# Patient Record
Sex: Female | Born: 1955
Health system: Southern US, Community
[De-identification: ages and names within clinical notes are randomized; demographics above are authoritative.]

## PROBLEM LIST (undated history)

## (undated) DIAGNOSIS — I2510 Atherosclerotic heart disease of native coronary artery without angina pectoris: Secondary | ICD-10-CM

## (undated) DIAGNOSIS — K648 Other hemorrhoids: Secondary | ICD-10-CM

## (undated) DIAGNOSIS — Q2381 Bicuspid aortic valve: Secondary | ICD-10-CM

## (undated) DIAGNOSIS — G43909 Migraine, unspecified, not intractable, without status migrainosus: Secondary | ICD-10-CM

## (undated) DIAGNOSIS — K828 Other specified diseases of gallbladder: Secondary | ICD-10-CM

## (undated) DIAGNOSIS — G4733 Obstructive sleep apnea (adult) (pediatric): Secondary | ICD-10-CM

## (undated) DIAGNOSIS — K0889 Other specified disorders of teeth and supporting structures: Secondary | ICD-10-CM

## (undated) DIAGNOSIS — T7840XA Allergy, unspecified, initial encounter: Secondary | ICD-10-CM

## (undated) DIAGNOSIS — J189 Pneumonia, unspecified organism: Secondary | ICD-10-CM

## (undated) DIAGNOSIS — I714 Abdominal aortic aneurysm, without rupture, unspecified: Secondary | ICD-10-CM

## (undated) DIAGNOSIS — N289 Disorder of kidney and ureter, unspecified: Secondary | ICD-10-CM

## (undated) DIAGNOSIS — F329 Major depressive disorder, single episode, unspecified: Secondary | ICD-10-CM

## (undated) DIAGNOSIS — R002 Palpitations: Secondary | ICD-10-CM

## (undated) DIAGNOSIS — I38 Endocarditis, valve unspecified: Secondary | ICD-10-CM

## (undated) DIAGNOSIS — J45909 Unspecified asthma, uncomplicated: Secondary | ICD-10-CM

## (undated) DIAGNOSIS — M1990 Unspecified osteoarthritis, unspecified site: Secondary | ICD-10-CM

## (undated) DIAGNOSIS — I5042 Chronic combined systolic (congestive) and diastolic (congestive) heart failure: Secondary | ICD-10-CM

## (undated) DIAGNOSIS — I119 Hypertensive heart disease without heart failure: Secondary | ICD-10-CM

## (undated) DIAGNOSIS — K589 Irritable bowel syndrome without diarrhea: Secondary | ICD-10-CM

## (undated) DIAGNOSIS — F32A Depression, unspecified: Secondary | ICD-10-CM

## (undated) DIAGNOSIS — F419 Anxiety disorder, unspecified: Secondary | ICD-10-CM

## (undated) DIAGNOSIS — R600 Localized edema: Secondary | ICD-10-CM

## (undated) DIAGNOSIS — H269 Unspecified cataract: Secondary | ICD-10-CM

## (undated) DIAGNOSIS — K5900 Constipation, unspecified: Secondary | ICD-10-CM

## (undated) DIAGNOSIS — I10 Essential (primary) hypertension: Secondary | ICD-10-CM

## (undated) DIAGNOSIS — E785 Hyperlipidemia, unspecified: Secondary | ICD-10-CM

## (undated) DIAGNOSIS — I4891 Unspecified atrial fibrillation: Secondary | ICD-10-CM

## (undated) DIAGNOSIS — I509 Heart failure, unspecified: Secondary | ICD-10-CM

## (undated) DIAGNOSIS — Z8679 Personal history of other diseases of the circulatory system: Secondary | ICD-10-CM

## (undated) DIAGNOSIS — M069 Rheumatoid arthritis, unspecified: Secondary | ICD-10-CM

## (undated) DIAGNOSIS — D649 Anemia, unspecified: Secondary | ICD-10-CM

## (undated) DIAGNOSIS — K219 Gastro-esophageal reflux disease without esophagitis: Secondary | ICD-10-CM

## (undated) DIAGNOSIS — F1010 Alcohol abuse, uncomplicated: Secondary | ICD-10-CM

## (undated) DIAGNOSIS — Q8740 Marfan's syndrome, unspecified: Secondary | ICD-10-CM

## (undated) DIAGNOSIS — Z9889 Other specified postprocedural states: Secondary | ICD-10-CM

## (undated) DIAGNOSIS — G4730 Sleep apnea, unspecified: Secondary | ICD-10-CM

## (undated) DIAGNOSIS — Q231 Congenital insufficiency of aortic valve: Secondary | ICD-10-CM

## (undated) DIAGNOSIS — R633 Feeding difficulties: Secondary | ICD-10-CM

## (undated) HISTORY — DX: Major depressive disorder, single episode, unspecified: F32.9

## (undated) HISTORY — DX: Anxiety disorder, unspecified: F41.9

## (undated) HISTORY — DX: Atherosclerotic heart disease of native coronary artery without angina pectoris: I25.10

## (undated) HISTORY — DX: Depression, unspecified: F32.A

## (undated) HISTORY — DX: Heart failure, unspecified: I50.9

## (undated) HISTORY — DX: Other specified postprocedural states: Z98.890

## (undated) HISTORY — DX: Anemia, unspecified: D64.9

## (undated) HISTORY — DX: Hyperlipidemia, unspecified: E78.5

## (undated) HISTORY — DX: Other specified diseases of gallbladder: K82.8

## (undated) HISTORY — DX: Gastro-esophageal reflux disease without esophagitis: K21.9

## (undated) HISTORY — DX: Pneumonia, unspecified organism: J18.9

## (undated) HISTORY — DX: Unspecified osteoarthritis, unspecified site: M19.90

## (undated) HISTORY — DX: Irritable bowel syndrome, unspecified: K58.9

## (undated) HISTORY — PX: COLONOSCOPY: SHX174

## (undated) HISTORY — DX: Bicuspid aortic valve: Q23.81

## (undated) HISTORY — DX: Obstructive sleep apnea (adult) (pediatric): G47.33

## (undated) HISTORY — DX: Chronic combined systolic (congestive) and diastolic (congestive) heart failure: I50.42

## (undated) HISTORY — DX: Other specified disorders of teeth and supporting structures: K08.89

## (undated) HISTORY — DX: Congenital insufficiency of aortic valve: Q23.1

## (undated) HISTORY — DX: Personal history of other diseases of the circulatory system: Z86.79

## (undated) HISTORY — DX: Hypertensive heart disease without heart failure: I11.9

## (undated) HISTORY — DX: Constipation, unspecified: K59.00

## (undated) HISTORY — DX: Essential (primary) hypertension: I10

## (undated) HISTORY — DX: Abdominal aortic aneurysm, without rupture, unspecified: I71.40

## (undated) HISTORY — DX: Alcohol abuse, uncomplicated: F10.10

## (undated) HISTORY — PX: ABDOMINAL AORTIC ANEURYSM REPAIR: SUR1152

## (undated) HISTORY — PX: HEMORRHOID BANDING: SHX5850

## (undated) HISTORY — PX: ORIF DISTAL RADIUS FRACTURE: SUR927

## (undated) HISTORY — DX: Disorder of kidney and ureter, unspecified: N28.9

## (undated) HISTORY — DX: Unspecified cataract: H26.9

## (undated) HISTORY — PX: FRACTURE SURGERY: SHX138

## (undated) HISTORY — DX: Other hemorrhoids: K64.8

## (undated) HISTORY — PX: CHOLECYSTECTOMY: SHX55

## (undated) HISTORY — DX: Allergy, unspecified, initial encounter: T78.40XA

## (undated) HISTORY — DX: Rheumatoid arthritis, unspecified: M06.9

## (undated) HISTORY — DX: Localized edema: R60.0

## (undated) HISTORY — DX: Abdominal aortic aneurysm, without rupture: I71.4

## (undated) HISTORY — DX: Endocarditis, valve unspecified: I38

## (undated) HISTORY — DX: Palpitations: R00.2

## (undated) HISTORY — PX: UPPER GASTROINTESTINAL ENDOSCOPY: SHX188

## (undated) HISTORY — DX: Sleep apnea, unspecified: G47.30

## (undated) HISTORY — DX: Marfan syndrome, unspecified: Q87.40

## (undated) SURGERY — CARDIOVERSION
Anesthesia: General

---
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
## 1999-12-21 HISTORY — PX: CARDIAC CATHETERIZATION: SHX172

## 2002-03-08 ENCOUNTER — Encounter: Payer: Self-pay | Admitting: Family Medicine

## 2004-09-21 ENCOUNTER — Encounter: Payer: Self-pay | Admitting: Pulmonary Disease

## 2004-09-21 ENCOUNTER — Ambulatory Visit: Payer: Self-pay | Admitting: Internal Medicine

## 2005-03-10 ENCOUNTER — Encounter: Payer: Self-pay | Admitting: Internal Medicine

## 2005-03-20 ENCOUNTER — Encounter: Payer: Self-pay | Admitting: Internal Medicine

## 2005-04-19 ENCOUNTER — Encounter: Payer: Self-pay | Admitting: Internal Medicine

## 2005-05-20 ENCOUNTER — Encounter: Payer: Self-pay | Admitting: Internal Medicine

## 2005-06-16 ENCOUNTER — Observation Stay (HOSPITAL_COMMUNITY): Admission: EM | Admit: 2005-06-16 | Discharge: 2005-06-17 | Payer: Self-pay | Admitting: Emergency Medicine

## 2005-06-17 ENCOUNTER — Ambulatory Visit: Payer: Self-pay

## 2005-06-19 ENCOUNTER — Encounter: Payer: Self-pay | Admitting: Internal Medicine

## 2005-07-20 ENCOUNTER — Encounter: Payer: Self-pay | Admitting: Internal Medicine

## 2006-02-10 ENCOUNTER — Encounter: Payer: Self-pay | Admitting: Emergency Medicine

## 2006-02-11 ENCOUNTER — Inpatient Hospital Stay (HOSPITAL_COMMUNITY): Admission: EM | Admit: 2006-02-11 | Discharge: 2006-02-11 | Payer: Self-pay | Admitting: Internal Medicine

## 2006-02-11 ENCOUNTER — Ambulatory Visit: Payer: Self-pay | Admitting: Cardiology

## 2006-04-26 ENCOUNTER — Ambulatory Visit: Payer: Self-pay | Admitting: Cardiology

## 2006-11-01 ENCOUNTER — Ambulatory Visit: Payer: Self-pay | Admitting: Internal Medicine

## 2006-11-15 ENCOUNTER — Encounter (INDEPENDENT_AMBULATORY_CARE_PROVIDER_SITE_OTHER): Payer: Self-pay | Admitting: Specialist

## 2006-11-15 ENCOUNTER — Ambulatory Visit: Payer: Self-pay | Admitting: Internal Medicine

## 2006-12-22 ENCOUNTER — Ambulatory Visit: Payer: Self-pay | Admitting: Internal Medicine

## 2006-12-29 ENCOUNTER — Ambulatory Visit: Payer: Self-pay | Admitting: Family Medicine

## 2006-12-29 LAB — CONVERTED CEMR LAB
ALT: 21 units/L (ref 0–40)
AST: 19 units/L (ref 0–37)
Albumin: 3.8 g/dL (ref 3.5–5.2)
BUN: 12 mg/dL (ref 6–23)
Basophils Absolute: 0 10*3/uL (ref 0.0–0.1)
Basophils Relative: 0.4 % (ref 0.0–1.0)
CO2: 29 meq/L (ref 19–32)
Calcium: 9.2 mg/dL (ref 8.4–10.5)
Chloride: 100 meq/L (ref 96–112)
Chol/HDL Ratio, serum: 5.2
Cholesterol: 233 mg/dL (ref 0–200)
Creatinine, Ser: 0.8 mg/dL (ref 0.4–1.2)
Eosinophil percent: 2.3 % (ref 0.0–5.0)
GFR calc non Af Amer: 81 mL/min
Glomerular Filtration Rate, Af Am: 98 mL/min/{1.73_m2}
Glucose, Bld: 90 mg/dL (ref 70–99)
HCT: 39.3 % (ref 36.0–46.0)
HDL: 45 mg/dL (ref >39.0)
Hemoglobin: 13 g/dL (ref 12.0–15.0)
LDL DIRECT: 158.3 mg/dL
Lymphocytes Relative: 25 % (ref 12.0–46.0)
MCHC: 33.2 g/dL (ref 30.0–36.0)
MCV: 92.9 fL (ref 78.0–100.0)
Monocytes Absolute: 0.4 10*3/uL (ref 0.2–0.7)
Monocytes Relative: 6.7 % (ref 3.0–11.0)
Neutro Abs: 4.2 10*3/uL (ref 1.4–7.7)
Neutrophils Relative %: 65.6 % (ref 43.0–77.0)
Phosphorus Concentration: 4.5 mg/dL (ref 2.3–4.6)
Platelets: 310 10*3/uL (ref 150–400)
Potassium: 3.7 meq/L (ref 3.5–5.1)
RBC: 4.23 M/uL (ref 3.87–5.11)
RDW: 13.1 % (ref 11.5–14.6)
Sodium: 137 meq/L (ref 135–145)
TSH: 0.53 microintl units/mL (ref 0.35–5.50)
Triglyceride fasting, serum: 110 mg/dL (ref 0–149)
VLDL: 22 mg/dL (ref 0–40)
WBC: 6.2 10*3/uL (ref 4.5–10.5)

## 2006-12-30 ENCOUNTER — Ambulatory Visit: Payer: Self-pay | Admitting: Family Medicine

## 2007-01-30 ENCOUNTER — Ambulatory Visit: Payer: Self-pay | Admitting: Family Medicine

## 2007-02-07 ENCOUNTER — Ambulatory Visit: Payer: Self-pay | Admitting: Family Medicine

## 2007-02-15 ENCOUNTER — Ambulatory Visit: Payer: Self-pay | Admitting: Obstetrics & Gynecology

## 2007-02-15 ENCOUNTER — Encounter: Payer: Self-pay | Admitting: Obstetrics & Gynecology

## 2007-03-15 ENCOUNTER — Ambulatory Visit: Payer: Self-pay | Admitting: Obstetrics & Gynecology

## 2007-05-01 ENCOUNTER — Telehealth (INDEPENDENT_AMBULATORY_CARE_PROVIDER_SITE_OTHER): Payer: Self-pay | Admitting: *Deleted

## 2007-05-01 ENCOUNTER — Ambulatory Visit: Payer: Self-pay | Admitting: Family Medicine

## 2007-05-02 LAB — CONVERTED CEMR LAB
ALT: 23 units/L (ref 0–40)
AST: 17 units/L (ref 0–37)
Cholesterol: 243 mg/dL (ref 0–200)
Direct LDL: 162.6 mg/dL
HDL: 39.9 mg/dL (ref >39.0)
Total CHOL/HDL Ratio: 6.1
Triglycerides: 155 mg/dL — ABNORMAL HIGH (ref 0–149)
VLDL: 31 mg/dL (ref 0–40)

## 2007-07-26 DIAGNOSIS — R011 Cardiac murmur, unspecified: Secondary | ICD-10-CM | POA: Insufficient documentation

## 2007-07-26 DIAGNOSIS — J45909 Unspecified asthma, uncomplicated: Secondary | ICD-10-CM | POA: Insufficient documentation

## 2007-07-26 DIAGNOSIS — E785 Hyperlipidemia, unspecified: Secondary | ICD-10-CM | POA: Insufficient documentation

## 2007-07-26 DIAGNOSIS — I209 Angina pectoris, unspecified: Secondary | ICD-10-CM | POA: Insufficient documentation

## 2007-07-26 DIAGNOSIS — J4520 Mild intermittent asthma, uncomplicated: Secondary | ICD-10-CM | POA: Insufficient documentation

## 2007-07-26 DIAGNOSIS — G43109 Migraine with aura, not intractable, without status migrainosus: Secondary | ICD-10-CM | POA: Insufficient documentation

## 2007-07-26 DIAGNOSIS — F418 Other specified anxiety disorders: Secondary | ICD-10-CM | POA: Insufficient documentation

## 2007-07-26 DIAGNOSIS — M1990 Unspecified osteoarthritis, unspecified site: Secondary | ICD-10-CM | POA: Insufficient documentation

## 2007-07-26 DIAGNOSIS — I499 Cardiac arrhythmia, unspecified: Secondary | ICD-10-CM | POA: Insufficient documentation

## 2007-07-26 DIAGNOSIS — K219 Gastro-esophageal reflux disease without esophagitis: Secondary | ICD-10-CM | POA: Insufficient documentation

## 2007-07-26 DIAGNOSIS — I10 Essential (primary) hypertension: Secondary | ICD-10-CM | POA: Insufficient documentation

## 2007-07-26 DIAGNOSIS — J309 Allergic rhinitis, unspecified: Secondary | ICD-10-CM | POA: Insufficient documentation

## 2007-07-28 ENCOUNTER — Ambulatory Visit: Payer: Self-pay | Admitting: Family Medicine

## 2007-07-31 ENCOUNTER — Telehealth: Payer: Self-pay | Admitting: Family Medicine

## 2007-08-29 ENCOUNTER — Telehealth (INDEPENDENT_AMBULATORY_CARE_PROVIDER_SITE_OTHER): Payer: Self-pay | Admitting: *Deleted

## 2007-09-01 ENCOUNTER — Ambulatory Visit: Payer: Self-pay | Admitting: Family Medicine

## 2007-12-01 ENCOUNTER — Telehealth: Payer: Self-pay | Admitting: Family Medicine

## 2007-12-07 ENCOUNTER — Encounter (INDEPENDENT_AMBULATORY_CARE_PROVIDER_SITE_OTHER): Payer: Self-pay | Admitting: *Deleted

## 2007-12-28 ENCOUNTER — Telehealth: Payer: Self-pay | Admitting: Family Medicine

## 2008-01-12 ENCOUNTER — Telehealth: Payer: Self-pay | Admitting: Family Medicine

## 2008-02-08 ENCOUNTER — Telehealth: Payer: Self-pay | Admitting: Family Medicine

## 2008-03-01 ENCOUNTER — Telehealth: Payer: Self-pay | Admitting: Family Medicine

## 2008-04-02 ENCOUNTER — Ambulatory Visit: Payer: Self-pay | Admitting: Family Medicine

## 2008-04-02 DIAGNOSIS — G4700 Insomnia, unspecified: Secondary | ICD-10-CM | POA: Insufficient documentation

## 2008-05-22 ENCOUNTER — Telehealth: Payer: Self-pay | Admitting: Family Medicine

## 2008-06-10 ENCOUNTER — Telehealth: Payer: Self-pay | Admitting: Family Medicine

## 2008-07-02 ENCOUNTER — Emergency Department: Payer: Self-pay | Admitting: Emergency Medicine

## 2008-07-02 ENCOUNTER — Emergency Department (HOSPITAL_COMMUNITY): Admission: EM | Admit: 2008-07-02 | Discharge: 2008-07-03 | Payer: Self-pay | Admitting: Emergency Medicine

## 2008-07-04 ENCOUNTER — Inpatient Hospital Stay (HOSPITAL_COMMUNITY): Admission: RE | Admit: 2008-07-04 | Discharge: 2008-07-08 | Payer: Self-pay | Admitting: Orthopedic Surgery

## 2008-07-30 ENCOUNTER — Telehealth (INDEPENDENT_AMBULATORY_CARE_PROVIDER_SITE_OTHER): Payer: Self-pay | Admitting: *Deleted

## 2008-09-25 ENCOUNTER — Telehealth: Payer: Self-pay | Admitting: Family Medicine

## 2008-11-17 ENCOUNTER — Observation Stay: Payer: Self-pay | Admitting: Unknown Physician Specialty

## 2008-11-20 ENCOUNTER — Encounter: Payer: Self-pay | Admitting: Family Medicine

## 2008-11-28 ENCOUNTER — Telehealth: Payer: Self-pay | Admitting: Family Medicine

## 2008-12-02 ENCOUNTER — Telehealth: Payer: Self-pay | Admitting: Family Medicine

## 2008-12-02 DIAGNOSIS — Z780 Asymptomatic menopausal state: Secondary | ICD-10-CM | POA: Insufficient documentation

## 2008-12-16 ENCOUNTER — Telehealth: Payer: Self-pay | Admitting: Family Medicine

## 2008-12-30 ENCOUNTER — Encounter: Payer: Self-pay | Admitting: Family Medicine

## 2009-01-07 ENCOUNTER — Encounter (INDEPENDENT_AMBULATORY_CARE_PROVIDER_SITE_OTHER): Payer: Self-pay | Admitting: *Deleted

## 2009-01-09 ENCOUNTER — Ambulatory Visit: Payer: Self-pay | Admitting: Family Medicine

## 2009-01-09 DIAGNOSIS — M810 Age-related osteoporosis without current pathological fracture: Secondary | ICD-10-CM | POA: Insufficient documentation

## 2009-01-10 ENCOUNTER — Encounter (INDEPENDENT_AMBULATORY_CARE_PROVIDER_SITE_OTHER): Payer: Self-pay | Admitting: *Deleted

## 2009-01-10 LAB — CONVERTED CEMR LAB
ALT: 21 units/L (ref 0–35)
AST: 22 units/L (ref 0–37)
Albumin: 4.1 g/dL (ref 3.5–5.2)
Alkaline Phosphatase: 70 units/L (ref 39–117)
BUN: 14 mg/dL (ref 6–23)
Basophils Absolute: 0 10*3/uL (ref 0.0–0.1)
Basophils Relative: 0.1 % (ref 0.0–3.0)
Bilirubin, Direct: 0.1 mg/dL (ref 0.0–0.3)
CO2: 28 meq/L (ref 19–32)
Calcium: 9.1 mg/dL (ref 8.4–10.5)
Chloride: 107 meq/L (ref 96–112)
Cholesterol: 212 mg/dL (ref 0–200)
Creatinine, Ser: 0.8 mg/dL (ref 0.4–1.2)
Direct LDL: 146.4 mg/dL
Eosinophils Absolute: 0.2 10*3/uL (ref 0.0–0.7)
Eosinophils Relative: 2.5 % (ref 0.0–5.0)
GFR calc Af Amer: 97 mL/min
GFR calc non Af Amer: 80 mL/min
Glucose, Bld: 98 mg/dL (ref 70–99)
HCT: 37.8 % (ref 36.0–46.0)
HDL: 44.6 mg/dL (ref >39.0)
Hemoglobin: 12.9 g/dL (ref 12.0–15.0)
Lymphocytes Relative: 37.5 % (ref 12.0–46.0)
MCHC: 34.3 g/dL (ref 30.0–36.0)
MCV: 92 fL (ref 78.0–100.0)
Monocytes Absolute: 0.6 10*3/uL (ref 0.1–1.0)
Monocytes Relative: 10.2 % (ref 3.0–12.0)
Neutro Abs: 3 10*3/uL (ref 1.4–7.7)
Neutrophils Relative %: 49.7 % (ref 43.0–77.0)
Platelets: 288 10*3/uL (ref 150–400)
Potassium: 3.9 meq/L (ref 3.5–5.1)
RBC: 4.11 M/uL (ref 3.87–5.11)
RDW: 13.5 % (ref 11.5–14.6)
Sodium: 140 meq/L (ref 135–145)
TSH: 1.18 microintl units/mL (ref 0.35–5.50)
Total Bilirubin: 0.9 mg/dL (ref 0.3–1.2)
Total CHOL/HDL Ratio: 4.8
Total Protein: 7.6 g/dL (ref 6.0–8.3)
Triglycerides: 92 mg/dL (ref 0–149)
VLDL: 18 mg/dL (ref 0–40)
WBC: 6.1 10*3/uL (ref 4.5–10.5)

## 2009-01-15 LAB — CONVERTED CEMR LAB: Vit D, 1,25-Dihydroxy: 13 — ABNORMAL LOW (ref 30–89)

## 2009-01-27 ENCOUNTER — Ambulatory Visit: Payer: Self-pay | Admitting: Family Medicine

## 2009-02-04 ENCOUNTER — Encounter: Payer: Self-pay | Admitting: Unknown Physician Specialty

## 2009-02-17 ENCOUNTER — Encounter: Payer: Self-pay | Admitting: Unknown Physician Specialty

## 2009-03-05 ENCOUNTER — Telehealth: Payer: Self-pay | Admitting: Family Medicine

## 2009-03-20 ENCOUNTER — Encounter: Payer: Self-pay | Admitting: Unknown Physician Specialty

## 2009-05-05 ENCOUNTER — Ambulatory Visit: Payer: Self-pay | Admitting: Family Medicine

## 2009-05-06 LAB — CONVERTED CEMR LAB
ALT: 17 units/L (ref 0–35)
AST: 19 units/L (ref 0–37)
Cholesterol: 224 mg/dL — ABNORMAL HIGH (ref 0–200)
Direct LDL: 163.3 mg/dL
HDL: 38.6 mg/dL — ABNORMAL LOW (ref >39.00)
Total CHOL/HDL Ratio: 6
Triglycerides: 146 mg/dL (ref 0.0–149.0)
VLDL: 29.2 mg/dL (ref 0.0–40.0)

## 2009-05-08 LAB — CONVERTED CEMR LAB: Vit D, 25-Hydroxy: 28 ng/mL — ABNORMAL LOW (ref 30–89)

## 2009-06-12 ENCOUNTER — Telehealth: Payer: Self-pay | Admitting: Family Medicine

## 2009-06-26 ENCOUNTER — Ambulatory Visit: Payer: Self-pay | Admitting: Family Medicine

## 2009-06-26 DIAGNOSIS — K644 Residual hemorrhoidal skin tags: Secondary | ICD-10-CM | POA: Insufficient documentation

## 2009-07-08 ENCOUNTER — Telehealth: Payer: Self-pay | Admitting: Family Medicine

## 2009-07-18 ENCOUNTER — Ambulatory Visit: Payer: Self-pay | Admitting: Family Medicine

## 2009-07-21 ENCOUNTER — Encounter: Payer: Self-pay | Admitting: Family Medicine

## 2009-07-28 ENCOUNTER — Telehealth: Payer: Self-pay | Admitting: Family Medicine

## 2009-08-04 ENCOUNTER — Ambulatory Visit: Payer: Self-pay | Admitting: Family Medicine

## 2009-08-04 DIAGNOSIS — G4733 Obstructive sleep apnea (adult) (pediatric): Secondary | ICD-10-CM | POA: Insufficient documentation

## 2009-08-12 ENCOUNTER — Ambulatory Visit: Payer: Self-pay | Admitting: Pulmonary Disease

## 2009-08-12 DIAGNOSIS — G4700 Insomnia, unspecified: Secondary | ICD-10-CM | POA: Insufficient documentation

## 2009-08-27 ENCOUNTER — Encounter (INDEPENDENT_AMBULATORY_CARE_PROVIDER_SITE_OTHER): Payer: Self-pay | Admitting: *Deleted

## 2009-08-27 LAB — CONVERTED CEMR LAB
ALT: 21 units/L (ref 0–35)
AST: 21 units/L (ref 0–37)
Albumin: 3.8 g/dL (ref 3.5–5.2)
Alkaline Phosphatase: 73 units/L (ref 39–117)
BUN: 11 mg/dL (ref 6–23)
Bilirubin, Direct: 0 mg/dL (ref 0.0–0.3)
CO2: 28 meq/L (ref 19–32)
Calcium: 9.2 mg/dL (ref 8.4–10.5)
Chloride: 105 meq/L (ref 96–112)
Creatinine, Ser: 0.8 mg/dL (ref 0.4–1.2)
Glucose, Bld: 102 mg/dL — ABNORMAL HIGH (ref 70–99)
Phosphorus: 3.3 mg/dL (ref 2.3–4.6)
Potassium: 3.4 meq/L — ABNORMAL LOW (ref 3.5–5.1)
Sodium: 140 meq/L (ref 135–145)
Total Bilirubin: 0.7 mg/dL (ref 0.3–1.2)
Total Protein: 7.5 g/dL (ref 6.0–8.3)

## 2009-09-09 ENCOUNTER — Ambulatory Visit: Payer: Self-pay | Admitting: Family Medicine

## 2009-09-09 DIAGNOSIS — K589 Irritable bowel syndrome without diarrhea: Secondary | ICD-10-CM | POA: Insufficient documentation

## 2009-09-15 ENCOUNTER — Encounter (INDEPENDENT_AMBULATORY_CARE_PROVIDER_SITE_OTHER): Payer: Self-pay | Admitting: *Deleted

## 2010-08-23 ENCOUNTER — Encounter: Payer: Self-pay | Admitting: Cardiovascular Disease

## 2010-08-23 ENCOUNTER — Encounter: Payer: Self-pay | Admitting: Family Medicine

## 2010-08-23 ENCOUNTER — Emergency Department: Payer: Self-pay | Admitting: Unknown Physician Specialty

## 2010-08-24 DIAGNOSIS — Z8679 Personal history of other diseases of the circulatory system: Secondary | ICD-10-CM | POA: Insufficient documentation

## 2010-08-24 DIAGNOSIS — Z9889 Other specified postprocedural states: Secondary | ICD-10-CM | POA: Insufficient documentation

## 2010-08-24 DIAGNOSIS — F329 Major depressive disorder, single episode, unspecified: Secondary | ICD-10-CM | POA: Insufficient documentation

## 2010-08-26 ENCOUNTER — Encounter: Payer: Self-pay | Admitting: Cardiovascular Disease

## 2010-08-26 ENCOUNTER — Encounter: Payer: Self-pay | Admitting: Family Medicine

## 2010-09-10 ENCOUNTER — Encounter: Payer: Self-pay | Admitting: Cardiovascular Disease

## 2010-09-10 ENCOUNTER — Encounter: Payer: Self-pay | Admitting: Family Medicine

## 2010-09-16 ENCOUNTER — Encounter: Payer: Self-pay | Admitting: Family Medicine

## 2010-09-16 ENCOUNTER — Encounter: Payer: Self-pay | Admitting: Cardiovascular Disease

## 2010-09-17 ENCOUNTER — Encounter: Payer: Self-pay | Admitting: Family Medicine

## 2010-09-23 ENCOUNTER — Encounter: Payer: Self-pay | Admitting: Family Medicine

## 2010-10-07 ENCOUNTER — Encounter: Payer: Self-pay | Admitting: Family Medicine

## 2010-10-08 ENCOUNTER — Ambulatory Visit: Payer: Self-pay | Admitting: Family Medicine

## 2010-10-13 ENCOUNTER — Encounter: Payer: Self-pay | Admitting: Cardiovascular Disease

## 2010-10-21 ENCOUNTER — Encounter: Payer: Self-pay | Admitting: Family Medicine

## 2010-11-04 ENCOUNTER — Encounter: Payer: Self-pay | Admitting: Family Medicine

## 2010-11-16 ENCOUNTER — Encounter: Payer: Self-pay | Admitting: Cardiothoracic Surgery

## 2010-11-19 ENCOUNTER — Encounter: Payer: Self-pay | Admitting: Cardiothoracic Surgery

## 2010-12-02 ENCOUNTER — Encounter: Payer: Self-pay | Admitting: Family Medicine

## 2010-12-06 ENCOUNTER — Encounter: Payer: Self-pay | Admitting: Cardiovascular Disease

## 2010-12-07 ENCOUNTER — Encounter: Payer: Self-pay | Admitting: Cardiovascular Disease

## 2010-12-11 ENCOUNTER — Ambulatory Visit: Payer: Self-pay | Admitting: Family Medicine

## 2010-12-11 DIAGNOSIS — I71019 Dissection of thoracic aorta, unspecified: Secondary | ICD-10-CM | POA: Insufficient documentation

## 2010-12-11 DIAGNOSIS — I714 Abdominal aortic aneurysm, without rupture, unspecified: Secondary | ICD-10-CM | POA: Insufficient documentation

## 2010-12-11 DIAGNOSIS — K829 Disease of gallbladder, unspecified: Secondary | ICD-10-CM | POA: Insufficient documentation

## 2010-12-11 DIAGNOSIS — R5381 Other malaise: Secondary | ICD-10-CM | POA: Insufficient documentation

## 2010-12-11 DIAGNOSIS — I7101 Dissection of thoracic aorta: Secondary | ICD-10-CM | POA: Insufficient documentation

## 2010-12-11 DIAGNOSIS — R5383 Other fatigue: Secondary | ICD-10-CM | POA: Insufficient documentation

## 2010-12-15 ENCOUNTER — Telehealth: Payer: Self-pay | Admitting: Family Medicine

## 2010-12-15 ENCOUNTER — Ambulatory Visit
Admission: RE | Admit: 2010-12-15 | Discharge: 2010-12-15 | Payer: Self-pay | Source: Home / Self Care | Attending: Family Medicine | Admitting: Family Medicine

## 2010-12-15 DIAGNOSIS — M549 Dorsalgia, unspecified: Secondary | ICD-10-CM | POA: Insufficient documentation

## 2010-12-15 DIAGNOSIS — G8929 Other chronic pain: Secondary | ICD-10-CM | POA: Insufficient documentation

## 2010-12-15 LAB — CONVERTED CEMR LAB
Bacteria, UA: 0
Bilirubin Urine: NEGATIVE
Casts: 0 /lpf
Glucose, Urine, Semiquant: NEGATIVE
Ketones, urine, test strip: NEGATIVE
Nitrite: NEGATIVE
RBC / HPF: 0
Specific Gravity, Urine: 1.01
Urine crystals, microscopic: 0 /hpf
Urobilinogen, UA: 0.2
WBC Urine, dipstick: NEGATIVE
WBC, UA: 0 cells/hpf
Yeast, UA: 0
pH: 6

## 2010-12-16 LAB — CONVERTED CEMR LAB
ALT: 12 units/L (ref 0–35)
AST: 12 units/L (ref 0–37)
Albumin: 3.5 g/dL (ref 3.5–5.2)
Alkaline Phosphatase: 89 units/L (ref 39–117)
BUN: 13 mg/dL (ref 6–23)
Basophils Absolute: 0 10*3/uL (ref 0.0–0.1)
Basophils Relative: 0.4 % (ref 0.0–3.0)
Bilirubin, Direct: 0.1 mg/dL (ref 0.0–0.3)
CO2: 27 meq/L (ref 19–32)
Calcium: 8.9 mg/dL (ref 8.4–10.5)
Chloride: 100 meq/L (ref 96–112)
Cholesterol: 188 mg/dL (ref 0–200)
Creatinine, Ser: 0.7 mg/dL (ref 0.4–1.2)
Eosinophils Absolute: 0.1 10*3/uL (ref 0.0–0.7)
Eosinophils Relative: 1.5 % (ref 0.0–5.0)
Ferritin: 105 ng/mL (ref 10.0–291.0)
Folate: 7.8 ng/mL
Free T4: 0.71 ng/dL (ref 0.60–1.60)
GFR calc non Af Amer: 90.01 mL/min (ref >60.00)
Glucose, Bld: 73 mg/dL (ref 70–99)
HCT: 32.5 % — ABNORMAL LOW (ref 36.0–46.0)
HDL: 39.4 mg/dL (ref >39.00)
Hemoglobin: 10.9 g/dL — ABNORMAL LOW (ref 12.0–15.0)
LDL Cholesterol: 127 mg/dL — ABNORMAL HIGH (ref 0–99)
Lymphocytes Relative: 22 % (ref 12.0–46.0)
Lymphs Abs: 1.9 10*3/uL (ref 0.7–4.0)
MCHC: 33.7 g/dL (ref 30.0–36.0)
MCV: 91.5 fL (ref 78.0–100.0)
Monocytes Absolute: 0.8 10*3/uL (ref 0.1–1.0)
Monocytes Relative: 9 % (ref 3.0–12.0)
Neutro Abs: 5.8 10*3/uL (ref 1.4–7.7)
Neutrophils Relative %: 67.1 % (ref 43.0–77.0)
Phosphorus: 4.3 mg/dL (ref 2.3–4.6)
Platelets: 320 10*3/uL (ref 150.0–400.0)
Potassium: 3.8 meq/L (ref 3.5–5.1)
RBC: 3.55 M/uL — ABNORMAL LOW (ref 3.87–5.11)
RDW: 15.1 % — ABNORMAL HIGH (ref 11.5–14.6)
Sodium: 136 meq/L (ref 135–145)
TSH: 1.69 microintl units/mL (ref 0.35–5.50)
Total Bilirubin: 0.4 mg/dL (ref 0.3–1.2)
Total CHOL/HDL Ratio: 5
Total Protein: 7.6 g/dL (ref 6.0–8.3)
Triglycerides: 108 mg/dL (ref 0.0–149.0)
VLDL: 21.6 mg/dL (ref 0.0–40.0)
Vitamin B-12: 696 pg/mL (ref 211–911)
WBC: 8.7 10*3/uL (ref 4.5–10.5)

## 2010-12-18 ENCOUNTER — Telehealth: Payer: Self-pay | Admitting: Family Medicine

## 2010-12-20 ENCOUNTER — Encounter: Payer: Self-pay | Admitting: Cardiothoracic Surgery

## 2010-12-20 DIAGNOSIS — M1990 Unspecified osteoarthritis, unspecified site: Secondary | ICD-10-CM | POA: Insufficient documentation

## 2010-12-22 ENCOUNTER — Ambulatory Visit
Admission: RE | Admit: 2010-12-22 | Discharge: 2010-12-22 | Payer: Self-pay | Source: Home / Self Care | Attending: Family Medicine | Admitting: Family Medicine

## 2010-12-25 ENCOUNTER — Encounter: Payer: Self-pay | Admitting: Family Medicine

## 2011-01-05 ENCOUNTER — Encounter: Payer: Self-pay | Admitting: Family Medicine

## 2011-01-05 ENCOUNTER — Telehealth: Payer: Self-pay | Admitting: Family Medicine

## 2011-01-06 ENCOUNTER — Ambulatory Visit
Admission: RE | Admit: 2011-01-06 | Discharge: 2011-01-06 | Payer: Self-pay | Source: Home / Self Care | Attending: Family Medicine | Admitting: Family Medicine

## 2011-01-06 ENCOUNTER — Encounter: Payer: Self-pay | Admitting: Family Medicine

## 2011-01-06 DIAGNOSIS — R42 Dizziness and giddiness: Secondary | ICD-10-CM | POA: Insufficient documentation

## 2011-01-07 ENCOUNTER — Encounter: Payer: Self-pay | Admitting: Cardiovascular Disease

## 2011-01-07 ENCOUNTER — Ambulatory Visit
Admission: RE | Admit: 2011-01-07 | Discharge: 2011-01-07 | Payer: Self-pay | Source: Home / Self Care | Attending: Cardiovascular Disease | Admitting: Cardiovascular Disease

## 2011-01-07 DIAGNOSIS — Z951 Presence of aortocoronary bypass graft: Secondary | ICD-10-CM | POA: Insufficient documentation

## 2011-01-07 DIAGNOSIS — Z952 Presence of prosthetic heart valve: Secondary | ICD-10-CM | POA: Insufficient documentation

## 2011-01-14 ENCOUNTER — Encounter: Payer: Self-pay | Admitting: Orthopedic Surgery

## 2011-01-14 ENCOUNTER — Other Ambulatory Visit: Payer: Self-pay | Admitting: Cardiovascular Disease

## 2011-01-14 ENCOUNTER — Ambulatory Visit: Admission: RE | Admit: 2011-01-14 | Discharge: 2011-01-14 | Payer: Self-pay | Source: Home / Self Care

## 2011-01-18 ENCOUNTER — Other Ambulatory Visit: Payer: Self-pay | Admitting: Family Medicine

## 2011-01-18 ENCOUNTER — Telehealth: Payer: Self-pay | Admitting: Family Medicine

## 2011-01-18 ENCOUNTER — Encounter: Payer: Self-pay | Admitting: Family Medicine

## 2011-01-18 ENCOUNTER — Ambulatory Visit
Admission: RE | Admit: 2011-01-18 | Discharge: 2011-01-18 | Payer: Self-pay | Source: Home / Self Care | Attending: Family Medicine | Admitting: Family Medicine

## 2011-01-18 DIAGNOSIS — D649 Anemia, unspecified: Secondary | ICD-10-CM | POA: Insufficient documentation

## 2011-01-18 LAB — CBC WITH DIFFERENTIAL/PLATELET
Basophils Absolute: 0 10*3/uL (ref 0.0–0.1)
Basophils Relative: 0.6 % (ref 0.0–3.0)
Eosinophils Absolute: 0.3 10*3/uL (ref 0.0–0.7)
Eosinophils Relative: 5 % (ref 0.0–5.0)
HCT: 33.5 % — ABNORMAL LOW (ref 36.0–46.0)
Hemoglobin: 11.4 g/dL — ABNORMAL LOW (ref 12.0–15.0)
Lymphocytes Relative: 34.3 % (ref 12.0–46.0)
Lymphs Abs: 2.2 10*3/uL (ref 0.7–4.0)
MCHC: 34.2 g/dL (ref 30.0–36.0)
MCV: 90.6 fl (ref 78.0–100.0)
Monocytes Absolute: 0.6 10*3/uL (ref 0.1–1.0)
Monocytes Relative: 8.9 % (ref 3.0–12.0)
Neutro Abs: 3.3 10*3/uL (ref 1.4–7.7)
Neutrophils Relative %: 51.2 % (ref 43.0–77.0)
Platelets: 270 10*3/uL (ref 150.0–400.0)
RBC: 3.7 Mil/uL — ABNORMAL LOW (ref 3.87–5.11)
RDW: 14.9 % — ABNORMAL HIGH (ref 11.5–14.6)
WBC: 6.5 10*3/uL (ref 4.5–10.5)

## 2011-01-19 NOTE — Assessment & Plan Note (Signed)
Summary: [facility] flu shot/rbh  Nurse Visit   Allergies: 1)  Asa 2)  Prednisone 3)  Fosamax  Orders Added: 1)  Admin 1st Vaccine [90471] 2)  Flu Vaccine 72yrs + [60454]        Flu Vaccine Consent Questions     Do you have a history of severe allergic reactions to this vaccine? no    Any prior history of allergic reactions to egg and/or gelatin? no    Do you have a sensitivity to the preservative Thimersol? no    Do you have a past history of Guillan-Barre Syndrome? no    Do you currently have an acute febrile illness? no    Have you ever had a severe reaction to latex? no    Vaccine information given and explained to patient? yes    Are you currently pregnant? no    Lot Number:[id number]   Exp Date:06/19/2011   Site Given  Left Deltoid IM1 [name] LPN  October 08, 2010 11:05 AM

## 2011-01-20 ENCOUNTER — Encounter: Payer: Self-pay | Admitting: Cardiothoracic Surgery

## 2011-01-20 ENCOUNTER — Encounter: Payer: Self-pay | Admitting: Orthopedic Surgery

## 2011-01-21 NOTE — Letter (Signed)
Summary: [facility] CT Surgery  [facility] CT Surgery   Imported By: Lester [place] 12/11/2010 11:36:45  _____________________________________________________________________  External Attachment:    Type:   Image     Comment:   External Document

## 2011-01-21 NOTE — Op Note (Signed)
Summary: [facility] CT Surgery  [facility] CT Surgery   Imported By: Lester [place] 12/11/2010 11:45:01  _____________________________________________________________________  External Attachment:    Type:   Image     Comment:   External Document

## 2011-01-21 NOTE — Progress Notes (Signed)
Summary: regarding back pain  Phone Note Call from Patient Call back at [phone]   Caller: Patient Call For: [name] MD Summary of Call: Pt was seen on 12/23 with right side pain.  She now has pain in the left side.  Unable to sleep last night after taking 2 percocet.  She is taking ibuprofen in between.  She doesnt want to take too much of that,  due to it causing stomach upset.  Please advise on what she should do. Initial call taken by: Lowella Petties CMA, AAMA,  December 18, 2010 8:51 AM  Follow-up for Phone Call        did her cardiac surgeons tell her that the back pain was from the aneurysm or something else? ? history of back pain from muscle spasm or disc problem in the past  let me know please  Follow-up by: [name] MD,  December 18, 2010 11:48 AM  Additional Follow-up for Phone Call Additional follow up Details #1::        Patient notified as instructed by telephone. Pt said cardiac surgeons were not sure what was causing the back pain, did not think related to aneurysm. Pt has had hx of back problems for 30 yrs, not sure if this pain is related to that type muscle spasm or disc problem. Pt said it just hurts on lower back both sides.Please advise. [name] LPN  December 18, 2010 1:08 PM     Additional Follow-up for Phone Call Additional follow up Details #2::    I think we should try flexeril as a muscle relaxer and then f/u next week to evaluate more fully and do some x rays -- to see what she is dealing with try some gentle heat and stretching  if severe at any time - go to ER  px written on EMR for call in - watch for sedation  Follow-up by: [name] MD,  December 18, 2010 1:15 PM  Additional Follow-up for Phone Call Additional follow up Details #3:: Details for Additional Follow-up Action Taken: Patient notified as instructed by telephone. Pt scheduled appt with Dr Tower01/03/11 at 3:30pm. Medication phoned to [facility] pharmacy as instructed.  [name] LPN  December 18, 2010 2:38 PM   New/Updated Medications: FLEXERIL 10 MG TABS (CYCLOBENZAPRINE HCL) 1 by mouth up to three times a day as needed back pain warn- can sedate Prescriptions: FLEXERIL 10 MG TABS (CYCLOBENZAPRINE HCL) 1 by mouth up to three times a day as needed back pain warn- can sedate  #30 x 0   Entered and Authorized by:   [name] MD   Signed by:   [name] LPN on 45/40/9811   Method used:   Telephoned to ...       [facility]* (retail)       [address] [place], [place]  [address]       Ph: [phone]       Fax: [phone]   RxID:   [id number]

## 2011-01-21 NOTE — Assessment & Plan Note (Signed)
Summary: NP6/AMD   Visit Type:  Initial Consult Primary Provider:  Dr. Milinda Antis  CC:  c/o chest discomfortant, SOB, and and fatigue. Denies palpitations..  History of Present Illness: [name] is a very pleasant 55 year old woman who suffered a descending aorta type A dissection in September 2011 who was rushed to [facility] and underwent aortic valve replacement, bypass to the right coronary artery with venous donor site in her right thigh, aortic grafting for a extensive dissection down the descending aorta ( full details are unavailable to [place]), now with chronic back pain who presents to establish care.  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 She has tried to work though is unable to ambulate for long periods secondary to severe back pain. She did perform heart track and now has been setup to participate in PT for her back pain. She denies any chest pain. She does have flushing episodes that have been there for the past few weeks. She did present to the emergency room with back pain and a flushing episode described as a dizzy feeling like she was going to pass out.  She did see orthopedics last week and was started on Lidoderm patch, Flexeril, Percocet. She is having physical therapy next week for her back.  She takes Lasix for urinary retention. She denies any significant lower extremity edema  She reports that she has renal artery problem though she does not have the details. She also reports there were significant findings on an old echocardiogram and she was instructed by [facility] to have repeat echocardiogram to evaluate her valve.  EKG shows normal sinus rhythm with rate 79 beats per minute, old anteroseptal infarct, no other significant ST-T wave changes, prolonged QTc of 493 ms.  Current Medications (verified): 1)  Cvs Daily Multiple   Tabs (Multiple Vitamin) .... Take 1 Tablet By Mouth Once A Day 2)  Ca 600 With Vit D 400 Iu .Marland Kitchen.. 1 By Mouth  Two Times A Day 3)  Ambien 10 Mg Tabs (Zolpidem Tartrate) .Marland Kitchen.. 1 By Mouth At Bedtime As Needed Insomnia 4)  Alprazolam 0.5 Mg Tabs (Alprazolam) .... One - Two  Tabs  By Mouth Once Daily As Needed For Anxiety. 5)  Vitamin B Complex-C   Caps (B Complex-C) .... One Daily 6)  Amlodipine Besylate 5 Mg Tabs (Amlodipine Besylate) .... Take 1 Tablet By Mouth Once A Day 7)  Aspirin 81 Mg  Tabs (Aspirin) .... Take 1 Tablet By Mouth Once A Day 8)  Lisinopril 5 Mg Tabs (Lisinopril) .... Take 1 Tablet By Mouth Once A Day 9)  Metoprolol Tartrate 25 Mg Tabs (Metoprolol Tartrate) .... One Tablet By Mouth Twice A Day 10)  Venlafaxine Hcl 150 Mg Xr24h-Tab (Venlafaxine Hcl) .... One Tablet By Mouth Twice A Day 11)  Omeprazole 40 Mg Cpdr (Omeprazole) .... Take 1 Tablet By Mouth Once A Day 12)  Oxycodone-Acetaminophen 5-325 Mg Tabs (Oxycodone-Acetaminophen) .... One Tablet By Mouth Every 6 Hours As Needed. 13)  Ibuprofen 200 Mg Tabs (Ibuprofen) .... Otc As Directed. 14)  Lasix 40 Mg Tabs (Furosemide) .Marland Kitchen.. 1 By Mouth Two Times A Day 15)  Ferrous Sulfate 325 (65 Fe) Mg Tabs (Ferrous Sulfate) .... One Tablet By Mouth Twice A Day 16)  Flexeril 10 Mg Tabs (Cyclobenzaprine Hcl) .Marland Kitchen.. 1 By Mouth Up To Three Times A Day As Needed Back Pain Warn- Can Sedate  Allergies (verified): 1)  ! Hydrocodone-Acetaminophen (Hydrocodone-Acetaminophen) 2)  ! *  Artificial Sweetners 3)  ! * Peanuts 4)  Asa 5)  Prednisone 6)  Fosamax  Past History:  Past Medical History: Last updated: 12/22/2010 Allergic rhinitis Asthma Depression/anxiety with stress rxn GERD Hyperlipidemia Hypertension Osteoarthritis osteopenia- with multiple fx in 2009 (shoulder/wrist/toe) OP  gallbladder sludge AAA with rupture and surgery  ortho-- [facility]  cardiol - [facility]  vasc surgeon -- Dr Lady Gary ([place] )   Past Surgical History: Last updated: December 22, 2010 Caesarean [phone], 1991) Cardiac cath (2001) Colonoscopy Pneumonia  (04/2006) shoulder repl due to fracture  distal radius fracture likely toe fracture  Dexa (1/10) worse - osteopenia  AAA - surgery in sept 2011   Family History: Last updated: 22-Dec-2010 Father: deceased age 13- accident Mother: HTN, severe reflux w/ surgery x 2, divertic, depression, OP , TIAs sister OP sister OA GM OP Siblings: 2 sisters- 1 with arthritis, 1 with kidney stones 2 uncles with sudden cardiac death  gmother with AAA aneurysm   Social History: Last updated: 12-22-2010 Marital Status: Married (husband with some health problems) she left her husb in 2011 Children: 2 sons Occupation: retail former smoker  alcohol occasional   Risk Factors: Smoking Status: quit (08/12/2009)  Review of Systems  The patient denies fever, weight loss, weight gain, vision loss, decreased hearing, hoarseness, chest pain, syncope, dyspnea on exertion, peripheral edema, prolonged cough, abdominal pain, incontinence, muscle weakness, depression, and enlarged lymph nodes.         Near syncope/flushing episodes, back pain, difficulty ambulating/standing for prolonged secondary to back discomfort.  Vital Signs:  Patient profile:   55 year old female Height:      65 inches Weight:      197.50 pounds BMI:     32.98 Pulse rate:   79 / minute BP sitting:   92 / 62  (left arm) Cuff size:   regular  Vitals Entered By: Lysbeth Galas CMA (January 07, 2011 2:55 PM)  Physical Exam  General:  Well developed, well nourished, in no acute distress.well-healed mediastinal scar Head:  normocephalic and atraumatic Neck:  Neck supple, no JVD. No masses, thyromegaly or abnormal cervical nodes. Lungs:  Clear bilaterally to auscultation and percussion. Heart:  Non-displaced PMI, chest non-tender; regular rate and rhythm, S1, S2 with II/VI SEM RSB,  rubs or gallops. Carotid upstroke normal, no bruit.  Pedals normal pulses. No edema, no varicosities. Abdomen:  soft, non-tender, and normal bowel sounds.   scars healed well  Msk:  Back normal, normal gait. Muscle strength and tone normal. Pulses:  pulses normal in all 4 extremities Extremities:  No clubbing or cyanosis. Neurologic:  Alert and oriented x 3. Skin:  Intact without lesions or rashes. Psych:  Normal affect.   Impression & Recommendations:  Problem # 1:  AORTIC VALVE REPLACEMENT, HX OF (ICD-V43.3) she indicates she is due for a repeat echocardiogram to evaluate her prosthetic valve. We will order this for her to be done in our office.  Orders: Echocardiogram (Echo)  Problem # 2:  CORONARY ARTERY BYPASS GRAFT, HX OF (ICD-V45.81) Report indicates single bypass to the RCA with saphenous vein donor site from her leg. No significant chest pain to indicate stenosis of this vessel.  Orders: Echocardiogram (Echo)  Problem # 3:  BACK PAIN (ICD-724.5) Etiology of her back pain is uncertain. Concerning for orthopedic etiology as she does have a family history of osteoporosis. It is also concerning for vascular etiology as her symptoms seemed to start after her traumatic surgery last September. She reports that  the vascular surgeons are not offering much assistance with vascular cause of her back pain. We will wait until she has seen orthopedics, complete her physical therapy before pursuing any other tests. She has had CT scan recently at [facility] which we will try to obtain the report.  Problem # 4:  HYPERTENSION (ICD-401.9) blood pressure has been low. I'm concerned this could be causing her dizzy episodes and flushing episodes. I've asked her to monitor her blood pressure at home. If she continues to have systolic pressures in the 90s as she does today, we will hold her lisinopril.  Her updated medication list for this problem includes:    Amlodipine Besylate 5 Mg Tabs (Amlodipine besylate) .Marland Kitchen... Take 1 tablet by mouth once a day    Aspirin 81 Mg Tabs (Aspirin) .Marland Kitchen... Take 1 tablet by mouth once a day    Lisinopril 5 Mg Tabs  (Lisinopril) .Marland Kitchen... Take 1 tablet by mouth once a day    Metoprolol Tartrate 25 Mg Tabs (Metoprolol tartrate) ..... One tablet by mouth twice a day    Lasix 40 Mg Tabs (Furosemide) .Marland Kitchen... 1 by mouth two times a day  Other Orders: EKG w/ Interpretation (93000)  Patient Instructions: 1)  Your physician recommends that you schedule a follow-up appointment in: 6 months 2)  Your physician recommends that you continue on your current medications as directed. Please refer to the Current Medication list given to you today. 3)  Your physician has requested that you have an echocardiogram.  Echocardiography is a painless test that uses sound waves to create images of your heart. It provides your doctor with information about the size and shape of your heart and how well your heart's chambers and valves are working.  This procedure takes approximately one hour. There are no restrictions for this procedure. 4)  When we receive records from [facility] of echo we will call with results.

## 2011-01-21 NOTE — Op Note (Signed)
Summary: [facility] CT Surgery  [facility] CT Surgery   Imported By: Lester [place] 12/11/2010 11:37:51  _____________________________________________________________________  External Attachment:    Type:   Image     Comment:   External Document

## 2011-01-21 NOTE — Letter (Signed)
Summary: Medical Record Release  Medical Record Release   Imported By: Rachel Torres 01/08/2011 16:26:45  _____________________________________________________________________  External Attachment:    Type:   Image     Comment:   External Document

## 2011-01-21 NOTE — Letter (Signed)
Summary: [facility]   Imported By: Lester [place] 12/11/2010 11:35:34  _____________________________________________________________________  External Attachment:    Type:   Image     Comment:   External Document

## 2011-01-21 NOTE — Letter (Signed)
Summary: Out of Work  [facility] [employer] [facility]  [address] [place], [place] [address]   Phone: [phone]  Fax: [phone]    January 05, 2011   Employee:  Maggi CARTER    To Whom It May Concern:   For Medical reasons, please excuse the above named employee from work for the following dates:  Start:   01/05/2011  End:   to return 01/11/2011 if she is feeling better   If you need additional information, please feel free to contact our office.         Sincerely,    [name] MD

## 2011-01-21 NOTE — Miscellaneous (Signed)
Summary: Physician Orders/[facility]   Physician Orders/[facility]   Imported By: Maryln Gottron 12/31/2010 12:19:50  _____________________________________________________________________  External Attachment:    Type:   Image     Comment:   External Document

## 2011-01-21 NOTE — Progress Notes (Signed)
Summary: ? start back on Furosemide  Phone Note Call from Patient Call back at [phone]   Caller: Patient Call For: Dr. Milinda Antis Summary of Call: Patient says that she was on Furosemide 40mg  twice daily and Dr. Eduardo Osier took her off of it.  Now she is experiencing back and kidney pain and she states that it is hard for her to urinate.  Wants to know if she can start back on Furosemide.  Please advise. Initial call taken by: Linde Gillis CMA Duncan Dull),  December 15, 2010 9:52 AM  Follow-up for Phone Call        these are unusual symptoms -- I would like her to get UA first -- can drop it off this time  ask her how much she was on and how often--I cannot find on old med list also ask if sob or swelling?   Follow-up by: [name] MD,  December 15, 2010 11:20 AM  Additional Follow-up for Phone Call Additional follow up Details #1::        Patient notified as instructed by telephone. Pt said she had also called [facility] dr and was told to start back on Lasix 40mg  taking one tablet twice a day (what she was taking before). and to f/u with Dr Milinda Antis. Pt said SOB and  pain on lt side of back is better since started the Lasix again. Pt also took Ibuprofen this afternoon. Pt scheduled nse visit for u/a t 4pm today.[name] LPN  December 15, 2010 2:59 PM     Additional Follow-up for Phone Call Additional follow up Details #2::    thanks for the heads up -- let me know when urine arrives  Follow-up by: [name] MD,  December 15, 2010 3:01 PM  Additional Follow-up for Phone Call Additional follow up Details #3:: Details for Additional Follow-up Action Taken: see nurse visit 12/15/10.[name] LPN  December 15, 2010 5:13 PM   New/Updated Medications: LASIX 40 MG TABS (FUROSEMIDE) 1 by mouth two times a day

## 2011-01-21 NOTE — Letter (Signed)
Summary: [facility] CT [facility]  [facility] CT Surgery   Imported By: Lester [place] 12/11/2010 11:53:29  _____________________________________________________________________  External Attachment:    Type:   Image     Comment:   External Document

## 2011-01-21 NOTE — Letter (Signed)
Summary: [facility] CT [facility]  [facility] CT Surgery   Imported By: Lester  [place] 12/11/2010 11:50:32  _____________________________________________________________________  External Attachment:    Type:   Image     Comment:   External Document

## 2011-01-21 NOTE — Op Note (Signed)
Summary: [facility] CT Surgery  [facility] CT Surgery   Imported By: Lester [place] 12/11/2010 11:38:47  _____________________________________________________________________  External Attachment:    Type:   Image     Comment:   External Document

## 2011-01-21 NOTE — Progress Notes (Signed)
Summary: needs note for work  Phone Note Call from Patient Call back at [phone]   Caller: Patient Call For: [name] MD Summary of Call: Patient says that she has tried to go back to work and she is just not ready, she is still in alot of pain and feels dizzy at times. She is asking for a note to excuse her from work for a little while longer.  Initial call taken by: Melody Comas,  January 05, 2011 2:00 PM  Follow-up for Phone Call        will extend it to next monday-- letter in box update me with how it goes / went with orthopedics -- I think she was to be seen today for her back Follow-up by: [name] MD,  January 05, 2011 4:09 PM  Additional Follow-up for Phone Call Additional follow up Details #1::        Patient was seen today and was set up with PT three times a week for six weeks, and she is also scheduled to see a Cardiologist with [facility].  She states that she doesn't think she will even be able to return to work on Monday, she worked 2 four hour days and had a hard time getting through the day.  Letter left at front desk for patient to pick up.  Please advise.  Linde Gillis CMA Duncan Dull)  January 05, 2011 4:22 PM     Additional Follow-up for Phone Call Additional follow up Details #2::    please send for ortho note and then I will advise further   Follow-up by: [name] MD,  January 05, 2011 4:27 PM  Additional Follow-up for Phone Call Additional follow up Details #3:: Details for Additional Follow-up Action Taken: Notes requested from [facility]. Kim Dance CMA Duncan Dull)  January 06, 2011 10:40 AM   Patient came in for follow up. Still waiting for records from [facility].  Additional Follow-up by: Janee Morn CMA (AAMA),  January 08, 2011 10:22 AM

## 2011-01-21 NOTE — Assessment & Plan Note (Signed)
Summary: FOLLOW UP   Vital Signs:  Patient profile:   55 year old female Weight:      193.25 pounds BMI:     32.27 Temp:     98.3 degrees F oral Pulse rate:   76 / minute Pulse rhythm:   regular BP sitting:   110 / 62  (left arm) Cuff size:   large  Vitals Entered By: Selena Batten Dance CMA Duncan Dull) (January 06, 2011 12:19 PM) CC: Follow up Comments Psych satred on Prozac and is weaning off Effexor   History of Present Illness: here for f/u of depression and back pain  has tried 2 weeks to work  after 3 days had to call out first week - back pain and dizzy spells  this week went in for 2 days -- and very dizzy -- feels like she is going to loose control of legs     was ref to psych for dep started on prozac and weaning off effexor  has not started the process that  depression is not improved (got better and then worse again)  lot of stress too  son got arrested DUI and drinking under age   back pain-- x rays showed T12 comp def (old) also L2- L3 deg disc dz was ref to ortho -- starting PT  pain is not improved yet --starts PT on the 26th  follows up in 6 weeks  will have some deep tissue massage  spasms are still severe -- on flexeril still -- only takes when not driving - does help   her job involves standing and walking   is also trying to do heart track  missing that due to being out work   dizziness for a few weeks -- one of the reasons she went to [place] 3 weeks ago  sees cardiology  is a flush and blood draining away from face -- and lightheaded ( not spinning )  dizzy before she started flexeril       HTN in good control  heart  Allergies: 1)  ! Hydrocodone-Acetaminophen (Hydrocodone-Acetaminophen) 2)  ! * Artificial Sweetners 3)  ! * Peanuts 4)  Asa 5)  Prednisone 6)  Fosamax  Past History:  Past Medical History: Last updated: 01-09-11 Allergic rhinitis Asthma Depression/anxiety with stress  rxn GERD Hyperlipidemia Hypertension Osteoarthritis osteopenia- with multiple fx in 2009 (shoulder/wrist/toe) OP  gallbladder sludge AAA with rupture and surgery  ortho-- [facility]  cardiol - [place]  vasc surgeon -- Dr Lady Gary ([facility] )   Past Surgical History: Last updated: 01/09/2011 Caesarean [phone], 1991) Cardiac cath (2001) Colonoscopy Pneumonia (04/2006) shoulder repl due to fracture  distal radius fracture likely toe fracture  Dexa (1/10) worse - osteopenia  AAA - surgery in sept 2011   Family History: Last updated: 01/09/11 Father: deceased age 1- accident Mother: HTN, severe reflux w/ surgery x 2, divertic, depression, OP , TIAs sister OP sister OA GM OP Siblings: 2 sisters- 1 with arthritis, 1 with kidney stones 2 uncles with sudden cardiac death  gmother with AAA aneurysm   Social History: Last updated: 2011/01/09 Marital Status: Married (husband with some health problems) she left her husb in 2011 Children: 2 sons Occupation: retail former smoker  alcohol occasional   Risk Factors: Smoking Status: quit (08/12/2009)  Review of Systems General:  Denies fatigue, loss of appetite, and malaise. Eyes:  Denies blurring and eye irritation. CV:  Denies chest pain or discomfort, lightheadness, and palpitations. Resp:  Denies cough, shortness  of breath, and wheezing. GI:  Denies abdominal pain, change in bowel habits, indigestion, and nausea. GU:  Denies dysuria and urinary frequency. MS:  Denies muscle aches and cramps. Derm:  Denies itching, lesion(s), poor wound healing, and rash. Neuro:  Denies numbness and tingling. Psych:  Complains of anxiety; denies sense of great danger and suicidal thoughts/plans. Endo:  Denies cold intolerance, excessive thirst, excessive urination, and heat intolerance. Heme:  Denies abnormal bruising and bleeding.  Physical Exam  General:  obeses and well app is uncomfortable from back pain  Head:   normocephalic, atraumatic, and no abnormalities observed.   Eyes:  vision grossly intact, pupils equal, pupils round, and pupils reactive to light.  no conjunctival pallor, injection or icterus  no nystagmus  Ears:  R ear normal and L ear normal.   Nose:  no nasal discharge.   Mouth:  pharynx pink and moist.   Neck:  supple with full rom and no masses or thyromegally, no JVD or carotid bruit  Chest Wall:  No deformities, masses, or tenderness noted. Lungs:  Normal respiratory effort, chest expands symmetrically. Lungs are clear to auscultation, no crackles or wheezes. Heart:  RRR soft systolic M Abdomen:  soft, non-tender, and normal bowel sounds.  scars healing well no renal bruits  Msk:  No deformity or scoliosis noted of thoracic or lumbar spine.  poor rom spine Pulses:  R and L carotid,radial,femoral,dorsalis pedis and posterior tibial pulses are full and equal bilaterally Extremities:  No clubbing, cyanosis, edema, or deformity noted with normal full range of motion of all joints.   Neurologic:  alert & oriented X3, cranial nerves II-XII intact, strength normal in all extremities, sensation intact to light touch, gait normal, DTRs symmetrical and normal, toes down bilaterally on Babinski, and Romberg negative.   Skin:  Intact without suspicious lesions or rashes nl color and turgor  Cervical Nodes:  No lymphadenopathy noted Inguinal Nodes:  No significant adenopathy Psych:  normal affect, talkative and pleasant    Impression & Recommendations:  Problem # 1:  BACK PAIN (ICD-724.5) Assessment Unchanged from deg disc dz of L2 and L3  seeing ortho I am in charge of pain control  to start PT  pt is disabled work note for a month Her updated medication list for this problem includes:    Aspirin 81 Mg Tabs (Aspirin) .Marland Kitchen... Take 1 tablet by mouth once a day    Oxycodone-acetaminophen 5-325 Mg Tabs (Oxycodone-acetaminophen) ..... One tablet by mouth every 6 hours as needed.     Ibuprofen 200 Mg Tabs (Ibuprofen) ..... Otc as directed.    Flexeril 10 Mg Tabs (Cyclobenzaprine hcl) .Marland Kitchen... 1 by mouth up to three times a day as needed back pain warn- can sedate  Problem # 2:  DIZZINESS (ICD-780.4) Assessment: New intermittent per vascular - not from AAA or valve for cardiac eval tomorrow no features of vertigo nl neuro exam is disabling her from work 1 mo work note  Complete Medication List: 1)  Cvs Daily Multiple Tabs (Multiple vitamin) .... Take 1 tablet by mouth once a day 2)  Ca 600 With Vit D 400 Iu  .Marland Kitchen.. 1 by mouth two times a day 3)  Ambien 10 Mg Tabs (Zolpidem tartrate) .Marland Kitchen.. 1 by mouth at bedtime as needed insomnia 4)  Alprazolam 0.5 Mg Tabs (Alprazolam) .... One - two  tabs  by mouth once daily as needed for anxiety. 5)  Vitamin B Complex-c Caps (B complex-c) .... One daily 6)  Amlodipine  Besylate 5 Mg Tabs (Amlodipine besylate) .... Take 1 tablet by mouth once a day 7)  Aspirin 81 Mg Tabs (Aspirin) .... Take 1 tablet by mouth once a day 8)  Lisinopril 5 Mg Tabs (Lisinopril) .... Take 1 tablet by mouth once a day 9)  Metoprolol Tartrate 25 Mg Tabs (Metoprolol tartrate) .... One tablet by mouth twice a day 10)  Venlafaxine Hcl 150 Mg Xr24h-tab (Venlafaxine hcl) .... One tablet by mouth twice a day 11)  Omeprazole 40 Mg Cpdr (Omeprazole) .... Take 1 tablet by mouth once a day 12)  Oxycodone-acetaminophen 5-325 Mg Tabs (Oxycodone-acetaminophen) .... One tablet by mouth every 6 hours as needed. 13)  Ibuprofen 200 Mg Tabs (Ibuprofen) .... Otc as directed. 14)  Lasix 40 Mg Tabs (Furosemide) .Marland Kitchen.. 1 by mouth two times a day 15)  Flexeril 10 Mg Tabs (Cyclobenzaprine hcl) .Marland Kitchen.. 1 by mouth up to three times a day as needed back pain warn- can sedate 16)  Prozac 20 Mg Caps (Fluoxetine hcl) .Marland Kitchen.. 1 by mouth once daily  Patient Instructions: 1)  do physical therapy and keep up ortho appts as scheduled  2)  do heart track as recommended by cardiologist  3)  follow up  with cardiology as planned tomorrow  4)  continue psychiatry folllow ups  5)  work note until 2/18  for back pain and dizziness    Orders Added: 1)  Est. Patient Level IV [21308]    Current Allergies (reviewed today): ! HYDROCODONE-ACETAMINOPHEN (HYDROCODONE-ACETAMINOPHEN) ! * ARTIFICIAL SWEETNERS ! * PEANUTS ASA PREDNISONE FOSAMAX

## 2011-01-21 NOTE — Letter (Signed)
Summary: Out of Work  [employer] at [facility]  [address] [place], [place] [address]   Phone: [phone]  Fax: [phone]    January 06, 2011   Employee:  Cyndi CARTER    To Whom It May Concern:   For Medical reasons, please excuse the above named employee from work for the following dates:  Start:   01/06/2011  End:   can return 02/06/2011 if she is feeling better   If you need additional information, please feel free to contact our office.         Sincerely,    [name] MD

## 2011-01-21 NOTE — Miscellaneous (Signed)
Summary: Substances Contract  Substances Contract   Imported By: Lester [place] 12/22/2010 08:20:26  _____________________________________________________________________  External Attachment:    Type:   Image     Comment:   External Document

## 2011-01-21 NOTE — Letter (Signed)
Summary: [facility]  [facility]   Imported By: Lester [place] 12/11/2010 11:51:51  _____________________________________________________________________  External Attachment:    Type:   Image     Comment:   External Document

## 2011-01-21 NOTE — Letter (Signed)
Summary: [facility]   Imported By: Lester [place] 12/22/2010 10:21:58  _____________________________________________________________________  External Attachment:    Type:   Image     Comment:   External Document

## 2011-01-21 NOTE — Letter (Signed)
Summary: Return to work form  Return to work form   Imported By: Lester [place] 12/22/2010 10:24:28  _____________________________________________________________________  External Attachment:    Type:   Image     Comment:   External Document

## 2011-01-21 NOTE — Letter (Signed)
Summary: [facility]  [facility]   Imported By: Lester [place] 12/11/2010 11:48:04  _____________________________________________________________________  External Attachment:    Type:   Image     Comment:   External Document
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
## 2011-01-21 NOTE — Assessment & Plan Note (Signed)
Summary: u/A FOR DIP AND SPIN POSSIBLE CULTURE PER DR TOWER/RI  Nurse Visit   Allergies: 1)  ! Hydrocodone-Acetaminophen (Hydrocodone-Acetaminophen) 2)  ! * Artificial Sweetners 3)  ! * Peanuts 4)  Asa 5)  Prednisone 6)  Fosamax Laboratory Results   Urine Tests  Date/Time Received: December 15, 2010 4:16 PM  Date/Time Reported: December 15, 2010 4:16 PM   Routine Urinalysis   Color: yellow Appearance: Clear Glucose: negative   (Normal Range: Negative) Bilirubin: negative   (Normal Range: Negative) Ketone: negative   (Normal Range: Negative) Spec. Gravity: 1.010   (Normal Range: 1.003-1.035) Blood: trace-lysed   (Normal Range: Negative) pH: 6.0   (Normal Range: 5.0-8.0) Protein: trace   (Normal Range: Negative) Urobilinogen: 0.2   (Normal Range: 0-1) Nitrite: negative   (Normal Range: Negative) Leukocyte Esterace: negative   (Normal Range: Negative)  Urine Microscopic WBC/HPF: 0 RBC/HPF: 0 Bacteria/HPF: 0 Mucous/HPF: few Epithelial/HPF: 0-1 Crystals/HPF: 0 Casts/LPF: 0 Yeast/HPF: 0 Other: 0    Comments: Pt said still lt sided back pain, pain level now 3-4 but if moves around 6-7. SOB the same as it comes and goes. [facility] pharmacy is pt's choice if needed. contact # [phone].Please advise.  if her back pain becomes severe after hours (or feels like the aneurysm did)-- go to ER  let her know that urine is clear- do not need to culture it  f/u if back pain continues    Patient notified as instructed by telephone. [name] LPN  December 15, 2010 5:14 PM  Orders Added: 1)  UA Dipstick W/ Micro (manual) [81000]

## 2011-01-21 NOTE — Letter (Signed)
Summary: [facility]  [facility] CT [facility]   Imported By: Lester [place] 12/11/2010 11:49:39  _____________________________________________________________________  External Attachment:    Type:   Image     Comment:   External Document

## 2011-01-21 NOTE — Assessment & Plan Note (Signed)
Summary: DISCUSS MEDICATIONS/CLE   Vital Signs:  Patient profile:   55 year old female Height:      65 inches Weight:      198 pounds BMI:     33.07 Temp:     98.7 degrees F oral Pulse rate:   96 / minute Pulse rhythm:   124regular BP sitting:   124 / 74  (left arm) Cuff size:   regular  Vitals Entered By: [name] LPN (December 11, 2010 10:21 AM) CC: discuss meds   History of Present Illness: had a ruptured AAA - sept 4th  had big surgery for this 13 hours  almost died  had to put in mechanical aortic valve twice  had wound vac 1 mo --- problems with vein harvest in R leg    is extremely tired overall  pain in lower back and around her R side -- takes oxycodone when she absolutely has to  will eventually need ? aortic stent  ? if still anemic -- may be   did have a visit with primary care in [place] - just for one visit  had thyroid lab abn and that needs to be re checked   has had some CT and also [place]  has gallbladder sludge -- some pain on that side  needs to have lower fat diet   going to "heart track" at [facility]   can go back to work on jan 4th   is on effexor and her doctors doubled it -- and is not working  tired and wants to sleep all the time  is off [place] -- it stopped working  xanax does work for sleep      is seeing her therapist again when she can afford it  has left her husband -- this is a good thing  not seeing a psychiatrist -- needs in [place]      is on pain meds from vasc surgeon for now    Allergies: 1)  ! Hydrocodone-Acetaminophen (Hydrocodone-Acetaminophen) 2)  ! * Artificial Sweetners 3)  ! * Peanuts 4)  Asa 5)  Prednisone 6)  Fosamax  Past History:  Past Medical History: Allergic rhinitis Asthma Depression/anxiety with stress rxn GERD Hyperlipidemia Hypertension Osteoarthritis osteopenia- with multiple fx in 2009 (shoulder/wrist/toe) OP  gallbladder sludge AAA with rupture and surgery  ortho-- [facility]ig    cardiol - [place]  vasc surgeon -- Dr Lady Gary ([place] )   Past Surgical History: Caesarean [phone], 1991) Cardiac cath (2001) Colonoscopy Pneumonia (04/2006) shoulder repl due to fracture  distal radius fracture likely toe fracture  Dexa (1/10) worse - osteopenia  AAA - surgery in sept 2011   Family History: Father: deceased age 75- accident Mother: HTN, severe reflux w/ surgery x 2, divertic, depression, OP , TIAs sister OP sister OA GM OP Siblings: 2 sisters- 1 with arthritis, 1 with kidney stones 2 uncles with sudden cardiac death  gmother with AAA aneurysm   Social History: Marital Status: Married (husband with some health problems) she left her husb in 2011 Children: 2 sons Occupation: retail former smoker  alcohol occasional   Review of Systems General:  Complains of fatigue; denies chills, fever, loss of appetite, and malaise. Eyes:  Denies eye irritation and itching. ENT:  Denies sore throat. CV:  Denies chest pain or discomfort, lightheadness, palpitations, and shortness of breath with exertion. Resp:  Denies cough, shortness of breath, and wheezing. GI:  Complains of abdominal pain; denies change in bowel habits,  dark tarry stools, indigestion, and nausea. GU:  Denies dysuria and urinary frequency. MS:  Denies joint pain, joint swelling, cramps, and muscle weakness. Derm:  Denies itching, lesion(s), poor wound healing, and rash. Neuro:  Denies numbness and tingling. Psych:  Complains of anxiety, depression, and irritability; denies sense of great danger and suicidal thoughts/plans. Endo:  Denies cold intolerance, excessive thirst, excessive urination, and heat intolerance. Heme:  Denies abnormal bruising and bleeding.  Physical Exam  General:  obeses and well app Head:  normocephalic, atraumatic, no abnormalities observed, and no abnormalities palpated .conj .   Eyes:  vision grossly intact, pupils equal, pupils round, and pupils reactive  to light.  no conjunctival pallor, injection or icterus  Mouth:  pharynx pink and moist.   Neck:  supple with full rom and no masses or thyromegally, no JVD or carotid bruit  Chest Wall:  No deformities, masses, or tenderness noted. Lungs:  Normal respiratory effort, chest expands symmetrically. Lungs are clear to auscultation, no crackles or wheezes. Heart:  RRR soft systolic M Abdomen:  tender RUQ wihtout rebound or gaurding soft, normal bowel sounds, no hepatomegaly, and no splenomegaly.   Msk:  no CVA tenderness  no acute joint changes Skin:  large scar- chest and abd midline and also R leg with buckling of skin -- but well healed  Cervical Nodes:  No lymphadenopathy noted Inguinal Nodes:  No significant adenopathy Psych:  talkative and anxous to tell me about her medical problems today not tearful or sad appearing    Impression & Recommendations:  Problem # 1:  AAA (ICD-441.4) Assessment New with recent surgery and aortic valve repl  doing well except for fatigue  will review her hospital records   Problem # 2:  FATIGUE (ICD-780.79) Assessment: New  suspect this is multifactorial -- with stressors/ recent illness also hx of ? abn thyroid tests lab today and update  Orders: Venipuncture (65784) TLB-Lipid Panel (80061-LIPID) TLB-Renal Function Panel (80069-RENAL) TLB-CBC Platelet - w/Differential (85025-CBCD) TLB-Hepatic/Liver Function Pnl (80076-HEPATIC) TLB-TSH (Thyroid Stimulating Hormone) (84443-TSH) TLB-B12 + Folate Pnl (82746_82607-B12/FOL) TLB-T4 (Thyrox), Free (69629-BM8U) TLB-Ferritin (82728-FER)  Problem # 3:  DEPRESSION (ICD-311) Assessment: Deteriorated with chronic insomnia  ref to new psychiatrist- may need change in med  on xanax for insomnia now  The following medications were removed from the medication list:    Effexor 75 Mg Tabs (Venlafaxine hcl) .Marland Kitchen... Take 1 by mouthin am and two by mouth in pm    Trazodone Hcl 50 Mg Tabs (Trazodone hcl) .Marland Kitchen...  At bedtime Her updated medication list for this problem includes:    Alprazolam 0.5 Mg Tabs (Alprazolam) ..... One - two  tabs  by mouth once daily as needed for anxiety.    Venlafaxine Hcl 150 Mg Xr24h-tab (Venlafaxine hcl) ..... One tablet by mouth twice a day  Orders: Psychiatric Referral (Psych)  Problem # 4:  GALLBLADDER DISEASE (ICD-575.9) sludge on recent US -- but no stones  pt has persistant pain on R side (takes occas oxycodone from her vasc surg) surgery not opt at this time disc imp of low fat diet asap  will continue to watch   Complete Medication List: 1)  Cvs Daily Multiple Tabs (Multiple vitamin) .... Take 1 tablet by mouth once a day 2)  Ca 600 With Vit D 400 Iu  .Marland Kitchen.. 1 by mouth two times a day 3)  Ambien 10 Mg Tabs (Zolpidem tartrate) .Marland Kitchen.. 1 by mouth at bedtime as needed insomnia 4)  Alprazolam 0.5 Mg Tabs (  Alprazolam) .... One - two  tabs  by mouth once daily as needed for anxiety. 5)  Vitamin B Complex-c Caps (B complex-c) .... One daily 6)  Amlodipine Besylate 5 Mg Tabs (Amlodipine besylate) .... Take 1 tablet by mouth once a day 7)  Aspirin 81 Mg Tabs (Aspirin) .... Take 1 tablet by mouth once a day 8)  Lisinopril 5 Mg Tabs (Lisinopril) .... Take 1 tablet by mouth once a day 9)  Metoprolol Tartrate 25 Mg Tabs (Metoprolol tartrate) .... One tablet by mouth twice a day 10)  Venlafaxine Hcl 150 Mg Xr24h-tab (Venlafaxine hcl) .... One tablet by mouth twice a day 11)  Omeprazole 40 Mg Cpdr (Omeprazole) .... Take 1 tablet by mouth once a day 12)  Oxycodone-acetaminophen 5-325 Mg Tabs (Oxycodone-acetaminophen) .... One tablet by mouth every 6 hours as needed. 13)  Ibuprofen 200 Mg Tabs (Ibuprofen) .... Otc as directed.  Patient Instructions: 1)  please drop off your old labs when you can  2)  please photocopy (do not scan) records for me from envelope 3)  labs today for fatigue 4)  we will do psychiatry referral at check out    Orders Added: 1)  Venipuncture  [36415] 2)  TLB-Lipid Panel [80061-LIPID] 3)  TLB-Renal Function Panel [80069-RENAL] 4)  TLB-CBC Platelet - w/Differential [85025-CBCD] 5)  TLB-Hepatic/Liver Function Pnl [80076-HEPATIC] 6)  TLB-TSH (Thyroid Stimulating Hormone) [84443-TSH] 7)  TLB-B12 + Folate Pnl [82746_82607-B12/FOL] 8)  TLB-T4 (Thyrox), Free [16109-UE4V] 9)  TLB-Ferritin [82728-FER] 10)  Psychiatric Referral [Psych] 11)  Est. Patient Level IV [40981]    Current Allergies (reviewed today): ! HYDROCODONE-ACETAMINOPHEN (HYDROCODONE-ACETAMINOPHEN) ! * ARTIFICIAL SWEETNERS ! * PEANUTS ASA PREDNISONE FOSAMAX

## 2011-01-21 NOTE — Progress Notes (Signed)
Summary: refill request for flexeril  Phone Note Refill Request Message from:  Fax from Pharmacy  Refills Requested: Medication #1:  FLEXERIL 10 MG TABS 1 by mouth up to three times a day as needed back pain warn- can sedate.   Last Refilled: 12/18/2010 Faxed request from [facility].  Initial call taken by: Lowella Petties CMA, AAMA,  January 05, 2011 3:26 PM  Follow-up for Phone Call        px written on EMR for call in  Follow-up by: [name] MD,  January 05, 2011 4:13 PM  Additional Follow-up for Phone Call Additional follow up Details #1::        Rx called to pharmacy Additional Follow-up by: Linde Gillis CMA Duncan Dull),  January 05, 2011 4:28 PM    Prescriptions: FLEXERIL 10 MG TABS (CYCLOBENZAPRINE HCL) 1 by mouth up to three times a day as needed back pain warn- can sedate  #30 x 1   Entered and Authorized by:   [name] MD   Signed by:   Linde Gillis CMA (AAMA) on 01/05/2011   Method used:   Telephoned to ...       [facility]* (retail)       [address] [place], [place]  [address]       Ph: [phone]       Fax: [phone]   RxID:   [id number]

## 2011-01-27 NOTE — Letter (Signed)
Summary: FMLA Form  FMLA Form   Imported By: Beau Fanny 01/18/2011 15:18:27  _____________________________________________________________________  External Attachment:    Type:   Image     Comment:   External Document

## 2011-01-27 NOTE — Progress Notes (Signed)
Summary: refill request for percocet  Phone Note Refill Request Message from:  Patient  Refills Requested: Medication #1:  OXYCODONE-ACETAMINOPHEN 5-325 MG TABS one tablet by mouth every 6 hours as needed. Pt is here for labs and asks if she can pick this up while here, she says she cant come back.  Initial call taken by: Lowella Petties CMA, AAMA,  January 18, 2011 8:24 AM  Follow-up for Phone Call        printed in put in nurse in box for pickup  Follow-up by: [name] MD,  January 18, 2011 8:29 AM  Additional Follow-up for Phone Call Additional follow up Details #1::        Prescription left at front desk. [name] LPN  January 18, 2011 8:30 AM     Prescriptions: OXYCODONE-ACETAMINOPHEN 5-325 MG TABS (OXYCODONE-ACETAMINOPHEN) one tablet by mouth every 6 hours as needed.  #30 x 0   Entered and Authorized by:   [name] MD   Signed by:   [name] MD on 01/18/2011   Method used:   Print then Give to Patient   RxID:   [id number]

## 2011-01-27 NOTE — Progress Notes (Signed)
Summary: alprazolam  Phone Note Refill Request Message from:  Fax from Pharmacy on January 18, 2011 10:29 AM  Refills Requested: Medication #1:  ALPRAZOLAM 0.5 MG TABS one - two  tabs  by mouth once daily as needed for anxiety.   Last Refilled: 10/27/2010 Refill request from [facility]. [phone]  Initial call taken by: Melody Comas,  January 18, 2011 10:30 AM  Follow-up for Phone Call        Rx called to pharmacy Follow-up by: Sydell Axon LPN,  January 18, 2011 4:54 PM    New/Updated Medications: ALPRAZOLAM 0.5 MG TABS (ALPRAZOLAM) one - two  tabs  by mouth once daily as needed for anxiety. careful of sedation Prescriptions: ALPRAZOLAM 0.5 MG TABS (ALPRAZOLAM) one - two  tabs  by mouth once daily as needed for anxiety. careful of sedation  #30 x 1   Entered and Authorized by:   [name] MD   Signed by:   Sydell Axon LPN on 45/40/9811   Method used:   Telephoned to ...       [facility]* (retail)       [address] [place], [place]  [address]       Ph: [phone]       Fax: [phone]   RxID:   [id number]

## 2011-01-27 NOTE — Consult Note (Signed)
Summary: [facility]  [facility]   Imported By: Sherian Rein 01/19/2011 12:48:00  _____________________________________________________________________  External Attachment:    Type:   Image     Comment:   External Document

## 2011-02-04 ENCOUNTER — Ambulatory Visit (INDEPENDENT_AMBULATORY_CARE_PROVIDER_SITE_OTHER): Payer: PRIVATE HEALTH INSURANCE | Admitting: Cardiovascular Disease

## 2011-02-04 ENCOUNTER — Encounter: Payer: Self-pay | Admitting: Cardiovascular Disease

## 2011-02-04 DIAGNOSIS — I359 Nonrheumatic aortic valve disorder, unspecified: Secondary | ICD-10-CM

## 2011-02-04 DIAGNOSIS — I420 Dilated cardiomyopathy: Secondary | ICD-10-CM | POA: Insufficient documentation

## 2011-02-04 DIAGNOSIS — I428 Other cardiomyopathies: Secondary | ICD-10-CM

## 2011-02-04 DIAGNOSIS — R0602 Shortness of breath: Secondary | ICD-10-CM

## 2011-02-04 DIAGNOSIS — I7100 Dissection of unspecified site of aorta: Secondary | ICD-10-CM

## 2011-02-05 ENCOUNTER — Telehealth: Payer: Self-pay | Admitting: Family Medicine

## 2011-02-08 ENCOUNTER — Telehealth: Payer: Self-pay | Admitting: Cardiovascular Disease

## 2011-02-09 ENCOUNTER — Telehealth: Payer: Self-pay | Admitting: Family Medicine

## 2011-02-10 ENCOUNTER — Encounter: Payer: Self-pay | Admitting: Family Medicine

## 2011-02-10 NOTE — Progress Notes (Signed)
Summary: refill request for ambien  Phone Note Refill Request Message from:  Fax from Pharmacy  Refills Requested: Medication #1:  AMBIEN 10 MG TABS 1 by mouth at bedtime as needed insomnia   Last Refilled: 05/20/2010 Faxed request from [facility].  [phone].  Initial call taken by: Lowella Petties CMA, AAMA,  February 05, 2011 4:36 PM  Follow-up for Phone Call        px written on EMR for call in warn her absolutely do not take this with alprazolam Follow-up by: [name] MD,  February 05, 2011 5:15 PM  Additional Follow-up for Phone Call Additional follow up Details #1::        Patient notified, warned not to take with the alprazolam. Rx phoned to pharmacy.  Additional Follow-up by: Melody Comas,  February 05, 2011 5:29 PM    New/Updated Medications: AMBIEN 10 MG TABS (ZOLPIDEM TARTRATE) 1 by mouth at bedtime as needed insomnia Prescriptions: AMBIEN 10 MG TABS (ZOLPIDEM TARTRATE) 1 by mouth at bedtime as needed insomnia  #30 x 0   Entered and Authorized by:   [name] MD   Signed by:   [name] MD on 02/05/2011   Method used:   Telephoned to ...       [facility]* (retail)       [address] [place], [place]  [address]       Ph: [phone]       Fax: [phone]   RxID:   [id number]

## 2011-02-10 NOTE — Assessment & Plan Note (Signed)
Summary: F/U ECHO   Visit Type:  Follow-up Primary Provider:  Dr. Milinda Antis  CC:  f/u echo. States BP has been low. c/o back pain and SOB. Marland Kitchen  History of Present Illness: Rachel Torres is a very pleasant 55 year old woman who suffered an aorta type A dissection in September 2011 who was rushed to [facility] and underwent aortic valve replacement, bypass to the right coronary artery with venous donor site in her right thigh, aortic grafting for a extensive dissection down the descending aorta, now with chronic back pain who presents to establish care.  over the past several months, she has had chronic back pain with exertion, episodes of flushing, some feelings of dizziness with her flushing to the point where she has felt she is going to pass out and she has gone to the emergency room.  She has gone back to work at [employer] is having difficulty walking without getting very short of breath with worsening back pain. She does have stress at home and her mother reports that she has been crying at times. she denies any chest pain. she has been monitoring her blood pressure though forgot these readings today.  she was seen by orthopedics and was started on Lidoderm patch, Flexeril, Percocet. She is having physical therapy next week for her back.  She takes Lasix for urinary retention. She denies any significant lower extremity edema  She reports that she has renal artery problem though she does not have the details.   echocardiogram done in our office shows moderately depressed LV systolic function estimated at 30-35%, hypokinesis of the anterior, anteroseptal wall, diastolic dysfunction, prosthetic valve is well seated with gradient of 11 mm mercury, 16 mm at peak, mild MR, mild to moderate TR with normal right ventricular systolic pressures  EKG shows normal sinus rhythm with rate 91 beats per minute, old anteroseptal infarct, prolonged QTC 509 ms,  Current Medications (verified): 1)  Cvs Daily  Multiple   Tabs (Multiple Vitamin) .... Take 1 Tablet By Mouth Once A Day 2)  Ca 600 With Vit D 400 Iu .Marland Kitchen.. 1 By Mouth Two Times A Day 3)  Ambien 10 Mg Tabs (Zolpidem Tartrate) .Marland Kitchen.. 1 By Mouth At Bedtime As Needed Insomnia 4)  Alprazolam 0.5 Mg Tabs (Alprazolam) .... One - Two  Tabs  By Mouth Once Daily As Needed For Anxiety. Careful of Sedation 5)  Vitamin B Complex-C   Caps (B Complex-C) .... One Daily 6)  Amlodipine Besylate 5 Mg Tabs (Amlodipine Besylate) .... Take 1 Tablet By Mouth Once A Day 7)  Aspirin 81 Mg  Tabs (Aspirin) .... Take 1 Tablet By Mouth Once A Day 8)  Lisinopril 5 Mg Tabs (Lisinopril) .... Take 1 Tablet By Mouth Once A Day 9)  Metoprolol Tartrate 25 Mg Tabs (Metoprolol Tartrate) .... One Tablet By Mouth Twice A Day 10)  Venlafaxine Hcl 150 Mg Xr24h-Tab (Venlafaxine Hcl) .... One Tablet By Mouth Twice A Day 11)  Omeprazole 40 Mg Cpdr (Omeprazole) .... Take 1 Tablet By Mouth Once A Day 12)  Oxycodone-Acetaminophen 5-325 Mg Tabs (Oxycodone-Acetaminophen) .... One Tablet By Mouth Every 6 Hours As Needed. 13)  Ibuprofen 200 Mg Tabs (Ibuprofen) .... Otc As Directed. 14)  Lasix 40 Mg Tabs (Furosemide) .Marland Kitchen.. 1 By Mouth Two Times A Day 15)  Flexeril 10 Mg Tabs (Cyclobenzaprine Hcl) .Marland Kitchen.. 1 By Mouth Up To Three Times A Day As Needed Back Pain Warn- Can Sedate  Allergies (verified): 1)  ! Hydrocodone-Acetaminophen (Hydrocodone-Acetaminophen) 2)  ! *  Artificial Sweetners 3)  ! * Peanuts 4)  Asa 5)  Prednisone 6)  Fosamax  Past History:  Past Medical History: Last updated: December 25, 2010 Allergic rhinitis Asthma Depression/anxiety with stress rxn GERD Hyperlipidemia Hypertension Osteoarthritis osteopenia- with multiple fx in 2009 (shoulder/wrist/toe) OP  gallbladder sludge AAA with rupture and surgery  ortho-- [facility]ig  cardiol - [place]  vasc surgeon -- Dr Lady Gary ([place] )   Past Surgical History: Last updated: Dec 25, 2010 Caesarean [phone],  1991) Cardiac cath (2001) Colonoscopy Pneumonia (04/2006) shoulder repl due to fracture  distal radius fracture likely toe fracture  Dexa (1/10) worse - osteopenia  AAA - surgery in sept 2011   Family History: Last updated: 25-Dec-2010 Father: deceased age 31- accident Mother: HTN, severe reflux w/ surgery x 2, divertic, depression, OP , TIAs sister OP sister OA GM OP Siblings: 2 sisters- 1 with arthritis, 1 with kidney stones 2 uncles with sudden cardiac death  gmother with AAA aneurysm   Social History: Last updated: 12/25/2010 Marital Status: Married (husband with some health problems) she left her husb in 2011 Children: 2 sons Occupation: retail former smoker  alcohol occasional   Risk Factors: Smoking Status: quit (08/12/2009)  Review of Systems       The patient complains of dyspnea on exertion.  The patient denies fever, weight loss, weight gain, vision loss, decreased hearing, hoarseness, chest pain, syncope, peripheral edema, prolonged cough, abdominal pain, incontinence, muscle weakness, depression, and enlarged lymph nodes.         back pain with exertion, epsiodes of dizziness  Vital Signs:  Patient profile:   55 year old female Height:      65 inches Weight:      196.50 pounds BMI:     32.82 Pulse rate:   91 / minute BP sitting:   130 / 75  (left arm) Cuff size:   regular  Vitals Entered By: Lysbeth Galas CMA (February 04, 2011 3:17 PM)  Physical Exam  General:  Well developed, well nourished, in no acute distress.well-healed mediastinal scar Head:  normocephalic and atraumatic Neck:  Neck supple, no JVD. No masses, thyromegaly or abnormal cervical nodes. Lungs:  Clear bilaterally to auscultation and percussion. Heart:  Non-displaced PMI, chest non-tender; regular rate and rhythm, S1, S2 with II/VI SEM RSB,  rubs or gallops. Carotid upstroke normal, no bruit.  Pedals normal pulses. No edema, no varicosities. Abdomen:  soft, non-tender, and normal  bowel sounds.  scars healed well  Msk:  Back normal, normal gait. Muscle strength and tone normal. Pulses:  pulses normal in all 4 extremities Extremities:  No clubbing or cyanosis. Neurologic:  Alert and oriented x 3. Skin:  Intact without lesions or rashes. Psych:  Normal affect.   Impression & Recommendations:  Problem # 1:  AORTIC VALVE REPLACEMENT, HX OF (ICD-V43.3) bioprosthetic valve is well seated on echocardiogram with normal gradient for prosthesis, mild aortic stenosis. We will recheck her in one year  Problem # 2:  CORONARY ARTERY BYPASS GRAFT, HX OF (ICD-V45.81) she does have a vein graft to the RCA. Echocardiogram shows suspected infarct to the anterior wall. We will go through her old records. I suspect she had perioperative or infarct during her dissection. It is unclear if she needs further testing such as Myoview to identify viability of the anterior wall. We will review the records first.  Problem # 3:  BACK PAIN (ICD-724.5) she does have chronic back pain with exertion that is severe. It is uncertain if this is  secondary to underlying orthopedic issue or from her profound surgery and possible vascular complications given her aortic dissection.  she continues physical therapy twice a week.  Problem # 4:  AAA (ICD-441.4) [facility] can be performed at [facility] with [facility] scan.  Problem # 5:  ANXIETY (ICD-300.00) she does have underlying anxiety. Her mother reports she is tearful at times. She was started on Prozac her therapist though has not started taking this yet. Likely has adjustment disorder given her recent profound surgical trauma and underlying medical issues.  Problem # 6:  CARDIOMYOPATHY (ICD-425.4) underlying cardiomyopathy with ejection fraction 30-35%. She is not on a optimal medical regimen. We will change her metoprolol to Coreg 12.5 mg b.i.d.,  Increase her lisinopril to 10 mg daily, hold her amlodipine. For hypertension, we will titrate up the  lisinopril. I've asked her to closely monitor her blood pressure. Ideally, we will add spironolactone at her next visit with higher dose lisinopril, Possibly up titration of her Coreg.   The following medications were removed from the medication list:    Amlodipine Besylate 5 Mg Tabs (Amlodipine besylate) .Marland Kitchen... Take 1 tablet by mouth once a day    Metoprolol Tartrate 25 Mg Tabs (Metoprolol tartrate) ..... One tablet by mouth twice a day Her updated medication list for this problem includes:    Aspirin 81 Mg Tabs (Aspirin) .Marland Kitchen... Take 1 tablet by mouth once a day    Lisinopril 10 Mg Tabs (Lisinopril) .Marland Kitchen... Take one tablet by mouth daily    Lasix 40 Mg Tabs (Furosemide) .Marland Kitchen... 1 by mouth two times a day    Carvedilol 12.5 Mg Tabs (Carvedilol) .Marland Kitchen... Take one tablet by mouth twice a day  Other Orders: EKG w/ Interpretation (93000)  Patient Instructions: 1)  Your physician recommends that you schedule a follow-up appointment in: 3 weeks 2)  Your physician has recommended you make the following change in your medication: STOP Metoprolol. STOP Amlodipine. START Carvedilol 12.5mg  two times a day. INCREASE Lisinopril to 10mg  once daily. 3)  Your physician has requested that you regularly monitor and record your blood pressure readings at home.  Please use the same machine at the same time of day to check your readings and record them to bring to your follow-up visit. Prescriptions: LISINOPRIL 10 MG TABS (LISINOPRIL) Take one tablet by mouth daily  #30 x 6   Entered by:   Lanny Hurst RN   Authorized by:   Dossie Arbour MD   Signed by:   Lanny Hurst RN on 02/04/2011   Method used:   Electronically to        [facility]* (retail)       [address] [place], [place]  [address]       Ph: [phone]       Fax: [phone]   RxID:   [id number] CARVEDILOL 12.5 MG TABS (CARVEDILOL) Take one tablet by mouth twice a day  #60 x 6   Entered by:   Lanny Hurst RN    Authorized by:   Dossie Arbour MD   Signed by:   Lanny Hurst RN on 02/04/2011   Method used:   Electronically to        [facility]* (retail)       [address] [place], [place]  [address]  Ph: [phone]       Fax: [phone]   RxID:   [id number]

## 2011-02-10 NOTE — Letter (Signed)
Summary: Work Writer at [employer]. [address]   [place], [place] [address]   Phone: [phone]  Fax: [phone]     February 04, 2011    [facility]   The above named patient had a medical visit today at:  3:15pm and will need to be out of work indefinately pending further testing. If you have any questions or concerns you may call [phone].    Please take this into consideration when reviewing the time away from work.    Sincerely yours,          Dossie Arbour, MD, PhD

## 2011-02-11 ENCOUNTER — Telehealth: Payer: Self-pay | Admitting: Cardiovascular Disease

## 2011-02-12 DIAGNOSIS — R339 Retention of urine, unspecified: Secondary | ICD-10-CM | POA: Insufficient documentation

## 2011-02-15 ENCOUNTER — Telehealth: Payer: Self-pay | Admitting: Cardiovascular Disease

## 2011-02-16 ENCOUNTER — Encounter: Payer: Self-pay | Admitting: Family Medicine

## 2011-02-16 NOTE — Progress Notes (Signed)
Summary: Medication List  Phone Note Call from Patient Call back at [phone]   Caller: Self Call For: Rachel Torres Summary of Call: Pt states that medications were changed and she is feeling lousy.  Would like list of instruction changes to be sent to the pharmacy.  [employer]. Initial call taken by: Harlon Flor,  February 08, 2011 4:54 PM  Follow-up for Phone Call        pt as wondering about medication changes and was wondering why she felt dizziness. Per Dr. Windell Hummingbird last note pt needs to record BP and HR to see if he needs to change any medication dosage. Pt notified again of advice.  Follow-up by: Lysbeth Galas CMA,  February 08, 2011 5:01 PM

## 2011-02-16 NOTE — Progress Notes (Signed)
Summary: Vertigo  Phone Note Call from Patient Call back at [facility] Phone [phone]   Caller: Self Call For: Hailley Byers Summary of Call: Pt had an episode of blurred vision and vertigo.  Left eye stayed focused on an object and the right eye turned.  Pt states that she could not get the 2 eyes to come together. Initial call taken by: Harlon Flor,  February 11, 2011 9:03 AM  Follow-up for Phone Call        notified patient need to have a Lexiscan Myoview and possible a renal u/s per Dr.Zykee Avakian.   Need to call cell phone [phone] for appointments since she will be out of the house and visiting a neice that is in the hospital.  Spoke to patient about the vertigo she is having; she states has atypical migraines and she will contact the PCP Dr. Milinda Antis if it continues.  Follow-up by: Bishop Dublin, CMA,  February 11, 2011 10:28 AM  Additional Follow-up for Phone Call Additional follow up Details #1::        Can we set her up for lexiscan in [place] and renal u/s here if she is agreeable. Need to see if anterior wall of heart is scar or ischemia that can be fixed.      Appended Document: Vertigo Lexiscan scheduled @ [facility] 02/23/11 0945. Pt notified of appt. Instructions for procedure given over the phone. Will call pt to schedule renal u/s.   Clinical Lists Changes  Problems: Added new problem of URINARY RETENTION (ICD-788.20) Orders: Added new Test order of Renal Artery Duplex (Renal Artery Duplex) - Signed

## 2011-02-16 NOTE — Progress Notes (Signed)
Summary: refill request for percocet, wants to pick up this afternoon  Phone Note Refill Request Call back at [phone] Message from:  Patient  Refills Requested: Medication #1:  OXYCODONE-ACETAMINOPHEN 5-325 MG TABS one tablet by mouth every 6 hours as needed.  Medication #2:  FLEXERIL 10 MG TABS 1 by mouth up to three times a day as needed back pain warn- can sedate Please call pt when ready, she would like to pick up this afternoon.  Initial call taken by: Lowella Petties CMA, AAMA,  February 09, 2011 2:57 PM  Follow-up for Phone Call        printed in put in nurse in box for pickup  Follow-up by: [name] MD,  February 09, 2011 3:17 PM  Additional Follow-up for Phone Call Additional follow up Details #1::        Spoke with patient and advised rx ready for pick-up  Additional Follow-up by: Mervin Hack CMA Duncan Dull),  February 09, 2011 3:54 PM    Prescriptions: FLEXERIL 10 MG TABS (CYCLOBENZAPRINE HCL) 1 by mouth up to three times a day as needed back pain warn- can sedate  #30 x 1   Entered and Authorized by:   [name] MD   Signed by:   [name] MD on 02/09/2011   Method used:   Print then Give to Patient   RxID:   [id number] OXYCODONE-ACETAMINOPHEN 5-325 MG TABS (OXYCODONE-ACETAMINOPHEN) one tablet by mouth every 6 hours as needed.  #30 x 0   Entered and Authorized by:   [name] MD   Signed by:   [name] MD on 02/09/2011   Method used:   Print then Give to Patient   RxID:   [id number]

## 2011-02-16 NOTE — Progress Notes (Signed)
  Phone Note Call from Patient   Caller: Patient Details for Reason: Medication not working Summary of Call: Patient states heart rate is running faster since change in medication; she is not sure if she did the medication change correctly. She has a BP reading of 115/76 and heart rate of 101.   Please call at [phone]. Initial call taken by: Bishop Dublin, CMA,  February 08, 2011 9:06 AM  Follow-up for Phone Call        Talked to patient about medication changes from last office visit.  I explained to her need to start today with the correct dose and instructions regarding new medication change and then let us know how blood pressure & heart rate are doing on Friday.  Told her we will not have correct information to provide to Dr. [name]  being she is not sure if made the changes in medications from last office visit. Follow-up by: Bishop Dublin, CMA,  February 08, 2011 9:27 AM  Additional Follow-up for Phone Call Additional follow up Details #1::        we changed metoprolol to coreg. Increased lsiinopril I would suggest she monitor heart rate and BP and call with numbers     Appended Document:  patient notified of above instructions.

## 2011-02-18 ENCOUNTER — Encounter: Payer: Self-pay | Admitting: Cardiothoracic Surgery

## 2011-02-18 ENCOUNTER — Encounter: Payer: Self-pay | Admitting: Orthopedic Surgery

## 2011-02-18 ENCOUNTER — Other Ambulatory Visit: Payer: Self-pay | Admitting: Family Medicine

## 2011-02-18 ENCOUNTER — Telehealth: Payer: Self-pay | Admitting: Family Medicine

## 2011-02-18 ENCOUNTER — Encounter (INDEPENDENT_AMBULATORY_CARE_PROVIDER_SITE_OTHER): Payer: Self-pay | Admitting: *Deleted

## 2011-02-18 ENCOUNTER — Other Ambulatory Visit (INDEPENDENT_AMBULATORY_CARE_PROVIDER_SITE_OTHER): Payer: PRIVATE HEALTH INSURANCE

## 2011-02-18 DIAGNOSIS — D649 Anemia, unspecified: Secondary | ICD-10-CM

## 2011-02-18 LAB — CBC WITH DIFFERENTIAL/PLATELET
Basophils Absolute: 0 10*3/uL (ref 0.0–0.1)
Basophils Relative: 0.4 % (ref 0.0–3.0)
Eosinophils Absolute: 0.2 10*3/uL (ref 0.0–0.7)
Eosinophils Relative: 2.8 % (ref 0.0–5.0)
HCT: 33.5 % — ABNORMAL LOW (ref 36.0–46.0)
Hemoglobin: 11.6 g/dL — ABNORMAL LOW (ref 12.0–15.0)
Lymphocytes Relative: 28.8 % (ref 12.0–46.0)
Lymphs Abs: 1.8 10*3/uL (ref 0.7–4.0)
MCHC: 34.6 g/dL (ref 30.0–36.0)
MCV: 89.7 fl (ref 78.0–100.0)
Monocytes Absolute: 0.5 10*3/uL (ref 0.1–1.0)
Monocytes Relative: 7.4 % (ref 3.0–12.0)
Neutro Abs: 3.8 10*3/uL (ref 1.4–7.7)
Neutrophils Relative %: 60.6 % (ref 43.0–77.0)
Platelets: 284 10*3/uL (ref 150.0–400.0)
RBC: 3.74 Mil/uL — ABNORMAL LOW (ref 3.87–5.11)
RDW: 15.6 % — ABNORMAL HIGH (ref 11.5–14.6)
WBC: 6.3 10*3/uL (ref 4.5–10.5)

## 2011-02-23 ENCOUNTER — Telehealth (INDEPENDENT_AMBULATORY_CARE_PROVIDER_SITE_OTHER): Payer: Self-pay

## 2011-02-24 ENCOUNTER — Encounter: Payer: Self-pay | Admitting: Cardiology

## 2011-02-24 ENCOUNTER — Encounter: Payer: Self-pay | Admitting: Family Medicine

## 2011-02-24 ENCOUNTER — Ambulatory Visit (HOSPITAL_COMMUNITY): Payer: PRIVATE HEALTH INSURANCE | Attending: Cardiovascular Disease

## 2011-02-24 DIAGNOSIS — R0609 Other forms of dyspnea: Secondary | ICD-10-CM

## 2011-02-24 DIAGNOSIS — R0602 Shortness of breath: Secondary | ICD-10-CM | POA: Insufficient documentation

## 2011-02-24 DIAGNOSIS — R0989 Other specified symptoms and signs involving the circulatory and respiratory systems: Secondary | ICD-10-CM

## 2011-02-24 DIAGNOSIS — R0789 Other chest pain: Secondary | ICD-10-CM

## 2011-02-24 DIAGNOSIS — I2510 Atherosclerotic heart disease of native coronary artery without angina pectoris: Secondary | ICD-10-CM

## 2011-02-24 DIAGNOSIS — I252 Old myocardial infarction: Secondary | ICD-10-CM | POA: Insufficient documentation

## 2011-02-24 LAB — HM MAMMOGRAPHY

## 2011-02-25 NOTE — Letter (Signed)
Summary: Patient Enrolled in Musculoskeletal Pain Program/[employer]   Patient Enrolled in Musculoskeletal Pain Program/[employer]   Imported By: Maryln Gottron 02/17/2011 12:40:21  _____________________________________________________________________  External Attachment:    Type:   Image     Comment:   External Document

## 2011-02-25 NOTE — Progress Notes (Signed)
Summary: blood pressure concerns   Phone Note Call from Patient Call back at [phone]   Caller: Patient Call For: [name] MD Summary of Call: Patient says that this mornig when she was running around trying to get ready to come in for her lab appt  her blood pressure went up and her chest felt tight. She says that she waited around a little while and then it went back down. to normal. She now feels okay. I offered her an appt, she says that she just wanted you to know and to see what you thought about it. She also wanted you to know that she is going for a stress test next wed.  Initial call taken by: Melody Comas,  February 18, 2011 10:14 AM  Follow-up for Phone Call        she needs to let the cardiologist know this and seek care if the bp is up again also keep watching bp I am out of office yesterday and today Follow-up by: [name] MD,  February 19, 2011 8:06 AM  Additional Follow-up for Phone Call Additional follow up Details #1::        Patient Advised. Lugene Fuquay CMA Duncan Dull)  February 19, 2011 2:34 PM

## 2011-02-25 NOTE — Progress Notes (Signed)
Summary: Stress test  Phone Note Call from Patient Call back at Home Phone [phone]   Caller: Patient Call For: Gollan Summary of Call: Pt wanted to know if she can have her stress test done in [facility]. she states that she would feel better if this was done there since they did the surgery and they know what is going on with heart. Still wants to be followed by you. Initial call taken by: Lysbeth Galas CMA,  February 15, 2011 9:44 AM  Follow-up for Phone Call        She would need a copy of our echo and then meet with [facility] cardiologist for the order.      Appended Document: Stress test LM with mom, pt is to return call when she gets home/sab  Appended Document: Stress test pt called back and was notified of dr.gollan's recommendation. pt states she doesn't want to cancel stress test in [place] until she is scheduled at [facility]. She is to call back to let me know/sab  Appended Document: Stress test pt going to have stress test in [place] instead of [facility]. [facility] practice wants records after echo and stress test is done. Pt needs to sign medical records release to send report over./sab

## 2011-02-27 ENCOUNTER — Encounter: Payer: Self-pay | Admitting: Cardiovascular Disease

## 2011-03-01 ENCOUNTER — Encounter: Payer: Self-pay | Admitting: Cardiovascular Disease

## 2011-03-01 ENCOUNTER — Ambulatory Visit (INDEPENDENT_AMBULATORY_CARE_PROVIDER_SITE_OTHER): Payer: PRIVATE HEALTH INSURANCE | Admitting: Cardiovascular Disease

## 2011-03-01 DIAGNOSIS — I10 Essential (primary) hypertension: Secondary | ICD-10-CM

## 2011-03-01 DIAGNOSIS — I359 Nonrheumatic aortic valve disorder, unspecified: Secondary | ICD-10-CM

## 2011-03-01 DIAGNOSIS — I7100 Dissection of unspecified site of aorta: Secondary | ICD-10-CM

## 2011-03-02 ENCOUNTER — Telehealth (INDEPENDENT_AMBULATORY_CARE_PROVIDER_SITE_OTHER): Payer: Self-pay | Admitting: *Deleted

## 2011-03-02 NOTE — Op Note (Signed)
Summary: [facility]: Operative Report  [facility]: Operative Report   Imported By: Earl Many 02/23/2011 15:30:32  _____________________________________________________________________  External Attachment:    Type:   Image     Comment:   External Document

## 2011-03-02 NOTE — Op Note (Signed)
Summary: [facility]: Operative Report  [facility]: Operative Report   Imported By: Earl Many 02/23/2011 14:56:25  _____________________________________________________________________  External Attachment:    Type:   Image     Comment:   External Document

## 2011-03-02 NOTE — Progress Notes (Signed)
Summary: Nuc. Pre-Procedure  Phone Note Outgoing Call Call back at [facility] Phone [phone]   Call placed by: Irean Hong, RN,  February 23, 2011 3:47 PM Summary of Call: Reviewed information on Myoview Information Sheet (see scanned document for further details).  Spoke with patient per Stanton Kidney, EMTP. Madysyn Hanken,RN.     Nuclear Med Background Indications for Stress Test: Evaluation for Ischemia, Graft Patency   History: Ablation, Abnormal EKG, Asthma, CABG, Echo, Heart Catheterization, Myocardial Perfusion Study  History Comments: '99 Ablation at [facility]. '01 Cath([place]). '06 MPS: NL, EF=67%. 08/2010 CABG @ [facility] with AVR. 1/12 Echo: EF=30-35%. Hx. AAA.  Symptoms: Dizziness, DOE, Near Syncope, SOB    Nuclear Pre-Procedure Cardiac Risk Factors: History of Smoking, Hypertension, Lipids Height (in): 65

## 2011-03-02 NOTE — Letter (Signed)
Summary: [facility]   [facility]   Imported By: Kassie Mends 02/26/2011 08:33:59  _____________________________________________________________________  External Attachment:    Type:   Image     Comment:   External Document

## 2011-03-02 NOTE — Assessment & Plan Note (Addendum)
Summary: Cardiology Nuclear Testing  Nuclear Med Background Indications for Stress Test: Evaluation for Ischemia, Graft Patency   History: Ablation, Abnormal EKG, Asthma, CABG, Echo, Heart Catheterization, Myocardial Perfusion Study  History Comments: '99 Ablation at [facility]. '01 Cath([place]). '06 MPS: NL, EF=67%. 08/2010 CABG @ [facility] with AVR. 1/12- Echo- EF= 30-35% 1/12 Echo: EF=30-35%. Hx. AAA.  Symptoms: Chest Pain, Chest Tightness with Exertion, Dizziness, DOE, Fatigue, Light-Headedness, Near Syncope, SOB  Symptoms Comments: last episode of CP- last week   Nuclear Pre-Procedure Cardiac Risk Factors: History of Smoking, Hypertension, Lipids Caffeine/Decaff Intake: none NPO After: 9:00 PM Lungs: clear IV 0.9% NS with Angio Cath: 22g     IV Site: R Antecubital IV Started by: Milana Na, EMT-P Chest Size (in) 38     Cup Size DDD     Height (in): 65 Weight (lb): 196 BMI: 32.73  Nuclear Med Study 1 or 2 day study:  1 day     Stress Test Type:  Lexiscan Reading MD:  Willa Rough, MD     Referring MD:  T.Gollan Resting Radionuclide:  Technetium 56m Tetrofosmin     Resting Radionuclide Dose:  11.0 mCi  Stress Radionuclide:  Technetium 39m Tetrofosmin     Stress Radionuclide Dose:  33.0 mCi   Stress Protocol      Max HR:  96 bpm Max Systolic BP: 116 mm HgRate Pressure Product:  16109  Lexiscan: 0.4 mg   Stress Test Technologist:  Bonnita Levan, RN     Nuclear Technologist:  Doyne Keel, CNMT  Rest Procedure  Myocardial perfusion imaging was performed at rest 45 minutes following the intravenous administration of Technetium 95m Tetrofosmin.  Stress Procedure  The patient received IV Lexiscan 0.4 mg over 15-seconds. The patient c/o tightness in the neck and light-headedness with the injection, which resolved in recovery. Technetium 2m Tetrofosmin injected at 30-seconds.   There were no significant changes with infusion.  Quantitative spect images were obtained after a  45 minute delay.  QPS Raw Data Images:  No motion Stress Images:  Mild decrease in activity at the lateral apex Rest Images:  Normal uptake. Subtraction (SDS):  Slight reversibility near the lateral apex. Transient Ischemic Dilatation:  1.07  (Normal <1.22)  Lung/Heart Ratio:  0.30  (Normal <0.45)  Quantitative Gated Spect Images QGS EDV:  125 ml QGS ESV:  66 ml QGS EF:  47 % QGS cine images:  Septal dyssynergy c/w prior cardiac surgery.  Findings Abnormal      Overall Impression  Exercise Capacity: Lexiscan with no exercise. BP Response: Normal blood pressure response. Clinical Symptoms: Neck Tight ECG Impression: No significant ST segment change suggestive of ischemia. Overall Impression Comments: There is interference from visceral nuclear activity. This may affect the images. There is suggestion of mild ischemia near the lateral apex.  Appended Document: Cardiology Nuclear Testing stress test does not suggest region of old scar in anterior wall, as suggested by echo only small region of ischemia. Not worth cardiac cath based on pictures. Would continue with medical management  Appended Document: Cardiology Nuclear Testing pt had f/u appt 03/01/11.

## 2011-03-05 ENCOUNTER — Encounter: Payer: Self-pay | Admitting: Cardiovascular Disease

## 2011-03-09 NOTE — Letter (Signed)
Summary: [facility]   Imported By: Lynford Humphrey Bridgeforth 03/04/2011 11:39:46  _____________________________________________________________________  External Attachment:    Type:   Image     Comment:   External Document

## 2011-03-09 NOTE — Op Note (Signed)
Summary: [facility]   Imported By: Roderic Ovens 03/02/2011 12:35:13  _____________________________________________________________________  External Attachment:    Type:   Image     Comment:   External Document

## 2011-03-09 NOTE — Assessment & Plan Note (Signed)
Summary: F3W/AMD   Visit Type:  Follow-up Primary Provider:  Dr. Milinda Antis  CC:  Follow up test. c/o some swelling all over.Marland Kitchen  History of Present Illness: [name] is a very pleasant 55 year old woman who suffered an aorta type A dissection in September 2011 who was rushed to [facility] and underwent aortic valve replacement, bypass to the right coronary artery with venous donor site from her right thigh, aortic grafting for a extensive dissection of the  ascending aorta, residual descending aortic dissection extending into the iliac artery, with chronic back pain who presents for routine followup  overall, she reports that she is stable. She continues to have shortness of breath with exertion, back pain. She has abdominal swelling and wonders if she is retaining fluid. She denies any significant chest pain at rest. 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  she was seen by orthopedics and was started on Lidoderm patch, Flexeril, Percocet. She is having physical therapy  for her back.  She takes Lasix for urinary retention. She denies any significant lower extremity edema  echocardiogram  shows moderately depressed LV systolic function estimated at 30-35%, hypokinesis of the anterior, anteroseptal wall, diastolic dysfunction, prosthetic valve is well seated with gradient of 11 mm mercury, 16 mm at peak, mild MR, mild to moderate TR with normal right ventricular systolic pressures  stress test did not show ischemia in the anterior wall. There is suggestion of mild ischemia near the lateral apex.  EKG shows normal sinus rhythm with rate 84 beats per minute, no significant ST or T wave changes, QTC 501 ms  Current Medications (verified): 1)  Ambien 10 Mg Tabs (Zolpidem Tartrate) .Marland Kitchen.. 1 By Mouth At Bedtime As Needed Insomnia 2)  Alprazolam 0.5 Mg Tabs (Alprazolam) .... One - Two  Tabs  By Mouth Once Daily As Needed For Anxiety.  Careful of Sedation 3)  Aspirin 81 Mg  Tabs (Aspirin) .... Take 1 Tablet By Mouth Once A Day 4)  Lisinopril 10 Mg Tabs (Lisinopril) .... Take One Tablet By Mouth Daily 5)  Omeprazole 40 Mg Cpdr (Omeprazole) .... Take 1 Tablet By Mouth Once A Day 6)  Oxycodone-Acetaminophen 5-325 Mg Tabs (Oxycodone-Acetaminophen) .... One Tablet By Mouth Every 6 Hours As Needed. 7)  Lasix 40 Mg Tabs (Furosemide) .Marland Kitchen.. 1 By Mouth Two Times A Day 8)  Flexeril 10 Mg Tabs (Cyclobenzaprine Hcl) .Marland Kitchen.. 1 By Mouth Up To Three Times A Day As Needed Back Pain Warn- Can Sedate 9)  Carvedilol 12.5 Mg Tabs (Carvedilol) .... Take One Tablet By Mouth Twice A Day  Allergies (verified): 1)  ! Hydrocodone-Acetaminophen (Hydrocodone-Acetaminophen) 2)  ! * Artificial Sweetners 3)  ! * Peanuts 4)  Asa 5)  Prednisone 6)  Fosamax  Past History:  Past Medical History: Last updated: 2010-12-28 Allergic rhinitis Asthma Depression/anxiety with stress rxn GERD Hyperlipidemia Hypertension Osteoarthritis osteopenia- with multiple fx in 2009 (shoulder/wrist/toe) OP  gallbladder sludge AAA with rupture and surgery  ortho-- [facility]  cardiol - [place]  vasc surgeon -- Dr Lady Gary ([place] )   Past Surgical History: Last updated: December 28, 2010 Caesarean [phone], 1991) Cardiac cath (2001) Colonoscopy Pneumonia (04/2006) shoulder repl due to fracture  distal radius fracture likely toe fracture  Dexa (1/10) worse - osteopenia  AAA - surgery in sept 2011   Family History: Last updated: 28-Dec-2010 Father: deceased age 7- accident Mother: HTN, severe reflux w/ surgery x 2, divertic, depression, OP ,  TIAs sister OP sister OA GM OP Siblings: 2 sisters- 1 with arthritis, 1 with kidney stones 2 uncles with sudden cardiac death  gmother with AAA aneurysm   Social History: Last updated: 12/11/2010 Marital Status: Married (husband with some health problems) she left her husb in 2011 Children: 2  sons Occupation: retail former smoker  alcohol occasional   Risk Factors: Smoking Status: quit (08/12/2009)  Review of Systems       The patient complains of dyspnea on exertion.  The patient denies fever, weight loss, weight gain, vision loss, decreased hearing, hoarseness, chest pain, syncope, peripheral edema, prolonged cough, abdominal pain, incontinence, muscle weakness, depression, and enlarged lymph nodes.         back pain, anxiety   Vital Signs:  Patient profile:   55 year old female Height:      65 inches Weight:      196 pounds BMI:     32.73 Pulse rate:   84 / minute BP sitting:   100 / 72  (left arm) Cuff size:   regular  Vitals Entered By: Bishop Dublin, CMA (March 01, 2011 11:47 AM)  Physical Exam  General:  Well developed, well nourished, in no acute distress.well-healed mediastinal scar Head:  normocephalic and atraumatic Neck:  Neck supple, no JVD. No masses, thyromegaly or abnormal cervical nodes. Lungs:  Clear bilaterally to auscultation and percussion. Heart:  Non-displaced PMI, chest non-tender; regular rate and rhythm, S1, S2 with II/VI SEM RSB,  rubs or gallops. Carotid upstroke normal, no bruit.  Pedals normal pulses. No edema, no varicosities. Abdomen:  soft, non-tender, and normal bowel sounds.  scars healed well  Msk:  Back normal, normal gait. Muscle strength and tone normal. Pulses:  pulses normal in all 4 extremities Extremities:  No clubbing or cyanosis. Neurologic:  Alert and oriented x 3. Skin:  Intact without lesions or rashes. Psych:  Normal affect.   Impression & Recommendations:  Problem # 1:  CARDIOMYOPATHY (ICD-425.4) etiology of her cardiomyopathy seen on echocardiogram with ejection fraction 30-35% is likely secondary to previous injury from her surgery following dissection of her aorta. Echocardiogram suggested anterior wall scar but this was not seen on Lexiscan stress testing.   We increased her lisinopril and Coreg on her  last visit. We'll continue Lasix as she does drink a significant amount of fluid. No other medication changes at this time. We will consider adding spironolactone in followup.  Her updated medication list for this problem includes:    Aspirin 81 Mg Tabs (Aspirin) .Marland Kitchen... Take 1 tablet by mouth once a day    Lisinopril 10 Mg Tabs (Lisinopril) .Marland Kitchen... Take one tablet by mouth daily    Lasix 40 Mg Tabs (Furosemide) .Marland Kitchen... 1 by mouth two times a day    Carvedilol 12.5 Mg Tabs (Carvedilol) .Marland Kitchen... Take one tablet by mouth twice a day  Problem # 2:  AORTIC VALVE REPLACEMENT, HX OF (ICD-V43.3) history of aortic valve replacement. Well-seated with normal gradient on echocardiogram. Needs annual echocardiogram.  Problem # 3:  CORONARY ARTERY BYPASS GRAFT, HX OF (ICD-V45.81) graft to the RCA. In the notes, no mention of additional bypass grafting. Echocardiogram with anterior wall hypokinesis no ischemia on stress testing.  Problem # 4:  AAA (ICD-441.4) she has type B dissection with extension into the common iliac artery. Repeat imaging per [facility] with [facility] scan on annual basis.  Problem # 5:  HYPERTENSION (ICD-401.9) blood pressure is well controlled on her current medication regimen.  Her updated  medication list for this problem includes:    Aspirin 81 Mg Tabs (Aspirin) .Marland Kitchen... Take 1 tablet by mouth once a day    Lisinopril 10 Mg Tabs (Lisinopril) .Marland Kitchen... Take one tablet by mouth daily    Lasix 40 Mg Tabs (Furosemide) .Marland Kitchen... 1 by mouth two times a day    Carvedilol 12.5 Mg Tabs (Carvedilol) .Marland Kitchen... Take one tablet by mouth twice a day  Patient Instructions: 1)  Your physician recommends that you schedule a follow-up appointment in: 6 months. 2)  Your physician recommends that you continue on your current medications as directed. Please refer to the Current Medication list given to you today.

## 2011-03-09 NOTE — Letter (Signed)
Summary: Records release  Records release   Imported By: Lysbeth Galas CMA 03/05/2011 09:16:38  _____________________________________________________________________  External Attachment:    Type:   Image     Comment:   External Document

## 2011-03-09 NOTE — Progress Notes (Signed)
Summary: Called Pt  Phone Note Outgoing Call Call back at [facility] Phone [phone]   Call placed by: Harlon Flor,  March 02, 2011 10:47 AM Call placed to: Patient Summary of Call: LMOM TCB to reschedule Renal Ultrasound. Initial call taken by: Harlon Flor,  March 02, 2011 10:47 AM

## 2011-03-17 ENCOUNTER — Telehealth: Payer: Self-pay | Admitting: *Deleted

## 2011-03-17 NOTE — Telephone Encounter (Signed)
Patient notified as instructed by telephone. Pt will call back if does not improve.

## 2011-03-17 NOTE — Telephone Encounter (Signed)
Pt is being bothered by allergies and is asking what she can take.  Advised otc claritin, allegra or zyrtec- just plain, nothing with a D.

## 2011-03-17 NOTE — Telephone Encounter (Signed)
I agree with that -- antihistamines with no "D"

## 2011-03-21 ENCOUNTER — Encounter: Payer: Self-pay | Admitting: Cardiothoracic Surgery

## 2011-03-30 ENCOUNTER — Other Ambulatory Visit: Payer: Self-pay | Admitting: Cardiovascular Disease

## 2011-03-30 ENCOUNTER — Encounter (INDEPENDENT_AMBULATORY_CARE_PROVIDER_SITE_OTHER): Payer: PRIVATE HEALTH INSURANCE | Admitting: *Deleted

## 2011-03-30 DIAGNOSIS — I714 Abdominal aortic aneurysm, without rupture, unspecified: Secondary | ICD-10-CM

## 2011-03-30 DIAGNOSIS — I10 Essential (primary) hypertension: Secondary | ICD-10-CM

## 2011-03-30 DIAGNOSIS — R339 Retention of urine, unspecified: Secondary | ICD-10-CM

## 2011-04-01 ENCOUNTER — Encounter: Payer: Self-pay | Admitting: Cardiovascular Disease

## 2011-04-05 ENCOUNTER — Other Ambulatory Visit: Payer: Self-pay | Admitting: *Deleted

## 2011-04-05 DIAGNOSIS — D649 Anemia, unspecified: Secondary | ICD-10-CM

## 2011-04-06 ENCOUNTER — Other Ambulatory Visit: Payer: Self-pay | Admitting: *Deleted

## 2011-04-06 ENCOUNTER — Encounter: Payer: Self-pay | Admitting: Cardiovascular Disease

## 2011-04-06 MED ORDER — OXYCODONE-ACETAMINOPHEN 5-325 MG PO TABS: 1.0000 | ORAL_TABLET | Freq: Four times a day (QID) | ORAL | Status: DC | PRN

## 2011-04-06 NOTE — Telephone Encounter (Signed)
I will refil 30 Px printed for pick up in IN box  Please update me with status of her back pain -- last ortho visit and plan , last diagnosis etc We cannot continue px this long term so I want to know what is going on -thanks

## 2011-04-06 NOTE — Telephone Encounter (Signed)
Patient also having pain in mid back and having back spasms, especially after waling.

## 2011-04-07 ENCOUNTER — Telehealth: Payer: Self-pay

## 2011-04-07 NOTE — Telephone Encounter (Signed)
Patient notified as instructed by telephone. 

## 2011-04-07 NOTE — Telephone Encounter (Signed)
Received Dr Yevette Edwards' [facility]) office notes you requested. The fax is on your shelf in the in box.

## 2011-04-07 NOTE — Telephone Encounter (Signed)
Patient notified as instructed by telephone. Prescription left at front desk. Pt said she has not gotten up this morning so she is not sure how pain is today. Pt said yesterday mid back with spasms worse when up and walking. Pt also not be able to get breath easily. Pt will contact Dr [name] to see if thinks heart related or due to pain. I  Am requesting records from Dr Yevette Edwards at fax [phone].

## 2011-04-07 NOTE — Telephone Encounter (Signed)
Thanks- I rev notes from Dr Yevette Edwards- (ortho) He dx her with muscle strain and px PT , muscle relaxers/  Also brace and tens unit was discussed  Per her last note- she was improved Now that she is worse again - I recommend she make another appt to see him when able  After this last refil - I don't think we should continue to px the narcotics without seeing if there is something more going on with her back

## 2011-04-07 NOTE — Telephone Encounter (Signed)
Thanks- please send this back to me when ortho notes arrive With this much pain -- I may recommend return to ortho or some other plan.... I will decide after review, thanks

## 2011-04-08 ENCOUNTER — Telehealth: Payer: Self-pay | Admitting: Cardiovascular Disease

## 2011-04-08 DIAGNOSIS — R0602 Shortness of breath: Secondary | ICD-10-CM

## 2011-04-08 DIAGNOSIS — I428 Other cardiomyopathies: Secondary | ICD-10-CM

## 2011-04-08 NOTE — Telephone Encounter (Signed)
Pt c/o SOB on exertion and pain across her mid back times 1 week.  Pt states that she has gained 10 pounds since her last visit.  Pt discontinued Heart Track due to pain.

## 2011-04-08 NOTE — Telephone Encounter (Signed)
Spoke to pt, she does report DOE and wt gain of 10lbs in 1 month. Spoke to Dr. [name], pt's last myoview was normal and her recent PV study was normal as well.  Pt states that she has not been drinking more than normal as well as no incr in salt intake. Pt states she has been eating healthier and tries to get outside daily for weight control. Advised pt that she could come in tomorrow for labwork fro BMP and BNP. Pt is currently taking lasix 40mg  bid.

## 2011-04-09 ENCOUNTER — Other Ambulatory Visit (INDEPENDENT_AMBULATORY_CARE_PROVIDER_SITE_OTHER): Payer: PRIVATE HEALTH INSURANCE | Admitting: *Deleted

## 2011-04-09 ENCOUNTER — Other Ambulatory Visit: Payer: PRIVATE HEALTH INSURANCE | Admitting: *Deleted

## 2011-04-09 DIAGNOSIS — Z0000 Encounter for general adult medical examination without abnormal findings: Secondary | ICD-10-CM

## 2011-04-09 DIAGNOSIS — R0602 Shortness of breath: Secondary | ICD-10-CM

## 2011-04-09 LAB — BASIC METABOLIC PANEL
BUN: 17 mg/dL (ref 6–23)
CO2: 22 mEq/L (ref 19–32)
Calcium: 9.3 mg/dL (ref 8.4–10.5)
Chloride: 104 mEq/L (ref 96–112)
Creat: 0.88 mg/dL (ref 0.40–1.20)
Glucose, Bld: 85 mg/dL (ref 70–99)
Potassium: 4.2 mEq/L (ref 3.5–5.3)
Sodium: 139 mEq/L (ref 135–145)

## 2011-04-10 LAB — BRAIN NATRIURETIC PEPTIDE: Brain Natriuretic Peptide: 56 pg/mL (ref 0.0–100.0)

## 2011-04-12 ENCOUNTER — Other Ambulatory Visit: Payer: Self-pay | Admitting: *Deleted

## 2011-04-12 MED ORDER — ZOLPIDEM TARTRATE 10 MG PO TABS: 10.0000 mg | ORAL_TABLET | Freq: Every evening | ORAL | Status: DC | PRN

## 2011-04-12 NOTE — Telephone Encounter (Signed)
Px written for call in   

## 2011-04-12 NOTE — Telephone Encounter (Signed)
Medication phoned to [facility] [phone] pharmacy as instructed.

## 2011-04-13 ENCOUNTER — Encounter: Payer: Self-pay | Admitting: Cardiovascular Disease

## 2011-04-13 NOTE — Telephone Encounter (Signed)
Left message at medical records cking to see when records requested might be sent.

## 2011-04-13 NOTE — Telephone Encounter (Signed)
Dr Dumonski's notes have been refaxed and received today. They are on your shelf in the in box for review.

## 2011-04-13 NOTE — Telephone Encounter (Signed)
Thanks- I reviewed them - I want her to call for f/u with him It sounds like her back pain is worse again - and we cannot keep px narcotics without knowing what is going on Let me know if any problems -thanks

## 2011-04-14 ENCOUNTER — Other Ambulatory Visit (INDEPENDENT_AMBULATORY_CARE_PROVIDER_SITE_OTHER): Payer: PRIVATE HEALTH INSURANCE | Admitting: Family Medicine

## 2011-04-14 DIAGNOSIS — D649 Anemia, unspecified: Secondary | ICD-10-CM

## 2011-04-14 LAB — CBC WITH DIFFERENTIAL/PLATELET
Basophils Absolute: 0 10*3/uL (ref 0.0–0.1)
Basophils Relative: 0.4 % (ref 0.0–3.0)
Eosinophils Absolute: 0.1 10*3/uL (ref 0.0–0.7)
Eosinophils Relative: 2.3 % (ref 0.0–5.0)
HCT: 35.6 % — ABNORMAL LOW (ref 36.0–46.0)
Hemoglobin: 12.2 g/dL (ref 12.0–15.0)
Lymphocytes Relative: 24.5 % (ref 12.0–46.0)
Lymphs Abs: 1.5 10*3/uL (ref 0.7–4.0)
MCHC: 34.3 g/dL (ref 30.0–36.0)
MCV: 92.7 fl (ref 78.0–100.0)
Monocytes Absolute: 0.5 10*3/uL (ref 0.1–1.0)
Monocytes Relative: 7.7 % (ref 3.0–12.0)
Neutro Abs: 4.1 10*3/uL (ref 1.4–7.7)
Neutrophils Relative %: 65.1 % (ref 43.0–77.0)
Platelets: 260 10*3/uL (ref 150.0–400.0)
RBC: 3.84 Mil/uL — ABNORMAL LOW (ref 3.87–5.11)
RDW: 15.6 % — ABNORMAL HIGH (ref 11.5–14.6)
WBC: 6.3 10*3/uL (ref 4.5–10.5)

## 2011-04-14 NOTE — Telephone Encounter (Signed)
Patient notified as instructed by telephone. 

## 2011-04-15 ENCOUNTER — Telehealth: Payer: Self-pay | Admitting: Cardiovascular Disease

## 2011-04-15 NOTE — Telephone Encounter (Signed)
Spoke to pt, she is asymptomatic at this time and BP normal. Advised pt that she may want to cut lasix if half for a while if BP low. [name] recent labs normal, no fluid retention indicated. Pt does also take Lasix for urinary retention, and it has been recommended she stay on Lasix due to incr fluid intake. So pt will cut in half and monitor BP, when back to normal she will incr back to normal dosage of Lasix.

## 2011-04-15 NOTE — Telephone Encounter (Signed)
Pt felt light headed last night.  BP was 90/55.  Today it is 115/65.

## 2011-04-16 ENCOUNTER — Telehealth: Payer: Self-pay | Admitting: *Deleted

## 2011-04-16 NOTE — Telephone Encounter (Signed)
Pt is complaining of pain in her right upper back, feels like the gall bladder attack she had in the past, thinks she is having another attack.  She has had the pain for 2-3 days but it is getting worse.  Per Dr. Para March and Dr. Milinda Antis advised pt to go to ER for evaluation and possible imaging.  Pt agreed.

## 2011-04-16 NOTE — Telephone Encounter (Signed)
Agree with above - thank you

## 2011-04-20 ENCOUNTER — Encounter: Payer: Self-pay | Admitting: Cardiothoracic Surgery

## 2011-04-21 ENCOUNTER — Encounter: Payer: Self-pay | Admitting: Family Medicine

## 2011-04-21 ENCOUNTER — Ambulatory Visit (INDEPENDENT_AMBULATORY_CARE_PROVIDER_SITE_OTHER): Payer: PRIVATE HEALTH INSURANCE | Admitting: Family Medicine

## 2011-04-21 DIAGNOSIS — F3289 Other specified depressive episodes: Secondary | ICD-10-CM

## 2011-04-21 DIAGNOSIS — I714 Abdominal aortic aneurysm, without rupture, unspecified: Secondary | ICD-10-CM

## 2011-04-21 DIAGNOSIS — F411 Generalized anxiety disorder: Secondary | ICD-10-CM

## 2011-04-21 DIAGNOSIS — F329 Major depressive disorder, single episode, unspecified: Secondary | ICD-10-CM

## 2011-04-21 DIAGNOSIS — M549 Dorsalgia, unspecified: Secondary | ICD-10-CM

## 2011-04-21 DIAGNOSIS — E785 Hyperlipidemia, unspecified: Secondary | ICD-10-CM

## 2011-04-21 DIAGNOSIS — K829 Disease of gallbladder, unspecified: Secondary | ICD-10-CM

## 2011-04-21 DIAGNOSIS — I10 Essential (primary) hypertension: Secondary | ICD-10-CM

## 2011-04-21 DIAGNOSIS — D649 Anemia, unspecified: Secondary | ICD-10-CM

## 2011-04-21 NOTE — Assessment & Plan Note (Signed)
Pt continues to have chronic cp and fatigue from resulting cardiomyopathy Will continue f/u with cardiol tomorrow May need HTN meds adj for occ low bp

## 2011-04-21 NOTE — Progress Notes (Signed)
  Subjective:    Patient ID: Rachel Torres, female    DOB: 1956-09-03, 55 y.o.   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  Respiratory: Negative for cough and pos for sob on exertion  Cardiovascular: Negative.   Gastrointestinal: Negative for nausea, diarrhea and constipation.  Genitourinary: Negative for urgency and frequency.  Skin: Negative for pallor.  Neurological: Negative for weakness, light-headedness, numbness and headaches.  Hematological: Negative for adenopathy. Does not bruise/bleed easily.  Psychiatric/Behavioral: Negative for dysphoric mood. The patient is not nervous/anxious.          Objective:   Physical Exam  Constitutional: She appears well-developed and well-nourished.       Obese and fatigued appearing  HENT:  Head: Normocephalic and atraumatic.  Eyes: Conjunctivae and EOM are normal. Pupils are equal, round, and reactive to light.  Neck: Normal range of motion. Neck supple. No JVD present. No thyromegaly present.       Heart M rad to carotids   Cardiovascular: Normal rate and regular rhythm.   Murmur heard. Pulmonary/Chest: Effort normal and  breath sounds normal. No respiratory distress. She has no wheezes. She has no rales. She exhibits no tenderness.  Abdominal: Soft. Bowel sounds are normal. She exhibits no distension and no mass. There is tenderness.       Some RUQ tenderness   Musculoskeletal: She exhibits no edema and no tenderness.  Lymphadenopathy:    She has no cervical adenopathy.  Neurological: She displays normal reflexes. No cranial nerve deficit. Coordination normal.  Skin: Skin is warm and dry. No rash noted. No erythema. No pallor.  Psychiatric:       Seems generally anxious and down  Talks much of situational stress and need for disability          Assessment & Plan:

## 2011-04-21 NOTE — Assessment & Plan Note (Signed)
appt upcoming for consideration of ccy at [facility] Perhaps this is adding to malaise and back pain

## 2011-04-21 NOTE — Assessment & Plan Note (Signed)
Stable Rev hosp records Rec low sat fat diet

## 2011-04-21 NOTE — Assessment & Plan Note (Signed)
This continues to worsen requiring narcotic pain med I stongly urged pt to f;u with her orthopedist for further eval and tx ? Back strain or other

## 2011-04-21 NOTE — Patient Instructions (Signed)
Anemia is better- this is reassuring  Proceed with the surgery appt for your gallbladder Follow up with your orthopedic for your severe back pain so we can find out what is going on  Talk to cardiologist-- about your low blood pressure and how you are feeling  Sign consent to send records to [employer] insurance company from sept of 2010  Fax is [phone] Follow up with me in 6 months unless needed earlier

## 2011-04-21 NOTE — Assessment & Plan Note (Signed)
Worse with situational stress and denial of disability  Continued psychiatric follow up

## 2011-04-21 NOTE — Assessment & Plan Note (Signed)
Gradual improvement of this after recovery from AAA surgery

## 2011-04-22 ENCOUNTER — Ambulatory Visit (INDEPENDENT_AMBULATORY_CARE_PROVIDER_SITE_OTHER): Payer: PRIVATE HEALTH INSURANCE | Admitting: Cardiovascular Disease

## 2011-04-22 ENCOUNTER — Encounter: Payer: Self-pay | Admitting: Cardiovascular Disease

## 2011-04-22 DIAGNOSIS — E785 Hyperlipidemia, unspecified: Secondary | ICD-10-CM

## 2011-04-22 DIAGNOSIS — D649 Anemia, unspecified: Secondary | ICD-10-CM

## 2011-04-22 DIAGNOSIS — Z954 Presence of other heart-valve replacement: Secondary | ICD-10-CM

## 2011-04-22 DIAGNOSIS — M549 Dorsalgia, unspecified: Secondary | ICD-10-CM

## 2011-04-22 DIAGNOSIS — K829 Disease of gallbladder, unspecified: Secondary | ICD-10-CM

## 2011-04-22 DIAGNOSIS — I428 Other cardiomyopathies: Secondary | ICD-10-CM

## 2011-04-22 DIAGNOSIS — K644 Residual hemorrhoidal skin tags: Secondary | ICD-10-CM

## 2011-04-22 DIAGNOSIS — I499 Cardiac arrhythmia, unspecified: Secondary | ICD-10-CM

## 2011-04-22 DIAGNOSIS — Z951 Presence of aortocoronary bypass graft: Secondary | ICD-10-CM

## 2011-04-22 DIAGNOSIS — I209 Angina pectoris, unspecified: Secondary | ICD-10-CM

## 2011-04-22 DIAGNOSIS — R42 Dizziness and giddiness: Secondary | ICD-10-CM

## 2011-04-22 DIAGNOSIS — I714 Abdominal aortic aneurysm, without rupture, unspecified: Secondary | ICD-10-CM

## 2011-04-22 DIAGNOSIS — I10 Essential (primary) hypertension: Secondary | ICD-10-CM

## 2011-04-22 DIAGNOSIS — R339 Retention of urine, unspecified: Secondary | ICD-10-CM

## 2011-04-22 NOTE — Progress Notes (Signed)
Patient ID: Rachel Torres, female    DOB: 09/30/1956, 55 y.o.   MRN: [id number]  HPI Comments: Rachel Torres is a very pleasant 55 year old woman who suffered an aorta type A dissection in September 2011 who was rushed to [facility] and underwent aortic valve replacement, bypass of the right coronary artery with venous donor site from her right thigh, aortic grafting for a extensive dissection of the  ascending aorta, residual descending aortic dissection extending into the iliac artery, with chronic back pain who presents for routine followup    She continues to have shortness of breath with exertion, back pain. She has abdominal swelling. She denies any significant chest pain at rest. Her difficulty with breathing, back pain, anxiety Has made it difficult for her to go back to work. She also reports having cognitive issues as her memory is not very clear. Her mother reports that she is often confused.  She was recently seen at [facility] for abdominal pain, further workup showing biliary stones and sludge. She is scheduled to have a laparoscopic cholecystectomy at [facility].    She takes Lasix for urinary retention. She denies any significant lower extremity edema   echocardiogram  shows moderately depressed LV systolic function estimated at 30-35%, hypokinesis of the anterior, anteroseptal wall, diastolic dysfunction, prosthetic valve is well seated with gradient of 11 mm mercury, 16 mm at peak, mild MR, mild to moderate TR with normal right ventricular systolic pressures   stress test did not show ischemia in the anterior wall. There is suggestion of mild ischemia near the lateral apex.   EKG shows normal sinus rhythm with rate 73 beats per minute, no significant ST or T wave changes, QTC 480 ms      Review of Systems  Constitutional: Positive for fatigue.  HENT: Negative.   Eyes: Negative.   Respiratory: Positive for shortness of breath.   Cardiovascular: Positive for chest pain.    Gastrointestinal: Negative.   Musculoskeletal: Negative.   Skin: Negative.   Neurological: Positive for dizziness, speech difficulty and weakness.       Memory problems.  Hematological: Negative.   Psychiatric/Behavioral: Negative.   All other systems reviewed and are negative.   BP 110/60  Pulse 73  Ht 5\' 5"  (1.651 m)  Wt 205 lb (92.987 kg)  BMI 34.11 kg/m2   Physical Exam  Nursing note and vitals reviewed. Constitutional: She is oriented to person, place, and time. She appears well-developed and well-nourished.  HENT:  Head: Normocephalic.  Nose: Nose normal.  Mouth/Throat: Oropharynx is clear and moist.  Eyes: Conjunctivae are normal. Pupils are equal, round, and reactive to light.  Neck: Normal range of motion. Neck supple. No JVD present.  Cardiovascular: Normal rate, regular rhythm, S1 normal, S2 normal and intact distal pulses.  Exam reveals no gallop and no friction rub.   Murmur heard.  Crescendo systolic murmur is present with a grade of 2/6  Pulmonary/Chest: Effort normal and breath sounds normal. No respiratory distress. She has no wheezes. She has no rales. She exhibits no tenderness.       Well-healed mediastinal scar  Abdominal: Soft. Bowel sounds are normal. She exhibits no distension. There is no tenderness.  Musculoskeletal: Normal range of motion. She exhibits no edema and no tenderness.  Lymphadenopathy:    She has no cervical adenopathy.  Neurological: She is alert and oriented to person, place, and time. Coordination normal.  Skin: Skin is warm and dry. No rash noted. No erythema.  Psychiatric: She has  a normal mood and affect. Her behavior is normal. Judgment and thought content normal.         Assessment and Plan

## 2011-04-22 NOTE — Patient Instructions (Signed)
You are doing well. No medication changes were made. Please call us if you have new issues that need to be addressed before your next appt.  We will call you for a follow up Appt. In 6 months  

## 2011-04-22 NOTE — Assessment & Plan Note (Signed)
She is followed at [facility] for her descending aortic dissection. Recent CT scan shows stable dissection.

## 2011-04-22 NOTE — Assessment & Plan Note (Signed)
She would be acceptable risk  for her upcoming gallbladder surgery either open or laparoscopic. She had a recent negative stress test. Blood pressure is well-controlled.

## 2011-04-22 NOTE — Assessment & Plan Note (Signed)
She continues to have rare episodes of chest pain. She did have a stress test that showed no significant ischemia. No further intervention is planned. Medical management has been recommended.

## 2011-04-22 NOTE — Assessment & Plan Note (Signed)
She does continue to have chronic back pain. She hopes that her cholecystectomy will help her symptoms. This certainly could be orthopedic in nature or secondary to her previous aorta repair and surgery and descending aortic dissection.

## 2011-04-22 NOTE — Assessment & Plan Note (Signed)
She has had a difficult time following her surgery trying to get back on track secondary to shortness of breath, chronic back pain, confusion, memory issues among other things. Echo shows well-seated valve is functioning as intended. She has had additional workup at [facility] including a recent CT scan that showed stable dissection of her descending aorta.

## 2011-05-03 ENCOUNTER — Encounter: Payer: Self-pay | Admitting: Family Medicine

## 2011-05-03 ENCOUNTER — Ambulatory Visit (INDEPENDENT_AMBULATORY_CARE_PROVIDER_SITE_OTHER): Payer: 59 | Admitting: Family Medicine

## 2011-05-03 ENCOUNTER — Telehealth: Payer: Self-pay | Admitting: *Deleted

## 2011-05-03 VITALS — BP 124/76 | HR 76 | Temp 98.5°F | Ht 65.0 in | Wt 208.1 lb

## 2011-05-03 DIAGNOSIS — J40 Bronchitis, not specified as acute or chronic: Secondary | ICD-10-CM | POA: Insufficient documentation

## 2011-05-03 MED ORDER — HYDROCOD POLST-CHLORPHEN POLST 10-8 MG/5ML PO LQCR: 5.0000 mL | Freq: Every evening | ORAL | Status: DC | PRN

## 2011-05-03 MED ORDER — CLARITHROMYCIN 500 MG PO TABS: 500.0000 mg | ORAL_TABLET | Freq: Two times a day (BID) | ORAL | Status: DC

## 2011-05-03 NOTE — Progress Notes (Signed)
  Subjective:    Patient ID: Rachel Torres, female    DOB: 05-03-1956, 55 y.o.   MRN: [id number]  HPI CC: URI?  4d h/o head congestion now spreading into chest.  + cough productive of green phlegm, worse at night.  Cough keeping her up at night.  Also started with ST, chills.  So far has tried mucinex dm which helps some.  HA initially.  + RN.  + wheezy, no SOB.  Seems to be worsening.  No ear pain, tooth pain, facial pain.  No fevers, abd pain, n/v/d, reashes, myalgias, arthralgias.  No sick contacts at home.  No smokers at home.  No h/o allergies.  + asthmatic bronchitis in past.  H/o thoracic aortic rupture and dissection to AAA 08/2010 now with prosthetic valve.  Saw surgeon last week to discuss likely upcoming cholecystectomy.  He wanted for pt to see PCP for concern for infection.  Medications and allergies reviewed and updated as above. PMHx reviewed for relevance.  Review of Systems Per HPI    Objective:   Physical Exam  Nursing note and vitals reviewed. Constitutional: She appears well-developed and well-nourished. No distress.  HENT:  Head: Normocephalic and atraumatic.  Right Ear: External ear normal.  Left Ear: External ear normal.  Nose: Nose normal. Right sinus exhibits no maxillary sinus tenderness and no frontal sinus tenderness. Left sinus exhibits no maxillary sinus tenderness and no frontal sinus tenderness.  Mouth/Throat: Oropharynx is clear and moist. No oropharyngeal exudate.  Eyes: Conjunctivae and EOM are normal. Pupils are equal, round, and reactive to light. No scleral icterus.  Neck: Normal range of motion. Neck supple. No thyromegaly present.  Cardiovascular: Normal rate, regular rhythm and intact distal pulses.   Murmur (3/6 SEM) heard. Pulmonary/Chest: Breath sounds normal. No respiratory distress. She has no wheezes. She has no rales.  Lymphadenopathy:    She has no cervical adenopathy.  Skin: Skin is warm and dry. No rash noted.           Assessment & Plan:

## 2011-05-03 NOTE — Telephone Encounter (Signed)
[facility] Triage Call Report Triage Record Num: [id number] Operator: Hillary Bow Patient Name: Rachel Torres Call Date & Time: 05/01/2011 9:35:27AM Patient Phone: [phone] PCP: Audrie Gallus. Tower Patient Gender: Female PCP Fax : Patient DOB: 1956/12/18 Practice Name: [facility] Reason for Call: Pt calling about having Coughing, HA, Sore Throat and Runny Nose, onset 04-30-11. Afebrile. All emergent sxs r/o per Sore Throat, URI and Cough Protocols. Consulted Dr Alphonsus Sias d/t Pt w/ HTN and AAA HX, needed advice on OTC Cold Meds Pt could take, MD advised Pt to take Acetaminophen and Robitussin DM for Cough. Advised Pt to be seen if Cough worsen, fever developed w/n 24 hrs. Pt verbalized understanding. Home care advise given. Protocol(s) Used: Cough - Adult Recommended Outcome per Protocol: See Provider within 24 hours Reason for Outcome: Facial pain (fullness, pressure, worsens with bending over), frontal headache, yellow-green nasal discharge AND any temperature elevation Care Advice: ~ 05/01/2011 9:51:50AM Page 1 of 1 [email or web address]

## 2011-05-03 NOTE — Patient Instructions (Signed)
Sounds like developing bronchitis. Treat with biaxin for 10 days. Tussionex for cough at night.  Don't take with percocets, ambien or xanax. Push fluids, plenty of rest. Update [place] if fever >101.5, worsening trouble breathing, or not improving as expected. Good to see you today, call us with questions.

## 2011-05-03 NOTE — Assessment & Plan Note (Signed)
Short duration but will treat aggressively given recent surgeries. Pt requests biaxin as this has worked well for her in past. tussionex for cough at night to prevent further strain on vascular repair. Cautioned against using with any other resp depressant.

## 2011-05-04 NOTE — Assessment & Plan Note (Signed)
NAME:  Rachel Torres, Rachel Torres NO.:  [id number]   MEDICAL RECORD NO.:  [id number]          PATIENT TYPE:  POB   LOCATION:  [facility] at [facility]         FACILITY:  [facility]   PHYSICIAN:  Elsie Lincoln, MD      DATE OF BIRTH:  December 08, 1956   DATE OF SERVICE:  03/15/2007                                  CLINIC NOTE   The patient is a 55 year old female who has had many years of  dyspareunia who has recently sought care.  She went into menopause in  her 29's.  Had vaginal dryness and sex became progressively more  painful.  Is now to the point where she shies away from all sexual  activity and she feels it is causing stress in her marriage.  She came  back in February when she was started on amitriptyline and Vagifem.  The  amitriptyline is now at 100 mg, but she is having some anticholinergic  side effects, so we are going to back it down to 50.  She is to continue  the Vagifem three times a week.  She is also using vaginal dilators  which she is only up to size 2.  I believe she needs physical therapy to  help relax her whole pelvic diaphragm.  She is also interested in seeing  a therapist both alone and with her husband.  We have made a referral to  the [facility] physical therapist today and also  gave her 3 names of therapists that she can call.   The patient has been very appreciative of the time we have spent  together.  I told her this would take at least a year to be able to  resolve because this problem did not occur overnight; it actually  occurred over a process of a decade.   The patient has tried Neurontin in the past for headaches and is unable  to take this medication also from side effects.  This is another  medication used for vulvodynia.  If we are unable to help her, I think  we can send her to Dr. Danton Sewer in [facility] and get his evaluation.  The patient is to come back in 6 weeks to see how she is doing.     ______________________________  Elsie Lincoln, MD     KL/MEDQ  D:  03/15/2007  T:  03/15/2007  Job:  [id number]

## 2011-05-04 NOTE — Op Note (Signed)
[name], [name] NO.:  [id number]   MEDICAL RECORD NO.:  [id number]          PATIENT TYPE:  INP   LOCATION:  5024                         FACILITY:  [facility]   PHYSICIAN:  Rachel Torres, M.D.DATE OF BIRTH:  10/11/1956   DATE OF PROCEDURE:  07/04/2008  DATE OF DISCHARGE:                               OPERATIVE REPORT   PREOPERATIVE DIAGNOSIS:  Comminuted complex interarticular fracture  dislocation of the shoulder with head splitting injury.   POSTOPERATIVE DIAGNOSIS:  .  Comminuted complex interarticular fracture dislocation of the shoulder  with head splitting injury.   PROCEDURES:  1. Right shoulder hemiarthroplasty with removal of the nonviable      humeral head.  This was a DePuy stem and head.  2. Biceps tendon tenodesis.   SURGEON:  Rachel Ano. Amanda Pea, MD   ASSISTANT:  Rachel Chimera, PA-C   COMPLICATIONS:  None.   ANESTHESIA:  General.   TOURNIQUET TIME:  Zero.   ESTIMATED BLOOD LOSS:  200 mL.   DRAINS:  One.   INDICATIONS FOR PROCEDURE:  [name] is a very pleasant 55-  year-old female who presents with the above-mentioned findings.  She  unfortunately had a head splitting type fracture and severe collapse  comminution and disarray.  She has subluxed the joint and due to the  significant issues as well as bone loss on CT scan, I have discussed  with her hemiarthroplasty.  Certainly, the patient lines are beautifully  or is able to be treated with an open reduction technique that would be  an option intraoperatively, however, given the history of this type  fracture and her age as well as activity level hemiarthroplasty is a  likely tube implant, she understands.   I have discussed the risks and benefits at great length including  bleeding, anesthesia, damage to normal structures, stiffness, and the  history of hemiarthroplasties in this fracture setting.  Typically, the  patient's will achieve approximately 50% of their  motion at best and  have some degree of stiffness.  However, I am going to give a her  functional extremity that she can use this first and foremost.   OPERATIVE PROCEDURE:  The patient was seen by myself and anesthesia  team, taken to the operative suite and underwent a smooth induction of  by Dr. Laverle Hobby and colleagues.  She was then taken to the  operative suite where preoperative antibiotics were given.  Time-out was  called and she was placed in beach-chair position with the [place] chair.  Following appropriate padding, I placed SCD hose on her to prevent  venous stasis and made sure body parts were well padded.  With this  performed, the patient was then underwent sterile prep and drape with  Betadine scrub, followed by DuraPrep, followed by sterile isolation  drape was accomplished.  Collier Flowers was placed against the skin after  markings were made.  The operation commenced with placement of 10 mL of  Marcaine with epinephrine into the skin edge, followed by deltopectoral  approach. The skin was incised.  Interval between the deltoid and the  pectoralis was made.  She had the cephalic vein taken with the deltoid  to allow for venous drainage of the deltoid.  Following this, I incised  the clavipectoral fascia, identified the musculocutaneous nerve with  fingertip technique and identified the fracture.  I opened the area  between the subscapularis areas with this bony attachments and the outer  shell of the cortical bone and greater tuberosity.  Stay sutures were  placed within the bone and tendon of the subscapularis, supra and  infraspinatus as well as a comminuted greater tuberosity, and lesser  tuberosity fractures.  The head was then excised and measured.  I  irrigated copiously.  The glenoid was intact without complicating  feature.  Following this, I exposed the shaft and 3 drill holes in  tuberosity fragments, I placed stay sutures in the rotator cuff and in  the shaft  itself.  This was done with drill hole technique.  FiberWire  of the #2 variety was used for this.  Following this, I then reamed to  size the 8 and placed the trial prosthesis in with the associated head  as measured off of her previous humeral head.  Following this, I then  performed careful look and scored the humerus where I wanted the  anterior and posterior pins to rest.  These were in between the biceps  sulcus.  The biceps tendon was attritionally torn and it was severed  from its superior labral attachment and later repaired with tenodesis.  Following this, we then irrigated copiously and  we placed the cement  restrictor followed by irrigation and packing of the canal with an  epinephrine soaked sponge.  The cement was then mixed and the DePuy stem  was then I placed.  I had previously measured the height to my  satisfaction with provisional trialing.  The cement was allowed to cure.  Following this, I then trialed the area once again, it looked excellent.  With all sutures preplaced including eyelet around the pins and I then  performed packing of bone graft around the greater lesser tuberosity  sites followed by ORIF of the lesser and greater tuberosity with ample  amounts of bone graft.  Both the tuberosity bony fragments and the  rotator cuff fragments were repaired.  This was done via 6 drill holes  in the humeral shaft and multiple drill holes in the tuberosity.  I was  able to achieve excellent repair and this area was amply bone grafted  with cancellous bone graft from the humeral head.  Following this, the  patient had the biceps tenodesis after appropriate tension was checked.  I was pleased with the findings.  Irrigation of 3 liters was  accomplished.  The patient had 3 liters of irrigation placed in the  wound and closure of the deltopectoral interval with Vicryl was placed.  The patient had excellent stability.  No complicating features.  Now, I  was quite pleased  with a hemiarthroplasty reconstruction.  Once this was  done, the skin was closed with Monocryl and the subcu with Vicryl.  A  Hemovac drain was placed to allow for the egress of fluid and I should  note that there no complicating features.  The patient was placed in a  abduction pillow and sling and was taken to recovery room after the  operation.  She will be admitted for IV antibiotics, postop pain  management, elevation, ice, and observation.   We will proceed according to a hemiarthroplasty protocol and I  will be  very careful with any subscapular tensioning or active motion initially.   It has been an absolute pleasure to see her and treat her and with her  for participating a postop care.      Rachel Ano. Everlene Other, M.D.     Nash Mantis  D:  07/04/2008  T:  07/05/2008  Job:  [id number]

## 2011-05-07 NOTE — Discharge Summary (Signed)
NAMEMICHALA, Rachel Torres              ACCOUNT NO.:  [id number]   MEDICAL RECORD NO.:  [id number]          PATIENT TYPE:  INP   LOCATION:  1413                         FACILITY[facility]  [facility]   PHYSICIAN:  Jonelle Sidle, M.D. LHCDATE OF BIRTH:  03/13/1956   DATE OF ADMISSION:  06/16/2005  DATE OF DISCHARGE:  06/17/2005                                 DISCHARGE SUMMARY   PROCEDURE:  None.   REASON FOR ADMISSION:  [name] is a 55 year old female, with no known  history of significant coronary artery disease by prior cardiac  catheterization in 1999, who presented to [facility] Emergency  Room with complaint of chest pain. She was admitted by Jonelle Sidle,  M.D. for overnight observation and further evaluation. Please refer to  dictated admission noted for full details.   LABORATORY DATA:  Normal CBC, INR, renal function, and liver enzymes on  admission. D-dimer 0.38. Cardiac markers:  Normal CPK/MB; troponin I 0.03,  0.02, BNP less than 30.   Admission chest x-ray:  Mild chronic bronchitic changes.   HOSPITAL COURSE:  The patient was admitted for overnight observation, and  ruled out for myocardial infarction with all serial cardiac markers within  normal limits.   The patient presented with history of supraventricular tachycardia, status  post RF ablation at [facility] in 1999; no dysrhythmias noted during this brief  stay.   The patient was cleared for discharge by Dr. Rollene Rotunda, the following  morning. He felt her chest pain was atypical and recommended discharge with  same day outpatient exercise stress test at [facility], later that  morning.   If stress test is negative, the patient is instructed to return to her  primary cardiologist, Dr. Mariel Kansky, for followup.   DISCHARGE MEDICATIONS:  1. Toprol XL 50 mg daily.  2. Protonix 40 mg b.i.d.   DISCHARGE INSTRUCTIONS:  1. Proceed with scheduled same day exercise stress test at [facility],      at  12 noon.  2. Arrange followup with Dr. Mariel Kansky in two weeks.   DISCHARGE DIAGNOSES:  1. Atypical chest pain.      a. Normal serial cardiac markers.      b. History of negative cardiac catheterization in 1999.  2. Aspirin intolerance.  3. History of SVT.      a. RF ablation in 1999 [facility]).  4. Hypertension.  5. History of hyperlipidemia.  6. Gastroesophageal reflux disease.  7. History of migraines.      Gene   GS/MEDQ  D:  06/17/2005  T:  06/17/2005  Job:  [id number]   cc:   Mariel Kansky, MD  [place], [place]   Charlene Scott  [address]  [place]  [place] [address]  Fax: [phone]

## 2011-05-07 NOTE — Assessment & Plan Note (Signed)
[facility]                         GASTROENTEROLOGY OFFICE NOTE   Rachel Torres, Rachel Torres                       MRN:          [id number]  DATE:12/22/2006                            DOB:          10/16/1956    CHIEF COMPLAINT:  Problem of acid reflux.   She is still having heartburn problems.  She is on Prevacid 30 mg b.i.d.  She has both regurgitation and heartburn symptoms.  Today she is having  regurgitation.  She did have some dietary indiscretion over the holidays  with chocolate.  She has not raised the head of her bed, she is using  some extra pillows.  Nothing as far as PPI therapy has ever completely  controlled her symptoms.  She had a 4-cm hiatal hernia.  I thought she  might have Barrett's esophagus, this was not confirmed.  She had some  benign fundic gland polyps.  She is having some arthritis symptoms and  has used a little bit of ibuprofen.  She is concerned about that it  bothers her stomach if she does not take it with food.   Height 5 foot 5 inches, weight 190, pulse 85, blood pressure 120/80.   ASSESSMENT:  1. Gastroesophageal reflux disease with reflux esophagitis and hiatal      hernia.  Seems to be refractory to twice daily proton pump      inhibitor therapy.  She is taking the evening dose of Prevacid      often before bedtime as she forgets to take it before supper.  2. Osteoarthritis complaints.   RECOMMENDATIONS AND PLAN:  1. She can use some NSAIDS such as ibuprofen or Aleve as needed.  I      would take them with food.  The odds that these are going to cause      serious problems for her disease or an ulcer are pretty low while      she is on the b.i.d. PPI.  2. She needs a fasting gastrin level.  She has eaten today, she is      going to see Dr. Roxy Manns for the first time next week and I      have given her a prescription to take to their office to get that      lab drawn.  3. Assuming that is okay I would perform  a bravo capsule test on this      lady on medication to see what her pH is, i.e., is she controlling      her acid reasonably well.  She may have weakly acidic or nonacid      reflux as part of her problem.  She may be a candidate for gastric      emptying study and/or metoclopramide therapy (she was on that in      past, can not remember how much it helped) or possible      fundoplication procedure.  Testing for weakly acidic or nonacid      reflux with impedence monitoring is a possibility that would have      to be done at [facility]  or another tertiary center.   Other considerations might be changing from her calcium channel blocker  therapy, which could be contributing to reflux as well.     Iva Boop, MD,FACG  Electronically Signed    CEG/MedQ  DD: 12/22/2006  DT: 12/22/2006  Job #: [id number]   cc:   Marne A. Milinda Antis, MD

## 2011-05-07 NOTE — H&P (Signed)
NAME:  Rachel Torres, Rachel Torres NO.:  [id number]   MEDICAL RECORD NO.:  [id number]          PATIENT TYPE:  EMS   LOCATION:  ED                           FACILITY:  [facility]   PHYSICIAN:  Michaelyn Barter, M.D. DATE OF BIRTH:  [birth year]   DATE OF ADMISSION:  02/11/2006  DATE OF DISCHARGE:                                HISTORY & PHYSICAL   PRIMARY CARE PHYSICIAN:  Dr. Dale Alcalde in [place]; therefore, she is  unassigned.   CARDIOLOGIST:  [employer].   CHIEF COMPLAINT:  Chest pain.   HISTORY OF PRESENT ILLNESS:  Rachel Torres is a 55 year old female with the  past medical history of supraventricular tachycardia status post ablation,  hypertension, hypercholesterolemia who states that she had an episode of  chest pain that started at approximately 11:30 a.m. earlier yesterday.  The  pain was described as centrally located and characterized as a heaviness  that traveled to her back.  Her arms felt slightly heavy bilaterally.  She  states that she went to the drug store to have her blood pressure checked,  and it was found to be normal.  She was told to drink some water.  However,  this did not help her to feel any better.  She was brought to the ER for  evaluation.  Her pain, again, is described as being constant.  However, its  intensity waxes and wanes.  There are no aggravating factors.  She was  started on nitroglycerin while in the emergency department, and she states  that that has had only minimal effect.  She went on to state that she has  had similar pain before approximately six months ago when she came to the  hospital for eval.  At that time, she was told that she possibly had  coronary spasms.  She also complains of some nausea.  No emesis.  No fevers  or chills.  No shortness of breath.  She states that the pain does travel up  her neck.  She went on to state that she did have a stress test completed in  June of 2006, and it was negative.   PAST  MEDICAL HISTORY:  1.  Supraventricular tachycardia status post ablation at [facility] in 1999.  2.  Hypertension.  3.  High cholesterol.  4.  Questionable coronary artery spasms.  5.  Questionable bivalve.  6.  Asthma.  7.  Insomnia.  8.  Anxiety.   PAST SURGICAL HISTORY:  C section x 2.   ALLERGIES:  PREDNISONE produces a rash.  PENICILLIN.  The patient is not  sure of what the reaction is.   HOME MEDICATIONS:  1.  Protonix.  2.  Toprol XL 50 mg once a day.  3.  Amitriptyline 50 mg once a day.   SOCIAL HISTORY:  Cigarettes:  The patient smoked for one year at the age of  48.  No longer smokes.  Alcohol:  Occasional wine.   FAMILY HISTORY:  Mother had a history of asthma.  Father died at the age of  33 in a boating accident.  The patient has two  uncles both of whom died from  heart conditions; one at the age of 6, the other while in his 11s.   REVIEW OF SYSTEMS:  As per HPI.  Otherwise, all other systems are negative.   PHYSICAL EXAMINATION:  VITALS:  Temperature is 96.7, blood pressure 133/94,  heart rate 86, respirations 20, and O2 sat 98% on room air.  HEENT:  Anicteric.  Extraocular movements are intact.  Pupils are equally  reactive to light.  Oral mucosa is pink.  No exudates.  NECK:  No JVD.  Soft carotid upstrokes bilaterally.  No bruits.  Thyroid is  not palpable.  CARDIAC:  S1 and S2 are present.  Regular rate and rhythm.  No murmurs, no  gallops, no rubs.  RESPIRATORY:  Clear.  ABDOMEN:  Soft, nontender, and nondistended.  Positive bowel sounds.  EXTREMITIES:  No edema.  NEUROLOGICAL:  The patient is alert and oriented x 3.  Cranial nerves II-XII  are intact.  MUSCULOSKELETAL:  Five out of five upper and lower extremity strength.   LABORATORIES:  CK-MB, POC less than 1 x 3.  Troponin I, POC less than 0.05 x  3.  Myoglobin, POC 53.4, 54.1, 53.3.  White blood cell count 7.5, hemoglobin  12.5, hematocrit 36.1, platelets 281.  D-dimer at 0.44.  Sodium is 139,   potassium 3.8, chloride 105, CO2 is 27, BUN 8, creatinine 0.9, glucose 102.  Bilirubin total 0.4, alk phos 72, SGOT 18, SGPT 18, total protein 7.2,  albumin 4, calcium 9.2, lipase 24.  EKG was completed, and it reveals first-  degree AV block, sinus rhythm, rate within the 60s, good R wave progression,  T wave inversions in lead V1 only, no obvious Q waves, no ST abnormalities  other than the T wave inversion in lead V1.   ASSESSMENT/PLAN:  Rachel Torres is a 55 year old female who has a history of  chest pain here for evaluation of chest pain.  1.  Chest pain.  The etiology of the patient's current chest pain is cardiac      versus non cardiac in origin.  Will rule the patient out for an ACS via      troponin I and CK-MB x 3 q.8h. apart.  Will provide morphine, oxygen,      nitroglycerin, and aspirin on a p.r.n. basis.  Will consult cardiology      in the a.m.  2.  Hypertension.  This currently is stable.  Will resume the patient's      previously prescribed home medications.  3.  History of hyperlipidemia.  Will check a fasting lipid profile.  4.  Gastrointestinal prophylaxis.  Will provide Protonix.  5.  Deep venous thrombosis prophylaxis.  Will provide Lovenox.      Michaelyn Barter, M.D.  Electronically Signed     OR/MEDQ  D:  02/11/2006  T:  02/11/2006  Job:  [id number]

## 2011-05-07 NOTE — Discharge Summary (Signed)
NAMEERRICA, DUTIL NO.:  [id number]   MEDICAL RECORD NO.:  [id number]          PATIENT TYPE:  INP   LOCATION:  5024                         FACILITY:  [facility]   PHYSICIAN:  Dionne Ano. Gramig III, M.D.DATE OF BIRTH:  1956/06/24   DATE OF ADMISSION:  07/04/2008  DATE OF DISCHARGE:  07/08/2008                               DISCHARGE SUMMARY   Ms. Laylanie Kruczek is a pleasant 55 year old female who presented with 4-  part intra-articular shoulder fracture.  She was taken to the operative  arena on July 04, 2008 and underwent a hemiarthroplasty about the right  shoulder with biceps tenodesis.  She tolerated the procedure well and  there were no complicating features postoperatively.  She had DePuy  global fracture stem with a global advantage humeral head size 44 x 18  placed and the cement restrictor.  The patient postoperatively was  managed with postop antibiotics, IV fluids, and careful observatory  measures.  She did well throughout her hospital course.  Her diet was  advanced.  Foley was ultimately discontinued and she remained  neurovascularly intact by arm.  She was seen by Occupational Therapy and  did well in terms of her ADLs.   At the time of her discharge on postop day #4, she was awake, alert, and  oriented, tolerating regular diet, had stable vital signs.  Abdomen was  nontender and nondistended, and she had voided and was using the  bathroom well.  Her lungs were clear.  Heart was regular rate and she  felt comfortable to transfer to home.   FINAL DIAGNOSIS:  Four-part intra-articular right shoulder fracture  treated with hemiarthroplasty.   DISCHARGE MEDICINES:  1. Dilaudid 2 mg 1-2 q.4-6 h. p.r.n. pain p.o.  2. Robaxin 500 mg 1 p.o. q.6-8 h. p.r.n. spasm.  3. Vitamin C 1000 mg p.o. daily.  4. Peri-Colace 1 p.o. b.i.d.  5. Milk of magnesia p.r.n.   She is going to call me and see me in 10 days for suture removal and  initiate therapeutic  endeavors.  I have arranged home PT through [facility] and  discussed with her all issues, the do's and do not's, etc.  She was  stable at the time of discharge without complicating features.  She  remained afebrile throughout her hospital course and stable vital signs.  It has been an absolute pleasure to see and treat her and we wish her  the best for the future.      Dionne Ano. Everlene Other, M.D.     Nash Mantis  D:  08/20/2008  T:  08/21/2008  Job:  [id number]

## 2011-05-07 NOTE — Discharge Summary (Signed)
NAME:  Rachel Torres, Rachel Torres              ACCOUNT NO.:  [id number]   MEDICAL RECORD NO.:  [id number]          PATIENT TYPE:  INP   LOCATION:  3715                         FACILITY:  [facility]   PHYSICIAN:  Mobolaji B. Bakare, M.D.DATE OF BIRTH:  1956/08/15   DATE OF ADMISSION:  02/11/2006  DATE OF DISCHARGE:  02/11/2006                                 DISCHARGE SUMMARY   PROCEDURES:  None.   FINAL DIAGNOSIS:  1.  Chest pain secondary to coronary spasm.  Medications were adjusted.  2.      Hyperlipidemia.  3. Hypertension, controlled.  4. Headache secondary to      nitroglycerine.   SECONDARY DIAGNOSES:  1.  Supraventricular tachycardia, status post ablation in 1999.  2. Anxiety.      3. Insomnia.  4. Asthma.   BRIEF HISTORY:  The patient presented on the day of admission with chest  pain.  [name] is a 55 year old Caucasian female who presented with chest  pain.  She told us that she had had nausea, but no vomiting, no shortness of  breath, palpitations, dizziness, she was admitted to rule out myocardial  infarction and cardiology consult was obtained.   HOSPITAL COURSE:  The hospital course was quite brief.  The patient ruled  out with 2 negative sets of cardiac enzymes.  EKG was normal sinus rhythm  without any acute ST changes.  She was seen by cardiology, Jesse Sans. Wall,  M.D. , who thought that this is most likely coronary spasm.  The patient was  a picture of coronary spasm.  Medications were changed.  Her beta blocker  was tapered off.  The patient was started on Norvasc.   She developed a headache secondary to the nitroglycerin drip.  The  nitroglycerin drip was discontinued.  She was given Tylenol for headaches  and this improved.   DISCHARGE CONDITION:  She was discharged in stable condition, chest pain  free.   DISCHARGE MEDICATIONS:  1.  Toprol-XL, reduced Toprol-XL to 25 mg daily for 7 days and then stop.      2. Norvasc 5 mg daily for 7 days then increase to 10 mg  daily.  3.      Enteric-coated aspirin 81 mg daily.  4. The patient was instructed to      continue Elavil 25 mg as before.   SPECIAL INSTRUCTIONS:  She could begin exercise program.   LABORATORY DATA:  Cholesterol, lipid profile, cholesterol 191, triglycerides  109, HDL 29, LDL 128.  White cells 6.2, hemoglobin 11.9, hematocrit 35, MCV  91, platelets 272.  Normal differential.  Cardiac markers were negative.  D-  dimer 0.44.      Mobolaji B. Corky Downs, M.D.  Electronically Signed     MBB/MEDQ  D:  02/23/2006  T:  02/23/2006  Job:  [id number]   cc:   Dr. Berton Lan  [facility].   Dale Dunlo  Fax: [phone]
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
## 2011-05-07 NOTE — Consult Note (Signed)
NAME:  Rachel Torres, Rachel Torres              ACCOUNT NO.:  [id number]   MEDICAL RECORD NO.:  [id number]          PATIENT TYPE:  INP   LOCATION:  3715                         FACILITY:  [facility]   PHYSICIAN:  Jesse Sans. Wall, M.D.   DATE OF BIRTH:  [birth year]   DATE OF CONSULTATION:  02/11/2006  DATE OF DISCHARGE:                                   CONSULTATION   CONSULTING PHYSICIAN:  Jesse Sans. Wall, M.D.   PRIMARY CARDIOLOGIST:  Dr. Mariel Kansky at [facility].   LOCAL CARDIOLOGIST:  In the past has been Rollene Rotunda, M.D.   PRIMARY CARE PHYSICIAN:  Dr. Dale Avalon at [facility] in  [place].   REASON FOR CONSULTATION:  Chest pain.   Rachel Torres is a 55 year old Caucasian female who presented to [facility]  emergency room during the night complaining of some chest discomfort.  The  patient was admitted by the [facility] team, transferred to [facility] to a telemetry unit.  Rachel Torres has a history of SVT, status post  an ablation at [facility] in 1999.  However, at that time Rachel Torres states she  continued to have some chest discomfort and was seen by Dr. Lady Gary at [facility] for cardiac catheterization which also occurred in  1999, at which time Rachel Torres states she was told that she had clean  coronary arteries, however, experienced some chest discomfort during her  catheterization apparently and was told that she was having coronary spasms.  Rachel Torres had not had any further discomfort until 2006, where she  presented to [facility] once again complaining of chest  discomfort.  We actually consulted on the patient at that time, had a  negative cardiac workup, with normal cardiac markers and normal EKG.  Once  again, the impression was that the patient had a coronary spasm, however, we  proceeded with an stress Myoview on June 17, 2005.  It showed no evidence of  ischemia or infarction with __________  ejection fraction of  67% and normal  wall motion.  The patient was discharged home, has not had any further chest  discomfort since that time, until she presented yesterday with complaint of  chest pressure.  She states the onset was around 11 a.m. while she was  working.  She works in a Lobbyist.  She states she had substernal chest  pressure and heaviness radiating through to her back.  The patient states  that it felt just like her discomfort when she was having coronary spasms  during her cardiac catheterization in 1999.  She states the chest discomfort  continued for several hours, was not relieved until she received  nitroglycerin sublingual.  She rates the chest discomfort around an eight at  that time.  She also complained of some dizziness.  Denied any shortness of  breath, nausea or vomiting.  She is currently pain free and resting quietly.  However, she does complain of a migraine headache which has been relieved  with food and Vicodin.   ALLERGIES:  1.  PREDNISONE.  2.  PENICILLIN.   MEDICATIONS:  1.  Elavil 50 mg at bedtime.  2.  Toprol XL 50 mg daily.  3.  Protonix.  4.  She uses an inhaler p.r.n. which is very infrequent she states.  Here      she is currently also on,  5.  Lovenox 40 mg.  6.  Aspirin 325 mg in addition to her Elavil, and Toprol, and Protonix.   PAST MEDICAL HISTORY:  1.  Cardiac catheterization in 1999 at [facility].      Clean, per patient report.  2.  Status post ablation secondary to SVT at [facility] in 1999.  3.  Stress Myoview, in June 2006, with a normal EF.  4.  Hypertension.  5.  Hyperlipidemia - diet controlled.  6.  Asthma.  7.  Anxiety with depression.  8.  Migraines.  9.  GERD.  10. Irritable bowel syndrome.  11. C-section x2.  12. The patient also reports she was told she had a bivalve and a hold in      her heart back in 1999.   SOCIAL HISTORY:  She lives in [place] with her husband.  She works in a  Engineer, structural.  She has two children.  She denies any tobacco use.  She  states she smoked for approximately one year at age 68 and quit.  No routine  exercise.  ETOH, she has approximately two glasses of wine per week.   DIET:  She tries to follow a low fat, low salt, low cholesterol diet.   Denies any drug or herbal medication use.   FAMILY HISTORY:  Mother alive with asthma and GERD.  Father deceased at age  79 secondary to motor vehicle accident.  She has two paternal uncles who are  both deceased in their late 72s and early 86s secondary to coronary artery  disease.  She has siblings, a sister with hypertension, asthma, questionable  thyroid problems.   REVIEW OF SYSTEMS:  Positive for occasional sweats, weight change secondary  to diet.  Chest pain as described above.  GU:  The patient is post  menopausal.  History of some depression.  The patient states this is  controlled with her Elavil.  Positive for some arthralgia in right knee.  ENDO:  Positive for heat intolerance.  All other systems negative per  patient.   PHYSICAL EXAMINATION:  VITAL SIGNS:  Temp 97.6, pulse is 72, respirations  16, blood pressure ranging from 118/89 to 133/94.  The patient is sating 98%  on 2 liters.  GENERAL:  She is in no acute distress, complaining of a headache.  HEENT:  Pupils are equal, round, and reactive to light.  Sclerae clear.  Mucous membranes moist.  Dentition:  Teeth are healthy.  NECK:  Supple without lymphadenopathy.  Negative bruit or JVD.  CARDIOVASCULAR:  Heart rate regular rhythm S1 and S2.  Pulses are 2+ and  equal without bruits.  LUNGS:  Clear to auscultation bilateral.  ABDOMEN:  Soft and nontender.  Positive bowel sounds.  EXTREMITIES:  Without clubbing, cyanosis, or edema.  She has 2+ DPs  bilateral.  NEUROLOGIC:  She is alert and oriented x3.  Cranial nerves II-XII grossly  intact.  Chest x-ray showing no acute disease.  EKG at a rate of 71, normal sinus  rhythm, with a  borderline first degree.   LABORATORY:  CBC:  Hemoglobin 11.9, hematocrit 35, WBC 6.2, platelets  272,000.  Sodium 141, potassium 3.9, BUN 7,  creatinine 0.9 with a glucose of  105.  AST 18, ALT is 18.  Lipase 24.  D-dimer 0.44.  PT 14.0 with an INR of  1.1.  Total cholesterol 199.  Triglycerides 109.  HDL 39, LDL 138.  First  set of cardiac markers, troponin 0.01.   Dr. Juanito Doom in to examine and assess the patient with complaints of chest  pain and with no acute changes on EKG, point-of-care markers negative, first  set of cardiac enzymes negative, possibly secondary to coronary spasm  with  known history of hypertension, hyperlipidemia, and migraines.   RECOMMENDATIONS:  1.  Start the patient on enteric coated aspirin 81 mg.  2.  Wean the patient off beta-blocker.  3.  Start a calcium channel blocker at 5 mg and increase to 10 mg within x1      week.  4.  Check a TSH level on her.  5.  Also recommend starting a low dose Statin.  This can be done outpatient      by primary care physician.  6.  If the patient's next set of cardiac markers are negative, would      recommend discharging the patient home.  Followup with Dr. Lady Gary next      week and Dr. Lorin Picket for primary care issues.  7.  The patient also encouraged to continue with lifestyle modification,      i.e., weight reduction, begin an exercise program.      Dorian Pod, NP      Jesse Sans. Wall, M.D.  Electronically Signed    MB/MEDQ  D:  02/11/2006  T:  02/11/2006  Job:  [id number]   cc:   Lady Gary, Dr.   [place]  Fax: [phone]

## 2011-05-07 NOTE — Assessment & Plan Note (Signed)
[facility]                           GASTROENTEROLOGY OFFICE NOTE   SHEY, YOTT                       MRN:          [id number]  DATE:11/01/2006                            DOB:          07-12-1956    CHIEF COMPLAINT:  Reflux.   HISTORY OF PRESENT ILLNESS:  A 55 year old white woman that has had a long  history of heartburn problems and diagnosis of gastroesophageal reflux  disease for 20 years.  Apparently she has had an endoscopy more than 5 years  ago.  Upper GI series before that.  No dysphagia or stricture problems that  I am aware of.  She has been on Protonix for a number of years, prior to  that Prilosec, and recently went to AcipHex in the last year or so.  She  still has heartburn symptoms day or night, though, this is intermittent.  Nothing has ever completely controlled symptoms of heartburn and  regurgitation.  She says that she has had pH probe testing at [facility] that was  markedly positive.  Her mother has had two Nissen fundoplications which have  not helped her problems and her grandmother apparently had reflux problems  for many years and eventually had adenocarcinoma of the stomach it sounds  like.  The patient does drink a little bit of caffeine and eats some  chocolate, describing herself as a chocoholic.  She does not smoke.  She has  not raised the head of her bed.  Sometimes, she will have difficulty  sleeping, but most of the time she is able to sleep without reflux  difficulty.  She also has alternating bowel habits and carries a diagnosis  of irritable bowel syndrome.  Her grandmother had colon cancer and her  mother had colon polyps.  She has had two colonoscopies, the last less than  5 years ago without polyps, performed by Dr. Mechele Collin in [place].   MEDICATIONS:  1. AcipHex 20 mg 10-30 minutes before breakfast.  2. Amitriptyline 50 mg q.h.s.  3. Acidophilus daily.  4. Multivitamin daily.  5. Amlodipine 5 mg  daily.  6. Promethazine 25 mg p.r.n.  7. Zebutal.   ALLERGIES:  PREDNISONE.  Possible allergy to AUGMENTIN, AMOXICILLIN.   PAST MEDICAL HISTORY:  Hypertension, reflux disease, reactive airways  disease when she has infections of the pulmonary tract,  dysrhythmia/palpitations, dyslipidemia, osteoarthritis, anxiety, depression,  possible nephrolithiasis as a child, chronic headaches, sleep apnea, prior  cesarean section. Possible coronary artery spasm causing pain.   FAMILY HISTORY:  Heart disease in grandmother and mother, diabetes in  grandparents, otherwise as above.  Her father's side all died young and she  does not know their history.   SOCIAL HISTORY:  She is married, works at [employer], two sons.  She uses  some alcohol, but no tobacco or drugs.   REVIEW OF SYSTEMS:  See medical history form for full details.   PHYSICAL EXAMINATION:  GENERAL:  A pleasant, middle-age white woman in no  acute distress.  VITAL SIGNS:  Weight 188 pounds, height 5 feet 5 inches, blood pressure  112/80, pulse 80.  HEENT:  Eyes anicteric.  Normal mouth and oropharynx.  NECK:  Supple without masses.  CHEST:  Clear.  HEART:  S1 and S2.  ABDOMEN:  Soft and nontender.  Normal bowel sounds.  RECTAL:  Hemoccult negative brown stool, no mass.  Female nursing staff  present.  LYMPHATIC:  No neck or supraclavicular nodes.  EXTREMITIES:  No edema.  SKIN:  No rash.  NEUROLOGY:  She is alert and oriented x3.   NOTE:  The patient had described some minor intermittent rectal bleeding.  She can go years without, though, recently she had some with a hard stool.  I attribute that to her hemorrhoids.  She has a history of those and she did  not have any mass on rectal examination but there were some protruding  external hemorrhoids that seemed to be quiescent at this time.   CBC November 30, 2005, is normal.  CMET normal at that time.  Lipid panel  shows cholesterol 226, triglycerides 118, VLDL 24, LDL 163  at that time.   ASSESSMENT:  1. Heartburn and reflux symptoms, despite medication.  There is a strong      family history of reflux problems as well, though interestingly a      fundoplication has not helped her mother.  2. Rectal bleeding, which I think is hemorrhoidal.  3. Family history of colon cancer in a second degree relative and colon      polyps in a first degree relative.  It sounds like her screening      colonoscopies are up to date.   PLAN:  1. Request records from Dr. Markham Jordan regarding prior colonoscopies and any      upper endoscopies.  2. It has been a number of years since she has had an upper endoscopy and      I think it would be appropriate to check for a large hiatal hernia and      other potential esophagitis.  Despite being on AcipHex, she is still      symptomatic.  Risks, benefits, and indications explained; she      understands and agrees to proceed.  3. Pending that, may need Bravo pH study, may need an upper GI series.      May need an esophageal manometry.  May need a gastric emptying study.   Nonacid reflux is a consideration as well, so referral for impedance testing  at [facility] is a consideration also.  She does not seem to have symptoms only  at night, i.e. the symptoms  occur within hours of taking the AcipHex, so it is not simply a wearing off  phenomenon of the AcipHex or other PPIs.     Iva Boop, MD,FACG  Electronically Signed    CEG/MedQ  DD: 11/01/2006  DT: 11/01/2006  Job #: [id number]   cc:   [place]

## 2011-05-21 ENCOUNTER — Encounter: Payer: Self-pay | Admitting: Cardiothoracic Surgery

## 2011-06-07 ENCOUNTER — Other Ambulatory Visit: Payer: Self-pay | Admitting: *Deleted

## 2011-06-07 MED ORDER — OXYCODONE-ACETAMINOPHEN 5-325 MG PO TABS: 1.0000 | ORAL_TABLET | Freq: Four times a day (QID) | ORAL | Status: DC | PRN

## 2011-06-07 NOTE — Telephone Encounter (Signed)
Patient is asking for a refill to last just until her orthopedic dr. Stann Mainland fill it for her. He will not fill it until she has her MRI. Her MRI is scheduled for wed. And she hopes to follow up with her orthopedic dr. On Monday.

## 2011-06-07 NOTE — Telephone Encounter (Signed)
Patient notified as instructed by telephone. 

## 2011-06-07 NOTE — Telephone Encounter (Signed)
I will give her enough to get by until Thursday After that it is her ortho's resposibility to fill based on whether they think it is appropriate therapy Px printed for pick up in IN box

## 2011-06-09 ENCOUNTER — Encounter: Payer: Self-pay | Admitting: Cardiovascular Disease

## 2011-06-10 ENCOUNTER — Telehealth: Payer: Self-pay | Admitting: *Deleted

## 2011-06-10 NOTE — Telephone Encounter (Signed)
Pt wanted to let Dr. [name] know that she had a thoracic MRI yesterday through [facility], and wanted to see if he wanted to review for h/o aortic dissection.

## 2011-06-15 ENCOUNTER — Encounter: Payer: Self-pay | Admitting: Emergency Medicine

## 2011-06-18 ENCOUNTER — Telehealth: Payer: Self-pay | Admitting: Family Medicine

## 2011-06-18 NOTE — Telephone Encounter (Signed)
Please let pt know that I read her ortho note -- and I will be the one to continue her pain medication  Ask her how it is working and tell her to let me know when she needs refils Also ask if she is interested in stepping down to something less strong at this time

## 2011-06-18 NOTE — Telephone Encounter (Signed)
Patient notified as instructed by telephone. Pt will let Dr Milinda Antis know when she needs more refills. Pt said she would be interested in med less strong. Pt said she cannot take Vicodin. Pt also said for Dr Royden Purl info she is scheduled on 07/01/11 to get 2 cortisone injections into the pelvis and hopes that will elevate some of the pain. Thank you.

## 2011-06-20 ENCOUNTER — Encounter: Payer: Self-pay | Admitting: Cardiothoracic Surgery

## 2011-06-28 ENCOUNTER — Other Ambulatory Visit: Payer: Self-pay | Admitting: *Deleted

## 2011-06-28 MED ORDER — CYCLOBENZAPRINE HCL 10 MG PO TABS: 10.0000 mg | ORAL_TABLET | Freq: Three times a day (TID) | ORAL | Status: DC | PRN

## 2011-06-28 MED ORDER — OXYCODONE-ACETAMINOPHEN 5-325 MG PO TABS: 1.0000 | ORAL_TABLET | Freq: Four times a day (QID) | ORAL | Status: DC | PRN

## 2011-06-28 NOTE — Telephone Encounter (Signed)
Please call pt when ready.

## 2011-06-28 NOTE — Telephone Encounter (Signed)
Patient notified as instructed by telephone. Prescription left at front desk.  Pt said today her pain has been mainly in neck and shoulder and before taking Percocet was pain level 7. Pt is to get injections on Thurs 07/01/11. And has appt to see rheumatologist on 07/06/11.

## 2011-06-28 NOTE — Telephone Encounter (Signed)
Let me know how her pain control is at this time and if she has had her injections yet  I don't want to keep her on percocet for a long time because it is a very strong narcotic Px printed for pick up in IN box

## 2011-07-21 ENCOUNTER — Encounter: Payer: Self-pay | Admitting: Cardiothoracic Surgery

## 2011-07-26 ENCOUNTER — Telehealth: Payer: Self-pay | Admitting: *Deleted

## 2011-07-26 NOTE — Telephone Encounter (Signed)
Pt is asking if she can have clearance for [place] scheduled on 08/09/11.

## 2011-07-27 NOTE — Telephone Encounter (Signed)
Ok to write a note to excuse her

## 2011-07-28 ENCOUNTER — Encounter: Payer: Self-pay | Admitting: *Deleted

## 2011-07-28 NOTE — Telephone Encounter (Signed)
Spoke to pt, notified her we will mail her letter for clearance.

## 2011-08-11 ENCOUNTER — Telehealth: Payer: Self-pay | Admitting: *Deleted

## 2011-08-11 NOTE — Telephone Encounter (Signed)
Pt states she has been losing her balance and falling a lot lately.  She is having some dizziness if she bends over or moves too fast. She is going to see vascular surgeon on Friday and will get their opinion on this as well.  She is asking if these symptoms can have anything to do with her recent surgery.

## 2011-08-12 NOTE — Telephone Encounter (Signed)
Message left notifying patient. Instructed to call with update after vascular surgeon visit.

## 2011-08-12 NOTE — Telephone Encounter (Signed)
Advised pt, she says she did not get a message.

## 2011-08-12 NOTE — Telephone Encounter (Signed)
I doubt that would be related to her surgery, my only thought would be if she had lost a lot of blood and was very anemic (her last cbc here was fine) Talk to the vascular surgeon as planned and then update me please - I may have to have her f/u

## 2011-08-16 ENCOUNTER — Telehealth: Payer: Self-pay | Admitting: *Deleted

## 2011-08-16 NOTE — Telephone Encounter (Signed)
Patient called today with an update from her vascular surgeon at [facility]. She said he told her that her pulse is weaker in her right arm and left leg. He is getting an [place] (presumed carotid and femoral) to determine the cause of that. He also told her that her dissection went all the way into her left leg. She requested a CPX with you because it's been awhile since she had one. I scheduled that and told her we would call if there were anymore questions before her CPX. She verbalized understanding.

## 2011-08-16 NOTE — Telephone Encounter (Signed)
Thanks for the update- I will see her for her health mt exam

## 2011-08-18 ENCOUNTER — Encounter: Payer: Self-pay | Admitting: Family Medicine

## 2011-08-18 ENCOUNTER — Ambulatory Visit (INDEPENDENT_AMBULATORY_CARE_PROVIDER_SITE_OTHER): Payer: 59 | Admitting: Family Medicine

## 2011-08-18 VITALS — BP 113/46 | HR 84 | Temp 98.4°F | Ht 65.0 in | Wt 208.0 lb

## 2011-08-18 DIAGNOSIS — J069 Acute upper respiratory infection, unspecified: Secondary | ICD-10-CM | POA: Insufficient documentation

## 2011-08-18 DIAGNOSIS — J029 Acute pharyngitis, unspecified: Secondary | ICD-10-CM

## 2011-08-18 LAB — POCT RAPID STREP A (OFFICE): Rapid Strep A Screen: NEGATIVE

## 2011-08-18 MED ORDER — CLARITHROMYCIN 500 MG PO TABS: 500.0000 mg | ORAL_TABLET | Freq: Two times a day (BID) | ORAL | Status: AC

## 2011-08-18 MED ORDER — OXYCODONE-ACETAMINOPHEN 5-325 MG PO TABS: 1.0000 | ORAL_TABLET | Freq: Four times a day (QID) | ORAL | Status: DC | PRN

## 2011-08-18 MED ORDER — HYDROCOD POLST-CHLORPHEN POLST 10-8 MG/5ML PO LQCR: 5.0000 mL | Freq: Every evening | ORAL | Status: DC | PRN

## 2011-08-18 MED ORDER — CYCLOBENZAPRINE HCL 10 MG PO TABS: 10.0000 mg | ORAL_TABLET | Freq: Three times a day (TID) | ORAL | Status: DC | PRN

## 2011-08-18 MED ORDER — OMEPRAZOLE 40 MG PO CPDR: 40.0000 mg | DELAYED_RELEASE_CAPSULE | Freq: Every day | ORAL | Status: DC

## 2011-08-18 NOTE — Progress Notes (Signed)
She has f/u with vascular at [facility] pending.    H/o RA, needs refills on oxycodone, flexeril.  PMD is aware that I refilled today.    H/o GERD and needs refill on omeprazole.    Takes a 0.25 of a xanax with the MTX to help with the nausea.  This was the only thing that seemed to help with the nausea. No ADE with this.  O/w taking xanax 4-5 times a week.  In midst of divorce and house foreclosing.  She'll call psych about this.    duration of symptoms: 1 day rhinorrhea:no Congestion:yes, some post nasal gtt ear pain: R ear pain sore throat:yes Cough: yes with some green sputum Myalgias: some abd wall pain with position changes other concerns: on MTX for RA  ROS: See HPI.  Otherwise negative.    Meds, vitals, and allergies reviewed.   GEN: nad, alert and oriented, nontoxic.  HEENT: mucous membranes moist, TM w/o erythema, nasal epithelium injected, OP with cobblestoning- mild NECK: supple w/o LA CV: rrr. PULM: ctab, no inc wob ABD: soft, +bs EXT: no edema  RST neg.

## 2011-08-18 NOTE — Patient Instructions (Signed)
Hold the biaxin rx for a few days and see if this clears on its own.  Use the cough medicine in the meantime.  Take care.   Rest and fluids in the meantime.

## 2011-08-18 NOTE — Assessment & Plan Note (Signed)
ddx d/w pt. Nontoxic and early in the process.  I would hold abx in meantime and use if sx persist.  She agrees.  Okay for outpatient f/u. RST neg.   Also refilled oxycodone/APAP, flexeril and PPI today.

## 2011-08-21 ENCOUNTER — Encounter: Payer: Self-pay | Admitting: Cardiothoracic Surgery

## 2011-09-11 ENCOUNTER — Emergency Department: Payer: Self-pay | Admitting: Internal Medicine

## 2011-09-11 ENCOUNTER — Telehealth: Payer: Self-pay | Admitting: Physician Assistant

## 2011-09-11 NOTE — Telephone Encounter (Signed)
Rec'd page from ans svc that pt was going to ER. Called pt w/in 5" of receiving page, no answer, left msg.

## 2011-09-15 ENCOUNTER — Ambulatory Visit (INDEPENDENT_AMBULATORY_CARE_PROVIDER_SITE_OTHER): Payer: 59 | Admitting: Cardiovascular Disease

## 2011-09-15 ENCOUNTER — Encounter: Payer: Self-pay | Admitting: *Deleted

## 2011-09-15 ENCOUNTER — Encounter: Payer: Self-pay | Admitting: Cardiovascular Disease

## 2011-09-15 VITALS — BP 120/79 | HR 71 | Ht 65.0 in | Wt 213.0 lb

## 2011-09-15 DIAGNOSIS — Z951 Presence of aortocoronary bypass graft: Secondary | ICD-10-CM

## 2011-09-15 DIAGNOSIS — Z954 Presence of other heart-valve replacement: Secondary | ICD-10-CM

## 2011-09-15 DIAGNOSIS — R079 Chest pain, unspecified: Secondary | ICD-10-CM

## 2011-09-15 DIAGNOSIS — E785 Hyperlipidemia, unspecified: Secondary | ICD-10-CM

## 2011-09-15 DIAGNOSIS — I359 Nonrheumatic aortic valve disorder, unspecified: Secondary | ICD-10-CM

## 2011-09-15 DIAGNOSIS — I2510 Atherosclerotic heart disease of native coronary artery without angina pectoris: Secondary | ICD-10-CM

## 2011-09-15 DIAGNOSIS — I714 Abdominal aortic aneurysm, without rupture, unspecified: Secondary | ICD-10-CM

## 2011-09-15 DIAGNOSIS — R0789 Other chest pain: Secondary | ICD-10-CM | POA: Insufficient documentation

## 2011-09-15 DIAGNOSIS — I10 Essential (primary) hypertension: Secondary | ICD-10-CM

## 2011-09-15 MED ORDER — FUROSEMIDE 20 MG PO TABS: 20.0000 mg | ORAL_TABLET | Freq: Two times a day (BID) | ORAL | Status: DC

## 2011-09-15 MED ORDER — CARVEDILOL 12.5 MG PO TABS: 12.5000 mg | ORAL_TABLET | Freq: Two times a day (BID) | ORAL | Status: DC

## 2011-09-15 MED ORDER — LISINOPRIL 10 MG PO TABS: 10.0000 mg | ORAL_TABLET | Freq: Every day | ORAL | Status: DC

## 2011-09-15 NOTE — Assessment & Plan Note (Signed)
We have stressed to her the importance of weight loss and his weight has been trending upwards. She has been less active and not participating in any exercise. If her cholesterol continues to trend up, we may need to start a low-dose cholesterol pill.

## 2011-09-15 NOTE — Assessment & Plan Note (Signed)
Stable aortic dissection. She has had followup with [facility] vascular surgery with recent ultrasound studies of her upper extremities and lower extremities.

## 2011-09-15 NOTE — Assessment & Plan Note (Signed)
We will suggest repeat echocardiogram next year for evaluation of her aortic graft and valve.

## 2011-09-15 NOTE — Patient Instructions (Addendum)
You are doing well. No medication changes were made. Please start cardiac rehab again Please call us if you have new issues that need to be addressed before your next appt.  We will call you for a follow up Appt. In 6 months

## 2011-09-15 NOTE — Assessment & Plan Note (Signed)
Blood pressure is well controlled on today's visit. No changes made to the medications. 

## 2011-09-15 NOTE — Assessment & Plan Note (Signed)
Recent episode of chest pain sounds very atypical in nature. It was in the setting of coughing after an upper respiratory infection, extending over 3 days with negative workup.  No further workup is needed at this time as she is currently feeling well.

## 2011-09-15 NOTE — Assessment & Plan Note (Signed)
Currently with no symptoms of angina. No further workup at this time. Continue current medication regimen. 

## 2011-09-15 NOTE — Progress Notes (Signed)
Patient ID: Rachel Torres, female    DOB: Oct 15, 1956, 55 y.o.   MRN: [id number]  HPI Comments: [name] is a very pleasant 55 year old woman who suffered an aorta type A dissection in September 2011 who was rushed to [facility] and underwent aortic valve replacement, bypass of the right coronary artery with venous donor site from her right thigh, aortic grafting for a extensive dissection of the  ascending aorta, residual descending aortic dissection extending into the iliac artery, with chronic back pain who presents for routine followup  She reports that she had a recent episode of chest pain lasting for 3 days and was evaluated in the emergency room on September 11 2011. She had normal EKG, normal blood work including cardiac enzymes, normal chest x-ray. It was felt that her symptoms were secondary to coughing and recent upper respiratory infection and she was discharged.  Prior to this, she had a upper respiratory infection and had traveled to [place]. She was given a course of antibiotics will end up in the emergency room for a nebulizer treatment. Following the treatment, she felt better. She attributes some of her bronchospasm to traveling with a cat and dog and resolving upper respiratory infection  H/o abdominal pain, further workup showing biliary stones and sludge. s/p  laparoscopic cholecystectomy at [facility].    She takes Lasix for urinary retention. She denies any significant lower extremity edema   echocardiogram  shows moderately depressed LV systolic function estimated at 30-35%, hypokinesis of the anterior, anteroseptal wall, diastolic dysfunction, prosthetic valve is well seated with gradient of 11 mm mercury, 16 mm at peak, mild MR, mild to moderate TR with normal right ventricular systolic pressures   stress test did not show ischemia in the anterior wall. There is suggestion of mild ischemia near the lateral apex.   EKG shows normal sinus rhythm with rate 68 beats per minute,  T wave ABN noted in leads V1 to V3,  QTC 455 ms    Outpatient Encounter Prescriptions as of 09/15/2011  Medication Sig Dispense Refill  . ALPRAZolam (XANAX) 1 MG tablet 1-2tabs by mouth twice a day as needed for anxiety, careful of sedation       . aspirin 81 MG tablet Take 81 mg by mouth daily.        . carvedilol (COREG) 12.5 MG tablet Take 1 tablet (12.5 mg total) by mouth 2 (two) times daily with a meal.  180 tablet  3  . cetirizine (ZYRTEC) 10 MG tablet Take 10 mg by mouth daily.        . cyclobenzaprine (FLEXERIL) 10 MG tablet Take 1 tablet (10 mg total) by mouth 3 (three) times daily as needed.  90 tablet  2  . FLUoxetine (PROZAC) 20 MG tablet Take 20 mg by mouth 2 (two) times daily.       . folic acid (FOLVITE) 1 MG tablet Take 1 mg by mouth daily.        . furosemide (LASIX) 20 MG tablet Take 1 tablet (20 mg total) by mouth 2 (two) times daily.  180 tablet  3  . lidocaine (LIDODERM) 5 % Place 1 patch onto the skin daily. Remove & Discard patch within 12 hours or as directed by MD       . lisinopril (PRINIVIL,ZESTRIL) 10 MG tablet Take 1 tablet (10 mg total) by mouth daily.  90 tablet  3  . magnesium gluconate (MAGONATE) 500 MG tablet Take 500 mg by mouth daily.        Marland Kitchen  methotrexate (RHEUMATREX) 2.5 MG tablet Take 10 mg by mouth once a week. Caution:Chemotherapy. Protect from light.       Marland Kitchen omeprazole (PRILOSEC) 40 MG capsule Take 1 capsule (40 mg total) by mouth daily.  30 capsule  5  . oxyCODONE-acetaminophen (PERCOCET) 5-325 MG per tablet Take 1 tablet by mouth every 6 (six) hours as needed for pain.  60 tablet  0  . zolpidem (AMBIEN) 10 MG tablet Take 1 tablet (10 mg total) by mouth at bedtime as needed.  30 tablet  0    Review of Systems  HENT: Negative.   Eyes: Negative.   Cardiovascular: Positive for chest pain.  Gastrointestinal: Negative.   Musculoskeletal: Negative.   Skin: Negative.   Neurological:       Memory problems.  Hematological: Negative.     Psychiatric/Behavioral: Negative.   All other systems reviewed and are negative.   BP 120/79  Pulse 71  Ht 5\' 5"  (1.651 m)  Wt 213 lb (96.616 kg)  BMI 35.44 kg/m2   Physical Exam  Nursing note and vitals reviewed. Constitutional: She is oriented to person, place, and time. She appears well-developed and well-nourished.  HENT:  Head: Normocephalic.  Nose: Nose normal.  Mouth/Throat: Oropharynx is clear and moist.  Eyes: Conjunctivae are normal. Pupils are equal, round, and reactive to light.  Neck: Normal range of motion. Neck supple. No JVD present.  Cardiovascular: Normal rate, regular rhythm, S1 normal, S2 normal and intact distal pulses.  Exam reveals no gallop and no friction rub.   Murmur heard.  Crescendo systolic murmur is present with a grade of 2/6  Pulmonary/Chest: Effort normal and breath sounds normal. No respiratory distress. She has no wheezes. She has no rales. She exhibits no tenderness.       Well-healed mediastinal scar  Abdominal: Soft. Bowel sounds are normal. She exhibits no distension. There is no tenderness.  Musculoskeletal: Normal range of motion. She exhibits no edema and no tenderness.  Lymphadenopathy:    She has no cervical adenopathy.  Neurological: She is alert and oriented to person, place, and time. Coordination normal.  Skin: Skin is warm and dry. No rash noted. No erythema.  Psychiatric: She has a normal mood and affect. Her behavior is normal. Judgment and thought content normal.         Assessment and Plan

## 2011-09-16 ENCOUNTER — Encounter: Payer: Self-pay | Admitting: *Deleted

## 2011-09-17 LAB — BASIC METABOLIC PANEL
BUN: 10
CO2: 25
Calcium: 8.9
Chloride: 105
Creatinine, Ser: 0.71
GFR calc Af Amer: >60
GFR calc non Af Amer: >60
Glucose, Bld: 94
Potassium: 3.4 — ABNORMAL LOW
Sodium: 138

## 2011-09-17 LAB — CBC
HCT: 39.2
Hemoglobin: 13.5
MCHC: 34.5
MCV: 92.7
Platelets: 280
RBC: 4.23
RDW: 13.7
WBC: 10.4

## 2011-09-17 LAB — PROTIME-INR
INR: 1
Prothrombin Time: 13.7

## 2011-09-17 LAB — APTT: aPTT: 32

## 2011-09-20 ENCOUNTER — Ambulatory Visit (INDEPENDENT_AMBULATORY_CARE_PROVIDER_SITE_OTHER): Payer: 59 | Admitting: Family Medicine

## 2011-09-20 ENCOUNTER — Encounter: Payer: Self-pay | Admitting: Cardiothoracic Surgery

## 2011-09-20 ENCOUNTER — Encounter: Payer: Self-pay | Admitting: Family Medicine

## 2011-09-20 ENCOUNTER — Other Ambulatory Visit (HOSPITAL_COMMUNITY)
Admission: RE | Admit: 2011-09-20 | Discharge: 2011-09-20 | Disposition: A | Discharge status: Home / Self Care | Payer: 59 | Source: Ambulatory Visit | Attending: Family Medicine | Admitting: Family Medicine

## 2011-09-20 VITALS — BP 110/60 | HR 80 | Temp 98.1°F | Ht 65.25 in | Wt 210.0 lb

## 2011-09-20 DIAGNOSIS — M810 Age-related osteoporosis without current pathological fracture: Secondary | ICD-10-CM

## 2011-09-20 DIAGNOSIS — Z01419 Encounter for gynecological examination (general) (routine) without abnormal findings: Secondary | ICD-10-CM | POA: Insufficient documentation

## 2011-09-20 DIAGNOSIS — I10 Essential (primary) hypertension: Secondary | ICD-10-CM

## 2011-09-20 DIAGNOSIS — Z23 Encounter for immunization: Secondary | ICD-10-CM

## 2011-09-20 DIAGNOSIS — Z0000 Encounter for general adult medical examination without abnormal findings: Secondary | ICD-10-CM | POA: Insufficient documentation

## 2011-09-20 DIAGNOSIS — Z1159 Encounter for screening for other viral diseases: Secondary | ICD-10-CM | POA: Insufficient documentation

## 2011-09-20 DIAGNOSIS — E785 Hyperlipidemia, unspecified: Secondary | ICD-10-CM

## 2011-09-20 NOTE — Patient Instructions (Addendum)
Pap today  We will schedule fasting labs for future at check out Try to get 1200-1500 mg of calcium per day with at least 1000 iu of vitamin D - for bone health  Flu shot today  Keep working on diet and exercise for weight loss Follow up in about 6 months  Ask your rheumatologist about zoster vaccine

## 2011-09-20 NOTE — Assessment & Plan Note (Signed)
Need to start back with ca and D  Re check dexa in a year Disc exercise Has had a f

## 2011-09-20 NOTE — Assessment & Plan Note (Addendum)
Fasting labs planned when she can come back- (in a hurry and cannot stay for lab today) Rev low sat fat diet Rev goals for cholesterol control

## 2011-09-20 NOTE — Assessment & Plan Note (Signed)
bp in very good control currently  Doing well  Will start back with cardiac rehab

## 2011-09-20 NOTE — Progress Notes (Signed)
Subjective:    Patient ID: Rachel Torres, female    DOB: October 16, 1956, 55 y.o.   MRN: [id number]  HPI Here for annual health mt exam  Has been doing ok  Still has pain in the back and sometimes in the chest  Still trying to figure out why  Has some lipomas - and that may be adding to it - and high surgical risk   HTN 110/60 good control   Wt is down 3 lb with bmi of 34  Working on that steadily -- hovers right there  Is eating right  Is hard to exercise  Supposed to start water aerobics too   Pap- last one was right before she got -- 08? , no cervical pathology in past   Colon screen-- had colonoscopy 2 years ago - all fine , 5 year follow up - fam hx is strong   gerd - controlled by ppi   Balance is terrible - and is interested in PT for that in the future  Long term - problem and does not know why   Flu shot- is ok with today  Is interested in shingles shot - will have to ask rheumatologist   Pneumovax good till next year  Mam 3/12 normal  Self exam   Td 1/10  Lipids due - along with rest of lipid labs   MTx is helping RA   OP with hx of rad fx -- was after fx wrist -- 1-2 years ago  Not taking ca and D  Back pain   Depression- seeing counselor today   Patient Active Problem List  Diagnoses  . HYPERLIPIDEMIA  . ANXIETY  . PERSISTENT DISORDER INITIATING/MAINTAINING SLEEP  . DEPRESSION  . HYPERTENSION  . CORONARY ARTERY SPASM  . DYSRHYTHMIA, CARDIAC NOS  . EXTERNAL HEMORRHOIDS, WITH COMPLICATION  . ALLERGIC RHINITIS  . ASTHMA  . GERD  . IRRITABLE BOWEL SYNDROME  . OSTEOARTHRITIS  . OSTEOPOROSIS  . INSOMNIA  . SLEEP APNEA  . MURMUR  . MIGRAINES, HX OF  . POSTMENOPAUSAL STATUS  . AAA  . GALLBLADDER DISEASE  . BACK PAIN  . FATIGUE  . AORTIC VALVE REPLACEMENT, HX OF  . CORONARY ARTERY BYPASS GRAFT, HX OF  . UNSPECIFIED ANEMIA  . DIZZINESS  . CARDIOMYOPATHY  . URINARY RETENTION  . Bronchitis  . URI (upper respiratory infection)  . Chest pain   . Routine general medical examination at a health care facility  . Gynecological examination   Past Medical History  Diagnosis Date  . Allergy   . Asthma   . Anxiety   . Depression   . GERD (gastroesophageal reflux disease)   . Hyperlipidemia   . Hypertension   . Osteoporosis   . Gallbladder sludge   . AAA (abdominal aortic aneurysm)     with rupture and surgery  . Marfan syndrome   . RA (rheumatoid arthritis)     per Dr. Mallie Mussel   Past Surgical History  Procedure Date  . Cesarean section 1986 & 1991  . Cardiac catheterization 2001  . Fracture surgery     shoulder replacement  . Orif distal radius fracture   . Abdominal aortic aneurysm repair    History  Substance Use Topics  . Smoking status: Former Games developer  . Smokeless tobacco: Never Used  . Alcohol Use: Yes   Family History  Problem Relation Age of Onset  . Hypertension Mother   . Depression Mother   . Osteoporosis Mother   . Stroke Mother   .  Arthritis Sister   . Aneurysm Paternal Grandmother   . Nephrolithiasis Sister   . Osteoporosis Sister    Allergies  Allergen Reactions  . Alendronate Sodium     REACTION: GI  . Aspirin     REACTION: bruising  . Hydrocodone-Acetaminophen     REACTION: itching  . Peanut-Containing Drug Products     REACTION: migraine  . Prednisone     REACTION: rash  . Tussionex Pennkinetic Er Itching   Current Outpatient Prescriptions on File Prior to Visit  Medication Sig Dispense Refill  . ALPRAZolam (XANAX) 1 MG tablet 1-2tabs by mouth twice a day as needed for anxiety, careful of sedation       . aspirin 81 MG tablet Take 81 mg by mouth daily.        . carvedilol (COREG) 12.5 MG tablet Take 1 tablet (12.5 mg total) by mouth 2 (two) times daily with a meal.  180 tablet  3  . cetirizine (ZYRTEC) 10 MG tablet Take 10 mg by mouth daily.        . cyclobenzaprine (FLEXERIL) 10 MG tablet Take 1 tablet (10 mg total) by mouth 3 (three) times daily as needed.  90 tablet  2  .  FLUoxetine (PROZAC) 20 MG tablet Take 20 mg by mouth daily.       . folic acid (FOLVITE) 1 MG tablet Take 1 mg by mouth daily.        . furosemide (LASIX) 20 MG tablet Take 1 tablet (20 mg total) by mouth 2 (two) times daily.  180 tablet  3  . lidocaine (LIDODERM) 5 % Place 1 patch onto the skin daily. Remove & Discard patch within 12 hours or as directed by MD       . lisinopril (PRINIVIL,ZESTRIL) 10 MG tablet Take 1 tablet (10 mg total) by mouth daily.  90 tablet  3  . methotrexate (RHEUMATREX) 2.5 MG tablet Take 10 mg by mouth once a week. Caution:Chemotherapy. Protect from light.       Marland Kitchen omeprazole (PRILOSEC) 40 MG capsule Take 1 capsule (40 mg total) by mouth daily.  30 capsule  5  . oxyCODONE-acetaminophen (PERCOCET) 5-325 MG per tablet Take 1 tablet by mouth every 6 (six) hours as needed for pain.  60 tablet  0  . zolpidem (AMBIEN) 10 MG tablet Take 1 tablet (10 mg total) by mouth at bedtime as needed.  30 tablet  0  . magnesium gluconate (MAGONATE) 500 MG tablet Take 500 mg by mouth daily.             Review of Systems Review of Systems  Constitutional: Negative for fever, appetite change, fatigue and unexpected weight change.  Eyes: Negative for pain and visual disturbance.  Respiratory: Negative for cough and shortness of breath.   Cardiovascular: Negative for cp or palpitations    Gastrointestinal: Negative for nausea, diarrhea and constipation. some chronic cp after her AAA surgery Genitourinary: Negative for urgency and frequency.  Skin: Negative for pallor or rash   MSK pos for chronic joint pain  Neurological: Negative for weakness, light-headedness, numbness and headaches.  Hematological: Negative for adenopathy. Does not bruise/bleed easily.  Psychiatric/Behavioral: pos for depression that is well controlled  The patient is not nervous/anxious.         Objective:   Physical Exam  Constitutional: She appears well-developed and well-nourished. No distress.       overwt  and well appearing   HENT:  Head: Normocephalic and atraumatic.  Right Ear: External ear normal.  Left Ear: External ear normal.  Nose: Nose normal.  Mouth/Throat: Oropharynx is clear and moist.  Eyes: Conjunctivae and EOM are normal. Pupils are equal, round, and reactive to light.  Neck: Normal range of motion. Neck supple. No JVD present. Carotid bruit is not present. Erythema present. No thyromegaly present.  Cardiovascular: Normal rate, regular rhythm, normal heart sounds and intact distal pulses.  Exam reveals no gallop.   Pulmonary/Chest: Effort normal and breath sounds normal. No respiratory distress. She has no wheezes. She exhibits no tenderness.  Abdominal: Soft. Bowel sounds are normal. She exhibits no distension, no abdominal bruit, no pulsatile midline mass and no mass. There is no tenderness.  Genitourinary: Vagina normal and uterus normal. No breast swelling, tenderness, discharge or bleeding. No vaginal discharge found.  Musculoskeletal: She exhibits tenderness. She exhibits no edema.       Some LS tenderness ,limited rom No kyphosis   Lymphadenopathy:    She has no cervical adenopathy.  Neurological: She is alert. She has normal reflexes. No cranial nerve deficit. She exhibits normal muscle tone. Coordination normal.  Skin: Skin is warm and dry. No rash noted. No erythema. No pallor.  Psychiatric: She has a normal mood and affect.          Assessment & Plan:

## 2011-09-21 ENCOUNTER — Other Ambulatory Visit: Payer: 59

## 2011-09-23 NOTE — Assessment & Plan Note (Signed)
Exam with pap done today - no c/o or problems

## 2011-09-23 NOTE — Assessment & Plan Note (Signed)
Reviewed health habits including diet and exercise and skin cancer prevention Also reviewed health mt list, fam hx and immunizations  Planned return visit for wellness labs Disc immunizations- flu shot

## 2011-09-24 ENCOUNTER — Encounter: Payer: Self-pay | Admitting: *Deleted

## 2011-09-27 ENCOUNTER — Encounter: Payer: Self-pay | Admitting: *Deleted

## 2011-10-04 ENCOUNTER — Ambulatory Visit (INDEPENDENT_AMBULATORY_CARE_PROVIDER_SITE_OTHER): Payer: 59 | Admitting: Family Medicine

## 2011-10-04 ENCOUNTER — Encounter: Payer: Self-pay | Admitting: Family Medicine

## 2011-10-04 VITALS — BP 122/70 | HR 80 | Temp 98.2°F | Wt 211.2 lb

## 2011-10-04 DIAGNOSIS — J40 Bronchitis, not specified as acute or chronic: Secondary | ICD-10-CM | POA: Insufficient documentation

## 2011-10-04 MED ORDER — BENZONATATE 100 MG PO CAPS: 100.0000 mg | ORAL_CAPSULE | Freq: Three times a day (TID) | ORAL | Status: DC | PRN

## 2011-10-04 NOTE — Patient Instructions (Signed)
I think you do have bronchitis.  Likely viral process. Update if fever >101, worsening cough instead of improving, shortness of breath, or other concerns. Take tessalon perls and oxycodone for cough suppressant. Push fluids and plenty of rest.   Call us with questions.

## 2011-10-04 NOTE — Progress Notes (Signed)
  Subjective:    Patient ID: Rachel Torres, female    DOB: 04/26/1956, 55 y.o.   MRN: [id number]  HPI CC: cough  5d h/o drainage down throat, cough recently started.  Has been coughing since then.  Mildly productive of yellow/green sputum.  This morning had episode where got dizzy while sitting at computer.  Some abd and chest pain from coughing.  No residual chest pain.  Mild diarrhea this am as well.  Taking refenesen DM and albuterol and tessalon perls which help.  No fevers/chills, ST, HA, ear pain or tooth pain.  No new rashes.  No myalgia/arthralgia.  No smokers at home.  No h/o COPD.  Considered asthmatic.  No sick contacts at home.  H/o thoracic aortic rupture and dissection to AAA 08/2010 now with prosthetic valve.   Recently completed course of biaxin for URTI.  Review of Systems Per HPI    Objective:   Physical Exam  Nursing note and vitals reviewed. Constitutional: She appears well-developed and well-nourished. No distress.  HENT:  Head: Normocephalic and atraumatic.  Right Ear: Hearing, tympanic membrane, external ear and ear canal normal.  Left Ear: Hearing, tympanic membrane, external ear and ear canal normal.  Nose: Nose normal. No mucosal edema, rhinorrhea or nose lacerations. Right sinus exhibits no maxillary sinus tenderness and no frontal sinus tenderness. Left sinus exhibits no maxillary sinus tenderness and no frontal sinus tenderness.  Mouth/Throat: Uvula is midline and oropharynx is clear and moist. No oropharyngeal exudate, posterior oropharyngeal edema, posterior oropharyngeal erythema or tonsillar abscesses.  Eyes: Conjunctivae and EOM are normal. Pupils are equal, round, and reactive to light. No scleral icterus.  Neck: Normal range of motion. Neck supple.  Cardiovascular: Normal rate, regular rhythm and intact distal pulses.   Murmur (SEM) heard. Pulmonary/Chest: Effort normal and breath sounds normal. No respiratory distress. She has no decreased breath  sounds. She has no wheezes. She has no rales.       cough  Musculoskeletal: She exhibits no edema.  Lymphadenopathy:    She has no cervical adenopathy.  Skin: Skin is warm and dry. No rash noted.  Psychiatric: She has a normal mood and affect.          Assessment & Plan:  ing

## 2011-10-04 NOTE — Assessment & Plan Note (Signed)
Anticipate viral process. Mainly want cough suppression, but unable to take tussionex or other hydrocodone.  Treat with 1/2 oxycodones and tessalon perls. Update [place] if not improving as expected for course of antibiotics.

## 2011-10-13 ENCOUNTER — Ambulatory Visit (INDEPENDENT_AMBULATORY_CARE_PROVIDER_SITE_OTHER): Payer: 59 | Admitting: Family Medicine

## 2011-10-13 ENCOUNTER — Encounter: Payer: Self-pay | Admitting: Family Medicine

## 2011-10-13 VITALS — BP 114/72 | HR 74 | Temp 97.9°F | Wt 213.8 lb

## 2011-10-13 DIAGNOSIS — J40 Bronchitis, not specified as acute or chronic: Secondary | ICD-10-CM

## 2011-10-13 DIAGNOSIS — M810 Age-related osteoporosis without current pathological fracture: Secondary | ICD-10-CM

## 2011-10-13 DIAGNOSIS — Z0000 Encounter for general adult medical examination without abnormal findings: Secondary | ICD-10-CM

## 2011-10-13 LAB — TSH: TSH: 0.67 u[IU]/mL (ref 0.35–5.50)

## 2011-10-13 LAB — CBC WITH DIFFERENTIAL/PLATELET
Basophils Absolute: 0 10*3/uL (ref 0.0–0.1)
Basophils Relative: 0.5 % (ref 0.0–3.0)
Eosinophils Absolute: 0.2 10*3/uL (ref 0.0–0.7)
Eosinophils Relative: 3.5 % (ref 0.0–5.0)
HCT: 34.2 % — ABNORMAL LOW (ref 36.0–46.0)
Hemoglobin: 11.7 g/dL — ABNORMAL LOW (ref 12.0–15.0)
Lymphocytes Relative: 37.1 % (ref 12.0–46.0)
Lymphs Abs: 1.9 10*3/uL (ref 0.7–4.0)
MCHC: 34.1 g/dL (ref 30.0–36.0)
MCV: 99.5 fl (ref 78.0–100.0)
Monocytes Absolute: 0.4 10*3/uL (ref 0.1–1.0)
Monocytes Relative: 8.4 % (ref 3.0–12.0)
Neutro Abs: 2.7 10*3/uL (ref 1.4–7.7)
Neutrophils Relative %: 50.5 % (ref 43.0–77.0)
Platelets: 262 10*3/uL (ref 150.0–400.0)
RBC: 3.43 Mil/uL — ABNORMAL LOW (ref 3.87–5.11)
RDW: 16 % — ABNORMAL HIGH (ref 11.5–14.6)
WBC: 5.3 10*3/uL (ref 4.5–10.5)

## 2011-10-13 LAB — COMPREHENSIVE METABOLIC PANEL
ALT: 21 U/L (ref 0–35)
AST: 21 U/L (ref 0–37)
Albumin: 3.9 g/dL (ref 3.5–5.2)
Alkaline Phosphatase: 55 U/L (ref 39–117)
BUN: 11 mg/dL (ref 6–23)
CO2: 27 mEq/L (ref 19–32)
Calcium: 8.8 mg/dL (ref 8.4–10.5)
Chloride: 107 mEq/L (ref 96–112)
Creatinine, Ser: 0.9 mg/dL (ref 0.4–1.2)
GFR: 71.17 mL/min (ref >60.00)
Glucose, Bld: 95 mg/dL (ref 70–99)
Potassium: 4 mEq/L (ref 3.5–5.1)
Sodium: 141 mEq/L (ref 135–145)
Total Bilirubin: 0.7 mg/dL (ref 0.3–1.2)
Total Protein: 7 g/dL (ref 6.0–8.3)

## 2011-10-13 LAB — LIPID PANEL
Cholesterol: 262 mg/dL — ABNORMAL HIGH (ref 0–200)
HDL: 46.6 mg/dL (ref >39.00)
Total CHOL/HDL Ratio: 6
Triglycerides: 143 mg/dL (ref 0.0–149.0)
VLDL: 28.6 mg/dL (ref 0.0–40.0)

## 2011-10-13 LAB — LDL CHOLESTEROL, DIRECT: Direct LDL: 189.4 mg/dL

## 2011-10-13 MED ORDER — CLARITHROMYCIN 500 MG PO TABS: 500.0000 mg | ORAL_TABLET | Freq: Two times a day (BID) | ORAL | Status: AC

## 2011-10-13 MED ORDER — POCKET SPACER DEVI: Status: DC

## 2011-10-13 MED ORDER — ALBUTEROL SULFATE HFA 108 (90 BASE) MCG/ACT IN AERS: 2.0000 | INHALATION_SPRAY | RESPIRATORY_TRACT | Status: DC | PRN

## 2011-10-13 NOTE — Patient Instructions (Signed)
I think you have worsenig bronchitis, given going on past 2 weeks and worse cough, take biaxin for 10 days.  May use albuterol with spacer - sent in. Continue tessalon perls. Watch for fever >101.5, or worsening chest pain or shortness of breath. I hope you start feeing better! Get plenty of rest.

## 2011-10-13 NOTE — Progress Notes (Signed)
Addended by: Alvina Chou on: 10/13/2011 10:54 AM   Modules accepted: Orders

## 2011-10-13 NOTE — Progress Notes (Signed)
  Subjective:    Patient ID: Rachel Torres, female    DOB: 16-Jul-1956, 55 y.o.   MRN: [id number]  HPI CC: cough  Seen 10/04/2011 with anticipated viral bronchitis.  Started on tessalon perls (didn't really help), also recommended start home oxycodone for cough suppression.  Also using inhaler, not really helping.  Cough is actually worse now.  In am productive, also throughout day.  Now going on 2+ wks.  + chest pain with coughing but not otherwise, when gets coughing fits, starts having HA.  No post tussive emesis, no diaphoresis.  No fevers/chills, ST, HA, ear pain or tooth pain. No new rashes. No myalgia/arthralgia.  No smokers at home. No h/o COPD. Considered asthmatic. No sick contacts at home.  H/o thoracic aortic rupture and dissection to AAA 08/2010 now with prosthetic valve.   Last on biaxin 08/2011.  Review of Systems Per HPI    Objective:   Physical Exam  Nursing note and vitals reviewed. Constitutional: She appears well-developed and well-nourished. No distress.  HENT:  Head: Normocephalic and atraumatic.  Right Ear: Hearing, tympanic membrane, external ear and ear canal normal.  Left Ear: Hearing, tympanic membrane, external ear and ear canal normal.  Nose: Nose normal. No mucosal edema, rhinorrhea or nose lacerations. Right sinus exhibits no maxillary sinus tenderness and no frontal sinus tenderness. Left sinus exhibits no maxillary sinus tenderness and no frontal sinus tenderness.  Mouth/Throat: Uvula is midline and oropharynx is clear and moist. No oropharyngeal exudate, posterior oropharyngeal edema, posterior oropharyngeal erythema or tonsillar abscesses.  Eyes: Conjunctivae and EOM are normal. Pupils are equal, round, and reactive to light. No scleral icterus.  Neck: Normal range of motion. Neck supple.  Cardiovascular: Normal rate, regular rhythm and intact distal pulses.   Murmur (SEM) heard.      Midline chest scar  Pulmonary/Chest: Effort normal and breath sounds  normal. No respiratory distress. She has no decreased breath sounds. She has no wheezes. She has no rales.       Evidently congested and coughing  Musculoskeletal: She exhibits no edema.  Lymphadenopathy:    She has no cervical adenopathy.  Skin: Skin is warm and dry. No rash noted.  Psychiatric: She has a normal mood and affect.          Assessment & Plan:

## 2011-10-13 NOTE — Assessment & Plan Note (Signed)
Going on 2 wks, initially thought viral, actually worsening. Lungs clear today. Treat with biaxin.  Continue tssalon, albuterol, sent in spacer. Update if red flags.

## 2011-10-14 LAB — VITAMIN D 25 HYDROXY (VIT D DEFICIENCY, FRACTURES): Vit D, 25-Hydroxy: 28 ng/mL — ABNORMAL LOW (ref 30–89)

## 2011-10-19 ENCOUNTER — Ambulatory Visit (INDEPENDENT_AMBULATORY_CARE_PROVIDER_SITE_OTHER): Payer: 59 | Admitting: Family Medicine

## 2011-10-19 ENCOUNTER — Encounter: Payer: Self-pay | Admitting: Family Medicine

## 2011-10-19 VITALS — BP 106/66 | HR 68 | Temp 98.1°F | Ht 65.25 in | Wt 214.2 lb

## 2011-10-19 DIAGNOSIS — D649 Anemia, unspecified: Secondary | ICD-10-CM

## 2011-10-19 DIAGNOSIS — E559 Vitamin D deficiency, unspecified: Secondary | ICD-10-CM | POA: Insufficient documentation

## 2011-10-19 DIAGNOSIS — I10 Essential (primary) hypertension: Secondary | ICD-10-CM

## 2011-10-19 DIAGNOSIS — E785 Hyperlipidemia, unspecified: Secondary | ICD-10-CM

## 2011-10-19 DIAGNOSIS — J40 Bronchitis, not specified as acute or chronic: Secondary | ICD-10-CM

## 2011-10-19 NOTE — Progress Notes (Signed)
Subjective:    Patient ID: Rachel Torres, female    DOB: 1956-10-19, 55 y.o.   MRN: [id number]  HPI Here for f/u of HTN and lipids and vit D def (new) and anemia   Still has bronchitis  Called her rheum - and told to hold methotrextate Coughing -- no fever  Feeling lousy in general   Wt kept creeping up  Is painful to walk - with her chronic back pain  Exercise bike is an option   HTN good control 106/66 No ha or palpitations  Cholesterol Lab Results  Component Value Date   CHOL 262* 10/13/2011   CHOL 188 12/11/2010   CHOL 224* 05/05/2009   Lab Results  Component Value Date   HDL 46.60 10/13/2011   HDL 39.40 12/11/2010   HDL 38.60* 05/05/2009   Lab Results  Component Value Date   LDLCALC 127* 12/11/2010   Lab Results  Component Value Date   TRIG 143.0 10/13/2011   TRIG 108.0 12/11/2010   TRIG 146.0 05/05/2009   Lab Results  Component Value Date   CHOLHDL 6 10/13/2011   CHOLHDL 5 12/11/2010   CHOLHDL 6 05/05/2009   Lab Results  Component Value Date   LDLDIRECT 189.4 10/13/2011   LDLDIRECT 163.3 05/05/2009   LDLDIRECT 146.4 01/09/2009   LDL calc good , LDL direct is high Diet-- is not optimal for that -- lives with her mom and she does not eat low chol  Has never been on chol med before   Vit D is 28-- stopped taking her vit D Will get back on it  Does have aches and pains   HB is down a bit at 11.7 Not taking mvi with iron any more  Has been anemic on and off sine her AAA surgery Does get tired easily   Patient Active Problem List  Diagnoses  . HYPERLIPIDEMIA  . ANXIETY  . PERSISTENT DISORDER INITIATING/MAINTAINING SLEEP  . DEPRESSION  . HYPERTENSION  . CORONARY ARTERY SPASM  . DYSRHYTHMIA, CARDIAC NOS  . EXTERNAL HEMORRHOIDS, WITH COMPLICATION  . ALLERGIC RHINITIS  . ASTHMA  . GERD  . IRRITABLE BOWEL SYNDROME  . OSTEOARTHRITIS  . OSTEOPOROSIS  . INSOMNIA  . SLEEP APNEA  . MURMUR  . MIGRAINES, HX OF  . POSTMENOPAUSAL STATUS  . AAA  .  GALLBLADDER DISEASE  . BACK PAIN  . AORTIC VALVE REPLACEMENT, HX OF  . CORONARY ARTERY BYPASS GRAFT, HX OF  . UNSPECIFIED ANEMIA  . DIZZINESS  . CARDIOMYOPATHY  . Chest pain  . Routine general medical examination at a health care facility  . Gynecological examination  . Bronchitis  . Vitamin D deficiency   Past Medical History  Diagnosis Date  . Allergy   . Asthma   . Anxiety   . Depression   . GERD (gastroesophageal reflux disease)   . Hyperlipidemia   . Hypertension   . Osteoporosis   . Gallbladder sludge   . AAA (abdominal aortic aneurysm)     with rupture and surgery  . Marfan syndrome   . RA (rheumatoid arthritis)     per Dr. Mallie Mussel   Past Surgical History  Procedure Date  . Cesarean section 1986 & 1991  . Cardiac catheterization 2001  . Fracture surgery     shoulder replacement  . Orif distal radius fracture   . Abdominal aortic aneurysm repair    History  Substance Use Topics  . Smoking status: Former Games developer  . Smokeless tobacco: Never Used  .  Alcohol Use: Yes   Family History  Problem Relation Age of Onset  . Hypertension Mother   . Depression Mother   . Osteoporosis Mother   . Stroke Mother   . Arthritis Sister   . Aneurysm Paternal Grandmother   . Nephrolithiasis Sister   . Osteoporosis Sister    Allergies  Allergen Reactions  . Alendronate Sodium     REACTION: GI  . Aspirin     REACTION: bruising  . Hydrocodone-Acetaminophen     REACTION: itching  . Peanut-Containing Drug Products     REACTION: migraine  . Prednisone     REACTION: rash  . Tussionex Pennkinetic Er Itching   Current Outpatient Prescriptions on File Prior to Visit  Medication Sig Dispense Refill  . albuterol (PROVENTIL HFA;VENTOLIN HFA) 108 (90 BASE) MCG/ACT inhaler Inhale 2 puffs into the lungs every 4 (four) hours as needed.  1 Inhaler  3  . ALPRAZolam (XANAX) 1 MG tablet 1-2tabs by mouth twice a day as needed for anxiety, careful of sedation       . aspirin 81 MG  tablet Take 81 mg by mouth daily.        . benzonatate (TESSALON) 100 MG capsule Take 1 capsule (100 mg total) by mouth 3 (three) times daily as needed for cough.  45 capsule  0  . carvedilol (COREG) 12.5 MG tablet Take 1 tablet (12.5 mg total) by mouth 2 (two) times daily with a meal.  180 tablet  3  . cetirizine (ZYRTEC) 10 MG tablet Take 10 mg by mouth daily.        . clarithromycin (BIAXIN) 500 MG tablet Take 1 tablet (500 mg total) by mouth 2 (two) times daily.  20 tablet  0  . cyclobenzaprine (FLEXERIL) 10 MG tablet Take 1 tablet (10 mg total) by mouth 3 (three) times daily as needed.  90 tablet  2  . fexofenadine (ALLEGRA) 180 MG tablet Take 180 mg by mouth daily.        Marland Kitchen FLUoxetine (PROZAC) 20 MG tablet Take 20 mg by mouth daily.       . folic acid (FOLVITE) 1 MG tablet Take 1 mg by mouth daily.        . furosemide (LASIX) 20 MG tablet Take 1 tablet (20 mg total) by mouth 2 (two) times daily.  180 tablet  3  . lidocaine (LIDODERM) 5 % Place 1 patch onto the skin daily. Remove & Discard patch within 12 hours or as directed by MD       . lisinopril (PRINIVIL,ZESTRIL) 10 MG tablet Take 1 tablet (10 mg total) by mouth daily.  90 tablet  3  . omeprazole (PRILOSEC) 40 MG capsule Take 1 capsule (40 mg total) by mouth daily.  30 capsule  5  . oxyCODONE-acetaminophen (PERCOCET) 5-325 MG per tablet Take 1 tablet by mouth every 6 (six) hours as needed for pain.  60 tablet  0  . Spacer/Aero-Holding Chambers (POCKET SPACER) DEVI Use with albuterol  1 each  0  . Dextromethorphan-Guaifenesin (REFENESEN DM PO) Take 1 tablet by mouth 3 (three) times daily as needed.        . magnesium gluconate (MAGONATE) 500 MG tablet Take 500 mg by mouth daily.        . methotrexate (RHEUMATREX) 2.5 MG tablet Take 10 mg by mouth once a week. Caution:Chemotherapy. Protect from light.       . zolpidem (AMBIEN) 10 MG tablet Take 1 tablet (10 mg total) by  mouth at bedtime as needed.  30 tablet  0       Review of  Systems Review of Systems  Constitutional: Negative for fever, appetite change, fatigue and unexpected weight change.  Eyes: Negative for pain and visual disturbance.  Respiratory: pos for persistant cough- no longer productive, no wheeze or sob   Cardiovascular: Negative for cp or palpitations    Gastrointestinal: Negative for nausea, diarrhea and constipation.  Genitourinary: Negative for urgency and frequency.  Skin: Negative for pallor or rash   \MSK pos for severe back pain, no acute joint changes  Neurological: Negative for weakness, light-headedness, numbness and headaches.  Hematological: Negative for adenopathy. Does not bruise/bleed easily.  Psychiatric/Behavioral: Negative for dysphoric mood. The patient is not nervous/anxious.          Objective:   Physical Exam  Constitutional: She appears well-developed and well-nourished. No distress.       overwt and well appearing   HENT:  Head: Normocephalic and atraumatic.  Right Ear: External ear normal.  Left Ear: External ear normal.  Nose: Nose normal.  Mouth/Throat: Oropharynx is clear and moist.       Some post nasal drip  Eyes: Conjunctivae and EOM are normal. Pupils are equal, round, and reactive to light. No scleral icterus.  Neck: Normal range of motion. Neck supple. No JVD present. Carotid bruit is not present. No thyromegaly present.  Cardiovascular: Normal rate, regular rhythm and intact distal pulses.  Exam reveals no gallop.   Murmur heard. Pulmonary/Chest: Effort normal and breath sounds normal. No respiratory distress. She has no wheezes. She has no rales. She exhibits no tenderness.  Abdominal: Soft. Bowel sounds are normal. She exhibits no distension, no abdominal bruit and no mass. There is no tenderness.  Musculoskeletal: Normal range of motion. She exhibits tenderness. She exhibits no edema.       Baseline LS tenderness   No kyphosis   Lymphadenopathy:    She has no cervical adenopathy.  Neurological:  She is alert. She has normal reflexes. No cranial nerve deficit. She exhibits normal muscle tone. Coordination normal.  Skin: Skin is warm and dry. No rash noted. No erythema. No pallor.  Psychiatric: She has a normal mood and affect.          Assessment & Plan:

## 2011-10-19 NOTE — Patient Instructions (Signed)
Avoid red meat/ fried foods/ egg yolks/ fatty breakfast meats/ butter, cheese and high fat dairy/ and shellfish   Take 4000 iu of vitamin d daily over the counter (D3)  Also start back on multivitamin with iron  Let me know if fever or worse cough  Follow up in 3 months with labs prior

## 2011-10-21 NOTE — Assessment & Plan Note (Signed)
This waxes and wanes since her surgeries Hb down very slt Enc to start mvi with iron Will re check 3 mo and f/u

## 2011-10-21 NOTE — Assessment & Plan Note (Signed)
Slowly improving with residual cough -- now off methotrexate until fully better per her rheum Adv to contact us if worse / sob/fever

## 2011-10-21 NOTE — Assessment & Plan Note (Signed)
Not at goal Disc goals for lipids and reasons to control them Rev labs with pt Rev low sat fat diet in detail  Re check 3 mo after better diet

## 2011-10-21 NOTE — Assessment & Plan Note (Addendum)
D is low Disc imp of this to bone and overall health Will start supplementation- 4000 iu daily Re check at f/u

## 2011-10-21 NOTE — Assessment & Plan Note (Signed)
Stable bp - will continue to follow carefully No change in meds Enc to start exercise- bike or chair or swimming- when able and work on wt loss

## 2011-11-19 ENCOUNTER — Ambulatory Visit (INDEPENDENT_AMBULATORY_CARE_PROVIDER_SITE_OTHER): Payer: 59 | Admitting: Family Medicine

## 2011-11-19 ENCOUNTER — Encounter: Payer: Self-pay | Admitting: Family Medicine

## 2011-11-19 VITALS — BP 118/74 | HR 84 | Temp 98.4°F | Resp 20 | Ht 65.25 in | Wt 212.2 lb

## 2011-11-19 DIAGNOSIS — I10 Essential (primary) hypertension: Secondary | ICD-10-CM

## 2011-11-19 DIAGNOSIS — F3289 Other specified depressive episodes: Secondary | ICD-10-CM

## 2011-11-19 DIAGNOSIS — F329 Major depressive disorder, single episode, unspecified: Secondary | ICD-10-CM

## 2011-11-19 DIAGNOSIS — E669 Obesity, unspecified: Secondary | ICD-10-CM

## 2011-11-19 DIAGNOSIS — E785 Hyperlipidemia, unspecified: Secondary | ICD-10-CM

## 2011-11-19 DIAGNOSIS — E559 Vitamin D deficiency, unspecified: Secondary | ICD-10-CM

## 2011-11-19 MED ORDER — TRAMADOL HCL 50 MG PO TABS: ORAL_TABLET | ORAL | Status: DC

## 2011-11-19 NOTE — Patient Instructions (Addendum)
You need to exercise at least 5 days per week for 30 minutes minumum  Aim for 1200-1500 calories per day or join weight watchers or [employer] vitamin D 4000 iu daily  For cholesterol -Avoid red meat/ fried foods/ egg yolks/ fatty breakfast meats/ butter, cheese and high fat dairy/ and shellfish  (re checking in December)  I think your bronchitis is viral - treat symptomatically /drink fluids and let me know if worse cough/ fever/ shortness of breath - use inhaler if needed to keep airways open  Stop percocet and try ultram instead for pain - I sent it to your pharmacy

## 2011-11-19 NOTE — Progress Notes (Signed)
Subjective:    Patient ID: Rachel Torres, female    DOB: Nov 20, 1956, 55 y.o.   MRN: [id number]  HPI Here for f/u for multiple problems incl wt gain/ cough/ vit D def/ cholesterol  Bronchitis got better and then caught something new  Still off the methotrexate  Now will start leflunomide   Was better for 2 weeks  Now symptoms again since Tuesday  Started in chest - not head  Coughing up some colored phlegm  Some wheezing and tightness No fever  L ear has been a bit bothersome No throat pain  Using nasal saline- which helps   HTN ok with 118/70 today No ha or cp or palpitations  Wt is up 16 lb since march  Down 2 lb since oct  bmi of 35 Has really cut back on portions  Eating lots of vegetables  Has not counted calories  Has not tried weight watchers  Sister was dx with DM and she started [employer]  Was thinking about back surgery   Would consider water aerobics - looking into it  Can walk with a shopping cart   Back pain is chronic  No structural problem -  ? What it is coming from  Takes percocet prn  Pain is severe  ? If vascular problem could cause pain (also weak)  Little anemic  Lab Results  Component Value Date   WBC 5.3 10/13/2011   HGB 11.7* 10/13/2011   HCT 34.2* 10/13/2011   MCV 99.5 10/13/2011   PLT 262.0 10/13/2011  could be multifactorial   Lab Results  Component Value Date   TSH 0.67 10/13/2011     Vit D the same 28-- is not taking 4000 iu like she was asked too  Just on too many meds to remember She did not understand how important it was ? If would help her pain   Still has chronic back pain  Cannot take vicodin Tries to minimize percocet Is open to trying ultram Thinks it may actually be vascular in nature   Chol high Lab Results  Component Value Date   CHOL 262* 10/13/2011   CHOL 188 12/11/2010   CHOL 224* 05/05/2009   Lab Results  Component Value Date   HDL 46.60 10/13/2011   HDL 39.40 12/11/2010   HDL 38.60*  05/05/2009   Lab Results  Component Value Date   LDLCALC 127* 12/11/2010   Lab Results  Component Value Date   TRIG 143.0 10/13/2011   TRIG 108.0 12/11/2010   TRIG 146.0 05/05/2009   Lab Results  Component Value Date   CHOLHDL 6 10/13/2011   CHOLHDL 5 12/11/2010   CHOLHDL 6 05/05/2009   Lab Results  Component Value Date   LDLDIRECT 189.4 10/13/2011   LDLDIRECT 163.3 05/05/2009   LDLDIRECT 146.4 01/09/2009    Was really high  Diet - eating more saturated fats   Has not been on any cholesterol med  Has gotten chol down without it before   Patient Active Problem List  Diagnoses  . HYPERLIPIDEMIA  . ANXIETY  . PERSISTENT DISORDER INITIATING/MAINTAINING SLEEP  . DEPRESSION  . HYPERTENSION  . CORONARY ARTERY SPASM  . DYSRHYTHMIA, CARDIAC NOS  . EXTERNAL HEMORRHOIDS, WITH COMPLICATION  . ALLERGIC RHINITIS  . ASTHMA  . GERD  . IRRITABLE BOWEL SYNDROME  . OSTEOARTHRITIS  . OSTEOPOROSIS  . INSOMNIA  . SLEEP APNEA  . MURMUR  . MIGRAINES, HX OF  . POSTMENOPAUSAL STATUS  . AAA  . GALLBLADDER DISEASE  .  BACK PAIN  . AORTIC VALVE REPLACEMENT, HX OF  . CORONARY ARTERY BYPASS GRAFT, HX OF  . UNSPECIFIED ANEMIA  . DIZZINESS  . CARDIOMYOPATHY  . Chest pain  . Routine general medical examination at a health care facility  . Gynecological examination  . Bronchitis  . Vitamin D deficiency   Past Medical History  Diagnosis Date  . Allergy   . Asthma   . Anxiety   . Depression   . GERD (gastroesophageal reflux disease)   . Hyperlipidemia   . Hypertension   . Osteoporosis   . Gallbladder sludge   . AAA (abdominal aortic aneurysm)     with rupture and surgery  . Marfan syndrome   . RA (rheumatoid arthritis)     per Dr. Mallie Mussel   Past Surgical History  Procedure Date  . Cesarean section 1986 & 1991  . Cardiac catheterization 2001  . Fracture surgery     shoulder replacement  . Orif distal radius fracture   . Abdominal aortic aneurysm repair    History    Substance Use Topics  . Smoking status: Former Games developer  . Smokeless tobacco: Never Used  . Alcohol Use: Yes   Family History  Problem Relation Age of Onset  . Hypertension Mother   . Depression Mother   . Osteoporosis Mother   . Stroke Mother   . Arthritis Sister   . Aneurysm Paternal Grandmother   . Nephrolithiasis Sister   . Osteoporosis Sister    Allergies  Allergen Reactions  . Alendronate Sodium     REACTION: GI  . Aspirin     REACTION: bruising  . Hydrocodone-Acetaminophen     REACTION: itching  . Peanut-Containing Drug Products     REACTION: migraine  . Prednisone     REACTION: rash  . Tussionex Pennkinetic Er Itching   Current Outpatient Prescriptions on File Prior to Visit  Medication Sig Dispense Refill  . albuterol (PROVENTIL HFA;VENTOLIN HFA) 108 (90 BASE) MCG/ACT inhaler Inhale 2 puffs into the lungs every 4 (four) hours as needed.  1 Inhaler  3  . ALPRAZolam (XANAX) 1 MG tablet 1-2tabs by mouth twice a day as needed for anxiety, careful of sedation       . aspirin 81 MG tablet Take 81 mg by mouth daily.        . benzonatate (TESSALON) 100 MG capsule Take 1 capsule (100 mg total) by mouth 3 (three) times daily as needed for cough.  45 capsule  0  . carvedilol (COREG) 12.5 MG tablet Take 1 tablet (12.5 mg total) by mouth 2 (two) times daily with a meal.  180 tablet  3  . cetirizine (ZYRTEC) 10 MG tablet Take 10 mg by mouth daily.        . cyclobenzaprine (FLEXERIL) 10 MG tablet Take 1 tablet (10 mg total) by mouth 3 (three) times daily as needed.  90 tablet  2  . FLUoxetine (PROZAC) 20 MG tablet Take 20 mg by mouth daily.       . furosemide (LASIX) 20 MG tablet Take 1 tablet (20 mg total) by mouth 2 (two) times daily.  180 tablet  3  . guaiFENesin (MUCINEX) 600 MG 12 hr tablet Take 1,200 mg by mouth 2 (two) times daily.        Marland Kitchen lisinopril (PRINIVIL,ZESTRIL) 10 MG tablet Take 1 tablet (10 mg total) by mouth daily.  90 tablet  3  . omeprazole (PRILOSEC) 40 MG  capsule Take 1 capsule (40 mg  total) by mouth daily.  30 capsule  5  . Probiotic Product (PROBIOTIC FORMULA PO) Take 2 tablets by mouth daily.        Marland Kitchen Spacer/Aero-Holding Chambers (POCKET SPACER) DEVI Use with albuterol  1 each  0  . Dextromethorphan-Guaifenesin (REFENESEN DM PO) Take 1 tablet by mouth 3 (three) times daily as needed.        . fexofenadine (ALLEGRA) 180 MG tablet Take 180 mg by mouth daily.        . folic acid (FOLVITE) 1 MG tablet Take 1 mg by mouth daily.        Marland Kitchen lidocaine (LIDODERM) 5 % Place 1 patch onto the skin daily. Remove & Discard patch within 12 hours or as directed by MD       . magnesium gluconate (MAGONATE) 500 MG tablet Take 500 mg by mouth daily.        . methotrexate (RHEUMATREX) 2.5 MG tablet Take 10 mg by mouth once a week. Caution:Chemotherapy. Protect from light.       . zolpidem (AMBIEN) 10 MG tablet Take 1 tablet (10 mg total) by mouth at bedtime as needed.  30 tablet  0     Review of Systems Review of Systems  Constitutional: Negative for fever, appetite change, fatigue and unexpected weight change.  Eyes: Negative for pain and visual disturbance.  Respiratory: pos  for cough and neg for shortness of breath, pos for mild wheezing  Cardiovascular: Negative for cp or palpitations    Gastrointestinal: Negative for nausea, diarrhea and constipation.  Genitourinary: Negative for urgency and frequency.  Skin: Negative for pallor or rash   MSK pos for chronic back pain , no joint swelling  Neurological: Negative for weakness, light-headedness, numbness and headaches.  Hematological: Negative for adenopathy. Does not bruise/bleed easily.  Psychiatric/Behavioral: pos for depression on prozac, no SI         Objective:   Physical Exam  Constitutional: She appears well-developed and well-nourished. No distress.       Obese and well appearing  HENT:  Head: Normocephalic and atraumatic.  Right Ear: External ear normal.  Left Ear: External ear normal.    Mouth/Throat: Oropharynx is clear and moist.       Nares are boggy and post nasal drip noted   Eyes: Conjunctivae and EOM are normal. Pupils are equal, round, and reactive to light. No scleral icterus.  Neck: Normal range of motion. Neck supple. No JVD present. Carotid bruit is not present. No thyromegaly present.  Cardiovascular: Normal rate, regular rhythm, normal heart sounds and intact distal pulses.  Exam reveals no gallop.   Pulmonary/Chest: Effort normal and breath sounds normal. No respiratory distress. She has no wheezes.  Abdominal: Soft. Bowel sounds are normal. She exhibits no distension and no mass. There is no tenderness.  Musculoskeletal: Normal range of motion. She exhibits no edema and no tenderness.       No spinous process tenderness   Lymphadenopathy:    She has no cervical adenopathy.  Neurological: She is alert. She has normal reflexes. No cranial nerve deficit. She exhibits normal muscle tone. Coordination normal.  Skin: Skin is warm and dry. No rash noted. No erythema. No pallor.  Psychiatric: She has a normal mood and affect.       Talkative and cheerful today          Assessment & Plan:

## 2011-11-21 DIAGNOSIS — E6609 Other obesity due to excess calories: Secondary | ICD-10-CM | POA: Insufficient documentation

## 2011-11-21 DIAGNOSIS — E669 Obesity, unspecified: Secondary | ICD-10-CM | POA: Insufficient documentation

## 2011-11-21 NOTE — Assessment & Plan Note (Signed)
Not surprising that she is not loosing weight , due to her bad diet - both in food choices and portions, emotional eating and lack of exercise  Disc plan for wt loss with lifestyle change

## 2011-12-03 ENCOUNTER — Other Ambulatory Visit: Payer: Self-pay | Admitting: *Deleted

## 2011-12-03 MED ORDER — TRAMADOL HCL 50 MG PO TABS: ORAL_TABLET | ORAL | Status: DC

## 2011-12-03 NOTE — Telephone Encounter (Signed)
Will refill electronically  

## 2011-12-16 ENCOUNTER — Other Ambulatory Visit (INDEPENDENT_AMBULATORY_CARE_PROVIDER_SITE_OTHER): Payer: 59

## 2011-12-16 DIAGNOSIS — E785 Hyperlipidemia, unspecified: Secondary | ICD-10-CM

## 2011-12-16 DIAGNOSIS — D649 Anemia, unspecified: Secondary | ICD-10-CM

## 2011-12-16 LAB — CBC WITH DIFFERENTIAL/PLATELET
Basophils Absolute: 0 10*3/uL (ref 0.0–0.1)
Basophils Relative: 0.5 % (ref 0.0–3.0)
Eosinophils Absolute: 0.1 10*3/uL (ref 0.0–0.7)
Eosinophils Relative: 3.1 % (ref 0.0–5.0)
HCT: 35.8 % — ABNORMAL LOW (ref 36.0–46.0)
Hemoglobin: 12.1 g/dL (ref 12.0–15.0)
Lymphocytes Relative: 29.6 % (ref 12.0–46.0)
Lymphs Abs: 1.4 10*3/uL (ref 0.7–4.0)
MCHC: 33.8 g/dL (ref 30.0–36.0)
MCV: 98 fl (ref 78.0–100.0)
Monocytes Absolute: 0.4 10*3/uL (ref 0.1–1.0)
Monocytes Relative: 9 % (ref 3.0–12.0)
Neutro Abs: 2.7 10*3/uL (ref 1.4–7.7)
Neutrophils Relative %: 57.8 % (ref 43.0–77.0)
Platelets: 235 10*3/uL (ref 150.0–400.0)
RBC: 3.65 Mil/uL — ABNORMAL LOW (ref 3.87–5.11)
RDW: 13.3 % (ref 11.5–14.6)
WBC: 4.7 10*3/uL (ref 4.5–10.5)

## 2011-12-16 LAB — LIPID PANEL
Cholesterol: 172 mg/dL (ref 0–200)
HDL: 46.8 mg/dL (ref >39.00)
LDL Cholesterol: 100 mg/dL — ABNORMAL HIGH (ref 0–99)
Total CHOL/HDL Ratio: 4
Triglycerides: 124 mg/dL (ref 0.0–149.0)
VLDL: 24.8 mg/dL (ref 0.0–40.0)

## 2011-12-17 LAB — VITAMIN D 25 HYDROXY (VIT D DEFICIENCY, FRACTURES): Vit D, 25-Hydroxy: 22 ng/mL — ABNORMAL LOW (ref 30–89)

## 2011-12-27 ENCOUNTER — Encounter: Payer: Self-pay | Admitting: Family Medicine

## 2011-12-27 ENCOUNTER — Ambulatory Visit (INDEPENDENT_AMBULATORY_CARE_PROVIDER_SITE_OTHER): Payer: 59 | Admitting: Family Medicine

## 2011-12-27 VITALS — BP 116/64 | HR 84 | Temp 98.0°F | Ht 65.25 in | Wt 202.2 lb

## 2011-12-27 DIAGNOSIS — E785 Hyperlipidemia, unspecified: Secondary | ICD-10-CM

## 2011-12-27 DIAGNOSIS — I10 Essential (primary) hypertension: Secondary | ICD-10-CM

## 2011-12-27 DIAGNOSIS — E559 Vitamin D deficiency, unspecified: Secondary | ICD-10-CM

## 2011-12-27 NOTE — Assessment & Plan Note (Signed)
Level still low - enc to take her vit D3 otc 4000 iu daily  Will re check at f/u  Rev imp of D to both bone and overall health

## 2011-12-27 NOTE — Assessment & Plan Note (Signed)
Much improved with pravachol and diet Disc goals for lipids and reasons to control them Rev labs with pt Rev low sat fat diet in detail  F/u 6 mo

## 2011-12-27 NOTE — Assessment & Plan Note (Signed)
bp in fair control at this time  No changes needed  Disc lifstyle change with low sodium diet and exercise   F/u 6 mo  Enc to keep up wt loss

## 2011-12-27 NOTE — Patient Instructions (Signed)
Great job with the healthy diet and start exercise Be proud of your weight loss  bp and cholesterol good You need more vitamin D- please take 4000 iu daily over the counter  Follow up in 6 months

## 2011-12-27 NOTE — Progress Notes (Signed)
Subjective:    Patient ID: Rachel Torres, female    DOB: 1956/03/26, 56 y.o.   MRN: [id number]  HPI Here for f/u of cholesterol and vit D def and HTN  bp is 116/64    Today No cp or palpitations or headaches or edema  No side effects to medicines    Cholesterol well controlled with pravachol and diet  Lab Results  Component Value Date   CHOL 172 12/16/2011   CHOL 262* 10/13/2011   CHOL 188 12/11/2010   Lab Results  Component Value Date   HDL 46.80 12/16/2011   HDL 46.60 10/13/2011   HDL 39.40 12/11/2010   Lab Results  Component Value Date   LDLCALC 100* 12/16/2011   LDLCALC 127* 12/11/2010   Lab Results  Component Value Date   TRIG 124.0 12/16/2011   TRIG 143.0 10/13/2011   TRIG 108.0 12/11/2010   Lab Results  Component Value Date   CHOLHDL 4 12/16/2011   CHOLHDL 6 10/13/2011   CHOLHDL 5 12/11/2010   Lab Results  Component Value Date   LDLDIRECT 189.4 10/13/2011   LDLDIRECT 163.3 05/05/2009   LDLDIRECT 146.4 01/09/2009   diet- is great  No red meat/ fried food or high fat dairy Starting exercise   lowt 10 lb with bmi of 33 Is really working on it  [employer] the [employer]  Is eating the right carbohydrates  Wants to avoid DM - like her sisters  Is starting to work with the Wii   Vit D still low at 22 Was adv to take 4000 iu daily Has not started that yet  Does have aches and pains No fractures  Patient Active Problem List  Diagnoses  . HYPERLIPIDEMIA  . ANXIETY  . PERSISTENT DISORDER INITIATING/MAINTAINING SLEEP  . DEPRESSION  . HYPERTENSION  . CORONARY ARTERY SPASM  . DYSRHYTHMIA, CARDIAC NOS  . EXTERNAL HEMORRHOIDS, WITH COMPLICATION  . ALLERGIC RHINITIS  . ASTHMA  . GERD  . IRRITABLE BOWEL SYNDROME  . OSTEOARTHRITIS  . OSTEOPOROSIS  . INSOMNIA  . SLEEP APNEA  . MURMUR  . MIGRAINES, HX OF  . POSTMENOPAUSAL STATUS  . AAA  . GALLBLADDER DISEASE  . BACK PAIN  . AORTIC VALVE REPLACEMENT, HX OF  . CORONARY ARTERY BYPASS GRAFT, HX  OF  . UNSPECIFIED ANEMIA  . DIZZINESS  . CARDIOMYOPATHY  . Chest pain  . Routine general medical examination at a health care facility  . Gynecological examination  . Bronchitis  . Vitamin D deficiency  . Obesity   Past Medical History  Diagnosis Date  . Allergy   . Asthma   . Anxiety   . Depression   . GERD (gastroesophageal reflux disease)   . Hyperlipidemia   . Hypertension   . Osteoporosis   . Gallbladder sludge   . AAA (abdominal aortic aneurysm)     with rupture and surgery  . Marfan syndrome   . RA (rheumatoid arthritis)     per Dr. Mallie Mussel   Past Surgical History  Procedure Date  . Cesarean section 1986 & 1991  . Cardiac catheterization 2001  . Fracture surgery     shoulder replacement  . Orif distal radius fracture   . Abdominal aortic aneurysm repair    History  Substance Use Topics  . Smoking status: Former Games developer  . Smokeless tobacco: Never Used  . Alcohol Use: Yes   Family History  Problem Relation Age of Onset  . Hypertension Mother   . Depression Mother   .  Osteoporosis Mother   . Stroke Mother   . Arthritis Sister   . Aneurysm Paternal Grandmother   . Nephrolithiasis Sister   . Osteoporosis Sister    Allergies  Allergen Reactions  . Alendronate Sodium     REACTION: GI  . Aspirin     REACTION: bruising  . Hydrocodone-Acetaminophen     REACTION: itching  . Peanut-Containing Drug Products     REACTION: migraine  . Prednisone     REACTION: rash  . Tussionex Pennkinetic Er Itching   Current Outpatient Prescriptions on File Prior to Visit  Medication Sig Dispense Refill  . albuterol (PROVENTIL HFA;VENTOLIN HFA) 108 (90 BASE) MCG/ACT inhaler Inhale 2 puffs into the lungs every 4 (four) hours as needed.  1 Inhaler  3  . ALPRAZolam (XANAX) 1 MG tablet 1-2tabs by mouth twice a day as needed for anxiety, careful of sedation       . aspirin 81 MG tablet Take 81 mg by mouth daily.        . benzonatate (TESSALON) 100 MG capsule Take 1 capsule  (100 mg total) by mouth 3 (three) times daily as needed for cough.  45 capsule  0  . carvedilol (COREG) 12.5 MG tablet Take 1 tablet (12.5 mg total) by mouth 2 (two) times daily with a meal.  180 tablet  3  . cetirizine (ZYRTEC) 10 MG tablet Take 10 mg by mouth daily.        . cyclobenzaprine (FLEXERIL) 10 MG tablet Take 1 tablet (10 mg total) by mouth 3 (three) times daily as needed.  90 tablet  2  . Dextromethorphan-Guaifenesin (REFENESEN DM PO) Take 1 tablet by mouth 3 (three) times daily as needed.        Marland Kitchen FLUoxetine (PROZAC) 20 MG tablet Take 20 mg by mouth daily.       Marland Kitchen lisinopril (PRINIVIL,ZESTRIL) 10 MG tablet Take 1 tablet (10 mg total) by mouth daily.  90 tablet  3  . omeprazole (PRILOSEC) 40 MG capsule Take 1 capsule (40 mg total) by mouth daily.  30 capsule  5  . Probiotic Product (PROBIOTIC FORMULA PO) Take 2 tablets by mouth daily.        Marland Kitchen Spacer/Aero-Holding Chambers (POCKET SPACER) DEVI Use with albuterol  1 each  0  . traMADol (ULTRAM) 50 MG tablet 1-2 tablets by mouth up to every 6 hours as needed for pain  60 tablet  1  . fexofenadine (ALLEGRA) 180 MG tablet Take 180 mg by mouth daily.        . folic acid (FOLVITE) 1 MG tablet Take 1 mg by mouth daily.        . furosemide (LASIX) 20 MG tablet Take 1 tablet (20 mg total) by mouth 2 (two) times daily.  180 tablet  3  . guaiFENesin (MUCINEX) 600 MG 12 hr tablet Take 1,200 mg by mouth 2 (two) times daily.        Marland Kitchen lidocaine (LIDODERM) 5 % Place 1 patch onto the skin daily. Remove & Discard patch within 12 hours or as directed by MD       . magnesium gluconate (MAGONATE) 500 MG tablet Take 500 mg by mouth daily.        . methotrexate (RHEUMATREX) 2.5 MG tablet Take 10 mg by mouth once a week. Caution:Chemotherapy. Protect from light.       . zolpidem (AMBIEN) 10 MG tablet Take 1 tablet (10 mg total) by mouth at bedtime as needed.  30  tablet  0      Review of Systems Review of Systems  Constitutional: Negative for fever,  appetite change,  and unexpected weight change. pos for fatigue  Eyes: Negative for pain and visual disturbance.  Respiratory: Negative for cough and shortness of breath.   Cardiovascular: Negative for cp or palpitations    Gastrointestinal: Negative for nausea, diarrhea and constipation.  Genitourinary: Negative for urgency and frequency.  Skin: Negative for pallor or rash   MSK pos for chronic back pain  Neurological: Negative for weakness, light-headedness, numbness and headaches.  Hematological: Negative for adenopathy. Does not bruise/bleed easily.  Psychiatric/Behavioral: Negative for dysphoric mood. The patient is not nervous/anxious.            Objective:   Physical Exam  Constitutional: She appears well-developed and well-nourished. No distress.       overwt and well appearing   HENT:  Head: Normocephalic and atraumatic.  Mouth/Throat: Oropharynx is clear and moist.  Eyes: Conjunctivae and EOM are normal. Pupils are equal, round, and reactive to light.  Neck: Normal range of motion. Neck supple. No JVD present. Carotid bruit is not present. Erythema present. No thyromegaly present.  Cardiovascular: Normal rate, regular rhythm and intact distal pulses.  Exam reveals no gallop.   Murmur heard. Pulmonary/Chest: Effort normal and breath sounds normal. No respiratory distress. She has no wheezes. She has no rales. She exhibits no tenderness.  Abdominal: Bowel sounds are normal. She exhibits no distension, no abdominal bruit and no mass. There is no tenderness.       Baseline scars present   Musculoskeletal: She exhibits no edema and no tenderness.       Limited rom LS  Lymphadenopathy:    She has no cervical adenopathy.  Neurological: She is alert. She has normal reflexes. No cranial nerve deficit. She exhibits normal muscle tone. Coordination normal.  Skin: Skin is warm and dry. No rash noted. No erythema. No pallor.  Psychiatric: She has a normal mood and affect.        Cheerful and talkative           Assessment & Plan:

## 2012-01-03 ENCOUNTER — Other Ambulatory Visit: Payer: Self-pay | Admitting: *Deleted

## 2012-01-03 MED ORDER — CYCLOBENZAPRINE HCL 10 MG PO TABS: 10.0000 mg | ORAL_TABLET | Freq: Three times a day (TID) | ORAL | Status: DC | PRN

## 2012-01-03 NOTE — Telephone Encounter (Signed)
Faxed refill request   

## 2012-01-03 NOTE — Telephone Encounter (Signed)
Will refill electronically  

## 2012-03-21 ENCOUNTER — Ambulatory Visit: Payer: Self-pay | Admitting: Family Medicine

## 2012-03-29 ENCOUNTER — Telehealth: Payer: Self-pay | Admitting: Family Medicine

## 2012-03-29 NOTE — Telephone Encounter (Signed)
Caller: Liboria/Patient; PCP: Tower, Marne A.; CB#: [phone]; Call regarding Chest Congestion, URI; greenish/brown sputum.  Afebrile/subjective.  Reports Pulse Ox 97-98.  See in 4 hours per Cough protocol.  No appointments available at office this date.  Spoke with Asher Muir at office and was advised to send to [facility] for evaluation. Caller advised to to to [facility].   Caller states she will go to UC on [address].

## 2012-03-30 ENCOUNTER — Telehealth: Payer: Self-pay | Admitting: Family Medicine

## 2012-03-30 NOTE — Telephone Encounter (Signed)
Triage Record Num: [id number] Operator: Tomasita Crumble Patient Name: Rachel Torres Call Date & Time: 03/29/2012 1:42:24PM Patient Phone: [phone] PCP: Audrie Gallus. Tower Patient Gender: Female PCP Fax : Patient DOB: Dec 13, 1956 Practice Name: [facility] Reason for Call: Caller: Tehya/Patient; PCP: Roxy Manns A.; CB#: [phone]; Call regarding Chest Congestion, URI; greenish/brown sputum. Afebrile/subjective. Reports Pulse Ox 97-98. See in 4 hours per Cough protocol. No appointments available at office this date. Spoke with Asher Muir at office and was advised to send to [facility] for evaluation. Caller advised to to to [facility]. Caller states she will go to UC on [address]. Protocol(s) Used: Cough - Adult Recommended Outcome per Protocol: See Provider within 4 hours Reason for Outcome: New or worsening cough AND known cardiac or respiratory condition Care Advice: Limit or avoid exposure to irritants and allergens (e.g. air pollution, smoke/smoking, chemicals, dust, pollen, pet dander, etc.) ~ ~ Continue all prescription medication as recommended until evaluated by provider. ~ Call provider if symptoms worsen or new symptoms develop. ~ Avoid any activity that produces symptoms until evaluated by provider. Most adults need to drink 6-10 eight-ounce glasses (1.2-2.0 liters) of fluids per day unless previously told to limit fluid intake for other medical reasons. Limit fluids that contain caffeine, sugar or alcohol. Urine will be a very light yellow color when you drink enough fluids. ~ 03/29/2012 2:03:17PM Page 1 of 1 [id number]

## 2012-04-07 ENCOUNTER — Other Ambulatory Visit: Payer: Self-pay | Admitting: *Deleted

## 2012-04-07 MED ORDER — OMEPRAZOLE 40 MG PO CPDR: 40.0000 mg | DELAYED_RELEASE_CAPSULE | Freq: Every day | ORAL | Status: DC

## 2012-05-12 ENCOUNTER — Ambulatory Visit (INDEPENDENT_AMBULATORY_CARE_PROVIDER_SITE_OTHER): Payer: Self-pay | Admitting: Cardiovascular Disease

## 2012-05-12 ENCOUNTER — Other Ambulatory Visit: Payer: Self-pay | Admitting: Cardiovascular Disease

## 2012-05-12 ENCOUNTER — Encounter: Payer: Self-pay | Admitting: Cardiovascular Disease

## 2012-05-12 ENCOUNTER — Telehealth: Payer: Self-pay

## 2012-05-12 VITALS — BP 140/90 | HR 75 | Ht 65.0 in | Wt 207.0 lb

## 2012-05-12 DIAGNOSIS — I499 Cardiac arrhythmia, unspecified: Secondary | ICD-10-CM

## 2012-05-12 DIAGNOSIS — Z954 Presence of other heart-valve replacement: Secondary | ICD-10-CM

## 2012-05-12 DIAGNOSIS — R079 Chest pain, unspecified: Secondary | ICD-10-CM

## 2012-05-12 DIAGNOSIS — I7100 Dissection of unspecified site of aorta: Secondary | ICD-10-CM

## 2012-05-12 DIAGNOSIS — D649 Anemia, unspecified: Secondary | ICD-10-CM

## 2012-05-12 DIAGNOSIS — Z8679 Personal history of other diseases of the circulatory system: Secondary | ICD-10-CM | POA: Insufficient documentation

## 2012-05-12 DIAGNOSIS — I10 Essential (primary) hypertension: Secondary | ICD-10-CM

## 2012-05-12 LAB — CBC WITH DIFFERENTIAL/PLATELET
Basophil #: 0 10*3/uL (ref 0.0–0.1)
Basophil %: 0.5 %
Eosinophil #: 0.2 10*3/uL (ref 0.0–0.7)
Eosinophil %: 3.8 %
HCT: 37 % (ref 35.0–47.0)
HGB: 11.9 g/dL — ABNORMAL LOW (ref 12.0–16.0)
Lymphocyte #: 1.4 10*3/uL (ref 1.0–3.6)
Lymphocyte %: 28 %
MCH: 30.8 pg (ref 26.0–34.0)
MCHC: 32.2 g/dL (ref 32.0–36.0)
MCV: 96 fL (ref 80–100)
Monocyte #: 0.4 x10 3/mm (ref 0.2–0.9)
Monocyte %: 8.6 %
Neutrophil #: 2.9 10*3/uL (ref 1.4–6.5)
Neutrophil %: 59.1 %
Platelet: 235 10*3/uL (ref 150–440)
RBC: 3.87 10*6/uL (ref 3.80–5.20)
RDW: 13.9 % (ref 11.5–14.5)
WBC: 4.9 10*3/uL (ref 3.6–11.0)

## 2012-05-12 LAB — BASIC METABOLIC PANEL
Anion Gap: 7 (ref 7–16)
BUN: 14 mg/dL (ref 7–18)
Calcium, Total: 8.4 mg/dL — ABNORMAL LOW (ref 8.5–10.1)
Chloride: 107 mmol/L (ref 98–107)
Co2: 27 mmol/L (ref 21–32)
Creatinine: 0.96 mg/dL (ref 0.60–1.30)
EGFR (African American): >60
EGFR (Non-African Amer.): >60
Glucose: 93 mg/dL (ref 65–99)
Osmolality: 281 (ref 275–301)
Potassium: 4 mmol/L (ref 3.5–5.1)
Sodium: 141 mmol/L (ref 136–145)

## 2012-05-12 MED ORDER — AMLODIPINE BESYLATE 5 MG PO TABS: 5.0000 mg | ORAL_TABLET | Freq: Two times a day (BID) | ORAL | Status: DC

## 2012-05-12 NOTE — Telephone Encounter (Signed)
Pt reports BP high this am 168/94. Usual BP is 120/70.  She is also c/o h/a and chest discomfort. I advised her to come on in to be assessed by Dr. [name].  She verb. Understanding.

## 2012-05-12 NOTE — Assessment & Plan Note (Signed)
She'll need echocardiogram on an annual basis to evaluate her aortic valve and aortic root.

## 2012-05-12 NOTE — Telephone Encounter (Signed)
Reviewed labs with Dr. [name]. Per Dr. [name], no change in regimen.  Hgb=11.9.  Pt informed. Understanding verb.

## 2012-05-12 NOTE — Assessment & Plan Note (Signed)
She does appear pale on today's visit. We will repeat her CBC Stat

## 2012-05-12 NOTE — Assessment & Plan Note (Signed)
Blood pressure is elevated. We will start amlodipine 2.5 mg twice a day. This can be titrated upwards if needed for blood pressure control.

## 2012-05-12 NOTE — Assessment & Plan Note (Signed)
Type A aortic dissection followed at [facility]. Next followup is in December 2013. This has been relatively stable though now with new symptoms of abdominal pain, vague new regions of numbness. We have asked her to call her vascular surgeon at [facility]. They may want to evaluate her and possibly repeat a CT scan if symptoms persist.

## 2012-05-12 NOTE — Patient Instructions (Addendum)
You are doing well. Start amlodipine 2.5 mg twice a day Monitor you blood pressure It is ok to increase the amlodipine at home for high blood pressure  We will check blood work today  Please call us if you have new issues that need to be addressed before your next appt.  Your physician wants you to follow-up in: 1 months.  You will receive a reminder letter in the mail two months in advance. If you don't receive a letter, please call our office to schedule the follow-up appointment.

## 2012-05-12 NOTE — Progress Notes (Signed)
Patient ID: Rachel Torres, female    DOB: 1956-12-18, 56 y.o.   MRN: [id number]  HPI Comments: [name] is a very pleasant 56 year old woman who suffered an aorta type A dissection in September 2011 who was rushed to [facility] and underwent aortic valve replacement, bypass of the right coronary artery with venous donor site from her right thigh, aortic grafting for a extensive dissection of the  ascending aorta, residual descending aortic dissection extending into the iliac artery, with chronic back pain who presents for symptoms of shortness of breath, abdominal pain, hypertension and malaise.   She reports that she has had several days of discomfort in her upper epigastric area. It hurts when she pushes inward. It does not feel like GI etiology. She's also had worsening shortness of breath recently, but pressure measurements have also been high with systolic pressures frequently in the 150-160 range. She has had new areas that are "getting numb", like on her legs and arms.   Previous H/o abdominal pain, further workup showing biliary stones and sludge. s/p  laparoscopic cholecystectomy at [facility].  She takes Lasix for urinary retention. She denies any significant lower extremity edema   Previous echocardiogram  shows moderately depressed LV systolic function estimated at 30-35%, hypokinesis of the anterior, anteroseptal wall, diastolic dysfunction, prosthetic valve is well seated with gradient of 11 mm mercury, 16 mm at peak, mild MR, mild to moderate TR with normal right ventricular systolic pressures   stress test did not show ischemia in the anterior wall. There is suggestion of mild ischemia near the lateral apex.   EKG shows normal sinus rhythm with rate 75 beats per minute, PVCs noted, first degree AV block   Outpatient Encounter Prescriptions as of 05/12/2012  Medication Sig Dispense Refill  . albuterol (PROVENTIL HFA;VENTOLIN HFA) 108 (90 BASE) MCG/ACT inhaler Inhale 2 puffs into the  lungs every 4 (four) hours as needed.  1 Inhaler  3  . ALPRAZolam (XANAX) 1 MG tablet 1-2tabs by mouth twice a day as needed for anxiety, careful of sedation       . aspirin 81 MG tablet Take 81 mg by mouth daily.        . carvedilol (COREG) 12.5 MG tablet Take 1 tablet (12.5 mg total) by mouth 2 (two) times daily with a meal.  180 tablet  3  . cyclobenzaprine (FLEXERIL) 10 MG tablet Take 1 tablet (10 mg total) by mouth 3 (three) times daily as needed.  90 tablet  0  . FLUoxetine (PROZAC) 20 MG tablet Take 20 mg by mouth 2 (two) times daily.       Marland Kitchen omeprazole (PRILOSEC) 40 MG capsule Take 1 capsule (40 mg total) by mouth daily.  30 capsule  6  . pravastatin (PRAVACHOL) 20 MG tablet Take 20 mg by mouth daily.        . Probiotic Product (PROBIOTIC FORMULA PO) Take 2 tablets by mouth daily.        Marland Kitchen Spacer/Aero-Holding Chambers (POCKET SPACER) DEVI Use with albuterol  1 each  0  . traMADol (ULTRAM) 50 MG tablet 1-2 tablets by mouth up to every 6 hours as needed for pain  60 tablet  1  . amLODipine (NORVASC) 5 MG tablet Take 1 tablet (5 mg total) by mouth 2 (two) times daily.  60 tablet  11  . benzonatate (TESSALON) 100 MG capsule Take 1 capsule (100 mg total) by mouth 3 (three) times daily as needed for cough.  45 capsule  0  . fexofenadine (ALLEGRA) 180 MG tablet Take 180 mg by mouth daily.          Review of Systems  Constitutional: Negative.   HENT: Negative.   Eyes: Negative.   Respiratory: Positive for shortness of breath.   Cardiovascular: Positive for chest pain.  Gastrointestinal: Positive for abdominal pain.  Musculoskeletal: Negative.   Skin: Negative.   Neurological: Negative.        Memory problems.  Hematological: Negative.   Psychiatric/Behavioral: Negative.   All other systems reviewed and are negative.   BP 140/90  Pulse 75  Wt 207 lb (93.895 kg)  Physical Exam  Nursing note and vitals reviewed. Constitutional: She is oriented to person, place, and time. She  appears well-developed and well-nourished.       She appears mildly pale  HENT:  Head: Normocephalic.  Nose: Nose normal.  Mouth/Throat: Oropharynx is clear and moist.  Eyes: Conjunctivae are normal. Pupils are equal, round, and reactive to light.  Neck: Normal range of motion. Neck supple. No JVD present.  Cardiovascular: Normal rate, regular rhythm, S1 normal, S2 normal and intact distal pulses.  Exam reveals no gallop and no friction rub.   Murmur heard.  Crescendo systolic murmur is present with a grade of 2/6       Decreased right radial pulse, left lower extremity pulses  Pulmonary/Chest: Effort normal and breath sounds normal. No respiratory distress. She has no wheezes. She has no rales. She exhibits no tenderness.       Well-healed mediastinal scar  Abdominal: Soft. Bowel sounds are normal. She exhibits no distension. There is no tenderness.  Musculoskeletal: Normal range of motion. She exhibits no edema and no tenderness.  Lymphadenopathy:    She has no cervical adenopathy.  Neurological: She is alert and oriented to person, place, and time. Coordination normal.  Skin: Skin is warm and dry. No rash noted. No erythema.  Psychiatric: She has a normal mood and affect. Her behavior is normal. Judgment and thought content normal.         Assessment and Plan

## 2012-05-12 NOTE — Telephone Encounter (Signed)
Patient having problems with blood pressure running high around 168/98 HR of 70 for the past several days. The patient has an appointment with Dr. Elease Hashimoto on May 16, 2012. The patient is concerned with the pressure running so high with her having the AAA. Please advise if she needs to be concerned about this or if she needs to do anything before her appointment on May 16, 2012.

## 2012-05-16 ENCOUNTER — Ambulatory Visit: Payer: 59 | Admitting: Cardiovascular Disease

## 2012-05-23 ENCOUNTER — Ambulatory Visit (INDEPENDENT_AMBULATORY_CARE_PROVIDER_SITE_OTHER): Payer: Self-pay | Admitting: Family Medicine

## 2012-05-23 ENCOUNTER — Telehealth: Payer: Self-pay | Admitting: Family Medicine

## 2012-05-23 ENCOUNTER — Encounter: Payer: Self-pay | Admitting: Family Medicine

## 2012-05-23 VITALS — BP 136/80 | HR 77 | Temp 98.2°F | Wt 204.8 lb

## 2012-05-23 DIAGNOSIS — R110 Nausea: Secondary | ICD-10-CM

## 2012-05-23 MED ORDER — PROMETHAZINE HCL 25 MG PO TABS: 25.0000 mg | ORAL_TABLET | Freq: Three times a day (TID) | ORAL | Status: DC | PRN

## 2012-05-23 NOTE — Patient Instructions (Signed)
Drink sips of fluids, not dairy, and use the phenergan is needed.  This should gradually improved.

## 2012-05-23 NOTE — Progress Notes (Signed)
No new meds except for addition of amlodipine.  She went dancing Friday night.  She was 'dipped' dancing Friday night and then had some upper abd pain and back pain.  Since then she was nauseated persistently and vomited on Saturday.  Burping more than normal, more GERD sx.  No diarrhea.  No fevers, but occ can feel hot/cold.  She is fatigued.  H/o cholecystectomy.  No bleeding, no blood in vomit.  Able to drink fluids, but dec in PO solids.  Appetite is decreased.    Her mother had several days of nausea recently.    Meds, vitals, and allergies reviewed.   ROS: See HPI.  Otherwise, noncontributory.  nad ncat Mmm rrr ctab Midline sternotomy scar noted abd soft, not ttp, normal BS, no rebound No rash Ext well perfused.

## 2012-05-23 NOTE — Telephone Encounter (Signed)
Caller: Rachel Torres/Patient; PCP: Torres, Rachel Schuller A.; CB#: [phone]; ; ; Call regarding Onset 05/20/12 Nausea;  states vomited x 1 that day, but none since, but is so nauseated she cannot eat.  No diarrhea.  Voiding qs.  BP 126/63.  Per protocol, emergent symptoms denied; advised appt within 72 hours.  Appt sched 1500 05/23/12 with Dr. Para March.

## 2012-05-24 ENCOUNTER — Encounter: Payer: Self-pay | Admitting: Family Medicine

## 2012-05-24 DIAGNOSIS — R110 Nausea: Secondary | ICD-10-CM | POA: Insufficient documentation

## 2012-05-24 NOTE — Assessment & Plan Note (Signed)
Nontoxic, she may have a viral GI illness with the hot/cold feeling, aches and vomiting.  Would use promethazine prn, drink sips of fluids and f/u prn.  Her history is noted and d/w pt, but I don't see reason to suspect ominous dx.  F/u prn

## 2012-05-29 ENCOUNTER — Ambulatory Visit: Payer: 59 | Admitting: Cardiovascular Disease

## 2012-06-12 ENCOUNTER — Ambulatory Visit: Payer: Self-pay | Admitting: Cardiovascular Disease

## 2012-06-14 ENCOUNTER — Encounter: Payer: Self-pay | Admitting: Cardiovascular Disease

## 2012-06-14 ENCOUNTER — Ambulatory Visit (INDEPENDENT_AMBULATORY_CARE_PROVIDER_SITE_OTHER): Payer: Self-pay | Admitting: Cardiovascular Disease

## 2012-06-14 VITALS — BP 140/82 | HR 71 | Ht 65.0 in | Wt 211.0 lb

## 2012-06-14 DIAGNOSIS — Z951 Presence of aortocoronary bypass graft: Secondary | ICD-10-CM

## 2012-06-14 DIAGNOSIS — I10 Essential (primary) hypertension: Secondary | ICD-10-CM

## 2012-06-14 DIAGNOSIS — R079 Chest pain, unspecified: Secondary | ICD-10-CM

## 2012-06-14 DIAGNOSIS — I7100 Dissection of unspecified site of aorta: Secondary | ICD-10-CM

## 2012-06-14 DIAGNOSIS — Z954 Presence of other heart-valve replacement: Secondary | ICD-10-CM

## 2012-06-14 MED ORDER — PRAVASTATIN SODIUM 20 MG PO TABS: 20.0000 mg | ORAL_TABLET | Freq: Every day | ORAL | Status: DC

## 2012-06-14 MED ORDER — AMLODIPINE BESYLATE 5 MG PO TABS: 5.0000 mg | ORAL_TABLET | Freq: Two times a day (BID) | ORAL | Status: DC

## 2012-06-14 MED ORDER — CARVEDILOL 12.5 MG PO TABS: 12.5000 mg | ORAL_TABLET | Freq: Two times a day (BID) | ORAL | Status: DC

## 2012-06-14 NOTE — Assessment & Plan Note (Signed)
She has annual followup at [facility]. Scheduled for later this year. No suggestion of dissection progression.

## 2012-06-14 NOTE — Assessment & Plan Note (Signed)
Repeat echocardiogram next year to evaluate her aortic valve. No clinical signs of heart failure.

## 2012-06-14 NOTE — Assessment & Plan Note (Signed)
Chest pain is likely noncardiac. No further workup needed at this time. Likely musculoskeletal.

## 2012-06-14 NOTE — Patient Instructions (Addendum)
You are doing well. Please increase amlodipine to 5 mg a day for high blood pressure  Please call us if you have new issues that need to be addressed before your next appt.  Your physician wants you to follow-up in: 6 months.  You will receive a reminder letter in the mail two months in advance. If you don't receive a letter, please call our office to schedule the follow-up appointment.

## 2012-06-14 NOTE — Assessment & Plan Note (Signed)
We have suggested she increase her amlodipine to 5 mg daily for mild hypertension.

## 2012-06-14 NOTE — Progress Notes (Signed)
Patient ID: Rachel Torres, female    DOB: [birth year], 56 y.o.   MRN: [id number]  HPI Comments: Rachel Torres is a very pleasant 56 year old woman who suffered an aorta type A dissection in September 2011 who was rushed to [facility] and underwent aortic valve replacement, bypass of the right coronary artery with venous donor site from her right thigh, aortic grafting for a extensive dissection of the  ascending aorta, residual descending aortic dissection extending into the iliac artery, with chronic back pain who presents for symptoms of shortness of breath, abdominal pain, hypertension and malaise.   She has been very active recently. She has been dancing, taking any gentleman, recent week at the beach with drinking alcohol. Her chest is hurting and she thinks that she may have overdone it with dancing. She has not tried NSAIDs yet. Otherwise she feels well with no complaints and is happy. Her niece has moved in with her which has added more stress.  Previous H/o abdominal pain, further workup showing biliary stones and sludge. s/p  laparoscopic cholecystectomy at [facility].  She takes Lasix for urinary retention. She denies any significant lower extremity edema   Previous echocardiogram  shows moderately depressed LV systolic function estimated at 30-35%, hypokinesis of the anterior, anteroseptal wall, diastolic dysfunction, prosthetic valve is well seated with gradient of 11 mm mercury, 16 mm at peak, mild MR, mild to moderate TR with normal right ventricular systolic pressures   stress test did not show ischemia in the anterior wall. There is suggestion of mild ischemia near the lateral apex.   EKG shows normal sinus rhythm with rate 71 beats per minute, first degree AV block, consider old anteroseptal infarct    Outpatient Encounter Prescriptions as of 06/14/2012  Medication Sig Dispense Refill  . albuterol (PROVENTIL HFA;VENTOLIN HFA) 108 (90 BASE) MCG/ACT inhaler Inhale 2 puffs into the lungs  every 4 (four) hours as needed.  1 Inhaler  3  . ALPRAZolam (XANAX) 1 MG tablet 1-2tabs by mouth twice a day as needed for anxiety, careful of sedation       . amLODipine (NORVASC) 5 MG tablet Take 1 tablet (5 mg total) by mouth 2 (two) times daily.  60 tablet  11  . aspirin 81 MG tablet Take 81 mg by mouth daily.        . benzonatate (TESSALON) 100 MG capsule Take 1 capsule (100 mg total) by mouth 3 (three) times daily as needed for cough.  45 capsule  0  . carvedilol (COREG) 12.5 MG tablet Take 1 tablet (12.5 mg total) by mouth 2 (two) times daily with a meal.  180 tablet  3  . cyclobenzaprine (FLEXERIL) 10 MG tablet Take 1 tablet (10 mg total) by mouth 3 (three) times daily as needed.  90 tablet  0  . fexofenadine (ALLEGRA) 180 MG tablet Take 180 mg by mouth daily.        Marland Kitchen FLUoxetine (PROZAC) 20 MG tablet Take 20 mg by mouth 2 (two) times daily.       Marland Kitchen omeprazole (PRILOSEC) 40 MG capsule Take 1 capsule (40 mg total) by mouth daily.  30 capsule  6  . pravastatin (PRAVACHOL) 20 MG tablet Take 1 tablet (20 mg total) by mouth daily.  30 tablet  11  . Probiotic Product (PROBIOTIC FORMULA PO) Take 2 tablets by mouth daily.        . promethazine (PHENERGAN) 25 MG tablet Take 25 mg by mouth every 8 (eight) hours as needed.      Marland Kitchen  Spacer/Aero-Holding Chambers (POCKET SPACER) DEVI Use with albuterol  1 each  0  . traMADol (ULTRAM) 50 MG tablet 1-2 tablets by mouth up to every 6 hours as needed for pain  60 tablet  1   Review of Systems  Constitutional: Negative.   HENT: Negative.   Eyes: Negative.   Cardiovascular: Positive for chest pain.  Musculoskeletal: Negative.   Skin: Negative.   Neurological: Negative.        Memory problems.  Hematological: Negative.   Psychiatric/Behavioral: Negative.   All other systems reviewed and are negative.   BP 140/82  Pulse 71  Ht 5\' 5"  (1.651 m)  Wt 211 lb (95.709 kg)  BMI 35.11 kg/m2  Physical Exam  Nursing note and vitals  reviewed. Constitutional: She is oriented to person, place, and time. She appears well-developed and well-nourished.  HENT:  Head: Normocephalic.  Nose: Nose normal.  Mouth/Throat: Oropharynx is clear and moist.  Eyes: Conjunctivae are normal. Pupils are equal, round, and reactive to light.  Neck: Normal range of motion. Neck supple. No JVD present.  Cardiovascular: Normal rate, regular rhythm, S1 normal, S2 normal and intact distal pulses.  Exam reveals no gallop and no friction rub.   Murmur heard.  Crescendo systolic murmur is present with a grade of 2/6       Decreased right radial pulse, left lower extremity pulses  Pulmonary/Chest: Effort normal and breath sounds normal. No respiratory distress. She has no wheezes. She has no rales. She exhibits no tenderness.       Well-healed mediastinal scar  Abdominal: Soft. Bowel sounds are normal. She exhibits no distension. There is no tenderness.  Musculoskeletal: Normal range of motion. She exhibits no edema and no tenderness.  Lymphadenopathy:    She has no cervical adenopathy.  Neurological: She is alert and oriented to person, place, and time. Coordination normal.  Skin: Skin is warm and dry. No rash noted. No erythema.  Psychiatric: She has a normal mood and affect. Her behavior is normal. Judgment and thought content normal.         Assessment and Plan

## 2012-07-03 ENCOUNTER — Ambulatory Visit (INDEPENDENT_AMBULATORY_CARE_PROVIDER_SITE_OTHER): Payer: Self-pay | Admitting: Family Medicine

## 2012-07-03 ENCOUNTER — Encounter: Payer: Self-pay | Admitting: Family Medicine

## 2012-07-03 VITALS — BP 110/68 | HR 79 | Temp 97.8°F | Ht 65.0 in | Wt 210.5 lb

## 2012-07-03 DIAGNOSIS — I10 Essential (primary) hypertension: Secondary | ICD-10-CM

## 2012-07-03 DIAGNOSIS — F329 Major depressive disorder, single episode, unspecified: Secondary | ICD-10-CM

## 2012-07-03 DIAGNOSIS — F3289 Other specified depressive episodes: Secondary | ICD-10-CM

## 2012-07-03 DIAGNOSIS — E559 Vitamin D deficiency, unspecified: Secondary | ICD-10-CM | POA: Insufficient documentation

## 2012-07-03 DIAGNOSIS — D649 Anemia, unspecified: Secondary | ICD-10-CM

## 2012-07-03 DIAGNOSIS — E785 Hyperlipidemia, unspecified: Secondary | ICD-10-CM

## 2012-07-03 LAB — CBC WITH DIFFERENTIAL/PLATELET
Basophils Absolute: 0 10*3/uL (ref 0.0–0.1)
Basophils Relative: 0.5 % (ref 0.0–3.0)
Eosinophils Absolute: 0.2 10*3/uL (ref 0.0–0.7)
Eosinophils Relative: 3.5 % (ref 0.0–5.0)
HCT: 36.5 % (ref 36.0–46.0)
Hemoglobin: 12.2 g/dL (ref 12.0–15.0)
Lymphocytes Relative: 27.4 % (ref 12.0–46.0)
Lymphs Abs: 1.5 10*3/uL (ref 0.7–4.0)
MCHC: 33.5 g/dL (ref 30.0–36.0)
MCV: 94 fl (ref 78.0–100.0)
Monocytes Absolute: 0.5 10*3/uL (ref 0.1–1.0)
Monocytes Relative: 9.1 % (ref 3.0–12.0)
Neutro Abs: 3.2 10*3/uL (ref 1.4–7.7)
Neutrophils Relative %: 59.5 % (ref 43.0–77.0)
Platelets: 242 10*3/uL (ref 150.0–400.0)
RBC: 3.88 Mil/uL (ref 3.87–5.11)
RDW: 14 % (ref 11.5–14.6)
WBC: 5.3 10*3/uL (ref 4.5–10.5)

## 2012-07-03 LAB — LIPID PANEL
Cholesterol: 179 mg/dL (ref 0–200)
HDL: 50.4 mg/dL (ref >39.00)
LDL Cholesterol: 104 mg/dL — ABNORMAL HIGH (ref 0–99)
Total CHOL/HDL Ratio: 4
Triglycerides: 125 mg/dL (ref 0.0–149.0)
VLDL: 25 mg/dL (ref 0.0–40.0)

## 2012-07-03 LAB — ALT: ALT: 19 U/L (ref 0–35)

## 2012-07-03 LAB — AST: AST: 22 U/L (ref 0–37)

## 2012-07-03 MED ORDER — TRAMADOL HCL 50 MG PO TABS: ORAL_TABLET | ORAL | Status: DC

## 2012-07-03 MED ORDER — FLUOXETINE HCL 20 MG PO TABS: 20.0000 mg | ORAL_TABLET | Freq: Every day | ORAL | Status: DC

## 2012-07-03 MED ORDER — ALPRAZOLAM 1 MG PO TABS: 1.0000 mg | ORAL_TABLET | Freq: Two times a day (BID) | ORAL | Status: DC | PRN

## 2012-07-03 NOTE — Progress Notes (Signed)
Subjective:    Patient ID: Rachel Torres, female    DOB: June 20, 1956, 56 y.o.   MRN: [id number]  HPI Here for f/u of chronic conditions Is doing well - has a new boyfriend  A lot to balance in life  Still goes to her support group once weekly   Thinks she needs to check her hearing - seems muffled -thinks it is age related, not ready for hearing aid yet   bp is good    Today BP Readings from Last 3 Encounters:  07/03/12 110/68  06/14/12 140/82  05/23/12 136/80    No cp or palpitations or headaches or edema  No side effects to medicines    Lipids had improved with pravachol and diet  Lab Results  Component Value Date   CHOL 172 12/16/2011   HDL 46.80 12/16/2011   LDLCALC 100* 12/16/2011   LDLDIRECT 189.4 10/13/2011   TRIG 124.0 12/16/2011   CHOLHDL 4 12/16/2011    Diet - not paying attn to her diet  Feet have been swelling when standing  Dependent - goes down at night   Following vit D level- is still taking that and MVI Also probiotic- helps IBS  Wt is stable with bmi of 35  Had labs at [facility] in may with Dr [name]  Hb was 11.9 Lab Results  Component Value Date   WBC 4.7 12/16/2011   HGB 12.1 12/16/2011   HCT 35.8* 12/16/2011   MCV 98.0 12/16/2011   PLT 235.0 12/16/2011     Cannot afford to see her psychiatrist - wants to stop her prozac Does not think she needs it  Thinks she is coping very well   Patient Active Problem List  Diagnosis  . HYPERLIPIDEMIA  . PERSISTENT DISORDER INITIATING/MAINTAINING SLEEP  . Depression with anxiety  . HYPERTENSION  . CORONARY ARTERY SPASM  . DYSRHYTHMIA, CARDIAC NOS  . EXTERNAL HEMORRHOIDS, WITH COMPLICATION  . ALLERGIC RHINITIS  . ASTHMA  . GERD  . IRRITABLE BOWEL SYNDROME  . OSTEOARTHRITIS  . OSTEOPOROSIS  . INSOMNIA  . SLEEP APNEA  . MURMUR  . MIGRAINES, HX OF  . POSTMENOPAUSAL STATUS  . AAA  . GALLBLADDER DISEASE  . BACK PAIN  . AORTIC VALVE REPLACEMENT, HX OF  . CORONARY ARTERY BYPASS GRAFT, HX OF    . UNSPECIFIED ANEMIA  . DIZZINESS  . CARDIOMYOPATHY  . Chest pain  . Routine general medical examination at a health care facility  . Gynecological examination  . Bronchitis  . Vitamin d deficiency  . Obesity  . Aortic dissection  . Anemia  . Nausea  . Vitamin D deficiency disease   Past Medical History  Diagnosis Date  . Allergy   . Asthma   . Anxiety   . Depression   . GERD (gastroesophageal reflux disease)   . Hyperlipidemia   . Hypertension   . Osteoporosis   . Gallbladder sludge   . AAA (abdominal aortic aneurysm)     with rupture and surgery  . Marfan syndrome   . RA (rheumatoid arthritis)     per Dr. Mallie Mussel   Past Surgical History  Procedure Date  . Cesarean section 1986 & 1991  . Cardiac catheterization 2001  . Fracture surgery     shoulder replacement  . Orif distal radius fracture   . Abdominal aortic aneurysm repair   . Cholecystectomy    History  Substance Use Topics  . Smoking status: Former Games developer  . Smokeless tobacco: Never Used  . Alcohol  Use: Yes     occassional   Family History  Problem Relation Age of Onset  . Hypertension Mother   . Depression Mother   . Osteoporosis Mother   . Stroke Mother   . Arthritis Sister   . Aneurysm Paternal Grandmother   . Nephrolithiasis Sister   . Osteoporosis Sister    Allergies  Allergen Reactions  . Alendronate Sodium     REACTION: GI  . Aspirin     REACTION: bruising  . Hydrocod Polst-Cpm Polst Er Itching  . Hydrocodone-Acetaminophen     REACTION: itching  . Peanut-Containing Drug Products     REACTION: migraine  . Prednisone     REACTION: rash   Current Outpatient Prescriptions on File Prior to Visit  Medication Sig Dispense Refill  . albuterol (PROVENTIL HFA;VENTOLIN HFA) 108 (90 BASE) MCG/ACT inhaler Inhale 2 puffs into the lungs every 4 (four) hours as needed.  1 Inhaler  3  . amLODipine (NORVASC) 5 MG tablet Take 1 tablet (5 mg total) by mouth 2 (two) times daily.  60 tablet  11   . aspirin 81 MG tablet Take 81 mg by mouth daily.        . benzonatate (TESSALON) 100 MG capsule Take 1 capsule (100 mg total) by mouth 3 (three) times daily as needed for cough.  45 capsule  0  . carvedilol (COREG) 12.5 MG tablet Take 1 tablet (12.5 mg total) by mouth 2 (two) times daily with a meal.  180 tablet  3  . cyclobenzaprine (FLEXERIL) 10 MG tablet Take 1 tablet (10 mg total) by mouth 3 (three) times daily as needed.  90 tablet  0  . FLUoxetine (PROZAC) 20 MG tablet Take 1 tablet (20 mg total) by mouth daily.  30 tablet  5  . omeprazole (PRILOSEC) 40 MG capsule Take 1 capsule (40 mg total) by mouth daily.  30 capsule  6  . pravastatin (PRAVACHOL) 20 MG tablet Take 1 tablet (20 mg total) by mouth daily.  30 tablet  11  . Probiotic Product (PROBIOTIC FORMULA PO) Take 2 tablets by mouth daily.        . promethazine (PHENERGAN) 25 MG tablet Take 25 mg by mouth every 8 (eight) hours as needed.      Marland Kitchen Spacer/Aero-Holding Chambers (POCKET SPACER) DEVI Use with albuterol  1 each  0  . fexofenadine (ALLEGRA) 180 MG tablet Take 180 mg by mouth daily.            Review of Systems Review of Systems  Constitutional: Negative for fever, appetite change,  and unexpected weight change. pos for fatigue  Eyes: Negative for pain and visual disturbance.  Respiratory: Negative for cough and shortness of breath.   Cardiovascular: Negative for cp or palpitations    Gastrointestinal: Negative for nausea, diarrhea and constipation.  Genitourinary: Negative for urgency and frequency.  Skin: Negative for pallor or rash   Neurological: Negative for weakness, light-headedness, numbness and headaches.  Hematological: Negative for adenopathy. Does not bruise/bleed easily.  Psychiatric/Behavioral: Negative for dysphoric mood. The patient is less anxious than she used to be       Objective:   Physical Exam  Constitutional: She appears well-developed and well-nourished. No distress.       overwt and well  appearing   HENT:  Head: Normocephalic and atraumatic.  Right Ear: External ear normal.  Left Ear: External ear normal.  Nose: Nose normal.  Mouth/Throat: Oropharynx is clear and moist.  No cerumen   Eyes: Conjunctivae and EOM are normal. Pupils are equal, round, and reactive to light. Right eye exhibits no discharge. Left eye exhibits no discharge. No scleral icterus.  Neck: Normal range of motion. Neck supple. No JVD present. Carotid bruit is not present. No thyromegaly present.  Cardiovascular: Normal rate, regular rhythm, normal heart sounds and intact distal pulses.  Exam reveals no gallop.   Pulmonary/Chest: Effort normal and breath sounds normal. No respiratory distress. She has no wheezes.  Abdominal: Soft. Bowel sounds are normal. She exhibits no distension, no abdominal bruit and no mass. There is no tenderness.       Scar noted   Musculoskeletal: She exhibits no edema and no tenderness.  Lymphadenopathy:    She has no cervical adenopathy.  Neurological: She is alert. She has normal reflexes. No cranial nerve deficit. She exhibits normal muscle tone. Coordination normal.  Skin: Skin is warm and dry. No rash noted. No erythema. No pallor.  Psychiatric: She has a normal mood and affect.       Very good mood  Cheerful and talkative           Assessment & Plan:

## 2012-07-03 NOTE — Assessment & Plan Note (Signed)
bp in fair control at this time  No changes needed  Disc lifstyle change with low sodium diet and exercise   

## 2012-07-03 NOTE — Assessment & Plan Note (Signed)
Re check this- some component of anemia of chronic dz - and Hb fluctuates  Last 11.9 Takes supplements  Feels ok

## 2012-07-03 NOTE — Assessment & Plan Note (Signed)
Check level today on increased otc supplementation

## 2012-07-03 NOTE — Patient Instructions (Addendum)
To wean prozac take 1/2 tab daily for about 2 weeks and then stop  Labs today  Avoid red meat/ fried foods/ egg yolks/ fatty breakfast meats/ butter, cheese and high fat dairy/ and shellfish   Follow up in 6 months with labs prior for annual exam

## 2012-07-03 NOTE — Assessment & Plan Note (Signed)
Labs last time much better on pravachol  Disc goals for lipids and reasons to control them Rev labs with pt from last time- re check today Rev low sat fat diet in detail

## 2012-07-03 NOTE — Assessment & Plan Note (Signed)
Pt can no longer afford to see psychiatrist Doing well and wants to wean off prozac-disc how to do this Takes 2 xanax a week- disc minimizing this as well  Will continue to follow

## 2012-07-04 LAB — VITAMIN D 25 HYDROXY (VIT D DEFICIENCY, FRACTURES): Vit D, 25-Hydroxy: 42 ng/mL (ref 30–89)

## 2012-07-31 ENCOUNTER — Other Ambulatory Visit: Payer: Self-pay | Admitting: Cardiovascular Disease

## 2012-07-31 MED ORDER — POTASSIUM CHLORIDE ER 10 MEQ PO TBCR: 10.0000 meq | EXTENDED_RELEASE_TABLET | Freq: Every day | ORAL | Status: DC

## 2012-07-31 MED ORDER — FUROSEMIDE 20 MG PO TABS: 20.0000 mg | ORAL_TABLET | Freq: Every day | ORAL | Status: DC

## 2012-07-31 NOTE — Telephone Encounter (Signed)
Pt was notified.  Rx for lasix sent to [facility] pharmacy.  Rx for KCL not sent to pharmacy since pt states she has some at home.

## 2012-07-31 NOTE — Telephone Encounter (Signed)
Mrs Varney Biles is calling because she has noticed increased swelling in bil ankles/feet.   She states she has had the increased swelling x 1-2 weeks but states it has gotten worse over the weekend.  She states she does not take any diuretics.  She elevated her legs last night and the swelling is better but not gone.  She also states her weight is up about 2# since Friday.  She could see her ankle bones this am when she got up but wasn't able to see them yesterday. SOB is not any worse than normal.

## 2012-07-31 NOTE — Telephone Encounter (Signed)
We could start Lasix 20 mg when necessary with potassium (KCL) 10 mEq with Lasix Would decrease salt and fluid intake If no improvement, swelling could be secondary to amlodipine  If it is from amlodipine, we would decrease the dose to 5 mg daily Close monitoring of blood pressure

## 2012-07-31 NOTE — Telephone Encounter (Signed)
Pt calling states that she is having increased swelling in her feet. Wants to know what she should do.

## 2012-08-04 ENCOUNTER — Telehealth: Payer: Self-pay | Admitting: Cardiovascular Disease

## 2012-08-04 NOTE — Telephone Encounter (Signed)
Pt wants to know if she is ok to fly.

## 2012-08-04 NOTE — Telephone Encounter (Signed)
Pt aware Dr. [name] reviewed and okay to fly. Pt is going to [facility] soon to see her son. Mylo Red RN

## 2012-08-07 ENCOUNTER — Telehealth: Payer: Self-pay | Admitting: *Deleted

## 2012-08-07 NOTE — Telephone Encounter (Signed)
Pt reports worsening DOE, LE edema and abdominal distension.  She has been taking lasix 20 mg qd and has been taking potassium supplement (OTC). She says she does not have KCL tablets.  BP=128/66, 127/70. I advised ok to take an extra lasix (40 mg ) x 2 days and see if this helps symptoms. She asks that I discuss with Dr. [name] first and call her back. I told her I would do this.

## 2012-08-07 NOTE — Telephone Encounter (Signed)
Pt informed Understanding verb 

## 2012-08-07 NOTE — Telephone Encounter (Signed)
Discussed with Dr. [name], who suggests she "take 40 mg lasix daily x 2 days, should take with OTC potassium.  If edema/symptoms do not improve, there may be another issue such as vascular insufficiency, etc.  She should call us back in 2 days to report." V.O Dr. Alvis Lemmings, RN

## 2012-08-07 NOTE — Telephone Encounter (Signed)
Spoke with pt today and she mentioned that she is still retaining fluid. She was put on lasix to help with edema that she was having before in ankles she is up 3 lbs in two days and continues to have edema in ankles/abdomen and has some chest discomfort. She is continuing to not feel well today and would like to know what she should do?

## 2012-08-09 ENCOUNTER — Telehealth: Payer: Self-pay | Admitting: Cardiovascular Disease

## 2012-08-09 ENCOUNTER — Other Ambulatory Visit (INDEPENDENT_AMBULATORY_CARE_PROVIDER_SITE_OTHER): Payer: Self-pay

## 2012-08-09 DIAGNOSIS — R609 Edema, unspecified: Secondary | ICD-10-CM

## 2012-08-09 NOTE — Telephone Encounter (Signed)
Pt called stating that she is still having a lot of fluid retention in the mid section and ankles and knees. Wants to know what she should do.

## 2012-08-09 NOTE — Telephone Encounter (Signed)
Pt is scheduled for 08-15-12 for echo. Her Medicare starts 08-20-12. Pt had labs today. She thought you might be able to advise based just on her labwork.   Pt would like to know if can wait for echo until after 08-20-12 when her Medicare starts or keep appt on 08-15-12.  Regardless of when Medicare starts she want to do what is best medically for her.  I am mailing her financial aid package for pt to fill out to see if she can qualify for [facility] financial aid.  Please advise.

## 2012-08-09 NOTE — Telephone Encounter (Signed)
Discussed with Dr. [name], who suggests having pt come in for echo (if not done recently) and have pt come in for BNP and BMP.   Last echo was January 2012. I called pt to inform. Echo scheduled for next Tuesday 08/15/12 at 1230 and she is coming in today for labs. I explained, based on results of labs and echo, we will know further how to proceed. Understanding verb.

## 2012-08-09 NOTE — Telephone Encounter (Signed)
Would push echo back Meanwhile will watch for blood work

## 2012-08-10 ENCOUNTER — Other Ambulatory Visit: Payer: Self-pay

## 2012-08-10 DIAGNOSIS — I10 Essential (primary) hypertension: Secondary | ICD-10-CM

## 2012-08-10 LAB — BASIC METABOLIC PANEL
BUN/Creatinine Ratio: 15 (ref 9–23)
BUN: 14 mg/dL (ref 6–24)
CO2: 22 mmol/L (ref 19–28)
Calcium: 9.3 mg/dL (ref 8.7–10.2)
Chloride: 101 mmol/L (ref 97–108)
Creatinine, Ser: 0.93 mg/dL (ref 0.57–1.00)
GFR calc Af Amer: 81 mL/min/{1.73_m2} (ref >59)
GFR calc non Af Amer: 70 mL/min/{1.73_m2} (ref >59)
Glucose: 99 mg/dL (ref 65–99)
Potassium: 4 mmol/L (ref 3.5–5.2)
Sodium: 139 mmol/L (ref 134–144)

## 2012-08-10 LAB — BRAIN NATRIURETIC PEPTIDE: BNP: 45 pg/mL (ref 0.0–100.0)

## 2012-08-10 MED ORDER — LISINOPRIL 20 MG PO TABS: 20.0000 mg | ORAL_TABLET | Freq: Every day | ORAL | Status: DC

## 2012-08-10 NOTE — Telephone Encounter (Signed)
Pt informed. Understanding verb. Will cancel 8/27 echo. She will call us back once she gets financial aid info and will know when she can schedule echo

## 2012-08-15 ENCOUNTER — Other Ambulatory Visit: Payer: Self-pay

## 2012-08-16 ENCOUNTER — Telehealth: Payer: Self-pay | Admitting: Family Medicine

## 2012-08-16 NOTE — Telephone Encounter (Signed)
Noted  

## 2012-08-16 NOTE — Telephone Encounter (Signed)
Caller: [name]/Patient; Patient Name: Rachel Torres; PCP: Roxy Manns [facility]); Best Callback Phone Number: [phone].  Pt. calls reporting vertigo (dizzy with any head movement) for 1 week.  Afebrile.  Denies cardiac symptoms.  Triaged with Dizziness or Vertigo and all emergent symptoms ruled out with exception to:  Symptoms worsen with movement of head or looking up and not previously evaluated.  Disposition:  See Provider within 24 hours.  Unable to get appointment with Dr. Milinda Antis.  Pt. has seen Dr. Para March before.  Appoinment scheduled at 12:15 08/17/12 with Dr. Para March.  Home Care instructions given.   CAN/db

## 2012-08-17 ENCOUNTER — Ambulatory Visit (INDEPENDENT_AMBULATORY_CARE_PROVIDER_SITE_OTHER): Payer: Self-pay | Admitting: Family Medicine

## 2012-08-17 ENCOUNTER — Encounter: Payer: Self-pay | Admitting: Family Medicine

## 2012-08-17 VITALS — BP 104/70 | HR 72 | Temp 98.5°F | Wt 207.8 lb

## 2012-08-17 DIAGNOSIS — R42 Dizziness and giddiness: Secondary | ICD-10-CM

## 2012-08-17 MED ORDER — MECLIZINE HCL 25 MG PO TABS: 12.5000 mg | ORAL_TABLET | Freq: Three times a day (TID) | ORAL | Status: AC | PRN

## 2012-08-17 NOTE — Progress Notes (Signed)
She hadn't felt well for the last few weeks.  Dr. [name] changed to BP meds to treat edema last week; her dizzy sensation started a few days later (not immediately).  She had similar sx years ago with an ear infection per patient.  The swelling in the legs is better in interval.  She is on modest fluid restriction.  Weight stable between 208-211.  Echo is pending.   Meds, vitals, and allergies reviewed.   ROS: See HPI.  Otherwise, noncontributory.  nad ncat Tm wnl Nasal and OP exam wnl Neck supple Rrr, no rub ctab Ext w/o edema CN 2-12 wnl B, S/S/DTR wnl x4 DHP positive

## 2012-08-17 NOTE — Patient Instructions (Addendum)
Take meclizine 12.5-25mg  up to 3 times a day for vertigo.  Don't take it with allegra or promethazine.  I would take 1 lasix a day unless your weight/swelling gets worse.  I'll check with Dr. [name] about the echo.

## 2012-08-18 ENCOUNTER — Ambulatory Visit: Payer: Self-pay | Admitting: Family Medicine

## 2012-08-18 DIAGNOSIS — R42 Dizziness and giddiness: Secondary | ICD-10-CM | POA: Insufficient documentation

## 2012-08-18 NOTE — Assessment & Plan Note (Signed)
+  DHP but no orthostatic sx.  Likely BPV.  Would use OTC meclizine and f/u prn.  I don't think this is cardiac related.  Will await echo per cards.

## 2012-09-11 ENCOUNTER — Ambulatory Visit (INDEPENDENT_AMBULATORY_CARE_PROVIDER_SITE_OTHER): Payer: Medicaid Other | Admitting: Family Medicine

## 2012-09-11 ENCOUNTER — Encounter: Payer: Self-pay | Admitting: Family Medicine

## 2012-09-11 VITALS — BP 122/74 | HR 66 | Temp 97.8°F | Ht 65.0 in | Wt 208.5 lb

## 2012-09-11 DIAGNOSIS — R0789 Other chest pain: Secondary | ICD-10-CM

## 2012-09-11 DIAGNOSIS — I10 Essential (primary) hypertension: Secondary | ICD-10-CM

## 2012-09-11 DIAGNOSIS — Z23 Encounter for immunization: Secondary | ICD-10-CM

## 2012-09-11 NOTE — Assessment & Plan Note (Signed)
Ongoing with hosp several times Stable today Rev hosp records/ echo/ cxr For cardiol f/u ? If vasospasm/ and stressors play a role

## 2012-09-11 NOTE — Patient Instructions (Addendum)
Blood pressure is good today Stay on the 5 mg of lisopril Follow up with Dr [name] as planned Flu shot today  Keep working on low fat and low sodium diet

## 2012-09-11 NOTE — Assessment & Plan Note (Signed)
bp stable on 5 of lisinopril Rev hosp records F/u Dr [name] for ongoing cp on wed

## 2012-09-11 NOTE — Progress Notes (Signed)
Subjective:    Patient ID: Rachel Torres, female    DOB: [birth year], 56 y.o.   MRN: [id number]  HPI Here for f/u of HTN after hosp at [facility] on 9/20 Was in hodp for sob/ cp and edema  Dx with stable angina vs microvasc dz Lisinopril was dec to 5 mg at that time  cxr and echo were stable  bnp was up at 244  To f/u  With her cardiologist She has basically had cp since her AAA and bypass   bp is stable today  No cp or palpitations or headaches or edema  No side effects to medicines  BP Readings from Last 3 Encounters:  09/11/12 122/74  08/17/12 104/70  07/03/12 110/68    bp at home are great - 110s-120s/60-70s  She is distressed because she still does not have insurance  Cannot afford her medical bills   Pt states she is still short of breath all the time   Has appt with Dr [name] on Wednesday  Patient Active Problem List  Diagnosis  . HYPERLIPIDEMIA  . PERSISTENT DISORDER INITIATING/MAINTAINING SLEEP  . Depression with anxiety  . HYPERTENSION  . CORONARY ARTERY SPASM  . DYSRHYTHMIA, CARDIAC NOS  . EXTERNAL HEMORRHOIDS, WITH COMPLICATION  . ALLERGIC RHINITIS  . ASTHMA  . GERD  . IRRITABLE BOWEL SYNDROME  . OSTEOARTHRITIS  . OSTEOPOROSIS  . INSOMNIA  . SLEEP APNEA  . MURMUR  . MIGRAINES, HX OF  . POSTMENOPAUSAL STATUS  . AAA  . GALLBLADDER DISEASE  . BACK PAIN  . AORTIC VALVE REPLACEMENT, HX OF  . CORONARY ARTERY BYPASS GRAFT, HX OF  . UNSPECIFIED ANEMIA  . DIZZINESS  . CARDIOMYOPATHY  . Chest pain  . Routine general medical examination at a health care facility  . Gynecological examination  . Bronchitis  . Vitamin d deficiency  . Obesity  . Aortic dissection  . Anemia  . Nausea  . Vitamin D deficiency disease  . Vertigo   Past Medical History  Diagnosis Date  . Allergy   . Asthma   . Anxiety   . Depression   . GERD (gastroesophageal reflux disease)   . Hyperlipidemia   . Hypertension   . Osteoporosis   . Gallbladder sludge   . AAA  (abdominal aortic aneurysm)     with rupture and surgery  . Marfan syndrome   . RA (rheumatoid arthritis)     per Dr. Mallie Mussel   Past Surgical History  Procedure Date  . Cesarean section 1986 & 1991  . Cardiac catheterization 2001  . Fracture surgery     shoulder replacement  . Orif distal radius fracture   . Abdominal aortic aneurysm repair   . Cholecystectomy    History  Substance Use Topics  . Smoking status: Former Games developer  . Smokeless tobacco: Never Used  . Alcohol Use: Yes     occassional   Family History  Problem Relation Age of Onset  . Hypertension Mother   . Depression Mother   . Osteoporosis Mother   . Stroke Mother   . Arthritis Sister   . Aneurysm Paternal Grandmother   . Nephrolithiasis Sister   . Osteoporosis Sister    Allergies  Allergen Reactions  . Alendronate Sodium     REACTION: GI  . Aspirin     REACTION: bruising  . Hydrocod Polst-Cpm Polst Er Itching  . Hydrocodone-Acetaminophen     REACTION: itching  . Peanut-Containing Drug Products     REACTION: migraine  .  Prednisone     REACTION: rash   Current Outpatient Prescriptions on File Prior to Visit  Medication Sig Dispense Refill  . albuterol (PROVENTIL HFA;VENTOLIN HFA) 108 (90 BASE) MCG/ACT inhaler Inhale 2 puffs into the lungs every 4 (four) hours as needed.  1 Inhaler  3  . ALPRAZolam (XANAX) 1 MG tablet Take 1 tablet (1 mg total) by mouth 2 (two) times daily as needed for sleep or anxiety. 1-2tabs by mouth twice a day as needed for anxiety, careful of sedation  30 tablet  0  . aspirin 81 MG tablet Take 81 mg by mouth daily.        . benzonatate (TESSALON) 100 MG capsule Take 1 capsule (100 mg total) by mouth 3 (three) times daily as needed for cough.  45 capsule  0  . carvedilol (COREG) 12.5 MG tablet Take 1 tablet (12.5 mg total) by mouth 2 (two) times daily with a meal.  180 tablet  3  . cyclobenzaprine (FLEXERIL) 10 MG tablet Take 1 tablet (10 mg total) by mouth 3 (three) times daily  as needed.  90 tablet  0  . furosemide (LASIX) 20 MG tablet Take 1 tablet (20 mg total) by mouth daily. As needed for swelling  30 tablet  6  . omeprazole (PRILOSEC) 40 MG capsule Take 1 capsule (40 mg total) by mouth daily.  30 capsule  6  . Potassium 99 MG TABS Take 1 tablet by mouth daily.      . pravastatin (PRAVACHOL) 20 MG tablet Take 1 tablet (20 mg total) by mouth daily.  30 tablet  11  . Probiotic Product (PROBIOTIC FORMULA PO) Take 2 tablets by mouth daily.        . promethazine (PHENERGAN) 25 MG tablet Take 25 mg by mouth every 8 (eight) hours as needed.      Marland Kitchen Spacer/Aero-Holding Chambers (POCKET SPACER) DEVI Use with albuterol  1 each  0  . traMADol (ULTRAM) 50 MG tablet 1-2 tablets by mouth up to every 6 hours as needed for pain  60 tablet  3        Review of Systems Review of Systems  Constitutional: Negative for fever, appetite change, fatigue and unexpected weight change.  Eyes: Negative for pain and visual disturbance.  Respiratory: Negative for cough and pos for sob on any exertion at all, which disables her Cardiovascular: pos for chronic chest pain which is not exertional, neg for palpitations , neg for edema today    Gastrointestinal: Negative for nausea, diarrhea and constipation.  Genitourinary: Negative for urgency and frequency.  Skin: Negative for pallor or rash   Neurological: Negative for weakness, light-headedness, numbness and headaches.  Hematological: Negative for adenopathy. Does not bruise/bleed easily.  Psychiatric/Behavioral: Negative for dysphoric mood. The patient is not nervous/anxious.  pos for severe stressors incl inability to pay her medical bills       Objective:   Physical Exam  Constitutional: She appears well-developed and well-nourished. No distress.       obese and well appearing   HENT:  Head: Normocephalic and atraumatic.  Mouth/Throat: Oropharynx is clear and moist.  Eyes: Conjunctivae normal and EOM are normal. Pupils are  equal, round, and reactive to light. No scleral icterus.  Neck: Normal range of motion. Neck supple. No JVD present. No thyromegaly present.  Cardiovascular: Normal rate, regular rhythm, normal heart sounds and intact distal pulses.  Exam reveals no gallop.   Pulmonary/Chest: Effort normal and breath sounds normal. No respiratory distress.  She has no wheezes. She has no rales. She exhibits no tenderness.  Abdominal: Soft. Bowel sounds are normal. She exhibits no distension and no mass. There is no tenderness.  Musculoskeletal: She exhibits no edema.  Lymphadenopathy:    She has no cervical adenopathy.  Neurological: She is alert. She has normal reflexes. She displays no tremor. No cranial nerve deficit. She exhibits normal muscle tone. Coordination normal.  Skin: Skin is warm and dry. No rash noted. No erythema. No pallor.  Psychiatric: Her behavior is normal. Thought content normal. Her mood appears anxious. Her affect is not labile and not inappropriate. Her speech is tangential. Cognition and memory are normal. She expresses no homicidal and no suicidal ideation.       Pt is distressed and tearful throughout interview Indicates that she is caught in the middle without insurance and cannot work due to chronic pain that cannot be dx or controlled  Difficult to communicate with her today  Also states she cannot afford counseling at this time She does have a supportive friend with her          Assessment & Plan:

## 2012-09-13 ENCOUNTER — Encounter: Payer: Self-pay | Admitting: Cardiovascular Disease

## 2012-09-13 ENCOUNTER — Ambulatory Visit (INDEPENDENT_AMBULATORY_CARE_PROVIDER_SITE_OTHER): Payer: Medicaid Other | Admitting: Cardiovascular Disease

## 2012-09-13 ENCOUNTER — Encounter: Payer: Self-pay | Admitting: Gastroenterology

## 2012-09-13 VITALS — BP 122/78 | HR 70 | Ht 65.0 in | Wt 210.0 lb

## 2012-09-13 DIAGNOSIS — R079 Chest pain, unspecified: Secondary | ICD-10-CM

## 2012-09-13 DIAGNOSIS — R0602 Shortness of breath: Secondary | ICD-10-CM

## 2012-09-13 DIAGNOSIS — Z954 Presence of other heart-valve replacement: Secondary | ICD-10-CM

## 2012-09-13 DIAGNOSIS — I10 Essential (primary) hypertension: Secondary | ICD-10-CM

## 2012-09-13 DIAGNOSIS — I7100 Dissection of unspecified site of aorta: Secondary | ICD-10-CM

## 2012-09-13 DIAGNOSIS — Z951 Presence of aortocoronary bypass graft: Secondary | ICD-10-CM

## 2012-09-13 DIAGNOSIS — I428 Other cardiomyopathies: Secondary | ICD-10-CM

## 2012-09-13 DIAGNOSIS — R0789 Other chest pain: Secondary | ICD-10-CM

## 2012-09-13 NOTE — Assessment & Plan Note (Signed)
Well-seated aortic valve on recent echocardiogram.

## 2012-09-13 NOTE — Assessment & Plan Note (Signed)
We will continue low-dose ACE inhibitor, beta blocker as well as Lasix. Blood pressures in the 115-120 range

## 2012-09-13 NOTE — Assessment & Plan Note (Signed)
Stable aorta based on recent CT scan done at [facility]

## 2012-09-13 NOTE — Patient Instructions (Addendum)
You are doing well. No medication changes were made.  Please call us if you have new issues that need to be addressed before your next appt.  Your physician wants you to follow-up in: 6 months.  You will receive a reminder letter in the mail two months in advance. If you don't receive a letter, please call our office to schedule the follow-up appointment.  An appointment has been made for you with Dr. Arlyce Dice at [facility] 10/11/12 at 8:30 am. Please arrive at 8:15 am. Call their office if you need to reschedule or change appointment at [phone].

## 2012-09-13 NOTE — Assessment & Plan Note (Signed)
Improved ejection fraction on recent echocardiogram at [facility]. Ejection fraction 45-50%. She will stay on Lasix, as well as her other outpatient medications.

## 2012-09-13 NOTE — Assessment & Plan Note (Signed)
Atypical chest and abdominal symptoms. Previous GI evaluation at [facility] for biliary issues. She is requesting referral to GI for additional GI workup. She is on a proton pump inhibitor with no relief of her symptoms. She is concerned about family history of GERD and Barrett's.

## 2012-09-13 NOTE — Assessment & Plan Note (Signed)
Negative stress test at [facility] this past month. No further workup at this time.

## 2012-09-13 NOTE — Progress Notes (Signed)
Patient ID: Rachel Torres, female    DOB: 1956-05-29, 56 y.o.   MRN: [id number]  HPI Comments: [name] is a very pleasant 56 year old woman who suffered an aorta type A dissection in September 2011 who was rushed to [facility] and underwent aortic valve replacement, bypass of the right coronary artery with venous donor site from her right thigh, aortic grafting for a extensive dissection of the  ascending aorta, residual descending aortic dissection extending into the iliac artery, with chronic back pain, previous  symptoms of shortness of breath, abdominal pain, hypertension and malaise.   In the past she has been very active. She is able to go dancing, is dating. Currently lives with her mother. She is on Medicaid and has lost contact with her primary care physician and is looking for a new PMD.  Recent evaluation at [facility] for shortness of breath and chest discomfort. She had workup for cardiac issues including echocardiogram and PET stress, CTA of the chest showing stable descending aorta repair with type B. aortic dissection with opacification of the false lumen at the level of the renal arteries via a lumbar artery. At the time of discharge from [name], they decreased her lisinopril to 5 mg daily as her blood pressure was low. Since then blood pressure has been in the 115-120 range. She does continue to report chest and abdominal discomfort she is very concerned about GERD given her negative cardiac workup. She reports her mother has significant GI history, possible Barrett's. The patient has been on omeprazole for a long time with no relief of her symptoms. She is requesting referral to GI for further evaluation. She does not have a primary care physician at this time.  Echocardiogram showed ejection fraction had improved to 45-50%, mild MR, TR (ejection fraction improved from 55% in January 2012)  Previous H/o abdominal pain, further workup showing biliary stones and sludge. s/p   laparoscopic cholecystectomy at [facility].  She takes Lasix for urinary retention. She denies any significant lower extremity edema   stress test in 2012 did not show ischemia in the anterior wall.  Per the notes from [facility], prelim result of her stress test showed no ischemia   EKG shows normal sinus rhythm with rate 70 beats per minute, first degree AV block   Outpatient Encounter Prescriptions as of 09/13/2012  Medication Sig Dispense Refill  . albuterol (PROVENTIL HFA;VENTOLIN HFA) 108 (90 BASE) MCG/ACT inhaler Inhale 2 puffs into the lungs every 4 (four) hours as needed.  1 Inhaler  3  . ALPRAZolam (XANAX) 1 MG tablet Take 1 tablet (1 mg total) by mouth 2 (two) times daily as needed for sleep or anxiety. 1-2tabs by mouth twice a day as needed for anxiety, careful of sedation  30 tablet  0  . aspirin 81 MG tablet Take 81 mg by mouth daily.        . benzonatate (TESSALON) 100 MG capsule Take 1 capsule (100 mg total) by mouth 3 (three) times daily as needed for cough.  45 capsule  0  . carvedilol (COREG) 12.5 MG tablet Take 1 tablet (12.5 mg total) by mouth 2 (two) times daily with a meal.  180 tablet  3  . cyclobenzaprine (FLEXERIL) 10 MG tablet Take 1 tablet (10 mg total) by mouth 3 (three) times daily as needed.  90 tablet  0  . furosemide (LASIX) 20 MG tablet Take 1 tablet (20 mg total) by mouth daily. As needed for swelling  30 tablet  6  .  lisinopril (PRINIVIL,ZESTRIL) 5 MG tablet Take 5 mg by mouth daily.      . meclizine (ANTIVERT) 12.5 MG tablet Take 12.5 mg by mouth as needed.      . Misc Natural Products (BLACK CHERRY CONCENTRATE PO) Take 1 tablet by mouth 2 (two) times daily.      Marland Kitchen omeprazole (PRILOSEC) 40 MG capsule Take 1 capsule (40 mg total) by mouth daily.  30 capsule  6  . Potassium 99 MG TABS Take 1 tablet by mouth daily.      . pravastatin (PRAVACHOL) 20 MG tablet Take 1 tablet (20 mg total) by mouth daily.  30 tablet  11  . Probiotic Product (PROBIOTIC FORMULA PO) Take 2 tablets  by mouth daily.        . promethazine (PHENERGAN) 25 MG tablet Take 25 mg by mouth every 8 (eight) hours as needed.      Marland Kitchen Spacer/Aero-Holding Chambers (POCKET SPACER) DEVI Use with albuterol  1 each  0  . traMADol (ULTRAM) 50 MG tablet 1-2 tablets by mouth up to every 6 hours as needed for pain  60 tablet  3    Review of Systems  Constitutional: Negative.   HENT: Negative.   Eyes: Negative.   Cardiovascular: Positive for chest pain.  Gastrointestinal: Positive for abdominal pain.  Musculoskeletal: Negative.   Skin: Negative.   Neurological: Negative.        Memory problems.  Hematological: Negative.   Psychiatric/Behavioral: Negative.   All other systems reviewed and are negative.   BP 122/78  Pulse 70  Ht 5\' 5"  (1.651 m)  Wt 210 lb (95.255 kg)  BMI 34.95 kg/m2  Physical Exam  Nursing note and vitals reviewed. Constitutional: She is oriented to person, place, and time. She appears well-developed and well-nourished.  HENT:  Head: Normocephalic.  Nose: Nose normal.  Mouth/Throat: Oropharynx is clear and moist.  Eyes: Conjunctivae normal are normal. Pupils are equal, round, and reactive to light.  Neck: Normal range of motion. Neck supple. No JVD present.  Cardiovascular: Normal rate, regular rhythm, S1 normal, S2 normal and intact distal pulses.  Exam reveals no gallop and no friction rub.   Murmur heard.  Crescendo systolic murmur is present with a grade of 2/6       Decreased right radial pulse, left lower extremity pulses  Pulmonary/Chest: Effort normal and breath sounds normal. No respiratory distress. She has no wheezes. She has no rales. She exhibits no tenderness.       Well-healed mediastinal scar  Abdominal: Soft. Bowel sounds are normal. She exhibits no distension. There is no tenderness.  Musculoskeletal: Normal range of motion. She exhibits no edema and no tenderness.  Lymphadenopathy:    She has no cervical adenopathy.  Neurological: She is alert and oriented  to person, place, and time. Coordination normal.  Skin: Skin is warm and dry. No rash noted. No erythema.  Psychiatric: She has a normal mood and affect. Her behavior is normal. Judgment and thought content normal.         Assessment and Plan

## 2012-10-03 ENCOUNTER — Encounter: Payer: Self-pay | Admitting: Gastroenterology

## 2012-10-11 ENCOUNTER — Ambulatory Visit: Payer: Self-pay | Admitting: Gastroenterology

## 2012-10-26 ENCOUNTER — Telehealth: Payer: Self-pay | Admitting: Cardiovascular Disease

## 2012-10-26 MED ORDER — LISINOPRIL 5 MG PO TABS: 5.0000 mg | ORAL_TABLET | Freq: Every day | ORAL | Status: DC

## 2012-10-26 MED ORDER — CARVEDILOL 12.5 MG PO TABS: 12.5000 mg | ORAL_TABLET | Freq: Two times a day (BID) | ORAL | Status: DC

## 2012-10-26 NOTE — Telephone Encounter (Signed)
Pt just needs refills of lisinopril and coreg Refills sent to pharm

## 2012-10-26 NOTE — Telephone Encounter (Signed)
Pt called stating that she is confused as to what medication she is to take

## 2012-12-07 ENCOUNTER — Other Ambulatory Visit: Payer: Self-pay | Admitting: *Deleted

## 2012-12-07 MED ORDER — OMEPRAZOLE 40 MG PO CPDR: 40.0000 mg | DELAYED_RELEASE_CAPSULE | Freq: Every day | ORAL | Status: DC

## 2012-12-11 ENCOUNTER — Other Ambulatory Visit: Payer: Self-pay | Admitting: *Deleted

## 2012-12-11 MED ORDER — ALPRAZOLAM 1 MG PO TABS: 1.0000 mg | ORAL_TABLET | Freq: Two times a day (BID) | ORAL | Status: DC | PRN

## 2012-12-11 NOTE — Telephone Encounter (Signed)
Px written for call in   

## 2012-12-11 NOTE — Telephone Encounter (Signed)
Rx called in as prescribed 

## 2012-12-26 ENCOUNTER — Telehealth: Payer: Self-pay | Admitting: Family Medicine

## 2012-12-26 DIAGNOSIS — Z0000 Encounter for general adult medical examination without abnormal findings: Secondary | ICD-10-CM

## 2012-12-26 DIAGNOSIS — M810 Age-related osteoporosis without current pathological fracture: Secondary | ICD-10-CM

## 2012-12-26 DIAGNOSIS — E785 Hyperlipidemia, unspecified: Secondary | ICD-10-CM

## 2012-12-26 DIAGNOSIS — E559 Vitamin D deficiency, unspecified: Secondary | ICD-10-CM

## 2012-12-26 NOTE — Telephone Encounter (Signed)
Message copied by [name] on Tue Dec 26, 2012  9:22 PM ------      Message from: Baldomero Lamy      Created: Mon Dec 25, 2012 12:53 PM      Regarding: Cpx labs Wed 12/27/12       Please order  future cpx labs for pt's upcoming lab appt.      Thanks      Rodney Booze

## 2012-12-27 ENCOUNTER — Other Ambulatory Visit: Payer: Self-pay

## 2013-01-03 ENCOUNTER — Encounter: Payer: Self-pay | Admitting: Family Medicine

## 2013-01-15 ENCOUNTER — Telehealth: Payer: Self-pay

## 2013-01-15 DIAGNOSIS — Z1231 Encounter for screening mammogram for malignant neoplasm of breast: Secondary | ICD-10-CM

## 2013-01-15 NOTE — Telephone Encounter (Signed)
Order completed

## 2013-01-15 NOTE — Telephone Encounter (Signed)
Rachel Torres at [facility] left v/m requesting order for screening mammogram; pt scheduled appt but is on medicaid so order is required for screening mammogram; pt scheduled 01/16/13; please fax order.

## 2013-01-16 NOTE — Telephone Encounter (Signed)
MMG order faxed yo [facility].

## 2013-01-19 LAB — HM COLONOSCOPY

## 2013-01-22 ENCOUNTER — Other Ambulatory Visit: Payer: Self-pay | Admitting: *Deleted

## 2013-01-22 MED ORDER — ALPRAZOLAM 1 MG PO TABS: 1.0000 mg | ORAL_TABLET | Freq: Two times a day (BID) | ORAL | Status: DC | PRN

## 2013-01-22 MED ORDER — BENZONATATE 100 MG PO CAPS: 100.0000 mg | ORAL_CAPSULE | Freq: Three times a day (TID) | ORAL | Status: DC | PRN

## 2013-01-22 NOTE — Telephone Encounter (Signed)
Px written for call in   

## 2013-01-22 NOTE — Telephone Encounter (Signed)
Received fax refill request, ok to refill  

## 2013-01-22 NOTE — Telephone Encounter (Signed)
Rxs called in as prescribed  

## 2013-01-22 NOTE — Telephone Encounter (Signed)
Received fax refill request for benzonatate also, ok to refill?

## 2013-02-06 ENCOUNTER — Ambulatory Visit: Payer: Self-pay | Admitting: Family Medicine

## 2013-02-06 LAB — HM MAMMOGRAPHY: HM Mammogram: NORMAL

## 2013-02-08 ENCOUNTER — Encounter: Payer: Self-pay | Admitting: Family Medicine

## 2013-02-09 ENCOUNTER — Encounter: Payer: Self-pay | Admitting: Family Medicine

## 2013-02-12 ENCOUNTER — Ambulatory Visit: Payer: Medicaid Other | Admitting: Family Medicine

## 2013-03-08 ENCOUNTER — Other Ambulatory Visit: Payer: Self-pay | Admitting: Family Medicine

## 2013-03-08 MED ORDER — ALPRAZOLAM 1 MG PO TABS: 1.0000 mg | ORAL_TABLET | Freq: Two times a day (BID) | ORAL | Status: DC | PRN

## 2013-03-08 NOTE — Telephone Encounter (Signed)
Px written for call in   

## 2013-03-08 NOTE — Telephone Encounter (Signed)
Received fax refill request, ok to refill  

## 2013-03-09 MED ORDER — OMEPRAZOLE 40 MG PO CPDR: 40.0000 mg | DELAYED_RELEASE_CAPSULE | Freq: Every day | ORAL | Status: DC

## 2013-03-09 NOTE — Telephone Encounter (Signed)
Rx called in as prescribed 

## 2013-03-27 DIAGNOSIS — H905 Unspecified sensorineural hearing loss: Secondary | ICD-10-CM | POA: Insufficient documentation

## 2013-04-05 ENCOUNTER — Telehealth: Payer: Self-pay | Admitting: *Deleted

## 2013-04-05 NOTE — Telephone Encounter (Signed)
Pt calling states her BP is running low and would like to speak to nurse.

## 2013-04-06 NOTE — Telephone Encounter (Signed)
Will call to assess pt on monday

## 2013-04-06 NOTE — Telephone Encounter (Signed)
Pt says BP was low yesterday and she was feeling very lethargic BP was 90/50 Says she called on call MD last night (I do not see any telephone notes DG:[id number]) and was advised to hold "blood pressure medications", so she held lisinopril and coreg BP this am=122/65 She asks what to do with BP meds now/over weekend I advised continue to hold lisinopril but continue coreg BID and if over w/e BP is still low she can try decreasing coreg dose by half. I will call and check on her on Monday 04/09/13 She denies nausea, vomiting or diarrhea and confirms she is drinking enough fluid I will also make Dr. [name] aware

## 2013-04-06 NOTE — Telephone Encounter (Signed)
Agree would start carvedilol twice a day If blood pressure stable through the weekend, could restart lisinopril

## 2013-04-09 ENCOUNTER — Telehealth: Payer: Self-pay

## 2013-04-09 NOTE — Telephone Encounter (Signed)
BP

## 2013-04-09 NOTE — Telephone Encounter (Signed)
I called pt to assess BP since holding lisinopril She says her BP has been normal (112/60) throughout w/e She continues to take carvedilol 1 tablet BID She continues to hold lisinopril I advised ok to resume lisinopril at 1/2 tablet daily to see if BP tolerates.  She will continue to monitor BP and let us know should this drop again

## 2013-04-09 NOTE — Telephone Encounter (Signed)
FYI

## 2013-04-12 ENCOUNTER — Encounter: Payer: Self-pay | Admitting: *Deleted

## 2013-04-12 NOTE — Telephone Encounter (Signed)
This encounter was created in error - please disregard.

## 2013-04-20 ENCOUNTER — Other Ambulatory Visit: Payer: Self-pay

## 2013-04-20 MED ORDER — FUROSEMIDE 20 MG PO TABS: 20.0000 mg | ORAL_TABLET | Freq: Every day | ORAL | Status: DC

## 2013-04-20 NOTE — Telephone Encounter (Signed)
Refill sent for Furosemide  

## 2013-04-24 ENCOUNTER — Ambulatory Visit (INDEPENDENT_AMBULATORY_CARE_PROVIDER_SITE_OTHER): Payer: Medicare PPO | Admitting: Cardiovascular Disease

## 2013-04-24 ENCOUNTER — Encounter: Payer: Self-pay | Admitting: Cardiovascular Disease

## 2013-04-24 VITALS — BP 110/70 | HR 73 | Ht 65.0 in | Wt 196.8 lb

## 2013-04-24 DIAGNOSIS — M549 Dorsalgia, unspecified: Secondary | ICD-10-CM

## 2013-04-24 DIAGNOSIS — E669 Obesity, unspecified: Secondary | ICD-10-CM

## 2013-04-24 DIAGNOSIS — I7100 Dissection of unspecified site of aorta: Secondary | ICD-10-CM

## 2013-04-24 DIAGNOSIS — R252 Cramp and spasm: Secondary | ICD-10-CM

## 2013-04-24 DIAGNOSIS — Z954 Presence of other heart-valve replacement: Secondary | ICD-10-CM

## 2013-04-24 DIAGNOSIS — I10 Essential (primary) hypertension: Secondary | ICD-10-CM

## 2013-04-24 DIAGNOSIS — Z951 Presence of aortocoronary bypass graft: Secondary | ICD-10-CM

## 2013-04-24 NOTE — Progress Notes (Signed)
Patient ID: Rachel Torres, female    DOB: October 09, 1956, 57 y.o.   MRN: [id number]  HPI Comments: Rachel Torres is a very pleasant 57 year-old woman who suffered an aorta type A dissection in September 2011 who was rushed to [facility] and underwent aortic valve replacement, bypass of the right coronary artery with venous donor site from her right thigh, aortic grafting for a extensive dissection of the  ascending aorta, residual descending aortic dissection extending into the iliac artery, with chronic back pain, previous  symptoms of shortness of breath, abdominal pain, hypertension and malaise.   Currently lives with her mother. Overall doing well. Plans on getting married next month. Reports she was seen at [facility] earlier in the year with repeat scan showing no significant progression of her disease. Continues to have chronic back pain. Scheduled to start rehabilitation for her back in the next month. Blood pressure sometimes runs low and she hold her lisinopril. Taking Lasix daily, no edema.   She is on Medicaid.  Previous evaluation at [facility] for shortness of breath and chest discomfort. She had workup for cardiac issues including echocardiogram and PET stress, CTA of the chest showing stable descending aorta repair with type B. aortic dissection with opacification of the false lumen at the level of the renal arteries via a lumbar artery. At the time of discharge from [facility], they decreased her lisinopril to 5 mg daily as her blood pressure was low. Since then blood pressure has been in the 115-120 range. She does continue to report chest and abdominal discomfort she is very concerned about GERD given her negative cardiac workup. She reports her mother has significant GI history, possible Barrett's. The patient has been on omeprazole for a long time with no relief of her symptoms. She is requesting referral to GI for further evaluation. She does not have a primary care physician at this  time.  Echocardiogram showed ejection fraction had improved to 45-50%, mild MR, TR (ejection fraction improved from 30-35% in January 2012)  Previous H/o abdominal pain, further workup showing biliary stones and sludge. s/p  laparoscopic cholecystectomy at [facility].  She takes Lasix for urinary retention. She denies any significant lower extremity edema   stress test in 2012 did not show ischemia in the anterior wall.  Per the notes from [facility], prelim result of her stress test showed no ischemia   EKG shows normal sinus rhythm with rate 73 beats per minute, first degree AV block   Outpatient Encounter Prescriptions as of 04/24/2013  Medication Sig Dispense Refill  . albuterol (PROVENTIL HFA;VENTOLIN HFA) 108 (90 BASE) MCG/ACT inhaler Inhale 2 puffs into the lungs every 4 (four) hours as needed.  1 Inhaler  3  . ALPRAZolam (XANAX) 1 MG tablet Take 0.5 mg by mouth 2 (two) times daily as needed for sleep or anxiety.      Marland Kitchen asenapine (SAPHRIS) 5 MG SUBL Place 5 mg under the tongue 2 (two) times daily.      Marland Kitchen aspirin 81 MG tablet Take 81 mg by mouth daily.        . benzonatate (TESSALON) 100 MG capsule Take 1 capsule (100 mg total) by mouth 3 (three) times daily as needed for cough.  45 capsule  0  . carvedilol (COREG) 12.5 MG tablet Take 1 tablet (12.5 mg total) by mouth 2 (two) times daily with a meal.  180 tablet  3  . Cholecalciferol (VITAMIN D3) 1000 UNITS CAPS Take 1,000 Units by mouth daily.      Marland Kitchen  cyclobenzaprine (FLEXERIL) 10 MG tablet Take 5 mg by mouth 3 (three) times daily as needed.      . fluticasone (FLONASE) 50 MCG/ACT nasal spray Place 1 spray into the nose daily.      . furosemide (LASIX) 20 MG tablet Take 20 mg by mouth daily.      Marland Kitchen GLUCOS-CHOND-STEROL-FISH OIL PO Take by mouth 2 (two) times daily.      . Lactobacillus Rhamnosus, GG, CAPS Take by mouth 2 (two) times daily.      Marland Kitchen lisinopril (PRINIVIL,ZESTRIL) 5 MG tablet Take 2.5 mg by mouth daily.      . Magnesium 250 MG TABS Take  250 mg by mouth daily.      . meclizine (ANTIVERT) 12.5 MG tablet Take 12.5 mg by mouth as needed.      Marland Kitchen omeprazole (PRILOSEC) 40 MG capsule Take 1 capsule (40 mg total) by mouth daily.  30 capsule  5  . oxyCODONE-acetaminophen (PERCOCET/ROXICET) 5-325 MG per tablet Take 1 tablet by mouth as needed for pain.      Marland Kitchen Potassium Aminobenzoate 500 MG TABS Take 500 mg by mouth daily.      . pravastatin (PRAVACHOL) 20 MG tablet Take 1 tablet (20 mg total) by mouth daily.  30 tablet  11  . promethazine (PHENERGAN) 25 MG tablet Take 12.5 mg by mouth every 8 (eight) hours as needed.       . sodium chloride (OCEAN) 0.65 % nasal spray Place 1 spray into the nose 4 (four) times daily as needed for congestion.      Marland Kitchen Spacer/Aero-Holding Chambers (POCKET SPACER) DEVI Use with albuterol  1 each  0  . traMADol (ULTRAM) 50 MG tablet 1-2 tablets by mouth up to every 6 hours as needed for pain  60 tablet  3    Review of Systems  Constitutional: Negative.   HENT: Negative.   Eyes: Negative.   Gastrointestinal: Positive for abdominal pain.  Musculoskeletal: Negative.   Skin: Negative.   Neurological: Negative.        Memory problems.  Psychiatric/Behavioral: Negative.   All other systems reviewed and are negative.   BP 110/70  Pulse 73  Ht 5\' 5"  (1.651 m)  Wt 196 lb 12 oz (89.245 kg)  BMI 32.74 kg/m2  Physical Exam  Nursing note and vitals reviewed. Constitutional: She is oriented to person, place, and time. She appears well-developed and well-nourished.  HENT:  Head: Normocephalic.  Nose: Nose normal.  Mouth/Throat: Oropharynx is clear and moist.  Eyes: Conjunctivae are normal. Pupils are equal, round, and reactive to light.  Neck: Normal range of motion. Neck supple. No JVD present.  Cardiovascular: Normal rate, regular rhythm, S1 normal, S2 normal and intact distal pulses.  Exam reveals no gallop and no friction rub.   Murmur heard.  Crescendo systolic murmur is present with a grade of 2/6   Decreased right radial pulse, left lower extremity pulses  Pulmonary/Chest: Effort normal and breath sounds normal. No respiratory distress. She has no wheezes. She has no rales. She exhibits no tenderness.  Well-healed mediastinal scar  Abdominal: Soft. Bowel sounds are normal. She exhibits no distension. There is no tenderness.  Musculoskeletal: Normal range of motion. She exhibits no edema and no tenderness.  Lymphadenopathy:    She has no cervical adenopathy.  Neurological: She is alert and oriented to person, place, and time. Coordination normal.  Skin: Skin is warm and dry. No rash noted. No erythema.  Psychiatric: She has a normal  mood and affect. Her behavior is normal. Judgment and thought content normal.    Assessment and Plan

## 2013-04-24 NOTE — Assessment & Plan Note (Signed)
Dissection down into the iliac artery. By her report, scan earlier this year at [facility], evaluated by vascular surgery. By her report, stable.

## 2013-04-24 NOTE — Assessment & Plan Note (Signed)
Blood pressure is well controlled on today's visit. No changes made to the medications. We have suggested she could try to skip a day periodically from her Lasix

## 2013-04-24 NOTE — Assessment & Plan Note (Signed)
We have encouraged continued exercise, careful diet management in an effort to lose weight. 

## 2013-04-24 NOTE — Assessment & Plan Note (Signed)
Etiology of her muscle cramping in the lower extremities and feet is uncertain. Could be secondary to diuretic and we have suggested she take this every other day. Uncertain if symptoms could be from her statin and we have suggested she try to hold this for several weeks to see if symptoms improve.

## 2013-04-24 NOTE — Patient Instructions (Addendum)
You are doing well.  Ok to hold lasix periodically for cramping If no improvement hold pravastatin for a couple weeks  Please call us if you have new issues that need to be addressed before your next appt.  Your physician wants you to follow-up in: 6 months.  You will receive a reminder letter in the mail two months in advance. If you don't receive a letter, please call our office to schedule the follow-up appointment.

## 2013-04-24 NOTE — Assessment & Plan Note (Signed)
Continues to have chronic back pain. Participating in back rehabilitation next month.

## 2013-04-24 NOTE — Assessment & Plan Note (Signed)
Last ultrasound August 2013. Prosthetic aortic valve was well seated. Repeat scan later this year.

## 2013-04-24 NOTE — Assessment & Plan Note (Signed)
Currently with no symptoms of angina. No further workup at this time. Continue current medication regimen. 

## 2013-04-25 ENCOUNTER — Telehealth: Payer: Self-pay | Admitting: *Deleted

## 2013-04-25 NOTE — Telephone Encounter (Signed)
Pt called to let Dr [name] know that her physical theriapist thinks that pt needs to start back to cardiac rehab

## 2013-04-25 NOTE — Telephone Encounter (Signed)
i informed pt i would send order to cardiac rehab and should wait for a response from them Understanding verb

## 2013-04-27 ENCOUNTER — Telehealth: Payer: Self-pay

## 2013-04-27 NOTE — Telephone Encounter (Signed)
Pt states ins([employer]) needs pre auth for her cardiac rehab. Their # [phone].

## 2013-04-30 NOTE — Telephone Encounter (Signed)
Does this patient need cardiac rehab still?

## 2013-04-30 NOTE — Telephone Encounter (Signed)
Per Dr. [name], pt should proceed with [facility] exercise program instead of cardiac rehab Will fax new order

## 2013-05-16 ENCOUNTER — Other Ambulatory Visit: Payer: Self-pay | Admitting: Family Medicine

## 2013-05-16 MED ORDER — TRAMADOL HCL 50 MG PO TABS: ORAL_TABLET | ORAL | Status: DC

## 2013-05-16 NOTE — Telephone Encounter (Signed)
Received fax refill request, Dr. Milinda Antis not in office all week, please advise

## 2013-06-20 ENCOUNTER — Telehealth: Payer: Self-pay | Admitting: Nurse Practitioner

## 2013-06-20 ENCOUNTER — Emergency Department: Payer: Self-pay | Admitting: Emergency Medicine

## 2013-06-20 LAB — CBC
HCT: 34.3 % — ABNORMAL LOW (ref 35.0–47.0)
HGB: 11.7 g/dL — ABNORMAL LOW (ref 12.0–16.0)
MCH: 32.2 pg (ref 26.0–34.0)
MCHC: 34.1 g/dL (ref 32.0–36.0)
MCV: 94 fL (ref 80–100)
Platelet: 217 10*3/uL (ref 150–440)
RBC: 3.64 10*6/uL — ABNORMAL LOW (ref 3.80–5.20)
RDW: 13.2 % (ref 11.5–14.5)
WBC: 7.1 10*3/uL (ref 3.6–11.0)

## 2013-06-20 LAB — COMPREHENSIVE METABOLIC PANEL
Albumin: 3.5 g/dL (ref 3.4–5.0)
Alkaline Phosphatase: 68 U/L (ref 50–136)
Anion Gap: 10 (ref 7–16)
BUN: 16 mg/dL (ref 7–18)
Bilirubin,Total: 0.3 mg/dL (ref 0.2–1.0)
Calcium, Total: 8.8 mg/dL (ref 8.5–10.1)
Chloride: 107 mmol/L (ref 98–107)
Co2: 26 mmol/L (ref 21–32)
Creatinine: 0.94 mg/dL (ref 0.60–1.30)
EGFR (African American): >60
EGFR (Non-African Amer.): >60
Glucose: 106 mg/dL — ABNORMAL HIGH (ref 65–99)
Osmolality: 287 (ref 275–301)
Potassium: 3.8 mmol/L (ref 3.5–5.1)
SGOT(AST): 17 U/L (ref 15–37)
SGPT (ALT): 26 U/L (ref 12–78)
Sodium: 143 mmol/L (ref 136–145)
Total Protein: 7 g/dL (ref 6.4–8.2)

## 2013-06-20 LAB — CK TOTAL AND CKMB (NOT AT ARMC)
CK, Total: 121 U/L (ref 21–215)
CK-MB: 0.7 ng/mL (ref 0.5–3.6)

## 2013-06-20 LAB — TROPONIN I: Troponin-I: <0.02 ng/mL

## 2013-06-20 NOTE — Telephone Encounter (Signed)
Pt called this AM stating that she has been having intermittent chest discomfort this AM radiating to her back and neck.  She is currently @ [facility] as a family member is having a procedure.  I advised that she present to the ED there for evaluation.  She verbalized understanding.

## 2013-07-09 ENCOUNTER — Ambulatory Visit (INDEPENDENT_AMBULATORY_CARE_PROVIDER_SITE_OTHER): Payer: Medicare PPO | Admitting: Cardiovascular Disease

## 2013-07-09 ENCOUNTER — Encounter: Payer: Self-pay | Admitting: Cardiovascular Disease

## 2013-07-09 VITALS — BP 112/74 | HR 67 | Ht 65.0 in | Wt 199.1 lb

## 2013-07-09 DIAGNOSIS — Z951 Presence of aortocoronary bypass graft: Secondary | ICD-10-CM

## 2013-07-09 DIAGNOSIS — I7100 Dissection of unspecified site of aorta: Secondary | ICD-10-CM

## 2013-07-09 DIAGNOSIS — I10 Essential (primary) hypertension: Secondary | ICD-10-CM

## 2013-07-09 DIAGNOSIS — E669 Obesity, unspecified: Secondary | ICD-10-CM

## 2013-07-09 DIAGNOSIS — I2510 Atherosclerotic heart disease of native coronary artery without angina pectoris: Secondary | ICD-10-CM

## 2013-07-09 DIAGNOSIS — E785 Hyperlipidemia, unspecified: Secondary | ICD-10-CM

## 2013-07-09 DIAGNOSIS — R079 Chest pain, unspecified: Secondary | ICD-10-CM | POA: Insufficient documentation

## 2013-07-09 DIAGNOSIS — R0602 Shortness of breath: Secondary | ICD-10-CM

## 2013-07-09 MED ORDER — NITROGLYCERIN 0.4 MG SL SUBL: 0.4000 mg | SUBLINGUAL_TABLET | SUBLINGUAL | Status: DC | PRN

## 2013-07-09 NOTE — Assessment & Plan Note (Signed)
We have encouraged continued exercise, careful diet management in an effort to lose weight. 

## 2013-07-09 NOTE — Patient Instructions (Addendum)
You are doing well. No medication changes were made.  We will schedule a stress test at [facility] 7/29 Arrive at 7:45  Please call us if you have new issues that need to be addressed before your next appt.  Your physician wants you to follow-up in: 6 months.  You will receive a reminder letter in the mail two months in advance. If you don't receive a letter, please call our office to schedule the follow-up appointment.     [facility] MYOVIEW  Your caregiver has ordered a Stress Test with nuclear imaging. The purpose of this test is to evaluate the blood supply to your heart muscle. This procedure is referred to as a "Non-Invasive Stress Test." This is because other than having an IV started in your vein, nothing is inserted or "invades" your body. Cardiac stress tests are done to find areas of poor blood flow to the heart by determining the extent of coronary artery disease (CAD). Some patients exercise on a treadmill, which naturally increases the blood flow to your heart, while others who are  unable to walk on a treadmill due to physical limitations have a pharmacologic/chemical stress agent called Lexiscan . This medicine will mimic walking on a treadmill by temporarily increasing your coronary blood flow.   Please note: these test may take anywhere between 2-4 hours to complete  PLEASE REPORT TO [facility] MEDICAL MALL ENTRANCE  THE VOLUNTEERS AT THE FIRST DESK WILL DIRECT YOU WHERE TO GO  Date of Procedure:______Tuesday July 29, 2014_______________________________  Arrival Time for Procedure:______7:45am________________________  Instructions regarding medication:   ____ : Hold diabetes medication morning of procedure  __X__:  Hold betablocker(s) night before procedure and morning of procedure- Carvedilol  ____:  Hold other medications as  follows:_________________________________________________________________________________________________________________________________________________________________________________________________________________________________________________________________________________________  PLEASE NOTIFY THE OFFICE AT LEAST 24 HOURS IN ADVANCE IF YOU ARE UNABLE TO KEEP YOUR APPOINTMENT.  [phone]  How to prepare for your Myoview test:  1. Do not eat or drink after midnight 2. No caffeine for 24 hours prior to test 3. No smoking 24 hours prior to test. 4. Your medication may be taken with water.  If your doctor stopped a medication because of this test, do not take that medication. 5. Ladies, please do not wear dresses.  Skirts or pants are appropriate. Please wear a short sleeve shirt. 6. No perfume, cologne or lotion. 7. Wear comfortable walking shoes. No heels!

## 2013-07-09 NOTE — Assessment & Plan Note (Signed)
Recommended she stay on her pravastatin. Encouraged weight loss

## 2013-07-09 NOTE — Assessment & Plan Note (Signed)
Blood pressure is well controlled on today's visit. No changes made to the medications. 

## 2013-07-09 NOTE — Assessment & Plan Note (Signed)
Recent CT scan of the chest abdomen pelvis evaluated by vascular surgery at [facility]. She has followup next week with vascular surgery.

## 2013-07-09 NOTE — Progress Notes (Signed)
Patient ID: Rachel Torres, female    DOB: 01-07-1956, 57 y.o.   MRN: [id number]  HPI Comments: Rachel Torres is a very pleasant 57 year-old woman who suffered an aorta type A dissection in September 2011 who was rushed to [facility] and underwent aortic valve replacement, bypass of the right coronary artery with venous donor site from her right thigh, aortic grafting for a extensive dissection of the  ascending aorta, residual descending aortic dissection extending into the iliac artery, with chronic back pain, previous  symptoms of shortness of breath, abdominal pain, hypertension and malaise.   She presents today with her fianc. She reports having recent evaluation in the emergency room for chest pain. Cardiac enzymes were negative , EKG unchanged .She had a CT scan dated 06/20/2013. She was transferred to [facility] given her complicated anatomy. By her report, images were evaluated by vascular surgery it was determined there have been no significant progression of her disease. She was told to have an outpatient Myoview given her coronary bypass and recent chest pain symptoms. Last stress test was over 2 years ago. She has followup with vascular surgery next week.  Previous evaluation at [facility] for shortness of breath and chest discomfort. She had workup for cardiac issues including echocardiogram and PET stress, CTA of the chest showing stable descending aorta repair with type B. aortic dissection with opacification of the false lumen at the level of the renal arteries via a lumbar artery.  Echocardiogram showed ejection fraction had improved to 45-50%, mild MR, TR (ejection fraction improved from 30-35% in January 2012)  Previous H/o abdominal pain, further workup showing biliary stones and sludge. s/p  laparoscopic cholecystectomy at [facility].  She takes Lasix for urinary retention. She denies any significant lower extremity edema   stress test in 2012 did not show ischemia in the anterior wall.  Per the  notes from [facility], prelim result of her stress test showed no ischemia   EKG shows normal sinus rhythm with rate 67 beats per minute, first degree AV block   Outpatient Encounter Prescriptions as of 04/24/2013  Medication Sig Dispense Refill  . albuterol (PROVENTIL HFA;VENTOLIN HFA) 108 (90 BASE) MCG/ACT inhaler Inhale 2 puffs into the lungs every 4 (four) hours as needed.  1 Inhaler  3  . ALPRAZolam (XANAX) 1 MG tablet Take 0.5 mg by mouth 2 (two) times daily as needed for sleep or anxiety.      Marland Kitchen asenapine (SAPHRIS) 5 MG SUBL Place 5 mg under the tongue 2 (two) times daily.      Marland Kitchen aspirin 81 MG tablet Take 81 mg by mouth daily.        . benzonatate (TESSALON) 100 MG capsule Take 1 capsule (100 mg total) by mouth 3 (three) times daily as needed for cough.  45 capsule  0  . carvedilol (COREG) 12.5 MG tablet Take 1 tablet (12.5 mg total) by mouth 2 (two) times daily with a meal.  180 tablet  3  . Cholecalciferol (VITAMIN D3) 1000 UNITS CAPS Take 1,000 Units by mouth daily.      . cyclobenzaprine (FLEXERIL) 10 MG tablet Take 5 mg by mouth 3 (three) times daily as needed.      . fluticasone (FLONASE) 50 MCG/ACT nasal spray Place 1 spray into the nose daily.      . furosemide (LASIX) 20 MG tablet Take 20 mg by mouth daily.      Marland Kitchen GLUCOS-CHOND-STEROL-FISH OIL PO Take by mouth 2 (two) times daily.      Marland Kitchen  Lactobacillus Rhamnosus, GG, CAPS Take by mouth 2 (two) times daily.      Marland Kitchen lisinopril (PRINIVIL,ZESTRIL) 5 MG tablet Take 2.5 mg by mouth daily.      . Magnesium 250 MG TABS Take 250 mg by mouth daily.      . meclizine (ANTIVERT) 12.5 MG tablet Take 12.5 mg by mouth as needed.      Marland Kitchen omeprazole (PRILOSEC) 40 MG capsule Take 1 capsule (40 mg total) by mouth daily.  30 capsule  5  . oxyCODONE-acetaminophen (PERCOCET/ROXICET) 5-325 MG per tablet Take 1 tablet by mouth as needed for pain.      Marland Kitchen Potassium Aminobenzoate 500 MG TABS Take 500 mg by mouth daily.      . pravastatin (PRAVACHOL) 20 MG tablet  Take 1 tablet (20 mg total) by mouth daily.  30 tablet  11  . promethazine (PHENERGAN) 25 MG tablet Take 12.5 mg by mouth every 8 (eight) hours as needed.       . sodium chloride (OCEAN) 0.65 % nasal spray Place 1 spray into the nose 4 (four) times daily as needed for congestion.      Marland Kitchen Spacer/Aero-Holding Chambers (POCKET SPACER) DEVI Use with albuterol  1 each  0  . traMADol (ULTRAM) 50 MG tablet 1-2 tablets by mouth up to every 6 hours as needed for pain  60 tablet  3    Review of Systems  Constitutional: Negative.   HENT: Negative.   Eyes: Negative.   Gastrointestinal: Positive for abdominal pain.  Musculoskeletal: Negative.   Skin: Negative.   Neurological: Negative.        Memory problems.  Psychiatric/Behavioral: Negative.   All other systems reviewed and are negative.   BP 112/74  Pulse 67  Ht 5\' 5"  (1.651 m)  Wt 199 lb 1.9 oz (90.32 kg)  BMI 33.14 kg/m2  Physical Exam  Nursing note and vitals reviewed. Constitutional: She is oriented to person, place, and time. She appears well-developed and well-nourished.  HENT:  Head: Normocephalic.  Nose: Nose normal.  Mouth/Throat: Oropharynx is clear and moist.  Eyes: Conjunctivae are normal. Pupils are equal, round, and reactive to light.  Neck: Normal range of motion. Neck supple. No JVD present.  Cardiovascular: Normal rate, regular rhythm, S1 normal, S2 normal and intact distal pulses.  Exam reveals no gallop and no friction rub.   Murmur heard.  Crescendo systolic murmur is present with a grade of 2/6  Decreased right radial pulse, left lower extremity pulses  Pulmonary/Chest: Effort normal and breath sounds normal. No respiratory distress. She has no wheezes. She has no rales. She exhibits no tenderness.  Well-healed mediastinal scar  Abdominal: Soft. Bowel sounds are normal. She exhibits no distension. There is no tenderness.  Musculoskeletal: Normal range of motion. She exhibits no edema and no tenderness.   Lymphadenopathy:    She has no cervical adenopathy.  Neurological: She is alert and oriented to person, place, and time. Coordination normal.  Skin: Skin is warm and dry. No rash noted. No erythema.  Psychiatric: She has a normal mood and affect. Her behavior is normal. Judgment and thought content normal.    Assessment and Plan

## 2013-07-09 NOTE — Assessment & Plan Note (Signed)
Stress test ordered given recent episodes of chest pain.

## 2013-07-09 NOTE — Assessment & Plan Note (Signed)
Etiology of her chest pain is uncertain. Unable to exclude angina. Other causes of chest pain include muscle spasms, GI, postsurgical pain. Nuclear Myoview has been ordered to rule out ischemia given her bypass surgery. She is unable to treadmill.

## 2013-07-10 ENCOUNTER — Encounter: Payer: Self-pay | Admitting: *Deleted

## 2013-07-17 ENCOUNTER — Ambulatory Visit: Payer: Self-pay | Admitting: Cardiovascular Disease

## 2013-07-17 DIAGNOSIS — R079 Chest pain, unspecified: Secondary | ICD-10-CM

## 2013-09-19 ENCOUNTER — Other Ambulatory Visit: Payer: Self-pay

## 2013-09-19 MED ORDER — PRAVASTATIN SODIUM 20 MG PO TABS: 20.0000 mg | ORAL_TABLET | Freq: Every day | ORAL | Status: DC

## 2013-09-19 NOTE — Telephone Encounter (Signed)
Refill sent for pravastatin. 

## 2013-10-25 ENCOUNTER — Other Ambulatory Visit: Payer: Self-pay

## 2013-11-28 ENCOUNTER — Other Ambulatory Visit: Payer: Self-pay

## 2013-11-28 MED ORDER — PRAVASTATIN SODIUM 20 MG PO TABS: 20.0000 mg | ORAL_TABLET | Freq: Every day | ORAL | Status: DC

## 2013-11-28 MED ORDER — CARVEDILOL 12.5 MG PO TABS: 12.5000 mg | ORAL_TABLET | Freq: Two times a day (BID) | ORAL | Status: DC

## 2013-11-28 MED ORDER — LISINOPRIL 5 MG PO TABS: 2.5000 mg | ORAL_TABLET | Freq: Every day | ORAL | Status: DC

## 2013-11-28 NOTE — Telephone Encounter (Signed)
Refill sent for pravastatin, carvedilol and lisinopril to [facility] mail order for 90 day supply.

## 2014-01-08 ENCOUNTER — Ambulatory Visit: Payer: Medicare PPO | Admitting: Cardiovascular Disease

## 2014-01-21 ENCOUNTER — Ambulatory Visit (INDEPENDENT_AMBULATORY_CARE_PROVIDER_SITE_OTHER): Payer: Medicare HMO | Admitting: Cardiovascular Disease

## 2014-01-21 ENCOUNTER — Encounter: Payer: Self-pay | Admitting: Cardiovascular Disease

## 2014-01-21 VITALS — BP 108/62 | HR 68 | Ht 65.0 in | Wt 195.5 lb

## 2014-01-21 DIAGNOSIS — Z951 Presence of aortocoronary bypass graft: Secondary | ICD-10-CM

## 2014-01-21 DIAGNOSIS — G4730 Sleep apnea, unspecified: Secondary | ICD-10-CM | POA: Diagnosis not present

## 2014-01-21 DIAGNOSIS — I10 Essential (primary) hypertension: Secondary | ICD-10-CM | POA: Diagnosis not present

## 2014-01-21 DIAGNOSIS — E785 Hyperlipidemia, unspecified: Secondary | ICD-10-CM | POA: Diagnosis not present

## 2014-01-21 DIAGNOSIS — I7100 Dissection of unspecified site of aorta: Secondary | ICD-10-CM

## 2014-01-21 NOTE — Progress Notes (Signed)
Patient ID: Rachel Torres, female    DOB: 20-Jun-1956, 58 y.o.   MRN: [id number]  HPI Comments: Rachel Torres is a very pleasant 58 year-old woman who suffered an aorta type A dissection in September 2011 who was rushed to [facility] and underwent aortic valve replacement, bypass of the right coronary artery with venous donor site from her right thigh, aortic grafting for a extensive dissection of the  ascending aorta, residual descending aortic dissection extending into the iliac artery, with chronic back pain, previous  symptoms of shortness of breath, abdominal pain, hypertension and malaise.   In followup today, she reports that she is doing well. She denies any recent episodes of chest pain. Previous CT scans had showed no dramatic change in her aortic dissection, possible minimal enlargement of the false lumen. She reports blood pressure has been low at home on her current medication regimen. No near syncope or syncope. She continues to have back pain. Recently diagnosed with obstructive sleep apnea. She has a CPAP machine but she is not using it.  Previous evaluations in the emergency room for chest pain. Cardiac enzymes were negative , EKG unchanged .She had a CT scan dated 06/20/2013. She was transferred to [facility] given her complicated anatomy. images were evaluated by vascular surgery it was determined there have been no significant progression of her disease  Previous evaluation at [facility] for shortness of breath and chest discomfort. She had workup for cardiac issues including echocardiogram and PET stress, CTA of the chest showing stable descending aorta repair with type B. aortic dissection with opacification of the false lumen at the level of the renal arteries via a lumbar artery.  Echocardiogram showed ejection fraction had improved to 45-50%, mild MR, TR (ejection fraction improved from 30-35% in January 2012)  Previous H/o abdominal pain, further workup showing biliary stones and  sludge. s/p  laparoscopic cholecystectomy at [facility].  She takes Lasix for urinary retention. She denies any significant lower extremity edema   stress test in 2012 did not show ischemia in the anterior wall.  Per the notes from [facility], prelim result of her stress test showed no ischemia   EKG shows normal sinus rhythm with rate 678beats per minute, first degree AV block   Outpatient Encounter Prescriptions as of 01/21/2014  Medication Sig  . albuterol (PROVENTIL HFA;VENTOLIN HFA) 108 (90 BASE) MCG/ACT inhaler Inhale 2 puffs into the lungs every 4 (four) hours as needed.  . ALPRAZolam (XANAX) 1 MG tablet Take 0.5 mg by mouth at bedtime as needed for anxiety or sleep.   Marland Kitchen aspirin 81 MG tablet Take 81 mg by mouth daily.    . carvedilol (COREG) 12.5 MG tablet Take 1 tablet (12.5 mg total) by mouth 2 (two) times daily with a meal.  . Cholecalciferol (VITAMIN D3) 1000 UNITS CAPS Take 1,000 Units by mouth daily.  . fluticasone (FLONASE) 50 MCG/ACT nasal spray Place 2 sprays into the nose daily.   . furosemide (LASIX) 20 MG tablet Take 20 mg by mouth daily.  . Lactobacillus Rhamnosus, GG, CAPS Take by mouth daily.   Marland Kitchen lisinopril (PRINIVIL,ZESTRIL) 5 MG tablet Take 0.5 tablets (2.5 mg total) by mouth daily.  . magnesium gluconate (MAGONATE) 500 MG tablet Take 500 mg by mouth daily.  . meclizine (ANTIVERT) 12.5 MG tablet Take 12.5 mg by mouth as needed.  . nitroGLYCERIN (NITROSTAT) 0.4 MG SL tablet Place 1 tablet (0.4 mg total) under the tongue every 5 (five) minutes as needed for chest pain.  Marland Kitchen  omeprazole (PRILOSEC) 40 MG capsule Take 1 capsule (40 mg total) by mouth daily.  Marland Kitchen oxyCODONE-acetaminophen (PERCOCET/ROXICET) 5-325 MG per tablet Take 1 tablet by mouth as needed for pain.  Marland Kitchen Potassium Aminobenzoate 500 MG TABS Take 500 mg by mouth daily.  . pravastatin (PRAVACHOL) 20 MG tablet Take 1 tablet (20 mg total) by mouth daily.  . promethazine (PHENERGAN) 25 MG tablet Take 12.5 mg by mouth every 8 (eight)  hours as needed.   . sodium chloride (OCEAN) 0.65 % nasal spray Place 1 spray into the nose 4 (four) times daily as needed for congestion.  Marland Kitchen Spacer/Aero-Holding Chambers (POCKET SPACER) DEVI Use with albuterol  . traMADol (ULTRAM) 50 MG tablet 1-2 tablets by mouth up to every 6 hours as needed for pain    Review of Systems  Constitutional: Negative.   HENT: Negative.   Eyes: Negative.   Respiratory: Negative.   Cardiovascular: Negative.   Gastrointestinal: Positive for abdominal pain.  Endocrine: Negative.   Musculoskeletal: Negative.   Skin: Negative.   Allergic/Immunologic: Negative.   Neurological: Negative.        Memory problems.  Hematological: Negative.   Psychiatric/Behavioral: Negative.   All other systems reviewed and are negative.   BP 108/62  Pulse 68  Ht 5\' 5"  (1.651 m)  Wt 195 lb 8 oz (88.678 kg)  BMI 32.53 kg/m2  Physical Exam  Nursing note and vitals reviewed. Constitutional: She is oriented to person, place, and time. She appears well-developed and well-nourished.  HENT:  Head: Normocephalic.  Nose: Nose normal.  Mouth/Throat: Oropharynx is clear and moist.  Eyes: Conjunctivae are normal. Pupils are equal, round, and reactive to light.  Neck: Normal range of motion. Neck supple. No JVD present.  Cardiovascular: Normal rate, regular rhythm, S1 normal, S2 normal and intact distal pulses.  Exam reveals no gallop and no friction rub.   Murmur heard.  Crescendo systolic murmur is present with a grade of 2/6  Decreased right radial pulse, left lower extremity pulses  Pulmonary/Chest: Effort normal and breath sounds normal. No respiratory distress. She has no wheezes. She has no rales. She exhibits no tenderness.  Well-healed mediastinal scar  Abdominal: Soft. Bowel sounds are normal. She exhibits no distension. There is no tenderness.  Musculoskeletal: Normal range of motion. She exhibits no edema and no tenderness.  Lymphadenopathy:    She has no cervical  adenopathy.  Neurological: She is alert and oriented to person, place, and time. Coordination normal.  Skin: Skin is warm and dry. No rash noted. No erythema.  Psychiatric: She has a normal mood and affect. Her behavior is normal. Judgment and thought content normal.    Assessment and Plan

## 2014-01-21 NOTE — Assessment & Plan Note (Signed)
We have recommended that she have her labs/lipids checked in the next week or so

## 2014-01-21 NOTE — Patient Instructions (Signed)
You are doing well. No medication changes were made.  Please come in for labs, fasting  Please call us if you have new issues that need to be addressed before your next appt.  Your physician wants you to follow-up in: 6 months.  You will receive a reminder letter in the mail two months in advance. If you don't receive a letter, please call our office to schedule the follow-up appointment.

## 2014-01-21 NOTE — Assessment & Plan Note (Signed)
She's not currently using her CPAP. Diagnosed with sleep apnea previously

## 2014-01-21 NOTE — Assessment & Plan Note (Signed)
Followed at [facility] by vascular surgery. No significant impression, possible enlargement of the false lumen which per the patient and an e-mail that she received is not a cause of concern.

## 2014-01-21 NOTE — Assessment & Plan Note (Signed)
Blood pressure is well controlled on today's visit. No changes made to the medications. 

## 2014-01-21 NOTE — Assessment & Plan Note (Signed)
Currently with no symptoms of angina. No further workup at this time. Continue current medication regimen. 

## 2014-02-04 ENCOUNTER — Ambulatory Visit (INDEPENDENT_AMBULATORY_CARE_PROVIDER_SITE_OTHER): Admitting: *Deleted

## 2014-02-04 DIAGNOSIS — Z951 Presence of aortocoronary bypass graft: Secondary | ICD-10-CM

## 2014-02-04 DIAGNOSIS — E785 Hyperlipidemia, unspecified: Secondary | ICD-10-CM

## 2014-02-05 LAB — LIPID PANEL
Chol/HDL Ratio: 3.9 ratio units (ref 0.0–4.4)
Cholesterol, Total: 181 mg/dL (ref 100–199)
HDL: 47 mg/dL (ref >39)
LDL Calculated: 107 mg/dL — ABNORMAL HIGH (ref 0–99)
Triglycerides: 137 mg/dL (ref 0–149)
VLDL Cholesterol Cal: 27 mg/dL (ref 5–40)

## 2014-02-05 LAB — HEPATIC FUNCTION PANEL
ALT: 17 IU/L (ref 0–32)
AST: 17 IU/L (ref 0–40)
Albumin: 4.4 g/dL (ref 3.5–5.5)
Alkaline Phosphatase: 62 IU/L (ref 39–117)
Bilirubin, Direct: 0.07 mg/dL (ref 0.00–0.40)
Total Bilirubin: 0.3 mg/dL (ref 0.0–1.2)
Total Protein: 7.3 g/dL (ref 6.0–8.5)

## 2014-02-06 ENCOUNTER — Other Ambulatory Visit: Payer: Self-pay

## 2014-02-06 MED ORDER — PRAVASTATIN SODIUM 40 MG PO TABS: 20.0000 mg | ORAL_TABLET | Freq: Every day | ORAL | Status: DC

## 2014-02-10 ENCOUNTER — Encounter: Payer: Self-pay | Admitting: Cardiovascular Disease

## 2014-02-11 MED ORDER — PRAVASTATIN SODIUM 40 MG PO TABS: 40.0000 mg | ORAL_TABLET | Freq: Every evening | ORAL | Status: DC

## 2014-02-27 ENCOUNTER — Other Ambulatory Visit: Payer: Self-pay

## 2014-02-27 MED ORDER — PRAVASTATIN SODIUM 40 MG PO TABS: 40.0000 mg | ORAL_TABLET | Freq: Every evening | ORAL | Status: DC

## 2014-04-10 ENCOUNTER — Encounter: Payer: Self-pay | Admitting: Cardiovascular Disease

## 2014-08-08 ENCOUNTER — Other Ambulatory Visit: Payer: Self-pay

## 2014-08-08 MED ORDER — CARVEDILOL 12.5 MG PO TABS: 12.5000 mg | ORAL_TABLET | Freq: Two times a day (BID) | ORAL | Status: DC

## 2014-08-08 MED ORDER — LISINOPRIL 5 MG PO TABS: 2.5000 mg | ORAL_TABLET | Freq: Every day | ORAL | Status: DC

## 2014-08-08 NOTE — Telephone Encounter (Signed)
Refill sent for lisinopril and coreg.

## 2014-09-02 ENCOUNTER — Encounter: Payer: Self-pay | Admitting: Internal Medicine

## 2014-09-30 ENCOUNTER — Ambulatory Visit (INDEPENDENT_AMBULATORY_CARE_PROVIDER_SITE_OTHER): Payer: Commercial Managed Care - HMO | Admitting: Cardiovascular Disease

## 2014-09-30 ENCOUNTER — Encounter: Payer: Self-pay | Admitting: Cardiovascular Disease

## 2014-09-30 VITALS — BP 120/78 | HR 64 | Ht 65.0 in | Wt 195.5 lb

## 2014-09-30 DIAGNOSIS — Z951 Presence of aortocoronary bypass graft: Secondary | ICD-10-CM

## 2014-09-30 DIAGNOSIS — I10 Essential (primary) hypertension: Secondary | ICD-10-CM

## 2014-09-30 DIAGNOSIS — I7100 Dissection of unspecified site of aorta: Secondary | ICD-10-CM

## 2014-09-30 DIAGNOSIS — I420 Dilated cardiomyopathy: Secondary | ICD-10-CM

## 2014-09-30 DIAGNOSIS — E785 Hyperlipidemia, unspecified: Secondary | ICD-10-CM

## 2014-09-30 DIAGNOSIS — G4733 Obstructive sleep apnea (adult) (pediatric): Secondary | ICD-10-CM

## 2014-09-30 DIAGNOSIS — R0602 Shortness of breath: Secondary | ICD-10-CM

## 2014-09-30 DIAGNOSIS — E669 Obesity, unspecified: Secondary | ICD-10-CM

## 2014-09-30 NOTE — Assessment & Plan Note (Signed)
Recently changed from pravastatin to Lipitor. No significant coronary disease on CT scan.

## 2014-09-30 NOTE — Assessment & Plan Note (Signed)
Currently with no symptoms of angina. No further workup at this time. Continue current medication regimen. 

## 2014-09-30 NOTE — Assessment & Plan Note (Signed)
Blood pressure is well controlled on today's visit. No changes made to the medications. 

## 2014-09-30 NOTE — Assessment & Plan Note (Signed)
Followed at [facility] annually with imaging. Recent MRI shows stable disease

## 2014-09-30 NOTE — Progress Notes (Signed)
Patient ID: Rachel Torres, female    DOB: June 14, 1956, 58 y.o.   MRN: [id number]  HPI Comments: [name] is a very pleasant 58 year-old woman who suffered an aorta type A dissection in September 2011 who was rushed to [facility] and underwent aortic valve replacement, bypass of the right coronary artery with venous donor site from her right thigh, aortic grafting for a extensive dissection of the  ascending aorta, residual descending aortic dissection extending into the iliac artery, with chronic back pain, previous  symptoms of shortness of breath, abdominal pain, hypertension and malaise.  She reports pathology of her aortic valve showed bicuspid valve  In followup today, she reports that she is doing well. She denies any recent episodes of chest pain. Previous CT scans had showed no dramatic change in her aortic dissection, possible minimal enlargement of the false lumen. She did have MRI recently at [facility] showing aorta is stable She was told that her aortic disease was not "genetic"  Chronic back pain. She does not do any regular exercise Change from pravastatin to Lipitor for muscle ache diagnosed with obstructive sleep apnea. She has a CPAP machine but she is not using it.  Previous evaluations in the emergency room for chest pain. Cardiac enzymes were negative , EKG unchanged .She had a CT scan dated 06/20/2013. She was transferred to [facility] given her complicated anatomy. images were evaluated by vascular surgery it was determined there have been no significant progression of her disease  Previous evaluation at [facility] for shortness of breath and chest discomfort. She had workup for cardiac issues including echocardiogram and PET stress, CTA of the chest showing stable descending aorta repair with type B. aortic dissection with opacification of the false lumen at the level of the renal arteries via a lumbar artery.  Echocardiogram showed ejection fraction had improved to 45-50%, mild MR, TR  (ejection fraction improved from 30-35% in January 2012)  Previous H/o abdominal pain, further workup showing biliary stones and sludge. s/p  laparoscopic cholecystectomy at [facility].  She takes Lasix for urinary retention. She denies any significant lower extremity edema   stress test in 2012 did not show ischemia in the anterior wall.  Per the notes from [facility], prelim result of her stress test showed no ischemia   EKG shows normal sinus rhythm with rate 64 beats per minute, first degree AV block   Outpatient Encounter Prescriptions as of 09/30/2014  Medication Sig  . albuterol (PROVENTIL HFA;VENTOLIN HFA) 108 (90 BASE) MCG/ACT inhaler Inhale 2 puffs into the lungs every 4 (four) hours as needed.  . ALPRAZolam (XANAX) 0.5 MG tablet Take 0.5 mg by mouth at bedtime as needed for anxiety.  Marland Kitchen aspirin 81 MG tablet Take 81 mg by mouth daily.    Marland Kitchen atorvastatin (LIPITOR) 20 MG tablet Take 20 mg by mouth daily.  . carvedilol (COREG) 12.5 MG tablet Take 1 tablet (12.5 mg total) by mouth 2 (two) times daily with a meal.  . Cholecalciferol (VITAMIN D3) 1000 UNITS CAPS Take 2,000 Units by mouth daily.   . fluticasone (FLONASE) 50 MCG/ACT nasal spray Place 2 sprays into the nose as needed.   Marland Kitchen lisinopril (PRINIVIL,ZESTRIL) 5 MG tablet Take 0.5 tablets (2.5 mg total) by mouth daily.  . magnesium gluconate (MAGONATE) 500 MG tablet Take 500 mg by mouth daily.  . meclizine (ANTIVERT) 12.5 MG tablet Take 12.5 mg by mouth as needed.  . nitroGLYCERIN (NITROSTAT) 0.4 MG SL tablet Place 1 tablet (0.4 mg  total) under the tongue every 5 (five) minutes as needed for chest pain.  Marland Kitchen omeprazole (PRILOSEC) 40 MG capsule Take 1 capsule (40 mg total) by mouth daily.  Marland Kitchen oxyCODONE-acetaminophen (PERCOCET/ROXICET) 5-325 MG per tablet Take 1 tablet by mouth as needed for pain.  Marland Kitchen Potassium Aminobenzoate 500 MG TABS Take 500 mg by mouth daily.  . promethazine (PHENERGAN) 25 MG tablet Take 12.5 mg by mouth every 8 (eight) hours as  needed.   . sodium chloride (OCEAN) 0.65 % nasal spray Place 1 spray into the nose 4 (four) times daily as needed for congestion.  Marland Kitchen Spacer/Aero-Holding Chambers (POCKET SPACER) DEVI Use with albuterol  . traMADol (ULTRAM) 50 MG tablet 1-2 tablets by mouth up to every 6 hours as needed for pain    Review of Systems  Constitutional: Negative.   HENT: Negative.   Eyes: Negative.   Respiratory: Negative.   Cardiovascular: Negative.   Gastrointestinal: Negative.   Endocrine: Negative.   Musculoskeletal: Positive for back pain.  Skin: Negative.   Allergic/Immunologic: Negative.   Neurological: Negative.        Memory problems.  Hematological: Negative.   Psychiatric/Behavioral: Negative.   All other systems reviewed and are negative.  BP 120/78  Pulse 64  Ht 5\' 5"  (1.651 m)  Wt 195 lb 8 oz (88.678 kg)  BMI 32.53 kg/m2  Physical Exam  Nursing note and vitals reviewed. Constitutional: She is oriented to person, place, and time. She appears well-developed and well-nourished.  HENT:  Head: Normocephalic.  Nose: Nose normal.  Mouth/Throat: Oropharynx is clear and moist.  Eyes: Conjunctivae are normal. Pupils are equal, round, and reactive to light.  Neck: Normal range of motion. Neck supple. No JVD present.  Cardiovascular: Normal rate, regular rhythm, S1 normal, S2 normal and intact distal pulses.  Exam reveals no gallop and no friction rub.   Murmur heard.  Crescendo systolic murmur is present with a grade of 2/6  Decreased right radial pulse, left lower extremity pulses  Pulmonary/Chest: Effort normal and breath sounds normal. No respiratory distress. She has no wheezes. She has no rales. She exhibits no tenderness.  Well-healed mediastinal scar  Abdominal: Soft. Bowel sounds are normal. She exhibits no distension. There is no tenderness.  Musculoskeletal: Normal range of motion. She exhibits no edema and no tenderness.  Lymphadenopathy:    She has no cervical adenopathy.   Neurological: She is alert and oriented to person, place, and time. Coordination normal.  Skin: Skin is warm and dry. No rash noted. No erythema.  Psychiatric: She has a normal mood and affect. Her behavior is normal. Judgment and thought content normal.    Assessment and Plan

## 2014-09-30 NOTE — Assessment & Plan Note (Signed)
We have encouraged continued exercise, careful diet management in an effort to lose weight. 

## 2014-09-30 NOTE — Assessment & Plan Note (Signed)
She did not tolerate CPAP.

## 2014-09-30 NOTE — Assessment & Plan Note (Signed)
Ejection fraction 40-45%. Appears relatively euvolemic on today's visit

## 2014-09-30 NOTE — Patient Instructions (Signed)
You are doing well. No medication changes were made.  Please call us if you have new issues that need to be addressed before your next appt.  Your physician wants you to follow-up in: 6 months.  You will receive a reminder letter in the mail two months in advance. If you don't receive a letter, please call our office to schedule the follow-up appointment.   

## 2015-01-08 DIAGNOSIS — M5136 Other intervertebral disc degeneration, lumbar region: Secondary | ICD-10-CM | POA: Diagnosis not present

## 2015-02-06 ENCOUNTER — Encounter: Payer: Self-pay | Admitting: Cardiovascular Disease

## 2015-02-07 ENCOUNTER — Encounter: Payer: Self-pay | Admitting: Cardiovascular Disease

## 2015-02-07 NOTE — Telephone Encounter (Signed)
Okay to draft a letter I do not feel she should be working at this time given the extent of her vascular disease

## 2015-02-19 ENCOUNTER — Encounter: Payer: Self-pay | Admitting: Family Medicine

## 2015-02-19 ENCOUNTER — Ambulatory Visit (INDEPENDENT_AMBULATORY_CARE_PROVIDER_SITE_OTHER): Payer: Medicare HMO | Admitting: Family Medicine

## 2015-02-19 VITALS — BP 122/78 | HR 65 | Temp 97.6°F | Ht 65.0 in | Wt 206.1 lb

## 2015-02-19 DIAGNOSIS — M549 Dorsalgia, unspecified: Secondary | ICD-10-CM | POA: Diagnosis not present

## 2015-02-19 DIAGNOSIS — H539 Unspecified visual disturbance: Secondary | ICD-10-CM | POA: Diagnosis not present

## 2015-02-19 DIAGNOSIS — G8929 Other chronic pain: Secondary | ICD-10-CM | POA: Diagnosis not present

## 2015-02-19 DIAGNOSIS — R110 Nausea: Secondary | ICD-10-CM

## 2015-02-19 DIAGNOSIS — F418 Other specified anxiety disorders: Secondary | ICD-10-CM | POA: Diagnosis not present

## 2015-02-19 MED ORDER — PROMETHAZINE HCL 25 MG PO TABS: 12.5000 mg | ORAL_TABLET | Freq: Three times a day (TID) | ORAL | Status: DC | PRN

## 2015-02-19 MED ORDER — ALPRAZOLAM 0.5 MG PO TABS: 0.5000 mg | ORAL_TABLET | Freq: Every evening | ORAL | Status: DC | PRN

## 2015-02-19 NOTE — Progress Notes (Signed)
Pre visit review using our clinic review tool, if applicable. No additional management support is needed unless otherwise documented below in the visit note. 

## 2015-02-19 NOTE — Patient Instructions (Signed)
Stop the percocet if it makes you itch  xanax as needed with caution of sedation for anxiety Phenergan for nausea as needed  Stop at check out for referral to eye doctor and also neuro surg

## 2015-02-19 NOTE — Progress Notes (Signed)
Subjective:    Patient ID: Rachel Torres, female    DOB: [birth year], 59 y.o.   MRN: [id number]  HPI Here for f/u of several issues   Has not been here in 2 1/2 years due to ins change and then it changed again so she could come back   Doing pretty well overall  Got re married   Wt is up 11 lb since oct bmi of 34  Heart issues are stable  Has had to take a break from exercise to care for husb with back surgery- will get better  Although he has parkinsons as well   Doing fair emotionally - a lot of stress  Needs a refill of xanax-she takes very rarely- last px back in the spring from Dr Boyd Kerbs her recent primary care that she moved from  Needs it for sleep- needs less than she used to   [employer] - for prn use  She gets upset stomach from anxiety-gives her nausea   occ problems with R eye - feels like it "goes in the wrong direction" Happens once every few mo  Has not seen an eye doctor for this  Wants to see someone in [place] from Dr Boyd Kerbs - starting to make her itch (for chronic back pain mid and low) - ? Arthritis Tramadol does not help this (it helps headaches) Seeing Dr Trenton Gammon - ordering MRI Uses heat   Patient Active Problem List   Diagnosis Date Noted  . Chest pain 07/09/2013  . Cramping of feet 04/24/2013  . Other screening mammogram 01/15/2013  . Vertigo 08/18/2012  . Vitamin D deficiency disease 07/03/2012  . Nausea 05/24/2012  . Aortic dissection 05/12/2012  . Anemia 05/12/2012  . Obesity 11/21/2011  . Unspecified vitamin D deficiency 10/19/2011  . Bronchitis 10/04/2011  . Routine general medical examination at a health care facility 09/20/2011  . Gynecological examination 09/20/2011  . Atypical chest pain 09/15/2011  . Congestive dilated cardiomyopathy 02/04/2011  . UNSPECIFIED ANEMIA 01/18/2011  . AORTIC VALVE REPLACEMENT, HX OF 01/07/2011  . CORONARY ARTERY BYPASS GRAFT, HX OF 01/07/2011  . DIZZINESS 01/06/2011  .  BACK PAIN 12/15/2010  . AAA 12/11/2010  . GALLBLADDER DISEASE 12/11/2010  . IRRITABLE BOWEL SYNDROME 09/09/2009  . PERSISTENT DISORDER INITIATING/MAINTAINING SLEEP 08/12/2009  . Obstructive sleep apnea 08/04/2009  . EXTERNAL HEMORRHOIDS, WITH COMPLICATION 27/25/3664  . OSTEOPOROSIS 01/09/2009  . POSTMENOPAUSAL STATUS 12/02/2008  . INSOMNIA 04/02/2008  . Hyperlipidemia 07/26/2007  . Depression with anxiety 07/26/2007  . Essential hypertension 07/26/2007  . CORONARY ARTERY SPASM 07/26/2007  . DYSRHYTHMIA, CARDIAC NOS 07/26/2007  . ALLERGIC RHINITIS 07/26/2007  . ASTHMA 07/26/2007  . GERD 07/26/2007  . OSTEOARTHRITIS 07/26/2007  . MURMUR 07/26/2007  . MIGRAINES, HX OF 07/26/2007   Past Medical History  Diagnosis Date  . Allergy   . Asthma   . Anxiety   . Depression   . GERD (gastroesophageal reflux disease)   . Hyperlipidemia   . Hypertension   . Osteoporosis   . Gallbladder sludge   . AAA (abdominal aortic aneurysm)     with rupture and surgery  . Marfan syndrome   . RA (rheumatoid arthritis)     per Dr. [name]  . Sleep apnea    Past Surgical History  Procedure Laterality Date  . Cesarean section  [place]  . Cardiac catheterization  2001  . Fracture surgery      shoulder replacement  . Orif distal radius fracture    .  Abdominal aortic aneurysm repair    . Cholecystectomy     History  Substance Use Topics  . Smoking status: Former Research scientist (life sciences)  . Smokeless tobacco: Never Used  . Alcohol Use: Yes     Comment: occassional   Family History  Problem Relation Age of Onset  . Hypertension Mother   . Depression Mother   . Osteoporosis Mother   . Stroke Mother   . Arthritis Sister   . Aneurysm Paternal Grandmother   . Nephrolithiasis Sister   . Osteoporosis Sister    Allergies  Allergen Reactions  . Alendronate Sodium     REACTION: GI  . Aspirin     REACTION: bruising  . Hydrocod Polst-Cpm Polst Er Itching  . Hydrocodone-Acetaminophen     REACTION:  itching  . Peanut-Containing Drug Products     REACTION: migraine  . Prednisone     REACTION: rash   Current Outpatient Prescriptions on File Prior to Visit  Medication Sig Dispense Refill  . albuterol (PROVENTIL HFA;VENTOLIN HFA) 108 (90 BASE) MCG/ACT inhaler Inhale 2 puffs into the lungs every 4 (four) hours as needed. 1 Inhaler 3  . ALPRAZolam (XANAX) 0.5 MG tablet Take 0.5 mg by mouth at bedtime as needed for anxiety.    Marland Kitchen aspirin 81 MG tablet Take 81 mg by mouth daily.      Marland Kitchen atorvastatin (LIPITOR) 20 MG tablet Take 20 mg by mouth daily.    . carvedilol (COREG) 12.5 MG tablet Take 1 tablet (12.5 mg total) by mouth 2 (two) times daily with a meal. 180 tablet 3  . Cholecalciferol (VITAMIN D3) 1000 UNITS CAPS Take 2,000 Units by mouth daily.     . fluticasone (FLONASE) 50 MCG/ACT nasal spray Place 2 sprays into the nose as needed.     Marland Kitchen lisinopril (PRINIVIL,ZESTRIL) 5 MG tablet Take 0.5 tablets (2.5 mg total) by mouth daily. 90 tablet 3  . magnesium gluconate (MAGONATE) 500 MG tablet Take 500 mg by mouth daily.    . meclizine (ANTIVERT) 12.5 MG tablet Take 12.5 mg by mouth as needed.    . nitroGLYCERIN (NITROSTAT) 0.4 MG SL tablet Place 1 tablet (0.4 mg total) under the tongue every 5 (five) minutes as needed for chest pain. 25 tablet 3  . omeprazole (PRILOSEC) 40 MG capsule Take 1 capsule (40 mg total) by mouth daily. 30 capsule 5  . oxyCODONE-acetaminophen (PERCOCET/ROXICET) 5-325 MG per tablet Take 1 tablet by mouth as needed for pain.    Marland Kitchen Potassium Aminobenzoate 500 MG TABS Take 500 mg by mouth daily.    . promethazine (PHENERGAN) 25 MG tablet Take 12.5 mg by mouth every 8 (eight) hours as needed.     . sodium chloride (OCEAN) 0.65 % nasal spray Place 1 spray into the nose 4 (four) times daily as needed for congestion.    Marland Kitchen Spacer/Aero-Holding Chambers (POCKET SPACER) DEVI Use with albuterol 1 each 0  . traMADol (ULTRAM) 50 MG tablet 1-2 tablets by mouth up to every 6 hours as needed  for pain 60 tablet 3   No current facility-administered medications on file prior to visit.      Review of Systems Review of Systems  Constitutional: Negative for fever, appetite change, fatigue and unexpected weight change.  Eyes: Negative for pain and drainage .  Respiratory: Negative for cough and shortness of breath.   Cardiovascular: Negative for cp or palpitations    Gastrointestinal: Negative for nausea, diarrhea and constipation.  Genitourinary: Negative for urgency and frequency.  Skin: Negative for pallor or rash   MSK pos for chronic mid and low back pain  Neurological: Negative for weakness, light-headedness, numbness and headaches.  Hematological: Negative for adenopathy. Does not bruise/bleed easily.  Psychiatric/Behavioral: Negative for dysphoric mood. Pos for anxiety         Objective:   Physical Exam  Constitutional: She appears well-developed and well-nourished. No distress.  HENT:  Head: Normocephalic and atraumatic.  Mouth/Throat: Oropharynx is clear and moist.  Eyes: Conjunctivae and EOM are normal. Pupils are equal, round, and reactive to light. Right eye exhibits no discharge. Left eye exhibits no discharge. No scleral icterus.  Neck: Normal range of motion. Neck supple. No JVD present. Carotid bruit is not present. No thyromegaly present.  Cardiovascular: Normal rate, regular rhythm, normal heart sounds and intact distal pulses.  Exam reveals no gallop.   Pulmonary/Chest: Effort normal and breath sounds normal. No respiratory distress. She has no wheezes. She exhibits no tenderness.  Abdominal: Soft. Bowel sounds are normal. She exhibits no distension and no abdominal bruit. There is no tenderness.  Musculoskeletal: Normal range of motion. She exhibits tenderness. She exhibits no edema.  LS bony tenderness   Lymphadenopathy:    She has no cervical adenopathy.  Neurological: She is alert. She has normal reflexes. No cranial nerve deficit. She exhibits normal  muscle tone. Coordination normal.  Skin: Skin is warm and dry. No rash noted. No pallor.  Psychiatric: She has a normal mood and affect.          Assessment & Plan:   Problem List Items Addressed This Visit      Other   Change in vision    Per pt - feels like her R eye wanders  Ref to opthy  Nothing seen on exam today      Relevant Orders   Ambulatory referral to Ophthalmology   Chronic back pain - Primary    Pt needs ref to Dr Trenton Gammon for upcoming f/u  Did not refill narcotic given side eff of itch May need pain clinic ref in the future       Relevant Orders   Ambulatory referral to Neurosurgery   Depression with anxiety    Needs refill of xanax for bedtime use  Was given by prev primary care and she is switching back  Warned of sedation and also habit forming potential Overall she thinks her mood is better than it used to be       Nausea    Chronic and worse with anxiety Given phenergan for prn use - warned of sedation

## 2015-02-23 NOTE — Assessment & Plan Note (Signed)
Pt needs ref to Dr Trenton Gammon for upcoming f/u  Did not refill narcotic given side eff of itch May need pain clinic ref in the future

## 2015-02-23 NOTE — Assessment & Plan Note (Signed)
Chronic and worse with anxiety Given phenergan for prn use - warned of sedation

## 2015-02-23 NOTE — Assessment & Plan Note (Signed)
Needs refill of xanax for bedtime use  Was given by prev primary care and she is switching back  Warned of sedation and also habit forming potential Overall she thinks her mood is better than it used to be

## 2015-02-23 NOTE — Assessment & Plan Note (Signed)
Per pt - feels like her R eye wanders  Ref to opthy  Nothing seen on exam today

## 2015-03-13 DIAGNOSIS — H43813 Vitreous degeneration, bilateral: Secondary | ICD-10-CM | POA: Diagnosis not present

## 2015-03-18 ENCOUNTER — Ambulatory Visit: Payer: Self-pay | Admitting: Neurosurgery

## 2015-03-18 DIAGNOSIS — M5136 Other intervertebral disc degeneration, lumbar region: Secondary | ICD-10-CM | POA: Diagnosis not present

## 2015-03-18 DIAGNOSIS — M5387 Other specified dorsopathies, lumbosacral region: Secondary | ICD-10-CM | POA: Diagnosis not present

## 2015-03-18 DIAGNOSIS — M5126 Other intervertebral disc displacement, lumbar region: Secondary | ICD-10-CM | POA: Diagnosis not present

## 2015-03-18 DIAGNOSIS — M5186 Other intervertebral disc disorders, lumbar region: Secondary | ICD-10-CM | POA: Diagnosis not present

## 2015-03-31 ENCOUNTER — Ambulatory Visit: Payer: Commercial Managed Care - HMO | Admitting: Cardiovascular Disease

## 2015-04-18 ENCOUNTER — Ambulatory Visit (INDEPENDENT_AMBULATORY_CARE_PROVIDER_SITE_OTHER): Payer: Medicare HMO | Admitting: Cardiovascular Disease

## 2015-04-18 ENCOUNTER — Encounter: Payer: Self-pay | Admitting: Cardiovascular Disease

## 2015-04-18 VITALS — BP 108/60 | HR 66 | Ht 65.0 in | Wt 200.5 lb

## 2015-04-18 DIAGNOSIS — I10 Essential (primary) hypertension: Secondary | ICD-10-CM

## 2015-04-18 DIAGNOSIS — Z952 Presence of prosthetic heart valve: Secondary | ICD-10-CM

## 2015-04-18 DIAGNOSIS — Z951 Presence of aortocoronary bypass graft: Secondary | ICD-10-CM

## 2015-04-18 DIAGNOSIS — E669 Obesity, unspecified: Secondary | ICD-10-CM

## 2015-04-18 DIAGNOSIS — I420 Dilated cardiomyopathy: Secondary | ICD-10-CM

## 2015-04-18 DIAGNOSIS — Z954 Presence of other heart-valve replacement: Secondary | ICD-10-CM | POA: Diagnosis not present

## 2015-04-18 DIAGNOSIS — I7100 Dissection of unspecified site of aorta: Secondary | ICD-10-CM

## 2015-04-18 DIAGNOSIS — E785 Hyperlipidemia, unspecified: Secondary | ICD-10-CM

## 2015-04-18 MED ORDER — LISINOPRIL 5 MG PO TABS: 2.5000 mg | ORAL_TABLET | Freq: Every day | ORAL | Status: DC

## 2015-04-18 MED ORDER — CARVEDILOL 12.5 MG PO TABS: 12.5000 mg | ORAL_TABLET | Freq: Two times a day (BID) | ORAL | Status: DC

## 2015-04-18 MED ORDER — ATORVASTATIN CALCIUM 20 MG PO TABS: 20.0000 mg | ORAL_TABLET | Freq: Every day | ORAL | Status: DC

## 2015-04-18 NOTE — Assessment & Plan Note (Signed)
We will order echocardiogram to evaluate her aortic valve. Last echocardiogram 2013

## 2015-04-18 NOTE — Assessment & Plan Note (Signed)
Blood pressure is well controlled on today's visit. No changes made to the medications. 

## 2015-04-18 NOTE — Patient Instructions (Signed)
You are doing well. No medication changes were made.  We will schedule an echo for AVR, CABG  Please call us if you have new issues that need to be addressed before your next appt.  Your physician wants you to follow-up in: 6 months.  You will receive a reminder letter in the mail two months in advance. If you don't receive a letter, please call our office to schedule the follow-up appointment.

## 2015-04-18 NOTE — Assessment & Plan Note (Signed)
Stable, recent MRA at [facility], followed by vascular surgery

## 2015-04-18 NOTE — Assessment & Plan Note (Signed)
Recommended that she stay on her Lipitor daily.

## 2015-04-18 NOTE — Assessment & Plan Note (Signed)
Ejection fraction 40-45%.  euvolemic on today's visit. She takes Lasix daily

## 2015-04-18 NOTE — Assessment & Plan Note (Signed)
We have encouraged continued exercise, careful diet management in an effort to lose weight. 

## 2015-04-18 NOTE — Progress Notes (Signed)
Patient ID: Rachel Torres, female    DOB: 1956/11/30, 59 y.o.   MRN: [id number]  HPI Comments: Rachel Torres is a very pleasant 59 year-old woman who suffered an aorta type A dissection in September 2011 who was rushed to [facility] and underwent aortic valve replacement, bypass of the right coronary artery with venous donor site from her right thigh, aortic grafting for a extensive dissection of the  ascending aorta, residual descending aortic dissection extending into the iliac artery, with chronic back pain, previous  symptoms of shortness of breath, abdominal pain, hypertension and malaise.  She reports pathology of her aortic valve showed bicuspid valve She presents today for follow-up of her aortic valve replacement and CABG.  In general she reports that she is doing well. She is discouraged that she has only lost 5 pounds No regular exercise. She plans on going back to the gym soon Recently saw vascular surgery at [facility], had MRI/MRA. Report reviewed with her showing stable dissection/surgical repair. Aortic root is stable No recent echocardiogram since 2013. She denies any significant lower extremity edema, no chest pain or shortness of breath with exertion.  EKG shows normal sinus rhythm with rate 66 bpm, nonspecific ST abnormality  Other past medical history Chronic back pain.  Change from pravastatin to Lipitor for muscle ache diagnosed with obstructive sleep apnea. She has a CPAP machine but she is not using it.  Previous evaluations in the emergency room for chest pain. Cardiac enzymes were negative , EKG unchanged .She had a CT scan dated 06/20/2013. She was transferred to [facility] given her complicated anatomy. images were evaluated by vascular surgery it was determined there have been no significant progression of her disease  Previous evaluation at [facility] for shortness of breath and chest discomfort. She had workup for cardiac issues including echocardiogram and PET stress, CTA of the  chest showing stable descending aorta repair with type B. aortic dissection with opacification of the false lumen at the level of the renal arteries via a lumbar artery.  Echocardiogram showed ejection fraction had improved to 45-50%, mild MR, TR (ejection fraction improved from 30-35% in January 2012)  Previous H/o abdominal pain, further workup showing biliary stones and sludge. s/p  laparoscopic cholecystectomy at [facility].  She takes Lasix for urinary retention.   Allergies  Allergen Reactions  . Alendronate Sodium     REACTION: GI  . Aspirin     REACTION: bruising  . Hydrocod Polst-Cpm Polst Er Itching  . Hydrocodone-Acetaminophen     REACTION: itching  . Oxycodone     Itching   . Peanut-Containing Drug Products     REACTION: migraine  . Prednisone     REACTION: rash    Outpatient Encounter Prescriptions as of 04/18/2015  Medication Sig  . albuterol (PROVENTIL HFA;VENTOLIN HFA) 108 (90 BASE) MCG/ACT inhaler Inhale 2 puffs into the lungs every 4 (four) hours as needed.  . ALPRAZolam (XANAX) 0.5 MG tablet Take 1 tablet (0.5 mg total) by mouth at bedtime as needed for anxiety or sleep.  Marland Kitchen aspirin 81 MG tablet Take 81 mg by mouth daily.    Marland Kitchen atorvastatin (LIPITOR) 20 MG tablet Take 1 tablet (20 mg total) by mouth daily.  . carvedilol (COREG) 12.5 MG tablet Take 1 tablet (12.5 mg total) by mouth 2 (two) times daily with a meal.  . Cholecalciferol (VITAMIN D3) 1000 UNITS CAPS Take 2,000 Units by mouth daily.   . fluticasone (FLONASE) 50 MCG/ACT nasal spray Place 2 sprays  into the nose as needed.   Marland Kitchen lisinopril (PRINIVIL,ZESTRIL) 5 MG tablet Take 0.5 tablets (2.5 mg total) by mouth daily.  . magnesium gluconate (MAGONATE) 500 MG tablet Take 500 mg by mouth daily.  . meclizine (ANTIVERT) 12.5 MG tablet Take 12.5 mg by mouth as needed.  . nitroGLYCERIN (NITROSTAT) 0.4 MG SL tablet Place 1 tablet (0.4 mg total) under the tongue every 5 (five) minutes as needed for chest pain.  Marland Kitchen omeprazole  (PRILOSEC) 40 MG capsule Take 1 capsule (40 mg total) by mouth daily.  . Potassium Aminobenzoate 500 MG TABS Take 500 mg by mouth daily.  . promethazine (PHENERGAN) 25 MG tablet Take 0.5 tablets (12.5 mg total) by mouth every 8 (eight) hours as needed for nausea.  . sodium chloride (OCEAN) 0.65 % nasal spray Place 1 spray into the nose 4 (four) times daily as needed for congestion.  Marland Kitchen Spacer/Aero-Holding Chambers (POCKET SPACER) DEVI Use with albuterol  . traMADol (ULTRAM) 50 MG tablet 1-2 tablets by mouth up to every 6 hours as needed for pain  . [DISCONTINUED] atorvastatin (LIPITOR) 20 MG tablet Take 20 mg by mouth daily.  . [DISCONTINUED] carvedilol (COREG) 12.5 MG tablet Take 1 tablet (12.5 mg total) by mouth 2 (two) times daily with a meal.  . [DISCONTINUED] lisinopril (PRINIVIL,ZESTRIL) 5 MG tablet Take 0.5 tablets (2.5 mg total) by mouth daily.  . [DISCONTINUED] oxyCODONE-acetaminophen (PERCOCET/ROXICET) 5-325 MG per tablet Take 1 tablet by mouth as needed for pain.    Past Medical History  Diagnosis Date  . Allergy   . Asthma   . Anxiety   . Depression   . GERD (gastroesophageal reflux disease)   . Hyperlipidemia   . Hypertension   . Osteoporosis   . Gallbladder sludge   . AAA (abdominal aortic aneurysm)     with rupture and surgery  . Marfan syndrome   . RA (rheumatoid arthritis)     per Dr. [name]  . Sleep apnea     Past Surgical History  Procedure Laterality Date  . Cesarean section  [place]  . Cardiac catheterization  2001  . Fracture surgery      shoulder replacement  . Orif distal radius fracture    . Abdominal aortic aneurysm repair    . Cholecystectomy      Social History  reports that she has quit smoking. She has never used smokeless tobacco. She reports that she drinks alcohol. She reports that she does not use illicit drugs.  Family History family history includes Aneurysm in her paternal grandmother; Arthritis in her sister; Depression in her  mother; Hypertension in her mother; Nephrolithiasis in her sister; Osteoporosis in her mother and sister; Stroke in her mother.   Review of Systems  Constitutional: Negative.   Respiratory: Negative.   Cardiovascular: Negative.   Gastrointestinal: Negative.   Musculoskeletal: Positive for back pain.  Skin: Negative.   Neurological: Negative.        Memory problems.  Hematological: Negative.   Psychiatric/Behavioral: Negative.   All other systems reviewed and are negative.  BP 108/60 mmHg  Pulse 66  Ht 5\' 5"  (1.651 m)  Wt 200 lb 8 oz (90.946 kg)  BMI 33.36 kg/m2  Physical Exam  Constitutional: She is oriented to person, place, and time. She appears well-developed and well-nourished.  HENT:  Head: Normocephalic.  Nose: Nose normal.  Mouth/Throat: Oropharynx is clear and moist.  Eyes: Conjunctivae are normal. Pupils are equal, round, and reactive to light.  Neck: Normal  range of motion. Neck supple. No JVD present.  Cardiovascular: Normal rate, regular rhythm, S1 normal, S2 normal and intact distal pulses.  Exam reveals no gallop and no friction rub.   Murmur heard.  Crescendo systolic murmur is present with a grade of 2/6  Decreased right radial pulse, left lower extremity pulses  Pulmonary/Chest: Effort normal and breath sounds normal. No respiratory distress. She has no wheezes. She has no rales. She exhibits no tenderness.  Well-healed mediastinal scar  Abdominal: Soft. Bowel sounds are normal. She exhibits no distension. There is no tenderness.  Musculoskeletal: Normal range of motion. She exhibits no edema or tenderness.  Lymphadenopathy:    She has no cervical adenopathy.  Neurological: She is alert and oriented to person, place, and time. Coordination normal.  Skin: Skin is warm and dry. No rash noted. No erythema.  Psychiatric: She has a normal mood and affect. Her behavior is normal. Judgment and thought content normal.    Assessment and Plan  Nursing note and  vitals reviewed.

## 2015-04-18 NOTE — Assessment & Plan Note (Signed)
No recent anginal symptoms. No further testing ordered

## 2015-05-06 ENCOUNTER — Other Ambulatory Visit: Payer: Self-pay

## 2015-05-06 ENCOUNTER — Ambulatory Visit (INDEPENDENT_AMBULATORY_CARE_PROVIDER_SITE_OTHER): Payer: Medicare HMO

## 2015-05-06 DIAGNOSIS — Z954 Presence of other heart-valve replacement: Secondary | ICD-10-CM

## 2015-05-06 DIAGNOSIS — Z951 Presence of aortocoronary bypass graft: Secondary | ICD-10-CM | POA: Diagnosis not present

## 2015-05-06 DIAGNOSIS — Z952 Presence of prosthetic heart valve: Secondary | ICD-10-CM

## 2015-05-09 ENCOUNTER — Other Ambulatory Visit: Payer: Self-pay | Admitting: Cardiovascular Disease

## 2015-05-27 ENCOUNTER — Telehealth: Payer: Self-pay | Admitting: Neurology

## 2015-05-27 NOTE — Telephone Encounter (Signed)
This is not a patient of our practice. This is patient's wife.

## 2015-05-27 NOTE — Telephone Encounter (Signed)
Shalinda returned your call/Dawn CB# [phone]

## 2015-06-19 NOTE — Telephone Encounter (Signed)
This encounter was created in error - please disregard.

## 2015-07-05 ENCOUNTER — Emergency Department: Payer: Medicare HMO

## 2015-07-05 ENCOUNTER — Emergency Department
Admission: EM | Admit: 2015-07-05 | Discharge: 2015-07-05 | Disposition: A | Discharge status: Home / Self Care | Payer: Medicare HMO | Attending: Emergency Medicine | Admitting: Emergency Medicine

## 2015-07-05 ENCOUNTER — Encounter: Payer: Self-pay | Admitting: Medical Oncology

## 2015-07-05 ENCOUNTER — Other Ambulatory Visit: Payer: Self-pay

## 2015-07-05 DIAGNOSIS — R079 Chest pain, unspecified: Secondary | ICD-10-CM | POA: Diagnosis not present

## 2015-07-05 DIAGNOSIS — Z79899 Other long term (current) drug therapy: Secondary | ICD-10-CM | POA: Diagnosis not present

## 2015-07-05 DIAGNOSIS — I10 Essential (primary) hypertension: Secondary | ICD-10-CM | POA: Diagnosis not present

## 2015-07-05 DIAGNOSIS — Z7982 Long term (current) use of aspirin: Secondary | ICD-10-CM | POA: Insufficient documentation

## 2015-07-05 LAB — CBC
HCT: 37.5 % (ref 35.0–47.0)
Hemoglobin: 12.8 g/dL (ref 12.0–16.0)
MCH: 32 pg (ref 26.0–34.0)
MCHC: 34.1 g/dL (ref 32.0–36.0)
MCV: 93.9 fL (ref 80.0–100.0)
Platelets: 230 10*3/uL (ref 150–440)
RBC: 3.99 MIL/uL (ref 3.80–5.20)
RDW: 13.3 % (ref 11.5–14.5)
WBC: 7.5 10*3/uL (ref 3.6–11.0)

## 2015-07-05 LAB — BASIC METABOLIC PANEL
Anion gap: 7 (ref 5–15)
BUN: 15 mg/dL (ref 6–20)
CO2: 26 mmol/L (ref 22–32)
Calcium: 8.8 mg/dL — ABNORMAL LOW (ref 8.9–10.3)
Chloride: 106 mmol/L (ref 101–111)
Creatinine, Ser: 0.96 mg/dL (ref 0.44–1.00)
GFR calc Af Amer: >60 mL/min (ref >60)
GFR calc non Af Amer: >60 mL/min (ref >60)
Glucose, Bld: 101 mg/dL — ABNORMAL HIGH (ref 65–99)
Potassium: 3.7 mmol/L (ref 3.5–5.1)
Sodium: 139 mmol/L (ref 135–145)

## 2015-07-05 LAB — TROPONIN I
Troponin I: <0.03 ng/mL (ref <0.031)
Troponin I: <0.03 ng/mL (ref <0.031)

## 2015-07-05 MED ORDER — GI COCKTAIL ~~LOC~~
30.0000 mL | ORAL | Status: AC
  Administered 2015-07-05: 30 mL via ORAL
  Filled 2015-07-05: qty 30

## 2015-07-05 MED ORDER — FAMOTIDINE 20 MG PO TABS
40.0000 mg | ORAL_TABLET | Freq: Once | ORAL | Status: AC
  Administered 2015-07-05: 40 mg via ORAL
  Filled 2015-07-05: qty 2

## 2015-07-05 MED ORDER — HYDROMORPHONE HCL 1 MG/ML IJ SOLN
1.0000 mg | INTRAMUSCULAR | Status: AC
  Administered 2015-07-05: 1 mg via INTRAVENOUS
  Filled 2015-07-05: qty 1

## 2015-07-05 MED ORDER — SODIUM CHLORIDE 0.9 % IV BOLUS (SEPSIS)
1000.0000 mL | Freq: Once | INTRAVENOUS | Status: AC
  Administered 2015-07-05: 1000 mL via INTRAVENOUS

## 2015-07-05 MED ORDER — RANITIDINE HCL 150 MG PO CAPS: 150.0000 mg | ORAL_CAPSULE | Freq: Two times a day (BID) | ORAL | Status: DC

## 2015-07-05 MED ORDER — IOHEXOL 350 MG/ML SOLN
100.0000 mL | Freq: Once | INTRAVENOUS | Status: AC | PRN
  Administered 2015-07-05: 100 mL via INTRAVENOUS

## 2015-07-05 MED ORDER — SUCRALFATE 1 G PO TABS: 1.0000 g | ORAL_TABLET | Freq: Four times a day (QID) | ORAL | Status: DC

## 2015-07-05 NOTE — ED Provider Notes (Signed)
-----------------------------------------   9:15 PM on 07/05/2015 -----------------------------------------   Blood pressure 134/73, pulse 60, temperature 98 F (36.7 C), temperature source Oral, resp. rate 18, height 5\' 5"  (1.651 m), weight 198 lb (89.812 kg), SpO2 97 %.  Assuming care from Dr. Joni Fears.  In short, Rachel Torres is a 59 y.o. female with a chief complaint of Chest Pain .  Refer to the original H&P for additional details.  The current plan of care is to follow up CTA chest and troponin and reassess.  ----------------------------------------- 10:05 PM on 07/05/2015 -----------------------------------------  Dg Chest 2 View  07/05/2015   CLINICAL DATA:  Chest pain starting 1 hour  EXAM: CHEST  2 VIEW  COMPARISON:  06/20/2013  FINDINGS: Cardiomediastinal silhouette is stable. Status post median sternotomy. No acute infiltrate or pleural effusion. No pulmonary edema. Right shoulder prosthesis again noted.  IMPRESSION: No active cardiopulmonary disease.   Electronically Signed   By: Lahoma Crocker M.D.   On: 07/05/2015 19:04   Ct Angio Chest Aorta W/cm &/or Wo/cm  07/05/2015   CLINICAL DATA:  Chest pain radiating to the back  EXAM: CT ANGIOGRAPHY CHEST WITH CONTRAST  TECHNIQUE: Multidetector CT imaging of the chest was performed using the standard protocol during bolus administration of intravenous contrast. Multiplanar CT image reconstructions and MIPs were obtained to evaluate the vascular anatomy.  CONTRAST:  11mL OMNIPAQUE IOHEXOL 350 MG/ML SOLN  COMPARISON:  06/20/2013, 08/23/2010  FINDINGS: The lungs are well aerated bilaterally. No focal infiltrate or sizable effusion is seen.  The thoracic inlet is within normal limits. Thoracic aorta again demonstrates postoperative change consistent with the history of dissection repair and coronary bypass grafting. The descending thoracic aorta is within normal limits without evidence of dissection. No aneurysmal dilatation is seen. In the  upper abdominal aorta there is again noted residual opacification in the false lumen of the previous dissection. This is stable in appearance from the prior exam. The upper abdomen is otherwise within normal limits.  The pulmonary artery demonstrates a normal branching pattern without evidence of pulmonary embolism. No hilar or mediastinal adenopathy is seen. Native coronary calcifications are noted. These have progressed in the left anterior descending coronary artery when compare with the prior exam. No acute bony abnormality is noted. Changes of prior median sternotomy are seen.  Review of the MIP images confirms the above findings.  IMPRESSION: No evidence of pulmonary embolism.  Changes consistent with prior coronary bypass grafting and repair of a thoracic aortic dissection. No recurrent dissection is noted.  Persistent opacification of a portion of the false lumen within the abdominal aorta. This is stable in appearance from 2014.  Progression in native coronary calcification within the left anterior descending coronary artery. This may be related to the patient's underlying discomfort.   Electronically Signed   By: Inez Catalina M.D.   On: 07/05/2015 21:38    Nonacute CTA chest, negative second troponin.  Pain is adequately controlled according to the patient.  She understands and agrees to Dr. Jerene Canny plan of outpatient.   Hinda Kehr, MD 07/05/15 2205

## 2015-07-05 NOTE — Discharge Instructions (Signed)

## 2015-07-05 NOTE — ED Provider Notes (Signed)
[facility] Emergency Department Provider Note  ____________________________________________  Time seen: 8:20 PM  I have reviewed the triage vital signs and the nursing notes.   HISTORY  Chief Complaint Chest Pain    HPI Rachel Torres is a 59 y.o. female who complains of central chest pain that woke her up around 1:00 AM. It was improved with getting up and walking around. She did not have any vomiting. But it did radiate to her neck and upper back. It does feel like a prior thoracic aortic dissection that she had 13 years ago that extended from her aortic root all the way into her left lower extremity.  The pain subsided after 15 or 20 minutes, and she is able to go back to bed. She had a recurrence this afternoon while again lying down which lasted for another 15 or 20 minutes. No exertional or pleuritic pain. No dizziness or syncope, shortness of breath, fevers chills, vomiting or abdominal pain.  The pain is aching and tightness, radiating as above, no aggravating or alleviating factors except that it seems to be associated with lying down over the 2 episodes today, associated with nausea.  She follows up with thoracic surgery at [facility].     Past Medical History  Diagnosis Date  . Allergy   . Asthma   . Anxiety   . Depression   . GERD (gastroesophageal reflux disease)   . Hyperlipidemia   . Hypertension   . Osteoporosis   . Gallbladder sludge   . AAA (abdominal aortic aneurysm)     with rupture and surgery  . Marfan syndrome   . RA (rheumatoid arthritis)     per Dr. [name]  . Sleep apnea     Patient Active Problem List   Diagnosis Date Noted  . Change in vision 02/19/2015  . Chest pain 07/09/2013  . Cramping of feet 04/24/2013  . Other screening mammogram 01/15/2013  . Vertigo 08/18/2012  . Vitamin D deficiency disease 07/03/2012  . Nausea 05/24/2012  . Aortic dissection 05/12/2012  . Anemia 05/12/2012  . Obesity 11/21/2011  .  Unspecified vitamin D deficiency 10/19/2011  . Bronchitis 10/04/2011  . Routine general medical examination at a health care facility 09/20/2011  . Gynecological examination 09/20/2011  . Atypical chest pain 09/15/2011  . Congestive dilated cardiomyopathy 02/04/2011  . UNSPECIFIED ANEMIA 01/18/2011  . S/P AVR (aortic valve replacement) 01/07/2011  . CORONARY ARTERY BYPASS GRAFT, HX OF 01/07/2011  . DIZZINESS 01/06/2011  . Chronic back pain 12/15/2010  . AAA 12/11/2010  . GALLBLADDER DISEASE 12/11/2010  . IRRITABLE BOWEL SYNDROME 09/09/2009  . PERSISTENT DISORDER INITIATING/MAINTAINING SLEEP 08/12/2009  . Obstructive sleep apnea 08/04/2009  . EXTERNAL HEMORRHOIDS, WITH COMPLICATION 28/41/3244  . OSTEOPOROSIS 01/09/2009  . POSTMENOPAUSAL STATUS 12/02/2008  . INSOMNIA 04/02/2008  . Hyperlipidemia 07/26/2007  . Depression with anxiety 07/26/2007  . Essential hypertension 07/26/2007  . CORONARY ARTERY SPASM 07/26/2007  . DYSRHYTHMIA, CARDIAC NOS 07/26/2007  . ALLERGIC RHINITIS 07/26/2007  . ASTHMA 07/26/2007  . GERD 07/26/2007  . OSTEOARTHRITIS 07/26/2007  . MURMUR 07/26/2007  . MIGRAINES, HX OF 07/26/2007    Past Surgical History  Procedure Laterality Date  . Cesarean section  [place]  . Cardiac catheterization  2001  . Fracture surgery      shoulder replacement  . Orif distal radius fracture    . Abdominal aortic aneurysm repair    . Cholecystectomy      Current Outpatient Rx  Name  Route  Sig  Dispense  Refill  . albuterol (PROVENTIL HFA;VENTOLIN HFA) 108 (90 BASE) MCG/ACT inhaler   Inhalation   Inhale 2 puffs into the lungs every 4 (four) hours as needed.   1 Inhaler   3   . ALPRAZolam (XANAX) 0.5 MG tablet   Oral   Take 1 tablet (0.5 mg total) by mouth at bedtime as needed for anxiety or sleep.   30 tablet   1   . aspirin 81 MG tablet   Oral   Take 81 mg by mouth daily.           Marland Kitchen atorvastatin (LIPITOR) 20 MG tablet   Oral   Take 1 tablet (20 mg  total) by mouth daily.   90 tablet   3   . carvedilol (COREG) 12.5 MG tablet      TAKE 1 TABLET TWICE DAILY  WITH  A  MEAL   180 tablet   3   . Cholecalciferol (VITAMIN D3) 1000 UNITS CAPS   Oral   Take 2,000 Units by mouth daily.          . fluticasone (FLONASE) 50 MCG/ACT nasal spray   Nasal   Place 2 sprays into the nose as needed.          Marland Kitchen lisinopril (PRINIVIL,ZESTRIL) 5 MG tablet   Oral   Take 0.5 tablets (2.5 mg total) by mouth daily.   45 tablet   3   . magnesium gluconate (MAGONATE) 500 MG tablet   Oral   Take 500 mg by mouth daily.         . meclizine (ANTIVERT) 12.5 MG tablet   Oral   Take 12.5 mg by mouth as needed.         . nitroGLYCERIN (NITROSTAT) 0.4 MG SL tablet   Sublingual   Place 1 tablet (0.4 mg total) under the tongue every 5 (five) minutes as needed for chest pain.   25 tablet   3   . omeprazole (PRILOSEC) 40 MG capsule   Oral   Take 1 capsule (40 mg total) by mouth daily.   30 capsule   5   . Potassium Aminobenzoate 500 MG TABS   Oral   Take 500 mg by mouth daily.         . promethazine (PHENERGAN) 25 MG tablet   Oral   Take 0.5 tablets (12.5 mg total) by mouth every 8 (eight) hours as needed for nausea.   30 tablet   1   . ranitidine (ZANTAC) 150 MG capsule   Oral   Take 1 capsule (150 mg total) by mouth 2 (two) times daily.   28 capsule   0   . sodium chloride (OCEAN) 0.65 % nasal spray   Nasal   Place 1 spray into the nose 4 (four) times daily as needed for congestion.         Marland Kitchen Spacer/Aero-Holding Chambers (POCKET SPACER) DEVI      Use with albuterol   1 each   0   . sucralfate (CARAFATE) 1 G tablet   Oral   Take 1 tablet (1 g total) by mouth 4 (four) times daily.   120 tablet   1   . traMADol (ULTRAM) 50 MG tablet      1-2 tablets by mouth up to every 6 hours as needed for pain   60 tablet   3     Allergies Alendronate sodium; Aspirin; Hydrocod polst-cpm polst er; Hydrocodone-acetaminophen;  Oxycodone; Peanut-containing  drug products; and Prednisone  Family History  Problem Relation Age of Onset  . Hypertension Mother   . Depression Mother   . Osteoporosis Mother   . Stroke Mother   . Arthritis Sister   . Aneurysm Paternal Grandmother   . Nephrolithiasis Sister   . Osteoporosis Sister     Social History History  Substance Use Topics  . Smoking status: Former Research scientist (life sciences)  . Smokeless tobacco: Never Used  . Alcohol Use: Yes     Comment: occassional    Review of Systems  Constitutional: No fever or chills. No weight changes Eyes:No blurry vision or double vision.  ENT: No sore throat. Cardiovascular: Positive chest pain as above. Respiratory: No dyspnea or cough. Gastrointestinal: Negative for abdominal pain, vomiting and diarrhea.  No BRBPR or melena. Genitourinary: Negative for dysuria, urinary retention, bloody urine, or difficulty urinating. Musculoskeletal: Negative for back pain. No joint swelling or pain. Skin: Negative for rash. Neurological: Negative for headaches, focal weakness or numbness. Psychiatric:No anxiety or depression.   Endocrine:No hot/cold intolerance, changes in energy, or sleep difficulty.  10-point ROS otherwise negative.  ____________________________________________   PHYSICAL EXAM:  VITAL SIGNS: ED Triage Vitals  Enc Vitals Group     BP 07/05/15 1748 120/56 mmHg     Pulse Rate 07/05/15 1748 67     Resp 07/05/15 1748 18     Temp 07/05/15 1748 98 F (36.7 C)     Temp Source 07/05/15 1748 Oral     SpO2 07/05/15 1748 97 %     Weight 07/05/15 1748 198 lb (89.812 kg)     Height 07/05/15 1748 5\' 5"  (1.651 m)     Head Cir --      Peak Flow --      Pain Score 07/05/15 1749 7     Pain Loc --      Pain Edu? --      Excl. in [place]? --      Constitutional: Alert and oriented. Well appearing and in no distress. Eyes: No scleral icterus. No conjunctival pallor. PERRL. EOMI ENT   Head: Normocephalic and atraumatic.   Nose: No  congestion/rhinnorhea. No septal hematoma   Mouth/Throat: MMM, no pharyngeal erythema. No peritonsillar mass. No uvula shift.   Neck: No stridor. No SubQ emphysema. No meningismus. Hematological/Lymphatic/Immunilogical: No cervical lymphadenopathy. Cardiovascular: RRR. Normal and symmetric distal pulses are present in all extremities. No murmurs, rubs, or gallops. Respiratory: Normal respiratory effort without tachypnea nor retractions. Breath sounds are clear and equal bilaterally. No wheezes/rales/rhonchi. Gastrointestinal: Soft and nontender. No distention. There is no CVA tenderness.  No rebound, rigidity, or guarding. Genitourinary: deferred Musculoskeletal: Nontender with normal range of motion in all extremities. No joint effusions.  No lower extremity tenderness.  No edema. Neurologic:   Normal speech and language.  CN 2-10 normal. Motor grossly intact. Sensation intact in all 4 extremities No pronator drift.  Normal gait. No gross focal neurologic deficits are appreciated.  Skin:  Skin is warm, dry and intact. No rash noted.  No petechiae, purpura, or bullae. Psychiatric: Mood and affect are normal. Speech and behavior are normal. Patient exhibits appropriate insight and judgment.  ____________________________________________    LABS (pertinent positives/negatives) (all labs ordered are listed, but only abnormal results are displayed) Labs Reviewed  BASIC METABOLIC PANEL - Abnormal; Notable for the following:    Glucose, Bld 101 (*)    Calcium 8.8 (*)    All other components within normal limits  CBC  TROPONIN I  TROPONIN I   ____________________________________________   EKG  Interpreted by me Sinus rhythm rate of 69, normal axis, first-degree AV block with PR interval of 2:30 milliseconds, poor progression in anterior precordial leads, normal ST segments and T waves. There are voltage criteria for left ventricular hypertrophy in the high lateral  leads  ____________________________________________    RADIOLOGY  CT angiography chest pending  ____________________________________________   PROCEDURES  ____________________________________________   INITIAL IMPRESSION / ASSESSMENT AND PLAN / ED COURSE  Pertinent labs & imaging results that were available during my care of the patient were reviewed by me and considered in my medical decision making (see chart for details).  Patient presents with chest pain which is concerning for a complication of her prior thoracic aortic dissection. No evidence of vascular occlusion at this time. Exam is otherwise unremarkable. We'll proceed with a CT angiogram of the chest to evaluate the integrity of her aorta. We'll also send a repeat troponin for further risk stratification, although I have low suspicion that this is ACS PE pneumothorax carditis mediastinitis pneumonia or sepsis. No evidence of soft tissue infection.  Care the patient is signed out to Dr. Karma Greaser pending the workup for further disposition.  ____________________________________________   FINAL CLINICAL IMPRESSION(S) / ED DIAGNOSES  Final diagnoses:  Chest pain, unspecified chest pain type      Carrie Mew, MD 07/05/15 2114

## 2015-07-05 NOTE — ED Notes (Signed)
Pt to triage with reports of left sided chest pain that woke her up around 0100 this am. Pt reports some sob with pain.

## 2015-07-28 DIAGNOSIS — F410 Panic disorder [episodic paroxysmal anxiety] without agoraphobia: Secondary | ICD-10-CM | POA: Insufficient documentation

## 2015-07-28 LAB — HM PAP SMEAR: HM Pap smear: POSITIVE

## 2015-10-06 ENCOUNTER — Ambulatory Visit: Payer: Medicare HMO | Attending: Student

## 2015-10-06 DIAGNOSIS — M549 Dorsalgia, unspecified: Secondary | ICD-10-CM | POA: Diagnosis present

## 2015-10-07 NOTE — Therapy (Signed)
[place] MAIN [facility] SERVICES [address] [place], [place], [address] Phone: [phone]   Fax:  [phone]  Physical Therapy Evaluation  Patient Details  Name: Rachel Torres MRN: [id number] Date of Birth: 09/19/1956 Referring Provider: Dr. Barbra Sarks   Encounter Date: 10/06/2015      PT End of Session - 10/07/15 1001    Visit Number 1   Number of Visits 9   Date for PT Re-Evaluation 11/03/15   Authorization Type 1/10 g codes   PT Start Time 0845   PT Stop Time 0945   PT Time Calculation (min) 60 min   Activity Tolerance Patient tolerated treatment well   Behavior During Therapy [facility] for tasks assessed/performed      Past Medical History  Diagnosis Date  . Allergy   . Asthma   . Anxiety   . Depression   . GERD (gastroesophageal reflux disease)   . Hyperlipidemia   . Hypertension   . Osteoporosis   . Gallbladder sludge   . AAA (abdominal aortic aneurysm) ([place])     with rupture and surgery  . Marfan syndrome   . RA (rheumatoid arthritis) ([facility])     per Dr. [name]  . Sleep apnea     Past Surgical History  Procedure Laterality Date  . Cesarean section  [place]  . Cardiac catheterization  2001  . Fracture surgery      shoulder replacement  . Orif distal radius fracture    . Abdominal aortic aneurysm repair    . Cholecystectomy      There were no vitals filed for this visit.  Visit Diagnosis:  Mid back pain      Subjective Assessment - 10/07/15 1000    Subjective Pt has had upper back pain for years and broke her T12 when she was 59 years old.  Pt had AAA surgery 5 years ago. Pt has lipoma on left side near her scapula and goes between ribs into her spine.  pt's upper back and mid back pain is getting worse and it feels like an "ache."  Pt started experiencing numbness and tingling in both UEs in the last few months, primarily in the lateral aspect of her hand on the palmer aspect.  Pt notes washing dishes  and prolonged exercise (walking) increases her pain, to the point that it feels like a "tight band" around her chest.  Pt does not lift anything greater than 25# but has no restrictions.  Icing and heat help decrease her pain and sitting in a slump position versus upright.  Pt notes extensive scar tissue from aorta down to her femur from aortic dissection.  Pt has noticed increased back pain with increased breast size after menopause in the past 5 years.  Pt sees a Psychologist, sport and exercise on Friday regarding her lipoma.  Pt has not had PT for upper back and has fallen  once in the past 6 months and three in the last year due to losing her footing or balance.    Patient Stated Goals to decrease pain and improve posture.     Currently in Pain? Yes   Pain Score 4    Pain Location Back   Pain Orientation Mid   Pain Descriptors / Indicators Aching            [facility] PT Assessment - 10/07/15 0941    Assessment   Medical Diagnosis Upper back pain   Referring Provider Dr. Barbra Sarks  Onset Date/Surgical Date --  yeasrs   Hand Dominance Right   Prior Therapy none   Precautions   Precautions --  AAA    Precaution Comments pt had AAA dissection    Restrictions   Weight Bearing Restrictions No   Balance Screen   Has the patient fallen in the past 6 months Yes   How many times? 1   Has the patient had a decrease in activity level because of a fear of falling?  No   Is the patient reluctant to leave their home because of a fear of falling?  No   Home Ecologist residence   Living Arrangements Spouse/significant other   Available Help at Discharge Family   Type of [place] Access Level entry   [place] Two level   Alternate Level Stairs-Number of Steps 15   Alternate Level Stairs-Rails Left   Home Equipment None   Prior Function   Level of Independence Independent   Vocation On disability   Cognition   Overall Cognitive Status Within Functional Limits for  tasks assessed   Sensation   Light Touch Appears Intact   Coordination   Gross Motor Movements are Fluid and Coordinated Yes       PAIN: 4/10  POSTURE: Rounded shoulders and increased kyphosis in sitting.   AROM: Cervical, lumbar and thoracic WFL and no increase in pain with AROM  STRENGTH:  Graded on a 0-5 scale Muscle Group Left Right  Mid trap 3+ 3+  Lower trap 3+ 2+  Gross grip strength Equal bilaterally                 SENSATION: UE WNL to light touch    FUNCTIONAL MOBILITY: Pt is able to perform bed mobility, transfers and ambulate independently   BALANCE: Pt is steady throughout session and does not experience any LOB  GAIT: Pt ambulates with decreased trunk rotation, reciprocal gait pattern, upright posture,  Pt experienced mid back pain ~1000 ft with "tight band sensation"  Which decreased after resting in prone Pt had increased breathing rate after 6 min and had to pause between during conversation to catch her breath and reported history of agina but not after 6 min walk test  Palpation: [name] thoracic spine is hypomobile and tender to palpation, especially T3-4, T6-7 which reproduced her mid back pain symptoms [name] ribs are also tender to palpation and no apparent deviations    OUTCOME MEASURES: TEST Outcome Interpretation  6 minute walk test         1200       Feet 1000 feet is Corporate investment banker after 6 min walk test:  99% O2 80 bpm 134/54 mmHg There ex: Pt educated on plan of care, using lumbar roll to improve posture and decrease stress on spine.                       PT Education - 10/07/15 1000    Education provided Yes   Education Details plan of care and using lumbar roll to improve posture and decrease pain    Person(s) Educated Patient   Methods Explanation   Comprehension Verbalized understanding             PT Long Term Goals - 10/07/15 1002    PT LONG TERM GOAL #1   Title pt will be  able to ambulate  at least 1765 ft during 6 min walk test with less than 2/10 pain, average distance for 57-48 year olds   Baseline pt ambulated 1200 ft in 6 min with increased mid back pain  starting around ~1000 ft    Time 4   Period Weeks   Status New   PT LONG TERM GOAL #2   Title pt's lower and mid trap strength will improve to at least 4/5 MMT to improve sitting posture to decrease pain in sitting   Baseline Less than 4/5 MMT bilaterally    Time 4   Period Weeks   Status New   PT LONG TERM GOAL #3   Title pt will be able to wash dishes with less than 2/10   Baseline pt notes household chores increases her mid back pain up to 7/10   Time 4   Period Weeks   Status New               Plan - 10-08-2015 1231    Clinical Impression Statement Pt is a pleasant 59 year old female with chronic (several years) history of upper and mid back pain that has worsen in the past few months.  Pt is able to perform functional movements but her mid back pain increases with extended walking and her breathing pattern will further be assessed next session to see if it is contributing her pain.  pt's thoracic is hypomobile and tender to palpation and T3-4 and T6-7 reproduce her pain symptoms.  Pt also presents with decreased periscapular strength and would benefit from strengthening to improve posture and decrease stress on spine.  Pt would benefit from skilled PT services to improve deficits to decrease mid back pain with functional movements and improve quality of life.     Pt will benefit from skilled therapeutic intervention in order to improve on the following deficits Decreased strength;Postural dysfunction;Hypomobility;Pain;Decreased activity tolerance   Rehab Potential Fair   Clinical Impairments Affecting Rehab Potential comorbidities   PT Frequency 2x / week   PT Duration 4 weeks   PT Treatment/Interventions Aquatic Therapy;Biofeedback;Cryotherapy;Electrical Stimulation;Moist Heat;Therapeutic  exercise;Therapeutic activities;Neuromuscular re-education;Manual techniques;Dry needling;Passive range of motion          G-Codes - 10-08-15 1404    Functional Assessment Tool Used 6 min walk test, history, clinical judgement   Functional Limitation Mobility: Walking and moving around   Mobility: Walking and Moving Around Current Status (T2458) At least 1 percent but less than 20 percent impaired, limited or restricted   Mobility: Walking and Moving Around Goal Status [phone]) At least 1 percent but less than 20 percent impaired, limited or restricted       Problem List Patient Active Problem List   Diagnosis Date Noted  . Change in vision 02/19/2015  . Chest pain 07/09/2013  . Cramping of feet 04/24/2013  . Other screening mammogram 01/15/2013  . Vertigo 08/18/2012  . Vitamin D deficiency disease 07/03/2012  . Nausea 05/24/2012  . Aortic dissection ([place]) 05/12/2012  . Anemia 05/12/2012  . Obesity 11/21/2011  . Unspecified vitamin D deficiency 10/19/2011  . Bronchitis 10/04/2011  . Routine general medical examination at a health care facility 09/20/2011  . Gynecological examination 09/20/2011  . Atypical chest pain 09/15/2011  . Congestive dilated cardiomyopathy ([place]) 02/04/2011  . UNSPECIFIED ANEMIA 01/18/2011  . S/P AVR (aortic valve replacement) 01/07/2011  . CORONARY ARTERY BYPASS GRAFT, HX OF 01/07/2011  . DIZZINESS 01/06/2011  . Chronic back pain 12/15/2010  . AAA 12/11/2010  . GALLBLADDER  DISEASE 12/11/2010  . IRRITABLE BOWEL SYNDROME 09/09/2009  . PERSISTENT DISORDER INITIATING/MAINTAINING SLEEP 08/12/2009  . Obstructive sleep apnea 08/04/2009  . EXTERNAL HEMORRHOIDS, WITH COMPLICATION 14/78/2956  . OSTEOPOROSIS 01/09/2009  . POSTMENOPAUSAL STATUS 12/02/2008  . INSOMNIA 04/02/2008  . Hyperlipidemia 07/26/2007  . Depression with anxiety 07/26/2007  . Essential hypertension 07/26/2007  . CORONARY ARTERY SPASM 07/26/2007  . DYSRHYTHMIA, CARDIAC NOS 07/26/2007   . ALLERGIC RHINITIS 07/26/2007  . ASTHMA 07/26/2007  . GERD 07/26/2007  . OSTEOARTHRITIS 07/26/2007  . MURMUR 07/26/2007  . MIGRAINES, HX OF 07/26/2007   Renford Dills, SPT This entire session was performed under direct supervision and direction of a licensed therapist/therapist assistant . I have personally read, edited and approve of the note as written. Gorden Harms. Tortorici, PT, DPT [phone]  Tortorici,Ashley 10/07/2015, 2:19 PM  [place] MAIN [facility] SERVICES [address] [place], [place], [address] Phone: [phone]   Fax:  [phone]  Name: Rachel Torres MRN: [id number] Date of Birth: 12-13-1956

## 2015-10-08 ENCOUNTER — Ambulatory Visit: Payer: Medicare HMO

## 2015-10-08 DIAGNOSIS — M549 Dorsalgia, unspecified: Secondary | ICD-10-CM | POA: Diagnosis not present

## 2015-10-08 NOTE — Patient Instructions (Signed)
[email or web address] Shoulder extension with red band 2x10 Shoulder rows with red band 2x10 Thoracic extension on chair 2x10

## 2015-10-09 NOTE — Therapy (Signed)
[place] MAIN [facility] SERVICES [address] [place], [place], [address] Phone: [phone]   Fax:  [phone]  Physical Therapy Treatment  Patient Details  Name: Rachel Torres MRN: [id number] Date of Birth: Jul 13, 1956 Referring Provider: Dr. Barbra Sarks   Encounter Date: 10/08/2015      PT End of Session - 10/08/15 1119    Visit Number 2   Number of Visits 9   Date for PT Re-Evaluation 11/03/15   Authorization Type 2/10 g codes   PT Start Time 0930   PT Stop Time 1015   PT Time Calculation (min) 45 min   Activity Tolerance Patient tolerated treatment well   Behavior During Therapy [facility] for tasks assessed/performed      Past Medical History  Diagnosis Date  . Allergy   . Asthma   . Anxiety   . Depression   . GERD (gastroesophageal reflux disease)   . Hyperlipidemia   . Hypertension   . Osteoporosis   . Gallbladder sludge   . AAA (abdominal aortic aneurysm) ([place])     with rupture and surgery  . Marfan syndrome   . RA (rheumatoid arthritis) (HCC)     per Dr. [name]  . Sleep apnea     Past Surgical History  Procedure Laterality Date  . Cesarean section  [place]  . Cardiac catheterization  2001  . Fracture surgery      shoulder replacement  . Orif distal radius fracture    . Abdominal aortic aneurysm repair    . Cholecystectomy      There were no vitals filed for this visit.  Visit Diagnosis:  Mid back pain      Subjective Assessment - 10/08/15 1705    Subjective Pt relates she is doing well and currently has 1-2/10 pain in her mid-back.     Patient Stated Goals to decrease pain and improve posture.     Currently in Pain? No/denies   Pain Score 2    Pain Location Back   Pain Orientation Mid      Manual therapy: Pt educated on the benefits of thoracic mobs Thoracic P/A mobs grade 2-3, 3x30 sec each level  Pt noted decreased tension with eeach bout and PT noticed improved thoracic mobility after  mobs.   Pt tender at T3-4 and T6-7 with minimal complaint UE symptoms  There ex:  followed by gait to assess pain after thoracic mobs,pt reported minimal change in back during gait after thoracic mobs Shoulder extension with red band 2x10 Shoulder rows with red band 2x10 Thoracic extension in chair 2x10 Pt required tactile cueing to decrease shoulder shrugging during exercises and verbal and visual cues for correct technique Pt noted decreased tension in her mid-back after thoracic extensions                          PT Education - 10/08/15 1118    Education provided Yes   Education Details plan of care, HEP and gait observations   Person(s) Educated Patient   Methods Explanation   Comprehension Verbalized understanding             PT Long Term Goals - 10/07/15 1002    PT LONG TERM GOAL #1   Title pt will be able to ambulate at least 1765 ft during 6 min walk test with less than 2/10 pain, average distance for 67-58 year olds   Baseline pt ambulated 1200 ft  in 6 min with increased mid back pain  starting around ~1000 ft    Time 4   Period Weeks   Status New   PT LONG TERM GOAL #2   Title pt's lower and mid trap strength will improve to at least 4/5 MMT to improve sitting posture to decrease pain in sitting   Baseline Less than 4/5 MMT bilaterally    Time 4   Period Weeks   Status New   PT LONG TERM GOAL #3   Title pt will be able to wash dishes with less than 2/10   Baseline pt notes household chores increases her mid back pain up to 7/10   Time 4   Period Weeks   Status New               Plan - 10/09/15 1007    Clinical Impression Statement pt experienced minimal increase in pain with thoracic mobs today and reported less tension/pain in her mid-back after manual therapy. pt noted similar intensity in pain during ambulation after manual therapy, ambulates with minimal right arm swing and minimal trunk rotation.  pt was able to perform  periscapular and back strengthening exercises without an increase in pain.  pt required cueing to decrease bilateral shoulder shrugging during exercises.     Pt will benefit from skilled therapeutic intervention in order to improve on the following deficits Decreased strength;Postural dysfunction;Hypomobility;Pain;Decreased activity tolerance   Rehab Potential Fair   Clinical Impairments Affecting Rehab Potential comorbidities   PT Frequency 2x / week   PT Duration 4 weeks   PT Treatment/Interventions Aquatic Therapy;Biofeedback;Cryotherapy;Electrical Stimulation;Moist Heat;Therapeutic exercise;Therapeutic activities;Neuromuscular re-education;Manual techniques;Dry needling;Passive range of motion   PT Next Visit Plan manual therapy and progress HEP        Problem List Patient Active Problem List   Diagnosis Date Noted  . Change in vision 02/19/2015  . Chest pain 07/09/2013  . Cramping of feet 04/24/2013  . Other screening mammogram 01/15/2013  . Vertigo 08/18/2012  . Vitamin D deficiency disease 07/03/2012  . Nausea 05/24/2012  . Aortic dissection ([place]) 05/12/2012  . Anemia 05/12/2012  . Obesity 11/21/2011  . Unspecified vitamin D deficiency 10/19/2011  . Bronchitis 10/04/2011  . Routine general medical examination at a health care facility 09/20/2011  . Gynecological examination 09/20/2011  . Atypical chest pain 09/15/2011  . Congestive dilated cardiomyopathy ([place]) 02/04/2011  . UNSPECIFIED ANEMIA 01/18/2011  . S/P AVR (aortic valve replacement) 01/07/2011  . CORONARY ARTERY BYPASS GRAFT, HX OF 01/07/2011  . DIZZINESS 01/06/2011  . Chronic back pain 12/15/2010  . AAA 12/11/2010  . GALLBLADDER DISEASE 12/11/2010  . IRRITABLE BOWEL SYNDROME 09/09/2009  . PERSISTENT DISORDER INITIATING/MAINTAINING SLEEP 08/12/2009  . Obstructive sleep apnea 08/04/2009  . EXTERNAL HEMORRHOIDS, WITH COMPLICATION 54/49/2010  . OSTEOPOROSIS 01/09/2009  . POSTMENOPAUSAL STATUS 12/02/2008  .  INSOMNIA 04/02/2008  . Hyperlipidemia 07/26/2007  . Depression with anxiety 07/26/2007  . Essential hypertension 07/26/2007  . CORONARY ARTERY SPASM 07/26/2007  . DYSRHYTHMIA, CARDIAC NOS 07/26/2007  . ALLERGIC RHINITIS 07/26/2007  . ASTHMA 07/26/2007  . GERD 07/26/2007  . OSTEOARTHRITIS 07/26/2007  . MURMUR 07/26/2007  . MIGRAINES, HX OF 07/26/2007   Renford Dills, SPT This entire session was performed under direct supervision and direction of a licensed therapist/therapist assistant . I have personally read, edited and approve of the note as written. Gorden Harms. Tortorici, PT, DPT #[id number]   Renford Dills 10/09/2015, 10:09 AM  [place] MAIN [facility] SERVICES [address]  [place], [place], [address] Phone: [phone]   Fax:  [phone]  Name: Rachel Torres MRN: [id number] Date of Birth: [birth year]

## 2015-10-14 ENCOUNTER — Ambulatory Visit: Payer: Medicare HMO

## 2015-10-14 DIAGNOSIS — M549 Dorsalgia, unspecified: Secondary | ICD-10-CM | POA: Diagnosis not present

## 2015-10-15 ENCOUNTER — Ambulatory Visit: Payer: Medicare HMO

## 2015-10-15 NOTE — Therapy (Signed)
[place] MAIN [facility] SERVICES [address] [place], [place], [address] Phone: [phone]   Fax:  [phone]  Physical Therapy Treatment  Patient Details  Name: Rachel Torres MRN: [id number] Date of Birth: [birth year] Referring Provider: Dr. Barbra Sarks   Encounter Date: 10/14/2015      PT End of Session - 10/15/15 0943    Visit Number 3   Number of Visits 9   Date for PT Re-Evaluation 11/03/15   Authorization Type 3/10 g codes   PT Start Time 1300   PT Stop Time 1345   PT Time Calculation (min) 45 min   Activity Tolerance Patient tolerated treatment well   Behavior During Therapy [facility] for tasks assessed/performed      Past Medical History  Diagnosis Date  . Allergy   . Asthma   . Anxiety   . Depression   . GERD (gastroesophageal reflux disease)   . Hyperlipidemia   . Hypertension   . Osteoporosis   . Gallbladder sludge   . AAA (abdominal aortic aneurysm) ([place])     with rupture and surgery  . Marfan syndrome   . RA (rheumatoid arthritis) (HCC)     per Dr. [name]  . Sleep apnea     Past Surgical History  Procedure Laterality Date  . Cesarean section  [place]  . Cardiac catheterization  2001  . Fracture surgery      shoulder replacement  . Orif distal radius fracture    . Abdominal aortic aneurysm repair    . Cholecystectomy      There were no vitals filed for this visit.  Visit Diagnosis:  Mid back pain      Subjective Assessment - 10/14/15 1556    Subjective Pt reports her MD will not perform surgery for her lipoma but suggested breast reduction in the future if symptoms do not improve.  Pt currently has no mid back pain but her shoulders are sore from doing her hair.  Pt was not able to perform her HEP since her last visit but will attempt it tonight.     Patient Stated Goals to decrease pain and improve posture.     Currently in Pain? No/denies   Pain Score 0-No pain     Manual  therapy: Assessed thoracic spinal and rib mobility and R thoracic transverse process and right ribs are hypomobile  R Thoracic unilateral  P/A mobs grade 2-3, 3x30 sec, T1-T8 R rib P/A mobs grade 2-3 3x30 sec, T3-T7 Pt noted decreased tension with each bout and PT noticed improved thoracic and rib mobility after mobs.    Pt tender at T3-4 and T6-7 with minimal complaint UE symptoms  There ex:  Shoulder extension with red band 1x10 Shoulder rows with red band 1x10 Pt required tactile cueing to decrease shoulder shrugging during exercises and verbal and visual cues for correct technique                          PT Education - 10/14/15 1556    Education provided Yes   Education Details plan of care, the benefits of spine and rib mobs   Person(s) Educated Patient   Methods Explanation   Comprehension Verbalized understanding             PT Long Term Goals - 10/07/15 1002    PT LONG TERM GOAL #1   Title pt will be able to ambulate at least 1765  ft during 6 min walk test with less than 2/10 pain, average distance for 23-4 year olds   Baseline pt ambulated 1200 ft in 6 min with increased mid back pain  starting around ~1000 ft    Time 4   Period Weeks   Status New   PT LONG TERM GOAL #2   Title pt's lower and mid trap strength will improve to at least 4/5 MMT to improve sitting posture to decrease pain in sitting   Baseline Less than 4/5 MMT bilaterally    Time 4   Period Weeks   Status New   PT LONG TERM GOAL #3   Title pt will be able to wash dishes with less than 2/10   Baseline pt notes household chores increases her mid back pain up to 7/10   Time 4   Period Weeks   Status New               Plan - 10/15/15 0944    Clinical Impression Statement Pt responded well to manual therapy with decreased tension and restriction in her mid-back.  At the end of the session, pt noted c/o of right upper trap tightness/pain and after and myofascial trigger  point restriction was palpated and will be further assessed during her next session.     Pt will benefit from skilled therapeutic intervention in order to improve on the following deficits Decreased strength;Postural dysfunction;Hypomobility;Pain;Decreased activity tolerance   Rehab Potential Fair   Clinical Impairments Affecting Rehab Potential comorbidities   PT Frequency 2x / week   PT Duration 4 weeks   PT Treatment/Interventions Aquatic Therapy;Biofeedback;Cryotherapy;Electrical Stimulation;Moist Heat;Therapeutic exercise;Therapeutic activities;Neuromuscular re-education;Manual techniques;Dry needling;Passive range of motion   PT Next Visit Plan manual therapy and progress HEP        Problem List Patient Active Problem List   Diagnosis Date Noted  . Change in vision 02/19/2015  . Chest pain 07/09/2013  . Cramping of feet 04/24/2013  . Other screening mammogram 01/15/2013  . Vertigo 08/18/2012  . Vitamin D deficiency disease 07/03/2012  . Nausea 05/24/2012  . Aortic dissection ([place]) 05/12/2012  . Anemia 05/12/2012  . Obesity 11/21/2011  . Unspecified vitamin D deficiency 10/19/2011  . Bronchitis 10/04/2011  . Routine general medical examination at a health care facility 09/20/2011  . Gynecological examination 09/20/2011  . Atypical chest pain 09/15/2011  . Congestive dilated cardiomyopathy ([place]) 02/04/2011  . UNSPECIFIED ANEMIA 01/18/2011  . S/P AVR (aortic valve replacement) 01/07/2011  . CORONARY ARTERY BYPASS GRAFT, HX OF 01/07/2011  . DIZZINESS 01/06/2011  . Chronic back pain 12/15/2010  . AAA 12/11/2010  . GALLBLADDER DISEASE 12/11/2010  . IRRITABLE BOWEL SYNDROME 09/09/2009  . PERSISTENT DISORDER INITIATING/MAINTAINING SLEEP 08/12/2009  . Obstructive sleep apnea 08/04/2009  . EXTERNAL HEMORRHOIDS, WITH COMPLICATION 32/67/1245  . OSTEOPOROSIS 01/09/2009  . POSTMENOPAUSAL STATUS 12/02/2008  . INSOMNIA 04/02/2008  . Hyperlipidemia 07/26/2007  . Depression with  anxiety 07/26/2007  . Essential hypertension 07/26/2007  . CORONARY ARTERY SPASM 07/26/2007  . DYSRHYTHMIA, CARDIAC NOS 07/26/2007  . ALLERGIC RHINITIS 07/26/2007  . ASTHMA 07/26/2007  . GERD 07/26/2007  . OSTEOARTHRITIS 07/26/2007  . MURMUR 07/26/2007  . MIGRAINES, HX OF 07/26/2007   Renford Dills, SPT This entire session was performed under direct supervision and direction of a licensed therapist/therapist assistant . I have personally read, edited and approve of the note as written. Gorden Harms. Tortorici, PT, DPT [phone]  Tortorici,Ashley 10/15/2015, 1:14 PM  [place] MAIN REHAB SERVICES  [place], [place], [address] Phone: [phone]   Fax:  [phone]  Name: Rachel Torres MRN: [id number] Date of Birth: 10/04/1956

## 2015-10-20 ENCOUNTER — Ambulatory Visit: Payer: Medicare HMO

## 2015-10-20 DIAGNOSIS — M549 Dorsalgia, unspecified: Secondary | ICD-10-CM | POA: Diagnosis not present

## 2015-10-20 NOTE — Therapy (Signed)
[place] MAIN [facility] SERVICES [address] [place], [place], [address] Phone: [phone]   Fax:  [phone]  Physical Therapy Treatment  Patient Details  Name: Rachel Torres MRN: [id number] Date of Birth: [birth year] Referring Provider: Dr. Barbra Sarks   Encounter Date: 10/20/2015      PT End of Session - 10/20/15 1527    Visit Number 4   Number of Visits 9   Date for PT Re-Evaluation 11/03/15   Authorization Type 4/10 g codes   PT Start Time 0930   PT Stop Time 1015   PT Time Calculation (min) 45 min   Activity Tolerance Patient tolerated treatment well   Behavior During Therapy [facility] for tasks assessed/performed      Past Medical History  Diagnosis Date  . Allergy   . Asthma   . Anxiety   . Depression   . GERD (gastroesophageal reflux disease)   . Hyperlipidemia   . Hypertension   . Osteoporosis   . Gallbladder sludge   . AAA (abdominal aortic aneurysm) ([place])     with rupture and surgery  . Marfan syndrome   . RA (rheumatoid arthritis) (HCC)     per Dr. [name]  . Sleep apnea     Past Surgical History  Procedure Laterality Date  . Cesarean section  [place]  . Cardiac catheterization  2001  . Fracture surgery      shoulder replacement  . Orif distal radius fracture    . Abdominal aortic aneurysm repair    . Cholecystectomy      There were no vitals filed for this visit.  Visit Diagnosis:  Mid back pain      Subjective Assessment - 10/20/15 1316    Subjective pt relates she had increased tension in her left upper trap and migraines over the weekend and thinks it is due to performing her HEP incorrectly.  She feels increased upper trap tension when performing scapular retractions.  [name] mid back felt better after her last session was able to ambulate and stand longer before experiencing pain.  Pt currently has pain in her left upper trap with 6/10 and denies any in her mid-back.     Patient Stated Goals  to decrease pain and improve posture.     Currently in Pain? Yes   Pain Score 6    Pain Location --  upper trap    Pain Orientation Left   Pain Descriptors / Indicators Aching;Tightness      Manual therapy: Soft tissue mobilization (effleurage and ischemic compression) to left upper trap/levator scapulae to decrease myofascial trigger point restrictions.   Decreased tension and pain noted after STM.  Suboccipital release x5 min, pt noted decreased tension Manual traction x2 min 15 sec on and 15 sec off Assessed thoracic spinal and rib mobility and R thoracic transverse process and right ribs are hypomobile   R Thoracic unilateral P/A mobs grade 2-3, 3x30 sec, T1-T8 R rib P/A mobs grade 2-3 3x30 sec, T3-T7 Pt noted decreased tension with each bout and PT noticed improved thoracic and rib mobility after mobs.    Pt tender at T3-4 and T6-7 with minimal complaint UE symptoms  There ex:   Left upper trap and levator scapulae stretch 3x30 sec each, pt required verbal and visual cueing for correct technique  PT Education - 10/20/15 1526    Education provided Yes   Education Details upper trap and levator scapulae stretches, moist heat and self soft tissue mobilizaiton before stretching    Person(s) Educated Patient   Methods Explanation   Comprehension Verbalized understanding             PT Long Term Goals - 10/07/15 1002    PT LONG TERM GOAL #1   Title pt will be able to ambulate at least 1765 ft during 6 min walk test with less than 2/10 pain, average distance for 84-97 year olds   Baseline pt ambulated 1200 ft in 6 min with increased mid back pain  starting around ~1000 ft    Time 4   Period Weeks   Status New   PT LONG TERM GOAL #2   Title pt's lower and mid trap strength will improve to at least 4/5 MMT to improve sitting posture to decrease pain in sitting   Baseline Less than 4/5 MMT bilaterally    Time 4   Period  Weeks   Status New   PT LONG TERM GOAL #3   Title pt will be able to wash dishes with less than 2/10   Baseline pt notes household chores increases her mid back pain up to 7/10   Time 4   Period Weeks   Status New               Plan - 10/20/15 1537    Clinical Impression Statement Pt responded well to manual therapy with decreased tension and restriction in her mid-back and decreased pain and tightness in her upper left trap.  Large myofascial trigger point was palpated in her upper trap/levator scapulae area and will benefit from continued manual therapy and there ex to address deficits to decrease restriction.  Discussed possible use of dry needling to address restriction.  At the end of the session, pt noted decreased trap tightness/pain and improved mid back mobility.     Pt will benefit from skilled therapeutic intervention in order to improve on the following deficits Decreased strength;Postural dysfunction;Hypomobility;Pain;Decreased activity tolerance   Rehab Potential Fair   Clinical Impairments Affecting Rehab Potential comorbidities   PT Frequency 2x / week   PT Duration 4 weeks   PT Treatment/Interventions Aquatic Therapy;Biofeedback;Cryotherapy;Electrical Stimulation;Moist Heat;Therapeutic exercise;Therapeutic activities;Neuromuscular re-education;Manual techniques;Dry needling;Passive range of motion   PT Next Visit Plan manual therapy and progress HEP        Problem List Patient Active Problem List   Diagnosis Date Noted  . Change in vision 02/19/2015  . Chest pain 07/09/2013  . Cramping of feet 04/24/2013  . Other screening mammogram 01/15/2013  . Vertigo 08/18/2012  . Vitamin D deficiency disease 07/03/2012  . Nausea 05/24/2012  . Aortic dissection ([place]) 05/12/2012  . Anemia 05/12/2012  . Obesity 11/21/2011  . Unspecified vitamin D deficiency 10/19/2011  . Bronchitis 10/04/2011  . Routine general medical examination at a health care facility 09/20/2011   . Gynecological examination 09/20/2011  . Atypical chest pain 09/15/2011  . Congestive dilated cardiomyopathy ([place]) 02/04/2011  . UNSPECIFIED ANEMIA 01/18/2011  . S/P AVR (aortic valve replacement) 01/07/2011  . CORONARY ARTERY BYPASS GRAFT, HX OF 01/07/2011  . DIZZINESS 01/06/2011  . Chronic back pain 12/15/2010  . AAA 12/11/2010  . GALLBLADDER DISEASE 12/11/2010  . IRRITABLE BOWEL SYNDROME 09/09/2009  . PERSISTENT DISORDER INITIATING/MAINTAINING SLEEP 08/12/2009  . Obstructive sleep apnea 08/04/2009  . EXTERNAL HEMORRHOIDS, WITH COMPLICATION 16/09/9603  . OSTEOPOROSIS 01/09/2009  .  POSTMENOPAUSAL STATUS 12/02/2008  . INSOMNIA 04/02/2008  . Hyperlipidemia 07/26/2007  . Depression with anxiety 07/26/2007  . Essential hypertension 07/26/2007  . CORONARY ARTERY SPASM 07/26/2007  . DYSRHYTHMIA, CARDIAC NOS 07/26/2007  . ALLERGIC RHINITIS 07/26/2007  . ASTHMA 07/26/2007  . GERD 07/26/2007  . OSTEOARTHRITIS 07/26/2007  . MURMUR 07/26/2007  . MIGRAINES, HX OF 07/26/2007   Renford Dills, SPT This entire session was performed under direct supervision and direction of a licensed therapist/therapist assistant . I have personally read, edited and approve of the note as written. Gorden Harms. Tortorici, PT, DPT [phone]  Tortorici,Ashley 10/21/2015, 11:07 AM  [employer] [employer] MAIN [facility] SERVICES [address] [place], [place], [address] Phone: [phone]   Fax:  [phone]  Name: BROOKELIN FELBER MRN: [id number] Date of Birth: [birth year]

## 2015-10-22 ENCOUNTER — Ambulatory Visit: Payer: Medicare HMO | Attending: Student

## 2015-10-22 DIAGNOSIS — M549 Dorsalgia, unspecified: Secondary | ICD-10-CM | POA: Diagnosis present

## 2015-10-22 NOTE — Therapy (Signed)
[place] MAIN [facility] SERVICES [address] [place], [place], [address] Phone: [phone]   Fax:  [phone]  Physical Therapy Treatment  Patient Details  Name: Rachel Torres MRN: [id number] Date of Birth: [birth year] Referring Provider: Dr. Barbra Sarks   Encounter Date: 10/22/2015      PT End of Session - 10/22/15 1540    Visit Number 5   Number of Visits 9   Date for PT Re-Evaluation 11/03/15   Authorization Type 5/10 g codes   PT Start Time 0930   PT Stop Time 1015   PT Time Calculation (min) 45 min   Activity Tolerance Patient tolerated treatment well   Behavior During Therapy [facility] for tasks assessed/performed      Past Medical History  Diagnosis Date  . Allergy   . Asthma   . Anxiety   . Depression   . GERD (gastroesophageal reflux disease)   . Hyperlipidemia   . Hypertension   . Osteoporosis   . Gallbladder sludge   . AAA (abdominal aortic aneurysm) ([place])     with rupture and surgery  . Marfan syndrome   . RA (rheumatoid arthritis) (HCC)     per Dr. [name]  . Sleep apnea     Past Surgical History  Procedure Laterality Date  . Cesarean section  [place]  . Cardiac catheterization  2001  . Fracture surgery      shoulder replacement  . Orif distal radius fracture    . Abdominal aortic aneurysm repair    . Cholecystectomy      There were no vitals filed for this visit.  Visit Diagnosis:  Mid back pain      Subjective Assessment - 10/22/15 1833    Subjective Pt currently has 2/10 pain in her upper left trap and denies any pain in her mid-back.  Pt did not experience pain in her back with extended walking this morning and feels like her mid-back pain is improving since the start of PT.      Patient Stated Goals to decrease pain and improve posture.     Pain Score 2    Pain Location --  upper trap   Pain Orientation Left      Manual therapy: Soft tissue mobilization (effleurage and ischemic  compression) to left upper trap/levator scapulae to decrease myofascial trigger point restrictions.  Decreased tension and pain noted after STM.  Assessed thoracic spinal and rib mobility and R thoracic transverse process and right ribs are hypomobile  R Thoracic unilateral P/A mobs grade 2-3, 3x30 sec, T1-T8 R rib P/A mobs grade 2-3 3x30 sec, T3-T7 Pt noted decreased tension with each bout and PT noticed improved thoracic and rib mobility after mobs.  Pt tender at T3-4 and T6-7 with minimal complaint UE symptoms  There ex:  Left upper trap and levator scapulae stretch 3x30 sec each, pt required verbal and visual cueing for correct technique Wall slides with lift x6 and deferred due to increased pain in her mid back Doorway frame stretch 3x30 sec each side, pt required cueing for correct technique                                      PT Education - 10/22/15 1539    Education provided Yes   Education Details doorway stretch added to HEP and to peform HEP within pain free range  Person(s) Educated Patient   Methods Explanation   Comprehension Verbalized understanding             PT Long Term Goals - 10/07/15 1002    PT LONG TERM GOAL #1   Title pt will be able to ambulate at least 1765 ft during 6 min walk test with less than 2/10 pain, average distance for 53-33 year olds   Baseline pt ambulated 1200 ft in 6 min with increased mid back pain  starting around ~1000 ft    Time 4   Period Weeks   Status New   PT LONG TERM GOAL #2   Title pt's lower and mid trap strength will improve to at least 4/5 MMT to improve sitting posture to decrease pain in sitting   Baseline Less than 4/5 MMT bilaterally    Time 4   Period Weeks   Status New   PT LONG TERM GOAL #3   Title pt will be able to wash dishes with less than 2/10   Baseline pt notes household chores increases her mid back pain up to 7/10   Time 4   Period Weeks   Status New                Plan - 10/22/15 1840    Clinical Impression Statement pt is responding well to PT and has been able to ambulate further with minimal to no pain in her mid-back recently.  Her myofascial trigger point restriction in her upper left trap/levator scapulae is improving and decreasing in size. Pt instructed to perform doorway frame stretch to decrease pec tightness.  Pt reported decreased pain and tension after treatment.     Pt will benefit from skilled therapeutic intervention in order to improve on the following deficits Decreased strength;Postural dysfunction;Hypomobility;Pain;Decreased activity tolerance   Rehab Potential Fair   Clinical Impairments Affecting Rehab Potential comorbidities   PT Frequency 2x / week   PT Duration 4 weeks   PT Treatment/Interventions Aquatic Therapy;Biofeedback;Cryotherapy;Electrical Stimulation;Moist Heat;Therapeutic exercise;Therapeutic activities;Neuromuscular re-education;Manual techniques;Dry needling;Passive range of motion   PT Next Visit Plan manual therapy and progress HEP        Problem List Patient Active Problem List   Diagnosis Date Noted  . Change in vision 02/19/2015  . Chest pain 07/09/2013  . Cramping of feet 04/24/2013  . Other screening mammogram 01/15/2013  . Vertigo 08/18/2012  . Vitamin D deficiency disease 07/03/2012  . Nausea 05/24/2012  . Aortic dissection ([place]) 05/12/2012  . Anemia 05/12/2012  . Obesity 11/21/2011  . Unspecified vitamin D deficiency 10/19/2011  . Bronchitis 10/04/2011  . Routine general medical examination at a health care facility 09/20/2011  . Gynecological examination 09/20/2011  . Atypical chest pain 09/15/2011  . Congestive dilated cardiomyopathy ([place]) 02/04/2011  . UNSPECIFIED ANEMIA 01/18/2011  . S/P AVR (aortic valve replacement) 01/07/2011  . CORONARY ARTERY BYPASS GRAFT, HX OF 01/07/2011  . DIZZINESS 01/06/2011  . Chronic back pain 12/15/2010  . AAA 12/11/2010  . GALLBLADDER  DISEASE 12/11/2010  . IRRITABLE BOWEL SYNDROME 09/09/2009  . PERSISTENT DISORDER INITIATING/MAINTAINING SLEEP 08/12/2009  . Obstructive sleep apnea 08/04/2009  . EXTERNAL HEMORRHOIDS, WITH COMPLICATION 01/77/9390  . OSTEOPOROSIS 01/09/2009  . POSTMENOPAUSAL STATUS 12/02/2008  . INSOMNIA 04/02/2008  . Hyperlipidemia 07/26/2007  . Depression with anxiety 07/26/2007  . Essential hypertension 07/26/2007  . CORONARY ARTERY SPASM 07/26/2007  . DYSRHYTHMIA, CARDIAC NOS 07/26/2007  . ALLERGIC RHINITIS 07/26/2007  . ASTHMA 07/26/2007  . GERD 07/26/2007  . OSTEOARTHRITIS 07/26/2007  .  MURMUR 07/26/2007  . MIGRAINES, HX OF 07/26/2007   Renford Dills, SPT This entire session was performed under direct supervision and direction of a licensed therapist/therapist assistant . I have personally read, edited and approve of the note as written. Gorden Harms. Tortorici, PT, DPT [phone]  Tortorici,Ashley 10/23/2015, 9:32 AM  [place] MAIN [facility] SERVICES [address] [place], [place], [address] Phone: [phone]   Fax:  [phone]  Name: SANJA ELIZARDO MRN: [id number] Date of Birth: 04-Dec-1956

## 2015-10-24 ENCOUNTER — Ambulatory Visit (INDEPENDENT_AMBULATORY_CARE_PROVIDER_SITE_OTHER): Payer: Medicare HMO | Admitting: Cardiovascular Disease

## 2015-10-24 ENCOUNTER — Encounter: Payer: Self-pay | Admitting: Cardiovascular Disease

## 2015-10-24 VITALS — BP 122/80 | HR 69 | Ht 65.0 in | Wt 205.5 lb

## 2015-10-24 DIAGNOSIS — I7103 Dissection of thoracoabdominal aorta: Secondary | ICD-10-CM | POA: Diagnosis not present

## 2015-10-24 DIAGNOSIS — Z952 Presence of prosthetic heart valve: Secondary | ICD-10-CM

## 2015-10-24 DIAGNOSIS — I10 Essential (primary) hypertension: Secondary | ICD-10-CM

## 2015-10-24 DIAGNOSIS — Z951 Presence of aortocoronary bypass graft: Secondary | ICD-10-CM | POA: Diagnosis not present

## 2015-10-24 DIAGNOSIS — I420 Dilated cardiomyopathy: Secondary | ICD-10-CM

## 2015-10-24 DIAGNOSIS — E785 Hyperlipidemia, unspecified: Secondary | ICD-10-CM

## 2015-10-24 DIAGNOSIS — Z954 Presence of other heart-valve replacement: Secondary | ICD-10-CM

## 2015-10-24 MED ORDER — FUROSEMIDE 20 MG PO TABS: 20.0000 mg | ORAL_TABLET | Freq: Two times a day (BID) | ORAL | Status: DC | PRN

## 2015-10-24 MED ORDER — NITROGLYCERIN 0.4 MG SL SUBL: 0.4000 mg | SUBLINGUAL_TABLET | SUBLINGUAL | Status: DC | PRN

## 2015-10-24 MED ORDER — POTASSIUM CHLORIDE ER 10 MEQ PO TBCR: 10.0000 meq | EXTENDED_RELEASE_TABLET | Freq: Two times a day (BID) | ORAL | Status: DC | PRN

## 2015-10-24 MED ORDER — MAGNESIUM GLUCONATE 500 (27 MG) MG PO TABS: 1.0000 | ORAL_TABLET | Freq: Every day | ORAL | Status: DC

## 2015-10-24 NOTE — Assessment & Plan Note (Signed)
Blood pressure is well controlled on today's visit. No changes made to the medications. 

## 2015-10-24 NOTE — Patient Instructions (Addendum)
You are doing well. No medication changes were made.  We will check lipids and LFT next week , fasting  Please call us if you have new issues that need to be addressed before your next appt.  Your physician wants you to follow-up in: 6 months.  You will receive a reminder letter in the mail two months in advance. If you don't receive a letter, please call our office to schedule the follow-up appointment.

## 2015-10-24 NOTE — Assessment & Plan Note (Signed)
Currently with no symptoms of angina. No further workup at this time. Continue current medication regimen. 

## 2015-10-24 NOTE — Assessment & Plan Note (Signed)
We'll repeat lipid panel next week, goal total cholesterol less than 150

## 2015-10-24 NOTE — Assessment & Plan Note (Signed)
Monitored by [facility] vascular surgery Periodic MRI/MRA

## 2015-10-24 NOTE — Assessment & Plan Note (Signed)
Recent echocardiogram with well-seated aortic valve.

## 2015-10-24 NOTE — Assessment & Plan Note (Signed)
Ejection fraction up to 40-45% on recent echocardiogram She takes Lasix daily, occasionally with extra Lasix after lunch

## 2015-10-24 NOTE — Progress Notes (Signed)
Patient ID: Rachel Torres, female    DOB: 12-19-1956, 59 y.o.   MRN: [id number]  HPI Comments: [name] is a very pleasant 59 year-old woman who suffered an aorta type A dissection in September 2011 who was rushed to [facility] and underwent aortic valve replacement, bypass of the right coronary artery with venous donor site from her right thigh, aortic grafting for a extensive dissection of the  ascending aorta, residual descending aortic dissection extending into the iliac artery, with chronic back pain, previous  symptoms of shortness of breath, abdominal pain, hypertension and malaise.  She reports pathology of her aortic valve showed bicuspid valve She presents today for follow-up of her aortic valve replacement and CABG.   in follow-up, she reports that she is doing well No chest pain, shortness of breath She is participating in physical therapy Has chronic back pain No regular exercise program, weight has been trending upwards Has periodic MRA at [facility] aortic root has been stable No significant lower extremity edema On Lipitor she has not had any muscle pain, previously had myalgias on pravastatin  Echocardiogram May 2016 shows slightly improved cardiac function, ejection fraction 40-45%.   EKG shows normal sinus rhythm with rate 66 bpm, nonspecific ST abnormality  Other past medical history diagnosed with obstructive sleep apnea. She has a CPAP machine but she is not using it.  Previous evaluations in the emergency room for chest pain. Cardiac enzymes were negative , EKG unchanged .She had a CT scan dated 06/20/2013. She was transferred to [facility] given her complicated anatomy. images were evaluated by vascular surgery it was determined there have been no significant progression of her disease  Previous evaluation at [facility] for shortness of breath and chest discomfort. She had workup for cardiac issues including echocardiogram and PET stress, CTA of the chest showing stable  descending aorta repair with type B. aortic dissection with opacification of the false lumen at the level of the renal arteries via a lumbar artery.  Echocardiogram showed ejection fraction had improved to 45-50%, mild MR, TR (ejection fraction improved from 30-35% in January 2012)  Previous H/o abdominal pain, further workup showing biliary stones and sludge. s/p  laparoscopic cholecystectomy at [facility].  She takes Lasix for urinary retention.   Allergies  Allergen Reactions  . Alendronate Sodium     REACTION: GI  . Aspirin     REACTION: bruising  . Hydrocod Polst-Cpm Polst Er Itching  . Hydrocodone-Acetaminophen     REACTION: itching  . Oxycodone     Itching   . Peanut-Containing Drug Products     REACTION: migraine  . Prednisone     REACTION: rash    Outpatient Encounter Prescriptions as of 10/24/2015  Medication Sig  . albuterol (PROVENTIL HFA;VENTOLIN HFA) 108 (90 BASE) MCG/ACT inhaler Inhale 2 puffs into the lungs every 4 (four) hours as needed.  . ALPRAZolam (XANAX) 0.5 MG tablet Take 1 tablet (0.5 mg total) by mouth at bedtime as needed for anxiety or sleep.  Marland Kitchen aspirin 81 MG tablet Take 81 mg by mouth daily.    Marland Kitchen atorvastatin (LIPITOR) 20 MG tablet Take 1 tablet (20 mg total) by mouth daily.  . carvedilol (COREG) 12.5 MG tablet TAKE 1 TABLET TWICE DAILY  WITH  A  MEAL  . Cholecalciferol (VITAMIN D3) 1000 UNITS CAPS Take 2,000 Units by mouth daily.   . clonazePAM (KLONOPIN) 0.5 MG tablet Take 0.5 mg by mouth 2 (two) times daily as needed for anxiety.  Marland Kitchen  fluticasone (FLONASE) 50 MCG/ACT nasal spray Place 2 sprays into the nose as needed.   Marland Kitchen lisinopril (PRINIVIL,ZESTRIL) 5 MG tablet Take 0.5 tablets (2.5 mg total) by mouth daily.  . meclizine (ANTIVERT) 12.5 MG tablet Take 12.5 mg by mouth as needed.  . nitroGLYCERIN (NITROSTAT) 0.4 MG SL tablet Place 1 tablet (0.4 mg total) under the tongue every 5 (five) minutes as needed for chest pain.  Marland Kitchen omeprazole (PRILOSEC) 40 MG capsule  Take 1 capsule (40 mg total) by mouth daily.  . promethazine (PHENERGAN) 25 MG tablet Take 0.5 tablets (12.5 mg total) by mouth every 8 (eight) hours as needed for nausea.  . ranitidine (ZANTAC) 150 MG capsule Take 1 capsule (150 mg total) by mouth 2 (two) times daily.  . sodium chloride (OCEAN) 0.65 % nasal spray Place 1 spray into the nose 4 (four) times daily as needed for congestion.  Marland Kitchen Spacer/Aero-Holding Chambers (POCKET SPACER) DEVI Use with albuterol  . sucralfate (CARAFATE) 1 G tablet Take 1 tablet (1 g total) by mouth 4 (four) times daily.  . traMADol (ULTRAM) 50 MG tablet 1-2 tablets by mouth up to every 6 hours as needed for pain  . [DISCONTINUED] nitroGLYCERIN (NITROSTAT) 0.4 MG SL tablet Place 1 tablet (0.4 mg total) under the tongue every 5 (five) minutes as needed for chest pain.  . [DISCONTINUED] nitroGLYCERIN (NITROSTAT) 0.4 MG SL tablet Place 1 tablet (0.4 mg total) under the tongue every 5 (five) minutes as needed for chest pain.  . furosemide (LASIX) 20 MG tablet Take 1 tablet (20 mg total) by mouth 2 (two) times daily as needed.  . Magnesium Gluconate (MAGONATE) 500 (27 MG) MG TABS Take 1 tablet by mouth daily.  . potassium chloride (K-DUR) 10 MEQ tablet Take 1 tablet (10 mEq total) by mouth 2 (two) times daily as needed.  . [DISCONTINUED] furosemide (LASIX) 20 MG tablet Take 1 tablet (20 mg total) by mouth 2 (two) times daily as needed.  . [DISCONTINUED] magnesium gluconate (MAGONATE) 500 MG tablet Take 500 mg by mouth daily.  . [DISCONTINUED] Potassium Aminobenzoate 500 MG TABS Take 500 mg by mouth daily.   No facility-administered encounter medications on file as of 10/24/2015.    Past Medical History  Diagnosis Date  . Allergy   . Asthma   . Anxiety   . Depression   . GERD (gastroesophageal reflux disease)   . Hyperlipidemia   . Hypertension   . Osteoporosis   . Gallbladder sludge   . AAA (abdominal aortic aneurysm) ([place])     with rupture and surgery  . Marfan  syndrome   . RA (rheumatoid arthritis) (HCC)     per Dr. [name]  . Sleep apnea     Past Surgical History  Procedure Laterality Date  . Cesarean section  [place]  . Cardiac catheterization  2001  . Fracture surgery      shoulder replacement  . Orif distal radius fracture    . Abdominal aortic aneurysm repair    . Cholecystectomy      Social History  reports that she has quit smoking. She has never used smokeless tobacco. She reports that she drinks alcohol. She reports that she does not use illicit drugs.  Family History family history includes Aneurysm in her paternal grandmother; Arthritis in her sister; Depression in her mother; Hypertension in her mother; Nephrolithiasis in her sister; Osteoporosis in her mother and sister; Stroke in her mother.   Review of Systems  Constitutional: Negative.  Respiratory: Negative.   Cardiovascular: Negative.   Gastrointestinal: Negative.   Musculoskeletal: Positive for back pain.  Skin: Negative.   Neurological: Negative.        Memory problems.  Hematological: Negative.   Psychiatric/Behavioral: Negative.   All other systems reviewed and are negative.  BP 122/80 mmHg  Pulse 69  Ht 5\' 5"  (1.651 m)  Wt 205 lb 8 oz (93.214 kg)  BMI 34.20 kg/m2  Physical Exam  Constitutional: She is oriented to person, place, and time. She appears well-developed and well-nourished.  HENT:  Head: Normocephalic.  Nose: Nose normal.  Mouth/Throat: Oropharynx is clear and moist.  Eyes: Conjunctivae are normal. Pupils are equal, round, and reactive to light.  Neck: Normal range of motion. Neck supple. No JVD present.  Cardiovascular: Normal rate, regular rhythm, S1 normal, S2 normal and intact distal pulses.  Exam reveals no gallop and no friction rub.   Murmur heard.  Crescendo systolic murmur is present with a grade of 2/6  Decreased right radial pulse, left lower extremity pulses  Pulmonary/Chest: Effort normal and breath sounds normal. No  respiratory distress. She has no wheezes. She has no rales. She exhibits no tenderness.  Well-healed mediastinal scar  Abdominal: Soft. Bowel sounds are normal. She exhibits no distension. There is no tenderness.  Musculoskeletal: Normal range of motion. She exhibits no edema or tenderness.  Lymphadenopathy:    She has no cervical adenopathy.  Neurological: She is alert and oriented to person, place, and time. Coordination normal.  Skin: Skin is warm and dry. No rash noted. No erythema.  Psychiatric: She has a normal mood and affect. Her behavior is normal. Judgment and thought content normal.    Assessment and Plan  Nursing note and vitals reviewed.

## 2015-10-27 ENCOUNTER — Other Ambulatory Visit: Payer: Self-pay

## 2015-10-27 ENCOUNTER — Ambulatory Visit: Payer: Medicare HMO

## 2015-10-27 ENCOUNTER — Other Ambulatory Visit (INDEPENDENT_AMBULATORY_CARE_PROVIDER_SITE_OTHER): Payer: Medicare HMO | Admitting: *Deleted

## 2015-10-27 DIAGNOSIS — M549 Dorsalgia, unspecified: Secondary | ICD-10-CM

## 2015-10-27 DIAGNOSIS — E785 Hyperlipidemia, unspecified: Secondary | ICD-10-CM

## 2015-10-27 NOTE — Therapy (Signed)
[place] MAIN [facility] SERVICES [address] [place], [place], [address] Phone: [phone]   Fax:  [phone]  Physical Therapy Treatment  Patient Details  Name: Rachel Torres MRN: [id number] Date of Birth: [birth year] Referring Provider: Dr. Barbra Sarks   Encounter Date: 10/27/2015      PT End of Session - 10/27/15 1146    Visit Number 6   Number of Visits 9   Date for PT Re-Evaluation 11/03/15   Authorization Type 6/10 g codes   PT Start Time 0930   PT Stop Time 1015   PT Time Calculation (min) 45 min   Activity Tolerance Patient tolerated treatment well   Behavior During Therapy [facility] for tasks assessed/performed      Past Medical History  Diagnosis Date  . Allergy   . Asthma   . Anxiety   . Depression   . GERD (gastroesophageal reflux disease)   . Hyperlipidemia   . Hypertension   . Osteoporosis   . Gallbladder sludge   . AAA (abdominal aortic aneurysm) ([place])     with rupture and surgery  . Marfan syndrome   . RA (rheumatoid arthritis) (HCC)     per Dr. [name]  . Sleep apnea     Past Surgical History  Procedure Laterality Date  . Cesarean section  [place]  . Cardiac catheterization  2001  . Fracture surgery      shoulder replacement  . Orif distal radius fracture    . Abdominal aortic aneurysm repair    . Cholecystectomy      There were no vitals filed for this visit.  Visit Diagnosis:  Mid back pain      Subjective Assessment - 10/27/15 1144    Subjective pt is concerned about having 5 migraines in the past 5 weeks and will setting an appointment with her MD for an exam.  Pt had an "ocular migraine" over the weekend on her left eye that resolved within a few hours.  Pt relates her upper back and upper left neck pain are about the same but feels less tension in her upper left neck.  pt has been performing her HEP and feels relief with doorframe stretch.  Pt denies any pain in her mid back currently and  2/10 in her upper left neck/trap    Patient Stated Goals to decrease pain and improve posture.     Currently in Pain? Yes   Pain Score 2    Pain Location Neck   Pain Orientation Left      Manual therapy: Soft tissue mobilization (effleurage and ischemic compression) to left upper trap/levator scapulae to decrease myofascial trigger point restrictions in supine Decreased tension and pain noted after STM.  Assessed thoracic spinal and rib mobility and L thoracic transverse process  are hypomobile  L Thoracic unilateral P/A mobs grade 2-3, 3x30 sec, T1-T8 Pt noted decreased tension with each bout and PT noticed improved thoracic, rib mobility and decreased tension/discomfort in her right eye after mobs  Pt tender at T3-4 and T6-7 with minimal complaint UE symptoms Suboccipital release x5 min, pt reported decreased tension in her  Manual traction x5 min with 15 sec pull/10 sec relax, pt noted decreased tension in her upper back   There ex:  Supine pec stretch with towel roll in her mid back 2x2 min, pt required cueing for positioning  PT Education - 10/27/15 1145    Education provided Yes   Education Details plan of care, supine pec stretch and TP mobs on left side   Person(s) Educated Patient   Methods Explanation   Comprehension Verbalized understanding             PT Long Term Goals - 10/07/15 1002    PT LONG TERM GOAL #1   Title pt will be able to ambulate at least 1765 ft during 6 min walk test with less than 2/10 pain, average distance for 50-75 year olds   Baseline pt ambulated 1200 ft in 6 min with increased mid back pain  starting around ~1000 ft    Time 4   Period Weeks   Status New   PT LONG TERM GOAL #2   Title pt's lower and mid trap strength will improve to at least 4/5 MMT to improve sitting posture to decrease pain in sitting   Baseline Less than 4/5 MMT bilaterally    Time 4   Period Weeks   Status New    PT LONG TERM GOAL #3   Title pt will be able to wash dishes with less than 2/10   Baseline pt notes household chores increases her mid back pain up to 7/10   Time 4   Period Weeks   Status New               Plan - 10/27/15 1206    Clinical Impression Statement Today's session consisted mainly of manual therapy to improve left sided thoracic mobility. [name] left eye pain decreased with TP mobs and decreased tension was noted after manual therapy.   [name] presents with decreased left upper trap myofascial trigger point restriction and tension in her suboccipitals muscles.  Pt was instructed on supine pec stretch to decrease rounded shoulders posture .     Pt will benefit from skilled therapeutic intervention in order to improve on the following deficits Decreased strength;Postural dysfunction;Hypomobility;Pain;Decreased activity tolerance   Rehab Potential Fair   Clinical Impairments Affecting Rehab Potential comorbidities   PT Frequency 2x / week   PT Duration 4 weeks   PT Treatment/Interventions Aquatic Therapy;Biofeedback;Cryotherapy;Electrical Stimulation;Moist Heat;Therapeutic exercise;Therapeutic activities;Neuromuscular re-education;Manual techniques;Dry needling;Passive range of motion   PT Next Visit Plan manual therapy and progress HEP        Problem List Patient Active Problem List   Diagnosis Date Noted  . Change in vision 02/19/2015  . Chest pain 07/09/2013  . Cramping of feet 04/24/2013  . Other screening mammogram 01/15/2013  . Vertigo 08/18/2012  . Vitamin D deficiency disease 07/03/2012  . Nausea 05/24/2012  . Aortic dissection ([place]) 05/12/2012  . Anemia 05/12/2012  . Obesity 11/21/2011  . Unspecified vitamin D deficiency 10/19/2011  . Bronchitis 10/04/2011  . Routine general medical examination at a health care facility 09/20/2011  . Gynecological examination 09/20/2011  . Atypical chest pain 09/15/2011  . Congestive dilated cardiomyopathy ([place])  02/04/2011  . UNSPECIFIED ANEMIA 01/18/2011  . S/P AVR (aortic valve replacement) 01/07/2011  . CORONARY ARTERY BYPASS GRAFT, HX OF 01/07/2011  . DIZZINESS 01/06/2011  . Chronic back pain 12/15/2010  . AAA 12/11/2010  . GALLBLADDER DISEASE 12/11/2010  . IRRITABLE BOWEL SYNDROME 09/09/2009  . PERSISTENT DISORDER INITIATING/MAINTAINING SLEEP 08/12/2009  . Obstructive sleep apnea 08/04/2009  . EXTERNAL HEMORRHOIDS, WITH COMPLICATION 85/88/5027  . OSTEOPOROSIS 01/09/2009  . POSTMENOPAUSAL STATUS 12/02/2008  . INSOMNIA 04/02/2008  . Hyperlipidemia 07/26/2007  . Depression with anxiety 07/26/2007  . Essential  hypertension 07/26/2007  . CORONARY ARTERY SPASM 07/26/2007  . Cardiac dysrhythmia 07/26/2007  . ALLERGIC RHINITIS 07/26/2007  . ASTHMA 07/26/2007  . GERD 07/26/2007  . OSTEOARTHRITIS 07/26/2007  . MURMUR 07/26/2007  . MIGRAINES, HX OF 07/26/2007   Renford Dills, SPT This entire session was performed under direct supervision and direction of a licensed therapist/therapist assistant . I have personally read, edited and approve of the note as written. Gorden Harms. Tortorici, PT, DPT [phone]   Tortorici,Ashley 10/27/2015, 6:15 PM  [place] MAIN [facility] SERVICES [address] [place], [place], [address] Phone: [phone]   Fax:  [phone]  Name: Rachel Torres MRN: [id number] Date of Birth: Jan 01, 1956

## 2015-10-28 LAB — HEPATIC FUNCTION PANEL
ALT: 15 IU/L (ref 0–32)
AST: 16 IU/L (ref 0–40)
Albumin: 4 g/dL (ref 3.5–5.5)
Alkaline Phosphatase: 75 IU/L (ref 39–117)
Bilirubin Total: 0.4 mg/dL (ref 0.0–1.2)
Bilirubin, Direct: 0.09 mg/dL (ref 0.00–0.40)
Total Protein: 6.6 g/dL (ref 6.0–8.5)

## 2015-10-28 LAB — LIPID PANEL
Chol/HDL Ratio: 3.1 ratio units (ref 0.0–4.4)
Cholesterol, Total: 144 mg/dL (ref 100–199)
HDL: 46 mg/dL (ref >39)
LDL Calculated: 81 mg/dL (ref 0–99)
Triglycerides: 87 mg/dL (ref 0–149)
VLDL Cholesterol Cal: 17 mg/dL (ref 5–40)

## 2015-11-05 ENCOUNTER — Emergency Department
Admission: EM | Admit: 2015-11-05 | Discharge: 2015-11-05 | Disposition: A | Discharge status: Home / Self Care | Payer: Medicare HMO | Attending: Emergency Medicine | Admitting: Emergency Medicine

## 2015-11-05 ENCOUNTER — Encounter: Payer: Self-pay | Admitting: Emergency Medicine

## 2015-11-05 DIAGNOSIS — G43801 Other migraine, not intractable, with status migrainosus: Secondary | ICD-10-CM | POA: Diagnosis not present

## 2015-11-05 DIAGNOSIS — Z87891 Personal history of nicotine dependence: Secondary | ICD-10-CM | POA: Insufficient documentation

## 2015-11-05 DIAGNOSIS — Z79899 Other long term (current) drug therapy: Secondary | ICD-10-CM | POA: Diagnosis not present

## 2015-11-05 DIAGNOSIS — I10 Essential (primary) hypertension: Secondary | ICD-10-CM | POA: Insufficient documentation

## 2015-11-05 DIAGNOSIS — R110 Nausea: Secondary | ICD-10-CM

## 2015-11-05 DIAGNOSIS — H53143 Visual discomfort, bilateral: Secondary | ICD-10-CM

## 2015-11-05 DIAGNOSIS — G43909 Migraine, unspecified, not intractable, without status migrainosus: Secondary | ICD-10-CM | POA: Diagnosis present

## 2015-11-05 DIAGNOSIS — Z7982 Long term (current) use of aspirin: Secondary | ICD-10-CM | POA: Diagnosis not present

## 2015-11-05 HISTORY — DX: Migraine, unspecified, not intractable, without status migrainosus: G43.909

## 2015-11-05 MED ORDER — METOCLOPRAMIDE HCL 5 MG PO TABS: 5.0000 mg | ORAL_TABLET | Freq: Three times a day (TID) | ORAL | Status: DC | PRN

## 2015-11-05 MED ORDER — DIPHENHYDRAMINE HCL 50 MG/ML IJ SOLN
25.0000 mg | Freq: Once | INTRAMUSCULAR | Status: AC
  Administered 2015-11-05: 25 mg via INTRAVENOUS
  Filled 2015-11-05: qty 1

## 2015-11-05 MED ORDER — HYDROMORPHONE HCL 1 MG/ML IJ SOLN
1.0000 mg | Freq: Once | INTRAMUSCULAR | Status: AC
  Administered 2015-11-05: 1 mg via INTRAVENOUS
  Filled 2015-11-05: qty 1

## 2015-11-05 MED ORDER — METOCLOPRAMIDE HCL 5 MG/ML IJ SOLN
10.0000 mg | Freq: Once | INTRAMUSCULAR | Status: AC
  Administered 2015-11-05: 10 mg via INTRAVENOUS
  Filled 2015-11-05: qty 2

## 2015-11-05 MED ORDER — SODIUM CHLORIDE 0.9 % IV BOLUS (SEPSIS)
1000.0000 mL | Freq: Once | INTRAVENOUS | Status: AC
  Administered 2015-11-05: 1000 mL via INTRAVENOUS

## 2015-11-05 MED ORDER — KETOROLAC TROMETHAMINE 30 MG/ML IJ SOLN
30.0000 mg | Freq: Once | INTRAMUSCULAR | Status: AC
  Administered 2015-11-05: 30 mg via INTRAVENOUS
  Filled 2015-11-05: qty 1

## 2015-11-05 MED ORDER — KETOROLAC TROMETHAMINE 10 MG PO TABS: 10.0000 mg | ORAL_TABLET | Freq: Three times a day (TID) | ORAL | Status: DC | PRN

## 2015-11-05 NOTE — Discharge Instructions (Signed)
Please drink plenty of fluid to stay well hydrated. Please get plenty of rest. Please make a follow-up appointment your primary care physician.   Please return to the emergency department if you develop worsening headache, nausea or vomiting, fever, numbness, tingling, weakness, visual changes, or any other symptoms concerning to you.  Migraine Headache A migraine headache is an intense, throbbing pain on one or both sides of your head. A migraine can last for 30 minutes to several hours. CAUSES  The exact cause of a migraine headache is not always known. However, a migraine may be caused when nerves in the brain become irritated and release chemicals that cause inflammation. This causes pain. Certain things may also trigger migraines, such as:  Alcohol.  Smoking.  Stress.  Menstruation.  Aged cheeses.  Foods or drinks that contain nitrates, glutamate, aspartame, or tyramine.  Lack of sleep.  Chocolate.  Caffeine.  Hunger.  Physical exertion.  Fatigue.  Medicines used to treat chest pain (nitroglycerine), birth control pills, estrogen, and some blood pressure medicines. SIGNS AND SYMPTOMS  Pain on one or both sides of your head.  Pulsating or throbbing pain.  Severe pain that prevents daily activities.  Pain that is aggravated by any physical activity.  Nausea, vomiting, or both.  Dizziness.  Pain with exposure to bright lights, loud noises, or activity.  General sensitivity to bright lights, loud noises, or smells. Before you get a migraine, you may get warning signs that a migraine is coming (aura). An aura may include:  Seeing flashing lights.  Seeing bright spots, halos, or zigzag lines.  Having tunnel vision or blurred vision.  Having feelings of numbness or tingling.  Having trouble talking.  Having muscle weakness. DIAGNOSIS  A migraine headache is often diagnosed based on:  Symptoms.  Physical exam.  A CT scan or MRI of your head. These  imaging tests cannot diagnose migraines, but they can help rule out other causes of headaches. TREATMENT Medicines may be given for pain and nausea. Medicines can also be given to help prevent recurrent migraines.  HOME CARE INSTRUCTIONS  Only take over-the-counter or prescription medicines for pain or discomfort as directed by your health care provider. The use of long-term narcotics is not recommended.  Lie down in a dark, quiet room when you have a migraine.  Keep a journal to find out what may trigger your migraine headaches. For example, write down:  What you eat and drink.  How much sleep you get.  Any change to your diet or medicines.  Limit alcohol consumption.  Quit smoking if you smoke.  Get 7-9 hours of sleep, or as recommended by your health care provider.  Limit stress.  Keep lights dim if bright lights bother you and make your migraines worse. SEEK IMMEDIATE MEDICAL CARE IF:   Your migraine becomes severe.  You have a fever.  You have a stiff neck.  You have vision loss.  You have muscular weakness or loss of muscle control.  You start losing your balance or have trouble walking.  You feel faint or pass out.  You have severe symptoms that are different from your first symptoms. MAKE SURE YOU:   Understand these instructions.  Will watch your condition.  Will get help right away if you are not doing well or get worse.   This information is not intended to replace advice given to you by your health care provider. Make sure you discuss any questions you have with your health care provider.  Document Released: 12/06/2005 Document Revised: 12/27/2014 Document Reviewed: 08/13/2013 Elsevier Interactive Patient Education [employer].

## 2015-11-05 NOTE — ED Notes (Signed)
Migraine for 2 weeks, alert and oriented x4, photophobia , Nausea

## 2015-11-05 NOTE — ED Provider Notes (Signed)
[facility] Emergency Department Provider Note  ____________________________________________  Time seen: Approximately 2:13 PM  I have reviewed the triage vital signs and the nursing notes.   HISTORY  Chief Complaint Migraine    HPI Rachel Torres is a 59 y.o. female with a history of migraines presenting with 2 weeks of migraine. Patient states that over the past 2 weeks she has had mostly constant "throbbing" on the top of her head. Initially, the headache started on the right side, then moved over to the left, and now is on top of the head. She has associated nausea without vomiting, and denies any neck stiffness, rash, tick bites, trauma, fever, chills, numbness tingling or weakness. This headache is typical for her previous migraines except that it is not responding to Percocet and Toradol given to her as an outpatient which she usually takes in the setting of migraine.   Past Medical History  Diagnosis Date  . Allergy   . Asthma   . Anxiety   . Depression   . GERD (gastroesophageal reflux disease)   . Hyperlipidemia   . Hypertension   . Osteoporosis   . Gallbladder sludge   . AAA (abdominal aortic aneurysm) ([place])     with rupture and surgery  . Marfan syndrome   . RA (rheumatoid arthritis) ([facility])     per Dr. [name]  . Sleep apnea   . Migraine     Patient Active Problem List   Diagnosis Date Noted  . Change in vision 02/19/2015  . Chest pain 07/09/2013  . Cramping of feet 04/24/2013  . Other screening mammogram 01/15/2013  . Vertigo 08/18/2012  . Vitamin D deficiency disease 07/03/2012  . Nausea 05/24/2012  . Aortic dissection ([place]) 05/12/2012  . Anemia 05/12/2012  . Obesity 11/21/2011  . Unspecified vitamin D deficiency 10/19/2011  . Bronchitis 10/04/2011  . Routine general medical examination at a health care facility 09/20/2011  . Gynecological examination 09/20/2011  . Atypical chest pain 09/15/2011  . Congestive dilated  cardiomyopathy ([place]) 02/04/2011  . UNSPECIFIED ANEMIA 01/18/2011  . S/P AVR (aortic valve replacement) 01/07/2011  . CORONARY ARTERY BYPASS GRAFT, HX OF 01/07/2011  . DIZZINESS 01/06/2011  . Chronic back pain 12/15/2010  . AAA 12/11/2010  . GALLBLADDER DISEASE 12/11/2010  . IRRITABLE BOWEL SYNDROME 09/09/2009  . PERSISTENT DISORDER INITIATING/MAINTAINING SLEEP 08/12/2009  . Obstructive sleep apnea 08/04/2009  . EXTERNAL HEMORRHOIDS, WITH COMPLICATION [id number]  . OSTEOPOROSIS 01/09/2009  . POSTMENOPAUSAL STATUS 12/02/2008  . INSOMNIA 04/02/2008  . Hyperlipidemia 07/26/2007  . Depression with anxiety 07/26/2007  . Essential hypertension 07/26/2007  . CORONARY ARTERY SPASM 07/26/2007  . Cardiac dysrhythmia 07/26/2007  . ALLERGIC RHINITIS 07/26/2007  . ASTHMA 07/26/2007  . GERD 07/26/2007  . OSTEOARTHRITIS 07/26/2007  . MURMUR 07/26/2007  . MIGRAINES, HX OF 07/26/2007    Past Surgical History  Procedure Laterality Date  . Cesarean section  [place]  . Cardiac catheterization  2001  . Fracture surgery      shoulder replacement  . Orif distal radius fracture    . Abdominal aortic aneurysm repair    . Cholecystectomy      Current Outpatient Rx  Name  Route  Sig  Dispense  Refill  . albuterol (PROVENTIL HFA;VENTOLIN HFA) 108 (90 BASE) MCG/ACT inhaler   Inhalation   Inhale 2 puffs into the lungs every 4 (four) hours as needed.   1 Inhaler   3   . ALPRAZolam (XANAX) 0.5 MG tablet  Oral   Take 1 tablet (0.5 mg total) by mouth at bedtime as needed for anxiety or sleep.   30 tablet   1   . aspirin 81 MG tablet   Oral   Take 81 mg by mouth daily.           Marland Kitchen atorvastatin (LIPITOR) 20 MG tablet   Oral   Take 1 tablet (20 mg total) by mouth daily.   90 tablet   3   . carvedilol (COREG) 12.5 MG tablet      TAKE 1 TABLET TWICE DAILY  WITH  A  MEAL   180 tablet   3   . Cholecalciferol (VITAMIN D3) 1000 UNITS CAPS   Oral   Take 2,000 Units by mouth daily.           . clonazePAM (KLONOPIN) 0.5 MG tablet   Oral   Take 0.5 mg by mouth 2 (two) times daily as needed for anxiety.         . fluticasone (FLONASE) 50 MCG/ACT nasal spray   Nasal   Place 2 sprays into the nose as needed.          . furosemide (LASIX) 20 MG tablet   Oral   Take 1 tablet (20 mg total) by mouth 2 (two) times daily as needed.   180 tablet   3   . ketorolac (TORADOL) 10 MG tablet   Oral   Take 1 tablet (10 mg total) by mouth every 8 (eight) hours as needed for moderate pain (with food).   15 tablet   0   . lisinopril (PRINIVIL,ZESTRIL) 5 MG tablet   Oral   Take 0.5 tablets (2.5 mg total) by mouth daily.   45 tablet   3   . Magnesium Gluconate (MAGONATE) 500 (27 MG) MG TABS   Oral   Take 1 tablet by mouth daily.   90 tablet   4   . meclizine (ANTIVERT) 12.5 MG tablet   Oral   Take 12.5 mg by mouth as needed.         . metoCLOPramide (REGLAN) 5 MG tablet   Oral   Take 1 tablet (5 mg total) by mouth every 8 (eight) hours as needed for nausea.   15 tablet   0   . nitroGLYCERIN (NITROSTAT) 0.4 MG SL tablet   Sublingual   Place 1 tablet (0.4 mg total) under the tongue every 5 (five) minutes as needed for chest pain.   25 tablet   3   . omeprazole (PRILOSEC) 40 MG capsule   Oral   Take 1 capsule (40 mg total) by mouth daily.   30 capsule   5   . potassium chloride (K-DUR) 10 MEQ tablet   Oral   Take 1 tablet (10 mEq total) by mouth 2 (two) times daily as needed.   180 tablet   3   . promethazine (PHENERGAN) 25 MG tablet   Oral   Take 0.5 tablets (12.5 mg total) by mouth every 8 (eight) hours as needed for nausea.   30 tablet   1   . ranitidine (ZANTAC) 150 MG capsule   Oral   Take 1 capsule (150 mg total) by mouth 2 (two) times daily.   28 capsule   0   . sodium chloride (OCEAN) 0.65 % nasal spray   Nasal   Place 1 spray into the nose 4 (four) times daily as needed for congestion.         Marland Kitchen  Spacer/Aero-Holding Chambers  (POCKET SPACER) DEVI      Use with albuterol   1 each   0   . sucralfate (CARAFATE) 1 G tablet   Oral   Take 1 tablet (1 g total) by mouth 4 (four) times daily.   120 tablet   1   . traMADol (ULTRAM) 50 MG tablet      1-2 tablets by mouth up to every 6 hours as needed for pain   60 tablet   3     Allergies Alendronate sodium; Aspirin; Hydrocod polst-cpm polst er; Hydrocodone-acetaminophen; Oxycodone; Peanut-containing drug products; and Prednisone  Family History  Problem Relation Age of Onset  . Hypertension Mother   . Depression Mother   . Osteoporosis Mother   . Stroke Mother   . Arthritis Sister   . Aneurysm Paternal Grandmother   . Nephrolithiasis Sister   . Osteoporosis Sister     Social History Social History  Substance Use Topics  . Smoking status: Former Research scientist (life sciences)  . Smokeless tobacco: Never Used  . Alcohol Use: Yes     Comment: occassional    Review of Systems Constitutional: No fever/chills. No lightheadedness or syncope. No diaphoresis. No trauma. Eyes: No visual changes. No blurred or double vision. ENT: No sore throat. Cardiovascular: Denies chest pain, palpitations. Respiratory: Denies shortness of breath.  No cough. Gastrointestinal: No abdominal pain.  No nausea, no vomiting.  No diarrhea.  No constipation. Genitourinary: Negative for dysuria. Musculoskeletal: Negative for back pain. Skin: Negative for rash. Neurological: Positive for typical migraine symptoms. No numbness, tingling, weakness. No visual changes other than mild photophobia. No changes in gait. No changes in speech.  10-point ROS otherwise negative.  ____________________________________________   PHYSICAL EXAM:  VITAL SIGNS: ED Triage Vitals  Enc Vitals Group     BP 11/05/15 1057 107/49 mmHg     Pulse Rate 11/05/15 1057 67     Resp 11/05/15 1057 14     Temp 11/05/15 1057 98.2 F (36.8 C)     Temp Source 11/05/15 1057 Oral     SpO2 11/05/15 1057 95 %     Weight  11/05/15 1057 205 lb (92.987 kg)     Height 11/05/15 1057 5\' 5"  (1.651 m)     Head Cir --      Peak Flow --      Pain Score 11/05/15 1057 8     Pain Loc --      Pain Edu? --      Excl. in [place]? --     Constitutional: Patient is alert and oriented and lying comfortably in the stretcher. She is in no acute distress and is able to answer questions appropriately.  Eyes: Conjunctivae are normal.  EOMI. PERRLA LA. No nystagmus. Head: Atraumatic. No malocclusion or dental abnormality. Nose: No congestion/rhinnorhea. Mouth/Throat: Mucous membranes are moist.  Neck: No stridor.  Supple.  Full range of motion without pain. No meningismus. Cardiovascular: Normal rate, regular rhythm. No murmurs, rubs or gallops.  Respiratory: Normal respiratory effort.  No retractions. Lungs CTAB.  No wheezes, rales or ronchi. Gastrointestinal: Obese. Soft and nontender. No distention. No peritoneal signs. Musculoskeletal: No LE edema.  Neurologic:  Alert and oriented 3. Speech is clear. Face and smile are symmetric. EOMI and PERRLA. Good strength in all her extremities. Normal gait without ataxia. Skin:  Skin is warm, dry and intact. No rash noted. Psychiatric: Mood and affect are normal. Speech and behavior are normal.  Normal judgement.* ____________________________________________   Reva Bores (  all labs ordered are listed, but only abnormal results are displayed)  Labs Reviewed - No data to display ____________________________________________  EKG  Not indicated ____________________________________________  RADIOLOGY  No results found.  ____________________________________________   PROCEDURES  Procedure(s) performed: None  Critical Care performed: No ____________________________________________   INITIAL IMPRESSION / ASSESSMENT AND PLAN / ED COURSE  Pertinent labs & imaging results that were available during my care of the patient were reviewed by me and considered in my medical decision  making (see chart for details).  59 y.o. female with a history of migraines presenting with typical migraine that has been persistent despite outpatient treatment. She was sent here by her PMD for "migraine cocktail." On my exam, she has no focal neurologic deficits or signs of infection including meningitis. Subarachnoid hemorrhage is very unlikely. I'll plan to treat her headache symptomatically and reevaluate her  ----------------------------------------- 3:42 PM on 11/05/2015 -----------------------------------------  Patient migraine has completely resolved and she is feeling much better. Plan discharge. Discussed return precautions and follow-up instructions with the patient and her husband who verbalized understanding. ____________________________________________  FINAL CLINICAL IMPRESSION(S) / ED DIAGNOSES  Final diagnoses:  Other migraine with status migrainosus, not intractable  Photophobia of both eyes  Nausea without vomiting      NEW MEDICATIONS STARTED DURING THIS VISIT:  New Prescriptions   KETOROLAC (TORADOL) 10 MG TABLET    Take 1 tablet (10 mg total) by mouth every 8 (eight) hours as needed for moderate pain (with food).   METOCLOPRAMIDE (REGLAN) 5 MG TABLET    Take 1 tablet (5 mg total) by mouth every 8 (eight) hours as needed for nausea.     Eula Listen, MD 11/05/15 [phone]

## 2015-11-05 NOTE — ED Notes (Signed)
Migraine for 2 weeks.  Has been to dr twice --go shot of toradol and increase in percocet.  It has moved to other side of head.

## 2015-11-05 NOTE — ED Notes (Signed)
MD at bedside. Dr.Norman

## 2015-11-23 ENCOUNTER — Emergency Department
Admission: EM | Admit: 2015-11-23 | Discharge: 2015-11-23 | Disposition: A | Discharge status: Home / Self Care | Payer: Medicare HMO | Attending: Emergency Medicine | Admitting: Emergency Medicine

## 2015-11-23 ENCOUNTER — Encounter: Payer: Self-pay | Admitting: Emergency Medicine

## 2015-11-23 DIAGNOSIS — G8929 Other chronic pain: Secondary | ICD-10-CM | POA: Diagnosis not present

## 2015-11-23 DIAGNOSIS — Z7982 Long term (current) use of aspirin: Secondary | ICD-10-CM | POA: Diagnosis not present

## 2015-11-23 DIAGNOSIS — Z87891 Personal history of nicotine dependence: Secondary | ICD-10-CM | POA: Diagnosis not present

## 2015-11-23 DIAGNOSIS — M545 Low back pain, unspecified: Secondary | ICD-10-CM

## 2015-11-23 DIAGNOSIS — R350 Frequency of micturition: Secondary | ICD-10-CM | POA: Insufficient documentation

## 2015-11-23 DIAGNOSIS — Z79899 Other long term (current) drug therapy: Secondary | ICD-10-CM | POA: Insufficient documentation

## 2015-11-23 DIAGNOSIS — I10 Essential (primary) hypertension: Secondary | ICD-10-CM | POA: Insufficient documentation

## 2015-11-23 LAB — URINALYSIS COMPLETE WITH MICROSCOPIC (ARMC ONLY)
Bacteria, UA: NONE SEEN
Bilirubin Urine: NEGATIVE
Glucose, UA: NEGATIVE mg/dL
Hgb urine dipstick: NEGATIVE
Ketones, ur: NEGATIVE mg/dL
Leukocytes, UA: NEGATIVE
Nitrite: NEGATIVE
Protein, ur: NEGATIVE mg/dL
Specific Gravity, Urine: 1.009 (ref 1.005–1.030)
pH: 6 (ref 5.0–8.0)

## 2015-11-23 MED ORDER — TIZANIDINE HCL 4 MG PO TABS: 4.0000 mg | ORAL_TABLET | Freq: Three times a day (TID) | ORAL | Status: DC

## 2015-11-23 MED ORDER — KETOROLAC TROMETHAMINE 60 MG/2ML IM SOLN
60.0000 mg | Freq: Once | INTRAMUSCULAR | Status: AC
  Administered 2015-11-23: 60 mg via INTRAMUSCULAR
  Filled 2015-11-23: qty 2

## 2015-11-23 NOTE — ED Provider Notes (Signed)
[facility] Emergency Department Provider Note  ____________________________________________  Time seen: Approximately 8:48 AM  I have reviewed the triage vital signs and the nursing notes.   HISTORY  Chief Complaint Back Pain  HPI Rachel Torres is a 59 y.o. female who presents to the emergency department for evaluation of back pain. Pain started this morning. No relief with percocet. History of chronic back pain. Pain is generalized over the lower back. She denies dysuria but states that she's had frequent urination.   Past Medical History  Diagnosis Date  . Allergy   . Asthma   . Anxiety   . Depression   . GERD (gastroesophageal reflux disease)   . Hyperlipidemia   . Hypertension   . Osteoporosis   . Gallbladder sludge   . AAA (abdominal aortic aneurysm) ([place])     with rupture and surgery  . Marfan syndrome   . RA (rheumatoid arthritis) ([facility])     per Dr. [name]  . Sleep apnea   . Migraine     Patient Active Problem List   Diagnosis Date Noted  . Change in vision 02/19/2015  . Chest pain 07/09/2013  . Cramping of feet 04/24/2013  . Other screening mammogram 01/15/2013  . Vertigo 08/18/2012  . Vitamin D deficiency disease 07/03/2012  . Nausea 05/24/2012  . Aortic dissection ([place]) 05/12/2012  . Anemia 05/12/2012  . Obesity 11/21/2011  . Unspecified vitamin D deficiency 10/19/2011  . Bronchitis 10/04/2011  . Routine general medical examination at a health care facility 09/20/2011  . Gynecological examination 09/20/2011  . Atypical chest pain 09/15/2011  . Congestive dilated cardiomyopathy ([place]) 02/04/2011  . UNSPECIFIED ANEMIA 01/18/2011  . S/P AVR (aortic valve replacement) 01/07/2011  . CORONARY ARTERY BYPASS GRAFT, HX OF 01/07/2011  . DIZZINESS 01/06/2011  . Chronic back pain 12/15/2010  . AAA 12/11/2010  . GALLBLADDER DISEASE 12/11/2010  . IRRITABLE BOWEL SYNDROME 09/09/2009  . PERSISTENT DISORDER INITIATING/MAINTAINING  SLEEP 08/12/2009  . Obstructive sleep apnea 08/04/2009  . EXTERNAL HEMORRHOIDS, WITH COMPLICATION [id number]  . OSTEOPOROSIS 01/09/2009  . POSTMENOPAUSAL STATUS 12/02/2008  . INSOMNIA 04/02/2008  . Hyperlipidemia 07/26/2007  . Depression with anxiety 07/26/2007  . Essential hypertension 07/26/2007  . CORONARY ARTERY SPASM 07/26/2007  . Cardiac dysrhythmia 07/26/2007  . ALLERGIC RHINITIS 07/26/2007  . ASTHMA 07/26/2007  . GERD 07/26/2007  . OSTEOARTHRITIS 07/26/2007  . MURMUR 07/26/2007  . MIGRAINES, HX OF 07/26/2007    Past Surgical History  Procedure Laterality Date  . Cesarean section  [place]  . Cardiac catheterization  2001  . Fracture surgery      shoulder replacement  . Orif distal radius fracture    . Abdominal aortic aneurysm repair    . Cholecystectomy      Current Outpatient Rx  Name  Route  Sig  Dispense  Refill  . albuterol (PROVENTIL HFA;VENTOLIN HFA) 108 (90 BASE) MCG/ACT inhaler   Inhalation   Inhale 2 puffs into the lungs every 4 (four) hours as needed.   1 Inhaler   3   . ALPRAZolam (XANAX) 0.5 MG tablet   Oral   Take 1 tablet (0.5 mg total) by mouth at bedtime as needed for anxiety or sleep.   30 tablet   1   . aspirin 81 MG tablet   Oral   Take 81 mg by mouth daily.           Marland Kitchen atorvastatin (LIPITOR) 20 MG tablet   Oral   Take 1  tablet (20 mg total) by mouth daily.   90 tablet   3   . carvedilol (COREG) 12.5 MG tablet      TAKE 1 TABLET TWICE DAILY  WITH  A  MEAL   180 tablet   3   . Cholecalciferol (VITAMIN D3) 1000 UNITS CAPS   Oral   Take 2,000 Units by mouth daily.          . clonazePAM (KLONOPIN) 0.5 MG tablet   Oral   Take 0.5 mg by mouth 2 (two) times daily as needed for anxiety.         . fluticasone (FLONASE) 50 MCG/ACT nasal spray   Nasal   Place 2 sprays into the nose as needed.          . furosemide (LASIX) 20 MG tablet   Oral   Take 1 tablet (20 mg total) by mouth 2 (two) times daily as needed.    180 tablet   3   . ketorolac (TORADOL) 10 MG tablet   Oral   Take 1 tablet (10 mg total) by mouth every 8 (eight) hours as needed for moderate pain (with food).   15 tablet   0   . lisinopril (PRINIVIL,ZESTRIL) 5 MG tablet   Oral   Take 0.5 tablets (2.5 mg total) by mouth daily.   45 tablet   3   . Magnesium Gluconate (MAGONATE) 500 (27 MG) MG TABS   Oral   Take 1 tablet by mouth daily.   90 tablet   4   . meclizine (ANTIVERT) 12.5 MG tablet   Oral   Take 12.5 mg by mouth as needed.         . metoCLOPramide (REGLAN) 5 MG tablet   Oral   Take 1 tablet (5 mg total) by mouth every 8 (eight) hours as needed for nausea.   15 tablet   0   . nitroGLYCERIN (NITROSTAT) 0.4 MG SL tablet   Sublingual   Place 1 tablet (0.4 mg total) under the tongue every 5 (five) minutes as needed for chest pain.   25 tablet   3   . omeprazole (PRILOSEC) 40 MG capsule   Oral   Take 1 capsule (40 mg total) by mouth daily.   30 capsule   5   . potassium chloride (K-DUR) 10 MEQ tablet   Oral   Take 1 tablet (10 mEq total) by mouth 2 (two) times daily as needed.   180 tablet   3   . promethazine (PHENERGAN) 25 MG tablet   Oral   Take 0.5 tablets (12.5 mg total) by mouth every 8 (eight) hours as needed for nausea.   30 tablet   1   . ranitidine (ZANTAC) 150 MG capsule   Oral   Take 1 capsule (150 mg total) by mouth 2 (two) times daily.   28 capsule   0   . sodium chloride (OCEAN) 0.65 % nasal spray   Nasal   Place 1 spray into the nose 4 (four) times daily as needed for congestion.         Marland Kitchen Spacer/Aero-Holding Chambers (POCKET SPACER) DEVI      Use with albuterol   1 each   0   . sucralfate (CARAFATE) 1 G tablet   Oral   Take 1 tablet (1 g total) by mouth 4 (four) times daily.   120 tablet   1   . traMADol (ULTRAM) 50 MG tablet  1-2 tablets by mouth up to every 6 hours as needed for pain   60 tablet   3     Allergies Alendronate sodium; Aspirin; Hydrocod  polst-cpm polst er; Hydrocodone-acetaminophen; Oxycodone; Peanut-containing drug products; and Prednisone  Family History  Problem Relation Age of Onset  . Hypertension Mother   . Depression Mother   . Osteoporosis Mother   . Stroke Mother   . Arthritis Sister   . Aneurysm Paternal Grandmother   . Nephrolithiasis Sister   . Osteoporosis Sister     Social History Social History  Substance Use Topics  . Smoking status: Former Research scientist (life sciences)  . Smokeless tobacco: Never Used  . Alcohol Use: Yes     Comment: occassional    Review of Systems Constitutional: No recent illness. Eyes: No visual changes. ENT: No sore throat. Cardiovascular: Denies chest pain or palpitations. Respiratory: Denies shortness of breath. Gastrointestinal: No abdominal pain.  Genitourinary: Negative for dysuria. Musculoskeletal: Pain in lower back Skin: Negative for rash. Neurological: Negative for headaches, focal weakness or numbness. 10-point ROS otherwise negative.  ____________________________________________   PHYSICAL EXAM:  VITAL SIGNS: ED Triage Vitals  Enc Vitals Group     BP 11/23/15 0748 108/48 mmHg     Pulse Rate 11/23/15 0748 70     Resp 11/23/15 0748 20     Temp 11/23/15 0748 98.4 F (36.9 C)     Temp Source 11/23/15 0748 Oral     SpO2 11/23/15 0748 96 %     Weight 11/23/15 0748 208 lb (94.348 kg)     Height 11/23/15 0748 5\' 5"  (1.651 m)     Head Cir --      Peak Flow --      Pain Score 11/23/15 0741 8     Pain Loc --      Pain Edu? --      Excl. in [place]? --     Constitutional: Alert and oriented. Well appearing and in no acute distress. Eyes: Conjunctivae are normal. EOMI. Head: Atraumatic. Nose: No congestion/rhinnorhea. Neck: No stridor.  Respiratory: Normal respiratory effort.   Musculoskeletal: No focal midline tenderness of the lower back. No CVA tenderness. Patient able to complete active straight leg raise. Neurologic:  Normal speech and language. No gross focal  neurologic deficits are appreciated. Speech is normal. No gait instability. Skin:  Skin is warm, dry and intact. Atraumatic. Psychiatric: Mood and affect are normal. Speech and behavior are normal.  ____________________________________________   LABS (all labs ordered are listed, but only abnormal results are displayed)  Labs Reviewed  URINALYSIS COMPLETEWITH MICROSCOPIC ([place])   ____________________________________________  RADIOLOGY  Not indicated ____________________________________________   PROCEDURES  Procedure(s) performed: None   ____________________________________________   INITIAL IMPRESSION / ASSESSMENT AND PLAN / ED COURSE  Pertinent labs & imaging results that were available during my care of the patient were reviewed by me and considered in my medical decision making (see chart for details).  IM Toradol given in the emergency department with minimal relief of pain. She will receive a prescription for Zanaflex and a referral to follow-up with orthopedics for symptoms that are not improving over the week. She was advised to return the emergency department for symptoms that change or worsen if she is unable schedule an appointment. ____________________________________________   FINAL CLINICAL IMPRESSION(S) / ED DIAGNOSES  Final diagnoses:  None       Victorino Dike, FNP 11/23/15 1543  Delman Kitten, MD 11/23/15 [phone]

## 2015-11-23 NOTE — Discharge Instructions (Signed)
Back Pain, Adult °Back pain is very common in adults. The cause of back pain is rarely dangerous and the pain often gets better over time. The cause of your back pain may not be known. Some common causes of back pain include: °· Strain of the muscles or ligaments supporting the spine. °· Wear and tear (degeneration) of the spinal disks. °· Arthritis. °· Direct injury to the back. °For many people, back pain may return. Since back pain is rarely dangerous, most people can learn to manage this condition on their own. °HOME CARE INSTRUCTIONS °Watch your back pain for any changes. The following actions may help to lessen any discomfort you are feeling: °· Remain active. It is stressful on your back to sit or stand in one place for long periods of time. Do not sit, drive, or stand in one place for more than 30 minutes at a time. Take short walks on even surfaces as soon as you are able. Try to increase the length of time you walk each day. °· Exercise regularly as directed by your health care provider. Exercise helps your back heal faster. It also helps avoid future injury by keeping your muscles strong and flexible. °· Do not stay in bed. Resting more than 1-2 days can delay your recovery. °· Pay attention to your body when you bend and lift. The most comfortable positions are those that put less stress on your recovering back. Always use proper lifting techniques, including: °¨ Bending your knees. °¨ Keeping the load close to your body. °¨ Avoiding twisting. °· Find a comfortable position to sleep. Use a firm mattress and lie on your side with your knees slightly bent. If you lie on your back, put a pillow under your knees. °· Avoid feeling anxious or stressed. Stress increases muscle tension and can worsen back pain. It is important to recognize when you are anxious or stressed and learn ways to manage it, such as with exercise. °· Take medicines only as directed by your health care provider. Over-the-counter  medicines to reduce pain and inflammation are often the most helpful. Your health care provider may prescribe muscle relaxant drugs. These medicines help dull your pain so you can more quickly return to your normal activities and healthy exercise. °· Apply ice to the injured area: °¨ Put ice in a plastic bag. °¨ Place a towel between your skin and the bag. °¨ Leave the ice on for 20 minutes, 2-3 times a day for the first 2-3 days. After that, ice and heat may be alternated to reduce pain and spasms. °· Maintain a healthy weight. Excess weight puts extra stress on your back and makes it difficult to maintain good posture. °SEEK MEDICAL CARE IF: °· You have pain that is not relieved with rest or medicine. °· You have increasing pain going down into the legs or buttocks. °· You have pain that does not improve in one week. °· You have night pain. °· You lose weight. °· You have a fever or chills. °SEEK IMMEDIATE MEDICAL CARE IF:  °· You develop new bowel or bladder control problems. °· You have unusual weakness or numbness in your arms or legs. °· You develop nausea or vomiting. °· You develop abdominal pain. °· You feel faint. °  °This information is not intended to replace advice given to you by your health care provider. Make sure you discuss any questions you have with your health care provider. °  °Document Released: 12/06/2005 Document Revised: 12/27/2014 Document Reviewed: 04/09/2014 °Elsevier Interactive Patient Education ©2016 [employer]   [employer]  Back Pain, Adult Back pain is very common in adults.The cause of back pain is rarely dangerous and the pain often gets better over time.The cause of your back pain may not be known. Some common causes of back pain include:  Strain of the muscles or ligaments supporting the spine.  Wear and tear (degeneration) of the spinal disks.  Arthritis.  Direct injury to the back. For many people, back pain may return. Since back pain is rarely dangerous, most people can  learn to manage this condition on their own. HOME CARE INSTRUCTIONS Watch your back pain for any changes. The following actions may help to lessen any discomfort you are feeling:  Remain active. It is stressful on your back to sit or stand in one place for long periods of time. Do not sit, drive, or stand in one place for more than 30 minutes at a time. Take short walks on even surfaces as soon as you are able.Try to increase the length of time you walk each day.  Exercise regularly as directed by your health care provider. Exercise helps your back heal faster. It also helps avoid future injury by keeping your muscles strong and flexible.  Do not stay in bed.Resting more than 1-2 days can delay your recovery.  Pay attention to your body when you bend and lift. The most comfortable positions are those that put less stress on your recovering back. Always use proper lifting techniques, including:  Bending your knees.  Keeping the load close to your body.  Avoiding twisting.  Find a comfortable position to sleep. Use a firm mattress and lie on your side with your knees slightly bent. If you lie on your back, put a pillow under your knees.  Avoid feeling anxious or stressed.Stress increases muscle tension and can worsen back pain.It is important to recognize when you are anxious or stressed and learn ways to manage it, such as with exercise.  Take medicines only as directed by your health care provider. Over-the-counter medicines to reduce pain and inflammation are often the most helpful.Your health care provider may prescribe muscle relaxant drugs.These medicines help dull your pain so you can more quickly return to your normal activities and healthy exercise.  Apply ice to the injured area:  Put ice in a plastic bag.  Place a towel between your skin and the bag.  Leave the ice on for 20 minutes, 2-3 times a day for the first 2-3 days. After that, ice and heat may be alternated to  reduce pain and spasms.  Maintain a healthy weight. Excess weight puts extra stress on your back and makes it difficult to maintain good posture. SEEK MEDICAL CARE IF:  You have pain that is not relieved with rest or medicine.  You have increasing pain going down into the legs or buttocks.  You have pain that does not improve in one week.  You have night pain.  You lose weight.  You have a fever or chills. SEEK IMMEDIATE MEDICAL CARE IF:   You develop new bowel or bladder control problems.  You have unusual weakness or numbness in your arms or legs.  You develop nausea or vomiting.  You develop abdominal pain.  You feel faint.   This information is not intended to replace advice given to you by your health care provider. Make sure you discuss any questions you have with your health care provider.   Document Released: 12/06/2005 Document Revised: 12/27/2014 Document Reviewed: 04/09/2014 Elsevier Interactive Patient  Education ©2016 Elsevier Inc. ° °

## 2015-11-23 NOTE — ED Notes (Addendum)
Developed lower back pain this am.  Non radiating denies any injury . States this may be a UTI denies any dysuria

## 2015-12-08 DIAGNOSIS — F411 Generalized anxiety disorder: Secondary | ICD-10-CM | POA: Insufficient documentation

## 2015-12-31 ENCOUNTER — Other Ambulatory Visit: Payer: Self-pay | Admitting: Cardiovascular Disease

## 2016-02-02 ENCOUNTER — Telehealth: Payer: Self-pay | Admitting: Cardiovascular Disease

## 2016-02-02 NOTE — Telephone Encounter (Signed)
Pt c/o Shortness Of Breath: STAT if SOB developed within the last 24 hours or pt is noticeably SOB on the phone  1. Are you currently SOB (can you hear that pt is SOB on the phone)? No, but after exertion    2. How long have you been experiencing SOB? 4 days   3. Are you SOB when sitting or when up moving around? Moving around  4. Are you currently experiencing any other symptoms? Patient has gained about 5 lbs in the last week and sob for the last 4 she says she has already increased lasix per Dr. Donivan Scull suggestion for the past month despite this symptoms are getting worse   Please call to advise   Patient in in office currently with spouse (he has a [facility] appt)

## 2016-02-02 NOTE — Telephone Encounter (Signed)
Spoke w/ pt.  She reports that her wt is up, she is SOB and that her hands are puffy when she wakes up.  Pt reports that she has been drinking "lots of water to flush the fluid out". Advised pt to only drink when she is thirsty, try ice chips to keep her mouth moist but to avoid the volume.  Advised her to take an extra lasix after lunch today and call me tomorrow if her sx do not improve.  She is appreciative and will call back w/ any further questions or concerns.

## 2016-02-09 ENCOUNTER — Observation Stay
Admission: EM | Admit: 2016-02-09 | Discharge: 2016-02-11 | Disposition: A | Discharge status: Home / Self Care | Payer: Medicare HMO | Attending: Internal Medicine | Admitting: Internal Medicine

## 2016-02-09 ENCOUNTER — Encounter: Payer: Self-pay | Admitting: Emergency Medicine

## 2016-02-09 ENCOUNTER — Emergency Department: Payer: Medicare HMO

## 2016-02-09 DIAGNOSIS — Z8679 Personal history of other diseases of the circulatory system: Secondary | ICD-10-CM | POA: Diagnosis present

## 2016-02-09 DIAGNOSIS — Z8262 Family history of osteoporosis: Secondary | ICD-10-CM | POA: Diagnosis not present

## 2016-02-09 DIAGNOSIS — Z885 Allergy status to narcotic agent status: Secondary | ICD-10-CM | POA: Insufficient documentation

## 2016-02-09 DIAGNOSIS — Z780 Asymptomatic menopausal state: Secondary | ICD-10-CM | POA: Diagnosis not present

## 2016-02-09 DIAGNOSIS — I420 Dilated cardiomyopathy: Secondary | ICD-10-CM | POA: Insufficient documentation

## 2016-02-09 DIAGNOSIS — K589 Irritable bowel syndrome without diarrhea: Secondary | ICD-10-CM | POA: Insufficient documentation

## 2016-02-09 DIAGNOSIS — I517 Cardiomegaly: Secondary | ICD-10-CM | POA: Insufficient documentation

## 2016-02-09 DIAGNOSIS — F329 Major depressive disorder, single episode, unspecified: Secondary | ICD-10-CM | POA: Diagnosis not present

## 2016-02-09 DIAGNOSIS — Z6835 Body mass index (BMI) 35.0-35.9, adult: Secondary | ICD-10-CM | POA: Diagnosis not present

## 2016-02-09 DIAGNOSIS — K644 Residual hemorrhoidal skin tags: Secondary | ICD-10-CM | POA: Insufficient documentation

## 2016-02-09 DIAGNOSIS — Z952 Presence of prosthetic heart valve: Secondary | ICD-10-CM | POA: Insufficient documentation

## 2016-02-09 DIAGNOSIS — R7989 Other specified abnormal findings of blood chemistry: Secondary | ICD-10-CM

## 2016-02-09 DIAGNOSIS — R110 Nausea: Secondary | ICD-10-CM | POA: Diagnosis not present

## 2016-02-09 DIAGNOSIS — Z951 Presence of aortocoronary bypass graft: Secondary | ICD-10-CM | POA: Diagnosis not present

## 2016-02-09 DIAGNOSIS — M069 Rheumatoid arthritis, unspecified: Secondary | ICD-10-CM | POA: Diagnosis not present

## 2016-02-09 DIAGNOSIS — Z8249 Family history of ischemic heart disease and other diseases of the circulatory system: Secondary | ICD-10-CM | POA: Insufficient documentation

## 2016-02-09 DIAGNOSIS — J45909 Unspecified asthma, uncomplicated: Secondary | ICD-10-CM | POA: Diagnosis not present

## 2016-02-09 DIAGNOSIS — I371 Nonrheumatic pulmonary valve insufficiency: Secondary | ICD-10-CM | POA: Diagnosis not present

## 2016-02-09 DIAGNOSIS — G43909 Migraine, unspecified, not intractable, without status migrainosus: Secondary | ICD-10-CM | POA: Insufficient documentation

## 2016-02-09 DIAGNOSIS — I7101 Dissection of thoracic aorta: Secondary | ICD-10-CM | POA: Diagnosis not present

## 2016-02-09 DIAGNOSIS — I071 Rheumatic tricuspid insufficiency: Secondary | ICD-10-CM | POA: Diagnosis not present

## 2016-02-09 DIAGNOSIS — Z9049 Acquired absence of other specified parts of digestive tract: Secondary | ICD-10-CM | POA: Diagnosis not present

## 2016-02-09 DIAGNOSIS — R778 Other specified abnormalities of plasma proteins: Secondary | ICD-10-CM | POA: Diagnosis not present

## 2016-02-09 DIAGNOSIS — R011 Cardiac murmur, unspecified: Secondary | ICD-10-CM | POA: Diagnosis not present

## 2016-02-09 DIAGNOSIS — Z818 Family history of other mental and behavioral disorders: Secondary | ICD-10-CM | POA: Diagnosis not present

## 2016-02-09 DIAGNOSIS — Z87891 Personal history of nicotine dependence: Secondary | ICD-10-CM | POA: Diagnosis not present

## 2016-02-09 DIAGNOSIS — Z823 Family history of stroke: Secondary | ICD-10-CM | POA: Insufficient documentation

## 2016-02-09 DIAGNOSIS — Z8261 Family history of arthritis: Secondary | ICD-10-CM | POA: Insufficient documentation

## 2016-02-09 DIAGNOSIS — Z96619 Presence of unspecified artificial shoulder joint: Secondary | ICD-10-CM | POA: Insufficient documentation

## 2016-02-09 DIAGNOSIS — Z888 Allergy status to other drugs, medicaments and biological substances status: Secondary | ICD-10-CM | POA: Insufficient documentation

## 2016-02-09 DIAGNOSIS — G4730 Sleep apnea, unspecified: Secondary | ICD-10-CM | POA: Insufficient documentation

## 2016-02-09 DIAGNOSIS — E559 Vitamin D deficiency, unspecified: Secondary | ICD-10-CM | POA: Insufficient documentation

## 2016-02-09 DIAGNOSIS — Q8740 Marfan's syndrome, unspecified: Secondary | ICD-10-CM | POA: Diagnosis not present

## 2016-02-09 DIAGNOSIS — E785 Hyperlipidemia, unspecified: Secondary | ICD-10-CM | POA: Diagnosis present

## 2016-02-09 DIAGNOSIS — Z841 Family history of disorders of kidney and ureter: Secondary | ICD-10-CM | POA: Diagnosis not present

## 2016-02-09 DIAGNOSIS — I25110 Atherosclerotic heart disease of native coronary artery with unstable angina pectoris: Secondary | ICD-10-CM | POA: Diagnosis not present

## 2016-02-09 DIAGNOSIS — I7102 Dissection of abdominal aorta: Secondary | ICD-10-CM | POA: Diagnosis not present

## 2016-02-09 DIAGNOSIS — I10 Essential (primary) hypertension: Secondary | ICD-10-CM | POA: Diagnosis present

## 2016-02-09 DIAGNOSIS — J309 Allergic rhinitis, unspecified: Secondary | ICD-10-CM | POA: Diagnosis not present

## 2016-02-09 DIAGNOSIS — I440 Atrioventricular block, first degree: Secondary | ICD-10-CM | POA: Diagnosis not present

## 2016-02-09 DIAGNOSIS — I4581 Long QT syndrome: Secondary | ICD-10-CM | POA: Diagnosis not present

## 2016-02-09 DIAGNOSIS — R739 Hyperglycemia, unspecified: Secondary | ICD-10-CM | POA: Diagnosis not present

## 2016-02-09 DIAGNOSIS — Z7951 Long term (current) use of inhaled steroids: Secondary | ICD-10-CM | POA: Insufficient documentation

## 2016-02-09 DIAGNOSIS — R079 Chest pain, unspecified: Secondary | ICD-10-CM

## 2016-02-09 DIAGNOSIS — K219 Gastro-esophageal reflux disease without esophagitis: Secondary | ICD-10-CM | POA: Diagnosis not present

## 2016-02-09 DIAGNOSIS — Z91010 Allergy to peanuts: Secondary | ICD-10-CM | POA: Diagnosis not present

## 2016-02-09 DIAGNOSIS — J40 Bronchitis, not specified as acute or chronic: Secondary | ICD-10-CM | POA: Insufficient documentation

## 2016-02-09 DIAGNOSIS — I110 Hypertensive heart disease with heart failure: Secondary | ICD-10-CM | POA: Diagnosis not present

## 2016-02-09 DIAGNOSIS — R42 Dizziness and giddiness: Secondary | ICD-10-CM | POA: Insufficient documentation

## 2016-02-09 DIAGNOSIS — I209 Angina pectoris, unspecified: Secondary | ICD-10-CM | POA: Diagnosis present

## 2016-02-09 DIAGNOSIS — R7303 Prediabetes: Secondary | ICD-10-CM

## 2016-02-09 DIAGNOSIS — K449 Diaphragmatic hernia without obstruction or gangrene: Secondary | ICD-10-CM | POA: Insufficient documentation

## 2016-02-09 DIAGNOSIS — F418 Other specified anxiety disorders: Secondary | ICD-10-CM | POA: Diagnosis not present

## 2016-02-09 DIAGNOSIS — I5022 Chronic systolic (congestive) heart failure: Secondary | ICD-10-CM | POA: Insufficient documentation

## 2016-02-09 DIAGNOSIS — M1990 Unspecified osteoarthritis, unspecified site: Secondary | ICD-10-CM | POA: Diagnosis not present

## 2016-02-09 DIAGNOSIS — I350 Nonrheumatic aortic (valve) stenosis: Secondary | ICD-10-CM | POA: Diagnosis not present

## 2016-02-09 DIAGNOSIS — R0789 Other chest pain: Principal | ICD-10-CM | POA: Insufficient documentation

## 2016-02-09 DIAGNOSIS — D649 Anemia, unspecified: Secondary | ICD-10-CM | POA: Diagnosis not present

## 2016-02-09 DIAGNOSIS — G4733 Obstructive sleep apnea (adult) (pediatric): Secondary | ICD-10-CM | POA: Diagnosis not present

## 2016-02-09 DIAGNOSIS — M549 Dorsalgia, unspecified: Secondary | ICD-10-CM | POA: Diagnosis not present

## 2016-02-09 DIAGNOSIS — G8929 Other chronic pain: Secondary | ICD-10-CM | POA: Insufficient documentation

## 2016-02-09 DIAGNOSIS — Z7982 Long term (current) use of aspirin: Secondary | ICD-10-CM | POA: Insufficient documentation

## 2016-02-09 DIAGNOSIS — G4700 Insomnia, unspecified: Secondary | ICD-10-CM | POA: Diagnosis not present

## 2016-02-09 DIAGNOSIS — Z79899 Other long term (current) drug therapy: Secondary | ICD-10-CM | POA: Diagnosis not present

## 2016-02-09 LAB — CBC
HCT: 36.8 % (ref 35.0–47.0)
Hemoglobin: 12.6 g/dL (ref 12.0–16.0)
MCH: 31.9 pg (ref 26.0–34.0)
MCHC: 34.3 g/dL (ref 32.0–36.0)
MCV: 93.1 fL (ref 80.0–100.0)
Platelets: 222 10*3/uL (ref 150–440)
RBC: 3.96 MIL/uL (ref 3.80–5.20)
RDW: 13.6 % (ref 11.5–14.5)
WBC: 6.1 10*3/uL (ref 3.6–11.0)

## 2016-02-09 LAB — BASIC METABOLIC PANEL
Anion gap: 5 (ref 5–15)
BUN: 21 mg/dL — ABNORMAL HIGH (ref 6–20)
CO2: 30 mmol/L (ref 22–32)
Calcium: 9.1 mg/dL (ref 8.9–10.3)
Chloride: 105 mmol/L (ref 101–111)
Creatinine, Ser: 0.88 mg/dL (ref 0.44–1.00)
GFR calc Af Amer: >60 mL/min (ref >60)
GFR calc non Af Amer: >60 mL/min (ref >60)
Glucose, Bld: 103 mg/dL — ABNORMAL HIGH (ref 65–99)
Potassium: 3.8 mmol/L (ref 3.5–5.1)
Sodium: 140 mmol/L (ref 135–145)

## 2016-02-09 LAB — TROPONIN I
Troponin I: 0.04 ng/mL — ABNORMAL HIGH (ref <0.031)
Troponin I: <0.03 ng/mL (ref <0.031)

## 2016-02-09 MED ORDER — IOHEXOL 350 MG/ML SOLN
75.0000 mL | Freq: Once | INTRAVENOUS | Status: AC | PRN
  Administered 2016-02-09: 75 mL via INTRAVENOUS

## 2016-02-09 MED ORDER — ASPIRIN 81 MG PO CHEW
324.0000 mg | CHEWABLE_TABLET | Freq: Once | ORAL | Status: AC
  Administered 2016-02-09: 324 mg via ORAL
  Filled 2016-02-09: qty 4

## 2016-02-09 MED ORDER — SODIUM CHLORIDE 0.9 % IV SOLN
Freq: Once | INTRAVENOUS | Status: AC
  Administered 2016-02-09: 20:00:00 via INTRAVENOUS

## 2016-02-09 NOTE — ED Provider Notes (Signed)
[facility] Emergency Department Provider Note  ____________________________________________  Time seen: Approximately 6:57 PM  I have reviewed the triage vital signs and the nursing notes.   HISTORY  Chief Complaint Chest Pain    HPI Rachel Torres is a 60 y.o. female who reports chest pain off and on since about 2:30 this afternoon. It is heavy tight and dull and radiates through to the back and up to the neck. Patient not short of breath or nauseated with it. Breathing does not make it worse movement does not make it worse she has not been active since came on. She has a known thoracic aneurysm. She had it repaired at [facility] previously and had a an aortic file biomechanical type and a CABG at the same time. She had an MRA of her chest and abdomen 2 weeks ago which showed the aneurysm was stable. Patient does not usually have a levator troponin   Past Medical History  Diagnosis Date  . Allergy   . Asthma   . Anxiety   . Depression   . GERD (gastroesophageal reflux disease)   . Hyperlipidemia   . Hypertension   . Osteoporosis   . Gallbladder sludge   . AAA (abdominal aortic aneurysm) ([place])     with rupture and surgery  . Marfan syndrome   . RA (rheumatoid arthritis) ([facility])     per Dr. [name]  . Sleep apnea   . Migraine     Patient Active Problem List   Diagnosis Date Noted  . Angina pectoris ([place]) 02/09/2016  . Change in vision 02/19/2015  . Chest pain 07/09/2013  . Cramping of feet 04/24/2013  . Other screening mammogram 01/15/2013  . Vertigo 08/18/2012  . Vitamin D deficiency disease 07/03/2012  . Nausea 05/24/2012  . Aortic dissection ([place]) 05/12/2012  . Anemia 05/12/2012  . Obesity 11/21/2011  . Unspecified vitamin D deficiency 10/19/2011  . Bronchitis 10/04/2011  . Routine general medical examination at a health care facility 09/20/2011  . Gynecological examination 09/20/2011  . Atypical chest pain 09/15/2011  . Congestive dilated  cardiomyopathy ([place]) 02/04/2011  . UNSPECIFIED ANEMIA 01/18/2011  . S/P AVR (aortic valve replacement) 01/07/2011  . CORONARY ARTERY BYPASS GRAFT, HX OF 01/07/2011  . DIZZINESS 01/06/2011  . Chronic back pain 12/15/2010  . AAA 12/11/2010  . GALLBLADDER DISEASE 12/11/2010  . IRRITABLE BOWEL SYNDROME 09/09/2009  . PERSISTENT DISORDER INITIATING/MAINTAINING SLEEP 08/12/2009  . Obstructive sleep apnea 08/04/2009  . EXTERNAL HEMORRHOIDS, WITH COMPLICATION [id number]  . OSTEOPOROSIS 01/09/2009  . POSTMENOPAUSAL STATUS 12/02/2008  . INSOMNIA 04/02/2008  . Hyperlipidemia 07/26/2007  . Depression with anxiety 07/26/2007  . Essential hypertension 07/26/2007  . CORONARY ARTERY SPASM 07/26/2007  . Cardiac dysrhythmia 07/26/2007  . ALLERGIC RHINITIS 07/26/2007  . ASTHMA 07/26/2007  . GERD 07/26/2007  . OSTEOARTHRITIS 07/26/2007  . MURMUR 07/26/2007  . MIGRAINES, HX OF 07/26/2007    Past Surgical History  Procedure Laterality Date  . Cesarean section  [place]  . Cardiac catheterization  2001  . Fracture surgery      shoulder replacement  . Orif distal radius fracture    . Abdominal aortic aneurysm repair    . Cholecystectomy      No current outpatient prescriptions on file.  Allergies Alendronate sodium; Hydrocod polst-cpm polst er; Hydrocodone-acetaminophen; Oxycodone; Peanut-containing drug products; and Prednisone  Family History  Problem Relation Age of Onset  . Hypertension Mother   . Depression Mother   . Osteoporosis Mother   .  Stroke Mother   . Arthritis Sister   . Aneurysm Paternal Grandmother   . Nephrolithiasis Sister   . Osteoporosis Sister     Social History Social History  Substance Use Topics  . Smoking status: Former Research scientist (life sciences)  . Smokeless tobacco: Never Used  . Alcohol Use: Yes     Comment: occassional    Review of Systems Constitutional: No fever/chills Eyes: No visual changes. ENT: No sore throat. Cardiovascular: Denies chest  pain. Respiratory: Denies shortness of breath. Gastrointestinal: No abdominal pain.  No nausea, no vomiting.  No diarrhea.  No constipation. Genitourinary: Negative for dysuria. Musculoskeletal: Negative for back pain. Skin: Negative for rash. Neurological: Negative for headaches, focal weakness or numbness.  10-point ROS otherwise negative.  ____________________________________________   PHYSICAL EXAM:  VITAL SIGNS: ED Triage Vitals  Enc Vitals Group     BP 02/09/16 1606 103/45 mmHg     Pulse Rate 02/09/16 1606 68     Resp 02/09/16 1606 16     Temp 02/09/16 1606 97.8 F (36.6 C)     Temp Source 02/09/16 1606 Oral     SpO2 02/09/16 1606 97 %     Weight 02/09/16 1606 208 lb (94.348 kg)     Height 02/09/16 1606 5\' 5"  (1.651 m)     Head Cir --      Peak Flow --      Pain Score 02/09/16 1608 7     Pain Loc --      Pain Edu? --      Excl. in [place]? --     Constitutional: Alert and oriented. Well appearing and in no acute distress. Eyes: Conjunctivae are normal. PERRL. EOMI. Head: Atraumatic. Nose: No congestion/rhinnorhea. Mouth/Throat: Mucous membranes are moist.  Oropharynx non-erythematous. Neck: No stridor.  Cardiovascular: Normal rate, regular rhythm. Grossly normal heart sounds.  Good peripheral circulation. Respiratory: Normal respiratory effort.  No retractions. Lungs CTAB. Gastrointestinal: Soft and nontender. No distention. No abdominal bruits. No CVA tenderness. Musculoskeletal: No lower extremity tenderness nor edema.  No joint effusions. Neurologic:  Normal speech and language. No gross focal neurologic deficits are appreciated. No gait instability. Skin:  Skin is warm, dry and intact. No rash noted. Psychiatric: Mood and affect are normal. Speech and behavior are normal.  ____________________________________________   LABS (all labs ordered are listed, but only abnormal results are displayed)  Labs Reviewed  BASIC METABOLIC PANEL - Abnormal; Notable for  the following:    Glucose, Bld 103 (*)    BUN 21 (*)    All other components within normal limits  TROPONIN I - Abnormal; Notable for the following:    Troponin I 0.04 (*)    All other components within normal limits  CBC - Abnormal; Notable for the following:    RBC 3.77 (*)    Hemoglobin 11.9 (*)    All other components within normal limits  CBC  TROPONIN I  CBC  TROPONIN I  BASIC METABOLIC PANEL   ____________________________________________  EKG EKG read and interpreted by me shows normal sinus rhythm a bleed with a rate of 71 1st degree AV block patient has flipped T's in 1 through 3 which are deeper than they were on the previous EKG. There is some sagging slight sagging of the ST segments  ____________________________________________  RADIOLOGY  X-ray read as stable by radiology ____________________________________________   PROCEDURES    ____________________________________________   INITIAL IMPRESSION / ASSESSMENT AND PLAN / ED COURSE  Pertinent labs & imaging results that  were available during my care of the patient were reviewed by me and considered in my medical decision making (see chart for details).  Patient has a history of thoracic aneurysm with a biomechanical valve which does not need anticoagulation. Patient also had a CABG. Patient had an MRA of the chest at the tertiary care center which I reviewed the report of the roots of the aneurysm was stable. ____________________________________________   FINAL CLINICAL IMPRESSION(S) / ED DIAGNOSES  Final diagnoses:  Chest pain, unspecified chest pain type  Elevated troponin  History of aneurysm      Nena Polio, MD 02/10/16 [phone]

## 2016-02-09 NOTE — H&P (Signed)
[facility] [place] NAME: Rachel Torres    MR#:  [id number]  DATE OF BIRTH:  Dec 04, 1956  DATE OF ADMISSION:  02/09/2016  PRIMARY CARE PHYSICIAN: Lysle Dingwall, MD   REQUESTING/REFERRING PHYSICIAN: Cinda Quest, MD  CHIEF COMPLAINT:   Chief Complaint  Patient presents with  . Chest Pain    HISTORY OF PRESENT ILLNESS:  Rachel Torres  is a 60 y.o. female who presents with angina. Patient states that she was at home resting today when she developed onset of central chest discomfort. She has a significant cardiac history including prior aortic dissection as well as aortic valve replacement and coronary bypass as part of her surgical repair for her dissection. She states that her chest pain has been fairly constant, radiates to her back, and is still present, currently rated 5 out of 10. In the ED her EKG did not show any significant ischemic findings, her first troponin was barely positive at 0.04, and her second troponin was negative. CTA of her chest showed stable treated dissection, no other significant changes. Despite the seemingly negative initial workup, given her persistent discomfort and her significant cardiac history, hospitalists were called for admission.  PAST MEDICAL HISTORY:   Past Medical History  Diagnosis Date  . Allergy   . Asthma   . Anxiety   . Depression   . GERD (gastroesophageal reflux disease)   . Hyperlipidemia   . Hypertension   . Osteoporosis   . Gallbladder sludge   . AAA (abdominal aortic aneurysm) ([place])     with rupture and surgery  . Marfan syndrome   . RA (rheumatoid arthritis) (HCC)     per Dr. [name]  . Sleep apnea   . Migraine     PAST SURGICAL HISTORY:   Past Surgical History  Procedure Laterality Date  . Cesarean section  [place]  . Cardiac catheterization  2001  . Fracture surgery      shoulder replacement  . Orif distal radius fracture    . Abdominal aortic aneurysm repair     . Cholecystectomy      SOCIAL HISTORY:   Social History  Substance Use Topics  . Smoking status: Former Research scientist (life sciences)  . Smokeless tobacco: Never Used  . Alcohol Use: Yes     Comment: occassional    FAMILY HISTORY:   Family History  Problem Relation Age of Onset  . Hypertension Mother   . Depression Mother   . Osteoporosis Mother   . Stroke Mother   . Arthritis Sister   . Aneurysm Paternal Grandmother   . Nephrolithiasis Sister   . Osteoporosis Sister     DRUG ALLERGIES:   Allergies  Allergen Reactions  . Alendronate Sodium Other (See Comments)    Reaction:  GI upset   . Hydrocod Polst-Cpm Polst Er Itching  . Hydrocodone-Acetaminophen Itching  . Oxycodone Itching  . Peanut-Containing Drug Products Other (See Comments)    Reaction:  Migraines   . Prednisone Rash    MEDICATIONS AT HOME:   Prior to Admission medications   Medication Sig Start Date End Date Taking? Authorizing Provider  albuterol (PROVENTIL HFA;VENTOLIN HFA) 108 (90 Base) MCG/ACT inhaler Inhale 2 puffs into the lungs every 4 (four) hours as needed for wheezing or shortness of breath.   Yes Historical Provider, MD  ALPRAZolam Duanne Moron) 0.5 MG tablet Take 1 tablet (0.5 mg total) by mouth at bedtime as needed for anxiety or sleep. 02/19/15  Yes Roque Lias  A Tower, MD  aspirin EC 81 MG tablet Take 81 mg by mouth at bedtime.   Yes Historical Provider, MD  atorvastatin (LIPITOR) 20 MG tablet Take 20 mg by mouth at bedtime.   Yes Historical Provider, MD  benzonatate (TESSALON) 100 MG capsule Take 100 mg by mouth 3 (three) times daily as needed for cough.   Yes Historical Provider, MD  carvedilol (COREG) 12.5 MG tablet Take 12.5 mg by mouth 2 (two) times daily with a meal.   Yes Historical Provider, MD  cetirizine (ZYRTEC) 10 MG tablet Take 10 mg by mouth at bedtime.   Yes Historical Provider, MD  cholecalciferol (VITAMIN D) 1000 units tablet Take 2,000 Units by mouth daily.   Yes Historical Provider, MD  clonazePAM  (KLONOPIN) 0.5 MG tablet Take 0.5 mg by mouth 2 (two) times daily as needed for anxiety.   Yes Historical Provider, MD  fluticasone (FLONASE) 50 MCG/ACT nasal spray Place 1-2 sprays into both nostrils daily as needed for rhinitis.    Yes Historical Provider, MD  furosemide (LASIX) 40 MG tablet Take 40 mg by mouth daily.   Yes Historical Provider, MD  lisinopril (PRINIVIL,ZESTRIL) 2.5 MG tablet Take 2.5 mg by mouth daily.   Yes Historical Provider, MD  magnesium gluconate (MAGONATE) 500 MG tablet Take 500 mg by mouth at bedtime.   Yes Historical Provider, MD  naproxen sodium (ANAPROX) 220 MG tablet Take 440 mg by mouth 2 (two) times daily as needed (for headaches/pain).   Yes Historical Provider, MD  nitroGLYCERIN (NITROSTAT) 0.4 MG SL tablet Place 1 tablet (0.4 mg total) under the tongue every 5 (five) minutes as needed for chest pain. 10/24/15  Yes Minna Merritts, MD  omeprazole (PRILOSEC) 40 MG capsule Take 1 capsule (40 mg total) by mouth daily. 03/09/13  Yes [name], MD  POTASSIUM PO Take 1 tablet by mouth at bedtime.   Yes Historical Provider, MD  promethazine (PHENERGAN) 25 MG tablet Take 0.5 tablets (12.5 mg total) by mouth every 8 (eight) hours as needed for nausea. 02/19/15  Yes [name], MD  traMADol (ULTRAM) 50 MG tablet Take 50-100 mg by mouth every 6 (six) hours as needed for moderate pain.   Yes Historical Provider, MD  ketorolac (TORADOL) 10 MG tablet Take 1 tablet (10 mg total) by mouth every 8 (eight) hours as needed for moderate pain (with food). Patient not taking: Reported on 02/09/2016 11/05/15   Eula Listen, MD  metoCLOPramide (REGLAN) 5 MG tablet Take 1 tablet (5 mg total) by mouth every 8 (eight) hours as needed for nausea. Patient not taking: Reported on 02/09/2016 11/05/15 11/04/16  Eula Listen, MD  potassium chloride (K-DUR) 10 MEQ tablet Take 1 tablet (10 mEq total) by mouth 2 (two) times daily as needed. Patient not taking: Reported on 02/09/2016  10/24/15   Minna Merritts, MD  tiZANidine (ZANAFLEX) 4 MG tablet Take 1 tablet (4 mg total) by mouth 3 (three) times daily. Patient not taking: Reported on 02/09/2016 11/23/15 11/22/16  Victorino Dike, FNP    REVIEW OF SYSTEMS:  Review of Systems  Constitutional: Negative for fever, chills, weight loss and malaise/fatigue.  HENT: Negative for ear pain, hearing loss and tinnitus.   Eyes: Negative for blurred vision, double vision, pain and redness.  Respiratory: Positive for shortness of breath. Negative for cough and hemoptysis.   Cardiovascular: Positive for chest pain. Negative for palpitations, orthopnea and leg swelling.  Gastrointestinal: Negative for nausea, vomiting, abdominal pain, diarrhea  and constipation.  Genitourinary: Negative for dysuria, frequency and hematuria.  Musculoskeletal: Negative for back pain, joint pain and neck pain.  Skin:       No acne, rash, or lesions  Neurological: Negative for dizziness, tremors, focal weakness and weakness.  Endo/Heme/Allergies: Negative for polydipsia. Does not bruise/bleed easily.  Psychiatric/Behavioral: Negative for depression. The patient is not nervous/anxious and does not have insomnia.      VITAL SIGNS:   Filed Vitals:   02/09/16 1606 02/09/16 1830 02/09/16 1900 02/09/16 1930  BP: 103/45 107/50 132/62 125/65  Pulse: 68 65  64  Temp: 97.8 F (36.6 C)     TempSrc: Oral     Resp: 16 19 20 19   Height: 5\' 5"  (1.651 m)     Weight: 94.348 kg (208 lb)     SpO2: 97% 96%  98%   Wt Readings from Last 3 Encounters:  02/09/16 94.348 kg (208 lb)  11/23/15 94.348 kg (208 lb)  11/05/15 92.987 kg (205 lb)    PHYSICAL EXAMINATION:  Physical Exam  Vitals reviewed. Constitutional: She is oriented to person, place, and time. She appears well-developed and well-nourished. No distress.  HENT:  Head: Normocephalic and atraumatic.  Mouth/Throat: Oropharynx is clear and moist.  Eyes: Conjunctivae and EOM are normal. Pupils are equal,  round, and reactive to light. No scleral icterus.  Neck: Normal range of motion. Neck supple. No JVD present. No thyromegaly present.  Cardiovascular: Normal rate, regular rhythm and intact distal pulses.  Exam reveals no gallop and no friction rub.   No murmur heard. Respiratory: Effort normal and breath sounds normal. No respiratory distress. She has no wheezes. She has no rales.  GI: Soft. Bowel sounds are normal. She exhibits no distension. There is no tenderness.  Musculoskeletal: Normal range of motion. She exhibits no edema.  No arthritis, no gout  Lymphadenopathy:    She has no cervical adenopathy.  Neurological: She is alert and oriented to person, place, and time. No cranial nerve deficit.  No dysarthria, no aphasia  Skin: Skin is warm and dry. No rash noted. No erythema.  Psychiatric: She has a normal mood and affect. Her behavior is normal. Judgment and thought content normal.    LABORATORY PANEL:   CBC  Recent Labs Lab 02/09/16 1611  WBC 6.1  HGB 12.6  HCT 36.8  PLT 222   ------------------------------------------------------------------------------------------------------------------  Chemistries   Recent Labs Lab 02/09/16 1611  NA 140  K 3.8  CL 105  CO2 30  GLUCOSE 103*  BUN 21*  CREATININE 0.88  CALCIUM 9.1   ------------------------------------------------------------------------------------------------------------------  Cardiac Enzymes  Recent Labs Lab 02/09/16 1925  TROPONINI <0.03   ------------------------------------------------------------------------------------------------------------------  RADIOLOGY:  Dg Chest 2 View  02/09/2016  CLINICAL DATA:  Chest pain and heaviness. EXAM: CHEST  2 VIEW COMPARISON:  07/05/2015. FINDINGS: The lungs are clear wiithout focal pneumonia, edema, pneumothorax or pleural effusion. The cardio pericardial silhouette is enlarged. Patient is status post median sternotomy. Patient is status post right  shoulder replacement. IMPRESSION: Stable.  No acute cardiopulmonary findings. Electronically Signed   By: Misty Stanley M.D.   On: 02/09/2016 16:49   Ct Angio Chest Aorta W/cm &/or Wo/cm  02/09/2016  CLINICAL DATA:  60 year old with acute onset of chest pain radiating to the back and neck. Elevated troponin. Personal history of repair of thoracic aortic dissection. EXAM: CT ANGIOGRAPHY CHEST WITHOUT AND WITH CONTRAST TECHNIQUE: Multidetector CT imaging of the chest was performed using the standard protocol both before and  during bolus administration of intravenous contrast. Multiplanar CT image reconstructions and MIPs were obtained to evaluate the vascular anatomy. CONTRAST:  58mL OMNIPAQUE IOHEXOL 350 MG/ML IV. COMPARISON:  CTA chest 07/05/2015. FINDINGS: Technical quality:  Very good. Cardiovascular: Unenhanced images demonstrate no evidence of mural thrombus within the thoracic or upper abdominal aorta. Dystrophic calcifications at the proximal and distal anastomosis of the ascending thoracic aortic graft, unchanged. No evidence of acute dissection. Right coronary artery graft patent. Heart enlarged with right and left ventricular enlargement. No pericardial effusion. Moderate LAD coronary atherosclerosis. Great vessels widely patent. Descending thoracic aortic dissection with thrombosis of the false lumen. Upper abdominal aortic dissection with contrast opacification of the false lumen of the upper abdominal aorta beginning just below the origin of the celiac artery, related to retrograde flow through lumbar arteries, unchanged. No evidence of aneurysmal dilation of the false lumen. Celiac, SMA and both main renal arteries arise from the true lumen and are widely patent. Mediastinum/Lymph Nodes: No pathologic lymphadenopathy. Small hiatal hernia. Normal-appearing esophagus. Visualized thyroid gland unremarkable. Lungs/Pleura: Pulmonary parenchyma clear without localized airspace consolidation, interstitial  disease, or parenchymal nodules or masses. Central airways patent without significant bronchial wall thickening. No pleural effusions. No pleural plaques or masses. Upper abdomen: Surgically absent gallbladder. No acute abnormality. Accessory splenic tissue medial to the lower pole of the spleen. Musculoskeletal: Osseous demineralization. Mild compression fracture of the upper endplate of 624THL, unchanged. No acute abnormality. Review of the MIP images confirms the above findings. IMPRESSION: 1. No evidence of acute thoracic or abdominal aortic dissection. 2. Stable satisfactory appearance to the ascending thoracic aortic graft. 3.  No acute cardiopulmonary disease. 4. Opacification of the false lumen of the chronic abdominal aortic dissection due to retrograde flow through lumbar arteries. No evidence of aneurysmal dilation of the false lumen and no significant change since July, 2016. Electronically Signed   By: Evangeline Dakin M.D.   On: 02/09/2016 20:13    EKG:   Orders placed or performed during the hospital encounter of 02/09/16  . EKG 12-Lead  . EKG 12-Lead  . ED EKG within 10 minutes  . ED EKG within 10 minutes    IMPRESSION AND PLAN:  Principal Problem:   Angina pectoris ([place]) - workup so far negative for any acute coronary pathology. However, given her history and her persistent chest pain, we will admit her to telemetry, finish cycling her enzymes for minimum of 3 total sets, get an echocardiogram, and a cardiology consult. Active Problems:   Essential hypertension - stable, continue home meds   Aortic dissection ([facility]) - stable on CTA chest   Hyperlipidemia - continue home meds   Depression with anxiety - continue home meds   GERD - home dose PPI  All the records are reviewed and case discussed with ED provider. Management plans discussed with the patient and/or family.  DVT PROPHYLAXIS: SubQ lovenox  GI PROPHYLAXIS: PPI  ADMISSION STATUS: Observation  CODE STATUS: Full Code  Status History    This patient does not have a recorded code status. Please follow your organizational policy for patients in this situation.      TOTAL TIME TAKING CARE OF THIS PATIENT: 45 minutes.    Ladaisha Portillo FIELDING 02/09/2016, 10:48 PM  [facility]  Office  [phone]  CC: Primary care physician; Lysle Dingwall, MD

## 2016-02-09 NOTE — ED Notes (Signed)
Troponin 0.04, Dr.Malinda notified

## 2016-02-09 NOTE — ED Notes (Signed)
C/o chest pain.  C/O chest heaviness that goes through to back and upto neck.  Onset of symptoms about 1 hour PTA.  States has had similar pain in the past.

## 2016-02-10 ENCOUNTER — Observation Stay: Admit: 2016-02-10 | Payer: Medicare HMO

## 2016-02-10 ENCOUNTER — Observation Stay (HOSPITAL_BASED_OUTPATIENT_CLINIC_OR_DEPARTMENT_OTHER)
Admit: 2016-02-10 | Discharge: 2016-02-10 | Disposition: A | Discharge status: Home / Self Care | Payer: Medicare HMO | Attending: Internal Medicine | Admitting: Internal Medicine

## 2016-02-10 DIAGNOSIS — K219 Gastro-esophageal reflux disease without esophagitis: Secondary | ICD-10-CM | POA: Diagnosis not present

## 2016-02-10 DIAGNOSIS — Z951 Presence of aortocoronary bypass graft: Secondary | ICD-10-CM

## 2016-02-10 DIAGNOSIS — E785 Hyperlipidemia, unspecified: Secondary | ICD-10-CM

## 2016-02-10 DIAGNOSIS — I10 Essential (primary) hypertension: Secondary | ICD-10-CM

## 2016-02-10 DIAGNOSIS — R079 Chest pain, unspecified: Secondary | ICD-10-CM

## 2016-02-10 DIAGNOSIS — I7103 Dissection of thoracoabdominal aorta: Secondary | ICD-10-CM | POA: Diagnosis not present

## 2016-02-10 DIAGNOSIS — Z8679 Personal history of other diseases of the circulatory system: Secondary | ICD-10-CM

## 2016-02-10 LAB — CBC
HCT: 35 % (ref 35.0–47.0)
Hemoglobin: 11.9 g/dL — ABNORMAL LOW (ref 12.0–16.0)
MCH: 31.7 pg (ref 26.0–34.0)
MCHC: 34.1 g/dL (ref 32.0–36.0)
MCV: 92.8 fL (ref 80.0–100.0)
Platelets: 194 10*3/uL (ref 150–440)
RBC: 3.77 MIL/uL — ABNORMAL LOW (ref 3.80–5.20)
RDW: 13.4 % (ref 11.5–14.5)
WBC: 6.7 10*3/uL (ref 3.6–11.0)

## 2016-02-10 LAB — BASIC METABOLIC PANEL
Anion gap: 6 (ref 5–15)
BUN: 22 mg/dL — ABNORMAL HIGH (ref 6–20)
CO2: 28 mmol/L (ref 22–32)
Calcium: 8.8 mg/dL — ABNORMAL LOW (ref 8.9–10.3)
Chloride: 107 mmol/L (ref 101–111)
Creatinine, Ser: 1 mg/dL (ref 0.44–1.00)
GFR calc Af Amer: >60 mL/min (ref >60)
GFR calc non Af Amer: >60 mL/min (ref >60)
Glucose, Bld: 141 mg/dL — ABNORMAL HIGH (ref 65–99)
Potassium: 3.6 mmol/L (ref 3.5–5.1)
Sodium: 141 mmol/L (ref 135–145)

## 2016-02-10 LAB — TROPONIN I: Troponin I: <0.03 ng/mL (ref <0.031)

## 2016-02-10 MED ORDER — SODIUM CHLORIDE 0.9% FLUSH
3.0000 mL | Freq: Two times a day (BID) | INTRAVENOUS | Status: DC
  Administered 2016-02-10 – 2016-02-11 (×3): 3 mL via INTRAVENOUS

## 2016-02-10 MED ORDER — NITROGLYCERIN 2 % TD OINT: 0.5000 [in_us] | TOPICAL_OINTMENT | Freq: Three times a day (TID) | TRANSDERMAL | Status: DC

## 2016-02-10 MED ORDER — ENOXAPARIN SODIUM 40 MG/0.4ML ~~LOC~~ SOLN
40.0000 mg | SUBCUTANEOUS | Status: DC
  Administered 2016-02-10: 40 mg via SUBCUTANEOUS
  Filled 2016-02-10: qty 0.4

## 2016-02-10 MED ORDER — ONDANSETRON HCL 4 MG PO TABS: 4.0000 mg | ORAL_TABLET | Freq: Four times a day (QID) | ORAL | Status: DC | PRN

## 2016-02-10 MED ORDER — NITROGLYCERIN 0.4 MG SL SUBL
0.4000 mg | SUBLINGUAL_TABLET | SUBLINGUAL | Status: DC | PRN
  Administered 2016-02-10: 0.4 mg via SUBLINGUAL
  Filled 2016-02-10: qty 1

## 2016-02-10 MED ORDER — ASPIRIN EC 81 MG PO TBEC
81.0000 mg | DELAYED_RELEASE_TABLET | Freq: Every day | ORAL | Status: DC
  Administered 2016-02-10: 81 mg via ORAL
  Filled 2016-02-10: qty 1

## 2016-02-10 MED ORDER — PANTOPRAZOLE SODIUM 40 MG PO TBEC
40.0000 mg | DELAYED_RELEASE_TABLET | Freq: Every day | ORAL | Status: DC
  Administered 2016-02-10 – 2016-02-11 (×2): 40 mg via ORAL
  Filled 2016-02-10 (×2): qty 1

## 2016-02-10 MED ORDER — ONDANSETRON HCL 4 MG/2ML IJ SOLN: 4.0000 mg | Freq: Four times a day (QID) | INTRAMUSCULAR | Status: DC | PRN

## 2016-02-10 MED ORDER — ADULT MULTIVITAMIN W/MINERALS CH
1.0000 | ORAL_TABLET | Freq: Every day | ORAL | Status: DC
  Administered 2016-02-10 – 2016-02-11 (×2): 1 via ORAL
  Filled 2016-02-10 (×2): qty 1

## 2016-02-10 MED ORDER — DIPHENHYDRAMINE HCL 25 MG PO CAPS
25.0000 mg | ORAL_CAPSULE | Freq: Four times a day (QID) | ORAL | Status: DC | PRN
  Administered 2016-02-10 (×3): 25 mg via ORAL
  Filled 2016-02-10 (×3): qty 1

## 2016-02-10 MED ORDER — OXYCODONE HCL 5 MG PO TABS
5.0000 mg | ORAL_TABLET | Freq: Four times a day (QID) | ORAL | Status: DC | PRN
  Administered 2016-02-10 (×3): 5 mg via ORAL
  Filled 2016-02-10 (×3): qty 1

## 2016-02-10 MED ORDER — ACETAMINOPHEN 325 MG PO TABS
650.0000 mg | ORAL_TABLET | Freq: Four times a day (QID) | ORAL | Status: DC | PRN
  Administered 2016-02-10 – 2016-02-11 (×3): 650 mg via ORAL
  Filled 2016-02-10 (×3): qty 2

## 2016-02-10 MED ORDER — CARVEDILOL 6.25 MG PO TABS
6.2500 mg | ORAL_TABLET | Freq: Two times a day (BID) | ORAL | Status: DC
  Administered 2016-02-11: 6.25 mg via ORAL
  Filled 2016-02-10: qty 1

## 2016-02-10 MED ORDER — CARVEDILOL 12.5 MG PO TABS
12.5000 mg | ORAL_TABLET | Freq: Two times a day (BID) | ORAL | Status: DC
  Administered 2016-02-10 (×2): 12.5 mg via ORAL
  Filled 2016-02-10 (×2): qty 1

## 2016-02-10 MED ORDER — CLONAZEPAM 0.5 MG PO TABS
0.5000 mg | ORAL_TABLET | Freq: Two times a day (BID) | ORAL | Status: DC | PRN
  Administered 2016-02-10: 0.5 mg via ORAL
  Filled 2016-02-10: qty 1

## 2016-02-10 MED ORDER — ACETAMINOPHEN 650 MG RE SUPP: 650.0000 mg | Freq: Four times a day (QID) | RECTAL | Status: DC | PRN

## 2016-02-10 MED ORDER — ATORVASTATIN CALCIUM 20 MG PO TABS
20.0000 mg | ORAL_TABLET | Freq: Every day | ORAL | Status: DC
  Administered 2016-02-10 (×2): 20 mg via ORAL
  Filled 2016-02-10 (×2): qty 1

## 2016-02-10 MED ORDER — LISINOPRIL 5 MG PO TABS
2.5000 mg | ORAL_TABLET | Freq: Every day | ORAL | Status: DC
  Administered 2016-02-10 – 2016-02-11 (×2): 2.5 mg via ORAL
  Filled 2016-02-10 (×2): qty 1

## 2016-02-10 NOTE — Consult Note (Signed)
Cardiology Consult    Patient ID: Rachel Torres MRN: [id number], DOB/AGE: [birth year]   Admit date: 02/09/2016 Date of Consult: 02/10/2016  Primary Physician: Lysle Dingwall, MD Reason for Consult: Chest Pain Primary Cardiologist: Dr. [name] Requesting Provider: Dr. Jannifer Franklin   History of Present Illness    Rachel Torres is a 60 y.o. female with past medical history of CAD (s/p single vessel CABG with SVG-RCA in 2011), AVR (2011), Type A aortic dissection (s/p repair in A999333), chronic systolic CHF (EF A999333 in 04/2015, improved from 30-35% in 2012), Marfan Syndrome, HTN, HLD, OSA (on CPAP) and RA who presented to [facility] on 02/09/2016 for chest tightness radiating into her neck.  The patient reports she developed chest pressure yesterday along her sternum which started at rest and lasted for several hours. She did not take any SL NTG at that time due to BP being low. She reports the pain radiated into her neck. Denies any associated nausea, vomiting, diaphoresis, or dyspnea. She came to the ED for evaluation and reports having episodes of pain overnight and into this morning. The pain lasts for 1-2 hours at a time. She has been taking Percocet for the pain but has to take Benadryl for associated itching and says this combination causes her to fall asleep and she is unable to say if it completely helps the pain, for when she wakes up the pain is present.  She does report having increased dyspnea with exertion last week. She called our office and it was recommended she take an additional Lasix after lunch. This resolved her symptoms and she denies any repeat symptoms since. Denies any lower extremity edema, orthopnea, or PND. Reports good compliance with her CPAP.   While admitted, her cyclic troponin values this admission have been 0.04, < 0.03, and < 0.03. WBC 6.7. Hgb 11.9. Platelets 194. Creatinine 1.00. CXR showed no acute cardiopulmonary findings. CTA showed no evidence of acute  thoracic or abdominal aortic dissection. EKG shows 1st degree AV block, HR 71, TWI in V1, V2, and V3 (present on previous tracings, slightly more prominent when compared to 10/24/2015).  Her last echocardiogram was in 05/06/2015 and showed an EF of 40-45% with diffuse hypokinesis. This was improved from an EF of 30-35% in 2012. Her valve was functioning normally at that time. Her last Lexiscan Myoview was in 06/2013 and showed no evidence of ischemia.   Past Medical History   Past Medical History  Diagnosis Date  . Allergy   . Asthma   . Anxiety   . Depression   . GERD (gastroesophageal reflux disease)   . Hyperlipidemia   . Hypertension   . Osteoporosis   . Gallbladder sludge   . AAA (abdominal aortic aneurysm) ([place])     with rupture and surgery  . Marfan syndrome   . RA (rheumatoid arthritis) ([facility])     per Dr. [name]  . Sleep apnea   . Migraine     Past Surgical History  Procedure Laterality Date  . Cesarean section  [place]  . Cardiac catheterization  2001  . Fracture surgery      shoulder replacement  . Orif distal radius fracture    . Abdominal aortic aneurysm repair    . Cholecystectomy       Allergies  Allergies  Allergen Reactions  . Alendronate Sodium Other (See Comments)    Reaction:  GI upset   . Hydrocod Polst-Cpm Polst Er Itching  . Hydrocodone-Acetaminophen Itching  .  Oxycodone Itching  . Peanut-Containing Drug Products Other (See Comments)    Reaction:  Migraines   . Prednisone Rash    Inpatient Medications    . aspirin EC  81 mg Oral QHS  . atorvastatin  20 mg Oral QHS  . carvedilol  12.5 mg Oral BID WC  . enoxaparin (LOVENOX) injection  40 mg Subcutaneous Q24H  . lisinopril  2.5 mg Oral Daily  . pantoprazole  40 mg Oral Daily  . sodium chloride flush  3 mL Intravenous Q12H    Family History    Family History  Problem Relation Age of Onset  . Hypertension Mother   . Depression Mother   . Osteoporosis Mother   . Stroke Mother   .  Arthritis Sister   . Aneurysm Paternal Grandmother   . Nephrolithiasis Sister   . Osteoporosis Sister     Social History    Social History   Social History  . Marital Status: Married    Spouse Name: N/A  . Number of Children: 2  . Years of Education: N/A   Occupational History  . Retail    Social History Main Topics  . Smoking status: Former Research scientist (life sciences)  . Smokeless tobacco: Never Used  . Alcohol Use: Yes     Comment: occassional  . Drug Use: No  . Sexual Activity: Not on file   Other Topics Concern  . Not on file   Social History Narrative     Review of Systems    General:  No chills, fever, night sweats or weight changes.  Cardiovascular:  No edema, orthopnea, palpitations, paroxysmal nocturnal dyspnea. Positive for chest pain and dyspnea on exertion. Dermatological: No rash, lesions/masses Respiratory: No cough, Positive for dyspnea. Urologic: No hematuria, dysuria Abdominal:   No nausea, vomiting, diarrhea, bright red blood per rectum, melena, or hematemesis Neurologic:  No visual changes, wkns, changes in mental status. All other systems reviewed and are otherwise negative except as noted above.  Physical Exam    Blood pressure 120/65, pulse 62, temperature 97.6 F (36.4 C), temperature source Oral, resp. rate 20, height 5\' 5"  (1.651 m), weight 208 lb 8 oz (94.575 kg), SpO2 98 %.  General: Pleasant, Caucasian female appearing in NAD Psych: Normal affect. Neuro: Alert and oriented X 3. Moves all extremities spontaneously. HEENT: Normal  Neck: Supple without bruits or JVD. Lungs:  Resp regular and unlabored, CTA without wheezing or rales. Heart: RRR no s3, s4, 2/6 SEM at RUSB. Crisp valve sounds present. Abdomen: Soft, non-tender, non-distended, BS + x 4.  Extremities: No clubbing, cyanosis or edema. DP/PT/Radials 2+ and equal bilaterally.  Labs    Troponin (Point of Care Test) No results for input(s): TROPIPOC in the last 72 hours.  Recent Labs   02/09/16 1611 02/09/16 1925 02/10/16 0028  TROPONINI 0.04* <0.03 <0.03   Lab Results  Component Value Date   WBC 6.7 02/10/2016   HGB 11.9* 02/10/2016   HCT 35.0 02/10/2016   MCV 92.8 02/10/2016   PLT 194 02/10/2016    Recent Labs Lab 02/10/16 0028  NA 141  K 3.6  CL 107  CO2 28  BUN 22*  CREATININE 1.00  CALCIUM 8.8*  GLUCOSE 141*   Lab Results  Component Value Date   CHOL 144 10/27/2015   HDL 46 10/27/2015   LDLCALC 81 10/27/2015   TRIG 87 10/27/2015   No results found for: [facility]   Radiology Studies    Dg Chest 2 View: 02/09/2016  CLINICAL DATA:  Chest pain and heaviness. EXAM: CHEST  2 VIEW COMPARISON:  07/05/2015. FINDINGS: The lungs are clear wiithout focal pneumonia, edema, pneumothorax or pleural effusion. The cardio pericardial silhouette is enlarged. Patient is status post median sternotomy. Patient is status post right shoulder replacement. IMPRESSION: Stable.  No acute cardiopulmonary findings. Electronically Signed   By: Misty Stanley M.D.   On: 02/09/2016 16:49   Ct Angio Chest Aorta W/cm &/or Wo/cm: 02/09/2016  CLINICAL DATA:  60 year old with acute onset of chest pain radiating to the back and neck. Elevated troponin. Personal history of repair of thoracic aortic dissection. EXAM: CT ANGIOGRAPHY CHEST WITHOUT AND WITH CONTRAST TECHNIQUE: Multidetector CT imaging of the chest was performed using the standard protocol both before and during bolus administration of intravenous contrast. Multiplanar CT image reconstructions and MIPs were obtained to evaluate the vascular anatomy. CONTRAST:  45mL OMNIPAQUE IOHEXOL 350 MG/ML IV. COMPARISON:  CTA chest 07/05/2015. FINDINGS: Technical quality:  Very good. Cardiovascular: Unenhanced images demonstrate no evidence of mural thrombus within the thoracic or upper abdominal aorta. Dystrophic calcifications at the proximal and distal anastomosis of the ascending thoracic aortic graft, unchanged. No evidence of acute dissection.  Right coronary artery graft patent. Heart enlarged with right and left ventricular enlargement. No pericardial effusion. Moderate LAD coronary atherosclerosis. Great vessels widely patent. Descending thoracic aortic dissection with thrombosis of the false lumen. Upper abdominal aortic dissection with contrast opacification of the false lumen of the upper abdominal aorta beginning just below the origin of the celiac artery, related to retrograde flow through lumbar arteries, unchanged. No evidence of aneurysmal dilation of the false lumen. Celiac, SMA and both main renal arteries arise from the true lumen and are widely patent. Mediastinum/Lymph Nodes: No pathologic lymphadenopathy. Small hiatal hernia. Normal-appearing esophagus. Visualized thyroid gland unremarkable. Lungs/Pleura: Pulmonary parenchyma clear without localized airspace consolidation, interstitial disease, or parenchymal nodules or masses. Central airways patent without significant bronchial wall thickening. No pleural effusions. No pleural plaques or masses. Upper abdomen: Surgically absent gallbladder. No acute abnormality. Accessory splenic tissue medial to the lower pole of the spleen. Musculoskeletal: Osseous demineralization. Mild compression fracture of the upper endplate of 624THL, unchanged. No acute abnormality. Review of the MIP images confirms the above findings. IMPRESSION: 1. No evidence of acute thoracic or abdominal aortic dissection. 2. Stable satisfactory appearance to the ascending thoracic aortic graft. 3.  No acute cardiopulmonary disease. 4. Opacification of the false lumen of the chronic abdominal aortic dissection due to retrograde flow through lumbar arteries. No evidence of aneurysmal dilation of the false lumen and no significant change since July, 2016. Electronically Signed   By: Evangeline Dakin M.D.   On: 02/09/2016 20:13    EKG & Cardiac Imaging    EKG: 1st degree AV block, HR 71, TWI in V1, V2, and V3 (present on  previous tracings, slightly more prominent when compared to 10/24/2015)  Echocardiogram: 05/06/2015 Study Conclusions - Left ventricle: The cavity size was mildly dilated. Wall thickness was normal. Systolic function was mildly to moderately reduced. The estimated ejection fraction was in the range of 40% to 45%. Diffuse hypokinesis. - Aortic valve: Transvalvular velocity was within the normal range. There was no stenosis. Mean gradient (S): 8 mm Hg. - Aorta: Aortic root dimension: 37 mm (ED). - Aortic root: The aortic root was normal in size. - Mitral valve: There was mild regurgitation. - Pulmonary arteries: Systolic pressure was within the normal range.  Assessment & Plan     1. Unstable Angina/ History of  CAD - s/p single vessel CABG with SVG-RCA in 2011 at the time of her dissection repair. - presented with chest pressure which started yesterday, occurring along her sternum, and lasting for 1-2 hours at a time. No exertional pain component noted. Did have dyspnea with exertion last week but reports this was relieved with extra dose of PO Lasix.  - Unable to tell if Percocet is helping with her pain, for she falls asleep after taking the medication and the the pain is present when she awakens. - cyclic troponin values have been 0.04, < 0.03, and < 0.03. EKG shows 1st degree AV block, HR 71, TWI in V1, V2, and V3 which was present on previous tracings, slightly more prominent when compared to 10/24/2015. - SL NTG administered at the time of this encounter due to her having continued chest pain. Reported her symptoms significantly improved. Started on NTG paste. Will need PRN Tylenol for associated headache. - Will discuss the patient with Dr. [name], for her symptoms have a mixture of typical and atypical symptoms. Previous notes mention a "cardiac catheterization would be difficult given her aortic anatomy".  I would recommend a Lexiscan Myoview in the morning to further evaluate  her symptoms. - continue ASA, statin, BB, ACE-I, and NTG patch.  2. Type A aortic dissection - s/p repair in 08/2010 - CTA this admission showed no evidence of acute thoracic or abdominal aortic dissection.  3. Aortic Stenosis - s/p AVR in 2011 - echo pending to assess valve function  4. Chronic Systolic CHF - EF A999333 in 04/2015, improved from 30-35% in 2012. - repeat echocardiogram is pending to reassess LV function. She does not appear volume overloaded on physical exam. - continue BB and ACE-I. On PRN Lasix prior to admission.  5. HTN - BP has been 103/45 - 132/65 in the past 24 hours - continue Coreg 12.5mg  BID and Lisinopril 2.5mg  daily.  6. HLD - continue statin therapy  7. OSA - on CPAP   Signed, Erma Heritage, PA-C 02/10/2016, 10:40 AM Pager: [phone]

## 2016-02-10 NOTE — Plan of Care (Signed)
Problem: Phase I Progression Outcomes Goal: Anginal pain relieved Outcome: Progressing Patient given Nitro 0.4mg  SL. Patient stated pain relieved to 1/10

## 2016-02-10 NOTE — Care Management Obs Status (Signed)
[facility] NOTIFICATION   Patient Details  Name: Rachel Torres MRN: [id number] Date of Birth: [birth year]   Medicare Observation Status Notification Given:  Yes    Jolly Mango, RN 02/10/2016, 8:48 AM

## 2016-02-10 NOTE — Progress Notes (Signed)
Patient complained of some chest pain 4/10. Per [place], PA, give one 0.4mg  nitro SL. Will continue to monitor patient.

## 2016-02-10 NOTE — Progress Notes (Signed)
[facility] [place] NAME: Rachel Torres    MR#:  [id number]  DATE OF BIRTH:  1956/12/08  SUBJECTIVE:  CHIEF COMPLAINT:   Chief Complaint  Patient presents with  . Chest Pain   Patient is 60 year old Caucasian female with past medical history significant for history of gastric esophageal reflux disease, hyperlipidemia, hypertension, abdominal aortic aneurysm, severe sleep apnea , who presented to the hospital with complaints of chest pain in lower part of the sternum, started at rest. . Patient's chest x-ray was unremarkable, CT scan of the chest revealed no evidence of aortic dissection. First troponin was mildly elevated at 0.04. However, subsequent to mod. Troponins were normal. Patient complains of chest pains, alleviated by nitroglycerin. Patient was seen by cardiologist, who recommended to have stress test done tomorrow morning .     Review of Systems  Constitutional: Negative for fever, chills and weight loss.  HENT: Negative for congestion.   Eyes: Negative for blurred vision and double vision.  Respiratory: Negative for cough, sputum production, shortness of breath and wheezing.   Cardiovascular: Positive for chest pain. Negative for palpitations, orthopnea, leg swelling and PND.  Gastrointestinal: Negative for nausea, vomiting, abdominal pain, diarrhea, constipation and blood in stool.  Genitourinary: Negative for dysuria, urgency, frequency and hematuria.  Musculoskeletal: Negative for falls.  Neurological: Negative for dizziness, tremors, focal weakness and headaches.  Endo/Heme/Allergies: Does not bruise/bleed easily.  Psychiatric/Behavioral: Negative for depression. The patient does not have insomnia.     VITAL SIGNS: Blood pressure 106/57, pulse 63, temperature 97.5 F (36.4 C), temperature source Oral, resp. rate 18, height 5\' 5"  (1.651 m), weight 94.575 kg (208 lb 8 oz), SpO2 93 %.  PHYSICAL EXAMINATION:    GENERAL:  61 y.o.-year-old patient lying in the bed with no acute distress.  EYES: Pupils equal, round, reactive to light and accommodation. No scleral icterus. Extraocular muscles intact.  HEENT: Head atraumatic, normocephalic. Oropharynx and nasopharynx clear.  NECK:  Supple, no jugular venous distention. No thyroid enlargement, no tenderness.  LUNGS: Normal breath sounds bilaterally, no wheezing, rales,rhonchi or crepitation. No use of accessory muscles of respiration.  CARDIOVASCULAR: S1, S2 normal. No murmurs, rubs, or gallops.  ABDOMEN: Soft, nontender, nondistended. Bowel sounds present. No organomegaly or mass.  EXTREMITIES: No pedal edema, cyanosis, or clubbing.  NEUROLOGIC: Cranial nerves II through XII are intact. Muscle strength 5/5 in all extremities. Sensation intact. Gait not checked.  PSYCHIATRIC: The patient is alert and oriented x 3.  SKIN: No obvious rash, lesion, or ulcer.   ORDERS/RESULTS REVIEWED:   CBC  Recent Labs Lab 02/09/16 1611 02/10/16 0028  WBC 6.1 6.7  HGB 12.6 11.9*  HCT 36.8 35.0  PLT 222 194  MCV 93.1 92.8  MCH 31.9 31.7  MCHC 34.3 34.1  RDW 13.6 13.4   ------------------------------------------------------------------------------------------------------------------  Chemistries   Recent Labs Lab 02/09/16 1611 02/10/16 0028  NA 140 141  K 3.8 3.6  CL 105 107  CO2 30 28  GLUCOSE 103* 141*  BUN 21* 22*  CREATININE 0.88 1.00  CALCIUM 9.1 8.8*   ------------------------------------------------------------------------------------------------------------------ estimated creatinine clearance is 69.7 mL/min (by C-G formula based on Cr of 1). ------------------------------------------------------------------------------------------------------------------ No results for input(s): TSH, T4TOTAL, T3FREE, THYROIDAB in the last 72 hours.  Invalid input(s): FREET3  Cardiac Enzymes  Recent Labs Lab 02/09/16 1611 02/09/16 1925  02/10/16 0028  TROPONINI 0.04* <0.03 <0.03   ------------------------------------------------------------------------------------------------------------------ Invalid input(s): POCBNP ---------------------------------------------------------------------------------------------------------------  RADIOLOGY: Dg Chest 2 View  02/09/2016  CLINICAL DATA:  Chest pain and heaviness. EXAM: CHEST  2 VIEW COMPARISON:  07/05/2015. FINDINGS: The lungs are clear wiithout focal pneumonia, edema, pneumothorax or pleural effusion. The cardio pericardial silhouette is enlarged. Patient is status post median sternotomy. Patient is status post right shoulder replacement. IMPRESSION: Stable.  No acute cardiopulmonary findings. Electronically Signed   By: Misty Stanley M.D.   On: 02/09/2016 16:49   Ct Angio Chest Aorta W/cm &/or Wo/cm  02/09/2016  CLINICAL DATA:  60 year old with acute onset of chest pain radiating to the back and neck. Elevated troponin. Personal history of repair of thoracic aortic dissection. EXAM: CT ANGIOGRAPHY CHEST WITHOUT AND WITH CONTRAST TECHNIQUE: Multidetector CT imaging of the chest was performed using the standard protocol both before and during bolus administration of intravenous contrast. Multiplanar CT image reconstructions and MIPs were obtained to evaluate the vascular anatomy. CONTRAST:  19mL OMNIPAQUE IOHEXOL 350 MG/ML IV. COMPARISON:  CTA chest 07/05/2015. FINDINGS: Technical quality:  Very good. Cardiovascular: Unenhanced images demonstrate no evidence of mural thrombus within the thoracic or upper abdominal aorta. Dystrophic calcifications at the proximal and distal anastomosis of the ascending thoracic aortic graft, unchanged. No evidence of acute dissection. Right coronary artery graft patent. Heart enlarged with right and left ventricular enlargement. No pericardial effusion. Moderate LAD coronary atherosclerosis. Great vessels widely patent. Descending thoracic aortic dissection  with thrombosis of the false lumen. Upper abdominal aortic dissection with contrast opacification of the false lumen of the upper abdominal aorta beginning just below the origin of the celiac artery, related to retrograde flow through lumbar arteries, unchanged. No evidence of aneurysmal dilation of the false lumen. Celiac, SMA and both main renal arteries arise from the true lumen and are widely patent. Mediastinum/Lymph Nodes: No pathologic lymphadenopathy. Small hiatal hernia. Normal-appearing esophagus. Visualized thyroid gland unremarkable. Lungs/Pleura: Pulmonary parenchyma clear without localized airspace consolidation, interstitial disease, or parenchymal nodules or masses. Central airways patent without significant bronchial wall thickening. No pleural effusions. No pleural plaques or masses. Upper abdomen: Surgically absent gallbladder. No acute abnormality. Accessory splenic tissue medial to the lower pole of the spleen. Musculoskeletal: Osseous demineralization. Mild compression fracture of the upper endplate of 624THL, unchanged. No acute abnormality. Review of the MIP images confirms the above findings. IMPRESSION: 1. No evidence of acute thoracic or abdominal aortic dissection. 2. Stable satisfactory appearance to the ascending thoracic aortic graft. 3.  No acute cardiopulmonary disease. 4. Opacification of the false lumen of the chronic abdominal aortic dissection due to retrograde flow through lumbar arteries. No evidence of aneurysmal dilation of the false lumen and no significant change since July, 2016. Electronically Signed   By: Evangeline Dakin M.D.   On: 02/09/2016 20:13    EKG:  Orders placed or performed during the hospital encounter of 02/09/16  . EKG 12-Lead  . EKG 12-Lead  . ED EKG within 10 minutes  . ED EKG within 10 minutes    ASSESSMENT AND PLAN:  Principal Problem:   Angina pectoris ([place]) Active Problems:   Hyperlipidemia   Depression with anxiety   Essential  hypertension   GERD   Aortic dissection ([place])  #1. Chest pain with elevated troponin, concerning for unstable angina, patient will be undergoing stress test tomorrow morning, if unremarkable, likely discharge home, no aortic dissection and CTA or PE. #2. Anemia, follow with therapy. #3. Hyperglycemia, get hemoglobin A1c. #4. Essential hypertension, well-controlled with current medications    Management plans discussed with the patient, family and they are in agreement.  DRUG ALLERGIES:  Allergies  Allergen Reactions  . Alendronate Sodium Other (See Comments)    Reaction:  GI upset   . Hydrocod Polst-Cpm Polst Er Itching  . Hydrocodone-Acetaminophen Itching  . Oxycodone Itching  . Peanut-Containing Drug Products Other (See Comments)    Reaction:  Migraines   . Prednisone Rash    CODE STATUS:     Code Status Orders        Start     Ordered   02/10/16 0002  Full code   Continuous     02/10/16 0002    Code Status History    Date Active Date Inactive Code Status Order ID Comments User Context   This patient has a current code status but no historical code status.    Advance Directive Documentation        Most Recent Value   Type of Advance Directive  Living will   Pre-existing out of facility DNR order (yellow form or pink MOST form)     "MOST" Form in Place?        TOTAL TIME TAKING CARE OF THIS PATIENT:  87minutes.    Theodoro Grist M.D on 02/10/2016 at 6:14 PM  Between 7am to 6pm - Pager - [phone]  After 6pm go to [email or web address] - password [email or web address]  [facility]  Office  [phone]  CC: Primary care physician; Lysle Dingwall, MD

## 2016-02-10 NOTE — Progress Notes (Signed)
Pt wears CPAP at home, did not bring hers with her & no order in for her to have one while she is here. MD paged, Dr. Jannifer Franklin to put in orders for CPAP at night. Will continue to monitor. Conley Simmonds, RN

## 2016-02-10 NOTE — Progress Notes (Signed)
Patient requesting multivitamin, Per Dr. Clayton Bibles, okay to order multivitamin for patient.

## 2016-02-11 ENCOUNTER — Encounter: Payer: Self-pay | Admitting: Radiology

## 2016-02-11 ENCOUNTER — Encounter (HOSPITAL_BASED_OUTPATIENT_CLINIC_OR_DEPARTMENT_OTHER): Payer: Medicare HMO

## 2016-02-11 DIAGNOSIS — E785 Hyperlipidemia, unspecified: Secondary | ICD-10-CM | POA: Diagnosis not present

## 2016-02-11 DIAGNOSIS — I7103 Dissection of thoracoabdominal aorta: Secondary | ICD-10-CM | POA: Diagnosis not present

## 2016-02-11 DIAGNOSIS — I10 Essential (primary) hypertension: Secondary | ICD-10-CM | POA: Diagnosis not present

## 2016-02-11 DIAGNOSIS — R7989 Other specified abnormal findings of blood chemistry: Secondary | ICD-10-CM

## 2016-02-11 DIAGNOSIS — R079 Chest pain, unspecified: Secondary | ICD-10-CM

## 2016-02-11 DIAGNOSIS — R7303 Prediabetes: Secondary | ICD-10-CM

## 2016-02-11 DIAGNOSIS — R739 Hyperglycemia, unspecified: Secondary | ICD-10-CM

## 2016-02-11 DIAGNOSIS — I209 Angina pectoris, unspecified: Secondary | ICD-10-CM | POA: Diagnosis not present

## 2016-02-11 DIAGNOSIS — R0789 Other chest pain: Secondary | ICD-10-CM | POA: Diagnosis not present

## 2016-02-11 DIAGNOSIS — R778 Other specified abnormalities of plasma proteins: Secondary | ICD-10-CM

## 2016-02-11 LAB — NM MYOCAR MULTI W/SPECT W/WALL MOTION / EF
Estimated workload: 1 METS
Exercise duration (min): 0 min
Exercise duration (sec): 0 s
LV dias vol: 122 mL
LV sys vol: 57 mL
MPHR: 162 {beats}/min
Peak HR: 94 {beats}/min
Percent HR: 58 %
Percent of predicted max HR: 58 %
Rest HR: 63 {beats}/min
SDS: 1
SRS: 7
SSS: 3
Stage 1 Grade: 0 %
Stage 1 HR: 65 {beats}/min
Stage 1 Speed: 0 mph
Stage 2 Grade: 0 %
Stage 2 HR: 65 {beats}/min
Stage 2 Speed: 0 mph
Stage 3 Grade: 0 %
Stage 3 HR: 64 {beats}/min
Stage 3 Speed: 0 mph
Stage 4 Grade: 0 %
Stage 4 HR: 94 {beats}/min
Stage 4 Speed: 0 mph
Stage 5 DBP: 54 mmHg
Stage 5 Grade: 0 %
Stage 5 HR: 88 {beats}/min
Stage 5 SBP: 111 mmHg
Stage 5 Speed: 0 mph
Stage 6 DBP: 53 mmHg
Stage 6 Grade: 0 %
Stage 6 HR: 80 {beats}/min
Stage 6 SBP: 129 mmHg
Stage 6 Speed: 0 mph
TID: 0.88

## 2016-02-11 LAB — HEMOGLOBIN A1C: Hgb A1c MFr Bld: 5.4 % (ref 4.0–6.0)

## 2016-02-11 MED ORDER — TECHNETIUM TC 99M SESTAMIBI - CARDIOLITE
13.3630 | Freq: Once | INTRAVENOUS | Status: AC | PRN
  Administered 2016-02-11: 10:00:00 13.363 via INTRAVENOUS

## 2016-02-11 MED ORDER — REGADENOSON 0.4 MG/5ML IV SOLN
0.4000 mg | Freq: Once | INTRAVENOUS | Status: AC
  Administered 2016-02-11: 0.4 mg via INTRAVENOUS

## 2016-02-11 MED ORDER — CARVEDILOL 6.25 MG PO TABS: 6.2500 mg | ORAL_TABLET | Freq: Two times a day (BID) | ORAL | Status: DC

## 2016-02-11 MED ORDER — TECHNETIUM TC 99M SESTAMIBI GENERIC - CARDIOLITE
30.2300 | Freq: Once | INTRAVENOUS | Status: AC | PRN
  Administered 2016-02-11: 30.23 via INTRAVENOUS

## 2016-02-11 NOTE — Progress Notes (Signed)
Patient is being discharge home in a stable condition, summary given and pt is informed to call for f/u appointment , verbalized understanding, left with husband

## 2016-02-11 NOTE — Discharge Summary (Signed)
[facility] [place] NAME: Rachel Torres    MR#:  [id number]  DATE OF BIRTH:  1956-05-20  DATE OF ADMISSION:  02/09/2016 ADMITTING PHYSICIAN: Lance Coon, MD  DATE OF DISCHARGE: No discharge date for patient encounter.  PRIMARY CARE PHYSICIAN: Lysle Dingwall, MD     ADMISSION DIAGNOSIS:  Elevated troponin [R79.89] History of aneurysm [Z86.79] Chest pain, unspecified chest pain type [R07.9]  DISCHARGE DIAGNOSIS:  Principal Problem:   Angina pectoris (HCC) Active Problems:   S/P CABG x 1   Elevated troponin   Hyperlipidemia   Depression with anxiety   Essential hypertension   GERD   Aortic dissection (HCC)   History of aneurysm   Morbid obesity due to excess calories ([place])   Hyperglycemia   SECONDARY DIAGNOSIS:   Past Medical History  Diagnosis Date  . Allergy   . Asthma   . Anxiety   . Depression   . GERD (gastroesophageal reflux disease)   . Hyperlipidemia   . Hypertension   . Osteoporosis   . Gallbladder sludge   . AAA (abdominal aortic aneurysm) ([place])     with rupture and surgery  . Marfan syndrome   . RA (rheumatoid arthritis) (HCC)     per Dr. [name]  . Sleep apnea   . Migraine     .pro HOSPITAL COURSE:  Patient is 60 year old Caucasian female with past medical history significant for history of gastric esophageal reflux disease, hyperlipidemia, hypertension, abdominal aortic aneurysm, severe sleep apnea , who presented to the hospital with complaints of chest pain in lower part of the sternum, started at rest. . Patient's chest x-ray was unremarkable, CT scan of the chest revealed no evidence of aortic dissection. First troponin was mildly elevated at 0.04. However, subsequent to mod. Troponins were normal. Patient complains of chest pains, alleviated by nitroglycerin. Patient was seen by cardiologist, who recommended to have stress test done . This test was performed on February 22 and was low risk  study. It showed small defect in anteroseptal and apical area, which could be related to breast attenuation, no cardiac catheterization was recommended Discussion by problem #1. Chest pain with elevated troponin,  patient underwent stress test , which was low risk, cardiology follow-up was recommended , no aortic dissection and CTA or PE. #2. Anemia, follow as outpatient #3. Hyperglycemia, hemoglobin A1c was 5.4, no diabetes. #4. Essential hypertension, well-controlled with current medications , Coreg dose was decreased from 12.5-6.25 mg twice daily.   DISCHARGE CONDITIONS:   Stable  CONSULTS OBTAINED:  Treatment Team:  Minna Merritts, MD  DRUG ALLERGIES:   Allergies  Allergen Reactions  . Alendronate Sodium Other (See Comments)    Reaction:  GI upset   . Hydrocod Polst-Cpm Polst Er Itching  . Hydrocodone-Acetaminophen Itching  . Oxycodone Itching  . Peanut-Containing Drug Products Other (See Comments)    Reaction:  Migraines   . Prednisone Rash    DISCHARGE MEDICATIONS:   Current Discharge Medication List    CONTINUE these medications which have NOT CHANGED   Details  albuterol (PROVENTIL HFA;VENTOLIN HFA) 108 (90 Base) MCG/ACT inhaler Inhale 2 puffs into the lungs every 4 (four) hours as needed for wheezing or shortness of breath.    ALPRAZolam (XANAX) 0.5 MG tablet Take 1 tablet (0.5 mg total) by mouth at bedtime as needed for anxiety or sleep. Qty: 30 tablet, Refills: 1    aspirin EC 81 MG tablet Take 81 mg by mouth  at bedtime.    atorvastatin (LIPITOR) 20 MG tablet Take 20 mg by mouth at bedtime.    benzonatate (TESSALON) 100 MG capsule Take 100 mg by mouth 3 (three) times daily as needed for cough.    carvedilol (COREG) 12.5 MG tablet Take 12.5 mg by mouth 2 (two) times daily with a meal.    cetirizine (ZYRTEC) 10 MG tablet Take 10 mg by mouth at bedtime.    cholecalciferol (VITAMIN D) 1000 units tablet Take 2,000 Units by mouth daily.    clonazePAM  (KLONOPIN) 0.5 MG tablet Take 0.5 mg by mouth 2 (two) times daily as needed for anxiety.    fluticasone (FLONASE) 50 MCG/ACT nasal spray Place 1-2 sprays into both nostrils daily as needed for rhinitis.     furosemide (LASIX) 40 MG tablet Take 40 mg by mouth daily.    lisinopril (PRINIVIL,ZESTRIL) 2.5 MG tablet Take 2.5 mg by mouth daily.    magnesium gluconate (MAGONATE) 500 MG tablet Take 500 mg by mouth at bedtime.    naproxen sodium (ANAPROX) 220 MG tablet Take 440 mg by mouth 2 (two) times daily as needed (for headaches/pain).    nitroGLYCERIN (NITROSTAT) 0.4 MG SL tablet Place 1 tablet (0.4 mg total) under the tongue every 5 (five) minutes as needed for chest pain. Qty: 25 tablet, Refills: 3    POTASSIUM PO Take 1 tablet by mouth at bedtime.    promethazine (PHENERGAN) 25 MG tablet Take 0.5 tablets (12.5 mg total) by mouth every 8 (eight) hours as needed for nausea. Qty: 30 tablet, Refills: 1    traMADol (ULTRAM) 50 MG tablet Take 50-100 mg by mouth every 6 (six) hours as needed for moderate pain.    ketorolac (TORADOL) 10 MG tablet Take 1 tablet (10 mg total) by mouth every 8 (eight) hours as needed for moderate pain (with food). Qty: 15 tablet, Refills: 0    metoCLOPramide (REGLAN) 5 MG tablet Take 1 tablet (5 mg total) by mouth every 8 (eight) hours as needed for nausea. Qty: 15 tablet, Refills: 0    potassium chloride (K-DUR) 10 MEQ tablet Take 1 tablet (10 mEq total) by mouth 2 (two) times daily as needed. Qty: 180 tablet, Refills: 3    tiZANidine (ZANAFLEX) 4 MG tablet Take 1 tablet (4 mg total) by mouth 3 (three) times daily. Qty: 30 tablet, Refills: 0      STOP taking these medications     omeprazole (PRILOSEC) 40 MG capsule          DISCHARGE INSTRUCTIONS:    Patient is to follow-up with primary care physician, cardiologist  If you experience worsening of your admission symptoms, develop shortness of breath, life threatening emergency, suicidal or  homicidal thoughts you must seek medical attention immediately by calling 911 or calling your MD immediately  if symptoms less severe.  You Must read complete instructions/literature along with all the possible adverse reactions/side effects for all the Medicines you take and that have been prescribed to you. Take any new Medicines after you have completely understood and accept all the possible adverse reactions/side effects.   Please note  You were cared for by a hospitalist during your hospital stay. If you have any questions about your discharge medications or the care you received while you were in the hospital after you are discharged, you can call the unit and asked to speak with the hospitalist on call if the hospitalist that took care of you is not available. Once you are discharged,  your primary care physician will handle any further medical issues. Please note that NO REFILLS for any discharge medications will be authorized once you are discharged, as it is imperative that you return to your primary care physician (or establish a relationship with a primary care physician if you do not have one) for your aftercare needs so that they can reassess your need for medications and monitor your lab values.    Today   CHIEF COMPLAINT:   Chief Complaint  Patient presents with  . Chest Pain    HISTORY OF PRESENT ILLNESS:  Rachel Torres  is a 61 y.o. female with a known history of gastric esophageal reflux disease, hyperlipidemia, hypertension, abdominal aortic aneurysm, severe sleep apnea , who presented to the hospital with complaints of chest pain in lower part of the sternum, started at rest. . Patient's chest x-ray was unremarkable, CT scan of the chest revealed no evidence of aortic dissection. First troponin was mildly elevated at 0.04. However, subsequent to mod. Troponins were normal. Patient complains of chest pains, alleviated by nitroglycerin. Patient was seen by cardiologist, who  recommended to have stress test done . This test was performed on February 22 and was low risk study. It showed small defect in anteroseptal and apical area, which could be related to breast attenuation, no cardiac catheterization was recommended Discussion by problem #1. Chest pain with elevated troponin,  patient underwent stress test , which was low risk, cardiology follow-up was recommended , no aortic dissection and CTA or PE. #2. Anemia, follow as outpatient #3. Hyperglycemia, hemoglobin A1c was 5.4, no diabetes. #4. Essential hypertension, well-controlled with current medications , Coreg dose was decreased from 12.5-6.25 mg twice daily.     VITAL SIGNS:  Blood pressure 107/59, pulse 84, temperature 98.2 F (36.8 C), temperature source Oral, resp. rate 16, height 5\' 5"  (1.651 m), weight 95.709 kg (211 lb), SpO2 98 %.  I/O:   Intake/Output Summary (Last 24 hours) at 02/11/16 1736 Last data filed at 02/11/16 1321  Gross per 24 hour  Intake    483 ml  Output    800 ml  Net   -317 ml    PHYSICAL EXAMINATION:  GENERAL:  60 y.o.-year-old patient lying in the bed with no acute distress.  EYES: Pupils equal, round, reactive to light and accommodation. No scleral icterus. Extraocular muscles intact.  HEENT: Head atraumatic, normocephalic. Oropharynx and nasopharynx clear.  NECK:  Supple, no jugular venous distention. No thyroid enlargement, no tenderness.  LUNGS: Normal breath sounds bilaterally, no wheezing, rales,rhonchi or crepitation. No use of accessory muscles of respiration.  CARDIOVASCULAR: S1, S2 normal. No murmurs, rubs, or gallops.  ABDOMEN: Soft, non-tender, non-distended. Bowel sounds present. No organomegaly or mass.  EXTREMITIES: No pedal edema, cyanosis, or clubbing.  NEUROLOGIC: Cranial nerves II through XII are intact. Muscle strength 5/5 in all extremities. Sensation intact. Gait not checked.  PSYCHIATRIC: The patient is alert and oriented x 3.  SKIN: No obvious  rash, lesion, or ulcer.   DATA REVIEW:   CBC  Recent Labs Lab 02/10/16 0028  WBC 6.7  HGB 11.9*  HCT 35.0  PLT 194    Chemistries   Recent Labs Lab 02/10/16 0028  NA 141  K 3.6  CL 107  CO2 28  GLUCOSE 141*  BUN 22*  CREATININE 1.00  CALCIUM 8.8*    Cardiac Enzymes  Recent Labs Lab 02/10/16 0028  TROPONINI <0.03    Microbiology Results  No results found for this or  any previous visit.  RADIOLOGY:  Nm Myocar Multi W/spect W/wall Motion / Ef  02/11/2016   This is a low risk study.  Defect 1: There is a small defect of mild severity present in the mid anteroseptal and apex location. Cannot exclude small apical infarct with peri-infarct ischemia, however breast attenuation is more likely.  Septal wall motion related to post cardiac surgery with focal, fixed anteroseptal defect - most consistent with artifact.  The left ventricular ejection fraction is normal (55-60%). .  There was no ST segment deviation noted during stress.  No T wave inversion was noted during stress.  LOW RISK STUDY. Cannot exclude mild infarct with minimal peri-infarct ischemia.  Would only consider invasive evaluation if Symptoms warrant re-evaluation given the somewhat equivocal results. Leonie Man, M.D., M.S. [employer]  [address] [place], [place] [address] [phone] Fax [phone]   Ct Angio Chest Aorta W/cm &/or Wo/cm  02/09/2016  CLINICAL DATA:  60 year old with acute onset of chest pain radiating to the back and neck. Elevated troponin. Personal history of repair of thoracic aortic dissection. EXAM: CT ANGIOGRAPHY CHEST WITHOUT AND WITH CONTRAST TECHNIQUE: Multidetector CT imaging of the chest was performed using the standard protocol both before and during bolus administration of intravenous contrast. Multiplanar CT image reconstructions and MIPs were obtained to evaluate the vascular anatomy. CONTRAST:  41mL OMNIPAQUE IOHEXOL 350 MG/ML IV.  COMPARISON:  CTA chest 07/05/2015. FINDINGS: Technical quality:  Very good. Cardiovascular: Unenhanced images demonstrate no evidence of mural thrombus within the thoracic or upper abdominal aorta. Dystrophic calcifications at the proximal and distal anastomosis of the ascending thoracic aortic graft, unchanged. No evidence of acute dissection. Right coronary artery graft patent. Heart enlarged with right and left ventricular enlargement. No pericardial effusion. Moderate LAD coronary atherosclerosis. Great vessels widely patent. Descending thoracic aortic dissection with thrombosis of the false lumen. Upper abdominal aortic dissection with contrast opacification of the false lumen of the upper abdominal aorta beginning just below the origin of the celiac artery, related to retrograde flow through lumbar arteries, unchanged. No evidence of aneurysmal dilation of the false lumen. Celiac, SMA and both main renal arteries arise from the true lumen and are widely patent. Mediastinum/Lymph Nodes: No pathologic lymphadenopathy. Small hiatal hernia. Normal-appearing esophagus. Visualized thyroid gland unremarkable. Lungs/Pleura: Pulmonary parenchyma clear without localized airspace consolidation, interstitial disease, or parenchymal nodules or masses. Central airways patent without significant bronchial wall thickening. No pleural effusions. No pleural plaques or masses. Upper abdomen: Surgically absent gallbladder. No acute abnormality. Accessory splenic tissue medial to the lower pole of the spleen. Musculoskeletal: Osseous demineralization. Mild compression fracture of the upper endplate of 624THL, unchanged. No acute abnormality. Review of the MIP images confirms the above findings. IMPRESSION: 1. No evidence of acute thoracic or abdominal aortic dissection. 2. Stable satisfactory appearance to the ascending thoracic aortic graft. 3.  No acute cardiopulmonary disease. 4. Opacification of the false lumen of the chronic  abdominal aortic dissection due to retrograde flow through lumbar arteries. No evidence of aneurysmal dilation of the false lumen and no significant change since July, 2016. Electronically Signed   By: Evangeline Dakin M.D.   On: 02/09/2016 20:13    EKG:   Orders placed or performed during the hospital encounter of 02/09/16  . EKG 12-Lead  . EKG 12-Lead  . ED EKG within 10 minutes  . ED EKG within 10 minutes      Management plans discussed with the patient, family and they  are in agreement.  CODE STATUS:     Code Status Orders        Start     Ordered   02/10/16 0002  Full code   Continuous     02/10/16 0002    Code Status History    Date Active Date Inactive Code Status Order ID Comments User Context   This patient has a current code status but no historical code status.    Advance Directive Documentation        Most Recent Value   Type of Advance Directive  Living will   Pre-existing out of facility DNR order (yellow form or pink MOST form)     "MOST" Form in Place?        TOTAL TIME TAKING CARE OF THIS PATIENT: 40 minutes.    Theodoro Grist M.D on 02/11/2016 at 5:36 PM  Between 7am to 6pm - Pager - [phone]  After 6pm go to [email or web address] - password [email or web address] [facility]  [facility]  Office  [phone]  CC: Primary care physician; Lysle Dingwall, MD

## 2016-02-11 NOTE — Progress Notes (Addendum)
Hospital Problem List     Principal Problem:   Angina pectoris [facility]) Active Problems:   Hyperlipidemia   Depression with anxiety   Essential hypertension   GERD   Aortic dissection (HCC)   History of aneurysm   Morbid obesity due to excess calories ([place])   S/P CABG x 1    Patient Profile:   Primary Cardiologist: Dr. [name]  60 y.o female with past medical history of CAD (s/p single vessel CABG with SVG-RCA in 2011), AVR (2011), Type A aortic dissection (s/p repair in A999333), chronic systolic CHF (EF A999333 in 04/2015, improved from 30-35% in 2012), Marfan Syndrome, HTN, HLD, OSA (on CPAP) and RA who presented to [facility] on 02/09/2016 for chest tightness radiating into her neck  Subjective   Denies any chest pain overnight or this morning. Her pain resolved yesterday prior to being started on NTG patch and never required the patch or SL NTG overnight. Breathing at baseline. Seen in Nuclear Medicine for 1-day NST.  Inpatient Medications    . aspirin EC  81 mg Oral QHS  . atorvastatin  20 mg Oral QHS  . carvedilol  6.25 mg Oral BID WC  . enoxaparin (LOVENOX) injection  40 mg Subcutaneous Q24H  . lisinopril  2.5 mg Oral Daily  . multivitamin with minerals  1 tablet Oral Daily  . nitroGLYCERIN  0.5 inch Topical 3 times per day  . pantoprazole  40 mg Oral Daily  . sodium chloride flush  3 mL Intravenous Q12H    Vital Signs    Filed Vitals:   02/10/16 2018 02/11/16 0500 02/11/16 0625 02/11/16 0917  BP: 100/46  118/44 119/61  Pulse: 59  57 63  Temp: 98.1 F (36.7 C)  97.9 F (36.6 C) 98.2 F (36.8 C)  TempSrc: Oral  Oral Oral  Resp: 20  19 18   Height:      Weight:  211 lb (95.709 kg)    SpO2: 98%  97% 97%    Intake/Output Summary (Last 24 hours) at 02/11/16 0918 Last data filed at 02/10/16 2023  Gross per 24 hour  Intake    483 ml  Output   1775 ml  Net  -1292 ml   Filed Weights   02/09/16 1606 02/10/16 0004 02/11/16 0500  Weight: 208 lb (94.348 kg) 208 lb  8 oz (94.575 kg) 211 lb (95.709 kg)    Physical Exam    General: Well developed, well nourished, female in no acute distress. Head: Normocephalic, atraumatic.  Neck: Supple without bruits, JVD not elevated. Lungs:  Resp regular and unlabored, CTA without wheezing or rales. Heart: RRR, S1, S2, no S3, S4, 2/6 SEM at RUSB; no rub. Abdomen: Soft, non-tender, non-distended with normoactive bowel sounds. No hepatomegaly. No rebound/guarding. No obvious abdominal masses. Extremities: No clubbing, cyanosis, or edema. Distal pedal pulses are 2+ bilaterally. Neuro: Alert and oriented X 3. Moves all extremities spontaneously. Psych: Normal affect.  Labs    CBC  Recent Labs  02/09/16 1611 02/10/16 0028  WBC 6.1 6.7  HGB 12.6 11.9*  HCT 36.8 35.0  MCV 93.1 92.8  PLT 222 [id number]   Basic Metabolic Panel  Recent Labs  02/09/16 1611 02/10/16 0028  NA 140 141  K 3.8 3.6  CL 105 107  CO2 30 28  GLUCOSE 103* 141*  BUN 21* 22*  CREATININE 0.88 1.00  CALCIUM 9.1 8.8*   Cardiac Enzymes  Recent Labs  02/09/16 1611 02/09/16 1925 02/10/16 0028  TROPONINI 0.04* <  0.03 <0.03    Telemetry    NSR, HR in 60's - 70's. No atopic events.  ECG    No new tracings.   Cardiac Studies and Radiology    Dg Chest 2 View: 02/09/2016  CLINICAL DATA:  Chest pain and heaviness. EXAM: CHEST  2 VIEW COMPARISON:  07/05/2015. FINDINGS: The lungs are clear wiithout focal pneumonia, edema, pneumothorax or pleural effusion. The cardio pericardial silhouette is enlarged. Patient is status post median sternotomy. Patient is status post right shoulder replacement. IMPRESSION: Stable.  No acute cardiopulmonary findings. Electronically Signed   By: Misty Stanley M.D.   On: 02/09/2016 16:49   Ct Angio Chest Aorta W/cm &/or Wo/cm: 02/09/2016  CLINICAL DATA:  60 year old with acute onset of chest pain radiating to the back and neck. Elevated troponin. Personal history of repair of thoracic aortic dissection. EXAM: CT  ANGIOGRAPHY CHEST WITHOUT AND WITH CONTRAST TECHNIQUE: Multidetector CT imaging of the chest was performed using the standard protocol both before and during bolus administration of intravenous contrast. Multiplanar CT image reconstructions and MIPs were obtained to evaluate the vascular anatomy. CONTRAST:  40mL OMNIPAQUE IOHEXOL 350 MG/ML IV. COMPARISON:  CTA chest 07/05/2015. FINDINGS: Technical quality:  Very good. Cardiovascular: Unenhanced images demonstrate no evidence of mural thrombus within the thoracic or upper abdominal aorta. Dystrophic calcifications at the proximal and distal anastomosis of the ascending thoracic aortic graft, unchanged. No evidence of acute dissection. Right coronary artery graft patent. Heart enlarged with right and left ventricular enlargement. No pericardial effusion. Moderate LAD coronary atherosclerosis. Great vessels widely patent. Descending thoracic aortic dissection with thrombosis of the false lumen. Upper abdominal aortic dissection with contrast opacification of the false lumen of the upper abdominal aorta beginning just below the origin of the celiac artery, related to retrograde flow through lumbar arteries, unchanged. No evidence of aneurysmal dilation of the false lumen. Celiac, SMA and both main renal arteries arise from the true lumen and are widely patent. Mediastinum/Lymph Nodes: No pathologic lymphadenopathy. Small hiatal hernia. Normal-appearing esophagus. Visualized thyroid gland unremarkable. Lungs/Pleura: Pulmonary parenchyma clear without localized airspace consolidation, interstitial disease, or parenchymal nodules or masses. Central airways patent without significant bronchial wall thickening. No pleural effusions. No pleural plaques or masses. Upper abdomen: Surgically absent gallbladder. No acute abnormality. Accessory splenic tissue medial to the lower pole of the spleen. Musculoskeletal: Osseous demineralization. Mild compression fracture of the upper  endplate of 624THL, unchanged. No acute abnormality. Review of the MIP images confirms the above findings. IMPRESSION: 1. No evidence of acute thoracic or abdominal aortic dissection. 2. Stable satisfactory appearance to the ascending thoracic aortic graft. 3.  No acute cardiopulmonary disease. 4. Opacification of the false lumen of the chronic abdominal aortic dissection due to retrograde flow through lumbar arteries. No evidence of aneurysmal dilation of the false lumen and no significant change since July, 2016. Electronically Signed   By: Evangeline Dakin M.D.   On: 02/09/2016 20:13    Echocardiogram: 02/10/2016 Study Conclusions - Left ventricle: The cavity size was normal. Systolic function was normal. The estimated ejection fraction was in the range of 55% to 65%. Septal motion consistent with post-operative state, otherwise normal wall motion. Left ventricular diastolic function parameters were normal. - Aortic valve: Normal thickness leaflets. A bioprosthesis was present. Transvalvular velocity was within the normal range. There was no stenosis. - Left atrium: The atrium was normal in size. - Right ventricle: Systolic function was normal. - Pulmonary arteries: Systolic pressure was within the normal range.  Impressions: -  Grossly, ascending aorta and aortic arch are intact with normal size.  Assessment & Plan    1. Unstable Angina/ History of CAD - s/p single vessel CABG with SVG-RCA in 2011 at the time of her dissection repair. - presented with chest pressure which started yesterday, occurring along her sternum, and lasting for 1-2 hours at a time. No exertional pain component noted. Did have dyspnea with exertion last week but reports this was relieved with extra dose of PO Lasix.  - Unable to tell if Percocet is helping with her pain, for she falls asleep after taking the medication and the the pain is present when she awakens. - cyclic troponin values have been  0.04, < 0.03, and < 0.03. EKG shows 1st degree AV block, HR 71, TWI in V1, V2, and V3 which was present on previous tracings, slightly more prominent when compared to 10/24/2015. - Previous notes mention a "cardiac catheterization would be difficult given her aortic anatomy". She is seen in Nuclear Medicine for a 1-day Lexiscan Myoview. Results are pending. If no significant ischemia noted, she would likely be stable for discharge from a Cardiology perspective later today. - continue ASA, statin, BB, and ACE-I.  2. Type A aortic dissection - s/p repair in 08/2010 - CTA this admission showed no evidence of acute thoracic or abdominal aortic dissection.  3. Aortic Stenosis - s/p AVR in 2011 - echo this admission shows the valve is functioning properly.  4. Chronic Systolic CHF - EF A999333 in 04/2015, improved from 30-35% in 2012. Repeat echo this admission shows an EF of 55-65%. - continue BB and ACE-I. On PRN Lasix prior to admission.  5. HTN - BP has been 97/44 - 119/61 in the past 24 hours - continue Coreg 6.25mg  BID and Lisinopril 2.5mg  daily. Coreg recently decreased from 12.5mg  BID to 6.25mg  BID on 02/10/2016 due to hypotension.  6. HLD - continue statin therapy  7. OSA - on CPAP  Signed, Erma Heritage , PA-C 9:18 AM 02/11/2016 Pager: [phone]  I have seen, examined and evaluated the patient this morning along with Ms. Ahmed Prima, PA .  After reviewing all the available data and chart,  I agree with her findings, examination as well as impression recommendations.  Pleasant 60 year old woman with a history of Marfan syndrome with aortic dissection with Bental Procedure with single-vessel CABG (aortic root replacement, AVR and SVG-RCA) in 2011. She has chronic combined heart failure with EF 40-5% that was improved from initial 30-35%. She presented with prolonged chest tightness and pressure radiating to her neck. It resolved after nitroglycerin. She never did have  nitroglycerin paste placed.  He was seen in initial consultation and Myoview stress test was recommended.  I am seeing her in the nuclear medicine lab.  She is indicated that if she were to have an abnormal nuclear stress test, she would like to go to [facility] for cardiac catheterization since then no anatomy. In that case, we would probably recommend discharge and have her referred to interventional cardiologist at [facility] for evaluation prior to catheterization.  No active heart failure symptoms on beta blocker, ACE inhibitor with when necessary Lasix. Likely her echocardiogram this admission shows improved EF overall.  Aortic valve appears to be functioning properly. No evidence of dissection on CTA.  For now would continue current medications, will consider Imdur as she has been intolerant of sublingual nitroglycerin due to hypotension. Beta blocker dose has been reduced due to concern for borderline blood pressures. On statin. Continued  CPAP.   We will await the results of her nuclear stress test. Recommendations to follow, but would likely recommend discharge in the absence of ongoing pain for plans to have her evaluated for chronic catheterization at [facility]. Obviously if the stress test is non-ischemic, will be discharged, current medications.   Leonie Man, M.D., M.S. Interventional Cardiologist   Pager # [phone] Phone # [phone] [address] [place], [place] [address]

## 2016-02-18 ENCOUNTER — Ambulatory Visit (INDEPENDENT_AMBULATORY_CARE_PROVIDER_SITE_OTHER): Payer: Medicare HMO | Admitting: Nurse Practitioner

## 2016-02-18 ENCOUNTER — Encounter: Payer: Self-pay | Admitting: Nurse Practitioner

## 2016-02-18 VITALS — BP 118/60 | HR 70 | Ht 65.0 in | Wt 208.0 lb

## 2016-02-18 DIAGNOSIS — R072 Precordial pain: Secondary | ICD-10-CM | POA: Diagnosis not present

## 2016-02-18 DIAGNOSIS — I119 Hypertensive heart disease without heart failure: Secondary | ICD-10-CM | POA: Diagnosis not present

## 2016-02-18 DIAGNOSIS — I2510 Atherosclerotic heart disease of native coronary artery without angina pectoris: Secondary | ICD-10-CM | POA: Insufficient documentation

## 2016-02-18 DIAGNOSIS — I714 Abdominal aortic aneurysm, without rupture, unspecified: Secondary | ICD-10-CM | POA: Insufficient documentation

## 2016-02-18 DIAGNOSIS — R0789 Other chest pain: Secondary | ICD-10-CM

## 2016-02-18 DIAGNOSIS — I5042 Chronic combined systolic (congestive) and diastolic (congestive) heart failure: Secondary | ICD-10-CM | POA: Diagnosis not present

## 2016-02-18 DIAGNOSIS — E785 Hyperlipidemia, unspecified: Secondary | ICD-10-CM

## 2016-02-18 MED ORDER — ISOSORBIDE MONONITRATE ER 30 MG PO TB24: 15.0000 mg | ORAL_TABLET | Freq: Every day | ORAL | Status: DC

## 2016-02-18 NOTE — Patient Instructions (Addendum)
Medication Instructions:  Your physician has recommended you make the following change in your medication:  START taking Imdur 15mg  daily   Labwork: none  Testing/Procedures: none  Follow-Up: Your physician recommends that you follow up with Dr. [name] in May   Any Other Special Instructions Will Be Listed Below (If Applicable).     If you need a refill on your cardiac medications before your next appointment, please call your pharmacy.

## 2016-02-18 NOTE — Progress Notes (Signed)
Office Visit    Patient Name: Rachel Torres Date of Encounter: 02/18/2016  Primary Care Provider:  Lysle Dingwall, MD Primary Cardiologist:  Johnny Bridge, MD   Chief Complaint    60 y/o ? with a h/o Asc Ao dissection s/p repair, AVR, and CABG x 1, who presents for f/u after recently hospitalization for c/p.  Past Medical History    Past Medical History  Diagnosis Date  . Allergy   . Asthma   . Anxiety   . Depression   . GERD (gastroesophageal reflux disease)   . Hyperlipidemia   . Hypertensive heart disease   . Osteoporosis   . Gallbladder sludge   . Hx of repair of dissecting thoracic aortic aneurysm, [place] type A     a. 08/2010 s/p repair with AVR and VG->RCA; b. 01/2016 CTA: stable appearance of Asc Thoracic Aortic graft. Opacification of flase lumen of chronic abd Ao dissection w/ retrograde flow through lumbar arteries. No aneurysmal dil of flase lumen.  . Marfan syndrome   . RA (rheumatoid arthritis) ([place])     a. Followed by Dr. [name]  . Obstructive sleep apnea   . Migraine   . History of Bicuspid Aortic Valve     a. 08/2010 s/p AVR @ time of Ao dissection repair; b. 01/2016 Echo: Ef 55-65%, nl AoV bioprosthesis, nl RV, nl PASP.  Marland Kitchen CAD (coronary artery disease)     a. 08/2010 s/p CABG x 1 (VG->RCA) @ time of Ao dissection repair; b. 01/2016 Lexiscan MV: mid antsept/apical defect w/ ? peri-infarct ischemia-->likely attenuation-->Med Rx.  Marland Kitchen AAA (abdominal aortic aneurysm) ([place])     a. Chronic w/o evidence of aneurysmal dil on CTA 01/2016.  Marland Kitchen Chronic combined systolic (congestive) and diastolic (congestive) heart failure ([place])     a. 2012 EF 30-35%; b. 04/2015 EF 40-45%; c. 01/2016 Echo: Ef 55-65%, nl AoV bioprosthesis, nl RV, nl PASP.   Past Surgical History  Procedure Laterality Date  . Cesarean section  [place]  . Cardiac catheterization  2001  . Fracture surgery      shoulder replacement  . Orif distal radius fracture    . Abdominal aortic aneurysm repair     . Cholecystectomy      Allergies  Allergies  Allergen Reactions  . Alendronate Sodium Other (See Comments)    Reaction:  GI upset   . Hydrocod Polst-Cpm Polst Er Itching  . Hydrocodone-Acetaminophen Itching  . Oxycodone Itching  . Peanut-Containing Drug Products Other (See Comments)    Reaction:  Migraines   . Prednisone Rash    History of Present Illness    60 y/o ? with the above complex PMH.  She has a h/o thoracic ascending Ao dissection in 08/2010 with repair, AVR, and CABG x 1.  She was recently admitted to [facility] 2/2 c/p, which occurred after eating lunch.  C/P resolved with sl NTG in the ED.  She r/o for MI and underwent stress testing, which was read as low risk with probable attenuation artifact.  She was d/c'd home.  Since discharge, she has continued to have intermittent midsternal chest discomfort w/o associated Ss.  This does often occur following a large meal and can last hours @ a time.  She is on reglan chronically.  Pain does not seem to change with activity.  She denies palpitations, dyspnea, pnd, orthopnea, n, v, dizziness, syncope, edema, weight gain, or early satiety.   Home Medications    Prior to Admission  medications   Medication Sig Start Date End Date Taking? Authorizing Provider  albuterol (PROVENTIL HFA;VENTOLIN HFA) 108 (90 Base) MCG/ACT inhaler Inhale 2 puffs into the lungs every 4 (four) hours as needed for wheezing or shortness of breath.   Yes Historical Provider, MD  ALPRAZolam Duanne Moron) 0.5 MG tablet Take 1 tablet (0.5 mg total) by mouth at bedtime as needed for anxiety or sleep. 02/19/15  Yes [name], MD  aspirin EC 81 MG tablet Take 81 mg by mouth at bedtime.   Yes Historical Provider, MD  atorvastatin (LIPITOR) 20 MG tablet Take 20 mg by mouth at bedtime.   Yes Historical Provider, MD  benzonatate (TESSALON) 100 MG capsule Take 100 mg by mouth 3 (three) times daily as needed for cough.   Yes Historical Provider, MD  carvedilol (COREG) 6.25 MG  tablet Take 1 tablet (6.25 mg total) by mouth 2 (two) times daily with a meal. 02/11/16  Yes Theodoro Grist, MD  cetirizine (ZYRTEC) 10 MG tablet Take 10 mg by mouth at bedtime.   Yes Historical Provider, MD  cholecalciferol (VITAMIN D) 1000 units tablet Take 2,000 Units by mouth daily.   Yes Historical Provider, MD  clonazePAM (KLONOPIN) 0.5 MG tablet Take 0.5 mg by mouth 2 (two) times daily as needed for anxiety.   Yes Historical Provider, MD  fluconazole (DIFLUCAN) 200 MG tablet Take 200 mg by mouth once a week.   Yes Historical Provider, MD  fluticasone (FLONASE) 50 MCG/ACT nasal spray Place 1-2 sprays into both nostrils daily as needed for rhinitis.    Yes Historical Provider, MD  furosemide (LASIX) 40 MG tablet Take 40 mg by mouth daily.   Yes Historical Provider, MD  ketorolac (TORADOL) 10 MG tablet Take 1 tablet (10 mg total) by mouth every 8 (eight) hours as needed for moderate pain (with food). 11/05/15  Yes Anne-Caroline Mariea Clonts, MD  lisinopril (PRINIVIL,ZESTRIL) 2.5 MG tablet Take 2.5 mg by mouth daily.   Yes Historical Provider, MD  magnesium gluconate (MAGONATE) 500 MG tablet Take 500 mg by mouth at bedtime.   Yes Historical Provider, MD  metoCLOPramide (REGLAN) 5 MG tablet Take 1 tablet (5 mg total) by mouth every 8 (eight) hours as needed for nausea. 11/05/15 11/04/16 Yes Anne-Caroline Mariea Clonts, MD  naproxen sodium (ANAPROX) 220 MG tablet Take 440 mg by mouth 2 (two) times daily as needed (for headaches/pain).   Yes Historical Provider, MD  nitroGLYCERIN (NITROSTAT) 0.4 MG SL tablet Place 1 tablet (0.4 mg total) under the tongue every 5 (five) minutes as needed for chest pain. 10/24/15  Yes Minna Merritts, MD  potassium chloride (K-DUR) 10 MEQ tablet Take 1 tablet (10 mEq total) by mouth 2 (two) times daily as needed. 10/24/15  Yes Minna Merritts, MD  POTASSIUM PO Take 1 tablet by mouth at bedtime.   Yes Historical Provider, MD  promethazine (PHENERGAN) 25 MG tablet Take 0.5 tablets (12.5  mg total) by mouth every 8 (eight) hours as needed for nausea. 02/19/15  Yes [name], MD  tiZANidine (ZANAFLEX) 4 MG tablet Take 1 tablet (4 mg total) by mouth 3 (three) times daily. 11/23/15 11/22/16 Yes Cari B Triplett, FNP  traMADol (ULTRAM) 50 MG tablet Take 50-100 mg by mouth every 6 (six) hours as needed for moderate pain.   Yes Historical Provider, MD  isosorbide mononitrate (IMDUR) 30 MG 24 hr tablet Take 0.5 tablets (15 mg total) by mouth daily. 02/18/16   Rogelia Mire, NP  Review of Systems    As above, she continues to have intermittent midsternal c/p, esp after meals.  She denies palpitations, dyspnea, pnd, orthopnea, n, v, dizziness, syncope, edema, weight gain, or early satiety. All other systems reviewed and are otherwise negative except as noted above.  Physical Exam    VS:  BP 118/60 mmHg  Pulse 70  Ht 5\' 5"  (1.651 m)  Wt 208 lb (94.348 kg)  BMI 34.61 kg/m2 , BMI Body mass index is 34.61 kg/(m^2). GEN: Well nourished, well developed, in no acute distress. HEENT: normal. Neck: Supple, no JVD, carotid bruits, or masses. Cardiac: RRR, no murmurs, rubs, or gallops. No clubbing, cyanosis, edema.  Radials/DP/PT 2+ and equal bilaterally.  Respiratory:  Respirations regular and unlabored, clear to auscultation bilaterally. GI: Soft, nontender, nondistended, BS + x 4. MS: no deformity or atrophy. Skin: warm and dry, no rash. Neuro:  Strength and sensation are intact. Psych: Normal affect.  Accessory Clinical Findings    ECG - RSR, 1st deg AVB, 69, LVH, no acute st/t changes.  Assessment & Plan    1.  Midsternal Chest Pain/CAD:  H/o CABG x 1 @ time of Ao dissection repair & AVR in 2011.  She was recently admitted with chest pain and r/o.  Myoview was low risk.  Notably, c/p started after a large meal and did improve with nitrates and there was some question as to whether or not esophageal spasm may be playing a role.  Since d/c, she has continued to have intermittent  midsternal c/p w/o assoc Ss, often occuring after a meal, and lasting up to several hours @ a time.  She is on reglan and says that she has been on antacids since her 20's, though it is not on her medicine list.  She has f/u with GI next week.  As her c/p was nitrate responsive, I will give her a Rx for imdur 15 mg daily.  Otw, cont asa, statin, bb, acei.  2.  Chronic combined systolic/diastolic CHF:  Most recent echo showed nl LV fxn.  Euvolemic on exam. Cont bb/acei.  3.  Hypertensive heart Disease:  BP stable.  Cont current regimen.  4.  HL: cont statin therapy.  5.  H/o Asc thoracic aneurysm dissection and chronic AAA dissection:  Both stable by imaging studies during recent hospitalization.  6.  H/o bicuspid Ao Valve s/p bioprosthetic AVR:  Stable by recent echo.  7. Dispo:  F/u Dr. [name] in 2-3 mos as scheduled.  Murray Hodgkins, NP 02/18/2016, 4:42 PM

## 2016-03-03 ENCOUNTER — Other Ambulatory Visit: Payer: Self-pay | Admitting: Cardiovascular Disease

## 2016-03-03 NOTE — Telephone Encounter (Signed)
Requested Prescriptions   Signed Prescriptions Disp Refills  . lisinopril (PRINIVIL,ZESTRIL) 5 MG tablet 135 tablet 3    Sig: TAKE 1/2 TABLET (2.5 MG TOTAL) BY MOUTH DAILY.    Authorizing Provider: Minna Merritts    Ordering User: Britt Bottom

## 2016-03-12 ENCOUNTER — Other Ambulatory Visit: Payer: Self-pay

## 2016-03-12 MED ORDER — CARVEDILOL 6.25 MG PO TABS: 6.2500 mg | ORAL_TABLET | Freq: Two times a day (BID) | ORAL | Status: DC

## 2016-03-12 NOTE — Telephone Encounter (Signed)
Refill sent for Carvedilol 6.25 mg one tablet twice a day #180 with 3 refills.

## 2016-03-15 ENCOUNTER — Other Ambulatory Visit: Payer: Self-pay | Admitting: *Deleted

## 2016-03-15 MED ORDER — CARVEDILOL 12.5 MG PO TABS: 12.5000 mg | ORAL_TABLET | Freq: Two times a day (BID) | ORAL | Status: DC

## 2016-03-18 ENCOUNTER — Other Ambulatory Visit: Payer: Self-pay | Admitting: *Deleted

## 2016-03-18 MED ORDER — CARVEDILOL 6.25 MG PO TABS: 6.2500 mg | ORAL_TABLET | Freq: Two times a day (BID) | ORAL | Status: DC

## 2016-04-22 ENCOUNTER — Ambulatory Visit: Payer: Medicare HMO | Admitting: Cardiovascular Disease

## 2016-04-29 ENCOUNTER — Ambulatory Visit (INDEPENDENT_AMBULATORY_CARE_PROVIDER_SITE_OTHER): Payer: Medicare HMO | Admitting: Cardiovascular Disease

## 2016-04-29 ENCOUNTER — Encounter: Payer: Self-pay | Admitting: Cardiovascular Disease

## 2016-04-29 VITALS — BP 98/66 | HR 71 | Ht 65.0 in | Wt 194.4 lb

## 2016-04-29 DIAGNOSIS — I7103 Dissection of thoracoabdominal aorta: Secondary | ICD-10-CM

## 2016-04-29 DIAGNOSIS — I119 Hypertensive heart disease without heart failure: Secondary | ICD-10-CM

## 2016-04-29 DIAGNOSIS — I10 Essential (primary) hypertension: Secondary | ICD-10-CM

## 2016-04-29 DIAGNOSIS — I2510 Atherosclerotic heart disease of native coronary artery without angina pectoris: Secondary | ICD-10-CM | POA: Diagnosis not present

## 2016-04-29 DIAGNOSIS — I5042 Chronic combined systolic (congestive) and diastolic (congestive) heart failure: Secondary | ICD-10-CM

## 2016-04-29 NOTE — Assessment & Plan Note (Signed)
Blood pressure has been running low. No significant symptoms of orthostasis Recommended she decrease Lasix down to 20 mg daily If she continues to have low pressure and develops orthostasis, may need to hold her ACE inhibitor or take Lasix on a when necessary basis

## 2016-04-29 NOTE — Patient Instructions (Signed)
You are doing well.  Please decrease the lasix down to one a day Extra if needed for fluid retention  Please call us if you have new issues that need to be addressed before your next appt.  Your physician wants you to follow-up in: 6 months.  You will receive a reminder letter in the mail two months in advance. If you don't receive a letter, please call our office to schedule the follow-up appointment.

## 2016-04-29 NOTE — Assessment & Plan Note (Signed)
Stable, followed at [facility] Periodic MRA

## 2016-04-29 NOTE — Progress Notes (Signed)
Patient ID: Rachel Torres, female    DOB: [birth year], 60 y.o.   MRN: [id number]  HPI Comments: 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  She reports pathology of her aortic valve showed bicuspid valve She presents today for follow-up of her aortic valve replacement and CABG.  In follow-up, she reports that her weight is down approximately 10 pounds since changing her diet No carbohydrates, increased exercise She had upper respiratory infection for 2 weeks requiring Levaquin Reports that she stopped her Imdur, this was causing a headache  Denies any significant chest pain  tolerating Lipitor, total cholesterol down to 144  EKG on today's visit shows normal sinus rhythm with rate 71 bpm, no significant ST or T-wave changes Continues to have follow-up at [facility] with MRA  Other past medical history reviewed  Echocardiogram May 2016 shows slightly improved cardiac function, ejection fraction 40-45%.   diagnosed with obstructive sleep apnea. She has a CPAP machine but she is not using it.  Previous evaluations in the emergency room for chest pain. Cardiac enzymes were negative , EKG unchanged .She had a CT scan dated 06/20/2013. She was transferred to [facility] given her complicated anatomy. images were evaluated by vascular surgery it was determined there have been no significant progression of her disease  Previous evaluation at [facility] for shortness of breath and chest discomfort. She had workup for cardiac issues including echocardiogram and  PET stress, CTA of the chest showing stable descending aorta repair with type B. aortic dissection with opacification of the false lumen at the level of the renal arteries via a lumbar artery.  Echocardiogram showed ejection fraction had improved to 45-50%, mild MR, TR (ejection fraction improved from 30-35% in January 2012)  Previous H/o abdominal pain, further workup showing biliary stones and sludge. s/p  laparoscopic cholecystectomy at [facility].  She takes Lasix for urinary retention.   Allergies  Allergen Reactions  . Alendronate Sodium Other (See Comments)    Reaction:  GI upset   . Hydrocod Polst-Cpm Polst Er Itching  . Hydrocodone-Acetaminophen Itching  . Oxycodone Itching  . Peanut-Containing Drug Products Other (See Comments)    Reaction:  Migraines   . Prednisone Rash    Outpatient Encounter Prescriptions as of 04/29/2016  Medication Sig  . albuterol (PROVENTIL HFA;VENTOLIN HFA) 108 (90 Base) MCG/ACT inhaler Inhale 2 puffs into the lungs every 4 (four) hours as needed for wheezing or shortness of breath.  Marland Kitchen aspirin EC 81 MG tablet Take 81 mg by mouth at bedtime.  Marland Kitchen atorvastatin (LIPITOR) 20 MG tablet Take 20 mg by mouth at bedtime.  . benzonatate (TESSALON) 100 MG capsule Take 100 mg by mouth 3 (three) times daily as needed for cough.  . carvedilol (COREG) 6.25 MG tablet Take 1 tablet (6.25 mg total) by mouth 2 (two) times daily with a meal.  . cholecalciferol (VITAMIN D) 1000 units tablet Take 2,000 Units by mouth daily.  . clonazePAM (KLONOPIN) 0.5 MG tablet Take 0.5 mg by mouth 2 (two) times daily as needed for anxiety.  . fluconazole (DIFLUCAN) 200 MG  tablet Take 200 mg by mouth once a week.  . fluticasone (FLONASE) 50 MCG/ACT nasal spray Place 1-2 sprays into both nostrils daily as needed for rhinitis.   . furosemide (LASIX) 40 MG tablet Take 40 mg by mouth daily.  . isosorbide mononitrate (IMDUR) 30 MG 24 hr tablet Take 0.5 tablets (15 mg total) by mouth daily.  Marland Kitchen  lisinopril (PRINIVIL,ZESTRIL) 2.5 MG tablet Take 2.5 mg by mouth daily.  . magnesium gluconate (MAGONATE) 500 MG tablet Take 500 mg by mouth at bedtime.  . naproxen sodium (ANAPROX) 220 MG tablet Take 440 mg by mouth 2 (two) times daily as needed (for headaches/pain).  . nitroGLYCERIN (NITROSTAT) 0.4 MG SL tablet Place 1 tablet (0.4 mg total) under the tongue every 5 (five) minutes as needed for chest pain.  . potassium chloride (K-DUR) 10 MEQ tablet Take 1 tablet (10 mEq total) by mouth 2 (two) times daily as needed.  . promethazine (PHENERGAN) 25 MG tablet Take 0.5 tablets (12.5 mg total) by mouth every 8 (eight) hours as needed for nausea.  . [DISCONTINUED] cetirizine (ZYRTEC) 10 MG tablet Take 10 mg by mouth at bedtime.  . [DISCONTINUED] ALPRAZolam (XANAX) 0.5 MG tablet Take 1 tablet (0.5 mg total) by mouth at bedtime as needed for anxiety or sleep.  . [DISCONTINUED] ketorolac (TORADOL) 10 MG tablet Take 1 tablet (10 mg total) by mouth every 8 (eight) hours as needed for moderate pain (with food).  . [DISCONTINUED] lisinopril (PRINIVIL,ZESTRIL) 5 MG tablet TAKE 1/2 TABLET (2.5 MG TOTAL) BY MOUTH DAILY.  . [DISCONTINUED] metoCLOPramide (REGLAN) 5 MG tablet Take 1 tablet (5 mg total) by mouth every 8 (eight) hours as needed for nausea.  . [DISCONTINUED] POTASSIUM PO Take 1 tablet by mouth at bedtime.  . [DISCONTINUED] tiZANidine (ZANAFLEX) 4 MG tablet Take 1 tablet (4 mg total) by mouth 3 (three) times daily.  . [DISCONTINUED] traMADol (ULTRAM) 50 MG tablet Take 50-100 mg by mouth every 6 (six) hours as needed for moderate pain.   No facility-administered encounter medications on file as of 04/29/2016.    Past Medical History  Diagnosis Date  . Allergy   . Asthma   . Anxiety   . Depression   . GERD (gastroesophageal reflux disease)   . Hyperlipidemia   . Hypertensive heart disease   . Osteoporosis   . Gallbladder sludge   . Hx of repair of dissecting thoracic aortic aneurysm, [place]  type A     a. 08/2010 s/p repair with AVR and VG->RCA; b. 01/2016 CTA: stable appearance of Asc Thoracic Aortic graft. Opacification of flase lumen of chronic abd Ao dissection w/ retrograde flow through lumbar arteries. No aneurysmal dil of flase lumen.  . Marfan syndrome   . RA (rheumatoid arthritis) ([place])     a. Followed by Dr. [name]  . Obstructive sleep apnea   . Migraine   . History of Bicuspid Aortic Valve     a. 08/2010 s/p AVR @ time of Ao dissection repair; b. 01/2016 Echo: Ef 55-65%, nl AoV bioprosthesis, nl RV, nl PASP.  Marland Kitchen CAD (coronary artery disease)     a. 08/2010 s/p CABG x 1 (VG->RCA) @ time of Ao dissection repair; b. 01/2016 Lexiscan MV: mid antsept/apical defect w/ ? peri-infarct ischemia-->likely attenuation-->Med Rx.  Marland Kitchen AAA (abdominal aortic aneurysm) ([place])     a. Chronic w/o evidence of aneurysmal dil on CTA 01/2016.  Marland Kitchen Chronic combined systolic (congestive) and diastolic (congestive) heart failure ([place])     a. 2012  EF 30-35%; b. 04/2015 EF 40-45%; c. 01/2016 Echo: Ef 55-65%, nl AoV bioprosthesis, nl RV, nl PASP.    Past Surgical History  Procedure Laterality Date  . Cesarean section  [place]  . Cardiac catheterization  2001  . Fracture surgery      shoulder replacement  . Orif distal radius fracture    . Abdominal aortic aneurysm repair    . Cholecystectomy      Social History  reports that she has quit smoking. She has never used smokeless tobacco. She reports that she drinks alcohol. She reports that she does not use illicit drugs.  Family History family history includes Aneurysm in her paternal grandmother; Arthritis in her sister; Depression in her mother; Hypertension in her mother; Nephrolithiasis in her sister; Osteoporosis in her mother and sister; Stroke in her mother.   Review of Systems  Constitutional: Positive for unexpected weight change.  Respiratory: Negative.   Cardiovascular: Negative.   Gastrointestinal: Negative.   Musculoskeletal:  Negative.   Skin: Negative.   Neurological: Negative.        Memory problems.  Hematological: Negative.   Psychiatric/Behavioral: Negative.   All other systems reviewed and are negative.  BP 98/66 mmHg  Pulse 71  Ht 5\' 5"  (1.651 m)  Wt 194 lb 6.4 oz (88.179 kg)  BMI 32.35 kg/m2  Physical Exam  Constitutional: She is oriented to person, place, and time. She appears well-developed and well-nourished.  HENT:  Head: Normocephalic.  Nose: Nose normal.  Mouth/Throat: Oropharynx is clear and moist.  Eyes: Conjunctivae are normal. Pupils are equal, round, and reactive to light.  Neck: Normal range of motion. Neck supple. No JVD present.  Cardiovascular: Normal rate, regular rhythm, S1 normal, S2 normal and intact distal pulses.  Exam reveals no gallop and no friction rub.   Murmur heard.  Crescendo systolic murmur is present with a grade of 2/6  Decreased right radial pulse, left lower extremity pulses  Pulmonary/Chest: Effort normal and breath sounds normal. No respiratory distress. She has no wheezes. She has no rales. She exhibits no tenderness.  Well-healed mediastinal scar  Abdominal: Soft. Bowel sounds are normal. She exhibits no distension. There is no tenderness.  Musculoskeletal: Normal range of motion. She exhibits no edema or tenderness.  Lymphadenopathy:    She has no cervical adenopathy.  Neurological: She is alert and oriented to person, place, and time. Coordination normal.  Skin: Skin is warm and dry. No rash noted. No erythema.  Psychiatric: She has a normal mood and affect. Her behavior is normal. Judgment and thought content normal.    Assessment and Plan  Nursing note and vitals reviewed.

## 2016-04-29 NOTE — Assessment & Plan Note (Signed)
She appears euvolemic on today's visit, in fact blood pressure low with mild orthostasis symptoms. We'll decrease Lasix as above, try to continue low-dose ACE inhibitor and beta blocker

## 2016-07-05 ENCOUNTER — Ambulatory Visit (INDEPENDENT_AMBULATORY_CARE_PROVIDER_SITE_OTHER): Payer: Medicare HMO | Admitting: Internal Medicine

## 2016-07-05 ENCOUNTER — Encounter: Payer: Self-pay | Admitting: Internal Medicine

## 2016-07-05 VITALS — BP 104/66 | HR 79 | Temp 97.8°F | Ht 65.0 in | Wt 189.0 lb

## 2016-07-05 DIAGNOSIS — I5042 Chronic combined systolic (congestive) and diastolic (congestive) heart failure: Secondary | ICD-10-CM | POA: Diagnosis not present

## 2016-07-05 DIAGNOSIS — I2510 Atherosclerotic heart disease of native coronary artery without angina pectoris: Secondary | ICD-10-CM | POA: Diagnosis not present

## 2016-07-05 DIAGNOSIS — I10 Essential (primary) hypertension: Secondary | ICD-10-CM

## 2016-07-05 DIAGNOSIS — G43109 Migraine with aura, not intractable, without status migrainosus: Secondary | ICD-10-CM

## 2016-07-05 DIAGNOSIS — M1990 Unspecified osteoarthritis, unspecified site: Secondary | ICD-10-CM

## 2016-07-05 DIAGNOSIS — K644 Residual hemorrhoidal skin tags: Secondary | ICD-10-CM

## 2016-07-05 DIAGNOSIS — F418 Other specified anxiety disorders: Secondary | ICD-10-CM

## 2016-07-05 DIAGNOSIS — K582 Mixed irritable bowel syndrome: Secondary | ICD-10-CM

## 2016-07-05 DIAGNOSIS — K648 Other hemorrhoids: Secondary | ICD-10-CM

## 2016-07-05 DIAGNOSIS — M810 Age-related osteoporosis without current pathological fracture: Secondary | ICD-10-CM

## 2016-07-05 DIAGNOSIS — E785 Hyperlipidemia, unspecified: Secondary | ICD-10-CM

## 2016-07-05 DIAGNOSIS — K219 Gastro-esophageal reflux disease without esophagitis: Secondary | ICD-10-CM

## 2016-07-05 DIAGNOSIS — J4520 Mild intermittent asthma, uncomplicated: Secondary | ICD-10-CM

## 2016-07-05 DIAGNOSIS — M79672 Pain in left foot: Secondary | ICD-10-CM

## 2016-07-05 DIAGNOSIS — G4733 Obstructive sleep apnea (adult) (pediatric): Secondary | ICD-10-CM

## 2016-07-05 DIAGNOSIS — J309 Allergic rhinitis, unspecified: Secondary | ICD-10-CM

## 2016-07-05 DIAGNOSIS — G43809 Other migraine, not intractable, without status migrainosus: Secondary | ICD-10-CM

## 2016-07-05 NOTE — Assessment & Plan Note (Signed)
Will discuss bone density at annual exam

## 2016-07-05 NOTE — Assessment & Plan Note (Signed)
Continue Naproxen for pain and inflammation

## 2016-07-05 NOTE — Assessment & Plan Note (Signed)
Compensated Continue Lasix and Carvedilol

## 2016-07-05 NOTE — Assessment & Plan Note (Signed)
Support offered today Continue Effexor and Xanax Will monitor

## 2016-07-05 NOTE — Assessment & Plan Note (Signed)
Rare  Will monitor

## 2016-07-05 NOTE — Assessment & Plan Note (Signed)
Controlled on Lisinopril Will monitor

## 2016-07-05 NOTE — Assessment & Plan Note (Signed)
Encouraged her to consume a low fat diet Continue Lipitor for now 

## 2016-07-05 NOTE — Assessment & Plan Note (Signed)
She is not wearing CPAP Encouraged her to continue weight loss

## 2016-07-05 NOTE — Assessment & Plan Note (Signed)
She is using OTC without relief She declines RX for Anusol If worsens, consider referral to GI for banding

## 2016-07-05 NOTE — Assessment & Plan Note (Signed)
Continue Flonase as needed

## 2016-07-05 NOTE — Assessment & Plan Note (Signed)
Continue Albuterol as needed 

## 2016-07-05 NOTE — Patient Instructions (Signed)
Plantar Fasciitis With Rehab The plantar fascia is a fibrous, ligament-like, soft-tissue structure that spans the bottom of the foot. Plantar fasciitis, also called heel spur syndrome, is a condition that causes pain in the foot due to inflammation of the tissue. SYMPTOMS   Pain and tenderness on the underneath side of the foot.  Pain that worsens with standing or walking. CAUSES  Plantar fasciitis is caused by irritation and injury to the plantar fascia on the underneath side of the foot. Common mechanisms of injury include:  Direct trauma to bottom of the foot.  Damage to a small nerve that runs under the foot where the main fascia attaches to the heel bone.  Stress placed on the plantar fascia due to bone spurs. RISK INCREASES WITH:   Activities that place stress on the plantar fascia (running, jumping, pivoting, or cutting).  Poor strength and flexibility.  Improperly fitted shoes.  Tight calf muscles.  Flat feet.  Failure to warm-up properly before activity.  Obesity. PREVENTION  Warm up and stretch properly before activity.  Allow for adequate recovery between workouts.  Maintain physical fitness:  Strength, flexibility, and endurance.  Cardiovascular fitness.  Maintain a health body weight.  Avoid stress on the plantar fascia.  Wear properly fitted shoes, including arch supports for individuals who have flat feet. PROGNOSIS  If treated properly, then the symptoms of plantar fasciitis usually resolve without surgery. However, occasionally surgery is necessary. RELATED COMPLICATIONS   Recurrent symptoms that may result in a chronic condition.  Problems of the lower back that are caused by compensating for the injury, such as limping.  Pain or weakness of the foot during push-off following surgery.  Chronic inflammation, scarring, and partial or complete fascia tear, occurring more often from repeated injections. TREATMENT  Treatment initially involves  the use of ice and medication to help reduce pain and inflammation. The use of strengthening and stretching exercises may help reduce pain with activity, especially stretches of the Achilles tendon. These exercises may be performed at home or with a therapist. Your caregiver may recommend that you use heel cups of arch supports to help reduce stress on the plantar fascia. Occasionally, corticosteroid injections are given to reduce inflammation. If symptoms persist for greater than 6 months despite non-surgical (conservative), then surgery may be recommended.  MEDICATION   If pain medication is necessary, then nonsteroidal anti-inflammatory medications, such as aspirin and ibuprofen, or other minor pain relievers, such as acetaminophen, are often recommended.  Do not take pain medication within 7 days before surgery.  Prescription pain relievers may be given if deemed necessary by your caregiver. Use only as directed and only as much as you need.  Corticosteroid injections may be given by your caregiver. These injections should be reserved for the most serious cases, because they may only be given a certain number of times. HEAT AND COLD  Cold treatment (icing) relieves pain and reduces inflammation. Cold treatment should be applied for 10 to 15 minutes every 2 to 3 hours for inflammation and pain and immediately after any activity that aggravates your symptoms. Use ice packs or massage the area with a piece of ice (ice massage).  Heat treatment may be used prior to performing the stretching and strengthening activities prescribed by your caregiver, physical therapist, or athletic trainer. Use a heat pack or soak the injury in warm water. SEEK IMMEDIATE MEDICAL CARE IF:  Treatment seems to offer no benefit, or the condition worsens.  Any medications produce adverse side effects.   EXERCISES RANGE OF MOTION (ROM) AND STRETCHING EXERCISES - Plantar Fasciitis (Heel Spur Syndrome) These exercises may  help you when beginning to rehabilitate your injury. Your symptoms may resolve with or without further involvement from your physician, physical therapist or athletic trainer. While completing these exercises, remember:   Restoring tissue flexibility helps normal motion to return to the joints. This allows healthier, less painful movement and activity.  An effective stretch should be held for at least 30 seconds.  A stretch should never be painful. You should only feel a gentle lengthening or release in the stretched tissue. RANGE OF MOTION - Toe Extension, Flexion  Sit with your right / left leg crossed over your opposite knee.  Grasp your toes and gently pull them back toward the top of your foot. You should feel a stretch on the bottom of your toes and/or foot.  Hold this stretch for __________ seconds.  Now, gently pull your toes toward the bottom of your foot. You should feel a stretch on the top of your toes and or foot.  Hold this stretch for __________ seconds. Repeat __________ times. Complete this stretch __________ times per day.  RANGE OF MOTION - Ankle Dorsiflexion, Active Assisted  Remove shoes and sit on a chair that is preferably not on a carpeted surface.  Place right / left foot under knee. Extend your opposite leg for support.  Keeping your heel down, slide your right / left foot back toward the chair until you feel a stretch at your ankle or calf. If you do not feel a stretch, slide your bottom forward to the edge of the chair, while still keeping your heel down.  Hold this stretch for __________ seconds. Repeat __________ times. Complete this stretch __________ times per day.  STRETCH - Gastroc, Standing  Place hands on wall.  Extend right / left leg, keeping the front knee somewhat bent.  Slightly point your toes inward on your back foot.  Keeping your right / left heel on the floor and your knee straight, shift your weight toward the wall, not allowing your  back to arch.  You should feel a gentle stretch in the right / left calf. Hold this position for __________ seconds. Repeat __________ times. Complete this stretch __________ times per day. STRETCH - S[place]us, Standing  Place hands on wall.  Extend right / left leg, keeping the other knee somewhat bent.  Slightly point your toes inward on your back foot.  Keep your right / left heel on the floor, bend your back knee, and slightly shift your weight over the back leg so that you feel a gentle stretch deep in your back calf.  Hold this position for __________ seconds. Repeat __________ times. Complete this stretch __________ times per day. STRETCH - Gastrocsoleus, Standing  Note: This exercise can place a lot of stress on your foot and ankle. Please complete this exercise only if specifically instructed by your caregiver.   Place the ball of your right / left foot on a step, keeping your other foot firmly on the same step.  Hold on to the wall or a rail for balance.  Slowly lift your other foot, allowing your body weight to press your heel down over the edge of the step.  You should feel a stretch in your right / left calf.  Hold this position for __________ seconds.  Repeat this exercise with a slight bend in your right / left knee. Repeat __________ times. Complete this stretch __________ times   per day.  STRENGTHENING EXERCISES - Plantar Fasciitis (Heel Spur Syndrome)  These exercises may help you when beginning to rehabilitate your injury. They may resolve your symptoms with or without further involvement from your physician, physical therapist or athletic trainer. While completing these exercises, remember:   Muscles can gain both the endurance and the strength needed for everyday activities through controlled exercises.  Complete these exercises as instructed by your physician, physical therapist or athletic trainer. Progress the resistance and repetitions only as  guided. STRENGTH - Towel Curls  Sit in a chair positioned on a non-carpeted surface.  Place your foot on a towel, keeping your heel on the floor.  Pull the towel toward your heel by only curling your toes. Keep your heel on the floor.  If instructed by your physician, physical therapist or athletic trainer, add ____________________ at the end of the towel. Repeat __________ times. Complete this exercise __________ times per day. STRENGTH - Ankle Inversion  Secure one end of a rubber exercise band/tubing to a fixed object (table, pole). Loop the other end around your foot just before your toes.  Place your fists between your knees. This will focus your strengthening at your ankle.  Slowly, pull your big toe up and in, making sure the band/tubing is positioned to resist the entire motion.  Hold this position for __________ seconds.  Have your muscles resist the band/tubing as it slowly pulls your foot back to the starting position. Repeat __________ times. Complete this exercises __________ times per day.    This information is not intended to replace advice given to you by your health care provider. Make sure you discuss any questions you have with your health care provider.   Document Released: 12/06/2005 Document Revised: 04/22/2015 Document Reviewed: 03/20/2009 [employer] Interactive Patient Education 2016 [employer]  

## 2016-07-05 NOTE — Assessment & Plan Note (Signed)
No angina LDL reviewed Continue Lipitor and ASA

## 2016-07-05 NOTE — Progress Notes (Signed)
HPI  Pt presents to the clinic today to establish care. She is transferring care from [facility]. She was seeing Dr. Glori Bickers prior to that.  Asthma, mild intermittent: Usually on flares up when she gets a cold. She only uses the Albuterol when she gets sick.  Anxiety: Chronic but stable on Effexor and Xanax. She usually only takes the Xanax 1-2 times per month.  HTN: BP controlled on Lisinopril. Her BP today is 104/66. ECG from 04/2016 reviewed.   GERD: Controlled on Prilosec. She reports she has had an upper GI in the past, no evidence of Barrett's esophagus. She reports she did have a small hiatal hernia.  IBS: Alternating constipation and diarrhea. She does not take anything OTC for this.  Ocular Migraines: Only occur 1-2 times per year. Located behind the right eye. She does have visual changes but denies pain. She does have sensitivity to light, sound, nausea and vomiting. She takes Percocet and Benadryl with good relief.  HLD: She denies myalgias on Lipitor. She does try to consume a low fat diet.  OSA: She has a CPAP but reports she has not used it since she lost 20 lbs. She is sleeping well at night and denies daytime fatigue.   CAD: s/p cath. She denies angina. She does have Nitro to take but has not needed it. She is taking a baby ASA daily.  CHF, combined: She denies swelling or shortness of breath. She takes Carvedilol and Lasix as prescribed.  Seasonal Allergies: Worse in the spring. She takes Flonase only if needed.  Osteoporosis: Her last bone density was > 2 years ago. She takes Vit D OTC but no RX for osteoporosis.  RA: She follows with Dr. [name]. She did not tolerate MTX. She takes Naproxen as needed.  Flu: 08/2015 Tetanus: 2010 Pneumovax: 2008 Pap Smear: 07/2015 Mammogram: 07/2015 Colon Screening: 2013 Vision Screening: yearly Dentist: annually  Past Medical History  Diagnosis Date  . Allergy   . Asthma   . Anxiety   . Depression   . GERD  (gastroesophageal reflux disease)   . Hyperlipidemia   . Hypertensive heart disease   . Osteoporosis   . Gallbladder sludge   . Hx of repair of dissecting thoracic aortic aneurysm, Stanford type A     a. 08/2010 s/p repair with AVR and VG->RCA; b. 01/2016 CTA: stable appearance of Asc Thoracic Aortic graft. Opacification of flase lumen of chronic abd Ao dissection w/ retrograde flow through lumbar arteries. No aneurysmal dil of flase lumen.  . Marfan syndrome   . RA (rheumatoid arthritis) ([place])     a. Followed by Dr. [name]  . Obstructive sleep apnea   . Migraine   . History of Bicuspid Aortic Valve     a. 08/2010 s/p AVR @ time of Ao dissection repair; b. 01/2016 Echo: Ef 55-65%, nl AoV bioprosthesis, nl RV, nl PASP.  Marland Kitchen CAD (coronary artery disease)     a. 08/2010 s/p CABG x 1 (VG->RCA) @ time of Ao dissection repair; b. 01/2016 Lexiscan MV: mid antsept/apical defect w/ ? peri-infarct ischemia-->likely attenuation-->Med Rx.  Marland Kitchen AAA (abdominal aortic aneurysm) ([place])     a. Chronic w/o evidence of aneurysmal dil on CTA 01/2016.  Marland Kitchen Chronic combined systolic (congestive) and diastolic (congestive) heart failure ([place])     a. 2012 EF 30-35%; b. 04/2015 EF 40-45%; c. 01/2016 Echo: Ef 55-65%, nl AoV bioprosthesis, nl RV, nl PASP.    Current Outpatient Prescriptions  Medication Sig Dispense Refill  .  albuterol (PROVENTIL HFA;VENTOLIN HFA) 108 (90 Base) MCG/ACT inhaler Inhale 2 puffs into the lungs every 4 (four) hours as needed for wheezing or shortness of breath.    . ALPRAZolam (XANAX) 0.5 MG tablet Take 0.5 mg by mouth at bedtime as needed for anxiety.    Marland Kitchen aspirin EC 81 MG tablet Take 81 mg by mouth at bedtime.    . benzonatate (TESSALON) 100 MG capsule Take 100 mg by mouth 3 (three) times daily as needed for cough.    . carvedilol (COREG) 6.25 MG tablet Take 1 tablet (6.25 mg total) by mouth 2 (two) times daily with a meal. 180 tablet 3  . cholecalciferol (VITAMIN D) 1000 units tablet Take 2,000  Units by mouth daily.    . furosemide (LASIX) 40 MG tablet Take 40 mg by mouth daily.    Marland Kitchen lisinopril (PRINIVIL,ZESTRIL) 2.5 MG tablet Take 2.5 mg by mouth daily.    . magnesium gluconate (MAGONATE) 500 MG tablet Take 500 mg by mouth at bedtime.    . Melatonin 3 MG CAPS Take 1 capsule by mouth at bedtime as needed.    . Misc Natural Products (TART CHERRY ADVANCED) CAPS Take 1 capsule by mouth daily.    . naproxen sodium (ANAPROX) 220 MG tablet Take 440 mg by mouth 2 (two) times daily as needed (for headaches/pain).    . nitroGLYCERIN (NITROSTAT) 0.4 MG SL tablet Place 1 tablet (0.4 mg total) under the tongue every 5 (five) minutes as needed for chest pain. 25 tablet 3  . omeprazole (PRILOSEC) 40 MG capsule Take 1 capsule by mouth daily.    Marland Kitchen oxyCODONE-acetaminophen (PERCOCET/ROXICET) 5-325 MG tablet Take 2 tablets by mouth every 8 (eight) hours as needed for severe pain.    . potassium chloride (K-DUR) 10 MEQ tablet Take 1 tablet (10 mEq total) by mouth 2 (two) times daily as needed. 180 tablet 3  . promethazine (PHENERGAN) 25 MG tablet Take 0.5 tablets (12.5 mg total) by mouth every 8 (eight) hours as needed for nausea. 30 tablet 1  . venlafaxine XR (EFFEXOR-XR) 75 MG 24 hr capsule Take 75 mg by mouth daily with breakfast.    . atorvastatin (LIPITOR) 20 MG tablet Take 20 mg by mouth at bedtime. Reported on 07/05/2016     No current facility-administered medications for this visit.    Allergies  Allergen Reactions  . Alendronate Sodium Other (See Comments)    Reaction:  GI upset   . Hydrocod Polst-Cpm Polst Er Itching  . Hydrocodone-Acetaminophen Itching  . Oxycodone Itching  . Peanut-Containing Drug Products Other (See Comments)    Reaction:  Migraines   . Prednisone Rash    Family History  Problem Relation Age of Onset  . Hypertension Mother   . Depression Mother   . Osteoporosis Mother   . Stroke Mother   . Irritable bowel syndrome Mother   . Arthritis Sister   . Diabetes  Sister   . Aneurysm Paternal Grandmother   . Diabetes Paternal Grandmother   . Arthritis Paternal Grandmother   . Nephrolithiasis Sister   . Osteoporosis Sister   . Arthritis Sister   . Stroke Maternal Grandmother   . Colon cancer Maternal Grandmother   . Lymphoma Maternal Grandmother   . Arthritis Paternal Grandfather     Social History   Social History  . Marital Status: Married    Spouse Name: N/A  . Number of Children: 2  . Years of Education: N/A   Occupational History  .  Retail    Social History Main Topics  . Smoking status: Never Smoker   . Smokeless tobacco: Never Used  . Alcohol Use: 0.0 oz/week    0 Standard drinks or equivalent per week     Comment: occassional  . Drug Use: No  . Sexual Activity: Not on file   Other Topics Concern  . Not on file   Social History Narrative    ROS:  Constitutional: Denies fever, malaise, fatigue, headache or abrupt weight changes.  HEENT: Denies eye pain, eye redness, ear pain, ringing in the ears, wax buildup, runny nose, nasal congestion, bloody nose, or sore throat. Respiratory: Denies difficulty breathing, shortness of breath, cough or sputum production.   Cardiovascular: Denies chest pain, chest tightness, palpitations or swelling in the hands or feet.  Gastrointestinal: Pt reports external hemorrhoids. Denies abdominal pain, bloating, constipation, diarrhea or blood in the stool.  GU: Pt reports urge incontinence. Denies frequency, urgency, pain with urination, blood in urine, odor or discharge. Musculoskeletal: Pt reports left heel pain. Denies decrease in range of motion, difficulty with gait, muscle pain or joint swelling.  Skin: Denies redness, rashes, lesions or ulcercations.  Neurological: Denies dizziness, difficulty with memory, difficulty with speech or problems with balance and coordination.  Psych: Pt has history of depression. Denies anxiety, SI/HI.  No other specific complaints in a complete review of  systems (except as listed in HPI above).  PE:  BP 104/66 mmHg  Pulse 79  Temp(Src) 97.8 F (36.6 C) (Oral)  Ht 5\' 5"  (1.651 m)  Wt 189 lb (85.73 kg)  BMI 31.45 kg/m2  SpO2 98%  Wt Readings from Last 3 Encounters:  07/05/16 189 lb (85.73 kg)  04/29/16 194 lb 6.4 oz (88.179 kg)  02/18/16 208 lb (94.348 kg)    General: Appears her stated age, well developed, well nourished in NAD. HEENT: Head: normal shape and size; Eyes: sclera white, no icterus, conjunctiva pink, PERRLA and EOMs intact; Ears: Tm's gray and intact, normal light reflex;Throat/Mouth: Teeth present, mucosa pink and moist, no lesions or ulcerations noted.  Cardiovascular: Normal rate and rhythm. S1,S2 noted. Murmur noted.  Pulmonary/Chest: Normal effort and positive vesicular breath sounds. No respiratory distress. No wheezes, rales or ronchi noted.  Abdomen: Soft and nontender.  Musculoskeletal:  No difficulty with gait.  Neurological: Alert and oriented. Psychiatric: Mood and affect normal. Behavior is normal. Judgment and thought content normal.    BMET    Component Value Date/Time   NA 141 02/10/2016 0028   NA 143 06/20/2013 0815   NA 139 08/09/2012 1434   K 3.6 02/10/2016 0028   K 3.8 06/20/2013 0815   CL 107 02/10/2016 0028   CL 107 06/20/2013 0815   CO2 28 02/10/2016 0028   CO2 26 06/20/2013 0815   GLUCOSE 141* 02/10/2016 0028   GLUCOSE 106* 06/20/2013 0815   GLUCOSE 99 08/09/2012 1434   GLUCOSE 90 12/29/2006 1251   BUN 22* 02/10/2016 0028   BUN 16 06/20/2013 0815   BUN 14 08/09/2012 1434   CREATININE 1.00 02/10/2016 0028   CREATININE 0.94 06/20/2013 0815   CREATININE 0.88 04/09/2011 0900   CALCIUM 8.8* 02/10/2016 0028   CALCIUM 8.8 06/20/2013 0815   GFRNONAA >60 02/10/2016 0028   GFRNONAA >60 06/20/2013 0815   GFRAA >60 02/10/2016 0028   GFRAA >60 06/20/2013 0815    Lipid Panel     Component Value Date/Time   CHOL 144 10/27/2015 0838   CHOL 179 07/03/2012 0840   TRIG  87 10/27/2015  0838   TRIG 110 12/29/2006 1251   HDL 46 10/27/2015 0838   HDL 50.40 07/03/2012 0840   CHOLHDL 3.1 10/27/2015 0838   CHOLHDL 4 07/03/2012 0840   CHOLHDL 5.2 CALC 12/29/2006 1251   VLDL 25.0 07/03/2012 0840   LDLCALC 81 10/27/2015 0838   LDLCALC 104* 07/03/2012 0840    CBC    Component Value Date/Time   WBC 6.7 02/10/2016 0028   WBC 7.1 06/20/2013 0815   RBC 3.77* 02/10/2016 0028   RBC 3.64* 06/20/2013 0815   HGB 11.9* 02/10/2016 0028   HGB 11.7* 06/20/2013 0815   HCT 35.0 02/10/2016 0028   HCT 34.3* 06/20/2013 0815   PLT 194 02/10/2016 0028   PLT 217 06/20/2013 0815   MCV 92.8 02/10/2016 0028   MCV 94 06/20/2013 0815   MCH 31.7 02/10/2016 0028   MCH 32.2 06/20/2013 0815   MCHC 34.1 02/10/2016 0028   MCHC 34.1 06/20/2013 0815   RDW 13.4 02/10/2016 0028   RDW 13.2 06/20/2013 0815   LYMPHSABS 1.5 07/03/2012 0840   LYMPHSABS 1.4 05/12/2012 1212   MONOABS 0.5 07/03/2012 0840   MONOABS 0.4 05/12/2012 1212   EOSABS 0.2 07/03/2012 0840   EOSABS 0.2 05/12/2012 1212   BASOSABS 0.0 07/03/2012 0840   BASOSABS 0.0 05/12/2012 1212    Hgb A1C Lab Results  Component Value Date   HGBA1C 5.4 02/10/2016     Assessment and Plan:  Left heel pain:  Plantar fasciitis Referral placed to ortho  Urge incontinence:  Instructed on Kegels Will check for Cystocele at pelvic exam  Make an appt for your annual exam

## 2016-07-05 NOTE — Assessment & Plan Note (Signed)
Discussed weaning her Prilosec She is not interested in this at this time

## 2016-07-05 NOTE — Progress Notes (Signed)
Pre visit review using our clinic review tool, if applicable. No additional management support is needed unless otherwise documented below in the visit note. 

## 2016-07-05 NOTE — Assessment & Plan Note (Signed)
Consider starting a Probiotic daily

## 2016-07-12 ENCOUNTER — Encounter: Payer: Self-pay | Admitting: Family Medicine

## 2016-07-12 ENCOUNTER — Ambulatory Visit (INDEPENDENT_AMBULATORY_CARE_PROVIDER_SITE_OTHER): Payer: Medicare HMO | Admitting: Family Medicine

## 2016-07-12 VITALS — BP 122/68 | HR 85 | Temp 98.0°F | Ht 65.0 in | Wt 186.0 lb

## 2016-07-12 DIAGNOSIS — R61 Generalized hyperhidrosis: Secondary | ICD-10-CM

## 2016-07-12 DIAGNOSIS — I10 Essential (primary) hypertension: Secondary | ICD-10-CM

## 2016-07-12 DIAGNOSIS — IMO0001 Reserved for inherently not codable concepts without codable children: Secondary | ICD-10-CM | POA: Insufficient documentation

## 2016-07-12 DIAGNOSIS — R739 Hyperglycemia, unspecified: Secondary | ICD-10-CM

## 2016-07-12 LAB — TSH: TSH: 0.77 u[IU]/mL (ref 0.35–4.50)

## 2016-07-12 LAB — HEMOGLOBIN A1C: Hgb A1c MFr Bld: 5.3 % (ref 4.6–6.5)

## 2016-07-12 LAB — T4, FREE: Free T4: 0.81 ng/dL (ref 0.60–1.60)

## 2016-07-12 NOTE — Progress Notes (Signed)
Pre visit review using our clinic review tool, if applicable. No additional management support is needed unless otherwise documented below in the visit note. 

## 2016-07-12 NOTE — Assessment & Plan Note (Signed)
bp in fair control at this time  BP Readings from Last 1 Encounters:  07/12/16 122/68   No changes needed Disc lifstyle change with low sodium diet and exercise

## 2016-07-12 NOTE — Progress Notes (Signed)
Subjective:    Patient ID: Rachel Torres, female    DOB: [birth year], 60 y.o.   MRN: [id number]  HPI Here with concerns about her thyroid   ? If thyroid is also enlarged - per another specialist  Necklaces bother her   Her neck stays hot all the time - sweats like crazy all over  Is menopausal - she had menopause 16 years ago  Not sleeping well either   Wt Readings from Last 3 Encounters:  07/12/16 186 lb (84.4 kg)  07/05/16 189 lb (85.7 kg)  04/29/16 194 lb 6.4 oz (88.2 kg)   bmi of 30.9 Trying to loose weight - low carb / high fat  Wants to be screened for diabetes- runs in her family  Obese range   Blood sugar was 141 in the hospital 2/17- A1C 5.4   Lab Results  Component Value Date   TSH 0.67 10/13/2011    Is tired all the time (? Not sleeping well)  Mood is better- with anti depressants  Not very depressed or very irritable   No new tremor- has an essential tremor   Patient Active Problem List   Diagnosis Date Noted  . Sweating 07/12/2016  . CAD (coronary artery disease)   . AAA (abdominal aortic aneurysm) ([place])   . Chronic combined systolic (congestive) and diastolic (congestive) heart failure ([place])   . Hyperglycemia 02/11/2016  . Aortic dissection ([place]) 05/12/2012  . S/P AVR (aortic valve replacement) 01/07/2011  . CORONARY ARTERY BYPASS GRAFT, HX OF 01/07/2011  . IRRITABLE BOWEL SYNDROME 09/09/2009  . Obstructive sleep apnea 08/04/2009  . External hemorrhoid 06/26/2009  . Osteoporosis 01/09/2009  . Hyperlipidemia 07/26/2007  . Depression with anxiety 07/26/2007  . Essential hypertension 07/26/2007  . Allergic rhinitis 07/26/2007  . Asthma 07/26/2007  . GERD 07/26/2007  . Osteoarthritis 07/26/2007  . Ocular migraine 07/26/2007   Past Medical History:  Diagnosis Date  . AAA (abdominal aortic aneurysm) ([place])    a. Chronic w/o evidence of aneurysmal dil on CTA 01/2016.  Marland Kitchen Allergy   . Anxiety   . Asthma   . CAD (coronary artery disease)    a.  08/2010 s/p CABG x 1 (VG->RCA) @ time of Ao dissection repair; b. 01/2016 Lexiscan MV: mid antsept/apical defect w/ ? peri-infarct ischemia-->likely attenuation-->Med Rx.  . Chronic combined systolic (congestive) and diastolic (congestive) heart failure ([place])    a. 2012 EF 30-35%; b. 04/2015 EF 40-45%; c. 01/2016 Echo: Ef 55-65%, nl AoV bioprosthesis, nl RV, nl PASP.  Marland Kitchen Depression   . Gallbladder sludge   . GERD (gastroesophageal reflux disease)   . History of Bicuspid Aortic Valve    a. 08/2010 s/p AVR @ time of Ao dissection repair; b. 01/2016 Echo: Ef 55-65%, nl AoV bioprosthesis, nl RV, nl PASP.  Marland Kitchen Hx of repair of dissecting thoracic aortic aneurysm, Stanford type A    a. 08/2010 s/p repair with AVR and VG->RCA; b. 01/2016 CTA: stable appearance of Asc Thoracic Aortic graft. Opacification of flase lumen of chronic abd Ao dissection w/ retrograde flow through lumbar arteries. No aneurysmal dil of flase lumen.  . Hyperlipidemia   . Hypertensive heart disease   . Marfan syndrome   . Migraine   . Obstructive sleep apnea   . Osteoporosis   . RA (rheumatoid arthritis) ([place])    a. Followed by Dr. [name]   Past Surgical History:  Procedure Laterality Date  . ABDOMINAL AORTIC ANEURYSM REPAIR    . CARDIAC CATHETERIZATION  [place]  . CHOLECYSTECTOMY    . FRACTURE SURGERY     shoulder replacement  . ORIF DISTAL RADIUS FRACTURE     Social History  Substance Use Topics  . Smoking status: Never Smoker  . Smokeless tobacco: Never Used  . Alcohol use 0.0 oz/week     Comment: occassional   Family History  Problem Relation Age of Onset  . Hypertension Mother   . Depression Mother   . Osteoporosis Mother   . Stroke Mother   . Irritable bowel syndrome Mother   . Arthritis Sister   . Diabetes Sister   . Aneurysm Paternal Grandmother   . Diabetes Paternal Grandmother   . Arthritis Paternal Grandmother   . Nephrolithiasis Sister   . Osteoporosis Sister   .  Arthritis Sister   . Stroke Maternal Grandmother   . Colon cancer Maternal Grandmother   . Lymphoma Maternal Grandmother   . Arthritis Paternal Grandfather    Allergies  Allergen Reactions  . Alendronate Sodium Other (See Comments)    Reaction:  GI upset   . Hydrocod Polst-Cpm Polst Er Itching  . Hydrocodone-Acetaminophen Itching  . Oxycodone Itching  . Peanut-Containing Drug Products Other (See Comments)    Reaction:  Migraines   . Prednisone Rash   Current Outpatient Prescriptions on File Prior to Visit  Medication Sig Dispense Refill  . albuterol (PROVENTIL HFA;VENTOLIN HFA) 108 (90 Base) MCG/ACT inhaler Inhale 2 puffs into the lungs every 4 (four) hours as needed for wheezing or shortness of breath.    . ALPRAZolam (XANAX) 0.5 MG tablet Take 0.5 mg by mouth at bedtime as needed for anxiety.    Marland Kitchen aspirin EC 81 MG tablet Take 81 mg by mouth at bedtime.    Marland Kitchen atorvastatin (LIPITOR) 20 MG tablet Take 20 mg by mouth at bedtime. Reported on 07/05/2016    . benzonatate (TESSALON) 100 MG capsule Take 100 mg by mouth 3 (three) times daily as needed for cough.    . carvedilol (COREG) 6.25 MG tablet Take 1 tablet (6.25 mg total) by mouth 2 (two) times daily with a meal. 180 tablet 3  . cholecalciferol (VITAMIN D) 1000 units tablet Take 2,000 Units by mouth daily.    . furosemide (LASIX) 40 MG tablet Take 40 mg by mouth daily.    Marland Kitchen lisinopril (PRINIVIL,ZESTRIL) 2.5 MG tablet Take 2.5 mg by mouth daily.    . magnesium gluconate (MAGONATE) 500 MG tablet Take 500 mg by mouth at bedtime.    . Melatonin 3 MG CAPS Take 1 capsule by mouth at bedtime as needed.    . Misc Natural Products (TART CHERRY ADVANCED) CAPS Take 1 capsule by mouth daily.    . naproxen sodium (ANAPROX) 220 MG tablet Take 440 mg by mouth 2 (two) times daily as needed (for headaches/pain).    . nitroGLYCERIN (NITROSTAT) 0.4 MG SL tablet Place 1 tablet (0.4 mg total) under the tongue every 5 (five) minutes as needed for chest pain.  25 tablet 3  . omeprazole (PRILOSEC) 40 MG capsule Take 1 capsule by mouth daily.    Marland Kitchen oxyCODONE-acetaminophen (PERCOCET/ROXICET) 5-325 MG tablet Take 2 tablets by mouth every 8 (eight) hours as needed for severe pain.    . potassium chloride (K-DUR) 10 MEQ tablet Take 1 tablet (10 mEq total) by mouth 2 (two) times daily as needed. 180 tablet 3  . promethazine (PHENERGAN) 25 MG tablet Take 0.5 tablets (12.5 mg total) by  mouth every 8 (eight) hours as needed for nausea. 30 tablet 1  . venlafaxine XR (EFFEXOR-XR) 75 MG 24 hr capsule Take 75 mg by mouth daily with breakfast.     No current facility-administered medications on file prior to visit.      Review of Systems Review of Systems  Constitutional: Negative for fever, appetite change, and unexpected weight change. Pos for fatigue and heat intolerance and poor sleep Eyes: Negative for pain and visual disturbance.  Respiratory: Negative for cough and shortness of breath.   Cardiovascular: Negative for cp or palpitations    Gastrointestinal: Negative for nausea, diarrhea and constipation.  Genitourinary: Negative for urgency and frequency.  Skin: Negative for pallor or rash   Neurological: Negative for weakness, light-headedness, numbness and headaches. pos for mild hand tremor (chronic) Hematological: Negative for adenopathy. Does not bruise/bleed easily.  Psychiatric/Behavioral: Negative for dysphoric mood. The patient is not nervous/anxious.         Objective:   Physical Exam  Constitutional: She appears well-developed and well-nourished. No distress.  overwt and well app  HENT:  Head: Normocephalic and atraumatic.  Mouth/Throat: Oropharynx is clear and moist.  Eyes: Conjunctivae and EOM are normal. Pupils are equal, round, and reactive to light.  Neck: Normal range of motion. Neck supple. No JVD present. Carotid bruit is not present. No thyromegaly present.  No thyroid asymmetry or nodules noted on exam No tenderness or bruit    Cardiovascular: Normal rate, regular rhythm and intact distal pulses.  Exam reveals no gallop.   Murmur heard. Pulmonary/Chest: Effort normal and breath sounds normal. No respiratory distress. She has no wheezes. She has no rales.  No crackles  Abdominal: Soft. Bowel sounds are normal. She exhibits no distension, no abdominal bruit and no mass. There is no tenderness.  Musculoskeletal: She exhibits no edema.  Lymphadenopathy:    She has no cervical adenopathy.  Neurological: She is alert. She has normal reflexes.  Skin: Skin is warm and dry. No rash noted.  Psychiatric: She has a normal mood and affect.          Assessment & Plan:   Problem List Items Addressed This Visit      Cardiovascular and Mediastinum   Essential hypertension - Primary    bp in fair control at this time  BP Readings from Last 1 Encounters:  07/12/16 122/68   No changes needed Disc lifstyle change with low sodium diet and exercise          Musculoskeletal and Integument   Sweating    Pt wonders if thyroid related Feels tired/ but has trouble sleeping Nl exam- I did not appreciate goiter  TSH today with free T4 Disc poss of menopausal vasomotor symptoms as the cause        Relevant Orders   TSH (Completed)   T4, Free (Completed)     Other   Hyperglycemia    Noted during last hospitalization This was in the hospital Strong fam hx of DM Eating better and working on wt loss Exercise encouraged A1C today      Relevant Orders   Hemoglobin A1c (Completed)    Other Visit Diagnoses   None.

## 2016-07-12 NOTE — Assessment & Plan Note (Signed)
Noted during last hospitalization This was in the hospital Strong fam hx of DM Eating better and working on wt loss Exercise encouraged A1C today

## 2016-07-12 NOTE — Assessment & Plan Note (Addendum)
Pt wonders if thyroid related Feels tired/ but has trouble sleeping Nl exam- I did not appreciate goiter  TSH today with free T4 Disc poss of menopausal vasomotor symptoms as the cause

## 2016-07-12 NOTE — Patient Instructions (Signed)
Keep working on healthy habits  We will keep an eye on thyroid size  Labs today for thyroid and glucose control

## 2016-07-13 ENCOUNTER — Encounter: Payer: Self-pay | Admitting: *Deleted

## 2016-07-13 ENCOUNTER — Other Ambulatory Visit: Payer: Self-pay | Admitting: Cardiovascular Disease

## 2016-07-14 ENCOUNTER — Encounter: Payer: Self-pay | Admitting: Family Medicine

## 2016-07-14 NOTE — Telephone Encounter (Signed)
[facility] Reports   Date/Time Report Action User  04/29/2016 2:24 PM After Visit Summary Printed Minna Merritts, MD  Patient Instructions   You are doing well.  Please decrease the lasix down to one a day Extra if needed for fluid retention

## 2016-07-15 ENCOUNTER — Telehealth: Payer: Self-pay | Admitting: Family Medicine

## 2016-07-15 MED ORDER — ALPRAZOLAM 0.5 MG PO TABS: 0.5000 mg | ORAL_TABLET | Freq: Every day | ORAL | 0 refills | Status: DC | PRN

## 2016-07-15 NOTE — Telephone Encounter (Signed)
Please call in xanax and let pt know

## 2016-07-15 NOTE — Telephone Encounter (Signed)
Check with [facility] and the last Rx for xanax was prescribed from you, pt didn't have any refills from her [facility] doc. Pharmacist advise last prescribed on 02/19/15 #30 with 1 additional refill from you and pt last picked up Rx on 07/25/15 #30, and no other refills or Rxs from any other docs in their system

## 2016-07-15 NOTE — Telephone Encounter (Signed)
Rx called in as prescribed and pt notified  

## 2016-07-22 DIAGNOSIS — M722 Plantar fascial fibromatosis: Secondary | ICD-10-CM | POA: Diagnosis not present

## 2016-08-02 ENCOUNTER — Other Ambulatory Visit: Payer: Self-pay | Admitting: Family Medicine

## 2016-08-02 NOTE — Telephone Encounter (Signed)
[name] cardiologist has been filling Rx in the past. Refill request routed to Dr. [name]

## 2016-08-03 ENCOUNTER — Ambulatory Visit (INDEPENDENT_AMBULATORY_CARE_PROVIDER_SITE_OTHER): Payer: Medicare HMO | Admitting: Family Medicine

## 2016-08-03 ENCOUNTER — Encounter: Payer: Self-pay | Admitting: Family Medicine

## 2016-08-03 VITALS — BP 118/76 | HR 66 | Temp 97.7°F | Ht 65.0 in | Wt 184.8 lb

## 2016-08-03 DIAGNOSIS — G43809 Other migraine, not intractable, without status migrainosus: Secondary | ICD-10-CM | POA: Diagnosis not present

## 2016-08-03 DIAGNOSIS — G43009 Migraine without aura, not intractable, without status migrainosus: Secondary | ICD-10-CM

## 2016-08-03 MED ORDER — CYCLOBENZAPRINE HCL 10 MG PO TABS: 5.0000 mg | ORAL_TABLET | Freq: Three times a day (TID) | ORAL | 1 refills | Status: DC | PRN

## 2016-08-03 MED ORDER — PROMETHAZINE HCL 25 MG PO TABS: 12.5000 mg | ORAL_TABLET | Freq: Three times a day (TID) | ORAL | 1 refills | Status: DC | PRN

## 2016-08-03 NOTE — Progress Notes (Signed)
Subjective:     Patient ID: Rachel Torres, female   DOB: [birth year], 60 y.o.   MRN: [id number]  HPI   Review of Systems     Objective:   Physical Exam     Assessment:     See above    Plan:     See above

## 2016-08-03 NOTE — Patient Instructions (Signed)
Avoid caffeine (unless you have a headache) Drink more water  Naproxen for a headache is ok - with food  Avoid narcotics-they tend to lower threshold for headache for months  Phenergan is ok for headache (with or without nausea) Try flexeril for headache Make sure you have a cervical support pillow  Try to go to bed and get up at the same time every day

## 2016-08-03 NOTE — Progress Notes (Signed)
Subjective:    Patient ID: Rachel Torres, female    DOB: [birth year], 60 y.o.   MRN: [id number]  HPI Here for headaches   Gets a shooting /stabbing pain starting at L occiput area and it shoots to the top of the head  Severe - for seconds (last one a few days ago)  Then it turns into a dull ache (constant, not throbbing)-same area  Not similar to her opthy migraines she used to have with aura  Vision almost blacks out for a second   Gets the sharp pain a few times per month  Last one was getting hair done  Dull pain usually lasts a day or two   Has had atypical migraines in the past- better since going through menopause   Was on proph agent-unsure what   For the dull pain - takes naproxen  occ tylenol  Took one percocet (with benadryl) on SAT   Has not taken phenergan for a headache in a while   She gets a little nauseated/ no vomiting   Does drink caffeine daily - drinks mostly decaf so probably 1 cup per day  Drinks water  Sleep habits - all over the place  Exercise -none right now   MRA neck in 2015-normal  MRA of head in 2015 - some aging white matter changes/no M or aneurysm  Reassuring   Patient Active Problem List   Diagnosis Date Noted  . Atypical migraine 08/03/2016  . Sweating 07/12/2016  . CAD (coronary artery disease)   . AAA (abdominal aortic aneurysm) ([place])   . Chronic combined systolic (congestive) and diastolic (congestive) heart failure ([place])   . Hyperglycemia 02/11/2016  . Aortic dissection ([place]) 05/12/2012  . S/P AVR (aortic valve replacement) 01/07/2011  . CORONARY ARTERY BYPASS GRAFT, HX OF 01/07/2011  . IRRITABLE BOWEL SYNDROME 09/09/2009  . Obstructive sleep apnea 08/04/2009  . External hemorrhoid 06/26/2009  . Osteoporosis 01/09/2009  . Hyperlipidemia 07/26/2007  . Depression with anxiety 07/26/2007  . Essential hypertension 07/26/2007  . Allergic rhinitis 07/26/2007  . Asthma 07/26/2007  . GERD 07/26/2007  . Osteoarthritis  07/26/2007  . Ocular migraine 07/26/2007   Past Medical History:  Diagnosis Date  . AAA (abdominal aortic aneurysm) ([place])    a. Chronic w/o evidence of aneurysmal dil on CTA 01/2016.  Marland Kitchen Allergy   . Anxiety   . Asthma   . CAD (coronary artery disease)    a. 08/2010 s/p CABG x 1 (VG->RCA) @ time of Ao dissection repair; b. 01/2016 Lexiscan MV: mid antsept/apical defect w/ ? peri-infarct ischemia-->likely attenuation-->Med Rx.  . Chronic combined systolic (congestive) and diastolic (congestive) heart failure ([place])    a. 2012 EF 30-35%; b. 04/2015 EF 40-45%; c. 01/2016 Echo: Ef 55-65%, nl AoV bioprosthesis, nl RV, nl PASP.  Marland Kitchen Depression   . Gallbladder sludge   . GERD (gastroesophageal reflux disease)   . History of Bicuspid Aortic Valve    a. 08/2010 s/p AVR @ time of Ao dissection repair; b. 01/2016 Echo: Ef 55-65%, nl AoV bioprosthesis, nl RV, nl PASP.  Marland Kitchen Hx of repair of dissecting thoracic aortic aneurysm, Stanford type A    a. 08/2010 s/p repair with AVR and VG->RCA; b. 01/2016 CTA: stable appearance of Asc Thoracic Aortic graft. Opacification of flase lumen of chronic abd Ao dissection w/ retrograde flow through lumbar arteries. No aneurysmal dil of flase lumen.  . Hyperlipidemia   . Hypertensive heart disease   . Marfan syndrome   .  Migraine   . Obstructive sleep apnea   . Osteoporosis   . RA (rheumatoid arthritis) ([place])    a. Followed by Dr. [name]   Past Surgical History:  Procedure Laterality Date  . ABDOMINAL AORTIC ANEURYSM REPAIR    . CARDIAC CATHETERIZATION  2001  . [place]  . CHOLECYSTECTOMY    . FRACTURE SURGERY     shoulder replacement  . ORIF DISTAL RADIUS FRACTURE     Social History  Substance Use Topics  . Smoking status: Never Smoker  . Smokeless tobacco: Never Used  . Alcohol use 0.0 oz/week     Comment: occassional   Family History  Problem Relation Age of Onset  . Hypertension Mother   . Depression Mother   . Osteoporosis Mother     . Stroke Mother   . Irritable bowel syndrome Mother   . Arthritis Sister   . Diabetes Sister   . Aneurysm Paternal Grandmother   . Diabetes Paternal Grandmother   . Arthritis Paternal Grandmother   . Nephrolithiasis Sister   . Osteoporosis Sister   . Arthritis Sister   . Stroke Maternal Grandmother   . Colon cancer Maternal Grandmother   . Lymphoma Maternal Grandmother   . Arthritis Paternal Grandfather    Allergies  Allergen Reactions  . Alendronate Sodium Other (See Comments)    Reaction:  GI upset   . Hydrocod Polst-Cpm Polst Er Itching  . Hydrocodone-Acetaminophen Itching  . Oxycodone Itching  . Peanut-Containing Drug Products Other (See Comments)    Reaction:  Migraines   . Prednisone Rash   Current Outpatient Prescriptions on File Prior to Visit  Medication Sig Dispense Refill  . albuterol (PROVENTIL HFA;VENTOLIN HFA) 108 (90 Base) MCG/ACT inhaler Inhale 2 puffs into the lungs every 4 (four) hours as needed for wheezing or shortness of breath.    . ALPRAZolam (XANAX) 0.5 MG tablet Take 1 tablet (0.5 mg total) by mouth daily as needed for anxiety. 30 tablet 0  . aspirin EC 81 MG tablet Take 81 mg by mouth at bedtime.    Marland Kitchen atorvastatin (LIPITOR) 20 MG tablet Take 20 mg by mouth at bedtime. Reported on 07/05/2016    . benzonatate (TESSALON) 100 MG capsule Take 100 mg by mouth 3 (three) times daily as needed for cough.    . carvedilol (COREG) 6.25 MG tablet Take 1 tablet (6.25 mg total) by mouth 2 (two) times daily with a meal. 180 tablet 3  . cholecalciferol (VITAMIN D) 1000 units tablet Take 2,000 Units by mouth daily.    . furosemide (LASIX) 20 MG tablet Take 20 mg by mouth daily.    Marland Kitchen lisinopril (PRINIVIL,ZESTRIL) 5 MG tablet TAKE 1/2 TABLET (2.5 MG TOTAL) BY MOUTH DAILY. 45 tablet 3  . magnesium gluconate (MAGONATE) 500 MG tablet Take 500 mg by mouth at bedtime.    . Melatonin 3 MG CAPS Take 1 capsule by mouth at bedtime as needed.    . Misc Natural Products (TART  CHERRY ADVANCED) CAPS Take 1 capsule by mouth daily.    . naproxen sodium (ANAPROX) 220 MG tablet Take 440 mg by mouth 2 (two) times daily as needed (for headaches/pain).    . nitroGLYCERIN (NITROSTAT) 0.4 MG SL tablet Place 1 tablet (0.4 mg total) under the tongue every 5 (five) minutes as needed for chest pain. 25 tablet 3  . omeprazole (PRILOSEC) 40 MG capsule Take 1 capsule by mouth daily.    Marland Kitchen oxyCODONE-acetaminophen (PERCOCET/ROXICET) 5-325 MG  tablet Take 2 tablets by mouth every 8 (eight) hours as needed for severe pain.    . potassium chloride (K-DUR) 10 MEQ tablet Take 1 tablet (10 mEq total) by mouth 2 (two) times daily as needed. 180 tablet 3  . venlafaxine XR (EFFEXOR-XR) 75 MG 24 hr capsule Take 75 mg by mouth daily with breakfast.     No current facility-administered medications on file prior to visit.      Review of Systems Review of Systems  Constitutional: Negative for fever, appetite change, and unexpected weight change.  Eyes: Negative for pain and pos for vis disturbance occ with HA Respiratory: Negative for cough and shortness of breath.   Cardiovascular: Negative for cp or palpitations    Gastrointestinal: Negative for nausea, diarrhea and constipation.  Genitourinary: Negative for urgency and frequency.  Skin: Negative for pallor or rash   MSK pos for aches and pains  Neurological: Negative for weakness, light-headedness, numbness and pos for headaches.  Hematological: Negative for adenopathy. Does not bruise/bleed easily.  Psychiatric/Behavioral: Negative for dysphoric mood. The patient is not nervous/anxious.         Objective:   Physical Exam  Constitutional: She is oriented to person, place, and time. She appears well-developed and well-nourished. No distress.  obese and well appearing   HENT:  Head: Normocephalic and atraumatic.  Right Ear: External ear normal.  Left Ear: External ear normal.  Nose: Nose normal.  Mouth/Throat: Oropharynx is clear and  moist. No oropharyngeal exudate.  No sinus tenderness No temporal tenderness  No TMJ tenderness No occiput or scalp tenderness or rash   Eyes: Conjunctivae and EOM are normal. Pupils are equal, round, and reactive to light. Right eye exhibits no discharge. Left eye exhibits no discharge. No scleral icterus.  No nystagmus  Neck: Normal range of motion and full passive range of motion without pain. Neck supple. No JVD present. Carotid bruit is not present. No tracheal deviation present. No thyromegaly present.  Cardiovascular: Normal rate, regular rhythm and normal heart sounds.   No murmur heard. Pulmonary/Chest: Effort normal and breath sounds normal. No respiratory distress. She has no wheezes. She has no rales.  Abdominal: Soft. Bowel sounds are normal. She exhibits no distension and no mass. There is no tenderness.  Musculoskeletal: She exhibits no edema or tenderness.  Lymphadenopathy:    She has no cervical adenopathy.  Neurological: She is alert and oriented to person, place, and time. She has normal strength and normal reflexes. She displays no atrophy and no tremor. No cranial nerve deficit or sensory deficit. She exhibits normal muscle tone. She displays a negative Romberg sign. Coordination and gait normal.  No focal cerebellar signs   Skin: Skin is warm and dry. No rash noted. No pallor.  Psychiatric: She has a normal mood and affect. Her behavior is normal. Thought content normal.          Assessment & Plan:   Problem List Items Addressed This Visit      Cardiovascular and Mediastinum   Atypical migraine    Long disc re: symptoms that are different from her pre menopausal migraines (now L sided sharp and then dull) Has been taking a narcotic that may cause rebound along with tylenol  Long disc re: lifestye change for HA- see AVS and also handout given  Will inc water/ work on sleep/diet/exercise/avoid caffeine  Trial of flexeril for migraine prn along with phenergan  (warning of sedation)  Will update if no imp  Reassuring exam  Rev prev head MRI/MRA with pt as well  >25 minutes spent in face to face time with patient, >50% spent in counselling or coordination of care       Relevant Medications   cyclobenzaprine (FLEXERIL) 10 MG tablet    Other Visit Diagnoses   None.

## 2016-08-03 NOTE — Progress Notes (Signed)
Pre visit review using our clinic review tool, if applicable. No additional management support is needed unless otherwise documented below in the visit note. 

## 2016-08-05 NOTE — Assessment & Plan Note (Signed)
Long disc re: symptoms that are different from her pre menopausal migraines (now L sided sharp and then dull) Has been taking a narcotic that may cause rebound along with tylenol  Long disc re: lifestye change for HA- see AVS and also handout given  Will inc water/ work on sleep/diet/exercise/avoid caffeine  Trial of flexeril for migraine prn along with phenergan (warning of sedation)  Will update if no imp  Reassuring exam Rev prev head MRI/MRA with pt as well  >25 minutes spent in face to face time with patient, >50% spent in counselling or coordination of care

## 2016-08-07 ENCOUNTER — Emergency Department: Payer: Medicare HMO

## 2016-08-07 ENCOUNTER — Emergency Department
Admission: EM | Admit: 2016-08-07 | Discharge: 2016-08-08 | Disposition: A | Discharge status: Home / Self Care | Payer: Medicare HMO | Attending: Emergency Medicine | Admitting: Emergency Medicine

## 2016-08-07 ENCOUNTER — Encounter: Payer: Self-pay | Admitting: Radiology

## 2016-08-07 DIAGNOSIS — J45909 Unspecified asthma, uncomplicated: Secondary | ICD-10-CM | POA: Diagnosis not present

## 2016-08-07 DIAGNOSIS — R51 Headache: Secondary | ICD-10-CM | POA: Diagnosis not present

## 2016-08-07 DIAGNOSIS — I5042 Chronic combined systolic (congestive) and diastolic (congestive) heart failure: Secondary | ICD-10-CM | POA: Insufficient documentation

## 2016-08-07 DIAGNOSIS — I2510 Atherosclerotic heart disease of native coronary artery without angina pectoris: Secondary | ICD-10-CM | POA: Diagnosis not present

## 2016-08-07 DIAGNOSIS — I110 Hypertensive heart disease with heart failure: Secondary | ICD-10-CM | POA: Diagnosis not present

## 2016-08-07 DIAGNOSIS — R079 Chest pain, unspecified: Secondary | ICD-10-CM | POA: Insufficient documentation

## 2016-08-07 DIAGNOSIS — R002 Palpitations: Secondary | ICD-10-CM

## 2016-08-07 DIAGNOSIS — Z7982 Long term (current) use of aspirin: Secondary | ICD-10-CM | POA: Insufficient documentation

## 2016-08-07 DIAGNOSIS — I499 Cardiac arrhythmia, unspecified: Secondary | ICD-10-CM | POA: Diagnosis not present

## 2016-08-07 DIAGNOSIS — R000 Tachycardia, unspecified: Secondary | ICD-10-CM | POA: Diagnosis present

## 2016-08-07 LAB — BASIC METABOLIC PANEL
Anion gap: 9 (ref 5–15)
BUN: 27 mg/dL — ABNORMAL HIGH (ref 6–20)
CO2: 21 mmol/L — ABNORMAL LOW (ref 22–32)
Calcium: 9 mg/dL (ref 8.9–10.3)
Chloride: 107 mmol/L (ref 101–111)
Creatinine, Ser: 1.15 mg/dL — ABNORMAL HIGH (ref 0.44–1.00)
GFR calc Af Amer: 60 mL/min — ABNORMAL LOW (ref >60)
GFR calc non Af Amer: 51 mL/min — ABNORMAL LOW (ref >60)
Glucose, Bld: 84 mg/dL (ref 65–99)
Potassium: 3.8 mmol/L (ref 3.5–5.1)
Sodium: 137 mmol/L (ref 135–145)

## 2016-08-07 LAB — CBC
HCT: 38.8 % (ref 35.0–47.0)
Hemoglobin: 13.7 g/dL (ref 12.0–16.0)
MCH: 33 pg (ref 26.0–34.0)
MCHC: 35.4 g/dL (ref 32.0–36.0)
MCV: 93.3 fL (ref 80.0–100.0)
Platelets: 228 10*3/uL (ref 150–440)
RBC: 4.15 MIL/uL (ref 3.80–5.20)
RDW: 14 % (ref 11.5–14.5)
WBC: 7.6 10*3/uL (ref 3.6–11.0)

## 2016-08-07 LAB — TROPONIN I: Troponin I: <0.03 ng/mL (ref <0.03)

## 2016-08-07 MED ORDER — IOPAMIDOL (ISOVUE-370) INJECTION 76%
75.0000 mL | Freq: Once | INTRAVENOUS | Status: AC | PRN
  Administered 2016-08-07: 75 mL via INTRAVENOUS

## 2016-08-07 NOTE — ED Notes (Signed)
Patient transported to CT 

## 2016-08-07 NOTE — ED Notes (Signed)
Blood specimens were sent to the lab. EKG was signed by Dr. Archie Balboa

## 2016-08-07 NOTE — ED Notes (Signed)
Pt complains of left arm heaviness with onset of of tachycardia this pm. Pt states tachycardia lasted approx 45 minutes. Pt states has had a headache relating as a migraine for last 3 days. Pt denies known fever. Pt denies photophobia. Pt with strong equal upper extremity grips. Skin pwd. Pt denies chest pain with tachycardia but states had mid upper back pain with tachycardia. Pt relates did feel shob with onset of tachycardia.

## 2016-08-07 NOTE — ED Provider Notes (Signed)
[facility] Emergency Department Provider Note   ____________________________________________    I have reviewed the triage vital signs and the nursing notes.   HISTORY  Chief Complaint Tachycardia     HPI Rachel Torres is a 60 y.o. female who presents with complaints of palpitations. Patient reports that approximately 6:30 PM she developed palpitations. She then checked her heart rate and found it to be 160, at that point she became very anxious and reports left arm heaviness. This has improved but her left arm still does not feel quite right. She reports her heart rate has improved. She denies chest pain. No shortness of breath. She reports a history of a history of aortic dissection repair as well as an aortic valve but she is not on blood thinners. No nausea or vomiting or diaphoresis. She also reports a history of SVT in the distant past for which she received ablation. She reports this felt similar.In addition she notes that she has had a migraine over the last 3 days. She does report a history of migraines in the past but this is a little unusual for her to have it lasts a long period  Past Medical History:  Diagnosis Date  . AAA (abdominal aortic aneurysm) ([place])    a. Chronic w/o evidence of aneurysmal dil on CTA 01/2016.  Marland Kitchen Allergy   . Anxiety   . Asthma   . CAD (coronary artery disease)    a. 08/2010 s/p CABG x 1 (VG->RCA) @ time of Ao dissection repair; b. 01/2016 Lexiscan MV: mid antsept/apical defect w/ ? peri-infarct ischemia-->likely attenuation-->Med Rx.  . Chronic combined systolic (congestive) and diastolic (congestive) heart failure ([place])    a. 2012 EF 30-35%; b. 04/2015 EF 40-45%; c. 01/2016 Echo: Ef 55-65%, nl AoV bioprosthesis, nl RV, nl PASP.  Marland Kitchen Depression   . Gallbladder sludge   . GERD (gastroesophageal reflux disease)   . History of Bicuspid Aortic Valve    a. 08/2010 s/p AVR @ time of Ao dissection repair; b. 01/2016 Echo: Ef  55-65%, nl AoV bioprosthesis, nl RV, nl PASP.  Marland Kitchen Hx of repair of dissecting thoracic aortic aneurysm, Stanford type A    a. 08/2010 s/p repair with AVR and VG->RCA; b. 01/2016 CTA: stable appearance of Asc Thoracic Aortic graft. Opacification of flase lumen of chronic abd Ao dissection w/ retrograde flow through lumbar arteries. No aneurysmal dil of flase lumen.  . Hyperlipidemia   . Hypertensive heart disease   . Marfan syndrome   . Migraine   . Obstructive sleep apnea   . Osteoporosis   . RA (rheumatoid arthritis) ([place])    a. Followed by Dr. [name]    Patient Active Problem List   Diagnosis Date Noted  . Atypical migraine 08/03/2016  . Sweating 07/12/2016  . CAD (coronary artery disease)   . AAA (abdominal aortic aneurysm) ([place])   . Chronic combined systolic (congestive) and diastolic (congestive) heart failure ([place])   . Hyperglycemia 02/11/2016  . Aortic dissection ([place]) 05/12/2012  . S/P AVR (aortic valve replacement) 01/07/2011  . CORONARY ARTERY BYPASS GRAFT, HX OF 01/07/2011  . IRRITABLE BOWEL SYNDROME 09/09/2009  . Obstructive sleep apnea 08/04/2009  . External hemorrhoid 06/26/2009  . Osteoporosis 01/09/2009  . Hyperlipidemia 07/26/2007  . Depression with anxiety 07/26/2007  . Essential hypertension 07/26/2007  . Allergic rhinitis 07/26/2007  . Asthma 07/26/2007  . GERD 07/26/2007  . Osteoarthritis 07/26/2007  . Ocular migraine 07/26/2007    Past Surgical History:  Procedure Laterality Date  . ABDOMINAL AORTIC ANEURYSM REPAIR    . CARDIAC CATHETERIZATION  2001  . [place]  . CHOLECYSTECTOMY    . FRACTURE SURGERY     shoulder replacement  . ORIF DISTAL RADIUS FRACTURE      Prior to Admission medications   Medication Sig Start Date End Date Taking? Authorizing Provider  albuterol (PROVENTIL HFA;VENTOLIN HFA) 108 (90 Base) MCG/ACT inhaler Inhale 2 puffs into the lungs every 4 (four) hours as needed for wheezing or shortness of breath.     Historical Provider, MD  ALPRAZolam Duanne Moron) 0.5 MG tablet Take 1 tablet (0.5 mg total) by mouth daily as needed for anxiety. 07/15/16   [name], MD  aspirin EC 81 MG tablet Take 81 mg by mouth at bedtime.    Historical Provider, MD  atorvastatin (LIPITOR) 20 MG tablet Take 20 mg by mouth at bedtime. Reported on 07/05/2016    Historical Provider, MD  benzonatate (TESSALON) 100 MG capsule Take 100 mg by mouth 3 (three) times daily as needed for cough.    Historical Provider, MD  carvedilol (COREG) 6.25 MG tablet Take 1 tablet (6.25 mg total) by mouth 2 (two) times daily with a meal. 03/18/16   Minna Merritts, MD  cholecalciferol (VITAMIN D) 1000 units tablet Take 2,000 Units by mouth daily.    Historical Provider, MD  cyclobenzaprine (FLEXERIL) 10 MG tablet Take 0.5-1 tablets (5-10 mg total) by mouth 3 (three) times daily as needed. For headache 08/03/16   [name], MD  furosemide (LASIX) 20 MG tablet Take 20 mg by mouth daily. 04/20/13 07/14/17  Minna Merritts, MD  lisinopril (PRINIVIL,ZESTRIL) 5 MG tablet TAKE 1/2 TABLET (2.5 MG TOTAL) BY MOUTH DAILY. 07/14/16   Minna Merritts, MD  magnesium gluconate (MAGONATE) 500 MG tablet Take 500 mg by mouth at bedtime.    Historical Provider, MD  Melatonin 3 MG CAPS Take 1 capsule by mouth at bedtime as needed.    Historical Provider, MD  Misc Natural Products (TART CHERRY ADVANCED) CAPS Take 1 capsule by mouth daily.    Historical Provider, MD  naproxen sodium (ANAPROX) 220 MG tablet Take 440 mg by mouth 2 (two) times daily as needed (for headaches/pain).    Historical Provider, MD  nitroGLYCERIN (NITROSTAT) 0.4 MG SL tablet Place 1 tablet (0.4 mg total) under the tongue every 5 (five) minutes as needed for chest pain. 10/24/15   Minna Merritts, MD  omeprazole (PRILOSEC) 40 MG capsule Take 1 capsule by mouth daily. 06/28/16   Historical Provider, MD  oxyCODONE-acetaminophen (PERCOCET/ROXICET) 5-325 MG tablet Take 2 tablets by mouth every 8 (eight)  hours as needed for severe pain.    Historical Provider, MD  potassium chloride (K-DUR) 10 MEQ tablet Take 1 tablet (10 mEq total) by mouth 2 (two) times daily as needed. 10/24/15   Minna Merritts, MD  promethazine (PHENERGAN) 25 MG tablet Take 0.5 tablets (12.5 mg total) by mouth every 8 (eight) hours as needed for nausea. 08/03/16   [name], MD  venlafaxine XR (EFFEXOR-XR) 75 MG 24 hr capsule Take 75 mg by mouth daily with breakfast.    Historical Provider, MD     Allergies Alendronate sodium; Hydrocod polst-cpm polst er; Hydrocodone-acetaminophen; Oxycodone; Peanut-containing drug products; and Prednisone  Family History  Problem Relation Age of Onset  . Hypertension Mother   . Depression Mother   . Osteoporosis Mother   . Stroke Mother   .  Irritable bowel syndrome Mother   . Arthritis Sister   . Diabetes Sister   . Aneurysm Paternal Grandmother   . Diabetes Paternal Grandmother   . Arthritis Paternal Grandmother   . Nephrolithiasis Sister   . Osteoporosis Sister   . Arthritis Sister   . Stroke Maternal Grandmother   . Colon cancer Maternal Grandmother   . Lymphoma Maternal Grandmother   . Arthritis Paternal Grandfather     Social History Social History  Substance Use Topics  . Smoking status: Never Smoker  . Smokeless tobacco: Never Used  . Alcohol use 0.0 oz/week     Comment: occassional    Review of Systems  Constitutional: No fever/chills Eyes: No visual changes.  ENT: No sore throat. Cardiovascular: Denies chest pain.Palpitations as above, now resolved Respiratory: Denies shortness of breath. Gastrointestinal: No abdominal pain.  No nausea, no vomiting.   Genitourinary: Negative for dysuria. Musculoskeletal: Negative for back pain. Skin: Negative for rash. Neurological: Migraine as above  10-point ROS otherwise negative.  ____________________________________________   PHYSICAL EXAM:  VITAL SIGNS: ED Triage Vitals  Enc Vitals Group     BP  08/07/16 2052 118/71     Pulse Rate 08/07/16 2052 95     Resp 08/07/16 2052 18     Temp 08/07/16 2052 97.5 F (36.4 C)     Temp Source 08/07/16 2052 Oral     SpO2 08/07/16 2052 95 %     Weight 08/07/16 2053 182 lb (82.6 kg)     Height 08/07/16 2053 5\' 5"  (1.651 m)     Head Circumference --      Peak Flow --      Pain Score 08/07/16 2055 5     Pain Loc --      Pain Edu? --      Excl. in [place]? --     Constitutional: Alert and oriented. No acute distress. Pleasant and interactive Eyes: Conjunctivae are normal. PERRLA, EOMI Head: Atraumatic. Nose: No congestion/rhinnorhea. Mouth/Throat: Mucous membranes are moist.   Neck:  Painless ROM Cardiovascular: Normal rate, regular rhythm. Grossly normal heart sounds.  Good peripheral circulation. Respiratory: Normal respiratory effort.  No retractions. Lungs CTAB. Gastrointestinal: Soft and nontender. No distention.  No CVA tenderness. Genitourinary: deferred Musculoskeletal: No lower extremity tenderness nor edema.  Warm and well perfused Neurologic:  Normal speech and language. No gross focal neurologic deficits are appreciated. Normal strength in all extremities Skin:  Skin is warm, dry and intact. No rash noted. Psychiatric: Mood and affect are normal. Speech and behavior are normal. Mild anxiety  ____________________________________________   LABS (all labs ordered are listed, but only abnormal results are displayed)  Labs Reviewed  BASIC METABOLIC PANEL - Abnormal; Notable for the following:       Result Value   CO2 21 (*)    BUN 27 (*)    Creatinine, Ser 1.15 (*)    GFR calc non Af Amer 51 (*)    GFR calc Af Amer 60 (*)    All other components within normal limits  CBC  TROPONIN I  TROPONIN I   ____________________________________________  EKG  ED ECG REPORT I, Lavonia Drafts, the attending physician, personally viewed and interpreted this ECG.  Date: 08/07/2016 EKG Time: 8:37 PM Rate: 95 Rhythm: normal sinus  rhythm QRS Axis: normal Intervals: normal ST/T Wave abnormalities: normal Conduction Disturbances: none   ____________________________________________  RADIOLOGY  Pending CT angiography of the chest and CT brain ____________________________________________   PROCEDURES  Procedure(s) performed: No  Critical Care performed: No ____________________________________________   INITIAL IMPRESSION / ASSESSMENT AND PLAN / ED COURSE  Pertinent labs & imaging results that were available during my care of the patient were reviewed by me and considered in my medical decision making (see chart for details).  Complicated case. Patient with significant past medical history. I'm concerned about her description of left arm "heaviness". This seems to have resolved and neurologically she is completely intact on exam. Overall she is well-appearing and in no distress. Her EKG is reassuring. However given her history we will obtain CT angiography of her chest, and because she has been having this migraine and now with this episode of left arm "heaviness" we will also image her brain.  Clinical Course  Patient is quite comfortable  I will have to ask my colleague to follow up on the imaging studies and dispose appropriately  FINAL CLINICAL IMPRESSION(S) / ED DIAGNOSES  Palpitations   NEW MEDICATIONS STARTED DURING THIS VISIT:  New Prescriptions   No medications on file     Note:  This document was prepared using Dragon voice recognition software and may include unintentional dictation errors.    Lavonia Drafts, MD 08/07/16 2239

## 2016-08-07 NOTE — ED Triage Notes (Signed)
Pt to triage via wheelchair. Pt reports around 630 pm she felt like her heart was racing. Pt reports she took her BP and was 106/57 and her pulse was around 160. Pt reports hx of ablation over 18 years ago and AAA dissection 6 years ago. Pt reports she has had a migraine for several days also. Pt reports she took flexeril around 715 pm and seem to have relaxed her and that her heart rate seemed to go down some then. Pt now has heart rate of 95 and NSR on 12 lead.

## 2016-08-08 DIAGNOSIS — R002 Palpitations: Secondary | ICD-10-CM | POA: Diagnosis not present

## 2016-08-08 LAB — TROPONIN I: Troponin I: <0.03 ng/mL (ref <0.03)

## 2016-08-08 NOTE — ED Notes (Signed)
Pt updated on repeat troponin results. Pt provided with po fluids with md consent.

## 2016-08-08 NOTE — ED Provider Notes (Signed)
-----------------------------------------   12:54 AM on 08/08/2016 -----------------------------------------   Blood pressure 123/61, pulse 84, temperature 97.5 F (36.4 C), temperature source Oral, resp. rate 15, height 5\' 5"  (1.651 m), weight 182 lb (82.6 kg), SpO2 90 %.  Assuming care from Dr. Corky Downs.  In short, Rachel Torres is a 60 y.o. female with a chief complaint of Tachycardia .  Refer to the original H&P for additional details.  The current plan of care is to follow-up the repeat troponin.   The patient's troponin is negative and her palpitations are resolved. She'll be discharged home to follow-up with her primary care physician.   Loney Hering, MD 08/08/16 [phone]

## 2016-08-08 NOTE — ED Notes (Signed)
Second troponin obtained. Pt updated on wait time for blood work results. Pt denies further needs. Call bell at ems.

## 2016-08-10 ENCOUNTER — Telehealth: Payer: Self-pay | Admitting: Family Medicine

## 2016-08-10 NOTE — Telephone Encounter (Signed)
Pt aware of appointment 

## 2016-08-10 NOTE — Telephone Encounter (Signed)
Patient was seen at [facility] ER on 08/07/16 for a elevated heart rate. It was 160-180 for an hour.  Patient was told to follow up with Dr.Tower as soon as possible.  Patient forgot to call yesterday.  The first 4:15 available is on Friday.  Please advise where to schedule patient.

## 2016-08-10 NOTE — Telephone Encounter (Signed)
Please put her into the 4:15 on friday

## 2016-08-13 ENCOUNTER — Encounter: Payer: Self-pay | Admitting: Family Medicine

## 2016-08-13 ENCOUNTER — Ambulatory Visit (INDEPENDENT_AMBULATORY_CARE_PROVIDER_SITE_OTHER): Payer: Medicare HMO | Admitting: Family Medicine

## 2016-08-13 VITALS — BP 122/78 | HR 73 | Temp 98.3°F | Ht 65.0 in | Wt 187.2 lb

## 2016-08-13 DIAGNOSIS — R000 Tachycardia, unspecified: Secondary | ICD-10-CM

## 2016-08-13 DIAGNOSIS — Z23 Encounter for immunization: Secondary | ICD-10-CM | POA: Diagnosis not present

## 2016-08-13 DIAGNOSIS — F418 Other specified anxiety disorders: Secondary | ICD-10-CM

## 2016-08-13 DIAGNOSIS — G43809 Other migraine, not intractable, without status migrainosus: Secondary | ICD-10-CM

## 2016-08-13 DIAGNOSIS — I10 Essential (primary) hypertension: Secondary | ICD-10-CM

## 2016-08-13 DIAGNOSIS — G43009 Migraine without aura, not intractable, without status migrainosus: Secondary | ICD-10-CM

## 2016-08-13 DIAGNOSIS — K649 Unspecified hemorrhoids: Secondary | ICD-10-CM

## 2016-08-13 NOTE — Patient Instructions (Signed)
ED records reviewed and reassuring  Keep [place] posted - regarding symptoms  Take care of yourself  Make sure to stay hydrated and avoid caffeine Flu shot today  Stop at check out for referrals to counseling and GI

## 2016-08-13 NOTE — Progress Notes (Signed)
Pre visit review using our clinic review tool, if applicable. No additional management support is needed unless otherwise documented below in the visit note. 

## 2016-08-13 NOTE — Progress Notes (Signed)
Subjective:    Patient ID: Rachel Torres, female    DOB: Jul 25, 1956, 60 y.o.   MRN: [id number]  HPI Here for f/u of ED visit  She was at home at 6:30 pm on 8/19 and developed palpitations- found her HR to be around 160 and felt very anxious and L arm felt "heavy" Denied cp or sob  Noted hx of aortic dissection/ repair and AV, also remote hx of SVT in the past (18 years ago) for which she rec an ablation  Of note-also had a migraine for several days before that  Went to the ED Overall symptoms lasted about 45 min - was better by the time she got to the hospital  Troponin was negative and palpitations resolved in the hospital  Pulse was down to 95 bpm when she arrived with bp os 118/71 and nl pulse ox on RA EKG NSR with no acute changes  Nl exam  ? What the cause was  No caffeine or albuterol She is also packing herself to move - asap when they can close on an estate  She was very stressed out  Husband with parkinson's Had to watch her daughter's dogs for 4 days -it really upset her  Sometimes her daughter puts her into a panic attack (she needs to go back to her counselor) Desires a referral to counseling here   She takes xanax and effexor   Also wants ref to GI for  Hemorrhoids    Dg Chest 2 View  Result Date: 08/07/2016 CLINICAL DATA:  Chest pain. EXAM: CHEST  2 VIEW COMPARISON:  February 09, 2016 FINDINGS: The heart size and mediastinal contours are within normal limits. Both lungs are clear. The visualized skeletal structures are unremarkable. IMPRESSION: No active cardiopulmonary disease. Electronically Signed   By: Dorise Bullion III M.D   On: 08/07/2016 21:29   Ct Head Wo Contrast  Result Date: 08/07/2016 CLINICAL DATA:  Acute onset of heart palpitations and left-sided weakness. Migraine headache. Initial encounter. EXAM: CT HEAD WITHOUT CONTRAST TECHNIQUE: Contiguous axial images were obtained from the base of the skull through the vertex without intravenous  contrast. COMPARISON:  None. FINDINGS: Brain: No evidence of acute infarction, hemorrhage, hydrocephalus, extra-axial collection or mass lesion/mass effect. The posterior fossa, including the cerebellum, brainstem and fourth ventricle, is within normal limits. The third and lateral ventricles, and basal ganglia are unremarkable in appearance. The cerebral hemispheres are symmetric in appearance, with normal gray-white differentiation. No mass effect or midline shift is seen. Vascular: No hyperdense vessel or unexpected calcification. Skull: There is no evidence of fracture; visualized osseous structures are unremarkable in appearance. Sinuses/Orbits: The visualized portions of the orbits are within normal limits. The paranasal sinuses and mastoid air cells are well-aerated. Other: No significant soft tissue abnormalities are seen. IMPRESSION: Unremarkable noncontrast CT of the head. Electronically Signed   By: Garald Balding M.D.   On: 08/07/2016 23:10   Ct Angio Chest Aorta W And/or Wo Contrast  Result Date: 08/07/2016 CLINICAL DATA:  Arrhythmia with left-sided weakness. Previous bypass surgery and previous aortic dissection EXAM: CT ANGIOGRAPHY CHEST WITH CONTRAST TECHNIQUE: Multidetector CT imaging of the chest was performed using the standard protocol during bolus administration of intravenous contrast. Multiplanar CT image reconstructions and MIPs were obtained to evaluate the vascular anatomy. CONTRAST:  75 cc Isovue 370 COMPARISON:  02/09/2016.  07/05/2015. FINDINGS: Mediastinum/Lymph Nodes: No mass or lymphadenopathy. No pulmonary emboli identified, though contrast is I optimized for the systemic circulation. Atherosclerosis of  the aorta and of the aortic valve. Atherosclerosis of the coronary arteries. Chronic wall thickening of the posterior wall of the thoracic aorta but without evidence of residual or recurrent dissection in the thoracic aorta. Chronic upper abdominal aortic dissection including some  persistent flow in the false laminae beginning below the level of the celiac artery. Good flow to the superior mesenteric and renal arteries. Maximal transverse diameter of the aorta and total at the lowest images 2.9 cm. Lungs/Pleura: Lungs are clear.  No pleural disease. Upper abdomen: No organ pathology.  Previous cholecystectomy. Musculoskeletal: Negative Review of the MIP images confirms the above findings. IMPRESSION: No acute finding by CT. Previous aortic repair without evidence of recurrent thoracic dissection. Previous CABG. Calcification of the aorta, aortic valve and coronary arteries. Chronic flow within the false limb an in the abdominal aortic region, but with good flow in the true lumen supplying the celiac, superior mesenteric and renal arteries. Abdominal aorta not completely imaged. Lungs clear Electronically Signed   By: Nelson Chimes M.D.   On: 08/07/2016 23:10    Both CT of head and chest  Were re assuring   Labs: Admission on 08/07/2016, Discharged on 08/08/2016  Component Date Value Ref Range Status  . Sodium 08/07/2016 137  135 - 145 mmol/L Final  . Potassium 08/07/2016 3.8  3.5 - 5.1 mmol/L Final  . Chloride 08/07/2016 107  101 - 111 mmol/L Final  . CO2 08/07/2016 21* 22 - 32 mmol/L Final  . Glucose, Bld 08/07/2016 84  65 - 99 mg/dL Final  . BUN 08/07/2016 27* 6 - 20 mg/dL Final  . Creatinine, Ser 08/07/2016 1.15* 0.44 - 1.00 mg/dL Final  . Calcium 08/07/2016 9.0  8.9 - 10.3 mg/dL Final  . GFR calc non Af Amer 08/07/2016 51* >60 mL/min Final  . GFR calc Af Amer 08/07/2016 60* >60 mL/min Final   Comment: (NOTE) The eGFR has been calculated using the CKD EPI equation. This calculation has not been validated in all clinical situations. eGFR's persistently <60 mL/min signify possible Chronic Kidney Disease.   . Anion gap 08/07/2016 9  5 - 15 Final  . WBC 08/07/2016 7.6  3.6 - 11.0 K/uL Final  . RBC 08/07/2016 4.15  3.80 - 5.20 MIL/uL Final  . Hemoglobin 08/07/2016 13.7   12.0 - 16.0 g/dL Final  . HCT 08/07/2016 38.8  35.0 - 47.0 % Final  . MCV 08/07/2016 93.3  80.0 - 100.0 fL Final  . MCH 08/07/2016 33.0  26.0 - 34.0 pg Final  . MCHC 08/07/2016 35.4  32.0 - 36.0 g/dL Final  . RDW 08/07/2016 14.0  11.5 - 14.5 % Final  . Platelets 08/07/2016 228  150 - 440 K/uL Final  . Troponin I 08/07/2016 <0.03  <0.03 ng/mL Final  . Troponin I 08/08/2016 <0.03  <0.03 ng/mL Final     Of note-pt takes coreg 6.25 mg bid   Patient Active Problem List   Diagnosis Date Noted  . Tachycardia 08/14/2016  . Hemorrhoids 08/13/2016  . Atypical migraine 08/03/2016  . Sweating 07/12/2016  . CAD (coronary artery disease)   . AAA (abdominal aortic aneurysm) ([place])   . Chronic combined systolic (congestive) and diastolic (congestive) heart failure ([place])   . Hyperglycemia 02/11/2016  . Aortic dissection ([place]) 05/12/2012  . S/P AVR (aortic valve replacement) 01/07/2011  . CORONARY ARTERY BYPASS GRAFT, HX OF 01/07/2011  . IRRITABLE BOWEL SYNDROME 09/09/2009  . Obstructive sleep apnea 08/04/2009  . External hemorrhoid 06/26/2009  . Osteoporosis  01/09/2009  . Hyperlipidemia 07/26/2007  . Depression with anxiety 07/26/2007  . Essential hypertension 07/26/2007  . Allergic rhinitis 07/26/2007  . Asthma 07/26/2007  . GERD 07/26/2007  . Osteoarthritis 07/26/2007  . Ocular migraine 07/26/2007   Past Medical History:  Diagnosis Date  . AAA (abdominal aortic aneurysm) ([place])    a. Chronic w/o evidence of aneurysmal dil on CTA 01/2016.  Marland Kitchen Allergy   . Anxiety   . Asthma   . CAD (coronary artery disease)    a. 08/2010 s/p CABG x 1 (VG->RCA) @ time of Ao dissection repair; b. 01/2016 Lexiscan MV: mid antsept/apical defect w/ ? peri-infarct ischemia-->likely attenuation-->Med Rx.  . Chronic combined systolic (congestive) and diastolic (congestive) heart failure ([place])    a. 2012 EF 30-35%; b. 04/2015 EF 40-45%; c. 01/2016 Echo: Ef 55-65%, nl AoV bioprosthesis, nl RV, nl PASP.  Marland Kitchen Depression     . Gallbladder sludge   . GERD (gastroesophageal reflux disease)   . History of Bicuspid Aortic Valve    a. 08/2010 s/p AVR @ time of Ao dissection repair; b. 01/2016 Echo: Ef 55-65%, nl AoV bioprosthesis, nl RV, nl PASP.  Marland Kitchen Hx of repair of dissecting thoracic aortic aneurysm, Stanford type A    a. 08/2010 s/p repair with AVR and VG->RCA; b. 01/2016 CTA: stable appearance of Asc Thoracic Aortic graft. Opacification of flase lumen of chronic abd Ao dissection w/ retrograde flow through lumbar arteries. No aneurysmal dil of flase lumen.  . Hyperlipidemia   . Hypertensive heart disease   . Marfan syndrome   . Migraine   . Obstructive sleep apnea   . Osteoporosis   . RA (rheumatoid arthritis) ([place])    a. Followed by Dr. [name]   Past Surgical History:  Procedure Laterality Date  . ABDOMINAL AORTIC ANEURYSM REPAIR    . CARDIAC CATHETERIZATION  2001  . [place]  . CHOLECYSTECTOMY    . FRACTURE SURGERY     shoulder replacement  . ORIF DISTAL RADIUS FRACTURE     Social History  Substance Use Topics  . Smoking status: Never Smoker  . Smokeless tobacco: Never Used  . Alcohol use 0.0 oz/week     Comment: occassional   Family History  Problem Relation Age of Onset  . Hypertension Mother   . Depression Mother   . Osteoporosis Mother   . Stroke Mother   . Irritable bowel syndrome Mother   . Arthritis Sister   . Diabetes Sister   . Aneurysm Paternal Grandmother   . Diabetes Paternal Grandmother   . Arthritis Paternal Grandmother   . Nephrolithiasis Sister   . Osteoporosis Sister   . Arthritis Sister   . Stroke Maternal Grandmother   . Colon cancer Maternal Grandmother   . Lymphoma Maternal Grandmother   . Arthritis Paternal Grandfather    Allergies  Allergen Reactions  . Alendronate Sodium Other (See Comments)    Reaction:  GI upset   . Hydrocod Polst-Cpm Polst Er Itching  . Hydrocodone-Acetaminophen Itching  . Oxycodone Itching  . Peanut-Containing Drug  Products Other (See Comments)    Reaction:  Migraines   . Prednisone Rash   Current Outpatient Prescriptions on File Prior to Visit  Medication Sig Dispense Refill  . albuterol (PROVENTIL HFA;VENTOLIN HFA) 108 (90 Base) MCG/ACT inhaler Inhale 2 puffs into the lungs every 4 (four) hours as needed for wheezing or shortness of breath.    . ALPRAZolam (XANAX) 0.5 MG tablet Take 1 tablet (0.5  mg total) by mouth daily as needed for anxiety. 30 tablet 0  . aspirin EC 81 MG tablet Take 81 mg by mouth at bedtime.    Marland Kitchen atorvastatin (LIPITOR) 20 MG tablet Take 20 mg by mouth at bedtime. Reported on 07/05/2016    . benzonatate (TESSALON) 100 MG capsule Take 100 mg by mouth 3 (three) times daily as needed for cough.    . carvedilol (COREG) 6.25 MG tablet Take 1 tablet (6.25 mg total) by mouth 2 (two) times daily with a meal. 180 tablet 3  . cholecalciferol (VITAMIN D) 1000 units tablet Take 2,000 Units by mouth daily.    . cyclobenzaprine (FLEXERIL) 10 MG tablet Take 0.5-1 tablets (5-10 mg total) by mouth 3 (three) times daily as needed. For headache 30 tablet 1  . furosemide (LASIX) 20 MG tablet Take 20 mg by mouth daily.    Marland Kitchen lisinopril (PRINIVIL,ZESTRIL) 5 MG tablet TAKE 1/2 TABLET (2.5 MG TOTAL) BY MOUTH DAILY. 45 tablet 3  . magnesium gluconate (MAGONATE) 500 MG tablet Take 500 mg by mouth at bedtime.    . Melatonin 3 MG CAPS Take 1 capsule by mouth at bedtime as needed.    . Misc Natural Products (TART CHERRY ADVANCED) CAPS Take 1 capsule by mouth daily.    . naproxen sodium (ANAPROX) 220 MG tablet Take 440 mg by mouth 2 (two) times daily as needed (for headaches/pain).    . nitroGLYCERIN (NITROSTAT) 0.4 MG SL tablet Place 1 tablet (0.4 mg total) under the tongue every 5 (five) minutes as needed for chest pain. 25 tablet 3  . omeprazole (PRILOSEC) 40 MG capsule Take 1 capsule by mouth daily.    Marland Kitchen oxyCODONE-acetaminophen (PERCOCET/ROXICET) 5-325 MG tablet Take 2 tablets by mouth every 8 (eight) hours as  needed for severe pain.    . potassium chloride (K-DUR) 10 MEQ tablet Take 1 tablet (10 mEq total) by mouth 2 (two) times daily as needed. 180 tablet 3  . promethazine (PHENERGAN) 25 MG tablet Take 0.5 tablets (12.5 mg total) by mouth every 8 (eight) hours as needed for nausea. 30 tablet 1  . venlafaxine XR (EFFEXOR-XR) 75 MG 24 hr capsule Take 75 mg by mouth daily with breakfast.     No current facility-administered medications on file prior to visit.     Review of Systems Review of Systems  Constitutional: Negative for fever, appetite change,  and unexpected weight change.  Eyes: Negative for pain and visual disturbance.  Respiratory: Negative for cough and shortness of breath.   Cardiovascular: Negative for cp or palpitations    Gastrointestinal: Negative for nausea, diarrhea and constipation.  Genitourinary: Negative for urgency and frequency.  Skin: Negative for pallor or rash   Neurological: Negative for weakness, light-headedness, numbness and headaches.  Hematological: Negative for adenopathy. Does not bruise/bleed easily.  Psychiatric/Behavioral: Negative for dysphoric mood. The patient is nervous/anxious.  pos for severe stressors        Objective:   Physical Exam  Constitutional: She appears well-developed and well-nourished. No distress.  overwt and well appearing   HENT:  Head: Normocephalic and atraumatic.  Mouth/Throat: Oropharynx is clear and moist.  Eyes: Conjunctivae and EOM are normal. Pupils are equal, round, and reactive to light.  Neck: Normal range of motion. Neck supple. No JVD present. Carotid bruit is not present. No thyromegaly present.  Cardiovascular: Normal rate, regular rhythm and intact distal pulses.  Exam reveals no gallop.   Murmur heard. Pulmonary/Chest: Effort normal and breath sounds normal.  No respiratory distress. She has no wheezes. She has no rales.  No crackles  Abdominal: Soft. Bowel sounds are normal. She exhibits no distension, no  abdominal bruit and no mass. There is no tenderness.  Musculoskeletal: She exhibits no edema.  Lymphadenopathy:    She has no cervical adenopathy.  Neurological: She is alert. She has normal reflexes.  Skin: Skin is warm and dry. No rash noted. No pallor.  Psychiatric: Her mood appears anxious. Her affect is not blunt, not labile and not inappropriate. Her speech is not rapid and/or pressured. Thought content is not paranoid. She expresses no homicidal and no suicidal ideation.          Assessment & Plan:   Problem List Items Addressed This Visit      Cardiovascular and Mediastinum   Hemorrhoids    Pt req GI ref for external and bleeding internal hemorrhoids Ref donw       Relevant Orders   Ambulatory referral to Gastroenterology   Essential hypertension    bp in fair control at this time  BP Readings from Last 1 Encounters:  08/13/16 122/78   No changes needed Disc lifstyle change with low sodium diet and exercise  Labs and recent ED visit rev Cardiologist f/u upcoming       Atypical migraine    Exacerbated by stress  This may have played a role in tachycardia  Enc good hydration and sleep  Ref to counseling for help with stress reaction  bp in good control On effexor  Phenergan prn         Other   Tachycardia    Episodic ED records and studies ref with pt in detail -see note Past hx of SVT one time May have resolved with valsalva this time  Currently resolved Nl exam today  Will ref to counselor for stress Cardiology visit upcoming  Alert [place] and seek care asap it this re occurs so EKG can be done       Depression with anxiety    Ongoing  Used to see psychiatry  On effexor and prn xanax  Reviewed stressors/ coping techniques/symptoms/ support sources/ tx options and side effects in detail today Stressors are severe  Ref to counseling  Likely played a role in her episode of tachycardia       Relevant Orders   Ambulatory referral to Psychology      Other Visit Diagnoses    Need for influenza vaccination    -  Primary   Relevant Orders   Flu Vaccine QUAD 36+ mos PF IM (Fluarix & Fluzone Quad PF) (Completed)

## 2016-08-14 DIAGNOSIS — R000 Tachycardia, unspecified: Secondary | ICD-10-CM | POA: Insufficient documentation

## 2016-08-14 NOTE — Assessment & Plan Note (Signed)
Exacerbated by stress  This may have played a role in tachycardia  Enc good hydration and sleep  Ref to counseling for help with stress reaction  bp in good control On effexor  Phenergan prn

## 2016-08-14 NOTE — Assessment & Plan Note (Signed)
Ongoing  Used to see psychiatry  On effexor and prn xanax  Reviewed stressors/ coping techniques/symptoms/ support sources/ tx options and side effects in detail today Stressors are severe  Ref to counseling  Likely played a role in her episode of tachycardia

## 2016-08-14 NOTE — Assessment & Plan Note (Signed)
Pt req GI ref for external and bleeding internal hemorrhoids Ref donw

## 2016-08-14 NOTE — Assessment & Plan Note (Signed)
bp in fair control at this time  BP Readings from Last 1 Encounters:  08/13/16 122/78   No changes needed Disc lifstyle change with low sodium diet and exercise  Labs and recent ED visit rev Cardiologist f/u upcoming

## 2016-08-14 NOTE — Assessment & Plan Note (Signed)
Episodic ED records and studies ref with pt in detail -see note Past hx of SVT one time May have resolved with valsalva this time  Currently resolved Nl exam today  Will ref to counselor for stress Cardiology visit upcoming  Alert [place] and seek care asap it this re occurs so EKG can be done

## 2016-08-19 ENCOUNTER — Encounter: Payer: Medicare HMO | Admitting: Internal Medicine

## 2016-08-30 ENCOUNTER — Other Ambulatory Visit: Payer: Self-pay | Admitting: Cardiovascular Disease

## 2016-08-31 NOTE — Progress Notes (Deleted)
Cardiology Office Note Date:  08/31/2016  Patient ID:  Rachel Torres, DOB Apr 13, 1956, MRN [id number] PCP:  Loura Pardon, MD  Cardiologist:  Dr. [name], MD  ***refresh   Chief Complaint: ED follow up  History of Present Illness: Rachel Torres is a 60 y.o. female with history of CAD s/p 1 vessel CABG (VG-RCA) in 08/2010 coupled with ascending aortic dissection repair and bioprosthetic AVR at the same time. She also has a history of AAA, marfan syndrome, asthma, hypertensive heart disease, chronic combined CHF with subsequent normalization of her EF, PSA, migraine disorder, and anxiety who presents for ED follow up for palpitations.   Prior echo from 04/2015 showed an EF of 40-45% which was improved from prior. Most recent echo from 01/2016 showed normalization of her EF to [id number], LV diastolic function was normal, septal motion c/w post-operative state, normal functioning bioprosthetic arotic valve without stenosis, PASP normal.   Recently admitted to [facility] in 01/2016 for chest pain. She ruled out and underwent Lexiscan Myoview that was low-risk with probable attenuation artifact. In hospital follow up in March she continued to note intermittent chest pain without associated symptoms. Her symptoms were associated with large food consumption, no association with exertion. SHe was started on Imdur, though reported worsening migraines. She was seen by GI, who felt symptoms were not soley relatable to GI etiology. She underwent EGD in May 2017 that showed a small hiatal hernia and several small polyps that was biopsied, o/w no acute findings. She was seen in the ED on 08/07/16 with complaints of palpitations, with a self checked heart rate of 160 bpm at home. Upon her arrival to the ED her heart rate had normalized. She reported to the ED a self reported remote history of SVT s/p prior ablation in the lat 1990's. Troponin negative x 2, K+ 3.8, EKG non-acute with nonspecific st/t changes. Normal vitals  and exam. She was very stressed at the time her symptoms began.   Past Medical History:  Diagnosis Date  . AAA (abdominal aortic aneurysm) ([place])    a. Chronic w/o evidence of aneurysmal dil on CTA 01/2016.  Marland Kitchen Allergy   . Anxiety   . Asthma   . CAD (coronary artery disease)    a. 08/2010 s/p CABG x 1 (VG->RCA) @ time of Ao dissection repair; b. 01/2016 Lexiscan MV: mid antsept/apical defect w/ ? peri-infarct ischemia-->likely attenuation-->Med Rx.  . Chronic combined systolic (congestive) and diastolic (congestive) heart failure ([place])    a. 2012 EF 30-35%; b. 04/2015 EF 40-45%; c. 01/2016 Echo: Ef 55-65%, nl AoV bioprosthesis, nl RV, nl PASP.  Marland Kitchen Depression   . Gallbladder sludge   . GERD (gastroesophageal reflux disease)   . History of Bicuspid Aortic Valve    a. 08/2010 s/p AVR @ time of Ao dissection repair; b. 01/2016 Echo: Ef 55-65%, nl AoV bioprosthesis, nl RV, nl PASP.  Marland Kitchen Hx of repair of dissecting thoracic aortic aneurysm, Stanford type A    a. 08/2010 s/p repair with AVR and VG->RCA; b. 01/2016 CTA: stable appearance of Asc Thoracic Aortic graft. Opacification of flase lumen of chronic abd Ao dissection w/ retrograde flow through lumbar arteries. No aneurysmal dil of flase lumen.  . Hyperlipidemia   . Hypertensive heart disease   . Marfan syndrome   . Migraine   . Obstructive sleep apnea   . Osteoporosis   . RA (rheumatoid arthritis) ([place])    a. Followed by Dr. [name]    Past Surgical History:  Procedure Laterality Date  . ABDOMINAL AORTIC ANEURYSM REPAIR    . CARDIAC CATHETERIZATION  2001  . [place]  . CHOLECYSTECTOMY    . FRACTURE SURGERY     shoulder replacement  . ORIF DISTAL RADIUS FRACTURE      Current Outpatient Prescriptions  Medication Sig Dispense Refill  . albuterol (PROVENTIL HFA;VENTOLIN HFA) 108 (90 Base) MCG/ACT inhaler Inhale 2 puffs into the lungs every 4 (four) hours as needed for wheezing or shortness of breath.    . ALPRAZolam  (XANAX) 0.5 MG tablet Take 1 tablet (0.5 mg total) by mouth daily as needed for anxiety. 30 tablet 0  . aspirin EC 81 MG tablet Take 81 mg by mouth at bedtime.    Marland Kitchen atorvastatin (LIPITOR) 20 MG tablet TAKE 1 TABLET EVERY DAY 90 tablet 3  . benzonatate (TESSALON) 100 MG capsule Take 100 mg by mouth 3 (three) times daily as needed for cough.    . carvedilol (COREG) 6.25 MG tablet Take 1 tablet (6.25 mg total) by mouth 2 (two) times daily with a meal. 180 tablet 3  . cholecalciferol (VITAMIN D) 1000 units tablet Take 2,000 Units by mouth daily.    . cyclobenzaprine (FLEXERIL) 10 MG tablet Take 0.5-1 tablets (5-10 mg total) by mouth 3 (three) times daily as needed. For headache 30 tablet 1  . furosemide (LASIX) 20 MG tablet Take 20 mg by mouth daily.    Marland Kitchen lisinopril (PRINIVIL,ZESTRIL) 5 MG tablet TAKE 1/2 TABLET (2.5 MG TOTAL) BY MOUTH DAILY. 45 tablet 3  . magnesium gluconate (MAGONATE) 500 MG tablet Take 500 mg by mouth at bedtime.    . Melatonin 3 MG CAPS Take 1 capsule by mouth at bedtime as needed.    . Misc Natural Products (TART CHERRY ADVANCED) CAPS Take 1 capsule by mouth daily.    . naproxen sodium (ANAPROX) 220 MG tablet Take 440 mg by mouth 2 (two) times daily as needed (for headaches/pain).    . nitroGLYCERIN (NITROSTAT) 0.4 MG SL tablet Place 1 tablet (0.4 mg total) under the tongue every 5 (five) minutes as needed for chest pain. 25 tablet 3  . omeprazole (PRILOSEC) 40 MG capsule Take 1 capsule by mouth daily.    Marland Kitchen oxyCODONE-acetaminophen (PERCOCET/ROXICET) 5-325 MG tablet Take 2 tablets by mouth every 8 (eight) hours as needed for severe pain.    . potassium chloride (K-DUR) 10 MEQ tablet Take 1 tablet (10 mEq total) by mouth 2 (two) times daily as needed. 180 tablet 3  . promethazine (PHENERGAN) 25 MG tablet Take 0.5 tablets (12.5 mg total) by mouth every 8 (eight) hours as needed for nausea. 30 tablet 1  . venlafaxine XR (EFFEXOR-XR) 75 MG 24 hr capsule Take 75 mg by mouth daily with  breakfast.     No current facility-administered medications for this visit.     Allergies:   Alendronate sodium; Hydrocod polst-cpm polst er; Hydrocodone-acetaminophen; Oxycodone; Peanut-containing drug products; and Prednisone   Social History:  The patient  reports that she has never smoked. She has never used smokeless tobacco. She reports that she drinks alcohol. She reports that she does not use drugs.   Family History:  The patient's family history includes Aneurysm in her paternal grandmother; Arthritis in her paternal grandfather, paternal grandmother, sister, and sister; Colon cancer in her maternal grandmother; Depression in her mother; Diabetes in her paternal grandmother and sister; Hypertension in her mother; Irritable bowel syndrome in her mother; Lymphoma in her maternal  grandmother; Nephrolithiasis in her sister; Osteoporosis in her mother and sister; Stroke in her maternal grandmother and mother.  ROS:   ROS   PHYSICAL EXAM: *** VS:  There were no vitals taken for this visit. BMI: There is no height or weight on file to calculate BMI.  Physical Exam   EKG:  Was ordered and interpreted by me today. Shows ***  Recent Labs: 10/27/2015: ALT 15 07/12/2016: TSH 0.77 08/07/2016: BUN 27; Creatinine, Ser 1.15; Hemoglobin 13.7; Platelets 228; Potassium 3.8; Sodium 137  10/27/2015: Chol/HDL Ratio 3.1; Cholesterol, Total 144; HDL 46; LDL Calculated 81; Triglycerides 87   CrCl cannot be calculated (Unknown ideal weight.).   Wt Readings from Last 3 Encounters:  08/13/16 187 lb 4 oz (84.9 kg)  08/07/16 182 lb (82.6 kg)  08/03/16 184 lb 12 oz (83.8 kg)     Other studies reviewed: Additional studies/records reviewed today include: summarized above  ASSESSMENT AND PLAN:  1. ***  Disposition: F/u with *** in   Current medicines are reviewed at length with the patient today.  The patient did not have any concerns regarding medicines.  Melvern Banker PA-C 08/31/2016 5:06 PM      [address] [place], [place] [address] [phone]

## 2016-09-01 ENCOUNTER — Ambulatory Visit: Payer: Medicare HMO | Admitting: Physician Assistant

## 2016-09-01 NOTE — Progress Notes (Signed)
Cardiology Office Note Date:  09/02/2016  Patient ID:  Rachel Torres, DOB July 07, 1956, MRN [id number] PCP:  Loura Pardon, MD  Cardiologist:  Dr. [name], MD    Chief Complaint: ED follow up for palpitations   History of Present Illness: Rachel Torres is a 60 y.o. female with history of CAD s/p 1 vessel CABG (VG-RCA) in 08/2010 coupled with ascending aortic dissection repair and bioprosthetic AVR at the same time. She also has a history of stable AAA by last imaging, marfan syndrome, asthma, hypertensive heart disease, chronic combined CHF with subsequent normalization of her EF, migraine disorder, and anxiety who presents for ED follow up for palpitations.   Nuclear stress test at [facility] in 08/2012 without ischemia and EF of 52%. Prior echo from 04/2015 showed an EF of 40-45% which was improved from prior. Most recent echo from 01/2016 showed normalization of her EF to A999333, LV diastolic function was normal, septal motion c/w post-operative state, normal functioning bioprosthetic arotic valve without stenosis, PASP normal.   Recently admitted to [facility] in 01/2016 for chest pain. She ruled out and underwent Lexiscan Myoview that was low-risk with probable attenuation artifact. It was advised to only pursue invasive evaluation if her symptoms persisted. In hospital follow up in March she continued to note intermittent chest pain without associated symptoms. Her symptoms were associated with large food consumption, no association with exertion. She was started on Imdur, initially with good response, though reported worsening migraines. She was seen by GI, who felt symptoms were not soley relatable to GI etiology. She underwent EGD in May 2017 that showed a small hiatal hernia and several small polyps that was biopsied, o/w no acute findings. She was seen in the ED on 08/07/16 with complaints of palpitations and migraine with a self checked heart rate of 160 bpm at home. Upon her arrival to the ED her heart  rate had normalized. She reported to the ED a self reported remote history of SVT vs Afib s/p prior ablation in the late 1990's. No records on file in [place] or [place]. Troponin negative x 2, K+ 3.8, EKG non-acute with nonspecific st/t changes. CTA chest non-acute. Head CT negative. Normal vitals and exam. Prior TSH from 07/12/16 normal. She was very stressed at the time her symptoms began.  She comes in today reporting an episode of palpitations in mid August that lasted approximately 1 hour before self resolving. She had been and continues to be under increased stress at home with the sale of her house, sale of her mother's house, and her step-daughter is moving also. At that time she developed palpitations her step-daughter's dogs had been living with the patient for 4 days and this was weighing heavily on the patient, leading to a panic attack followed by the development of palpitations with a heart rate in the 160 to 180 bpm range. She initially tells me today she had an Afib ablation in 1999, though was advised to valsalva with any subsequent episodes and was also initially given adenosine. She has not had any further episodes since her reported ablation in 1999 until mid August 2017 as above. During her episode of palpitations she denied any increased SOB, nausea, vomiting, diaphoresis, dizziness, presyncope, or syncope. She does report 3 subsequent episodes of possible panic attacks vs palpitations while sleeping since she was seen in the ED as above. She reports back in the 1990's this is how her palpitations began, while sleeping. No chest pain. She drinks 2 cups  of coffee daily with 1/4 containing caffeine and 3/4 being caffeine-free coffee. She denies any illegal drugs.    Past Medical History:  Diagnosis Date  . AAA (abdominal aortic aneurysm) ([place])    a. Chronic w/o evidence of aneurysmal dil on CTA 01/2016.  Marland Kitchen Allergy   . Anxiety   . Asthma   . CAD (coronary artery disease)    a.  08/2010 s/p CABG x 1 (VG->RCA) @ time of Ao dissection repair; b. 01/2016 Lexiscan MV: mid antsept/apical defect w/ ? peri-infarct ischemia-->likely attenuation-->Med Rx.  . Chronic combined systolic (congestive) and diastolic (congestive) heart failure ([place])    a. 2012 EF 30-35%; b. 04/2015 EF 40-45%; c. 01/2016 Echo: Ef 55-65%, nl AoV bioprosthesis, nl RV, nl PASP.  Marland Kitchen Depression   . Gallbladder sludge   . GERD (gastroesophageal reflux disease)   . History of Bicuspid Aortic Valve    a. 08/2010 s/p AVR @ time of Ao dissection repair; b. 01/2016 Echo: Ef 55-65%, nl AoV bioprosthesis, nl RV, nl PASP.  Marland Kitchen Hx of repair of dissecting thoracic aortic aneurysm, Stanford type A    a. 08/2010 s/p repair with AVR and VG->RCA; b. 01/2016 CTA: stable appearance of Asc Thoracic Aortic graft. Opacification of flase lumen of chronic abd Ao dissection w/ retrograde flow through lumbar arteries. No aneurysmal dil of flase lumen.  . Hyperlipidemia   . Hypertensive heart disease   . Marfan syndrome   . Migraine   . Obstructive sleep apnea   . Osteoporosis   . RA (rheumatoid arthritis) ([place])    a. Followed by Dr. [name]    Past Surgical History:  Procedure Laterality Date  . ABDOMINAL AORTIC ANEURYSM REPAIR    . CARDIAC CATHETERIZATION  2001  . [place]  . CHOLECYSTECTOMY    . FRACTURE SURGERY     shoulder replacement  . ORIF DISTAL RADIUS FRACTURE      Current Outpatient Prescriptions  Medication Sig Dispense Refill  . albuterol (PROVENTIL HFA;VENTOLIN HFA) 108 (90 Base) MCG/ACT inhaler Inhale 2 puffs into the lungs every 4 (four) hours as needed for wheezing or shortness of breath.    . ALPRAZolam (XANAX) 0.5 MG tablet Take 1 tablet (0.5 mg total) by mouth daily as needed for anxiety. 30 tablet 0  . aspirin EC 81 MG tablet Take 81 mg by mouth at bedtime.    Marland Kitchen atorvastatin (LIPITOR) 20 MG tablet TAKE 1 TABLET EVERY DAY 90 tablet 3  . benzonatate (TESSALON) 100 MG capsule Take 100 mg  by mouth 3 (three) times daily as needed for cough.    . carvedilol (COREG) 6.25 MG tablet Take 1 tablet (6.25 mg total) by mouth 2 (two) times daily with a meal. 180 tablet 3  . cholecalciferol (VITAMIN D) 1000 units tablet Take 2,000 Units by mouth daily.    . cyclobenzaprine (FLEXERIL) 10 MG tablet Take 0.5-1 tablets (5-10 mg total) by mouth 3 (three) times daily as needed. For headache 30 tablet 1  . furosemide (LASIX) 20 MG tablet Take 20 mg by mouth daily.    Marland Kitchen lisinopril (PRINIVIL,ZESTRIL) 5 MG tablet TAKE 1/2 TABLET (2.5 MG TOTAL) BY MOUTH DAILY. 45 tablet 3  . magnesium gluconate (MAGONATE) 500 MG tablet Take 500 mg by mouth at bedtime.    . Melatonin 3 MG CAPS Take 1 capsule by mouth at bedtime as needed.    . Misc Natural Products (TART CHERRY ADVANCED) CAPS Take 1 capsule by mouth daily.    Marland Kitchen  naproxen sodium (ANAPROX) 220 MG tablet Take 440 mg by mouth 2 (two) times daily as needed (for headaches/pain).    . nitroGLYCERIN (NITROSTAT) 0.4 MG SL tablet Place 1 tablet (0.4 mg total) under the tongue every 5 (five) minutes as needed for chest pain. 25 tablet 3  . omeprazole (PRILOSEC) 40 MG capsule Take 1 capsule by mouth daily.    Marland Kitchen oxyCODONE-acetaminophen (PERCOCET/ROXICET) 5-325 MG tablet Take 2 tablets by mouth every 8 (eight) hours as needed for severe pain.    . potassium chloride (K-DUR) 10 MEQ tablet Take 1 tablet (10 mEq total) by mouth 2 (two) times daily as needed. 180 tablet 3  . promethazine (PHENERGAN) 25 MG tablet Take 0.5 tablets (12.5 mg total) by mouth every 8 (eight) hours as needed for nausea. 30 tablet 1  . venlafaxine XR (EFFEXOR-XR) 75 MG 24 hr capsule Take 75 mg by mouth daily with breakfast.     No current facility-administered medications for this visit.     Allergies:   Alendronate sodium; Hydrocod polst-cpm polst er; Hydrocodone-acetaminophen; Oxycodone; Peanut-containing drug products; and Prednisone   Social History:  The patient  reports that she has never  smoked. She has never used smokeless tobacco. She reports that she drinks alcohol. She reports that she does not use drugs.   Family History:  The patient's family history includes Aneurysm in her paternal grandmother; Arthritis in her paternal grandfather, paternal grandmother, sister, and sister; Colon cancer in her maternal grandmother; Depression in her mother; Diabetes in her paternal grandmother and sister; Hypertension in her mother; Irritable bowel syndrome in her mother; Lymphoma in her maternal grandmother; Nephrolithiasis in her sister; Osteoporosis in her mother and sister; Stroke in her maternal grandmother and mother.  ROS:   Review of Systems  Constitutional: Negative for chills, diaphoresis, fever, malaise/fatigue and weight loss.  HENT: Negative for congestion.   Eyes: Negative for discharge and redness.  Respiratory: Negative for cough, hemoptysis, sputum production, shortness of breath and wheezing.   Cardiovascular: Positive for palpitations. Negative for chest pain, orthopnea, claudication, leg swelling and PND.  Gastrointestinal: Negative for abdominal pain, blood in stool, heartburn, melena, nausea and vomiting.  Genitourinary: Negative for hematuria.  Musculoskeletal: Negative for falls and myalgias.  Skin: Negative for rash.  Neurological: Negative for dizziness, tingling, tremors, sensory change, speech change, focal weakness, loss of consciousness and weakness.  Endo/Heme/Allergies: Does not bruise/bleed easily.  Psychiatric/Behavioral: Negative for substance abuse. The patient is nervous/anxious.   All other systems reviewed and are negative.    PHYSICAL EXAM:  VS:  BP 128/82   Pulse 67   Ht 5\' 5"  (1.651 m)   Wt 183 lb 12.8 oz (83.4 kg)   BMI 30.59 kg/m  BMI: Body mass index is 30.59 kg/m.  Physical Exam  Constitutional: She is oriented to person, place, and time. She appears well-developed and well-nourished.  HENT:  Head: Normocephalic and atraumatic.    Eyes: Right eye exhibits no discharge. Left eye exhibits no discharge.  Neck: Normal range of motion. No JVD present.  Cardiovascular: Normal rate, regular rhythm, S1 normal, S2 normal and normal heart sounds.  Exam reveals no distant heart sounds, no friction rub, no midsystolic click and no opening snap.   No murmur heard. Pulmonary/Chest: Effort normal and breath sounds normal. No respiratory distress. She has no decreased breath sounds. She has no wheezes. She has no rales. She exhibits no tenderness.  Abdominal: Soft. She exhibits no distension. There is no tenderness.  Musculoskeletal: She  exhibits no edema.  Neurological: She is alert and oriented to person, place, and time.  Skin: Skin is warm and dry. No cyanosis. Nails show no clubbing.  Psychiatric: She has a normal mood and affect. Her speech is normal and behavior is normal. Judgment and thought content normal.    EKG:  Was ordered and interpreted by me today. Shows NSR, 67 bpm, TWI V2-V4, nonspecific up sloping st depression V5  Recent Labs: 10/27/2015: ALT 15 07/12/2016: TSH 0.77 08/07/2016: BUN 27; Creatinine, Ser 1.15; Hemoglobin 13.7; Platelets 228; Potassium 3.8; Sodium 137  10/27/2015: Chol/HDL Ratio 3.1; Cholesterol, Total 144; HDL 46; LDL Calculated 81; Triglycerides 87   CrCl cannot be calculated (Patient's most recent lab result is older than the maximum 21 days allowed.).   Wt Readings from Last 3 Encounters:  09/02/16 183 lb 12.8 oz (83.4 kg)  08/13/16 187 lb 4 oz (84.9 kg)  08/07/16 182 lb (82.6 kg)     Other studies reviewed: Additional studies/records reviewed today include: summarized above  ASSESSMENT AND PLAN:  1. CAD as above: No symptoms concerning for angina at this time. Continue current medical management. No plans for ischemic evaluation currently.   2. Palpitations: She reports a history of possible SVT vs Afib ablation in 1999 at [facility]. No record of this in Epic or Care Everywhere. She had been  symptom free until mid August when she was stressed-out leading to the development of palpitations that resolved with valsalva. She has possibly had 3 subsequent episodes while sleeping, though she is not certain. Check 15-day event monitor. Recheck potassium now that she has been taking an extra dose since her ED visit. Check magnesium. Consider prn propranolol for palpitations. Continue Coreg 6.25 mg bid. There is confusion regarding her initial rhythm that led to her ablation in 1999 (SVT vs Afib) as she has reported both rhythms and there is no documentation this this in her prior notes or in Care Everywhere. Given no documented evidence of Afib, she is not a candidate for full-dose anticoagulation at this time. Should we uncover Afib she would then possibly be a candidate for anticoagulation. She drinks 2 cups of coffee daily with only 1/4 of those containing caffeine and the remaining 3/4 without caffeine.   3. Hypertensive heart disease: BP well controlled today. Continue current medications.   4. Chronic combined CHF: She does not appear to be volume overloaded at this time. Continue current medications. CHF education provided.   5. Aortic dissection: Stable at most recent CT chest as above. Followed by [facility]   6. Hypokalemia: She has been taking extra potassium since she was seen in the ED. Check bmet and magnesium today.   Disposition: F/u with Dr. [name] at regularly scheduled visit in 6 weeks.   Current medicines are reviewed at length with the patient today.  The patient did not have any concerns regarding medicines.  Melvern Banker PA-C 09/02/2016 2:39 PM     [address] [place] [place], [place] [address] [phone]

## 2016-09-02 ENCOUNTER — Ambulatory Visit (INDEPENDENT_AMBULATORY_CARE_PROVIDER_SITE_OTHER): Payer: Medicare HMO | Admitting: Physician Assistant

## 2016-09-02 ENCOUNTER — Encounter: Payer: Self-pay | Admitting: Physician Assistant

## 2016-09-02 VITALS — BP 128/82 | HR 67 | Ht 65.0 in | Wt 183.8 lb

## 2016-09-02 DIAGNOSIS — I119 Hypertensive heart disease without heart failure: Secondary | ICD-10-CM | POA: Diagnosis not present

## 2016-09-02 DIAGNOSIS — I7103 Dissection of thoracoabdominal aorta: Secondary | ICD-10-CM | POA: Diagnosis not present

## 2016-09-02 DIAGNOSIS — I2510 Atherosclerotic heart disease of native coronary artery without angina pectoris: Secondary | ICD-10-CM | POA: Diagnosis not present

## 2016-09-02 DIAGNOSIS — R002 Palpitations: Secondary | ICD-10-CM | POA: Diagnosis not present

## 2016-09-02 DIAGNOSIS — I5042 Chronic combined systolic (congestive) and diastolic (congestive) heart failure: Secondary | ICD-10-CM

## 2016-09-02 DIAGNOSIS — E876 Hypokalemia: Secondary | ICD-10-CM

## 2016-09-02 MED ORDER — LISINOPRIL 5 MG PO TABS: ORAL_TABLET | ORAL | 1 refills | Status: DC

## 2016-09-02 MED ORDER — FUROSEMIDE 20 MG PO TABS: 20.0000 mg | ORAL_TABLET | Freq: Every day | ORAL | 1 refills | Status: DC

## 2016-09-02 MED ORDER — CARVEDILOL 6.25 MG PO TABS
6.2500 mg | ORAL_TABLET | Freq: Two times a day (BID) | ORAL | 1 refills | Status: DC
Start: 2016-09-02 — End: 2017-01-11

## 2016-09-02 MED ORDER — POTASSIUM CHLORIDE ER 10 MEQ PO TBCR: 10.0000 meq | EXTENDED_RELEASE_TABLET | Freq: Two times a day (BID) | ORAL | 1 refills | Status: DC | PRN

## 2016-09-02 MED ORDER — ATORVASTATIN CALCIUM 20 MG PO TABS: 20.0000 mg | ORAL_TABLET | Freq: Every day | ORAL | 1 refills | Status: DC

## 2016-09-02 NOTE — Patient Instructions (Signed)
Medication Instructions:  Your physician recommends that you continue on your current medications as directed. Please refer to the Current Medication list given to you today.   Labwork: Bmet and Magnesium today  Testing/Procedures: Your physician has recommended that you wear an event monitor. Event monitors are medical devices that record the heart's electrical activity. Doctors most often [place] these monitors to diagnose arrhythmias. Arrhythmias are problems with the speed or rhythm of the heartbeat. The monitor is a small, portable device. You can wear one while you do your normal daily activities. This is usually used to diagnose what is causing palpitations/syncope (passing out).    Follow-Up: Follow up as planned with Dr.Gollan  Any Other Special Instructions Will Be Listed Below (If Applicable).     If you need a refill on your cardiac medications before your next appointment, please call your pharmacy.

## 2016-09-03 LAB — BASIC METABOLIC PANEL
BUN/Creatinine Ratio: 24 — ABNORMAL HIGH (ref 9–23)
BUN: 20 mg/dL (ref 6–24)
CO2: 19 mmol/L (ref 18–29)
Calcium: 9 mg/dL (ref 8.7–10.2)
Chloride: 101 mmol/L (ref 96–106)
Creatinine, Ser: 0.82 mg/dL (ref 0.57–1.00)
GFR calc Af Amer: 91 mL/min/{1.73_m2} (ref >59)
GFR calc non Af Amer: 79 mL/min/{1.73_m2} (ref >59)
Glucose: 98 mg/dL (ref 65–99)
Potassium: 4.1 mmol/L (ref 3.5–5.2)
Sodium: 139 mmol/L (ref 134–144)

## 2016-09-03 LAB — MAGNESIUM: Magnesium: 2.1 mg/dL (ref 1.6–2.3)

## 2016-09-13 ENCOUNTER — Telehealth: Payer: Self-pay | Admitting: Cardiovascular Disease

## 2016-09-13 NOTE — Telephone Encounter (Signed)
Please call patient as she hasn't received her heart monitor.

## 2016-09-13 NOTE — Telephone Encounter (Addendum)
[name] info was not entered into [employer]. Entered her into [employer]; pt should receive a call from them today to verify address. Pt is aware and appreciative of the call.

## 2016-09-15 ENCOUNTER — Ambulatory Visit (INDEPENDENT_AMBULATORY_CARE_PROVIDER_SITE_OTHER): Payer: Medicare HMO | Admitting: Psychology

## 2016-09-15 DIAGNOSIS — F4323 Adjustment disorder with mixed anxiety and depressed mood: Secondary | ICD-10-CM | POA: Diagnosis not present

## 2016-09-16 ENCOUNTER — Ambulatory Visit (INDEPENDENT_AMBULATORY_CARE_PROVIDER_SITE_OTHER): Payer: Medicare HMO

## 2016-09-16 DIAGNOSIS — R002 Palpitations: Secondary | ICD-10-CM

## 2016-09-22 ENCOUNTER — Ambulatory Visit (INDEPENDENT_AMBULATORY_CARE_PROVIDER_SITE_OTHER): Payer: Medicare HMO | Admitting: Psychology

## 2016-09-22 DIAGNOSIS — F4323 Adjustment disorder with mixed anxiety and depressed mood: Secondary | ICD-10-CM

## 2016-09-24 ENCOUNTER — Other Ambulatory Visit: Payer: Self-pay

## 2016-09-27 ENCOUNTER — Ambulatory Visit (INDEPENDENT_AMBULATORY_CARE_PROVIDER_SITE_OTHER): Payer: Commercial Managed Care - HMO | Admitting: Gastroenterology

## 2016-09-27 ENCOUNTER — Encounter: Payer: Self-pay | Admitting: Gastroenterology

## 2016-09-27 VITALS — BP 128/57 | HR 74 | Temp 98.1°F | Ht 65.0 in | Wt 180.0 lb

## 2016-09-27 DIAGNOSIS — K648 Other hemorrhoids: Secondary | ICD-10-CM

## 2016-09-27 NOTE — Progress Notes (Signed)
Gastroenterology Consultation  Referring Provider:     [name], MD Primary Care Physician:  Loura Pardon, MD Primary Gastroenterologist:  Dr. Allen Norris     Reason for Consultation:     Hemorrhoids        HPI:   Rachel Torres is a 60 y.o. y/o female referred for consultation & management of Hemorrhoids by Dr. Loura Pardon, MD.  This patient comes in today with a report of having hemorrhoids for the last 30 odd years. She reports that she had a colonoscopy a few years ago that showed her to have both internal and external hemorrhoids. The patient reports that it is now interfering with her bowel movements and causing her to have a lot of trouble cleaning up after moving her bowels. The patient states she has tried creams and sounds in the past without any success. The patient states she is quite thin up with the hemorrhoids. The patient denies any family history of colon cancer colon polyps and she also denies having any polyps herself. The patient does have some blood when her hemorrhoids are inflamed which she states they are not at the present time. There is no report of any nausea vomiting or unexplained weight loss. The patient had a dissecting aortic aneurysm in the past and was treated with surgery for that.  Past Medical History:  Diagnosis Date  . AAA (abdominal aortic aneurysm) ([place])    a. Chronic w/o evidence of aneurysmal dil on CTA 01/2016.  Marland Kitchen Allergy   . Anxiety   . Asthma   . CAD (coronary artery disease)    a. 08/2010 s/p CABG x 1 (VG->RCA) @ time of Ao dissection repair; b. 01/2016 Lexiscan MV: mid antsept/apical defect w/ ? peri-infarct ischemia-->likely attenuation-->Med Rx.  . Chronic combined systolic (congestive) and diastolic (congestive) heart failure    a. 2012 EF 30-35%; b. 04/2015 EF 40-45%; c. 01/2016 Echo: Ef 55-65%, nl AoV bioprosthesis, nl RV, nl PASP.  Marland Kitchen Depression   . Gallbladder sludge   . GERD (gastroesophageal reflux disease)   . History of Bicuspid  Aortic Valve    a. 08/2010 s/p AVR @ time of Ao dissection repair; b. 01/2016 Echo: Ef 55-65%, nl AoV bioprosthesis, nl RV, nl PASP.  Marland Kitchen Hx of repair of dissecting thoracic aortic aneurysm, Stanford type A    a. 08/2010 s/p repair with AVR and VG->RCA; b. 01/2016 CTA: stable appearance of Asc Thoracic Aortic graft. Opacification of flase lumen of chronic abd Ao dissection w/ retrograde flow through lumbar arteries. No aneurysmal dil of flase lumen.  . Hyperlipidemia   . Hypertensive heart disease   . Marfan syndrome   . Migraine   . Obstructive sleep apnea   . Osteoporosis   . RA (rheumatoid arthritis) ([place])    a. Followed by Dr. [name]    Past Surgical History:  Procedure Laterality Date  . ABDOMINAL AORTIC ANEURYSM REPAIR    . CARDIAC CATHETERIZATION  2001  . [place]  . CHOLECYSTECTOMY    . FRACTURE SURGERY     shoulder replacement  . ORIF DISTAL RADIUS FRACTURE      Prior to Admission medications   Medication Sig Start Date End Date Taking? Authorizing Provider  albuterol (PROVENTIL HFA;VENTOLIN HFA) 108 (90 Base) MCG/ACT inhaler Inhale 2 puffs into the lungs every 4 (four) hours as needed for wheezing or shortness of breath.   Yes Historical Provider, MD  ALPRAZolam Duanne Moron) 0.5 MG tablet Take  1 tablet (0.5 mg total) by mouth daily as needed for anxiety. 07/15/16  Yes [name], MD  amLODipine (NORVASC) 10 MG tablet Take by mouth. 02/16/10  Yes Historical Provider, MD  aspirin EC 81 MG tablet Take 81 mg by mouth at bedtime.   Yes Historical Provider, MD  atorvastatin (LIPITOR) 20 MG tablet Take 1 tablet (20 mg total) by mouth daily. 09/02/16  Yes Ryan M Dunn, PA-C  B Complex Vitamins (VITAMIN B COMPLEX PO) Take by mouth. 02/16/10  Yes Historical Provider, MD  benzonatate (TESSALON) 100 MG capsule Take 100 mg by mouth 3 (three) times daily as needed for cough.   Yes Historical Provider, MD  carvedilol (COREG) 6.25 MG tablet Take 1 tablet (6.25 mg total) by mouth  2 (two) times daily with a meal. 09/02/16  Yes Ryan M Dunn, PA-C  cholecalciferol (VITAMIN D) 1000 units tablet Take 2,000 Units by mouth daily.   Yes Historical Provider, MD  cyclobenzaprine (FLEXERIL) 10 MG tablet Take 0.5-1 tablets (5-10 mg total) by mouth 3 (three) times daily as needed. For headache 08/03/16  Yes [name], MD  furosemide (LASIX) 20 MG tablet Take 1 tablet (20 mg total) by mouth daily. 09/02/16 11/26/20 Yes Ryan M Dunn, PA-C  lisinopril (PRINIVIL,ZESTRIL) 5 MG tablet TAKE 1/2 TABLET (2.5 MG TOTAL) BY MOUTH DAILY. 09/02/16  Yes Ryan M Dunn, PA-C  magnesium gluconate (MAGONATE) 500 MG tablet Take 500 mg by mouth at bedtime.   Yes Historical Provider, MD  Melatonin 3 MG CAPS Take 1 capsule by mouth at bedtime as needed.   Yes Historical Provider, MD  Misc Natural Products (TART CHERRY ADVANCED) CAPS Take 1 capsule by mouth daily.   Yes Historical Provider, MD  naproxen sodium (ANAPROX) 220 MG tablet Take 440 mg by mouth 2 (two) times daily as needed (for headaches/pain).   Yes Historical Provider, MD  nitroGLYCERIN (NITROSTAT) 0.4 MG SL tablet Place 1 tablet (0.4 mg total) under the tongue every 5 (five) minutes as needed for chest pain. 10/24/15  Yes Minna Merritts, MD  omeprazole (PRILOSEC) 40 MG capsule Take 1 capsule by mouth daily. 06/28/16  Yes Historical Provider, MD  omeprazole-sodium bicarbonate (ZEGERID) 40-1100 MG capsule Take by mouth. 05/20/10  Yes Historical Provider, MD  oxyCODONE-acetaminophen (PERCOCET/ROXICET) 5-325 MG tablet Take 2 tablets by mouth every 8 (eight) hours as needed for severe pain.   Yes Historical Provider, MD  Potassium Aminobenzoate 500 MG TABS Take 500 mg by mouth.   Yes Historical Provider, MD  potassium chloride (K-DUR) 10 MEQ tablet Take 1 tablet (10 mEq total) by mouth 2 (two) times daily as needed. 09/02/16  Yes Ryan M Dunn, PA-C  promethazine (PHENERGAN) 25 MG tablet Take 0.5 tablets (12.5 mg total) by mouth every 8 (eight) hours as needed for  nausea. 08/03/16  Yes [name], MD  traMADol (ULTRAM) 50 MG tablet Take 100 mg by mouth. 07/28/15  Yes Historical Provider, MD  urea (X-VIATE) 40 % CREA  05/02/14  Yes Historical Provider, MD  venlafaxine XR (EFFEXOR-XR) 75 MG 24 hr capsule Take 75 mg by mouth daily with breakfast.   Yes Historical Provider, MD  zolpidem (AMBIEN) 10 MG tablet Take by mouth. 02/16/10  Yes Historical Provider, MD    Family History  Problem Relation Age of Onset  . Hypertension Mother   . Depression Mother   . Osteoporosis Mother   . Stroke Mother   . Irritable bowel syndrome Mother   . Arthritis  Sister   . Diabetes Sister   . Nephrolithiasis Sister   . Osteoporosis Sister   . Arthritis Sister   . Stroke Maternal Grandmother   . Colon cancer Maternal Grandmother   . Lymphoma Maternal Grandmother   . Aneurysm Paternal Grandmother   . Diabetes Paternal Grandmother   . Arthritis Paternal Grandmother   . Arthritis Paternal Grandfather      Social History  Substance Use Topics  . Smoking status: Never Smoker  . Smokeless tobacco: Never Used  . Alcohol use 0.0 oz/week     Comment: occassional    Allergies as of 09/27/2016 - Review Complete 09/27/2016  Allergen Reaction Noted  . Alendronate sodium Other (See Comments)   . Hydrocod polst-cpm polst er Itching 09/20/2011  . Hydrocodone-acetaminophen Itching   . Oxycodone Itching 04/18/2015  . Peanut-containing drug products Other (See Comments)   . Sumatriptan succinate  05/24/2014  . Hydrocodone-guaifenesin Itching 06/07/2013  . Prednisone Rash 07/26/2007    Review of Systems:    All systems reviewed and negative except where noted in HPI.   Physical Exam:  BP (!) 128/57   Pulse 74   Temp 98.1 F (36.7 C) (Oral)   Ht 5\' 5"  (1.651 m)   Wt 180 lb (81.6 kg)   BMI 29.95 kg/m  No LMP recorded. Patient is postmenopausal. Psych:  Alert and cooperative. Normal mood and affect. General:   Alert,  Well-developed, well-nourished, pleasant and  cooperative in NAD Head:  Normocephalic and atraumatic. Eyes:  Sclera clear, no icterus.   Conjunctiva pink. Ears:  Normal auditory acuity. Nose:  No deformity, discharge, or lesions. Mouth:  No deformity or lesions,oropharynx pink & moist. Neck:  Supple; no masses or thyromegaly. Lungs:  Respirations even and unlabored.  Clear throughout to auscultation.   No wheezes, crackles, or rhonchi. No acute distress. Heart:  Regular rate and rhythm; no murmurs, clicks, rubs, or gallops. Abdomen:  Normal bowel sounds.  No bruits.  Soft, non-tender and non-distended without masses, hepatosplenomegaly or hernias noted.  No guarding or rebound tenderness.  Negative Carnett sign.   Rectal:  Small external hemorrhoids with palpable internal hemorrhoids  Msk:  Symmetrical without gross deformities.  Good, equal movement & strength bilaterally. Pulses:  Normal pulses noted. Extremities:  No clubbing or edema.  No cyanosis. Neurologic:  Alert and oriented x3;  grossly normal neurologically. Skin:  Intact without significant lesions or rashes.  No jaundice. Lymph Nodes:  No significant cervical adenopathy. Psych:  Alert and cooperative. Normal mood and affect.  Imaging Studies: No results found.  Assessment and Plan:   Rachel Torres is a 60 y.o. y/o female who comes in today with a history of long-standing hemorrhoids for over 30 years. The patient has tried medical therapy for the hemorrhoids and states that they have not helped. The patient has some irritable bowel syndrome with alternating diarrhea and constipation. The patient would like to be evaluated by a surgeon for resection of her internal hemorrhoids. The patient will be set up with surgery for a hemorrhoidectomy. The patient has been explained the plan and agrees with it.   Note: This dictation was prepared with Dragon dictation along with smaller phrase technology. Any transcriptional errors that result from this process are  unintentional.

## 2016-10-06 ENCOUNTER — Telehealth: Payer: Self-pay | Admitting: *Deleted

## 2016-10-06 ENCOUNTER — Encounter: Payer: Self-pay | Admitting: General Surgery

## 2016-10-06 ENCOUNTER — Ambulatory Visit (INDEPENDENT_AMBULATORY_CARE_PROVIDER_SITE_OTHER): Payer: Medicare HMO | Admitting: Psychology

## 2016-10-06 ENCOUNTER — Ambulatory Visit (INDEPENDENT_AMBULATORY_CARE_PROVIDER_SITE_OTHER): Payer: Commercial Managed Care - HMO | Admitting: General Surgery

## 2016-10-06 VITALS — BP 104/65 | HR 76 | Temp 98.3°F | Ht 65.0 in | Wt 181.0 lb

## 2016-10-06 DIAGNOSIS — F4323 Adjustment disorder with mixed anxiety and depressed mood: Secondary | ICD-10-CM

## 2016-10-06 DIAGNOSIS — K642 Third degree hemorrhoids: Secondary | ICD-10-CM

## 2016-10-06 NOTE — Patient Instructions (Signed)
Please call us if you have questions or concerns

## 2016-10-06 NOTE — Progress Notes (Signed)
Patient ID: Rachel Torres, female   DOB: January 12, 1956, 60 y.o.   MRN: [id number]  CC: Hemorrhoids  HPI Rachel Torres is a 60 y.o. female who presents to clinic today for evaluation of chronic internal hemorrhoids. Patient reports that she's been dealing with some form or fashion of hemorrhoid problems for the last 32 years. She states she has history of irritable bowel syndrome and alternates between diarrhea and constipation. Approximately 3-5 times a year she'll then have worsening hemorrhoidal inflammation and for the last several times this is caused tissue to prolapse out of her rectum that she's had to reduce. She has tried numerous prescriptions for this provided by her primary care. In the short interval they appear to work but that her hemorrhoidal issues return again with her irritable bowels. Patient reports that she's not had any hemorrhoid problems for the last 2 months. She associates this with recent weight loss that she has done on purpose. When her hemorrhoids are flared she reports having difficulty with sanitation and hygiene. She currently denies any fevers, chills, nausea vomiting, chest pain, shortness of breath, diarrhea, constipation.  HPI  Past Medical History:  Diagnosis Date  . AAA (abdominal aortic aneurysm) ([place])    a. Chronic w/o evidence of aneurysmal dil on CTA 01/2016.  Marland Kitchen Allergy   . Anxiety   . Asthma   . CAD (coronary artery disease)    a. 08/2010 s/p CABG x 1 (VG->RCA) @ time of Ao dissection repair; b. 01/2016 Lexiscan MV: mid antsept/apical defect w/ ? peri-infarct ischemia-->likely attenuation-->Med Rx.  . Chronic combined systolic (congestive) and diastolic (congestive) heart failure    a. 2012 EF 30-35%; b. 04/2015 EF 40-45%; c. 01/2016 Echo: Ef 55-65%, nl AoV bioprosthesis, nl RV, nl PASP.  Marland Kitchen Depression   . Gallbladder sludge   . GERD (gastroesophageal reflux disease)   . History of Bicuspid Aortic Valve    a. 08/2010 s/p AVR @ time of Ao dissection  repair; b. 01/2016 Echo: Ef 55-65%, nl AoV bioprosthesis, nl RV, nl PASP.  Marland Kitchen Hx of repair of dissecting thoracic aortic aneurysm, Stanford type A    a. 08/2010 s/p repair with AVR and VG->RCA; b. 01/2016 CTA: stable appearance of Asc Thoracic Aortic graft. Opacification of flase lumen of chronic abd Ao dissection w/ retrograde flow through lumbar arteries. No aneurysmal dil of flase lumen.  . Hyperlipidemia   . Hypertensive heart disease   . Marfan syndrome   . Migraine   . Obstructive sleep apnea   . Osteoporosis   . RA (rheumatoid arthritis) ([place])    a. Followed by Dr. [name]    Past Surgical History:  Procedure Laterality Date  . ABDOMINAL AORTIC ANEURYSM REPAIR    . CARDIAC CATHETERIZATION  2001  . [place]  . CHOLECYSTECTOMY    . FRACTURE SURGERY     shoulder replacement  . ORIF DISTAL RADIUS FRACTURE      Family History  Problem Relation Age of Onset  . Hypertension Mother   . Depression Mother   . Osteoporosis Mother   . Stroke Mother   . Irritable bowel syndrome Mother   . Arthritis Sister   . Diabetes Sister   . Nephrolithiasis Sister   . Osteoporosis Sister   . Arthritis Sister   . Stroke Maternal Grandmother   . Colon cancer Maternal Grandmother   . Lymphoma Maternal Grandmother   . Aneurysm Paternal Grandmother   . Diabetes Paternal Grandmother   .  Arthritis Paternal Grandmother   . Arthritis Paternal Grandfather     Social History Social History  Substance Use Topics  . Smoking status: Never Smoker  . Smokeless tobacco: Never Used  . Alcohol use 0.0 oz/week     Comment: occassional    Allergies  Allergen Reactions  . Alendronate Sodium Other (See Comments)    Reaction:  GI upset   . Ginzing [Ginseng] Other (See Comments)    Hyper and jitery   . Hydrocod Polst-Cpm Polst Er Itching  . Hydrocodone-Acetaminophen Itching  . Oxycodone Itching  . Peanut-Containing Drug Products Other (See Comments)    Reaction:  Migraines   .  Sumatriptan Succinate     contri-indicated w/ heart conditions  . Hydrocodone-Guaifenesin Itching  . Prednisone Rash    Current Outpatient Prescriptions  Medication Sig Dispense Refill  . ALPRAZolam (XANAX) 0.5 MG tablet Take 1 tablet (0.5 mg total) by mouth daily as needed for anxiety. 30 tablet 0  . amLODipine (NORVASC) 10 MG tablet Take by mouth.    Marland Kitchen aspirin EC 81 MG tablet Take 81 mg by mouth at bedtime.    Marland Kitchen atorvastatin (LIPITOR) 20 MG tablet Take 1 tablet (20 mg total) by mouth daily. 90 tablet 1  . carvedilol (COREG) 6.25 MG tablet Take 1 tablet (6.25 mg total) by mouth 2 (two) times daily with a meal. 180 tablet 1  . cholecalciferol (VITAMIN D) 1000 units tablet Take 2,000 Units by mouth daily.    . cyclobenzaprine (FLEXERIL) 10 MG tablet Take 0.5-1 tablets (5-10 mg total) by mouth 3 (three) times daily as needed. For headache 30 tablet 1  . furosemide (LASIX) 20 MG tablet Take 1 tablet (20 mg total) by mouth daily. 90 tablet 1  . lisinopril (PRINIVIL,ZESTRIL) 5 MG tablet TAKE 1/2 TABLET (2.5 MG TOTAL) BY MOUTH DAILY. 45 tablet 1  . magnesium gluconate (MAGONATE) 500 MG tablet Take 500 mg by mouth at bedtime.    . Melatonin 3 MG CAPS Take 1 capsule by mouth at bedtime as needed.    . Misc Natural Products (TART CHERRY ADVANCED) CAPS Take 1 capsule by mouth daily.    . naproxen sodium (ANAPROX) 220 MG tablet Take 440 mg by mouth 2 (two) times daily as needed (for headaches/pain).    . nitroGLYCERIN (NITROSTAT) 0.4 MG SL tablet Place 1 tablet (0.4 mg total) under the tongue every 5 (five) minutes as needed for chest pain. 25 tablet 3  . omeprazole (PRILOSEC) 40 MG capsule Take 1 capsule by mouth daily.    . Potassium Aminobenzoate 500 MG TABS Take 500 mg by mouth.    . promethazine (PHENERGAN) 25 MG tablet Take 0.5 tablets (12.5 mg total) by mouth every 8 (eight) hours as needed for nausea. 30 tablet 1  . venlafaxine XR (EFFEXOR-XR) 75 MG 24 hr capsule Take 75 mg by mouth daily with  breakfast.    . albuterol (PROVENTIL HFA;VENTOLIN HFA) 108 (90 Base) MCG/ACT inhaler Inhale 2 puffs into the lungs every 4 (four) hours as needed for wheezing or shortness of breath.    . benzonatate (TESSALON) 100 MG capsule Take 100 mg by mouth 3 (three) times daily as needed for cough.    Marland Kitchen oxyCODONE-acetaminophen (PERCOCET/ROXICET) 5-325 MG tablet Take 2 tablets by mouth every 8 (eight) hours as needed for severe pain.     No current facility-administered medications for this visit.      Review of Systems A Multi-point review of systems was asked and was negative  except for the findings documented in the history of present illness  Physical Exam Blood pressure 104/65, pulse 76, temperature 98.3 F (36.8 C), temperature source Oral, height 5\' 5"  (1.651 m), weight 82.1 kg (181 lb). CONSTITUTIONAL: No acute distress. EYES: Pupils are equal, round, and reactive to light, Sclera are non-icteric. EARS, NOSE, MOUTH AND THROAT: The oropharynx is clear. The oral mucosa is pink and moist. Hearing is intact to voice. LYMPH NODES:  Lymph nodes in the neck are normal. RESPIRATORY:  Lungs are clear. There is normal respiratory effort, with equal breath sounds bilaterally, and without pathologic use of accessory muscles. CARDIOVASCULAR: Heart is regular without murmurs, gallops, or rubs. GI: The abdomen is  soft, nontender, and nondistended. There are no palpable masses. There is no hepatosplenomegaly. There are normal bowel sounds in all quadrants. GU: Rectal exam shows evidence of a healed 6:00 anal fissure. Decompressed external hemorrhoidal tissue from previous external hemorrhoids. Visible and palpable evidence of internal hemorrhoids on 2 internal columns.  No active bleeding on exam today and normal sphincter tone. MUSCULOSKELETAL: Normal muscle strength and tone. No cyanosis or edema.   SKIN: Turgor is good and there are no pathologic skin lesions or ulcers. NEUROLOGIC: Motor and sensation is  grossly normal. Cranial nerves are grossly intact. PSYCH:  Oriented to person, place and time. Affect is normal.  Data Reviewed No images are labs reviewed. I have personally reviewed the patient's imaging, laboratory findings and medical records.    Assessment    Grade 3 internal hemorrhoids    Plan    60 year old female who per history has had grade 3 internal hemorrhoids with intermittent prolapse requiring reduction. This fits with my exam that was performed today. Had a long conversation with the patient about surgical options and treatments of hemorrhoidal disease. Talked about the preoperative workup, the operative planning and the postoperative course. After this discussion the patient voiced uncertainty as to whether or not she was truly ready for surgical intervention. Primarily, she is worried about the postoperative pain. After this conversation I informed the patient that she did not have to make a decision today. She is going to think it over and discuss with her family about whether or not she will proceed with surgery. All questions were answered to the patient's satisfaction and she'll follow-up in clinic on an as-needed basis or call with further questions.     Time spent with the patient was 30 minutes, with more than 50% of the time spent in face-to-face education, counseling and care coordination.     Clayburn Pert, MD FACS General Surgeon 10/06/2016, 3:22 PM

## 2016-10-06 NOTE — Telephone Encounter (Signed)
Reviewed results of holter monitor and recommendations for the carvedilol. She verbalized understanding and she states that she is just going to stay on current dose at this time and discuss with Dr. [name] at her next office visit that is coming up. Let her know to please call back if she has any increase in symptoms or any questions. She was appreciative for the call and had no further questions.

## 2016-10-06 NOTE — Telephone Encounter (Signed)
-----   Message from Rise Mu, PA-C sent at 10/05/2016  9:37 PM EDT ----- Please call the patient. Monitor showed NSR with occasional PVCs and PACs. Heart rate in the 80's to 90's bmp mostly, with an occasional episode of sinus tachycardia with heart rate in the low 100's bpm that was associated with palpitations/sensation of rapid heart rate. No significant abnormal rhythm with symptom of chest pressure. Can undergo a trial of increasing Coreg to 12.5 mg bid. Should she become bradycardic with heart rate in the low 50's bpm would go back to Coreg 6.25 mg bid.

## 2016-10-13 ENCOUNTER — Ambulatory Visit (INDEPENDENT_AMBULATORY_CARE_PROVIDER_SITE_OTHER): Payer: Medicare HMO | Admitting: Psychology

## 2016-10-13 DIAGNOSIS — F4323 Adjustment disorder with mixed anxiety and depressed mood: Secondary | ICD-10-CM

## 2016-10-26 ENCOUNTER — Encounter: Payer: Self-pay | Admitting: Cardiovascular Disease

## 2016-10-26 ENCOUNTER — Ambulatory Visit (INDEPENDENT_AMBULATORY_CARE_PROVIDER_SITE_OTHER): Payer: Commercial Managed Care - HMO | Admitting: Cardiovascular Disease

## 2016-10-26 ENCOUNTER — Encounter: Payer: Self-pay | Admitting: Family Medicine

## 2016-10-26 VITALS — BP 118/80 | HR 60 | Ht 65.0 in | Wt 180.0 lb

## 2016-10-26 DIAGNOSIS — I2510 Atherosclerotic heart disease of native coronary artery without angina pectoris: Secondary | ICD-10-CM | POA: Diagnosis not present

## 2016-10-26 DIAGNOSIS — Z951 Presence of aortocoronary bypass graft: Secondary | ICD-10-CM | POA: Diagnosis not present

## 2016-10-26 DIAGNOSIS — E7800 Pure hypercholesterolemia, unspecified: Secondary | ICD-10-CM

## 2016-10-26 DIAGNOSIS — I10 Essential (primary) hypertension: Secondary | ICD-10-CM | POA: Diagnosis not present

## 2016-10-26 DIAGNOSIS — Z8679 Personal history of other diseases of the circulatory system: Secondary | ICD-10-CM

## 2016-10-26 DIAGNOSIS — Z9889 Other specified postprocedural states: Secondary | ICD-10-CM

## 2016-10-26 DIAGNOSIS — I7103 Dissection of thoracoabdominal aorta: Secondary | ICD-10-CM

## 2016-10-26 NOTE — Patient Instructions (Signed)

## 2016-10-26 NOTE — Progress Notes (Signed)
Cardiology Office Note  Date:  10/26/2016   ID:  Rachel Torres, DOB August 31, 1956, MRN [id number]  PCP:  Loura Pardon, MD   Chief Complaint  Patient presents with  . other    6 month follow up. Meds reviewed by the pt. verbally. "doing well."    HPI:  Rachel Torres is a very pleasant 60 year-old woman who suffered an aorta type A dissection in September 2011 who was rushed to [facility] and underwent aortic valve replacement, bypass of the right coronary artery with venous donor site from her right thigh, aortic grafting for a extensive dissection of the  ascending aorta, residual descending aortic dissection extending into the iliac artery, with chronic back pain, previous  symptoms of shortness of breath, abdominal pain, hypertension and malaise.  She reports pathology of her aortic valve showed bicuspid valve She presents today for follow-up of her aortic valve replacement and CABG.  In follow-up today she reports that she is doing well, continues to lose weight intentionally. She has changed her diet, down 14 pounds from her last clinic visit Exercising without symptoms  Previously seen by our office, 30 day monitor ordered for palpitations,  This showed APCs and PVCs Heart rate was not particularly elevated on low-dose carvedilol  Sometimes has significant GERD symptoms Has script for GI cocktail  Lab work reviewed with her HBA1C 5.3  tolerating Lipitor  EKG on today's visit shows normal sinus rhythm with rate 60 bpm, T-wave abnormality in V1 through V3 No longer has follow-up at [facility]  Other past medical history reviewed  Echocardiogram May 2016 shows slightly improved cardiac function, ejection fraction 40-45%.   diagnosed with obstructive sleep apnea. She has a CPAP machine but she is not using it.  Previous evaluations in the emergency room for chest pain. Cardiac enzymes were negative , EKG unchanged .She had a CT scan dated 06/20/2013. She was transferred to [facility]  given her complicated anatomy. images were evaluated by vascular surgery it was determined there have been no significant progression of her disease  Previous evaluation at [facility] for shortness of breath and chest discomfort. She had workup for cardiac issues including echocardiogram and PET stress, CTA of the chest showing stable descending aorta repair with type B. aortic dissection with opacification of the false lumen at the level of the renal arteries via a lumbar artery.  Echocardiogram showed ejection fraction had improved to 45-50%, mild MR, TR (ejection fraction improved from 30-35% in January 2012)  Previous H/o abdominal pain, further workup showing biliary stones and sludge. s/p  laparoscopic cholecystectomy at [facility].  She takes Lasix for urinary retention.   PMH:   has a past medical history of AAA (abdominal aortic aneurysm) ([place]); Allergy; Anxiety; Asthma; CAD (coronary artery disease); Chronic combined systolic (congestive) and diastolic (congestive) heart failure; Depression; Gallbladder sludge; GERD (gastroesophageal reflux disease); History of Bicuspid Aortic Valve; repair of dissecting thoracic aortic aneurysm, Stanford type A; Hyperlipidemia; Hypertensive heart disease; Marfan syndrome; Migraine; Obstructive sleep apnea; Osteoporosis; and RA (rheumatoid arthritis) ([place]).  PSH:    Past Surgical History:  Procedure Laterality Date  . ABDOMINAL AORTIC ANEURYSM REPAIR    . CARDIAC CATHETERIZATION  2001  . [place]  . CHOLECYSTECTOMY    . FRACTURE SURGERY     shoulder replacement  . ORIF DISTAL RADIUS FRACTURE      Current Outpatient Prescriptions  Medication Sig Dispense Refill  . albuterol (PROVENTIL HFA;VENTOLIN HFA) 108 (90 Base) MCG/ACT inhaler Inhale  2 puffs into the lungs every 4 (four) hours as needed for wheezing or shortness of breath.    . ALPRAZolam (XANAX) 0.5 MG tablet Take 1 tablet (0.5 mg total) by mouth daily as needed for anxiety. 30  tablet 0  . aspirin EC 81 MG tablet Take 81 mg by mouth at bedtime.    Marland Kitchen atorvastatin (LIPITOR) 20 MG tablet Take 1 tablet (20 mg total) by mouth daily. 90 tablet 1  . benzonatate (TESSALON) 100 MG capsule Take 100 mg by mouth 3 (three) times daily as needed for cough.    . carvedilol (COREG) 6.25 MG tablet Take 1 tablet (6.25 mg total) by mouth 2 (two) times daily with a meal. 180 tablet 1  . cholecalciferol (VITAMIN D) 1000 units tablet Take 2,000 Units by mouth daily.    . cyclobenzaprine (FLEXERIL) 10 MG tablet Take 0.5-1 tablets (5-10 mg total) by mouth 3 (three) times daily as needed. For headache 30 tablet 1  . furosemide (LASIX) 20 MG tablet Take 1 tablet (20 mg total) by mouth daily. 90 tablet 1  . lisinopril (PRINIVIL,ZESTRIL) 5 MG tablet TAKE 1/2 TABLET (2.5 MG TOTAL) BY MOUTH DAILY. 45 tablet 1  . magnesium gluconate (MAGONATE) 500 MG tablet Take 500 mg by mouth at bedtime.    . Melatonin 3 MG CAPS Take 1 capsule by mouth at bedtime as needed.    . Misc Natural Products (TART CHERRY ADVANCED) CAPS Take 1 capsule by mouth daily.    . naproxen sodium (ANAPROX) 220 MG tablet Take 440 mg by mouth 2 (two) times daily as needed (for headaches/pain).    . nitroGLYCERIN (NITROSTAT) 0.4 MG SL tablet Place 1 tablet (0.4 mg total) under the tongue every 5 (five) minutes as needed for chest pain. 25 tablet 3  . omeprazole (PRILOSEC) 40 MG capsule Take 1 capsule by mouth daily.    Marland Kitchen oxyCODONE-acetaminophen (PERCOCET/ROXICET) 5-325 MG tablet Take 2 tablets by mouth every 8 (eight) hours as needed for severe pain.    Marland Kitchen Potassium Aminobenzoate 500 MG TABS Take 500 mg by mouth.    . promethazine (PHENERGAN) 25 MG tablet Take 0.5 tablets (12.5 mg total) by mouth every 8 (eight) hours as needed for nausea. 30 tablet 1  . venlafaxine XR (EFFEXOR-XR) 75 MG 24 hr capsule Take 75 mg by mouth daily with breakfast.     No current facility-administered medications for this visit.      Allergies:    Alendronate sodium; Ginzing [ginseng]; Hydrocod polst-cpm polst er; Hydrocodone-acetaminophen; Oxycodone; Peanut-containing drug products; Sumatriptan succinate; Hydrocodone-guaifenesin; and Prednisone   Social History:  The patient  reports that she has never smoked. She has never used smokeless tobacco. She reports that she drinks alcohol. She reports that she does not use drugs.   Family History:   family history includes Aneurysm in her paternal grandmother; Arthritis in her paternal grandfather, paternal grandmother, sister, and sister; Colon cancer in her maternal grandmother; Depression in her mother; Diabetes in her paternal grandmother and sister; Hypertension in her mother; Irritable bowel syndrome in her mother; Lymphoma in her maternal grandmother; Nephrolithiasis in her sister; Osteoporosis in her mother and sister; Stroke in her maternal grandmother and mother.    Review of Systems: Review of Systems  Constitutional: Negative.   Respiratory: Negative.   Cardiovascular: Negative.   Gastrointestinal: Negative.   Musculoskeletal: Negative.   Neurological: Negative.   Psychiatric/Behavioral: Negative.   All other systems reviewed and are negative.    PHYSICAL EXAM: VS:  BP 118/80 (BP Location: Left Arm, Patient Position: Sitting, Cuff Size: Normal)   Pulse 60   Ht 5\' 5"  (1.651 m)   Wt 180 lb (81.6 kg)   BMI 29.95 kg/m  , BMI Body mass index is 29.95 kg/m. GEN: Well nourished, well developed, in no acute distress  HEENT: normal  Neck: no JVD, carotid bruits, or masses Cardiac: RRR; no murmurs, rubs, or gallops,no edema  Respiratory:  clear to auscultation bilaterally, normal work of breathing GI: soft, nontender, nondistended, + BS MS: no deformity or atrophy  Skin: warm and dry, no rash Neuro:  Strength and sensation are intact Psych: euthymic mood, full affect    Recent Labs: 07/12/2016: TSH 0.77 08/07/2016: Hemoglobin 13.7; Platelets 228 09/02/2016: BUN 20;  Creatinine, Ser 0.82; Magnesium 2.1; Potassium 4.1; Sodium 139    Lipid Panel Lab Results  Component Value Date   CHOL 144 10/27/2015   HDL 46 10/27/2015   LDLCALC 81 10/27/2015   TRIG 87 10/27/2015      Wt Readings from Last 3 Encounters:  10/26/16 180 lb (81.6 kg)  10/06/16 181 lb (82.1 kg)  09/27/16 180 lb (81.6 kg)       ASSESSMENT AND PLAN:  Pure hypercholesterolemia Cholesterol is at goal on the current lipid regimen. No changes to the medications were made.  Essential hypertension - Plan: EKG 12-Lead Blood pressure is well controlled on today's visit. No changes made to the medications.  CORONARY ARTERY BYPASS GRAFT, HX OF - Plan: EKG 12-Lead  Dissection of thoracoabdominal aorta (HCC) Discussed previous studies with her. She does not  have follow-up at [facility], previous surgeon at [facility] left the hospital. Requesting new vascular surgery follow-up She would like to go to [facility]  Coronary artery disease involving native coronary artery of native heart without angina pectoris - Plan: EKG 12-Lead Currently with no symptoms of angina. No further workup at this time. Continue current medication regimen.  H/O dissecting abdominal aortic aneurysm repair - Plan: EKG 12-Lead Previous history discussed with her in detail   Total encounter time more than 25 minutes  Greater than 50% was spent in counseling and coordination of care with the patient   Disposition:   F/U  6 months   Orders Placed This Encounter  Procedures  . EKG 12-Lead     Signed, Esmond Plants, M.D., Ph.D. 10/26/2016  [place], [place]

## 2016-10-28 ENCOUNTER — Ambulatory Visit: Payer: TRICARE For Life (TFL) | Admitting: Psychology

## 2016-11-03 ENCOUNTER — Ambulatory Visit (INDEPENDENT_AMBULATORY_CARE_PROVIDER_SITE_OTHER): Payer: Commercial Managed Care - HMO | Admitting: Psychology

## 2016-11-03 DIAGNOSIS — F4323 Adjustment disorder with mixed anxiety and depressed mood: Secondary | ICD-10-CM | POA: Diagnosis not present

## 2016-11-08 DIAGNOSIS — H2513 Age-related nuclear cataract, bilateral: Secondary | ICD-10-CM | POA: Diagnosis not present

## 2016-11-08 DIAGNOSIS — H16403 Unspecified corneal neovascularization, bilateral: Secondary | ICD-10-CM | POA: Diagnosis not present

## 2016-11-08 DIAGNOSIS — H04123 Dry eye syndrome of bilateral lacrimal glands: Secondary | ICD-10-CM | POA: Diagnosis not present

## 2016-11-17 ENCOUNTER — Encounter: Payer: Self-pay | Admitting: Primary Care

## 2016-11-17 ENCOUNTER — Ambulatory Visit (INDEPENDENT_AMBULATORY_CARE_PROVIDER_SITE_OTHER): Payer: Commercial Managed Care - HMO | Admitting: Primary Care

## 2016-11-17 ENCOUNTER — Ambulatory Visit (INDEPENDENT_AMBULATORY_CARE_PROVIDER_SITE_OTHER): Payer: Commercial Managed Care - HMO | Admitting: Psychology

## 2016-11-17 VITALS — BP 136/84 | HR 67 | Temp 97.7°F | Ht 65.0 in | Wt 185.0 lb

## 2016-11-17 DIAGNOSIS — F4323 Adjustment disorder with mixed anxiety and depressed mood: Secondary | ICD-10-CM | POA: Diagnosis not present

## 2016-11-17 DIAGNOSIS — B029 Zoster without complications: Secondary | ICD-10-CM | POA: Diagnosis not present

## 2016-11-17 MED ORDER — VALACYCLOVIR HCL 1 G PO TABS: 1000.0000 mg | ORAL_TABLET | Freq: Three times a day (TID) | ORAL | 0 refills | Status: DC

## 2016-11-17 NOTE — Patient Instructions (Signed)
Your rash is representative of shingles.   Start Valtrex tablets. Take 1 tablet by mouth three times daily for 7 days.  Continue to apply the Cortisone cream as discussed.  Please call me if no improvement by Monday next week.  It was a pleasure meeting you!   Shingles Shingles, which is also known as herpes zoster, is an infection that causes a painful skin rash and fluid-filled blisters. Shingles is not related to genital herpes, which is a sexually transmitted infection. Shingles only develops in people who:  Have had chickenpox.  Have received the chickenpox vaccine. (This is rare.) What are the causes? Shingles is caused by varicella-zoster virus (VZV). This is the same virus that causes chickenpox. After exposure to VZV, the virus stays in the body in an inactive (dormant) state. Shingles develops if the virus reactivates. This can happen many years after the initial exposure to VZV. It is not known what causes this virus to reactivate. What increases the risk? People who have had chickenpox or received the chickenpox vaccine are at risk for shingles. Infection is more common in people who:  Are older than age 60.  Have a weakened defense (immune) system, such as those with HIV, AIDS, or cancer.  Are taking medicines that weaken the immune system, such as transplant medicines.  Are under great stress. What are the signs or symptoms? Early symptoms of this condition include itching, tingling, and pain in an area on your skin. Pain may be described as burning, stabbing, or throbbing. A few days or weeks after symptoms start, a painful red rash appears, usually on one side of the body in a bandlike or beltlike pattern. The rash eventually turns into fluid-filled blisters that break open, scab over, and dry up in about 2-3 weeks. At any time during the infection, you may also develop:  A fever.  Chills.  A headache.  An upset stomach. How is this diagnosed? This condition  is diagnosed with a skin exam. Sometimes, skin or fluid samples are taken from the blisters before a diagnosis is made. These samples are examined under a microscope or sent to a lab for testing. How is this treated? There is no specific cure for this condition. Your health care provider will probably prescribe medicines to help you manage pain, recover more quickly, and avoid long-term problems. Medicines may include:  Antiviral drugs.  Anti-inflammatory drugs.  Pain medicines. If the area involved is on your face, you may be referred to a specialist, such as an eye doctor (ophthalmologist) or an ear, nose, and throat (ENT) doctor to help you avoid eye problems, chronic pain, or disability. Follow these instructions at home: Medicines  Take medicines only as directed by your health care provider.  Apply an anti-itch or numbing cream to the affected area as directed by your health care provider. Blister and Rash Care  Take a cool bath or apply cool compresses to the area of the rash or blisters as directed by your health care provider. This may help with pain and itching.  Keep your rash covered with a loose bandage (dressing). Wear loose-fitting clothing to help ease the pain of material rubbing against the rash.  Keep your rash and blisters clean with mild soap and cool water or as directed by your health care provider.  Check your rash every day for signs of infection. These include redness, swelling, and pain that lasts or increases.  Do not pick your blisters.  Do not scratch your rash.  General instructions  Rest as directed by your health care provider.  Keep all follow-up visits as directed by your health care provider. This is important.  Until your blisters scab over, your infection can cause chickenpox in people who have never had it or been vaccinated against it. To prevent this from happening, avoid contact with other people, especially:  Babies.  Pregnant  women.  Children who have eczema.  Elderly people who have transplants.  People who have chronic illnesses, such as leukemia or AIDS. Contact a health care provider if:  Your pain is not relieved with prescribed medicines.  Your pain does not get better after the rash heals.  Your rash looks infected. Signs of infection include redness, swelling, and pain that lasts or increases. Get help right away if:  The rash is on your face or nose.  You have facial pain, pain around your eye area, or loss of feeling on one side of your face.  You have ear pain or you have ringing in your ear.  You have loss of taste.  Your condition gets worse. This information is not intended to replace advice given to you by your health care provider. Make sure you discuss any questions you have with your health care provider. Document Released: 12/06/2005 Document Revised: 08/01/2016 Document Reviewed: 10/17/2014 Elsevier Interactive Patient Education  2017 [employer].

## 2016-11-17 NOTE — Progress Notes (Signed)
Subjective:    Patient ID: Rachel Torres, female    DOB: 08/09/1956, 60 y.o.   MRN: [id number]  HPI  Rachel Torres is a 60 year old female who presents today with a chief complaint of rash. Rachel Torres rash is located to the left mid thoracic back that she first noticed as "two spots" this past Saturday. She has been under stress with personal recently and will be seeing Rachel Torres therapist today. She's been applying 2% cortisone cream and taken benadryl without improvement in size. The rash as grown since Saturday and is uncomfortable and itchy. She was eating outside Saturday night, but was not in the woods/brush. She denies fevers, chills, changes in soaps/detergents/food/medications/etc.   Review of Systems  Skin: Positive for rash.  Neurological: Negative for numbness.  Psychiatric/Behavioral:       Increased stress       Past Medical History:  Diagnosis Date  . AAA (abdominal aortic aneurysm) ([place])    a. Chronic w/o evidence of aneurysmal dil on CTA 01/2016.  Marland Kitchen Allergy   . Anxiety   . Asthma   . CAD (coronary artery disease)    a. 08/2010 s/p CABG x 1 (VG->RCA) @ time of Ao dissection repair; b. 01/2016 Lexiscan MV: mid antsept/apical defect w/ ? peri-infarct ischemia-->likely attenuation-->Med Rx.  . Chronic combined systolic (congestive) and diastolic (congestive) heart failure    a. 2012 EF 30-35%; b. 04/2015 EF 40-45%; c. 01/2016 Echo: Ef 55-65%, nl AoV bioprosthesis, nl RV, nl PASP.  Marland Kitchen Depression   . Gallbladder sludge   . GERD (gastroesophageal reflux disease)   . History of Bicuspid Aortic Valve    a. 08/2010 s/p AVR @ time of Ao dissection repair; b. 01/2016 Echo: Ef 55-65%, nl AoV bioprosthesis, nl RV, nl PASP.  Marland Kitchen Hx of repair of dissecting thoracic aortic aneurysm, Stanford type A    a. 08/2010 s/p repair with AVR and VG->RCA; b. 01/2016 CTA: stable appearance of Asc Thoracic Aortic graft. Opacification of flase lumen of chronic abd Ao dissection w/ retrograde flow through lumbar  arteries. No aneurysmal dil of flase lumen.  . Hyperlipidemia   . Hypertensive heart disease   . Marfan syndrome   . Migraine   . Obstructive sleep apnea   . Osteoporosis   . RA (rheumatoid arthritis) ([place])    a. Followed by Dr. [name]     Social History   Social History  . Marital status: Married    Spouse name: N/A  . Number of children: 2  . Years of education: N/A   Occupational History  . [employer]   Social History Main Topics  . Smoking status: Never Smoker  . Smokeless tobacco: Never Used  . Alcohol use 0.0 oz/week     Comment: occassional  . Drug use: No  . Sexual activity: No   Other Topics Concern  . Not on file   Social History Narrative  . No narrative on file    Past Surgical History:  Procedure Laterality Date  . ABDOMINAL AORTIC ANEURYSM REPAIR    . CARDIAC CATHETERIZATION  2001  . [place]  . CHOLECYSTECTOMY    . FRACTURE SURGERY     shoulder replacement  . ORIF DISTAL RADIUS FRACTURE      Family History  Problem Relation Age of Onset  . Hypertension Mother   . Depression Mother   . Osteoporosis Mother   . Stroke Mother   . Irritable bowel syndrome Mother   .  Arthritis Sister   . Diabetes Sister   . Nephrolithiasis Sister   . Osteoporosis Sister   . Arthritis Sister   . Stroke Maternal Grandmother   . Colon cancer Maternal Grandmother   . Lymphoma Maternal Grandmother   . Aneurysm Paternal Grandmother   . Diabetes Paternal Grandmother   . Arthritis Paternal Grandmother   . Arthritis Paternal Grandfather     Allergies  Allergen Reactions  . Alendronate Sodium Other (See Comments)    Reaction:  GI upset   . Ginzing [Ginseng] Other (See Comments)    Hyper and jitery   . Hydrocod Polst-Cpm Polst Er Itching  . Hydrocodone-Acetaminophen Itching  . Oxycodone Itching  . Peanut-Containing Drug Products Other (See Comments)    Reaction:  Migraines   . Sumatriptan Succinate     contri-indicated w/ heart  conditions  . Hydrocodone-Guaifenesin Itching  . Prednisone Rash    Current Outpatient Prescriptions on File Prior to Visit  Medication Sig Dispense Refill  . albuterol (PROVENTIL HFA;VENTOLIN HFA) 108 (90 Base) MCG/ACT inhaler Inhale 2 puffs into the lungs every 4 (four) hours as needed for wheezing or shortness of breath.    . ALPRAZolam (XANAX) 0.5 MG tablet Take 1 tablet (0.5 mg total) by mouth daily as needed for anxiety. 30 tablet 0  . aspirin EC 81 MG tablet Take 81 mg by mouth at bedtime.    Marland Kitchen atorvastatin (LIPITOR) 20 MG tablet Take 1 tablet (20 mg total) by mouth daily. 90 tablet 1  . carvedilol (COREG) 6.25 MG tablet Take 1 tablet (6.25 mg total) by mouth 2 (two) times daily with a meal. 180 tablet 1  . cholecalciferol (VITAMIN D) 1000 units tablet Take 2,000 Units by mouth daily.    . furosemide (LASIX) 20 MG tablet Take 1 tablet (20 mg total) by mouth daily. 90 tablet 1  . lisinopril (PRINIVIL,ZESTRIL) 5 MG tablet TAKE 1/2 TABLET (2.5 MG TOTAL) BY MOUTH DAILY. 45 tablet 1  . magnesium gluconate (MAGONATE) 500 MG tablet Take 500 mg by mouth at bedtime.    . Melatonin 3 MG CAPS Take 1 capsule by mouth at bedtime as needed.    . Misc Natural Products (TART CHERRY ADVANCED) CAPS Take 1 capsule by mouth daily.    Marland Kitchen omeprazole (PRILOSEC) 40 MG capsule Take 1 capsule by mouth daily.    . Potassium Aminobenzoate 500 MG TABS Take 500 mg by mouth.    . venlafaxine XR (EFFEXOR-XR) 75 MG 24 hr capsule Take 75 mg by mouth daily with breakfast.    . benzonatate (TESSALON) 100 MG capsule Take 100 mg by mouth 3 (three) times daily as needed for cough.    . cyclobenzaprine (FLEXERIL) 10 MG tablet Take 0.5-1 tablets (5-10 mg total) by mouth 3 (three) times daily as needed. For headache (Patient not taking: Reported on 11/17/2016) 30 tablet 1  . naproxen sodium (ANAPROX) 220 MG tablet Take 440 mg by mouth 2 (two) times daily as needed (for headaches/pain).    . nitroGLYCERIN (NITROSTAT) 0.4 MG SL  tablet Place 1 tablet (0.4 mg total) under the tongue every 5 (five) minutes as needed for chest pain. (Patient not taking: Reported on 11/17/2016) 25 tablet 3  . oxyCODONE-acetaminophen (PERCOCET/ROXICET) 5-325 MG tablet Take 2 tablets by mouth every 8 (eight) hours as needed for severe pain.    . promethazine (PHENERGAN) 25 MG tablet Take 0.5 tablets (12.5 mg total) by mouth every 8 (eight) hours as needed for nausea. (Patient not taking: Reported  on 11/17/2016) 30 tablet 1   No current facility-administered medications on file prior to visit.     BP 136/84   Pulse 67   Temp 97.7 F (36.5 C) (Oral)   Ht 5\' 5"  (1.651 m)   Wt 185 lb (83.9 kg)   SpO2 98%   BMI 30.79 kg/m    Objective:   Physical Exam  Constitutional: She appears well-nourished.  Cardiovascular: Normal rate and regular rhythm.   Pulmonary/Chest: Effort normal and breath sounds normal.  Skin: Skin is warm and dry.  Moderate rash, linear to left mid back. Following dermatone. Mildly tender. No open vesicles.          Assessment & Plan:  Herpes Zoster:  Rash since 4 days. No changes in lifestyle. Increased stress. Exam today representative of Herpes Zoster, no open vesicles.  Rx for Valtrex TID sent to pharmacy. Continue cortisone cream PRN. Return precautions provided.  Sheral Flow, NP

## 2016-11-17 NOTE — Progress Notes (Signed)
Pre visit review using our clinic review tool, if applicable. No additional management support is needed unless otherwise documented below in the visit note. 

## 2016-11-18 ENCOUNTER — Telehealth: Payer: Self-pay | Admitting: Family Medicine

## 2016-11-18 DIAGNOSIS — Z0000 Encounter for general adult medical examination without abnormal findings: Secondary | ICD-10-CM

## 2016-11-18 DIAGNOSIS — R739 Hyperglycemia, unspecified: Secondary | ICD-10-CM

## 2016-11-18 NOTE — Telephone Encounter (Signed)
-----   Message from Ellamae Sia sent at 11/17/2016 10:26 AM EST ----- Regarding: Lab orders for Tuesday, 12.5.17 Patient is scheduled for CPX labs, please order future labs, Thanks , Karna Christmas

## 2016-11-23 ENCOUNTER — Other Ambulatory Visit (INDEPENDENT_AMBULATORY_CARE_PROVIDER_SITE_OTHER): Payer: Commercial Managed Care - HMO

## 2016-11-23 DIAGNOSIS — Z0000 Encounter for general adult medical examination without abnormal findings: Secondary | ICD-10-CM

## 2016-11-23 DIAGNOSIS — R739 Hyperglycemia, unspecified: Secondary | ICD-10-CM | POA: Diagnosis not present

## 2016-11-23 LAB — COMPREHENSIVE METABOLIC PANEL
ALT: 22 U/L (ref 0–35)
AST: 19 U/L (ref 0–37)
Albumin: 4.2 g/dL (ref 3.5–5.2)
Alkaline Phosphatase: 70 U/L (ref 39–117)
BUN: 16 mg/dL (ref 6–23)
CO2: 28 mEq/L (ref 19–32)
Calcium: 9.4 mg/dL (ref 8.4–10.5)
Chloride: 105 mEq/L (ref 96–112)
Creatinine, Ser: 0.9 mg/dL (ref 0.40–1.20)
GFR: 68.09 mL/min (ref >60.00)
Glucose, Bld: 112 mg/dL — ABNORMAL HIGH (ref 70–99)
Potassium: 4.7 mEq/L (ref 3.5–5.1)
Sodium: 140 mEq/L (ref 135–145)
Total Bilirubin: 0.5 mg/dL (ref 0.2–1.2)
Total Protein: 7.3 g/dL (ref 6.0–8.3)

## 2016-11-23 LAB — CBC WITH DIFFERENTIAL/PLATELET
Basophils Absolute: 0 10*3/uL (ref 0.0–0.1)
Basophils Relative: 0.2 % (ref 0.0–3.0)
Eosinophils Absolute: 0.1 10*3/uL (ref 0.0–0.7)
Eosinophils Relative: 3 % (ref 0.0–5.0)
HCT: 39 % (ref 36.0–46.0)
Hemoglobin: 13.3 g/dL (ref 12.0–15.0)
Lymphocytes Relative: 30.4 % (ref 12.0–46.0)
Lymphs Abs: 1.4 10*3/uL (ref 0.7–4.0)
MCHC: 34.1 g/dL (ref 30.0–36.0)
MCV: 95.6 fl (ref 78.0–100.0)
Monocytes Absolute: 0.4 10*3/uL (ref 0.1–1.0)
Monocytes Relative: 9.2 % (ref 3.0–12.0)
Neutro Abs: 2.7 10*3/uL (ref 1.4–7.7)
Neutrophils Relative %: 57.2 % (ref 43.0–77.0)
Platelets: 222 10*3/uL (ref 150.0–400.0)
RBC: 4.08 Mil/uL (ref 3.87–5.11)
RDW: 14 % (ref 11.5–15.5)
WBC: 4.7 10*3/uL (ref 4.0–10.5)

## 2016-11-23 LAB — LIPID PANEL
Cholesterol: 132 mg/dL (ref 0–200)
HDL: 45.8 mg/dL (ref >39.00)
LDL Cholesterol: 69 mg/dL (ref 0–99)
NonHDL: 86.6
Total CHOL/HDL Ratio: 3
Triglycerides: 86 mg/dL (ref 0.0–149.0)
VLDL: 17.2 mg/dL (ref 0.0–40.0)

## 2016-11-23 LAB — HEMOGLOBIN A1C: Hgb A1c MFr Bld: 5.5 % (ref 4.6–6.5)

## 2016-11-23 LAB — TSH: TSH: 0.9 u[IU]/mL (ref 0.35–4.50)

## 2016-11-29 ENCOUNTER — Encounter: Payer: Self-pay | Admitting: Family Medicine

## 2016-11-29 ENCOUNTER — Other Ambulatory Visit (HOSPITAL_COMMUNITY)
Admission: RE | Admit: 2016-11-29 | Discharge: 2016-11-29 | Disposition: A | Discharge status: Home / Self Care | Payer: Commercial Managed Care - HMO | Source: Ambulatory Visit | Attending: Family Medicine | Admitting: Family Medicine

## 2016-11-29 ENCOUNTER — Ambulatory Visit (INDEPENDENT_AMBULATORY_CARE_PROVIDER_SITE_OTHER): Payer: Commercial Managed Care - HMO | Admitting: Family Medicine

## 2016-11-29 VITALS — BP 128/78 | HR 70 | Temp 98.2°F | Ht 65.0 in | Wt 176.8 lb

## 2016-11-29 DIAGNOSIS — Z8679 Personal history of other diseases of the circulatory system: Secondary | ICD-10-CM

## 2016-11-29 DIAGNOSIS — Z1151 Encounter for screening for human papillomavirus (HPV): Secondary | ICD-10-CM | POA: Diagnosis not present

## 2016-11-29 DIAGNOSIS — Z1231 Encounter for screening mammogram for malignant neoplasm of breast: Secondary | ICD-10-CM

## 2016-11-29 DIAGNOSIS — R87810 Cervical high risk human papillomavirus (HPV) DNA test positive: Secondary | ICD-10-CM | POA: Insufficient documentation

## 2016-11-29 DIAGNOSIS — I10 Essential (primary) hypertension: Secondary | ICD-10-CM | POA: Diagnosis not present

## 2016-11-29 DIAGNOSIS — Z0000 Encounter for general adult medical examination without abnormal findings: Secondary | ICD-10-CM | POA: Diagnosis not present

## 2016-11-29 DIAGNOSIS — E2839 Other primary ovarian failure: Secondary | ICD-10-CM | POA: Insufficient documentation

## 2016-11-29 DIAGNOSIS — Z01419 Encounter for gynecological examination (general) (routine) without abnormal findings: Secondary | ICD-10-CM | POA: Insufficient documentation

## 2016-11-29 DIAGNOSIS — E7800 Pure hypercholesterolemia, unspecified: Secondary | ICD-10-CM | POA: Diagnosis not present

## 2016-11-29 DIAGNOSIS — M810 Age-related osteoporosis without current pathological fracture: Secondary | ICD-10-CM

## 2016-11-29 DIAGNOSIS — E66811 Obesity, class 1: Secondary | ICD-10-CM

## 2016-11-29 DIAGNOSIS — Z9889 Other specified postprocedural states: Secondary | ICD-10-CM

## 2016-11-29 DIAGNOSIS — E669 Obesity, unspecified: Secondary | ICD-10-CM

## 2016-11-29 DIAGNOSIS — R739 Hyperglycemia, unspecified: Secondary | ICD-10-CM

## 2016-11-29 MED ORDER — ALPRAZOLAM 0.5 MG PO TABS: 0.5000 mg | ORAL_TABLET | Freq: Every day | ORAL | 0 refills | Status: DC | PRN

## 2016-11-29 NOTE — Assessment & Plan Note (Signed)
Scheduled annual screening mammogram Nl breast exam today  Encouraged monthly self exams   

## 2016-11-29 NOTE — Progress Notes (Signed)
Subjective:    Patient ID: Rachel Torres, female    DOB: May 01, 1956, 60 y.o.   MRN: [id number]  HPI Here for health maintenance exam and to review chronic medical problems   Planning a trip to [place] and [place]    Wt Readings from Last 3 Encounters:  11/29/16 176 lb 12 oz (80.2 kg)  11/17/16 185 lb (83.9 kg)  10/26/16 180 lb (81.6 kg)  lost some weight - happy about that  Less carbs  Moved - a lot of exercise during the move - making a plan to start something new  bmi is 29.4  Was seen and tx for shingles in Nov Caught it early -small area and healed fairly well    Hep C/ HIV screen  Not at risk / declines   Colonoscopy 3/03-no polyps , per pt had another one 01/19/13 at [facility]-per pt neg with a 10 year recall    Pap -had last year - [facility] with pos HPV 8/16- so needs one today    Mammogram 2/14-normal-has not had one  Wants to go to [facility]  Self breast exam - no lumps   Tetanus shot 1/10  Flu shot 8/17  bp is stable today  No cp or palpitations or headaches or edema  No side effects to medicines  BP Readings from Last 3 Encounters:  11/29/16 128/78  11/17/16 136/84  10/26/16 118/80     Cardiac status -no new changes  She has occ CP and her stomach cocktail  Stress induced for the most part  occ takes xanax and this helps her sleep   Flexeril/ phenergan for migraines prn   Hx of OP-overdue for dexa (2010) Ca and D- just takes vit D  Falls-none  Fractures -none recently (remote hx of fracture)   Hx of hyperlipidemia Lab Results  Component Value Date   CHOL 132 11/23/2016   CHOL 144 10/27/2015   CHOL 181 02/04/2014   Lab Results  Component Value Date   HDL 45.80 11/23/2016   HDL 46 10/27/2015   HDL 47 02/04/2014   Lab Results  Component Value Date   LDLCALC 69 11/23/2016   LDLCALC 81 10/27/2015   LDLCALC 107 (H) 02/04/2014   Lab Results  Component Value Date   TRIG 86.0 11/23/2016   TRIG 87 10/27/2015   TRIG 137 02/04/2014   Lab  Results  Component Value Date   CHOLHDL 3 11/23/2016   CHOLHDL 3.1 10/27/2015   CHOLHDL 3.9 02/04/2014   Lab Results  Component Value Date   LDLDIRECT 189.4 10/13/2011   LDLDIRECT 163.3 05/05/2009   LDLDIRECT 146.4 01/09/2009   on crestor and diet  Better diet recently   Hx of hyperglycemia  Lab Results  Component Value Date   HGBA1C 5.5 11/23/2016   Needs ref to vascular surgeon here (wants to change from [place])   Lab on 11/23/2016  Component Date Value Ref Range Status  . WBC 11/23/2016 4.7  4.0 - 10.5 K/uL Final  . RBC 11/23/2016 4.08  3.87 - 5.11 Mil/uL Final  . Hemoglobin 11/23/2016 13.3  12.0 - 15.0 g/dL Final  . HCT 11/23/2016 39.0  36.0 - 46.0 % Final  . MCV 11/23/2016 95.6  78.0 - 100.0 fl Final  . MCHC 11/23/2016 34.1  30.0 - 36.0 g/dL Final  . RDW 11/23/2016 14.0  11.5 - 15.5 % Final  . Platelets 11/23/2016 222.0  150.0 - 400.0 K/uL Final  . Neutrophils Relative % 11/23/2016 57.2  43.0 - 77.0 % Final  . Lymphocytes Relative 11/23/2016 30.4  12.0 - 46.0 % Final  . Monocytes Relative 11/23/2016 9.2  3.0 - 12.0 % Final  . Eosinophils Relative 11/23/2016 3.0  0.0 - 5.0 % Final  . Basophils Relative 11/23/2016 0.2  0.0 - 3.0 % Final  . Neutro Abs 11/23/2016 2.7  1.4 - 7.7 K/uL Final  . Lymphs Abs 11/23/2016 1.4  0.7 - 4.0 K/uL Final  . Monocytes Absolute 11/23/2016 0.4  0.1 - 1.0 K/uL Final  . Eosinophils Absolute 11/23/2016 0.1  0.0 - 0.7 K/uL Final  . Basophils Absolute 11/23/2016 0.0  0.0 - 0.1 K/uL Final  . Sodium 11/23/2016 140  135 - 145 mEq/L Final  . Potassium 11/23/2016 4.7  3.5 - 5.1 mEq/L Final  . Chloride 11/23/2016 105  96 - 112 mEq/L Final  . CO2 11/23/2016 28  19 - 32 mEq/L Final  . Glucose, Bld 11/23/2016 112* 70 - 99 mg/dL Final  . BUN 11/23/2016 16  6 - 23 mg/dL Final  . Creatinine, Ser 11/23/2016 0.90  0.40 - 1.20 mg/dL Final  . Total Bilirubin 11/23/2016 0.5  0.2 - 1.2 mg/dL Final  . Alkaline Phosphatase 11/23/2016 70  39 - 117 U/L  Final  . AST 11/23/2016 19  0 - 37 U/L Final  . ALT 11/23/2016 22  0 - 35 U/L Final  . Total Protein 11/23/2016 7.3  6.0 - 8.3 g/dL Final  . Albumin 11/23/2016 4.2  3.5 - 5.2 g/dL Final  . Calcium 11/23/2016 9.4  8.4 - 10.5 mg/dL Final  . GFR 11/23/2016 68.09  >60.00 mL/min Final  . Hgb A1c MFr Bld 11/23/2016 5.5  4.6 - 6.5 % Final  . Cholesterol 11/23/2016 132  0 - 200 mg/dL Final  . Triglycerides 11/23/2016 86.0  0.0 - 149.0 mg/dL Final  . HDL 11/23/2016 45.80  >39.00 mg/dL Final  . VLDL 11/23/2016 17.2  0.0 - 40.0 mg/dL Final  . LDL Cholesterol 11/23/2016 69  0 - 99 mg/dL Final  . Total CHOL/HDL Ratio 11/23/2016 3   Final  . NonHDL 11/23/2016 86.60   Final  . TSH 11/23/2016 0.90  0.35 - 4.50 uIU/mL Final     Review of Systems    Review of Systems  Constitutional: Negative for fever, appetite change, fatigue and unexpected weight change.  Eyes: Negative for pain and visual disturbance.  Respiratory: Negative for cough and shortness of breath.   Cardiovascular: Negative for cp or palpitations    Gastrointestinal: Negative for nausea, diarrhea and constipation.  Genitourinary: Negative for urgency and frequency.  Skin: Negative for pallor or rash  pos for recent shingles that is resolved but with some scarring  Neurological: Negative for weakness, light-headedness, numbness and headaches.  Hematological: Negative for adenopathy. Does not bruise/bleed easily.  Psychiatric/Behavioral: Negative for dysphoric mood. The patient is not nervous/anxious.  (mood has been stable lately)     Objective:   Physical Exam  Constitutional: She appears well-developed and well-nourished. No distress.  obese and well appearing   HENT:  Head: Normocephalic and atraumatic.  Right Ear: External ear normal.  Left Ear: External ear normal.  Mouth/Throat: Oropharynx is clear and moist.  Eyes: Conjunctivae and EOM are normal. Pupils are equal, round, and reactive to light. No scleral icterus.  Neck:  Normal range of motion. Neck supple. No JVD present. Carotid bruit is not present. No thyromegaly present.  Cardiovascular: Normal rate, regular rhythm and intact distal pulses.  Exam reveals  no gallop.   Murmur heard. Click heard from valve replacement  Pulmonary/Chest: Effort normal and breath sounds normal. No respiratory distress. She has no wheezes. She has no rales. She exhibits no tenderness.   No crackles  Abdominal: Soft. Bowel sounds are normal. She exhibits no distension, no abdominal bruit and no mass. There is no tenderness.  Baseline abd and thoracic scars  Genitourinary: No breast swelling, tenderness, discharge or bleeding.  Genitourinary Comments: Breast exam: No mass, nodules, thickening, tenderness, bulging, retraction, inflamation, nipple discharge or skin changes noted.  No axillary or clavicular LA.             Anus appears normal w/o hemorrhoids or masses     External genitalia : nl appearance and hair distribution/no lesions     Urethral meatus : nl size, no lesions or prolapse     Urethra: no masses, tenderness or scarring    Bladder : no masses or tenderness     Vagina: nl general appearance, no discharge or  Lesions, no significant cystocele  or rectocele mild atrophy    Cervix: no lesions/ discharge or friability    Uterus: nl size, contour, position, and mobility (not fixed) , non tender    Adnexa : no masses, tenderness, enlargement or nodularity         Musculoskeletal: Normal range of motion. She exhibits no edema or tenderness.  No kyphosis   Lymphadenopathy:    She has no cervical adenopathy.  Neurological: She is alert. She has normal reflexes. No cranial nerve deficit. She exhibits normal muscle tone. Coordination normal.  Skin: Skin is warm and dry. No rash noted. No erythema. No pallor.  Stable lentigines and nevi  Psychiatric: She has a normal mood and affect.          Assessment & Plan:   Problem List Items  Addressed This Visit      Cardiovascular and Mediastinum   Essential hypertension    bp in fair control at this time  BP Readings from Last 1 Encounters:  11/29/16 128/78   No changes needed Disc lifstyle change with low sodium diet and exercise  Good control with current medications Followed by cardiology        Musculoskeletal and Integument   Osteoporosis    Ref done for dexa No falls or fractures Disc need for calcium/ vitamin D/ wt bearing exercise and bone density test every 2 y to monitor Disc safety/ fracture risk in detail   She has been on PPIs in the past  Last fracture was remote        Other   Screening mammogram, encounter for    Scheduled annual screening mammogram Nl breast exam today  Encouraged monthly self exams         Relevant Orders   MM DIGITAL SCREENING BILATERAL   Routine general medical examination at a health care facility - Primary    Reviewed health habits including diet and exercise and skin cancer prevention Reviewed appropriate screening tests for age  Also reviewed health mt list, fam hx and immunization status , as well as social and family history   See HPI Labs reviewed  Ref for mammogram  Pap/gyn exam done  Updated colonoscopy Ref for dexa  Enc further wt loss and exercise as tolerated  I recommend shingles vaccine -she will check on coverage        Obesity (BMI 30.0-34.9)    Discussed how this problem influences overall health and the risks it  imposes  Reviewed plan for weight loss with lower calorie diet (via better food choices and also portion control or program like weight watchers) and exercise building up to or more than 30 minutes 5 days per week including some aerobic activity   Commended on wt loss so far with less refined sugar and carbs Enc to add exercise as tolerated       Hyperlipidemia    Disc goals for lipids and reasons to control them Rev labs with pt Rev low sat fat diet in detail Improved with  crestor and diet       Hyperglycemia    Lab Results  Component Value Date   HGBA1C 5.5 11/23/2016   Improved with better diet and wt loss  Enc her to continue       H/O dissecting abdominal aortic aneurysm repair    Pt wants to est with local neuro surgeon  Doing well  Ref done      Relevant Orders   Ambulatory referral to Vascular Surgery   Estrogen deficiency    Ref for dexa       Relevant Orders   DG Bone Density   Encounter for routine gynecological examination    Routine exam done  Last pap [facility]) was nl with pos reflex HPV (not the high risk type per pt)  Pap done today for 1 y f/u (a bit late)  No c/o      Relevant Orders   Cytology - PAP

## 2016-11-29 NOTE — Assessment & Plan Note (Signed)
bp in fair control at this time  BP Readings from Last 1 Encounters:  11/29/16 128/78   No changes needed Disc lifstyle change with low sodium diet and exercise  Good control with current medications Followed by cardiology

## 2016-11-29 NOTE — Assessment & Plan Note (Signed)
Ref for dexa 

## 2016-11-29 NOTE — Assessment & Plan Note (Signed)
Lab Results  Component Value Date   HGBA1C 5.5 11/23/2016   Improved with better diet and wt loss  Enc her to continue

## 2016-11-29 NOTE — Progress Notes (Signed)
Pre visit review using our clinic review tool, if applicable. No additional management support is needed unless otherwise documented below in the visit note. 

## 2016-11-29 NOTE — Patient Instructions (Addendum)
If you are interested in a shingles/zoster vaccine - call your insurance to check on coverage,( you should not get it within 1 month of other vaccines) , then call us for a prescription  for it to take to a pharmacy that gives the shot , or make a nurse visit to get it here depending on your coverage   Stop up front for referral to vascular office and also for mammogram and bone density   Keep up the good health habits  Exercise as tolerated - fitness is very important to general health and bone density

## 2016-11-29 NOTE — Assessment & Plan Note (Signed)
Discussed how this problem influences overall health and the risks it imposes  Reviewed plan for weight loss with lower calorie diet (via better food choices and also portion control or program like weight watchers) and exercise building up to or more than 30 minutes 5 days per week including some aerobic activity   Commended on wt loss so far with less refined sugar and carbs Enc to add exercise as tolerated

## 2016-11-29 NOTE — Assessment & Plan Note (Signed)
Disc goals for lipids and reasons to control them Rev labs with pt Rev low sat fat diet in detail Improved with crestor and diet

## 2016-11-29 NOTE — Assessment & Plan Note (Signed)
Routine exam done  Last pap [facility]) was nl with pos reflex HPV (not the high risk type per pt)  Pap done today for 1 y f/u (a bit late)  No c/o

## 2016-11-29 NOTE — Assessment & Plan Note (Signed)
Pt wants to est with local neuro surgeon  Doing well  Ref done

## 2016-11-29 NOTE — Assessment & Plan Note (Signed)
Ref done for dexa No falls or fractures Disc need for calcium/ vitamin D/ wt bearing exercise and bone density test every 2 y to monitor Disc safety/ fracture risk in detail   She has been on PPIs in the past  Last fracture was remote

## 2016-11-29 NOTE — Assessment & Plan Note (Addendum)
Reviewed health habits including diet and exercise and skin cancer prevention Reviewed appropriate screening tests for age  Also reviewed health mt list, fam hx and immunization status , as well as social and family history   See HPI Labs reviewed  Ref for mammogram  Pap/gyn exam done  Updated colonoscopy Ref for dexa  Enc further wt loss and exercise as tolerated  I recommend shingles vaccine -she will check on coverage

## 2016-12-01 ENCOUNTER — Encounter: Payer: Self-pay | Admitting: Family Medicine

## 2016-12-01 DIAGNOSIS — Z8619 Personal history of other infectious and parasitic diseases: Secondary | ICD-10-CM | POA: Insufficient documentation

## 2016-12-01 DIAGNOSIS — B977 Papillomavirus as the cause of diseases classified elsewhere: Secondary | ICD-10-CM | POA: Insufficient documentation

## 2016-12-01 LAB — CYTOLOGY - PAP
Diagnosis: NEGATIVE
HPV 16/18/45 genotyping: NEGATIVE
HPV: DETECTED — AB

## 2016-12-07 ENCOUNTER — Encounter: Payer: Self-pay | Admitting: Family Medicine

## 2016-12-08 ENCOUNTER — Ambulatory Visit (INDEPENDENT_AMBULATORY_CARE_PROVIDER_SITE_OTHER): Payer: Commercial Managed Care - HMO | Admitting: Psychology

## 2016-12-08 DIAGNOSIS — F4323 Adjustment disorder with mixed anxiety and depressed mood: Secondary | ICD-10-CM

## 2016-12-09 ENCOUNTER — Other Ambulatory Visit: Payer: Self-pay | Admitting: Vascular Surgery

## 2016-12-10 ENCOUNTER — Other Ambulatory Visit: Payer: Self-pay | Admitting: *Deleted

## 2016-12-10 DIAGNOSIS — I7102 Dissection of abdominal aorta: Secondary | ICD-10-CM

## 2016-12-10 DIAGNOSIS — Z48812 Encounter for surgical aftercare following surgery on the circulatory system: Secondary | ICD-10-CM

## 2016-12-22 ENCOUNTER — Ambulatory Visit (INDEPENDENT_AMBULATORY_CARE_PROVIDER_SITE_OTHER): Payer: Medicare HMO | Admitting: Psychology

## 2016-12-22 DIAGNOSIS — F4323 Adjustment disorder with mixed anxiety and depressed mood: Secondary | ICD-10-CM

## 2017-01-11 ENCOUNTER — Other Ambulatory Visit: Payer: Self-pay

## 2017-01-11 MED ORDER — NITROGLYCERIN 0.4 MG SL SUBL: 0.4000 mg | SUBLINGUAL_TABLET | SUBLINGUAL | 3 refills | Status: DC | PRN

## 2017-01-11 MED ORDER — ATORVASTATIN CALCIUM 20 MG PO TABS: 20.0000 mg | ORAL_TABLET | Freq: Every day | ORAL | 3 refills | Status: DC

## 2017-01-11 MED ORDER — LISINOPRIL 5 MG PO TABS: ORAL_TABLET | ORAL | 3 refills | Status: DC

## 2017-01-11 MED ORDER — FUROSEMIDE 20 MG PO TABS: 20.0000 mg | ORAL_TABLET | Freq: Every day | ORAL | 3 refills | Status: DC

## 2017-01-11 MED ORDER — CARVEDILOL 6.25 MG PO TABS: 6.2500 mg | ORAL_TABLET | Freq: Two times a day (BID) | ORAL | 3 refills | Status: DC

## 2017-01-17 ENCOUNTER — Encounter: Payer: Self-pay | Admitting: Family Medicine

## 2017-01-18 ENCOUNTER — Encounter: Payer: Self-pay | Admitting: Vascular Surgery

## 2017-01-25 ENCOUNTER — Ambulatory Visit (INDEPENDENT_AMBULATORY_CARE_PROVIDER_SITE_OTHER): Payer: Medicare HMO | Admitting: Vascular Surgery

## 2017-01-25 ENCOUNTER — Other Ambulatory Visit: Payer: Self-pay | Admitting: Vascular Surgery

## 2017-01-25 ENCOUNTER — Encounter: Payer: Self-pay | Admitting: Vascular Surgery

## 2017-01-25 ENCOUNTER — Ambulatory Visit (HOSPITAL_COMMUNITY)
Admission: RE | Admit: 2017-01-25 | Discharge: 2017-01-25 | Disposition: A | Discharge status: Home / Self Care | Payer: Medicare HMO | Source: Ambulatory Visit | Attending: Vascular Surgery | Admitting: Vascular Surgery

## 2017-01-25 VITALS — BP 117/69 | HR 62 | Temp 97.7°F | Resp 18 | Ht 65.0 in | Wt 180.0 lb

## 2017-01-25 DIAGNOSIS — I7102 Dissection of abdominal aorta: Secondary | ICD-10-CM | POA: Diagnosis not present

## 2017-01-25 DIAGNOSIS — I71019 Dissection of thoracic aorta, unspecified: Secondary | ICD-10-CM

## 2017-01-25 DIAGNOSIS — I7101 Dissection of thoracic aorta: Secondary | ICD-10-CM | POA: Diagnosis not present

## 2017-01-25 DIAGNOSIS — Z48812 Encounter for surgical aftercare following surgery on the circulatory system: Secondary | ICD-10-CM

## 2017-01-25 NOTE — Progress Notes (Signed)
[facility]  Patient name: Rachel Torres MRN: [id number] DOB: 05/05/1956 Sex: female  REASON FOR CONSULT: Follow-up of thoracic dissection.  HPI: BRAYLEI ELLENWOOD is a 61 y.o. female, who is seen today to establish follow-up. She had an acute ascending dissection in September 2011. She had emergent treatment at [facility] with acute aortic valve replacement and coronary bypass 1. I do not have the specifics of this procedure. I am able to review her CT scan at the time of her presentation in 2011. She had obvious critical illness around the time of this procedure but eventually had full recovery. She wishes to establish follow-up in [place]. She did have a CT scan in August 2017 and I have this for review as well. She denies any lower extremity claudication symptoms and no history of stroke. She does report that occasionally she has sensation that her legs are unstable. This comes about with no provocation and clears spontaneously.  Past Medical History:  Diagnosis Date  . AAA (abdominal aortic aneurysm) ([place])    a. Chronic w/o evidence of aneurysmal dil on CTA 01/2016.  Marland Kitchen Allergy   . Anxiety   . Asthma   . CAD (coronary artery disease)    a. 08/2010 s/p CABG x 1 (VG->RCA) @ time of Ao dissection repair; b. 01/2016 Lexiscan MV: mid antsept/apical defect w/ ? peri-infarct ischemia-->likely attenuation-->Med Rx.  . Chronic combined systolic (congestive) and diastolic (congestive) heart failure    a. 2012 EF 30-35%; b. 04/2015 EF 40-45%; c. 01/2016 Echo: Ef 55-65%, nl AoV bioprosthesis, nl RV, nl PASP.  Marland Kitchen Depression   . Gallbladder sludge   . GERD (gastroesophageal reflux disease)   . History of Bicuspid Aortic Valve    a. 08/2010 s/p AVR @ time of Ao dissection repair; b. 01/2016 Echo: Ef 55-65%, nl AoV bioprosthesis, nl RV, nl PASP.  Marland Kitchen Hx of repair of dissecting thoracic aortic aneurysm, Stanford type A    a. 08/2010 s/p  repair with AVR and VG->RCA; b. 01/2016 CTA: stable appearance of Asc Thoracic Aortic graft. Opacification of flase lumen of chronic abd Ao dissection w/ retrograde flow through lumbar arteries. No aneurysmal dil of flase lumen.  . Hyperlipidemia   . Hypertensive heart disease   . Marfan syndrome   . Migraine   . Obstructive sleep apnea   . Osteoporosis   . RA (rheumatoid arthritis) ([place])    a. Followed by Dr. [name]    Family History  Problem Relation Age of Onset  . Hypertension Mother   . Depression Mother   . Osteoporosis Mother   . Stroke Mother   . Irritable bowel syndrome Mother   . Arthritis Sister   . Diabetes Sister   . Nephrolithiasis Sister   . Osteoporosis Sister   . Arthritis Sister   . Stroke Maternal Grandmother   . Colon cancer Maternal Grandmother   . Lymphoma Maternal Grandmother   . Aneurysm Paternal Grandmother   . Diabetes Paternal Grandmother   . Arthritis Paternal Grandmother   . Arthritis Paternal Grandfather     SOCIAL HISTORY: Social History   Social History  . Marital status: Married    Spouse name: N/A  . Number of children: 2  . Years of education: N/A   Occupational History  . [employer]   Social History Main Topics  . Smoking status: Former Smoker    Quit date: 12/20/1978  . Smokeless tobacco: Never Used  . Alcohol use  0.0 oz/week     Comment: occassional  . Drug use: No  . Sexual activity: No   Other Topics Concern  . Not on file   Social History Narrative  . No narrative on file    Allergies  Allergen Reactions  . Alendronate Sodium Other (See Comments)    Reaction:  GI upset   . Ginzing [Ginseng] Other (See Comments)    Hyper and jitery   . Hydrocod Polst-Cpm Polst Er Itching  . Hydrocodone-Acetaminophen Itching  . Oxycodone Itching  . Peanut-Containing Drug Products Other (See Comments)    Reaction:  Migraines   . Sumatriptan Succinate     contri-indicated w/ heart conditions  . Hydrocodone-Guaifenesin  Itching  . Prednisone Rash    Current Outpatient Prescriptions  Medication Sig Dispense Refill  . albuterol (PROVENTIL HFA;VENTOLIN HFA) 108 (90 Base) MCG/ACT inhaler Inhale 2 puffs into the lungs every 4 (four) hours as needed for wheezing or shortness of breath.    . ALPRAZolam (XANAX) 0.5 MG tablet Take 1 tablet (0.5 mg total) by mouth daily as needed for anxiety. 30 tablet 0  . aspirin EC 81 MG tablet Take 81 mg by mouth at bedtime.    Marland Kitchen atorvastatin (LIPITOR) 20 MG tablet Take 1 tablet (20 mg total) by mouth daily. 90 tablet 3  . benzonatate (TESSALON) 100 MG capsule Take 100 mg by mouth 3 (three) times daily as needed for cough.    . carvedilol (COREG) 6.25 MG tablet Take 1 tablet (6.25 mg total) by mouth 2 (two) times daily with a meal. 180 tablet 3  . cholecalciferol (VITAMIN D) 1000 units tablet Take 2,000 Units by mouth daily.    . cyclobenzaprine (FLEXERIL) 10 MG tablet Take 0.5-1 tablets (5-10 mg total) by mouth 3 (three) times daily as needed. For headache 30 tablet 1  . furosemide (LASIX) 20 MG tablet Take 1 tablet (20 mg total) by mouth daily. 90 tablet 3  . lisinopril (PRINIVIL,ZESTRIL) 5 MG tablet TAKE 1/2 TABLET (2.5 MG TOTAL) BY MOUTH DAILY. 45 tablet 3  . magnesium gluconate (MAGONATE) 500 MG tablet Take 500 mg by mouth at bedtime.    . Melatonin 3 MG CAPS Take 1 capsule by mouth at bedtime as needed.    . Misc Natural Products (TART CHERRY ADVANCED) CAPS Take 1 capsule by mouth daily.    . naproxen sodium (ANAPROX) 220 MG tablet Take 440 mg by mouth 2 (two) times daily as needed (for headaches/pain).    . nitroGLYCERIN (NITROSTAT) 0.4 MG SL tablet Place 1 tablet (0.4 mg total) under the tongue every 5 (five) minutes as needed for chest pain. 25 tablet 3  . omeprazole (PRILOSEC) 40 MG capsule Take 1 capsule by mouth daily.    Marland Kitchen oxyCODONE-acetaminophen (PERCOCET/ROXICET) 5-325 MG tablet Take 2 tablets by mouth every 8 (eight) hours as needed for severe pain.    Marland Kitchen Potassium  Aminobenzoate 500 MG TABS Take 500 mg by mouth.    . promethazine (PHENERGAN) 25 MG tablet Take 0.5 tablets (12.5 mg total) by mouth every 8 (eight) hours as needed for nausea. 30 tablet 1  . venlafaxine XR (EFFEXOR-XR) 75 MG 24 hr capsule Take 75 mg by mouth daily with breakfast.     No current facility-administered medications for this visit.     REVIEW OF SYSTEMS:  [X]  denotes positive finding, [ ]  denotes negative finding Cardiac  Comments:  Chest pain or chest pressure:    Shortness of breath upon exertion: x  Short of breath when lying flat:    Irregular heart rhythm:        Vascular    Pain in calf, thigh, or hip brought on by ambulation:    Pain in feet at night that wakes you up from your sleep:     Blood clot in your veins:    Leg swelling:         Pulmonary    Oxygen at home:    Productive cough:     Wheezing:         Neurologic    Sudden weakness in arms or legs:     Sudden numbness in arms or legs:     Sudden onset of difficulty speaking or slurred speech:    Temporary loss of vision in one eye:     Problems with dizziness:         Gastrointestinal    Blood in stool:     Vomited blood:         Genitourinary    Burning when urinating:     Blood in urine:        Psychiatric    Major depression:         Hematologic    Bleeding problems:    Problems with blood clotting too easily:        Skin    Rashes or ulcers:        Constitutional    Fever or chills:      PHYSICAL EXAM: Vitals:   01/25/17 0929  BP: 117/69  Pulse: 62  Resp: 18  Temp: 97.7 F (36.5 C)  TempSrc: Oral  SpO2: 99%  Weight: 180 lb (81.6 kg)  Height: 5\' 5"  (1.651 m)    GENERAL: The patient is a well-nourished female, in no acute distress. The vital signs are documented above. CARDIOVASCULAR: Carotid arteries without bruits bilaterally. She has 2+ radial 2+ femoral and 2+ dorsalis pedis pulses bilaterally PULMONARY: There is good air exchange  ABDOMEN: Soft and non-tender.  I do not hear abdominal bruit  MUSCULOSKELETAL: There are no major deformities or cyanosis. NEUROLOGIC: No focal weakness or paresthesias are detected. SKIN: There are no ulcers or rashes noted. PSYCHIATRIC: The patient has a normal affect.  DATA:  CT scan shows postsurgical changes in her chest. This shows no evidence of dissection in her thoracic aorta. She does have evidence of chronic dissection at the level of her mesenteric vessels. She has chronic mural thrombus on the posterior aspect of her aorta with normal flow into her celiac, superior mesenteric and both renal arteries offer true lumen. The CT scan was a scan of her chest only and does not show abdomen and pelvis  MEDICAL ISSUES: I reviewed the patient's actual films with her. Also reviewed her films from 2011 with her. She has normal pedal pulses therefore would not recommend any further evaluation currently. She did undergo ultrasound of her aorta in our office today to rule out any enlargement of the aorta below the level of her CT scan from August. This shows maximal diameter of 2.5 cm. I have recommended that we see her again in 2 years with CT scan of her chest abdomen and pelvis. Explained the symptoms of acute ischemia with her she does report a meal he should this occur.   Rosetta Posner, MD FACS [facility] Tel [phone] Pager [phone]

## 2017-01-31 ENCOUNTER — Ambulatory Visit: Payer: Commercial Managed Care - HMO

## 2017-01-31 ENCOUNTER — Other Ambulatory Visit: Payer: Self-pay

## 2017-02-01 ENCOUNTER — Ambulatory Visit (INDEPENDENT_AMBULATORY_CARE_PROVIDER_SITE_OTHER): Payer: Medicare HMO | Admitting: Psychology

## 2017-02-01 DIAGNOSIS — F4323 Adjustment disorder with mixed anxiety and depressed mood: Secondary | ICD-10-CM | POA: Diagnosis not present

## 2017-02-04 NOTE — Addendum Note (Signed)
Addended by: Lianne Cure A on: 02/04/2017 11:46 AM   Modules accepted: Orders

## 2017-02-10 DIAGNOSIS — H04123 Dry eye syndrome of bilateral lacrimal glands: Secondary | ICD-10-CM | POA: Diagnosis not present

## 2017-02-10 DIAGNOSIS — H52203 Unspecified astigmatism, bilateral: Secondary | ICD-10-CM | POA: Diagnosis not present

## 2017-02-10 DIAGNOSIS — H2513 Age-related nuclear cataract, bilateral: Secondary | ICD-10-CM | POA: Diagnosis not present

## 2017-02-16 ENCOUNTER — Encounter: Payer: Self-pay | Admitting: Family Medicine

## 2017-02-16 ENCOUNTER — Ambulatory Visit (INDEPENDENT_AMBULATORY_CARE_PROVIDER_SITE_OTHER): Payer: Medicare HMO | Admitting: Family Medicine

## 2017-02-16 ENCOUNTER — Ambulatory Visit (INDEPENDENT_AMBULATORY_CARE_PROVIDER_SITE_OTHER): Payer: Medicare HMO | Admitting: Psychology

## 2017-02-16 ENCOUNTER — Ambulatory Visit (INDEPENDENT_AMBULATORY_CARE_PROVIDER_SITE_OTHER)
Admission: RE | Admit: 2017-02-16 | Discharge: 2017-02-16 | Disposition: A | Discharge status: Home / Self Care | Payer: Medicare HMO | Source: Ambulatory Visit | Attending: Family Medicine | Admitting: Family Medicine

## 2017-02-16 VITALS — BP 110/70 | HR 68 | Temp 98.6°F | Ht 65.0 in | Wt 182.5 lb

## 2017-02-16 DIAGNOSIS — M25561 Pain in right knee: Secondary | ICD-10-CM | POA: Diagnosis not present

## 2017-02-16 DIAGNOSIS — M1711 Unilateral primary osteoarthritis, right knee: Secondary | ICD-10-CM | POA: Diagnosis not present

## 2017-02-16 DIAGNOSIS — S8991XA Unspecified injury of right lower leg, initial encounter: Secondary | ICD-10-CM | POA: Diagnosis not present

## 2017-02-16 DIAGNOSIS — F4323 Adjustment disorder with mixed anxiety and depressed mood: Secondary | ICD-10-CM | POA: Diagnosis not present

## 2017-02-16 MED ORDER — METHYLPREDNISOLONE ACETATE 40 MG/ML IJ SUSP
40.0000 mg | Freq: Once | INTRAMUSCULAR | Status: AC
  Administered 2017-02-16: 40 mg via INTRAMUSCULAR

## 2017-02-16 NOTE — Progress Notes (Signed)
Pre visit review using our clinic review tool, if applicable. No additional management support is needed unless otherwise documented below in the visit note. 

## 2017-02-16 NOTE — Progress Notes (Signed)
Dr. Frederico Hamman T. Endiya Klahr, MD, [facility] Medicine [place], [address] Phone: [phone] Fax: [phone]  02/16/2017  Patient: Rachel Torres, MRN: [id number], DOB: [birth year], 61 y.o.  Primary Physician:  Loura Pardon, MD   Chief Complaint  Patient presents with  . Knee Pain    Right-Fell in October   Subjective:   Alexi C Hepler is a 61 y.o. very pleasant female patient who presents with the following:  4-5 months ago, fell. Fell directly on her knee. ? [place]. Does 90% of her driving. She has some abrasions and initially had quite a bit of swelling, and since then she has had some intermittent swelling as well as some posterior fullness in the right knee. She has felt some mechanical clicking and popping that was not there per her memory previously. She has had some prior injuries on that side without surgery.  Some swelling.   Past Medical History, Surgical History, Social History, Family History, Problem List, Medications, and Allergies have been reviewed and updated if relevant.  Patient Active Problem List   Diagnosis Date Noted  . HPV (human papilloma virus) infection 12/01/2016  . Screening mammogram, encounter for 11/29/2016  . Estrogen deficiency 11/29/2016  . Encounter for routine gynecological examination 11/29/2016  . Grade III hemorrhoids 10/06/2016  . Tachycardia 08/14/2016  . Hemorrhoids 08/13/2016  . Atypical migraine 08/03/2016  . Sweating 07/12/2016  . CAD (coronary artery disease)   . AAA (abdominal aortic aneurysm) ([place])   . Chronic combined systolic (congestive) and diastolic (congestive) heart failure   . Hyperglycemia 02/11/2016  . Generalized anxiety disorder 12/08/2015  . Panic attacks 07/28/2015  . Sensorineural hearing loss 03/27/2013  . Aortic dissection ([place]) 05/12/2012  . Obesity (BMI 30.0-34.9) 11/21/2011  . Routine general medical examination at a health care facility  09/20/2011  . S/P AVR (aortic valve replacement) 01/07/2011  . CORONARY ARTERY BYPASS GRAFT, HX OF 01/07/2011  . Arthritis 12/20/2010  . H/O dissecting abdominal aortic aneurysm repair 08/24/2010  . Major depressive disorder, single episode 08/24/2010  . IRRITABLE BOWEL SYNDROME 09/09/2009  . Obstructive sleep apnea 08/04/2009  . External hemorrhoid 06/26/2009  . Osteoporosis 01/09/2009  . Hyperlipidemia 07/26/2007  . Depression with anxiety 07/26/2007  . Essential hypertension 07/26/2007  . Allergic rhinitis 07/26/2007  . Asthma 07/26/2007  . GERD 07/26/2007  . Osteoarthritis 07/26/2007  . Ocular migraine 07/26/2007  . Mild intermittent asthma without complication [id number]    Past Medical History:  Diagnosis Date  . AAA (abdominal aortic aneurysm) ([place])    a. Chronic w/o evidence of aneurysmal dil on CTA 01/2016.  Marland Kitchen Allergy   . Anxiety   . Asthma   . CAD (coronary artery disease)    a. 08/2010 s/p CABG x 1 (VG->RCA) @ time of Ao dissection repair; b. 01/2016 Lexiscan MV: mid antsept/apical defect w/ ? peri-infarct ischemia-->likely attenuation-->Med Rx.  . Chronic combined systolic (congestive) and diastolic (congestive) heart failure    a. 2012 EF 30-35%; b. 04/2015 EF 40-45%; c. 01/2016 Echo: Ef 55-65%, nl AoV bioprosthesis, nl RV, nl PASP.  Marland Kitchen Depression   . Gallbladder sludge   . GERD (gastroesophageal reflux disease)   . History of Bicuspid Aortic Valve    a. 08/2010 s/p AVR @ time of Ao dissection repair; b. 01/2016 Echo: Ef 55-65%, nl AoV bioprosthesis, nl RV, nl PASP.  Marland Kitchen Hx of repair of dissecting thoracic aortic aneurysm, Stanford type A    a.  08/2010 s/p repair with AVR and VG->RCA; b. 01/2016 CTA: stable appearance of Asc Thoracic Aortic graft. Opacification of flase lumen of chronic abd Ao dissection w/ retrograde flow through lumbar arteries. No aneurysmal dil of flase lumen.  . Hyperlipidemia   . Hypertensive heart disease   . Marfan syndrome   . Migraine   .  Obstructive sleep apnea   . Osteoporosis   . RA (rheumatoid arthritis) ([place])    a. Followed by Dr. [name]    Past Surgical History:  Procedure Laterality Date  . ABDOMINAL AORTIC ANEURYSM REPAIR    . CARDIAC CATHETERIZATION  2001  . [place]  . CHOLECYSTECTOMY    . FRACTURE SURGERY     shoulder replacement  . ORIF DISTAL RADIUS FRACTURE      Social History   Social History  . Marital status: Married    Spouse name: N/A  . Number of children: 2  . Years of education: N/A   Occupational History  . [employer]   Social History Main Topics  . Smoking status: Former Smoker    Quit date: 12/20/1978  . Smokeless tobacco: Never Used  . Alcohol use 0.0 oz/week     Comment: occassional  . Drug use: No  . Sexual activity: No   Other Topics Concern  . Not on file   Social History Narrative  . No narrative on file    Family History  Problem Relation Age of Onset  . Hypertension Mother   . Depression Mother   . Osteoporosis Mother   . Stroke Mother   . Irritable bowel syndrome Mother   . Arthritis Sister   . Diabetes Sister   . Nephrolithiasis Sister   . Osteoporosis Sister   . Arthritis Sister   . Stroke Maternal Grandmother   . Colon cancer Maternal Grandmother   . Lymphoma Maternal Grandmother   . Aneurysm Paternal Grandmother   . Diabetes Paternal Grandmother   . Arthritis Paternal Grandmother   . Arthritis Paternal Grandfather     Allergies  Allergen Reactions  . Alendronate Sodium Other (See Comments)    Reaction:  GI upset   . Ginzing [Ginseng] Other (See Comments)    Hyper and jitery   . Hydrocod Polst-Cpm Polst Er Itching  . Hydrocodone-Acetaminophen Itching  . Oxycodone Itching  . Peanut-Containing Drug Products Other (See Comments)    Reaction:  Migraines   . Sumatriptan Succinate     contri-indicated w/ heart conditions  . Hydrocodone-Guaifenesin Itching  . Prednisone Rash    Medication list reviewed and updated in  full in [place].  GEN: No fevers, chills. Nontoxic. Primarily MSK c/o today. MSK: Detailed in the HPI GI: tolerating PO intake without difficulty Neuro: No numbness, parasthesias, or tingling associated. Otherwise the pertinent positives of the ROS are noted above.   Objective:   BP 110/70   Pulse 68   Temp 98.6 F (37 C) (Oral)   Ht 5\' 5"  (1.651 m)   Wt 182 lb 8 oz (82.8 kg)   BMI 30.37 kg/m    GEN: WDWN, NAD, Non-toxic, Alert & Oriented x 3 HEENT: Atraumatic, Normocephalic.  Ears and Nose: No external deformity. EXTR: No clubbing/cyanosis/edema NEURO: Normal gait.  PSYCH: Normally interactive. Conversant. Not depressed or anxious appearing.  Calm demeanor.    Right knee: Full extension. Flexion to 135. Stable to varus and valgus stress. Anterior cruciate ligament and PCL are stable. Notable pain at the medial joint line,  greater than the lateral joint line. McMurray's causes pain. Flexion pinch causes pain.  Radiology: Dg Knee Ap/lat W/sunrise Right  Result Date: 02/16/2017 CLINICAL DATA:  Right knee trauma 4 months ago with persistent pain. EXAM: RIGHT KNEE 3 VIEWS COMPARISON:  None in PACs FINDINGS: The bones are subjectively adequately mineralized. There is mild narrowing of the medial joint compartment. The lateral and patellofemoral joint spaces are well maintained. There is beaking of the tibial spines. Small spur arises from the superior articular margin of the patella. There is no acute fracture nor dislocation. IMPRESSION: Mild degenerative change centered on the medial joint compartment. No acute bony abnormality. Electronically Signed   By: David  Martinique M.D.   On: 02/16/2017 12:46    The radiological images were independently reviewed by myself in the office and results were reviewed with the patient. My independent interpretation of images:  Medial compartment with moderate degree of osteoarthritis, best seen in the [place] view. Moderate arthritic  changes are present in the patellofemoral as well as lateral compartments. Electronically Signed  By: Owens Loffler, MD On: 02/16/2017 1:51 PM   Assessment and Plan:   Right knee pain, unspecified chronicity - Plan: DG Knee AP/LAT W/Sunrise Right, methylPREDNISolone acetate (DEPO-MEDROL) injection 40 mg  Primary osteoarthritis of right knee  Acute injury on top of osteoarthritis with mechanical symptoms. I suspect that the patient likely has a meniscal injury that is the culprit for this. Conservative measures, , and she will plan to re-had this on her own in the pool.  Knee Injection, R Patient verbally consented to procedure. Risks (including potential rare risk of infection), benefits, and alternatives explained. Sterilely prepped with Chloraprep. Ethyl cholride used for anesthesia. 8 cc Lidocaine 1% mixed with 2 mL Depo-Medrol 40 mg injected using the anteromedial approach without difficulty. No complications with procedure and tolerated well. Patient had decreased pain post-injection.   Follow-up: No Follow-up on file.  Meds ordered this encounter  Medications  . methylPREDNISolone acetate (DEPO-MEDROL) injection 40 mg   Medications Discontinued During This Encounter  Medication Reason  . benzonatate (TESSALON) 100 MG capsule Completed Course   Orders Placed This Encounter  Procedures  . DG Knee AP/LAT W/Sunrise Right    Signed,  Kielan Dreisbach T. Meryn Sarracino, MD   Allergies as of 02/16/2017      Reactions   Alendronate Sodium Other (See Comments)   Reaction:  GI upset    Ginzing [ginseng] Other (See Comments)   Hyper and jitery    Hydrocod Polst-cpm Polst Er Itching   Hydrocodone-acetaminophen Itching   Oxycodone Itching   Peanut-containing Drug Products Other (See Comments)   Reaction:  Migraines    Sumatriptan Succinate    contri-indicated w/ heart conditions   Hydrocodone-guaifenesin Itching   Prednisone Rash      Medication List       Accurate as of 02/16/17  1:52  PM. Always use your most recent med list.          albuterol 108 (90 Base) MCG/ACT inhaler Commonly known as:  PROVENTIL HFA;VENTOLIN HFA Inhale 2 puffs into the lungs every 4 (four) hours as needed for wheezing or shortness of breath.   ALPRAZolam 0.5 MG tablet Commonly known as:  XANAX Take 1 tablet (0.5 mg total) by mouth daily as needed for anxiety.   aspirin EC 81 MG tablet Take 81 mg by mouth at bedtime.   atorvastatin 20 MG tablet Commonly known as:  LIPITOR Take 1 tablet (20 mg total) by mouth daily.  carvedilol 6.25 MG tablet Commonly known as:  COREG Take 1 tablet (6.25 mg total) by mouth 2 (two) times daily with a meal.   cholecalciferol 1000 units tablet Commonly known as:  VITAMIN D Take 2,000 Units by mouth daily.   cyclobenzaprine 10 MG tablet Commonly known as:  FLEXERIL Take 0.5-1 tablets (5-10 mg total) by mouth 3 (three) times daily as needed. For headache   furosemide 20 MG tablet Commonly known as:  LASIX Take 1 tablet (20 mg total) by mouth daily.   lisinopril 5 MG tablet Commonly known as:  PRINIVIL,ZESTRIL TAKE 1/2 TABLET (2.5 MG TOTAL) BY MOUTH DAILY.   magnesium gluconate 500 MG tablet Commonly known as:  MAGONATE Take 500 mg by mouth at bedtime.   Melatonin 3 MG Caps Take 1 capsule by mouth at bedtime as needed.   naproxen sodium 220 MG tablet Commonly known as:  ANAPROX Take 440 mg by mouth 2 (two) times daily as needed (for headaches/pain).   nitroGLYCERIN 0.4 MG SL tablet Commonly known as:  NITROSTAT Place 1 tablet (0.4 mg total) under the tongue every 5 (five) minutes as needed for chest pain.   omeprazole 40 MG capsule Commonly known as:  PRILOSEC Take 1 capsule by mouth daily.   oxyCODONE-acetaminophen 5-325 MG tablet Commonly known as:  PERCOCET/ROXICET Take 2 tablets by mouth every 8 (eight) hours as needed for severe pain.   Potassium Aminobenzoate 500 MG Tabs Take 500 mg by mouth.   promethazine 25 MG  tablet Commonly known as:  PHENERGAN Take 0.5 tablets (12.5 mg total) by mouth every 8 (eight) hours as needed for nausea.   TART CHERRY ADVANCED Caps Take 1 capsule by mouth daily.   venlafaxine XR 75 MG 24 hr capsule Commonly known as:  EFFEXOR-XR Take 75 mg by mouth daily with breakfast.

## 2017-02-23 ENCOUNTER — Ambulatory Visit
Admission: RE | Admit: 2017-02-23 | Discharge: 2017-02-23 | Disposition: A | Discharge status: Home / Self Care | Payer: Medicare HMO | Source: Ambulatory Visit | Attending: Family Medicine | Admitting: Family Medicine

## 2017-02-23 DIAGNOSIS — E2839 Other primary ovarian failure: Secondary | ICD-10-CM | POA: Diagnosis not present

## 2017-02-23 DIAGNOSIS — Z1231 Encounter for screening mammogram for malignant neoplasm of breast: Secondary | ICD-10-CM | POA: Insufficient documentation

## 2017-02-23 DIAGNOSIS — M85852 Other specified disorders of bone density and structure, left thigh: Secondary | ICD-10-CM | POA: Diagnosis not present

## 2017-02-23 DIAGNOSIS — M8588 Other specified disorders of bone density and structure, other site: Secondary | ICD-10-CM | POA: Diagnosis not present

## 2017-02-24 DIAGNOSIS — H2513 Age-related nuclear cataract, bilateral: Secondary | ICD-10-CM | POA: Diagnosis not present

## 2017-02-24 DIAGNOSIS — H1189 Other specified disorders of conjunctiva: Secondary | ICD-10-CM | POA: Diagnosis not present

## 2017-02-24 DIAGNOSIS — H35053 Retinal neovascularization, unspecified, bilateral: Secondary | ICD-10-CM | POA: Diagnosis not present

## 2017-03-01 ENCOUNTER — Ambulatory Visit: Payer: Self-pay | Admitting: Psychology

## 2017-03-02 ENCOUNTER — Ambulatory Visit (INDEPENDENT_AMBULATORY_CARE_PROVIDER_SITE_OTHER): Payer: Medicare HMO | Admitting: Psychology

## 2017-03-02 DIAGNOSIS — F4323 Adjustment disorder with mixed anxiety and depressed mood: Secondary | ICD-10-CM

## 2017-03-03 ENCOUNTER — Ambulatory Visit (INDEPENDENT_AMBULATORY_CARE_PROVIDER_SITE_OTHER): Payer: Medicare HMO | Admitting: Family Medicine

## 2017-03-03 ENCOUNTER — Encounter: Payer: Self-pay | Admitting: Family Medicine

## 2017-03-03 ENCOUNTER — Ambulatory Visit (INDEPENDENT_AMBULATORY_CARE_PROVIDER_SITE_OTHER)
Admission: RE | Admit: 2017-03-03 | Discharge: 2017-03-03 | Disposition: A | Discharge status: Home / Self Care | Payer: Medicare HMO | Source: Ambulatory Visit | Attending: Family Medicine | Admitting: Family Medicine

## 2017-03-03 VITALS — BP 128/76 | HR 67 | Temp 97.7°F | Ht 65.0 in | Wt 185.2 lb

## 2017-03-03 DIAGNOSIS — M2391 Unspecified internal derangement of right knee: Secondary | ICD-10-CM

## 2017-03-03 DIAGNOSIS — S8991XA Unspecified injury of right lower leg, initial encounter: Secondary | ICD-10-CM | POA: Diagnosis not present

## 2017-03-03 DIAGNOSIS — M25561 Pain in right knee: Secondary | ICD-10-CM

## 2017-03-03 NOTE — Progress Notes (Signed)
Dr. Frederico Hamman T. Shannell Mikkelsen, MD, [facility] Sports Medicine [place], [address] Phone: [phone] Fax: [phone]  03/03/2017  Patient: Rachel Torres, MRN: [id number], DOB: Jul 28, 1956, 61 y.o.  Primary Physician:  Loura Pardon, MD   Chief Complaint  Patient presents with  . Fall    right leg injury, can't put weight on it   Subjective:   Rachel Torres is a 61 y.o. very pleasant female patient who presents with the following:  [place] and now with pain in the R leg. h/o osteoporosis. Unable to bear weight. I actually saw the patient about 2-1/2 weeks ago with some knee pain, and at that point I injected her right knee, and she had been feeling well.  She fell on Monday and landed on her hip, but primarily had pain in her knee.  She actually felt good until about 2 days later and then she was having difficulty in a lot of pain with difficulty.  Weight.  She has some minor swelling.  Fell on Monday.   Yesterday was walking ok - not hurting her at all.   R knee patellar J brace  Past Medical History, Surgical History, Social History, Family History, Problem List, Medications, and Allergies have been reviewed and updated if relevant.  Patient Active Problem List   Diagnosis Date Noted  . HPV (human papilloma virus) infection 12/01/2016  . Screening mammogram, encounter for 11/29/2016  . Estrogen deficiency 11/29/2016  . Encounter for routine gynecological examination 11/29/2016  . Grade III hemorrhoids 10/06/2016  . Tachycardia 08/14/2016  . Hemorrhoids 08/13/2016  . Atypical migraine 08/03/2016  . Sweating 07/12/2016  . CAD (coronary artery disease)   . AAA (abdominal aortic aneurysm) ([place])   . Chronic combined systolic (congestive) and diastolic (congestive) heart failure   . Hyperglycemia 02/11/2016  . Generalized anxiety disorder 12/08/2015  . Panic attacks 07/28/2015  . Sensorineural hearing loss 03/27/2013  . Aortic  dissection ([place]) 05/12/2012  . Obesity (BMI 30.0-34.9) 11/21/2011  . Routine general medical examination at a health care facility 09/20/2011  . S/P AVR (aortic valve replacement) 01/07/2011  . CORONARY ARTERY BYPASS GRAFT, HX OF 01/07/2011  . Arthritis 12/20/2010  . H/O dissecting abdominal aortic aneurysm repair 08/24/2010  . Major depressive disorder, single episode 08/24/2010  . IRRITABLE BOWEL SYNDROME 09/09/2009  . Obstructive sleep apnea 08/04/2009  . External hemorrhoid 06/26/2009  . Osteoporosis 01/09/2009  . Hyperlipidemia 07/26/2007  . Depression with anxiety 07/26/2007  . Essential hypertension 07/26/2007  . Allergic rhinitis 07/26/2007  . Asthma 07/26/2007  . GERD 07/26/2007  . Osteoarthritis 07/26/2007  . Ocular migraine 07/26/2007  . Mild intermittent asthma without complication [id number]/38/7564    Past Medical History:  Diagnosis Date  . AAA (abdominal aortic aneurysm) ([place])    a. Chronic w/o evidence of aneurysmal dil on CTA 01/2016.  Marland Kitchen Allergy   . Anxiety   . Asthma   . CAD (coronary artery disease)    a. 08/2010 s/p CABG x 1 (VG->RCA) @ time of Ao dissection repair; b. 01/2016 Lexiscan MV: mid antsept/apical defect w/ ? peri-infarct ischemia-->likely attenuation-->Med Rx.  . Chronic combined systolic (congestive) and diastolic (congestive) heart failure    a. 2012 EF 30-35%; b. 04/2015 EF 40-45%; c. 01/2016 Echo: Ef 55-65%, nl AoV bioprosthesis, nl RV, nl PASP.  Marland Kitchen Depression   . Gallbladder sludge   . GERD (gastroesophageal reflux disease)   . History of Bicuspid Aortic Valve  a. 08/2010 s/p AVR @ time of Ao dissection repair; b. 01/2016 Echo: Ef 55-65%, nl AoV bioprosthesis, nl RV, nl PASP.  Marland Kitchen Hx of repair of dissecting thoracic aortic aneurysm, Stanford type A    a. 08/2010 s/p repair with AVR and VG->RCA; b. 01/2016 CTA: stable appearance of Asc Thoracic Aortic graft. Opacification of flase lumen of chronic abd Ao dissection w/ retrograde flow through lumbar arteries.  No aneurysmal dil of flase lumen.  . Hyperlipidemia   . Hypertensive heart disease   . Marfan syndrome   . Migraine   . Obstructive sleep apnea   . Osteoporosis   . RA (rheumatoid arthritis) ([place])    a. Followed by Dr. [name]    Past Surgical History:  Procedure Laterality Date  . ABDOMINAL AORTIC ANEURYSM REPAIR    . CARDIAC CATHETERIZATION  2001  . [place]  . CHOLECYSTECTOMY    . FRACTURE SURGERY     shoulder replacement  . ORIF DISTAL RADIUS FRACTURE      Social History   Social History  . Marital status: Married    Spouse name: N/A  . Number of children: 2  . Years of education: N/A   Occupational History  . [employer]   Social History Main Topics  . Smoking status: Former Smoker    Quit date: 12/20/1978  . Smokeless tobacco: Never Used  . Alcohol use 0.0 oz/week     Comment: occassional  . Drug use: No  . Sexual activity: No   Other Topics Concern  . Not on file   Social History Narrative  . No narrative on file    Family History  Problem Relation Age of Onset  . Hypertension Mother   . Depression Mother   . Osteoporosis Mother   . Stroke Mother   . Irritable bowel syndrome Mother   . Arthritis Sister   . Diabetes Sister   . Nephrolithiasis Sister   . Osteoporosis Sister   . Arthritis Sister   . Stroke Maternal Grandmother   . Colon cancer Maternal Grandmother   . Lymphoma Maternal Grandmother   . Aneurysm Paternal Grandmother   . Diabetes Paternal Grandmother   . Arthritis Paternal Grandmother   . Arthritis Paternal Grandfather     Allergies  Allergen Reactions  . Alendronate Sodium Other (See Comments)    Reaction:  GI upset   . Ginzing [Ginseng] Other (See Comments)    Hyper and jitery   . Hydrocod Polst-Cpm Polst Er Itching  . Hydrocodone-Acetaminophen Itching  . Oxycodone Itching  . Peanut-Containing Drug Products Other (See Comments)    Reaction:  Migraines   . Sumatriptan Succinate      contri-indicated w/ heart conditions  . Hydrocodone-Guaifenesin Itching  . Prednisone Rash    Medication list reviewed and updated in full in [place].  GEN: No fevers, chills. Nontoxic. Primarily MSK c/o today. MSK: Detailed in the HPI GI: tolerating PO intake without difficulty Neuro: No numbness, parasthesias, or tingling associated. Otherwise the pertinent positives of the ROS are noted above.   Objective:   BP 128/76 (BP Location: Left Arm, Patient Position: Sitting, Cuff Size: Normal)   Pulse 67   Temp 97.7 F (36.5 C) (Oral)   Ht 5\' 5"  (1.651 m)   Wt 185 lb 4 oz (84 kg)   SpO2 97%   BMI 30.83 kg/m    GEN: WDWN, NAD, Non-toxic, Alert & Oriented x 3 HEENT: Atraumatic, Normocephalic.  Ears and  Nose: No external deformity. EXTR: No clubbing/cyanosis/edema NEURO: Normal gait. Notably antalgic gait.  PSYCH: Normally interactive. Conversant. Not depressed or anxious appearing.  Calm demeanor.    Right knee: Mild effusion.  Full extension and flexion 125.  Stable to varus and valgus stress. ACL and PCL are intact.  Nontender at the medial tibial plateau as well as the distal femur.  Nontender at the patella.  She is notably tender along the medial joint line with minimal tenderness on the lateral joint line.  Flexion pinch and McMurray's causes pain.  Nontender throughout the foot and ankle, tibia, fibula, femur, as well as the hip.  The hip moves easily without any difficulty and the pelvis is stable without any pain.  Radiology: Dg Knee Complete 4 Views Right  Result Date: 03/03/2017 CLINICAL DATA:  Fall. EXAM: RIGHT KNEE - COMPLETE 4+ VIEW COMPARISON:  02/16/2017. FINDINGS: No acute bony or joint abnormality identified. No evidence of fracture or dislocation. Surgical clips noted over the thigh. IMPRESSION: No acute bony or joint abnormalities identified. Electronically Signed   By: Marcello Moores  Register   On: 03/03/2017 16:05    Assessment and Plan:   Acute pain of  right knee - Plan: DG Knee Complete 4 Views Right  Internal derangement of right knee  Probable acute meniscal tear in the medial compartment in a 61 year old patient.  Trial of conservative care, to place the patient in a hinged patellar J brace.  Weightbearing as able for the next 7-10 days, then advance as tolerated.  Continue with icing  Follow-up: Return in about 5 weeks (around 04/07/2017).  Orders Placed This Encounter  Procedures  . DG Knee Complete 4 Views Right    Signed,  Frederico Hamman T. Sabriel Borromeo, MD   Allergies as of 03/03/2017      Reactions   Alendronate Sodium Other (See Comments)   Reaction:  GI upset    Ginzing [ginseng] Other (See Comments)   Hyper and jitery    Hydrocod Polst-cpm Polst Er Itching   Hydrocodone-acetaminophen Itching   Oxycodone Itching   Peanut-containing Drug Products Other (See Comments)   Reaction:  Migraines    Sumatriptan Succinate    contri-indicated w/ heart conditions   Hydrocodone-guaifenesin Itching   Prednisone Rash      Medication List       Accurate as of 03/03/17 11:59 PM. Always use your most recent med list.          albuterol 108 (90 Base) MCG/ACT inhaler Commonly known as:  PROVENTIL HFA;VENTOLIN HFA Inhale 2 puffs into the lungs every 4 (four) hours as needed for wheezing or shortness of breath.   ALPRAZolam 0.5 MG tablet Commonly known as:  XANAX Take 1 tablet (0.5 mg total) by mouth daily as needed for anxiety.   aspirin EC 81 MG tablet Take 81 mg by mouth at bedtime.   atorvastatin 20 MG tablet Commonly known as:  LIPITOR Take 1 tablet (20 mg total) by mouth daily.   carvedilol 6.25 MG tablet Commonly known as:  COREG Take 1 tablet (6.25 mg total) by mouth 2 (two) times daily with a meal.   cholecalciferol 1000 units tablet Commonly known as:  VITAMIN D Take 2,000 Units by mouth daily.   cyclobenzaprine 10 MG tablet Commonly known as:  FLEXERIL Take 0.5-1 tablets (5-10 mg total) by mouth 3 (three)  times daily as needed. For headache   furosemide 20 MG tablet Commonly known as:  LASIX Take 1 tablet (20 mg total) by mouth  daily.   lisinopril 5 MG tablet Commonly known as:  PRINIVIL,ZESTRIL TAKE 1/2 TABLET (2.5 MG TOTAL) BY MOUTH DAILY.   magnesium gluconate 500 MG tablet Commonly known as:  MAGONATE Take 500 mg by mouth at bedtime.   Melatonin 3 MG Caps Take 1 capsule by mouth at bedtime as needed.   naproxen sodium 220 MG tablet Commonly known as:  ANAPROX Take 440 mg by mouth 2 (two) times daily as needed (for headaches/pain).   nitroGLYCERIN 0.4 MG SL tablet Commonly known as:  NITROSTAT Place 1 tablet (0.4 mg total) under the tongue every 5 (five) minutes as needed for chest pain.   omeprazole 40 MG capsule Commonly known as:  PRILOSEC Take 1 capsule by mouth daily.   oxyCODONE-acetaminophen 5-325 MG tablet Commonly known as:  PERCOCET/ROXICET Take 2 tablets by mouth every 8 (eight) hours as needed for severe pain.   Potassium Aminobenzoate 500 MG Tabs Take 500 mg by mouth.   promethazine 25 MG tablet Commonly known as:  PHENERGAN Take 0.5 tablets (12.5 mg total) by mouth every 8 (eight) hours as needed for nausea.   TART CHERRY ADVANCED Caps Take 1 capsule by mouth daily.   venlafaxine XR 75 MG 24 hr capsule Commonly known as:  EFFEXOR-XR Take 75 mg by mouth daily with breakfast.

## 2017-03-03 NOTE — Progress Notes (Signed)
Pre visit review using our clinic review tool, if applicable. No additional management support is needed unless otherwise documented below in the visit note. 

## 2017-03-08 DIAGNOSIS — H2513 Age-related nuclear cataract, bilateral: Secondary | ICD-10-CM | POA: Diagnosis not present

## 2017-03-08 DIAGNOSIS — H16403 Unspecified corneal neovascularization, bilateral: Secondary | ICD-10-CM | POA: Diagnosis not present

## 2017-03-10 ENCOUNTER — Ambulatory Visit (INDEPENDENT_AMBULATORY_CARE_PROVIDER_SITE_OTHER): Payer: Medicare HMO | Admitting: Psychology

## 2017-03-10 DIAGNOSIS — F4323 Adjustment disorder with mixed anxiety and depressed mood: Secondary | ICD-10-CM

## 2017-03-10 DIAGNOSIS — H2513 Age-related nuclear cataract, bilateral: Secondary | ICD-10-CM | POA: Diagnosis not present

## 2017-03-10 DIAGNOSIS — H1132 Conjunctival hemorrhage, left eye: Secondary | ICD-10-CM | POA: Diagnosis not present

## 2017-03-16 ENCOUNTER — Ambulatory Visit (INDEPENDENT_AMBULATORY_CARE_PROVIDER_SITE_OTHER): Payer: Medicare HMO | Admitting: Family Medicine

## 2017-03-16 ENCOUNTER — Encounter: Payer: Self-pay | Admitting: Family Medicine

## 2017-03-16 VITALS — BP 110/62 | HR 74 | Temp 97.8°F | Ht 65.0 in | Wt 190.0 lb

## 2017-03-16 DIAGNOSIS — M25561 Pain in right knee: Secondary | ICD-10-CM | POA: Diagnosis not present

## 2017-03-16 NOTE — Patient Instructions (Signed)

## 2017-03-16 NOTE — Progress Notes (Signed)
Dr. Frederico Hamman T. Cordell Guercio, MD, [facility] [place], [address] Phone: [phone] Fax: [phone]  03/16/2017  Patient: Rachel Torres, MRN: [id number], DOB: June 09, 1956, 61 y.o.  Primary Physician:  Loura Pardon, MD   Chief Complaint  Patient presents with  . Knee Pain    right   Subjective:   Rachel Torres is a 61 y.o. very pleasant female patient who presents with the following:  R knee pain: The patient is post trauma, fell, and now she is continuing to have persistent pain, following up 13 days after my most recent visit for her knee. She is still having quite a bit of pain in the right knee, difficulty ambulating, and having difficulty putting her full weight on her knee. She is using a cane, previously she had no use for any assistive device.  03/03/2017 Last OV with Owens Loffler, MD   [place] and now with pain in the R leg. h/o osteoporosis. Unable to bear weight. I actually saw the patient about 2-1/2 weeks ago with some knee pain, and at that point I injected her right knee, and she had been feeling well.  She fell on Monday and landed on her hip, but primarily had pain in her knee.  She actually felt good until about 2 days later and then she was having difficulty in a lot of pain with difficulty.  Weight.  She has some minor swelling.  Fell on Monday.   Yesterday was walking ok - not hurting her at all.   Past Medical History, Surgical History, Social History, Family History, Problem List, Medications, and Allergies have been reviewed and updated if relevant.  Patient Active Problem List   Diagnosis Date Noted  . HPV (human papilloma virus) infection 12/01/2016  . Screening mammogram, encounter for 11/29/2016  . Estrogen deficiency 11/29/2016  . Encounter for routine gynecological examination 11/29/2016  . Grade III hemorrhoids 10/06/2016  . Tachycardia 08/14/2016  . Hemorrhoids 08/13/2016  .  Atypical migraine 08/03/2016  . Sweating 07/12/2016  . CAD (coronary artery disease)   . AAA (abdominal aortic aneurysm) ([place])   . Chronic combined systolic (congestive) and diastolic (congestive) heart failure   . Hyperglycemia 02/11/2016  . Generalized anxiety disorder 12/08/2015  . Panic attacks 07/28/2015  . Sensorineural hearing loss 03/27/2013  . Aortic dissection ([place]) 05/12/2012  . Obesity (BMI 30.0-34.9) 11/21/2011  . Routine general medical examination at a health care facility 09/20/2011  . S/P AVR (aortic valve replacement) 01/07/2011  . CORONARY ARTERY BYPASS GRAFT, HX OF 01/07/2011  . Arthritis 12/20/2010  . H/O dissecting abdominal aortic aneurysm repair 08/24/2010  . Major depressive disorder, single episode 08/24/2010  . IRRITABLE BOWEL SYNDROME 09/09/2009  . Obstructive sleep apnea 08/04/2009  . External hemorrhoid 06/26/2009  . Osteoporosis 01/09/2009  . Hyperlipidemia 07/26/2007  . Depression with anxiety 07/26/2007  . Essential hypertension 07/26/2007  . Allergic rhinitis 07/26/2007  . Asthma 07/26/2007  . GERD 07/26/2007  . Osteoarthritis 07/26/2007  . Ocular migraine 07/26/2007  . Mild intermittent asthma without complication 76/81/1572    Past Medical History:  Diagnosis Date  . AAA (abdominal aortic aneurysm) ([place])    a. Chronic w/o evidence of aneurysmal dil on CTA 01/2016.  Marland Kitchen Allergy   . Anxiety   . Asthma   . CAD (coronary artery disease)    a. 08/2010 s/p CABG x 1 (VG->RCA) @ time of Ao dissection repair; b. 01/2016 Lexiscan MV: mid antsept/apical  defect w/ ? peri-infarct ischemia-->likely attenuation-->Med Rx.  . Chronic combined systolic (congestive) and diastolic (congestive) heart failure    a. 2012 EF 30-35%; b. 04/2015 EF 40-45%; c. 01/2016 Echo: Ef 55-65%, nl AoV bioprosthesis, nl RV, nl PASP.  Marland Kitchen Depression   . Gallbladder sludge   . GERD (gastroesophageal reflux disease)   . History of Bicuspid Aortic Valve    a. 08/2010 s/p AVR @ time of  Ao dissection repair; b. 01/2016 Echo: Ef 55-65%, nl AoV bioprosthesis, nl RV, nl PASP.  Marland Kitchen Hx of repair of dissecting thoracic aortic aneurysm, Stanford type A    a. 08/2010 s/p repair with AVR and VG->RCA; b. 01/2016 CTA: stable appearance of Asc Thoracic Aortic graft. Opacification of flase lumen of chronic abd Ao dissection w/ retrograde flow through lumbar arteries. No aneurysmal dil of flase lumen.  . Hyperlipidemia   . Hypertensive heart disease   . Marfan syndrome   . Migraine   . Obstructive sleep apnea   . Osteoporosis   . RA (rheumatoid arthritis) ([name])    a. Followed by Dr. [name]    Past Surgical History:  Procedure Laterality Date  . ABDOMINAL AORTIC ANEURYSM REPAIR    . CARDIAC CATHETERIZATION  2001  . [place]  . CHOLECYSTECTOMY    . FRACTURE SURGERY     shoulder replacement  . ORIF DISTAL RADIUS FRACTURE      Social History   Social History  . Marital status: Married    Spouse name: N/A  . Number of children: 2  . Years of education: N/A   Occupational History  . [employer]   Social History Main Topics  . Smoking status: Former Smoker    Quit date: 12/20/1978  . Smokeless tobacco: Never Used  . Alcohol use 0.0 oz/week     Comment: occassional  . Drug use: No  . Sexual activity: No   Other Topics Concern  . Not on file   Social History Narrative  . No narrative on file    Family History  Problem Relation Age of Onset  . Hypertension Mother   . Depression Mother   . Osteoporosis Mother   . Stroke Mother   . Irritable bowel syndrome Mother   . Arthritis Sister   . Diabetes Sister   . Nephrolithiasis Sister   . Osteoporosis Sister   . Arthritis Sister   . Stroke Maternal Grandmother   . Colon cancer Maternal Grandmother   . Lymphoma Maternal Grandmother   . Aneurysm Paternal Grandmother   . Diabetes Paternal Grandmother   . Arthritis Paternal Grandmother   . Arthritis Paternal Grandfather     Allergies  Allergen  Reactions  . Alendronate Sodium Other (See Comments)    Reaction:  GI upset   . Ginzing [Ginseng] Other (See Comments)    Hyper and jitery   . Hydrocod Polst-Cpm Polst Er Itching  . Hydrocodone-Acetaminophen Itching  . Oxycodone Itching  . Peanut-Containing Drug Products Other (See Comments)    Reaction:  Migraines   . Sumatriptan Succinate     contri-indicated w/ heart conditions  . Hydrocodone-Guaifenesin Itching  . Prednisone Rash    Medication list reviewed and updated in full in [place].  GEN: No fevers, chills. Nontoxic. Primarily MSK c/o today. MSK: Detailed in the HPI GI: tolerating PO intake without difficulty Neuro: No numbness, parasthesias, or tingling associated. Otherwise the pertinent positives of the ROS are noted above.   Objective:  BP 110/62   Pulse 74   Temp 97.8 F (36.6 C) (Oral)   Ht 5\' 5"  (1.651 m)   Wt 190 lb (86.2 kg)   BMI 31.62 kg/m    GEN: WDWN, NAD, Non-toxic, Alert & Oriented x 3 HEENT: Atraumatic, Normocephalic.  Ears and Nose: No external deformity. EXTR: Notable antalgia. PSYCH: Normally interactive. Conversant. Not depressed or anxious appearing.  Calm demeanor.    Right knee: Mild effusion. Pain with ballottement of the patella and with loading the patellar facets. Stable to varus and valgus stress. Anterior cruciate ligament and PCL appear to be intact. Notable tenderness on the medial greater than lateral joint line. There is also some tenderness in the distal femur and in the proximal tibia. McMurray's test is positive for pain. Flexion pinch test is positive. Bounce home test is positive.  Radiology: CLINICAL DATA:  Fall.  EXAM: RIGHT KNEE - COMPLETE 4+ VIEW  COMPARISON:  02/16/2017.  FINDINGS: No acute bony or joint abnormality identified. No evidence of fracture or dislocation. Surgical clips noted over the thigh.  IMPRESSION: No acute bony or joint abnormalities identified.   Electronically Signed    By: Marcello Moores  Register   On: 03/03/2017 16:05  Assessment and Plan:   Acute pain of right knee - Plan: MR Knee Right Wo Contrast  This is the third office visit for knee pain for the patient in one month. She is doing poorly, now requiring a cane, limping considerably after a traumatic fall. Initial plain films are negative. Obtain an MRI of the right knee to evaluate for occult fracture not seen on original x-ray, stress fracture in an osteoporotic patient, or other acute internal derangement.  Follow-up: depending on MRI results.  Orders Placed This Encounter  Procedures  . MR Knee Right Wo Contrast    Signed,  Isham Smitherman T. Florene Brill, MD   Allergies as of 03/16/2017      Reactions   Alendronate Sodium Other (See Comments)   Reaction:  GI upset    Ginzing [ginseng] Other (See Comments)   Hyper and jitery    Hydrocod Polst-cpm Polst Er Itching   Hydrocodone-acetaminophen Itching   Oxycodone Itching   Peanut-containing Drug Products Other (See Comments)   Reaction:  Migraines    Sumatriptan Succinate    contri-indicated w/ heart conditions   Hydrocodone-guaifenesin Itching   Prednisone Rash      Medication List       Accurate as of 03/16/17  2:37 PM. Always use your most recent med list.          albuterol 108 (90 Base) MCG/ACT inhaler Commonly known as:  PROVENTIL HFA;VENTOLIN HFA Inhale 2 puffs into the lungs every 4 (four) hours as needed for wheezing or shortness of breath.   ALPRAZolam 0.5 MG tablet Commonly known as:  XANAX Take 1 tablet (0.5 mg total) by mouth daily as needed for anxiety.   aspirin EC 81 MG tablet Take 81 mg by mouth at bedtime.   atorvastatin 20 MG tablet Commonly known as:  LIPITOR Take 1 tablet (20 mg total) by mouth daily.   carvedilol 6.25 MG tablet Commonly known as:  COREG Take 1 tablet (6.25 mg total) by mouth 2 (two) times daily with a meal.   cholecalciferol 1000 units tablet Commonly known as:  VITAMIN D Take 2,000 Units by  mouth daily.   cyclobenzaprine 10 MG tablet Commonly known as:  FLEXERIL Take 0.5-1 tablets (5-10 mg total) by mouth 3 (three) times daily as needed.  For headache   furosemide 20 MG tablet Commonly known as:  LASIX Take 1 tablet (20 mg total) by mouth daily.   lisinopril 5 MG tablet Commonly known as:  PRINIVIL,ZESTRIL TAKE 1/2 TABLET (2.5 MG TOTAL) BY MOUTH DAILY.   magnesium gluconate 500 MG tablet Commonly known as:  MAGONATE Take 500 mg by mouth at bedtime.   Melatonin 3 MG Caps Take 1 capsule by mouth at bedtime as needed.   naproxen sodium 220 MG tablet Commonly known as:  ANAPROX Take 440 mg by mouth 2 (two) times daily as needed (for headaches/pain).   nitroGLYCERIN 0.4 MG SL tablet Commonly known as:  NITROSTAT Place 1 tablet (0.4 mg total) under the tongue every 5 (five) minutes as needed for chest pain.   omeprazole 40 MG capsule Commonly known as:  PRILOSEC Take 1 capsule by mouth daily.   oxyCODONE-acetaminophen 5-325 MG tablet Commonly known as:  PERCOCET/ROXICET Take 2 tablets by mouth every 8 (eight) hours as needed for severe pain.   Potassium Aminobenzoate 500 MG Tabs Take 500 mg by mouth.   promethazine 25 MG tablet Commonly known as:  PHENERGAN Take 0.5 tablets (12.5 mg total) by mouth every 8 (eight) hours as needed for nausea.   TART CHERRY ADVANCED Caps Take 1 capsule by mouth daily.   venlafaxine XR 75 MG 24 hr capsule Commonly known as:  EFFEXOR-XR Take 75 mg by mouth daily with breakfast.

## 2017-03-16 NOTE — Progress Notes (Signed)
Pre visit review using our clinic review tool, if applicable. No additional management support is needed unless otherwise documented below in the visit note. 

## 2017-03-22 ENCOUNTER — Ambulatory Visit (INDEPENDENT_AMBULATORY_CARE_PROVIDER_SITE_OTHER): Payer: Medicare HMO | Admitting: Psychology

## 2017-03-22 DIAGNOSIS — F4323 Adjustment disorder with mixed anxiety and depressed mood: Secondary | ICD-10-CM

## 2017-03-30 ENCOUNTER — Ambulatory Visit: Payer: Medicare HMO | Admitting: Psychology

## 2017-03-30 ENCOUNTER — Ambulatory Visit: Payer: Medicare HMO

## 2017-03-31 ENCOUNTER — Ambulatory Visit (INDEPENDENT_AMBULATORY_CARE_PROVIDER_SITE_OTHER): Payer: Medicare HMO | Admitting: Psychology

## 2017-03-31 DIAGNOSIS — F4323 Adjustment disorder with mixed anxiety and depressed mood: Secondary | ICD-10-CM | POA: Diagnosis not present

## 2017-04-02 DIAGNOSIS — F410 Panic disorder [episodic paroxysmal anxiety] without agoraphobia: Secondary | ICD-10-CM | POA: Diagnosis not present

## 2017-04-02 DIAGNOSIS — H53149 Visual discomfort, unspecified: Secondary | ICD-10-CM | POA: Diagnosis not present

## 2017-04-02 DIAGNOSIS — H43392 Other vitreous opacities, left eye: Secondary | ICD-10-CM | POA: Diagnosis not present

## 2017-04-02 DIAGNOSIS — H538 Other visual disturbances: Secondary | ICD-10-CM | POA: Diagnosis not present

## 2017-04-02 DIAGNOSIS — F329 Major depressive disorder, single episode, unspecified: Secondary | ICD-10-CM | POA: Diagnosis not present

## 2017-04-02 DIAGNOSIS — Z87891 Personal history of nicotine dependence: Secondary | ICD-10-CM | POA: Diagnosis not present

## 2017-04-02 DIAGNOSIS — I5042 Chronic combined systolic (congestive) and diastolic (congestive) heart failure: Secondary | ICD-10-CM | POA: Diagnosis not present

## 2017-04-02 DIAGNOSIS — Z885 Allergy status to narcotic agent status: Secondary | ICD-10-CM | POA: Diagnosis not present

## 2017-04-02 DIAGNOSIS — H43391 Other vitreous opacities, right eye: Secondary | ICD-10-CM | POA: Diagnosis not present

## 2017-04-02 DIAGNOSIS — I2510 Atherosclerotic heart disease of native coronary artery without angina pectoris: Secondary | ICD-10-CM | POA: Diagnosis not present

## 2017-04-02 DIAGNOSIS — H43813 Vitreous degeneration, bilateral: Secondary | ICD-10-CM | POA: Diagnosis not present

## 2017-04-06 DIAGNOSIS — M25561 Pain in right knee: Secondary | ICD-10-CM | POA: Diagnosis not present

## 2017-04-06 DIAGNOSIS — M1711 Unilateral primary osteoarthritis, right knee: Secondary | ICD-10-CM | POA: Diagnosis not present

## 2017-04-06 DIAGNOSIS — M170 Bilateral primary osteoarthritis of knee: Secondary | ICD-10-CM | POA: Diagnosis not present

## 2017-04-07 DIAGNOSIS — H16403 Unspecified corneal neovascularization, bilateral: Secondary | ICD-10-CM | POA: Diagnosis not present

## 2017-04-07 DIAGNOSIS — H43813 Vitreous degeneration, bilateral: Secondary | ICD-10-CM | POA: Diagnosis not present

## 2017-04-07 DIAGNOSIS — H2513 Age-related nuclear cataract, bilateral: Secondary | ICD-10-CM | POA: Diagnosis not present

## 2017-04-11 ENCOUNTER — Ambulatory Visit: Payer: Self-pay | Admitting: Family Medicine

## 2017-04-12 ENCOUNTER — Other Ambulatory Visit: Payer: Self-pay | Admitting: Physician Assistant

## 2017-04-13 ENCOUNTER — Encounter: Payer: Self-pay | Admitting: Internal Medicine

## 2017-04-13 ENCOUNTER — Encounter: Payer: Self-pay | Admitting: Family Medicine

## 2017-04-13 ENCOUNTER — Ambulatory Visit (INDEPENDENT_AMBULATORY_CARE_PROVIDER_SITE_OTHER): Payer: Medicare HMO | Admitting: Family Medicine

## 2017-04-13 VITALS — BP 126/76 | HR 88 | Temp 98.4°F | Wt 189.0 lb

## 2017-04-13 DIAGNOSIS — M1711 Unilateral primary osteoarthritis, right knee: Secondary | ICD-10-CM | POA: Diagnosis not present

## 2017-04-13 DIAGNOSIS — K645 Perianal venous thrombosis: Secondary | ICD-10-CM

## 2017-04-13 DIAGNOSIS — K641 Second degree hemorrhoids: Secondary | ICD-10-CM

## 2017-04-13 DIAGNOSIS — M25561 Pain in right knee: Secondary | ICD-10-CM | POA: Diagnosis not present

## 2017-04-13 MED ORDER — HYDROCORTISONE ACETATE 25 MG RE SUPP: 25.0000 mg | Freq: Two times a day (BID) | RECTAL | 0 refills | Status: DC | PRN

## 2017-04-13 NOTE — Progress Notes (Signed)
Pre visit review using our clinic review tool, if applicable. No additional management support is needed unless otherwise documented below in the visit note. 

## 2017-04-13 NOTE — Progress Notes (Signed)
Subjective:    Patient ID: Rachel Torres, female    DOB: 09-24-1956, 61 y.o.   MRN: [id number]  HPI This is a 61 yo female who presents today with with rectal pain for several days that has gotten progressively worse. No fever or chills. No blood in toilet or on tissue. No constipation or diarrhea. Has history of intermittent hemorrhoids x several years. Feels area of fullness in rectum, this is harder than her usual hemorrhoids and she is concerned that the area might be a boil or infected.   Past Medical History:  Diagnosis Date  . AAA (abdominal aortic aneurysm) ([place])    a. Chronic w/o evidence of aneurysmal dil on CTA 01/2016.  Marland Kitchen Allergy   . Anxiety   . Asthma   . CAD (coronary artery disease)    a. 08/2010 s/p CABG x 1 (VG->RCA) @ time of Ao dissection repair; b. 01/2016 Lexiscan MV: mid antsept/apical defect w/ ? peri-infarct ischemia-->likely attenuation-->Med Rx.  . Chronic combined systolic (congestive) and diastolic (congestive) heart failure ([place])    a. 2012 EF 30-35%; b. 04/2015 EF 40-45%; c. 01/2016 Echo: Ef 55-65%, nl AoV bioprosthesis, nl RV, nl PASP.  Marland Kitchen Depression   . Gallbladder sludge   . GERD (gastroesophageal reflux disease)   . History of Bicuspid Aortic Valve    a. 08/2010 s/p AVR @ time of Ao dissection repair; b. 01/2016 Echo: Ef 55-65%, nl AoV bioprosthesis, nl RV, nl PASP.  Marland Kitchen Hx of repair of dissecting thoracic aortic aneurysm, Stanford type A    a. 08/2010 s/p repair with AVR and VG->RCA; b. 01/2016 CTA: stable appearance of Asc Thoracic Aortic graft. Opacification of flase lumen of chronic abd Ao dissection w/ retrograde flow through lumbar arteries. No aneurysmal dil of flase lumen.  . Hyperlipidemia   . Hypertensive heart disease   . Marfan syndrome   . Migraine   . Obstructive sleep apnea   . Osteoporosis   . RA (rheumatoid arthritis) ([place])    a. Followed by Dr. [name]   Past Surgical History:  Procedure Laterality Date  . ABDOMINAL AORTIC ANEURYSM  REPAIR    . CARDIAC CATHETERIZATION  2001  . [place]  . CHOLECYSTECTOMY    . FRACTURE SURGERY     shoulder replacement  . ORIF DISTAL RADIUS FRACTURE       Review of Systems Per HPI    Objective:   Physical Exam  Constitutional: She is oriented to person, place, and time. She appears well-developed and well-nourished. No distress.  HENT:  Head: Normocephalic and atraumatic.  Eyes: Conjunctivae are normal.  Cardiovascular: Normal rate.   Pulmonary/Chest: Effort normal.  Genitourinary: Rectal exam shows external hemorrhoid (thrombosed hemorrhoid at 9 o'clock, non thrombosed, reducible hemorrhoid at 5 o'clock. ) and tenderness.  Neurological: She is alert and oriented to person, place, and time.  Skin: Skin is warm and dry. She is not diaphoretic.  Psychiatric: She has a normal mood and affect. Her behavior is normal. Judgment and thought content normal.  Vitals reviewed.     BP 126/76 (BP Location: Right Arm, Patient Position: Sitting, Cuff Size: Normal)   Pulse 88   Temp 98.4 F (36.9 C) (Oral)   Wt 189 lb (85.7 kg)   SpO2 96%   BMI 31.45 kg/m  Wt Readings from Last 3 Encounters:  04/13/17 189 lb (85.7 kg)  03/16/17 190 lb (86.2 kg)  03/03/17 185 lb 4 oz (84 kg)  Assessment & Plan:  1. External thrombosed hemorrhoids - Provided written and verbal information regarding diagnosis and treatment. - hydrocortisone (ANUSOL-HC) 25 MG suppository; Place 1 suppository (25 mg total) rectally 2 (two) times daily as needed for hemorrhoids or itching.  Dispense: 12 suppository; Refill: 0 - Ambulatory referral to Gastroenterology- patient interested in banding  2. Grade II hemorrhoids - hydrocortisone (ANUSOL-HC) 25 MG suppository; Place 1 suppository (25 mg total) rectally 2 (two) times daily as needed for hemorrhoids or itching.  Dispense: 12 suppository; Refill: 0 - Ambulatory referral to Gastroenterology   Clarene Reamer, FNP-BC  [place]  [facility]  04/13/2017 8:55 PM

## 2017-04-13 NOTE — Patient Instructions (Signed)
Hemorrhoids Hemorrhoids are swollen veins in and around the rectum or anus. There are two types of hemorrhoids:  Internal hemorrhoids. These occur in the veins that are just inside the rectum. They may poke through to the outside and become irritated and painful.  External hemorrhoids. These occur in the veins that are outside of the anus and can be felt as a painful swelling or hard lump near the anus.  Most hemorrhoids do not cause serious problems, and they can be managed with home treatments such as diet and lifestyle changes. If home treatments do not help your symptoms, procedures can be done to shrink or remove the hemorrhoids. What are the causes? This condition is caused by increased pressure in the anal area. This pressure may result from various things, including:  Constipation.  Straining to have a bowel movement.  Diarrhea.  Pregnancy.  Obesity.  Sitting for long periods of time.  Heavy lifting or other activity that causes you to strain.  Anal sex.  What are the signs or symptoms? Symptoms of this condition include:  Pain.  Anal itching or irritation.  Rectal bleeding.  Leakage of stool (feces).  Anal swelling.  One or more lumps around the anus.  How is this diagnosed? This condition can often be diagnosed through a visual exam. Other exams or tests may also be done, such as:  Examination of the rectal area with a gloved hand (digital rectal exam).  Examination of the anal canal using a small tube (anoscope).  A blood test, if you have lost a significant amount of blood.  A test to look inside the colon (sigmoidoscopy or colonoscopy).  How is this treated? This condition can usually be treated at home. However, various procedures may be done if dietary changes, lifestyle changes, and other home treatments do not help your symptoms. These procedures can help make the hemorrhoids smaller or remove them completely. Some of these procedures involve  surgery, and others do not. Common procedures include:  Rubber band ligation. Rubber bands are placed at the base of the hemorrhoids to cut off the blood supply to them.  Sclerotherapy. Medicine is injected into the hemorrhoids to shrink them.  Infrared coagulation. A type of light energy is used to get rid of the hemorrhoids.  Hemorrhoidectomy surgery. The hemorrhoids are surgically removed, and the veins that supply them are tied off.  Stapled hemorrhoidopexy surgery. A circular stapling device is used to remove the hemorrhoids and use staples to cut off the blood supply to them.  Follow these instructions at home: Eating and drinking  Eat foods that have a lot of fiber in them, such as whole grains, beans, nuts, fruits, and vegetables. Ask your health care provider about taking products that have added fiber (fiber supplements).  Drink enough fluid to keep your urine clear or pale yellow. Managing pain and swelling  Take warm sitz baths for 20 minutes, 3-4 times a day to ease pain and discomfort.  If directed, apply ice to the affected area. Using ice packs between sitz baths may be helpful. ? Put ice in a plastic bag. ? Place a towel between your skin and the bag. ? Leave the ice on for 20 minutes, 2-3 times a day. General instructions  Take over-the-counter and prescription medicines only as told by your health care provider.  Use medicated creams or suppositories as told.  Exercise regularly.  Go to the bathroom when you have the urge to have a bowel movement. Do not wait.    Avoid straining to have bowel movements.  Keep the anal area dry and clean. Use wet toilet paper or moist towelettes after a bowel movement.  Do not sit on the toilet for long periods of time. This increases blood pooling and pain. Contact a health care provider if:  You have increasing pain and swelling that are not controlled by treatment or medicine.  You have uncontrolled bleeding.  You  have difficulty having a bowel movement, or you are unable to have a bowel movement.  You have pain or inflammation outside the area of the hemorrhoids. This information is not intended to replace advice given to you by your health care provider. Make sure you discuss any questions you have with your health care provider. Document Released: 12/03/2000 Document Revised: 05/05/2016 Document Reviewed: 08/20/2015 Elsevier Interactive Patient Education  2017 [employer]vier Inc.  

## 2017-04-14 ENCOUNTER — Telehealth: Payer: Self-pay | Admitting: *Deleted

## 2017-04-14 NOTE — Telephone Encounter (Signed)
PA done at www.covermymeds.com for pt's anusol-hc suppositories. PA was denied, routed message to Tor Netters, NP who prescribed Rx for pt so she is aware

## 2017-04-15 ENCOUNTER — Other Ambulatory Visit: Payer: Self-pay | Admitting: Family Medicine

## 2017-04-15 ENCOUNTER — Other Ambulatory Visit: Payer: Self-pay

## 2017-04-15 ENCOUNTER — Telehealth: Payer: Self-pay | Admitting: Cardiovascular Disease

## 2017-04-15 MED ORDER — OMEPRAZOLE 40 MG PO CPDR: 40.0000 mg | DELAYED_RELEASE_CAPSULE | Freq: Every day | ORAL | 2 refills | Status: DC

## 2017-04-15 MED ORDER — HYDROCORTISONE 2.5 % EX CREA: TOPICAL_CREAM | Freq: Two times a day (BID) | CUTANEOUS | 0 refills | Status: DC

## 2017-04-15 NOTE — Telephone Encounter (Signed)
Please call her and tell her that I am sorry the suppositories weren't covered by her insurance. I have sent in some prescription strength hydrocortisone cream and she can use twice a day for up to 7 days.

## 2017-04-15 NOTE — Telephone Encounter (Signed)
Received refill request for potassium chloride ER 10 meq from [facility]. [name] chart shows that she takes potassium aminobenzoate 500 mg. I am unsure is this is in OTC med or if this was prescribed by another MD. Left message for pt to call back to clarify.

## 2017-04-15 NOTE — Telephone Encounter (Signed)
Called and spoke with patient informing her of recommendations. Patient verbalized understanding nothing further needed at this time.

## 2017-04-16 ENCOUNTER — Encounter: Payer: Self-pay | Admitting: Family Medicine

## 2017-04-18 ENCOUNTER — Ambulatory Visit (INDEPENDENT_AMBULATORY_CARE_PROVIDER_SITE_OTHER): Payer: Medicare HMO | Admitting: Psychology

## 2017-04-18 DIAGNOSIS — F4323 Adjustment disorder with mixed anxiety and depressed mood: Secondary | ICD-10-CM

## 2017-04-19 ENCOUNTER — Telehealth: Payer: Self-pay

## 2017-04-19 ENCOUNTER — Ambulatory Visit (INDEPENDENT_AMBULATORY_CARE_PROVIDER_SITE_OTHER): Payer: Medicare HMO | Admitting: Psychology

## 2017-04-19 ENCOUNTER — Ambulatory Visit: Payer: Medicare HMO | Admitting: Psychology

## 2017-04-19 DIAGNOSIS — F4323 Adjustment disorder with mixed anxiety and depressed mood: Secondary | ICD-10-CM | POA: Diagnosis not present

## 2017-04-19 NOTE — Telephone Encounter (Signed)
Spoke with [place] and moved her appointment up to 04/29/17 at 3:15pm per Dr Carlean Purl.

## 2017-04-19 NOTE — Telephone Encounter (Signed)
-----   Message from Gatha Mayer, MD sent at 04/19/2017  7:55 AM EDT ----- Rachel Torres will see if she can do 5/11 315 PM -   Benicia Bergevin - its a banding appt - which is possible not definite so change it to an OV  Dx hemorrhoids   ----- Message ----- From: Elby Beck, FNP Sent: 04/15/2017  11:37 AM To: Gatha Mayer, MD  Do you think there is any possibility to get her in sooner for her hemorrhoids? They gave her an appointment for June?  Thanks :)

## 2017-04-20 DIAGNOSIS — M1711 Unilateral primary osteoarthritis, right knee: Secondary | ICD-10-CM | POA: Diagnosis not present

## 2017-04-20 DIAGNOSIS — M25561 Pain in right knee: Secondary | ICD-10-CM | POA: Diagnosis not present

## 2017-04-23 DIAGNOSIS — I7101 Dissection of ascending aorta: Secondary | ICD-10-CM | POA: Insufficient documentation

## 2017-04-23 NOTE — Progress Notes (Signed)
Cardiology Office Note  Date:  04/26/2017   ID:  [name], DOB 05-10-1956, MRN [id number]  PCP:  [name], MD   Chief Complaint  Patient presents with  . other    6 month follow up. Meds reviewed by the pt. verbally. "doing well."     HPI:  [name] is a very pleasant 61 year-old woman who suffered an aorta type A dissection in September 2011   rushed to [facility] and underwent aortic valve replacement,  bypass of the right coronary artery with venous donor site from her right thigh, aortic grafting for a extensive dissection of the  ascending aorta, residual descending aortic dissection extending into the iliac artery,  chronic back pain, previous symptoms of shortness of breath,  abdominal pain,  hypertension  She reports pathology of her aortic valve showed bicuspid valve She presents today for follow-up of her aortic valve replacement and CABG.  In follow-up today she feels well, denies any significant chest pain or shortness of breath  Once a month or so has severe reflux symptoms  Relies on the triple cocktail for GERD provided by the emergency room  She has prescription for this but needs a refill   She did see vascular surgery , Dr. Donnetta Hutching, seen 01/2017  recommendation was forf/u in 2 years with CT scan of her chest abdomen and pelvis.  Echo 01/2016, normal EF >55%  Otherwise no new symptoms no regular exercise program Taking care of her husband who has Parkinson's   continues to lose weight intentionally.   Previous  30 day monitor ordered for palpitations,  APCs and PVCs Heart rate was not particularly elevated on low-dose carvedilol  Lab work  HBA1C 5.3  tolerating Lipitor Lab Results  Component Value Date   CHOL 132 11/23/2016   HDL 45.80 11/23/2016   LDLCALC 69 11/23/2016   EKG personally reviewed by myself on todays visit Shows normal sinus rhythm with rate 68 bpm nonspecific T wave abnormality in V1 through V4 , no significant  change from previous EKG   Other past medical history reviewed Echocardiogram May 2016 shows slightly improved cardiac function, ejection fraction 40-45%.   diagnosed with obstructive sleep apnea. She has a CPAP machine but she is not using it.  Previous evaluations in the emergency room for chest pain. Cardiac enzymes were negative , EKG unchanged .She had a CT scan dated 06/20/2013. She was transferred to [facility] given her complicated anatomy. images were evaluated by vascular surgery it was determined there have been no significant progression of her disease  Previous evaluation at [facility] for shortness of breath and chest discomfort. She had workup for cardiac issues including echocardiogram and PET stress, CTA of the chest showing stable descending aorta repair with type B. aortic dissection with opacification of the false lumen at the level of the renal arteries via a lumbar artery.  Echocardiogram showed ejection fraction had improved to 45-50%, mild MR, TR (ejection fraction improved from 30-35% in January 2012)  Previous H/o abdominal pain, further workup showing biliary stones and sludge. s/p  laparoscopic cholecystectomy at [facility].  She takes Lasix for urinary retention.   PMH:   has a past medical history of AAA (abdominal aortic aneurysm) ([place]); Allergy; Anxiety; Asthma; CAD (coronary artery disease); Chronic combined systolic (congestive) and diastolic (congestive) heart failure ([place]); Depression; Gallbladder sludge; GERD (gastroesophageal reflux disease); History of Bicuspid Aortic Valve; repair of dissecting thoracic aortic aneurysm, Stanford type A; Hyperlipidemia; Hypertensive heart disease; Marfan syndrome;  Migraine; Obstructive sleep apnea; Osteoporosis; and RA (rheumatoid arthritis) ([place]).  PSH:    Past Surgical History:  Procedure Laterality Date  . ABDOMINAL AORTIC ANEURYSM REPAIR    . CARDIAC CATHETERIZATION  2001  . [place]  . CHOLECYSTECTOMY    .  FRACTURE SURGERY     shoulder replacement  . ORIF DISTAL RADIUS FRACTURE      Current Outpatient Prescriptions  Medication Sig Dispense Refill  . albuterol (PROVENTIL HFA;VENTOLIN HFA) 108 (90 Base) MCG/ACT inhaler Inhale 2 puffs into the lungs every 4 (four) hours as needed for wheezing or shortness of breath.    . ALPRAZolam (XANAX) 0.5 MG tablet Take 1 tablet (0.5 mg total) by mouth daily as needed for anxiety. 30 tablet 0  . aspirin EC 81 MG tablet Take 81 mg by mouth at bedtime.    Marland Kitchen atorvastatin (LIPITOR) 20 MG tablet Take 1 tablet (20 mg total) by mouth daily. 90 tablet 3  . carvedilol (COREG) 6.25 MG tablet Take 1 tablet (6.25 mg total) by mouth 2 (two) times daily with a meal. 180 tablet 3  . cholecalciferol (VITAMIN D) 1000 units tablet Take 2,000 Units by mouth daily.    . cyclobenzaprine (FLEXERIL) 10 MG tablet Take 0.5-1 tablets (5-10 mg total) by mouth 3 (three) times daily as needed. For headache 30 tablet 1  . furosemide (LASIX) 20 MG tablet Take 1 tablet (20 mg total) by mouth daily. 90 tablet 3  . Glucosamine-Chondroitin (OSTEO BI-FLEX REGULAR STRENGTH PO) Take by mouth daily.    . hydrocortisone 2.5 % cream Apply topically 2 (two) times daily. For maximum 7 days. 28 g 0  . lisinopril (PRINIVIL,ZESTRIL) 5 MG tablet TAKE 1/2 TABLET (2.5 MG TOTAL) BY MOUTH DAILY. 45 tablet 3  . magnesium gluconate (MAGONATE) 500 MG tablet Take 500 mg by mouth at bedtime.    . Melatonin 3 MG CAPS Take 1 capsule by mouth at bedtime as needed.    . Misc Natural Products (TART CHERRY ADVANCED) CAPS Take 1 capsule by mouth daily.    . naproxen sodium (ANAPROX) 220 MG tablet Take 440 mg by mouth 2 (two) times daily as needed (for headaches/pain).    . nitroGLYCERIN (NITROSTAT) 0.4 MG SL tablet Place 1 tablet (0.4 mg total) under the tongue every 5 (five) minutes as needed for chest pain. 25 tablet 3  . omeprazole (PRILOSEC) 40 MG capsule Take 1 capsule (40 mg total) by mouth daily. 90 capsule 2  .  potassium chloride (K-DUR) 10 MEQ tablet Take 2 tablets (20 mEq total) by mouth daily. 180 tablet 3  . promethazine (PHENERGAN) 25 MG tablet Take 0.5 tablets (12.5 mg total) by mouth every 8 (eight) hours as needed for nausea. 30 tablet 1  . venlafaxine XR (EFFEXOR-XR) 75 MG 24 hr capsule Take 75 mg by mouth daily with breakfast.     No current facility-administered medications for this visit.      Allergies:   Alendronate sodium; Ginzing [ginseng]; Hydrocod polst-cpm polst er; Hydrocodone-acetaminophen; Oxycodone; Peanut-containing drug products; Sumatriptan succinate; Hydrocodone-guaifenesin; Prednisone; and Tape   Social History:  The patient  reports that she quit smoking about 38 years ago. She has never used smokeless tobacco. She reports that she drinks alcohol. She reports that she does not use drugs.   Family History:   family history includes Aneurysm in her paternal grandmother; Arthritis in her paternal grandfather, paternal grandmother, sister, and sister; Colon cancer in her maternal grandmother; Depression in  her mother; Diabetes in her paternal grandmother and sister; Hypertension in her mother; Irritable bowel syndrome in her mother; Lymphoma in her maternal grandmother; Nephrolithiasis in her sister; Osteoporosis in her mother and sister; Stroke in her maternal grandmother and mother.    Review of Systems: Review of Systems  Constitutional: Negative.   Respiratory: Negative.   Cardiovascular: Negative.   Gastrointestinal: Negative.   Musculoskeletal: Negative.   Neurological: Negative.   Psychiatric/Behavioral: Negative.   All other systems reviewed and are negative.    PHYSICAL EXAM: VS:  BP 104/68 (BP Location: Left Arm, Patient Position: Sitting, Cuff Size: Normal)   Pulse 68   Ht 5\' 5"  (1.651 m)   Wt 195 lb 12 oz (88.8 kg)   BMI 32.57 kg/m  , BMI Body mass index is 32.57 kg/m. GEN: Well nourished, well developed, in no acute distress  HEENT: normal  Neck:  no JVD, carotid bruits, or masses Cardiac: RRR; no murmurs, rubs, or gallops,no edema  Respiratory:  clear to auscultation bilaterally, normal work of breathing GI: soft, nontender, nondistended, + BS MS: no deformity or atrophy  Skin: warm and dry, no rash Neuro:  Strength and sensation are intact Psych: euthymic mood, full affect    Recent Labs: 09/02/2016: Magnesium 2.1 11/23/2016: ALT 22; BUN 16; Creatinine, Ser 0.90; Hemoglobin 13.3; Platelets 222.0; Potassium 4.7; Sodium 140; TSH 0.90    Lipid Panel Lab Results  Component Value Date   CHOL 132 11/23/2016   HDL 45.80 11/23/2016   LDLCALC 69 11/23/2016   TRIG 86.0 11/23/2016      Wt Readings from Last 3 Encounters:  04/26/17 195 lb 12 oz (88.8 kg)  04/13/17 189 lb (85.7 kg)  03/16/17 190 lb (86.2 kg)       ASSESSMENT AND PLAN:  Pure hypercholesterolemia Cholesterol is at goal on the current lipid regimen. No changes to the medications were made.  Essential hypertension - Plan: EKG 12-Lead Blood pressure is well controlled on today's visit. No changes made to the medications.  CORONARY ARTERY BYPASS GRAFT, HX OF - Plan: EKG 12-Lead Currently with no symptoms of angina. No further workup at this time. Continue current medication regimen.  Dissection of thoracoabdominal aorta (HCC) Followed by vascular surgeryin [place], Dr. Donnetta Hutching   Repeat scan scheduled for 2020   Coronary artery disease involving native coronary artery of native heart without angina pectoris - Plan: EKG 12-Lead Currently with no symptoms of angina. No further workup at this time. Continue current medication regimen.  H/O dissecting abdominal aortic aneurysm repair - Plan: EKG 12-Lead Previous history discussed with her in detail   Total encounter time more than 25 minutes  Greater than 50% was spent in counseling and coordination of care with the patient   Disposition:   F/U  6 months   Orders Placed This Encounter  Procedures  .  EKG 12-Lead     Signed, Esmond Plants, M.D., Ph.D. 04/26/2017  [facility] [place], [place] [phone]

## 2017-04-25 ENCOUNTER — Telehealth: Payer: Self-pay | Admitting: Cardiovascular Disease

## 2017-04-25 DIAGNOSIS — M25561 Pain in right knee: Secondary | ICD-10-CM | POA: Diagnosis not present

## 2017-04-25 DIAGNOSIS — M1711 Unilateral primary osteoarthritis, right knee: Secondary | ICD-10-CM | POA: Diagnosis not present

## 2017-04-25 NOTE — Telephone Encounter (Addendum)
I have left pt a message to call back to see what she is taking. She has an appt w/ Dr. [name] tomorrow.

## 2017-04-25 NOTE — Telephone Encounter (Signed)
Pharmacy states they needs rx for potassium medication.

## 2017-04-26 ENCOUNTER — Ambulatory Visit (INDEPENDENT_AMBULATORY_CARE_PROVIDER_SITE_OTHER): Payer: Medicare HMO | Admitting: Cardiovascular Disease

## 2017-04-26 ENCOUNTER — Encounter: Payer: Self-pay | Admitting: Cardiovascular Disease

## 2017-04-26 VITALS — BP 104/68 | HR 68 | Ht 65.0 in | Wt 195.8 lb

## 2017-04-26 DIAGNOSIS — I7101 Dissection of ascending aorta: Secondary | ICD-10-CM

## 2017-04-26 DIAGNOSIS — I5042 Chronic combined systolic (congestive) and diastolic (congestive) heart failure: Secondary | ICD-10-CM | POA: Diagnosis not present

## 2017-04-26 DIAGNOSIS — Z952 Presence of prosthetic heart valve: Secondary | ICD-10-CM | POA: Diagnosis not present

## 2017-04-26 DIAGNOSIS — Z951 Presence of aortocoronary bypass graft: Secondary | ICD-10-CM | POA: Diagnosis not present

## 2017-04-26 MED ORDER — FUROSEMIDE 20 MG PO TABS: 20.0000 mg | ORAL_TABLET | Freq: Every day | ORAL | 3 refills | Status: DC

## 2017-04-26 MED ORDER — POTASSIUM CHLORIDE ER 10 MEQ PO TBCR: 20.0000 meq | EXTENDED_RELEASE_TABLET | Freq: Every day | ORAL | 3 refills | Status: DC

## 2017-04-26 MED ORDER — ATORVASTATIN CALCIUM 20 MG PO TABS: 20.0000 mg | ORAL_TABLET | Freq: Every day | ORAL | 3 refills | Status: DC

## 2017-04-26 MED ORDER — LISINOPRIL 5 MG PO TABS: ORAL_TABLET | ORAL | 3 refills | Status: DC

## 2017-04-26 MED ORDER — NITROGLYCERIN 0.4 MG SL SUBL: 0.4000 mg | SUBLINGUAL_TABLET | SUBLINGUAL | 3 refills | Status: DC | PRN

## 2017-04-26 NOTE — Patient Instructions (Signed)
Medication Instructions:   No medication changes made  Labwork:  No new labs needed  Testing/Procedures:  No further testing at this time   I recommend watching educational videos on topics of interest to you at:       [email or web address]  Enter code: [email or web address]    Follow-Up: It was a pleasure seeing you in the office today. Please call us if you have new issues that need to be addressed before your next appt.  [phone]  Your physician wants you to follow-up in: 6 months.  You will receive a reminder letter in the mail two months in advance. If you don't receive a letter, please call our office to schedule the follow-up appointment.  If you need a refill on your cardiac medications before your next appointment, please call your pharmacy.     

## 2017-04-29 ENCOUNTER — Ambulatory Visit (INDEPENDENT_AMBULATORY_CARE_PROVIDER_SITE_OTHER): Payer: Medicare HMO | Admitting: Internal Medicine

## 2017-04-29 ENCOUNTER — Encounter: Payer: Self-pay | Admitting: Internal Medicine

## 2017-04-29 VITALS — BP 100/64 | HR 84 | Ht 65.0 in | Wt 189.1 lb

## 2017-04-29 DIAGNOSIS — K641 Second degree hemorrhoids: Secondary | ICD-10-CM | POA: Diagnosis not present

## 2017-04-29 DIAGNOSIS — K645 Perianal venous thrombosis: Secondary | ICD-10-CM | POA: Diagnosis not present

## 2017-04-29 NOTE — Patient Instructions (Addendum)
HEMORRHOID BANDING PROCEDURE    FOLLOW-UP CARE   1. The procedure you have had should have been relatively painless since the banding of the area involved does not have nerve endings and there is no pain sensation.  The rubber band cuts off the blood supply to the hemorrhoid and the band may fall off as soon as 48 hours after the banding (the band may occasionally be seen in the toilet bowl following a bowel movement). You may notice a temporary feeling of fullness in the rectum which should respond adequately to plain Tylenol or Motrin.  2. Following the banding, avoid strenuous exercise that evening and resume full activity the next day.  A sitz bath (soaking in a warm tub) or bidet is soothing, and can be useful for cleansing the area after bowel movements.     3. To avoid constipation, take two tablespoons of natural wheat bran, natural oat bran, flax, Benefiber or any over the counter fiber supplement and increase your water intake to 7-8 glasses daily.    4. Unless you have been prescribed anorectal medication, do not put anything inside your rectum for two weeks: No suppositories, enemas, fingers, etc.  5. Occasionally, you may have more bleeding than usual after the banding procedure.  This is often from the untreated hemorrhoids rather than the treated one.  Don't be concerned if there is a tablespoon or so of blood.  If there is more blood than this, lie flat with your bottom higher than your head and apply an ice pack to the area. If the bleeding does not stop within a half an hour or if you feel faint, call our office at [phone] or go to the emergency room.  6. Problems are not common; however, if there is a substantial amount of bleeding, severe pain, chills, fever or difficulty passing urine (very rare) or other problems, you should call us at [phone] or report to the nearest emergency room.  7. Do not stay seated continuously for more than 2-3 hours for a day or two  after the procedure.  Tighten your buttock muscles 10-15 times every two hours and take 10-15 deep breaths every 1-2 hours.  Do not spend more than a few minutes on the toilet if you cannot empty your bowel; instead re-visit the toilet at a later time.    Follow up with Dr Carlean Purl for additional banding on 06/07/17 at 3:15pm.   Continue to soak in a sitz bath.   I appreciate the opportunity to care for you. Silvano Rusk, MD, [facility]

## 2017-04-29 NOTE — Progress Notes (Signed)
   Rachel Torres 61 y.o. 05-29-1956 [id number] Referred by: Elby Beck, FNP  Assessment & Plan:   Encounter Diagnoses  Name Primary?  . Prolapsed internal hemorrhoids, grade 2 Yes  . Thrombosed external hemorrhoid    Banding started today with the right anterior internal hemorrhoid. Conservative care and healing of the thrombosed left lateral external hemorrhoid. Return to clinic in a few weeks for repeat banding.  CC: Tower, Wynelle Fanny, MD  Subjective:   Chief Complaint:Hemorrhoids  HPI Very nice lady here because of recurrent hemorrhoid symptoms. At least twice a year she'll have flares for a month or so, anal swelling pain irritation discomfort and bleeding. Also we'll get itching. She suffered with the hemorrhoids for many years and has had irritable bowel syndrome in a mixed pattern though that is not really that symptomatic now but it was years ago. More recently she saw primary care and had a thrombosed external hemorrhoid that was several days old. She was referred for evaluation. Last colonoscopy at [facility] 2014 negative. Medications, allergies, past medical history, past surgical history, family history and social history are reviewed and updated in the EMR.   Review of Systems All other review of systems negative  Objective:   Physical Exam BP 100/64 (BP Location: Left Arm, Patient Position: Sitting, Cuff Size: Normal)   Pulse 84   Ht 5\' 5"  (1.651 m) Comment: height measured without shoes  Wt 189 lb 2 oz (85.8 kg)   BMI 31.47 kg/m  No acute distress  Patti Martinique, CMA present.   Rectal - thrombosed LL external hemorrhoid is visible it is firm it is the size of a been her large daily. It is easily reducible is nontender today. There are some small anal tags as well very fleshy. Digital exam is nontender with no mass.  Anoscopy was performed with the patient in the left lateral decubitus position while a chaperone was present and revealed Gr 2 all positions  internal w/ thrombosed LL external  PROCEDURE NOTE: Hemorrhoidal ligation The patient presents with symptomatic grade 2  hemorrhoids, requesting rubber band ligation of his/her hemorrhoidal disease.  All risks, benefits and alternative forms of therapy were described and informed consent was obtained.   The anorectum was pre-medicated with 0.125% nitroglycerin and 5% lidocaine topical The decision was made to band the right anterior internal hemorrhoid, and the [place] was used to perform band ligation without complication.  Digital anorectal examination was then performed to assure proper positioning of the band, and to adjust the banded tissue as required.  The patient was discharged home without pain or other issues.  Dietary and behavioral recommendations were given and along with follow-up instructions.     The following adjunctive treatments were recommended:  Vicente Males fiber 1 tablespoon daily  The patient will return in a few weeks for  follow-up and possible additional banding as required. No complications were encountered and the patient tolerated the procedure well.

## 2017-05-09 ENCOUNTER — Ambulatory Visit (INDEPENDENT_AMBULATORY_CARE_PROVIDER_SITE_OTHER): Payer: Medicare HMO | Admitting: Psychology

## 2017-05-09 DIAGNOSIS — F4323 Adjustment disorder with mixed anxiety and depressed mood: Secondary | ICD-10-CM | POA: Diagnosis not present

## 2017-05-25 ENCOUNTER — Ambulatory Visit: Payer: Self-pay | Admitting: Internal Medicine

## 2017-05-31 ENCOUNTER — Ambulatory Visit (INDEPENDENT_AMBULATORY_CARE_PROVIDER_SITE_OTHER): Payer: Medicare HMO | Admitting: Psychology

## 2017-05-31 DIAGNOSIS — L249 Irritant contact dermatitis, unspecified cause: Secondary | ICD-10-CM | POA: Diagnosis not present

## 2017-05-31 DIAGNOSIS — F4323 Adjustment disorder with mixed anxiety and depressed mood: Secondary | ICD-10-CM | POA: Diagnosis not present

## 2017-05-31 DIAGNOSIS — Z872 Personal history of diseases of the skin and subcutaneous tissue: Secondary | ICD-10-CM | POA: Diagnosis not present

## 2017-06-03 ENCOUNTER — Encounter: Payer: Self-pay | Admitting: Cardiovascular Disease

## 2017-06-07 ENCOUNTER — Telehealth: Payer: Self-pay | Admitting: Internal Medicine

## 2017-06-07 ENCOUNTER — Encounter: Payer: Self-pay | Admitting: Internal Medicine

## 2017-06-07 NOTE — Telephone Encounter (Signed)
Okay, no charge 

## 2017-06-09 ENCOUNTER — Ambulatory Visit (INDEPENDENT_AMBULATORY_CARE_PROVIDER_SITE_OTHER): Payer: Medicare HMO | Admitting: Psychology

## 2017-06-09 DIAGNOSIS — F4323 Adjustment disorder with mixed anxiety and depressed mood: Secondary | ICD-10-CM | POA: Diagnosis not present

## 2017-06-15 ENCOUNTER — Telehealth: Payer: Self-pay | Admitting: Cardiovascular Disease

## 2017-06-15 NOTE — Telephone Encounter (Signed)
Pt states she needs a letter for disability stating that she is unable to work due to her cardiac history.

## 2017-06-15 NOTE — Telephone Encounter (Signed)
Spoke with patient at length and let her know that Dr. [name] is out of the office this week. She sent mychart message to him and it appears that she sent in scan of documents. Informed her that Dr. [name] responded to her day before yesterday letting her know that usually they require [place] to fill out specific forms. She said that form was attached to that message and reviewed it with her. It did not require anything specific from our office at this time and just needs her to complete those forms, sign, and return to them. Let her know that if they should require anything specific then they would send it to [place] to complete. She was agreeable and will let us know if she needs anything. She was so appreciative for our conversation and had no further questions at this time.

## 2017-06-15 NOTE — Telephone Encounter (Signed)
Left voicemail message to call back  

## 2017-06-16 ENCOUNTER — Ambulatory Visit: Payer: Medicare HMO | Admitting: Psychology

## 2017-06-23 ENCOUNTER — Ambulatory Visit (INDEPENDENT_AMBULATORY_CARE_PROVIDER_SITE_OTHER): Payer: Medicare HMO | Admitting: Psychology

## 2017-06-23 DIAGNOSIS — F4323 Adjustment disorder with mixed anxiety and depressed mood: Secondary | ICD-10-CM | POA: Diagnosis not present

## 2017-06-28 ENCOUNTER — Ambulatory Visit (INDEPENDENT_AMBULATORY_CARE_PROVIDER_SITE_OTHER): Payer: Medicare HMO | Admitting: Family Medicine

## 2017-06-28 ENCOUNTER — Encounter: Payer: Self-pay | Admitting: Family Medicine

## 2017-06-28 VITALS — BP 124/72 | HR 71 | Temp 97.8°F | Ht 65.0 in | Wt 198.5 lb

## 2017-06-28 DIAGNOSIS — E669 Obesity, unspecified: Secondary | ICD-10-CM

## 2017-06-28 DIAGNOSIS — I10 Essential (primary) hypertension: Secondary | ICD-10-CM

## 2017-06-28 DIAGNOSIS — G43009 Migraine without aura, not intractable, without status migrainosus: Secondary | ICD-10-CM | POA: Diagnosis not present

## 2017-06-28 DIAGNOSIS — F418 Other specified anxiety disorders: Secondary | ICD-10-CM | POA: Diagnosis not present

## 2017-06-28 MED ORDER — CYCLOBENZAPRINE HCL 10 MG PO TABS: 5.0000 mg | ORAL_TABLET | Freq: Three times a day (TID) | ORAL | 5 refills | Status: DC | PRN

## 2017-06-28 MED ORDER — PROMETHAZINE HCL 25 MG PO TABS: 12.5000 mg | ORAL_TABLET | Freq: Three times a day (TID) | ORAL | 5 refills | Status: DC | PRN

## 2017-06-28 MED ORDER — ALPRAZOLAM 0.5 MG PO TABS: 0.5000 mg | ORAL_TABLET | Freq: Every day | ORAL | 0 refills | Status: DC | PRN

## 2017-06-28 MED ORDER — VENLAFAXINE HCL ER 150 MG PO CP24: 150.0000 mg | ORAL_CAPSULE | Freq: Every day | ORAL | 3 refills | Status: DC

## 2017-06-28 NOTE — Assessment & Plan Note (Signed)
Wt up with stress eating  Will continue to treat mood issues and she is working with a counselor Discussed how this problem influences overall health and the risks it imposes  Reviewed plan for weight loss with lower calorie diet (via better food choices and also portion control or program like weight watchers) and exercise building up to or more than 30 minutes 5 days per week including some aerobic activity

## 2017-06-28 NOTE — Progress Notes (Signed)
Subjective:    Patient ID: Rachel Torres, female    DOB: [birth year], 61 y.o.   MRN: [id number]  HPI Here for f/u of chronic medical problems   Taking care of her husband  (he is getting worse)-Parkinson's  Hoping to get financial help from the [place] for disability  He can no longer drive  Multiple falls and concussions - cannot leave him alone  Caring for her mother   Also daughter in law and grandchild are in the house She and her fiancee are helping with her husband Looking for other sources of help also    She is learning to say "no" --doing a better job with that  Everyone dumps on her   Wt Readings from Last 3 Encounters:  06/28/17 198 lb 8 oz (90 kg)  04/29/17 189 lb 2 oz (85.8 kg)  04/26/17 195 lb 12 oz (88.8 kg)  she is a stress eater  bmi 33.0   bp is stable today  No cp or palpitations or headaches or edema  No side effects to medicines  BP Readings from Last 3 Encounters:  06/28/17 124/72  04/29/17 100/64  04/26/17 104/68    On coreg and lasix and lisinopril and K   Hx of depression/anxiety  Saw psychiatry in the past  On effexor xr and xanax prn - finds herself needing 5 xanax in the last week and 1/2    More anxiety attacks lately   More migraines lately  Phenergan Flexeril  Needs prior auth for both and refills   Sometimes she cannot take her medicines due to need to take her husband to the doctor     She is seeing counselor - Pervis Hocking- she is helpful  Trying to see her once weekly when she can   BP Readings from Last 3 Encounters:  06/28/17 124/72  04/29/17 100/64  04/26/17 104/68    Patient Active Problem List   Diagnosis Date Noted  . Type 1 dissection of ascending aorta ([place]) 04/23/2017  . HPV (human papilloma virus) infection 12/01/2016  . Screening mammogram, encounter for 11/29/2016  . Estrogen deficiency 11/29/2016  . Encounter for routine gynecological examination 11/29/2016  . Grade III hemorrhoids 10/06/2016  .  Tachycardia 08/14/2016  . Hemorrhoids 08/13/2016  . Atypical migraine 08/03/2016  . Sweating 07/12/2016  . CAD (coronary artery disease)   . AAA (abdominal aortic aneurysm) ([place])   . Chronic combined systolic (congestive) and diastolic (congestive) heart failure ([place])   . Hyperglycemia 02/11/2016  . Generalized anxiety disorder 12/08/2015  . Panic attacks 07/28/2015  . Sensorineural hearing loss 03/27/2013  . Aortic dissection ([place]) 05/12/2012  . Obesity (BMI 30.0-34.9) 11/21/2011  . Routine general medical examination at a health care facility 09/20/2011  . S/P AVR (aortic valve replacement) 01/07/2011  . CORONARY ARTERY BYPASS GRAFT, HX OF 01/07/2011  . Arthritis 12/20/2010  . H/O dissecting abdominal aortic aneurysm repair 08/24/2010  . IRRITABLE BOWEL SYNDROME 09/09/2009  . Obstructive sleep apnea 08/04/2009  . External hemorrhoid 06/26/2009  . Osteoporosis 01/09/2009  . Hyperlipidemia 07/26/2007  . Depression with anxiety 07/26/2007  . Essential hypertension 07/26/2007  . Allergic rhinitis 07/26/2007  . Asthma 07/26/2007  . GERD 07/26/2007  . Osteoarthritis 07/26/2007  . Ocular migraine 07/26/2007  . Mild intermittent asthma without complication 38/45/3646   Past Medical History:  Diagnosis Date  . AAA (abdominal aortic aneurysm) ([place])    a. Chronic w/o evidence of aneurysmal dil on CTA 01/2016.  Marland Kitchen Allergy   .  Anemia   . Anxiety   . Asthma   . CAD (coronary artery disease)    a. 08/2010 s/p CABG x 1 (VG->RCA) @ time of Ao dissection repair; b. 01/2016 Lexiscan MV: mid antsept/apical defect w/ ? peri-infarct ischemia-->likely attenuation-->Med Rx.  . Chronic combined systolic (congestive) and diastolic (congestive) heart failure ([place])    a. 2012 EF 30-35%; b. 04/2015 EF 40-45%; c. 01/2016 Echo: Ef 55-65%, nl AoV bioprosthesis, nl RV, nl PASP.  Marland Kitchen Depression   . Gallbladder sludge   . GERD (gastroesophageal reflux disease)   . History of Bicuspid Aortic Valve    a. 08/2010  s/p AVR @ time of Ao dissection repair; b. 01/2016 Echo: Ef 55-65%, nl AoV bioprosthesis, nl RV, nl PASP.  Marland Kitchen Hx of repair of dissecting thoracic aortic aneurysm, [place] type A    a. 08/2010 s/p repair with AVR and VG->RCA; b. 01/2016 CTA: stable appearance of Asc Thoracic Aortic graft. Opacification of flase lumen of chronic abd Ao dissection w/ retrograde flow through lumbar arteries. No aneurysmal dil of flase lumen.  . Hyperlipidemia   . Hypertensive heart disease   . IBS (irritable bowel syndrome)   . Internal hemorrhoids   . Marfan syndrome    pt denies  . Migraine   . Obstructive sleep apnea   . Osteoporosis   . Pneumonia   . RA (rheumatoid arthritis) ([place])    a. Followed by Dr. [name]   Past Surgical History:  Procedure Laterality Date  . ABDOMINAL AORTIC ANEURYSM REPAIR    . CARDIAC CATHETERIZATION  2001  . [place]  . CHOLECYSTECTOMY    . COLONOSCOPY    . FRACTURE SURGERY Right    shoulder replacement  . HEMORRHOID BANDING    . ORIF DISTAL RADIUS FRACTURE Left   . UPPER GASTROINTESTINAL ENDOSCOPY     Social History  Substance Use Topics  . Smoking status: Former Smoker    Quit date: 12/20/1978  . Smokeless tobacco: Never Used  . Alcohol use 0.0 oz/week     Comment: occassional   Family History  Problem Relation Age of Onset  . Hypertension Mother   . Depression Mother   . Osteoporosis Mother   . Stroke Mother   . Irritable bowel syndrome Mother   . Asthma Mother   . Arthritis Sister   . Diabetes Sister   . Heart disease Sister   . Asthma Sister   . Migraines Sister   . Nephrolithiasis Sister   . Osteoporosis Sister   . Arthritis Sister   . Asthma Sister   . Migraines Sister   . Other Father 85  . Stroke Maternal Grandmother   . Colon cancer Maternal Grandmother   . Lymphoma Maternal Grandmother   . Migraines Maternal Grandmother   . Aneurysm Paternal Grandmother        brain  . Diabetes Paternal Grandmother   . Arthritis  Paternal Grandmother   . Arthritis Paternal Grandfather   . Diabetes Paternal Grandfather   . Other Brother        prediabeties  . Asthma Brother   . Migraines Brother   . Lung cancer Maternal Grandfather   . Melanoma Maternal Grandfather    Allergies  Allergen Reactions  . Fosamax [Alendronate Sodium]     GI upset   . Ginzing [Ginseng] Other (See Comments)    Hyper and jitery   . Hydrocod Polst-Cpm Polst Er Itching  . Hydrocodone-Acetaminophen Itching  . Imitrex [Sumatriptan]  Contraindicated w/ heart condition  . Oxycodone Itching  . Peanut-Containing Drug Products Other (See Comments)    Reaction:  Migraines   . Hydrocodone-Guaifenesin Itching  . Prednisone Rash  . Tape Itching   Current Outpatient Prescriptions on File Prior to Visit  Medication Sig Dispense Refill  . albuterol (PROVENTIL HFA;VENTOLIN HFA) 108 (90 Base) MCG/ACT inhaler Inhale 2 puffs into the lungs every 4 (four) hours as needed for wheezing or shortness of breath.    Marland Kitchen aspirin EC 81 MG tablet Take 81 mg by mouth at bedtime.    Marland Kitchen atorvastatin (LIPITOR) 20 MG tablet Take 1 tablet (20 mg total) by mouth daily. 90 tablet 3  . carvedilol (COREG) 6.25 MG tablet Take 1 tablet (6.25 mg total) by mouth 2 (two) times daily with a meal. 180 tablet 3  . cholecalciferol (VITAMIN D) 1000 units tablet Take 2,000 Units by mouth daily.    . furosemide (LASIX) 20 MG tablet Take 1 tablet (20 mg total) by mouth daily. 90 tablet 3  . Glucosamine-Chondroitin (OSTEO BI-FLEX REGULAR STRENGTH PO) Take by mouth daily.    . hydrocortisone 2.5 % cream Apply topically 2 (two) times daily. For maximum 7 days. 28 g 0  . lisinopril (PRINIVIL,ZESTRIL) 5 MG tablet TAKE 1/2 TABLET (2.5 MG TOTAL) BY MOUTH DAILY. 45 tablet 3  . magnesium gluconate (MAGONATE) 500 MG tablet Take 500 mg by mouth at bedtime.    . Melatonin 3 MG CAPS Take 1 capsule by mouth at bedtime as needed.    . Misc Natural Products (TART CHERRY ADVANCED) CAPS Take 1  capsule by mouth daily.    . naproxen sodium (ANAPROX) 220 MG tablet Take 440 mg by mouth 2 (two) times daily as needed (for headaches/pain).    . nitroGLYCERIN (NITROSTAT) 0.4 MG SL tablet Place 1 tablet (0.4 mg total) under the tongue every 5 (five) minutes as needed for chest pain. 25 tablet 3  . omeprazole (PRILOSEC) 40 MG capsule Take 1 capsule (40 mg total) by mouth daily. 90 capsule 2  . potassium chloride (K-DUR) 10 MEQ tablet Take 2 tablets (20 mEq total) by mouth daily. 180 tablet 3   No current facility-administered medications on file prior to visit.     Review of Systems Review of Systems  Constitutional: Negative for fever, appetite change, fatigue and unexpected weight change.  Eyes: Negative for pain and visual disturbance.  Respiratory: Negative for cough and shortness of breath.   Cardiovascular: Negative for cp or palpitations    Gastrointestinal: Negative for nausea, diarrhea and constipation.  Genitourinary: Negative for urgency and frequency.  Skin: Negative for pallor or rash   Neurological: Negative for weakness, light-headedness, numbness and pos for migraine headaches.  Hematological: Negative for adenopathy. Does not bruise/bleed easily.  Psychiatric/Behavioral: pos for controlled depression and worse anxiety with panic attacks / neg for SI         Objective:   Physical Exam  Constitutional: She appears well-developed and well-nourished. No distress.  obese and well appearing   HENT:  Head: Normocephalic and atraumatic.  Mouth/Throat: Oropharynx is clear and moist.  Eyes: Conjunctivae and EOM are normal. Pupils are equal, round, and reactive to light.  Neck: Normal range of motion. Neck supple. No JVD present. Carotid bruit is not present. No thyromegaly present.  Cardiovascular: Normal rate, regular rhythm and intact distal pulses.  Exam reveals no gallop.   Murmur heard. Pulmonary/Chest: Effort normal and breath sounds normal. No respiratory distress.  She has no  wheezes. She has no rales.  No crackles  Abdominal: Soft. Bowel sounds are normal. She exhibits no distension, no abdominal bruit and no mass. There is no tenderness.  Musculoskeletal: She exhibits no edema.  Lymphadenopathy:    She has no cervical adenopathy.  Neurological: She is alert. She has normal reflexes. No cranial nerve deficit. She exhibits normal muscle tone. Coordination normal.  Mild essential tremor L hand  Skin: Skin is warm and dry. No rash noted. No pallor.  Psychiatric: Her speech is normal and behavior is normal. Thought content normal. Her mood appears anxious. Her affect is not angry, not blunt, not labile and not inappropriate. Thought content is not paranoid. Cognition and memory are normal. She does not exhibit a depressed mood. She expresses no homicidal and no suicidal ideation.  Attentive Candid re: stressors and symptoms  Good insight           Assessment & Plan:   Problem List Items Addressed This Visit      Cardiovascular and Mediastinum   Atypical migraine    Exacerbated but stress lately  Refilled flexeril and phenergan for prn use -will likely need prior auth  Also inc her effexor to 150 xr daily -for this and mood  She will update if worse or no improvement       Relevant Medications   venlafaxine XR (EFFEXOR-XR) 150 MG 24 hr capsule   cyclobenzaprine (FLEXERIL) 10 MG tablet   Essential hypertension - Primary    bp in fair control at this time  BP Readings from Last 1 Encounters:  06/28/17 124/72   No changes needed Disc lifstyle change with low sodium diet and exercise          Other   Depression with anxiety    More anxiety lately with caregiver stress (husb with parkinsons and elderly mother) Seeing counselor Reviewed stressors/ coping techniques/symptoms/ support sources/ tx options and side effects in detail today Seen in the past by psychiatry and does well with effexor xr -will inc dose to 150 daily  Discussed  expectations of SNRI medication including time to effectiveness and mechanism of action, also poss of side effects (early and late)- including mental fuzziness, weight or appetite change, nausea and poss of worse dep or anxiety (even suicidal thoughts)  Pt voiced understanding and will stop med and update if this occurs  >25 minutes spent in face to face time with patient, >50% spent in counselling or coordination of care-including tx choices/ lifestyle change and effort for self care/ avail of help care giving  She will continue weekly counseling also        Obesity (BMI 30.0-34.9)    Wt up with stress eating  Will continue to treat mood issues and she is working with a counselor Discussed how this problem influences overall health and the risks it imposes  Reviewed plan for weight loss with lower calorie diet (via better food choices and also portion control or program like weight watchers) and exercise building up to or more than 30 minutes 5 days per week including some aerobic activity

## 2017-06-28 NOTE — Patient Instructions (Addendum)
Go up on your effexor xr to 150 mg daily  If any problems or side effects please alert me  If you feel worse please alert me also  Use xanax as needed with caution   Make sure to drink enough fluids for headache prevention (64 oz per day)  Try to take care of yourself   Continue counseling

## 2017-06-28 NOTE — Assessment & Plan Note (Signed)
bp in fair control at this time  BP Readings from Last 1 Encounters:  06/28/17 124/72   No changes needed Disc lifstyle change with low sodium diet and exercise

## 2017-06-28 NOTE — Assessment & Plan Note (Signed)
Exacerbated but stress lately  Refilled flexeril and phenergan for prn use -will likely need prior auth  Also inc her effexor to 150 xr daily -for this and mood  She will update if worse or no improvement

## 2017-06-28 NOTE — Assessment & Plan Note (Signed)
More anxiety lately with caregiver stress (husb with parkinsons and elderly mother) Seeing counselor Reviewed stressors/ coping techniques/symptoms/ support sources/ tx options and side effects in detail today Seen in the past by psychiatry and does well with effexor xr -will inc dose to 150 daily  Discussed expectations of SNRI medication including time to effectiveness and mechanism of action, also poss of side effects (early and late)- including mental fuzziness, weight or appetite change, nausea and poss of worse dep or anxiety (even suicidal thoughts)  Pt voiced understanding and will stop med and update if this occurs  >25 minutes spent in face to face time with patient, >50% spent in counselling or coordination of care-including tx choices/ lifestyle change and effort for self care/ avail of help care giving  She will continue weekly counseling also

## 2017-06-30 ENCOUNTER — Ambulatory Visit (INDEPENDENT_AMBULATORY_CARE_PROVIDER_SITE_OTHER): Payer: Medicare HMO | Admitting: Psychology

## 2017-06-30 DIAGNOSIS — F4323 Adjustment disorder with mixed anxiety and depressed mood: Secondary | ICD-10-CM

## 2017-07-06 ENCOUNTER — Ambulatory Visit (INDEPENDENT_AMBULATORY_CARE_PROVIDER_SITE_OTHER): Payer: Medicare HMO | Admitting: Psychology

## 2017-07-06 DIAGNOSIS — F4323 Adjustment disorder with mixed anxiety and depressed mood: Secondary | ICD-10-CM

## 2017-07-11 ENCOUNTER — Ambulatory Visit (INDEPENDENT_AMBULATORY_CARE_PROVIDER_SITE_OTHER): Payer: Medicare HMO | Admitting: Psychology

## 2017-07-11 DIAGNOSIS — F4323 Adjustment disorder with mixed anxiety and depressed mood: Secondary | ICD-10-CM

## 2017-07-27 ENCOUNTER — Other Ambulatory Visit: Payer: Self-pay | Admitting: Physician Assistant

## 2017-08-02 ENCOUNTER — Ambulatory Visit (INDEPENDENT_AMBULATORY_CARE_PROVIDER_SITE_OTHER): Payer: Medicare HMO | Admitting: Psychology

## 2017-08-02 DIAGNOSIS — F4323 Adjustment disorder with mixed anxiety and depressed mood: Secondary | ICD-10-CM | POA: Diagnosis not present

## 2017-08-09 ENCOUNTER — Ambulatory Visit: Payer: Medicare HMO | Admitting: Psychology

## 2017-08-15 ENCOUNTER — Ambulatory Visit (INDEPENDENT_AMBULATORY_CARE_PROVIDER_SITE_OTHER): Payer: Medicare HMO | Admitting: Psychology

## 2017-08-15 DIAGNOSIS — F4323 Adjustment disorder with mixed anxiety and depressed mood: Secondary | ICD-10-CM | POA: Diagnosis not present

## 2017-08-17 DIAGNOSIS — L218 Other seborrheic dermatitis: Secondary | ICD-10-CM | POA: Diagnosis not present

## 2017-08-17 DIAGNOSIS — L905 Scar conditions and fibrosis of skin: Secondary | ICD-10-CM | POA: Diagnosis not present

## 2017-08-23 ENCOUNTER — Ambulatory Visit (INDEPENDENT_AMBULATORY_CARE_PROVIDER_SITE_OTHER): Payer: Medicare HMO | Admitting: Psychology

## 2017-08-23 DIAGNOSIS — F4323 Adjustment disorder with mixed anxiety and depressed mood: Secondary | ICD-10-CM

## 2017-08-29 ENCOUNTER — Ambulatory Visit (INDEPENDENT_AMBULATORY_CARE_PROVIDER_SITE_OTHER): Payer: Medicare HMO | Admitting: Psychology

## 2017-08-29 DIAGNOSIS — F4323 Adjustment disorder with mixed anxiety and depressed mood: Secondary | ICD-10-CM

## 2017-09-04 ENCOUNTER — Encounter: Payer: Self-pay | Admitting: Emergency Medicine

## 2017-09-04 ENCOUNTER — Ambulatory Visit
Admission: EM | Admit: 2017-09-04 | Discharge: 2017-09-04 | Disposition: A | Discharge status: Home / Self Care | Payer: Medicare HMO | Attending: Family Medicine | Admitting: Family Medicine

## 2017-09-04 DIAGNOSIS — R059 Cough, unspecified: Secondary | ICD-10-CM

## 2017-09-04 DIAGNOSIS — R05 Cough: Secondary | ICD-10-CM

## 2017-09-04 DIAGNOSIS — R062 Wheezing: Secondary | ICD-10-CM | POA: Diagnosis not present

## 2017-09-04 DIAGNOSIS — J9801 Acute bronchospasm: Secondary | ICD-10-CM

## 2017-09-04 MED ORDER — DOXYCYCLINE HYCLATE 100 MG PO TABS: 100.0000 mg | ORAL_TABLET | Freq: Two times a day (BID) | ORAL | 0 refills | Status: DC

## 2017-09-04 MED ORDER — BENZONATATE 200 MG PO CAPS
200.0000 mg | ORAL_CAPSULE | Freq: Three times a day (TID) | ORAL | 0 refills | Status: DC | PRN
Start: 2017-09-04 — End: 2019-01-12

## 2017-09-04 MED ORDER — ALBUTEROL SULFATE HFA 108 (90 BASE) MCG/ACT IN AERS: 1.0000 | INHALATION_SPRAY | Freq: Four times a day (QID) | RESPIRATORY_TRACT | 0 refills | Status: DC | PRN

## 2017-09-04 NOTE — ED Triage Notes (Signed)
Patient c/o cough and chest congestion for 3 days.

## 2017-09-04 NOTE — ED Provider Notes (Signed)
[facility]    CSN: [id number] Arrival date & time: 09/04/17  1342     History   Chief Complaint Chief Complaint  Patient presents with  . Cough    HPI Rachel Torres is a 61 y.o. female.   The history is provided by the patient.  Cough  Associated symptoms: wheezing   URI  Presenting symptoms: cough   Severity:  Moderate Onset quality:  Sudden Duration:  6 days Timing:  Constant Progression:  Worsening Chronicity:  New Relieved by:  Nothing Ineffective treatments:  OTC medications Associated symptoms: wheezing   Risk factors: chronic respiratory disease (asthma)   Risk factors: not elderly, no chronic cardiac disease, no chronic kidney disease, no diabetes mellitus, no immunosuppression, no recent illness, no recent travel and no sick contacts     Past Medical History:  Diagnosis Date  . AAA (abdominal aortic aneurysm) ([place])    a. Chronic w/o evidence of aneurysmal dil on CTA 01/2016.  Marland Kitchen Allergy   . Anemia   . Anxiety   . Asthma   . CAD (coronary artery disease)    a. 08/2010 s/p CABG x 1 (VG->RCA) @ time of Ao dissection repair; b. 01/2016 Lexiscan MV: mid antsept/apical defect w/ ? peri-infarct ischemia-->likely attenuation-->Med Rx.  . Chronic combined systolic (congestive) and diastolic (congestive) heart failure ([place])    a. 2012 EF 30-35%; b. 04/2015 EF 40-45%; c. 01/2016 Echo: Ef 55-65%, nl AoV bioprosthesis, nl RV, nl PASP.  Marland Kitchen Depression   . Gallbladder sludge   . GERD (gastroesophageal reflux disease)   . History of Bicuspid Aortic Valve    a. 08/2010 s/p AVR @ time of Ao dissection repair; b. 01/2016 Echo: Ef 55-65%, nl AoV bioprosthesis, nl RV, nl PASP.  Marland Kitchen Hx of repair of dissecting thoracic aortic aneurysm, [place] type A    a. 08/2010 s/p repair with AVR and VG->RCA; b. 01/2016 CTA: stable appearance of Asc Thoracic Aortic graft. Opacification of flase lumen of chronic abd Ao dissection w/ retrograde flow through lumbar arteries. No  aneurysmal dil of flase lumen.  . Hyperlipidemia   . Hypertensive heart disease   . IBS (irritable bowel syndrome)   . Internal hemorrhoids   . Marfan syndrome    pt denies  . Migraine   . Obstructive sleep apnea   . Osteoporosis   . Pneumonia   . RA (rheumatoid arthritis) ([place])    a. Followed by Dr. [name]    Patient Active Problem List   Diagnosis Date Noted  . Type 1 dissection of ascending aorta ([place]) 04/23/2017  . HPV (human papilloma virus) infection 12/01/2016  . Screening mammogram, encounter for 11/29/2016  . Estrogen deficiency 11/29/2016  . Encounter for routine gynecological examination 11/29/2016  . Grade III hemorrhoids 10/06/2016  . Tachycardia 08/14/2016  . Hemorrhoids 08/13/2016  . Atypical migraine 08/03/2016  . Sweating 07/12/2016  . CAD (coronary artery disease)   . AAA (abdominal aortic aneurysm) ([place])   . Chronic combined systolic (congestive) and diastolic (congestive) heart failure ([place])   . Hyperglycemia 02/11/2016  . Generalized anxiety disorder 12/08/2015  . Panic attacks 07/28/2015  . Sensorineural hearing loss 03/27/2013  . Aortic dissection ([place]) 05/12/2012  . Obesity (BMI 30.0-34.9) 11/21/2011  . Routine general medical examination at a health care facility 09/20/2011  . S/P AVR (aortic valve replacement) 01/07/2011  . CORONARY ARTERY BYPASS GRAFT, HX OF 01/07/2011  . Arthritis 12/20/2010  . H/O dissecting abdominal aortic aneurysm repair 08/24/2010  . IRRITABLE  BOWEL SYNDROME 09/09/2009  . Obstructive sleep apnea 08/04/2009  . External hemorrhoid 06/26/2009  . Osteoporosis 01/09/2009  . Hyperlipidemia 07/26/2007  . Depression with anxiety 07/26/2007  . Essential hypertension 07/26/2007  . Allergic rhinitis 07/26/2007  . Asthma 07/26/2007  . GERD 07/26/2007  . Osteoarthritis 07/26/2007  . Ocular migraine 07/26/2007  . Mild intermittent asthma without complication [id number]/63/[id number]    Past Surgical History:  Procedure Laterality Date    . ABDOMINAL AORTIC ANEURYSM REPAIR    . CARDIAC CATHETERIZATION  2001  . [place]  . CHOLECYSTECTOMY    . COLONOSCOPY    . FRACTURE SURGERY Right    shoulder replacement  . HEMORRHOID BANDING    . ORIF DISTAL RADIUS FRACTURE Left   . UPPER GASTROINTESTINAL ENDOSCOPY      OB History    No data available       Home Medications    Prior to Admission medications   Medication Sig Start Date End Date Taking? Authorizing Provider  albuterol (PROVENTIL HFA;VENTOLIN HFA) 108 (90 Base) MCG/ACT inhaler Inhale 1-2 puffs into the lungs every 6 (six) hours as needed for wheezing or shortness of breath. 09/04/17   Norval Gable, MD  ALPRAZolam Duanne Moron) 0.5 MG tablet Take 1 tablet (0.5 mg total) by mouth daily as needed for anxiety. 06/28/17   Tower, Wynelle Fanny, MD  aspirin EC 81 MG tablet Take 81 mg by mouth at bedtime.    [provider]  atorvastatin (LIPITOR) 20 MG tablet Take 1 tablet (20 mg total) by mouth daily. 04/26/17   Minna Merritts, MD  benzonatate (TESSALON) 200 MG capsule Take 1 capsule (200 mg total) by mouth 3 (three) times daily as needed. 09/04/17   Norval Gable, MD  carvedilol (COREG) 6.25 MG tablet TAKE 1 TABLET (6.25 MG) BY MOUTH 2 (TWO) TIMES DAILY WITH MEALS 07/27/17   Minna Merritts, MD  cholecalciferol (VITAMIN D) 1000 units tablet Take 2,000 Units by mouth daily.    [provider]  cyclobenzaprine (FLEXERIL) 10 MG tablet Take 0.5-1 tablets (5-10 mg total) by mouth 3 (three) times daily as needed. For headache 06/28/17   Tower, Wynelle Fanny, MD  doxycycline (VIBRA-TABS) 100 MG tablet Take 1 tablet (100 mg total) by mouth 2 (two) times daily. 09/04/17   Norval Gable, MD  furosemide (LASIX) 20 MG tablet TAKE 1 TABLET (20 MG TOTAL) BY MOUTH DAILY. 07/27/17 10/20/21  Minna Merritts, MD  Glucosamine-Chondroitin (OSTEO BI-FLEX REGULAR STRENGTH PO) Take by mouth daily.    [provider]  hydrocortisone 2.5 % cream Apply topically 2  (two) times daily. For maximum 7 days. 04/15/17   Elby Beck, FNP  lisinopril (PRINIVIL,ZESTRIL) 5 MG tablet TAKE 1/2 TABLET (2.5 MG TOTAL) BY MOUTH DAILY. 04/26/17   Minna Merritts, MD  magnesium gluconate (MAGONATE) 500 MG tablet Take 500 mg by mouth at bedtime.    [provider]  Melatonin 3 MG CAPS Take 1 capsule by mouth at bedtime as needed.    [provider]  Misc Natural Products (TART CHERRY ADVANCED) CAPS Take 1 capsule by mouth daily.    [provider]  naproxen sodium (ANAPROX) 220 MG tablet Take 440 mg by mouth 2 (two) times daily as needed (for headaches/pain).    [provider]  nitroGLYCERIN (NITROSTAT) 0.4 MG SL tablet Place 1 tablet (0.4 mg total) under the tongue every 5 (five) minutes as needed for chest pain. 04/26/17   [name]  J, MD  omeprazole (PRILOSEC) 40 MG capsule Take 1 capsule (40 mg total) by mouth daily. 04/15/17   Tower, Wynelle Fanny, MD  potassium chloride (K-DUR) 10 MEQ tablet Take 2 tablets (20 mEq total) by mouth daily. 04/26/17   Minna Merritts, MD  potassium chloride (K-DUR,KLOR-CON) 10 MEQ tablet TAKE 1 TABLET (10 MEQ) BY MOUTH 2 (TWO) TIMES DAILY AS NEEDED. 07/27/17   Minna Merritts, MD  promethazine (PHENERGAN) 25 MG tablet Take 0.5 tablets (12.5 mg total) by mouth every 8 (eight) hours as needed for nausea. 06/28/17   Tower, Wynelle Fanny, MD  venlafaxine XR (EFFEXOR-XR) 150 MG 24 hr capsule Take 1 capsule (150 mg total) by mouth daily with breakfast. 06/28/17   Tower, Wynelle Fanny, MD    Family History Family History  Problem Relation Age of Onset  . Hypertension Mother   . Depression Mother   . Osteoporosis Mother   . Stroke Mother   . Irritable bowel syndrome Mother   . Asthma Mother   . Arthritis Sister   . Diabetes Sister   . Heart disease Sister   . Asthma Sister   . Migraines Sister   . Nephrolithiasis Sister   . Osteoporosis Sister   . Arthritis Sister   . Asthma Sister   . Migraines Sister   .  Other Father 69  . Stroke Maternal Grandmother   . Colon cancer Maternal Grandmother   . Lymphoma Maternal Grandmother   . Migraines Maternal Grandmother   . Aneurysm Paternal Grandmother        brain  . Diabetes Paternal Grandmother   . Arthritis Paternal Grandmother   . Arthritis Paternal Grandfather   . Diabetes Paternal Grandfather   . Other Brother        prediabeties  . Asthma Brother   . Migraines Brother   . Lung cancer Maternal Grandfather   . Melanoma Maternal Grandfather     Social History Social History  Substance Use Topics  . Smoking status: Former Smoker    Quit date: 12/20/1978  . Smokeless tobacco: Never Used  . Alcohol use 0.0 oz/week     Comment: occassional     Allergies   Fosamax [alendronate sodium]; Ginzing [ginseng]; Hydrocod polst-cpm polst er; Hydrocodone-acetaminophen; Imitrex [sumatriptan]; Oxycodone; Peanut-containing drug products; Hydrocodone-guaifenesin; Prednisone; and Tape   Review of Systems Review of Systems  Respiratory: Positive for cough and wheezing.      Physical Exam Triage Vital Signs ED Triage Vitals  Enc Vitals Group     BP 09/04/17 1501 131/62     Pulse Rate 09/04/17 1501 86     Resp 09/04/17 1501 17     Temp 09/04/17 1501 98.1 F (36.7 C)     Temp Source 09/04/17 1501 Oral     SpO2 09/04/17 1501 98 %     Weight 09/04/17 1458 200 lb (90.7 kg)     Height 09/04/17 1458 5\' 5"  (1.651 m)     Head Circumference --      Peak Flow --      Pain Score 09/04/17 1459 4     Pain Loc --      Pain Edu? --      Excl. in [place]? --    No data found.   Updated Vital Signs BP 131/62 (BP Location: Left Arm)   Pulse 86   Temp 98.1 F (36.7 C) (Oral)   Resp 17   Ht 5\' 5"  (1.651 m)   Wt 200 lb (90.7  kg)   SpO2 98%   BMI 33.28 kg/m   Visual Acuity Right Eye Distance:   Left Eye Distance:   Bilateral Distance:    Right Eye Near:   Left Eye Near:    Bilateral Near:     Physical Exam  Constitutional: She appears  well-developed and well-nourished. No distress.  HENT:  Head: Normocephalic and atraumatic.  Right Ear: Tympanic membrane, external ear and ear canal normal.  Left Ear: Tympanic membrane, external ear and ear canal normal.  Nose: No mucosal edema, rhinorrhea, nose lacerations, sinus tenderness, nasal deformity, septal deviation or nasal septal hematoma. No epistaxis.  No foreign bodies. Right sinus exhibits no maxillary sinus tenderness and no frontal sinus tenderness. Left sinus exhibits no maxillary sinus tenderness and no frontal sinus tenderness.  Mouth/Throat: Uvula is midline, oropharynx is clear and moist and mucous membranes are normal. No oropharyngeal exudate.  Eyes: Pupils are equal, round, and reactive to light. Conjunctivae and EOM are normal. Right eye exhibits no discharge. Left eye exhibits no discharge. No scleral icterus.  Neck: Normal range of motion. Neck supple. No thyromegaly present.  Cardiovascular: Normal rate, regular rhythm and normal heart sounds.   Pulmonary/Chest: Effort normal. No respiratory distress. She has wheezes (diffusely, bilaterally plus rhonchi). She has no rales.  Lymphadenopathy:    She has no cervical adenopathy.  Skin: She is not diaphoretic.  Nursing note and vitals reviewed.    UC Treatments / Results  Labs (all labs ordered are listed, but only abnormal results are displayed) Labs Reviewed - No data to display  EKG  EKG Interpretation None       Radiology No results found.  Procedures Procedures (including critical care time)  Medications Ordered in UC Medications - No data to display   Initial Impression / Assessment and Plan / UC Course  I have reviewed the triage vital signs and the nursing notes.  Pertinent labs & imaging results that were available during my care of the patient were reviewed by me and considered in my medical decision making (see chart for details).       Final Clinical Impressions(s) / UC Diagnoses    Final diagnoses:  Bronchospasm  Cough    New Prescriptions Discharge Medication List as of 09/04/2017  3:16 PM    START taking these medications   Details  benzonatate (TESSALON) 200 MG capsule Take 1 capsule (200 mg total) by mouth 3 (three) times daily as needed., Starting Sun 09/04/2017, Normal    doxycycline (VIBRA-TABS) 100 MG tablet Take 1 tablet (100 mg total) by mouth 2 (two) times daily., Starting Sun 09/04/2017, Normal       1. Labs/x-ray results and diagnosis reviewed with patient/parent/guardian/family 2. rx as per orders above; reviewed possible side effects, interactions, risks and benefits; rx for albuterol MDI as per orders  3. Recommend supportive treatment with  4. Follow-up prn if symptoms worsen or don't improve Controlled Substance Prescriptions [place] Controlled Substance Registry consulted? Not Applicable   Norval Gable, MD 09/04/17 1534

## 2017-09-05 ENCOUNTER — Ambulatory Visit: Payer: Self-pay | Admitting: Psychology

## 2017-09-07 ENCOUNTER — Encounter: Payer: Self-pay | Admitting: Family Medicine

## 2017-09-07 ENCOUNTER — Ambulatory Visit (INDEPENDENT_AMBULATORY_CARE_PROVIDER_SITE_OTHER)
Admission: RE | Admit: 2017-09-07 | Discharge: 2017-09-07 | Disposition: A | Discharge status: Home / Self Care | Payer: Medicare HMO | Source: Ambulatory Visit | Attending: Family Medicine | Admitting: Family Medicine

## 2017-09-07 ENCOUNTER — Ambulatory Visit (INDEPENDENT_AMBULATORY_CARE_PROVIDER_SITE_OTHER): Payer: Medicare HMO | Admitting: Family Medicine

## 2017-09-07 VITALS — BP 116/74 | HR 78 | Temp 98.3°F | Wt 198.0 lb

## 2017-09-07 DIAGNOSIS — R059 Cough, unspecified: Secondary | ICD-10-CM

## 2017-09-07 DIAGNOSIS — R05 Cough: Secondary | ICD-10-CM

## 2017-09-07 NOTE — Patient Instructions (Signed)
Go to the lab on the way out.  We'll contact you with your xray report. If the xray is abnormal or if you aren't getting better, then we may need to change your antibiotics.  Try robitussin DM in the meantime for cough.   Take care.  Glad to see you.

## 2017-09-07 NOTE — Progress Notes (Signed)
Cough for about 10 days, on abx, prev seen at [facility].   No fevers.  Some sputum.  Still with green sputum.  Using SABA w/o ADE.  It isn't helping a lot with the cough.  No vomiting, no diarrhea.  Appetite is okay, but taste is altered on the abx.   Still wheezing.   She is not better than the other day at the outside clinic.  HA today- possibly from continued cough.   She is still caring for her husband who has multiple chronic illnesses.    Meds, vitals, and allergies reviewed.   ROS: Per HPI unless specifically indicated in ROS section   GEN: nad, alert and oriented HEENT: mucous membranes moist, tm w/o erythema, nasal exam w/o erythema, clear discharge noted,  OP with cobblestoning NECK: supple w/o LA CV: rrr.   PULM: scattered rhonchi but o/w ctab, no inc wob EXT: no edema SKIN: no acute rash

## 2017-09-08 DIAGNOSIS — R059 Cough, unspecified: Secondary | ICD-10-CM | POA: Insufficient documentation

## 2017-09-08 DIAGNOSIS — R05 Cough: Secondary | ICD-10-CM | POA: Insufficient documentation

## 2017-09-08 DIAGNOSIS — R051 Acute cough: Secondary | ICD-10-CM | POA: Insufficient documentation

## 2017-09-08 NOTE — Assessment & Plan Note (Signed)
Likely bronchitis based on exam. No sign of pneumonia on exam. Given the cough and sputum production, along with medical history, check chest x-ray. Continue doxycycline for now. If the xray is abnormal or if she isn't getting better, then we may need to broaden her antibiotic coverage.  Okay to try robitussin DM in the meantime for cough.  Okay for outpatient follow-up. Discussed with patient. She agrees.

## 2017-09-12 ENCOUNTER — Ambulatory Visit: Payer: Medicare HMO | Admitting: Psychology

## 2017-09-12 ENCOUNTER — Other Ambulatory Visit: Payer: Self-pay | Admitting: Family Medicine

## 2017-09-12 ENCOUNTER — Encounter: Payer: Self-pay | Admitting: Family Medicine

## 2017-09-12 NOTE — Telephone Encounter (Signed)
Rx filled 03/2017 #90 2R

## 2017-09-14 ENCOUNTER — Ambulatory Visit (INDEPENDENT_AMBULATORY_CARE_PROVIDER_SITE_OTHER): Payer: Medicare HMO | Admitting: Psychology

## 2017-09-14 DIAGNOSIS — F4323 Adjustment disorder with mixed anxiety and depressed mood: Secondary | ICD-10-CM | POA: Diagnosis not present

## 2017-09-15 DIAGNOSIS — B850 Pediculosis due to Pediculus humanus capitis: Secondary | ICD-10-CM | POA: Diagnosis not present

## 2017-09-15 DIAGNOSIS — L218 Other seborrheic dermatitis: Secondary | ICD-10-CM | POA: Diagnosis not present

## 2017-09-19 ENCOUNTER — Ambulatory Visit (INDEPENDENT_AMBULATORY_CARE_PROVIDER_SITE_OTHER): Payer: Medicare HMO | Admitting: Psychology

## 2017-09-19 DIAGNOSIS — F4323 Adjustment disorder with mixed anxiety and depressed mood: Secondary | ICD-10-CM

## 2017-09-26 ENCOUNTER — Ambulatory Visit (INDEPENDENT_AMBULATORY_CARE_PROVIDER_SITE_OTHER): Payer: Medicare HMO | Admitting: Psychology

## 2017-09-26 DIAGNOSIS — F4323 Adjustment disorder with mixed anxiety and depressed mood: Secondary | ICD-10-CM | POA: Diagnosis not present

## 2017-10-10 ENCOUNTER — Ambulatory Visit (INDEPENDENT_AMBULATORY_CARE_PROVIDER_SITE_OTHER): Payer: Medicare HMO | Admitting: Psychology

## 2017-10-10 DIAGNOSIS — F4323 Adjustment disorder with mixed anxiety and depressed mood: Secondary | ICD-10-CM | POA: Diagnosis not present

## 2017-10-20 ENCOUNTER — Ambulatory Visit: Payer: Medicare HMO | Admitting: Psychology

## 2017-10-24 ENCOUNTER — Ambulatory Visit (INDEPENDENT_AMBULATORY_CARE_PROVIDER_SITE_OTHER): Payer: Medicare HMO | Admitting: Psychology

## 2017-10-24 DIAGNOSIS — F4323 Adjustment disorder with mixed anxiety and depressed mood: Secondary | ICD-10-CM | POA: Diagnosis not present

## 2017-10-27 ENCOUNTER — Ambulatory Visit (INDEPENDENT_AMBULATORY_CARE_PROVIDER_SITE_OTHER): Payer: Medicare HMO

## 2017-10-27 DIAGNOSIS — Z23 Encounter for immunization: Secondary | ICD-10-CM

## 2017-10-31 ENCOUNTER — Ambulatory Visit (INDEPENDENT_AMBULATORY_CARE_PROVIDER_SITE_OTHER): Payer: Medicare HMO | Admitting: Psychology

## 2017-10-31 DIAGNOSIS — F4323 Adjustment disorder with mixed anxiety and depressed mood: Secondary | ICD-10-CM

## 2017-11-02 ENCOUNTER — Ambulatory Visit (INDEPENDENT_AMBULATORY_CARE_PROVIDER_SITE_OTHER): Payer: Medicare HMO | Admitting: Family Medicine

## 2017-11-02 ENCOUNTER — Encounter: Payer: Self-pay | Admitting: Family Medicine

## 2017-11-02 ENCOUNTER — Other Ambulatory Visit: Payer: Self-pay

## 2017-11-02 VITALS — BP 102/70 | HR 77 | Temp 98.6°F | Ht 65.0 in | Wt 207.5 lb

## 2017-11-02 DIAGNOSIS — M1711 Unilateral primary osteoarthritis, right knee: Secondary | ICD-10-CM | POA: Diagnosis not present

## 2017-11-02 DIAGNOSIS — M7542 Impingement syndrome of left shoulder: Secondary | ICD-10-CM | POA: Diagnosis not present

## 2017-11-02 MED ORDER — METHYLPREDNISOLONE ACETATE 40 MG/ML IJ SUSP
80.0000 mg | Freq: Once | INTRAMUSCULAR | Status: AC
  Administered 2017-11-02: 80 mg via INTRA_ARTICULAR

## 2017-11-02 MED ORDER — METHYLPREDNISOLONE ACETATE 40 MG/ML IJ SUSP
80.0000 mg | Freq: Once | INTRAMUSCULAR | Status: AC
Start: 2017-11-02 — End: 2017-11-02
  Administered 2017-11-02: 80 mg via INTRA_ARTICULAR

## 2017-11-02 NOTE — Progress Notes (Signed)
Dr. Frederico Hamman T. Karelly Dewalt, MD, [facility] [place], [address] Phone: [phone] Fax: [phone]  11/02/2017  Patient: Rachel Torres, MRN: [id number], DOB: 1956/09/17, 61 y.o.  Primary Physician:  Tower, Wynelle Fanny, MD   Chief Complaint  Patient presents with  . Knee Pain    Right  . Shoulder Pain    Left  . Hand Pain    Right Thumb   Subjective:   Rachel Torres is a 61 y.o. very pleasant female patient who presents with the following:  Previously, icing the patient after she had fallen twice in her right knee was bothering her quite a bit.  Has been about 9 months past, and she was at the beach last week and started to develop some pain in her knee again.  There is no significant swelling.  No bruising, no recent traumas.  I did a corticosteroid injection in the past, she responded well with that.  At the beach last week, was hurting quite a bit.   l shoulder impingement. She also having pain with abduction as well as internal range of motion.  She's not had any traumas or injuries, no prior operative interventions in the affected site.  CMC joint pain on the R. She also has some known CMC osteoarthritis which is flared up a little bit.  L shoulder.  R knee injections.    Past Medical History, Surgical History, Social History, Family History, Problem List, Medications, and Allergies have been reviewed and updated if relevant.  Patient Active Problem List   Diagnosis Date Noted  . Cough 09/08/2017  . Type 1 dissection of ascending aorta ([place]) 04/23/2017  . HPV (human papilloma virus) infection 12/01/2016  . Screening mammogram, encounter for 11/29/2016  . Estrogen deficiency 11/29/2016  . Encounter for routine gynecological examination 11/29/2016  . Grade III hemorrhoids 10/06/2016  . Tachycardia 08/14/2016  . Hemorrhoids 08/13/2016  . Atypical migraine 08/03/2016  . Sweating 07/12/2016  . CAD  (coronary artery disease)   . AAA (abdominal aortic aneurysm) ([place])   . Chronic combined systolic (congestive) and diastolic (congestive) heart failure ([place])   . Hyperglycemia 02/11/2016  . Generalized anxiety disorder 12/08/2015  . Panic attacks 07/28/2015  . Sensorineural hearing loss 03/27/2013  . Aortic dissection ([place]) 05/12/2012  . Obesity (BMI 30.0-34.9) 11/21/2011  . Routine general medical examination at a health care facility 09/20/2011  . S/P AVR (aortic valve replacement) 01/07/2011  . CORONARY ARTERY BYPASS GRAFT, HX OF 01/07/2011  . Arthritis 12/20/2010  . H/O dissecting abdominal aortic aneurysm repair 08/24/2010  . IRRITABLE BOWEL SYNDROME 09/09/2009  . Obstructive sleep apnea 08/04/2009  . External hemorrhoid 06/26/2009  . Osteoporosis 01/09/2009  . Hyperlipidemia 07/26/2007  . Depression with anxiety 07/26/2007  . Essential hypertension 07/26/2007  . Allergic rhinitis 07/26/2007  . Asthma 07/26/2007  . GERD 07/26/2007  . Osteoarthritis 07/26/2007  . Ocular migraine 07/26/2007  . Mild intermittent asthma without complication 32/67/1245    Past Medical History:  Diagnosis Date  . AAA (abdominal aortic aneurysm) ([place])    a. Chronic w/o evidence of aneurysmal dil on CTA 01/2016.  Marland Kitchen Allergy   . Anemia   . Anxiety   . Asthma   . CAD (coronary artery disease)    a. 08/2010 s/p CABG x 1 (VG->RCA) @ time of Ao dissection repair; b. 01/2016 Lexiscan MV: mid antsept/apical defect w/ ? peri-infarct ischemia-->likely attenuation-->Med Rx.  . Chronic combined systolic (congestive)  and diastolic (congestive) heart failure ([place])    a. 2012 EF 30-35%; b. 04/2015 EF 40-45%; c. 01/2016 Echo: Ef 55-65%, nl AoV bioprosthesis, nl RV, nl PASP.  Marland Kitchen Depression   . Gallbladder sludge   . GERD (gastroesophageal reflux disease)   . History of Bicuspid Aortic Valve    a. 08/2010 s/p AVR @ time of Ao dissection repair; b. 01/2016 Echo: Ef 55-65%, nl AoV bioprosthesis, nl RV, nl PASP.  Marland Kitchen Hx  of repair of dissecting thoracic aortic aneurysm, [place] type A    a. 08/2010 s/p repair with AVR and VG->RCA; b. 01/2016 CTA: stable appearance of Asc Thoracic Aortic graft. Opacification of flase lumen of chronic abd Ao dissection w/ retrograde flow through lumbar arteries. No aneurysmal dil of flase lumen.  . Hyperlipidemia   . Hypertensive heart disease   . IBS (irritable bowel syndrome)   . Internal hemorrhoids   . Marfan syndrome    pt denies  . Migraine   . Obstructive sleep apnea   . Osteoporosis   . Pneumonia   . RA (rheumatoid arthritis) ([place])    a. Followed by Dr. [name]    Past Surgical History:  Procedure Laterality Date  . ABDOMINAL AORTIC ANEURYSM REPAIR    . CARDIAC CATHETERIZATION  2001  . [place]  . CHOLECYSTECTOMY    . COLONOSCOPY    . FRACTURE SURGERY Right    shoulder replacement  . HEMORRHOID BANDING    . ORIF DISTAL RADIUS FRACTURE Left   . UPPER GASTROINTESTINAL ENDOSCOPY      Social History   Socioeconomic History  . Marital status: Married    Spouse name: Not on file  . Number of children: 2  . Years of education: Not on file  . Highest education level: Not on file  Social Needs  . Financial resource strain: Not on file  . Food insecurity - worry: Not on file  . Food insecurity - inability: Not on file  . Transportation needs - medical: Not on file  . Transportation needs - non-medical: Not on file  Occupational History  . Occupation: disabled    Employer: [employer]  Tobacco Use  . Smoking status: Former Smoker    Last attempt to quit: 12/20/1978    Years since quitting: 38.8  . Smokeless tobacco: Never Used  Substance and Sexual Activity  . Alcohol use: No    Alcohol/week: 0.0 oz  . Drug use: No  . Sexual activity: No  Other Topics Concern  . Not on file  Social History Narrative    married for the second time, happily   On disability after dissection of aortic aneurysm   Husband has Parkinson's   4-6  caffeinated beverages daily   04/29/2017       Family History  Problem Relation Age of Onset  . Hypertension Mother   . Depression Mother   . Osteoporosis Mother   . Stroke Mother   . Irritable bowel syndrome Mother   . Asthma Mother   . Arthritis Sister   . Diabetes Sister   . Heart disease Sister   . Asthma Sister   . Migraines Sister   . Nephrolithiasis Sister   . Osteoporosis Sister   . Arthritis Sister   . Asthma Sister   . Migraines Sister   . Other Father 57  . Stroke Maternal Grandmother   . Colon cancer Maternal Grandmother   . Lymphoma Maternal Grandmother   . Migraines Maternal Grandmother   .  Aneurysm Paternal Grandmother        brain  . Diabetes Paternal Grandmother   . Arthritis Paternal Grandmother   . Arthritis Paternal Grandfather   . Diabetes Paternal Grandfather   . Other Brother        prediabeties  . Asthma Brother   . Migraines Brother   . Lung cancer Maternal Grandfather   . Melanoma Maternal Grandfather     Allergies  Allergen Reactions  . Fosamax [Alendronate Sodium]     GI upset   . Ginzing [Ginseng] Other (See Comments)    Hyper and jitery   . Hydrocod Polst-Cpm Polst Er Itching  . Hydrocodone-Acetaminophen Itching  . Imitrex [Sumatriptan]     Contraindicated w/ heart condition  . Oxycodone Itching  . Peanut-Containing Drug Products Other (See Comments)    Reaction:  Migraines   . Tramadol Other (See Comments)    itching  . Hydrocodone-Guaifenesin Itching  . Prednisone Rash  . Tape Itching    Medication list reviewed and updated in full in [place].  GEN: No fevers, chills. Nontoxic. Primarily MSK c/o today. MSK: Detailed in the HPI GI: tolerating PO intake without difficulty Neuro: No numbness, parasthesias, or tingling associated. Otherwise the pertinent positives of the ROS are noted above.   Objective:   BP 102/70   Pulse 77   Temp 98.6 F (37 C) (Oral)   Ht 5\' 5"  (1.651 m)   Wt 207 lb 8 oz (94.1 kg)    BMI 34.53 kg/m    GEN: WDWN, NAD, Non-toxic, Alert & Oriented x 3 HEENT: Atraumatic, Normocephalic.  Ears and Nose: No external deformity. EXTR: No clubbing/cyanosis/edema NEURO: Normal gait.  PSYCH: Normally interactive. Conversant. Not depressed or anxious appearing.  Calm demeanor.   Knee:  R Gait: Normal heel toe pattern ROM: 0-125 Effusion: neg Echymosis or edema: none Patellar tendon NT Painful PLICA: neg Patellar grind: negative Medial and lateral patellar facet loading: negative medial and lateral joint lines: medial joint line pain > lat Mcmurray's neg Flexion-pinch neg Varus and valgus stress: stable Lachman: neg Ant and Post drawer: neg Hip abduction, IR, ER: WNL Hip flexion str: 5/5 Hip abd: 5/5 Quad: 5/5 VMO atrophy:No Hamstring concentric and eccentric: 5/5   Shoulder: L Inspection: No muscle wasting or winging Ecchymosis/edema: neg  AC joint, scapula, clavicle: NT Cervical spine: NT, full ROM Spurling's: neg Abduction: full, 5/5 Flexion: full, 5/5 IR, full, lift-off: 5/5 ER at neutral: full, 5/5 AC crossover: neg Neer: pos Hawkins: pos Drop Test: neg Empty Can: pos Supraspinatus insertion: mild-mod T Bicipital groove: NT Speed's: neg Yergason's: neg Sulcus sign: neg Scapular dyskinesis: none C5-T1 intact  Neuro: Sensation intact Grip 5/5   Radiology: No results found.  Assessment and Plan:   Primary osteoarthritis of right knee - Plan: methylPREDNISolone acetate (DEPO-MEDROL) injection 80 mg  Impingement syndrome of left shoulder - Plan: methylPREDNISolone acetate (DEPO-MEDROL) injection 80 mg  CMC OA not that bad today. Supportive care.   Knee Injection, R Patient verbally consented to procedure. Risks (including potential rare risk of infection), benefits, and alternatives explained. Sterilely prepped with Chloraprep. Ethyl cholride used for anesthesia. 8 cc Lidocaine 1% mixed with 2 mL Depo-Medrol 40 mg injected using the  anteromedial approach without difficulty. No complications with procedure and tolerated well. Patient had decreased pain post-injection.   SubAC Injection, L Verbal consent was obtained from the patient. Risks (including rare infection), benefits, and alternatives were explained. Patient prepped with Chloraprep and Ethyl Chloride used for  anesthesia. The subacromial space was injected using the posterior approach. The patient tolerated the procedure well and had decreased pain post injection. No complications. Injection: 8 cc of Lidocaine 1% and 2 mL of Depo-Medrol 40 mg. Needle: 22 gauge   Follow-up: No Follow-up on file.  Future Appointments  Date Time Provider [place]  11/07/2017  3:00 PM Doree Fudge, PhD [facility] None  11/14/2017  3:00 PM Doree Fudge, PhD [facility] None  11/22/2017  9:00 AM Eustace Pen, LPN [facility]  37/[id number]/[id number]  9:00 AM Tower, Wynelle Fanny, MD [facility]    Meds ordered this encounter  Medications  . methylPREDNISolone acetate (DEPO-MEDROL) injection 80 mg  . methylPREDNISolone acetate (DEPO-MEDROL) injection 80 mg   Medications Discontinued During This Encounter  Medication Reason  . doxycycline (VIBRA-TABS) 100 MG tablet Completed Course   Signed,  Quinisha Mould T. Wylee Ogden, MD   Allergies as of 11/02/2017      Reactions   Fosamax [alendronate Sodium]    GI upset   Ginzing [ginseng] Other (See Comments)   Hyper and jitery    Hydrocod Polst-cpm Polst Er Itching   Hydrocodone-acetaminophen Itching   Imitrex [sumatriptan]    Contraindicated w/ heart condition   Oxycodone Itching   Peanut-containing Drug Products Other (See Comments)   Reaction:  Migraines    Tramadol Other (See Comments)   itching   Hydrocodone-guaifenesin Itching   Prednisone Rash   Tape Itching      Medication List        Accurate as of 11/02/17 11:59 PM. Always use your most recent med list.          albuterol 108 (90 Base) MCG/ACT inhaler Commonly  known as:  PROVENTIL HFA;VENTOLIN HFA Inhale 1-2 puffs into the lungs every 6 (six) hours as needed for wheezing or shortness of breath.   ALPRAZolam 0.5 MG tablet Commonly known as:  XANAX Take 1 tablet (0.5 mg total) by mouth daily as needed for anxiety.   aspirin EC 81 MG tablet Take 81 mg by mouth at bedtime.   atorvastatin 20 MG tablet Commonly known as:  LIPITOR Take 1 tablet (20 mg total) by mouth daily.   benzonatate 200 MG capsule Commonly known as:  TESSALON Take 1 capsule (200 mg total) by mouth 3 (three) times daily as needed.   carvedilol 6.25 MG tablet Commonly known as:  COREG TAKE 1 TABLET (6.25 MG) BY MOUTH 2 (TWO) TIMES DAILY WITH MEALS   cholecalciferol 1000 units tablet Commonly known as:  VITAMIN D Take 2,000 Units by mouth daily.   cyclobenzaprine 10 MG tablet Commonly known as:  FLEXERIL Take 0.5-1 tablets (5-10 mg total) by mouth 3 (three) times daily as needed. For headache   furosemide 20 MG tablet Commonly known as:  LASIX TAKE 1 TABLET (20 MG TOTAL) BY MOUTH DAILY.   hydrocortisone 2.5 % cream Apply topically 2 (two) times daily. For maximum 7 days.   lisinopril 5 MG tablet Commonly known as:  PRINIVIL,ZESTRIL TAKE 1/2 TABLET (2.5 MG TOTAL) BY MOUTH DAILY.   magnesium gluconate 500 MG tablet Commonly known as:  MAGONATE Take 500 mg by mouth at bedtime.   Melatonin 3 MG Caps Take 1 capsule by mouth at bedtime as needed.   naproxen sodium 220 MG tablet Commonly known as:  ALEVE Take 440 mg by mouth 2 (two) times daily as needed (for headaches/pain).   nitroGLYCERIN 0.4 MG SL tablet Commonly known as:  NITROSTAT Place 1 tablet (0.4 mg  total) under the tongue every 5 (five) minutes as needed for chest pain.   omeprazole 40 MG capsule Commonly known as:  PRILOSEC Take 1 capsule (40 mg total) by mouth daily.   OSTEO BI-FLEX REGULAR STRENGTH PO Take by mouth daily.   potassium chloride 10 MEQ tablet Commonly known as:  K-DUR Take 2  tablets (20 mEq total) by mouth daily.   promethazine 25 MG tablet Commonly known as:  PHENERGAN Take 0.5 tablets (12.5 mg total) by mouth every 8 (eight) hours as needed for nausea.   TART CHERRY ADVANCED Caps Take 1 capsule by mouth daily.   venlafaxine XR 150 MG 24 hr capsule Commonly known as:  EFFEXOR-XR Take 1 capsule (150 mg total) by mouth daily with breakfast.

## 2017-11-03 ENCOUNTER — Encounter: Payer: Self-pay | Admitting: Family Medicine

## 2017-11-07 ENCOUNTER — Ambulatory Visit (INDEPENDENT_AMBULATORY_CARE_PROVIDER_SITE_OTHER): Payer: Medicare HMO | Admitting: Psychology

## 2017-11-07 DIAGNOSIS — F4323 Adjustment disorder with mixed anxiety and depressed mood: Secondary | ICD-10-CM

## 2017-11-14 ENCOUNTER — Ambulatory Visit (INDEPENDENT_AMBULATORY_CARE_PROVIDER_SITE_OTHER): Payer: Medicare HMO | Admitting: Psychology

## 2017-11-14 DIAGNOSIS — F4323 Adjustment disorder with mixed anxiety and depressed mood: Secondary | ICD-10-CM | POA: Diagnosis not present

## 2017-11-20 ENCOUNTER — Telehealth: Payer: Self-pay | Admitting: Family Medicine

## 2017-11-20 DIAGNOSIS — E7800 Pure hypercholesterolemia, unspecified: Secondary | ICD-10-CM

## 2017-11-20 DIAGNOSIS — R739 Hyperglycemia, unspecified: Secondary | ICD-10-CM

## 2017-11-20 DIAGNOSIS — E559 Vitamin D deficiency, unspecified: Secondary | ICD-10-CM

## 2017-11-20 DIAGNOSIS — I10 Essential (primary) hypertension: Secondary | ICD-10-CM

## 2017-11-20 NOTE — Telephone Encounter (Signed)
-----   Message from Eustace Pen, LPN sent at 37/36/6815  3:08 PM EST ----- Regarding: Labs 12/3 Lab orders needed. Thank you.  Insurance:  [employer]

## 2017-11-21 ENCOUNTER — Ambulatory Visit (INDEPENDENT_AMBULATORY_CARE_PROVIDER_SITE_OTHER): Payer: Medicare HMO | Admitting: Psychology

## 2017-11-21 ENCOUNTER — Telehealth: Payer: Self-pay

## 2017-11-21 DIAGNOSIS — F4323 Adjustment disorder with mixed anxiety and depressed mood: Secondary | ICD-10-CM

## 2017-11-21 DIAGNOSIS — Z1159 Encounter for screening for other viral diseases: Secondary | ICD-10-CM

## 2017-11-21 NOTE — Telephone Encounter (Signed)
Appt reminder call made. Pt advised to fast for labs. Hep C being added to labs. Pt declined HIV screening.

## 2017-11-22 ENCOUNTER — Ambulatory Visit: Payer: Self-pay

## 2017-11-24 ENCOUNTER — Ambulatory Visit: Payer: Self-pay

## 2017-11-24 ENCOUNTER — Telehealth: Payer: Self-pay | Admitting: Family Medicine

## 2017-11-24 ENCOUNTER — Other Ambulatory Visit (INDEPENDENT_AMBULATORY_CARE_PROVIDER_SITE_OTHER): Payer: Medicare HMO

## 2017-11-24 DIAGNOSIS — Z1159 Encounter for screening for other viral diseases: Secondary | ICD-10-CM

## 2017-11-24 DIAGNOSIS — E559 Vitamin D deficiency, unspecified: Secondary | ICD-10-CM | POA: Diagnosis not present

## 2017-11-24 DIAGNOSIS — R739 Hyperglycemia, unspecified: Secondary | ICD-10-CM

## 2017-11-24 DIAGNOSIS — E7800 Pure hypercholesterolemia, unspecified: Secondary | ICD-10-CM | POA: Diagnosis not present

## 2017-11-24 DIAGNOSIS — I10 Essential (primary) hypertension: Secondary | ICD-10-CM

## 2017-11-24 LAB — CBC WITH DIFFERENTIAL/PLATELET
Basophils Absolute: 0 10*3/uL (ref 0.0–0.1)
Basophils Relative: 0.4 % (ref 0.0–3.0)
Eosinophils Absolute: 0.2 10*3/uL (ref 0.0–0.7)
Eosinophils Relative: 3.5 % (ref 0.0–5.0)
HCT: 39.2 % (ref 36.0–46.0)
Hemoglobin: 12.9 g/dL (ref 12.0–15.0)
Lymphocytes Relative: 25.6 % (ref 12.0–46.0)
Lymphs Abs: 1.7 10*3/uL (ref 0.7–4.0)
MCHC: 32.9 g/dL (ref 30.0–36.0)
MCV: 97.1 fl (ref 78.0–100.0)
Monocytes Absolute: 0.5 10*3/uL (ref 0.1–1.0)
Monocytes Relative: 7.9 % (ref 3.0–12.0)
Neutro Abs: 4.2 10*3/uL (ref 1.4–7.7)
Neutrophils Relative %: 62.6 % (ref 43.0–77.0)
Platelets: 267 10*3/uL (ref 150.0–400.0)
RBC: 4.04 Mil/uL (ref 3.87–5.11)
RDW: 13.9 % (ref 11.5–15.5)
WBC: 6.8 10*3/uL (ref 4.0–10.5)

## 2017-11-24 LAB — LIPID PANEL
Cholesterol: 193 mg/dL (ref 0–200)
HDL: 63.2 mg/dL (ref >39.00)
LDL Cholesterol: 114 mg/dL — ABNORMAL HIGH (ref 0–99)
NonHDL: 129.55
Total CHOL/HDL Ratio: 3
Triglycerides: 78 mg/dL (ref 0.0–149.0)
VLDL: 15.6 mg/dL (ref 0.0–40.0)

## 2017-11-24 LAB — HEMOGLOBIN A1C: Hgb A1c MFr Bld: 6 % (ref 4.6–6.5)

## 2017-11-24 LAB — VITAMIN D 25 HYDROXY (VIT D DEFICIENCY, FRACTURES): VITD: 39.1 ng/mL (ref 30.00–100.00)

## 2017-11-24 LAB — COMPREHENSIVE METABOLIC PANEL
ALT: 14 U/L (ref 0–35)
AST: 15 U/L (ref 0–37)
Albumin: 4.4 g/dL (ref 3.5–5.2)
Alkaline Phosphatase: 69 U/L (ref 39–117)
BUN: 23 mg/dL (ref 6–23)
CO2: 30 mEq/L (ref 19–32)
Calcium: 9.4 mg/dL (ref 8.4–10.5)
Chloride: 103 mEq/L (ref 96–112)
Creatinine, Ser: 0.89 mg/dL (ref 0.40–1.20)
GFR: 68.74 mL/min (ref >60.00)
Glucose, Bld: 90 mg/dL (ref 70–99)
Potassium: 4.6 mEq/L (ref 3.5–5.1)
Sodium: 140 mEq/L (ref 135–145)
Total Bilirubin: 0.5 mg/dL (ref 0.2–1.2)
Total Protein: 7.4 g/dL (ref 6.0–8.3)

## 2017-11-24 LAB — TSH: TSH: 1.3 u[IU]/mL (ref 0.35–4.50)

## 2017-11-24 NOTE — Telephone Encounter (Signed)
Please enter [facility] lab orders due to Lesia out of office °

## 2017-11-25 LAB — HEPATITIS C ANTIBODY
Hepatitis C Ab: NONREACTIVE
SIGNAL TO CUT-OFF: 0.01 (ref <1.00)

## 2017-11-28 ENCOUNTER — Ambulatory Visit: Payer: Medicare HMO | Admitting: Psychology

## 2017-11-29 ENCOUNTER — Encounter: Payer: Self-pay | Admitting: Family Medicine

## 2017-12-05 ENCOUNTER — Ambulatory Visit: Payer: Medicare HMO | Admitting: Psychology

## 2017-12-05 ENCOUNTER — Ambulatory Visit (INDEPENDENT_AMBULATORY_CARE_PROVIDER_SITE_OTHER): Payer: Medicare HMO | Admitting: Psychology

## 2017-12-05 DIAGNOSIS — F4323 Adjustment disorder with mixed anxiety and depressed mood: Secondary | ICD-10-CM

## 2017-12-06 ENCOUNTER — Ambulatory Visit: Payer: Medicare HMO | Admitting: Psychology

## 2017-12-06 ENCOUNTER — Ambulatory Visit: Payer: Self-pay | Admitting: Psychology

## 2017-12-07 ENCOUNTER — Ambulatory Visit: Payer: Medicare HMO | Admitting: Psychology

## 2017-12-07 ENCOUNTER — Ambulatory Visit (INDEPENDENT_AMBULATORY_CARE_PROVIDER_SITE_OTHER): Payer: Medicare HMO

## 2017-12-07 ENCOUNTER — Ambulatory Visit: Payer: Self-pay | Admitting: Psychology

## 2017-12-07 VITALS — BP 102/76 | HR 75 | Temp 97.8°F | Ht 65.5 in | Wt 206.5 lb

## 2017-12-07 DIAGNOSIS — Z0000 Encounter for general adult medical examination without abnormal findings: Secondary | ICD-10-CM | POA: Diagnosis not present

## 2017-12-07 NOTE — Patient Instructions (Signed)
Rachel Torres , Thank you for taking time to come for your Medicare Wellness Visit. I appreciate your ongoing commitment to your health goals. Please review the following plan we discussed and let me know if I can assist you in the future.   These are the goals we discussed: Goals    . Increase physical activity     When schedule permits, I will attempt to do water aerobics for 30 minutes twice weekly and to resume low-carb diet in an effort to lose weight.        This is a list of the screening recommended for you and due dates:  Health Maintenance  Topic Date Due  . HIV Screening  01/22/2024*  . Tetanus Vaccine  01/09/2019  . Mammogram  02/24/2019  . Pap Smear  11/30/2019  . Colon Cancer Screening  01/19/2023  . Flu Shot  Completed  .  Hepatitis C: One time screening is recommended by Center for Disease Control  (CDC) for  adults born from [birth year] through 1964.   Completed  *Topic was postponed. The date shown is not the original due date.   Preventive Care for Adults  A healthy lifestyle and preventive care can promote health and wellness. Preventive health guidelines for adults include the following key practices.  . A routine yearly physical is a good way to check with your health care provider about your health and preventive screening. It is a chance to share any concerns and updates on your health and to receive a thorough exam.  . Visit your dentist for a routine exam and preventive care every 6 months. Brush your teeth twice a day and floss once a day. Good oral hygiene prevents tooth decay and gum disease.  . The frequency of eye exams is based on your age, health, family medical history, use  of contact lenses, and other factors. Follow your health care provider's recommendations for frequency of eye exams.  . Eat a healthy diet. Foods like vegetables, fruits, whole grains, low-fat dairy products, and lean protein foods contain the nutrients you need without too many  calories. Decrease your intake of foods high in solid fats, added sugars, and salt. Eat the right amount of calories for you. Get information about a proper diet from your health care provider, if necessary.  . Regular physical exercise is one of the most important things you can do for your health. Most adults should get at least 150 minutes of moderate-intensity exercise (any activity that increases your heart rate and causes you to sweat) each week. In addition, most adults need muscle-strengthening exercises on 2 or more days a week.  Silver Sneakers may be a benefit available to you. To determine eligibility, you may visit the website: www.silversneakers.com or contact program at 605-279-9400 Mon-Fri between 8AM-8PM.   . Maintain a healthy weight. The body mass index (BMI) is a screening tool to identify possible weight problems. It provides an estimate of body fat based on height and weight. Your health care provider can find your BMI and can help you achieve or maintain a healthy weight.   For adults 20 years and older: ? A BMI below 18.5 is considered underweight. ? A BMI of 18.5 to 24.9 is normal. ? A BMI of 25 to 29.9 is considered overweight. ? A BMI of 30 and above is considered obese.   . Maintain normal blood lipids and cholesterol levels by exercising and minimizing your intake of saturated fat. Eat a balanced diet with  plenty of fruit and vegetables. Blood tests for lipids and cholesterol should begin at age 81 and be repeated every 5 years. If your lipid or cholesterol levels are high, you are over 50, or you are at high risk for heart disease, you may need your cholesterol levels checked more frequently. Ongoing high lipid and cholesterol levels should be treated with medicines if diet and exercise are not working.  . If you smoke, find out from your health care provider how to quit. If you do not use tobacco, please do not start.  . If you choose to drink alcohol, please do  not consume more than 2 drinks per day. One drink is considered to be 12 ounces (355 mL) of beer, 5 ounces (148 mL) of wine, or 1.5 ounces (44 mL) of liquor.  . If you are 68-39 years old, ask your health care provider if you should take aspirin to prevent strokes.  . Use sunscreen. Apply sunscreen liberally and repeatedly throughout the day. You should seek shade when your shadow is shorter than you. Protect yourself by wearing long sleeves, pants, a wide-brimmed hat, and sunglasses year round, whenever you are outdoors.  . Once a month, do a whole body skin exam, using a mirror to look at the skin on your back. Tell your health care provider of new moles, moles that have irregular borders, moles that are larger than a pencil eraser, or moles that have changed in shape or color.

## 2017-12-07 NOTE — Progress Notes (Signed)
Subjective:   Rachel Torres is a 61 y.o. female who presents for an Initial Medicare Annual Wellness Visit.  Review of Systems    N/A  Cardiac Risk Factors include: obesity (BMI >30kg/m2);hypertension     Objective:    Today's Vitals   12/07/17 1408  BP: 102/76  Pulse: 75  Temp: 97.8 F (36.6 C)  TempSrc: Oral  SpO2: 94%  Weight: 206 lb 8 oz (93.7 kg)  Height: 5' 5.5" (1.664 m)  PainSc: 2   PainLoc: Generalized   Body mass index is 33.84 kg/m.  Advanced Directives 12/07/2017 09/04/2017 01/25/2017 08/07/2016 02/10/2016 02/10/2016 02/09/2016  Does Patient Have a Medical Advance Directive? Yes No Yes Yes Yes Yes No  Type of Paramedic of [place];Living will - [place];Living will Healthcare Power of Attorney Living will Living will -  Does patient want to make changes to medical advance directive? - - - - No - Patient declined No - Patient declined -  Copy of [place] in Chart? No - copy requested - - No - copy requested No - copy requested No - copy requested -  Would patient like information on creating a medical advance directive? - - - - No - patient declined information No - patient declined information Yes - Educational materials given    Current Medications (verified) Outpatient Encounter Medications as of 12/07/2017  Medication Sig  . albuterol (PROVENTIL HFA;VENTOLIN HFA) 108 (90 Base) MCG/ACT inhaler Inhale 1-2 puffs into the lungs every 6 (six) hours as needed for wheezing or shortness of breath.  . ALPRAZolam (XANAX) 0.5 MG tablet Take 1 tablet (0.5 mg total) by mouth daily as needed for anxiety.  Marland Kitchen aspirin EC 81 MG tablet Take 81 mg by mouth at bedtime.  Marland Kitchen atorvastatin (LIPITOR) 20 MG tablet Take 1 tablet (20 mg total) by mouth daily.  . benzonatate (TESSALON) 200 MG capsule Take 1 capsule (200 mg total) by mouth 3 (three) times daily as needed.  . carvedilol (COREG) 6.25 MG tablet TAKE 1 TABLET  (6.25 MG) BY MOUTH 2 (TWO) TIMES DAILY WITH MEALS  . cholecalciferol (VITAMIN D) 1000 units tablet Take 2,000 Units by mouth daily.  . cyclobenzaprine (FLEXERIL) 10 MG tablet Take 0.5-1 tablets (5-10 mg total) by mouth 3 (three) times daily as needed. For headache  . furosemide (LASIX) 20 MG tablet TAKE 1 TABLET (20 MG TOTAL) BY MOUTH DAILY.  Marland Kitchen Glucosamine-Chondroitin (OSTEO BI-FLEX REGULAR STRENGTH PO) Take by mouth daily.  . hydrocortisone 2.5 % cream Apply topically 2 (two) times daily. For maximum 7 days.  Marland Kitchen lisinopril (PRINIVIL,ZESTRIL) 5 MG tablet TAKE 1/2 TABLET (2.5 MG TOTAL) BY MOUTH DAILY.  . magnesium gluconate (MAGONATE) 500 MG tablet Take 500 mg by mouth at bedtime.  . Melatonin 3 MG CAPS Take 1 capsule by mouth at bedtime as needed.  . Misc Natural Products (TART CHERRY ADVANCED) CAPS Take 1 capsule by mouth daily.  . naproxen sodium (ANAPROX) 220 MG tablet Take 440 mg by mouth 2 (two) times daily as needed (for headaches/pain).  . nitroGLYCERIN (NITROSTAT) 0.4 MG SL tablet Place 1 tablet (0.4 mg total) under the tongue every 5 (five) minutes as needed for chest pain.  Marland Kitchen omeprazole (PRILOSEC) 40 MG capsule Take 1 capsule (40 mg total) by mouth daily.  . potassium chloride (K-DUR) 10 MEQ tablet Take 2 tablets (20 mEq total) by mouth daily.  . promethazine (PHENERGAN) 25 MG tablet Take 0.5 tablets (12.5 mg  total) by mouth every 8 (eight) hours as needed for nausea.  Marland Kitchen venlafaxine XR (EFFEXOR-XR) 150 MG 24 hr capsule Take 1 capsule (150 mg total) by mouth daily with breakfast.   No facility-administered encounter medications on file as of 12/07/2017.     Allergies (verified) Fosamax [alendronate sodium]; Ginzing [ginseng]; Hydrocod polst-cpm polst er; Hydrocodone-acetaminophen; Imitrex [sumatriptan]; Oxycodone; Peanut-containing drug products; Tramadol; Hydrocodone-guaifenesin; Prednisone; and Tape   History: Past Medical History:  Diagnosis Date  . AAA (abdominal aortic  aneurysm) ([place])    a. Chronic w/o evidence of aneurysmal dil on CTA 01/2016.  Marland Kitchen Allergy   . Anemia   . Anxiety   . Asthma   . CAD (coronary artery disease)    a. 08/2010 s/p CABG x 1 (VG->RCA) @ time of Ao dissection repair; b. 01/2016 Lexiscan MV: mid antsept/apical defect w/ ? peri-infarct ischemia-->likely attenuation-->Med Rx.  . Chronic combined systolic (congestive) and diastolic (congestive) heart failure ([place])    a. 2012 EF 30-35%; b. 04/2015 EF 40-45%; c. 01/2016 Echo: Ef 55-65%, nl AoV bioprosthesis, nl RV, nl PASP.  Marland Kitchen Depression   . Gallbladder sludge   . GERD (gastroesophageal reflux disease)   . History of Bicuspid Aortic Valve    a. 08/2010 s/p AVR @ time of Ao dissection repair; b. 01/2016 Echo: Ef 55-65%, nl AoV bioprosthesis, nl RV, nl PASP.  Marland Kitchen Hx of repair of dissecting thoracic aortic aneurysm, [place] type A    a. 08/2010 s/p repair with AVR and VG->RCA; b. 01/2016 CTA: stable appearance of Asc Thoracic Aortic graft. Opacification of flase lumen of chronic abd Ao dissection w/ retrograde flow through lumbar arteries. No aneurysmal dil of flase lumen.  . Hyperlipidemia   . Hypertensive heart disease   . IBS (irritable bowel syndrome)   . Internal hemorrhoids   . Marfan syndrome    pt denies  . Migraine   . Obstructive sleep apnea   . Osteoporosis   . Pneumonia   . RA (rheumatoid arthritis) ([place])    a. Followed by Dr. [name]   Past Surgical History:  Procedure Laterality Date  . ABDOMINAL AORTIC ANEURYSM REPAIR    . CARDIAC CATHETERIZATION  2001  . [place]  . CHOLECYSTECTOMY    . COLONOSCOPY    . FRACTURE SURGERY Right    shoulder replacement  . HEMORRHOID BANDING    . ORIF DISTAL RADIUS FRACTURE Left   . UPPER GASTROINTESTINAL ENDOSCOPY     Family History  Problem Relation Age of Onset  . Hypertension Mother   . Depression Mother   . Osteoporosis Mother   . Stroke Mother   . Irritable bowel syndrome Mother   . Asthma Mother   .  Arthritis Sister   . Diabetes Sister   . Heart disease Sister   . Asthma Sister   . Migraines Sister   . Nephrolithiasis Sister   . Osteoporosis Sister   . Arthritis Sister   . Asthma Sister   . Migraines Sister   . Other Father 62  . Stroke Maternal Grandmother   . Colon cancer Maternal Grandmother   . Lymphoma Maternal Grandmother   . Migraines Maternal Grandmother   . Aneurysm Paternal Grandmother        brain  . Diabetes Paternal Grandmother   . Arthritis Paternal Grandmother   . Arthritis Paternal Grandfather   . Diabetes Paternal Grandfather   . Other Brother        prediabeties  . Asthma  Brother   . Migraines Brother   . Lung cancer Maternal Grandfather   . Melanoma Maternal Grandfather    Social History   Socioeconomic History  . Marital status: Married    Spouse name: None  . Number of children: 2  . Years of education: None  . Highest education level: None  Social Needs  . Financial resource strain: None  . Food insecurity - worry: None  . Food insecurity - inability: None  . Transportation needs - medical: None  . Transportation needs - non-medical: None  Occupational History  . Occupation: disabled    Employer: [employer]  Tobacco Use  . Smoking status: Former Smoker    Last attempt to quit: 12/20/1978    Years since quitting: 38.9  . Smokeless tobacco: Never Used  Substance and Sexual Activity  . Alcohol use: No    Alcohol/week: 0.0 oz  . Drug use: No  . Sexual activity: No  Other Topics Concern  . None  Social History Narrative    married for the second time, happily   On disability after dissection of aortic aneurysm   Husband has Parkinson's   4-6 caffeinated beverages daily   04/29/2017       Tobacco Counseling Counseling given: No   Clinical Intake:  Pre-visit preparation completed: Yes  Pain : 0-10 Pain Score: 2  Pain Type: Chronic pain Pain Location: Generalized Pain Onset: More than a month ago Pain Frequency: Constant       Nutritional Status: BMI > 30  Obese Nutritional Risks: None Diabetes: No  How often do you need to have someone help you when you read instructions, pamphlets, or other written materials from your doctor or pharmacy?: 1 - Never What is the last grade level you completed in school?: 12th grade + some college  Interpreter Needed?: No  Comments: pt lives with spouse Information entered by :: LPinson, LPN   Activities of Daily Living In your present state of health, do you have any difficulty performing the following activities: 12/07/2017  Hearing? Y  Vision? N  Difficulty concentrating or making decisions? N  Walking or climbing stairs? Y  Dressing or bathing? N  Doing errands, shopping? N  Preparing Food and eating ? N  Using the Toilet? N  In the past six months, have you accidently leaked urine? N  Do you have problems with loss of bowel control? N  Managing your Medications? N  Managing your Finances? N  Housekeeping or managing your Housekeeping? N  Some recent data might be hidden     Immunizations and Health Maintenance Immunization History  Administered Date(s) Administered  . H1N1 01/09/2009  . Influenza Split 09/20/2011, 09/11/2012  . Influenza Whole 12/29/2006, 09/19/2008, 10/08/2010  . Influenza,inj,Quad PF,6+ Mos 08/13/2016, 10/27/2017  . Pneumococcal Polysaccharide-23 12/29/2006  . Td 01/09/2009   There are no preventive care reminders to display for this patient.  Patient Care Team: Tower, Wynelle Fanny, MD as PCP - General (Family Medicine)  Indicate any recent Medical Services you may have received from other than [facility] providers in the past year (date may be approximate).     Assessment:   This is a routine wellness examination for Rachel Torres.  Hearing/Vision screen  Hearing Screening   125Hz  250Hz  500Hz  1000Hz  2000Hz  3000Hz  4000Hz  6000Hz  8000Hz   Right ear:   40 40 40  40    Left ear:   40 40 40  40    Vision Screening Comments: 2018 LAST VISION  WITH DR.  DAVIS @ [facility]  Dietary issues and exercise activities discussed: Current Exercise Habits: The patient does not participate in regular exercise at present, Exercise limited by: None identified  Goals    . Increase physical activity     When schedule permits, I will attempt to do water aerobics for 30 minutes twice weekly and to resume low-carb diet in an effort to lose weight.       Depression Screen PHQ 2/9 Scores 12/07/2017 06/28/2017 07/05/2016  PHQ - 2 Score 0 1 0  PHQ- 9 Score 0 12 -    Fall Risk Fall Risk  12/07/2017 07/05/2016  Falls in the past year? No No   Cognitive Function: MMSE - Mini Mental State Exam 12/07/2017  Orientation to time 5  Orientation to Place 5  Registration 3  Attention/ Calculation 0  Recall 3  Language- name 2 objects 0  Language- repeat 1  Language- follow 3 step command 3  Language- read & follow direction 0  Write a sentence 0  Copy design 0  Total score 20     PLEASE NOTE: A Mini-Cog screen was completed. Maximum score is 20. A value of 0 denotes this part of [place] MMSE was not completed or the patient failed this part of the Mini-Cog screening.   Mini-Cog Screening Orientation to Time - Max 5 pts Orientation to Place - Max 5 pts Registration - Max 3 pts Recall - Max 3 pts Language Repeat - Max 1 pts Language Follow 3 Step Command - Max 3 pts     Screening Tests Health Maintenance  Topic Date Due  . HIV Screening  01/22/2024 (Originally 10/17/1971)  . TETANUS/TDAP  01/09/2019  . MAMMOGRAM  02/24/2019  . PAP SMEAR  11/30/2019  . COLONOSCOPY  01/19/2023  . INFLUENZA VACCINE  Completed  . Hepatitis C Screening  Completed     Plan:     I have personally reviewed, addressed, and noted the following in the patient's chart:  A. Medical and social history B. Use of alcohol, tobacco or illicit drugs  C. Current medications and supplements D. Functional ability and status E.  Nutritional status F.  Physical  activity G. Advance directives H. List of other physicians I.  Hospitalizations, surgeries, and ER visits in previous 12 months J.  [place] to include hearing, vision, cognitive, depression L. Referrals and appointments - none  In addition, I have reviewed and discussed with patient certain preventive protocols, quality metrics, and best practice recommendations. A written personalized care plan for preventive services as well as general preventive health recommendations were provided to patient.  See attached scanned questionnaire for additional information.   Signed,   Lindell Noe, MHA, BS, LPN Health Coach

## 2017-12-07 NOTE — Progress Notes (Signed)
Pre visit review using our clinic review tool, if applicable. No additional management support is needed unless otherwise documented below in the visit note. 

## 2017-12-07 NOTE — Progress Notes (Signed)
PCP notes:   Health maintenance:  No gaps identified.  Abnormal screenings:   None  Patient concerns:   None  Nurse concerns:  None  Next PCP appt:   12/27/2017 @1515   I reviewed health advisor's note, was available for consultation, and agree with documentation and plan. Loura Pardon MD

## 2017-12-08 ENCOUNTER — Ambulatory Visit: Payer: Medicare HMO | Admitting: Psychology

## 2017-12-08 ENCOUNTER — Ambulatory Visit: Payer: Self-pay | Admitting: Psychology

## 2017-12-09 ENCOUNTER — Ambulatory Visit: Payer: Self-pay | Admitting: Psychology

## 2017-12-09 ENCOUNTER — Ambulatory Visit: Payer: Medicare HMO | Admitting: Psychology

## 2017-12-10 ENCOUNTER — Ambulatory Visit: Payer: Medicare HMO | Admitting: Psychology

## 2017-12-11 ENCOUNTER — Encounter: Payer: Self-pay | Admitting: Cardiovascular Disease

## 2017-12-11 ENCOUNTER — Ambulatory Visit: Payer: Medicare HMO | Admitting: Psychology

## 2017-12-11 DIAGNOSIS — R079 Chest pain, unspecified: Secondary | ICD-10-CM | POA: Diagnosis not present

## 2017-12-11 DIAGNOSIS — F419 Anxiety disorder, unspecified: Secondary | ICD-10-CM | POA: Diagnosis not present

## 2017-12-12 ENCOUNTER — Ambulatory Visit: Payer: Self-pay | Admitting: Psychology

## 2017-12-12 ENCOUNTER — Ambulatory Visit: Payer: Medicare HMO | Admitting: Psychology

## 2017-12-14 ENCOUNTER — Ambulatory Visit: Payer: Medicare HMO | Admitting: Psychology

## 2017-12-14 ENCOUNTER — Ambulatory Visit: Payer: Self-pay | Admitting: Psychology

## 2017-12-15 ENCOUNTER — Other Ambulatory Visit: Payer: Self-pay | Admitting: Family Medicine

## 2017-12-15 ENCOUNTER — Ambulatory Visit: Payer: Self-pay | Admitting: Psychology

## 2017-12-15 ENCOUNTER — Ambulatory Visit: Payer: Medicare HMO | Admitting: Psychology

## 2017-12-15 ENCOUNTER — Other Ambulatory Visit: Payer: Self-pay | Admitting: Cardiovascular Disease

## 2017-12-16 ENCOUNTER — Ambulatory Visit: Payer: Self-pay | Admitting: Psychology

## 2017-12-16 ENCOUNTER — Ambulatory Visit: Payer: Medicare HMO | Admitting: Psychology

## 2017-12-19 ENCOUNTER — Ambulatory Visit: Payer: Self-pay | Admitting: Psychology

## 2017-12-26 ENCOUNTER — Ambulatory Visit (INDEPENDENT_AMBULATORY_CARE_PROVIDER_SITE_OTHER): Payer: Medicare HMO | Admitting: Psychology

## 2017-12-26 DIAGNOSIS — F4323 Adjustment disorder with mixed anxiety and depressed mood: Secondary | ICD-10-CM

## 2017-12-27 ENCOUNTER — Ambulatory Visit (INDEPENDENT_AMBULATORY_CARE_PROVIDER_SITE_OTHER): Payer: Medicare HMO | Admitting: Family Medicine

## 2017-12-27 ENCOUNTER — Encounter: Payer: Self-pay | Admitting: Family Medicine

## 2017-12-27 VITALS — BP 116/74 | HR 84 | Temp 98.3°F | Ht 65.5 in | Wt 203.2 lb

## 2017-12-27 DIAGNOSIS — R739 Hyperglycemia, unspecified: Secondary | ICD-10-CM

## 2017-12-27 DIAGNOSIS — M810 Age-related osteoporosis without current pathological fracture: Secondary | ICD-10-CM | POA: Diagnosis not present

## 2017-12-27 DIAGNOSIS — E669 Obesity, unspecified: Secondary | ICD-10-CM

## 2017-12-27 DIAGNOSIS — I10 Essential (primary) hypertension: Secondary | ICD-10-CM | POA: Diagnosis not present

## 2017-12-27 DIAGNOSIS — E7800 Pure hypercholesterolemia, unspecified: Secondary | ICD-10-CM

## 2017-12-27 DIAGNOSIS — Z0000 Encounter for general adult medical examination without abnormal findings: Secondary | ICD-10-CM

## 2017-12-27 DIAGNOSIS — E559 Vitamin D deficiency, unspecified: Secondary | ICD-10-CM

## 2017-12-27 DIAGNOSIS — I5042 Chronic combined systolic (congestive) and diastolic (congestive) heart failure: Secondary | ICD-10-CM

## 2017-12-27 MED ORDER — OMEPRAZOLE 40 MG PO CPDR: 40.0000 mg | DELAYED_RELEASE_CAPSULE | Freq: Every day | ORAL | 3 refills | Status: DC

## 2017-12-27 MED ORDER — CYCLOBENZAPRINE HCL 10 MG PO TABS: 5.0000 mg | ORAL_TABLET | Freq: Three times a day (TID) | ORAL | 3 refills | Status: DC | PRN

## 2017-12-27 NOTE — Patient Instructions (Addendum)
Try to get most of your carbohydrates from produce (with the exception of white potatoes)  Eat less bread/pasta/rice/snack foods/cereals/sweets and other items from the middle of the grocery store (processed carbs)  Plan your exercise program - pool is optimal   Increase your vitamin D to 4000 iu daily   Check with your pharmacy about the Shingrix vaccine availability and cost -get on a waiting list if you can   For cholesterol   Avoid red meat/ fried foods/ egg yolks/ fatty breakfast meats/ butter, cheese and high fat dairy/ and shellfish   Lets re check it in 6 months We can increase your atorvastatin if needed

## 2017-12-27 NOTE — Progress Notes (Signed)
Subjective:    Patient ID: Rachel Torres, female    DOB: 21-Aug-1956, 62 y.o.   MRN: [id number]  HPI  Here for health maintenance exam and to review chronic medical problems    A lot of stress  Taking care of husband with parkinsons (wants to be able to keep him home)  Waiting to get aid from the [place]  Also dealing with her mother and caring for her  Son went back overseas   She is planning trip to [place] in the fall    Wt Readings from Last 3 Encounters:  12/27/17 203 lb 4 oz (92.2 kg)  12/07/17 206 lb 8 oz (93.7 kg)  11/02/17 207 lb 8 oz (94.1 kg)  wt is down 4 lb (pt states she went up initially from stress)-then went to lower carb  Not exercising yet-making a plan  33.31 kg/m   amw was 12/19  No gaps or issues identified   Mammogram 3/18-ok  Self breast exam -no lumps   Pap 12/17-neg No gyn problems    Colonoscopy 1/14  Plans to get hemorrhoids banded   dexa 3/18 osteopenia Has had 2 falls - working to prevent them (ramp/ one level/open floor plan/ cannot get a walker in bathroom so working on that)  No rugs  No fractures  Intol of fosamax  D level is 39  Zoster status - has had shingles last year   Continues to see counselor / also has caregiver support group   bp is stable today   (in the setting of CHF) sees cardiology /stable  No cp or palpitations or headaches or edema  No side effects to medicines  BP Readings from Last 3 Encounters:  12/27/17 116/74  12/07/17 102/76  11/02/17 102/70     cpap for OSA  Hyperlipidemia  Lab Results  Component Value Date   CHOL 193 11/24/2017   CHOL 132 11/23/2016   CHOL 144 10/27/2015   Lab Results  Component Value Date   HDL 63.20 11/24/2017   HDL 45.80 11/23/2016   HDL 46 10/27/2015   Lab Results  Component Value Date   LDLCALC 114 (H) 11/24/2017   LDLCALC 69 11/23/2016   LDLCALC 81 10/27/2015   Lab Results  Component Value Date   TRIG 78.0 11/24/2017   TRIG 86.0 11/23/2016   TRIG 87  10/27/2015   Lab Results  Component Value Date   CHOLHDL 3 11/24/2017   CHOLHDL 3 11/23/2016   CHOLHDL 3.1 10/27/2015   Lab Results  Component Value Date   LDLDIRECT 189.4 10/13/2011   LDLDIRECT 163.3 05/05/2009   LDLDIRECT 146.4 01/09/2009   lipitor  and diet (was prev on crestor)  Was eating poorly for a while  On 20 mg daily  Wants to try diet - re check in 6 months    Hyperglycemia Lab Results  Component Value Date   HGBA1C 6.0 11/24/2017   Was 5.5  - up from poor eating  Lots of sweets  Has stopped it   Lab Results  Component Value Date   WBC 6.8 11/24/2017   HGB 12.9 11/24/2017   HCT 39.2 11/24/2017   MCV 97.1 11/24/2017   PLT 267.0 11/24/2017   Lab Results  Component Value Date   CREATININE 0.89 11/24/2017   BUN 23 11/24/2017   NA 140 11/24/2017   K 4.6 11/24/2017   CL 103 11/24/2017   CO2 30 11/24/2017   Lab Results  Component Value Date   ALT 14  11/24/2017   AST 15 11/24/2017   ALKPHOS 69 11/24/2017   BILITOT 0.5 11/24/2017    Lab Results  Component Value Date   TSH 1.30 11/24/2017     Patient Active Problem List   Diagnosis Date Noted  . Cough 09/08/2017  . Type 1 dissection of ascending aorta ([place]) 04/23/2017  . HPV (human papilloma virus) infection 12/01/2016  . Screening mammogram, encounter for 11/29/2016  . Estrogen deficiency 11/29/2016  . Encounter for routine gynecological examination 11/29/2016  . Grade III hemorrhoids 10/06/2016  . Tachycardia 08/14/2016  . Hemorrhoids 08/13/2016  . Atypical migraine 08/03/2016  . Sweating 07/12/2016  . CAD (coronary artery disease)   . AAA (abdominal aortic aneurysm) ([place])   . Chronic combined systolic (congestive) and diastolic (congestive) heart failure ([place])   . Hyperglycemia 02/11/2016  . Generalized anxiety disorder 12/08/2015  . Panic attacks 07/28/2015  . Sensorineural hearing loss 03/27/2013  . Vitamin D deficiency 07/03/2012  . Aortic dissection ([place]) 05/12/2012  . Obesity  (BMI 30.0-34.9) 11/21/2011  . Routine general medical examination at a health care facility 09/20/2011  . S/P AVR (aortic valve replacement) 01/07/2011  . CORONARY ARTERY BYPASS GRAFT, HX OF 01/07/2011  . Arthritis 12/20/2010  . H/O dissecting abdominal aortic aneurysm repair 08/24/2010  . IRRITABLE BOWEL SYNDROME 09/09/2009  . Obstructive sleep apnea 08/04/2009  . External hemorrhoid 06/26/2009  . Osteoporosis 01/09/2009  . Hyperlipidemia 07/26/2007  . Depression with anxiety 07/26/2007  . Essential hypertension 07/26/2007  . Allergic rhinitis 07/26/2007  . Asthma 07/26/2007  . GERD 07/26/2007  . Osteoarthritis 07/26/2007  . Ocular migraine 07/26/2007  . Mild intermittent asthma without complication 11/94/1740   Past Medical History:  Diagnosis Date  . AAA (abdominal aortic aneurysm) ([place])    a. Chronic w/o evidence of aneurysmal dil on CTA 01/2016.  Marland Kitchen Allergy   . Anemia   . Anxiety   . Asthma   . CAD (coronary artery disease)    a. 08/2010 s/p CABG x 1 (VG->RCA) @ time of Ao dissection repair; b. 01/2016 Lexiscan MV: mid antsept/apical defect w/ ? peri-infarct ischemia-->likely attenuation-->Med Rx.  . Chronic combined systolic (congestive) and diastolic (congestive) heart failure ([place])    a. 2012 EF 30-35%; b. 04/2015 EF 40-45%; c. 01/2016 Echo: Ef 55-65%, nl AoV bioprosthesis, nl RV, nl PASP.  Marland Kitchen Depression   . Gallbladder sludge   . GERD (gastroesophageal reflux disease)   . History of Bicuspid Aortic Valve    a. 08/2010 s/p AVR @ time of Ao dissection repair; b. 01/2016 Echo: Ef 55-65%, nl AoV bioprosthesis, nl RV, nl PASP.  Marland Kitchen Hx of repair of dissecting thoracic aortic aneurysm, [place] type A    a. 08/2010 s/p repair with AVR and VG->RCA; b. 01/2016 CTA: stable appearance of Asc Thoracic Aortic graft. Opacification of flase lumen of chronic abd Ao dissection w/ retrograde flow through lumbar arteries. No aneurysmal dil of flase lumen.  . Hyperlipidemia   . Hypertensive heart  disease   . IBS (irritable bowel syndrome)   . Internal hemorrhoids   . Marfan syndrome    pt denies  . Migraine   . Obstructive sleep apnea   . Osteoporosis   . Pneumonia   . RA (rheumatoid arthritis) ([place])    a. Followed by Dr. [name]   Past Surgical History:  Procedure Laterality Date  . ABDOMINAL AORTIC ANEURYSM REPAIR    . CARDIAC CATHETERIZATION  2001  . [place]  . CHOLECYSTECTOMY    .  COLONOSCOPY    . FRACTURE SURGERY Right    shoulder replacement  . HEMORRHOID BANDING    . ORIF DISTAL RADIUS FRACTURE Left   . UPPER GASTROINTESTINAL ENDOSCOPY     Social History   Tobacco Use  . Smoking status: Former Smoker    Last attempt to quit: 12/20/1978    Years since quitting: 39.0  . Smokeless tobacco: Never Used  Substance Use Topics  . Alcohol use: No    Alcohol/week: 0.0 oz  . Drug use: No   Family History  Problem Relation Age of Onset  . Hypertension Mother   . Depression Mother   . Osteoporosis Mother   . Stroke Mother   . Irritable bowel syndrome Mother   . Asthma Mother   . Arthritis Sister   . Diabetes Sister   . Heart disease Sister   . Asthma Sister   . Migraines Sister   . Nephrolithiasis Sister   . Osteoporosis Sister   . Arthritis Sister   . Asthma Sister   . Migraines Sister   . Other Father 49  . Stroke Maternal Grandmother   . Colon cancer Maternal Grandmother   . Lymphoma Maternal Grandmother   . Migraines Maternal Grandmother   . Aneurysm Paternal Grandmother        brain  . Diabetes Paternal Grandmother   . Arthritis Paternal Grandmother   . Arthritis Paternal Grandfather   . Diabetes Paternal Grandfather   . Other Brother        prediabeties  . Asthma Brother   . Migraines Brother   . Lung cancer Maternal Grandfather   . Melanoma Maternal Grandfather    Allergies  Allergen Reactions  . Fosamax [Alendronate Sodium]     GI upset   . Ginzing [Ginseng] Other (See Comments)    Hyper and jitery   .  Hydrocod Polst-Cpm Polst Er Itching  . Hydrocodone-Acetaminophen Itching  . Imitrex [Sumatriptan]     Contraindicated w/ heart condition  . Oxycodone Itching  . Peanut-Containing Drug Products Other (See Comments)    Reaction:  Migraines   . Tramadol Other (See Comments)    itching  . Hydrocodone-Guaifenesin Itching  . Prednisone Rash  . Tape Itching   Current Outpatient Medications on File Prior to Visit  Medication Sig Dispense Refill  . albuterol (PROVENTIL HFA;VENTOLIN HFA) 108 (90 Base) MCG/ACT inhaler Inhale 1-2 puffs into the lungs every 6 (six) hours as needed for wheezing or shortness of breath. 1 Inhaler 0  . ALPRAZolam (XANAX) 0.5 MG tablet Take 1 tablet (0.5 mg total) by mouth daily as needed for anxiety. 30 tablet 0  . aspirin EC 81 MG tablet Take 81 mg by mouth at bedtime.    Marland Kitchen atorvastatin (LIPITOR) 20 MG tablet TAKE 1 TABLET EVERY DAY 90 tablet 0  . benzonatate (TESSALON) 200 MG capsule Take 1 capsule (200 mg total) by mouth 3 (three) times daily as needed. 30 capsule 0  . carvedilol (COREG) 6.25 MG tablet TAKE 1 TABLET (6.25 MG) BY MOUTH 2 (TWO) TIMES DAILY WITH MEALS 180 tablet 0  . cholecalciferol (VITAMIN D) 1000 units tablet Take 2,000 Units by mouth daily.    . furosemide (LASIX) 20 MG tablet TAKE 1 TABLET (20 MG TOTAL) BY MOUTH DAILY. 90 tablet 1  . hydrocortisone 2.5 % cream Apply topically 2 (two) times daily. For maximum 7 days. 28 g 0  . Krill Oil (OMEGA-3) 500 MG CAPS Take 1 capsule by mouth daily.    Marland Kitchen  lisinopril (PRINIVIL,ZESTRIL) 5 MG tablet TAKE 1/2 TABLET (2.5 MG TOTAL) BY MOUTH DAILY. 45 tablet 3  . magnesium gluconate (MAGONATE) 500 MG tablet Take 500 mg by mouth at bedtime.    . Melatonin 3 MG CAPS Take 1 capsule by mouth at bedtime as needed.    . Misc Natural Products (TART CHERRY ADVANCED) CAPS Take 1 capsule by mouth daily.    . naproxen sodium (ANAPROX) 220 MG tablet Take 440 mg by mouth 2 (two) times daily as needed (for headaches/pain).    .  nitroGLYCERIN (NITROSTAT) 0.4 MG SL tablet Place 1 tablet (0.4 mg total) under the tongue every 5 (five) minutes as needed for chest pain. 25 tablet 3  . potassium chloride (K-DUR) 10 MEQ tablet Take 2 tablets (20 mEq total) by mouth daily. 180 tablet 3  . potassium chloride (K-DUR,KLOR-CON) 10 MEQ tablet TAKE 1 TABLET (10 MEQ) BY MOUTH 2 (TWO) TIMES DAILY AS NEEDED. 180 tablet 0  . promethazine (PHENERGAN) 25 MG tablet Take 0.5 tablets (12.5 mg total) by mouth every 8 (eight) hours as needed for nausea. 30 tablet 5  . venlafaxine XR (EFFEXOR-XR) 150 MG 24 hr capsule Take 1 capsule (150 mg total) by mouth daily with breakfast. 90 capsule 3   No current facility-administered medications on file prior to visit.     Review of Systems  Constitutional: Positive for fatigue. Negative for activity change, appetite change, fever and unexpected weight change.  HENT: Negative for congestion, ear pain, rhinorrhea, sinus pressure and sore throat.   Eyes: Negative for pain, redness and visual disturbance.  Respiratory: Negative for cough, shortness of breath and wheezing.   Cardiovascular: Negative for chest pain and palpitations.  Gastrointestinal: Negative for abdominal pain, blood in stool, constipation and diarrhea.       Pos for hemorrhoids   Endocrine: Negative for polydipsia and polyuria.  Genitourinary: Negative for dysuria, frequency and urgency.  Musculoskeletal: Positive for arthralgias and back pain. Negative for myalgias.  Skin: Negative for pallor and rash.  Allergic/Immunologic: Negative for environmental allergies.  Neurological: Negative for dizziness, syncope and headaches.  Hematological: Negative for adenopathy. Does not bruise/bleed easily.  Psychiatric/Behavioral: Positive for dysphoric mood. Negative for decreased concentration. The patient is not nervous/anxious.        Pos for caregiver stress       Objective:   Physical Exam  Constitutional: She appears well-developed and  well-nourished. No distress.  obese and well appearing   HENT:  Head: Normocephalic and atraumatic.  Right Ear: External ear normal.  Left Ear: External ear normal.  Mouth/Throat: Oropharynx is clear and moist.  Eyes: Conjunctivae and EOM are normal. Pupils are equal, round, and reactive to light. No scleral icterus.  Neck: Normal range of motion. Neck supple. No JVD present. Carotid bruit is not present. No thyromegaly present.  Cardiovascular: Normal rate, regular rhythm, normal heart sounds and intact distal pulses. Exam reveals no gallop.  Pulmonary/Chest: Effort normal and breath sounds normal. No respiratory distress. She has no wheezes. She exhibits no tenderness.  Abdominal: Soft. Bowel sounds are normal. She exhibits no distension, no abdominal bruit and no mass. There is no tenderness.  Baseline scars   Genitourinary: No breast swelling, tenderness, discharge or bleeding.  Genitourinary Comments: Breast exam: No mass, nodules, thickening, tenderness, bulging, retraction, inflamation, nipple discharge or skin changes noted.  No axillary or clavicular LA.      Musculoskeletal: Normal range of motion. She exhibits no edema or tenderness.  Lymphadenopathy:  She has no cervical adenopathy.  Neurological: She is alert. She has normal reflexes. No cranial nerve deficit. She exhibits normal muscle tone. Coordination normal.  Skin: Skin is warm and dry. No rash noted. No erythema. No pallor.  Solar lentigines diffusely   Psychiatric: She has a normal mood and affect.  Seems tired and stressed in general Talkative           Assessment & Plan:   Problem List Items Addressed This Visit      Cardiovascular and Mediastinum   Chronic combined systolic (congestive) and diastolic (congestive) heart failure ([place])    No clinical changes Cardiology note rev      Essential hypertension    bp in fair control at this time  BP Readings from Last 1 Encounters:  12/27/17 116/74   No  changes needed Disc lifstyle change with low sodium diet and exercise  Labs reviewed         Musculoskeletal and Integument   Osteoporosis    Rev dexa -osteopenia  No fx  Has fallen-disc fall precautions  Intol of fosamax Not interested in other tx at this time  Disc imp of ca and D and exercise        Other   Hyperglycemia    Lab Results  Component Value Date   HGBA1C 6.0 11/24/2017   This is up with poor diet disc imp of low glycemic diet and wt loss to prevent DM2  F/u 6 mo      Hyperlipidemia    lipitor and diet  LDL is up  Disc goals for lipids and reasons to control them Rev labs with pt Rev low sat fat diet in detail Pt wants to work on diet rather than inc her statin dose      Obesity (BMI 30.0-34.9)    Discussed how this problem influences overall health and the risks it imposes  Reviewed plan for weight loss with lower calorie diet (via better food choices and also portion control or program like weight watchers) and exercise building up to or more than 30 minutes 5 days per week including some aerobic activity         Routine general medical examination at a health care facility - Primary    Reviewed health habits including diet and exercise and skin cancer prevention Reviewed appropriate screening tests for age  Also reviewed health mt list, fam hx and immunization status , as well as social and family history   See HPI amw rev Lab rev  Plans to get back on track with healthy diet and exercise as tolerated  Plans to get on wait list for shingrix       Vitamin D deficiency    Level in 30s- desired in 29s  Will inc daily dose to 4000 iu Disc imp to bone and overall health

## 2017-12-28 NOTE — Assessment & Plan Note (Signed)
Level in 30s- desired in 75s  Will inc daily dose to 4000 iu Disc imp to bone and overall health

## 2017-12-28 NOTE — Assessment & Plan Note (Signed)
lipitor and diet  LDL is up  Disc goals for lipids and reasons to control them Rev labs with pt Rev low sat fat diet in detail Pt wants to work on diet rather than inc her statin dose

## 2017-12-28 NOTE — Assessment & Plan Note (Signed)
Lab Results  Component Value Date   HGBA1C 6.0 11/24/2017   This is up with poor diet disc imp of low glycemic diet and wt loss to prevent DM2  F/u 6 mo

## 2017-12-28 NOTE — Assessment & Plan Note (Signed)
Discussed how this problem influences overall health and the risks it imposes  Reviewed plan for weight loss with lower calorie diet (via better food choices and also portion control or program like weight watchers) and exercise building up to or more than 30 minutes 5 days per week including some aerobic activity    

## 2017-12-28 NOTE — Assessment & Plan Note (Signed)
bp in fair control at this time  BP Readings from Last 1 Encounters:  12/27/17 116/74   No changes needed Disc lifstyle change with low sodium diet and exercise  Labs reviewed

## 2017-12-28 NOTE — Assessment & Plan Note (Signed)
Rev dexa -osteopenia  No fx  Has fallen-disc fall precautions  Intol of fosamax Not interested in other tx at this time  Disc imp of ca and D and exercise

## 2017-12-28 NOTE — Assessment & Plan Note (Signed)
Reviewed health habits including diet and exercise and skin cancer prevention Reviewed appropriate screening tests for age  Also reviewed health mt list, fam hx and immunization status , as well as social and family history   See HPI amw rev Lab rev  Plans to get back on track with healthy diet and exercise as tolerated  Plans to get on wait list for shingrix

## 2017-12-28 NOTE — Assessment & Plan Note (Signed)
No clinical changes Cardiology note rev

## 2018-01-02 ENCOUNTER — Ambulatory Visit: Payer: Medicare HMO | Admitting: Psychology

## 2018-01-03 DIAGNOSIS — M5412 Radiculopathy, cervical region: Secondary | ICD-10-CM | POA: Diagnosis not present

## 2018-01-06 ENCOUNTER — Ambulatory Visit (INDEPENDENT_AMBULATORY_CARE_PROVIDER_SITE_OTHER): Payer: Medicare HMO | Admitting: Psychology

## 2018-01-06 DIAGNOSIS — F4323 Adjustment disorder with mixed anxiety and depressed mood: Secondary | ICD-10-CM | POA: Diagnosis not present

## 2018-01-08 NOTE — Progress Notes (Signed)
Cardiology Office Note  Date:  01/11/2018   ID:  Rachel Torres, DOB November 21, 1956, MRN [id number]  PCP:  [name], MD   Chief Complaint  Patient presents with  . OTHER    OD 6 month f/u c/o chest pain. Meds reviewed verbally with pt.    HPI:  Rachel Torres is a very pleasant 62 year-old woman who suffered an aorta type A dissection in September 2011   rushed to [facility] and underwent aortic valve replacement,  bypass of the right coronary artery with venous donor site from her right thigh,  aortic grafting for a extensive dissection of the  ascending aorta, residual descending aortic dissection extending into the iliac artery,  chronic back pain, previous symptoms of shortness of breath,  abdominal pain,  hypertension  She reports pathology of her aortic valve showed bicuspid valve She presents today for follow-up of her aortic valve replacement and CABG.  Since her last clinic visit she reports having episode of Chest pain, Was driving, pulled over, called EMT Date of episode December 11, 2017 Resolved 30 min later, 8/10 in intensity Resolved without intervention EKG was ok Typically takes GI cocktail when she has the symptoms.  Has been running low on her cocktail, requesting a refill Reports that she had significant stress with dinner party planned the next day.  Wonders if that may have triggered the event   takes GI cocktail for severe symptoms Once a month or so has severe reflux symptoms   Eating "bad" Total chol 132 up to 190s  seen by vascular surgery , Dr. Donnetta Hutching, seen 01/2017  recommendation was for f/u in 2 years with CT scan of her chest abdomen and pelvis.  Echo 01/2016, normal EF >55%  Otherwise no new symptoms no regular exercise program Taking care of her husband who has Parkinson's   continues to lose weight intentionally.   Previous  30 day monitor ordered for palpitations,  APCs and PVCs Heart rate was not particularly elevated on low-dose  carvedilol  EKG personally reviewed by myself on todays visit Shows normal sinus rhythm rate 71 bpm nonspecific ST and T wave abnormality anterior precordial leads no change from previous EKGs,   EKG from EMTs reviewed showing normal sinus rhythm with no significant changes from above   Other past medical history reviewed Echocardiogram May 2016 shows slightly improved cardiac function, ejection fraction 40-45%.   diagnosed with obstructive sleep apnea. She has a CPAP machine but she is not using it.  Previous evaluations in the emergency room for chest pain. Cardiac enzymes were negative , EKG unchanged .She had a CT scan dated 06/20/2013. She was transferred to [facility] given her complicated anatomy. images were evaluated by vascular surgery it was determined there have been no significant progression of her disease  Previous evaluation at [facility] for shortness of breath and chest discomfort. She had workup for cardiac issues including echocardiogram and PET stress, CTA of the chest showing stable descending aorta repair with type B. aortic dissection with opacification of the false lumen at the level of the renal arteries via a lumbar artery.  Echocardiogram showed ejection fraction had improved to 45-50%, mild MR, TR (ejection fraction improved from 30-35% in January 2012)  Previous H/o abdominal pain, further workup showing biliary stones and sludge. s/p  laparoscopic cholecystectomy at [facility].  She takes Lasix for urinary retention.   PMH:   has a past medical history of AAA (abdominal aortic aneurysm) ([place]), Allergy, Anemia, Anxiety, Asthma,  CAD (coronary artery disease), Chronic combined systolic (congestive) and diastolic (congestive) heart failure (HCC), Depression, Gallbladder sludge, GERD (gastroesophageal reflux disease), History of Bicuspid Aortic Valve, repair of dissecting thoracic aortic aneurysm, Stanford type A, Hyperlipidemia, Hypertensive heart disease, IBS (irritable bowel  syndrome), Internal hemorrhoids, Marfan syndrome, Migraine, Obstructive sleep apnea, Osteoporosis, Pneumonia, and RA (rheumatoid arthritis) ([place]).  PSH:    Past Surgical History:  Procedure Laterality Date  . ABDOMINAL AORTIC ANEURYSM REPAIR    . CARDIAC CATHETERIZATION  2001  . [place]  . CHOLECYSTECTOMY    . COLONOSCOPY    . FRACTURE SURGERY Right    shoulder replacement  . HEMORRHOID BANDING    . ORIF DISTAL RADIUS FRACTURE Left   . UPPER GASTROINTESTINAL ENDOSCOPY      Current Outpatient Medications  Medication Sig Dispense Refill  . albuterol (PROVENTIL HFA;VENTOLIN HFA) 108 (90 Base) MCG/ACT inhaler Inhale 1-2 puffs into the lungs every 6 (six) hours as needed for wheezing or shortness of breath. 1 Inhaler 0  . ALPRAZolam (XANAX) 0.5 MG tablet Take 1 tablet (0.5 mg total) by mouth daily as needed for anxiety. 30 tablet 0  . aspirin EC 81 MG tablet Take 81 mg by mouth at bedtime.    Marland Kitchen atorvastatin (LIPITOR) 20 MG tablet TAKE 1 TABLET EVERY DAY 90 tablet 0  . benzonatate (TESSALON) 200 MG capsule Take 1 capsule (200 mg total) by mouth 3 (three) times daily as needed. 30 capsule 0  . carvedilol (COREG) 6.25 MG tablet TAKE 1 TABLET (6.25 MG) BY MOUTH 2 (TWO) TIMES DAILY WITH MEALS 180 tablet 0  . cholecalciferol (VITAMIN D) 1000 units tablet Take 4,000 Units by mouth daily.     . cyclobenzaprine (FLEXERIL) 10 MG tablet Take 0.5-1 tablets (5-10 mg total) by mouth 3 (three) times daily as needed. For headache 90 tablet 3  . furosemide (LASIX) 20 MG tablet TAKE 1 TABLET (20 MG TOTAL) BY MOUTH DAILY. 90 tablet 1  . hydrocortisone 2.5 % cream Apply topically 2 (two) times daily. For maximum 7 days. 28 g 0  . Krill Oil (OMEGA-3) 500 MG CAPS Take 1 capsule by mouth daily.    Marland Kitchen lisinopril (PRINIVIL,ZESTRIL) 5 MG tablet TAKE 1/2 TABLET (2.5 MG TOTAL) BY MOUTH DAILY. 45 tablet 3  . magnesium gluconate (MAGONATE) 500 MG tablet Take 500 mg by mouth at bedtime.    .  Melatonin 3 MG CAPS Take 1 capsule by mouth at bedtime as needed.    . Misc Natural Products (TART CHERRY ADVANCED) CAPS Take 1 capsule by mouth daily.    . naproxen sodium (ANAPROX) 220 MG tablet Take 440 mg by mouth 2 (two) times daily as needed (for headaches/pain).    . nitroGLYCERIN (NITROSTAT) 0.4 MG SL tablet Place 1 tablet (0.4 mg total) under the tongue every 5 (five) minutes as needed for chest pain. 25 tablet 3  . omeprazole (PRILOSEC) 40 MG capsule Take 1 capsule (40 mg total) by mouth daily. 90 capsule 3  . potassium chloride (K-DUR,KLOR-CON) 10 MEQ tablet TAKE 1 TABLET (10 MEQ) BY MOUTH 2 (TWO) TIMES DAILY AS NEEDED. 180 tablet 0  . promethazine (PHENERGAN) 25 MG tablet Take 0.5 tablets (12.5 mg total) by mouth every 8 (eight) hours as needed for nausea. 30 tablet 5  . venlafaxine XR (EFFEXOR-XR) 150 MG 24 hr capsule Take 1 capsule (150 mg total) by mouth daily with breakfast. 90 capsule 3   No current facility-administered medications for this visit.  Allergies:   Fosamax [alendronate sodium]; Ginzing [ginseng]; Hydrocod polst-cpm polst er; Hydrocodone-acetaminophen; Imitrex [sumatriptan]; Oxycodone; Peanut-containing drug products; Tramadol; Hydrocodone-guaifenesin; Prednisone; and Tape   Social History:  The patient  reports that she quit smoking about 39 years ago. she has never used smokeless tobacco. She reports that she does not drink alcohol or use drugs.   Family History:   family history includes Aneurysm in her paternal grandmother; Arthritis in her paternal grandfather, paternal grandmother, sister, and sister; Asthma in her brother, mother, sister, and sister; Colon cancer in her maternal grandmother; Depression in her mother; Diabetes in her paternal grandfather, paternal grandmother, and sister; Heart disease in her sister; Hypertension in her mother; Irritable bowel syndrome in her mother; Lung cancer in her maternal grandfather; Lymphoma in her maternal  grandmother; Melanoma in her maternal grandfather; Migraines in her brother, maternal grandmother, sister, and sister; Nephrolithiasis in her sister; Osteoporosis in her mother and sister; Other in her brother; Other (age of onset: 78) in her father; Stroke in her maternal grandmother and mother.    Review of Systems: Review of Systems  Constitutional: Negative.   Respiratory: Negative.   Cardiovascular: Positive for chest pain.  Gastrointestinal: Negative.   Musculoskeletal: Negative.   Neurological: Negative.   Psychiatric/Behavioral: Negative.   All other systems reviewed and are negative.    PHYSICAL EXAM: VS:  BP 110/70 (BP Location: Left Arm, Patient Position: Sitting, Cuff Size: Normal)   Pulse 71   Ht 5\' 5"  (1.651 m)   Wt 204 lb 12 oz (92.9 kg)   BMI 34.07 kg/m  , BMI Body mass index is 34.07 kg/m. GEN: Well nourished, well developed, in no acute distress  HEENT: normal  Neck: no JVD, carotid bruits, or masses Cardiac: RRR; no murmurs, rubs, or gallops,no edema  Respiratory:  clear to auscultation bilaterally, normal work of breathing GI: soft, nontender, nondistended, + BS MS: no deformity or atrophy  Skin: warm and dry, no rash Neuro:  Strength and sensation are intact Psych: euthymic mood, full affect    Recent Labs: 11/24/2017: ALT 14; BUN 23; Creatinine, Ser 0.89; Hemoglobin 12.9; Platelets 267.0; Potassium 4.6; Sodium 140; TSH 1.30    Lipid Panel Lab Results  Component Value Date   CHOL 193 11/24/2017   HDL 63.20 11/24/2017   LDLCALC 114 (H) 11/24/2017   TRIG 78.0 11/24/2017      Wt Readings from Last 3 Encounters:  01/11/18 204 lb 12 oz (92.9 kg)  12/27/17 203 lb 4 oz (92.2 kg)  12/07/17 206 lb 8 oz (93.7 kg)       ASSESSMENT AND PLAN:  Pure hypercholesterolemia Recommend she closely watch her diet, stay on her Lipitor Cholesterol numbers did climb significantly She reports dietary noncompliance  Essential hypertension - Plan: EKG  12-Lead Blood pressure is well controlled on today's visit. No changes made to the medications. Stable  CORONARY ARTERY BYPASS GRAFT, HX OF - Plan: EKG 12-Lead Currently with no symptoms of angina. No further workup at this time. Continue current medication regimen.  Recent episode of chest pain  EKG reviewed, no further symptoms atypical presentation   Dissection of thoracoabdominal aorta ([place]) Followed by vascular surgeryin , Dr. Donnetta Hutching   Repeat scan scheduled for 2020  If she has recurrent chest pain symptoms we would order MRI  Coronary artery disease involving native coronary artery of native heart without angina pectoris - Plan: EKG 12-Lead Currently with no symptoms of angina. No further workup at this time. Continue current medication regimen.  Stable  H/O dissecting abdominal aortic aneurysm repair - Plan: EKG 12-Lead We will order MRI for further chest pain symptoms consider repeat scan in 2020.  Echocardiogram ordered to evaluate aorta and prosthetic valve   Total encounter time more than 25 minutes  Greater than 50% was spent in counseling and coordination of care with the patient   Disposition:   F/U  6 months   Orders Placed This Encounter  Procedures  . EKG 12-Lead     Signed, Esmond Plants, M.D., Ph.D. 01/11/2018  [place], [place]

## 2018-01-09 ENCOUNTER — Ambulatory Visit (INDEPENDENT_AMBULATORY_CARE_PROVIDER_SITE_OTHER): Payer: Medicare HMO | Admitting: Psychology

## 2018-01-09 DIAGNOSIS — F4323 Adjustment disorder with mixed anxiety and depressed mood: Secondary | ICD-10-CM | POA: Diagnosis not present

## 2018-01-10 ENCOUNTER — Encounter: Payer: Self-pay | Admitting: Internal Medicine

## 2018-01-11 ENCOUNTER — Encounter: Payer: Self-pay | Admitting: Cardiovascular Disease

## 2018-01-11 ENCOUNTER — Ambulatory Visit (INDEPENDENT_AMBULATORY_CARE_PROVIDER_SITE_OTHER): Payer: Medicare HMO | Admitting: Cardiovascular Disease

## 2018-01-11 VITALS — BP 110/70 | HR 71 | Ht 65.0 in | Wt 204.8 lb

## 2018-01-11 DIAGNOSIS — Z952 Presence of prosthetic heart valve: Secondary | ICD-10-CM | POA: Diagnosis not present

## 2018-01-11 DIAGNOSIS — I7103 Dissection of thoracoabdominal aorta: Secondary | ICD-10-CM

## 2018-01-11 DIAGNOSIS — I10 Essential (primary) hypertension: Secondary | ICD-10-CM

## 2018-01-11 DIAGNOSIS — I5042 Chronic combined systolic (congestive) and diastolic (congestive) heart failure: Secondary | ICD-10-CM | POA: Diagnosis not present

## 2018-01-11 DIAGNOSIS — E7800 Pure hypercholesterolemia, unspecified: Secondary | ICD-10-CM

## 2018-01-11 NOTE — Patient Instructions (Addendum)
Medication Instructions:   No medication changes made  [place]   Labwork:  No new labs needed  Testing/Procedures:  We will order an echo for aortic valve prothesis/bioprosthetic Aorta dissection   Follow-Up: It was a pleasure seeing you in the office today. Please call us if you have new issues that need to be addressed before your next appt.  [phone]  Your physician wants you to follow-up in: 12 months.  You will receive a reminder letter in the mail two months in advance. If you don't receive a letter, please call our office to schedule the follow-up appointment.  If you need a refill on your cardiac medications before your next appointment, please call your pharmacy.

## 2018-01-11 NOTE — Progress Notes (Signed)
Compound medication called in to [facility] in [place]. It was a GI cocktail with Carafate 100 mg/ml suspension, Lidocaine 2% 100 ml, and antacid 100 ml total of 300 ml with 1:1:1. Pharmacy information provided to patient and advised that they would call her when it is ready to be picked up.

## 2018-01-18 DIAGNOSIS — M4802 Spinal stenosis, cervical region: Secondary | ICD-10-CM | POA: Diagnosis not present

## 2018-01-18 DIAGNOSIS — M5412 Radiculopathy, cervical region: Secondary | ICD-10-CM | POA: Diagnosis not present

## 2018-01-20 ENCOUNTER — Other Ambulatory Visit: Payer: Self-pay | Admitting: Cardiovascular Disease

## 2018-01-20 ENCOUNTER — Other Ambulatory Visit: Payer: Self-pay

## 2018-01-20 ENCOUNTER — Ambulatory Visit (INDEPENDENT_AMBULATORY_CARE_PROVIDER_SITE_OTHER): Payer: Medicare HMO

## 2018-01-20 DIAGNOSIS — I7103 Dissection of thoracoabdominal aorta: Secondary | ICD-10-CM | POA: Diagnosis not present

## 2018-01-20 DIAGNOSIS — Z952 Presence of prosthetic heart valve: Secondary | ICD-10-CM | POA: Diagnosis not present

## 2018-01-21 ENCOUNTER — Encounter: Payer: Self-pay | Admitting: Emergency Medicine

## 2018-01-21 ENCOUNTER — Emergency Department
Admission: EM | Admit: 2018-01-21 | Discharge: 2018-01-22 | Disposition: A | Discharge status: Home / Self Care | Payer: Medicare HMO | Attending: Emergency Medicine | Admitting: Emergency Medicine

## 2018-01-21 ENCOUNTER — Other Ambulatory Visit: Payer: Self-pay

## 2018-01-21 ENCOUNTER — Emergency Department: Payer: Medicare HMO

## 2018-01-21 DIAGNOSIS — Z79899 Other long term (current) drug therapy: Secondary | ICD-10-CM | POA: Diagnosis not present

## 2018-01-21 DIAGNOSIS — Z87891 Personal history of nicotine dependence: Secondary | ICD-10-CM | POA: Insufficient documentation

## 2018-01-21 DIAGNOSIS — I259 Chronic ischemic heart disease, unspecified: Secondary | ICD-10-CM | POA: Diagnosis not present

## 2018-01-21 DIAGNOSIS — J45909 Unspecified asthma, uncomplicated: Secondary | ICD-10-CM | POA: Diagnosis not present

## 2018-01-21 DIAGNOSIS — I4891 Unspecified atrial fibrillation: Secondary | ICD-10-CM | POA: Insufficient documentation

## 2018-01-21 DIAGNOSIS — I110 Hypertensive heart disease with heart failure: Secondary | ICD-10-CM | POA: Insufficient documentation

## 2018-01-21 DIAGNOSIS — I5042 Chronic combined systolic (congestive) and diastolic (congestive) heart failure: Secondary | ICD-10-CM | POA: Insufficient documentation

## 2018-01-21 DIAGNOSIS — Z7982 Long term (current) use of aspirin: Secondary | ICD-10-CM | POA: Diagnosis not present

## 2018-01-21 DIAGNOSIS — Z91010 Allergy to peanuts: Secondary | ICD-10-CM | POA: Insufficient documentation

## 2018-01-21 DIAGNOSIS — R002 Palpitations: Secondary | ICD-10-CM | POA: Diagnosis present

## 2018-01-21 DIAGNOSIS — I499 Cardiac arrhythmia, unspecified: Secondary | ICD-10-CM | POA: Diagnosis not present

## 2018-01-21 LAB — CBC
HCT: 38.7 % (ref 35.0–47.0)
Hemoglobin: 13.3 g/dL (ref 12.0–16.0)
MCH: 32.5 pg (ref 26.0–34.0)
MCHC: 34.4 g/dL (ref 32.0–36.0)
MCV: 94.6 fL (ref 80.0–100.0)
Platelets: 259 10*3/uL (ref 150–440)
RBC: 4.09 MIL/uL (ref 3.80–5.20)
RDW: 13.5 % (ref 11.5–14.5)
WBC: 5.6 10*3/uL (ref 3.6–11.0)

## 2018-01-21 LAB — BASIC METABOLIC PANEL
Anion gap: 6 (ref 5–15)
BUN: 17 mg/dL (ref 6–20)
CO2: 28 mmol/L (ref 22–32)
Calcium: 9 mg/dL (ref 8.9–10.3)
Chloride: 104 mmol/L (ref 101–111)
Creatinine, Ser: 0.98 mg/dL (ref 0.44–1.00)
GFR calc Af Amer: >60 mL/min (ref >60)
GFR calc non Af Amer: >60 mL/min (ref >60)
Glucose, Bld: 105 mg/dL — ABNORMAL HIGH (ref 65–99)
Potassium: 3.7 mmol/L (ref 3.5–5.1)
Sodium: 138 mmol/L (ref 135–145)

## 2018-01-21 LAB — TROPONIN I
Troponin I: <0.03 ng/mL (ref <0.03)
Troponin I: <0.03 ng/mL (ref <0.03)

## 2018-01-21 NOTE — ED Triage Notes (Signed)
Pt to triage via [facility], report irregular heart rate at home, reports hx of same but not for a long time.  Pt reports in past she had afib with rapid heart rate, reports today HR was slow, as low as the 40s.  Irregular sinus rhythm on EKG in triage

## 2018-01-21 NOTE — ED Provider Notes (Signed)
[facility] Emergency Department Provider Note  ____________________________________________   First MD Initiated Contact with Patient 01/21/18 2358     (approximate)  I have reviewed the triage vital signs and the nursing notes.   HISTORY  Chief Complaint Irregular Heart Beat   HPI Rachel Torres is a 62 y.o. female who self presents the emergency department with palpitations for the past 24 hours or so.  Her symptoms began insidiously and have been constant.  Nothing seems to make it better or worse.  She has a complex past medical history including AAA, aortic dissection, aortic valve repair, coronary artery disease.  Currently takes aspirin every day and no other blood thinning medication.  She most recently saw her cardiologist 2 days ago at which point she had an echocardiogram which was reportedly "normal".  Past Medical History:  Diagnosis Date  . AAA (abdominal aortic aneurysm) ([place])    a. Chronic w/o evidence of aneurysmal dil on CTA 01/2016.  Marland Kitchen Allergy   . Anemia   . Anxiety   . Asthma   . CAD (coronary artery disease)    a. 08/2010 s/p CABG x 1 (VG->RCA) @ time of Ao dissection repair; b. 01/2016 Lexiscan MV: mid antsept/apical defect w/ ? peri-infarct ischemia-->likely attenuation-->Med Rx.  . Chronic combined systolic (congestive) and diastolic (congestive) heart failure ([place])    a. 2012 EF 30-35%; b. 04/2015 EF 40-45%; c. 01/2016 Echo: Ef 55-65%, nl AoV bioprosthesis, nl RV, nl PASP.  Marland Kitchen Depression   . Gallbladder sludge   . GERD (gastroesophageal reflux disease)   . History of Bicuspid Aortic Valve    a. 08/2010 s/p AVR @ time of Ao dissection repair; b. 01/2016 Echo: Ef 55-65%, nl AoV bioprosthesis, nl RV, nl PASP.  Marland Kitchen Hx of repair of dissecting thoracic aortic aneurysm, [place] type A    a. 08/2010 s/p repair with AVR and VG->RCA; b. 01/2016 CTA: stable appearance of Asc Thoracic Aortic graft. Opacification of flase lumen of chronic abd Ao  dissection w/ retrograde flow through lumbar arteries. No aneurysmal dil of flase lumen.  . Hyperlipidemia   . Hypertensive heart disease   . IBS (irritable bowel syndrome)   . Internal hemorrhoids   . Marfan syndrome    pt denies  . Migraine   . Obstructive sleep apnea   . Osteoporosis   . Pneumonia   . RA (rheumatoid arthritis) ([place])    a. Followed by Dr. [name]    Patient Active Problem List   Diagnosis Date Noted  . Cough 09/08/2017  . Type 1 dissection of ascending aorta ([place]) 04/23/2017  . HPV (human papilloma virus) infection 12/01/2016  . Screening mammogram, encounter for 11/29/2016  . Estrogen deficiency 11/29/2016  . Encounter for routine gynecological examination 11/29/2016  . Grade III hemorrhoids 10/06/2016  . Tachycardia 08/14/2016  . Hemorrhoids 08/13/2016  . Atypical migraine 08/03/2016  . CAD (coronary artery disease)   . AAA (abdominal aortic aneurysm) ([place])   . Chronic combined systolic (congestive) and diastolic (congestive) heart failure ([place])   . Hyperglycemia 02/11/2016  . Generalized anxiety disorder 12/08/2015  . Panic attacks 07/28/2015  . Sensorineural hearing loss 03/27/2013  . Vitamin D deficiency 07/03/2012  . H/O aortic dissection 05/12/2012  . Obesity (BMI 30.0-34.9) 11/21/2011  . Routine general medical examination at a health care facility 09/20/2011  . S/P AVR (aortic valve replacement) 01/07/2011  . CORONARY ARTERY BYPASS GRAFT, HX OF 01/07/2011  . Arthritis 12/20/2010  . H/O dissecting abdominal  aortic aneurysm repair 08/24/2010  . IRRITABLE BOWEL SYNDROME 09/09/2009  . Obstructive sleep apnea 08/04/2009  . External hemorrhoid 06/26/2009  . Osteoporosis 01/09/2009  . Hyperlipidemia 07/26/2007  . Depression with anxiety 07/26/2007  . Essential hypertension 07/26/2007  . Allergic rhinitis 07/26/2007  . Asthma 07/26/2007  . GERD 07/26/2007  . Osteoarthritis 07/26/2007  . Ocular migraine 07/26/2007  . Mild intermittent asthma  without complication 26/94/8546    Past Surgical History:  Procedure Laterality Date  . ABDOMINAL AORTIC ANEURYSM REPAIR    . CARDIAC CATHETERIZATION  2001  . [place]  . CHOLECYSTECTOMY    . COLONOSCOPY    . FRACTURE SURGERY Right    shoulder replacement  . HEMORRHOID BANDING    . ORIF DISTAL RADIUS FRACTURE Left   . UPPER GASTROINTESTINAL ENDOSCOPY      Prior to Admission medications   Medication Sig Start Date End Date Taking? Authorizing Provider  albuterol (PROVENTIL HFA;VENTOLIN HFA) 108 (90 Base) MCG/ACT inhaler Inhale 1-2 puffs into the lungs every 6 (six) hours as needed for wheezing or shortness of breath. 09/04/17   Norval Gable, MD  ALPRAZolam Duanne Moron) 0.5 MG tablet Take 1 tablet (0.5 mg total) by mouth daily as needed for anxiety. 06/28/17   Tower, Wynelle Fanny, MD  aspirin EC 81 MG tablet Take 81 mg by mouth at bedtime.    [provider]  atorvastatin (LIPITOR) 20 MG tablet TAKE 1 TABLET EVERY DAY 12/15/17   Gollan, Kathlene November, MD  benzonatate (TESSALON) 200 MG capsule Take 1 capsule (200 mg total) by mouth 3 (three) times daily as needed. 09/04/17   Norval Gable, MD  carvedilol (COREG) 6.25 MG tablet TAKE 1 TABLET (6.25 MG) BY MOUTH 2 (TWO) TIMES DAILY WITH MEALS 12/15/17   Minna Merritts, MD  cholecalciferol (VITAMIN D) 1000 units tablet Take 4,000 Units by mouth daily.     [provider]  cyclobenzaprine (FLEXERIL) 10 MG tablet Take 0.5-1 tablets (5-10 mg total) by mouth 3 (three) times daily as needed. For headache 12/27/17   Tower, Wynelle Fanny, MD  furosemide (LASIX) 20 MG tablet TAKE 1 TABLET (20 MG TOTAL) BY MOUTH DAILY. 07/27/17 10/20/21  Minna Merritts, MD  hydrocortisone 2.5 % cream Apply topically 2 (two) times daily. For maximum 7 days. 04/15/17   Elby Beck, FNP  Javier Docker Oil (OMEGA-3) 500 MG CAPS Take 1 capsule by mouth daily.    [provider]  lisinopril (PRINIVIL,ZESTRIL) 5 MG tablet TAKE 1/2 TABLET (2.5 MG  TOTAL) BY MOUTH DAILY. 04/26/17   Minna Merritts, MD  magnesium gluconate (MAGONATE) 500 MG tablet Take 500 mg by mouth at bedtime.    [provider]  Melatonin 3 MG CAPS Take 1 capsule by mouth at bedtime as needed.    [provider]  Misc Natural Products (TART CHERRY ADVANCED) CAPS Take 1 capsule by mouth daily.    [provider]  naproxen sodium (ANAPROX) 220 MG tablet Take 440 mg by mouth 2 (two) times daily as needed (for headaches/pain).    [provider]  nitroGLYCERIN (NITROSTAT) 0.4 MG SL tablet Place 1 tablet (0.4 mg total) under the tongue every 5 (five) minutes as needed for chest pain. 04/26/17   Minna Merritts, MD  omeprazole (PRILOSEC) 40 MG capsule Take 1 capsule (40 mg total) by mouth daily. 12/27/17   Tower, Wynelle Fanny, MD  potassium chloride (K-DUR,KLOR-CON) 10 MEQ tablet TAKE 1 TABLET (10 MEQ) BY MOUTH  2 (TWO) TIMES DAILY AS NEEDED. 12/15/17   Minna Merritts, MD  promethazine (PHENERGAN) 25 MG tablet Take 0.5 tablets (12.5 mg total) by mouth every 8 (eight) hours as needed for nausea. 06/28/17   Tower, Wynelle Fanny, MD  venlafaxine XR (EFFEXOR-XR) 150 MG 24 hr capsule Take 1 capsule (150 mg total) by mouth daily with breakfast. 06/28/17   Tower, Wynelle Fanny, MD  warfarin (COUMADIN) 3 MG tablet Take 1 tablet (3 mg total) by mouth daily. 01/22/18 01/22/19  Darel Hong, MD    Allergies Fosamax [alendronate sodium]; Ginzing [ginseng]; Hydrocod polst-cpm polst er; Hydrocodone-acetaminophen; Imitrex [sumatriptan]; Oxycodone; Peanut-containing drug products; Tramadol; Hydrocodone-guaifenesin; Prednisone; and Tape  Family History  Problem Relation Age of Onset  . Hypertension Mother   . Depression Mother   . Osteoporosis Mother   . Stroke Mother   . Irritable bowel syndrome Mother   . Asthma Mother   . Arthritis Sister   . Diabetes Sister   . Heart disease Sister   . Asthma Sister   . Migraines Sister   . Nephrolithiasis Sister   . Osteoporosis  Sister   . Arthritis Sister   . Asthma Sister   . Migraines Sister   . Other Father 19  . Stroke Maternal Grandmother   . Colon cancer Maternal Grandmother   . Lymphoma Maternal Grandmother   . Migraines Maternal Grandmother   . Aneurysm Paternal Grandmother        brain  . Diabetes Paternal Grandmother   . Arthritis Paternal Grandmother   . Arthritis Paternal Grandfather   . Diabetes Paternal Grandfather   . Other Brother        prediabeties  . Asthma Brother   . Migraines Brother   . Lung cancer Maternal Grandfather   . Melanoma Maternal Grandfather     Social History Social History   Tobacco Use  . Smoking status: Former Smoker    Last attempt to quit: 12/20/1978    Years since quitting: 39.1  . Smokeless tobacco: Never Used  Substance Use Topics  . Alcohol use: No    Alcohol/week: 0.0 oz  . Drug use: No    Review of Systems Constitutional: No fever/chills Eyes: No visual changes. ENT: No sore throat. Cardiovascular: Denies chest pain. Respiratory: Positive for shortness of breath. Gastrointestinal: No abdominal pain.  No nausea, no vomiting.  No diarrhea.  No constipation. Genitourinary: Negative for dysuria. Musculoskeletal: Negative for back pain. Skin: Negative for rash. Neurological: Negative for headaches, focal weakness or numbness.   ____________________________________________   PHYSICAL EXAM:  VITAL SIGNS: ED Triage Vitals  Enc Vitals Group     BP 01/21/18 1842 128/88     Pulse Rate 01/21/18 1842 63     Resp 01/21/18 1842 18     Temp 01/21/18 1842 98.2 F (36.8 C)     Temp Source 01/21/18 1842 Oral     SpO2 01/21/18 1842 97 %     Weight 01/21/18 1843 204 lb (92.5 kg)     Height 01/21/18 1843 5\' 5"  (1.651 m)     Head Circumference --      Peak Flow --      Pain Score --      Pain Loc --      Pain Edu? --      Excl. in [place]? --     Constitutional: Alert and oriented x4 pleasant cooperative speaks in full clear sentences no  diaphoresis Eyes: PERRL EOMI. Head: Atraumatic. Nose: No congestion/rhinnorhea. Mouth/Throat:  No trismus Neck: No stridor.   Cardiovascular: Irregularly irregular heart rhythm although with normal rate Respiratory: Normal respiratory effort.  No retractions. Lungs CTAB and moving good air Gastrointestinal: Soft nontender Musculoskeletal: No lower extremity edema   Neurologic:  Normal speech and language. No gross focal neurologic deficits are appreciated. Skin:  Skin is warm, dry and intact. No rash noted. Psychiatric: Mood and affect are normal. Speech and behavior are normal.    ____________________________________________   DIFFERENTIAL includes but not limited to  Atrial fibrillation, dehydration, atrial flutter, ventricular tachycardia, SVT, thyrotoxicosis ____________________________________________   LABS (all labs ordered are listed, but only abnormal results are displayed)  Labs Reviewed  BASIC METABOLIC PANEL - Abnormal; Notable for the following components:      Result Value   Glucose, Bld 105 (*)    All other components within normal limits  CBC  TROPONIN I  TROPONIN I    Lab work reviewed by me with no signs of acute ischemia x2 __________________________________________  EKG  ED ECG REPORT I, Darel Hong, the attending physician, personally viewed and interpreted this ECG.  Date: 01/21/2018 EKG Time: 1840 Rate: 85 Rhythm: Atrial fibrillation QRS Axis: Leftward axis Intervals: normal ST/T Wave abnormalities: normal Narrative Interpretation: no evidence of acute ischemia  ED ECG REPORT I, Darel Hong, the attending physician, personally viewed and interpreted this ECG.  Date: 01/21/2018 EKG Time: 2149 Rate: 79 Rhythm: Atrial fibrillation QRS Axis: Leftward axis Intervals: normal ST/T Wave abnormalities: normal Narrative Interpretation: no evidence of acute ischemia  ____________________________________________  RADIOLOGY  Chest  x-ray reviewed by me with no acute disease ________________________________________   PROCEDURES  Procedure(s) performed: no  Procedures  Critical Care performed: no  Observation: no ____________________________________________   INITIAL IMPRESSION / ASSESSMENT AND PLAN / ED COURSE  Pertinent labs & imaging results that were available during my care of the patient were reviewed by me and considered in my medical decision making (see chart for details).  The patient's 2 EKGs show rate controlled atrial fibrillation, however when I walk into the room she is normal sinus on the monitor.  She has never been in atrial fibrillation before.  I have a call out to her cardiologist to discuss but I do anticipate outpatient management.     ----------------------------------------- 12:39 AM on 01/22/2018 -----------------------------------------  I discussed the case with Dr. Lovena Le on-call for the patient's cardiologist Dr. [name] who recommends initiation of anticoagulation with warfarin 3 mg a day and follow-up in clinic this week.  He recommends against bridging with Lovenox.  The patient expressed hesitance and does want the prescription but she is not sure whether or not she will fill it which I think is reasonable.  Strict return precautions have been given and the patient verbalizes understanding ____________________________________________   FINAL CLINICAL IMPRESSION(S) / ED DIAGNOSES  Final diagnoses:  Atrial fibrillation, unspecified type ([place])      NEW MEDICATIONS STARTED DURING THIS VISIT:  New Prescriptions   WARFARIN (COUMADIN) 3 MG TABLET    Take 1 tablet (3 mg total) by mouth daily.     Note:  This document was prepared using Dragon voice recognition software and may include unintentional dictation errors.     Darel Hong, MD 01/22/18 [phone]

## 2018-01-21 NOTE — ED Notes (Signed)
Pt waiting patiently in waiting room for treatment room but concerned about her heart beating in a wrong rhythm; called Dr Jimmye Norman and orders given to repeat troponin and EKG

## 2018-01-22 MED ORDER — WARFARIN SODIUM 3 MG PO TABS: 3.0000 mg | ORAL_TABLET | Freq: Every day | ORAL | 0 refills | Status: DC

## 2018-01-22 NOTE — Discharge Instructions (Signed)
Please begin taking your warfarin at the same time every night and follow-up with Dr. [name] within 2 days for reevaluation.  Return to the emergency department sooner for any concerns.  It was a pleasure to take care of you today, and thank you for coming to our emergency department.  If you have any questions or concerns before leaving please ask the nurse to grab me and I'm more than happy to go through your aftercare instructions again.  If you were prescribed any opioid pain medication today such as Norco, Vicodin, Percocet, morphine, hydrocodone, or oxycodone please make sure you do not drive when you are taking this medication as it can alter your ability to drive safely.  If you have any concerns once you are home that you are not improving or are in fact getting worse before you can make it to your follow-up appointment, please do not hesitate to call 911 and come back for further evaluation.  Darel Hong, MD  Results for orders placed or performed during the hospital encounter of 16/10/96  Basic metabolic panel  Result Value Ref Range   Sodium 138 135 - 145 mmol/L   Potassium 3.7 3.5 - 5.1 mmol/L   Chloride 104 101 - 111 mmol/L   CO2 28 22 - 32 mmol/L   Glucose, Bld 105 (H) 65 - 99 mg/dL   BUN 17 6 - 20 mg/dL   Creatinine, Ser 0.98 0.44 - 1.00 mg/dL   Calcium 9.0 8.9 - 10.3 mg/dL   GFR calc non Af Amer >60 >60 mL/min   GFR calc Af Amer >60 >60 mL/min   Anion gap 6 5 - 15  CBC  Result Value Ref Range   WBC 5.6 3.6 - 11.0 K/uL   RBC 4.09 3.80 - 5.20 MIL/uL   Hemoglobin 13.3 12.0 - 16.0 g/dL   HCT 38.7 35.0 - 47.0 %   MCV 94.6 80.0 - 100.0 fL   MCH 32.5 26.0 - 34.0 pg   MCHC 34.4 32.0 - 36.0 g/dL   RDW 13.5 11.5 - 14.5 %   Platelets 259 150 - 440 K/uL  Troponin I  Result Value Ref Range   Troponin I <0.03 <0.03 ng/mL  Troponin I  Result Value Ref Range   Troponin I <0.03 <0.03 ng/mL   Dg Chest 2 View  Result Date: 01/21/2018 CLINICAL DATA:  Irregular heart rhythm.  EXAM: CHEST  2 VIEW COMPARISON:  Radiographs of September 07, 2017. FINDINGS: The heart size and mediastinal contours are within normal limits. Both lungs are clear. No pneumothorax or pleural effusion is noted. Sternotomy wires are noted. The visualized skeletal structures are unremarkable. IMPRESSION: No active cardiopulmonary disease. Electronically Signed   By: Marijo Conception, M.D.   On: 01/21/2018 19:59

## 2018-01-23 ENCOUNTER — Ambulatory Visit (INDEPENDENT_AMBULATORY_CARE_PROVIDER_SITE_OTHER): Payer: Medicare HMO | Admitting: Psychology

## 2018-01-23 DIAGNOSIS — F4323 Adjustment disorder with mixed anxiety and depressed mood: Secondary | ICD-10-CM

## 2018-01-23 MED ORDER — CARVEDILOL 6.25 MG PO TABS: 6.2500 mg | ORAL_TABLET | Freq: Two times a day (BID) | ORAL | 3 refills | Status: DC

## 2018-01-23 MED ORDER — ATORVASTATIN CALCIUM 20 MG PO TABS: 20.0000 mg | ORAL_TABLET | Freq: Every day | ORAL | 0 refills | Status: DC

## 2018-01-23 MED ORDER — POTASSIUM CHLORIDE CRYS ER 10 MEQ PO TBCR: 10.0000 meq | EXTENDED_RELEASE_TABLET | Freq: Two times a day (BID) | ORAL | 3 refills | Status: DC | PRN

## 2018-01-24 ENCOUNTER — Encounter: Payer: Self-pay | Admitting: Cardiovascular Disease

## 2018-01-24 ENCOUNTER — Ambulatory Visit (INDEPENDENT_AMBULATORY_CARE_PROVIDER_SITE_OTHER): Payer: Medicare HMO | Admitting: Cardiovascular Disease

## 2018-01-24 VITALS — BP 128/74 | HR 76 | Ht 65.0 in | Wt 206.5 lb

## 2018-01-24 DIAGNOSIS — I480 Paroxysmal atrial fibrillation: Secondary | ICD-10-CM

## 2018-01-24 NOTE — Progress Notes (Signed)
Cardiology Office Note  Date:  01/24/2018   ID:  Rachel Torres, DOB 01/26/1956, MRN [id number]  PCP:  [name], MD   Chief Complaint  Patient presents with  . Other    ED Follow up for AFIB. Meds reviewed verbally with patient.     HPI:  Ms. 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 and underwent aortic valve replacement,  bypass of the right coronary artery with venous donor site from her right thigh,  aortic grafting for a extensive dissection of the  ascending aorta, residual descending aortic dissection extending into the iliac artery,  chronic back pain, previous symptoms of shortness of breath,  abdominal pain,  hypertension  She reports pathology of her aortic valve showed bicuspid valve Chest pain episodes resolved by GI cocktail She presents today for follow-up of her aortic valve replacement and CABG.  Recently in the emergency room for palpitations, seen January 21, 2018  Hospital records reviewed with the patient in detail Told that she had atrial fibrillation based off EKG x2 Started on warfarin She did not start the medication Reports that symptoms went away without intervention  ----Review of EKG from the emergency room January 21, 2018 shows normal sinus rhythm with APCs.  No evidence of atrial fibrillation   Denies having any further episodes of palpitations No chest pain symptoms Recent echocardiogram normal with normal ejection fraction no significant valve disease Unable to visualize aorta  She reports having significant stress at home, takes care of her husband who has Parkinson's Eating "bad" Total chol 132 up to 190s  EKG personally reviewed by myself on todays visit Normal sinus rhythm rate 76 bpm no significant ST or T wave changes  Other past medical history reviewed Previous episode of Chest pain December 11, 2017, Was driving, pulled over, called  EMT Resolved 30 min later, 8/10 in intensity EKG was ok Typically takes GI cocktail when she has the symptoms.   Reports that she had significant stress with dinner party planned the next day.   takes GI cocktail for severe symptoms Once a month or so has severe reflux symptoms   seen by vascular surgery , Dr. Donnetta Hutching, seen 01/2017  recommendation was for f/u in 2 years with CT scan of her chest abdomen and pelvis.  Echo 01/2016, normal EF >55%  no regular exercise program  Previous  30 day monitor ordered for palpitations,  APCs and PVCs  Other past medical history reviewed Echocardiogram May 2016 shows slightly improved cardiac function, ejection fraction 40-45%.   diagnosed with obstructive sleep apnea. She has a CPAP machine but she is not using it.  Previous evaluations in the emergency room for chest pain. Cardiac enzymes were negative , EKG unchanged .She had a CT scan dated 06/20/2013. She was transferred to [facility] given her complicated anatomy. images were evaluated by vascular surgery it was determined there have been no significant progression of her disease  Previous evaluation at [facility] for shortness of breath and chest discomfort. She had workup for cardiac issues including echocardiogram and PET stress, CTA of the chest showing stable descending aorta repair with type B. aortic dissection with opacification of the false lumen at the level of the renal arteries via a lumbar artery.  Echocardiogram showed ejection fraction had improved to 45-50%, mild MR, TR (ejection fraction improved from 30-35% in January 2012)  Previous H/o abdominal pain, further  workup showing biliary stones and sludge. s/p  laparoscopic cholecystectomy at [facility].  She takes Lasix for urinary retention.   PMH:   has a past medical history of AAA (abdominal aortic aneurysm) ([place]), Allergy, Anemia, Anxiety, Asthma, CAD (coronary artery disease), Chronic combined systolic (congestive) and diastolic  (congestive) heart failure ([place]), Depression, Gallbladder sludge, GERD (gastroesophageal reflux disease), History of Bicuspid Aortic Valve, repair of dissecting thoracic aortic aneurysm, [place] type A, Hyperlipidemia, Hypertensive heart disease, IBS (irritable bowel syndrome), Internal hemorrhoids, Marfan syndrome, Migraine, Obstructive sleep apnea, Osteoporosis, Pneumonia, and RA (rheumatoid arthritis) ([place]).  PSH:    Past Surgical History:  Procedure Laterality Date  . ABDOMINAL AORTIC ANEURYSM REPAIR    . CARDIAC CATHETERIZATION  2001  . [place]  . CHOLECYSTECTOMY    . COLONOSCOPY    . FRACTURE SURGERY Right    shoulder replacement  . HEMORRHOID BANDING    . ORIF DISTAL RADIUS FRACTURE Left   . UPPER GASTROINTESTINAL ENDOSCOPY      Current Outpatient Medications  Medication Sig Dispense Refill  . albuterol (PROVENTIL HFA;VENTOLIN HFA) 108 (90 Base) MCG/ACT inhaler Inhale 1-2 puffs into the lungs every 6 (six) hours as needed for wheezing or shortness of breath. 1 Inhaler 0  . ALPRAZolam (XANAX) 0.5 MG tablet Take 1 tablet (0.5 mg total) by mouth daily as needed for anxiety. 30 tablet 0  . aspirin EC 81 MG tablet Take 81 mg by mouth at bedtime.    Marland Kitchen atorvastatin (LIPITOR) 20 MG tablet Take 1 tablet (20 mg total) by mouth daily. 90 tablet 0  . benzonatate (TESSALON) 200 MG capsule Take 1 capsule (200 mg total) by mouth 3 (three) times daily as needed. 30 capsule 0  . carvedilol (COREG) 6.25 MG tablet Take 1 tablet (6.25 mg total) by mouth 2 (two) times daily with a meal. 180 tablet 3  . cholecalciferol (VITAMIN D) 1000 units tablet Take 4,000 Units by mouth daily.     . cyclobenzaprine (FLEXERIL) 10 MG tablet Take 0.5-1 tablets (5-10 mg total) by mouth 3 (three) times daily as needed. For headache 90 tablet 3  . furosemide (LASIX) 20 MG tablet TAKE 1 TABLET (20 MG TOTAL) BY MOUTH DAILY. 90 tablet 1  . hydrocortisone 2.5 % cream Apply topically 2 (two) times daily.  For maximum 7 days. 28 g 0  . Krill Oil (OMEGA-3) 500 MG CAPS Take 1 capsule by mouth daily.    Marland Kitchen lisinopril (PRINIVIL,ZESTRIL) 5 MG tablet TAKE 1/2 TABLET (2.5 MG TOTAL) BY MOUTH DAILY. 45 tablet 3  . magnesium gluconate (MAGONATE) 500 MG tablet Take 500 mg by mouth at bedtime.    . Melatonin 3 MG CAPS Take 1 capsule by mouth at bedtime as needed.    . Misc Natural Products (TART CHERRY ADVANCED) CAPS Take 1 capsule by mouth daily.    . naproxen sodium (ANAPROX) 220 MG tablet Take 440 mg by mouth 2 (two) times daily as needed (for headaches/pain).    . nitroGLYCERIN (NITROSTAT) 0.4 MG SL tablet Place 1 tablet (0.4 mg total) under the tongue every 5 (five) minutes as needed for chest pain. 25 tablet 3  . omeprazole (PRILOSEC) 40 MG capsule Take 1 capsule (40 mg total) by mouth daily. 90 capsule 3  . potassium chloride (K-DUR,KLOR-CON) 10 MEQ tablet Take 1 tablet (10 mEq total) by mouth 2 (two) times daily as needed. 180 tablet 3  . promethazine (PHENERGAN) 25 MG tablet Take 0.5 tablets (12.5 mg total)  by mouth every 8 (eight) hours as needed for nausea. 30 tablet 5  . venlafaxine XR (EFFEXOR-XR) 150 MG 24 hr capsule Take 1 capsule (150 mg total) by mouth daily with breakfast. 90 capsule 3  . warfarin (COUMADIN) 3 MG tablet Take 1 tablet (3 mg total) by mouth daily. (Patient not taking: Reported on 01/24/2018) 30 tablet 0   No current facility-administered medications for this visit.      Allergies:   Fosamax [alendronate sodium]; Ginzing [ginseng]; Hydrocod polst-cpm polst er; Hydrocodone-acetaminophen; Imitrex [sumatriptan]; Oxycodone; Peanut-containing drug products; Tramadol; Hydrocodone-guaifenesin; Prednisone; and Tape   Social History:  The patient  reports that she quit smoking about 39 years ago. she has never used smokeless tobacco. She reports that she does not drink alcohol or use drugs.   Family History:   family history includes Aneurysm in her paternal grandmother; Arthritis in her  paternal grandfather, paternal grandmother, sister, and sister; Asthma in her brother, mother, sister, and sister; Colon cancer in her maternal grandmother; Depression in her mother; Diabetes in her paternal grandfather, paternal grandmother, and sister; Heart disease in her sister; Hypertension in her mother; Irritable bowel syndrome in her mother; Lung cancer in her maternal grandfather; Lymphoma in her maternal grandmother; Melanoma in her maternal grandfather; Migraines in her brother, maternal grandmother, sister, and sister; Nephrolithiasis in her sister; Osteoporosis in her mother and sister; Other in her brother; Other (age of onset: 45) in her father; Stroke in her maternal grandmother and mother.    Review of Systems: Review of Systems  Constitutional: Negative.   Respiratory: Negative.   Cardiovascular: Positive for palpitations.  Gastrointestinal: Negative.   Musculoskeletal: Negative.   Neurological: Negative.   Psychiatric/Behavioral: Negative.   All other systems reviewed and are negative.    PHYSICAL EXAM: VS:  BP 128/74 (BP Location: Left Arm, Patient Position: Sitting, Cuff Size: Normal)   Pulse 76   Ht 5\' 5"  (1.651 m)   Wt 206 lb 8 oz (93.7 kg)   BMI 34.36 kg/m  , BMI Body mass index is 34.36 kg/m. GEN: Well nourished, well developed, in no acute distress  HEENT: normal  Neck: no JVD, carotid bruits, or masses Cardiac: RRR; no murmurs, rubs, or gallops,no edema  Respiratory:  clear to auscultation bilaterally, normal work of breathing GI: soft, nontender, nondistended, + BS MS: no deformity or atrophy  Skin: warm and dry, no rash Neuro:  Strength and sensation are intact Psych: euthymic mood, full affect    Recent Labs: 11/24/2017: ALT 14; TSH 1.30 01/21/2018: BUN 17; Creatinine, Ser 0.98; Hemoglobin 13.3; Platelets 259; Potassium 3.7; Sodium 138    Lipid Panel Lab Results  Component Value Date   CHOL 193 11/24/2017   HDL 63.20 11/24/2017   LDLCALC 114  (H) 11/24/2017   TRIG 78.0 11/24/2017      Wt Readings from Last 3 Encounters:  01/24/18 206 lb 8 oz (93.7 kg)  01/21/18 204 lb (92.5 kg)  01/11/18 204 lb 12 oz (92.9 kg)       ASSESSMENT AND PLAN:  Pure hypercholesterolemia She reports dietary noncompliance Recommend she stay on her current cholesterol medication, change diet  Essential hypertension - Plan: EKG 12-Lead Blood pressure is well controlled on today's visit. No changes made to the medications. Stable.   Palpitations, APCs Rare episodes, Not atrial fibrillation.  She does not need warfarin Recommended she take extra carvedilol as needed  CORONARY ARTERY BYPASS GRAFT, HX OF - Plan: EKG 12-Lead Currently with no symptoms of angina.  No further workup at this time. Continue current medication regimen.  Recent normal echocardiogram No further episodes of chest pain on exertion  Dissection of thoracoabdominal aorta (HCC) Followed by vascular surgeryin [place], Dr. Donnetta Hutching   Repeat scan scheduled for 2020  If she has recurrent chest pain symptoms we would order MRI Stable, no further episodes of pain.  Unable to visualize aorta with recent echocardiogram  Coronary artery disease involving native coronary artery of native heart without angina pectoris - Plan: EKG 12-Lead As above, but no further chest pain, stable symptoms  H/O dissecting abdominal aortic aneurysm repair - Plan: EKG 12-Lead  MRI 2020     Total encounter time more than 25 minutes  Greater than 50% was spent in counseling and coordination of care with the patient   Disposition:   F/U  6 months   Orders Placed This Encounter  Procedures  . EKG 12-Lead     Signed, Esmond Plants, M.D., Ph.D. 01/24/2018  [place], [place]

## 2018-01-24 NOTE — Patient Instructions (Signed)

## 2018-01-25 ENCOUNTER — Ambulatory Visit (INDEPENDENT_AMBULATORY_CARE_PROVIDER_SITE_OTHER): Payer: Medicare HMO | Admitting: Internal Medicine

## 2018-01-25 ENCOUNTER — Encounter: Payer: Self-pay | Admitting: Internal Medicine

## 2018-01-25 VITALS — BP 124/64 | HR 79 | Ht 65.5 in | Wt 206.0 lb

## 2018-01-25 DIAGNOSIS — K641 Second degree hemorrhoids: Secondary | ICD-10-CM

## 2018-01-25 NOTE — Progress Notes (Signed)
   Hemorrhoid banding  sxs bleeding and some fecal leakage Better but not resolved after prior banding 04/2017   Rachel Torres, [place] present.  PROCEDURE NOTE: The patient presents with symptomatic grade 2  hemorrhoids, requesting rubber band ligation of his/her hemorrhoidal disease.  All risks, benefits and alternative forms of therapy were described and informed consent was obtained.   The anorectum was pre-medicated with 0.125% NTG and 5% lidocaine topical The decision was made to band the RP and LL internal hemorrhoids, and the [place] was used to perform band ligation without complication.  Digital anorectal examination was then performed to assure proper positioning of the band, and to adjust the banded tissue as required.  The patient was discharged home without pain or other issues.  Dietary and behavioral recommendations were given and along with follow-up instructions.     The following adjunctive treatments were recommended:  Benefiber  The patient will return as needed for  follow-up and possible additional banding as required. No complications were encountered and the patient tolerated the procedure well.

## 2018-01-25 NOTE — Patient Instructions (Signed)
HEMORRHOID BANDING PROCEDURE    FOLLOW-UP CARE   1. The procedure you have had should have been relatively painless since the banding of the area involved does not have nerve endings and there is no pain sensation.  The rubber band cuts off the blood supply to the hemorrhoid and the band may fall off as soon as 48 hours after the banding (the band may occasionally be seen in the toilet bowl following a bowel movement). You may notice a temporary feeling of fullness in the rectum which should respond adequately to plain Tylenol or Motrin.  2. Following the banding, avoid strenuous exercise that evening and resume full activity the next day.  A sitz bath (soaking in a warm tub) or bidet is soothing, and can be useful for cleansing the area after bowel movements.     3. To avoid constipation, take two tablespoons of natural wheat bran, natural oat bran, flax, Benefiber or any over the counter fiber supplement and increase your water intake to 7-8 glasses daily.    4. Unless you have been prescribed anorectal medication, do not put anything inside your rectum for two weeks: No suppositories, enemas, fingers, etc.  5. Occasionally, you may have more bleeding than usual after the banding procedure.  This is often from the untreated hemorrhoids rather than the treated one.  Don't be concerned if there is a tablespoon or so of blood.  If there is more blood than this, lie flat with your bottom higher than your head and apply an ice pack to the area. If the bleeding does not stop within a half an hour or if you feel faint, call our office at [phone] or go to the emergency room.  6. Problems are not common; however, if there is a substantial amount of bleeding, severe pain, chills, fever or difficulty passing urine (very rare) or other problems, you should call us at [phone] or report to the nearest emergency room.  7. Do not stay seated continuously for more than 2-3 hours for a day or two  after the procedure.  Tighten your buttock muscles 10-15 times every two hours and take 10-15 deep breaths every 1-2 hours.  Do not spend more than a few minutes on the toilet if you cannot empty your bowel; instead re-visit the toilet at a later time.    Follow up with Dr Gessner as needed.    I appreciate the opportunity to care for you. Carl Gessner, MD, FACG 

## 2018-01-26 DIAGNOSIS — M5412 Radiculopathy, cervical region: Secondary | ICD-10-CM | POA: Diagnosis not present

## 2018-01-30 ENCOUNTER — Ambulatory Visit: Payer: Medicare HMO | Admitting: Psychology

## 2018-02-06 ENCOUNTER — Ambulatory Visit: Payer: Medicare HMO | Admitting: Psychology

## 2018-02-09 ENCOUNTER — Encounter: Payer: Self-pay | Admitting: Family Medicine

## 2018-02-20 ENCOUNTER — Other Ambulatory Visit: Payer: Self-pay

## 2018-02-20 ENCOUNTER — Telehealth: Payer: Self-pay | Admitting: Cardiovascular Disease

## 2018-02-20 ENCOUNTER — Ambulatory Visit: Payer: Medicare HMO | Attending: Neurosurgery

## 2018-02-20 DIAGNOSIS — R293 Abnormal posture: Secondary | ICD-10-CM | POA: Diagnosis not present

## 2018-02-20 DIAGNOSIS — I429 Cardiomyopathy, unspecified: Secondary | ICD-10-CM | POA: Diagnosis not present

## 2018-02-20 DIAGNOSIS — E669 Obesity, unspecified: Secondary | ICD-10-CM | POA: Diagnosis not present

## 2018-02-20 DIAGNOSIS — S92514A Nondisplaced fracture of proximal phalanx of right lesser toe(s), initial encounter for closed fracture: Secondary | ICD-10-CM | POA: Diagnosis not present

## 2018-02-20 DIAGNOSIS — I2510 Atherosclerotic heart disease of native coronary artery without angina pectoris: Secondary | ICD-10-CM | POA: Diagnosis not present

## 2018-02-20 DIAGNOSIS — S92504A Nondisplaced unspecified fracture of right lesser toe(s), initial encounter for closed fracture: Secondary | ICD-10-CM | POA: Diagnosis not present

## 2018-02-20 DIAGNOSIS — R200 Anesthesia of skin: Secondary | ICD-10-CM | POA: Diagnosis not present

## 2018-02-20 DIAGNOSIS — M542 Cervicalgia: Secondary | ICD-10-CM | POA: Insufficient documentation

## 2018-02-20 DIAGNOSIS — R9431 Abnormal electrocardiogram [ECG] [EKG]: Secondary | ICD-10-CM | POA: Diagnosis not present

## 2018-02-20 DIAGNOSIS — M810 Age-related osteoporosis without current pathological fracture: Secondary | ICD-10-CM | POA: Diagnosis not present

## 2018-02-20 DIAGNOSIS — H538 Other visual disturbances: Secondary | ICD-10-CM | POA: Diagnosis not present

## 2018-02-20 DIAGNOSIS — M5412 Radiculopathy, cervical region: Secondary | ICD-10-CM | POA: Insufficient documentation

## 2018-02-20 DIAGNOSIS — Z885 Allergy status to narcotic agent status: Secondary | ICD-10-CM | POA: Diagnosis not present

## 2018-02-20 DIAGNOSIS — I4589 Other specified conduction disorders: Secondary | ICD-10-CM | POA: Diagnosis not present

## 2018-02-20 DIAGNOSIS — M47812 Spondylosis without myelopathy or radiculopathy, cervical region: Secondary | ICD-10-CM | POA: Diagnosis not present

## 2018-02-20 NOTE — Telephone Encounter (Signed)
Physical Therapist states pt is testing positive her cerebral artery . Please call.

## 2018-02-20 NOTE — Telephone Encounter (Signed)
Received a call from Janna Arch at [facility] PT. According to notes, patient referred there by Dr. Earnie Larsson, [facility]. Lenda Kelp states patient has tested positive today for cerebral artery dysfunction with symptoms of facial numbness for the past few weeks. She states she is sending patient to our office now. Santiago Bur if she has concerns for patient health and safety, she should escort her to the ED now. Marina verbalized understanding.

## 2018-02-20 NOTE — Therapy (Signed)
[place] MAIN [facility] SERVICES [address] [place], [place], [address] Phone: [phone]   Fax:  [phone]  Physical Therapy Evaluation  Patient Details  Name: Rachel Torres MRN: [id number] Date of Birth: 05/14/1956 Referring Provider: Mallie Mussel pool   Encounter Date: 02/20/2018  PT End of Session - 02/20/18 0908    Visit Number  1    Number of Visits  8    Date for PT Re-Evaluation  03/20/18    PT Start Time  0756    PT Stop Time  0840    PT Time Calculation (min)  44 min    Activity Tolerance  Treatment limited secondary to medical complications (Comment)    Behavior During Therapy  [facility] for tasks assessed/performed       Past Medical History:  Diagnosis Date  . AAA (abdominal aortic aneurysm) ([place])    a. Chronic w/o evidence of aneurysmal dil on CTA 01/2016.  Marland Kitchen Allergy   . Anemia   . Anxiety   . Asthma   . CAD (coronary artery disease)    a. 08/2010 s/p CABG x 1 (VG->RCA) @ time of Ao dissection repair; b. 01/2016 Lexiscan MV: mid antsept/apical defect w/ ? peri-infarct ischemia-->likely attenuation-->Med Rx.  . Chronic combined systolic (congestive) and diastolic (congestive) heart failure ([place])    a. 2012 EF 30-35%; b. 04/2015 EF 40-45%; c. 01/2016 Echo: Ef 55-65%, nl AoV bioprosthesis, nl RV, nl PASP.  Marland Kitchen Depression   . Gallbladder sludge   . GERD (gastroesophageal reflux disease)   . History of Bicuspid Aortic Valve    a. 08/2010 s/p AVR @ time of Ao dissection repair; b. 01/2016 Echo: Ef 55-65%, nl AoV bioprosthesis, nl RV, nl PASP.  Marland Kitchen Hx of repair of dissecting thoracic aortic aneurysm, Stanford type A    a. 08/2010 s/p repair with AVR and VG->RCA; b. 01/2016 CTA: stable appearance of Asc Thoracic Aortic graft. Opacification of flase lumen of chronic abd Ao dissection w/ retrograde flow through lumbar arteries. No aneurysmal dil of flase lumen.  . Hyperlipidemia   . Hypertensive heart disease   . IBS (irritable bowel syndrome)    . Internal hemorrhoids   . Marfan syndrome    pt denies  . Migraine   . Obstructive sleep apnea   . Osteoporosis   . Pneumonia   . RA (rheumatoid arthritis) ([place])    a. Followed by Dr. [name]    Past Surgical History:  Procedure Laterality Date  . ABDOMINAL AORTIC ANEURYSM REPAIR    . CARDIAC CATHETERIZATION  2001  . [place]  . CHOLECYSTECTOMY    . COLONOSCOPY    . FRACTURE SURGERY Right    shoulder replacement  . HEMORRHOID BANDING    . ORIF DISTAL RADIUS FRACTURE Left   . UPPER GASTROINTESTINAL ENDOSCOPY      There were no vitals filed for this visit.    PAIN: Worst: 8/10 Best 0/10  PROM/AROM:    Right Left  Flexion 44  Extension 55  Side Bending 30 35 hurt R cervical  Rotation 39 41   Shoulder ROM WFL: painful between shoulder blades   STRENGTH:  Graded on a 0-5 scale UE strength WFL   SENSATION: WFL   SPECIAL TESTS: Bakody's sign - : hurts arms (test is limited by shoulder PMH)  Spurlings test + on R, -on L  + CAD  FUNCTIONAL MOBILITY: Coordination normal    OUTCOME MEASURES: TEST Outcome Interpretation  NDI:  46% Mod disability                          [facility] PT Assessment - 02/20/18 0001      Assessment   Medical Diagnosis  cervical radiculopathy     Referring Provider  henry pool    Onset Date/Surgical Date  02/21/11    Hand Dominance  Right    Next MD Visit  going to ED now    Prior Therapy  yes but none for the neck       Precautions   Precautions  None      Restrictions   Weight Bearing Restrictions  No      Balance Screen   Has the patient fallen in the past 6 months  Yes    How many times?  3    Has the patient had a decrease in activity level because of a fear of falling?   Yes    Is the patient reluctant to leave their home because of a fear of falling?   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      Cognition   Overall Cognitive Status  Within Functional Limits for tasks assessed      Sensation   Light Touch  Appears Intact    Proprioception  Appears Intact      Coordination   Gross Motor Movements are Fluid and Coordinated  Yes    Finger Nose Finger Test  fluid, slight pain in R shoulder       Posture/Postural Control   Posture/Postural Control  Postural limitations    Postural Limitations  Rounded Shoulders;Forward head      Special Tests    Special Tests  Cervical    Cervical Tests  Spurling's;Dictraction;other;other2      Spurling's   Findings  Positive    Side  Right            Objective measurements completed on examination: See above findings.              PT Education - 02/20/18 0908    Education provided  Yes    Education Details  CAD + implications, going to ED now for clearance    Person(s) Educated  Patient    Methods  Explanation;Demonstration;Verbal cues    Comprehension  Verbalized understanding;Returned demonstration       PT Short Term Goals - 02/20/18 0914      PT SHORT TERM GOAL #1   Title  Patient will be independent in home exercise program to improve strength/mobility for better functional independence with ADLs.    Time  2    Period  Weeks    Status  New    Target Date  03/06/18      PT SHORT TERM GOAL #2   Title  Patient will report a worst pain of 5/10 on VAS in neck to improve tolerance with ADLs and reduced symptoms with activities.     Baseline  3/4: worst pain 8/10    Time  2    Period  Weeks    Status  New    Target Date  03/06/18  PT Long Term Goals - 02/20/18 1021      PT LONG TERM GOAL #1   Title  Patient will report a worst pain of 3/10 on VAS in cervical spine to improve tolerance with ADLs and reduced symptoms with activities    Baseline  worst pain 8/10     Time  4    Period  Weeks    Status  New    Target Date  03/20/18      PT LONG TERM GOAL #2   Title  Patient will have no tingling in bilateral hands for two consecutive weeks to improve quality of life.     Baseline  3/4: bilateral tingling     Time  4    Period  Weeks    Status  New    Target Date  03/20/18      PT LONG TERM GOAL #3   Title  Patient will decrease NDI score to 28% to decrease level of perceived disability to minimal.     Baseline  3/4: 46%    Time  4    Period  Weeks    Status  New    Target Date  03/20/18      PT LONG TERM GOAL #4   Title  Patient will improve cervical rotation ROM to 60 degrees bilaterally to improve functional mobility.     Baseline  3/4: R 39 L 41    Time  4    Period  Weeks    Status  New    Target Date  03/20/18             Plan - 02/20/18 0909    Clinical Impression Statement  Physical Therapist called cardiologist, nurse at cardiologist refferred patient to ED. Educated patient on need to go to ED/Urgent care today for imaging prior to scheduling physical therapy appointments. Unable to perform hands on testing at this time due to CAD +'s, however upon AROM patient demonstrated limited mobility with R cervical tightness >L. Pain and radicular symptoms bilaterally indicate patien twill benefit from skilled physical therapy to reduce pain, improve cervical mobility and allow patient better QOL once patient is cleared.     History and Personal Factors relevant to plan of care:  This patient presents with 3, personal factors/ comorbidities xxx, and , 4  body elements including body structures and functions, activity limitations and or participation restrictions. Patient's condition is unstable    Clinical Presentation  Unstable    Clinical Presentation due to:  + CAD testing    Clinical Decision Making  High    Rehab Potential  Fair    Clinical Impairments Affecting Rehab Potential  + CAD testing, history of AAA, anemia, anxiety,  asthma, CAD, chronic combined ystolic heart failure, GERD, Marfan syndrome, OA, RA (+) good work Psychologist, forensic, motivated, age     PT Frequency  2x / week    PT Duration  4 weeks    PT Treatment/Interventions  ADLs/Self Care Home Management;Cryotherapy;Traction;Ultrasound;Therapeutic exercise;Therapeutic activities;Neuromuscular re-education;Patient/family education;Manual techniques;Passive range of motion;Dry needling;Energy conservation;Visual/perceptual remediation/compensation    PT Next Visit Plan  complete PROM evaluation, ULTT, MMT     PT Home Exercise Plan  give next session.    Recommended Other Services  go to ED for CAD testing    Consulted and Agree with Plan of Care  Patient       Patient will benefit from skilled therapeutic intervention in order to improve the following deficits and impairments:  Decreased activity tolerance, Decreased safety awareness, Decreased range of motion, Decreased mobility, Decreased strength, Impaired flexibility, Postural dysfunction, Improper body mechanics, Pain  Visit Diagnosis: Radiculopathy, cervical region  Cervicalgia  Abnormal posture     Problem List Patient Active Problem List   Diagnosis Date Noted  . Cough 09/08/2017  . Type 1 dissection of ascending aorta ([place]) 04/23/2017  . HPV (human papilloma virus) infection 12/01/2016  . Screening mammogram, encounter for 11/29/2016  . Estrogen deficiency 11/29/2016  . Encounter for routine gynecological examination 11/29/2016  . Grade III hemorrhoids 10/06/2016  . Tachycardia 08/14/2016  . Hemorrhoids 08/13/2016  . Atypical migraine 08/03/2016  . CAD (coronary artery disease)   . AAA (abdominal aortic aneurysm) ([place])   . Chronic combined systolic (congestive) and diastolic (congestive) heart failure ([place])   . Hyperglycemia 02/11/2016  . Generalized anxiety disorder 12/08/2015  . Panic attacks 07/28/2015  . Sensorineural hearing loss 03/27/2013  . Vitamin D deficiency 07/03/2012  . H/O  aortic dissection 05/12/2012  . Obesity (BMI 30.0-34.9) 11/21/2011  . Routine general medical examination at a health care facility 09/20/2011  . S/P AVR (aortic valve replacement) 01/07/2011  . CORONARY ARTERY BYPASS GRAFT, HX OF 01/07/2011  . Arthritis 12/20/2010  . H/O dissecting abdominal aortic aneurysm repair 08/24/2010  . IRRITABLE BOWEL SYNDROME 09/09/2009  . Obstructive sleep apnea 08/04/2009  . External hemorrhoid 06/26/2009  . Osteoporosis 01/09/2009  . Hyperlipidemia 07/26/2007  . Depression with anxiety 07/26/2007  . Essential hypertension 07/26/2007  . Allergic rhinitis 07/26/2007  . Asthma 07/26/2007  . GERD 07/26/2007  . Osteoarthritis 07/26/2007  . Ocular migraine 07/26/2007  . Mild intermittent asthma without complication 50/02/7047   Janna Arch, PT, DPT   Janna Arch 02/20/2018, 10:26 AM  [place] MAIN [facility] SERVICES [address] [place], [place], [address] Phone: [phone]   Fax:  [phone]  Name: Rachel Torres MRN: [id number] Date of Birth: [birth year]

## 2018-02-27 ENCOUNTER — Ambulatory Visit: Payer: Medicare HMO | Admitting: Psychology

## 2018-02-27 ENCOUNTER — Ambulatory Visit: Payer: Medicare HMO

## 2018-02-27 DIAGNOSIS — M5412 Radiculopathy, cervical region: Secondary | ICD-10-CM | POA: Diagnosis not present

## 2018-02-27 DIAGNOSIS — M542 Cervicalgia: Secondary | ICD-10-CM | POA: Diagnosis not present

## 2018-02-27 DIAGNOSIS — R293 Abnormal posture: Secondary | ICD-10-CM

## 2018-02-27 NOTE — Therapy (Signed)
[place] MAIN [facility] SERVICES [address] [place], [place], [address] Phone: [phone]   Fax:  [phone]  Physical Therapy Treatment  Patient Details  Name: Rachel Torres MRN: [id number] Date of Birth: 11-Apr-1956 Referring Provider: Mallie Mussel pool   Encounter Date: 02/27/2018  PT End of Session - 02/27/18 1246    Visit Number  2    Number of Visits  8    Date for PT Re-Evaluation  03/20/18    PT Start Time  7989    PT Stop Time  1100    PT Time Calculation (min)  45 min    Activity Tolerance  Treatment limited secondary to medical complications (Comment)    Behavior During Therapy  [facility] for tasks assessed/performed       Past Medical History:  Diagnosis Date  . AAA (abdominal aortic aneurysm) ([place])    a. Chronic w/o evidence of aneurysmal dil on CTA 01/2016.  Marland Kitchen Allergy   . Anemia   . Anxiety   . Asthma   . CAD (coronary artery disease)    a. 08/2010 s/p CABG x 1 (VG->RCA) @ time of Ao dissection repair; b. 01/2016 Lexiscan MV: mid antsept/apical defect w/ ? peri-infarct ischemia-->likely attenuation-->Med Rx.  . Chronic combined systolic (congestive) and diastolic (congestive) heart failure ([place])    a. 2012 EF 30-35%; b. 04/2015 EF 40-45%; c. 01/2016 Echo: Ef 55-65%, nl AoV bioprosthesis, nl RV, nl PASP.  Marland Kitchen Depression   . Gallbladder sludge   . GERD (gastroesophageal reflux disease)   . History of Bicuspid Aortic Valve    a. 08/2010 s/p AVR @ time of Ao dissection repair; b. 01/2016 Echo: Ef 55-65%, nl AoV bioprosthesis, nl RV, nl PASP.  Marland Kitchen Hx of repair of dissecting thoracic aortic aneurysm, Stanford type A    a. 08/2010 s/p repair with AVR and VG->RCA; b. 01/2016 CTA: stable appearance of Asc Thoracic Aortic graft. Opacification of flase lumen of chronic abd Ao dissection w/ retrograde flow through lumbar arteries. No aneurysmal dil of flase lumen.  . Hyperlipidemia   . Hypertensive heart disease   . IBS (irritable bowel syndrome)    . Internal hemorrhoids   . Marfan syndrome    pt denies  . Migraine   . Obstructive sleep apnea   . Osteoporosis   . Pneumonia   . RA (rheumatoid arthritis) ([place])    a. Followed by Dr. [name]    Past Surgical History:  Procedure Laterality Date  . ABDOMINAL AORTIC ANEURYSM REPAIR    . CARDIAC CATHETERIZATION  2001  . [place]  . CHOLECYSTECTOMY    . COLONOSCOPY    . FRACTURE SURGERY Right    shoulder replacement  . HEMORRHOID BANDING    . ORIF DISTAL RADIUS FRACTURE Left   . UPPER GASTROINTESTINAL ENDOSCOPY      There were no vitals filed for this visit. complete PROM evaluation, ULTT, MMT   Subjective Assessment - 02/27/18 1237    Subjective  Patient reports doctor cleared her for CAD and for physical therapy. Having neck pain today upon waking up.     Pertinent History  Patient is a pleasant 62 year old female who presents with cervical neck pain with radiating symptoms to bilateral fingers. 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  Has high stress levels due to caregiver support. Patient has gained 30 lb.  Has AAA and numbness began seven years ago when  aneurysm occured. Sleeping with a neck pillow to relieve pain. Pain has been occurring for years.     Limitations  Sitting;Lifting;Standing;Walking;Writing;House hold activities;Other (comment);Reading    How long can you sit comfortably?  an hour     How long can you stand comfortably?  n/a    How long can you walk comfortably?  n/a    Diagnostic tests  n/a    Patient Stated Goals  decrease pain.     Currently in Pain?  Yes    Pain Score  8     Pain Location  Neck    Pain Orientation  Right;Left;Posterior    Pain Descriptors / Indicators  Aching;Radiating    Pain Type  Chronic pain    Pain Onset  More than a month ago    Pain Frequency  Intermittent      8/10 pain C4 LUPa relieved pain CPAs and UPAs painful to grade I-II mobilizations + ULTT Median  nerve   Sidebending with overpessure stretch 3x 20 seconds with R more tight than L Rotation l and R with overpressure stretch 3x20 seconds with R more tight than L.  Suboccipital release 4x 20 seconds   Towel AAROM extension 10x, SB 10x each side  YTB rows 10x ULT median nerve glide seated 10x   STM cervical and upper paraspinal; tighter on R cervical and upper thoracic region.    HEP given with patient demonstrating understanding.  Pain relieved    Pt. response to medical necessity:  Patient will continue to benefit from skilled physical therapy to reduce pain, improve cervical mobility, and allow patient better QOL.                  PT Education - 02/27/18 1244    Education provided  Yes    Education Details  HEP, upright posture, manual     Person(s) Educated  Patient    Methods  Explanation;Demonstration;Verbal cues    Comprehension  Verbalized understanding;Returned demonstration       PT Short Term Goals - 02/20/18 0914      PT SHORT TERM GOAL #1   Title  Patient will be independent in home exercise program to improve strength/mobility for better functional independence with ADLs.    Time  2    Period  Weeks    Status  New    Target Date  03/06/18      PT SHORT TERM GOAL #2   Title  Patient will report a worst pain of 5/10 on VAS in neck to improve tolerance with ADLs and reduced symptoms with activities.     Baseline  3/4: worst pain 8/10    Time  2    Period  Weeks    Status  New    Target Date  03/06/18        PT Long Term Goals - 02/20/18 1021      PT LONG TERM GOAL #1   Title  Patient will report a worst pain of 3/10 on VAS in cervical spine to improve tolerance with ADLs and reduced symptoms with activities    Baseline  worst pain 8/10    Time  4    Period  Weeks    Status  New    Target Date  03/20/18      PT LONG TERM GOAL #2   Title  Patient will have no tingling in bilateral hands for two consecutive weeks to improve quality  of life.  Baseline  3/4: bilateral tingling     Time  4    Period  Weeks    Status  New    Target Date  03/20/18      PT LONG TERM GOAL #3   Title  Patient will decrease NDI score to 28% to decrease level of perceived disability to minimal.     Baseline  3/4: 46%    Time  4    Period  Weeks    Status  New    Target Date  03/20/18      PT LONG TERM GOAL #4   Title  Patient will improve cervical rotation ROM to 60 degrees bilaterally to improve functional mobility.     Baseline  3/4: R 39 L 41    Time  4    Period  Weeks    Status  New    Target Date  03/20/18            Plan - 02/27/18 1248    Clinical Impression Statement  Patient cleared for physical therapy at this time. Demonstrated tight R cervical musculature and median nerve bias. Towel stretch, rows, and nerve glide given for HEP with patient demonstrated understanding. Patient challenged with upright posture at this time. Patient will continue to benefit from skilled physical therapy to reduce pain, improve cervical mobility, and allow patient better QOL.    Rehab Potential  Fair    Clinical Impairments Affecting Rehab Potential  + CAD testing, history of AAA, anemia, anxiety, asthma, CAD, chronic combined ystolic heart failure, GERD, Marfan syndrome, OA, RA (+) good work Psychologist, forensic, motivated, age     PT Frequency  2x / week    PT Duration  4 weeks    PT Treatment/Interventions  ADLs/Self Care Home Management;Cryotherapy;Traction;Ultrasound;Therapeutic exercise;Therapeutic activities;Neuromuscular re-education;Patient/family education;Manual techniques;Passive range of motion;Dry needling;Energy conservation;Visual/perceptual remediation/compensation    PT Next Visit Plan  complete PROM evaluation, ULTT, MMT     PT Home Exercise Plan  give next session.    Consulted and Agree with Plan of Care  Patient       Patient will benefit from skilled therapeutic intervention in order to improve the following deficits and  impairments:  Decreased activity tolerance, Decreased safety awareness, Decreased range of motion, Decreased mobility, Decreased strength, Impaired flexibility, Postural dysfunction, Improper body mechanics, Pain  Visit Diagnosis: Radiculopathy, cervical region  Cervicalgia  Abnormal posture     Problem List Patient Active Problem List   Diagnosis Date Noted  . Cough 09/08/2017  . Type 1 dissection of ascending aorta ([place]) 04/23/2017  . HPV (human papilloma virus) infection 12/01/2016  . Screening mammogram, encounter for 11/29/2016  . Estrogen deficiency 11/29/2016  . Encounter for routine gynecological examination 11/29/2016  . Grade III hemorrhoids 10/06/2016  . Tachycardia 08/14/2016  . Hemorrhoids 08/13/2016  . Atypical migraine 08/03/2016  . CAD (coronary artery disease)   . AAA (abdominal aortic aneurysm) ([place])   . Chronic combined systolic (congestive) and diastolic (congestive) heart failure ([place])   . Hyperglycemia 02/11/2016  . Generalized anxiety disorder 12/08/2015  . Panic attacks 07/28/2015  . Sensorineural hearing loss 03/27/2013  . Vitamin D deficiency 07/03/2012  . H/O aortic dissection 05/12/2012  . Obesity (BMI 30.0-34.9) 11/21/2011  . Routine general medical examination at a health care facility 09/20/2011  . S/P AVR (aortic valve replacement) 01/07/2011  . CORONARY ARTERY BYPASS GRAFT, HX OF 01/07/2011  . Arthritis 12/20/2010  . H/O dissecting abdominal aortic aneurysm repair 08/24/2010  . IRRITABLE  BOWEL SYNDROME 09/09/2009  . Obstructive sleep apnea 08/04/2009  . External hemorrhoid 06/26/2009  . Osteoporosis 01/09/2009  . Hyperlipidemia 07/26/2007  . Depression with anxiety 07/26/2007  . Essential hypertension 07/26/2007  . Allergic rhinitis 07/26/2007  . Asthma 07/26/2007  . GERD 07/26/2007  . Osteoarthritis 07/26/2007  . Ocular migraine 07/26/2007  . Mild intermittent asthma without complication 59/29/2446   Janna Arch, PT,  DPT    Janna Arch 02/27/2018, 12:49 PM  [place] MAIN [facility] SERVICES [address] [place], [place], [address] Phone: [phone]   Fax:  [phone]  Name: Rachel Torres MRN: [id number] Date of Birth: 07-Feb-1956

## 2018-03-01 ENCOUNTER — Ambulatory Visit: Payer: Medicare HMO

## 2018-03-01 DIAGNOSIS — R293 Abnormal posture: Secondary | ICD-10-CM

## 2018-03-01 DIAGNOSIS — M5412 Radiculopathy, cervical region: Secondary | ICD-10-CM

## 2018-03-01 DIAGNOSIS — M542 Cervicalgia: Secondary | ICD-10-CM | POA: Diagnosis not present

## 2018-03-01 NOTE — Therapy (Signed)
[place] MAIN [facility] SERVICES [address] [place], [place], [address] Phone: [phone]   Fax:  [phone]  Physical Therapy Treatment  Patient Details  Name: Rachel Torres MRN: [id number] Date of Birth: 10-15-1956 Referring Provider: Mallie Mussel pool   Encounter Date: 03/01/2018  PT End of Session - 03/01/18 1028    Visit Number  3    Number of Visits  8    Date for PT Re-Evaluation  03/20/18    PT Start Time  0932    PT Stop Time  1018    PT Time Calculation (min)  46 min    Activity Tolerance  Treatment limited secondary to medical complications (Comment);Patient tolerated treatment well    Behavior During Therapy  [facility] for tasks assessed/performed       Past Medical History:  Diagnosis Date  . AAA (abdominal aortic aneurysm) ([place])    a. Chronic w/o evidence of aneurysmal dil on CTA 01/2016.  Marland Kitchen Allergy   . Anemia   . Anxiety   . Asthma   . CAD (coronary artery disease)    a. 08/2010 s/p CABG x 1 (VG->RCA) @ time of Ao dissection repair; b. 01/2016 Lexiscan MV: mid antsept/apical defect w/ ? peri-infarct ischemia-->likely attenuation-->Med Rx.  . Chronic combined systolic (congestive) and diastolic (congestive) heart failure ([place])    a. 2012 EF 30-35%; b. 04/2015 EF 40-45%; c. 01/2016 Echo: Ef 55-65%, nl AoV bioprosthesis, nl RV, nl PASP.  Marland Kitchen Depression   . Gallbladder sludge   . GERD (gastroesophageal reflux disease)   . History of Bicuspid Aortic Valve    a. 08/2010 s/p AVR @ time of Ao dissection repair; b. 01/2016 Echo: Ef 55-65%, nl AoV bioprosthesis, nl RV, nl PASP.  Marland Kitchen Hx of repair of dissecting thoracic aortic aneurysm, Stanford type A    a. 08/2010 s/p repair with AVR and VG->RCA; b. 01/2016 CTA: stable appearance of Asc Thoracic Aortic graft. Opacification of flase lumen of chronic abd Ao dissection w/ retrograde flow through lumbar arteries. No aneurysmal dil of flase lumen.  . Hyperlipidemia   . Hypertensive heart disease   .  IBS (irritable bowel syndrome)   . Internal hemorrhoids   . Marfan syndrome    pt denies  . Migraine   . Obstructive sleep apnea   . Osteoporosis   . Pneumonia   . RA (rheumatoid arthritis) ([place])    a. Followed by Dr. [name]    Past Surgical History:  Procedure Laterality Date  . ABDOMINAL AORTIC ANEURYSM REPAIR    . CARDIAC CATHETERIZATION  2001  . [place]  . CHOLECYSTECTOMY    . COLONOSCOPY    . FRACTURE SURGERY Right    shoulder replacement  . HEMORRHOID BANDING    . ORIF DISTAL RADIUS FRACTURE Left   . UPPER GASTROINTESTINAL ENDOSCOPY      There were no vitals filed for this visit.  Subjective Assessment - 03/01/18 0935    Subjective  Patient presents with compliance to HEP. Frustrated with husband as she is her caregiver and he is non compliant/ high fall risk.     Pertinent History  Patient is a pleasant 62 year old female who presents with cervical neck pain with radiating symptoms to bilateral fingers. 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  Has high stress levels due to caregiver support. Patient has gained 30 lb.  Has AAA and numbness began seven years ago  when aneurysm occured. Sleeping with a neck pillow to relieve pain. Pain has been occurring for years.     Limitations  Sitting;Lifting;Standing;Walking;Writing;House hold activities;Other (comment);Reading    How long can you sit comfortably?  an hour     How long can you stand comfortably?  n/a    How long can you walk comfortably?  n/a    Diagnostic tests  n/a    Patient Stated Goals  decrease pain.     Currently in Pain?  Yes    Pain Score  3     Pain Location  Neck    Pain Orientation  Posterior    Pain Descriptors / Indicators  Aching;Radiating    Pain Type  Chronic pain    Pain Onset  More than a month ago    Pain Frequency  Intermittent         Sidebending with overpessure stretch 5x 20 seconds with R more tight than L Rotation l  and R with overpressure stretch 5x20 seconds with R more tight than L.  Suboccipital release 4x 20 seconds   Grade I-III CPA and UPAs c2-T1 hypomobile throughout with pain relief upon L C4 ; 4x10 seconds each level Thoracic J sweep against thoracic curvature mobilizations grade I-III 5x10 seconds     ULT median nerve glide supine 10x RUE ULT radial nerve glide supine 10x RUE    Supine ER 10x OTB ; Min A for elbow placement on RUE    STM cervical and upper paraspinal; tighter on R cervical and upper thoracic region in seated position with head on table 8 minutes   Pt. response to medical necessity:  Patient will continue to benefit from skilled physical therapy to reduce pain, improve cervical mobility, and allow patient better QOL.                      PT Education - 03/01/18 1000    Education provided  Yes    Education Details  HEP compliance. posture, manual, nerve glides    Person(s) Educated  Patient    Methods  Explanation;Demonstration;Verbal cues    Comprehension  Verbalized understanding;Returned demonstration       PT Short Term Goals - 02/20/18 0914      PT SHORT TERM GOAL #1   Title  Patient will be independent in home exercise program to improve strength/mobility for better functional independence with ADLs.    Time  2    Period  Weeks    Status  New    Target Date  03/06/18      PT SHORT TERM GOAL #2   Title  Patient will report a worst pain of 5/10 on VAS in neck to improve tolerance with ADLs and reduced symptoms with activities.     Baseline  3/4: worst pain 8/10    Time  2    Period  Weeks    Status  New    Target Date  03/06/18        PT Long Term Goals - 02/20/18 1021      PT LONG TERM GOAL #1   Title  Patient will report a worst pain of 3/10 on VAS in cervical spine to improve tolerance with ADLs and reduced symptoms with activities    Baseline  worst pain 8/10    Time  4    Period  Weeks    Status  New    Target Date   03/20/18  PT LONG TERM GOAL #2   Title  Patient will have no tingling in bilateral hands for two consecutive weeks to improve quality of life.     Baseline  3/4: bilateral tingling     Time  4    Period  Weeks    Status  New    Target Date  03/20/18      PT LONG TERM GOAL #3   Title  Patient will decrease NDI score to 28% to decrease level of perceived disability to minimal.     Baseline  3/4: 46%    Time  4    Period  Weeks    Status  New    Target Date  03/20/18      PT LONG TERM GOAL #4   Title  Patient will improve cervical rotation ROM to 60 degrees bilaterally to improve functional mobility.     Baseline  3/4: R 39 L 41    Time  4    Period  Weeks    Status  New    Target Date  03/20/18            Plan - 03/01/18 1028    Clinical Impression Statement   Patient continues to demonstrate limited muscle tissue length of cervical musculature, however tissue elasticity improving with repetition. Decreased pain after manual. Patient challenged with nerve glides and posture exercises due to history of R shoulder surgery.  Patient will continue to benefit from skilled physical therapy to reduce pain, improve cervical mobility, and allow patient better QOL    Rehab Potential  Fair    Clinical Impairments Affecting Rehab Potential  + CAD testing, history of AAA, anemia, anxiety, asthma, CAD, chronic combined ystolic heart failure, GERD, Marfan syndrome, OA, RA (+) good work Psychologist, forensic, motivated, age     PT Frequency  2x / week    PT Duration  4 weeks    PT Treatment/Interventions  ADLs/Self Care Home Management;Cryotherapy;Traction;Ultrasound;Therapeutic exercise;Therapeutic activities;Neuromuscular re-education;Patient/family education;Manual techniques;Passive range of motion;Dry needling;Energy conservation;Visual/perceptual remediation/compensation    PT Next Visit Plan  nerve glides, muscle tissue length, posture    PT Home Exercise Plan  give next session.    Consulted and  Agree with Plan of Care  Patient       Patient will benefit from skilled therapeutic intervention in order to improve the following deficits and impairments:  Decreased activity tolerance, Decreased safety awareness, Decreased range of motion, Decreased mobility, Decreased strength, Impaired flexibility, Postural dysfunction, Improper body mechanics, Pain  Visit Diagnosis: Radiculopathy, cervical region  Cervicalgia  Abnormal posture     Problem List Patient Active Problem List   Diagnosis Date Noted  . Cough 09/08/2017  . Type 1 dissection of ascending aorta ([place]) 04/23/2017  . HPV (human papilloma virus) infection 12/01/2016  . Screening mammogram, encounter for 11/29/2016  . Estrogen deficiency 11/29/2016  . Encounter for routine gynecological examination 11/29/2016  . Grade III hemorrhoids 10/06/2016  . Tachycardia 08/14/2016  . Hemorrhoids 08/13/2016  . Atypical migraine 08/03/2016  . CAD (coronary artery disease)   . AAA (abdominal aortic aneurysm) ([place])   . Chronic combined systolic (congestive) and diastolic (congestive) heart failure ([place])   . Hyperglycemia 02/11/2016  . Generalized anxiety disorder 12/08/2015  . Panic attacks 07/28/2015  . Sensorineural hearing loss 03/27/2013  . Vitamin D deficiency 07/03/2012  . H/O aortic dissection 05/12/2012  . Obesity (BMI 30.0-34.9) 11/21/2011  . Routine general medical examination at a health care facility 09/20/2011  . S/P  AVR (aortic valve replacement) 01/07/2011  . CORONARY ARTERY BYPASS GRAFT, HX OF 01/07/2011  . Arthritis 12/20/2010  . H/O dissecting abdominal aortic aneurysm repair 08/24/2010  . IRRITABLE BOWEL SYNDROME 09/09/2009  . Obstructive sleep apnea 08/04/2009  . External hemorrhoid 06/26/2009  . Osteoporosis 01/09/2009  . Hyperlipidemia 07/26/2007  . Depression with anxiety 07/26/2007  . Essential hypertension 07/26/2007  . Allergic rhinitis 07/26/2007  . Asthma 07/26/2007  . GERD 07/26/2007  .  Osteoarthritis 07/26/2007  . Ocular migraine 07/26/2007  . Mild intermittent asthma without complication 17/91/5056   Janna Arch, PT, DPT   Janna Arch 03/01/2018, 10:29 AM  [place] MAIN [facility] SERVICES [address] [place], [place], [address] Phone: [phone]   Fax:  [phone]  Name: Rachel Torres MRN: [id number] Date of Birth: [birth year]

## 2018-03-03 ENCOUNTER — Ambulatory Visit (INDEPENDENT_AMBULATORY_CARE_PROVIDER_SITE_OTHER): Payer: Medicare HMO | Admitting: Psychology

## 2018-03-03 DIAGNOSIS — F4323 Adjustment disorder with mixed anxiety and depressed mood: Secondary | ICD-10-CM

## 2018-03-06 ENCOUNTER — Ambulatory Visit (INDEPENDENT_AMBULATORY_CARE_PROVIDER_SITE_OTHER): Payer: Medicare HMO | Admitting: Psychology

## 2018-03-06 DIAGNOSIS — F4323 Adjustment disorder with mixed anxiety and depressed mood: Secondary | ICD-10-CM | POA: Diagnosis not present

## 2018-03-08 ENCOUNTER — Ambulatory Visit: Payer: Medicare HMO

## 2018-03-08 DIAGNOSIS — R293 Abnormal posture: Secondary | ICD-10-CM

## 2018-03-08 DIAGNOSIS — M542 Cervicalgia: Secondary | ICD-10-CM | POA: Diagnosis not present

## 2018-03-08 DIAGNOSIS — M5412 Radiculopathy, cervical region: Secondary | ICD-10-CM

## 2018-03-08 NOTE — Therapy (Signed)
[place] MAIN [facility] SERVICES [address] [place], [place], [address] Phone: [phone]   Fax:  [phone]  Physical Therapy Treatment  Patient Details  Name: Rachel Torres MRN: [id number] Date of Birth: 16-May-1956 Referring Provider: Mallie Mussel pool   Encounter Date: 03/08/2018  PT End of Session - 03/08/18 1025    Visit Number  4    Number of Visits  8    Date for PT Re-Evaluation  03/20/18    PT Start Time  0932    PT Stop Time  1017    PT Time Calculation (min)  45 min    Activity Tolerance  Treatment limited secondary to medical complications (Comment);Patient tolerated treatment well    Behavior During Therapy  [facility] for tasks assessed/performed       Past Medical History:  Diagnosis Date  . AAA (abdominal aortic aneurysm) ([place])    a. Chronic w/o evidence of aneurysmal dil on CTA 01/2016.  Marland Kitchen Allergy   . Anemia   . Anxiety   . Asthma   . CAD (coronary artery disease)    a. 08/2010 s/p CABG x 1 (VG->RCA) @ time of Ao dissection repair; b. 01/2016 Lexiscan MV: mid antsept/apical defect w/ ? peri-infarct ischemia-->likely attenuation-->Med Rx.  . Chronic combined systolic (congestive) and diastolic (congestive) heart failure ([place])    a. 2012 EF 30-35%; b. 04/2015 EF 40-45%; c. 01/2016 Echo: Ef 55-65%, nl AoV bioprosthesis, nl RV, nl PASP.  Marland Kitchen Depression   . Gallbladder sludge   . GERD (gastroesophageal reflux disease)   . History of Bicuspid Aortic Valve    a. 08/2010 s/p AVR @ time of Ao dissection repair; b. 01/2016 Echo: Ef 55-65%, nl AoV bioprosthesis, nl RV, nl PASP.  Marland Kitchen Hx of repair of dissecting thoracic aortic aneurysm, Stanford type A    a. 08/2010 s/p repair with AVR and VG->RCA; b. 01/2016 CTA: stable appearance of Asc Thoracic Aortic graft. Opacification of flase lumen of chronic abd Ao dissection w/ retrograde flow through lumbar arteries. No aneurysmal dil of flase lumen.  . Hyperlipidemia   . Hypertensive heart disease   .  IBS (irritable bowel syndrome)   . Internal hemorrhoids   . Marfan syndrome    pt denies  . Migraine   . Obstructive sleep apnea   . Osteoporosis   . Pneumonia   . RA (rheumatoid arthritis) ([place])    a. Followed by Dr. [name]    Past Surgical History:  Procedure Laterality Date  . ABDOMINAL AORTIC ANEURYSM REPAIR    . CARDIAC CATHETERIZATION  2001  . [place]  . CHOLECYSTECTOMY    . COLONOSCOPY    . FRACTURE SURGERY Right    shoulder replacement  . HEMORRHOID BANDING    . ORIF DISTAL RADIUS FRACTURE Left   . UPPER GASTROINTESTINAL ENDOSCOPY      There were no vitals filed for this visit.  Subjective Assessment - 03/08/18 1023    Subjective  Patient reports compliance with HEP.  Has some headaches but no radiating symptoms.     Pertinent History  Patient is a pleasant 62 year old female who presents with cervical neck pain with radiating symptoms to bilateral fingers. 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  Has high stress levels due to caregiver support. Patient has gained 30 lb.  Has AAA and numbness began seven years ago when aneurysm occured. Sleeping with a neck pillow to  relieve pain. Pain has been occurring for years.     Limitations  Sitting;Lifting;Standing;Walking;Writing;House hold activities;Other (comment);Reading    How long can you sit comfortably?  an hour     How long can you stand comfortably?  n/a    How long can you walk comfortably?  n/a    Diagnostic tests  n/a    Patient Stated Goals  decrease pain.     Currently in Pain?  No/denies        Sidebending with overpessure stretch 5x 20 seconds with R more tight than L Rotation l and R with overpressure stretch 5x20 seconds with R more tight than L.  Suboccipital release 4x 20 seconds    Grade I-III CPA and UPAs c2-T1 hypomobile throughout with pain relief upon L C4 ; 4x10 seconds each level Thoracic J sweep against thoracic curvature  mobilizations grade I-III 5x10 seconds      ULT median nerve glide supine 10x RUE ULT radial nerve glide supine 10x RUE     Supine ER 10x OTB ; Min A for elbow placement on RUE    STM cervical and upper paraspinal; tighter on R cervical and upper thoracic region in seated position with head on table 8 minutes    Pt. response to medical necessity:  Patient will continue to benefit from skilled physical therapy to reduce pain, improve cervical mobility, and allow patient better QOL                         PT Short Term Goals - 02/20/18 0914      PT SHORT TERM GOAL #1   Title  Patient will be independent in home exercise program to improve strength/mobility for better functional independence with ADLs.    Time  2    Period  Weeks    Status  New    Target Date  03/06/18      PT SHORT TERM GOAL #2   Title  Patient will report a worst pain of 5/10 on VAS in neck to improve tolerance with ADLs and reduced symptoms with activities.     Baseline  3/4: worst pain 8/10    Time  2    Period  Weeks    Status  New    Target Date  03/06/18        PT Long Term Goals - 02/20/18 1021      PT LONG TERM GOAL #1   Title  Patient will report a worst pain of 3/10 on VAS in cervical spine to improve tolerance with ADLs and reduced symptoms with activities    Baseline  worst pain 8/10    Time  4    Period  Weeks    Status  New    Target Date  03/20/18      PT LONG TERM GOAL #2   Title  Patient will have no tingling in bilateral hands for two consecutive weeks to improve quality of life.     Baseline  3/4: bilateral tingling     Time  4    Period  Weeks    Status  New    Target Date  03/20/18      PT LONG TERM GOAL #3   Title  Patient will decrease NDI score to 28% to decrease level of perceived disability to minimal.     Baseline  3/4: 46%    Time  4    Period  Weeks  Status  New    Target Date  03/20/18      PT LONG TERM GOAL #4   Title  Patient will  improve cervical rotation ROM to 60 degrees bilaterally to improve functional mobility.     Baseline  3/4: R 39 L 41    Time  4    Period  Weeks    Status  New    Target Date  03/20/18            Plan - 03/08/18 1025    Clinical Impression Statement  Patient's lipoma limits patient's ability to perform postural interventions and prevents scapular depression and retraction required for correct cervical muscle length. Decreased neurological radiating symptoms noted with no re-occurrence since treatment has started. Patient will continue to benefit from skilled physical therapy to reduce pain, improve cervical mobility, and allow patient better QO    Rehab Potential  Fair    Clinical Impairments Affecting Rehab Potential  + CAD testing, history of AAA, anemia, anxiety, asthma, CAD, chronic combined ystolic heart failure, GERD, Marfan syndrome, OA, RA (+) good work Psychologist, forensic, motivated, age     PT Frequency  2x / week    PT Duration  4 weeks    PT Treatment/Interventions  ADLs/Self Care Home Management;Cryotherapy;Traction;Ultrasound;Therapeutic exercise;Therapeutic activities;Neuromuscular re-education;Patient/family education;Manual techniques;Passive range of motion;Dry needling;Energy conservation;Visual/perceptual remediation/compensation    PT Next Visit Plan  nerve glides, muscle tissue length, posture    PT Home Exercise Plan  give next session.    Consulted and Agree with Plan of Care  Patient       Patient will benefit from skilled therapeutic intervention in order to improve the following deficits and impairments:  Decreased activity tolerance, Decreased safety awareness, Decreased range of motion, Decreased mobility, Decreased strength, Impaired flexibility, Postural dysfunction, Improper body mechanics, Pain  Visit Diagnosis: Radiculopathy, cervical region  Cervicalgia  Abnormal posture     Problem List Patient Active Problem List   Diagnosis Date Noted  . Cough  09/08/2017  . Type 1 dissection of ascending aorta ([place]) 04/23/2017  . HPV (human papilloma virus) infection 12/01/2016  . Screening mammogram, encounter for 11/29/2016  . Estrogen deficiency 11/29/2016  . Encounter for routine gynecological examination 11/29/2016  . Grade III hemorrhoids 10/06/2016  . Tachycardia 08/14/2016  . Hemorrhoids 08/13/2016  . Atypical migraine 08/03/2016  . CAD (coronary artery disease)   . AAA (abdominal aortic aneurysm) ([place])   . Chronic combined systolic (congestive) and diastolic (congestive) heart failure ([place])   . Hyperglycemia 02/11/2016  . Generalized anxiety disorder 12/08/2015  . Panic attacks 07/28/2015  . Sensorineural hearing loss 03/27/2013  . Vitamin D deficiency 07/03/2012  . H/O aortic dissection 05/12/2012  . Obesity (BMI 30.0-34.9) 11/21/2011  . Routine general medical examination at a health care facility 09/20/2011  . S/P AVR (aortic valve replacement) 01/07/2011  . CORONARY ARTERY BYPASS GRAFT, HX OF 01/07/2011  . Arthritis 12/20/2010  . H/O dissecting abdominal aortic aneurysm repair 08/24/2010  . IRRITABLE BOWEL SYNDROME 09/09/2009  . Obstructive sleep apnea 08/04/2009  . External hemorrhoid 06/26/2009  . Osteoporosis 01/09/2009  . Hyperlipidemia 07/26/2007  . Depression with anxiety 07/26/2007  . Essential hypertension 07/26/2007  . Allergic rhinitis 07/26/2007  . Asthma 07/26/2007  . GERD 07/26/2007  . Osteoarthritis 07/26/2007  . Ocular migraine 07/26/2007  . Mild intermittent asthma without complication 16/60/6301   Janna Arch, PT, DPT   Janna Arch 03/08/2018, 10:26 AM  [place] MAIN [facility] SERVICES [phone]  [place], [place], [address] Phone: [phone]   Fax:  [phone]  Name: Rachel Torres MRN: [id number] Date of Birth: [birth year]

## 2018-03-13 ENCOUNTER — Ambulatory Visit: Payer: Medicare HMO

## 2018-03-13 DIAGNOSIS — M542 Cervicalgia: Secondary | ICD-10-CM | POA: Diagnosis not present

## 2018-03-13 DIAGNOSIS — R293 Abnormal posture: Secondary | ICD-10-CM

## 2018-03-13 DIAGNOSIS — M5412 Radiculopathy, cervical region: Secondary | ICD-10-CM | POA: Diagnosis not present

## 2018-03-13 NOTE — Therapy (Signed)
[place] MAIN [facility] SERVICES [address] [place], [place], [address] Phone: [phone]   Fax:  [phone]  Physical Therapy Treatment  Patient Details  Name: WYLENE WEISSMAN MRN: [id number] Date of Birth: 12-04-1956 Referring Provider: Mallie Mussel pool   Encounter Date: 03/13/2018  PT End of Session - 03/13/18 1100    Visit Number  5    Number of Visits  8    Date for PT Re-Evaluation  03/20/18    PT Start Time  9371    PT Stop Time  1059    PT Time Calculation (min)  44 min    Activity Tolerance  Treatment limited secondary to medical complications (Comment);Patient tolerated treatment well    Behavior During Therapy  [facility] for tasks assessed/performed       Past Medical History:  Diagnosis Date  . AAA (abdominal aortic aneurysm) ([place])    a. Chronic w/o evidence of aneurysmal dil on CTA 01/2016.  Marland Kitchen Allergy   . Anemia   . Anxiety   . Asthma   . CAD (coronary artery disease)    a. 08/2010 s/p CABG x 1 (VG->RCA) @ time of Ao dissection repair; b. 01/2016 Lexiscan MV: mid antsept/apical defect w/ ? peri-infarct ischemia-->likely attenuation-->Med Rx.  . Chronic combined systolic (congestive) and diastolic (congestive) heart failure ([place])    a. 2012 EF 30-35%; b. 04/2015 EF 40-45%; c. 01/2016 Echo: Ef 55-65%, nl AoV bioprosthesis, nl RV, nl PASP.  Marland Kitchen Depression   . Gallbladder sludge   . GERD (gastroesophageal reflux disease)   . History of Bicuspid Aortic Valve    a. 08/2010 s/p AVR @ time of Ao dissection repair; b. 01/2016 Echo: Ef 55-65%, nl AoV bioprosthesis, nl RV, nl PASP.  Marland Kitchen Hx of repair of dissecting thoracic aortic aneurysm, Stanford type A    a. 08/2010 s/p repair with AVR and VG->RCA; b. 01/2016 CTA: stable appearance of Asc Thoracic Aortic graft. Opacification of flase lumen of chronic abd Ao dissection w/ retrograde flow through lumbar arteries. No aneurysmal dil of flase lumen.  . Hyperlipidemia   . Hypertensive heart disease   .  IBS (irritable bowel syndrome)   . Internal hemorrhoids   . Marfan syndrome    pt denies  . Migraine   . Obstructive sleep apnea   . Osteoporosis   . Pneumonia   . RA (rheumatoid arthritis) ([place])    a. Followed by Dr. [name]    Past Surgical History:  Procedure Laterality Date  . ABDOMINAL AORTIC ANEURYSM REPAIR    . CARDIAC CATHETERIZATION  2001  . [place]  . CHOLECYSTECTOMY    . COLONOSCOPY    . FRACTURE SURGERY Right    shoulder replacement  . HEMORRHOID BANDING    . ORIF DISTAL RADIUS FRACTURE Left   . UPPER GASTROINTESTINAL ENDOSCOPY      There were no vitals filed for this visit.  Subjective Assessment - 03/13/18 1058    Subjective  Patient reports compliance with HEP, reduced neck pain. No more radiating symptoms. Will be seeing doctor this week about lipoma removal.     Pertinent History  Patient is a pleasant 62 year old female who presents with cervical neck pain with radiating symptoms to bilateral fingers. 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  Has high stress levels due to caregiver support. Patient has gained 30 lb.  Has AAA and numbness began seven years ago when  aneurysm occured. Sleeping with a neck pillow to relieve pain. Pain has been occurring for years.     Limitations  Sitting;Lifting;Standing;Walking;Writing;House hold activities;Other (comment);Reading    How long can you sit comfortably?  an hour     How long can you stand comfortably?  n/a    How long can you walk comfortably?  n/a    Diagnostic tests  n/a    Patient Stated Goals  decrease pain.     Currently in Pain?  Yes    Pain Score  1     Pain Location  Neck    Pain Orientation  Posterior    Pain Descriptors / Indicators  Aching    Pain Type  Chronic pain    Pain Onset  More than a month ago    Pain Frequency  Constant      Sidebending with overpessure stretch 5x 20 seconds with R more tight than L Rotation l and R with  overpressure stretch 5x20 seconds with R more tight than L.  Suboccipital release 4x 20 seconds    Grade I-III CPA and UPAs c2-T1 hypomobile throughout with pain relief upon L C4 ; 4x10 seconds each level Thoracic J sweep against thoracic curvature mobilizations grade I-III 5x10 seconds      ULT median nerve glide supine 10x RUE ULT radial nerve glide supine 10x RUE     Supine ER 10x GTB ; Min A for elbow placement on RUE    STM cervical and upper paraspinal; tighter on R cervical and upper thoracic region in seated position with head on table 8 minutes    Pt. response to medical necessity:  Patient will continue to benefit from skilled physical therapy to reduce pain, improve cervical mobility, and allow patient better QOL                No data recorded               PT Education - 03/13/18 1059    Education provided  Yes    Education Details  HEP compliance, posture, manual, nerve glides    Person(s) Educated  Patient    Methods  Explanation;Demonstration;Verbal cues    Comprehension  Verbalized understanding       PT Short Term Goals - 02/20/18 0914      PT SHORT TERM GOAL #1   Title  Patient will be independent in home exercise program to improve strength/mobility for better functional independence with ADLs.    Time  2    Period  Weeks    Status  New    Target Date  03/06/18      PT SHORT TERM GOAL #2   Title  Patient will report a worst pain of 5/10 on VAS in neck to improve tolerance with ADLs and reduced symptoms with activities.     Baseline  3/4: worst pain 8/10    Time  2    Period  Weeks    Status  New    Target Date  03/06/18        PT Long Term Goals - 02/20/18 1021      PT LONG TERM GOAL #1   Title  Patient will report a worst pain of 3/10 on VAS in cervical spine to improve tolerance with ADLs and reduced symptoms with activities    Baseline  worst pain 8/10    Time  4    Period  Weeks    Status  New  Target Date   03/20/18      PT LONG TERM GOAL #2   Title  Patient will have no tingling in bilateral hands for two consecutive weeks to improve quality of life.     Baseline  3/4: bilateral tingling     Time  4    Period  Weeks    Status  New    Target Date  03/20/18      PT LONG TERM GOAL #3   Title  Patient will decrease NDI score to 28% to decrease level of perceived disability to minimal.     Baseline  3/4: 46%    Time  4    Period  Weeks    Status  New    Target Date  03/20/18      PT LONG TERM GOAL #4   Title  Patient will improve cervical rotation ROM to 60 degrees bilaterally to improve functional mobility.     Baseline  3/4: R 39 L 41    Time  4    Period  Weeks    Status  New    Target Date  03/20/18            Plan - 03/13/18 1101    Clinical Impression Statement  Patient presents with improved muscle tissue length of cervical region with R continuing to be tighter than left. Mobilizations repeated reduced residual pain in combination with other manual techniques. Lipoma location continues to limit patients ability to perform postural correction techniques due to limited depression and retraction of scapula.   Patient will continue to benefit from skilled physical therapy to reduce pain, improve cervical mobility, and allow patient better QOL    Rehab Potential  Fair    Clinical Impairments Affecting Rehab Potential  + CAD testing, history of AAA, anemia, anxiety, asthma, CAD, chronic combined ystolic heart failure, GERD, Marfan syndrome, OA, RA (+) good work Psychologist, forensic, motivated, age     PT Frequency  2x / week    PT Duration  4 weeks    PT Treatment/Interventions  ADLs/Self Care Home Management;Cryotherapy;Traction;Ultrasound;Therapeutic exercise;Therapeutic activities;Neuromuscular re-education;Patient/family education;Manual techniques;Passive range of motion;Dry needling;Energy conservation;Visual/perceptual remediation/compensation    PT Next Visit Plan  nerve glides, muscle  tissue length, posture    PT Home Exercise Plan  give next session.    Consulted and Agree with Plan of Care  Patient       Patient will benefit from skilled therapeutic intervention in order to improve the following deficits and impairments:  Decreased activity tolerance, Decreased safety awareness, Decreased range of motion, Decreased mobility, Decreased strength, Impaired flexibility, Postural dysfunction, Improper body mechanics, Pain  Visit Diagnosis: Radiculopathy, cervical region  Cervicalgia  Abnormal posture     Problem List Patient Active Problem List   Diagnosis Date Noted  . Cough 09/08/2017  . Type 1 dissection of ascending aorta ([place]) 04/23/2017  . HPV (human papilloma virus) infection 12/01/2016  . Screening mammogram, encounter for 11/29/2016  . Estrogen deficiency 11/29/2016  . Encounter for routine gynecological examination 11/29/2016  . Grade III hemorrhoids 10/06/2016  . Tachycardia 08/14/2016  . Hemorrhoids 08/13/2016  . Atypical migraine 08/03/2016  . CAD (coronary artery disease)   . AAA (abdominal aortic aneurysm) ([place])   . Chronic combined systolic (congestive) and diastolic (congestive) heart failure ([place])   . Hyperglycemia 02/11/2016  . Generalized anxiety disorder 12/08/2015  . Panic attacks 07/28/2015  . Sensorineural hearing loss 03/27/2013  . Vitamin D deficiency 07/03/2012  . H/O aortic  dissection 05/12/2012  . Obesity (BMI 30.0-34.9) 11/21/2011  . Routine general medical examination at a health care facility 09/20/2011  . S/P AVR (aortic valve replacement) 01/07/2011  . CORONARY ARTERY BYPASS GRAFT, HX OF 01/07/2011  . Arthritis 12/20/2010  . H/O dissecting abdominal aortic aneurysm repair 08/24/2010  . IRRITABLE BOWEL SYNDROME 09/09/2009  . Obstructive sleep apnea 08/04/2009  . External hemorrhoid 06/26/2009  . Osteoporosis 01/09/2009  . Hyperlipidemia 07/26/2007  . Depression with anxiety 07/26/2007  . Essential hypertension  07/26/2007  . Allergic rhinitis 07/26/2007  . Asthma 07/26/2007  . GERD 07/26/2007  . Osteoarthritis 07/26/2007  . Ocular migraine 07/26/2007  . Mild intermittent asthma without complication 91/91/6606   Janna Arch, PT, DPT   Janna Arch 03/13/2018, 11:03 AM  [place] MAIN [facility] SERVICES [address] [place], [place], [address] Phone: [phone]   Fax:  [phone]  Name: MEAGEN LIMONES MRN: [id number] Date of Birth: 11-Nov-1956

## 2018-03-15 ENCOUNTER — Ambulatory Visit (INDEPENDENT_AMBULATORY_CARE_PROVIDER_SITE_OTHER): Payer: Medicare HMO | Admitting: Psychology

## 2018-03-15 DIAGNOSIS — F4323 Adjustment disorder with mixed anxiety and depressed mood: Secondary | ICD-10-CM | POA: Diagnosis not present

## 2018-03-16 DIAGNOSIS — M5412 Radiculopathy, cervical region: Secondary | ICD-10-CM | POA: Diagnosis not present

## 2018-03-20 ENCOUNTER — Ambulatory Visit: Payer: Medicare HMO

## 2018-03-20 ENCOUNTER — Ambulatory Visit: Payer: Medicare HMO | Admitting: Psychology

## 2018-03-22 ENCOUNTER — Ambulatory Visit: Payer: Medicare HMO | Attending: Neurosurgery

## 2018-03-22 DIAGNOSIS — M542 Cervicalgia: Secondary | ICD-10-CM | POA: Diagnosis not present

## 2018-03-22 DIAGNOSIS — M5412 Radiculopathy, cervical region: Secondary | ICD-10-CM | POA: Diagnosis not present

## 2018-03-22 DIAGNOSIS — R293 Abnormal posture: Secondary | ICD-10-CM | POA: Insufficient documentation

## 2018-03-22 NOTE — Therapy (Signed)
[place] MAIN [facility] SERVICES [address] [place], [place], [address] Phone: [phone]   Fax:  [phone]  Physical Therapy Treatment  Patient Details  Name: Rachel Torres MRN: [id number] Date of Birth: August 06, 1956 Referring Provider: Mallie Mussel pool   Encounter Date: 03/22/2018  PT End of Session - 03/22/18 0939    Visit Number  6    Number of Visits  16    Date for PT Re-Evaluation  04/19/18    PT Start Time  0930    PT Stop Time  1015    PT Time Calculation (min)  45 min    Activity Tolerance  Treatment limited secondary to medical complications (Comment);Patient tolerated treatment well    Behavior During Therapy  [facility] for tasks assessed/performed       Past Medical History:  Diagnosis Date  . AAA (abdominal aortic aneurysm) ([place])    a. Chronic w/o evidence of aneurysmal dil on CTA 01/2016.  Marland Kitchen Allergy   . Anemia   . Anxiety   . Asthma   . CAD (coronary artery disease)    a. 08/2010 s/p CABG x 1 (VG->RCA) @ time of Ao dissection repair; b. 01/2016 Lexiscan MV: mid antsept/apical defect w/ ? peri-infarct ischemia-->likely attenuation-->Med Rx.  . Chronic combined systolic (congestive) and diastolic (congestive) heart failure ([place])    a. 2012 EF 30-35%; b. 04/2015 EF 40-45%; c. 01/2016 Echo: Ef 55-65%, nl AoV bioprosthesis, nl RV, nl PASP.  Marland Kitchen Depression   . Gallbladder sludge   . GERD (gastroesophageal reflux disease)   . History of Bicuspid Aortic Valve    a. 08/2010 s/p AVR @ time of Ao dissection repair; b. 01/2016 Echo: Ef 55-65%, nl AoV bioprosthesis, nl RV, nl PASP.  Marland Kitchen Hx of repair of dissecting thoracic aortic aneurysm, Stanford type A    a. 08/2010 s/p repair with AVR and VG->RCA; b. 01/2016 CTA: stable appearance of Asc Thoracic Aortic graft. Opacification of flase lumen of chronic abd Ao dissection w/ retrograde flow through lumbar arteries. No aneurysmal dil of flase lumen.  . Hyperlipidemia   . Hypertensive heart disease   .  IBS (irritable bowel syndrome)   . Internal hemorrhoids   . Marfan syndrome    pt denies  . Migraine   . Obstructive sleep apnea   . Osteoporosis   . Pneumonia   . RA (rheumatoid arthritis) ([place])    a. Followed by Dr. [name]    Past Surgical History:  Procedure Laterality Date  . ABDOMINAL AORTIC ANEURYSM REPAIR    . CARDIAC CATHETERIZATION  2001  . [place]  . CHOLECYSTECTOMY    . COLONOSCOPY    . FRACTURE SURGERY Right    shoulder replacement  . HEMORRHOID BANDING    . ORIF DISTAL RADIUS FRACTURE Left   . UPPER GASTROINTESTINAL ENDOSCOPY      There were no vitals filed for this visit.  Subjective Assessment - 03/22/18 0938    Subjective  Patient reports having slight regression in symptoms due to increase stress from husband being in hospital/having surgery. Reports doctor said mass on thoracic spine is not lipoma. Compliant with HEP and reports although having backslide is feeling much better than start.     Pertinent History  Patient is a pleasant 62 year old female who presents with cervical neck pain with radiating symptoms to bilateral fingers. 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  Has high stress  levels due to caregiver support. Patient has gained 30 lb.  Has AAA and numbness began seven years ago when aneurysm occured. Sleeping with a neck pillow to relieve pain. Pain has been occurring for years.     Limitations  Sitting;Lifting;Standing;Walking;Writing;House hold activities;Other (comment);Reading    How long can you sit comfortably?  an hour     How long can you stand comfortably?  n/a    How long can you walk comfortably?  n/a    Diagnostic tests  n/a    Patient Stated Goals  decrease pain.     Currently in Pain?  Yes    Pain Score  3     Pain Location  Neck    Pain Orientation  Posterior    Pain Descriptors / Indicators  Aching;Tightness    Pain Type  Chronic pain    Pain Onset  More than a  month ago    Pain Frequency  Constant       VAS: pain right now: 3/10  Worst pain: 6/10  NDI: 44%      Right Left  Flexion 42  Extension 65  Side Bending 35 40  Rotation 42 46      Sidebending with overpessure stretch 5x 20 seconds with R more tight than L Rotation l and R with overpressure stretch 5x20 seconds with R more tight than L.  Suboccipital release 4x 20 seconds    Grade I-III CPA and UPAs c2-T1 hypomobile throughout with pain relief upon L C4 ; 4x10 seconds each level Thoracic J sweep against thoracic curvature mobilizations grade I-III 5x10 seconds      ULT median nerve glide supine 5x RUE ULT radial nerve glide supine 5x RUE       STM cervical and upper paraspinal; tighter on R cervical and upper thoracic region in seated position with head on table 8 minutes    Pt. response to medical necessity:  Patient will continue to benefit from skilled physical therapy to reduce pain, improve cervical mobility, and allow patient better QOL                         PT Education - 03/22/18 0939    Education provided  Yes    Education Details  goal progression, manual, exercise technique, POC    Person(s) Educated  Patient    Methods  Explanation;Tactile cues;Demonstration    Comprehension  Verbalized understanding;Returned demonstration       PT Short Term Goals - 03/22/18 0933      PT SHORT TERM GOAL #1   Title  Patient will be independent in home exercise program to improve strength/mobility for better functional independence with ADLs.    Baseline  HE Pcompliance    Time  2    Period  Weeks    Status  Achieved      PT SHORT TERM GOAL #2   Title  Patient will report a worst pain of 5/10 on VAS in neck to improve tolerance with ADLs and reduced symptoms with activities.     Baseline  3/4: worst pain 8/10; 4/3: 6/10     Time  2    Period  Weeks    Status  Partially Met        PT Long Term Goals - 03/22/18 7262      PT LONG TERM  GOAL #1   Title  Patient will report a worst pain of 3/10 on VAS in cervical  spine to improve tolerance with ADLs and reduced symptoms with activities    Baseline  worst pain 8/10; 4/3: 6/10     Time  4    Period  Weeks    Status  Partially Met    Target Date  04/19/18      PT LONG TERM GOAL #2   Title  Patient will have no tingling in bilateral hands for two consecutive weeks to improve quality of life.     Baseline   4/3: had no tingling for about a week, came back this week 3x due to husband in the hospital.     Time  4    Period  Weeks    Status  Partially Met    Target Date  04/19/18      PT LONG TERM GOAL #3   Title  Patient will decrease NDI score to 28% to decrease level of perceived disability to minimal.     Baseline  3/4: 46%; 4/3: 44%    Time  4    Period  Weeks    Status  Partially Met    Target Date  04/19/18      PT LONG TERM GOAL #4   Title  Patient will improve cervical rotation ROM to 60 degrees bilaterally to improve functional mobility.     Baseline  3/4: R 39 L 41; 4/3: R 42 , L 46     Time  4    Period  Weeks    Status  Partially Met    Target Date  04/19/18            Plan - 03/22/18 1016    Clinical Impression Statement  Patient progressing towards all goals at this time despite recent regression in symptoms in past week from stress. Cervical ROM improving with increased smoothness of coordination of movement in all directions. NDI=44% and pain is decreasing. R cervical musculature continues to be tighter than left.  Patient will continue to benefit from skilled physical therapy to reduce pain, improve cervical mobility, and allow patient better QOL    Rehab Potential  Fair    Clinical Impairments Affecting Rehab Potential  + CAD testing, history of AAA, anemia, anxiety, asthma, CAD, chronic combined ystolic heart failure, GERD, Marfan syndrome, OA, RA (+) good work Psychologist, forensic, motivated, age     PT Frequency  2x / week    PT Duration  4 weeks    PT  Treatment/Interventions  ADLs/Self Care Home Management;Cryotherapy;Traction;Ultrasound;Therapeutic exercise;Therapeutic activities;Neuromuscular re-education;Patient/family education;Manual techniques;Passive range of motion;Dry needling;Energy conservation;Visual/perceptual remediation/compensation    PT Next Visit Plan  nerve glides, muscle tissue length, posture    PT Home Exercise Plan  give next session.    Consulted and Agree with Plan of Care  Patient       Patient will benefit from skilled therapeutic intervention in order to improve the following deficits and impairments:  Decreased activity tolerance, Decreased safety awareness, Decreased range of motion, Decreased mobility, Decreased strength, Impaired flexibility, Postural dysfunction, Improper body mechanics, Pain  Visit Diagnosis: Radiculopathy, cervical region  Cervicalgia  Abnormal posture     Problem List Patient Active Problem List   Diagnosis Date Noted  . Cough 09/08/2017  . Type 1 dissection of ascending aorta ([place]) 04/23/2017  . HPV (human papilloma virus) infection 12/01/2016  . Screening mammogram, encounter for 11/29/2016  . Estrogen deficiency 11/29/2016  . Encounter for routine gynecological examination 11/29/2016  . Grade III hemorrhoids 10/06/2016  . Tachycardia 08/14/2016  .  Hemorrhoids 08/13/2016  . Atypical migraine 08/03/2016  . CAD (coronary artery disease)   . AAA (abdominal aortic aneurysm) ([place])   . Chronic combined systolic (congestive) and diastolic (congestive) heart failure ([place])   . Hyperglycemia 02/11/2016  . Generalized anxiety disorder 12/08/2015  . Panic attacks 07/28/2015  . Sensorineural hearing loss 03/27/2013  . Vitamin D deficiency 07/03/2012  . H/O aortic dissection 05/12/2012  . Obesity (BMI 30.0-34.9) 11/21/2011  . Routine general medical examination at a health care facility 09/20/2011  . S/P AVR (aortic valve replacement) 01/07/2011  . CORONARY ARTERY BYPASS GRAFT,  HX OF 01/07/2011  . Arthritis 12/20/2010  . H/O dissecting abdominal aortic aneurysm repair 08/24/2010  . IRRITABLE BOWEL SYNDROME 09/09/2009  . Obstructive sleep apnea 08/04/2009  . External hemorrhoid 06/26/2009  . Osteoporosis 01/09/2009  . Hyperlipidemia 07/26/2007  . Depression with anxiety 07/26/2007  . Essential hypertension 07/26/2007  . Allergic rhinitis 07/26/2007  . Asthma 07/26/2007  . GERD 07/26/2007  . Osteoarthritis 07/26/2007  . Ocular migraine 07/26/2007  . Mild intermittent asthma without complication 36/54/2715   Janna Arch, PT, DPT   Janna Arch 03/22/2018, 10:19 AM  [place] MAIN [facility] SERVICES [address] [place], [place], [address] Phone: [phone]   Fax:  [phone]  Name: Rachel Torres MRN: [id number] Date of Birth: 09-23-1956

## 2018-03-27 ENCOUNTER — Ambulatory Visit (INDEPENDENT_AMBULATORY_CARE_PROVIDER_SITE_OTHER): Payer: Medicare HMO | Admitting: Psychology

## 2018-03-27 ENCOUNTER — Ambulatory Visit: Payer: Medicare HMO

## 2018-03-27 DIAGNOSIS — M542 Cervicalgia: Secondary | ICD-10-CM

## 2018-03-27 DIAGNOSIS — F4323 Adjustment disorder with mixed anxiety and depressed mood: Secondary | ICD-10-CM

## 2018-03-27 DIAGNOSIS — M5412 Radiculopathy, cervical region: Secondary | ICD-10-CM

## 2018-03-27 DIAGNOSIS — R293 Abnormal posture: Secondary | ICD-10-CM

## 2018-03-27 NOTE — Therapy (Signed)
[place] MAIN [facility] SERVICES [address] [place], [place], [address] Phone: [phone]   Fax:  [phone]  Physical Therapy Treatment  Patient Details  Name: Rachel Torres MRN: [id number] Date of Birth: [birth year] Referring Provider: Mallie Mussel pool   Encounter Date: 03/27/2018  PT End of Session - 03/27/18 1112    Visit Number  7    Number of Visits  16    Date for PT Re-Evaluation  04/19/18    PT Start Time  0930    PT Stop Time  5009    PT Time Calculation (min)  44 min    Activity Tolerance  Treatment limited secondary to medical complications (Comment);Patient tolerated treatment well    Behavior During Therapy  [facility] for tasks assessed/performed       Past Medical History:  Diagnosis Date  . AAA (abdominal aortic aneurysm) ([place])    a. Chronic w/o evidence of aneurysmal dil on CTA 01/2016.  Marland Kitchen Allergy   . Anemia   . Anxiety   . Asthma   . CAD (coronary artery disease)    a. 08/2010 s/p CABG x 1 (VG->RCA) @ time of Ao dissection repair; b. 01/2016 Lexiscan MV: mid antsept/apical defect w/ ? peri-infarct ischemia-->likely attenuation-->Med Rx.  . Chronic combined systolic (congestive) and diastolic (congestive) heart failure ([place])    a. 2012 EF 30-35%; b. 04/2015 EF 40-45%; c. 01/2016 Echo: Ef 55-65%, nl AoV bioprosthesis, nl RV, nl PASP.  Marland Kitchen Depression   . Gallbladder sludge   . GERD (gastroesophageal reflux disease)   . History of Bicuspid Aortic Valve    a. 08/2010 s/p AVR @ time of Ao dissection repair; b. 01/2016 Echo: Ef 55-65%, nl AoV bioprosthesis, nl RV, nl PASP.  Marland Kitchen Hx of repair of dissecting thoracic aortic aneurysm, Stanford type A    a. 08/2010 s/p repair with AVR and VG->RCA; b. 01/2016 CTA: stable appearance of Asc Thoracic Aortic graft. Opacification of flase lumen of chronic abd Ao dissection w/ retrograde flow through lumbar arteries. No aneurysmal dil of flase lumen.  . Hyperlipidemia   . Hypertensive heart disease   .  IBS (irritable bowel syndrome)   . Internal hemorrhoids   . Marfan syndrome    pt denies  . Migraine   . Obstructive sleep apnea   . Osteoporosis   . Pneumonia   . RA (rheumatoid arthritis) ([place])    a. Followed by Dr. [name]    Past Surgical History:  Procedure Laterality Date  . ABDOMINAL AORTIC ANEURYSM REPAIR    . CARDIAC CATHETERIZATION  2001  . [place]  . CHOLECYSTECTOMY    . COLONOSCOPY    . FRACTURE SURGERY Right    shoulder replacement  . HEMORRHOID BANDING    . ORIF DISTAL RADIUS FRACTURE Left   . UPPER GASTROINTESTINAL ENDOSCOPY      There were no vitals filed for this visit.  Subjective Assessment - 03/27/18 0933    Subjective  Patient having pain from having to lift husband from shower. Pain increased to 5/10 when helping him off the floor.     Pertinent History  Patient is a pleasant 62 year old female who presents with cervical neck pain with radiating symptoms to bilateral fingers. 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  Has high stress levels due to caregiver support. Patient has gained 30 lb.  Has AAA and numbness began seven years ago when aneurysm  occured. Sleeping with a neck pillow to relieve pain. Pain has been occurring for years.     Limitations  Sitting;Lifting;Standing;Walking;Writing;House hold activities;Other (comment);Reading    How long can you sit comfortably?  an hour     How long can you stand comfortably?  n/a    How long can you walk comfortably?  n/a    Diagnostic tests  n/a    Patient Stated Goals  decrease pain.     Currently in Pain?  Yes    Pain Score  2     Pain Location  Neck    Pain Orientation  Posterior    Pain Descriptors / Indicators  Aching;Tightness    Pain Type  Chronic pain    Pain Onset  More than a month ago    Pain Frequency  Constant       Sidebending with overpressure stretch 5x 20 seconds with R more tight than L Rotation l and R with  overpressure stretch 5x20 seconds with R more tight than L.  Suboccipital release 4x 20 seconds    Grade I-III CPA and UPAs c2-T1 hypomobile throughout with pain relief upon L C4 ; 4x10 seconds each level Thoracic J sweep against thoracic curvature mobilizations grade I-III 5x10 seconds      ULT median nerve glide supine 10x RUE ULT radial nerve glide supine 10x RUE     Supine ER 10x GTB ; Min A for elbow placement on RUE    STM cervical and upper paraspinal; tighter on R cervical and upper thoracic region in seated position with head on table 8 minutes    Pt. response to medical necessity:  Patient will continue to benefit from skilled physical therapy to reduce pain, improve cervical mobility, and allow patient better QOL                         PT Education - 03/27/18 0921    Education provided  Yes    Education Details  manual, exercise technique,    Person(s) Educated  Patient    Methods  Explanation;Demonstration;Verbal cues    Comprehension  Verbalized understanding;Returned demonstration       PT Short Term Goals - 03/22/18 0933      PT SHORT TERM GOAL #1   Title  Patient will be independent in home exercise program to improve strength/mobility for better functional independence with ADLs.    Baseline  HE Pcompliance    Time  2    Period  Weeks    Status  Achieved      PT SHORT TERM GOAL #2   Title  Patient will report a worst pain of 5/10 on VAS in neck to improve tolerance with ADLs and reduced symptoms with activities.     Baseline  3/4: worst pain 8/10; 4/3: 6/10     Time  2    Period  Weeks    Status  Partially Met        PT Long Term Goals - 03/22/18 0933      PT LONG TERM GOAL #1   Title  Patient will report a worst pain of 3/10 on VAS in cervical spine to improve tolerance with ADLs and reduced symptoms with activities    Baseline  worst pain 8/10; 4/3: 6/10     Time  4    Period  Weeks    Status  Partially Met    Target Date   04/19/18  PT LONG TERM GOAL #2   Title  Patient will have no tingling in bilateral hands for two consecutive weeks to improve quality of life.     Baseline   4/3: had no tingling for about a week, came back this week 3x due to husband in the hospital.     Time  4    Period  Weeks    Status  Partially Met    Target Date  04/19/18      PT LONG TERM GOAL #3   Title  Patient will decrease NDI score to 28% to decrease level of perceived disability to minimal.     Baseline  3/4: 46%; 4/3: 44%    Time  4    Period  Weeks    Status  Partially Met    Target Date  04/19/18      PT LONG TERM GOAL #4   Title  Patient will improve cervical rotation ROM to 60 degrees bilaterally to improve functional mobility.     Baseline  3/4: R 39 L 41; 4/3: R 42 , L 46     Time  4    Period  Weeks    Status  Partially Met    Target Date  04/19/18            Plan - 03/27/18 1114    Clinical Impression Statement  Patient's muscle tissue length of cervical region is improving in length as with repeated manual interventions. Improved soft tissue length results in improved posture and pain relief.  Patient will continue to benefit from skilled physical therapy to reduce pain, improve cervical mobility, and allow patient better QOL    Rehab Potential  Fair    Clinical Impairments Affecting Rehab Potential  + CAD testing, history of AAA, anemia, anxiety, asthma, CAD, chronic combined ystolic heart failure, GERD, Marfan syndrome, OA, RA (+) good work Psychologist, forensic, motivated, age     PT Frequency  2x / week    PT Duration  4 weeks    PT Treatment/Interventions  ADLs/Self Care Home Management;Cryotherapy;Traction;Ultrasound;Therapeutic exercise;Therapeutic activities;Neuromuscular re-education;Patient/family education;Manual techniques;Passive range of motion;Dry needling;Energy conservation;Visual/perceptual remediation/compensation    PT Next Visit Plan  nerve glides, muscle tissue length, posture    PT Home  Exercise Plan  give next session.    Consulted and Agree with Plan of Care  Patient       Patient will benefit from skilled therapeutic intervention in order to improve the following deficits and impairments:  Decreased activity tolerance, Decreased safety awareness, Decreased range of motion, Decreased mobility, Decreased strength, Impaired flexibility, Postural dysfunction, Improper body mechanics, Pain  Visit Diagnosis: Radiculopathy, cervical region  Cervicalgia  Abnormal posture     Problem List Patient Active Problem List   Diagnosis Date Noted  . Cough 09/08/2017  . Type 1 dissection of ascending aorta ([place]) 04/23/2017  . HPV (human papilloma virus) infection 12/01/2016  . Screening mammogram, encounter for 11/29/2016  . Estrogen deficiency 11/29/2016  . Encounter for routine gynecological examination 11/29/2016  . Grade III hemorrhoids 10/06/2016  . Tachycardia 08/14/2016  . Hemorrhoids 08/13/2016  . Atypical migraine 08/03/2016  . CAD (coronary artery disease)   . AAA (abdominal aortic aneurysm) ([place])   . Chronic combined systolic (congestive) and diastolic (congestive) heart failure ([place])   . Hyperglycemia 02/11/2016  . Generalized anxiety disorder 12/08/2015  . Panic attacks 07/28/2015  . Sensorineural hearing loss 03/27/2013  . Vitamin D deficiency 07/03/2012  . H/O aortic dissection 05/12/2012  .  Obesity (BMI 30.0-34.9) 11/21/2011  . Routine general medical examination at a health care facility 09/20/2011  . S/P AVR (aortic valve replacement) 01/07/2011  . CORONARY ARTERY BYPASS GRAFT, HX OF 01/07/2011  . Arthritis 12/20/2010  . H/O dissecting abdominal aortic aneurysm repair 08/24/2010  . IRRITABLE BOWEL SYNDROME 09/09/2009  . Obstructive sleep apnea 08/04/2009  . External hemorrhoid 06/26/2009  . Osteoporosis 01/09/2009  . Hyperlipidemia 07/26/2007  . Depression with anxiety 07/26/2007  . Essential hypertension 07/26/2007  . Allergic rhinitis  07/26/2007  . Asthma 07/26/2007  . GERD 07/26/2007  . Osteoarthritis 07/26/2007  . Ocular migraine 07/26/2007  . Mild intermittent asthma without complication 69/24/9324   Janna Arch, PT, DPT   Janna Arch 03/27/2018, 11:15 AM  [place] MAIN [facility] SERVICES [address] [place], [place], [address] Phone: [phone]   Fax:  [phone]  Name: Rachel Torres MRN: [id number] Date of Birth: 01-Jan-1956

## 2018-03-29 ENCOUNTER — Ambulatory Visit: Payer: Medicare HMO

## 2018-04-03 ENCOUNTER — Ambulatory Visit: Payer: Medicare HMO

## 2018-04-03 ENCOUNTER — Ambulatory Visit (INDEPENDENT_AMBULATORY_CARE_PROVIDER_SITE_OTHER): Payer: Medicare HMO | Admitting: Psychology

## 2018-04-03 DIAGNOSIS — M542 Cervicalgia: Secondary | ICD-10-CM | POA: Diagnosis not present

## 2018-04-03 DIAGNOSIS — R293 Abnormal posture: Secondary | ICD-10-CM | POA: Diagnosis not present

## 2018-04-03 DIAGNOSIS — F4323 Adjustment disorder with mixed anxiety and depressed mood: Secondary | ICD-10-CM

## 2018-04-03 DIAGNOSIS — M5412 Radiculopathy, cervical region: Secondary | ICD-10-CM

## 2018-04-03 NOTE — Therapy (Signed)
[place] MAIN [facility] SERVICES [address] [place], [place], [address] Phone: [phone]   Fax:  [phone]  Physical Therapy Treatment  Patient Details  Name: Rachel Torres MRN: [id number] Date of Birth: [birth year] Referring Provider: Mallie Mussel pool   Encounter Date: 04/03/2018  PT End of Session - 04/03/18 1026    Visit Number  8    Number of Visits  16    Date for PT Re-Evaluation  04/19/18    PT Start Time  0930    PT Stop Time  1016    PT Time Calculation (min)  46 min    Activity Tolerance  Treatment limited secondary to medical complications (Comment);Patient tolerated treatment well    Behavior During Therapy  [facility] for tasks assessed/performed       Past Medical History:  Diagnosis Date  . AAA (abdominal aortic aneurysm) ([place])    a. Chronic w/o evidence of aneurysmal dil on CTA 01/2016.  Marland Kitchen Allergy   . Anemia   . Anxiety   . Asthma   . CAD (coronary artery disease)    a. 08/2010 s/p CABG x 1 (VG->RCA) @ time of Ao dissection repair; b. 01/2016 Lexiscan MV: mid antsept/apical defect w/ ? peri-infarct ischemia-->likely attenuation-->Med Rx.  . Chronic combined systolic (congestive) and diastolic (congestive) heart failure ([place])    a. 2012 EF 30-35%; b. 04/2015 EF 40-45%; c. 01/2016 Echo: Ef 55-65%, nl AoV bioprosthesis, nl RV, nl PASP.  Marland Kitchen Depression   . Gallbladder sludge   . GERD (gastroesophageal reflux disease)   . History of Bicuspid Aortic Valve    a. 08/2010 s/p AVR @ time of Ao dissection repair; b. 01/2016 Echo: Ef 55-65%, nl AoV bioprosthesis, nl RV, nl PASP.  Marland Kitchen Hx of repair of dissecting thoracic aortic aneurysm, Stanford type A    a. 08/2010 s/p repair with AVR and VG->RCA; b. 01/2016 CTA: stable appearance of Asc Thoracic Aortic graft. Opacification of flase lumen of chronic abd Ao dissection w/ retrograde flow through lumbar arteries. No aneurysmal dil of flase lumen.  . Hyperlipidemia   . Hypertensive heart disease   .  IBS (irritable bowel syndrome)   . Internal hemorrhoids   . Marfan syndrome    pt denies  . Migraine   . Obstructive sleep apnea   . Osteoporosis   . Pneumonia   . RA (rheumatoid arthritis) ([place])    a. Followed by Dr. [name]    Past Surgical History:  Procedure Laterality Date  . ABDOMINAL AORTIC ANEURYSM REPAIR    . CARDIAC CATHETERIZATION  2001  . [place]  . CHOLECYSTECTOMY    . COLONOSCOPY    . FRACTURE SURGERY Right    shoulder replacement  . HEMORRHOID BANDING    . ORIF DISTAL RADIUS FRACTURE Left   . UPPER GASTROINTESTINAL ENDOSCOPY      There were no vitals filed for this visit.  Subjective Assessment - 04/03/18 0933    Subjective  Patient having increased stress due to her mother having a mini stroke and her husband having a serious fall over the weekend.     Pertinent History  Patient is a pleasant 62 year old female who presents with cervical neck pain with radiating symptoms to bilateral fingers. 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  Has high stress levels due to caregiver support. Patient has gained 30 lb.  Has AAA and numbness began seven years ago  when aneurysm occured. Sleeping with a neck pillow to relieve pain. Pain has been occurring for years.     Limitations  Sitting;Lifting;Standing;Walking;Writing;House hold activities;Other (comment);Reading    How long can you sit comfortably?  an hour     How long can you stand comfortably?  n/a    How long can you walk comfortably?  n/a    Diagnostic tests  n/a    Patient Stated Goals  decrease pain.     Currently in Pain?  Yes    Pain Score  3     Pain Location  Neck    Pain Orientation  Posterior    Pain Descriptors / Indicators  Aching    Pain Type  Chronic pain    Pain Onset  More than a month ago    Pain Frequency  Constant       Sidebending with overpressure stretch 5x 20 seconds with R more tight than L Rotation l and R with  overpressure stretch 5x20 seconds with R more tight than L.  Suboccipital release 4x 20 seconds    Grade I-III CPA and UPAs c2-T1 hypomobile throughout with pain relief upon L C4 ; 4x10 seconds each level Thoracic J sweep against thoracic curvature mobilizations grade I-III 5x10 seconds      ULT median nerve glide supine 10x RUE ULT radial nerve glide supine 10x RUE       STM cervical and upper paraspinal; tighter on R cervical and upper thoracic region in seated position with head on table 7 minutes  Trigger point/soft tissue release with movement to improve soft tissue limited muscle tissue length. 4 minutes    Pt. response to medical necessity:  Patient will continue to benefit from skilled physical therapy to reduce pain, improve cervical mobility, and allow patient better QOL                           PT Education - 04/03/18 1025    Education provided  Yes    Education Details  manual, exercise technique    Person(s) Educated  Patient    Methods  Explanation;Demonstration;Verbal cues    Comprehension  Verbalized understanding;Returned demonstration       PT Short Term Goals - 03/22/18 0933      PT SHORT TERM GOAL #1   Title  Patient will be independent in home exercise program to improve strength/mobility for better functional independence with ADLs.    Baseline  HE Pcompliance    Time  2    Period  Weeks    Status  Achieved      PT SHORT TERM GOAL #2   Title  Patient will report a worst pain of 5/10 on VAS in neck to improve tolerance with ADLs and reduced symptoms with activities.     Baseline  3/4: worst pain 8/10; 4/3: 6/10     Time  2    Period  Weeks    Status  Partially Met        PT Long Term Goals - 03/22/18 0933      PT LONG TERM GOAL #1   Title  Patient will report a worst pain of 3/10 on VAS in cervical spine to improve tolerance with ADLs and reduced symptoms with activities    Baseline  worst pain 8/10; 4/3: 6/10     Time   4    Period  Weeks    Status  Partially Met  Target Date  04/19/18      PT LONG TERM GOAL #2   Title  Patient will have no tingling in bilateral hands for two consecutive weeks to improve quality of life.     Baseline   4/3: had no tingling for about a week, came back this week 3x due to husband in the hospital.     Time  4    Period  Weeks    Status  Partially Met    Target Date  04/19/18      PT LONG TERM GOAL #3   Title  Patient will decrease NDI score to 28% to decrease level of perceived disability to minimal.     Baseline  3/4: 46%; 4/3: 44%    Time  4    Period  Weeks    Status  Partially Met    Target Date  04/19/18      PT LONG TERM GOAL #4   Title  Patient will improve cervical rotation ROM to 60 degrees bilaterally to improve functional mobility.     Baseline  3/4: R 39 L 41; 4/3: R 42 , L 46     Time  4    Period  Weeks    Status  Partially Met    Target Date  04/19/18            Plan - 04/03/18 1028    Clinical Impression Statement  Patient demonstrated decreased mobility of spine and increased limitations of soft tissue due to increased stress over the weekend. Pain reduced after manual allowing patient to increase head range of motion and reduce headache.  Patient will continue to benefit from skilled physical therapy to reduce pain, improve cervical mobility, and allow patient better QOL    Rehab Potential  Fair    Clinical Impairments Affecting Rehab Potential  + CAD testing, history of AAA, anemia, anxiety, asthma, CAD, chronic combined ystolic heart failure, GERD, Marfan syndrome, OA, RA (+) good work Psychologist, forensic, motivated, age     PT Frequency  2x / week    PT Duration  4 weeks    PT Treatment/Interventions  ADLs/Self Care Home Management;Cryotherapy;Traction;Ultrasound;Therapeutic exercise;Therapeutic activities;Neuromuscular re-education;Patient/family education;Manual techniques;Passive range of motion;Dry needling;Energy conservation;Visual/perceptual  remediation/compensation    PT Next Visit Plan  nerve glides, muscle tissue length, posture    PT Home Exercise Plan  give next session.    Consulted and Agree with Plan of Care  Patient       Patient will benefit from skilled therapeutic intervention in order to improve the following deficits and impairments:  Decreased activity tolerance, Decreased safety awareness, Decreased range of motion, Decreased mobility, Decreased strength, Impaired flexibility, Postural dysfunction, Improper body mechanics, Pain  Visit Diagnosis: Radiculopathy, cervical region  Cervicalgia  Abnormal posture     Problem List Patient Active Problem List   Diagnosis Date Noted  . Cough 09/08/2017  . Type 1 dissection of ascending aorta ([place]) 04/23/2017  . HPV (human papilloma virus) infection 12/01/2016  . Screening mammogram, encounter for 11/29/2016  . Estrogen deficiency 11/29/2016  . Encounter for routine gynecological examination 11/29/2016  . Grade III hemorrhoids 10/06/2016  . Tachycardia 08/14/2016  . Hemorrhoids 08/13/2016  . Atypical migraine 08/03/2016  . CAD (coronary artery disease)   . AAA (abdominal aortic aneurysm) ([place])   . Chronic combined systolic (congestive) and diastolic (congestive) heart failure ([place])   . Hyperglycemia 02/11/2016  . Generalized anxiety disorder 12/08/2015  . Panic attacks 07/28/2015  . Sensorineural hearing  loss 03/27/2013  . Vitamin D deficiency 07/03/2012  . H/O aortic dissection 05/12/2012  . Obesity (BMI 30.0-34.9) 11/21/2011  . Routine general medical examination at a health care facility 09/20/2011  . S/P AVR (aortic valve replacement) 01/07/2011  . CORONARY ARTERY BYPASS GRAFT, HX OF 01/07/2011  . Arthritis 12/20/2010  . H/O dissecting abdominal aortic aneurysm repair 08/24/2010  . IRRITABLE BOWEL SYNDROME 09/09/2009  . Obstructive sleep apnea 08/04/2009  . External hemorrhoid 06/26/2009  . Osteoporosis 01/09/2009  . Hyperlipidemia 07/26/2007   . Depression with anxiety 07/26/2007  . Essential hypertension 07/26/2007  . Allergic rhinitis 07/26/2007  . Asthma 07/26/2007  . GERD 07/26/2007  . Osteoarthritis 07/26/2007  . Ocular migraine 07/26/2007  . Mild intermittent asthma without complication 31/43/8887   Janna Arch, PT, DPT   04/03/2018, 10:28 AM  [place] MAIN [facility] SERVICES [address] [place], [place], [address] Phone: [phone]   Fax:  [phone]  Name: Rachel Torres MRN: [id number] Date of Birth: 1956/09/03

## 2018-04-05 ENCOUNTER — Ambulatory Visit: Payer: Medicare HMO

## 2018-04-05 ENCOUNTER — Ambulatory Visit (INDEPENDENT_AMBULATORY_CARE_PROVIDER_SITE_OTHER): Payer: Medicare HMO | Admitting: Psychology

## 2018-04-05 DIAGNOSIS — F4323 Adjustment disorder with mixed anxiety and depressed mood: Secondary | ICD-10-CM | POA: Diagnosis not present

## 2018-04-05 DIAGNOSIS — M542 Cervicalgia: Secondary | ICD-10-CM | POA: Diagnosis not present

## 2018-04-05 DIAGNOSIS — R293 Abnormal posture: Secondary | ICD-10-CM

## 2018-04-05 DIAGNOSIS — M5412 Radiculopathy, cervical region: Secondary | ICD-10-CM | POA: Diagnosis not present

## 2018-04-05 NOTE — Therapy (Signed)
[place] MAIN [facility] SERVICES [address] [place], [place], [address] Phone: [phone]   Fax:  [phone]  Physical Therapy Treatment  Patient Details  Name: Rachel Torres MRN: [id number] Date of Birth: [birth year] Referring Provider: Mallie Mussel pool   Encounter Date: 04/05/2018  PT End of Session - 04/05/18 1016    Visit Number  9    Number of Visits  16    Date for PT Re-Evaluation  04/19/18    PT Start Time  0930    PT Stop Time  9381    PT Time Calculation (min)  44 min    Activity Tolerance  Treatment limited secondary to medical complications (Comment);Patient tolerated treatment well    Behavior During Therapy  [facility] for tasks assessed/performed       Past Medical History:  Diagnosis Date  . AAA (abdominal aortic aneurysm) ([place])    a. Chronic w/o evidence of aneurysmal dil on CTA 01/2016.  Marland Kitchen Allergy   . Anemia   . Anxiety   . Asthma   . CAD (coronary artery disease)    a. 08/2010 s/p CABG x 1 (VG->RCA) @ time of Ao dissection repair; b. 01/2016 Lexiscan MV: mid antsept/apical defect w/ ? peri-infarct ischemia-->likely attenuation-->Med Rx.  . Chronic combined systolic (congestive) and diastolic (congestive) heart failure ([place])    a. 2012 EF 30-35%; b. 04/2015 EF 40-45%; c. 01/2016 Echo: Ef 55-65%, nl AoV bioprosthesis, nl RV, nl PASP.  Marland Kitchen Depression   . Gallbladder sludge   . GERD (gastroesophageal reflux disease)   . History of Bicuspid Aortic Valve    a. 08/2010 s/p AVR @ time of Ao dissection repair; b. 01/2016 Echo: Ef 55-65%, nl AoV bioprosthesis, nl RV, nl PASP.  Marland Kitchen Hx of repair of dissecting thoracic aortic aneurysm, Stanford type A    a. 08/2010 s/p repair with AVR and VG->RCA; b. 01/2016 CTA: stable appearance of Asc Thoracic Aortic graft. Opacification of flase lumen of chronic abd Ao dissection w/ retrograde flow through lumbar arteries. No aneurysmal dil of flase lumen.  . Hyperlipidemia   . Hypertensive heart disease   .  IBS (irritable bowel syndrome)   . Internal hemorrhoids   . Marfan syndrome    pt denies  . Migraine   . Obstructive sleep apnea   . Osteoporosis   . Pneumonia   . RA (rheumatoid arthritis) ([place])    a. Followed by Dr. [name]    Past Surgical History:  Procedure Laterality Date  . ABDOMINAL AORTIC ANEURYSM REPAIR    . CARDIAC CATHETERIZATION  2001  . [place]  . CHOLECYSTECTOMY    . COLONOSCOPY    . FRACTURE SURGERY Right    shoulder replacement  . HEMORRHOID BANDING    . ORIF DISTAL RADIUS FRACTURE Left   . UPPER GASTROINTESTINAL ENDOSCOPY      There were no vitals filed for this visit.  Subjective Assessment - 04/05/18 0933    Subjective  Patient neck pain is doing better. Reports having one headache since last session. HEP compliance.     Pertinent History  Patient is a pleasant 62 year old female who presents with cervical neck pain with radiating symptoms to bilateral fingers. 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  Has high stress levels due to caregiver support. Patient has gained 30 lb.  Has AAA and numbness began seven years ago when aneurysm occured. Sleeping with a neck  pillow to relieve pain. Pain has been occurring for years.     Limitations  Sitting;Lifting;Standing;Walking;Writing;House hold activities;Other (comment);Reading    How long can you sit comfortably?  an hour     How long can you stand comfortably?  n/a    How long can you walk comfortably?  n/a    Diagnostic tests  n/a    Patient Stated Goals  decrease pain.     Currently in Pain?  Yes    Pain Score  1     Pain Location  Neck    Pain Orientation  Posterior    Pain Descriptors / Indicators  Aching    Pain Type  Chronic pain    Pain Onset  More than a month ago    Pain Frequency  Constant       Sidebending with overpressure stretch 5x 20 seconds with R more tight than L Rotation l and R with overpressure stretch 5x20 seconds  with R more tight than L.  Suboccipital release 4x 20 seconds    Grade I-III CPA and UPAs c2-T1 hypomobile throughout with pain relief upon L C4 ; 4x10 seconds each level Thoracic J sweep against thoracic curvature mobilizations grade I-III 5x10 seconds      ULT median nerve glide supine 10x RUE ULT radial nerve glide supine 10x RUE       STM cervical and upper paraspinal; tighter on R cervical and upper thoracic region in seated position with head on table 7 minutes   Trigger point/soft tissue release with movement to improve soft tissue limited muscle tissue length. 4 minutes    Pt. response to medical necessity:  Patient will continue to benefit from skilled physical therapy to reduce pain, improve cervical mobility, and allow patient better QOL                         PT Education - 04/05/18 1016    Education provided  Yes    Education Details  manual, posture    Person(s) Educated  Patient    Methods  Explanation;Demonstration;Verbal cues    Comprehension  Verbalized understanding;Returned demonstration       PT Short Term Goals - 03/22/18 0933      PT SHORT TERM GOAL #1   Title  Patient will be independent in home exercise program to improve strength/mobility for better functional independence with ADLs.    Baseline  HE Pcompliance    Time  2    Period  Weeks    Status  Achieved      PT SHORT TERM GOAL #2   Title  Patient will report a worst pain of 5/10 on VAS in neck to improve tolerance with ADLs and reduced symptoms with activities.     Baseline  3/4: worst pain 8/10; 4/3: 6/10     Time  2    Period  Weeks    Status  Partially Met        PT Long Term Goals - 03/22/18 0933      PT LONG TERM GOAL #1   Title  Patient will report a worst pain of 3/10 on VAS in cervical spine to improve tolerance with ADLs and reduced symptoms with activities    Baseline  worst pain 8/10; 4/3: 6/10     Time  4    Period  Weeks    Status  Partially Met     Target Date  04/19/18  PT LONG TERM GOAL #2   Title  Patient will have no tingling in bilateral hands for two consecutive weeks to improve quality of life.     Baseline   4/3: had no tingling for about a week, came back this week 3x due to husband in the hospital.     Time  4    Period  Weeks    Status  Partially Met    Target Date  04/19/18      PT LONG TERM GOAL #3   Title  Patient will decrease NDI score to 28% to decrease level of perceived disability to minimal.     Baseline  3/4: 46%; 4/3: 44%    Time  4    Period  Weeks    Status  Partially Met    Target Date  04/19/18      PT LONG TERM GOAL #4   Title  Patient will improve cervical rotation ROM to 60 degrees bilaterally to improve functional mobility.     Baseline  3/4: R 39 L 41; 4/3: R 42 , L 46     Time  4    Period  Weeks    Status  Partially Met    Target Date  04/19/18            Plan - 04/05/18 1018    Clinical Impression Statement  Patient demonstrated improved mobility of cervicothoracic spine and improved muscle tension length allowing for increased depth of STM/ trigger point. Patient presents with increased tenderness upon superiomedial borders of bilateral scapula that result in released tissue length upon repetition. Patient will continue to benefit from skilled physical therapy to reduce pain, improve cervical mobility, and allow patient better QOL    Rehab Potential  Fair    Clinical Impairments Affecting Rehab Potential  + CAD testing, history of AAA, anemia, anxiety, asthma, CAD, chronic combined ystolic heart failure, GERD, Marfan syndrome, OA, RA (+) good work Psychologist, forensic, motivated, age     PT Frequency  2x / week    PT Duration  4 weeks    PT Treatment/Interventions  ADLs/Self Care Home Management;Cryotherapy;Traction;Ultrasound;Therapeutic exercise;Therapeutic activities;Neuromuscular re-education;Patient/family education;Manual techniques;Passive range of motion;Dry needling;Energy  conservation;Visual/perceptual remediation/compensation    PT Next Visit Plan  nerve glides, muscle tissue length, posture    PT Home Exercise Plan  give next session.    Consulted and Agree with Plan of Care  Patient       Patient will benefit from skilled therapeutic intervention in order to improve the following deficits and impairments:  Decreased activity tolerance, Decreased safety awareness, Decreased range of motion, Decreased mobility, Decreased strength, Impaired flexibility, Postural dysfunction, Improper body mechanics, Pain  Visit Diagnosis: Radiculopathy, cervical region  Cervicalgia  Abnormal posture     Problem List Patient Active Problem List   Diagnosis Date Noted  . Cough 09/08/2017  . Type 1 dissection of ascending aorta ([place]) 04/23/2017  . HPV (human papilloma virus) infection 12/01/2016  . Screening mammogram, encounter for 11/29/2016  . Estrogen deficiency 11/29/2016  . Encounter for routine gynecological examination 11/29/2016  . Grade III hemorrhoids 10/06/2016  . Tachycardia 08/14/2016  . Hemorrhoids 08/13/2016  . Atypical migraine 08/03/2016  . CAD (coronary artery disease)   . AAA (abdominal aortic aneurysm) ([place])   . Chronic combined systolic (congestive) and diastolic (congestive) heart failure ([place])   . Hyperglycemia 02/11/2016  . Generalized anxiety disorder 12/08/2015  . Panic attacks 07/28/2015  . Sensorineural hearing loss 03/27/2013  . Vitamin  D deficiency 07/03/2012  . H/O aortic dissection 05/12/2012  . Obesity (BMI 30.0-34.9) 11/21/2011  . Routine general medical examination at a health care facility 09/20/2011  . S/P AVR (aortic valve replacement) 01/07/2011  . CORONARY ARTERY BYPASS GRAFT, HX OF 01/07/2011  . Arthritis 12/20/2010  . H/O dissecting abdominal aortic aneurysm repair 08/24/2010  . IRRITABLE BOWEL SYNDROME 09/09/2009  . Obstructive sleep apnea 08/04/2009  . External hemorrhoid 06/26/2009  . Osteoporosis 01/09/2009   . Hyperlipidemia 07/26/2007  . Depression with anxiety 07/26/2007  . Essential hypertension 07/26/2007  . Allergic rhinitis 07/26/2007  . Asthma 07/26/2007  . GERD 07/26/2007  . Osteoarthritis 07/26/2007  . Ocular migraine 07/26/2007  . Mild intermittent asthma without complication 40/07/6760  Janna Arch, PT, DPT    04/05/2018, 10:19 AM  [place] MAIN [facility] SERVICES [address] [place], [place], [address] Phone: [phone]   Fax:  [phone]  Name: ARYNN ARMAND MRN: [id number] Date of Birth: November 18, 1956

## 2018-04-10 ENCOUNTER — Ambulatory Visit (INDEPENDENT_AMBULATORY_CARE_PROVIDER_SITE_OTHER): Payer: Medicare HMO | Admitting: Psychology

## 2018-04-10 DIAGNOSIS — F4323 Adjustment disorder with mixed anxiety and depressed mood: Secondary | ICD-10-CM | POA: Diagnosis not present

## 2018-04-11 ENCOUNTER — Ambulatory Visit: Payer: Medicare HMO

## 2018-04-11 DIAGNOSIS — M5412 Radiculopathy, cervical region: Secondary | ICD-10-CM

## 2018-04-11 DIAGNOSIS — M542 Cervicalgia: Secondary | ICD-10-CM | POA: Diagnosis not present

## 2018-04-11 DIAGNOSIS — R293 Abnormal posture: Secondary | ICD-10-CM | POA: Diagnosis not present

## 2018-04-11 NOTE — Therapy (Signed)
[place] MAIN [facility] SERVICES [address] [place], [place], [address] Phone: [phone]   Fax:  [phone]  Physical Therapy Treatment  Patient Details  Name: Rachel Torres MRN: [id number] Date of Birth: 04/22/1956 Referring Provider: Mallie Mussel pool   Encounter Date: 04/11/2018  PT End of Session - 04/11/18 0850    Visit Number  10    Number of Visits  16    Date for PT Re-Evaluation  04/19/18    PT Start Time  0800    PT Stop Time  4680    PT Time Calculation (min)  44 min    Activity Tolerance  Treatment limited secondary to medical complications (Comment);Patient tolerated treatment well    Behavior During Therapy  [facility] for tasks assessed/performed       Past Medical History:  Diagnosis Date  . AAA (abdominal aortic aneurysm) ([place])    a. Chronic w/o evidence of aneurysmal dil on CTA 01/2016.  Marland Kitchen Allergy   . Anemia   . Anxiety   . Asthma   . CAD (coronary artery disease)    a. 08/2010 s/p CABG x 1 (VG->RCA) @ time of Ao dissection repair; b. 01/2016 Lexiscan MV: mid antsept/apical defect w/ ? peri-infarct ischemia-->likely attenuation-->Med Rx.  . Chronic combined systolic (congestive) and diastolic (congestive) heart failure ([place])    a. 2012 EF 30-35%; b. 04/2015 EF 40-45%; c. 01/2016 Echo: Ef 55-65%, nl AoV bioprosthesis, nl RV, nl PASP.  Marland Kitchen Depression   . Gallbladder sludge   . GERD (gastroesophageal reflux disease)   . History of Bicuspid Aortic Valve    a. 08/2010 s/p AVR @ time of Ao dissection repair; b. 01/2016 Echo: Ef 55-65%, nl AoV bioprosthesis, nl RV, nl PASP.  Marland Kitchen Hx of repair of dissecting thoracic aortic aneurysm, Stanford type A    a. 08/2010 s/p repair with AVR and VG->RCA; b. 01/2016 CTA: stable appearance of Asc Thoracic Aortic graft. Opacification of flase lumen of chronic abd Ao dissection w/ retrograde flow through lumbar arteries. No aneurysmal dil of flase lumen.  . Hyperlipidemia   . Hypertensive heart disease   .  IBS (irritable bowel syndrome)   . Internal hemorrhoids   . Marfan syndrome    pt denies  . Migraine   . Obstructive sleep apnea   . Osteoporosis   . Pneumonia   . RA (rheumatoid arthritis) ([place])    a. Followed by Dr. [name]    Past Surgical History:  Procedure Laterality Date  . ABDOMINAL AORTIC ANEURYSM REPAIR    . CARDIAC CATHETERIZATION  2001  . [place]  . CHOLECYSTECTOMY    . COLONOSCOPY    . FRACTURE SURGERY Right    shoulder replacement  . HEMORRHOID BANDING    . ORIF DISTAL RADIUS FRACTURE Left   . UPPER GASTROINTESTINAL ENDOSCOPY      There were no vitals filed for this visit.  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 Saw therapist yesterday.     Pertinent History  Patient is a pleasant 62 year old female who presents with cervical neck pain with radiating symptoms to bilateral fingers. 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  Has high stress levels due to caregiver support. Patient has gained 30 lb.  Has AAA and numbness began seven years ago when aneurysm occured. Sleeping with  a neck pillow to relieve pain. Pain has been occurring for years.     Limitations  Sitting;Lifting;Standing;Walking;Writing;House hold activities;Other (comment);Reading    How long can you sit comfortably?  an hour     How long can you stand comfortably?  n/a    How long can you walk comfortably?  n/a    Diagnostic tests  n/a    Patient Stated Goals  decrease pain.     Currently in Pain?  Yes    Pain Score  3     Pain Location  Neck    Pain Orientation  Posterior    Pain Descriptors / Indicators  Aching    Pain Type  Chronic pain    Pain Onset  More than a month ago    Pain Frequency  Constant       Sidebending with overpressure stretch 5x 20 seconds with R more tight than L Rotation l and R with overpressure stretch  5x20 seconds with R more tight than L.  Suboccipital release 4x 20 seconds    Grade I-III CPA and UPAs c2-T1 hypomobile throughout with pain relief upon L C4 ; 4x10 seconds each level Thoracic J sweep against thoracic curvature mobilizations grade I-III 5x10 seconds      ULT median nerve glide supine 10x RUE ULT radial nerve glide supine 10x RUE       STM cervical and upper paraspinal; tighter on R cervical and upper thoracic region in seated position with head on table 7 minutes   Trigger point/soft tissue release with movement to improve soft tissue limited muscle tissue length. 5 minutes    Pt. response to medical necessity:  Patient will continue to benefit from skilled physical therapy to reduce pain, improve cervical mobility, and allow patient better QOL                          PT Education - 04/11/18 0846    Education provided  Yes    Education Details  manual, posture     Person(s) Educated  Patient    Methods  Explanation;Demonstration;Verbal cues    Comprehension  Verbalized understanding;Returned demonstration       PT Short Term Goals - 03/22/18 0933      PT SHORT TERM GOAL #1   Title  Patient will be independent in home exercise program to improve strength/mobility for better functional independence with ADLs.    Baseline  HE Pcompliance    Time  2    Period  Weeks    Status  Achieved      PT SHORT TERM GOAL #2   Title  Patient will report a worst pain of 5/10 on VAS in neck to improve tolerance with ADLs and reduced symptoms with activities.     Baseline  3/4: worst pain 8/10; 4/3: 6/10     Time  2    Period  Weeks    Status  Partially Met        PT Long Term Goals - 03/22/18 0933      PT LONG TERM GOAL #1   Title  Patient will report a worst pain of 3/10 on VAS in cervical spine to improve tolerance with ADLs and reduced symptoms with activities    Baseline  worst pain 8/10; 4/3: 6/10     Time  4    Period  Weeks    Status   Partially Met    Target Date  04/19/18      PT LONG TERM GOAL #2   Title  Patient will have no tingling in bilateral hands for two consecutive weeks to improve quality of life.     Baseline   4/3: had no tingling for about a week, came back this week 3x due to husband in the hospital.     Time  4    Period  Weeks    Status  Partially Met    Target Date  04/19/18      PT LONG TERM GOAL #3   Title  Patient will decrease NDI score to 28% to decrease level of perceived disability to minimal.     Baseline  3/4: 46%; 4/3: 44%    Time  4    Period  Weeks    Status  Partially Met    Target Date  04/19/18      PT LONG TERM GOAL #4   Title  Patient will improve cervical rotation ROM to 60 degrees bilaterally to improve functional mobility.     Baseline  3/4: R 39 L 41; 4/3: R 42 , L 46     Time  4    Period  Weeks    Status  Partially Met    Target Date  04/19/18            Plan - 04/11/18 0851    Clinical Impression Statement  Patient demonstrated muscle tension focalizing upon posterior cervical anterior portion and insertion of superomedial scapula. Pain reduced from 3/10 to 1/10 by end of session.  Patient will continue to benefit from skilled physical therapy to reduce pain, improve cervical mobility, and allow patient better QOL    Rehab Potential  Fair    Clinical Impairments Affecting Rehab Potential  + CAD testing, history of AAA, anemia, anxiety, asthma, CAD, chronic combined ystolic heart failure, GERD, Marfan syndrome, OA, RA (+) good work Psychologist, forensic, motivated, age     PT Frequency  2x / week    PT Duration  4 weeks    PT Treatment/Interventions  ADLs/Self Care Home Management;Cryotherapy;Traction;Ultrasound;Therapeutic exercise;Therapeutic activities;Neuromuscular re-education;Patient/family education;Manual techniques;Passive range of motion;Dry needling;Energy conservation;Visual/perceptual remediation/compensation    PT Next Visit Plan  nerve glides, muscle tissue length,  posture    PT Home Exercise Plan  give next session.    Consulted and Agree with Plan of Care  Patient       Patient will benefit from skilled therapeutic intervention in order to improve the following deficits and impairments:  Decreased activity tolerance, Decreased safety awareness, Decreased range of motion, Decreased mobility, Decreased strength, Impaired flexibility, Postural dysfunction, Improper body mechanics, Pain  Visit Diagnosis: Radiculopathy, cervical region  Cervicalgia  Abnormal posture     Problem List Patient Active Problem List   Diagnosis Date Noted  . Cough 09/08/2017  . Type 1 dissection of ascending aorta ([place]) 04/23/2017  . HPV (human papilloma virus) infection 12/01/2016  . Screening mammogram, encounter for 11/29/2016  . Estrogen deficiency 11/29/2016  . Encounter for routine gynecological examination 11/29/2016  . Grade III hemorrhoids 10/06/2016  . Tachycardia 08/14/2016  . Hemorrhoids 08/13/2016  . Atypical migraine 08/03/2016  . CAD (coronary artery disease)   . AAA (abdominal aortic aneurysm) ([place])   . Chronic combined systolic (congestive) and diastolic (congestive) heart failure ([place])   . Hyperglycemia 02/11/2016  . Generalized anxiety disorder 12/08/2015  . Panic attacks 07/28/2015  . Sensorineural hearing loss 03/27/2013  . Vitamin D deficiency 07/03/2012  . H/O aortic  dissection 05/12/2012  . Obesity (BMI 30.0-34.9) 11/21/2011  . Routine general medical examination at a health care facility 09/20/2011  . S/P AVR (aortic valve replacement) 01/07/2011  . CORONARY ARTERY BYPASS GRAFT, HX OF 01/07/2011  . Arthritis 12/20/2010  . H/O dissecting abdominal aortic aneurysm repair 08/24/2010  . IRRITABLE BOWEL SYNDROME 09/09/2009  . Obstructive sleep apnea 08/04/2009  . External hemorrhoid 06/26/2009  . Osteoporosis 01/09/2009  . Hyperlipidemia 07/26/2007  . Depression with anxiety 07/26/2007  . Essential hypertension 07/26/2007  .  Allergic rhinitis 07/26/2007  . Asthma 07/26/2007  . GERD 07/26/2007  . Osteoarthritis 07/26/2007  . Ocular migraine 07/26/2007  . Mild intermittent asthma without complication 43/53/9122   Janna Arch, PT, DPT   04/11/2018, 8:53 AM  [place] MAIN [facility] SERVICES [address] [place], [place], [address] Phone: [phone]   Fax:  [phone]  Name: Rachel Torres MRN: [id number] Date of Birth: [birth year]

## 2018-04-17 DIAGNOSIS — F3340 Major depressive disorder, recurrent, in remission, unspecified: Secondary | ICD-10-CM | POA: Diagnosis not present

## 2018-04-17 DIAGNOSIS — G4733 Obstructive sleep apnea (adult) (pediatric): Secondary | ICD-10-CM | POA: Diagnosis not present

## 2018-04-17 DIAGNOSIS — E669 Obesity, unspecified: Secondary | ICD-10-CM | POA: Diagnosis not present

## 2018-04-17 DIAGNOSIS — E876 Hypokalemia: Secondary | ICD-10-CM | POA: Diagnosis not present

## 2018-04-17 DIAGNOSIS — G43909 Migraine, unspecified, not intractable, without status migrainosus: Secondary | ICD-10-CM | POA: Diagnosis not present

## 2018-04-17 DIAGNOSIS — E785 Hyperlipidemia, unspecified: Secondary | ICD-10-CM | POA: Diagnosis not present

## 2018-04-17 DIAGNOSIS — F418 Other specified anxiety disorders: Secondary | ICD-10-CM | POA: Diagnosis not present

## 2018-04-17 DIAGNOSIS — I10 Essential (primary) hypertension: Secondary | ICD-10-CM | POA: Diagnosis not present

## 2018-04-17 DIAGNOSIS — I509 Heart failure, unspecified: Secondary | ICD-10-CM | POA: Diagnosis not present

## 2018-04-18 ENCOUNTER — Other Ambulatory Visit: Payer: Self-pay

## 2018-04-18 ENCOUNTER — Ambulatory Visit: Payer: Medicare HMO

## 2018-04-18 ENCOUNTER — Ambulatory Visit (INDEPENDENT_AMBULATORY_CARE_PROVIDER_SITE_OTHER): Payer: Medicare HMO | Admitting: Psychology

## 2018-04-18 DIAGNOSIS — M542 Cervicalgia: Secondary | ICD-10-CM

## 2018-04-18 DIAGNOSIS — M5412 Radiculopathy, cervical region: Secondary | ICD-10-CM

## 2018-04-18 DIAGNOSIS — F4323 Adjustment disorder with mixed anxiety and depressed mood: Secondary | ICD-10-CM | POA: Diagnosis not present

## 2018-04-18 DIAGNOSIS — R293 Abnormal posture: Secondary | ICD-10-CM | POA: Diagnosis not present

## 2018-04-18 MED ORDER — ATORVASTATIN CALCIUM 20 MG PO TABS: 20.0000 mg | ORAL_TABLET | Freq: Every day | ORAL | 0 refills | Status: DC

## 2018-04-18 MED ORDER — FUROSEMIDE 20 MG PO TABS: 20.0000 mg | ORAL_TABLET | Freq: Every day | ORAL | 1 refills | Status: DC

## 2018-04-18 MED ORDER — LISINOPRIL 5 MG PO TABS: ORAL_TABLET | ORAL | 3 refills | Status: DC

## 2018-04-18 NOTE — Therapy (Signed)
[place] MAIN [facility] SERVICES [address] [place], [place], [address] Phone: [phone]   Fax:  [phone]  Physical Therapy Treatment  Patient Details  Name: Rachel Torres MRN: [id number] Date of Birth: 04/07/1956 Referring Provider: Mallie Mussel pool   Encounter Date: 04/18/2018  PT End of Session - 04/18/18 0910    Visit Number  11    Number of Visits  19    Date for PT Re-Evaluation  05/16/18    PT Start Time  0900    PT Stop Time  0946    PT Time Calculation (min)  46 min    Activity Tolerance  Treatment limited secondary to medical complications (Comment);Patient tolerated treatment well    Behavior During Therapy  [facility] for tasks assessed/performed       Past Medical History:  Diagnosis Date  . AAA (abdominal aortic aneurysm) ([place])    a. Chronic w/o evidence of aneurysmal dil on CTA 01/2016.  Marland Kitchen Allergy   . Anemia   . Anxiety   . Asthma   . CAD (coronary artery disease)    a. 08/2010 s/p CABG x 1 (VG->RCA) @ time of Ao dissection repair; b. 01/2016 Lexiscan MV: mid antsept/apical defect w/ ? peri-infarct ischemia-->likely attenuation-->Med Rx.  . Chronic combined systolic (congestive) and diastolic (congestive) heart failure ([place])    a. 2012 EF 30-35%; b. 04/2015 EF 40-45%; c. 01/2016 Echo: Ef 55-65%, nl AoV bioprosthesis, nl RV, nl PASP.  Marland Kitchen Depression   . Gallbladder sludge   . GERD (gastroesophageal reflux disease)   . History of Bicuspid Aortic Valve    a. 08/2010 s/p AVR @ time of Ao dissection repair; b. 01/2016 Echo: Ef 55-65%, nl AoV bioprosthesis, nl RV, nl PASP.  Marland Kitchen Hx of repair of dissecting thoracic aortic aneurysm, Stanford type A    a. 08/2010 s/p repair with AVR and VG->RCA; b. 01/2016 CTA: stable appearance of Asc Thoracic Aortic graft. Opacification of flase lumen of chronic abd Ao dissection w/ retrograde flow through lumbar arteries. No aneurysmal dil of flase lumen.  . Hyperlipidemia   . Hypertensive heart disease   .  IBS (irritable bowel syndrome)   . Internal hemorrhoids   . Marfan syndrome    pt denies  . Migraine   . Obstructive sleep apnea   . Osteoporosis   . Pneumonia   . RA (rheumatoid arthritis) ([place])    a. Followed by Dr. [name]    Past Surgical History:  Procedure Laterality Date  . ABDOMINAL AORTIC ANEURYSM REPAIR    . CARDIAC CATHETERIZATION  2001  . [place]  . CHOLECYSTECTOMY    . COLONOSCOPY    . FRACTURE SURGERY Right    shoulder replacement  . HEMORRHOID BANDING    . ORIF DISTAL RADIUS FRACTURE Left   . UPPER GASTROINTESTINAL ENDOSCOPY      There were no vitals filed for this visit.  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 However feels as if neck musculature is improving when not having a flair up from caregiver duties.     Pertinent History  Patient is a pleasant 62 year old female who presents with cervical neck pain with radiating symptoms to bilateral fingers. 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  Has high stress levels due to caregiver support. Patient  has gained 30 lb.  Has AAA and numbness began seven years ago when aneurysm occured. Sleeping with a neck pillow to relieve pain. Pain has been occurring for years.     Limitations  Sitting;Lifting;Standing;Walking;Writing;House hold activities;Other (comment);Reading    How long can you sit comfortably?  an hour     How long can you stand comfortably?  n/a    How long can you walk comfortably?  n/a    Diagnostic tests  n/a    Patient Stated Goals  decrease pain.     Currently in Pain?  Yes    Pain Score  4     Pain Location  Neck    Pain Orientation  Posterior    Pain Descriptors / Indicators  Aching    Pain Type  Chronic pain    Pain Onset  More than a month ago    Pain Frequency  Constant       Recert  VAS:  worst pain 7/10 tension headache from caregiver  Average pain 2/10  NDI: 54%  Cervical rotation to 60 degrees To L: 55 R 54 supine To L 48 R 45 seated    Suboccipital release 4x 20 seconds   Grade I-III CPA and UPAs c2-T1hypomobile throughout with pain relief upon L C4; 4x10 seconds each level Thoracic J sweep against thoracic curvature mobilizations grade I-III 5x10 seconds    STM cervical and upper paraspinal; tighter on R cervical and upper thoracic regionin supine position 58mnutes  Trigger point/soft tissue release with movement to improve soft tissue limited muscle tissue length. 5 minutes  Single arm row OTB for HEP addition 10x each arm  Standing wall posture 10x 5 seconds   Pt. response to medical necessity: Patient will continue to benefit from skilled physical therapy to reduce pain, improve cervical mobility, and allow patient better QOL                     PT Education - 04/18/18 0910    Education provided  Yes    Education Details  manual, posture, POC, add single arm row and wall posture to HEP    Person(s) Educated  Patient    Methods  Explanation;Demonstration;Verbal cues;Tactile cues    Comprehension  Verbalized understanding;Returned demonstration;Need further instruction       PT Short Term Goals - 04/18/18 0903      PT SHORT TERM GOAL #1   Title  Patient will be independent in home exercise program to improve strength/mobility for better functional independence with ADLs.    Baseline  HEP compliance    Time  2    Period  Weeks    Status  Achieved      PT SHORT TERM GOAL #2   Title  Patient will report a worst pain of 5/10 on VAS in neck to improve tolerance with ADLs and reduced symptoms with activities.     Baseline  3/4: worst pain 8/10; 4/3: 6/10 4/30: 7/10 tension headache     Time  2    Period  Weeks    Status  Partially Met        PT Long Term Goals - 04/18/18 0904      PT LONG TERM GOAL #1   Title   Patient will report a worst pain of 3/10 on VAS in cervical spine to improve tolerance with ADLs and reduced symptoms with activities    Baseline  worst pain 8/10; 4/3: 6/10 4/30: 7/10 tension headache  from caregiver.     Time  4    Period  Weeks    Status  Partially Met    Target Date  05/16/18      PT LONG TERM GOAL #2   Title  Patient will have no tingling in bilateral hands for two consecutive weeks to improve quality of life.     Baseline   4/3: had no tingling for about a week, came back this week 3x due to husband in the hospital. 4/30: 2x a week.     Time  4    Period  Weeks    Status  Partially Met    Target Date  05/16/18      PT LONG TERM GOAL #3   Title  Patient will decrease NDI score to 28% to decrease level of perceived disability to minimal.     Baseline  3/4: 46%; 4/3: 44%: 4/30: 54%    Time  4    Period  Weeks    Status  Partially Met    Target Date  05/16/18      PT LONG TERM GOAL #4   Title  Patient will improve cervical rotation ROM to 60 degrees bilaterally to improve functional mobility.     Baseline  3/4: R 39 L 41; 4/3: R 42 , L 46 4/30: supine L55 R 54     Time  4    Period  Weeks    Status  Partially Met    Target Date  05/16/18            Plan - 04/18/18 1004    Clinical Impression Statement  Patient progressing with increased ROM of cervical rotation. Pain scales are affected due caregiver duties resulting in excessive strain of neck and increased headache from tension. Muscle tension focalizing on posterior cervical superior portion and insertion of superomedial scapula. Posture is improving with reduction of rounded shoulders. Patient will continue to benefit from skilled physical therapy to reduce pain, improve cervical mobility, and allow patient better QOL    Rehab Potential  Fair    Clinical Impairments Affecting Rehab Potential  + CAD testing, history of AAA, anemia, anxiety, asthma, CAD, chronic combined ystolic heart failure, GERD,  Marfan syndrome, OA, RA (+) good work Psychologist, forensic, motivated, age     PT Frequency  2x / week    PT Duration  4 weeks    PT Treatment/Interventions  ADLs/Self Care Home Management;Cryotherapy;Traction;Ultrasound;Therapeutic exercise;Therapeutic activities;Neuromuscular re-education;Patient/family education;Manual techniques;Passive range of motion;Dry needling;Energy conservation;Visual/perceptual remediation/compensation    PT Next Visit Plan  nerve glides, muscle tissue length, posture    PT Home Exercise Plan  give next session.    Consulted and Agree with Plan of Care  Patient       Patient will benefit from skilled therapeutic intervention in order to improve the following deficits and impairments:  Decreased activity tolerance, Decreased safety awareness, Decreased range of motion, Decreased mobility, Decreased strength, Impaired flexibility, Postural dysfunction, Improper body mechanics, Pain  Visit Diagnosis: Radiculopathy, cervical region  Cervicalgia  Abnormal posture     Problem List Patient Active Problem List   Diagnosis Date Noted  . Cough 09/08/2017  . Type 1 dissection of ascending aorta ([place]) 04/23/2017  . HPV (human papilloma virus) infection 12/01/2016  . Screening mammogram, encounter for 11/29/2016  . Estrogen deficiency 11/29/2016  . Encounter for routine gynecological examination 11/29/2016  . Grade III hemorrhoids 10/06/2016  . Tachycardia 08/14/2016  . Hemorrhoids 08/13/2016  . Atypical migraine  08/03/2016  . CAD (coronary artery disease)   . AAA (abdominal aortic aneurysm) ([place])   . Chronic combined systolic (congestive) and diastolic (congestive) heart failure ([place])   . Hyperglycemia 02/11/2016  . Generalized anxiety disorder 12/08/2015  . Panic attacks 07/28/2015  . Sensorineural hearing loss 03/27/2013  . Vitamin D deficiency 07/03/2012  . H/O aortic dissection 05/12/2012  . Obesity (BMI 30.0-34.9) 11/21/2011  . Routine general medical examination  at a health care facility 09/20/2011  . S/P AVR (aortic valve replacement) 01/07/2011  . CORONARY ARTERY BYPASS GRAFT, HX OF 01/07/2011  . Arthritis 12/20/2010  . H/O dissecting abdominal aortic aneurysm repair 08/24/2010  . IRRITABLE BOWEL SYNDROME 09/09/2009  . Obstructive sleep apnea 08/04/2009  . External hemorrhoid 06/26/2009  . Osteoporosis 01/09/2009  . Hyperlipidemia 07/26/2007  . Depression with anxiety 07/26/2007  . Essential hypertension 07/26/2007  . Allergic rhinitis 07/26/2007  . Asthma 07/26/2007  . GERD 07/26/2007  . Osteoarthritis 07/26/2007  . Ocular migraine 07/26/2007  . Mild intermittent asthma without complication 01/77/9390  Janna Arch, PT, DPT   04/18/2018, 10:05 AM  [place] MAIN [facility] SERVICES [address] [place], [place], [address] Phone: [phone]   Fax:  [phone]  Name: Rachel Torres MRN: [id number] Date of Birth: 1956/01/20

## 2018-04-20 ENCOUNTER — Ambulatory Visit: Payer: Medicare HMO | Attending: Neurosurgery

## 2018-04-20 DIAGNOSIS — R293 Abnormal posture: Secondary | ICD-10-CM | POA: Diagnosis not present

## 2018-04-20 DIAGNOSIS — M5412 Radiculopathy, cervical region: Secondary | ICD-10-CM | POA: Insufficient documentation

## 2018-04-20 DIAGNOSIS — M542 Cervicalgia: Secondary | ICD-10-CM | POA: Insufficient documentation

## 2018-04-20 NOTE — Therapy (Signed)
[place] MAIN [facility] SERVICES [address] [place], [place], [address] Phone: [phone]   Fax:  [phone]  Physical Therapy Treatment  Patient Details  Name: Rachel Torres MRN: [id number] Date of Birth: 16-Jul-1956 Referring Provider: Mallie Mussel pool   Encounter Date: 04/20/2018  PT End of Session - 04/20/18 1239    Visit Number  12    Number of Visits  19    Date for PT Re-Evaluation  05/16/18    PT Start Time  0759    PT Stop Time  0845    PT Time Calculation (min)  46 min    Activity Tolerance  Treatment limited secondary to medical complications (Comment);Patient tolerated treatment well    Behavior During Therapy  [facility] for tasks assessed/performed       Past Medical History:  Diagnosis Date  . AAA (abdominal aortic aneurysm) ([place])    a. Chronic w/o evidence of aneurysmal dil on CTA 01/2016.  Marland Kitchen Allergy   . Anemia   . Anxiety   . Asthma   . CAD (coronary artery disease)    a. 08/2010 s/p CABG x 1 (VG->RCA) @ time of Ao dissection repair; b. 01/2016 Lexiscan MV: mid antsept/apical defect w/ ? peri-infarct ischemia-->likely attenuation-->Med Rx.  . Chronic combined systolic (congestive) and diastolic (congestive) heart failure ([place])    a. 2012 EF 30-35%; b. 04/2015 EF 40-45%; c. 01/2016 Echo: Ef 55-65%, nl AoV bioprosthesis, nl RV, nl PASP.  Marland Kitchen Depression   . Gallbladder sludge   . GERD (gastroesophageal reflux disease)   . History of Bicuspid Aortic Valve    a. 08/2010 s/p AVR @ time of Ao dissection repair; b. 01/2016 Echo: Ef 55-65%, nl AoV bioprosthesis, nl RV, nl PASP.  Marland Kitchen Hx of repair of dissecting thoracic aortic aneurysm, Stanford type A    a. 08/2010 s/p repair with AVR and VG->RCA; b. 01/2016 CTA: stable appearance of Asc Thoracic Aortic graft. Opacification of flase lumen of chronic abd Ao dissection w/ retrograde flow through lumbar arteries. No aneurysmal dil of flase lumen.  . Hyperlipidemia   . Hypertensive heart disease   .  IBS (irritable bowel syndrome)   . Internal hemorrhoids   . Marfan syndrome    pt denies  . Migraine   . Obstructive sleep apnea   . Osteoporosis   . Pneumonia   . RA (rheumatoid arthritis) ([place])    a. Followed by Dr. [name]    Past Surgical History:  Procedure Laterality Date  . ABDOMINAL AORTIC ANEURYSM REPAIR    . CARDIAC CATHETERIZATION  2001  . [place]  . CHOLECYSTECTOMY    . COLONOSCOPY    . FRACTURE SURGERY Right    shoulder replacement  . HEMORRHOID BANDING    . ORIF DISTAL RADIUS FRACTURE Left   . UPPER GASTROINTESTINAL ENDOSCOPY      There were no vitals filed for this visit.  Subjective Assessment - 04/20/18 0802    Subjective  Patient reports improved symptoms today. Has been compliant with HEP.     Pertinent History  Patient is a pleasant 62 year old female who presents with cervical neck pain with radiating symptoms to bilateral fingers. 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  Has high stress levels due to caregiver support. Patient has gained 30 lb.  Has AAA and numbness began seven years ago when aneurysm occured. Sleeping with a neck pillow to relieve pain. Pain  has been occurring for years.     Limitations  Sitting;Lifting;Standing;Walking;Writing;House hold activities;Other (comment);Reading    How long can you sit comfortably?  an hour     How long can you stand comfortably?  n/a    How long can you walk comfortably?  n/a    Diagnostic tests  n/a    Patient Stated Goals  decrease pain.     Currently in Pain?  Yes    Pain Score  1     Pain Location  Neck    Pain Orientation  Posterior    Pain Descriptors / Indicators  Aching    Pain Type  Chronic pain    Pain Onset  More than a month ago    Pain Frequency  Constant        Sidebending with overpressure stretch 5x 20 seconds with R more tight than L Rotation l and R with overpressure stretch5x20 seconds with R more tight than L.   Suboccipital release 4x 20 seconds   Grade I-III CPA and UPAs c2-T1hypomobile throughout with pain relief upon L C4; 4x10 seconds each level Thoracic J sweep against thoracic curvature mobilizations grade I-III 5x10 seconds   ULT median nerve glide supine10xRUE ULT radial nerve glide supine 10x RUE   STM cervical and upper paraspinal; tighter on R cervical and upper thoracic regionin seated position with head on table102mnutes  Trigger point/soft tissue release with movement to improve soft tissue limited muscle tissue length. 5 minutes  Seated scapular retractions 20x  Pt. response to medical necessity: Patient will continue to benefit from skilled physical therapy to reduce pain, improve cervical mobility, and allow patient better QOL                          PT Education - 04/20/18 1238    Education provided  Yes    Education Details  manual, posture, HEP compliance    Person(s) Educated  Patient    Methods  Explanation;Demonstration;Verbal cues    Comprehension  Verbalized understanding;Returned demonstration       PT Short Term Goals - 04/18/18 0903      PT SHORT TERM GOAL #1   Title  Patient will be independent in home exercise program to improve strength/mobility for better functional independence with ADLs.    Baseline  HEP compliance    Time  2    Period  Weeks    Status  Achieved      PT SHORT TERM GOAL #2   Title  Patient will report a worst pain of 5/10 on VAS in neck to improve tolerance with ADLs and reduced symptoms with activities.     Baseline  3/4: worst pain 8/10; 4/3: 6/10 4/30: 7/10 tension headache     Time  2    Period  Weeks    Status  Partially Met        PT Long Term Goals - 04/18/18 0904      PT LONG TERM GOAL #1   Title  Patient will report a worst pain of 3/10 on VAS in cervical spine to improve tolerance with ADLs and reduced symptoms with activities    Baseline  worst pain 8/10; 4/3: 6/10  4/30: 7/10 tension headache from caregiver.     Time  4    Period  Weeks    Status  Partially Met    Target Date  05/16/18      PT LONG TERM GOAL #  2   Title  Patient will have no tingling in bilateral hands for two consecutive weeks to improve quality of life.     Baseline   4/3: had no tingling for about a week, came back this week 3x due to husband in the hospital. 4/30: 2x a week.     Time  4    Period  Weeks    Status  Partially Met    Target Date  05/16/18      PT LONG TERM GOAL #3   Title  Patient will decrease NDI score to 28% to decrease level of perceived disability to minimal.     Baseline  3/4: 46%; 4/3: 44%: 4/30: 54%    Time  4    Period  Weeks    Status  Partially Met    Target Date  05/16/18      PT LONG TERM GOAL #4   Title  Patient will improve cervical rotation ROM to 60 degrees bilaterally to improve functional mobility.     Baseline  3/4: R 39 L 41; 4/3: R 42 , L 46 4/30: supine L55 R 54     Time  4    Period  Weeks    Status  Partially Met    Target Date  05/16/18            Plan - 04/20/18 1241    Clinical Impression Statement  Patient pain reduced after manual intervention allowing improved ROM and decreased tingling in UEs.  Muscle tension focalizing upon posterior cervical anterior portion and insertion of superomedial scapula. Patient will continue to benefit from skilled physical therapy to reduce pain, improve cervical mobility, and allow patient better QOL    Rehab Potential  Fair    Clinical Impairments Affecting Rehab Potential  + CAD testing, history of AAA, anemia, anxiety, asthma, CAD, chronic combined ystolic heart failure, GERD, Marfan syndrome, OA, RA (+) good work Psychologist, forensic, motivated, age     PT Frequency  2x / week    PT Duration  4 weeks    PT Treatment/Interventions  ADLs/Self Care Home Management;Cryotherapy;Traction;Ultrasound;Therapeutic exercise;Therapeutic activities;Neuromuscular re-education;Patient/family education;Manual  techniques;Passive range of motion;Dry needling;Energy conservation;Visual/perceptual remediation/compensation    PT Next Visit Plan  nerve glides, muscle tissue length, posture    PT Home Exercise Plan  give next session.    Consulted and Agree with Plan of Care  Patient       Patient will benefit from skilled therapeutic intervention in order to improve the following deficits and impairments:  Decreased activity tolerance, Decreased safety awareness, Decreased range of motion, Decreased mobility, Decreased strength, Impaired flexibility, Postural dysfunction, Improper body mechanics, Pain  Visit Diagnosis: Radiculopathy, cervical region  Cervicalgia  Abnormal posture     Problem List Patient Active Problem List   Diagnosis Date Noted  . Cough 09/08/2017  . Type 1 dissection of ascending aorta ([place]) 04/23/2017  . HPV (human papilloma virus) infection 12/01/2016  . Screening mammogram, encounter for 11/29/2016  . Estrogen deficiency 11/29/2016  . Encounter for routine gynecological examination 11/29/2016  . Grade III hemorrhoids 10/06/2016  . Tachycardia 08/14/2016  . Hemorrhoids 08/13/2016  . Atypical migraine 08/03/2016  . CAD (coronary artery disease)   . AAA (abdominal aortic aneurysm) ([place])   . Chronic combined systolic (congestive) and diastolic (congestive) heart failure ([place])   . Hyperglycemia 02/11/2016  . Generalized anxiety disorder 12/08/2015  . Panic attacks 07/28/2015  . Sensorineural hearing loss 03/27/2013  . Vitamin D deficiency 07/03/2012  .  H/O aortic dissection 05/12/2012  . Obesity (BMI 30.0-34.9) 11/21/2011  . Routine general medical examination at a health care facility 09/20/2011  . S/P AVR (aortic valve replacement) 01/07/2011  . CORONARY ARTERY BYPASS GRAFT, HX OF 01/07/2011  . Arthritis 12/20/2010  . H/O dissecting abdominal aortic aneurysm repair 08/24/2010  . IRRITABLE BOWEL SYNDROME 09/09/2009  . Obstructive sleep apnea 08/04/2009  .  External hemorrhoid 06/26/2009  . Osteoporosis 01/09/2009  . Hyperlipidemia 07/26/2007  . Depression with anxiety 07/26/2007  . Essential hypertension 07/26/2007  . Allergic rhinitis 07/26/2007  . Asthma 07/26/2007  . GERD 07/26/2007  . Osteoarthritis 07/26/2007  . Ocular migraine 07/26/2007  . Mild intermittent asthma without complication 10/18/1313   Janna Arch, PT, DPT   04/20/2018, 12:42 PM  [place] MAIN [facility] SERVICES [address] [place], [place], [address] Phone: [phone]   Fax:  [phone]  Name: Rachel Torres MRN: [id number] Date of Birth: [birth year]

## 2018-04-24 ENCOUNTER — Encounter: Payer: Self-pay | Admitting: Occupational Therapy

## 2018-04-24 ENCOUNTER — Ambulatory Visit: Payer: Medicare HMO

## 2018-04-24 DIAGNOSIS — M5412 Radiculopathy, cervical region: Secondary | ICD-10-CM | POA: Diagnosis not present

## 2018-04-24 DIAGNOSIS — M542 Cervicalgia: Secondary | ICD-10-CM | POA: Diagnosis not present

## 2018-04-24 DIAGNOSIS — R293 Abnormal posture: Secondary | ICD-10-CM

## 2018-04-24 NOTE — Therapy (Signed)
[place] MAIN [facility] SERVICES [address] [place], [place], [address] Phone: [phone]   Fax:  [phone]  Physical Therapy Treatment  Patient Details  Name: Rachel Torres MRN: [id number] Date of Birth: [birth year] Referring Provider: Mallie Mussel pool   Encounter Date: 04/24/2018    Past Medical History:  Diagnosis Date  . AAA (abdominal aortic aneurysm) ([place])    a. Chronic w/o evidence of aneurysmal dil on CTA 01/2016.  Marland Kitchen Allergy   . Anemia   . Anxiety   . Asthma   . CAD (coronary artery disease)    a. 08/2010 s/p CABG x 1 (VG->RCA) @ time of Ao dissection repair; b. 01/2016 Lexiscan MV: mid antsept/apical defect w/ ? peri-infarct ischemia-->likely attenuation-->Med Rx.  . Chronic combined systolic (congestive) and diastolic (congestive) heart failure ([place])    a. 2012 EF 30-35%; b. 04/2015 EF 40-45%; c. 01/2016 Echo: Ef 55-65%, nl AoV bioprosthesis, nl RV, nl PASP.  Marland Kitchen Depression   . Gallbladder sludge   . GERD (gastroesophageal reflux disease)   . History of Bicuspid Aortic Valve    a. 08/2010 s/p AVR @ time of Ao dissection repair; b. 01/2016 Echo: Ef 55-65%, nl AoV bioprosthesis, nl RV, nl PASP.  Marland Kitchen Hx of repair of dissecting thoracic aortic aneurysm, Stanford type A    a. 08/2010 s/p repair with AVR and VG->RCA; b. 01/2016 CTA: stable appearance of Asc Thoracic Aortic graft. Opacification of flase lumen of chronic abd Ao dissection w/ retrograde flow through lumbar arteries. No aneurysmal dil of flase lumen.  . Hyperlipidemia   . Hypertensive heart disease   . IBS (irritable bowel syndrome)   . Internal hemorrhoids   . Marfan syndrome    pt denies  . Migraine   . Obstructive sleep apnea   . Osteoporosis   . Pneumonia   . RA (rheumatoid arthritis) ([place])    a. Followed by Dr. [name]    Past Surgical History:  Procedure Laterality Date  . ABDOMINAL AORTIC ANEURYSM REPAIR    . CARDIAC CATHETERIZATION  2001  . [place]  . CHOLECYSTECTOMY    . COLONOSCOPY    . FRACTURE SURGERY Right    shoulder replacement  . HEMORRHOID BANDING    . ORIF DISTAL RADIUS FRACTURE Left   . UPPER GASTROINTESTINAL ENDOSCOPY      There were no vitals filed for this visit.           Sidebending with overpressure stretch 5x 20 seconds with R more tight than L Rotation l and R with overpressure stretch 5x20 seconds with R more tight than L.  Suboccipital release 4x 20 seconds    Grade I-III CPA and UPAs c2-T1 hypomobile throughout with pain relief upon L C4 ; 4x10 seconds each level Thoracic J sweep against thoracic curvature mobilizations grade I-III 5x10 seconds      ULT median nerve glide supine 10x RUE ULT radial nerve glide supine 10x RUE       STM cervical and upper paraspinal; tighter on R cervical and upper thoracic region in seated position with head on table 7 minutes   Trigger point/soft tissue release with movement to improve soft tissue limited muscle tissue length. 5 minutes   Seated scapular retractions 20x    Pt. response to medical necessity:  Patient will continue to benefit from skilled physical therapy to reduce pain, improve cervical mobility, and allow patient better QOL  PT Short Term Goals - 04/18/18 0903      PT SHORT TERM GOAL #1   Title  Patient will be independent in home exercise program to improve strength/mobility for better functional independence with ADLs.    Baseline  HEP compliance    Time  2    Period  Weeks    Status  Achieved      PT SHORT TERM GOAL #2   Title  Patient will report a worst pain of 5/10 on VAS in neck to improve tolerance with ADLs and reduced symptoms with activities.     Baseline  3/4: worst pain 8/10; 4/3: 6/10 4/30: 7/10 tension headache     Time  2    Period  Weeks    Status  Partially Met        PT Long Term Goals - 04/18/18 0904      PT LONG TERM GOAL #1   Title  Patient will report a  worst pain of 3/10 on VAS in cervical spine to improve tolerance with ADLs and reduced symptoms with activities    Baseline  worst pain 8/10; 4/3: 6/10 4/30: 7/10 tension headache from caregiver.     Time  4    Period  Weeks    Status  Partially Met    Target Date  05/16/18      PT LONG TERM GOAL #2   Title  Patient will have no tingling in bilateral hands for two consecutive weeks to improve quality of life.     Baseline   4/3: had no tingling for about a week, came back this week 3x due to husband in the hospital. 4/30: 2x a week.     Time  4    Period  Weeks    Status  Partially Met    Target Date  05/16/18      PT LONG TERM GOAL #3   Title  Patient will decrease NDI score to 28% to decrease level of perceived disability to minimal.     Baseline  3/4: 46%; 4/3: 44%: 4/30: 54%    Time  4    Period  Weeks    Status  Partially Met    Target Date  05/16/18      PT LONG TERM GOAL #4   Title  Patient will improve cervical rotation ROM to 60 degrees bilaterally to improve functional mobility.     Baseline  3/4: R 39 L 41; 4/3: R 42 , L 46 4/30: supine L55 R 54     Time  4    Period  Weeks    Status  Partially Met    Target Date  05/16/18            Plan - 04/26/18 0839    Clinical Impression Statement  Patient's pain reduced after manual interventions with improved muscle tissue length. Muscle tension focalizing on posterior cervical superioromedial portion and insertion  of superomedial scapula.  Patient will continue to benefit from skilled physical therapy to reduce pain, improve cervical mobility, and allow patient better QOL    Rehab Potential  Fair    Clinical Impairments Affecting Rehab Potential  + CAD testing, history of AAA, anemia, anxiety, asthma, CAD, chronic combined ystolic heart failure, GERD, Marfan syndrome, OA, RA (+) good work Psychologist, forensic, motivated, age     PT Frequency  2x / week    PT Duration  4 weeks    PT Treatment/Interventions  ADLs/Self Care Home  Management;Cryotherapy;Traction;Ultrasound;Therapeutic exercise;Therapeutic activities;Neuromuscular re-education;Patient/family education;Manual techniques;Passive range of motion;Dry needling;Energy conservation;Visual/perceptual remediation/compensation    PT Next Visit Plan  nerve glides, muscle tissue length, posture    PT Home Exercise Plan  give next session.    Consulted and Agree with Plan of Care  Patient       Patient will benefit from skilled therapeutic intervention in order to improve the following deficits and impairments:  Decreased activity tolerance, Decreased safety awareness, Decreased range of motion, Decreased mobility, Decreased strength, Impaired flexibility, Postural dysfunction, Improper body mechanics, Pain  Visit Diagnosis: Radiculopathy, cervical region  Cervicalgia  Abnormal posture     Problem List Patient Active Problem List   Diagnosis Date Noted  . Cough 09/08/2017  . Type 1 dissection of ascending aorta ([place]) 04/23/2017  . HPV (human papilloma virus) infection 12/01/2016  . Screening mammogram, encounter for 11/29/2016  . Estrogen deficiency 11/29/2016  . Encounter for routine gynecological examination 11/29/2016  . Grade III hemorrhoids 10/06/2016  . Tachycardia 08/14/2016  . Hemorrhoids 08/13/2016  . Atypical migraine 08/03/2016  . CAD (coronary artery disease)   . AAA (abdominal aortic aneurysm) ([place])   . Chronic combined systolic (congestive) and diastolic (congestive) heart failure ([place])   . Hyperglycemia 02/11/2016  . Generalized anxiety disorder 12/08/2015  . Panic attacks 07/28/2015  . Sensorineural hearing loss 03/27/2013  . Vitamin D deficiency 07/03/2012  . H/O aortic dissection 05/12/2012  . Obesity (BMI 30.0-34.9) 11/21/2011  . Routine general medical examination at a health care facility 09/20/2011  . S/P AVR (aortic valve replacement) 01/07/2011  . CORONARY ARTERY BYPASS GRAFT, HX OF 01/07/2011  . Arthritis 12/20/2010  .  H/O dissecting abdominal aortic aneurysm repair 08/24/2010  . IRRITABLE BOWEL SYNDROME 09/09/2009  . Obstructive sleep apnea 08/04/2009  . External hemorrhoid 06/26/2009  . Osteoporosis 01/09/2009  . Hyperlipidemia 07/26/2007  . Depression with anxiety 07/26/2007  . Essential hypertension 07/26/2007  . Allergic rhinitis 07/26/2007  . Asthma 07/26/2007  . GERD 07/26/2007  . Osteoarthritis 07/26/2007  . Ocular migraine 07/26/2007  . Mild intermittent asthma without complication 58/85/0277   Janna Arch, PT, DPT   04/26/2018, 8:40 AM  [place] MAIN [facility] SERVICES [address] [place], [place], [address] Phone: [phone]   Fax:  [phone]  Name: Rachel Torres MRN: [id number] Date of Birth: [birth year]

## 2018-04-26 ENCOUNTER — Ambulatory Visit: Payer: Medicare HMO | Admitting: Physical Therapy

## 2018-04-26 ENCOUNTER — Encounter: Payer: Self-pay | Admitting: Physical Therapy

## 2018-04-26 DIAGNOSIS — R293 Abnormal posture: Secondary | ICD-10-CM | POA: Diagnosis not present

## 2018-04-26 DIAGNOSIS — M542 Cervicalgia: Secondary | ICD-10-CM | POA: Diagnosis not present

## 2018-04-26 DIAGNOSIS — M5412 Radiculopathy, cervical region: Secondary | ICD-10-CM | POA: Diagnosis not present

## 2018-04-26 NOTE — Therapy (Signed)
[place] MAIN [facility] SERVICES [address] [place], [place], [address] Phone: [phone]   Fax:  [phone]  Physical Therapy Treatment  Patient Details  Name: Rachel Torres MRN: [id number] Date of Birth: 12-31-1955 Referring Provider: Mallie Mussel pool   Encounter Date: 04/26/2018  PT End of Session - 04/26/18 0930    Visit Number  14    Number of Visits  19    Date for PT Re-Evaluation  05/16/18    PT Start Time  0930    PT Stop Time  1011    PT Time Calculation (min)  41 min    Activity Tolerance  Treatment limited secondary to medical complications (Comment);Patient tolerated treatment well    Behavior During Therapy  [facility] for tasks assessed/performed       Past Medical History:  Diagnosis Date  . AAA (abdominal aortic aneurysm) ([place])    a. Chronic w/o evidence of aneurysmal dil on CTA 01/2016.  Marland Kitchen Allergy   . Anemia   . Anxiety   . Asthma   . CAD (coronary artery disease)    a. 08/2010 s/p CABG x 1 (VG->RCA) @ time of Ao dissection repair; b. 01/2016 Lexiscan MV: mid antsept/apical defect w/ ? peri-infarct ischemia-->likely attenuation-->Med Rx.  . Chronic combined systolic (congestive) and diastolic (congestive) heart failure ([place])    a. 2012 EF 30-35%; b. 04/2015 EF 40-45%; c. 01/2016 Echo: Ef 55-65%, nl AoV bioprosthesis, nl RV, nl PASP.  Marland Kitchen Depression   . Gallbladder sludge   . GERD (gastroesophageal reflux disease)   . History of Bicuspid Aortic Valve    a. 08/2010 s/p AVR @ time of Ao dissection repair; b. 01/2016 Echo: Ef 55-65%, nl AoV bioprosthesis, nl RV, nl PASP.  Marland Kitchen Hx of repair of dissecting thoracic aortic aneurysm, Stanford type A    a. 08/2010 s/p repair with AVR and VG->RCA; b. 01/2016 CTA: stable appearance of Asc Thoracic Aortic graft. Opacification of flase lumen of chronic abd Ao dissection w/ retrograde flow through lumbar arteries. No aneurysmal dil of flase lumen.  . Hyperlipidemia   . Hypertensive heart disease   .  IBS (irritable bowel syndrome)   . Internal hemorrhoids   . Marfan syndrome    pt denies  . Migraine   . Obstructive sleep apnea   . Osteoporosis   . Pneumonia   . RA (rheumatoid arthritis) ([place])    a. Followed by Dr. [name]    Past Surgical History:  Procedure Laterality Date  . ABDOMINAL AORTIC ANEURYSM REPAIR    . CARDIAC CATHETERIZATION  2001  . [place]  . CHOLECYSTECTOMY    . COLONOSCOPY    . FRACTURE SURGERY Right    shoulder replacement  . HEMORRHOID BANDING    . ORIF DISTAL RADIUS FRACTURE Left   . UPPER GASTROINTESTINAL ENDOSCOPY      There were no vitals filed for this visit.  Subjective Assessment - 04/26/18 0933    Subjective  Pt reports she is doing well but is feeling tight in her lower neck region today.  Pt is having a lot of family stress recently.      Pertinent History  Patient is a pleasant 62 year old female who presents with cervical neck pain with radiating symptoms to bilateral fingers. 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  Has high stress levels due to caregiver support. Patient has gained 30 lb.  Has AAA and  numbness began seven years ago when aneurysm occured. Sleeping with a neck pillow to relieve pain. Pain has been occurring for years.     Limitations  Sitting;Lifting;Standing;Walking;Writing;House hold activities;Other (comment);Reading    How long can you sit comfortably?  an hour     How long can you stand comfortably?  n/a    How long can you walk comfortably?  n/a    Diagnostic tests  n/a    Patient Stated Goals  decrease pain.     Currently in Pain?  Yes    Pain Score  2     Pain Location  Neck    Pain Orientation  Left;Right    Pain Descriptors / Indicators  Aching    Pain Type  Chronic pain    Pain Onset  More than a month ago       TREATMENT  BP 141/65, SpO2 97%, pulse 74  Sidebending with overpressure stretch 5x 20 seconds with R more tight than  L  Cervical sidebend MET x10 each direction  MWM into cervical rotation x10 Bil in sitting  Suboccipital release 4x 20 seconds   STM Bil cervical paraspinals as increased tension noted.   ULT median nerve glide supine 10x RUE  ULT radial nerve glide supine 10x RUE    Grade I-III CPA and UPAs c2-T1 hypomobile throughout; 2x20 seconds each level  STM R UT as pt reported increased tension here and confirmed upon palpation                        PT Education - 04/26/18 0930    Education provided  Yes    Education Details  Exercise technique; reasoning behind interventions    Person(s) Educated  Patient    Methods  Explanation;Demonstration;Verbal cues    Comprehension  Verbalized understanding;Returned demonstration;Verbal cues required;Need further instruction       PT Short Term Goals - 04/18/18 0903      PT SHORT TERM GOAL #1   Title  Patient will be independent in home exercise program to improve strength/mobility for better functional independence with ADLs.    Baseline  HEP compliance    Time  2    Period  Weeks    Status  Achieved      PT SHORT TERM GOAL #2   Title  Patient will report a worst pain of 5/10 on VAS in neck to improve tolerance with ADLs and reduced symptoms with activities.     Baseline  3/4: worst pain 8/10; 4/3: 6/10 4/30: 7/10 tension headache     Time  2    Period  Weeks    Status  Partially Met        PT Long Term Goals - 04/18/18 0904      PT LONG TERM GOAL #1   Title  Patient will report a worst pain of 3/10 on VAS in cervical spine to improve tolerance with ADLs and reduced symptoms with activities    Baseline  worst pain 8/10; 4/3: 6/10 4/30: 7/10 tension headache from caregiver.     Time  4    Period  Weeks    Status  Partially Met    Target Date  05/16/18      PT LONG TERM GOAL #2   Title  Patient will have no tingling in bilateral hands for two consecutive weeks to improve quality of life.     Baseline    4/3: had no tingling for  about a week, came back this week 3x due to husband in the hospital. 4/30: 2x a week.     Time  4    Period  Weeks    Status  Partially Met    Target Date  05/16/18      PT LONG TERM GOAL #3   Title  Patient will decrease NDI score to 28% to decrease level of perceived disability to minimal.     Baseline  3/4: 46%; 4/3: 44%: 4/30: 54%    Time  4    Period  Weeks    Status  Partially Met    Target Date  05/16/18      PT LONG TERM GOAL #4   Title  Patient will improve cervical rotation ROM to 60 degrees bilaterally to improve functional mobility.     Baseline  3/4: R 39 L 41; 4/3: R 42 , L 46 4/30: supine L55 R 54     Time  4    Period  Weeks    Status  Partially Met    Target Date  05/16/18            Plan - 04/26/18 4174    Clinical Impression Statement  Pt demonstrates significantly limited R cervical sidebend AROM and thus introduced MET for improved range.  Pt responded well to STM to cervical spine, thoracic spine, and suboccipital release.  She continues to respond positively to CPAs and UPAs to cervical spine.  Pt will benefit from continued skilled PT interventions for decreased pain and improved QOL.     Rehab Potential  Fair    Clinical Impairments Affecting Rehab Potential  + CAD testing, history of AAA, anemia, anxiety, asthma, CAD, chronic combined ystolic heart failure, GERD, Marfan syndrome, OA, RA (+) good work Psychologist, forensic, motivated, age     PT Frequency  2x / week    PT Duration  4 weeks    PT Treatment/Interventions  ADLs/Self Care Home Management;Cryotherapy;Traction;Ultrasound;Therapeutic exercise;Therapeutic activities;Neuromuscular re-education;Patient/family education;Manual techniques;Passive range of motion;Dry needling;Energy conservation;Visual/perceptual remediation/compensation    PT Next Visit Plan  nerve glides, muscle tissue length, posture    PT Home Exercise Plan  give next session.    Consulted and Agree with Plan of Care   Patient       Patient will benefit from skilled therapeutic intervention in order to improve the following deficits and impairments:  Decreased activity tolerance, Decreased safety awareness, Decreased range of motion, Decreased mobility, Decreased strength, Impaired flexibility, Postural dysfunction, Improper body mechanics, Pain  Visit Diagnosis: Radiculopathy, cervical region  Cervicalgia  Abnormal posture     Problem List Patient Active Problem List   Diagnosis Date Noted  . Cough 09/08/2017  . Type 1 dissection of ascending aorta ([place]) 04/23/2017  . HPV (human papilloma virus) infection 12/01/2016  . Screening mammogram, encounter for 11/29/2016  . Estrogen deficiency 11/29/2016  . Encounter for routine gynecological examination 11/29/2016  . Grade III hemorrhoids 10/06/2016  . Tachycardia 08/14/2016  . Hemorrhoids 08/13/2016  . Atypical migraine 08/03/2016  . CAD (coronary artery disease)   . AAA (abdominal aortic aneurysm) ([place])   . Chronic combined systolic (congestive) and diastolic (congestive) heart failure ([place])   . Hyperglycemia 02/11/2016  . Generalized anxiety disorder 12/08/2015  . Panic attacks 07/28/2015  . Sensorineural hearing loss 03/27/2013  . Vitamin D deficiency 07/03/2012  . H/O aortic dissection 05/12/2012  . Obesity (BMI 30.0-34.9) 11/21/2011  . Routine general medical examination at a health care facility 09/20/2011  .  S/P AVR (aortic valve replacement) 01/07/2011  . CORONARY ARTERY BYPASS GRAFT, HX OF 01/07/2011  . Arthritis 12/20/2010  . H/O dissecting abdominal aortic aneurysm repair 08/24/2010  . IRRITABLE BOWEL SYNDROME 09/09/2009  . Obstructive sleep apnea 08/04/2009  . External hemorrhoid 06/26/2009  . Osteoporosis 01/09/2009  . Hyperlipidemia 07/26/2007  . Depression with anxiety 07/26/2007  . Essential hypertension 07/26/2007  . Allergic rhinitis 07/26/2007  . Asthma 07/26/2007  . GERD 07/26/2007  . Osteoarthritis 07/26/2007   . Ocular migraine 07/26/2007  . Mild intermittent asthma without complication 12/92/9090    Collie Siad PT, DPT 04/26/2018, 10:13 AM  [place] MAIN [facility] SERVICES [address] [place], [place], [address] Phone: [phone]   Fax:  [phone]  Name: Rachel Torres MRN: [id number] Date of Birth: [birth year]

## 2018-04-28 ENCOUNTER — Ambulatory Visit (INDEPENDENT_AMBULATORY_CARE_PROVIDER_SITE_OTHER): Payer: Medicare HMO | Admitting: Psychology

## 2018-04-28 DIAGNOSIS — F4323 Adjustment disorder with mixed anxiety and depressed mood: Secondary | ICD-10-CM | POA: Diagnosis not present

## 2018-05-01 ENCOUNTER — Ambulatory Visit (INDEPENDENT_AMBULATORY_CARE_PROVIDER_SITE_OTHER): Payer: Medicare HMO | Admitting: Psychology

## 2018-05-01 DIAGNOSIS — F4323 Adjustment disorder with mixed anxiety and depressed mood: Secondary | ICD-10-CM | POA: Diagnosis not present

## 2018-05-02 ENCOUNTER — Encounter: Payer: Self-pay | Admitting: Family Medicine

## 2018-05-02 ENCOUNTER — Ambulatory Visit (INDEPENDENT_AMBULATORY_CARE_PROVIDER_SITE_OTHER): Payer: Medicare HMO | Admitting: Family Medicine

## 2018-05-02 VITALS — BP 118/76 | HR 75 | Temp 97.9°F | Ht 65.5 in | Wt 207.8 lb

## 2018-05-02 DIAGNOSIS — F410 Panic disorder [episodic paroxysmal anxiety] without agoraphobia: Secondary | ICD-10-CM | POA: Diagnosis not present

## 2018-05-02 DIAGNOSIS — G43009 Migraine without aura, not intractable, without status migrainosus: Secondary | ICD-10-CM

## 2018-05-02 MED ORDER — ALPRAZOLAM 0.5 MG PO TABS: 0.5000 mg | ORAL_TABLET | Freq: Every day | ORAL | 0 refills | Status: DC | PRN

## 2018-05-02 MED ORDER — PROMETHAZINE HCL 25 MG PO TABS: 12.5000 mg | ORAL_TABLET | Freq: Three times a day (TID) | ORAL | 3 refills | Status: DC | PRN

## 2018-05-02 MED ORDER — GABAPENTIN 100 MG PO CAPS: 300.0000 mg | ORAL_CAPSULE | Freq: Every day | ORAL | 5 refills | Status: DC

## 2018-05-02 NOTE — Patient Instructions (Signed)
Take gabapentin for headache prevention  Start with 100 mg at bedtime- if well tolerated gradually increase to 300 mg at bedtime   Later we can increase to two or three times daily if needed   Take care of yourself  Hope this will help headaches and sleep   Keep fluid intake up  Avoid caffeine   Keep [place] posted

## 2018-05-02 NOTE — Progress Notes (Signed)
Subjective:    Patient ID: Rachel Torres, female    DOB: 1956/11/20, 62 y.o.   MRN: [id number]  HPI Here for f/u of migraines   If naproxen does not take care of it she has to take sedating medicine (and she is the only driver in the house)  Flexeril helps - but it puts her to sleep  Usually take phenergan with it  Has to take them no more than twice and they do help  Relaxation exercises - also seeing PT  Cold compresses help also   No triptans with heart condition   Gets headache almost weekly  A headache will last 2 days if she does not take anything   Naproxen itself helps 50% of the time   Headache occurs behind R eye- dull ache  Throbs a little bit  occ sharp pain in back of her head  Gets nauseated  No vomiting (usually)  sens to light and sound   Wt Readings from Last 3 Encounters:  05/02/18 207 lb 12 oz (94.2 kg)  01/25/18 206 lb (93.4 kg)  01/24/18 206 lb 8 oz (93.7 kg)   34.05 kg/m    Past - Flexeril  Phenergan  effexor   Has been on prophylaxis in the past  Was on several things that did not work- one caused vision to change   Has never been on gabapentin     Has had counseling for stress (trigger)  Still going to that - it is helpful  Stress -husband/mother-family problems Lack of sleep is a trigger  Other triggers (avoids) like peanuts, weather  -not allergies that she knows   Xanax for panic attacks - prn   Patient Active Problem List   Diagnosis Date Noted  . Cough 09/08/2017  . Type 1 dissection of ascending aorta ([place]) 04/23/2017  . HPV (human papilloma virus) infection 12/01/2016  . Screening mammogram, encounter for 11/29/2016  . Estrogen deficiency 11/29/2016  . Encounter for routine gynecological examination 11/29/2016  . Grade III hemorrhoids 10/06/2016  . Tachycardia 08/14/2016  . Hemorrhoids 08/13/2016  . Atypical migraine 08/03/2016  . CAD (coronary artery disease)   . AAA (abdominal aortic aneurysm) ([place])   .  Chronic combined systolic (congestive) and diastolic (congestive) heart failure ([place])   . Hyperglycemia 02/11/2016  . Generalized anxiety disorder 12/08/2015  . Panic attacks 07/28/2015  . Sensorineural hearing loss 03/27/2013  . Vitamin D deficiency 07/03/2012  . H/O aortic dissection 05/12/2012  . Obesity (BMI 30.0-34.9) 11/21/2011  . Routine general medical examination at a health care facility 09/20/2011  . S/P AVR (aortic valve replacement) 01/07/2011  . CORONARY ARTERY BYPASS GRAFT, HX OF 01/07/2011  . Arthritis 12/20/2010  . H/O dissecting abdominal aortic aneurysm repair 08/24/2010  . IRRITABLE BOWEL SYNDROME 09/09/2009  . Obstructive sleep apnea 08/04/2009  . External hemorrhoid 06/26/2009  . Osteoporosis 01/09/2009  . Hyperlipidemia 07/26/2007  . Depression with anxiety 07/26/2007  . Essential hypertension 07/26/2007  . Allergic rhinitis 07/26/2007  . Asthma 07/26/2007  . GERD 07/26/2007  . Osteoarthritis 07/26/2007  . Ocular migraine 07/26/2007  . Mild intermittent asthma without complication 85/01/7740   Past Medical History:  Diagnosis Date  . AAA (abdominal aortic aneurysm) ([place])    a. Chronic w/o evidence of aneurysmal dil on CTA 01/2016.  Marland Kitchen Allergy   . Anemia   . Anxiety   . Asthma   . CAD (coronary artery disease)    a. 08/2010 s/p CABG x 1 (VG->RCA) @ time  of Ao dissection repair; b. 01/2016 Lexiscan MV: mid antsept/apical defect w/ ? peri-infarct ischemia-->likely attenuation-->Med Rx.  . Chronic combined systolic (congestive) and diastolic (congestive) heart failure ([place])    a. 2012 EF 30-35%; b. 04/2015 EF 40-45%; c. 01/2016 Echo: Ef 55-65%, nl AoV bioprosthesis, nl RV, nl PASP.  Marland Kitchen Depression   . Gallbladder sludge   . GERD (gastroesophageal reflux disease)   . History of Bicuspid Aortic Valve    a. 08/2010 s/p AVR @ time of Ao dissection repair; b. 01/2016 Echo: Ef 55-65%, nl AoV bioprosthesis, nl RV, nl PASP.  Marland Kitchen Hx of repair of dissecting thoracic aortic  aneurysm, Stanford type A    a. 08/2010 s/p repair with AVR and VG->RCA; b. 01/2016 CTA: stable appearance of Asc Thoracic Aortic graft. Opacification of flase lumen of chronic abd Ao dissection w/ retrograde flow through lumbar arteries. No aneurysmal dil of flase lumen.  . Hyperlipidemia   . Hypertensive heart disease   . IBS (irritable bowel syndrome)   . Internal hemorrhoids   . Marfan syndrome    pt denies  . Migraine   . Obstructive sleep apnea   . Osteoporosis   . Pneumonia   . RA (rheumatoid arthritis) ([place])    a. Followed by Dr. [name]   Past Surgical History:  Procedure Laterality Date  . ABDOMINAL AORTIC ANEURYSM REPAIR    . CARDIAC CATHETERIZATION  2001  . [place]  . CHOLECYSTECTOMY    . COLONOSCOPY    . FRACTURE SURGERY Right    shoulder replacement  . HEMORRHOID BANDING    . ORIF DISTAL RADIUS FRACTURE Left   . UPPER GASTROINTESTINAL ENDOSCOPY     Social History   Tobacco Use  . Smoking status: Former Smoker    Last attempt to quit: 12/20/1978    Years since quitting: 39.3  . Smokeless tobacco: Never Used  Substance Use Topics  . Alcohol use: No    Alcohol/week: 0.0 oz  . Drug use: No   Family History  Problem Relation Age of Onset  . Hypertension Mother   . Depression Mother   . Osteoporosis Mother   . Stroke Mother   . Irritable bowel syndrome Mother   . Asthma Mother   . Arthritis Sister   . Diabetes Sister   . Heart disease Sister   . Asthma Sister   . Migraines Sister   . Nephrolithiasis Sister   . Osteoporosis Sister   . Arthritis Sister   . Asthma Sister   . Migraines Sister   . Other Father 23  . Stroke Maternal Grandmother   . Colon cancer Maternal Grandmother   . Lymphoma Maternal Grandmother   . Migraines Maternal Grandmother   . Aneurysm Paternal Grandmother        brain  . Diabetes Paternal Grandmother   . Arthritis Paternal Grandmother   . Arthritis Paternal Grandfather   . Diabetes Paternal Grandfather    . Other Brother        prediabeties  . Asthma Brother   . Migraines Brother   . Lung cancer Maternal Grandfather   . Melanoma Maternal Grandfather    Allergies  Allergen Reactions  . Fosamax [Alendronate Sodium]     GI upset   . Ginzing [Ginseng] Other (See Comments)    Hyper and jitery   . Hydrocod Polst-Cpm Polst Er Itching  . Hydrocodone-Acetaminophen Itching  . Imitrex [Sumatriptan]     Contraindicated w/ heart condition  . Oxycodone  Itching  . Peanut-Containing Drug Products Other (See Comments)    Reaction:  Migraines   . Tramadol Other (See Comments)    itching  . Hydrocodone-Guaifenesin Itching  . Prednisone Rash  . Tape Itching   Current Outpatient Medications on File Prior to Visit  Medication Sig Dispense Refill  . albuterol (PROVENTIL HFA;VENTOLIN HFA) 108 (90 Base) MCG/ACT inhaler Inhale 1-2 puffs into the lungs every 6 (six) hours as needed for wheezing or shortness of breath. 1 Inhaler 0  . aspirin EC 81 MG tablet Take 81 mg by mouth at bedtime.    Marland Kitchen atorvastatin (LIPITOR) 20 MG tablet Take 1 tablet (20 mg total) by mouth daily. 90 tablet 0  . b complex vitamins tablet Take 1 tablet by mouth daily.    . benzonatate (TESSALON) 200 MG capsule Take 1 capsule (200 mg total) by mouth 3 (three) times daily as needed. 30 capsule 0  . BIOTIN PO Take 1 capsule by mouth daily.    . carvedilol (COREG) 6.25 MG tablet Take 1 tablet (6.25 mg total) by mouth 2 (two) times daily with a meal. 180 tablet 3  . Cholecalciferol (VITAMIN D3) 5000 units CAPS Take 1 capsule by mouth daily.    . cyclobenzaprine (FLEXERIL) 10 MG tablet Take 0.5-1 tablets (5-10 mg total) by mouth 3 (three) times daily as needed. For headache 90 tablet 3  . furosemide (LASIX) 20 MG tablet Take 1 tablet (20 mg total) by mouth daily. 90 tablet 1  . hydrocortisone 2.5 % cream Apply topically 2 (two) times daily. For maximum 7 days. 28 g 0  . Krill Oil (OMEGA-3) 500 MG CAPS Take 1 capsule by mouth daily.      Marland Kitchen lisinopril (PRINIVIL,ZESTRIL) 5 MG tablet TAKE 1/2 TABLET (2.5 MG TOTAL) BY MOUTH DAILY. 45 tablet 3  . magnesium gluconate (MAGONATE) 500 MG tablet Take 500 mg by mouth at bedtime.    . Misc Natural Products (TART CHERRY ADVANCED) CAPS Take 1 capsule by mouth daily.    . naproxen sodium (ANAPROX) 220 MG tablet Take 440 mg by mouth 2 (two) times daily as needed (for headaches/pain).    . nitroGLYCERIN (NITROSTAT) 0.4 MG SL tablet Place 1 tablet (0.4 mg total) under the tongue every 5 (five) minutes as needed for chest pain. 25 tablet 3  . omeprazole (PRILOSEC) 40 MG capsule Take 1 capsule (40 mg total) by mouth daily. 90 capsule 3  . potassium chloride (K-DUR,KLOR-CON) 10 MEQ tablet Take 1 tablet (10 mEq total) by mouth 2 (two) times daily as needed. 180 tablet 3  . venlafaxine XR (EFFEXOR-XR) 150 MG 24 hr capsule Take 1 capsule (150 mg total) by mouth daily with breakfast. 90 capsule 3   No current facility-administered medications on file prior to visit.      Review of Systems  Constitutional: Positive for fatigue. Negative for activity change, appetite change, fever and unexpected weight change.  HENT: Negative for congestion, ear pain, rhinorrhea, sinus pressure and sore throat.   Eyes: Negative for pain, redness and visual disturbance.  Respiratory: Negative for cough, shortness of breath and wheezing.   Cardiovascular: Negative for chest pain and palpitations.  Gastrointestinal: Positive for nausea. Negative for abdominal pain, blood in stool, constipation and diarrhea.  Endocrine: Negative for polydipsia and polyuria.  Genitourinary: Negative for dysuria, frequency and urgency.  Musculoskeletal: Negative for arthralgias, back pain and myalgias.  Skin: Negative for pallor and rash.  Allergic/Immunologic: Negative for environmental allergies.  Neurological: Positive for headaches.  Negative for dizziness, tremors, seizures, syncope, facial asymmetry, speech difficulty, weakness and  numbness.  Hematological: Negative for adenopathy. Does not bruise/bleed easily.  Psychiatric/Behavioral: Positive for sleep disturbance. Negative for decreased concentration and dysphoric mood. The patient is nervous/anxious.        Stressors and caregiver stress       Objective:   Physical Exam  Constitutional: She is oriented to person, place, and time. She appears well-developed and well-nourished. No distress.  obese and well appearing  C/o of mild headache today  HENT:  Head: Normocephalic and atraumatic.  Right Ear: External ear normal.  Left Ear: External ear normal.  Nose: Nose normal.  Mouth/Throat: Oropharynx is clear and moist. No oropharyngeal exudate.  No sinus tenderness No temporal tenderness  No TMJ tenderness  Eyes: Pupils are equal, round, and reactive to light. Conjunctivae and EOM are normal. Right eye exhibits no discharge. Left eye exhibits no discharge. No scleral icterus.  No nystagmus  Neck: Normal range of motion and full passive range of motion without pain. Neck supple. No JVD present. Carotid bruit is not present. No tracheal deviation present. No thyromegaly present.  Cardiovascular: Normal rate and regular rhythm.  No murmur heard. Pulmonary/Chest: Effort normal and breath sounds normal. No respiratory distress. She has no wheezes. She has no rales.  Abdominal: Soft. Bowel sounds are normal. There is no tenderness.  Musculoskeletal: She exhibits no edema or tenderness.  Lymphadenopathy:    She has no cervical adenopathy.  Neurological: She is alert and oriented to person, place, and time. She has normal strength and normal reflexes. She displays no atrophy, no tremor and normal reflexes. No cranial nerve deficit or sensory deficit. She exhibits normal muscle tone. She displays a negative Romberg sign. Coordination and gait normal.  No focal cerebellar signs   Skin: Skin is warm and dry. Capillary refill takes less than 2 seconds. No rash noted. No  pallor.  Psychiatric: She has a normal mood and affect. Her behavior is normal. Thought content normal.  Pleasant affect  Seems generally stressed and tired           Assessment & Plan:   Problem List Items Addressed This Visit      Cardiovascular and Mediastinum   Atypical migraine - Primary    Migraine w/o aura now  More frequent with stress (major trigger) along with fatigue and poor sleep  Flexeril helps for rescue but is sedating / as is phenergan Trying to avoid rebound with nsaid  Disc prophylaxis  Trial of gabapentin 100- titrate to 300 at bedtime  Can dose tid later if helpful  Rev potential side eff Op to change flexeril to robaxin later if she wants to  Rev lifestyle habits for headache  Unfortunately cannot get help at home with caregiving       Relevant Medications   gabapentin (NEURONTIN) 100 MG capsule     Other   Panic attacks    Refilled xanax which she uses infrequently  Disc habit/sedating potential of this and to avoid unless abs necessary  Reviewed stressors/ coping techniques/symptoms/ support sources/ tx options and side effects in detail today       Relevant Medications   ALPRAZolam (XANAX) 0.5 MG tablet

## 2018-05-03 ENCOUNTER — Ambulatory Visit: Payer: Medicare HMO

## 2018-05-03 DIAGNOSIS — M542 Cervicalgia: Secondary | ICD-10-CM

## 2018-05-03 DIAGNOSIS — R293 Abnormal posture: Secondary | ICD-10-CM

## 2018-05-03 DIAGNOSIS — M5412 Radiculopathy, cervical region: Secondary | ICD-10-CM | POA: Diagnosis not present

## 2018-05-03 NOTE — Assessment & Plan Note (Addendum)
Migraine w/o aura now  More frequent with stress (major trigger) along with fatigue and poor sleep  Flexeril helps for rescue but is sedating / as is phenergan Trying to avoid rebound with nsaid  Disc prophylaxis  Trial of gabapentin 100- titrate to 300 at bedtime  Can dose tid later if helpful  Rev potential side eff Op to change flexeril to robaxin later if she wants to  Rev lifestyle habits for headache  Unfortunately cannot get help at home with caregiving

## 2018-05-03 NOTE — Assessment & Plan Note (Signed)
Refilled xanax which she uses infrequently  Disc habit/sedating potential of this and to avoid unless abs necessary  Reviewed stressors/ coping techniques/symptoms/ support sources/ tx options and side effects in detail today

## 2018-05-03 NOTE — Therapy (Signed)
[place] MAIN [facility] SERVICES [address] [place], [place], [address] Phone: [phone]   Fax:  [phone]  Physical Therapy Treatment  Patient Details  Name: Rachel Torres MRN: [id number] Date of Birth: Jan 19, 1956 Referring Provider: Mallie Mussel pool   Encounter Date: 05/03/2018  PT End of Session - 05/03/18 1600    Visit Number  15    Number of Visits  19    Date for PT Re-Evaluation  05/16/18    PT Start Time  0932    PT Stop Time  1015    PT Time Calculation (min)  43 min    Activity Tolerance  Treatment limited secondary to medical complications (Comment);Patient tolerated treatment well    Behavior During Therapy  [facility] for tasks assessed/performed       Past Medical History:  Diagnosis Date  . AAA (abdominal aortic aneurysm) ([place])    a. Chronic w/o evidence of aneurysmal dil on CTA 01/2016.  Marland Kitchen Allergy   . Anemia   . Anxiety   . Asthma   . CAD (coronary artery disease)    a. 08/2010 s/p CABG x 1 (VG->RCA) @ time of Ao dissection repair; b. 01/2016 Lexiscan MV: mid antsept/apical defect w/ ? peri-infarct ischemia-->likely attenuation-->Med Rx.  . Chronic combined systolic (congestive) and diastolic (congestive) heart failure ([place])    a. 2012 EF 30-35%; b. 04/2015 EF 40-45%; c. 01/2016 Echo: Ef 55-65%, nl AoV bioprosthesis, nl RV, nl PASP.  Marland Kitchen Depression   . Gallbladder sludge   . GERD (gastroesophageal reflux disease)   . History of Bicuspid Aortic Valve    a. 08/2010 s/p AVR @ time of Ao dissection repair; b. 01/2016 Echo: Ef 55-65%, nl AoV bioprosthesis, nl RV, nl PASP.  Marland Kitchen Hx of repair of dissecting thoracic aortic aneurysm, Stanford type A    a. 08/2010 s/p repair with AVR and VG->RCA; b. 01/2016 CTA: stable appearance of Asc Thoracic Aortic graft. Opacification of flase lumen of chronic abd Ao dissection w/ retrograde flow through lumbar arteries. No aneurysmal dil of flase lumen.  . Hyperlipidemia   . Hypertensive heart disease   .  IBS (irritable bowel syndrome)   . Internal hemorrhoids   . Marfan syndrome    pt denies  . Migraine   . Obstructive sleep apnea   . Osteoporosis   . Pneumonia   . RA (rheumatoid arthritis) ([place])    a. Followed by Dr. [name]    Past Surgical History:  Procedure Laterality Date  . ABDOMINAL AORTIC ANEURYSM REPAIR    . CARDIAC CATHETERIZATION  2001  . [place]  . CHOLECYSTECTOMY    . COLONOSCOPY    . FRACTURE SURGERY Right    shoulder replacement  . HEMORRHOID BANDING    . ORIF DISTAL RADIUS FRACTURE Left   . UPPER GASTROINTESTINAL ENDOSCOPY      There were no vitals filed for this visit.  Subjective Assessment - 05/03/18 0937    Subjective  Patient reports having severe headache last night that was 8/10 waking her up. Having family stress. Compliant with HEP    Pertinent History  Patient is a pleasant 62 year old female who presents with cervical neck pain with radiating symptoms to bilateral fingers. 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  Has high stress levels due to caregiver support. Patient has gained 30 lb.  Has AAA and numbness began seven years ago when aneurysm occured. Sleeping  with a neck pillow to relieve pain. Pain has been occurring for years.     Limitations  Sitting;Lifting;Standing;Walking;Writing;House hold activities;Other (comment);Reading    How long can you sit comfortably?  an hour     How long can you stand comfortably?  n/a    How long can you walk comfortably?  n/a    Diagnostic tests  n/a    Patient Stated Goals  decrease pain.     Currently in Pain?  Yes    Pain Score  4     Pain Location  Neck    Pain Orientation  Posterior    Pain Descriptors / Indicators  Aching    Pain Type  Chronic pain    Pain Onset  More than a month ago    Pain Frequency  Constant      146/66 pulse 89    education on need to check BP when having an abnormal headache   Sidebending with  overpressure stretch 5x 20 seconds with R more tight than L Rotation l and R with overpressure stretch 5x20 seconds with R more tight than L.  Suboccipital release 4x 20 seconds    Grade I-III CPA and UPAs c2-T1 hypomobile throughout with pain relief upon L C4 ; 4x10 seconds each level Thoracic J sweep against thoracic curvature mobilizations grade I-III 5x10 seconds      ULT median nerve glide supine 10x RUE ULT radial nerve glide supine 10x RUE       STM cervical and upper paraspinal; tighter on R cervical and upper thoracic region in seated position with head on table 7 minutes   Trigger point/soft tissue release with movement to improve soft tissue limited muscle tissue length. 5 minutes   Seated scapular retractions 20x    Pt. response to medical necessity:  Patient will continue to benefit from skilled physical therapy to reduce pain, improve cervical mobility, and allow patient better QOL   pain 1/10 after              PT Education - 05/03/18 1600    Education provided  Yes    Education Details  posture, manual     Person(s) Educated  Patient    Methods  Explanation;Demonstration;Verbal cues    Comprehension  Verbalized understanding;Returned demonstration       PT Short Term Goals - 04/18/18 0903      PT SHORT TERM GOAL #1   Title  Patient will be independent in home exercise program to improve strength/mobility for better functional independence with ADLs.    Baseline  HEP compliance    Time  2    Period  Weeks    Status  Achieved      PT SHORT TERM GOAL #2   Title  Patient will report a worst pain of 5/10 on VAS in neck to improve tolerance with ADLs and reduced symptoms with activities.     Baseline  3/4: worst pain 8/10; 4/3: 6/10 4/30: 7/10 tension headache     Time  2    Period  Weeks    Status  Partially Met        PT Long Term Goals - 04/18/18 0904      PT LONG TERM GOAL #1   Title  Patient will report a worst pain of 3/10 on VAS in  cervical spine to improve tolerance with ADLs and reduced symptoms with activities    Baseline  worst pain 8/10; 4/3: 6/10 4/30: 7/10 tension headache  from caregiver.     Time  4    Period  Weeks    Status  Partially Met    Target Date  05/16/18      PT LONG TERM GOAL #2   Title  Patient will have no tingling in bilateral hands for two consecutive weeks to improve quality of life.     Baseline   4/3: had no tingling for about a week, came back this week 3x due to husband in the hospital. 4/30: 2x a week.     Time  4    Period  Weeks    Status  Partially Met    Target Date  05/16/18      PT LONG TERM GOAL #3   Title  Patient will decrease NDI score to 28% to decrease level of perceived disability to minimal.     Baseline  3/4: 46%; 4/3: 44%: 4/30: 54%    Time  4    Period  Weeks    Status  Partially Met    Target Date  05/16/18      PT LONG TERM GOAL #4   Title  Patient will improve cervical rotation ROM to 60 degrees bilaterally to improve functional mobility.     Baseline  3/4: R 39 L 41; 4/3: R 42 , L 46 4/30: supine L55 R 54     Time  4    Period  Weeks    Status  Partially Met    Target Date  05/16/18            Plan - 05/03/18 1601    Clinical Impression Statement  Patient presents with increased R cervical muscle tissue tension due to recent stress with family and personal life. Educated on need to check BP after severe headaches/changes in symptoms. Pain reduced to 1/10 after manual this session.  Patient will continue to benefit from skilled physical therapy to reduce pain, improve cervical mobility, and allow patient better QOL    Rehab Potential  Fair    Clinical Impairments Affecting Rehab Potential  + CAD testing, history of AAA, anemia, anxiety, asthma, CAD, chronic combined ystolic heart failure, GERD, Marfan syndrome, OA, RA (+) good work Psychologist, forensic, motivated, age     PT Frequency  2x / week    PT Duration  4 weeks    PT Treatment/Interventions  ADLs/Self Care  Home Management;Cryotherapy;Traction;Ultrasound;Therapeutic exercise;Therapeutic activities;Neuromuscular re-education;Patient/family education;Manual techniques;Passive range of motion;Dry needling;Energy conservation;Visual/perceptual remediation/compensation    PT Next Visit Plan  nerve glides, muscle tissue length, posture    PT Home Exercise Plan  give next session.    Consulted and Agree with Plan of Care  Patient       Patient will benefit from skilled therapeutic intervention in order to improve the following deficits and impairments:  Decreased activity tolerance, Decreased safety awareness, Decreased range of motion, Decreased mobility, Decreased strength, Impaired flexibility, Postural dysfunction, Improper body mechanics, Pain  Visit Diagnosis: Radiculopathy, cervical region  Cervicalgia  Abnormal posture     Problem List Patient Active Problem List   Diagnosis Date Noted  . Cough 09/08/2017  . Type 1 dissection of ascending aorta ([place]) 04/23/2017  . HPV (human papilloma virus) infection 12/01/2016  . Screening mammogram, encounter for 11/29/2016  . Estrogen deficiency 11/29/2016  . Encounter for routine gynecological examination 11/29/2016  . Grade III hemorrhoids 10/06/2016  . Tachycardia 08/14/2016  . Hemorrhoids 08/13/2016  . Atypical migraine 08/03/2016  . CAD (coronary artery disease)   .  AAA (abdominal aortic aneurysm) ([place])   . Chronic combined systolic (congestive) and diastolic (congestive) heart failure ([place])   . Hyperglycemia 02/11/2016  . Generalized anxiety disorder 12/08/2015  . Panic attacks 07/28/2015  . Sensorineural hearing loss 03/27/2013  . Vitamin D deficiency 07/03/2012  . H/O aortic dissection 05/12/2012  . Obesity (BMI 30.0-34.9) 11/21/2011  . Routine general medical examination at a health care facility 09/20/2011  . S/P AVR (aortic valve replacement) 01/07/2011  . CORONARY ARTERY BYPASS GRAFT, HX OF 01/07/2011  . Arthritis 12/20/2010   . H/O dissecting abdominal aortic aneurysm repair 08/24/2010  . IRRITABLE BOWEL SYNDROME 09/09/2009  . Obstructive sleep apnea 08/04/2009  . External hemorrhoid 06/26/2009  . Osteoporosis 01/09/2009  . Hyperlipidemia 07/26/2007  . Depression with anxiety 07/26/2007  . Essential hypertension 07/26/2007  . Allergic rhinitis 07/26/2007  . Asthma 07/26/2007  . GERD 07/26/2007  . Osteoarthritis 07/26/2007  . Ocular migraine 07/26/2007  . Mild intermittent asthma without complication [id number]/43/8381   Janna Arch, PT, DPT   05/03/2018, 4:02 PM  [place] MAIN [facility] SERVICES [address] [place], [place], [address] Phone: [phone]   Fax:  [phone]  Name: DALEYSSA LOISELLE MRN: [id number] Date of Birth: 12/18/1956

## 2018-05-10 ENCOUNTER — Ambulatory Visit: Payer: Medicare HMO

## 2018-05-10 DIAGNOSIS — R293 Abnormal posture: Secondary | ICD-10-CM | POA: Diagnosis not present

## 2018-05-10 DIAGNOSIS — M542 Cervicalgia: Secondary | ICD-10-CM

## 2018-05-10 DIAGNOSIS — M5412 Radiculopathy, cervical region: Secondary | ICD-10-CM | POA: Diagnosis not present

## 2018-05-10 NOTE — Therapy (Signed)
[place] MAIN [facility] SERVICES [address] [place], [place], [address] Phone: [phone]   Fax:  [phone]  Physical Therapy Treatment  Patient Details  Name: Rachel Torres MRN: [id number] Date of Birth: 1956/10/30 Referring Provider: Mallie Mussel pool   Encounter Date: 05/10/2018  PT End of Session - 05/10/18 1013    Visit Number  16    Number of Visits  19    Date for PT Re-Evaluation  05/16/18    PT Start Time  0843    PT Stop Time  0930    PT Time Calculation (min)  47 min    Activity Tolerance  Treatment limited secondary to medical complications (Comment);Patient tolerated treatment well    Behavior During Therapy  [facility] for tasks assessed/performed       Past Medical History:  Diagnosis Date  . AAA (abdominal aortic aneurysm) ([place])    a. Chronic w/o evidence of aneurysmal dil on CTA 01/2016.  Marland Kitchen Allergy   . Anemia   . Anxiety   . Asthma   . CAD (coronary artery disease)    a. 08/2010 s/p CABG x 1 (VG->RCA) @ time of Ao dissection repair; b. 01/2016 Lexiscan MV: mid antsept/apical defect w/ ? peri-infarct ischemia-->likely attenuation-->Med Rx.  . Chronic combined systolic (congestive) and diastolic (congestive) heart failure ([place])    a. 2012 EF 30-35%; b. 04/2015 EF 40-45%; c. 01/2016 Echo: Ef 55-65%, nl AoV bioprosthesis, nl RV, nl PASP.  Marland Kitchen Depression   . Gallbladder sludge   . GERD (gastroesophageal reflux disease)   . History of Bicuspid Aortic Valve    a. 08/2010 s/p AVR @ time of Ao dissection repair; b. 01/2016 Echo: Ef 55-65%, nl AoV bioprosthesis, nl RV, nl PASP.  Marland Kitchen Hx of repair of dissecting thoracic aortic aneurysm, Stanford type A    a. 08/2010 s/p repair with AVR and VG->RCA; b. 01/2016 CTA: stable appearance of Asc Thoracic Aortic graft. Opacification of flase lumen of chronic abd Ao dissection w/ retrograde flow through lumbar arteries. No aneurysmal dil of flase lumen.  . Hyperlipidemia   . Hypertensive heart disease   .  IBS (irritable bowel syndrome)   . Internal hemorrhoids   . Marfan syndrome    pt denies  . Migraine   . Obstructive sleep apnea   . Osteoporosis   . Pneumonia   . RA (rheumatoid arthritis) ([place])    a. Followed by Dr. [name]    Past Surgical History:  Procedure Laterality Date  . ABDOMINAL AORTIC ANEURYSM REPAIR    . CARDIAC CATHETERIZATION  2001  . [place]  . CHOLECYSTECTOMY    . COLONOSCOPY    . FRACTURE SURGERY Right    shoulder replacement  . HEMORRHOID BANDING    . ORIF DISTAL RADIUS FRACTURE Left   . UPPER GASTROINTESTINAL ENDOSCOPY      There were no vitals filed for this visit.  Subjective Assessment - 05/10/18 0847    Subjective  Patient has had a week since last session. Reports severe pain of 8/10 while picking up husband from floor. Pain now is 1/10     Pertinent History  Patient is a pleasant 62 year old female who presents with cervical neck pain with radiating symptoms to bilateral fingers. 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  Has high stress levels due to caregiver support. Patient has gained 30 lb.  Has AAA and numbness began seven years  ago when aneurysm occured. Sleeping with a neck pillow to relieve pain. Pain has been occurring for years.     Limitations  Sitting;Lifting;Standing;Walking;Writing;House hold activities;Other (comment);Reading    How long can you sit comfortably?  an hour     How long can you stand comfortably?  n/a    How long can you walk comfortably?  n/a    Diagnostic tests  n/a    Patient Stated Goals  decrease pain.     Currently in Pain?  Yes    Pain Score  1     Pain Location  Neck    Pain Orientation  Posterior    Pain Descriptors / Indicators  Aching    Pain Type  Chronic pain    Pain Onset  More than a month ago    Pain Frequency  Constant       Sidebending with overpressure stretch 5x 20 seconds with R more tight than L Rotation l and R with  overpressure stretch 5x20 seconds with R more tight than L.  Suboccipital release 4x 20 seconds    Grade I-III CPA and UPAs c2-T1 hypomobile throughout with pain relief upon L C4 ; 4x10 seconds each level Thoracic J sweep against thoracic curvature mobilizations grade I-III 5x10 seconds      ULT median nerve glide supine 10x RUE ULT radial nerve glide supine 10x RUE       STM cervical and upper paraspinal; tighter on R cervical and upper thoracic region in seated position with head on table 7 minutes   Trigger point/soft tissue release with movement to improve soft tissue limited muscle tissue length. 5 minutes   Seated scapular retractions 20x    Pt. response to medical necessity:  Patient will continue to benefit from skilled physical therapy to reduce pain, improve cervical mobility, and allow patient better QOL                             PT Education - 05/10/18 1013    Education provided  Yes    Education Details  posture, manual     Person(s) Educated  Patient    Methods  Explanation;Demonstration;Verbal cues    Comprehension  Verbalized understanding;Returned demonstration       PT Short Term Goals - 04/18/18 0903      PT SHORT TERM GOAL #1   Title  Patient will be independent in home exercise program to improve strength/mobility for better functional independence with ADLs.    Baseline  HEP compliance    Time  2    Period  Weeks    Status  Achieved      PT SHORT TERM GOAL #2   Title  Patient will report a worst pain of 5/10 on VAS in neck to improve tolerance with ADLs and reduced symptoms with activities.     Baseline  3/4: worst pain 8/10; 4/3: 6/10 4/30: 7/10 tension headache     Time  2    Period  Weeks    Status  Partially Met        PT Long Term Goals - 04/18/18 0904      PT LONG TERM GOAL #1   Title  Patient will report a worst pain of 3/10 on VAS in cervical spine to improve tolerance with ADLs and reduced symptoms with  activities    Baseline  worst pain 8/10; 4/3: 6/10 4/30: 7/10 tension headache from caregiver.  Time  4    Period  Weeks    Status  Partially Met    Target Date  05/16/18      PT LONG TERM GOAL #2   Title  Patient will have no tingling in bilateral hands for two consecutive weeks to improve quality of life.     Baseline   4/3: had no tingling for about a week, came back this week 3x due to husband in the hospital. 4/30: 2x a week.     Time  4    Period  Weeks    Status  Partially Met    Target Date  05/16/18      PT LONG TERM GOAL #3   Title  Patient will decrease NDI score to 28% to decrease level of perceived disability to minimal.     Baseline  3/4: 46%; 4/3: 44%: 4/30: 54%    Time  4    Period  Weeks    Status  Partially Met    Target Date  05/16/18      PT LONG TERM GOAL #4   Title  Patient will improve cervical rotation ROM to 60 degrees bilaterally to improve functional mobility.     Baseline  3/4: R 39 L 41; 4/3: R 42 , L 46 4/30: supine L55 R 54     Time  4    Period  Weeks    Status  Partially Met    Target Date  05/16/18            Plan - 05/10/18 1015    Clinical Impression Statement  Patient has muscle tissue tension at superiormedial border of scapula with additional tightness of upper trapezius. Pain reduced after manual with patient verbalizing relief of tightness and pain. Patient will continue to benefit from skilled physical therapy to reduce pain, improve cervical mobility, and allow patient better QOL    Rehab Potential  Fair    Clinical Impairments Affecting Rehab Potential  + CAD testing, history of AAA, anemia, anxiety, asthma, CAD, chronic combined ystolic heart failure, GERD, Marfan syndrome, OA, RA (+) good work Psychologist, forensic, motivated, age     PT Frequency  2x / week    PT Duration  4 weeks    PT Treatment/Interventions  ADLs/Self Care Home Management;Cryotherapy;Traction;Ultrasound;Therapeutic exercise;Therapeutic activities;Neuromuscular  re-education;Patient/family education;Manual techniques;Passive range of motion;Dry needling;Energy conservation;Visual/perceptual remediation/compensation    PT Next Visit Plan  nerve glides, muscle tissue length, posture    PT Home Exercise Plan  give next session.    Consulted and Agree with Plan of Care  Patient       Patient will benefit from skilled therapeutic intervention in order to improve the following deficits and impairments:  Decreased activity tolerance, Decreased safety awareness, Decreased range of motion, Decreased mobility, Decreased strength, Impaired flexibility, Postural dysfunction, Improper body mechanics, Pain  Visit Diagnosis: Radiculopathy, cervical region  Cervicalgia  Abnormal posture     Problem List Patient Active Problem List   Diagnosis Date Noted  . Cough 09/08/2017  . Type 1 dissection of ascending aorta ([place]) 04/23/2017  . HPV (human papilloma virus) infection 12/01/2016  . Screening mammogram, encounter for 11/29/2016  . Estrogen deficiency 11/29/2016  . Encounter for routine gynecological examination 11/29/2016  . Grade III hemorrhoids 10/06/2016  . Tachycardia 08/14/2016  . Hemorrhoids 08/13/2016  . Atypical migraine 08/03/2016  . CAD (coronary artery disease)   . AAA (abdominal aortic aneurysm) ([place])   . Chronic combined systolic (congestive) and diastolic (congestive)  heart failure ([name])   . Hyperglycemia 02/11/2016  . Generalized anxiety disorder 12/08/2015  . Panic attacks 07/28/2015  . Sensorineural hearing loss 03/27/2013  . Vitamin D deficiency 07/03/2012  . H/O aortic dissection 05/12/2012  . Obesity (BMI 30.0-34.9) 11/21/2011  . Routine general medical examination at a health care facility 09/20/2011  . S/P AVR (aortic valve replacement) 01/07/2011  . CORONARY ARTERY BYPASS GRAFT, HX OF 01/07/2011  . Arthritis 12/20/2010  . H/O dissecting abdominal aortic aneurysm repair 08/24/2010  . IRRITABLE BOWEL SYNDROME 09/09/2009   . Obstructive sleep apnea 08/04/2009  . External hemorrhoid 06/26/2009  . Osteoporosis 01/09/2009  . Hyperlipidemia 07/26/2007  . Depression with anxiety 07/26/2007  . Essential hypertension 07/26/2007  . Allergic rhinitis 07/26/2007  . Asthma 07/26/2007  . GERD 07/26/2007  . Osteoarthritis 07/26/2007  . Ocular migraine 07/26/2007  . Mild intermittent asthma without complication 12/45/8099   Janna Arch, PT, DPT   05/10/2018, 10:16 AM  [place] MAIN [facility] SERVICES [address] [place], [place], [address] Phone: [phone]   Fax:  [phone]  Name: Rachel Torres MRN: [id number] Date of Birth: 05-24-1956

## 2018-05-11 DIAGNOSIS — H43813 Vitreous degeneration, bilateral: Secondary | ICD-10-CM | POA: Diagnosis not present

## 2018-05-11 DIAGNOSIS — H04123 Dry eye syndrome of bilateral lacrimal glands: Secondary | ICD-10-CM | POA: Diagnosis not present

## 2018-05-12 ENCOUNTER — Ambulatory Visit: Payer: Medicare HMO | Admitting: Physical Therapy

## 2018-05-12 ENCOUNTER — Encounter: Payer: Self-pay | Admitting: Physical Therapy

## 2018-05-12 DIAGNOSIS — M5412 Radiculopathy, cervical region: Secondary | ICD-10-CM | POA: Diagnosis not present

## 2018-05-12 DIAGNOSIS — M542 Cervicalgia: Secondary | ICD-10-CM | POA: Diagnosis not present

## 2018-05-12 DIAGNOSIS — R293 Abnormal posture: Secondary | ICD-10-CM

## 2018-05-12 NOTE — Therapy (Signed)
[place] MAIN [facility] SERVICES [address] [place], [place], [address] Phone: [phone]   Fax:  [phone]  Physical Therapy Treatment  Patient Details  Name: Rachel Torres MRN: [id number] Date of Birth: [birth year] Referring Provider: Mallie Mussel pool   Encounter Date: 05/12/2018  PT End of Session - 05/12/18 0800    Visit Number  17    Number of Visits  19    Date for PT Re-Evaluation  05/16/18    PT Start Time  0800    PT Stop Time  9758 pt not charged for time to remove tick    PT Time Calculation (min)  50 min    Activity Tolerance  Treatment limited secondary to medical complications (Comment);Patient tolerated treatment well    Behavior During Therapy  [facility] for tasks assessed/performed       Past Medical History:  Diagnosis Date  . AAA (abdominal aortic aneurysm) ([place])    a. Chronic w/o evidence of aneurysmal dil on CTA 01/2016.  Marland Kitchen Allergy   . Anemia   . Anxiety   . Asthma   . CAD (coronary artery disease)    a. 08/2010 s/p CABG x 1 (VG->RCA) @ time of Ao dissection repair; b. 01/2016 Lexiscan MV: mid antsept/apical defect w/ ? peri-infarct ischemia-->likely attenuation-->Med Rx.  . Chronic combined systolic (congestive) and diastolic (congestive) heart failure ([place])    a. 2012 EF 30-35%; b. 04/2015 EF 40-45%; c. 01/2016 Echo: Ef 55-65%, nl AoV bioprosthesis, nl RV, nl PASP.  Marland Kitchen Depression   . Gallbladder sludge   . GERD (gastroesophageal reflux disease)   . History of Bicuspid Aortic Valve    a. 08/2010 s/p AVR @ time of Ao dissection repair; b. 01/2016 Echo: Ef 55-65%, nl AoV bioprosthesis, nl RV, nl PASP.  Marland Kitchen Hx of repair of dissecting thoracic aortic aneurysm, Stanford type A    a. 08/2010 s/p repair with AVR and VG->RCA; b. 01/2016 CTA: stable appearance of Asc Thoracic Aortic graft. Opacification of flase lumen of chronic abd Ao dissection w/ retrograde flow through lumbar arteries. No aneurysmal dil of flase lumen.  .  Hyperlipidemia   . Hypertensive heart disease   . IBS (irritable bowel syndrome)   . Internal hemorrhoids   . Marfan syndrome    pt denies  . Migraine   . Obstructive sleep apnea   . Osteoporosis   . Pneumonia   . RA (rheumatoid arthritis) ([place])    a. Followed by Dr. [name]    Past Surgical History:  Procedure Laterality Date  . ABDOMINAL AORTIC ANEURYSM REPAIR    . CARDIAC CATHETERIZATION  2001  . [place]  . CHOLECYSTECTOMY    . COLONOSCOPY    . FRACTURE SURGERY Right    shoulder replacement  . HEMORRHOID BANDING    . ORIF DISTAL RADIUS FRACTURE Left   . UPPER GASTROINTESTINAL ENDOSCOPY      There were no vitals filed for this visit.  Subjective Assessment - 05/12/18 0805    Subjective  Pt reports she is sad because she might have to put her cat down today.  Pt has been trying to remember to keep her shoulders down and back.      Pertinent History  Patient is a pleasant 62 year old female who presents with cervical neck pain with radiating symptoms to bilateral fingers. 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  Has high stress levels due to  caregiver support. Patient has gained 30 lb.  Has AAA and numbness began seven years ago when aneurysm occured. Sleeping with a neck pillow to relieve pain. Pain has been occurring for years.     Limitations  Sitting;Lifting;Standing;Walking;Writing;House hold activities;Other (comment);Reading    How long can you sit comfortably?  an hour     How long can you stand comfortably?  n/a    How long can you walk comfortably?  n/a    Diagnostic tests  n/a    Patient Stated Goals  decrease pain.     Currently in Pain?  Yes    Pain Score  1     Pain Location  Neck    Pain Orientation  Right    Pain Descriptors / Indicators  Aching    Pain Type  Chronic pain    Pain Onset  More than a month ago        TREATMENT   Vitals taken at start of session in sitting: BP 122/60,  SpO2 96%, pulse 76   Sidebending with overpressure stretch 5x 20 seconds with R more tight than L   Rotation L and R with overpressure stretch?5x20 seconds with R more tight than L  Suboccipital release 4x 20 seconds   ULT median nerve glide supine 10x RUE   ULT radial nerve glide supine 10x RUE?   Grade I-III CPA and UPAs c2-T1?hypomobile throughout with pain relief upon L C4; 2x30 seconds each level   STM upper thoracic paraspinal; more tightness noted on the L in prone   Seated scapular retractions 20x                         PT Education - 05/12/18 0800    Education provided  Yes    Education Details  Exercise technique; reasoning behind interventions    Person(s) Educated  Patient    Methods  Explanation;Demonstration;Verbal cues    Comprehension  Verbalized understanding;Returned demonstration;Verbal cues required;Need further instruction       PT Short Term Goals - 04/18/18 0903      PT SHORT TERM GOAL #1   Title  Patient will be independent in home exercise program to improve strength/mobility for better functional independence with ADLs.    Baseline  HEP compliance    Time  2    Period  Weeks    Status  Achieved      PT SHORT TERM GOAL #2   Title  Patient will report a worst pain of 5/10 on VAS in neck to improve tolerance with ADLs and reduced symptoms with activities.     Baseline  3/4: worst pain 8/10; 4/3: 6/10 4/30: 7/10 tension headache     Time  2    Period  Weeks    Status  Partially Met        PT Long Term Goals - 04/18/18 0904      PT LONG TERM GOAL #1   Title  Patient will report a worst pain of 3/10 on VAS in cervical spine to improve tolerance with ADLs and reduced symptoms with activities    Baseline  worst pain 8/10; 4/3: 6/10 4/30: 7/10 tension headache from caregiver.     Time  4    Period  Weeks    Status  Partially Met    Target Date  05/16/18      PT LONG TERM GOAL #2   Title  Patient will have no tingling in  bilateral hands for two consecutive weeks to improve quality of life.     Baseline   4/3: had no tingling for about a week, came back this week 3x due to husband in the hospital. 4/30: 2x a week.     Time  4    Period  Weeks    Status  Partially Met    Target Date  05/16/18      PT LONG TERM GOAL #3   Title  Patient will decrease NDI score to 28% to decrease level of perceived disability to minimal.     Baseline  3/4: 46%; 4/3: 44%: 4/30: 54%    Time  4    Period  Weeks    Status  Partially Met    Target Date  05/16/18      PT LONG TERM GOAL #4   Title  Patient will improve cervical rotation ROM to 60 degrees bilaterally to improve functional mobility.     Baseline  3/4: R 39 L 41; 4/3: R 42 , L 46 4/30: supine L55 R 54     Time  4    Period  Weeks    Status  Partially Met    Target Date  05/16/18            Plan - 05/12/18 0808    Clinical Impression Statement  Pt continues to demonstrate trigger points and increased tension in suboccipital and upper thoracic paraspinals. Pt responded well to STM to upper thoracic region, especially on the L, and reported some relief with this.  Tick found on pt's L upper back which was removed by pt's husband, pt kept tick in plastic bag and will call MD.  Pt will benefit from continued skilled PT interventions for decreased pain and improved QOL.      Rehab Potential  Fair    Clinical Impairments Affecting Rehab Potential  + CAD testing, history of AAA, anemia, anxiety, asthma, CAD, chronic combined ystolic heart failure, GERD, Marfan syndrome, OA, RA (+) good work Psychologist, forensic, motivated, age     PT Frequency  2x / week    PT Duration  4 weeks    PT Treatment/Interventions  ADLs/Self Care Home Management;Cryotherapy;Traction;Ultrasound;Therapeutic exercise;Therapeutic activities;Neuromuscular re-education;Patient/family education;Manual techniques;Passive range of motion;Dry needling;Energy conservation;Visual/perceptual remediation/compensation     PT Next Visit Plan  nerve glides, muscle tissue length, posture    PT Home Exercise Plan  give next session.    Consulted and Agree with Plan of Care  Patient       Patient will benefit from skilled therapeutic intervention in order to improve the following deficits and impairments:  Decreased activity tolerance, Decreased safety awareness, Decreased range of motion, Decreased mobility, Decreased strength, Impaired flexibility, Postural dysfunction, Improper body mechanics, Pain  Visit Diagnosis: Radiculopathy, cervical region  Cervicalgia  Abnormal posture     Problem List Patient Active Problem List   Diagnosis Date Noted  . Cough 09/08/2017  . Type 1 dissection of ascending aorta ([place]) 04/23/2017  . HPV (human papilloma virus) infection 12/01/2016  . Screening mammogram, encounter for 11/29/2016  . Estrogen deficiency 11/29/2016  . Encounter for routine gynecological examination 11/29/2016  . Grade III hemorrhoids 10/06/2016  . Tachycardia 08/14/2016  . Hemorrhoids 08/13/2016  . Atypical migraine 08/03/2016  . CAD (coronary artery disease)   . AAA (abdominal aortic aneurysm) ([place])   . Chronic combined systolic (congestive) and diastolic (congestive) heart failure ([place])   . Hyperglycemia 02/11/2016  . Generalized anxiety disorder 12/08/2015  .  Panic attacks 07/28/2015  . Sensorineural hearing loss 03/27/2013  . Vitamin D deficiency 07/03/2012  . H/O aortic dissection 05/12/2012  . Obesity (BMI 30.0-34.9) 11/21/2011  . Routine general medical examination at a health care facility 09/20/2011  . S/P AVR (aortic valve replacement) 01/07/2011  . CORONARY ARTERY BYPASS GRAFT, HX OF 01/07/2011  . Arthritis 12/20/2010  . H/O dissecting abdominal aortic aneurysm repair 08/24/2010  . IRRITABLE BOWEL SYNDROME 09/09/2009  . Obstructive sleep apnea 08/04/2009  . External hemorrhoid 06/26/2009  . Osteoporosis 01/09/2009  . Hyperlipidemia 07/26/2007  . Depression with anxiety  07/26/2007  . Essential hypertension 07/26/2007  . Allergic rhinitis 07/26/2007  . Asthma 07/26/2007  . GERD 07/26/2007  . Osteoarthritis 07/26/2007  . Ocular migraine 07/26/2007  . Mild intermittent asthma without complication 74/14/2395    Collie Siad PT, DPT 05/12/2018, 9:06 AM  [place] MAIN [facility] SERVICES [address] [place], [place], [address] Phone: [phone]   Fax:  [phone]  Name: Rachel Torres MRN: [id number] Date of Birth: 07-16-1956

## 2018-05-16 ENCOUNTER — Ambulatory Visit (INDEPENDENT_AMBULATORY_CARE_PROVIDER_SITE_OTHER): Payer: Medicare HMO | Admitting: Psychology

## 2018-05-16 DIAGNOSIS — F4323 Adjustment disorder with mixed anxiety and depressed mood: Secondary | ICD-10-CM

## 2018-05-18 ENCOUNTER — Ambulatory Visit: Payer: Medicare HMO

## 2018-05-18 DIAGNOSIS — R293 Abnormal posture: Secondary | ICD-10-CM | POA: Diagnosis not present

## 2018-05-18 DIAGNOSIS — M542 Cervicalgia: Secondary | ICD-10-CM

## 2018-05-18 DIAGNOSIS — M5412 Radiculopathy, cervical region: Secondary | ICD-10-CM | POA: Diagnosis not present

## 2018-05-18 DIAGNOSIS — Z6835 Body mass index (BMI) 35.0-35.9, adult: Secondary | ICD-10-CM | POA: Diagnosis not present

## 2018-05-18 NOTE — Therapy (Signed)
[place] MAIN [facility] SERVICES [address] [place], [place], [address] Phone: [phone]   Fax:  [phone]  Physical Therapy Treatment  Patient Details  Name: Rachel Torres MRN: [id number] Date of Birth: 12-Dec-1956 Referring Provider: Mallie Mussel pool   Encounter Date: 05/18/2018  Rachel Torres End of Session - 05/18/18 0809    Visit Number  18    Number of Visits  26    Date for Rachel Torres Re-Evaluation  06/15/18    Rachel Torres Start Time  0820    Rachel Torres Stop Time  0911    Rachel Torres Time Calculation (min)  51 min    Activity Tolerance  Treatment limited secondary to medical complications (Comment);Patient tolerated treatment well    Behavior During Therapy  [facility] for tasks assessed/performed       Past Medical History:  Diagnosis Date  . AAA (abdominal aortic aneurysm) ([place])    a. Chronic w/o evidence of aneurysmal dil on CTA 01/2016.  Marland Kitchen Allergy   . Anemia   . Anxiety   . Asthma   . CAD (coronary artery disease)    a. 08/2010 s/p CABG x 1 (VG->RCA) @ time of Ao dissection repair; b. 01/2016 Lexiscan MV: mid antsept/apical defect w/ ? peri-infarct ischemia-->likely attenuation-->Med Rx.  . Chronic combined systolic (congestive) and diastolic (congestive) heart failure ([place])    a. 2012 EF 30-35%; b. 04/2015 EF 40-45%; c. 01/2016 Echo: Ef 55-65%, nl AoV bioprosthesis, nl RV, nl PASP.  Marland Kitchen Depression   . Gallbladder sludge   . GERD (gastroesophageal reflux disease)   . History of Bicuspid Aortic Valve    a. 08/2010 s/p AVR @ time of Ao dissection repair; b. 01/2016 Echo: Ef 55-65%, nl AoV bioprosthesis, nl RV, nl PASP.  Marland Kitchen Hx of repair of dissecting thoracic aortic aneurysm, Stanford type A    a. 08/2010 s/p repair with AVR and VG->RCA; b. 01/2016 CTA: stable appearance of Asc Thoracic Aortic graft. Opacification of flase lumen of chronic abd Ao dissection w/ retrograde flow through lumbar arteries. No aneurysmal dil of flase lumen.  . Hyperlipidemia   . Hypertensive heart disease   .  IBS (irritable bowel syndrome)   . Internal hemorrhoids   . Marfan syndrome    Rachel Torres denies  . Migraine   . Obstructive sleep apnea   . Osteoporosis   . Pneumonia   . RA (rheumatoid arthritis) ([place])    a. Followed by Dr. [name]    Past Surgical History:  Procedure Laterality Date  . ABDOMINAL AORTIC ANEURYSM REPAIR    . CARDIAC CATHETERIZATION  2001  . [place]  . CHOLECYSTECTOMY    . COLONOSCOPY    . FRACTURE SURGERY Right    shoulder replacement  . HEMORRHOID BANDING    . ORIF DISTAL RADIUS FRACTURE Left   . UPPER GASTROINTESTINAL ENDOSCOPY      There were no vitals filed for this visit.  Subjective Assessment - 05/18/18 0916    Subjective  Patient reports compliance with HEP. Occurances of high pain has decreased to 2x/week and only occur when she is lifting her husband from the floor.     Pertinent History  Patient is a pleasant 62 year old female who presents with cervical neck pain with radiating symptoms to bilateral fingers. 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  Has high stress levels due to caregiver support. Patient has gained 30 lb.  Has AAA and numbness began  seven years ago when aneurysm occured. Sleeping with a neck pillow to relieve pain. Pain has been occurring for years.     Limitations  Sitting;Lifting;Standing;Walking;Writing;House hold activities;Other (comment);Reading    How long can you sit comfortably?  an hour     How long can you stand comfortably?  n/a    How long can you walk comfortably?  n/a    Diagnostic tests  n/a    Patient Stated Goals  decrease pain.     Currently in Pain?  Yes    Pain Score  1     Pain Location  Neck    Pain Orientation  Posterior    Pain Descriptors / Indicators  Aching    Pain Type  Chronic pain    Pain Onset  More than a month ago    Pain Frequency  Constant       VAS current 1/10; 7/10 at worst when lifting him: average 2/10    Pain was daily,  now is only once or twice a week.     Sidebending with overpressure stretch 4x 20 seconds with R more tight than L    Rotation L and R with overpressure stretch?4x20 seconds with R more tight than L   Suboccipital release 4x 20 seconds    ULT median nerve glide supine 10x RUE    ULT radial nerve glide supine 10x RUE?   Grade I-III CPA and UPAs c2-T8?hypomobile throughout with pain relief upon L C4; 5x10 seconds each level    J mobilization to upper thoracic 3x15 seconds  STM upper thoracic paraspinal; more tightness noted on the L in prone     Trigger point/soft tissue release with movement to improve soft tissue limited muscle tissue length. 5 minutes   Seated scapular retractions 20x    Rachel Torres. response to medical necessity:  Patient will continue to benefit from skilled physical therapy to reduce pain, improve cervical mobility, and allow patient better QOL      Patient's condition has the potential to improve in response to therapy. Maximum improvement is yet to be obtained. The anticipated improvement is attainable and reasonable in a generally predictable time. Start date of reporting period 05/18/09 end date of reporting period 06/15/18 Patient reports                   Rachel Torres Education - 05/18/18 0808    Education provided  Yes    Education Details  POC, HEP, manual    Person(s) Educated  Patient    Methods  Explanation;Verbal cues    Comprehension  Verbalized understanding       Rachel Torres Short Term Goals - 05/18/18 0824      Rachel Torres SHORT TERM GOAL #1   Title  Patient will be independent in home exercise program to improve strength/mobility for better functional independence with ADLs.    Baseline  HEP compliance    Time  2    Period  Weeks    Status  Achieved      Rachel Torres SHORT TERM GOAL #2   Title  Patient will report a worst pain of 5/10 on VAS in neck to improve tolerance with ADLs and reduced symptoms with activities.     Baseline  3/4: worst pain 8/10; 4/3:  6/10 4/30: 7/10 tension headache 5/30 7/10 when lifting husband from floor    Time  2    Period  Weeks    Status  Partially Met    Target Date  06/01/18        Rachel Torres Long Term Goals - 05/18/18 0825      Rachel Torres LONG TERM GOAL #1   Title  Patient will report a worst pain of 3/10 on VAS in cervical spine to improve tolerance with ADLs and reduced symptoms with activities    Baseline  worst pain 8/10; 4/3: 6/10 4/30: 7/10 tension headache from caregiver. 5/30: 7/10 from lifting husband    Time  4    Period  Weeks    Status  Partially Met      Rachel Torres LONG TERM GOAL #2   Title  Patient will have no tingling in bilateral hands for two consecutive weeks to improve quality of life.     Baseline   4/3: had no tingling for about a week, came back this week 3x due to husband in the hospital. 4/30: 2x a week. 5/30: no tingling.     Time  4    Period  Weeks    Status  Achieved      Rachel Torres LONG TERM GOAL #3   Title  Patient will decrease NDI score to 28% to decrease level of perceived disability to minimal.     Baseline  3/4: 46%; 4/3: 44%: 4/30: 54% 5/30: 40%     Time  4    Period  Weeks    Status  Partially Met    Target Date  06/15/18      Rachel Torres LONG TERM GOAL #4   Title  Patient will improve cervical rotation ROM to 60 degrees bilaterally to improve functional mobility.     Baseline  3/4: R 39 L 41; 4/3: R 42 , L 46 4/30: supine L55 R 54 ; 5/30: seated L 55 R 56    Time  4    Period  Weeks    Status  Partially Met    Target Date  06/15/18      Rachel Torres LONG TERM GOAL #5   Title  Patient will have reduced occurances of high levels of pain to improve quality of life    Baseline  initially was everyday now is 2x/week    Time  4    Period  Weeks    Status  New            Plan - 05/18/18 3419    Clinical Impression Statement   Patient improving in ROM and quality of life with reduced episodes of high pain levels. Pain occurs now only 2x a week, when lifting husband from floor as she is a caregiver to  both husband and mother. Neurological tingling in bilateral hands has reduced with no episodes of tingling in past two weeks. Although patient is compliant with HEP, progress is slow due to caregiver duties. NDI improved to 40% demonstrating a 14% improvement. Patient's condition has the potential to improve in response to therapy. Maximum improvement is yet to be obtained. The anticipated improvement is attainable and reasonable in a generally predictable timePatient will continue to benefit from skilled physical therapy to reduce pain, improve cervical mobility, and allow patient better QOL    Rehab Potential  Fair    Clinical Impairments Affecting Rehab Potential  + CAD testing, history of AAA, anemia, anxiety, asthma, CAD, chronic combined ystolic heart failure, GERD, Marfan syndrome, OA, RA (+) good work Psychologist, forensic, motivated, age     Rachel Torres Frequency  2x / week    Rachel Torres Duration  4 weeks    Rachel Torres Treatment/Interventions  ADLs/Self  Care Home Management;Cryotherapy;Traction;Ultrasound;Therapeutic exercise;Therapeutic activities;Neuromuscular re-education;Patient/family education;Manual techniques;Passive range of motion;Dry needling;Energy conservation;Visual/perceptual remediation/compensation    Rachel Torres Next Visit Plan  nerve glides, muscle tissue length, posture    Rachel Torres Home Exercise Plan  give next session.    Consulted and Agree with Plan of Care  Patient       Patient will benefit from skilled therapeutic intervention in order to improve the following deficits and impairments:  Decreased activity tolerance, Decreased safety awareness, Decreased range of motion, Decreased mobility, Decreased strength, Impaired flexibility, Postural dysfunction, Improper body mechanics, Pain  Visit Diagnosis: Radiculopathy, cervical region  Cervicalgia  Abnormal posture     Problem List Patient Active Problem List   Diagnosis Date Noted  . Cough 09/08/2017  . Type 1 dissection of ascending aorta ([place]) 04/23/2017  .  HPV (human papilloma virus) infection 12/01/2016  . Screening mammogram, encounter for 11/29/2016  . Estrogen deficiency 11/29/2016  . Encounter for routine gynecological examination 11/29/2016  . Grade III hemorrhoids 10/06/2016  . Tachycardia 08/14/2016  . Hemorrhoids 08/13/2016  . Atypical migraine 08/03/2016  . CAD (coronary artery disease)   . AAA (abdominal aortic aneurysm) ([place])   . Chronic combined systolic (congestive) and diastolic (congestive) heart failure ([place])   . Hyperglycemia 02/11/2016  . Generalized anxiety disorder 12/08/2015  . Panic attacks 07/28/2015  . Sensorineural hearing loss 03/27/2013  . Vitamin D deficiency 07/03/2012  . H/O aortic dissection 05/12/2012  . Obesity (BMI 30.0-34.9) 11/21/2011  . Routine general medical examination at a health care facility 09/20/2011  . S/P AVR (aortic valve replacement) 01/07/2011  . CORONARY ARTERY BYPASS GRAFT, HX OF 01/07/2011  . Arthritis 12/20/2010  . H/O dissecting abdominal aortic aneurysm repair 08/24/2010  . IRRITABLE BOWEL SYNDROME 09/09/2009  . Obstructive sleep apnea 08/04/2009  . External hemorrhoid 06/26/2009  . Osteoporosis 01/09/2009  . Hyperlipidemia 07/26/2007  . Depression with anxiety 07/26/2007  . Essential hypertension 07/26/2007  . Allergic rhinitis 07/26/2007  . Asthma 07/26/2007  . GERD 07/26/2007  . Osteoarthritis 07/26/2007  . Ocular migraine 07/26/2007  . Mild intermittent asthma without complication 51/09/2110   Rachel Torres, Rachel Torres, Rachel Torres   05/18/2018, 9:19 AM  [place] MAIN [facility] SERVICES [address] [place], [place], [address] Phone: [phone]   Fax:  [phone]  Name: Rachel Torres MRN: [id number] Date of Birth: 03-07-1956

## 2018-05-22 ENCOUNTER — Ambulatory Visit (INDEPENDENT_AMBULATORY_CARE_PROVIDER_SITE_OTHER): Payer: Medicare HMO | Admitting: Psychology

## 2018-05-22 ENCOUNTER — Ambulatory Visit: Payer: Medicare HMO | Attending: Neurosurgery

## 2018-05-22 DIAGNOSIS — M542 Cervicalgia: Secondary | ICD-10-CM | POA: Diagnosis not present

## 2018-05-22 DIAGNOSIS — M5412 Radiculopathy, cervical region: Secondary | ICD-10-CM | POA: Diagnosis not present

## 2018-05-22 DIAGNOSIS — R293 Abnormal posture: Secondary | ICD-10-CM | POA: Insufficient documentation

## 2018-05-22 DIAGNOSIS — F4323 Adjustment disorder with mixed anxiety and depressed mood: Secondary | ICD-10-CM

## 2018-05-22 NOTE — Therapy (Signed)
[place] MAIN [facility] SERVICES [address] [place], [place], [address] Phone: [phone]   Fax:  [phone]  Physical Therapy Treatment  Patient Details  Name: Rachel Torres MRN: [id number] Date of Birth: [birth year] Referring Provider: Mallie Mussel pool   Encounter Date: 05/22/2018  PT End of Session - 05/22/18 0840    Visit Number  19    Number of Visits  26    Date for PT Re-Evaluation  06/15/18    PT Start Time  0844    PT Stop Time  0930    PT Time Calculation (min)  46 min    Activity Tolerance  Treatment limited secondary to medical complications (Comment);Patient tolerated treatment well    Behavior During Therapy  [facility] for tasks assessed/performed       Past Medical History:  Diagnosis Date  . AAA (abdominal aortic aneurysm) ([place])    a. Chronic w/o evidence of aneurysmal dil on CTA 01/2016.  Marland Kitchen Allergy   . Anemia   . Anxiety   . Asthma   . CAD (coronary artery disease)    a. 08/2010 s/p CABG x 1 (VG->RCA) @ time of Ao dissection repair; b. 01/2016 Lexiscan MV: mid antsept/apical defect w/ ? peri-infarct ischemia-->likely attenuation-->Med Rx.  . Chronic combined systolic (congestive) and diastolic (congestive) heart failure ([place])    a. 2012 EF 30-35%; b. 04/2015 EF 40-45%; c. 01/2016 Echo: Ef 55-65%, nl AoV bioprosthesis, nl RV, nl PASP.  Marland Kitchen Depression   . Gallbladder sludge   . GERD (gastroesophageal reflux disease)   . History of Bicuspid Aortic Valve    a. 08/2010 s/p AVR @ time of Ao dissection repair; b. 01/2016 Echo: Ef 55-65%, nl AoV bioprosthesis, nl RV, nl PASP.  Marland Kitchen Hx of repair of dissecting thoracic aortic aneurysm, Stanford type A    a. 08/2010 s/p repair with AVR and VG->RCA; b. 01/2016 CTA: stable appearance of Asc Thoracic Aortic graft. Opacification of flase lumen of chronic abd Ao dissection w/ retrograde flow through lumbar arteries. No aneurysmal dil of flase lumen.  . Hyperlipidemia   . Hypertensive heart disease   .  IBS (irritable bowel syndrome)   . Internal hemorrhoids   . Marfan syndrome    pt denies  . Migraine   . Obstructive sleep apnea   . Osteoporosis   . Pneumonia   . RA (rheumatoid arthritis) ([place])    a. Followed by Dr. [name]    Past Surgical History:  Procedure Laterality Date  . ABDOMINAL AORTIC ANEURYSM REPAIR    . CARDIAC CATHETERIZATION  2001  . [place]  . CHOLECYSTECTOMY    . COLONOSCOPY    . FRACTURE SURGERY Right    shoulder replacement  . HEMORRHOID BANDING    . ORIF DISTAL RADIUS FRACTURE Left   . UPPER GASTROINTESTINAL ENDOSCOPY      There were no vitals filed for this visit.  Subjective Assessment - 05/22/18 0847    Subjective  Patient notes more pain in the evenings, with a focus on the right side. Has no pain right now. Went to doctor Thursday, reports dotor will add orders for thoracic spine. Reports happy that it is working, but if it doesn't than surgery will be the corrective action.     Pertinent History  Patient is a pleasant 62 year old female who presents with cervical neck pain with radiating symptoms to bilateral fingers. 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  Has high stress levels due to caregiver support. Patient has gained 30 lb.  Has AAA and numbness began seven years ago when aneurysm occured. Sleeping with a neck pillow to relieve pain. Pain has been occurring for years.     Limitations  Sitting;Lifting;Standing;Walking;Writing;House hold activities;Other (comment);Reading    How long can you sit comfortably?  an hour     How long can you stand comfortably?  n/a    How long can you walk comfortably?  n/a    Diagnostic tests  n/a    Patient Stated Goals  decrease pain.     Currently in Pain?  No/denies        Sidebending with overpressure stretch 5x 20 seconds with R more tight than L Rotation l and R with overpressure stretch 5x20 seconds with R more tight than L.   Suboccipital release 4x 20 seconds    Grade I-III CPA and UPAs c2-T1 hypomobile throughout with pain relief upon L C4 ; 4x10 seconds each level Thoracic J sweep against thoracic curvature mobilizations grade I-III 5x10 seconds      ULT median nerve glide supine 10x RUE ULT radial nerve glide supine 10x RUE       STM cervical and upper paraspinal; tighter on R cervical and upper thoracic region in seated position with head on table 7 minutes   Trigger point/soft tissue release with movement to improve soft tissue limited muscle tissue length. 5 minutes   Seated scapular retractions 20x    Pt. response to medical necessity:  Patient will continue to benefit from skilled physical therapy to reduce pain, improve cervical mobility, and allow patient better QOL                     PT Education - 05/22/18 0838    Education provided  Yes    Education Details  POC, manual     Person(s) Educated  Patient    Methods  Explanation;Demonstration;Verbal cues    Comprehension  Verbalized understanding       PT Short Term Goals - 05/18/18 0824      PT SHORT TERM GOAL #1   Title  Patient will be independent in home exercise program to improve strength/mobility for better functional independence with ADLs.    Baseline  HEP compliance    Time  2    Period  Weeks    Status  Achieved      PT SHORT TERM GOAL #2   Title  Patient will report a worst pain of 5/10 on VAS in neck to improve tolerance with ADLs and reduced symptoms with activities.     Baseline  3/4: worst pain 8/10; 4/3: 6/10 4/30: 7/10 tension headache 5/30 7/10 when lifting husband from floor    Time  2    Period  Weeks    Status  Partially Met    Target Date  06/01/18        PT Long Term Goals - 05/18/18 0825      PT LONG TERM GOAL #1   Title  Patient will report a worst pain of 3/10 on VAS in cervical spine to improve tolerance with ADLs and reduced symptoms with activities    Baseline  worst pain  8/10; 4/3: 6/10 4/30: 7/10 tension headache from caregiver. 5/30: 7/10 from lifting husband    Time  4    Period  Weeks    Status  Partially Met  PT LONG TERM GOAL #2   Title  Patient will have no tingling in bilateral hands for two consecutive weeks to improve quality of life.     Baseline   4/3: had no tingling for about a week, came back this week 3x due to husband in the hospital. 4/30: 2x a week. 5/30: no tingling.     Time  4    Period  Weeks    Status  Achieved      PT LONG TERM GOAL #3   Title  Patient will decrease NDI score to 28% to decrease level of perceived disability to minimal.     Baseline  3/4: 46%; 4/3: 44%: 4/30: 54% 5/30: 40%     Time  4    Period  Weeks    Status  Partially Met    Target Date  06/15/18      PT LONG TERM GOAL #4   Title  Patient will improve cervical rotation ROM to 60 degrees bilaterally to improve functional mobility.     Baseline  3/4: R 39 L 41; 4/3: R 42 , L 46 4/30: supine L55 R 54 ; 5/30: seated L 55 R 56    Time  4    Period  Weeks    Status  Partially Met    Target Date  06/15/18      PT LONG TERM GOAL #5   Title  Patient will have reduced occurances of high levels of pain to improve quality of life    Baseline  initially was everyday now is 2x/week    Time  4    Period  Weeks    Status  New            Plan - 05/22/18 0930    Clinical Impression Statement  Patient presents with increased muscle tension at superior-medial scapular border with noted tenderness to palpation and STM with repetition. Repeated tissue manipulation resulted in improved length and reduced tension in headache region per patient report. Patient will continue to benefit from skilled physical therapy to reduce pain, improve cervical mobility, and allow patient better QOL    Rehab Potential  Fair    Clinical Impairments Affecting Rehab Potential  + CAD testing, history of AAA, anemia, anxiety, asthma, CAD, chronic combined ystolic heart failure, GERD,  Marfan syndrome, OA, RA (+) good work Psychologist, forensic, motivated, age     PT Frequency  2x / week    PT Duration  4 weeks    PT Treatment/Interventions  ADLs/Self Care Home Management;Cryotherapy;Traction;Ultrasound;Therapeutic exercise;Therapeutic activities;Neuromuscular re-education;Patient/family education;Manual techniques;Passive range of motion;Dry needling;Energy conservation;Visual/perceptual remediation/compensation    PT Next Visit Plan  nerve glides, muscle tissue length, posture    PT Home Exercise Plan  give next session.    Consulted and Agree with Plan of Care  Patient       Patient will benefit from skilled therapeutic intervention in order to improve the following deficits and impairments:  Decreased activity tolerance, Decreased safety awareness, Decreased range of motion, Decreased mobility, Decreased strength, Impaired flexibility, Postural dysfunction, Improper body mechanics, Pain  Visit Diagnosis: Radiculopathy, cervical region  Cervicalgia  Abnormal posture     Problem List Patient Active Problem List   Diagnosis Date Noted  . Cough 09/08/2017  . Type 1 dissection of ascending aorta ([place]) 04/23/2017  . HPV (human papilloma virus) infection 12/01/2016  . Screening mammogram, encounter for 11/29/2016  . Estrogen deficiency 11/29/2016  . Encounter for routine gynecological examination 11/29/2016  .  Grade III hemorrhoids 10/06/2016  . Tachycardia 08/14/2016  . Hemorrhoids 08/13/2016  . Atypical migraine 08/03/2016  . CAD (coronary artery disease)   . AAA (abdominal aortic aneurysm) ([place])   . Chronic combined systolic (congestive) and diastolic (congestive) heart failure ([place])   . Hyperglycemia 02/11/2016  . Generalized anxiety disorder 12/08/2015  . Panic attacks 07/28/2015  . Sensorineural hearing loss 03/27/2013  . Vitamin D deficiency 07/03/2012  . H/O aortic dissection 05/12/2012  . Obesity (BMI 30.0-34.9) 11/21/2011  . Routine general medical examination  at a health care facility 09/20/2011  . S/P AVR (aortic valve replacement) 01/07/2011  . CORONARY ARTERY BYPASS GRAFT, HX OF 01/07/2011  . Arthritis 12/20/2010  . H/O dissecting abdominal aortic aneurysm repair 08/24/2010  . IRRITABLE BOWEL SYNDROME 09/09/2009  . Obstructive sleep apnea 08/04/2009  . External hemorrhoid 06/26/2009  . Osteoporosis 01/09/2009  . Hyperlipidemia 07/26/2007  . Depression with anxiety 07/26/2007  . Essential hypertension 07/26/2007  . Allergic rhinitis 07/26/2007  . Asthma 07/26/2007  . GERD 07/26/2007  . Osteoarthritis 07/26/2007  . Ocular migraine 07/26/2007  . Mild intermittent asthma without complication 83/50/7573   Janna Arch, PT, DPT   05/22/2018, 9:31 AM  [place] MAIN [facility] SERVICES [address] [place], [place], [address] Phone: [phone]   Fax:  [phone]  Name: Rachel Torres MRN: [id number] Date of Birth: [birth year]

## 2018-05-24 ENCOUNTER — Other Ambulatory Visit: Payer: Self-pay | Admitting: Family Medicine

## 2018-05-24 ENCOUNTER — Other Ambulatory Visit: Payer: Self-pay | Admitting: Cardiovascular Disease

## 2018-05-29 ENCOUNTER — Ambulatory Visit: Payer: Medicare HMO

## 2018-05-29 ENCOUNTER — Ambulatory Visit (INDEPENDENT_AMBULATORY_CARE_PROVIDER_SITE_OTHER): Payer: Medicare HMO | Admitting: Psychology

## 2018-05-29 DIAGNOSIS — F4323 Adjustment disorder with mixed anxiety and depressed mood: Secondary | ICD-10-CM | POA: Diagnosis not present

## 2018-05-29 DIAGNOSIS — M542 Cervicalgia: Secondary | ICD-10-CM

## 2018-05-29 DIAGNOSIS — R293 Abnormal posture: Secondary | ICD-10-CM | POA: Diagnosis not present

## 2018-05-29 DIAGNOSIS — M5412 Radiculopathy, cervical region: Secondary | ICD-10-CM

## 2018-05-29 NOTE — Therapy (Signed)
[place] MAIN [facility] SERVICES [address] [place], [place], [address] Phone: [phone]   Fax:  [phone]  Physical Therapy Treatment  Patient Details  Name: Rachel Torres MRN: [id number] Date of Birth: [birth year] Referring Provider: Mallie Mussel pool   Encounter Date: 05/29/2018  PT End of Session - 05/29/18 0850    Visit Number  20    Number of Visits  26    Date for PT Re-Evaluation  06/15/18    PT Start Time  0846    PT Stop Time  0930    PT Time Calculation (min)  44 min    Activity Tolerance  Treatment limited secondary to medical complications (Comment);Patient tolerated treatment well    Behavior During Therapy  [facility] for tasks assessed/performed       Past Medical History:  Diagnosis Date  . AAA (abdominal aortic aneurysm) ([place])    a. Chronic w/o evidence of aneurysmal dil on CTA 01/2016.  Marland Kitchen Allergy   . Anemia   . Anxiety   . Asthma   . CAD (coronary artery disease)    a. 08/2010 s/p CABG x 1 (VG->RCA) @ time of Ao dissection repair; b. 01/2016 Lexiscan MV: mid antsept/apical defect w/ ? peri-infarct ischemia-->likely attenuation-->Med Rx.  . Chronic combined systolic (congestive) and diastolic (congestive) heart failure ([place])    a. 2012 EF 30-35%; b. 04/2015 EF 40-45%; c. 01/2016 Echo: Ef 55-65%, nl AoV bioprosthesis, nl RV, nl PASP.  Marland Kitchen Depression   . Gallbladder sludge   . GERD (gastroesophageal reflux disease)   . History of Bicuspid Aortic Valve    a. 08/2010 s/p AVR @ time of Ao dissection repair; b. 01/2016 Echo: Ef 55-65%, nl AoV bioprosthesis, nl RV, nl PASP.  Marland Kitchen Hx of repair of dissecting thoracic aortic aneurysm, Stanford type A    a. 08/2010 s/p repair with AVR and VG->RCA; b. 01/2016 CTA: stable appearance of Asc Thoracic Aortic graft. Opacification of flase lumen of chronic abd Ao dissection w/ retrograde flow through lumbar arteries. No aneurysmal dil of flase lumen.  . Hyperlipidemia   . Hypertensive heart disease   .  IBS (irritable bowel syndrome)   . Internal hemorrhoids   . Marfan syndrome    pt denies  . Migraine   . Obstructive sleep apnea   . Osteoporosis   . Pneumonia   . RA (rheumatoid arthritis) ([place])    a. Followed by Dr. [name]    Past Surgical History:  Procedure Laterality Date  . ABDOMINAL AORTIC ANEURYSM REPAIR    . CARDIAC CATHETERIZATION  2001  . [place]  . CHOLECYSTECTOMY    . COLONOSCOPY    . FRACTURE SURGERY Right    shoulder replacement  . HEMORRHOID BANDING    . ORIF DISTAL RADIUS FRACTURE Left   . UPPER GASTROINTESTINAL ENDOSCOPY      There were no vitals filed for this visit.  Subjective Assessment - 05/29/18 0848    Subjective  Patient reports having a strange weekend. Has started having L jaw pain in tooth. 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     Pertinent History  Patient is a pleasant 62 year old female who presents with cervical neck pain with radiating symptoms to bilateral fingers. 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  Has high stress levels due to caregiver support.  Patient has gained 30 lb.  Has AAA and numbness began seven years ago when aneurysm occured. Sleeping with a neck pillow to relieve pain. Pain has been occurring for years.     Limitations  Sitting;Lifting;Standing;Walking;Writing;House hold activities;Other (comment);Reading    How long can you sit comfortably?  an hour     How long can you stand comfortably?  n/a    How long can you walk comfortably?  n/a    Diagnostic tests  n/a    Patient Stated Goals  decrease pain.     Currently in Pain?  Yes    Pain Score  1     Pain Location  Neck    Pain Orientation  Posterior    Pain Descriptors / Indicators  Aching    Pain Type  Chronic pain    Pain Onset  More than a month ago    Pain Frequency  Constant       Sidebending with overpressure stretch 5x  20 seconds with R more tight than L Rotation l and R with overpressure stretch 5x20 seconds with R more tight than L.  Suboccipital release 4x 20 seconds    Grade I-III CPA and UPAs c2-T1 hypomobile throughout with pain relief upon L C4 ; 4x10 seconds each level Thoracic J sweep against thoracic curvature mobilizations grade I-III 5x10 seconds      ULT median nerve glide supine 10x RUE ULT radial nerve glide supine 10x RUE       STM cervical and upper paraspinal; tighter on R cervical and upper thoracic region in seated position with head on table 7 minutes   Trigger point/soft tissue release with movement to improve soft tissue limited muscle tissue length. 5 minutes   Seated scapular retractions 20x    Pt. response to medical necessity:  Patient will continue to benefit from skilled physical therapy to reduce pain, improve cervical mobility, and allow patient better QOL                         PT Education - 05/29/18 0850    Education provided  Yes    Education Details  manual, posture,     Person(s) Educated  Patient    Methods  Explanation;Demonstration;Verbal cues    Comprehension  Verbalized understanding;Returned demonstration       PT Short Term Goals - 05/18/18 0824      PT SHORT TERM GOAL #1   Title  Patient will be independent in home exercise program to improve strength/mobility for better functional independence with ADLs.    Baseline  HEP compliance    Time  2    Period  Weeks    Status  Achieved      PT SHORT TERM GOAL #2   Title  Patient will report a worst pain of 5/10 on VAS in neck to improve tolerance with ADLs and reduced symptoms with activities.     Baseline  3/4: worst pain 8/10; 4/3: 6/10 4/30: 7/10 tension headache 5/30 7/10 when lifting husband from floor    Time  2    Period  Weeks    Status  Partially Met    Target Date  06/01/18        PT Long Term Goals - 05/18/18 0825      PT LONG TERM GOAL #1   Title  Patient  will report a worst pain of 3/10 on VAS in cervical spine to improve tolerance with ADLs  and reduced symptoms with activities    Baseline  worst pain 8/10; 4/3: 6/10 4/30: 7/10 tension headache from caregiver. 5/30: 7/10 from lifting husband    Time  4    Period  Weeks    Status  Partially Met      PT LONG TERM GOAL #2   Title  Patient will have no tingling in bilateral hands for two consecutive weeks to improve quality of life.     Baseline   4/3: had no tingling for about a week, came back this week 3x due to husband in the hospital. 4/30: 2x a week. 5/30: no tingling.     Time  4    Period  Weeks    Status  Achieved      PT LONG TERM GOAL #3   Title  Patient will decrease NDI score to 28% to decrease level of perceived disability to minimal.     Baseline  3/4: 46%; 4/3: 44%: 4/30: 54% 5/30: 40%     Time  4    Period  Weeks    Status  Partially Met    Target Date  06/15/18      PT LONG TERM GOAL #4   Title  Patient will improve cervical rotation ROM to 60 degrees bilaterally to improve functional mobility.     Baseline  3/4: R 39 L 41; 4/3: R 42 , L 46 4/30: supine L55 R 54 ; 5/30: seated L 55 R 56    Time  4    Period  Weeks    Status  Partially Met    Target Date  06/15/18      PT LONG TERM GOAL #5   Title  Patient will have reduced occurances of high levels of pain to improve quality of life    Baseline  initially was everyday now is 2x/week    Time  4    Period  Weeks    Status  New            Plan - 05/29/18 1127    Clinical Impression Statement  Patient's muscle tension of super medial scapular border continues to be limited demonstrating levator and upper trap involvement. Manual released majority of tension resulting in improved quality of movement.  Patient will continue to benefit from skilled physical therapy to reduce pain, improve cervical mobility, and allow patient better QOL    Rehab Potential  Fair    Clinical Impairments Affecting Rehab Potential  +  CAD testing, history of AAA, anemia, anxiety, asthma, CAD, chronic combined ystolic heart failure, GERD, Marfan syndrome, OA, RA (+) good work Psychologist, forensic, motivated, age     PT Frequency  2x / week    PT Duration  4 weeks    PT Treatment/Interventions  ADLs/Self Care Home Management;Cryotherapy;Traction;Ultrasound;Therapeutic exercise;Therapeutic activities;Neuromuscular re-education;Patient/family education;Manual techniques;Passive range of motion;Dry needling;Energy conservation;Visual/perceptual remediation/compensation    PT Next Visit Plan  nerve glides, muscle tissue length, posture    PT Home Exercise Plan  give next session.    Consulted and Agree with Plan of Care  Patient       Patient will benefit from skilled therapeutic intervention in order to improve the following deficits and impairments:  Decreased activity tolerance, Decreased safety awareness, Decreased range of motion, Decreased mobility, Decreased strength, Impaired flexibility, Postural dysfunction, Improper body mechanics, Pain  Visit Diagnosis: Radiculopathy, cervical region  Abnormal posture  Cervicalgia     Problem List Patient Active Problem List   Diagnosis Date  Noted  . Cough 09/08/2017  . Type 1 dissection of ascending aorta ([place]) 04/23/2017  . HPV (human papilloma virus) infection 12/01/2016  . Screening mammogram, encounter for 11/29/2016  . Estrogen deficiency 11/29/2016  . Encounter for routine gynecological examination 11/29/2016  . Grade III hemorrhoids 10/06/2016  . Tachycardia 08/14/2016  . Hemorrhoids 08/13/2016  . Atypical migraine 08/03/2016  . CAD (coronary artery disease)   . AAA (abdominal aortic aneurysm) ([place])   . Chronic combined systolic (congestive) and diastolic (congestive) heart failure ([place])   . Hyperglycemia 02/11/2016  . Generalized anxiety disorder 12/08/2015  . Panic attacks 07/28/2015  . Sensorineural hearing loss 03/27/2013  . Vitamin D deficiency 07/03/2012  . H/O  aortic dissection 05/12/2012  . Obesity (BMI 30.0-34.9) 11/21/2011  . Routine general medical examination at a health care facility 09/20/2011  . S/P AVR (aortic valve replacement) 01/07/2011  . CORONARY ARTERY BYPASS GRAFT, HX OF 01/07/2011  . Arthritis 12/20/2010  . H/O dissecting abdominal aortic aneurysm repair 08/24/2010  . IRRITABLE BOWEL SYNDROME 09/09/2009  . Obstructive sleep apnea 08/04/2009  . External hemorrhoid 06/26/2009  . Osteoporosis 01/09/2009  . Hyperlipidemia 07/26/2007  . Depression with anxiety 07/26/2007  . Essential hypertension 07/26/2007  . Allergic rhinitis 07/26/2007  . Asthma 07/26/2007  . GERD 07/26/2007  . Osteoarthritis 07/26/2007  . Ocular migraine 07/26/2007  . Mild intermittent asthma without complication 47/58/3074   Janna Arch, PT, DPT   05/29/2018, 11:32 AM  [place] MAIN [facility] SERVICES [address] [place], [place], [address] Phone: [phone]   Fax:  [phone]  Name: Rachel Torres MRN: [id number] Date of Birth: 02-10-1956

## 2018-05-31 ENCOUNTER — Ambulatory Visit: Payer: Medicare HMO

## 2018-05-31 DIAGNOSIS — M5412 Radiculopathy, cervical region: Secondary | ICD-10-CM | POA: Diagnosis not present

## 2018-05-31 DIAGNOSIS — M542 Cervicalgia: Secondary | ICD-10-CM

## 2018-05-31 DIAGNOSIS — R293 Abnormal posture: Secondary | ICD-10-CM | POA: Diagnosis not present

## 2018-05-31 NOTE — Therapy (Signed)
[place] MAIN [facility] SERVICES [address] [place], [place], [address] Phone: [phone]   Fax:  [phone]  Physical Therapy Treatment  Patient Details  Name: Rachel Torres MRN: [id number] Date of Birth: 1956/03/18 Referring Provider: Mallie Mussel pool   Encounter Date: 05/31/2018  PT End of Session - 05/31/18 0803    Visit Number  21    Number of Visits  26    Date for PT Re-Evaluation  06/15/18    PT Start Time  0800    PT Stop Time  0844    PT Time Calculation (min)  44 min    Activity Tolerance  Treatment limited secondary to medical complications (Comment);Patient tolerated treatment well    Behavior During Therapy  [facility] for tasks assessed/performed       Past Medical History:  Diagnosis Date  . AAA (abdominal aortic aneurysm) ([place])    a. Chronic w/o evidence of aneurysmal dil on CTA 01/2016.  Marland Kitchen Allergy   . Anemia   . Anxiety   . Asthma   . CAD (coronary artery disease)    a. 08/2010 s/p CABG x 1 (VG->RCA) @ time of Ao dissection repair; b. 01/2016 Lexiscan MV: mid antsept/apical defect w/ ? peri-infarct ischemia-->likely attenuation-->Med Rx.  . Chronic combined systolic (congestive) and diastolic (congestive) heart failure ([place])    a. 2012 EF 30-35%; b. 04/2015 EF 40-45%; c. 01/2016 Echo: Ef 55-65%, nl AoV bioprosthesis, nl RV, nl PASP.  Marland Kitchen Depression   . Gallbladder sludge   . GERD (gastroesophageal reflux disease)   . History of Bicuspid Aortic Valve    a. 08/2010 s/p AVR @ time of Ao dissection repair; b. 01/2016 Echo: Ef 55-65%, nl AoV bioprosthesis, nl RV, nl PASP.  Marland Kitchen Hx of repair of dissecting thoracic aortic aneurysm, Stanford type A    a. 08/2010 s/p repair with AVR and VG->RCA; b. 01/2016 CTA: stable appearance of Asc Thoracic Aortic graft. Opacification of flase lumen of chronic abd Ao dissection w/ retrograde flow through lumbar arteries. No aneurysmal dil of flase lumen.  . Hyperlipidemia   . Hypertensive heart disease   .  IBS (irritable bowel syndrome)   . Internal hemorrhoids   . Marfan syndrome    pt denies  . Migraine   . Obstructive sleep apnea   . Osteoporosis   . Pneumonia   . RA (rheumatoid arthritis) ([place])    a. Followed by Dr. [name]    Past Surgical History:  Procedure Laterality Date  . ABDOMINAL AORTIC ANEURYSM REPAIR    . CARDIAC CATHETERIZATION  2001  . [place]  . CHOLECYSTECTOMY    . COLONOSCOPY    . FRACTURE SURGERY Right    shoulder replacement  . HEMORRHOID BANDING    . ORIF DISTAL RADIUS FRACTURE Left   . UPPER GASTROINTESTINAL ENDOSCOPY      There were no vitals filed for this visit.  Subjective Assessment - 05/31/18 0802    Subjective  Patient reports straining excessively to pick up husband out of bush when he fell. Had to ice and muscle relaxer for relief afterwards.     Pertinent History  Patient is a pleasant 62 year old female who presents with cervical neck pain with radiating symptoms to bilateral fingers. 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  Has high stress levels due to caregiver support. Patient has gained 30 lb.  Has AAA and numbness began seven years  ago when aneurysm occured. Sleeping with a neck pillow to relieve pain. Pain has been occurring for years.     Limitations  Sitting;Lifting;Standing;Walking;Writing;House hold activities;Other (comment);Reading    How long can you sit comfortably?  an hour     How long can you stand comfortably?  n/a    How long can you walk comfortably?  n/a    Diagnostic tests  n/a    Patient Stated Goals  decrease pain.     Currently in Pain?  Yes    Pain Score  3     Pain Location  Neck    Pain Orientation  Posterior    Pain Descriptors / Indicators  Aching    Pain Type  Chronic pain    Pain Onset  More than a month ago       Sidebending with overpressure stretch 5x 20 seconds with R more tight than L Rotation l and R with overpressure stretch 5x20  seconds with R more tight than L.  Suboccipital release 4x 20 seconds    Grade I-III CPA and UPAs c2-T1 hypomobile throughout with pain relief upon L C4 ; 4x10 seconds each level Thoracic J sweep against thoracic curvature mobilizations grade I-III 5x10 seconds    Lay on half foam roller in supine robber stretch 3x30 seconds  Supine on half foam roller abduction flys with RTB 10x    ULT median nerve glide supine 10x RUE ULT radial nerve glide supine 10x RUE       STM cervical and upper paraspinal; tighter on R cervical and upper thoracic region in seated position with head on table 7 minutes   Trigger point/soft tissue release with movement to improve soft tissue limited muscle tissue length. 5 minutes   Seated scapular retractions 20x    Pt. response to medical necessity:  Patient will continue to benefit from skilled physical therapy to reduce pain, improve cervical mobility, and allow patient better QOL                         PT Education - 05/31/18 0803    Education provided  Yes    Education Details  manual, posture, exercise technique     Person(s) Educated  Patient    Methods  Explanation;Demonstration;Verbal cues    Comprehension  Verbalized understanding;Returned demonstration       PT Short Term Goals - 05/18/18 0824      PT SHORT TERM GOAL #1   Title  Patient will be independent in home exercise program to improve strength/mobility for better functional independence with ADLs.    Baseline  HEP compliance    Time  2    Period  Weeks    Status  Achieved      PT SHORT TERM GOAL #2   Title  Patient will report a worst pain of 5/10 on VAS in neck to improve tolerance with ADLs and reduced symptoms with activities.     Baseline  3/4: worst pain 8/10; 4/3: 6/10 4/30: 7/10 tension headache 5/30 7/10 when lifting husband from floor    Time  2    Period  Weeks    Status  Partially Met    Target Date  06/01/18        PT Long Term Goals -  05/18/18 0825      PT LONG TERM GOAL #1   Title  Patient will report a worst pain of 3/10 on VAS in cervical spine  to improve tolerance with ADLs and reduced symptoms with activities    Baseline  worst pain 8/10; 4/3: 6/10 4/30: 7/10 tension headache from caregiver. 5/30: 7/10 from lifting husband    Time  4    Period  Weeks    Status  Partially Met      PT LONG TERM GOAL #2   Title  Patient will have no tingling in bilateral hands for two consecutive weeks to improve quality of life.     Baseline   4/3: had no tingling for about a week, came back this week 3x due to husband in the hospital. 4/30: 2x a week. 5/30: no tingling.     Time  4    Period  Weeks    Status  Achieved      PT LONG TERM GOAL #3   Title  Patient will decrease NDI score to 28% to decrease level of perceived disability to minimal.     Baseline  3/4: 46%; 4/3: 44%: 4/30: 54% 5/30: 40%     Time  4    Period  Weeks    Status  Partially Met    Target Date  06/15/18      PT LONG TERM GOAL #4   Title  Patient will improve cervical rotation ROM to 60 degrees bilaterally to improve functional mobility.     Baseline  3/4: R 39 L 41; 4/3: R 42 , L 46 4/30: supine L55 R 54 ; 5/30: seated L 55 R 56    Time  4    Period  Weeks    Status  Partially Met    Target Date  06/15/18      PT LONG TERM GOAL #5   Title  Patient will have reduced occurances of high levels of pain to improve quality of life    Baseline  initially was everyday now is 2x/week    Time  4    Period  Weeks    Status  New            Plan - 05/31/18 0817    Clinical Impression Statement  Patient introduced to robber stretch on half foam roller to improve muscle tissue length of pectoral muscles for postural correction. Patient continues to regress between sessions due to lifting husband from his repeated falls. Patient will continue to benefit from skilled physical therapy to reduce pain, improve cervical mobility, and allow patient better QOL     Rehab Potential  Fair    Clinical Impairments Affecting Rehab Potential  + CAD testing, history of AAA, anemia, anxiety, asthma, CAD, chronic combined ystolic heart failure, GERD, Marfan syndrome, OA, RA (+) good work Psychologist, forensic, motivated, age     PT Frequency  2x / week    PT Duration  4 weeks    PT Treatment/Interventions  ADLs/Self Care Home Management;Cryotherapy;Traction;Ultrasound;Therapeutic exercise;Therapeutic activities;Neuromuscular re-education;Patient/family education;Manual techniques;Passive range of motion;Dry needling;Energy conservation;Visual/perceptual remediation/compensation    PT Next Visit Plan  nerve glides, muscle tissue length, posture    PT Home Exercise Plan  give next session.    Consulted and Agree with Plan of Care  Patient       Patient will benefit from skilled therapeutic intervention in order to improve the following deficits and impairments:  Decreased activity tolerance, Decreased safety awareness, Decreased range of motion, Decreased mobility, Decreased strength, Impaired flexibility, Postural dysfunction, Improper body mechanics, Pain  Visit Diagnosis: Radiculopathy, cervical region  Abnormal posture  Cervicalgia     Problem  List Patient Active Problem List   Diagnosis Date Noted  . Cough 09/08/2017  . Type 1 dissection of ascending aorta ([place]) 04/23/2017  . HPV (human papilloma virus) infection 12/01/2016  . Screening mammogram, encounter for 11/29/2016  . Estrogen deficiency 11/29/2016  . Encounter for routine gynecological examination 11/29/2016  . Grade III hemorrhoids 10/06/2016  . Tachycardia 08/14/2016  . Hemorrhoids 08/13/2016  . Atypical migraine 08/03/2016  . CAD (coronary artery disease)   . AAA (abdominal aortic aneurysm) ([place])   . Chronic combined systolic (congestive) and diastolic (congestive) heart failure ([place])   . Hyperglycemia 02/11/2016  . Generalized anxiety disorder 12/08/2015  . Panic attacks 07/28/2015  .  Sensorineural hearing loss 03/27/2013  . Vitamin D deficiency 07/03/2012  . H/O aortic dissection 05/12/2012  . Obesity (BMI 30.0-34.9) 11/21/2011  . Routine general medical examination at a health care facility 09/20/2011  . S/P AVR (aortic valve replacement) 01/07/2011  . CORONARY ARTERY BYPASS GRAFT, HX OF 01/07/2011  . Arthritis 12/20/2010  . H/O dissecting abdominal aortic aneurysm repair 08/24/2010  . IRRITABLE BOWEL SYNDROME 09/09/2009  . Obstructive sleep apnea 08/04/2009  . External hemorrhoid 06/26/2009  . Osteoporosis 01/09/2009  . Hyperlipidemia 07/26/2007  . Depression with anxiety 07/26/2007  . Essential hypertension 07/26/2007  . Allergic rhinitis 07/26/2007  . Asthma 07/26/2007  . GERD 07/26/2007  . Osteoarthritis 07/26/2007  . Ocular migraine 07/26/2007  . Mild intermittent asthma without complication 05/69/7948   Janna Arch, PT, DPT   05/31/2018, 8:49 AM  [place] MAIN [facility] SERVICES [address] [place], [place], [address] Phone: [phone]   Fax:  [phone]  Name: Rachel Torres MRN: [id number] Date of Birth: [birth year]

## 2018-06-05 ENCOUNTER — Ambulatory Visit: Payer: Medicare HMO | Admitting: Psychology

## 2018-06-05 ENCOUNTER — Ambulatory Visit: Payer: Medicare HMO

## 2018-06-05 DIAGNOSIS — M5412 Radiculopathy, cervical region: Secondary | ICD-10-CM

## 2018-06-05 DIAGNOSIS — M542 Cervicalgia: Secondary | ICD-10-CM

## 2018-06-05 DIAGNOSIS — R293 Abnormal posture: Secondary | ICD-10-CM

## 2018-06-05 NOTE — Therapy (Signed)
[place] MAIN [facility] SERVICES [address] [place], [place], [address] Phone: [phone]   Fax:  [phone]  Physical Therapy Treatment  Patient Details  Name: Rachel Torres MRN: [id number] Date of Birth: 10/01/1956 Referring Provider: Mallie Mussel pool   Encounter Date: 06/05/2018  PT End of Session - 06/05/18 1052    Visit Number  22    Number of Visits  26    Date for PT Re-Evaluation  06/15/18    PT Start Time  0845    PT Stop Time  0929    PT Time Calculation (min)  44 min    Activity Tolerance  Treatment limited secondary to medical complications (Comment);Patient tolerated treatment well    Behavior During Therapy  [facility] for tasks assessed/performed       Past Medical History:  Diagnosis Date  . AAA (abdominal aortic aneurysm) ([place])    a. Chronic w/o evidence of aneurysmal dil on CTA 01/2016.  Marland Kitchen Allergy   . Anemia   . Anxiety   . Asthma   . CAD (coronary artery disease)    a. 08/2010 s/p CABG x 1 (VG->RCA) @ time of Ao dissection repair; b. 01/2016 Lexiscan MV: mid antsept/apical defect w/ ? peri-infarct ischemia-->likely attenuation-->Med Rx.  . Chronic combined systolic (congestive) and diastolic (congestive) heart failure ([place])    a. 2012 EF 30-35%; b. 04/2015 EF 40-45%; c. 01/2016 Echo: Ef 55-65%, nl AoV bioprosthesis, nl RV, nl PASP.  Marland Kitchen Depression   . Gallbladder sludge   . GERD (gastroesophageal reflux disease)   . History of Bicuspid Aortic Valve    a. 08/2010 s/p AVR @ time of Ao dissection repair; b. 01/2016 Echo: Ef 55-65%, nl AoV bioprosthesis, nl RV, nl PASP.  Marland Kitchen Hx of repair of dissecting thoracic aortic aneurysm, Stanford type A    a. 08/2010 s/p repair with AVR and VG->RCA; b. 01/2016 CTA: stable appearance of Asc Thoracic Aortic graft. Opacification of flase lumen of chronic abd Ao dissection w/ retrograde flow through lumbar arteries. No aneurysmal dil of flase lumen.  . Hyperlipidemia   . Hypertensive heart disease   .  IBS (irritable bowel syndrome)   . Internal hemorrhoids   . Marfan syndrome    pt denies  . Migraine   . Obstructive sleep apnea   . Osteoporosis   . Pneumonia   . RA (rheumatoid arthritis) ([place])    a. Followed by Dr. [name]    Past Surgical History:  Procedure Laterality Date  . ABDOMINAL AORTIC ANEURYSM REPAIR    . CARDIAC CATHETERIZATION  2001  . [place]  . CHOLECYSTECTOMY    . COLONOSCOPY    . FRACTURE SURGERY Right    shoulder replacement  . HEMORRHOID BANDING    . ORIF DISTAL RADIUS FRACTURE Left   . UPPER GASTROINTESTINAL ENDOSCOPY      There were no vitals filed for this visit.  Subjective Assessment - 06/05/18 0848    Subjective  Patient reports compliance with HEP. Reports tingling in hands has gone away. Reports having to lift husband over the weekend.     Pertinent History  Patient is a pleasant 62 year old female who presents with cervical neck pain with radiating symptoms to bilateral fingers. 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  Has high stress levels due to caregiver support. Patient has gained 30 lb.  Has AAA and numbness began seven years ago when aneurysm  occured. Sleeping with a neck pillow to relieve pain. Pain has been occurring for years.     Limitations  Sitting;Lifting;Standing;Walking;Writing;House hold activities;Other (comment);Reading    How long can you sit comfortably?  an hour     How long can you stand comfortably?  n/a    How long can you walk comfortably?  n/a    Diagnostic tests  n/a    Patient Stated Goals  decrease pain.     Currently in Pain?  Yes    Pain Score  2     Pain Location  Neck    Pain Orientation  Posterior    Pain Descriptors / Indicators  Aching    Pain Type  Chronic pain    Pain Onset  More than a month ago    Pain Frequency  Constant       Sidebending with overpressure stretch 5x 20 seconds with R more tight than L Rotation l and R with  overpressure stretch 5x20 seconds with R more tight than L.  Suboccipital release 4x 20 seconds    Grade I-III CPA and UPAs c2-T1 hypomobile throughout with pain relief upon L C4 ; 4x10 seconds each level Thoracic J sweep against thoracic curvature mobilizations grade I-III 5x10 seconds    Lay on half foam roller in supine robber stretch 3x30 seconds   Supine on half foam roller abduction flys with RTB 10x     ULT median nerve glide supine 10x RUE ULT radial nerve glide supine 10x RUE       STM cervical and upper paraspinal; tighter on R cervical and upper thoracic region in seated position with head on table 7 minutes   Trigger point/soft tissue release with movement to improve soft tissue limited muscle tissue length. 5 minutes   Seated scapular retractions 20x    Pt. response to medical necessity:  Patient will continue to benefit from skilled physical therapy to reduce pain, improve cervical mobility, and allow patient better QOL                         PT Education - 06/05/18 1052    Education provided  Yes    Education Details  posture, manual    Person(s) Educated  Patient    Methods  Explanation;Demonstration;Verbal cues    Comprehension  Verbalized understanding;Returned demonstration       PT Short Term Goals - 05/18/18 0824      PT SHORT TERM GOAL #1   Title  Patient will be independent in home exercise program to improve strength/mobility for better functional independence with ADLs.    Baseline  HEP compliance    Time  2    Period  Weeks    Status  Achieved      PT SHORT TERM GOAL #2   Title  Patient will report a worst pain of 5/10 on VAS in neck to improve tolerance with ADLs and reduced symptoms with activities.     Baseline  3/4: worst pain 8/10; 4/3: 6/10 4/30: 7/10 tension headache 5/30 7/10 when lifting husband from floor    Time  2    Period  Weeks    Status  Partially Met    Target Date  06/01/18        PT Long Term  Goals - 05/18/18 0825      PT LONG TERM GOAL #1   Title  Patient will report a worst pain of 3/10 on VAS  in cervical spine to improve tolerance with ADLs and reduced symptoms with activities    Baseline  worst pain 8/10; 4/3: 6/10 4/30: 7/10 tension headache from caregiver. 5/30: 7/10 from lifting husband    Time  4    Period  Weeks    Status  Partially Met      PT LONG TERM GOAL #2   Title  Patient will have no tingling in bilateral hands for two consecutive weeks to improve quality of life.     Baseline   4/3: had no tingling for about a week, came back this week 3x due to husband in the hospital. 4/30: 2x a week. 5/30: no tingling.     Time  4    Period  Weeks    Status  Achieved      PT LONG TERM GOAL #3   Title  Patient will decrease NDI score to 28% to decrease level of perceived disability to minimal.     Baseline  3/4: 46%; 4/3: 44%: 4/30: 54% 5/30: 40%     Time  4    Period  Weeks    Status  Partially Met    Target Date  06/15/18      PT LONG TERM GOAL #4   Title  Patient will improve cervical rotation ROM to 60 degrees bilaterally to improve functional mobility.     Baseline  3/4: R 39 L 41; 4/3: R 42 , L 46 4/30: supine L55 R 54 ; 5/30: seated L 55 R 56    Time  4    Period  Weeks    Status  Partially Met    Target Date  06/15/18      PT LONG TERM GOAL #5   Title  Patient will have reduced occurances of high levels of pain to improve quality of life    Baseline  initially was everyday now is 2x/week    Time  4    Period  Weeks    Status  New            Plan - 06/05/18 1242    Clinical Impression Statement  Patient demonstrates improved functional ROM after manual due to improved muscle tissue lengfth. Patient has noted bilateral superomedial scapular tightness.Patient will continue to benefit from skilled physical therapy to reduce pain, improve cervical mobility, and allow patient better QOL     Rehab Potential  Fair    Clinical Impairments Affecting Rehab  Potential  + CAD testing, history of AAA, anemia, anxiety, asthma, CAD, chronic combined ystolic heart failure, GERD, Marfan syndrome, OA, RA (+) good work Psychologist, forensic, motivated, age     PT Frequency  2x / week    PT Duration  4 weeks    PT Treatment/Interventions  ADLs/Self Care Home Management;Cryotherapy;Traction;Ultrasound;Therapeutic exercise;Therapeutic activities;Neuromuscular re-education;Patient/family education;Manual techniques;Passive range of motion;Dry needling;Energy conservation;Visual/perceptual remediation/compensation    PT Next Visit Plan  nerve glides, muscle tissue length, posture    PT Home Exercise Plan  give next session.    Consulted and Agree with Plan of Care  Patient       Patient will benefit from skilled therapeutic intervention in order to improve the following deficits and impairments:  Decreased activity tolerance, Decreased safety awareness, Decreased range of motion, Decreased mobility, Decreased strength, Impaired flexibility, Postural dysfunction, Improper body mechanics, Pain  Visit Diagnosis: Radiculopathy, cervical region  Abnormal posture  Cervicalgia     Problem List Patient Active Problem List   Diagnosis Date Noted  .  Cough 09/08/2017  . Type 1 dissection of ascending aorta ([place]) 04/23/2017  . HPV (human papilloma virus) infection 12/01/2016  . Screening mammogram, encounter for 11/29/2016  . Estrogen deficiency 11/29/2016  . Encounter for routine gynecological examination 11/29/2016  . Grade III hemorrhoids 10/06/2016  . Tachycardia 08/14/2016  . Hemorrhoids 08/13/2016  . Atypical migraine 08/03/2016  . CAD (coronary artery disease)   . AAA (abdominal aortic aneurysm) ([place])   . Chronic combined systolic (congestive) and diastolic (congestive) heart failure ([place])   . Hyperglycemia 02/11/2016  . Generalized anxiety disorder 12/08/2015  . Panic attacks 07/28/2015  . Sensorineural hearing loss 03/27/2013  . Vitamin D deficiency 07/03/2012   . H/O aortic dissection 05/12/2012  . Obesity (BMI 30.0-34.9) 11/21/2011  . Routine general medical examination at a health care facility 09/20/2011  . S/P AVR (aortic valve replacement) 01/07/2011  . CORONARY ARTERY BYPASS GRAFT, HX OF 01/07/2011  . Arthritis 12/20/2010  . H/O dissecting abdominal aortic aneurysm repair 08/24/2010  . IRRITABLE BOWEL SYNDROME 09/09/2009  . Obstructive sleep apnea 08/04/2009  . External hemorrhoid 06/26/2009  . Osteoporosis 01/09/2009  . Hyperlipidemia 07/26/2007  . Depression with anxiety 07/26/2007  . Essential hypertension 07/26/2007  . Allergic rhinitis 07/26/2007  . Asthma 07/26/2007  . GERD 07/26/2007  . Osteoarthritis 07/26/2007  . Ocular migraine 07/26/2007  . Mild intermittent asthma without complication 32/11/2481   Janna Arch, PT, DPT   06/05/2018, 12:43 PM  [place] MAIN [facility] SERVICES [address] [place], [place], [address] Phone: [phone]   Fax:  [phone]  Name: Rachel Torres MRN: [id number] Date of Birth: Jul 31, 1956

## 2018-06-08 ENCOUNTER — Ambulatory Visit: Payer: Medicare HMO

## 2018-06-08 DIAGNOSIS — M542 Cervicalgia: Secondary | ICD-10-CM

## 2018-06-08 DIAGNOSIS — R293 Abnormal posture: Secondary | ICD-10-CM | POA: Diagnosis not present

## 2018-06-08 DIAGNOSIS — M5412 Radiculopathy, cervical region: Secondary | ICD-10-CM | POA: Diagnosis not present

## 2018-06-08 NOTE — Therapy (Signed)
[place] MAIN [facility] SERVICES [address] [place], [place], [address] Phone: [phone]   Fax:  [phone]  Physical Therapy Treatment  Patient Details  Name: Rachel Torres MRN: [id number] Date of Birth: Nov 06, 1956 Referring Provider: Mallie Mussel pool   Encounter Date: 06/08/2018  PT End of Session - 06/08/18 1123    Visit Number  23    Number of Visits  26    Date for PT Re-Evaluation  06/15/18    PT Start Time  0846    PT Stop Time  0930    PT Time Calculation (min)  44 min    Activity Tolerance  Treatment limited secondary to medical complications (Comment);Patient tolerated treatment well    Behavior During Therapy  [place] for tasks assessed/performed       Past Medical History:  Diagnosis Date  . AAA (abdominal aortic aneurysm) ([place])    a. Chronic w/o evidence of aneurysmal dil on CTA 01/2016.  Marland Kitchen Allergy   . Anemia   . Anxiety   . Asthma   . CAD (coronary artery disease)    a. 08/2010 s/p CABG x 1 (VG->RCA) @ time of Ao dissection repair; b. 01/2016 Lexiscan MV: mid antsept/apical defect w/ ? peri-infarct ischemia-->likely attenuation-->Med Rx.  . Chronic combined systolic (congestive) and diastolic (congestive) heart failure ([place])    a. 2012 EF 30-35%; b. 04/2015 EF 40-45%; c. 01/2016 Echo: Ef 55-65%, nl AoV bioprosthesis, nl RV, nl PASP.  Marland Kitchen Depression   . Gallbladder sludge   . GERD (gastroesophageal reflux disease)   . History of Bicuspid Aortic Valve    a. 08/2010 s/p AVR @ time of Ao dissection repair; b. 01/2016 Echo: Ef 55-65%, nl AoV bioprosthesis, nl RV, nl PASP.  Marland Kitchen Hx of repair of dissecting thoracic aortic aneurysm, Stanford type A    a. 08/2010 s/p repair with AVR and VG->RCA; b. 01/2016 CTA: stable appearance of Asc Thoracic Aortic graft. Opacification of flase lumen of chronic abd Ao dissection w/ retrograde flow through lumbar arteries. No aneurysmal dil of flase lumen.  . Hyperlipidemia   . Hypertensive heart disease   .  IBS (irritable bowel syndrome)   . Internal hemorrhoids   . Marfan syndrome    pt denies  . Migraine   . Obstructive sleep apnea   . Osteoporosis   . Pneumonia   . RA (rheumatoid arthritis) ([place])    a. Followed by Dr. [name]    Past Surgical History:  Procedure Laterality Date  . ABDOMINAL AORTIC ANEURYSM REPAIR    . CARDIAC CATHETERIZATION  2001  . [place]  . CHOLECYSTECTOMY    . COLONOSCOPY    . FRACTURE SURGERY Right    shoulder replacement  . HEMORRHOID BANDING    . ORIF DISTAL RADIUS FRACTURE Left   . UPPER GASTROINTESTINAL ENDOSCOPY      There were no vitals filed for this visit.  Subjective Assessment - 06/08/18 0848    Subjective  Patient c/o of pain in R shoulder/neck from picking up husband. Woke up with it this morning. Reports compliance with HEP except for yesterday.     Pertinent History  Patient is a pleasant 62 year old female who presents with cervical neck pain with radiating symptoms to bilateral fingers. 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  Has high stress levels due to caregiver support. Patient has gained 30 lb.  Has AAA and numbness began seven  years ago when aneurysm occured. Sleeping with a neck pillow to relieve pain. Pain has been occurring for years.     Limitations  Sitting;Lifting;Standing;Walking;Writing;House hold activities;Other (comment);Reading    How long can you sit comfortably?  an hour     How long can you stand comfortably?  n/a    How long can you walk comfortably?  n/a    Diagnostic tests  n/a    Patient Stated Goals  decrease pain.     Currently in Pain?  Yes    Pain Score  3     Pain Location  Shoulder    Pain Orientation  Right    Pain Descriptors / Indicators  Aching    Pain Type  Chronic pain    Pain Radiating Towards  radiating to right    Pain Onset  More than a month ago    Pain Frequency  Constant         Sidebending with overpressure stretch 5x  20 seconds with R more tight than L Rotation l and R with overpressure stretch 5x20 seconds with R more tight than L.  Suboccipital release 4x 20 seconds    Grade I-III CPA and UPAs c2-T1 hypomobile throughout with pain relief upon L C4 ; 4x10 seconds each level Thoracic J sweep against thoracic curvature mobilizations grade I-III 5x10 seconds    Lay on half foam roller in supine robber stretch 3x30 seconds   Supine on half foam roller abduction flys with RTB 10x     ULT median nerve glide supine 10x RUE ULT radial nerve glide supine 10x RUE       STM cervical and upper paraspinal; tighter on R cervical and upper thoracic region in seated position with head on table 7 minutes   Trigger point/soft tissue release with movement to improve soft tissue limited muscle tissue length. 5 minutes   Seated scapular retractions 20x    Pt. response to medical necessity:  Patient will continue to benefit from skilled physical therapy to reduce pain, improve cervical mobility, and allow patient better QOL                         PT Education - 06/08/18 1123    Education provided  Yes    Education Details  manual, posture    Person(s) Educated  Patient    Methods  Explanation;Demonstration    Comprehension  Verbalized understanding;Returned demonstration       PT Short Term Goals - 05/18/18 0824      PT SHORT TERM GOAL #1   Title  Patient will be independent in home exercise program to improve strength/mobility for better functional independence with ADLs.    Baseline  HEP compliance    Time  2    Period  Weeks    Status  Achieved      PT SHORT TERM GOAL #2   Title  Patient will report a worst pain of 5/10 on VAS in neck to improve tolerance with ADLs and reduced symptoms with activities.     Baseline  3/4: worst pain 8/10; 4/3: 6/10 4/30: 7/10 tension headache 5/30 7/10 when lifting husband from floor    Time  2    Period  Weeks    Status  Partially Met     Target Date  06/01/18        PT Long Term Goals - 05/18/18 0825      PT LONG TERM GOAL #  1   Title  Patient will report a worst pain of 3/10 on VAS in cervical spine to improve tolerance with ADLs and reduced symptoms with activities    Baseline  worst pain 8/10; 4/3: 6/10 4/30: 7/10 tension headache from caregiver. 5/30: 7/10 from lifting husband    Time  4    Period  Weeks    Status  Partially Met      PT LONG TERM GOAL #2   Title  Patient will have no tingling in bilateral hands for two consecutive weeks to improve quality of life.     Baseline   4/3: had no tingling for about a week, came back this week 3x due to husband in the hospital. 4/30: 2x a week. 5/30: no tingling.     Time  4    Period  Weeks    Status  Achieved      PT LONG TERM GOAL #3   Title  Patient will decrease NDI score to 28% to decrease level of perceived disability to minimal.     Baseline  3/4: 46%; 4/3: 44%: 4/30: 54% 5/30: 40%     Time  4    Period  Weeks    Status  Partially Met    Target Date  06/15/18      PT LONG TERM GOAL #4   Title  Patient will improve cervical rotation ROM to 60 degrees bilaterally to improve functional mobility.     Baseline  3/4: R 39 L 41; 4/3: R 42 , L 46 4/30: supine L55 R 54 ; 5/30: seated L 55 R 56    Time  4    Period  Weeks    Status  Partially Met    Target Date  06/15/18      PT LONG TERM GOAL #5   Title  Patient will have reduced occurances of high levels of pain to improve quality of life    Baseline  initially was everyday now is 2x/week    Time  4    Period  Weeks    Status  New            Plan - 06/08/18 1125    Clinical Impression Statement   Patient presents with hypomobile cervical and upper thoracic vertebrae on right side with decreased muscle tissue length. Patient continues to progress with manual and STM with movement for soft tissue release. Patient will continue to benefit from skilled physical therapy to reduce pain, improve cervical  mobility, and allow patient better QOL    Rehab Potential  Fair    Clinical Impairments Affecting Rehab Potential  + CAD testing, history of AAA, anemia, anxiety, asthma, CAD, chronic combined ystolic heart failure, GERD, Marfan syndrome, OA, RA (+) good work Psychologist, forensic, motivated, age     PT Frequency  2x / week    PT Duration  4 weeks    PT Treatment/Interventions  ADLs/Self Care Home Management;Cryotherapy;Traction;Ultrasound;Therapeutic exercise;Therapeutic activities;Neuromuscular re-education;Patient/family education;Manual techniques;Passive range of motion;Dry needling;Energy conservation;Visual/perceptual remediation/compensation    PT Next Visit Plan  nerve glides, muscle tissue length, posture    PT Home Exercise Plan  give next session.    Consulted and Agree with Plan of Care  Patient       Patient will benefit from skilled therapeutic intervention in order to improve the following deficits and impairments:  Decreased activity tolerance, Decreased safety awareness, Decreased range of motion, Decreased mobility, Decreased strength, Impaired flexibility, Postural dysfunction, Improper body mechanics, Pain  Visit Diagnosis: Radiculopathy, cervical region  Abnormal posture  Cervicalgia     Problem List Patient Active Problem List   Diagnosis Date Noted  . Cough 09/08/2017  . Type 1 dissection of ascending aorta ([place]) 04/23/2017  . HPV (human papilloma virus) infection 12/01/2016  . Screening mammogram, encounter for 11/29/2016  . Estrogen deficiency 11/29/2016  . Encounter for routine gynecological examination 11/29/2016  . Grade III hemorrhoids 10/06/2016  . Tachycardia 08/14/2016  . Hemorrhoids 08/13/2016  . Atypical migraine 08/03/2016  . CAD (coronary artery disease)   . AAA (abdominal aortic aneurysm) ([place])   . Chronic combined systolic (congestive) and diastolic (congestive) heart failure ([place])   . Hyperglycemia 02/11/2016  . Generalized anxiety disorder 12/08/2015   . Panic attacks 07/28/2015  . Sensorineural hearing loss 03/27/2013  . Vitamin D deficiency 07/03/2012  . H/O aortic dissection 05/12/2012  . Obesity (BMI 30.0-34.9) 11/21/2011  . Routine general medical examination at a health care facility 09/20/2011  . S/P AVR (aortic valve replacement) 01/07/2011  . CORONARY ARTERY BYPASS GRAFT, HX OF 01/07/2011  . Arthritis 12/20/2010  . H/O dissecting abdominal aortic aneurysm repair 08/24/2010  . IRRITABLE BOWEL SYNDROME 09/09/2009  . Obstructive sleep apnea 08/04/2009  . External hemorrhoid 06/26/2009  . Osteoporosis 01/09/2009  . Hyperlipidemia 07/26/2007  . Depression with anxiety 07/26/2007  . Essential hypertension 07/26/2007  . Allergic rhinitis 07/26/2007  . Asthma 07/26/2007  . GERD 07/26/2007  . Osteoarthritis 07/26/2007  . Ocular migraine 07/26/2007  . Mild intermittent asthma without complication 84/69/6295   Janna Arch, PT, DPT   06/08/2018, 11:26 AM  [place] MAIN [facility] SERVICES [address] [place], [place], [address] Phone: [phone]   Fax:  [phone]  Name: Rachel Torres MRN: [id number] Date of Birth: 31-May-1956

## 2018-06-09 ENCOUNTER — Ambulatory Visit (INDEPENDENT_AMBULATORY_CARE_PROVIDER_SITE_OTHER): Payer: Medicare HMO | Admitting: Family Medicine

## 2018-06-09 ENCOUNTER — Encounter: Payer: Self-pay | Admitting: Family Medicine

## 2018-06-09 ENCOUNTER — Encounter: Payer: Self-pay | Admitting: *Deleted

## 2018-06-09 VITALS — BP 120/72 | HR 77 | Temp 98.9°F | Ht 65.5 in | Wt 208.2 lb

## 2018-06-09 DIAGNOSIS — M1711 Unilateral primary osteoarthritis, right knee: Secondary | ICD-10-CM | POA: Diagnosis not present

## 2018-06-09 MED ORDER — METHYLPREDNISOLONE ACETATE 40 MG/ML IJ SUSP
80.0000 mg | Freq: Once | INTRAMUSCULAR | Status: AC
  Administered 2018-06-09: 80 mg via INTRA_ARTICULAR

## 2018-06-09 NOTE — Progress Notes (Signed)
Dr. Frederico Hamman T. Harleyquinn Gasser, MD, [facility] [place], [address] Phone: [phone] Fax: [phone]  06/09/2018  Patient: Rachel Torres, MRN: [id number], DOB: 1956/03/09, 62 y.o.  Primary Physician:  Tower, Wynelle Fanny, MD   Chief Complaint  Patient presents with  . Knee Pain    Right Knee-Injeciton   Subjective:   Rachel Torres is a 62 y.o. very pleasant female patient who presents with the following:  R knee is bothering her now.  I remember the patient well.  She had 2 different significant falls and had pain after this, and I saw her several times.  Initially, she failed to improve with conservative care, and I had ordered an MRI of her knee.  Ultimately the patient did not get this, and I saw her about 8 months later.  At that point she had been doing relatively well, and I repeated a corticosteroid injection.  That was approximately 7 months ago.  She does not want to do any type of intervention at all, and would like to be as conservative as possible with her care in terms of her knee management.  R knee inj  Past Medical History, Surgical History, Social History, Family History, Problem List, Medications, and Allergies have been reviewed and updated if relevant.  Patient Active Problem List   Diagnosis Date Noted  . Cough 09/08/2017  . Type 1 dissection of ascending aorta ([place]) 04/23/2017  . HPV (human papilloma virus) infection 12/01/2016  . Screening mammogram, encounter for 11/29/2016  . Estrogen deficiency 11/29/2016  . Encounter for routine gynecological examination 11/29/2016  . Grade III hemorrhoids 10/06/2016  . Tachycardia 08/14/2016  . Hemorrhoids 08/13/2016  . Atypical migraine 08/03/2016  . CAD (coronary artery disease)   . AAA (abdominal aortic aneurysm) ([place])   . Chronic combined systolic (congestive) and diastolic (congestive) heart failure ([place])   . Hyperglycemia 02/11/2016  . Generalized  anxiety disorder 12/08/2015  . Panic attacks 07/28/2015  . Sensorineural hearing loss 03/27/2013  . Vitamin D deficiency 07/03/2012  . H/O aortic dissection 05/12/2012  . Obesity (BMI 30.0-34.9) 11/21/2011  . Routine general medical examination at a health care facility 09/20/2011  . S/P AVR (aortic valve replacement) 01/07/2011  . CORONARY ARTERY BYPASS GRAFT, HX OF 01/07/2011  . Arthritis 12/20/2010  . H/O dissecting abdominal aortic aneurysm repair 08/24/2010  . IRRITABLE BOWEL SYNDROME 09/09/2009  . Obstructive sleep apnea 08/04/2009  . External hemorrhoid 06/26/2009  . Osteoporosis 01/09/2009  . Hyperlipidemia 07/26/2007  . Depression with anxiety 07/26/2007  . Essential hypertension 07/26/2007  . Allergic rhinitis 07/26/2007  . Asthma 07/26/2007  . GERD 07/26/2007  . Osteoarthritis 07/26/2007  . Ocular migraine 07/26/2007  . Mild intermittent asthma without complication 00/37/0488    Past Medical History:  Diagnosis Date  . AAA (abdominal aortic aneurysm) ([place])    a. Chronic w/o evidence of aneurysmal dil on CTA 01/2016.  Marland Kitchen Allergy   . Anemia   . Anxiety   . Asthma   . CAD (coronary artery disease)    a. 08/2010 s/p CABG x 1 (VG->RCA) @ time of Ao dissection repair; b. 01/2016 Lexiscan MV: mid antsept/apical defect w/ ? peri-infarct ischemia-->likely attenuation-->Med Rx.  . Chronic combined systolic (congestive) and diastolic (congestive) heart failure ([place])    a. 2012 EF 30-35%; b. 04/2015 EF 40-45%; c. 01/2016 Echo: Ef 55-65%, nl AoV bioprosthesis, nl RV, nl PASP.  Marland Kitchen Depression   . Gallbladder sludge   .  GERD (gastroesophageal reflux disease)   . History of Bicuspid Aortic Valve    a. 08/2010 s/p AVR @ time of Ao dissection repair; b. 01/2016 Echo: Ef 55-65%, nl AoV bioprosthesis, nl RV, nl PASP.  Marland Kitchen Hx of repair of dissecting thoracic aortic aneurysm, Stanford type A    a. 08/2010 s/p repair with AVR and VG->RCA; b. 01/2016 CTA: stable appearance of Asc Thoracic Aortic  graft. Opacification of flase lumen of chronic abd Ao dissection w/ retrograde flow through lumbar arteries. No aneurysmal dil of flase lumen.  . Hyperlipidemia   . Hypertensive heart disease   . IBS (irritable bowel syndrome)   . Internal hemorrhoids   . Marfan syndrome    pt denies  . Migraine   . Obstructive sleep apnea   . Osteoporosis   . Pneumonia   . RA (rheumatoid arthritis) ([place])    a. Followed by Dr. [name]    Past Surgical History:  Procedure Laterality Date  . ABDOMINAL AORTIC ANEURYSM REPAIR    . CARDIAC CATHETERIZATION  2001  . [place]  . CHOLECYSTECTOMY    . COLONOSCOPY    . FRACTURE SURGERY Right    shoulder replacement  . HEMORRHOID BANDING    . ORIF DISTAL RADIUS FRACTURE Left   . UPPER GASTROINTESTINAL ENDOSCOPY      Social History   Socioeconomic History  . Marital status: Married    Spouse name: Not on file  . Number of children: 2  . Years of education: Not on file  . Highest education level: Not on file  Occupational History  . Occupation: disabled    Employer: [employer]  Social Needs  . Financial resource strain: Not on file  . Food insecurity:    Worry: Not on file    Inability: Not on file  . Transportation needs:    Medical: Not on file    Non-medical: Not on file  Tobacco Use  . Smoking status: Former Smoker    Last attempt to quit: 12/20/1978    Years since quitting: 39.4  . Smokeless tobacco: Never Used  Substance and Sexual Activity  . Alcohol use: No    Alcohol/week: 0.0 oz  . Drug use: No  . Sexual activity: Never  Lifestyle  . Physical activity:    Days per week: Not on file    Minutes per session: Not on file  . Stress: Not on file  Relationships  . Social connections:    Talks on phone: Not on file    Gets together: Not on file    Attends religious service: Not on file    Active member of club or organization: Not on file    Attends meetings of clubs or organizations: Not on file     Relationship status: Not on file  . Intimate partner violence:    Fear of current or ex partner: Not on file    Emotionally abused: Not on file    Physically abused: Not on file    Forced sexual activity: Not on file  Other Topics Concern  . Not on file  Social History Narrative    married for the second time, happily   On disability after dissection of aortic aneurysm   Husband has Parkinson's - deteriorating w/ dementia   4-6 caffeinated beverages daily   01/25/2018          Family History  Problem Relation Age of Onset  . Hypertension Mother   . Depression Mother   .  Osteoporosis Mother   . Stroke Mother   . Irritable bowel syndrome Mother   . Asthma Mother   . Arthritis Sister   . Diabetes Sister   . Heart disease Sister   . Asthma Sister   . Migraines Sister   . Nephrolithiasis Sister   . Osteoporosis Sister   . Arthritis Sister   . Asthma Sister   . Migraines Sister   . Other Father 31  . Stroke Maternal Grandmother   . Colon cancer Maternal Grandmother   . Lymphoma Maternal Grandmother   . Migraines Maternal Grandmother   . Aneurysm Paternal Grandmother        brain  . Diabetes Paternal Grandmother   . Arthritis Paternal Grandmother   . Arthritis Paternal Grandfather   . Diabetes Paternal Grandfather   . Other Brother        prediabeties  . Asthma Brother   . Migraines Brother   . Lung cancer Maternal Grandfather   . Melanoma Maternal Grandfather     Allergies  Allergen Reactions  . Fosamax [Alendronate Sodium]     GI upset   . Ginzing [Ginseng] Other (See Comments)    Hyper and jitery   . Hydrocod Polst-Cpm Polst Er Itching  . Hydrocodone-Acetaminophen Itching  . Imitrex [Sumatriptan]     Contraindicated w/ heart condition  . Oxycodone Itching  . Peanut-Containing Drug Products Other (See Comments)    Reaction:  Migraines   . Tramadol Other (See Comments)    itching  . Hydrocodone-Guaifenesin Itching  . Prednisone Rash  . Tape Itching     Medication list reviewed and updated in full in [place].  GEN: No fevers, chills. Nontoxic. Primarily MSK c/o today. MSK: Detailed in the HPI GI: tolerating PO intake without difficulty Neuro: No numbness, parasthesias, or tingling associated. Otherwise the pertinent positives of the ROS are noted above.   Objective:   BP 120/72   Pulse 77   Temp 98.9 F (37.2 C) (Oral)   Ht 5' 5.5" (1.664 m)   Wt 208 lb 4 oz (94.5 kg)   BMI 34.13 kg/m    GEN: WDWN, NAD, Non-toxic, Alert & Oriented x 3 HEENT: Atraumatic, Normocephalic.  Ears and Nose: No external deformity. EXTR: No clubbing/cyanosis/edema NEURO: Normal gait.  PSYCH: Normally interactive. Conversant. Not depressed or anxious appearing.  Calm demeanor.   Knee:  R Gait: Normal heel toe pattern ROM: 0-120 Effusion: minimal to mild Echymosis or edema: none Patellar tendon NT Painful PLICA: neg Patellar grind: negative Medial and lateral patellar facet loading: negative medial and lateral joint lines: medial > lateral Mcmurray's neg Flexion-pinch neg Varus and valgus stress: stable Lachman: neg Ant and Post drawer: neg Hip abduction, IR, ER: WNL Hip flexion str: 5/5 Hip abd: 5/5 Quad: 5/5 VMO atrophy:No Hamstring concentric and eccentric: 5/5   Radiology: No results found.   Assessment and Plan:   Primary osteoarthritis of right knee - Plan: methylPREDNISolone acetate (DEPO-MEDROL) injection 80 mg  Her knee exam actually looks better, and she is having some pain currently.  We discussed that I cannot really rule out internal derangement of her knee, but ligamentous structures all appear to be intact.  She wants to be conservative if at all possible, and I think that that is reasonable.  We are going to reinject her knee today with corticosteroid to try to get her some symptomatic relief.  Knee Injection, R Patient verbally consented to procedure. Risks (including potential rare risk of infection),  benefits, and alternatives explained. Sterilely prepped with Chloraprep. Ethyl cholride used for anesthesia. 8 cc Lidocaine 1% mixed with 2 mL Depo-Medrol 40 mg injected using the anteromedial approach without difficulty. No complications with procedure and tolerated well. Patient had decreased pain post-injection.   Follow-up: prn  Meds ordered this encounter  Medications  . methylPREDNISolone acetate (DEPO-MEDROL) injection 80 mg   Signed,  Aleni Andrus T. Chuong Casebeer, MD   Allergies as of 06/09/2018      Reactions   Fosamax [alendronate Sodium]    GI upset   Ginzing [ginseng] Other (See Comments)   Hyper and jitery    Hydrocod Polst-cpm Polst Er Itching   Hydrocodone-acetaminophen Itching   Imitrex [sumatriptan]    Contraindicated w/ heart condition   Oxycodone Itching   Peanut-containing Drug Products Other (See Comments)   Reaction:  Migraines    Tramadol Other (See Comments)   itching   Hydrocodone-guaifenesin Itching   Prednisone Rash   Tape Itching      Medication List        Accurate as of 06/09/18 11:59 PM. Always use your most recent med list.          albuterol 108 (90 Base) MCG/ACT inhaler Commonly known as:  PROVENTIL HFA;VENTOLIN HFA Inhale 1-2 puffs into the lungs every 6 (six) hours as needed for wheezing or shortness of breath.   ALPRAZolam 0.5 MG tablet Commonly known as:  XANAX Take 1 tablet (0.5 mg total) by mouth daily as needed for anxiety.   aspirin EC 81 MG tablet Take 81 mg by mouth at bedtime.   atorvastatin 20 MG tablet Commonly known as:  LIPITOR TAKE 1 TABLET (20 MG TOTAL) BY MOUTH DAILY.   b complex vitamins tablet Take 1 tablet by mouth daily.   benzonatate 200 MG capsule Commonly known as:  TESSALON Take 1 capsule (200 mg total) by mouth 3 (three) times daily as needed.   BIOTIN PO Take 1 capsule by mouth daily.   carvedilol 6.25 MG tablet Commonly known as:  COREG Take 1 tablet (6.25 mg total) by mouth 2 (two) times daily with  a meal.   cyclobenzaprine 10 MG tablet Commonly known as:  FLEXERIL Take 0.5-1 tablets (5-10 mg total) by mouth 3 (three) times daily as needed. For headache   furosemide 20 MG tablet Commonly known as:  LASIX TAKE 1 TABLET EVERY DAY   gabapentin 100 MG capsule Commonly known as:  NEURONTIN Take 3 capsules (300 mg total) by mouth at bedtime.   hydrocortisone 2.5 % cream Apply topically 2 (two) times daily. For maximum 7 days.   lisinopril 5 MG tablet Commonly known as:  PRINIVIL,ZESTRIL TAKE 1/2 TABLET (2.5 MG TOTAL) BY MOUTH DAILY.   magnesium gluconate 500 MG tablet Commonly known as:  MAGONATE Take 500 mg by mouth at bedtime.   naproxen sodium 220 MG tablet Commonly known as:  ALEVE Take 440 mg by mouth 2 (two) times daily as needed (for headaches/pain).   nitroGLYCERIN 0.4 MG SL tablet Commonly known as:  NITROSTAT Place 1 tablet (0.4 mg total) under the tongue every 5 (five) minutes as needed for chest pain.   Omega-3 500 MG Caps Take 1 capsule by mouth daily.   omeprazole 40 MG capsule Commonly known as:  PRILOSEC Take 1 capsule (40 mg total) by mouth daily.   potassium chloride 10 MEQ tablet Commonly known as:  K-DUR,KLOR-CON Take 1 tablet (10 mEq total) by mouth 2 (two) times daily as needed.   promethazine  25 MG tablet Commonly known as:  PHENERGAN Take 0.5 tablets (12.5 mg total) by mouth every 8 (eight) hours as needed for nausea.   TART CHERRY ADVANCED Caps Take 1 capsule by mouth daily.   venlafaxine XR 150 MG 24 hr capsule Commonly known as:  EFFEXOR-XR TAKE 1 CAPSULE EVERY DAY WITH BREAKFAST   Vitamin D3 5000 units Caps Take 1 capsule by mouth daily.

## 2018-06-12 ENCOUNTER — Ambulatory Visit: Payer: Medicare HMO

## 2018-06-12 DIAGNOSIS — M5412 Radiculopathy, cervical region: Secondary | ICD-10-CM | POA: Diagnosis not present

## 2018-06-12 DIAGNOSIS — M542 Cervicalgia: Secondary | ICD-10-CM | POA: Diagnosis not present

## 2018-06-12 DIAGNOSIS — R293 Abnormal posture: Secondary | ICD-10-CM | POA: Diagnosis not present

## 2018-06-12 NOTE — Therapy (Signed)
[place] MAIN [facility] SERVICES [address] [place], [place], [address] Phone: [phone]   Fax:  [phone]  Physical Therapy Treatment  Patient Details  Name: Rachel Torres MRN: [id number] Date of Birth: 1956-07-27 Referring Provider: Mallie Mussel pool   Encounter Date: 06/12/2018  PT End of Session - 06/12/18 0923    Visit Number  24    Number of Visits  26    Date for PT Re-Evaluation  06/15/18    PT Start Time  0835    PT Stop Time  0921    PT Time Calculation (min)  46 min    Activity Tolerance  Treatment limited secondary to medical complications (Comment);Patient tolerated treatment well    Behavior During Therapy  [facility] for tasks assessed/performed       Past Medical History:  Diagnosis Date  . AAA (abdominal aortic aneurysm) ([place])    a. Chronic w/o evidence of aneurysmal dil on CTA 01/2016.  Marland Kitchen Allergy   . Anemia   . Anxiety   . Asthma   . CAD (coronary artery disease)    a. 08/2010 s/p CABG x 1 (VG->RCA) @ time of Ao dissection repair; b. 01/2016 Lexiscan MV: mid antsept/apical defect w/ ? peri-infarct ischemia-->likely attenuation-->Med Rx.  . Chronic combined systolic (congestive) and diastolic (congestive) heart failure ([place])    a. 2012 EF 30-35%; b. 04/2015 EF 40-45%; c. 01/2016 Echo: Ef 55-65%, nl AoV bioprosthesis, nl RV, nl PASP.  Marland Kitchen Depression   . Gallbladder sludge   . GERD (gastroesophageal reflux disease)   . History of Bicuspid Aortic Valve    a. 08/2010 s/p AVR @ time of Ao dissection repair; b. 01/2016 Echo: Ef 55-65%, nl AoV bioprosthesis, nl RV, nl PASP.  Marland Kitchen Hx of repair of dissecting thoracic aortic aneurysm, Stanford type A    a. 08/2010 s/p repair with AVR and VG->RCA; b. 01/2016 CTA: stable appearance of Asc Thoracic Aortic graft. Opacification of flase lumen of chronic abd Ao dissection w/ retrograde flow through lumbar arteries. No aneurysmal dil of flase lumen.  . Hyperlipidemia   . Hypertensive heart disease   .  IBS (irritable bowel syndrome)   . Internal hemorrhoids   . Marfan syndrome    pt denies  . Migraine   . Obstructive sleep apnea   . Osteoporosis   . Pneumonia   . RA (rheumatoid arthritis) ([place])    a. Followed by Dr. [name]    Past Surgical History:  Procedure Laterality Date  . ABDOMINAL AORTIC ANEURYSM REPAIR    . CARDIAC CATHETERIZATION  2001  . [place]  . CHOLECYSTECTOMY    . COLONOSCOPY    . FRACTURE SURGERY Right    shoulder replacement  . HEMORRHOID BANDING    . ORIF DISTAL RADIUS FRACTURE Left   . UPPER GASTROINTESTINAL ENDOSCOPY      There were no vitals filed for this visit.  Subjective Assessment - 06/12/18 0838    Subjective  Patient got migraines this weekend from new glasses. Husband is starting to forget who she is from diagnosis. Patient carries tension in neck causing increase in neck pain.     Pertinent History  Patient is a pleasant 62 year old female who presents with cervical neck pain with radiating symptoms to bilateral fingers. 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  Has high stress levels due to caregiver support. Patient has gained 30 lb.  Has AAA  and numbness began seven years ago when aneurysm occured. Sleeping with a neck pillow to relieve pain. Pain has been occurring for years.     Limitations  Sitting;Lifting;Standing;Walking;Writing;House hold activities;Other (comment);Reading    How long can you sit comfortably?  an hour     How long can you stand comfortably?  n/a    How long can you walk comfortably?  n/a    Diagnostic tests  n/a    Patient Stated Goals  decrease pain.     Currently in Pain?  Yes    Pain Score  4     Pain Location  Shoulder    Pain Orientation  Right;Posterior    Pain Descriptors / Indicators  Aching    Pain Type  Chronic pain    Pain Onset  More than a month ago    Pain Frequency  Constant       Sidebending with overpressure stretch 5x 20 seconds  with R more tight than L Rotation l and R with overpressure stretch 5x20 seconds with R more tight than L.  Suboccipital release 4x 20 seconds    Grade I-III CPA and UPAs c2-T1 hypomobile throughout with pain relief upon L C4 ; 4x10 seconds each level Thoracic J sweep against thoracic curvature mobilizations grade I-III 5x10 seconds      ULT median nerve glide supine 10x RUE ULT radial nerve glide supine 10x RUE       STM cervical and upper paraspinal; tighter on R cervical and upper thoracic region in seated position with head on table 7 minutes   Trigger point/soft tissue release with movement to improve soft tissue limited muscle tissue length. 5 minutes   Seated scapular retractions 20x    Pt. response to medical necessity:  Patient will continue to benefit from skilled physical therapy to reduce pain, improve cervical mobility, and allow patient better QOL   Pain reduced to 1/10 after manual                           PT Education - 06/12/18 0923    Education provided  Yes    Education Details  manual, posture     Person(s) Educated  Patient    Methods  Explanation;Demonstration;Verbal cues    Comprehension  Verbalized understanding;Returned demonstration       PT Short Term Goals - 05/18/18 0824      PT SHORT TERM GOAL #1   Title  Patient will be independent in home exercise program to improve strength/mobility for better functional independence with ADLs.    Baseline  HEP compliance    Time  2    Period  Weeks    Status  Achieved      PT SHORT TERM GOAL #2   Title  Patient will report a worst pain of 5/10 on VAS in neck to improve tolerance with ADLs and reduced symptoms with activities.     Baseline  3/4: worst pain 8/10; 4/3: 6/10 4/30: 7/10 tension headache 5/30 7/10 when lifting husband from floor    Time  2    Period  Weeks    Status  Partially Met    Target Date  06/01/18        PT Long Term Goals - 05/18/18 0825      PT LONG  TERM GOAL #1   Title  Patient will report a worst pain of 3/10 on VAS in cervical spine to improve tolerance  with ADLs and reduced symptoms with activities    Baseline  worst pain 8/10; 4/3: 6/10 4/30: 7/10 tension headache from caregiver. 5/30: 7/10 from lifting husband    Time  4    Period  Weeks    Status  Partially Met      PT LONG TERM GOAL #2   Title  Patient will have no tingling in bilateral hands for two consecutive weeks to improve quality of life.     Baseline   4/3: had no tingling for about a week, came back this week 3x due to husband in the hospital. 4/30: 2x a week. 5/30: no tingling.     Time  4    Period  Weeks    Status  Achieved      PT LONG TERM GOAL #3   Title  Patient will decrease NDI score to 28% to decrease level of perceived disability to minimal.     Baseline  3/4: 46%; 4/3: 44%: 4/30: 54% 5/30: 40%     Time  4    Period  Weeks    Status  Partially Met    Target Date  06/15/18      PT LONG TERM GOAL #4   Title  Patient will improve cervical rotation ROM to 60 degrees bilaterally to improve functional mobility.     Baseline  3/4: R 39 L 41; 4/3: R 42 , L 46 4/30: supine L55 R 54 ; 5/30: seated L 55 R 56    Time  4    Period  Weeks    Status  Partially Met    Target Date  06/15/18      PT LONG TERM GOAL #5   Title  Patient will have reduced occurances of high levels of pain to improve quality of life    Baseline  initially was everyday now is 2x/week    Time  4    Period  Weeks    Status  New            Plan - 06/12/18 0254    Clinical Impression Statement  Patient demonstrates muscle tissue length improvement after manual resulting in reduction of pain to 1/10 indicating musculoskeletal tissue involvement with pain and tension.   Patient will continue to benefit from skilled physical therapy to reduce pain, improve cervical mobility, and allow patient better QOL    Rehab Potential  Fair    Clinical Impairments Affecting Rehab Potential  + CAD  testing, history of AAA, anemia, anxiety, asthma, CAD, chronic combined ystolic heart failure, GERD, Marfan syndrome, OA, RA (+) good work Psychologist, forensic, motivated, age     PT Frequency  2x / week    PT Duration  4 weeks    PT Treatment/Interventions  ADLs/Self Care Home Management;Cryotherapy;Traction;Ultrasound;Therapeutic exercise;Therapeutic activities;Neuromuscular re-education;Patient/family education;Manual techniques;Passive range of motion;Dry needling;Energy conservation;Visual/perceptual remediation/compensation    PT Next Visit Plan  nerve glides, muscle tissue length, posture    PT Home Exercise Plan  give next session.    Consulted and Agree with Plan of Care  Patient       Patient will benefit from skilled therapeutic intervention in order to improve the following deficits and impairments:  Decreased activity tolerance, Decreased safety awareness, Decreased range of motion, Decreased mobility, Decreased strength, Impaired flexibility, Postural dysfunction, Improper body mechanics, Pain  Visit Diagnosis: Radiculopathy, cervical region  Abnormal posture  Cervicalgia     Problem List Patient Active Problem List   Diagnosis Date Noted  .  Cough 09/08/2017  . Type 1 dissection of ascending aorta ([place]) 04/23/2017  . HPV (human papilloma virus) infection 12/01/2016  . Screening mammogram, encounter for 11/29/2016  . Estrogen deficiency 11/29/2016  . Encounter for routine gynecological examination 11/29/2016  . Grade III hemorrhoids 10/06/2016  . Tachycardia 08/14/2016  . Hemorrhoids 08/13/2016  . Atypical migraine 08/03/2016  . CAD (coronary artery disease)   . AAA (abdominal aortic aneurysm) ([place])   . Chronic combined systolic (congestive) and diastolic (congestive) heart failure ([place])   . Hyperglycemia 02/11/2016  . Generalized anxiety disorder 12/08/2015  . Panic attacks 07/28/2015  . Sensorineural hearing loss 03/27/2013  . Vitamin D deficiency 07/03/2012  . H/O aortic  dissection 05/12/2012  . Obesity (BMI 30.0-34.9) 11/21/2011  . Routine general medical examination at a health care facility 09/20/2011  . S/P AVR (aortic valve replacement) 01/07/2011  . CORONARY ARTERY BYPASS GRAFT, HX OF 01/07/2011  . Arthritis 12/20/2010  . H/O dissecting abdominal aortic aneurysm repair 08/24/2010  . IRRITABLE BOWEL SYNDROME 09/09/2009  . Obstructive sleep apnea 08/04/2009  . External hemorrhoid 06/26/2009  . Osteoporosis 01/09/2009  . Hyperlipidemia 07/26/2007  . Depression with anxiety 07/26/2007  . Essential hypertension 07/26/2007  . Allergic rhinitis 07/26/2007  . Asthma 07/26/2007  . GERD 07/26/2007  . Osteoarthritis 07/26/2007  . Ocular migraine 07/26/2007  . Mild intermittent asthma without complication 24/79/9800   Janna Arch, PT, DPT   06/12/2018, 9:26 AM  [place] MAIN [facility] SERVICES [address] [place], [place], [address] Phone: [phone]   Fax:  [phone]  Name: ARYANNE GILLELAND MRN: [id number] Date of Birth: February 14, 1956

## 2018-06-15 ENCOUNTER — Ambulatory Visit: Payer: Medicare HMO

## 2018-06-15 ENCOUNTER — Ambulatory Visit (INDEPENDENT_AMBULATORY_CARE_PROVIDER_SITE_OTHER): Payer: Medicare HMO | Admitting: Psychology

## 2018-06-15 DIAGNOSIS — R293 Abnormal posture: Secondary | ICD-10-CM | POA: Diagnosis not present

## 2018-06-15 DIAGNOSIS — F4323 Adjustment disorder with mixed anxiety and depressed mood: Secondary | ICD-10-CM | POA: Diagnosis not present

## 2018-06-15 DIAGNOSIS — M542 Cervicalgia: Secondary | ICD-10-CM

## 2018-06-15 DIAGNOSIS — M5412 Radiculopathy, cervical region: Secondary | ICD-10-CM

## 2018-06-15 NOTE — Therapy (Signed)
[place] MAIN [facility] SERVICES [address] [place], [place], [address] Phone: [phone]   Fax:  [phone]  Physical Therapy Treatment Physical Therapy Progress Note   Dates of reporting period  05/18/18   to   06/15/18   Patient Details  Name: Rachel Torres MRN: [id number] Date of Birth: 08/21/1956 Referring Provider: Mallie Mussel pool   Encounter Date: 06/15/2018  PT End of Session - 06/15/18 1159    Visit Number  25    Number of Visits  34    Date for PT Re-Evaluation  07/13/18    PT Start Time  0846    PT Stop Time  0930    PT Time Calculation (min)  44 min    Activity Tolerance  Treatment limited secondary to medical complications (Comment);Patient tolerated treatment well    Behavior During Therapy  [facility] for tasks assessed/performed       Past Medical History:  Diagnosis Date  . AAA (abdominal aortic aneurysm) ([place])    a. Chronic w/o evidence of aneurysmal dil on CTA 01/2016.  Marland Kitchen Allergy   . Anemia   . Anxiety   . Asthma   . CAD (coronary artery disease)    a. 08/2010 s/p CABG x 1 (VG->RCA) @ time of Ao dissection repair; b. 01/2016 Lexiscan MV: mid antsept/apical defect w/ ? peri-infarct ischemia-->likely attenuation-->Med Rx.  . Chronic combined systolic (congestive) and diastolic (congestive) heart failure ([place])    a. 2012 EF 30-35%; b. 04/2015 EF 40-45%; c. 01/2016 Echo: Ef 55-65%, nl AoV bioprosthesis, nl RV, nl PASP.  Marland Kitchen Depression   . Gallbladder sludge   . GERD (gastroesophageal reflux disease)   . History of Bicuspid Aortic Valve    a. 08/2010 s/p AVR @ time of Ao dissection repair; b. 01/2016 Echo: Ef 55-65%, nl AoV bioprosthesis, nl RV, nl PASP.  Marland Kitchen Hx of repair of dissecting thoracic aortic aneurysm, Stanford type A    a. 08/2010 s/p repair with AVR and VG->RCA; b. 01/2016 CTA: stable appearance of Asc Thoracic Aortic graft. Opacification of flase lumen of chronic abd Ao dissection w/ retrograde flow through lumbar arteries.  No aneurysmal dil of flase lumen.  . Hyperlipidemia   . Hypertensive heart disease   . IBS (irritable bowel syndrome)   . Internal hemorrhoids   . Marfan syndrome    pt denies  . Migraine   . Obstructive sleep apnea   . Osteoporosis   . Pneumonia   . RA (rheumatoid arthritis) ([place])    a. Followed by Dr. [name]    Past Surgical History:  Procedure Laterality Date  . ABDOMINAL AORTIC ANEURYSM REPAIR    . CARDIAC CATHETERIZATION  2001  . [place]  . CHOLECYSTECTOMY    . COLONOSCOPY    . FRACTURE SURGERY Right    shoulder replacement  . HEMORRHOID BANDING    . ORIF DISTAL RADIUS FRACTURE Left   . UPPER GASTROINTESTINAL ENDOSCOPY      There were no vitals filed for this visit.  Subjective Assessment - 06/15/18 0905    Subjective  Patient has had signficant stress this past week with one reported break down after husband's mental status is deteriorating. Reports her neck has been improving with decreased worst pain.     Pertinent History  Patient is a pleasant 62 year old female who presents with cervical neck pain with radiating symptoms to bilateral fingers. 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  Has high stress levels due to caregiver support. Patient has gained 30 lb.  Has AAA and numbness began seven years ago when aneurysm occured. Sleeping with a neck pillow to relieve pain. Pain has been occurring for years.     Limitations  Sitting;Lifting;Standing;Walking;Writing;House hold activities;Other (comment);Reading    How long can you sit comfortably?  an hour     How long can you stand comfortably?  n/a    How long can you walk comfortably?  n/a    Diagnostic tests  n/a    Patient Stated Goals  decrease pain.     Currently in Pain?  Yes    Pain Score  2     Pain Location  Neck    Pain Orientation  Posterior;Mid    Pain Descriptors / Indicators  Aching    Pain Type  Chronic pain    Pain Onset  More than a  month ago    Pain Frequency  Constant      Patient's condition has the potential to improve in response to therapy. Maximum improvement is yet to be obtained. The anticipated improvement is attainable and reasonable in a generally predictable time. Start date of reporting period 05/18/18 end date of reporting period 06/15/18. Patient reports that she is improving every week but her caregiver duties are challenging which is making her progress slow. Is having pain less frequently and less intensely with decreased neurological signs.   VAS: worst 5/10  NDI: 32% Cervical rotation Pain occurrences: 2x/week    Sidebending with overpressure stretch 5x 20 seconds with R more tight than L Rotation l and R with overpressure stretch 5x20 seconds with R more tight than L.  Suboccipital release 4x 20 seconds    Trigger point/soft tissue release with movement to improve soft tissue limited muscle tissue length. 5 minutes   Seated scapular retractions 20x    Pt. response to medical necessity:  Patient will continue to benefit from skilled physical therapy to reduce pain, improve cervical mobility, and allow patient better QOL                       PT Education - 06/15/18 1156    Education provided  Yes    Education Details  goal progression, posture,    Person(s) Educated  Patient    Methods  Explanation;Demonstration;Verbal cues    Comprehension  Verbalized understanding;Returned demonstration       PT Short Term Goals - 06/15/18 0901      PT SHORT TERM GOAL #1   Title  Patient will be independent in home exercise program to improve strength/mobility for better functional independence with ADLs.    Baseline  HEP compliance    Time  2    Period  Weeks    Status  Achieved      PT SHORT TERM GOAL #2   Title  Patient will report a worst pain of 5/10 on VAS in neck to improve tolerance with ADLs and reduced symptoms with activities.     Baseline  3/4: worst pain 8/10; 4/3:  6/10 4/30: 7/10 tension headache 5/30 7/10 when lifting husband from floor; 6/27: 5/10     Time  2    Period  Weeks    Status  Achieved        PT Long Term Goals - 06/15/18 0902      PT LONG TERM GOAL #1   Title  Patient will report  a worst pain of 3/10 on VAS in cervical spine to improve tolerance with ADLs and reduced symptoms with activities    Baseline  worst pain 8/10; 4/3: 6/10 4/30: 7/10 tension headache from caregiver. 5/30: 7/10 from lifting husband; 6/27: 5/10     Time  4    Period  Weeks    Status  Partially Met    Target Date  07/13/18      PT LONG TERM GOAL #2   Title  Patient will have no tingling in bilateral hands for two consecutive weeks to improve quality of life.     Baseline   4/3: had no tingling for about a week, came back this week 3x due to husband in the hospital. 4/30: 2x a week. 5/30: no tingling.     Time  4    Period  Weeks    Status  Achieved      PT LONG TERM GOAL #3   Title  Patient will decrease NDI score to 28% to decrease level of perceived disability to minimal.     Baseline  3/4: 46%; 4/3: 44%: 4/30: 54% 5/30: 40% 6/27: 32%    Time  4    Period  Weeks    Status  Partially Met    Target Date  07/13/18      PT LONG TERM GOAL #4   Title  Patient will improve cervical rotation ROM to 60 degrees bilaterally to improve functional mobility.     Baseline  3/4: R 39 L 41; 4/3: R 42 , L 46 4/30: supine L55 R 54 ; 5/30: seated L 55 R 56; 6/27: L 60 , R 56    Time  4    Period  Weeks    Status  Partially Met    Target Date  07/13/18      PT LONG TERM GOAL #5   Title  Patient will have reduced occurances of high levels of pain to improve quality of life    Baseline  initially was everyday now is 2x/week; 6/27: 2x/week     Time  4    Period  Weeks    Status  Partially Met    Target Date  07/13/18            Plan - 06/15/18 1202    Clinical Impression Statement  Patient progressing towards all goals at this time with worse VAS improving  to 5/10 from previous 7/10, and incidence of high pain only occurring 2x/week. Patient ROM improving and NDI was 32%.Although patient is compliant with HEP, progress is slow due to caregiver duties . Patient's condition has the potential to improve in response to therapy. Maximum improvement is yet to be obtained. The anticipated improvement is attainable and reasonable in a generally predictable time.  Patient will continue to benefit from skilled physical therapy to reduce pain, improve cervical mobility, and allow patient better QOL    Rehab Potential  Fair    Clinical Impairments Affecting Rehab Potential  + CAD testing, history of AAA, anemia, anxiety, asthma, CAD, chronic combined ystolic heart failure, GERD, Marfan syndrome, OA, RA (+) good work Psychologist, forensic, motivated, age     PT Frequency  2x / week    PT Duration  4 weeks    PT Treatment/Interventions  ADLs/Self Care Home Management;Cryotherapy;Traction;Ultrasound;Therapeutic exercise;Therapeutic activities;Neuromuscular re-education;Patient/family education;Manual techniques;Passive range of motion;Dry needling;Energy conservation;Visual/perceptual remediation/compensation    PT Next Visit Plan  nerve glides, muscle tissue length, posture    PT  Home Exercise Plan  give next session.    Consulted and Agree with Plan of Care  Patient       Patient will benefit from skilled therapeutic intervention in order to improve the following deficits and impairments:  Decreased activity tolerance, Decreased safety awareness, Decreased range of motion, Decreased mobility, Decreased strength, Impaired flexibility, Postural dysfunction, Improper body mechanics, Pain  Visit Diagnosis: Radiculopathy, cervical region  Abnormal posture  Cervicalgia     Problem List Patient Active Problem List   Diagnosis Date Noted  . Cough 09/08/2017  . Type 1 dissection of ascending aorta ([place]) 04/23/2017  . HPV (human papilloma virus) infection 12/01/2016  .  Screening mammogram, encounter for 11/29/2016  . Estrogen deficiency 11/29/2016  . Encounter for routine gynecological examination 11/29/2016  . Grade III hemorrhoids 10/06/2016  . Tachycardia 08/14/2016  . Hemorrhoids 08/13/2016  . Atypical migraine 08/03/2016  . CAD (coronary artery disease)   . AAA (abdominal aortic aneurysm) ([place])   . Chronic combined systolic (congestive) and diastolic (congestive) heart failure ([place])   . Hyperglycemia 02/11/2016  . Generalized anxiety disorder 12/08/2015  . Panic attacks 07/28/2015  . Sensorineural hearing loss 03/27/2013  . Vitamin D deficiency 07/03/2012  . H/O aortic dissection 05/12/2012  . Obesity (BMI 30.0-34.9) 11/21/2011  . Routine general medical examination at a health care facility 09/20/2011  . S/P AVR (aortic valve replacement) 01/07/2011  . CORONARY ARTERY BYPASS GRAFT, HX OF 01/07/2011  . Arthritis 12/20/2010  . H/O dissecting abdominal aortic aneurysm repair 08/24/2010  . IRRITABLE BOWEL SYNDROME 09/09/2009  . Obstructive sleep apnea 08/04/2009  . External hemorrhoid 06/26/2009  . Osteoporosis 01/09/2009  . Hyperlipidemia 07/26/2007  . Depression with anxiety 07/26/2007  . Essential hypertension 07/26/2007  . Allergic rhinitis 07/26/2007  . Asthma 07/26/2007  . GERD 07/26/2007  . Osteoarthritis 07/26/2007  . Ocular migraine 07/26/2007  . Mild intermittent asthma without complication 10/22/1593   Janna Arch, PT, DPT   06/15/2018, 12:03 PM  [place] MAIN [facility] SERVICES [address] [place], [place], [address] Phone: [phone]   Fax:  [phone]  Name: YIZEL CANBY MRN: [id number] Date of Birth: [birth year]

## 2018-06-16 ENCOUNTER — Encounter: Payer: Self-pay | Admitting: Family Medicine

## 2018-06-18 ENCOUNTER — Telehealth: Payer: Self-pay | Admitting: Family Medicine

## 2018-06-18 DIAGNOSIS — E7800 Pure hypercholesterolemia, unspecified: Secondary | ICD-10-CM

## 2018-06-18 DIAGNOSIS — I10 Essential (primary) hypertension: Secondary | ICD-10-CM

## 2018-06-18 DIAGNOSIS — E559 Vitamin D deficiency, unspecified: Secondary | ICD-10-CM

## 2018-06-18 DIAGNOSIS — R739 Hyperglycemia, unspecified: Secondary | ICD-10-CM

## 2018-06-18 NOTE — Telephone Encounter (Signed)
-----   Message from Ellamae Sia sent at 06/14/2018 10:30 AM EDT ----- Regarding: Lab orders for Monday, 7.8.19 Lab orders for a 6 month follow up appt

## 2018-06-19 ENCOUNTER — Ambulatory Visit (INDEPENDENT_AMBULATORY_CARE_PROVIDER_SITE_OTHER): Payer: Medicare HMO | Admitting: Psychology

## 2018-06-19 DIAGNOSIS — F4323 Adjustment disorder with mixed anxiety and depressed mood: Secondary | ICD-10-CM | POA: Diagnosis not present

## 2018-06-20 ENCOUNTER — Encounter: Payer: Self-pay | Admitting: Family Medicine

## 2018-06-21 ENCOUNTER — Ambulatory Visit: Payer: Medicare HMO | Attending: Neurosurgery

## 2018-06-21 DIAGNOSIS — M542 Cervicalgia: Secondary | ICD-10-CM | POA: Insufficient documentation

## 2018-06-21 DIAGNOSIS — R293 Abnormal posture: Secondary | ICD-10-CM | POA: Insufficient documentation

## 2018-06-21 DIAGNOSIS — M546 Pain in thoracic spine: Secondary | ICD-10-CM | POA: Diagnosis not present

## 2018-06-21 DIAGNOSIS — M5412 Radiculopathy, cervical region: Secondary | ICD-10-CM | POA: Diagnosis not present

## 2018-06-21 NOTE — Therapy (Signed)
[place] MAIN [facility] SERVICES [address] [place], [place], [address] Phone: [phone]   Fax:  [phone]  Physical Therapy Treatment  Patient Details  Name: Rachel Torres MRN: [id number] Date of Birth: Mar 07, 1956 Referring Provider: Mallie Mussel pool   Encounter Date: 06/21/2018    Past Medical History:  Diagnosis Date  . AAA (abdominal aortic aneurysm) ([place])    a. Chronic w/o evidence of aneurysmal dil on CTA 01/2016.  Marland Kitchen Allergy   . Anemia   . Anxiety   . Asthma   . CAD (coronary artery disease)    a. 08/2010 s/p CABG x 1 (VG->RCA) @ time of Ao dissection repair; b. 01/2016 Lexiscan MV: mid antsept/apical defect w/ ? peri-infarct ischemia-->likely attenuation-->Med Rx.  . Chronic combined systolic (congestive) and diastolic (congestive) heart failure ([place])    a. 2012 EF 30-35%; b. 04/2015 EF 40-45%; c. 01/2016 Echo: Ef 55-65%, nl AoV bioprosthesis, nl RV, nl PASP.  Marland Kitchen Depression   . Gallbladder sludge   . GERD (gastroesophageal reflux disease)   . History of Bicuspid Aortic Valve    a. 08/2010 s/p AVR @ time of Ao dissection repair; b. 01/2016 Echo: Ef 55-65%, nl AoV bioprosthesis, nl RV, nl PASP.  Marland Kitchen Hx of repair of dissecting thoracic aortic aneurysm, Stanford type A    a. 08/2010 s/p repair with AVR and VG->RCA; b. 01/2016 CTA: stable appearance of Asc Thoracic Aortic graft. Opacification of flase lumen of chronic abd Ao dissection w/ retrograde flow through lumbar arteries. No aneurysmal dil of flase lumen.  . Hyperlipidemia   . Hypertensive heart disease   . IBS (irritable bowel syndrome)   . Internal hemorrhoids   . Marfan syndrome    pt denies  . Migraine   . Obstructive sleep apnea   . Osteoporosis   . Pneumonia   . RA (rheumatoid arthritis) ([place])    a. Followed by Dr. [name]    Past Surgical History:  Procedure Laterality Date  . ABDOMINAL AORTIC ANEURYSM REPAIR    . CARDIAC CATHETERIZATION  2001  . [place]  . CHOLECYSTECTOMY    . COLONOSCOPY    . FRACTURE SURGERY Right    shoulder replacement  . HEMORRHOID BANDING    . ORIF DISTAL RADIUS FRACTURE Left   . UPPER GASTROINTESTINAL ENDOSCOPY      There were no vitals filed for this visit.  Cervical pain: 4/10   New order for thoracic spine received to include with cervical spine.   Thoracic spine has been painful for years on both sides. Pain increases when bending over and reaching out such as holding laundry and groceries. Pain will radiate to front of ribs when ambulating.   Standing AROM Trunk Flexion 60; Gower sign  Trunk Extension pain  Trunk R SB 20  Trunk L SB 15 Pain on returning up  Trunk R rotation WFL  Trunk L rotation WFL    Seated AROM: lock out lumbar spine Trunk Flexion 60  Trunk Extension unable  Trunk R SB 12 pain  Trunk L SB 15  Trunk R rotation 25  Trunk L rotation 26   Rotation primarily coming from hips when standing Mobilizations:  CPA: hypomobile, painful T9-11 UPA: L T8-L1; RT2-T10  Pain in ribs 6 and down with palpation  VAS: Worst 8/10 pain Best pain: 0/10 Current pain: 2/10  Posture: Pectoral tightness : measurement from shoulder to table: L 6 R 8.25 cm.    Kyphotic  curvature excessive   Tight thoracic paraspinals   Do not mobilize T12 due to old compression fracture.     Goal: Increase thoracic mobility to allow patient to turn around and see backseat when driving.   Trigger Point Dry Needling (TDN) Education performed with patient regarding potential benefit of TDN. Reviewed precautions and risks with patient. Reviewed special precautions/risks over lung fields which include pneumothorax. Reviewed signs and symptoms of pneumothorax and advised pt to go to ER immediately if these symptoms develop advise them of dry needling treatment. Extensive time spent with pt to ensure full understanding of TDN risks. Pt provided verbal consent to treatment. TDN performed to  with 0.3 x 60  single needle. 3 placements in each upper trap bilaterally. Local twitch response (LTR) noted in 3 out of six placements with patient reporting deep ache in one additional placement. Pistoning technique utilized. Improved pain-free cervical motion following intervention. Discussed possible suboccipital TDN in future sessions to improve headaches. All TDN performed by Phillips Grout PT, DPT, GCS at the request of the primary treating therapist.                      PT Short Term Goals - 06/21/18 1607      PT SHORT TERM GOAL #1   Title  Patient will be independent in home exercise program to improve strength/mobility for better functional independence with ADLs.    Baseline  HEP compliance    Time  2    Period  Weeks    Status  Achieved      PT SHORT TERM GOAL #2   Title  Patient will report a worst pain of 5/10 on VAS in neck to improve tolerance with ADLs and reduced symptoms with activities.     Baseline  3/4: worst pain 8/10; 4/3: 6/10 4/30: 7/10 tension headache 5/30 7/10 when lifting husband from floor; 6/27: 5/10     Time  2    Period  Weeks    Status  Achieved      PT SHORT TERM GOAL #3   Title  Patient will report a worst pain of 5/10 on VAS in thoracic spine to improve tolerance with ADLs and reduced symptoms with activities.     Baseline  7/3: 8/10     Time  2    Period  Weeks    Status  New    Target Date  07/05/18        PT Long Term Goals - 06/21/18 1608      PT LONG TERM GOAL #1   Title  Patient will report a worst pain of 3/10 on VAS in cervical spine to improve tolerance with ADLs and reduced symptoms with activities    Baseline  worst pain 8/10; 4/3: 6/10 4/30: 7/10 tension headache from caregiver. 5/30: 7/10 from lifting husband; 6/27: 5/10     Time  4    Period  Weeks    Status  Partially Met      PT LONG TERM GOAL #2   Title  Patient will have no tingling in bilateral hands for two consecutive weeks to improve quality of life.      Baseline   4/3: had no tingling for about a week, came back this week 3x due to husband in the hospital. 4/30: 2x a week. 5/30: no tingling.     Time  4    Period  Weeks    Status  Achieved  PT LONG TERM GOAL #3   Title  Patient will decrease NDI score to 28% to decrease level of perceived disability to minimal.     Baseline  3/4: 46%; 4/3: 44%: 4/30: 54% 5/30: 40% 6/27: 32%    Time  4    Period  Weeks    Status  Partially Met      PT LONG TERM GOAL #4   Title  Patient will improve cervical rotation ROM to 60 degrees bilaterally to improve functional mobility.     Baseline  3/4: R 39 L 41; 4/3: R 42 , L 46 4/30: supine L55 R 54 ; 5/30: seated L 55 R 56; 6/27: L 60 , R 56    Time  4    Period  Weeks    Status  Partially Met      PT LONG TERM GOAL #5   Title  Patient will have reduced occurances of high levels of pain to improve quality of life    Baseline  initially was everyday now is 2x/week; 6/27: 2x/week     Time  4    Period  Weeks    Status  Partially Met      Additional Long Term Goals   Additional Long Term Goals  Yes      PT LONG TERM GOAL #6   Title  Patient will report a worst pain of 3/10 on VAS in thoracic spine to improve tolerance with ADLs and reduced symptoms with activities.     Baseline  7/3: 8/10     Time  4    Period  Weeks    Status  New    Target Date  07/13/18      PT LONG TERM GOAL #7   Title  Patient will improve thoracic mobility to allow patient to turn in seat when driving to see behind her for increased safety with driving.     Baseline  7/3: unable to perform     Time  4    Period  Weeks    Status  New    Target Date  07/13/18            Plan - 06/23/18 1610    Clinical Impression Statement  Patient received new thoracic orders to additionally add to current cervical orders per patient request/doctor request. Patient's thoracic spine was evaluated with tight paraspinals resulting in hypomobility of thoracic spine and additional  adhesions noted to rib attachments. Postural correction will additionally be implemented with patient demonstrated forward head rounded shoulders creating increased kyphotic curvature. Patient will continue to benefit from skilled physical therapy to reduce pain, improve cervical and thoracic mobility, and allow patient better QOL    Rehab Potential  Fair    Clinical Impairments Affecting Rehab Potential  + CAD testing, history of AAA, anemia, anxiety, asthma, CAD, chronic combined ystolic heart failure, GERD, Marfan syndrome, OA, RA (+) good work Psychologist, forensic, motivated, age     PT Frequency  2x / week    PT Duration  4 weeks    PT Treatment/Interventions  ADLs/Self Care Home Management;Cryotherapy;Traction;Ultrasound;Therapeutic exercise;Therapeutic activities;Neuromuscular re-education;Patient/family education;Manual techniques;Passive range of motion;Dry needling;Energy conservation;Visual/perceptual remediation/compensation    PT Next Visit Plan  nerve glides, muscle tissue length, posture    PT Home Exercise Plan  give next session.    Consulted and Agree with Plan of Care  Patient       Patient will benefit from skilled therapeutic intervention in order to improve the following  deficits and impairments:  Decreased activity tolerance, Decreased safety awareness, Decreased range of motion, Decreased mobility, Decreased strength, Impaired flexibility, Postural dysfunction, Improper body mechanics, Pain  Visit Diagnosis: Radiculopathy, cervical region  Abnormal posture  Cervicalgia  Pain in thoracic spine     Problem List Patient Active Problem List   Diagnosis Date Noted  . Cough 09/08/2017  . Type 1 dissection of ascending aorta ([place]) 04/23/2017  . HPV (human papilloma virus) infection 12/01/2016  . Screening mammogram, encounter for 11/29/2016  . Estrogen deficiency 11/29/2016  . Encounter for routine gynecological examination 11/29/2016  . Grade III hemorrhoids 10/06/2016  .  Tachycardia 08/14/2016  . Hemorrhoids 08/13/2016  . Atypical migraine 08/03/2016  . CAD (coronary artery disease)   . AAA (abdominal aortic aneurysm) ([place])   . Chronic combined systolic (congestive) and diastolic (congestive) heart failure ([place])   . Hyperglycemia 02/11/2016  . Generalized anxiety disorder 12/08/2015  . Panic attacks 07/28/2015  . Sensorineural hearing loss 03/27/2013  . Vitamin D deficiency 07/03/2012  . H/O aortic dissection 05/12/2012  . Obesity (BMI 30.0-34.9) 11/21/2011  . Routine general medical examination at a health care facility 09/20/2011  . S/P AVR (aortic valve replacement) 01/07/2011  . CORONARY ARTERY BYPASS GRAFT, HX OF 01/07/2011  . Arthritis 12/20/2010  . H/O dissecting abdominal aortic aneurysm repair 08/24/2010  . IRRITABLE BOWEL SYNDROME 09/09/2009  . Obstructive sleep apnea 08/04/2009  . External hemorrhoid 06/26/2009  . Osteoporosis 01/09/2009  . Hyperlipidemia 07/26/2007  . Depression with anxiety 07/26/2007  . Essential hypertension 07/26/2007  . Allergic rhinitis 07/26/2007  . Asthma 07/26/2007  . GERD 07/26/2007  . Osteoarthritis 07/26/2007  . Ocular migraine 07/26/2007  . Mild intermittent asthma without complication 73/54/3014   Janna Arch, PT, DPT   06/23/2018, 9:25 AM  [place] MAIN [facility] SERVICES [address] [place], [place], [address] Phone: [phone]   Fax:  [phone]  Name: Rachel Torres MRN: [id number] Date of Birth: 1956-06-16

## 2018-06-23 NOTE — Patient Instructions (Signed)

## 2018-06-26 ENCOUNTER — Other Ambulatory Visit (INDEPENDENT_AMBULATORY_CARE_PROVIDER_SITE_OTHER): Payer: Medicare HMO

## 2018-06-26 ENCOUNTER — Ambulatory Visit (INDEPENDENT_AMBULATORY_CARE_PROVIDER_SITE_OTHER): Payer: Medicare HMO | Admitting: Psychology

## 2018-06-26 DIAGNOSIS — R739 Hyperglycemia, unspecified: Secondary | ICD-10-CM | POA: Diagnosis not present

## 2018-06-26 DIAGNOSIS — F4323 Adjustment disorder with mixed anxiety and depressed mood: Secondary | ICD-10-CM | POA: Diagnosis not present

## 2018-06-26 DIAGNOSIS — E559 Vitamin D deficiency, unspecified: Secondary | ICD-10-CM

## 2018-06-26 DIAGNOSIS — E7800 Pure hypercholesterolemia, unspecified: Secondary | ICD-10-CM

## 2018-06-26 DIAGNOSIS — I10 Essential (primary) hypertension: Secondary | ICD-10-CM

## 2018-06-26 LAB — LIPID PANEL
Cholesterol: 166 mg/dL (ref 0–200)
HDL: 56.7 mg/dL (ref >39.00)
LDL Cholesterol: 87 mg/dL (ref 0–99)
NonHDL: 108.85
Total CHOL/HDL Ratio: 3
Triglycerides: 111 mg/dL (ref 0.0–149.0)
VLDL: 22.2 mg/dL (ref 0.0–40.0)

## 2018-06-26 LAB — COMPREHENSIVE METABOLIC PANEL
ALT: 19 U/L (ref 0–35)
AST: 18 U/L (ref 0–37)
Albumin: 4 g/dL (ref 3.5–5.2)
Alkaline Phosphatase: 58 U/L (ref 39–117)
BUN: 17 mg/dL (ref 6–23)
CO2: 28 mEq/L (ref 19–32)
Calcium: 9 mg/dL (ref 8.4–10.5)
Chloride: 105 mEq/L (ref 96–112)
Creatinine, Ser: 0.83 mg/dL (ref 0.40–1.20)
GFR: 74.36 mL/min (ref >60.00)
Glucose, Bld: 112 mg/dL — ABNORMAL HIGH (ref 70–99)
Potassium: 4.2 mEq/L (ref 3.5–5.1)
Sodium: 140 mEq/L (ref 135–145)
Total Bilirubin: 0.5 mg/dL (ref 0.2–1.2)
Total Protein: 6.9 g/dL (ref 6.0–8.3)

## 2018-06-26 LAB — VITAMIN D 25 HYDROXY (VIT D DEFICIENCY, FRACTURES): VITD: 73.13 ng/mL (ref 30.00–100.00)

## 2018-06-26 LAB — HEMOGLOBIN A1C: Hgb A1c MFr Bld: 6.1 % (ref 4.6–6.5)

## 2018-06-26 MED ORDER — BUSPIRONE HCL 15 MG PO TABS: 7.5000 mg | ORAL_TABLET | Freq: Two times a day (BID) | ORAL | 11 refills | Status: DC

## 2018-06-26 NOTE — Telephone Encounter (Signed)
[place]

## 2018-06-27 ENCOUNTER — Ambulatory Visit: Payer: Medicare HMO

## 2018-06-27 DIAGNOSIS — M546 Pain in thoracic spine: Secondary | ICD-10-CM

## 2018-06-27 DIAGNOSIS — M5412 Radiculopathy, cervical region: Secondary | ICD-10-CM | POA: Diagnosis not present

## 2018-06-27 DIAGNOSIS — M542 Cervicalgia: Secondary | ICD-10-CM

## 2018-06-27 DIAGNOSIS — R293 Abnormal posture: Secondary | ICD-10-CM | POA: Diagnosis not present

## 2018-06-27 NOTE — Progress Notes (Signed)
Cardiology Office Note  Date:  06/29/2018   ID:  Rachel Torres, DOB 28-Jul-1956, MRN [id number]  PCP:  [name], MD   Chief Complaint  Patient presents with  . other    Pt. c/o shortness of breath. Meds reviewed by the pt. verbally.     HPI:  Rachel Torres is a very pleasant 62 year-old woman who suffered an aorta type A dissection in September 2011   rushed to [facility] and underwent aortic valve replacement,  bypass of the right coronary artery with venous donor site from her right thigh,  aortic grafting for a extensive dissection of the  ascending aorta,  residual descending aortic dissection extending into the iliac artery,  chronic back pain,  symptoms of shortness of breath,  abdominal pain,  hypertension  She reports pathology of her aortic valve showed bicuspid valve Chest pain episodes resolved by GI cocktail She presents today for follow-up of her aortic valve replacement and CABG.  Sweating, while sitting weight down 5 pounds, weight still very high SOB with walking, eats out frequently with husband Does not like to cook supper meals for her and him Stress with husband,  Doing PT on back Denies any chest pain  She reports having significant stress at home, takes care of her husband who has Parkinson's Eating "bad" Total chol 132 up to 190s, now down to 166 no regular exercise program  HBA1C 6.1   going to [place], the husband in respite 3 days Going to [place] for 2 weeks September  No recent trips to the emergency room apart from February 2019  emergency room for palpitations, seen January 21, 2018  Told that she had atrial fibrillation based off EKG x2 Started on warfarin She did not start the medication Reports that symptoms went away without intervention  ----Review of EKG from the emergency room January 21, 2018 shows normal sinus rhythm with APCs.  No evidence of atrial fibrillation   Denies having any further episodes of  palpitations No chest pain symptoms  echocardiogram 01/2018  normal with normal ejection fraction no significant valve disease Unable to visualize aorta  EKG personally reviewed by myself on todays visit Normal sinus rhythm rate 75 bpm no significant ST or T wave changes  Other past medical history reviewed Previous episode of Chest pain December 11, 2017, Was driving, pulled over, called EMT Resolved 30 min later, 8/10 in intensity EKG was ok Typically takes GI cocktail when she has the symptoms.   Reports that she had significant stress with dinner party planned the next day.   takes GI cocktail for severe symptoms Once a month or so has severe reflux symptoms   seen by vascular surgery , Dr. Donnetta Hutching, seen 01/2017  recommendation was for f/u in 2 years with CT scan of her chest abdomen and pelvis.  Echo 01/2016, normal EF >55%    Previous  30 day monitor ordered for palpitations,  APCs and PVCs  Other past medical history reviewed Echocardiogram May 2016 shows slightly improved cardiac function, ejection fraction 40-45%.   diagnosed with obstructive sleep apnea. She has a CPAP machine but she is not using it.  Previous evaluations in the emergency room for chest pain. Cardiac enzymes were negative , EKG unchanged .She had a CT scan dated 06/20/2013. She was transferred to [facility] given her complicated anatomy. images were evaluated by vascular surgery it was determined there have been no significant progression of her disease  Previous evaluation at [facility] for  shortness of breath and chest discomfort. She had workup for cardiac issues including echocardiogram and PET stress, CTA of the chest showing stable descending aorta repair with type B. aortic dissection with opacification of the false lumen at the level of the renal arteries via a lumbar artery.  Echocardiogram showed ejection fraction had improved to 45-50%, mild MR, TR (ejection fraction improved from 30-35% in January  2012)  Previous H/o abdominal pain, further workup showing biliary stones and sludge. s/p  laparoscopic cholecystectomy at [facility].  She takes Lasix for urinary retention.    PMH:   has a past medical history of AAA (abdominal aortic aneurysm) ([place]), Allergy, Anemia, Anxiety, Asthma, CAD (coronary artery disease), Chronic combined systolic (congestive) and diastolic (congestive) heart failure ([place]), Depression, Gallbladder sludge, GERD (gastroesophageal reflux disease), History of Bicuspid Aortic Valve, repair of dissecting thoracic aortic aneurysm, Stanford type A, Hyperlipidemia, Hypertensive heart disease, IBS (irritable bowel syndrome), Internal hemorrhoids, Marfan syndrome, Migraine, Obstructive sleep apnea, Osteoporosis, Pneumonia, and RA (rheumatoid arthritis) ([place]).  PSH:    Past Surgical History:  Procedure Laterality Date  . ABDOMINAL AORTIC ANEURYSM REPAIR    . CARDIAC CATHETERIZATION  2001  . [place]  . CHOLECYSTECTOMY    . COLONOSCOPY    . FRACTURE SURGERY Right    shoulder replacement  . HEMORRHOID BANDING    . ORIF DISTAL RADIUS FRACTURE Left   . UPPER GASTROINTESTINAL ENDOSCOPY      Current Outpatient Medications  Medication Sig Dispense Refill  . albuterol (PROVENTIL HFA;VENTOLIN HFA) 108 (90 Base) MCG/ACT inhaler Inhale 1-2 puffs into the lungs every 6 (six) hours as needed for wheezing or shortness of breath. 1 Inhaler 0  . ALPRAZolam (XANAX) 0.5 MG tablet Take 1 tablet (0.5 mg total) by mouth daily as needed for anxiety. 30 tablet 0  . aspirin EC 81 MG tablet Take 81 mg by mouth at bedtime.    Marland Kitchen atorvastatin (LIPITOR) 20 MG tablet TAKE 1 TABLET (20 MG TOTAL) BY MOUTH DAILY. 90 tablet 0  . b complex vitamins tablet Take 1 tablet by mouth daily.    . benzonatate (TESSALON) 200 MG capsule Take 1 capsule (200 mg total) by mouth 3 (three) times daily as needed. 30 capsule 0  . BIOTIN PO Take 1 capsule by mouth daily.    . busPIRone (BUSPAR) 15 MG  tablet Take 0.5 tablets (7.5 mg total) by mouth 2 (two) times daily. 30 tablet 11  . carvedilol (COREG) 6.25 MG tablet Take 1 tablet (6.25 mg total) by mouth 2 (two) times daily with a meal. 180 tablet 3  . Cholecalciferol (VITAMIN D3) 5000 units CAPS Take 1 capsule by mouth daily.    . cyclobenzaprine (FLEXERIL) 10 MG tablet Take 0.5-1 tablets (5-10 mg total) by mouth 3 (three) times daily as needed. For headache 90 tablet 3  . furosemide (LASIX) 20 MG tablet TAKE 1 TABLET EVERY DAY 90 tablet 3  . gabapentin (NEURONTIN) 100 MG capsule Take 3 capsules (300 mg total) by mouth at bedtime. 90 capsule 5  . hydrocortisone 2.5 % cream Apply topically 2 (two) times daily. For maximum 7 days. 28 g 0  . Krill Oil (OMEGA-3) 500 MG CAPS Take 1 capsule by mouth daily.    Marland Kitchen lisinopril (PRINIVIL,ZESTRIL) 5 MG tablet TAKE 1/2 TABLET (2.5 MG TOTAL) BY MOUTH DAILY. 45 tablet 3  . magnesium gluconate (MAGONATE) 500 MG tablet Take 500 mg by mouth at bedtime.    . Misc Natural Products (  TART CHERRY ADVANCED) CAPS Take 1 capsule by mouth daily.    . naproxen sodium (ANAPROX) 220 MG tablet Take 440 mg by mouth 2 (two) times daily as needed (for headaches/pain).    . nitroGLYCERIN (NITROSTAT) 0.4 MG SL tablet Place 1 tablet (0.4 mg total) under the tongue every 5 (five) minutes as needed for chest pain. 25 tablet 3  . omeprazole (PRILOSEC) 40 MG capsule Take 1 capsule (40 mg total) by mouth daily. 90 capsule 3  . potassium chloride (K-DUR,KLOR-CON) 10 MEQ tablet Take 1 tablet (10 mEq total) by mouth 2 (two) times daily as needed. 180 tablet 3  . promethazine (PHENERGAN) 25 MG tablet Take 0.5 tablets (12.5 mg total) by mouth every 8 (eight) hours as needed for nausea. 90 tablet 3  . venlafaxine XR (EFFEXOR-XR) 150 MG 24 hr capsule TAKE 1 CAPSULE EVERY DAY WITH BREAKFAST 90 capsule 2   No current facility-administered medications for this visit.      Allergies:   Fosamax [alendronate sodium]; Ginzing [ginseng];  Hydrocod polst-cpm polst er; Hydrocodone-acetaminophen; Imitrex [sumatriptan]; Oxycodone; Peanut-containing drug products; Tramadol; Hydrocodone-guaifenesin; Prednisone; and Tape   Social History:  The patient  reports that she quit smoking about 39 years ago. She has never used smokeless tobacco. She reports that she does not drink alcohol or use drugs.   Family History:   family history includes Aneurysm in her paternal grandmother; Arthritis in her paternal grandfather, paternal grandmother, sister, and sister; Asthma in her brother, mother, sister, and sister; Colon cancer in her maternal grandmother; Depression in her mother; Diabetes in her paternal grandfather, paternal grandmother, and sister; Heart disease in her sister; Hypertension in her mother; Irritable bowel syndrome in her mother; Lung cancer in her maternal grandfather; Lymphoma in her maternal grandmother; Melanoma in her maternal grandfather; Migraines in her brother, maternal grandmother, sister, and sister; Nephrolithiasis in her sister; Osteoporosis in her mother and sister; Other in her brother; Other (age of onset: 7) in her father; Stroke in her maternal grandmother and mother.    Review of Systems: Review of Systems  Constitutional: Positive for diaphoresis.  Respiratory: Positive for shortness of breath.   Cardiovascular: Negative.   Gastrointestinal: Negative.   Musculoskeletal: Negative.   Neurological: Negative.   Psychiatric/Behavioral: Negative.   All other systems reviewed and are negative.    PHYSICAL EXAM: VS:  BP 120/70 (BP Location: Left Arm, Patient Position: Sitting, Cuff Size: Normal)   Pulse 75   Ht 5\' 5"  (1.651 m)   Wt 207 lb (93.9 kg)   BMI 34.45 kg/m  , BMI Body mass index is 34.45 kg/m. Constitutional:  oriented to person, place, and time. No distress.  HENT:  Head: Normocephalic and atraumatic.  Eyes:  no discharge. No scleral icterus.  Neck: Normal range of motion. Neck supple. No JVD  present.  Cardiovascular: Normal rate, regular rhythm, normal heart sounds and intact distal pulses. Exam reveals no gallop and no friction rub. No edema No murmur heard. Pulmonary/Chest: Effort normal and breath sounds normal. No stridor. No respiratory distress.  no wheezes.  no rales.  no tenderness.  Abdominal: Soft.  no distension.  no tenderness.  Musculoskeletal: Normal range of motion.  no  tenderness or deformity.  Neurological:  normal muscle tone. Coordination normal. No atrophy Skin: Skin is warm and dry. No rash noted. not diaphoretic.  Psychiatric:  normal mood and affect. behavior is normal. Thought content normal.   Recent Labs: 11/24/2017: TSH 1.30 01/21/2018: Hemoglobin 13.3; Platelets 259 06/26/2018:  ALT 19; BUN 17; Creatinine, Ser 0.83; Potassium 4.2; Sodium 140    Lipid Panel Lab Results  Component Value Date   CHOL 166 06/26/2018   HDL 56.70 06/26/2018   LDLCALC 87 06/26/2018   TRIG 111.0 06/26/2018      Wt Readings from Last 3 Encounters:  06/29/18 207 lb (93.9 kg)  06/09/18 208 lb 4 oz (94.5 kg)  05/02/18 207 lb 12 oz (94.2 kg)       ASSESSMENT AND PLAN:  Pure hypercholesterolemia She reports dietary noncompliance Stay on Lipitor and work on her weight  Essential hypertension - Plan: EKG 12-Lead Blood pressure is well controlled on today's visit. No changes made to the medications. Stable.   Palpitations, APCs Rare episodes, Not atrial fibrillation.  She does not need warfarin No recent symptoms  CORONARY ARTERY BYPASS GRAFT, HX OF - Plan: EKG 12-Lead Having shortness of breath which I think is from deconditioning and obesity She'll continue to lose weight but if symptoms persist or get worse we will consider stress testing to evaluate coronary grafts  Dissection of thoracoabdominal aorta (HCC) [place]ed [place] surgeryin [place], Dr. Donnetta Hutching   Repeat scan scheduled for 2020  She has been stable since 2011  Coronary artery disease  involving native coronary artery of native heart without angina pectoris - Plan: EKG 12-Lead In general no significant chest pain Shortness breath as detailed above likely from weight gain and deconditioning but will monitor closely  H/O dissecting abdominal aortic aneurysm repair - Plan: EKG 12-Lead  MRI 2020    Total encounter time more than 25 minutes  Greater than 50% was spent in counseling and coordination of care with the patient   Disposition:   F/U  12 months   Orders Placed This Encounter  Procedures  . EKG 12-Lead     Signed, Esmond Plants, M.D., Ph.D. 06/29/2018  [place], [place]

## 2018-06-27 NOTE — Therapy (Signed)
[place] MAIN [facility] SERVICES [address] [place], [place], [address] Phone: [phone]   Fax:  [phone]  Physical Therapy Treatment  Patient Details  Name: Rachel Torres MRN: [id number] Date of Birth: [birth year] Referring Provider: Mallie Mussel pool   Encounter Date: 06/27/2018  PT End of Session - 06/28/18 0821    Visit Number  27    Number of Visits  34    Date for PT Re-Evaluation  07/13/18    PT Start Time  0800    PT Stop Time  0845    PT Time Calculation (min)  45 min    Activity Tolerance  Treatment limited secondary to medical complications (Comment);Patient tolerated treatment well    Behavior During Therapy  [facility] for tasks assessed/performed       Past Medical History:  Diagnosis Date  . AAA (abdominal aortic aneurysm) ([place])    a. Chronic w/o evidence of aneurysmal dil on CTA 01/2016.  Marland Kitchen Allergy   . Anemia   . Anxiety   . Asthma   . CAD (coronary artery disease)    a. 08/2010 s/p CABG x 1 (VG->RCA) @ time of Ao dissection repair; b. 01/2016 Lexiscan MV: mid antsept/apical defect w/ ? peri-infarct ischemia-->likely attenuation-->Med Rx.  . Chronic combined systolic (congestive) and diastolic (congestive) heart failure ([place])    a. 2012 EF 30-35%; b. 04/2015 EF 40-45%; c. 01/2016 Echo: Ef 55-65%, nl AoV bioprosthesis, nl RV, nl PASP.  Marland Kitchen Depression   . Gallbladder sludge   . GERD (gastroesophageal reflux disease)   . History of Bicuspid Aortic Valve    a. 08/2010 s/p AVR @ time of Ao dissection repair; b. 01/2016 Echo: Ef 55-65%, nl AoV bioprosthesis, nl RV, nl PASP.  Marland Kitchen Hx of repair of dissecting thoracic aortic aneurysm, [place] type A    a. 08/2010 s/p repair with AVR and VG->RCA; b. 01/2016 CTA: stable appearance of Asc Thoracic Aortic graft. Opacification of flase lumen of chronic abd Ao dissection w/ retrograde flow through lumbar arteries. No aneurysmal dil of flase lumen.  . Hyperlipidemia   . Hypertensive heart disease   .  IBS (irritable bowel syndrome)   . Internal hemorrhoids   . Marfan syndrome    pt denies  . Migraine   . Obstructive sleep apnea   . Osteoporosis   . Pneumonia   . RA (rheumatoid arthritis) ([place])    a. Followed by Dr. [name]    Past Surgical History:  Procedure Laterality Date  . ABDOMINAL AORTIC ANEURYSM REPAIR    . CARDIAC CATHETERIZATION  2001  . [place]  . CHOLECYSTECTOMY    . COLONOSCOPY    . FRACTURE SURGERY Right    shoulder replacement  . HEMORRHOID BANDING    . ORIF DISTAL RADIUS FRACTURE Left   . UPPER GASTROINTESTINAL ENDOSCOPY      There were no vitals filed for this visit.  Subjective Assessment - 06/27/18 0805    Subjective  Patient reports no adverse effects from dry  needling last session. Thoracic spine feeling tight.     Pertinent History  Patient is a pleasant 62 year old female who presents with cervical neck pain with radiating symptoms to bilateral fingers. 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  Has high stress levels due to caregiver support. Patient has gained 30 lb.  Has AAA and numbness began seven years ago when aneurysm occured. Sleeping with a neck  pillow to relieve pain. Pain has been occurring for years.     Limitations  Sitting;Lifting;Standing;Walking;Writing;House hold activities;Other (comment);Reading    How long can you sit comfortably?  an hour     How long can you stand comfortably?  n/a    How long can you walk comfortably?  n/a    Diagnostic tests  n/a    Patient Stated Goals  decrease pain.     Currently in Pain?  Yes    Pain Score  2     Pain Location  Neck    Pain Orientation  Posterior    Pain Descriptors / Indicators  Aching    Pain Type  Chronic pain    Pain Onset  More than a month ago    Pain Frequency  Constant    Pain Score  3    Pain Location  Thoracic    Pain Orientation  Mid    Pain Descriptors / Indicators  Aching    Pain Type  Chronic pain     Pain Onset  More than a month ago    Pain Frequency  Constant       Thoracic mobilizations T1-T11 CPAs and UPAs  Rib upslip/downslip 10x each rib  Sidebending with overpressure stretch 4x 20 seconds with R more tight than L Rotation l and R with overpressure stretch 4x20 seconds with R more tight than L.   Suboccipital release 4x 20 seconds    Grade I-III CPA and UPAs c2-T1 hypomobile throughout with pain relief upon L C4 ; 4x10 seconds each level Thoracic J sweep against thoracic curvature mobilizations grade I-III 5x10 seconds    Trigger point/soft tissue release with movement to improve soft tissue limited muscle tissue length. 5 minutes   Seated scapular retractions 20x    Pt. response to medical necessity:  Patient will continue to benefit from skilled physical therapy to reduce pain, improve cervical mobility, and allow patient better QOL   Trigger Point Dry Needling (TDN) Education performed with patient regarding potential benefit of TDN. Reviewed precautions and risks with patient. Reviewed special precautions/risks over lung fields which include pneumothorax. Reviewed signs and symptoms of pneumothorax and advised pt to go to ER immediately if these symptoms develop advise them of dry needling treatment. Extensive time spent with pt to ensure full understanding of TDN risks. Pt provided verbal consent to treatment. Lyndel Safe Huprich PT, DPT, GCS performed TDN to bilateral upper trap with two 0.25 x 60 single needle placements on each side with local twitch response (LTR) noted in 3/4 placements. Two 0.30 x 30 single needle placements also utilized in suboccipitals bilaterally with LTR in 3/4 placements. Pistoning technique utilized. Improved pain-free motion following intervention (unbilled). Phillips Grout PT, DPT, GCS                         PT Education - 06/28/18 [phone]    Education provided  Yes    Education Details  exercise technique, manual, dry  needling    Person(s) Educated  Patient    Methods  Explanation;Demonstration;Verbal cues    Comprehension  Verbalized understanding;Returned demonstration       PT Short Term Goals - 06/21/18 1607      PT SHORT TERM GOAL #1   Title  Patient will be independent in home exercise program to improve strength/mobility for better functional independence with ADLs.    Baseline  HEP compliance    Time  2  Period  Weeks    Status  Achieved      PT SHORT TERM GOAL #2   Title  Patient will report a worst pain of 5/10 on VAS in neck to improve tolerance with ADLs and reduced symptoms with activities.     Baseline  3/4: worst pain 8/10; 4/3: 6/10 4/30: 7/10 tension headache 5/30 7/10 when lifting husband from floor; 6/27: 5/10     Time  2    Period  Weeks    Status  Achieved      PT SHORT TERM GOAL #3   Title  Patient will report a worst pain of 5/10 on VAS in thoracic spine to improve tolerance with ADLs and reduced symptoms with activities.     Baseline  7/3: 8/10     Time  2    Period  Weeks    Status  New    Target Date  07/05/18        PT Long Term Goals - 06/21/18 1608      PT LONG TERM GOAL #1   Title  Patient will report a worst pain of 3/10 on VAS in cervical spine to improve tolerance with ADLs and reduced symptoms with activities    Baseline  worst pain 8/10; 4/3: 6/10 4/30: 7/10 tension headache from caregiver. 5/30: 7/10 from lifting husband; 6/27: 5/10     Time  4    Period  Weeks    Status  Partially Met      PT LONG TERM GOAL #2   Title  Patient will have no tingling in bilateral hands for two consecutive weeks to improve quality of life.     Baseline   4/3: had no tingling for about a week, came back this week 3x due to husband in the hospital. 4/30: 2x a week. 5/30: no tingling.     Time  4    Period  Weeks    Status  Achieved      PT LONG TERM GOAL #3   Title  Patient will decrease NDI score to 28% to decrease level of perceived disability to minimal.      Baseline  3/4: 46%; 4/3: 44%: 4/30: 54% 5/30: 40% 6/27: 32%    Time  4    Period  Weeks    Status  Partially Met      PT LONG TERM GOAL #4   Title  Patient will improve cervical rotation ROM to 60 degrees bilaterally to improve functional mobility.     Baseline  3/4: R 39 L 41; 4/3: R 42 , L 46 4/30: supine L55 R 54 ; 5/30: seated L 55 R 56; 6/27: L 60 , R 56    Time  4    Period  Weeks    Status  Partially Met      PT LONG TERM GOAL #5   Title  Patient will have reduced occurances of high levels of pain to improve quality of life    Baseline  initially was everyday now is 2x/week; 6/27: 2x/week     Time  4    Period  Weeks    Status  Partially Met      Additional Long Term Goals   Additional Long Term Goals  Yes      PT LONG TERM GOAL #6   Title  Patient will report a worst pain of 3/10 on VAS in thoracic spine to improve tolerance with ADLs and reduced symptoms with activities.  Baseline  7/3: 8/10     Time  4    Period  Weeks    Status  New    Target Date  07/13/18      PT LONG TERM GOAL #7   Title  Patient will improve thoracic mobility to allow patient to turn in seat when driving to see behind her for increased safety with driving.     Baseline  7/3: unable to perform     Time  4    Period  Weeks    Status  New    Target Date  07/13/18            Plan - 06/28/18 9169    Clinical Impression Statement  Patient had decreased pain with repetition of mobilizations of spine and ribs. Patient continues to progress with functional mobility in pain free range. Dry needling performed with patient demonstrating good response.  Patient will continue to benefit from skilled physical therapy to reduce pain, improve cervical and thoracic mobility, and allow patient better QOL    Rehab Potential  Fair    Clinical Impairments Affecting Rehab Potential  + CAD testing, history of AAA, anemia, anxiety, asthma, CAD, chronic combined ystolic heart failure, GERD, Marfan syndrome,  OA, RA (+) good work Psychologist, forensic, motivated, age     PT Frequency  2x / week    PT Duration  4 weeks    PT Treatment/Interventions  ADLs/Self Care Home Management;Cryotherapy;Traction;Ultrasound;Therapeutic exercise;Therapeutic activities;Neuromuscular re-education;Patient/family education;Manual techniques;Passive range of motion;Dry needling;Energy conservation;Visual/perceptual remediation/compensation    PT Next Visit Plan  nerve glides, muscle tissue length, posture    PT Home Exercise Plan  give next session.    Consulted and Agree with Plan of Care  Patient       Patient will benefit from skilled therapeutic intervention in order to improve the following deficits and impairments:  Decreased activity tolerance, Decreased safety awareness, Decreased range of motion, Decreased mobility, Decreased strength, Impaired flexibility, Postural dysfunction, Improper body mechanics, Pain  Visit Diagnosis: Radiculopathy, cervical region  Abnormal posture  Cervicalgia  Pain in thoracic spine     Problem List Patient Active Problem List   Diagnosis Date Noted  . Cough 09/08/2017  . Type 1 dissection of ascending aorta ([place]) 04/23/2017  . HPV (human papilloma virus) infection 12/01/2016  . Screening mammogram, encounter for 11/29/2016  . Estrogen deficiency 11/29/2016  . Encounter for routine gynecological examination 11/29/2016  . Grade III hemorrhoids 10/06/2016  . Tachycardia 08/14/2016  . Hemorrhoids 08/13/2016  . Atypical migraine 08/03/2016  . CAD (coronary artery disease)   . AAA (abdominal aortic aneurysm) ([place])   . Chronic combined systolic (congestive) and diastolic (congestive) heart failure ([place])   . Hyperglycemia 02/11/2016  . Generalized anxiety disorder 12/08/2015  . Panic attacks 07/28/2015  . Sensorineural hearing loss 03/27/2013  . Vitamin D deficiency 07/03/2012  . H/O aortic dissection 05/12/2012  . Obesity (BMI 30.0-34.9) 11/21/2011  . Routine general medical  examination at a health care facility 09/20/2011  . S/P AVR (aortic valve replacement) 01/07/2011  . CORONARY ARTERY BYPASS GRAFT, HX OF 01/07/2011  . Arthritis 12/20/2010  . H/O dissecting abdominal aortic aneurysm repair 08/24/2010  . IRRITABLE BOWEL SYNDROME 09/09/2009  . Obstructive sleep apnea 08/04/2009  . External hemorrhoid 06/26/2009  . Osteoporosis 01/09/2009  . Hyperlipidemia 07/26/2007  . Depression with anxiety 07/26/2007  . Essential hypertension 07/26/2007  . Allergic rhinitis 07/26/2007  . Asthma 07/26/2007  . GERD 07/26/2007  . Osteoarthritis 07/26/2007  .  Ocular migraine 07/26/2007  . Mild intermittent asthma without complication 59/96/8957   Janna Arch, PT, DPT   06/28/2018, 8:24 AM  [place] MAIN [facility] SERVICES [address] [place], [place], [address] Phone: [phone]   Fax:  [phone]  Name: JANDI SWIGER MRN: [id number] Date of Birth: [birth year]

## 2018-06-29 ENCOUNTER — Encounter: Payer: Self-pay | Admitting: Cardiovascular Disease

## 2018-06-29 ENCOUNTER — Ambulatory Visit (INDEPENDENT_AMBULATORY_CARE_PROVIDER_SITE_OTHER): Payer: Medicare HMO | Admitting: Cardiovascular Disease

## 2018-06-29 VITALS — BP 120/70 | HR 75 | Ht 65.0 in | Wt 207.0 lb

## 2018-06-29 DIAGNOSIS — I7101 Dissection of ascending aorta: Secondary | ICD-10-CM

## 2018-06-29 DIAGNOSIS — I7103 Dissection of thoracoabdominal aorta: Secondary | ICD-10-CM | POA: Diagnosis not present

## 2018-06-29 DIAGNOSIS — Z952 Presence of prosthetic heart valve: Secondary | ICD-10-CM

## 2018-06-29 DIAGNOSIS — I5042 Chronic combined systolic (congestive) and diastolic (congestive) heart failure: Secondary | ICD-10-CM

## 2018-06-29 DIAGNOSIS — I2510 Atherosclerotic heart disease of native coronary artery without angina pectoris: Secondary | ICD-10-CM | POA: Diagnosis not present

## 2018-06-29 DIAGNOSIS — I10 Essential (primary) hypertension: Secondary | ICD-10-CM

## 2018-06-29 DIAGNOSIS — Z951 Presence of aortocoronary bypass graft: Secondary | ICD-10-CM | POA: Diagnosis not present

## 2018-06-29 DIAGNOSIS — I480 Paroxysmal atrial fibrillation: Secondary | ICD-10-CM | POA: Diagnosis not present

## 2018-06-29 DIAGNOSIS — E7800 Pure hypercholesterolemia, unspecified: Secondary | ICD-10-CM | POA: Diagnosis not present

## 2018-06-29 NOTE — Patient Instructions (Signed)
Medication Instructions:   No medication changes made  Labwork:  No new labs needed  Testing/Procedures:  No further testing at this time   Follow-Up: It was a pleasure seeing you in the office today. Please call us if you have new issues that need to be addressed before your next appt.  [phone]  Your physician wants you to follow-up in: 12 months.  You will receive a reminder letter in the mail two months in advance. If you don't receive a letter, please call our office to schedule the follow-up appointment.  If you need a refill on your cardiac medications before your next appointment, please call your pharmacy.  For educational health videos Log in to : [email or web address] Or : [email or web address], password : [email or web address]   

## 2018-07-03 ENCOUNTER — Ambulatory Visit (INDEPENDENT_AMBULATORY_CARE_PROVIDER_SITE_OTHER): Payer: Medicare HMO | Admitting: Psychology

## 2018-07-03 DIAGNOSIS — F4323 Adjustment disorder with mixed anxiety and depressed mood: Secondary | ICD-10-CM | POA: Diagnosis not present

## 2018-07-07 ENCOUNTER — Ambulatory Visit: Payer: Medicare HMO

## 2018-07-10 ENCOUNTER — Ambulatory Visit (INDEPENDENT_AMBULATORY_CARE_PROVIDER_SITE_OTHER): Payer: Medicare HMO | Admitting: Psychology

## 2018-07-10 DIAGNOSIS — F4323 Adjustment disorder with mixed anxiety and depressed mood: Secondary | ICD-10-CM | POA: Diagnosis not present

## 2018-07-11 ENCOUNTER — Ambulatory Visit: Payer: Medicare HMO

## 2018-07-11 DIAGNOSIS — M542 Cervicalgia: Secondary | ICD-10-CM | POA: Diagnosis not present

## 2018-07-11 DIAGNOSIS — R293 Abnormal posture: Secondary | ICD-10-CM

## 2018-07-11 DIAGNOSIS — M5412 Radiculopathy, cervical region: Secondary | ICD-10-CM

## 2018-07-11 DIAGNOSIS — M546 Pain in thoracic spine: Secondary | ICD-10-CM | POA: Diagnosis not present

## 2018-07-11 NOTE — Therapy (Signed)
[place] MAIN [facility] SERVICES [address] [place], [place], [address] Phone: [phone]   Fax:  [phone]  Physical Therapy Treatment  Patient Details  Name: Rachel Torres MRN: [id number] Date of Birth: May 03, 1956 Referring Provider: Mallie Mussel pool   Encounter Date: 07/11/2018  PT End of Session - 07/11/18 1233    Visit Number  28    Number of Visits  34    Date for PT Re-Evaluation  07/13/18    PT Start Time  7782    PT Stop Time  1100    PT Time Calculation (min)  45 min    Activity Tolerance  Treatment limited secondary to medical complications (Comment);Patient tolerated treatment well    Behavior During Therapy  [facility] for tasks assessed/performed       Past Medical History:  Diagnosis Date  . AAA (abdominal aortic aneurysm) ([place])    a. Chronic w/o evidence of aneurysmal dil on CTA 01/2016.  Marland Kitchen Allergy   . Anemia   . Anxiety   . Asthma   . CAD (coronary artery disease)    a. 08/2010 s/p CABG x 1 (VG->RCA) @ time of Ao dissection repair; b. 01/2016 Lexiscan MV: mid antsept/apical defect w/ ? peri-infarct ischemia-->likely attenuation-->Med Rx.  . Chronic combined systolic (congestive) and diastolic (congestive) heart failure ([place])    a. 2012 EF 30-35%; b. 04/2015 EF 40-45%; c. 01/2016 Echo: Ef 55-65%, nl AoV bioprosthesis, nl RV, nl PASP.  Marland Kitchen Depression   . Gallbladder sludge   . GERD (gastroesophageal reflux disease)   . History of Bicuspid Aortic Valve    a. 08/2010 s/p AVR @ time of Ao dissection repair; b. 01/2016 Echo: Ef 55-65%, nl AoV bioprosthesis, nl RV, nl PASP.  Marland Kitchen Hx of repair of dissecting thoracic aortic aneurysm, [place] type A    a. 08/2010 s/p repair with AVR and VG->RCA; b. 01/2016 CTA: stable appearance of Asc Thoracic Aortic graft. Opacification of flase lumen of chronic abd Ao dissection w/ retrograde flow through lumbar arteries. No aneurysmal dil of flase lumen.  . Hyperlipidemia   . Hypertensive heart disease   .  IBS (irritable bowel syndrome)   . Internal hemorrhoids   . Marfan syndrome    pt denies  . Migraine   . Obstructive sleep apnea   . Osteoporosis   . Pneumonia   . RA (rheumatoid arthritis) ([place])    a. Followed by Dr. [name]    Past Surgical History:  Procedure Laterality Date  . ABDOMINAL AORTIC ANEURYSM REPAIR    . CARDIAC CATHETERIZATION  2001  . [place]  . CHOLECYSTECTOMY    . COLONOSCOPY    . FRACTURE SURGERY Right    shoulder replacement  . HEMORRHOID BANDING    . ORIF DISTAL RADIUS FRACTURE Left   . UPPER GASTROINTESTINAL ENDOSCOPY      There were no vitals filed for this visit.  Subjective Assessment - 07/11/18 1021    Subjective  Patient returning after trip away. Feels pain in thoracic and cervical area due to stiffness.     Pertinent History  Patient is a pleasant 62 year old female who presents with cervical neck pain with radiating symptoms to bilateral fingers. 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  Has high stress levels due to caregiver support. Patient has gained 30 lb.  Has AAA and numbness began seven years ago when aneurysm occured. Sleeping with a neck  pillow to relieve pain. Pain has been occurring for years.     Limitations  Sitting;Lifting;Standing;Walking;Writing;House hold activities;Other (comment);Reading    How long can you sit comfortably?  an hour     How long can you stand comfortably?  n/a    How long can you walk comfortably?  n/a    Diagnostic tests  n/a    Patient Stated Goals  decrease pain.     Currently in Pain?  Yes    Pain Score  2     Pain Location  Back    Pain Orientation  Upper;Mid    Pain Descriptors / Indicators  Aching    Pain Type  Chronic pain    Pain Onset  More than a month ago    Pain Frequency  Constant     Prone:    Thoracic mobilizations T1-T11 CPAs and UPAs  Rib upslip/downslip 10x each rib   Grade I-III CPA and UPAs c2-T1 hypomobile  throughout with pain relief upon L C4 ; 4x10 seconds each level Thoracic J sweep against thoracic curvature mobilizations grade I-III 5x10 seconds    Supine: Sidebending with overpressure stretch 4x 20 seconds with R more tight than L Rotation l and R with overpressure stretch 4x20 seconds with R more tight than L.    Suboccipital release 4x 20 seconds    Trigger point/soft tissue release with movement to improve soft tissue limited muscle tissue length. 5 minutes   Seated scapular retractions 20x    Pt. response to medical necessity:  Patient will continue to benefit from skilled physical therapy to reduce pain, improve cervical mobility, and allow patient better QOL                         PT Education - 07/11/18 1233    Education provided  Yes    Education Details  manual, posture     Person(s) Educated  Patient    Methods  Explanation;Demonstration;Verbal cues    Comprehension  Verbalized understanding;Returned demonstration       PT Short Term Goals - 06/21/18 1607      PT SHORT TERM GOAL #1   Title  Patient will be independent in home exercise program to improve strength/mobility for better functional independence with ADLs.    Baseline  HEP compliance    Time  2    Period  Weeks    Status  Achieved      PT SHORT TERM GOAL #2   Title  Patient will report a worst pain of 5/10 on VAS in neck to improve tolerance with ADLs and reduced symptoms with activities.     Baseline  3/4: worst pain 8/10; 4/3: 6/10 4/30: 7/10 tension headache 5/30 7/10 when lifting husband from floor; 6/27: 5/10     Time  2    Period  Weeks    Status  Achieved      PT SHORT TERM GOAL #3   Title  Patient will report a worst pain of 5/10 on VAS in thoracic spine to improve tolerance with ADLs and reduced symptoms with activities.     Baseline  7/3: 8/10     Time  2    Period  Weeks    Status  New    Target Date  07/05/18        PT Long Term Goals - 06/21/18 1608       PT LONG TERM GOAL #1   Title  Patient will report a worst pain of 3/10 on VAS in cervical spine to improve tolerance with ADLs and reduced symptoms with activities    Baseline  worst pain 8/10; 4/3: 6/10 4/30: 7/10 tension headache from caregiver. 5/30: 7/10 from lifting husband; 6/27: 5/10     Time  4    Period  Weeks    Status  Partially Met      PT LONG TERM GOAL #2   Title  Patient will have no tingling in bilateral hands for two consecutive weeks to improve quality of life.     Baseline   4/3: had no tingling for about a week, came back this week 3x due to husband in the hospital. 4/30: 2x a week. 5/30: no tingling.     Time  4    Period  Weeks    Status  Achieved      PT LONG TERM GOAL #3   Title  Patient will decrease NDI score to 28% to decrease level of perceived disability to minimal.     Baseline  3/4: 46%; 4/3: 44%: 4/30: 54% 5/30: 40% 6/27: 32%    Time  4    Period  Weeks    Status  Partially Met      PT LONG TERM GOAL #4   Title  Patient will improve cervical rotation ROM to 60 degrees bilaterally to improve functional mobility.     Baseline  3/4: R 39 L 41; 4/3: R 42 , L 46 4/30: supine L55 R 54 ; 5/30: seated L 55 R 56; 6/27: L 60 , R 56    Time  4    Period  Weeks    Status  Partially Met      PT LONG TERM GOAL #5   Title  Patient will have reduced occurances of high levels of pain to improve quality of life    Baseline  initially was everyday now is 2x/week; 6/27: 2x/week     Time  4    Period  Weeks    Status  Partially Met      Additional Long Term Goals   Additional Long Term Goals  Yes      PT LONG TERM GOAL #6   Title  Patient will report a worst pain of 3/10 on VAS in thoracic spine to improve tolerance with ADLs and reduced symptoms with activities.     Baseline  7/3: 8/10     Time  4    Period  Weeks    Status  New    Target Date  07/13/18      PT LONG TERM GOAL #7   Title  Patient will improve thoracic mobility to allow patient to turn in  seat when driving to see behind her for increased safety with driving.     Baseline  7/3: unable to perform     Time  4    Period  Weeks    Status  New    Target Date  07/13/18            Plan - 07/11/18 1237    Clinical Impression Statement  Patient has increased muscle tension and decreased tissue length that improved after manual technique. Patient's thoracic spine demonstrated tight paraspinals resulting in hypomobility of thoracic spine and additional adhesions noted to rib attachments. Patient will continue to benefit from skilled physical therapy to reduce pain, improve cervical mobility, and allow patient better QOL    Rehab Potential  Fair    Clinical Impairments Affecting Rehab Potential  + CAD testing, history of AAA, anemia, anxiety, asthma, CAD, chronic combined ystolic heart failure, GERD, Marfan syndrome, OA, RA (+) good work Psychologist, forensic, motivated, age     PT Frequency  2x / week    PT Duration  4 weeks    PT Treatment/Interventions  ADLs/Self Care Home Management;Cryotherapy;Traction;Ultrasound;Therapeutic exercise;Therapeutic activities;Neuromuscular re-education;Patient/family education;Manual techniques;Passive range of motion;Dry needling;Energy conservation;Visual/perceptual remediation/compensation    PT Next Visit Plan  nerve glides, muscle tissue length, posture    PT Home Exercise Plan  give next session.    Consulted and Agree with Plan of Care  Patient       Patient will benefit from skilled therapeutic intervention in order to improve the following deficits and impairments:  Decreased activity tolerance, Decreased safety awareness, Decreased range of motion, Decreased mobility, Decreased strength, Impaired flexibility, Postural dysfunction, Improper body mechanics, Pain  Visit Diagnosis: Radiculopathy, cervical region  Abnormal posture  Cervicalgia  Pain in thoracic spine     Problem List Patient Active Problem List   Diagnosis Date Noted  . Cough  09/08/2017  . Type 1 dissection of ascending aorta ([place]) 04/23/2017  . HPV (human papilloma virus) infection 12/01/2016  . Screening mammogram, encounter for 11/29/2016  . Estrogen deficiency 11/29/2016  . Encounter for routine gynecological examination 11/29/2016  . Grade III hemorrhoids 10/06/2016  . Tachycardia 08/14/2016  . Hemorrhoids 08/13/2016  . Atypical migraine 08/03/2016  . CAD (coronary artery disease)   . AAA (abdominal aortic aneurysm) ([place])   . Chronic combined systolic (congestive) and diastolic (congestive) heart failure ([place])   . Hyperglycemia 02/11/2016  . Generalized anxiety disorder 12/08/2015  . Panic attacks 07/28/2015  . Sensorineural hearing loss 03/27/2013  . Vitamin D deficiency 07/03/2012  . H/O aortic dissection 05/12/2012  . Obesity (BMI 30.0-34.9) 11/21/2011  . Routine general medical examination at a health care facility 09/20/2011  . S/P AVR (aortic valve replacement) 01/07/2011  . CORONARY ARTERY BYPASS GRAFT, HX OF 01/07/2011  . Arthritis 12/20/2010  . H/O dissecting abdominal aortic aneurysm repair 08/24/2010  . IRRITABLE BOWEL SYNDROME 09/09/2009  . Obstructive sleep apnea 08/04/2009  . External hemorrhoid 06/26/2009  . Osteoporosis 01/09/2009  . Hyperlipidemia 07/26/2007  . Depression with anxiety 07/26/2007  . Essential hypertension 07/26/2007  . Allergic rhinitis 07/26/2007  . Asthma 07/26/2007  . GERD 07/26/2007  . Osteoarthritis 07/26/2007  . Ocular migraine 07/26/2007  . Mild intermittent asthma without complication 08/65/7846   Janna Arch, PT, DPT   07/11/2018, 12:40 PM  [place] MAIN [facility] SERVICES [address] [place], [place], [address] Phone: [phone]   Fax:  [phone]  Name: Rachel Torres MRN: [id number] Date of Birth: [birth year]

## 2018-07-14 ENCOUNTER — Ambulatory Visit: Payer: Medicare HMO

## 2018-07-14 DIAGNOSIS — M542 Cervicalgia: Secondary | ICD-10-CM | POA: Diagnosis not present

## 2018-07-14 DIAGNOSIS — M5412 Radiculopathy, cervical region: Secondary | ICD-10-CM | POA: Diagnosis not present

## 2018-07-14 DIAGNOSIS — M546 Pain in thoracic spine: Secondary | ICD-10-CM

## 2018-07-14 DIAGNOSIS — R293 Abnormal posture: Secondary | ICD-10-CM

## 2018-07-14 NOTE — Therapy (Signed)
[place] MAIN [facility] SERVICES [address] [place], [place], [address] Phone: [phone]   Fax:  [phone]  Physical Therapy Treatment Physical Therapy Progress Note   Dates of reporting period  06/21/18  to   07/14/18  Patient Details  Name: KOLLEEN OCHSNER MRN: [id number] Date of Birth: December 01, 1956 Referring Provider: Mallie Mussel pool   Encounter Date: 07/14/2018  PT End of Session - 07/14/18 0947    Visit Number  29    Number of Visits  37    Date for PT Re-Evaluation  08/11/18    PT Start Time  0932    PT Stop Time  1015    PT Time Calculation (min)  43 min    Activity Tolerance  Treatment limited secondary to medical complications (Comment);Patient tolerated treatment well    Behavior During Therapy  [facility] for tasks assessed/performed       Past Medical History:  Diagnosis Date  . AAA (abdominal aortic aneurysm) ([place])    a. Chronic w/o evidence of aneurysmal dil on CTA 01/2016.  Marland Kitchen Allergy   . Anemia   . Anxiety   . Asthma   . CAD (coronary artery disease)    a. 08/2010 s/p CABG x 1 (VG->RCA) @ time of Ao dissection repair; b. 01/2016 Lexiscan MV: mid antsept/apical defect w/ ? peri-infarct ischemia-->likely attenuation-->Med Rx.  . Chronic combined systolic (congestive) and diastolic (congestive) heart failure ([place])    a. 2012 EF 30-35%; b. 04/2015 EF 40-45%; c. 01/2016 Echo: Ef 55-65%, nl AoV bioprosthesis, nl RV, nl PASP.  Marland Kitchen Depression   . Gallbladder sludge   . GERD (gastroesophageal reflux disease)   . History of Bicuspid Aortic Valve    a. 08/2010 s/p AVR @ time of Ao dissection repair; b. 01/2016 Echo: Ef 55-65%, nl AoV bioprosthesis, nl RV, nl PASP.  Marland Kitchen Hx of repair of dissecting thoracic aortic aneurysm, [place] type A    a. 08/2010 s/p repair with AVR and VG->RCA; b. 01/2016 CTA: stable appearance of Asc Thoracic Aortic graft. Opacification of flase lumen of chronic abd Ao dissection w/ retrograde flow through lumbar arteries. No  aneurysmal dil of flase lumen.  . Hyperlipidemia   . Hypertensive heart disease   . IBS (irritable bowel syndrome)   . Internal hemorrhoids   . Marfan syndrome    pt denies  . Migraine   . Obstructive sleep apnea   . Osteoporosis   . Pneumonia   . RA (rheumatoid arthritis) ([place])    a. Followed by Dr. [name]    Past Surgical History:  Procedure Laterality Date  . ABDOMINAL AORTIC ANEURYSM REPAIR    . CARDIAC CATHETERIZATION  2001  . [place]  . CHOLECYSTECTOMY    . COLONOSCOPY    . FRACTURE SURGERY Right    shoulder replacement  . HEMORRHOID BANDING    . ORIF DISTAL RADIUS FRACTURE Left   . UPPER GASTROINTESTINAL ENDOSCOPY      There were no vitals filed for this visit.  Subjective Assessment - 07/14/18 0946    Subjective  Patient presents with increased stress levels in personal life resulting in stiffness and pain. Has been compliant with HEP which helps with pain levels and frequency of pain.     Pertinent History  Patient is a pleasant 62 year old female who presents with cervical neck pain with radiating symptoms to bilateral fingers. 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  Has high stress levels due to caregiver support. Patient has gained 30 lb.  Has AAA and numbness began seven years ago when aneurysm occured. Sleeping with a neck pillow to relieve pain. Pain has been occurring for years.     Limitations  Sitting;Lifting;Standing;Walking;Writing;House hold activities;Other (comment);Reading    How long can you sit comfortably?  an hour     How long can you stand comfortably?  n/a    How long can you walk comfortably?  n/a    Diagnostic tests  n/a    Patient Stated Goals  decrease pain.     Currently in Pain?  Yes    Pain Score  2     Pain Location  Back    Pain Orientation  Upper;Mid    Pain Descriptors / Indicators  Aching    Pain Type  Chronic pain    Pain Onset  More than a month ago    Pain  Frequency  Constant      Goal review:  Cervical VAS: 6/10 Thoracic: 6/10 Pain 2x/week  ROM>60 bilaterally after stretching,  NDI: 40% MODI: 54%   Prone:    Thoracic mobilizations T1-T11 CPAs and UPAs  Rib upslip/downslip 10x each rib   Grade I-III CPA and UPAs c2-T1 hypomobile throughout with pain relief upon L C4 ; 4x10 seconds each level Thoracic J sweep against thoracic curvature mobilizations grade I-III 5x10 seconds      Supine: Sidebending with overpressure stretch 2x 20 seconds with R more tight than L Rotation l and R with overpressure stretch 2x20 seconds with R more tight than L.    Suboccipital release 2x 20 seconds    Trigger point/soft tissue release with movement to improve soft tissue limited muscle tissue length. 3 minutes       Pt. response to medical necessity:  Patient will continue to benefit from skilled physical therapy to reduce pain, improve cervical mobility, and allow patient better QOL         Patient's condition has the potential to improve in response to therapy. Maximum improvement is yet to be obtained. The anticipated improvement is attainable and reasonable in a generally predictable time. Start date of reporting period 06/21/18 end date of reporting period 07/14/18. Patient reports that her pain is less frequent and less intense since starting therapy. Is getting better at turning and vision during driving               PT Education - 07/14/18 0947    Education provided  Yes    Education Details  goal progression, manual, POC    Person(s) Educated  Patient    Methods  Explanation;Demonstration;Verbal cues    Comprehension  Verbalized understanding;Returned demonstration       PT Short Term Goals - 07/14/18 0945      PT SHORT TERM GOAL #1   Title  Patient will be independent in home exercise program to improve strength/mobility for better functional independence with ADLs.    Baseline  HEP compliance    Time  2     Period  Weeks    Status  Achieved      PT SHORT TERM GOAL #2   Title  Patient will report a worst pain of 5/10 on VAS in neck to improve tolerance with ADLs and reduced symptoms with activities.     Baseline  3/4: worst pain 8/10; 4/3: 6/10 4/30: 7/10 tension headache 5/30 7/10 when lifting husband from floor; 6/27: 5/10  Time  2    Period  Weeks    Status  Achieved      PT SHORT TERM GOAL #3   Title  Patient will report a worst pain of 5/10 on VAS in thoracic spine to improve tolerance with ADLs and reduced symptoms with activities.     Baseline  7/3: 8/10 7/26: 6/10     Time  2    Period  Weeks    Status  Partially Met    Target Date  07/28/18        PT Long Term Goals - 07/14/18 0940      PT LONG TERM GOAL #1   Title  Patient will report a worst pain of 3/10 on VAS in cervical spine to improve tolerance with ADLs and reduced symptoms with activities    Baseline  worst pain 8/10; 4/3: 6/10 4/30: 7/10 tension headache from caregiver. 5/30: 7/10 from lifting husband; 6/27: 5/10; 7/26: 6/10 from stress    Time  4    Period  Weeks    Status  Partially Met    Target Date  08/11/18      PT LONG TERM GOAL #2   Title  Patient will have no tingling in bilateral hands for two consecutive weeks to improve quality of life.     Baseline   4/3: had no tingling for about a week, came back this week 3x due to husband in the hospital. 4/30: 2x a week. 5/30: no tingling.     Time  4    Period  Weeks    Status  Achieved      PT LONG TERM GOAL #3   Title  Patient will decrease NDI score to 28% to decrease level of perceived disability to minimal.     Baseline  3/4: 46%; 4/3: 44%: 4/30: 54% 5/30: 40% 6/27: 32%    Time  4    Period  Weeks    Status  Partially Met      PT LONG TERM GOAL #4   Title  Patient will improve cervical rotation ROM to 60 degrees bilaterally to improve functional mobility.     Baseline  3/4: R 39 L 41; 4/3: R 42 , L 46 4/30: supine L55 R 54 ; 5/30: seated L 55 R  56; 6/27: L 60 , R 56; 7/26: >60     Time  4    Period  Weeks    Status  Achieved      PT LONG TERM GOAL #5   Title  Patient will have reduced occurances of high levels of pain to improve quality of life    Baseline  initially was everyday now is 2x/week; 6/27: 2x/week ; 7/26: 2x/week     Time  4    Period  Weeks    Status  Partially Met    Target Date  08/14/18      Additional Long Term Goals   Additional Long Term Goals  Yes      PT LONG TERM GOAL #6   Title  Patient will report a worst pain of 3/10 on VAS in thoracic spine to improve tolerance with ADLs and reduced symptoms with activities.     Baseline  7/3: 8/10 7/26: 6/10     Time  4    Period  Weeks    Status  Partially Met    Target Date  08/11/18      PT LONG TERM GOAL #7  Title  Patient will improve thoracic mobility to allow patient to turn in seat when driving to see behind her for increased safety with driving.     Baseline  7/3: unable to perform ; 7/26: able to perform     Time  4    Period  Weeks    Status  Partially Met    Target Date  08/11/18      PT LONG TERM GOAL #8   Title  Patient will reduce modified Oswestry score to <20 as to demonstrate minimal disability with ADLs including improved sleeping tolerance, walking/sitting tolerance etc for better mobility with ADLs.     Baseline  7/26: 54%    Time  4    Period  Weeks    Status  New    Target Date  08/11/18            Plan - 07/14/18 1112    Clinical Impression Statement  Patient presents to physical therapy with improved thoracic pain levels from 8/10 to 6/10. Improved mobility of cervical sine after manual resulted in full ROM. NDI 40%, MODI 54%. Patient muscle tension and decreased tissue length is improving with manual and mobilizations. Repetitive mobilizations results in improved mobility and pain relief in thoracic region. Patient's condition has the potential to improve in response to therapy. Maximum improvement is yet to be obtained.  The anticipated improvement is attainable and reasonable in a generally predictable time. Patient will continue to benefit from skilled physical therapy to reduce pain, improve cervical mobility, and allow patient better QOL    Rehab Potential  Fair    Clinical Impairments Affecting Rehab Potential  + CAD testing, history of AAA, anemia, anxiety, asthma, CAD, chronic combined ystolic heart failure, GERD, Marfan syndrome, OA, RA (+) good work Psychologist, forensic, motivated, age     PT Frequency  2x / week    PT Duration  4 weeks    PT Treatment/Interventions  ADLs/Self Care Home Management;Cryotherapy;Traction;Ultrasound;Therapeutic exercise;Therapeutic activities;Neuromuscular re-education;Patient/family education;Manual techniques;Passive range of motion;Dry needling;Energy conservation;Visual/perceptual remediation/compensation    PT Next Visit Plan  nerve glides, muscle tissue length, posture    PT Home Exercise Plan  give next session.    Consulted and Agree with Plan of Care  Patient       Patient will benefit from skilled therapeutic intervention in order to improve the following deficits and impairments:  Decreased activity tolerance, Decreased safety awareness, Decreased range of motion, Decreased mobility, Decreased strength, Impaired flexibility, Postural dysfunction, Improper body mechanics, Pain  Visit Diagnosis: Radiculopathy, cervical region  Abnormal posture  Cervicalgia  Pain in thoracic spine     Problem List Patient Active Problem List   Diagnosis Date Noted  . Cough 09/08/2017  . Type 1 dissection of ascending aorta ([place]) 04/23/2017  . HPV (human papilloma virus) infection 12/01/2016  . Screening mammogram, encounter for 11/29/2016  . Estrogen deficiency 11/29/2016  . Encounter for routine gynecological examination 11/29/2016  . Grade III hemorrhoids 10/06/2016  . Tachycardia 08/14/2016  . Hemorrhoids 08/13/2016  . Atypical migraine 08/03/2016  . CAD (coronary artery  disease)   . AAA (abdominal aortic aneurysm) ([place])   . Chronic combined systolic (congestive) and diastolic (congestive) heart failure ([place])   . Hyperglycemia 02/11/2016  . Generalized anxiety disorder 12/08/2015  . Panic attacks 07/28/2015  . Sensorineural hearing loss 03/27/2013  . Vitamin D deficiency 07/03/2012  . H/O aortic dissection 05/12/2012  . Obesity (BMI 30.0-34.9) 11/21/2011  . Routine general medical examination at a health  care facility 09/20/2011  . S/P AVR (aortic valve replacement) 01/07/2011  . CORONARY ARTERY BYPASS GRAFT, HX OF 01/07/2011  . Arthritis 12/20/2010  . H/O dissecting abdominal aortic aneurysm repair 08/24/2010  . IRRITABLE BOWEL SYNDROME 09/09/2009  . Obstructive sleep apnea 08/04/2009  . External hemorrhoid 06/26/2009  . Osteoporosis 01/09/2009  . Hyperlipidemia 07/26/2007  . Depression with anxiety 07/26/2007  . Essential hypertension 07/26/2007  . Allergic rhinitis 07/26/2007  . Asthma 07/26/2007  . GERD 07/26/2007  . Osteoarthritis 07/26/2007  . Ocular migraine 07/26/2007  . Mild intermittent asthma without complication 88/89/[id number]   Janna Arch, PT, DPT   07/14/2018, 11:13 AM  [place] MAIN [facility] SERVICES [address] [place], [place], [address] Phone: [phone]   Fax:  [phone]  Name: BRIEANA SHIMMIN MRN: [id number] Date of Birth: 12-Apr-1956

## 2018-07-18 ENCOUNTER — Ambulatory Visit: Payer: Medicare HMO

## 2018-07-18 DIAGNOSIS — M542 Cervicalgia: Secondary | ICD-10-CM | POA: Diagnosis not present

## 2018-07-18 DIAGNOSIS — R293 Abnormal posture: Secondary | ICD-10-CM | POA: Diagnosis not present

## 2018-07-18 DIAGNOSIS — M546 Pain in thoracic spine: Secondary | ICD-10-CM | POA: Diagnosis not present

## 2018-07-18 DIAGNOSIS — M5412 Radiculopathy, cervical region: Secondary | ICD-10-CM | POA: Diagnosis not present

## 2018-07-18 NOTE — Therapy (Signed)
[place] MAIN [facility] SERVICES [address] [place], [place], [address] Phone: [phone]   Fax:  [phone]  Physical Therapy Treatment  Patient Details  Name: Rachel Torres MRN: [id number] Date of Birth: March 01, 1956 Referring Provider: Mallie Mussel pool   Encounter Date: 07/18/2018  PT End of Session - 07/18/18 1200    Visit Number  30    Number of Visits  37    Date for PT Re-Evaluation  08/11/18    PT Start Time  1016    PT Stop Time  1100    PT Time Calculation (min)  44 min    Activity Tolerance  Treatment limited secondary to medical complications (Comment);Patient tolerated treatment well    Behavior During Therapy  [facility] for tasks assessed/performed       Past Medical History:  Diagnosis Date  . AAA (abdominal aortic aneurysm) ([place])    a. Chronic w/o evidence of aneurysmal dil on CTA 01/2016.  Marland Kitchen Allergy   . Anemia   . Anxiety   . Asthma   . CAD (coronary artery disease)    a. 08/2010 s/p CABG x 1 (VG->RCA) @ time of Ao dissection repair; b. 01/2016 Lexiscan MV: mid antsept/apical defect w/ ? peri-infarct ischemia-->likely attenuation-->Med Rx.  . Chronic combined systolic (congestive) and diastolic (congestive) heart failure ([place])    a. 2012 EF 30-35%; b. 04/2015 EF 40-45%; c. 01/2016 Echo: Ef 55-65%, nl AoV bioprosthesis, nl RV, nl PASP.  Marland Kitchen Depression   . Gallbladder sludge   . GERD (gastroesophageal reflux disease)   . History of Bicuspid Aortic Valve    a. 08/2010 s/p AVR @ time of Ao dissection repair; b. 01/2016 Echo: Ef 55-65%, nl AoV bioprosthesis, nl RV, nl PASP.  Marland Kitchen Hx of repair of dissecting thoracic aortic aneurysm, [place] type A    a. 08/2010 s/p repair with AVR and VG->RCA; b. 01/2016 CTA: stable appearance of Asc Thoracic Aortic graft. Opacification of flase lumen of chronic abd Ao dissection w/ retrograde flow through lumbar arteries. No aneurysmal dil of flase lumen.  . Hyperlipidemia   . Hypertensive heart disease   .  IBS (irritable bowel syndrome)   . Internal hemorrhoids   . Marfan syndrome    pt denies  . Migraine   . Obstructive sleep apnea   . Osteoporosis   . Pneumonia   . RA (rheumatoid arthritis) ([place])    a. Followed by Dr. [name]    Past Surgical History:  Procedure Laterality Date  . ABDOMINAL AORTIC ANEURYSM REPAIR    . CARDIAC CATHETERIZATION  2001  . [place]  . CHOLECYSTECTOMY    . COLONOSCOPY    . FRACTURE SURGERY Right    shoulder replacement  . HEMORRHOID BANDING    . ORIF DISTAL RADIUS FRACTURE Left   . UPPER GASTROINTESTINAL ENDOSCOPY      There were no vitals filed for this visit.  Subjective Assessment - 07/18/18 1019    Subjective  Patient had a migraine over the weekend due to having to take son to emergency room due to him falling off stoop. Is stressed with caregiving for multiple people (son, husband, and mother).  Reports decreased neck ROM and muscle tissue length with pain     Pertinent History  Patient is a pleasant 62 year old female who presents with cervical neck pain with radiating symptoms to bilateral fingers. 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  Has high stress levels due to caregiver support. Patient has gained 30 lb.  Has AAA and numbness began seven years ago when aneurysm occured. Sleeping with a neck pillow to relieve pain. Pain has been occurring for years.     Limitations  Sitting;Lifting;Standing;Walking;Writing;House hold activities;Other (comment);Reading    How long can you sit comfortably?  an hour     How long can you stand comfortably?  n/a    How long can you walk comfortably?  n/a    Diagnostic tests  n/a    Patient Stated Goals  decrease pain.     Pain Score  4     Pain Location  Back    Pain Orientation  Upper;Mid    Pain Descriptors / Indicators  Aching;Nagging    Pain Type  Chronic pain    Pain Onset  More than a month ago    Pain Frequency  Constant        Prone:    Thoracic mobilizations T1-T11 CPAs and UPAs  Rib upslip/downslip 10x each rib   Grade I-III CPA and UPAs c2-T1 hypomobile throughout with pain relief upon L C4 ; 4x10 seconds each level Thoracic J sweep against thoracic curvature mobilizations grade I-III 5x10 seconds      Supine: Sidebending with overpressure stretch 4x 20 seconds with R more tight than L Rotation l and R with overpressure stretch 4x20 seconds with R more tight than L.    Suboccipital release 4x 20 seconds    Trigger point/soft tissue release with movement to improve soft tissue limited muscle tissue length. 5 minutes   Seated scapular retractions 20x   seated pectoral stretch with scapular depression and retraction 2x30 seconds each shoulder  Swiss ball forward rollout and return to scapular squeeze 10x   Pt. response to medical necessity:  Patient will continue to benefit from skilled physical therapy to reduce pain, improve cervical mobility, and allow patient better QOL                         PT Education - 07/18/18 1020    Education provided  Yes    Education Details  manual, posture     Person(s) Educated  Patient    Methods  Explanation;Demonstration;Verbal cues    Comprehension  Verbalized understanding;Returned demonstration       PT Short Term Goals - 07/14/18 0945      PT SHORT TERM GOAL #1   Title  Patient will be independent in home exercise program to improve strength/mobility for better functional independence with ADLs.    Baseline  HEP compliance    Time  2    Period  Weeks    Status  Achieved      PT SHORT TERM GOAL #2   Title  Patient will report a worst pain of 5/10 on VAS in neck to improve tolerance with ADLs and reduced symptoms with activities.     Baseline  3/4: worst pain 8/10; 4/3: 6/10 4/30: 7/10 tension headache 5/30 7/10 when lifting husband from floor; 6/27: 5/10     Time  2    Period  Weeks    Status  Achieved      PT SHORT TERM GOAL  #3   Title  Patient will report a worst pain of 5/10 on VAS in thoracic spine to improve tolerance with ADLs and reduced symptoms with activities.     Baseline  7/3: 8/10 7/26: 6/10  Time  2    Period  Weeks    Status  Partially Met    Target Date  07/28/18        PT Long Term Goals - 07/14/18 0940      PT LONG TERM GOAL #1   Title  Patient will report a worst pain of 3/10 on VAS in cervical spine to improve tolerance with ADLs and reduced symptoms with activities    Baseline  worst pain 8/10; 4/3: 6/10 4/30: 7/10 tension headache from caregiver. 5/30: 7/10 from lifting husband; 6/27: 5/10; 7/26: 6/10 from stress    Time  4    Period  Weeks    Status  Partially Met    Target Date  08/11/18      PT LONG TERM GOAL #2   Title  Patient will have no tingling in bilateral hands for two consecutive weeks to improve quality of life.     Baseline   4/3: had no tingling for about a week, came back this week 3x due to husband in the hospital. 4/30: 2x a week. 5/30: no tingling.     Time  4    Period  Weeks    Status  Achieved      PT LONG TERM GOAL #3   Title  Patient will decrease NDI score to 28% to decrease level of perceived disability to minimal.     Baseline  3/4: 46%; 4/3: 44%: 4/30: 54% 5/30: 40% 6/27: 32%    Time  4    Period  Weeks    Status  Partially Met      PT LONG TERM GOAL #4   Title  Patient will improve cervical rotation ROM to 60 degrees bilaterally to improve functional mobility.     Baseline  3/4: R 39 L 41; 4/3: R 42 , L 46 4/30: supine L55 R 54 ; 5/30: seated L 55 R 56; 6/27: L 60 , R 56; 7/26: >60     Time  4    Period  Weeks    Status  Achieved      PT LONG TERM GOAL #5   Title  Patient will have reduced occurances of high levels of pain to improve quality of life    Baseline  initially was everyday now is 2x/week; 6/27: 2x/week ; 7/26: 2x/week     Time  4    Period  Weeks    Status  Partially Met    Target Date  08/14/18      Additional Long Term  Goals   Additional Long Term Goals  Yes      PT LONG TERM GOAL #6   Title  Patient will report a worst pain of 3/10 on VAS in thoracic spine to improve tolerance with ADLs and reduced symptoms with activities.     Baseline  7/3: 8/10 7/26: 6/10     Time  4    Period  Weeks    Status  Partially Met    Target Date  08/11/18      PT LONG TERM GOAL #7   Title  Patient will improve thoracic mobility to allow patient to turn in seat when driving to see behind her for increased safety with driving.     Baseline  7/3: unable to perform ; 7/26: able to perform     Time  4    Period  Weeks    Status  Partially Met    Target Date  08/11/18      PT LONG TERM GOAL #8   Title  Patient will reduce modified Oswestry score to <20 as to demonstrate minimal disability with ADLs including improved sleeping tolerance, walking/sitting tolerance etc for better mobility with ADLs.     Baseline  7/26: 54%    Time  4    Period  Weeks    Status  New    Target Date  08/11/18            Plan - 07/18/18 1203    Clinical Impression Statement  Patient presents with increased muscle tension of upper cervical region and paraspinals. As a result, hypomobile cervical and thoracic spine mobilizations were present. Patient additionally presented with increased rounded shoulders which was relieved with manual stretching.  Patient will continue to benefit from skilled physical therapy to reduce pain, improve cervical mobility, and allow patient better QOL    Rehab Potential  Fair    Clinical Impairments Affecting Rehab Potential  + CAD testing, history of AAA, anemia, anxiety, asthma, CAD, chronic combined ystolic heart failure, GERD, Marfan syndrome, OA, RA (+) good work Psychologist, forensic, motivated, age     PT Frequency  2x / week    PT Duration  4 weeks    PT Treatment/Interventions  ADLs/Self Care Home Management;Cryotherapy;Traction;Ultrasound;Therapeutic exercise;Therapeutic activities;Neuromuscular  re-education;Patient/family education;Manual techniques;Passive range of motion;Dry needling;Energy conservation;Visual/perceptual remediation/compensation    PT Next Visit Plan  nerve glides, muscle tissue length, posture    PT Home Exercise Plan  give next session.    Consulted and Agree with Plan of Care  Patient       Patient will benefit from skilled therapeutic intervention in order to improve the following deficits and impairments:  Decreased activity tolerance, Decreased safety awareness, Decreased range of motion, Decreased mobility, Decreased strength, Impaired flexibility, Postural dysfunction, Improper body mechanics, Pain  Visit Diagnosis: Radiculopathy, cervical region  Abnormal posture  Cervicalgia  Pain in thoracic spine     Problem List Patient Active Problem List   Diagnosis Date Noted  . Cough 09/08/2017  . Type 1 dissection of ascending aorta ([place]) 04/23/2017  . HPV (human papilloma virus) infection 12/01/2016  . Screening mammogram, encounter for 11/29/2016  . Estrogen deficiency 11/29/2016  . Encounter for routine gynecological examination 11/29/2016  . Grade III hemorrhoids 10/06/2016  . Tachycardia 08/14/2016  . Hemorrhoids 08/13/2016  . Atypical migraine 08/03/2016  . CAD (coronary artery disease)   . AAA (abdominal aortic aneurysm) ([place])   . Chronic combined systolic (congestive) and diastolic (congestive) heart failure ([place])   . Hyperglycemia 02/11/2016  . Generalized anxiety disorder 12/08/2015  . Panic attacks 07/28/2015  . Sensorineural hearing loss 03/27/2013  . Vitamin D deficiency 07/03/2012  . H/O aortic dissection 05/12/2012  . Obesity (BMI 30.0-34.9) 11/21/2011  . Routine general medical examination at a health care facility 09/20/2011  . S/P AVR (aortic valve replacement) 01/07/2011  . CORONARY ARTERY BYPASS GRAFT, HX OF 01/07/2011  . Arthritis 12/20/2010  . H/O dissecting abdominal aortic aneurysm repair 08/24/2010  . IRRITABLE  BOWEL SYNDROME 09/09/2009  . Obstructive sleep apnea 08/04/2009  . External hemorrhoid 06/26/2009  . Osteoporosis 01/09/2009  . Hyperlipidemia 07/26/2007  . Depression with anxiety 07/26/2007  . Essential hypertension 07/26/2007  . Allergic rhinitis 07/26/2007  . Asthma 07/26/2007  . GERD 07/26/2007  . Osteoarthritis 07/26/2007  . Ocular migraine 07/26/2007  . Mild intermittent asthma without complication 50/38/8828   Janna Arch, PT, DPT   07/18/2018, 12:03 PM  [place]  [facility] MAIN [facility] SERVICES [address] [place], [place], [address] Phone: [phone]   Fax:  [phone]  Name: Rachel Torres MRN: [id number] Date of Birth: [birth year]

## 2018-07-19 ENCOUNTER — Ambulatory Visit (INDEPENDENT_AMBULATORY_CARE_PROVIDER_SITE_OTHER): Payer: Medicare HMO | Admitting: Psychology

## 2018-07-19 DIAGNOSIS — M5412 Radiculopathy, cervical region: Secondary | ICD-10-CM | POA: Diagnosis not present

## 2018-07-19 DIAGNOSIS — Z6834 Body mass index (BMI) 34.0-34.9, adult: Secondary | ICD-10-CM | POA: Diagnosis not present

## 2018-07-19 DIAGNOSIS — F4323 Adjustment disorder with mixed anxiety and depressed mood: Secondary | ICD-10-CM

## 2018-07-21 ENCOUNTER — Ambulatory Visit: Payer: Medicare HMO | Attending: Neurosurgery

## 2018-07-21 DIAGNOSIS — M5412 Radiculopathy, cervical region: Secondary | ICD-10-CM | POA: Insufficient documentation

## 2018-07-21 DIAGNOSIS — M542 Cervicalgia: Secondary | ICD-10-CM | POA: Insufficient documentation

## 2018-07-21 DIAGNOSIS — M546 Pain in thoracic spine: Secondary | ICD-10-CM | POA: Insufficient documentation

## 2018-07-21 DIAGNOSIS — R293 Abnormal posture: Secondary | ICD-10-CM | POA: Insufficient documentation

## 2018-07-21 NOTE — Therapy (Signed)
[place] MAIN [facility] SERVICES [address] [place], [place], [address] Phone: [phone]   Fax:  [phone]  Physical Therapy Treatment  Patient Details  Name: Rachel Torres MRN: [id number] Date of Birth: Jul 09, 1956 Referring Provider: Mallie Mussel pool   Encounter Date: 07/21/2018  PT End of Session - 07/21/18 0924    Visit Number  31    Number of Visits  37    Date for PT Re-Evaluation  08/11/18    PT Start Time  0929    PT Stop Time  1015    PT Time Calculation (min)  46 min    Activity Tolerance  Treatment limited secondary to medical complications (Comment);Patient tolerated treatment well    Behavior During Therapy  [facility] for tasks assessed/performed       Past Medical History:  Diagnosis Date  . AAA (abdominal aortic aneurysm) ([place])    a. Chronic w/o evidence of aneurysmal dil on CTA 01/2016.  Marland Kitchen Allergy   . Anemia   . Anxiety   . Asthma   . CAD (coronary artery disease)    a. 08/2010 s/p CABG x 1 (VG->RCA) @ time of Ao dissection repair; b. 01/2016 Lexiscan MV: mid antsept/apical defect w/ ? peri-infarct ischemia-->likely attenuation-->Med Rx.  . Chronic combined systolic (congestive) and diastolic (congestive) heart failure ([place])    a. 2012 EF 30-35%; b. 04/2015 EF 40-45%; c. 01/2016 Echo: Ef 55-65%, nl AoV bioprosthesis, nl RV, nl PASP.  Marland Kitchen Depression   . Gallbladder sludge   . GERD (gastroesophageal reflux disease)   . History of Bicuspid Aortic Valve    a. 08/2010 s/p AVR @ time of Ao dissection repair; b. 01/2016 Echo: Ef 55-65%, nl AoV bioprosthesis, nl RV, nl PASP.  Marland Kitchen Hx of repair of dissecting thoracic aortic aneurysm, Stanford type A    a. 08/2010 s/p repair with AVR and VG->RCA; b. 01/2016 CTA: stable appearance of Asc Thoracic Aortic graft. Opacification of flase lumen of chronic abd Ao dissection w/ retrograde flow through lumbar arteries. No aneurysmal dil of flase lumen.  . Hyperlipidemia   . Hypertensive heart disease   .  IBS (irritable bowel syndrome)   . Internal hemorrhoids   . Marfan syndrome    pt denies  . Migraine   . Obstructive sleep apnea   . Osteoporosis   . Pneumonia   . RA (rheumatoid arthritis) ([place])    a. Followed by Dr. [name]    Past Surgical History:  Procedure Laterality Date  . ABDOMINAL AORTIC ANEURYSM REPAIR    . CARDIAC CATHETERIZATION  2001  . [place]  . CHOLECYSTECTOMY    . COLONOSCOPY    . FRACTURE SURGERY Right    shoulder replacement  . HEMORRHOID BANDING    . ORIF DISTAL RADIUS FRACTURE Left   . UPPER GASTROINTESTINAL ENDOSCOPY      There were no vitals filed for this visit.  Subjective Assessment - 07/21/18 0930    Subjective  Patient had large increase in pain since last session due to husband attempting to take ramp off back of car and falling. Is stressed with caregiver. Patient reports icing back reducing pain from 5/10 to now 2/10     Pertinent History  Patient is a pleasant 62 year old female who presents with cervical neck pain with radiating symptoms to bilateral fingers. 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  Has high stress levels due to caregiver  support. Patient has gained 30 lb.  Has AAA and numbness began seven years ago when aneurysm occured. Sleeping with a neck pillow to relieve pain. Pain has been occurring for years.     Limitations  Sitting;Lifting;Standing;Walking;Writing;House hold activities;Other (comment);Reading    How long can you sit comfortably?  an hour     How long can you stand comfortably?  n/a    How long can you walk comfortably?  n/a    Diagnostic tests  n/a    Patient Stated Goals  decrease pain.     Currently in Pain?  Yes    Pain Score  2     Pain Location  Back    Pain Orientation  Mid;Upper    Pain Descriptors / Indicators  Aching;Nagging    Pain Type  Chronic pain    Pain Onset  More than a month ago    Pain Frequency  Constant       Prone:     Thoracic mobilizations T1-T11 CPAs and UPAs  Rib upslip/downslip 10x each rib   Grade I-III CPA and UPAs c2-T1 hypomobile throughout with pain relief upon L C4 ; 4x10 seconds each level Thoracic J sweep against thoracic curvature mobilizations grade I-III 5x10 seconds      Supine:  Open prayer stretch 4x 60 seconds each Directon  Sidebending with overpressure stretch 4x 20 seconds with R more tight than L Rotation l and R with overpressure stretch 4x20 seconds with R more tight than L.    Suboccipital release 4x 20 seconds    Trigger point/soft tissue release with movement to improve soft tissue limited muscle tissue length. 5 minutes   Seated scapular retractions 20x      Pt. response to medical necessity:  Patient will continue to benefit from skilled physical therapy to reduce pain, improve cervical mobility, and allow patient better QOL        Trigger Point Dry Needling (TDN) (unbilled) Education performed with patient regarding potential benefit of TDN. Reviewed precautions and risks with patient. Reviewed special precautions/risks over lung fields which include pneumothorax. Reviewed signs and symptoms of pneumothorax and advised pt to go to ER immediately if these symptoms develop advise them of dry needling treatment. Pt expresses full understanding of TDN risks. Pt provided verbal consent to treatment. TDN performed to thoracic multifidi with 4 (2 on each side), 0.30 x 75 single needle placements with local twitch response (LTR) on L side only with both placements. Needles placed at T5 and T7 bilaterally. Lamina contacted with each placement. Phillips Grout PT, DPT, GCS                     PT Education - 07/21/18 [id number]    Education provided  Yes    Education Details  manual, posture     Person(s) Educated  Patient    Methods  Explanation;Demonstration;Tactile cues    Comprehension  Verbalized understanding;Returned demonstration       PT Short Term  Goals - 07/14/18 0945      PT SHORT TERM GOAL #1   Title  Patient will be independent in home exercise program to improve strength/mobility for better functional independence with ADLs.    Baseline  HEP compliance    Time  2    Period  Weeks    Status  Achieved      PT SHORT TERM GOAL #2   Title  Patient will report a worst pain of 5/10 on VAS  in neck to improve tolerance with ADLs and reduced symptoms with activities.     Baseline  3/4: worst pain 8/10; 4/3: 6/10 4/30: 7/10 tension headache 5/30 7/10 when lifting husband from floor; 6/27: 5/10     Time  2    Period  Weeks    Status  Achieved      PT SHORT TERM GOAL #3   Title  Patient will report a worst pain of 5/10 on VAS in thoracic spine to improve tolerance with ADLs and reduced symptoms with activities.     Baseline  7/3: 8/10 7/26: 6/10     Time  2    Period  Weeks    Status  Partially Met    Target Date  07/28/18        PT Long Term Goals - 07/14/18 0940      PT LONG TERM GOAL #1   Title  Patient will report a worst pain of 3/10 on VAS in cervical spine to improve tolerance with ADLs and reduced symptoms with activities    Baseline  worst pain 8/10; 4/3: 6/10 4/30: 7/10 tension headache from caregiver. 5/30: 7/10 from lifting husband; 6/27: 5/10; 7/26: 6/10 from stress    Time  4    Period  Weeks    Status  Partially Met    Target Date  08/11/18      PT LONG TERM GOAL #2   Title  Patient will have no tingling in bilateral hands for two consecutive weeks to improve quality of life.     Baseline   4/3: had no tingling for about a week, came back this week 3x due to husband in the hospital. 4/30: 2x a week. 5/30: no tingling.     Time  4    Period  Weeks    Status  Achieved      PT LONG TERM GOAL #3   Title  Patient will decrease NDI score to 28% to decrease level of perceived disability to minimal.     Baseline  3/4: 46%; 4/3: 44%: 4/30: 54% 5/30: 40% 6/27: 32%    Time  4    Period  Weeks    Status  Partially  Met      PT LONG TERM GOAL #4   Title  Patient will improve cervical rotation ROM to 60 degrees bilaterally to improve functional mobility.     Baseline  3/4: R 39 L 41; 4/3: R 42 , L 46 4/30: supine L55 R 54 ; 5/30: seated L 55 R 56; 6/27: L 60 , R 56; 7/26: >60     Time  4    Period  Weeks    Status  Achieved      PT LONG TERM GOAL #5   Title  Patient will have reduced occurances of high levels of pain to improve quality of life    Baseline  initially was everyday now is 2x/week; 6/27: 2x/week ; 7/26: 2x/week     Time  4    Period  Weeks    Status  Partially Met    Target Date  08/14/18      Additional Long Term Goals   Additional Long Term Goals  Yes      PT LONG TERM GOAL #6   Title  Patient will report a worst pain of 3/10 on VAS in thoracic spine to improve tolerance with ADLs and reduced symptoms with activities.     Baseline  7/3: 8/10  7/26: 6/10     Time  4    Period  Weeks    Status  Partially Met    Target Date  08/11/18      PT LONG TERM GOAL #7   Title  Patient will improve thoracic mobility to allow patient to turn in seat when driving to see behind her for increased safety with driving.     Baseline  7/3: unable to perform ; 7/26: able to perform     Time  4    Period  Weeks    Status  Partially Met    Target Date  08/11/18      PT LONG TERM GOAL #8   Title  Patient will reduce modified Oswestry score to <20 as to demonstrate minimal disability with ADLs including improved sleeping tolerance, walking/sitting tolerance etc for better mobility with ADLs.     Baseline  7/26: 54%    Time  4    Period  Weeks    Status  New    Target Date  08/11/18            Plan - 07/21/18 1143    Clinical Impression Statement  Patient presents to physical therapy with decreased muscle tissue length and increased muscle guarding resulting in hypomobility of thoracic and cervical region. Patient educated on open book stretch to reduce muscle tension.  Patient will  continue to benefit from skilled physical therapy to reduce pain, improve cervical mobility, and allow patient better QOL    Rehab Potential  Fair    Clinical Impairments Affecting Rehab Potential  + CAD testing, history of AAA, anemia, anxiety, asthma, CAD, chronic combined ystolic heart failure, GERD, Marfan syndrome, OA, RA (+) good work Psychologist, forensic, motivated, age     PT Frequency  2x / week    PT Duration  4 weeks    PT Treatment/Interventions  ADLs/Self Care Home Management;Cryotherapy;Traction;Ultrasound;Therapeutic exercise;Therapeutic activities;Neuromuscular re-education;Patient/family education;Manual techniques;Passive range of motion;Dry needling;Energy conservation;Visual/perceptual remediation/compensation    PT Next Visit Plan  nerve glides, muscle tissue length, posture    PT Home Exercise Plan  give next session.    Consulted and Agree with Plan of Care  Patient       Patient will benefit from skilled therapeutic intervention in order to improve the following deficits and impairments:  Decreased activity tolerance, Decreased safety awareness, Decreased range of motion, Decreased mobility, Decreased strength, Impaired flexibility, Postural dysfunction, Improper body mechanics, Pain  Visit Diagnosis: Radiculopathy, cervical region  Abnormal posture  Cervicalgia  Pain in thoracic spine     Problem List Patient Active Problem List   Diagnosis Date Noted  . Cough 09/08/2017  . Type 1 dissection of ascending aorta ([place]) 04/23/2017  . HPV (human papilloma virus) infection 12/01/2016  . Screening mammogram, encounter for 11/29/2016  . Estrogen deficiency 11/29/2016  . Encounter for routine gynecological examination 11/29/2016  . Grade III hemorrhoids 10/06/2016  . Tachycardia 08/14/2016  . Hemorrhoids 08/13/2016  . Atypical migraine 08/03/2016  . CAD (coronary artery disease)   . AAA (abdominal aortic aneurysm) ([place])   . Chronic combined systolic (congestive) and  diastolic (congestive) heart failure ([place])   . Hyperglycemia 02/11/2016  . Generalized anxiety disorder 12/08/2015  . Panic attacks 07/28/2015  . Sensorineural hearing loss 03/27/2013  . Vitamin D deficiency 07/03/2012  . H/O aortic dissection 05/12/2012  . Obesity (BMI 30.0-34.9) 11/21/2011  . Routine general medical examination at a health care facility 09/20/2011  . S/P AVR (aortic valve replacement)  01/07/2011  . CORONARY ARTERY BYPASS GRAFT, HX OF 01/07/2011  . Arthritis 12/20/2010  . H/O dissecting abdominal aortic aneurysm repair 08/24/2010  . IRRITABLE BOWEL SYNDROME 09/09/2009  . Obstructive sleep apnea 08/04/2009  . External hemorrhoid 06/26/2009  . Osteoporosis 01/09/2009  . Hyperlipidemia 07/26/2007  . Depression with anxiety 07/26/2007  . Essential hypertension 07/26/2007  . Allergic rhinitis 07/26/2007  . Asthma 07/26/2007  . GERD 07/26/2007  . Osteoarthritis 07/26/2007  . Ocular migraine 07/26/2007  . Mild intermittent asthma without complication 01/65/8006   Janna Arch, PT, DPT   07/21/2018, 11:44 AM  [place] MAIN [facility] SERVICES [address] [place], [place], [address] Phone: [phone]   Fax:  [phone]  Name: Rachel Torres MRN: [id number] Date of Birth: 10/07/1956

## 2018-07-24 ENCOUNTER — Ambulatory Visit (INDEPENDENT_AMBULATORY_CARE_PROVIDER_SITE_OTHER): Payer: Medicare HMO | Admitting: Psychology

## 2018-07-24 DIAGNOSIS — F4323 Adjustment disorder with mixed anxiety and depressed mood: Secondary | ICD-10-CM

## 2018-07-28 ENCOUNTER — Ambulatory Visit: Payer: Medicare HMO

## 2018-07-28 DIAGNOSIS — R293 Abnormal posture: Secondary | ICD-10-CM

## 2018-07-28 DIAGNOSIS — M546 Pain in thoracic spine: Secondary | ICD-10-CM

## 2018-07-28 DIAGNOSIS — M5412 Radiculopathy, cervical region: Secondary | ICD-10-CM | POA: Diagnosis not present

## 2018-07-28 DIAGNOSIS — M542 Cervicalgia: Secondary | ICD-10-CM | POA: Diagnosis not present

## 2018-07-28 NOTE — Therapy (Signed)
[place] MAIN [facility] SERVICES [address] [place], [place], [address] Phone: [phone]   Fax:  [phone]  Physical Therapy Treatment  Patient Details  Name: FADUMA CHO MRN: [id number] Date of Birth: June 23, 1956 Referring Provider: Mallie Mussel pool   Encounter Date: 07/28/2018  PT End of Session - 07/28/18 0904    Visit Number  32    Number of Visits  37    Date for PT Re-Evaluation  08/11/18    PT Start Time  0914    PT Stop Time  1000    PT Time Calculation (min)  46 min    Activity Tolerance  Treatment limited secondary to medical complications (Comment);Patient tolerated treatment well    Behavior During Therapy  [name] for tasks assessed/performed       Past Medical History:  Diagnosis Date  . AAA (abdominal aortic aneurysm) ([place])    a. Chronic w/o evidence of aneurysmal dil on CTA 01/2016.  Marland Kitchen Allergy   . Anemia   . Anxiety   . Asthma   . CAD (coronary artery disease)    a. 08/2010 s/p CABG x 1 (VG->RCA) @ time of Ao dissection repair; b. 01/2016 Lexiscan MV: mid antsept/apical defect w/ ? peri-infarct ischemia-->likely attenuation-->Med Rx.  . Chronic combined systolic (congestive) and diastolic (congestive) heart failure ([place])    a. 2012 EF 30-35%; b. 04/2015 EF 40-45%; c. 01/2016 Echo: Ef 55-65%, nl AoV bioprosthesis, nl RV, nl PASP.  Marland Kitchen Depression   . Gallbladder sludge   . GERD (gastroesophageal reflux disease)   . History of Bicuspid Aortic Valve    a. 08/2010 s/p AVR @ time of Ao dissection repair; b. 01/2016 Echo: Ef 55-65%, nl AoV bioprosthesis, nl RV, nl PASP.  Marland Kitchen Hx of repair of dissecting thoracic aortic aneurysm, [place] type A    a. 08/2010 s/p repair with AVR and VG->RCA; b. 01/2016 CTA: stable appearance of Asc Thoracic Aortic graft. Opacification of flase lumen of chronic abd Ao dissection w/ retrograde flow through lumbar arteries. No aneurysmal dil of flase lumen.  . Hyperlipidemia   . Hypertensive heart disease   .  IBS (irritable bowel syndrome)   . Internal hemorrhoids   . Marfan syndrome    pt denies  . Migraine   . Obstructive sleep apnea   . Osteoporosis   . Pneumonia   . RA (rheumatoid arthritis) ([place])    a. Followed by Dr. [name]    Past Surgical History:  Procedure Laterality Date  . ABDOMINAL AORTIC ANEURYSM REPAIR    . CARDIAC CATHETERIZATION  2001  . [place]  . CHOLECYSTECTOMY    . COLONOSCOPY    . FRACTURE SURGERY Right    shoulder replacement  . HEMORRHOID BANDING    . ORIF DISTAL RADIUS FRACTURE Left   . UPPER GASTROINTESTINAL ENDOSCOPY      There were no vitals filed for this visit.  Subjective Assessment - 07/28/18 1017    Subjective  Patient reports she felt she slept wrong last night but after stretching this morning is doing better. Reports feeling much better after dry needling last session.     Pertinent History  Patient is a pleasant 62 year old female who presents with cervical neck pain with radiating symptoms to bilateral fingers. 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  Has high stress levels due to caregiver support. Patient has gained 30 lb.  Has AAA and numbness  began seven years ago when aneurysm occured. Sleeping with a neck pillow to relieve pain. Pain has been occurring for years.     Limitations  Sitting;Lifting;Standing;Walking;Writing;House hold activities;Other (comment);Reading    How long can you sit comfortably?  an hour     How long can you stand comfortably?  n/a    How long can you walk comfortably?  n/a    Diagnostic tests  n/a    Patient Stated Goals  decrease pain.     Currently in Pain?  Yes    Pain Score  3     Pain Location  Back    Pain Orientation  Mid;Upper    Pain Descriptors / Indicators  Aching    Pain Type  Chronic pain    Pain Onset  More than a month ago    Pain Frequency  Constant       Prone:    Thoracic mobilizations T1-T11 CPAs and UPAs  Rib  upslip/downslip 10x each rib   Grade I-III CPA and UPAs c2-T1 hypomobile throughout with pain relief upon L C4 ; 4x10 seconds each level Thoracic J sweep against thoracic curvature mobilizations grade I-III 5x10 seconds    Roller to thoracic paraspinals 5 minutes L and R   Supine:   Open prayer stretch 4x 60 seconds each Directon   Sidebending with overpressure stretch 4x 20 seconds with R more tight than L Rotation l and R with overpressure stretch 4x20 seconds with R more tight than L.    Suboccipital release 4x 20 seconds    Trigger point/soft tissue release with movement to improve soft tissue limited muscle tissue length. 5 minutes   Seated scapular retractions 20x      Pt. response to medical necessity:  Patient will continue to benefit from skilled physical therapy to reduce pain, improve cervical mobility, and allow patient better QOL                         PT Education - 07/28/18 0904    Education provided  Yes    Education Details  manual, posture     Person(s) Educated  Patient    Methods  Explanation;Demonstration;Verbal cues    Comprehension  Verbalized understanding;Returned demonstration       PT Short Term Goals - 07/14/18 0945      PT SHORT TERM GOAL #1   Title  Patient will be independent in home exercise program to improve strength/mobility for better functional independence with ADLs.    Baseline  HEP compliance    Time  2    Period  Weeks    Status  Achieved      PT SHORT TERM GOAL #2   Title  Patient will report a worst pain of 5/10 on VAS in neck to improve tolerance with ADLs and reduced symptoms with activities.     Baseline  3/4: worst pain 8/10; 4/3: 6/10 4/30: 7/10 tension headache 5/30 7/10 when lifting husband from floor; 6/27: 5/10     Time  2    Period  Weeks    Status  Achieved      PT SHORT TERM GOAL #3   Title  Patient will report a worst pain of 5/10 on VAS in thoracic spine to improve tolerance with ADLs  and reduced symptoms with activities.     Baseline  7/3: 8/10 7/26: 6/10     Time  2    Period  Weeks  Status  Partially Met    Target Date  07/28/18        PT Long Term Goals - 07/14/18 0940      PT LONG TERM GOAL #1   Title  Patient will report a worst pain of 3/10 on VAS in cervical spine to improve tolerance with ADLs and reduced symptoms with activities    Baseline  worst pain 8/10; 4/3: 6/10 4/30: 7/10 tension headache from caregiver. 5/30: 7/10 from lifting husband; 6/27: 5/10; 7/26: 6/10 from stress    Time  4    Period  Weeks    Status  Partially Met    Target Date  08/11/18      PT LONG TERM GOAL #2   Title  Patient will have no tingling in bilateral hands for two consecutive weeks to improve quality of life.     Baseline   4/3: had no tingling for about a week, came back this week 3x due to husband in the hospital. 4/30: 2x a week. 5/30: no tingling.     Time  4    Period  Weeks    Status  Achieved      PT LONG TERM GOAL #3   Title  Patient will decrease NDI score to 28% to decrease level of perceived disability to minimal.     Baseline  3/4: 46%; 4/3: 44%: 4/30: 54% 5/30: 40% 6/27: 32%    Time  4    Period  Weeks    Status  Partially Met      PT LONG TERM GOAL #4   Title  Patient will improve cervical rotation ROM to 60 degrees bilaterally to improve functional mobility.     Baseline  3/4: R 39 L 41; 4/3: R 42 , L 46 4/30: supine L55 R 54 ; 5/30: seated L 55 R 56; 6/27: L 60 , R 56; 7/26: >60     Time  4    Period  Weeks    Status  Achieved      PT LONG TERM GOAL #5   Title  Patient will have reduced occurances of high levels of pain to improve quality of life    Baseline  initially was everyday now is 2x/week; 6/27: 2x/week ; 7/26: 2x/week     Time  4    Period  Weeks    Status  Partially Met    Target Date  08/14/18      Additional Long Term Goals   Additional Long Term Goals  Yes      PT LONG TERM GOAL #6   Title  Patient will report a worst pain  of 3/10 on VAS in thoracic spine to improve tolerance with ADLs and reduced symptoms with activities.     Baseline  7/3: 8/10 7/26: 6/10     Time  4    Period  Weeks    Status  Partially Met    Target Date  08/11/18      PT LONG TERM GOAL #7   Title  Patient will improve thoracic mobility to allow patient to turn in seat when driving to see behind her for increased safety with driving.     Baseline  7/3: unable to perform ; 7/26: able to perform     Time  4    Period  Weeks    Status  Partially Met    Target Date  08/11/18      PT LONG TERM GOAL #8  Title  Patient will reduce modified Oswestry score to <20 as to demonstrate minimal disability with ADLs including improved sleeping tolerance, walking/sitting tolerance etc for better mobility with ADLs.     Baseline  7/26: 54%    Time  4    Period  Weeks    Status  New    Target Date  08/11/18            Plan - 07/28/18 1019    Clinical Impression Statement  Patient presents with improved muscle tissue length of cervical musculature, however continues to be limited in thoracic mobility by tight paraspinal and musculature. Patient improved in mobility with manual and stretches combined for correction of body alignment. Patient will continue to benefit from skilled physical therapy to reduce pain, improve cervical mobility, and allow patient better QOL    Rehab Potential  Fair    Clinical Impairments Affecting Rehab Potential  + CAD testing, history of AAA, anemia, anxiety, asthma, CAD, chronic combined ystolic heart failure, GERD, Marfan syndrome, OA, RA (+) good work Psychologist, forensic, motivated, age     PT Frequency  2x / week    PT Duration  4 weeks    PT Treatment/Interventions  ADLs/Self Care Home Management;Cryotherapy;Traction;Ultrasound;Therapeutic exercise;Therapeutic activities;Neuromuscular re-education;Patient/family education;Manual techniques;Passive range of motion;Dry needling;Energy conservation;Visual/perceptual  remediation/compensation    PT Next Visit Plan  nerve glides, muscle tissue length, posture    PT Home Exercise Plan  give next session.    Consulted and Agree with Plan of Care  Patient       Patient will benefit from skilled therapeutic intervention in order to improve the following deficits and impairments:  Decreased activity tolerance, Decreased safety awareness, Decreased range of motion, Decreased mobility, Decreased strength, Impaired flexibility, Postural dysfunction, Improper body mechanics, Pain  Visit Diagnosis: Radiculopathy, cervical region  Abnormal posture  Cervicalgia  Pain in thoracic spine     Problem List Patient Active Problem List   Diagnosis Date Noted  . Cough 09/08/2017  . Type 1 dissection of ascending aorta ([place]) 04/23/2017  . HPV (human papilloma virus) infection 12/01/2016  . Screening mammogram, encounter for 11/29/2016  . Estrogen deficiency 11/29/2016  . Encounter for routine gynecological examination 11/29/2016  . Grade III hemorrhoids 10/06/2016  . Tachycardia 08/14/2016  . Hemorrhoids 08/13/2016  . Atypical migraine 08/03/2016  . CAD (coronary artery disease)   . AAA (abdominal aortic aneurysm) ([place])   . Chronic combined systolic (congestive) and diastolic (congestive) heart failure ([place])   . Hyperglycemia 02/11/2016  . Generalized anxiety disorder 12/08/2015  . Panic attacks 07/28/2015  . Sensorineural hearing loss 03/27/2013  . Vitamin D deficiency 07/03/2012  . H/O aortic dissection 05/12/2012  . Obesity (BMI 30.0-34.9) 11/21/2011  . Routine general medical examination at a health care facility 09/20/2011  . S/P AVR (aortic valve replacement) 01/07/2011  . CORONARY ARTERY BYPASS GRAFT, HX OF 01/07/2011  . Arthritis 12/20/2010  . H/O dissecting abdominal aortic aneurysm repair 08/24/2010  . IRRITABLE BOWEL SYNDROME 09/09/2009  . Obstructive sleep apnea 08/04/2009  . External hemorrhoid 06/26/2009  . Osteoporosis 01/09/2009  .  Hyperlipidemia 07/26/2007  . Depression with anxiety 07/26/2007  . Essential hypertension 07/26/2007  . Allergic rhinitis 07/26/2007  . Asthma 07/26/2007  . GERD 07/26/2007  . Osteoarthritis 07/26/2007  . Ocular migraine 07/26/2007  . Mild intermittent asthma without complication 77/02/4034   Janna Arch, PT, DPT   07/28/2018, 10:20 AM  [place] MAIN [facility] SERVICES [place], [place],  [place] Phone: [phone]   Fax:  [phone]  Name: EDOM SCHMUHL MRN: [id number] Date of Birth: Apr 26, 1956

## 2018-07-31 ENCOUNTER — Ambulatory Visit (INDEPENDENT_AMBULATORY_CARE_PROVIDER_SITE_OTHER): Payer: Medicare HMO | Admitting: Psychology

## 2018-07-31 DIAGNOSIS — F4323 Adjustment disorder with mixed anxiety and depressed mood: Secondary | ICD-10-CM | POA: Diagnosis not present

## 2018-08-02 ENCOUNTER — Institutional Professional Consult (permissible substitution): Payer: Self-pay | Admitting: Internal Medicine

## 2018-08-02 ENCOUNTER — Ambulatory Visit: Payer: Medicare HMO

## 2018-08-02 DIAGNOSIS — M5412 Radiculopathy, cervical region: Secondary | ICD-10-CM

## 2018-08-02 DIAGNOSIS — M546 Pain in thoracic spine: Secondary | ICD-10-CM

## 2018-08-02 DIAGNOSIS — M542 Cervicalgia: Secondary | ICD-10-CM | POA: Diagnosis not present

## 2018-08-02 DIAGNOSIS — R293 Abnormal posture: Secondary | ICD-10-CM

## 2018-08-02 NOTE — Therapy (Signed)
[name] MAIN [facility] SERVICES [address] [place], [place], [address] Phone: [phone]   Fax:  [phone]  Physical Therapy Treatment  Patient Details  Name: Rachel Torres MRN: [id number] Date of Birth: [birth year] Referring Provider: Mallie Mussel pool   Encounter Date: 08/02/2018  PT End of Session - 08/02/18 1446    Visit Number  33    Number of Visits  37    Date for PT Re-Evaluation  08/11/18    PT Start Time  1301    PT Stop Time  1345    PT Time Calculation (min)  44 min    Activity Tolerance  Treatment limited secondary to medical complications (Comment);Patient tolerated treatment well    Behavior During Therapy  [facility] for tasks assessed/performed       Past Medical History:  Diagnosis Date  . AAA (abdominal aortic aneurysm) ([place])    a. Chronic w/o evidence of aneurysmal dil on CTA 01/2016.  Marland Kitchen Allergy   . Anemia   . Anxiety   . Asthma   . CAD (coronary artery disease)    a. 08/2010 s/p CABG x 1 (VG->RCA) @ time of Ao dissection repair; b. 01/2016 Lexiscan MV: mid antsept/apical defect w/ ? peri-infarct ischemia-->likely attenuation-->Med Rx.  . Chronic combined systolic (congestive) and diastolic (congestive) heart failure ([place])    a. 2012 EF 30-35%; b. 04/2015 EF 40-45%; c. 01/2016 Echo: Ef 55-65%, nl AoV bioprosthesis, nl RV, nl PASP.  Marland Kitchen Depression   . Gallbladder sludge   . GERD (gastroesophageal reflux disease)   . History of Bicuspid Aortic Valve    a. 08/2010 s/p AVR @ time of Ao dissection repair; b. 01/2016 Echo: Ef 55-65%, nl AoV bioprosthesis, nl RV, nl PASP.  Marland Kitchen Hx of repair of dissecting thoracic aortic aneurysm, Stanford type A    a. 08/2010 s/p repair with AVR and VG->RCA; b. 01/2016 CTA: stable appearance of Asc Thoracic Aortic graft. Opacification of flase lumen of chronic abd Ao dissection w/ retrograde flow through lumbar arteries. No aneurysmal dil of flase lumen.  . Hyperlipidemia   . Hypertensive heart disease   .  IBS (irritable bowel syndrome)   . Internal hemorrhoids   . Marfan syndrome    pt denies  . Migraine   . Obstructive sleep apnea   . Osteoporosis   . Pneumonia   . RA (rheumatoid arthritis) ([place])    a. Followed by Dr. [name]    Past Surgical History:  Procedure Laterality Date  . ABDOMINAL AORTIC ANEURYSM REPAIR    . CARDIAC CATHETERIZATION  2001  . [place]  . CHOLECYSTECTOMY    . COLONOSCOPY    . FRACTURE SURGERY Right    shoulder replacement  . HEMORRHOID BANDING    . ORIF DISTAL RADIUS FRACTURE Left   . UPPER GASTROINTESTINAL ENDOSCOPY      There were no vitals filed for this visit.  Subjective Assessment - 08/02/18 1444    Subjective  Patient continues to have mid back pain that is worsened when she has to pick up her husband from the floor. Has been compliant with HEP.     Pertinent History  Patient is a pleasant 62 year old female who presents with cervical neck pain with radiating symptoms to bilateral fingers. 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  Has high stress levels due to caregiver support. Patient has gained 30 lb.  Has AAA and numbness  began seven years ago when aneurysm occured. Sleeping with a neck pillow to relieve pain. Pain has been occurring for years.     Limitations  Sitting;Lifting;Standing;Walking;Writing;House hold activities;Other (comment);Reading    How long can you sit comfortably?  an hour     How long can you stand comfortably?  n/a    How long can you walk comfortably?  n/a    Diagnostic tests  n/a    Patient Stated Goals  decrease pain.     Currently in Pain?  Yes    Pain Score  3     Pain Location  Back    Pain Orientation  Upper;Mid    Pain Descriptors / Indicators  Aching    Pain Onset  More than a month ago    Pain Frequency  Constant       Prone:    Thoracic mobilizations T1-T11 CPAs and UPAs  Rib upslip/downslip 10x each rib   Grade I-III CPA and UPAs c2-T1  hypomobile throughout with pain relief upon L C4 ; 4x10 seconds each level Thoracic J sweep against thoracic curvature mobilizations grade I-III 4x10 seconds    Roller to thoracic paraspinals 5 minutes L and R   Supine:   Open prayer stretch 4x 60 seconds each Directon   Sidebending with overpressure stretch 4x 20 seconds with R more tight than L Rotation l and R with overpressure stretch 4x20 seconds with R more tight than L.    Suboccipital release 4x 20 seconds    Trigger point/soft tissue release with movement to improve soft tissue limited muscle tissue length. 5 minutes   Seated scapular retractions 20x  seated towel thoracic extensions 20x    Pt. response to medical necessity:  Patient will continue to benefit from skilled physical therapy to reduce pain, improve cervical mobility, and allow patient better QOL                           PT Education - 08/02/18 1445    Education provided  Yes    Education Details  manual, posture    Person(s) Educated  Patient    Methods  Explanation;Demonstration;Verbal cues    Comprehension  Verbalized understanding;Returned demonstration       PT Short Term Goals - 07/14/18 0945      PT SHORT TERM GOAL #1   Title  Patient will be independent in home exercise program to improve strength/mobility for better functional independence with ADLs.    Baseline  HEP compliance    Time  2    Period  Weeks    Status  Achieved      PT SHORT TERM GOAL #2   Title  Patient will report a worst pain of 5/10 on VAS in neck to improve tolerance with ADLs and reduced symptoms with activities.     Baseline  3/4: worst pain 8/10; 4/3: 6/10 4/30: 7/10 tension headache 5/30 7/10 when lifting husband from floor; 6/27: 5/10     Time  2    Period  Weeks    Status  Achieved      PT SHORT TERM GOAL #3   Title  Patient will report a worst pain of 5/10 on VAS in thoracic spine to improve tolerance with ADLs and reduced symptoms with  activities.     Baseline  7/3: 8/10 7/26: 6/10     Time  2    Period  Weeks    Status  Partially Met    Target Date  07/28/18        PT Long Term Goals - 07/14/18 0940      PT LONG TERM GOAL #1   Title  Patient will report a worst pain of 3/10 on VAS in cervical spine to improve tolerance with ADLs and reduced symptoms with activities    Baseline  worst pain 8/10; 4/3: 6/10 4/30: 7/10 tension headache from caregiver. 5/30: 7/10 from lifting husband; 6/27: 5/10; 7/26: 6/10 from stress    Time  4    Period  Weeks    Status  Partially Met    Target Date  08/11/18      PT LONG TERM GOAL #2   Title  Patient will have no tingling in bilateral hands for two consecutive weeks to improve quality of life.     Baseline   4/3: had no tingling for about a week, came back this week 3x due to husband in the hospital. 4/30: 2x a week. 5/30: no tingling.     Time  4    Period  Weeks    Status  Achieved      PT LONG TERM GOAL #3   Title  Patient will decrease NDI score to 28% to decrease level of perceived disability to minimal.     Baseline  3/4: 46%; 4/3: 44%: 4/30: 54% 5/30: 40% 6/27: 32%    Time  4    Period  Weeks    Status  Partially Met      PT LONG TERM GOAL #4   Title  Patient will improve cervical rotation ROM to 60 degrees bilaterally to improve functional mobility.     Baseline  3/4: R 39 L 41; 4/3: R 42 , L 46 4/30: supine L55 R 54 ; 5/30: seated L 55 R 56; 6/27: L 60 , R 56; 7/26: >60     Time  4    Period  Weeks    Status  Achieved      PT LONG TERM GOAL #5   Title  Patient will have reduced occurances of high levels of pain to improve quality of life    Baseline  initially was everyday now is 2x/week; 6/27: 2x/week ; 7/26: 2x/week     Time  4    Period  Weeks    Status  Partially Met    Target Date  08/14/18      Additional Long Term Goals   Additional Long Term Goals  Yes      PT LONG TERM GOAL #6   Title  Patient will report a worst pain of 3/10 on VAS in thoracic  spine to improve tolerance with ADLs and reduced symptoms with activities.     Baseline  7/3: 8/10 7/26: 6/10     Time  4    Period  Weeks    Status  Partially Met    Target Date  08/11/18      PT LONG TERM GOAL #7   Title  Patient will improve thoracic mobility to allow patient to turn in seat when driving to see behind her for increased safety with driving.     Baseline  7/3: unable to perform ; 7/26: able to perform     Time  4    Period  Weeks    Status  Partially Met    Target Date  08/11/18      PT LONG TERM GOAL #8   Title  Patient will reduce modified Oswestry score to <20 as to demonstrate minimal disability with ADLs including improved sleeping tolerance, walking/sitting tolerance etc for better mobility with ADLs.     Baseline  7/26: 54%    Time  4    Period  Weeks    Status  New    Target Date  08/11/18            Plan - 08/02/18 1448    Clinical Impression Statement  Patient presents with decreased mobility of thoracic vertebra that improved with repetition. Supraspinatus musculature tender with limited muscle tissue length. Patient continues to have difficulty with body mechanics due to lifting heavy husband from floor when he falls. Patient will continue to benefit from skilled physical therapy to reduce pain, improve cervical mobility, and allow patient better QOL    Rehab Potential  Fair    Clinical Impairments Affecting Rehab Potential  + CAD testing, history of AAA, anemia, anxiety, asthma, CAD, chronic combined ystolic heart failure, GERD, Marfan syndrome, OA, RA (+) good work Psychologist, forensic, motivated, age     PT Frequency  2x / week    PT Duration  4 weeks    PT Treatment/Interventions  ADLs/Self Care Home Management;Cryotherapy;Traction;Ultrasound;Therapeutic exercise;Therapeutic activities;Neuromuscular re-education;Patient/family education;Manual techniques;Passive range of motion;Dry needling;Energy conservation;Visual/perceptual remediation/compensation    PT  Next Visit Plan  nerve glides, muscle tissue length, posture    PT Home Exercise Plan  give next session.    Consulted and Agree with Plan of Care  Patient       Patient will benefit from skilled therapeutic intervention in order to improve the following deficits and impairments:  Decreased activity tolerance, Decreased safety awareness, Decreased range of motion, Decreased mobility, Decreased strength, Impaired flexibility, Postural dysfunction, Improper body mechanics, Pain  Visit Diagnosis: Radiculopathy, cervical region  Abnormal posture  Cervicalgia  Pain in thoracic spine     Problem List Patient Active Problem List   Diagnosis Date Noted  . Cough 09/08/2017  . Type 1 dissection of ascending aorta ([place]) 04/23/2017  . HPV (human papilloma virus) infection 12/01/2016  . Screening mammogram, encounter for 11/29/2016  . Estrogen deficiency 11/29/2016  . Encounter for routine gynecological examination 11/29/2016  . Grade III hemorrhoids 10/06/2016  . Tachycardia 08/14/2016  . Hemorrhoids 08/13/2016  . Atypical migraine 08/03/2016  . CAD (coronary artery disease)   . AAA (abdominal aortic aneurysm) ([place])   . Chronic combined systolic (congestive) and diastolic (congestive) heart failure ([place])   . Hyperglycemia 02/11/2016  . Generalized anxiety disorder 12/08/2015  . Panic attacks 07/28/2015  . Sensorineural hearing loss 03/27/2013  . Vitamin D deficiency 07/03/2012  . H/O aortic dissection 05/12/2012  . Obesity (BMI 30.0-34.9) 11/21/2011  . Routine general medical examination at a health care facility 09/20/2011  . S/P AVR (aortic valve replacement) 01/07/2011  . CORONARY ARTERY BYPASS GRAFT, HX OF 01/07/2011  . Arthritis 12/20/2010  . H/O dissecting abdominal aortic aneurysm repair 08/24/2010  . IRRITABLE BOWEL SYNDROME 09/09/2009  . Obstructive sleep apnea 08/04/2009  . External hemorrhoid 06/26/2009  . Osteoporosis 01/09/2009  . Hyperlipidemia 07/26/2007  .  Depression with anxiety 07/26/2007  . Essential hypertension 07/26/2007  . Allergic rhinitis 07/26/2007  . Asthma 07/26/2007  . GERD 07/26/2007  . Osteoarthritis 07/26/2007  . Ocular migraine 07/26/2007  . Mild intermittent asthma without complication 60/63/0160   Janna Arch, PT, DPT   08/02/2018, 2:48 PM  [place] MAIN [facility] SERVICES [address] [place], [place], [address]  Phone: [phone]   Fax:  [phone]  Name: Rachel Torres MRN: [id number] Date of Birth: 04/07/1956

## 2018-08-04 ENCOUNTER — Ambulatory Visit: Payer: Medicare HMO

## 2018-08-04 DIAGNOSIS — R293 Abnormal posture: Secondary | ICD-10-CM | POA: Diagnosis not present

## 2018-08-04 DIAGNOSIS — M546 Pain in thoracic spine: Secondary | ICD-10-CM | POA: Diagnosis not present

## 2018-08-04 DIAGNOSIS — M5412 Radiculopathy, cervical region: Secondary | ICD-10-CM

## 2018-08-04 DIAGNOSIS — M542 Cervicalgia: Secondary | ICD-10-CM | POA: Diagnosis not present

## 2018-08-04 NOTE — Therapy (Signed)
[place] MAIN [facility] SERVICES [address] [place], [place], [address] Phone: [phone]   Fax:  [phone]  Physical Therapy Treatment  Patient Details  Name: Rachel Torres MRN: [id number] Date of Birth: 02/03/1956 Referring Provider: Mallie Mussel pool   Encounter Date: 08/04/2018    Past Medical History:  Diagnosis Date  . AAA (abdominal aortic aneurysm) ([place])    a. Chronic w/o evidence of aneurysmal dil on CTA 01/2016.  Marland Kitchen Allergy   . Anemia   . Anxiety   . Asthma   . CAD (coronary artery disease)    a. 08/2010 s/p CABG x 1 (VG->RCA) @ time of Ao dissection repair; b. 01/2016 Lexiscan MV: mid antsept/apical defect w/ ? peri-infarct ischemia-->likely attenuation-->Med Rx.  . Chronic combined systolic (congestive) and diastolic (congestive) heart failure ([place])    a. 2012 EF 30-35%; b. 04/2015 EF 40-45%; c. 01/2016 Echo: Ef 55-65%, nl AoV bioprosthesis, nl RV, nl PASP.  Marland Kitchen Depression   . Gallbladder sludge   . GERD (gastroesophageal reflux disease)   . History of Bicuspid Aortic Valve    a. 08/2010 s/p AVR @ time of Ao dissection repair; b. 01/2016 Echo: Ef 55-65%, nl AoV bioprosthesis, nl RV, nl PASP.  Marland Kitchen Hx of repair of dissecting thoracic aortic aneurysm, Stanford type A    a. 08/2010 s/p repair with AVR and VG->RCA; b. 01/2016 CTA: stable appearance of Asc Thoracic Aortic graft. Opacification of flase lumen of chronic abd Ao dissection w/ retrograde flow through lumbar arteries. No aneurysmal dil of flase lumen.  . Hyperlipidemia   . Hypertensive heart disease   . IBS (irritable bowel syndrome)   . Internal hemorrhoids   . Marfan syndrome    pt denies  . Migraine   . Obstructive sleep apnea   . Osteoporosis   . Pneumonia   . RA (rheumatoid arthritis) ([place])    a. Followed by Dr. [name]    Past Surgical History:  Procedure Laterality Date  . ABDOMINAL AORTIC ANEURYSM REPAIR    . CARDIAC CATHETERIZATION  2001  . [place]  . CHOLECYSTECTOMY    . COLONOSCOPY    . FRACTURE SURGERY Right    shoulder replacement  . HEMORRHOID BANDING    . ORIF DISTAL RADIUS FRACTURE Left   . UPPER GASTROINTESTINAL ENDOSCOPY      There were no vitals filed for this visit.     Prone:    Thoracic mobilizations T1-T11 CPAs and UPAs  Rib upslip/downslip 10x each rib   Grade I-III CPA and UPAs c2-T1 hypomobile throughout with pain relief upon L C4 ; 4x10 seconds each level Thoracic J sweep against thoracic curvature mobilizations grade I-III 4x10 seconds    Roller to thoracic paraspinals 5 minutes L and R   Supine:   Open prayer stretch 4x 60 seconds each Directon   Sidebending with overpressure stretch 4x 20 seconds with R more tight than L Rotation l and R with overpressure stretch 4x20 seconds with R more tight than L.    Suboccipital release 4x 20 seconds    Trigger point/soft tissue release with movement to improve soft tissue limited muscle tissue length. 5 minutes  Half foam roller:   robber stretch 60 seconds, 3 trials;   RTB ER 10x ; 3 sets   Seated scapular retractions 20x  seated towel thoracic extensions 20x  Trigger Point Dry Needling (TDN) (unbilled) Pt provided verbal consent to treatment. TDN performed to with 2,  0.30 x 50 single needle placements to upper trap bilaterally. 2 additional needle placements into suboccipitals on each side. Pt with local twitch response (LTR) with all placements on this date. Pistoning technique utilized. Pt reports decrease in pain following intervention. Lyndel Safe Huprich PT, DPT, GCS     Pt. response to medical necessity:  Patient will continue to benefit from skilled physical therapy to reduce pain, improve cervical mobility, and allow patient better QOL                           PT Short Term Goals - 07/14/18 0945      PT SHORT TERM GOAL #1   Title  Patient will be independent in home exercise program to improve strength/mobility  for better functional independence with ADLs.    Baseline  HEP compliance    Time  2    Period  Weeks    Status  Achieved      PT SHORT TERM GOAL #2   Title  Patient will report a worst pain of 5/10 on VAS in neck to improve tolerance with ADLs and reduced symptoms with activities.     Baseline  3/4: worst pain 8/10; 4/3: 6/10 4/30: 7/10 tension headache 5/30 7/10 when lifting husband from floor; 6/27: 5/10     Time  2    Period  Weeks    Status  Achieved      PT SHORT TERM GOAL #3   Title  Patient will report a worst pain of 5/10 on VAS in thoracic spine to improve tolerance with ADLs and reduced symptoms with activities.     Baseline  7/3: 8/10 7/26: 6/10     Time  2    Period  Weeks    Status  Partially Met    Target Date  07/28/18        PT Long Term Goals - 07/14/18 0940      PT LONG TERM GOAL #1   Title  Patient will report a worst pain of 3/10 on VAS in cervical spine to improve tolerance with ADLs and reduced symptoms with activities    Baseline  worst pain 8/10; 4/3: 6/10 4/30: 7/10 tension headache from caregiver. 5/30: 7/10 from lifting husband; 6/27: 5/10; 7/26: 6/10 from stress    Time  4    Period  Weeks    Status  Partially Met    Target Date  08/11/18      PT LONG TERM GOAL #2   Title  Patient will have no tingling in bilateral hands for two consecutive weeks to improve quality of life.     Baseline   4/3: had no tingling for about a week, came back this week 3x due to husband in the hospital. 4/30: 2x a week. 5/30: no tingling.     Time  4    Period  Weeks    Status  Achieved      PT LONG TERM GOAL #3   Title  Patient will decrease NDI score to 28% to decrease level of perceived disability to minimal.     Baseline  3/4: 46%; 4/3: 44%: 4/30: 54% 5/30: 40% 6/27: 32%    Time  4    Period  Weeks    Status  Partially Met      PT LONG TERM GOAL #4   Title  Patient will improve cervical rotation ROM to 60 degrees bilaterally to improve functional mobility.  Baseline  3/4: R 39 L 41; 4/3: R 42 , L 46 4/30: supine L55 R 54 ; 5/30: seated L 55 R 56; 6/27: L 60 , R 56; 7/26: >60     Time  4    Period  Weeks    Status  Achieved      PT LONG TERM GOAL #5   Title  Patient will have reduced occurances of high levels of pain to improve quality of life    Baseline  initially was everyday now is 2x/week; 6/27: 2x/week ; 7/26: 2x/week     Time  4    Period  Weeks    Status  Partially Met    Target Date  08/14/18      Additional Long Term Goals   Additional Long Term Goals  Yes      PT LONG TERM GOAL #6   Title  Patient will report a worst pain of 3/10 on VAS in thoracic spine to improve tolerance with ADLs and reduced symptoms with activities.     Baseline  7/3: 8/10 7/26: 6/10     Time  4    Period  Weeks    Status  Partially Met    Target Date  08/11/18      PT LONG TERM GOAL #7   Title  Patient will improve thoracic mobility to allow patient to turn in seat when driving to see behind her for increased safety with driving.     Baseline  7/3: unable to perform ; 7/26: able to perform     Time  4    Period  Weeks    Status  Partially Met    Target Date  08/11/18      PT LONG TERM GOAL #8   Title  Patient will reduce modified Oswestry score to <20 as to demonstrate minimal disability with ADLs including improved sleeping tolerance, walking/sitting tolerance etc for better mobility with ADLs.     Baseline  7/26: 54%    Time  4    Period  Weeks    Status  New    Target Date  08/11/18              Patient will benefit from skilled therapeutic intervention in order to improve the following deficits and impairments:     Visit Diagnosis: No diagnosis found.     Problem List Patient Active Problem List   Diagnosis Date Noted  . Cough 09/08/2017  . Type 1 dissection of ascending aorta ([place]) 04/23/2017  . HPV (human papilloma virus) infection 12/01/2016  . Screening mammogram, encounter for 11/29/2016  . Estrogen deficiency  11/29/2016  . Encounter for routine gynecological examination 11/29/2016  . Grade III hemorrhoids 10/06/2016  . Tachycardia 08/14/2016  . Hemorrhoids 08/13/2016  . Atypical migraine 08/03/2016  . CAD (coronary artery disease)   . AAA (abdominal aortic aneurysm) ([place])   . Chronic combined systolic (congestive) and diastolic (congestive) heart failure ([place])   . Hyperglycemia 02/11/2016  . Generalized anxiety disorder 12/08/2015  . Panic attacks 07/28/2015  . Sensorineural hearing loss 03/27/2013  . Vitamin D deficiency 07/03/2012  . H/O aortic dissection 05/12/2012  . Obesity (BMI 30.0-34.9) 11/21/2011  . Routine general medical examination at a health care facility 09/20/2011  . S/P AVR (aortic valve replacement) 01/07/2011  . CORONARY ARTERY BYPASS GRAFT, HX OF 01/07/2011  . Arthritis 12/20/2010  . H/O dissecting abdominal aortic aneurysm repair 08/24/2010  . IRRITABLE BOWEL SYNDROME 09/09/2009  .  Obstructive sleep apnea 08/04/2009  . External hemorrhoid 06/26/2009  . Osteoporosis 01/09/2009  . Hyperlipidemia 07/26/2007  . Depression with anxiety 07/26/2007  . Essential hypertension 07/26/2007  . Allergic rhinitis 07/26/2007  . Asthma 07/26/2007  . GERD 07/26/2007  . Osteoarthritis 07/26/2007  . Ocular migraine 07/26/2007  . Mild intermittent asthma without complication 20/08/4178    Janna Arch 08/04/2018, 9:12 AM  [place] MAIN [facility] SERVICES [address] [place], [place], [address] Phone: [phone]   Fax:  [phone]  Name: KATLEEN CARRAWAY MRN: [id number] Date of Birth: Oct 14, 1956

## 2018-08-07 ENCOUNTER — Ambulatory Visit (INDEPENDENT_AMBULATORY_CARE_PROVIDER_SITE_OTHER): Payer: Medicare HMO | Admitting: Psychology

## 2018-08-07 DIAGNOSIS — F4323 Adjustment disorder with mixed anxiety and depressed mood: Secondary | ICD-10-CM | POA: Diagnosis not present

## 2018-08-08 ENCOUNTER — Ambulatory Visit: Payer: Medicare HMO

## 2018-08-08 DIAGNOSIS — M542 Cervicalgia: Secondary | ICD-10-CM

## 2018-08-08 DIAGNOSIS — M5412 Radiculopathy, cervical region: Secondary | ICD-10-CM

## 2018-08-08 DIAGNOSIS — M546 Pain in thoracic spine: Secondary | ICD-10-CM | POA: Diagnosis not present

## 2018-08-08 DIAGNOSIS — R293 Abnormal posture: Secondary | ICD-10-CM | POA: Diagnosis not present

## 2018-08-08 NOTE — Therapy (Signed)
[place] MAIN [facility] SERVICES [address] [place], [place], [address] Phone: [phone]   Fax:  [phone]  Physical Therapy Treatment  Patient Details  Name: Rachel Torres MRN: [id number] Date of Birth: Apr 28, 1956 Referring Provider: Mallie Mussel pool   Encounter Date: 08/08/2018  PT End of Session - 08/08/18 1035    Visit Number  35    Number of Visits  37    Date for PT Re-Evaluation  08/11/18    PT Start Time  7893    PT Stop Time  1100    PT Time Calculation (min)  45 min    Activity Tolerance  Treatment limited secondary to medical complications (Comment);Patient tolerated treatment well    Behavior During Therapy  [facility] for tasks assessed/performed       Past Medical History:  Diagnosis Date  . AAA (abdominal aortic aneurysm) ([place])    a. Chronic w/o evidence of aneurysmal dil on CTA 01/2016.  Marland Kitchen Allergy   . Anemia   . Anxiety   . Asthma   . CAD (coronary artery disease)    a. 08/2010 s/p CABG x 1 (VG->RCA) @ time of Ao dissection repair; b. 01/2016 Lexiscan MV: mid antsept/apical defect w/ ? peri-infarct ischemia-->likely attenuation-->Med Rx.  . Chronic combined systolic (congestive) and diastolic (congestive) heart failure ([place])    a. 2012 EF 30-35%; b. 04/2015 EF 40-45%; c. 01/2016 Echo: Ef 55-65%, nl AoV bioprosthesis, nl RV, nl PASP.  Marland Kitchen Depression   . Gallbladder sludge   . GERD (gastroesophageal reflux disease)   . History of Bicuspid Aortic Valve    a. 08/2010 s/p AVR @ time of Ao dissection repair; b. 01/2016 Echo: Ef 55-65%, nl AoV bioprosthesis, nl RV, nl PASP.  Marland Kitchen Hx of repair of dissecting thoracic aortic aneurysm, [place] type A    a. 08/2010 s/p repair with AVR and VG->RCA; b. 01/2016 CTA: stable appearance of Asc Thoracic Aortic graft. Opacification of flase lumen of chronic abd Ao dissection w/ retrograde flow through lumbar arteries. No aneurysmal dil of flase lumen.  . Hyperlipidemia   . Hypertensive heart disease   .  IBS (irritable bowel syndrome)   . Internal hemorrhoids   . Marfan syndrome    pt denies  . Migraine   . Obstructive sleep apnea   . Osteoporosis   . Pneumonia   . RA (rheumatoid arthritis) ([place])    a. Followed by Dr. [name]    Past Surgical History:  Procedure Laterality Date  . ABDOMINAL AORTIC ANEURYSM REPAIR    . CARDIAC CATHETERIZATION  2001  . [place]  . CHOLECYSTECTOMY    . COLONOSCOPY    . FRACTURE SURGERY Right    shoulder replacement  . HEMORRHOID BANDING    . ORIF DISTAL RADIUS FRACTURE Left   . UPPER GASTROINTESTINAL ENDOSCOPY      There were no vitals filed for this visit.  Subjective Assessment - 08/08/18 1017    Subjective  Patient reports trying to lift her husband. Has been getting spasm in L ribs past 3-4 days at least once day.     Pertinent History  Patient is a pleasant 62 year old female who presents with cervical neck pain with radiating symptoms to bilateral fingers. 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  Has high stress levels due to caregiver support. Patient has gained 30 lb.  Has AAA and numbness began seven years ago when  aneurysm occured. Sleeping with a neck pillow to relieve pain. Pain has been occurring for years.     Limitations  Sitting;Lifting;Standing;Walking;Writing;House hold activities;Other (comment);Reading    How long can you sit comfortably?  an hour     How long can you stand comfortably?  n/a    How long can you walk comfortably?  n/a    Diagnostic tests  n/a    Patient Stated Goals  decrease pain.     Currently in Pain?  Yes    Pain Score  2     Pain Location  Back    Pain Orientation  Upper;Mid    Pain Descriptors / Indicators  Aching    Pain Type  Chronic pain    Pain Onset  More than a month ago    Pain Frequency  Constant        Prone:    Thoracic mobilizations T1-T11 CPAs and UPAs  Rib upslip/downslip 10x each rib   Grade I-III CPA and UPAs c2-T1  hypomobile throughout with pain relief upon L C4 ; 4x10 seconds each level Thoracic J sweep against thoracic curvature mobilizations grade I-III 4x10 seconds    Roller to thoracic paraspinals 3 minutes L and R  Downslip to T9 rib on L side Supine:   Open prayer stretch 4x 60 seconds each Directon    Suboccipital release 4x 20 seconds   TrA activation hooklying 10 x 3 second holds   TrA activation with marches 10x each leg  Posterior pelvic tilt 10x 5 second holds  Trigger point/soft tissue release with movement to improve soft tissue limited muscle tissue length. 5 minutes   Seated scapular retractions 20x  seated towel thoracic extensions 20x    Pt. response to medical necessity:  Patient will continue to benefit from skilled physical therapy to reduce pain, improve cervical mobility, and allow patient better QOL                         PT Education - 08/08/18 1035    Education provided  Yes    Education Details  manual, posture, therEx    Person(s) Educated  Patient    Methods  Explanation;Demonstration;Verbal cues    Comprehension  Verbalized understanding;Returned demonstration       PT Short Term Goals - 07/14/18 0945      PT SHORT TERM GOAL #1   Title  Patient will be independent in home exercise program to improve strength/mobility for better functional independence with ADLs.    Baseline  HEP compliance    Time  2    Period  Weeks    Status  Achieved      PT SHORT TERM GOAL #2   Title  Patient will report a worst pain of 5/10 on VAS in neck to improve tolerance with ADLs and reduced symptoms with activities.     Baseline  3/4: worst pain 8/10; 4/3: 6/10 4/30: 7/10 tension headache 5/30 7/10 when lifting husband from floor; 6/27: 5/10     Time  2    Period  Weeks    Status  Achieved      PT SHORT TERM GOAL #3   Title  Patient will report a worst pain of 5/10 on VAS in thoracic spine to improve tolerance with ADLs and reduced symptoms  with activities.     Baseline  7/3: 8/10 7/26: 6/10     Time  2    Period  Weeks  Status  Partially Met    Target Date  07/28/18        PT Long Term Goals - 07/14/18 0940      PT LONG TERM GOAL #1   Title  Patient will report a worst pain of 3/10 on VAS in cervical spine to improve tolerance with ADLs and reduced symptoms with activities    Baseline  worst pain 8/10; 4/3: 6/10 4/30: 7/10 tension headache from caregiver. 5/30: 7/10 from lifting husband; 6/27: 5/10; 7/26: 6/10 from stress    Time  4    Period  Weeks    Status  Partially Met    Target Date  08/11/18      PT LONG TERM GOAL #2   Title  Patient will have no tingling in bilateral hands for two consecutive weeks to improve quality of life.     Baseline   4/3: had no tingling for about a week, came back this week 3x due to husband in the hospital. 4/30: 2x a week. 5/30: no tingling.     Time  4    Period  Weeks    Status  Achieved      PT LONG TERM GOAL #3   Title  Patient will decrease NDI score to 28% to decrease level of perceived disability to minimal.     Baseline  3/4: 46%; 4/3: 44%: 4/30: 54% 5/30: 40% 6/27: 32%    Time  4    Period  Weeks    Status  Partially Met      PT LONG TERM GOAL #4   Title  Patient will improve cervical rotation ROM to 60 degrees bilaterally to improve functional mobility.     Baseline  3/4: R 39 L 41; 4/3: R 42 , L 46 4/30: supine L55 R 54 ; 5/30: seated L 55 R 56; 6/27: L 60 , R 56; 7/26: >60     Time  4    Period  Weeks    Status  Achieved      PT LONG TERM GOAL #5   Title  Patient will have reduced occurances of high levels of pain to improve quality of life    Baseline  initially was everyday now is 2x/week; 6/27: 2x/week ; 7/26: 2x/week     Time  4    Period  Weeks    Status  Partially Met    Target Date  08/14/18      Additional Long Term Goals   Additional Long Term Goals  Yes      PT LONG TERM GOAL #6   Title  Patient will report a worst pain of 3/10 on VAS in  thoracic spine to improve tolerance with ADLs and reduced symptoms with activities.     Baseline  7/3: 8/10 7/26: 6/10     Time  4    Period  Weeks    Status  Partially Met    Target Date  08/11/18      PT LONG TERM GOAL #7   Title  Patient will improve thoracic mobility to allow patient to turn in seat when driving to see behind her for increased safety with driving.     Baseline  7/3: unable to perform ; 7/26: able to perform     Time  4    Period  Weeks    Status  Partially Met    Target Date  08/11/18      PT LONG TERM GOAL #8  Title  Patient will reduce modified Oswestry score to <20 as to demonstrate minimal disability with ADLs including improved sleeping tolerance, walking/sitting tolerance etc for better mobility with ADLs.     Baseline  7/26: 54%    Time  4    Period  Weeks    Status  New    Target Date  08/11/18            Plan - 08/08/18 1041    Clinical Impression Statement  Patient educated on transverse abdominus activation for postural correction and support. Improved mobility of thoracic spine noted.  Patient will continue to benefit from skilled physical therapy to reduce pain, improve cervical mobility, and allow patient better QOL    Rehab Potential  Fair    Clinical Impairments Affecting Rehab Potential  + CAD testing, history of AAA, anemia, anxiety, asthma, CAD, chronic combined ystolic heart failure, GERD, Marfan syndrome, OA, RA (+) good work Psychologist, forensic, motivated, age     PT Frequency  2x / week    PT Duration  4 weeks    PT Treatment/Interventions  ADLs/Self Care Home Management;Cryotherapy;Traction;Ultrasound;Therapeutic exercise;Therapeutic activities;Neuromuscular re-education;Patient/family education;Manual techniques;Passive range of motion;Dry needling;Energy conservation;Visual/perceptual remediation/compensation    PT Next Visit Plan  nerve glides, muscle tissue length, posture    PT Home Exercise Plan  give next session.    Consulted and Agree  with Plan of Care  Patient       Patient will benefit from skilled therapeutic intervention in order to improve the following deficits and impairments:  Decreased activity tolerance, Decreased safety awareness, Decreased range of motion, Decreased mobility, Decreased strength, Impaired flexibility, Postural dysfunction, Improper body mechanics, Pain  Visit Diagnosis: Radiculopathy, cervical region  Abnormal posture  Cervicalgia  Pain in thoracic spine     Problem List Patient Active Problem List   Diagnosis Date Noted  . Cough 09/08/2017  . Type 1 dissection of ascending aorta ([place]) 04/23/2017  . HPV (human papilloma virus) infection 12/01/2016  . Screening mammogram, encounter for 11/29/2016  . Estrogen deficiency 11/29/2016  . Encounter for routine gynecological examination 11/29/2016  . Grade III hemorrhoids 10/06/2016  . Tachycardia 08/14/2016  . Hemorrhoids 08/13/2016  . Atypical migraine 08/03/2016  . CAD (coronary artery disease)   . AAA (abdominal aortic aneurysm) ([place])   . Chronic combined systolic (congestive) and diastolic (congestive) heart failure ([place])   . Hyperglycemia 02/11/2016  . Generalized anxiety disorder 12/08/2015  . Panic attacks 07/28/2015  . Sensorineural hearing loss 03/27/2013  . Vitamin D deficiency 07/03/2012  . H/O aortic dissection 05/12/2012  . Obesity (BMI 30.0-34.9) 11/21/2011  . Routine general medical examination at a health care facility 09/20/2011  . S/P AVR (aortic valve replacement) 01/07/2011  . CORONARY ARTERY BYPASS GRAFT, HX OF 01/07/2011  . Arthritis 12/20/2010  . H/O dissecting abdominal aortic aneurysm repair 08/24/2010  . IRRITABLE BOWEL SYNDROME 09/09/2009  . Obstructive sleep apnea 08/04/2009  . External hemorrhoid 06/26/2009  . Osteoporosis 01/09/2009  . Hyperlipidemia 07/26/2007  . Depression with anxiety 07/26/2007  . Essential hypertension 07/26/2007  . Allergic rhinitis 07/26/2007  . Asthma 07/26/2007  .  GERD 07/26/2007  . Osteoarthritis 07/26/2007  . Ocular migraine 07/26/2007  . Mild intermittent asthma without complication 61/44/3154   Janna Arch, PT, DPT   08/08/2018, 10:59 AM  [place] MAIN [facility] SERVICES [address] [place], [place], [address] Phone: [phone]   Fax:  [phone]  Name: Rachel Torres MRN: [id number] Date of Birth: 1955/12/24

## 2018-08-10 ENCOUNTER — Ambulatory Visit: Payer: Medicare HMO

## 2018-08-10 DIAGNOSIS — M542 Cervicalgia: Secondary | ICD-10-CM | POA: Diagnosis not present

## 2018-08-10 DIAGNOSIS — M5412 Radiculopathy, cervical region: Secondary | ICD-10-CM | POA: Diagnosis not present

## 2018-08-10 DIAGNOSIS — M546 Pain in thoracic spine: Secondary | ICD-10-CM

## 2018-08-10 DIAGNOSIS — R293 Abnormal posture: Secondary | ICD-10-CM

## 2018-08-10 NOTE — Therapy (Signed)
[place] MAIN [facility] SERVICES [address] [place], [place], [address] Phone: [phone]   Fax:  [phone]  Physical Therapy Treatment Physical Therapy Progress Note   Dates of reporting period  07/14/18   to   08/10/18  Patient Details  Name: Rachel Torres MRN: [id number] Date of Birth: 06-26-1956 Referring Provider: Mallie Mussel pool   Encounter Date: 08/10/2018  PT End of Session - 08/10/18 0940    Visit Number  36    Number of Visits  45    Date for PT Re-Evaluation  09/07/18    PT Start Time  0930    PT Stop Time  1015    PT Time Calculation (min)  45 min    Activity Tolerance  Treatment limited secondary to medical complications (Comment);Patient tolerated treatment well    Behavior During Therapy  [facility] for tasks assessed/performed       Past Medical History:  Diagnosis Date  . AAA (abdominal aortic aneurysm) ([place])    a. Chronic w/o evidence of aneurysmal dil on CTA 01/2016.  Marland Kitchen Allergy   . Anemia   . Anxiety   . Asthma   . CAD (coronary artery disease)    a. 08/2010 s/p CABG x 1 (VG->RCA) @ time of Ao dissection repair; b. 01/2016 Lexiscan MV: mid antsept/apical defect w/ ? peri-infarct ischemia-->likely attenuation-->Med Rx.  . Chronic combined systolic (congestive) and diastolic (congestive) heart failure ([place])    a. 2012 EF 30-35%; b. 04/2015 EF 40-45%; c. 01/2016 Echo: Ef 55-65%, nl AoV bioprosthesis, nl RV, nl PASP.  Marland Kitchen Depression   . Gallbladder sludge   . GERD (gastroesophageal reflux disease)   . History of Bicuspid Aortic Valve    a. 08/2010 s/p AVR @ time of Ao dissection repair; b. 01/2016 Echo: Ef 55-65%, nl AoV bioprosthesis, nl RV, nl PASP.  Marland Kitchen Hx of repair of dissecting thoracic aortic aneurysm, [place] type A    a. 08/2010 s/p repair with AVR and VG->RCA; b. 01/2016 CTA: stable appearance of Asc Thoracic Aortic graft. Opacification of flase lumen of chronic abd Ao dissection w/ retrograde flow through lumbar arteries. No  aneurysmal dil of flase lumen.  . Hyperlipidemia   . Hypertensive heart disease   . IBS (irritable bowel syndrome)   . Internal hemorrhoids   . Marfan syndrome    pt denies  . Migraine   . Obstructive sleep apnea   . Osteoporosis   . Pneumonia   . RA (rheumatoid arthritis) ([place])    a. Followed by Dr. [name]    Past Surgical History:  Procedure Laterality Date  . ABDOMINAL AORTIC ANEURYSM REPAIR    . CARDIAC CATHETERIZATION  2001  . [place]  . CHOLECYSTECTOMY    . COLONOSCOPY    . FRACTURE SURGERY Right    shoulder replacement  . HEMORRHOID BANDING    . ORIF DISTAL RADIUS FRACTURE Left   . UPPER GASTROINTESTINAL ENDOSCOPY      There were no vitals filed for this visit.  Subjective Assessment - 08/10/18 0933    Subjective  Patient reports compliance with HEP. Having lower back pain in R side. Reports neck is feeling much better now, Mid and lower back mainly the pain location.     Pertinent History  Patient is a pleasant 62 year old female who presents with cervical neck pain with radiating symptoms to bilateral fingers. 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  Has high stress levels due to caregiver support. Patient has gained 30 lb.  Has AAA and numbness began seven years ago when aneurysm occured. Sleeping with a neck pillow to relieve pain. Pain has been occurring for years.     Limitations  Sitting;Lifting;Standing;Walking;Writing;House hold activities;Other (comment);Reading    How long can you sit comfortably?  an hour     How long can you stand comfortably?  n/a    How long can you walk comfortably?  n/a    Diagnostic tests  n/a    Patient Stated Goals  decrease pain.     Currently in Pain?  Yes    Pain Score  3     Pain Location  Back    Pain Orientation  Mid;Lower;Right    Pain Descriptors / Indicators  Aching    Pain Type  Chronic pain    Pain Onset  More than a month ago    Pain Frequency   Constant      VAS:  5/10 R hip, 5/10 mid back NDI: 22% Thoracic mobility to turn while driving: harder to L than to R; R functional  MODI: 52%     Prone:    Thoracic mobilizations T1-T11 CPAs and UPAs  Rib upslip/downslip 10x each rib  Roller to thoracic paraspinals 3 minutes L and R  Downslip to T9 rib on L side Supine:   Open prayer stretch 4x 60 seconds each Directon     TrA activation hooklying 10 x 3 second holds    TrA activation with marches 10x each leg   Posterior pelvic tilt 10x 5 second holds   Half foam roller robber stretch 60 seconds x 2 trials Half foam roller 2x15 ER RTB   Patient's condition has the potential to improve in response to therapy. Maximum improvement is yet to be obtained. The anticipated improvement is attainable and reasonable in a generally predictable time. Start date of reporting period 07/14/18 end date of reporting period 08/10/18. Patient reports that her cervical spine is improved, thoracic and lumbar are main concern now at this time. Will request cert for lumbar region.                         PT Education - 08/10/18 0934    Education provided  Yes    Education Details  manual, posture, recert, lumbar cert    Person(s) Educated  Patient    Methods  Explanation;Demonstration;Verbal cues    Comprehension  Verbalized understanding;Returned demonstration       PT Short Term Goals - 08/10/18 0942      PT SHORT TERM GOAL #1   Title  Patient will be independent in home exercise program to improve strength/mobility for better functional independence with ADLs.    Baseline  HEP compliance    Time  2    Period  Weeks    Status  Achieved      PT SHORT TERM GOAL #2   Title  Patient will report a worst pain of 5/10 on VAS in neck to improve tolerance with ADLs and reduced symptoms with activities.     Baseline  3/4: worst pain 8/10; 4/3: 6/10 4/30: 7/10 tension headache 5/30 7/10 when lifting husband from floor; 6/27:  5/10     Time  2    Period  Weeks    Status  Achieved      PT SHORT TERM GOAL #3   Title  Patient will  report a worst pain of 5/10 on VAS in thoracic spine to improve tolerance with ADLs and reduced symptoms with activities.     Baseline  7/3: 8/10 7/26: 6/10 8/22: 5/10    Time  2    Period  Weeks    Status  Partially Met        PT Long Term Goals - 08/10/18 0936      PT LONG TERM GOAL #1   Title  Patient will report a worst pain of 3/10 on VAS in cervical spine to improve tolerance with ADLs and reduced symptoms with activities    Baseline  worst pain 8/10; 4/3: 6/10 4/30: 7/10 tension headache from caregiver. 5/30: 7/10 from lifting husband; 6/27: 5/10; 7/26: 6/10 from stress 8/22: 1/10    Time  4    Period  Weeks    Status  Achieved      PT LONG TERM GOAL #2   Title  Patient will have no tingling in bilateral hands for two consecutive weeks to improve quality of life.     Baseline   4/3: had no tingling for about a week, came back this week 3x due to husband in the hospital. 4/30: 2x a week. 5/30: no tingling.     Time  4    Period  Weeks    Status  Achieved      PT LONG TERM GOAL #3   Title  Patient will decrease NDI score to 28% to decrease level of perceived disability to minimal.     Baseline  3/4: 46%; 4/3: 44%: 4/30: 54% 5/30: 40% 6/27: 32% 8/22: 22%    Time  4    Period  Weeks    Status  Achieved      PT LONG TERM GOAL #4   Title  Patient will improve cervical rotation ROM to 60 degrees bilaterally to improve functional mobility.     Baseline  3/4: R 39 L 41; 4/3: R 42 , L 46 4/30: supine L55 R 54 ; 5/30: seated L 55 R 56; 6/27: L 60 , R 56; 7/26: >60     Time  4    Period  Weeks    Status  Achieved      PT LONG TERM GOAL #5   Title  Patient will have reduced occurances of high levels of pain to improve quality of life    Baseline  initially was everyday now is 2x/week; 6/27: 2x/week ; 7/26: 2x/week 8/22: 2-3x/ week     Time  4    Period  Weeks    Status   Partially Met      PT LONG TERM GOAL #6   Title  Patient will report a worst pain of 3/10 on VAS in thoracic spine to improve tolerance with ADLs and reduced symptoms with activities.     Baseline  7/3: 8/10 7/26: 6/10  8/22: 5/10     Time  4    Period  Weeks    Status  Partially Met      PT LONG TERM GOAL #7   Title  Patient will improve thoracic mobility to allow patient to turn in seat when driving to see behind her for increased safety with driving.     Baseline  7/3: unable to perform ; 7/26: able to perform 8/22: R limited, L functional    Time  4    Period  Weeks    Status  Partially Met  PT LONG TERM GOAL #8   Title  Patient will reduce modified Oswestry score to <20 as to demonstrate minimal disability with ADLs including improved sleeping tolerance, walking/sitting tolerance etc for better mobility with ADLs.     Baseline  7/26: 54%; 8/22: 52%    Time  4    Period  Weeks    Status  Partially Met            Plan - 08/10/18 1013    Clinical Impression Statement  Cervical spine has been resolved, goals met at this time. Focus on thoracic region will now be plan of care with potential of adding lumbar upon receiving doctors orders. Thoracic limited with R rotation when driving and has hypomobility along spine that improves with repetition. Muscle tension and tissue length is improving with manual and there ex. Patient's condition has the potential to improve in response to therapy. Maximum improvement is yet to be obtained. The anticipated improvement is attainable and reasonable in a generally predictable time. Patient will continue to benefit from skilled physical therapy to reduce pain, improve cervical mobility, and allow patient better QOL    Rehab Potential  Fair    Clinical Impairments Affecting Rehab Potential  + CAD testing, history of AAA, anemia, anxiety, asthma, CAD, chronic combined ystolic heart failure, GERD, Marfan syndrome, OA, RA (+) good work Psychologist, forensic,  motivated, age     PT Frequency  2x / week    PT Duration  4 weeks    PT Treatment/Interventions  ADLs/Self Care Home Management;Cryotherapy;Traction;Ultrasound;Therapeutic exercise;Therapeutic activities;Neuromuscular re-education;Patient/family education;Manual techniques;Passive range of motion;Dry needling;Energy conservation;Visual/perceptual remediation/compensation    PT Next Visit Plan  nerve glides, muscle tissue length, posture    PT Home Exercise Plan  give next session.    Consulted and Agree with Plan of Care  Patient       Patient will benefit from skilled therapeutic intervention in order to improve the following deficits and impairments:  Decreased activity tolerance, Decreased safety awareness, Decreased range of motion, Decreased mobility, Decreased strength, Impaired flexibility, Postural dysfunction, Improper body mechanics, Pain  Visit Diagnosis: Radiculopathy, cervical region  Abnormal posture  Cervicalgia  Pain in thoracic spine     Problem List Patient Active Problem List   Diagnosis Date Noted  . Cough 09/08/2017  . Type 1 dissection of ascending aorta ([place]) 04/23/2017  . HPV (human papilloma virus) infection 12/01/2016  . Screening mammogram, encounter for 11/29/2016  . Estrogen deficiency 11/29/2016  . Encounter for routine gynecological examination 11/29/2016  . Grade III hemorrhoids 10/06/2016  . Tachycardia 08/14/2016  . Hemorrhoids 08/13/2016  . Atypical migraine 08/03/2016  . CAD (coronary artery disease)   . AAA (abdominal aortic aneurysm) ([place])   . Chronic combined systolic (congestive) and diastolic (congestive) heart failure ([place])   . Hyperglycemia 02/11/2016  . Generalized anxiety disorder 12/08/2015  . Panic attacks 07/28/2015  . Sensorineural hearing loss 03/27/2013  . Vitamin D deficiency 07/03/2012  . H/O aortic dissection 05/12/2012  . Obesity (BMI 30.0-34.9) 11/21/2011  . Routine general medical examination at a health care  facility 09/20/2011  . S/P AVR (aortic valve replacement) 01/07/2011  . CORONARY ARTERY BYPASS GRAFT, HX OF 01/07/2011  . Arthritis 12/20/2010  . H/O dissecting abdominal aortic aneurysm repair 08/24/2010  . IRRITABLE BOWEL SYNDROME 09/09/2009  . Obstructive sleep apnea 08/04/2009  . External hemorrhoid 06/26/2009  . Osteoporosis 01/09/2009  . Hyperlipidemia 07/26/2007  . Depression with anxiety 07/26/2007  . Essential hypertension 07/26/2007  . Allergic rhinitis  07/26/2007  . Asthma 07/26/2007  . GERD 07/26/2007  . Osteoarthritis 07/26/2007  . Ocular migraine 07/26/2007  . Mild intermittent asthma without complication 73/22/0254   Janna Arch, PT, DPT   08/10/2018, 12:39 PM  [place] MAIN [facility] SERVICES [address] [place], [place], [address] Phone: [phone]   Fax:  [phone]  Name: Rachel Torres MRN: [id number] Date of Birth: 06-10-1956

## 2018-08-10 NOTE — Addendum Note (Signed)
Addended by: Judene Companion on: 08/10/2018 12:42 PM   Modules accepted: Orders

## 2018-08-14 ENCOUNTER — Ambulatory Visit (INDEPENDENT_AMBULATORY_CARE_PROVIDER_SITE_OTHER): Payer: Medicare HMO | Admitting: Psychology

## 2018-08-14 DIAGNOSIS — F4323 Adjustment disorder with mixed anxiety and depressed mood: Secondary | ICD-10-CM | POA: Diagnosis not present

## 2018-08-15 ENCOUNTER — Ambulatory Visit: Payer: Medicare HMO

## 2018-08-17 ENCOUNTER — Ambulatory Visit: Payer: Medicare HMO

## 2018-08-17 DIAGNOSIS — M546 Pain in thoracic spine: Secondary | ICD-10-CM

## 2018-08-17 DIAGNOSIS — M5412 Radiculopathy, cervical region: Secondary | ICD-10-CM | POA: Diagnosis not present

## 2018-08-17 DIAGNOSIS — M542 Cervicalgia: Secondary | ICD-10-CM | POA: Diagnosis not present

## 2018-08-17 DIAGNOSIS — R293 Abnormal posture: Secondary | ICD-10-CM | POA: Diagnosis not present

## 2018-08-17 NOTE — Therapy (Signed)
[place] MAIN [facility] SERVICES [address] [place], [place], [address] Phone: [phone]   Fax:  [phone]  Physical Therapy Treatment  Patient Details  Name: Rachel Torres MRN: [id number] Date of Birth: Mar 05, 1956 Referring Provider: Mallie Mussel pool   Encounter Date: 08/17/2018  PT End of Session - 08/17/18 0941    Visit Number  37    Number of Visits  45    Date for PT Re-Evaluation  09/07/18    PT Start Time  0955    PT Stop Time  1041    PT Time Calculation (min)  46 min    Activity Tolerance  Treatment limited secondary to medical complications (Comment);Patient tolerated treatment well    Behavior During Therapy  [facility] for tasks assessed/performed       Past Medical History:  Diagnosis Date  . AAA (abdominal aortic aneurysm) ([place])    a. Chronic w/o evidence of aneurysmal dil on CTA 01/2016.  Marland Kitchen Allergy   . Anemia   . Anxiety   . Asthma   . CAD (coronary artery disease)    a. 08/2010 s/p CABG x 1 (VG->RCA) @ time of Ao dissection repair; b. 01/2016 Lexiscan MV: mid antsept/apical defect w/ ? peri-infarct ischemia-->likely attenuation-->Med Rx.  . Chronic combined systolic (congestive) and diastolic (congestive) heart failure ([place])    a. 2012 EF 30-35%; b. 04/2015 EF 40-45%; c. 01/2016 Echo: Ef 55-65%, nl AoV bioprosthesis, nl RV, nl PASP.  Marland Kitchen Depression   . Gallbladder sludge   . GERD (gastroesophageal reflux disease)   . History of Bicuspid Aortic Valve    a. 08/2010 s/p AVR @ time of Ao dissection repair; b. 01/2016 Echo: Ef 55-65%, nl AoV bioprosthesis, nl RV, nl PASP.  Marland Kitchen Hx of repair of dissecting thoracic aortic aneurysm, [place] type A    a. 08/2010 s/p repair with AVR and VG->RCA; b. 01/2016 CTA: stable appearance of Asc Thoracic Aortic graft. Opacification of flase lumen of chronic abd Ao dissection w/ retrograde flow through lumbar arteries. No aneurysmal dil of flase lumen.  . Hyperlipidemia   . Hypertensive heart disease   .  IBS (irritable bowel syndrome)   . Internal hemorrhoids   . Marfan syndrome    pt denies  . Migraine   . Obstructive sleep apnea   . Osteoporosis   . Pneumonia   . RA (rheumatoid arthritis) ([place])    a. Followed by Dr. [name]    Past Surgical History:  Procedure Laterality Date  . ABDOMINAL AORTIC ANEURYSM REPAIR    . CARDIAC CATHETERIZATION  2001  . [place]  . CHOLECYSTECTOMY    . COLONOSCOPY    . FRACTURE SURGERY Right    shoulder replacement  . HEMORRHOID BANDING    . ORIF DISTAL RADIUS FRACTURE Left   . UPPER GASTROINTESTINAL ENDOSCOPY      There were no vitals filed for this visit.  Subjective Assessment - 08/17/18 0940    Subjective  Patient leaving next week for trip to [place]. Patient is excited for her trip, has been busy helping get her husband set up at [facility] for her trip.     Pertinent History  Patient is a pleasant 62 year old female who presents with cervical neck pain with radiating symptoms to bilateral fingers. 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  Has high stress levels due to caregiver support. Patient has gained 30 lb.  Has AAA and numbness began seven years ago when aneurysm occured. Sleeping with a neck pillow to relieve pain. Pain has been occurring for years.     Limitations  Sitting;Lifting;Standing;Walking;Writing;House hold activities;Other (comment);Reading    How long can you sit comfortably?  an hour     How long can you stand comfortably?  n/a    How long can you walk comfortably?  n/a    Diagnostic tests  n/a    Patient Stated Goals  decrease pain.     Currently in Pain?  Yes    Pain Score  2     Pain Location  Back    Pain Orientation  Mid;Lower    Pain Descriptors / Indicators  Aching    Pain Type  Chronic pain    Pain Onset  More than a month ago    Pain Frequency  Constant       Prone:    Thoracic mobilizations T1-T11 CPAs and UPAs ; 5-8 seconds per  level x 4 sets Rib upslip/downslip 10x each rib Downslip to T9 rib on L side painful   Roller to thoracic paraspinals 3 minutes L and R; multiple adhesions throughout paraspinal musculature.  Mobilizations across periscapular border  L and R : slight adhesions released with repetitive grade II mobilizations  Supine:   Open prayer stretch 3x 60 seconds each Directon   TrA activation hooklying 10 x 3 second holds; cues for breathing throughout hold .      TrA activation with marches 10x each leg   Posterior pelvic tilt 10x 5 second holds  Opposite arm leg touches: reverse crunch style : "windmills" 10x each leg/arm   Half foam roller robber stretch 60 seconds x 2 trials: cues for alignment of shoulders into 90 90 positioning for max pectoral stretch.   Half foam roller 2x15 ER RTB   Seated: Rows GTB 10x ; 2 sets cues for not elevating shoulder,  Soft tissue manipulation with movement upper thoracic musculature.                        PT Education - 08/17/18 0941    Education provided  Yes    Education Details  manual, posture     Person(s) Educated  Patient    Methods  Explanation;Demonstration;Verbal cues    Comprehension  Verbalized understanding;Returned demonstration       PT Short Term Goals - 08/10/18 0942      PT SHORT TERM GOAL #1   Title  Patient will be independent in home exercise program to improve strength/mobility for better functional independence with ADLs.    Baseline  HEP compliance    Time  2    Period  Weeks    Status  Achieved      PT SHORT TERM GOAL #2   Title  Patient will report a worst pain of 5/10 on VAS in neck to improve tolerance with ADLs and reduced symptoms with activities.     Baseline  3/4: worst pain 8/10; 4/3: 6/10 4/30: 7/10 tension headache 5/30 7/10 when lifting husband from floor; 6/27: 5/10     Time  2    Period  Weeks    Status  Achieved      PT SHORT TERM GOAL #3   Title  Patient will report a worst pain  of 5/10 on VAS in thoracic spine to improve tolerance with ADLs and reduced symptoms with activities.     Baseline  7/3: 8/10 7/26: 6/10 8/22: 5/10    Time  2    Period  Weeks    Status  Partially Met        PT Long Term Goals - 08/10/18 0936      PT LONG TERM GOAL #1   Title  Patient will report a worst pain of 3/10 on VAS in cervical spine to improve tolerance with ADLs and reduced symptoms with activities    Baseline  worst pain 8/10; 4/3: 6/10 4/30: 7/10 tension headache from caregiver. 5/30: 7/10 from lifting husband; 6/27: 5/10; 7/26: 6/10 from stress 8/22: 1/10    Time  4    Period  Weeks    Status  Achieved      PT LONG TERM GOAL #2   Title  Patient will have no tingling in bilateral hands for two consecutive weeks to improve quality of life.     Baseline   4/3: had no tingling for about a week, came back this week 3x due to husband in the hospital. 4/30: 2x a week. 5/30: no tingling.     Time  4    Period  Weeks    Status  Achieved      PT LONG TERM GOAL #3   Title  Patient will decrease NDI score to 28% to decrease level of perceived disability to minimal.     Baseline  3/4: 46%; 4/3: 44%: 4/30: 54% 5/30: 40% 6/27: 32% 8/22: 22%    Time  4    Period  Weeks    Status  Achieved      PT LONG TERM GOAL #4   Title  Patient will improve cervical rotation ROM to 60 degrees bilaterally to improve functional mobility.     Baseline  3/4: R 39 L 41; 4/3: R 42 , L 46 4/30: supine L55 R 54 ; 5/30: seated L 55 R 56; 6/27: L 60 , R 56; 7/26: >60     Time  4    Period  Weeks    Status  Achieved      PT LONG TERM GOAL #5   Title  Patient will have reduced occurances of high levels of pain to improve quality of life    Baseline  initially was everyday now is 2x/week; 6/27: 2x/week ; 7/26: 2x/week 8/22: 2-3x/ week     Time  4    Period  Weeks    Status  Partially Met      PT LONG TERM GOAL #6   Title  Patient will report a worst pain of 3/10 on VAS in thoracic spine to improve  tolerance with ADLs and reduced symptoms with activities.     Baseline  7/3: 8/10 7/26: 6/10  8/22: 5/10     Time  4    Period  Weeks    Status  Partially Met      PT LONG TERM GOAL #7   Title  Patient will improve thoracic mobility to allow patient to turn in seat when driving to see behind her for increased safety with driving.     Baseline  7/3: unable to perform ; 7/26: able to perform 8/22: R limited, L functional    Time  4    Period  Weeks    Status  Partially Met      PT LONG TERM GOAL #8   Title  Patient will reduce modified Oswestry score to <20 as to demonstrate minimal disability with ADLs including  improved sleeping tolerance, walking/sitting tolerance etc for better mobility with ADLs.     Baseline  7/26: 54%; 8/22: 52%    Time  4    Period  Weeks    Status  Partially Met            Plan - 08/17/18 1021    Clinical Impression Statement  Patient has slight adhesion at T 10 on R side that improved with repetition. Transverse abdominal strengthening for postural correction performed with patient demonstrating improved ability to activate musculature with decreased compensatory recruitment. Patient educated on need to continue HEP while on trip. Patient will continue to benefit from skilled physical therapy to reduce pain, improve spinal mobility, and allow patient better QOL    Rehab Potential  Fair    Clinical Impairments Affecting Rehab Potential  + CAD testing, history of AAA, anemia, anxiety, asthma, CAD, chronic combined ystolic heart failure, GERD, Marfan syndrome, OA, RA (+) good work Psychologist, forensic, motivated, age     PT Frequency  2x / week    PT Duration  4 weeks    PT Treatment/Interventions  ADLs/Self Care Home Management;Cryotherapy;Traction;Ultrasound;Therapeutic exercise;Therapeutic activities;Neuromuscular re-education;Patient/family education;Manual techniques;Passive range of motion;Dry needling;Energy conservation;Visual/perceptual remediation/compensation    PT  Next Visit Plan  nerve glides, muscle tissue length, posture    PT Home Exercise Plan  give next session.    Consulted and Agree with Plan of Care  Patient       Patient will benefit from skilled therapeutic intervention in order to improve the following deficits and impairments:  Decreased activity tolerance, Decreased safety awareness, Decreased range of motion, Decreased mobility, Decreased strength, Impaired flexibility, Postural dysfunction, Improper body mechanics, Pain  Visit Diagnosis: Abnormal posture  Pain in thoracic spine     Problem List Patient Active Problem List   Diagnosis Date Noted  . Cough 09/08/2017  . Type 1 dissection of ascending aorta ([place]) 04/23/2017  . HPV (human papilloma virus) infection 12/01/2016  . Screening mammogram, encounter for 11/29/2016  . Estrogen deficiency 11/29/2016  . Encounter for routine gynecological examination 11/29/2016  . Grade III hemorrhoids 10/06/2016  . Tachycardia 08/14/2016  . Hemorrhoids 08/13/2016  . Atypical migraine 08/03/2016  . CAD (coronary artery disease)   . AAA (abdominal aortic aneurysm) ([place])   . Chronic combined systolic (congestive) and diastolic (congestive) heart failure ([place])   . Hyperglycemia 02/11/2016  . Generalized anxiety disorder 12/08/2015  . Panic attacks 07/28/2015  . Sensorineural hearing loss 03/27/2013  . Vitamin D deficiency 07/03/2012  . H/O aortic dissection 05/12/2012  . Obesity (BMI 30.0-34.9) 11/21/2011  . Routine general medical examination at a health care facility 09/20/2011  . S/P AVR (aortic valve replacement) 01/07/2011  . CORONARY ARTERY BYPASS GRAFT, HX OF 01/07/2011  . Arthritis 12/20/2010  . H/O dissecting abdominal aortic aneurysm repair 08/24/2010  . IRRITABLE BOWEL SYNDROME 09/09/2009  . Obstructive sleep apnea 08/04/2009  . External hemorrhoid 06/26/2009  . Osteoporosis 01/09/2009  . Hyperlipidemia 07/26/2007  . Depression with anxiety 07/26/2007  . Essential  hypertension 07/26/2007  . Allergic rhinitis 07/26/2007  . Asthma 07/26/2007  . GERD 07/26/2007  . Osteoarthritis 07/26/2007  . Ocular migraine 07/26/2007  . Mild intermittent asthma without complication 38/18/2993   Janna Arch, PT, DPT   08/17/2018, 10:46 AM  [place] MAIN [facility] SERVICES [address] [place], [place], [address] Phone: [phone]   Fax:  [phone]  Name: Rachel Torres MRN: [id number] Date of Birth: 1956/07/01

## 2018-09-07 ENCOUNTER — Ambulatory Visit: Payer: Medicare HMO | Attending: Neurosurgery

## 2018-09-07 DIAGNOSIS — G8929 Other chronic pain: Secondary | ICD-10-CM | POA: Insufficient documentation

## 2018-09-07 DIAGNOSIS — R293 Abnormal posture: Secondary | ICD-10-CM | POA: Insufficient documentation

## 2018-09-07 DIAGNOSIS — M545 Low back pain: Secondary | ICD-10-CM | POA: Insufficient documentation

## 2018-09-07 DIAGNOSIS — M546 Pain in thoracic spine: Secondary | ICD-10-CM | POA: Insufficient documentation

## 2018-09-11 ENCOUNTER — Ambulatory Visit (INDEPENDENT_AMBULATORY_CARE_PROVIDER_SITE_OTHER): Payer: Medicare HMO | Admitting: Psychology

## 2018-09-11 ENCOUNTER — Ambulatory Visit: Payer: Medicare HMO

## 2018-09-11 DIAGNOSIS — F4323 Adjustment disorder with mixed anxiety and depressed mood: Secondary | ICD-10-CM | POA: Diagnosis not present

## 2018-09-14 ENCOUNTER — Ambulatory Visit: Payer: Medicare HMO

## 2018-09-14 DIAGNOSIS — M545 Low back pain: Secondary | ICD-10-CM | POA: Diagnosis not present

## 2018-09-14 DIAGNOSIS — G8929 Other chronic pain: Secondary | ICD-10-CM | POA: Diagnosis not present

## 2018-09-14 DIAGNOSIS — M546 Pain in thoracic spine: Secondary | ICD-10-CM | POA: Diagnosis not present

## 2018-09-14 DIAGNOSIS — R293 Abnormal posture: Secondary | ICD-10-CM | POA: Diagnosis not present

## 2018-09-14 NOTE — Therapy (Signed)
[place] MAIN [facility] SERVICES [address] [place], [place], [address] Phone: [phone]   Fax:  [phone]  Physical Therapy Treatment Physical Therapy Progress Note   Dates of reporting period  08/10/18  to   09/14/18  Patient Details  Name: ROMI RATHEL MRN: [id number] Date of Birth: 26-Aug-1956 Referring Provider: Mallie Mussel pool   Encounter Date: 09/14/2018  PT End of Session - 09/14/18 1126    Visit Number  38    Number of Visits  46    Date for PT Re-Evaluation  10/12/18    Authorization Type  1/10 PN start 9/26     PT Start Time  0932    PT Stop Time  1015    PT Time Calculation (min)  43 min    Activity Tolerance  Treatment limited secondary to medical complications (Comment);Patient tolerated treatment well    Behavior During Therapy  [facility] for tasks assessed/performed       Past Medical History:  Diagnosis Date  . AAA (abdominal aortic aneurysm) ([place])    a. Chronic w/o evidence of aneurysmal dil on CTA 01/2016.  Marland Kitchen Allergy   . Anemia   . Anxiety   . Asthma   . CAD (coronary artery disease)    a. 08/2010 s/p CABG x 1 (VG->RCA) @ time of Ao dissection repair; b. 01/2016 Lexiscan MV: mid antsept/apical defect w/ ? peri-infarct ischemia-->likely attenuation-->Med Rx.  . Chronic combined systolic (congestive) and diastolic (congestive) heart failure ([place])    a. 2012 EF 30-35%; b. 04/2015 EF 40-45%; c. 01/2016 Echo: Ef 55-65%, nl AoV bioprosthesis, nl RV, nl PASP.  Marland Kitchen Depression   . Gallbladder sludge   . GERD (gastroesophageal reflux disease)   . History of Bicuspid Aortic Valve    a. 08/2010 s/p AVR @ time of Ao dissection repair; b. 01/2016 Echo: Ef 55-65%, nl AoV bioprosthesis, nl RV, nl PASP.  Marland Kitchen Hx of repair of dissecting thoracic aortic aneurysm, [place] type A    a. 08/2010 s/p repair with AVR and VG->RCA; b. 01/2016 CTA: stable appearance of Asc Thoracic Aortic graft. Opacification of flase lumen of chronic abd Ao dissection w/  retrograde flow through lumbar arteries. No aneurysmal dil of flase lumen.  . Hyperlipidemia   . Hypertensive heart disease   . IBS (irritable bowel syndrome)   . Internal hemorrhoids   . Marfan syndrome    pt denies  . Migraine   . Obstructive sleep apnea   . Osteoporosis   . Pneumonia   . RA (rheumatoid arthritis) ([place])    a. Followed by Dr. [name]    Past Surgical History:  Procedure Laterality Date  . ABDOMINAL AORTIC ANEURYSM REPAIR    . CARDIAC CATHETERIZATION  2001  . [place]  . CHOLECYSTECTOMY    . COLONOSCOPY    . FRACTURE SURGERY Right    shoulder replacement  . HEMORRHOID BANDING    . ORIF DISTAL RADIUS FRACTURE Left   . UPPER GASTROINTESTINAL ENDOSCOPY      There were no vitals filed for this visit.  Subjective Assessment - 09/14/18 0940    Subjective  Patient flight from [place] trip returned last Thursday, being absent form physical therapy for approximately four weeks/one month. Patient had a migraine since returning back. Patient has back pain with household mobility and chores. Has been doing her HEP while on her trip. Patient has new referral for lumbar pain.     Pertinent  History  Patient is a pleasant 62 year old female who presents with cervical neck pain with radiating symptoms to bilateral fingers. 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  Has high stress levels due to caregiver support. Patient has gained 30 lb.  Has AAA and numbness began seven years ago when aneurysm occured. Sleeping with a neck pillow to relieve pain. Pain has been occurring for years.     Limitations  Sitting;Lifting;Standing;Walking;Writing;House hold activities;Other (comment);Reading    How long can you sit comfortably?  an hour     How long can you stand comfortably?  n/a    How long can you walk comfortably?  n/a    Diagnostic tests  n/a    Patient Stated Goals  decrease pain.     Currently in  Pain?  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 . The aids are no longer coming ot help.    Pain  VAS: thoracic worst pain 6/10 current 1/10  Lumbar: worst pain: 7/10 current pain 3/10  Cervical: worst pain; 7/10, current pain: 3/10  Posture Anterior rotated pelvis, muscle guarding of lumbar, and thoracic spine, slight kyphosis of upper thoracic.   ROM  Full trunk Trunk Flexion full  Trunk Extension Limited by dizziness and pain  Trunk R SB 10  Trunk L SB 9  Trunk R rotation WFL Pain  Trunk L rotation WFL pain     -limited iliopsoas bilaterally -limited hamstring bilaterally  Mobilizations:  thoracic and lumbar  : CPA  T9-L1 Painful; hypmobile throughout entire spine'  UPA : painful and hypomobile T-T9,; hypmobile L1-5.    Special tests:  + SLR L -SCOUR R and L -sacral distraction and compression  Knee to chest relieved pain  Prone press up: painful initially to mid thoracic region "felt tight"     MODI: 60% for thoracic and lumbar    Prone:    Thoracic mobilizations T1-T11 CPAs and UPAs ; 5-8 seconds per level x 4 sets Rib upslip/downslip 10x each rib Downslip to T9 rib on L side painful   posterior pelvic tilt 10x 5 second holds     Patient's condition has the potential to improve in response to therapy. Maximum improvement is yet to be obtained. The anticipated improvement is attainable and reasonable in a generally predictable time.  Patient reports that she feels stiffer since being absent from PT for so long.                  PT Education - 09/14/18  1125    Education provided  Yes    Education Details  exercise technique, lumbar assessment, manual     Person(s) Educated  Patient    Methods  Explanation;Demonstration;Verbal cues    Comprehension  Verbalized understanding;Returned demonstration       PT Short Term Goals - 09/14/18 1147  PT SHORT TERM GOAL #1   Title  Patient will be independent in home exercise program to improve strength/mobility for better functional independence with ADLs.    Baseline  HEP compliance    Time  2    Period  Weeks    Status  Achieved      PT SHORT TERM GOAL #2   Title  Patient will report a worst pain of 5/10 on VAS in neck to improve tolerance with ADLs and reduced symptoms with activities.     Baseline  3/4: worst pain 8/10; 4/3: 6/10 4/30: 7/10 tension headache 5/30 7/10 when lifting husband from floor; 6/27: 5/10     Time  2    Period  Weeks    Status  Achieved      PT SHORT TERM GOAL #3   Title  Patient will report a worst pain of 5/10 on VAS in thoracic spine to improve tolerance with ADLs and reduced symptoms with activities.     Baseline  7/3: 8/10 7/26: 6/10 8/22: 5/10 9/26:  6/10     Time  2    Period  Weeks    Status  Partially Met        PT Long Term Goals - 09/14/18 1150      PT LONG TERM GOAL #1   Title  Patient will report a worst pain of 3/10 on VAS in cervical spine to improve tolerance with ADLs and reduced symptoms with activities    Baseline  worst pain 8/10; 4/3: 6/10 4/30: 7/10 tension headache from caregiver. 5/30: 7/10 from lifting husband; 6/27: 5/10; 7/26: 6/10 from stress 8/22: 1/10    Time  4    Period  Weeks    Status  Achieved      PT LONG TERM GOAL #2   Title  Patient will have no tingling in bilateral hands for two consecutive weeks to improve quality of life.     Baseline   4/3: had no tingling for about a week, came back this week 3x due to husband in the hospital. 4/30: 2x a week. 5/30: no tingling.     Time  4    Period  Weeks    Status   Achieved      PT LONG TERM GOAL #3   Title  Patient will decrease NDI score to 28% to decrease level of perceived disability to minimal.     Baseline  3/4: 46%; 4/3: 44%: 4/30: 54% 5/30: 40% 6/27: 32% 8/22: 22%    Time  4    Period  Weeks    Status  Achieved      PT LONG TERM GOAL #4   Title  Patient will improve cervical rotation ROM to 60 degrees bilaterally to improve functional mobility.     Baseline  3/4: R 39 L 41; 4/3: R 42 , L 46 4/30: supine L55 R 54 ; 5/30: seated L 55 R 56; 6/27: L 60 , R 56; 7/26: >60     Time  4    Period  Weeks    Status  Achieved      PT LONG TERM GOAL #5   Title  Patient will have reduced occurances of high levels of pain to improve quality of life    Baseline  initially was everyday now is 2x/week; 6/27: 2x/week ; 7/26: 2x/week 8/22: 2-3x/ week 9/26: 3x/week     Time  4    Period  Weeks  Status  Partially Met    Target Date  10/12/18      Additional Long Term Goals   Additional Long Term Goals  Yes      PT LONG TERM GOAL #6   Title  Patient will report a worst pain of 3/10 on VAS in thoracic spine to improve tolerance with ADLs and reduced symptoms with activities.     Baseline  7/3: 8/10 7/26: 6/10  8/22: 5/10 9/26: 6/10     Time  4    Period  Weeks    Status  Partially Met    Target Date  10/12/18      PT LONG TERM GOAL #7   Title  Patient will improve thoracic mobility to allow patient to turn in seat when driving to see behind her for increased safety with driving.     Baseline  7/3: unable to perform ; 7/26: able to perform 8/22: R limited, L functional 9/26: slightly hypmobile both directions    Time  4    Period  Weeks    Status  Partially Met    Target Date  10/12/18      PT LONG TERM GOAL #8   Title  Patient will reduce modified Oswestry score to <20 as to demonstrate minimal disability with ADLs including improved sleeping tolerance, walking/sitting tolerance etc for better mobility with ADLs.     Baseline  7/26: 54%; 8/22:  52% 9/26: 60%    Time  4    Period  Weeks    Status  Partially Met    Target Date  10/12/18      PT LONG TERM GOAL  #9   TITLE  Patient will report a worst pain of 3/10 on VAS in lumbar spine to improve tolerance with ADLs and reduced symptoms with activities.     Baseline  9/26: 7/10     Time  4    Period  Weeks    Status  New    Target Date  10/12/18            Plan - 09/14/18 1154    Clinical Impression Statement  Patient presents with muscle guarding and hypomobility of lumbar and thoracic spine. New referral for lumbar received and will be added to POC with concurrent thoracic treatment. MODI scored 60% for lumbar and thoracic pain. Patient's worst pain has increased from month long absence from physical therapy in thoracic spine and has high pain in lumbar region as well. Patient's condition has the potential to improve in response to therapy. Maximum improvement is yet to be obtained. The anticipated improvement is attainable and reasonable in a generally predictable time.Patient will continue to benefit from skilled physical therapy to reduce pain, improve spinal mobility, and allow patient better QOL    Rehab Potential  Fair    Clinical Impairments Affecting Rehab Potential  + CAD testing, history of AAA, anemia, anxiety, asthma, CAD, chronic combined ystolic heart failure, GERD, Marfan syndrome, OA, RA (+) good work Psychologist, forensic, motivated, age     PT Frequency  2x / week    PT Duration  4 weeks    PT Treatment/Interventions  ADLs/Self Care Home Management;Cryotherapy;Traction;Ultrasound;Therapeutic exercise;Therapeutic activities;Neuromuscular re-education;Patient/family education;Manual techniques;Passive range of motion;Dry needling;Energy conservation;Visual/perceptual remediation/compensation;Moist Heat;Iontophoresis 47m/ml Dexamethasone;Aquatic Therapy    PT Next Visit Plan  nerve glides, muscle tissue length, posture    PT Home Exercise Plan  give next session.    Consulted  and Agree with Plan of Care  Patient  Patient will benefit from skilled therapeutic intervention in order to improve the following deficits and impairments:  Decreased activity tolerance, Decreased safety awareness, Decreased range of motion, Decreased mobility, Decreased strength, Impaired flexibility, Postural dysfunction, Improper body mechanics, Pain, Hypomobility, Increased fascial restricitons, Increased muscle spasms  Visit Diagnosis: Abnormal posture  Pain in thoracic spine  Chronic low back pain, unspecified back pain laterality, with sciatica presence unspecified     Problem List Patient Active Problem List   Diagnosis Date Noted  . Cough 09/08/2017  . Type 1 dissection of ascending aorta ([place]) 04/23/2017  . HPV (human papilloma virus) infection 12/01/2016  . Screening mammogram, encounter for 11/29/2016  . Estrogen deficiency 11/29/2016  . Encounter for routine gynecological examination 11/29/2016  . Grade III hemorrhoids 10/06/2016  . Tachycardia 08/14/2016  . Hemorrhoids 08/13/2016  . Atypical migraine 08/03/2016  . CAD (coronary artery disease)   . AAA (abdominal aortic aneurysm) ([place])   . Chronic combined systolic (congestive) and diastolic (congestive) heart failure ([place])   . Hyperglycemia 02/11/2016  . Generalized anxiety disorder 12/08/2015  . Panic attacks 07/28/2015  . Sensorineural hearing loss 03/27/2013  . Vitamin D deficiency 07/03/2012  . H/O aortic dissection 05/12/2012  . Obesity (BMI 30.0-34.9) 11/21/2011  . Routine general medical examination at a health care facility 09/20/2011  . S/P AVR (aortic valve replacement) 01/07/2011  . CORONARY ARTERY BYPASS GRAFT, HX OF 01/07/2011  . Arthritis 12/20/2010  . H/O dissecting abdominal aortic aneurysm repair 08/24/2010  . IRRITABLE BOWEL SYNDROME 09/09/2009  . Obstructive sleep apnea 08/04/2009  . External hemorrhoid 06/26/2009  . Osteoporosis 01/09/2009  . Hyperlipidemia 07/26/2007  .  Depression with anxiety 07/26/2007  . Essential hypertension 07/26/2007  . Allergic rhinitis 07/26/2007  . Asthma 07/26/2007  . GERD 07/26/2007  . Osteoarthritis 07/26/2007  . Ocular migraine 07/26/2007  . Mild intermittent asthma without complication [id number]   Janna Arch, PT, DPT   09/14/2018, 11:56 AM  [place] MAIN [facility] SERVICES [address] [place], [place], [address] Phone: [phone]   Fax:  [phone]  Name: LANDA MULLINAX MRN: [id number] Date of Birth: [birth year]

## 2018-09-16 ENCOUNTER — Other Ambulatory Visit: Payer: Self-pay | Admitting: Cardiovascular Disease

## 2018-09-18 ENCOUNTER — Ambulatory Visit: Payer: Medicare HMO | Admitting: Psychology

## 2018-09-19 ENCOUNTER — Ambulatory Visit: Payer: Medicare HMO | Attending: Neurosurgery

## 2018-09-19 DIAGNOSIS — R293 Abnormal posture: Secondary | ICD-10-CM | POA: Diagnosis not present

## 2018-09-19 DIAGNOSIS — M545 Low back pain, unspecified: Secondary | ICD-10-CM

## 2018-09-19 DIAGNOSIS — M542 Cervicalgia: Secondary | ICD-10-CM | POA: Insufficient documentation

## 2018-09-19 DIAGNOSIS — G8929 Other chronic pain: Secondary | ICD-10-CM | POA: Diagnosis not present

## 2018-09-19 DIAGNOSIS — M5412 Radiculopathy, cervical region: Secondary | ICD-10-CM | POA: Diagnosis not present

## 2018-09-19 DIAGNOSIS — M546 Pain in thoracic spine: Secondary | ICD-10-CM | POA: Diagnosis not present

## 2018-09-19 NOTE — Therapy (Signed)
[place] MAIN [facility] SERVICES [address] [place], [place], [address] Phone: [phone]   Fax:  [phone]  Physical Therapy Treatment  Patient Details  Name: Rachel Torres MRN: [id number] Date of Birth: Aug 14, 1956 Referring Provider (PT): [place] [facility]   Encounter Date: 09/19/2018  PT End of Session - 09/19/18 0939    Visit Number  39    Number of Visits  46    Date for PT Re-Evaluation  10/12/18    Authorization Type  2/10 PN start 9/26     PT Start Time  0946    PT Stop Time  1029    PT Time Calculation (min)  43 min    Activity Tolerance  Treatment limited secondary to medical complications (Comment);Patient tolerated treatment well    Behavior During Therapy  [facility] for tasks assessed/performed       Past Medical History:  Diagnosis Date  . AAA (abdominal aortic aneurysm) ([place])    a. Chronic w/o evidence of aneurysmal dil on CTA 01/2016.  Marland Kitchen Allergy   . Anemia   . Anxiety   . Asthma   . CAD (coronary artery disease)    a. 08/2010 s/p CABG x 1 (VG->RCA) @ time of Ao dissection repair; b. 01/2016 Lexiscan MV: mid antsept/apical defect w/ ? peri-infarct ischemia-->likely attenuation-->Med Rx.  . Chronic combined systolic (congestive) and diastolic (congestive) heart failure ([place])    a. 2012 EF 30-35%; b. 04/2015 EF 40-45%; c. 01/2016 Echo: Ef 55-65%, nl AoV bioprosthesis, nl RV, nl PASP.  Marland Kitchen Depression   . Gallbladder sludge   . GERD (gastroesophageal reflux disease)   . History of Bicuspid Aortic Valve    a. 08/2010 s/p AVR @ time of Ao dissection repair; b. 01/2016 Echo: Ef 55-65%, nl AoV bioprosthesis, nl RV, nl PASP.  Marland Kitchen Hx of repair of dissecting thoracic aortic aneurysm, [place] type A    a. 08/2010 s/p repair with AVR and VG->RCA; b. 01/2016 CTA: stable appearance of Asc Thoracic Aortic graft. Opacification of flase lumen of chronic abd Ao dissection w/ retrograde flow through lumbar arteries. No aneurysmal dil of flase lumen.  .  Hyperlipidemia   . Hypertensive heart disease   . IBS (irritable bowel syndrome)   . Internal hemorrhoids   . Marfan syndrome    pt denies  . Migraine   . Obstructive sleep apnea   . Osteoporosis   . Pneumonia   . RA (rheumatoid arthritis) ([place])    a. Followed by Dr. [name]    Past Surgical History:  Procedure Laterality Date  . ABDOMINAL AORTIC ANEURYSM REPAIR    . CARDIAC CATHETERIZATION  2001  . [place]  . CHOLECYSTECTOMY    . COLONOSCOPY    . FRACTURE SURGERY Right    shoulder replacement  . HEMORRHOID BANDING    . ORIF DISTAL RADIUS FRACTURE Left   . UPPER GASTROINTESTINAL ENDOSCOPY      There were no vitals filed for this visit.  Subjective Assessment - 09/19/18 0948    Subjective  Patient reports last night and this morning her R shoulder has been hurting when she is lifting it up to get something. Reports compliance with HEP.     Pertinent History  Patient is a pleasant 62 year old female who presents with cervical neck pain with radiating symptoms to bilateral fingers. 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  Has high stress levels due to  caregiver support. Patient has gained 30 lb.  Has AAA and numbness began seven years ago when aneurysm occured. Sleeping with a neck pillow to relieve pain. Pain has been occurring for years.     Limitations  Sitting;Lifting;Standing;Walking;Writing;House hold activities;Other (comment);Reading    How long can you sit comfortably?  an hour     How long can you stand comfortably?  n/a    How long can you walk comfortably?  n/a    Diagnostic tests  n/a    Patient Stated Goals  decrease pain.     Currently in Pain?  Yes    Pain Score  2     Pain Location  Back    Pain Orientation  Lower;Medial    Pain Descriptors / Indicators  Aching    Pain Type  Chronic pain    Pain Onset  More than a month ago    Pain Frequency  Constant        Prone:    Thoracic and lumbar  mobilizations  CPAs and UPAs ; 5-8 seconds per level x 4 sets  Rib upslip/downslip 10x each rib Downslip to T9 rib on L side painful    Roller to thoracic paraspinals 3 minutes L and R; multiple adhesions throughout paraspinal musculature.   Mobilizations across periscapular border  L and R : slight adhesions released with repetitive grade II mobilizations   Supine:   Open prayer stretch 3x 60 seconds each Directon   TrA activation hooklying 10 x 3 second holds; cues for breathing throughout hold .      TrA activation with marches 10x each leg   Posterior pelvic tilt 10x 5 second holds   Opposite arm leg touches: reverse crunch style : "windmills" 10x each leg/arm   Half foam roller robber stretch 60 seconds x 2 trials: cues for alignment of shoulders into 90 90 positioning for max pectoral stretch.    Half foam roller 2x15 ER RTB   Seated: Rows GTB 10x ; 2 sets cues for not elevating shoulder,  Swiss ball forward rollout on table 10x 5 second holds, at angle 10x 5 seconds each direction  Soft tissue manipulation with movement upper thoracic musculature.                          PT Education - 09/19/18 0939    Education provided  Yes    Education Details  exercise technique, manual     Person(s) Educated  Patient    Methods  Explanation;Demonstration;Verbal cues    Comprehension  Verbalized understanding;Returned demonstration       PT Short Term Goals - 09/14/18 1147      PT SHORT TERM GOAL #1   Title  Patient will be independent in home exercise program to improve strength/mobility for better functional independence with ADLs.    Baseline  HEP compliance    Time  2    Period  Weeks    Status  Achieved      PT SHORT TERM GOAL #2   Title  Patient will report a worst pain of 5/10 on VAS in neck to improve tolerance with ADLs and reduced symptoms with activities.     Baseline  3/4: worst pain 8/10; 4/3: 6/10 4/30: 7/10 tension headache 5/30 7/10  when lifting husband from floor; 6/27: 5/10     Time  2    Period  Weeks    Status  Achieved  PT SHORT TERM GOAL #3   Title  Patient will report a worst pain of 5/10 on VAS in thoracic spine to improve tolerance with ADLs and reduced symptoms with activities.     Baseline  7/3: 8/10 7/26: 6/10 8/22: 5/10 9/26:  6/10     Time  2    Period  Weeks    Status  Partially Met        PT Long Term Goals - 09/14/18 1150      PT LONG TERM GOAL #1   Title  Patient will report a worst pain of 3/10 on VAS in cervical spine to improve tolerance with ADLs and reduced symptoms with activities    Baseline  worst pain 8/10; 4/3: 6/10 4/30: 7/10 tension headache from caregiver. 5/30: 7/10 from lifting husband; 6/27: 5/10; 7/26: 6/10 from stress 8/22: 1/10    Time  4    Period  Weeks    Status  Achieved      PT LONG TERM GOAL #2   Title  Patient will have no tingling in bilateral hands for two consecutive weeks to improve quality of life.     Baseline   4/3: had no tingling for about a week, came back this week 3x due to husband in the hospital. 4/30: 2x a week. 5/30: no tingling.     Time  4    Period  Weeks    Status  Achieved      PT LONG TERM GOAL #3   Title  Patient will decrease NDI score to 28% to decrease level of perceived disability to minimal.     Baseline  3/4: 46%; 4/3: 44%: 4/30: 54% 5/30: 40% 6/27: 32% 8/22: 22%    Time  4    Period  Weeks    Status  Achieved      PT LONG TERM GOAL #4   Title  Patient will improve cervical rotation ROM to 60 degrees bilaterally to improve functional mobility.     Baseline  3/4: R 39 L 41; 4/3: R 42 , L 46 4/30: supine L55 R 54 ; 5/30: seated L 55 R 56; 6/27: L 60 , R 56; 7/26: >60     Time  4    Period  Weeks    Status  Achieved      PT LONG TERM GOAL #5   Title  Patient will have reduced occurances of high levels of pain to improve quality of life    Baseline  initially was everyday now is 2x/week; 6/27: 2x/week ; 7/26: 2x/week 8/22:  2-3x/ week 9/26: 3x/week     Time  4    Period  Weeks    Status  Partially Met    Target Date  10/12/18      Additional Long Term Goals   Additional Long Term Goals  Yes      PT LONG TERM GOAL #6   Title  Patient will report a worst pain of 3/10 on VAS in thoracic spine to improve tolerance with ADLs and reduced symptoms with activities.     Baseline  7/3: 8/10 7/26: 6/10  8/22: 5/10 9/26: 6/10     Time  4    Period  Weeks    Status  Partially Met    Target Date  10/12/18      PT LONG TERM GOAL #7   Title  Patient will improve thoracic mobility to allow patient to turn in seat when driving to  see behind her for increased safety with driving.     Baseline  7/3: unable to perform ; 7/26: able to perform 8/22: R limited, L functional 9/26: slightly hypmobile both directions    Time  4    Period  Weeks    Status  Partially Met    Target Date  10/12/18      PT LONG TERM GOAL #8   Title  Patient will reduce modified Oswestry score to <20 as to demonstrate minimal disability with ADLs including improved sleeping tolerance, walking/sitting tolerance etc for better mobility with ADLs.     Baseline  7/26: 54%; 8/22: 52% 9/26: 60%    Time  4    Period  Weeks    Status  Partially Met    Target Date  10/12/18      PT LONG TERM GOAL  #9   TITLE  Patient will report a worst pain of 3/10 on VAS in lumbar spine to improve tolerance with ADLs and reduced symptoms with activities.     Baseline  9/26: 7/10     Time  4    Period  Weeks    Status  New    Target Date  10/12/18            Plan - 09/19/18 1008    Clinical Impression Statement  Patient presents with lower pain levels today and increased mobility of spinal processes due to decreased muscle tissue length. Pectoral stretching improved patient posture as well as abdominal strengthening interventions. Patient will continue to benefit from skilled physical therapy to reduce pain, improve spinal mobility, and allow patient better QOL     Rehab Potential  Fair    Clinical Impairments Affecting Rehab Potential  + CAD testing, history of AAA, anemia, anxiety, asthma, CAD, chronic combined ystolic heart failure, GERD, Marfan syndrome, OA, RA (+) good work Psychologist, forensic, motivated, age     PT Frequency  2x / week    PT Duration  4 weeks    PT Treatment/Interventions  ADLs/Self Care Home Management;Cryotherapy;Traction;Ultrasound;Therapeutic exercise;Therapeutic activities;Neuromuscular re-education;Patient/family education;Manual techniques;Passive range of motion;Dry needling;Energy conservation;Visual/perceptual remediation/compensation;Moist Heat;Iontophoresis 90m/ml Dexamethasone;Aquatic Therapy    PT Next Visit Plan  nerve glides, muscle tissue length, posture    PT Home Exercise Plan  give next session.    Consulted and Agree with Plan of Care  Patient       Patient will benefit from skilled therapeutic intervention in order to improve the following deficits and impairments:  Decreased activity tolerance, Decreased safety awareness, Decreased range of motion, Decreased mobility, Decreased strength, Impaired flexibility, Postural dysfunction, Improper body mechanics, Pain, Hypomobility, Increased fascial restricitons, Increased muscle spasms  Visit Diagnosis: Abnormal posture  Pain in thoracic spine  Chronic midline low back pain, unspecified whether sciatica present     Problem List Patient Active Problem List   Diagnosis Date Noted  . Cough 09/08/2017  . Type 1 dissection of ascending aorta ([place] 04/23/2017  . HPV (human papilloma virus) infection 12/01/2016  . Screening mammogram, encounter for 11/29/2016  . Estrogen deficiency 11/29/2016  . Encounter for routine gynecological examination 11/29/2016  . Grade III hemorrhoids 10/06/2016  . Tachycardia 08/14/2016  . Hemorrhoids 08/13/2016  . Atypical migraine 08/03/2016  . CAD (coronary artery disease)   . AAA [place] aortic aneurysm) ([place]   . Chronic combined  systolic (congestive) and diastolic (congestive) heart failure ([place]   . Hyperglycemia 02/11/2016  . Generalized anxiety disorder 12/08/2015  . Panic attacks 07/28/2015  . Sensorineural  hearing loss 03/27/2013  . Vitamin D deficiency 07/03/2012  . H/O aortic dissection 05/12/2012  . Obesity (BMI 30.0-34.9) 11/21/2011  . Routine general medical examination at a health care facility 09/20/2011  . S/P AVR (aortic valve replacement) 01/07/2011  . CORONARY ARTERY BYPASS GRAFT, HX OF 01/07/2011  . Arthritis 12/20/2010  . H/O dissecting abdominal aortic aneurysm repair 08/24/2010  . IRRITABLE BOWEL SYNDROME 09/09/2009  . Obstructive sleep apnea 08/04/2009  . External hemorrhoid 06/26/2009  . Osteoporosis 01/09/2009  . Hyperlipidemia 07/26/2007  . Depression with anxiety 07/26/2007  . Essential hypertension 07/26/2007  . Allergic rhinitis 07/26/2007  . Asthma 07/26/2007  . GERD 07/26/2007  . Osteoarthritis 07/26/2007  . Ocular migraine 07/26/2007  . Mild intermittent asthma without complication 79/48/0165   Janna Arch, PT, DPT   09/19/2018, 10:29 AM  [place] MAIN [facility] SERVICES [address] [place], [place], [address] Phone: [phone]   Fax:  [phone]  Name: Rachel Torres MRN: [id number] Date of Birth: February 24, 1956

## 2018-09-22 ENCOUNTER — Ambulatory Visit: Payer: Medicare HMO

## 2018-09-22 DIAGNOSIS — M542 Cervicalgia: Secondary | ICD-10-CM | POA: Diagnosis not present

## 2018-09-22 DIAGNOSIS — M545 Low back pain, unspecified: Secondary | ICD-10-CM

## 2018-09-22 DIAGNOSIS — M546 Pain in thoracic spine: Secondary | ICD-10-CM

## 2018-09-22 DIAGNOSIS — G8929 Other chronic pain: Secondary | ICD-10-CM

## 2018-09-22 DIAGNOSIS — R293 Abnormal posture: Secondary | ICD-10-CM | POA: Diagnosis not present

## 2018-09-22 DIAGNOSIS — M5412 Radiculopathy, cervical region: Secondary | ICD-10-CM | POA: Diagnosis not present

## 2018-09-22 NOTE — Therapy (Signed)
[place] [facility] SERVICES [address] [place], [place], [address] Phone: [phone]   Fax:  [phone]  Physical Therapy Treatment  Patient Details  Name: Rachel Torres MRN: [id number] Date of Birth: 10-14-1956 Referring Provider (PT): [facility]   Encounter Date: 09/22/2018  PT End of Session - 09/22/18 1052    Visit Number  40    Number of Visits  46    Date for PT Re-Evaluation  10/12/18    Authorization Type  3/10 PN start 9/26     PT Start Time  0935    PT Stop Time  1017    PT Time Calculation (min)  42 min    Activity Tolerance  Treatment limited secondary to medical complications (Comment);Patient tolerated treatment well    Behavior During Therapy  [facility] for tasks assessed/performed       Past Medical History:  Diagnosis Date  . AAA (abdominal aortic aneurysm) ([place])    a. Chronic w/o evidence of aneurysmal dil on CTA 01/2016.  Marland Kitchen Allergy   . Anemia   . Anxiety   . Asthma   . CAD (coronary artery disease)    a. 08/2010 s/p CABG x 1 (VG->RCA) @ time of Ao dissection repair; b. 01/2016 Lexiscan MV: mid antsept/apical defect w/ ? peri-infarct ischemia-->likely attenuation-->Med Rx.  . Chronic combined systolic (congestive) and diastolic (congestive) heart failure ([place])    a. 2012 EF 30-35%; b. 04/2015 EF 40-45%; c. 01/2016 Echo: Ef 55-65%, nl AoV bioprosthesis, nl RV, nl PASP.  Marland Kitchen Depression   . Gallbladder sludge   . GERD (gastroesophageal reflux disease)   . History of Bicuspid Aortic Valve    a. 08/2010 s/p AVR @ time of Ao dissection repair; b. 01/2016 Echo: Ef 55-65%, nl AoV bioprosthesis, nl RV, nl PASP.  Marland Kitchen Hx of repair of dissecting thoracic aortic aneurysm, [place] type A    a. 08/2010 s/p repair with AVR and VG->RCA; b. 01/2016 CTA: stable appearance of Asc Thoracic Aortic graft. Opacification of flase lumen of chronic abd Ao dissection w/ retrograde flow through lumbar arteries. No aneurysmal dil of flase lumen.  .  Hyperlipidemia   . Hypertensive heart disease   . IBS (irritable bowel syndrome)   . Internal hemorrhoids   . Marfan syndrome    pt denies  . Migraine   . Obstructive sleep apnea   . Osteoporosis   . Pneumonia   . RA (rheumatoid arthritis) ([place])    a. Followed by Dr. [name]    Past Surgical History:  Procedure Laterality Date  . ABDOMINAL AORTIC ANEURYSM REPAIR    . CARDIAC CATHETERIZATION  2001  . [place]  . CHOLECYSTECTOMY    . COLONOSCOPY    . FRACTURE SURGERY Right    shoulder replacement  . HEMORRHOID BANDING    . ORIF DISTAL RADIUS FRACTURE Left   . UPPER GASTROINTESTINAL ENDOSCOPY      There were no vitals filed for this visit.  Subjective Assessment - 09/22/18 1052    Subjective  Pt reports feeling well today. She denies any pain upon arrival. She will be driving tomorrow to [place]. No specific questions or concerns at this time.     Pertinent History  Patient is a pleasant 62 year old female who presents with cervical neck pain with radiating symptoms to bilateral fingers. 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  Has high stress levels due to  caregiver support. Patient has gained 30 lb.  Has AAA and numbness began seven years ago when aneurysm occured. Sleeping with a neck pillow to relieve pain. Pain has been occurring for years.     Limitations  Sitting;Lifting;Standing;Walking;Writing;House hold activities;Other (comment);Reading    How long can you sit comfortably?  an hour     How long can you stand comfortably?  n/a    How long can you walk comfortably?  n/a    Diagnostic tests  n/a    Patient Stated Goals  decrease pain.     Currently in Pain?  No/denies        TREATMENT  Ther-ex  UBE x 5 minutes for warm-up (unbilled);  Supine: Half foam roller robber stretch 60 seconds x 2 trials: cues for alignment of shoulders into 90 90 positioning for max pectoral stretch. Half foam roller  2x15 ER RTB Posterior pelvic tilt 5 second holds x 10; TrA activation with marches x 10 each leg Opposite arm legpress on pball 5s hold x 10 each side;  Seated: Rows GTB 2 x 10; Pball (green) forward rollout on table 5s hold x 10, at angle 5s hold x 10 each direction    Manual Therapy  Prone STM with roller to thoracic and lumbarparaspinalsL and R; multiple adhesions throughout paraspinal musculature with pain relief reported.    Trigger Point Dry Needling (TDN) (unbilled) Pt provided verbal consent to treatment. TDN performed to with 2, 0.30 x 50 single needle placements to upper trap on R and then 1 additional needle placements into R suboccipitals. Pt with local twitch response (LTR) with all placements on this date. Pistoning technique utilized. Pt reports decrease in pain following intervention.    Pt educated throughout session about proper posture and technique with exercises. Improved exercise technique, movement at target joints, use of target muscles after min to mod verbal, visual, tactile cues.      Patient presents with lower pain levels today. She reports pain relief with STM to thoracic and lumbar paraspinals as well as trigger point dry needling. Pt continues to demonstrate excellent motivation during session. Patient will continue to benefit from skilled physical therapy to reduce pain, improve spinal mobility, and allow patient better quality of life.                 PT Short Term Goals - 09/14/18 1147      PT SHORT TERM GOAL #1   Title  Patient will be independent in home exercise program to improve strength/mobility for better functional independence with ADLs.    Baseline  HEP compliance    Time  2    Period  Weeks    Status  Achieved      PT SHORT TERM GOAL #2   Title  Patient will report a worst pain of 5/10 on VAS in neck to improve tolerance with ADLs and reduced symptoms with activities.     Baseline  3/4: worst pain 8/10; 4/3: 6/10  4/30: 7/10 tension headache 5/30 7/10 when lifting husband from floor; 6/27: 5/10     Time  2    Period  Weeks    Status  Achieved      PT SHORT TERM GOAL #3   Title  Patient will report a worst pain of 5/10 on VAS in thoracic spine to improve tolerance with ADLs and reduced symptoms with activities.     Baseline  7/3: 8/10 7/26: 6/10 8/22: 5/10 9/26:  6/10  Time  2    Period  Weeks    Status  Partially Met        PT Long Term Goals - 09/14/18 1150      PT LONG TERM GOAL #1   Title  Patient will report a worst pain of 3/10 on VAS in cervical spine to improve tolerance with ADLs and reduced symptoms with activities    Baseline  worst pain 8/10; 4/3: 6/10 4/30: 7/10 tension headache from caregiver. 5/30: 7/10 from lifting husband; 6/27: 5/10; 7/26: 6/10 from stress 8/22: 1/10    Time  4    Period  Weeks    Status  Achieved      PT LONG TERM GOAL #2   Title  Patient will have no tingling in bilateral hands for two consecutive weeks to improve quality of life.     Baseline   4/3: had no tingling for about a week, came back this week 3x due to husband in the hospital. 4/30: 2x a week. 5/30: no tingling.     Time  4    Period  Weeks    Status  Achieved      PT LONG TERM GOAL #3   Title  Patient will decrease NDI score to 28% to decrease level of perceived disability to minimal.     Baseline  3/4: 46%; 4/3: 44%: 4/30: 54% 5/30: 40% 6/27: 32% 8/22: 22%    Time  4    Period  Weeks    Status  Achieved      PT LONG TERM GOAL #4   Title  Patient will improve cervical rotation ROM to 60 degrees bilaterally to improve functional mobility.     Baseline  3/4: R 39 L 41; 4/3: R 42 , L 46 4/30: supine L55 R 54 ; 5/30: seated L 55 R 56; 6/27: L 60 , R 56; 7/26: >60     Time  4    Period  Weeks    Status  Achieved      PT LONG TERM GOAL #5   Title  Patient will have reduced occurances of high levels of pain to improve quality of life    Baseline  initially was everyday now is 2x/week;  6/27: 2x/week ; 7/26: 2x/week 8/22: 2-3x/ week 9/26: 3x/week     Time  4    Period  Weeks    Status  Partially Met    Target Date  10/12/18      Additional Long Term Goals   Additional Long Term Goals  Yes      PT LONG TERM GOAL #6   Title  Patient will report a worst pain of 3/10 on VAS in thoracic spine to improve tolerance with ADLs and reduced symptoms with activities.     Baseline  7/3: 8/10 7/26: 6/10  8/22: 5/10 9/26: 6/10     Time  4    Period  Weeks    Status  Partially Met    Target Date  10/12/18      PT LONG TERM GOAL #7   Title  Patient will improve thoracic mobility to allow patient to turn in seat when driving to see behind her for increased safety with driving.     Baseline  7/3: unable to perform ; 7/26: able to perform 8/22: R limited, L functional 9/26: slightly hypmobile both directions    Time  4    Period  Weeks    Status  Partially  Met    Target Date  10/12/18      PT LONG TERM GOAL #8   Title  Patient will reduce modified Oswestry score to <20 as to demonstrate minimal disability with ADLs including improved sleeping tolerance, walking/sitting tolerance etc for better mobility with ADLs.     Baseline  7/26: 54%; 8/22: 52% 9/26: 60%    Time  4    Period  Weeks    Status  Partially Met    Target Date  10/12/18      PT LONG TERM GOAL  #9   TITLE  Patient will report a worst pain of 3/10 on VAS in lumbar spine to improve tolerance with ADLs and reduced symptoms with activities.     Baseline  9/26: 7/10     Time  4    Period  Weeks    Status  New    Target Date  10/12/18            Plan - 09/22/18 1056    Clinical Impression Statement  Patient presents with lower pain levels today. She reports pain relief with STM to thoracic and lumbar paraspinals as well as trigger point dry needling. Pt continues to demonstrate excellent motivation during session. Patient will continue to benefit from skilled physical therapy to reduce pain, improve spinal  mobility, and allow patient better quality of life.    Rehab Potential  Fair    Clinical Impairments Affecting Rehab Potential  + CAD testing, history of AAA, anemia, anxiety, asthma, CAD, chronic combined ystolic heart failure, GERD, Marfan syndrome, OA, RA (+) good work Psychologist, forensic, motivated, age     PT Frequency  2x / week    PT Duration  4 weeks    PT Treatment/Interventions  ADLs/Self Care Home Management;Cryotherapy;Traction;Ultrasound;Therapeutic exercise;Therapeutic activities;Neuromuscular re-education;Patient/family education;Manual techniques;Passive range of motion;Dry needling;Energy conservation;Visual/perceptual remediation/compensation;Moist Heat;Iontophoresis 56m/ml Dexamethasone;Aquatic Therapy    PT Next Visit Plan  nerve glides, muscle tissue length, posture    PT Home Exercise Plan  give next session.    Consulted and Agree with Plan of Care  Patient       Patient will benefit from skilled therapeutic intervention in order to improve the following deficits and impairments:  Decreased activity tolerance, Decreased safety awareness, Decreased range of motion, Decreased mobility, Decreased strength, Impaired flexibility, Postural dysfunction, Improper body mechanics, Pain, Hypomobility, Increased fascial restricitons, Increased muscle spasms  Visit Diagnosis: Pain in thoracic spine  Chronic midline low back pain, unspecified whether sciatica present  Cervicalgia     Problem List Patient Active Problem List   Diagnosis Date Noted  . Cough 09/08/2017  . Type 1 dissection of ascending aorta ([place] 04/23/2017  . HPV (human papilloma virus) infection 12/01/2016  . Screening mammogram, encounter for 11/29/2016  . Estrogen deficiency 11/29/2016  . Encounter for routine gynecological examination 11/29/2016  . Grade III hemorrhoids 10/06/2016  . Tachycardia 08/14/2016  . Hemorrhoids 08/13/2016  . Atypical migraine 08/03/2016  . CAD (coronary artery disease)   . AAA (abdominal  aortic aneurysm) ([place]   . Chronic combined systolic (congestive) and diastolic (congestive) heart failure ([place]   . Hyperglycemia 02/11/2016  . Generalized anxiety disorder 12/08/2015  . Panic attacks 07/28/2015  . Sensorineural hearing loss 03/27/2013  . Vitamin D deficiency 07/03/2012  . H/O aortic dissection 05/12/2012  . Obesity (BMI 30.0-34.9) 11/21/2011  . Routine general medical examination at a health care facility 09/20/2011  . S/P AVR (aortic valve replacement) 01/07/2011  . CORONARY ARTERY BYPASS GRAFT,  HX OF 01/07/2011  . Arthritis 12/20/2010  . H/O dissecting abdominal aortic aneurysm repair 08/24/2010  . IRRITABLE BOWEL SYNDROME 09/09/2009  . Obstructive sleep apnea 08/04/2009  . External hemorrhoid 06/26/2009  . Osteoporosis 01/09/2009  . Hyperlipidemia 07/26/2007  . Depression with anxiety 07/26/2007  . Essential hypertension 07/26/2007  . Allergic rhinitis 07/26/2007  . Asthma 07/26/2007  . GERD 07/26/2007  . Osteoarthritis 07/26/2007  . Ocular migraine 07/26/2007  . Mild intermittent asthma without complication 62/02/5596   Phillips Grout PT, DPT, GCS  Dema Timmons 09/22/2018, 11:01 AM  [place] [facility] SERVICES [address] [place], [place], [address] Phone: [phone]   Fax:  [phone]  Name: Rachel Torres MRN: [id number] Date of Birth: [birth year]

## 2018-09-25 ENCOUNTER — Ambulatory Visit: Payer: Medicare HMO

## 2018-09-25 ENCOUNTER — Ambulatory Visit (INDEPENDENT_AMBULATORY_CARE_PROVIDER_SITE_OTHER): Payer: Medicare HMO | Admitting: Psychology

## 2018-09-25 DIAGNOSIS — M546 Pain in thoracic spine: Secondary | ICD-10-CM | POA: Diagnosis not present

## 2018-09-25 DIAGNOSIS — F4323 Adjustment disorder with mixed anxiety and depressed mood: Secondary | ICD-10-CM | POA: Diagnosis not present

## 2018-09-25 DIAGNOSIS — R293 Abnormal posture: Secondary | ICD-10-CM

## 2018-09-25 DIAGNOSIS — M542 Cervicalgia: Secondary | ICD-10-CM | POA: Diagnosis not present

## 2018-09-25 DIAGNOSIS — G8929 Other chronic pain: Secondary | ICD-10-CM | POA: Diagnosis not present

## 2018-09-25 DIAGNOSIS — M5412 Radiculopathy, cervical region: Secondary | ICD-10-CM | POA: Diagnosis not present

## 2018-09-25 DIAGNOSIS — M545 Low back pain, unspecified: Secondary | ICD-10-CM

## 2018-09-25 NOTE — Therapy (Signed)
[facility] [address] [place], [place], [address] Phone: [phone]   Fax:  [phone]  Physical Therapy Treatment  Patient Details  Name: Rachel Torres MRN: [id number] Date of Birth: 1956/03/04 Referring Provider (PT): [facility]   Encounter Date: 09/25/2018  PT End of Session - 09/25/18 1018    Visit Number  41    Number of Visits  46    Date for PT Re-Evaluation  10/12/18    Authorization Type  4/10 PN start 9/26     PT Start Time  1015    PT Stop Time  1059    PT Time Calculation (min)  44 min    Activity Tolerance  Patient tolerated treatment well    Behavior During Therapy  [facility] for tasks assessed/performed       Past Medical History:  Diagnosis Date  . AAA (abdominal aortic aneurysm) ([place])    a. Chronic w/o evidence of aneurysmal dil on CTA 01/2016.  Marland Kitchen Allergy   . Anemia   . Anxiety   . Asthma   . CAD (coronary artery disease)    a. 08/2010 s/p CABG x 1 (VG->RCA) @ time of Ao dissection repair; b. 01/2016 Lexiscan MV: mid antsept/apical defect w/ ? peri-infarct ischemia-->likely attenuation-->Med Rx.  . Chronic combined systolic (congestive) and diastolic (congestive) heart failure ([place])    a. 2012 EF 30-35%; b. 04/2015 EF 40-45%; c. 01/2016 Echo: Ef 55-65%, nl AoV bioprosthesis, nl RV, nl PASP.  Marland Kitchen Depression   . Gallbladder sludge   . GERD (gastroesophageal reflux disease)   . History of Bicuspid Aortic Valve    a. 08/2010 s/p AVR @ time of Ao dissection repair; b. 01/2016 Echo: Ef 55-65%, nl AoV bioprosthesis, nl RV, nl PASP.  Marland Kitchen Hx of repair of dissecting thoracic aortic aneurysm, Stanford type A    a. 08/2010 s/p repair with AVR and VG->RCA; b. 01/2016 CTA: stable appearance of Asc Thoracic Aortic graft. Opacification of flase lumen of chronic abd Ao dissection w/ retrograde flow through lumbar arteries. No aneurysmal dil of flase lumen.  . Hyperlipidemia   . Hypertensive heart disease   . IBS  (irritable bowel syndrome)   . Internal hemorrhoids   . Marfan syndrome    pt denies  . Migraine   . Obstructive sleep apnea   . Osteoporosis   . Pneumonia   . RA (rheumatoid arthritis) ([place])    a. Followed by Dr. [name]    Past Surgical History:  Procedure Laterality Date  . ABDOMINAL AORTIC ANEURYSM REPAIR    . CARDIAC CATHETERIZATION  2001  . [place]  . CHOLECYSTECTOMY    . COLONOSCOPY    . FRACTURE SURGERY Right    shoulder replacement  . HEMORRHOID BANDING    . ORIF DISTAL RADIUS FRACTURE Left   . UPPER GASTROINTESTINAL ENDOSCOPY      There were no vitals filed for this visit.  Subjective Assessment - 09/25/18 1016    Subjective  Patient reports her midback still hurts when dressing her husband or packing up room to have her mother move in. Tries to remember to hold abdominals in.     Pertinent History  Patient is a pleasant 62 year old female who presents with cervical neck pain with radiating symptoms to bilateral fingers. 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  Has high stress levels due to caregiver support. Patient has gained  30 lb.  Has AAA and numbness began seven years ago when aneurysm occured. Sleeping with a neck pillow to relieve pain. Pain has been occurring for years.     Limitations  Sitting;Lifting;Standing;Walking;Writing;House hold activities;Other (comment);Reading    How long can you sit comfortably?  an hour     How long can you stand comfortably?  n/a    How long can you walk comfortably?  n/a    Diagnostic tests  n/a    Patient Stated Goals  decrease pain.     Currently in Pain?  Yes    Pain Score  2     Pain Location  Back    Pain Orientation  Mid;Lower    Pain Descriptors / Indicators  Aching    Pain Type  Chronic pain    Pain Onset  More than a month ago    Pain Frequency  Constant      TREATMENT   Ther-ex  UBE x 2 minutes forward, x 2 minutes backwards     Supine: Half foam roller robber stretch 60 seconds x 3 trials: cues for alignment of shoulders into 90 90 positioning for max pectoral stretch.  Half foam roller 2x15 ER RTB Posterior pelvic tilt 5 second holds x 15; Opposite arm leg press on pball 5s hold x 10 each side;    Seated: Single arm Rows GTB 2 x 10; Pball (green) forward rollout on table 5s hold x 10, at angle 5s hold x 10 each direction      Manual Therapy  Prone STM with roller to thoracic and lumbarparaspinals L and R; multiple adhesions throughout paraspinal musculature with pain relief reported.   Hip flexor stretch 60 seconds each leg  Thoracic and lumbar mobilizations  CPAs and UPAs ; 5-8 seconds per level x 4 sets                            PT Education - 09/25/18 1018    Education provided  Yes    Education Details  exercise technique, manual     Person(s) Educated  Patient    Methods  Explanation;Demonstration;Verbal cues    Comprehension  Verbalized understanding;Returned demonstration       PT Short Term Goals - 09/14/18 1147      PT SHORT TERM GOAL #1   Title  Patient will be independent in home exercise program to improve strength/mobility for better functional independence with ADLs.    Baseline  HEP compliance    Time  2    Period  Weeks    Status  Achieved      PT SHORT TERM GOAL #2   Title  Patient will report a worst pain of 5/10 on VAS in neck to improve tolerance with ADLs and reduced symptoms with activities.     Baseline  3/4: worst pain 8/10; 4/3: 6/10 4/30: 7/10 tension headache 5/30 7/10 when lifting husband from floor; 6/27: 5/10     Time  2    Period  Weeks    Status  Achieved      PT SHORT TERM GOAL #3   Title  Patient will report a worst pain of 5/10 on VAS in thoracic spine to improve tolerance with ADLs and reduced symptoms with activities.     Baseline  7/3: 8/10 7/26: 6/10 8/22: 5/10 9/26:  6/10     Time  2    Period  Weeks      Status  Partially  Met        PT Long Term Goals - 09/14/18 1150      PT LONG TERM GOAL #1   Title  Patient will report a worst pain of 3/10 on VAS in cervical spine to improve tolerance with ADLs and reduced symptoms with activities    Baseline  worst pain 8/10; 4/3: 6/10 4/30: 7/10 tension headache from caregiver. 5/30: 7/10 from lifting husband; 6/27: 5/10; 7/26: 6/10 from stress 8/22: 1/10    Time  4    Period  Weeks    Status  Achieved      PT LONG TERM GOAL #2   Title  Patient will have no tingling in bilateral hands for two consecutive weeks to improve quality of life.     Baseline   4/3: had no tingling for about a week, came back this week 3x due to husband in the hospital. 4/30: 2x a week. 5/30: no tingling.     Time  4    Period  Weeks    Status  Achieved      PT LONG TERM GOAL #3   Title  Patient will decrease NDI score to 28% to decrease level of perceived disability to minimal.     Baseline  3/4: 46%; 4/3: 44%: 4/30: 54% 5/30: 40% 6/27: 32% 8/22: 22%    Time  4    Period  Weeks    Status  Achieved      PT LONG TERM GOAL #4   Title  Patient will improve cervical rotation ROM to 60 degrees bilaterally to improve functional mobility.     Baseline  3/4: R 39 L 41; 4/3: R 42 , L 46 4/30: supine L55 R 54 ; 5/30: seated L 55 R 56; 6/27: L 60 , R 56; 7/26: >60     Time  4    Period  Weeks    Status  Achieved      PT LONG TERM GOAL #5   Title  Patient will have reduced occurances of high levels of pain to improve quality of life    Baseline  initially was everyday now is 2x/week; 6/27: 2x/week ; 7/26: 2x/week 8/22: 2-3x/ week 9/26: 3x/week     Time  4    Period  Weeks    Status  Partially Met    Target Date  10/12/18      Additional Long Term Goals   Additional Long Term Goals  Yes      PT LONG TERM GOAL #6   Title  Patient will report a worst pain of 3/10 on VAS in thoracic spine to improve tolerance with ADLs and reduced symptoms with activities.     Baseline  7/3: 8/10 7/26: 6/10   8/22: 5/10 9/26: 6/10     Time  4    Period  Weeks    Status  Partially Met    Target Date  10/12/18      PT LONG TERM GOAL #7   Title  Patient will improve thoracic mobility to allow patient to turn in seat when driving to see behind her for increased safety with driving.     Baseline  7/3: unable to perform ; 7/26: able to perform 8/22: R limited, L functional 9/26: slightly hypmobile both directions    Time  4    Period  Weeks    Status  Partially Met    Target Date  10/12/18  PT LONG TERM GOAL #8   Title  Patient will reduce modified Oswestry score to <20 as to demonstrate minimal disability with ADLs including improved sleeping tolerance, walking/sitting tolerance etc for better mobility with ADLs.     Baseline  7/26: 54%; 8/22: 52% 9/26: 60%    Time  4    Period  Weeks    Status  Partially Met    Target Date  10/12/18      PT LONG TERM GOAL  #9   TITLE  Patient will report a worst pain of 3/10 on VAS in lumbar spine to improve tolerance with ADLs and reduced symptoms with activities.     Baseline  9/26: 7/10     Time  4    Period  Weeks    Status  New    Target Date  10/12/18            Plan - 09/25/18 1039    Clinical Impression Statement  Patient introduced to backwards UBE pedaling for UE promoting upright posture. Patient reports improved posture and decreased muscle tension by end of session.  Patient has good motivation for continued progress. Patient challenged with prolonged holds of posture due to limited capacity for prolonged postural mechanics. Patient will continue to benefit from skilled physical therapy to reduce pain, improve spinal mobility, and allow patient better quality of life.    Rehab Potential  Fair    Clinical Impairments Affecting Rehab Potential  + CAD testing, history of AAA, anemia, anxiety, asthma, CAD, chronic combined ystolic heart failure, GERD, Marfan syndrome, OA, RA (+) good work ethic, motivated, age     PT Frequency  2x / week     PT Duration  4 weeks    PT Treatment/Interventions  ADLs/Self Care Home Management;Cryotherapy;Traction;Ultrasound;Therapeutic exercise;Therapeutic activities;Neuromuscular re-education;Patient/family education;Manual techniques;Passive range of motion;Dry needling;Energy conservation;Visual/perceptual remediation/compensation;Moist Heat;Iontophoresis 4mg/ml Dexamethasone;Aquatic Therapy    PT Next Visit Plan  nerve glides, muscle tissue length, posture    PT Home Exercise Plan  give next session.    Consulted and Agree with Plan of Care  Patient       Patient will benefit from skilled therapeutic intervention in order to improve the following deficits and impairments:  Decreased activity tolerance, Decreased safety awareness, Decreased range of motion, Decreased mobility, Decreased strength, Impaired flexibility, Postural dysfunction, Improper body mechanics, Pain, Hypomobility, Increased fascial restricitons, Increased muscle spasms  Visit Diagnosis: Pain in thoracic spine  Chronic midline low back pain, unspecified whether sciatica present  Abnormal posture     Problem List Patient Active Problem List   Diagnosis Date Noted  . Cough 09/08/2017  . Type 1 dissection of ascending aorta ([facility]) 04/23/2017  . HPV (human papilloma virus) infection 12/01/2016  . Screening mammogram, encounter for 11/29/2016  . Estrogen deficiency 11/29/2016  . Encounter for routine gynecological examination 11/29/2016  . Grade III hemorrhoids 10/06/2016  . Tachycardia 08/14/2016  . Hemorrhoids 08/13/2016  . Atypical migraine 08/03/2016  . CAD (coronary artery disease)   . AAA (abdominal aortic aneurysm) ([facility])   . Chronic combined systolic (congestive) and diastolic (congestive) heart failure ([facility])   . Hyperglycemia 02/11/2016  . Generalized anxiety disorder 12/08/2015  . Panic attacks 07/28/2015  . Sensorineural hearing loss 03/27/2013  . Vitamin D deficiency 07/03/2012  . H/O aortic dissection  05/12/2012  . Obesity (BMI 30.0-34.9) 11/21/2011  . Routine general medical examination at a health care facility 09/20/2011  . S/P AVR (aortic valve replacement) 01/07/2011  . CORONARY ARTERY BYPASS   GRAFT, HX OF 01/07/2011  . Arthritis 12/20/2010  . H/O dissecting abdominal aortic aneurysm repair 08/24/2010  . IRRITABLE BOWEL SYNDROME 09/09/2009  . Obstructive sleep apnea 08/04/2009  . External hemorrhoid 06/26/2009  . Osteoporosis 01/09/2009  . Hyperlipidemia 07/26/2007  . Depression with anxiety 07/26/2007  . Essential hypertension 07/26/2007  . Allergic rhinitis 07/26/2007  . Asthma 07/26/2007  . GERD 07/26/2007  . Osteoarthritis 07/26/2007  . Ocular migraine 07/26/2007  . Mild intermittent asthma without complication 82/42/3536   Janna Arch, PT, DPT   09/25/2018, 11:00 AM  [place] [facility]IN [facility] SERVICES [address] [place], [place], [address] Phone: [phone]   Fax:  [phone]  Name: Rachel Torres MRN: [id number] Date of Birth: Apr 23, 1956

## 2018-09-29 ENCOUNTER — Other Ambulatory Visit: Payer: Self-pay | Admitting: Cardiovascular Disease

## 2018-09-29 ENCOUNTER — Ambulatory Visit: Payer: Medicare HMO

## 2018-09-29 ENCOUNTER — Other Ambulatory Visit: Payer: Self-pay | Admitting: Family Medicine

## 2018-10-02 ENCOUNTER — Ambulatory Visit: Payer: Medicare HMO | Admitting: Psychology

## 2018-10-03 ENCOUNTER — Ambulatory Visit: Payer: Medicare HMO

## 2018-10-03 DIAGNOSIS — M546 Pain in thoracic spine: Secondary | ICD-10-CM

## 2018-10-03 DIAGNOSIS — M542 Cervicalgia: Secondary | ICD-10-CM

## 2018-10-03 DIAGNOSIS — R293 Abnormal posture: Secondary | ICD-10-CM | POA: Diagnosis not present

## 2018-10-03 DIAGNOSIS — M5412 Radiculopathy, cervical region: Secondary | ICD-10-CM

## 2018-10-03 DIAGNOSIS — M545 Low back pain, unspecified: Secondary | ICD-10-CM

## 2018-10-03 DIAGNOSIS — G8929 Other chronic pain: Secondary | ICD-10-CM

## 2018-10-03 NOTE — Therapy (Signed)
[place] MAIN [facility] SERVICES [address] [place], [place], [address] Phone: [phone]   Fax:  [phone]  Physical Therapy Treatment  Patient Details  Name: Rachel Torres MRN: [id number] Date of Birth: 11-Apr-1956 Referring Provider (PT): Tonsina pool   Encounter Date: 10/03/2018  PT End of Session - 10/03/18 1009    Visit Number  42    Number of Visits  46    Date for PT Re-Evaluation  10/12/18    Authorization Type  5/10 PN start 9/26     PT Start Time  1015    PT Stop Time  1100    PT Time Calculation (min)  45 min    Activity Tolerance  Patient tolerated treatment well    Behavior During Therapy  [facility] for tasks assessed/performed       Past Medical History:  Diagnosis Date  . AAA (abdominal aortic aneurysm) ([place])    a. Chronic w/o evidence of aneurysmal dil on CTA 01/2016.  Marland Kitchen Allergy   . Anemia   . Anxiety   . Asthma   . CAD (coronary artery disease)    a. 08/2010 s/p CABG x 1 (VG->RCA) @ time of Ao dissection repair; b. 01/2016 Lexiscan MV: mid antsept/apical defect w/ ? peri-infarct ischemia-->likely attenuation-->Med Rx.  . Chronic combined systolic (congestive) and diastolic (congestive) heart failure ([place])    a. 2012 EF 30-35%; b. 04/2015 EF 40-45%; c. 01/2016 Echo: Ef 55-65%, nl AoV bioprosthesis, nl RV, nl PASP.  Marland Kitchen Depression   . Gallbladder sludge   . GERD (gastroesophageal reflux disease)   . History of Bicuspid Aortic Valve    a. 08/2010 s/p AVR @ time of Ao dissection repair; b. 01/2016 Echo: Ef 55-65%, nl AoV bioprosthesis, nl RV, nl PASP.  Marland Kitchen Hx of repair of dissecting thoracic aortic aneurysm, [place] type A    a. 08/2010 s/p repair with AVR and VG->RCA; b. 01/2016 CTA: stable appearance of Asc Thoracic Aortic graft. Opacification of flase lumen of chronic abd Ao dissection w/ retrograde flow through lumbar arteries. No aneurysmal dil of flase lumen.  . Hyperlipidemia   . Hypertensive heart disease   . IBS  (irritable bowel syndrome)   . Internal hemorrhoids   . Marfan syndrome    pt denies  . Migraine   . Obstructive sleep apnea   . Osteoporosis   . Pneumonia   . RA (rheumatoid arthritis) ([place])    a. Followed by Dr. [name]    Past Surgical History:  Procedure Laterality Date  . ABDOMINAL AORTIC ANEURYSM REPAIR    . CARDIAC CATHETERIZATION  2001  . [place]  . CHOLECYSTECTOMY    . COLONOSCOPY    . FRACTURE SURGERY Right    shoulder replacement  . HEMORRHOID BANDING    . ORIF DISTAL RADIUS FRACTURE Left   . UPPER GASTROINTESTINAL ENDOSCOPY      There were no vitals filed for this visit.  Subjective Assessment - 10/03/18 1035    Subjective  Patient reports that she has been moving items around in her house in prep for her mother to move in. States she thinks thats why her pain is slightly higher today.     Pertinent History  Patient is a pleasant 62 year old female who presents with cervical neck pain with radiating symptoms to bilateral fingers. 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  Has high stress levels due to caregiver  support. Patient has gained 30 lb.  Has AAA and numbness began seven years ago when aneurysm occured. Sleeping with a neck pillow to relieve pain. Pain has been occurring for years.     Limitations  Sitting;Lifting;Standing;Walking;Writing;House hold activities;Other (comment);Reading    Currently in Pain?  Yes    Pain Score  3     Pain Location  Back    Pain Orientation  Mid;Lower    Pain Descriptors / Indicators  Aching;Tightness    Pain Type  Chronic pain    Pain Onset  More than a month ago       HEP code: [email or web address]   Ther-ex  Supine: Half foam roller robber stretch 60 seconds x 3 trials: minimal cues needed for proper alignment during stretch Half foam roller 3x10 ER RTB with core bracing  Posterior pelvic tilt 5 second holdsx 15; LTR 10x5s holds with tactile cues with good  carry over with repetition   Seated: Single arm rows GTB2 x 10; Pball(green)forward rollout on table 5s hold x 10,at angle 5s hold x 10each direction Core bracing in sitting with seated marching x10 x5 Seated thoracic extension x10 with 5s holds, tactile cues for neck posture   Manual Therapy ProneSTM with intermittent trigger pointing and myofascial release to thoracicand lumbarparaspinalsL and R; multiple adhesions throughout paraspinal musculaturewith pain relief reported.  Thoracic and lumbar mobilizations CPAs and UPAs grade III/IV ; 5-8 seconds per level x 4 sets. 1 cavitation noted in upper T spine    PT Education - 10/03/18 1009    Education provided  Yes    Education Details  exercise technique/form, HEP    Person(s) Educated  Patient    Methods  Explanation;Demonstration;Verbal cues;Handout    Comprehension  Verbalized understanding;Returned demonstration       PT Short Term Goals - 09/14/18 1147      PT SHORT TERM GOAL #1   Title  Patient will be independent in home exercise program to improve strength/mobility for better functional independence with ADLs.    Baseline  HEP compliance    Time  2    Period  Weeks    Status  Achieved      PT SHORT TERM GOAL #2   Title  Patient will report a worst pain of 5/10 on VAS in neck to improve tolerance with ADLs and reduced symptoms with activities.     Baseline  3/4: worst pain 8/10; 4/3: 6/10 4/30: 7/10 tension headache 5/30 7/10 when lifting husband from floor; 6/27: 5/10     Time  2    Period  Weeks    Status  Achieved      PT SHORT TERM GOAL #3   Title  Patient will report a worst pain of 5/10 on VAS in thoracic spine to improve tolerance with ADLs and reduced symptoms with activities.     Baseline  7/3: 8/10 7/26: 6/10 8/22: 5/10 9/26:  6/10     Time  2    Period  Weeks    Status  Partially Met        PT Long Term Goals - 09/14/18 1150      PT LONG TERM GOAL #1   Title  Patient will report a  worst pain of 3/10 on VAS in cervical spine to improve tolerance with ADLs and reduced symptoms with activities    Baseline  worst pain 8/10; 4/3: 6/10 4/30: 7/10 tension headache from caregiver. 5/30: 7/10 from lifting husband; 6/27: 5/10; 7/26: 6/10  from stress 8/22: 1/10    Time  4    Period  Weeks    Status  Achieved      PT LONG TERM GOAL #2   Title  Patient will have no tingling in bilateral hands for two consecutive weeks to improve quality of life.     Baseline   4/3: had no tingling for about a week, came back this week 3x due to husband in the hospital. 4/30: 2x a week. 5/30: no tingling.     Time  4    Period  Weeks    Status  Achieved      PT LONG TERM GOAL #3   Title  Patient will decrease NDI score to 28% to decrease level of perceived disability to minimal.     Baseline  3/4: 46%; 4/3: 44%: 4/30: 54% 5/30: 40% 6/27: 32% 8/22: 22%    Time  4    Period  Weeks    Status  Achieved      PT LONG TERM GOAL #4   Title  Patient will improve cervical rotation ROM to 60 degrees bilaterally to improve functional mobility.     Baseline  3/4: R 39 L 41; 4/3: R 42 , L 46 4/30: supine L55 R 54 ; 5/30: seated L 55 R 56; 6/27: L 60 , R 56; 7/26: >60     Time  4    Period  Weeks    Status  Achieved      PT LONG TERM GOAL #5   Title  Patient will have reduced occurances of high levels of pain to improve quality of life    Baseline  initially was everyday now is 2x/week; 6/27: 2x/week ; 7/26: 2x/week 8/22: 2-3x/ week 9/26: 3x/week     Time  4    Period  Weeks    Status  Partially Met    Target Date  10/12/18      Additional Long Term Goals   Additional Long Term Goals  Yes      PT LONG TERM GOAL #6   Title  Patient will report a worst pain of 3/10 on VAS in thoracic spine to improve tolerance with ADLs and reduced symptoms with activities.     Baseline  7/3: 8/10 7/26: 6/10  8/22: 5/10 9/26: 6/10     Time  4    Period  Weeks    Status  Partially Met    Target Date  10/12/18       PT LONG TERM GOAL #7   Title  Patient will improve thoracic mobility to allow patient to turn in seat when driving to see behind her for increased safety with driving.     Baseline  7/3: unable to perform ; 7/26: able to perform 8/22: R limited, L functional 9/26: slightly hypmobile both directions    Time  4    Period  Weeks    Status  Partially Met    Target Date  10/12/18      PT LONG TERM GOAL #8   Title  Patient will reduce modified Oswestry score to <20 as to demonstrate minimal disability with ADLs including improved sleeping tolerance, walking/sitting tolerance etc for better mobility with ADLs.     Baseline  7/26: 54%; 8/22: 52% 9/26: 60%    Time  4    Period  Weeks    Status  Partially Met    Target Date  10/12/18  PT LONG TERM GOAL  #9   TITLE  Patient will report a worst pain of 3/10 on VAS in lumbar spine to improve tolerance with ADLs and reduced symptoms with activities.     Baseline  9/26: 7/10     Time  4    Period  Weeks    Status  New    Target Date  10/12/18            Plan - 10/03/18 1217    Clinical Impression Statement  Patient reported decreased pain at end of session, improvement from 3/10 to 1.5/10. STM to thoracic and lumbar paraspinals with soft tissue integrity improved by 25%. CPAs to thoracic spine with repeated mobilizations with patient reported improvement in symptoms. PT and patient reviewed HEP with patient demonstration, patient required occasional cues for proper exercise technique and posture.     Rehab Potential  Fair    PT Frequency  2x / week    PT Duration  4 weeks    PT Treatment/Interventions  ADLs/Self Care Home Management;Cryotherapy;Traction;Ultrasound;Therapeutic exercise;Therapeutic activities;Neuromuscular re-education;Patient/family education;Manual techniques;Passive range of motion;Dry needling;Energy conservation;Visual/perceptual remediation/compensation;Moist Heat;Iontophoresis 24m/ml Dexamethasone;Aquatic Therapy     PT Next Visit Plan  nerve glides, muscle tissue length, posture    PT Home Exercise Plan  HEP administered    Consulted and Agree with Plan of Care  Patient       Patient will benefit from skilled therapeutic intervention in order to improve the following deficits and impairments:  Decreased activity tolerance, Decreased safety awareness, Decreased range of motion, Decreased mobility, Decreased strength, Impaired flexibility, Postural dysfunction, Improper body mechanics, Pain, Hypomobility, Increased fascial restricitons, Increased muscle spasms  Visit Diagnosis: Pain in thoracic spine  Abnormal posture  Cervicalgia  Radiculopathy, cervical region  Chronic midline low back pain, unspecified whether sciatica present     Problem List Patient Active Problem List   Diagnosis Date Noted  . Cough 09/08/2017  . Type 1 dissection of ascending aorta ([place] 04/23/2017  . HPV (human papilloma virus) infection 12/01/2016  . Screening mammogram, encounter for 11/29/2016  . Estrogen deficiency 11/29/2016  . Encounter for routine gynecological examination 11/29/2016  . Grade III hemorrhoids 10/06/2016  . Tachycardia 08/14/2016  . Hemorrhoids 08/13/2016  . Atypical migraine 08/03/2016  . CAD (coronary artery disease)   . [place]) [place]   . Chronic combined systolic (congestive) and diastolic (congestive) heart failure ([place]   . Hyperglycemia 02/11/2016  . Generalized anxiety disorder 12/08/2015  . Panic attacks 07/28/2015  . Sensorineural hearing loss 03/27/2013  . Vitamin D deficiency 07/03/2012  . H/O aortic dissection 05/12/2012  . Obesity (BMI 30.0-34.9) 11/21/2011  . Routine general medical examination at a health care facility 09/20/2011  . S/P AVR (aortic valve replacement) 01/07/2011  . CORONARY ARTERY BYPASS GRAFT, HX OF 01/07/2011  . Arthritis 12/20/2010  . H/O dissecting abdominal aortic aneurysm repair 08/24/2010  . IRRITABLE BOWEL SYNDROME  09/09/2009  . Obstructive sleep apnea 08/04/2009  . External hemorrhoid 06/26/2009  . Osteoporosis 01/09/2009  . Hyperlipidemia 07/26/2007  . Depression with anxiety 07/26/2007  . Essential hypertension 07/26/2007  . Allergic rhinitis 07/26/2007  . Asthma 07/26/2007  . GERD 07/26/2007  . Osteoarthritis 07/26/2007  . Ocular migraine 07/26/2007  . Mild intermittent asthma without complication 026/[id number]   DLieutenant DiegoPT, DPT 12:23 PM,10/03/18 [phone] [place] [facility] [address] [address]Phone: [phone]  Fax:  [phone] [facility]  Rachel Torres MRN: [id number] Date of Birth: 1956-11-13

## 2018-10-03 NOTE — Patient Instructions (Signed)
Access Code: [email or web address]  URL: [email or web address]  Date: 10/03/2018  Prepared by: Lieutenant Diego   Exercises  Supine Lower Trunk Rotation - 10 reps - 3 sets - 5" hold - 1x daily - 7x weekly  Supine Chest Stretch on Foam Roll - 10 reps - 3 sets - 1x daily - 7x weekly  Seated Thoracic Self Mobilization - 10 reps - 3 sets - 5" hold - 1x daily - 7x weekly  Seated Transversus Abdominis Bracing - 10 reps - 3 sets - 1x daily - 7x weekly  Correct Standing Posture - 10 reps - 3 sets - 1x daily - 7x weekly  Correct Seated Posture - 10 reps - 3 sets - 1x daily - 7x weekly  Seated Shoulder Row with Anchored Resistance - 10 reps - 2 sets - 5 hold - 1x daily - 7x weekly  Standing 'L' Stretch at Counter - 10 reps - 2 sets - 5 hold - 1x daily - 7x weekly  Patient Education  Low Back Pain  Low Back Pain Handout

## 2018-10-05 ENCOUNTER — Ambulatory Visit (INDEPENDENT_AMBULATORY_CARE_PROVIDER_SITE_OTHER): Payer: 59

## 2018-10-05 DIAGNOSIS — Z23 Encounter for immunization: Secondary | ICD-10-CM | POA: Diagnosis not present

## 2018-10-06 ENCOUNTER — Ambulatory Visit: Payer: Medicare HMO

## 2018-10-06 DIAGNOSIS — M546 Pain in thoracic spine: Secondary | ICD-10-CM

## 2018-10-06 DIAGNOSIS — R293 Abnormal posture: Secondary | ICD-10-CM | POA: Diagnosis not present

## 2018-10-06 DIAGNOSIS — G8929 Other chronic pain: Secondary | ICD-10-CM | POA: Diagnosis not present

## 2018-10-06 DIAGNOSIS — M545 Low back pain: Secondary | ICD-10-CM | POA: Diagnosis not present

## 2018-10-06 DIAGNOSIS — M542 Cervicalgia: Secondary | ICD-10-CM | POA: Diagnosis not present

## 2018-10-06 DIAGNOSIS — M5412 Radiculopathy, cervical region: Secondary | ICD-10-CM | POA: Diagnosis not present

## 2018-10-06 NOTE — Therapy (Addendum)
[facility] SERVICES [address] [place], [place], [address] Phone: [phone]   Fax:  [phone]  Physical Therapy Treatment  Patient Details  Name: Rachel Torres MRN: [id number] Date of Birth: [birth year] Referring Provider (PT): [place] [facility]   Encounter Date: 10/06/2018  PT End of Session - 10/06/18 1030    Visit Number  43    Number of Visits  46    Date for PT Re-Evaluation  10/12/18    Authorization Type  6/10 PN start 9/26     PT Start Time  0930    PT Stop Time  1018    PT Time Calculation (min)  48 min    Activity Tolerance  Patient tolerated treatment well    Behavior During Therapy  [facility] for tasks assessed/performed       Past Medical History:  Diagnosis Date  . AAA (abdominal aortic aneurysm) ([place])    a. Chronic w/o evidence of aneurysmal dil on CTA 01/2016.  Marland Kitchen Allergy   . Anemia   . Anxiety   . Asthma   . CAD (coronary artery disease)    a. 08/2010 s/p CABG x 1 (VG->RCA) @ time of Ao dissection repair; b. 01/2016 Lexiscan MV: mid antsept/apical defect w/ ? peri-infarct ischemia-->likely attenuation-->Med Rx.  . Chronic combined systolic (congestive) and diastolic (congestive) heart failure ([place])    a. 2012 EF 30-35%; b. 04/2015 EF 40-45%; c. 01/2016 Echo: Ef 55-65%, nl AoV bioprosthesis, nl RV, nl PASP.  Marland Kitchen Depression   . Gallbladder sludge   . GERD (gastroesophageal reflux disease)   . History of Bicuspid Aortic Valve    a. 08/2010 s/p AVR @ time of Ao dissection repair; b. 01/2016 Echo: Ef 55-65%, nl AoV bioprosthesis, nl RV, nl PASP.  Marland Kitchen Hx of repair of dissecting thoracic aortic aneurysm, [place] type A    a. 08/2010 s/p repair with AVR and VG->RCA; b. 01/2016 CTA: stable appearance of Asc Thoracic Aortic graft. Opacification of flase lumen of chronic abd Ao dissection w/ retrograde flow through lumbar arteries. No aneurysmal dil of flase lumen.  . Hyperlipidemia   . Hypertensive heart disease   . IBS  (irritable bowel syndrome)   . Internal hemorrhoids   . Marfan syndrome    pt denies  . Migraine   . Obstructive sleep apnea   . Osteoporosis   . Pneumonia   . RA (rheumatoid arthritis) ([place])    a. Followed by Dr. [name]    Past Surgical History:  Procedure Laterality Date  . ABDOMINAL AORTIC ANEURYSM REPAIR    . CARDIAC CATHETERIZATION  2001  . [place]  . CHOLECYSTECTOMY    . COLONOSCOPY    . FRACTURE SURGERY Right    shoulder replacement  . HEMORRHOID BANDING    . ORIF DISTAL RADIUS FRACTURE Left   . UPPER GASTROINTESTINAL ENDOSCOPY      There were no vitals filed for this visit.  Subjective Assessment - 10/06/18 1028    Subjective  Patient reports she has increased back pain today due to helping husband get dressed. States her mother officially moves in next week. Reports her physioball and half foam roller comes in today so she can work on exercises and stretches at home.    Pertinent History  Patient is a pleasant 62 year old female who presents with cervical neck pain with radiating symptoms to bilateral fingers. 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  Has high stress levels due to caregiver support. Patient has gained 30 lb.  Has AAA and numbness began seven years ago when aneurysm occured. Sleeping with a neck pillow to relieve pain. Pain has been occurring for years.     Limitations  Sitting;Lifting;Standing;Walking;Writing;House hold activities;Other (comment);Reading    Currently in Pain?  Yes    Pain Score  4     Pain Location  Back    Pain Orientation  Mid;Lower    Pain Descriptors / Indicators  Aching    Pain Type  Chronic pain    Pain Onset  More than a month ago             Ther-ex    Supine: Half foam roller robber stretch 60 seconds x 3 trials: minimal cues needed for proper alignment during stretch Half foam roller 3x10 ER RTB with core bracing  Posterior pelvic tilt 5 second  holds x 15; LTR 10x5s holds with tactile cues with good carry over with repetition  Open book stretch 3x 60 seconds each direction  Seated: Single arm rows GTB x 10; Verbal cues to maintain contraction. Pball (green) forward rollout on table 5s hold x 10, at angle 5s hold x 10 each direction. Cues to posteriorly rotate pelvis when returning to neutral position for segmental mobility and reduction of symptoms.    Manual Therapy   Thoracic and lumbar mobilizations  CPAs and UPAs grade III/IV for pain relief and increased mobility. Completed until tissue change or reduction in symptoms.   Prone STM with roller to thoracic and lumbarparaspinals L and R; multiple adhesions throughout paraspinal musculature with pain relief reported.   Prone hip flexor stretch 2x60 seconds each LE with towel under legs.                   PT Education - 10/06/18 0919    Education provided  Yes    Education Details  exercise technique, HEP    Person(s) Educated  Patient    Methods  Explanation;Demonstration;Verbal cues    Comprehension  Verbalized understanding;Returned demonstration       PT Short Term Goals - 09/14/18 1147      PT SHORT TERM GOAL #1   Title  Patient will be independent in home exercise program to improve strength/mobility for better functional independence with ADLs.    Baseline  HEP compliance    Time  2    Period  Weeks    Status  Achieved      PT SHORT TERM GOAL #2   Title  Patient will report a worst pain of 5/10 on VAS in neck to improve tolerance with ADLs and reduced symptoms with activities.     Baseline  3/4: worst pain 8/10; 4/3: 6/10 4/30: 7/10 tension headache 5/30 7/10 when lifting husband from floor; 6/27: 5/10     Time  2    Period  Weeks    Status  Achieved      PT SHORT TERM GOAL #3   Title  Patient will report a worst pain of 5/10 on VAS in thoracic spine to improve tolerance with ADLs and reduced symptoms with activities.     Baseline  7/3: 8/10  7/26: 6/10 8/22: 5/10 9/26:  6/10     Time  2    Period  Weeks    Status  Partially Met        PT Long Term Goals - 09/14/18 1150  PT LONG TERM GOAL #1   Title  Patient will report a worst pain of 3/10 on VAS in cervical spine to improve tolerance with ADLs and reduced symptoms with activities    Baseline  worst pain 8/10; 4/3: 6/10 4/30: 7/10 tension headache from caregiver. 5/30: 7/10 from lifting husband; 6/27: 5/10; 7/26: 6/10 from stress 8/22: 1/10    Time  4    Period  Weeks    Status  Achieved      PT LONG TERM GOAL #2   Title  Patient will have no tingling in bilateral hands for two consecutive weeks to improve quality of life.     Baseline   4/3: had no tingling for about a week, came back this week 3x due to husband in the hospital. 4/30: 2x a week. 5/30: no tingling.     Time  4    Period  Weeks    Status  Achieved      PT LONG TERM GOAL #3   Title  Patient will decrease NDI score to 28% to decrease level of perceived disability to minimal.     Baseline  3/4: 46%; 4/3: 44%: 4/30: 54% 5/30: 40% 6/27: 32% 8/22: 22%    Time  4    Period  Weeks    Status  Achieved      PT LONG TERM GOAL #4   Title  Patient will improve cervical rotation ROM to 60 degrees bilaterally to improve functional mobility.     Baseline  3/4: R 39 L 41; 4/3: R 42 , L 46 4/30: supine L55 R 54 ; 5/30: seated L 55 R 56; 6/27: L 60 , R 56; 7/26: >60     Time  4    Period  Weeks    Status  Achieved      PT LONG TERM GOAL #5   Title  Patient will have reduced occurances of high levels of pain to improve quality of life    Baseline  initially was everyday now is 2x/week; 6/27: 2x/week ; 7/26: 2x/week 8/22: 2-3x/ week 9/26: 3x/week     Time  4    Period  Weeks    Status  Partially Met    Target Date  10/12/18      Additional Long Term Goals   Additional Long Term Goals  Yes      PT LONG TERM GOAL #6   Title  Patient will report a worst pain of 3/10 on VAS in thoracic spine to improve  tolerance with ADLs and reduced symptoms with activities.     Baseline  7/3: 8/10 7/26: 6/10  8/22: 5/10 9/26: 6/10     Time  4    Period  Weeks    Status  Partially Met    Target Date  10/12/18      PT LONG TERM GOAL #7   Title  Patient will improve thoracic mobility to allow patient to turn in seat when driving to see behind her for increased safety with driving.     Baseline  7/3: unable to perform ; 7/26: able to perform 8/22: R limited, L functional 9/26: slightly hypmobile both directions    Time  4    Period  Weeks    Status  Partially Met    Target Date  10/12/18      PT LONG TERM GOAL #8   Title  Patient will reduce modified Oswestry score to <20 as to demonstrate minimal disability  with ADLs including improved sleeping tolerance, walking/sitting tolerance etc for better mobility with ADLs.     Baseline  7/26: 54%; 8/22: 52% 9/26: 60%    Time  4    Period  Weeks    Status  Partially Met    Target Date  10/12/18      PT LONG TERM GOAL  #9   TITLE  Patient will report a worst pain of 3/10 on VAS in lumbar spine to improve tolerance with ADLs and reduced symptoms with activities.     Baseline  9/26: 7/10     Time  4    Period  Weeks    Status  New    Target Date  10/12/18            Plan - 10/06/18 1033    Clinical Impression Statement  Patient presents to therapy with acute flair up of low back pain due to helping husband get dressed. Completed manual therapy including soft tissue mobilization and joint mobilization with a reduction of pain following intervention (4/10-1.5/10). Supplemented manual therapy with mobility based and strengthening exercises including open books and half foam roller stretches. Subjective report of pain less than 1 at end of session. Patient will continue to benefit from skilled physical therapy to improve strength, posture, and reduction of pain levels with ADLs.    Rehab Potential  Fair    PT Frequency  2x / week    PT Duration  4 weeks     PT Treatment/Interventions  ADLs/Self Care Home Management;Cryotherapy;Traction;Ultrasound;Therapeutic exercise;Therapeutic activities;Neuromuscular re-education;Patient/family education;Manual techniques;Passive range of motion;Dry needling;Energy conservation;Visual/perceptual remediation/compensation;Moist Heat;Iontophoresis '4mg'$ /ml Dexamethasone;Aquatic Therapy    PT Next Visit Plan  nerve glides, muscle tissue length, posture    PT Home Exercise Plan  HEP administered    Consulted and Agree with Plan of Care  Patient       Patient will benefit from skilled therapeutic intervention in order to improve the following deficits and impairments:  Decreased activity tolerance, Decreased safety awareness, Decreased range of motion, Decreased mobility, Decreased strength, Impaired flexibility, Postural dysfunction, Improper body mechanics, Pain, Hypomobility, Increased fascial restricitons, Increased muscle spasms  Visit Diagnosis: Pain in thoracic spine  Abnormal posture     Problem List Patient Active Problem List   Diagnosis Date Noted  . Cough 09/08/2017  . Type 1 dissection of ascending aorta ([place]) 04/23/2017  . HPV (human papilloma virus) infection 12/01/2016  . Screening mammogram, encounter for 11/29/2016  . Estrogen deficiency 11/29/2016  . Encounter for routine gynecological examination 11/29/2016  . Grade III hemorrhoids 10/06/2016  . Tachycardia 08/14/2016  . Hemorrhoids 08/13/2016  . Atypical migraine 08/03/2016  . CAD (coronary artery disease)   . AAA (abdominal aortic aneurysm) ([place])   . Chronic combined systolic (congestive) and diastolic (congestive) heart failure ([place])   . Hyperglycemia 02/11/2016  . Generalized anxiety disorder 12/08/2015  . Panic attacks 07/28/2015  . Sensorineural hearing loss 03/27/2013  . Vitamin D deficiency 07/03/2012  . H/O aortic dissection 05/12/2012  . Obesity (BMI 30.0-34.9) 11/21/2011  . Routine general medical examination at a  health care facility 09/20/2011  . S/P AVR (aortic valve replacement) 01/07/2011  . CORONARY ARTERY BYPASS GRAFT, HX OF 01/07/2011  . Arthritis 12/20/2010  . H/O dissecting abdominal aortic aneurysm repair 08/24/2010  . IRRITABLE BOWEL SYNDROME 09/09/2009  . Obstructive sleep apnea 08/04/2009  . External hemorrhoid 06/26/2009  . Osteoporosis 01/09/2009  . Hyperlipidemia 07/26/2007  . Depression with anxiety 07/26/2007  . Essential hypertension 07/26/2007  .  Allergic rhinitis 07/26/2007  . Asthma 07/26/2007  . GERD 07/26/2007  . Osteoarthritis 07/26/2007  . Ocular migraine 07/26/2007  . Mild intermittent asthma without complication 09/92/7800   Erick Blinks, SPT  This entire session was performed under direct supervision and direction of a licensed therapist/therapist assistant . I have personally read, edited and approve of the note as written.  Janna Arch, PT, DPT   10/06/2018, 11:52 AM  [place] [facility] SERVICES [address] [place], [place], [address] Phone: [phone]   Fax:  [phone]  Name: Rachel Torres MRN: [id number] Date of Birth: Sep 16, 1956

## 2018-10-09 ENCOUNTER — Ambulatory Visit: Payer: Medicare HMO | Admitting: Psychology

## 2018-10-10 ENCOUNTER — Ambulatory Visit: Payer: Medicare HMO

## 2018-10-10 DIAGNOSIS — M5412 Radiculopathy, cervical region: Secondary | ICD-10-CM | POA: Diagnosis not present

## 2018-10-10 DIAGNOSIS — R293 Abnormal posture: Secondary | ICD-10-CM

## 2018-10-10 DIAGNOSIS — M546 Pain in thoracic spine: Secondary | ICD-10-CM

## 2018-10-10 DIAGNOSIS — M545 Low back pain, unspecified: Secondary | ICD-10-CM

## 2018-10-10 DIAGNOSIS — G8929 Other chronic pain: Secondary | ICD-10-CM | POA: Diagnosis not present

## 2018-10-10 DIAGNOSIS — M542 Cervicalgia: Secondary | ICD-10-CM | POA: Diagnosis not present

## 2018-10-10 NOTE — Therapy (Signed)
[place] MAIN [facility] SERVICES [address] [place], [place], [address] Phone: [phone]   Fax:  [phone]  Physical Therapy Treatment  Patient Details  Name: Rachel Torres MRN: [id number] Date of Birth: 1956-07-27 Referring Provider (PT): [facility]   Encounter Date: 10/10/2018  PT End of Session - 10/10/18 0934    Visit Number  44    Number of Visits  46    Date for PT Re-Evaluation  10/12/18    Authorization Type  7/10 PN start 9/26     PT Start Time  0930    PT Stop Time  1014    PT Time Calculation (min)  44 min    Activity Tolerance  Patient tolerated treatment well    Behavior During Therapy  [facility] for tasks assessed/performed       Past Medical History:  Diagnosis Date  . AAA (abdominal aortic aneurysm) ([place])    a. Chronic w/o evidence of aneurysmal dil on CTA 01/2016.  Marland Kitchen Allergy   . Anemia   . Anxiety   . Asthma   . CAD (coronary artery disease)    a. 08/2010 s/p CABG x 1 (VG->RCA) @ time of Ao dissection repair; b. 01/2016 Lexiscan MV: mid antsept/apical defect w/ ? peri-infarct ischemia-->likely attenuation-->Med Rx.  . Chronic combined systolic (congestive) and diastolic (congestive) heart failure ([place])    a. 2012 EF 30-35%; b. 04/2015 EF 40-45%; c. 01/2016 Echo: Ef 55-65%, nl AoV bioprosthesis, nl RV, nl PASP.  Marland Kitchen Depression   . Gallbladder sludge   . GERD (gastroesophageal reflux disease)   . History of Bicuspid Aortic Valve    a. 08/2010 s/p AVR @ time of Ao dissection repair; b. 01/2016 Echo: Ef 55-65%, nl AoV bioprosthesis, nl RV, nl PASP.  Marland Kitchen Hx of repair of dissecting thoracic aortic aneurysm, Stanford type A    a. 08/2010 s/p repair with AVR and VG->RCA; b. 01/2016 CTA: stable appearance of Asc Thoracic Aortic graft. Opacification of flase lumen of chronic abd Ao dissection w/ retrograde flow through lumbar arteries. No aneurysmal dil of flase lumen.  . Hyperlipidemia   . Hypertensive heart disease   . IBS  (irritable bowel syndrome)   . Internal hemorrhoids   . Marfan syndrome    pt denies  . Migraine   . Obstructive sleep apnea   . Osteoporosis   . Pneumonia   . RA (rheumatoid arthritis) ([place])    a. Followed by Dr. [name]    Past Surgical History:  Procedure Laterality Date  . ABDOMINAL AORTIC ANEURYSM REPAIR    . CARDIAC CATHETERIZATION  2001  . [place]  . CHOLECYSTECTOMY    . COLONOSCOPY    . FRACTURE SURGERY Right    shoulder replacement  . HEMORRHOID BANDING    . ORIF DISTAL RADIUS FRACTURE Left   . UPPER GASTROINTESTINAL ENDOSCOPY      There were no vitals filed for this visit.  Subjective Assessment - 10/10/18 0933    Subjective  Patient reports her pain was severely exacerbated the past 3 days rating pain 8/10. Not sure why it was increased, aide never showed up. Pain has improved today.     Pertinent History  Patient is a pleasant 62 year old female who presents with cervical neck pain with radiating symptoms to bilateral fingers. 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  Has high stress levels due to caregiver support. Patient has  gained 30 lb.  Has AAA and numbness began seven years ago when aneurysm occured. Sleeping with a neck pillow to relieve pain. Pain has been occurring for years.     Limitations  Sitting;Lifting;Standing;Walking;Writing;House hold activities;Other (comment);Reading    Currently in Pain?  Yes    Pain Score  2     Pain Location  Back    Pain Orientation  Mid;Lower    Pain Descriptors / Indicators  Aching    Pain Type  Chronic pain    Pain Onset  More than a month ago    Pain Frequency  Constant       UBE x 3 minutes backwards Lvl 4    Supine: Nerve glides: SLR with leg on PT shoulder 20x, L and R LE Half foam roller 2x15 ER RTB Posterior pelvic tilt 5 second holds x 15;  Prayer stretch 3x 30 seconds    Standing:  Use of lacrosse ball on wall in standing to decrease  muscle tension at home.   Seated: Single arm Rows GTB 2 x 10; Pball (green) forward rollout on table 5s hold x 10, at angle 5s hold x 10 each direction      Manual Therapy  Prone STM with roller to thoracic and lumbar paraspinals L and R; multiple adhesions throughout paraspinal musculature with pain relief reported.    Hip flexor stretch 60 seconds each leg   Thoracic and lumbar mobilizations  CPAs and UPAs ; 5-8 seconds per level x 4 sets                    PT Education - 10/10/18 0934    Education provided  Yes    Education Details  exercise technique, HEP, manual     Person(s) Educated  Patient    Methods  Explanation;Demonstration;Verbal cues    Comprehension  Verbalized understanding;Returned demonstration       PT Short Term Goals - 09/14/18 1147      PT SHORT TERM GOAL #1   Title  Patient will be independent in home exercise program to improve strength/mobility for better functional independence with ADLs.    Baseline  HEP compliance    Time  2    Period  Weeks    Status  Achieved      PT SHORT TERM GOAL #2   Title  Patient will report a worst pain of 5/10 on VAS in neck to improve tolerance with ADLs and reduced symptoms with activities.     Baseline  3/4: worst pain 8/10; 4/3: 6/10 4/30: 7/10 tension headache 5/30 7/10 when lifting husband from floor; 6/27: 5/10     Time  2    Period  Weeks    Status  Achieved      PT SHORT TERM GOAL #3   Title  Patient will report a worst pain of 5/10 on VAS in thoracic spine to improve tolerance with ADLs and reduced symptoms with activities.     Baseline  7/3: 8/10 7/26: 6/10 8/22: 5/10 9/26:  6/10     Time  2    Period  Weeks    Status  Partially Met        PT Long Term Goals - 09/14/18 1150      PT LONG TERM GOAL #1   Title  Patient will report a worst pain of 3/10 on VAS in cervical spine to improve tolerance with ADLs and reduced symptoms with activities    Baseline  worst pain 8/10; 4/3: 6/10  4/30: 7/10 tension headache from caregiver. 5/30: 7/10 from lifting husband; 6/27: 5/10; 7/26: 6/10 from stress 8/22: 1/10    Time  4    Period  Weeks    Status  Achieved      PT LONG TERM GOAL #2   Title  Patient will have no tingling in bilateral hands for two consecutive weeks to improve quality of life.     Baseline   4/3: had no tingling for about a week, came back this week 3x due to husband in the hospital. 4/30: 2x a week. 5/30: no tingling.     Time  4    Period  Weeks    Status  Achieved      PT LONG TERM GOAL #3   Title  Patient will decrease NDI score to 28% to decrease level of perceived disability to minimal.     Baseline  3/4: 46%; 4/3: 44%: 4/30: 54% 5/30: 40% 6/27: 32% 8/22: 22%    Time  4    Period  Weeks    Status  Achieved      PT LONG TERM GOAL #4   Title  Patient will improve cervical rotation ROM to 60 degrees bilaterally to improve functional mobility.     Baseline  3/4: R 39 L 41; 4/3: R 42 , L 46 4/30: supine L55 R 54 ; 5/30: seated L 55 R 56; 6/27: L 60 , R 56; 7/26: >60     Time  4    Period  Weeks    Status  Achieved      PT LONG TERM GOAL #5   Title  Patient will have reduced occurances of high levels of pain to improve quality of life    Baseline  initially was everyday now is 2x/week; 6/27: 2x/week ; 7/26: 2x/week 8/22: 2-3x/ week 9/26: 3x/week     Time  4    Period  Weeks    Status  Partially Met    Target Date  10/12/18      Additional Long Term Goals   Additional Long Term Goals  Yes      PT LONG TERM GOAL #6   Title  Patient will report a worst pain of 3/10 on VAS in thoracic spine to improve tolerance with ADLs and reduced symptoms with activities.     Baseline  7/3: 8/10 7/26: 6/10  8/22: 5/10 9/26: 6/10     Time  4    Period  Weeks    Status  Partially Met    Target Date  10/12/18      PT LONG TERM GOAL #7   Title  Patient will improve thoracic mobility to allow patient to turn in seat when driving to see behind her for increased  safety with driving.     Baseline  7/3: unable to perform ; 7/26: able to perform 8/22: R limited, L functional 9/26: slightly hypmobile both directions    Time  4    Period  Weeks    Status  Partially Met    Target Date  10/12/18      PT LONG TERM GOAL #8   Title  Patient will reduce modified Oswestry score to <20 as to demonstrate minimal disability with ADLs including improved sleeping tolerance, walking/sitting tolerance etc for better mobility with ADLs.     Baseline  7/26: 54%; 8/22: 52% 9/26: 60%    Time  4    Period  Weeks    Status  Partially Met    Target Date  10/12/18      PT LONG TERM GOAL  #9   TITLE  Patient will report a worst pain of 3/10 on VAS in lumbar spine to improve tolerance with ADLs and reduced symptoms with activities.     Baseline  9/26: 7/10     Time  4    Period  Weeks    Status  New    Target Date  10/12/18            Plan - 10/10/18 1005    Clinical Impression Statement  Due to patient's flare up, goals will be re-assessed next session rather than this session. Patient's stiffness and pain significantly reduced by end of session per patient report. Patient demonstrated independent use of lacrosse ball on wall for home STM. Patient will continue to benefit from skilled physical therapy to improve strength, posture, and reduction of pain levels with ADLs.    Rehab Potential  Fair    PT Frequency  2x / week    PT Duration  4 weeks    PT Treatment/Interventions  ADLs/Self Care Home Management;Cryotherapy;Traction;Ultrasound;Therapeutic exercise;Therapeutic activities;Neuromuscular re-education;Patient/family education;Manual techniques;Passive range of motion;Dry needling;Energy conservation;Visual/perceptual remediation/compensation;Moist Heat;Iontophoresis 57m/ml Dexamethasone;Aquatic Therapy    PT Next Visit Plan  nerve glides, muscle tissue length, posture    PT Home Exercise Plan  HEP administered    Consulted and Agree with Plan of Care  Patient        Patient will benefit from skilled therapeutic intervention in order to improve the following deficits and impairments:  Decreased activity tolerance, Decreased safety awareness, Decreased range of motion, Decreased mobility, Decreased strength, Impaired flexibility, Postural dysfunction, Improper body mechanics, Pain, Hypomobility, Increased fascial restricitons, Increased muscle spasms  Visit Diagnosis: Pain in thoracic spine  Abnormal posture  Chronic midline low back pain, unspecified whether sciatica present     Problem List Patient Active Problem List   Diagnosis Date Noted  . Cough 09/08/2017  . Type 1 dissection of ascending aorta ([place] 04/23/2017  . HPV (human papilloma virus) infection 12/01/2016  . Screening mammogram, encounter for 11/29/2016  . Estrogen deficiency 11/29/2016  . Encounter for routine gynecological examination 11/29/2016  . Grade III hemorrhoids 10/06/2016  . Tachycardia 08/14/2016  . Hemorrhoids 08/13/2016  . Atypical migraine 08/03/2016  . CAD (coronary artery disease)   . AAA (abdominal aortic aneurysm) ([place]   . Chronic combined systolic (congestive) and diastolic (congestive) heart failure ([place]   . Hyperglycemia 02/11/2016  . Generalized anxiety disorder 12/08/2015  . Panic attacks 07/28/2015  . Sensorineural hearing loss 03/27/2013  . Vitamin D deficiency 07/03/2012  . H/O aortic dissection 05/12/2012  . Obesity (BMI 30.0-34.9) 11/21/2011  . Routine general medical examination at a health care facility 09/20/2011  . S/P AVR (aortic valve replacement) 01/07/2011  . CORONARY ARTERY BYPASS GRAFT, HX OF 01/07/2011  . Arthritis 12/20/2010  . H/O dissecting abdominal aortic aneurysm repair 08/24/2010  . IRRITABLE BOWEL SYNDROME 09/09/2009  . Obstructive sleep apnea 08/04/2009  . External hemorrhoid 06/26/2009  . Osteoporosis 01/09/2009  . Hyperlipidemia 07/26/2007  . Depression with anxiety 07/26/2007  . Essential hypertension  07/26/2007  . Allergic rhinitis 07/26/2007  . Asthma 07/26/2007  . GERD 07/26/2007  . Osteoarthritis 07/26/2007  . Ocular migraine 07/26/2007  . Mild intermittent asthma without complication 001/75/1025  MJanna Arch PT, DPT   10/10/2018, 10:16 AM  [place]  [place], [place], [address] Phone: [phone]   Fax:  [phone]  Name: Rachel Torres MRN: [id number] Date of Birth: Nov 06, 1956

## 2018-10-13 ENCOUNTER — Ambulatory Visit: Payer: Medicare HMO

## 2018-10-13 DIAGNOSIS — R293 Abnormal posture: Secondary | ICD-10-CM

## 2018-10-13 DIAGNOSIS — M546 Pain in thoracic spine: Secondary | ICD-10-CM

## 2018-10-13 DIAGNOSIS — M545 Low back pain, unspecified: Secondary | ICD-10-CM

## 2018-10-13 DIAGNOSIS — M5412 Radiculopathy, cervical region: Secondary | ICD-10-CM | POA: Diagnosis not present

## 2018-10-13 DIAGNOSIS — G8929 Other chronic pain: Secondary | ICD-10-CM

## 2018-10-13 DIAGNOSIS — M542 Cervicalgia: Secondary | ICD-10-CM | POA: Diagnosis not present

## 2018-10-13 NOTE — Therapy (Addendum)
[facility] [address] [place], [place], [address] Phone: [phone]   Fax:  [phone]  Physical Therapy Treatment Physical Therapy Progress Note   Dates of reporting period  09/14/18   to   10/13/18   Patient Details  Name: Rachel Torres MRN: [id number] Date of Birth: 09/12/1956 Referring Provider (PT): henry pool   Encounter Date: 10/13/2018  PT End of Session - 10/13/18 1025    Visit Number  45    Number of Visits  54    Date for PT Re-Evaluation  11/10/18    Authorization Type  1/10 PN start 10/25    PT Start Time  0930    PT Stop Time  1015    PT Time Calculation (min)  45 min    Activity Tolerance  Patient tolerated treatment well    Behavior During Therapy  WFL for tasks assessed/performed       Past Medical History:  Diagnosis Date  . AAA (abdominal aortic aneurysm) (HCC)    a. Chronic w/o evidence of aneurysmal dil on CTA 01/2016.  . Allergy   . Anemia   . Anxiety   . Asthma   . CAD (coronary artery disease)    a. 08/2010 s/p CABG x 1 (VG->RCA) @ time of Ao dissection repair; b. 01/2016 Lexiscan MV: mid antsept/apical defect w/ ? peri-infarct ischemia-->likely attenuation-->Med Rx.  . Chronic combined systolic (congestive) and diastolic (congestive) heart failure (HCC)    a. 2012 EF 30-35%; b. 04/2015 EF 40-45%; c. 01/2016 Echo: Ef 55-65%, nl AoV bioprosthesis, nl RV, nl PASP.  . Depression   . Gallbladder sludge   . GERD (gastroesophageal reflux disease)   . History of Bicuspid Aortic Valve    a. 08/2010 s/p AVR @ time of Ao dissection repair; b. 01/2016 Echo: Ef 55-65%, nl AoV bioprosthesis, nl RV, nl PASP.  . Hx of repair of dissecting thoracic aortic aneurysm, Stanford type A    a. 08/2010 s/p repair with AVR and VG->RCA; b. 01/2016 CTA: stable appearance of Asc Thoracic Aortic graft. Opacification of flase lumen of chronic abd Ao dissection w/ retrograde flow through lumbar arteries. No aneurysmal  dil of flase lumen.  . Hyperlipidemia   . Hypertensive heart disease   . IBS (irritable bowel syndrome)   . Internal hemorrhoids   . Marfan syndrome    pt denies  . Migraine   . Obstructive sleep apnea   . Osteoporosis   . Pneumonia   . RA (rheumatoid arthritis) (HCC)    a. Followed by Dr. Beakman    Past Surgical History:  Procedure Laterality Date  . ABDOMINAL AORTIC ANEURYSM REPAIR    . CARDIAC CATHETERIZATION  2001  . CESAREAN SECTION  1986 & 1991  . CHOLECYSTECTOMY    . COLONOSCOPY    . FRACTURE SURGERY Right    shoulder replacement  . HEMORRHOID BANDING    . ORIF DISTAL RADIUS FRACTURE Left   . UPPER GASTROINTESTINAL ENDOSCOPY      There were no vitals filed for this visit.  Subjective Assessment - 10/13/18 0934    Subjective  Patient reports her pain level is much better today. States her mom spent the first night at the house last night. Patient states R shoulder has began to have a painful pop when she completes overhead activities. Reports she has not had the time she has wanted to complete exercises this week due to her mom moving in.       Pertinent History  Patient is a pleasant 61 year old female who presents with cervical neck pain with radiating symptoms to bilateral fingers. 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  Has high stress levels due to caregiver support. Patient has gained 30 lb.  Has AAA and numbness began seven years ago when aneurysm occured. Sleeping with a neck pillow to relieve pain. Pain has been occurring for years.     Limitations  Sitting;Lifting;Standing;Walking;Writing;House hold activities;Other (comment);Reading    Currently in Pain?  Yes    Pain Score  1     Pain Orientation  Mid;Lower    Pain Descriptors / Indicators  Aching    Pain Type  Chronic pain    Pain Onset  More than a month ago     Updates goals worse pain thoracic spine- 4/10 MODI- 42% Worse pain lumbar spine- 6/10    UBE x 2  minutes backwards; 2 minutes forward Lvl 4    Supine: Half foam roller 2x15 ER RTB Posterior pelvic tilt 5 second holds x 15;  Open books x10 each direction 5 second hold  Thoracic extension seated over foam roller x10 with 5s holds, tactile cues for neck posture        Seated: Single arm Rows GTB x 10 BUE; Pball (green) forward rollout on table 5s hold x 10, at angle 5s hold x 10 each direction      Manual Therapy  Prone STM with roller to thoracic and lumbar paraspinals L and R; multiple adhesions throughout paraspinal musculature with pain relief reported.    Seated STM to thoracic paraspinals to reduce muscle spasm following attempt to do hip flexor stretch  Thoracic and lumbar mobilizations  CPAs and UPAs ; 5-8 seconds per level x 4 sets   Patient's condition has the potential to improve in response to therapy. Maximum improvement is yet to be obtained. The anticipated improvement is attainable and reasonable in a generally predictable time.  Patient reports decrease in pain following therapy and completion of exercises increasing her ability to complete ADLs.                    PT Education - 10/13/18 1034    Education provided  Yes    Education Details  exercise technique, HEP, postural exercises    Person(s) Educated  Patient    Methods  Explanation;Demonstration;Verbal cues    Comprehension  Verbalized understanding;Returned demonstration       PT Short Term Goals - 10/13/18 0937      PT SHORT TERM GOAL #1   Title  Patient will be independent in home exercise program to improve strength/mobility for better functional independence with ADLs.    Baseline  HEP compliance    Time  2    Period  Weeks    Status  Achieved      PT SHORT TERM GOAL #2   Title  Patient will report a worst pain of 5/10 on VAS in neck to improve tolerance with ADLs and reduced symptoms with activities.     Baseline  3/4: worst pain 8/10; 4/3: 6/10 4/30: 7/10 tension headache  5/30 7/10 when lifting husband from floor; 6/27: 5/10     Time  2    Period  Weeks    Status  Achieved      PT SHORT TERM GOAL #3   Title  Patient will report a worst pain of 5/10 on VAS in thoracic   spine to improve tolerance with ADLs and reduced symptoms with activities.     Baseline  7/3: 8/10 7/26: 6/10 8/22: 5/10 9/26:  6/10 10/25: 4/10    Time  2    Period  Weeks    Status  Achieved        PT Long Term Goals - 10/13/18 0938      PT LONG TERM GOAL #1   Title  Patient will report a worst pain of 3/10 on VAS in cervical spine to improve tolerance with ADLs and reduced symptoms with activities    Baseline  worst pain 8/10; 4/3: 6/10 4/30: 7/10 tension headache from caregiver. 5/30: 7/10 from lifting husband; 6/27: 5/10; 7/26: 6/10 from stress 8/22: 1/10    Time  4    Period  Weeks    Status  Achieved      PT LONG TERM GOAL #2   Title  Patient will have no tingling in bilateral hands for two consecutive weeks to improve quality of life.     Baseline   4/3: had no tingling for about a week, came back this week 3x due to husband in the hospital. 4/30: 2x a week. 5/30: no tingling.     Time  4    Period  Weeks    Status  Achieved      PT LONG TERM GOAL #3   Title  Patient will decrease NDI score to 28% to decrease level of perceived disability to minimal.     Baseline  3/4: 46%; 4/3: 44%: 4/30: 54% 5/30: 40% 6/27: 32% 8/22: 22%    Time  4    Period  Weeks    Status  Achieved      PT LONG TERM GOAL #4   Title  Patient will improve cervical rotation ROM to 60 degrees bilaterally to improve functional mobility.     Baseline  3/4: R 39 L 41; 4/3: R 42 , L 46 4/30: supine L55 R 54 ; 5/30: seated L 55 R 56; 6/27: L 60 , R 56; 7/26: >60     Time  4    Period  Weeks    Status  Achieved      PT LONG TERM GOAL #5   Title  Patient will have reduced occurances of high levels of pain to improve quality of life    Baseline  initially was everyday now is 2x/week; 6/27: 2x/week ; 7/26:  2x/week 8/22: 2-3x/ week 9/26: 3x/week  10/25: 1x a day due to moving mom in and caregiver duties    Time  4    Period  Weeks    Status  Partially Met    Target Date  11/10/18      PT LONG TERM GOAL #6   Title  Patient will report a worst pain of 3/10 on VAS in thoracic spine to improve tolerance with ADLs and reduced symptoms with activities.     Baseline  7/3: 8/10 7/26: 6/10  8/22: 5/10 9/26: 6/10 10/25: 6/10    Time  4    Period  Weeks    Status  Partially Met    Target Date  11/10/18      PT LONG TERM GOAL #7   Title  Patient will improve thoracic mobility to allow patient to turn in seat when driving to see behind her for increased safety with driving.     Baseline  7/3: unable to perform ; 7/26: able to perform 8/22: R   limited, L functional 9/26: slightly hypmobile both directions 10/25: slightly hypomobile both directions    Time  4    Period  Weeks    Status  Partially Met    Target Date  11/10/18      PT LONG TERM GOAL #8   Title  Patient will reduce modified Oswestry score to <20 as to demonstrate minimal disability with ADLs including improved sleeping tolerance, walking/sitting tolerance etc for better mobility with ADLs.     Baseline  7/26: 54%; 8/22: 52% 9/26: 60% 10/25: 42%    Time  4    Period  Weeks    Status  Partially Met    Target Date  11/10/18      PT LONG TERM GOAL  #9   TITLE  Patient will report a worst pain of 3/10 on VAS in lumbar spine to improve tolerance with ADLs and reduced symptoms with activities.     Baseline  9/26: 7/10  10/25: 6/10    Time  4    Period  Weeks    Status  Partially Met    Target Date  11/10/18            Plan - 10/13/18 1033    Clinical Impression Statement  Patient presents with lower pain levels and less muscle guarding and tension then previous session. Goals were updated with improvement in thoracic pain levels with worse pain score of 4/10 on VAS reported. Patients progress and goals were impacted today due to  patient moving mom into her home and increased lifting/activity completed this week. Patient also is primary caregiver of husband increasing time and duration and progress due to demands placed on thoracic and low back. Patient's condition has the potential to improve in response to therapy. Maximum improvement is yet to be obtained. The anticipated improvement is attainable and reasonable in a generally predictable time. Patient will continue to benefit from skilled physical therapy to improve strength, posture, and reduction of pain levels with ADLs.     Rehab Potential  Fair    PT Frequency  2x / week    PT Duration  4 weeks    PT Treatment/Interventions  ADLs/Self Care Home Management;Cryotherapy;Traction;Ultrasound;Therapeutic exercise;Therapeutic activities;Neuromuscular re-education;Patient/family education;Manual techniques;Passive range of motion;Dry needling;Energy conservation;Visual/perceptual remediation/compensation;Moist Heat;Iontophoresis 38m/ml Dexamethasone;Aquatic Therapy    PT Next Visit Plan  nerve glides, muscle tissue length, posture    PT Home Exercise Plan  HEP administered    Consulted and Agree with Plan of Care  Patient       Patient will benefit from skilled therapeutic intervention in order to improve the following deficits and impairments:  Decreased activity tolerance, Decreased safety awareness, Decreased range of motion, Decreased mobility, Decreased strength, Impaired flexibility, Postural dysfunction, Improper body mechanics, Pain, Hypomobility, Increased fascial restricitons, Increased muscle spasms  Visit Diagnosis: Pain in thoracic spine  Abnormal posture  Chronic midline low back pain, unspecified whether sciatica present     Problem List Patient Active Problem List   Diagnosis Date Noted  . Cough 09/08/2017  . Type 1 dissection of ascending aorta ([place] 04/23/2017  . HPV (human papilloma virus) infection 12/01/2016  . Screening mammogram, encounter  for 11/29/2016  . Estrogen deficiency 11/29/2016  . Encounter for routine gynecological examination 11/29/2016  . Grade III hemorrhoids 10/06/2016  . Tachycardia 08/14/2016  . Hemorrhoids 08/13/2016  . Atypical migraine 08/03/2016  . CAD (coronary artery disease)   . AAA [place]dominal aortic aneurysm) ([facility]   . Chronic combined systolic (congestive)  and diastolic (congestive) heart failure (HCC)   . Hyperglycemia 02/11/2016  . Generalized anxiety disorder 12/08/2015  . Panic attacks 07/28/2015  . Sensorineural hearing loss 03/27/2013  . Vitamin D deficiency 07/03/2012  . H/O aortic dissection 05/12/2012  . Obesity (BMI 30.0-34.9) 11/21/2011  . Routine general medical examination at a health care facility 09/20/2011  . S/P AVR (aortic valve replacement) 01/07/2011  . CORONARY ARTERY BYPASS GRAFT, HX OF 01/07/2011  . Arthritis 12/20/2010  . H/O dissecting abdominal aortic aneurysm repair 08/24/2010  . IRRITABLE BOWEL SYNDROME 09/09/2009  . Obstructive sleep apnea 08/04/2009  . External hemorrhoid 06/26/2009  . Osteoporosis 01/09/2009  . Hyperlipidemia 07/26/2007  . Depression with anxiety 07/26/2007  . Essential hypertension 07/26/2007  . Allergic rhinitis 07/26/2007  . Asthma 07/26/2007  . GERD 07/26/2007  . Osteoarthritis 07/26/2007  . Ocular migraine 07/26/2007  . Mild intermittent asthma without complication 07/26/2007   Rachel Torres, SPT  This entire session was performed under direct supervision and direction of a licensed therapist/therapist assistant . I have personally read, edited and approve of the note as written.  Rachel Torres, PT, DPT   10/13/2018, 11:28 AM  [facility] [address] [place], [place], [address] Phone: [phone]   Fax:  [phone]  Name: Marieliz C Lemmerman MRN: [id number] Date of Birth: 09/23/1956   

## 2018-10-16 ENCOUNTER — Other Ambulatory Visit: Payer: Self-pay | Admitting: Family Medicine

## 2018-10-16 MED ORDER — ALPRAZOLAM 0.5 MG PO TABS: 0.5000 mg | ORAL_TABLET | Freq: Every day | ORAL | 0 refills | Status: DC | PRN

## 2018-10-16 NOTE — Telephone Encounter (Signed)
Name of Medication: xanax Name of [facility]acy: [facility] Last Fill or Written Date and Quantity: 05/02/18 #30 tabs with 0 refills Last Office Visit and Type: knee pain with Dr. Lorelei Pont on 06/09/18 Next Office Visit and Type: none scheduled Last Controlled Substance Agreement Date: none Last [id number]

## 2018-10-17 ENCOUNTER — Ambulatory Visit: Payer: Medicare HMO

## 2018-10-17 DIAGNOSIS — G8929 Other chronic pain: Secondary | ICD-10-CM | POA: Diagnosis not present

## 2018-10-17 DIAGNOSIS — R293 Abnormal posture: Secondary | ICD-10-CM

## 2018-10-17 DIAGNOSIS — M546 Pain in thoracic spine: Secondary | ICD-10-CM

## 2018-10-17 DIAGNOSIS — M545 Low back pain, unspecified: Secondary | ICD-10-CM

## 2018-10-17 DIAGNOSIS — M542 Cervicalgia: Secondary | ICD-10-CM | POA: Diagnosis not present

## 2018-10-17 DIAGNOSIS — M5412 Radiculopathy, cervical region: Secondary | ICD-10-CM | POA: Diagnosis not present

## 2018-10-17 NOTE — Therapy (Signed)
[place] [facility] SERVICES [address] [place], [place], [address] Phone: [phone]   Fax:  [phone]  Physical Therapy Treatment  Patient Details  Name: Rachel Torres MRN: [id number] Date of Birth: July 06, 1956 Referring Provider (PT): [place] [facility]   Encounter Date: 10/17/2018  PT End of Session - 10/17/18 1033    Visit Number  46    Number of Visits  54    Date for PT Re-Evaluation  11/10/18    Authorization Type  2/10 PN start 10/25    PT Start Time  1026    PT Stop Time  1106    PT Time Calculation (min)  40 min    Activity Tolerance  Patient tolerated treatment well    Behavior During Therapy  [facility] for tasks assessed/performed       Past Medical History:  Diagnosis Date  . AAA (abdominal aortic aneurysm) ([place])    a. Chronic w/o evidence of aneurysmal dil on CTA 01/2016.  Marland Kitchen Allergy   . Anemia   . Anxiety   . Asthma   . CAD (coronary artery disease)    a. 08/2010 s/p CABG x 1 (VG->RCA) @ time of Ao dissection repair; b. 01/2016 Lexiscan MV: mid antsept/apical defect w/ ? peri-infarct ischemia-->likely attenuation-->Med Rx.  . Chronic combined systolic (congestive) and diastolic (congestive) heart failure ([place])    a. 2012 EF 30-35%; b. 04/2015 EF 40-45%; c. 01/2016 Echo: Ef 55-65%, nl AoV bioprosthesis, nl RV, nl PASP.  Marland Kitchen Depression   . Gallbladder sludge   . GERD (gastroesophageal reflux disease)   . History of Bicuspid Aortic Valve    a. 08/2010 s/p AVR @ time of Ao dissection repair; b. 01/2016 Echo: Ef 55-65%, nl AoV bioprosthesis, nl RV, nl PASP.  Marland Kitchen Hx of repair of dissecting thoracic aortic aneurysm, Stanford type A    a. 08/2010 s/p repair with AVR and VG->RCA; b. 01/2016 CTA: stable appearance of Asc Thoracic Aortic graft. Opacification of flase lumen of chronic abd Ao dissection w/ retrograde flow through lumbar arteries. No aneurysmal dil of flase lumen.  . Hyperlipidemia   . Hypertensive heart disease   . IBS  (irritable bowel syndrome)   . Internal hemorrhoids   . Marfan syndrome    pt denies  . Migraine   . Obstructive sleep apnea   . Osteoporosis   . Pneumonia   . RA (rheumatoid arthritis) ([place])    a. Followed by Dr. [name]    Past Surgical History:  Procedure Laterality Date  . ABDOMINAL AORTIC ANEURYSM REPAIR    . CARDIAC CATHETERIZATION  2001  . [place]  . CHOLECYSTECTOMY    . COLONOSCOPY    . FRACTURE SURGERY Right    shoulder replacement  . HEMORRHOID BANDING    . ORIF DISTAL RADIUS FRACTURE Left   . UPPER GASTROINTESTINAL ENDOSCOPY      There were no vitals filed for this visit.  Subjective Assessment - 10/17/18 1030    Subjective  Patient's [birth year] is today. Is having severe difficulty caregiving for husband who is starting to have behavioral issues. Patient visibly upset and exhausted.     Pertinent History  Patient is a pleasant 62 year old female who presents with cervical neck pain with radiating symptoms to bilateral fingers. 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  Has high stress levels due to caregiver support. Patient has gained 30 lb.  Has AAA  and numbness began seven years ago when aneurysm occured. Sleeping with a neck pillow to relieve pain. Pain has been occurring for years.     Limitations  Sitting;Lifting;Standing;Walking;Writing;House hold activities;Other (comment);Reading    Currently in Pain?  Yes    Pain Score  3     Pain Location  Back    Pain Orientation  Mid;Lower    Pain Descriptors / Indicators  Aching    Pain Type  Chronic pain    Pain Onset  More than a month ago    Pain Frequency  Constant       UBE x 3 minutes backwards Lvl 4    Supine: Nerve glides: SLR with leg on PT shoulder 20x, L and R LE Half foam roller robber stretch 3x 30 second holds Half foam roller 2x15 ER RTB Posterior pelvic tilt 5 second holds x 15;  Prayer stretch 3x 30 seconds     Seated: Single  arm Rows GTB 2 x 10; Pball (green) forward rollout on table 5s hold x 10, at angle 5s hold x 10 each direction      Manual Therapy  Prone STM with roller to thoracic and lumbar paraspinals L and R; multiple adhesions throughout paraspinal musculature with pain relief reported.      Thoracic and lumbar mobilizations  CPAs and UPAs ; 5-8 seconds per level x 4 sets                        PT Education - 10/17/18 1032    Education provided  Yes    Education Details  exercise technique, manual, posture    Person(s) Educated  Patient    Methods  Explanation;Demonstration;Verbal cues    Comprehension  Verbalized understanding;Returned demonstration       PT Short Term Goals - 10/13/18 0937      PT SHORT TERM GOAL #1   Title  Patient will be independent in home exercise program to improve strength/mobility for better functional independence with ADLs.    Baseline  HEP compliance    Time  2    Period  Weeks    Status  Achieved      PT SHORT TERM GOAL #2   Title  Patient will report a worst pain of 5/10 on VAS in neck to improve tolerance with ADLs and reduced symptoms with activities.     Baseline  3/4: worst pain 8/10; 4/3: 6/10 4/30: 7/10 tension headache 5/30 7/10 when lifting husband from floor; 6/27: 5/10     Time  2    Period  Weeks    Status  Achieved      PT SHORT TERM GOAL #3   Title  Patient will report a worst pain of 5/10 on VAS in thoracic spine to improve tolerance with ADLs and reduced symptoms with activities.     Baseline  7/3: 8/10 7/26: 6/10 8/22: 5/10 9/26:  6/10 10/25: 4/10    Time  2    Period  Weeks    Status  Achieved        PT Long Term Goals - 10/13/18 5701      PT LONG TERM GOAL #1   Title  Patient will report a worst pain of 3/10 on VAS in cervical spine to improve tolerance with ADLs and reduced symptoms with activities    Baseline  worst pain 8/10; 4/3: 6/10 4/30: 7/10 tension headache from caregiver. 5/30: 7/10 from lifting  husband; 6/27:  5/10; 7/26: 6/10 from stress 8/22: 1/10    Time  4    Period  Weeks    Status  Achieved      PT LONG TERM GOAL #2   Title  Patient will have no tingling in bilateral hands for two consecutive weeks to improve quality of life.     Baseline   4/3: had no tingling for about a week, came back this week 3x due to husband in the hospital. 4/30: 2x a week. 5/30: no tingling.     Time  4    Period  Weeks    Status  Achieved      PT LONG TERM GOAL #3   Title  Patient will decrease NDI score to 28% to decrease level of perceived disability to minimal.     Baseline  3/4: 46%; 4/3: 44%: 4/30: 54% 5/30: 40% 6/27: 32% 8/22: 22%    Time  4    Period  Weeks    Status  Achieved      PT LONG TERM GOAL #4   Title  Patient will improve cervical rotation ROM to 60 degrees bilaterally to improve functional mobility.     Baseline  3/4: R 39 L 41; 4/3: R 42 , L 46 4/30: supine L55 R 54 ; 5/30: seated L 55 R 56; 6/27: L 60 , R 56; 7/26: >60     Time  4    Period  Weeks    Status  Achieved      PT LONG TERM GOAL #5   Title  Patient will have reduced occurances of high levels of pain to improve quality of life    Baseline  initially was everyday now is 2x/week; 6/27: 2x/week ; 7/26: 2x/week 8/22: 2-3x/ week 9/26: 3x/week  10/25: 1x a day due to moving mom in and caregiver duties    Time  4    Period  Weeks    Status  Partially Met    Target Date  11/10/18      PT LONG TERM GOAL #6   Title  Patient will report a worst pain of 3/10 on VAS in thoracic spine to improve tolerance with ADLs and reduced symptoms with activities.     Baseline  7/3: 8/10 7/26: 6/10  8/22: 5/10 9/26: 6/10 10/25: 6/10    Time  4    Period  Weeks    Status  Partially Met    Target Date  11/10/18      PT LONG TERM GOAL #7   Title  Patient will improve thoracic mobility to allow patient to turn in seat when driving to see behind her for increased safety with driving.     Baseline  7/3: unable to perform ; 7/26:  able to perform 8/22: R limited, L functional 9/26: slightly hypmobile both directions 10/25: slightly hypomobile both directions    Time  4    Period  Weeks    Status  Partially Met    Target Date  11/10/18      PT LONG TERM GOAL #8   Title  Patient will reduce modified Oswestry score to <20 as to demonstrate minimal disability with ADLs including improved sleeping tolerance, walking/sitting tolerance etc for better mobility with ADLs.     Baseline  7/26: 54%; 8/22: 52% 9/26: 60% 10/25: 42%    Time  4    Period  Weeks    Status  Partially Met    Target Date  11/10/18      PT LONG TERM GOAL  #9   TITLE  Patient will report a worst pain of 3/10 on VAS in lumbar spine to improve tolerance with ADLs and reduced symptoms with activities.     Baseline  9/26: 7/10  10/25: 6/10    Time  4    Period  Weeks    Status  Partially Met    Target Date  11/10/18            Plan - 10/17/18 1054    Clinical Impression Statement  Patient presents with increased tension and stress in thoracic paraspinals resulting in decreased muscle length with decreased postural control. Patient educated on need to share with case manager husbands deficits of cognition resulting in increased caregiver stress. Patient will continue to benefit from skilled physical therapy to improve strength, posture, and reduction of pain levels with ADLs.    Rehab Potential  Fair    PT Frequency  2x / week    PT Duration  4 weeks    PT Treatment/Interventions  ADLs/Self Care Home Management;Cryotherapy;Traction;Ultrasound;Therapeutic exercise;Therapeutic activities;Neuromuscular re-education;Patient/family education;Manual techniques;Passive range of motion;Dry needling;Energy conservation;Visual/perceptual remediation/compensation;Moist Heat;Iontophoresis 67m/ml Dexamethasone;Aquatic Therapy    PT Next Visit Plan  nerve glides, muscle tissue length, posture    PT Home Exercise Plan  HEP administered    Consulted and Agree with  Plan of Care  Patient       Patient will benefit from skilled therapeutic intervention in order to improve the following deficits and impairments:  Decreased activity tolerance, Decreased safety awareness, Decreased range of motion, Decreased mobility, Decreased strength, Impaired flexibility, Postural dysfunction, Improper body mechanics, Pain, Hypomobility, Increased fascial restricitons, Increased muscle spasms  Visit Diagnosis: Pain in thoracic spine  Abnormal posture  Chronic midline low back pain, unspecified whether sciatica present     Problem List Patient Active Problem List   Diagnosis Date Noted  . Cough 09/08/2017  . Type 1 dissection of ascending aorta ([place] 04/23/2017  . HPV (human papilloma virus) infection 12/01/2016  . Screening mammogram, encounter for 11/29/2016  . Estrogen deficiency 11/29/2016  . Encounter for routine gynecological examination 11/29/2016  . Grade III hemorrhoids 10/06/2016  . Tachycardia 08/14/2016  . Hemorrhoids 08/13/2016  . Atypical migraine 08/03/2016  . CAD (coronary artery disease)   . AAA [place]ab[place] aortic aneurysm) ([place]   . Chronic combined systolic (congestive) and diastolic (congestive) heart failure ([place]   . Hyperglycemia 02/11/2016  . Generalized anxiety disorder 12/08/2015  . Panic attacks 07/28/2015  . Sensorineural hearing loss 03/27/2013  . Vitamin D deficiency 07/03/2012  . H/O aortic dissection 05/12/2012  . Obesity (BMI 30.0-34.9) 11/21/2011  . Routine general medical examination at a health care facility 09/20/2011  . S/P AVR (aortic valve replacement) 01/07/2011  . CORONARY ARTERY BYPASS GRAFT, HX OF 01/07/2011  . Arthritis 12/20/2010  . H/O dissecting abdominal aortic aneurysm repair 08/24/2010  . IRRITABLE BOWEL SYNDROME 09/09/2009  . Obstructive sleep apnea 08/04/2009  . External hemorrhoid 06/26/2009  . Osteoporosis 01/09/2009  . Hyperlipidemia 07/26/2007  . Depression with anxiety 07/26/2007  .  Essential hypertension 07/26/2007  . Allergic rhinitis 07/26/2007  . Asthma 07/26/2007  . GERD 07/26/2007  . Osteoarthritis 07/26/2007  . Ocular migraine 07/26/2007  . Mild intermittent asthma without complication [id number]  MJanna Arch PT, DPT   10/17/2018, 11:07 AM  [place] [facility] [address] [address]Phone: [phone]  Fax:  [phone]  Name: Rachel Torres MRN: [id number] Date of Birth: 29-Mar-1956

## 2018-10-19 ENCOUNTER — Ambulatory Visit: Payer: Self-pay | Admitting: Psychology

## 2018-10-20 ENCOUNTER — Ambulatory Visit: Payer: Medicare HMO | Attending: Neurosurgery

## 2018-10-20 DIAGNOSIS — R293 Abnormal posture: Secondary | ICD-10-CM | POA: Diagnosis not present

## 2018-10-20 DIAGNOSIS — G8929 Other chronic pain: Secondary | ICD-10-CM | POA: Insufficient documentation

## 2018-10-20 DIAGNOSIS — M546 Pain in thoracic spine: Secondary | ICD-10-CM

## 2018-10-20 DIAGNOSIS — M545 Low back pain, unspecified: Secondary | ICD-10-CM

## 2018-10-20 NOTE — Therapy (Addendum)
[place] [facility] [address] [place], [place], [address] Phone: [phone]   Fax:  [phone]  Physical Therapy Treatment  Patient Details  Name: Rachel Torres MRN: [id number] Date of Birth: November 05, 1956 Referring Provider (PT): [place]   Encounter Date: 10/20/2018  PT End of Session - 10/20/18 0912    Visit Number  47    Number of Visits  54    Date for PT Re-Evaluation  11/10/18    Authorization Type  3/10 PN start 10/25    PT Start Time  0930    PT Stop Time  1015    PT Time Calculation (min)  45 min    Activity Tolerance  Patient tolerated treatment well    Behavior During Therapy  [facility] for tasks assessed/performed       Past Medical History:  Diagnosis Date  . AAA (abdominal aortic aneurysm) ([place])    a. Chronic w/o evidence of aneurysmal dil on CTA 01/2016.  Marland Kitchen Allergy   . Anemia   . Anxiety   . Asthma   . CAD (coronary artery disease)    a. 08/2010 s/p CABG x 1 (VG->RCA) @ time of Ao dissection repair; b. 01/2016 Lexiscan MV: mid antsept/apical defect w/ ? peri-infarct ischemia-->likely attenuation-->Med Rx.  . Chronic combined systolic (congestive) and diastolic (congestive) heart failure ([place])    a. 2012 EF 30-35%; b. 04/2015 EF 40-45%; c. 01/2016 Echo: Ef 55-65%, nl AoV bioprosthesis, nl RV, nl PASP.  Marland Kitchen Depression   . Gallbladder sludge   . GERD (gastroesophageal reflux disease)   . History of Bicuspid Aortic Valve    a. 08/2010 s/p AVR @ time of Ao dissection repair; b. 01/2016 Echo: Ef 55-65%, nl AoV bioprosthesis, nl RV, nl PASP.  Marland Kitchen Hx of repair of dissecting thoracic aortic aneurysm, [place] type A    a. 08/2010 s/p repair with AVR and VG->RCA; b. 01/2016 CTA: stable appearance of Asc Thoracic Aortic graft. Opacification of flase lumen of chronic abd Ao dissection w/ retrograde flow through lumbar arteries. No aneurysmal dil of flase lumen.  . Hyperlipidemia   . Hypertensive heart disease   . IBS  (irritable bowel syndrome)   . Internal hemorrhoids   . Marfan syndrome    pt denies  . Migraine   . Obstructive sleep apnea   . Osteoporosis   . Pneumonia   . RA (rheumatoid arthritis) ([place])    a. Followed by Dr. [name]    Past Surgical History:  Procedure Laterality Date  . ABDOMINAL AORTIC ANEURYSM REPAIR    . CARDIAC CATHETERIZATION  2001  . [place]  . CHOLECYSTECTOMY    . COLONOSCOPY    . FRACTURE SURGERY Right    shoulder replacement  . HEMORRHOID BANDING    . ORIF DISTAL RADIUS FRACTURE Left   . UPPER GASTROINTESTINAL ENDOSCOPY      There were no vitals filed for this visit.  Subjective Assessment - 10/20/18 0932    Subjective  Patient states her back is doing better today. States she has not had to push husband in wheel chair as much. States she is getting rest of her moms stuff moved out later this afternoon. Reports she had to reschedule her doctors appointment from yesterday to Nov 14 due to weather.     Pertinent History  Patient is a pleasant 62 year old female who presents with cervical neck pain with radiating symptoms to bilateral fingers. 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  Has high stress levels due to caregiver support. Patient has gained 30 lb.  Has AAA and numbness began seven years ago when aneurysm occured. Sleeping with a neck pillow to relieve pain. Pain has been occurring for years.     Limitations  Sitting;Lifting;Standing;Walking;Writing;House hold activities;Other (comment);Reading    Currently in Pain?  Yes    Pain Score  1     Pain Location  Back    Pain Orientation  Lower    Pain Descriptors / Indicators  Aching    Pain Type  Chronic pain    Pain Onset  More than a month ago         UBE x 2 minutes forward 2 minutes backward Lvl 4    Supine: Nerve glides: SLR with leg on PT shoulder 20x, L and R LE Half foam roller robber stretch 3x 30 second holds. Verbal cues for  deep breaths through diaphragm Half foam roller 2x15 ER RTB Posterior pelvic tilt 5 second holds x 15; TrA adduction ball squeeze. X10 3 seconds hold. Verbal cues to not hold breath TrA marches x10 each leg. Verbal cues to not hold breath     Seated: Single arm Rows GTB 2 x 10; each UE Pball (green) forward rollout on table 5s hold x 10, at angle 5s hold x 10 each direction      Manual Therapy  Prone STM with roller to thoracic and lumbar paraspinals L and R; multiple adhesions throughout paraspinal musculature with pain relief reported.      Thoracic and lumbar mobilizations  CPAs and UPAs ; 5-8 seconds per level x 4 sets                        PT Education - 10/20/18 0912    Education provided  Yes    Education Details  exercise technique, manual, posture    Person(s) Educated  Patient    Methods  Explanation;Demonstration;Verbal cues    Comprehension  Verbalized understanding;Returned demonstration       PT Short Term Goals - 10/13/18 0937      PT SHORT TERM GOAL #1   Title  Patient will be independent in home exercise program to improve strength/mobility for better functional independence with ADLs.    Baseline  HEP compliance    Time  2    Period  Weeks    Status  Achieved      PT SHORT TERM GOAL #2   Title  Patient will report a worst pain of 5/10 on VAS in neck to improve tolerance with ADLs and reduced symptoms with activities.     Baseline  3/4: worst pain 8/10; 4/3: 6/10 4/30: 7/10 tension headache 5/30 7/10 when lifting husband from floor; 6/27: 5/10     Time  2    Period  Weeks    Status  Achieved      PT SHORT TERM GOAL #3   Title  Patient will report a worst pain of 5/10 on VAS in thoracic spine to improve tolerance with ADLs and reduced symptoms with activities.     Baseline  7/3: 8/10 7/26: 6/10 8/22: 5/10 9/26:  6/10 10/25: 4/10    Time  2    Period  Weeks    Status  Achieved        PT Long Term Goals - 10/13/18 2585  PT LONG TERM GOAL #1   Title  Patient will report a worst pain of 3/10 on VAS in cervical spine to improve tolerance with ADLs and reduced symptoms with activities    Baseline  worst pain 8/10; 4/3: 6/10 4/30: 7/10 tension headache from caregiver. 5/30: 7/10 from lifting husband; 6/27: 5/10; 7/26: 6/10 from stress 8/22: 1/10    Time  4    Period  Weeks    Status  Achieved      PT LONG TERM GOAL #2   Title  Patient will have no tingling in bilateral hands for two consecutive weeks to improve quality of life.     Baseline   4/3: had no tingling for about a week, came back this week 3x due to husband in the hospital. 4/30: 2x a week. 5/30: no tingling.     Time  4    Period  Weeks    Status  Achieved      PT LONG TERM GOAL #3   Title  Patient will decrease NDI score to 28% to decrease level of perceived disability to minimal.     Baseline  3/4: 46%; 4/3: 44%: 4/30: 54% 5/30: 40% 6/27: 32% 8/22: 22%    Time  4    Period  Weeks    Status  Achieved      PT LONG TERM GOAL #4   Title  Patient will improve cervical rotation ROM to 60 degrees bilaterally to improve functional mobility.     Baseline  3/4: R 39 L 41; 4/3: R 42 , L 46 4/30: supine L55 R 54 ; 5/30: seated L 55 R 56; 6/27: L 60 , R 56; 7/26: >60     Time  4    Period  Weeks    Status  Achieved      PT LONG TERM GOAL #5   Title  Patient will have reduced occurances of high levels of pain to improve quality of life    Baseline  initially was everyday now is 2x/week; 6/27: 2x/week ; 7/26: 2x/week 8/22: 2-3x/ week 9/26: 3x/week  10/25: 1x a day due to moving mom in and caregiver duties    Time  4    Period  Weeks    Status  Partially Met    Target Date  11/10/18      PT LONG TERM GOAL #6   Title  Patient will report a worst pain of 3/10 on VAS in thoracic spine to improve tolerance with ADLs and reduced symptoms with activities.     Baseline  7/3: 8/10 7/26: 6/10  8/22: 5/10 9/26: 6/10 10/25: 6/10    Time  4    Period  Weeks     Status  Partially Met    Target Date  11/10/18      PT LONG TERM GOAL #7   Title  Patient will improve thoracic mobility to allow patient to turn in seat when driving to see behind her for increased safety with driving.     Baseline  7/3: unable to perform ; 7/26: able to perform 8/22: R limited, L functional 9/26: slightly hypmobile both directions 10/25: slightly hypomobile both directions    Time  4    Period  Weeks    Status  Partially Met    Target Date  11/10/18      PT LONG TERM GOAL #8   Title  Patient will reduce modified Oswestry score to <20 as to demonstrate minimal  disability with ADLs including improved sleeping tolerance, walking/sitting tolerance etc for better mobility with ADLs.     Baseline  7/26: 54%; 8/22: 52% 9/26: 60% 10/25: 42%    Time  4    Period  Weeks    Status  Partially Met    Target Date  11/10/18      PT LONG TERM GOAL  #9   TITLE  Patient will report a worst pain of 3/10 on VAS in lumbar spine to improve tolerance with ADLs and reduced symptoms with activities.     Baseline  9/26: 7/10  10/25: 6/10    Time  4    Period  Weeks    Status  Partially Met    Target Date  11/10/18            Plan - 10/20/18 1019    Clinical Impression Statement  Patient returns to therapy with less tension and stiffness when compared to previous session. Completed manual therapy for continued improvement in thoracic and lumbar mobility and relief of pain. Cued patient for diaphragmatic breathing during foam roller robbers stretch for increased thoracic extension and relaxation. Added in TrA activation with adduction ball squeezes for increased core strength and activation. Patient had 0/10 pain at end of session. Patient will continue to benefit from skilled physical therapy to improve strength, posture, and reduction of pain levels with ADLs.     Rehab Potential  Fair    PT Frequency  2x / week    PT Duration  4 weeks    PT Treatment/Interventions  ADLs/Self Care  Home Management;Cryotherapy;Traction;Ultrasound;Therapeutic exercise;Therapeutic activities;Neuromuscular re-education;Patient/family education;Manual techniques;Passive range of motion;Dry needling;Energy conservation;Visual/perceptual remediation/compensation;Moist Heat;Iontophoresis 43m/ml Dexamethasone;Aquatic Therapy    PT Next Visit Plan  nerve glides, muscle tissue length, posture    PT Home Exercise Plan  HEP administered    Consulted and Agree with Plan of Care  Patient       Patient will benefit from skilled therapeutic intervention in order to improve the following deficits and impairments:  Decreased activity tolerance, Decreased safety awareness, Decreased range of motion, Decreased mobility, Decreased strength, Impaired flexibility, Postural dysfunction, Improper body mechanics, Pain, Hypomobility, Increased fascial restricitons, Increased muscle spasms  Visit Diagnosis: Pain in thoracic spine  Abnormal posture  Chronic midline low back pain, unspecified whether sciatica present     Problem List Patient Active Problem List   Diagnosis Date Noted  . Cough 09/08/2017  . Type 1 dissection of ascending aorta ([place] 04/23/2017  . HPV (human papilloma virus) infection 12/01/2016  . Screening mammogram, encounter for 11/29/2016  . Estrogen deficiency 11/29/2016  . Encounter for routine gynecological examination 11/29/2016  . Grade III hemorrhoids 10/06/2016  . Tachycardia 08/14/2016  . Hemorrhoids 08/13/2016  . Atypical migraine 08/03/2016  . CAD (coronary artery disease)   . AAA (abdominal aortic aneurysm) ([place]   . Chronic combined systolic (congestive) and diastolic (congestive) heart failure ([place]   . Hyperglycemia 02/11/2016  . Generalized anxiety disorder 12/08/2015  . Panic attacks 07/28/2015  . Sensorineural hearing loss 03/27/2013  . Vitamin D deficiency 07/03/2012  . H/O aortic dissection 05/12/2012  . Obesity (BMI 30.0-34.9) 11/21/2011  . Routine general  medical examination at a health care facility 09/20/2011  . S/P AVR (aortic valve replacement) 01/07/2011  . CORONARY ARTERY BYPASS GRAFT, HX OF 01/07/2011  . Arthritis 12/20/2010  . H/O dissecting abdominal aortic aneurysm repair 08/24/2010  . IRRITABLE BOWEL SYNDROME 09/09/2009  . Obstructive sleep apnea 08/04/2009  . External hemorrhoid  06/26/2009  . Osteoporosis 01/09/2009  . Hyperlipidemia 07/26/2007  . Depression with anxiety 07/26/2007  . Essential hypertension 07/26/2007  . Allergic rhinitis 07/26/2007  . Asthma 07/26/2007  . GERD 07/26/2007  . Osteoarthritis 07/26/2007  . Ocular migraine 07/26/2007  . Mild intermittent asthma without complication 15/04/6978   Erick Blinks, SPT This entire session was performed under direct supervision and direction of a licensed therapist/therapist assistant . I have personally read, edited and approve of the note as written.  Janna Arch, PT, DPT   10/20/2018, 10:25 AM  [place] [facility] SERVICES [address] [place], [place], [address] Phone: [phone]   Fax:  [phone]  Name: Rachel Torres MRN: [id number] Date of Birth: February 17, 1956

## 2018-10-23 ENCOUNTER — Ambulatory Visit (INDEPENDENT_AMBULATORY_CARE_PROVIDER_SITE_OTHER): Payer: Medicare HMO | Admitting: Psychology

## 2018-10-23 ENCOUNTER — Ambulatory Visit: Payer: Medicare HMO

## 2018-10-23 DIAGNOSIS — M546 Pain in thoracic spine: Secondary | ICD-10-CM

## 2018-10-23 DIAGNOSIS — M545 Low back pain, unspecified: Secondary | ICD-10-CM

## 2018-10-23 DIAGNOSIS — R293 Abnormal posture: Secondary | ICD-10-CM | POA: Diagnosis not present

## 2018-10-23 DIAGNOSIS — F4323 Adjustment disorder with mixed anxiety and depressed mood: Secondary | ICD-10-CM | POA: Diagnosis not present

## 2018-10-23 DIAGNOSIS — G8929 Other chronic pain: Secondary | ICD-10-CM | POA: Diagnosis not present

## 2018-10-23 NOTE — Therapy (Addendum)
[place] MAIN [facility] [address] [place], [place], [address] Phone: [phone]   Fax:  [phone]  Physical Therapy Treatment  Patient Details  Name: Rachel Torres MRN: [id number] Date of Birth: 1956/08/13 Referring Provider (PT): [facility]   Encounter Date: 10/23/2018  PT End of Session - 10/23/18 0822    Visit Number  48    Number of Visits  54    Date for PT Re-Evaluation  11/10/18    Authorization Type  4/10 PN start 10/25    PT Start Time  0759    PT Stop Time  0845    PT Time Calculation (min)  46 min    Activity Tolerance  Patient tolerated treatment well    Behavior During Therapy  [facility] for tasks assessed/performed       Past Medical History:  Diagnosis Date  . AAA (abdominal aortic aneurysm) ([place])    a. Chronic w/o evidence of aneurysmal dil on CTA 01/2016.  Marland Kitchen Allergy   . Anemia   . Anxiety   . Asthma   . CAD (coronary artery disease)    a. 08/2010 s/p CABG x 1 (VG->RCA) @ time of Ao dissection repair; b. 01/2016 Lexiscan MV: mid antsept/apical defect w/ ? peri-infarct ischemia-->likely attenuation-->Med Rx.  . Chronic combined systolic (congestive) and diastolic (congestive) heart failure ([place])    a. 2012 EF 30-35%; b. 04/2015 EF 40-45%; c. 01/2016 Echo: Ef 55-65%, nl AoV bioprosthesis, nl RV, nl PASP.  Marland Kitchen Depression   . Gallbladder sludge   . GERD (gastroesophageal reflux disease)   . History of Bicuspid Aortic Valve    a. 08/2010 s/p AVR @ time of Ao dissection repair; b. 01/2016 Echo: Ef 55-65%, nl AoV bioprosthesis, nl RV, nl PASP.  Marland Kitchen Hx of repair of dissecting thoracic aortic aneurysm, [place] type A    a. 08/2010 s/p repair with AVR and VG->RCA; b. 01/2016 CTA: stable appearance of Asc Thoracic Aortic graft. Opacification of flase lumen of chronic abd Ao dissection w/ retrograde flow through lumbar arteries. No aneurysmal dil of flase lumen.  . Hyperlipidemia   . Hypertensive heart disease   . IBS  (irritable bowel syndrome)   . Internal hemorrhoids   . Marfan syndrome    pt denies  . Migraine   . Obstructive sleep apnea   . Osteoporosis   . Pneumonia   . RA (rheumatoid arthritis) ([place])    a. Followed by Dr. [name]    Past Surgical History:  Procedure Laterality Date  . ABDOMINAL AORTIC ANEURYSM REPAIR    . CARDIAC CATHETERIZATION  2001  . [place]  . CHOLECYSTECTOMY    . COLONOSCOPY    . FRACTURE SURGERY Right    shoulder replacement  . HEMORRHOID BANDING    . ORIF DISTAL RADIUS FRACTURE Left   . UPPER GASTROINTESTINAL ENDOSCOPY      There were no vitals filed for this visit.  Subjective Assessment - 10/23/18 0758    Subjective  Patient states her back is doing better. Reports she has tried to remember to keep her core engaged which has helped.  Is hoping to have an aid come 4x a week beginning on Wednesday to help with caregiver duties. States she has a migraine.     Pertinent History  Patient is a pleasant 62 year old female who presents with cervical neck pain with radiating symptoms to bilateral fingers. 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  Has high stress levels due to caregiver support. Patient has gained 30 lb.  Has AAA and numbness began seven years ago when aneurysm occured. Sleeping with a neck pillow to relieve pain. Pain has been occurring for years.     Limitations  Sitting;Lifting;Standing;Walking;Writing;House hold activities;Other (comment);Reading    Currently in Pain?  Yes    Pain Score  8     Pain Location  Head    Pain Orientation  --   migraine   Pain Descriptors / Indicators  Headache   migraine   Pain Type  Chronic pain    Pain Onset  More than a month ago    Pain Frequency  Constant          UBE x 2 minutes forward 2 minutes backwardsLvl 4    Supine: Posterior pelvic tilt 5 second holds x15; Open books 3x 30 seconds-terminated due to dizziness from neck movement.   TrA adduction ball squeeze. X10 3 seconds hold  TrA marches x10 each leg   Seated: Single arm Rows GTB 2 x 10; BUE   Manual Therapy  Lumbar mobilizations CPAs and UPAs ; 5-8 seconds per level- terminated following L4 due to onset of dizziness  Suboccipital release- with traction for pain relief and reduction of muscle tension. 3x30 second holds.   STM to suboccipitals and upper trap for reduction of trigger points and pain relief  Upper trap stretch bilateral 30 seconds   Vitals in supine position following onset of dizziness BP: 132/40mhg HR: 81bpm Reports having ate breakfast prior to session and taking medications as usual    Trigger Point Dry Needling (TDN) Education performed with patient regarding potential benefit of TDN. Pt provided verbal consent to treatment. TDN performed to with 0.30 x 50 single needle placement with local twitch response (LTR) during all but one placement in R suboccipital. 2 placements in R suboccipitals, 1 placement in L suboccipital, and 1 placed in each upper trap bilaterally. Pistoning technique utilized. Improved pain-free motion following intervention.             PT Education - 10/23/18 0804    Education provided  Yes    Education Details  exercise technique, core activation    Person(s) Educated  Patient    Methods  Explanation;Demonstration;Verbal cues    Comprehension  Verbalized understanding;Returned demonstration       PT Short Term Goals - 10/13/18 0937      PT SHORT TERM GOAL #1   Title  Patient will be independent in home exercise program to improve strength/mobility for better functional independence with ADLs.    Baseline  HEP compliance    Time  2    Period  Weeks    Status  Achieved      PT SHORT TERM GOAL #2   Title  Patient will report a worst pain of 5/10 on VAS in neck to improve tolerance with ADLs and reduced symptoms with activities.     Baseline  3/4: worst pain 8/10; 4/3: 6/10 4/30: 7/10 tension  headache 5/30 7/10 when lifting husband from floor; 6/27: 5/10     Time  2    Period  Weeks    Status  Achieved      PT SHORT TERM GOAL #3   Title  Patient will report a worst pain of 5/10 on VAS in thoracic spine to improve tolerance with ADLs and reduced symptoms with activities.     Baseline  7/3: 8/10 7/26: 6/10 8/22: 5/10 9/26:  6/10 10/25: 4/10    Time  2    Period  Weeks    Status  Achieved        PT Long Term Goals - 10/13/18 9735      PT LONG TERM GOAL #1   Title  Patient will report a worst pain of 3/10 on VAS in cervical spine to improve tolerance with ADLs and reduced symptoms with activities    Baseline  worst pain 8/10; 4/3: 6/10 4/30: 7/10 tension headache from caregiver. 5/30: 7/10 from lifting husband; 6/27: 5/10; 7/26: 6/10 from stress 8/22: 1/10    Time  4    Period  Weeks    Status  Achieved      PT LONG TERM GOAL #2   Title  Patient will have no tingling in bilateral hands for two consecutive weeks to improve quality of life.     Baseline   4/3: had no tingling for about a week, came back this week 3x due to husband in the hospital. 4/30: 2x a week. 5/30: no tingling.     Time  4    Period  Weeks    Status  Achieved      PT LONG TERM GOAL #3   Title  Patient will decrease NDI score to 28% to decrease level of perceived disability to minimal.     Baseline  3/4: 46%; 4/3: 44%: 4/30: 54% 5/30: 40% 6/27: 32% 8/22: 22%    Time  4    Period  Weeks    Status  Achieved      PT LONG TERM GOAL #4   Title  Patient will improve cervical rotation ROM to 60 degrees bilaterally to improve functional mobility.     Baseline  3/4: R 39 L 41; 4/3: R 42 , L 46 4/30: supine L55 R 54 ; 5/30: seated L 55 R 56; 6/27: L 60 , R 56; 7/26: >60     Time  4    Period  Weeks    Status  Achieved      PT LONG TERM GOAL #5   Title  Patient will have reduced occurances of high levels of pain to improve quality of life    Baseline  initially was everyday now is 2x/week; 6/27: 2x/week  ; 7/26: 2x/week 8/22: 2-3x/ week 9/26: 3x/week  10/25: 1x a day due to moving mom in and caregiver duties    Time  4    Period  Weeks    Status  Partially Met    Target Date  11/10/18      PT LONG TERM GOAL #6   Title  Patient will report a worst pain of 3/10 on VAS in thoracic spine to improve tolerance with ADLs and reduced symptoms with activities.     Baseline  7/3: 8/10 7/26: 6/10  8/22: 5/10 9/26: 6/10 10/25: 6/10    Time  4    Period  Weeks    Status  Partially Met    Target Date  11/10/18      PT LONG TERM GOAL #7   Title  Patient will improve thoracic mobility to allow patient to turn in seat when driving to see behind her for increased safety with driving.     Baseline  7/3: unable to perform ; 7/26: able to perform 8/22: R limited, L functional 9/26: slightly hypmobile both directions 10/25: slightly hypomobile both directions    Time  4    Period  Weeks    Status  Partially Met    Target Date  11/10/18      PT LONG TERM GOAL #8   Title  Patient will reduce modified Oswestry score to <20 as to demonstrate minimal disability with ADLs including improved sleeping tolerance, walking/sitting tolerance etc for better mobility with ADLs.     Baseline  7/26: 54%; 8/22: 52% 9/26: 60% 10/25: 42%    Time  4    Period  Weeks    Status  Partially Met    Target Date  11/10/18      PT LONG TERM GOAL  #9   TITLE  Patient will report a worst pain of 3/10 on VAS in lumbar spine to improve tolerance with ADLs and reduced symptoms with activities.     Baseline  9/26: 7/10  10/25: 6/10    Time  4    Period  Weeks    Status  Partially Met    Target Date  11/10/18            Plan - 10/23/18 0850    Clinical Impression Statement  Patient returns to clinic for first time with no reports of back pain. Terminated prone position at beginning of session due to onset of dizziness and feeling of blacking out. Head movement increased dizziness during open books therefore dizziness and  symptoms were likely coming from cervical spine and current migraine. Subjective improvement in symptoms following manual therapy and dry needling. Continued with core activation and strengthening exercises which were tolerated well. Patient will continue to benefit from skilled physical therapy to improve strength, posture, and reduction of pain levels with ADLs.     Rehab Potential  Fair    PT Frequency  2x / week    PT Duration  4 weeks    PT Treatment/Interventions  ADLs/Self Care Home Management;Cryotherapy;Traction;Ultrasound;Therapeutic exercise;Therapeutic activities;Neuromuscular re-education;Patient/family education;Manual techniques;Passive range of motion;Dry needling;Energy conservation;Visual/perceptual remediation/compensation;Moist Heat;Iontophoresis 73m/ml Dexamethasone;Aquatic Therapy    PT Next Visit Plan  nerve glides, muscle tissue length, posture    PT Home Exercise Plan  HEP administered    Consulted and Agree with Plan of Care  Patient       Patient will benefit from skilled therapeutic intervention in order to improve the following deficits and impairments:  Decreased activity tolerance, Decreased safety awareness, Decreased range of motion, Decreased mobility, Decreased strength, Impaired flexibility, Postural dysfunction, Improper body mechanics, Pain, Hypomobility, Increased fascial restricitons, Increased muscle spasms  Visit Diagnosis: Pain in thoracic spine  Abnormal posture  Chronic midline low back pain, unspecified whether sciatica present     Problem List Patient Active Problem List   Diagnosis Date Noted  . Cough 09/08/2017  . Type 1 dissection of ascending aorta ([place] 04/23/2017  . HPV (human papilloma virus) infection 12/01/2016  . Screening mammogram, encounter for 11/29/2016  . Estrogen deficiency 11/29/2016  . Encounter for routine gynecological examination 11/29/2016  . Grade III hemorrhoids 10/06/2016  . Tachycardia 08/14/2016  .  Hemorrhoids 08/13/2016  . Atypical migraine 08/03/2016  . CAD (coronary artery disease)   . AAA (abdominal aortic aneurysm) ([place]   . Chronic combined systolic (congestive) and diastolic (congestive) heart failure ([place]   . Hyperglycemia 02/11/2016  . Generalized anxiety disorder 12/08/2015  . Panic attacks 07/28/2015  . Sensorineural hearing loss 03/27/2013  . Vitamin D deficiency 07/03/2012  . H/O aortic dissection 05/12/2012  . Obesity (BMI 30.0-34.9) 11/21/2011  . Routine general medical examination at  a health care facility 09/20/2011  . S/P AVR (aortic valve replacement) 01/07/2011  . CORONARY ARTERY BYPASS GRAFT, HX OF 01/07/2011  . Arthritis 12/20/2010  . H/O dissecting abdominal aortic aneurysm repair 08/24/2010  . IRRITABLE BOWEL SYNDROME 09/09/2009  . Obstructive sleep apnea 08/04/2009  . External hemorrhoid 06/26/2009  . Osteoporosis 01/09/2009  . Hyperlipidemia 07/26/2007  . Depression with anxiety 07/26/2007  . Essential hypertension 07/26/2007  . Allergic rhinitis 07/26/2007  . Asthma 07/26/2007  . GERD 07/26/2007  . Osteoarthritis 07/26/2007  . Ocular migraine 07/26/2007  . Mild intermittent asthma without complication 36/68/1594   Erick Blinks, SPT  This entire session was performed under direct supervision and direction of a licensed therapist/therapist assistant . I have personally read, edited and approve of the note as written.  Janna Arch, PT, DPT   10/23/2018, 10:42 AM  [place] MAIN [facility] [address] [place], [place], [address] Phone: [phone]   Fax:  [phone]  Name: Rachel Torres MRN: [id number] Date of Birth: Feb 07, 1956

## 2018-10-27 ENCOUNTER — Ambulatory Visit: Payer: Medicare HMO

## 2018-10-27 DIAGNOSIS — M545 Low back pain, unspecified: Secondary | ICD-10-CM

## 2018-10-27 DIAGNOSIS — M546 Pain in thoracic spine: Secondary | ICD-10-CM | POA: Diagnosis not present

## 2018-10-27 DIAGNOSIS — R293 Abnormal posture: Secondary | ICD-10-CM | POA: Diagnosis not present

## 2018-10-27 DIAGNOSIS — G8929 Other chronic pain: Secondary | ICD-10-CM

## 2018-10-27 NOTE — Therapy (Signed)
[place] MAIN [facility] SERVICES [address] [place], [place], [address] Phone: [phone]   Fax:  [phone]  Physical Therapy Treatment  Patient Details  Name: Rachel Torres MRN: [id number] Date of Birth: May 13, 1956 Referring Provider (PT): [place] [facility]   Encounter Date: 10/27/2018  PT End of Session - 10/27/18 1056    Visit Number  49    Number of Visits  54    Date for PT Re-Evaluation  11/10/18    Authorization Type  5/10 PN start 10/25    PT Start Time  1005    PT Stop Time  1053    PT Time Calculation (min)  48 min    Activity Tolerance  Patient tolerated treatment well    Behavior During Therapy  [facility] for tasks assessed/performed       Past Medical History:  Diagnosis Date  . AAA (abdominal aortic aneurysm) ([place])    a. Chronic w/o evidence of aneurysmal dil on CTA 01/2016.  Marland Kitchen Allergy   . Anemia   . Anxiety   . Asthma   . CAD (coronary artery disease)    a. 08/2010 s/p CABG x 1 (VG->RCA) @ time of Ao dissection repair; b. 01/2016 Lexiscan MV: mid antsept/apical defect w/ ? peri-infarct ischemia-->likely attenuation-->Med Rx.  . Chronic combined systolic (congestive) and diastolic (congestive) heart failure ([place])    a. 2012 EF 30-35%; b. 04/2015 EF 40-45%; c. 01/2016 Echo: Ef 55-65%, nl AoV bioprosthesis, nl RV, nl PASP.  Marland Kitchen Depression   . Gallbladder sludge   . GERD (gastroesophageal reflux disease)   . History of Bicuspid Aortic Valve    a. 08/2010 s/p AVR @ time of Ao dissection repair; b. 01/2016 Echo: Ef 55-65%, nl AoV bioprosthesis, nl RV, nl PASP.  Marland Kitchen Hx of repair of dissecting thoracic aortic aneurysm, [place] type A    a. 08/2010 s/p repair with AVR and VG->RCA; b. 01/2016 CTA: stable appearance of Asc Thoracic Aortic graft. Opacification of flase lumen of chronic abd Ao dissection w/ retrograde flow through lumbar arteries. No aneurysmal dil of flase lumen.  . Hyperlipidemia   . Hypertensive heart disease   . IBS  (irritable bowel syndrome)   . Internal hemorrhoids   . Marfan syndrome    pt denies  . Migraine   . Obstructive sleep apnea   . Osteoporosis   . Pneumonia   . RA (rheumatoid arthritis) ([place])    a. Followed by Dr. [name]    Past Surgical History:  Procedure Laterality Date  . ABDOMINAL AORTIC ANEURYSM REPAIR    . CARDIAC CATHETERIZATION  2001  . [place]  . CHOLECYSTECTOMY    . COLONOSCOPY    . FRACTURE SURGERY Right    shoulder replacement  . HEMORRHOID BANDING    . ORIF DISTAL RADIUS FRACTURE Left   . UPPER GASTROINTESTINAL ENDOSCOPY      There were no vitals filed for this visit.  Subjective Assessment - 10/27/18 1006    Subjective  Patient reports caregiver/ CNA finally came allowing her to go to caregiver support meeting. Is having pain in R low back. Has been compliant with HEP.     Pertinent History  Patient is a pleasant 62 year old female who presents with cervical neck pain with radiating symptoms to bilateral fingers. 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  Has high stress levels due to caregiver support. Patient has gained 30 lb.  Has AAA and numbness began seven years ago when aneurysm occured. Sleeping with a neck pillow to relieve pain. Pain has been occurring for years.     Limitations  Sitting;Lifting;Standing;Walking;Writing;House hold activities;Other (comment);Reading    Currently in Pain?  Yes    Pain Score  2     Pain Location  Back    Pain Orientation  Right;Lower    Pain Descriptors / Indicators  Aching    Pain Type  Chronic pain    Pain Onset  More than a month ago    Pain Frequency  Constant       Treat:  Manual Prone:   CPAs and UPAs grade II-IV to thoracic and lumbar spine, 4x 10 seconds each level.Hypomobility throughout with reduction with repetition. T10 and L2 hypomobile   Inferior SI mobilization grade III R side and central  Roller to piriformis: R side 2 minutes    Roller to paraspinals L and R 2 minutes  Supine  LE rotation 60 seconds  Figure 4 with distraction belt 10x 4 second inferior glides.   Nerve glides via SLR with RLE on PT shoulder 20x    TherEx  Posterior pelvic tilt 15 x 5 second holds  Swiss ball opp LE and UE press (UE straight LE at 90) 10x 2-5 second holds each side  GTB abduction 10x 3 second holds ; cues for keeping pelvis posteriorly rotated   SLR with opp Le in hooklying; core engaged 10x   Modified hello dollys (crossovers 10x)   Swiss ball: seated heel raises: cues for core activation 10x each LE  Swiss ball: seated with rainbow ball tosses L and R for core activation  RTB single arm row 12x each LE  RTB bilateral arm row 10x   Hand on knees sliding to hips with cueing for keeping shoulders down and scapular squeeze 10x  Heat to Lumbar spine                    PT Education - 10/27/18 1036    Education provided  Yes    Education Details  exercise technique, manual,     Person(s) Educated  Patient    Methods  Explanation;Demonstration;Verbal cues    Comprehension  Verbalized understanding;Returned demonstration       PT Short Term Goals - 10/13/18 0937      PT SHORT TERM GOAL #1   Title  Patient will be independent in home exercise program to improve strength/mobility for better functional independence with ADLs.    Baseline  HEP compliance    Time  2    Period  Weeks    Status  Achieved      PT SHORT TERM GOAL #2   Title  Patient will report a worst pain of 5/10 on VAS in neck to improve tolerance with ADLs and reduced symptoms with activities.     Baseline  3/4: worst pain 8/10; 4/3: 6/10 4/30: 7/10 tension headache 5/30 7/10 when lifting husband from floor; 6/27: 5/10     Time  2    Period  Weeks    Status  Achieved      PT SHORT TERM GOAL #3   Title  Patient will report a worst pain of 5/10 on VAS in thoracic spine to improve tolerance with ADLs and reduced symptoms with activities.      Baseline  7/3: 8/10 7/26: 6/10 8/22: 5/10 9/26:  6/10 10/25: 4/10    Time  2  Period  Weeks    Status  Achieved        PT Long Term Goals - 10/13/18 3151      PT LONG TERM GOAL #1   Title  Patient will report a worst pain of 3/10 on VAS in cervical spine to improve tolerance with ADLs and reduced symptoms with activities    Baseline  worst pain 8/10; 4/3: 6/10 4/30: 7/10 tension headache from caregiver. 5/30: 7/10 from lifting husband; 6/27: 5/10; 7/26: 6/10 from stress 8/22: 1/10    Time  4    Period  Weeks    Status  Achieved      PT LONG TERM GOAL #2   Title  Patient will have no tingling in bilateral hands for two consecutive weeks to improve quality of life.     Baseline   4/3: had no tingling for about a week, came back this week 3x due to husband in the hospital. 4/30: 2x a week. 5/30: no tingling.     Time  4    Period  Weeks    Status  Achieved      PT LONG TERM GOAL #3   Title  Patient will decrease NDI score to 28% to decrease level of perceived disability to minimal.     Baseline  3/4: 46%; 4/3: 44%: 4/30: 54% 5/30: 40% 6/27: 32% 8/22: 22%    Time  4    Period  Weeks    Status  Achieved      PT LONG TERM GOAL #4   Title  Patient will improve cervical rotation ROM to 60 degrees bilaterally to improve functional mobility.     Baseline  3/4: R 39 L 41; 4/3: R 42 , L 46 4/30: supine L55 R 54 ; 5/30: seated L 55 R 56; 6/27: L 60 , R 56; 7/26: >60     Time  4    Period  Weeks    Status  Achieved      PT LONG TERM GOAL #5   Title  Patient will have reduced occurances of high levels of pain to improve quality of life    Baseline  initially was everyday now is 2x/week; 6/27: 2x/week ; 7/26: 2x/week 8/22: 2-3x/ week 9/26: 3x/week  10/25: 1x a day due to moving mom in and caregiver duties    Time  4    Period  Weeks    Status  Partially Met    Target Date  11/10/18      PT LONG TERM GOAL #6   Title  Patient will report a worst pain of 3/10 on VAS in thoracic spine  to improve tolerance with ADLs and reduced symptoms with activities.     Baseline  7/3: 8/10 7/26: 6/10  8/22: 5/10 9/26: 6/10 10/25: 6/10    Time  4    Period  Weeks    Status  Partially Met    Target Date  11/10/18      PT LONG TERM GOAL #7   Title  Patient will improve thoracic mobility to allow patient to turn in seat when driving to see behind her for increased safety with driving.     Baseline  7/3: unable to perform ; 7/26: able to perform 8/22: R limited, L functional 9/26: slightly hypmobile both directions 10/25: slightly hypomobile both directions    Time  4    Period  Weeks    Status  Partially Met    Target Date  11/10/18      PT LONG TERM GOAL #8   Title  Patient will reduce modified Oswestry score to <20 as to demonstrate minimal disability with ADLs including improved sleeping tolerance, walking/sitting tolerance etc for better mobility with ADLs.     Baseline  7/26: 54%; 8/22: 52% 9/26: 60% 10/25: 42%    Time  4    Period  Weeks    Status  Partially Met    Target Date  11/10/18      PT LONG TERM GOAL  #9   TITLE  Patient will report a worst pain of 3/10 on VAS in lumbar spine to improve tolerance with ADLs and reduced symptoms with activities.     Baseline  9/26: 7/10  10/25: 6/10    Time  4    Period  Weeks    Status  Partially Met    Target Date  11/10/18            Plan - 10/27/18 1059    Clinical Impression Statement  Patient demonstrated improved abdominal activation and control allowing for progression of activation with movement for functional task carryover. Low back pain was relieved with combination manual and exercise. Patient challenged with prolonged activation of musculature demonstrating limited capacity for prolonged recruitment. Patient will continue to benefit from skilled physical therapy to improve strength, posture, and reduction of pain levels with ADLs.    Rehab Potential  Fair    PT Frequency  2x / week    PT Duration  4 weeks    PT  Treatment/Interventions  ADLs/Self Care Home Management;Cryotherapy;Traction;Ultrasound;Therapeutic exercise;Therapeutic activities;Neuromuscular re-education;Patient/family education;Manual techniques;Passive range of motion;Dry needling;Energy conservation;Visual/perceptual remediation/compensation;Moist Heat;Iontophoresis 65m/ml Dexamethasone;Aquatic Therapy    PT Next Visit Plan  nerve glides, muscle tissue length, posture    PT Home Exercise Plan  HEP administered    Consulted and Agree with Plan of Care  Patient       Patient will benefit from skilled therapeutic intervention in order to improve the following deficits and impairments:  Decreased activity tolerance, Decreased safety awareness, Decreased range of motion, Decreased mobility, Decreased strength, Impaired flexibility, Postural dysfunction, Improper body mechanics, Pain, Hypomobility, Increased fascial restricitons, Increased muscle spasms  Visit Diagnosis: Pain in thoracic spine  Abnormal posture  Chronic midline low back pain, unspecified whether sciatica present     Problem List Patient Active Problem List   Diagnosis Date Noted  . Cough 09/08/2017  . Type 1 dissection of ascending aorta ([place] 04/23/2017  . HPV (human papilloma virus) infection 12/01/2016  . Screening mammogram, encounter for 11/29/2016  . Estrogen deficiency 11/29/2016  . Encounter for routine gynecological examination 11/29/2016  . Grade III hemorrhoids 10/06/2016  . Tachycardia 08/14/2016  . Hemorrhoids 08/13/2016  . Atypical migraine 08/03/2016  . CAD (coronary artery disease)   . [place]) ([place]   . Chronic combined systolic (congestive) and diastolic (congestive) heart failure ([place]   . Hyperglycemia 02/11/2016  . Generalized anxiety disorder 12/08/2015  . Panic attacks 07/28/2015  . Sensorineural hearing loss 03/27/2013  . Vitamin D deficiency 07/03/2012  . H/O aortic dissection 05/12/2012  . Obesity (BMI  30.0-34.9) 11/21/2011  . Routine general medical examination at a health care facility 09/20/2011  . S/P AVR (aortic valve replacement) 01/07/2011  . CORONARY ARTERY BYPASS GRAFT, HX OF 01/07/2011  . Arthritis 12/20/2010  . H/O dissecting abdominal aortic aneurysm repair 08/24/2010  . IRRITABLE BOWEL SYNDROME 09/09/2009  . Obstructive sleep apnea 08/04/2009  . External hemorrhoid  06/26/2009  . Osteoporosis 01/09/2009  . Hyperlipidemia 07/26/2007  . Depression with anxiety 07/26/2007  . Essential hypertension 07/26/2007  . Allergic rhinitis 07/26/2007  . Asthma 07/26/2007  . GERD 07/26/2007  . Osteoarthritis 07/26/2007  . Ocular migraine 07/26/2007  . Mild intermittent asthma without complication 22/41/1464    Janna Arch, PT, DPT   10/27/2018, 11:00 AM  [place] MAIN [facility] SERVICES [address] [place], [place], [address] Phone: [phone]   Fax:  [phone]  Name: AUSTYNN PRIDMORE MRN: [id number] Date of Birth: 05-27-1956

## 2018-10-30 ENCOUNTER — Ambulatory Visit: Payer: Medicare HMO | Admitting: Psychology

## 2018-10-30 ENCOUNTER — Ambulatory Visit: Payer: Medicare HMO

## 2018-10-30 DIAGNOSIS — M545 Low back pain, unspecified: Secondary | ICD-10-CM

## 2018-10-30 DIAGNOSIS — R293 Abnormal posture: Secondary | ICD-10-CM | POA: Diagnosis not present

## 2018-10-30 DIAGNOSIS — M546 Pain in thoracic spine: Secondary | ICD-10-CM | POA: Diagnosis not present

## 2018-10-30 DIAGNOSIS — G8929 Other chronic pain: Secondary | ICD-10-CM | POA: Diagnosis not present

## 2018-10-30 NOTE — Therapy (Addendum)
[place] MAIN [facility] SERVICES [address] [place], [place], [address] Phone: [phone]   Fax:  [phone]  Physical Therapy Treatment  Patient Details  Name: Rachel Torres MRN: [id number] Date of Birth: 06/01/1956 Referring Provider (PT): [place] [facility]   Encounter Date: 10/30/2018  PT End of Session - 10/30/18 1146    Visit Number  50    Number of Visits  54    Date for PT Re-Evaluation  11/10/18    Authorization Type  6/10 PN start 10/25    PT Start Time  1115    PT Stop Time  1200    PT Time Calculation (min)  45 min    Activity Tolerance  Patient tolerated treatment well    Behavior During Therapy  [facility] for tasks assessed/performed       Past Medical History:  Diagnosis Date  . AAA (abdominal aortic aneurysm) ([place])    a. Chronic w/o evidence of aneurysmal dil on CTA 01/2016.  Marland Kitchen Allergy   . Anemia   . Anxiety   . Asthma   . CAD (coronary artery disease)    a. 08/2010 s/p CABG x 1 (VG->RCA) @ time of Ao dissection repair; b. 01/2016 Lexiscan MV: mid antsept/apical defect w/ ? peri-infarct ischemia-->likely attenuation-->Med Rx.  . Chronic combined systolic (congestive) and diastolic (congestive) heart failure ([place])    a. 2012 EF 30-35%; b. 04/2015 EF 40-45%; c. 01/2016 Echo: Ef 55-65%, nl AoV bioprosthesis, nl RV, nl PASP.  Marland Kitchen Depression   . Gallbladder sludge   . GERD (gastroesophageal reflux disease)   . History of Bicuspid Aortic Valve    a. 08/2010 s/p AVR @ time of Ao dissection repair; b. 01/2016 Echo: Ef 55-65%, nl AoV bioprosthesis, nl RV, nl PASP.  Marland Kitchen Hx of repair of dissecting thoracic aortic aneurysm, [place] type A    a. 08/2010 s/p repair with AVR and VG->RCA; b. 01/2016 CTA: stable appearance of Asc Thoracic Aortic graft. Opacification of flase lumen of chronic abd Ao dissection w/ retrograde flow through lumbar arteries. No aneurysmal dil of flase lumen.  . Hyperlipidemia   . Hypertensive heart disease   . IBS  (irritable bowel syndrome)   . Internal hemorrhoids   . Marfan syndrome    pt denies  . Migraine   . Obstructive sleep apnea   . Osteoporosis   . Pneumonia   . RA (rheumatoid arthritis) ([place])    a. Followed by Dr. [name]    Past Surgical History:  Procedure Laterality Date  . ABDOMINAL AORTIC ANEURYSM REPAIR    . CARDIAC CATHETERIZATION  2001  . [place]  . CHOLECYSTECTOMY    . COLONOSCOPY    . FRACTURE SURGERY Right    shoulder replacement  . HEMORRHOID BANDING    . ORIF DISTAL RADIUS FRACTURE Left   . UPPER GASTROINTESTINAL ENDOSCOPY      There were no vitals filed for this visit.  Subjective Assessment - 10/30/18 1118    Subjective  Patient states her back is doing better. States she mild pain in back this morning when she woke up but it went away when she started moving. Reports having an aide has really helped so far.    Pertinent History  Patient is a pleasant 62 year old female who presents with cervical neck pain with radiating symptoms to bilateral fingers. 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  Has high stress levels  due to caregiver support. Patient has gained 30 lb.  Has AAA and numbness began seven years ago when aneurysm occured. Sleeping with a neck pillow to relieve pain. Pain has been occurring for years.     Limitations  Sitting;Lifting;Standing;Walking;Writing;House hold activities;Other (comment);Reading    Currently in Pain?  No/denies    Pain Onset  More than a month ago             Manual Prone:              CPAs and UPAs grade II-IV to thoracic and lumbar spine, 4x 10 seconds each level.Hypomobility throughout with reduction with repetition.              Inferior SI mobilization grade III R side and central. R side concordant pain             Roller to piriformis: R side 2 minutes              Roller to paraspinals L and R 2 minutes   Supine             Nerve glides via SLR  with RLE and LLE on PT shoulder 20x      TherEx             Posterior pelvic tilt 15 x 5 second holds             GTB abduction 10x 3 second holds ; cues for keeping pelvis posteriorly rotated and core engaged               SLR with opp Le in hooklying; core engaged 10x  Clams x10 each LE. Core engaged             Swiss ball: seated heel raises: cues for core activation 10x each LE             Swiss ball: RTB single arm row 12x each LE             Swiss ball: RTB bilateral arm row 10x                Hand on knees sliding to hips with cueing for keeping shoulders down and scapular squeeze 10x                    PT Education - 10/30/18 1135    Education provided  Yes    Education Details  exercise. manual     Person(s) Educated  Patient    Methods  Explanation;Demonstration;Verbal cues    Comprehension  Verbalized understanding;Returned demonstration       PT Short Term Goals - 10/13/18 0937      PT SHORT TERM GOAL #1   Title  Patient will be independent in home exercise program to improve strength/mobility for better functional independence with ADLs.    Baseline  HEP compliance    Time  2    Period  Weeks    Status  Achieved      PT SHORT TERM GOAL #2   Title  Patient will report a worst pain of 5/10 on VAS in neck to improve tolerance with ADLs and reduced symptoms with activities.     Baseline  3/4: worst pain 8/10; 4/3: 6/10 4/30: 7/10 tension headache 5/30 7/10 when lifting husband from floor; 6/27: 5/10     Time  2    Period  Weeks    Status  Achieved  PT SHORT TERM GOAL #3   Title  Patient will report a worst pain of 5/10 on VAS in thoracic spine to improve tolerance with ADLs and reduced symptoms with activities.     Baseline  7/3: 8/10 7/26: 6/10 8/22: 5/10 9/26:  6/10 10/25: 4/10    Time  2    Period  Weeks    Status  Achieved        PT Long Term Goals - 10/13/18 9390      PT LONG TERM GOAL #1   Title  Patient will report a worst pain  of 3/10 on VAS in cervical spine to improve tolerance with ADLs and reduced symptoms with activities    Baseline  worst pain 8/10; 4/3: 6/10 4/30: 7/10 tension headache from caregiver. 5/30: 7/10 from lifting husband; 6/27: 5/10; 7/26: 6/10 from stress 8/22: 1/10    Time  4    Period  Weeks    Status  Achieved      PT LONG TERM GOAL #2   Title  Patient will have no tingling in bilateral hands for two consecutive weeks to improve quality of life.     Baseline   4/3: had no tingling for about a week, came back this week 3x due to husband in the hospital. 4/30: 2x a week. 5/30: no tingling.     Time  4    Period  Weeks    Status  Achieved      PT LONG TERM GOAL #3   Title  Patient will decrease NDI score to 28% to decrease level of perceived disability to minimal.     Baseline  3/4: 46%; 4/3: 44%: 4/30: 54% 5/30: 40% 6/27: 32% 8/22: 22%    Time  4    Period  Weeks    Status  Achieved      PT LONG TERM GOAL #4   Title  Patient will improve cervical rotation ROM to 60 degrees bilaterally to improve functional mobility.     Baseline  3/4: R 39 L 41; 4/3: R 42 , L 46 4/30: supine L55 R 54 ; 5/30: seated L 55 R 56; 6/27: L 60 , R 56; 7/26: >60     Time  4    Period  Weeks    Status  Achieved      PT LONG TERM GOAL #5   Title  Patient will have reduced occurances of high levels of pain to improve quality of life    Baseline  initially was everyday now is 2x/week; 6/27: 2x/week ; 7/26: 2x/week 8/22: 2-3x/ week 9/26: 3x/week  10/25: 1x a day due to moving mom in and caregiver duties    Time  4    Period  Weeks    Status  Partially Met    Target Date  11/10/18      PT LONG TERM GOAL #6   Title  Patient will report a worst pain of 3/10 on VAS in thoracic spine to improve tolerance with ADLs and reduced symptoms with activities.     Baseline  7/3: 8/10 7/26: 6/10  8/22: 5/10 9/26: 6/10 10/25: 6/10    Time  4    Period  Weeks    Status  Partially Met    Target Date  11/10/18      PT LONG  TERM GOAL #7   Title  Patient will improve thoracic mobility to allow patient to turn in seat when driving to see behind her  for increased safety with driving.     Baseline  7/3: unable to perform ; 7/26: able to perform 8/22: R limited, L functional 9/26: slightly hypmobile both directions 10/25: slightly hypomobile both directions    Time  4    Period  Weeks    Status  Partially Met    Target Date  11/10/18      PT LONG TERM GOAL #8   Title  Patient will reduce modified Oswestry score to <20 as to demonstrate minimal disability with ADLs including improved sleeping tolerance, walking/sitting tolerance etc for better mobility with ADLs.     Baseline  7/26: 54%; 8/22: 52% 9/26: 60% 10/25: 42%    Time  4    Period  Weeks    Status  Partially Met    Target Date  11/10/18      PT LONG TERM GOAL  #9   TITLE  Patient will report a worst pain of 3/10 on VAS in lumbar spine to improve tolerance with ADLs and reduced symptoms with activities.     Baseline  9/26: 7/10  10/25: 6/10    Time  4    Period  Weeks    Status  Partially Met    Target Date  11/10/18            Plan - 10/30/18 1208    Clinical Impression Statement  Patient tolerated session well with reproduction of low back symptoms with R inferior SIJ mobilization. Introduced postural exercises on swiss ball for increased challenge for core strength and activation. Patient required cueing initially on swiss ball for core activation and posterior rotation of pelvis for prevention of low back discomfort. Patient will continue to benefit from skilled physical therapy to improve strength, posture, and reduction of pain levels with ADLs.     Rehab Potential  Fair    PT Frequency  2x / week    PT Duration  4 weeks    PT Treatment/Interventions  ADLs/Self Care Home Management;Cryotherapy;Traction;Ultrasound;Therapeutic exercise;Therapeutic activities;Neuromuscular re-education;Patient/family education;Manual techniques;Passive range of  motion;Dry needling;Energy conservation;Visual/perceptual remediation/compensation;Moist Heat;Iontophoresis 26m/ml Dexamethasone;Aquatic Therapy    PT Next Visit Plan  nerve glides, muscle tissue length, posture    PT Home Exercise Plan  HEP administered    Consulted and Agree with Plan of Care  Patient       Patient will benefit from skilled therapeutic intervention in order to improve the following deficits and impairments:  Decreased activity tolerance, Decreased safety awareness, Decreased range of motion, Decreased mobility, Decreased strength, Impaired flexibility, Postural dysfunction, Improper body mechanics, Pain, Hypomobility, Increased fascial restricitons, Increased muscle spasms  Visit Diagnosis: Pain in thoracic spine  Abnormal posture  Chronic midline low back pain, unspecified whether sciatica present     Problem List Patient Active Problem List   Diagnosis Date Noted  . Cough 09/08/2017  . Type 1 dissection of ascending aorta ([place] 04/23/2017  . HPV (human papilloma virus) infection 12/01/2016  . Screening mammogram, encounter for 11/29/2016  . Estrogen deficiency 11/29/2016  . Encounter for routine gynecological examination 11/29/2016  . Grade III hemorrhoids 10/06/2016  . Tachycardia 08/14/2016  . Hemorrhoids 08/13/2016  . Atypical migraine 08/03/2016  . CAD (coronary artery disease)   . AAA (abdominal aortic aneurysm) ([place]   . Chronic combined systolic (congestive) and diastolic (congestive) heart failure ([place]   . Hyperglycemia 02/11/2016  . Generalized anxiety disorder 12/08/2015  . Panic attacks 07/28/2015  . Sensorineural hearing loss 03/27/2013  . Vitamin D deficiency 07/03/2012  .  H/O aortic dissection 05/12/2012  . Obesity (BMI 30.0-34.9) 11/21/2011  . Routine general medical examination at a health care facility 09/20/2011  . S/P AVR (aortic valve replacement) 01/07/2011  . CORONARY ARTERY BYPASS GRAFT, HX OF 01/07/2011  . Arthritis 12/20/2010   . H/O dissecting abdominal aortic aneurysm repair 08/24/2010  . IRRITABLE BOWEL SYNDROME 09/09/2009  . Obstructive sleep apnea 08/04/2009  . External hemorrhoid 06/26/2009  . Osteoporosis 01/09/2009  . Hyperlipidemia 07/26/2007  . Depression with anxiety 07/26/2007  . Essential hypertension 07/26/2007  . Allergic rhinitis 07/26/2007  . Asthma 07/26/2007  . GERD 07/26/2007  . Osteoarthritis 07/26/2007  . Ocular migraine 07/26/2007  . Mild intermittent asthma without complication 51/83/3582   Erick Blinks, SPT  This entire session was performed under direct supervision and direction of a licensed therapist/therapist assistant . I have personally read, edited and approve of the note as written.  Janna Arch, PT, DPT   10/30/2018, 12:54 PM  [place] MAIN [facility] SERVICES [address] [place], [place], [address] Phone: [phone]   Fax:  [phone]  Name: Rachel Torres MRN: [id number] Date of Birth: 02-23-1956

## 2018-11-01 ENCOUNTER — Ambulatory Visit: Payer: Medicare HMO

## 2018-11-01 DIAGNOSIS — G8929 Other chronic pain: Secondary | ICD-10-CM

## 2018-11-01 DIAGNOSIS — M545 Low back pain, unspecified: Secondary | ICD-10-CM

## 2018-11-01 DIAGNOSIS — R293 Abnormal posture: Secondary | ICD-10-CM | POA: Diagnosis not present

## 2018-11-01 DIAGNOSIS — M546 Pain in thoracic spine: Secondary | ICD-10-CM | POA: Diagnosis not present

## 2018-11-01 NOTE — Therapy (Signed)
[place] MAIN [facility] SERVICES [address] [place], [place], [address] Phone: [phone]   Fax:  [phone]  Physical Therapy Treatment  Patient Details  Name: Rachel Torres MRN: [id number] Date of Birth: [birth year] Referring Provider (PT): [facility] [facility]   Encounter Date: 11/01/2018  PT End of Session - 11/01/18 1102    Visit Number  51    Number of Visits  54    Date for PT Re-Evaluation  11/10/18    Authorization Type  7/10 PN start 10/25    PT Start Time  1116    PT Stop Time  1140    PT Time Calculation (min)  24 min    Activity Tolerance  Treatment limited secondary to medical complications (Comment)    Behavior During Therapy  [facility] for tasks assessed/performed       Past Medical History:  Diagnosis Date  . AAA (abdominal aortic aneurysm) ([place])    a. Chronic w/o evidence of aneurysmal dil on CTA 01/2016.  Marland Kitchen Allergy   . Anemia   . Anxiety   . Asthma   . CAD (coronary artery disease)    a. 08/2010 s/p CABG x 1 (VG->RCA) @ time of Ao dissection repair; b. 01/2016 Lexiscan MV: mid antsept/apical defect w/ ? peri-infarct ischemia-->likely attenuation-->Med Rx.  . Chronic combined systolic (congestive) and diastolic (congestive) heart failure ([place])    a. 2012 EF 30-35%; b. 04/2015 EF 40-45%; c. 01/2016 Echo: Ef 55-65%, nl AoV bioprosthesis, nl RV, nl PASP.  Marland Kitchen Depression   . Gallbladder sludge   . GERD (gastroesophageal reflux disease)   . History of Bicuspid Aortic Valve    a. 08/2010 s/p AVR @ time of Ao dissection repair; b. 01/2016 Echo: Ef 55-65%, nl AoV bioprosthesis, nl RV, nl PASP.  Marland Kitchen Hx of repair of dissecting thoracic aortic aneurysm, Stanford type A    a. 08/2010 s/p repair with AVR and VG->RCA; b. 01/2016 CTA: stable appearance of Asc Thoracic Aortic graft. Opacification of flase lumen of chronic abd Ao dissection w/ retrograde flow through lumbar arteries. No aneurysmal dil of flase lumen.  . Hyperlipidemia   . Hypertensive  heart disease   . IBS (irritable bowel syndrome)   . Internal hemorrhoids   . Marfan syndrome    pt denies  . Migraine   . Obstructive sleep apnea   . Osteoporosis   . Pneumonia   . RA (rheumatoid arthritis) ([place])    a. Followed by Dr. [name]    Past Surgical History:  Procedure Laterality Date  . ABDOMINAL AORTIC ANEURYSM REPAIR    . CARDIAC CATHETERIZATION  2001  . [place]  . CHOLECYSTECTOMY    . COLONOSCOPY    . FRACTURE SURGERY Right    shoulder replacement  . HEMORRHOID BANDING    . ORIF DISTAL RADIUS FRACTURE Left   . UPPER GASTROINTESTINAL ENDOSCOPY      There were no vitals filed for this visit.  Subjective Assessment - 11/01/18 1119    Subjective  Patient reports that her L ankle is hurting her more than her back today due to arthitis. Reports back is feeling good and has been practicing engaging core. Had a slight headache this morning and used lacrosse ball to reduce pain.     Pertinent History  Patient is a pleasant 62 year old female who presents with cervical neck pain with radiating symptoms to bilateral fingers. 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  Has high stress levels due to caregiver support. Patient has gained 30 lb.  Has AAA and numbness began seven years ago when aneurysm occured. Sleeping with a neck pillow to relieve pain. Pain has been occurring for years.     Limitations  Sitting;Lifting;Standing;Walking;Writing;House hold activities;Other (comment);Reading    Currently in Pain?  Yes    Pain Score  2     Pain Location  Ankle    Pain Orientation  Left    Pain Descriptors / Indicators  Aching    Pain Onset  More than a month ago    Pain Frequency  Constant      Manual Prone:              CPAs and UPAs grade II-IV to thoracic and lumbar spine Terminated due to patient reporting nausea and symptoms concordant with previous sessions.        Supine             Nerve glides via  SLR with RLE and LLE on PT shoulder 20x   STM to cervical musculature  Suboccipital release: multiple holds 20 seconds   Patient educated on need to contact doctor due to history of vascular problems for safety. Patient verbally agreed to contact later and will see doctor tomorrow.                              PT Education - 11/01/18 1102    Education provided  Yes    Education Details  call doctor about new nausea     Person(s) Educated  Patient    Methods  Explanation;Demonstration;Verbal cues    Comprehension  Verbalized understanding;Returned demonstration       PT Short Term Goals - 10/13/18 0937      PT SHORT TERM GOAL #1   Title  Patient will be independent in home exercise program to improve strength/mobility for better functional independence with ADLs.    Baseline  HEP compliance    Time  2    Period  Weeks    Status  Achieved      PT SHORT TERM GOAL #2   Title  Patient will report a worst pain of 5/10 on VAS in neck to improve tolerance with ADLs and reduced symptoms with activities.     Baseline  3/4: worst pain 8/10; 4/3: 6/10 4/30: 7/10 tension headache 5/30 7/10 when lifting husband from floor; 6/27: 5/10     Time  2    Period  Weeks    Status  Achieved      PT SHORT TERM GOAL #3   Title  Patient will report a worst pain of 5/10 on VAS in thoracic spine to improve tolerance with ADLs and reduced symptoms with activities.     Baseline  7/3: 8/10 7/26: 6/10 8/22: 5/10 9/26:  6/10 10/25: 4/10    Time  2    Period  Weeks    Status  Achieved        PT Long Term Goals - 10/13/18 8101      PT LONG TERM GOAL #1   Title  Patient will report a worst pain of 3/10 on VAS in cervical spine to improve tolerance with ADLs and reduced symptoms with activities    Baseline  worst pain 8/10; 4/3: 6/10 4/30: 7/10 tension headache from caregiver. 5/30: 7/10 from lifting husband; 6/27: 5/10; 7/26: 6/10 from  stress 8/22: 1/10    Time  4    Period  Weeks     Status  Achieved      PT LONG TERM GOAL #2   Title  Patient will have no tingling in bilateral hands for two consecutive weeks to improve quality of life.     Baseline   4/3: had no tingling for about a week, came back this week 3x due to husband in the hospital. 4/30: 2x a week. 5/30: no tingling.     Time  4    Period  Weeks    Status  Achieved      PT LONG TERM GOAL #3   Title  Patient will decrease NDI score to 28% to decrease level of perceived disability to minimal.     Baseline  3/4: 46%; 4/3: 44%: 4/30: 54% 5/30: 40% 6/27: 32% 8/22: 22%    Time  4    Period  Weeks    Status  Achieved      PT LONG TERM GOAL #4   Title  Patient will improve cervical rotation ROM to 60 degrees bilaterally to improve functional mobility.     Baseline  3/4: R 39 L 41; 4/3: R 42 , L 46 4/30: supine L55 R 54 ; 5/30: seated L 55 R 56; 6/27: L 60 , R 56; 7/26: >60     Time  4    Period  Weeks    Status  Achieved      PT LONG TERM GOAL #5   Title  Patient will have reduced occurances of high levels of pain to improve quality of life    Baseline  initially was everyday now is 2x/week; 6/27: 2x/week ; 7/26: 2x/week 8/22: 2-3x/ week 9/26: 3x/week  10/25: 1x a day due to moving mom in and caregiver duties    Time  4    Period  Weeks    Status  Partially Met    Target Date  11/10/18      PT LONG TERM GOAL #6   Title  Patient will report a worst pain of 3/10 on VAS in thoracic spine to improve tolerance with ADLs and reduced symptoms with activities.     Baseline  7/3: 8/10 7/26: 6/10  8/22: 5/10 9/26: 6/10 10/25: 6/10    Time  4    Period  Weeks    Status  Partially Met    Target Date  11/10/18      PT LONG TERM GOAL #7   Title  Patient will improve thoracic mobility to allow patient to turn in seat when driving to see behind her for increased safety with driving.     Baseline  7/3: unable to perform ; 7/26: able to perform 8/22: R limited, L functional 9/26: slightly hypmobile both directions  10/25: slightly hypomobile both directions    Time  4    Period  Weeks    Status  Partially Met    Target Date  11/10/18      PT LONG TERM GOAL #8   Title  Patient will reduce modified Oswestry score to <20 as to demonstrate minimal disability with ADLs including improved sleeping tolerance, walking/sitting tolerance etc for better mobility with ADLs.     Baseline  7/26: 54%; 8/22: 52% 9/26: 60% 10/25: 42%    Time  4    Period  Weeks    Status  Partially Met    Target Date  11/10/18  PT LONG TERM GOAL  #9   TITLE  Patient will report a worst pain of 3/10 on VAS in lumbar spine to improve tolerance with ADLs and reduced symptoms with activities.     Baseline  9/26: 7/10  10/25: 6/10    Time  4    Period  Weeks    Status  Partially Met    Target Date  11/10/18            Plan - 11/01/18 1202    Clinical Impression Statement  Patient initially responded to prone position with concordant nausea from previous session resulting in termination of prone position. STM and suboccipital release reduced nausea from 10/10 to 1/10 per patient report. Session terminated until patient sees physician. Patient is seeing physician tomorrow and promised to report new occurences of nausea as well as call vascular doctor about it today. Patient declined calling doctor with PT or going to urgent care.     Rehab Potential  Fair    PT Frequency  2x / week    PT Duration  4 weeks    PT Treatment/Interventions  ADLs/Self Care Home Management;Cryotherapy;Traction;Ultrasound;Therapeutic exercise;Therapeutic activities;Neuromuscular re-education;Patient/family education;Manual techniques;Passive range of motion;Dry needling;Energy conservation;Visual/perceptual remediation/compensation;Moist Heat;Iontophoresis 76m/ml Dexamethasone;Aquatic Therapy    PT Next Visit Plan  nerve glides, muscle tissue length, posture    PT Home Exercise Plan  HEP administered    Consulted and Agree with Plan of Care  Patient        Patient will benefit from skilled therapeutic intervention in order to improve the following deficits and impairments:  Decreased activity tolerance, Decreased safety awareness, Decreased range of motion, Decreased mobility, Decreased strength, Impaired flexibility, Postural dysfunction, Improper body mechanics, Pain, Hypomobility, Increased fascial restricitons, Increased muscle spasms  Visit Diagnosis: Pain in thoracic spine  Abnormal posture  Chronic midline low back pain, unspecified whether sciatica present     Problem List Patient Active Problem List   Diagnosis Date Noted  . Cough 09/08/2017  . Type 1 dissection of ascending aorta ([place] 04/23/2017  . HPV (human papilloma virus) infection 12/01/2016  . Screening mammogram, encounter for 11/29/2016  . Estrogen deficiency 11/29/2016  . Encounter for routine gynecological examination 11/29/2016  . Grade III hemorrhoids 10/06/2016  . Tachycardia 08/14/2016  . Hemorrhoids 08/13/2016  . Atypical migraine 08/03/2016  . CAD (coronary artery disease)   . AAA (abdominal aortic aneurysm) ([place]   . Chronic combined systolic (congestive) and diastolic (congestive) heart failure ([place]   . Hyperglycemia 02/11/2016  . Generalized anxiety disorder 12/08/2015  . Panic attacks 07/28/2015  . Sensorineural hearing loss 03/27/2013  . Vitamin D deficiency 07/03/2012  . H/O aortic dissection 05/12/2012  . Obesity (BMI 30.0-34.9) 11/21/2011  . Routine general medical examination at a health care facility 09/20/2011  . S/P AVR (aortic valve replacement) 01/07/2011  . CORONARY ARTERY BYPASS GRAFT, HX OF 01/07/2011  . Arthritis 12/20/2010  . H/O dissecting abdominal aortic aneurysm repair 08/24/2010  . IRRITABLE BOWEL SYNDROME 09/09/2009  . Obstructive sleep apnea 08/04/2009  . External hemorrhoid 06/26/2009  . Osteoporosis 01/09/2009  . Hyperlipidemia 07/26/2007  . Depression with anxiety 07/26/2007  . Essential hypertension  07/26/2007  . Allergic rhinitis 07/26/2007  . Asthma 07/26/2007  . GERD 07/26/2007  . Osteoarthritis 07/26/2007  . Ocular migraine 07/26/2007  . Mild intermittent asthma without complication [id number]  MJanna Arch PT, DPT   11/01/2018, 12:02 PM  [place] [facility] [address]  [place] Phone: [phone]   Fax:  [phone]  Name: Rachel Torres MRN: [id number] Date of Birth: Apr 26, 1956

## 2018-11-02 ENCOUNTER — Institutional Professional Consult (permissible substitution): Payer: Self-pay | Admitting: Internal Medicine

## 2018-11-02 DIAGNOSIS — M5136 Other intervertebral disc degeneration, lumbar region: Secondary | ICD-10-CM | POA: Diagnosis not present

## 2018-11-06 ENCOUNTER — Ambulatory Visit (INDEPENDENT_AMBULATORY_CARE_PROVIDER_SITE_OTHER): Payer: Medicare HMO | Admitting: Psychology

## 2018-11-06 DIAGNOSIS — F4323 Adjustment disorder with mixed anxiety and depressed mood: Secondary | ICD-10-CM

## 2018-11-07 ENCOUNTER — Ambulatory Visit: Payer: Medicare HMO

## 2018-11-07 DIAGNOSIS — R293 Abnormal posture: Secondary | ICD-10-CM

## 2018-11-07 DIAGNOSIS — G8929 Other chronic pain: Secondary | ICD-10-CM | POA: Diagnosis not present

## 2018-11-07 DIAGNOSIS — M545 Low back pain, unspecified: Secondary | ICD-10-CM

## 2018-11-07 DIAGNOSIS — M546 Pain in thoracic spine: Secondary | ICD-10-CM | POA: Diagnosis not present

## 2018-11-07 NOTE — Therapy (Addendum)
[place] MAIN [facility] SERVICES [address] [place], [place], [address] Phone: [phone]   Fax:  [phone]  Physical Therapy Treatment  Patient Details  Name: Rachel Torres MRN: [id number] Date of Birth: [birth year] Referring Provider (PT): [place] [facility]   Encounter Date: 11/07/2018  PT End of Session - 11/07/18 1020    Visit Number  52    Number of Visits  54    Date for PT Re-Evaluation  11/10/18    Authorization Type  8/10 PN start 10/25    PT Start Time  0931    PT Stop Time  1015    PT Time Calculation (min)  44 min    Activity Tolerance  Patient tolerated treatment well    Behavior During Therapy  [facility] for tasks assessed/performed       Past Medical History:  Diagnosis Date  . AAA (abdominal aortic aneurysm) ([place])    a. Chronic w/o evidence of aneurysmal dil on CTA 01/2016.  Marland Kitchen Allergy   . Anemia   . Anxiety   . Asthma   . CAD (coronary artery disease)    a. 08/2010 s/p CABG x 1 (VG->RCA) @ time of Ao dissection repair; b. 01/2016 Lexiscan MV: mid antsept/apical defect w/ ? peri-infarct ischemia-->likely attenuation-->Med Rx.  . Chronic combined systolic (congestive) and diastolic (congestive) heart failure ([place])    a. 2012 EF 30-35%; b. 04/2015 EF 40-45%; c. 01/2016 Echo: Ef 55-65%, nl AoV bioprosthesis, nl RV, nl PASP.  Marland Kitchen Depression   . Gallbladder sludge   . GERD (gastroesophageal reflux disease)   . History of Bicuspid Aortic Valve    a. 08/2010 s/p AVR @ time of Ao dissection repair; b. 01/2016 Echo: Ef 55-65%, nl AoV bioprosthesis, nl RV, nl PASP.  Marland Kitchen Hx of repair of dissecting thoracic aortic aneurysm, [place] type A    a. 08/2010 s/p repair with AVR and VG->RCA; b. 01/2016 CTA: stable appearance of Asc Thoracic Aortic graft. Opacification of flase lumen of chronic abd Ao dissection w/ retrograde flow through lumbar arteries. No aneurysmal dil of flase lumen.  . Hyperlipidemia   . Hypertensive heart disease   . IBS  (irritable bowel syndrome)   . Internal hemorrhoids   . Marfan syndrome    pt denies  . Migraine   . Obstructive sleep apnea   . Osteoporosis   . Pneumonia   . RA (rheumatoid arthritis) ([place])    a. Followed by Dr. [name]    Past Surgical History:  Procedure Laterality Date  . ABDOMINAL AORTIC ANEURYSM REPAIR    . CARDIAC CATHETERIZATION  2001  . [place]  . CHOLECYSTECTOMY    . COLONOSCOPY    . FRACTURE SURGERY Right    shoulder replacement  . HEMORRHOID BANDING    . ORIF DISTAL RADIUS FRACTURE Left   . UPPER GASTROINTESTINAL ENDOSCOPY      There were no vitals filed for this visit.  Subjective Assessment - 11/07/18 0951    Subjective  Patient reports to therapy with increased stressed levels secondary to home and family life. States she had follow up with doctor regarding nausea symtpoms and reports that it is nerve related and she was cleared to continue therapy and "push through symptoms."Reports only mild back pain. Reports HEP compliance    Pertinent History  Patient is a pleasant 62 year old female who presents with cervical neck pain with radiating symptoms to bilateral fingers. 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  Has high stress levels due to caregiver support. Patient has gained 30 lb.  Has AAA and numbness began seven years ago when aneurysm occured. Sleeping with a neck pillow to relieve pain. Pain has been occurring for years.     Limitations  Sitting;Lifting;Standing;Walking;Writing;House hold activities;Other (comment);Reading    Currently in Pain?  Yes    Pain Score  1     Pain Orientation  Lower    Pain Descriptors / Indicators  Aching    Pain Type  Chronic pain    Pain Onset  More than a month ago        Manual  Prone:              CPAs and UPAs grade II-IV to thoracic and lumbar spine, 4x 10 seconds each level.Hypomobility throughout  with reduction with repetition.               Inferior SI mobilization grade III R side and central.             Roller to piriformis: R side 2 minutes              Roller to paraspinals L and R 2 minutes  Sideline  Roller to R gluteal muscles and IT band to reduce trigger points 4 minutes    Supine             Nerve glides via SLR with RLE and LLE on PT shoulder 20x      TherEx             Posterior pelvic tilt 15 x 5 second holds             GTB abduction 10x 3 second holds ; cues for keeping pelvis posteriorly rotated and core engaged               SLR with opp Le in hooklying; core engaged 10x. Verbal cues for proper technique and height of LE              Swiss ball: seated heel raises: cues for core activation 10x each LE             Swiss ball: GTB single arm row 12x each LE             Swiss ball: GTB bilateral arm row 10x    Swiss ball: marches x10 each LE  Swiss ball: LAQ x10 each LE   Seated pallof press 2x10 with GTB              Hand on knees sliding to hips with cueing for keeping shoulders down and scapular squeeze 15x                      PT Education - 11/07/18 0914    Education provided  Yes    Education Details  exercise technique, manual, core activation     Person(s) Educated  Patient    Methods  Explanation;Demonstration;Verbal cues    Comprehension  Verbalized understanding;Returned demonstration       PT Short Term Goals - 10/13/18 0937      PT SHORT TERM GOAL #1   Title  Patient will be independent in home exercise program to improve strength/mobility for better functional independence with ADLs.    Baseline  HEP compliance    Time  2    Period  Weeks    Status  Achieved      PT SHORT TERM GOAL #2   Title  Patient will report a worst pain of 5/10 on VAS in neck to improve tolerance with ADLs and reduced symptoms with activities.     Baseline  3/4: worst pain 8/10; 4/3: 6/10 4/30: 7/10 tension headache 5/30 7/10 when lifting husband from floor; 6/27: 5/10     Time  2     Period  Weeks    Status  Achieved      PT SHORT TERM GOAL #3   Title  Patient will report a worst pain of 5/10 on VAS in thoracic spine to improve tolerance with ADLs and reduced symptoms with activities.     Baseline  7/3: 8/10 7/26: 6/10 8/22: 5/10 9/26:  6/10 10/25: 4/10    Time  2    Period  Weeks    Status  Achieved        PT Long Term Goals - 10/13/18 0962      PT LONG TERM GOAL #1   Title  Patient will report a worst pain of 3/10 on VAS in cervical spine to improve tolerance with ADLs and reduced symptoms with activities    Baseline  worst pain 8/10; 4/3: 6/10 4/30: 7/10 tension headache from caregiver. 5/30: 7/10 from lifting husband; 6/27: 5/10; 7/26: 6/10 from stress 8/22: 1/10    Time  4    Period  Weeks    Status  Achieved      PT LONG TERM GOAL #2   Title  Patient will have no tingling in bilateral hands for two consecutive weeks to improve quality of life.     Baseline   4/3: had no tingling for about a week, came back this week 3x due to husband in the hospital. 4/30: 2x a week. 5/30: no tingling.     Time  4    Period  Weeks    Status  Achieved      PT LONG TERM GOAL #3   Title  Patient will decrease NDI score to 28% to decrease level of perceived disability to minimal.     Baseline  3/4: 46%; 4/3: 44%: 4/30: 54% 5/30: 40% 6/27: 32% 8/22: 22%    Time  4    Period  Weeks    Status  Achieved      PT LONG TERM GOAL #4   Title  Patient will improve cervical rotation ROM to 60 degrees bilaterally to improve functional mobility.     Baseline  3/4: R 39 L 41; 4/3: R 42 , L 46 4/30: supine L55 R 54 ; 5/30: seated L 55 R 56; 6/27: L 60 , R 56; 7/26: >60     Time  4    Period  Weeks    Status  Achieved      PT LONG TERM GOAL #5   Title  Patient will have reduced occurances of high levels of pain to improve quality of life    Baseline  initially was everyday now is 2x/week; 6/27: 2x/week ; 7/26: 2x/week 8/22: 2-3x/ week 9/26: 3x/week  10/25: 1x a day due to moving  mom in and caregiver duties    Time  4    Period  Weeks    Status  Partially Met    Target Date  11/10/18      PT LONG TERM GOAL #6   Title  Patient will report a worst pain of 3/10 on VAS in thoracic  spine to improve tolerance with ADLs and reduced symptoms with activities.     Baseline  7/3: 8/10 7/26: 6/10  8/22: 5/10 9/26: 6/10 10/25: 6/10    Time  4    Period  Weeks    Status  Partially Met    Target Date  11/10/18      PT LONG TERM GOAL #7   Title  Patient will improve thoracic mobility to allow patient to turn in seat when driving to see behind her for increased safety with driving.     Baseline  7/3: unable to perform ; 7/26: able to perform 8/22: R limited, L functional 9/26: slightly hypmobile both directions 10/25: slightly hypomobile both directions    Time  4    Period  Weeks    Status  Partially Met    Target Date  11/10/18      PT LONG TERM GOAL #8   Title  Patient will reduce modified Oswestry score to <20 as to demonstrate minimal disability with ADLs including improved sleeping tolerance, walking/sitting tolerance etc for better mobility with ADLs.     Baseline  7/26: 54%; 8/22: 52% 9/26: 60% 10/25: 42%    Time  4    Period  Weeks    Status  Partially Met    Target Date  11/10/18      PT LONG TERM GOAL  #9   TITLE  Patient will report a worst pain of 3/10 on VAS in lumbar spine to improve tolerance with ADLs and reduced symptoms with activities.     Baseline  9/26: 7/10  10/25: 6/10    Time  4    Period  Weeks    Status  Partially Met    Target Date  11/10/18            Plan - 11/07/18 1019    Clinical Impression Statement  Patient had trigger points in R gluteal muscles leading to pain and tightness into R LE. Subjective improvement in symptoms following manual therapy interventions specifically targeting gluteal musculature. Progressed swiss ball exercises for continued core strength and activation. Added in seated palloff press for core strength  which was tolerated well. Patient will continue to benefit from skilled physical therapy to improve strength, posture, and reduction of pain levels with ADLs.    Rehab Potential  Fair    PT Frequency  2x / week    PT Duration  4 weeks    PT Treatment/Interventions  ADLs/Self Care Home Management;Cryotherapy;Traction;Ultrasound;Therapeutic exercise;Therapeutic activities;Neuromuscular re-education;Patient/family education;Manual techniques;Passive range of motion;Dry needling;Energy conservation;Visual/perceptual remediation/compensation;Moist Heat;Iontophoresis 56m/ml Dexamethasone;Aquatic Therapy    PT Next Visit Plan  nerve glides, muscle tissue length, posture    PT Home Exercise Plan  HEP administered    Consulted and Agree with Plan of Care  Patient       Patient will benefit from skilled therapeutic intervention in order to improve the following deficits and impairments:  Decreased activity tolerance, Decreased safety awareness, Decreased range of motion, Decreased mobility, Decreased strength, Impaired flexibility, Postural dysfunction, Improper body mechanics, Pain, Hypomobility, Increased fascial restricitons, Increased muscle spasms  Visit Diagnosis: Pain in thoracic spine  Abnormal posture  Chronic midline low back pain, unspecified whether sciatica present     Problem List Patient Active Problem List   Diagnosis Date Noted  . Cough 09/08/2017  . [place] 04/23/[place]  . HPV (human papilloma virus) infection 12/01/2016  . Screening mammogram, encounter for 11/29/2016  . Estrogen  deficiency 11/29/2016  . Encounter for routine gynecological examination 11/29/2016  . Grade III hemorrhoids 10/06/2016  . Tachycardia 08/14/2016  . Hemorrhoids 08/13/2016  . Atypical migraine 08/03/2016  . CAD (coronary artery disease)   . AAA (abdominal aortic aneurysm) ([place])   . Chronic combined systolic (congestive) and diastolic (congestive) heart failure  ([place])   . Hyperglycemia 02/11/2016  . Generalized anxiety disorder 12/08/2015  . Panic attacks 07/28/2015  . Sensorineural hearing loss 03/27/2013  . Vitamin D deficiency 07/03/2012  . H/O aortic dissection 05/12/2012  . Obesity (BMI 30.0-34.9) 11/21/2011  . Routine general medical examination at a health care facility 09/20/2011  . S/P AVR (aortic valve replacement) 01/07/2011  . CORONARY ARTERY BYPASS GRAFT, HX OF 01/07/2011  . Arthritis 12/20/2010  . H/O dissecting abdominal aortic aneurysm repair 08/24/2010  . IRRITABLE BOWEL SYNDROME 09/09/2009  . Obstructive sleep apnea 08/04/2009  . External hemorrhoid 06/26/2009  . Osteoporosis 01/09/2009  . Hyperlipidemia 07/26/2007  . Depression with anxiety 07/26/2007  . Essential hypertension 07/26/2007  . Allergic rhinitis 07/26/2007  . Asthma 07/26/2007  . GERD 07/26/2007  . Osteoarthritis 07/26/2007  . Ocular migraine 07/26/2007  . Mild intermittent asthma without complication 40/69/8614   Erick Blinks, SPT  This entire session was performed under direct supervision and direction of a licensed therapist/therapist assistant . I have personally read, edited and approve of the note as written.  Janna Arch, PT, DPT   11/07/2018, 12:32 PM  [place] MAIN [facility] SERVICES [address] [place], [place], [address] Phone: [phone]   Fax:  [phone]  Name: Rachel Torres MRN: [id number] Date of Birth: [birth year]

## 2018-11-10 ENCOUNTER — Ambulatory Visit: Payer: Medicare HMO

## 2018-11-10 DIAGNOSIS — M546 Pain in thoracic spine: Secondary | ICD-10-CM

## 2018-11-10 DIAGNOSIS — G8929 Other chronic pain: Secondary | ICD-10-CM

## 2018-11-10 DIAGNOSIS — M545 Low back pain, unspecified: Secondary | ICD-10-CM

## 2018-11-10 DIAGNOSIS — R293 Abnormal posture: Secondary | ICD-10-CM | POA: Diagnosis not present

## 2018-11-10 NOTE — Therapy (Signed)
[place] MAIN [facility] SERVICES [address] [place], [place], [address] Phone: [phone]   Fax:  [phone]  Physical Therapy Treatment Physical Therapy Progress Note/Recert   Dates of reporting period 10/13/2018   to   11/10/2018  Patient Details  Name: Rachel Torres MRN: [id number] Date of Birth: 05/21/1956 Referring Provider (PT): [place] [facility]   Encounter Date: 11/10/2018  PT End of Session - 11/10/18 0949    Visit Number  53    Number of Visits  61    Date for PT Re-Evaluation  12/08/18    Authorization Type  9/10 PN start 10/25; (next session 1/10, starting 11/22)    PT Start Time  0950    PT Stop Time  1039    PT Time Calculation (min)  49 min    Activity Tolerance  Patient tolerated treatment well    Behavior During Therapy  [facility] for tasks assessed/performed       Past Medical History:  Diagnosis Date  . AAA (abdominal aortic aneurysm) ([place])    a. Chronic w/o evidence of aneurysmal dil on CTA 01/2016.  Marland Kitchen Allergy   . Anemia   . Anxiety   . Asthma   . CAD (coronary artery disease)    a. 08/2010 s/p CABG x 1 (VG->RCA) @ time of Ao dissection repair; b. 01/2016 Lexiscan MV: mid antsept/apical defect w/ ? peri-infarct ischemia-->likely attenuation-->Med Rx.  . Chronic combined systolic (congestive) and diastolic (congestive) heart failure ([place])    a. 2012 EF 30-35%; b. 04/2015 EF 40-45%; c. 01/2016 Echo: Ef 55-65%, nl AoV bioprosthesis, nl RV, nl PASP.  Marland Kitchen Depression   . Gallbladder sludge   . GERD (gastroesophageal reflux disease)   . History of Bicuspid Aortic Valve    a. 08/2010 s/p AVR @ time of Ao dissection repair; b. 01/2016 Echo: Ef 55-65%, nl AoV bioprosthesis, nl RV, nl PASP.  Marland Kitchen Hx of repair of dissecting thoracic aortic aneurysm, [place] type A    a. 08/2010 s/p repair with AVR and VG->RCA; b. 01/2016 CTA: stable appearance of Asc Thoracic Aortic graft. Opacification of flase lumen of chronic abd Ao dissection w/  retrograde flow through lumbar arteries. No aneurysmal dil of flase lumen.  . Hyperlipidemia   . Hypertensive heart disease   . IBS (irritable bowel syndrome)   . Internal hemorrhoids   . Marfan syndrome    pt denies  . Migraine   . Obstructive sleep apnea   . Osteoporosis   . Pneumonia   . RA (rheumatoid arthritis) ([place])    a. Followed by Dr. [name]    Past Surgical History:  Procedure Laterality Date  . ABDOMINAL AORTIC ANEURYSM REPAIR    . CARDIAC CATHETERIZATION  2001  . [place]  . CHOLECYSTECTOMY    . COLONOSCOPY    . FRACTURE SURGERY Right    shoulder replacement  . HEMORRHOID BANDING    . ORIF DISTAL RADIUS FRACTURE Left   . UPPER GASTROINTESTINAL ENDOSCOPY      There were no vitals filed for this visit.  Goal Assessment days of high level pain: 1-2 days/week, low back still hurts with IADL's Worst pain:  5/10 (low back, IT band), mid thoracic 7-8/10 (feels this is related to blood flow because muscles are "working better") Pt feels pain is higher because pt has become more active, acting as a caregiver for her husband and because she has been feeling better/stronger in general. Turn to drive:  WFL MODI: 40% VAS: 5/10 low back and 7-8/10 thoracic  0/10 pain reported at initiation of session   Treat: Prone: CPA/UPA, mobilizations to T3-L5, grade 2-3 for 2x30 sec per vertebrae with pt reporting more tenderness throughout thoracic area.  PT noted decreased hypomobility in lumbar spine.  Roller to lumbar and thoracic paraspinals, piriformis x4 min  Sidelying:  R side up: roller to IT band, piriformis and gluteal musculature  Supine:   Figure four stretch for assessment only; pt reported no pain with improved mobility. SLR with TrA contraction x10 B LE with VC's for core activation. Posterior pelvic tilts with 3 sec hold x10 Hip abduction with 3 sec hold GTB  Hooklying adduction squeezes with soccer ball 3 sec hold  Seated on silver  Swiss Ball:  Sitting marches with focus on core contraction for stabilization x10 LAQ's x10 Double arm row with GTB x10 Single arm row with GTB x10  paloff press GTB x10 bilaterally   Patient's condition has the potential to improve in response to therapy. Maximum improvement is yet to be obtained. The anticipated improvement is attainable and reasonable in a generally predictable time.  Patient reports compliance with HEP management, decreased pain and that she feels her back is becoming stronger.                              PT Short Term Goals - 11/10/18 1008      PT SHORT TERM GOAL #1   Title  Patient will be independent in home exercise program to improve strength/mobility for better functional independence with ADLs.    Baseline  HEP compliance    Time  2    Period  Weeks    Status  Achieved      PT SHORT TERM GOAL #2   Title  Patient will report a worst pain of 5/10 on VAS in neck to improve tolerance with ADLs and reduced symptoms with activities.     Baseline  3/4: worst pain 8/10; 4/3: 6/10 4/30: 7/10 tension headache 5/30 7/10 when lifting husband from floor; 6/27: 5/10     Time  2    Period  Weeks    Status  Achieved      PT SHORT TERM GOAL #3   Title  Patient will report a worst pain of 5/10 on VAS in thoracic spine to improve tolerance with ADLs and reduced symptoms with activities.     Baseline  7/3: 8/10 7/26: 6/10 8/22: 5/10 9/26:  6/10 10/25: 4/10    Time  2    Period  Weeks    Status  Achieved        PT Long Term Goals - 11/10/18 1009      PT LONG TERM GOAL #1   Title  Patient will report a worst pain of 3/10 on VAS in cervical spine to improve tolerance with ADLs and reduced symptoms with activities    Baseline  worst pain 8/10; 4/3: 6/10 4/30: 7/10 tension headache from caregiver. 5/30: 7/10 from lifting husband; 6/27: 5/10; 7/26: 6/10 from stress 8/22: 1/10    Time  4    Period  Weeks    Status  Achieved      PT LONG TERM  GOAL #2   Title  Patient will have no tingling in bilateral hands for two consecutive weeks to improve quality of life.     Baseline   4/3: had no tingling  for about a week, came back this week 3x due to husband in the hospital. 4/30: 2x a week. 5/30: no tingling.     Time  4    Period  Weeks    Status  Achieved      PT LONG TERM GOAL #3   Title  Patient will decrease NDI score to 28% to decrease level of perceived disability to minimal.     Baseline  3/4: 46%; 4/3: 44%: 4/30: 54% 5/30: 40% 6/27: 32% 8/22: 22%    Time  4    Period  Weeks    Status  Achieved      PT LONG TERM GOAL #4   Title  Patient will improve cervical rotation ROM to 60 degrees bilaterally to improve functional mobility.     Baseline  3/4: R 39 L 41; 4/3: R 42 , L 46 4/30: supine L55 R 54 ; 5/30: seated L 55 R 56; 6/27: L 60 , R 56; 7/26: >60     Time  4    Period  Weeks    Status  Achieved      PT LONG TERM GOAL #5   Title  Patient will have reduced occurances of high levels of pain to improve quality of life    Baseline  initially was everyday now is 2x/week; 6/27: 2x/week ; 7/26: 2x/week 8/22: 2-3x/ week 9/26: 3x/week  10/25: 1x a day due to moving mom in and caregiver duties.  11/10/2018 1-2 days/wk.    Time  4    Period  Weeks    Status  Partially Met    Target Date  12/08/18      PT LONG TERM GOAL #6   Title  Patient will report a worst pain of 3/10 on VAS in thoracic spine to improve tolerance with ADLs and reduced symptoms with activities.     Baseline  7/3: 8/10 7/26: 6/10  8/22: 5/10 9/26: 6/10 10/25: 6/10, 11/22: 7-8/10 due to increased caregiver activity    Time  4    Period  Weeks    Status  Partially Met    Target Date  12/08/18      PT LONG TERM GOAL #7   Title  Patient will improve thoracic mobility to allow patient to turn in seat when driving to see behind her for increased safety with driving.     Baseline  7/3: unable to perform ; 7/26: able to perform 8/22: R limited, L functional 9/26:  slightly hypmobile both directions 10/25: slightly hypomobile both directions; 11/22: Pt achieved [facility] cervical rotation.    Time  4    Period  Weeks    Status  Achieved      PT LONG TERM GOAL #8   Title  Patient will reduce modified Oswestry score to <20 as to demonstrate minimal disability with ADLs including improved sleeping tolerance, walking/sitting tolerance etc for better mobility with ADLs.     Baseline  7/26: 54%; 8/22: 52% 9/26: 60% 10/25: 42%, 11/22: 40%    Time  4    Period  Weeks    Status  Partially Met    Target Date  12/08/18      PT LONG TERM GOAL  #9   TITLE  Patient will report a worst pain of 3/10 on VAS in lumbar spine to improve tolerance with ADLs and reduced symptoms with activities.     Baseline  9/26: 7/10  10/25: 6/10, 11/22: 5/10    Time  4    Period  Weeks    Status  Partially Met    Target Date  12/08/18            Plan - 11/10/18 1045    Clinical Impression Statement  Patient presented to therapy today with decreased low back pain and slightly elevated thoracic pain due to reported increased caregiver burden.  She demonstrated improvement in functional thoracic rotation and reported feeling that her back is "stronger".  Pt responded well to CPA/UPA mobilization and presented with increased mobility of lumbar spine.  Pt reports compliance with HEP and demonstrated good tolerance of all there ex with 0/10 pain reported at termination of treatment.  Patient still to demonstrate improvement in thoracic pain and MODI score. Patient's condition has the potential to improve in response to therapy. Maximum improvement is yet to be obtained. The anticipated improvement is attainable and reasonable in a generally predictable time.  She will benefit from recertification for 4 more weeks of therapy to address these issues and to allow for pt to progress to independence with HEP management.    Rehab Potential  Fair    PT Frequency  2x / week    PT Duration  4 weeks     PT Treatment/Interventions  ADLs/Self Care Home Management;Cryotherapy;Traction;Ultrasound;Therapeutic exercise;Therapeutic activities;Neuromuscular re-education;Patient/family education;Manual techniques;Passive range of motion;Dry needling;Energy conservation;Visual/perceptual remediation/compensation;Moist Heat;Iontophoresis 76m/ml Dexamethasone;Aquatic Therapy;Stair training    PT Next Visit Plan  nerve glides, muscle tissue length, posture    PT Home Exercise Plan  HEP administered    Consulted and Agree with Plan of Care  Patient       Patient will benefit from skilled therapeutic intervention in order to improve the following deficits and impairments:  Decreased activity tolerance, Decreased safety awareness, Decreased range of motion, Decreased mobility, Decreased strength, Impaired flexibility, Postural dysfunction, Improper body mechanics, Pain, Hypomobility, Increased fascial restricitons, Increased muscle spasms  Visit Diagnosis: Pain in thoracic spine  Abnormal posture  Chronic midline low back pain, unspecified whether sciatica present     Problem List Patient Active Problem List   Diagnosis Date Noted  . Cough 09/08/2017  . [place] 1 [place] of [place] ([place] 04/23/[place]  . HPV (human papilloma virus) infection 12/01/2016  . Screening mammogram, encounter for 11/29/2016  . Estrogen deficiency 11/29/2016  . Encounter for routine gynecological examination 11/29/2016  . Grade III hemorrhoids 10/06/2016  . Tachycardia 08/14/2016  . Hemorrhoids 08/13/2016  . Atypical migraine 08/03/2016  . CAD (coronary artery disease)   . AAA ([place]dominal aortic aneurysm) ([place]   . Chronic combined systolic (congestive) and diastolic ([place]ive) heart failure ([place]   . Hyperglycemia 02/11/2016  . Generalized anxiety disorder 12/08/2015  . Panic attacks 07/28/2015  . Sensorineural hearing loss 03/27/2013  . Vitamin D deficiency 07/03/2012  . H/O aortic dissection 05/12/2012   . Obesity (BMI 30.0-34.9) 11/21/2011  . Routine general medical examination at a health care facility 09/20/2011  . S/P AVR (aortic valve replacement) 01/07/2011  . CORONARY ARTERY BYPASS GRAFT, HX OF 01/07/2011  . Arthritis 12/20/2010  . H/O dissecting abdominal aortic aneurysm repair 08/24/2010  . IRRITABLE BOWEL SYNDROME 09/09/2009  . Obstructive sleep apnea 08/04/2009  . External hemorrhoid 06/26/2009  . Osteoporosis 01/09/2009  . Hyperlipidemia 07/26/2007  . Depression with anxiety 07/26/2007  . Essential hypertension 07/26/2007  . Allergic rhinitis 07/26/2007  . Asthma 07/26/2007  . GERD 07/26/2007  . Osteoarthritis 07/26/2007  . Ocular migraine 07/26/2007  . Mild intermittent asthma without complication [id number] [facility]  Ermalene Postin, PT, DPT   11/10/2018, 10:48 AM  [place] MAIN [facility] SERVICES [address] [place], [place], [address] Phone: [phone]   Fax:  [phone]  Name: Rachel Torres MRN: [id number] Date of Birth: 1956/01/13

## 2018-11-13 ENCOUNTER — Ambulatory Visit: Payer: Medicare HMO

## 2018-11-13 DIAGNOSIS — M546 Pain in thoracic spine: Secondary | ICD-10-CM

## 2018-11-13 DIAGNOSIS — G8929 Other chronic pain: Secondary | ICD-10-CM | POA: Diagnosis not present

## 2018-11-13 DIAGNOSIS — M545 Low back pain, unspecified: Secondary | ICD-10-CM

## 2018-11-13 DIAGNOSIS — R293 Abnormal posture: Secondary | ICD-10-CM | POA: Diagnosis not present

## 2018-11-13 NOTE — Therapy (Addendum)
[place] MAIN [facility] SERVICES [address] [place], [place], [address] Phone: [phone]   Fax:  [phone]  Physical Therapy Treatment  Patient Details  Name: Rachel Torres MRN: [id number] Date of Birth: 13-Aug-1956 Referring Provider (PT): [place] [facility]   Encounter Date: 11/13/2018  PT End of Session - 11/13/18 0927    Visit Number  9    Number of Visits  61    Date for PT Re-Evaluation  12/08/18    Authorization Type  1/10 PN starting 11/22    PT Start Time  0931    PT Stop Time  1015    PT Time Calculation (min)  44 min    Activity Tolerance  Patient tolerated treatment well    Behavior During Therapy  [facility] for tasks assessed/performed       Past Medical History:  Diagnosis Date  . AAA (abdominal aortic aneurysm) ([place])    a. Chronic w/o evidence of aneurysmal dil on CTA 01/2016.  Marland Kitchen Allergy   . Anemia   . Anxiety   . Asthma   . CAD (coronary artery disease)    a. 08/2010 s/p CABG x 1 (VG->RCA) @ time of Ao dissection repair; b. 01/2016 Lexiscan MV: mid antsept/apical defect w/ ? peri-infarct ischemia-->likely attenuation-->Med Rx.  . Chronic combined systolic (congestive) and diastolic (congestive) heart failure ([place])    a. 2012 EF 30-35%; b. 04/2015 EF 40-45%; c. 01/2016 Echo: Ef 55-65%, nl AoV bioprosthesis, nl RV, nl PASP.  Marland Kitchen Depression   . Gallbladder sludge   . GERD (gastroesophageal reflux disease)   . History of Bicuspid Aortic Valve    a. 08/2010 s/p AVR @ time of Ao dissection repair; b. 01/2016 Echo: Ef 55-65%, nl AoV bioprosthesis, nl RV, nl PASP.  Marland Kitchen Hx of repair of dissecting thoracic aortic aneurysm, [place] type A    a. 08/2010 s/p repair with AVR and VG->RCA; b. 01/2016 CTA: stable appearance of Asc Thoracic Aortic graft. Opacification of flase lumen of chronic abd Ao dissection w/ retrograde flow through lumbar arteries. No aneurysmal dil of flase lumen.  . Hyperlipidemia   . Hypertensive heart disease   . IBS  (irritable bowel syndrome)   . Internal hemorrhoids   . Marfan syndrome    pt denies  . Migraine   . Obstructive sleep apnea   . Osteoporosis   . Pneumonia   . RA (rheumatoid arthritis) ([place])    a. Followed by Dr. [name]    Past Surgical History:  Procedure Laterality Date  . ABDOMINAL AORTIC ANEURYSM REPAIR    . CARDIAC CATHETERIZATION  2001  . [place]  . CHOLECYSTECTOMY    . COLONOSCOPY    . FRACTURE SURGERY Right    shoulder replacement  . HEMORRHOID BANDING    . ORIF DISTAL RADIUS FRACTURE Left   . UPPER GASTROINTESTINAL ENDOSCOPY      There were no vitals filed for this visit.  Subjective Assessment - 11/13/18 0935    Subjective  Patient states her back is doing pretty good. States she clearned out closet this weekend with moderate increases in pain (5-6/10). Patient states she is still focusing on engaging core with household activities.    Pertinent History  Patient is a pleasant 62 year old female who presents with cervical neck pain with radiating symptoms to bilateral fingers. 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  Has high stress levels due to caregiver  support. Patient has gained 30 lb.  Has AAA and numbness began seven years ago when aneurysm occured. Sleeping with a neck pillow to relieve pain. Pain has been occurring for years.     Limitations  Sitting;Lifting;Standing;Walking;Writing;House hold activities;Other (comment);Reading    Currently in Pain?  Yes    Pain Score  2     Pain Location  Back    Pain Orientation  Lower    Pain Descriptors / Indicators  Aching    Pain Type  Chronic pain    Pain Onset  More than a month ago           Manual  Prone:              CPAs and UPAs grade II-IV to thoracic and lumbar spine, 4x 10 seconds each level.Hypomobility throughout  with reduction with repetition.              Roller to piriformis: R side 2 minutes              Roller to paraspinals L  and R 2 minutes   Sideline (R side up)             Roller to R gluteal muscles, piriformis and IT band to reduce trigger points x4 minutes. Instructed patient on using lacrosse ball for pain relief and reduction of trigger point    Supine             Nerve glides via SLR with RLE and LLE on PT shoulder 20x      TherEx             Posterior pelvic tilts during rest breaks in supine             GTB abduction 10x 3 second holds ; cues for keeping pelvis posteriorly rotated and core engaged               SLR with opp Le in hooklying; core engaged 10x. Verbal cues for proper technique and height of LE. Verbal to slowly lower LE  Bridge dogs x10 with verbal cues to engage core. Verbal cues to keep hips squared to table. Tactile cues for pelvic position  Swiss ball: marches terminated due to onset of pain across back that did not change with pelvic position or core engagement. Pain decreased once seated on table  Seated pallof press x10 with GTB bilaterally, cw/cww x10 with GTB bilaterally   Seated rows BUE with GTB x10 with 3 second holds. Verbal cues to squeeze shoulder blades.               Hand on knees sliding to hips with cueing for keeping shoulders down and scapular squeeze 15x                       PT Education - 11/13/18 0927    Education provided  Yes    Education Details  exercise technique, manual, core activation    Person(s) Educated  Patient    Methods  Explanation;Demonstration;Verbal cues    Comprehension  Verbalized understanding;Returned demonstration       PT Short Term Goals - 11/10/18 1008      PT SHORT TERM GOAL #1   Title  Patient will be independent in home exercise program to improve strength/mobility for better functional independence with ADLs.    Baseline  HEP compliance    Time  2    Period  Weeks  Status  Achieved      PT SHORT TERM GOAL #2   Title  Patient will report a worst pain of 5/10 on VAS in neck to improve tolerance  with ADLs and reduced symptoms with activities.     Baseline  3/4: worst pain 8/10; 4/3: 6/10 4/30: 7/10 tension headache 5/30 7/10 when lifting husband from floor; 6/27: 5/10     Time  2    Period  Weeks    Status  Achieved      PT SHORT TERM GOAL #3   Title  Patient will report a worst pain of 5/10 on VAS in thoracic spine to improve tolerance with ADLs and reduced symptoms with activities.     Baseline  7/3: 8/10 7/26: 6/10 8/22: 5/10 9/26:  6/10 10/25: 4/10    Time  2    Period  Weeks    Status  Achieved        PT Long Term Goals - 11/10/18 1009      PT LONG TERM GOAL #1   Title  Patient will report a worst pain of 3/10 on VAS in cervical spine to improve tolerance with ADLs and reduced symptoms with activities    Baseline  worst pain 8/10; 4/3: 6/10 4/30: 7/10 tension headache from caregiver. 5/30: 7/10 from lifting husband; 6/27: 5/10; 7/26: 6/10 from stress 8/22: 1/10    Time  4    Period  Weeks    Status  Achieved      PT LONG TERM GOAL #2   Title  Patient will have no tingling in bilateral hands for two consecutive weeks to improve quality of life.     Baseline   4/3: had no tingling for about a week, came back this week 3x due to husband in the hospital. 4/30: 2x a week. 5/30: no tingling.     Time  4    Period  Weeks    Status  Achieved      PT LONG TERM GOAL #3   Title  Patient will decrease NDI score to 28% to decrease level of perceived disability to minimal.     Baseline  3/4: 46%; 4/3: 44%: 4/30: 54% 5/30: 40% 6/27: 32% 8/22: 22%    Time  4    Period  Weeks    Status  Achieved      PT LONG TERM GOAL #4   Title  Patient will improve cervical rotation ROM to 60 degrees bilaterally to improve functional mobility.     Baseline  3/4: R 39 L 41; 4/3: R 42 , L 46 4/30: supine L55 R 54 ; 5/30: seated L 55 R 56; 6/27: L 60 , R 56; 7/26: >60     Time  4    Period  Weeks    Status  Achieved      PT LONG TERM GOAL #5   Title  Patient will have reduced occurances of  high levels of pain to improve quality of life    Baseline  initially was everyday now is 2x/week; 6/27: 2x/week ; 7/26: 2x/week 8/22: 2-3x/ week 9/26: 3x/week  10/25: 1x a day due to moving mom in and caregiver duties.  11/10/2018 1-2 days/wk.    Time  4    Period  Weeks    Status  Partially Met    Target Date  12/08/18      PT LONG TERM GOAL #6   Title  Patient will report a worst pain  of 3/10 on VAS in thoracic spine to improve tolerance with ADLs and reduced symptoms with activities.     Baseline  7/3: 8/10 7/26: 6/10  8/22: 5/10 9/26: 6/10 10/25: 6/10, 11/22: 7-8/10 due to increased caregiver activity    Time  4    Period  Weeks    Status  Partially Met    Target Date  12/08/18      PT LONG TERM GOAL #7   Title  Patient will improve thoracic mobility to allow patient to turn in seat when driving to see behind her for increased safety with driving.     Baseline  7/3: unable to perform ; 7/26: able to perform 8/22: R limited, L functional 9/26: slightly hypmobile both directions 10/25: slightly hypomobile both directions; 11/22: Pt achieved [facility] cervical rotation.    Time  4    Period  Weeks    Status  Achieved      PT LONG TERM GOAL #8   Title  Patient will reduce modified Oswestry score to <20 as to demonstrate minimal disability with ADLs including improved sleeping tolerance, walking/sitting tolerance etc for better mobility with ADLs.     Baseline  7/26: 54%; 8/22: 52% 9/26: 60% 10/25: 42%, 11/22: 40%    Time  4    Period  Weeks    Status  Partially Met    Target Date  12/08/18      PT LONG TERM GOAL  #9   TITLE  Patient will report a worst pain of 3/10 on VAS in lumbar spine to improve tolerance with ADLs and reduced symptoms with activities.     Baseline  9/26: 7/10  10/25: 6/10, 11/22: 5/10    Time  4    Period  Weeks    Status  Partially Met    Target Date  12/08/18            Plan - 11/13/18 1020    Clinical Impression Statement  Patient demonstrated improved  ability to maintain TrA contraction during supine stabilization exercises demonstrating improved muscular recruitment and activation. Patient had  increased difficulty with bird dog exercises requiring cueing for proper technique. Terminated swiss ball exercises due to onset of pain with no change with pelvic position or core engagement. Patient denied pain at end of session. Patient instructed using a lacrosse ball or tennis ball to rollout trigger points in R gluteal musculature, piriformis, and IT band. Patient will continue to benefit from skilled physical therapy to improve posture, strength, and reduce pain levels with ADLs.     Rehab Potential  Fair    PT Frequency  2x / week    PT Duration  4 weeks    PT Treatment/Interventions  ADLs/Self Care Home Management;Cryotherapy;Traction;Ultrasound;Therapeutic exercise;Therapeutic activities;Neuromuscular re-education;Patient/family education;Manual techniques;Passive range of motion;Dry needling;Energy conservation;Visual/perceptual remediation/compensation;Moist Heat;Iontophoresis 3m/ml Dexamethasone;Aquatic Therapy;Stair training    PT Next Visit Plan  nerve glides, muscle tissue length, posture    PT Home Exercise Plan  HEP administered    Consulted and Agree with Plan of Care  Patient       Patient will benefit from skilled therapeutic intervention in order to improve the following deficits and impairments:  Decreased activity tolerance, Decreased safety awareness, Decreased range of motion, Decreased mobility, Decreased strength, Impaired flexibility, Postural dysfunction, Improper body mechanics, Pain, Hypomobility, Increased fascial restricitons, Increased muscle spasms  Visit Diagnosis: Pain in thoracic spine  Abnormal posture  Chronic midline low back pain, unspecified whether sciatica present  Problem List Patient Active Problem List   Diagnosis Date Noted  . Cough 09/08/2017  . Type 1 dissection of ascending aorta ([place])  04/23/2017  . HPV (human papilloma virus) infection 12/01/2016  . Screening mammogram, encounter for 11/29/2016  . Estrogen deficiency 11/29/2016  . Encounter for routine gynecological examination 11/29/2016  . Grade III hemorrhoids 10/06/2016  . Tachycardia 08/14/2016  . Hemorrhoids 08/13/2016  . Atypical migraine 08/03/2016  . CAD (coronary artery disease)   . AAA (abdominal aortic aneurysm) ([place])   . Chronic combined systolic (congestive) and diastolic (congestive) heart failure ([place])   . Hyperglycemia 02/11/2016  . Generalized anxiety disorder 12/08/2015  . Panic attacks 07/28/2015  . Sensorineural hearing loss 03/27/2013  . Vitamin D deficiency 07/03/2012  . H/O aortic dissection 05/12/2012  . Obesity (BMI 30.0-34.9) 11/21/2011  . Routine general medical examination at a health care facility 09/20/2011  . S/P AVR (aortic valve replacement) 01/07/2011  . CORONARY ARTERY BYPASS GRAFT, HX OF 01/07/2011  . Arthritis 12/20/2010  . H/O dissecting abdominal aortic aneurysm repair 08/24/2010  . IRRITABLE BOWEL SYNDROME 09/09/2009  . Obstructive sleep apnea 08/04/2009  . External hemorrhoid 06/26/2009  . Osteoporosis 01/09/2009  . Hyperlipidemia 07/26/2007  . Depression with anxiety 07/26/2007  . Essential hypertension 07/26/2007  . Allergic rhinitis 07/26/2007  . Asthma 07/26/2007  . GERD 07/26/2007  . Osteoarthritis 07/26/2007  . Ocular migraine 07/26/2007  . Mild intermittent asthma without complication 60/09/9322   Erick Blinks, SPT  This entire session was performed under direct supervision and direction of a licensed therapist/therapist assistant . I have personally read, edited and approve of the note as written.  Janna Arch, PT, DPT   11/13/2018, 10:41 AM  [place] MAIN [facility] SERVICES [address] [place], [place], [address] Phone: [phone]   Fax:  [phone]  Name: Rachel Torres MRN: [id number] Date of Birth:  [birth year]

## 2018-11-14 ENCOUNTER — Ambulatory Visit (INDEPENDENT_AMBULATORY_CARE_PROVIDER_SITE_OTHER): Payer: Medicare HMO | Admitting: Psychology

## 2018-11-14 DIAGNOSIS — F4323 Adjustment disorder with mixed anxiety and depressed mood: Secondary | ICD-10-CM | POA: Diagnosis not present

## 2018-11-15 ENCOUNTER — Ambulatory Visit: Payer: Medicare HMO

## 2018-11-15 DIAGNOSIS — M546 Pain in thoracic spine: Secondary | ICD-10-CM

## 2018-11-15 DIAGNOSIS — R293 Abnormal posture: Secondary | ICD-10-CM

## 2018-11-15 DIAGNOSIS — M545 Low back pain, unspecified: Secondary | ICD-10-CM

## 2018-11-15 DIAGNOSIS — G8929 Other chronic pain: Secondary | ICD-10-CM

## 2018-11-15 NOTE — Therapy (Signed)
[place] MAIN [facility] SERVICES [address] [place], [place], [address] Phone: [phone]   Fax:  [phone]  Physical Therapy Treatment  Patient Details  Name: Rachel Torres MRN: [id number] Date of Birth: 1956/07/18 Referring Provider (PT): [place] [facility]   Encounter Date: 11/15/2018  PT End of Session - 11/15/18 1006    Visit Number  55    Number of Visits  61    Date for PT Re-Evaluation  12/08/18    Authorization Type  2/10 PN starting 11/22    PT Start Time  0945    PT Stop Time  1030    PT Time Calculation (min)  45 min    Activity Tolerance  Patient tolerated treatment well    Behavior During Therapy  [facility] for tasks assessed/performed       Past Medical History:  Diagnosis Date  . AAA (abdominal aortic aneurysm) ([place])    a. Chronic w/o evidence of aneurysmal dil on CTA 01/2016.  Marland Kitchen Allergy   . Anemia   . Anxiety   . Asthma   . CAD (coronary artery disease)    a. 08/2010 s/p CABG x 1 (VG->RCA) @ time of Ao dissection repair; b. 01/2016 Lexiscan MV: mid antsept/apical defect w/ ? peri-infarct ischemia-->likely attenuation-->Med Rx.  . Chronic combined systolic (congestive) and diastolic (congestive) heart failure ([place])    a. 2012 EF 30-35%; b. 04/2015 EF 40-45%; c. 01/2016 Echo: Ef 55-65%, nl AoV bioprosthesis, nl RV, nl PASP.  Marland Kitchen Depression   . Gallbladder sludge   . GERD (gastroesophageal reflux disease)   . History of Bicuspid Aortic Valve    a. 08/2010 s/p AVR @ time of Ao dissection repair; b. 01/2016 Echo: Ef 55-65%, nl AoV bioprosthesis, nl RV, nl PASP.  Marland Kitchen Hx of repair of dissecting thoracic aortic aneurysm, [place] type A    a. 08/2010 s/p repair with AVR and VG->RCA; b. 01/2016 CTA: stable appearance of Asc Thoracic Aortic graft. Opacification of flase lumen of chronic abd Ao dissection w/ retrograde flow through lumbar arteries. No aneurysmal dil of flase lumen.  . Hyperlipidemia   . Hypertensive heart disease   . IBS  (irritable bowel syndrome)   . Internal hemorrhoids   . Marfan syndrome    pt denies  . Migraine   . Obstructive sleep apnea   . Osteoporosis   . Pneumonia   . RA (rheumatoid arthritis) ([place])    a. Followed by Dr. [name]    Past Surgical History:  Procedure Laterality Date  . ABDOMINAL AORTIC ANEURYSM REPAIR    . CARDIAC CATHETERIZATION  2001  . [place]  . CHOLECYSTECTOMY    . COLONOSCOPY    . FRACTURE SURGERY Right    shoulder replacement  . HEMORRHOID BANDING    . ORIF DISTAL RADIUS FRACTURE Left   . UPPER GASTROINTESTINAL ENDOSCOPY      There were no vitals filed for this visit.  Subjective Assessment - 11/15/18 0947    Subjective  Patient has had a migraine last night and today. Patient has lower back pain since doing her HEP.     Pertinent History  Patient is a pleasant 62 year old female who presents with cervical neck pain with radiating symptoms to bilateral fingers. 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  Has high stress levels due to caregiver support. Patient has gained 30 lb.  Has AAA and numbness began seven years  ago when aneurysm occured. Sleeping with a neck pillow to relieve pain. Pain has been occurring for years.     Limitations  Sitting;Lifting;Standing;Walking;Writing;House hold activities;Other (comment);Reading    Currently in Pain?  Yes    Pain Score  2     Pain Location  Back    Pain Orientation  Lower;Mid    Pain Descriptors / Indicators  Aching    Pain Type  Chronic pain    Pain Onset  More than a month ago    Pain Frequency  Constant        Manual  Prone:              CPAs and UPAs grade II-IV to thoracic and lumbar spine, 4x 10 seconds each level.Hypomobility throughout  with reduction with repetition.              Roller to piriformis: R side 2 minutes              Roller to paraspinals L and R 2 minutes   Sideline (R side up)             Roller to R gluteal muscles,  piriformis and IT band to reduce trigger points x4 minutes. Instructed patient on using lacrosse ball for pain relief and reduction of trigger point    Supine             Nerve glides via SLR with RLE and LLE on PT shoulder 20x      TherEx             Posterior pelvic tilts 5 second holds during rest breaks in supine             GTB abduction 10x 3 second holds ; cues for keeping pelvis posteriorly rotated and core engaged               SLR with opp Le in hooklying; core engaged 10x. Verbal cues for proper technique and height of LE. Verbal to slowly lower LE    Vacuum: introduction into proper technique to reduce torsion to spine; increased pain to 4/10  Supine TrA activation with marches.    Seated             Hand on knees sliding to hips with cueing for keeping shoulders down and scapular squeeze 15x  GTB Straight arm lat pull down 15x   Seated hamstring stretch 2x 60 seconds                        PT Education - 11/15/18 1006    Education provided  Yes    Education Details  exercise techinque, manual, core activation     Person(s) Educated  Patient    Methods  Explanation;Demonstration    Comprehension  Verbalized understanding;Returned demonstration       PT Short Term Goals - 11/10/18 1008      PT SHORT TERM GOAL #1   Title  Patient will be independent in home exercise program to improve strength/mobility for better functional independence with ADLs.    Baseline  HEP compliance    Time  2    Period  Weeks    Status  Achieved      PT SHORT TERM GOAL #2   Title  Patient will report a worst pain of 5/10 on VAS in neck to improve tolerance with ADLs and reduced symptoms with activities.     Baseline  3/4: worst  pain 8/10; 4/3: 6/10 4/30: 7/10 tension headache 5/30 7/10 when lifting husband from floor; 6/27: 5/10     Time  2    Period  Weeks    Status  Achieved      PT SHORT TERM GOAL #3   Title  Patient will report a worst pain of 5/10 on VAS in  thoracic spine to improve tolerance with ADLs and reduced symptoms with activities.     Baseline  7/3: 8/10 7/26: 6/10 8/22: 5/10 9/26:  6/10 10/25: 4/10    Time  2    Period  Weeks    Status  Achieved        PT Long Term Goals - 11/10/18 1009      PT LONG TERM GOAL #1   Title  Patient will report a worst pain of 3/10 on VAS in cervical spine to improve tolerance with ADLs and reduced symptoms with activities    Baseline  worst pain 8/10; 4/3: 6/10 4/30: 7/10 tension headache from caregiver. 5/30: 7/10 from lifting husband; 6/27: 5/10; 7/26: 6/10 from stress 8/22: 1/10    Time  4    Period  Weeks    Status  Achieved      PT LONG TERM GOAL #2   Title  Patient will have no tingling in bilateral hands for two consecutive weeks to improve quality of life.     Baseline   4/3: had no tingling for about a week, came back this week 3x due to husband in the hospital. 4/30: 2x a week. 5/30: no tingling.     Time  4    Period  Weeks    Status  Achieved      PT LONG TERM GOAL #3   Title  Patient will decrease NDI score to 28% to decrease level of perceived disability to minimal.     Baseline  3/4: 46%; 4/3: 44%: 4/30: 54% 5/30: 40% 6/27: 32% 8/22: 22%    Time  4    Period  Weeks    Status  Achieved      PT LONG TERM GOAL #4   Title  Patient will improve cervical rotation ROM to 60 degrees bilaterally to improve functional mobility.     Baseline  3/4: R 39 L 41; 4/3: R 42 , L 46 4/30: supine L55 R 54 ; 5/30: seated L 55 R 56; 6/27: L 60 , R 56; 7/26: >60     Time  4    Period  Weeks    Status  Achieved      PT LONG TERM GOAL #5   Title  Patient will have reduced occurances of high levels of pain to improve quality of life    Baseline  initially was everyday now is 2x/week; 6/27: 2x/week ; 7/26: 2x/week 8/22: 2-3x/ week 9/26: 3x/week  10/25: 1x a day due to moving mom in and caregiver duties.  11/10/2018 1-2 days/wk.    Time  4    Period  Weeks    Status  Partially Met    Target Date   12/08/18      PT LONG TERM GOAL #6   Title  Patient will report a worst pain of 3/10 on VAS in thoracic spine to improve tolerance with ADLs and reduced symptoms with activities.     Baseline  7/3: 8/10 7/26: 6/10  8/22: 5/10 9/26: 6/10 10/25: 6/10, 11/22: 7-8/10 due to increased caregiver activity    Time  4  Period  Weeks    Status  Partially Met    Target Date  12/08/18      PT LONG TERM GOAL #7   Title  Patient will improve thoracic mobility to allow patient to turn in seat when driving to see behind her for increased safety with driving.     Baseline  7/3: unable to perform ; 7/26: able to perform 8/22: R limited, L functional 9/26: slightly hypmobile both directions 10/25: slightly hypomobile both directions; 11/22: Pt achieved [facility] cervical rotation.    Time  4    Period  Weeks    Status  Achieved      PT LONG TERM GOAL #8   Title  Patient will reduce modified Oswestry score to <20 as to demonstrate minimal disability with ADLs including improved sleeping tolerance, walking/sitting tolerance etc for better mobility with ADLs.     Baseline  7/26: 54%; 8/22: 52% 9/26: 60% 10/25: 42%, 11/22: 40%    Time  4    Period  Weeks    Status  Partially Met    Target Date  12/08/18      PT LONG TERM GOAL  #9   TITLE  Patient will report a worst pain of 3/10 on VAS in lumbar spine to improve tolerance with ADLs and reduced symptoms with activities.     Baseline  9/26: 7/10  10/25: 6/10, 11/22: 5/10    Time  4    Period  Weeks    Status  Partially Met    Target Date  12/08/18            Plan - 11/15/18 1022    Clinical Impression Statement  Patient presents with headache due to stress of having a family member in hospice care. Patient demonstrates increased core and LE strength that is improving with functional mobility.Throughout session patient improved muscular recruitment and activation. Vacuum activation and education on proper techniques performed with patient reporting  increased pain. Patient will continue to benefit from skilled physical therapy to improve posture, strength, and reduce pain levels with ADLs.     Rehab Potential  Fair    PT Frequency  2x / week    PT Duration  4 weeks    PT Treatment/Interventions  ADLs/Self Care Home Management;Cryotherapy;Traction;Ultrasound;Therapeutic exercise;Therapeutic activities;Neuromuscular re-education;Patient/family education;Manual techniques;Passive range of motion;Dry needling;Energy conservation;Visual/perceptual remediation/compensation;Moist Heat;Iontophoresis '4mg'$ /ml Dexamethasone;Aquatic Therapy;Stair training    PT Next Visit Plan  nerve glides, muscle tissue length, posture    PT Home Exercise Plan  HEP administered    Consulted and Agree with Plan of Care  Patient       Patient will benefit from skilled therapeutic intervention in order to improve the following deficits and impairments:  Decreased activity tolerance, Decreased safety awareness, Decreased range of motion, Decreased mobility, Decreased strength, Impaired flexibility, Postural dysfunction, Improper body mechanics, Pain, Hypomobility, Increased fascial restricitons, Increased muscle spasms  Visit Diagnosis: Pain in thoracic spine  Abnormal posture  Chronic midline low back pain, unspecified whether sciatica present     Problem List Patient Active Problem List   Diagnosis Date Noted  . Cough 09/08/2017  . Type 1 dissection of ascending aorta ([place]) 04/23/2017  . HPV (human papilloma virus) infection 12/01/2016  . Screening mammogram, encounter for 11/29/2016  . Estrogen deficiency 11/29/2016  . Encounter for routine gynecological examination 11/29/2016  . Grade III hemorrhoids 10/06/2016  . Tachycardia 08/14/2016  . Hemorrhoids 08/13/2016  . Atypical migraine 08/03/2016  . CAD (coronary artery disease)   .  AAA (abdominal aortic aneurysm) ([place])   . Chronic combined systolic (congestive) and diastolic (congestive) heart failure  ([place])   . Hyperglycemia 02/11/2016  . Generalized anxiety disorder 12/08/2015  . Panic attacks 07/28/2015  . Sensorineural hearing loss 03/27/2013  . Vitamin D deficiency 07/03/2012  . H/O aortic dissection 05/12/2012  . Obesity (BMI 30.0-34.9) 11/21/2011  . Routine general medical examination at a health care facility 09/20/2011  . S/P AVR (aortic valve replacement) 01/07/2011  . CORONARY ARTERY BYPASS GRAFT, HX OF 01/07/2011  . Arthritis 12/20/2010  . H/O dissecting abdominal aortic aneurysm repair 08/24/2010  . IRRITABLE BOWEL SYNDROME 09/09/2009  . Obstructive sleep apnea 08/04/2009  . External hemorrhoid 06/26/2009  . Osteoporosis 01/09/2009  . Hyperlipidemia 07/26/2007  . Depression with anxiety 07/26/2007  . Essential hypertension 07/26/2007  . Allergic rhinitis 07/26/2007  . Asthma 07/26/2007  . GERD 07/26/2007  . Osteoarthritis 07/26/2007  . Ocular migraine 07/26/2007  . Mild intermittent asthma without complication 30/13/1438   Janna Arch, PT, DPT   11/15/2018, 10:29 AM  [place] MAIN [facility] SERVICES [address] [place], [place], [address] Phone: [phone]   Fax:  [phone]  Name: Rachel Torres MRN: [id number] Date of Birth: Jul 13, 1956

## 2018-11-20 ENCOUNTER — Ambulatory Visit: Payer: TRICARE For Life (TFL) | Admitting: Psychology

## 2018-11-21 ENCOUNTER — Ambulatory Visit: Payer: Medicare HMO | Attending: Neurosurgery

## 2018-11-21 DIAGNOSIS — M545 Low back pain: Secondary | ICD-10-CM | POA: Insufficient documentation

## 2018-11-21 DIAGNOSIS — G8929 Other chronic pain: Secondary | ICD-10-CM | POA: Insufficient documentation

## 2018-11-21 DIAGNOSIS — R293 Abnormal posture: Secondary | ICD-10-CM | POA: Insufficient documentation

## 2018-11-21 DIAGNOSIS — M546 Pain in thoracic spine: Secondary | ICD-10-CM | POA: Insufficient documentation

## 2018-11-24 ENCOUNTER — Ambulatory Visit: Payer: Medicare HMO

## 2018-11-24 ENCOUNTER — Encounter: Payer: Self-pay | Admitting: Occupational Therapy

## 2018-11-24 DIAGNOSIS — G8929 Other chronic pain: Secondary | ICD-10-CM

## 2018-11-24 DIAGNOSIS — M545 Low back pain, unspecified: Secondary | ICD-10-CM

## 2018-11-24 DIAGNOSIS — M546 Pain in thoracic spine: Secondary | ICD-10-CM

## 2018-11-24 DIAGNOSIS — R293 Abnormal posture: Secondary | ICD-10-CM | POA: Diagnosis not present

## 2018-11-24 NOTE — Therapy (Signed)
[place] MAIN [facility] SERVICES [address] [place], [place], [address] Phone: [phone]   Fax:  [phone]  Physical Therapy Treatment  Patient Details  Name: Rachel Torres MRN: [id number] Date of Birth: [birth year] Referring Provider (PT): [place] [facility]   Encounter Date: 11/24/2018  PT End of Session - 11/24/18 0952    Visit Number  56    Number of Visits  61    Date for PT Re-Evaluation  12/08/18    Authorization Type  03-28-23 PN starting 11/22    PT Start Time  0946    PT Stop Time  1030    PT Time Calculation (min)  44 min    Activity Tolerance  Patient tolerated treatment well    Behavior During Therapy  [facility] for tasks assessed/performed       Past Medical History:  Diagnosis Date  . AAA (abdominal aortic aneurysm) ([place])    a. Chronic w/o evidence of aneurysmal dil on CTA 01/2016.  Marland Kitchen Allergy   . Anemia   . Anxiety   . Asthma   . CAD (coronary artery disease)    a. 08/2010 s/p CABG x 1 (VG->RCA) @ time of Ao dissection repair; b. 01/2016 Lexiscan MV: mid antsept/apical defect w/ ? peri-infarct ischemia-->likely attenuation-->Med Rx.  . Chronic combined systolic (congestive) and diastolic (congestive) heart failure ([place])    a. 2012 EF 30-35%; b. 04/2015 EF 40-45%; c. 01/2016 Echo: Ef 55-65%, nl AoV bioprosthesis, nl RV, nl PASP.  Marland Kitchen Depression   . Gallbladder sludge   . GERD (gastroesophageal reflux disease)   . History of Bicuspid Aortic Valve    a. 08/2010 s/p AVR @ time of Ao dissection repair; b. 01/2016 Echo: Ef 55-65%, nl AoV bioprosthesis, nl RV, nl PASP.  Marland Kitchen Hx of repair of dissecting thoracic aortic aneurysm, [place] type A    a. 08/2010 s/p repair with AVR and VG->RCA; b. 01/2016 CTA: stable appearance of Asc Thoracic Aortic graft. Opacification of flase lumen of chronic abd Ao dissection w/ retrograde flow through lumbar arteries. No aneurysmal dil of flase lumen.  . Hyperlipidemia   . Hypertensive heart disease   . IBS  (irritable bowel syndrome)   . Internal hemorrhoids   . Marfan syndrome    pt denies  . Migraine   . Obstructive sleep apnea   . Osteoporosis   . Pneumonia   . RA (rheumatoid arthritis) ([place])    a. Followed by Dr. [name]    Past Surgical History:  Procedure Laterality Date  . ABDOMINAL AORTIC ANEURYSM REPAIR    . CARDIAC CATHETERIZATION  2001  . [place]  . CHOLECYSTECTOMY    . COLONOSCOPY    . FRACTURE SURGERY Right    shoulder replacement  . HEMORRHOID BANDING    . ORIF DISTAL RADIUS FRACTURE Left   . UPPER GASTROINTESTINAL ENDOSCOPY      There were no vitals filed for this visit.  Subjective Assessment - 11/24/18 0950    Subjective  Patient's nephew passed away 28-Mar-2023 which is why patient missed previous session. Has been compliant with her HEP.     Pertinent History  Patient is a pleasant 62 year old female who presents with cervical neck pain with radiating symptoms to bilateral fingers. 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  Has high stress levels due to caregiver support. Patient has gained 30 lb.  Has AAA and numbness began seven years  ago when aneurysm occured. Sleeping with a neck pillow to relieve pain. Pain has been occurring for years.     Limitations  Sitting;Lifting;Standing;Walking;Writing;House hold activities;Other (comment);Reading    Currently in Pain?  No/denies       SciFit lvl 4 2 min forward 2 minute backwards, focus on upright posture and shoulder/thoracic positioning   Manual  Prone:              CPAs and UPAs grade II-IV to thoracic and lumbar spine, 4x 10 seconds each level.Hypomobility throughout  with reduction with repetition.               Supine             Nerve glides via SLR with RLE and LLE on PT shoulder 20x      TherEx             GTB abduction 10x 3 second holds ; cues for keeping pelvis posteriorly rotated and core engaged               SLR with opp Le in  hooklying; core engaged 10x. Verbal cues for proper technique and height of LE. Verbal to slowly lower LE    Sit to stand from plinth table 10x: focus on LE activation    Seated             GTB Straight arm lat pull down 15x              Seated hamstring stretch 2x 60 seconds  GTB hamstring curl 15x each LE  GTB abductor clamshells 15x 3 second holds  GTB marches 15x each LE  GTB adduction 15x each LE against PT resistance   Figure 4 piriformis stretch 2x 60 seconds each LE                    PT Education - 11/24/18 0951    Education provided  Yes    Education Details  exercise technique, manual, body  mechanics    Person(s) Educated  Patient    Methods  Explanation;Demonstration;Verbal cues;Tactile cues    Comprehension  Verbalized understanding;Returned demonstration;Need further instruction       PT Short Term Goals - 11/10/18 1008      PT SHORT TERM GOAL #1   Title  Patient will be independent in home exercise program to improve strength/mobility for better functional independence with ADLs.    Baseline  HEP compliance    Time  2    Period  Weeks    Status  Achieved      PT SHORT TERM GOAL #2   Title  Patient will report a worst pain of 5/10 on VAS in neck to improve tolerance with ADLs and reduced symptoms with activities.     Baseline  3/4: worst pain 8/10; 4/3: 6/10 4/30: 7/10 tension headache 5/30 7/10 when lifting husband from floor; 6/27: 5/10     Time  2    Period  Weeks    Status  Achieved      PT SHORT TERM GOAL #3   Title  Patient will report a worst pain of 5/10 on VAS in thoracic spine to improve tolerance with ADLs and reduced symptoms with activities.     Baseline  7/3: 8/10 7/26: 6/10 8/22: 5/10 9/26:  6/10 10/25: 4/10    Time  2    Period  Weeks    Status  Achieved  PT Long Term Goals - 11/10/18 1009      PT LONG TERM GOAL #1   Title  Patient will report a worst pain of 3/10 on VAS in cervical spine to improve tolerance  with ADLs and reduced symptoms with activities    Baseline  worst pain 8/10; 4/3: 6/10 4/30: 7/10 tension headache from caregiver. 5/30: 7/10 from lifting husband; 6/27: 5/10; 7/26: 6/10 from stress 8/22: 1/10    Time  4    Period  Weeks    Status  Achieved      PT LONG TERM GOAL #2   Title  Patient will have no tingling in bilateral hands for two consecutive weeks to improve quality of life.     Baseline   4/3: had no tingling for about a week, came back this week 3x due to husband in the hospital. 4/30: 2x a week. 5/30: no tingling.     Time  4    Period  Weeks    Status  Achieved      PT LONG TERM GOAL #3   Title  Patient will decrease NDI score to 28% to decrease level of perceived disability to minimal.     Baseline  3/4: 46%; 4/3: 44%: 4/30: 54% 5/30: 40% 6/27: 32% 8/22: 22%    Time  4    Period  Weeks    Status  Achieved      PT LONG TERM GOAL #4   Title  Patient will improve cervical rotation ROM to 60 degrees bilaterally to improve functional mobility.     Baseline  3/4: R 39 L 41; 4/3: R 42 , L 46 4/30: supine L55 R 54 ; 5/30: seated L 55 R 56; 6/27: L 60 , R 56; 7/26: >60     Time  4    Period  Weeks    Status  Achieved      PT LONG TERM GOAL #5   Title  Patient will have reduced occurances of high levels of pain to improve quality of life    Baseline  initially was everyday now is 2x/week; 6/27: 2x/week ; 7/26: 2x/week 8/22: 2-3x/ week 9/26: 3x/week  10/25: 1x a day due to moving mom in and caregiver duties.  11/10/2018 1-2 days/wk.    Time  4    Period  Weeks    Status  Partially Met    Target Date  12/08/18      PT LONG TERM GOAL #6   Title  Patient will report a worst pain of 3/10 on VAS in thoracic spine to improve tolerance with ADLs and reduced symptoms with activities.     Baseline  7/3: 8/10 7/26: 6/10  8/22: 5/10 9/26: 6/10 10/25: 6/10, 11/22: 7-8/10 due to increased caregiver activity    Time  4    Period  Weeks    Status  Partially Met    Target Date   12/08/18      PT LONG TERM GOAL #7   Title  Patient will improve thoracic mobility to allow patient to turn in seat when driving to see behind her for increased safety with driving.     Baseline  7/3: unable to perform ; 7/26: able to perform 8/22: R limited, L functional 9/26: slightly hypmobile both directions 10/25: slightly hypomobile both directions; 11/22: Pt achieved [facility] cervical rotation.    Time  4    Period  Weeks    Status  Achieved  PT LONG TERM GOAL #8   Title  Patient will reduce modified Oswestry score to <20 as to demonstrate minimal disability with ADLs including improved sleeping tolerance, walking/sitting tolerance etc for better mobility with ADLs.     Baseline  7/26: 54%; 8/22: 52% 9/26: 60% 10/25: 42%, 11/22: 40%    Time  4    Period  Weeks    Status  Partially Met    Target Date  12/08/18      PT LONG TERM GOAL  #9   TITLE  Patient will report a worst pain of 3/10 on VAS in lumbar spine to improve tolerance with ADLs and reduced symptoms with activities.     Baseline  9/26: 7/10  10/25: 6/10, 11/22: 5/10    Time  4    Period  Weeks    Status  Partially Met    Target Date  12/08/18            Plan - 11/24/18 1021    Clinical Impression Statement  Patient presents with compliance to HEP resulting in decreased pain levels. Patient has noted weakness in LE and postural musculature requiring frequent verbal cues for body alignment due to compensatory patterning. Patient will continue to benefit from skilled physical therapy to improve posture, strength, and reduce pain levels with ADLs.     Rehab Potential  Fair    PT Frequency  2x / week    PT Duration  4 weeks    PT Treatment/Interventions  ADLs/Self Care Home Management;Cryotherapy;Traction;Ultrasound;Therapeutic exercise;Therapeutic activities;Neuromuscular re-education;Patient/family education;Manual techniques;Passive range of motion;Dry needling;Energy conservation;Visual/perceptual  remediation/compensation;Moist Heat;Iontophoresis 64m/ml Dexamethasone;Aquatic Therapy;Stair training    PT Next Visit Plan  nerve glides, muscle tissue length, posture    PT Home Exercise Plan  HEP administered    Consulted and Agree with Plan of Care  Patient       Patient will benefit from skilled therapeutic intervention in order to improve the following deficits and impairments:  Decreased activity tolerance, Decreased safety awareness, Decreased range of motion, Decreased mobility, Decreased strength, Impaired flexibility, Postural dysfunction, Improper body mechanics, Pain, Hypomobility, Increased fascial restricitons, Increased muscle spasms  Visit Diagnosis: Pain in thoracic spine  Abnormal posture  Chronic midline low back pain, unspecified whether sciatica present     Problem List Patient Active Problem List   Diagnosis Date Noted  . Cough 09/08/2017  . Type 1 dissection of ascending aorta ([place] 04/23/2017  . HPV (human papilloma virus) infection 12/01/2016  . Screening mammogram, encounter for 11/29/2016  . Estrogen deficiency 11/29/2016  . Encounter for routine gynecological examination 11/29/2016  . Grade III hemorrhoids 10/06/2016  . Tachycardia 08/14/2016  . Hemorrhoids 08/13/2016  . Atypical migraine 08/03/2016  . CAD (coronary artery disease)   . [place]   . Chronic combined systolic (congestive) and diastolic (congestive) heart failure ([place]   . Hyperglycemia 02/11/2016  . Generalized anxiety disorder 12/08/2015  . Panic attacks 07/28/2015  . Sensorineural hearing loss 03/27/2013  . Vitamin D deficiency 07/03/2012  . H/O aortic dissection 05/12/2012  . Obesity (BMI 30.0-34.9) 11/21/2011  . Routine general medical examination at a health care facility 09/20/2011  . S/P AVR (aortic valve replacement) 01/07/2011  . CORONARY ARTERY BYPASS GRAFT, HX OF 01/07/2011  . Arthritis 12/20/2010  . H/O dissecting abdominal aortic  aneurysm repair 08/24/2010  . IRRITABLE BOWEL SYNDROME 09/09/2009  . Obstructive sleep apnea 08/04/2009  . External hemorrhoid 06/26/2009  . Osteoporosis 01/09/2009  . Hyperlipidemia 07/26/2007  .  Depression with anxiety 07/26/2007  . Essential hypertension 07/26/2007  . Allergic rhinitis 07/26/2007  . Asthma 07/26/2007  . GERD 07/26/2007  . Osteoarthritis 07/26/2007  . Ocular migraine 07/26/2007  . Mild intermittent asthma without complication 03/83/3383   Janna Arch, PT, DPT   11/24/2018, 10:34 AM  [place] MAIN [facility] SERVICES [address] [place], [place], [address] Phone: [phone]   Fax:  [phone]  Name: Rachel Torres MRN: [id number] Date of Birth: [birth year]

## 2018-11-27 ENCOUNTER — Ambulatory Visit: Payer: TRICARE For Life (TFL) | Admitting: Psychology

## 2018-11-28 ENCOUNTER — Ambulatory Visit: Payer: Self-pay

## 2018-12-01 ENCOUNTER — Ambulatory Visit: Payer: Self-pay | Admitting: Family Medicine

## 2018-12-01 ENCOUNTER — Ambulatory Visit: Payer: Medicare HMO

## 2018-12-01 DIAGNOSIS — M546 Pain in thoracic spine: Secondary | ICD-10-CM | POA: Diagnosis not present

## 2018-12-01 DIAGNOSIS — R293 Abnormal posture: Secondary | ICD-10-CM | POA: Diagnosis not present

## 2018-12-01 DIAGNOSIS — M545 Low back pain, unspecified: Secondary | ICD-10-CM

## 2018-12-01 DIAGNOSIS — G8929 Other chronic pain: Secondary | ICD-10-CM | POA: Diagnosis not present

## 2018-12-01 NOTE — Therapy (Signed)
[place] MAIN [facility] SERVICES [address] [place], [place], [address] Phone: [phone]   Fax:  [phone]  Physical Therapy Treatment  Patient Details  Name: REBECKAH MASIH MRN: [id number] Date of Birth: 08-05-1956 Referring Provider (PT): [place] [facility]   Encounter Date: 12/01/2018  PT End of Session - 12/01/18 0949    Visit Number  5    Number of Visits  61    Date for PT Re-Evaluation  12/08/18    Authorization Type  4/10 PN starting 11/22    PT Start Time  0950    PT Stop Time  1045    PT Time Calculation (min)  55 min    Activity Tolerance  Patient tolerated treatment well    Behavior During Therapy  [facility] for tasks assessed/performed       Past Medical History:  Diagnosis Date  . AAA (abdominal aortic aneurysm) ([place])    a. Chronic w/o evidence of aneurysmal dil on CTA 01/2016.  Marland Kitchen Allergy   . Anemia   . Anxiety   . Asthma   . CAD (coronary artery disease)    a. 08/2010 s/p CABG x 1 (VG->RCA) @ time of Ao dissection repair; b. 01/2016 Lexiscan MV: mid antsept/apical defect w/ ? peri-infarct ischemia-->likely attenuation-->Med Rx.  . Chronic combined systolic (congestive) and diastolic (congestive) heart failure ([place])    a. 2012 EF 30-35%; b. 04/2015 EF 40-45%; c. 01/2016 Echo: Ef 55-65%, nl AoV bioprosthesis, nl RV, nl PASP.  Marland Kitchen Depression   . Gallbladder sludge   . GERD (gastroesophageal reflux disease)   . History of Bicuspid Aortic Valve    a. 08/2010 s/p AVR @ time of Ao dissection repair; b. 01/2016 Echo: Ef 55-65%, nl AoV bioprosthesis, nl RV, nl PASP.  Marland Kitchen Hx of repair of dissecting thoracic aortic aneurysm, [place] type A    a. 08/2010 s/p repair with AVR and VG->RCA; b. 01/2016 CTA: stable appearance of Asc Thoracic Aortic graft. Opacification of flase lumen of chronic abd Ao dissection w/ retrograde flow through lumbar arteries. No aneurysmal dil of flase lumen.  . Hyperlipidemia   . Hypertensive heart disease   . IBS  (irritable bowel syndrome)   . Internal hemorrhoids   . Marfan syndrome    pt denies  . Migraine   . Obstructive sleep apnea   . Osteoporosis   . Pneumonia   . RA (rheumatoid arthritis) ([place])    a. Followed by Dr. [name]    Past Surgical History:  Procedure Laterality Date  . ABDOMINAL AORTIC ANEURYSM REPAIR    . CARDIAC CATHETERIZATION  2001  . [place]  . CHOLECYSTECTOMY    . COLONOSCOPY    . FRACTURE SURGERY Right    shoulder replacement  . HEMORRHOID BANDING    . ORIF DISTAL RADIUS FRACTURE Left   . UPPER GASTROINTESTINAL ENDOSCOPY      There were no vitals filed for this visit.  Subjective Assessment - 12/01/18 0954    Subjective  Patient reported no pain at start of session. Reported she is unable to do some of the floor exercises.     Pertinent History  Patient is a pleasant 62 year old female who presents with cervical neck pain with radiating symptoms to bilateral fingers. 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  Has high stress levels due to caregiver support. Patient has gained 30 lb.  Has AAA and numbness began seven  years ago when aneurysm occured. Sleeping with a neck pillow to relieve pain. Pain has been occurring for years.     Limitations  Sitting;Lifting;Standing;Walking;Writing;House hold activities;Other (comment);Reading    How long can you sit comfortably?  an hour     How long can you stand comfortably?  n/a    How long can you walk comfortably?  n/a    Diagnostic tests  n/a    Patient Stated Goals  decrease pain.     Currently in Pain?  No/denies      TREATMENT:   SciFit lvl 4 2 min forward 2 minute backwards, focus on upright posture and shoulder/thoracic positioning    Manual  Prone:              CPAs and UPAs grade II-IV to thoracic and lumbar spine, 4x 10 seconds each level.Hypomobility throughout  with reduction with repetition.    Supine             Nerve glides via SLR with  RLE and LLE on PT shoulder 20x    TherEx Sit to stand 2x10 from standard chair, activation of core Standing hip abduction 2# AW 2x10 Standing hip extension 2# AW2x10 Hamstring curls 2#AW 2x10 LAQ 2# AW 2x10  Heel raises 2x10 2# AW Standing marching pulling hip/knee to 90/90, attention to posture, x10, x5 bilaterally    PT Education - 12/01/18 0948    Education provided  Yes    Education Details  exercise technique/form, core activation    Person(s) Educated  Patient    Methods  Explanation;Demonstration;Tactile cues;Verbal cues    Comprehension  Verbalized understanding;Need further instruction       PT Short Term Goals - 11/10/18 1008      PT SHORT TERM GOAL #1   Title  Patient will be independent in home exercise program to improve strength/mobility for better functional independence with ADLs.    Baseline  HEP compliance    Time  2    Period  Weeks    Status  Achieved      PT SHORT TERM GOAL #2   Title  Patient will report a worst pain of 5/10 on VAS in neck to improve tolerance with ADLs and reduced symptoms with activities.     Baseline  3/4: worst pain 8/10; 4/3: 6/10 4/30: 7/10 tension headache 5/30 7/10 when lifting husband from floor; 6/27: 5/10     Time  2    Period  Weeks    Status  Achieved      PT SHORT TERM GOAL #3   Title  Patient will report a worst pain of 5/10 on VAS in thoracic spine to improve tolerance with ADLs and reduced symptoms with activities.     Baseline  7/3: 8/10 7/26: 6/10 8/22: 5/10 9/26:  6/10 10/25: 4/10    Time  2    Period  Weeks    Status  Achieved        PT Long Term Goals - 11/10/18 1009      PT LONG TERM GOAL #1   Title  Patient will report a worst pain of 3/10 on VAS in cervical spine to improve tolerance with ADLs and reduced symptoms with activities    Baseline  worst pain 8/10; 4/3: 6/10 4/30: 7/10 tension headache from caregiver. 5/30: 7/10 from lifting husband; 6/27: 5/10; 7/26: 6/10 from stress 8/22: 1/10    Time  4     Period  Weeks    Status  Achieved      PT LONG TERM GOAL #2   Title  Patient will have no tingling in bilateral hands for two consecutive weeks to improve quality of life.     Baseline   4/3: had no tingling for about a week, came back this week 3x due to husband in the hospital. 4/30: 2x a week. 5/30: no tingling.     Time  4    Period  Weeks    Status  Achieved      PT LONG TERM GOAL #3   Title  Patient will decrease NDI score to 28% to decrease level of perceived disability to minimal.     Baseline  3/4: 46%; 4/3: 44%: 4/30: 54% 5/30: 40% 6/27: 32% 8/22: 22%    Time  4    Period  Weeks    Status  Achieved      PT LONG TERM GOAL #4   Title  Patient will improve cervical rotation ROM to 60 degrees bilaterally to improve functional mobility.     Baseline  3/4: R 39 L 41; 4/3: R 42 , L 46 4/30: supine L55 R 54 ; 5/30: seated L 55 R 56; 6/27: L 60 , R 56; 7/26: >60     Time  4    Period  Weeks    Status  Achieved      PT LONG TERM GOAL #5   Title  Patient will have reduced occurances of high levels of pain to improve quality of life    Baseline  initially was everyday now is 2x/week; 6/27: 2x/week ; 7/26: 2x/week 8/22: 2-3x/ week 9/26: 3x/week  10/25: 1x a day due to moving mom in and caregiver duties.  11/10/2018 1-2 days/wk.    Time  4    Period  Weeks    Status  Partially Met    Target Date  12/08/18      PT LONG TERM GOAL #6   Title  Patient will report a worst pain of 3/10 on VAS in thoracic spine to improve tolerance with ADLs and reduced symptoms with activities.     Baseline  7/3: 8/10 7/26: 6/10  8/22: 5/10 9/26: 6/10 10/25: 6/10, 11/22: 7-8/10 due to increased caregiver activity    Time  4    Period  Weeks    Status  Partially Met    Target Date  12/08/18      PT LONG TERM GOAL #7   Title  Patient will improve thoracic mobility to allow patient to turn in seat when driving to see behind her for increased safety with driving.     Baseline  7/3: unable to perform ;  7/26: able to perform 8/22: R limited, L functional 9/26: slightly hypmobile both directions 10/25: slightly hypomobile both directions; 11/22: Pt achieved [facility] cervical rotation.    Time  4    Period  Weeks    Status  Achieved      PT LONG TERM GOAL #8   Title  Patient will reduce modified Oswestry score to <20 as to demonstrate minimal disability with ADLs including improved sleeping tolerance, walking/sitting tolerance etc for better mobility with ADLs.     Baseline  7/26: 54%; 8/22: 52% 9/26: 60% 10/25: 42%, 11/22: 40%    Time  4    Period  Weeks    Status  Partially Met    Target Date  12/08/18      PT LONG TERM GOAL  #9  TITLE  Patient will report a worst pain of 3/10 on VAS in lumbar spine to improve tolerance with ADLs and reduced symptoms with activities.     Baseline  9/26: 7/10  10/25: 6/10, 11/22: 5/10    Time  4    Period  Weeks    Status  Partially Met    Target Date  12/08/18            Plan - 12/01/18 1044    Clinical Impression Statement  Patient fatigued at end of session, but able to participate and tolerate standing exercises well this session. Most challenged by standing marching with focus on balance and core strength. Intermittent verbal cues needed to ensure proper posture and core activation throughout session. The patient would benefit from further skilled PT to cotninue to progress towards goals.     Clinical Impairments Affecting Rehab Potential  + CAD testing, history of AAA, anemia, anxiety, asthma, CAD, chronic combined ystolic heart failure, GERD, Marfan syndrome, OA, RA (+) good work Psychologist, forensic, motivated, age     PT Frequency  2x / week    PT Duration  4 weeks    PT Treatment/Interventions  ADLs/Self Care Home Management;Cryotherapy;Traction;Ultrasound;Therapeutic exercise;Therapeutic activities;Neuromuscular re-education;Patient/family education;Manual techniques;Passive range of motion;Dry needling;Energy conservation;Visual/perceptual  remediation/compensation;Moist Heat;Iontophoresis 54m/ml Dexamethasone;Aquatic Therapy;Stair training    PT Next Visit Plan  nerve glides, muscle tissue length, posture    PT Home Exercise Plan  HEP administered    Consulted and Agree with Plan of Care  Patient       Patient will benefit from skilled therapeutic intervention in order to improve the following deficits and impairments:  Decreased activity tolerance, Decreased safety awareness, Decreased range of motion, Decreased mobility, Decreased strength, Impaired flexibility, Postural dysfunction, Improper body mechanics, Pain, Hypomobility, Increased fascial restricitons, Increased muscle spasms  Visit Diagnosis: Pain in thoracic spine  Abnormal posture  Chronic midline low back pain, unspecified whether sciatica present     Problem List Patient Active Problem List   Diagnosis Date Noted  . Cough 09/08/2017  . Type 1 dissection of ascending aorta ([place] 04/23/2017  . HPV (human papilloma virus) infection 12/01/2016  . Screening mammogram, encounter for 11/29/2016  . Estrogen deficiency 11/29/2016  . Encounter for routine gynecological examination 11/29/2016  . Grade III hemorrhoids 10/06/2016  . Tachycardia 08/14/2016  . Hemorrhoids 08/13/2016  . Atypical migraine 08/03/2016  . CAD (coronary artery disease)   . AAA (abdominal aortic aneurysm) ([place]   . Chronic combined systolic (congestive) and diastolic (congestive) heart failure ([place]   . Hyperglycemia 02/11/2016  . Generalized anxiety disorder 12/08/2015  . Panic attacks 07/28/2015  . Sensorineural hearing loss 03/27/2013  . Vitamin D deficiency 07/03/2012  . H/O aortic dissection 05/12/2012  . Obesity (BMI 30.0-34.9) 11/21/2011  . Routine general medical examination at a health care facility 09/20/2011  . S/P AVR (aortic valve replacement) 01/07/2011  . CORONARY ARTERY BYPASS GRAFT, HX OF 01/07/2011  . Arthritis 12/20/2010  . H/O dissecting abdominal aortic  aneurysm repair 08/24/2010  . IRRITABLE BOWEL SYNDROME 09/09/2009  . Obstructive sleep apnea 08/04/2009  . External hemorrhoid 06/26/2009  . Osteoporosis 01/09/2009  . Hyperlipidemia 07/26/2007  . Depression with anxiety 07/26/2007  . Essential hypertension 07/26/2007  . Allergic rhinitis 07/26/2007  . Asthma 07/26/2007  . GERD 07/26/2007  . Osteoarthritis 07/26/2007  . Ocular migraine 07/26/2007  . Mild intermittent asthma without complication [id number]/[id number]   DLieutenant DiegoPT, DPT 10:46 AM,12/01/18 [address]  MAIN [facility] SERVICES [place], [place], [address] Phone: [phone]   Fax:  [phone]  Name: ANNE-MARIE GENSON MRN: [id number] Date of Birth: 12-21-1955

## 2018-12-04 ENCOUNTER — Ambulatory Visit: Payer: TRICARE For Life (TFL) | Admitting: Psychology

## 2018-12-06 ENCOUNTER — Ambulatory Visit: Payer: Medicare HMO

## 2018-12-06 DIAGNOSIS — R293 Abnormal posture: Secondary | ICD-10-CM

## 2018-12-06 DIAGNOSIS — G8929 Other chronic pain: Secondary | ICD-10-CM | POA: Diagnosis not present

## 2018-12-06 DIAGNOSIS — M545 Low back pain, unspecified: Secondary | ICD-10-CM

## 2018-12-06 DIAGNOSIS — M546 Pain in thoracic spine: Secondary | ICD-10-CM | POA: Diagnosis not present

## 2018-12-06 NOTE — Therapy (Signed)
[place] MAIN [facility] SERVICES [address] [place], [place], [address] Phone: [phone]   Fax:  [phone]  Physical Therapy Treatment  Patient Details  Name: Rachel Torres MRN: [id number] Date of Birth: 05/01/1956 Referring Provider (PT): [facility]   Encounter Date: 12/06/2018  PT End of Session - 12/06/18 0928    Visit Number  46    Number of Visits  61    Date for PT Re-Evaluation  12/08/18    Authorization Type  5/10 PN starting 11/22    PT Start Time  0920    PT Stop Time  1005    PT Time Calculation (min)  45 min    Activity Tolerance  Patient tolerated treatment well    Behavior During Therapy  [facility] for tasks assessed/performed       Past Medical History:  Diagnosis Date  . AAA (abdominal aortic aneurysm) ([place])    a. Chronic w/o evidence of aneurysmal dil on CTA 01/2016.  Marland Kitchen Allergy   . Anemia   . Anxiety   . Asthma   . CAD (coronary artery disease)    a. 08/2010 s/p CABG x 1 (VG->RCA) @ time of Ao dissection repair; b. 01/2016 Lexiscan MV: mid antsept/apical defect w/ ? peri-infarct ischemia-->likely attenuation-->Med Rx.  . Chronic combined systolic (congestive) and diastolic (congestive) heart failure ([place])    a. 2012 EF 30-35%; b. 04/2015 EF 40-45%; c. 01/2016 Echo: Ef 55-65%, nl AoV bioprosthesis, nl RV, nl PASP.  Marland Kitchen Depression   . Gallbladder sludge   . GERD (gastroesophageal reflux disease)   . History of Bicuspid Aortic Valve    a. 08/2010 s/p AVR @ time of Ao dissection repair; b. 01/2016 Echo: Ef 55-65%, nl AoV bioprosthesis, nl RV, nl PASP.  Marland Kitchen Hx of repair of dissecting thoracic aortic aneurysm, [place] type A    a. 08/2010 s/p repair with AVR and VG->RCA; b. 01/2016 CTA: stable appearance of Asc Thoracic Aortic graft. Opacification of flase lumen of chronic abd Ao dissection w/ retrograde flow through lumbar arteries. No aneurysmal dil of flase lumen.  . Hyperlipidemia   . Hypertensive heart disease   . IBS  (irritable bowel syndrome)   . Internal hemorrhoids   . Marfan syndrome    pt denies  . Migraine   . Obstructive sleep apnea   . Osteoporosis   . Pneumonia   . RA (rheumatoid arthritis) ([place])    a. Followed by Dr. [name]    Past Surgical History:  Procedure Laterality Date  . ABDOMINAL AORTIC ANEURYSM REPAIR    . CARDIAC CATHETERIZATION  2001  . [place]  . CHOLECYSTECTOMY    . COLONOSCOPY    . FRACTURE SURGERY Right    shoulder replacement  . HEMORRHOID BANDING    . ORIF DISTAL RADIUS FRACTURE Left   . UPPER GASTROINTESTINAL ENDOSCOPY      There were no vitals filed for this visit.  Subjective Assessment - 12/06/18 0926    Subjective  Patient reports her knee was slightly painful after the last session but resolved quickly. Has been able to control her pain better with abdominal activation.     Pertinent History  Patient is a pleasant 62 year old female who presents with cervical neck pain with radiating symptoms to bilateral fingers. 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  Has high stress levels due to caregiver support. Patient has gained 30 lb.  Has AAA and numbness began seven years ago when aneurysm occured. Sleeping with a neck pillow to relieve pain. Pain has been occurring for years.     Limitations  Sitting;Lifting;Standing;Walking;Writing;House hold activities;Other (comment);Reading    How long can you sit comfortably?  an hour     How long can you stand comfortably?  n/a    How long can you walk comfortably?  n/a    Diagnostic tests  n/a    Patient Stated Goals  decrease pain.     Currently in Pain?  No/denies      Friday is last day of cert-see if d/c or keep coming till end of year TREATMENT:   SciFit lvl 4 2 min forward 2 minute backwards, focus on upright posture and shoulder/thoracic positioning    Manual  Prone:              CPAs and UPAs grade II-IV to thoracic and lumbar spine, 4x 10  seconds each level. Hypomobility throughout  with reduction with repetition.    Supine             Nerve glides via SLR with RLE and LLE on PT shoulder 20x    TherEx Sit to stand 2x10 from standard chair, activation of core; cues for widening BOS to decrease knee pain Standing hip abduction 2# AW 2x10 Standing hip extension 2# AW2x10 Hamstring curls 2#AW 2x10 LAQ 2# AW 2x10 ( 3 second holds, abdominal contraction)  Heel raises 2x10 2# AW Standing marching pulling hip/knee to 90/90, attention to posture, x10, 2 sets bilaterally                        PT Education - 12/06/18 0928    Education provided  Yes    Education Details  exercise technique/form, core activation     Person(s) Educated  Patient    Methods  Explanation;Demonstration;Tactile cues;Verbal cues    Comprehension  Verbalized understanding;Returned demonstration;Tactile cues required;Need further instruction;Verbal cues required       PT Short Term Goals - 11/10/18 1008      PT SHORT TERM GOAL #1   Title  Patient will be independent in home exercise program to improve strength/mobility for better functional independence with ADLs.    Baseline  HEP compliance    Time  2    Period  Weeks    Status  Achieved      PT SHORT TERM GOAL #2   Title  Patient will report a worst pain of 5/10 on VAS in neck to improve tolerance with ADLs and reduced symptoms with activities.     Baseline  3/4: worst pain 8/10; 4/3: 6/10 4/30: 7/10 tension headache 5/30 7/10 when lifting husband from floor; 6/27: 5/10     Time  2    Period  Weeks    Status  Achieved      PT SHORT TERM GOAL #3   Title  Patient will report a worst pain of 5/10 on VAS in thoracic spine to improve tolerance with ADLs and reduced symptoms with activities.     Baseline  7/3: 8/10 7/26: 6/10 8/22: 5/10 9/26:  6/10 10/25: 4/10    Time  2    Period  Weeks    Status  Achieved        PT Long Term Goals - 11/10/18 1009      PT LONG TERM  GOAL #1   Title  Patient will report a worst  pain of 3/10 on VAS in cervical spine to improve tolerance with ADLs and reduced symptoms with activities    Baseline  worst pain 8/10; 4/3: 6/10 4/30: 7/10 tension headache from caregiver. 5/30: 7/10 from lifting husband; 6/27: 5/10; 7/26: 6/10 from stress 8/22: 1/10    Time  4    Period  Weeks    Status  Achieved      PT LONG TERM GOAL #2   Title  Patient will have no tingling in bilateral hands for two consecutive weeks to improve quality of life.     Baseline   4/3: had no tingling for about a week, came back this week 3x due to husband in the hospital. 4/30: 2x a week. 5/30: no tingling.     Time  4    Period  Weeks    Status  Achieved      PT LONG TERM GOAL #3   Title  Patient will decrease NDI score to 28% to decrease level of perceived disability to minimal.     Baseline  3/4: 46%; 4/3: 44%: 4/30: 54% 5/30: 40% 6/27: 32% 8/22: 22%    Time  4    Period  Weeks    Status  Achieved      PT LONG TERM GOAL #4   Title  Patient will improve cervical rotation ROM to 60 degrees bilaterally to improve functional mobility.     Baseline  3/4: R 39 L 41; 4/3: R 42 , L 46 4/30: supine L55 R 54 ; 5/30: seated L 55 R 56; 6/27: L 60 , R 56; 7/26: >60     Time  4    Period  Weeks    Status  Achieved      PT LONG TERM GOAL #5   Title  Patient will have reduced occurances of high levels of pain to improve quality of life    Baseline  initially was everyday now is 2x/week; 6/27: 2x/week ; 7/26: 2x/week 8/22: 2-3x/ week 9/26: 3x/week  10/25: 1x a day due to moving mom in and caregiver duties.  11/10/2018 1-2 days/wk.    Time  4    Period  Weeks    Status  Partially Met    Target Date  12/08/18      PT LONG TERM GOAL #6   Title  Patient will report a worst pain of 3/10 on VAS in thoracic spine to improve tolerance with ADLs and reduced symptoms with activities.     Baseline  7/3: 8/10 7/26: 6/10  8/22: 5/10 9/26: 6/10 10/25: 6/10, 11/22: 7-8/10 due  to increased caregiver activity    Time  4    Period  Weeks    Status  Partially Met    Target Date  12/08/18      PT LONG TERM GOAL #7   Title  Patient will improve thoracic mobility to allow patient to turn in seat when driving to see behind her for increased safety with driving.     Baseline  7/3: unable to perform ; 7/26: able to perform 8/22: R limited, L functional 9/26: slightly hypmobile both directions 10/25: slightly hypomobile both directions; 11/22: Pt achieved [facility] cervical rotation.    Time  4    Period  Weeks    Status  Achieved      PT LONG TERM GOAL #8   Title  Patient will reduce modified Oswestry score to <20 as to demonstrate minimal disability with ADLs including improved  sleeping tolerance, walking/sitting tolerance etc for better mobility with ADLs.     Baseline  7/26: 54%; 8/22: 52% 9/26: 60% 10/25: 42%, 11/22: 40%    Time  4    Period  Weeks    Status  Partially Met    Target Date  12/08/18      PT LONG TERM GOAL  #9   TITLE  Patient will report a worst pain of 3/10 on VAS in lumbar spine to improve tolerance with ADLs and reduced symptoms with activities.     Baseline  9/26: 7/10  10/25: 6/10, 11/22: 5/10    Time  4    Period  Weeks    Status  Partially Met    Target Date  12/08/18            Plan - 12/06/18 3009    Clinical Impression Statement  Patient demonstrates improved core activation with interventions resulting in improved spinal positioning and protection for reduction of pain inducing movements. Patient's spine mobility improving with repetition with reduced knots in lumbar and thoracic paraspinals.  Patient nearing discharge at this time, will re-assess next session.Patient will continue to benefit from skilled physical therapy to improve posture, strength, and reduce pain levels with ADLs.      Clinical Impairments Affecting Rehab Potential  + CAD testing, history of AAA, anemia, anxiety, asthma, CAD, chronic combined ystolic heart failure,  GERD, Marfan syndrome, OA, RA (+) good work Psychologist, forensic, motivated, age     PT Frequency  2x / week    PT Duration  4 weeks    PT Treatment/Interventions  ADLs/Self Care Home Management;Cryotherapy;Traction;Ultrasound;Therapeutic exercise;Therapeutic activities;Neuromuscular re-education;Patient/family education;Manual techniques;Passive range of motion;Dry needling;Energy conservation;Visual/perceptual remediation/compensation;Moist Heat;Iontophoresis 62m/ml Dexamethasone;Aquatic Therapy;Stair training    PT Next Visit Plan  nerve glides, muscle tissue length, posture    PT Home Exercise Plan  HEP administered    Consulted and Agree with Plan of Care  Patient       Patient will benefit from skilled therapeutic intervention in order to improve the following deficits and impairments:  Decreased activity tolerance, Decreased safety awareness, Decreased range of motion, Decreased mobility, Decreased strength, Impaired flexibility, Postural dysfunction, Improper body mechanics, Pain, Hypomobility, Increased fascial restricitons, Increased muscle spasms  Visit Diagnosis: Pain in thoracic spine  Abnormal posture  Chronic midline low back pain, unspecified whether sciatica present     Problem List Patient Active Problem List   Diagnosis Date Noted  . Cough 09/08/2017  . Type 1 dissection of ascending aorta ([place] 04/23/2017  . HPV (human papilloma virus) infection 12/01/2016  . Screening mammogram, encounter for 11/29/2016  . Estrogen deficiency 11/29/2016  . Encounter for routine gynecological examination 11/29/2016  . Grade III hemorrhoids 10/06/2016  . Tachycardia 08/14/2016  . Hemorrhoids 08/13/2016  . Atypical migraine 08/03/2016  . CAD (coronary artery disease)   . AAA (abdominal aortic aneurysm) ([place]   . Chronic combined systolic (congestive) and diastolic (congestive) heart failure ([place]   . Hyperglycemia 02/11/2016  . Generalized anxiety disorder 12/08/2015  . Panic attacks  07/28/2015  . Sensorineural hearing loss 03/27/2013  . Vitamin D deficiency 07/03/2012  . H/O aortic dissection 05/12/2012  . Obesity (BMI 30.0-34.9) 11/21/2011  . Routine general medical examination at a health care facility 09/20/2011  . S/P AVR (aortic valve replacement) 01/07/2011  . CORONARY ARTERY BYPASS GRAFT, HX OF 01/07/2011  . Arthritis 12/20/2010  . H/O dissecting abdominal aortic aneurysm repair 08/24/2010  . IRRITABLE BOWEL SYNDROME 09/09/2009  . Obstructive sleep  apnea 08/04/2009  . External hemorrhoid 06/26/2009  . Osteoporosis 01/09/2009  . Hyperlipidemia 07/26/2007  . Depression with anxiety 07/26/2007  . Essential hypertension 07/26/2007  . Allergic rhinitis 07/26/2007  . Asthma 07/26/2007  . GERD 07/26/2007  . Osteoarthritis 07/26/2007  . Ocular migraine 07/26/2007  . Mild intermittent asthma without complication 64/29/0379   Janna Arch, PT, DPT   12/06/2018, 10:06 AM  [place] MAIN [facility] SERVICES [address] [place], [place], [address] Phone: [phone]   Fax:  [phone]  Name: KYNZLEE HUCKER MRN: [id number] Date of Birth: June 05, 1956

## 2018-12-08 ENCOUNTER — Ambulatory Visit: Payer: Medicare HMO

## 2018-12-15 ENCOUNTER — Ambulatory Visit: Payer: Medicare HMO

## 2018-12-15 DIAGNOSIS — M545 Low back pain, unspecified: Secondary | ICD-10-CM

## 2018-12-15 DIAGNOSIS — R293 Abnormal posture: Secondary | ICD-10-CM | POA: Diagnosis not present

## 2018-12-15 DIAGNOSIS — G8929 Other chronic pain: Secondary | ICD-10-CM | POA: Diagnosis not present

## 2018-12-15 DIAGNOSIS — M546 Pain in thoracic spine: Secondary | ICD-10-CM

## 2018-12-15 NOTE — Therapy (Signed)
[place] MAIN [facility] SERVICES [address] [place], [place], [address] Phone: [phone]   Fax:  [phone]  Physical Therapy Treatment/ RECERT/ Discharge  Patient Details  Name: Rachel Torres MRN: [id number] Date of Birth: Sep 17, 1956 Referring Provider (PT): [place] [facility]   Encounter Date: 12/15/2018  PT End of Session - 12/15/18 0829    Visit Number  59    Number of Visits  61    Date for PT Re-Evaluation  12/15/18    Authorization Type  6/10 PN starting 11/22    PT Start Time  0821    PT Stop Time  0910    PT Time Calculation (min)  49 min    Activity Tolerance  Patient tolerated treatment well    Behavior During Therapy  [facility] for tasks assessed/performed       Past Medical History:  Diagnosis Date  . AAA (abdominal aortic aneurysm) ([place])    a. Chronic w/o evidence of aneurysmal dil on CTA 01/2016.  Marland Kitchen Allergy   . Anemia   . Anxiety   . Asthma   . CAD (coronary artery disease)    a. 08/2010 s/p CABG x 1 (VG->RCA) @ time of Ao dissection repair; b. 01/2016 Lexiscan MV: mid antsept/apical defect w/ ? peri-infarct ischemia-->likely attenuation-->Med Rx.  . Chronic combined systolic (congestive) and diastolic (congestive) heart failure ([place])    a. 2012 EF 30-35%; b. 04/2015 EF 40-45%; c. 01/2016 Echo: Ef 55-65%, nl AoV bioprosthesis, nl RV, nl PASP.  Marland Kitchen Depression   . Gallbladder sludge   . GERD (gastroesophageal reflux disease)   . History of Bicuspid Aortic Valve    a. 08/2010 s/p AVR @ time of Ao dissection repair; b. 01/2016 Echo: Ef 55-65%, nl AoV bioprosthesis, nl RV, nl PASP.  Marland Kitchen Hx of repair of dissecting thoracic aortic aneurysm, Stanford type A    a. 08/2010 s/p repair with AVR and VG->RCA; b. 01/2016 CTA: stable appearance of Asc Thoracic Aortic graft. Opacification of flase lumen of chronic abd Ao dissection w/ retrograde flow through lumbar arteries. No aneurysmal dil of flase lumen.  . Hyperlipidemia   . Hypertensive heart  disease   . IBS (irritable bowel syndrome)   . Internal hemorrhoids   . Marfan syndrome    pt denies  . Migraine   . Obstructive sleep apnea   . Osteoporosis   . Pneumonia   . RA (rheumatoid arthritis) ([place])    a. Followed by Dr. [name]    Past Surgical History:  Procedure Laterality Date  . ABDOMINAL AORTIC ANEURYSM REPAIR    . CARDIAC CATHETERIZATION  2001  . [place]  . CHOLECYSTECTOMY    . COLONOSCOPY    . FRACTURE SURGERY Right    shoulder replacement  . HEMORRHOID BANDING    . ORIF DISTAL RADIUS FRACTURE Left   . UPPER GASTROINTESTINAL ENDOSCOPY      There were no vitals filed for this visit.  Subjective Assessment - 12/15/18 0828    Subjective  Patient aware today is her last session. Reports she can now tell when she is doing an activity and not having proper posture and is able to correct for it to reduce pain.     Pertinent History  Patient is a pleasant 62 year old female who presents with cervical neck pain with radiating symptoms to bilateral fingers. 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  Has high stress levels  due to caregiver support. Patient has gained 30 lb.  Has AAA and numbness began seven years ago when aneurysm occured. Sleeping with a neck pillow to relieve pain. Pain has been occurring for years.     Limitations  Sitting;Lifting;Standing;Walking;Writing;House hold activities;Other (comment);Reading    How long can you sit comfortably?  an hour     How long can you stand comfortably?  n/a    How long can you walk comfortably?  n/a    Diagnostic tests  n/a    Patient Stated Goals  decrease pain.     Currently in Pain?  No/denies       Recert/ DC (one time visit)  GOALS:  Days of high level pain: 1-2days/week when doing something Pain between shoulder blades: 5/10  VAS of thoracic: 3/10 VAS of lumbar: 3/10 MODI= 26%   Manual  Prone:              CPAs and UPAs grade II-IV to  thoracic and lumbar spine, 4x 10 seconds each level. Hypomobility throughout  with reduction with repetition.    Supine             Nerve glides via SLR with RLE and LLE on PT shoulder 20x    TherEx Hal foam roller under spine in supine: robber stretch 3x60 seconds, ER YTB 10x 3 sets  Standing hip abduction 2# AW 2x10 Standing hip extension 2# AW2x10 Hamstring curls 2#AW 2x10 LAQ 2# AW 2x10 ( 3 second holds, abdominal contraction)  Heel raises 2x10 2# AW Seated marching pulling hip/knee to 90/90 with verbal cues for position , attention to posture, x10, 2 sets bilaterally Swiss ball forward flexion rollouts 10x 10 second holds, 10x to L and 10x to R for stretch of thoracic paraspinals                          PT Education - 12/15/18 0829    Education provided  Yes    Education Details  d/c, goals, manual, exercise technique     Person(s) Educated  Patient    Methods  Explanation;Demonstration;Tactile cues;Verbal cues    Comprehension  Verbalized understanding;Returned demonstration;Verbal cues required;Tactile cues required;Need further instruction       PT Short Term Goals - 12/15/18 0906      PT SHORT TERM GOAL #1   Title  Patient will be independent in home exercise program to improve strength/mobility for better functional independence with ADLs.    Baseline  HEP compliance    Time  2    Period  Weeks    Status  Achieved      PT SHORT TERM GOAL #2   Title  Patient will report a worst pain of 5/10 on VAS in neck to improve tolerance with ADLs and reduced symptoms with activities.     Baseline  3/4: worst pain 8/10; 4/3: 6/10 4/30: 7/10 tension headache 5/30 7/10 when lifting husband from floor; 6/27: 5/10     Time  2    Period  Weeks    Status  Achieved      PT SHORT TERM GOAL #3   Title  Patient will report a worst pain of 5/10 on VAS in thoracic spine to improve tolerance with ADLs and reduced symptoms with activities.     Baseline  7/3: 8/10  7/26: 6/10 8/22: 5/10 9/26:  6/10 10/25: 4/10    Time  2    Period  [place]  Status  Achieved        PT Long Term Goals - 12/15/18 0907      PT LONG TERM GOAL #1   Title  Patient will report a worst pain of 3/10 on VAS in cervical spine to improve tolerance with ADLs and reduced symptoms with activities    Baseline  worst pain 8/10; 4/3: 6/10 4/30: 7/10 tension headache from caregiver. 5/30: 7/10 from lifting husband; 6/27: 5/10; 7/26: 6/10 from stress 8/22: 1/10    Time  4    Period  Weeks    Status  Achieved      PT LONG TERM GOAL #2   Title  Patient will have no tingling in bilateral hands for two consecutive weeks to improve quality of life.     Baseline   4/3: had no tingling for about a week, came back this week 3x due to husband in the hospital. 4/30: 2x a week. 5/30: no tingling.     Time  4    Period  Weeks    Status  Achieved      PT LONG TERM GOAL #3   Title  Patient will decrease NDI score to 28% to decrease level of perceived disability to minimal.     Baseline  3/4: 46%; 4/3: 44%: 4/30: 54% 5/30: 40% 6/27: 32% 8/22: 22%    Time  4    Period  Weeks    Status  Achieved      PT LONG TERM GOAL #4   Title  Patient will improve cervical rotation ROM to 60 degrees bilaterally to improve functional mobility.     Baseline  3/4: R 39 L 41; 4/3: R 42 , L 46 4/30: supine L55 R 54 ; 5/30: seated L 55 R 56; 6/27: L 60 , R 56; 7/26: >60     Time  4    Period  Weeks    Status  Achieved      PT LONG TERM GOAL #5   Title  Patient will have reduced occurances of high levels of pain to improve quality of life    Baseline  initially was everyday now is 2x/week; 6/27: 2x/week ; 7/26: 2x/week 8/22: 2-3x/ week 9/26: 3x/week  10/25: 1x a day due to moving mom in and caregiver duties.  11/10/2018 1-2 days/wk. 12/27: 1-2days/week due to caregiver duties    Time  4    Period  Weeks    Status  Partially Met      PT LONG TERM GOAL #6   Title  Patient will report a worst pain of 3/10 on  VAS in thoracic spine to improve tolerance with ADLs and reduced symptoms with activities.     Baseline  7/3: 8/10 7/26: 6/10  8/22: 5/10 9/26: 6/10 10/25: 6/10, 11/22: 7-8/10 due to increased caregiver activity 12/27: 3/10     Time  4    Period  Weeks    Status  Achieved      PT LONG TERM GOAL #7   Title  Patient will improve thoracic mobility to allow patient to turn in seat when driving to see behind her for increased safety with driving.     Baseline  7/3: unable to perform ; 7/26: able to perform 8/22: R limited, L functional 9/26: slightly hypmobile both directions 10/25: slightly hypomobile both directions; 11/22: Pt achieved [facility] cervical rotation.    Time  4    Period  Weeks    Status  Achieved  PT LONG TERM GOAL #8   Title  Patient will reduce modified Oswestry score to <20 as to demonstrate minimal disability with ADLs including improved sleeping tolerance, walking/sitting tolerance etc for better mobility with ADLs.     Baseline  7/26: 54%; 8/22: 52% 9/26: 60% 10/25: 42%, 11/22: 40% 12/27: 26%    Time  4    Period  Weeks    Status  Partially Met      PT LONG TERM GOAL  #9   TITLE  Patient will report a worst pain of 3/10 on VAS in lumbar spine to improve tolerance with ADLs and reduced symptoms with activities.     Baseline  9/26: 7/10  10/25: 6/10, 11/22: 5/10 12/27: 3/10     Time  4    Period  Weeks    Status  Achieved            Plan - 12/15/18 0906    Clinical Impression Statement  Patient was seen for a one time visit due to being outside of cert and requiring final visit for discharge and understanding of future home program and continued compliance with postural correction. Patient's pain has improved with decreased episodes of pain and frequency of high pain. Patient's spine mobility improving with repetition with reduced knots in lumbar and thoracic paraspinals. Patient will be discharged today but I will be happy to see patient again in the future as  needed.     Clinical Impairments Affecting Rehab Potential  + CAD testing, history of AAA, anemia, anxiety, asthma, CAD, chronic combined ystolic heart failure, GERD, Marfan syndrome, OA, RA (+) good work Psychologist, forensic, motivated, age     PT Frequency  One time visit    PT Duration  Other (comment)    PT Treatment/Interventions  ADLs/Self Care Home Management;Cryotherapy;Traction;Ultrasound;Therapeutic exercise;Therapeutic activities;Neuromuscular re-education;Patient/family education;Manual techniques;Passive range of motion;Dry needling;Energy conservation;Visual/perceptual remediation/compensation;Moist Heat;Iontophoresis 10m/ml Dexamethasone;Aquatic Therapy;Stair training    PT Home Exercise Plan  HEP administered    Consulted and Agree with Plan of Care  Patient       Patient will benefit from skilled therapeutic intervention in order to improve the following deficits and impairments:  Decreased activity tolerance, Decreased safety awareness, Decreased range of motion, Decreased mobility, Decreased strength, Impaired flexibility, Postural dysfunction, Improper body mechanics, Pain, Hypomobility, Increased fascial restricitons, Increased muscle spasms  Visit Diagnosis: Pain in thoracic spine  Abnormal posture  Chronic midline low back pain, unspecified whether sciatica present     Problem List Patient Active Problem List   Diagnosis Date Noted  . Cough 09/08/2017  . Type 1 dissection of ascending aorta ([place] 04/23/2017  . HPV (human papilloma virus) infection 12/01/2016  . Screening mammogram, encounter for 11/29/2016  . Estrogen deficiency 11/29/2016  . Encounter for routine gynecological examination 11/29/2016  . Grade III hemorrhoids 10/06/2016  . Tachycardia 08/14/2016  . Hemorrhoids 08/13/2016  . Atypical migraine 08/03/2016  . CAD (coronary artery disease)   . AAA [place]dominal aortic aneurysm) ([place]   . Chronic combined systolic (congestive) and diastolic (congestive) heart  failure ([place]   . Hyperglycemia 02/11/2016  . Generalized anxiety disorder 12/08/2015  . Panic attacks 07/28/2015  . Sensorineural hearing loss 03/27/2013  . Vitamin D deficiency 07/03/2012  . H/O aortic dissection 05/12/2012  . Obesity (BMI 30.0-34.9) 11/21/2011  . Routine general medical examination at a health care facility 09/20/2011  . S/P AVR (aortic valve replacement) 01/07/2011  . CORONARY ARTERY BYPASS GRAFT, HX OF 01/07/2011  . Arthritis 12/20/2010  .  H/O dissecting abdominal aortic aneurysm repair 08/24/2010  . IRRITABLE BOWEL SYNDROME 09/09/2009  . Obstructive sleep apnea 08/04/2009  . External hemorrhoid 06/26/2009  . Osteoporosis 01/09/2009  . Hyperlipidemia 07/26/2007  . Depression with anxiety 07/26/2007  . Essential hypertension 07/26/2007  . Allergic rhinitis 07/26/2007  . Asthma 07/26/2007  . GERD 07/26/2007  . Osteoarthritis 07/26/2007  . Ocular migraine 07/26/2007  . Mild intermittent asthma without complication 59/56/3875    Janna Arch, PT, DPT   12/15/2018, 11:40 AM  [place] MAIN [facility] SERVICES [address] [place], [place], [address] Phone: [phone]   Fax:  [phone]  Name: Rachel Torres MRN: [id number] Date of Birth: 1956-03-28

## 2018-12-18 ENCOUNTER — Ambulatory Visit: Payer: Self-pay | Admitting: Psychology

## 2018-12-25 DIAGNOSIS — H2513 Age-related nuclear cataract, bilateral: Secondary | ICD-10-CM | POA: Diagnosis not present

## 2018-12-25 DIAGNOSIS — H18899 Other specified disorders of cornea, unspecified eye: Secondary | ICD-10-CM | POA: Insufficient documentation

## 2018-12-25 DIAGNOSIS — H18893 Other specified disorders of cornea, bilateral: Secondary | ICD-10-CM | POA: Diagnosis not present

## 2018-12-25 DIAGNOSIS — H04123 Dry eye syndrome of bilateral lacrimal glands: Secondary | ICD-10-CM | POA: Diagnosis not present

## 2019-01-01 ENCOUNTER — Ambulatory Visit (INDEPENDENT_AMBULATORY_CARE_PROVIDER_SITE_OTHER): Payer: Medicare HMO | Admitting: Psychology

## 2019-01-01 DIAGNOSIS — F4323 Adjustment disorder with mixed anxiety and depressed mood: Secondary | ICD-10-CM | POA: Diagnosis not present

## 2019-01-12 ENCOUNTER — Encounter: Payer: Self-pay | Admitting: Primary Care

## 2019-01-12 ENCOUNTER — Ambulatory Visit (INDEPENDENT_AMBULATORY_CARE_PROVIDER_SITE_OTHER): Payer: Medicare HMO | Admitting: Primary Care

## 2019-01-12 VITALS — BP 126/82 | HR 76 | Temp 98.1°F | Ht 65.0 in | Wt 211.0 lb

## 2019-01-12 DIAGNOSIS — J4520 Mild intermittent asthma, uncomplicated: Secondary | ICD-10-CM

## 2019-01-12 DIAGNOSIS — J22 Unspecified acute lower respiratory infection: Secondary | ICD-10-CM

## 2019-01-12 DIAGNOSIS — J029 Acute pharyngitis, unspecified: Secondary | ICD-10-CM | POA: Diagnosis not present

## 2019-01-12 LAB — POC INFLUENZA A&B (BINAX/QUICKVUE)
Influenza A, POC: NEGATIVE
Influenza B, POC: NEGATIVE

## 2019-01-12 MED ORDER — IPRATROPIUM-ALBUTEROL 0.5-2.5 (3) MG/3ML IN SOLN
3.0000 mL | Freq: Once | RESPIRATORY_TRACT | Status: AC
  Administered 2019-01-12: 3 mL via RESPIRATORY_TRACT

## 2019-01-12 MED ORDER — DOXYCYCLINE HYCLATE 100 MG PO TABS: 100.0000 mg | ORAL_TABLET | Freq: Two times a day (BID) | ORAL | 0 refills | Status: DC

## 2019-01-12 MED ORDER — ALBUTEROL SULFATE HFA 108 (90 BASE) MCG/ACT IN AERS: 1.0000 | INHALATION_SPRAY | Freq: Four times a day (QID) | RESPIRATORY_TRACT | 0 refills | Status: DC | PRN

## 2019-01-12 MED ORDER — BENZONATATE 200 MG PO CAPS: 200.0000 mg | ORAL_CAPSULE | Freq: Three times a day (TID) | ORAL | 0 refills | Status: DC | PRN

## 2019-01-12 NOTE — Patient Instructions (Signed)
Start Doxycycline antibiotic for the infection. Take 1 tablet by mouth twice daily for 10 days.  You may take Benzonatate capsules for cough. Take 1 capsule by mouth three times daily as needed for cough.  Shortness of Breath/Wheezing/Cough: Use the albuterol inhaler. Inhale 2 puffs into the lungs every 4 to 6 hours as needed for wheezing, cough, and/or shortness of breath.   Make sure to drink plenty of water and rest.   It was a pleasure meeting you!

## 2019-01-12 NOTE — Addendum Note (Signed)
Addended by: Jacqualin Combes on: 01/12/2019 12:02 PM   Modules accepted: Orders

## 2019-01-12 NOTE — Progress Notes (Addendum)
Subjective:    Patient ID: Rachel Torres, female    DOB: 18-Nov-1956, 63 y.o.   MRN: [id number]  HPI  Rachel Torres is a 63 year old female with a history of asthma, allergic rhinitis, OSA, GERD who presents today with a chief complaint of cough.   She also reports sore throat, nasal congestion, ear pain. Her cough is productive with green sputum. Cough is deep and congested. Her symptoms began three nights ago. She denies fevers, sick contacts.   She's used her inhaler without improvement, but this is out of date. She's been taking Mucinex DM with some improvement. She also took some expired benzonatate capsules without improvement.   Review of Systems  Constitutional: Positive for fatigue. Negative for fever.  HENT: Positive for congestion and sore throat. Negative for sinus pressure.   Respiratory: Positive for cough, shortness of breath and wheezing.        Past Medical History:  Diagnosis Date  . AAA (abdominal aortic aneurysm) ([place])    a. Chronic w/o evidence of aneurysmal dil on CTA 01/2016.  Marland Kitchen Allergy   . Anemia   . Anxiety   . Asthma   . CAD (coronary artery disease)    a. 08/2010 s/p CABG x 1 (VG->RCA) @ time of Ao dissection repair; b. 01/2016 Lexiscan MV: mid antsept/apical defect w/ ? peri-infarct ischemia-->likely attenuation-->Med Rx.  . Chronic combined systolic (congestive) and diastolic (congestive) heart failure ([place])    a. 2012 EF 30-35%; b. 04/2015 EF 40-45%; c. 01/2016 Echo: Ef 55-65%, nl AoV bioprosthesis, nl RV, nl PASP.  Marland Kitchen Depression   . Gallbladder sludge   . GERD (gastroesophageal reflux disease)   . History of Bicuspid Aortic Valve    a. 08/2010 s/p AVR @ time of Ao dissection repair; b. 01/2016 Echo: Ef 55-65%, nl AoV bioprosthesis, nl RV, nl PASP.  Marland Kitchen Hx of repair of dissecting thoracic aortic aneurysm, Stanford type A    a. 08/2010 s/p repair with AVR and VG->RCA; b. 01/2016 CTA: stable appearance of Asc Thoracic Aortic graft. Opacification of flase lumen  of chronic abd Ao dissection w/ retrograde flow through lumbar arteries. No aneurysmal dil of flase lumen.  . Hyperlipidemia   . Hypertensive heart disease   . IBS (irritable bowel syndrome)   . Internal hemorrhoids   . Marfan syndrome    pt denies  . Migraine   . Obstructive sleep apnea   . Osteoporosis   . Pneumonia   . RA (rheumatoid arthritis) ([place])    a. Followed by Dr. [name]     Social History   Socioeconomic History  . Marital status: Married    Spouse name: Not on file  . Number of children: 2  . Years of education: Not on file  . Highest education level: Not on file  Occupational History  . Occupation: disabled    Employer: [employer]  Social Needs  . Financial resource strain: Not on file  . Food insecurity:    Worry: Not on file    Inability: Not on file  . Transportation needs:    Medical: Not on file    Non-medical: Not on file  Tobacco Use  . Smoking status: Former Smoker    Last attempt to quit: 12/20/1978    Years since quitting: 40.0  . Smokeless tobacco: Never Used  Substance and Sexual Activity  . Alcohol use: No    Alcohol/week: 0.0 standard drinks  . Drug use: No  . Sexual activity: Never  Lifestyle  . Physical activity:    Days per week: Not on file    Minutes per session: Not on file  . Stress: Not on file  Relationships  . Social connections:    Talks on phone: Not on file    Gets together: Not on file    Attends religious service: Not on file    Active member of club or organization: Not on file    Attends meetings of clubs or organizations: Not on file    Relationship status: Not on file  . Intimate partner violence:    Fear of current or ex partner: Not on file    Emotionally abused: Not on file    Physically abused: Not on file    Forced sexual activity: Not on file  Other Topics Concern  . Not on file  Social History Narrative    married for the second time, happily   On disability after dissection of aortic aneurysm    Husband has Parkinson's - deteriorating w/ dementia   4-6 caffeinated beverages daily   01/25/2018          Past Surgical History:  Procedure Laterality Date  . ABDOMINAL AORTIC ANEURYSM REPAIR    . CARDIAC CATHETERIZATION  2001  . [place]  . CHOLECYSTECTOMY    . COLONOSCOPY    . FRACTURE SURGERY Right    shoulder replacement  . HEMORRHOID BANDING    . ORIF DISTAL RADIUS FRACTURE Left   . UPPER GASTROINTESTINAL ENDOSCOPY      Family History  Problem Relation Age of Onset  . Hypertension Mother   . Depression Mother   . Osteoporosis Mother   . Stroke Mother   . Irritable bowel syndrome Mother   . Asthma Mother   . Arthritis Sister   . Diabetes Sister   . Heart disease Sister   . Asthma Sister   . Migraines Sister   . Nephrolithiasis Sister   . Osteoporosis Sister   . Arthritis Sister   . Asthma Sister   . Migraines Sister   . Other Father 67  . Stroke Maternal Grandmother   . Colon cancer Maternal Grandmother   . Lymphoma Maternal Grandmother   . Migraines Maternal Grandmother   . Aneurysm Paternal Grandmother        brain  . Diabetes Paternal Grandmother   . Arthritis Paternal Grandmother   . Arthritis Paternal Grandfather   . Diabetes Paternal Grandfather   . Other Brother        prediabeties  . Asthma Brother   . Migraines Brother   . Lung cancer Maternal Grandfather   . Melanoma Maternal Grandfather     Allergies  Allergen Reactions  . Fosamax [Alendronate Sodium]     GI upset   . Ginzing [Ginseng] Other (See Comments)    Hyper and jitery   . Hydrocod Polst-Cpm Polst Er Itching  . Hydrocodone-Acetaminophen Itching  . Imitrex [Sumatriptan]     Contraindicated w/ heart condition  . Oxycodone Itching  . Peanut-Containing Drug Products Other (See Comments)    Reaction:  Migraines   . Tramadol Other (See Comments)    itching  . Hydrocodone-Guaifenesin Itching  . Prednisone Rash  . Tape Itching    Current Outpatient  Medications on File Prior to Visit  Medication Sig Dispense Refill  . albuterol (PROVENTIL HFA;VENTOLIN HFA) 108 (90 Base) MCG/ACT inhaler Inhale 1-2 puffs into the lungs every 6 (six) hours as needed for wheezing or  shortness of breath. 1 Inhaler 0  . ALPRAZolam (XANAX) 0.5 MG tablet Take 1 tablet (0.5 mg total) by mouth daily as needed for anxiety. 30 tablet 0  . aspirin EC 81 MG tablet Take 81 mg by mouth at bedtime.    Marland Kitchen atorvastatin (LIPITOR) 20 MG tablet TAKE 1 TABLET (20 MG TOTAL) BY MOUTH DAILY. 90 tablet 2  . b complex vitamins tablet Take 1 tablet by mouth daily.    . benzonatate (TESSALON) 200 MG capsule Take 1 capsule (200 mg total) by mouth 3 (three) times daily as needed. 30 capsule 0  . BIOTIN PO Take 1 capsule by mouth daily.    . busPIRone (BUSPAR) 15 MG tablet Take 0.5 tablets (7.5 mg total) by mouth 2 (two) times daily. 30 tablet 11  . carvedilol (COREG) 6.25 MG tablet TAKE 1 TABLET TWICE DAILY WITH MEALS 180 tablet 2  . Cholecalciferol (VITAMIN D3) 5000 units CAPS Take 1 capsule by mouth daily.    . cyclobenzaprine (FLEXERIL) 10 MG tablet Take 0.5-1 tablets (5-10 mg total) by mouth 3 (three) times daily as needed. For headache 90 tablet 3  . furosemide (LASIX) 20 MG tablet TAKE 1 TABLET (20 MG TOTAL) BY MOUTH DAILY. 90 tablet 2  . gabapentin (NEURONTIN) 100 MG capsule Take 3 capsules (300 mg total) by mouth at bedtime. 90 capsule 5  . hydrocortisone 2.5 % cream Apply topically 2 (two) times daily. For maximum 7 days. 28 g 0  . Krill Oil (OMEGA-3) 500 MG CAPS Take 1 capsule by mouth daily.    Marland Kitchen lisinopril (PRINIVIL,ZESTRIL) 5 MG tablet TAKE 1/2 TABLET (2.5 MG TOTAL) BY MOUTH DAILY. 45 tablet 3  . magnesium gluconate (MAGONATE) 500 MG tablet Take 500 mg by mouth at bedtime.    . Misc Natural Products (TART CHERRY ADVANCED) CAPS Take 1 capsule by mouth daily.    . naproxen sodium (ANAPROX) 220 MG tablet Take 440 mg by mouth 2 (two) times daily as needed (for headaches/pain).      . nitroGLYCERIN (NITROSTAT) 0.4 MG SL tablet Place 1 tablet (0.4 mg total) under the tongue every 5 (five) minutes as needed for chest pain. 25 tablet 3  . omeprazole (PRILOSEC) 40 MG capsule TAKE 1 CAPSULE EVERY DAY 90 capsule 0  . potassium chloride (K-DUR,KLOR-CON) 10 MEQ tablet TAKE 1 TABLET TWICE DAILY AS NEEDED 180 tablet 2  . promethazine (PHENERGAN) 25 MG tablet Take 0.5 tablets (12.5 mg total) by mouth every 8 (eight) hours as needed for nausea. 90 tablet 3  . venlafaxine XR (EFFEXOR-XR) 150 MG 24 hr capsule TAKE 1 CAPSULE EVERY DAY WITH BREAKFAST 90 capsule 2   No current facility-administered medications on file prior to visit.     BP 126/82   Pulse 76   Temp 98.1 F (36.7 C) (Oral)   Ht 5\' 5"  (1.651 m)   Wt 211 lb (95.7 kg)   SpO2 96%   BMI 35.11 kg/m    Objective:   Physical Exam  Constitutional: She appears well-nourished. She appears ill.  HENT:  Right Ear: Tympanic membrane and ear canal normal.  Left Ear: Tympanic membrane and ear canal normal.  Nose: No mucosal edema. Right sinus exhibits no maxillary sinus tenderness and no frontal sinus tenderness. Left sinus exhibits no maxillary sinus tenderness and no frontal sinus tenderness.  Mouth/Throat: Oropharynx is clear and moist.  Neck: Neck supple.  Cardiovascular: Normal rate and regular rhythm.  Respiratory: Effort normal. She has wheezes in the right  upper field and the left upper field. She has rhonchi in the right upper field, the right lower field, the left upper field and the left lower field.  Skin: Skin is warm and dry.           Assessment & Plan:  Acute Lower Respiratory Tract Infection:  Cough, congestion, fatigue x 3 nights, temporary improvement with OTC treatment. Exam today with moderately congested lungs throughout, mild wheezing to upper fields. Lungs sounds are suspicious for infection. She is a non smoker. Rapid Flu: Negative Given presentation coupled with exam, will treat. Rx for  Doxycycline course, albuterol inhaler, and benzonatate capsules sent to pharmacy.  Duoneb provided in office today. Fluids, rest, follow up PRN.  Pleas Koch, NP

## 2019-01-12 NOTE — Addendum Note (Signed)
Addended by: Jacqualin Combes on: 01/12/2019 12:21 PM   Modules accepted: Orders

## 2019-01-15 ENCOUNTER — Ambulatory Visit: Payer: Medicare HMO | Admitting: Psychology

## 2019-01-15 ENCOUNTER — Telehealth: Payer: Self-pay

## 2019-01-15 DIAGNOSIS — J069 Acute upper respiratory infection, unspecified: Secondary | ICD-10-CM

## 2019-01-15 MED ORDER — HYDROCOD POLST-CPM POLST ER 10-8 MG/5ML PO SUER: 5.0000 mL | Freq: Two times a day (BID) | ORAL | 0 refills | Status: DC | PRN

## 2019-01-15 NOTE — Telephone Encounter (Signed)
Pt was seen 01/12/19; pt started taking Doxycycline on 01/12/19. Pt is not feeling any better, Benzonatate is not helping prod cough with milky phlegm. Pt states cough is terrible and having some wheezing and inhaler is not helping a lot. Pt said the neb treatment at office seemed to help.No SOB or fever.pt is drinking a lot of water, and taking mucinex DM and using flonase. Pt states she usually has to take a strong abx to get well.advised pt that it may take 2 -3 days of taking abx to get the abx in pts system. Pt voiced understanding and pt request cb after Gentry Fitz NP reviews note. [facility].

## 2019-01-15 NOTE — Telephone Encounter (Signed)
Spoken and notified patient of Rachel Torres comments. Patient verbalized understanding.  Patient stated that the only problem is that she get some itching which she would take Benadryl. Patient stated that she like Rx because her cough is really bad.

## 2019-01-15 NOTE — Telephone Encounter (Signed)
Noted, prescription sent to pharmacy. I advise she use the cough medication with caution given her prior itching. Also caution use with benadryl and the cough syrup. May want to try Claritin instead.

## 2019-01-15 NOTE — Telephone Encounter (Signed)
Please notify patient that Doxycycline antibiotic is a strong antibiotic. If her symptoms are caused by a virus then any antibiotic won't work, this may be the case. In the case of a viral illness she has to let her body work through the infection on its own.  Has she ever had cough medication with codeine? Any problems? If she can tolerate then it may be the next best step for treating her cough. Let me know.

## 2019-01-16 NOTE — Telephone Encounter (Signed)
Spoken and notified patient of Kate Clark's comments. Patient verbalized understanding.  

## 2019-01-22 ENCOUNTER — Ambulatory Visit: Payer: Self-pay | Admitting: Cardiovascular Disease

## 2019-01-23 ENCOUNTER — Telehealth: Payer: Self-pay | Admitting: *Deleted

## 2019-01-23 NOTE — Telephone Encounter (Signed)
Yes, needs re-evaluation. Please set up with PCP (Dr. Glori Bickers).

## 2019-01-23 NOTE — Telephone Encounter (Signed)
Spoke to pt who states she was recently seen and given an abx, which she completed yesterday. She states that she did have some improvement, but all of her s/s have now returned. She d/c the tessalon because she started taking the tussinex. Pt is wanting to know if she is needing to be seen again, or if something can be called in to pharmacy on file. pls advise

## 2019-01-24 DIAGNOSIS — I110 Hypertensive heart disease with heart failure: Secondary | ICD-10-CM | POA: Diagnosis not present

## 2019-01-24 DIAGNOSIS — J22 Unspecified acute lower respiratory infection: Secondary | ICD-10-CM | POA: Diagnosis not present

## 2019-01-24 DIAGNOSIS — M546 Pain in thoracic spine: Secondary | ICD-10-CM | POA: Diagnosis not present

## 2019-01-24 DIAGNOSIS — R05 Cough: Secondary | ICD-10-CM | POA: Diagnosis not present

## 2019-01-24 DIAGNOSIS — Z885 Allergy status to narcotic agent status: Secondary | ICD-10-CM | POA: Diagnosis not present

## 2019-01-24 DIAGNOSIS — R918 Other nonspecific abnormal finding of lung field: Secondary | ICD-10-CM | POA: Diagnosis not present

## 2019-01-24 DIAGNOSIS — I509 Heart failure, unspecified: Secondary | ICD-10-CM | POA: Diagnosis not present

## 2019-01-24 DIAGNOSIS — Z9049 Acquired absence of other specified parts of digestive tract: Secondary | ICD-10-CM | POA: Diagnosis not present

## 2019-01-24 DIAGNOSIS — I2510 Atherosclerotic heart disease of native coronary artery without angina pectoris: Secondary | ICD-10-CM | POA: Diagnosis not present

## 2019-01-24 DIAGNOSIS — I4949 Other premature depolarization: Secondary | ICD-10-CM | POA: Diagnosis not present

## 2019-01-24 DIAGNOSIS — J45901 Unspecified asthma with (acute) exacerbation: Secondary | ICD-10-CM | POA: Diagnosis not present

## 2019-01-24 DIAGNOSIS — E785 Hyperlipidemia, unspecified: Secondary | ICD-10-CM | POA: Diagnosis not present

## 2019-01-24 DIAGNOSIS — Z951 Presence of aortocoronary bypass graft: Secondary | ICD-10-CM | POA: Diagnosis not present

## 2019-01-24 NOTE — Telephone Encounter (Signed)
Pt states she went to ER this morning. I scheduled her an ER follow up with [facility].

## 2019-01-27 DIAGNOSIS — R0602 Shortness of breath: Secondary | ICD-10-CM | POA: Insufficient documentation

## 2019-01-27 DIAGNOSIS — J9601 Acute respiratory failure with hypoxia: Secondary | ICD-10-CM | POA: Diagnosis not present

## 2019-01-27 DIAGNOSIS — J101 Influenza due to other identified influenza virus with other respiratory manifestations: Secondary | ICD-10-CM | POA: Diagnosis not present

## 2019-01-27 DIAGNOSIS — I5042 Chronic combined systolic (congestive) and diastolic (congestive) heart failure: Secondary | ICD-10-CM | POA: Diagnosis not present

## 2019-01-27 DIAGNOSIS — R918 Other nonspecific abnormal finding of lung field: Secondary | ICD-10-CM | POA: Diagnosis not present

## 2019-01-27 DIAGNOSIS — K219 Gastro-esophageal reflux disease without esophagitis: Secondary | ICD-10-CM | POA: Diagnosis not present

## 2019-01-27 DIAGNOSIS — G4733 Obstructive sleep apnea (adult) (pediatric): Secondary | ICD-10-CM | POA: Diagnosis not present

## 2019-01-27 DIAGNOSIS — J4521 Mild intermittent asthma with (acute) exacerbation: Secondary | ICD-10-CM | POA: Diagnosis not present

## 2019-01-27 DIAGNOSIS — M069 Rheumatoid arthritis, unspecified: Secondary | ICD-10-CM | POA: Diagnosis not present

## 2019-01-27 DIAGNOSIS — H905 Unspecified sensorineural hearing loss: Secondary | ICD-10-CM | POA: Diagnosis not present

## 2019-01-27 DIAGNOSIS — K589 Irritable bowel syndrome without diarrhea: Secondary | ICD-10-CM | POA: Diagnosis not present

## 2019-01-27 DIAGNOSIS — I110 Hypertensive heart disease with heart failure: Secondary | ICD-10-CM | POA: Diagnosis not present

## 2019-01-27 DIAGNOSIS — J9811 Atelectasis: Secondary | ICD-10-CM | POA: Diagnosis not present

## 2019-01-27 DIAGNOSIS — M810 Age-related osteoporosis without current pathological fracture: Secondary | ICD-10-CM | POA: Diagnosis not present

## 2019-01-27 DIAGNOSIS — I517 Cardiomegaly: Secondary | ICD-10-CM | POA: Diagnosis not present

## 2019-01-27 DIAGNOSIS — K644 Residual hemorrhoidal skin tags: Secondary | ICD-10-CM | POA: Diagnosis not present

## 2019-01-27 DIAGNOSIS — J4541 Moderate persistent asthma with (acute) exacerbation: Secondary | ICD-10-CM | POA: Diagnosis not present

## 2019-01-27 DIAGNOSIS — I10 Essential (primary) hypertension: Secondary | ICD-10-CM | POA: Diagnosis not present

## 2019-01-27 DIAGNOSIS — R05 Cough: Secondary | ICD-10-CM | POA: Diagnosis not present

## 2019-01-28 DIAGNOSIS — I5042 Chronic combined systolic (congestive) and diastolic (congestive) heart failure: Secondary | ICD-10-CM | POA: Diagnosis not present

## 2019-01-28 DIAGNOSIS — G4733 Obstructive sleep apnea (adult) (pediatric): Secondary | ICD-10-CM | POA: Diagnosis not present

## 2019-01-28 DIAGNOSIS — M069 Rheumatoid arthritis, unspecified: Secondary | ICD-10-CM | POA: Diagnosis not present

## 2019-01-28 DIAGNOSIS — K644 Residual hemorrhoidal skin tags: Secondary | ICD-10-CM | POA: Diagnosis not present

## 2019-01-28 DIAGNOSIS — J9601 Acute respiratory failure with hypoxia: Secondary | ICD-10-CM | POA: Diagnosis not present

## 2019-01-28 DIAGNOSIS — H905 Unspecified sensorineural hearing loss: Secondary | ICD-10-CM | POA: Diagnosis not present

## 2019-01-28 DIAGNOSIS — I110 Hypertensive heart disease with heart failure: Secondary | ICD-10-CM | POA: Diagnosis not present

## 2019-01-28 DIAGNOSIS — K219 Gastro-esophageal reflux disease without esophagitis: Secondary | ICD-10-CM | POA: Diagnosis not present

## 2019-01-28 DIAGNOSIS — J4521 Mild intermittent asthma with (acute) exacerbation: Secondary | ICD-10-CM | POA: Diagnosis not present

## 2019-01-29 ENCOUNTER — Ambulatory Visit: Payer: Self-pay | Admitting: Family Medicine

## 2019-01-29 ENCOUNTER — Ambulatory Visit: Payer: Medicare HMO | Admitting: Psychology

## 2019-01-29 ENCOUNTER — Telehealth: Payer: Self-pay | Admitting: Family Medicine

## 2019-01-29 DIAGNOSIS — J4521 Mild intermittent asthma with (acute) exacerbation: Secondary | ICD-10-CM | POA: Diagnosis not present

## 2019-01-29 DIAGNOSIS — K219 Gastro-esophageal reflux disease without esophagitis: Secondary | ICD-10-CM | POA: Diagnosis not present

## 2019-01-29 DIAGNOSIS — J9601 Acute respiratory failure with hypoxia: Secondary | ICD-10-CM | POA: Diagnosis not present

## 2019-01-29 DIAGNOSIS — I5042 Chronic combined systolic (congestive) and diastolic (congestive) heart failure: Secondary | ICD-10-CM | POA: Diagnosis not present

## 2019-01-29 DIAGNOSIS — H905 Unspecified sensorineural hearing loss: Secondary | ICD-10-CM | POA: Diagnosis not present

## 2019-01-29 DIAGNOSIS — G4733 Obstructive sleep apnea (adult) (pediatric): Secondary | ICD-10-CM | POA: Diagnosis not present

## 2019-01-29 DIAGNOSIS — M069 Rheumatoid arthritis, unspecified: Secondary | ICD-10-CM | POA: Diagnosis not present

## 2019-01-29 DIAGNOSIS — I110 Hypertensive heart disease with heart failure: Secondary | ICD-10-CM | POA: Diagnosis not present

## 2019-01-29 DIAGNOSIS — K644 Residual hemorrhoidal skin tags: Secondary | ICD-10-CM | POA: Diagnosis not present

## 2019-01-29 MED ORDER — CARVEDILOL 6.25 MG PO TABS
6.25 | ORAL_TABLET | ORAL | Status: DC
Start: 2019-01-29 — End: 2019-01-29

## 2019-01-29 MED ORDER — ATORVASTATIN CALCIUM 20 MG PO TABS
20.00 | ORAL_TABLET | ORAL | Status: DC
Start: 2019-01-29 — End: 2019-01-29

## 2019-01-29 MED ORDER — OSELTAMIVIR PHOSPHATE 75 MG PO CAPS
75.00 | ORAL_CAPSULE | ORAL | Status: DC
Start: 2019-01-29 — End: 2019-01-29

## 2019-01-29 MED ORDER — ACETAMINOPHEN 325 MG PO TABS
650.00 | ORAL_TABLET | ORAL | Status: DC
Start: ? — End: 2019-01-29

## 2019-01-29 MED ORDER — FUROSEMIDE 40 MG PO TABS
40.00 | ORAL_TABLET | ORAL | Status: DC
Start: 2019-01-30 — End: 2019-01-29

## 2019-01-29 MED ORDER — PANTOPRAZOLE SODIUM 40 MG PO TBEC
40.00 | DELAYED_RELEASE_TABLET | ORAL | Status: DC
Start: 2019-01-30 — End: 2019-01-29

## 2019-01-29 MED ORDER — ENOXAPARIN SODIUM 40 MG/0.4ML ~~LOC~~ SOLN
40.00 | SUBCUTANEOUS | Status: DC
Start: 2019-01-29 — End: 2019-01-29

## 2019-01-29 MED ORDER — ONDANSETRON 4 MG PO TBDP
4.00 | ORAL_TABLET | ORAL | Status: DC
Start: ? — End: 2019-01-29

## 2019-01-29 MED ORDER — CARBOXYMETHYLCELLULOSE SOD PF 0.5 % OP SOLN
1.00 | OPHTHALMIC | Status: DC
Start: ? — End: 2019-01-29

## 2019-01-29 MED ORDER — LISINOPRIL 5 MG PO TABS
2.50 | ORAL_TABLET | ORAL | Status: DC
Start: 2019-01-30 — End: 2019-01-29

## 2019-01-29 MED ORDER — DEXAMETHASONE 4 MG PO TABS
6.00 | ORAL_TABLET | ORAL | Status: DC
Start: 2019-01-30 — End: 2019-01-29

## 2019-01-29 MED ORDER — VENLAFAXINE HCL ER 37.5 MG PO CP24
75.00 | ORAL_CAPSULE | ORAL | Status: DC
Start: 2019-01-30 — End: 2019-01-29

## 2019-01-29 MED ORDER — MENTHOL 9.1 MG MT LOZG
1.00 | LOZENGE | OROMUCOSAL | Status: DC
Start: ? — End: 2019-01-29

## 2019-01-29 MED ORDER — ALBUTEROL SULFATE (2.5 MG/3ML) 0.083% IN NEBU
2.50 | INHALATION_SOLUTION | RESPIRATORY_TRACT | Status: DC
Start: ? — End: 2019-01-29

## 2019-01-29 MED ORDER — GUAIFENESIN 100 MG/5ML PO SYRP
200.00 | ORAL_SOLUTION | ORAL | Status: DC
Start: ? — End: 2019-01-29

## 2019-01-29 MED ORDER — ASPIRIN 81 MG PO CHEW
81.00 | CHEWABLE_TABLET | ORAL | Status: DC
Start: 2019-01-30 — End: 2019-01-29

## 2019-01-29 NOTE — Telephone Encounter (Signed)
Call from her d/c physician at [facility]  Was hosp with influenza and rhinovirus with asthma  tx with tamiflu and dexamethasone Px symbicort  Also px home NMT machine   For our info when she f/u  Doing much better

## 2019-01-30 DIAGNOSIS — J4520 Mild intermittent asthma, uncomplicated: Secondary | ICD-10-CM | POA: Diagnosis not present

## 2019-01-30 DIAGNOSIS — J4541 Moderate persistent asthma with (acute) exacerbation: Secondary | ICD-10-CM | POA: Diagnosis not present

## 2019-01-31 DIAGNOSIS — J4541 Moderate persistent asthma with (acute) exacerbation: Secondary | ICD-10-CM | POA: Diagnosis not present

## 2019-01-31 DIAGNOSIS — I5042 Chronic combined systolic (congestive) and diastolic (congestive) heart failure: Secondary | ICD-10-CM | POA: Diagnosis not present

## 2019-01-31 DIAGNOSIS — J09X2 Influenza due to identified novel influenza A virus with other respiratory manifestations: Secondary | ICD-10-CM | POA: Diagnosis not present

## 2019-01-31 DIAGNOSIS — Z7982 Long term (current) use of aspirin: Secondary | ICD-10-CM | POA: Diagnosis not present

## 2019-01-31 DIAGNOSIS — I110 Hypertensive heart disease with heart failure: Secondary | ICD-10-CM | POA: Diagnosis not present

## 2019-01-31 DIAGNOSIS — Z951 Presence of aortocoronary bypass graft: Secondary | ICD-10-CM | POA: Diagnosis not present

## 2019-01-31 DIAGNOSIS — I2510 Atherosclerotic heart disease of native coronary artery without angina pectoris: Secondary | ICD-10-CM | POA: Diagnosis not present

## 2019-01-31 DIAGNOSIS — J9601 Acute respiratory failure with hypoxia: Secondary | ICD-10-CM | POA: Diagnosis not present

## 2019-01-31 DIAGNOSIS — Z87891 Personal history of nicotine dependence: Secondary | ICD-10-CM | POA: Diagnosis not present

## 2019-02-01 ENCOUNTER — Telehealth: Payer: Self-pay

## 2019-02-01 NOTE — Telephone Encounter (Signed)
Please ok those verbal orders  

## 2019-02-01 NOTE — Telephone Encounter (Signed)
Amy nurse with [facility] request verbal orders for [facility] nursing 1 x a wk for 1 wk                     2 x a wk for 2 wks                      2 x a wk for 1 wk                      1 x every other wk for 5 wks  HH aide 3 x a wk for 8 wks.

## 2019-02-02 NOTE — Telephone Encounter (Signed)
Left VM giving Amy the verbal order

## 2019-02-05 ENCOUNTER — Ambulatory Visit (INDEPENDENT_AMBULATORY_CARE_PROVIDER_SITE_OTHER): Payer: Medicare HMO | Admitting: Family Medicine

## 2019-02-05 ENCOUNTER — Encounter: Payer: Self-pay | Admitting: Family Medicine

## 2019-02-05 VITALS — BP 138/72 | HR 82 | Temp 98.5°F | Ht 65.0 in | Wt 207.2 lb

## 2019-02-05 DIAGNOSIS — E669 Obesity, unspecified: Secondary | ICD-10-CM | POA: Diagnosis not present

## 2019-02-05 DIAGNOSIS — I7101 Dissection of ascending aorta: Secondary | ICD-10-CM

## 2019-02-05 DIAGNOSIS — Z7982 Long term (current) use of aspirin: Secondary | ICD-10-CM | POA: Diagnosis not present

## 2019-02-05 DIAGNOSIS — J101 Influenza due to other identified influenza virus with other respiratory manifestations: Secondary | ICD-10-CM | POA: Diagnosis not present

## 2019-02-05 DIAGNOSIS — J4520 Mild intermittent asthma, uncomplicated: Secondary | ICD-10-CM | POA: Diagnosis not present

## 2019-02-05 DIAGNOSIS — I2510 Atherosclerotic heart disease of native coronary artery without angina pectoris: Secondary | ICD-10-CM | POA: Diagnosis not present

## 2019-02-05 DIAGNOSIS — Z951 Presence of aortocoronary bypass graft: Secondary | ICD-10-CM | POA: Diagnosis not present

## 2019-02-05 DIAGNOSIS — I5042 Chronic combined systolic (congestive) and diastolic (congestive) heart failure: Secondary | ICD-10-CM | POA: Diagnosis not present

## 2019-02-05 DIAGNOSIS — Z87891 Personal history of nicotine dependence: Secondary | ICD-10-CM | POA: Diagnosis not present

## 2019-02-05 DIAGNOSIS — J9601 Acute respiratory failure with hypoxia: Secondary | ICD-10-CM | POA: Diagnosis not present

## 2019-02-05 DIAGNOSIS — I480 Paroxysmal atrial fibrillation: Secondary | ICD-10-CM | POA: Diagnosis not present

## 2019-02-05 DIAGNOSIS — J4541 Moderate persistent asthma with (acute) exacerbation: Secondary | ICD-10-CM | POA: Diagnosis not present

## 2019-02-05 DIAGNOSIS — I110 Hypertensive heart disease with heart failure: Secondary | ICD-10-CM | POA: Diagnosis not present

## 2019-02-05 DIAGNOSIS — Z8679 Personal history of other diseases of the circulatory system: Secondary | ICD-10-CM | POA: Insufficient documentation

## 2019-02-05 DIAGNOSIS — J09X2 Influenza due to identified novel influenza A virus with other respiratory manifestations: Secondary | ICD-10-CM | POA: Diagnosis not present

## 2019-02-05 NOTE — Progress Notes (Signed)
Subjective:    Patient ID: Rachel Torres, female    DOB: [birth year], 63 y.o.   MRN: [id number]  HPI  Here for f/u of hosp from 2/8-2/10 at [place] caused by influenza in the setting of reactive airways [facility] course as follows: Hospital course by problem below:  #)Acute hypoxemic respiratory failure 2/2 Influenza, Rhinovirus, and Asthma Exacerbation: Initially presented with approximately 2 weeks of dyspnea and cough. O2 sat in the ED was found to be 88% on room air. She was started on 2 L nasal cannula which rapidly improved her O2 sat. At that time she was given 3 times duo nebs, started on IV Decadron (patient endorsed "allergy" to prednisone), and azithromycin. She had persistent symptoms and was deemed appropriate for admission. Chest x-ray showed reactive airway disease. Rapid flu was positive for influenza A. RVP was positive for rhinovirus. Patient was started on Tamiflu and continued on dexamethasone. Patient's breathing improved over the course of 2 days and she was deemed appropriate for discharge to complete course of Tamiflu and steroids. She was given a prescription of symbicort to begin at discharge as well.   Using symbicort  Also given neb machine for albuterol 2.5 mg treatments  Was d/c with 2 more days of dexamethazone   #)CHFpEF: History of HFpEF with last echo completed February 2017 showing EF greater than 55%. Originally there been some concern for HFpEF exacerbation given patient stated she was experiencing significant orthopnea. However chest x-ray was negative for pleural edema, and there were more likely etiology identified for her shortness of breath. She was continued on her home Lasix.  No swelling and no pulm edema   At d/c labs rev Glucose  was high at 179 (on steroids)   CXR report: Result Date: 01/27/2019 EXAM: XR CHEST 2 VIEWS DATE: 01/27/2019 1:06 PM ACCESSION: [id number] UN DICTATED: 01/27/2019 1:32 PM INTERPRETATION LOCATION: [place] CLINICAL INDICATION: 63 years old Female with DYSPNEA COMPARISON: Chest x-ray from 01/24/2019 TECHNIQUE: PA and Lateral Chest Radiographs. FINDINGS: Mild diffuse peribronchial wall thickening. No consolidation. Mild silhouetting of the left hemidiaphragm, likely reflects atelectasis. No pleural effusion or pneumothorax. Stable cardiomediastinal silhouette. Sternotomy wires and partially visualized right humeral head hardware, unchanged. Unchanged mild anterior wedge compression deformity of the T12 vertebral body.   Mild diffuse peribronchial thickening suggestive of viral infection versus reactive airways disease. No consolidation. Left basilar   HH was consulted at d/c   Wt Readings from Last 3 Encounters:  02/05/19 207 lb 3 oz (94 kg)  01/12/19 211 lb (95.7 kg)  06/29/18 207 lb (93.9 kg)   34.48 kg/m   Pulse ox 96% RA BP Readings from Last 3 Encounters:  02/05/19 138/72  01/12/19 126/82  06/29/18 120/70   Not eating a lot due to carbs   Cough is improved but still happening - still sore  Some wheezing (feels it in upper chest)   Not using symbicort    A little phlegm -no color to it  Sounds more loose than it is  Takes mucinex    Home health is coming this week Exhausted still - fighting to get energy back   Had cough syrup with codeine - prefers tussionex  However may now be able to get by with expectorant- DM otc   Patient Active Problem List   Diagnosis Date Noted  . Influenza A 02/05/2019  . Type 1 dissection of ascending aorta ([place]) 04/23/2017  . HPV (human papilloma virus) infection 12/01/2016  .  Screening mammogram, encounter for 11/29/2016  . Estrogen deficiency 11/29/2016  . Encounter for routine gynecological examination 11/29/2016  . Grade III hemorrhoids 10/06/2016  . Tachycardia 08/14/2016  . Hemorrhoids 08/13/2016  . Atypical migraine 08/03/2016  . CAD (coronary artery disease)   . AAA (abdominal aortic aneurysm) ([place])   . Chronic combined  systolic (congestive) and diastolic (congestive) heart failure ([place])   . Hyperglycemia 02/11/2016  . Generalized anxiety disorder 12/08/2015  . Panic attacks 07/28/2015  . Sensorineural hearing loss 03/27/2013  . Vitamin D deficiency 07/03/2012  . H/O aortic dissection 05/12/2012  . Obesity (BMI 30.0-34.9) 11/21/2011  . Routine general medical examination at a health care facility 09/20/2011  . S/P AVR (aortic valve replacement) 01/07/2011  . CORONARY ARTERY BYPASS GRAFT, HX OF 01/07/2011  . Arthritis 12/20/2010  . H/O dissecting abdominal aortic aneurysm repair 08/24/2010  . IRRITABLE BOWEL SYNDROME 09/09/2009  . Obstructive sleep apnea 08/04/2009  . External hemorrhoid 06/26/2009  . Osteoporosis 01/09/2009  . Hyperlipidemia 07/26/2007  . Depression with anxiety 07/26/2007  . Essential hypertension 07/26/2007  . Allergic rhinitis 07/26/2007  . Asthma 07/26/2007  . GERD 07/26/2007  . Osteoarthritis 07/26/2007  . Ocular migraine 07/26/2007  . Mild intermittent asthma without complication 76/19/5093   Past Medical History:  Diagnosis Date  . AAA (abdominal aortic aneurysm) ([place])    a. Chronic w/o evidence of aneurysmal dil on CTA 01/2016.  Marland Kitchen Allergy   . Anemia   . Anxiety   . Asthma   . CAD (coronary artery disease)    a. 08/2010 s/p CABG x 1 (VG->RCA) @ time of Ao dissection repair; b. 01/2016 Lexiscan MV: mid antsept/apical defect w/ ? peri-infarct ischemia-->likely attenuation-->Med Rx.  . Chronic combined systolic (congestive) and diastolic (congestive) heart failure ([place])    a. 2012 EF 30-35%; b. 04/2015 EF 40-45%; c. 01/2016 Echo: Ef 55-65%, nl AoV bioprosthesis, nl RV, nl PASP.  Marland Kitchen Depression   . Gallbladder sludge   . GERD (gastroesophageal reflux disease)   . History of Bicuspid Aortic Valve    a. 08/2010 s/p AVR @ time of Ao dissection repair; b. 01/2016 Echo: Ef 55-65%, nl AoV bioprosthesis, nl RV, nl PASP.  Marland Kitchen Hx of repair of dissecting thoracic aortic aneurysm, [place]  type A    a. 08/2010 s/p repair with AVR and VG->RCA; b. 01/2016 CTA: stable appearance of Asc Thoracic Aortic graft. Opacification of flase lumen of chronic abd Ao dissection w/ retrograde flow through lumbar arteries. No aneurysmal dil of flase lumen.  . Hyperlipidemia   . Hypertensive heart disease   . IBS (irritable bowel syndrome)   . Internal hemorrhoids   . Marfan syndrome    pt denies  . Migraine   . Obstructive sleep apnea   . Osteoporosis   . Pneumonia   . RA (rheumatoid arthritis) ([place])    a. Followed by Dr. [name]   Past Surgical History:  Procedure Laterality Date  . ABDOMINAL AORTIC ANEURYSM REPAIR    . CARDIAC CATHETERIZATION  2001  . [place]  . CHOLECYSTECTOMY    . COLONOSCOPY    . FRACTURE SURGERY Right    shoulder replacement  . HEMORRHOID BANDING    . ORIF DISTAL RADIUS FRACTURE Left   . UPPER GASTROINTESTINAL ENDOSCOPY     Social History   Tobacco Use  . Smoking status: Former Smoker    Last attempt to quit: 12/20/1978    Years since quitting: 40.1  . Smokeless tobacco: Never  Used  Substance Use Topics  . Alcohol use: No    Alcohol/week: 0.0 standard drinks  . Drug use: No   Family History  Problem Relation Age of Onset  . Hypertension Mother   . Depression Mother   . Osteoporosis Mother   . Stroke Mother   . Irritable bowel syndrome Mother   . Asthma Mother   . Arthritis Sister   . Diabetes Sister   . Heart disease Sister   . Asthma Sister   . Migraines Sister   . Nephrolithiasis Sister   . Osteoporosis Sister   . Arthritis Sister   . Asthma Sister   . Migraines Sister   . Other Father 71  . Stroke Maternal Grandmother   . Colon cancer Maternal Grandmother   . Lymphoma Maternal Grandmother   . Migraines Maternal Grandmother   . Aneurysm Paternal Grandmother        brain  . Diabetes Paternal Grandmother   . Arthritis Paternal Grandmother   . Arthritis Paternal Grandfather   . Diabetes Paternal Grandfather   .  Other Brother        prediabeties  . Asthma Brother   . Migraines Brother   . Lung cancer Maternal Grandfather   . Melanoma Maternal Grandfather    Allergies  Allergen Reactions  . Fosamax [Alendronate Sodium]     GI upset   . Ginzing [Ginseng] Other (See Comments)    Hyper and jitery   . Hydrocod Polst-Cpm Polst Er Itching  . Hydrocodone-Acetaminophen Itching  . Imitrex [Sumatriptan]     Contraindicated w/ heart condition  . Oxycodone Itching  . Peanut-Containing Drug Products Other (See Comments)    Reaction:  Migraines   . Tramadol Other (See Comments)    itching  . Hydrocodone-Guaifenesin Itching  . Prednisone Rash  . Tape Itching   Current Outpatient Medications on File Prior to Visit  Medication Sig Dispense Refill  . albuterol (PROVENTIL HFA;VENTOLIN HFA) 108 (90 Base) MCG/ACT inhaler Inhale 1-2 puffs into the lungs every 6 (six) hours as needed for wheezing or shortness of breath. 1 Inhaler 0  . ALPRAZolam (XANAX) 0.5 MG tablet Take 1 tablet (0.5 mg total) by mouth daily as needed for anxiety. 30 tablet 0  . aspirin EC 81 MG tablet Take 81 mg by mouth at bedtime.    Marland Kitchen atorvastatin (LIPITOR) 20 MG tablet TAKE 1 TABLET (20 MG TOTAL) BY MOUTH DAILY. 90 tablet 2  . b complex vitamins tablet Take 1 tablet by mouth daily.    Marland Kitchen BIOTIN PO Take 1 capsule by mouth daily.    . busPIRone (BUSPAR) 15 MG tablet Take 0.5 tablets (7.5 mg total) by mouth 2 (two) times daily. 30 tablet 11  . carvedilol (COREG) 6.25 MG tablet TAKE 1 TABLET TWICE DAILY WITH MEALS 180 tablet 2  . Cholecalciferol (VITAMIN D3) 5000 units CAPS Take 1 capsule by mouth daily.    . cyclobenzaprine (FLEXERIL) 10 MG tablet Take 0.5-1 tablets (5-10 mg total) by mouth 3 (three) times daily as needed. For headache 90 tablet 3  . furosemide (LASIX) 20 MG tablet TAKE 1 TABLET (20 MG TOTAL) BY MOUTH DAILY. 90 tablet 2  . gabapentin (NEURONTIN) 100 MG capsule Take 3 capsules (300 mg total) by mouth at bedtime. 90 capsule  5  . hydrocortisone 2.5 % cream Apply topically 2 (two) times daily. For maximum 7 days. 28 g 0  . Krill Oil (OMEGA-3) 500 MG CAPS Take 1 capsule by mouth daily.    Marland Kitchen  lisinopril (PRINIVIL,ZESTRIL) 5 MG tablet TAKE 1/2 TABLET (2.5 MG TOTAL) BY MOUTH DAILY. 45 tablet 3  . magnesium gluconate (MAGONATE) 500 MG tablet Take 500 mg by mouth at bedtime.    . Misc Natural Products (TART CHERRY ADVANCED) CAPS Take 1 capsule by mouth daily.    . naproxen sodium (ANAPROX) 220 MG tablet Take 440 mg by mouth 2 (two) times daily as needed (for headaches/pain).    . nitroGLYCERIN (NITROSTAT) 0.4 MG SL tablet Place 1 tablet (0.4 mg total) under the tongue every 5 (five) minutes as needed for chest pain. 25 tablet 3  . omeprazole (PRILOSEC) 40 MG capsule TAKE 1 CAPSULE EVERY DAY 90 capsule 0  . potassium chloride (K-DUR,KLOR-CON) 10 MEQ tablet TAKE 1 TABLET TWICE DAILY AS NEEDED 180 tablet 2  . promethazine (PHENERGAN) 25 MG tablet Take 0.5 tablets (12.5 mg total) by mouth every 8 (eight) hours as needed for nausea. 90 tablet 3  . venlafaxine XR (EFFEXOR-XR) 150 MG 24 hr capsule TAKE 1 CAPSULE EVERY DAY WITH BREAKFAST 90 capsule 2   No current facility-administered medications on file prior to visit.     Review of Systems  Constitutional: Positive for fatigue. Negative for activity change, appetite change, diaphoresis, fever and unexpected weight change.       Feels deconditioned s/p hospitalization   HENT: Negative for congestion, ear pain, rhinorrhea, sinus pressure and sore throat.   Eyes: Negative for pain, redness and visual disturbance.  Respiratory: Positive for cough and wheezing. Negative for chest tightness and shortness of breath.   Cardiovascular: Negative for chest pain and palpitations.  Gastrointestinal: Negative for abdominal pain, blood in stool, constipation and diarrhea.  Endocrine: Negative for polydipsia and polyuria.  Genitourinary: Negative for dysuria, frequency and urgency.    Musculoskeletal: Negative for arthralgias, back pain and myalgias.  Skin: Negative for pallor and rash.  Allergic/Immunologic: Negative for environmental allergies.  Neurological: Negative for dizziness, syncope and headaches.  Hematological: Negative for adenopathy. Does not bruise/bleed easily.  Psychiatric/Behavioral: Negative for decreased concentration and dysphoric mood. The patient is not nervous/anxious.        Objective:   Physical Exam Constitutional:      General: She is not in acute distress.    Appearance: Normal appearance. She is obese. She is not ill-appearing.  HENT:     Head: Normocephalic and atraumatic.     Mouth/Throat:     Mouth: Mucous membranes are moist.     Pharynx: Oropharynx is clear.  Eyes:     General: No scleral icterus.    Conjunctiva/sclera: Conjunctivae normal.     Pupils: Pupils are equal, round, and reactive to light.  Neck:     Musculoskeletal: Normal range of motion.  Cardiovascular:     Rate and Rhythm: Normal rate and regular rhythm.     Heart sounds: Murmur present.  Pulmonary:     Effort: Pulmonary effort is normal. No respiratory distress.     Breath sounds: Normal breath sounds. No stridor. No wheezing, rhonchi or rales.     Comments: Good air exch No wheezing  No rales/rhonchi Chest:     Chest wall: No tenderness.  Abdominal:     General: Abdomen is flat. Bowel sounds are normal. There is no distension.  Musculoskeletal:     Right lower leg: No edema.     Left lower leg: No edema.  Lymphadenopathy:     Cervical: No cervical adenopathy.  Skin:    Capillary Refill: Capillary refill takes less  than 2 seconds.     Coloration: Skin is not pale.     Findings: No erythema.  Neurological:     Mental Status: She is alert. Mental status is at baseline.  Psychiatric:        Mood and Affect: Mood normal.           Assessment & Plan:   Problem List Items Addressed This Visit      Cardiovascular and Mediastinum   Chronic  combined systolic (congestive) and diastolic (congestive) heart failure ([place])    Fortunately this was not a problem when she was hosp with asthma/flu  Doing well       PAF (paroxysmal atrial fibrillation) ([place])    No afib today      RESOLVED: Type 1 dissection of ascending aorta ([facility])     Respiratory   Mild intermittent asthma without complication - Primary    This flared during hosp for influenza Improved with steroids and nmts  Enc her to fill symbicort they gave her  Reviewed hospital records, lab results and studies in detail   Doubt she will need to stay on chronically  Reassuring exam today  cxr ref       Influenza A    S/p hosp for this with asthma exacerbation   Reviewed hospital records, lab results and studies in detail  Reassuring exam today  Continue expectorant DM  Rest HH-starts this week (is fatigued)  Finished tamiflu and dexamethasone Take symbicort bid for a month or until feeling better  Reassuring exam today Update if not starting to improve in a week or if worsening          Other   Obesity (BMI 30.0-34.9)    Discussed how this problem influences overall health and the risks it imposes  Reviewed plan for weight loss with lower calorie diet (via better food choices and also portion control or program like weight watchers) and exercise building up to or more than 30 minutes 5 days per week including some aerobic activity   Not ready to exercise but enc good diet

## 2019-02-05 NOTE — Assessment & Plan Note (Signed)
This flared during hosp for influenza Improved with steroids and nmts  Enc her to fill symbicort they gave her  Reviewed hospital records, lab results and studies in detail   Doubt she will need to stay on chronically  Reassuring exam today  cxr ref

## 2019-02-05 NOTE — Assessment & Plan Note (Signed)
No afib today

## 2019-02-05 NOTE — Assessment & Plan Note (Signed)
Fortunately this was not a problem when she was hosp with asthma/flu  Doing well

## 2019-02-05 NOTE — Patient Instructions (Addendum)
Do use the symbicort twice daily for a month or until feeling  It helps tightness and inflammation in lungs Get rest when you can  Drink fluids   It will take a while to get your energy and strength back   Update if not starting to improve more in a week or if worsening    Breathing sounds good today

## 2019-02-05 NOTE — Assessment & Plan Note (Signed)
S/p hosp for this with asthma exacerbation   Reviewed hospital records, lab results and studies in detail  Reassuring exam today  Continue expectorant DM  Rest HH-starts this week (is fatigued)  Finished tamiflu and dexamethasone Take symbicort bid for a month or until feeling better  Reassuring exam today Update if not starting to improve in a week or if worsening

## 2019-02-05 NOTE — Assessment & Plan Note (Signed)
Discussed how this problem influences overall health and the risks it imposes  Reviewed plan for weight loss with lower calorie diet (via better food choices and also portion control or program like weight watchers) and exercise building up to or more than 30 minutes 5 days per week including some aerobic activity   Not ready to exercise but enc good diet

## 2019-02-12 ENCOUNTER — Ambulatory Visit (INDEPENDENT_AMBULATORY_CARE_PROVIDER_SITE_OTHER): Payer: Medicare HMO | Admitting: Psychology

## 2019-02-12 DIAGNOSIS — F4323 Adjustment disorder with mixed anxiety and depressed mood: Secondary | ICD-10-CM

## 2019-02-13 ENCOUNTER — Encounter: Payer: Self-pay | Admitting: Family Medicine

## 2019-02-13 DIAGNOSIS — I110 Hypertensive heart disease with heart failure: Secondary | ICD-10-CM | POA: Diagnosis not present

## 2019-02-13 DIAGNOSIS — Z7982 Long term (current) use of aspirin: Secondary | ICD-10-CM | POA: Diagnosis not present

## 2019-02-13 DIAGNOSIS — J9601 Acute respiratory failure with hypoxia: Secondary | ICD-10-CM | POA: Diagnosis not present

## 2019-02-13 DIAGNOSIS — Z951 Presence of aortocoronary bypass graft: Secondary | ICD-10-CM | POA: Diagnosis not present

## 2019-02-13 DIAGNOSIS — I2510 Atherosclerotic heart disease of native coronary artery without angina pectoris: Secondary | ICD-10-CM | POA: Diagnosis not present

## 2019-02-13 DIAGNOSIS — I5042 Chronic combined systolic (congestive) and diastolic (congestive) heart failure: Secondary | ICD-10-CM | POA: Diagnosis not present

## 2019-02-13 DIAGNOSIS — Z87891 Personal history of nicotine dependence: Secondary | ICD-10-CM | POA: Diagnosis not present

## 2019-02-13 DIAGNOSIS — J09X2 Influenza due to identified novel influenza A virus with other respiratory manifestations: Secondary | ICD-10-CM | POA: Diagnosis not present

## 2019-02-13 DIAGNOSIS — J4541 Moderate persistent asthma with (acute) exacerbation: Secondary | ICD-10-CM | POA: Diagnosis not present

## 2019-02-15 DIAGNOSIS — J9601 Acute respiratory failure with hypoxia: Secondary | ICD-10-CM | POA: Diagnosis not present

## 2019-02-15 DIAGNOSIS — I5042 Chronic combined systolic (congestive) and diastolic (congestive) heart failure: Secondary | ICD-10-CM

## 2019-02-15 DIAGNOSIS — Z7982 Long term (current) use of aspirin: Secondary | ICD-10-CM

## 2019-02-15 DIAGNOSIS — Z7951 Long term (current) use of inhaled steroids: Secondary | ICD-10-CM

## 2019-02-15 DIAGNOSIS — I2510 Atherosclerotic heart disease of native coronary artery without angina pectoris: Secondary | ICD-10-CM | POA: Diagnosis not present

## 2019-02-15 DIAGNOSIS — Z951 Presence of aortocoronary bypass graft: Secondary | ICD-10-CM

## 2019-02-15 DIAGNOSIS — Z79899 Other long term (current) drug therapy: Secondary | ICD-10-CM

## 2019-02-15 DIAGNOSIS — I110 Hypertensive heart disease with heart failure: Secondary | ICD-10-CM

## 2019-02-15 DIAGNOSIS — Z87891 Personal history of nicotine dependence: Secondary | ICD-10-CM

## 2019-02-15 DIAGNOSIS — J4541 Moderate persistent asthma with (acute) exacerbation: Secondary | ICD-10-CM | POA: Diagnosis not present

## 2019-02-15 DIAGNOSIS — J09X2 Influenza due to identified novel influenza A virus with other respiratory manifestations: Secondary | ICD-10-CM | POA: Diagnosis not present

## 2019-02-15 NOTE — Telephone Encounter (Signed)
Letter in IN box 

## 2019-02-16 DIAGNOSIS — Z7982 Long term (current) use of aspirin: Secondary | ICD-10-CM | POA: Diagnosis not present

## 2019-02-16 DIAGNOSIS — I5042 Chronic combined systolic (congestive) and diastolic (congestive) heart failure: Secondary | ICD-10-CM | POA: Diagnosis not present

## 2019-02-16 DIAGNOSIS — I110 Hypertensive heart disease with heart failure: Secondary | ICD-10-CM | POA: Diagnosis not present

## 2019-02-16 DIAGNOSIS — Z87891 Personal history of nicotine dependence: Secondary | ICD-10-CM | POA: Diagnosis not present

## 2019-02-16 DIAGNOSIS — Z951 Presence of aortocoronary bypass graft: Secondary | ICD-10-CM | POA: Diagnosis not present

## 2019-02-16 DIAGNOSIS — J9601 Acute respiratory failure with hypoxia: Secondary | ICD-10-CM | POA: Diagnosis not present

## 2019-02-16 DIAGNOSIS — I2510 Atherosclerotic heart disease of native coronary artery without angina pectoris: Secondary | ICD-10-CM | POA: Diagnosis not present

## 2019-02-16 DIAGNOSIS — J4541 Moderate persistent asthma with (acute) exacerbation: Secondary | ICD-10-CM | POA: Diagnosis not present

## 2019-02-16 DIAGNOSIS — J09X2 Influenza due to identified novel influenza A virus with other respiratory manifestations: Secondary | ICD-10-CM | POA: Diagnosis not present

## 2019-02-20 ENCOUNTER — Other Ambulatory Visit: Payer: Self-pay | Admitting: Family Medicine

## 2019-02-20 ENCOUNTER — Other Ambulatory Visit: Payer: Self-pay | Admitting: Cardiovascular Disease

## 2019-02-20 DIAGNOSIS — I5042 Chronic combined systolic (congestive) and diastolic (congestive) heart failure: Secondary | ICD-10-CM | POA: Diagnosis not present

## 2019-02-20 DIAGNOSIS — I110 Hypertensive heart disease with heart failure: Secondary | ICD-10-CM | POA: Diagnosis not present

## 2019-02-20 DIAGNOSIS — Z87891 Personal history of nicotine dependence: Secondary | ICD-10-CM | POA: Diagnosis not present

## 2019-02-20 DIAGNOSIS — Z7982 Long term (current) use of aspirin: Secondary | ICD-10-CM | POA: Diagnosis not present

## 2019-02-20 DIAGNOSIS — J9601 Acute respiratory failure with hypoxia: Secondary | ICD-10-CM | POA: Diagnosis not present

## 2019-02-20 DIAGNOSIS — Z951 Presence of aortocoronary bypass graft: Secondary | ICD-10-CM | POA: Diagnosis not present

## 2019-02-20 DIAGNOSIS — I2510 Atherosclerotic heart disease of native coronary artery without angina pectoris: Secondary | ICD-10-CM | POA: Diagnosis not present

## 2019-02-20 DIAGNOSIS — J4541 Moderate persistent asthma with (acute) exacerbation: Secondary | ICD-10-CM | POA: Diagnosis not present

## 2019-02-20 DIAGNOSIS — J09X2 Influenza due to identified novel influenza A virus with other respiratory manifestations: Secondary | ICD-10-CM | POA: Diagnosis not present

## 2019-02-23 DIAGNOSIS — I5042 Chronic combined systolic (congestive) and diastolic (congestive) heart failure: Secondary | ICD-10-CM | POA: Diagnosis not present

## 2019-02-23 DIAGNOSIS — J09X2 Influenza due to identified novel influenza A virus with other respiratory manifestations: Secondary | ICD-10-CM | POA: Diagnosis not present

## 2019-02-23 DIAGNOSIS — Z951 Presence of aortocoronary bypass graft: Secondary | ICD-10-CM | POA: Diagnosis not present

## 2019-02-23 DIAGNOSIS — I2510 Atherosclerotic heart disease of native coronary artery without angina pectoris: Secondary | ICD-10-CM | POA: Diagnosis not present

## 2019-02-23 DIAGNOSIS — J9601 Acute respiratory failure with hypoxia: Secondary | ICD-10-CM | POA: Diagnosis not present

## 2019-02-23 DIAGNOSIS — J4541 Moderate persistent asthma with (acute) exacerbation: Secondary | ICD-10-CM | POA: Diagnosis not present

## 2019-02-23 DIAGNOSIS — Z7982 Long term (current) use of aspirin: Secondary | ICD-10-CM | POA: Diagnosis not present

## 2019-02-23 DIAGNOSIS — Z87891 Personal history of nicotine dependence: Secondary | ICD-10-CM | POA: Diagnosis not present

## 2019-02-23 DIAGNOSIS — I110 Hypertensive heart disease with heart failure: Secondary | ICD-10-CM | POA: Diagnosis not present

## 2019-02-28 DIAGNOSIS — J4520 Mild intermittent asthma, uncomplicated: Secondary | ICD-10-CM | POA: Diagnosis not present

## 2019-02-28 DIAGNOSIS — J09X2 Influenza due to identified novel influenza A virus with other respiratory manifestations: Secondary | ICD-10-CM | POA: Diagnosis not present

## 2019-02-28 DIAGNOSIS — I5042 Chronic combined systolic (congestive) and diastolic (congestive) heart failure: Secondary | ICD-10-CM | POA: Diagnosis not present

## 2019-02-28 DIAGNOSIS — Z951 Presence of aortocoronary bypass graft: Secondary | ICD-10-CM | POA: Diagnosis not present

## 2019-02-28 DIAGNOSIS — J4541 Moderate persistent asthma with (acute) exacerbation: Secondary | ICD-10-CM | POA: Diagnosis not present

## 2019-02-28 DIAGNOSIS — Z87891 Personal history of nicotine dependence: Secondary | ICD-10-CM | POA: Diagnosis not present

## 2019-02-28 DIAGNOSIS — Z7982 Long term (current) use of aspirin: Secondary | ICD-10-CM | POA: Diagnosis not present

## 2019-02-28 DIAGNOSIS — I110 Hypertensive heart disease with heart failure: Secondary | ICD-10-CM | POA: Diagnosis not present

## 2019-02-28 DIAGNOSIS — J9601 Acute respiratory failure with hypoxia: Secondary | ICD-10-CM | POA: Diagnosis not present

## 2019-02-28 DIAGNOSIS — I2510 Atherosclerotic heart disease of native coronary artery without angina pectoris: Secondary | ICD-10-CM | POA: Diagnosis not present

## 2019-03-01 ENCOUNTER — Ambulatory Visit: Payer: Medicare HMO | Admitting: Psychology

## 2019-03-13 ENCOUNTER — Ambulatory Visit: Payer: Self-pay

## 2019-03-13 ENCOUNTER — Ambulatory Visit (INDEPENDENT_AMBULATORY_CARE_PROVIDER_SITE_OTHER): Payer: Medicare HMO | Admitting: Psychology

## 2019-03-13 DIAGNOSIS — F4323 Adjustment disorder with mixed anxiety and depressed mood: Secondary | ICD-10-CM | POA: Diagnosis not present

## 2019-03-20 ENCOUNTER — Encounter: Payer: Self-pay | Admitting: Family Medicine

## 2019-03-28 ENCOUNTER — Ambulatory Visit (INDEPENDENT_AMBULATORY_CARE_PROVIDER_SITE_OTHER): Payer: Medicare HMO | Admitting: Psychology

## 2019-03-28 DIAGNOSIS — F4323 Adjustment disorder with mixed anxiety and depressed mood: Secondary | ICD-10-CM

## 2019-03-31 DIAGNOSIS — J4541 Moderate persistent asthma with (acute) exacerbation: Secondary | ICD-10-CM | POA: Diagnosis not present

## 2019-03-31 DIAGNOSIS — J4520 Mild intermittent asthma, uncomplicated: Secondary | ICD-10-CM | POA: Diagnosis not present

## 2019-04-02 ENCOUNTER — Telehealth: Payer: Self-pay | Admitting: *Deleted

## 2019-04-02 ENCOUNTER — Encounter: Payer: Self-pay | Admitting: Family Medicine

## 2019-04-02 ENCOUNTER — Ambulatory Visit (INDEPENDENT_AMBULATORY_CARE_PROVIDER_SITE_OTHER): Payer: Medicare HMO | Admitting: Family Medicine

## 2019-04-02 DIAGNOSIS — S91134A Puncture wound without foreign body of right lesser toe(s) without damage to nail, initial encounter: Secondary | ICD-10-CM | POA: Diagnosis not present

## 2019-04-02 MED ORDER — AMOXICILLIN-POT CLAVULANATE 875-125 MG PO TABS: 1.0000 | ORAL_TABLET | Freq: Two times a day (BID) | ORAL | 0 refills | Status: DC

## 2019-04-02 NOTE — Progress Notes (Signed)
Virtual Visit via Video Note  I connected with Rachel Torres on 04/02/19 at  4:00 PM EDT by a video enabled telemedicine application and verified that I am speaking with the correct person using two identifiers. The patient is at home today  I am in my office  I discussed the limitations of evaluation and management by telemedicine and the availability of in person appointments. The patient expressed understanding and agreed to proceed.  History of Present Illness: Here for foot injury Last Td was 1/10  She stepped on a big staple and it punctured her foot (heel) - right foot Right near cat box and it was not clean 1/4 to 1/2 inch Someone had to pull it out  Used rubbing alcohol and bandage No abx oint   2 cm of redness around it  Is able to walk on it  No drainage today  Sore to walk on   Review of Systems  Constitutional: Negative for chills, fever and malaise/fatigue.  Respiratory: Negative for shortness of breath.   Cardiovascular: Negative for chest pain and palpitations.  Skin: Negative for itching and rash.  Neurological: Negative for tingling and focal weakness.  Endo/Heme/Allergies: Does not bruise/bleed easily.      Patient Active Problem List   Diagnosis Date Noted  . Puncture wound of fifth toe of right foot 04/02/2019  . Influenza A 02/05/2019  . PAF (paroxysmal atrial fibrillation) ([place]) 02/05/2019  . HPV (human papilloma virus) infection 12/01/2016  . Screening mammogram, encounter for 11/29/2016  . Estrogen deficiency 11/29/2016  . Encounter for routine gynecological examination 11/29/2016  . Grade III hemorrhoids 10/06/2016  . Tachycardia 08/14/2016  . Hemorrhoids 08/13/2016  . Atypical migraine 08/03/2016  . CAD (coronary artery disease)   . AAA (abdominal aortic aneurysm) ([place])   . Chronic combined systolic (congestive) and diastolic (congestive) heart failure ([place])   . Hyperglycemia 02/11/2016  . Generalized anxiety disorder 12/08/2015  .  Panic attacks 07/28/2015  . Sensorineural hearing loss 03/27/2013  . Vitamin D deficiency 07/03/2012  . H/O aortic dissection 05/12/2012  . Obesity (BMI 30.0-34.9) 11/21/2011  . Routine general medical examination at a health care facility 09/20/2011  . S/P AVR (aortic valve replacement) 01/07/2011  . CORONARY ARTERY BYPASS GRAFT, HX OF 01/07/2011  . Arthritis 12/20/2010  . H/O dissecting abdominal aortic aneurysm repair 08/24/2010  . IRRITABLE BOWEL SYNDROME 09/09/2009  . Obstructive sleep apnea 08/04/2009  . External hemorrhoid 06/26/2009  . Osteoporosis 01/09/2009  . Hyperlipidemia 07/26/2007  . Depression with anxiety 07/26/2007  . Essential hypertension 07/26/2007  . Allergic rhinitis 07/26/2007  . Asthma 07/26/2007  . GERD 07/26/2007  . Osteoarthritis 07/26/2007  . Ocular migraine 07/26/2007  . Mild intermittent asthma without complication 20/94/7096   Past Medical History:  Diagnosis Date  . AAA (abdominal aortic aneurysm) ([place])    a. Chronic w/o evidence of aneurysmal dil on CTA 01/2016.  Marland Kitchen Allergy   . Anemia   . Anxiety   . Asthma   . CAD (coronary artery disease)    a. 08/2010 s/p CABG x 1 (VG->RCA) @ time of Ao dissection repair; b. 01/2016 Lexiscan MV: mid antsept/apical defect w/ ? peri-infarct ischemia-->likely attenuation-->Med Rx.  . Chronic combined systolic (congestive) and diastolic (congestive) heart failure ([place])    a. 2012 EF 30-35%; b. 04/2015 EF 40-45%; c. 01/2016 Echo: Ef 55-65%, nl AoV bioprosthesis, nl RV, nl PASP.  Marland Kitchen Depression   . Gallbladder sludge   . GERD (gastroesophageal reflux disease)   .  History of Bicuspid Aortic Valve    a. 08/2010 s/p AVR @ time of Ao dissection repair; b. 01/2016 Echo: Ef 55-65%, nl AoV bioprosthesis, nl RV, nl PASP.  Marland Kitchen Hx of repair of dissecting thoracic aortic aneurysm, [place] type A    a. 08/2010 s/p repair with AVR and VG->RCA; b. 01/2016 CTA: stable appearance of Asc Thoracic Aortic graft. Opacification of flase lumen  of chronic abd Ao dissection w/ retrograde flow through lumbar arteries. No aneurysmal dil of flase lumen.  . Hyperlipidemia   . Hypertensive heart disease   . IBS (irritable bowel syndrome)   . Internal hemorrhoids   . Marfan syndrome    pt denies  . Migraine   . Obstructive sleep apnea   . Osteoporosis   . Pneumonia   . RA (rheumatoid arthritis) ([place])    a. Followed by Dr. [name]   Past Surgical History:  Procedure Laterality Date  . ABDOMINAL AORTIC ANEURYSM REPAIR    . CARDIAC CATHETERIZATION  2001  . [place]  . CHOLECYSTECTOMY    . COLONOSCOPY    . FRACTURE SURGERY Right    shoulder replacement  . HEMORRHOID BANDING    . ORIF DISTAL RADIUS FRACTURE Left   . UPPER GASTROINTESTINAL ENDOSCOPY     Social History   Tobacco Use  . Smoking status: Former Smoker    Last attempt to quit: 12/20/1978    Years since quitting: 40.3  . Smokeless tobacco: Never Used  Substance Use Topics  . Alcohol use: No    Alcohol/week: 0.0 standard drinks  . Drug use: No   Family History  Problem Relation Age of Onset  . Hypertension Mother   . Depression Mother   . Osteoporosis Mother   . Stroke Mother   . Irritable bowel syndrome Mother   . Asthma Mother   . Arthritis Sister   . Diabetes Sister   . Heart disease Sister   . Asthma Sister   . Migraines Sister   . Nephrolithiasis Sister   . Osteoporosis Sister   . Arthritis Sister   . Asthma Sister   . Migraines Sister   . Other Father 85  . Stroke Maternal Grandmother   . Colon cancer Maternal Grandmother   . Lymphoma Maternal Grandmother   . Migraines Maternal Grandmother   . Aneurysm Paternal Grandmother        brain  . Diabetes Paternal Grandmother   . Arthritis Paternal Grandmother   . Arthritis Paternal Grandfather   . Diabetes Paternal Grandfather   . Other Brother        prediabeties  . Asthma Brother   . Migraines Brother   . Lung cancer Maternal Grandfather   . Melanoma Maternal  Grandfather    Allergies  Allergen Reactions  . Fosamax [Alendronate Sodium]     GI upset   . Ginzing [Ginseng] Other (See Comments)    Hyper and jitery   . Hydrocod Polst-Cpm Polst Er Itching  . Hydrocodone-Acetaminophen Itching  . Imitrex [Sumatriptan]     Contraindicated w/ heart condition  . Oxycodone Itching  . Peanut-Containing Drug Products Other (See Comments)    Reaction:  Migraines   . Tramadol Other (See Comments)    itching  . Hydrocodone-Guaifenesin Itching  . Prednisone Rash  . Tape Itching   Current Outpatient Medications on File Prior to Visit  Medication Sig Dispense Refill  . albuterol (PROVENTIL HFA;VENTOLIN HFA) 108 (90 Base) MCG/ACT inhaler Inhale 1-2 puffs into the  lungs every 6 (six) hours as needed for wheezing or shortness of breath. 1 Inhaler 0  . ALPRAZolam (XANAX) 0.5 MG tablet Take 1 tablet (0.5 mg total) by mouth daily as needed for anxiety. 30 tablet 0  . aspirin EC 81 MG tablet Take 81 mg by mouth at bedtime.    Marland Kitchen atorvastatin (LIPITOR) 20 MG tablet TAKE 1 TABLET (20 MG TOTAL) BY MOUTH DAILY. 90 tablet 2  . b complex vitamins tablet Take 1 tablet by mouth daily.    . busPIRone (BUSPAR) 15 MG tablet Take 0.5 tablets (7.5 mg total) by mouth 2 (two) times daily. 30 tablet 11  . carvedilol (COREG) 6.25 MG tablet TAKE 1 TABLET TWICE DAILY WITH MEALS 180 tablet 2  . Cholecalciferol (VITAMIN D3) 5000 units CAPS Take 1 capsule by mouth daily.    . cyclobenzaprine (FLEXERIL) 10 MG tablet Take 0.5-1 tablets (5-10 mg total) by mouth 3 (three) times daily as needed. For headache 90 tablet 3  . furosemide (LASIX) 20 MG tablet TAKE 1 TABLET (20 MG TOTAL) BY MOUTH DAILY. 90 tablet 2  . Krill Oil (OMEGA-3) 500 MG CAPS Take 1 capsule by mouth daily.    Marland Kitchen lisinopril (PRINIVIL,ZESTRIL) 5 MG tablet TAKE 1/2 TABLET (2.5 MG TOTAL) BY MOUTH DAILY. 45 tablet 1  . magnesium gluconate (MAGONATE) 500 MG tablet Take 500 mg by mouth at bedtime.    . Misc Natural Products (TART  CHERRY ADVANCED) CAPS Take 1 capsule by mouth daily.    . naproxen sodium (ANAPROX) 220 MG tablet Take 440 mg by mouth 2 (two) times daily as needed (for headaches/pain).    . nitroGLYCERIN (NITROSTAT) 0.4 MG SL tablet Place 1 tablet (0.4 mg total) under the tongue every 5 (five) minutes as needed for chest pain. 25 tablet 3  . omeprazole (PRILOSEC) 40 MG capsule TAKE 1 CAPSULE EVERY DAY 90 capsule 0  . potassium chloride (K-DUR,KLOR-CON) 10 MEQ tablet TAKE 1 TABLET TWICE DAILY AS NEEDED 180 tablet 2  . promethazine (PHENERGAN) 25 MG tablet Take 0.5 tablets (12.5 mg total) by mouth every 8 (eight) hours as needed for nausea. 90 tablet 3  . venlafaxine XR (EFFEXOR-XR) 150 MG 24 hr capsule TAKE 1 CAPSULE EVERY DAY WITH BREAKFAST 90 capsule 0   No current facility-administered medications on file prior to visit.     Observations/Objective: Pt appeared well  No distress  R heel- 2 cm oval area of erythema and induration-no drainage seen  (limited by screen)  No severe swelling and pt can bear weight  No facial swelling or skin change Mood is good Answers questions appropriately     Assessment and Plan: Problem List Items Addressed This Visit      Other   Puncture wound of fifth toe of right foot    Right heel Stepped on a staple yesterday (near a cat box) Will get Tetanus shot here in the am  Sent in augmentin  Disc wound care- soap/water/loose dressing Watch for increased redness (streaks also) or swelling or fever and update [place]             Follow Up Instructions: Keep wound clean with soap and water Loosely cover  Come by tomorrow for a tetanus shot at planned Take augmentin to prevent infection  Please alert [place] if wound gets larger, more red, more swollen or painful or develops drainage or streaks of redness Update if not starting to improve in a week or if worsening  I discussed the assessment and treatment plan with the patient. The patient was provided an  opportunity to ask questions and all were answered. The patient agreed with the plan and demonstrated an understanding of the instructions.   The patient was advised to call back or seek an in-person evaluation if the symptoms worsen or if the condition fails to improve as anticipated.     Loura Pardon, MD

## 2019-04-02 NOTE — Telephone Encounter (Addendum)
Patient left a voicemail  stating that she stepped on a staple last night and the area is red and she needs a tetanus shot. Called patient back and was advised that she already has an appointment scheduled with Dr. Glori Bickers this afternoon by telephone.

## 2019-04-02 NOTE — Assessment & Plan Note (Signed)
Right heel Stepped on a staple yesterday (near a cat box) Will get Tetanus shot here in the am  Sent in augmentin  Disc wound care- soap/water/loose dressing Watch for increased redness (streaks also) or swelling or fever and update [place]

## 2019-04-03 ENCOUNTER — Other Ambulatory Visit: Payer: Self-pay

## 2019-04-03 ENCOUNTER — Ambulatory Visit: Payer: Self-pay | Admitting: Family Medicine

## 2019-04-03 ENCOUNTER — Ambulatory Visit (INDEPENDENT_AMBULATORY_CARE_PROVIDER_SITE_OTHER): Payer: Medicare HMO | Admitting: *Deleted

## 2019-04-03 DIAGNOSIS — Z23 Encounter for immunization: Secondary | ICD-10-CM | POA: Diagnosis not present

## 2019-04-03 DIAGNOSIS — S91134A Puncture wound without foreign body of right lesser toe(s) without damage to nail, initial encounter: Secondary | ICD-10-CM

## 2019-04-04 ENCOUNTER — Other Ambulatory Visit: Payer: Self-pay | Admitting: Family Medicine

## 2019-04-04 ENCOUNTER — Other Ambulatory Visit: Payer: Self-pay | Admitting: Cardiovascular Disease

## 2019-04-10 ENCOUNTER — Ambulatory Visit (INDEPENDENT_AMBULATORY_CARE_PROVIDER_SITE_OTHER): Payer: Medicare HMO | Admitting: Psychology

## 2019-04-10 DIAGNOSIS — F4323 Adjustment disorder with mixed anxiety and depressed mood: Secondary | ICD-10-CM

## 2019-04-11 ENCOUNTER — Ambulatory Visit: Payer: Medicare HMO | Admitting: Psychology

## 2019-04-16 ENCOUNTER — Encounter: Payer: Self-pay | Admitting: Family Medicine

## 2019-04-25 ENCOUNTER — Ambulatory Visit (INDEPENDENT_AMBULATORY_CARE_PROVIDER_SITE_OTHER): Payer: Medicare HMO | Admitting: Psychology

## 2019-04-25 DIAGNOSIS — F4323 Adjustment disorder with mixed anxiety and depressed mood: Secondary | ICD-10-CM | POA: Diagnosis not present

## 2019-04-30 DIAGNOSIS — J4520 Mild intermittent asthma, uncomplicated: Secondary | ICD-10-CM | POA: Diagnosis not present

## 2019-04-30 DIAGNOSIS — J4541 Moderate persistent asthma with (acute) exacerbation: Secondary | ICD-10-CM | POA: Diagnosis not present

## 2019-05-09 ENCOUNTER — Ambulatory Visit (INDEPENDENT_AMBULATORY_CARE_PROVIDER_SITE_OTHER): Payer: Medicare HMO | Admitting: Psychology

## 2019-05-09 DIAGNOSIS — F4323 Adjustment disorder with mixed anxiety and depressed mood: Secondary | ICD-10-CM | POA: Diagnosis not present

## 2019-05-23 ENCOUNTER — Ambulatory Visit (INDEPENDENT_AMBULATORY_CARE_PROVIDER_SITE_OTHER): Payer: Medicare HMO | Admitting: Psychology

## 2019-05-23 DIAGNOSIS — F4323 Adjustment disorder with mixed anxiety and depressed mood: Secondary | ICD-10-CM | POA: Diagnosis not present

## 2019-05-24 ENCOUNTER — Telehealth: Payer: Self-pay

## 2019-05-24 MED ORDER — FUROSEMIDE 20 MG PO TABS: ORAL_TABLET | ORAL | 0 refills | Status: DC

## 2019-05-24 NOTE — Telephone Encounter (Signed)
Requested Prescriptions   Signed Prescriptions Disp Refills  . furosemide (LASIX) 20 MG tablet 90 tablet 0    Sig: TAKE 1 TABLET (20 MG TOTAL) BY MOUTH DAILY.    Authorizing Provider: Minna Merritts    Ordering User: Raelene Bott, Kourtney Montesinos L

## 2019-05-31 DIAGNOSIS — J4520 Mild intermittent asthma, uncomplicated: Secondary | ICD-10-CM | POA: Diagnosis not present

## 2019-05-31 DIAGNOSIS — J4541 Moderate persistent asthma with (acute) exacerbation: Secondary | ICD-10-CM | POA: Diagnosis not present

## 2019-06-04 ENCOUNTER — Telehealth: Payer: Self-pay | Admitting: Family Medicine

## 2019-06-04 DIAGNOSIS — M810 Age-related osteoporosis without current pathological fracture: Secondary | ICD-10-CM

## 2019-06-04 DIAGNOSIS — E7800 Pure hypercholesterolemia, unspecified: Secondary | ICD-10-CM

## 2019-06-04 DIAGNOSIS — I10 Essential (primary) hypertension: Secondary | ICD-10-CM

## 2019-06-04 DIAGNOSIS — E559 Vitamin D deficiency, unspecified: Secondary | ICD-10-CM

## 2019-06-04 DIAGNOSIS — R7303 Prediabetes: Secondary | ICD-10-CM

## 2019-06-04 NOTE — Telephone Encounter (Signed)
-----   Message from Cloyd Stagers, RT sent at 05/31/2019  1:35 PM EDT ----- Regarding: Lab Orders for Tuesday 6.16.2020 Please place lab orders for Tuesday 6.16.2020, office visit for physical on Wednesday 7.1.2020 Thank you, Dyke Maes RT(R)

## 2019-06-05 ENCOUNTER — Other Ambulatory Visit: Payer: Medicare HMO

## 2019-06-05 ENCOUNTER — Ambulatory Visit (INDEPENDENT_AMBULATORY_CARE_PROVIDER_SITE_OTHER): Payer: Medicare HMO

## 2019-06-05 ENCOUNTER — Other Ambulatory Visit: Payer: Self-pay

## 2019-06-05 VITALS — BP 121/59 | HR 68 | Wt 208.0 lb

## 2019-06-05 DIAGNOSIS — Z0000 Encounter for general adult medical examination without abnormal findings: Secondary | ICD-10-CM

## 2019-06-05 NOTE — Progress Notes (Signed)
PCP notes:   Health maintenance:  Mammogram - addressed  Abnormal screenings:   Fall risk - hx of single fall Fall Risk  06/05/2019 12/07/2017 07/05/2016  Falls in the past year? 1 No No  Number falls in past yr: 0 - -  Injury with Fall? 1 - -    Patient concerns:   None  Nurse concerns:  None  Next PCP appt:   06/20/19 @ 0830  I reviewed health advisor's note, was available for consultation, and agree with documentation and plan. Loura Pardon MD

## 2019-06-05 NOTE — Progress Notes (Signed)
Subjective:   Rachel Torres is a 63 y.o. female who presents for Medicare Annual (Subsequent) preventive examination.  Review of Systems:  N/A Cardiac Risk Factors include: dyslipidemia;hypertension;obesity (BMI >30kg/m2)     Objective:     Vitals: BP (!) 121/59 Comment: patient supplied  Pulse 68 Comment: patient supplied  Wt 208 lb (94.3 kg) Comment: patient supplied  SpO2 98% Comment: patient supplied  BMI 34.61 kg/m   Body mass index is 34.61 kg/m.  Advanced Directives 06/05/2019 02/20/2018 01/21/2018 12/07/2017 09/04/2017 01/25/2017 08/07/2016  Does Patient Have a Medical Advance Directive? No Yes Yes Yes No Yes Yes  Type of Advance Directive - Public librarian;Living will [place];Living will - [place];Living will [place]  Does patient want to make changes to medical advance directive? No - Patient declined - No - Patient declined - - - -  Copy of [place] in Chart? - - No - copy requested No - copy requested - - No - copy requested  Would patient like information on creating a medical advance directive? No - Patient declined - No - Patient declined - - - -    Tobacco Social History   Tobacco Use  Smoking Status Former Smoker  . Quit date: 12/20/1978  . Years since quitting: 40.4  Smokeless Tobacco Never Used     Counseling given: No   Clinical Intake:  Pre-visit preparation completed: Yes  Pain Score: 2      Nutritional Status: BMI > 30  Obese Nutritional Risks: None  How often do you need to have someone help you when you read instructions, pamphlets, or other written materials from your doctor or pharmacy?: 1 - Never What is the last grade level you completed in school?: 12th grade + some college  Interpreter Needed?: No  Comments: pt lives with spouse Information entered by :: LPinson, RN  Past Medical History:  Diagnosis Date  . AAA (abdominal aortic  aneurysm) ([place])    a. Chronic w/o evidence of aneurysmal dil on CTA 01/2016.  Marland Kitchen Allergy   . Anemia   . Anxiety   . Asthma   . CAD (coronary artery disease)    a. 08/2010 s/p CABG x 1 (VG->RCA) @ time of Ao dissection repair; b. 01/2016 Lexiscan MV: mid antsept/apical defect w/ ? peri-infarct ischemia-->likely attenuation-->Med Rx.  . Chronic combined systolic (congestive) and diastolic (congestive) heart failure ([place])    a. 2012 EF 30-35%; b. 04/2015 EF 40-45%; c. 01/2016 Echo: Ef 55-65%, nl AoV bioprosthesis, nl RV, nl PASP.  Marland Kitchen Depression   . Gallbladder sludge   . GERD (gastroesophageal reflux disease)   . History of Bicuspid Aortic Valve    a. 08/2010 s/p AVR @ time of Ao dissection repair; b. 01/2016 Echo: Ef 55-65%, nl AoV bioprosthesis, nl RV, nl PASP.  Marland Kitchen Hx of repair of dissecting thoracic aortic aneurysm, [place] type A    a. 08/2010 s/p repair with AVR and VG->RCA; b. 01/2016 CTA: stable appearance of Asc Thoracic Aortic graft. Opacification of flase lumen of chronic abd Ao dissection w/ retrograde flow through lumbar arteries. No aneurysmal dil of flase lumen.  . Hyperlipidemia   . Hypertensive heart disease   . IBS (irritable bowel syndrome)   . Internal hemorrhoids   . Marfan syndrome    pt denies  . Migraine   . Obstructive sleep apnea   . Osteoporosis   . Pneumonia   .  RA (rheumatoid arthritis) ([place])    a. Followed by Dr. [name]   Past Surgical History:  Procedure Laterality Date  . ABDOMINAL AORTIC ANEURYSM REPAIR    . CARDIAC CATHETERIZATION  2001  . [place]  . CHOLECYSTECTOMY    . COLONOSCOPY    . FRACTURE SURGERY Right    shoulder replacement  . HEMORRHOID BANDING    . ORIF DISTAL RADIUS FRACTURE Left   . UPPER GASTROINTESTINAL ENDOSCOPY     Family History  Problem Relation Age of Onset  . Hypertension Mother   . Depression Mother   . Osteoporosis Mother   . Stroke Mother   . Irritable bowel syndrome Mother   . Asthma Mother   .  Arthritis Sister   . Diabetes Sister   . Heart disease Sister   . Asthma Sister   . Migraines Sister   . Nephrolithiasis Sister   . Osteoporosis Sister   . Arthritis Sister   . Asthma Sister   . Migraines Sister   . Other Father 64  . Stroke Maternal Grandmother   . Colon cancer Maternal Grandmother   . Lymphoma Maternal Grandmother   . Migraines Maternal Grandmother   . Aneurysm Paternal Grandmother        brain  . Diabetes Paternal Grandmother   . Arthritis Paternal Grandmother   . Arthritis Paternal Grandfather   . Diabetes Paternal Grandfather   . Other Brother        prediabeties  . Asthma Brother   . Migraines Brother   . Lung cancer Maternal Grandfather   . Melanoma Maternal Grandfather    Social History   Socioeconomic History  . Marital status: Married    Spouse name: Not on file  . Number of children: 2  . Years of education: Not on file  . Highest education level: Not on file  Occupational History  . Occupation: disabled    Employer: [employer]  Social Needs  . Financial resource strain: Not on file  . Food insecurity    Worry: Not on file    Inability: Not on file  . Transportation needs    Medical: Not on file    Non-medical: Not on file  Tobacco Use  . Smoking status: Former Smoker    Quit date: 12/20/1978    Years since quitting: 40.4  . Smokeless tobacco: Never Used  Substance and Sexual Activity  . Alcohol use: No    Alcohol/week: 0.0 standard drinks  . Drug use: No  . Sexual activity: Never  Lifestyle  . Physical activity    Days per week: Not on file    Minutes per session: Not on file  . Stress: Not on file  Relationships  . Social Herbalist on phone: Not on file    Gets together: Not on file    Attends religious service: Not on file    Active member of club or organization: Not on file    Attends meetings of clubs or organizations: Not on file    Relationship status: Not on file  Other Topics Concern  . Not on file   Social History Narrative    married for the second time, happily   On disability after dissection of aortic aneurysm   Husband has Parkinson's - deteriorating w/ dementia   4-6 caffeinated beverages daily   01/25/2018          Outpatient Encounter Medications as of 06/05/2019  Medication Sig  .  albuterol (PROVENTIL HFA;VENTOLIN HFA) 108 (90 Base) MCG/ACT inhaler Inhale 1-2 puffs into the lungs every 6 (six) hours as needed for wheezing or shortness of breath.  . ALPRAZolam (XANAX) 0.5 MG tablet Take 1 tablet (0.5 mg total) by mouth daily as needed for anxiety.  Marland Kitchen aspirin EC 81 MG tablet Take 81 mg by mouth at bedtime.  Marland Kitchen atorvastatin (LIPITOR) 20 MG tablet TAKE 1 TABLET EVERY DAY  . carvedilol (COREG) 6.25 MG tablet TAKE 1 TABLET TWICE DAILY WITH MEALS  . Cholecalciferol (VITAMIN D3) 5000 units CAPS Take 1 capsule by mouth daily.  . cyclobenzaprine (FLEXERIL) 10 MG tablet Take 0.5-1 tablets (5-10 mg total) by mouth 3 (three) times daily as needed. For headache  . furosemide (LASIX) 20 MG tablet TAKE 1 TABLET (20 MG TOTAL) BY MOUTH DAILY.  Marland Kitchen Krill Oil (OMEGA-3) 500 MG CAPS Take 1 capsule by mouth daily.  Marland Kitchen lisinopril (PRINIVIL,ZESTRIL) 5 MG tablet TAKE 1/2 TABLET (2.5 MG TOTAL) BY MOUTH DAILY.  . magnesium gluconate (MAGONATE) 500 MG tablet Take 500 mg by mouth at bedtime.  . Misc Natural Products (TART CHERRY ADVANCED) CAPS Take 1 capsule by mouth daily.  . naproxen sodium (ANAPROX) 220 MG tablet Take 440 mg by mouth 2 (two) times daily as needed (for headaches/pain).  . nitroGLYCERIN (NITROSTAT) 0.4 MG SL tablet Place 1 tablet (0.4 mg total) under the tongue every 5 (five) minutes as needed for chest pain.  Marland Kitchen omeprazole (PRILOSEC) 40 MG capsule TAKE 1 CAPSULE EVERY DAY  . potassium chloride (K-DUR,KLOR-CON) 10 MEQ tablet TAKE 1 TABLET TWICE DAILY AS NEEDED  . promethazine (PHENERGAN) 25 MG tablet Take 0.5 tablets (12.5 mg total) by mouth every 8 (eight) hours as needed for nausea.  Marland Kitchen  venlafaxine XR (EFFEXOR-XR) 150 MG 24 hr capsule TAKE 1 CAPSULE EVERY DAY WITH BREAKFAST  . [DISCONTINUED] amoxicillin-clavulanate (AUGMENTIN) 875-125 MG tablet Take 1 tablet by mouth 2 (two) times daily.  . [DISCONTINUED] b complex vitamins tablet Take 1 tablet by mouth daily.  . [DISCONTINUED] busPIRone (BUSPAR) 15 MG tablet Take 0.5 tablets (7.5 mg total) by mouth 2 (two) times daily.   No facility-administered encounter medications on file as of 06/05/2019.     Activities of Daily Living In your present state of health, do you have any difficulty performing the following activities: 06/05/2019  Hearing? Y  Vision? N  Difficulty concentrating or making decisions? N  Walking or climbing stairs? N  Dressing or bathing? N  Doing errands, shopping? N  Preparing Food and eating ? N  Using the Toilet? N  In the past six months, have you accidently leaked urine? N  Do you have problems with loss of bowel control? N  Managing your Medications? N  Managing your Finances? N  Housekeeping or managing your Housekeeping? N  Some recent data might be hidden    Patient Care Team: Tower, Wynelle Fanny, MD as PCP - General (Family Medicine)    Assessment:   This is a routine wellness examination for Norelle.   Hearing Screening   125Hz  250Hz  500Hz  1000Hz  2000Hz  3000Hz  4000Hz  6000Hz  8000Hz   Right ear:           Left ear:           Vision Screening Comments: Vision exam in 2020 with [facility]   Exercise Activities and Dietary recommendations Current Exercise Habits: The patient does not participate in regular exercise at present, Exercise limited by: None identified  Goals    . Patient Stated  Starting 06/05/19, I will continue to take medications as prescribed.        Fall Risk Fall Risk  06/05/2019 12/07/2017 07/05/2016  Falls in the past year? 1 No No  Number falls in past yr: 0 - -  Injury with Fall? 1 - -   Depression Screen PHQ 2/9 Scores 06/05/2019 12/07/2017 06/28/2017 07/05/2016   PHQ - 2 Score 2 0 1 0  PHQ- 9 Score 9 0 12 -     Cognitive Function MMSE - Mini Mental State Exam 06/05/2019 12/07/2017  Orientation to time 5 5  Orientation to Place 5 5  Registration 3 3  Attention/ Calculation 0 0  Recall 3 3  Language- name 2 objects 0 0  Language- repeat 1 1  Language- follow 3 step command 0 3  Language- read & follow direction 0 0  Write a sentence 0 0  Copy design 0 0  Total score 17 20     PLEASE NOTE: A Mini-Cog screen was completed. Maximum score is 17. A value of 0 denotes this part of [place]stein MMSE was not completed or the patient failed this part of the Mini-Cog screening.   Mini-Cog Screening Orientation to Time - Max 5 pts Orientation to Place - Max 5 pts Registration - Max 3 pts Recall - Max 3 pts Language Repeat - Max 1 pts    Immunization History  Administered Date(s) Administered  . H1N1 01/09/2009  . Influenza Split 09/20/2011, 09/11/2012  . Influenza Whole 12/29/2006, 09/19/2008, 10/08/2010  . Influenza, Seasonal, Injecte, Preservative Fre 10/08/2010  . Influenza,inj,Quad PF,6+ Mos 10/08/2013, 08/13/2016, 10/27/2017, 10/05/2018  . Influenza-Unspecified 09/26/2015  . Pneumococcal Polysaccharide-23 12/29/2006  . Td 01/09/2009, 04/03/2019   Screening Tests Health Maintenance  Topic Date Due  . MAMMOGRAM  12/20/2019 (Originally 02/24/2019)  . HIV Screening  01/22/2024 (Originally 10/17/1971)  . INFLUENZA VACCINE  07/21/2019  . PAP SMEAR-Modifier  11/30/2019  . COLONOSCOPY  01/19/2023  . TETANUS/TDAP  04/02/2029  . Hepatitis C Screening  Completed      Plan:     I have personally reviewed, addressed, and noted the following in the patient's chart:  A. Medical and social history B. Use of alcohol, tobacco or illicit drugs  C. Current medications and supplements D. Functional ability and status E.  Nutritional status F.  Physical activity G. Advance directives H. List of other physicians I.  Hospitalizations, surgeries,  and ER visits in previous 12 months J.  Vitals (unless it is a telemedicine encounter) K. Screenings to include cognitive, depression, hearing, vision (NOTE: hearing and vision screenings not completed in telemedicine encounter) L. Referrals and appointments   In addition, I have reviewed and discussed with patient certain preventive protocols, quality metrics, and best practice recommendations. A written personalized care plan for preventive services and recommendations were provided to patient.  With patient's permission, we connected on 06/05/19 at  9:30 AM EDT. Interactive audio and video telecommunications were attempted with patient. This attempt was unsuccessful due to patient having technical difficulties OR patient did not have access to video capability.  Encounter was completed with audio only.  Two patient identifiers were used to ensure the encounter occurred with the correct person. Patient was in home and writer was in office.   Signed,   Lindell Noe, MHA, BS, RN Health Coach

## 2019-06-05 NOTE — Patient Instructions (Signed)
Rachel Torres , Thank you for taking time to come for your Medicare Wellness Visit. I appreciate your ongoing commitment to your health goals. Please review the following plan we discussed and let me know if I can assist you in the future.   These are the goals we discussed: Goals    . Patient Stated     Starting 06/05/19, I will continue to take medications as prescribed.        This is a list of the screening recommended for you and due dates:  Health Maintenance  Topic Date Due  . Mammogram  12/20/2019*  . HIV Screening  01/22/2024*  . Flu Shot  07/21/2019  . Pap Smear  11/30/2019  . Colon Cancer Screening  01/19/2023  . Tetanus Vaccine  04/02/2029  .  Hepatitis C: One time screening is recommended by Center for Disease Control  (CDC) for  adults born from [birth year] through 1964.   Completed  *Topic was postponed. The date shown is not the original due date.   Preventive Care for Adults  A healthy lifestyle and preventive care can promote health and wellness. Preventive health guidelines for adults include the following key practices.  . A routine yearly physical is a good way to check with your health care provider about your health and preventive screening. It is a chance to share any concerns and updates on your health and to receive a thorough exam.  . Visit your dentist for a routine exam and preventive care every 6 months. Brush your teeth twice a day and floss once a day. Good oral hygiene prevents tooth decay and gum disease.  . The frequency of eye exams is based on your age, health, family medical history, use  of contact lenses, and other factors. Follow your health care provider's recommendations for frequency of eye exams.  . Eat a healthy diet. Foods like vegetables, fruits, whole grains, low-fat dairy products, and lean protein foods contain the nutrients you need without too many calories. Decrease your intake of foods high in solid fats, added sugars, and salt. Eat the  right amount of calories for you. Get information about a proper diet from your health care provider, if necessary.  . Regular physical exercise is one of the most important things you can do for your health. Most adults should get at least 150 minutes of moderate-intensity exercise (any activity that increases your heart rate and causes you to sweat) each week. In addition, most adults need muscle-strengthening exercises on 2 or more days a week.  Silver Sneakers may be a benefit available to you. To determine eligibility, you may visit the website: www.silversneakers.com or contact program at 804-066-1036 Mon-Fri between 8AM-8PM.   . Maintain a healthy weight. The body mass index (BMI) is a screening tool to identify possible weight problems. It provides an estimate of body fat based on height and weight. Your health care provider can find your BMI and can help you achieve or maintain a healthy weight.   For adults 20 years and older: ? A BMI below 18.5 is considered underweight. ? A BMI of 18.5 to 24.9 is normal. ? A BMI of 25 to 29.9 is considered overweight. ? A BMI of 30 and above is considered obese.   . Maintain normal blood lipids and cholesterol levels by exercising and minimizing your intake of saturated fat. Eat a balanced diet with plenty of fruit and vegetables. Blood tests for lipids and cholesterol should begin at age 78 and  be repeated every 5 years. If your lipid or cholesterol levels are high, you are over 50, or you are at high risk for heart disease, you may need your cholesterol levels checked more frequently. Ongoing high lipid and cholesterol levels should be treated with medicines if diet and exercise are not working.  . If you smoke, find out from your health care provider how to quit. If you do not use tobacco, please do not start.  . If you choose to drink alcohol, please do not consume more than 2 drinks per day. One drink is considered to be 12 ounces (355 mL) of  beer, 5 ounces (148 mL) of wine, or 1.5 ounces (44 mL) of liquor.  . If you are 110-48 years old, ask your health care provider if you should take aspirin to prevent strokes.  . Use sunscreen. Apply sunscreen liberally and repeatedly throughout the day. You should seek shade when your shadow is shorter than you. Protect yourself by wearing long sleeves, pants, a wide-brimmed hat, and sunglasses year round, whenever you are outdoors.  . Once a month, do a whole body skin exam, using a mirror to look at the skin on your back. Tell your health care provider of new moles, moles that have irregular borders, moles that are larger than a pencil eraser, or moles that have changed in shape or color.

## 2019-06-06 ENCOUNTER — Ambulatory Visit (INDEPENDENT_AMBULATORY_CARE_PROVIDER_SITE_OTHER): Payer: Medicare HMO | Admitting: Psychology

## 2019-06-06 DIAGNOSIS — F4323 Adjustment disorder with mixed anxiety and depressed mood: Secondary | ICD-10-CM

## 2019-06-07 ENCOUNTER — Other Ambulatory Visit: Payer: Self-pay

## 2019-06-07 ENCOUNTER — Other Ambulatory Visit (INDEPENDENT_AMBULATORY_CARE_PROVIDER_SITE_OTHER): Payer: Medicare HMO

## 2019-06-07 DIAGNOSIS — I10 Essential (primary) hypertension: Secondary | ICD-10-CM

## 2019-06-07 DIAGNOSIS — R7303 Prediabetes: Secondary | ICD-10-CM | POA: Diagnosis not present

## 2019-06-07 DIAGNOSIS — E559 Vitamin D deficiency, unspecified: Secondary | ICD-10-CM

## 2019-06-07 DIAGNOSIS — E7800 Pure hypercholesterolemia, unspecified: Secondary | ICD-10-CM | POA: Diagnosis not present

## 2019-06-07 LAB — CBC WITH DIFFERENTIAL/PLATELET
Basophils Absolute: 0 10*3/uL (ref 0.0–0.1)
Basophils Relative: 0.3 % (ref 0.0–3.0)
Eosinophils Absolute: 0.2 10*3/uL (ref 0.0–0.7)
Eosinophils Relative: 3.4 % (ref 0.0–5.0)
HCT: 37.4 % (ref 36.0–46.0)
Hemoglobin: 12.8 g/dL (ref 12.0–15.0)
Lymphocytes Relative: 29.5 % (ref 12.0–46.0)
Lymphs Abs: 1.4 10*3/uL (ref 0.7–4.0)
MCHC: 34.2 g/dL (ref 30.0–36.0)
MCV: 93.9 fl (ref 78.0–100.0)
Monocytes Absolute: 0.5 10*3/uL (ref 0.1–1.0)
Monocytes Relative: 9.7 % (ref 3.0–12.0)
Neutro Abs: 2.7 10*3/uL (ref 1.4–7.7)
Neutrophils Relative %: 57.1 % (ref 43.0–77.0)
Platelets: 248 10*3/uL (ref 150.0–400.0)
RBC: 3.98 Mil/uL (ref 3.87–5.11)
RDW: 13.2 % (ref 11.5–15.5)
WBC: 4.8 10*3/uL (ref 4.0–10.5)

## 2019-06-07 LAB — LIPID PANEL
Cholesterol: 137 mg/dL (ref 0–200)
HDL: 47.4 mg/dL (ref >39.00)
LDL Cholesterol: 68 mg/dL (ref 0–99)
NonHDL: 89.8
Total CHOL/HDL Ratio: 3
Triglycerides: 108 mg/dL (ref 0.0–149.0)
VLDL: 21.6 mg/dL (ref 0.0–40.0)

## 2019-06-07 LAB — COMPREHENSIVE METABOLIC PANEL
ALT: 18 U/L (ref 0–35)
AST: 20 U/L (ref 0–37)
Albumin: 4.2 g/dL (ref 3.5–5.2)
Alkaline Phosphatase: 64 U/L (ref 39–117)
BUN: 19 mg/dL (ref 6–23)
CO2: 27 mEq/L (ref 19–32)
Calcium: 8.9 mg/dL (ref 8.4–10.5)
Chloride: 103 mEq/L (ref 96–112)
Creatinine, Ser: 0.88 mg/dL (ref 0.40–1.20)
GFR: 65.19 mL/min (ref >60.00)
Glucose, Bld: 102 mg/dL — ABNORMAL HIGH (ref 70–99)
Potassium: 4.1 mEq/L (ref 3.5–5.1)
Sodium: 138 mEq/L (ref 135–145)
Total Bilirubin: 0.5 mg/dL (ref 0.2–1.2)
Total Protein: 6.7 g/dL (ref 6.0–8.3)

## 2019-06-07 LAB — TSH: TSH: 0.76 u[IU]/mL (ref 0.35–4.50)

## 2019-06-07 LAB — HEMOGLOBIN A1C: Hgb A1c MFr Bld: 6 % (ref 4.6–6.5)

## 2019-06-07 LAB — VITAMIN D 25 HYDROXY (VIT D DEFICIENCY, FRACTURES): VITD: 69.53 ng/mL (ref 30.00–100.00)

## 2019-06-08 ENCOUNTER — Encounter: Payer: Medicare HMO | Admitting: Family Medicine

## 2019-06-12 ENCOUNTER — Other Ambulatory Visit: Payer: Self-pay | Admitting: Cardiovascular Disease

## 2019-06-20 ENCOUNTER — Ambulatory Visit (INDEPENDENT_AMBULATORY_CARE_PROVIDER_SITE_OTHER): Payer: Medicare HMO | Admitting: Family Medicine

## 2019-06-20 ENCOUNTER — Ambulatory Visit (INDEPENDENT_AMBULATORY_CARE_PROVIDER_SITE_OTHER): Payer: Medicare HMO | Admitting: Psychology

## 2019-06-20 ENCOUNTER — Encounter: Payer: Self-pay | Admitting: Family Medicine

## 2019-06-20 ENCOUNTER — Other Ambulatory Visit: Payer: Self-pay

## 2019-06-20 VITALS — BP 136/72 | HR 73 | Wt 207.0 lb

## 2019-06-20 DIAGNOSIS — M810 Age-related osteoporosis without current pathological fracture: Secondary | ICD-10-CM | POA: Diagnosis not present

## 2019-06-20 DIAGNOSIS — Z1231 Encounter for screening mammogram for malignant neoplasm of breast: Secondary | ICD-10-CM

## 2019-06-20 DIAGNOSIS — F4323 Adjustment disorder with mixed anxiety and depressed mood: Secondary | ICD-10-CM

## 2019-06-20 DIAGNOSIS — E669 Obesity, unspecified: Secondary | ICD-10-CM | POA: Diagnosis not present

## 2019-06-20 DIAGNOSIS — I480 Paroxysmal atrial fibrillation: Secondary | ICD-10-CM

## 2019-06-20 DIAGNOSIS — F418 Other specified anxiety disorders: Secondary | ICD-10-CM | POA: Diagnosis not present

## 2019-06-20 DIAGNOSIS — I714 Abdominal aortic aneurysm, without rupture, unspecified: Secondary | ICD-10-CM

## 2019-06-20 DIAGNOSIS — E7800 Pure hypercholesterolemia, unspecified: Secondary | ICD-10-CM

## 2019-06-20 DIAGNOSIS — I10 Essential (primary) hypertension: Secondary | ICD-10-CM | POA: Diagnosis not present

## 2019-06-20 DIAGNOSIS — B977 Papillomavirus as the cause of diseases classified elsewhere: Secondary | ICD-10-CM

## 2019-06-20 DIAGNOSIS — E559 Vitamin D deficiency, unspecified: Secondary | ICD-10-CM

## 2019-06-20 DIAGNOSIS — R7303 Prediabetes: Secondary | ICD-10-CM | POA: Diagnosis not present

## 2019-06-20 MED ORDER — CYCLOBENZAPRINE HCL 10 MG PO TABS: 5.0000 mg | ORAL_TABLET | Freq: Three times a day (TID) | ORAL | 1 refills | Status: DC | PRN

## 2019-06-20 MED ORDER — ALPRAZOLAM 0.5 MG PO TABS: 0.5000 mg | ORAL_TABLET | Freq: Every day | ORAL | 0 refills | Status: DC | PRN

## 2019-06-20 MED ORDER — PROMETHAZINE HCL 25 MG PO TABS: 12.5000 mg | ORAL_TABLET | Freq: Three times a day (TID) | ORAL | 1 refills | Status: DC | PRN

## 2019-06-20 NOTE — Assessment & Plan Note (Signed)
Clinically stable Followed by vascular

## 2019-06-20 NOTE — Progress Notes (Signed)
Virtual Visit via Video Note  I connected with Ziya C Gossen on 06/20/19 at  8:30 AM EDT by a video enabled telemedicine application and verified that I am speaking with the correct person using two identifiers.  Location: Patient: home Provider: office    I discussed the limitations of evaluation and management by telemedicine and the availability of in person appointments. The patient expressed understanding and agreed to proceed.  History of Present Illness: Here for health maintenance exam and to review chronic medical problems  amw was on 6/16 Noted one fall this year   Doing pretty good  She is taking care of herself  Has been staying home during pandemic   Trigger finger- R 5th finger Knee hurts  She will make an appt with Dr copland when needed    Weight 207 lb  After she gained 10 lb  Back to loosing again -better Wt Readings from Last 3 Encounters:  06/05/19 208 lb (94.3 kg)  02/05/19 207 lb 3 oz (94 kg)  01/12/19 211 lb (95.7 kg)  active within the house - does not get outside in the hot weather   Mammogram 3/18- needs to have one /wants to schedule  She goes to [facility]  Self breast exam -no lumps   Pap 12/17  Neg with pos HPV (but not the high risk type)  Overdue for pap   Colonoscopy 1/14 with 10 y recall   OP- osteopenia last dexa 3/18 Intol of fosamax  D level is 69.5- taking supplements   No recent fx Has had a fall  Broken toe a year ago    Cardiac hx : AAA and CHF and CHF and PAF Everything is stable right now-no c/o   bp is stable today  No cp or palpitations or headaches or edema  No side effects to medicines  BP Readings from Last 3 Encounters:  06/05/19 (!) 121/59  02/05/19 138/72  01/12/19 126/82    This am 136/72 -right after medication    Lab Results  Component Value Date   CREATININE 0.88 06/07/2019   BUN 19 06/07/2019   NA 138 06/07/2019   K 4.1 06/07/2019   CL 103 06/07/2019   CO2 27 06/07/2019   Lab Results   Component Value Date   ALT 18 06/07/2019   AST 20 06/07/2019   ALKPHOS 64 06/07/2019   BILITOT 0.5 06/07/2019    Lab Results  Component Value Date   TSH 0.76 06/07/2019   Lab Results  Component Value Date   WBC 4.8 06/07/2019   HGB 12.8 06/07/2019   HCT 37.4 06/07/2019   MCV 93.9 06/07/2019   PLT 248.0 06/07/2019    Mood/ dep and anx- overall stable  Continues her effexor xr - works well - coping very well    Prediabetes Lab Results  Component Value Date   HGBA1C 6.0 06/07/2019  last time was 6.1  She is watching diet for sweets and carbs  Trying to eat vegetables    Cholesterol Lab Results  Component Value Date   CHOL 137 06/07/2019   CHOL 166 06/26/2018   CHOL 193 11/24/2017   Lab Results  Component Value Date   HDL 47.40 06/07/2019   HDL 56.70 06/26/2018   HDL 63.20 11/24/2017   Lab Results  Component Value Date   LDLCALC 68 06/07/2019   LDLCALC 87 06/26/2018   LDLCALC 114 (H) 11/24/2017   Lab Results  Component Value Date   TRIG 108.0 06/07/2019   TRIG  111.0 06/26/2018   TRIG 78.0 11/24/2017   Lab Results  Component Value Date   CHOLHDL 3 06/07/2019   CHOLHDL 3 06/26/2018   CHOLHDL 3 11/24/2017   Lab Results  Component Value Date   LDLDIRECT 189.4 10/13/2011   LDLDIRECT 163.3 05/05/2009   LDLDIRECT 146.4 01/09/2009   statin and diet  Tolerates it well usually-occ takes a holiday if she gets muscle cramps for a few days  Not eating a lot of fatty foods / fried foods    Patient Active Problem List   Diagnosis Date Noted  . Puncture wound of fifth toe of right foot 04/02/2019  . PAF (paroxysmal atrial fibrillation) ([place]) 02/05/2019  . HPV (human papilloma virus) infection 12/01/2016  . Screening mammogram, encounter for 11/29/2016  . Estrogen deficiency 11/29/2016  . Encounter for routine gynecological examination 11/29/2016  . Grade III hemorrhoids 10/06/2016  . Tachycardia 08/14/2016  . Hemorrhoids 08/13/2016  . Atypical migraine  08/03/2016  . CAD (coronary artery disease)   . AAA (abdominal aortic aneurysm) ([place])   . Chronic combined systolic (congestive) and diastolic (congestive) heart failure ([place])   . Prediabetes 02/11/2016  . Generalized anxiety disorder 12/08/2015  . Panic attacks 07/28/2015  . Sensorineural hearing loss 03/27/2013  . Vitamin D deficiency 07/03/2012  . H/O aortic dissection 05/12/2012  . Obesity (BMI 30.0-34.9) 11/21/2011  . Routine general medical examination at a health care facility 09/20/2011  . S/P AVR (aortic valve replacement) 01/07/2011  . CORONARY ARTERY BYPASS GRAFT, HX OF 01/07/2011  . Arthritis 12/20/2010  . H/O dissecting abdominal aortic aneurysm repair 08/24/2010  . IRRITABLE BOWEL SYNDROME 09/09/2009  . Obstructive sleep apnea 08/04/2009  . External hemorrhoid 06/26/2009  . Osteoporosis 01/09/2009  . Hyperlipidemia 07/26/2007  . Depression with anxiety 07/26/2007  . Essential hypertension 07/26/2007  . Allergic rhinitis 07/26/2007  . Asthma 07/26/2007  . GERD 07/26/2007  . Osteoarthritis 07/26/2007  . Ocular migraine 07/26/2007  . Mild intermittent asthma without complication 03/50/0938   Past Medical History:  Diagnosis Date  . AAA (abdominal aortic aneurysm) ([place])    a. Chronic w/o evidence of aneurysmal dil on CTA 01/2016.  Marland Kitchen Allergy   . Anemia   . Anxiety   . Asthma   . CAD (coronary artery disease)    a. 08/2010 s/p CABG x 1 (VG->RCA) @ time of Ao dissection repair; b. 01/2016 Lexiscan MV: mid antsept/apical defect w/ ? peri-infarct ischemia-->likely attenuation-->Med Rx.  . Chronic combined systolic (congestive) and diastolic (congestive) heart failure ([place])    a. 2012 EF 30-35%; b. 04/2015 EF 40-45%; c. 01/2016 Echo: Ef 55-65%, nl AoV bioprosthesis, nl RV, nl PASP.  Marland Kitchen Depression   . Gallbladder sludge   . GERD (gastroesophageal reflux disease)   . History of Bicuspid Aortic Valve    a. 08/2010 s/p AVR @ time of Ao dissection repair; b. 01/2016 Echo: Ef  55-65%, nl AoV bioprosthesis, nl RV, nl PASP.  Marland Kitchen Hx of repair of dissecting thoracic aortic aneurysm, Stanford type A    a. 08/2010 s/p repair with AVR and VG->RCA; b. 01/2016 CTA: stable appearance of Asc Thoracic Aortic graft. Opacification of flase lumen of chronic abd Ao dissection w/ retrograde flow through lumbar arteries. No aneurysmal dil of flase lumen.  . Hyperlipidemia   . Hypertensive heart disease   . IBS (irritable bowel syndrome)   . Internal hemorrhoids   . Marfan syndrome    pt denies  . Migraine   . Obstructive sleep apnea   .  Osteoporosis   . Pneumonia   . RA (rheumatoid arthritis) ([place])    a. Followed by Dr. [name]   Past Surgical History:  Procedure Laterality Date  . ABDOMINAL AORTIC ANEURYSM REPAIR    . CARDIAC CATHETERIZATION  2001  . [place]  . CHOLECYSTECTOMY    . COLONOSCOPY    . FRACTURE SURGERY Right    shoulder replacement  . HEMORRHOID BANDING    . ORIF DISTAL RADIUS FRACTURE Left   . UPPER GASTROINTESTINAL ENDOSCOPY     Social History   Tobacco Use  . Smoking status: Former Smoker    Quit date: 12/20/1978    Years since quitting: 40.5  . Smokeless tobacco: Never Used  Substance Use Topics  . Alcohol use: No    Alcohol/week: 0.0 standard drinks  . Drug use: No   Family History  Problem Relation Age of Onset  . Hypertension Mother   . Depression Mother   . Osteoporosis Mother   . Stroke Mother   . Irritable bowel syndrome Mother   . Asthma Mother   . Arthritis Sister   . Diabetes Sister   . Heart disease Sister   . Asthma Sister   . Migraines Sister   . Nephrolithiasis Sister   . Osteoporosis Sister   . Arthritis Sister   . Asthma Sister   . Migraines Sister   . Other Father 10  . Stroke Maternal Grandmother   . Colon cancer Maternal Grandmother   . Lymphoma Maternal Grandmother   . Migraines Maternal Grandmother   . Aneurysm Paternal Grandmother        brain  . Diabetes Paternal Grandmother   .  Arthritis Paternal Grandmother   . Arthritis Paternal Grandfather   . Diabetes Paternal Grandfather   . Other Brother        prediabeties  . Asthma Brother   . Migraines Brother   . Lung cancer Maternal Grandfather   . Melanoma Maternal Grandfather    Allergies  Allergen Reactions  . Fosamax [Alendronate Sodium]     GI upset   . Ginzing [Ginseng] Other (See Comments)    Hyper and jitery   . Hydrocod Polst-Cpm Polst Er Itching  . Hydrocodone-Acetaminophen Itching  . Imitrex [Sumatriptan]     Contraindicated w/ heart condition  . Oxycodone Itching  . Peanut-Containing Drug Products Other (See Comments)    Reaction:  Migraines   . Tramadol Other (See Comments)    itching  . Hydrocodone-Guaifenesin Itching  . Prednisone Rash  . Tape Itching   Current Outpatient Medications on File Prior to Visit  Medication Sig Dispense Refill  . albuterol (PROVENTIL HFA;VENTOLIN HFA) 108 (90 Base) MCG/ACT inhaler Inhale 1-2 puffs into the lungs every 6 (six) hours as needed for wheezing or shortness of breath. 1 Inhaler 0  . aspirin EC 81 MG tablet Take 81 mg by mouth at bedtime.    Marland Kitchen atorvastatin (LIPITOR) 20 MG tablet TAKE 1 TABLET EVERY DAY 90 tablet 3  . carvedilol (COREG) 6.25 MG tablet TAKE 1 TABLET TWICE DAILY WITH MEALS 180 tablet 0  . Cholecalciferol (VITAMIN D3) 5000 units CAPS Take 1 capsule by mouth daily.    . furosemide (LASIX) 20 MG tablet TAKE 1 TABLET (20 MG TOTAL) BY MOUTH DAILY. 90 tablet 0  . Krill Oil (OMEGA-3) 500 MG CAPS Take 1 capsule by mouth daily.    Marland Kitchen lisinopril (PRINIVIL,ZESTRIL) 5 MG tablet TAKE 1/2 TABLET (2.5 MG TOTAL) BY MOUTH DAILY.  45 tablet 1  . magnesium gluconate (MAGONATE) 500 MG tablet Take 500 mg by mouth at bedtime.    . Misc Natural Products (TART CHERRY ADVANCED) CAPS Take 1 capsule by mouth daily.    . naproxen sodium (ANAPROX) 220 MG tablet Take 440 mg by mouth 2 (two) times daily as needed (for headaches/pain).    . nitroGLYCERIN (NITROSTAT) 0.4  MG SL tablet Place 1 tablet (0.4 mg total) under the tongue every 5 (five) minutes as needed for chest pain. 25 tablet 3  . omeprazole (PRILOSEC) 40 MG capsule TAKE 1 CAPSULE EVERY DAY 90 capsule 1  . potassium chloride (K-DUR,KLOR-CON) 10 MEQ tablet TAKE 1 TABLET TWICE DAILY AS NEEDED 180 tablet 2  . venlafaxine XR (EFFEXOR-XR) 150 MG 24 hr capsule TAKE 1 CAPSULE EVERY DAY WITH BREAKFAST 90 capsule 1   No current facility-administered medications on file prior to visit.     Review of Systems  Constitutional: Negative for chills, fever, malaise/fatigue and weight loss.  Eyes: Negative for blurred vision, discharge and redness.  Respiratory: Negative for cough and shortness of breath.   Cardiovascular: Negative for chest pain, palpitations, leg swelling and PND.  Gastrointestinal: Negative for blood in stool, heartburn and nausea.  Musculoskeletal: Positive for joint pain. Negative for myalgias.  Skin: Negative for rash.  Neurological: Negative for dizziness, tingling and headaches.  Endo/Heme/Allergies: Negative for polydipsia.  Psychiatric/Behavioral: Negative for memory loss. The patient does not have insomnia.        Stressors- doing ok     Observations/Objective: Patient appears well, in no distress Weight is baseline (obese) No facial swelling or asymmetry Normal voice-not hoarse and no slurred speech No obvious tremor or mobility impairment Moving neck and UEs normally Able to hear the call well  No cough or shortness of breath during interview  Talkative and mentally sharp with no cognitive changes No skin changes on face or neck , no rash or pallor Affect is normal    Assessment and Plan: Problem List Items Addressed This Visit      Cardiovascular and Mediastinum   Essential hypertension - Primary    bp in fair control at this time  BP Readings from Last 1 Encounters:  06/20/19 136/72   No changes needed Most recent labs reviewed  Disc lifstyle change with low  sodium diet and exercise        AAA (abdominal aortic aneurysm) ([facility])    Clinically stable Followed by vascular       PAF (paroxysmal atrial fibrillation) ([place])    NSR today  follwed by cardiology        Musculoskeletal and Integument   Osteoporosis    Last dexa 3/18 showed osteopenia Good D level Intolerant of fosamax No recent fractures (one fall) Enc regular exercise         Other   Hyperlipidemia    Fairly controlled with statin and diet  Disc goals for lipids and reasons to control them Rev last labs with pt Rev low sat fat diet in detail  LDL of 68      Depression with anxiety    Doing very well with effexor xr despite recent stressors Reviewed stressors/ coping techniques/symptoms/ support sources/ tx options and side effects in detail today       Relevant Medications   ALPRAZolam (XANAX) 0.5 MG tablet   Vitamin D deficiency    Vitamin D level is therapeutic with current supplementation Disc importance of this to bone and overall health Level  of 69.5      Prediabetes    Lab Results  Component Value Date   HGBA1C 6.0 06/07/2019   disc imp of low glycemic diet and wt loss to prevent DM2       Obesity (BMI 30.0-34.9)    Discussed how this problem influences overall health and the risks it imposes  Reviewed plan for weight loss with lower calorie diet (via better food choices and also portion control or program like weight watchers) and exercise building up to or more than 30 minutes 5 days per week including some aerobic activity         Screening mammogram, encounter for    Scheduled annual screening mammogram  Encouraged monthly self exams   Will do breast exam at f/u for gyn      Relevant Orders   MM 3D SCREEN BREAST BILATERAL   HPV (human papilloma virus) infection    Overdue for gyn exam and pap test Will call to schedule           Follow Up Instructions: The office will call you to schedule a mammogram  Also to schedule a gyn  exam and pap with me    I discussed the assessment and treatment plan with the patient. The patient was provided an opportunity to ask questions and all were answered. The patient agreed with the plan and demonstrated an understanding of the instructions.   The patient was advised to call back or seek an in-person evaluation if the symptoms worsen or if the condition fails to improve as anticipated.     Loura Pardon, MD

## 2019-06-20 NOTE — Assessment & Plan Note (Signed)
Overdue for gyn exam and pap test Will call to schedule

## 2019-06-20 NOTE — Assessment & Plan Note (Signed)
NSR today  follwed by cardiology

## 2019-06-20 NOTE — Assessment & Plan Note (Signed)
Vitamin D level is therapeutic with current supplementation Disc importance of this to bone and overall health Level of 69.5

## 2019-06-20 NOTE — Assessment & Plan Note (Signed)
Scheduled annual screening mammogram  Encouraged monthly self exams   Will do breast exam at f/u for gyn

## 2019-06-20 NOTE — Assessment & Plan Note (Signed)
Discussed how this problem influences overall health and the risks it imposes  Reviewed plan for weight loss with lower calorie diet (via better food choices and also portion control or program like weight watchers) and exercise building up to or more than 30 minutes 5 days per week including some aerobic activity    

## 2019-06-20 NOTE — Assessment & Plan Note (Signed)
Last dexa 3/18 showed osteopenia Good D level Intolerant of fosamax No recent fractures (one fall) Enc regular exercise

## 2019-06-20 NOTE — Assessment & Plan Note (Signed)
bp in fair control at this time  BP Readings from Last 1 Encounters:  06/20/19 136/72   No changes needed Most recent labs reviewed  Disc lifstyle change with low sodium diet and exercise

## 2019-06-20 NOTE — Assessment & Plan Note (Signed)
Fairly controlled with statin and diet  Disc goals for lipids and reasons to control them Rev last labs with pt Rev low sat fat diet in detail  LDL of 68

## 2019-06-20 NOTE — Assessment & Plan Note (Signed)
Doing very well with effexor xr despite recent stressors Reviewed stressors/ coping techniques/symptoms/ support sources/ tx options and side effects in detail today

## 2019-06-20 NOTE — Patient Instructions (Addendum)
The office will call you to schedule a mammogram  Also to schedule a gyn exam and pap with me

## 2019-06-20 NOTE — Assessment & Plan Note (Signed)
Lab Results  Component Value Date   HGBA1C 6.0 06/07/2019   disc imp of low glycemic diet and wt loss to prevent DM2

## 2019-06-30 DIAGNOSIS — J4541 Moderate persistent asthma with (acute) exacerbation: Secondary | ICD-10-CM | POA: Diagnosis not present

## 2019-06-30 DIAGNOSIS — J4520 Mild intermittent asthma, uncomplicated: Secondary | ICD-10-CM | POA: Diagnosis not present

## 2019-07-04 ENCOUNTER — Ambulatory Visit (INDEPENDENT_AMBULATORY_CARE_PROVIDER_SITE_OTHER): Payer: Medicare HMO | Admitting: Psychology

## 2019-07-04 DIAGNOSIS — F4323 Adjustment disorder with mixed anxiety and depressed mood: Secondary | ICD-10-CM | POA: Diagnosis not present

## 2019-07-09 ENCOUNTER — Other Ambulatory Visit: Payer: Self-pay

## 2019-07-09 ENCOUNTER — Encounter: Payer: Self-pay | Admitting: Physician Assistant

## 2019-07-09 ENCOUNTER — Other Ambulatory Visit: Payer: Self-pay | Admitting: Cardiovascular Disease

## 2019-07-09 ENCOUNTER — Ambulatory Visit (INDEPENDENT_AMBULATORY_CARE_PROVIDER_SITE_OTHER): Payer: Medicare HMO | Admitting: Physician Assistant

## 2019-07-09 VITALS — BP 120/82 | HR 78 | Temp 97.0°F | Ht 65.0 in | Wt 207.8 lb

## 2019-07-09 DIAGNOSIS — Z952 Presence of prosthetic heart valve: Secondary | ICD-10-CM | POA: Diagnosis not present

## 2019-07-09 DIAGNOSIS — I359 Nonrheumatic aortic valve disorder, unspecified: Secondary | ICD-10-CM | POA: Diagnosis not present

## 2019-07-09 DIAGNOSIS — I2510 Atherosclerotic heart disease of native coronary artery without angina pectoris: Secondary | ICD-10-CM

## 2019-07-09 DIAGNOSIS — R0600 Dyspnea, unspecified: Secondary | ICD-10-CM

## 2019-07-09 DIAGNOSIS — J4520 Mild intermittent asthma, uncomplicated: Secondary | ICD-10-CM

## 2019-07-09 DIAGNOSIS — I7103 Dissection of thoracoabdominal aorta: Secondary | ICD-10-CM | POA: Diagnosis not present

## 2019-07-09 DIAGNOSIS — E785 Hyperlipidemia, unspecified: Secondary | ICD-10-CM

## 2019-07-09 DIAGNOSIS — I10 Essential (primary) hypertension: Secondary | ICD-10-CM | POA: Diagnosis not present

## 2019-07-09 NOTE — Telephone Encounter (Signed)
Patient scheduled.

## 2019-07-09 NOTE — Patient Instructions (Signed)
Medication Instructions:  Your physician recommends that you continue on your current medications as directed. Please refer to the Current Medication list given to you today.  If you need a refill on your cardiac medications before your next appointment, please call your pharmacy.   Lab work: None ordered  If you have labs (blood work) drawn today and your tests are completely normal, you will receive your results only by: Marland Kitchen MyChart Message (if you have MyChart) OR . A paper copy in the mail If you have any lab test that is abnormal or we need to change your treatment, we will call you to review the results.  Testing/Procedures: 1- Echo  Please return to [facility] on ______________ at _______________ AM/PM for an Echocardiogram. Your physician has requested that you have an echocardiogram. Echocardiography is a painless test that uses sound waves to create images of your heart. It provides your doctor with information about the size and shape of your heart and how well your heart's chambers and valves are working. This procedure takes approximately one hour. There are no restrictions for this procedure. Please note; depending on visual quality an IV may need to be placed.    Follow-Up: At [facility], you and your health needs are our priority.  As part of our continuing mission to provide you with exceptional heart care, we have created designated Provider Care Teams.  These Care Teams include your primary Cardiologist (physician) and Advanced Practice Providers (APPs -  Physician Assistants and Nurse Practitioners) who all work together to provide you with the care you need, when you need it. You will need a follow up appointment in 6 months.  Please call our office 2 months in advance to schedule this appointment.  You may see Dr. [name] or Christell Faith, PA-C.   Any Other Special Instructions Will Be Listed Below (If Applicable). Referral to pulmonology

## 2019-07-09 NOTE — Addendum Note (Signed)
Addended by: Verlon Au on: 07/09/2019 04:14 PM   Modules accepted: Orders

## 2019-07-09 NOTE — Telephone Encounter (Signed)
Please schedule 12 mo F/U appointment. Thank you!

## 2019-07-09 NOTE — Progress Notes (Signed)
Cardiology Office Note Date:  07/09/2019  Patient ID:  Rachel Torres, Rachel Torres 1956-01-25, MRN [id number] PCP:  [name], MD  Cardiologist:  Dr. [name], MD    Chief Complaint: Follow up  History of Present Illness: Rachel Torres is a 63 y.o. female with history of CAD s/p 1 vessel CABG with SVG-RCA in 08/2010 coupled with type a ascending aortic dissection repair and bioprosthetic AVR at the same time with residual descending aortic dissection extending into the iliac artery followed by vascular surgery, patient reported SVT vs Afib in the 1990s s/p self reported ablation with details unclear, marfan syndrome, asthma, hypertensive heart disease, chronic combined CHF with subsequent normalization of her EF, migraine disorder, OSA not on CPAP, back pain, and anxiety who presents for follow up of her CAD.   Nuclear stress test at [facility] in 08/2012 without ischemia and EF of 52%. Prior echo from 04/2015 showed an EF of 40-45% which was improved from prior. Most recent echo from 01/2016 showed normalization of her EF to 39-76%, LV diastolic function was normal, septal motion c/w post-operative state, normal functioning bioprosthetic arotic valve without stenosis, PASP normal. She was admitted in 2017 with chest pain and ruled out. She underwent Lexiscan Myoview that was low risk with probably attenuation artifact. She continued to note symptoms, though they seemed to be associated with food intake. GI work up showed a small hiatal hernia and several small polyps. Symptoms improved with GI cocktail. She was seen in the ED in 01/2018 and told she had Afib. She was prescribed Coumadin through the ED, though she did not start this. Upon her cardiologist reviewing the EKGs, she was noted to have sinus rhythm with PACs. There was no evidence of Afib. Echo at that time showed an EF of 50-55%, anteroseptal HK, Gr2DD, bioprosthetic aortic valve was noted, mild MR, mildly dilated left atrium, RVSF normal, PASP  normal. She was last seen in the office in 06/2018, noting increased stress at home and weight gain in the setting of poor diet. Since she was last seen, she has been seen in the ED and admitted several times for asthma exacerbations.  Though now, she wonders if she actually had COVID at that time.  She comes in doing reasonably well from a cardiac perspective.  She continues to have stable exertional shortness of breath that she attributes to her obesity and physical deconditioning.  She denies any chest pain, palpitations, presyncope, or syncope.  She does note some dizziness if she bends over.  She has not followed up with vascular surgery and needs to make an appointment with them.  Her blood pressure remains well controlled in the 1 teens to 734 systolic.  She denies any lower extremity swelling, abdominal distention, orthopnea, PND, early satiety.  She states her weight had peaked at 216 pounds approximately 1 month ago though she has subsequently discontinued almost all carbs outside of this past weekend when she visited with her son and had quite a few desserts.  No falls, BRBPR, or melena.  She continues to have significant amount of stressors at home with the help of her husband and mother.  Labs: 05/2019 - HGB 12.8, PLT 248, K+ 4.1, SCr 0.88, AST/ALT normal, albumin 4.2, A1c 6.0, LDL 68, TSH normal   Past Medical History:  Diagnosis Date   AAA (abdominal aortic aneurysm) ([place])    a. Chronic w/o evidence of aneurysmal dil on CTA 01/2016.   Allergy    Anemia  Anxiety    Asthma    CAD (coronary artery disease)    a. 08/2010 s/p CABG x 1 (VG->RCA) @ time of Ao dissection repair; b. 01/2016 Lexiscan MV: mid antsept/apical defect w/ ? peri-infarct ischemia-->likely attenuation-->Med Rx.   Chronic combined systolic (congestive) and diastolic (congestive) heart failure ([place])    a. 2012 EF 30-35%; b. 04/2015 EF 40-45%; c. 01/2016 Echo: Ef 55-65%, nl AoV bioprosthesis, nl RV, nl PASP.    Depression    Gallbladder sludge    GERD (gastroesophageal reflux disease)    History of Bicuspid Aortic Valve    a. 08/2010 s/p AVR @ time of Ao dissection repair; b. 01/2016 Echo: Ef 55-65%, nl AoV bioprosthesis, nl RV, nl PASP.   Hx of repair of dissecting thoracic aortic aneurysm, Stanford type A    a. 08/2010 s/p repair with AVR and VG->RCA; b. 01/2016 CTA: stable appearance of Asc Thoracic Aortic graft. Opacification of flase lumen of chronic abd Ao dissection w/ retrograde flow through lumbar arteries. No aneurysmal dil of flase lumen.   Hyperlipidemia    Hypertensive heart disease    IBS (irritable bowel syndrome)    Internal hemorrhoids    Marfan syndrome    pt denies   Migraine    Obstructive sleep apnea    Osteoporosis    Pneumonia    RA (rheumatoid arthritis) ([place])    a. Followed by Dr. [name]    Past Surgical History:  Procedure Laterality Date   ABDOMINAL AORTIC ANEURYSM [place] Right    shoulder replacement   HEMORRHOID BANDING     ORIF DISTAL RADIUS FRACTURE Left    UPPER GASTROINTESTINAL ENDOSCOPY      Current Meds  Medication Sig   albuterol (PROVENTIL HFA;VENTOLIN HFA) 108 (90 Base) MCG/ACT inhaler Inhale 1-2 puffs into the lungs every 6 (six) hours as needed for wheezing or shortness of breath.   ALPRAZolam (XANAX) 0.5 MG tablet Take 1 tablet (0.5 mg total) by mouth daily as needed for anxiety.   aspirin EC 81 MG tablet Take 81 mg by mouth at bedtime.   atorvastatin (LIPITOR) 20 MG tablet TAKE 1 TABLET EVERY DAY   carvedilol (COREG) 6.25 MG tablet TAKE 1 TABLET TWICE DAILY WITH MEALS   Cholecalciferol (VITAMIN D3) 5000 units CAPS Take 1 capsule by mouth daily.   cyclobenzaprine (FLEXERIL) 10 MG tablet Take 0.5-1 tablets (5-10 mg total) by mouth 3 (three) times daily as needed. For headache   furosemide  (LASIX) 20 MG tablet TAKE 1 TABLET (20 MG TOTAL) BY MOUTH DAILY.   Krill Oil (OMEGA-3) 500 MG CAPS Take 1 capsule by mouth daily.   lisinopril (PRINIVIL,ZESTRIL) 5 MG tablet TAKE 1/2 TABLET (2.5 MG TOTAL) BY MOUTH DAILY.   magnesium gluconate (MAGONATE) 500 MG tablet Take 500 mg by mouth at bedtime.   Misc Natural Products (TART CHERRY ADVANCED) CAPS Take 1 capsule by mouth daily.   naproxen sodium (ANAPROX) 220 MG tablet Take 440 mg by mouth 2 (two) times daily as needed (for headaches/pain).   nitroGLYCERIN (NITROSTAT) 0.4 MG SL tablet Place 1 tablet (0.4 mg total) under the tongue every 5 (five) minutes as needed for chest pain.   omeprazole (PRILOSEC) 40 MG capsule TAKE 1 CAPSULE EVERY DAY   potassium chloride (K-DUR) 10 MEQ tablet TAKE 1 TABLET TWICE DAILY AS  NEEDED   promethazine (PHENERGAN) 25 MG tablet Take 0.5 tablets (12.5 mg total) by mouth every 8 (eight) hours as needed for nausea.   venlafaxine XR (EFFEXOR-XR) 150 MG 24 hr capsule TAKE 1 CAPSULE EVERY DAY WITH BREAKFAST    Allergies:   Fosamax [alendronate sodium], Ginzing [ginseng], Hydrocod polst-cpm polst er, Hydrocodone-acetaminophen, Imitrex [sumatriptan], Oxycodone, Peanut-containing drug products, Tramadol, Hydrocodone-guaifenesin, Prednisone, and Tape   Social History:  The patient  reports that she quit smoking about 40 years ago. She has never used smokeless tobacco. She reports that she does not drink alcohol or use drugs.   Family History:  The patient's family history includes Aneurysm in her paternal grandmother; Arthritis in her paternal grandfather, paternal grandmother, sister, and sister; Asthma in her brother, mother, sister, and sister; Colon cancer in her maternal grandmother; Depression in her mother; Diabetes in her paternal grandfather, paternal grandmother, and sister; Heart disease in her sister; Hypertension in her mother; Irritable bowel syndrome in her mother; Lung cancer in her maternal  grandfather; Lymphoma in her maternal grandmother; Melanoma in her maternal grandfather; Migraines in her brother, maternal grandmother, sister, and sister; Nephrolithiasis in her sister; Osteoporosis in her mother and sister; Other in her brother; Other (age of onset: 48) in her father; Stroke in her maternal grandmother and mother.  ROS:   Review of Systems  Constitutional: Positive for malaise/fatigue. Negative for chills, diaphoresis, fever and weight loss.  HENT: Negative for congestion.   Eyes: Negative for discharge and redness.  Respiratory: Positive for shortness of breath. Negative for cough, hemoptysis, sputum production and wheezing.   Cardiovascular: Negative for chest pain, palpitations, orthopnea, claudication, leg swelling and PND.  Gastrointestinal: Negative for abdominal pain, blood in stool, heartburn, melena, nausea and vomiting.  Genitourinary: Negative for hematuria.  Musculoskeletal: Negative for falls and myalgias.  Skin: Negative for rash.  Neurological: Negative for dizziness, tingling, tremors, sensory change, speech change, focal weakness, loss of consciousness and weakness.  Endo/Heme/Allergies: Does not bruise/bleed easily.  Psychiatric/Behavioral: Negative for substance abuse. The patient is not nervous/anxious.   All other systems reviewed and are negative.    PHYSICAL EXAM:  VS:  BP 120/82 (BP Location: Left Arm, Patient Position: Sitting, Cuff Size: Normal)    Pulse 78    Temp (!) 97 F (36.1 C)    Ht 5\' 5"  (1.651 m)    Wt 207 lb 12 oz (94.2 kg)    SpO2 95%    BMI 34.57 kg/m  BMI: Body mass index is 34.57 kg/m.  Physical Exam  Constitutional: She is oriented to person, place, and time. She appears well-developed and well-nourished.  HENT:  Head: Normocephalic and atraumatic.  Eyes: Right eye exhibits no discharge. Left eye exhibits no discharge.  Neck: Normal range of motion. No JVD present.  Cardiovascular: Normal rate, regular rhythm, S1 normal and  S2 normal. Exam reveals no distant heart sounds, no friction rub, no midsystolic click and no opening snap.  Murmur heard. Pulses:      Dorsalis pedis pulses are 2+ on the right side and 2+ on the left side.       Posterior tibial pulses are 2+ on the right side and 2+ on the left side.  II/VI systolic murmur best heard in the aortic area  Pulmonary/Chest: Effort normal and breath sounds normal. No respiratory distress. She has no decreased breath sounds. She has no wheezes. She has no rales. She exhibits no tenderness.  Abdominal: Soft. She exhibits no distension. There is no  abdominal tenderness.  Musculoskeletal:        General: No edema.  Neurological: She is alert and oriented to person, place, and time.  Skin: Skin is warm and dry. No cyanosis. Nails show no clubbing.  Psychiatric: She has a normal mood and affect. Her speech is normal and behavior is normal. Judgment and thought content normal.     EKG:  Was ordered and interpreted by me today. Shows NSR, 78 bpm, first-degree AV block, nonspecific anterolateral ST-T changes (grossly unchanged from prior)  Recent Labs: 06/07/2019: ALT 18; BUN 19; Creatinine, Ser 0.88; Hemoglobin 12.8; Platelets 248.0; Potassium 4.1; Sodium 138; TSH 0.76  06/07/2019: Cholesterol 137; HDL 47.40; LDL Cholesterol 68; Total CHOL/HDL Ratio 3; Triglycerides 108.0; VLDL 21.6   CrCl cannot be calculated (Patient's most recent lab result is older than the maximum 21 days allowed.).   Wt Readings from Last 3 Encounters:  07/09/19 207 lb 12 oz (94.2 kg)  06/20/19 207 lb (93.9 kg)  06/05/19 208 lb (94.3 kg)     Other studies reviewed: Additional studies/records reviewed today include: summarized above  ASSESSMENT AND PLAN:  1. CAD status post CABG without angina: She is doing well without any symptoms concerning for angina.  Continue aspirin, Coreg, and Lipitor.  She does have some exertional shortness of breath though this is stable over the past 12 months  and she has attributed this to her obesity, physical deconditioning, and asthma.  I did offer her stress testing for further evaluation though she declines this at this time.  We will proceed with an echocardiogram as outlined below.  2. Aortic valve disease status post bioprosthetic AVR: Well-functioning on echo from 01/2018.  Check echo.  3. Type A aortic dissection status post repair with residual descending aortic dissection extending into the iliac artery: Followed by vascular surgery.  Optimal blood pressure, heart rate, and lipid control.  I recommend she contact vascular surgery this week to schedule her follow-up appointment and imaging.  She agrees to do so.  4. Hyperlipidemia: LDL 68 last month.  Remains on Lipitor.  5. Exertional dyspnea/asthma: Refer to pulmonology for PFTs and further evaluation.  Check echo as above.  She declines stress testing as above.  6. Obesity: Weight loss is advised.  Disposition: F/u with Dr. [name] or an APP in 6 to 12 months.  Current medicines are reviewed at length with the patient today.  The patient did not have any concerns regarding medicines.  Signed, Christell Faith, PA-C 07/09/2019 3:39 PM     [address] [place], [place] [address] [phone]

## 2019-07-11 ENCOUNTER — Ambulatory Visit (INDEPENDENT_AMBULATORY_CARE_PROVIDER_SITE_OTHER): Payer: Medicare HMO | Admitting: Family Medicine

## 2019-07-11 ENCOUNTER — Encounter: Payer: Self-pay | Admitting: Family Medicine

## 2019-07-11 ENCOUNTER — Other Ambulatory Visit: Payer: Self-pay

## 2019-07-11 VITALS — BP 114/74 | HR 78 | Temp 98.4°F | Ht 65.5 in | Wt 210.0 lb

## 2019-07-11 DIAGNOSIS — M65352 Trigger finger, left little finger: Secondary | ICD-10-CM

## 2019-07-11 DIAGNOSIS — M65351 Trigger finger, right little finger: Secondary | ICD-10-CM

## 2019-07-11 MED ORDER — METHYLPREDNISOLONE ACETATE 40 MG/ML IJ SUSP
20.0000 mg | Freq: Once | INTRAMUSCULAR | Status: AC
  Administered 2019-07-11: 20 mg via INTRA_ARTICULAR

## 2019-07-11 NOTE — Progress Notes (Signed)
     Karenann Mcgrory T. Srinidhi Landers, MD Primary Care and [place] at [facility] [place], [address] Phone: [phone]  FAX: [place] - 63 y.o. female  MRN [id number]  Date of Birth: Jul 23, 1956  Visit Date: 07/11/2019  PCP: [name], MD  Referred by: Tower, Wynelle Fanny, MD  Chief Complaint  Patient presents with  . Hand Pain    both   Subjective:   Bernell C Schexnider is a 63 y.o. very pleasant female patient who presents with the following:  B 5th digit trigger. Classic presentation.  Tendon Sheath Injection Procedure Note Melainie C Kassab 1956-06-07 Date of procedure: 07/11/2019  Procedure: Tendon Sheath Injection for Trigger Finger, RIGHT Indications: Pain  Procedure Details Verbal consent was obtained. Risks (including rare risk of infection, potential risk for skin lightening and potential atrophy), benefits and alternatives were discussed. Prepped with Chloraprep and Ethyl Chloride used for anesthesia. Under sterile conditions, patient injected at palmar crease aiming distally with 45 degree angle towards nodule; injected directly into tendon sheath. Medication flowed freely without resistance.  Needle size: 22 gauge 1 1/2 inch Injection: 1/2 cc of Lidocaine 1% and Depo-Medrol 20 mg Medication: Depo-Medrol 20 mg   Tendon Sheath Injection Procedure Note Sharnise C Bosher [birth year] Date of procedure: 07/11/2019  Procedure: Tendon Sheath Injection for Trigger Finger, LEFT Indications: Pain  Procedure Details Verbal consent was obtained. Risks (including rare risk of infection, potential risk for skin lightening and potential atrophy), benefits and alternatives were discussed. Prepped with Chloraprep and Ethyl Chloride used for anesthesia. Under sterile conditions, patient injected at palmar crease aiming distally with 45 degree angle towards nodule; injected directly into tendon sheath.  Medication flowed freely without resistance.  Needle size: 22 gauge 1 1/2 inch Injection: 1/2 cc of Lidocaine 1% and Depo-Medrol 20 mg Medication: Depo-Medrol 20 mg    Signed,  Ladaysha Soutar T. Kamilia Carollo, MD

## 2019-07-12 ENCOUNTER — Encounter: Payer: Self-pay | Admitting: Family Medicine

## 2019-07-16 ENCOUNTER — Telehealth: Payer: Self-pay | Admitting: Internal Medicine

## 2019-07-16 NOTE — Telephone Encounter (Signed)
Called patient for COVID-19 pre-screening for in office visit. ° °Have you recently traveled any where out of the local area in the last 2 weeks? No ° °Have you been in close contact with a person diagnosed with COVID-19 or someone awaiting results within the last 2 weeks? No ° °Do you currently have any of the following symptoms? If so, when did they start? °Cough     Diarrhea   Joint Pain °Fever      Muscle Pain   Red eyes °Shortness of breath   Abdominal pain  Vomiting °Loss of smell    Rash    Sore Throat °Headache    Weakness   Bruising or bleeding ° ° °Okay to proceed with visit 07/17/2019  ° ° °

## 2019-07-17 ENCOUNTER — Other Ambulatory Visit: Payer: Self-pay

## 2019-07-17 ENCOUNTER — Ambulatory Visit (INDEPENDENT_AMBULATORY_CARE_PROVIDER_SITE_OTHER): Payer: Medicare HMO | Admitting: Internal Medicine

## 2019-07-17 ENCOUNTER — Encounter: Payer: Self-pay | Admitting: Internal Medicine

## 2019-07-17 VITALS — BP 142/78 | HR 76 | Temp 97.4°F | Ht 65.0 in | Wt 208.0 lb

## 2019-07-17 DIAGNOSIS — G4733 Obstructive sleep apnea (adult) (pediatric): Secondary | ICD-10-CM

## 2019-07-17 DIAGNOSIS — R0602 Shortness of breath: Secondary | ICD-10-CM

## 2019-07-17 DIAGNOSIS — J4520 Mild intermittent asthma, uncomplicated: Secondary | ICD-10-CM

## 2019-07-17 NOTE — Patient Instructions (Signed)
ALBUTEROL AS NEEDED Check 6MWT Check CXR 2 view Check PFT's  Obtain Sleep Study

## 2019-07-17 NOTE — Progress Notes (Signed)
Name: Rachel Torres MRN: [id number] DOB: Feb 24, 1956     CONSULTATION DATE: 07/17/2019  REFERRING MD : Idolina Primer    CARDIAC HISTORY - history of CAD s/p 1 vessel CABG with SVG-RCA in 08/2010 coupled with type a ascending aortic dissection repair and bioprosthetic AVR at the same time with residual descending aortic dissection extending into the iliac artery followed by vascular surgery, patient reported SVT vs Afib in the 1990s s/p self reported ablation with details unclear, marfan syndrome, asthma, hypertensive heart disease, chronic combined CHF with subsequent normalization of her EF, migraine disorder, OSA not on CPAP, back pain, and anxiety    Nuclear stress test at [facility] in 08/2012 without ischemia and EF of 52%  echo from 04/2015 showed an EF of 40-45% which was improved from prior. - echo from 01/2016 showed normalization of her EF to 94-85%, LV diastolic function was normal, septal motion c/w post-operative state, normal functioning bioprosthetic arotic valve without stenosis, PASP normal. She was admitted in 2017 with chest pain and ruled out. She underwent Lexiscan Myoview that was low risk with probably attenuation artifact. She continued to note symptoms, though they seemed to be associated with food intake. GI work up showed a small hiatal hernia and several small polyps. Symptoms improved with GI cocktail. She was seen in the ED in 01/2018 and told she had Afib. She was prescribed Coumadin through the ED, though she did not start this. Upon her cardiologist reviewing the EKGs, she was noted to have sinus rhythm with PACs. There was no evidence of Afib.  01/2018 Echo at that time showed an EF of 50-55%, anteroseptal HK, Gr2DD, bioprosthetic aortic valve was noted, mild MR, mildly dilated left atrium, RVSF normal, PASP normal.  She was last seen in the office in 06/2018, noting increased stress at home and weight gain in the setting of poor diet. Since she was last seen, she has been seen in  the ED and admitted several times for asthma exacerbations.    She continues to have stable exertional shortness of breath that she attributes to her obesity and physical deconditioning.    She denies any chest pain, palpitations, presyncope, or syncope.  She does note some dizziness if she bends over.  She has not followed up with vascular surgery and needs to make an appointment with them.  Her blood pressure remains well controlled in the 1 teens to 462 systolic.  She denies any lower extremity swelling, abdominal distention, orthopnea, PND, early satiety.  She states her weight had peaked at 216 pounds approximately 1 month ago though she has subsequently discontinued almost all carbs outside of this past weekend when she visited with her son and had quite a few desserts.  No falls, BRBPR, or melena.  She continues to have significant amount of stressors at home with the help of her husband and mother.   CHIEF COMPLAINT: SOB   HISTORY OF PRESENT ILLNESS: 63 year old pleasant white female seen today for chronic shortness of breath over the last 6 months  Patient has had no significant cardiac history in 2011  In February 2020 patient was hospitalized for pneumonia and was diagnosed with influenza pneumonia She was diagnosed with respiratory failure placed on oxygen therapy  Since her discharge from the hospital she had increased shortness of breath and dyspnea exertion no chest pain no palpitations Has intermittent productive cough but no wheezing She was prescribed nebulizers and has had helped tremendously but inhalers did not help She has had 4-5  pneumonias in the last 4 to 5 years  Patient states she was diagnosed with asthma in her 52s however there are no triggers no history of eczema Patient works with perfumes and candles and scents and there is no triggers at this time  Patient is a non-smoker however had secondhand smoke exposure for approximately 10 years  She has a family  history of asthma Patient has been diagnosed with sleep apnea for the last 6 years however is noncompliant for the last 4 years   History of excessive daytime sleepiness Patient  has been having sleep problems for many years Patient has been having excessive daytime sleepiness for a long time Patient has been having extreme fatigue and tiredness, lack of energy +  very Loud snoring every night  She has been diagnosed with sleep apnea 6 years ago She stopped using her CPAP machine 4 years ago Patient will need  evaluation of her sleep apnea   Discussed sleep data and reviewed with patient.  Encouraged proper weight management.  Discussed driving precautions and its relationship with hypersomnolence.  Discussed operating dangerous equipment and its relationship with hypersomnolence.  Discussed sleep hygiene, and benefits of a fixed sleep waked time.  The importance of getting eight or more hours of sleep discussed with patient.  Discussed limiting the use of the computer and television before bedtime.  Decrease naps during the day, so night time sleep will become enhanced.  Limit caffeine, and sleep deprivation.  HTN, stroke, and heart failure are potential risk factors.    EPWORTH SLEEP SCORE 10    PAST MEDICAL HISTORY :   has a past medical history of AAA (abdominal aortic aneurysm) ([place]), Allergy, Anemia, Anxiety, Asthma, CAD (coronary artery disease), Chronic combined systolic (congestive) and diastolic (congestive) heart failure ([place]), Depression, Gallbladder sludge, GERD (gastroesophageal reflux disease), History of Bicuspid Aortic Valve, repair of dissecting thoracic aortic aneurysm, Stanford type A, Hyperlipidemia, Hypertensive heart disease, IBS (irritable bowel syndrome), Internal hemorrhoids, Marfan syndrome, Migraine, Obstructive sleep apnea, Osteoporosis, Pneumonia, and RA (rheumatoid arthritis) ([place]).  has a past surgical history that includes Cesarean section ([place]); Cardiac catheterization (2001); Fracture surgery (Right); ORIF distal radius fracture (Left); Abdominal aortic aneurysm repair; Cholecystectomy; Colonoscopy; Upper gastrointestinal endoscopy; and Hemorrhoid banding. Prior to Admission medications   Medication Sig Start Date End Date Taking? Authorizing Provider  albuterol (PROVENTIL HFA;VENTOLIN HFA) 108 (90 Base) MCG/ACT inhaler Inhale 1-2 puffs into the lungs every 6 (six) hours as needed for wheezing or shortness of breath. 01/12/19  Yes Pleas Koch, NP  ALPRAZolam Duanne Moron) 0.5 MG tablet Take 1 tablet (0.5 mg total) by mouth daily as needed for anxiety. 06/20/19  Yes Tower, Wynelle Fanny, MD  aspirin EC 81 MG tablet Take 81 mg by mouth at bedtime.   Yes [provider]  atorvastatin (LIPITOR) 20 MG tablet TAKE 1 TABLET EVERY DAY 04/04/19  Yes Gollan, Kathlene November, MD  carvedilol (COREG) 6.25 MG tablet TAKE 1 TABLET TWICE DAILY WITH MEALS 07/09/19  Yes Gollan, Kathlene November, MD  Cholecalciferol (VITAMIN D3) 5000 units CAPS Take 1 capsule by mouth daily.   Yes [provider]  cyclobenzaprine (FLEXERIL) 10 MG tablet Take 0.5-1 tablets (5-10 mg total) by mouth 3 (three) times daily as needed. For headache 06/20/19  Yes Tower, Roque Lias A, MD  furosemide (LASIX) 20 MG tablet TAKE 1 TABLET (20 MG TOTAL) BY MOUTH DAILY. 05/24/19  Yes Gollan, Kathlene November, MD  Krill Oil (OMEGA-3) 500 MG CAPS Take 1  capsule by mouth daily.   Yes [provider]  lisinopril (PRINIVIL,ZESTRIL) 5 MG tablet TAKE 1/2 TABLET (2.5 MG TOTAL) BY MOUTH DAILY. 02/20/19  Yes Gollan, Kathlene November, MD  magnesium gluconate (MAGONATE) 500 MG tablet Take 500 mg by mouth at bedtime.   Yes [provider]  Misc Natural Products (TART CHERRY ADVANCED) CAPS Take 1 capsule by mouth daily.   Yes [provider]  naproxen sodium (ANAPROX) 220 MG tablet Take 440 mg by mouth 2 (two) times daily as needed (for headaches/pain).   Yes [provider]  nitroGLYCERIN  (NITROSTAT) 0.4 MG SL tablet Place 1 tablet (0.4 mg total) under the tongue every 5 (five) minutes as needed for chest pain. 04/26/17  Yes Minna Merritts, MD  omeprazole (PRILOSEC) 40 MG capsule TAKE 1 CAPSULE EVERY DAY 04/05/19  Yes Tower, Wynelle Fanny, MD  potassium chloride (K-DUR) 10 MEQ tablet TAKE 1 TABLET TWICE DAILY AS NEEDED 07/09/19  Yes Gollan, Kathlene November, MD  promethazine (PHENERGAN) 25 MG tablet Take 0.5 tablets (12.5 mg total) by mouth every 8 (eight) hours as needed for nausea. 06/20/19  Yes Tower, Wynelle Fanny, MD  venlafaxine XR (EFFEXOR-XR) 150 MG 24 hr capsule TAKE 1 CAPSULE EVERY DAY WITH BREAKFAST 04/05/19  Yes Tower, Wynelle Fanny, MD   Allergies  Allergen Reactions  . Fosamax [Alendronate Sodium]     GI upset   . Ginzing [Ginseng] Other (See Comments)    Hyper and jitery   . Hydrocod Polst-Cpm Polst Er Itching  . Hydrocodone-Acetaminophen Itching  . Imitrex [Sumatriptan]     Contraindicated w/ heart condition  . Oxycodone Itching  . Peanut-Containing Drug Products Other (See Comments)    Reaction:  Migraines   . Tramadol Other (See Comments)    itching  . Hydrocodone-Guaifenesin Itching  . Prednisone Rash  . Tape Itching    FAMILY HISTORY:  family history includes Aneurysm in her paternal grandmother; Arthritis in her paternal grandfather, paternal grandmother, sister, and sister; Asthma in her brother, mother, sister, and sister; Colon cancer in her maternal grandmother; Depression in her mother; Diabetes in her paternal grandfather, paternal grandmother, and sister; Heart disease in her sister; Hypertension in her mother; Irritable bowel syndrome in her mother; Lung cancer in her maternal grandfather; Lymphoma in her maternal grandmother; Melanoma in her maternal grandfather; Migraines in her brother, maternal grandmother, sister, and sister; Nephrolithiasis in her sister; Osteoporosis in her mother and sister; Other in her brother; Other (age of onset: 19) in her father; Stroke in  her maternal grandmother and mother. SOCIAL HISTORY:  reports that she quit smoking about 40 years ago. Her smoking use included cigarettes. She has a 1.13 pack-year smoking history. She has never used smokeless tobacco. She reports current alcohol use. She reports that she does not use drugs.    Review of Systems:  Gen:  Denies  fever, sweats, chills weigh loss  HEENT: Denies blurred vision, double vision, ear pain, eye pain, hearing loss, nose bleeds, sore throat Cardiac:  No dizziness, chest pain or heaviness, chest tightness,edema, No JVD Resp:  + cough, -sputum production, +shortness of breath,-wheezing, -hemoptysis,  Gi: Denies swallowing difficulty, stomach pain, nausea or vomiting, diarrhea, constipation, bowel incontinence Gu:  Denies bladder incontinence, burning urine Ext:   Denies Joint pain, stiffness or swelling Skin: Denies  skin rash, easy bruising or bleeding or hives Endoc:  Denies polyuria, polydipsia , polyphagia or weight change Psych:   Denies depression, insomnia or hallucinations  Other:  All  other systems negative    BP (!) 142/78 (BP Location: Left Arm, Cuff Size: Normal)   Pulse 76   SpO2 96%     SpO2: 96 % O2 Device: None (Room air)    Physical Examination:   GENERAL:NAD, no fevers, chills, no weakness no fatigue HEAD: Normocephalic, atraumatic.  EYES: PERLA, EOMI No scleral icterus.  MOUTH: Moist mucosal membrane.  EAR, NOSE, THROAT: Clear without exudates. No external lesions.  NECK: Supple. No thyromegaly.  No JVD.  PULMONARY: CTA B/L no wheezing, rhonchi, crackles CARDIOVASCULAR: S1 and S2. Regular rate and rhythm. No murmurs GASTROINTESTINAL: Soft, nontender, nondistended. Positive bowel sounds.  MUSCULOSKELETAL: No swelling, clubbing, or edema.  NEUROLOGIC: No gross focal neurological deficits. 5/5 strength all extremities SKIN: No ulceration, lesions, rashes, or cyanosis.  PSYCHIATRIC: Insight, judgment intact. -depression -anxiety  ALL OTHER ROS ARE NEGATIVE   MEDICATIONS: I have reviewed all medications and confirmed regimen as documented     IMAGING   Chest x-ray February 2019 reviewed by me today No active disease no pneumonia no effusions no masses Normal chest x-ray findings    ASSESSMENT AND PLAN SYNOPSIS  62 year old pleasant white female seen today for increased shortness of breath and dyspnea on exertion with a recent history of influenza pneumonia and hospitalization requiring oxygen therapy in the setting of reactive airways disease from recurrent upper respiratory tract infections with chronic allergic rhinitis in the setting of obesity and underlying noncompliant obstructive sleep apnea  At this time her diagnosis of asthma is questionable due to the fact that she has no triggers and has cats at home and no history of eczema  At this time I would recommend further evaluation of her shortness of breath with pulmonary function testing 6-minute walk test a chest x-ray with 2 views  Patient will also need home sleep study to reevaluate sleep apnea  No indication for inhalers at this time No indication for steroids at this time No indication for antibiotics at this time  Will need to follow-up after tests are completed  Follow-up with cardiology as scheduled  COVID-19 EDUCATION: The signs and symptoms of COVID-19 were discussed with the patient and how to seek care for testing.  The importance of social distancing was discussed today. Hand Washing Techniques and avoid touching face was advised.  MEDICATION ADJUSTMENTS/LABS AND TESTS ORDERED: Chest x-ray two-view Pulmonary function testing 6-minute walk test   CURRENT MEDICATIONS REVIEWED AT LENGTH WITH PATIENT TODAY   Patient satisfied with Plan of action and management. All questions answered  Follow up in 3 months   Eleonore Shippee Patricia Pesa, M.D.  [facility]  Medical Director [place]  Director [facility] Cardio-Pulmonary Department

## 2019-07-18 ENCOUNTER — Ambulatory Visit: Payer: Medicare HMO | Admitting: Psychology

## 2019-07-18 ENCOUNTER — Ambulatory Visit (INDEPENDENT_AMBULATORY_CARE_PROVIDER_SITE_OTHER): Payer: Medicare HMO

## 2019-07-18 DIAGNOSIS — R0609 Other forms of dyspnea: Secondary | ICD-10-CM | POA: Diagnosis not present

## 2019-07-18 DIAGNOSIS — R0600 Dyspnea, unspecified: Secondary | ICD-10-CM

## 2019-07-18 NOTE — Progress Notes (Signed)
  SIX MIN WALK 07/18/2019  Medications Lipitor 20mg , Coreg 6.25, Vit D3 171mcg, Lasix 20mg , Omega-3 500mg , Zestril 5mg , prilosec 40mg , k-dur 10 meq, and effexor 150mg  all taken at 8:00a.  Supplimental Oxygen during Test? (L/min) No  Laps 13  Partial Lap (in Meters) 12  Baseline BP (sitting) 124/76  Baseline Heartrate 75  Baseline Dyspnea (Borg Scale) 2  Baseline Fatigue (Borg Scale) 1  Baseline SPO2 96  BP (sitting) 140/82  Heartrate 92  Dyspnea (Borg Scale) 5  Fatigue (Borg Scale) 5  SPO2 97  BP (sitting) 132/78  Heartrate 77  SPO2 97  Interpretation Hip pain  Distance Completed 454  Tech Comments: pt completed test at moderate to fast pace with c/o sob. steady gait with no desats.

## 2019-07-23 ENCOUNTER — Ambulatory Visit
Admission: RE | Admit: 2019-07-23 | Discharge: 2019-07-23 | Disposition: A | Discharge status: Home / Self Care | Payer: Medicare HMO | Source: Ambulatory Visit | Attending: Internal Medicine | Admitting: Internal Medicine

## 2019-07-23 DIAGNOSIS — J4520 Mild intermittent asthma, uncomplicated: Secondary | ICD-10-CM | POA: Diagnosis not present

## 2019-07-23 DIAGNOSIS — R0602 Shortness of breath: Secondary | ICD-10-CM

## 2019-07-24 ENCOUNTER — Other Ambulatory Visit: Payer: Self-pay

## 2019-07-24 DIAGNOSIS — I71019 Dissection of thoracic aorta, unspecified: Secondary | ICD-10-CM

## 2019-07-24 DIAGNOSIS — I7101 Dissection of thoracic aorta: Secondary | ICD-10-CM

## 2019-07-27 ENCOUNTER — Other Ambulatory Visit: Payer: Self-pay | Admitting: Cardiovascular Disease

## 2019-07-31 DIAGNOSIS — J4541 Moderate persistent asthma with (acute) exacerbation: Secondary | ICD-10-CM | POA: Diagnosis not present

## 2019-07-31 DIAGNOSIS — J4520 Mild intermittent asthma, uncomplicated: Secondary | ICD-10-CM | POA: Diagnosis not present

## 2019-08-01 ENCOUNTER — Ambulatory Visit (INDEPENDENT_AMBULATORY_CARE_PROVIDER_SITE_OTHER): Payer: Medicare HMO | Admitting: Psychology

## 2019-08-01 DIAGNOSIS — F4323 Adjustment disorder with mixed anxiety and depressed mood: Secondary | ICD-10-CM | POA: Diagnosis not present

## 2019-08-02 ENCOUNTER — Ambulatory Visit (INDEPENDENT_AMBULATORY_CARE_PROVIDER_SITE_OTHER): Payer: Medicare HMO

## 2019-08-02 ENCOUNTER — Other Ambulatory Visit: Payer: Self-pay | Admitting: Cardiovascular Disease

## 2019-08-02 ENCOUNTER — Other Ambulatory Visit: Payer: Medicare HMO

## 2019-08-02 ENCOUNTER — Other Ambulatory Visit: Payer: Self-pay

## 2019-08-02 DIAGNOSIS — R0600 Dyspnea, unspecified: Secondary | ICD-10-CM | POA: Diagnosis not present

## 2019-08-02 MED ORDER — PERFLUTREN LIPID MICROSPHERE
1.0000 mL | INTRAVENOUS | Status: AC | PRN
  Administered 2019-08-02: 2 mL via INTRAVENOUS

## 2019-08-03 ENCOUNTER — Ambulatory Visit: Payer: Medicare HMO

## 2019-08-03 ENCOUNTER — Telehealth: Payer: Self-pay

## 2019-08-03 DIAGNOSIS — G4733 Obstructive sleep apnea (adult) (pediatric): Secondary | ICD-10-CM

## 2019-08-03 NOTE — Telephone Encounter (Signed)
Call to patient to discuss lab results and POC from Rachel Torres, [place].   Pt verbalized understanding and made appt with Dr. [name] for 9/21.   Advised pt to call for any further questions or concerns.

## 2019-08-03 NOTE — Telephone Encounter (Signed)
-----   Message from Rise Mu, PA-C sent at 08/03/2019  7:17 AM EDT ----- Echo showed slightly reduced pump function with a slightly stiffened heart. Bioprosthetic aortic valve was noted and normal. Compared to prior echo, pump function is slightly lower. She is already on good medications. Please have her schedule an appointment with Dr. [name] in the next month or two to see if any further testing is needed based on symptoms.

## 2019-08-07 ENCOUNTER — Telehealth: Payer: Self-pay | Admitting: Internal Medicine

## 2019-08-07 DIAGNOSIS — G4733 Obstructive sleep apnea (adult) (pediatric): Secondary | ICD-10-CM | POA: Diagnosis not present

## 2019-08-07 NOTE — Telephone Encounter (Signed)
HST performed on 08/04/2019 showed borderline OSA with AHI of 4.5. Recommend repeat HST or in lab study is OSA is suspected.   Pt is aware of results and voiced her understanding.  OV scheduled for 08/30/2019 to discuss results further.  Nothing further is needed at this time.

## 2019-08-15 ENCOUNTER — Ambulatory Visit (INDEPENDENT_AMBULATORY_CARE_PROVIDER_SITE_OTHER): Payer: Medicare HMO | Admitting: Psychology

## 2019-08-15 DIAGNOSIS — F4323 Adjustment disorder with mixed anxiety and depressed mood: Secondary | ICD-10-CM

## 2019-08-16 ENCOUNTER — Other Ambulatory Visit: Payer: Medicare HMO

## 2019-08-17 ENCOUNTER — Ambulatory Visit
Admission: RE | Admit: 2019-08-17 | Discharge: 2019-08-17 | Disposition: A | Discharge status: Home / Self Care | Payer: Medicare HMO | Source: Ambulatory Visit | Attending: Vascular Surgery | Admitting: Vascular Surgery

## 2019-08-17 DIAGNOSIS — I7102 Dissection of abdominal aorta: Secondary | ICD-10-CM | POA: Diagnosis not present

## 2019-08-17 DIAGNOSIS — I7101 Dissection of thoracic aorta: Secondary | ICD-10-CM

## 2019-08-17 DIAGNOSIS — I71019 Dissection of thoracic aorta, unspecified: Secondary | ICD-10-CM

## 2019-08-17 MED ORDER — IOPAMIDOL (ISOVUE-370) INJECTION 76%
75.0000 mL | Freq: Once | INTRAVENOUS | Status: AC | PRN
  Administered 2019-08-17: 75 mL via INTRAVENOUS

## 2019-08-26 IMAGING — DX DG CHEST 2V
2 series · 2 of 2 positions shown · non-contrast
Comparison: 08/07/2016

CLINICAL DATA: Cough

EXAM:
CHEST  2 VIEW

[chest pa]
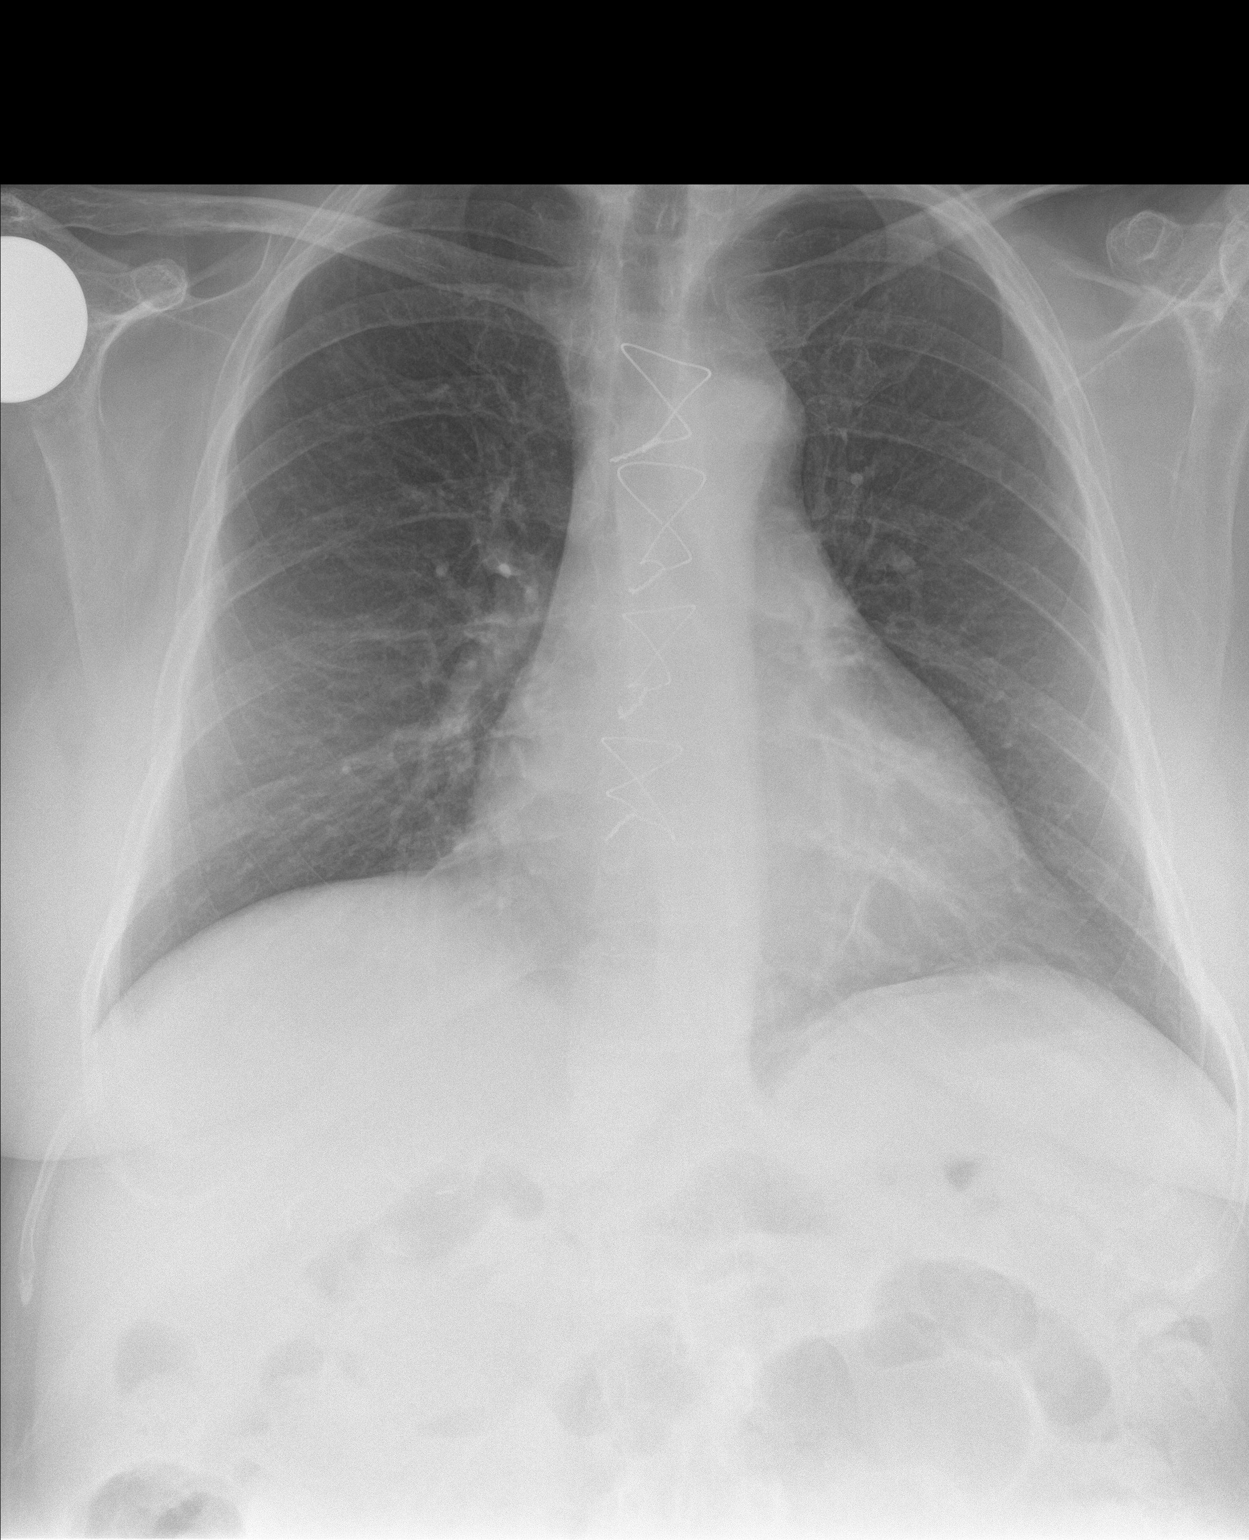

[chest lat]
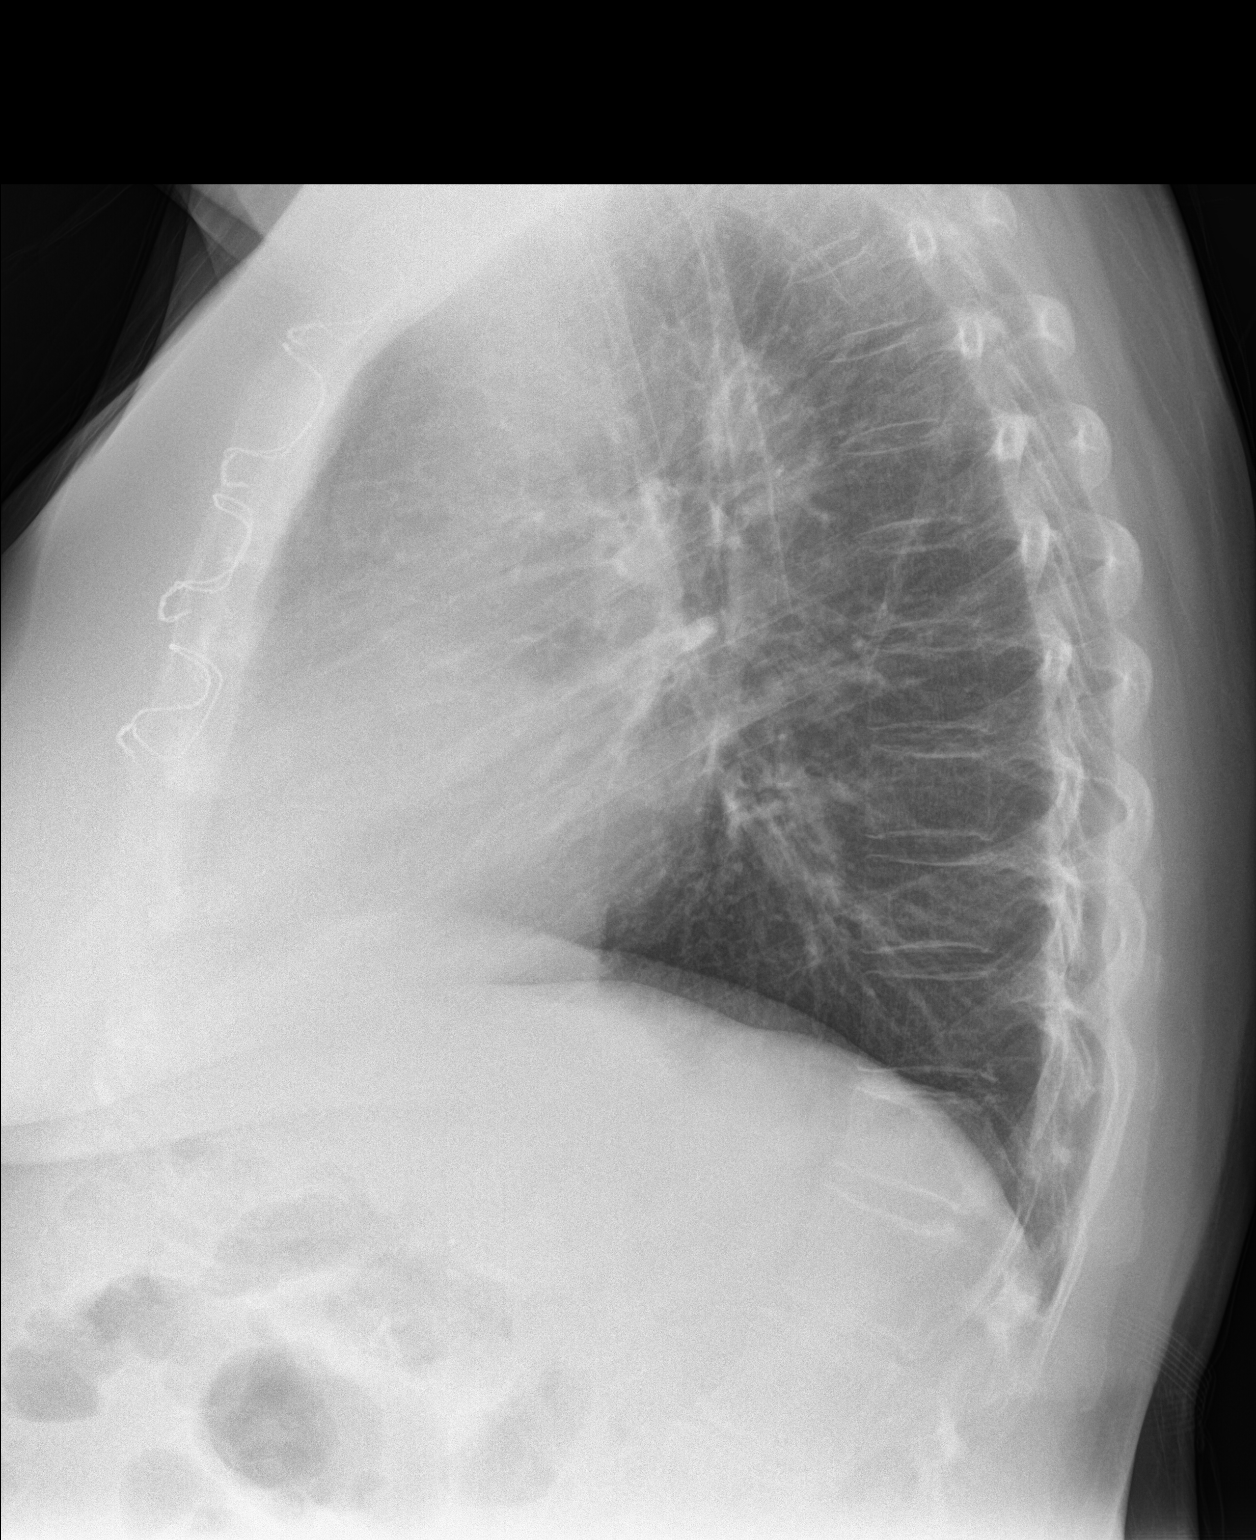

[2 of 2 positions shown; findings below may reference images not displayed]

FINDINGS: Cardiac shadow is stable. Postsurgical changes are again seen. The
lungs are well aerated bilaterally. No focal infiltrate or sizable
effusion is noted. Postsurgical changes in the right shoulder are
seen. No acute bony abnormality is noted.
IMPRESSION: No acute abnormality noted.

## 2019-08-28 ENCOUNTER — Encounter: Payer: Self-pay | Admitting: Vascular Surgery

## 2019-08-28 ENCOUNTER — Ambulatory Visit (INDEPENDENT_AMBULATORY_CARE_PROVIDER_SITE_OTHER): Payer: Medicare HMO | Admitting: Vascular Surgery

## 2019-08-28 ENCOUNTER — Other Ambulatory Visit: Payer: Self-pay

## 2019-08-28 VITALS — BP 136/67

## 2019-08-28 DIAGNOSIS — I71019 Dissection of thoracic aorta, unspecified: Secondary | ICD-10-CM

## 2019-08-28 DIAGNOSIS — I7101 Dissection of thoracic aorta: Secondary | ICD-10-CM | POA: Diagnosis not present

## 2019-08-28 NOTE — Progress Notes (Signed)
Virtual Visit via Telephone Note    I connected with Rachel Torres on 08/28/2019 by telephone and verified that I was speaking with the correct person using two identifiers. Patient was located at home and accompanied by herself. I am located at [facility].   The limitations of evaluation and management by telemedicine and the availability of in person appointments have been previously discussed with the patient and are documented in the patients chart. The patient expressed understanding and consented to proceed.  PCP: [name], MD   Chief Complaint: Follow-up known thoracic aortic dissection  History of Present Illness: Rachel Torres is a 63 y.o. female with with a complicated past history.  In 2011 she underwent repair of an ascending arch aneurysm and coronary bypass x1 at [facility].  She had presented with no flow to her left arm and right leg.  She had good resolution with chronic dissection.  Her last visit with her was with CT scan in 2018.  This does show distal thoracic dissection extending into her left common iliac artery.  She had a repeat CT scan on 08/17/2019 and I am discussing this with her by telephone.  She does report that she was hospitalized with upper respiratory infection around February of this year and continues to have some shortness of breath.  No evidence of peripheral ischemia.  Past Medical History:  Diagnosis Date  . AAA (abdominal aortic aneurysm) ([place])    a. Chronic w/o evidence of aneurysmal dil on CTA 01/2016.  Marland Kitchen Allergy   . Anemia   . Anxiety   . CAD (coronary artery disease)    a. 08/2010 s/p CABG x 1 (VG->RCA) @ time of Ao dissection repair; b. 01/2016 Lexiscan MV: mid antsept/apical defect w/ ? peri-infarct ischemia-->likely attenuation-->Med Rx.  . Chronic combined systolic (congestive) and diastolic (congestive) heart failure ([place])    a. 2012 EF 30-35%; b. 04/2015 EF 40-45%; c. 01/2016 Echo: Ef 55-65%, nl AoV  bioprosthesis, nl RV, nl PASP.  Marland Kitchen Depression   . Gallbladder sludge   . GERD (gastroesophageal reflux disease)   . History of Bicuspid Aortic Valve    a. 08/2010 s/p AVR @ time of Ao dissection repair; b. 01/2016 Echo: Ef 55-65%, nl AoV bioprosthesis, nl RV, nl PASP.  Marland Kitchen Hx of repair of dissecting thoracic aortic aneurysm, Stanford type A    a. 08/2010 s/p repair with AVR and VG->RCA; b. 01/2016 CTA: stable appearance of Asc Thoracic Aortic graft. Opacification of flase lumen of chronic abd Ao dissection w/ retrograde flow through lumbar arteries. No aneurysmal dil of flase lumen.  . Hyperlipidemia   . Hypertensive heart disease   . IBS (irritable bowel syndrome)   . Internal hemorrhoids   . Marfan syndrome    pt denies  . Migraine   . Obstructive sleep apnea   . Osteoporosis   . Pneumonia   . RA (rheumatoid arthritis) ([place])    a. Followed by Dr. [name]    Past Surgical History:  Procedure Laterality Date  . ABDOMINAL AORTIC ANEURYSM REPAIR    . CARDIAC CATHETERIZATION  2001  . [place]  . CHOLECYSTECTOMY    . COLONOSCOPY    . FRACTURE SURGERY Right    shoulder replacement  . HEMORRHOID BANDING    . ORIF DISTAL RADIUS FRACTURE Left   . UPPER GASTROINTESTINAL ENDOSCOPY      Current Meds  Medication Sig  . albuterol (PROVENTIL HFA;VENTOLIN  HFA) 108 (90 Base) MCG/ACT inhaler Inhale 1-2 puffs into the lungs every 6 (six) hours as needed for wheezing or shortness of breath.  . ALPRAZolam (XANAX) 0.5 MG tablet Take 1 tablet (0.5 mg total) by mouth daily as needed for anxiety.  Marland Kitchen aspirin EC 81 MG tablet Take 81 mg by mouth at bedtime.  Marland Kitchen atorvastatin (LIPITOR) 20 MG tablet TAKE 1 TABLET EVERY DAY  . carvedilol (COREG) 6.25 MG tablet TAKE 1 TABLET TWICE DAILY WITH MEALS  . Cholecalciferol (VITAMIN D3) 5000 units CAPS Take 1 capsule by mouth daily.  . cyclobenzaprine (FLEXERIL) 10 MG tablet Take 0.5-1 tablets (5-10 mg total) by mouth 3 (three) times daily as  needed. For headache  . furosemide (LASIX) 20 MG tablet TAKE 1 TABLET (20 MG TOTAL) BY MOUTH DAILY.  Marland Kitchen Krill Oil (OMEGA-3) 500 MG CAPS Take 1 capsule by mouth daily.  Marland Kitchen lisinopril (ZESTRIL) 5 MG tablet TAKE 1/2 TABLET EVERY DAY  . magnesium gluconate (MAGONATE) 500 MG tablet Take 500 mg by mouth at bedtime.  . Misc Natural Products (TART CHERRY ADVANCED) CAPS Take 1 capsule by mouth daily.  . naproxen sodium (ANAPROX) 220 MG tablet Take 440 mg by mouth 2 (two) times daily as needed (for headaches/pain).  . nitroGLYCERIN (NITROSTAT) 0.4 MG SL tablet Place 1 tablet (0.4 mg total) under the tongue every 5 (five) minutes as needed for chest pain.  Marland Kitchen omeprazole (PRILOSEC) 40 MG capsule TAKE 1 CAPSULE EVERY DAY  . potassium chloride (K-DUR) 10 MEQ tablet TAKE 1 TABLET TWICE DAILY AS NEEDED  . promethazine (PHENERGAN) 25 MG tablet Take 0.5 tablets (12.5 mg total) by mouth every 8 (eight) hours as needed for nausea.  Marland Kitchen venlafaxine XR (EFFEXOR-XR) 150 MG 24 hr capsule TAKE 1 CAPSULE EVERY DAY WITH BREAKFAST    12 system ROS was negative unless otherwise noted in HPI   Observations/Objective: CT scan reveals stable descending thoracic dissection with flap into her left common iliac artery.  Her infrarenal aorta is slightly larger than the study from 2018.  Today is 3.2 cm and was 3.0 cm.  There is does not appear to be any new changes with her a sending arch repair.  Assessment and Plan: Stable chronic thoracic dissection.  I recommend that we see her again in 2 years with recurrent CT chest abdomen and pelvis  Follow Up Instructions:   Follow up 2 years   I discussed the assessment and treatment plan with the patient. The patient was provided an opportunity to ask questions and all were answered. The patient agreed with the plan and demonstrated an understanding of the instructions.   The patient was advised to call back or seek an in-person evaluation if the symptoms worsen or if the condition  fails to improve as anticipated.  I spent 5-10 minutes with the patient via telephone encounter.   Annamary Rummage [facility] [place] Office: [phone]  08/28/2019, 11:25 AM

## 2019-08-29 ENCOUNTER — Ambulatory Visit (INDEPENDENT_AMBULATORY_CARE_PROVIDER_SITE_OTHER): Payer: Medicare HMO | Admitting: Psychology

## 2019-08-29 DIAGNOSIS — F4323 Adjustment disorder with mixed anxiety and depressed mood: Secondary | ICD-10-CM

## 2019-08-30 ENCOUNTER — Ambulatory Visit (INDEPENDENT_AMBULATORY_CARE_PROVIDER_SITE_OTHER): Payer: Medicare HMO | Admitting: Internal Medicine

## 2019-08-30 ENCOUNTER — Other Ambulatory Visit: Payer: Self-pay

## 2019-08-30 ENCOUNTER — Encounter: Payer: Self-pay | Admitting: Internal Medicine

## 2019-08-30 VITALS — BP 126/82 | HR 82 | Temp 97.2°F | Ht 65.0 in | Wt 214.0 lb

## 2019-08-30 DIAGNOSIS — J4520 Mild intermittent asthma, uncomplicated: Secondary | ICD-10-CM | POA: Diagnosis not present

## 2019-08-30 NOTE — Patient Instructions (Addendum)
Please follow up with PFT's  Check ONO for Nocturnal hypoxia  Recommend weight loss   Ct chest reviewed with Patient

## 2019-08-30 NOTE — Progress Notes (Signed)
Name: Rachel Torres MRN: [id number] DOB: 09/27/1956     CONSULTATION DATE: 08/30/2019  REFERRING MD : Idolina Primer    CARDIAC HISTORY - history of CAD s/p 1 vessel CABG with SVG-RCA in 08/2010 coupled with type a ascending aortic dissection repair and bioprosthetic AVR at the same time with residual descending aortic dissection extending into the iliac artery followed by vascular surgery, patient reported SVT vs Afib in the 1990s s/p self reported ablation with details unclear, marfan syndrome, asthma, hypertensive heart disease, chronic combined CHF with subsequent normalization of her EF, migraine disorder, OSA not on CPAP, back pain, and anxiety    Nuclear stress test at [facility] in 08/2012 without ischemia and EF of 52%  echo from 04/2015 showed an EF of 40-45% which was improved from prior. - echo from 01/2016 showed normalization of her EF to [id number], LV diastolic function was normal, septal motion c/w post-operative state, normal functioning bioprosthetic arotic valve without stenosis, PASP normal. She was admitted in 2017 with chest pain and ruled out. She underwent Lexiscan Myoview that was low risk with probably attenuation artifact. She continued to note symptoms, though they seemed to be associated with food intake. GI work up showed a small hiatal hernia and several small polyps. Symptoms improved with GI cocktail. She was seen in the ED in 01/2018 and told she had Afib. She was prescribed Coumadin through the ED, though she did not start this. Upon her cardiologist reviewing the EKGs, she was noted to have sinus rhythm with PACs. There was no evidence of Afib.  01/2018 Echo at that time showed an EF of 50-55%, anteroseptal HK, Gr2DD, bioprosthetic aortic valve was noted, mild MR, mildly dilated left atrium, RVSF normal, PASP normal.  She was last seen in the office in 06/2018, noting increased stress at home and weight gain in the setting of poor diet. Since she was last seen, she has been seen in  the ED and admitted several times for asthma exacerbations.    She continues to have stable exertional shortness of breath that she attributes to her obesity and physical deconditioning.    She denies any chest pain, palpitations, presyncope, or syncope.  She does note some dizziness if she bends over.  She has not followed up with vascular surgery and needs to make an appointment with them.  Her blood pressure remains well controlled in the 1 teens to [id number] systolic.  She denies any lower extremity swelling, abdominal distention, orthopnea, PND, early satiety.  She states her weight had peaked at 216 pounds approximately 1 month ago though she has subsequently discontinued almost all carbs outside of this past weekend when she visited with her son and had quite a few desserts.  No falls, BRBPR, or melena.  She continues to have significant amount of stressors at home with the help of her husband and mother.   PULM HISTORY In February 2020 patient was hospitalized for pneumonia and was diagnosed with influenza pneumonia She was diagnosed with respiratory failure placed on oxygen therapy  Since her discharge from the hospital she had increased shortness of breath and dyspnea exertion no chest pain no palpitations Has intermittent productive cough but no wheezing She was prescribed nebulizers and has had helped tremendously but inhalers did not help She has had 4-5 pneumonias in the last 4 to 5 years Patient states she was diagnosed with asthma in her 63s however there are no triggers no history of eczema Patient works with perfumes and candles and  scents and there is no triggers at this time  Patient is a non-smoker however had secondhand smoke exposure for approximately 10 years  She has a family history of asthma Patient has been diagnosed with sleep apnea for the last 6 years however is noncompliant for the last 4 years  CHIEF COMPLAINT:  Follow up reactive airways disease +OSA    HISTORY OF PRESENT ILLNESS: SOB resolved Second hand smoke exposure 15 years  CTA of chest reviewed with patient 08/17/19 Mild ILD LLL base Maybe from previous pneumonia Otherwise WNL, no masses  Sleep study results discussed with patient Patient had very mild borderline with AHI of 4.5 Patient did have dips in oxygen saturations throughout the sleep study Patient will need overnight pulse oximetry for further assessment  Patient has no other symptoms at this time   No evidence of heart failure at this time No evidence or signs of infection at this time No respiratory distress No fevers, chills, nausea, vomiting, diarrhea No evidence of lower extremity edema No evidence hemoptysis        PAST MEDICAL HISTORY :   has a past medical history of AAA (abdominal aortic aneurysm) ([place]), Allergy, Anemia, Anxiety, CAD (coronary artery disease), Chronic combined systolic (congestive) and diastolic (congestive) heart failure ([place]), Depression, Gallbladder sludge, GERD (gastroesophageal reflux disease), History of Bicuspid Aortic Valve, repair of dissecting thoracic aortic aneurysm, Stanford type A, Hyperlipidemia, Hypertensive heart disease, IBS (irritable bowel syndrome), Internal hemorrhoids, Marfan syndrome, Migraine, Obstructive sleep apnea, Osteoporosis, Pneumonia, and RA (rheumatoid arthritis) ([place]).  has a past surgical history that includes Cesarean section ([place]); Cardiac catheterization (2001); Fracture surgery (Right); ORIF distal radius fracture (Left); Abdominal aortic aneurysm repair; Cholecystectomy; Colonoscopy; Upper gastrointestinal endoscopy; and Hemorrhoid banding. Prior to Admission medications   Medication Sig Start Date End Date Taking? Authorizing Provider  albuterol (PROVENTIL HFA;VENTOLIN HFA) 108 (90 Base) MCG/ACT inhaler Inhale 1-2 puffs into the lungs every 6 (six) hours as needed for wheezing or shortness of breath. 01/12/19  Yes Pleas Koch, NP   ALPRAZolam Duanne Moron) 0.5 MG tablet Take 1 tablet (0.5 mg total) by mouth daily as needed for anxiety. 06/20/19  Yes Tower, Wynelle Fanny, MD  aspirin EC 81 MG tablet Take 81 mg by mouth at bedtime.   Yes [provider]  atorvastatin (LIPITOR) 20 MG tablet TAKE 1 TABLET EVERY DAY 04/04/19  Yes Gollan, Kathlene November, MD  carvedilol (COREG) 6.25 MG tablet TAKE 1 TABLET TWICE DAILY WITH MEALS 07/09/19  Yes Gollan, Kathlene November, MD  Cholecalciferol (VITAMIN D3) 5000 units CAPS Take 1 capsule by mouth daily.   Yes [provider]  cyclobenzaprine (FLEXERIL) 10 MG tablet Take 0.5-1 tablets (5-10 mg total) by mouth 3 (three) times daily as needed. For headache 06/20/19  Yes Tower, Roque Lias A, MD  furosemide (LASIX) 20 MG tablet TAKE 1 TABLET (20 MG TOTAL) BY MOUTH DAILY. 05/24/19  Yes Gollan, Kathlene November, MD  Javier Docker Oil (OMEGA-3) 500 MG CAPS Take 1 capsule by mouth daily.   Yes [provider]  lisinopril (PRINIVIL,ZESTRIL) 5 MG tablet TAKE 1/2 TABLET (2.5 MG TOTAL) BY MOUTH DAILY. 02/20/19  Yes Gollan, Kathlene November, MD  magnesium gluconate (MAGONATE) 500 MG tablet Take 500 mg by mouth at bedtime.   Yes [provider]  Misc Natural Products (TART CHERRY ADVANCED) CAPS Take 1 capsule by mouth daily.   Yes [provider]  naproxen sodium (ANAPROX) 220 MG tablet Take 440 mg by mouth 2 (two) times daily  as needed (for headaches/pain).   Yes [provider]  nitroGLYCERIN (NITROSTAT) 0.4 MG SL tablet Place 1 tablet (0.4 mg total) under the tongue every 5 (five) minutes as needed for chest pain. 04/26/17  Yes Minna Merritts, MD  omeprazole (PRILOSEC) 40 MG capsule TAKE 1 CAPSULE EVERY DAY 04/05/19  Yes Tower, Wynelle Fanny, MD  potassium chloride (K-DUR) 10 MEQ tablet TAKE 1 TABLET TWICE DAILY AS NEEDED 07/09/19  Yes Gollan, Kathlene November, MD  promethazine (PHENERGAN) 25 MG tablet Take 0.5 tablets (12.5 mg total) by mouth every 8 (eight) hours as needed for nausea. 06/20/19  Yes Tower, Wynelle Fanny, MD   venlafaxine XR (EFFEXOR-XR) 150 MG 24 hr capsule TAKE 1 CAPSULE EVERY DAY WITH BREAKFAST 04/05/19  Yes Tower, Wynelle Fanny, MD   Allergies  Allergen Reactions  . Fosamax [Alendronate Sodium]     GI upset   . Ginzing [Ginseng] Other (See Comments)    Hyper and jitery   . Hydrocod Polst-Cpm Polst Er Itching  . Hydrocodone-Acetaminophen Itching  . Imitrex [Sumatriptan]     Contraindicated w/ heart condition  . Oxycodone Itching  . Peanut-Containing Drug Products Other (See Comments)    Reaction:  Migraines   . Tramadol Other (See Comments)    itching  . Hydrocodone-Guaifenesin Itching  . Prednisone Rash  . Tape Itching   SOCIAL HISTORY:  reports that she quit smoking about 40 years ago. Her smoking use included cigarettes. She has a 1.13 pack-year smoking history. She has never used smokeless tobacco. She reports current alcohol use. She reports that she does not use drugs.    Review of Systems:  Gen:  Denies  fever, sweats, chills weight loss  HEENT: Denies blurred vision, double vision, ear pain, eye pain, hearing loss, nose bleeds, sore throat Cardiac:  No dizziness, chest pain or heaviness, chest tightness,edema, No JVD Resp:   No cough, -sputum production, -shortness of breath,-wheezing, -hemoptysis,  Gi: Denies swallowing difficulty, stomach pain, nausea or vomiting, diarrhea, constipation, bowel incontinence Gu:  Denies bladder incontinence, burning urine Ext:   Denies Joint pain, stiffness or swelling Skin: Denies  skin rash, easy bruising or bleeding or hives Endoc:  Denies polyuria, polydipsia , polyphagia or weight change Psych:   Denies depression, insomnia or hallucinations  Other:  All other systems negative    BP 126/82   Pulse 82   Temp (!) 97.2 F (36.2 C) (Temporal)   Ht 5\' 5"  (1.651 m)   Wt 214 lb (97.1 kg)   SpO2 96%   BMI 35.61 kg/m     Physical Examination:   GENERAL:NAD, no fevers, chills, no weakness no fatigue HEAD: Normocephalic, atraumatic.   EYES: PERLA, EOMI No scleral icterus.  MOUTH: Moist mucosal membrane.  EAR, NOSE, THROAT: Clear without exudates. No external lesions.  NECK: Supple. No thyromegaly.  No JVD.  PULMONARY: CTA B/L no wheezing, rhonchi, crackles CARDIOVASCULAR: S1 and S2. Regular rate and rhythm. No murmurs GASTROINTESTINAL: Soft, nontender, nondistended. Positive bowel sounds.  MUSCULOSKELETAL: No swelling, clubbing, or edema.  NEUROLOGIC: No gross focal neurological deficits. 5/5 strength all extremities SKIN: No ulceration, lesions, rashes, or cyanosis.  PSYCHIATRIC: Insight, judgment intact. -depression -anxiety ALL OTHER ROS ARE NEGATIVE           MEDICATIONS: I have reviewed all medications and confirmed regimen as documented     IMAGING   Chest x-ray February 2019 reviewed by me today No active disease no pneumonia no effusions no masses Normal chest x-ray findings  The CT chest  was Independently Reviewed By Me Today Could be from previous pneumonia No other findings otherwise very healthy looking lungs CT chest MILD LLL opacity very vague   ASSESSMENT AND PLAN SYNOPSIS  Previous history of influenza pneumonia with mild vague interstitial lung findings in the left lower lobe which does not seem to be very significant Patient does not have any respiratory issues at this time  Patient has a diagnosis of borderline sleep apnea with an AHI of 4.5 At this time weight loss is strongly recommended There are episodes of hypoxia during her sleep study but will need overnight pulse oximetry for definitive length of time and need for therapy We will then discuss with patient whether or not to start auto CPAP therapy   Patient will still need pulmonary function testing to assess for underlying obstructive airways disease PFTs pending    COVID-19 EDUCATION: The signs and symptoms of COVID-19 were discussed with the patient and how to seek care for testing.  The importance of social  distancing was discussed today. Hand Washing Techniques and avoid touching face was advised.  MEDICATION ADJUSTMENTS/LABS AND TESTS ORDERED: Pulmonary function testing Follow-up overnight pulse oximetry   CURRENT MEDICATIONS REVIEWED AT LENGTH WITH PATIENT TODAY   Patient satisfied with Plan of action and management. All questions answered  Follow up in 6   months   Cathi Hazan Patricia Pesa, M.D.  [facility]  Medical Director Center Director [facility] Cardio-Pulmonary Department

## 2019-08-31 DIAGNOSIS — J4520 Mild intermittent asthma, uncomplicated: Secondary | ICD-10-CM | POA: Diagnosis not present

## 2019-08-31 DIAGNOSIS — J4541 Moderate persistent asthma with (acute) exacerbation: Secondary | ICD-10-CM | POA: Diagnosis not present

## 2019-08-31 NOTE — Addendum Note (Signed)
Addended by: Maryanna Shape A on: 08/31/2019 08:41 AM   Modules accepted: Orders

## 2019-09-03 ENCOUNTER — Encounter: Payer: Self-pay | Admitting: Family Medicine

## 2019-09-03 ENCOUNTER — Other Ambulatory Visit: Payer: Self-pay | Admitting: Family Medicine

## 2019-09-03 DIAGNOSIS — R0902 Hypoxemia: Secondary | ICD-10-CM | POA: Diagnosis not present

## 2019-09-03 DIAGNOSIS — J449 Chronic obstructive pulmonary disease, unspecified: Secondary | ICD-10-CM | POA: Diagnosis not present

## 2019-09-04 MED ORDER — AMOXICILLIN 500 MG PO CAPS: 2000.0000 mg | ORAL_CAPSULE | Freq: Once | ORAL | 0 refills | Status: AC

## 2019-09-06 NOTE — Progress Notes (Signed)
Cardiology Office Note  Date:  09/10/2019   ID:  Rachel Torres, DOB 04-03-1956, MRN [id number]  PCP:  [name], MD   Chief Complaint  Patient presents with  . Other    Follow up post ECHO. Patient c/o increased SOB. Meds reviewed verbally with patient.     HPI:  Rachel Torres is a very pleasant 63 year-old woman who suffered an aorta type A dissection in September 2011   rushed to [facility] and underwent aortic valve replacement,  bypass of the right coronary artery with venous donor site from her right thigh,  aortic grafting for a extensive dissection of the  ascending aorta,  residual descending aortic dissection extending into the iliac artery,  chronic back pain,  symptoms of shortness of breath,  abdominal pain,  hypertension  She reports pathology of her aortic valve showed bicuspid valve Chest pain episodes resolved by GI cocktail She presents today for follow-up of her aortic valve replacement and CABG.  Recent events discussed with her Feb 2020: URI Now has inhaler Flu A positive rhinovirus  Still with Sweating, while sitting More SOB with exertion, Weight up  Echo 07/2019, discussed EF 45 to 50%  ABD swollen Drinks lots of water  Weight up eats out frequently with husband Denies any chest pain  HBA1C 6.0 Lab Results  Component Value Date   CHOL 137 06/07/2019   HDL 47.40 06/07/2019   LDLCALC 68 06/07/2019   TRIG 108.0 06/07/2019   EKG personally reviewed by myself on todays visit Shows nsr nonspecific ST precordial leads, Twave ABN   Other past medical history reviewed Previous episode of Chest pain December 11, 2017, Was driving, pulled over, called EMT Resolved 30 min later, 8/10 in intensity EKG was ok Typically takes GI cocktail when she has the symptoms.   Reports that she had significant stress with dinner party planned the next day.   takes GI cocktail for severe symptoms Once a month or so has severe reflux symptoms    seen by vascular surgery , Dr. Donnetta Hutching, seen 01/2017  recommendation was for f/u in 2 years with CT scan of her chest abdomen and pelvis.  Echo 01/2016, normal EF >55%  Previous  30 day monitor ordered for palpitations,  APCs and PVCs  diagnosed with obstructive sleep apnea. She has a CPAP machine but she is not using it.  Previous evaluations in the emergency room for chest pain. Cardiac enzymes were negative , EKG unchanged .She had a CT scan dated 06/20/2013. She was transferred to [facility] given her complicated anatomy. images were evaluated by vascular surgery it was determined there have been no significant progression of her disease  Previous evaluation at [facility] for shortness of breath and chest discomfort. She had workup for cardiac issues including echocardiogram and PET stress, CTA of the chest showing stable descending aorta repair with type B. aortic dissection with opacification of the false lumen at the level of the renal arteries via a lumbar artery.  Echocardiogram showed ejection fraction had improved to 45-50%, mild MR, TR (ejection fraction improved from 30-35% in January 2012)  Previous H/o abdominal pain, further workup showing biliary stones and sludge. s/p  laparoscopic cholecystectomy at [facility].  She takes Lasix for urinary retention.    PMH:   has a past medical history of AAA (abdominal aortic aneurysm) ([place]), Allergy, Anemia, Anxiety, CAD (coronary artery disease), Chronic combined systolic (congestive) and diastolic (congestive) heart failure ([place]), Depression, Gallbladder sludge, GERD (gastroesophageal reflux disease),  History of Bicuspid Aortic Valve, repair of dissecting thoracic aortic aneurysm, Stanford type A, Hyperlipidemia, Hypertensive heart disease, IBS (irritable bowel syndrome), Internal hemorrhoids, Marfan syndrome, Migraine, Obstructive sleep apnea, Osteoporosis, Pneumonia, and RA (rheumatoid arthritis) ([place]).  PSH:    Past Surgical History:  Procedure  Laterality Date  . ABDOMINAL AORTIC ANEURYSM REPAIR    . CARDIAC CATHETERIZATION  2001  . [place]  . CHOLECYSTECTOMY    . COLONOSCOPY    . FRACTURE SURGERY Right    shoulder replacement  . HEMORRHOID BANDING    . ORIF DISTAL RADIUS FRACTURE Left   . UPPER GASTROINTESTINAL ENDOSCOPY      Current Outpatient Medications  Medication Sig Dispense Refill  . albuterol (PROVENTIL HFA;VENTOLIN HFA) 108 (90 Base) MCG/ACT inhaler Inhale 1-2 puffs into the lungs every 6 (six) hours as needed for wheezing or shortness of breath. 1 Inhaler 0  . ALPRAZolam (XANAX) 0.5 MG tablet Take 1 tablet (0.5 mg total) by mouth daily as needed for anxiety. 30 tablet 0  . aspirin EC 81 MG tablet Take 81 mg by mouth at bedtime.    Marland Kitchen atorvastatin (LIPITOR) 20 MG tablet TAKE 1 TABLET EVERY DAY 90 tablet 3  . carvedilol (COREG) 6.25 MG tablet TAKE 1 TABLET TWICE DAILY WITH MEALS 180 tablet 0  . Cholecalciferol (VITAMIN D3) 5000 units CAPS Take 1 capsule by mouth daily.    . cyclobenzaprine (FLEXERIL) 10 MG tablet Take 0.5-1 tablets (5-10 mg total) by mouth 3 (three) times daily as needed. For headache 90 tablet 1  . furosemide (LASIX) 20 MG tablet Take 1 tablet (20 mg total) by mouth once daily, then take additional 1 tablet (20 mg total) in the afternoon as needed for swelling, SOB or weight gain. 180 tablet 2  . Krill Oil (OMEGA-3) 500 MG CAPS Take 1 capsule by mouth daily.    Marland Kitchen lisinopril (ZESTRIL) 5 MG tablet TAKE 1/2 TABLET EVERY DAY 45 tablet 2  . magnesium gluconate (MAGONATE) 500 MG tablet Take 500 mg by mouth at bedtime.    . Misc Natural Products (TART CHERRY ADVANCED) CAPS Take 1 capsule by mouth daily.    . naproxen sodium (ANAPROX) 220 MG tablet Take 440 mg by mouth 2 (two) times daily as needed (for headaches/pain).    . nitroGLYCERIN (NITROSTAT) 0.4 MG SL tablet Place 1 tablet (0.4 mg total) under the tongue every 5 (five) minutes as needed for chest pain. 25 tablet 3  . omeprazole  (PRILOSEC) 40 MG capsule TAKE 1 CAPSULE EVERY DAY 90 capsule 1  . potassium chloride (K-DUR) 10 MEQ tablet Take 1 tablet (10 mEq total) by mouth 2 (two) times daily as needed. 180 tablet 3  . promethazine (PHENERGAN) 25 MG tablet Take 0.5 tablets (12.5 mg total) by mouth every 8 (eight) hours as needed for nausea. 90 tablet 1  . venlafaxine XR (EFFEXOR-XR) 150 MG 24 hr capsule TAKE 1 CAPSULE EVERY DAY WITH BREAKFAST 90 capsule 1   No current facility-administered medications for this visit.      Allergies:   Fosamax [alendronate sodium], Ginzing [ginseng], Hydrocod polst-cpm polst er, Hydrocodone-acetaminophen, Imitrex [sumatriptan], Oxycodone, Peanut-containing drug products, Tramadol, Hydrocodone-guaifenesin, Prednisone, and Tape   Social History:  The patient  reports that she quit smoking about 40 years ago. Her smoking use included cigarettes. She has a 1.13 pack-year smoking history. She has never used smokeless tobacco. She reports current alcohol use. She reports that she does not use drugs.  Family History:   family history includes Aneurysm in her paternal grandmother; Arthritis in her paternal grandfather, paternal grandmother, sister, and sister; Asthma in her brother, mother, sister, and sister; Colon cancer in her maternal grandmother; Depression in her mother; Diabetes in her paternal grandfather, paternal grandmother, and sister; Heart disease in her sister; Hypertension in her mother; Irritable bowel syndrome in her mother; Lung cancer in her maternal grandfather; Lymphoma in her maternal grandmother; Melanoma in her maternal grandfather; Migraines in her brother, maternal grandmother, sister, and sister; Nephrolithiasis in her sister; Osteoporosis in her mother and sister; Other in her brother; Other (age of onset: 57) in her father; Stroke in her maternal grandmother and mother.    Review of Systems: Review of Systems  Constitutional: Positive for diaphoresis.  Respiratory:  Positive for shortness of breath.   Cardiovascular: Negative.   Gastrointestinal: Negative.   Musculoskeletal: Negative.   Neurological: Negative.   Psychiatric/Behavioral: Negative.   All other systems reviewed and are negative.    PHYSICAL EXAM: VS:  BP 130/80 (BP Location: Left Arm, Patient Position: Sitting, Cuff Size: Normal)   Pulse 67   Ht 5\' 5"  (1.651 m)   Wt 213 lb 8 oz (96.8 kg)   BMI 35.53 kg/m  , BMI Body mass index is 35.53 kg/m. Constitutional:  oriented to person, place, and time. No distress. obese HENT:  Head: Grossly normal Eyes:  no discharge. No scleral icterus.  Neck: No JVD, no carotid bruits  Cardiovascular: Regular rate and rhythm, no murmurs appreciated Pulmonary/Chest: Clear to auscultation bilaterally, no wheezes or rails Abdominal: Soft.  no distension.  no tenderness.  Musculoskeletal: Normal range of motion Neurological:  normal muscle tone. Coordination normal. No atrophy Skin: Skin warm and dry Psychiatric: normal affect, pleasant   Recent Labs: 06/07/2019: ALT 18; BUN 19; Creatinine, Ser 0.88; Hemoglobin 12.8; Platelets 248.0; Potassium 4.1; Sodium 138; TSH 0.76    Lipid Panel Lab Results  Component Value Date   CHOL 137 06/07/2019   HDL 47.40 06/07/2019   LDLCALC 68 06/07/2019   TRIG 108.0 06/07/2019      Wt Readings from Last 3 Encounters:  09/10/19 213 lb 8 oz (96.8 kg)  08/30/19 214 lb (97.1 kg)  07/17/19 208 lb (94.3 kg)      ASSESSMENT AND PLAN:  Pure hypercholesterolemia Cholesterol is at goal on the current lipid regimen. No changes to the medications were made.  Essential hypertension - Plan: EKG 12-Lead Blood pressure is well controlled on today's visit. No changes made to the medications.  CORONARY ARTERY BYPASS GRAFT, - Chronic stable angina Cardiac rehab  Dissection of thoracoabdominal aorta (HCC) Followed by vascular surgeryin [place], Dr. Donnetta Hutching   Stable on recent eval  H/O dissecting abdominal  aortic aneurysm repair - Plan: EKG 12-Lead  MRI 2020  Followed by vascular in [place]  Chronic diastolic CHF Cardiac rehab, Extra lasix Recent echo EF 45% Weight up, high fluid intake   Total encounter time more than 25 minutes  Greater than 50% was spent in counseling and coordination of care with the patient   Disposition:   F/U  6 months   Orders Placed This Encounter  Procedures  . AMB referral to cardiac rehabilitation  . EKG 12-Lead     Signed, Esmond Plants, M.D., Ph.D. 09/10/2019  [place], [place]

## 2019-09-10 ENCOUNTER — Encounter: Payer: Self-pay | Admitting: Cardiovascular Disease

## 2019-09-10 ENCOUNTER — Ambulatory Visit (INDEPENDENT_AMBULATORY_CARE_PROVIDER_SITE_OTHER): Payer: Medicare HMO | Admitting: Cardiovascular Disease

## 2019-09-10 ENCOUNTER — Other Ambulatory Visit: Payer: Self-pay

## 2019-09-10 VITALS — BP 130/80 | HR 67 | Ht 65.0 in | Wt 213.5 lb

## 2019-09-10 DIAGNOSIS — I5042 Chronic combined systolic (congestive) and diastolic (congestive) heart failure: Secondary | ICD-10-CM

## 2019-09-10 DIAGNOSIS — I480 Paroxysmal atrial fibrillation: Secondary | ICD-10-CM

## 2019-09-10 DIAGNOSIS — I7103 Dissection of thoracoabdominal aorta: Secondary | ICD-10-CM | POA: Diagnosis not present

## 2019-09-10 DIAGNOSIS — Z952 Presence of prosthetic heart valve: Secondary | ICD-10-CM

## 2019-09-10 DIAGNOSIS — I2510 Atherosclerotic heart disease of native coronary artery without angina pectoris: Secondary | ICD-10-CM | POA: Diagnosis not present

## 2019-09-10 DIAGNOSIS — I10 Essential (primary) hypertension: Secondary | ICD-10-CM

## 2019-09-10 MED ORDER — FUROSEMIDE 20 MG PO TABS: ORAL_TABLET | ORAL | 2 refills | Status: DC

## 2019-09-10 MED ORDER — POTASSIUM CHLORIDE CRYS ER 10 MEQ PO TBCR: 10.0000 meq | EXTENDED_RELEASE_TABLET | Freq: Two times a day (BID) | ORAL | 3 refills | Status: DC | PRN

## 2019-09-10 NOTE — Patient Instructions (Addendum)
We will order cardiac rehab   Medication Instructions:  1- Increase Lasix. Take 1 tablet (20 mg total) by mouth once daily, then take additional 1 tablet (20 mg total) in the afternoon as needed for swelling, SOB or weight gain. 2- Take additional k cl when increasing Lasix.  If you need a refill on your cardiac medications before your next appointment, please call your pharmacy.    Lab work: No new labs needed   If you have labs (blood work) drawn today and your tests are completely normal, you will receive your results only by: Marland Kitchen MyChart Message (if you have MyChart) OR . A paper copy in the mail If you have any lab test that is abnormal or we need to change your treatment, we will call you to review the results.   Testing/Procedures: No new testing needed   Follow-Up: At [facility], you and your health needs are our priority.  As part of our continuing mission to provide you with exceptional heart care, we have created designated Provider Care Teams.  These Care Teams include your primary Cardiologist (physician) and Advanced Practice Providers (APPs -  Physician Assistants and Nurse Practitioners) who all work together to provide you with the care you need, when you need it.  . You will need a follow up appointment in 6 months .   Please call our office 2 months in advance to schedule this appointment.    . Providers on your designated Care Team:   . Murray Hodgkins, NP . Christell Faith, PA-C . Marrianne Mood, PA-C  Any Other Special Instructions Will Be Listed Below (If Applicable). 1- Ref to [facility]) [phone]  For educational health videos Log in to : [email or web address] Or : [email or web address], password : [email or web address]

## 2019-09-11 ENCOUNTER — Other Ambulatory Visit: Payer: Self-pay | Admitting: *Deleted

## 2019-09-11 DIAGNOSIS — I5032 Chronic diastolic (congestive) heart failure: Secondary | ICD-10-CM

## 2019-09-12 ENCOUNTER — Telehealth: Payer: Self-pay | Admitting: Internal Medicine

## 2019-09-12 ENCOUNTER — Ambulatory Visit (INDEPENDENT_AMBULATORY_CARE_PROVIDER_SITE_OTHER): Payer: Medicare HMO | Admitting: Psychology

## 2019-09-12 DIAGNOSIS — J449 Chronic obstructive pulmonary disease, unspecified: Secondary | ICD-10-CM

## 2019-09-12 DIAGNOSIS — F4323 Adjustment disorder with mixed anxiety and depressed mood: Secondary | ICD-10-CM | POA: Diagnosis not present

## 2019-09-12 NOTE — Telephone Encounter (Signed)
ONO reviewed by Dr. Mortimer Fries. Recommend 2L QHS. Left message to relay results.

## 2019-09-13 NOTE — Telephone Encounter (Signed)
Pt is aware of results and voiced her understanding.  Pt wished to proceed with oxygen. Order will be placed once I have received dx from MD.  DK please advise on diagnosis for oxygen.

## 2019-09-17 ENCOUNTER — Other Ambulatory Visit: Payer: Self-pay

## 2019-09-17 ENCOUNTER — Ambulatory Visit (INDEPENDENT_AMBULATORY_CARE_PROVIDER_SITE_OTHER): Payer: Medicare HMO

## 2019-09-17 ENCOUNTER — Ambulatory Visit: Payer: Medicare HMO | Admitting: Psychology

## 2019-09-17 DIAGNOSIS — Z23 Encounter for immunization: Secondary | ICD-10-CM

## 2019-09-20 ENCOUNTER — Telehealth: Payer: Self-pay | Admitting: Internal Medicine

## 2019-09-20 NOTE — Telephone Encounter (Signed)
[facility] is unable to provide 02 for patient due to patient having OSA and is being ordered nocturnal 02.  Insurance required a Titrated Sleep Study to qualify her for oxygen. In OV note states pt has OSA and is NOT using a CPAP.    Please advise.  Rhonda J Cobb

## 2019-09-20 NOTE — Telephone Encounter (Signed)
Called and spoke with patient and advised that 02 could not be ordered due to her having a diagnosis of OSA.   Per DK request pt has been scheduled a F/U to re-address issue. Pt is aware of the above and this appointment. O2 order canceled. Rhonda J Cobb

## 2019-09-20 NOTE — Telephone Encounter (Signed)
DK please advise?

## 2019-09-20 NOTE — Telephone Encounter (Signed)
COPD

## 2019-09-20 NOTE — Telephone Encounter (Signed)
Patient needs another OV to assess

## 2019-09-20 NOTE — Telephone Encounter (Signed)
Order has been placed.  Nothing further is  needed.  

## 2019-09-21 ENCOUNTER — Ambulatory Visit (INDEPENDENT_AMBULATORY_CARE_PROVIDER_SITE_OTHER): Payer: Medicare HMO | Admitting: Psychology

## 2019-09-21 DIAGNOSIS — F4323 Adjustment disorder with mixed anxiety and depressed mood: Secondary | ICD-10-CM | POA: Diagnosis not present

## 2019-09-26 ENCOUNTER — Ambulatory Visit (INDEPENDENT_AMBULATORY_CARE_PROVIDER_SITE_OTHER): Payer: Medicare HMO | Admitting: Psychology

## 2019-09-26 DIAGNOSIS — F4323 Adjustment disorder with mixed anxiety and depressed mood: Secondary | ICD-10-CM | POA: Diagnosis not present

## 2019-09-28 ENCOUNTER — Telehealth: Payer: Self-pay | Admitting: Internal Medicine

## 2019-09-28 ENCOUNTER — Other Ambulatory Visit: Payer: Self-pay

## 2019-09-28 NOTE — Telephone Encounter (Signed)
Pt is aware of date/time of covid test.   

## 2019-09-30 DIAGNOSIS — J4541 Moderate persistent asthma with (acute) exacerbation: Secondary | ICD-10-CM | POA: Diagnosis not present

## 2019-09-30 DIAGNOSIS — J4520 Mild intermittent asthma, uncomplicated: Secondary | ICD-10-CM | POA: Diagnosis not present

## 2019-10-02 ENCOUNTER — Other Ambulatory Visit: Payer: Self-pay

## 2019-10-02 ENCOUNTER — Other Ambulatory Visit
Admission: RE | Admit: 2019-10-02 | Discharge: 2019-10-02 | Disposition: A | Discharge status: Home / Self Care | Payer: Medicare HMO | Source: Ambulatory Visit | Attending: Internal Medicine | Admitting: Internal Medicine

## 2019-10-02 DIAGNOSIS — Z01812 Encounter for preprocedural laboratory examination: Secondary | ICD-10-CM | POA: Diagnosis not present

## 2019-10-02 DIAGNOSIS — Z20828 Contact with and (suspected) exposure to other viral communicable diseases: Secondary | ICD-10-CM | POA: Insufficient documentation

## 2019-10-02 LAB — SARS CORONAVIRUS 2 (TAT 6-24 HRS): SARS Coronavirus 2: NEGATIVE

## 2019-10-03 ENCOUNTER — Other Ambulatory Visit: Payer: Self-pay | Admitting: Cardiovascular Disease

## 2019-10-03 ENCOUNTER — Ambulatory Visit: Payer: Medicare HMO | Attending: Internal Medicine

## 2019-10-03 DIAGNOSIS — R0602 Shortness of breath: Secondary | ICD-10-CM | POA: Diagnosis not present

## 2019-10-03 DIAGNOSIS — J4520 Mild intermittent asthma, uncomplicated: Secondary | ICD-10-CM | POA: Diagnosis not present

## 2019-10-03 MED ORDER — ALBUTEROL SULFATE (2.5 MG/3ML) 0.083% IN NEBU
2.5000 mg | INHALATION_SOLUTION | Freq: Once | RESPIRATORY_TRACT | Status: AC
  Administered 2019-10-03: 2.5 mg via RESPIRATORY_TRACT
  Filled 2019-10-03: qty 3

## 2019-10-04 ENCOUNTER — Other Ambulatory Visit: Payer: Self-pay

## 2019-10-04 ENCOUNTER — Encounter: Payer: Medicare HMO | Attending: Cardiovascular Disease

## 2019-10-04 DIAGNOSIS — I5032 Chronic diastolic (congestive) heart failure: Secondary | ICD-10-CM

## 2019-10-04 NOTE — Progress Notes (Signed)
Virtual Visit Completed. Time [phone]

## 2019-10-09 ENCOUNTER — Other Ambulatory Visit: Payer: Self-pay

## 2019-10-09 VITALS — Ht 66.0 in | Wt 209.8 lb

## 2019-10-09 DIAGNOSIS — I5032 Chronic diastolic (congestive) heart failure: Secondary | ICD-10-CM

## 2019-10-09 NOTE — Progress Notes (Signed)
Pulmonary Individual Treatment Plan  Patient Details  Name: Rachel Torres MRN: [id number] Date of Birth: January 06, 1956 Referring Provider:     Pulmonary Rehab from 10/09/2019 in [facility]iac and Pulmonary Rehab  Referring Provider  Gollan      Initial Encounter Date:    Pulmonary Rehab from 10/09/2019 in [facility] Cardiac and Pulmonary Rehab  Date  10/09/19      Visit Diagnosis: Heart failure, diastolic, chronic ([place])  Patient's Home Medications on Admission:  Current Outpatient Medications:  .  albuterol (PROVENTIL HFA;VENTOLIN HFA) 108 (90 Base) MCG/ACT inhaler, Inhale 1-2 puffs into the lungs every 6 (six) hours as needed for wheezing or shortness of breath., Disp: 1 Inhaler, Rfl: 0 .  ALPRAZolam (XANAX) 0.5 MG tablet, Take 1 tablet (0.5 mg total) by mouth daily as needed for anxiety., Disp: 30 tablet, Rfl: 0 .  aspirin EC 81 MG tablet, Take 81 mg by mouth at bedtime., Disp: , Rfl:  .  atorvastatin (LIPITOR) 20 MG tablet, TAKE 1 TABLET EVERY DAY, Disp: 90 tablet, Rfl: 3 .  carvedilol (COREG) 6.25 MG tablet, TAKE 1 TABLET TWICE DAILY WITH MEALS, Disp: 180 tablet, Rfl: 0 .  Cholecalciferol (VITAMIN D3) 5000 units CAPS, Take 1 capsule by mouth daily., Disp: , Rfl:  .  cyclobenzaprine (FLEXERIL) 10 MG tablet, Take 0.5-1 tablets (5-10 mg total) by mouth 3 (three) times daily as needed. For headache, Disp: 90 tablet, Rfl: 1 .  furosemide (LASIX) 20 MG tablet, TAKE 1 TABLET (20 MG TOTAL) BY MOUTH DAILY. NEED MD APPOINTMENT, Disp: 90 tablet, Rfl: 3 .  Krill Oil (OMEGA-3) 500 MG CAPS, Take 1 capsule by mouth daily., Disp: , Rfl:  .  lisinopril (ZESTRIL) 5 MG tablet, TAKE 1/2 TABLET EVERY DAY, Disp: 45 tablet, Rfl: 2 .  magnesium gluconate (MAGONATE) 500 MG tablet, Take 500 mg by mouth at bedtime., Disp: , Rfl:  .  Misc Natural Products (TART CHERRY ADVANCED) CAPS, Take 1 capsule by mouth daily., Disp: , Rfl:  .  naproxen sodium (ANAPROX) 220 MG tablet, Take 440 mg by mouth 2 (two) times daily  as needed (for headaches/pain)., Disp: , Rfl:  .  nitroGLYCERIN (NITROSTAT) 0.4 MG SL tablet, Place 1 tablet (0.4 mg total) under the tongue every 5 (five) minutes as needed for chest pain., Disp: 25 tablet, Rfl: 3 .  omeprazole (PRILOSEC) 40 MG capsule, TAKE 1 CAPSULE EVERY DAY, Disp: 90 capsule, Rfl: 1 .  potassium chloride (K-DUR) 10 MEQ tablet, Take 1 tablet (10 mEq total) by mouth 2 (two) times daily as needed., Disp: 180 tablet, Rfl: 3 .  promethazine (PHENERGAN) 25 MG tablet, Take 0.5 tablets (12.5 mg total) by mouth every 8 (eight) hours as needed for nausea., Disp: 90 tablet, Rfl: 1 .  venlafaxine XR (EFFEXOR-XR) 150 MG 24 hr capsule, TAKE 1 CAPSULE EVERY DAY WITH BREAKFAST, Disp: 90 capsule, Rfl: 1  Past Medical History: Past Medical History:  Diagnosis Date  . AAA (abdominal aortic aneurysm) ([name])    a. Chronic w/o evidence of aneurysmal dil on CTA 01/2016.  Marland Kitchen Allergy   . Anemia   . Anxiety   . CAD (coronary artery disease)    a. 08/2010 s/p CABG x 1 (VG->RCA) @ time of Ao dissection repair; b. 01/2016 Lexiscan MV: mid antsept/apical defect w/ ? peri-infarct ischemia-->likely attenuation-->Med Rx.  . Chronic combined systolic (congestive) and diastolic (congestive) heart failure ([place])    a. 2012 EF 30-35%; b. 04/2015 EF 40-45%; c. 01/2016 Echo: Ef 55-65%, nl  AoV bioprosthesis, nl RV, nl PASP.  Marland Kitchen Depression   . Gallbladder sludge   . GERD (gastroesophageal reflux disease)   . History of Bicuspid Aortic Valve    a. 08/2010 s/p AVR @ time of Ao dissection repair; b. 01/2016 Echo: Ef 55-65%, nl AoV bioprosthesis, nl RV, nl PASP.  Marland Kitchen Hx of repair of dissecting thoracic aortic aneurysm, [place] type A    a. 08/2010 s/p repair with AVR and VG->RCA; b. 01/2016 CTA: stable appearance of Asc Thoracic Aortic graft. Opacification of flase lumen of chronic abd Ao dissection w/ retrograde flow through lumbar arteries. No aneurysmal dil of flase lumen.  . Hyperlipidemia   . Hypertensive heart disease    . IBS (irritable bowel syndrome)   . Internal hemorrhoids   . Marfan syndrome    pt denies  . Migraine   . Obstructive sleep apnea   . Osteoporosis   . Pneumonia   . RA (rheumatoid arthritis) ([place])    a. Followed by Dr. [name]    Tobacco Use: Social History   Tobacco Use  Smoking Status Former Smoker  . Packs/day: 0.75  . Years: 1.50  . Pack years: 1.12  . Types: Cigarettes  . Quit date: 12/20/1978  . Years since quitting: 40.8  Smokeless Tobacco Never Used    Labs: Recent Chemical engineer    Labs for ITP Cardiac and Pulmonary Rehab Latest Ref Rng & Units 07/12/2016 11/23/2016 11/24/2017 06/26/2018 06/07/2019   Cholestrol 0 - 200 mg/dL - 132 193 166 137   LDLCALC 0 - 99 mg/dL - 69 114(H) 87 68   LDLDIRECT mg/dL - - - - -   HDL >39.00 mg/dL - 45.80 63.20 56.70 47.40   Trlycerides 0.0 - 149.0 mg/dL - 86.0 78.0 111.0 108.0   Hemoglobin A1c 4.6 - 6.5 % 5.3 5.5 6.0 6.1 6.0       Pulmonary Assessment Scores:   UCSD: Self-administered rating of dyspnea associated with activities of daily living (ADLs) 6-point scale (0 = "not at all" to 5 = "maximal or unable to do because of breathlessness")  Scoring Scores range from 0 to 120.  Minimally important difference is 5 units  CAT: CAT can identify the health impairment of COPD patients and is better correlated with disease progression.  CAT has a scoring range of zero to 40. The CAT score is classified into four groups of low (less than 10), medium (10 - 20), high (21-30) and very high (31-40) based on the impact level of disease on health status. A CAT score over 10 suggests significant symptoms.  A worsening CAT score could be explained by an exacerbation, poor medication adherence, poor inhaler technique, or progression of COPD or comorbid conditions.  CAT MCID is 2 points  mMRC: mMRC (Modified Medical Research Council) Dyspnea Scale is used to assess the degree of baseline functional disability in patients of respiratory  disease due to dyspnea. No minimal important difference is established. A decrease in score of 1 point or greater is considered a positive change.   Pulmonary Function Assessment:   Exercise Target Goals: Exercise Program Goal: Individual exercise prescription set using results from initial 6 min walk test and THRR while considering  patient's activity barriers and safety.   Exercise Prescription Goal: Initial exercise prescription builds to 30-45 minutes a day of aerobic activity, 2-3 days per week.  Home exercise guidelines will be given to patient during program as part of exercise prescription that the participant will acknowledge.  Activity  Barriers & Risk Stratification:   6 Minute Walk: 6 Minute Walk    Row Name 10/09/19 1314         6 Minute Walk   Phase  Initial     Distance  914 feet     Walk Time  6 minutes     # of Rest Breaks  0     MPH  1.73     METS  2.07     RPE  15     Perceived Dyspnea   3     VO2 Peak  7.2     Symptoms  Yes (comment)     Comments  Patient had some pain between shoulder blades that she attributed to wearing the wrong undergarments. Patient reported that she has had it before. Patient was informed that pain could be angina or sign/symptom of CAD in females. Patient will pay attention to when and what she is doing when pain occurs and if wearinf different more supportive sports bra does in fact make it better.     Resting HR  80 bpm     Resting BP  118/70     Resting Oxygen Saturation   95 %     Exercise Oxygen Saturation  during 6 min walk  94 %     Max Ex. HR  106 bpm     Max Ex. BP  148/80     2 Minute Post BP  118/80       Interval HR   1 Minute HR  99     2 Minute HR  106     3 Minute HR  101     4 Minute HR  102     5 Minute HR  100     6 Minute HR  103     2 Minute Post HR  91     Interval Heart Rate?  Yes       Interval Oxygen   Interval Oxygen?  Yes     Baseline Oxygen Saturation %  95 %     1 Minute Oxygen Saturation %   95 %     1 Minute Liters of Oxygen  0 L     2 Minute Oxygen Saturation %  94 %     2 Minute Liters of Oxygen  0 L     3 Minute Oxygen Saturation %  95 %     3 Minute Liters of Oxygen  0 L     4 Minute Oxygen Saturation %  94 %     4 Minute Liters of Oxygen  0 L     5 Minute Oxygen Saturation %  95 %     5 Minute Liters of Oxygen  0 L     6 Minute Oxygen Saturation %  94 %     6 Minute Liters of Oxygen  0 L     2 Minute Post Oxygen Saturation %  95 %     2 Minute Post Liters of Oxygen  0 L       Oxygen Initial Assessment: Oxygen Initial Assessment - 10/04/19 1444      Home Oxygen   Home Oxygen Device  None    Sleep Oxygen Prescription  None   patient did not tolerate CPAP   Home Exercise Oxygen Prescription  None    Home at Rest Exercise Oxygen Prescription  None    Compliance with Home Oxygen Use  No  does not use CPAP     Initial 6 min Walk   Oxygen Used  None      Program Oxygen Prescription   Program Oxygen Prescription  None      Intervention   Short Term Goals  To learn and demonstrate proper use of respiratory medications;To learn and demonstrate proper pursed lip breathing techniques or other breathing techniques.;To learn and understand importance of maintaining oxygen saturations>88%;To learn and understand importance of monitoring SPO2 with pulse oximeter and demonstrate accurate use of the pulse oximeter.    Long  Term Goals  Demonstrates proper use of MDI's;Compliance with respiratory medication;Exhibits proper breathing techniques, such as pursed lip breathing or other method taught during program session;Maintenance of O2 saturations>88%;Verbalizes importance of monitoring SPO2 with pulse oximeter and return demonstration       Oxygen Re-Evaluation:   Oxygen Discharge (Final Oxygen Re-Evaluation):   Initial Exercise Prescription: Initial Exercise Prescription - 10/09/19 1300      Date of Initial Exercise RX and Referring Provider   Date  10/09/19     Referring Provider  Gollan      Treadmill   MPH  1.2    Grade  0    Minutes  3    METs  2      NuStep   Level  1    SPM  80    Minutes  15    METs  2      Arm Ergometer   Level  1    Watts  15    RPM  20    Minutes  5    METs  1.5      T5 Nustep   Level  1    SPM  60    Minutes  15    METs  1.5      Biostep-RELP   Level  1    SPM  50    Minutes  15    METs  1      Prescription Details   Duration  Progress to 30 minutes of continuous aerobic without signs/symptoms of physical distress      Intensity   Ratings of Perceived Exertion  11-15    Perceived Dyspnea  0-4      Progression   Progression  Continue progressive overload as per policy without signs/symptoms or physical distress.      Resistance Training   Training Prescription  Yes    Weight  3    Reps  10-15       Perform Capillary Blood Glucose checks as needed.  Exercise Prescription Changes:   Exercise Comments:   Exercise Goals and Review:  Exercise Goals    Row Name 10/09/19 1337             Exercise Goals   Increase Physical Activity  Yes       Intervention  Provide advice, education, support and counseling about physical activity/exercise needs.;Develop an individualized exercise prescription for aerobic and resistive training based on initial evaluation findings, risk stratification, comorbidities and participant's personal goals.       Expected Outcomes  Short Term: Attend rehab on a regular basis to increase amount of physical activity.;Long Term: Add in home exercise to make exercise part of routine and to increase amount of physical activity.;Long Term: Exercising regularly at least 3-5 days a week.       Increase Strength and Stamina  Yes       Intervention  Provide advice,  education, support and counseling about physical activity/exercise needs.;Develop an individualized exercise prescription for aerobic and resistive training based on initial evaluation findings, risk  stratification, comorbidities and participant's personal goals.       Expected Outcomes  Short Term: Increase workloads from initial exercise prescription for resistance, speed, and METs.;Short Term: Perform resistance training exercises routinely during rehab and add in resistance training at home;Long Term: Improve cardiorespiratory fitness, muscular endurance and strength as measured by increased METs and functional capacity (6MWT)       Able to understand and use rate of perceived exertion (RPE) scale  Yes       Intervention  Provide education and explanation on how to use RPE scale       Expected Outcomes  Short Term: Able to use RPE daily in rehab to express subjective intensity level;Long Term:  Able to use RPE to guide intensity level when exercising independently       Able to understand and use Dyspnea scale  Yes       Intervention  Provide education and explanation on how to use Dyspnea scale       Expected Outcomes  Short Term: Able to use Dyspnea scale daily in rehab to express subjective sense of shortness of breath during exertion;Long Term: Able to use Dyspnea scale to guide intensity level when exercising independently       Knowledge and understanding of Target Heart Rate Range (THRR)  Yes       Intervention  Provide education and explanation of THRR including how the numbers were predicted and where they are located for reference       Expected Outcomes  Short Term: Able to state/look up THRR;Short Term: Able to use daily as guideline for intensity in rehab;Long Term: Able to use THRR to govern intensity when exercising independently       Able to check pulse independently  Yes       Intervention  Provide education and demonstration on how to check pulse in carotid and radial arteries.;Review the importance of being able to check your own pulse for safety during independent exercise       Expected Outcomes  Short Term: Able to explain why pulse checking is important during independent  exercise;Long Term: Able to check pulse independently and accurately       Understanding of Exercise Prescription  Yes       Intervention  Provide education, explanation, and written materials on patient's individual exercise prescription       Expected Outcomes  Short Term: Able to explain program exercise prescription;Long Term: Able to explain home exercise prescription to exercise independently          Exercise Goals Re-Evaluation :   Discharge Exercise Prescription (Final Exercise Prescription Changes):   Nutrition:  Target Goals: Understanding of nutrition guidelines, daily intake of sodium <1546m, cholesterol <2081m calories 30% from fat and 7% or less from saturated fats, daily to have 5 or more servings of fruits and vegetables.  Biometrics: Pre Biometrics - 10/09/19 1319      Pre Biometrics   Height  _0  (1.676 m)    Weight  209 lb 12.8 oz (95.2 kg)    BMI (Calculated)  33.88        Nutrition Therapy Plan and Nutrition Goals:   Nutrition Assessments:   Nutrition Goals Re-Evaluation:   Nutrition Goals Discharge (Final Nutrition Goals Re-Evaluation):   Psychosocial: Target Goals: Acknowledge presence or absence of significant depression and/or stress, maximize  coping skills, provide positive support system. Participant is able to verbalize types and ability to use techniques and skills needed for reducing stress and depression.   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 Her 42 year old mother lives with her.      Family Dynamics   Good Support System?  Yes    Comments  She looks to her paster and her therapist for support.      Barriers   Psychosocial barriers to  participate in program  The patient should benefit from training in stress management and relaxation.;Psychosocial barriers identified (see note)      Screening Interventions   Interventions  Encouraged to exercise    Expected Outcomes  Long Term goal: The participant improves quality of Life and PHQ9 Scores as seen by post scores and/or verbalization of changes;Short Term goal: Identification and review with participant of any Quality of Life or Depression concerns found by scoring the questionnaire.;Long Term Goal: Stressors or current issues are controlled or eliminated.;Short Term goal: Utilizing psychosocial counselor, staff and physician to assist with identification of specific Stressors or current issues interfering with healing process. Setting desired goal for each stressor or current issue identified.       Quality of Life Scores:  Scores of 19 and below usually indicate a poorer quality of life in these areas.  A difference of  2-3 points is a clinically meaningful difference.  A difference of 2-3 points in the total score of the Quality of Life Index has been associated with significant improvement in overall quality of life, self-image, physical symptoms, and general health in studies assessing change in quality of life.  PHQ-9: Recent Review Flowsheet Data    Depression screen [facility] 2/9 06/05/2019 12/07/2017 06/28/2017   Decreased Interest 1 0 1   Down, Depressed, Hopeless 1 0 0   PHQ - 2 Score 2 0 1   Altered sleeping 2 0 3   Tired, decreased energy 2 0 2   Change in appetite 2 0 3   Feeling bad or failure about yourself  0 0 1   Trouble concentrating 1 0 1   Moving slowly or fidgety/restless 0 0 1   Suicidal thoughts 0 0 0   PHQ-9 Score 9 0 12   Difficult doing work/chores Not difficult at all Not difficult at all -     Interpretation of Total Score  Total Score Depression Severity:  1-4 = Minimal depression, 5-9 = Mild depression, 10-14 = Moderate depression, 15-19 =  Moderately severe depression, 20-27 = Severe depression   Psychosocial Evaluation and Intervention:   Psychosocial Re-Evaluation:   Psychosocial Discharge (Final Psychosocial Re-Evaluation):   Education: Education Goals: Education classes will be provided on a weekly basis, covering required topics. Participant will state understanding/return demonstration of topics presented.  Learning Barriers/Preferences: Learning Barriers/Preferences - 10/04/19 1441      Learning Barriers/Preferences   Learning Barriers  None    Learning Preferences  None       Education Topics:  Initial Evaluation Education: - Verbal, written and demonstration of respiratory meds, oximetry and breathing techniques. Instruction on use of nebulizers and MDIs and importance of monitoring MDI activations.   Pulmonary  Rehab from 10/04/2019 in [facility] Cardiac and Pulmonary Rehab  Date  10/04/19  Educator  [facility]  Instruction Review Code  1- Verbalizes Understanding      General Nutrition Guidelines/Fats and Fiber: -Group instruction provided by verbal, written material, models and posters to present the general guidelines for heart healthy nutrition. Gives an explanation and review of dietary fats and fiber.   Controlling Sodium/Reading Food Labels: -Group verbal and written material supporting the discussion of sodium use in heart healthy nutrition. Review and explanation with models, verbal and written materials for utilization of the food label.   Exercise Physiology & General Exercise Guidelines: - Group verbal and written instruction with models to review the exercise physiology of the cardiovascular system and associated critical values. Provides general exercise guidelines with specific guidelines to those with heart or lung disease.    Aerobic Exercise & Resistance Training: - Gives group verbal and written instruction on the various components of exercise. Focuses on aerobic and resistive training  programs and the benefits of this training and how to safely progress through these programs.   Flexibility, Balance, Mind/Body Relaxation: Provides group verbal/written instruction on the benefits of flexibility and balance training, including mind/body exercise modes such as yoga, pilates and tai chi.  Demonstration and skill practice provided.   Stress and Anxiety: - Provides group verbal and written instruction about the health risks of elevated stress and causes of high stress.  Discuss the correlation between heart/lung disease and anxiety and treatment options. Review healthy ways to manage with stress and anxiety.   Depression: - Provides group verbal and written instruction on the correlation between heart/lung disease and depressed mood, treatment options, and the stigmas associated with seeking treatment.   Exercise & Equipment Safety: - Individual verbal instruction and demonstration of equipment use and safety with use of the equipment.   Pulmonary Rehab from 10/09/2019 in [facility] Cardiac and Pulmonary Rehab  Date  10/09/19  Educator  [facility]  Instruction Review Code  1- Verbalizes Understanding      Infection Prevention: - Provides verbal and written material to individual with discussion of infection control including proper hand washing and proper equipment cleaning during exercise session.   Pulmonary Rehab from 10/09/2019 in [facility] Cardiac and Pulmonary Rehab  Date  10/09/19  Educator  [place]  Instruction Review Code  1- Verbalizes Understanding      Falls Prevention: - Provides verbal and written material to individual with discussion of falls prevention and safety.   Pulmonary Rehab from 10/09/2019 in [facility] Cardiac and Pulmonary Rehab  Date  10/09/19  Educator  [place]  Instruction Review Code  1- Verbalizes Understanding      Diabetes: - Individual verbal and written instruction to review signs/symptoms of diabetes, desired ranges of glucose level fasting, after meals and  with exercise. Advice that pre and post exercise glucose checks will be done for 3 sessions at entry of program.   Chronic Lung Diseases: - Group verbal and written instruction to review updates, respiratory medications, advancements in procedures and treatments. Discuss use of supplemental oxygen including available portable oxygen systems, continuous and intermittent flow rates, concentrators, personal use and safety guidelines. Review proper use of inhaler and spacers. Provide informative websites for self-education.    Energy Conservation: - Provide group verbal and written instruction for methods to conserve energy, plan and organize activities. Instruct on pacing techniques, use of adaptive equipment and posture/positioning to relieve shortness of breath.   Triggers and Exacerbations: - Group verbal and written  instruction to review types of environmental triggers and ways to prevent exacerbations. Discuss weather changes, air quality and the benefits of nasal washing. Review warning signs and symptoms to help prevent infections. Discuss techniques for effective airway clearance, coughing, and vibrations.   AED/CPR: - Group verbal and written instruction with the use of models to demonstrate the basic use of the AED with the basic ABC's of resuscitation.   Anatomy and Physiology of the Lungs: - Group verbal and written instruction with the use of models to provide basic lung anatomy and physiology related to function, structure and complications of lung disease.   Anatomy & Physiology of the Heart: - Group verbal and written instruction and models provide basic cardiac anatomy and physiology, with the coronary electrical and arterial systems. Review of Valvular disease and Heart Failure   Cardiac Medications: - Group verbal and written instruction to review commonly prescribed medications for heart disease. Reviews the medication, class of the drug, and side effects.   Know Your  Numbers and Risk Factors: -Group verbal and written instruction about important numbers in your health.  Discussion of what are risk factors and how they play a role in the disease process.  Review of Cholesterol, Blood Pressure, Diabetes, and BMI and the role they play in your overall health.   Sleep Hygiene: -Provides group verbal and written instruction about how sleep can affect your health.  Define sleep hygiene, discuss sleep cycles and impact of sleep habits. Review good sleep hygiene tips.    Other: -Provides group and verbal instruction on various topics (see comments)    Knowledge Questionnaire Score:    Core Components/Risk Factors/Patient Goals at Admission: Personal Goals and Risk Factors at Admission - 10/04/19 1444      Core Components/Risk Factors/Patient Goals on Admission    Weight Management  Obesity;Weight Loss    Improve shortness of breath with ADL's  Yes    Intervention  Provide education, individualized exercise plan and daily activity instruction to help decrease symptoms of SOB with activities of daily living.    Expected Outcomes  Short Term: Improve cardiorespiratory fitness to achieve a reduction of symptoms when performing ADLs;Long Term: Be able to perform more ADLs without symptoms or delay the onset of symptoms    Heart Failure  Yes    Intervention  Provide a combined exercise and nutrition program that is supplemented with education, support and counseling about heart failure. Directed toward relieving symptoms such as shortness of breath, decreased exercise tolerance, and extremity edema.    Expected Outcomes  Improve functional capacity of life;Short term: Attendance in program 2-3 days a week with increased exercise capacity. Reported lower sodium intake. Reported increased fruit and vegetable intake. Reports medication compliance.;Short term: Daily weights obtained and reported for increase. Utilizing diuretic protocols set by physician.;Long term:  Adoption of self-care skills and reduction of barriers for early signs and symptoms recognition and intervention leading to self-care maintenance.    Hypertension  Yes    Intervention  Provide education on lifestyle modifcations including regular physical activity/exercise, weight management, moderate sodium restriction and increased consumption of fresh fruit, vegetables, and low fat dairy, alcohol moderation, and smoking cessation.;Monitor prescription use compliance.    Expected Outcomes  Short Term: Continued assessment and intervention until BP is < 140/49m HG in hypertensive participants. < 130/855mHG in hypertensive participants with diabetes, heart failure or chronic kidney disease.;Long Term: Maintenance of blood pressure at goal levels.    Lipids  Yes    Intervention  Provide education and support for participant on nutrition & aerobic/resistive exercise along with prescribed medications to achieve LDL <55m, HDL >436m    Expected Outcomes  Short Term: Participant states understanding of desired cholesterol values and is compliant with medications prescribed. Participant is following exercise prescription and nutrition guidelines.;Long Term: Cholesterol controlled with medications as prescribed, with individualized exercise RX and with personalized nutrition plan. Value goals: LDL < 7050mHDL > 40 mg.       Core Components/Risk Factors/Patient Goals Review:    Core Components/Risk Factors/Patient Goals at Discharge (Final Review):    ITP Comments: ITP Comments    Row Name 10/04/19 1451 10/04/19 1457         ITP Comments  Virtual Visit Completed.  -         Comments: Initial ITP

## 2019-10-09 NOTE — Patient Instructions (Signed)
Patient Instructions  Patient Details  Name: Rachel Torres MRN: [id number] Date of Birth: October 16, 1956 Referring Provider:  Minna Merritts, MD  Below are your personal goals for exercise, nutrition, and risk factors. Our goal is to help you stay on track towards obtaining and maintaining these goals. We will be discussing your progress on these goals with you throughout the program.  Initial Exercise Prescription: Initial Exercise Prescription - 10/09/19 1300      Date of Initial Exercise RX and Referring Provider   Date  10/09/19    Referring Provider  Gollan      Treadmill   MPH  1.2    Grade  0    Minutes  3    METs  2      NuStep   Level  1    SPM  80    Minutes  15    METs  2      Arm Ergometer   Level  1    Watts  15    RPM  20    Minutes  5    METs  1.5      T5 Nustep   Level  1    SPM  60    Minutes  15    METs  1.5      Biostep-RELP   Level  1    SPM  50    Minutes  15    METs  1      Prescription Details   Duration  Progress to 30 minutes of continuous aerobic without signs/symptoms of physical distress      Intensity   Ratings of Perceived Exertion  11-15    Perceived Dyspnea  0-4      Progression   Progression  Continue progressive overload as per policy without signs/symptoms or physical distress.      Resistance Training   Training Prescription  Yes    Weight  3    Reps  10-15       Exercise Goals: Frequency: Be able to perform aerobic exercise two to three times per week in program working toward 2-5 days per week of home exercise.  Intensity: Work with a perceived exertion of 11 (fairly light) - 15 (hard) while following your exercise prescription.  We will make changes to your prescription with you as you progress through the program.   Duration: Be able to do 30 to 45 minutes of continuous aerobic exercise in addition to a 5 minute warm-up and a 5 minute cool-down routine.   Nutrition Goals: Your personal nutrition goals  will be established when you do your nutrition analysis with the dietician.  The following are general nutrition guidelines to follow: Cholesterol < 200mg /day Sodium < 1500mg /day Fiber: Women over 50 yrs - 21 grams per day  Personal Goals: Personal Goals and Risk Factors at Admission - 10/04/19 1444      Core Components/Risk Factors/Patient Goals on Admission    Weight Management  Obesity;Weight Loss    Improve shortness of breath with ADL's  Yes    Intervention  Provide education, individualized exercise plan and daily activity instruction to help decrease symptoms of SOB with activities of daily living.    Expected Outcomes  Short Term: Improve cardiorespiratory fitness to achieve a reduction of symptoms when performing ADLs;Long Term: Be able to perform more ADLs without symptoms or delay the onset of symptoms    Heart Failure  Yes    Intervention  Provide a  combined exercise and nutrition program that is supplemented with education, support and counseling about heart failure. Directed toward relieving symptoms such as shortness of breath, decreased exercise tolerance, and extremity edema.    Expected Outcomes  Improve functional capacity of life;Short term: Attendance in program 2-3 days a week with increased exercise capacity. Reported lower sodium intake. Reported increased fruit and vegetable intake. Reports medication compliance.;Short term: Daily weights obtained and reported for increase. Utilizing diuretic protocols set by physician.;Long term: Adoption of self-care skills and reduction of barriers for early signs and symptoms recognition and intervention leading to self-care maintenance.    Hypertension  Yes    Intervention  Provide education on lifestyle modifcations including regular physical activity/exercise, weight management, moderate sodium restriction and increased consumption of fresh fruit, vegetables, and low fat dairy, alcohol moderation, and smoking cessation.;Monitor  prescription use compliance.    Expected Outcomes  Short Term: Continued assessment and intervention until BP is < 140/51mm HG in hypertensive participants. < 130/59mm HG in hypertensive participants with diabetes, heart failure or chronic kidney disease.;Long Term: Maintenance of blood pressure at goal levels.    Lipids  Yes    Intervention  Provide education and support for participant on nutrition & aerobic/resistive exercise along with prescribed medications to achieve LDL 70mg , HDL >40mg .    Expected Outcomes  Short Term: Participant states understanding of desired cholesterol values and is compliant with medications prescribed. Participant is following exercise prescription and nutrition guidelines.;Long Term: Cholesterol controlled with medications as prescribed, with individualized exercise RX and with personalized nutrition plan. Value goals: LDL < 70mg , HDL > 40 mg.       Tobacco Use Initial Evaluation: Social History   Tobacco Use  Smoking Status Former Smoker  . Packs/day: 0.75  . Years: 1.50  . Pack years: 1.12  . Types: Cigarettes  . Quit date: 12/20/1978  . Years since quitting: 40.8  Smokeless Tobacco Never Used    Exercise Goals and Review: Exercise Goals    Row Name 10/09/19 1337             Exercise Goals   Increase Physical Activity  Yes       Intervention  Provide advice, education, support and counseling about physical activity/exercise needs.;Develop an individualized exercise prescription for aerobic and resistive training based on initial evaluation findings, risk stratification, comorbidities and participant's personal goals.       Expected Outcomes  Short Term: Attend rehab on a regular basis to increase amount of physical activity.;Long Term: Add in home exercise to make exercise part of routine and to increase amount of physical activity.;Long Term: Exercising regularly at least 3-5 days a week.       Increase Strength and Stamina  Yes       Intervention   Provide advice, education, support and counseling about physical activity/exercise needs.;Develop an individualized exercise prescription for aerobic and resistive training based on initial evaluation findings, risk stratification, comorbidities and participant's personal goals.       Expected Outcomes  Short Term: Increase workloads from initial exercise prescription for resistance, speed, and METs.;Short Term: Perform resistance training exercises routinely during rehab and add in resistance training at home;Long Term: Improve cardiorespiratory fitness, muscular endurance and strength as measured by increased METs and functional capacity (6MWT)       Able to understand and use rate of perceived exertion (RPE) scale  Yes       Intervention  Provide education and explanation on how to use RPE scale  Expected Outcomes  Short Term: Able to use RPE daily in rehab to express subjective intensity level;Long Term:  Able to use RPE to guide intensity level when exercising independently       Able to understand and use Dyspnea scale  Yes       Intervention  Provide education and explanation on how to use Dyspnea scale       Expected Outcomes  Short Term: Able to use Dyspnea scale daily in rehab to express subjective sense of shortness of breath during exertion;Long Term: Able to use Dyspnea scale to guide intensity level when exercising independently       Knowledge and understanding of Target Heart Rate Range (THRR)  Yes       Intervention  Provide education and explanation of THRR including how the numbers were predicted and where they are located for reference       Expected Outcomes  Short Term: Able to state/look up THRR;Short Term: Able to use daily as guideline for intensity in rehab;Long Term: Able to use THRR to govern intensity when exercising independently       Able to check pulse independently  Yes       Intervention  Provide education and demonstration on how to check pulse in carotid and  radial arteries.;Review the importance of being able to check your own pulse for safety during independent exercise       Expected Outcomes  Short Term: Able to explain why pulse checking is important during independent exercise;Long Term: Able to check pulse independently and accurately       Understanding of Exercise Prescription  Yes       Intervention  Provide education, explanation, and written materials on patient's individual exercise prescription       Expected Outcomes  Short Term: Able to explain program exercise prescription;Long Term: Able to explain home exercise prescription to exercise independently          Copy of goals given to participant.

## 2019-10-10 ENCOUNTER — Encounter: Payer: Medicare HMO | Admitting: *Deleted

## 2019-10-10 ENCOUNTER — Ambulatory Visit (INDEPENDENT_AMBULATORY_CARE_PROVIDER_SITE_OTHER): Payer: Medicare HMO | Admitting: Psychology

## 2019-10-10 DIAGNOSIS — F4323 Adjustment disorder with mixed anxiety and depressed mood: Secondary | ICD-10-CM

## 2019-10-10 DIAGNOSIS — I5032 Chronic diastolic (congestive) heart failure: Secondary | ICD-10-CM | POA: Diagnosis not present

## 2019-10-10 NOTE — Progress Notes (Signed)
Daily Session Note  Patient Details  Name: Rachel Torres MRN: [id number] Date of Birth: 1956-05-23 Referring Provider:     Pulmonary Rehab from 10/09/2019 in [facility] Cardiac and Pul[facility] Rehab  Referring Provider  Gollan      Encounter Date: 10/10/2019  Check In:      Social History   Tobacco Use  Smoking Status Former Smoker  . Packs/day: 0.75  . Years: 1.50  . Pack years: 1.12  . Types: Cigarettes  . Quit date: 12/20/1978  . Years since quitting: 40.8  Smokeless Tobacco Never Used    Goals Met:  Proper associated with RPD/PD & O2 Sat Using PLB without cueing & demonstrates good technique Exercise tolerated well Queuing for purse lip breathing Strength training completed today  Goals Unmet:  Not Applicable  Comments: First full day of exercise!  Patient was oriented to gym and equipment including functions, settings, policies, and procedures.  Patient's individual exercise prescription and treatment plan were reviewed.  All starting workloads were established based on the results of the 6 minute walk test done at initial orientation visit.  The plan for exercise progression was also introduced and progression will be customized based on patient's performance and goals.    Dr. Emily Filbert is Medical Director for [place] and [name].

## 2019-10-12 ENCOUNTER — Other Ambulatory Visit: Payer: Self-pay

## 2019-10-12 ENCOUNTER — Encounter: Payer: Medicare HMO | Admitting: *Deleted

## 2019-10-12 DIAGNOSIS — I5032 Chronic diastolic (congestive) heart failure: Secondary | ICD-10-CM

## 2019-10-12 NOTE — Progress Notes (Signed)
Daily Session Note  Patient Details  Name: Rachel Torres MRN: [id number] Date of Birth: [birth year] Referring Provider:     Pulmonary Rehab from 10/09/2019 in [facility] Cardiac and Pulmonary Rehab  Referring Provider  Gollan      Encounter Date: 10/12/2019  Check In: Session Check In - 10/12/19 0921      Check-In   Supervising physician immediately available to respond to emergencies  See telemetry face sheet for immediately available ER MD    Location  [facility]-Cardiac & Pulmonary Rehab    Staff Present  Rachel Lark, RN, BSN, CCRP;Rachel Torres, [place], ACSM CEP, Exercise Physiologist    Virtual Visit  No    Medication changes reported      No    Fall or balance concerns reported     No    Warm-up and Cool-down  Performed on first and last piece of equipment    Resistance Training Performed  Yes    VAD Patient?  No    PAD/SET Patient?  No      Pain Assessment   Currently in Pain?  No/denies          Social History   Tobacco Use  Smoking Status Former Smoker  . Packs/day: 0.75  . Years: 1.50  . Pack years: 1.12  . Types: Cigarettes  . Quit date: 12/20/1978  . Years since quitting: 40.8  Smokeless Tobacco Never Used    Goals Met:  Exercise tolerated well No report of cardiac concerns or symptoms  Goals Unmet:  Not Applicable  Comments    Pt able to follow exercise prescription today without complaint.  Will continue to monitor for progression.    Dr. Emily Torres is Medical Director for [name] and [name].

## 2019-10-15 ENCOUNTER — Encounter: Payer: Medicare HMO | Admitting: *Deleted

## 2019-10-15 ENCOUNTER — Other Ambulatory Visit: Payer: Self-pay

## 2019-10-15 DIAGNOSIS — I5032 Chronic diastolic (congestive) heart failure: Secondary | ICD-10-CM

## 2019-10-15 NOTE — Progress Notes (Signed)
Daily Session Note  Patient Details  Name: Rachel Torres MRN: [id number] Date of Birth: June 02, 1956 Referring Provider:     Pulmonary Rehab from 10/09/2019 in [facility] Cardiac and Pulmonary Rehab  Referring Provider  Gollan      Encounter Date: 10/15/2019  Check In: Session Check In - 10/15/19 0908      Check-In   Supervising physician immediately available to respond to emergencies  See telemetry face sheet for immediately available ER MD    Location  [facility]-Cardiac & Pulmonary Rehab    Staff Present  Rachel Lark, RN, BSN, CCRP;Rachel Torres RCP,RRT,BSRT;Rachel Torres, [place], ACSM CEP, Exercise Physiologist    Virtual Visit  No    Medication changes reported      No    Fall or balance concerns reported     No    Warm-up and Cool-down  Performed on first and last piece of equipment    Resistance Training Performed  Yes    VAD Patient?  No    PAD/SET Patient?  No      Pain Assessment   Currently in Pain?  No/denies          Social History   Tobacco Use  Smoking Status Former Smoker  . Packs/day: 0.75  . Years: 1.50  . Pack years: 1.12  . Types: Cigarettes  . Quit date: 12/20/1978  . Years since quitting: 40.8  Smokeless Tobacco Never Used    Goals Met:  Independence with exercise equipment Exercise tolerated well  Goals Unmet:  Not Applicable  Comments: Pt able to follow exercise prescription today without complaint.  Will continue to monitor for progression.  Angina in chest to neck referring. Slowing down resolved symptoms. Advised Rachel Torres to work at level below level that caused symptoms.  Dr. Emily Torres is Medical Director for [name] and [name].

## 2019-10-17 ENCOUNTER — Encounter: Payer: Medicare HMO | Admitting: *Deleted

## 2019-10-17 ENCOUNTER — Other Ambulatory Visit: Payer: Self-pay

## 2019-10-17 DIAGNOSIS — I5032 Chronic diastolic (congestive) heart failure: Secondary | ICD-10-CM

## 2019-10-17 NOTE — Progress Notes (Signed)
Daily Session Note  Patient Details  Name: Rachel Torres MRN: [id number] Date of Birth: 04/06/1956 Referring Provider:     Pulmonary Rehab from 10/09/2019 in [facility] Cardiac and Pulmonary Rehab  Referring Provider  Gollan      Encounter Date: 10/17/2019  Check In: Session Check In - 10/17/19 0931      Check-In   Supervising physician immediately available to respond to emergencies  See telemetry face sheet for immediately available ER MD    Location  [facility]-Cardiac & Pulmonary Rehab    Staff Present  Susanne Bice, RN, BSN, CCRP;Jeanna Durrell BS, Exercise Physiologist;Joseph Hood RCP,RRT,BSRT    Virtual Visit  No    Medication changes reported      No    Fall or balance concerns reported     No    Warm-up and Cool-down  Performed on first and last piece of equipment    Resistance Training Performed  Yes    VAD Patient?  No    PAD/SET Patient?  No      Pain Assessment   Currently in Pain?  No/denies          Social History   Tobacco Use  Smoking Status Former Smoker  . Packs/day: 0.75  . Years: 1.50  . Pack years: 1.12  . Types: Cigarettes  . Quit date: 12/20/1978  . Years since quitting: 40.8  Smokeless Tobacco Never Used    Goals Met:  Independence with exercise equipment Exercise tolerated well No report of cardiac concerns or symptoms  Goals Unmet:  Not Applicable  Comments: Pt able to follow exercise prescription today without complaint.  Will continue to monitor for progression.    Dr. Mark Miller is Medical Director for [facility] and [name]. 

## 2019-10-18 ENCOUNTER — Encounter: Payer: Medicare HMO | Admitting: *Deleted

## 2019-10-18 DIAGNOSIS — I5032 Chronic diastolic (congestive) heart failure: Secondary | ICD-10-CM | POA: Diagnosis not present

## 2019-10-18 NOTE — Progress Notes (Signed)
Daily Session Note  Patient Details  Name: Rachel Torres MRN: [id number] Date of Birth: [birth year] Referring Provider:     Pulmonary Rehab from 10/09/2019 in [facility] Card[facility] Pulmonary Rehab  Referring Provider  Gollan      Encounter Date: 10/18/2019  Check In: Session Check In - 10/18/19 0746      Check-In   Supervising physician immediately available to respond to emergencies  See telemetry face sheet for immediately available ER MD    Location  [facility]-Cardiac & Pulmonary Rehab    Staff Present  Heath Lark, RN, BSN, CCRP;Amanda Sommer, BA, ACSM CEP, Exercise Physiologist;Jessica [place], MA, RCEP, CCRP, CCET    Virtual Visit  No    Medication changes reported      No    Fall or balance concerns reported     No    Warm-up and Cool-down  Performed on first and last piece of equipment    Resistance Training Performed  Yes    VAD Patient?  No    PAD/SET Patient?  No      Pain Assessment   Currently in Pain?  No/denies          Social History   Tobacco Use  Smoking Status Former Smoker  . Packs/day: 0.75  . Years: 1.50  . Pack years: 1.12  . Types: Cigarettes  . Quit date: 12/20/1978  . Years since quitting: 40.8  Smokeless Tobacco Never Used    Goals Met:  Independence with exercise equipment Exercise tolerated well No report of cardiac concerns or symptoms  Goals Unmet:  Not Applicable  Comments: Pt able to follow exercise prescription today without complaint.  Will continue to monitor for progression. Reviewed home exercise with pt today.  Pt plans to use videos/dance for exercise.  Reviewed THR, pulse, RPE, sign and symptoms, NTG use, and when to call 911 or MD.  Also discussed weather considerations and indoor options.  Pt voiced understanding.     Dr. Emily Filbert is Medical Director for [place] and [name].

## 2019-10-22 ENCOUNTER — Ambulatory Visit: Payer: Medicare HMO | Admitting: Internal Medicine

## 2019-10-24 ENCOUNTER — Ambulatory Visit (INDEPENDENT_AMBULATORY_CARE_PROVIDER_SITE_OTHER): Payer: Medicare HMO | Admitting: Psychology

## 2019-10-24 ENCOUNTER — Encounter: Payer: Self-pay | Admitting: *Deleted

## 2019-10-24 DIAGNOSIS — F4323 Adjustment disorder with mixed anxiety and depressed mood: Secondary | ICD-10-CM | POA: Diagnosis not present

## 2019-10-24 DIAGNOSIS — I5032 Chronic diastolic (congestive) heart failure: Secondary | ICD-10-CM

## 2019-10-24 NOTE — Progress Notes (Signed)
Pulmonary Individual Treatment Plan  Patient Details  Name: Rachel Torres MRN: [id number] Date of Birth: January 06, 1956 Referring Provider:     Pulmonary Rehab from 10/09/2019 in [facility]  Referring Provider  Gollan      Initial Encounter Date:    Pulmonary Rehab from 10/09/2019 in [facility] Cardiac and Pulmonary Rehab  Date  10/09/19      Visit Diagnosis: Heart failure, diastolic, chronic ([place])  Patient's Home Medications on Admission:  Current Outpatient Medications:  .  albuterol (PROVENTIL HFA;VENTOLIN HFA) 108 (90 Base) MCG/ACT inhaler, Inhale 1-2 puffs into the lungs every 6 (six) hours as needed for wheezing or shortness of breath., Disp: 1 Inhaler, Rfl: 0 .  ALPRAZolam (XANAX) 0.5 MG tablet, Take 1 tablet (0.5 mg total) by mouth daily as needed for anxiety., Disp: 30 tablet, Rfl: 0 .  aspirin EC 81 MG tablet, Take 81 mg by mouth at bedtime., Disp: , Rfl:  .  atorvastatin (LIPITOR) 20 MG tablet, TAKE 1 TABLET EVERY DAY, Disp: 90 tablet, Rfl: 3 .  carvedilol (COREG) 6.25 MG tablet, TAKE 1 TABLET TWICE DAILY WITH MEALS, Disp: 180 tablet, Rfl: 0 .  Cholecalciferol (VITAMIN D3) 5000 units CAPS, Take 1 capsule by mouth daily., Disp: , Rfl:  .  cyclobenzaprine (FLEXERIL) 10 MG tablet, Take 0.5-1 tablets (5-10 mg total) by mouth 3 (three) times daily as needed. For headache, Disp: 90 tablet, Rfl: 1 .  furosemide (LASIX) 20 MG tablet, TAKE 1 TABLET (20 MG TOTAL) BY MOUTH DAILY. NEED MD APPOINTMENT, Disp: 90 tablet, Rfl: 3 .  Krill Oil (OMEGA-3) 500 MG CAPS, Take 1 capsule by mouth daily., Disp: , Rfl:  .  lisinopril (ZESTRIL) 5 MG tablet, TAKE 1/2 TABLET EVERY DAY, Disp: 45 tablet, Rfl: 2 .  magnesium gluconate (MAGONATE) 500 MG tablet, Take 500 mg by mouth at bedtime., Disp: , Rfl:  .  Misc Natural Products (TART CHERRY ADVANCED) CAPS, Take 1 capsule by mouth daily., Disp: , Rfl:  .  naproxen sodium (ANAPROX) 220 MG tablet, Take 440 mg by mouth 2 (two) times daily  as needed (for headaches/pain)., Disp: , Rfl:  .  nitroGLYCERIN (NITROSTAT) 0.4 MG SL tablet, Place 1 tablet (0.4 mg total) under the tongue every 5 (five) minutes as needed for chest pain., Disp: 25 tablet, Rfl: 3 .  omeprazole (PRILOSEC) 40 MG capsule, TAKE 1 CAPSULE EVERY DAY, Disp: 90 capsule, Rfl: 1 .  potassium chloride (K-DUR) 10 MEQ tablet, Take 1 tablet (10 mEq total) by mouth 2 (two) times daily as needed., Disp: 180 tablet, Rfl: 3 .  promethazine (PHENERGAN) 25 MG tablet, Take 0.5 tablets (12.5 mg total) by mouth every 8 (eight) hours as needed for nausea., Disp: 90 tablet, Rfl: 1 .  venlafaxine XR (EFFEXOR-XR) 150 MG 24 hr capsule, TAKE 1 CAPSULE EVERY DAY WITH BREAKFAST, Disp: 90 capsule, Rfl: 1  Past Medical History: Past Medical History:  Diagnosis Date  . AAA (abdominal aortic aneurysm) ([name])    a. Chronic w/o evidence of aneurysmal dil on CTA 01/2016.  Marland Kitchen Allergy   . Anemia   . Anxiety   . CAD (coronary artery disease)    a. 08/2010 s/p CABG x 1 (VG->RCA) @ time of Ao dissection repair; b. 01/2016 Lexiscan MV: mid antsept/apical defect w/ ? peri-infarct ischemia-->likely attenuation-->Med Rx.  . Chronic combined systolic (congestive) and diastolic (congestive) heart failure ([place])    a. 2012 EF 30-35%; b. 04/2015 EF 40-45%; c. 01/2016 Echo: Ef 55-65%, nl  AoV bioprosthesis, nl RV, nl PASP.  Marland Kitchen Depression   . Gallbladder sludge   . GERD (gastroesophageal reflux disease)   . History of Bicuspid Aortic Valve    a. 08/2010 s/p AVR @ time of Ao dissection repair; b. 01/2016 Echo: Ef 55-65%, nl AoV bioprosthesis, nl RV, nl PASP.  Marland Kitchen Hx of repair of dissecting thoracic aortic aneurysm, [place] type A    a. 08/2010 s/p repair with AVR and VG->RCA; b. 01/2016 CTA: stable appearance of Asc Thoracic Aortic graft. Opacification of flase lumen of chronic abd Ao dissection w/ retrograde flow through lumbar arteries. No aneurysmal dil of flase lumen.  . Hyperlipidemia   . Hypertensive heart disease    . IBS (irritable bowel syndrome)   . Internal hemorrhoids   . Marfan syndrome    pt denies  . Migraine   . Obstructive sleep apnea   . Osteoporosis   . Pneumonia   . RA (rheumatoid arthritis) ([place])    a. Followed by Dr. [name]    Tobacco Use: Social History   Tobacco Use  Smoking Status Former Smoker  . Packs/day: 0.75  . Years: 1.50  . Pack years: 1.12  . Types: Cigarettes  . Quit date: 12/20/1978  . Years since quitting: 40.8  Smokeless Tobacco Never Used    Labs: Recent Chemical engineer    Labs for ITP Cardiac and Pulmonary Rehab Latest Ref Rng & Units 07/12/2016 11/23/2016 11/24/2017 06/26/2018 06/07/2019   Cholestrol 0 - 200 mg/dL - 132 193 166 137   LDLCALC 0 - 99 mg/dL - 69 114(H) 87 68   LDLDIRECT mg/dL - - - - -   HDL >39.00 mg/dL - 45.80 63.20 56.70 47.40   Trlycerides 0.0 - 149.0 mg/dL - 86.0 78.0 111.0 108.0   Hemoglobin A1c 4.6 - 6.5 % 5.3 5.5 6.0 6.1 6.0       Pulmonary Assessment Scores:   UCSD: Self-administered rating of dyspnea associated with activities of daily living (ADLs) 6-point scale (0 = "not at all" to 5 = "maximal or unable to do because of breathlessness")  Scoring Scores range from 0 to 120.  Minimally important difference is 5 units  CAT: CAT can identify the health impairment of COPD patients and is better correlated with disease progression.  CAT has a scoring range of zero to 40. The CAT score is classified into four groups of low (less than 10), medium (10 - 20), high (21-30) and very high (31-40) based on the impact level of disease on health status. A CAT score over 10 suggests significant symptoms.  A worsening CAT score could be explained by an exacerbation, poor medication adherence, poor inhaler technique, or progression of COPD or comorbid conditions.  CAT MCID is 2 points  mMRC: mMRC (Modified Medical Research Council) Dyspnea Scale is used to assess the degree of baseline functional disability in patients of respiratory  disease due to dyspnea. No minimal important difference is established. A decrease in score of 1 point or greater is considered a positive change.   Pulmonary Function Assessment:   Exercise Target Goals: Exercise Program Goal: Individual exercise prescription set using results from initial 6 min walk test and THRR while considering  patient's activity barriers and safety.   Exercise Prescription Goal: Initial exercise prescription builds to 30-45 minutes a day of aerobic activity, 2-3 days per week.  Home exercise guidelines will be given to patient during program as part of exercise prescription that the participant will acknowledge.  Activity  Barriers & Risk Stratification:   6 Minute Walk: 6 Minute Walk    Row Name 10/09/19 1314         6 Minute Walk   Phase  Initial     Distance  914 feet     Walk Time  6 minutes     # of Rest Breaks  0     MPH  1.73     METS  2.07     RPE  15     Perceived Dyspnea   3     VO2 Peak  7.2     Symptoms  Yes (comment)     Comments  Patient had some pain between shoulder blades that she attributed to wearing the wrong undergarments. Patient reported that she has had it before. Patient was informed that pain could be angina or sign/symptom of CAD in females. Patient will pay attention to when and what she is doing when pain occurs and if wearinf different more supportive sports bra does in fact make it better.     Resting HR  80 bpm     Resting BP  118/70     Resting Oxygen Saturation   95 %     Exercise Oxygen Saturation  during 6 min walk  94 %     Max Ex. HR  106 bpm     Max Ex. BP  148/80     2 Minute Post BP  118/80       Interval HR   1 Minute HR  99     2 Minute HR  106     3 Minute HR  101     4 Minute HR  102     5 Minute HR  100     6 Minute HR  103     2 Minute Post HR  91     Interval Heart Rate?  Yes       Interval Oxygen   Interval Oxygen?  Yes     Baseline Oxygen Saturation %  95 %     1 Minute Oxygen Saturation %   95 %     1 Minute Liters of Oxygen  0 L     2 Minute Oxygen Saturation %  94 %     2 Minute Liters of Oxygen  0 L     3 Minute Oxygen Saturation %  95 %     3 Minute Liters of Oxygen  0 L     4 Minute Oxygen Saturation %  94 %     4 Minute Liters of Oxygen  0 L     5 Minute Oxygen Saturation %  95 %     5 Minute Liters of Oxygen  0 L     6 Minute Oxygen Saturation %  94 %     6 Minute Liters of Oxygen  0 L     2 Minute Post Oxygen Saturation %  95 %     2 Minute Post Liters of Oxygen  0 L       Oxygen Initial Assessment: Oxygen Initial Assessment - 10/04/19 1444      Home Oxygen   Home Oxygen Device  None    Sleep Oxygen Prescription  None   patient did not tolerate CPAP   Home Exercise Oxygen Prescription  None    Home at Rest Exercise Oxygen Prescription  None    Compliance with Home Oxygen Use  No  does not use CPAP     Initial 6 min Walk   Oxygen Used  None      Program Oxygen Prescription   Program Oxygen Prescription  None      Intervention   Short Term Goals  To learn and demonstrate proper use of respiratory medications;To learn and demonstrate proper pursed lip breathing techniques or other breathing techniques.;To learn and understand importance of maintaining oxygen saturations>88%;To learn and understand importance of monitoring SPO2 with pulse oximeter and demonstrate accurate use of the pulse oximeter.    Long  Term Goals  Demonstrates proper use of MDI's;Compliance with respiratory medication;Exhibits proper breathing techniques, such as pursed lip breathing or other method taught during program session;Maintenance of O2 saturations>88%;Verbalizes importance of monitoring SPO2 with pulse oximeter and return demonstration       Oxygen Re-Evaluation:   Oxygen Discharge (Final Oxygen Re-Evaluation):   Initial Exercise Prescription: Initial Exercise Prescription - 10/09/19 1300      Date of Initial Exercise RX and Referring Provider   Date  10/09/19     Referring Provider  Gollan      Treadmill   MPH  1.2    Grade  0    Minutes  3    METs  2      NuStep   Level  1    SPM  80    Minutes  15    METs  2      Arm Ergometer   Level  1    Watts  15    RPM  20    Minutes  5    METs  1.5      T5 Nustep   Level  1    SPM  60    Minutes  15    METs  1.5      Biostep-RELP   Level  1    SPM  50    Minutes  15    METs  1      Prescription Details   Duration  Progress to 30 minutes of continuous aerobic without signs/symptoms of physical distress      Intensity   Ratings of Perceived Exertion  11-15    Perceived Dyspnea  0-4      Progression   Progression  Continue progressive overload as per policy without signs/symptoms or physical distress.      Resistance Training   Training Prescription  Yes    Weight  3    Reps  10-15       Perform Capillary Blood Glucose checks as needed.  Exercise Prescription Changes: Exercise Prescription Changes    Row Name 10/17/19 1300             Response to Exercise   Blood Pressure (Admit)  130/64       Blood Pressure (Exercise)  144/68       Blood Pressure (Exit)  132/70       Heart Rate (Admit)  86 bpm       Heart Rate (Exercise)  88 bpm       Heart Rate (Exit)  86 bpm       Oxygen Saturation (Admit)  93 %       Oxygen Saturation (Exercise)  96 %       Oxygen Saturation (Exit)  93 %       Rating of Perceived Exertion (Exercise)  15       Perceived Dyspnea (Exercise)  3  Symptoms  SOB       Duration  Continue with 30 min of aerobic exercise without signs/symptoms of physical distress.       Intensity  THRR unchanged         Progression   Progression  Continue to progress workloads to maintain intensity without signs/symptoms of physical distress.       Average METs  2.23         Resistance Training   Training Prescription  Yes       Weight  3 lbs       Reps  10-15         Interval Training   Interval Training  No         Arm Ergometer   Level  1        Minutes  15       METs  2.1         REL-XR   Level  1       Minutes  15       METs  2.9         T5 Nustep   Level  1       Minutes  15       METs  1.9         Biostep-RELP   Level  2       Minutes  15       METs  2          Exercise Comments: Exercise Comments    Row Name 10/10/19 0914 10/15/19 0924 10/18/19 0817       Exercise Comments  First full day of exercise!  Patient was oriented to gym and equipment including functions, settings, policies, and procedures.  Patient's individual exercise prescription and treatment plan were reviewed.  All starting workloads were established based on the results of the 6 minute walk test done at initial orientation visit.  The plan for exercise progression was also introduced and progression will be customized based on patient's performance and goals.  Angina in chest to neck referring. Slowing down resolved symptoms. Advised Rachel Torres to work at level below level that caused symptoms.  Reviewed THR, pulse, RPE, sign and symptoms, NTG use, and when to call 911 or MD.  Also discussed weather considerations and indoor options.  Pt voiced understanding.  Reviewed keeping intensity moderate and not doing high intensity due to connective tissue problems.        Exercise Goals and Review: Exercise Goals    Row Name 10/09/19 1337             Exercise Goals   Increase Physical Activity  Yes       Intervention  Provide advice, education, support and counseling about physical activity/exercise needs.;Develop an individualized exercise prescription for aerobic and resistive training based on initial evaluation findings, risk stratification, comorbidities and participant's personal goals.       Expected Outcomes  Short Term: Attend rehab on a regular basis to increase amount of physical activity.;Long Term: Add in home exercise to make exercise part of routine and to increase amount of physical activity.;Long Term: Exercising regularly at least 3-5 days  a week.       Increase Strength and Stamina  Yes       Intervention  Provide advice, education, support and counseling about physical activity/exercise needs.;Develop an individualized exercise prescription for aerobic and resistive training based on initial evaluation findings, risk stratification, comorbidities and participant's personal  goals.       Expected Outcomes  Short Term: Increase workloads from initial exercise prescription for resistance, speed, and METs.;Short Term: Perform resistance training exercises routinely during rehab and add in resistance training at home;Long Term: Improve cardiorespiratory fitness, muscular endurance and strength as measured by increased METs and functional capacity (6MWT)       Able to understand and use rate of perceived exertion (RPE) scale  Yes       Intervention  Provide education and explanation on how to use RPE scale       Expected Outcomes  Short Term: Able to use RPE daily in rehab to express subjective intensity level;Long Term:  Able to use RPE to guide intensity level when exercising independently       Able to understand and use Dyspnea scale  Yes       Intervention  Provide education and explanation on how to use Dyspnea scale       Expected Outcomes  Short Term: Able to use Dyspnea scale daily in rehab to express subjective sense of shortness of breath during exertion;Long Term: Able to use Dyspnea scale to guide intensity level when exercising independently       Knowledge and understanding of Target Heart Rate Range (THRR)  Yes       Intervention  Provide education and explanation of THRR including how the numbers were predicted and where they are located for reference       Expected Outcomes  Short Term: Able to state/look up THRR;Short Term: Able to use daily as guideline for intensity in rehab;Long Term: Able to use THRR to govern intensity when exercising independently       Able to check pulse independently  Yes       Intervention   Provide education and demonstration on how to check pulse in carotid and radial arteries.;Review the importance of being able to check your own pulse for safety during independent exercise       Expected Outcomes  Short Term: Able to explain why pulse checking is important during independent exercise;Long Term: Able to check pulse independently and accurately       Understanding of Exercise Prescription  Yes       Intervention  Provide education, explanation, and written materials on patient's individual exercise prescription       Expected Outcomes  Short Term: Able to explain program exercise prescription;Long Term: Able to explain home exercise prescription to exercise independently          Exercise Goals Re-Evaluation : Exercise Goals Re-Evaluation    Row Name 10/10/19 0914 10/17/19 1317 10/18/19 0818         Exercise Goal Re-Evaluation   Exercise Goals Review  Increase Physical Activity;Able to understand and use rate of perceived exertion (RPE) scale;Knowledge and understanding of Target Heart Rate Range (THRR);Understanding of Exercise Prescription;Increase Strength and Stamina;Able to understand and use Dyspnea scale  Increase Physical Activity;Increase Strength and Stamina;Understanding of Exercise Prescription  Increase Physical Activity;Increase Strength and Stamina;Able to understand and use rate of perceived exertion (RPE) scale;Knowledge and understanding of Target Heart Rate Range (THRR);Able to check pulse independently;Understanding of Exercise Prescription     Comments  Reviewed RPE scale, THR and program prescription with pt today.  Pt voiced understanding and was given a copy of goals to take home.  Rachel Torres is off to a good start in rehab.   She has completed her first four sessions. She is eager to improve. We will continue  to monitor her progress.  Reviewed THR, pulse, RPE, sign and symptoms, NTG use, and when to call 911 or MD.  Also discussed weather considerations and indoor  options.  Pt voiced understanding.     Expected Outcomes  Short: Use RPE daily to regulate intensity. Long: Follow program prescription in THR.  Short: Continue to attend regularly and review home exercise guidelines  Long: Continue to improve stamina.  Short - add 1-2 days of exercise at home Long - maintain exercise on her own        Discharge Exercise Prescription (Final Exercise Prescription Changes): Exercise Prescription Changes - 10/17/19 1300      Response to Exercise   Blood Pressure (Admit)  130/64    Blood Pressure (Exercise)  144/68    Blood Pressure (Exit)  132/70    Heart Rate (Admit)  86 bpm    Heart Rate (Exercise)  88 bpm    Heart Rate (Exit)  86 bpm    Oxygen Saturation (Admit)  93 %    Oxygen Saturation (Exercise)  96 %    Oxygen Saturation (Exit)  93 %    Rating of Perceived Exertion (Exercise)  15    Perceived Dyspnea (Exercise)  3    Symptoms  SOB    Duration  Continue with 30 min of aerobic exercise without signs/symptoms of physical distress.    Intensity  THRR unchanged      Progression   Progression  Continue to progress workloads to maintain intensity without signs/symptoms of physical distress.    Average METs  2.23      Resistance Training   Training Prescription  Yes    Weight  3 lbs    Reps  10-15      Interval Training   Interval Training  No      Arm Ergometer   Level  1    Minutes  15    METs  2.1      REL-XR   Level  1    Minutes  15    METs  2.9      T5 Nustep   Level  1    Minutes  15    METs  1.9      Biostep-RELP   Level  2    Minutes  15    METs  2       Nutrition:  Target Goals: Understanding of nutrition guidelines, daily intake of sodium <1570m, cholesterol <2080m calories 30% from fat and 7% or less from saturated fats, daily to have 5 or more servings of fruits and vegetables.  Biometrics: Pre Biometrics - 10/09/19 1319      Pre Biometrics   Height  _0  (1.676 m)    Weight  209 lb 12.8 oz (95.2 kg)     BMI (Calculated)  33.88        Nutrition Therapy Plan and Nutrition Goals:   Nutrition Assessments:   Nutrition Goals Re-Evaluation:   Nutrition Goals Discharge (Final Nutrition Goals Re-Evaluation):   Psychosocial: Target Goals: Acknowledge presence or absence of significant depression and/or stress, maximize coping skills, provide positive support system. Participant is able to verbalize types and ability to use techniques and skills needed for reducing stress and depression.   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 Her 87 year old mother lives with her.      Family Dynamics   Good Support System?  Yes    Comments  She looks to her paster and her therapist for support.      Barriers   Psychosocial barriers to participate in program  The patient should benefit from training in stress management and relaxation.;Psychosocial barriers identified (see note)      Screening Interventions   Interventions  Encouraged to exercise    Expected Outcomes  Long Term goal: The participant improves quality of Life and PHQ9 Scores as seen by post scores and/or verbalization of changes;Short Term goal: Identification and review with participant of any Quality of Life or Depression concerns found by scoring the questionnaire.;Long Term Goal: Stressors or current issues are controlled or eliminated.;Short Term goal: Utilizing psychosocial counselor, staff and physician to assist with identification of specific Stressors or current issues interfering with healing process. Setting desired goal for each stressor or current issue identified.       Quality of Life Scores:  Scores of 19 and below usually  indicate a poorer quality of life in these areas.  A difference of  2-3 points is a clinically meaningful difference.  A difference of 2-3 points in the total score of the Quality of Life Index has been associated with significant improvement in overall quality of life, self-image, physical symptoms, and general health in studies assessing change in quality of life.  PHQ-9: Recent Review Flowsheet Data    Depression screen [facility] 2/9 06/05/2019 12/07/2017 06/28/2017   Decreased Interest 1 0 1   Down, Depressed, Hopeless 1 0 0   PHQ - 2 Score 2 0 1   Altered sleeping 2 0 3   Tired, decreased energy 2 0 2   Change in appetite 2 0 3   Feeling bad or failure about yourself  0 0 1   Trouble concentrating 1 0 1   Moving slowly or fidgety/restless 0 0 1   Suicidal thoughts 0 0 0   PHQ-9 Score 9 0 12   Difficult doing work/chores Not difficult at all Not difficult at all -     Interpretation of Total Score  Total Score Depression Severity:  1-4 = Minimal depression, 5-9 = Mild depression, 10-14 = Moderate depression, 15-19 = Moderately severe depression, 20-27 = Severe depression   Psychosocial Evaluation and Intervention:   Psychosocial Re-Evaluation:   Psychosocial Discharge (Final Psychosocial Re-Evaluation):   Education: Education Goals: Education classes will be provided on a weekly basis, covering required topics. Participant will state understanding/return demonstration of topics presented.  Learning Barriers/Preferences: Learning Barriers/Preferences - 10/04/19 1441      Learning Barriers/Preferences   Learning Barriers  None    Learning Preferences  None       Education Topics:  Initial Evaluation Education: - Verbal, written and demonstration of respiratory meds, oximetry and breathing techniques. Instruction on use of nebulizers and MDIs and importance of monitoring MDI activations.   Pulmonary Rehab from 10/04/2019 in [facility] Cardiac and Pulmonary Rehab  Date  10/04/19   Educator  [facility]  Instruction Review Code  1- Verbalizes Understanding      General Nutrition Guidelines/Fats and Fiber: -Group instruction provided by verbal, written material, models and posters to present the general guidelines for heart healthy nutrition. Gives an explanation and review of dietary fats and fiber.   Controlling  Sodium/Reading Food Labels: -Group verbal and written material supporting the discussion of sodium use in heart healthy nutrition. Review and explanation with models, verbal and written materials for utilization of the food label.   Exercise Physiology & General Exercise Guidelines: - Group verbal and written instruction with models to review the exercise physiology of the cardiovascular system and associated critical values. Provides general exercise guidelines with specific guidelines to those with heart or lung disease.    Aerobic Exercise & Resistance Training: - Gives group verbal and written instruction on the various components of exercise. Focuses on aerobic and resistive training programs and the benefits of this training and how to safely progress through these programs.   Cardiac Rehab from 10/10/2019 in [facility] Cardiac and Pulmonary Rehab  Date  10/10/19  Educator  [place]  Instruction Review Code  1- Verbalizes Understanding      Flexibility, Balance, Mind/Body Relaxation: Provides group verbal/written instruction on the benefits of flexibility and balance training, including mind/body exercise modes such as yoga, pilates and tai chi.  Demonstration and skill practice provided.   Stress and Anxiety: - Provides group verbal and written instruction about the health risks of elevated stress and causes of high stress.  Discuss the correlation between heart/lung disease and anxiety and treatment options. Review healthy ways to manage with stress and anxiety.   Depression: - Provides group verbal and written instruction on the correlation between heart/lung  disease and depressed mood, treatment options, and the stigmas associated with seeking treatment.   Exercise & Equipment Safety: - Individual verbal instruction and demonstration of equipment use and safety with use of the equipment.   Cardiac Rehab from 10/10/2019 in [facility] Cardiac and Pulmonary Rehab  Date  10/09/19  Educator  [place]  Instruction Review Code  1- Verbalizes Understanding      Infection Prevention: - Provides verbal and written material to individual with discussion of infection control including proper hand washing and proper equipment cleaning during exercise session.   Cardiac Rehab from 10/10/2019 in [facility] Cardiac and Pulmonary Rehab  Date  10/09/19  Educator  [place]  Instruction Review Code  1- Verbalizes Understanding      Falls Prevention: - Provides verbal and written material to individual with discussion of falls prevention and safety.   Cardiac Rehab from 10/10/2019 in [facility] Cardiac and Pulmonary Rehab  Date  10/09/19  Educator  [place]  Instruction Review Code  1- Verbalizes Understanding      Diabetes: - Individual verbal and written instruction to review signs/symptoms of diabetes, desired ranges of glucose level fasting, after meals and with exercise. Advice that pre and post exercise glucose checks will be done for 3 sessions at entry of program.   Chronic Lung Diseases: - Group verbal and written instruction to review updates, respiratory medications, advancements in procedures and treatments. Discuss use of supplemental oxygen including available portable oxygen systems, continuous and intermittent flow rates, concentrators, personal use and safety guidelines. Review proper use of inhaler and spacers. Provide informative websites for self-education.    Energy Conservation: - Provide group verbal and written instruction for methods to conserve energy, plan and organize activities. Instruct on pacing techniques, use of adaptive equipment and posture/positioning  to relieve shortness of breath.   Triggers and Exacerbations: - Group verbal and written instruction to review types of environmental triggers and ways to prevent exacerbations. Discuss weather changes, air quality and the benefits of nasal washing. Review warning signs and symptoms to help prevent infections. Discuss techniques for effective  airway clearance, coughing, and vibrations.   AED/CPR: - Group verbal and written instruction with the use of models to demonstrate the basic use of the AED with the basic ABC's of resuscitation.   Anatomy and Physiology of the Lungs: - Group verbal and written instruction with the use of models to provide basic lung anatomy and physiology related to function, structure and complications of lung disease.   Anatomy & Physiology of the Heart: - Group verbal and written instruction and models provide basic cardiac anatomy and physiology, with the coronary electrical and arterial systems. Review of Valvular disease and Heart Failure   Cardiac Medications: - Group verbal and written instruction to review commonly prescribed medications for heart disease. Reviews the medication, class of the drug, and side effects.   Know Your Numbers and Risk Factors: -Group verbal and written instruction about important numbers in your health.  Discussion of what are risk factors and how they play a role in the disease process.  Review of Cholesterol, Blood Pressure, Diabetes, and BMI and the role they play in your overall health.   Cardiac Rehab from 10/10/2019 in [facility] Cardiac and Pulmonary Rehab  Date  10/10/19  Educator  [place]  Instruction Review Code  1- Verbalizes Understanding      Sleep Hygiene: -Provides group verbal and written instruction about how sleep can affect your health.  Define sleep hygiene, discuss sleep cycles and impact of sleep habits. Review good sleep hygiene tips.    Other: -Provides group and verbal instruction on various topics (see  comments)    Knowledge Questionnaire Score:    Core Components/Risk Factors/Patient Goals at Admission: Personal Goals and Risk Factors at Admission - 10/04/19 1444      Core Components/Risk Factors/Patient Goals on Admission    Weight Management  Obesity;Weight Loss    Improve shortness of breath with ADL's  Yes    Intervention  Provide education, individualized exercise plan and daily activity instruction to help decrease symptoms of SOB with activities of daily living.    Expected Outcomes  Short Term: Improve cardiorespiratory fitness to achieve a reduction of symptoms when performing ADLs;Long Term: Be able to perform more ADLs without symptoms or delay the onset of symptoms    Heart Failure  Yes    Intervention  Provide a combined exercise and nutrition program that is supplemented with education, support and counseling about heart failure. Directed toward relieving symptoms such as shortness of breath, decreased exercise tolerance, and extremity edema.    Expected Outcomes  Improve functional capacity of life;Short term: Attendance in program 2-3 days a week with increased exercise capacity. Reported lower sodium intake. Reported increased fruit and vegetable intake. Reports medication compliance.;Short term: Daily weights obtained and reported for increase. Utilizing diuretic protocols set by physician.;Long term: Adoption of self-care skills and reduction of barriers for early signs and symptoms recognition and intervention leading to self-care maintenance.    Hypertension  Yes    Intervention  Provide education on lifestyle modifcations including regular physical activity/exercise, weight management, moderate sodium restriction and increased consumption of fresh fruit, vegetables, and low fat dairy, alcohol moderation, and smoking cessation.;Monitor prescription use compliance.    Expected Outcomes  Short Term: Continued assessment and intervention until BP is < 140/54m HG in  hypertensive participants. < 130/848mHG in hypertensive participants with diabetes, heart failure or chronic kidney disease.;Long Term: Maintenance of blood pressure at goal levels.    Lipids  Yes    Intervention  Provide education and support  for participant on nutrition & aerobic/resistive exercise along with prescribed medications to achieve LDL <23m, HDL >464m    Expected Outcomes  Short Term: Participant states understanding of desired cholesterol values and is compliant with medications prescribed. Participant is following exercise prescription and nutrition guidelines.;Long Term: Cholesterol controlled with medications as prescribed, with individualized exercise RX and with personalized nutrition plan. Value goals: LDL < 7042mHDL > 40 mg.       Core Components/Risk Factors/Patient Goals Review:    Core Components/Risk Factors/Patient Goals at Discharge (Final Review):    ITP Comments: ITP Comments    Row Name 10/04/19 1451 10/04/19 1457 10/15/19 0924 10/24/19 0700     ITP Comments  Virtual Visit Completed.  -  Angina in chest to neck referring. Slowing down resolved symptoms. Advised Rachel Torres to work at level below level that caused symptoms.  30 day review completed. Continue with ITP sent to Dr. MarEmily Filbertedical Director of Cardiac and Pulmonary Rehab for review , changes as needed and signature.  Out for family medical concerns  Plans to return next week.       Comments:

## 2019-10-31 ENCOUNTER — Other Ambulatory Visit: Payer: Self-pay

## 2019-10-31 ENCOUNTER — Encounter: Payer: Medicare HMO | Attending: Cardiovascular Disease | Admitting: *Deleted

## 2019-10-31 DIAGNOSIS — J4520 Mild intermittent asthma, uncomplicated: Secondary | ICD-10-CM | POA: Diagnosis not present

## 2019-10-31 DIAGNOSIS — I5032 Chronic diastolic (congestive) heart failure: Secondary | ICD-10-CM

## 2019-10-31 DIAGNOSIS — J4541 Moderate persistent asthma with (acute) exacerbation: Secondary | ICD-10-CM | POA: Diagnosis not present

## 2019-10-31 NOTE — Progress Notes (Signed)
Daily Session Note  Patient Details  Name: Rachel Torres MRN: [id number] Date of Birth: [birth year] Referring Provider:     Pulmonary Rehab from 10/09/2019 in [facility] Cardiac and Pulmonary Rehab  Referring Provider  [place]      Encounter Date: 10/31/2019  Check In: Session Check In - 10/31/19 0910      Check-In   Supervising physician immediately available to respond to emergencies  See telemetry face sheet for immediately available ER MD    Location  [facility]-Cardiac & Pulmonary Rehab    Staff Present  Heath Lark, RN, BSN, CCRP;Jeanna Durrell BS, Exercise Physiologist;Joseph Hood RCP,RRT,BSRT    Virtual Visit  No    Medication changes reported      No    Fall or balance concerns reported     No    Warm-up and Cool-down  Performed on first and last piece of equipment    Resistance Training Performed  Yes    VAD Patient?  No    PAD/SET Patient?  No      Pain Assessment   Currently in Pain?  No/denies          Social History   Tobacco Use  Smoking Status Former Smoker  . Packs/day: 0.75  . Years: 1.50  . Pack years: 1.12  . Types: Cigarettes  . Quit date: 12/20/1978  . Years since quitting: 40.8  Smokeless Tobacco Never Used    Goals Met:  Independence with exercise equipment Exercise tolerated well No report of cardiac concerns or symptoms  Goals Unmet:  Not Applicable  Comments: Pt able to follow exercise prescription today without complaint.  Will continue to monitor for progression.    Dr. Emily Filbert is Medical Director for [place] and [name].

## 2019-11-02 ENCOUNTER — Encounter: Payer: Medicare HMO | Admitting: *Deleted

## 2019-11-02 ENCOUNTER — Other Ambulatory Visit: Payer: Self-pay | Admitting: Cardiovascular Disease

## 2019-11-02 ENCOUNTER — Other Ambulatory Visit: Payer: Self-pay

## 2019-11-02 DIAGNOSIS — I5032 Chronic diastolic (congestive) heart failure: Secondary | ICD-10-CM | POA: Diagnosis not present

## 2019-11-02 NOTE — Progress Notes (Signed)
Daily Session Note  Patient Details  Name: Rachel Torres MRN: [id number] Date of Birth: June 05, 1956 Referring Provider:     Pulmonary Rehab from 10/09/2019 in [facility] Cardiac and Pulmonary Rehab  Referring Provider  [place]      Encounter Date: 11/02/2019  Check In: Session Check In - 11/02/19 0908      Check-In   Supervising physician immediately available to respond to emergencies  See telemetry face sheet for immediately available ER MD    Location  [facility]-Cardiac & Pulmonary Rehab    Staff Present  Heath Lark, RN, BSN, CCRP;Jessica [place], [place], RCEP, CCRP, CCET;Joseph [place] RCP,RRT,BSRT    Virtual Visit  No    Medication changes reported      No    Fall or balance concerns reported     No    Warm-up and Cool-down  Performed on first and last piece of equipment    Resistance Training Performed  Yes    VAD Patient?  No    PAD/SET Patient?  No      Pain Assessment   Currently in Pain?  No/denies          Social History   Tobacco Use  Smoking Status Former Smoker  . Packs/day: 0.75  . Years: 1.50  . Pack years: 1.12  . Types: Cigarettes  . Quit date: 12/20/1978  . Years since quitting: 40.8  Smokeless Tobacco Never Used    Goals Met:  Independence with exercise equipment Exercise tolerated well No report of cardiac concerns or symptoms  Goals Unmet:  Not Applicable  Comments: Pt able to follow exercise prescription today without complaint.  Will continue to monitor for progression.    Dr. Emily Filbert is Medical Director for [place] and [name].

## 2019-11-05 ENCOUNTER — Encounter: Payer: Medicare HMO | Admitting: *Deleted

## 2019-11-05 ENCOUNTER — Other Ambulatory Visit: Payer: Self-pay

## 2019-11-05 DIAGNOSIS — I5032 Chronic diastolic (congestive) heart failure: Secondary | ICD-10-CM | POA: Diagnosis not present

## 2019-11-05 NOTE — Progress Notes (Signed)
Completed RD initial eval

## 2019-11-05 NOTE — Progress Notes (Signed)
Daily Session Note  Patient Details  Name: ELASHA TESS MRN: [id number] Date of Birth: Apr 02, 1956 Referring Provider:     Pulmonary Rehab from 10/09/2019 in [facility] Cardiac and Pulmonary Rehab  Referring Provider  [place]oll[place]      Encounter Date: 11/05/2019  Check In: Session Check In - 11/05/19 0915      Check-In   Supervising physician immediately available to respond to emergencies  See telemetry face sheet for immediately available ER MD    Location  [facility]-Cardiac & Pulmonary Rehab    Staff Present  Heath Lark, RN, BSN, CCRP;Jessica [place], MA, RCEP, CCRP, [place], BS, ACSM CEP, Exercise Physiologist    Virtual Visit  No    Medication changes reported      No    Fall or balance concerns reported     No    Warm-up and Cool-down  Performed on first and last piece of equipment    Resistance Training Performed  Yes    VAD Patient?  No    PAD/SET Patient?  No      Pain Assessment   Currently in Pain?  No/denies          Social History   Tobacco Use  Smoking Status Former Smoker  . Packs/day: 0.75  . Years: 1.50  . Pack years: 1.12  . Types: Cigarettes  . Quit date: 12/20/1978  . Years since quitting: 40.9  Smokeless Tobacco Never Used    Goals Met:  Independence with exercise equipment Exercise tolerated well No report of cardiac concerns or symptoms  Goals Unmet:  Not Applicable  Comments: Pt able to follow exercise prescription today without complaint.  Will continue to monitor for progression.    Dr. Emily Filbert is Medical Director for [place] and [name].

## 2019-11-07 ENCOUNTER — Ambulatory Visit (INDEPENDENT_AMBULATORY_CARE_PROVIDER_SITE_OTHER): Payer: Medicare HMO | Admitting: Psychology

## 2019-11-07 DIAGNOSIS — F4323 Adjustment disorder with mixed anxiety and depressed mood: Secondary | ICD-10-CM | POA: Diagnosis not present

## 2019-11-09 ENCOUNTER — Encounter: Payer: Medicare HMO | Admitting: *Deleted

## 2019-11-09 ENCOUNTER — Other Ambulatory Visit: Payer: Self-pay

## 2019-11-09 DIAGNOSIS — I5032 Chronic diastolic (congestive) heart failure: Secondary | ICD-10-CM

## 2019-11-09 NOTE — Progress Notes (Signed)
Daily Session Note  Patient Details  Name: Rachel Torres MRN: [id number] Date of Birth: 10-02-1956 Referring Provider:     Pulmonary Rehab from 10/09/2019 in [facility] Cardiac and Pulmonary Rehab  Referring Provider  [place]      Encounter Date: 11/09/2019  Check In: Session Check In - 11/09/19 0904      Check-In   Supervising physician immediately available to respond to emergencies  See telemetry face sheet for immediately available ER MD    Location  [facility]-Cardiac & Pulmonary Rehab    Staff Present  Heath Lark, RN, BSN, Lance Sell, BA, ACSM CEP, Exercise Physiologist    Virtual Visit  No    Medication changes reported      No    Fall or balance concerns reported     No    Warm-up and Cool-down  Performed on first and last piece of equipment    Resistance Training Performed  Yes    VAD Patient?  No    PAD/SET Patient?  No      Pain Assessment   Currently in Pain?  No/denies          Social History   Tobacco Use  Smoking Status Former Smoker  . Packs/day: 0.75  . Years: 1.50  . Pack years: 1.12  . Types: Cigarettes  . Quit date: 12/20/1978  . Years since quitting: 40.9  Smokeless Tobacco Never Used    Goals Met:  Independence with exercise equipment Exercise tolerated well No report of cardiac concerns or symptoms  Goals Unmet:  Not Applicable  Comments: Pt able to follow exercise prescription today without complaint.  Will continue to monitor for progression.    Dr. Emily Filbert is Medical Director for [place] and [name].

## 2019-11-12 ENCOUNTER — Encounter: Payer: Medicare HMO | Admitting: *Deleted

## 2019-11-12 ENCOUNTER — Other Ambulatory Visit: Payer: Self-pay

## 2019-11-12 DIAGNOSIS — I5032 Chronic diastolic (congestive) heart failure: Secondary | ICD-10-CM

## 2019-11-12 NOTE — Progress Notes (Signed)
Daily Session Note  Patient Details  Name: Rachel Torres MRN: [id number] Date of Birth: 08/15/1956 Referring Provider:     Pulmonary Rehab from 10/09/2019 in [facility] Cardiac and Pulmonary Rehab  Referring Provider  Gollan      Encounter Date: 11/12/2019  Check In: Session Check In - 11/12/19 0924      Check-In   Supervising physician immediately available to respond to emergencies  See telemetry face sheet for immediately available ER MD    Location  [facility]-Cardiac & Pulmonary Rehab    Staff Present  Susanne Bice, RN, BSN, CCRP;Jessica [place], MA, RCEP, CCRP, CCET;Kelly Hayes, BS, ACSM CEP, Exercise Physiologist    Virtual Visit  No    Medication changes reported      No    Fall or balance concerns reported     No    Warm-up and Cool-down  Performed on first and last piece of equipment    Resistance Training Performed  Yes    VAD Patient?  No    PAD/SET Patient?  No      Pain Assessment   Currently in Pain?  No/denies          Social History   Tobacco Use  Smoking Status Former Smoker  . Packs/day: 0.75  . Years: 1.50  . Pack years: 1.12  . Types: Cigarettes  . Quit date: 12/20/1978  . Years since quitting: 40.9  Smokeless Tobacco Never Used    Goals Met:  Proper associated with RPD/PD & O2 Sat Independence with exercise equipment Exercise tolerated well No report of cardiac concerns or symptoms  Goals Unmet:  Not Applicable  Comments: Pt able to follow exercise prescription today without complaint.  Will continue to monitor for progression.    Dr. Mark Miller is Medical Director for [facility] and [name]. 

## 2019-11-14 ENCOUNTER — Other Ambulatory Visit: Payer: Self-pay

## 2019-11-14 ENCOUNTER — Encounter: Payer: Medicare HMO | Admitting: *Deleted

## 2019-11-14 DIAGNOSIS — I5032 Chronic diastolic (congestive) heart failure: Secondary | ICD-10-CM | POA: Diagnosis not present

## 2019-11-14 NOTE — Progress Notes (Signed)
Daily Session Note  Patient Details  Name: Rachel Torres MRN: [id number] Date of Birth: [birth year] Referring Provider:     Pulmonary Rehab from 10/09/2019 in [facility] Cardiac and Pulmonary Rehab  Referring Provider  [place]oll[place]      Encounter Date: 11/14/2019  Check In: Session Check In - 11/14/19 0929      Check-In   Supervising physician immediately available to respond to emergencies  See telemetry face sheet for immediately available ER MD    Location  [facility]-Cardiac & Pulmonary Rehab    Staff Present  Heath Lark, RN, BSN, CCRP;Amanda Sommer, BA, ACSM CEP, Exercise Physiologist;Jeanna Durrell BS, Exercise Physiologist    Virtual Visit  No    Medication changes reported      No    Fall or balance concerns reported     No    Warm-up and Cool-down  Performed on first and last piece of equipment    Resistance Training Performed  Yes    VAD Patient?  No    PAD/SET Patient?  No      Pain Assessment   Currently in Pain?  No/denies          Social History   Tobacco Use  Smoking Status Former Smoker  . Packs/day: 0.75  . Years: 1.50  . Pack years: 1.12  . Types: Cigarettes  . Quit date: 12/20/1978  . Years since quitting: 40.9  Smokeless Tobacco Never Used    Goals Met:  Independence with exercise equipment Exercise tolerated well No report of cardiac concerns or symptoms  Goals Unmet:  Not Applicable  Comments: Pt able to follow exercise prescription today without complaint.  Will continue to monitor for progression.    Dr. Emily Filbert is Medical Director for [place] and [name].

## 2019-11-21 ENCOUNTER — Ambulatory Visit (INDEPENDENT_AMBULATORY_CARE_PROVIDER_SITE_OTHER): Payer: Medicare HMO | Admitting: Psychology

## 2019-11-21 ENCOUNTER — Encounter: Payer: Self-pay | Admitting: *Deleted

## 2019-11-21 DIAGNOSIS — F4323 Adjustment disorder with mixed anxiety and depressed mood: Secondary | ICD-10-CM

## 2019-11-21 DIAGNOSIS — I5032 Chronic diastolic (congestive) heart failure: Secondary | ICD-10-CM

## 2019-11-21 NOTE — Progress Notes (Signed)
Pulmonary Individual Treatment Plan  Patient Details  Name: Rachel Torres MRN: [id number] Date of Birth: January 06, 1956 Referring Provider:     Pulmonary Rehab from 10/09/2019 in [facility]  Referring Provider  Gollan      Initial Encounter Date:    Pulmonary Rehab from 10/09/2019 in [facility] Card[facility] Pulmonary Rehab  Date  10/09/19      Visit Diagnosis: Heart failure, diastolic, chronic ([place])  Patient's Home Medications on Admission:  Current Outpatient Medications:  .  albuterol (PROVENTIL HFA;VENTOLIN HFA) 108 (90 Base) MCG/ACT inhaler, Inhale 1-2 puffs into the lungs every 6 (six) hours as needed for wheezing or shortness of breath., Disp: 1 Inhaler, Rfl: 0 .  ALPRAZolam (XANAX) 0.5 MG tablet, Take 1 tablet (0.5 mg total) by mouth daily as needed for anxiety., Disp: 30 tablet, Rfl: 0 .  aspirin EC 81 MG tablet, Take 81 mg by mouth at bedtime., Disp: , Rfl:  .  atorvastatin (LIPITOR) 20 MG tablet, TAKE 1 TABLET EVERY DAY, Disp: 90 tablet, Rfl: 3 .  carvedilol (COREG) 6.25 MG tablet, TAKE 1 TABLET TWICE DAILY WITH MEALS, Disp: 180 tablet, Rfl: 0 .  Cholecalciferol (VITAMIN D3) 5000 units CAPS, Take 1 capsule by mouth daily., Disp: , Rfl:  .  cyclobenzaprine (FLEXERIL) 10 MG tablet, Take 0.5-1 tablets (5-10 mg total) by mouth 3 (three) times daily as needed. For headache, Disp: 90 tablet, Rfl: 1 .  furosemide (LASIX) 20 MG tablet, TAKE 1 TABLET (20 MG TOTAL) BY MOUTH DAILY. NEED MD APPOINTMENT, Disp: 90 tablet, Rfl: 3 .  Krill Oil (OMEGA-3) 500 MG CAPS, Take 1 capsule by mouth daily., Disp: , Rfl:  .  lisinopril (ZESTRIL) 5 MG tablet, TAKE 1/2 TABLET EVERY DAY, Disp: 45 tablet, Rfl: 2 .  magnesium gluconate (MAGONATE) 500 MG tablet, Take 500 mg by mouth at bedtime., Disp: , Rfl:  .  Misc Natural Products (TART CHERRY ADVANCED) CAPS, Take 1 capsule by mouth daily., Disp: , Rfl:  .  naproxen sodium (ANAPROX) 220 MG tablet, Take 440 mg by mouth 2 (two) times daily  as needed (for headaches/pain)., Disp: , Rfl:  .  nitroGLYCERIN (NITROSTAT) 0.4 MG SL tablet, Place 1 tablet (0.4 mg total) under the tongue every 5 (five) minutes as needed for chest pain., Disp: 25 tablet, Rfl: 3 .  omeprazole (PRILOSEC) 40 MG capsule, TAKE 1 CAPSULE EVERY DAY, Disp: 90 capsule, Rfl: 1 .  potassium chloride (K-DUR) 10 MEQ tablet, Take 1 tablet (10 mEq total) by mouth 2 (two) times daily as needed., Disp: 180 tablet, Rfl: 3 .  promethazine (PHENERGAN) 25 MG tablet, Take 0.5 tablets (12.5 mg total) by mouth every 8 (eight) hours as needed for nausea., Disp: 90 tablet, Rfl: 1 .  venlafaxine XR (EFFEXOR-XR) 150 MG 24 hr capsule, TAKE 1 CAPSULE EVERY DAY WITH BREAKFAST, Disp: 90 capsule, Rfl: 1  Past Medical History: Past Medical History:  Diagnosis Date  . AAA (abdominal aortic aneurysm) ([name])    a. Chronic w/o evidence of aneurysmal dil on CTA 01/2016.  Marland Kitchen Allergy   . Anemia   . Anxiety   . CAD (coronary artery disease)    a. 08/2010 s/p CABG x 1 (VG->RCA) @ time of Ao dissection repair; b. 01/2016 Lexiscan MV: mid antsept/apical defect w/ ? peri-infarct ischemia-->likely attenuation-->Med Rx.  . Chronic combined systolic (congestive) and diastolic (congestive) heart failure ([place])    a. 2012 EF 30-35%; b. 04/2015 EF 40-45%; c. 01/2016 Echo: Ef 55-65%, nl  AoV bioprosthesis, nl RV, nl PASP.  Marland Kitchen Depression   . Gallbladder sludge   . GERD (gastroesophageal reflux disease)   . History of Bicuspid Aortic Valve    a. 08/2010 s/p AVR @ time of Ao dissection repair; b. 01/2016 Echo: Ef 55-65%, nl AoV bioprosthesis, nl RV, nl PASP.  Marland Kitchen Hx of repair of dissecting thoracic aortic aneurysm, [place] type A    a. 08/2010 s/p repair with AVR and VG->RCA; b. 01/2016 CTA: stable appearance of Asc Thoracic Aortic graft. Opacification of flase lumen of chronic abd Ao dissection w/ retrograde flow through lumbar arteries. No aneurysmal dil of flase lumen.  . Hyperlipidemia   . Hypertensive heart disease    . IBS (irritable bowel syndrome)   . Internal hemorrhoids   . Marfan syndrome    pt denies  . Migraine   . Obstructive sleep apnea   . Osteoporosis   . Pneumonia   . RA (rheumatoid arthritis) ([place])    a. Followed by Dr. [name]    Tobacco Use: Social History   Tobacco Use  Smoking Status Former Smoker  . Packs/day: 0.75  . Years: 1.50  . Pack years: 1.12  . Types: Cigarettes  . Quit date: 12/20/1978  . Years since quitting: 40.9  Smokeless Tobacco Never Used    Labs: Recent Chemical engineer    Labs for ITP Cardiac and Pulmonary Rehab Latest Ref Rng & Units 07/12/2016 11/23/2016 11/24/2017 06/26/2018 06/07/2019   Cholestrol 0 - 200 mg/dL - 132 193 166 137   LDLCALC 0 - 99 mg/dL - 69 114(H) 87 68   LDLDIRECT mg/dL - - - - -   HDL >39.00 mg/dL - 45.80 63.20 56.70 47.40   Trlycerides 0.0 - 149.0 mg/dL - 86.0 78.0 111.0 108.0   Hemoglobin A1c 4.6 - 6.5 % 5.3 5.5 6.0 6.1 6.0       Pulmonary Assessment Scores:   UCSD: Self-administered rating of dyspnea associated with activities of daily living (ADLs) 6-point scale (0 = "not at all" to 5 = "maximal or unable to do because of breathlessness")  Scoring Scores range from 0 to 120.  Minimally important difference is 5 units  CAT: CAT can identify the health impairment of COPD patients and is better correlated with disease progression.  CAT has a scoring range of zero to 40. The CAT score is classified into four groups of low (less than 10), medium (10 - 20), high (21-30) and very high (31-40) based on the impact level of disease on health status. A CAT score over 10 suggests significant symptoms.  A worsening CAT score could be explained by an exacerbation, poor medication adherence, poor inhaler technique, or progression of COPD or comorbid conditions.  CAT MCID is 2 points  mMRC: mMRC (Modified Medical Research Council) Dyspnea Scale is used to assess the degree of baseline functional disability in patients of respiratory  disease due to dyspnea. No minimal important difference is established. A decrease in score of 1 point or greater is considered a positive change.   Pulmonary Function Assessment:   Exercise Target Goals: Exercise Program Goal: Individual exercise prescription set using results from initial 6 min walk test and THRR while considering  patient's activity barriers and safety.   Exercise Prescription Goal: Initial exercise prescription builds to 30-45 minutes a day of aerobic activity, 2-3 days per week.  Home exercise guidelines will be given to patient during program as part of exercise prescription that the participant will acknowledge.  Activity  Barriers & Risk Stratification:   6 Minute Walk: 6 Minute Walk    Row Name 10/09/19 1314         6 Minute Walk   Phase  Initial     Distance  914 feet     Walk Time  6 minutes     # of Rest Breaks  0     MPH  1.73     METS  2.07     RPE  15     Perceived Dyspnea   3     VO2 Peak  7.2     Symptoms  Yes (comment)     Comments  Patient had some pain between shoulder blades that she attributed to wearing the wrong undergarments. Patient reported that she has had it before. Patient was informed that pain could be angina or sign/symptom of CAD in females. Patient will pay attention to when and what she is doing when pain occurs and if wearinf different more supportive sports bra does in fact make it better.     Resting HR  80 bpm     Resting BP  118/70     Resting Oxygen Saturation   95 %     Exercise Oxygen Saturation  during 6 min walk  94 %     Max Ex. HR  106 bpm     Max Ex. BP  148/80     2 Minute Post BP  118/80       Interval HR   1 Minute HR  99     2 Minute HR  106     3 Minute HR  101     4 Minute HR  102     5 Minute HR  100     6 Minute HR  103     2 Minute Post HR  91     Interval Heart Rate?  Yes       Interval Oxygen   Interval Oxygen?  Yes     Baseline Oxygen Saturation %  95 %     1 Minute Oxygen Saturation %   95 %     1 Minute Liters of Oxygen  0 L     2 Minute Oxygen Saturation %  94 %     2 Minute Liters of Oxygen  0 L     3 Minute Oxygen Saturation %  95 %     3 Minute Liters of Oxygen  0 L     4 Minute Oxygen Saturation %  94 %     4 Minute Liters of Oxygen  0 L     5 Minute Oxygen Saturation %  95 %     5 Minute Liters of Oxygen  0 L     6 Minute Oxygen Saturation %  94 %     6 Minute Liters of Oxygen  0 L     2 Minute Post Oxygen Saturation %  95 %     2 Minute Post Liters of Oxygen  0 L       Oxygen Initial Assessment: Oxygen Initial Assessment - 10/04/19 1444      Home Oxygen   Home Oxygen Device  None    Sleep Oxygen Prescription  None   patient did not tolerate CPAP   Home Exercise Oxygen Prescription  None    Home at Rest Exercise Oxygen Prescription  None    Compliance with Home Oxygen Use  No  does not use CPAP     Initial 6 min Walk   Oxygen Used  None      Program Oxygen Prescription   Program Oxygen Prescription  None      Intervention   Short Term Goals  To learn and demonstrate proper use of respiratory medications;To learn and demonstrate proper pursed lip breathing techniques or other breathing techniques.;To learn and understand importance of maintaining oxygen saturations>88%;To learn and understand importance of monitoring SPO2 with pulse oximeter and demonstrate accurate use of the pulse oximeter.    Long  Term Goals  Demonstrates proper use of MDI's;Compliance with respiratory medication;Exhibits proper breathing techniques, such as pursed lip breathing or other method taught during program session;Maintenance of O2 saturations>88%;Verbalizes importance of monitoring SPO2 with pulse oximeter and return demonstration       Oxygen Re-Evaluation: Oxygen Re-Evaluation    Row Name 11/02/19 0917             Program Oxygen Prescription   Program Oxygen Prescription  None         Home Oxygen   Home Oxygen Device  None       Sleep Oxygen Prescription   None       Home Exercise Oxygen Prescription  None       Home at Rest Exercise Oxygen Prescription  None         Goals/Expected Outcomes   Short Term Goals  To learn and demonstrate proper use of respiratory medications;To learn and demonstrate proper pursed lip breathing techniques or other breathing techniques.;To learn and understand importance of maintaining oxygen saturations>88%;To learn and understand importance of monitoring SPO2 with pulse oximeter and demonstrate accurate use of the pulse oximeter.       Long  Term Goals  Demonstrates proper use of MDI's;Compliance with respiratory medication;Exhibits proper breathing techniques, such as pursed lip breathing or other method taught during program session;Maintenance of O2 saturations>88%;Verbalizes importance of monitoring SPO2 with pulse oximeter and return demonstration       Comments  Her saturations are doing well while she is awake but night time are a little low.  Overall, saturations are improving.  She wants to schedule an appointment with Dr. Mortimer Fries to talk about nighttime O2.  During the day she is maintaining above 95%. She has been using the PLB some and it can help.       Goals/Expected Outcomes  Short; Talk to doctor about saturations.  Long: Continue to use PLB.          Oxygen Discharge (Final Oxygen Re-Evaluation): Oxygen Re-Evaluation - 11/02/19 0917      Program Oxygen Prescription   Program Oxygen Prescription  None      Home Oxygen   Home Oxygen Device  None    Sleep Oxygen Prescription  None    Home Exercise Oxygen Prescription  None    Home at Rest Exercise Oxygen Prescription  None      Goals/Expected Outcomes   Short Term Goals  To learn and demonstrate proper use of respiratory medications;To learn and demonstrate proper pursed lip breathing techniques or other breathing techniques.;To learn and understand importance of maintaining oxygen saturations>88%;To learn and understand importance of monitoring SPO2  with pulse oximeter and demonstrate accurate use of the pulse oximeter.    Long  Term Goals  Demonstrates proper use of MDI's;Compliance with respiratory medication;Exhibits proper breathing techniques, such as pursed lip breathing or other method taught during program session;Maintenance of O2 saturations>88%;Verbalizes importance of monitoring SPO2  with pulse oximeter and return demonstration    Comments  Her saturations are doing well while she is awake but night time are a little low.  Overall, saturations are improving.  She wants to schedule an appointment with Dr. Mortimer Fries to talk about nighttime O2.  During the day she is maintaining above 95%. She has been using the PLB some and it can help.    Goals/Expected Outcomes  Short; Talk to doctor about saturations.  Long: Continue to use PLB.       Initial Exercise Prescription: Initial Exercise Prescription - 10/09/19 1300      Date of Initial Exercise RX and Referring Provider   Date  10/09/19    Referring Provider  Gollan      Treadmill   MPH  1.2    Grade  0    Minutes  3    METs  2      NuStep   Level  1    SPM  80    Minutes  15    METs  2      Arm Ergometer   Level  1    Watts  15    RPM  20    Minutes  5    METs  1.5      T5 Nustep   Level  1    SPM  60    Minutes  15    METs  1.5      Biostep-RELP   Level  1    SPM  50    Minutes  15    METs  1      Prescription Details   Duration  Progress to 30 minutes of continuous aerobic without signs/symptoms of physical distress      Intensity   Ratings of Perceived Exertion  11-15    Perceived Dyspnea  0-4      Progression   Progression  Continue progressive overload as per policy without signs/symptoms or physical distress.      Resistance Training   Training Prescription  Yes    Weight  3    Reps  10-15       Perform Capillary Blood Glucose checks as needed.  Exercise Prescription Changes: Exercise Prescription Changes    Row Name 10/17/19 1300  10/31/19 1200 11/12/19 0900         Response to Exercise   Blood Pressure (Admit)  130/64  128/70  134/70     Blood Pressure (Exercise)  144/68  152/80  128/76     Blood Pressure (Exit)  132/70  136/74  124/66     Heart Rate (Admit)  86 bpm  82 bpm  88 bpm     Heart Rate (Exercise)  88 bpm  101 bpm  102 bpm     Heart Rate (Exit)  86 bpm  88 bpm  80 bpm     Oxygen Saturation (Admit)  93 %  94 %  96 %     Oxygen Saturation (Exercise)  96 %  94 %  95 %     Oxygen Saturation (Exit)  93 %  95 %  96 %     Rating of Perceived Exertion (Exercise)  _0 Perceived Dyspnea (Exercise)  _1 Symptoms  SOB  SOB  SOB     Duration  Continue with 30 min of aerobic exercise without signs/symptoms of physical  distress.  Continue with 30 min of aerobic exercise without signs/symptoms of physical distress.  Continue with 30 min of aerobic exercise without signs/symptoms of physical distress.     Intensity  THRR unchanged  THRR unchanged  THRR unchanged       Progression   Progression  Continue to progress workloads to maintain intensity without signs/symptoms of physical distress.  Continue to progress workloads to maintain intensity without signs/symptoms of physical distress.  Continue to progress workloads to maintain intensity without signs/symptoms of physical distress.     Average METs  2.23  1.88  2.83       Resistance Training   Training Prescription  Yes  Yes  Yes     Weight  3 lbs  3 lb  3 lb     Reps  10-15  10-15  10-15       Interval Training   Interval Training  No  No  No       Treadmill   MPH  -  1.5  1.5     Grade  -  0  0     Minutes  -  15  15     METs  -  2.15  2.15       NuStep   Level  -  2  2     Minutes  -  15  15     METs  -  2.2  3.2       Arm Ergometer   Level  1  -  -     Minutes  15  -  -     METs  2.1  -  -       REL-XR   Level  _0 Minutes  _1 METs  2.9  1.3  3.8       T5 Nustep   Level  1  -  4     Minutes  15  -   15     METs  1.9  -  2.2       Biostep-RELP   Level  2  -  -     Minutes  15  -  -     METs  2  -  -       Home Exercise Plan   Plans to continue exercise at  -  Home (comment) walking  Home (comment) walking     Frequency  -  Add 2 additional days to program exercise sessions.  Add 2 additional days to program exercise sessions.     Initial Home Exercises Provided  -  10/18/19  10/18/19        Exercise Comments: Exercise Comments    Row Name 10/10/19 0914 10/15/19 0924 10/18/19 0817       Exercise Comments  First full day of exercise!  Patient was oriented to gym and equipment including functions, settings, policies, and procedures.  Patient's individual exercise prescription and treatment plan were reviewed.  All starting workloads were established based on the results of the 6 minute walk test done at initial orientation visit.  The plan for exercise progression was also introduced and progression will be customized based on patient's performance and goals.  Angina in chest to neck referring. Slowing down resolved symptoms. Advised Rachel Torres to work at level below level that caused symptoms.  Reviewed THR, pulse, RPE, sign and  symptoms, NTG use, and when to call 911 or MD.  Also discussed weather considerations and indoor options.  Pt voiced understanding.  Reviewed keeping intensity moderate and not doing high intensity due to connective tissue problems.        Exercise Goals and Review: Exercise Goals    Row Name 10/09/19 1337             Exercise Goals   Increase Physical Activity  Yes       Intervention  Provide advice, education, support and counseling about physical activity/exercise needs.;Develop an individualized exercise prescription for aerobic and resistive training based on initial evaluation findings, risk stratification, comorbidities and participant's personal goals.       Expected Outcomes  Short Term: Attend rehab on a regular basis to increase amount of physical  activity.;Long Term: Add in home exercise to make exercise part of routine and to increase amount of physical activity.;Long Term: Exercising regularly at least 3-5 days a week.       Increase Strength and Stamina  Yes       Intervention  Provide advice, education, support and counseling about physical activity/exercise needs.;Develop an individualized exercise prescription for aerobic and resistive training based on initial evaluation findings, risk stratification, comorbidities and participant's personal goals.       Expected Outcomes  Short Term: Increase workloads from initial exercise prescription for resistance, speed, and METs.;Short Term: Perform resistance training exercises routinely during rehab and add in resistance training at home;Long Term: Improve cardiorespiratory fitness, muscular endurance and strength as measured by increased METs and functional capacity (6MWT)       Able to understand and use rate of perceived exertion (RPE) scale  Yes       Intervention  Provide education and explanation on how to use RPE scale       Expected Outcomes  Short Term: Able to use RPE daily in rehab to express subjective intensity level;Long Term:  Able to use RPE to guide intensity level when exercising independently       Able to understand and use Dyspnea scale  Yes       Intervention  Provide education and explanation on how to use Dyspnea scale       Expected Outcomes  Short Term: Able to use Dyspnea scale daily in rehab to express subjective sense of shortness of breath during exertion;Long Term: Able to use Dyspnea scale to guide intensity level when exercising independently       Knowledge and understanding of Target Heart Rate Range (THRR)  Yes       Intervention  Provide education and explanation of THRR including how the numbers were predicted and where they are located for reference       Expected Outcomes  Short Term: Able to state/look up THRR;Short Term: Able to use daily as guideline for  intensity in rehab;Long Term: Able to use THRR to govern intensity when exercising independently       Able to check pulse independently  Yes       Intervention  Provide education and demonstration on how to check pulse in carotid and radial arteries.;Review the importance of being able to check your own pulse for safety during independent exercise       Expected Outcomes  Short Term: Able to explain why pulse checking is important during independent exercise;Long Term: Able to check pulse independently and accurately       Understanding of Exercise Prescription  Yes  Intervention  Provide education, explanation, and written materials on patient's individual exercise prescription       Expected Outcomes  Short Term: Able to explain program exercise prescription;Long Term: Able to explain home exercise prescription to exercise independently          Exercise Goals Re-Evaluation : Exercise Goals Re-Evaluation    Row Name 10/10/19 0914 10/17/19 1317 10/18/19 0818 10/31/19 1251 11/02/19 0910     Exercise Goal Re-Evaluation   Exercise Goals Review  Increase Physical Activity;Able to understand and use rate of perceived exertion (RPE) scale;Knowledge and understanding of Target Heart Rate Range (THRR);Understanding of Exercise Prescription;Increase Strength and Stamina;Able to understand and use Dyspnea scale  Increase Physical Activity;Increase Strength and Stamina;Understanding of Exercise Prescription  Increase Physical Activity;Increase Strength and Stamina;Able to understand and use rate of perceived exertion (RPE) scale;Knowledge and understanding of Target Heart Rate Range (THRR);Able to check pulse independently;Understanding of Exercise Prescription  Increase Physical Activity;Increase Strength and Stamina;Understanding of Exercise Prescription  Increase Physical Activity;Increase Strength and Stamina;Understanding of Exercise Prescription   Comments  Reviewed RPE scale, THR and program  prescription with pt today.  Pt voiced understanding and was given a copy of goals to take home.  Rachel Torres is off to a good start in rehab.   She has completed her first four sessions. She is eager to improve. We will continue to monitor her progress.  Reviewed THR, pulse, RPE, sign and symptoms, NTG use, and when to call 911 or MD.  Also discussed weather considerations and indoor options.  Pt voiced understanding.  Rachel Torres is doing well in rehab. She is up level 2 on NuStep and 1.5 mph on the treadmill.  We will continue to monitor her progress.  Rachel Torres has been out with her husband.  She has been walking a lot in the hospital with him there.  She has found some videos to use for exercise at home.  Her stamina is improving but not as quick as she would like.   Expected Outcomes  Short: Use RPE daily to regulate intensity. Long: Follow program prescription in THR.  Short: Continue to attend regularly and review home exercise guidelines  Long: Continue to improve stamina.  Short - add 1-2 days of exercise at home Long - maintain exercise on her own  Short: Work on continuous exercise on treadmill.  Long: Continue to add in exercise at home.  Short: Get back to normal exercise routine.  Long: Continue to exercise independently.   [place] Name 11/12/19 4709             Exercise Goal Re-Evaluation   Exercise Goals Review  Increase Physical Activity;Increase Strength and Stamina;Understanding of Exercise Prescription       Comments  Rachel Torres is doing well in rehab.  She now up to 3.8 METs on the XR. We will work with her on moving up her treadmill.  We will continue to monitor risk factors.       Expected Outcomes  Short: Increase treadmill  Long: Continue increase stamina.          Discharge Exercise Prescription (Final Exercise Prescription Changes): Exercise Prescription Changes - 11/12/19 0900      Response to Exercise   Blood Pressure (Admit)  134/70    Blood Pressure (Exercise)  128/76    Blood Pressure  (Exit)  124/66    Heart Rate (Admit)  88 bpm    Heart Rate (Exercise)  102 bpm    Heart Rate (Exit)  80  bpm    Oxygen Saturation (Admit)  96 %    Oxygen Saturation (Exercise)  95 %    Oxygen Saturation (Exit)  96 %    Rating of Perceived Exertion (Exercise)  13    Perceived Dyspnea (Exercise)  3    Symptoms  SOB    Duration  Continue with 30 min of aerobic exercise without signs/symptoms of physical distress.    Intensity  THRR unchanged      Progression   Progression  Continue to progress workloads to maintain intensity without signs/symptoms of physical distress.    Average METs  2.83      Resistance Training   Training Prescription  Yes    Weight  3 lb    Reps  10-15      Interval Training   Interval Training  No      Treadmill   MPH  1.5    Grade  0    Minutes  15    METs  2.15      NuStep   Level  2    Minutes  15    METs  3.2      REL-XR   Level  2    Minutes  15    METs  3.8      T5 Nustep   Level  4    Minutes  15    METs  2.2      Home Exercise Plan   Plans to continue exercise at  Home (comment)   walking   Frequency  Add 2 additional days to program exercise sessions.    Initial Home Exercises Provided  10/18/19       Nutrition:  Target Goals: Understanding of nutrition guidelines, daily intake of sodium <1512m, cholesterol <2028m calories 30% from fat and 7% or less from saturated fats, daily to have 5 or more servings of fruits and vegetables.  Biometrics: Pre Biometrics - 10/09/19 1319      Pre Biometrics   Height  _0  (1.676 m)    Weight  209 lb 12.8 oz (95.2 kg)    BMI (Calculated)  33.88        Nutrition Therapy Plan and Nutrition Goals: Nutrition Therapy & Goals - 11/05/19 1602      Nutrition Therapy   Diet  HH, low Na    Protein (specify units)  75g    Fiber  25 grams    Whole Grain Foods  3 servings    Saturated Fats  12 max. grams    Fruits and Vegetables  5 servings/day    Sodium  1.5 grams      Personal  Nutrition Goals   Nutrition Goal  ST: mindful eating, snacks LT: not get T2DM    Comments  Family hx of T2DM, pt has prediabetes. B: protein bar or oatmeal (instant, prefer it not to be instant), black coffee, yogurt (chobani), when husband is home will have a couple of slices of bacon and sometimes will make slow cooking grits with cheese (2-3x/month), will have eggs (2) 1x/week with bacon or sausage (over easy with butter or bacon drippings). will also drink water. Drinks: seltzer, unsweet tea, coffee. L: varies: fast food sandwich, pimento cheese, chicken salad, spam, will sometimes have salad. S: sometimes: yogurt or power bar. D: varies more with COVID. "not always eating vegetables", will use butter or Rachel oil, baking or pan frying or slow cooker. Pt reports liking vegetables. Chicken or beef; mostly chicken. Pt  reports eating too quickly. Pt reports peanuts triggers her migraines (actual peanut). Pt reports needs to eat more vegetables. Pt has hx of anorexia, discussed health outcomes, not weight loss. Will weigh pt backwards and have staff put in weight on machines. Discussed HH eating.      Intervention Plan   Intervention  Prescribe, educate and counsel regarding individualized specific dietary modifications aiming towards targeted core components such as weight, hypertension, lipid management, diabetes, heart failure and other comorbidities.;Nutrition handout(s) given to patient.    Expected Outcomes  Short Term Goal: Understand basic principles of dietary content, such as calories, fat, sodium, cholesterol and nutrients.;Short Term Goal: A plan has been developed with personal nutrition goals set during dietitian appointment.;Long Term Goal: Adherence to prescribed nutrition plan.       Nutrition Assessments:   Nutrition Goals Re-Evaluation:   Nutrition Goals Discharge (Final Nutrition Goals Re-Evaluation):   Psychosocial: Target Goals: Acknowledge presence or absence of significant  depression and/or stress, maximize coping skills, provide positive support system. Participant is able to verbalize types and ability to use techniques and skills needed for reducing stress and depression.   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 Her 46 year old mother lives with her.      Family Dynamics   Good Support System?  Yes    Comments  She looks to her paster and her therapist for support.      Barriers   Psychosocial barriers to participate in program  The patient should benefit from training in stress management and relaxation.;Psychosocial barriers identified (see note)      Screening Interventions   Interventions  Encouraged to exercise    Expected Outcomes  Long Term goal: The participant improves quality of Life and PHQ9 Scores as seen by post scores and/or verbalization of changes;Short Term goal: Identification and review with participant of any Quality of Life or Depression concerns found by scoring the questionnaire.;Long Term Goal: Stressors or current issues are controlled or eliminated.;Short Term goal: Utilizing psychosocial counselor, staff and physician to assist with identification of specific Stressors or current issues interfering with healing process. Setting desired goal for each stressor or current issue identified.       Quality of Life Scores:  Scores of 19 and below usually indicate a poorer quality of life in these areas.  A difference of  2-3 points is a clinically meaningful difference.  A difference of 2-3 points in the total score of the Quality of Life Index has been associated with significant improvement in overall quality of life, self-image, physical symptoms,  and general health in studies assessing change in quality of life.  PHQ-9: Recent Review Flowsheet Data    Depression screen [facility] 2/9 06/05/2019 12/07/2017 06/28/2017   Decreased Interest 1 0 1   Down, Depressed, Hopeless 1 0 0   PHQ - 2 Score 2 0 1   Altered sleeping 2 0 3   Tired, decreased energy 2 0 2   Change in appetite 2 0 3   Feeling bad or failure about yourself  0 0 1   Trouble concentrating 1 0 1   Moving slowly or fidgety/restless 0 0 1   Suicidal thoughts 0  0 0   PHQ-9 Score 9 0 12   Difficult doing work/chores Not difficult at all Not difficult at all -     Interpretation of Total Score  Total Score Depression Severity:  1-4 = Minimal depression, 5-9 = Mild depression, 10-14 = Moderate depression, 15-19 = Moderately severe depression, 20-27 = Severe depression   Psychosocial Evaluation and Intervention:   Psychosocial Re-Evaluation: Psychosocial Re-Evaluation    [place] Name 11/02/19 0913             Psychosocial Re-Evaluation   Current issues with  Current Stress Concerns       Comments  Rachel Torres has been under a lot of stress recently. Her husband fell and broke his hip.  She has been chasing him and following him around hospital and getting him in at home.  She has not broken down and minimal headache.       Expected Outcomes  Short: Remember to care for self.  Long: Continue to get back to normal.       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 Her 67 year old mother lives with her.         Initial Review   Source of Stress Concerns  Family          Psychosocial Discharge (Final Psychosocial Re-Evaluation): Psychosocial Re-Evaluation - 11/02/19 0913      Psychosocial Re-Evaluation   Current issues with  Current Stress Concerns    Comments  Rachel Torres has  been under a lot of stress recently. Her husband fell and broke his hip.  She has been chasing him and following him around hospital and getting him in at home.  She has not broken down and minimal headache.    Expected Outcomes  Short: Remember to care for self.  Long: Continue to get back to normal.    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 Her 48 year old mother lives with her.      Initial Review   Source of Stress Concerns  Family       Education: Education Goals: Education classes will be provided on a weekly basis, covering required topics. Participant will state understanding/return demonstration of topics presented.  Learning Barriers/Preferences: Learning Barriers/Preferences - 10/04/19 1441      Learning Barriers/Preferences   Learning Barriers  None    Learning Preferences  None       Education Topics:  Initial Evaluation Education: - Verbal, written and demonstration of respiratory meds, oximetry and breathing techniques. Instruction on use of nebulizers and MDIs and importance of monitoring MDI activations.   Pulmonary Rehab from 10/04/2019 in [facility] Cardiac and Pulmonary Rehab  Date  10/04/19  Educator  [facility]  Instruction Review Code  1- Verbalizes Understanding      General Nutrition Guidelines/Fats and Fiber: -Group instruction provided by verbal, written material, models and posters to present the general guidelines for heart healthy nutrition. Gives an explanation and review of dietary fats and fiber.   Controlling Sodium/Reading Food Labels: -Group  verbal and written material supporting the discussion of sodium use in heart healthy nutrition. Review and explanation with models, verbal and written materials for utilization of the food label.   Exercise  Physiology & General Exercise Guidelines: - Group verbal and written instruction with models to review the exercise physiology of the cardiovascular system and associated critical values. Provides general exercise guidelines with specific guidelines to those with heart or lung disease.    Aerobic Exercise & Resistance Training: - Gives group verbal and written instruction on the various components of exercise. Focuses on aerobic and resistive training programs and the benefits of this training and how to safely progress through these programs.   Cardiac Rehab from 10/10/2019 in [name] Cardiac and Pulmonary Rehab  Date  10/10/19  Educator  [place]  Instruction Review Code  1- Verbalizes Understanding      Flexibility, Balance, Mind/Body Relaxation: Provides group verbal/written instruction on the benefits of flexibility and balance training, including mind/body exercise modes such as yoga, pilates and tai chi.  Demonstration and skill practice provided.   Stress and Anxiety: - Provides group verbal and written instruction about the health risks of elevated stress and causes of high stress.  Discuss the correlation between heart/lung disease and anxiety and treatment options. Review healthy ways to manage with stress and anxiety.   Depression: - Provides group verbal and written instruction on the correlation between heart/lung disease and depressed mood, treatment options, and the stigmas associated with seeking treatment.   Exercise & Equipment Safety: - Individual verbal instruction and demonstration of equipment use and safety with use of the equipment.   Cardiac Rehab from 10/10/2019 in [facility] Cardiac and Pulmonary Rehab  Date  10/09/19  Educator  [place]  Instruction Review Code  1- Verbalizes Understanding      Infection Prevention: - Provides verbal and written material to individual with discussion of infection control including proper hand washing and proper equipment cleaning during  exercise session.   Cardiac Rehab from 10/10/2019 in [facility] Cardiac and Pulmonary Rehab  Date  10/09/19  Educator  [place]  Instruction Review Code  1- Verbalizes Understanding      Falls Prevention: - Provides verbal and written material to individual with discussion of falls prevention and safety.   Cardiac Rehab from 10/10/2019 in [facility] Cardiac and Pulmonary Rehab  Date  10/09/19  Educator  [place]  Instruction Review Code  1- Verbalizes Understanding      Diabetes: - Individual verbal and written instruction to review signs/symptoms of diabetes, desired ranges of glucose level fasting, after meals and with exercise. Advice that pre and post exercise glucose checks will be done for 3 sessions at entry of program.   Chronic Lung Diseases: - Group verbal and written instruction to review updates, respiratory medications, advancements in procedures and treatments. Discuss use of supplemental oxygen including available portable oxygen systems, continuous and intermittent flow rates, concentrators, personal use and safety guidelines. Review proper use of inhaler and spacers. Provide informative websites for self-education.    Energy Conservation: - Provide group verbal and written instruction for methods to conserve energy, plan and organize activities. Instruct on pacing techniques, use of adaptive equipment and posture/positioning to relieve shortness of breath.   Triggers and Exacerbations: - Group verbal and written instruction to review types of environmental triggers and ways to prevent exacerbations. Discuss weather changes, air quality and the benefits of nasal washing. Review warning signs and symptoms to help prevent infections. Discuss techniques for effective airway clearance, coughing, and  vibrations.   AED/CPR: - Group verbal and written instruction with the use of models to demonstrate the basic use of the AED with the basic ABC's of resuscitation.   Anatomy and Physiology of the  Lungs: - Group verbal and written instruction with the use of models to provide basic lung anatomy and physiology related to function, structure and complications of lung disease.   Anatomy & Physiology of the Heart: - Group verbal and written instruction and models provide basic cardiac anatomy and physiology, with the coronary electrical and arterial systems. Review of Valvular disease and Heart Failure   Cardiac Medications: - Group verbal and written instruction to review commonly prescribed medications for heart disease. Reviews the medication, class of the drug, and side effects.   Know Your Numbers and Risk Factors: -Group verbal and written instruction about important numbers in your health.  Discussion of what are risk factors and how they play a role in the disease process.  Review of Cholesterol, Blood Pressure, Diabetes, and BMI and the role they play in your overall health.   Cardiac Rehab from 10/10/2019 in [facility] Cardiac and Pulmonary Rehab  Date  10/10/19  Educator  [place]  Instruction Review Code  1- Verbalizes Understanding      Sleep Hygiene: -Provides group verbal and written instruction about how sleep can affect your health.  Define sleep hygiene, discuss sleep cycles and impact of sleep habits. Review good sleep hygiene tips.    Other: -Provides group and verbal instruction on various topics (see comments)    Knowledge Questionnaire Score:    Core Components/Risk Factors/Patient Goals at Admission: Personal Goals and Risk Factors at Admission - 10/04/19 1444      Core Components/Risk Factors/Patient Goals on Admission    Weight Management  Obesity;Weight Loss    Improve shortness of breath with ADL's  Yes    Intervention  Provide education, individualized exercise plan and daily activity instruction to help decrease symptoms of SOB with activities of daily living.    Expected Outcomes  Short Term: Improve cardiorespiratory fitness to achieve a reduction of  symptoms when performing ADLs;Long Term: Be able to perform more ADLs without symptoms or delay the onset of symptoms    Heart Failure  Yes    Intervention  Provide a combined exercise and nutrition program that is supplemented with education, support and counseling about heart failure. Directed toward relieving symptoms such as shortness of breath, decreased exercise tolerance, and extremity edema.    Expected Outcomes  Improve functional capacity of life;Short term: Attendance in program 2-3 days a week with increased exercise capacity. Reported lower sodium intake. Reported increased fruit and vegetable intake. Reports medication compliance.;Short term: Daily weights obtained and reported for increase. Utilizing diuretic protocols set by physician.;Long term: Adoption of self-care skills and reduction of barriers for early signs and symptoms recognition and intervention leading to self-care maintenance.    Hypertension  Yes    Intervention  Provide education on lifestyle modifcations including regular physical activity/exercise, weight management, moderate sodium restriction and increased consumption of fresh fruit, vegetables, and low fat dairy, alcohol moderation, and smoking cessation.;Monitor prescription use compliance.    Expected Outcomes  Short Term: Continued assessment and intervention until BP is < 140/16m HG in hypertensive participants. < 130/859mHG in hypertensive participants with diabetes, heart failure or chronic kidney disease.;Long Term: Maintenance of blood pressure at goal levels.    Lipids  Yes    Intervention  Provide education and support for participant on nutrition &  aerobic/resistive exercise along with prescribed medications to achieve LDL <44m, HDL >480m    Expected Outcomes  Short Term: Participant states understanding of desired cholesterol values and is compliant with medications prescribed. Participant is following exercise prescription and nutrition guidelines.;Long  Term: Cholesterol controlled with medications as prescribed, with individualized exercise RX and with personalized nutrition plan. Value goals: LDL < 7028mHDL > 40 mg.       Core Components/Risk Factors/Patient Goals Review:  Goals and Risk Factor Review    Row Name 11/02/19 0915             Core Components/Risk Factors/Patient Goals Review   Personal Goals Review  Weight Management/Obesity;Improve shortness of breath with ADL's;Heart Failure;Hypertension       Review  KanOlive Torres doing well at home.  Her weight has been going up with stress eating around her husband's fall.  She is breathing better and able to exercise and talk more easily now.  She is doing more around the house as well. She denies symptoms of heart failure currently.  She had one night of swelling but otherwise doing well.  Her breathing is doing okay.  Blood pressures are doing well at home too.       Expected Outcomes  Short: Back off stress eating to help with weight. Long; Continue to manage heart failure.          Core Components/Risk Factors/Patient Goals at Discharge (Final Review):  Goals and Risk Factor Review - 11/02/19 0915      Core Components/Risk Factors/Patient Goals Review   Personal Goals Review  Weight Management/Obesity;Improve shortness of breath with ADL's;Heart Failure;Hypertension    Review  KanOlive Torres doing well at home.  Her weight has been going up with stress eating around her husband's fall.  She is breathing better and able to exercise and talk more easily now.  She is doing more around the house as well. She denies symptoms of heart failure currently.  She had one night of swelling but otherwise doing well.  Her breathing is doing okay.  Blood pressures are doing well at home too.    Expected Outcomes  Short: Back off stress eating to help with weight. Long; Continue to manage heart failure.       ITP Comments: ITP Comments    Row Name 10/04/19 1451 10/04/19 1457 10/15/19 0924 10/24/19 0700  11/05/19 1646   ITP Comments  Virtual Visit Completed.  -  Angina in chest to neck referring. Slowing down resolved symptoms. Advised Rachel Torres to work at level below level that caused symptoms.  30 day review completed. Continue with ITP sent to Dr. MarEmily Filbertedical Director of Cardiac and Pulmonary Rehab for review , changes as needed and signature.  Out for family medical concerns  Plans to return next week.  Completed RD initial eval   Row Name 11/21/19 1041           ITP Comments  30 day review competed . ITP sent to Dr MarEmily Filbertr review, changes as needed and ITP approval signature.          Comments:

## 2019-11-26 ENCOUNTER — Encounter: Payer: Self-pay | Admitting: *Deleted

## 2019-11-26 DIAGNOSIS — I5032 Chronic diastolic (congestive) heart failure: Secondary | ICD-10-CM

## 2019-11-26 NOTE — Progress Notes (Signed)
Discharge Progress Report  Patient Details  Name: Rachel Torres MRN: [id number] Date of Birth: 21-Jan-1956 Referring Provider:     Pulmonary Rehab from 10/09/2019 in [facility]  Referring Provider  Gollan       Number of Visits: 12  Reason for Discharge:  Early Exit:  Personal  Smoking History:  Social History   Tobacco Use  Smoking Status Former Smoker  . Packs/day: 0.75  . Years: 1.50  . Pack years: 1.12  . Types: Cigarettes  . Quit date: 12/20/1978  . Years since quitting: 40.9  Smokeless Tobacco Never Used    Diagnosis:  Heart failure, diastolic, chronic (HCC)  ADL UCSD:   Initial Exercise Prescription: Initial Exercise Prescription - 10/09/19 1300      Date of Initial Exercise RX and Referring Provider   Date  10/09/19    Referring Provider  Gollan      Treadmill   MPH  1.2    Grade  0    Minutes  3    METs  2      NuStep   Level  1    SPM  80    Minutes  15    METs  2      Arm Ergometer   Level  1    Watts  15    RPM  20    Minutes  5    METs  1.5      T5 Nustep   Level  1    SPM  60    Minutes  15    METs  1.5      Biostep-RELP   Level  1    SPM  50    Minutes  15    METs  1      Prescription Details   Duration  Progress to 30 minutes of continuous aerobic without signs/symptoms of physical distress      Intensity   Ratings of Perceived Exertion  11-15    Perceived Dyspnea  0-4      Progression   Progression  Continue progressive overload as per policy without signs/symptoms or physical distress.      Resistance Training   Training Prescription  Yes    Weight  3    Reps  10-15       Discharge Exercise Prescription (Final Exercise Prescription Changes): Exercise Prescription Changes - 11/12/19 0900      Response to Exercise   Blood Pressure (Admit)  134/70    Blood Pressure (Exercise)  128/76    Blood Pressure (Exit)  124/66    Heart Rate (Admit)  88 bpm    Heart Rate (Exercise)  102 bpm     Heart Rate (Exit)  80 bpm    Oxygen Saturation (Admit)  96 %    Oxygen Saturation (Exercise)  95 %    Oxygen Saturation (Exit)  96 %    Rating of Perceived Exertion (Exercise)  13    Perceived Dyspnea (Exercise)  3    Symptoms  SOB    Duration  Continue with 30 min of aerobic exercise without signs/symptoms of physical distress.    Intensity  THRR unchanged      Progression   Progression  Continue to progress workloads to maintain intensity without signs/symptoms of physical distress.    Average METs  2.83      Resistance Training   Training Prescription  Yes    Weight  3 lb  Reps  10-15      Interval Training   Interval Training  No      Treadmill   MPH  1.5    Grade  0    Minutes  15    METs  2.15      NuStep   Level  2    Minutes  15    METs  3.2      REL-XR   Level  2    Minutes  15    METs  3.8      T5 Nustep   Level  4    Minutes  15    METs  2.2      Home Exercise Plan   Plans to continue exercise at  Home (comment)   walking   Frequency  Add 2 additional days to program exercise sessions.    Initial Home Exercises Provided  10/18/19       Functional Capacity: 6 Minute Walk    Row Name 10/09/19 1314         6 Minute Walk   Phase  Initial     Distance  914 feet     Walk Time  6 minutes     # of Rest Breaks  0     MPH  1.73     METS  2.07     RPE  15     Perceived Dyspnea   3     VO2 Peak  7.2     Symptoms  Yes (comment)     Comments  Patient had some pain between shoulder blades that she attributed to wearing the wrong undergarments. Patient reported that she has had it before. Patient was informed that pain could be angina or sign/symptom of CAD in females. Patient will pay attention to when and what she is doing when pain occurs and if wearinf different more supportive sports bra does in fact make it better.     Resting HR  80 bpm     Resting BP  118/70     Resting Oxygen Saturation   95 %     Exercise Oxygen Saturation  during 6  min walk  94 %     Max Ex. HR  106 bpm     Max Ex. BP  148/80     2 Minute Post BP  118/80       Interval HR   1 Minute HR  99     2 Minute HR  106     3 Minute HR  101     4 Minute HR  102     5 Minute HR  100     6 Minute HR  103     2 Minute Post HR  91     Interval Heart Rate?  Yes       Interval Oxygen   Interval Oxygen?  Yes     Baseline Oxygen Saturation %  95 %     1 Minute Oxygen Saturation %  95 %     1 Minute Liters of Oxygen  0 L     2 Minute Oxygen Saturation %  94 %     2 Minute Liters of Oxygen  0 L     3 Minute Oxygen Saturation %  95 %     3 Minute Liters of Oxygen  0 L     4 Minute Oxygen Saturation %  94 %  4 Minute Liters of Oxygen  0 L     5 Minute Oxygen Saturation %  95 %     5 Minute Liters of Oxygen  0 L     6 Minute Oxygen Saturation %  94 %     6 Minute Liters of Oxygen  0 L     2 Minute Post Oxygen Saturation %  95 %     2 Minute Post Liters of Oxygen  0 L        Psychological, QOL, Others - Outcomes: PHQ 2/9: Depression screen [facility] 2/9 06/05/2019 12/07/2017 06/28/2017  Decreased Interest 1 0 1  Down, Depressed, Hopeless 1 0 0  PHQ - 2 Score 2 0 1  Altered sleeping 2 0 3  Tired, decreased energy 2 0 2  Change in appetite 2 0 3  Feeling bad or failure about yourself  0 0 1  Trouble concentrating 1 0 1  Moving slowly or fidgety/restless 0 0 1  Suicidal thoughts 0 0 0  PHQ-9 Score 9 0 12  Difficult doing work/chores Not difficult at all Not difficult at all -  Some recent data might be hidden    Quality of Life:   Personal Goals: Goals established at orientation with interventions provided to work toward goal. Personal Goals and Risk Factors at Admission - 10/04/19 1444      Core Components/Risk Factors/Patient Goals on Admission    Weight Management  Obesity;Weight Loss    Improve shortness of breath with ADL's  Yes    Intervention  Provide education, individualized exercise plan and daily activity instruction to help decrease  symptoms of SOB with activities of daily living.    Expected Outcomes  Short Term: Improve cardiorespiratory fitness to achieve a reduction of symptoms when performing ADLs;Long Term: Be able to perform more ADLs without symptoms or delay the onset of symptoms    Heart Failure  Yes    Intervention  Provide a combined exercise and nutrition program that is supplemented with education, support and counseling about heart failure. Directed toward relieving symptoms such as shortness of breath, decreased exercise tolerance, and extremity edema.    Expected Outcomes  Improve functional capacity of life;Short term: Attendance in program 2-3 days a week with increased exercise capacity. Reported lower sodium intake. Reported increased fruit and vegetable intake. Reports medication compliance.;Short term: Daily weights obtained and reported for increase. Utilizing diuretic protocols set by physician.;Long term: Adoption of self-care skills and reduction of barriers for early signs and symptoms recognition and intervention leading to self-care maintenance.    Hypertension  Yes    Intervention  Provide education on lifestyle modifcations including regular physical activity/exercise, weight management, moderate sodium restriction and increased consumption of fresh fruit, vegetables, and low fat dairy, alcohol moderation, and smoking cessation.;Monitor prescription use compliance.    Expected Outcomes  Short Term: Continued assessment and intervention until BP is < 140/36mm HG in hypertensive participants. < 130/63mm HG in hypertensive participants with diabetes, heart failure or chronic kidney disease.;Long Term: Maintenance of blood pressure at goal levels.    Lipids  Yes    Intervention  Provide education and support for participant on nutrition & aerobic/resistive exercise along with prescribed medications to achieve LDL 70mg , HDL >40mg .    Expected Outcomes  Short Term: Participant states understanding of desired  cholesterol values and is compliant with medications prescribed. Participant is following exercise prescription and nutrition guidelines.;Long Term: Cholesterol controlled with medications as prescribed, with individualized exercise RX and  with personalized nutrition plan. Value goals: LDL < 70mg , HDL > 40 mg.        Personal Goals Discharge: Goals and Risk Factor Review    Row Name 11/02/19 0915             Core Components/Risk Factors/Patient Goals Review   Personal Goals Review  Weight Management/Obesity;Improve shortness of breath with ADL's;Heart Failure;Hypertension       Review  Olive Bass is doing well at home.  Her weight has been going up with stress eating around her husband's fall.  She is breathing better and able to exercise and talk more easily now.  She is doing more around the house as well. She denies symptoms of heart failure currently.  She had one night of swelling but otherwise doing well.  Her breathing is doing okay.  Blood pressures are doing well at home too.       Expected Outcomes  Short: Back off stress eating to help with weight. Long; Continue to manage heart failure.          Exercise Goals and Review: Exercise Goals    Row Name 10/09/19 1337             Exercise Goals   Increase Physical Activity  Yes       Intervention  Provide advice, education, support and counseling about physical activity/exercise needs.;Develop an individualized exercise prescription for aerobic and resistive training based on initial evaluation findings, risk stratification, comorbidities and participant's personal goals.       Expected Outcomes  Short Term: Attend rehab on a regular basis to increase amount of physical activity.;Long Term: Add in home exercise to make exercise part of routine and to increase amount of physical activity.;Long Term: Exercising regularly at least 3-5 days a week.       Increase Strength and Stamina  Yes       Intervention  Provide advice, education,  support and counseling about physical activity/exercise needs.;Develop an individualized exercise prescription for aerobic and resistive training based on initial evaluation findings, risk stratification, comorbidities and participant's personal goals.       Expected Outcomes  Short Term: Increase workloads from initial exercise prescription for resistance, speed, and METs.;Short Term: Perform resistance training exercises routinely during rehab and add in resistance training at home;Long Term: Improve cardiorespiratory fitness, muscular endurance and strength as measured by increased METs and functional capacity (6MWT)       Able to understand and use rate of perceived exertion (RPE) scale  Yes       Intervention  Provide education and explanation on how to use RPE scale       Expected Outcomes  Short Term: Able to use RPE daily in rehab to express subjective intensity level;Long Term:  Able to use RPE to guide intensity level when exercising independently       Able to understand and use Dyspnea scale  Yes       Intervention  Provide education and explanation on how to use Dyspnea scale       Expected Outcomes  Short Term: Able to use Dyspnea scale daily in rehab to express subjective sense of shortness of breath during exertion;Long Term: Able to use Dyspnea scale to guide intensity level when exercising independently       Knowledge and understanding of Target Heart Rate Range (THRR)  Yes       Intervention  Provide education and explanation of THRR including how the numbers were predicted and where  they are located for reference       Expected Outcomes  Short Term: Able to state/look up THRR;Short Term: Able to use daily as guideline for intensity in rehab;Long Term: Able to use THRR to govern intensity when exercising independently       Able to check pulse independently  Yes       Intervention  Provide education and demonstration on how to check pulse in carotid and radial arteries.;Review the  importance of being able to check your own pulse for safety during independent exercise       Expected Outcomes  Short Term: Able to explain why pulse checking is important during independent exercise;Long Term: Able to check pulse independently and accurately       Understanding of Exercise Prescription  Yes       Intervention  Provide education, explanation, and written materials on patient's individual exercise prescription       Expected Outcomes  Short Term: Able to explain program exercise prescription;Long Term: Able to explain home exercise prescription to exercise independently          Exercise Goals Re-Evaluation: Exercise Goals Re-Evaluation    Row Name 10/10/19 0914 10/17/19 1317 10/18/19 0818 10/31/19 1251 11/02/19 0910     Exercise Goal Re-Evaluation   Exercise Goals Review  Increase Physical Activity;Able to understand and use rate of perceived exertion (RPE) scale;Knowledge and understanding of Target Heart Rate Range (THRR);Understanding of Exercise Prescription;Increase Strength and Stamina;Able to understand and use Dyspnea scale  Increase Physical Activity;Increase Strength and Stamina;Understanding of Exercise Prescription  Increase Physical Activity;Increase Strength and Stamina;Able to understand and use rate of perceived exertion (RPE) scale;Knowledge and understanding of Target Heart Rate Range (THRR);Able to check pulse independently;Understanding of Exercise Prescription  Increase Physical Activity;Increase Strength and Stamina;Understanding of Exercise Prescription  Increase Physical Activity;Increase Strength and Stamina;Understanding of Exercise Prescription   Comments  Reviewed RPE scale, THR and program prescription with pt today.  Pt voiced understanding and was given a copy of goals to take home.  Olive Bass is off to a good start in rehab.   She has completed her first four sessions. She is eager to improve. We will continue to monitor her progress.  Reviewed THR, pulse,  RPE, sign and symptoms, NTG use, and when to call 911 or MD.  Also discussed weather considerations and indoor options.  Pt voiced understanding.  Olive Bass is doing well in rehab. She is up level 2 on NuStep and 1.5 mph on the treadmill.  We will continue to monitor her progress.  Olive Bass has been out with her husband.  She has been walking a lot in the hospital with him there.  She has found some videos to use for exercise at home.  Her stamina is improving but not as quick as she would like.   Expected Outcomes  Short: Use RPE daily to regulate intensity. Long: Follow program prescription in THR.  Short: Continue to attend regularly and review home exercise guidelines  Long: Continue to improve stamina.  Short - add 1-2 days of exercise at home Long - maintain exercise on her own  Short: Work on continuous exercise on treadmill.  Long: Continue to add in exercise at home.  Short: Get back to normal exercise routine.  Long: Continue to exercise independently.   [place] Name 11/12/19 [id number]             Exercise Goal Re-Evaluation   Exercise Goals Review  Increase Physical Activity;Increase Strength and Stamina;Understanding of  Exercise Prescription       Comments  Olive Bass is doing well in rehab.  She now up to 3.8 METs on the XR. We will work with her on moving up her treadmill.  We will continue to monitor risk factors.       Expected Outcomes  Short: Increase treadmill  Long: Continue increase stamina.          Nutrition & Weight - Outcomes: Pre Biometrics - 10/09/19 1319      Pre Biometrics   Height  5\' 6"  (1.676 m)    Weight  209 lb 12.8 oz (95.2 kg)    BMI (Calculated)  33.88        Nutrition: Nutrition Therapy & Goals - 11/05/19 1602      Nutrition Therapy   Diet  HH, low Na    Protein (specify units)  75g    Fiber  25 grams    Whole Grain Foods  3 servings    Saturated Fats  12 max. grams    Fruits and Vegetables  5 servings/day    Sodium  1.5 grams      Personal Nutrition Goals    Nutrition Goal  ST: mindful eating, snacks LT: not get T2DM    Comments  Family hx of T2DM, pt has prediabetes. B: protein bar or oatmeal (instant, prefer it not to be instant), black coffee, yogurt (chobani), when husband is home will have a couple of slices of bacon and sometimes will make slow cooking grits with cheese (2-3x/month), will have eggs (2) 1x/week with bacon or sausage (over easy with butter or bacon drippings). will also drink water. Drinks: seltzer, unsweet tea, coffee. L: varies: fast food sandwich, pimento cheese, chicken salad, spam, will sometimes have salad. S: sometimes: yogurt or power bar. D: varies more with COVID. "not always eating vegetables", will use butter or olive oil, baking or pan frying or slow cooker. Pt reports liking vegetables. Chicken or beef; mostly chicken. Pt reports eating too quickly. Pt reports peanuts triggers her migraines (actual peanut). Pt reports needs to eat more vegetables. Pt has hx of anorexia, discussed health outcomes, not weight loss. Will weigh pt backwards and have staff put in weight on machines. Discussed HH eating.      Intervention Plan   Intervention  Prescribe, educate and counsel regarding individualized specific dietary modifications aiming towards targeted core components such as weight, hypertension, lipid management, diabetes, heart failure and other comorbidities.;Nutrition handout(s) given to patient.    Expected Outcomes  Short Term Goal: Understand basic principles of dietary content, such as calories, fat, sodium, cholesterol and nutrients.;Short Term Goal: A plan has been developed with personal nutrition goals set during dietitian appointment.;Long Term Goal: Adherence to prescribed nutrition plan.       Nutrition Discharge:   Education Questionnaire Score:   Goals reviewed with patient; copy given to patient.

## 2019-11-26 NOTE — Progress Notes (Signed)
Pulmonary Individual Treatment Plan  Patient Details  Name: Rachel Torres MRN: [id number] Date of Birth: January 06, 1956 Referring Provider:     Pulmonary Rehab from 10/09/2019 in [facility]  Referring Provider  Gollan      Initial Encounter Date:    Pulmonary Rehab from 10/09/2019 in [facility] Card[facility] Pulmonary Rehab  Date  10/09/19      Visit Diagnosis: Heart failure, diastolic, chronic ([place])  Patient's Home Medications on Admission:  Current Outpatient Medications:  .  albuterol (PROVENTIL HFA;VENTOLIN HFA) 108 (90 Base) MCG/ACT inhaler, Inhale 1-2 puffs into the lungs every 6 (six) hours as needed for wheezing or shortness of breath., Disp: 1 Inhaler, Rfl: 0 .  ALPRAZolam (XANAX) 0.5 MG tablet, Take 1 tablet (0.5 mg total) by mouth daily as needed for anxiety., Disp: 30 tablet, Rfl: 0 .  aspirin EC 81 MG tablet, Take 81 mg by mouth at bedtime., Disp: , Rfl:  .  atorvastatin (LIPITOR) 20 MG tablet, TAKE 1 TABLET EVERY DAY, Disp: 90 tablet, Rfl: 3 .  carvedilol (COREG) 6.25 MG tablet, TAKE 1 TABLET TWICE DAILY WITH MEALS, Disp: 180 tablet, Rfl: 0 .  Cholecalciferol (VITAMIN D3) 5000 units CAPS, Take 1 capsule by mouth daily., Disp: , Rfl:  .  cyclobenzaprine (FLEXERIL) 10 MG tablet, Take 0.5-1 tablets (5-10 mg total) by mouth 3 (three) times daily as needed. For headache, Disp: 90 tablet, Rfl: 1 .  furosemide (LASIX) 20 MG tablet, TAKE 1 TABLET (20 MG TOTAL) BY MOUTH DAILY. NEED MD APPOINTMENT, Disp: 90 tablet, Rfl: 3 .  Krill Oil (OMEGA-3) 500 MG CAPS, Take 1 capsule by mouth daily., Disp: , Rfl:  .  lisinopril (ZESTRIL) 5 MG tablet, TAKE 1/2 TABLET EVERY DAY, Disp: 45 tablet, Rfl: 2 .  magnesium gluconate (MAGONATE) 500 MG tablet, Take 500 mg by mouth at bedtime., Disp: , Rfl:  .  Misc Natural Products (TART CHERRY ADVANCED) CAPS, Take 1 capsule by mouth daily., Disp: , Rfl:  .  naproxen sodium (ANAPROX) 220 MG tablet, Take 440 mg by mouth 2 (two) times daily  as needed (for headaches/pain)., Disp: , Rfl:  .  nitroGLYCERIN (NITROSTAT) 0.4 MG SL tablet, Place 1 tablet (0.4 mg total) under the tongue every 5 (five) minutes as needed for chest pain., Disp: 25 tablet, Rfl: 3 .  omeprazole (PRILOSEC) 40 MG capsule, TAKE 1 CAPSULE EVERY DAY, Disp: 90 capsule, Rfl: 1 .  potassium chloride (K-DUR) 10 MEQ tablet, Take 1 tablet (10 mEq total) by mouth 2 (two) times daily as needed., Disp: 180 tablet, Rfl: 3 .  promethazine (PHENERGAN) 25 MG tablet, Take 0.5 tablets (12.5 mg total) by mouth every 8 (eight) hours as needed for nausea., Disp: 90 tablet, Rfl: 1 .  venlafaxine XR (EFFEXOR-XR) 150 MG 24 hr capsule, TAKE 1 CAPSULE EVERY DAY WITH BREAKFAST, Disp: 90 capsule, Rfl: 1  Past Medical History: Past Medical History:  Diagnosis Date  . AAA (abdominal aortic aneurysm) ([name])    a. Chronic w/o evidence of aneurysmal dil on CTA 01/2016.  Marland Kitchen Allergy   . Anemia   . Anxiety   . CAD (coronary artery disease)    a. 08/2010 s/p CABG x 1 (VG->RCA) @ time of Ao dissection repair; b. 01/2016 Lexiscan MV: mid antsept/apical defect w/ ? peri-infarct ischemia-->likely attenuation-->Med Rx.  . Chronic combined systolic (congestive) and diastolic (congestive) heart failure ([place])    a. 2012 EF 30-35%; b. 04/2015 EF 40-45%; c. 01/2016 Echo: Ef 55-65%, nl  AoV bioprosthesis, nl RV, nl PASP.  Marland Kitchen Depression   . Gallbladder sludge   . GERD (gastroesophageal reflux disease)   . History of Bicuspid Aortic Valve    a. 08/2010 s/p AVR @ time of Ao dissection repair; b. 01/2016 Echo: Ef 55-65%, nl AoV bioprosthesis, nl RV, nl PASP.  Marland Kitchen Hx of repair of dissecting thoracic aortic aneurysm, [place] type A    a. 08/2010 s/p repair with AVR and VG->RCA; b. 01/2016 CTA: stable appearance of Asc Thoracic Aortic graft. Opacification of flase lumen of chronic abd Ao dissection w/ retrograde flow through lumbar arteries. No aneurysmal dil of flase lumen.  . Hyperlipidemia   . Hypertensive heart disease    . IBS (irritable bowel syndrome)   . Internal hemorrhoids   . Marfan syndrome    pt denies  . Migraine   . Obstructive sleep apnea   . Osteoporosis   . Pneumonia   . RA (rheumatoid arthritis) ([place])    a. Followed by Dr. [name]    Tobacco Use: Social History   Tobacco Use  Smoking Status Former Smoker  . Packs/day: 0.75  . Years: 1.50  . Pack years: 1.12  . Types: Cigarettes  . Quit date: 12/20/1978  . Years since quitting: 40.9  Smokeless Tobacco Never Used    Labs: Recent Chemical engineer    Labs for ITP Cardiac and Pulmonary Rehab Latest Ref Rng & Units 07/12/2016 11/23/2016 11/24/2017 06/26/2018 06/07/2019   Cholestrol 0 - 200 mg/dL - 132 193 166 137   LDLCALC 0 - 99 mg/dL - 69 114(H) 87 68   LDLDIRECT mg/dL - - - - -   HDL >39.00 mg/dL - 45.80 63.20 56.70 47.40   Trlycerides 0.0 - 149.0 mg/dL - 86.0 78.0 111.0 108.0   Hemoglobin A1c 4.6 - 6.5 % 5.3 5.5 6.0 6.1 6.0       Pulmonary Assessment Scores:   UCSD: Self-administered rating of dyspnea associated with activities of daily living (ADLs) 6-point scale (0 = "not at all" to 5 = "maximal or unable to do because of breathlessness")  Scoring Scores range from 0 to 120.  Minimally important difference is 5 units  CAT: CAT can identify the health impairment of COPD patients and is better correlated with disease progression.  CAT has a scoring range of zero to 40. The CAT score is classified into four groups of low (less than 10), medium (10 - 20), high (21-30) and very high (31-40) based on the impact level of disease on health status. A CAT score over 10 suggests significant symptoms.  A worsening CAT score could be explained by an exacerbation, poor medication adherence, poor inhaler technique, or progression of COPD or comorbid conditions.  CAT MCID is 2 points  mMRC: mMRC (Modified Medical Research Council) Dyspnea Scale is used to assess the degree of baseline functional disability in patients of respiratory  disease due to dyspnea. No minimal important difference is established. A decrease in score of 1 point or greater is considered a positive change.   Pulmonary Function Assessment:   Exercise Target Goals: Exercise Program Goal: Individual exercise prescription set using results from initial 6 min walk test and THRR while considering  patient's activity barriers and safety.   Exercise Prescription Goal: Initial exercise prescription builds to 30-45 minutes a day of aerobic activity, 2-3 days per week.  Home exercise guidelines will be given to patient during program as part of exercise prescription that the participant will acknowledge.  Activity  Barriers & Risk Stratification:   6 Minute Walk: 6 Minute Walk    Row Name 10/09/19 1314         6 Minute Walk   Phase  Initial     Distance  914 feet     Walk Time  6 minutes     # of Rest Breaks  0     MPH  1.73     METS  2.07     RPE  15     Perceived Dyspnea   3     VO2 Peak  7.2     Symptoms  Yes (comment)     Comments  Patient had some pain between shoulder blades that she attributed to wearing the wrong undergarments. Patient reported that she has had it before. Patient was informed that pain could be angina or sign/symptom of CAD in females. Patient will pay attention to when and what she is doing when pain occurs and if wearinf different more supportive sports bra does in fact make it better.     Resting HR  80 bpm     Resting BP  118/70     Resting Oxygen Saturation   95 %     Exercise Oxygen Saturation  during 6 min walk  94 %     Max Ex. HR  106 bpm     Max Ex. BP  148/80     2 Minute Post BP  118/80       Interval HR   1 Minute HR  99     2 Minute HR  106     3 Minute HR  101     4 Minute HR  102     5 Minute HR  100     6 Minute HR  103     2 Minute Post HR  91     Interval Heart Rate?  Yes       Interval Oxygen   Interval Oxygen?  Yes     Baseline Oxygen Saturation %  95 %     1 Minute Oxygen Saturation %   95 %     1 Minute Liters of Oxygen  0 L     2 Minute Oxygen Saturation %  94 %     2 Minute Liters of Oxygen  0 L     3 Minute Oxygen Saturation %  95 %     3 Minute Liters of Oxygen  0 L     4 Minute Oxygen Saturation %  94 %     4 Minute Liters of Oxygen  0 L     5 Minute Oxygen Saturation %  95 %     5 Minute Liters of Oxygen  0 L     6 Minute Oxygen Saturation %  94 %     6 Minute Liters of Oxygen  0 L     2 Minute Post Oxygen Saturation %  95 %     2 Minute Post Liters of Oxygen  0 L       Oxygen Initial Assessment: Oxygen Initial Assessment - 10/04/19 1444      Home Oxygen   Home Oxygen Device  None    Sleep Oxygen Prescription  None   patient did not tolerate CPAP   Home Exercise Oxygen Prescription  None    Home at Rest Exercise Oxygen Prescription  None    Compliance with Home Oxygen Use  No  does not use CPAP     Initial 6 min Walk   Oxygen Used  None      Program Oxygen Prescription   Program Oxygen Prescription  None      Intervention   Short Term Goals  To learn and demonstrate proper use of respiratory medications;To learn and demonstrate proper pursed lip breathing techniques or other breathing techniques.;To learn and understand importance of maintaining oxygen saturations>88%;To learn and understand importance of monitoring SPO2 with pulse oximeter and demonstrate accurate use of the pulse oximeter.    Long  Term Goals  Demonstrates proper use of MDI's;Compliance with respiratory medication;Exhibits proper breathing techniques, such as pursed lip breathing or other method taught during program session;Maintenance of O2 saturations>88%;Verbalizes importance of monitoring SPO2 with pulse oximeter and return demonstration       Oxygen Re-Evaluation: Oxygen Re-Evaluation    Row Name 11/02/19 0917             Program Oxygen Prescription   Program Oxygen Prescription  None         Home Oxygen   Home Oxygen Device  None       Sleep Oxygen Prescription   None       Home Exercise Oxygen Prescription  None       Home at Rest Exercise Oxygen Prescription  None         Goals/Expected Outcomes   Short Term Goals  To learn and demonstrate proper use of respiratory medications;To learn and demonstrate proper pursed lip breathing techniques or other breathing techniques.;To learn and understand importance of maintaining oxygen saturations>88%;To learn and understand importance of monitoring SPO2 with pulse oximeter and demonstrate accurate use of the pulse oximeter.       Long  Term Goals  Demonstrates proper use of MDI's;Compliance with respiratory medication;Exhibits proper breathing techniques, such as pursed lip breathing or other method taught during program session;Maintenance of O2 saturations>88%;Verbalizes importance of monitoring SPO2 with pulse oximeter and return demonstration       Comments  Her saturations are doing well while she is awake but night time are a little low.  Overall, saturations are improving.  She wants to schedule an appointment with Dr. Mortimer Fries to talk about nighttime O2.  During the day she is maintaining above 95%. She has been using the PLB some and it can help.       Goals/Expected Outcomes  Short; Talk to doctor about saturations.  Long: Continue to use PLB.          Oxygen Discharge (Final Oxygen Re-Evaluation): Oxygen Re-Evaluation - 11/02/19 0917      Program Oxygen Prescription   Program Oxygen Prescription  None      Home Oxygen   Home Oxygen Device  None    Sleep Oxygen Prescription  None    Home Exercise Oxygen Prescription  None    Home at Rest Exercise Oxygen Prescription  None      Goals/Expected Outcomes   Short Term Goals  To learn and demonstrate proper use of respiratory medications;To learn and demonstrate proper pursed lip breathing techniques or other breathing techniques.;To learn and understand importance of maintaining oxygen saturations>88%;To learn and understand importance of monitoring SPO2  with pulse oximeter and demonstrate accurate use of the pulse oximeter.    Long  Term Goals  Demonstrates proper use of MDI's;Compliance with respiratory medication;Exhibits proper breathing techniques, such as pursed lip breathing or other method taught during program session;Maintenance of O2 saturations>88%;Verbalizes importance of monitoring SPO2  with pulse oximeter and return demonstration    Comments  Her saturations are doing well while she is awake but night time are a little low.  Overall, saturations are improving.  She wants to schedule an appointment with Dr. Mortimer Fries to talk about nighttime O2.  During the day she is maintaining above 95%. She has been using the PLB some and it can help.    Goals/Expected Outcomes  Short; Talk to doctor about saturations.  Long: Continue to use PLB.       Initial Exercise Prescription: Initial Exercise Prescription - 10/09/19 1300      Date of Initial Exercise RX and Referring Provider   Date  10/09/19    Referring Provider  Gollan      Treadmill   MPH  1.2    Grade  0    Minutes  3    METs  2      NuStep   Level  1    SPM  80    Minutes  15    METs  2      Arm Ergometer   Level  1    Watts  15    RPM  20    Minutes  5    METs  1.5      T5 Nustep   Level  1    SPM  60    Minutes  15    METs  1.5      Biostep-RELP   Level  1    SPM  50    Minutes  15    METs  1      Prescription Details   Duration  Progress to 30 minutes of continuous aerobic without signs/symptoms of physical distress      Intensity   Ratings of Perceived Exertion  11-15    Perceived Dyspnea  0-4      Progression   Progression  Continue progressive overload as per policy without signs/symptoms or physical distress.      Resistance Training   Training Prescription  Yes    Weight  3    Reps  10-15       Perform Capillary Blood Glucose checks as needed.  Exercise Prescription Changes: Exercise Prescription Changes    Row Name 10/17/19 1300  10/31/19 1200 11/12/19 0900         Response to Exercise   Blood Pressure (Admit)  130/64  128/70  134/70     Blood Pressure (Exercise)  144/68  152/80  128/76     Blood Pressure (Exit)  132/70  136/74  124/66     Heart Rate (Admit)  86 bpm  82 bpm  88 bpm     Heart Rate (Exercise)  88 bpm  101 bpm  102 bpm     Heart Rate (Exit)  86 bpm  88 bpm  80 bpm     Oxygen Saturation (Admit)  93 %  94 %  96 %     Oxygen Saturation (Exercise)  96 %  94 %  95 %     Oxygen Saturation (Exit)  93 %  95 %  96 %     Rating of Perceived Exertion (Exercise)  _0 Perceived Dyspnea (Exercise)  _1 Symptoms  SOB  SOB  SOB     Duration  Continue with 30 min of aerobic exercise without signs/symptoms of physical  distress.  Continue with 30 min of aerobic exercise without signs/symptoms of physical distress.  Continue with 30 min of aerobic exercise without signs/symptoms of physical distress.     Intensity  THRR unchanged  THRR unchanged  THRR unchanged       Progression   Progression  Continue to progress workloads to maintain intensity without signs/symptoms of physical distress.  Continue to progress workloads to maintain intensity without signs/symptoms of physical distress.  Continue to progress workloads to maintain intensity without signs/symptoms of physical distress.     Average METs  2.23  1.88  2.83       Resistance Training   Training Prescription  Yes  Yes  Yes     Weight  3 lbs  3 lb  3 lb     Reps  10-15  10-15  10-15       Interval Training   Interval Training  No  No  No       Treadmill   MPH  -  1.5  1.5     Grade  -  0  0     Minutes  -  15  15     METs  -  2.15  2.15       NuStep   Level  -  2  2     Minutes  -  15  15     METs  -  2.2  3.2       Arm Ergometer   Level  1  -  -     Minutes  15  -  -     METs  2.1  -  -       REL-XR   Level  _0 Minutes  _1 METs  2.9  1.3  3.8       T5 Nustep   Level  1  -  4     Minutes  15  -   15     METs  1.9  -  2.2       Biostep-RELP   Level  2  -  -     Minutes  15  -  -     METs  2  -  -       Home Exercise Plan   Plans to continue exercise at  -  Home (comment) walking  Home (comment) walking     Frequency  -  Add 2 additional days to program exercise sessions.  Add 2 additional days to program exercise sessions.     Initial Home Exercises Provided  -  10/18/19  10/18/19        Exercise Comments: Exercise Comments    Row Name 10/10/19 0914 10/15/19 0924 10/18/19 0817       Exercise Comments  First full day of exercise!  Patient was oriented to gym and equipment including functions, settings, policies, and procedures.  Patient's individual exercise prescription and treatment plan were reviewed.  All starting workloads were established based on the results of the 6 minute walk test done at initial orientation visit.  The plan for exercise progression was also introduced and progression will be customized based on patient's performance and goals.  Angina in chest to neck referring. Slowing down resolved symptoms. Advised [name] to work at level below level that caused symptoms.  Reviewed THR, pulse, RPE, sign and  symptoms, NTG use, and when to call 911 or MD.  Also discussed weather considerations and indoor options.  Pt voiced understanding.  Reviewed keeping intensity moderate and not doing high intensity due to connective tissue problems.        Exercise Goals and Review: Exercise Goals    Row Name 10/09/19 1337             Exercise Goals   Increase Physical Activity  Yes       Intervention  Provide advice, education, support and counseling about physical activity/exercise needs.;Develop an individualized exercise prescription for aerobic and resistive training based on initial evaluation findings, risk stratification, comorbidities and participant's personal goals.       Expected Outcomes  Short Term: Attend rehab on a regular basis to increase amount of physical  activity.;Long Term: Add in home exercise to make exercise part of routine and to increase amount of physical activity.;Long Term: Exercising regularly at least 3-5 days a week.       Increase Strength and Stamina  Yes       Intervention  Provide advice, education, support and counseling about physical activity/exercise needs.;Develop an individualized exercise prescription for aerobic and resistive training based on initial evaluation findings, risk stratification, comorbidities and participant's personal goals.       Expected Outcomes  Short Term: Increase workloads from initial exercise prescription for resistance, speed, and METs.;Short Term: Perform resistance training exercises routinely during rehab and add in resistance training at home;Long Term: Improve cardiorespiratory fitness, muscular endurance and strength as measured by increased METs and functional capacity (6MWT)       Able to understand and use rate of perceived exertion (RPE) scale  Yes       Intervention  Provide education and explanation on how to use RPE scale       Expected Outcomes  Short Term: Able to use RPE daily in rehab to express subjective intensity level;Long Term:  Able to use RPE to guide intensity level when exercising independently       Able to understand and use Dyspnea scale  Yes       Intervention  Provide education and explanation on how to use Dyspnea scale       Expected Outcomes  Short Term: Able to use Dyspnea scale daily in rehab to express subjective sense of shortness of breath during exertion;Long Term: Able to use Dyspnea scale to guide intensity level when exercising independently       Knowledge and understanding of Target Heart Rate Range (THRR)  Yes       Intervention  Provide education and explanation of THRR including how the numbers were predicted and where they are located for reference       Expected Outcomes  Short Term: Able to state/look up THRR;Short Term: Able to use daily as guideline for  intensity in rehab;Long Term: Able to use THRR to govern intensity when exercising independently       Able to check pulse independently  Yes       Intervention  Provide education and demonstration on how to check pulse in carotid and radial arteries.;Review the importance of being able to check your own pulse for safety during independent exercise       Expected Outcomes  Short Term: Able to explain why pulse checking is important during independent exercise;Long Term: Able to check pulse independently and accurately       Understanding of Exercise Prescription  Yes  Intervention  Provide education, explanation, and written materials on patient's individual exercise prescription       Expected Outcomes  Short Term: Able to explain program exercise prescription;Long Term: Able to explain home exercise prescription to exercise independently          Exercise Goals Re-Evaluation : Exercise Goals Re-Evaluation    Row Name 10/10/19 0914 10/17/19 1317 10/18/19 0818 10/31/19 1251 11/02/19 0910     Exercise Goal Re-Evaluation   Exercise Goals Review  Increase Physical Activity;Able to understand and use rate of perceived exertion (RPE) scale;Knowledge and understanding of Target Heart Rate Range (THRR);Understanding of Exercise Prescription;Increase Strength and Stamina;Able to understand and use Dyspnea scale  Increase Physical Activity;Increase Strength and Stamina;Understanding of Exercise Prescription  Increase Physical Activity;Increase Strength and Stamina;Able to understand and use rate of perceived exertion (RPE) scale;Knowledge and understanding of Target Heart Rate Range (THRR);Able to check pulse independently;Understanding of Exercise Prescription  Increase Physical Activity;Increase Strength and Stamina;Understanding of Exercise Prescription  Increase Physical Activity;Increase Strength and Stamina;Understanding of Exercise Prescription   Comments  Reviewed RPE scale, THR and program  prescription with pt today.  Pt voiced understanding and was given a copy of goals to take home.  Rachel Torres is off to a good start in rehab.   She has completed her first four sessions. She is eager to improve. We will continue to monitor her progress.  Reviewed THR, pulse, RPE, sign and symptoms, NTG use, and when to call 911 or MD.  Also discussed weather considerations and indoor options.  Pt voiced understanding.  Rachel Torres is doing well in rehab. She is up level 2 on NuStep and 1.5 mph on the treadmill.  We will continue to monitor her progress.  Rachel Torres has been out with her husband.  She has been walking a lot in the hospital with him there.  She has found some videos to use for exercise at home.  Her stamina is improving but not as quick as she would like.   Expected Outcomes  Short: Use RPE daily to regulate intensity. Long: Follow program prescription in THR.  Short: Continue to attend regularly and review home exercise guidelines  Long: Continue to improve stamina.  Short - add 1-2 days of exercise at home Long - maintain exercise on her own  Short: Work on continuous exercise on treadmill.  Long: Continue to add in exercise at home.  Short: Get back to normal exercise routine.  Long: Continue to exercise independently.   [place] Name 11/12/19 4709             Exercise Goal Re-Evaluation   Exercise Goals Review  Increase Physical Activity;Increase Strength and Stamina;Understanding of Exercise Prescription       Comments  Rachel Torres is doing well in rehab.  She now up to 3.8 METs on the XR. We will work with her on moving up her treadmill.  We will continue to monitor risk factors.       Expected Outcomes  Short: Increase treadmill  Long: Continue increase stamina.          Discharge Exercise Prescription (Final Exercise Prescription Changes): Exercise Prescription Changes - 11/12/19 0900      Response to Exercise   Blood Pressure (Admit)  134/70    Blood Pressure (Exercise)  128/76    Blood Pressure  (Exit)  124/66    Heart Rate (Admit)  88 bpm    Heart Rate (Exercise)  102 bpm    Heart Rate (Exit)  80  bpm    Oxygen Saturation (Admit)  96 %    Oxygen Saturation (Exercise)  95 %    Oxygen Saturation (Exit)  96 %    Rating of Perceived Exertion (Exercise)  13    Perceived Dyspnea (Exercise)  3    Symptoms  SOB    Duration  Continue with 30 min of aerobic exercise without signs/symptoms of physical distress.    Intensity  THRR unchanged      Progression   Progression  Continue to progress workloads to maintain intensity without signs/symptoms of physical distress.    Average METs  2.83      Resistance Training   Training Prescription  Yes    Weight  3 lb    Reps  10-15      Interval Training   Interval Training  No      Treadmill   MPH  1.5    Grade  0    Minutes  15    METs  2.15      NuStep   Level  2    Minutes  15    METs  3.2      REL-XR   Level  2    Minutes  15    METs  3.8      T5 Nustep   Level  4    Minutes  15    METs  2.2      Home Exercise Plan   Plans to continue exercise at  Home (comment)   walking   Frequency  Add 2 additional days to program exercise sessions.    Initial Home Exercises Provided  10/18/19       Nutrition:  Target Goals: Understanding of nutrition guidelines, daily intake of sodium <1512m, cholesterol <2028m calories 30% from fat and 7% or less from saturated fats, daily to have 5 or more servings of fruits and vegetables.  Biometrics: Pre Biometrics - 10/09/19 1319      Pre Biometrics   Height  _0  (1.676 m)    Weight  209 lb 12.8 oz (95.2 kg)    BMI (Calculated)  33.88        Nutrition Therapy Plan and Nutrition Goals: Nutrition Therapy & Goals - 11/05/19 1602      Nutrition Therapy   Diet  HH, low Na    Protein (specify units)  75g    Fiber  25 grams    Whole Grain Foods  3 servings    Saturated Fats  12 max. grams    Fruits and Vegetables  5 servings/day    Sodium  1.5 grams      Personal  Nutrition Goals   Nutrition Goal  ST: mindful eating, snacks LT: not get T2DM    Comments  Family hx of T2DM, pt has prediabetes. B: protein bar or oatmeal (instant, prefer it not to be instant), black coffee, yogurt (chobani), when husband is home will have a couple of slices of bacon and sometimes will make slow cooking grits with cheese (2-3x/month), will have eggs (2) 1x/week with bacon or sausage (over easy with butter or bacon drippings). will also drink water. Drinks: seltzer, unsweet tea, coffee. L: varies: fast food sandwich, pimento cheese, chicken salad, spam, will sometimes have salad. S: sometimes: yogurt or power bar. D: varies more with COVID. "not always eating vegetables", will use butter or Rachel oil, baking or pan frying or slow cooker. Pt reports liking vegetables. Chicken or beef; mostly chicken. Pt  reports eating too quickly. Pt reports peanuts triggers her migraines (actual peanut). Pt reports needs to eat more vegetables. Pt has hx of anorexia, discussed health outcomes, not weight loss. Will weigh pt backwards and have staff put in weight on machines. Discussed HH eating.      Intervention Plan   Intervention  Prescribe, educate and counsel regarding individualized specific dietary modifications aiming towards targeted core components such as weight, hypertension, lipid management, diabetes, heart failure and other comorbidities.;Nutrition handout(s) given to patient.    Expected Outcomes  Short Term Goal: Understand basic principles of dietary content, such as calories, fat, sodium, cholesterol and nutrients.;Short Term Goal: A plan has been developed with personal nutrition goals set during dietitian appointment.;Long Term Goal: Adherence to prescribed nutrition plan.       Nutrition Assessments:   Nutrition Goals Re-Evaluation:   Nutrition Goals Discharge (Final Nutrition Goals Re-Evaluation):   Psychosocial: Target Goals: Acknowledge presence or absence of significant  depression and/or stress, maximize coping skills, provide positive support system. Participant is able to verbalize types and ability to use techniques and skills needed for reducing stress and depression.   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 Her 46 year old mother lives with her.      Family Dynamics   Good Support System?  Yes    Comments  She looks to her paster and her therapist for support.      Barriers   Psychosocial barriers to participate in program  The patient should benefit from training in stress management and relaxation.;Psychosocial barriers identified (see note)      Screening Interventions   Interventions  Encouraged to exercise    Expected Outcomes  Long Term goal: The participant improves quality of Life and PHQ9 Scores as seen by post scores and/or verbalization of changes;Short Term goal: Identification and review with participant of any Quality of Life or Depression concerns found by scoring the questionnaire.;Long Term Goal: Stressors or current issues are controlled or eliminated.;Short Term goal: Utilizing psychosocial counselor, staff and physician to assist with identification of specific Stressors or current issues interfering with healing process. Setting desired goal for each stressor or current issue identified.       Quality of Life Scores:  Scores of 19 and below usually indicate a poorer quality of life in these areas.  A difference of  2-3 points is a clinically meaningful difference.  A difference of 2-3 points in the total score of the Quality of Life Index has been associated with significant improvement in overall quality of life, self-image, physical symptoms,  and general health in studies assessing change in quality of life.  PHQ-9: Recent Review Flowsheet Data    Depression screen [facility] 2/9 06/05/2019 12/07/2017 06/28/2017   Decreased Interest 1 0 1   Down, Depressed, Hopeless 1 0 0   PHQ - 2 Score 2 0 1   Altered sleeping 2 0 3   Tired, decreased energy 2 0 2   Change in appetite 2 0 3   Feeling bad or failure about yourself  0 0 1   Trouble concentrating 1 0 1   Moving slowly or fidgety/restless 0 0 1   Suicidal thoughts 0  0 0   PHQ-9 Score 9 0 12   Difficult doing work/chores Not difficult at all Not difficult at all -     Interpretation of Total Score  Total Score Depression Severity:  1-4 = Minimal depression, 5-9 = Mild depression, 10-14 = Moderate depression, 15-19 = Moderately severe depression, 20-27 = Severe depression   Psychosocial Evaluation and Intervention:   Psychosocial Re-Evaluation: Psychosocial Re-Evaluation    [place] Name 11/02/19 0913             Psychosocial Re-Evaluation   Current issues with  Current Stress Concerns       Comments  Rachel Torres has been under a lot of stress recently. Her husband fell and broke his hip.  She has been chasing him and following him around hospital and getting him in at home.  She has not broken down and minimal headache.       Expected Outcomes  Short: Remember to care for self.  Long: Continue to get back to normal.       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 Her 67 year old mother lives with her.         Initial Review   Source of Stress Concerns  Family          Psychosocial Discharge (Final Psychosocial Re-Evaluation): Psychosocial Re-Evaluation - 11/02/19 0913      Psychosocial Re-Evaluation   Current issues with  Current Stress Concerns    Comments  Rachel Torres has  been under a lot of stress recently. Her husband fell and broke his hip.  She has been chasing him and following him around hospital and getting him in at home.  She has not broken down and minimal headache.    Expected Outcomes  Short: Remember to care for self.  Long: Continue to get back to normal.    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 Her 48 year old mother lives with her.      Initial Review   Source of Stress Concerns  Family       Education: Education Goals: Education classes will be provided on a weekly basis, covering required topics. Participant will state understanding/return demonstration of topics presented.  Learning Barriers/Preferences: Learning Barriers/Preferences - 10/04/19 1441      Learning Barriers/Preferences   Learning Barriers  None    Learning Preferences  None       Education Topics:  Initial Evaluation Education: - Verbal, written and demonstration of respiratory meds, oximetry and breathing techniques. Instruction on use of nebulizers and MDIs and importance of monitoring MDI activations.   Pulmonary Rehab from 10/04/2019 in [facility] Cardiac and Pulmonary Rehab  Date  10/04/19  Educator  [facility]  Instruction Review Code  1- Verbalizes Understanding      General Nutrition Guidelines/Fats and Fiber: -Group instruction provided by verbal, written material, models and posters to present the general guidelines for heart healthy nutrition. Gives an explanation and review of dietary fats and fiber.   Controlling Sodium/Reading Food Labels: -Group  verbal and written material supporting the discussion of sodium use in heart healthy nutrition. Review and explanation with models, verbal and written materials for utilization of the food label.   Exercise  Physiology & General Exercise Guidelines: - Group verbal and written instruction with models to review the exercise physiology of the cardiovascular system and associated critical values. Provides general exercise guidelines with specific guidelines to those with heart or lung disease.    Aerobic Exercise & Resistance Training: - Gives group verbal and written instruction on the various components of exercise. Focuses on aerobic and resistive training programs and the benefits of this training and how to safely progress through these programs.   Cardiac Rehab from 10/10/2019 in [name] Cardiac and Pulmonary Rehab  Date  10/10/19  Educator  [place]  Instruction Review Code  1- Verbalizes Understanding      Flexibility, Balance, Mind/Body Relaxation: Provides group verbal/written instruction on the benefits of flexibility and balance training, including mind/body exercise modes such as yoga, pilates and tai chi.  Demonstration and skill practice provided.   Stress and Anxiety: - Provides group verbal and written instruction about the health risks of elevated stress and causes of high stress.  Discuss the correlation between heart/lung disease and anxiety and treatment options. Review healthy ways to manage with stress and anxiety.   Depression: - Provides group verbal and written instruction on the correlation between heart/lung disease and depressed mood, treatment options, and the stigmas associated with seeking treatment.   Exercise & Equipment Safety: - Individual verbal instruction and demonstration of equipment use and safety with use of the equipment.   Cardiac Rehab from 10/10/2019 in [facility] Cardiac and Pulmonary Rehab  Date  10/09/19  Educator  [place]  Instruction Review Code  1- Verbalizes Understanding      Infection Prevention: - Provides verbal and written material to individual with discussion of infection control including proper hand washing and proper equipment cleaning during  exercise session.   Cardiac Rehab from 10/10/2019 in [facility] Cardiac and Pulmonary Rehab  Date  10/09/19  Educator  [place]  Instruction Review Code  1- Verbalizes Understanding      Falls Prevention: - Provides verbal and written material to individual with discussion of falls prevention and safety.   Cardiac Rehab from 10/10/2019 in [facility] Cardiac and Pulmonary Rehab  Date  10/09/19  Educator  [place]  Instruction Review Code  1- Verbalizes Understanding      Diabetes: - Individual verbal and written instruction to review signs/symptoms of diabetes, desired ranges of glucose level fasting, after meals and with exercise. Advice that pre and post exercise glucose checks will be done for 3 sessions at entry of program.   Chronic Lung Diseases: - Group verbal and written instruction to review updates, respiratory medications, advancements in procedures and treatments. Discuss use of supplemental oxygen including available portable oxygen systems, continuous and intermittent flow rates, concentrators, personal use and safety guidelines. Review proper use of inhaler and spacers. Provide informative websites for self-education.    Energy Conservation: - Provide group verbal and written instruction for methods to conserve energy, plan and organize activities. Instruct on pacing techniques, use of adaptive equipment and posture/positioning to relieve shortness of breath.   Triggers and Exacerbations: - Group verbal and written instruction to review types of environmental triggers and ways to prevent exacerbations. Discuss weather changes, air quality and the benefits of nasal washing. Review warning signs and symptoms to help prevent infections. Discuss techniques for effective airway clearance, coughing, and  vibrations.   AED/CPR: - Group verbal and written instruction with the use of models to demonstrate the basic use of the AED with the basic ABC's of resuscitation.   Anatomy and Physiology of the  Lungs: - Group verbal and written instruction with the use of models to provide basic lung anatomy and physiology related to function, structure and complications of lung disease.   Anatomy & Physiology of the Heart: - Group verbal and written instruction and models provide basic cardiac anatomy and physiology, with the coronary electrical and arterial systems. Review of Valvular disease and Heart Failure   Cardiac Medications: - Group verbal and written instruction to review commonly prescribed medications for heart disease. Reviews the medication, class of the drug, and side effects.   Know Your Numbers and Risk Factors: -Group verbal and written instruction about important numbers in your health.  Discussion of what are risk factors and how they play a role in the disease process.  Review of Cholesterol, Blood Pressure, Diabetes, and BMI and the role they play in your overall health.   Cardiac Rehab from 10/10/2019 in [facility] Cardiac and Pulmonary Rehab  Date  10/10/19  Educator  [place]  Instruction Review Code  1- Verbalizes Understanding      Sleep Hygiene: -Provides group verbal and written instruction about how sleep can affect your health.  Define sleep hygiene, discuss sleep cycles and impact of sleep habits. Review good sleep hygiene tips.    Other: -Provides group and verbal instruction on various topics (see comments)    Knowledge Questionnaire Score:    Core Components/Risk Factors/Patient Goals at Admission: Personal Goals and Risk Factors at Admission - 10/04/19 1444      Core Components/Risk Factors/Patient Goals on Admission    Weight Management  Obesity;Weight Loss    Improve shortness of breath with ADL's  Yes    Intervention  Provide education, individualized exercise plan and daily activity instruction to help decrease symptoms of SOB with activities of daily living.    Expected Outcomes  Short Term: Improve cardiorespiratory fitness to achieve a reduction of  symptoms when performing ADLs;Long Term: Be able to perform more ADLs without symptoms or delay the onset of symptoms    Heart Failure  Yes    Intervention  Provide a combined exercise and nutrition program that is supplemented with education, support and counseling about heart failure. Directed toward relieving symptoms such as shortness of breath, decreased exercise tolerance, and extremity edema.    Expected Outcomes  Improve functional capacity of life;Short term: Attendance in program 2-3 days a week with increased exercise capacity. Reported lower sodium intake. Reported increased fruit and vegetable intake. Reports medication compliance.;Short term: Daily weights obtained and reported for increase. Utilizing diuretic protocols set by physician.;Long term: Adoption of self-care skills and reduction of barriers for early signs and symptoms recognition and intervention leading to self-care maintenance.    Hypertension  Yes    Intervention  Provide education on lifestyle modifcations including regular physical activity/exercise, weight management, moderate sodium restriction and increased consumption of fresh fruit, vegetables, and low fat dairy, alcohol moderation, and smoking cessation.;Monitor prescription use compliance.    Expected Outcomes  Short Term: Continued assessment and intervention until BP is < 140/16m HG in hypertensive participants. < 130/859mHG in hypertensive participants with diabetes, heart failure or chronic kidney disease.;Long Term: Maintenance of blood pressure at goal levels.    Lipids  Yes    Intervention  Provide education and support for participant on nutrition &  aerobic/resistive exercise along with prescribed medications to achieve LDL '70mg'$ , HDL >'40mg'$ .    Expected Outcomes  Short Term: Participant states understanding of desired cholesterol values and is compliant with medications prescribed. Participant is following exercise prescription and nutrition guidelines.;Long  Term: Cholesterol controlled with medications as prescribed, with individualized exercise RX and with personalized nutrition plan. Value goals: LDL < '70mg'$ , HDL > 40 mg.       Core Components/Risk Factors/Patient Goals Review:  Goals and Risk Factor Review    Row Name 11/02/19 0915             Core Components/Risk Factors/Patient Goals Review   Personal Goals Review  Weight Management/Obesity;Improve shortness of breath with ADL's;Heart Failure;Hypertension       Review  Rachel Torres is doing well at home.  Her weight has been going up with stress eating around her husband's fall.  She is breathing better and able to exercise and talk more easily now.  She is doing more around the house as well. She denies symptoms of heart failure currently.  She had one night of swelling but otherwise doing well.  Her breathing is doing okay.  Blood pressures are doing well at home too.       Expected Outcomes  Short: Back off stress eating to help with weight. Long; Continue to manage heart failure.          Core Components/Risk Factors/Patient Goals at Discharge (Final Review):  Goals and Risk Factor Review - 11/02/19 0915      Core Components/Risk Factors/Patient Goals Review   Personal Goals Review  Weight Management/Obesity;Improve shortness of breath with ADL's;Heart Failure;Hypertension    Review  Rachel Torres is doing well at home.  Her weight has been going up with stress eating around her husband's fall.  She is breathing better and able to exercise and talk more easily now.  She is doing more around the house as well. She denies symptoms of heart failure currently.  She had one night of swelling but otherwise doing well.  Her breathing is doing okay.  Blood pressures are doing well at home too.    Expected Outcomes  Short: Back off stress eating to help with weight. Long; Continue to manage heart failure.       ITP Comments: ITP Comments    Row Name 10/04/19 1451 10/04/19 1457 10/15/19 0924 10/24/19 0700  11/05/19 1646   ITP Comments  Virtual Visit Completed.  -  Angina in chest to neck referring. Slowing down resolved symptoms. Advised [name] to work at level below level that caused symptoms.  30 day review completed. Continue with ITP sent to Dr. Emily Filbert, Medical Director of Cardiac and Pulmonary Rehab for review , changes as needed and signature.  Out for family medical concerns  Plans to return next week.  Completed RD initial eval   Row Name 11/21/19 1041 11/26/19 1015         ITP Comments  30 day review competed . ITP sent to Dr Emily Filbert for review, changes as needed and ITP approval signature.  [name] called to let us know that she would like to discharge from the program at this time due to the rise in COVID-19 numbers.         Comments: Discharge ITP

## 2019-11-27 ENCOUNTER — Encounter: Payer: Self-pay | Admitting: Internal Medicine

## 2019-11-27 ENCOUNTER — Ambulatory Visit (INDEPENDENT_AMBULATORY_CARE_PROVIDER_SITE_OTHER): Payer: Medicare HMO | Admitting: Internal Medicine

## 2019-11-27 ENCOUNTER — Other Ambulatory Visit: Payer: Self-pay

## 2019-11-27 DIAGNOSIS — J45909 Unspecified asthma, uncomplicated: Secondary | ICD-10-CM

## 2019-11-27 DIAGNOSIS — J4521 Mild intermittent asthma with (acute) exacerbation: Secondary | ICD-10-CM | POA: Diagnosis not present

## 2019-11-27 NOTE — Progress Notes (Addendum)
Name: Rachel Torres MRN: [id number] DOB: [birth year]     I connected with the patient by telephone enabled telemedicine visit and verified that I am speaking with the correct person using two identifiers.    I discussed the limitations, risks, security and privacy concerns of performing an evaluation and management service by telemedicine and the availability of in-person appointments. I also discussed with the patient that there may be a patient responsible charge related to this service. The patient expressed understanding and agreed to proceed.  PATIENT AGREES AND CONFIRMS -YES   Other persons participating in the visit and their role in the encounter: Patient, nursing  This visit type was conducted due to national recommendations for restrictions regarding the COVID-19 Pandemic (e.g. social distancing).  This format is felt to be most appropriate for this patient at this time.  All issues noted in this document were discussed and addressed.        CONSULTATION DATE: 11/27/2019  REFERRING MD : Idolina Primer    CARDIAC HISTORY - history of CAD s/p 1 vessel CABG with SVG-RCA in 08/2010 coupled with type a ascending aortic dissection repair and bioprosthetic AVR at the same time with residual descending aortic dissection extending into the iliac artery followed by vascular surgery, patient reported SVT vs Afib in the 1990s s/p self reported ablation with details unclear, marfan syndrome, asthma, hypertensive heart disease, chronic combined CHF with subsequent normalization of her EF, migraine disorder, OSA not on CPAP, back pain, and anxiety    Nuclear stress test at [facility] in 08/2012 without ischemia and EF of 52%  echo from 04/2015 showed an EF of 40-45% which was improved from prior. - echo from 01/2016 showed normalization of her EF to [id number], LV diastolic function was normal, septal motion c/w post-operative state, normal functioning bioprosthetic arotic valve without stenosis, PASP normal.  She was admitted in 2017 with chest pain and ruled out. She underwent Lexiscan Myoview that was low risk with probably attenuation artifact. She continued to note symptoms, though they seemed to be associated with food intake. GI work up showed a small hiatal hernia and several small polyps. Symptoms improved with GI cocktail. She was seen in the ED in 01/2018 and told she had Afib. She was prescribed Coumadin through the ED, though she did not start this. Upon her cardiologist reviewing the EKGs, she was noted to have sinus rhythm with PACs. There was no evidence of Afib.  01/2018 Echo at that time showed an EF of 50-55%, anteroseptal HK, Gr2DD, bioprosthetic aortic valve was noted, mild MR, mildly dilated left atrium, RVSF normal, PASP normal.  She was last seen in the office in 06/2018, noting increased stress at home and weight gain in the setting of poor diet. Since she was last seen, she has been seen in the ED and admitted several times for asthma exacerbations.    She continues to have stable exertional shortness of breath that she attributes to her obesity and physical deconditioning.    She denies any chest pain, palpitations, presyncope, or syncope.  She does note some dizziness if she bends over.  She has not followed up with vascular surgery and needs to make an appointment with them.  Her blood pressure remains well controlled in the 1 teens to [id number] systolic.  She denies any lower extremity swelling, abdominal distention, orthopnea, PND, early satiety.  She states her weight had peaked at 216 pounds approximately 1 month ago though she has subsequently discontinued almost all carbs  outside of this past weekend when she visited with her son and had quite a few desserts.  No falls, BRBPR, or melena.  She continues to have significant amount of stressors at home with the help of her husband and mother.   PULM HISTORY In February 2020 patient was hospitalized for pneumonia and was diagnosed with  influenza pneumonia She was diagnosed with respiratory failure placed on oxygen therapy  Since her discharge from the hospital she had increased shortness of breath and dyspnea exertion no chest pain no palpitations Has intermittent productive cough but no wheezing She was prescribed nebulizers and has had helped tremendously but inhalers did not help She has had 4-5 pneumonias in the last 4 to 5 years Patient states she was diagnosed with asthma in her 50s however there are no triggers no history of eczema Patient works with perfumes and candles and scents and there is no triggers at this time  Patient is a non-smoker however had secondhand smoke exposure for approximately 10 years  She has a family history of asthma Patient has been diagnosed with sleep apnea for the last 6 years however is noncompliant for the last 4 years  CHIEF COMPLAINT:   Follow up reactive airways disease     HISTORY OF PRESENT ILLNESS: Very infrequent use of albuterol  No  exacerbation at this time No evidence of heart failure at this time No evidence or signs of infection at this time No respiratory distress No fevers, chills, nausea, vomiting, diarrhea No evidence of lower extremity edema No evidence hemoptysis  intermittent SOB Second hand smoke exposure 15 years         PAST MEDICAL HISTORY :   has a past medical history of AAA (abdominal aortic aneurysm) ([place]), Allergy, Anemia, Anxiety, CAD (coronary artery disease), Chronic combined systolic (congestive) and diastolic (congestive) heart failure ([place]), Depression, Gallbladder sludge, GERD (gastroesophageal reflux disease), History of Bicuspid Aortic Valve, repair of dissecting thoracic aortic aneurysm, Stanford type A, Hyperlipidemia, Hypertensive heart disease, IBS (irritable bowel syndrome), Internal hemorrhoids, Marfan syndrome, Migraine, Obstructive sleep apnea, Osteoporosis, Pneumonia, and RA (rheumatoid arthritis) ([place]).  has a past  surgical history that includes Cesarean section ([place]); Cardiac catheterization (2001); Fracture surgery (Right); ORIF distal radius fracture (Left); Abdominal aortic aneurysm repair; Cholecystectomy; Colonoscopy; Upper gastrointestinal endoscopy; and Hemorrhoid banding. Prior to Admission medications   Medication Sig Start Date End Date Taking? Authorizing Provider  albuterol (PROVENTIL HFA;VENTOLIN HFA) 108 (90 Base) MCG/ACT inhaler Inhale 1-2 puffs into the lungs every 6 (six) hours as needed for wheezing or shortness of breath. 01/12/19  Yes Pleas Koch, NP  ALPRAZolam Duanne Moron) 0.5 MG tablet Take 1 tablet (0.5 mg total) by mouth daily as needed for anxiety. 06/20/19  Yes Tower, Wynelle Fanny, MD  aspirin EC 81 MG tablet Take 81 mg by mouth at bedtime.   Yes [provider]  atorvastatin (LIPITOR) 20 MG tablet TAKE 1 TABLET EVERY DAY 04/04/19  Yes Gollan, Kathlene November, MD  carvedilol (COREG) 6.25 MG tablet TAKE 1 TABLET TWICE DAILY WITH MEALS 07/09/19  Yes Gollan, Kathlene November, MD  Cholecalciferol (VITAMIN D3) 5000 units CAPS Take 1 capsule by mouth daily.   Yes [provider]  cyclobenzaprine (FLEXERIL) 10 MG tablet Take 0.5-1 tablets (5-10 mg total) by mouth 3 (three) times daily as needed. For headache 06/20/19  Yes Tower, Roque Lias A, MD  furosemide (LASIX) 20 MG tablet TAKE 1 TABLET (20 MG TOTAL) BY MOUTH DAILY. 05/24/19  Yes [name]  J, MD  Krill Oil (OMEGA-3) 500 MG CAPS Take 1 capsule by mouth daily.   Yes [provider]  lisinopril (PRINIVIL,ZESTRIL) 5 MG tablet TAKE 1/2 TABLET (2.5 MG TOTAL) BY MOUTH DAILY. 02/20/19  Yes Gollan, Kathlene November, MD  magnesium gluconate (MAGONATE) 500 MG tablet Take 500 mg by mouth at bedtime.   Yes [provider]  Misc Natural Products (TART CHERRY ADVANCED) CAPS Take 1 capsule by mouth daily.   Yes [provider]  naproxen sodium (ANAPROX) 220 MG tablet Take 440 mg by mouth 2 (two) times daily as needed (for  headaches/pain).   Yes [provider]  nitroGLYCERIN (NITROSTAT) 0.4 MG SL tablet Place 1 tablet (0.4 mg total) under the tongue every 5 (five) minutes as needed for chest pain. 04/26/17  Yes Minna Merritts, MD  omeprazole (PRILOSEC) 40 MG capsule TAKE 1 CAPSULE EVERY DAY 04/05/19  Yes Tower, Wynelle Fanny, MD  potassium chloride (K-DUR) 10 MEQ tablet TAKE 1 TABLET TWICE DAILY AS NEEDED 07/09/19  Yes Gollan, Kathlene November, MD  promethazine (PHENERGAN) 25 MG tablet Take 0.5 tablets (12.5 mg total) by mouth every 8 (eight) hours as needed for nausea. 06/20/19  Yes Tower, Wynelle Fanny, MD  venlafaxine XR (EFFEXOR-XR) 150 MG 24 hr capsule TAKE 1 CAPSULE EVERY DAY WITH BREAKFAST 04/05/19  Yes Tower, Wynelle Fanny, MD   Allergies  Allergen Reactions   Fosamax [Alendronate Sodium]     GI upset    Ginzing [Ginseng] Other (See Comments)    Hyper and jitery    Hydrocod Polst-Cpm Polst Er Itching   Hydrocodone-Acetaminophen Itching   Imitrex [Sumatriptan]     Contraindicated w/ heart condition   Oxycodone Itching   Peanut-Containing Drug Products Other (See Comments)    Reaction:  Migraines    Tramadol Other (See Comments)    itching   Hydrocodone-Guaifenesin Itching   Prednisone Rash   Tape Itching   SOCIAL HISTORY:  reports that she quit smoking about 40 years ago. Her smoking use included cigarettes. She has a 1.13 pack-year smoking history. She has never used smokeless tobacco. She reports current alcohol use. She reports that she does not use drugs.     Review of Systems:  Gen:  Denies  fever, sweats, chills weight loss  HEENT: Denies blurred vision, double vision, ear pain, eye pain, hearing loss, nose bleeds, sore throat Cardiac:  No dizziness, chest pain or heaviness, chest tightness,edema, No JVD Resp:   No cough, -sputum production, -shortness of breath,-wheezing, -hemoptysis,  Gi: Denies swallowing difficulty, stomach pain, nausea or vomiting, diarrhea, constipation, bowel  incontinence Gu:  Denies bladder incontinence, burning urine Ext:   Denies Joint pain, stiffness or swelling Skin: Denies  skin rash, easy bruising or bleeding or hives Endoc:  Denies polyuria, polydipsia , polyphagia or weight change Psych:   Denies depression, insomnia or hallucinations  Other:  All other systems negative     MEDICATIONS: I have reviewed all medications and confirmed regimen as documented     IMAGING  Intermittent allergic bronchitis Uses albuterol as needed No need for abx and steroids PFT's are WNL-results reviewed with patient  Previous h/o INLF pneumonia Borderline OSA-recommend weight loss     COVID-19 EDUCATION: The signs and symptoms of COVID-19 were discussed with the patient and how to seek care for testing.  The importance of social distancing was discussed today. Hand Washing Techniques and avoid touching face was advised.  MEDICATION ADJUSTMENTS/LABS AND TESTS ORDERED: Albuterol as needed  CURRENT MEDICATIONS REVIEWED AT LENGTH WITH PATIENT TODAY   Patient satisfied with Plan of action and management. All questions answered  Follow up in 1 year  Total Time spent 22 mins  Abdiel Blackerby Patricia Pesa, M.D.  [facility]  Medical Director [place] Director [facility] Cardio-Pulmonary Department

## 2019-11-27 NOTE — Patient Instructions (Signed)
MEDICATION ADJUSTMENTS/LABS AND TESTS ORDERED: Albuterol as needed Avoid second hand smoke

## 2019-11-30 DIAGNOSIS — J4520 Mild intermittent asthma, uncomplicated: Secondary | ICD-10-CM | POA: Diagnosis not present

## 2019-11-30 DIAGNOSIS — J4541 Moderate persistent asthma with (acute) exacerbation: Secondary | ICD-10-CM | POA: Diagnosis not present

## 2019-12-05 ENCOUNTER — Ambulatory Visit (INDEPENDENT_AMBULATORY_CARE_PROVIDER_SITE_OTHER): Payer: Medicare HMO | Admitting: Psychology

## 2019-12-05 DIAGNOSIS — F4323 Adjustment disorder with mixed anxiety and depressed mood: Secondary | ICD-10-CM

## 2019-12-14 DIAGNOSIS — U071 COVID-19: Secondary | ICD-10-CM | POA: Diagnosis not present

## 2019-12-14 DIAGNOSIS — R509 Fever, unspecified: Secondary | ICD-10-CM | POA: Diagnosis not present

## 2019-12-18 ENCOUNTER — Telehealth: Payer: Self-pay

## 2019-12-18 NOTE — Telephone Encounter (Signed)
Pt notified of Dr. Marliss Coots comments. She is okay for now but if sxs worsen or she develops any new sxs she will call back and schedule a virtual visit

## 2019-12-18 NOTE — Telephone Encounter (Signed)
Pt left v/m that she tested positive on 12/14/19 for cov[facility] at [facility]. Pt has no fever now,pt has loss sense of taste, all over body soreness,h/a on and off, very tired and weak. Pt has dry cough mostly but occasional prod cough with ? Color of phlegm. Pt has rash on lower back; no blisters; pt has SOB upon exertion; diarrhea last time 2 days ago. Now has S/T; pt is nauseated and fatigued. No travel and no known exposure to + covid. Recommend stay hydrated, get rest, tylenol for fever. UC & ED precautions given and pt voiced understanding. FYI to Dr Glori Bickers.

## 2019-12-18 NOTE — Telephone Encounter (Signed)
If she wants to touch base with me - please schedule a virtual visit later this week  Hope she feels better soon  Continue to isolate

## 2019-12-19 ENCOUNTER — Ambulatory Visit: Payer: Medicare HMO | Admitting: Psychology

## 2019-12-31 DIAGNOSIS — J4541 Moderate persistent asthma with (acute) exacerbation: Secondary | ICD-10-CM | POA: Diagnosis not present

## 2019-12-31 DIAGNOSIS — J4520 Mild intermittent asthma, uncomplicated: Secondary | ICD-10-CM | POA: Diagnosis not present

## 2020-01-01 ENCOUNTER — Encounter: Payer: Self-pay | Admitting: Family Medicine

## 2020-01-01 ENCOUNTER — Ambulatory Visit (INDEPENDENT_AMBULATORY_CARE_PROVIDER_SITE_OTHER): Payer: Medicare HMO | Admitting: Family Medicine

## 2020-01-01 VITALS — BP 128/60 | HR 73 | Temp 96.6°F

## 2020-01-01 DIAGNOSIS — J0140 Acute pansinusitis, unspecified: Secondary | ICD-10-CM | POA: Diagnosis not present

## 2020-01-01 DIAGNOSIS — Z8616 Personal history of COVID-19: Secondary | ICD-10-CM

## 2020-01-01 DIAGNOSIS — G629 Polyneuropathy, unspecified: Secondary | ICD-10-CM | POA: Diagnosis not present

## 2020-01-01 MED ORDER — AMOXICILLIN-POT CLAVULANATE 875-125 MG PO TABS: 1.0000 | ORAL_TABLET | Freq: Two times a day (BID) | ORAL | 0 refills | Status: AC

## 2020-01-01 NOTE — Progress Notes (Signed)
I connected with Rachel Torres on 01/01/20 at 12:20 PM EST by video and verified that I am speaking with the correct person using two identifiers.   I discussed the limitations, risks, security and privacy concerns of performing an evaluation and management service by video and the availability of in person appointments. I also discussed with the patient that there may be a patient responsible charge related to this service. The patient expressed understanding and agreed to proceed.  Patient location: Home Provider Location: [place] Participants: Lesleigh Noe and Korrine C San   Subjective:     Rachel Torres is a 64 y.o. female presenting for URI (sneezing, runny nose, vertigo, cough, also has some neuropathy in her feet.)     URI  This is a new problem. The current episode started yesterday. The problem has been gradually worsening. Associated symptoms include congestion, coughing, headaches, rhinorrhea, sinus pain and sneezing. Pertinent negatives include no diarrhea, ear pain, sore throat or vomiting.   Initially started getting symptoms on 12/25 -- noticed improvement around 12/21/2018  These symptoms started coming on yesterday  Still loss of taste/smell  Over the last few days - cold toes and also some burning sensation No diabetes or issues with this in the past   Review of Systems  HENT: Positive for congestion, rhinorrhea, sinus pain and sneezing. Negative for ear pain and sore throat.   Respiratory: Positive for cough.   Gastrointestinal: Negative for diarrhea and vomiting.  Neurological: Positive for dizziness (vertigo) and headaches.     Social History   Tobacco Use  Smoking Status Former Smoker  . Packs/day: 0.75  . Years: 1.50  . Pack years: 1.12  . Types: Cigarettes  . Quit date: 12/20/1978  . Years since quitting: 41.0  Smokeless Tobacco Never Used        Objective:   BP Readings from Last 3 Encounters:  01/01/20  128/60  09/10/19 130/80  08/30/19 126/82   Wt Readings from Last 3 Encounters:  10/09/19 209 lb 12.8 oz (95.2 kg)  09/10/19 213 lb 8 oz (96.8 kg)  08/30/19 214 lb (97.1 kg)    BP 128/60   Pulse 73   Temp (!) 96.6 F (35.9 C)   SpO2 94%   Physical Exam Constitutional:      Appearance: Normal appearance. She is not ill-appearing.  HENT:     Head: Normocephalic and atraumatic.     Right Ear: External ear normal.     Left Ear: External ear normal.     Nose: Congestion present.  Eyes:     Conjunctiva/sclera: Conjunctivae normal.  Pulmonary:     Effort: Pulmonary effort is normal. No respiratory distress.  Neurological:     Mental Status: She is alert. Mental status is at baseline.  Psychiatric:        Mood and Affect: Mood normal.        Behavior: Behavior normal.        Thought Content: Thought content normal.        Judgment: Judgment normal.            Assessment & Plan:   Problem List Items Addressed This Visit    None    Visit Diagnoses    Acute non-recurrent pansinusitis    -  Primary   Relevant Medications   amoxicillin-clavulanate (AUGMENTIN) 875-125 MG tablet   Neuropathy       Personal history of covid-19  Based on sudden worsening of viral symptoms while improving concern for sinus infection. Hopefully vertigo will improve with treatment.   Cold toes and neuropathy concerning for possible ischemic changes in setting of Covid. Advised keeping toes warm. Does not have pain or color change at this time so not clear and due to virtual unable to access pulse. Discussed warning signs and advised follow-up if no improvement over the next few days or worsening    Return if symptoms worsen or fail to improve.  Lesleigh Noe, MD

## 2020-01-02 ENCOUNTER — Ambulatory Visit (INDEPENDENT_AMBULATORY_CARE_PROVIDER_SITE_OTHER): Payer: Medicare HMO | Admitting: Psychology

## 2020-01-02 DIAGNOSIS — F4323 Adjustment disorder with mixed anxiety and depressed mood: Secondary | ICD-10-CM

## 2020-01-09 IMAGING — CR DG CHEST 2V
1 series · 2 of 2 positions shown · non-contrast
Comparison: Radiographs September 07, 2017.

CLINICAL DATA: Irregular heart rhythm.

EXAM:
CHEST  2 VIEW

[Series 1: dg chest 2 view · 0.14mm/px · 2 of 2 slices shown]
[im 1/2]
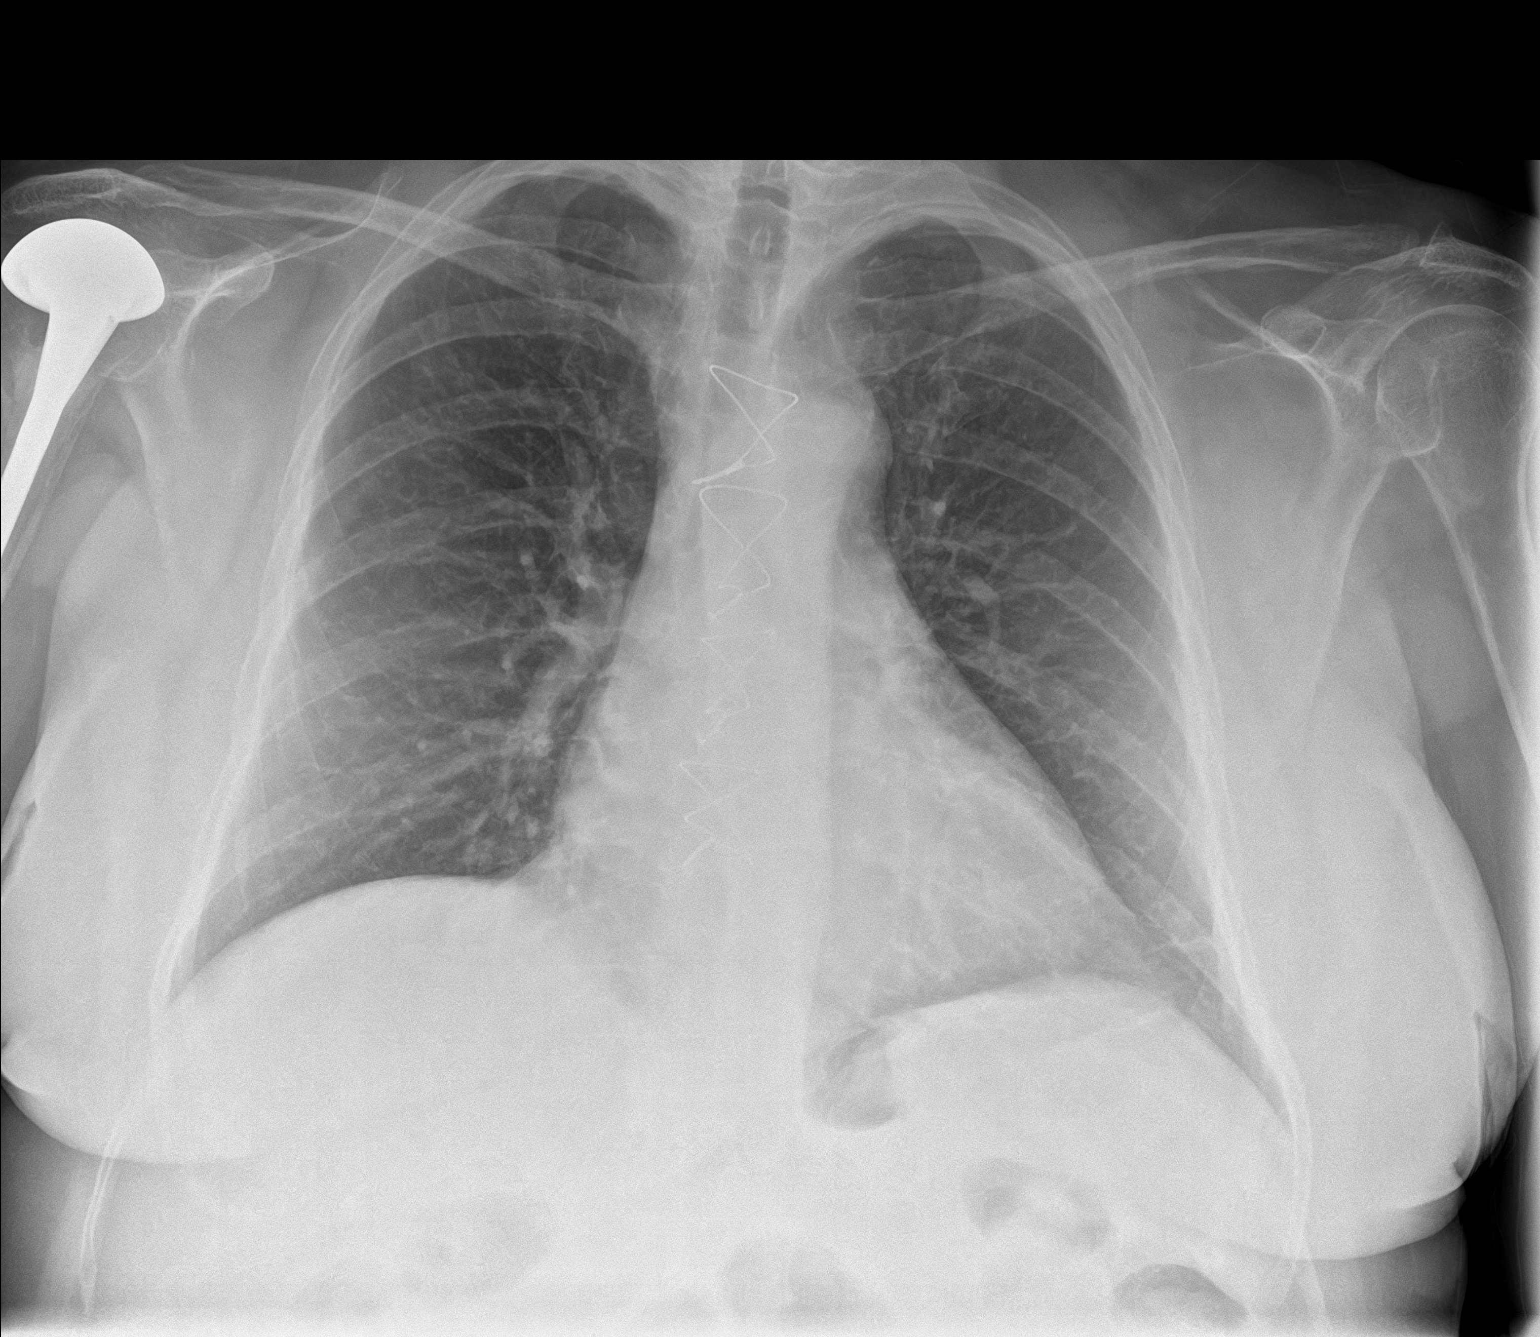
[im 2/2]
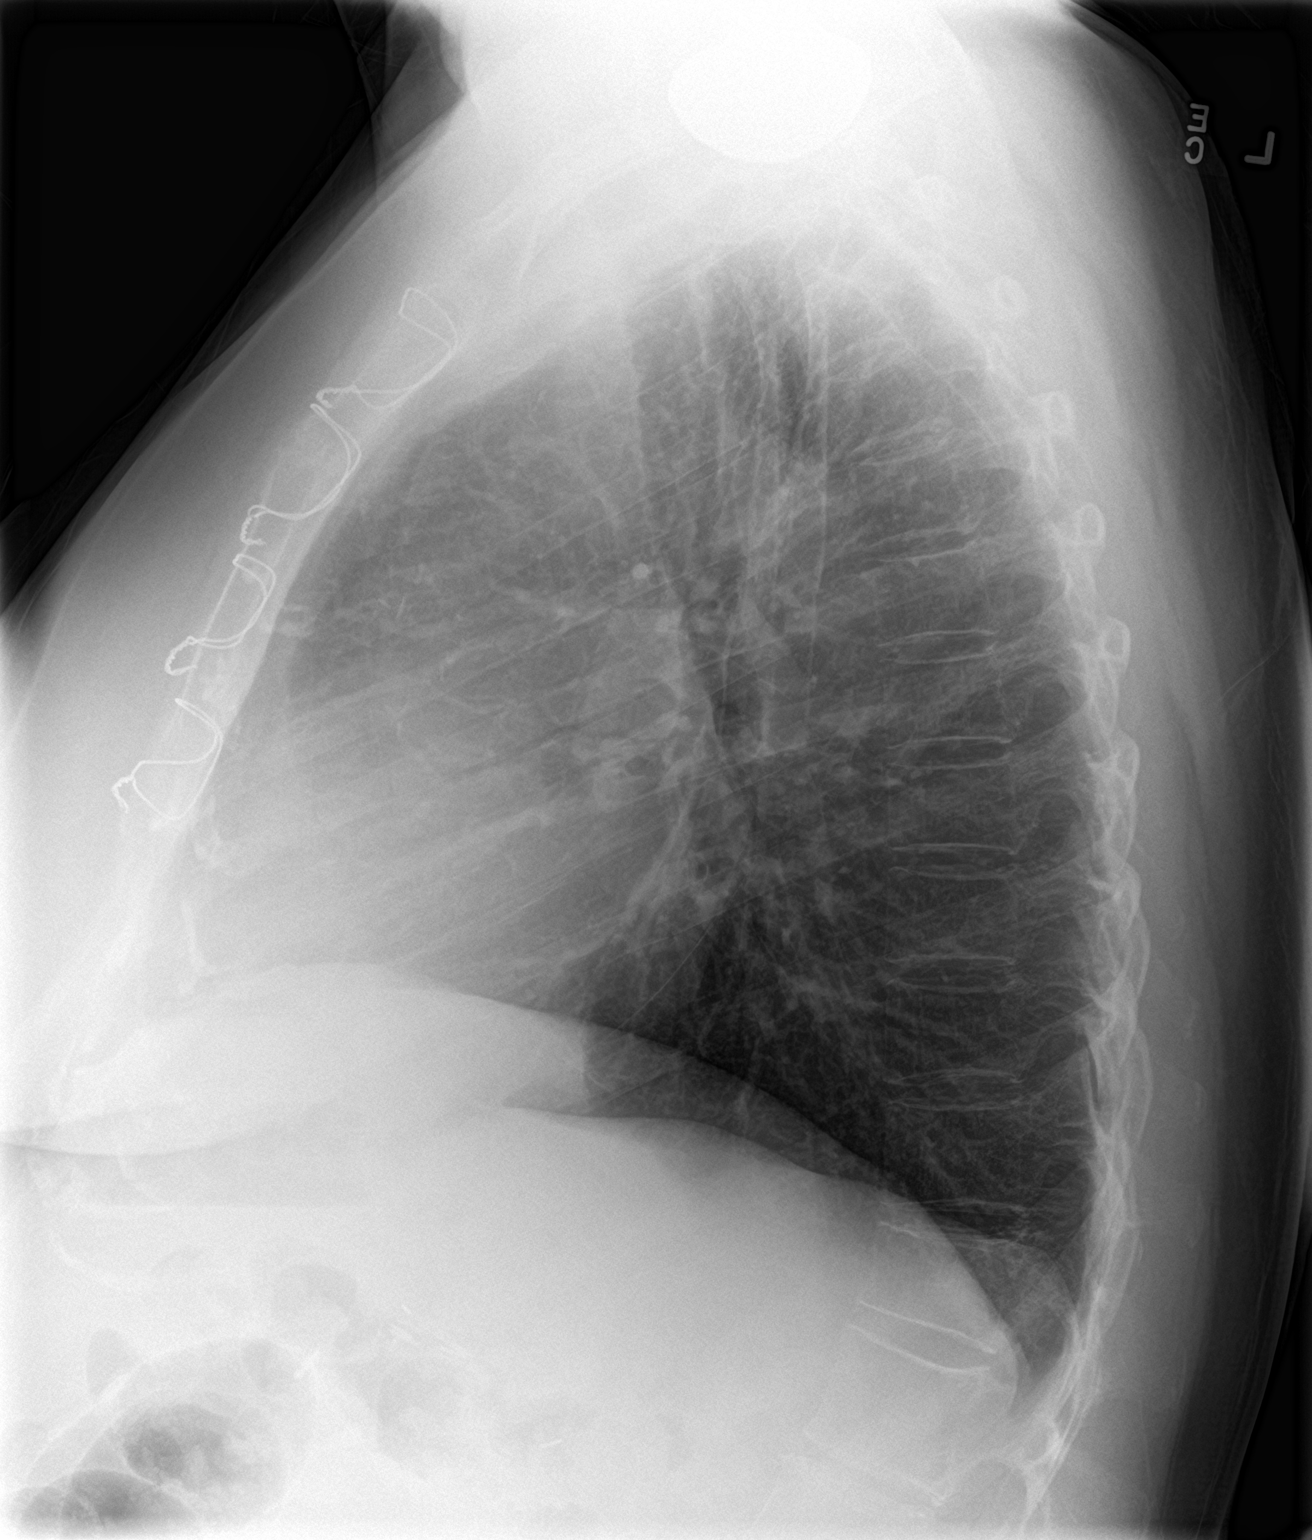

[2 of 2 positions shown; findings below may reference images not displayed]

FINDINGS: The heart size and mediastinal contours are within normal limits.
Both lungs are clear. No pneumothorax or pleural effusion is noted.
Sternotomy wires are noted. The visualized skeletal structures are
unremarkable.
IMPRESSION: No active cardiopulmonary disease.

## 2020-01-16 ENCOUNTER — Ambulatory Visit (INDEPENDENT_AMBULATORY_CARE_PROVIDER_SITE_OTHER): Payer: Medicare HMO | Admitting: Psychology

## 2020-01-16 DIAGNOSIS — F4322 Adjustment disorder with anxiety: Secondary | ICD-10-CM | POA: Diagnosis not present

## 2020-01-18 ENCOUNTER — Other Ambulatory Visit: Payer: Self-pay | Admitting: Cardiovascular Disease

## 2020-01-30 ENCOUNTER — Ambulatory Visit (INDEPENDENT_AMBULATORY_CARE_PROVIDER_SITE_OTHER): Payer: Medicare HMO | Admitting: Psychology

## 2020-01-30 ENCOUNTER — Other Ambulatory Visit: Payer: Self-pay | Admitting: Family Medicine

## 2020-01-30 DIAGNOSIS — F4323 Adjustment disorder with mixed anxiety and depressed mood: Secondary | ICD-10-CM

## 2020-01-31 DIAGNOSIS — J4541 Moderate persistent asthma with (acute) exacerbation: Secondary | ICD-10-CM | POA: Diagnosis not present

## 2020-01-31 DIAGNOSIS — J4520 Mild intermittent asthma, uncomplicated: Secondary | ICD-10-CM | POA: Diagnosis not present

## 2020-02-13 ENCOUNTER — Ambulatory Visit (INDEPENDENT_AMBULATORY_CARE_PROVIDER_SITE_OTHER): Payer: Medicare HMO | Admitting: Psychology

## 2020-02-13 DIAGNOSIS — F4323 Adjustment disorder with mixed anxiety and depressed mood: Secondary | ICD-10-CM | POA: Diagnosis not present

## 2020-02-19 ENCOUNTER — Ambulatory Visit (INDEPENDENT_AMBULATORY_CARE_PROVIDER_SITE_OTHER): Payer: Medicare HMO | Admitting: Family Medicine

## 2020-02-19 ENCOUNTER — Other Ambulatory Visit: Payer: Self-pay

## 2020-02-19 ENCOUNTER — Encounter: Payer: Self-pay | Admitting: Family Medicine

## 2020-02-19 VITALS — BP 122/60 | HR 71 | Temp 97.1°F | Ht 65.0 in | Wt 215.5 lb

## 2020-02-19 DIAGNOSIS — R1013 Epigastric pain: Secondary | ICD-10-CM | POA: Diagnosis not present

## 2020-02-19 DIAGNOSIS — K582 Mixed irritable bowel syndrome: Secondary | ICD-10-CM | POA: Diagnosis not present

## 2020-02-19 DIAGNOSIS — R195 Other fecal abnormalities: Secondary | ICD-10-CM | POA: Insufficient documentation

## 2020-02-19 DIAGNOSIS — K219 Gastro-esophageal reflux disease without esophagitis: Secondary | ICD-10-CM

## 2020-02-19 LAB — CBC WITH DIFFERENTIAL/PLATELET
Basophils Absolute: 0 10*3/uL (ref 0.0–0.1)
Basophils Relative: 0.5 % (ref 0.0–3.0)
Eosinophils Absolute: 0.2 10*3/uL (ref 0.0–0.7)
Eosinophils Relative: 3.5 % (ref 0.0–5.0)
HCT: 38.5 % (ref 36.0–46.0)
Hemoglobin: 13 g/dL (ref 12.0–15.0)
Lymphocytes Relative: 29.4 % (ref 12.0–46.0)
Lymphs Abs: 1.7 10*3/uL (ref 0.7–4.0)
MCHC: 33.8 g/dL (ref 30.0–36.0)
MCV: 95.4 fl (ref 78.0–100.0)
Monocytes Absolute: 0.6 10*3/uL (ref 0.1–1.0)
Monocytes Relative: 10.6 % (ref 3.0–12.0)
Neutro Abs: 3.3 10*3/uL (ref 1.4–7.7)
Neutrophils Relative %: 56 % (ref 43.0–77.0)
Platelets: 284 10*3/uL (ref 150.0–400.0)
RBC: 4.04 Mil/uL (ref 3.87–5.11)
RDW: 14.6 % (ref 11.5–15.5)
WBC: 5.8 10*3/uL (ref 4.0–10.5)

## 2020-02-19 LAB — COMPREHENSIVE METABOLIC PANEL
ALT: 23 U/L (ref 0–35)
AST: 22 U/L (ref 0–37)
Albumin: 4.2 g/dL (ref 3.5–5.2)
Alkaline Phosphatase: 67 U/L (ref 39–117)
BUN: 12 mg/dL (ref 6–23)
CO2: 31 mEq/L (ref 19–32)
Calcium: 9.3 mg/dL (ref 8.4–10.5)
Chloride: 102 mEq/L (ref 96–112)
Creatinine, Ser: 0.91 mg/dL (ref 0.40–1.20)
GFR: 62.57 mL/min (ref >60.00)
Glucose, Bld: 97 mg/dL (ref 70–99)
Potassium: 4.1 mEq/L (ref 3.5–5.1)
Sodium: 140 mEq/L (ref 135–145)
Total Bilirubin: 0.6 mg/dL (ref 0.2–1.2)
Total Protein: 7.4 g/dL (ref 6.0–8.3)

## 2020-02-19 LAB — LIPASE: Lipase: 11 U/L (ref 11.0–59.0)

## 2020-02-19 MED ORDER — ALPRAZOLAM 0.5 MG PO TABS: 0.5000 mg | ORAL_TABLET | Freq: Every day | ORAL | 0 refills | Status: DC | PRN

## 2020-02-19 NOTE — Assessment & Plan Note (Signed)
With symptoms of gastritis  Mild tenderness /epigastric on exam Stool is light brown and heme neg today

## 2020-02-19 NOTE — Assessment & Plan Note (Signed)
Pt currently has gastritis symptoms  Rev last EGD  On omeprazole 40 -will inc to bid  Diet change/wt loss recommended

## 2020-02-19 NOTE — Assessment & Plan Note (Addendum)
Pain with bloating/belching and mild nausea  Scant heartburn (on omeprazole)  Also dark stool (heme neg today however)   Disc poss of gastritis  Lab today cbc ,cmet and lipase  Inc omeprazole to bid 40 mg  Bland diet  inst to avoid nsaids and also coffee Handouts given for gerd diet and gastritis  Update if not starting to improve in a week or if worsening   Consider H2 blocker and GI ref if no imp If worse or severe adv ER

## 2020-02-19 NOTE — Assessment & Plan Note (Signed)
We do not have copy of last colonoscopy  She thinks 2012  Dark stool lately with epig pain - heme neg today

## 2020-02-19 NOTE — Progress Notes (Signed)
Subjective:    Patient ID: Rachel Torres, female    DOB: 1956-02-06, 64 y.o.   MRN: [id number]   This visit occurred during the SARS-CoV-2 public health emergency.  Safety protocols were in place, including screening questions prior to the visit, additional usage of staff PPE, and extensive cleaning of exam room while observing appropriate contact time as indicated for disinfecting solutions.    HPI Pt presents for GI c/o and dark stool   Wt Readings from Last 3 Encounters:  02/19/20 215 lb 8 oz (97.8 kg)  10/09/19 209 lb 12.8 oz (95.2 kg)  09/10/19 213 lb 8 oz (96.8 kg)   35.86 kg/m   H/o GERD and IBS  This episode started on Friday  Stomach started hurting (upper abd) -uncomfortable to start  Also bloating   Mostly dull pain /pressure  A little bit of burning  Not severe  Known AAA  Very little heartburn -omeprazole keeps it in control   Causing burping/belching and gas -   mylanta helps some   She has taken a little aleve - 2 times  81 mg asa daily  No blood thinners at this point   Had eaten red pasta sauce and pizza - for several days/ did not help   Stools changed on Sunday  Dark / like coffee grounds / a little loose   No vomiting  A little nauseated   Had gallbladder out years ago   Lab Results  Component Value Date   ALT 18 06/07/2019   AST 20 06/07/2019   ALKPHOS 64 06/07/2019   BILITOT 0.5 06/07/2019    Lab Results  Component Value Date   CREATININE 0.88 06/07/2019   BUN 19 06/07/2019   NA 138 06/07/2019   K 4.1 06/07/2019   CL 103 06/07/2019   CO2 27 06/07/2019     Had colonoscopy in 2003 and 2012 10 year recall   Last EGD - has had multiple times  5/17 - small HH Few gastric polyps  No esophagitis   Has significant stress at home  Cares for husband Step daughter in jail/drug issues   Alcohol - 2-3 drinks per week  Coffee - trying to cut back (4-5 cups per day)   Patient Active Problem List   Diagnosis Date Noted  .  Epigastric pain 02/19/2020  . Dark stools 02/19/2020  . PAF (paroxysmal atrial fibrillation) ([place]) 02/05/2019  . HPV (human papilloma virus) infection 12/01/2016  . Screening mammogram, encounter for 11/29/2016  . Estrogen deficiency 11/29/2016  . Encounter for routine gynecological examination 11/29/2016  . Grade III hemorrhoids 10/06/2016  . Tachycardia 08/14/2016  . Hemorrhoids 08/13/2016  . Atypical migraine 08/03/2016  . CAD (coronary artery disease)   . AAA (abdominal aortic aneurysm) ([place])   . Chronic combined systolic (congestive) and diastolic (congestive) heart failure ([place])   . Prediabetes 02/11/2016  . Generalized anxiety disorder 12/08/2015  . Panic attacks 07/28/2015  . Sensorineural hearing loss 03/27/2013  . Vitamin D deficiency 07/03/2012  . H/O aortic dissection 05/12/2012  . Obesity (BMI 30.0-34.9) 11/21/2011  . Routine general medical examination at a health care facility 09/20/2011  . S/P AVR (aortic valve replacement) 01/07/2011  . CORONARY ARTERY BYPASS GRAFT, HX OF 01/07/2011  . Arthritis 12/20/2010  . H/O dissecting abdominal aortic aneurysm repair 08/24/2010  . IRRITABLE BOWEL SYNDROME 09/09/2009  . Obstructive sleep apnea 08/04/2009  . External hemorrhoid 06/26/2009  . Osteoporosis 01/09/2009  . Hyperlipidemia 07/26/2007  . Depression with anxiety 07/26/2007  .  Essential hypertension 07/26/2007  . Allergic rhinitis 07/26/2007  . Asthma 07/26/2007  . GERD 07/26/2007  . Osteoarthritis 07/26/2007  . Ocular migraine 07/26/2007  . Mild intermittent asthma without complication [id number]   Past Medical History:  Diagnosis Date  . AAA (abdominal aortic aneurysm) ([place])    a. Chronic w/o evidence of aneurysmal dil on CTA 01/2016.  Marland Kitchen Allergy   . Anemia   . Anxiety   . CAD (coronary artery disease)    a. 08/2010 s/p CABG x 1 (VG->RCA) @ time of Ao dissection repair; b. 01/2016 Lexiscan MV: mid antsept/apical defect w/ ? peri-infarct ischemia-->likely  attenuation-->Med Rx.  . Chronic combined systolic (congestive) and diastolic (congestive) heart failure ([place])    a. 2012 EF 30-35%; b. 04/2015 EF 40-45%; c. 01/2016 Echo: Ef 55-65%, nl AoV bioprosthesis, nl RV, nl PASP.  Marland Kitchen Depression   . Gallbladder sludge   . GERD (gastroesophageal reflux disease)   . History of Bicuspid Aortic Valve    a. 08/2010 s/p AVR @ time of Ao dissection repair; b. 01/2016 Echo: Ef 55-65%, nl AoV bioprosthesis, nl RV, nl PASP.  Marland Kitchen Hx of repair of dissecting thoracic aortic aneurysm, [place] type A    a. 08/2010 s/p repair with AVR and VG->RCA; b. 01/2016 CTA: stable appearance of Asc Thoracic Aortic graft. Opacification of flase lumen of chronic abd Ao dissection w/ retrograde flow through lumbar arteries. No aneurysmal dil of flase lumen.  . Hyperlipidemia   . Hypertensive heart disease   . IBS (irritable bowel syndrome)   . Internal hemorrhoids   . Marfan syndrome    pt denies  . Migraine   . Obstructive sleep apnea   . Osteoporosis   . Pneumonia   . RA (rheumatoid arthritis) ([place])    a. Followed by Dr. [name]   Past Surgical History:  Procedure Laterality Date  . ABDOMINAL AORTIC ANEURYSM REPAIR    . CARDIAC CATHETERIZATION  2001  . [place]  . CHOLECYSTECTOMY    . COLONOSCOPY    . FRACTURE SURGERY Right    shoulder replacement  . HEMORRHOID BANDING    . ORIF DISTAL RADIUS FRACTURE Left   . UPPER GASTROINTESTINAL ENDOSCOPY     Social History   Tobacco Use  . Smoking status: Former Smoker    Packs/day: 0.75    Years: 1.50    Pack years: 1.12    Types: Cigarettes    Quit date: 12/20/1978    Years since quitting: 41.1  . Smokeless tobacco: Never Used  Substance Use Topics  . Alcohol use: Yes    Alcohol/week: 0.0 standard drinks  . Drug use: No   Family History  Problem Relation Age of Onset  . Hypertension Mother   . Depression Mother   . Osteoporosis Mother   . Stroke Mother   . Irritable bowel syndrome Mother   .  Asthma Mother   . Arthritis Sister   . Diabetes Sister   . Heart disease Sister   . Asthma Sister   . Migraines Sister   . Nephrolithiasis Sister   . Osteoporosis Sister   . Arthritis Sister   . Asthma Sister   . Migraines Sister   . Other Father 41  . Stroke Maternal Grandmother   . Colon cancer Maternal Grandmother   . Lymphoma Maternal Grandmother   . Migraines Maternal Grandmother   . Aneurysm Paternal Grandmother        brain  . Diabetes Paternal Grandmother   .  Arthritis Paternal Grandmother   . Arthritis Paternal Grandfather   . Diabetes Paternal Grandfather   . Other Brother        prediabeties  . Asthma Brother   . Migraines Brother   . Lung cancer Maternal Grandfather   . Melanoma Maternal Grandfather    Allergies  Allergen Reactions  . Fosamax [Alendronate Sodium]     GI upset   . Ginzing [Ginseng] Other (See Comments)    Hyper and jitery   . Hydrocod Polst-Cpm Polst Er Itching  . Hydrocodone-Acetaminophen Itching  . Imitrex [Sumatriptan]     Contraindicated w/ heart condition  . Oxycodone Itching  . Peanut-Containing Drug Products Other (See Comments)    Reaction:  Migraines   . Tramadol Other (See Comments)    itching  . Hydrocodone-Guaifenesin Itching  . Prednisone Rash  . Tape Itching   Current Outpatient Medications on File Prior to Visit  Medication Sig Dispense Refill  . albuterol (PROVENTIL HFA;VENTOLIN HFA) 108 (90 Base) MCG/ACT inhaler Inhale 1-2 puffs into the lungs every 6 (six) hours as needed for wheezing or shortness of breath. 1 Inhaler 0  . aspirin EC 81 MG tablet Take 81 mg by mouth at bedtime.    Marland Kitchen atorvastatin (LIPITOR) 20 MG tablet TAKE 1 TABLET EVERY DAY 90 tablet 3  . carvedilol (COREG) 6.25 MG tablet TAKE 1 TABLET TWICE DAILY WITH MEALS 180 tablet 0  . Cholecalciferol (VITAMIN D3) 5000 units CAPS Take 1 capsule by mouth daily.    . cyclobenzaprine (FLEXERIL) 10 MG tablet Take 0.5-1 tablets (5-10 mg total) by mouth 3 (three)  times daily as needed. For headache 90 tablet 1  . furosemide (LASIX) 20 MG tablet TAKE 1 TABLET (20 MG TOTAL) BY MOUTH DAILY. NEED MD APPOINTMENT 90 tablet 3  . Krill Oil (OMEGA-3) 500 MG CAPS Take 1 capsule by mouth daily.    Marland Kitchen lisinopril (ZESTRIL) 5 MG tablet TAKE 1/2 TABLET EVERY DAY 45 tablet 2  . magnesium gluconate (MAGONATE) 500 MG tablet Take 500 mg by mouth at bedtime.    . Misc Natural Products (TART CHERRY ADVANCED) CAPS Take 1 capsule by mouth daily.    . naproxen sodium (ANAPROX) 220 MG tablet Take 440 mg by mouth 2 (two) times daily as needed (for headaches/pain).    . nitroGLYCERIN (NITROSTAT) 0.4 MG SL tablet Place 1 tablet (0.4 mg total) under the tongue every 5 (five) minutes as needed for chest pain. 25 tablet 3  . omeprazole (PRILOSEC) 40 MG capsule TAKE 1 CAPSULE EVERY DAY (Patient taking differently: Take 40 mg by mouth in the morning and at bedtime. ) 90 capsule 1  . potassium chloride (K-DUR) 10 MEQ tablet Take 1 tablet (10 mEq total) by mouth 2 (two) times daily as needed. 180 tablet 3  . promethazine (PHENERGAN) 25 MG tablet Take 0.5 tablets (12.5 mg total) by mouth every 8 (eight) hours as needed for nausea. 90 tablet 1  . Simethicone (MYLANTA GAS PO) Take by mouth 3 times/day as needed-between meals & bedtime.    Marland Kitchen venlafaxine XR (EFFEXOR-XR) 150 MG 24 hr capsule TAKE 1 CAPSULE EVERY DAY WITH BREAKFAST 90 capsule 1   No current facility-administered medications on file prior to visit.     Review of Systems  Constitutional: Negative for activity change, appetite change, fatigue, fever and unexpected weight change.  HENT: Negative for congestion, ear pain, rhinorrhea, sinus pressure and sore throat.   Eyes: Negative for pain, redness and visual disturbance.  Respiratory:  Negative for cough, shortness of breath and wheezing.   Cardiovascular: Negative for chest pain and palpitations.  Gastrointestinal: Positive for abdominal distention, abdominal pain and nausea.  Negative for anal bleeding, blood in stool, constipation, diarrhea, rectal pain and vomiting.  Endocrine: Negative for polydipsia and polyuria.  Genitourinary: Negative for dysuria, frequency and urgency.  Musculoskeletal: Negative for arthralgias, back pain and myalgias.  Skin: Negative for pallor and rash.  Allergic/Immunologic: Negative for environmental allergies.  Neurological: Negative for dizziness, syncope and headaches.  Hematological: Negative for adenopathy. Does not bruise/bleed easily.  Psychiatric/Behavioral: Negative for decreased concentration and dysphoric mood. The patient is not nervous/anxious.        Objective:   Physical Exam Constitutional:      General: She is not in acute distress.    Appearance: Normal appearance. She is well-developed. She is obese. She is not ill-appearing or diaphoretic.  HENT:     Head: Normocephalic and atraumatic.     Mouth/Throat:     Mouth: Mucous membranes are moist.  Eyes:     General: No scleral icterus.    Conjunctiva/sclera: Conjunctivae normal.     Pupils: Pupils are equal, round, and reactive to light.  Cardiovascular:     Rate and Rhythm: Normal rate and regular rhythm.     Heart sounds: Normal heart sounds.  Pulmonary:     Effort: Pulmonary effort is normal. No respiratory distress.     Breath sounds: Normal breath sounds. No wheezing or rales.  Abdominal:     General: Bowel sounds are normal. There is no distension.     Palpations: Abdomen is soft. There is no mass.     Tenderness: There is abdominal tenderness in the epigastric area. There is no right CVA tenderness, left CVA tenderness, guarding or rebound. Negative signs include Murphy's sign and McBurney's sign.     Hernia: No hernia is present.  Musculoskeletal:     Cervical back: Normal range of motion and neck supple. No tenderness.  Lymphadenopathy:     Cervical: No cervical adenopathy.  Skin:    General: Skin is warm and dry.     Coloration: Skin is not  pale.     Findings: No erythema.  Neurological:     Mental Status: She is alert.           Assessment & Plan:   Problem List Items Addressed This Visit      Digestive   GERD    Pt currently has gastritis symptoms  Rev last EGD  On omeprazole 40 -will inc to bid  Diet change/wt loss recommended      Relevant Medications   Simethicone (MYLANTA GAS PO)   IRRITABLE BOWEL SYNDROME    We do not have copy of last colonoscopy  She thinks 2012  Dark stool lately with epig pain - heme neg today      Relevant Medications   Simethicone (MYLANTA GAS PO)     Other   Epigastric pain - Primary    Pain with bloating/belching and mild nausea  Scant heartburn (on omeprazole)  Also dark stool (heme neg today however)   Disc poss of gastritis  Lab today cbc ,cmet and lipase  Inc omeprazole to bid 40 mg  Bland diet  inst to avoid nsaids and also coffee Handouts given for gerd diet and gastritis  Update if not starting to improve in a week or if worsening   Consider H2 blocker and GI ref if no imp If worse or  severe adv ER      Relevant Orders   CBC with Differential/Platelet   Comprehensive metabolic panel   Lipase   Dark stools    With symptoms of gastritis  Mild tenderness /epigastric on exam Stool is light brown and heme neg today

## 2020-02-19 NOTE — Patient Instructions (Signed)
Increase your omeprazole to 40 mg twice daily   Eat bland  Look at the handout about gastritis and diet with GERD  Avoid carbonation and sipping through straws -this puts more air in your gut resulting in gas   If no improvement in 3-4 days please let me know  We may need to consider trying something else and also GI visit   Labs today for cbc, cmet and lipase

## 2020-02-27 ENCOUNTER — Ambulatory Visit (INDEPENDENT_AMBULATORY_CARE_PROVIDER_SITE_OTHER): Payer: Medicare HMO | Admitting: Psychology

## 2020-02-27 DIAGNOSIS — F4323 Adjustment disorder with mixed anxiety and depressed mood: Secondary | ICD-10-CM

## 2020-03-03 ENCOUNTER — Telehealth: Payer: Self-pay | Admitting: Family Medicine

## 2020-03-03 NOTE — Chronic Care Management (AMB) (Signed)
°  Chronic Care Management   Note  03/03/2020 Name: Rachel Torres MRN: [id number] DOB: 1956-12-14  [name] is a 64 y.o. year old female who is a primary care patient of Tower, Wynelle Fanny, MD. I reached out to [employer] by phone today in response to a referral sent by [name]'s PCP, Tower, Wynelle Fanny, MD.   [name] was given information about Chronic Care Management services today including:  1. CCM service includes personalized support from designated clinical staff supervised by her physician, including individualized plan of care and coordination with other care providers 2. 24/7 contact phone numbers for assistance for urgent and routine care needs. 3. Service will only be billed when office clinical staff spend 20 minutes or more in a month to coordinate care. 4. Only one practitioner may furnish and bill the service in a calendar month. 5. The patient may stop CCM services at any time (effective at the end of the month) by phone call to the office staff.   Patient agreed to services and verbal consent obtained.   Follow up plan:   [name] [employer] Scheduler

## 2020-03-12 ENCOUNTER — Ambulatory Visit: Payer: Medicare HMO | Admitting: Psychology

## 2020-03-13 ENCOUNTER — Encounter: Payer: Self-pay | Admitting: Family Medicine

## 2020-03-13 DIAGNOSIS — E669 Obesity, unspecified: Secondary | ICD-10-CM

## 2020-03-16 NOTE — Telephone Encounter (Signed)
[facility] for [facility]  Will route to [facility]

## 2020-03-18 ENCOUNTER — Telehealth: Payer: Self-pay

## 2020-03-18 DIAGNOSIS — I10 Essential (primary) hypertension: Secondary | ICD-10-CM

## 2020-03-18 DIAGNOSIS — K219 Gastro-esophageal reflux disease without esophagitis: Secondary | ICD-10-CM

## 2020-03-18 NOTE — Telephone Encounter (Signed)
I would like to request a referral for Lenoria C Cangelosi to chronic care management pharmacy services for the following conditions:   Essential hypertension, benign  [I10]  GERD [K21.9]  Debbora Dus, PharmD Clinical Pharmacist [facility] [phone]

## 2020-03-19 NOTE — Telephone Encounter (Signed)
Referral placed.

## 2020-03-20 ENCOUNTER — Ambulatory Visit: Payer: Medicare HMO

## 2020-03-20 ENCOUNTER — Other Ambulatory Visit: Payer: Self-pay | Admitting: Cardiovascular Disease

## 2020-03-20 ENCOUNTER — Other Ambulatory Visit: Payer: Self-pay

## 2020-03-20 DIAGNOSIS — I5042 Chronic combined systolic (congestive) and diastolic (congestive) heart failure: Secondary | ICD-10-CM

## 2020-03-20 DIAGNOSIS — E7800 Pure hypercholesterolemia, unspecified: Secondary | ICD-10-CM

## 2020-03-20 DIAGNOSIS — I10 Essential (primary) hypertension: Secondary | ICD-10-CM

## 2020-03-20 DIAGNOSIS — K219 Gastro-esophageal reflux disease without esophagitis: Secondary | ICD-10-CM

## 2020-03-20 NOTE — Chronic Care Management (AMB) (Signed)
Chronic Care Management Pharmacy  Name: Rachel Torres  MRN: [id number] DOB: December 12, 1956  Chief Complaint/ HPI  Rachel Torres,  64 y.o., female presents for their Initial CCM visit with the clinical pharmacist via telephone.  PCP : [name], MD  Their chronic conditions include: hypertension, hyperlipidemia, CHF, pre-diabetes, AFIB, anxiety, depression, arthritis, GERD, atypical migraine, asthma, allergic rhinitis  Patient concerns: still having stomach pain, reflux, gas  Office Visits:  02/19/20: [facility] - Epigastric pain, dark stools; increase omeprazole to BID, possible gastritis; labs, bland diet, avoid NSAIDs, update if not improving in 1 week, then consider H2 blocker and GI referral   01/01/20: [facility] - neuropathy, cold toes, return is worsen or fail to improve   12/14/19: COVID-19 positive  06/20/19: [facility] - continue current medications  Consult Visit:  11/27/19: Pulmonology - Flora Lipps, continue albuterol as needed, no antibiotics recommended for bronchitis  09/10/19: [facility] - begin cardiac rehab, extra Lasix  07/09/19: Cardiology - Christell Faith, echo, rtc 6-12 months  Allergies  Allergen Reactions  . Fosamax [Alendronate Sodium]     GI upset   . Ginzing [Ginseng] Other (See Comments)    Hyper and jitery   . Hydrocod Polst-Cpm Polst Er Itching  . Hydrocodone-Acetaminophen Itching  . Imitrex [Sumatriptan]     Contraindicated w/ heart condition  . Oxycodone Itching  . Peanut-Containing Drug Products Other (See Comments)    Reaction:  Migraines   . Tramadol Other (See Comments)    itching  . Hydrocodone-Guaifenesin Itching  . Prednisone Rash  . Tape Itching   Medications: Outpatient Encounter Medications as of 03/20/2020  Medication Sig Note  . albuterol (PROVENTIL HFA;VENTOLIN HFA) 108 (90 Base) MCG/ACT inhaler Inhale 1-2 puffs into the lungs every 6 (six) hours as needed for wheezing or shortness of breath.   . ALPRAZolam (XANAX) 0.5 MG tablet Take 1  tablet (0.5 mg total) by mouth daily as needed for anxiety.   Marland Kitchen aspirin EC 81 MG tablet Take 81 mg by mouth at bedtime.   Marland Kitchen atorvastatin (LIPITOR) 20 MG tablet TAKE 1 TABLET EVERY DAY   . Cholecalciferol (VITAMIN D3) 5000 units CAPS Take 1 capsule by mouth daily.   . cyclobenzaprine (FLEXERIL) 10 MG tablet Take 0.5-1 tablets (5-10 mg total) by mouth 3 (three) times daily as needed. For headache   . furosemide (LASIX) 20 MG tablet TAKE 1 TABLET (20 MG TOTAL) BY MOUTH DAILY. NEED MD APPOINTMENT   . Krill Oil (OMEGA-3) 500 MG CAPS Take 1 capsule by mouth daily.   Marland Kitchen lisinopril (ZESTRIL) 5 MG tablet TAKE 1/2 TABLET EVERY DAY   . magnesium gluconate (MAGONATE) 500 MG tablet Take 500 mg by mouth at bedtime.   . Misc Natural Products (TART CHERRY ADVANCED) CAPS Take 1 capsule by mouth daily.   . naproxen sodium (ANAPROX) 220 MG tablet Take 440 mg by mouth 2 (two) times daily as needed (for headaches/pain). 10/06/2016: PRN   . nitroGLYCERIN (NITROSTAT) 0.4 MG SL tablet Place 1 tablet (0.4 mg total) under the tongue every 5 (five) minutes as needed for chest pain.   Marland Kitchen omeprazole (PRILOSEC) 40 MG capsule TAKE 1 CAPSULE EVERY DAY (Patient taking differently: Take 40 mg by mouth in the morning and at bedtime. )   . potassium chloride (K-DUR) 10 MEQ tablet Take 1 tablet (10 mEq total) by mouth 2 (two) times daily as needed.   . promethazine (PHENERGAN) 25 MG tablet Take 0.5 tablets (12.5 mg total) by mouth every 8 (  eight) hours as needed for nausea.   . Simethicone (MYLANTA GAS PO) Take by mouth 3 times/day as needed-between meals & bedtime.   Marland Kitchen venlafaxine XR (EFFEXOR-XR) 150 MG 24 hr capsule TAKE 1 CAPSULE EVERY DAY WITH BREAKFAST   . [DISCONTINUED] atorvastatin (LIPITOR) 20 MG tablet TAKE 1 TABLET EVERY DAY   . [DISCONTINUED] carvedilol (COREG) 6.25 MG tablet TAKE 1 TABLET TWICE DAILY WITH MEALS   . [DISCONTINUED] lisinopril (ZESTRIL) 5 MG tablet TAKE 1/2 TABLET EVERY DAY    No facility-administered  encounter medications on file as of 03/20/2020.   Current Diagnosis/Assessment:   Emergency planning/management officer Strain: Low Risk   . Difficulty of Paying Living Expenses: Not hard at all   Goals    . Patient Stated     Starting 06/05/19, I will continue to take medications as prescribed.     . Pharmacy Care Plan     CARE PLAN ENTRY  Current Barriers:  . Chronic Disease Management support, education, and care coordination needs related to diabetes, hypertension, hyperlipidemia, neuropathy, anxiety/sleep  Pharmacist Clinical Goal(s):  Marland Kitchen Over the next 6 months, patient will work with PharmD and primary care provider to address the following goals: o Acid Reflux: Improve reflux symptoms including abdominal pain and gas o Hypertension: Maintain blood pressure goal of less than 140/90 mmHg o Cholesterol: Maintain LDL cholesterol within goal of less than 70 mg/dL o Reduce cardiovascular risk with heart healthy diet and 150 minutes of exercise each week. o Heart Failure: Prevent shortness of breath and fluid retention o Vaccinations: Remain up to date on vaccinations. Recommend 2-dose series of shingles vaccine (Shingrix).  Interventions: . Comprehensive medication review performed . Recommend over the counter famotidine at lunch for breakthrough acid reflux  Patient Self Care Activities:  For the next 6 months until follow up visit:  . Continue to monitor blood pressure 1-2 days per week, call if persistently above 140/90 mmHg . Take atorvastatin 20 mg three days per week (Monday, Wed, Friday). . Check weight daily for fluid retention; take additional Lasix if weight gain of 3 or more pounds in 24 hours or 5 pounds in 1 week . May try famotidine over the counter, 1 tablet at lunch for acid reflux symptoms. Continue to limit foods that trigger symptoms. Follow up with Dr. Glori Bickers regarding GI referral.  . Consider increasing exercise with starting goal of 10 minutes per day 5 days per week  Initial  goal documentation      AFIB   CBC Latest Ref Rng & Units 02/19/2020 06/07/2019 01/21/2018  WBC 4.0 - 10.5 K/uL 5.8 4.8 5.6  Hemoglobin 12.0 - 15.0 g/dL 13.0 12.8 13.3  Hematocrit 36.0 - 46.0 % 38.5 37.4 38.7  Platelets 150.0 - 400.0 K/uL 284.0 248.0 259   Followed by Dr. [name] Patient is currently rate controlled. S/p ablation, artificial heart valve (bioprosthetic AVR) HR: 70-80    Per cardiology note: She was seen in the ED in 01/2018 and told she had Afib. She was prescribed Coumadin through the ED, though she did not start this. Upon her cardiologist reviewing the EKGs, she was noted to have sinus rhythm with PACs. There was no evidence of Afib.   Patient has failed these meds in past: none Patient is currently controlled on the following medications:  Carvedilol (COREG) 6.25 MG tablet - 1 tablet BID with meal  Magnesium gluconate (MAGONATE) 500 MG tablet - 1 tablet at bedtime  Aspirin 81 mg - 1 tablet daily  We discussed:  per cardiology, aspirin sufficient; CBC stable  Plan: Continue current medications  Asthma   Last spirometry score: 09/2019 FEV1 93%  Eosinophil count:   Lab Results  Component Value Date/Time   EOSPCT 3.5 02/19/2020 01:00 PM   EOSPCT 3.8 05/12/2012 12:12 PM  %                               Eos (Absolute):  Lab Results  Component Value Date/Time   EOSABS 0.2 02/19/2020 01:00 PM   EOSABS 0.2 05/12/2012 12:12 PM   Tobacco Status:  Social History   Tobacco Use  Smoking Status Former Smoker  . Packs/day: 0.75  . Years: 1.50  . Pack years: 1.12  . Types: Cigarettes  . Quit date: 12/20/1978  . Years since quitting: 41.3  Smokeless Tobacco Never Used   Patient has failed these meds in past: none Patient is currently controlled on the following medications:   Albuterol (PROVENTIL HFA;VENTOLIN HFA) 108 (90 Base) MCG/ACT inhaler - uses PRN respiratory illness  Using maintenance inhaler regularly? None prescribed Frequency of rescue inhaler  use:  infrequently  We discussed: reports bronchitis 1-2x a year, reports pulmonologist does not think she has asthma  Plan: Continue current medications  Heart Failure   Type: Combined Systolic and Diastolic Last ejection fraction: 07/2019 45-50% NYHA Class: II (slight limitation of activity) AHA HF Stage: C (Heart disease and symptoms present)  Patient has failed these meds in past: none Patient is currently controlled on the following medications:   furosemide (LASIX) 20 MG tablet - 1 tablet daily (up to TID PRN)  carvedilol (COREG) 6.25 MG tablet - 1 tablet BID  lisinopril (ZESTRIL) 5 MG tablet - 1 tablet daily  potassium chloride (K-DUR) 10 MEQ tablet - 1 tablet BID PRN  We discussed: swelling is usually in mid-section, takes furosemide TID PRN swelling; does not weigh daily; denies current swelling Reports some shortness of breath, worsened with acid reflux, SOB limits ability to do household chores  Plan: Continue current medications; Plans to complete weight loss program with [facility]. Refer to PCP for further GI evaluation/shortness of breath.   Hypertension   CMP Latest Ref Rng & Units 02/19/2020 06/07/2019 06/26/2018  Glucose 70 - 99 mg/dL 97 102(H) 112(H)  BUN 6 - 23 mg/dL 12 19 17   Creatinine 0.40 - 1.20 mg/dL 0.91 0.88 0.83  Sodium 135 - 145 mEq/L 140 138 140  Potassium 3.5 - 5.1 mEq/L 4.1 4.1 4.2  Chloride 96 - 112 mEq/L 102 103 105  CO2 19 - 32 mEq/L 31 27 28   Calcium 8.4 - 10.5 mg/dL 9.3 8.9 9.0  Total Protein 6.0 - 8.3 g/dL 7.4 6.7 6.9  Total Bilirubin 0.2 - 1.2 mg/dL 0.6 0.5 0.5  Alkaline Phos 39 - 117 U/L 67 64 58  AST 0 - 37 U/L 22 20 18   ALT 0 - 35 U/L 23 18 19    Office blood pressures are: BP Readings from Last 3 Encounters:  04/11/20 (!) 144/66  02/19/20 122/60  01/01/20 128/60   BP goal < 140/90 mmHg Patient has failed these meds in the past: none Patient checks BP at home 1-2x per week Patient home BP readings are ranging: 130s/80s, denies  lows  Patient is currently controlled on the following medications:   furosemide (LASIX) 20 MG tablet - 1 tablet up to TID  carvedilol (COREG) 6.25 MG tablet - 1 tablet BID  lisinopril (ZESTRIL) 5 MG  tablet - 1 tablet daily  potassium chloride (K-DUR) 10 MEQ tablet - 1 tablet BID PRN  Plan: Continue current medications  Hyperlipidemia/CAD   Lipid Panel     Component Value Date/Time   CHOL 137 06/07/2019 1038   CHOL 144 10/27/2015 0838   TRIG 108.0 06/07/2019 1038   TRIG 110 12/29/2006 1251   HDL 47.40 06/07/2019 1038   HDL 46 10/27/2015 0838   CHOLHDL 3 06/07/2019 1038   VLDL 21.6 06/07/2019 1038   LDLCALC 68 06/07/2019 1038   LDLCALC 81 10/27/2015 0838   LDLDIRECT 189.4 10/13/2011 1054   LABVLDL 17 10/27/2015 0838    LDL goal < 70 (CAD) Patient has failed these meds in past: none  Patient is currently controlled on the following medications:   aspirin EC 81 MG tablet - 1 tablet daily  Krill Oil (OMEGA-3) 500 MG CAPS - 1 capsule daily  atorvastatin (LIPITOR) 20 MG tablet - 1 tablet daily (reports taking for 2-3 weeks then off for a week, due to muscle pain)   nitroGLYCERIN (NITROSTAT) 0.4 MG SL tablet - has not needed recently, checks BP prior to using and only uses if BP is high  We discussed: Instead of skipping a week of atorvastatin sporadically, try taking every M,W,F  Plan: Continue current medications; Recommend taking atorvastatin on M,W,F.  Anxiety/Depression   Patient has failed these meds in past: none Patient is currently controlled on the following medications:   ALPRAZolam (XANAX) 0.5 MG tablet - takes sparingly  venlafaxine XR (EFFEXOR-XR) 150 MG 24 hr capsule - 1 capsule with breakfast  We discussed: sees therapist every 2 weeks; 30 tablets of Xanax lasts about a year, may take 1/2 tablet for sleep or 1/2 for stressful season  Plan: Continue current medications  GERD   Symptoms: chest pain, feels full, stomach bloated Patient has failed  these meds in past: none Patient is currently uncontrolled on the following medications:   omeprazole (PRILOSEC) 40 MG capsule - 1 capsule BID  Simethicone (MYLANTA GAS PO) - TID PRN  We discussed: Reports symptoms are still bothersome since increasing PPI to BID early March.Taking omeprazole before breakfast and before bedtime; taking Mylanta PRN (up to 2-3 time a day); usually evening meal is biggest meal of the day; symptoms are worse at night; would like GI referral, more chronic than ever been.   Food triggers: Avoiding tomato sauces and spicy foods, still drinking coffee Takes aspirin daily, denies other NSAIDs - will take occasional naproxen or Tylenol as needed  Plan: Continue current medications; Recommended adding OTC Pepcid at lunch.  Vitamin D Deficiency/Osteoporosis   DEXA 2018 Vit D: 06/07/19 70 Patient has failed these meds in past:  Patient is currently controlled on the following medications:   Cholecalciferol (VITAMIN D3) 5000 units CAPS - 1 capsule daily   We discussed: did not tolerate Fosamax; recommend daily calcium intake 1200 mg   Plan: Continue current medications   Arthritis   Patient has failed these meds in past: none reported Patient is currently controlled on the following medications:   Tylenol 325 mg - 1 tablet q4-6 hours PRN  Naproxen 220 mg - 1 tablet daily PRN  We discussed: occasional pain, well controlled  Plan: Continue current medications  Migraines    Patient has failed these meds in past: none Patient is currently controlled on the following medications:   cyclobenzaprine (FLEXERIL) 10 MG tablet - 1/2 to 1 tablet TID PRN headache  promethazine (PHENERGAN) 25 MG tablet -  1/2 tablet TID PRN nausea  We discussed: Yesterday took Flexeril and Promethazine for migraine - working okay, keeps migraine controlled; somewhat weather and stress related; may go 1-2 months without a migraine, sometimes up to 3-4 times a month  Plan:  Continue current medications   Vaccines   Reviewed and discussed patient's vaccination history.    Immunization History  Administered Date(s) Administered  . H1N1 01/09/2009  . Influenza Split 09/20/2011, 09/11/2012  . Influenza Whole 12/29/2006, 09/19/2008, 10/08/2010  . Influenza, Seasonal, Injecte, Preservative Fre 10/08/2010  . Influenza,inj,Quad PF,6+ Mos 10/08/2013, 08/13/2016, 10/27/2017, 10/05/2018, 09/17/2019  . Influenza-Unspecified 09/26/2015  . PFIZER SARS-COV-2 Vaccination 03/12/2020, 04/02/2020  . Pneumococcal Polysaccharide-23 12/29/2006  . Td 01/09/2009, 04/03/2019   Plan: Recommended patient receive Shingrix; Reports receiving second dose of COVID-19 vaccine April 02, 2020.   Medication Management  OTCs: Tart Cherry (inflammation) - 1 tablet daily; denies additional OTC items   Pharmacy/Benefits: [facility] (maintenance medications - no copay) [name] (acute meds) ([employer])  Adherence: pillbox  Affordability: no concerns  CCM Follow Up: 09/22/20 at 11:00 AM  (telephone)   Debbora Dus, PharmD Clinical Pharmacist [facility] [phone]

## 2020-03-26 ENCOUNTER — Ambulatory Visit (INDEPENDENT_AMBULATORY_CARE_PROVIDER_SITE_OTHER): Payer: Medicare HMO | Admitting: Psychology

## 2020-03-26 DIAGNOSIS — F4323 Adjustment disorder with mixed anxiety and depressed mood: Secondary | ICD-10-CM | POA: Diagnosis not present

## 2020-04-01 ENCOUNTER — Ambulatory Visit: Payer: Medicare HMO | Admitting: Cardiovascular Disease

## 2020-04-02 ENCOUNTER — Other Ambulatory Visit: Payer: Self-pay | Admitting: Cardiovascular Disease

## 2020-04-09 ENCOUNTER — Ambulatory Visit (INDEPENDENT_AMBULATORY_CARE_PROVIDER_SITE_OTHER): Payer: Medicare HMO | Admitting: Psychology

## 2020-04-09 DIAGNOSIS — F4323 Adjustment disorder with mixed anxiety and depressed mood: Secondary | ICD-10-CM

## 2020-04-11 ENCOUNTER — Encounter: Payer: Self-pay | Admitting: Family Medicine

## 2020-04-11 ENCOUNTER — Telehealth (INDEPENDENT_AMBULATORY_CARE_PROVIDER_SITE_OTHER): Payer: Medicare HMO | Admitting: Family Medicine

## 2020-04-11 ENCOUNTER — Other Ambulatory Visit: Payer: Self-pay

## 2020-04-11 VITALS — BP 144/66 | HR 70 | Temp 97.0°F

## 2020-04-11 DIAGNOSIS — R197 Diarrhea, unspecified: Secondary | ICD-10-CM

## 2020-04-11 DIAGNOSIS — R1013 Epigastric pain: Secondary | ICD-10-CM | POA: Diagnosis not present

## 2020-04-11 NOTE — Progress Notes (Signed)
Virtual Visit via Video Note  I connected with Rachel Torres on 04/11/20 at  2:00 PM EDT by a video enabled telemedicine application and verified that I am speaking with the correct person using two identifiers.  Location: Patient: home Provider: office    I discussed the limitations of evaluation and management by telemedicine and the availability of in person appointments. The patient expressed understanding and agreed to proceed.  Parties involved in encounter  Patient: Hydrologist  Provider:  Loura Pardon MD    History of Present Illness: Pt presents with c/o diarrhea for 3 d with nausea   Loose /watery stool  No abx recently  Cramps before  Frequent stools  Wakes her up in the middle of the night  No blood or dark stool  She is able to eat  Keeping up fluids   Gets otc diarrhea medicine -helps some (loperimide)  Does not tolerate pepto    More than usual IBS  Last thing she ate before this started    Nausea - no vomiting   No sick contacts Goes to church and doctor offices  No known exp to covid    Still has gastritis symptoms -only imp a bit with inc ppi Eats to help that    Had her covid shot 10 days ago (2nd shot)    Temp: (!) 97 F (36.1 C)    BP: (!) 144/66    She does eat probiotic foods     Patient Active Problem List   Diagnosis Date Noted  . Diarrhea 04/11/2020  . Epigastric pain 02/19/2020  . Dark stools 02/19/2020  . PAF (paroxysmal atrial fibrillation) ([place]) 02/05/2019  . HPV (human papilloma virus) infection 12/01/2016  . Screening mammogram, encounter for 11/29/2016  . Estrogen deficiency 11/29/2016  . Encounter for routine gynecological examination 11/29/2016  . Grade III hemorrhoids 10/06/2016  . Tachycardia 08/14/2016  . Hemorrhoids 08/13/2016  . Atypical migraine 08/03/2016  . CAD (coronary artery disease)   . AAA (abdominal aortic aneurysm) ([place])   . Chronic combined systolic (congestive) and diastolic  (congestive) heart failure ([place])   . Prediabetes 02/11/2016  . Generalized anxiety disorder 12/08/2015  . Panic attacks 07/28/2015  . Sensorineural hearing loss 03/27/2013  . Vitamin D deficiency 07/03/2012  . H/O aortic dissection 05/12/2012  . Obesity (BMI 30.0-34.9) 11/21/2011  . Routine general medical examination at a health care facility 09/20/2011  . S/P AVR (aortic valve replacement) 01/07/2011  . CORONARY ARTERY BYPASS GRAFT, HX OF 01/07/2011  . Arthritis 12/20/2010  . H/O dissecting abdominal aortic aneurysm repair 08/24/2010  . IRRITABLE BOWEL SYNDROME 09/09/2009  . Obstructive sleep apnea 08/04/2009  . External hemorrhoid 06/26/2009  . Osteoporosis 01/09/2009  . Hyperlipidemia 07/26/2007  . Depression with anxiety 07/26/2007  . Essential hypertension 07/26/2007  . Allergic rhinitis 07/26/2007  . Asthma 07/26/2007  . GERD 07/26/2007  . Osteoarthritis 07/26/2007  . Ocular migraine 07/26/2007  . Mild intermittent asthma without complication [id number]   Past Medical History:  Diagnosis Date  . AAA (abdominal aortic aneurysm) ([place])    a. Chronic w/o evidence of aneurysmal dil on CTA 01/2016.  Marland Kitchen Allergy   . Anemia   . Anxiety   . CAD (coronary artery disease)    a. 08/2010 s/p CABG x 1 (VG->RCA) @ time of Ao dissection repair; b. 01/2016 Lexiscan MV: mid antsept/apical defect w/ ? peri-infarct ischemia-->likely attenuation-->Med Rx.  . Chronic combined systolic (congestive) and diastolic (congestive) heart failure ([place])    a.  2012 EF 30-35%; b. 04/2015 EF 40-45%; c. 01/2016 Echo: Ef 55-65%, nl AoV bioprosthesis, nl RV, nl PASP.  Marland Kitchen Depression   . Gallbladder sludge   . GERD (gastroesophageal reflux disease)   . History of Bicuspid Aortic Valve    a. 08/2010 s/p AVR @ time of Ao dissection repair; b. 01/2016 Echo: Ef 55-65%, nl AoV bioprosthesis, nl RV, nl PASP.  Marland Kitchen Hx of repair of dissecting thoracic aortic aneurysm, [place] type A    a. 08/2010 s/p repair with AVR and  VG->RCA; b. 01/2016 CTA: stable appearance of Asc Thoracic Aortic graft. Opacification of flase lumen of chronic abd Ao dissection w/ retrograde flow through lumbar arteries. No aneurysmal dil of flase lumen.  . Hyperlipidemia   . Hypertensive heart disease   . IBS (irritable bowel syndrome)   . Internal hemorrhoids   . Marfan syndrome    pt denies  . Migraine   . Obstructive sleep apnea   . Osteoporosis   . Pneumonia   . RA (rheumatoid arthritis) ([place])    a. Followed by Dr. [name]   Past Surgical History:  Procedure Laterality Date  . ABDOMINAL AORTIC ANEURYSM REPAIR    . CARDIAC CATHETERIZATION  2001  . [place]  . CHOLECYSTECTOMY    . COLONOSCOPY    . FRACTURE SURGERY Right    shoulder replacement  . HEMORRHOID BANDING    . ORIF DISTAL RADIUS FRACTURE Left   . UPPER GASTROINTESTINAL ENDOSCOPY     Social History   Tobacco Use  . Smoking status: Former Smoker    Packs/day: 0.75    Years: 1.50    Pack years: 1.12    Types: Cigarettes    Quit date: 12/20/1978    Years since quitting: 41.3  . Smokeless tobacco: Never Used  Substance Use Topics  . Alcohol use: Yes    Alcohol/week: 0.0 standard drinks  . Drug use: No   Family History  Problem Relation Age of Onset  . Hypertension Mother   . Depression Mother   . Osteoporosis Mother   . Stroke Mother   . Irritable bowel syndrome Mother   . Asthma Mother   . Arthritis Sister   . Diabetes Sister   . Heart disease Sister   . Asthma Sister   . Migraines Sister   . Nephrolithiasis Sister   . Osteoporosis Sister   . Arthritis Sister   . Asthma Sister   . Migraines Sister   . Other Father 72  . Stroke Maternal Grandmother   . Colon cancer Maternal Grandmother   . Lymphoma Maternal Grandmother   . Migraines Maternal Grandmother   . Aneurysm Paternal Grandmother        brain  . Diabetes Paternal Grandmother   . Arthritis Paternal Grandmother   . Arthritis Paternal Grandfather   . Diabetes  Paternal Grandfather   . Other Brother        prediabeties  . Asthma Brother   . Migraines Brother   . Lung cancer Maternal Grandfather   . Melanoma Maternal Grandfather    Allergies  Allergen Reactions  . Fosamax [Alendronate Sodium]     GI upset   . Ginzing [Ginseng] Other (See Comments)    Hyper and jitery   . Hydrocod Polst-Cpm Polst Er Itching  . Hydrocodone-Acetaminophen Itching  . Imitrex [Sumatriptan]     Contraindicated w/ heart condition  . Oxycodone Itching  . Peanut-Containing Drug Products Other (See Comments)    Reaction:  Migraines   . Tramadol Other (See Comments)    itching  . Hydrocodone-Guaifenesin Itching  . Prednisone Rash  . Tape Itching   Current Outpatient Medications on File Prior to Visit  Medication Sig Dispense Refill  . albuterol (PROVENTIL HFA;VENTOLIN HFA) 108 (90 Base) MCG/ACT inhaler Inhale 1-2 puffs into the lungs every 6 (six) hours as needed for wheezing or shortness of breath. 1 Inhaler 0  . ALPRAZolam (XANAX) 0.5 MG tablet Take 1 tablet (0.5 mg total) by mouth daily as needed for anxiety. 30 tablet 0  . aspirin EC 81 MG tablet Take 81 mg by mouth at bedtime.    Marland Kitchen atorvastatin (LIPITOR) 20 MG tablet TAKE 1 TABLET EVERY DAY 90 tablet 0  . carvedilol (COREG) 6.25 MG tablet TAKE 1 TABLET TWICE DAILY WITH MEALS 180 tablet 0  . Cholecalciferol (VITAMIN D3) 5000 units CAPS Take 1 capsule by mouth daily.    . cyclobenzaprine (FLEXERIL) 10 MG tablet Take 0.5-1 tablets (5-10 mg total) by mouth 3 (three) times daily as needed. For headache 90 tablet 1  . furosemide (LASIX) 20 MG tablet TAKE 1 TABLET (20 MG TOTAL) BY MOUTH DAILY. NEED MD APPOINTMENT 90 tablet 3  . Krill Oil (OMEGA-3) 500 MG CAPS Take 1 capsule by mouth daily.    Marland Kitchen lisinopril (ZESTRIL) 5 MG tablet TAKE 1/2 TABLET EVERY DAY 45 tablet 0  . magnesium gluconate (MAGONATE) 500 MG tablet Take 500 mg by mouth at bedtime.    . Misc Natural Products (TART CHERRY ADVANCED) CAPS Take 1 capsule  by mouth daily.    . naproxen sodium (ANAPROX) 220 MG tablet Take 440 mg by mouth 2 (two) times daily as needed (for headaches/pain).    . nitroGLYCERIN (NITROSTAT) 0.4 MG SL tablet Place 1 tablet (0.4 mg total) under the tongue every 5 (five) minutes as needed for chest pain. 25 tablet 3  . omeprazole (PRILOSEC) 40 MG capsule TAKE 1 CAPSULE EVERY DAY (Patient taking differently: Take 40 mg by mouth in the morning and at bedtime. ) 90 capsule 1  . potassium chloride (K-DUR) 10 MEQ tablet Take 1 tablet (10 mEq total) by mouth 2 (two) times daily as needed. 180 tablet 3  . promethazine (PHENERGAN) 25 MG tablet Take 0.5 tablets (12.5 mg total) by mouth every 8 (eight) hours as needed for nausea. 90 tablet 1  . Simethicone (MYLANTA GAS PO) Take by mouth 3 times/day as needed-between meals & bedtime.    Marland Kitchen venlafaxine XR (EFFEXOR-XR) 150 MG 24 hr capsule TAKE 1 CAPSULE EVERY DAY WITH BREAKFAST 90 capsule 1   No current facility-administered medications on file prior to visit.   Review of Systems  Constitutional: Negative for chills, fever and malaise/fatigue.  HENT: Negative for congestion, ear pain, sinus pain and sore throat.   Eyes: Negative for blurred vision, discharge and redness.  Respiratory: Negative for cough, shortness of breath and stridor.   Cardiovascular: Negative for chest pain, palpitations and leg swelling.  Gastrointestinal: Positive for diarrhea and nausea. Negative for abdominal pain, blood in stool, constipation, melena and vomiting.  Musculoskeletal: Negative for myalgias.  Skin: Negative for rash.  Neurological: Negative for dizziness and headaches.    Observations/Objective: Patient appears well, in no distress She does seem fatigued  Weight is baseline  No facial swelling or asymmetry Normal voice-not hoarse and no slurred speech No obvious tremor or mobility impairment Moving neck and UEs normally Able to hear the call well  No cough or shortness of breath  during  interview  Talkative and mentally sharp with no cognitive changes No skin changes on face or neck , no rash or pallor, or jaundice Affect is normal    Assessment and Plan: Problem List Items Addressed This Visit      Other   Epigastric pain    Improved slightly with inc ppi dose Wishes to see GI  EGD may be necessary  Once she gets tested for covid (also has diarrhea) will ref to GI      Diarrhea    Diarrhea with nausea  No sick contacts  No s/s of dehydration  Takes occ loperamide-warned against overuse of this Adv bland diet/small portions  Good fluid intake  inst to get tested for covid (unlikely due to past covid and also immunized)  If no improvement after 3-5 more days will do stool studies She will call           Follow Up Instructions: Get tested for covid and isolate until result (call us with result) Rest/drink fluids Keep diet bland  Probiotics are ok  If suddenly worse go to ER or urgent care (especially if abdominal pain/fever or vomiting) Watch for signs of dehydration   If no improvement in diarrhea at the 5-7 day mark we may want to do some stool tests  I will place GI referral we discussed once covid test returns    I discussed the assessment and treatment plan with the patient. The patient was provided an opportunity to ask questions and all were answered. The patient agreed with the plan and demonstrated an understanding of the instructions.   The patient was advised to call back or seek an in-person evaluation if the symptoms worsen or if the condition fails to improve as anticipated.     Loura Pardon, MD

## 2020-04-11 NOTE — Patient Instructions (Signed)
Get tested for covid and isolate until result (call us with result) Rest/drink fluids Keep diet bland  Probiotics are ok  If suddenly worse go to ER or urgent care (especially if abdominal pain/fever or vomiting) Watch for signs of dehydration   If no improvement in diarrhea at the 5-7 day mark we may want to do some stool tests  I will place GI referral we discussed once covid test returns

## 2020-04-12 DIAGNOSIS — Z03818 Encounter for observation for suspected exposure to other biological agents ruled out: Secondary | ICD-10-CM | POA: Diagnosis not present

## 2020-04-12 DIAGNOSIS — Z20828 Contact with and (suspected) exposure to other viral communicable diseases: Secondary | ICD-10-CM | POA: Diagnosis not present

## 2020-04-12 NOTE — Assessment & Plan Note (Signed)
Improved slightly with inc ppi dose Wishes to see GI  EGD may be necessary  Once she gets tested for covid (also has diarrhea) will ref to GI

## 2020-04-12 NOTE — Assessment & Plan Note (Signed)
Diarrhea with nausea  No sick contacts  No s/s of dehydration  Takes occ loperamide-warned against overuse of this Adv bland diet/small portions  Good fluid intake  inst to get tested for covid (unlikely due to past covid and also immunized)  If no improvement after 3-5 more days will do stool studies She will call

## 2020-04-13 ENCOUNTER — Encounter: Payer: Self-pay | Admitting: Family Medicine

## 2020-04-13 DIAGNOSIS — R197 Diarrhea, unspecified: Secondary | ICD-10-CM

## 2020-04-13 DIAGNOSIS — R1013 Epigastric pain: Secondary | ICD-10-CM

## 2020-04-14 ENCOUNTER — Encounter: Payer: Self-pay | Admitting: Gastroenterology

## 2020-04-14 NOTE — Telephone Encounter (Signed)
Pt left v/m that pt spoke with Dr Glori Bickers on 04/11/20 about stomach and bowel issues. Pt thinks Dr Glori Bickers wants to do more testing. Pt said diarrhea continues; pt having pain in stomach that goes up into chest but pt thinks indigestion related due to if burps pain gets better. Pt request cb.

## 2020-04-14 NOTE — Telephone Encounter (Signed)
I ordered stool cx and c diff test-please have her come in for stool sample for those   I also placed a referral to GI for her epigastric/reflux issues (we discussed this in the past)   Will cc to [employer]

## 2020-04-15 ENCOUNTER — Telehealth (INDEPENDENT_AMBULATORY_CARE_PROVIDER_SITE_OTHER): Payer: Medicare HMO | Admitting: Gastroenterology

## 2020-04-15 ENCOUNTER — Encounter: Payer: Self-pay | Admitting: Gastroenterology

## 2020-04-15 ENCOUNTER — Other Ambulatory Visit: Payer: Self-pay

## 2020-04-15 DIAGNOSIS — K219 Gastro-esophageal reflux disease without esophagitis: Secondary | ICD-10-CM | POA: Diagnosis not present

## 2020-04-15 DIAGNOSIS — R197 Diarrhea, unspecified: Secondary | ICD-10-CM

## 2020-04-15 MED ORDER — GI COCKTAIL ~~LOC~~: 30.0000 mL | Freq: Three times a day (TID) | ORAL | 0 refills | Status: AC | PRN

## 2020-04-15 NOTE — Progress Notes (Signed)
Rachel Torres [address]  [place], [place] [address]  Main: [phone]  Fax: [phone]   Gastroenterology Consultation  Referring Provider:     [name], MD Primary Care Physician:  Tower, Wynelle Fanny, MD Reason for Consultation:    GERD        HPI:   Virtual Visit via Video Note  I connected with patient on 04/15/20 at  2:30 PM EDT by video ([email or web address]) and verified that I am speaking with the correct person using two identifiers.   I discussed the limitations, risks, security and privacy concerns of performing an evaluation and management service by video and the availability of in person appointments. I also discussed with the patient that there may be a patient responsible charge related to this service. The patient expressed understanding and agreed to proceed.  Location of the patient: Home Location of provider: Home Participating persons: Patient and provider only (Nursing staff checked in patient via phone but were not physically involved in the video interaction - see their notes)   History of Present Illness: Chief Complaint  Patient presents with  . New Patient (Initial Visit)  . Diarrhea    Patient states this is every day   . Abdominal Pain    Patient has epigastric pain with some nausea     Rachel Torres is a 64 y.o. y/o female referred for consultation & management  by Dr. Glori Bickers, Wynelle Fanny, MD.  Patient reports chronic history of reflux and takes PPI daily.  As of the last month has had exacerbation and omeprazole was increased to 40 mg twice daily.  Symptoms improved and patient decreased the medication but symptoms returned again as of the last 4 days and she has increase the medication again to 40 mg twice daily.  Last EGD in 2007 showed esophagitis, hiatal hernia, gastric polyp, pathology report not available.  No nausea or vomiting.  No weight loss.  Also reports associated left upper quadrant abdominal pain, dull, nonradiating,  5/10.  Has previously used NSAIDs which she has discontinued since start of this symptom  Also reports diarrhea that started 1 week ago.  PCP ordered stool cultures and C. difficile testing which patient has picked up stool containers for collection of stool today.  No blood in stool.  Reports last colonoscopy in 2014 in [place] was normal.  Past Medical History:  Diagnosis Date  . AAA (abdominal aortic aneurysm) ([place])    a. Chronic w/o evidence of aneurysmal dil on CTA 01/2016.  Marland Kitchen Allergy   . Anemia   . Anxiety   . CAD (coronary artery disease)    a. 08/2010 s/p CABG x 1 (VG->RCA) @ time of Ao dissection repair; b. 01/2016 Lexiscan MV: mid antsept/apical defect w/ ? peri-infarct ischemia-->likely attenuation-->Med Rx.  . Chronic combined systolic (congestive) and diastolic (congestive) heart failure ([place])    a. 2012 EF 30-35%; b. 04/2015 EF 40-45%; c. 01/2016 Echo: Ef 55-65%, nl AoV bioprosthesis, nl RV, nl PASP.  Marland Kitchen Depression   . Gallbladder sludge   . GERD (gastroesophageal reflux disease)   . History of Bicuspid Aortic Valve    a. 08/2010 s/p AVR @ time of Ao dissection repair; b. 01/2016 Echo: Ef 55-65%, nl AoV bioprosthesis, nl RV, nl PASP.  Marland Kitchen Hx of repair of dissecting thoracic aortic aneurysm, [place] type A    a. 08/2010 s/p repair with AVR and VG->RCA; b. 01/2016 CTA: stable appearance of Asc Thoracic Aortic graft.  Opacification of flase lumen of chronic abd Ao dissection w/ retrograde flow through lumbar arteries. No aneurysmal dil of flase lumen.  . Hyperlipidemia   . Hypertensive heart disease   . IBS (irritable bowel syndrome)   . Internal hemorrhoids   . Marfan syndrome    pt denies  . Migraine   . Obstructive sleep apnea   . Osteoporosis   . Pneumonia   . RA (rheumatoid arthritis) ([place])    a. Followed by Dr. [name]    Past Surgical History:  Procedure Laterality Date  . ABDOMINAL AORTIC ANEURYSM REPAIR    . CARDIAC CATHETERIZATION  2001  . [place]  . CHOLECYSTECTOMY    . COLONOSCOPY    . FRACTURE SURGERY Right    shoulder replacement  . HEMORRHOID BANDING    . ORIF DISTAL RADIUS FRACTURE Left   . UPPER GASTROINTESTINAL ENDOSCOPY      Prior to Admission medications   Medication Sig Start Date End Date Taking? Authorizing Provider  albuterol (PROVENTIL HFA;VENTOLIN HFA) 108 (90 Base) MCG/ACT inhaler Inhale 1-2 puffs into the lungs every 6 (six) hours as needed for wheezing or shortness of breath. 01/12/19  Yes Pleas Koch, NP  ALPRAZolam Duanne Moron) 0.5 MG tablet Take 1 tablet (0.5 mg total) by mouth daily as needed for anxiety. 02/19/20  Yes Tower, Wynelle Fanny, MD  aspirin EC 81 MG tablet Take 81 mg by mouth at bedtime.   Yes [provider]  atorvastatin (LIPITOR) 20 MG tablet TAKE 1 TABLET EVERY DAY 03/20/20  Yes Gollan, Kathlene November, MD  carvedilol (COREG) 6.25 MG tablet TAKE 1 TABLET TWICE DAILY WITH MEALS 04/02/20  Yes Gollan, Kathlene November, MD  Cholecalciferol (VITAMIN D3) 5000 units CAPS Take 1 capsule by mouth daily.   Yes [provider]  cyclobenzaprine (FLEXERIL) 10 MG tablet Take 0.5-1 tablets (5-10 mg total) by mouth 3 (three) times daily as needed. For headache 06/20/19  Yes Tower, Roque Lias A, MD  furosemide (LASIX) 20 MG tablet TAKE 1 TABLET (20 MG TOTAL) BY MOUTH DAILY. NEED MD APPOINTMENT 10/03/19  Yes Minna Merritts, MD  Javier Docker Oil (OMEGA-3) 500 MG CAPS Take 1 capsule by mouth daily.   Yes [provider]  lisinopril (ZESTRIL) 5 MG tablet TAKE 1/2 TABLET EVERY DAY 03/20/20  Yes Gollan, Kathlene November, MD  magnesium gluconate (MAGONATE) 500 MG tablet Take 500 mg by mouth at bedtime.   Yes [provider]  Misc Natural Products (TART CHERRY ADVANCED) CAPS Take 1 capsule by mouth daily.   Yes [provider]  naproxen sodium (ANAPROX) 220 MG tablet Take 440 mg by mouth 2 (two) times daily as needed (for headaches/pain).   Yes [provider]  nitroGLYCERIN (NITROSTAT) 0.4 MG SL tablet  Place 1 tablet (0.4 mg total) under the tongue every 5 (five) minutes as needed for chest pain. 04/26/17  Yes Gollan, Kathlene November, MD  omeprazole (PRILOSEC) 40 MG capsule TAKE 1 CAPSULE EVERY DAY Patient taking differently: Take 40 mg by mouth in the morning and at bedtime.  01/30/20  Yes Tower, Wynelle Fanny, MD  potassium chloride (K-DUR) 10 MEQ tablet Take 1 tablet (10 mEq total) by mouth 2 (two) times daily as needed. 09/10/19  Yes Gollan, Kathlene November, MD  promethazine (PHENERGAN) 25 MG tablet Take 0.5 tablets (12.5 mg total) by mouth every 8 (eight) hours as needed for nausea. 06/20/19  Yes Tower, Wynelle Fanny, MD  Simethicone (MYLANTA GAS PO) Take by  mouth 3 times/day as needed-between meals & bedtime.   Yes [provider]  venlafaxine XR (EFFEXOR-XR) 150 MG 24 hr capsule TAKE 1 CAPSULE EVERY DAY WITH BREAKFAST 01/30/20  Yes Tower, Wynelle Fanny, MD    Family History  Problem Relation Age of Onset  . Hypertension Mother   . Depression Mother   . Osteoporosis Mother   . Stroke Mother   . Irritable bowel syndrome Mother   . Asthma Mother   . Arthritis Sister   . Diabetes Sister   . Heart disease Sister   . Asthma Sister   . Migraines Sister   . Nephrolithiasis Sister   . Osteoporosis Sister   . Arthritis Sister   . Asthma Sister   . Migraines Sister   . Other Father 22  . Stroke Maternal Grandmother   . Colon cancer Maternal Grandmother   . Lymphoma Maternal Grandmother   . Migraines Maternal Grandmother   . Aneurysm Paternal Grandmother        brain  . Diabetes Paternal Grandmother   . Arthritis Paternal Grandmother   . Arthritis Paternal Grandfather   . Diabetes Paternal Grandfather   . Other Brother        prediabeties  . Asthma Brother   . Migraines Brother   . Lung cancer Maternal Grandfather   . Melanoma Maternal Grandfather      Social History   Tobacco Use  . Smoking status: Former Smoker    Packs/day: 0.75    Years: 1.50    Pack years: 1.12    Types: Cigarettes     Quit date: 12/20/1978    Years since quitting: 41.3  . Smokeless tobacco: Never Used  Substance Use Topics  . Alcohol use: Yes    Alcohol/week: 0.0 standard drinks  . Drug use: No    Allergies as of 04/15/2020 - Review Complete 04/15/2020  Allergen Reaction Noted  . Fosamax [alendronate sodium]  04/29/2017  . Ginzing [ginseng] Other (See Comments) 10/06/2016  . Hydrocod polst-cpm polst er Itching 09/20/2011  . Hydrocodone-acetaminophen Itching   . Imitrex [sumatriptan]  04/29/2017  . Oxycodone Itching 04/18/2015  . Peanut-containing drug products Other (See Comments)   . Tramadol Other (See Comments) 09/07/2017  . Hydrocodone-guaifenesin Itching 06/07/2013  . Prednisone Rash 07/26/2007  . Tape Itching 09/20/2011    Review of Systems:    All systems reviewed and negative except where noted in HPI.   Observations/Objective:  Labs: CBC    Component Value Date/Time   WBC 5.8 02/19/2020 1300   RBC 4.04 02/19/2020 1300   HGB 13.0 02/19/2020 1300   HGB 11.7 (L) 06/20/2013 0815   HCT 38.5 02/19/2020 1300   HCT 34.3 (L) 06/20/2013 0815   PLT 284.0 02/19/2020 1300   PLT 217 06/20/2013 0815   MCV 95.4 02/19/2020 1300   MCV 94 06/20/2013 0815   MCH 32.5 01/21/2018 1841   MCHC 33.8 02/19/2020 1300   RDW 14.6 02/19/2020 1300   RDW 13.2 06/20/2013 0815   LYMPHSABS 1.7 02/19/2020 1300   LYMPHSABS 1.4 05/12/2012 1212   MONOABS 0.6 02/19/2020 1300   MONOABS 0.4 05/12/2012 1212   EOSABS 0.2 02/19/2020 1300   EOSABS 0.2 05/12/2012 1212   BASOSABS 0.0 02/19/2020 1300   BASOSABS 0.0 05/12/2012 1212   CMP     Component Value Date/Time   NA 140 02/19/2020 1300   NA 139 09/02/2016 1440   NA 143 06/20/2013 0815   K 4.1 02/19/2020 1300   K 3.8 06/20/2013  0815   CL 102 02/19/2020 1300   CL 107 06/20/2013 0815   CO2 31 02/19/2020 1300   CO2 26 06/20/2013 0815   GLUCOSE 97 02/19/2020 1300   GLUCOSE 106 (H) 06/20/2013 0815   GLUCOSE 90 12/29/2006 1251   BUN 12 02/19/2020 1300     BUN 20 09/02/2016 1440   BUN 16 06/20/2013 0815   CREATININE 0.91 02/19/2020 1300   CREATININE 0.94 06/20/2013 0815   CREATININE 0.88 04/09/2011 0900   CALCIUM 9.3 02/19/2020 1300   CALCIUM 8.8 06/20/2013 0815   PROT 7.4 02/19/2020 1300   PROT 6.6 10/27/2015 0838   PROT 7.0 06/20/2013 0815   ALBUMIN 4.2 02/19/2020 1300   ALBUMIN 4.0 10/27/2015 0838   ALBUMIN 3.5 06/20/2013 0815   AST 22 02/19/2020 1300   AST 17 06/20/2013 0815   ALT 23 02/19/2020 1300   ALT 26 06/20/2013 0815   ALKPHOS 67 02/19/2020 1300   ALKPHOS 68 06/20/2013 0815   BILITOT 0.6 02/19/2020 1300   BILITOT 0.4 10/27/2015 0838   BILITOT 0.3 06/20/2013 0815   GFRNONAA >60 01/21/2018 1841   GFRNONAA >60 06/20/2013 0815   GFRAA >60 01/21/2018 1841   GFRAA >60 06/20/2013 0815    Imaging Studies: No results found.  Assessment and Plan:   Rachel Torres is a 64 y.o. y/o female has been referred for GERD, diarrhea Assessment and Plan: Diarrhea is acute, only started a week ago and patient had normal bowel movements before then.  Likely infectious etiology.  Patient encouraged to obtain stool and sent to lab as was ordered by PCP.  Oral and hand hygiene discussed  EGD indicated as patient is above 62 years of age, has chronic GERD and has had recent exacerbation of symptoms despite PPI use.  Evaluate for Barrett's  Patient is requesting GI cocktail as when heartburn starts, that helps.  Will prescribe short-term.  I have discussed alternative options, risks & benefits,  which include, but are not limited to, bleeding, infection, perforation,respiratory complication & drug reaction.  The patient agrees with this plan & written consent will be obtained.     Follow Up Instructions:   I discussed the assessment and treatment plan with the patient. The patient was provided an opportunity to ask questions and all were answered. The patient agreed with the plan and demonstrated an understanding of the  instructions.   The patient was advised to call back or seek an in-person evaluation if the symptoms worsen or if the condition fails to improve as anticipated.  I provided 15 minutes of face-to-face time via video software during this encounter.  Additional time was spent in reviewing patient's chart, placing orders etc.   Virgel Manifold, MD  Speech recognition software was used to dictate the above note.

## 2020-04-16 ENCOUNTER — Other Ambulatory Visit (INDEPENDENT_AMBULATORY_CARE_PROVIDER_SITE_OTHER): Payer: Medicare HMO

## 2020-04-16 ENCOUNTER — Ambulatory Visit (INDEPENDENT_AMBULATORY_CARE_PROVIDER_SITE_OTHER): Payer: Medicare HMO | Admitting: Psychology

## 2020-04-16 DIAGNOSIS — R197 Diarrhea, unspecified: Secondary | ICD-10-CM | POA: Diagnosis not present

## 2020-04-16 DIAGNOSIS — F4323 Adjustment disorder with mixed anxiety and depressed mood: Secondary | ICD-10-CM | POA: Diagnosis not present

## 2020-04-17 NOTE — Patient Instructions (Signed)
Dear Rachel Torres,  It was a pleasure meeting you during our initial appointment on March 20, 2020. Below is a summary of the goals we discussed and components of chronic care management. Please contact me anytime with questions or concerns.   Visit Information  Goals Addressed            This Visit's Progress   . Pharmacy Care Plan       CARE PLAN ENTRY  Current Barriers:  . Chronic Disease Management support, education, and care coordination needs related to diabetes, hypertension, hyperlipidemia, neuropathy, anxiety/sleep  Pharmacist Clinical Goal(s):  Marland Kitchen Over the next 6 months, patient will work with PharmD and primary care provider to address the following goals: o Acid Reflux: Improve reflux symptoms including abdominal pain and gas o Hypertension: Maintain blood pressure goal of less than 140/90 mmHg o Cholesterol: Maintain LDL cholesterol within goal of less than 70 mg/dL o Reduce cardiovascular risk with heart healthy diet and 150 minutes of exercise each week. o Heart Failure: Prevent shortness of breath and fluid retention o Vaccinations: Remain up to date on vaccinations. Recommend 2-dose series of shingles vaccine (Shingrix).  Interventions: . Comprehensive medication review performed . Recommend over the counter famotidine at lunch for breakthrough acid reflux  Patient Self Care Activities:  For the next 6 months until follow up visit:  . Continue to monitor blood pressure 1-2 days per week, call if persistently above 140/90 mmHg . Take atorvastatin 20 mg three days per week (Monday, Wed, Friday). . Check weight daily for fluid retention; take additional Lasix if weight gain of 3 or more pounds in 24 hours or 5 pounds in 1 week . May try famotidine over the counter, 1 tablet at lunch for acid reflux symptoms. Continue to limit foods that trigger symptoms. Follow up with Dr. Glori Bickers regarding GI referral.  . Consider increasing exercise with starting goal of 10  minutes per day 5 days per week  Initial goal documentation      Ms. Curlin was given information about Chronic Care Management services today including:  1. CCM service includes personalized support from designated clinical staff supervised by her physician, including individualized plan of care and coordination with other care providers 2. 24/7 contact phone numbers for assistance for urgent and routine care needs. 3. Standard insurance, coinsurance, copays and deductibles apply for chronic care management only during months in which we provide at least 20 minutes of these services. Most insurances cover these services at 100%, however patients may be responsible for any copay, coinsurance and/or deductible if applicable. This service may help you avoid the need for more expensive face-to-face services. 4. Only one practitioner may furnish and bill the service in a calendar month. 5. The patient may stop CCM services at any time (effective at the end of the month) by phone call to the office staff.  Patient agreed to services and verbal consent obtained.   The patient verbalized understanding of instructions provided today and agreed to receive a mailed copy of patient instruction and/or educational materials. Telephone follow up appointment with pharmacy team member scheduled for: 09/22/20 at 11:00 AM  (telephone)   Debbora Dus, PharmD Clinical Pharmacist [facility] [phone]   Gastroesophageal Reflux Disease, Adult Gastroesophageal reflux (GER) happens when acid from the stomach flows up into the tube that connects the mouth and the stomach (esophagus). Normally, food travels down the esophagus and stays in the stomach to be digested. With GER, food  and stomach acid sometimes move back up into the esophagus. You may have a disease called gastroesophageal reflux disease (GERD) if the reflux:  Happens often.  Causes frequent or very bad  symptoms.  Causes problems such as damage to the esophagus. When this happens, the esophagus becomes sore and swollen (inflamed). Over time, GERD can make small holes (ulcers) in the lining of the esophagus. What are the causes? This condition is caused by a problem with the muscle between the esophagus and the stomach. When this muscle is weak or not normal, it does not close properly to keep food and acid from coming back up from the stomach. The muscle can be weak because of:  Tobacco use.  Pregnancy.  Having a certain type of hernia (hiatal hernia).  Alcohol use.  Certain foods and drinks, such as coffee, chocolate, onions, and peppermint. What increases the risk? You are more likely to develop this condition if you:  Are overweight.  Have a disease that affects your connective tissue.  Use NSAID medicines. What are the signs or symptoms? Symptoms of this condition include:  Heartburn.  Difficult or painful swallowing.  The feeling of having a lump in the throat.  A bitter taste in the mouth.  Bad breath.  Having a lot of saliva.  Having an upset or bloated stomach.  Belching.  Chest pain. Different conditions can cause chest pain. Make sure you see your doctor if you have chest pain.  Shortness of breath or noisy breathing (wheezing).  Ongoing (chronic) cough or a cough at night.  Wearing away of the surface of teeth (tooth enamel).  Weight loss. How is this treated? Treatment will depend on how bad your symptoms are. Your doctor may suggest:  Changes to your diet.  Medicine.  Surgery. Follow these instructions at home: Eating and drinking   Follow a diet as told by your doctor. You may need to avoid foods and drinks such as: ? Coffee and tea (with or without caffeine). ? Drinks that contain alcohol. ? Energy drinks and sports drinks. ? Bubbly (carbonated) drinks or sodas. ? Chocolate and cocoa. ? Peppermint and mint flavorings. ? Garlic and  onions. ? Horseradish. ? Spicy and acidic foods. These include peppers, chili powder, curry powder, vinegar, hot sauces, and BBQ sauce. ? Citrus fruit juices and citrus fruits, such as oranges, lemons, and limes. ? Tomato-based foods. These include red sauce, chili, salsa, and pizza with red sauce. ? Fried and fatty foods. These include donuts, french fries, potato chips, and high-fat dressings. ? High-fat meats. These include hot dogs, rib eye steak, sausage, ham, and bacon. ? High-fat dairy items, such as whole milk, butter, and cream cheese.  Eat small meals often. Avoid eating large meals.  Avoid drinking large amounts of liquid with your meals.  Avoid eating meals during the 2-3 hours before bedtime.  Avoid lying down right after you eat.  Do not exercise right after you eat. Lifestyle   Do not use any products that contain nicotine or tobacco. These include cigarettes, e-cigarettes, and chewing tobacco. If you need help quitting, ask your doctor.  Try to lower your stress. If you need help doing this, ask your doctor.  If you are overweight, lose an amount of weight that is healthy for you. Ask your doctor about a safe weight loss goal. General instructions  Pay attention to any changes in your symptoms.  Take over-the-counter and prescription medicines only as told by your doctor. Do not take  aspirin, ibuprofen, or other NSAIDs unless your doctor says it is okay.  Wear loose clothes. Do not wear anything tight around your waist.  Raise (elevate) the head of your bed about 6 inches (15 cm).  Avoid bending over if this makes your symptoms worse.  Keep all follow-up visits as told by your doctor. This is important. Contact a doctor if:  You have new symptoms.  You lose weight and you do not know why.  You have trouble swallowing or it hurts to swallow.  You have wheezing or a cough that keeps happening.  Your symptoms do not get better with treatment.  You have  a hoarse voice. Get help right away if:  You have pain in your arms, neck, jaw, teeth, or back.  You feel sweaty, dizzy, or light-headed.  You have chest pain or shortness of breath.  You throw up (vomit) and your throw-up looks like blood or coffee grounds.  You pass out (faint).  Your poop (stool) is bloody or black.  You cannot swallow, drink, or eat. Summary  If a person has gastroesophageal reflux disease (GERD), food and stomach acid move back up into the esophagus and cause symptoms or problems such as damage to the esophagus.  Treatment will depend on how bad your symptoms are.  Follow a diet as told by your doctor.  Take all medicines only as told by your doctor. This information is not intended to replace advice given to you by your health care provider. Make sure you discuss any questions you have with your health care provider. Document Revised: 06/14/2018 Document Reviewed: 06/14/2018 Elsevier Patient Education  [place].

## 2020-04-20 LAB — C. DIFFICILE GDH AND TOXIN A/B
GDH ANTIGEN: NOT DETECTED
MICRO NUMBER:: 10416036
SPECIMEN QUALITY:: ADEQUATE
TOXIN A AND B: NOT DETECTED

## 2020-04-20 LAB — STOOL CULTURE
MICRO NUMBER:: 10417843
MICRO NUMBER:: 10417844
MICRO NUMBER:: 10417845
SHIGA RESULT:: NOT DETECTED
SPECIMEN QUALITY:: ADEQUATE
SPECIMEN QUALITY:: ADEQUATE
SPECIMEN QUALITY:: ADEQUATE

## 2020-04-21 NOTE — Progress Notes (Signed)
Cardiology Office Note  Date:  04/22/2020   ID:  Rachel Torres, DOB January 01, 1956, MRN [id number]  PCP:  [name], MD   Chief Complaint  Patient presents with  . other    6 month follow up. Meds reviewed by the pt. verbally. Pt. c/o chest pain and stomach problems with going for an upper GI on May 20th.     HPI:  Rachel Torres is a very pleasant 64 year-old woman who suffered an aorta type A dissection in September 2011   rushed to [facility] and underwent aortic valve replacement,  bypass of the right coronary artery with venous donor site from her right thigh,  aortic grafting for a extensive dissection of the  ascending aorta,  residual descending aortic dissection extending into the iliac artery,  EF 30 to 35,  up to 45 to 50% chronic back pain,  symptoms of shortness of breath,  abdominal pain,  hypertension  She reports pathology of her aortic valve showed bicuspid valve Chest pain episodes resolved by GI cocktail She presents today for follow-up of her aortic valve replacement and CABG.  In follow-up today reports she has been having abdominal distention,  Has EGD planned 5/20th,  Starting wellness program, would like to lose weight Weight continues to trend higher over the past year Patient's mother buys lots of cakes and desserts brings him home They also eat out frequently, [place] fries, fried damages  Other events, covid 11/2019 Got it from husband he spent time in rehab She has recovered relatively well  Echo 07/2019, results reviewed 1. The left ventricle has mildly reduced systolic function, with an  ejection fraction of 45-50%. The cavity size was mildly dilated. Left  ventricular diastolic Doppler parameters are consistent with  pseudonormalization. Left ventricular diffuse  hypokinesis.  2. The right ventricle has normal systolic function. The cavity was  normal. There is no increase in right ventricular wall thickness. Right  ventricular  systolic pressure is mildly elevated with an estimated  pressure of 31.3 mmHg.  3. Left atrial size was mildly dilated.  4. A bioprosthesis valve is present in the aortic position. Procedure  Date: 08/2010 Normal aortic valve prosthesis. Mean gradient 5.5 mm Hg    EKG personally reviewed by myself on todays visit Shows normal sinus rhythm rate 80 bpm nonspecific ST abnormality  Feb 2020: Flu A positive rhinovirus, [place]  Echo 07/2019, EF 45 to 50%  Lab work reviewed HBA1C 6.0  Lab Results  Component Value Date   CHOL 137 06/07/2019   HDL 47.40 06/07/2019   LDLCALC 68 06/07/2019   TRIG 108.0 06/07/2019    Other past medical history reviewed Previous  30 day monitor ordered for palpitations,  APCs and PVCs  diagnosed with obstructive sleep apnea. She has a CPAP machine but she is not using it.  Previous evaluations in the emergency room for chest pain. Cardiac enzymes were negative , EKG unchanged .She had a CT scan dated 06/20/2013. She was transferred to [facility] given her complicated anatomy. images were evaluated by vascular surgery it was determined there have been no significant progression of her disease  Previous evaluation at [facility] for shortness of breath and chest discomfort. She had workup for cardiac issues including echocardiogram and PET stress, CTA of the chest showing stable descending aorta repair with type B. aortic dissection with opacification of the false lumen at the level of the renal arteries via a lumbar artery.  Echocardiogram showed ejection fraction had improved  to 45-50%, mild MR, TR (ejection fraction improved from 30-35% in January 2012)  Previous H/o abdominal pain, further workup showing biliary stones and sludge. s/p  laparoscopic cholecystectomy at [facility].    PMH:   has a past medical history of AAA (abdominal aortic aneurysm) ([place]), Allergy, Anemia, Anxiety, CAD (coronary artery disease), Chronic combined systolic (congestive) and diastolic  (congestive) heart failure (HCC), Depression, Gallbladder sludge, GERD (gastroesophageal reflux disease), History of Bicuspid Aortic Valve, repair of dissecting thoracic aortic aneurysm, Stanford type A, Hyperlipidemia, Hypertensive heart disease, IBS (irritable bowel syndrome), Internal hemorrhoids, Marfan syndrome, Migraine, Obstructive sleep apnea, Osteoporosis, Pneumonia, and RA (rheumatoid arthritis) ([place]).  PSH:    Past Surgical History:  Procedure Laterality Date  . ABDOMINAL AORTIC ANEURYSM REPAIR    . CARDIAC CATHETERIZATION  2001  . [place]  . CHOLECYSTECTOMY    . COLONOSCOPY    . FRACTURE SURGERY Right    shoulder replacement  . HEMORRHOID BANDING    . ORIF DISTAL RADIUS FRACTURE Left   . UPPER GASTROINTESTINAL ENDOSCOPY      Current Outpatient Medications  Medication Sig Dispense Refill  . albuterol (PROVENTIL HFA;VENTOLIN HFA) 108 (90 Base) MCG/ACT inhaler Inhale 1-2 puffs into the lungs every 6 (six) hours as needed for wheezing or shortness of breath. 1 Inhaler 0  . ALPRAZolam (XANAX) 0.5 MG tablet Take 1 tablet (0.5 mg total) by mouth daily as needed for anxiety. 30 tablet 0  . Alum & Mag Hydroxide-Simeth (GI COCKTAIL) SUSP suspension Take 30 mLs by mouth 3 (three) times daily as needed for up to 10 days for indigestion. Shake well. 900 mL 0  . aspirin EC 81 MG tablet Take 81 mg by mouth at bedtime.    Marland Kitchen atorvastatin (LIPITOR) 20 MG tablet TAKE 1 TABLET EVERY DAY 90 tablet 0  . carvedilol (COREG) 6.25 MG tablet TAKE 1 TABLET TWICE DAILY WITH MEALS 180 tablet 0  . Cholecalciferol (VITAMIN D3) 5000 units CAPS Take 1 capsule by mouth daily.    . cyclobenzaprine (FLEXERIL) 10 MG tablet Take 0.5-1 tablets (5-10 mg total) by mouth 3 (three) times daily as needed. For headache 90 tablet 1  . furosemide (LASIX) 20 MG tablet TAKE 1 TABLET (20 MG TOTAL) BY MOUTH DAILY. NEED MD APPOINTMENT 90 tablet 3  . Krill Oil (OMEGA-3) 500 MG CAPS Take 1 capsule by mouth  daily.    Marland Kitchen lisinopril (ZESTRIL) 5 MG tablet TAKE 1/2 TABLET EVERY DAY 45 tablet 0  . magnesium gluconate (MAGONATE) 500 MG tablet Take 500 mg by mouth at bedtime.    . Misc Natural Products (TART CHERRY ADVANCED) CAPS Take 1 capsule by mouth daily.    . nitroGLYCERIN (NITROSTAT) 0.4 MG SL tablet Place 1 tablet (0.4 mg total) under the tongue every 5 (five) minutes as needed for chest pain. 25 tablet 3  . omeprazole (PRILOSEC) 40 MG capsule TAKE 1 CAPSULE EVERY DAY (Patient taking differently: Take 40 mg by mouth in the morning and at bedtime. ) 90 capsule 1  . potassium chloride (K-DUR) 10 MEQ tablet Take 1 tablet (10 mEq total) by mouth 2 (two) times daily as needed. 180 tablet 3  . promethazine (PHENERGAN) 25 MG tablet Take 0.5 tablets (12.5 mg total) by mouth every 8 (eight) hours as needed for nausea. 90 tablet 1  . Simethicone (MYLANTA GAS PO) Take by mouth 3 times/day as needed-between meals & bedtime.    Marland Kitchen venlafaxine XR (EFFEXOR-XR) 150 MG 24 hr capsule  TAKE 1 CAPSULE EVERY DAY WITH BREAKFAST 90 capsule 1  . naproxen sodium (ANAPROX) 220 MG tablet Take 440 mg by mouth 2 (two) times daily as needed (for headaches/pain).     No current facility-administered medications for this visit.     Allergies:   Fosamax [alendronate sodium], Ginzing [ginseng], Hydrocod polst-cpm polst er, Hydrocodone-acetaminophen, Imitrex [sumatriptan], Oxycodone, Peanut-containing drug products, Tramadol, Hydrocodone-guaifenesin, Prednisone, and Tape   Social History:  The patient  reports that she quit smoking about 41 years ago. Her smoking use included cigarettes. She has a 1.13 pack-year smoking history. She has never used smokeless tobacco. She reports current alcohol use. She reports that she does not use drugs.   Family History:   family history includes Aneurysm in her paternal grandmother; Arthritis in her paternal grandfather, paternal grandmother, sister, and sister; Asthma in her brother, mother,  sister, and sister; Colon cancer in her maternal grandmother; Depression in her mother; Diabetes in her paternal grandfather, paternal grandmother, and sister; Heart disease in her sister; Hypertension in her mother; Irritable bowel syndrome in her mother; Lung cancer in her maternal grandfather; Lymphoma in her maternal grandmother; Melanoma in her maternal grandfather; Migraines in her brother, maternal grandmother, sister, and sister; Nephrolithiasis in her sister; Osteoporosis in her mother and sister; Other in her brother; Other (age of onset: 17) in her father; Stroke in her maternal grandmother and mother.    Review of Systems: Review of Systems  Constitutional: Negative.   HENT: Negative.   Respiratory: Positive for shortness of breath.   Cardiovascular: Negative.   Gastrointestinal: Negative.   Musculoskeletal: Negative.   Neurological: Negative.   Psychiatric/Behavioral: Negative.   All other systems reviewed and are negative.    PHYSICAL EXAM: VS:  BP 120/70 (BP Location: Left Arm, Patient Position: Sitting, Cuff Size: Normal)   Ht 5\' 5"  (1.651 m)   Wt 215 lb 8 oz (97.8 kg)   SpO2 98%   BMI 35.86 kg/m  , BMI Body mass index is 35.86 kg/m. Constitutional:  oriented to person, place, and time. No distress.  Obese HENT:  Head: Grossly normal Eyes:  no discharge. No scleral icterus.  Neck: No JVD, no carotid bruits  Cardiovascular: Regular rate and rhythm, no murmurs appreciated Pulmonary/Chest: Clear to auscultation bilaterally, no wheezes or rails Abdominal: Soft.  no distension.  no tenderness.  Musculoskeletal: Normal range of motion Neurological:  normal muscle tone. Coordination normal. No atrophy Skin: Skin warm and dry Psychiatric: normal affect, pleasant  Recent Labs: 06/07/2019: TSH 0.76 02/19/2020: ALT 23; BUN 12; Creatinine, Ser 0.91; Hemoglobin 13.0; Platelets 284.0; Potassium 4.1; Sodium 140    Lipid Panel Lab Results  Component Value Date   CHOL 137  06/07/2019   HDL 47.40 06/07/2019   LDLCALC 68 06/07/2019   TRIG 108.0 06/07/2019      Wt Readings from Last 3 Encounters:  04/22/20 215 lb 8 oz (97.8 kg)  02/19/20 215 lb 8 oz (97.8 kg)  10/09/19 209 lb 12.8 oz (95.2 kg)     ASSESSMENT AND PLAN:  Pure hypercholesterolemia Cholesterol is at goal on the current lipid regimen. No changes to the medications were made.  Essential hypertension -  Blood pressure is well controlled on today's visit. No changes made to the medications.  CORONARY ARTERY BYPASS GRAFT Chronic stable angina Currently with no symptoms of angina. No further workup at this time. Continue current medication regimen.  Dissection of thoracoabdominal aorta (HCC) [place]in [place], Dr. Aurelio Jew  on recent eval , CT 07/2019, reviewed  H/O dissecting abdominal aortic aneurysm repair -  MRI 2020  Followed by vascular in [place]  Chronic diastolic CHF euvolemic  echo EF 45%  Abdominal pain Scheduled for EGD end of the month Recommend she cut back on fatty foods Already on PPI  Morbid obesity We have encouraged continued exercise, careful diet management in an effort to lose weight. Scheduled to meet with the wellness group in [facility]   Total encounter time more than 25 minutes  Greater than 50% was spent in counseling and coordination of care with the patient   Disposition:   F/U  6 months   Orders Placed This Encounter  Procedures  . EKG 12-Lead     Signed, Esmond Plants, M.D., Ph.D. 04/22/2020  [facility] [place], [place] [phone]

## 2020-04-22 ENCOUNTER — Encounter: Payer: Self-pay | Admitting: Cardiovascular Disease

## 2020-04-22 ENCOUNTER — Ambulatory Visit (INDEPENDENT_AMBULATORY_CARE_PROVIDER_SITE_OTHER): Payer: Medicare HMO | Admitting: Cardiovascular Disease

## 2020-04-22 ENCOUNTER — Other Ambulatory Visit: Payer: Self-pay

## 2020-04-22 VITALS — BP 120/70 | Ht 65.0 in | Wt 215.5 lb

## 2020-04-22 DIAGNOSIS — I480 Paroxysmal atrial fibrillation: Secondary | ICD-10-CM | POA: Diagnosis not present

## 2020-04-22 DIAGNOSIS — R0600 Dyspnea, unspecified: Secondary | ICD-10-CM

## 2020-04-22 DIAGNOSIS — I5042 Chronic combined systolic (congestive) and diastolic (congestive) heart failure: Secondary | ICD-10-CM | POA: Diagnosis not present

## 2020-04-22 DIAGNOSIS — I7103 Dissection of thoracoabdominal aorta: Secondary | ICD-10-CM | POA: Diagnosis not present

## 2020-04-22 DIAGNOSIS — Z952 Presence of prosthetic heart valve: Secondary | ICD-10-CM

## 2020-04-22 DIAGNOSIS — I2510 Atherosclerotic heart disease of native coronary artery without angina pectoris: Secondary | ICD-10-CM | POA: Diagnosis not present

## 2020-04-22 NOTE — Patient Instructions (Signed)

## 2020-04-23 ENCOUNTER — Encounter: Payer: Self-pay | Admitting: Family Medicine

## 2020-04-23 ENCOUNTER — Ambulatory Visit: Payer: Medicare HMO | Admitting: Psychology

## 2020-04-23 DIAGNOSIS — G43009 Migraine without aura, not intractable, without status migrainosus: Secondary | ICD-10-CM

## 2020-04-24 NOTE — Telephone Encounter (Signed)
PT ref  Sending to [facility]

## 2020-05-05 ENCOUNTER — Ambulatory Visit (INDEPENDENT_AMBULATORY_CARE_PROVIDER_SITE_OTHER): Payer: Medicare HMO | Admitting: Bariatrics

## 2020-05-05 ENCOUNTER — Encounter (INDEPENDENT_AMBULATORY_CARE_PROVIDER_SITE_OTHER): Payer: Self-pay | Admitting: Bariatrics

## 2020-05-05 ENCOUNTER — Other Ambulatory Visit: Payer: Self-pay

## 2020-05-05 VITALS — BP 119/70 | HR 85 | Temp 98.1°F | Ht 65.0 in | Wt 213.0 lb

## 2020-05-05 DIAGNOSIS — I5042 Chronic combined systolic (congestive) and diastolic (congestive) heart failure: Secondary | ICD-10-CM

## 2020-05-05 DIAGNOSIS — R0602 Shortness of breath: Secondary | ICD-10-CM

## 2020-05-05 DIAGNOSIS — Z6835 Body mass index (BMI) 35.0-35.9, adult: Secondary | ICD-10-CM

## 2020-05-05 DIAGNOSIS — I10 Essential (primary) hypertension: Secondary | ICD-10-CM | POA: Diagnosis not present

## 2020-05-05 DIAGNOSIS — E559 Vitamin D deficiency, unspecified: Secondary | ICD-10-CM

## 2020-05-05 DIAGNOSIS — R5383 Other fatigue: Secondary | ICD-10-CM

## 2020-05-05 DIAGNOSIS — G4733 Obstructive sleep apnea (adult) (pediatric): Secondary | ICD-10-CM | POA: Diagnosis not present

## 2020-05-05 DIAGNOSIS — E66812 Obesity, class 2: Secondary | ICD-10-CM

## 2020-05-05 DIAGNOSIS — Z1331 Encounter for screening for depression: Secondary | ICD-10-CM

## 2020-05-05 DIAGNOSIS — R7303 Prediabetes: Secondary | ICD-10-CM | POA: Diagnosis not present

## 2020-05-05 DIAGNOSIS — Z0289 Encounter for other administrative examinations: Secondary | ICD-10-CM

## 2020-05-05 NOTE — Progress Notes (Signed)
Dear Dr. Loura Pardon,   Thank you for referring Rachel Torres to our clinic. The following note includes my evaluation and treatment recommendations.  Chief Complaint:   OBESITY Rachel Torres (MR# [id number]) is a 64 y.o. female who presents for evaluation and treatment of obesity and related comorbidities. Current BMI is Body mass index is 35.45 kg/m.Marland Kitchen Rachel Torres has been struggling with her weight for many years and has been unsuccessful in either losing weight, maintaining weight loss, or reaching her healthy weight goal.  Rachel Torres is currently in the action stage of change and ready to dedicate time achieving and maintaining a healthier weight. Trenisha is interested in becoming our patient and working on intensive lifestyle modifications including (but not limited to) diet and exercise for weight loss.  Rachel Torres likes to cook, but notes pain from back and RA in hands as obstacles. She snacks on sweet and salty foods.  Rachel Torres's habits were reviewed today and are as follows: Her family eats meals together, she struggles with family and or coworkers weight loss sabotage, her desired weight loss is 58 lbs, she started gaining weight after menopause, her heaviest weight ever was 217 pounds, she craves salty, sweet, and chocolate, she frequently makes poor food choices, she has problems with excessive hunger, she frequently eats larger portions than normal, she has binge eating behaviors and she struggles with emotional eating.  Depression Screen Rachel Torres's Food and Mood (modified PHQ-9) score was 12.  Depression screen PHQ 2/9 05/05/2020  Decreased Interest 3  Down, Depressed, Hopeless 1  PHQ - 2 Score 4  Altered sleeping 2  Tired, decreased energy 3  Change in appetite 2  Feeling bad or failure about yourself  0  Trouble concentrating 0  Moving slowly or fidgety/restless 1  Suicidal thoughts 0  PHQ-9 Score 12  Difficult doing work/chores Somewhat difficult  Some recent data might  be hidden   Subjective:   Other fatigue. Rachel Torres denies daytime somnolence. Patent has a history of symptoms of morning headache. Rachel Torres generally gets 4-6 hours of sleep per night, and states that she does not sleep well most nights. Snoring is present. Apneic episodes are not present. Epworth Sleepiness Score is 7.  SOB (shortness of breath) on exertion. Rachel Torres notes increasing shortness of breath with certain activities and seems to be worsening over time with weight gain. She notes getting out of breath sooner with activity than she used to. This has gotten worse recently. Rachel Torres denies shortness of breath at rest or orthopnea.  Essential hypertension. Rachel Torres is taking carvedilol and lisinopril. Blood pressure is controlled.  BP Readings from Last 3 Encounters:  05/05/20 119/70  04/22/20 120/70  04/11/20 (!) 144/66   Lab Results  Component Value Date   CREATININE 0.91 02/19/2020   CREATININE 0.88 06/07/2019   CREATININE 0.83 06/26/2018   OSA (obstructive sleep apnea). Jannae reports mild OSA. She is not using CPAP.  Prediabetes. Rachel Torres has a diagnosis of prediabetes based on her elevated HgA1c and was informed this puts her at greater risk of developing diabetes. She continues to work on diet and exercise to decrease her risk of diabetes. She denies nausea or hypoglycemia. Harmonii is on no medication.  Lab Results  Component Value Date   HGBA1C 6.0 06/07/2019   No results found for: INSULIN  Vitamin D deficiency. Rachel Torres is taking Vitamin D supplementation. Last Vitamin D 69.53 on 06/07/2019.  Chronic combined systolic and diastolic congestive heart failure ([place]). Rachel Torres has a history of coronary  artery disease.  Depression screening. Rachel Torres had a moderately positive depression screen with a PHQ-9 score of 12.  Assessment/Plan:   Other fatigue. Rachel Torres does feel that her weight is causing her energy to be lower than it should be. Fatigue may be related to obesity,  depression or many other causes. Labs will be ordered, and in the meanwhile, Lockie will focus on self care including making healthy food choices, increasing physical activity and focusing on stress reduction. T3, T4, free, TSH ordered today.  SOB (shortness of breath) on exertion. Rachel Torres does feel that she gets out of breath more easily that she used to when she exercises. Rachel Torres's shortness of breath appears to be obesity related and exercise induced. She has agreed to work on weight loss and gradually increase exercise to treat her exercise induced shortness of breath. Will continue to monitor closely. T3, T4, free, TSH ordered today.  Essential hypertension. Rachel Torres is working on healthy weight loss and exercise to improve blood pressure control. We will watch for signs of hypotension as she continues her lifestyle modifications. She will continue her medications as directed.  OSA (obstructive sleep apnea). Intensive lifestyle modifications are the first line treatment for this issue. We discussed several lifestyle modifications today and she will continue to work on diet, exercise and weight loss efforts. We will continue to monitor. Orders and follow up as documented in patient record. We discussed the importance of good sleep.  Counseling  Sleep apnea is a condition in which breathing pauses or becomes shallow during sleep. This happens over and over during the night. This disrupts your sleep and keeps your body from getting the rest that it needs, which can cause tiredness and lack of energy (fatigue) during the day.  Sleep apnea treatment: If you were given a device to open your airway while you sleep, USE IT!  Sleep hygiene:   Limit or avoid alcohol, caffeinated beverages, and cigarettes, especially close to bedtime.   Do not eat a large meal or eat spicy foods right before bedtime. This can lead to digestive discomfort that can make it hard for you to sleep.  Keep a sleep diary to  help you and your health care provider figure out what could be causing your insomnia.  . Make your bedroom a dark, comfortable place where it is easy to fall asleep. ? Put up shades or blackout curtains to block light from outside. ? Use a white noise machine to block noise. ? Keep the temperature cool. . Limit screen use before bedtime. This includes: ? Watching TV. ? Using your smartphone, tablet, or computer. . Stick to a routine that includes going to bed and waking up at the same times every day and night. This can help you fall asleep faster. Consider making a quiet activity, such as reading, part of your nighttime routine. . Try to avoid taking naps during the day so that you sleep better at night. . Get out of bed if you are still awake after 15 minutes of trying to sleep. Keep the lights down, but try reading or doing a quiet activity. When you feel sleepy, go back to bed.  Prediabetes. Rachel Torres will continue to work on weight loss, exercise, and decreasing simple carbohydrates to help decrease the risk of diabetes.  Hemoglobin A1c, Insulin, random, Lipid Panel With LDL/HDL Ratio labs ordered today.  Vitamin D deficiency. Low Vitamin D level contributes to fatigue and are associated with obesity, breast, and colon cancer. VITAMIN D 25 Hydroxy (  Vit-D Deficiency, Fractures) level ordered today.  Chronic combined systolic and diastolic congestive heart failure ([place]). Rachel Torres will follow-up with her PCP and cardiologist as scheduled.  Depression screening. Rachel Torres had a positive depression screening. Depression is commonly associated with obesity and often results in emotional eating behaviors. We will monitor this closely and work on CBT to help improve the non-hunger eating patterns. Referral to Psychology may be required if no improvement is seen as she continues in our clinic.  Class 2 severe obesity with serious comorbidity and body mass index (BMI) of 35.0 to 35.9 in adult, unspecified  obesity type (Rachel Torres).  Rachel Torres is currently in the action stage of change and her goal is to continue with weight loss efforts. I recommend Rachel Torres begin the structured treatment plan as follows:  She has agreed to the Category 1 Plan.  She will work on meal planning, intentional eating, and controlling portion sizes.  We independently reviewed with the patient labs from 02/19/2020 including CMP, CBC, and glucose.  Exercise goals: All adults should avoid inactivity. Some physical activity is better than none, and adults who participate in any amount of physical activity gain some health benefits.   Behavioral modification strategies: increasing lean protein intake, decreasing simple carbohydrates, increasing vegetables, increasing water intake, decreasing eating out, no skipping meals, meal planning and cooking strategies, keeping healthy foods in the home and planning for success.  She was informed of the importance of frequent follow-up visits to maximize her success with intensive lifestyle modifications for her multiple health conditions. She was informed we would discuss her lab results at her next visit unless there is a critical issue that needs to be addressed sooner. Rachel Torres agreed to keep her next visit at the agreed upon time to discuss these results.  Objective:   Blood pressure 119/70, pulse 85, temperature 98.1 F (36.7 C), height 5\' 5"  (1.651 m), weight 213 lb (96.6 kg), SpO2 96 %. Body mass index is 35.45 kg/m.  EKG: Not done.  Indirect Calorimeter completed today shows a VO2 of 143 and a REE of 993.  Her calculated basal metabolic rate is 0000000 thus her basal metabolic rate is worse than expected.  General: Cooperative, alert, well developed, in no acute distress. HEENT: Conjunctivae and lids unremarkable. Cardiovascular: Regular rhythm.  Lungs: Normal work of breathing. Neurologic: No focal deficits.   Lab Results  Component Value Date   CREATININE 0.91 02/19/2020    BUN 12 02/19/2020   NA 140 02/19/2020   K 4.1 02/19/2020   CL 102 02/19/2020   CO2 31 02/19/2020   Lab Results  Component Value Date   ALT 23 02/19/2020   AST 22 02/19/2020   ALKPHOS 67 02/19/2020   BILITOT 0.6 02/19/2020   Lab Results  Component Value Date   HGBA1C 6.0 06/07/2019   HGBA1C 6.1 06/26/2018   HGBA1C 6.0 11/24/2017   HGBA1C 5.5 11/23/2016   HGBA1C 5.3 07/12/2016   No results found for: INSULIN Lab Results  Component Value Date   TSH 0.76 06/07/2019   Lab Results  Component Value Date   CHOL 137 06/07/2019   HDL 47.40 06/07/2019   LDLCALC 68 06/07/2019   LDLDIRECT 189.4 10/13/2011   TRIG 108.0 06/07/2019   CHOLHDL 3 06/07/2019   Lab Results  Component Value Date   WBC 5.8 02/19/2020   HGB 13.0 02/19/2020   HCT 38.5 02/19/2020   MCV 95.4 02/19/2020   PLT 284.0 02/19/2020   Lab Results  Component Value Date  FERRITIN 105.0 12/11/2010   Obesity Behavioral Intervention Visit Documentation for Insurance:   Approximately 15 minutes were spent on the discussion below.  ASK: We discussed the diagnosis of obesity with Domonic today and Latandra agreed to give [place] permission to discuss obesity behavioral modification therapy today.  ASSESS: Melda has the diagnosis of obesity and her BMI today is 35.5. Marquasia is in the action stage of change.   ADVISE: Kasumi was educated on the multiple health risks of obesity as well as the benefit of weight loss to improve her health. She was advised of the need for long term treatment and the importance of lifestyle modifications to improve her current health and to decrease her risk of future health problems.  AGREE: Multiple dietary modification options and treatment options were discussed and Elvin agreed to follow the recommendations documented in the above note.  ARRANGE: Betheny was educated on the importance of frequent visits to treat obesity as outlined per CMS and USPSTF guidelines and agreed to  schedule her next follow up appointment today.  Attestation Statements:   Reviewed by clinician on day of visit: allergies, medications, problem list, medical history, surgical history, family history, social history, and previous encounter notes.  Migdalia Dk, am acting as Location manager for [employer], DO   I have reviewed the above documentation for accuracy and completeness, and I agree with the above. Jearld Lesch, DO

## 2020-05-06 ENCOUNTER — Other Ambulatory Visit
Admission: RE | Admit: 2020-05-06 | Discharge: 2020-05-06 | Disposition: A | Discharge status: Home / Self Care | Payer: Medicare HMO | Source: Ambulatory Visit | Attending: Gastroenterology | Admitting: Gastroenterology

## 2020-05-06 DIAGNOSIS — Z01812 Encounter for preprocedural laboratory examination: Secondary | ICD-10-CM | POA: Diagnosis not present

## 2020-05-06 DIAGNOSIS — Z20822 Contact with and (suspected) exposure to covid-19: Secondary | ICD-10-CM | POA: Insufficient documentation

## 2020-05-06 LAB — LIPID PANEL WITH LDL/HDL RATIO
Cholesterol, Total: 195 mg/dL (ref 100–199)
HDL: 56 mg/dL (ref >39)
LDL Chol Calc (NIH): 116 mg/dL — ABNORMAL HIGH (ref 0–99)
LDL/HDL Ratio: 2.1 ratio (ref 0.0–3.2)
Triglycerides: 133 mg/dL (ref 0–149)
VLDL Cholesterol Cal: 23 mg/dL (ref 5–40)

## 2020-05-06 LAB — T4, FREE: Free T4: 0.94 ng/dL (ref 0.82–1.77)

## 2020-05-06 LAB — T3: T3, Total: 106 ng/dL (ref 71–180)

## 2020-05-06 LAB — HEMOGLOBIN A1C
Est. average glucose Bld gHb Est-mCnc: 123 mg/dL
Hgb A1c MFr Bld: 5.9 % — ABNORMAL HIGH (ref 4.8–5.6)

## 2020-05-06 LAB — SARS CORONAVIRUS 2 (TAT 6-24 HRS): SARS Coronavirus 2: NEGATIVE

## 2020-05-06 LAB — TSH: TSH: 1.04 u[IU]/mL (ref 0.450–4.500)

## 2020-05-06 LAB — VITAMIN D 25 HYDROXY (VIT D DEFICIENCY, FRACTURES): Vit D, 25-Hydroxy: 53.9 ng/mL (ref 30.0–100.0)

## 2020-05-06 LAB — INSULIN, RANDOM: INSULIN: 25.2 u[IU]/mL — ABNORMAL HIGH (ref 2.6–24.9)

## 2020-05-07 ENCOUNTER — Ambulatory Visit (INDEPENDENT_AMBULATORY_CARE_PROVIDER_SITE_OTHER): Payer: Medicare HMO | Admitting: Psychology

## 2020-05-07 DIAGNOSIS — F4323 Adjustment disorder with mixed anxiety and depressed mood: Secondary | ICD-10-CM | POA: Diagnosis not present

## 2020-05-08 ENCOUNTER — Ambulatory Visit: Payer: Medicare HMO | Admitting: Anesthesiology

## 2020-05-08 ENCOUNTER — Ambulatory Visit
Admission: RE | Admit: 2020-05-08 | Discharge: 2020-05-08 | Disposition: A | Discharge status: Home / Self Care | Payer: Medicare HMO | Attending: Gastroenterology | Admitting: Gastroenterology

## 2020-05-08 ENCOUNTER — Encounter
Admission: RE | Disposition: A | Discharge status: Home / Self Care | Payer: Self-pay | Source: Home / Self Care | Attending: Gastroenterology

## 2020-05-08 ENCOUNTER — Other Ambulatory Visit: Payer: Self-pay

## 2020-05-08 ENCOUNTER — Encounter: Payer: Self-pay | Admitting: Gastroenterology

## 2020-05-08 DIAGNOSIS — F419 Anxiety disorder, unspecified: Secondary | ICD-10-CM | POA: Diagnosis not present

## 2020-05-08 DIAGNOSIS — K2100 Gastro-esophageal reflux disease with esophagitis, without bleeding: Secondary | ICD-10-CM | POA: Diagnosis not present

## 2020-05-08 DIAGNOSIS — Z79899 Other long term (current) drug therapy: Secondary | ICD-10-CM | POA: Diagnosis not present

## 2020-05-08 DIAGNOSIS — G4733 Obstructive sleep apnea (adult) (pediatric): Secondary | ICD-10-CM | POA: Diagnosis not present

## 2020-05-08 DIAGNOSIS — Z7982 Long term (current) use of aspirin: Secondary | ICD-10-CM | POA: Diagnosis not present

## 2020-05-08 DIAGNOSIS — Z951 Presence of aortocoronary bypass graft: Secondary | ICD-10-CM | POA: Insufficient documentation

## 2020-05-08 DIAGNOSIS — F418 Other specified anxiety disorders: Secondary | ICD-10-CM | POA: Diagnosis not present

## 2020-05-08 DIAGNOSIS — I2510 Atherosclerotic heart disease of native coronary artery without angina pectoris: Secondary | ICD-10-CM | POA: Insufficient documentation

## 2020-05-08 DIAGNOSIS — Z87891 Personal history of nicotine dependence: Secondary | ICD-10-CM | POA: Diagnosis not present

## 2020-05-08 DIAGNOSIS — M069 Rheumatoid arthritis, unspecified: Secondary | ICD-10-CM | POA: Diagnosis not present

## 2020-05-08 DIAGNOSIS — M810 Age-related osteoporosis without current pathological fracture: Secondary | ICD-10-CM | POA: Diagnosis not present

## 2020-05-08 DIAGNOSIS — K3189 Other diseases of stomach and duodenum: Secondary | ICD-10-CM

## 2020-05-08 DIAGNOSIS — K219 Gastro-esophageal reflux disease without esophagitis: Secondary | ICD-10-CM

## 2020-05-08 DIAGNOSIS — I5042 Chronic combined systolic (congestive) and diastolic (congestive) heart failure: Secondary | ICD-10-CM | POA: Insufficient documentation

## 2020-05-08 DIAGNOSIS — K317 Polyp of stomach and duodenum: Secondary | ICD-10-CM | POA: Insufficient documentation

## 2020-05-08 DIAGNOSIS — Q402 Other specified congenital malformations of stomach: Secondary | ICD-10-CM | POA: Insufficient documentation

## 2020-05-08 DIAGNOSIS — F329 Major depressive disorder, single episode, unspecified: Secondary | ICD-10-CM | POA: Diagnosis not present

## 2020-05-08 DIAGNOSIS — I110 Hypertensive heart disease with heart failure: Secondary | ICD-10-CM | POA: Diagnosis not present

## 2020-05-08 HISTORY — PX: ESOPHAGOGASTRODUODENOSCOPY (EGD) WITH PROPOFOL: SHX5813

## 2020-05-08 SURGERY — ESOPHAGOGASTRODUODENOSCOPY (EGD) WITH PROPOFOL
Anesthesia: General

## 2020-05-08 MED ORDER — PROPOFOL 10 MG/ML IV BOLUS
INTRAVENOUS | Status: DC | PRN
  Administered 2020-05-08: 60 mg via INTRAVENOUS

## 2020-05-08 MED ORDER — SODIUM CHLORIDE 0.9 % IV SOLN: INTRAVENOUS | Status: DC

## 2020-05-08 MED ORDER — GLYCOPYRROLATE 0.2 MG/ML IJ SOLN
INTRAMUSCULAR | Status: AC
  Filled 2020-05-08: qty 1

## 2020-05-08 MED ORDER — LIDOCAINE HCL (PF) 2 % IJ SOLN
INTRAMUSCULAR | Status: AC
  Filled 2020-05-08: qty 5

## 2020-05-08 MED ORDER — PHENYLEPHRINE HCL (PRESSORS) 10 MG/ML IV SOLN
INTRAVENOUS | Status: AC
  Filled 2020-05-08: qty 1

## 2020-05-08 MED ORDER — LIDOCAINE HCL (CARDIAC) PF 100 MG/5ML IV SOSY
PREFILLED_SYRINGE | INTRAVENOUS | Status: DC | PRN
  Administered 2020-05-08: 50 mg via INTRAVENOUS

## 2020-05-08 MED ORDER — PROPOFOL 500 MG/50ML IV EMUL
INTRAVENOUS | Status: DC | PRN
  Administered 2020-05-08: 150 ug/kg/min via INTRAVENOUS

## 2020-05-08 MED ORDER — PROPOFOL 500 MG/50ML IV EMUL
INTRAVENOUS | Status: AC
  Filled 2020-05-08: qty 50

## 2020-05-08 NOTE — Anesthesia Postprocedure Evaluation (Signed)
Anesthesia Post Note  Patient: Rachel Torres  Procedure(s) Performed: ESOPHAGOGASTRODUODENOSCOPY (EGD) WITH PROPOFOL (N/A )  Patient location during evaluation: Endoscopy Anesthesia Type: General Level of consciousness: awake and alert Pain management: pain level controlled Vital Signs Assessment: post-procedure vital signs reviewed and stable Respiratory status: spontaneous breathing, nonlabored ventilation and respiratory function stable Cardiovascular status: blood pressure returned to baseline and stable Postop Assessment: no apparent nausea or vomiting Anesthetic complications: no     Last Vitals:  Vitals:   05/08/20 0841 05/08/20 0851  BP: 115/61 (!) 122/57  Pulse: 73 70  Resp: 16 13  Temp:    SpO2: 98% 97%    Last Pain:  Vitals:   05/08/20 0851  TempSrc:   PainSc: 0-No pain                 Alphonsus Sias

## 2020-05-08 NOTE — Anesthesia Preprocedure Evaluation (Addendum)
Anesthesia Evaluation  Patient identified by MRN, date of birth, ID band Patient awake    Reviewed: Allergy & Precautions, H&P , NPO status , reviewed documented beta blocker date and time   Airway Mallampati: II  TM Distance: >3 FB Neck ROM: limited    Dental  (+) Teeth Intact   Pulmonary asthma , sleep apnea , pneumonia, former smoker,    Pulmonary exam normal        Cardiovascular hypertension, + CAD and +CHF  Normal cardiovascular exam  07/2019 ECHO  1. The left ventricle has mildly reduced systolic function, with an  ejection fraction of 45-50%. The cavity size was mildly dilated. Left  ventricular diastolic Doppler parameters are consistent with  pseudonormalization. Left ventricular diffuse  hypokinesis.  2. The right ventricle has normal systolic function. The cavity was  normal. There is no increase in right ventricular wall thickness. Right  ventricular systolic pressure is mildly elevated with an estimated  pressure of 31.3 mmHg.  3. Left atrial size was mildly dilated.  4. A bioprosthesis valve is present in the aortic position. Procedure  Date: 08/2010 Normal aortic valve prosthesis. Mean gradient 5.5 mm Hg   Last visit w cardiologist 2 weeks ago, stable   Neuro/Psych  Headaches, PSYCHIATRIC DISORDERS Anxiety Depression    GI/Hepatic GERD  Medicated,  Endo/Other    Renal/GU Renal disease     Musculoskeletal   Abdominal   Peds  Hematology  (+) Blood dyscrasia, anemia ,   Anesthesia Other Findings Past Medical History: No date: AAA (abdominal aortic aneurysm) (HCC)     Comment:  a. Chronic w/o evidence of aneurysmal dil on CTA 01/2016. No date: Alcohol abuse No date: Allergy No date: Anemia No date: Anxiety No date: CAD (coronary artery disease)     Comment:  a. 08/2010 s/p CABG x 1 (VG->RCA) @ time of Ao dissection              repair; b. 01/2016 Lexiscan MV: mid antsept/apical defect                w/ ? peri-infarct ischemia-->likely attenuation-->Med Rx. No date: Chewing difficulty No date: Chronic combined systolic (congestive) and diastolic  (congestive) heart failure ([place])     Comment:  a. 2012 EF 30-35%; b. 04/2015 EF 40-45%; c. 01/2016 Echo:               Ef 55-65%, nl AoV bioprosthesis, nl RV, nl PASP. No date: Constipation No date: Depression No date: Edema of both lower extremities No date: Gallbladder sludge No date: GERD (gastroesophageal reflux disease) No date: Heart failure (HCC) No date: Heart valve problem No date: History of Bicuspid Aortic Valve     Comment:  a. 08/2010 s/p AVR @ time of Ao dissection repair; b.               01/2016 Echo: Ef 55-65%, nl AoV bioprosthesis, nl RV, nl               PASP. No date: Hx of repair of dissecting thoracic aortic aneurysm,  Stanford type A     Comment:  a. 08/2010 s/p repair with AVR and VG->RCA; b. 01/2016               CTA: stable appearance of Asc Thoracic Aortic graft.               Opacification of flase lumen of chronic abd Ao dissection  w/ retrograde flow through lumbar arteries. No aneurysmal              dil of flase lumen. No date: Hyperlipidemia No date: Hypertensive heart disease No date: IBS (irritable bowel syndrome) No date: Internal hemorrhoids No date: Kidney problem No date: Marfan syndrome     Comment:  pt denies No date: Migraine No date: Obstructive sleep apnea No date: Osteoarthritis No date: Osteoporosis No date: Palpitations No date: Pneumonia No date: RA (rheumatoid arthritis) (HCC)     Comment:  a. Followed by Dr. [name]  Past Surgical History: No date: ABDOMINAL AORTIC ANEURYSM REPAIR 2001: [place]: CESAREAN SECTION No date: CHOLECYSTECTOMY No date: COLONOSCOPY No date: FRACTURE SURGERY; Right     Comment:  shoulder replacement No date: HEMORRHOID BANDING No date: ORIF DISTAL RADIUS FRACTURE; Left No date: UPPER GASTROINTESTINAL  ENDOSCOPY     Reproductive/Obstetrics                            Anesthesia Physical Anesthesia Plan  ASA: III  Anesthesia Plan: General   Post-op Pain Management:    Induction: Intravenous  PONV Risk Score and Plan: Treatment may vary due to age or medical condition and TIVA  Airway Management Planned: Nasal Cannula and Natural Airway  Additional Equipment:   Intra-op Plan:   Post-operative Plan:   Informed Consent: I have reviewed the patients History and Physical, chart, labs and discussed the procedure including the risks, benefits and alternatives for the proposed anesthesia with the patient or authorized representative who has indicated his/her understanding and acceptance.     Dental Advisory Given  Plan Discussed with: CRNA  Anesthesia Plan Comments:         Anesthesia Quick Evaluation

## 2020-05-08 NOTE — Op Note (Signed)
[facility] Gastroenterology Patient Name: Rachel Torres Procedure Date: 05/08/2020 7:45 AM MRN: [id number] Account #: [id number] Date of Birth: 11-18-1956 Admit Type: Outpatient Age: 64 Room: [facility] ENDO ROOM 4 Gender: Female Note Status: Finalized Procedure:             Upper GI endoscopy Indications:           Follow-up of esophageal reflux Providers:             Nithya Meriweather B. Bonna Gains MD, MD Medicines:             Monitored Anesthesia Care Complications:         No immediate complications. Procedure:             Pre-Anesthesia Assessment:                        - Prior to the procedure, a History and Physical was                         performed, and patient medications, allergies and                         sensitivities were reviewed. The patient's tolerance                         of previous anesthesia was reviewed.                        - The risks and benefits of the procedure and the                         sedation options and risks were discussed with the                         patient. All questions were answered and informed                         consent was obtained.                        - Patient identification and proposed procedure were                         verified prior to the procedure by the physician, the                         nurse, the anesthesiologist, the anesthetist and the                         technician. The procedure was verified in the                         procedure room.                        - ASA Grade Assessment: II - A patient with mild                         systemic disease.  After obtaining informed consent, the endoscope was                         passed under direct vision. Throughout the procedure,                         the patient's blood pressure, pulse, and oxygen                         saturations were monitored continuously. The Endoscope                         was  introduced through the mouth, and advanced to the                         second part of duodenum. The upper GI endoscopy was                         accomplished with ease. The patient tolerated the                         procedure well. Findings:      A single area of ectopic gastric mucosa was found in the proximal       esophagus.      The Z-line was irregular. Z- line was irregular. However, salmon colored       mucosa was NOT greater than 1 cm above Z- line and thus biopsies are NOT       indicated at this time.      The exam of the esophagus was otherwise normal.      Patchy mildly erythematous mucosa without bleeding was found in the       gastric antrum. Biopsies were taken with a cold forceps for histology.       Biopsies were obtained in the gastric body, at the incisura and in the       gastric antrum with cold forceps for histology.      Multiple 4 to 5 mm sessile polyps with no bleeding and no stigmata of       recent bleeding were found in the gastric body. Biopsies were taken with       a cold forceps for histology.      The duodenal bulb, second portion of the duodenum and examined duodenum       were normal. Biopsies for histology were taken with a cold forceps for       evaluation of celiac disease. Biopsies were obtained in the duodenal       bulb and in the second portion of the duodenum with cold forceps for       evaluation of celiac disease. Impression:            - Ectopic gastric mucosa in the proximal esophagus.                        - Z-line irregular.                        - Erythematous mucosa in the antrum. Biopsied.                        - Multiple  gastric polyps. Biopsied.                        - Normal duodenal bulb, second portion of the duodenum                         and examined duodenum. Biopsied.                        - Biopsies were obtained in the gastric body, at the                         incisura and in the gastric antrum.                         - Biopsies were obtained in the duodenal bulb and in                         the second portion of the duodenum. Recommendation:        - Await pathology results.                        - Discharge patient to home (with escort).                        - Advance diet as tolerated.                        - Continue present medications.                        - Patient has a contact number available for                         emergencies. The signs and symptoms of potential                         delayed complications were discussed with the patient.                         Return to normal activities tomorrow. Written                         discharge instructions were provided to the patient.                        - Discharge patient to home (with escort).                        - The findings and recommendations were discussed with                         the patient.                        - The findings and recommendations were discussed with                         the patient's family. Procedure Code(s):     --- Professional ---  [id number], Esophagogastroduodenoscopy, flexible,                         transoral; with biopsy, single or multiple Diagnosis Code(s):     --- Professional ---                        Q40.2, Other specified congenital malformations of                         stomach                        K22.8, Other specified diseases of esophagus                        K31.89, Other diseases of stomach and duodenum                        K31.7, Polyp of stomach and duodenum                        K21.9, Gastro-esophageal reflux disease without                         esophagitis CPT copyright 2019 American Medical Association. All rights reserved. The codes documented in this report are preliminary and upon coder review may  be revised to meet current compliance requirements.  Vonda Antigua, MD Margretta Sidle B. Bonna Gains MD, MD 05/08/2020  8:33:23 AM This report has been signed electronically. Number of Addenda: 0 Note Initiated On: 05/08/2020 7:45 AM Estimated Blood Loss:  Estimated blood loss: none.      [facility]

## 2020-05-08 NOTE — Anesthesia Procedure Notes (Signed)
Date/Time: 05/08/2020 8:10 AM Performed by: Johnna Acosta, CRNA Pre-anesthesia Checklist: Patient identified, Emergency Drugs available, Suction available and Patient being monitored Patient Re-evaluated:Patient Re-evaluated prior to induction Oxygen Delivery Method: Nasal cannula Preoxygenation: Pre-oxygenation with 100% oxygen Induction Type: IV induction

## 2020-05-08 NOTE — Transfer of Care (Signed)
Immediate Anesthesia Transfer of Care Note  Patient: Rachel Torres  Procedure(s) Performed: ESOPHAGOGASTRODUODENOSCOPY (EGD) WITH PROPOFOL (N/A )  Patient Location: PACU  Anesthesia Type:General  Level of Consciousness: awake and alert   Airway & Oxygen Therapy: Patient Spontanous Breathing and Patient connected to nasal cannula oxygen  Post-op Assessment: Report given to RN and Post -op Vital signs reviewed and stable  Post vital signs: Reviewed and stable  Last Vitals:  Vitals Value Taken Time  BP 130/69 05/08/20 0831  Temp    Pulse 78 05/08/20 0831  Resp 23 05/08/20 0831  SpO2 96 % 05/08/20 0831    Last Pain:  Vitals:   05/08/20 0741  TempSrc: Temporal  PainSc: 2          Complications: No apparent anesthesia complications

## 2020-05-08 NOTE — H&P (Signed)
Rachel Antigua, MD [address] [place], [place], [place], [address] [address], [place], [place], [place], [address] Phone: [phone]  Fax: [phone]  Primary Care Physician:  Tower, Wynelle Fanny, MD   Pre-Procedure History & Physical: HPI:  Rachel Torres is a 64 y.o. female is here for an EGD.   Past Medical History:  Diagnosis Date  . AAA (abdominal aortic aneurysm) ([place])    a. Chronic w/o evidence of aneurysmal dil on CTA 01/2016.  Marland Kitchen Alcohol abuse   . Allergy   . Anemia   . Anxiety   . CAD (coronary artery disease)    a. 08/2010 s/p CABG x 1 (VG->RCA) @ time of Ao dissection repair; b. 01/2016 Lexiscan MV: mid antsept/apical defect w/ ? peri-infarct ischemia-->likely attenuation-->Med Rx.  . Chewing difficulty   . Chronic combined systolic (congestive) and diastolic (congestive) heart failure (Rachel Torres)    a. 2012 EF 30-35%; b. 04/2015 EF 40-45%; c. 01/2016 Echo: Ef 55-65%, nl AoV bioprosthesis, nl RV, nl PASP.  Marland Kitchen Constipation   . Depression   . Edema of both lower extremities   . Gallbladder sludge   . GERD (gastroesophageal reflux disease)   . Heart failure (Rachel Torres)   . Heart valve problem   . History of Bicuspid Aortic Valve    a. 08/2010 s/p AVR @ time of Ao dissection repair; b. 01/2016 Echo: Ef 55-65%, nl AoV bioprosthesis, nl RV, nl PASP.  Marland Kitchen Hx of repair of dissecting thoracic aortic aneurysm, Stanford type A    a. 08/2010 s/p repair with AVR and VG->RCA; b. 01/2016 CTA: stable appearance of Asc Thoracic Aortic graft. Opacification of flase lumen of chronic abd Ao dissection w/ retrograde flow through lumbar arteries. No aneurysmal dil of flase lumen.  . Hyperlipidemia   . Hypertensive heart disease   . IBS (irritable bowel syndrome)   . Internal hemorrhoids   . Kidney problem   . Marfan syndrome    pt denies  . Migraine   . Obstructive sleep apnea   . Osteoarthritis   . Osteoporosis   . Palpitations   . Pneumonia   . RA (rheumatoid arthritis) (Rachel Torres)    a.  Followed by Dr. [name]    Past Surgical History:  Procedure Laterality Date  . ABDOMINAL AORTIC ANEURYSM REPAIR    . CARDIAC CATHETERIZATION  2001  . [place]  . CHOLECYSTECTOMY    . COLONOSCOPY    . FRACTURE SURGERY Right    shoulder replacement  . HEMORRHOID BANDING    . ORIF DISTAL RADIUS FRACTURE Left   . UPPER GASTROINTESTINAL ENDOSCOPY      Prior to Admission medications   Medication Sig Start Date End Date Taking? Authorizing Provider  aspirin EC 81 MG tablet Take 81 mg by mouth at bedtime.   Yes [provider]  atorvastatin (LIPITOR) 20 MG tablet TAKE 1 TABLET EVERY DAY 03/20/20  Yes Gollan, Kathlene November, MD  carvedilol (COREG) 6.25 MG tablet TAKE 1 TABLET TWICE DAILY WITH MEALS 04/02/20  Yes Gollan, Kathlene November, MD  Cholecalciferol (VITAMIN D3) 5000 units CAPS Take 1 capsule by mouth daily.   Yes [provider]  furosemide (LASIX) 20 MG tablet TAKE 1 TABLET (20 MG TOTAL) BY MOUTH DAILY. NEED MD APPOINTMENT 10/03/19  Yes Minna Merritts, MD  Javier Docker Oil (OMEGA-3) 500 MG CAPS Take 1 capsule by mouth daily.   Yes [provider]  lisinopril (ZESTRIL) 5 MG tablet TAKE 1/2 TABLET EVERY  DAY 03/20/20  Yes Gollan, Kathlene November, MD  magnesium gluconate (MAGONATE) 500 MG tablet Take 500 mg by mouth at bedtime.   Yes [provider]  Misc Natural Products (TART CHERRY ADVANCED) CAPS Take 1 capsule by mouth daily.   Yes [provider]  omeprazole (PRILOSEC) 40 MG capsule TAKE 1 CAPSULE EVERY DAY Patient taking differently: Take 40 mg by mouth in the morning and at bedtime.  01/30/20  Yes Tower, Wynelle Fanny, MD  potassium chloride (K-DUR) 10 MEQ tablet Take 1 tablet (10 mEq total) by mouth 2 (two) times daily as needed. 09/10/19  Yes Minna Merritts, MD  venlafaxine XR (EFFEXOR-XR) 150 MG 24 hr capsule TAKE 1 CAPSULE EVERY DAY WITH BREAKFAST 01/30/20  Yes Tower, Wynelle Fanny, MD  albuterol (PROVENTIL HFA;VENTOLIN HFA) 108 (90 Base) MCG/ACT  inhaler Inhale 1-2 puffs into the lungs every 6 (six) hours as needed for wheezing or shortness of breath. 01/12/19   Pleas Koch, NP  ALPRAZolam Duanne Moron) 0.5 MG tablet Take 1 tablet (0.5 mg total) by mouth daily as needed for anxiety. 02/19/20   Tower, Wynelle Fanny, MD  cyclobenzaprine (FLEXERIL) 10 MG tablet Take 0.5-1 tablets (5-10 mg total) by mouth 3 (three) times daily as needed. For headache 06/20/19   Tower, Wynelle Fanny, MD  naproxen sodium (ANAPROX) 220 MG tablet Take 440 mg by mouth 2 (two) times daily as needed (for headaches/pain).    [provider]  nitroGLYCERIN (NITROSTAT) 0.4 MG SL tablet Place 1 tablet (0.4 mg total) under the tongue every 5 (five) minutes as needed for chest pain. 04/26/17   Minna Merritts, MD  promethazine (PHENERGAN) 25 MG tablet Take 0.5 tablets (12.5 mg total) by mouth every 8 (eight) hours as needed for nausea. 06/20/19   Tower, Wynelle Fanny, MD  Simethicone (MYLANTA GAS PO) Take by mouth 3 times/day as needed-between meals & bedtime.    [provider]    Allergies as of 04/15/2020 - Review Complete 04/15/2020  Allergen Reaction Noted  . Fosamax [alendronate sodium]  04/29/2017  . Ginzing [ginseng] Other (See Comments) 10/06/2016  . Hydrocod polst-cpm polst er Itching 09/20/2011  . Hydrocodone-acetaminophen Itching   . Imitrex [sumatriptan]  04/29/2017  . Oxycodone Itching 04/18/2015  . Peanut-containing drug products Other (See Comments)   . Tramadol Other (See Comments) 09/07/2017  . Hydrocodone-guaifenesin Itching 06/07/2013  . Prednisone Rash 07/26/2007  . Tape Itching 09/20/2011    Family History  Problem Relation Age of Onset  . Hypertension Mother   . Depression Mother   . Osteoporosis Mother   . Stroke Mother   . Irritable bowel syndrome Mother   . Asthma Mother   . Heart disease Mother   . Thyroid disease Mother   . Anxiety disorder Mother   . Arthritis Sister   . Diabetes Sister   . Heart disease Sister   . Asthma Sister     . Migraines Sister   . Nephrolithiasis Sister   . Osteoporosis Sister   . Arthritis Sister   . Asthma Sister   . Migraines Sister   . Other Father 40  . Stroke Maternal Grandmother   . Colon cancer Maternal Grandmother   . Lymphoma Maternal Grandmother   . Migraines Maternal Grandmother   . Aneurysm Paternal Grandmother        brain  . Diabetes Paternal Grandmother   . Arthritis Paternal Grandmother   . Arthritis Paternal Grandfather   . Diabetes Paternal Grandfather   . Other  Brother        prediabeties  . Asthma Brother   . Migraines Brother   . Lung cancer Maternal Grandfather   . Melanoma Maternal Grandfather     Social History   Socioeconomic History  . Marital status: Married    Spouse name: [name]  . Number of children: 2  . Years of education: Not on file  . Highest education level: Not on file  Occupational History  . 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 Smoking status: Former Smoker    Packs/day: 0.75    Years: 1.50    Pack years: 1.12    Types: Cigarettes    Quit date: 12/20/1978    Years since quitting: 41.4  . Smokeless tobacco: Never Used  Substance and Sexual Activity  . Alcohol use: Yes    Alcohol/week: 0.0 standard drinks  . Drug use: No  . Sexual activity: Never  Other Topics Concern  . Not on file  Social History Narrative    married for the second time, happily   On disability after dissection of aortic aneurysm   Husband has Parkinson's - deteriorating w/ dementia   4-6 caffeinated beverages daily   01/25/2018         Social Determinants of Health   Financial Resource Strain: Low Risk   . Difficulty of Paying Living Expenses: Not hard at all  Food Insecurity:   . Worried About Charity fundraiser in the Last Year:   . Arboriculturist in the Last Year:   Transportation Needs:   . Film/video editor (Medical):   Marland Kitchen Lack of Transportation (Non-Medical):   Physical Activity:   . Days of Exercise per  Week:   . Minutes of Exercise per Session:   Stress:   . Feeling of Stress :   Social Connections:   . Frequency of Communication with Friends and Family:   . Frequency of Social Gatherings with Friends and Family:   . Attends Religious Services:   . Active Member of Clubs or Organizations:   . Attends Archivist Meetings:   Marland Kitchen Marital Status:   Intimate Partner Violence:   . Fear of Current or Ex-Partner:   . Emotionally Abused:   Marland Kitchen Physically Abused:   . Sexually Abused:     Review of Systems: See HPI, otherwise negative ROS  Physical Exam: BP 129/75   Temp (!) 97 F (36.1 C) (Temporal)   Resp 18   Ht 5\' 5"  (1.651 m)   Wt 96.2 kg   SpO2 97%   BMI 35.28 kg/m  General:   Alert,  pleasant and cooperative in NAD Head:  Normocephalic and atraumatic. Neck:  Supple; no masses or thyromegaly. Lungs:  Clear throughout to auscultation, normal respiratory effort.    Heart:  +S1, +S2, Regular rate and rhythm, No edema. Abdomen:  Soft, nontender and nondistended. Normal bowel sounds, without guarding, and without rebound.   Neurologic:  Alert and  oriented x4;  grossly normal neurologically.  Impression/Plan: Cyan C Baltz is here for an EGD for Acid Reflux.  Risks, benefits, limitations, and alternatives regarding the procedure have been reviewed with the patient.  Questions have been answered.  All parties agreeable.   Virgel Manifold, MD  05/08/2020, 8:05 AM

## 2020-05-09 ENCOUNTER — Encounter: Payer: Self-pay | Admitting: *Deleted

## 2020-05-09 LAB — SURGICAL PATHOLOGY

## 2020-05-13 ENCOUNTER — Encounter: Payer: Self-pay | Admitting: Gastroenterology

## 2020-05-14 ENCOUNTER — Other Ambulatory Visit: Payer: Self-pay

## 2020-05-14 ENCOUNTER — Ambulatory Visit: Payer: Medicare HMO | Attending: Family Medicine

## 2020-05-14 DIAGNOSIS — M542 Cervicalgia: Secondary | ICD-10-CM | POA: Diagnosis not present

## 2020-05-14 DIAGNOSIS — R519 Headache, unspecified: Secondary | ICD-10-CM | POA: Diagnosis not present

## 2020-05-14 DIAGNOSIS — R293 Abnormal posture: Secondary | ICD-10-CM | POA: Insufficient documentation

## 2020-05-14 NOTE — Therapy (Signed)
[place] [facility] SERVICES [address] [place], [place], [address] Phone: [phone]   Fax:  [phone]  Physical Therapy Evaluation  Patient Details  Name: Rachel Torres MRN: [id number] Date of Birth: Dec 16, 1956 Referring Provider (PT): Glori Bickers, Wynelle Fanny MD   Encounter Date: 05/14/2020  PT End of Session - 05/14/20 1614    Visit Number  1    Number of Visits  16    Date for PT Re-Evaluation  07/09/20    Authorization Type  1/10 eval 05/14/20    PT Start Time  1430    PT Stop Time  1515    PT Time Calculation (min)  45 min    Activity Tolerance  Patient tolerated treatment well;Patient limited by pain    Behavior During Therapy  [facility] for tasks assessed/performed       Past Medical History:  Diagnosis Date  . AAA (abdominal aortic aneurysm) ([place])    a. Chronic w/o evidence of aneurysmal dil on CTA 01/2016.  Marland Kitchen Alcohol abuse   . Allergy   . Anemia   . Anxiety   . CAD (coronary artery disease)    a. 08/2010 s/p CABG x 1 (VG->RCA) @ time of Ao dissection repair; b. 01/2016 Lexiscan MV: mid antsept/apical defect w/ ? peri-infarct ischemia-->likely attenuation-->Med Rx.  . Chewing difficulty   . Chronic combined systolic (congestive) and diastolic (congestive) heart failure ([place])    a. 2012 EF 30-35%; b. 04/2015 EF 40-45%; c. 01/2016 Echo: Ef 55-65%, nl AoV bioprosthesis, nl RV, nl PASP.  Marland Kitchen Constipation   . Depression   . Edema of both lower extremities   . Gallbladder sludge   . GERD (gastroesophageal reflux disease)   . Heart failure ([place])   . Heart valve problem   . History of Bicuspid Aortic Valve    a. 08/2010 s/p AVR @ time of Ao dissection repair; b. 01/2016 Echo: Ef 55-65%, nl AoV bioprosthesis, nl RV, nl PASP.  Marland Kitchen Hx of repair of dissecting thoracic aortic aneurysm, [place] type A    a. 08/2010 s/p repair with AVR and VG->RCA; b. 01/2016 CTA: stable appearance of Asc Thoracic Aortic graft. Opacification of flase lumen of chronic  abd Ao dissection w/ retrograde flow through lumbar arteries. No aneurysmal dil of flase lumen.  . Hyperlipidemia   . Hypertensive heart disease   . IBS (irritable bowel syndrome)   . Internal hemorrhoids   . Kidney problem   . Marfan syndrome    pt denies  . Migraine   . Obstructive sleep apnea   . Osteoarthritis   . Osteoporosis   . Palpitations   . Pneumonia   . RA (rheumatoid arthritis) ([place])    a. Followed by Dr. [name]    Past Surgical History:  Procedure Laterality Date  . ABDOMINAL AORTIC ANEURYSM REPAIR    . CARDIAC CATHETERIZATION  2001  . [place]  . CHOLECYSTECTOMY    . COLONOSCOPY    . ESOPHAGOGASTRODUODENOSCOPY (EGD) WITH PROPOFOL N/A 05/08/2020   Procedure: ESOPHAGOGASTRODUODENOSCOPY (EGD) WITH PROPOFOL;  Surgeon: Virgel Manifold, MD;  Location: [facility] ENDOSCOPY;  Service: Endoscopy;  Laterality: N/A;  . FRACTURE SURGERY Right    shoulder replacement  . HEMORRHOID BANDING    . ORIF DISTAL RADIUS FRACTURE Left   . UPPER GASTROINTESTINAL ENDOSCOPY      There were no vitals filed for this visit.   Subjective Assessment - 05/14/20 1438    Subjective  Patient  is a pleasant 64 year old female who presents for atypical migraines.    Pertinent History  Patient has been seen by this therapist in the past for back related injuries/pain. Was last seen by this author in December of 2019. Since discharge patient has had COVID and an upper GI due to stomach issues. PMH includes aorta type A dissection (2011) with aortic valve replacement, bypass of R Coronary artery, hypertension, hyperlipidemia, CHF, pre-diabetes, AFIB, anxiety, depression, arthritis, GERD, atypical migraine, asthma, allergic rhinitis. 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 Does now have help with aide.    Limitations  Sitting;Reading;Lifting;Standing;Walking;Writing;Other (comment);House hold activities    How long can you sit comfortably?  20 mins before whole body  discomfort    How long can you stand comfortably?  5 mins  or less: lower back    How long can you walk comfortably?  2 minutes    Patient Stated Goals  to decrease pain and migraines    Currently in Pain?  Yes    Pain Score  5     Pain Location  Head    Pain Orientation  Lateral;Anterior;Posterior    Pain Descriptors / Indicators  Headache;Pounding;Stabbing    Pain Type  Chronic pain    Pain Radiating Towards  eye    Pain Onset  More than a month ago    Pain Frequency  Intermittent    Aggravating Factors   tension, stress, sometimes unable to tell when they will happen    Pain Relieving Factors  pain medication,    Effect of Pain on Daily Activities  unable to perform daily activities for length of migraine 1--2 days         [facility] PT Assessment - 05/14/20 1454      Assessment   Medical Diagnosis  atypical migraine    Referring Provider (PT)  Tower, Wynelle Fanny MD    Onset Date/Surgical Date  --   2020   Hand Dominance  Right    Prior Therapy  yes       Precautions   Precautions  Other (comment)   heart surgery, bypass,      Restrictions   Weight Bearing Restrictions  No      Balance Screen   Has the patient fallen in the past 6 months  No    Has the patient had a decrease in activity level because of a fear of falling?   Yes    Is the patient reluctant to leave their home because of a fear of falling?   No      Home Environment   Living Environment  Private residence    Living Arrangements  Spouse/significant other;Parent    Type of [place] to enter    Entrance Stairs-Number of Steps  [place]  One level    Home Equipment  --   husband and mom have multiple pieces of AD      Prior Function   Level of Independence  Independent      Cognition   Overall Cognitive Status  Within Functional Limits for tasks assessed          Evaluation for migraines: requests dry needling Patient has been  seen by this therapist in the past for back related injuries/pain. Was last seen by this author in December of 2019. Since discharge patient has had COVID and  an upper GI due to stomach issues. PMH includes aorta type A dissection (2011) with aortic valve replacement, bypass of R Coronary artery, hypertension, hyperlipidemia, CHF, pre-diabetes, AFIB, anxiety, depression, arthritis, GERD, atypical migraine, asthma, allergic rhinitis. 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 Does now have help with aide.   PAIN: R heel spur: had a cortisone shot in it 5-6 years ago and it helped R hip pain > L hip pain:  Bilateral neck pain from shoulders to occiput:   Migraine :  Frontal to temporal as well as posterior occipital region pain on either side.  Does get aura with migraines: 90% of the time; 1-2x/week;  Lasts 1-2 days.   Head pain:  Current pain head: 5/10 Worst pain: 9/10 Best pain: 0/10   Back pain: lumbar and cervical  Worst: 8/10 Best; 1/10   POSTURE: Seated: forward head rounded shoulders   PROM/AROM:  Right Left  Flexion 41*  Extension 70  Side Bending 40* 34*  Rotation 46* 42*   Accessory Motions: Cervical; C1-2 most reproducing; hypomobile throughout region  Thoracic: UPA and CPA: very hypomobile with high muscle guarding  Muscle tissue length limitation: resulting in FHRS positioning -bilateral upper trap -bilateral levator scap -SCM bilaterally    STRENGTH:  Graded on a 0-5 scale Muscle Group Left Right  Shoulder flex 4- 3+  Shoulder Abd 3+ 3+  Shoulder Ext 4- 4-  Shoulder IR/ER 3+ 3+  Elbow 4- 4-    SPECIAL TESTS: Distraction : relieves pain Upper trap tension test : +  ULTT: + radial, median slightly biased by pain/muscle tissue length.  CAD deferred due to known diagnosis  FUNCTIONAL MOBILITY: Able to transition into positions but does need seated rest breaks due to dizziness with transitions.   BALANCE: Requires rest breaks due to instability.    GAIT: Patient ambulate with narrow BOS, limited trunk and limb rotation  OUTCOME MEASURES: TEST Outcome Interpretation  FOTO 44/100   Risk adjusted FOTO 33/100 Discharge score predicted 45/100  DHI 40 Moderate handicap                Treat STM cervical and upper paraspinal; tighter on R cervical and upper thoracic regionin seated position x 4 minutes   education on posture and positional awareness; seated posture education with tactile cueing for positioning      Objective measurements completed on examination: See above findings.              PT Education - 05/14/20 1614    Education Details  goals, POC    Person(s) Educated  Patient    Methods  Explanation    Comprehension  Verbalized understanding       PT Short Term Goals - 05/14/20 1617      PT SHORT TERM GOAL #1   Title  Patient will be independent in home exercise program to improve strength/mobility and decrease pain for better functional independence with ADLs.    Baseline  5/26: HEP next session    Time  2    Period  Weeks    Status  New    Target Date  05/28/20      PT SHORT TERM GOAL #2   Title  Patient will report a worst pain of 7/10 on VAS in head to improve tolerance with ADLs and reduced symptoms with activities.    Baseline  5/26: 9/10    Time  2    Period  Weeks    Status  New  Target Date  05/28/20        PT Long Term Goals - 05/14/20 1623      PT LONG TERM GOAL #1   Title  Patient will report a worst pain of 3/10 on VAS in her head/migraines to improve tolerance with ADLs and reduced symptoms with activities    Baseline  5/26: 9/10    Time  8    Period  Weeks    Status  New    Target Date  07/09/20      PT LONG TERM GOAL #2   Title  Patient will increase FOTO score to equal to or greater than   45/100  to demonstrate statistically significant improvement in mobility and quality of life.    Baseline  5/26: 44 with risk adjusted 33/100    Time  8    Period  Weeks     Status  New    Target Date  07/09/20      PT LONG TERM GOAL #3   Title  Patient will reduce dizziness handicap inventory score to <30, for less dizziness with ADLs and increased safety with home and work tasks.    Baseline  5/26: 40    Time  8    Period  Weeks    Status  New    Target Date  07/09/20      PT LONG TERM GOAL #4   Title  Patient will improve cervical rotation ROM to >/=60 degrees bilaterally to improve functional mobility and demonstrate improved muscle tissue length    Baseline  5/26: R 46 L 42    Time  8    Period  Weeks    Status  New    Target Date  07/09/20             Plan - 05/14/20 1615    Clinical Impression Statement  Patient presents to physical therapy for atypical migraine. She demonstrates limited muscle tissue length of cervical musculature as well as thoracic paraspinal and scapular musculature resulting in forward head rounded shoulders positioning with high muscle guarding. Distraction did result in decreased migraine in one region however through evaluation in combination with patient interview it appears that patient has two separate headache/migraine symptoms: one affecting anterior and lateral aspects and one affecting posterior region. The posterior region presents with tension typical symptoms with concurrent muscle tissue length limitations.  Patient will benefit from skilled physical therapy to decrease pain, improve posture, and improve quality of life.    Personal Factors and Comorbidities  Age;Comorbidity 3+;Fitness;Finances;Past/Current Experience;Social Background;Time since onset of injury/illness/exacerbation;Other    Comorbidities  aorta type A dissection (2011) with aortic valve replacement, bypass of R Coronary artery, hypertension, hyperlipidemia, CHF, pre-diabetes, AFIB, anxiety, depression, arthritis, GERD, atypical migraine, asthma, allergic rhinitis.    Examination-Activity Limitations  Bend;Caring for  Others;Carry;Dressing;Stairs;Squat;Sit;Reach Overhead;Locomotion Level;Lift;Stand;Toileting;Transfers    Examination-Participation Restrictions  Church;Cleaning;Community Activity;Interpersonal Relationship;Driving;Laundry;Volunteer;Shop;Personal Finances;Meal Prep;Medication Management;Yard Work    [employer]  Evolving/Moderate complexity    Clinical Decision Making  Moderate    Rehab Potential  Fair    PT Frequency  2x / week    PT Duration  8 weeks    PT Treatment/Interventions  ADLs/Self Care Home Management;Aquatic Therapy;Vestibular;Vasopneumatic Device;Taping;Passive range of motion;Dry needling;Energy conservation;Manual techniques;Therapeutic exercise;Balance training;Neuromuscular re-education;Patient/family education;Therapeutic activities;Functional mobility training;Stair training;Gait training;Electrical Stimulation;Iontophoresis 4mg /ml Dexamethasone;Moist Heat;Traction;Ultrasound;Cryotherapy;Canalith Repostioning;Joint Manipulations;Spinal Manipulations    PT Next Visit Plan  cervical muscle tissue lengthening, give HEP,    PT Home Exercise Plan  give next session    Consulted and Agree with Plan of Care  Patient       Patient will benefit from skilled therapeutic intervention in order to improve the following deficits and impairments:  Cardiopulmonary status limiting activity, Decreased activity tolerance, Decreased endurance, Decreased mobility, Decreased range of motion, Decreased strength, Difficulty walking, Dizziness, Hypomobility, Impaired flexibility, Impaired perceived functional ability, Increased muscle spasms, Impaired UE functional use, Impaired vision/preception, Obesity, Postural dysfunction, Improper body mechanics, Pain  Visit Diagnosis: Nonintractable headache, unspecified chronicity pattern, unspecified headache type  Abnormal posture     Problem List Patient Active Problem List   Diagnosis Date Noted  . Diarrhea 04/11/2020  .  Epigastric pain 02/19/2020  . Dark stools 02/19/2020  . PAF (paroxysmal atrial fibrillation) ([place]) 02/05/2019  . HPV (human papilloma virus) infection 12/01/2016  . Screening mammogram, encounter for 11/29/2016  . Estrogen deficiency 11/29/2016  . Encounter for routine gynecological examination 11/29/2016  . Grade III hemorrhoids 10/06/2016  . Tachycardia 08/14/2016  . Hemorrhoids 08/13/2016  . Atypical migraine 08/03/2016  . CAD (coronary artery disease)   . AAA (abdominal aortic aneurysm) ([place])   . Chronic combined systolic (congestive) and diastolic (congestive) heart failure ([place])   . Prediabetes 02/11/2016  . Generalized anxiety disorder 12/08/2015  . Panic attacks 07/28/2015  . Sensorineural hearing loss 03/27/2013  . Vitamin D deficiency 07/03/2012  . H/O aortic dissection 05/12/2012  . Class 2 obesity due to excess calories with body mass index (BMI) of 35.0 to 35.9 in adult 11/21/2011  . Obesity (BMI 30.0-34.9) 11/21/2011  . Routine general medical examination at a health care facility 09/20/2011  . S/P AVR (aortic valve replacement) 01/07/2011  . CORONARY ARTERY BYPASS GRAFT, HX OF 01/07/2011  . Arthritis 12/20/2010  . H/O dissecting abdominal aortic aneurysm repair 08/24/2010  . IRRITABLE BOWEL SYNDROME 09/09/2009  . Obstructive sleep apnea 08/04/2009  . External hemorrhoid 06/26/2009  . Osteoporosis 01/09/2009  . Hyperlipidemia 07/26/2007  . Depression with anxiety 07/26/2007  . Essential hypertension 07/26/2007  . Allergic rhinitis 07/26/2007  . Asthma 07/26/2007  . GERD 07/26/2007  . Osteoarthritis 07/26/2007  . Ocular migraine 07/26/2007  . Mild intermittent asthma without complication [id number]   Janna Arch, PT, DPT   05/14/2020, 4:34 PM  [place] [facility] SERVICES [address] [place], [place], [address] Phone: [phone]   Fax:  [phone]  Name: KAYLONNI MANK MRN: [id number] Date of Birth:  [birth year]

## 2020-05-15 ENCOUNTER — Encounter: Payer: Self-pay | Admitting: Family Medicine

## 2020-05-15 DIAGNOSIS — M549 Dorsalgia, unspecified: Secondary | ICD-10-CM

## 2020-05-15 DIAGNOSIS — G8929 Other chronic pain: Secondary | ICD-10-CM

## 2020-05-19 ENCOUNTER — Encounter: Payer: Self-pay | Admitting: Family Medicine

## 2020-05-20 ENCOUNTER — Ambulatory Visit: Payer: Medicare HMO | Attending: Family Medicine

## 2020-05-20 ENCOUNTER — Encounter (INDEPENDENT_AMBULATORY_CARE_PROVIDER_SITE_OTHER): Payer: Self-pay | Admitting: Bariatrics

## 2020-05-20 ENCOUNTER — Other Ambulatory Visit: Payer: Self-pay

## 2020-05-20 ENCOUNTER — Ambulatory Visit (INDEPENDENT_AMBULATORY_CARE_PROVIDER_SITE_OTHER): Payer: Medicare HMO | Admitting: Bariatrics

## 2020-05-20 VITALS — BP 125/97 | HR 77 | Temp 97.7°F | Ht 65.0 in | Wt 205.0 lb

## 2020-05-20 DIAGNOSIS — B379 Candidiasis, unspecified: Secondary | ICD-10-CM | POA: Diagnosis not present

## 2020-05-20 DIAGNOSIS — Z9189 Other specified personal risk factors, not elsewhere classified: Secondary | ICD-10-CM | POA: Diagnosis not present

## 2020-05-20 DIAGNOSIS — R293 Abnormal posture: Secondary | ICD-10-CM | POA: Insufficient documentation

## 2020-05-20 DIAGNOSIS — Z6834 Body mass index (BMI) 34.0-34.9, adult: Secondary | ICD-10-CM

## 2020-05-20 DIAGNOSIS — M546 Pain in thoracic spine: Secondary | ICD-10-CM | POA: Diagnosis not present

## 2020-05-20 DIAGNOSIS — M545 Low back pain: Secondary | ICD-10-CM | POA: Insufficient documentation

## 2020-05-20 DIAGNOSIS — M542 Cervicalgia: Secondary | ICD-10-CM | POA: Insufficient documentation

## 2020-05-20 DIAGNOSIS — E669 Obesity, unspecified: Secondary | ICD-10-CM | POA: Diagnosis not present

## 2020-05-20 DIAGNOSIS — M8580 Other specified disorders of bone density and structure, unspecified site: Secondary | ICD-10-CM | POA: Diagnosis not present

## 2020-05-20 DIAGNOSIS — G8929 Other chronic pain: Secondary | ICD-10-CM | POA: Diagnosis not present

## 2020-05-20 DIAGNOSIS — E7849 Other hyperlipidemia: Secondary | ICD-10-CM | POA: Diagnosis not present

## 2020-05-20 DIAGNOSIS — R7303 Prediabetes: Secondary | ICD-10-CM | POA: Diagnosis not present

## 2020-05-20 MED ORDER — FLUCONAZOLE 150 MG PO TABS: ORAL_TABLET | ORAL | 0 refills | Status: DC

## 2020-05-20 NOTE — Therapy (Signed)
[place] MAIN [facility] SERVICES [address] [place], [place], [address] Phone: [phone]   Fax:  [phone]  Physical Therapy Treatment  Patient Details  Name: Rachel Torres MRN: [id number] Date of Birth: [birth year] Referring Provider (PT): Glori Bickers, Wynelle Fanny MD   Encounter Date: 05/20/2020  PT End of Session - 05/20/20 2126    Visit Number  2    Number of Visits  16    Date for PT Re-Evaluation  07/09/20    Authorization Type  2/10 eval 05/14/20    PT Start Time  1345    PT Stop Time  1429    PT Time Calculation (min)  44 min    Activity Tolerance  Patient tolerated treatment well;Patient limited by pain    Behavior During Therapy  [facility] for tasks assessed/performed       Past Medical History:  Diagnosis Date  . AAA (abdominal aortic aneurysm) ([place])    a. Chronic w/o evidence of aneurysmal dil on CTA 01/2016.  Marland Kitchen Alcohol abuse   . Allergy   . Anemia   . Anxiety   . CAD (coronary artery disease)    a. 08/2010 s/p CABG x 1 (VG->RCA) @ time of Ao dissection repair; b. 01/2016 Lexiscan MV: mid antsept/apical defect w/ ? peri-infarct ischemia-->likely attenuation-->Med Rx.  . Chewing difficulty   . Chronic combined systolic (congestive) and diastolic (congestive) heart failure ([place])    a. 2012 EF 30-35%; b. 04/2015 EF 40-45%; c. 01/2016 Echo: Ef 55-65%, nl AoV bioprosthesis, nl RV, nl PASP.  Marland Kitchen Constipation   . Depression   . Edema of both lower extremities   . Gallbladder sludge   . GERD (gastroesophageal reflux disease)   . Heart failure ([place])   . Heart valve problem   . History of Bicuspid Aortic Valve    a. 08/2010 s/p AVR @ time of Ao dissection repair; b. 01/2016 Echo: Ef 55-65%, nl AoV bioprosthesis, nl RV, nl PASP.  Marland Kitchen Hx of repair of dissecting thoracic aortic aneurysm, [place] type A    a. 08/2010 s/p repair with AVR and VG->RCA; b. 01/2016 CTA: stable appearance of Asc Thoracic Aortic graft. Opacification of flase lumen of chronic  abd Ao dissection w/ retrograde flow through lumbar arteries. No aneurysmal dil of flase lumen.  . Hyperlipidemia   . Hypertensive heart disease   . IBS (irritable bowel syndrome)   . Internal hemorrhoids   . Kidney problem   . Marfan syndrome    pt denies  . Migraine   . Obstructive sleep apnea   . Osteoarthritis   . Osteoporosis   . Palpitations   . Pneumonia   . RA (rheumatoid arthritis) ([place])    a. Followed by Dr. [name]    Past Surgical History:  Procedure Laterality Date  . ABDOMINAL AORTIC ANEURYSM REPAIR    . CARDIAC CATHETERIZATION  2001  . [place]  . CHOLECYSTECTOMY    . COLONOSCOPY    . ESOPHAGOGASTRODUODENOSCOPY (EGD) WITH PROPOFOL N/A 05/08/2020   Procedure: ESOPHAGOGASTRODUODENOSCOPY (EGD) WITH PROPOFOL;  Surgeon: Virgel Manifold, MD;  Location: [facility] ENDOSCOPY;  Service: Endoscopy;  Laterality: N/A;  . FRACTURE SURGERY Right    shoulder replacement  . HEMORRHOID BANDING    . ORIF DISTAL RADIUS FRACTURE Left   . UPPER GASTROINTESTINAL ENDOSCOPY      There were no vitals filed for this visit.  Subjective Assessment - 05/20/20 2124    Subjective  Patient reports  having radiating headache/pain behind bilateral eyes. Has been performing caregiver duties.    Pertinent History  Patient has been seen by this therapist in the past for back related injuries/pain. Was last seen by this author in December of 2019. Since discharge patient has had COVID and an upper GI due to stomach issues. PMH includes aorta type A dissection (2011) with aortic valve replacement, bypass of R Coronary artery, hypertension, hyperlipidemia, CHF, pre-diabetes, AFIB, anxiety, depression, arthritis, GERD, atypical migraine, asthma, allergic rhinitis. 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 Does now have help with aide.    Limitations  Sitting;Reading;Lifting;Standing;Walking;Writing;Other (comment);House hold activities    How long can you sit comfortably?  20 mins  before whole body discomfort    How long can you stand comfortably?  5 mins  or less: lower back    How long can you walk comfortably?  2 minutes    Patient Stated Goals  to decrease pain and migraines    Currently in Pain?  Yes    Pain Score  6     Pain Location  Head    Pain Orientation  Left;Right    Pain Descriptors / Indicators  Aching;Jabbing;Headache    Pain Type  Chronic pain    Pain Onset  More than a month ago    Pain Frequency  Intermittent                 Prone:    Thoracic mobilizations T1-T11 CPAs and UPAs    Grade I-III CPA and UPAs c2-T1 hypomobile throughout with pain relief upon L C4 ; 4x10 seconds each level Thoracic J sweep against thoracic curvature mobilizations grade I-III 5x10 seconds      Supine: Sidebending with overpressure stretch 4x 20 seconds with R more tight than L Rotation l and R with overpressure stretch 4x20 seconds with R more tight than L.   ULT median nerve glide supine10xRUE; LUE ULT radial nerve glide supine 10x RUE; LUE   Suboccipital release 4x 20 seconds    Trigger point/soft tissue release with movement to improve soft tissue limited muscle tissue length. 5 minutes   Seated scapular retractions 20x   Head on plinth table: seated with head on arms. STM with focus on levator scap, upper trap, cervical paraspinals, multiple trigger points requiring release x 8 minutes        Pt. response to medical necessity:  Patient will continue to benefit from skilled physical therapy to reduce pain, improve cervical mobility, and allow patient better QOL              PT Education - 05/20/20 2125    Education provided  Yes    Education Details  exercise technique, body mechanics, manual    Person(s) Educated  Patient    Methods  Explanation;Demonstration;Tactile cues;Verbal cues    Comprehension  Verbalized understanding;Returned demonstration;Verbal cues required;Tactile cues required       PT Short Term Goals -  05/14/20 1617      PT SHORT TERM GOAL #1   Title  Patient will be independent in home exercise program to improve strength/mobility and decrease pain for better functional independence with ADLs.    Baseline  5/26: HEP next session    Time  2    Period  Weeks    Status  New    Target Date  05/28/20      PT SHORT TERM GOAL #2   Title  Patient will report a worst pain of  7/10 on VAS in head to improve tolerance with ADLs and reduced symptoms with activities.    Baseline  5/26: 9/10    Time  2    Period  Weeks    Status  New    Target Date  05/28/20        PT Long Term Goals - 05/14/20 1623      PT LONG TERM GOAL #1   Title  Patient will report a worst pain of 3/10 on VAS in her head/migraines to improve tolerance with ADLs and reduced symptoms with activities    Baseline  5/26: 9/10    Time  8    Period  Weeks    Status  New    Target Date  07/09/20      PT LONG TERM GOAL #2   Title  Patient will increase FOTO score to equal to or greater than   45/100  to demonstrate statistically significant improvement in mobility and quality of life.    Baseline  5/26: 44 with risk adjusted 33/100    Time  8    Period  Weeks    Status  New    Target Date  07/09/20      PT LONG TERM GOAL #3   Title  Patient will reduce dizziness handicap inventory score to <30, for less dizziness with ADLs and increased safety with home and work tasks.    Baseline  5/26: 40    Time  8    Period  Weeks    Status  New    Target Date  07/09/20      PT LONG TERM GOAL #4   Title  Patient will improve cervical rotation ROM to >/=60 degrees bilaterally to improve functional mobility and demonstrate improved muscle tissue length    Baseline  5/26: R 46 L 42    Time  8    Period  Weeks    Status  New    Target Date  07/09/20            Plan - 05/20/20 2129    Clinical Impression Statement  Patient presents with headache radiating to bilateral eyes. By end of session patient reports reduction of  headache by 2 points. Patient educated on proper body mechanics and posture for reduction of tension between sessions. Patient will benefit from skilled physical therapy to decrease pain, improve posture, and improve quality of life.    Personal Factors and Comorbidities  Age;Comorbidity 3+;Fitness;Finances;Past/Current Experience;Social Background;Time since onset of injury/illness/exacerbation;Other    Comorbidities  aorta type A dissection (2011) with aortic valve replacement, bypass of R Coronary artery, hypertension, hyperlipidemia, CHF, pre-diabetes, AFIB, anxiety, depression, arthritis, GERD, atypical migraine, asthma, allergic rhinitis.    Examination-Activity Limitations  Bend;Caring for Others;Carry;Dressing;Stairs;Squat;Sit;Reach Overhead;Locomotion Level;Lift;Stand;Toileting;Transfers    Examination-Participation Restrictions  Church;Cleaning;Community Activity;Interpersonal Relationship;Driving;Laundry;Volunteer;Shop;Personal Finances;Meal Prep;Medication Management;Yard Work    [employer]  Evolving/Moderate complexity    Rehab Potential  Fair    PT Frequency  2x / week    PT Duration  8 weeks    PT Treatment/Interventions  ADLs/Self Care Home Management;Aquatic Therapy;Vestibular;Vasopneumatic Device;Taping;Passive range of motion;Dry needling;Energy conservation;Manual techniques;Therapeutic exercise;Balance training;Neuromuscular re-education;Patient/family education;Therapeutic activities;Functional mobility training;Stair training;Gait training;Electrical Stimulation;Iontophoresis 4mg /ml Dexamethasone;Moist Heat;Traction;Ultrasound;Cryotherapy;Canalith Repostioning;Joint Manipulations;Spinal Manipulations    PT Next Visit Plan  cervical muscle tissue lengthening, give HEP,    PT Home Exercise Plan  give next session    Consulted and Agree with Plan of Care  Patient  Patient will benefit from skilled therapeutic intervention in order to improve the  following deficits and impairments:  Cardiopulmonary status limiting activity, Decreased activity tolerance, Decreased endurance, Decreased mobility, Decreased range of motion, Decreased strength, Difficulty walking, Dizziness, Hypomobility, Impaired flexibility, Impaired perceived functional ability, Increased muscle spasms, Impaired UE functional use, Impaired vision/preception, Obesity, Postural dysfunction, Improper body mechanics, Pain  Visit Diagnosis: Cervicalgia  Abnormal posture  Pain in thoracic spine     Problem List Patient Active Problem List   Diagnosis Date Noted  . Diarrhea 04/11/2020  . Epigastric pain 02/19/2020  . Dark stools 02/19/2020  . PAF (paroxysmal atrial fibrillation) ([place]) 02/05/2019  . HPV (human papilloma virus) infection 12/01/2016  . Screening mammogram, encounter for 11/29/2016  . Estrogen deficiency 11/29/2016  . Encounter for routine gynecological examination 11/29/2016  . Grade III hemorrhoids 10/06/2016  . Tachycardia 08/14/2016  . Hemorrhoids 08/13/2016  . Atypical migraine 08/03/2016  . CAD (coronary artery disease)   . AAA (abdominal aortic aneurysm) ([place])   . Chronic combined systolic (congestive) and diastolic (congestive) heart failure ([place])   . Prediabetes 02/11/2016  . Generalized anxiety disorder 12/08/2015  . Panic attacks 07/28/2015  . Sensorineural hearing loss 03/27/2013  . Vitamin D deficiency 07/03/2012  . H/O aortic dissection 05/12/2012  . Class 2 obesity due to excess calories with body mass index (BMI) of 35.0 to 35.9 in adult 11/21/2011  . Obesity (BMI 30.0-34.9) 11/21/2011  . Routine general medical examination at a health care facility 09/20/2011  . S/P AVR (aortic valve replacement) 01/07/2011  . CORONARY ARTERY BYPASS GRAFT, HX OF 01/07/2011  . Arthritis 12/20/2010  . Back pain 12/15/2010  . H/O dissecting abdominal aortic aneurysm repair 08/24/2010  . IRRITABLE BOWEL SYNDROME 09/09/2009  . Obstructive sleep  apnea 08/04/2009  . External hemorrhoid 06/26/2009  . Osteoporosis 01/09/2009  . Hyperlipidemia 07/26/2007  . Depression with anxiety 07/26/2007  . Essential hypertension 07/26/2007  . Allergic rhinitis 07/26/2007  . Asthma 07/26/2007  . GERD 07/26/2007  . Osteoarthritis 07/26/2007  . Ocular migraine 07/26/2007  . Mild intermittent asthma without complication [id number]   Janna Arch, PT, DPT   05/20/2020, 9:30 PM  [place] MAIN [facility] SERVICES [address] [place], [place], [address] Phone: [phone]   Fax:  [phone]  Name: Rachel Torres MRN: [id number] Date of Birth: 1956-06-30

## 2020-05-20 NOTE — Progress Notes (Signed)
Chief Complaint:   OBESITY Rachel Torres is here to discuss her progress with her obesity treatment plan along with follow-up of her obesity related diagnoses. Rachel Torres is on the Category 1 Plan and states she is following her eating plan approximately 75-80% of the time. Rachel Torres states she is exercising 0 minutes 0 times per week.  Today's visit was #: 2 Starting weight: 213 lbs Starting date: 05/05/2020 Today's weight: 205 lbs Today's date: 05/20/2020 Total lbs lost to date: 8 Total lbs lost since last in-office visit: 8  Interim History: Rachel Torres is down 8 lbs and doing well. It is not "easy" always with other people in the house to be aware of for meals. She sometimes goes out to eat too frequently.   Subjective:   Prediabetes. Rachel Torres has a diagnosis of prediabetes based on her elevated HgA1c and was informed this puts her at greater risk of developing diabetes. She continues to work on diet and exercise to decrease her risk of diabetes. She denies nausea or hypoglycemia.  Lab Results  Component Value Date   HGBA1C 5.9 (H) 05/05/2020   Lab Results  Component Value Date   INSULIN 25.2 (H) 05/05/2020   Other hyperlipidemia. Rachel Torres is taking Lipitor.   Lab Results  Component Value Date   CHOL 195 05/05/2020   HDL 56 05/05/2020   LDLCALC 116 (H) 05/05/2020   LDLDIRECT 189.4 10/13/2011   TRIG 133 05/05/2020   CHOLHDL 3 06/07/2019   Lab Results  Component Value Date   ALT 23 02/19/2020   AST 22 02/19/2020   ALKPHOS 67 02/19/2020   BILITOT 0.6 02/19/2020   The 10-year ASCVD risk score Mikey Bussing DC Jr., et al., 2013) is: 5.1%   Values used to calculate the score:     Age: 64 years     Sex: Female     Is Non-Hispanic African American: No     Diabetic: No     Tobacco smoker: No     Systolic Blood Pressure: 0000000 mmHg     Is BP treated: Yes     HDL Cholesterol: 56 mg/dL     Total Cholesterol: 195 mg/dL  Osteopenia, unspecified location. Rachel Torres is taking Vitamin D.  She reports fracturing her right shoulder and left wrist in 2009.  Candidiasis, under abdomen. Rachel Torres has candidiasis under her abdomen.  At risk for osteoporosis. Rachel Torres is at higher risk of osteopenia and osteoporosis due to osteopenia.  Assessment/Plan:   Prediabetes. Aneshia will continue to work on weight loss, exercise, increasing healthy fats and protein, and decreasing simple carbohydrates to help decrease the risk of diabetes.   Other hyperlipidemia. Cardiovascular risk and specific lipid/LDL goals reviewed.  We discussed several lifestyle modifications today and Rachel Torres will continue to work on diet, exercise and weight loss efforts. Orders and follow up as documented in patient record. She will continue her medication as directed.   Counseling Intensive lifestyle modifications are the first line treatment for this issue. . Dietary changes: Increase soluble fiber. Decrease simple carbohydrates. . Exercise changes: Moderate to vigorous-intensity aerobic activity 150 minutes per week if tolerated. . Lipid-lowering medications: see documented in medical record.  Osteopenia, unspecified location. Will schedule for a DG Bone Density. She will take OTC calcium 1200 mg (Viactiv).  Candidiasis, under abdomen. Prescription was given for fluconazole (DIFLUCAN) 150 MG tablet once and repeat in 1 week if needed #2 with 0 refills.  At risk for osteoporosis. Rachel Torres was given approximately 15 minutes of osteoporosis prevention  counseling today. Rachel Torres is at risk for osteopenia and osteoporosis due to her Vitamin D deficiency. She was encouraged to take her Vitamin D and follow her higher calcium diet and increase strengthening exercise to help strengthen her bones and decrease her risk of osteopenia and osteoporosis.  Repetitive spaced learning was employed today to elicit superior memory formation and behavioral change.  Class 1 obesity with serious comorbidity and body mass index (BMI) of  34.0 to 34.9 in adult, unspecified obesity type.  Rachel Torres is currently in the action stage of change. As such, her goal is to continue with weight loss efforts. She has agreed to the Category 1 Plan.   She will work on meal planning.  We reviewed with the patient labs from 05/05/2020 including lipids, Vitamin D, A1c, insulin, and thyroid panel.  Exercise goals: All adults should avoid inactivity. Some physical activity is better than none, and adults who participate in any amount of physical activity gain some health benefits.  Behavioral modification strategies: increasing lean protein intake, decreasing simple carbohydrates, increasing vegetables, increasing water intake, decreasing eating out, no skipping meals, meal planning and cooking strategies, keeping healthy foods in the home and planning for success.  Rachel Torres has agreed to follow-up with our clinic in 2 weeks. She was informed of the importance of frequent follow-up visits to maximize her success with intensive lifestyle modifications for her multiple health conditions.   Objective:   Blood pressure (!) 125/97, pulse 77, temperature 97.7 F (36.5 C), height 5\' 5"  (1.651 m), weight 205 lb (93 kg), SpO2 96 %. Body mass index is 34.11 kg/m.  General: Cooperative, alert, well developed, in no acute distress. HEENT: Conjunctivae and lids unremarkable. Cardiovascular: Regular rhythm.  Lungs: Normal work of breathing. Neurologic: No focal deficits.   Lab Results  Component Value Date   CREATININE 0.91 02/19/2020   BUN 12 02/19/2020   NA 140 02/19/2020   K 4.1 02/19/2020   CL 102 02/19/2020   CO2 31 02/19/2020   Lab Results  Component Value Date   ALT 23 02/19/2020   AST 22 02/19/2020   ALKPHOS 67 02/19/2020   BILITOT 0.6 02/19/2020   Lab Results  Component Value Date   HGBA1C 5.9 (H) 05/05/2020   HGBA1C 6.0 06/07/2019   HGBA1C 6.1 06/26/2018   HGBA1C 6.0 11/24/2017   HGBA1C 5.5 11/23/2016   Lab Results   Component Value Date   INSULIN 25.2 (H) 05/05/2020   Lab Results  Component Value Date   TSH 1.040 05/05/2020   Lab Results  Component Value Date   CHOL 195 05/05/2020   HDL 56 05/05/2020   LDLCALC 116 (H) 05/05/2020   LDLDIRECT 189.4 10/13/2011   TRIG 133 05/05/2020   CHOLHDL 3 06/07/2019   Lab Results  Component Value Date   WBC 5.8 02/19/2020   HGB 13.0 02/19/2020   HCT 38.5 02/19/2020   MCV 95.4 02/19/2020   PLT 284.0 02/19/2020   Lab Results  Component Value Date   FERRITIN 105.0 12/11/2010   Attestation Statements:   Reviewed by clinician on day of visit: allergies, medications, problem list, medical history, surgical history, family history, social history, and previous encounter notes.  Time spent on visit including pre-visit chart review and post-visit charting and care was 30 minutes.   Migdalia Dk, am acting as Location manager for [employer], DO   I have reviewed the above documentation for accuracy and completeness, and I agree with the above. Jearld Lesch, DO

## 2020-05-21 ENCOUNTER — Ambulatory Visit: Payer: Medicare HMO

## 2020-05-21 ENCOUNTER — Ambulatory Visit (INDEPENDENT_AMBULATORY_CARE_PROVIDER_SITE_OTHER): Payer: Medicare HMO | Admitting: Psychology

## 2020-05-21 DIAGNOSIS — F4323 Adjustment disorder with mixed anxiety and depressed mood: Secondary | ICD-10-CM | POA: Diagnosis not present

## 2020-05-26 ENCOUNTER — Other Ambulatory Visit: Payer: Self-pay

## 2020-05-26 ENCOUNTER — Ambulatory Visit: Payer: Medicare HMO

## 2020-05-26 DIAGNOSIS — G8929 Other chronic pain: Secondary | ICD-10-CM | POA: Diagnosis not present

## 2020-05-26 DIAGNOSIS — M542 Cervicalgia: Secondary | ICD-10-CM

## 2020-05-26 DIAGNOSIS — R293 Abnormal posture: Secondary | ICD-10-CM

## 2020-05-26 DIAGNOSIS — M546 Pain in thoracic spine: Secondary | ICD-10-CM | POA: Diagnosis not present

## 2020-05-26 DIAGNOSIS — M545 Low back pain: Secondary | ICD-10-CM | POA: Diagnosis not present

## 2020-05-27 NOTE — Therapy (Signed)
[place] MAIN [facility] SERVICES [place], [place], [address] Phone: [phone]   Fax:  [phone]  Physical Therapy Treatment  Patient Details  Name: Rachel Torres MRN: [id number] Date of Birth: Sep 28, 1956 Referring Provider (PT): Glori Bickers, Wynelle Fanny MD   Encounter Date: 05/26/2020  PT End of Session - 05/27/20 0756    Visit Number  3    Number of Visits  16    Date for PT Re-Evaluation  07/09/20    Authorization Type  2/10 eval 05/14/20    PT Start Time  0230    PT Stop Time  0315    PT Time Calculation (min)  45 min    Activity Tolerance  Patient tolerated treatment well;Patient limited by pain    Behavior During Therapy  [facility] for tasks assessed/performed       Past Medical History:  Diagnosis Date  . AAA (abdominal aortic aneurysm) ([place])    a. Chronic w/o evidence of aneurysmal dil on CTA 01/2016.  Marland Kitchen Alcohol abuse   . Allergy   . Anemia   . Anxiety   . CAD (coronary artery disease)    a. 08/2010 s/p CABG x 1 (VG->RCA) @ time of Ao dissection repair; b. 01/2016 Lexiscan MV: mid antsept/apical defect w/ ? peri-infarct ischemia-->likely attenuation-->Med Rx.  . Chewing difficulty   . Chronic combined systolic (congestive) and diastolic (congestive) heart failure ([place])    a. 2012 EF 30-35%; b. 04/2015 EF 40-45%; c. 01/2016 Echo: Ef 55-65%, nl AoV bioprosthesis, nl RV, nl PASP.  Marland Kitchen Constipation   . Depression   . Edema of both lower extremities   . Gallbladder sludge   . GERD (gastroesophageal reflux disease)   . Heart failure ([place])   . Heart valve problem   . History of Bicuspid Aortic Valve    a. 08/2010 s/p AVR @ time of Ao dissection repair; b. 01/2016 Echo: Ef 55-65%, nl AoV bioprosthesis, nl RV, nl PASP.  Marland Kitchen Hx of repair of dissecting thoracic aortic aneurysm, [place] type A    a. 08/2010 s/p repair with AVR and VG->RCA; b. 01/2016 CTA: stable appearance of Asc Thoracic Aortic graft. Opacification of flase lumen of chronic  abd Ao dissection w/ retrograde flow through lumbar arteries. No aneurysmal dil of flase lumen.  . Hyperlipidemia   . Hypertensive heart disease   . IBS (irritable bowel syndrome)   . Internal hemorrhoids   . Kidney problem   . Marfan syndrome    pt denies  . Migraine   . Obstructive sleep apnea   . Osteoarthritis   . Osteoporosis   . Palpitations   . Pneumonia   . RA (rheumatoid arthritis) ([place])    a. Followed by Dr. [name]    Past Surgical History:  Procedure Laterality Date  . ABDOMINAL AORTIC ANEURYSM REPAIR    . CARDIAC CATHETERIZATION  2001  . [place]  . CHOLECYSTECTOMY    . COLONOSCOPY    . ESOPHAGOGASTRODUODENOSCOPY (EGD) WITH PROPOFOL N/A 05/08/2020   Procedure: ESOPHAGOGASTRODUODENOSCOPY (EGD) WITH PROPOFOL;  Surgeon: Virgel Manifold, MD;  Location: [facility] ENDOSCOPY;  Service: Endoscopy;  Laterality: N/A;  . FRACTURE SURGERY Right    shoulder replacement  . HEMORRHOID BANDING    . ORIF DISTAL RADIUS FRACTURE Left   . UPPER GASTROINTESTINAL ENDOSCOPY      There were no vitals filed for this visit.  Subjective Assessment - 05/27/20 0753    Subjective  Pt reports  she had an ocular migraine yesterday.  She reports that her R cervical region is more tender today vs. L.    Pertinent History  Patient has been seen by this therapist in the past for back related injuries/pain. Was last seen by this author in December of 2019. Since discharge patient has had COVID and an upper GI due to stomach issues. PMH includes aorta type A dissection (2011) with aortic valve replacement, bypass of R Coronary artery, hypertension, hyperlipidemia, CHF, pre-diabetes, AFIB, anxiety, depression, arthritis, GERD, atypical migraine, asthma, allergic rhinitis. 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 Does now have help with aide.    Limitations  Sitting;Reading;Lifting;Standing;Walking;Writing;Other (comment);House hold activities    How long can you sit comfortably?   20 mins before whole body discomfort    How long can you stand comfortably?  5 mins  or less: lower back    How long can you walk comfortably?  2 minutes    Patient Stated Goals  to decrease pain and migraines    Currently in Pain?  Yes    Pain Score  5     Pain Location  Head    Pain Descriptors / Indicators  Aching    Pain Type  Chronic pain    Pain Onset  More than a month ago    Pain Frequency  Intermittent        Treatment:  STM to B lev scaps, U/T's, scalenes, cervical paraspinals and throacic paraspinals.  Supine manual SOR and manual B U/T and lev scap stretches.  Gr.2/3 rotational cervical mobs and PA glides throughout cervical spine.  Gr. 2/3 prone PA glides to thoracic facet joints.  Supine chin tucks 20x; seated scap pinches 20x; seated RTB rows 20x; seated U/T and lev scap stretches 3x20 sec each.                        PT Education - 05/27/20 0755    Education Details  seated lev scap stretch and U/T stretch for home use    Person(s) Educated  Patient    Methods  Explanation;Demonstration;Tactile cues;Verbal cues    Comprehension  Verbalized understanding;Returned demonstration       PT Short Term Goals - 05/14/20 1617      PT SHORT TERM GOAL #1   Title  Patient will be independent in home exercise program to improve strength/mobility and decrease pain for better functional independence with ADLs.    Baseline  5/26: HEP next session    Time  2    Period  Weeks    Status  New    Target Date  05/28/20      PT SHORT TERM GOAL #2   Title  Patient will report a worst pain of 7/10 on VAS in head to improve tolerance with ADLs and reduced symptoms with activities.    Baseline  5/26: 9/10    Time  2    Period  Weeks    Status  New    Target Date  05/28/20        PT Long Term Goals - 05/14/20 1623      PT LONG TERM GOAL #1   Title  Patient will report a worst pain of 3/10 on VAS in her head/migraines to improve tolerance with ADLs and  reduced symptoms with activities    Baseline  5/26: 9/10    Time  8    Period  Weeks    Status  New    Target Date  07/09/20      PT LONG TERM GOAL #2   Title  Patient will increase FOTO score to equal to or greater than   45/100  to demonstrate statistically significant improvement in mobility and quality of life.    Baseline  5/26: 44 with risk adjusted 33/100    Time  8    Period  Weeks    Status  New    Target Date  07/09/20      PT LONG TERM GOAL #3   Title  Patient will reduce dizziness handicap inventory score to <30, for less dizziness with ADLs and increased safety with home and work tasks.    Baseline  5/26: 40    Time  8    Period  Weeks    Status  New    Target Date  07/09/20      PT LONG TERM GOAL #4   Title  Patient will improve cervical rotation ROM to >/=60 degrees bilaterally to improve functional mobility and demonstrate improved muscle tissue length    Baseline  5/26: R 46 L 42    Time  8    Period  Weeks    Status  New    Target Date  07/09/20            Plan - 05/27/20 0756    Clinical Impression Statement  Pt presents with palpable roping in R lev scap muscle at today's session.  She also c/o spasming in L mid-thoracic paraspinals when initially in prone position during session.  She reported decreased cervical tension and head/neck pain by 2 points at end of session.    Personal Factors and Comorbidities  Age;Comorbidity 3+;Fitness;Finances;Past/Current Experience;Social Background;Time since onset of injury/illness/exacerbation;Other    Comorbidities  aorta type A dissection (2011) with aortic valve replacement, bypass of R Coronary artery, hypertension, hyperlipidemia, CHF, pre-diabetes, AFIB, anxiety, depression, arthritis, GERD, atypical migraine, asthma, allergic rhinitis.    Examination-Activity Limitations  Bend;Caring for Others;Carry;Dressing;Stairs;Squat;Sit;Reach Overhead;Locomotion Level;Lift;Stand;Toileting;Transfers     Examination-Participation Restrictions  Church;Cleaning;Community Activity;Interpersonal Relationship;Driving;Laundry;Volunteer;Shop;Personal Finances;Meal Prep;Medication Management;Yard Work    [employer] officer  Evolving/Moderate complexity    Clinical Decision Making  Moderate    Rehab Potential  Fair    Clinical Impairments Affecting Rehab Potential  + CAD testing, history of AAA, anemia, anxiety, asthma, CAD, chronic combined ystolic heart failure, GERD, Marfan syndrome, OA, RA (+) good work Psychologist, forensic, motivated, age     PT Frequency  2x / week    PT Duration  8 weeks    PT Treatment/Interventions  ADLs/Self Care Home Management;Aquatic Therapy;Vestibular;Vasopneumatic Device;Taping;Passive range of motion;Dry needling;Energy conservation;Manual techniques;Therapeutic exercise;Balance training;Neuromuscular re-education;Patient/family education;Therapeutic activities;Functional mobility training;Stair training;Gait training;Electrical Stimulation;Iontophoresis 4mg /ml Dexamethasone;Moist Heat;Traction;Ultrasound;Cryotherapy;Canalith Repostioning;Joint Manipulations;Spinal Manipulations    PT Next Visit Plan  cervical muscle tissue lengthening, give HEP,    PT Home Exercise Plan  Educated in seated Lev Scap and U/T stretches for HEP, as well as scap pinches and chin tucks.    Consulted and Agree with Plan of Care  Patient       Patient will benefit from skilled therapeutic intervention in order to improve the following deficits and impairments:  Cardiopulmonary status limiting activity, Decreased activity tolerance, Decreased endurance, Decreased mobility, Decreased range of motion, Decreased strength, Difficulty walking, Dizziness, Hypomobility, Impaired flexibility, Impaired perceived functional ability, Increased muscle spasms, Impaired UE functional use, Impaired vision/preception, Obesity, Postural dysfunction, Improper body mechanics, Pain  Visit  Diagnosis: Cervicalgia  Abnormal posture  Pain in thoracic spine     Problem List Patient Active Problem List   Diagnosis Date Noted  . Diarrhea 04/11/2020  . Epigastric pain 02/19/2020  . Dark stools 02/19/2020  . PAF (paroxysmal atrial fibrillation) ([place]) 02/05/2019  . HPV (human papilloma virus) infection 12/01/2016  . Screening mammogram, encounter for 11/29/2016  . Estrogen deficiency 11/29/2016  . Encounter for routine gynecological examination 11/29/2016  . Grade III hemorrhoids 10/06/2016  . Tachycardia 08/14/2016  . Hemorrhoids 08/13/2016  . Atypical migraine 08/03/2016  . CAD (coronary artery disease)   . AAA (abdominal aortic aneurysm) ([place])   . Chronic combined systolic (congestive) and diastolic (congestive) heart failure ([place])   . Prediabetes 02/11/2016  . Generalized anxiety disorder 12/08/2015  . Panic attacks 07/28/2015  . Sensorineural hearing loss 03/27/2013  . Vitamin D deficiency 07/03/2012  . H/O aortic dissection 05/12/2012  . Class 2 obesity due to excess calories with body mass index (BMI) of 35.0 to 35.9 in adult 11/21/2011  . Obesity (BMI 30.0-34.9) 11/21/2011  . Routine general medical examination at a health care facility 09/20/2011  . S/P AVR (aortic valve replacement) 01/07/2011  . CORONARY ARTERY BYPASS GRAFT, HX OF 01/07/2011  . Arthritis 12/20/2010  . Back pain 12/15/2010  . H/O dissecting abdominal aortic aneurysm repair 08/24/2010  . IRRITABLE BOWEL SYNDROME 09/09/2009  . Obstructive sleep apnea 08/04/2009  . External hemorrhoid 06/26/2009  . Osteoporosis 01/09/2009  . Hyperlipidemia 07/26/2007  . Depression with anxiety 07/26/2007  . Essential hypertension 07/26/2007  . Allergic rhinitis 07/26/2007  . Asthma 07/26/2007  . GERD 07/26/2007  . Osteoarthritis 07/26/2007  . Ocular migraine 07/26/2007  . Mild intermittent asthma without complication 57/50/5183    Kiing Deakin, MPT 05/27/2020, 8:05 AM  [place] MAIN [facility] SERVICES [address] [place], [place], [address] Phone: [phone]   Fax:  [phone]  Name: JASAMINE POTTINGER MRN: [id number] Date of Birth: Jan 20, 1956

## 2020-05-28 ENCOUNTER — Encounter: Payer: Self-pay | Admitting: Family Medicine

## 2020-05-28 ENCOUNTER — Telehealth (INDEPENDENT_AMBULATORY_CARE_PROVIDER_SITE_OTHER): Payer: Medicare HMO | Admitting: Family Medicine

## 2020-05-28 DIAGNOSIS — J209 Acute bronchitis, unspecified: Secondary | ICD-10-CM | POA: Diagnosis not present

## 2020-05-28 MED ORDER — DEXAMETHASONE 6 MG PO TABS
ORAL_TABLET | ORAL | 0 refills | Status: DC
Start: 2020-05-28 — End: 2020-06-03

## 2020-05-28 MED ORDER — DOXYCYCLINE HYCLATE 100 MG PO TABS: 100.0000 mg | ORAL_TABLET | Freq: Two times a day (BID) | ORAL | 0 refills | Status: DC

## 2020-05-28 NOTE — Patient Instructions (Signed)
Drink fluids and rest  Take dexamethasone for wheezing and tight chest The albuterol nebulizer treatments are a good idea  Take doxycycline as directed  Continue mucinex if helpful  Watch your temp and pulse ox   If worse - especially wheezing or short of breath please alert [place] and go to an urgent care or ER to be evaluated in person

## 2020-05-28 NOTE — Progress Notes (Signed)
Virtual Visit via Video Note  I connected with Rachel Torres on 05/28/20 at  3:00 PM EDT by a video enabled telemedicine application and verified that I am speaking with the correct person using two identifiers.  Location: Patient: home Provider: office    I discussed the limitations of evaluation and management by telemedicine and the availability of in person appointments. The patient expressed understanding and agreed to proceed.  Parties involved in encounter  Patient: Hydrologist  Provider:  Loura Pardon MD    History of Present Illness: Pt presents with uri symptoms   Temp: (!) 97.1 F (36.2 C) pulse ox 94% RA at home   she is immunized for covid   Woke up Monday with mild ST, it improved  Then nasal congestion  Then cough -painful / can hear rattling when she lies down  Some phlegm - green to white  Worse at night   Has a headache -naproxen helps  Both sinus pain and headache   No fever  No change in taste or smell but it never came back   Some wheezing/tight chest  Using her albuterol inhaler (NMT -has if she needs it) Has some symbicort (not using) from prior illness   Was around mother with pneumonia  Sister has a cold  Both neg for covid   She is allergic to prednisone (rash) but took oral dexamethasone after last hosp [facility] for [facility] and did tolerate that well   Little diarrhea yesterday/not today   Otc:  Naproxen  mucinex - dm  Had chlorcedin  Patient Active Problem List   Diagnosis Date Noted  . Diarrhea 04/11/2020  . Epigastric pain 02/19/2020  . Dark stools 02/19/2020  . PAF (paroxysmal atrial fibrillation) ([place]) 02/05/2019  . HPV (human papilloma virus) infection 12/01/2016  . Screening mammogram, encounter for 11/29/2016  . Estrogen deficiency 11/29/2016  . Encounter for routine gynecological examination 11/29/2016  . Grade III hemorrhoids 10/06/2016  . Tachycardia 08/14/2016  . Hemorrhoids 08/13/2016  . Atypical migraine  08/03/2016  . CAD (coronary artery disease)   . AAA (abdominal aortic aneurysm) ([place])   . Chronic combined systolic (congestive) and diastolic (congestive) heart failure ([place])   . Prediabetes 02/11/2016  . Generalized anxiety disorder 12/08/2015  . Panic attacks 07/28/2015  . Sensorineural hearing loss 03/27/2013  . Vitamin D deficiency 07/03/2012  . H/O aortic dissection 05/12/2012  . Class 2 obesity due to excess calories with body mass index (BMI) of 35.0 to 35.9 in adult 11/21/2011  . Obesity (BMI 30.0-34.9) 11/21/2011  . Routine general medical examination at a health care facility 09/20/2011  . S/P AVR (aortic valve replacement) 01/07/2011  . CORONARY ARTERY BYPASS GRAFT, HX OF 01/07/2011  . Arthritis 12/20/2010  . Back pain 12/15/2010  . H/O dissecting abdominal aortic aneurysm repair 08/24/2010  . IRRITABLE BOWEL SYNDROME 09/09/2009  . Obstructive sleep apnea 08/04/2009  . External hemorrhoid 06/26/2009  . Osteoporosis 01/09/2009  . Hyperlipidemia 07/26/2007  . Depression with anxiety 07/26/2007  . Essential hypertension 07/26/2007  . Allergic rhinitis 07/26/2007  . Asthma 07/26/2007  . GERD 07/26/2007  . Osteoarthritis 07/26/2007  . Ocular migraine 07/26/2007  . Mild intermittent asthma without complication 96/03/5408   Past Medical History:  Diagnosis Date  . AAA (abdominal aortic aneurysm) ([place])    a. Chronic w/o evidence of aneurysmal dil on CTA 01/2016.  Marland Kitchen Alcohol abuse   . Allergy   . Anemia   . Anxiety   . CAD (coronary artery  disease)    a. 08/2010 s/p CABG x 1 (VG->RCA) @ time of Ao dissection repair; b. 01/2016 Lexiscan MV: mid antsept/apical defect w/ ? peri-infarct ischemia-->likely attenuation-->Med Rx.  . Chewing difficulty   . Chronic combined systolic (congestive) and diastolic (congestive) heart failure ([place])    a. 2012 EF 30-35%; b. 04/2015 EF 40-45%; c. 01/2016 Echo: Ef 55-65%, nl AoV bioprosthesis, nl RV, nl PASP.  Marland Kitchen Constipation   . Depression    . Edema of both lower extremities   . Gallbladder sludge   . GERD (gastroesophageal reflux disease)   . Heart failure ([place])   . Heart valve problem   . History of Bicuspid Aortic Valve    a. 08/2010 s/p AVR @ time of Ao dissection repair; b. 01/2016 Echo: Ef 55-65%, nl AoV bioprosthesis, nl RV, nl PASP.  Marland Kitchen Hx of repair of dissecting thoracic aortic aneurysm, [place] type A    a. 08/2010 s/p repair with AVR and VG->RCA; b. 01/2016 CTA: stable appearance of Asc Thoracic Aortic graft. Opacification of flase lumen of chronic abd Ao dissection w/ retrograde flow through lumbar arteries. No aneurysmal dil of flase lumen.  . Hyperlipidemia   . Hypertensive heart disease   . IBS (irritable bowel syndrome)   . Internal hemorrhoids   . Kidney problem   . Marfan syndrome    pt denies  . Migraine   . Obstructive sleep apnea   . Osteoarthritis   . Osteoporosis   . Palpitations   . Pneumonia   . RA (rheumatoid arthritis) ([place])    a. Followed by Dr. [name]   Past Surgical History:  Procedure Laterality Date  . ABDOMINAL AORTIC ANEURYSM REPAIR    . CARDIAC CATHETERIZATION  2001  . [place]  . CHOLECYSTECTOMY    . COLONOSCOPY    . ESOPHAGOGASTRODUODENOSCOPY (EGD) WITH PROPOFOL N/A 05/08/2020   Procedure: ESOPHAGOGASTRODUODENOSCOPY (EGD) WITH PROPOFOL;  Surgeon: Virgel Manifold, MD;  Location: [facility] ENDOSCOPY;  Service: Endoscopy;  Laterality: N/A;  . FRACTURE SURGERY Right    shoulder replacement  . HEMORRHOID BANDING    . ORIF DISTAL RADIUS FRACTURE Left   . UPPER GASTROINTESTINAL ENDOSCOPY     Social History   Tobacco Use  . Smoking status: Former Smoker    Packs/day: 0.75    Years: 1.50    Pack years: 1.12    Types: Cigarettes    Quit date: 12/20/1978    Years since quitting: 41.4  . Smokeless tobacco: Never Used  Substance Use Topics  . Alcohol use: Yes    Alcohol/week: 0.0 standard drinks  . Drug use: No   Family History  Problem Relation Age of  Onset  . Hypertension Mother   . Depression Mother   . Osteoporosis Mother   . Stroke Mother   . Irritable bowel syndrome Mother   . Asthma Mother   . Heart disease Mother   . Thyroid disease Mother   . Anxiety disorder Mother   . Arthritis Sister   . Diabetes Sister   . Heart disease Sister   . Asthma Sister   . Migraines Sister   . Nephrolithiasis Sister   . Osteoporosis Sister   . Arthritis Sister   . Asthma Sister   . Migraines Sister   . Other Father 92  . Stroke Maternal Grandmother   . Colon cancer Maternal Grandmother   . Lymphoma Maternal Grandmother   . Migraines Maternal Grandmother   . Aneurysm Paternal Grandmother  brain  . Diabetes Paternal Grandmother   . Arthritis Paternal Grandmother   . Arthritis Paternal Grandfather   . Diabetes Paternal Grandfather   . Other Brother        prediabeties  . Asthma Brother   . Migraines Brother   . Lung cancer Maternal Grandfather   . Melanoma Maternal Grandfather    Allergies  Allergen Reactions  . Fosamax [Alendronate Sodium]     GI upset   . Ginzing [Ginseng] Other (See Comments)    Hyper and jitery   . Hydrocod Polst-Cpm Polst Er Itching  . Hydrocodone-Acetaminophen Itching  . Imitrex [Sumatriptan]     Contraindicated w/ heart condition  . Oxycodone Itching  . Peanut-Containing Drug Products Other (See Comments)    Reaction:  Migraines   . Tramadol Other (See Comments)    itching  . Hydrocodone-Guaifenesin Itching  . Prednisone Rash  . Tape Itching   Current Outpatient Medications on File Prior to Visit  Medication Sig Dispense Refill  . albuterol (PROVENTIL HFA;VENTOLIN HFA) 108 (90 Base) MCG/ACT inhaler Inhale 1-2 puffs into the lungs every 6 (six) hours as needed for wheezing or shortness of breath. 1 Inhaler 0  . ALPRAZolam (XANAX) 0.5 MG tablet Take 1 tablet (0.5 mg total) by mouth daily as needed for anxiety. 30 tablet 0  . aspirin EC 81 MG tablet Take 81 mg by mouth at bedtime.    Marland Kitchen  atorvastatin (LIPITOR) 20 MG tablet TAKE 1 TABLET EVERY DAY 90 tablet 0  . carvedilol (COREG) 6.25 MG tablet TAKE 1 TABLET TWICE DAILY WITH MEALS 180 tablet 0  . Cholecalciferol (VITAMIN D3) 5000 units CAPS Take 1 capsule by mouth daily.    . cyclobenzaprine (FLEXERIL) 10 MG tablet Take 0.5-1 tablets (5-10 mg total) by mouth 3 (three) times daily as needed. For headache 90 tablet 1  . furosemide (LASIX) 20 MG tablet TAKE 1 TABLET (20 MG TOTAL) BY MOUTH DAILY. NEED MD APPOINTMENT 90 tablet 3  . Krill Oil (OMEGA-3) 500 MG CAPS Take 1 capsule by mouth daily.    Marland Kitchen lisinopril (ZESTRIL) 5 MG tablet TAKE 1/2 TABLET EVERY DAY 45 tablet 0  . magnesium gluconate (MAGONATE) 500 MG tablet Take 500 mg by mouth at bedtime.    . Misc Natural Products (TART CHERRY ADVANCED) CAPS Take 1 capsule by mouth daily.    . naproxen sodium (ANAPROX) 220 MG tablet Take 440 mg by mouth 2 (two) times daily as needed (for headaches/pain).    . nitroGLYCERIN (NITROSTAT) 0.4 MG SL tablet Place 1 tablet (0.4 mg total) under the tongue every 5 (five) minutes as needed for chest pain. 25 tablet 3  . omeprazole (PRILOSEC) 40 MG capsule TAKE 1 CAPSULE EVERY DAY (Patient taking differently: Take 40 mg by mouth in the morning and at bedtime. ) 90 capsule 1  . potassium chloride (K-DUR) 10 MEQ tablet Take 1 tablet (10 mEq total) by mouth 2 (two) times daily as needed. 180 tablet 3  . promethazine (PHENERGAN) 25 MG tablet Take 0.5 tablets (12.5 mg total) by mouth every 8 (eight) hours as needed for nausea. 90 tablet 1  . Simethicone (MYLANTA GAS PO) Take by mouth 3 times/day as needed-between meals & bedtime.    Marland Kitchen venlafaxine XR (EFFEXOR-XR) 150 MG 24 hr capsule TAKE 1 CAPSULE EVERY DAY WITH BREAKFAST 90 capsule 1   No current facility-administered medications on file prior to visit.   Review of Systems  Constitutional: Positive for malaise/fatigue. Negative for chills and  fever.  HENT: Positive for congestion and sore throat. Negative  for ear pain and sinus pain.   Eyes: Negative for blurred vision, discharge and redness.  Respiratory: Positive for cough, sputum production and wheezing. Negative for shortness of breath and stridor.   Cardiovascular: Negative for chest pain, palpitations and leg swelling.  Gastrointestinal: Negative for abdominal pain, diarrhea, nausea and vomiting.  Musculoskeletal: Negative for myalgias.  Skin: Negative for rash.  Neurological: Positive for headaches. Negative for dizziness.      Observations/Objective: Patient appears well, in no distress (but fatigued) Weight is baseline  No facial swelling or asymmetry Hoarse voice / occ sniffles  No obvious tremor or mobility impairment Moving neck and UEs normally Able to hear the call well  Frequent productive sounding cough/ no audible wheeze during interview and not sob at rest  Talkative and mentally sharp with no cognitive changes No skin changes on face or neck , no rash or pallor Affect is normal    Assessment and Plan: Problem List Items Addressed This Visit      Respiratory   Acute bronchitis with bronchospasm    With recent exp to [facility], and personal hx of past covid and vaccine  Wheezing/prod cough  Allergic to prednisone but has done well with dexamethasone in the past  Px this and also doxycycline  Disc poss of viral etiology but given recent exp to [facility] will cover with abx  Enc rest/fluids and expectorant  If worse will need to go to UC (pt pref [facility]) for in person eval  She has albuterol nmt to use prn Update if not starting to improve in a week or if worsening   Consider covid testing if worse or no imp            Follow Up Instructions: Drink fluids and rest  Take dexamethasone for wheezing and tight chest The albuterol nebulizer treatments are a good idea  Take doxycycline as directed  Continue mucinex if helpful  Watch your temp and pulse ox   If worse - especially wheezing or short of breath please alert  [place] and go to an urgent care or ER to be evaluated in person    I discussed the assessment and treatment plan with the patient. The patient was provided an opportunity to ask questions and all were answered. The patient agreed with the plan and demonstrated an understanding of the instructions.   The patient was advised to call back or seek an in-person evaluation if the symptoms worsen or if the condition fails to improve as anticipated.     Loura Pardon, MD

## 2020-05-28 NOTE — Assessment & Plan Note (Signed)
With recent exp to [facility], and personal hx of past covid and vaccine  Wheezing/prod cough  Allergic to prednisone but has done well with dexamethasone in the past  Px this and also doxycycline  Disc poss of viral etiology but given recent exp to [facility] will cover with abx  Enc rest/fluids and expectorant  If worse will need to go to UC (pt pref [facility]) for in person eval  She has albuterol nmt to use prn Update if not starting to improve in a week or if worsening   Consider covid testing if worse or no imp

## 2020-05-29 ENCOUNTER — Ambulatory Visit: Payer: Medicare HMO | Admitting: Family Medicine

## 2020-06-01 ENCOUNTER — Other Ambulatory Visit: Payer: Self-pay | Admitting: Cardiovascular Disease

## 2020-06-02 ENCOUNTER — Telehealth: Payer: Self-pay | Admitting: *Deleted

## 2020-06-02 ENCOUNTER — Encounter (INDEPENDENT_AMBULATORY_CARE_PROVIDER_SITE_OTHER): Payer: Self-pay | Admitting: Bariatrics

## 2020-06-02 NOTE — Telephone Encounter (Signed)
Patent called stating that she did a virtual visit Wednesday with Dr. Glori Bickers. Patient stated that she is not doing much better. Patient stated that her cough is still deep in her chest. Patient stated that she is due to take her last steroid pill tomorrow. Patient stated that she is real tired and not able to do much.  Patient wants to know what else she should do? Pharmacy [facility]

## 2020-06-02 NOTE — Telephone Encounter (Signed)
I want her to go to an UC that has the ability to do a cxr if needed    ([facility] generally prefers the  [facility])  She needs an exam/face to face visit

## 2020-06-02 NOTE — Telephone Encounter (Signed)
Pt notified of Dr. Marliss Coots comments. Pt will go to UC tomorrow. ER precautions given for today until she sees provider tomorrow

## 2020-06-03 ENCOUNTER — Other Ambulatory Visit: Payer: Self-pay

## 2020-06-03 ENCOUNTER — Ambulatory Visit: Payer: Medicare HMO

## 2020-06-03 ENCOUNTER — Ambulatory Visit (INDEPENDENT_AMBULATORY_CARE_PROVIDER_SITE_OTHER): Payer: Medicare HMO

## 2020-06-03 ENCOUNTER — Ambulatory Visit
Admission: RE | Admit: 2020-06-03 | Discharge: 2020-06-03 | Disposition: A | Discharge status: Home / Self Care | Payer: Medicare HMO | Source: Ambulatory Visit | Attending: Nurse Practitioner | Admitting: Nurse Practitioner

## 2020-06-03 VITALS — BP 107/40 | HR 85 | Temp 98.0°F | Resp 16 | Ht 65.0 in | Wt 204.0 lb

## 2020-06-03 DIAGNOSIS — J209 Acute bronchitis, unspecified: Secondary | ICD-10-CM

## 2020-06-03 DIAGNOSIS — R05 Cough: Secondary | ICD-10-CM | POA: Diagnosis not present

## 2020-06-03 MED ORDER — METHYLPREDNISOLONE SODIUM SUCC 40 MG IJ SOLR
80.0000 mg | Freq: Once | INTRAMUSCULAR | Status: AC
  Administered 2020-06-03: 80 mg via INTRAMUSCULAR

## 2020-06-03 MED ORDER — ALBUTEROL SULFATE (5 MG/ML) 0.5% IN NEBU: 2.5000 mg | INHALATION_SOLUTION | Freq: Four times a day (QID) | RESPIRATORY_TRACT | 12 refills | Status: DC | PRN

## 2020-06-03 MED ORDER — PROMETHAZINE-DM 6.25-15 MG/5ML PO SYRP
5.0000 mL | ORAL_SOLUTION | Freq: Four times a day (QID) | ORAL | 0 refills | Status: DC | PRN
Start: 2020-06-03 — End: 2020-07-23

## 2020-06-03 MED ORDER — DM-GUAIFENESIN ER 30-600 MG PO TB12: 1.0000 | ORAL_TABLET | Freq: Two times a day (BID) | ORAL | 0 refills | Status: AC

## 2020-06-03 MED ORDER — LEVOFLOXACIN 500 MG PO TABS
500.0000 mg | ORAL_TABLET | Freq: Every day | ORAL | 0 refills | Status: DC
Start: 2020-06-03 — End: 2020-07-01

## 2020-06-03 NOTE — ED Triage Notes (Signed)
Patient complains of cough x 1 week. Patient states that she feels like she has chest congestion. States that she has recently finished Decadron and has 2 more dosages of Doxycycline but has not noticed any improvement.

## 2020-06-03 NOTE — ED Provider Notes (Signed)
[facility]    CSN: [id number] Arrival date & time: 06/03/20  1030      History   Chief Complaint Chief Complaint  Patient presents with  . Cough    HPI Rachel Torres is a 64 y.o. female.   Subjective:   Rachel Torres is a 64 y.o. female here for evaluation of a cough. Onset of symptoms was 1 week ago and has been  unchanged since that time. The cough is described as harsh and is minimally productive.  Associated symptoms include fatigue, shortness of breath and chest pain during the cough. She denies any fevers, sweats, chills, body aches, palpitations, headache or dizziness. The patient does not have a history of asthma. Patient has not had recent travel. Patient does not have a history of smoking. The has been completely vaccinated for COVID-19 back in March 2021.  Notably, the patient was seen virtually by her PCP about a day after symptom onset.  She was prescribed Decadron and doxycycline.  She has completed the Decadron and has 2 doses of the doxycycline left.  Patient states that her symptoms have not improved but has not worsened.    The following portions of the patient's history were reviewed and updated as appropriate: allergies, current medications, past family history, past medical history, past social history, past surgical history and problem list.       Past Medical History:  Diagnosis Date  . AAA (abdominal aortic aneurysm) ([place])    a. Chronic w/o evidence of aneurysmal dil on CTA 01/2016.  Marland Kitchen Alcohol abuse   . Allergy   . Anemia   . Anxiety   . CAD (coronary artery disease)    a. 08/2010 s/p CABG x 1 (VG->RCA) @ time of Ao dissection repair; b. 01/2016 Lexiscan MV: mid antsept/apical defect w/ ? peri-infarct ischemia-->likely attenuation-->Med Rx.  . Chewing difficulty   . Chronic combined systolic (congestive) and diastolic (congestive) heart failure ([place])    a. 2012 EF 30-35%; b. 04/2015 EF 40-45%; c. 01/2016 Echo: Ef 55-65%, nl AoV  bioprosthesis, nl RV, nl PASP.  Marland Kitchen Constipation   . Depression   . Edema of both lower extremities   . Gallbladder sludge   . GERD (gastroesophageal reflux disease)   . Heart failure ([place])   . Heart valve problem   . History of Bicuspid Aortic Valve    a. 08/2010 s/p AVR @ time of Ao dissection repair; b. 01/2016 Echo: Ef 55-65%, nl AoV bioprosthesis, nl RV, nl PASP.  Marland Kitchen Hx of repair of dissecting thoracic aortic aneurysm, [place] type A    a. 08/2010 s/p repair with AVR and VG->RCA; b. 01/2016 CTA: stable appearance of Asc Thoracic Aortic graft. Opacification of flase lumen of chronic abd Ao dissection w/ retrograde flow through lumbar arteries. No aneurysmal dil of flase lumen.  . Hyperlipidemia   . Hypertensive heart disease   . IBS (irritable bowel syndrome)   . Internal hemorrhoids   . Kidney problem   . Marfan syndrome    pt denies  . Migraine   . Obstructive sleep apnea   . Osteoarthritis   . Osteoporosis   . Palpitations   . Pneumonia   . RA (rheumatoid arthritis) ([place])    a. Followed by Dr. [name]    Patient Active Problem List   Diagnosis Date Noted  . Acute bronchitis with bronchospasm 05/28/2020  . Diarrhea 04/11/2020  . Epigastric pain 02/19/2020  . Dark stools 02/19/2020  . PAF (paroxysmal atrial  fibrillation) ([place]) 02/05/2019  . HPV (human papilloma virus) infection 12/01/2016  . Screening mammogram, encounter for 11/29/2016  . Estrogen deficiency 11/29/2016  . Encounter for routine gynecological examination 11/29/2016  . Grade III hemorrhoids 10/06/2016  . Tachycardia 08/14/2016  . Hemorrhoids 08/13/2016  . Atypical migraine 08/03/2016  . CAD (coronary artery disease)   . AAA (abdominal aortic aneurysm) ([place])   . Chronic combined systolic (congestive) and diastolic (congestive) heart failure ([place])   . Prediabetes 02/11/2016  . Generalized anxiety disorder 12/08/2015  . Panic attacks 07/28/2015  . Sensorineural hearing loss 03/27/2013  . Vitamin D  deficiency 07/03/2012  . H/O aortic dissection 05/12/2012  . Class 2 obesity due to excess calories with body mass index (BMI) of 35.0 to 35.9 in adult 11/21/2011  . Obesity (BMI 30.0-34.9) 11/21/2011  . Routine general medical examination at a health care facility 09/20/2011  . S/P AVR (aortic valve replacement) 01/07/2011  . CORONARY ARTERY BYPASS GRAFT, HX OF 01/07/2011  . Arthritis 12/20/2010  . Back pain 12/15/2010  . H/O dissecting abdominal aortic aneurysm repair 08/24/2010  . IRRITABLE BOWEL SYNDROME 09/09/2009  . Obstructive sleep apnea 08/04/2009  . External hemorrhoid 06/26/2009  . Osteoporosis 01/09/2009  . Hyperlipidemia 07/26/2007  . Depression with anxiety 07/26/2007  . Essential hypertension 07/26/2007  . Allergic rhinitis 07/26/2007  . Asthma 07/26/2007  . GERD 07/26/2007  . Osteoarthritis 07/26/2007  . Ocular migraine 07/26/2007  . Mild intermittent asthma without complication 27/02/5008    Past Surgical History:  Procedure Laterality Date  . ABDOMINAL AORTIC ANEURYSM REPAIR    . CARDIAC CATHETERIZATION  2001  . [place]  . CHOLECYSTECTOMY    . COLONOSCOPY    . ESOPHAGOGASTRODUODENOSCOPY (EGD) WITH PROPOFOL N/A 05/08/2020   Procedure: ESOPHAGOGASTRODUODENOSCOPY (EGD) WITH PROPOFOL;  Surgeon: Virgel Manifold, MD;  Location: [facility] ENDOSCOPY;  Service: Endoscopy;  Laterality: N/A;  . FRACTURE SURGERY Right    shoulder replacement  . HEMORRHOID BANDING    . ORIF DISTAL RADIUS FRACTURE Left   . UPPER GASTROINTESTINAL ENDOSCOPY      OB History    Gravida  2   Para      Term      Preterm      AB      Living        SAB      TAB      Ectopic      Multiple      Live Births               Home Medications    Prior to Admission medications   Medication Sig Start Date End Date Taking? Authorizing Provider  albuterol (PROVENTIL HFA;VENTOLIN HFA) 108 (90 Base) MCG/ACT inhaler Inhale 1-2 puffs into the lungs every 6  (six) hours as needed for wheezing or shortness of breath. 01/12/19  Yes Pleas Koch, NP  ALPRAZolam Duanne Moron) 0.5 MG tablet Take 1 tablet (0.5 mg total) by mouth daily as needed for anxiety. 02/19/20  Yes Tower, Wynelle Fanny, MD  aspirin EC 81 MG tablet Take 81 mg by mouth at bedtime.   Yes [provider]  atorvastatin (LIPITOR) 20 MG tablet TAKE 1 TABLET EVERY DAY 06/02/20  Yes Gollan, Kathlene November, MD  carvedilol (COREG) 6.25 MG tablet TAKE 1 TABLET TWICE DAILY WITH MEALS 04/02/20  Yes Gollan, Kathlene November, MD  Cholecalciferol (VITAMIN D3) 5000 units CAPS Take 1 capsule by mouth daily.   Yes [provider]  cyclobenzaprine (FLEXERIL) 10 MG tablet Take 0.5-1 tablets (5-10 mg total) by mouth 3 (three) times daily as needed. For headache 06/20/19  Yes Tower, Roque Lias A, MD  furosemide (LASIX) 20 MG tablet TAKE 1 TABLET (20 MG TOTAL) BY MOUTH DAILY. NEED MD APPOINTMENT 10/03/19  Yes Minna Merritts, MD  Javier Docker Oil (OMEGA-3) 500 MG CAPS Take 1 capsule by mouth daily.   Yes [provider]  lisinopril (ZESTRIL) 5 MG tablet TAKE 1/2 TABLET EVERY DAY 06/02/20  Yes Gollan, Kathlene November, MD  magnesium gluconate (MAGONATE) 500 MG tablet Take 500 mg by mouth at bedtime.   Yes [provider]  Misc Natural Products (TART CHERRY ADVANCED) CAPS Take 1 capsule by mouth daily.   Yes [provider]  naproxen sodium (ANAPROX) 220 MG tablet Take 440 mg by mouth 2 (two) times daily as needed (for headaches/pain).   Yes [provider]  nitroGLYCERIN (NITROSTAT) 0.4 MG SL tablet Place 1 tablet (0.4 mg total) under the tongue every 5 (five) minutes as needed for chest pain. 04/26/17  Yes Gollan, Kathlene November, MD  omeprazole (PRILOSEC) 40 MG capsule TAKE 1 CAPSULE EVERY DAY Patient taking differently: Take 40 mg by mouth in the morning and at bedtime.  01/30/20  Yes Tower, Wynelle Fanny, MD  potassium chloride (K-DUR) 10 MEQ tablet Take 1 tablet (10 mEq total) by mouth 2 (two) times daily as  needed. 09/10/19  Yes Gollan, Kathlene November, MD  promethazine (PHENERGAN) 25 MG tablet Take 0.5 tablets (12.5 mg total) by mouth every 8 (eight) hours as needed for nausea. 06/20/19  Yes Tower, Wynelle Fanny, MD  Simethicone (MYLANTA GAS PO) Take by mouth 3 times/day as needed-between meals & bedtime.   Yes [provider]  venlafaxine XR (EFFEXOR-XR) 150 MG 24 hr capsule TAKE 1 CAPSULE EVERY DAY WITH BREAKFAST 01/30/20  Yes Tower, Wynelle Fanny, MD  albuterol (PROVENTIL) (5 MG/ML) 0.5% nebulizer solution Take 0.5 mLs (2.5 mg total) by nebulization every 6 (six) hours as needed for wheezing or shortness of breath. 06/03/20   Enrique Sack, FNP  dextromethorphan-guaiFENesin (MUCINEX DM) 30-600 MG 12hr tablet Take 1 tablet by mouth 2 (two) times daily for 7 days. 06/03/20 06/10/20  Enrique Sack, FNP  levofloxacin (LEVAQUIN) 500 MG tablet Take 1 tablet (500 mg total) by mouth daily. 06/03/20   Enrique Sack, FNP  promethazine-dextromethorphan (PROMETHAZINE-DM) 6.25-15 MG/5ML syrup Take 5 mLs by mouth 4 (four) times daily as needed for cough. 06/03/20   Enrique Sack, FNP    Family History Family History  Problem Relation Age of Onset  . Hypertension Mother   . Depression Mother   . Osteoporosis Mother   . Stroke Mother   . Irritable bowel syndrome Mother   . Asthma Mother   . Heart disease Mother   . Thyroid disease Mother   . Anxiety disorder Mother   . Arthritis Sister   . Diabetes Sister   . Heart disease Sister   . Asthma Sister   . Migraines Sister   . Nephrolithiasis Sister   . Osteoporosis Sister   . Arthritis Sister   . Asthma Sister   . Migraines Sister   . Other Father 70  . Stroke Maternal Grandmother   . Colon cancer Maternal Grandmother   . Lymphoma Maternal Grandmother   . Migraines Maternal Grandmother   . Aneurysm Paternal Grandmother        brain  . Diabetes Paternal Grandmother   . Arthritis Paternal Grandmother   .  Arthritis Paternal Grandfather   . Diabetes  Paternal Grandfather   . Other Brother        prediabeties  . Asthma Brother   . Migraines Brother   . Lung cancer Maternal Grandfather   . Melanoma Maternal Grandfather     Social History Social History   Tobacco Use  . Smoking status: Former Smoker    Packs/day: 0.75    Years: 1.50    Pack years: 1.12    Types: Cigarettes    Quit date: 12/20/1978    Years since quitting: 41.4  . Smokeless tobacco: Never Used  Vaping Use  . Vaping Use: Never used  Substance Use Topics  . Alcohol use: Yes    Alcohol/week: 0.0 standard drinks  . Drug use: No     Allergies   Fosamax [alendronate sodium], Ginzing [ginseng], Hydrocod polst-cpm polst er, Hydrocodone-acetaminophen, Imitrex [sumatriptan], Oxycodone, Peanut-containing drug products, Tramadol, Hydrocodone-guaifenesin, Prednisone, and Tape   Review of Systems Review of Systems  Constitutional: Positive for fatigue. Negative for fever.  Respiratory: Positive for cough and shortness of breath.   Cardiovascular: Positive for chest pain. Negative for palpitations.  Gastrointestinal: Negative for nausea and vomiting.  All other systems reviewed and are negative.    Physical Exam Triage Vital Signs ED Triage Vitals  Enc Vitals Group     BP 06/03/20 1107 (!) 107/40     Pulse Rate 06/03/20 1107 85     Resp 06/03/20 1107 16     Temp 06/03/20 1107 98 F (36.7 C)     Temp Source 06/03/20 1107 Oral     SpO2 06/03/20 1107 98 %     Weight 06/03/20 1103 204 lb (92.5 kg)     Height 06/03/20 1103 5\' 5"  (1.651 m)     Head Circumference --      Peak Flow --      Pain Score 06/03/20 1103 1     Pain Loc --      Pain Edu? --      Excl. in [place]? --    No data found.  Updated Vital Signs BP (!) 107/40 (BP Location: Left Arm)   Pulse 85   Temp 98 F (36.7 C) (Oral)   Resp 16   Ht 5\' 5"  (1.651 m)   Wt 204 lb (92.5 kg)   SpO2 98%   BMI 33.95 kg/m   Visual Acuity Right Eye Distance:   Left Eye Distance:   Bilateral Distance:      Right Eye Near:   Left Eye Near:    Bilateral Near:     Physical Exam Vitals reviewed.  Constitutional:      General: She is not in acute distress.    Appearance: Normal appearance. She is not ill-appearing, toxic-appearing or diaphoretic.  HENT:     Head: Normocephalic.  Cardiovascular:     Rate and Rhythm: Normal rate and regular rhythm.     Pulses: Normal pulses.     Heart sounds: Normal heart sounds.  Pulmonary:     Effort: Pulmonary effort is normal.     Breath sounds: Normal breath sounds.  Musculoskeletal:        General: Normal range of motion.     Cervical back: Normal range of motion and neck supple.  Lymphadenopathy:     Cervical: No cervical adenopathy.  Skin:    General: Skin is warm and dry.  Neurological:     General: No focal deficit present.     Mental Status:  She is alert and oriented to person, place, and time.  Psychiatric:        Mood and Affect: Mood normal.        Behavior: Behavior normal.      UC Treatments / Results  Labs (all labs ordered are listed, but only abnormal results are displayed) Labs Reviewed - No data to display  EKG   Radiology DG Chest 2 View  Result Date: 06/03/2020 CLINICAL DATA:  Cough EXAM: CHEST - 2 VIEW COMPARISON:  Chest radiograph July 23, 2019 and CT angiogram chest August 17, 2019 FINDINGS: Lungs are clear. Heart size and pulmonary vascularity are normal. Patient is status post median sternotomy. There are foci of calcification in each carotid artery. No adenopathy. Total shoulder replacement on the right. IMPRESSION: Lungs clear. Cardiac silhouette normal. Foci of carotid artery calcification noted bilaterally. Electronically Signed   By: Lowella Grip III M.D.   On: 06/03/2020 12:15    Procedures Procedures (including critical care time)  Medications Ordered in UC Medications  methylPREDNISolone sodium succinate (SOLU-MEDROL) 40 mg/mL injection 80 mg (80 mg Intramuscular Given 06/03/20 1243)     Initial Impression / Assessment and Plan / UC Course  I have reviewed the triage vital signs and the nursing notes.  Pertinent labs & imaging results that were available during my care of the patient were reviewed by me and considered in my medical decision making (see chart for details).    64 yo female presenting with cough.  The cough is minimally productive.  Associated symptoms include fatigue, shortness of breath and chest pain during the cough.  He is completely vaccinated for COVID-19.  She is afebrile.  Nontoxic-appearing.  Lung sounds are clear.  Chest x-ray negative for anything acute.  Patient was treated with steroids and antibiotics by PCP without any relief in symptoms.  Will extend a 5-day course of antibiotics.  Solu-Medrol IM given in clinic.  Patient has allergy to prednisone but can tolerate this without difficulty and has had this in the past.  Prescribed antitussives as well.  Increase fluids.  Follow-up with PCP if no improvement of symptoms.  Go to the ED if worse.  Today's evaluation has revealed no signs of a dangerous process. Discussed diagnosis with patient and/or guardian. Patient and/or guardian aware of their diagnosis, possible red flag symptoms to watch out for and need for close follow up. Patient and/or guardian understands verbal and written discharge instructions. Patient and/or guardian comfortable with plan and disposition.  Patient and/or guardian has a clear mental status at this time, good insight into illness (after discussion and teaching) and has clear judgment to make decisions regarding their care  This care was provided during an unprecedented National Emergency due to the Novel Coronavirus (COVID-19) pandemic. COVID-19 infections and transmission risks place heavy strains on healthcare resources.  As this pandemic evolves, our facility, providers, and staff strive to respond fluidly, to remain operational, and to provide care relative to available  resources and information. Outcomes are unpredictable and treatments are without well-defined guidelines. Further, the impact of COVID-19 on all aspects of urgent care, including the impact to patients seeking care for reasons other than COVID-19, is unavoidable during this national emergency. At this time of the global pandemic, management of patients has significantly changed, even for non-COVID positive patients given high local and regional COVID volumes at this time requiring high healthcare system and resource utilization. The standard of care for management of both COVID suspected and non-COVID suspected  patients continues to change rapidly at the local, regional, national, and global levels. This patient was worked up and treated to the best available but ever changing evidence and resources available at this current time.   Documentation was completed with the aid of voice recognition software. Transcription may contain typographical errors  Final Clinical Impressions(s) / UC Diagnoses   Final diagnoses:  Acute bronchitis, unspecified organism     Discharge Instructions     Take medications as prescribed. Drink plenty of fluids. Follow-up with primary care provider if no improvement in symptoms. Go to ED immediately if you get worse.   Feel better soon,  Aldona Bar, [facility]     ED Prescriptions    Medication Sig Dispense Auth. Provider   levofloxacin (LEVAQUIN) 500 MG tablet Take 1 tablet (500 mg total) by mouth daily. 5 tablet Whittaker Lenis, [place], FNP   dextromethorphan-guaiFENesin [facility] DM) 30-600 MG 12hr tablet Take 1 tablet by mouth 2 (two) times daily for 7 days. 14 tablet Enrique Sack, FNP   promethazine-dextromethorphan (PROMETHAZINE-DM) 6.25-15 MG/5ML syrup Take 5 mLs by mouth 4 (four) times daily as needed for cough. 118 mL Enrique Sack, FNP   albuterol (PROVENTIL) (5 MG/ML) 0.5% nebulizer solution Take 0.5 mLs (2.5 mg total) by nebulization every 6 (six) hours as  needed for wheezing or shortness of breath. 20 mL Enrique Sack, FNP     PDMP not reviewed this encounter.   Enrique Sack, [place] 06/03/20 1247

## 2020-06-03 NOTE — Discharge Instructions (Signed)
Take medications as prescribed. Drink plenty of fluids. Follow-up with primary care provider if no improvement in symptoms. Go to ED immediately if you get worse.   Feel better soon,  Aldona Bar, FNP-C

## 2020-06-04 ENCOUNTER — Ambulatory Visit (INDEPENDENT_AMBULATORY_CARE_PROVIDER_SITE_OTHER): Payer: Medicare HMO | Admitting: Psychology

## 2020-06-04 DIAGNOSIS — F4323 Adjustment disorder with mixed anxiety and depressed mood: Secondary | ICD-10-CM | POA: Diagnosis not present

## 2020-06-05 ENCOUNTER — Ambulatory Visit (INDEPENDENT_AMBULATORY_CARE_PROVIDER_SITE_OTHER): Payer: TRICARE For Life (TFL) | Admitting: Bariatrics

## 2020-06-09 ENCOUNTER — Other Ambulatory Visit: Payer: Self-pay

## 2020-06-09 ENCOUNTER — Ambulatory Visit: Payer: Medicare HMO

## 2020-06-09 DIAGNOSIS — G8929 Other chronic pain: Secondary | ICD-10-CM | POA: Diagnosis not present

## 2020-06-09 DIAGNOSIS — M542 Cervicalgia: Secondary | ICD-10-CM | POA: Diagnosis not present

## 2020-06-09 DIAGNOSIS — M545 Low back pain, unspecified: Secondary | ICD-10-CM

## 2020-06-09 DIAGNOSIS — M546 Pain in thoracic spine: Secondary | ICD-10-CM

## 2020-06-09 DIAGNOSIS — R293 Abnormal posture: Secondary | ICD-10-CM | POA: Diagnosis not present

## 2020-06-09 NOTE — Therapy (Signed)
[place] MAIN [facility] SERVICES [address] [place], [place], [address] Phone: [phone]   Fax:  [phone]  Physical Therapy Treatment  Patient Details  Name: Rachel Torres MRN: [id number] Date of Birth: 10/29/1956 Referring Provider (PT): Glori Bickers, Wynelle Fanny MD   Encounter Date: 06/09/2020   PT End of Session - 06/09/20 1524    Visit Number 4    Number of Visits 16    Date for PT Re-Evaluation 07/09/20    Authorization Type 4/10 eval 05/14/20    PT Start Time 1431    PT Stop Time 1516    PT Time Calculation (min) 45 min    Activity Tolerance Patient tolerated treatment well;Patient limited by pain    Behavior During Therapy [facility] for tasks assessed/performed           Past Medical History:  Diagnosis Date  . AAA (abdominal aortic aneurysm) ([place])    a. Chronic w/o evidence of aneurysmal dil on CTA 01/2016.  Marland Kitchen Alcohol abuse   . Allergy   . Anemia   . Anxiety   . CAD (coronary artery disease)    a. 08/2010 s/p CABG x 1 (VG->RCA) @ time of Ao dissection repair; b. 01/2016 Lexiscan MV: mid antsept/apical defect w/ ? peri-infarct ischemia-->likely attenuation-->Med Rx.  . Chewing difficulty   . Chronic combined systolic (congestive) and diastolic (congestive) heart failure ([place])    a. 2012 EF 30-35%; b. 04/2015 EF 40-45%; c. 01/2016 Echo: Ef 55-65%, nl AoV bioprosthesis, nl RV, nl PASP.  Marland Kitchen Constipation   . Depression   . Edema of both lower extremities   . Gallbladder sludge   . GERD (gastroesophageal reflux disease)   . Heart failure ([place])   . Heart valve problem   . History of Bicuspid Aortic Valve    a. 08/2010 s/p AVR @ time of Ao dissection repair; b. 01/2016 Echo: Ef 55-65%, nl AoV bioprosthesis, nl RV, nl PASP.  Marland Kitchen Hx of repair of dissecting thoracic aortic aneurysm, [place] type A    a. 08/2010 s/p repair with AVR and VG->RCA; b. 01/2016 CTA: stable appearance of Asc Thoracic Aortic graft. Opacification of flase lumen of chronic abd  Ao dissection w/ retrograde flow through lumbar arteries. No aneurysmal dil of flase lumen.  . Hyperlipidemia   . Hypertensive heart disease   . IBS (irritable bowel syndrome)   . Internal hemorrhoids   . Kidney problem   . Marfan syndrome    pt denies  . Migraine   . Obstructive sleep apnea   . Osteoarthritis   . Osteoporosis   . Palpitations   . Pneumonia   . RA (rheumatoid arthritis) ([place])    a. Followed by Dr. [name]    Past Surgical History:  Procedure Laterality Date  . ABDOMINAL AORTIC ANEURYSM REPAIR    . CARDIAC CATHETERIZATION  2001  . [place]  . CHOLECYSTECTOMY    . COLONOSCOPY    . ESOPHAGOGASTRODUODENOSCOPY (EGD) WITH PROPOFOL N/A 05/08/2020   Procedure: ESOPHAGOGASTRODUODENOSCOPY (EGD) WITH PROPOFOL;  Surgeon: Virgel Manifold, MD;  Location: [facility] ENDOSCOPY;  Service: Endoscopy;  Laterality: N/A;  . FRACTURE SURGERY Right    shoulder replacement  . HEMORRHOID BANDING    . ORIF DISTAL RADIUS FRACTURE Left   . UPPER GASTROINTESTINAL ENDOSCOPY      There were no vitals filed for this visit.   Subjective Assessment - 06/09/20 1521    Subjective Patient has missed her last few  sessions due to having bronchitis. She is now better but has been having some migraines lately.    Pertinent History Patient has been seen by this therapist in the past for back related injuries/pain. Was last seen by this author in December of 2019. Since discharge patient has had COVID and an upper GI due to stomach issues. PMH includes aorta type A dissection (2011) with aortic valve replacement, bypass of R Coronary artery, hypertension, hyperlipidemia, CHF, pre-diabetes, AFIB, anxiety, depression, arthritis, GERD, atypical migraine, asthma, allergic rhinitis. 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 Does now have help with aide.    Limitations Sitting;Reading;Lifting;Standing;Walking;Writing;Other (comment);House hold activities    How long can you sit  comfortably? 20 mins before whole body discomfort    How long can you stand comfortably? 5 mins  or less: lower back    How long can you walk comfortably? 2 minutes    Patient Stated Goals to decrease pain and migraines    Currently in Pain? Yes    Pain Score 4     Pain Location --   head and neck region   Pain Orientation Posterior    Pain Descriptors / Indicators Headache;Radiating    Pain Type Neuropathic pain;Chronic pain    Pain Onset More than a month ago    Pain Frequency Intermittent                       Prone:   Thoracic mobilizations T1-T11 CPAs and UPAs  Grade I-III CPA and UPAs c2-T1 hypomobile throughout with pain relief upon L C4 ; 4x10 seconds each level Thoracic J sweep against thoracic curvature mobilizations grade I-III 5x10 seconds      Supine: Sidebending with overpressure stretch 4x 20 seconds with R more tight than L Rotation l and R with overpressure stretch 4x20 seconds with R more tight than L.   ULT median nerve glide supine 10x RUE; LUE ULT radial nerve glide supine 10x RUE ; LUE    Suboccipital release 4x 20 seconds    Trigger point/soft tissue release with movement to improve soft tissue limited muscle tissue length. 5 minutes   Seated scapular retractions 20x   seated . STM with focus on levator scap, upper trap, cervical paraspinals, multiple trigger points requiring release x 14 minutes           Pt. response to medical necessity:  Patient will continue to benefit from skilled physical therapy to reduce pain, improve cervical mobility, and allow patient better QOL                  PT Education - 06/09/20 1523    Education provided Yes    Education Details manual, body mechanics    Person(s) Educated Patient    Methods Explanation;Demonstration;Tactile cues;Verbal cues    Comprehension Verbalized understanding;Returned demonstration;Verbal cues required;Tactile cues required            PT Short Term Goals  - 05/14/20 1617      PT SHORT TERM GOAL #1   Title Patient will be independent in home exercise program to improve strength/mobility and decrease pain for better functional independence with ADLs.    Baseline 5/26: HEP next session    Time 2    Period Weeks    Status New    Target Date 05/28/20      PT SHORT TERM GOAL #2   Title Patient will report a worst pain of 7/10 on VAS in head  to improve tolerance with ADLs and reduced symptoms with activities.    Baseline 5/26: 9/10    Time 2    Period Weeks    Status New    Target Date 05/28/20             PT Long Term Goals - 05/14/20 1623      PT LONG TERM GOAL #1   Title Patient will report a worst pain of 3/10 on VAS in her head/migraines to improve tolerance with ADLs and reduced symptoms with activities    Baseline 5/26: 9/10    Time 8    Period Weeks    Status New    Target Date 07/09/20      PT LONG TERM GOAL #2   Title Patient will increase FOTO score to equal to or greater than   45/100  to demonstrate statistically significant improvement in mobility and quality of life.    Baseline 5/26: 44 with risk adjusted 33/100    Time 8    Period Weeks    Status New    Target Date 07/09/20      PT LONG TERM GOAL #3   Title Patient will reduce dizziness handicap inventory score to <30, for less dizziness with ADLs and increased safety with home and work tasks.    Baseline 5/26: 40    Time 8    Period Weeks    Status New    Target Date 07/09/20      PT LONG TERM GOAL #4   Title Patient will improve cervical rotation ROM to >/=60 degrees bilaterally to improve functional mobility and demonstrate improved muscle tissue length    Baseline 5/26: R 46 L 42    Time 8    Period Weeks    Status New    Target Date 07/09/20                 Plan - 06/09/20 1529    Clinical Impression Statement Patient presents with heavy guarding of cervical and upper thoracic region with high tension and limited mobility. Extensive  soft tissue and manual allow for reduction of symptoms and pain with decreased radiating pain from upper trap/cervical region to head. Patient will benefit from skilled physical therapy to decrease pain, improve posture, and improve quality of life.    Personal Factors and Comorbidities Age;Comorbidity 3+;Fitness;Finances;Past/Current Experience;Social Background;Time since onset of injury/illness/exacerbation;Other    Comorbidities aorta type A dissection (2011) with aortic valve replacement, bypass of R Coronary artery, hypertension, hyperlipidemia, CHF, pre-diabetes, AFIB, anxiety, depression, arthritis, GERD, atypical migraine, asthma, allergic rhinitis.    Examination-Activity Limitations [place];Caring for Others;Carry;Dressing;Stairs;Squat;Sit;Reach Overhead;Locomotion Level;Lift;Stand;Toileting;Transfers    Examination-Participation Restrictions Church;Cleaning;Community Activity;Interpersonal Relationship;Driving;Laundry;Volunteer;Shop;Personal Finances;Meal Prep;Medication Management;Yard Work    Product manager    Rehab Potential Fair    Clinical Impairments Affecting Rehab Potential + CAD testing, history of AAA, anemia, anxiety, asthma, CAD, chronic combined ystolic heart failure, GERD, Marfan syndrome, OA, RA (+) good work Psychologist, forensic, motivated, age     PT Frequency 2x / week    PT Duration 8 weeks    PT Treatment/Interventions ADLs/Self Care Home Management;Aquatic Therapy;Vestibular;Vasopneumatic Device;Taping;Passive range of motion;Dry needling;Energy conservation;Manual techniques;Therapeutic exercise;Balance training;Neuromuscular re-education;Patient/family education;Therapeutic activities;Functional mobility training;Stair training;Gait training;Electrical Stimulation;Iontophoresis 4mg /ml Dexamethasone;Moist Heat;Traction;Ultrasound;Cryotherapy;Canalith Repostioning;Joint Manipulations;Spinal Manipulations    PT Next Visit Plan cervical muscle  tissue lengthening, give HEP,    PT Home Exercise Plan Educated in seated Lev Scap and U/T stretches for HEP, as well as scap pinches and  chin tucks.    Consulted and Agree with Plan of Care Patient           Patient will benefit from skilled therapeutic intervention in order to improve the following deficits and impairments:  Cardiopulmonary status limiting activity, Decreased activity tolerance, Decreased endurance, Decreased mobility, Decreased range of motion, Decreased strength, Difficulty walking, Dizziness, Hypomobility, Impaired flexibility, Impaired perceived functional ability, Increased muscle spasms, Impaired UE functional use, Impaired vision/preception, Obesity, Postural dysfunction, Improper body mechanics, Pain  Visit Diagnosis: Cervicalgia  Abnormal posture  Pain in thoracic spine  Chronic midline low back pain, unspecified whether sciatica present     Problem List Patient Active Problem List   Diagnosis Date Noted  . Acute bronchitis with bronchospasm 05/28/2020  . Diarrhea 04/11/2020  . Epigastric pain 02/19/2020  . Dark stools 02/19/2020  . PAF (paroxysmal atrial fibrillation) ([place]) 02/05/2019  . HPV (human papilloma virus) infection 12/01/2016  . Screening mammogram, encounter for 11/29/2016  . Estrogen deficiency 11/29/2016  . Encounter for routine gynecological examination 11/29/2016  . Grade III hemorrhoids 10/06/2016  . Tachycardia 08/14/2016  . Hemorrhoids 08/13/2016  . Atypical migraine 08/03/2016  . CAD (coronary artery disease)   . AAA (abdominal aortic aneurysm) ([place])   . Chronic combined systolic (congestive) and diastolic (congestive) heart failure ([place])   . Prediabetes 02/11/2016  . Generalized anxiety disorder 12/08/2015  . Panic attacks 07/28/2015  . Sensorineural hearing loss 03/27/2013  . Vitamin D deficiency 07/03/2012  . H/O aortic dissection 05/12/2012  . Class 2 obesity due to excess calories with body mass index (BMI) of 35.0 to  35.9 in adult 11/21/2011  . Obesity (BMI 30.0-34.9) 11/21/2011  . Routine general medical examination at a health care facility 09/20/2011  . S/P AVR (aortic valve replacement) 01/07/2011  . CORONARY ARTERY BYPASS GRAFT, HX OF 01/07/2011  . Arthritis 12/20/2010  . Back pain 12/15/2010  . H/O dissecting abdominal aortic aneurysm repair 08/24/2010  . IRRITABLE BOWEL SYNDROME 09/09/2009  . Obstructive sleep apnea 08/04/2009  . External hemorrhoid 06/26/2009  . Osteoporosis 01/09/2009  . Hyperlipidemia 07/26/2007  . Depression with anxiety 07/26/2007  . Essential hypertension 07/26/2007  . Allergic rhinitis 07/26/2007  . Asthma 07/26/2007  . GERD 07/26/2007  . Osteoarthritis 07/26/2007  . Ocular migraine 07/26/2007  . Mild intermittent asthma without complication 24/58/0998   Janna Arch, PT, DPT   06/09/2020, 3:31 PM  [place] MAIN [facility] SERVICES [address] [place], [place], [address] Phone: [phone]   Fax:  [phone]  Name: Rachel Torres MRN: [id number] Date of Birth: Sep 04, 1956

## 2020-06-13 ENCOUNTER — Ambulatory Visit: Payer: Medicare HMO

## 2020-06-13 ENCOUNTER — Other Ambulatory Visit: Payer: Self-pay

## 2020-06-13 DIAGNOSIS — R293 Abnormal posture: Secondary | ICD-10-CM | POA: Diagnosis not present

## 2020-06-13 DIAGNOSIS — M545 Low back pain: Secondary | ICD-10-CM | POA: Diagnosis not present

## 2020-06-13 DIAGNOSIS — M542 Cervicalgia: Secondary | ICD-10-CM

## 2020-06-13 DIAGNOSIS — G8929 Other chronic pain: Secondary | ICD-10-CM | POA: Diagnosis not present

## 2020-06-13 DIAGNOSIS — M546 Pain in thoracic spine: Secondary | ICD-10-CM

## 2020-06-13 NOTE — Therapy (Signed)
[place] [facility] [facility] SERVICES [place], [place], [address] Phone: [phone]   Fax:  [phone]  Physical Therapy Treatment  Patient Details  Name: Rachel Torres MRN: [id number] Date of Birth: 05-29-1956 Referring Provider (PT): Glori Bickers, Wynelle Fanny MD   Encounter Date: 06/13/2020   PT End of Session - 06/13/20 0850    Visit Number 5    Number of Visits 16    Date for PT Re-Evaluation 07/09/20    Authorization Type 5/10 eval 05/14/20    PT Start Time 0858    PT Stop Time 0943    PT Time Calculation (min) 45 min    Activity Tolerance Patient tolerated treatment well;Patient limited by pain    Behavior During Therapy [facility] for tasks assessed/performed           Past Medical History:  Diagnosis Date  . AAA (abdominal aortic aneurysm) ([place])    a. Chronic w/o evidence of aneurysmal dil on CTA 01/2016.  Marland Kitchen Alcohol abuse   . Allergy   . Anemia   . Anxiety   . CAD (coronary artery disease)    a. 08/2010 s/p CABG x 1 (VG->RCA) @ time of Ao dissection repair; b. 01/2016 Lexiscan MV: mid antsept/apical defect w/ ? peri-infarct ischemia-->likely attenuation-->Med Rx.  . Chewing difficulty   . Chronic combined systolic (congestive) and diastolic (congestive) heart failure ([place])    a. 2012 EF 30-35%; b. 04/2015 EF 40-45%; c. 01/2016 Echo: Ef 55-65%, nl AoV bioprosthesis, nl RV, nl PASP.  Marland Kitchen Constipation   . Depression   . Edema of both lower extremities   . Gallbladder sludge   . GERD (gastroesophageal reflux disease)   . Heart failure ([place])   . Heart valve problem   . History of Bicuspid Aortic Valve    a. 08/2010 s/p AVR @ time of Ao dissection repair; b. 01/2016 Echo: Ef 55-65%, nl AoV bioprosthesis, nl RV, nl PASP.  Marland Kitchen Hx of repair of dissecting thoracic aortic aneurysm, [place] type A    a. 08/2010 s/p repair with AVR and VG->RCA; b. 01/2016 CTA: stable appearance of Asc Thoracic Aortic graft. Opacification of flase lumen of chronic abd  Ao dissection w/ retrograde flow through lumbar arteries. No aneurysmal dil of flase lumen.  . Hyperlipidemia   . Hypertensive heart disease   . IBS (irritable bowel syndrome)   . Internal hemorrhoids   . Kidney problem   . Marfan syndrome    pt denies  . Migraine   . Obstructive sleep apnea   . Osteoarthritis   . Osteoporosis   . Palpitations   . Pneumonia   . RA (rheumatoid arthritis) ([place])    a. Followed by Dr. [name]    Past Surgical History:  Procedure Laterality Date  . ABDOMINAL AORTIC ANEURYSM REPAIR    . CARDIAC CATHETERIZATION  2001  . [place]  . CHOLECYSTECTOMY    . COLONOSCOPY    . ESOPHAGOGASTRODUODENOSCOPY (EGD) WITH PROPOFOL N/A 05/08/2020   Procedure: ESOPHAGOGASTRODUODENOSCOPY (EGD) WITH PROPOFOL;  Surgeon: Virgel Manifold, MD;  Location: [facility] ENDOSCOPY;  Service: Endoscopy;  Laterality: N/A;  . FRACTURE SURGERY Right    shoulder replacement  . HEMORRHOID BANDING    . ORIF DISTAL RADIUS FRACTURE Left   . UPPER GASTROINTESTINAL ENDOSCOPY      There were no vitals filed for this visit.   Subjective Assessment - 06/13/20 0942    Subjective Patient reports she had a migraine  last night. Her caregiver duties have increased and she is fatigued/stressed.    Pertinent History Patient has been seen by this therapist in the past for back related injuries/pain. Was last seen by this author in December of 2019. Since discharge patient has had COVID and an upper GI due to stomach issues. PMH includes aorta type A dissection (2011) with aortic valve replacement, bypass of R Coronary artery, hypertension, hyperlipidemia, CHF, pre-diabetes, AFIB, anxiety, depression, arthritis, GERD, atypical migraine, asthma, allergic rhinitis. 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 Does now have help with aide.    Limitations Sitting;Reading;Lifting;Standing;Walking;Writing;Other (comment);House hold activities    How long can you sit comfortably? 20 mins  before whole body discomfort    How long can you stand comfortably? 5 mins  or less: lower back    How long can you walk comfortably? 2 minutes    Patient Stated Goals to decrease pain and migraines    Currently in Pain? Yes    Pain Score 4     Pain Location Head    Pain Orientation Posterior    Pain Descriptors / Indicators Headache    Pain Type Neuropathic pain    Pain Onset More than a month ago    Pain Frequency Intermittent              Prone:   Thoracic mobilizations T1-T11 CPAs and UPAs  Grade I-III CPA and UPAs c2-T1 hypomobile throughout with pain relief upon L C4 ; 4x10 seconds each level Thoracic J sweep against thoracic curvature mobilizations grade I-III 5x10 seconds      Supine: Sidebending with overpressure stretch 4x 20 seconds with R more tight than L Rotation l and R with overpressure stretch 4x20 seconds with R more tight than L.   ULT median nerve glide supine 10x RUE; LUE ULT radial nerve glide supine 10x RUE ; LUE    Suboccipital release 4x 20 seconds    Seated:  Trigger point/soft tissue release with movement to improve soft tissue limited muscle tissue length. 8 minutes   Seated scapular retractions 20x    STM with focus on levator scap, upper trap, cervical paraspinals, multiple trigger points requiring release x 14 minutes    Seated trunk extension with arms crossed with towel roll x 10    Pt. response to medical necessity:  Patient will continue to benefit from skilled physical therapy to reduce pain, improve cervical mobility, and allow patient better QOL     Patient presents with muscle tension and guarding. Pain in head is able to be reproduced with trigger point activation. Reduction of symptoms by end of session through combination of manual and therex. Postural corrections continue to be required for proper muscle length and bony alignment. Patient will benefit from skilled physical therapy to decrease pain, improve posture, and improve  quality of life.                     PT Education - 06/13/20 0849    Education provided Yes    Education Details exercise technique, manual    Person(s) Educated Patient    Methods Explanation;Demonstration;Tactile cues;Verbal cues    Comprehension Verbalized understanding;Returned demonstration;Verbal cues required;Tactile cues required            PT Short Term Goals - 05/14/20 1617      PT SHORT TERM GOAL #1   Title Patient will be independent in home exercise program to improve strength/mobility and decrease pain for better functional  independence with ADLs.    Baseline 5/26: HEP next session    Time 2    Period Weeks    Status New    Target Date 05/28/20      PT SHORT TERM GOAL #2   Title Patient will report a worst pain of 7/10 on VAS in head to improve tolerance with ADLs and reduced symptoms with activities.    Baseline 5/26: 9/10    Time 2    Period Weeks    Status New    Target Date 05/28/20             PT Long Term Goals - 05/14/20 1623      PT LONG TERM GOAL #1   Title Patient will report a worst pain of 3/10 on VAS in her head/migraines to improve tolerance with ADLs and reduced symptoms with activities    Baseline 5/26: 9/10    Time 8    Period Weeks    Status New    Target Date 07/09/20      PT LONG TERM GOAL #2   Title Patient will increase FOTO score to equal to or greater than   45/100  to demonstrate statistically significant improvement in mobility and quality of life.    Baseline 5/26: 44 with risk adjusted 33/100    Time 8    Period Weeks    Status New    Target Date 07/09/20      PT LONG TERM GOAL #3   Title Patient will reduce dizziness handicap inventory score to <30, for less dizziness with ADLs and increased safety with home and work tasks.    Baseline 5/26: 40    Time 8    Period Weeks    Status New    Target Date 07/09/20      PT LONG TERM GOAL #4   Title Patient will improve cervical rotation ROM to >/=60  degrees bilaterally to improve functional mobility and demonstrate improved muscle tissue length    Baseline 5/26: R 46 L 42    Time 8    Period Weeks    Status New    Target Date 07/09/20                 Plan - 06/13/20 0945    Clinical Impression Statement Patient presents with muscle tension and guarding. Pain in head is able to be reproduced with trigger point activation. Reduction of symptoms by end of session through combination of manual and therex. Postural corrections continue to be required for proper muscle length and bony alignment. Patient will benefit from skilled physical therapy to decrease pain, improve posture, and improve quality of life.    Personal Factors and Comorbidities Age;Comorbidity 3+;Fitness;Finances;Past/Current Experience;Social Background;Time since onset of injury/illness/exacerbation;Other    Comorbidities aorta type A dissection (2011) with aortic valve replacement, bypass of R Coronary artery, hypertension, hyperlipidemia, CHF, pre-diabetes, AFIB, anxiety, depression, arthritis, GERD, atypical migraine, asthma, allergic rhinitis.    Examination-Activity Limitations [place];Caring for Others;Carry;Dressing;Stairs;Squat;Sit;Reach Overhead;Locomotion Level;Lift;Stand;Toileting;Transfers    Examination-Participation Restrictions Church;Cleaning;Community Activity;Interpersonal Relationship;Driving;Laundry;Volunteer;Shop;Personal Finances;Meal Prep;Medication Management;Yard Work    Product manager    Rehab Potential Fair    Clinical Impairments Affecting Rehab Potential + CAD testing, history of AAA, anemia, anxiety, asthma, CAD, chronic combined ystolic heart failure, GERD, Marfan syndrome, OA, RA (+) good work Psychologist, forensic, motivated, age     PT Frequency 2x / week    PT Duration 8 weeks    PT Treatment/Interventions ADLs/Self Care  Home Management;Aquatic Therapy;Vestibular;Vasopneumatic Device;Taping;Passive range of  motion;Dry needling;Energy conservation;Manual techniques;Therapeutic exercise;Balance training;Neuromuscular re-education;Patient/family education;Therapeutic activities;Functional mobility training;Stair training;Gait training;Electrical Stimulation;Iontophoresis 4mg /ml Dexamethasone;Moist Heat;Traction;Ultrasound;Cryotherapy;Canalith Repostioning;Joint Manipulations;Spinal Manipulations    PT Next Visit Plan cervical muscle tissue lengthening, give HEP,    PT Home Exercise Plan Educated in seated Lev Scap and U/T stretches for HEP, as well as scap pinches and chin tucks.    Consulted and Agree with Plan of Care Patient           Patient will benefit from skilled therapeutic intervention in order to improve the following deficits and impairments:  Cardiopulmonary status limiting activity, Decreased activity tolerance, Decreased endurance, Decreased mobility, Decreased range of motion, Decreased strength, Difficulty walking, Dizziness, Hypomobility, Impaired flexibility, Impaired perceived functional ability, Increased muscle spasms, Impaired UE functional use, Impaired vision/preception, Obesity, Postural dysfunction, Improper body mechanics, Pain  Visit Diagnosis: Cervicalgia  Abnormal posture  Pain in thoracic spine     Problem List Patient Active Problem List   Diagnosis Date Noted  . Acute bronchitis with bronchospasm 05/28/2020  . Diarrhea 04/11/2020  . Epigastric pain 02/19/2020  . Dark stools 02/19/2020  . PAF (paroxysmal atrial fibrillation) ([place]) 02/05/2019  . HPV (human papilloma virus) infection 12/01/2016  . Screening mammogram, encounter for 11/29/2016  . Estrogen deficiency 11/29/2016  . Encounter for routine gynecological examination 11/29/2016  . Grade III hemorrhoids 10/06/2016  . Tachycardia 08/14/2016  . Hemorrhoids 08/13/2016  . Atypical migraine 08/03/2016  . CAD (coronary artery disease)   . AAA (abdominal aortic aneurysm) ([place])   . Chronic combined  systolic (congestive) and diastolic (congestive) heart failure ([place])   . Prediabetes 02/11/2016  . Generalized anxiety disorder 12/08/2015  . Panic attacks 07/28/2015  . Sensorineural hearing loss 03/27/2013  . Vitamin D deficiency 07/03/2012  . H/O aortic dissection 05/12/2012  . Class 2 obesity due to excess calories with body mass index (BMI) of 35.0 to 35.9 in adult 11/21/2011  . Obesity (BMI 30.0-34.9) 11/21/2011  . Routine general medical examination at a health care facility 09/20/2011  . S/P AVR (aortic valve replacement) 01/07/2011  . CORONARY ARTERY BYPASS GRAFT, HX OF 01/07/2011  . Arthritis 12/20/2010  . Back pain 12/15/2010  . H/O dissecting abdominal aortic aneurysm repair 08/24/2010  . IRRITABLE BOWEL SYNDROME 09/09/2009  . Obstructive sleep apnea 08/04/2009  . External hemorrhoid 06/26/2009  . Osteoporosis 01/09/2009  . Hyperlipidemia 07/26/2007  . Depression with anxiety 07/26/2007  . Essential hypertension 07/26/2007  . Allergic rhinitis 07/26/2007  . Asthma 07/26/2007  . GERD 07/26/2007  . Osteoarthritis 07/26/2007  . Ocular migraine 07/26/2007  . Mild intermittent asthma without complication 62/94/7654   Janna Arch, PT, DPT   06/13/2020, 9:46 AM  [place] [facility] [facility] SERVICES [address] [place], [place], [address] Phone: [phone]   Fax:  [phone]  Name: Rachel Torres MRN: [id number] Date of Birth: 11/02/1956

## 2020-06-15 ENCOUNTER — Other Ambulatory Visit: Payer: Self-pay | Admitting: Cardiovascular Disease

## 2020-06-17 ENCOUNTER — Ambulatory Visit: Payer: Medicare HMO

## 2020-06-17 ENCOUNTER — Other Ambulatory Visit: Payer: Self-pay

## 2020-06-17 DIAGNOSIS — M546 Pain in thoracic spine: Secondary | ICD-10-CM

## 2020-06-17 DIAGNOSIS — M542 Cervicalgia: Secondary | ICD-10-CM | POA: Diagnosis not present

## 2020-06-17 DIAGNOSIS — G8929 Other chronic pain: Secondary | ICD-10-CM | POA: Diagnosis not present

## 2020-06-17 DIAGNOSIS — M545 Low back pain, unspecified: Secondary | ICD-10-CM

## 2020-06-17 DIAGNOSIS — R293 Abnormal posture: Secondary | ICD-10-CM

## 2020-06-17 NOTE — Therapy (Signed)
[place] MAIN [facility] SERVICES [address] [place], [place], [address] Phone: [phone]   Fax:  [phone]  Physical Therapy Treatment/Re-eval (adding additional POC)  Patient Details  Name: Rachel Torres MRN: [id number] Date of Birth: [birth year] Referring Provider (PT): Glori Bickers, Wynelle Fanny MD   Encounter Date: 06/17/2020   PT End of Session - 06/17/20 1324    Visit Number 6    Number of Visits 16    Date for PT Re-Evaluation 07/09/20    Authorization Type 6/10 eval 05/14/20    Authorization Time Period back pain added to POC on 06/17/20    PT Start Time 1100    PT Stop Time 1146    PT Time Calculation (min) 46 min    Activity Tolerance Patient tolerated treatment well;Patient limited by pain    Behavior During Therapy [facility] for tasks assessed/performed           Past Medical History:  Diagnosis Date  . AAA (abdominal aortic aneurysm) ([place])    a. Chronic w/o evidence of aneurysmal dil on CTA 01/2016.  Marland Kitchen Alcohol abuse   . Allergy   . Anemia   . Anxiety   . CAD (coronary artery disease)    a. 08/2010 s/p CABG x 1 (VG->RCA) @ time of Ao dissection repair; b. 01/2016 Lexiscan MV: mid antsept/apical defect w/ ? peri-infarct ischemia-->likely attenuation-->Med Rx.  . Chewing difficulty   . Chronic combined systolic (congestive) and diastolic (congestive) heart failure ([place])    a. 2012 EF 30-35%; b. 04/2015 EF 40-45%; c. 01/2016 Echo: Ef 55-65%, nl AoV bioprosthesis, nl RV, nl PASP.  Marland Kitchen Constipation   . Depression   . Edema of both lower extremities   . Gallbladder sludge   . GERD (gastroesophageal reflux disease)   . Heart failure ([place])   . Heart valve problem   . History of Bicuspid Aortic Valve    a. 08/2010 s/p AVR @ time of Ao dissection repair; b. 01/2016 Echo: Ef 55-65%, nl AoV bioprosthesis, nl RV, nl PASP.  Marland Kitchen Hx of repair of dissecting thoracic aortic aneurysm, [place] type A    a. 08/2010 s/p repair with AVR and VG->RCA; b. 01/2016  CTA: stable appearance of Asc Thoracic Aortic graft. Opacification of flase lumen of chronic abd Ao dissection w/ retrograde flow through lumbar arteries. No aneurysmal dil of flase lumen.  . Hyperlipidemia   . Hypertensive heart disease   . IBS (irritable bowel syndrome)   . Internal hemorrhoids   . Kidney problem   . Marfan syndrome    pt denies  . Migraine   . Obstructive sleep apnea   . Osteoarthritis   . Osteoporosis   . Palpitations   . Pneumonia   . RA (rheumatoid arthritis) ([place])    a. Followed by Dr. [name]    Past Surgical History:  Procedure Laterality Date  . ABDOMINAL AORTIC ANEURYSM REPAIR    . CARDIAC CATHETERIZATION  2001  . [place]  . CHOLECYSTECTOMY    . COLONOSCOPY    . ESOPHAGOGASTRODUODENOSCOPY (EGD) WITH PROPOFOL N/A 05/08/2020   Procedure: ESOPHAGOGASTRODUODENOSCOPY (EGD) WITH PROPOFOL;  Surgeon: Virgel Manifold, MD;  Location: [facility] ENDOSCOPY;  Service: Endoscopy;  Laterality: N/A;  . FRACTURE SURGERY Right    shoulder replacement  . HEMORRHOID BANDING    . ORIF DISTAL RADIUS FRACTURE Left   . UPPER GASTROINTESTINAL ENDOSCOPY      There were no vitals filed for this visit.  Subjective Assessment - 06/17/20 1205    Subjective Patient is ready to add her spinal order to POC. Has been having spinal pain from caregiver duties.    Pertinent History Patient has been seen by this therapist in the past for back related injuries/pain. Was last seen by this author in December of 2019. Since discharge patient has had COVID and an upper GI due to stomach issues. PMH includes aorta type A dissection (2011) with aortic valve replacement, bypass of R Coronary artery, hypertension, hyperlipidemia, CHF, pre-diabetes, AFIB, anxiety, depression, arthritis, GERD, atypical migraine, asthma, allergic rhinitis. 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 Does now have help with aide.    Limitations  Sitting;Reading;Lifting;Standing;Walking;Writing;Other (comment);House hold activities    How long can you sit comfortably? 20 mins before whole body discomfort    How long can you stand comfortably? 5 mins  or less: lower back    How long can you walk comfortably? 2 minutes    Patient Stated Goals to decrease pain and migraines    Currently in Pain? Yes    Pain Score --   3/10 thoracic , 2/10 lumbar and cervical   Pain Location Back    Pain Orientation Upper;Mid;Lower    Pain Descriptors / Indicators Aching;Throbbing;Tightness    Pain Type Chronic pain;Neuropathic pain    Pain Onset More than a month ago    Pain Frequency Intermittent    Aggravating Factors  tension, stress, prolonged standing    Pain Relieving Factors pain medication, sitting/laying down    Effect of Pain on Daily Activities limited mobility              [facility] PT Assessment - 06/17/20 0001      Assessment   Medical Diagnosis back pain and atypical migraine    Referring Provider (PT) Tower, Wynelle Fanny MD    Onset Date/Surgical Date --   2020 for migraines; Dec 14, 1980 for back pain    Hand Dominance Right    Prior Therapy yes       Precautions   Precautions Other (comment)   heart surgery, bypass     Restrictions   Weight Bearing Restrictions No      Balance Screen   Has the patient fallen in the past 6 months No    Has the patient had a decrease in activity level because of a fear of falling?  Yes    Is the patient reluctant to leave their home because of a fear of falling?  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  Standing: lumbar/lower thoracic hinge , thoracic kyphosis.   PROM/AROM:  AROM BUE:WFL bilaterally AROM BLE: WFL bilaterally  Muscle tissue length limitations:  Pectoral: 11 on left ;10 on the right  Quadratus: limited bilaterally Paraspinals: limited bilaterally   Accessory Motions: SI: grade II-III, hypomobile UPAs (inferior glide), hypomobile CPA (inferior glide)- pain relieving Lumbar: tender and hypomobile with muscle guarding CPAs and UPAs Thoracic:tender and hypomobile with muscle guarding CPAs and UPAs   Active  Trunk Flexion Thoracic: WFL Lumbar: 10% deficit  Trunk Extension Thoracic : hinging at lumbar spine, little to no active extension Lumbar: hinge at L1-2, ~10* deficit  Trunk R SB WFL  Trunk L SB WFL  Trunk R rotation Hips blocked: * limited 15%  Trunk L rotation Hips blocked * limited 15%   * pain   STRENGTH:  Graded on a 0-5 scale Muscle Group Left Right  Hip Flex 4/5 4/5  Hip Abd 4-/5 4-/5  Hip Add 4-/5 4-/5  Hip Ext 3+/5 3+/5  Hip IR/ER 3+/5 3+/5  Knee Flex 4/5 4/5  Knee Ext 4-/5 4-/5  Ankle DF 4-/5 4-/5  Ankle PF 4-/5 4-/5   SENSATION:  BUE : slight deficit of LUE (long standing issue from accident in her 32s) BLE : WFL   NEUROLOGICAL SCREEN: (2+ unless otherwise noted.) N=normal  Ab=abnormal   Level Dermatome R L  C3 Anterior Neck  N N  C4 Top of Shoulder N N  C5 Lateral Upper Arm  N N  C6  Lateral Arm/ Thumb  N N  C7 Middle Finger  N N  C8 4th & 5th Finger N N  T1 Medial Arm N N  L2 Medial thigh/groin N N  L3 Lower thigh/med.knee N N  L4 Medial leg/lat thigh N N  L5 Lat. leg & dorsal foot N N  S1 post/lat foot/thigh/leg N N  S2 Post./med. thigh & leg N N        COORDINATION: Finger to Nose:WFL Heel shin slide test: WFL.  SPECIAL TESTS:  Slump test: negative bilaterally  SLR: +LLE, -RLE Pelvic distraction : pain relieving Pelvic compression: negative SI thrust: negative: (pain relieving)  FABER negative bilaterally FAIR negative bilaterally  FUNCTIONAL MOBILITY: STS: hands on knees, utilize momentum rather than LE activation Sit to supine: slow movements, mod I Supine to prone; slow painful movements, mod I    Squat to pick up cone: limited mobility with excessive compensatory patterning  GAIT: Bilateral external rotation with little/no trunk rotation and arm swing  OUTCOME MEASURES: TEST Outcome Interpretation  5 times sit<>stand 15.83 sec >60 yo, >15 sec indicates increased risk for falls  MODI  46%                           Access Code: [email or web address] URL: [email or web address] Date: 06/17/2020 Prepared by: Janna Arch  Exercises Supine Lower Trunk Rotation - 1 x daily - 7 x weekly - 2 sets - 10 reps - 5 hold Supine Posterior Pelvic Tilt - 1 x daily - 7 x weekly - 2 sets - 10 reps - 5 hold Supine Transversus Abdominis Bracing - Hands on Stomach - 1 x daily - 7 x weekly - 2 sets - 10 reps - 5 hold Seated Transversus Abdominis Bracing - 1 x daily - 7 x weekly - 2 sets - 10 reps -  5 hold Seated Thoracic Lumbar Extension - 1 x daily - 7 x weekly - 2 sets - 10 reps - 5 hold            PT Education - 06/17/20 1207    Education provided Yes    Education Details goals, POC , HEP    Person(s) Educated Patient    Methods Explanation;Demonstration;Tactile cues;Verbal cues;Handout    Comprehension Need further  instruction;Verbalized understanding;Returned demonstration;Verbal cues required;Tactile cues required            PT Short Term Goals - 06/17/20 1328      PT SHORT TERM GOAL #1   Title Patient will be independent in home exercise program to improve strength/mobility and decrease pain for better functional independence with ADLs.    Baseline 5/26: HEP next session    Time 2    Period Weeks    Status New    Target Date 05/28/20      PT SHORT TERM GOAL #2   Title Patient will report a worst pain of 7/10 on VAS in head to improve tolerance with ADLs and reduced symptoms with activities.    Baseline 5/26: 9/10    Time 2    Period Weeks    Status New    Target Date 05/28/20      PT SHORT TERM GOAL #3   Title Patient will report a worst pain of 6/10 on VAS in thoracic and lumbar spine to improve tolerance with ADLs and reduced symptoms with activities.    Baseline 6/29: 8/10 pain    Time 2    Period Weeks    Status New    Target Date 07/01/20             PT Long Term Goals - 06/17/20 1329      PT LONG TERM GOAL #1   Title Patient will report a worst pain of 3/10 on VAS in her head/migraines to improve tolerance with ADLs and reduced symptoms with activities    Baseline 5/26: 9/10    Time 8    Period Weeks    Status New    Target Date 07/09/20      PT LONG TERM GOAL #2   Title Patient will increase FOTO score to equal to or greater than   45/100  to demonstrate statistically significant improvement in mobility and quality of life.    Baseline 5/26: 44 with risk adjusted 33/100    Time 8    Period Weeks    Status New    Target Date 07/09/20      PT LONG TERM GOAL #3   Title Patient will reduce dizziness handicap inventory score to <30, for less dizziness with ADLs and increased safety with home and work tasks.    Baseline 5/26: 40    Time 8    Period Weeks    Status New    Target Date 07/09/20      PT LONG TERM GOAL #4   Title Patient will improve cervical  rotation ROM to >/=60 degrees bilaterally to improve functional mobility and demonstrate improved muscle tissue length    Baseline 5/26: R 46 L 42    Time 8    Period Weeks    Status New    Target Date 07/09/20      PT LONG TERM GOAL #5   Title Patient will report a worst pain of 3/10 on VAS in  thoracic and lumbar spine    to  improve tolerance with ADLs and reduced symptoms with activities and caregiver duties    Baseline 6/29: 8/10    Time 8    Period Weeks    Status New    Target Date 07/09/20      PT LONG TERM GOAL #6   Title Patient will tolerate sitting unsupported demonstrating erect sitting posture with minimal thoracic kyphosis for 15+ minutes with maximum of 5/10 back pain to demonstrate improved back extensor strength and improved sitting tolerance.    Baseline 6/29: painful and limited    Time 8    Period Weeks    Status New    Target Date 07/09/20      PT LONG TERM GOAL #7   Title Patient will reduce modified Oswestry score to <20 as to demonstrate minimal disability with ADLs including improved sleeping tolerance, walking/sitting tolerance etc for better mobility with ADLs.    Baseline 6/29: 46%    Time 8    Period Weeks    Status New    Target Date 07/09/20                 Plan - 06/17/20 1327    Clinical Impression Statement Patient received new order for back pain to add to current POC. Patient's lumbar and thoracic spine assessed with hypomobility and muscle guarding noted. Postural dysfunction with additional presence of lymphoma on thoracic spine noted.  MODI scored at 46% indicating high involvement/impairment during ADL tasks. Hep for spine added with patient demonstrating understanding. Patient will benefit from skilled physical therapy to decrease pain, improve posture, and improve quality of life.    Personal Factors and Comorbidities Age;Comorbidity 3+;Fitness;Finances;Past/Current Experience;Social Background;Time since onset of  injury/illness/exacerbation;Other    Comorbidities aorta type A dissection (2011) with aortic valve replacement, bypass of R Coronary artery, hypertension, hyperlipidemia, CHF, pre-diabetes, AFIB, anxiety, depression, arthritis, GERD, atypical migraine, asthma, allergic rhinitis.    Examination-Activity Limitations [place];Caring for Others;Carry;Dressing;Stairs;Squat;Sit;Reach Overhead;Locomotion Level;Lift;Stand;Toileting;Transfers    Examination-Participation Restrictions Church;Cleaning;Community Activity;Interpersonal Relationship;Driving;Laundry;Volunteer;Shop;Personal Finances;Meal Prep;Medication Management;Yard Work    Merchant [employer] officer Evolving/Moderate complexity    Clinical Decision Making Moderate    Rehab Potential Fair    Clinical Impairments Affecting Rehab Potential + CAD testing, history of AAA, anemia, anxiety, asthma, CAD, chronic combined ystolic heart failure, GERD, Marfan syndrome, OA, RA (+) good work Psychologist, forensic, motivated, age     PT Frequency 2x / week    PT Duration 8 weeks    PT Treatment/Interventions ADLs/Self Care Home Management;Aquatic Therapy;Vestibular;Vasopneumatic Device;Taping;Passive range of motion;Dry needling;Energy conservation;Manual techniques;Therapeutic exercise;Balance training;Neuromuscular re-education;Patient/family education;Therapeutic activities;Functional mobility training;Stair training;Gait training;Electrical Stimulation;Iontophoresis 4mg /ml Dexamethasone;Moist Heat;Traction;Ultrasound;Cryotherapy;Canalith Repostioning;Joint Manipulations;Spinal Manipulations    PT Next Visit Plan back interventions; posture    PT Home Exercise Plan Educated in seated Lev Scap and U/T stretches for HEP, as well as scap pinches and chin tucks.    Consulted and Agree with Plan of Care Patient           Patient will benefit from skilled therapeutic intervention in order to improve the following deficits and impairments:  Cardiopulmonary status  limiting activity, Decreased activity tolerance, Decreased endurance, Decreased mobility, Decreased range of motion, Decreased strength, Difficulty walking, Dizziness, Hypomobility, Impaired flexibility, Impaired perceived functional ability, Increased muscle spasms, Impaired UE functional use, Impaired vision/preception, Obesity, Postural dysfunction, Improper body mechanics, Pain, Abnormal gait  Visit Diagnosis: Cervicalgia  Abnormal posture  Chronic low back pain, unspecified back pain laterality, unspecified whether sciatica present  Pain in thoracic spine     Problem  List Patient Active Problem List   Diagnosis Date Noted  . Acute bronchitis with bronchospasm 05/28/2020  . Diarrhea 04/11/2020  . Epigastric pain 02/19/2020  . Dark stools 02/19/2020  . PAF (paroxysmal atrial fibrillation) ([place]) 02/05/2019  . HPV (human papilloma virus) infection 12/01/2016  . Screening mammogram, encounter for 11/29/2016  . Estrogen deficiency 11/29/2016  . Encounter for routine gynecological examination 11/29/2016  . Grade III hemorrhoids 10/06/2016  . Tachycardia 08/14/2016  . Hemorrhoids 08/13/2016  . Atypical migraine 08/03/2016  . CAD (coronary artery disease)   . AAA (abdominal aortic aneurysm) ([place])   . Chronic combined systolic (congestive) and diastolic (congestive) heart failure ([place])   . Prediabetes 02/11/2016  . Generalized anxiety disorder 12/08/2015  . Panic attacks 07/28/2015  . Sensorineural hearing loss 03/27/2013  . Vitamin D deficiency 07/03/2012  . H/O aortic dissection 05/12/2012  . Class 2 obesity due to excess calories with body mass index (BMI) of 35.0 to 35.9 in adult 11/21/2011  . Obesity (BMI 30.0-34.9) 11/21/2011  . Routine general medical examination at a health care facility 09/20/2011  . S/P AVR (aortic valve replacement) 01/07/2011  . CORONARY ARTERY BYPASS GRAFT, HX OF 01/07/2011  . Arthritis 12/20/2010  . Back pain 12/15/2010  . H/O dissecting  abdominal aortic aneurysm repair 08/24/2010  . IRRITABLE BOWEL SYNDROME 09/09/2009  . Obstructive sleep apnea 08/04/2009  . External hemorrhoid 06/26/2009  . Osteoporosis 01/09/2009  . Hyperlipidemia 07/26/2007  . Depression with anxiety 07/26/2007  . Essential hypertension 07/26/2007  . Allergic rhinitis 07/26/2007  . Asthma 07/26/2007  . GERD 07/26/2007  . Osteoarthritis 07/26/2007  . Ocular migraine 07/26/2007  . Mild intermittent asthma without complication 88/28/0034    Janna Arch, PT, DPT   06/17/2020, 1:33 PM  [place] MAIN [facility] SERVICES [address] [place], [place], [address] Phone: [phone]   Fax:  [phone]  Name: Rachel Torres MRN: [id number] Date of Birth: 1956/05/06

## 2020-06-18 ENCOUNTER — Ambulatory Visit (INDEPENDENT_AMBULATORY_CARE_PROVIDER_SITE_OTHER): Payer: Medicare HMO | Admitting: Psychology

## 2020-06-18 ENCOUNTER — Other Ambulatory Visit: Payer: Self-pay

## 2020-06-18 ENCOUNTER — Ambulatory Visit (INDEPENDENT_AMBULATORY_CARE_PROVIDER_SITE_OTHER): Payer: Medicare HMO | Admitting: Physician Assistant

## 2020-06-18 ENCOUNTER — Encounter (INDEPENDENT_AMBULATORY_CARE_PROVIDER_SITE_OTHER): Payer: Self-pay | Admitting: Physician Assistant

## 2020-06-18 VITALS — BP 114/67 | HR 98 | Temp 98.3°F | Ht 65.0 in | Wt 201.0 lb

## 2020-06-18 DIAGNOSIS — E7849 Other hyperlipidemia: Secondary | ICD-10-CM

## 2020-06-18 DIAGNOSIS — Z6833 Body mass index (BMI) 33.0-33.9, adult: Secondary | ICD-10-CM

## 2020-06-18 DIAGNOSIS — F4323 Adjustment disorder with mixed anxiety and depressed mood: Secondary | ICD-10-CM

## 2020-06-18 DIAGNOSIS — E669 Obesity, unspecified: Secondary | ICD-10-CM | POA: Diagnosis not present

## 2020-06-18 NOTE — Progress Notes (Signed)
Chief Complaint:   OBESITY Rachel Torres is here to discuss her progress with her obesity treatment plan along with follow-up of her obesity related diagnoses. Rachel Torres is on the Keto diet and states she is following her eating plan approximately 99% of the time. Rachel Torres states she is doing PT 20 minutes 2-3 times per week.  Today's visit was #: 3 Starting weight: 213 lbs Starting date: 05/05/2020 Today's weight: 201 lbs Today's date: 06/18/2020 Total lbs lost to date: 12 Total lbs lost since last in-office visit: 4  Interim History: Rachel Torres reports that she has previously lost weight with Keto and continues to eat low carb, higher protein, and some fats (not traditional Keto).  Subjective:   Other hyperlipidemia. Rachel Torres is on atorvastatin. No chest pain.   Lab Results  Component Value Date   CHOL 195 05/05/2020   HDL 56 05/05/2020   LDLCALC 116 (H) 05/05/2020   LDLDIRECT 189.4 10/13/2011   TRIG 133 05/05/2020   CHOLHDL 3 06/07/2019   Lab Results  Component Value Date   ALT 23 02/19/2020   AST 22 02/19/2020   ALKPHOS 67 02/19/2020   BILITOT 0.6 02/19/2020   The 10-year ASCVD risk score Rachel Bussing DC Jr., et al., 2013) is: 4.2%   Values used to calculate the score:     Age: 64 years     Sex: Female     Is Non-Hispanic African American: No     Diabetic: No     Tobacco smoker: No     Systolic Blood Pressure: 924 mmHg     Is BP treated: Yes     HDL Cholesterol: 56 mg/dL     Total Cholesterol: 195 mg/dL  Assessment/Plan:   Other hyperlipidemia. Cardiovascular risk and specific lipid/LDL goals reviewed.  We discussed several lifestyle modifications today and Rachel Torres will continue to work on diet, exercise and weight loss efforts. Orders and follow up as documented in patient record. She will continue her medication as directed.   Counseling Intensive lifestyle modifications are the first line treatment for this issue. . Dietary changes: Increase soluble fiber.  Decrease simple carbohydrates. . Exercise changes: Moderate to vigorous-intensity aerobic activity 150 minutes per week if tolerated. . Lipid-lowering medications: see documented in medical record.  Class 1 obesity with serious comorbidity and body mass index (BMI) of 33.0 to 33.9 in adult, unspecified obesity type.  Rachel Torres is currently in the action stage of change. As such, her goal is to continue with weight loss efforts. She has agreed to following a lower carbohydrate, vegetable and lean protein rich diet plan.   Exercise goals: For substantial health benefits, adults should do at least 150 minutes (2 hours and 30 minutes) a week of moderate-intensity, or 75 minutes (1 hour and 15 minutes) a week of vigorous-intensity aerobic physical activity, or an equivalent combination of moderate- and vigorous-intensity aerobic activity. Aerobic activity should be performed in episodes of at least 10 minutes, and preferably, it should be spread throughout the week.  Behavioral modification strategies: meal planning and cooking strategies and keeping healthy foods in the home.  Rachel Torres has agreed to follow-up with our clinic in 2 weeks. She was informed of the importance of frequent follow-up visits to maximize her success with intensive lifestyle modifications for her multiple health conditions.   Objective:   Blood pressure 114/67, pulse 98, temperature 98.3 F (36.8 C), temperature source Oral, height 5\' 5"  (1.651 m), weight 201 lb (91.2 kg), SpO2 96 %. Body mass index  is 33.45 kg/m.  General: Cooperative, alert, well developed, in no acute distress. HEENT: Conjunctivae and lids unremarkable. Cardiovascular: Regular rhythm.  Lungs: Normal work of breathing. Neurologic: No focal deficits.   Lab Results  Component Value Date   CREATININE 0.91 02/19/2020   BUN 12 02/19/2020   NA 140 02/19/2020   K 4.1 02/19/2020   CL 102 02/19/2020   CO2 31 02/19/2020   Lab Results  Component Value Date    ALT 23 02/19/2020   AST 22 02/19/2020   ALKPHOS 67 02/19/2020   BILITOT 0.6 02/19/2020   Lab Results  Component Value Date   HGBA1C 5.9 (H) 05/05/2020   HGBA1C 6.0 06/07/2019   HGBA1C 6.1 06/26/2018   HGBA1C 6.0 11/24/2017   HGBA1C 5.5 11/23/2016   Lab Results  Component Value Date   INSULIN 25.2 (H) 05/05/2020   Lab Results  Component Value Date   TSH 1.040 05/05/2020   Lab Results  Component Value Date   CHOL 195 05/05/2020   HDL 56 05/05/2020   LDLCALC 116 (H) 05/05/2020   LDLDIRECT 189.4 10/13/2011   TRIG 133 05/05/2020   CHOLHDL 3 06/07/2019   Lab Results  Component Value Date   WBC 5.8 02/19/2020   HGB 13.0 02/19/2020   HCT 38.5 02/19/2020   MCV 95.4 02/19/2020   PLT 284.0 02/19/2020   Lab Results  Component Value Date   FERRITIN 105.0 12/11/2010   Attestation Statements:   Reviewed by clinician on day of visit: allergies, medications, problem list, medical history, surgical history, family history, social history, and previous encounter notes.  Time spent on visit including pre-visit chart review and post-visit charting and care was 30 minutes.   IMichaelene Song, am acting as transcriptionist for Abby Potash, PA-C   I have reviewed the above documentation for accuracy and completeness, and I agree with the above. Abby Potash, PA-C

## 2020-06-19 ENCOUNTER — Ambulatory Visit: Payer: Medicare HMO | Attending: Family Medicine

## 2020-06-19 DIAGNOSIS — G8929 Other chronic pain: Secondary | ICD-10-CM | POA: Insufficient documentation

## 2020-06-19 DIAGNOSIS — M542 Cervicalgia: Secondary | ICD-10-CM

## 2020-06-19 DIAGNOSIS — M545 Low back pain, unspecified: Secondary | ICD-10-CM

## 2020-06-19 DIAGNOSIS — R293 Abnormal posture: Secondary | ICD-10-CM | POA: Diagnosis not present

## 2020-06-19 DIAGNOSIS — M546 Pain in thoracic spine: Secondary | ICD-10-CM | POA: Diagnosis not present

## 2020-06-19 NOTE — Therapy (Signed)
[place] [facility] SERVICES [address] [place], [place], [address] Phone: [phone]   Fax:  [phone]  Physical Therapy Treatment  Patient Details  Name: Rachel Torres MRN: [id number] Date of Birth: 1956-03-04 Referring Provider (PT): Glori Bickers, Wynelle Fanny MD   Encounter Date: 06/19/2020   PT End of Session - 06/19/20 1533    Visit Number 7    Number of Visits 16    Date for PT Re-Evaluation 07/09/20    Authorization Type 7/10 eval 05/14/20    Authorization Time Period back pain added to POC on 06/17/20    PT Start Time 1444    PT Stop Time 1527    PT Time Calculation (min) 43 min    Activity Tolerance Patient tolerated treatment well;Patient limited by pain    Behavior During Therapy [facility] for tasks assessed/performed           Past Medical History:  Diagnosis Date   AAA (abdominal aortic aneurysm) ([place])    a. Chronic w/o evidence of aneurysmal dil on CTA 01/2016.   Alcohol abuse    Allergy    Anemia    Anxiety    CAD (coronary artery disease)    a. 08/2010 s/p CABG x 1 (VG->RCA) @ time of Ao dissection repair; b. 01/2016 Lexiscan MV: mid antsept/apical defect w/ ? peri-infarct ischemia-->likely attenuation-->Med Rx.   Chewing difficulty    Chronic combined systolic (congestive) and diastolic (congestive) heart failure ([place])    a. 2012 EF 30-35%; b. 04/2015 EF 40-45%; c. 01/2016 Echo: Ef 55-65%, nl AoV bioprosthesis, nl RV, nl PASP.   Constipation    Depression    Edema of both lower extremities    Gallbladder sludge    GERD (gastroesophageal reflux disease)    Heart failure (HCC)    Heart valve problem    History of Bicuspid Aortic Valve    a. 08/2010 s/p AVR @ time of Ao dissection repair; b. 01/2016 Echo: Ef 55-65%, nl AoV bioprosthesis, nl RV, nl PASP.   Hx of repair of dissecting thoracic aortic aneurysm, [place] type A    a. 08/2010 s/p repair with AVR and VG->RCA; b. 01/2016 CTA: stable appearance of Asc  Thoracic Aortic graft. Opacification of flase lumen of chronic abd Ao dissection w/ retrograde flow through lumbar arteries. No aneurysmal dil of flase lumen.   Hyperlipidemia    Hypertensive heart disease    IBS (irritable bowel syndrome)    Internal hemorrhoids    Kidney problem    Marfan syndrome    pt denies   Migraine    Obstructive sleep apnea    Osteoarthritis    Osteoporosis    Palpitations    Pneumonia    RA (rheumatoid arthritis) ([place])    a. Followed by Dr. [name]    Past Surgical History:  Procedure Laterality Date   ABDOMINAL AORTIC ANEURYSM REPAIR     CARDIAC CATHETERIZATION  2001   [place]     ESOPHAGOGASTRODUODENOSCOPY (EGD) WITH PROPOFOL N/A 05/08/2020   Procedure: ESOPHAGOGASTRODUODENOSCOPY (EGD) WITH PROPOFOL;  Surgeon: Virgel Manifold, MD;  Location: [facility] ENDOSCOPY;  Service: Endoscopy;  Laterality: N/A;   FRACTURE SURGERY Right    shoulder replacement   HEMORRHOID BANDING     ORIF DISTAL RADIUS FRACTURE Left    UPPER GASTROINTESTINAL ENDOSCOPY      There were no vitals filed for this visit.   Subjective Assessment -  06/19/20 1529    Subjective Patient reports her back hurt yesterday when trying to pick up an object. Has been compliant with HEP.    Pertinent History Patient has been seen by this therapist in the past for back related injuries/pain. Was last seen by this author in December of 2019. Since discharge patient has had COVID and an upper GI due to stomach issues. PMH includes aorta type A dissection (2011) with aortic valve replacement, bypass of R Coronary artery, hypertension, hyperlipidemia, CHF, pre-diabetes, AFIB, anxiety, depression, arthritis, GERD, atypical migraine, asthma, allergic rhinitis. 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 Does now have help with aide.    Limitations Sitting;Reading;Lifting;Standing;Walking;Writing;Other (comment);House hold  activities    How long can you sit comfortably? 20 mins before whole body discomfort    How long can you stand comfortably? 5 mins  or less: lower back    How long can you walk comfortably? 2 minutes    Patient Stated Goals to decrease pain and migraines    Currently in Pain? Yes    Pain Score 3     Pain Location --   lumbar, thoracic, cervical   Pain Descriptors / Indicators Aching;Stabbing    Pain Type Chronic pain;Neuropathic pain    Pain Onset More than a month ago    Pain Frequency Intermittent              Treat:  Manual Prone:              CPAs and UPAs grade II-IV to thoracic and lumbar spine, 4x 10 seconds each level.Hypomobility throughout with reduction with repetition. T10 and L2 hypomobile              Inferior SI mobilization grade III each side and central             Roller to piriformis: R side 2 minutes              Roller to paraspinals L and R 2 minutes  Prone STM with roller to thoracic and lumbar paraspinals L and R; multiple adhesions throughout paraspinal musculature with pain relief reported.   Supine             LE rotation 60 seconds             Figure 4 with distraction belt 10x 4 second inferior glides.              Nerve glides via SLR with LE on PT shoulder 20x      TherEx             Posterior pelvic tilt 15 x 5 second holds             GTB abduction 10x 3 second holds ; cues for keeping pelvis posteriorly rotated                  swiss ball TrA contraction between knees and arms 10x 10 second holds             [place] press pectoral stretch 2x 30 seconds         Patient presents with excellent motivation to physical therapy session. She demonstrates multiple regions of muscle guarding resulting in poor mobility and compensatory techniques. Use of manual and therex allow for improved spinal alignment and neutral postural correction. Patient will benefit from skilled physical therapy to decrease pain, improve posture, and improve quality of  life.  PT Education - 06/19/20 1532    Education provided Yes    Education Details exercise technique, manual, therex    Person(s) Educated Patient    Methods Explanation;Demonstration;Tactile cues;Verbal cues    Comprehension Verbalized understanding;Returned demonstration;Verbal cues required;Tactile cues required            PT Short Term Goals - 06/17/20 1328      PT SHORT TERM GOAL #1   Title Patient will be independent in home exercise program to improve strength/mobility and decrease pain for better functional independence with ADLs.    Baseline 5/26: HEP next session    Time 2    Period Weeks    Status New    Target Date 05/28/20      PT SHORT TERM GOAL #2   Title Patient will report a worst pain of 7/10 on VAS in head to improve tolerance with ADLs and reduced symptoms with activities.    Baseline 5/26: 9/10    Time 2    Period Weeks    Status New    Target Date 05/28/20      PT SHORT TERM GOAL #3   Title Patient will report a worst pain of 6/10 on VAS in thoracic and lumbar spine to improve tolerance with ADLs and reduced symptoms with activities.    Baseline 6/29: 8/10 pain    Time 2    Period Weeks    Status New    Target Date 07/01/20             PT Long Term Goals - 06/17/20 1329      PT LONG TERM GOAL #1   Title Patient will report a worst pain of 3/10 on VAS in her head/migraines to improve tolerance with ADLs and reduced symptoms with activities    Baseline 5/26: 9/10    Time 8    Period Weeks    Status New    Target Date 07/09/20      PT LONG TERM GOAL #2   Title Patient will increase FOTO score to equal to or greater than   45/100  to demonstrate statistically significant improvement in mobility and quality of life.    Baseline 5/26: 44 with risk adjusted 33/100    Time 8    Period Weeks    Status New    Target Date 07/09/20      PT LONG TERM GOAL #3   Title Patient will reduce dizziness handicap inventory  score to <30, for less dizziness with ADLs and increased safety with home and work tasks.    Baseline 5/26: 40    Time 8    Period Weeks    Status New    Target Date 07/09/20      PT LONG TERM GOAL #4   Title Patient will improve cervical rotation ROM to >/=60 degrees bilaterally to improve functional mobility and demonstrate improved muscle tissue length    Baseline 5/26: R 46 L 42    Time 8    Period Weeks    Status New    Target Date 07/09/20      PT LONG TERM GOAL #5   Title Patient will report a worst pain of 3/10 on VAS in  thoracic and lumbar spine    to improve tolerance with ADLs and reduced symptoms with activities and caregiver duties    Baseline 6/29: 8/10    Time 8    Period Weeks    Status New    Target  Date 07/09/20      PT LONG TERM GOAL #6   Title Patient will tolerate sitting unsupported demonstrating erect sitting posture with minimal thoracic kyphosis for 15+ minutes with maximum of 5/10 back pain to demonstrate improved back extensor strength and improved sitting tolerance.    Baseline 6/29: painful and limited    Time 8    Period Weeks    Status New    Target Date 07/09/20      PT LONG TERM GOAL #7   Title Patient will reduce modified Oswestry score to <20 as to demonstrate minimal disability with ADLs including improved sleeping tolerance, walking/sitting tolerance etc for better mobility with ADLs.    Baseline 6/29: 46%    Time 8    Period Weeks    Status New    Target Date 07/09/20                 Plan - 06/19/20 1533    Clinical Impression Statement Patient presents with excellent motivation to physical therapy session. She demonstrates multiple regions of muscle guarding resulting in poor mobility and compensatory techniques. Use of manual and therex allow for improved spinal alignment and neutral postural correction. Patient will benefit from skilled physical therapy to decrease pain, improve posture, and improve quality of life.     Personal Factors and Comorbidities Age;Comorbidity 3+;Fitness;Finances;Past/Current Experience;Social Background;Time since onset of injury/illness/exacerbation;Other    Comorbidities aorta type A dissection (2011) with aortic valve replacement, bypass of R Coronary artery, hypertension, hyperlipidemia, CHF, pre-diabetes, AFIB, anxiety, depression, arthritis, GERD, atypical migraine, asthma, allergic rhinitis.    Examination-Activity Limitations [place];Caring for Others;Carry;Dressing;Stairs;Squat;Sit;Reach Overhead;Locomotion Level;Lift;Stand;Toileting;Transfers    Examination-Participation Restrictions Church;Cleaning;Community Activity;Interpersonal Relationship;Driving;Laundry;Volunteer;Shop;Personal Finances;Meal Prep;Medication Management;Yard Work    Product manager    Rehab Potential Fair    Clinical Impairments Affecting Rehab Potential + CAD testing, history of AAA, anemia, anxiety, asthma, CAD, chronic combined ystolic heart failure, GERD, Marfan syndrome, OA, RA (+) good work Psychologist, forensic, motivated, age     PT Frequency 2x / week    PT Duration 8 weeks    PT Treatment/Interventions ADLs/Self Care Home Management;Aquatic Therapy;Vestibular;Vasopneumatic Device;Taping;Passive range of motion;Dry needling;Energy conservation;Manual techniques;Therapeutic exercise;Balance training;Neuromuscular re-education;Patient/family education;Therapeutic activities;Functional mobility training;Stair training;Gait training;Electrical Stimulation;Iontophoresis 4mg /ml Dexamethasone;Moist Heat;Traction;Ultrasound;Cryotherapy;Canalith Repostioning;Joint Manipulations;Spinal Manipulations    PT Next Visit Plan back interventions; posture    PT Home Exercise Plan Educated in seated Lev Scap and U/T stretches for HEP, as well as scap pinches and chin tucks.    Consulted and Agree with Plan of Care Patient           Patient will benefit from skilled therapeutic  intervention in order to improve the following deficits and impairments:  Cardiopulmonary status limiting activity, Decreased activity tolerance, Decreased endurance, Decreased mobility, Decreased range of motion, Decreased strength, Difficulty walking, Dizziness, Hypomobility, Impaired flexibility, Impaired perceived functional ability, Increased muscle spasms, Impaired UE functional use, Impaired vision/preception, Obesity, Postural dysfunction, Improper body mechanics, Pain, Abnormal gait  Visit Diagnosis: Cervicalgia  Abnormal posture  Chronic low back pain, unspecified back pain laterality, unspecified whether sciatica present  Pain in thoracic spine     Problem List Patient Active Problem List   Diagnosis Date Noted   Acute bronchitis with bronchospasm 05/28/2020   Diarrhea 04/11/2020   Epigastric pain 02/19/2020   Dark stools 02/19/2020   PAF (paroxysmal atrial fibrillation) ([place]) 02/05/2019   HPV (human papilloma virus) infection 12/01/2016   Screening mammogram, encounter for 11/29/2016   Estrogen deficiency 11/29/2016  Encounter for routine gynecological examination 11/29/2016   Grade III hemorrhoids 10/06/2016   Tachycardia 08/14/2016   Hemorrhoids 08/13/2016   Atypical migraine 08/03/2016   CAD (coronary artery disease)    AAA (abdominal aortic aneurysm) (HCC)    Chronic combined systolic (congestive) and diastolic (congestive) heart failure ([place])    Prediabetes 02/11/2016   Generalized anxiety disorder 12/08/2015   Panic attacks 07/28/2015   Sensorineural hearing loss 03/27/2013   Vitamin D deficiency 07/03/2012   H/O aortic dissection 05/12/2012   Class 2 obesity due to excess calories with body mass index (BMI) of 35.0 to 35.9 in adult 11/21/2011   Obesity (BMI 30.0-34.9) 11/21/2011   Routine general medical examination at a health care facility 09/20/2011   S/P AVR (aortic valve replacement) 01/07/2011   CORONARY ARTERY BYPASS  GRAFT, HX OF 01/07/2011   Arthritis 12/20/2010   Back pain 12/15/2010   H/O dissecting abdominal aortic aneurysm repair 08/24/2010   IRRITABLE BOWEL SYNDROME 09/09/2009   Obstructive sleep apnea 08/04/2009   External hemorrhoid 06/26/2009   Osteoporosis 01/09/2009   Hyperlipidemia 07/26/2007   Depression with anxiety 07/26/2007   Essential hypertension 07/26/2007   Allergic rhinitis 07/26/2007   Asthma 07/26/2007   GERD 07/26/2007   Osteoarthritis 07/26/2007   Ocular migraine 07/26/2007   Mild intermittent asthma without complication 15/72/6203   Janna Arch, PT, DPT   06/19/2020, 3:36 PM  [facility]IN [facility] SERVICES [address] [place], [place], [address] Phone: [phone]   Fax:  [phone]  Name: JERRICA THORMAN MRN: [id number] Date of Birth: [birth year]

## 2020-06-24 ENCOUNTER — Ambulatory Visit: Payer: Medicare HMO

## 2020-06-24 ENCOUNTER — Other Ambulatory Visit: Payer: Self-pay

## 2020-06-24 DIAGNOSIS — R293 Abnormal posture: Secondary | ICD-10-CM

## 2020-06-24 DIAGNOSIS — G8929 Other chronic pain: Secondary | ICD-10-CM | POA: Diagnosis not present

## 2020-06-24 DIAGNOSIS — M545 Low back pain, unspecified: Secondary | ICD-10-CM

## 2020-06-24 DIAGNOSIS — M546 Pain in thoracic spine: Secondary | ICD-10-CM | POA: Diagnosis not present

## 2020-06-24 DIAGNOSIS — M542 Cervicalgia: Secondary | ICD-10-CM | POA: Diagnosis not present

## 2020-06-24 NOTE — Therapy (Signed)
[place] MAIN [facility] SERVICES [address] [place], [place], [address] Phone: [phone]   Fax:  [phone]  Physical Therapy Treatment  Patient Details  Name: Rachel Torres MRN: [id number] Date of Birth: Nov 29, 1956 Referring Provider (PT): Glori Bickers, Wynelle Fanny MD   Encounter Date: 06/24/2020   PT End of Session - 06/24/20 1525    Visit Number 8    Number of Visits 16    Date for PT Re-Evaluation 07/09/20    Authorization Type 8/10 eval 05/14/20    Authorization Time Period back pain added to POC on 06/17/20    PT Start Time 1345    PT Stop Time 1429    PT Time Calculation (min) 44 min    Activity Tolerance Patient tolerated treatment well;Patient limited by pain    Behavior During Therapy [facility] for tasks assessed/performed           Past Medical History:  Diagnosis Date  . AAA (abdominal aortic aneurysm) ([place])    a. Chronic w/o evidence of aneurysmal dil on CTA 01/2016.  Marland Kitchen Alcohol abuse   . Allergy   . Anemia   . Anxiety   . CAD (coronary artery disease)    a. 08/2010 s/p CABG x 1 (VG->RCA) @ time of Ao dissection repair; b. 01/2016 Lexiscan MV: mid antsept/apical defect w/ ? peri-infarct ischemia-->likely attenuation-->Med Rx.  . Chewing difficulty   . Chronic combined systolic (congestive) and diastolic (congestive) heart failure ([place])    a. 2012 EF 30-35%; b. 04/2015 EF 40-45%; c. 01/2016 Echo: Ef 55-65%, nl AoV bioprosthesis, nl RV, nl PASP.  Marland Kitchen Constipation   . Depression   . Edema of both lower extremities   . Gallbladder sludge   . GERD (gastroesophageal reflux disease)   . Heart failure ([place])   . Heart valve problem   . History of Bicuspid Aortic Valve    a. 08/2010 s/p AVR @ time of Ao dissection repair; b. 01/2016 Echo: Ef 55-65%, nl AoV bioprosthesis, nl RV, nl PASP.  Marland Kitchen Hx of repair of dissecting thoracic aortic aneurysm, [place] type A    a. 08/2010 s/p repair with AVR and VG->RCA; b. 01/2016 CTA: stable appearance of Asc  Thoracic Aortic graft. Opacification of flase lumen of chronic abd Ao dissection w/ retrograde flow through lumbar arteries. No aneurysmal dil of flase lumen.  . Hyperlipidemia   . Hypertensive heart disease   . IBS (irritable bowel syndrome)   . Internal hemorrhoids   . Kidney problem   . Marfan syndrome    pt denies  . Migraine   . Obstructive sleep apnea   . Osteoarthritis   . Osteoporosis   . Palpitations   . Pneumonia   . RA (rheumatoid arthritis) ([place])    a. Followed by Dr. [name]    Past Surgical History:  Procedure Laterality Date  . ABDOMINAL AORTIC ANEURYSM REPAIR    . CARDIAC CATHETERIZATION  2001  . [place]  . CHOLECYSTECTOMY    . COLONOSCOPY    . ESOPHAGOGASTRODUODENOSCOPY (EGD) WITH PROPOFOL N/A 05/08/2020   Procedure: ESOPHAGOGASTRODUODENOSCOPY (EGD) WITH PROPOFOL;  Surgeon: Virgel Manifold, MD;  Location: [facility] ENDOSCOPY;  Service: Endoscopy;  Laterality: N/A;  . FRACTURE SURGERY Right    shoulder replacement  . HEMORRHOID BANDING    . ORIF DISTAL RADIUS FRACTURE Left   . UPPER GASTROINTESTINAL ENDOSCOPY      There were no vitals filed for this visit.   Subjective Assessment -  06/24/20 1354    Subjective Patient reports soreness in area of manual last session but no pain. Had one spasm on L mid back and one migraine since last session.    Pertinent History Patient has been seen by this therapist in the past for back related injuries/pain. Was last seen by this author in December of 2019. Since discharge patient has had COVID and an upper GI due to stomach issues. PMH includes aorta type A dissection (2011) with aortic valve replacement, bypass of R Coronary artery, hypertension, hyperlipidemia, CHF, pre-diabetes, AFIB, anxiety, depression, arthritis, GERD, atypical migraine, asthma, allergic rhinitis. 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 Does now have help with aide.    Limitations  Sitting;Reading;Lifting;Standing;Walking;Writing;Other (comment);House hold activities    How long can you sit comfortably? 20 mins before whole body discomfort    How long can you stand comfortably? 5 mins  or less: lower back    How long can you walk comfortably? 2 minutes    Patient Stated Goals to decrease pain and migraines    Currently in Pain? Yes    Pain Score 3     Pain Location --   cervical, thoracic, lumbar   Pain Orientation Mid;Upper;Lower    Pain Descriptors / Indicators Aching    Pain Type Chronic pain;Neuropathic pain    Pain Onset More than a month ago             Treatment:   Manual Prone:              CPAs and UPAs grade II-IV to thoracic and lumbar spine, 4x 10 seconds each level. Hypomobility throughout with reduction with repetition. T10 and L2 hypomobile              Inferior SI mobilization grade III L, R and central   Prone STM with implementation of effleurage and pettrisage to thoracic and lumbar paraspinals L and R; multiple adhesions throughout paraspinal musculature with pain relief reported.    Supine             LE rotation 60 seconds             Figure 4 with distraction belt 10x 4 second inferior glides.              Nerve glides via SLR with LE on PT shoulder 20x   Nerve glide 5x5 for sciatic nerve glide with use of towel  Seated:  Trigger point/soft tissue release with movement to improve soft tissue limited muscle tissue length.4 minutes  Seated scapular retractions 20x  STM with focus on levator scap, upper trap, cervical paraspinals, multiple trigger points requiring release x49minutes   TherEx             Posterior pelvic tilt 15 x 5 second holds             GTB abduction 10x 3 second holds ; cues for keeping pelvis posteriorly rotated                  swiss ball TrA contraction between knees and arms 10x 10 second holds                  Pt educated throughout session about proper posture and technique with exercises.  Improved exercise technique, movement at target joints, use of target muscles after min to mod verbal, visual, tactile cues  PT Education - 06/24/20 1525    Education provided Yes    Education Details manual, posture, therex    Person(s) Educated Patient    Methods Explanation;Demonstration;Tactile cues;Verbal cues    Comprehension Verbalized understanding;Returned demonstration;Verbal cues required;Tactile cues required            PT Short Term Goals - 06/17/20 1328      PT SHORT TERM GOAL #1   Title Patient will be independent in home exercise program to improve strength/mobility and decrease pain for better functional independence with ADLs.    Baseline 5/26: HEP next session    Time 2    Period Weeks    Status New    Target Date 05/28/20      PT SHORT TERM GOAL #2   Title Patient will report a worst pain of 7/10 on VAS in head to improve tolerance with ADLs and reduced symptoms with activities.    Baseline 5/26: 9/10    Time 2    Period Weeks    Status New    Target Date 05/28/20      PT SHORT TERM GOAL #3   Title Patient will report a worst pain of 6/10 on VAS in thoracic and lumbar spine to improve tolerance with ADLs and reduced symptoms with activities.    Baseline 6/29: 8/10 pain    Time 2    Period Weeks    Status New    Target Date 07/01/20             PT Long Term Goals - 06/17/20 1329      PT LONG TERM GOAL #1   Title Patient will report a worst pain of 3/10 on VAS in her head/migraines to improve tolerance with ADLs and reduced symptoms with activities    Baseline 5/26: 9/10    Time 8    Period Weeks    Status New    Target Date 07/09/20      PT LONG TERM GOAL #2   Title Patient will increase FOTO score to equal to or greater than   45/100  to demonstrate statistically significant improvement in mobility and quality of life.    Baseline 5/26: 44 with risk adjusted 33/100    Time 8    Period Weeks     Status New    Target Date 07/09/20      PT LONG TERM GOAL #3   Title Patient will reduce dizziness handicap inventory score to <30, for less dizziness with ADLs and increased safety with home and work tasks.    Baseline 5/26: 40    Time 8    Period Weeks    Status New    Target Date 07/09/20      PT LONG TERM GOAL #4   Title Patient will improve cervical rotation ROM to >/=60 degrees bilaterally to improve functional mobility and demonstrate improved muscle tissue length    Baseline 5/26: R 46 L 42    Time 8    Period Weeks    Status New    Target Date 07/09/20      PT LONG TERM GOAL #5   Title Patient will report a worst pain of 3/10 on VAS in  thoracic and lumbar spine    to improve tolerance with ADLs and reduced symptoms with activities and caregiver duties    Baseline 6/29: 8/10    Time 8    Period Weeks    Status New    Target Date  07/09/20      PT LONG TERM GOAL #6   Title Patient will tolerate sitting unsupported demonstrating erect sitting posture with minimal thoracic kyphosis for 15+ minutes with maximum of 5/10 back pain to demonstrate improved back extensor strength and improved sitting tolerance.    Baseline 6/29: painful and limited    Time 8    Period Weeks    Status New    Target Date 07/09/20      PT LONG TERM GOAL #7   Title Patient will reduce modified Oswestry score to <20 as to demonstrate minimal disability with ADLs including improved sleeping tolerance, walking/sitting tolerance etc for better mobility with ADLs.    Baseline 6/29: 46%    Time 8    Period Weeks    Status New    Target Date 07/09/20                 Plan - 06/24/20 1526    Clinical Impression Statement Patient reports reproduction of radiating pain to eye with trigger point release of R levator scap insertion point. Postural mechanics are limited by muscle fatigue and multiple muscle adhesions. Patient will benefit from skilled physical therapy to decrease pain, improve  posture, and improve quality of life.Patient will benefit from skilled physical therapy to decrease pain, improve posture, and improve quality of life.    Personal Factors and Comorbidities Age;Comorbidity 3+;Fitness;Finances;Past/Current Experience;Social Background;Time since onset of injury/illness/exacerbation;Other    Comorbidities aorta type A dissection (2011) with aortic valve replacement, bypass of R Coronary artery, hypertension, hyperlipidemia, CHF, pre-diabetes, AFIB, anxiety, depression, arthritis, GERD, atypical migraine, asthma, allergic rhinitis.    Examination-Activity Limitations Bend;Caring for Others;Carry;Dressing;Stairs;Squat;Sit;Reach Overhead;Locomotion Level;Lift;Stand;Toileting;Transfers    Examination-Participation Restrictions Church;Cleaning;Community Activity;Interpersonal Relationship;Driving;Laundry;Volunteer;Shop;Personal Finances;Meal Prep;Medication Management;Yard Work    Product manager    Rehab Potential Fair    Clinical Impairments Affecting Rehab Potential + CAD testing, history of AAA, anemia, anxiety, asthma, CAD, chronic combined ystolic heart failure, GERD, Marfan syndrome, OA, RA (+) good work Psychologist, forensic, motivated, age     PT Frequency 2x / week    PT Duration 8 weeks    PT Treatment/Interventions ADLs/Self Care Home Management;Aquatic Therapy;Vestibular;Vasopneumatic Device;Taping;Passive range of motion;Dry needling;Energy conservation;Manual techniques;Therapeutic exercise;Balance training;Neuromuscular re-education;Patient/family education;Therapeutic activities;Functional mobility training;Stair training;Gait training;Electrical Stimulation;Iontophoresis 4mg /ml Dexamethasone;Moist Heat;Traction;Ultrasound;Cryotherapy;Canalith Repostioning;Joint Manipulations;Spinal Manipulations    PT Next Visit Plan back interventions; posture    PT Home Exercise Plan Educated in seated Lev Scap and U/T stretches for HEP, as  well as scap pinches and chin tucks.    Consulted and Agree with Plan of Care Patient           Patient will benefit from skilled therapeutic intervention in order to improve the following deficits and impairments:  Cardiopulmonary status limiting activity, Decreased activity tolerance, Decreased endurance, Decreased mobility, Decreased range of motion, Decreased strength, Difficulty walking, Dizziness, Hypomobility, Impaired flexibility, Impaired perceived functional ability, Increased muscle spasms, Impaired UE functional use, Impaired vision/preception, Obesity, Postural dysfunction, Improper body mechanics, Pain, Abnormal gait  Visit Diagnosis: Cervicalgia  Abnormal posture  Chronic low back pain, unspecified back pain laterality, unspecified whether sciatica present  Pain in thoracic spine     Problem List Patient Active Problem List   Diagnosis Date Noted  . Acute bronchitis with bronchospasm 05/28/2020  . Diarrhea 04/11/2020  . Epigastric pain 02/19/2020  . Dark stools 02/19/2020  . PAF (paroxysmal atrial fibrillation) ([place]) 02/05/2019  . HPV (human papilloma virus) infection 12/01/2016  . Screening mammogram, encounter for 11/29/2016  .  Estrogen deficiency 11/29/2016  . Encounter for routine gynecological examination 11/29/2016  . Grade III hemorrhoids 10/06/2016  . Tachycardia 08/14/2016  . Hemorrhoids 08/13/2016  . Atypical migraine 08/03/2016  . CAD (coronary artery disease)   . AAA (abdominal aortic aneurysm) ([place])   . Chronic combined systolic (congestive) and diastolic (congestive) heart failure ([place])   . Prediabetes 02/11/2016  . Generalized anxiety disorder 12/08/2015  . Panic attacks 07/28/2015  . Sensorineural hearing loss 03/27/2013  . Vitamin D deficiency 07/03/2012  . H/O aortic dissection 05/12/2012  . Class 2 obesity due to excess calories with body mass index (BMI) of 35.0 to 35.9 in adult 11/21/2011  . Obesity (BMI 30.0-34.9) 11/21/2011  .  Routine general medical examination at a health care facility 09/20/2011  . S/P AVR (aortic valve replacement) 01/07/2011  . CORONARY ARTERY BYPASS GRAFT, HX OF 01/07/2011  . Arthritis 12/20/2010  . Back pain 12/15/2010  . H/O dissecting abdominal aortic aneurysm repair 08/24/2010  . IRRITABLE BOWEL SYNDROME 09/09/2009  . Obstructive sleep apnea 08/04/2009  . External hemorrhoid 06/26/2009  . Osteoporosis 01/09/2009  . Hyperlipidemia 07/26/2007  . Depression with anxiety 07/26/2007  . Essential hypertension 07/26/2007  . Allergic rhinitis 07/26/2007  . Asthma 07/26/2007  . GERD 07/26/2007  . Osteoarthritis 07/26/2007  . Ocular migraine 07/26/2007  . Mild intermittent asthma without complication 07/10/5749   Janna Arch, PT, DPT   06/24/2020, 3:27 PM  [place] MAIN [facility] SERVICES [address] [place], [place], [address] Phone: [phone]   Fax:  [phone]  Name: Rachel Torres MRN: [id number] Date of Birth: [birth year]

## 2020-06-25 ENCOUNTER — Other Ambulatory Visit: Payer: Self-pay | Admitting: Family Medicine

## 2020-06-25 ENCOUNTER — Ambulatory Visit: Payer: Medicare HMO

## 2020-06-25 ENCOUNTER — Other Ambulatory Visit: Payer: Self-pay

## 2020-06-25 DIAGNOSIS — R293 Abnormal posture: Secondary | ICD-10-CM | POA: Diagnosis not present

## 2020-06-25 DIAGNOSIS — G8929 Other chronic pain: Secondary | ICD-10-CM

## 2020-06-25 DIAGNOSIS — M545 Low back pain, unspecified: Secondary | ICD-10-CM

## 2020-06-25 DIAGNOSIS — M542 Cervicalgia: Secondary | ICD-10-CM | POA: Diagnosis not present

## 2020-06-25 DIAGNOSIS — M546 Pain in thoracic spine: Secondary | ICD-10-CM | POA: Diagnosis not present

## 2020-06-25 NOTE — Therapy (Signed)
[place] MAIN [facility] SERVICES [address] [place], [place], [address] Phone: [phone]   Fax:  [phone]  Physical Therapy Treatment  Patient Details  Name: Rachel Torres MRN: [id number] Date of Birth: Sep 03, 1956 Referring Provider (PT): Glori Bickers, Wynelle Fanny MD   Encounter Date: 06/25/2020   PT End of Session - 06/25/20 1631    Visit Number 9    Number of Visits 16    Date for PT Re-Evaluation 07/09/20    Authorization Type 8/10 eval 05/14/20    Authorization Time Period back pain added to POC on 06/17/20    PT Start Time 1519    PT Stop Time 1600    PT Time Calculation (min) 41 min    Activity Tolerance Patient tolerated treatment well    Behavior During Therapy [facility] for tasks assessed/performed           Past Medical History:  Diagnosis Date  . AAA (abdominal aortic aneurysm) ([place])    a. Chronic w/o evidence of aneurysmal dil on CTA 01/2016.  Marland Kitchen Alcohol abuse   . Allergy   . Anemia   . Anxiety   . CAD (coronary artery disease)    a. 08/2010 s/p CABG x 1 (VG->RCA) @ time of Ao dissection repair; b. 01/2016 Lexiscan MV: mid antsept/apical defect w/ ? peri-infarct ischemia-->likely attenuation-->Med Rx.  . Chewing difficulty   . Chronic combined systolic (congestive) and diastolic (congestive) heart failure ([place])    a. 2012 EF 30-35%; b. 04/2015 EF 40-45%; c. 01/2016 Echo: Ef 55-65%, nl AoV bioprosthesis, nl RV, nl PASP.  Marland Kitchen Constipation   . Depression   . Edema of both lower extremities   . Gallbladder sludge   . GERD (gastroesophageal reflux disease)   . Heart failure ([place])   . Heart valve problem   . History of Bicuspid Aortic Valve    a. 08/2010 s/p AVR @ time of Ao dissection repair; b. 01/2016 Echo: Ef 55-65%, nl AoV bioprosthesis, nl RV, nl PASP.  Marland Kitchen Hx of repair of dissecting thoracic aortic aneurysm, [place] type A    a. 08/2010 s/p repair with AVR and VG->RCA; b. 01/2016 CTA: stable appearance of Asc Thoracic Aortic graft.  Opacification of flase lumen of chronic abd Ao dissection w/ retrograde flow through lumbar arteries. No aneurysmal dil of flase lumen.  . Hyperlipidemia   . Hypertensive heart disease   . IBS (irritable bowel syndrome)   . Internal hemorrhoids   . Kidney problem   . Marfan syndrome    pt denies  . Migraine   . Obstructive sleep apnea   . Osteoarthritis   . Osteoporosis   . Palpitations   . Pneumonia   . RA (rheumatoid arthritis) ([place])    a. Followed by Dr. [name]    Past Surgical History:  Procedure Laterality Date  . ABDOMINAL AORTIC ANEURYSM REPAIR    . CARDIAC CATHETERIZATION  2001  . [place]  . CHOLECYSTECTOMY    . COLONOSCOPY    . ESOPHAGOGASTRODUODENOSCOPY (EGD) WITH PROPOFOL N/A 05/08/2020   Procedure: ESOPHAGOGASTRODUODENOSCOPY (EGD) WITH PROPOFOL;  Surgeon: Virgel Manifold, MD;  Location: [facility] ENDOSCOPY;  Service: Endoscopy;  Laterality: N/A;  . FRACTURE SURGERY Right    shoulder replacement  . HEMORRHOID BANDING    . ORIF DISTAL RADIUS FRACTURE Left   . UPPER GASTROINTESTINAL ENDOSCOPY      There were no vitals filed for this visit.   Subjective Assessment - 06/25/20 1629  Subjective Patient reports she is doing alright today. She has had a migraine but not currently. She has also had intermittent back spasms. No pain upon arrival today. No specific questions or concerns currently    Pertinent History Patient has been seen by this therapist in the past for back related injuries/pain. Was last seen by this author in December of 2019. Since discharge patient has had COVID and an upper GI due to stomach issues. PMH includes aorta type A dissection (2011) with aortic valve replacement, bypass of R Coronary artery, hypertension, hyperlipidemia, CHF, pre-diabetes, AFIB, anxiety, depression, arthritis, GERD, atypical migraine, asthma, allergic rhinitis. 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 Does now have help with aide.    Limitations  Sitting;Reading;Lifting;Standing;Walking;Writing;Other (comment);House hold activities    How long can you sit comfortably? 20 mins before whole body discomfort    How long can you stand comfortably? 5 mins  or less: lower back    How long can you walk comfortably? 2 minutes    Patient Stated Goals to decrease pain and migraines    Currently in Pain? No/denies              TREATMENT   Trigger Point Dry Needling (TDN), unbilled Education performed with patient regarding potential benefit of TDN. Reviewed precautions and risks with patient. Reviewed special precautions/risks over lung fields which include pneumothorax. Reviewed signs and symptoms of pneumothorax and advised pt to go to ER immediately if these symptoms develop advise them of dry needling treatment. Adequate time spent with pt to ensure full understanding of TDN risks. Pt provided verbal consent to treatment. TDN performed to bilateral upper traps, bilateral levator scapulae, and bilateral suboccipitals with 1, 0.25 x 40 single needle placements in each muscle on each side. Local twitch response (LTR) noted in bilateral upper trap with pt reporting deep ache in bilateral levator scapulae and suboccipitals. Pt also reports reproduction of scalp/headache pain during multiple placements. Pistoning technique utilized with some longer static holds in suboccipitals. Also performed TDN to lumbar multifidi at L3 with 0.35 x 75 single needle placement on each side. Significant LTR noted especially on L side. Improved pain-free cervical motion following intervention.    Manual Therapy  Prone:  CPAs and UPAs grade II-III mobilizations to C2-C7 20s/bout x 2 bouts/level; UPA grade II mobilizations to C1 20s/bout x 2 bouts/level bilateral; Prone grade III mobilizations to L1-L5, 20s/bout x 1 bout/level;  STM with focus on levator scap, upper trap, cervical paraspinals, multiple trigger points requiring release;   Pt educated throughout  session about proper posture and technique with exercises. Improved exercise technique, movement at target joints, use of target muscles after min to mod verbal, visual, tactile cues.     Patient does not present with any resting pain upon arrival today. Trigger point dry needling performed today with patient to bilateral upper traps, bilateral levator scapulae, bilateral suboccipitals, and bilateral lumbar multifidi. Also utilized mobilizations to cervical and lumbar spine including STM to posterior cervical/upper thoracic spine. Improved pain-free cervical motion following intervention. Pt encouraged to continue HEP and follow-up as scheduled. Patient will continue to benefit from skilled physical therapy to reduce pain, improve spinal mobility, and allow patient better quality of life.             PT Short Term Goals - 06/17/20 1328      PT SHORT TERM GOAL #1   Title Patient will be independent in home exercise program to improve strength/mobility and decrease pain for  better functional independence with ADLs.    Baseline 5/26: HEP next session    Time 2    Period Weeks    Status New    Target Date 05/28/20      PT SHORT TERM GOAL #2   Title Patient will report a worst pain of 7/10 on VAS in head to improve tolerance with ADLs and reduced symptoms with activities.    Baseline 5/26: 9/10    Time 2    Period Weeks    Status New    Target Date 05/28/20      PT SHORT TERM GOAL #3   Title Patient will report a worst pain of 6/10 on VAS in thoracic and lumbar spine to improve tolerance with ADLs and reduced symptoms with activities.    Baseline 6/29: 8/10 pain    Time 2    Period Weeks    Status New    Target Date 07/01/20             PT Long Term Goals - 06/17/20 1329      PT LONG TERM GOAL #1   Title Patient will report a worst pain of 3/10 on VAS in her head/migraines to improve tolerance with ADLs and reduced symptoms with activities    Baseline 5/26: 9/10    Time  8    Period Weeks    Status New    Target Date 07/09/20      PT LONG TERM GOAL #2   Title Patient will increase FOTO score to equal to or greater than   45/100  to demonstrate statistically significant improvement in mobility and quality of life.    Baseline 5/26: 44 with risk adjusted 33/100    Time 8    Period Weeks    Status New    Target Date 07/09/20      PT LONG TERM GOAL #3   Title Patient will reduce dizziness handicap inventory score to <30, for less dizziness with ADLs and increased safety with home and work tasks.    Baseline 5/26: 40    Time 8    Period Weeks    Status New    Target Date 07/09/20      PT LONG TERM GOAL #4   Title Patient will improve cervical rotation ROM to >/=60 degrees bilaterally to improve functional mobility and demonstrate improved muscle tissue length    Baseline 5/26: R 46 L 42    Time 8    Period Weeks    Status New    Target Date 07/09/20      PT LONG TERM GOAL #5   Title Patient will report a worst pain of 3/10 on VAS in  thoracic and lumbar spine    to improve tolerance with ADLs and reduced symptoms with activities and caregiver duties    Baseline 6/29: 8/10    Time 8    Period Weeks    Status New    Target Date 07/09/20      PT LONG TERM GOAL #6   Title Patient will tolerate sitting unsupported demonstrating erect sitting posture with minimal thoracic kyphosis for 15+ minutes with maximum of 5/10 back pain to demonstrate improved back extensor strength and improved sitting tolerance.    Baseline 6/29: painful and limited    Time 8    Period Weeks    Status New    Target Date 07/09/20      PT LONG TERM GOAL #7   Title Patient  will reduce modified Oswestry score to <20 as to demonstrate minimal disability with ADLs including improved sleeping tolerance, walking/sitting tolerance etc for better mobility with ADLs.    Baseline 6/29: 46%    Time 8    Period Weeks    Status New    Target Date 07/09/20                  Plan - 06/25/20 1631    Clinical Impression Statement Patient does not present with any resting pain upon arrival today. Trigger point dry needling performed today with patient to bilateral upper traps, bilateral levator scapulae, bilateral suboccipitals, and bilateral lumbar multifidi. Also utilized mobilizations to cervical and lumbar spine including STM to posterior cervical/upper thoracic spine. Improved pain-free cervical motion following intervention. Pt encouraged to continue HEP and follow-up as scheduled. Patient will continue to benefit from skilled physical therapy to reduce pain, improve spinal mobility, and allow patient better quality of life.    Personal Factors and Comorbidities Age;Comorbidity 3+;Fitness;Finances;Past/Current Experience;Social Background;Time since onset of injury/illness/exacerbation;Other    Comorbidities aorta type A dissection (2011) with aortic valve replacement, bypass of R Coronary artery, hypertension, hyperlipidemia, CHF, pre-diabetes, AFIB, anxiety, depression, arthritis, GERD, atypical migraine, asthma, allergic rhinitis.    Examination-Activity Limitations [place];Caring for Others;Carry;Dressing;Stairs;Squat;Sit;Reach Overhead;Locomotion Level;Lift;Stand;Toileting;Transfers    Examination-Participation Restrictions Church;Cleaning;Community Activity;Interpersonal Relationship;Driving;Laundry;Volunteer;Shop;Personal Finances;Meal Prep;Medication Management;Yard Work    Product manager    Rehab Potential Fair    Clinical Impairments Affecting Rehab Potential + CAD testing, history of AAA, anemia, anxiety, asthma, CAD, chronic combined ystolic heart failure, GERD, Marfan syndrome, OA, RA (+) good work Psychologist, forensic, motivated, age     PT Frequency 2x / week    PT Duration 8 weeks    PT Treatment/Interventions ADLs/Self Care Home Management;Aquatic Therapy;Vestibular;Vasopneumatic Device;Taping;Passive range of motion;Dry  needling;Energy conservation;Manual techniques;Therapeutic exercise;Balance training;Neuromuscular re-education;Patient/family education;Therapeutic activities;Functional mobility training;Stair training;Gait training;Electrical Stimulation;Iontophoresis 4mg /ml Dexamethasone;Moist Heat;Traction;Ultrasound;Cryotherapy;Canalith Repostioning;Joint Manipulations;Spinal Manipulations    PT Next Visit Plan Progress note; back interventions; posture    PT Home Exercise Plan Educated in seated Lev Scap and U/T stretches for HEP, as well as scap pinches and chin tucks.    Consulted and Agree with Plan of Care Patient           Patient will benefit from skilled therapeutic intervention in order to improve the following deficits and impairments:  Cardiopulmonary status limiting activity, Decreased activity tolerance, Decreased endurance, Decreased mobility, Decreased range of motion, Decreased strength, Difficulty walking, Dizziness, Hypomobility, Impaired flexibility, Impaired perceived functional ability, Increased muscle spasms, Impaired UE functional use, Impaired vision/preception, Obesity, Postural dysfunction, Improper body mechanics, Pain, Abnormal gait  Visit Diagnosis: Cervicalgia  Chronic low back pain, unspecified back pain laterality, unspecified whether sciatica present     Problem List Patient Active Problem List   Diagnosis Date Noted  . Acute bronchitis with bronchospasm 05/28/2020  . Diarrhea 04/11/2020  . Epigastric pain 02/19/2020  . Dark stools 02/19/2020  . PAF (paroxysmal atrial fibrillation) ([place]) 02/05/2019  . HPV (human papilloma virus) infection 12/01/2016  . Screening mammogram, encounter for 11/29/2016  . Estrogen deficiency 11/29/2016  . Encounter for routine gynecological examination 11/29/2016  . Grade III hemorrhoids 10/06/2016  . Tachycardia 08/14/2016  . Hemorrhoids 08/13/2016  . Atypical migraine 08/03/2016  . CAD (coronary artery disease)   . AAA  (abdominal aortic aneurysm) ([place])   . Chronic combined systolic (congestive) and diastolic (congestive) heart failure ([place])   . Prediabetes 02/11/2016  . Generalized anxiety disorder 12/08/2015  .  Panic attacks 07/28/2015  . Sensorineural hearing loss 03/27/2013  . Vitamin D deficiency 07/03/2012  . H/O aortic dissection 05/12/2012  . Class 2 obesity due to excess calories with body mass index (BMI) of 35.0 to 35.9 in adult 11/21/2011  . Obesity (BMI 30.0-34.9) 11/21/2011  . Routine general medical examination at a health care facility 09/20/2011  . S/P AVR (aortic valve replacement) 01/07/2011  . CORONARY ARTERY BYPASS GRAFT, HX OF 01/07/2011  . Arthritis 12/20/2010  . Back pain 12/15/2010  . H/O dissecting abdominal aortic aneurysm repair 08/24/2010  . IRRITABLE BOWEL SYNDROME 09/09/2009  . Obstructive sleep apnea 08/04/2009  . External hemorrhoid 06/26/2009  . Osteoporosis 01/09/2009  . Hyperlipidemia 07/26/2007  . Depression with anxiety 07/26/2007  . Essential hypertension 07/26/2007  . Allergic rhinitis 07/26/2007  . Asthma 07/26/2007  . GERD 07/26/2007  . Osteoarthritis 07/26/2007  . Ocular migraine 07/26/2007  . Mild intermittent asthma without complication [id number]    Phillips Grout PT, DPT, GCS  Abdiel Blackerby 06/25/2020, 4:52 PM  [place] MAIN [facility] SERVICES [address] [place], [place], [address] Phone: [phone]   Fax:  [phone]  Name: Rachel Torres MRN: [id number] Date of Birth: 10-20-1956

## 2020-06-25 NOTE — Telephone Encounter (Signed)
[name] had a few recent doxy acute appts but no f/u or CPE and no future appts. (except with our pharmacist), please advise

## 2020-06-26 MED ORDER — POTASSIUM CHLORIDE CRYS ER 10 MEQ PO TBCR: 10.0000 meq | EXTENDED_RELEASE_TABLET | Freq: Two times a day (BID) | ORAL | 3 refills | Status: DC | PRN

## 2020-06-26 MED ORDER — FUROSEMIDE 20 MG PO TABS
20.0000 mg | ORAL_TABLET | Freq: Every day | ORAL | 3 refills | Status: DC
Start: 2020-06-26 — End: 2021-05-20

## 2020-06-26 NOTE — Addendum Note (Signed)
Addended by: Valora Corporal on: 06/26/2020 04:34 PM   Modules accepted: Orders

## 2020-06-30 ENCOUNTER — Encounter (INDEPENDENT_AMBULATORY_CARE_PROVIDER_SITE_OTHER): Payer: Self-pay | Admitting: Family Medicine

## 2020-06-30 ENCOUNTER — Encounter (INDEPENDENT_AMBULATORY_CARE_PROVIDER_SITE_OTHER): Payer: Self-pay | Admitting: Bariatrics

## 2020-07-01 ENCOUNTER — Ambulatory Visit (INDEPENDENT_AMBULATORY_CARE_PROVIDER_SITE_OTHER): Payer: TRICARE For Life (TFL) | Admitting: Bariatrics

## 2020-07-01 ENCOUNTER — Ambulatory Visit (INDEPENDENT_AMBULATORY_CARE_PROVIDER_SITE_OTHER): Payer: Medicare HMO | Admitting: Family Medicine

## 2020-07-01 ENCOUNTER — Other Ambulatory Visit: Payer: Self-pay

## 2020-07-01 ENCOUNTER — Encounter (INDEPENDENT_AMBULATORY_CARE_PROVIDER_SITE_OTHER): Payer: Self-pay | Admitting: Family Medicine

## 2020-07-01 ENCOUNTER — Ambulatory Visit: Payer: Medicare HMO

## 2020-07-01 VITALS — BP 100/54 | HR 82 | Temp 97.7°F | Ht 65.0 in | Wt 200.0 lb

## 2020-07-01 DIAGNOSIS — R293 Abnormal posture: Secondary | ICD-10-CM | POA: Diagnosis not present

## 2020-07-01 DIAGNOSIS — M545 Low back pain, unspecified: Secondary | ICD-10-CM

## 2020-07-01 DIAGNOSIS — M546 Pain in thoracic spine: Secondary | ICD-10-CM

## 2020-07-01 DIAGNOSIS — M542 Cervicalgia: Secondary | ICD-10-CM | POA: Diagnosis not present

## 2020-07-01 DIAGNOSIS — Z6833 Body mass index (BMI) 33.0-33.9, adult: Secondary | ICD-10-CM

## 2020-07-01 DIAGNOSIS — G8929 Other chronic pain: Secondary | ICD-10-CM

## 2020-07-01 DIAGNOSIS — B372 Candidiasis of skin and nail: Secondary | ICD-10-CM

## 2020-07-01 DIAGNOSIS — E669 Obesity, unspecified: Secondary | ICD-10-CM | POA: Diagnosis not present

## 2020-07-01 MED ORDER — NYSTATIN 100000 UNIT/GM EX POWD: 1.0000 "application " | Freq: Two times a day (BID) | CUTANEOUS | 0 refills | Status: DC

## 2020-07-01 NOTE — Therapy (Signed)
[place] MAIN [facility] SERVICES [address] [place], [place], [address] Phone: [phone]   Fax:  [phone]  Physical Therapy Treatment Physical Therapy Progress Note   Dates of reporting period  05/13/20  to   07/01/20  Patient Details  Name: Rachel Torres MRN: [id number] Date of Birth: Nov 22, 1956 Referring Provider (PT): Loura Pardon A MD   Encounter Date: 07/01/2020   PT End of Session - 07/01/20 1349    Visit Number 10    Number of Visits 16    Date for PT Re-Evaluation 07/09/20    Authorization Type 10/10 eval 05/14/20; next session 1/10 07/01/20    Authorization Time Period back pain added to POC on 06/17/20    PT Start Time 1340    PT Stop Time 1426    PT Time Calculation (min) 46 min    Activity Tolerance Patient tolerated treatment well    Behavior During Therapy [facility] for tasks assessed/performed           Past Medical History:  Diagnosis Date  . AAA (abdominal aortic aneurysm) ([place])    a. Chronic w/o evidence of aneurysmal dil on CTA 01/2016.  Marland Kitchen Alcohol abuse   . Allergy   . Anemia   . Anxiety   . CAD (coronary artery disease)    a. 08/2010 s/p CABG x 1 (VG->RCA) @ time of Ao dissection repair; b. 01/2016 Lexiscan MV: mid antsept/apical defect w/ ? peri-infarct ischemia-->likely attenuation-->Med Rx.  . Chewing difficulty   . Chronic combined systolic (congestive) and diastolic (congestive) heart failure ([place])    a. 2012 EF 30-35%; b. 04/2015 EF 40-45%; c. 01/2016 Echo: Ef 55-65%, nl AoV bioprosthesis, nl RV, nl PASP.  Marland Kitchen Constipation   . Depression   . Edema of both lower extremities   . Gallbladder sludge   . GERD (gastroesophageal reflux disease)   . Heart failure ([place])   . Heart valve problem   . History of Bicuspid Aortic Valve    a. 08/2010 s/p AVR @ time of Ao dissection repair; b. 01/2016 Echo: Ef 55-65%, nl AoV bioprosthesis, nl RV, nl PASP.  Marland Kitchen Hx of repair of dissecting thoracic aortic aneurysm, [place] type A     a. 08/2010 s/p repair with AVR and VG->RCA; b. 01/2016 CTA: stable appearance of Asc Thoracic Aortic graft. Opacification of flase lumen of chronic abd Ao dissection w/ retrograde flow through lumbar arteries. No aneurysmal dil of flase lumen.  . Hyperlipidemia   . Hypertensive heart disease   . IBS (irritable bowel syndrome)   . Internal hemorrhoids   . Kidney problem   . Marfan syndrome    pt denies  . Migraine   . Obstructive sleep apnea   . Osteoarthritis   . Osteoporosis   . Palpitations   . Pneumonia   . RA (rheumatoid arthritis) ([place])    a. Followed by Dr. [name]    Past Surgical History:  Procedure Laterality Date  . ABDOMINAL AORTIC ANEURYSM REPAIR    . CARDIAC CATHETERIZATION  2001  . [place]  . CHOLECYSTECTOMY    . COLONOSCOPY    . ESOPHAGOGASTRODUODENOSCOPY (EGD) WITH PROPOFOL N/A 05/08/2020   Procedure: ESOPHAGOGASTRODUODENOSCOPY (EGD) WITH PROPOFOL;  Surgeon: Virgel Manifold, MD;  Location: [facility] ENDOSCOPY;  Service: Endoscopy;  Laterality: N/A;  . FRACTURE SURGERY Right    shoulder replacement  . HEMORRHOID BANDING    . ORIF DISTAL RADIUS FRACTURE Left   . UPPER GASTROINTESTINAL  ENDOSCOPY      There were no vitals filed for this visit.   Subjective Assessment - 07/01/20 1346    Subjective Patient presents with high stress from caregiver burdons and appointments. Tolerated dry needling well last session.    Pertinent History Patient has been seen by this therapist in the past for back related injuries/pain. Was last seen by this author in December of 2019. Since discharge patient has had COVID and an upper GI due to stomach issues. PMH includes aorta type A dissection (2011) with aortic valve replacement, bypass of R Coronary artery, hypertension, hyperlipidemia, CHF, pre-diabetes, AFIB, anxiety, depression, arthritis, GERD, atypical migraine, asthma, allergic rhinitis. 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 Does now have help  with aide.    Limitations Sitting;Reading;Lifting;Standing;Walking;Writing;Other (comment);House hold activities    How long can you sit comfortably? 20 mins before whole body discomfort    How long can you stand comfortably? 5 mins  or less: lower back    How long can you walk comfortably? 2 minutes    Patient Stated Goals to decrease pain and migraines    Currently in Pain? Yes    Pain Score 3     Pain Location --   upper/mid/low back   Pain Orientation Upper;Mid;Lower    Pain Descriptors / Indicators Aching    Pain Type Chronic pain;Neuropathic pain    Pain Onset More than a month ago    Pain Frequency Intermittent                   Goals:  VAS migraine: worst 7/10          Thoracic: worst 8/10         Lumbar: worst 7/10  FOTO: 38.7  DHI: 34 Cervical rotation  MODI:49 %   Treatment   Manual Prone:  CPAs and UPAs grade II-IV to thoracic and lumbar spine, 4x 10 seconds each level. Hypomobility throughout with reduction with repetition. T10 and L2 hypomobile  Inferior SI mobilization grade IIIL, R and central             ProneSTM with implementation of effleurage and pettrisage to thoracicand lumbar paraspinalsL and R; multiple adhesions throughout paraspinal musculaturewith pain relief reported.  Seated:  Trigger point/soft tissue release with movement to improve soft tissue limited muscle tissue length.60mnutes  Seated scapular retractions 20x  STM with focus on levator scap, upper trap, cervical paraspinals, multiple trigger points requiring release x829mutes   Patient's condition has the potential to improve in response to therapy. Maximum improvement is yet to be obtained. The anticipated improvement is attainable and reasonable in a generally predictable time.  Patient reports she is 25% back to her normal rate. Is more stressed today so feels that may be limiting her responses.                 PT  Education - 07/01/20 1348    Education provided Yes    Education Details goals, POC    Person(s) Educated Patient    Methods Explanation;Demonstration;Tactile cues;Verbal cues    Comprehension Verbalized understanding;Returned demonstration;Verbal cues required;Tactile cues required            PT Short Term Goals - 07/02/20 0808      PT SHORT TERM GOAL #1   Title Patient will be independent in home exercise program to improve strength/mobility and decrease pain for better functional independence with ADLs.    Baseline 5/26: HEP next session 7/14; HEP compliant  Time 2    Period Weeks    Status Partially Met    Target Date 05/28/20      PT SHORT TERM GOAL #2   Title Patient will report a worst pain of 7/10 on VAS in head to improve tolerance with ADLs and reduced symptoms with activities.    Baseline 5/26: 9/10 7/14: 7/10    Time 2    Period Weeks    Status Partially Met    Target Date 05/28/20      PT SHORT TERM GOAL #3   Title Patient will report a worst pain of 6/10 on VAS in thoracic and lumbar spine to improve tolerance with ADLs and reduced symptoms with activities.    Baseline 6/29: 8/10 pain 7/14: 8/10    Time 2    Period Weeks    Status On-going    Target Date 07/01/20             PT Long Term Goals - 07/02/20 0809      PT LONG TERM GOAL #1   Title Patient will report a worst pain of 3/10 on VAS in her head/migraines to improve tolerance with ADLs and reduced symptoms with activities    Baseline 5/26: 9/10 7/14: 7/10    Time 8    Period Weeks    Status Partially Met    Target Date 07/09/20      PT LONG TERM GOAL #2   Title Patient will increase FOTO score to equal to or greater than   45/100  to demonstrate statistically significant improvement in mobility and quality of life.    Baseline 5/26: 44 with risk adjusted 33/100 7/14: 38.7%    Time 8    Period Weeks    Status On-going    Target Date 07/09/20      PT LONG TERM GOAL #3   Title Patient  will reduce dizziness handicap inventory score to <30, for less dizziness with ADLs and increased safety with home and work tasks.    Baseline 5/26: 40 7/14: 34%    Time 8    Period Weeks    Status Partially Met    Target Date 07/09/20      PT LONG TERM GOAL #4   Title Patient will improve cervical rotation ROM to >/=60 degrees bilaterally to improve functional mobility and demonstrate improved muscle tissue length    Baseline 5/26: R 46 L 42 7/14: assess next session    Time 8    Period Weeks    Status New    Target Date 07/09/20      PT LONG TERM GOAL #5   Title Patient will report a worst pain of 3/10 on VAS in  thoracic and lumbar spine    to improve tolerance with ADLs and reduced symptoms with activities and caregiver duties    Baseline 6/29: 8/10 7/13: 8/10    Time 8    Period Weeks    Status On-going    Target Date 07/09/20      PT LONG TERM GOAL #6   Title Patient will tolerate sitting unsupported demonstrating erect sitting posture with minimal thoracic kyphosis for 15+ minutes with maximum of 5/10 back pain to demonstrate improved back extensor strength and improved sitting tolerance.    Baseline 6/29: painful and limited 7/14: can sit 15 minutes but has increasing forward head posture with prolonged hold    Time 8    Period Weeks    Status Partially Met  Target Date 07/09/20      PT LONG TERM GOAL #7   Title Patient will reduce modified Oswestry score to <20 as to demonstrate minimal disability with ADLs including improved sleeping tolerance, walking/sitting tolerance etc for better mobility with ADLs.    Baseline 6/29: 46% 7/13: 49%    Time 8    Period Weeks    Status Partially Met    Target Date 07/09/20                 Plan - 07/02/20 5809    Clinical Impression Statement Patient presents with high stress and anxiety due to caregiver duties with resultant decreased response in regards to FOTO score. Patient's pain levels are improved in cervical  spine/headache bu remain the same with thoracic region due to caregiver duties. [place] improved with decreased migraine. Patient's condition has the potential to improve in response to therapy. Maximum improvement is yet to be obtained. The anticipated improvement is attainable and reasonable in a generally predictable time. Patient will continue to benefit from skilled physical therapy to reduce pain, improve spinal mobility, and allow patient better quality of life.    Personal Factors and Comorbidities Age;Comorbidity 3+;Fitness;Finances;Past/Current Experience;Social Background;Time since onset of injury/illness/exacerbation;Other    Comorbidities aorta type A dissection (2011) with aortic valve replacement, bypass of R Coronary artery, hypertension, hyperlipidemia, CHF, pre-diabetes, AFIB, anxiety, depression, arthritis, GERD, atypical migraine, asthma, allergic rhinitis.    Examination-Activity Limitations [place];Caring for Others;Carry;Dressing;Stairs;Squat;Sit;Reach Overhead;Locomotion Level;Lift;Stand;Toileting;Transfers    Examination-Participation Restrictions Church;Cleaning;Community Activity;Interpersonal Relationship;Driving;Laundry;Volunteer;Shop;Personal Finances;Meal Prep;Medication Management;Yard Work    Product manager    Rehab Potential Fair    Clinical Impairments Affecting Rehab Potential + CAD testing, history of AAA, anemia, anxiety, asthma, CAD, chronic combined ystolic heart failure, GERD, Marfan syndrome, OA, RA (+) good work Psychologist, forensic, motivated, age     PT Frequency 2x / week    PT Duration 8 weeks    PT Treatment/Interventions ADLs/Self Care Home Management;Aquatic Therapy;Vestibular;Vasopneumatic Device;Taping;Passive range of motion;Dry needling;Energy conservation;Manual techniques;Therapeutic exercise;Balance training;Neuromuscular re-education;Patient/family education;Therapeutic activities;Functional mobility training;Stair  training;Gait training;Electrical Stimulation;Iontophoresis 7m/ml Dexamethasone;Moist Heat;Traction;Ultrasound;Cryotherapy;Canalith Repostioning;Joint Manipulations;Spinal Manipulations    PT Next Visit Plan Progress note; back interventions; posture    PT Home Exercise Plan Educated in seated Lev Scap and U/T stretches for HEP, as well as scap pinches and chin tucks.    Consulted and Agree with Plan of Care Patient           Patient will benefit from skilled therapeutic intervention in order to improve the following deficits and impairments:  Cardiopulmonary status limiting activity, Decreased activity tolerance, Decreased endurance, Decreased mobility, Decreased range of motion, Decreased strength, Difficulty walking, Dizziness, Hypomobility, Impaired flexibility, Impaired perceived functional ability, Increased muscle spasms, Impaired UE functional use, Impaired vision/preception, Obesity, Postural dysfunction, Improper body mechanics, Pain, Abnormal gait  Visit Diagnosis: Cervicalgia  Chronic low back pain, unspecified back pain laterality, unspecified whether sciatica present  Abnormal posture  Pain in thoracic spine     Problem List Patient Active Problem List   Diagnosis Date Noted  . Acute bronchitis with bronchospasm 05/28/2020  . Diarrhea 04/11/2020  . Epigastric pain 02/19/2020  . Dark stools 02/19/2020  . PAF (paroxysmal atrial fibrillation) ([place] 02/05/2019  . HPV (human papilloma virus) infection 12/01/2016  . Screening mammogram, encounter for 11/29/2016  . Estrogen deficiency 11/29/2016  . Encounter for routine gynecological examination 11/29/2016  . Grade III hemorrhoids 10/06/2016  . Tachycardia 08/14/2016  . Hemorrhoids 08/13/2016  . Atypical migraine 08/03/2016  .  CAD (coronary artery disease)   . AAA (abdominal aortic aneurysm) ([place])   . Chronic combined systolic (congestive) and diastolic (congestive) heart failure ([place])   . Prediabetes 02/11/2016  .  Generalized anxiety disorder 12/08/2015  . Panic attacks 07/28/2015  . Sensorineural hearing loss 03/27/2013  . Vitamin D deficiency 07/03/2012  . H/O aortic dissection 05/12/2012  . Class 2 obesity due to excess calories with body mass index (BMI) of 35.0 to 35.9 in adult 11/21/2011  . Obesity (BMI 30.0-34.9) 11/21/2011  . Routine general medical examination at a health care facility 09/20/2011  . S/P AVR (aortic valve replacement) 01/07/2011  . CORONARY ARTERY BYPASS GRAFT, HX OF 01/07/2011  . Arthritis 12/20/2010  . Back pain 12/15/2010  . H/O dissecting abdominal aortic aneurysm repair 08/24/2010  . IRRITABLE BOWEL SYNDROME 09/09/2009  . Obstructive sleep apnea 08/04/2009  . External hemorrhoid 06/26/2009  . Osteoporosis 01/09/2009  . Hyperlipidemia 07/26/2007  . Depression with anxiety 07/26/2007  . Essential hypertension 07/26/2007  . Allergic rhinitis 07/26/2007  . Asthma 07/26/2007  . GERD 07/26/2007  . Osteoarthritis 07/26/2007  . Ocular migraine 07/26/2007  . Mild intermittent asthma without complication 27/51/7001   Janna Arch, PT, DPT   07/02/2020, 8:15 AM  [place] MAIN [facility] SERVICES [address] [place], [place], [address] Phone: [phone]   Fax:  [phone]  Name: CENDY OCONNOR MRN: [id number] Date of Birth: December 12, 1956

## 2020-07-02 ENCOUNTER — Ambulatory Visit (INDEPENDENT_AMBULATORY_CARE_PROVIDER_SITE_OTHER): Payer: Medicare HMO | Admitting: Psychology

## 2020-07-02 ENCOUNTER — Ambulatory Visit: Payer: Medicare HMO

## 2020-07-02 ENCOUNTER — Ambulatory Visit (INDEPENDENT_AMBULATORY_CARE_PROVIDER_SITE_OTHER): Payer: TRICARE For Life (TFL) | Admitting: Bariatrics

## 2020-07-02 DIAGNOSIS — F4323 Adjustment disorder with mixed anxiety and depressed mood: Secondary | ICD-10-CM | POA: Diagnosis not present

## 2020-07-03 NOTE — Progress Notes (Signed)
Chief Complaint:   OBESITY Rachel Torres is here to discuss her progress with her obesity treatment plan along with follow-up of her obesity related diagnoses. Santina is on following a lower carbohydrate, vegetable and lean protein rich diet plan and states she is following her eating plan approximately 98% of the time. Sarabelle states she is doing physical therapy for 45 minutes 2 times per week.  Today's visit was #: 4 Starting weight: 213 lbs Starting date: 05/05/2020 Today's weight: 200 lbs Today's date: 07/01/2020 Total lbs lost to date: 13 Total lbs lost since last in-office visit: 1  Interim History: Rachel Torres continues to do well with weight loss. She is dealing with a lot of stress dealing with her husband's Parkinson's. She is doing a Lower carbohydrates plan, and she feels she does best with this.  Subjective:   1. Cutaneous candidiasis Rachel Torres has a history of cardiodiosis primarily located under her pannus. She has taken [place] in the past but it doesn't seem to clear all the work up and then flares back up.  Assessment/Plan:   1. Cutaneous candidiasis [place] agreed to start nystatin, apply BID for 2 weeks.  - nystatin (MYCOSTATIN/NYSTOP) powder; Apply 1 application topically 2 (two) times daily. Apply to affected area twice a day for 14 days  Dispense: 60 g; Refill: 0  2. Class 1 obesity with serious comorbidity and body mass index (BMI) of 33.0 to 33.9 in adult, unspecified obesity type Rachel Torres is currently in the action stage of change. As such, her goal is to continue with weight loss efforts. She has agreed to following a lower carbohydrate, vegetable and lean protein rich diet plan.   Exercise goals: As is.  Behavioral modification strategies: increasing lean protein intake.  Rachel Torres has agreed to follow-up with our clinic in 2 to 3 weeks. She was informed of the importance of frequent follow-up visits to maximize her success with intensive lifestyle modifications  for her multiple health conditions.   Objective:   Blood pressure (!) 100/54, pulse 82, temperature 97.7 F (36.5 C), temperature source Oral, height 5\' 5"  (1.651 m), weight 200 lb (90.7 kg), SpO2 96 %. Body mass index is 33.28 kg/m.  General: Cooperative, alert, well developed, in no acute distress. HEENT: Conjunctivae and lids unremarkable. Cardiovascular: Regular rhythm.  Lungs: Normal work of breathing. Neurologic: No focal deficits.   Lab Results  Component Value Date   CREATININE 0.91 02/19/2020   BUN 12 02/19/2020   NA 140 02/19/2020   K 4.1 02/19/2020   CL 102 02/19/2020   CO2 31 02/19/2020   Lab Results  Component Value Date   ALT 23 02/19/2020   AST 22 02/19/2020   ALKPHOS 67 02/19/2020   BILITOT 0.6 02/19/2020   Lab Results  Component Value Date   HGBA1C 5.9 (H) 05/05/2020   HGBA1C 6.0 06/07/2019   HGBA1C 6.1 06/26/2018   HGBA1C 6.0 11/24/2017   HGBA1C 5.5 11/23/2016   Lab Results  Component Value Date   INSULIN 25.2 (H) 05/05/2020   Lab Results  Component Value Date   TSH 1.040 05/05/2020   Lab Results  Component Value Date   CHOL 195 05/05/2020   HDL 56 05/05/2020   LDLCALC 116 (H) 05/05/2020   LDLDIRECT 189.4 10/13/2011   TRIG 133 05/05/2020   CHOLHDL 3 06/07/2019   Lab Results  Component Value Date   WBC 5.8 02/19/2020   HGB 13.0 02/19/2020   HCT 38.5 02/19/2020   MCV 95.4 02/19/2020   PLT  284.0 02/19/2020   Lab Results  Component Value Date   FERRITIN 105.0 12/11/2010   Attestation Statements:   Reviewed by clinician on day of visit: allergies, medications, problem list, medical history, surgical history, family history, social history, and previous encounter notes.   I, Trixie Dredge, am acting as transcriptionist for [name], MD.  I have reviewed the above documentation for accuracy and completeness, and I agree with the above. -  [name], MD

## 2020-07-08 ENCOUNTER — Ambulatory Visit: Payer: Medicare HMO

## 2020-07-08 ENCOUNTER — Other Ambulatory Visit: Payer: Self-pay

## 2020-07-08 DIAGNOSIS — G8929 Other chronic pain: Secondary | ICD-10-CM

## 2020-07-08 DIAGNOSIS — M546 Pain in thoracic spine: Secondary | ICD-10-CM

## 2020-07-08 DIAGNOSIS — M542 Cervicalgia: Secondary | ICD-10-CM | POA: Diagnosis not present

## 2020-07-08 DIAGNOSIS — M545 Low back pain, unspecified: Secondary | ICD-10-CM

## 2020-07-08 DIAGNOSIS — R293 Abnormal posture: Secondary | ICD-10-CM | POA: Diagnosis not present

## 2020-07-08 NOTE — Therapy (Signed)
[place] MAIN [facility] SERVICES [address] [place], [place], [address] Phone: [phone]   Fax:  [phone]  Physical Therapy Treatment/ RECERT   Patient Details  Name: Rachel Torres MRN: [id number] Date of Birth: Dec 22, 1955 Referring Provider (PT): Glori Bickers, Wynelle Fanny MD   Encounter Date: 07/08/2020   PT End of Session - 07/09/20 1247    Visit Number 11    Number of Visits 28    Date for PT Re-Evaluation 09/02/20    Authorization Type 1/10 07/01/20    Authorization Time Period back pain added to POC on 06/17/20    PT Start Time 1351    PT Stop Time 1430    PT Time Calculation (min) 39 min    Activity Tolerance Patient tolerated treatment well    Behavior During Therapy [facility] for tasks assessed/performed           Past Medical History:  Diagnosis Date  . AAA (abdominal aortic aneurysm) ([place])    a. Chronic w/o evidence of aneurysmal dil on CTA 01/2016.  Marland Kitchen Alcohol abuse   . Allergy   . Anemia   . Anxiety   . CAD (coronary artery disease)    a. 08/2010 s/p CABG x 1 (VG->RCA) @ time of Ao dissection repair; b. 01/2016 Lexiscan MV: mid antsept/apical defect w/ ? peri-infarct ischemia-->likely attenuation-->Med Rx.  . Chewing difficulty   . Chronic combined systolic (congestive) and diastolic (congestive) heart failure ([place])    a. 2012 EF 30-35%; b. 04/2015 EF 40-45%; c. 01/2016 Echo: Ef 55-65%, nl AoV bioprosthesis, nl RV, nl PASP.  Marland Kitchen Constipation   . Depression   . Edema of both lower extremities   . Gallbladder sludge   . GERD (gastroesophageal reflux disease)   . Heart failure ([place])   . Heart valve problem   . History of Bicuspid Aortic Valve    a. 08/2010 s/p AVR @ time of Ao dissection repair; b. 01/2016 Echo: Ef 55-65%, nl AoV bioprosthesis, nl RV, nl PASP.  Marland Kitchen Hx of repair of dissecting thoracic aortic aneurysm, [place] type A    a. 08/2010 s/p repair with AVR and VG->RCA; b. 01/2016 CTA: stable appearance of Asc Thoracic Aortic  graft. Opacification of flase lumen of chronic abd Ao dissection w/ retrograde flow through lumbar arteries. No aneurysmal dil of flase lumen.  . Hyperlipidemia   . Hypertensive heart disease   . IBS (irritable bowel syndrome)   . Internal hemorrhoids   . Kidney problem   . Marfan syndrome    pt denies  . Migraine   . Obstructive sleep apnea   . Osteoarthritis   . Osteoporosis   . Palpitations   . Pneumonia   . RA (rheumatoid arthritis) ([place])    a. Followed by Dr. [name]    Past Surgical History:  Procedure Laterality Date  . ABDOMINAL AORTIC ANEURYSM REPAIR    . CARDIAC CATHETERIZATION  2001  . [place]  . CHOLECYSTECTOMY    . COLONOSCOPY    . ESOPHAGOGASTRODUODENOSCOPY (EGD) WITH PROPOFOL N/A 05/08/2020   Procedure: ESOPHAGOGASTRODUODENOSCOPY (EGD) WITH PROPOFOL;  Surgeon: Virgel Manifold, MD;  Location: [facility] ENDOSCOPY;  Service: Endoscopy;  Laterality: N/A;  . FRACTURE SURGERY Right    shoulder replacement  . HEMORRHOID BANDING    . ORIF DISTAL RADIUS FRACTURE Left   . UPPER GASTROINTESTINAL ENDOSCOPY      There were no vitals filed for this visit.   Subjective Assessment - 07/09/20  1238    Subjective Patient hurt herself over the weekend caring for her husband after an episode of incontinance. Feels like PT has helped but caregiver duties is slowing progress.    Pertinent History Patient has been seen by this therapist in the past for back related injuries/pain. Was last seen by this author in December of 2019. Since discharge patient has had COVID and an upper GI due to stomach issues. PMH includes aorta type A dissection (2011) with aortic valve replacement, bypass of R Coronary artery, hypertension, hyperlipidemia, CHF, pre-diabetes, AFIB, anxiety, depression, arthritis, GERD, atypical migraine, asthma, allergic rhinitis. 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 Does now have help with aide.    Limitations  Sitting;Reading;Lifting;Standing;Walking;Writing;Other (comment);House hold activities    How long can you sit comfortably? 20 mins before whole body discomfort    How long can you stand comfortably? 5 mins  or less: lower back    How long can you walk comfortably? 2 minutes    Patient Stated Goals to decrease pain and migraines    Currently in Pain? Yes    Pain Score 4     Pain Location Back    Pain Orientation Upper;Mid;Lower    Pain Descriptors / Indicators Aching    Pain Type Chronic pain    Pain Onset More than a month ago    Pain Frequency Intermittent    Aggravating Factors  tension, stress, prolonged sitting, caregiver duties    Pain Relieving Factors pain medication, stretching    Effect of Pain on Daily Activities limits QOL             Patient's husband had an episode of inconstancies hurting her back to clean it.        Treatment:   Manual Prone:   CPAs and UPAs grade II-IV to thoracic and lumbar spine, 4x 10 seconds each level.Hypomobility throughout with reduction with repetition. T10 and L2 hypomobile  Inferior SI mobilization grade III each side and central  ProneSTM with roller to thoracicand lumbar paraspinalsL and R; multiple adhesions throughout paraspinal musculaturewith pain relief reported.    Supine LE rotation 60 seconds  Piriformis stretch 2x 30 second hold each LE  Nerve glides via SLR with LE on PT shoulder 20x   Seated:  STM with trigger point release to upper trap, levator scap, cervical parapsinals x 9 minutes  TherEx Posterior pelvic tilt 15 x 5 second holds GTB abduction 10x 3 second holds ; cues for keeping pelvis posteriorly rotated swiss ball TrA contraction between knees and arms 10x 10 second holds  [place] press pectoral stretch 2x 30 seconds      Pt educated throughout session about proper posture and technique with  exercises. Improved exercise technique, movement at target joints, use of target muscles after min to mod verbal, visual, tactile cues.   Goals performed 07/01/20, please refer to this note for further details. Patient's pain is higher than normal this session due to caregiver duties, however was reduced through session with manual and therex to 1/10 by end of session. Postural mechanics are limited by muscle fatigue and multiple muscle adhesions. Patient will benefit from skilled physical therapy to decrease pain, improve posture, and improve quality of life.                  PT Education - 07/09/20 1246    Education provided Yes    Education Details exercise technique, body mechanics POC    Person(s) Educated Patient  Methods Explanation;Demonstration;Tactile cues;Verbal cues    Comprehension Verbalized understanding;Returned demonstration;Verbal cues required;Tactile cues required            PT Short Term Goals - 07/09/20 1252      PT SHORT TERM GOAL #1   Title Patient will be independent in home exercise program to improve strength/mobility and decrease pain for better functional independence with ADLs.    Baseline 5/26: HEP next session 7/14; HEP compliant    Time 2    Period Weeks    Status Achieved    Target Date 05/28/20      PT SHORT TERM GOAL #2   Title Patient will report a worst pain of 7/10 on VAS in head to improve tolerance with ADLs and reduced symptoms with activities.    Baseline 5/26: 9/10 7/14: 7/10    Time 2    Period Weeks    Status Achieved    Target Date 05/28/20      PT SHORT TERM GOAL #3   Title Patient will report a worst pain of 6/10 on VAS in thoracic and lumbar spine to improve tolerance with ADLs and reduced symptoms with activities.    Baseline 6/29: 8/10 pain 7/14: 8/10    Time 2    Period Weeks    Status On-going    Target Date 07/23/20             PT Long Term Goals - 07/09/20 1252      PT LONG TERM GOAL #1   Title  Patient will report a worst pain of 3/10 on VAS in her head/migraines to improve tolerance with ADLs and reduced symptoms with activities    Baseline 5/26: 9/10 7/14: 7/10    Time 8    Period Weeks    Status Partially Met    Target Date 09/02/20      PT LONG TERM GOAL #2   Title Patient will increase FOTO score to equal to or greater than   45/100  to demonstrate statistically significant improvement in mobility and quality of life.    Baseline 5/26: 44 with risk adjusted 33/100 7/14: 38.7%    Time 8    Period Weeks    Status On-going    Target Date 09/02/20      PT LONG TERM GOAL #3   Title Patient will reduce dizziness handicap inventory score to <30, for less dizziness with ADLs and increased safety with home and work tasks.    Baseline 5/26: 40 7/14: 34%    Time 8    Period Weeks    Status Partially Met    Target Date 09/02/20      PT LONG TERM GOAL #4   Title Patient will improve cervical rotation ROM to >/=60 degrees bilaterally to improve functional mobility and demonstrate improved muscle tissue length    Baseline 5/26: R 46 L 42 7/14: assess next session 7/21: R 51 L 49    Time 8    Period Weeks    Status Partially Met    Target Date 09/02/20      PT LONG TERM GOAL #5   Title Patient will report a worst pain of 3/10 on VAS in  thoracic and lumbar spine    to improve tolerance with ADLs and reduced symptoms with activities and caregiver duties    Baseline 6/29: 8/10 7/13: 8/10    Time 8    Period Weeks    Status On-going    Target Date 09/02/20  PT LONG TERM GOAL #6   Title Patient will tolerate sitting unsupported demonstrating erect sitting posture with minimal thoracic kyphosis for 15+ minutes with maximum of 5/10 back pain to demonstrate improved back extensor strength and improved sitting tolerance.    Baseline 6/29: painful and limited 7/14: can sit 15 minutes but has increasing forward head posture with prolonged hold    Time 8    Period Weeks    Status  Partially Met    Target Date 09/02/20      PT LONG TERM GOAL #7   Title Patient will reduce modified Oswestry score to <20 as to demonstrate minimal disability with ADLs including improved sleeping tolerance, walking/sitting tolerance etc for better mobility with ADLs.    Baseline 6/29: 46% 7/13: 49%    Time 8    Period Weeks    Status Partially Met    Target Date 09/02/20                 Plan - 07/09/20 1251    Clinical Impression Statement Goals performed 07/01/20, please refer to this note for further details. Patient's pain is higher than normal this session due to caregiver duties, however was reduced through session with manual and therex to 1/10 by end of session. Postural mechanics are limited by muscle fatigue and multiple muscle adhesions. Patient will benefit from skilled physical therapy to decrease pain, improve posture, and improve quality of life.    Personal Factors and Comorbidities Age;Comorbidity 3+;Fitness;Finances;Past/Current Experience;Social Background;Time since onset of injury/illness/exacerbation;Other    Comorbidities aorta type A dissection (2011) with aortic valve replacement, bypass of R Coronary artery, hypertension, hyperlipidemia, CHF, pre-diabetes, AFIB, anxiety, depression, arthritis, GERD, atypical migraine, asthma, allergic rhinitis.    Examination-Activity Limitations Bend;Caring for Others;Carry;Dressing;Stairs;Squat;Sit;Reach Overhead;Locomotion Level;Lift;Stand;Toileting;Transfers    Examination-Participation Restrictions Church;Cleaning;Community Activity;Interpersonal Relationship;Driving;Laundry;Volunteer;Shop;Personal Finances;Meal Prep;Medication Management;Yard Work    Product manager    Rehab Potential Fair    Clinical Impairments Affecting Rehab Potential + CAD testing, history of AAA, anemia, anxiety, asthma, CAD, chronic combined ystolic heart failure, GERD, Marfan syndrome, OA, RA (+) good  work Psychologist, forensic, motivated, age     PT Frequency 2x / week    PT Duration 8 weeks    PT Treatment/Interventions ADLs/Self Care Home Management;Aquatic Therapy;Vestibular;Vasopneumatic Device;Taping;Passive range of motion;Dry needling;Energy conservation;Manual techniques;Therapeutic exercise;Balance training;Neuromuscular re-education;Patient/family education;Therapeutic activities;Functional mobility training;Stair training;Gait training;Electrical Stimulation;Iontophoresis 3m/ml Dexamethasone;Moist Heat;Traction;Ultrasound;Cryotherapy;Canalith Repostioning;Joint Manipulations;Spinal Manipulations    PT Next Visit Plan back interventions; posture    PT Home Exercise Plan Educated in seated Lev Scap and U/T stretches for HEP, as well as scap pinches and chin tucks.    Consulted and Agree with Plan of Care Patient           Patient will benefit from skilled therapeutic intervention in order to improve the following deficits and impairments:  Cardiopulmonary status limiting activity, Decreased activity tolerance, Decreased endurance, Decreased mobility, Decreased range of motion, Decreased strength, Difficulty walking, Dizziness, Hypomobility, Impaired flexibility, Impaired perceived functional ability, Increased muscle spasms, Impaired UE functional use, Impaired vision/preception, Obesity, Postural dysfunction, Improper body mechanics, Pain, Abnormal gait  Visit Diagnosis: Cervicalgia  Chronic low back pain, unspecified back pain laterality, unspecified whether sciatica present  Abnormal posture  Pain in thoracic spine     Problem List Patient Active Problem List   Diagnosis Date Noted  . Acute bronchitis with bronchospasm 05/28/2020  . Diarrhea 04/11/2020  . Epigastric pain 02/19/2020  . Dark stools 02/19/2020  . PAF (paroxysmal atrial fibrillation) ([place]  02/05/2019  . HPV (human papilloma virus) infection 12/01/2016  . Screening mammogram, encounter for 11/29/2016  . Estrogen  deficiency 11/29/2016  . Encounter for routine gynecological examination 11/29/2016  . Grade III hemorrhoids 10/06/2016  . Tachycardia 08/14/2016  . Hemorrhoids 08/13/2016  . Atypical migraine 08/03/2016  . CAD (coronary artery disease)   . AAA (abdominal aortic aneurysm) ([place])   . Chronic combined systolic (congestive) and diastolic (congestive) heart failure ([place])   . Prediabetes 02/11/2016  . Generalized anxiety disorder 12/08/2015  . Panic attacks 07/28/2015  . Sensorineural hearing loss 03/27/2013  . Vitamin D deficiency 07/03/2012  . H/O aortic dissection 05/12/2012  . Class 2 obesity due to excess calories with body mass index (BMI) of 35.0 to 35.9 in adult 11/21/2011  . Obesity (BMI 30.0-34.9) 11/21/2011  . Routine general medical examination at a health care facility 09/20/2011  . S/P AVR (aortic valve replacement) 01/07/2011  . CORONARY ARTERY BYPASS GRAFT, HX OF 01/07/2011  . Arthritis 12/20/2010  . Back pain 12/15/2010  . H/O dissecting abdominal aortic aneurysm repair 08/24/2010  . IRRITABLE BOWEL SYNDROME 09/09/2009  . Obstructive sleep apnea 08/04/2009  . External hemorrhoid 06/26/2009  . Osteoporosis 01/09/2009  . Hyperlipidemia 07/26/2007  . Depression with anxiety 07/26/2007  . Essential hypertension 07/26/2007  . Allergic rhinitis 07/26/2007  . Asthma 07/26/2007  . GERD 07/26/2007  . Osteoarthritis 07/26/2007  . Ocular migraine 07/26/2007  . Mild intermittent asthma without complication 24/08/7352   Janna Arch, PT, DPT   07/09/2020, 12:56 PM  [place] MAIN [facility] SERVICES [address] [place], [place], [address] Phone: [phone]   Fax:  [phone]  Name: AAISHA SLITER MRN: [id number] Date of Birth: 29-Sep-1956

## 2020-07-11 ENCOUNTER — Ambulatory Visit: Payer: Medicare HMO

## 2020-07-11 ENCOUNTER — Other Ambulatory Visit: Payer: Self-pay

## 2020-07-11 DIAGNOSIS — M546 Pain in thoracic spine: Secondary | ICD-10-CM

## 2020-07-11 DIAGNOSIS — R293 Abnormal posture: Secondary | ICD-10-CM

## 2020-07-11 DIAGNOSIS — M542 Cervicalgia: Secondary | ICD-10-CM

## 2020-07-11 DIAGNOSIS — M545 Low back pain, unspecified: Secondary | ICD-10-CM

## 2020-07-11 DIAGNOSIS — G8929 Other chronic pain: Secondary | ICD-10-CM

## 2020-07-11 NOTE — Therapy (Signed)
[place] MAIN [facility] SERVICES [address] [place], [place], [address] Phone: [phone]   Fax:  [phone]  Physical Therapy Treatment  Patient Details  Name: Rachel Torres MRN: [id number] Date of Birth: 03-Nov-1956 Referring Provider (PT): Glori Bickers, Wynelle Fanny MD   Encounter Date: 07/11/2020   PT End of Session - 07/11/20 1140    Visit Number 12    Number of Visits 28    Date for PT Re-Evaluation 09/02/20    Authorization Type 2/10 07/01/20    Authorization Time Period back pain added to POC on 06/17/20    PT Start Time 1030    PT Stop Time 1114    PT Time Calculation (min) 44 min    Activity Tolerance Patient tolerated treatment well    Behavior During Therapy [facility] for tasks assessed/performed           Past Medical History:  Diagnosis Date  . AAA (abdominal aortic aneurysm) ([place])    a. Chronic w/o evidence of aneurysmal dil on CTA 01/2016.  Marland Kitchen Alcohol abuse   . Allergy   . Anemia   . Anxiety   . CAD (coronary artery disease)    a. 08/2010 s/p CABG x 1 (VG->RCA) @ time of Ao dissection repair; b. 01/2016 Lexiscan MV: mid antsept/apical defect w/ ? peri-infarct ischemia-->likely attenuation-->Med Rx.  . Chewing difficulty   . Chronic combined systolic (congestive) and diastolic (congestive) heart failure ([place])    a. 2012 EF 30-35%; b. 04/2015 EF 40-45%; c. 01/2016 Echo: Ef 55-65%, nl AoV bioprosthesis, nl RV, nl PASP.  Marland Kitchen Constipation   . Depression   . Edema of both lower extremities   . Gallbladder sludge   . GERD (gastroesophageal reflux disease)   . Heart failure ([place])   . Heart valve problem   . History of Bicuspid Aortic Valve    a. 08/2010 s/p AVR @ time of Ao dissection repair; b. 01/2016 Echo: Ef 55-65%, nl AoV bioprosthesis, nl RV, nl PASP.  Marland Kitchen Hx of repair of dissecting thoracic aortic aneurysm, [place] type A    a. 08/2010 s/p repair with AVR and VG->RCA; b. 01/2016 CTA: stable appearance of Asc Thoracic Aortic graft.  Opacification of flase lumen of chronic abd Ao dissection w/ retrograde flow through lumbar arteries. No aneurysmal dil of flase lumen.  . Hyperlipidemia   . Hypertensive heart disease   . IBS (irritable bowel syndrome)   . Internal hemorrhoids   . Kidney problem   . Marfan syndrome    pt denies  . Migraine   . Obstructive sleep apnea   . Osteoarthritis   . Osteoporosis   . Palpitations   . Pneumonia   . RA (rheumatoid arthritis) ([place])    a. Followed by Dr. [name]    Past Surgical History:  Procedure Laterality Date  . ABDOMINAL AORTIC ANEURYSM REPAIR    . CARDIAC CATHETERIZATION  2001  . [place]  . CHOLECYSTECTOMY    . COLONOSCOPY    . ESOPHAGOGASTRODUODENOSCOPY (EGD) WITH PROPOFOL N/A 05/08/2020   Procedure: ESOPHAGOGASTRODUODENOSCOPY (EGD) WITH PROPOFOL;  Surgeon: Virgel Manifold, MD;  Location: [facility] ENDOSCOPY;  Service: Endoscopy;  Laterality: N/A;  . FRACTURE SURGERY Right    shoulder replacement  . HEMORRHOID BANDING    . ORIF DISTAL RADIUS FRACTURE Left   . UPPER GASTROINTESTINAL ENDOSCOPY      There were no vitals filed for this visit.   Subjective Assessment - 07/11/20 1129  Subjective Patient bent over to pick up dirty clothes and felt a sharp pain on right side low back, tender and painful, did ice and heat with some relief. 5/10    Pertinent History Patient has been seen by this therapist in the past for back related injuries/pain. Was last seen by this author in December of 2019. Since discharge patient has had COVID and an upper GI due to stomach issues. PMH includes aorta type A dissection (2011) with aortic valve replacement, bypass of R Coronary artery, hypertension, hyperlipidemia, CHF, pre-diabetes, AFIB, anxiety, depression, arthritis, GERD, atypical migraine, asthma, allergic rhinitis. 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 Does now have help with aide.    Limitations Sitting;Reading;Lifting;Standing;Walking;Writing;Other  (comment);House hold activities    How long can you sit comfortably? 20 mins before whole body discomfort    How long can you stand comfortably? 5 mins  or less: lower back    How long can you walk comfortably? 2 minutes    Patient Stated Goals to decrease pain and migraines    Currently in Pain? Yes    Pain Score 5     Pain Location Back    Pain Orientation Right;Lower    Pain Descriptors / Indicators Aching    Pain Type Chronic pain    Pain Onset More than a month ago    Pain Frequency Intermittent              Patient bent over to pick up dirty clothes and felt a sharp pain on right side low back, tender and painful, did ice and heat with some relief. 5/10        Treatment:    Manual Prone:              CPAs and UPAs grade II-IV to thoracic and lumbar spine, 4x 10 seconds each level. Hypomobility throughout with reduction with repetition. T10 and L2 hypomobile               Prone STM with implementation of effleurage and pettrisage to thoracic and lumbar paraspinals L and R; multiple adhesions throughout paraspinal musculature with pain relief reported.    Supine             Figure 4 with distraction belt 10x 4 second inferior glides.          hamstring lengthening with popliteal angle for low back pain reduction    Seated:  Trigger point/soft tissue release with movement to improve soft tissue limited muscle tissue length. 6 minutes   Seated scapular retractions 20x    STM with focus on levator scap, upper trap, cervical paraspinals, multiple trigger points requiring release x 8 minutes    TherEx             Posterior pelvic tilt 15 x 5 second holds              LE rotation 60 seconds focus on slow controlled lengthening   Single knee to chest 10x 5 second hold   Prone single arm row 15x each UE  Child pose 2x30 seconds   Pt educated throughout session about proper posture and technique with exercises. Improved exercise technique, movement at target joints, use  of target muscles after min to mod verbal, visual, tactile cues   Patient educated on gentle stretching and use of ice for reduction of acute exacerbation of muscle tissue injury.  PT Education - 07/11/20 1139    Education provided Yes    Education Details use of ice, gentle stretching    Person(s) Educated Patient    Methods Explanation;Demonstration;Tactile cues;Verbal cues    Comprehension Verbalized understanding;Returned demonstration;Verbal cues required;Tactile cues required            PT Short Term Goals - 07/09/20 1252      PT SHORT TERM GOAL #1   Title Patient will be independent in home exercise program to improve strength/mobility and decrease pain for better functional independence with ADLs.    Baseline 5/26: HEP next session 7/14; HEP compliant    Time 2    Period Weeks    Status Achieved    Target Date 05/28/20      PT SHORT TERM GOAL #2   Title Patient will report a worst pain of 7/10 on VAS in head to improve tolerance with ADLs and reduced symptoms with activities.    Baseline 5/26: 9/10 7/14: 7/10    Time 2    Period Weeks    Status Achieved    Target Date 05/28/20      PT SHORT TERM GOAL #3   Title Patient will report a worst pain of 6/10 on VAS in thoracic and lumbar spine to improve tolerance with ADLs and reduced symptoms with activities.    Baseline 6/29: 8/10 pain 7/14: 8/10    Time 2    Period Weeks    Status On-going    Target Date 07/23/20             PT Long Term Goals - 07/09/20 1252      PT LONG TERM GOAL #1   Title Patient will report a worst pain of 3/10 on VAS in her head/migraines to improve tolerance with ADLs and reduced symptoms with activities    Baseline 5/26: 9/10 7/14: 7/10    Time 8    Period Weeks    Status Partially Met    Target Date 09/02/20      PT LONG TERM GOAL #2   Title Patient will increase FOTO score to equal to or greater than   45/100  to demonstrate statistically  significant improvement in mobility and quality of life.    Baseline 5/26: 44 with risk adjusted 33/100 7/14: 38.7%    Time 8    Period Weeks    Status On-going    Target Date 09/02/20      PT LONG TERM GOAL #3   Title Patient will reduce dizziness handicap inventory score to <30, for less dizziness with ADLs and increased safety with home and work tasks.    Baseline 5/26: 40 7/14: 34%    Time 8    Period Weeks    Status Partially Met    Target Date 09/02/20      PT LONG TERM GOAL #4   Title Patient will improve cervical rotation ROM to >/=60 degrees bilaterally to improve functional mobility and demonstrate improved muscle tissue length    Baseline 5/26: R 46 L 42 7/14: assess next session 7/21: R 51 L 49    Time 8    Period Weeks    Status Partially Met    Target Date 09/02/20      PT LONG TERM GOAL #5   Title Patient will report a worst pain of 3/10 on VAS in  thoracic and lumbar spine    to improve tolerance with ADLs and reduced symptoms with activities and caregiver  duties    Baseline 6/29: 8/10 7/13: 8/10    Time 8    Period Weeks    Status On-going    Target Date 09/02/20      PT LONG TERM GOAL #6   Title Patient will tolerate sitting unsupported demonstrating erect sitting posture with minimal thoracic kyphosis for 15+ minutes with maximum of 5/10 back pain to demonstrate improved back extensor strength and improved sitting tolerance.    Baseline 6/29: painful and limited 7/14: can sit 15 minutes but has increasing forward head posture with prolonged hold    Time 8    Period Weeks    Status Partially Met    Target Date 09/02/20      PT LONG TERM GOAL #7   Title Patient will reduce modified Oswestry score to <20 as to demonstrate minimal disability with ADLs including improved sleeping tolerance, walking/sitting tolerance etc for better mobility with ADLs.    Baseline 6/29: 46% 7/13: 49%    Time 8    Period Weeks    Status Partially Met    Target Date 09/02/20                  Plan - 07/11/20 1144    Clinical Impression Statement Patient challenged this session due to aggravation of right lower quad musculature. She required educated on icing, gentle stretching, and rest for pain reduction. Patient introduced to single arm postural exercise for reduction of irritation to lipoma. Patient will benefit from skilled physical therapy to decrease pain, improve posture, and improve quality of life.    Personal Factors and Comorbidities Age;Comorbidity 3+;Fitness;Finances;Past/Current Experience;Social Background;Time since onset of injury/illness/exacerbation;Other    Comorbidities aorta type A dissection (2011) with aortic valve replacement, bypass of R Coronary artery, hypertension, hyperlipidemia, CHF, pre-diabetes, AFIB, anxiety, depression, arthritis, GERD, atypical migraine, asthma, allergic rhinitis.    Examination-Activity Limitations Bend;Caring for Others;Carry;Dressing;Stairs;Squat;Sit;Reach Overhead;Locomotion Level;Lift;Stand;Toileting;Transfers    Examination-Participation Restrictions Church;Cleaning;Community Activity;Interpersonal Relationship;Driving;Laundry;Volunteer;Shop;Personal Finances;Meal Prep;Medication Management;Yard Work    Product manager    Rehab Potential Fair    Clinical Impairments Affecting Rehab Potential + CAD testing, history of AAA, anemia, anxiety, asthma, CAD, chronic combined ystolic heart failure, GERD, Marfan syndrome, OA, RA (+) good work Psychologist, forensic, motivated, age     PT Frequency 2x / week    PT Duration 8 weeks    PT Treatment/Interventions ADLs/Self Care Home Management;Aquatic Therapy;Vestibular;Vasopneumatic Device;Taping;Passive range of motion;Dry needling;Energy conservation;Manual techniques;Therapeutic exercise;Balance training;Neuromuscular re-education;Patient/family education;Therapeutic activities;Functional mobility training;Stair training;Gait  training;Electrical Stimulation;Iontophoresis 62m/ml Dexamethasone;Moist Heat;Traction;Ultrasound;Cryotherapy;Canalith Repostioning;Joint Manipulations;Spinal Manipulations    PT Next Visit Plan back interventions; posture    PT Home Exercise Plan Educated in seated Lev Scap and U/T stretches for HEP, as well as scap pinches and chin tucks.    Consulted and Agree with Plan of Care Patient           Patient will benefit from skilled therapeutic intervention in order to improve the following deficits and impairments:  Cardiopulmonary status limiting activity, Decreased activity tolerance, Decreased endurance, Decreased mobility, Decreased range of motion, Decreased strength, Difficulty walking, Dizziness, Hypomobility, Impaired flexibility, Impaired perceived functional ability, Increased muscle spasms, Impaired UE functional use, Impaired vision/preception, Obesity, Postural dysfunction, Improper body mechanics, Pain, Abnormal gait  Visit Diagnosis: Cervicalgia  Chronic low back pain, unspecified back pain laterality, unspecified whether sciatica present  Abnormal posture  Pain in thoracic spine     Problem List Patient Active Problem List   Diagnosis Date Noted  . Acute bronchitis with  bronchospasm 05/28/2020  . Diarrhea 04/11/2020  . Epigastric pain 02/19/2020  . Dark stools 02/19/2020  . PAF (paroxysmal atrial fibrillation) ([place]) 02/05/2019  . HPV (human papilloma virus) infection 12/01/2016  . Screening mammogram, encounter for 11/29/2016  . Estrogen deficiency 11/29/2016  . Encounter for routine gynecological examination 11/29/2016  . Grade III hemorrhoids 10/06/2016  . Tachycardia 08/14/2016  . Hemorrhoids 08/13/2016  . Atypical migraine 08/03/2016  . CAD (coronary artery disease)   . AAA (abdominal aortic aneurysm) ([place])   . Chronic combined systolic (congestive) and diastolic (congestive) heart failure ([place])   . Prediabetes 02/11/2016  . Generalized anxiety disorder  12/08/2015  . Panic attacks 07/28/2015  . Sensorineural hearing loss 03/27/2013  . Vitamin D deficiency 07/03/2012  . H/O aortic dissection 05/12/2012  . Class 2 obesity due to excess calories with body mass index (BMI) of 35.0 to 35.9 in adult 11/21/2011  . Obesity (BMI 30.0-34.9) 11/21/2011  . Routine general medical examination at a health care facility 09/20/2011  . S/P AVR (aortic valve replacement) 01/07/2011  . CORONARY ARTERY BYPASS GRAFT, HX OF 01/07/2011  . Arthritis 12/20/2010  . Back pain 12/15/2010  . H/O dissecting abdominal aortic aneurysm repair 08/24/2010  . IRRITABLE BOWEL SYNDROME 09/09/2009  . Obstructive sleep apnea 08/04/2009  . External hemorrhoid 06/26/2009  . Osteoporosis 01/09/2009  . Hyperlipidemia 07/26/2007  . Depression with anxiety 07/26/2007  . Essential hypertension 07/26/2007  . Allergic rhinitis 07/26/2007  . Asthma 07/26/2007  . GERD 07/26/2007  . Osteoarthritis 07/26/2007  . Ocular migraine 07/26/2007  . Mild intermittent asthma without complication 24/58/0998   Janna Arch, PT, DPT   07/11/2020, 11:45 AM  [place] MAIN [facility] SERVICES [address] [place], [place], [address] Phone: [phone]   Fax:  [phone]  Name: Rachel Torres MRN: [id number] Date of Birth: Jul 07, 1956

## 2020-07-15 ENCOUNTER — Other Ambulatory Visit: Payer: Self-pay

## 2020-07-15 ENCOUNTER — Ambulatory Visit: Payer: Medicare HMO

## 2020-07-15 DIAGNOSIS — M542 Cervicalgia: Secondary | ICD-10-CM

## 2020-07-15 DIAGNOSIS — R293 Abnormal posture: Secondary | ICD-10-CM

## 2020-07-15 DIAGNOSIS — M545 Low back pain, unspecified: Secondary | ICD-10-CM

## 2020-07-15 DIAGNOSIS — G8929 Other chronic pain: Secondary | ICD-10-CM | POA: Diagnosis not present

## 2020-07-15 DIAGNOSIS — M546 Pain in thoracic spine: Secondary | ICD-10-CM | POA: Diagnosis not present

## 2020-07-15 NOTE — Therapy (Signed)
[place] MAIN [facility] SERVICES [address] [place], [place], [address] Phone: [phone]   Fax:  [phone]  Physical Therapy Treatment  Patient Details  Name: Rachel Torres MRN: [id number] Date of Birth: 02-Nov-1956 Referring Provider (PT): Glori Bickers, Wynelle Fanny MD   Encounter Date: 07/15/2020   PT End of Session - 07/16/20 1708    Visit Number 13    Number of Visits 28    Date for PT Re-Evaluation 09/02/20    Authorization Type 3/10 07/01/20    Authorization Time Period back pain added to POC on 06/17/20    PT Start Time 1345    PT Stop Time 1428    PT Time Calculation (min) 43 min    Activity Tolerance Patient tolerated treatment well    Behavior During Therapy [facility] for tasks assessed/performed           Past Medical History:  Diagnosis Date  . AAA (abdominal aortic aneurysm) ([place])    a. Chronic w/o evidence of aneurysmal dil on CTA 01/2016.  Marland Kitchen Alcohol abuse   . Allergy   . Anemia   . Anxiety   . CAD (coronary artery disease)    a. 08/2010 s/p CABG x 1 (VG->RCA) @ time of Ao dissection repair; b. 01/2016 Lexiscan MV: mid antsept/apical defect w/ ? peri-infarct ischemia-->likely attenuation-->Med Rx.  . Chewing difficulty   . Chronic combined systolic (congestive) and diastolic (congestive) heart failure ([place])    a. 2012 EF 30-35%; b. 04/2015 EF 40-45%; c. 01/2016 Echo: Ef 55-65%, nl AoV bioprosthesis, nl RV, nl PASP.  Marland Kitchen Constipation   . Depression   . Edema of both lower extremities   . Gallbladder sludge   . GERD (gastroesophageal reflux disease)   . Heart failure ([place])   . Heart valve problem   . History of Bicuspid Aortic Valve    a. 08/2010 s/p AVR @ time of Ao dissection repair; b. 01/2016 Echo: Ef 55-65%, nl AoV bioprosthesis, nl RV, nl PASP.  Marland Kitchen Hx of repair of dissecting thoracic aortic aneurysm, [place] type A    a. 08/2010 s/p repair with AVR and VG->RCA; b. 01/2016 CTA: stable appearance of Asc Thoracic Aortic graft.  Opacification of flase lumen of chronic abd Ao dissection w/ retrograde flow through lumbar arteries. No aneurysmal dil of flase lumen.  . Hyperlipidemia   . Hypertensive heart disease   . IBS (irritable bowel syndrome)   . Internal hemorrhoids   . Kidney problem   . Marfan syndrome    pt denies  . Migraine   . Obstructive sleep apnea   . Osteoarthritis   . Osteoporosis   . Palpitations   . Pneumonia   . RA (rheumatoid arthritis) ([place])    a. Followed by Dr. [name]    Past Surgical History:  Procedure Laterality Date  . ABDOMINAL AORTIC ANEURYSM REPAIR    . CARDIAC CATHETERIZATION  2001  . [place]  . CHOLECYSTECTOMY    . COLONOSCOPY    . ESOPHAGOGASTRODUODENOSCOPY (EGD) WITH PROPOFOL N/A 05/08/2020   Procedure: ESOPHAGOGASTRODUODENOSCOPY (EGD) WITH PROPOFOL;  Surgeon: Virgel Manifold, MD;  Location: [facility] ENDOSCOPY;  Service: Endoscopy;  Laterality: N/A;  . FRACTURE SURGERY Right    shoulder replacement  . HEMORRHOID BANDING    . ORIF DISTAL RADIUS FRACTURE Left   . UPPER GASTROINTESTINAL ENDOSCOPY      There were no vitals filed for this visit.   Subjective Assessment - 07/16/20 1706  Subjective Patient reports her back spasm is improving. Has been compliant with HEP, migraines are less frequent and less severe.    Pertinent History Patient has been seen by this therapist in the past for back related injuries/pain. Was last seen by this author in December of 2019. Since discharge patient has had COVID and an upper GI due to stomach issues. PMH includes aorta type A dissection (2011) with aortic valve replacement, bypass of R Coronary artery, hypertension, hyperlipidemia, CHF, pre-diabetes, AFIB, anxiety, depression, arthritis, GERD, atypical migraine, asthma, allergic rhinitis. 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 Does now have help with aide.    Limitations Sitting;Reading;Lifting;Standing;Walking;Writing;Other (comment);House hold  activities    How long can you sit comfortably? 20 mins before whole body discomfort    How long can you stand comfortably? 5 mins  or less: lower back    How long can you walk comfortably? 2 minutes    Patient Stated Goals to decrease pain and migraines    Currently in Pain? Yes    Pain Score 1     Pain Location Back    Pain Orientation Lower    Pain Descriptors / Indicators Aching    Pain Onset More than a month ago               Treatment:    Manual Prone:              CPAs and UPAs grade II-IV to thoracic and lumbar spine, 5x 10 seconds each level. Hypomobility throughout with reduction with repetition. T10 and L2 hypomobile               Prone STM with implementation of effleurage and pettrisage to thoracic and lumbar paraspinals L and R; multiple adhesions throughout paraspinal musculature with pain relief reported.    Supine             Figure 4 with overpressure 60 second holds          hamstring lengthening with popliteal angle for low back pain reduction    Seated:  Trigger point/soft tissue release with movement to improve soft tissue limited muscle tissue length. 5 minutes    STM with focus on levator scap, upper trap, cervical paraspinals, multiple trigger points requiring release x 7 minutes    TherEx             Posterior pelvic tilt 15 x 5 second holds              LE rotation 60 seconds focus on slow controlled lengthenin              Prone single arm row 15x each UE             Child pose 2x30 seconds  Seated on swiss ball: TrA contraction: 10 UE raises alternating, 10 toe raises alternating, 10 LE kicks alternating; very challenging     Pt educated throughout session about proper posture and technique with exercises. Improved exercise technique, movement at target joints, use of target muscles after min to mod verbal, visual, tactile cues                         PT Education - 07/16/20 1707    Education provided Yes    Education  Details exercise technique, body mechanics    Person(s) Educated Patient    Methods Explanation;Demonstration;Tactile cues;Verbal cues    Comprehension Verbalized understanding;Returned demonstration;Verbal cues required;Tactile cues required  PT Short Term Goals - 07/09/20 1252      PT SHORT TERM GOAL #1   Title Patient will be independent in home exercise program to improve strength/mobility and decrease pain for better functional independence with ADLs.    Baseline 5/26: HEP next session 7/14; HEP compliant    Time 2    Period Weeks    Status Achieved    Target Date 05/28/20      PT SHORT TERM GOAL #2   Title Patient will report a worst pain of 7/10 on VAS in head to improve tolerance with ADLs and reduced symptoms with activities.    Baseline 5/26: 9/10 7/14: 7/10    Time 2    Period Weeks    Status Achieved    Target Date 05/28/20      PT SHORT TERM GOAL #3   Title Patient will report a worst pain of 6/10 on VAS in thoracic and lumbar spine to improve tolerance with ADLs and reduced symptoms with activities.    Baseline 6/29: 8/10 pain 7/14: 8/10    Time 2    Period Weeks    Status On-going    Target Date 07/23/20             PT Long Term Goals - 07/09/20 1252      PT LONG TERM GOAL #1   Title Patient will report a worst pain of 3/10 on VAS in her head/migraines to improve tolerance with ADLs and reduced symptoms with activities    Baseline 5/26: 9/10 7/14: 7/10    Time 8    Period Weeks    Status Partially Met    Target Date 09/02/20      PT LONG TERM GOAL #2   Title Patient will increase FOTO score to equal to or greater than   45/100  to demonstrate statistically significant improvement in mobility and quality of life.    Baseline 5/26: 44 with risk adjusted 33/100 7/14: 38.7%    Time 8    Period Weeks    Status On-going    Target Date 09/02/20      PT LONG TERM GOAL #3   Title Patient will reduce dizziness handicap inventory score to <30,  for less dizziness with ADLs and increased safety with home and work tasks.    Baseline 5/26: 40 7/14: 34%    Time 8    Period Weeks    Status Partially Met    Target Date 09/02/20      PT LONG TERM GOAL #4   Title Patient will improve cervical rotation ROM to >/=60 degrees bilaterally to improve functional mobility and demonstrate improved muscle tissue length    Baseline 5/26: R 46 L 42 7/14: assess next session 7/21: R 51 L 49    Time 8    Period Weeks    Status Partially Met    Target Date 09/02/20      PT LONG TERM GOAL #5   Title Patient will report a worst pain of 3/10 on VAS in  thoracic and lumbar spine    to improve tolerance with ADLs and reduced symptoms with activities and caregiver duties    Baseline 6/29: 8/10 7/13: 8/10    Time 8    Period Weeks    Status On-going    Target Date 09/02/20      PT LONG TERM GOAL #6   Title Patient will tolerate sitting unsupported demonstrating erect sitting posture with minimal thoracic kyphosis  for 15+ minutes with maximum of 5/10 back pain to demonstrate improved back extensor strength and improved sitting tolerance.    Baseline 6/29: painful and limited 7/14: can sit 15 minutes but has increasing forward head posture with prolonged hold    Time 8    Period Weeks    Status Partially Met    Target Date 09/02/20      PT LONG TERM GOAL #7   Title Patient will reduce modified Oswestry score to <20 as to demonstrate minimal disability with ADLs including improved sleeping tolerance, walking/sitting tolerance etc for better mobility with ADLs.    Baseline 6/29: 46% 7/13: 49%    Time 8    Period Weeks    Status Partially Met    Target Date 09/02/20                 Plan - 07/16/20 1710    Clinical Impression Statement Patient introduced to core progression tolerating it well despite fatigue. Postural correction is improving with decreased extrinsic cueing required and more internal cueing performed. utilized mobilizations to  cervical and lumbar spine including STM to posterior cervical/upper thoracic spine. Improved pain-free cervical motion following intervention. Pt encouraged to continue HEP and follow-up as scheduled. Patient will continue to benefit from skilled physical therapy to reduce pain, improve spinal mobility, and allow patient better quality of life.    Personal Factors and Comorbidities Age;Comorbidity 3+;Fitness;Finances;Past/Current Experience;Social Background;Time since onset of injury/illness/exacerbation;Other    Comorbidities aorta type A dissection (2011) with aortic valve replacement, bypass of R Coronary artery, hypertension, hyperlipidemia, CHF, pre-diabetes, AFIB, anxiety, depression, arthritis, GERD, atypical migraine, asthma, allergic rhinitis.    Examination-Activity Limitations Bend;Caring for Others;Carry;Dressing;Stairs;Squat;Sit;Reach Overhead;Locomotion Level;Lift;Stand;Toileting;Transfers    Examination-Participation Restrictions Church;Cleaning;Community Activity;Interpersonal Relationship;Driving;Laundry;Volunteer;Shop;Personal Finances;Meal Prep;Medication Management;Yard Work    Product manager    Rehab Potential Fair    Clinical Impairments Affecting Rehab Potential + CAD testing, history of AAA, anemia, anxiety, asthma, CAD, chronic combined ystolic heart failure, GERD, Marfan syndrome, OA, RA (+) good work Psychologist, forensic, motivated, age     PT Frequency 2x / week    PT Duration 8 weeks    PT Treatment/Interventions ADLs/Self Care Home Management;Aquatic Therapy;Vestibular;Vasopneumatic Device;Taping;Passive range of motion;Dry needling;Energy conservation;Manual techniques;Therapeutic exercise;Balance training;Neuromuscular re-education;Patient/family education;Therapeutic activities;Functional mobility training;Stair training;Gait training;Electrical Stimulation;Iontophoresis 24m/ml Dexamethasone;Moist  Heat;Traction;Ultrasound;Cryotherapy;Canalith Repostioning;Joint Manipulations;Spinal Manipulations    PT Next Visit Plan back interventions; posture    PT Home Exercise Plan Educated in seated Lev Scap and U/T stretches for HEP, as well as scap pinches and chin tucks.    Consulted and Agree with Plan of Care Patient           Patient will benefit from skilled therapeutic intervention in order to improve the following deficits and impairments:  Cardiopulmonary status limiting activity, Decreased activity tolerance, Decreased endurance, Decreased mobility, Decreased range of motion, Decreased strength, Difficulty walking, Dizziness, Hypomobility, Impaired flexibility, Impaired perceived functional ability, Increased muscle spasms, Impaired UE functional use, Impaired vision/preception, Obesity, Postural dysfunction, Improper body mechanics, Pain, Abnormal gait  Visit Diagnosis: Cervicalgia  Chronic low back pain, unspecified back pain laterality, unspecified whether sciatica present  Abnormal posture  Pain in thoracic spine     Problem List Patient Active Problem List   Diagnosis Date Noted  . Acute bronchitis with bronchospasm 05/28/2020  . Diarrhea 04/11/2020  . Epigastric pain 02/19/2020  . Dark stools 02/19/2020  . PAF (paroxysmal atrial fibrillation) ([place] 02/05/2019  . Shortness of breath 01/27/2019  . Irregular surface of  cornea 12/25/2018  . HPV (human papilloma virus) infection 12/01/2016  . Screening mammogram, encounter for 11/29/2016  . Estrogen deficiency 11/29/2016  . Encounter for routine gynecological examination 11/29/2016  . Grade III hemorrhoids 10/06/2016  . Tachycardia 08/14/2016  . Hemorrhoids 08/13/2016  . Atypical migraine 08/03/2016  . CAD (coronary artery disease)   . AAA (abdominal aortic aneurysm) ([place])   . Chronic combined systolic (congestive) and diastolic (congestive) heart failure ([place])   . Prediabetes 02/11/2016  . Generalized anxiety  disorder 12/08/2015  . Panic attacks 07/28/2015  . Sensorineural hearing loss 03/27/2013  . Vitamin D deficiency 07/03/2012  . H/O aortic dissection 05/12/2012  . Class 2 obesity due to excess calories with body mass index (BMI) of 35.0 to 35.9 in adult 11/21/2011  . Obesity (BMI 30.0-34.9) 11/21/2011  . Routine general medical examination at a health care facility 09/20/2011  . S/P AVR (aortic valve replacement) 01/07/2011  . CORONARY ARTERY BYPASS GRAFT, HX OF 01/07/2011  . Arthritis 12/20/2010  . Back pain 12/15/2010  . Dissection of thoracic aorta ([place]) 12/11/2010  . H/O dissecting abdominal aortic aneurysm repair 08/24/2010  . IRRITABLE BOWEL SYNDROME 09/09/2009  . Obstructive sleep apnea 08/04/2009  . External hemorrhoid 06/26/2009  . Osteoporosis 01/09/2009  . Hyperlipidemia 07/26/2007  . Depression with anxiety 07/26/2007  . Essential hypertension 07/26/2007  . Allergic rhinitis 07/26/2007  . Asthma 07/26/2007  . GERD 07/26/2007  . Osteoarthritis 07/26/2007  . Ocular migraine 07/26/2007  . Mild intermittent asthma without complication 48/88/9169   Janna Arch, PT, DPT   07/16/2020, 5:11 PM  [place] MAIN [facility] SERVICES [address] [place], [place], [address] Phone: [phone]   Fax:  [phone]  Name: Rachel Torres MRN: [id number] Date of Birth: [birth year]

## 2020-07-16 ENCOUNTER — Ambulatory Visit: Payer: Medicare HMO | Admitting: Psychology

## 2020-07-16 ENCOUNTER — Encounter: Payer: Self-pay | Admitting: Gastroenterology

## 2020-07-16 ENCOUNTER — Ambulatory Visit (INDEPENDENT_AMBULATORY_CARE_PROVIDER_SITE_OTHER): Payer: Medicare HMO | Admitting: Gastroenterology

## 2020-07-16 ENCOUNTER — Other Ambulatory Visit: Payer: Self-pay

## 2020-07-16 VITALS — BP 93/59 | HR 98 | Temp 98.2°F | Ht 65.0 in | Wt 200.2 lb

## 2020-07-16 DIAGNOSIS — K219 Gastro-esophageal reflux disease without esophagitis: Secondary | ICD-10-CM

## 2020-07-16 NOTE — Patient Instructions (Signed)
Please take Omeprazole once a day instead of twice. We will see you when you need [place].

## 2020-07-17 MED ORDER — OMEPRAZOLE 40 MG PO CPDR: 40.0000 mg | DELAYED_RELEASE_CAPSULE | Freq: Every day | ORAL | 0 refills | Status: DC

## 2020-07-17 NOTE — Progress Notes (Signed)
Rachel Antigua, MD [address]  [place], [place] [address]  Main: [phone]  Fax: [phone]   Primary Care Physician: Tower, Wynelle Fanny, MD   Chief Complaint  Patient presents with  . Gastroesophageal Reflux    HPI: Rachel Torres is a 64 y.o. female with chronic history of reflux here for follow-up.  Patient has been on omeprazole chronically, but due to breakthrough symptoms, this was increased to twice daily.  This has helped her symptoms with no further breakthrough symptoms occurring.  EGD in May 2021 showed gastric erythema and biopsies showed reactive foveolar hyperplasia.  Gastric polyps were also biopsy that showed fundic gland polyps.  Ectopic gastric mucosa seen in the esophagus.  Otherwise reassuring exam.  Patient reports last colonoscopy was in 2014 in [place] and was normal  Previous EGD in 2007 showed esophagitis, hiatal hernia, gastric polyp, pathology report not available.  Current Outpatient Medications  Medication Sig Dispense Refill  . albuterol (PROVENTIL HFA;VENTOLIN HFA) 108 (90 Base) MCG/ACT inhaler Inhale 1-2 puffs into the lungs every 6 (six) hours as needed for wheezing or shortness of breath. 1 Inhaler 0  . albuterol (PROVENTIL) (5 MG/ML) 0.5% nebulizer solution Take 0.5 mLs (2.5 mg total) by nebulization every 6 (six) hours as needed for wheezing or shortness of breath. 20 mL 12  . ALPRAZolam (XANAX) 0.5 MG tablet Take 1 tablet (0.5 mg total) by mouth daily as needed for anxiety. 30 tablet 0  . aspirin EC 81 MG tablet Take 81 mg by mouth at bedtime.    Marland Kitchen atorvastatin (LIPITOR) 20 MG tablet TAKE 1 TABLET EVERY DAY (Patient taking differently: Take 20 mg by mouth every other day. ) 90 tablet 2  . carvedilol (COREG) 6.25 MG tablet TAKE 1 TABLET TWICE DAILY WITH A MEAL 180 tablet 1  . Cholecalciferol (VITAMIN D3) 5000 units CAPS Take 1 capsule by mouth daily.    . cyclobenzaprine (FLEXERIL) 10 MG tablet Take 0.5-1 tablets  (5-10 mg total) by mouth 3 (three) times daily as needed. For headache 90 tablet 1  . furosemide (LASIX) 20 MG tablet Take 1 tablet (20 mg total) by mouth daily. Take 1 tablet (20 mg) daily, may take additional 1 tablet (20 mg) in the afternoon as needed for swelling, shortness of breath, or weight gain 180 tablet 3  . Krill Oil (OMEGA-3) 500 MG CAPS Take 1 capsule by mouth daily.    Marland Kitchen lisinopril (ZESTRIL) 5 MG tablet TAKE 1/2 TABLET EVERY DAY 45 tablet 2  . magnesium gluconate (MAGONATE) 500 MG tablet Take 500 mg by mouth at bedtime.    . Misc Natural Products (TART CHERRY ADVANCED) CAPS Take 1 capsule by mouth daily.    . naproxen sodium (ANAPROX) 220 MG tablet Take 440 mg by mouth 2 (two) times daily as needed (for headaches/pain).    . nystatin (MYCOSTATIN/NYSTOP) powder Apply 1 application topically 2 (two) times daily. Apply to affected area twice a day for 14 days 60 g 0  . omeprazole (PRILOSEC) 40 MG capsule Take 1 capsule (40 mg total) by mouth in the morning and at bedtime. 180 capsule 1  . potassium chloride (KLOR-CON) 10 MEQ tablet Take 1 tablet (10 mEq total) by mouth 2 (two) times daily as needed. Take additional pill when increasing Furosemide. 270 tablet 3  . promethazine (PHENERGAN) 25 MG tablet Take 0.5 tablets (12.5 mg total) by mouth every 8 (eight) hours as needed for nausea. 90 tablet 1  .  promethazine-dextromethorphan (PROMETHAZINE-DM) 6.25-15 MG/5ML syrup Take 5 mLs by mouth 4 (four) times daily as needed for cough. 118 mL 0  . Simethicone (MYLANTA GAS PO) Take by mouth 3 times/day as needed-between meals & bedtime.    Marland Kitchen venlafaxine XR (EFFEXOR-XR) 150 MG 24 hr capsule TAKE 1 CAPSULE EVERY DAY WITH BREAKFAST 90 capsule 1  . nitroGLYCERIN (NITROSTAT) 0.4 MG SL tablet Place 1 tablet (0.4 mg total) under the tongue every 5 (five) minutes as needed for chest pain. (Patient not taking: Reported on 07/16/2020) 25 tablet 3   No current facility-administered medications for this visit.     Allergies as of 07/16/2020 - Review Complete 07/16/2020  Allergen Reaction Noted  . Fosamax [alendronate sodium]  04/29/2017  . Ginzing [ginseng] Other (See Comments) 10/06/2016  . Hydrocod polst-cpm polst er Itching 09/20/2011  . Hydrocodone-acetaminophen Itching   . Imitrex [sumatriptan]  04/29/2017  . Oxycodone Itching 04/18/2015  . Peanut-containing drug products Other (See Comments)   . Tramadol Other (See Comments) 09/07/2017  . Hydrocodone-guaifenesin Itching 06/07/2013  . Prednisone Rash 07/26/2007  . Tape Itching 09/20/2011    ROS:  General: Negative for anorexia, weight loss, fever, chills, fatigue, weakness. ENT: Negative for hoarseness, difficulty swallowing , nasal congestion. CV: Negative for chest pain, angina, palpitations, dyspnea on exertion, peripheral edema.  Respiratory: Negative for dyspnea at rest, dyspnea on exertion, cough, sputum, wheezing.  GI: See history of present illness. GU:  Negative for dysuria, hematuria, urinary incontinence, urinary frequency, nocturnal urination.  Endo: Negative for unusual weight change.    Physical Examination:   BP (!) 93/59   Pulse 98   Temp 98.2 F (36.8 C) (Oral)   Ht 5\' 5"  (1.651 m)   Wt 200 lb 3.2 oz (90.8 kg)   BMI 33.32 kg/m   General: Well-nourished, well-developed in no acute distress.  Eyes: No icterus. Conjunctivae pink. Mouth: Oropharyngeal mucosa moist and pink , no lesions erythema or exudate. Neck: Supple, Trachea midline Abdomen: Bowel sounds are normal, nontender, nondistended, no hepatosplenomegaly or masses, no abdominal bruits or hernia , no rebound or guarding.   Extremities: No lower extremity edema. No clubbing or deformities. Neuro: Alert and oriented x 3.  Grossly intact. Skin: Warm and dry, no jaundice.   Psych: Alert and cooperative, normal mood and affect.   Labs: CMP     Component Value Date/Time   NA 140 02/19/2020 1300   NA 139 09/02/2016 1440   NA 143 06/20/2013 0815    K 4.1 02/19/2020 1300   K 3.8 06/20/2013 0815   CL 102 02/19/2020 1300   CL 107 06/20/2013 0815   CO2 31 02/19/2020 1300   CO2 26 06/20/2013 0815   GLUCOSE 97 02/19/2020 1300   GLUCOSE 106 (H) 06/20/2013 0815   GLUCOSE 90 12/29/2006 1251   BUN 12 02/19/2020 1300   BUN 20 09/02/2016 1440   BUN 16 06/20/2013 0815   CREATININE 0.91 02/19/2020 1300   CREATININE 0.94 06/20/2013 0815   CREATININE 0.88 04/09/2011 0900   CALCIUM 9.3 02/19/2020 1300   CALCIUM 8.8 06/20/2013 0815   PROT 7.4 02/19/2020 1300   PROT 6.6 10/27/2015 0838   PROT 7.0 06/20/2013 0815   ALBUMIN 4.2 02/19/2020 1300   ALBUMIN 4.0 10/27/2015 0838   ALBUMIN 3.5 06/20/2013 0815   AST 22 02/19/2020 1300   AST 17 06/20/2013 0815   ALT 23 02/19/2020 1300   ALT 26 06/20/2013 0815   ALKPHOS 67 02/19/2020 1300   ALKPHOS 68 06/20/2013 0815  BILITOT 0.6 02/19/2020 1300   BILITOT 0.4 10/27/2015 0838   BILITOT 0.3 06/20/2013 0815   GFRNONAA >60 01/21/2018 1841   GFRNONAA >60 06/20/2013 0815   GFRAA >60 01/21/2018 1841   GFRAA >60 06/20/2013 0815   Lab Results  Component Value Date   WBC 5.8 02/19/2020   HGB 13.0 02/19/2020   HCT 38.5 02/19/2020   MCV 95.4 02/19/2020   PLT 284.0 02/19/2020    Imaging Studies: No results found.  Assessment and Plan:   VERNELLA Torres is a 64 y.o. y/o female here for follow-up of chronic reflux  Since symptoms have improved, I have advised her to decrease her omeprazole to once a day.  If symptoms get worse, to call us  If symptoms remain well controlled, to discontinue her omeprazole in 1 to 2 months.  Pepcid can be used if symptoms recur  Patient willing to try decreasing the medication and discontinuing it in 1 to 2 months  (Risks of PPI use were discussed with patient including bone loss, C. Diff diarrhea, pneumonia, infections, CKD, electrolyte abnormalities.  Pt. Verbalizes understanding and chooses to continue the medication.)     Dr Rachel Torres

## 2020-07-18 ENCOUNTER — Ambulatory Visit: Payer: Medicare HMO

## 2020-07-18 ENCOUNTER — Other Ambulatory Visit: Payer: Self-pay

## 2020-07-18 DIAGNOSIS — R293 Abnormal posture: Secondary | ICD-10-CM | POA: Diagnosis not present

## 2020-07-18 DIAGNOSIS — M545 Low back pain, unspecified: Secondary | ICD-10-CM

## 2020-07-18 DIAGNOSIS — G8929 Other chronic pain: Secondary | ICD-10-CM | POA: Diagnosis not present

## 2020-07-18 DIAGNOSIS — M546 Pain in thoracic spine: Secondary | ICD-10-CM

## 2020-07-18 DIAGNOSIS — M542 Cervicalgia: Secondary | ICD-10-CM

## 2020-07-18 NOTE — Therapy (Signed)
[place] MAIN [facility] SERVICES [address] [place], [place], [address] Phone: [phone]   Fax:  [phone]  Physical Therapy Treatment  Patient Details  Name: Rachel Torres MRN: [id number] Date of Birth: 26-Feb-1956 Referring Provider (PT): Glori Bickers, Wynelle Fanny MD   Encounter Date: 07/18/2020   PT End of Session - 07/18/20 0853    Visit Number 14    Number of Visits 28    Date for PT Re-Evaluation 09/02/20    Authorization Type 4/10 07/01/20    Authorization Time Period back pain added to POC on 06/17/20    PT Start Time 0859    PT Stop Time 0944    PT Time Calculation (min) 45 min    Activity Tolerance Patient tolerated treatment well    Behavior During Therapy [facility] for tasks assessed/performed           Past Medical History:  Diagnosis Date  . AAA (abdominal aortic aneurysm) ([place])    a. Chronic w/o evidence of aneurysmal dil on CTA 01/2016.  Marland Kitchen Alcohol abuse   . Allergy   . Anemia   . Anxiety   . CAD (coronary artery disease)    a. 08/2010 s/p CABG x 1 (VG->RCA) @ time of Ao dissection repair; b. 01/2016 Lexiscan MV: mid antsept/apical defect w/ ? peri-infarct ischemia-->likely attenuation-->Med Rx.  . Chewing difficulty   . Chronic combined systolic (congestive) and diastolic (congestive) heart failure ([place])    a. 2012 EF 30-35%; b. 04/2015 EF 40-45%; c. 01/2016 Echo: Ef 55-65%, nl AoV bioprosthesis, nl RV, nl PASP.  Marland Kitchen Constipation   . Depression   . Edema of both lower extremities   . Gallbladder sludge   . GERD (gastroesophageal reflux disease)   . Heart failure ([place])   . Heart valve problem   . History of Bicuspid Aortic Valve    a. 08/2010 s/p AVR @ time of Ao dissection repair; b. 01/2016 Echo: Ef 55-65%, nl AoV bioprosthesis, nl RV, nl PASP.  Marland Kitchen Hx of repair of dissecting thoracic aortic aneurysm, [place] type A    a. 08/2010 s/p repair with AVR and VG->RCA; b. 01/2016 CTA: stable appearance of Asc Thoracic Aortic graft.  Opacification of flase lumen of chronic abd Ao dissection w/ retrograde flow through lumbar arteries. No aneurysmal dil of flase lumen.  . Hyperlipidemia   . Hypertensive heart disease   . IBS (irritable bowel syndrome)   . Internal hemorrhoids   . Kidney problem   . Marfan syndrome    pt denies  . Migraine   . Obstructive sleep apnea   . Osteoarthritis   . Osteoporosis   . Palpitations   . Pneumonia   . RA (rheumatoid arthritis) ([place])    a. Followed by Dr. [name]    Past Surgical History:  Procedure Laterality Date  . ABDOMINAL AORTIC ANEURYSM REPAIR    . CARDIAC CATHETERIZATION  2001  . [place]  . CHOLECYSTECTOMY    . COLONOSCOPY    . ESOPHAGOGASTRODUODENOSCOPY (EGD) WITH PROPOFOL N/A 05/08/2020   Procedure: ESOPHAGOGASTRODUODENOSCOPY (EGD) WITH PROPOFOL;  Surgeon: Virgel Manifold, MD;  Location: [facility] ENDOSCOPY;  Service: Endoscopy;  Laterality: N/A;  . FRACTURE SURGERY Right    shoulder replacement  . HEMORRHOID BANDING    . ORIF DISTAL RADIUS FRACTURE Left   . UPPER GASTROINTESTINAL ENDOSCOPY      There were no vitals filed for this visit.   Subjective Assessment - 07/18/20 1224  Subjective Patient reports compliance with HEP, pain is getting better and posture is easier to maintain.    Pertinent History Patient has been seen by this therapist in the past for back related injuries/pain. Was last seen by this author in December of 2019. Since discharge patient has had COVID and an upper GI due to stomach issues. PMH includes aorta type A dissection (2011) with aortic valve replacement, bypass of R Coronary artery, hypertension, hyperlipidemia, CHF, pre-diabetes, AFIB, anxiety, depression, arthritis, GERD, atypical migraine, asthma, allergic rhinitis. 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 Does now have help with aide.    Limitations Sitting;Reading;Lifting;Standing;Walking;Writing;Other (comment);House hold activities    How long can you  sit comfortably? 20 mins before whole body discomfort    How long can you stand comfortably? 5 mins  or less: lower back    How long can you walk comfortably? 2 minutes    Patient Stated Goals to decrease pain and migraines    Currently in Pain? Yes    Pain Score 2     Pain Location Back    Pain Orientation Upper;Mid;Lower    Pain Descriptors / Indicators Aching    Pain Type Chronic pain    Pain Onset More than a month ago    Pain Frequency Intermittent                Treatment:    Manual Prone:              CPAs and UPAs grade II-IV to thoracic and lumbar spine, 5x 10 seconds each level. Hypomobility throughout with reduction with repetition. T10 and L2 hypomobile               Prone STM with implementation of effleurage and pettrisage to thoracic and lumbar paraspinals L and R; multiple adhesions throughout paraspinal musculature with pain relief reported.      Seated:  Trigger point/soft tissue release with movement to improve soft tissue limited muscle tissue length. 5 minutes    STM with focus on levator scap, upper trap, cervical paraspinals, multiple trigger points requiring release x 7 minutes    TherEx             Posterior pelvic tilt 15 x 5 second holds  Supine: Holding PVC pipe at bench press level for upper abdominal activation, single knee to chest marching for core activation 12x each LE  Supine: PVC pipe overhead single LE raises for flutter kick 10x each LE              Prone single arm row 15x each UE  Standing with PVC pipe raised overhead marching for core activation and stability x 12 each LE, very challenging               Seated on swiss ball: TrA contraction: 10 UE raises alternating, 10 toe raises alternating, 10 LE kicks alternating; very challenging     Pt educated throughout session about proper posture and technique with exercises. Improved exercise technique, movement at target joints, use of target muscles after min to mod verbal, visual,  tactile cues                          PT Education - 07/18/20 [phone]    Education provided Yes    Education Details exercise technique, body mechanics, manual    Person(s) Educated Patient    Methods Explanation;Demonstration;Tactile cues;Verbal cues    Comprehension Verbalized understanding;Returned demonstration;Verbal  cues required;Tactile cues required            PT Short Term Goals - 07/09/20 1252      PT SHORT TERM GOAL #1   Title Patient will be independent in home exercise program to improve strength/mobility and decrease pain for better functional independence with ADLs.    Baseline 5/26: HEP next session 7/14; HEP compliant    Time 2    Period Weeks    Status Achieved    Target Date 05/28/20      PT SHORT TERM GOAL #2   Title Patient will report a worst pain of 7/10 on VAS in head to improve tolerance with ADLs and reduced symptoms with activities.    Baseline 5/26: 9/10 7/14: 7/10    Time 2    Period Weeks    Status Achieved    Target Date 05/28/20      PT SHORT TERM GOAL #3   Title Patient will report a worst pain of 6/10 on VAS in thoracic and lumbar spine to improve tolerance with ADLs and reduced symptoms with activities.    Baseline 6/29: 8/10 pain 7/14: 8/10    Time 2    Period Weeks    Status On-going    Target Date 07/23/20             PT Long Term Goals - 07/09/20 1252      PT LONG TERM GOAL #1   Title Patient will report a worst pain of 3/10 on VAS in her head/migraines to improve tolerance with ADLs and reduced symptoms with activities    Baseline 5/26: 9/10 7/14: 7/10    Time 8    Period Weeks    Status Partially Met    Target Date 09/02/20      PT LONG TERM GOAL #2   Title Patient will increase FOTO score to equal to or greater than   45/100  to demonstrate statistically significant improvement in mobility and quality of life.    Baseline 5/26: 44 with risk adjusted 33/100 7/14: 38.7%    Time 8    Period Weeks     Status On-going    Target Date 09/02/20      PT LONG TERM GOAL #3   Title Patient will reduce dizziness handicap inventory score to <30, for less dizziness with ADLs and increased safety with home and work tasks.    Baseline 5/26: 40 7/14: 34%    Time 8    Period Weeks    Status Partially Met    Target Date 09/02/20      PT LONG TERM GOAL #4   Title Patient will improve cervical rotation ROM to >/=60 degrees bilaterally to improve functional mobility and demonstrate improved muscle tissue length    Baseline 5/26: R 46 L 42 7/14: assess next session 7/21: R 51 L 49    Time 8    Period Weeks    Status Partially Met    Target Date 09/02/20      PT LONG TERM GOAL #5   Title Patient will report a worst pain of 3/10 on VAS in  thoracic and lumbar spine    to improve tolerance with ADLs and reduced symptoms with activities and caregiver duties    Baseline 6/29: 8/10 7/13: 8/10    Time 8    Period Weeks    Status On-going    Target Date 09/02/20      PT LONG TERM GOAL #6  Title Patient will tolerate sitting unsupported demonstrating erect sitting posture with minimal thoracic kyphosis for 15+ minutes with maximum of 5/10 back pain to demonstrate improved back extensor strength and improved sitting tolerance.    Baseline 6/29: painful and limited 7/14: can sit 15 minutes but has increasing forward head posture with prolonged hold    Time 8    Period Weeks    Status Partially Met    Target Date 09/02/20      PT LONG TERM GOAL #7   Title Patient will reduce modified Oswestry score to <20 as to demonstrate minimal disability with ADLs including improved sleeping tolerance, walking/sitting tolerance etc for better mobility with ADLs.    Baseline 6/29: 46% 7/13: 49%    Time 8    Period Weeks    Status Partially Met    Target Date 09/02/20                 Plan - 07/18/20 1225    Clinical Impression Statement Patient presents to physical therapy with excellent motivation.  Progressive of core interventions and postural stability tolerated well however does require frequent rest breaks for reduction of low back tension. Postural correction is improving with decreased extrinsic cueing required and more internal cueing performed.Patient will continue to benefit from skilled physical therapy to reduce pain, improve spinal mobility, and allow patient better quality of life.    Personal Factors and Comorbidities Age;Comorbidity 3+;Fitness;Finances;Past/Current Experience;Social Background;Time since onset of injury/illness/exacerbation;Other    Comorbidities aorta type A dissection (2011) with aortic valve replacement, bypass of R Coronary artery, hypertension, hyperlipidemia, CHF, pre-diabetes, AFIB, anxiety, depression, arthritis, GERD, atypical migraine, asthma, allergic rhinitis.    Examination-Activity Limitations Bend;Caring for Others;Carry;Dressing;Stairs;Squat;Sit;Reach Overhead;Locomotion Level;Lift;Stand;Toileting;Transfers    Examination-Participation Restrictions Church;Cleaning;Community Activity;Interpersonal Relationship;Driving;Laundry;Volunteer;Shop;Personal Finances;Meal Prep;Medication Management;Yard Work    Product manager    Rehab Potential Fair    Clinical Impairments Affecting Rehab Potential + CAD testing, history of AAA, anemia, anxiety, asthma, CAD, chronic combined ystolic heart failure, GERD, Marfan syndrome, OA, RA (+) good work Psychologist, forensic, motivated, age     PT Frequency 2x / week    PT Duration 8 weeks    PT Treatment/Interventions ADLs/Self Care Home Management;Aquatic Therapy;Vestibular;Vasopneumatic Device;Taping;Passive range of motion;Dry needling;Energy conservation;Manual techniques;Therapeutic exercise;Balance training;Neuromuscular re-education;Patient/family education;Therapeutic activities;Functional mobility training;Stair training;Gait training;Electrical Stimulation;Iontophoresis '4mg'$ /ml  Dexamethasone;Moist Heat;Traction;Ultrasound;Cryotherapy;Canalith Repostioning;Joint Manipulations;Spinal Manipulations    PT Next Visit Plan back interventions; posture    PT Home Exercise Plan Educated in seated Lev Scap and U/T stretches for HEP, as well as scap pinches and chin tucks.    Consulted and Agree with Plan of Care Patient           Patient will benefit from skilled therapeutic intervention in order to improve the following deficits and impairments:  Cardiopulmonary status limiting activity, Decreased activity tolerance, Decreased endurance, Decreased mobility, Decreased range of motion, Decreased strength, Difficulty walking, Dizziness, Hypomobility, Impaired flexibility, Impaired perceived functional ability, Increased muscle spasms, Impaired UE functional use, Impaired vision/preception, Obesity, Postural dysfunction, Improper body mechanics, Pain, Abnormal gait  Visit Diagnosis: Cervicalgia  Chronic low back pain, unspecified back pain laterality, unspecified whether sciatica present  Abnormal posture  Pain in thoracic spine  Chronic midline low back pain, unspecified whether sciatica present     Problem List Patient Active Problem List   Diagnosis Date Noted  . Acute bronchitis with bronchospasm 05/28/2020  . Diarrhea 04/11/2020  . Epigastric pain 02/19/2020  . Dark stools 02/19/2020  . PAF (paroxysmal atrial fibrillation) (  [place]) 02/05/2019  . Shortness of breath 01/27/2019  . Irregular surface of cornea 12/25/2018  . HPV (human papilloma virus) infection 12/01/2016  . Screening mammogram, encounter for 11/29/2016  . Estrogen deficiency 11/29/2016  . Encounter for routine gynecological examination 11/29/2016  . Grade III hemorrhoids 10/06/2016  . Tachycardia 08/14/2016  . Hemorrhoids 08/13/2016  . Atypical migraine 08/03/2016  . CAD (coronary artery disease)   . AAA (abdominal aortic aneurysm) ([place])   . Chronic combined systolic (congestive) and  diastolic (congestive) heart failure ([place])   . Prediabetes 02/11/2016  . Generalized anxiety disorder 12/08/2015  . Panic attacks 07/28/2015  . Sensorineural hearing loss 03/27/2013  . Vitamin D deficiency 07/03/2012  . H/O aortic dissection 05/12/2012  . Class 2 obesity due to excess calories with body mass index (BMI) of 35.0 to 35.9 in adult 11/21/2011  . Obesity (BMI 30.0-34.9) 11/21/2011  . Routine general medical examination at a health care facility 09/20/2011  . S/P AVR (aortic valve replacement) 01/07/2011  . CORONARY ARTERY BYPASS GRAFT, HX OF 01/07/2011  . Arthritis 12/20/2010  . Back pain 12/15/2010  . Dissection of thoracic aorta ([place]) 12/11/2010  . H/O dissecting abdominal aortic aneurysm repair 08/24/2010  . IRRITABLE BOWEL SYNDROME 09/09/2009  . Obstructive sleep apnea 08/04/2009  . External hemorrhoid 06/26/2009  . Osteoporosis 01/09/2009  . Hyperlipidemia 07/26/2007  . Depression with anxiety 07/26/2007  . Essential hypertension 07/26/2007  . Allergic rhinitis 07/26/2007  . Asthma 07/26/2007  . GERD 07/26/2007  . Osteoarthritis 07/26/2007  . Ocular migraine 07/26/2007  . Mild intermittent asthma without complication 38/33/3832   Janna Arch, PT, DPT   07/18/2020, 12:27 PM  [place] MAIN [facility] SERVICES [address] [place], [place], [address] Phone: [phone]   Fax:  [phone]  Name: Rachel Torres MRN: [id number] Date of Birth: 1956/05/08

## 2020-07-21 ENCOUNTER — Ambulatory Visit: Payer: Medicare HMO | Attending: Family Medicine

## 2020-07-21 ENCOUNTER — Other Ambulatory Visit: Payer: Self-pay

## 2020-07-21 DIAGNOSIS — M545 Low back pain, unspecified: Secondary | ICD-10-CM

## 2020-07-21 DIAGNOSIS — M546 Pain in thoracic spine: Secondary | ICD-10-CM | POA: Insufficient documentation

## 2020-07-21 DIAGNOSIS — M5412 Radiculopathy, cervical region: Secondary | ICD-10-CM | POA: Insufficient documentation

## 2020-07-21 DIAGNOSIS — G8929 Other chronic pain: Secondary | ICD-10-CM | POA: Diagnosis not present

## 2020-07-21 DIAGNOSIS — R293 Abnormal posture: Secondary | ICD-10-CM | POA: Diagnosis not present

## 2020-07-21 DIAGNOSIS — M542 Cervicalgia: Secondary | ICD-10-CM | POA: Insufficient documentation

## 2020-07-21 NOTE — Therapy (Signed)
[place] MAIN [facility] SERVICES [address] [place], [place], [address] Phone: [phone]   Fax:  [phone]  Physical Therapy Treatment  Patient Details  Name: Rachel Torres MRN: [id number] Date of Birth: [birth year] Referring Provider (PT): Glori Bickers, Wynelle Fanny MD   Encounter Date: 07/21/2020   PT End of Session - 07/21/20 1456    Visit Number 15    Number of Visits 28    Date for PT Re-Evaluation 09/02/20    Authorization Type 5/10 07/01/20    Authorization Time Period back pain added to POC on 06/17/20    PT Start Time 1505    PT Stop Time 1550    PT Time Calculation (min) 45 min    Activity Tolerance Patient tolerated treatment well    Behavior During Therapy [facility] for tasks assessed/performed           Past Medical History:  Diagnosis Date  . AAA (abdominal aortic aneurysm) ([place])    a. Chronic w/o evidence of aneurysmal dil on CTA 01/2016.  Marland Kitchen Alcohol abuse   . Allergy   . Anemia   . Anxiety   . CAD (coronary artery disease)    a. 08/2010 s/p CABG x 1 (VG->RCA) @ time of Ao dissection repair; b. 01/2016 Lexiscan MV: mid antsept/apical defect w/ ? peri-infarct ischemia-->likely attenuation-->Med Rx.  . Chewing difficulty   . Chronic combined systolic (congestive) and diastolic (congestive) heart failure ([place])    a. 2012 EF 30-35%; b. 04/2015 EF 40-45%; c. 01/2016 Echo: Ef 55-65%, nl AoV bioprosthesis, nl RV, nl PASP.  Marland Kitchen Constipation   . Depression   . Edema of both lower extremities   . Gallbladder sludge   . GERD (gastroesophageal reflux disease)   . Heart failure ([place])   . Heart valve problem   . History of Bicuspid Aortic Valve    a. 08/2010 s/p AVR @ time of Ao dissection repair; b. 01/2016 Echo: Ef 55-65%, nl AoV bioprosthesis, nl RV, nl PASP.  Marland Kitchen Hx of repair of dissecting thoracic aortic aneurysm, [place] type A    a. 08/2010 s/p repair with AVR and VG->RCA; b. 01/2016 CTA: stable appearance of Asc Thoracic Aortic graft.  Opacification of flase lumen of chronic abd Ao dissection w/ retrograde flow through lumbar arteries. No aneurysmal dil of flase lumen.  . Hyperlipidemia   . Hypertensive heart disease   . IBS (irritable bowel syndrome)   . Internal hemorrhoids   . Kidney problem   . Marfan syndrome    pt denies  . Migraine   . Obstructive sleep apnea   . Osteoarthritis   . Osteoporosis   . Palpitations   . Pneumonia   . RA (rheumatoid arthritis) ([place])    a. Followed by Dr. [name]    Past Surgical History:  Procedure Laterality Date  . ABDOMINAL AORTIC ANEURYSM REPAIR    . CARDIAC CATHETERIZATION  2001  . [place]  . CHOLECYSTECTOMY    . COLONOSCOPY    . ESOPHAGOGASTRODUODENOSCOPY (EGD) WITH PROPOFOL N/A 05/08/2020   Procedure: ESOPHAGOGASTRODUODENOSCOPY (EGD) WITH PROPOFOL;  Surgeon: Virgel Manifold, MD;  Location: [facility] ENDOSCOPY;  Service: Endoscopy;  Laterality: N/A;  . FRACTURE SURGERY Right    shoulder replacement  . HEMORRHOID BANDING    . ORIF DISTAL RADIUS FRACTURE Left   . UPPER GASTROINTESTINAL ENDOSCOPY      There were no vitals filed for this visit.   Subjective Assessment - 07/21/20 1456  Subjective Patient reports compliance with HEP. Headaches have been improving but low back has been more aggravated recently. No specific questions or concerns.    Pertinent History Patient has been seen by this therapist in the past for back related injuries/pain. Was last seen by this author in December of 2019. Since discharge patient has had COVID and an upper GI due to stomach issues. PMH includes aorta type A dissection (2011) with aortic valve replacement, bypass of R Coronary artery, hypertension, hyperlipidemia, CHF, pre-diabetes, AFIB, anxiety, depression, arthritis, GERD, atypical migraine, asthma, allergic rhinitis. 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 Does now have help with aide.    Limitations  Sitting;Reading;Lifting;Standing;Walking;Writing;Other (comment);House hold activities    How long can you sit comfortably? 20 mins before whole body discomfort    How long can you stand comfortably? 5 mins  or less: lower back    How long can you walk comfortably? 2 minutes    Patient Stated Goals to decrease pain and migraines    Currently in Pain? Yes    Pain Score 3     Pain Location Back    Pain Orientation Lower    Pain Descriptors / Indicators Aching    Pain Type Chronic pain    Pain Onset More than a month ago    Pain Frequency Intermittent             TREATMENT   Trigger Point Dry Needling (TDN), unbilled Education performed with patient regarding potential benefit of TDN. Previously educated about risks associated with TDN. Pt provided verbal consent to treatment. TDN performed to bilateral upper traps, bilateral levator scapulae, and bilateral suboccipitals with 1, 0.25 x 40 single needle placements in each muscle on each side. Local twitch response (LTR) noted in L upper trap with pt reporting deep ache in bilateral levator scapulae and suboccipitals. Pt also reports reproduction of scalp/headache pain during multiple placements. Pistoning technique utilized with some longer static holds in suboccipitals. Also performed TDN to lumbar multifidi at L3 and L4 with 1, 0.35 x 75 single needle placement on each side at each level (4 total).    Manual Therapy  Prone:  CPAs and UPAs grade II-III mobilizations to C2-C6 20s/bout x 2 bouts/level; UPA grade II mobilizations to C1 20s/bout x 2 bouts on the left; CPA grade II-III mobilizations to L1-L5, 20s/bout x 2 bout/level;  UPA grade II-III mobilizations to L1-L5, 20s/bout x 1 bout/level on both sides;  STM with focus on levator scap, upper trap, cervical paraspinals especially on the L side. Also utilized STM to R lumbar spine. Multiple trigger points requiring release in both lumbar spine and neck   Pt educated throughout  session about proper posture and technique with exercises. Improved exercise technique, movement at target joints, use of target muscles after min to mod verbal, visual, tactile cues.     Patient is reporting improvement with respect to her headaches. She is reporting increased irritation recently related to her low back pain. Trigger point dry needling performed today with patient to bilateral upper traps, bilateral levator scapulae, bilateral suboccipitals, and bilateral lumbar multifidi. Also utilized mobilizations to cervical and lumbar spine as well as STM.  Pt encouraged to continue HEP and follow-up as scheduled. Patient will continue to benefit from skilled physical therapy to reduce pain, improve spinal mobility, and allow patient better quality of life.  PT Short Term Goals - 07/09/20 1252      PT SHORT TERM GOAL #1   Title Patient will be independent in home exercise program to improve strength/mobility and decrease pain for better functional independence with ADLs.    Baseline 5/26: HEP next session 7/14; HEP compliant    Time 2    Period Weeks    Status Achieved    Target Date 05/28/20      PT SHORT TERM GOAL #2   Title Patient will report a worst pain of 7/10 on VAS in head to improve tolerance with ADLs and reduced symptoms with activities.    Baseline 5/26: 9/10 7/14: 7/10    Time 2    Period Weeks    Status Achieved    Target Date 05/28/20      PT SHORT TERM GOAL #3   Title Patient will report a worst pain of 6/10 on VAS in thoracic and lumbar spine to improve tolerance with ADLs and reduced symptoms with activities.    Baseline 6/29: 8/10 pain 7/14: 8/10    Time 2    Period Weeks    Status On-going    Target Date 07/23/20             PT Long Term Goals - 07/09/20 1252      PT LONG TERM GOAL #1   Title Patient will report a worst pain of 3/10 on VAS in her head/migraines to improve tolerance with ADLs  and reduced symptoms with activities    Baseline 5/26: 9/10 7/14: 7/10    Time 8    Period Weeks    Status Partially Met    Target Date 09/02/20      PT LONG TERM GOAL #2   Title Patient will increase FOTO score to equal to or greater than   45/100  to demonstrate statistically significant improvement in mobility and quality of life.    Baseline 5/26: 44 with risk adjusted 33/100 7/14: 38.7%    Time 8    Period Weeks    Status On-going    Target Date 09/02/20      PT LONG TERM GOAL #3   Title Patient will reduce dizziness handicap inventory score to <30, for less dizziness with ADLs and increased safety with home and work tasks.    Baseline 5/26: 40 7/14: 34%    Time 8    Period Weeks    Status Partially Met    Target Date 09/02/20      PT LONG TERM GOAL #4   Title Patient will improve cervical rotation ROM to >/=60 degrees bilaterally to improve functional mobility and demonstrate improved muscle tissue length    Baseline 5/26: R 46 L 42 7/14: assess next session 7/21: R 51 L 49    Time 8    Period Weeks    Status Partially Met    Target Date 09/02/20      PT LONG TERM GOAL #5   Title Patient will report a worst pain of 3/10 on VAS in  thoracic and lumbar spine    to improve tolerance with ADLs and reduced symptoms with activities and caregiver duties    Baseline 6/29: 8/10 7/13: 8/10    Time 8    Period Weeks    Status On-going    Target Date 09/02/20      PT LONG TERM GOAL #6   Title Patient will tolerate sitting unsupported demonstrating erect sitting posture with minimal thoracic kyphosis  for 15+ minutes with maximum of 5/10 back pain to demonstrate improved back extensor strength and improved sitting tolerance.    Baseline 6/29: painful and limited 7/14: can sit 15 minutes but has increasing forward head posture with prolonged hold    Time 8    Period Weeks    Status Partially Met    Target Date 09/02/20      PT LONG TERM GOAL #7   Title Patient will reduce  modified Oswestry score to <20 as to demonstrate minimal disability with ADLs including improved sleeping tolerance, walking/sitting tolerance etc for better mobility with ADLs.    Baseline 6/29: 46% 7/13: 49%    Time 8    Period Weeks    Status Partially Met    Target Date 09/02/20                 Plan - 07/22/20 1001    Clinical Impression Statement Patient is reporting improvement with respect to her headaches. She is reporting increased irritation recently related to her low back pain. Trigger point dry needling performed today with patient to bilateral upper traps, bilateral levator scapulae, bilateral suboccipitals, and bilateral lumbar multifidi. Also utilized mobilizations to cervical and lumbar spine as well as STM.  Pt encouraged to continue HEP and follow-up as scheduled. Patient will continue to benefit from skilled physical therapy to reduce pain, improve spinal mobility, and allow patient better quality of life.    Personal Factors and Comorbidities Age;Comorbidity 3+;Fitness;Finances;Past/Current Experience;Social Background;Time since onset of injury/illness/exacerbation;Other    Comorbidities aorta type A dissection (2011) with aortic valve replacement, bypass of R Coronary artery, hypertension, hyperlipidemia, CHF, pre-diabetes, AFIB, anxiety, depression, arthritis, GERD, atypical migraine, asthma, allergic rhinitis.    Examination-Activity Limitations [place];Caring for Others;Carry;Dressing;Stairs;Squat;Sit;Reach Overhead;Locomotion Level;Lift;Stand;Toileting;Transfers    Examination-Participation Restrictions Church;Cleaning;Community Activity;Interpersonal Relationship;Driving;Laundry;Volunteer;Shop;Personal Finances;Meal Prep;Medication Management;Yard Work    Product manager    Rehab Potential Fair    Clinical Impairments Affecting Rehab Potential + CAD testing, history of AAA, anemia, anxiety, asthma, CAD, chronic combined  ystolic heart failure, GERD, Marfan syndrome, OA, RA (+) good work Psychologist, forensic, motivated, age     PT Frequency 2x / week    PT Duration 8 weeks    PT Treatment/Interventions ADLs/Self Care Home Management;Aquatic Therapy;Vestibular;Vasopneumatic Device;Taping;Passive range of motion;Dry needling;Energy conservation;Manual techniques;Therapeutic exercise;Balance training;Neuromuscular re-education;Patient/family education;Therapeutic activities;Functional mobility training;Stair training;Gait training;Electrical Stimulation;Iontophoresis 66m/ml Dexamethasone;Moist Heat;Traction;Ultrasound;Cryotherapy;Canalith Repostioning;Joint Manipulations;Spinal Manipulations    PT Next Visit Plan back interventions; posture    PT Home Exercise Plan Educated in seated Lev Scap and U/T stretches for HEP, as well as scap pinches and chin tucks.    Consulted and Agree with Plan of Care Patient           Patient will benefit from skilled therapeutic intervention in order to improve the following deficits and impairments:  Cardiopulmonary status limiting activity, Decreased activity tolerance, Decreased endurance, Decreased mobility, Decreased range of motion, Decreased strength, Difficulty walking, Dizziness, Hypomobility, Impaired flexibility, Impaired perceived functional ability, Increased muscle spasms, Impaired UE functional use, Impaired vision/preception, Obesity, Postural dysfunction, Improper body mechanics, Pain, Abnormal gait  Visit Diagnosis: Cervicalgia  Chronic low back pain, unspecified back pain laterality, unspecified whether sciatica present     Problem List Patient Active Problem List   Diagnosis Date Noted  . Acute bronchitis with bronchospasm 05/28/2020  . Diarrhea 04/11/2020  . Epigastric pain 02/19/2020  . Dark stools 02/19/2020  . PAF (paroxysmal atrial fibrillation) ([place] 02/05/2019  . Shortness of breath 01/27/2019  .  Irregular surface of cornea 12/25/2018  . HPV (human papilloma  virus) infection 12/01/2016  . Screening mammogram, encounter for 11/29/2016  . Estrogen deficiency 11/29/2016  . Encounter for routine gynecological examination 11/29/2016  . Grade III hemorrhoids 10/06/2016  . Tachycardia 08/14/2016  . Hemorrhoids 08/13/2016  . Atypical migraine 08/03/2016  . CAD (coronary artery disease)   . AAA (abdominal aortic aneurysm) ([place])   . Chronic combined systolic (congestive) and diastolic (congestive) heart failure ([place])   . Prediabetes 02/11/2016  . Generalized anxiety disorder 12/08/2015  . Panic attacks 07/28/2015  . Sensorineural hearing loss 03/27/2013  . Vitamin D deficiency 07/03/2012  . H/O aortic dissection 05/12/2012  . Class 2 obesity due to excess calories with body mass index (BMI) of 35.0 to 35.9 in adult 11/21/2011  . Obesity (BMI 30.0-34.9) 11/21/2011  . Routine general medical examination at a health care facility 09/20/2011  . S/P AVR (aortic valve replacement) 01/07/2011  . CORONARY ARTERY BYPASS GRAFT, HX OF 01/07/2011  . Arthritis 12/20/2010  . Back pain 12/15/2010  . Dissection of thoracic aorta ([place]) 12/11/2010  . H/O dissecting abdominal aortic aneurysm repair 08/24/2010  . IRRITABLE BOWEL SYNDROME 09/09/2009  . Obstructive sleep apnea 08/04/2009  . External hemorrhoid 06/26/2009  . Osteoporosis 01/09/2009  . Hyperlipidemia 07/26/2007  . Depression with anxiety 07/26/2007  . Essential hypertension 07/26/2007  . Allergic rhinitis 07/26/2007  . Asthma 07/26/2007  . GERD 07/26/2007  . Osteoarthritis 07/26/2007  . Ocular migraine 07/26/2007  . Mild intermittent asthma without complication 01/65/8006   Phillips Grout PT, DPT, GCS  Lamyah Creed 07/22/2020, 10:02 AM  [place] MAIN [facility] SERVICES [address] [place], [place], [address] Phone: [phone]   Fax:  [phone]  Name: Rachel Torres MRN: [id number] Date of Birth: 1956/07/30

## 2020-07-23 ENCOUNTER — Encounter (INDEPENDENT_AMBULATORY_CARE_PROVIDER_SITE_OTHER): Payer: Self-pay | Admitting: Family Medicine

## 2020-07-23 ENCOUNTER — Ambulatory Visit (INDEPENDENT_AMBULATORY_CARE_PROVIDER_SITE_OTHER): Payer: Medicare HMO | Admitting: Family Medicine

## 2020-07-23 ENCOUNTER — Ambulatory Visit: Payer: Medicare HMO

## 2020-07-23 ENCOUNTER — Other Ambulatory Visit: Payer: Self-pay

## 2020-07-23 VITALS — BP 119/76 | HR 75 | Temp 98.1°F | Ht 65.0 in | Wt 197.0 lb

## 2020-07-23 DIAGNOSIS — E1169 Type 2 diabetes mellitus with other specified complication: Secondary | ICD-10-CM

## 2020-07-23 DIAGNOSIS — M542 Cervicalgia: Secondary | ICD-10-CM

## 2020-07-23 DIAGNOSIS — Z9189 Other specified personal risk factors, not elsewhere classified: Secondary | ICD-10-CM | POA: Diagnosis not present

## 2020-07-23 DIAGNOSIS — E669 Obesity, unspecified: Secondary | ICD-10-CM

## 2020-07-23 DIAGNOSIS — M545 Low back pain, unspecified: Secondary | ICD-10-CM

## 2020-07-23 DIAGNOSIS — E559 Vitamin D deficiency, unspecified: Secondary | ICD-10-CM | POA: Diagnosis not present

## 2020-07-23 DIAGNOSIS — Z6832 Body mass index (BMI) 32.0-32.9, adult: Secondary | ICD-10-CM

## 2020-07-23 DIAGNOSIS — E66811 Obesity, class 1: Secondary | ICD-10-CM

## 2020-07-23 DIAGNOSIS — E7849 Other hyperlipidemia: Secondary | ICD-10-CM | POA: Diagnosis not present

## 2020-07-23 DIAGNOSIS — G8929 Other chronic pain: Secondary | ICD-10-CM | POA: Diagnosis not present

## 2020-07-23 DIAGNOSIS — M546 Pain in thoracic spine: Secondary | ICD-10-CM | POA: Diagnosis not present

## 2020-07-23 DIAGNOSIS — M5412 Radiculopathy, cervical region: Secondary | ICD-10-CM | POA: Diagnosis not present

## 2020-07-23 DIAGNOSIS — R293 Abnormal posture: Secondary | ICD-10-CM | POA: Diagnosis not present

## 2020-07-23 NOTE — Therapy (Addendum)
[place] MAIN [facility] SERVICES [address] [place], [place], [address] Phone: [phone]   Fax:  [phone]  Physical Therapy Treatment  Patient Details  Name: Rachel Torres MRN: [id number] Date of Birth: 02/13/1956 Referring Provider (PT): Glori Bickers, Wynelle Fanny MD   Encounter Date: 07/23/2020     Past Medical History:  Diagnosis Date  . AAA (abdominal aortic aneurysm) ([place])    a. Chronic w/o evidence of aneurysmal dil on CTA 01/2016.  Marland Kitchen Alcohol abuse   . Allergy   . Anemia   . Anxiety   . CAD (coronary artery disease)    a. 08/2010 s/p CABG x 1 (VG->RCA) @ time of Ao dissection repair; b. 01/2016 Lexiscan MV: mid antsept/apical defect w/ ? peri-infarct ischemia-->likely attenuation-->Med Rx.  . Chewing difficulty   . Chronic combined systolic (congestive) and diastolic (congestive) heart failure ([place])    a. 2012 EF 30-35%; b. 04/2015 EF 40-45%; c. 01/2016 Echo: Ef 55-65%, nl AoV bioprosthesis, nl RV, nl PASP.  Marland Kitchen Constipation   . Depression   . Edema of both lower extremities   . Gallbladder sludge   . GERD (gastroesophageal reflux disease)   . Heart failure ([place])   . Heart valve problem   . History of Bicuspid Aortic Valve    a. 08/2010 s/p AVR @ time of Ao dissection repair; b. 01/2016 Echo: Ef 55-65%, nl AoV bioprosthesis, nl RV, nl PASP.  Marland Kitchen Hx of repair of dissecting thoracic aortic aneurysm, [place] type A    a. 08/2010 s/p repair with AVR and VG->RCA; b. 01/2016 CTA: stable appearance of Asc Thoracic Aortic graft. Opacification of flase lumen of chronic abd Ao dissection w/ retrograde flow through lumbar arteries. No aneurysmal dil of flase lumen.  . Hyperlipidemia   . Hypertensive heart disease   . IBS (irritable bowel syndrome)   . Internal hemorrhoids   . Kidney problem   . Marfan syndrome    pt denies  . Migraine   . Obstructive sleep apnea   . Osteoarthritis   . Osteoporosis   . Palpitations   . Pneumonia   . RA (rheumatoid  arthritis) ([place])    a. Followed by Dr. [name]    Past Surgical History:  Procedure Laterality Date  . ABDOMINAL AORTIC ANEURYSM REPAIR    . CARDIAC CATHETERIZATION  2001  . [place]  . CHOLECYSTECTOMY    . COLONOSCOPY    . ESOPHAGOGASTRODUODENOSCOPY (EGD) WITH PROPOFOL N/A 05/08/2020   Procedure: ESOPHAGOGASTRODUODENOSCOPY (EGD) WITH PROPOFOL;  Surgeon: Virgel Manifold, MD;  Location: [facility] ENDOSCOPY;  Service: Endoscopy;  Laterality: N/A;  . FRACTURE SURGERY Right    shoulder replacement  . HEMORRHOID BANDING    . ORIF DISTAL RADIUS FRACTURE Left   . UPPER GASTROINTESTINAL ENDOSCOPY      There were no vitals filed for this visit.        TREATMENT   Ther-ex  Ant/post pelvic tilt 5 second holds x 15; Hooklying marches with coordinated exhale for TrA activation x 15 BLE; Hooklying alternating hip fall outs with coordinated exhale for TrA activation x 15 BLE; Supine PVC pipe holds (90 shoulder flexion, elbows extended) with therapist providing upward/downward perturbations for core stability; Supine PVC pipe holds (90 shoulder flexion, elbows extended) with therapist providing right/left perturbations for core stability; Supine PVC pipe holds (90 shoulder flexion, elbows extended) with hips/knees starting at 90/90 position and pt performing bicycles x 7 on each sidel   Manual  Therapy  Prone:  CPAs and UPAs grade II-III mobilizations to C2-C6 20s/bout x 2 bouts/level; UPA grade II mobilizations to C1 20s/bout x 2 bouts bilaterally; CPA grade II-III mobilizations to L1-L5, 20s/bout x 2 bout/level;  STM with focus on levator scap, upper trap, and cervical paraspinals bilaterally. Also utilized STM to bilateral lumbar spine. Multiple trigger points requiring release in both lumbar spine and neck   Pt educated throughout session about proper posture and technique with exercises. Improved exercise technique, movement at target joints, use of target  muscles after min to mod verbal, visual, tactile cues.     Patient is reporting improvement with respect to her headaches. Her back pain is also minimal today. She states that she would like to avoid TDN today and instead continue with more strengthening. Continued to utilize spinal mobilizations to cervical and lumbar spine as well as STM. She reports intermittent lumbar spasm during strengthening. Pt encouraged to continue HEP and follow-up as scheduled. Patient will continue to benefit from skilled physical therapy to reduce pain, improve spinal mobility, and allow patient better quality of life.                          PT Short Term Goals - 07/09/20 1252      PT SHORT TERM GOAL #1   Title Patient will be independent in home exercise program to improve strength/mobility and decrease pain for better functional independence with ADLs.    Baseline 5/26: HEP next session 7/14; HEP compliant    Time 2    Period Weeks    Status Achieved    Target Date 05/28/20      PT SHORT TERM GOAL #2   Title Patient will report a worst pain of 7/10 on VAS in head to improve tolerance with ADLs and reduced symptoms with activities.    Baseline 5/26: 9/10 7/14: 7/10    Time 2    Period Weeks    Status Achieved    Target Date 05/28/20      PT SHORT TERM GOAL #3   Title Patient will report a worst pain of 6/10 on VAS in thoracic and lumbar spine to improve tolerance with ADLs and reduced symptoms with activities.    Baseline 6/29: 8/10 pain 7/14: 8/10    Time 2    Period Weeks    Status On-going    Target Date 07/23/20             PT Long Term Goals - 07/09/20 1252      PT LONG TERM GOAL #1   Title Patient will report a worst pain of 3/10 on VAS in her head/migraines to improve tolerance with ADLs and reduced symptoms with activities    Baseline 5/26: 9/10 7/14: 7/10    Time 8    Period Weeks    Status Partially Met    Target Date 09/02/20      PT LONG TERM GOAL #2    Title Patient will increase FOTO score to equal to or greater than   45/100  to demonstrate statistically significant improvement in mobility and quality of life.    Baseline 5/26: 44 with risk adjusted 33/100 7/14: 38.7%    Time 8    Period Weeks    Status On-going    Target Date 09/02/20      PT LONG TERM GOAL #3   Title Patient will reduce dizziness handicap inventory score to <30, for less  dizziness with ADLs and increased safety with home and work tasks.    Baseline 5/26: 40 7/14: 34%    Time 8    Period Weeks    Status Partially Met    Target Date 09/02/20      PT LONG TERM GOAL #4   Title Patient will improve cervical rotation ROM to >/=60 degrees bilaterally to improve functional mobility and demonstrate improved muscle tissue length    Baseline 5/26: R 46 L 42 7/14: assess next session 7/21: R 51 L 49    Time 8    Period Weeks    Status Partially Met    Target Date 09/02/20      PT LONG TERM GOAL #5   Title Patient will report a worst pain of 3/10 on VAS in  thoracic and lumbar spine    to improve tolerance with ADLs and reduced symptoms with activities and caregiver duties    Baseline 6/29: 8/10 7/13: 8/10    Time 8    Period Weeks    Status On-going    Target Date 09/02/20      PT LONG TERM GOAL #6   Title Patient will tolerate sitting unsupported demonstrating erect sitting posture with minimal thoracic kyphosis for 15+ minutes with maximum of 5/10 back pain to demonstrate improved back extensor strength and improved sitting tolerance.    Baseline 6/29: painful and limited 7/14: can sit 15 minutes but has increasing forward head posture with prolonged hold    Time 8    Period Weeks    Status Partially Met    Target Date 09/02/20      PT LONG TERM GOAL #7   Title Patient will reduce modified Oswestry score to <20 as to demonstrate minimal disability with ADLs including improved sleeping tolerance, walking/sitting tolerance etc for better mobility with ADLs.     Baseline 6/29: 46% 7/13: 49%    Time 8    Period Weeks    Status Partially Met    Target Date 09/02/20               Patient will benefit from skilled therapeutic intervention in order to improve the following deficits and impairments:  Cardiopulmonary status limiting activity, Decreased activity tolerance, Decreased endurance, Decreased mobility, Decreased range of motion, Decreased strength, Difficulty walking, Dizziness, Hypomobility, Impaired flexibility, Impaired perceived functional ability, Increased muscle spasms, Impaired UE functional use, Impaired vision/preception, Obesity, Postural dysfunction, Improper body mechanics, Pain, Abnormal gait  Visit Diagnosis: Cervicalgia  Chronic low back pain, unspecified back pain laterality, unspecified whether sciatica present     Problem List Patient Active Problem List   Diagnosis Date Noted  . Acute bronchitis with bronchospasm 05/28/2020  . Diarrhea 04/11/2020  . Epigastric pain 02/19/2020  . Dark stools 02/19/2020  . PAF (paroxysmal atrial fibrillation) ([place]) 02/05/2019  . Shortness of breath 01/27/2019  . Irregular surface of cornea 12/25/2018  . HPV (human papilloma virus) infection 12/01/2016  . Screening mammogram, encounter for 11/29/2016  . Estrogen deficiency 11/29/2016  . Encounter for routine gynecological examination 11/29/2016  . Grade III hemorrhoids 10/06/2016  . Tachycardia 08/14/2016  . Hemorrhoids 08/13/2016  . Atypical migraine 08/03/2016  . CAD (coronary artery disease)   . AAA (abdominal aortic aneurysm) ([place])   . Chronic combined systolic (congestive) and diastolic (congestive) heart failure ([place])   . Prediabetes 02/11/2016  . Generalized anxiety disorder 12/08/2015  . Panic attacks 07/28/2015  . Sensorineural hearing loss 03/27/2013  . Vitamin D deficiency 07/03/2012  .  H/O aortic dissection 05/12/2012  . Class 2 obesity due to excess calories with body mass index (BMI) of 35.0 to 35.9 in  adult 11/21/2011  . Obesity (BMI 30.0-34.9) 11/21/2011  . Routine general medical examination at a health care facility 09/20/2011  . S/P AVR (aortic valve replacement) 01/07/2011  . CORONARY ARTERY BYPASS GRAFT, HX OF 01/07/2011  . Arthritis 12/20/2010  . Back pain 12/15/2010  . Dissection of thoracic aorta ([place]) 12/11/2010  . H/O dissecting abdominal aortic aneurysm repair 08/24/2010  . IRRITABLE BOWEL SYNDROME 09/09/2009  . Obstructive sleep apnea 08/04/2009  . External hemorrhoid 06/26/2009  . Osteoporosis 01/09/2009  . Hyperlipidemia 07/26/2007  . Depression with anxiety 07/26/2007  . Essential hypertension 07/26/2007  . Allergic rhinitis 07/26/2007  . Asthma 07/26/2007  . GERD 07/26/2007  . Osteoarthritis 07/26/2007  . Ocular migraine 07/26/2007  . Mild intermittent asthma without complication 21/30/8657     Phillips Grout PT, DPT, GCS  Huprich,Jason 07/29/2020, 4:34 PM  [place] MAIN [name] SERVICES [address] [place], [place], [address] Phone: [phone]   Fax:  [phone]  Name: Rachel Torres MRN: [id number] Date of Birth: Apr 15, 1956

## 2020-07-23 NOTE — Progress Notes (Signed)
Chief Complaint:   OBESITY Rachel Torres is here to discuss her progress with her obesity treatment plan along with follow-up of her obesity related diagnoses. Rachel Torres is on following a lower carbohydrate, vegetable and lean protein rich diet plan and states she is following her eating plan approximately 90-95% of the time. Rachel Torres states she is physical therapy for 45 minutes 2 times per week.  Today's visit was #: 5 Starting weight: 213 lbs Starting date: 05/05/2020 Today's weight: 197 lbs Today's date: 07/23/2020 Total lbs lost to date: 16 Total lbs lost since last in-office visit: 3  Interim History: Rachel Torres continues to do well with weight loss. She changed to the Low carbohydrate plan and she notes her hunger and cravings are controlled.  Subjective:   1. Type 2 diabetes mellitus with other specified complication, without long-term current use of insulin (HCC) Rachel Torres's last A1c was well controlled at 5.9. She is doing very well with diet and weight loss. She is due for labs.  2. Vitamin D deficiency Rachel Torres is stable on Vit D, and she denies nausea, vomiting, or muscle weakness.  3. Other hyperlipidemia Rachel Torres is stable on her diet and Lipitor, and she denies chest pain or myalgias.  4. At risk for heart disease Rachel Torres is at a higher than average risk for cardiovascular disease due to obesity.   Assessment/Plan:   1. Type 2 diabetes mellitus with other specified complication, without long-term current use of insulin (HCC) Good blood sugar control is important to decrease the likelihood of diabetic complications such as nephropathy, neuropathy, limb loss, blindness, coronary artery disease, and death. Intensive lifestyle modification including diet, exercise and weight loss are the first line of treatment for diabetes. We will check labs today, and Rachel Torres will follow up as directed.  - Comprehensive metabolic panel - Hemoglobin A1c - Insulin, random  2. Vitamin D  deficiency Low Vitamin D level contributes to fatigue and are associated with obesity, breast, and colon cancer. We will check labs today. Rachel Torres agreed to continue taking Vitamin D 5,000 IU daily and will follow-up for routine testing of Vitamin D, at least 2-3 times per year to avoid over-replacement.  - VITAMIN D 25 Hydroxy (Vit-D Deficiency, Fractures)  3. Other hyperlipidemia Cardiovascular risk and specific lipid/LDL goals reviewed. We discussed several lifestyle modifications today. Rachel Torres will continue to work on diet, exercise and weight loss efforts. We will check labs today. Orders and follow up as documented in patient record.   - Comprehensive metabolic panel - Lipid Panel With LDL/HDL Ratio  4. At risk for heart disease Rachel Torres was given approximately 15 minutes of coronary artery disease prevention counseling today. She is 65 y.o. female and has risk factors for heart disease including obesity. We discussed intensive lifestyle modifications today with an emphasis on specific weight loss instructions and strategies.   Repetitive spaced learning was employed today to elicit superior memory formation and behavioral change.  5. Class 1 obesity with serious comorbidity and body mass index (BMI) of 32.0 to 32.9 in adult, unspecified obesity type Rachel Torres is currently in the action stage of change. As such, her goal is to continue with weight loss efforts. She has agreed to following a lower carbohydrate, vegetable and lean protein rich diet plan.   Exercise goals: As is.  Behavioral modification strategies: increasing lean protein intake.  Rachel Torres has agreed to follow-up with our clinic in 2 to 3 weeks. She was informed of the importance of frequent follow-up visits to maximize her  success with intensive lifestyle modifications for her multiple health conditions.   Rachel Torres was informed we would discuss her lab results at her next visit unless there is a critical issue that needs to  be addressed sooner. Rachel Torres agreed to keep her next visit at the agreed upon time to discuss these results.  Objective:   Blood pressure 119/76, pulse 75, temperature 98.1 F (36.7 C), temperature source Oral, height 5\' 5"  (1.651 m), weight 197 lb (89.4 kg), SpO2 97 %. Body mass index is 32.78 kg/m.  General: Cooperative, alert, well developed, in no acute distress. HEENT: Conjunctivae and lids unremarkable. Cardiovascular: Regular rhythm.  Lungs: Normal work of breathing. Neurologic: No focal deficits.   Lab Results  Component Value Date   CREATININE 0.91 02/19/2020   BUN 12 02/19/2020   NA 140 02/19/2020   K 4.1 02/19/2020   CL 102 02/19/2020   CO2 31 02/19/2020   Lab Results  Component Value Date   ALT 23 02/19/2020   AST 22 02/19/2020   ALKPHOS 67 02/19/2020   BILITOT 0.6 02/19/2020   Lab Results  Component Value Date   HGBA1C 5.9 (H) 05/05/2020   HGBA1C 6.0 06/07/2019   HGBA1C 6.1 06/26/2018   HGBA1C 6.0 11/24/2017   HGBA1C 5.5 11/23/2016   Lab Results  Component Value Date   INSULIN 25.2 (H) 05/05/2020   Lab Results  Component Value Date   TSH 1.040 05/05/2020   Lab Results  Component Value Date   CHOL 195 05/05/2020   HDL 56 05/05/2020   LDLCALC 116 (H) 05/05/2020   LDLDIRECT 189.4 10/13/2011   TRIG 133 05/05/2020   CHOLHDL 3 06/07/2019   Lab Results  Component Value Date   WBC 5.8 02/19/2020   HGB 13.0 02/19/2020   HCT 38.5 02/19/2020   MCV 95.4 02/19/2020   PLT 284.0 02/19/2020   Lab Results  Component Value Date   FERRITIN 105.0 12/11/2010   Attestation Statements:   Reviewed by clinician on day of visit: allergies, medications, problem list, medical history, surgical history, family history, social history, and previous encounter notes.   I, Trixie Dredge, am acting as transcriptionist for [name], MD.  I have reviewed the above documentation for accuracy and completeness, and I agree with the above. -  [name],  MD

## 2020-07-24 LAB — COMPREHENSIVE METABOLIC PANEL
ALT: 20 IU/L (ref 0–32)
AST: 22 IU/L (ref 0–40)
Albumin/Globulin Ratio: 1.7 (ref 1.2–2.2)
Albumin: 4.7 g/dL (ref 3.8–4.8)
Alkaline Phosphatase: 82 IU/L (ref 48–121)
BUN/Creatinine Ratio: 20 (ref 12–28)
BUN: 18 mg/dL (ref 8–27)
Bilirubin Total: 0.3 mg/dL (ref 0.0–1.2)
CO2: 25 mmol/L (ref 20–29)
Calcium: 10.3 mg/dL (ref 8.7–10.3)
Chloride: 100 mmol/L (ref 96–106)
Creatinine, Ser: 0.91 mg/dL (ref 0.57–1.00)
GFR calc Af Amer: 78 mL/min/{1.73_m2} (ref >59)
GFR calc non Af Amer: 68 mL/min/{1.73_m2} (ref >59)
Globulin, Total: 2.7 g/dL (ref 1.5–4.5)
Glucose: 101 mg/dL — ABNORMAL HIGH (ref 65–99)
Potassium: 4.6 mmol/L (ref 3.5–5.2)
Sodium: 139 mmol/L (ref 134–144)
Total Protein: 7.4 g/dL (ref 6.0–8.5)

## 2020-07-24 LAB — LIPID PANEL WITH LDL/HDL RATIO
Cholesterol, Total: 198 mg/dL (ref 100–199)
HDL: 52 mg/dL (ref >39)
LDL Chol Calc (NIH): 122 mg/dL — ABNORMAL HIGH (ref 0–99)
LDL/HDL Ratio: 2.3 ratio (ref 0.0–3.2)
Triglycerides: 136 mg/dL (ref 0–149)
VLDL Cholesterol Cal: 24 mg/dL (ref 5–40)

## 2020-07-24 LAB — VITAMIN D 25 HYDROXY (VIT D DEFICIENCY, FRACTURES): Vit D, 25-Hydroxy: 60.7 ng/mL (ref 30.0–100.0)

## 2020-07-24 LAB — HEMOGLOBIN A1C
Est. average glucose Bld gHb Est-mCnc: 114 mg/dL
Hgb A1c MFr Bld: 5.6 % (ref 4.8–5.6)

## 2020-07-24 LAB — INSULIN, RANDOM: INSULIN: 22.5 u[IU]/mL (ref 2.6–24.9)

## 2020-07-29 ENCOUNTER — Other Ambulatory Visit: Payer: Self-pay

## 2020-07-29 ENCOUNTER — Ambulatory Visit: Payer: Medicare HMO

## 2020-07-29 DIAGNOSIS — G8929 Other chronic pain: Secondary | ICD-10-CM | POA: Diagnosis not present

## 2020-07-29 DIAGNOSIS — M5412 Radiculopathy, cervical region: Secondary | ICD-10-CM | POA: Diagnosis not present

## 2020-07-29 DIAGNOSIS — R293 Abnormal posture: Secondary | ICD-10-CM | POA: Diagnosis not present

## 2020-07-29 DIAGNOSIS — M542 Cervicalgia: Secondary | ICD-10-CM

## 2020-07-29 DIAGNOSIS — M546 Pain in thoracic spine: Secondary | ICD-10-CM | POA: Diagnosis not present

## 2020-07-29 DIAGNOSIS — M545 Low back pain: Secondary | ICD-10-CM | POA: Diagnosis not present

## 2020-07-29 NOTE — Therapy (Signed)
[place] [facility]IN [facility] SERVICES [address] , [place], [address] Phone: [phone]   Fax:  [phone]  Physical Therapy Treatment  Patient Details  Name: Rachel Torres MRN: [id number] Date of Birth: 12/11/1956 Referring Provider (PT): Glori Bickers, Wynelle Fanny MD   Encounter Date: 07/29/2020   PT End of Session - 07/29/20 1631    Visit Number 17    Number of Visits 28    Date for PT Re-Evaluation 09/02/20    Authorization Type 5/10 07/01/20    Authorization Time Period back pain added to POC on 06/17/20    PT Start Time 1520    PT Stop Time 1600    PT Time Calculation (min) 40 min    Activity Tolerance Patient tolerated treatment well    Behavior During Therapy [facility] for tasks assessed/performed           Past Medical History:  Diagnosis Date  . AAA (abdominal aortic aneurysm) ([place])    a. Chronic w/o evidence of aneurysmal dil on CTA 01/2016.  Marland Kitchen Alcohol abuse   . Allergy   . Anemia   . Anxiety   . CAD (coronary artery disease)    a. 08/2010 s/p CABG x 1 (VG->RCA) @ time of Ao dissection repair; b. 01/2016 Lexiscan MV: mid antsept/apical defect w/ ? peri-infarct ischemia-->likely attenuation-->Med Rx.  . Chewing difficulty   . Chronic combined systolic (congestive) and diastolic (congestive) heart failure ([place])    a. 2012 EF 30-35%; b. 04/2015 EF 40-45%; c. 01/2016 Echo: Ef 55-65%, nl AoV bioprosthesis, nl RV, nl PASP.  Marland Kitchen Constipation   . Depression   . Edema of both lower extremities   . Gallbladder sludge   . GERD (gastroesophageal reflux disease)   . Heart failure ([place])   . Heart valve problem   . History of Bicuspid Aortic Valve    a. 08/2010 s/p AVR @ time of Ao dissection repair; b. 01/2016 Echo: Ef 55-65%, nl AoV bioprosthesis, nl RV, nl PASP.  Marland Kitchen Hx of repair of dissecting thoracic aortic aneurysm, [place] type A    a. 08/2010 s/p repair with AVR and VG->RCA; b. 01/2016 CTA: stable appearance of Asc Thoracic Aortic graft.  Opacification of flase lumen of chronic abd Ao dissection w/ retrograde flow through lumbar arteries. No aneurysmal dil of flase lumen.  . Hyperlipidemia   . Hypertensive heart disease   . IBS (irritable bowel syndrome)   . Internal hemorrhoids   . Kidney problem   . Marfan syndrome    pt denies  . Migraine   . Obstructive sleep apnea   . Osteoarthritis   . Osteoporosis   . Palpitations   . Pneumonia   . RA (rheumatoid arthritis) ([place])    a. Followed by Dr. [name]    Past Surgical History:  Procedure Laterality Date  . ABDOMINAL AORTIC ANEURYSM REPAIR    . CARDIAC CATHETERIZATION  2001  . [place]  . CHOLECYSTECTOMY    . COLONOSCOPY    . ESOPHAGOGASTRODUODENOSCOPY (EGD) WITH PROPOFOL N/A 05/08/2020   Procedure: ESOPHAGOGASTRODUODENOSCOPY (EGD) WITH PROPOFOL;  Surgeon: Virgel Manifold, MD;  Location: [facility] ENDOSCOPY;  Service: Endoscopy;  Laterality: N/A;  . FRACTURE SURGERY Right    shoulder replacement  . HEMORRHOID BANDING    . ORIF DISTAL RADIUS FRACTURE Left   . UPPER GASTROINTESTINAL ENDOSCOPY      There were no vitals filed for this visit.   Subjective Assessment - 07/29/20 1529  Subjective Patient reports she is doing well today. She had a migraine last night but not today. She is not complaining of any resting low back pain upon arrival but back pain increases when sitting. She brought her home TENS unit and wants to learn about how to utilize it for her R low back pain/R hip pain when driving on Friday.    Pertinent History Patient has been seen by this therapist in the past for back related injuries/pain. Was last seen by this author in December of 2019. Since discharge patient has had COVID and an upper GI due to stomach issues. PMH includes aorta type A dissection (2011) with aortic valve replacement, bypass of R Coronary artery, hypertension, hyperlipidemia, CHF, pre-diabetes, AFIB, anxiety, depression, arthritis, GERD, atypical migraine,  asthma, allergic rhinitis. 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 Does now have help with aide.    Limitations Sitting;Reading;Lifting;Standing;Walking;Writing;Other (comment);House hold activities    How long can you sit comfortably? 20 mins before whole body discomfort    How long can you stand comfortably? 5 mins  or less: lower back    How long can you walk comfortably? 2 minutes    Patient Stated Goals to decrease pain and migraines    Currently in Pain? No/denies   No resting headache or LBP. Increase in back pain with sitting             TREATMENT   Manual Therapy  Extensive time spent educating patient about how to use her home TENS unit for her low back and R posterior hip pain in anticipation for her upcoming car trip. Second female therapist present when R posterior hip has to be exposed. Tried different pad positions as well as settings to find patient preferred placement/settings. At pt request photos obtained on her phone with pad placements for future application;  Prone:  CPAs and UPAs grade II-III mobilizations to C2-T1 20s/bout x 2 bouts/level; UPA grade II mobilizations to C1 20s/bout x 2 bouts bilaterally; STM with trigger point release, focus on levator scap, upper trap, and cervical paraspinals bilaterally.    Trigger Point Dry Needling (TDN), unbilled Pt provided verbal consent to treatment. TDN performed to  bilateral suboccipitals with 1, 0.25 x 40 single needle placements on each side. Long hold placement utilized followed by pistoning while therapist proceeded with lower cervical mobilizations. Deep ache reported by patient.   Session focused on extensive education about how to use her home TENS unit for her low back and R posterior hip pain in anticipation for her upcoming car trip. Second female therapist present when R posterior hip has to be exposed. Tried different pad positions as well as settings to find patient preferred placement/settings. At  pt request photos obtained on her phone with pad placements for future application. Pt requests that session focus on neck/headache pain. Utilized TDN today for suboccipitals as well as STM for posterior cervical and upper back musculature. Pt encouraged to continue HEP and follow-up as scheduled. She will continue to benefit from skilled physical therapy to reduce pain, improve spinal mobility, and allow patient better quality of life.                                PT Short Term Goals - 07/09/20 1252      PT SHORT TERM GOAL #1   Title Patient will be independent in home exercise program to improve strength/mobility and decrease pain  for better functional independence with ADLs.    Baseline 5/26: HEP next session 7/14; HEP compliant    Time 2    Period Weeks    Status Achieved    Target Date 05/28/20      PT SHORT TERM GOAL #2   Title Patient will report a worst pain of 7/10 on VAS in head to improve tolerance with ADLs and reduced symptoms with activities.    Baseline 5/26: 9/10 7/14: 7/10    Time 2    Period Weeks    Status Achieved    Target Date 05/28/20      PT SHORT TERM GOAL #3   Title Patient will report a worst pain of 6/10 on VAS in thoracic and lumbar spine to improve tolerance with ADLs and reduced symptoms with activities.    Baseline 6/29: 8/10 pain 7/14: 8/10    Time 2    Period Weeks    Status On-going    Target Date 07/23/20             PT Long Term Goals - 07/09/20 1252      PT LONG TERM GOAL #1   Title Patient will report a worst pain of 3/10 on VAS in her head/migraines to improve tolerance with ADLs and reduced symptoms with activities    Baseline 5/26: 9/10 7/14: 7/10    Time 8    Period Weeks    Status Partially Met    Target Date 09/02/20      PT LONG TERM GOAL #2   Title Patient will increase FOTO score to equal to or greater than   45/100  to demonstrate statistically significant improvement in mobility and quality of  life.    Baseline 5/26: 44 with risk adjusted 33/100 7/14: 38.7%    Time 8    Period Weeks    Status On-going    Target Date 09/02/20      PT LONG TERM GOAL #3   Title Patient will reduce dizziness handicap inventory score to <30, for less dizziness with ADLs and increased safety with home and work tasks.    Baseline 5/26: 40 7/14: 34%    Time 8    Period Weeks    Status Partially Met    Target Date 09/02/20      PT LONG TERM GOAL #4   Title Patient will improve cervical rotation ROM to >/=60 degrees bilaterally to improve functional mobility and demonstrate improved muscle tissue length    Baseline 5/26: R 46 L 42 7/14: assess next session 7/21: R 51 L 49    Time 8    Period Weeks    Status Partially Met    Target Date 09/02/20      PT LONG TERM GOAL #5   Title Patient will report a worst pain of 3/10 on VAS in  thoracic and lumbar spine    to improve tolerance with ADLs and reduced symptoms with activities and caregiver duties    Baseline 6/29: 8/10 7/13: 8/10    Time 8    Period Weeks    Status On-going    Target Date 09/02/20      PT LONG TERM GOAL #6   Title Patient will tolerate sitting unsupported demonstrating erect sitting posture with minimal thoracic kyphosis for 15+ minutes with maximum of 5/10 back pain to demonstrate improved back extensor strength and improved sitting tolerance.    Baseline 6/29: painful and limited 7/14: can sit 15 minutes but has  increasing forward head posture with prolonged hold    Time 8    Period Weeks    Status Partially Met    Target Date 09/02/20      PT LONG TERM GOAL #7   Title Patient will reduce modified Oswestry score to <20 as to demonstrate minimal disability with ADLs including improved sleeping tolerance, walking/sitting tolerance etc for better mobility with ADLs.    Baseline 6/29: 46% 7/13: 49%    Time 8    Period Weeks    Status Partially Met    Target Date 09/02/20                 Plan - 07/29/20 1632     Clinical Impression Statement Session focused on extensive education about how to use her home TENS unit for her low back and R posterior hip pain in anticipation for her upcoming car trip. Second female therapist present when R posterior hip has to be exposed. Tried different pad positions as well as settings to find patient preferred placement/settings. At pt request photos obtained on her phone with pad placements for future application. Pt requests that session focus on neck/headache pain. Utilized TDN today for suboccipitals as well as STM for posterior cervical and upper back musculature. Pt encouraged to continue HEP and follow-up as scheduled. She will continue to benefit from skilled physical therapy to reduce pain, improve spinal mobility, and allow patient better quality of life.    Personal Factors and Comorbidities Age;Comorbidity 3+;Fitness;Finances;Past/Current Experience;Social Background;Time since onset of injury/illness/exacerbation;Other    Comorbidities aorta type A dissection (2011) with aortic valve replacement, bypass of R Coronary artery, hypertension, hyperlipidemia, CHF, pre-diabetes, AFIB, anxiety, depression, arthritis, GERD, atypical migraine, asthma, allergic rhinitis.    Examination-Activity Limitations [place];Caring for Others;Carry;Dressing;Stairs;Squat;Sit;Reach Overhead;Locomotion Level;Lift;Stand;Toileting;Transfers    Examination-Participation Restrictions Church;Cleaning;Community Activity;Interpersonal Relationship;Driving;Laundry;Volunteer;Shop;Personal Finances;Meal Prep;Medication Management;Yard Work    Product manager    Rehab Potential Fair    Clinical Impairments Affecting Rehab Potential + CAD testing, history of AAA, anemia, anxiety, asthma, CAD, chronic combined ystolic heart failure, GERD, Marfan syndrome, OA, RA (+) good work Psychologist, forensic, motivated, age     PT Frequency 2x / week    PT Duration 8 weeks    PT  Treatment/Interventions ADLs/Self Care Home Management;Aquatic Therapy;Vestibular;Vasopneumatic Device;Taping;Passive range of motion;Dry needling;Energy conservation;Manual techniques;Therapeutic exercise;Balance training;Neuromuscular re-education;Patient/family education;Therapeutic activities;Functional mobility training;Stair training;Gait training;Electrical Stimulation;Iontophoresis 7m/ml Dexamethasone;Moist Heat;Traction;Ultrasound;Cryotherapy;Canalith Repostioning;Joint Manipulations;Spinal Manipulations    PT Next Visit Plan back interventions; posture    PT Home Exercise Plan Educated in seated Lev Scap and U/T stretches for HEP, as well as scap pinches and chin tucks.    Consulted and Agree with Plan of Care Patient           Patient will benefit from skilled therapeutic intervention in order to improve the following deficits and impairments:  Cardiopulmonary status limiting activity, Decreased activity tolerance, Decreased endurance, Decreased mobility, Decreased range of motion, Decreased strength, Difficulty walking, Dizziness, Hypomobility, Impaired flexibility, Impaired perceived functional ability, Increased muscle spasms, Impaired UE functional use, Impaired vision/preception, Obesity, Postural dysfunction, Improper body mechanics, Pain, Abnormal gait  Visit Diagnosis: Cervicalgia     Problem List Patient Active Problem List   Diagnosis Date Noted  . Acute bronchitis with bronchospasm 05/28/2020  . Diarrhea 04/11/2020  . Epigastric pain 02/19/2020  . Dark stools 02/19/2020  . PAF (paroxysmal atrial fibrillation) ([place] 02/05/2019  . Shortness of breath 01/27/2019  . Irregular surface of cornea 12/25/2018  . HPV (human papilloma  virus) infection 12/01/2016  . Screening mammogram, encounter for 11/29/2016  . Estrogen deficiency 11/29/2016  . Encounter for routine gynecological examination 11/29/2016  . Grade III hemorrhoids 10/06/2016  . Tachycardia 08/14/2016  .  Hemorrhoids 08/13/2016  . Atypical migraine 08/03/2016  . CAD (coronary artery disease)   . AAA (abdominal aortic aneurysm) ([place])   . Chronic combined systolic (congestive) and diastolic (congestive) heart failure ([place])   . Prediabetes 02/11/2016  . Generalized anxiety disorder 12/08/2015  . Panic attacks 07/28/2015  . Sensorineural hearing loss 03/27/2013  . Vitamin D deficiency 07/03/2012  . H/O aortic dissection 05/12/2012  . Class 2 obesity due to excess calories with body mass index (BMI) of 35.0 to 35.9 in adult 11/21/2011  . Obesity (BMI 30.0-34.9) 11/21/2011  . Routine general medical examination at a health care facility 09/20/2011  . S/P AVR (aortic valve replacement) 01/07/2011  . CORONARY ARTERY BYPASS GRAFT, HX OF 01/07/2011  . Arthritis 12/20/2010  . Back pain 12/15/2010  . Dissection of thoracic aorta ([place]) 12/11/2010  . H/O dissecting abdominal aortic aneurysm repair 08/24/2010  . IRRITABLE BOWEL SYNDROME 09/09/2009  . Obstructive sleep apnea 08/04/2009  . External hemorrhoid 06/26/2009  . Osteoporosis 01/09/2009  . Hyperlipidemia 07/26/2007  . Depression with anxiety 07/26/2007  . Essential hypertension 07/26/2007  . Allergic rhinitis 07/26/2007  . Asthma 07/26/2007  . GERD 07/26/2007  . Osteoarthritis 07/26/2007  . Ocular migraine 07/26/2007  . Mild intermittent asthma without complication [id number]/[id number]   Phillips Grout PT, DPT, GCS  Jersey Espinoza 07/30/2020, 10:52 AM  [place] [facility]IN [facility] SERVICES [address] [place], [place], [address] Phone: [phone]   Fax:  [phone]  Name: Rachel Torres MRN: [id number] Date of Birth: [birth year]

## 2020-07-30 ENCOUNTER — Ambulatory Visit: Payer: Medicare HMO | Admitting: Psychology

## 2020-07-31 ENCOUNTER — Other Ambulatory Visit: Payer: Self-pay

## 2020-07-31 ENCOUNTER — Ambulatory Visit: Payer: Medicare HMO

## 2020-07-31 DIAGNOSIS — G8929 Other chronic pain: Secondary | ICD-10-CM

## 2020-07-31 DIAGNOSIS — M542 Cervicalgia: Secondary | ICD-10-CM

## 2020-07-31 DIAGNOSIS — M546 Pain in thoracic spine: Secondary | ICD-10-CM | POA: Diagnosis not present

## 2020-07-31 DIAGNOSIS — M545 Low back pain, unspecified: Secondary | ICD-10-CM

## 2020-07-31 DIAGNOSIS — R293 Abnormal posture: Secondary | ICD-10-CM | POA: Diagnosis not present

## 2020-07-31 DIAGNOSIS — M5412 Radiculopathy, cervical region: Secondary | ICD-10-CM | POA: Diagnosis not present

## 2020-07-31 NOTE — Therapy (Signed)
[place] MAIN [facility] SERVICES [address] [place], [place], [address] Phone: [phone]   Fax:  [phone]  Physical Therapy Treatment  Patient Details  Name: Rachel Torres MRN: [id number] Date of Birth: 04-11-1956 Referring Provider (PT): Glori Bickers, Wynelle Fanny MD   Encounter Date: 07/31/2020   PT End of Session - 07/31/20 1651    Visit Number 18    Number of Visits 28    Date for PT Re-Evaluation 09/02/20    Authorization Type 5/10 07/01/20    Authorization Time Period back pain added to POC on 06/17/20    PT Start Time 1646    PT Stop Time 1730    PT Time Calculation (min) 44 min    Activity Tolerance Patient tolerated treatment well    Behavior During Therapy [facility] for tasks assessed/performed           Past Medical History:  Diagnosis Date  . AAA (abdominal aortic aneurysm) ([place])    a. Chronic w/o evidence of aneurysmal dil on CTA 01/2016.  Marland Kitchen Alcohol abuse   . Allergy   . Anemia   . Anxiety   . CAD (coronary artery disease)    a. 08/2010 s/p CABG x 1 (VG->RCA) @ time of Ao dissection repair; b. 01/2016 Lexiscan MV: mid antsept/apical defect w/ ? peri-infarct ischemia-->likely attenuation-->Med Rx.  . Chewing difficulty   . Chronic combined systolic (congestive) and diastolic (congestive) heart failure ([place])    a. 2012 EF 30-35%; b. 04/2015 EF 40-45%; c. 01/2016 Echo: Ef 55-65%, nl AoV bioprosthesis, nl RV, nl PASP.  Marland Kitchen Constipation   . Depression   . Edema of both lower extremities   . Gallbladder sludge   . GERD (gastroesophageal reflux disease)   . Heart failure ([place])   . Heart valve problem   . History of Bicuspid Aortic Valve    a. 08/2010 s/p AVR @ time of Ao dissection repair; b. 01/2016 Echo: Ef 55-65%, nl AoV bioprosthesis, nl RV, nl PASP.  Marland Kitchen Hx of repair of dissecting thoracic aortic aneurysm, [place] type A    a. 08/2010 s/p repair with AVR and VG->RCA; b. 01/2016 CTA: stable appearance of Asc Thoracic Aortic graft.  Opacification of flase lumen of chronic abd Ao dissection w/ retrograde flow through lumbar arteries. No aneurysmal dil of flase lumen.  . Hyperlipidemia   . Hypertensive heart disease   . IBS (irritable bowel syndrome)   . Internal hemorrhoids   . Kidney problem   . Marfan syndrome    pt denies  . Migraine   . Obstructive sleep apnea   . Osteoarthritis   . Osteoporosis   . Palpitations   . Pneumonia   . RA (rheumatoid arthritis) ([place])    a. Followed by Dr. [name]    Past Surgical History:  Procedure Laterality Date  . ABDOMINAL AORTIC ANEURYSM REPAIR    . CARDIAC CATHETERIZATION  2001  . [place]  . CHOLECYSTECTOMY    . COLONOSCOPY    . ESOPHAGOGASTRODUODENOSCOPY (EGD) WITH PROPOFOL N/A 05/08/2020   Procedure: ESOPHAGOGASTRODUODENOSCOPY (EGD) WITH PROPOFOL;  Surgeon: Virgel Manifold, MD;  Location: [facility] ENDOSCOPY;  Service: Endoscopy;  Laterality: N/A;  . FRACTURE SURGERY Right    shoulder replacement  . HEMORRHOID BANDING    . ORIF DISTAL RADIUS FRACTURE Left   . UPPER GASTROINTESTINAL ENDOSCOPY      There were no vitals filed for this visit.   Subjective Assessment - 07/31/20 1644  Subjective Patient reports she is doing alright today. She is complianing of 6/10 R low back pain and 4/10 R sided neck pain. She leaves tomorrow for her road trip up to [place].    Pertinent History Patient has been seen by this therapist in the past for back related injuries/pain. Was last seen by this author in December of 2019. Since discharge patient has had COVID and an upper GI due to stomach issues. PMH includes aorta type A dissection (2011) with aortic valve replacement, bypass of R Coronary artery, hypertension, hyperlipidemia, CHF, pre-diabetes, AFIB, anxiety, depression, arthritis, GERD, atypical migraine, asthma, allergic rhinitis. 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 Does now have help with aide.    Limitations  Sitting;Reading;Lifting;Standing;Walking;Writing;Other (comment);House hold activities    How long can you sit comfortably? 20 mins before whole body discomfort    How long can you stand comfortably? 5 mins  or less: lower back    How long can you walk comfortably? 2 minutes    Patient Stated Goals to decrease pain and migraines    Currently in Pain? Yes    Pain Score 6     Pain Location Back    Pain Orientation Lower;Right    Pain Descriptors / Indicators Aching    Pain Type Chronic pain    Pain Onset More than a month ago    Pain Frequency Intermittent    Multiple Pain Sites Yes    Pain Score 4    Pain Location Neck    Pain Orientation Right    Pain Descriptors / Indicators Aching    Pain Type Chronic pain    Pain Onset More than a month ago    Pain Frequency Intermittent            TREATMENT   Manual Therapy  Prone:  CPAs and UPAs grade II-III mobilizations to C2-T1 20s/bout x 2 bouts/level; UPA grade II mobilizations to C1 20s/bout x 2 bouts bilaterally; STM with trigger point release, focus on levator scap, upper trap, and cervical paraspinals bilaterally.  R first rib mobilizations, grade II-III, 30s/bout x 3 bouts; R median nerve glides 2 x 10; CPA grade II-III mobilizations to L1-L5, 20s/bout x 2 bout/level;  R UPA grade II-III mobilizations to L3-L5, 20s/bout x 1 bout/level;   Trigger Point Dry Needling (TDN), unbilled Pt provided verbal consent to treatment. TDN performed to R upper trap with 2, 0.25 x 40 single needle placements. No local twitch response but pt reports deep ache.    Pt reports increase in R upper quarter pain upon arrival which radiates into the R hand. Session focused on cervical and upper quarter manual therapy but some time also spent on R low back due to upcoming car trip tomorrow. Utilized TDN briefly today as wel as STM for posterior cervical and upper back musculature.  Pt encouraged to continue HEP and follow-up as scheduled. She will  continue to benefit from skilled physical therapy to reduce pain, improve spinal mobility, and allow patient better quality of life.                             PT Short Term Goals - 07/09/20 1252      PT SHORT TERM GOAL #1   Title Patient will be independent in home exercise program to improve strength/mobility and decrease pain for better functional independence with ADLs.    Baseline 5/26: HEP next session 7/14; HEP compliant  Time 2    Period Weeks    Status Achieved    Target Date 05/28/20      PT SHORT TERM GOAL #2   Title Patient will report a worst pain of 7/10 on VAS in head to improve tolerance with ADLs and reduced symptoms with activities.    Baseline 5/26: 9/10 7/14: 7/10    Time 2    Period Weeks    Status Achieved    Target Date 05/28/20      PT SHORT TERM GOAL #3   Title Patient will report a worst pain of 6/10 on VAS in thoracic and lumbar spine to improve tolerance with ADLs and reduced symptoms with activities.    Baseline 6/29: 8/10 pain 7/14: 8/10    Time 2    Period Weeks    Status On-going    Target Date 07/23/20             PT Long Term Goals - 07/09/20 1252      PT LONG TERM GOAL #1   Title Patient will report a worst pain of 3/10 on VAS in her head/migraines to improve tolerance with ADLs and reduced symptoms with activities    Baseline 5/26: 9/10 7/14: 7/10    Time 8    Period Weeks    Status Partially Met    Target Date 09/02/20      PT LONG TERM GOAL #2   Title Patient will increase FOTO score to equal to or greater than   45/100  to demonstrate statistically significant improvement in mobility and quality of life.    Baseline 5/26: 44 with risk adjusted 33/100 7/14: 38.7%    Time 8    Period Weeks    Status On-going    Target Date 09/02/20      PT LONG TERM GOAL #3   Title Patient will reduce dizziness handicap inventory score to <30, for less dizziness with ADLs and increased safety with home and work  tasks.    Baseline 5/26: 40 7/14: 34%    Time 8    Period Weeks    Status Partially Met    Target Date 09/02/20      PT LONG TERM GOAL #4   Title Patient will improve cervical rotation ROM to >/=60 degrees bilaterally to improve functional mobility and demonstrate improved muscle tissue length    Baseline 5/26: R 46 L 42 7/14: assess next session 7/21: R 51 L 49    Time 8    Period Weeks    Status Partially Met    Target Date 09/02/20      PT LONG TERM GOAL #5   Title Patient will report a worst pain of 3/10 on VAS in  thoracic and lumbar spine    to improve tolerance with ADLs and reduced symptoms with activities and caregiver duties    Baseline 6/29: 8/10 7/13: 8/10    Time 8    Period Weeks    Status On-going    Target Date 09/02/20      PT LONG TERM GOAL #6   Title Patient will tolerate sitting unsupported demonstrating erect sitting posture with minimal thoracic kyphosis for 15+ minutes with maximum of 5/10 back pain to demonstrate improved back extensor strength and improved sitting tolerance.    Baseline 6/29: painful and limited 7/14: can sit 15 minutes but has increasing forward head posture with prolonged hold    Time 8    Period Weeks  Status Partially Met    Target Date 09/02/20      PT LONG TERM GOAL #7   Title Patient will reduce modified Oswestry score to <20 as to demonstrate minimal disability with ADLs including improved sleeping tolerance, walking/sitting tolerance etc for better mobility with ADLs.    Baseline 6/29: 46% 7/13: 49%    Time 8    Period Weeks    Status Partially Met    Target Date 09/02/20                 Plan - 07/31/20 1651    Clinical Impression Statement Pt reports increase in R upper quarter pain upon arrival which radiates into the R hand. Session focused on cervical and upper quarter manual therapy but some time also spent on R low back due to upcoming car trip tomorrow. Utilized TDN briefly today as wel as STM for posterior  cervical and upper back musculature.  Pt encouraged to continue HEP and follow-up as scheduled. She will continue to benefit from skilled physical therapy to reduce pain, improve spinal mobility, and allow patient better quality of life.    Personal Factors and Comorbidities Age;Comorbidity 3+;Fitness;Finances;Past/Current Experience;Social Background;Time since onset of injury/illness/exacerbation;Other    Comorbidities aorta type A dissection (2011) with aortic valve replacement, bypass of R Coronary artery, hypertension, hyperlipidemia, CHF, pre-diabetes, AFIB, anxiety, depression, arthritis, GERD, atypical migraine, asthma, allergic rhinitis.    Examination-Activity Limitations Bend;Caring for Others;Carry;Dressing;Stairs;Squat;Sit;Reach Overhead;Locomotion Level;Lift;Stand;Toileting;Transfers    Examination-Participation Restrictions Church;Cleaning;Community Activity;Interpersonal Relationship;Driving;Laundry;Volunteer;Shop;Personal Finances;Meal Prep;Medication Management;Yard Work    Product manager    Rehab Potential Fair    Clinical Impairments Affecting Rehab Potential + CAD testing, history of AAA, anemia, anxiety, asthma, CAD, chronic combined ystolic heart failure, GERD, Marfan syndrome, OA, RA (+) good work Psychologist, forensic, motivated, age     PT Frequency 2x / week    PT Duration 8 weeks    PT Treatment/Interventions ADLs/Self Care Home Management;Aquatic Therapy;Vestibular;Vasopneumatic Device;Taping;Passive range of motion;Dry needling;Energy conservation;Manual techniques;Therapeutic exercise;Balance training;Neuromuscular re-education;Patient/family education;Therapeutic activities;Functional mobility training;Stair training;Gait training;Electrical Stimulation;Iontophoresis '4mg'$ /ml Dexamethasone;Moist Heat;Traction;Ultrasound;Cryotherapy;Canalith Repostioning;Joint Manipulations;Spinal Manipulations    PT Next Visit Plan back interventions; posture     PT Home Exercise Plan Educated in seated Lev Scap and U/T stretches for HEP, as well as scap pinches and chin tucks.    Consulted and Agree with Plan of Care Patient           Patient will benefit from skilled therapeutic intervention in order to improve the following deficits and impairments:  Cardiopulmonary status limiting activity, Decreased activity tolerance, Decreased endurance, Decreased mobility, Decreased range of motion, Decreased strength, Difficulty walking, Dizziness, Hypomobility, Impaired flexibility, Impaired perceived functional ability, Increased muscle spasms, Impaired UE functional use, Impaired vision/preception, Obesity, Postural dysfunction, Improper body mechanics, Pain, Abnormal gait  Visit Diagnosis: Cervicalgia  Chronic low back pain, unspecified back pain laterality, unspecified whether sciatica present     Problem List Patient Active Problem List   Diagnosis Date Noted  . Acute bronchitis with bronchospasm 05/28/2020  . Diarrhea 04/11/2020  . Epigastric pain 02/19/2020  . Dark stools 02/19/2020  . PAF (paroxysmal atrial fibrillation) ([place]) 02/05/2019  . Shortness of breath 01/27/2019  . Irregular surface of cornea 12/25/2018  . HPV (human papilloma virus) infection 12/01/2016  . Screening mammogram, encounter for 11/29/2016  . Estrogen deficiency 11/29/2016  . Encounter for routine gynecological examination 11/29/2016  . Grade III hemorrhoids 10/06/2016  . Tachycardia 08/14/2016  . Hemorrhoids 08/13/2016  . Atypical migraine 08/03/2016  .  CAD (coronary artery disease)   . AAA (abdominal aortic aneurysm) ([place])   . Chronic combined systolic (congestive) and diastolic (congestive) heart failure ([place])   . Prediabetes 02/11/2016  . Generalized anxiety disorder 12/08/2015  . Panic attacks 07/28/2015  . Sensorineural hearing loss 03/27/2013  . Vitamin D deficiency 07/03/2012  . H/O aortic dissection 05/12/2012  . Class 2 obesity due to excess  calories with body mass index (BMI) of 35.0 to 35.9 in adult 11/21/2011  . Obesity (BMI 30.0-34.9) 11/21/2011  . Routine general medical examination at a health care facility 09/20/2011  . S/P AVR (aortic valve replacement) 01/07/2011  . CORONARY ARTERY BYPASS GRAFT, HX OF 01/07/2011  . Arthritis 12/20/2010  . Back pain 12/15/2010  . Dissection of thoracic aorta ([place]) 12/11/2010  . H/O dissecting abdominal aortic aneurysm repair 08/24/2010  . IRRITABLE BOWEL SYNDROME 09/09/2009  . Obstructive sleep apnea 08/04/2009  . External hemorrhoid 06/26/2009  . Osteoporosis 01/09/2009  . Hyperlipidemia 07/26/2007  . Depression with anxiety 07/26/2007  . Essential hypertension 07/26/2007  . Allergic rhinitis 07/26/2007  . Asthma 07/26/2007  . GERD 07/26/2007  . Osteoarthritis 07/26/2007  . Ocular migraine 07/26/2007  . Mild intermittent asthma without complication [id number]/[id number]     Phillips Grout PT, DPT, GCS  Terrisha Lopata 08/01/2020, 10:13 AM  [place] MAIN [facility] SERVICES [address] [place], [place], [address] Phone: [phone]   Fax:  [phone]  Name: NAKIYAH BEVERLEY MRN: [id number] Date of Birth: 1956-09-27

## 2020-08-01 ENCOUNTER — Ambulatory Visit: Payer: Medicare HMO

## 2020-08-05 ENCOUNTER — Other Ambulatory Visit: Payer: Self-pay

## 2020-08-05 ENCOUNTER — Ambulatory Visit: Payer: Medicare HMO

## 2020-08-05 DIAGNOSIS — M546 Pain in thoracic spine: Secondary | ICD-10-CM | POA: Diagnosis not present

## 2020-08-05 DIAGNOSIS — R293 Abnormal posture: Secondary | ICD-10-CM | POA: Diagnosis not present

## 2020-08-05 DIAGNOSIS — M542 Cervicalgia: Secondary | ICD-10-CM | POA: Diagnosis not present

## 2020-08-05 DIAGNOSIS — G8929 Other chronic pain: Secondary | ICD-10-CM | POA: Diagnosis not present

## 2020-08-05 DIAGNOSIS — M545 Low back pain, unspecified: Secondary | ICD-10-CM

## 2020-08-05 DIAGNOSIS — M5412 Radiculopathy, cervical region: Secondary | ICD-10-CM | POA: Diagnosis not present

## 2020-08-05 NOTE — Therapy (Signed)
[place] MAIN [facility] SERVICES [address] [place], [place], [address] Phone: [phone]   Fax:  [phone]  Physical Therapy Treatment  Patient Details  Name: Rachel Torres MRN: [id number] Date of Birth: [birth year] Referring Provider (PT): Glori Bickers, Wynelle Fanny MD   Encounter Date: 08/05/2020   PT End of Session - 08/05/20 1617    Visit Number 19    Number of Visits 28    Date for PT Re-Evaluation 09/02/20    Authorization Type 9/10 07/01/20    Authorization Time Period back pain added to POC on 06/17/20    PT Start Time 1515    PT Stop Time 1559    PT Time Calculation (min) 44 min    Activity Tolerance Patient tolerated treatment well    Behavior During Therapy [facility] for tasks assessed/performed           Past Medical History:  Diagnosis Date  . AAA (abdominal aortic aneurysm) ([place])    a. Chronic w/o evidence of aneurysmal dil on CTA 01/2016.  Marland Kitchen Alcohol abuse   . Allergy   . Anemia   . Anxiety   . CAD (coronary artery disease)    a. 08/2010 s/p CABG x 1 (VG->RCA) @ time of Ao dissection repair; b. 01/2016 Lexiscan MV: mid antsept/apical defect w/ ? peri-infarct ischemia-->likely attenuation-->Med Rx.  . Chewing difficulty   . Chronic combined systolic (congestive) and diastolic (congestive) heart failure ([place])    a. 2012 EF 30-35%; b. 04/2015 EF 40-45%; c. 01/2016 Echo: Ef 55-65%, nl AoV bioprosthesis, nl RV, nl PASP.  Marland Kitchen Constipation   . Depression   . Edema of both lower extremities   . Gallbladder sludge   . GERD (gastroesophageal reflux disease)   . Heart failure ([place])   . Heart valve problem   . History of Bicuspid Aortic Valve    a. 08/2010 s/p AVR @ time of Ao dissection repair; b. 01/2016 Echo: Ef 55-65%, nl AoV bioprosthesis, nl RV, nl PASP.  Marland Kitchen Hx of repair of dissecting thoracic aortic aneurysm, [place] type A    a. 08/2010 s/p repair with AVR and VG->RCA; b. 01/2016 CTA: stable appearance of Asc Thoracic Aortic graft.  Opacification of flase lumen of chronic abd Ao dissection w/ retrograde flow through lumbar arteries. No aneurysmal dil of flase lumen.  . Hyperlipidemia   . Hypertensive heart disease   . IBS (irritable bowel syndrome)   . Internal hemorrhoids   . Kidney problem   . Marfan syndrome    pt denies  . Migraine   . Obstructive sleep apnea   . Osteoarthritis   . Osteoporosis   . Palpitations   . Pneumonia   . RA (rheumatoid arthritis) ([place])    a. Followed by Dr. [name]    Past Surgical History:  Procedure Laterality Date  . ABDOMINAL AORTIC ANEURYSM REPAIR    . CARDIAC CATHETERIZATION  2001  . [place]  . CHOLECYSTECTOMY    . COLONOSCOPY    . ESOPHAGOGASTRODUODENOSCOPY (EGD) WITH PROPOFOL N/A 05/08/2020   Procedure: ESOPHAGOGASTRODUODENOSCOPY (EGD) WITH PROPOFOL;  Surgeon: Virgel Manifold, MD;  Location: [facility] ENDOSCOPY;  Service: Endoscopy;  Laterality: N/A;  . FRACTURE SURGERY Right    shoulder replacement  . HEMORRHOID BANDING    . ORIF DISTAL RADIUS FRACTURE Left   . UPPER GASTROINTESTINAL ENDOSCOPY      There were no vitals filed for this visit.   Subjective Assessment - 08/05/20 1607  Subjective Patient reports her trip to [place] aggrevated her sciatic nerve and is having radiating pains that are not being relieved.    Pertinent History Patient has been seen by this therapist in the past for back related injuries/pain. Was last seen by this author in December of 2019. Since discharge patient has had COVID and an upper GI due to stomach issues. PMH includes aorta type A dissection (2011) with aortic valve replacement, bypass of R Coronary artery, hypertension, hyperlipidemia, CHF, pre-diabetes, AFIB, anxiety, depression, arthritis, GERD, atypical migraine, asthma, allergic rhinitis. 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 Does now have help with aide.    Limitations Sitting;Reading;Lifting;Standing;Walking;Writing;Other (comment);House hold  activities    How long can you sit comfortably? 20 mins before whole body discomfort    How long can you stand comfortably? 5 mins  or less: lower back    How long can you walk comfortably? 2 minutes    Patient Stated Goals to decrease pain and migraines    Currently in Pain? Yes    Pain Score 6     Pain Location Back    Pain Orientation Lower;Right    Pain Descriptors / Indicators Stabbing;Radiating    Pain Type Chronic pain;Neuropathic pain    Pain Radiating Towards LLE    Pain Onset More than a month ago    Pain Frequency Constant    Pain Onset More than a month ago                  Treatment:    Manual Prone:              CPAs and UPAs grade II-IV to thoracic and lumbar spine, 5x 10 seconds each level. Hypomobility throughout with reduction with repetition. T10 and L2 hypomobile  J mobilization to upper thoracic/lower cervical for reduced thoracic kyphosis. 3x30 seconds              Prone STM with trigger point release to R piriformis and trochanteric insertion adjacent musculature x 6 minutes with patient reporting decreased symptoms by end of manual   Roller to R piriformis and lumbar musculature x 3 minutes   Supine: SAD with belt in figure four position 5x30 second inferior glide direction  SAD with hooklying position in inferior slight lateral glide 5x 30 second holds with belt  Piriformis stretch with PT overpressure 2x 60 seconds  Seated:  Trigger point/soft tissue release with movement to improve soft tissue limited muscle tissue length. 5 minutes    TherEx Seated   Posterior pelvic tilt 15 x 5 second holds  Tennis ball soft tissue release 60 seconds   Supine :  Sciatic nerve glides RLE on PT shoulder 20x  Standing:   Tennis ball soft tissue release against wall x 60 seconds             Pt educated throughout session about proper posture and technique with exercises. Improved exercise technique, movement at target joints, use of target muscles after  min to mod verbal, visual, tactile cues                      PT Education - 08/05/20 1617    Education provided Yes    Education Details exercise technique, body mechanics, manual    Person(s) Educated Patient    Methods Explanation;Demonstration;Tactile cues;Verbal cues    Comprehension Verbalized understanding;Returned demonstration;Verbal cues required;Tactile cues required            PT  Short Term Goals - 07/09/20 1252      PT SHORT TERM GOAL #1   Title Patient will be independent in home exercise program to improve strength/mobility and decrease pain for better functional independence with ADLs.    Baseline 5/26: HEP next session 7/14; HEP compliant    Time 2    Period Weeks    Status Achieved    Target Date 05/28/20      PT SHORT TERM GOAL #2   Title Patient will report a worst pain of 7/10 on VAS in head to improve tolerance with ADLs and reduced symptoms with activities.    Baseline 5/26: 9/10 7/14: 7/10    Time 2    Period Weeks    Status Achieved    Target Date 05/28/20      PT SHORT TERM GOAL #3   Title Patient will report a worst pain of 6/10 on VAS in thoracic and lumbar spine to improve tolerance with ADLs and reduced symptoms with activities.    Baseline 6/29: 8/10 pain 7/14: 8/10    Time 2    Period Weeks    Status On-going    Target Date 07/23/20             PT Long Term Goals - 07/09/20 1252      PT LONG TERM GOAL #1   Title Patient will report a worst pain of 3/10 on VAS in her head/migraines to improve tolerance with ADLs and reduced symptoms with activities    Baseline 5/26: 9/10 7/14: 7/10    Time 8    Period Weeks    Status Partially Met    Target Date 09/02/20      PT LONG TERM GOAL #2   Title Patient will increase FOTO score to equal to or greater than   45/100  to demonstrate statistically significant improvement in mobility and quality of life.    Baseline 5/26: 44 with risk adjusted 33/100 7/14: 38.7%    Time 8     Period Weeks    Status On-going    Target Date 09/02/20      PT LONG TERM GOAL #3   Title Patient will reduce dizziness handicap inventory score to <30, for less dizziness with ADLs and increased safety with home and work tasks.    Baseline 5/26: 40 7/14: 34%    Time 8    Period Weeks    Status Partially Met    Target Date 09/02/20      PT LONG TERM GOAL #4   Title Patient will improve cervical rotation ROM to >/=60 degrees bilaterally to improve functional mobility and demonstrate improved muscle tissue length    Baseline 5/26: R 46 L 42 7/14: assess next session 7/21: R 51 L 49    Time 8    Period Weeks    Status Partially Met    Target Date 09/02/20      PT LONG TERM GOAL #5   Title Patient will report a worst pain of 3/10 on VAS in  thoracic and lumbar spine    to improve tolerance with ADLs and reduced symptoms with activities and caregiver duties    Baseline 6/29: 8/10 7/13: 8/10    Time 8    Period Weeks    Status On-going    Target Date 09/02/20      PT LONG TERM GOAL #6   Title Patient will tolerate sitting unsupported demonstrating erect sitting posture with minimal thoracic kyphosis for  15+ minutes with maximum of 5/10 back pain to demonstrate improved back extensor strength and improved sitting tolerance.    Baseline 6/29: painful and limited 7/14: can sit 15 minutes but has increasing forward head posture with prolonged hold    Time 8    Period Weeks    Status Partially Met    Target Date 09/02/20      PT LONG TERM GOAL #7   Title Patient will reduce modified Oswestry score to <20 as to demonstrate minimal disability with ADLs including improved sleeping tolerance, walking/sitting tolerance etc for better mobility with ADLs.    Baseline 6/29: 46% 7/13: 49%    Time 8    Period Weeks    Status Partially Met    Target Date 09/02/20                 Plan - 08/05/20 1621    Clinical Impression Statement Patient presents with increased radiating symptoms  that are reduced with manual and therex. Patient educated on self trigger point release and demonstrates understanding with good body mechanics. Symptoms decreased in radiation with slight centralization reported at end of session.  Pt encouraged to continue HEP and follow-up as scheduled. Patient will continue to benefit from skilled physical therapy to reduce pain, improve spinal mobility, and allow patient better quality of life.    Personal Factors and Comorbidities Age;Comorbidity 3+;Fitness;Finances;Past/Current Experience;Social Background;Time since onset of injury/illness/exacerbation;Other    Comorbidities aorta type A dissection (2011) with aortic valve replacement, bypass of R Coronary artery, hypertension, hyperlipidemia, CHF, pre-diabetes, AFIB, anxiety, depression, arthritis, GERD, atypical migraine, asthma, allergic rhinitis.    Examination-Activity Limitations [place];Caring for Others;Carry;Dressing;Stairs;Squat;Sit;Reach Overhead;Locomotion Level;Lift;Stand;Toileting;Transfers    Examination-Participation Restrictions Church;Cleaning;Community Activity;Interpersonal Relationship;Driving;Laundry;Volunteer;Shop;Personal Finances;Meal Prep;Medication Management;Yard Work    Product manager    Rehab Potential Fair    Clinical Impairments Affecting Rehab Potential + CAD testing, history of AAA, anemia, anxiety, asthma, CAD, chronic combined ystolic heart failure, GERD, Marfan syndrome, OA, RA (+) good work Psychologist, forensic, motivated, age     PT Frequency 2x / week    PT Duration 8 weeks    PT Treatment/Interventions ADLs/Self Care Home Management;Aquatic Therapy;Vestibular;Vasopneumatic Device;Taping;Passive range of motion;Dry needling;Energy conservation;Manual techniques;Therapeutic exercise;Balance training;Neuromuscular re-education;Patient/family education;Therapeutic activities;Functional mobility training;Stair training;Gait training;Electrical  Stimulation;Iontophoresis '4mg'$ /ml Dexamethasone;Moist Heat;Traction;Ultrasound;Cryotherapy;Canalith Repostioning;Joint Manipulations;Spinal Manipulations    PT Next Visit Plan back interventions; posture    PT Home Exercise Plan Educated in seated Lev Scap and U/T stretches for HEP, as well as scap pinches and chin tucks.    Consulted and Agree with Plan of Care Patient           Patient will benefit from skilled therapeutic intervention in order to improve the following deficits and impairments:  Cardiopulmonary status limiting activity, Decreased activity tolerance, Decreased endurance, Decreased mobility, Decreased range of motion, Decreased strength, Difficulty walking, Dizziness, Hypomobility, Impaired flexibility, Impaired perceived functional ability, Increased muscle spasms, Impaired UE functional use, Impaired vision/preception, Obesity, Postural dysfunction, Improper body mechanics, Pain, Abnormal gait  Visit Diagnosis: Cervicalgia  Chronic low back pain, unspecified back pain laterality, unspecified whether sciatica present  Abnormal posture  Pain in thoracic spine  Chronic midline low back pain, unspecified whether sciatica present     Problem List Patient Active Problem List   Diagnosis Date Noted  . Acute bronchitis with bronchospasm 05/28/2020  . Diarrhea 04/11/2020  . Epigastric pain 02/19/2020  . Dark stools 02/19/2020  . PAF (paroxysmal atrial fibrillation) ([place]) 02/05/2019  . Shortness of breath 01/27/2019  .  Irregular surface of cornea 12/25/2018  . HPV (human papilloma virus) infection 12/01/2016  . Screening mammogram, encounter for 11/29/2016  . Estrogen deficiency 11/29/2016  . Encounter for routine gynecological examination 11/29/2016  . Grade III hemorrhoids 10/06/2016  . Tachycardia 08/14/2016  . Hemorrhoids 08/13/2016  . Atypical migraine 08/03/2016  . CAD (coronary artery disease)   . AAA (abdominal aortic aneurysm) ([place])   . Chronic combined  systolic (congestive) and diastolic (congestive) heart failure ([place])   . Prediabetes 02/11/2016  . Generalized anxiety disorder 12/08/2015  . Panic attacks 07/28/2015  . Sensorineural hearing loss 03/27/2013  . Vitamin D deficiency 07/03/2012  . H/O aortic dissection 05/12/2012  . Class 2 obesity due to excess calories with body mass index (BMI) of 35.0 to 35.9 in adult 11/21/2011  . Obesity (BMI 30.0-34.9) 11/21/2011  . Routine general medical examination at a health care facility 09/20/2011  . S/P AVR (aortic valve replacement) 01/07/2011  . CORONARY ARTERY BYPASS GRAFT, HX OF 01/07/2011  . Arthritis 12/20/2010  . Back pain 12/15/2010  . Dissection of thoracic aorta ([place]) 12/11/2010  . H/O dissecting abdominal aortic aneurysm repair 08/24/2010  . IRRITABLE BOWEL SYNDROME 09/09/2009  . Obstructive sleep apnea 08/04/2009  . External hemorrhoid 06/26/2009  . Osteoporosis 01/09/2009  . Hyperlipidemia 07/26/2007  . Depression with anxiety 07/26/2007  . Essential hypertension 07/26/2007  . Allergic rhinitis 07/26/2007  . Asthma 07/26/2007  . GERD 07/26/2007  . Osteoarthritis 07/26/2007  . Ocular migraine 07/26/2007  . Mild intermittent asthma without complication 23/95/3202   Janna Arch, PT, DPT   08/05/2020, 4:24 PM  [place] MAIN [facility] SERVICES [address] [place], [place], [address] Phone: [phone]   Fax:  [phone]  Name: Rachel Torres MRN: [id number] Date of Birth: 1956/06/19

## 2020-08-08 ENCOUNTER — Ambulatory Visit: Payer: Medicare HMO

## 2020-08-08 ENCOUNTER — Other Ambulatory Visit: Payer: Self-pay

## 2020-08-08 DIAGNOSIS — G8929 Other chronic pain: Secondary | ICD-10-CM | POA: Diagnosis not present

## 2020-08-08 DIAGNOSIS — R293 Abnormal posture: Secondary | ICD-10-CM

## 2020-08-08 DIAGNOSIS — M546 Pain in thoracic spine: Secondary | ICD-10-CM

## 2020-08-08 DIAGNOSIS — M545 Low back pain, unspecified: Secondary | ICD-10-CM

## 2020-08-08 DIAGNOSIS — M542 Cervicalgia: Secondary | ICD-10-CM

## 2020-08-08 DIAGNOSIS — M5412 Radiculopathy, cervical region: Secondary | ICD-10-CM | POA: Diagnosis not present

## 2020-08-08 NOTE — Therapy (Signed)
[place] MAIN [facility] [address] [place], [place], [address] Phone: [phone]   Fax:  [phone]  Physical Therapy Treatment Physical Therapy Progress Note   Dates of reporting period  07/01/20   to   08/08/20  Patient Details  Name: Rachel Torres MRN: [id number] Date of Birth: [birth year] Referring Provider (PT): Loura Pardon A MD   Encounter Date: 08/08/2020   PT End of Session - 08/08/20 1114    Visit Number 20    Number of Visits 28    Date for PT Re-Evaluation 09/02/20    Authorization Type 10/10 07/01/20; next session 1/10 8/20    Authorization Time Period back pain added to POC on 06/17/20    PT Start Time 1000    PT Stop Time 1044    PT Time Calculation (min) 44 min    Activity Tolerance Patient tolerated treatment well    Behavior During Therapy [facility] for tasks assessed/performed           Past Medical History:  Diagnosis Date  . AAA (abdominal aortic aneurysm) ([place])    a. Chronic w/o evidence of aneurysmal dil on CTA 01/2016.  Marland Kitchen Alcohol abuse   . Allergy   . Anemia   . Anxiety   . CAD (coronary artery disease)    a. 08/2010 s/p CABG x 1 (VG->RCA) @ time of Ao dissection repair; b. 01/2016 Lexiscan MV: mid antsept/apical defect w/ ? peri-infarct ischemia-->likely attenuation-->Med Rx.  . Chewing difficulty   . Chronic combined systolic (congestive) and diastolic (congestive) heart failure ([place])    a. 2012 EF 30-35%; b. 04/2015 EF 40-45%; c. 01/2016 Echo: Ef 55-65%, nl AoV bioprosthesis, nl RV, nl PASP.  Marland Kitchen Constipation   . Depression   . Edema of both lower extremities   . Gallbladder sludge   . GERD (gastroesophageal reflux disease)   . Heart failure ([place])   . Heart valve problem   . History of Bicuspid Aortic Valve    a. 08/2010 s/p AVR @ time of Ao dissection repair; b. 01/2016 Echo: Ef 55-65%, nl AoV bioprosthesis, nl RV, nl PASP.  Marland Kitchen Hx of repair of dissecting thoracic aortic aneurysm, [place] type A    a.  08/2010 s/p repair with AVR and VG->RCA; b. 01/2016 CTA: stable appearance of Asc Thoracic Aortic graft. Opacification of flase lumen of chronic abd Ao dissection w/ retrograde flow through lumbar arteries. No aneurysmal dil of flase lumen.  . Hyperlipidemia   . Hypertensive heart disease   . IBS (irritable bowel syndrome)   . Internal hemorrhoids   . Kidney problem   . Marfan syndrome    pt denies  . Migraine   . Obstructive sleep apnea   . Osteoarthritis   . Osteoporosis   . Palpitations   . Pneumonia   . RA (rheumatoid arthritis) ([place])    a. Followed by Dr. [name]    Past Surgical History:  Procedure Laterality Date  . ABDOMINAL AORTIC ANEURYSM REPAIR    . CARDIAC CATHETERIZATION  2001  . [place]  . CHOLECYSTECTOMY    . COLONOSCOPY    . ESOPHAGOGASTRODUODENOSCOPY (EGD) WITH PROPOFOL N/A 05/08/2020   Procedure: ESOPHAGOGASTRODUODENOSCOPY (EGD) WITH PROPOFOL;  Surgeon: Virgel Manifold, MD;  Location: [facility] END[facility];  Service: Endoscopy;  Laterality: N/A;  . FRACTURE SURGERY Right    shoulder replacement  . HEMORRHOID BANDING    . ORIF DISTAL RADIUS FRACTURE Left   . UPPER GASTROINTESTINAL  ENDOSCOPY      There were no vitals filed for this visit.   Subjective Assessment - 08/08/20 1101    Subjective Patient reports her radiating symptoms have centralized after her last visit and remained centralized. Had a migraine yesterday but in general is improving.    Pertinent History Patient has been seen by this therapist in the past for back related injuries/pain. Was last seen by this author in December of 2019. Since discharge patient has had COVID and an upper GI due to stomach issues. PMH includes aorta type A dissection (2011) with aortic valve replacement, bypass of R Coronary artery, hypertension, hyperlipidemia, CHF, pre-diabetes, AFIB, anxiety, depression, arthritis, GERD, atypical migraine, asthma, allergic rhinitis. 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 Does now have help with aide.    Limitations Sitting;Reading;Lifting;Standing;Walking;Writing;Other (comment);House hold activities    How long can you sit comfortably? 20 mins before whole body discomfort    How long can you stand comfortably? 5 mins  or less: lower back    How long can you walk comfortably? 2 minutes    Patient Stated Goals to decrease pain and migraines    Currently in Pain? Yes    Pain Score 5     Pain Location Back    Pain Orientation Lower;Right    Pain Descriptors / Indicators Aching;Stabbing    Pain Type Chronic pain;Neuropathic pain    Pain Onset More than a month ago    Pain Frequency Constant    Pain Onset --              goals:  VAS:  Cervical: 7/10 Thoracic:4/10 Lumbar: 8/10   FOTO: 43% DHI: 32% Cervical rotation: L 60 R 51  Sitting tolerance: able to drive for an hour but is having a hard time with pain levels with prolonged sitting.  MODI: 56%     Treatment:    Manual Prone:              CPAs and UPAs grade II-IV to thoracic and lumbar spine, 5x 10 seconds each level. Hypomobility throughout with reduction with repetition. T10 and L2 hypomobile  J mobilization to upper thoracic/lower cervical for reduced thoracic kyphosis. 3x30 seconds              Prone STM with trigger point release to R piriformis and trochanteric insertion adjacent musculature x 6 minutes with patient reporting decreased symptoms by end of manual              Roller to R piriformis and lumbar musculature x 3 minutes   Supine: SAD with belt in figure four position 5x30 second inferior glide direction  SAD with hooklying position in inferior slight lateral glide 5x 30 second holds with belt  Piriformis stretch with PT overpressure 2x 60 seconds   Seated:  Trigger point/soft tissue release with movement to improve soft tissue limited muscle tissue length. 5 minutes     TherEx Seated              Posterior pelvic tilt 15 x 5 second holds            large swiss ball forward and diagonal rollout with hold 10x each direction, 30 times total.     Supine :             Sciatic nerve glides RLE on PT shoulder 20x  Standing:          wall posture 10x 3 second holds  Pt educated throughout session about proper posture and technique with exercises. Improved exercise technique, movement at target joints, use of target muscles after min to mod verbal, visual, tactile cues    Patient's condition has the potential to improve in response to therapy. Maximum improvement is yet to be obtained. The anticipated improvement is attainable and reasonable in a generally predictable time.  Patient reports prior to her flare up her back in general was improving with decreased episodes of high pain levels                      PT Education - 08/08/20 1112    Education provided Yes    Education Details exercise technique, body mechanics, manual    Person(s) Educated Patient    Methods Explanation;Demonstration;Tactile cues;Verbal cues    Comprehension Verbalized understanding;Returned demonstration;Verbal cues required;Tactile cues required            PT Short Term Goals - 08/08/20 1010      PT SHORT TERM GOAL #1   Title Patient will be independent in home exercise program to improve strength/mobility and decrease pain for better functional independence with ADLs.    Baseline 5/26: HEP next session 7/14; HEP compliant    Time 2    Period Weeks    Status Achieved    Target Date 05/28/20      PT SHORT TERM GOAL #2   Title Patient will report a worst pain of 7/10 on VAS in head to improve tolerance with ADLs and reduced symptoms with activities.    Baseline 5/26: 9/10 7/14: 7/10    Time 2    Period Weeks    Status Achieved    Target Date 05/28/20      PT SHORT TERM GOAL #3   Title Patient will report a worst pain of 6/10 on VAS in thoracic and lumbar spine to improve tolerance with ADLs and reduced symptoms with  activities.    Baseline 6/29: 8/10 pain 7/14: 8/10 8/20: thoracic 4/10 lumbar 8/10    Time 2    Period Weeks    Status Partially Met    Target Date 07/23/20             PT Long Term Goals - 08/08/20 1011      PT LONG TERM GOAL #1   Title Patient will report a worst pain of 3/10 on VAS in her head/migraines to improve tolerance with ADLs and reduced symptoms with activities    Baseline 5/26: 9/10 7/14: 7/10 8/20: 7/10    Time 8    Period Weeks    Status Partially Met    Target Date 09/02/20      PT LONG TERM GOAL #2   Title Patient will increase FOTO score to equal to or greater than   45/100  to demonstrate statistically significant improvement in mobility and quality of life.    Baseline 5/26: 44 with risk adjusted 33/100 7/14: 38.7% 8/20: 43%    Time 8    Period Weeks    Status Partially Met    Target Date 09/02/20      PT LONG TERM GOAL #3   Title Patient will reduce dizziness handicap inventory score to <30, for less dizziness with ADLs and increased safety with home and work tasks.    Baseline 5/26: 40 7/14: 34% 8/20: 32%    Time 8    Period Weeks    Status Partially Met    Target Date  09/02/20      PT LONG TERM GOAL #4   Title Patient will improve cervical rotation ROM to >/=60 degrees bilaterally to improve functional mobility and demonstrate improved muscle tissue length    Baseline 5/26: R 46 L 42 7/14: assess next session 7/21: R 51 L 49 8/20: L 60 R 51    Time 8    Period Weeks    Status Partially Met    Target Date 09/02/20      PT LONG TERM GOAL #5   Title Patient will report a worst pain of 3/10 on VAS in  thoracic and lumbar spine    to improve tolerance with ADLs and reduced symptoms with activities and caregiver duties    Baseline 6/29: 8/10 7/13: 8/10 8/20: thoracic 4/10 lumbar 8/10    Time 8    Period Weeks    Status Partially Met    Target Date 09/02/20      PT LONG TERM GOAL #6   Title Patient will tolerate sitting unsupported demonstrating  erect sitting posture with minimal thoracic kyphosis for 15+ minutes with maximum of 5/10 back pain to demonstrate improved back extensor strength and improved sitting tolerance.    Baseline 6/29: painful and limited 7/14: can sit 15 minutes but has increasing forward head posture with prolonged hold 8/20: could drive 1 hour but had increase of pain to 8/10    Time 8    Period Weeks    Status Partially Met    Target Date 09/02/20      PT LONG TERM GOAL #7   Title Patient will reduce modified Oswestry score to <20 as to demonstrate minimal disability with ADLs including improved sleeping tolerance, walking/sitting tolerance etc for better mobility with ADLs.    Baseline 6/29: 46% 7/13: 49% 8/20: 56%    Time 8    Period Weeks    Status Partially Met    Target Date 09/02/20                 Plan - 08/08/20 1131    Clinical Impression Statement Patient has had recent flare up of sciatic region resulting in increased pain and limitations however the pain has begun to centralize allowing for progression back to plan of care. Patients thoracic spine is improving in function and pain levels are reducing with consequent improvements in posture.  Patient's condition has the potential to improve in response to therapy. Maximum improvement is yet to be obtained. The anticipated improvement is attainable and reasonable in a generally predictable time. Patient will continue to benefit from skilled physical therapy to reduce pain, improve spinal mobility, and allow patient better quality of life.    Personal Factors and Comorbidities Age;Comorbidity 3+;Fitness;Finances;Past/Current Experience;Social Background;Time since onset of injury/illness/exacerbation;Other    Comorbidities aorta type A dissection (2011) with aortic valve replacement, bypass of R Coronary artery, hypertension, hyperlipidemia, CHF, pre-diabetes, AFIB, anxiety, depression, arthritis, GERD, atypical migraine, asthma, allergic rhinitis.     Examination-Activity Limitations [place];Caring for Others;Carry;Dressing;Stairs;Squat;Sit;Reach Overhead;Locomotion Level;Lift;Stand;Toileting;Transfers    Examination-Participation Restrictions Church;Cleaning;Community Activity;Interpersonal Relationship;Driving;Laundry;Volunteer;Shop;Personal Finances;Meal Prep;Medication Management;Yard Work    Product manager    Rehab Potential Fair    Clinical Impairments Affecting Rehab Potential + CAD testing, history of AAA, anemia, anxiety, asthma, CAD, chronic combined ystolic heart failure, GERD, Marfan syndrome, OA, RA (+) good work Psychologist, forensic, motivated, age     PT Frequency 2x / week    PT Duration 8 weeks    PT Treatment/Interventions ADLs/Self Care  Home Management;Aquatic Therapy;Vestibular;Vasopneumatic Device;Taping;Passive range of motion;Dry needling;Energy conservation;Manual techniques;Therapeutic exercise;Balance training;Neuromuscular re-education;Patient/family education;Therapeutic activities;Functional mobility training;Stair training;Gait training;Electrical Stimulation;Iontophoresis 72m/ml Dexamethasone;Moist Heat;Traction;Ultrasound;Cryotherapy;Canalith Repostioning;Joint Manipulations;Spinal Manipulations    PT Next Visit Plan back interventions; posture    PT Home Exercise Plan Educated in seated Lev Scap and U/T stretches for HEP, as well as scap pinches and chin tucks.    Consulted and Agree with Plan of Care Patient           Patient will benefit from skilled therapeutic intervention in order to improve the following deficits and impairments:  Cardiopulmonary status limiting activity, Decreased activity tolerance, Decreased endurance, Decreased mobility, Decreased range of motion, Decreased strength, Difficulty walking, Dizziness, Hypomobility, Impaired flexibility, Impaired perceived functional ability, Increased muscle spasms, Impaired UE functional use, Impaired vision/preception,  Obesity, Postural dysfunction, Improper body mechanics, Pain, Abnormal gait  Visit Diagnosis: Cervicalgia  Chronic low back pain, unspecified back pain laterality, unspecified whether sciatica present  Abnormal posture  Pain in thoracic spine     Problem List Patient Active Problem List   Diagnosis Date Noted  . Acute bronchitis with bronchospasm 05/28/2020  . Diarrhea 04/11/2020  . Epigastric pain 02/19/2020  . Dark stools 02/19/2020  . PAF (paroxysmal atrial fibrillation) ([place] 02/05/2019  . Shortness of breath 01/27/2019  . Irregular surface of cornea 12/25/2018  . HPV (human papilloma virus) infection 12/01/2016  . Screening mammogram, encounter for 11/29/2016  . Estrogen deficiency 11/29/2016  . Encounter for routine gynecological examination 11/29/2016  . Grade III hemorrhoids 10/06/2016  . Tachycardia 08/14/2016  . Hemorrhoids 08/13/2016  . Atypical migraine 08/03/2016  . CAD (coronary artery disease)   . [place]) [place]   . Chronic combined systolic (congestive) and diastolic (congestive) heart failure ([place]   . Prediabetes 02/11/2016  . Generalized anxiety disorder 12/08/2015  . Panic attacks 07/28/2015  . Sensorineural hearing loss 03/27/2013  . Vitamin D deficiency 07/03/2012  . H/O aortic dissection 05/12/2012  . Class 2 obesity due to excess calories with body mass index (BMI) of 35.0 to 35.9 in adult 11/21/2011  . Obesity (BMI 30.0-34.9) 11/21/2011  . Routine general medical examination at a health care facility 09/20/2011  . S/P AVR (aortic valve replacement) 01/07/2011  . CORONARY ARTERY BYPASS GRAFT, HX OF 01/07/2011  . Arthritis 12/20/2010  . Back pain 12/15/2010  . Dissection of thoracic aorta ([place] 12/11/2010  . H/O dissecting abdominal aortic aneurysm repair 08/24/2010  . IRRITABLE BOWEL SYNDROME 09/09/2009  . Obstructive sleep apnea 08/04/2009  . External hemorrhoid 06/26/2009  . Osteoporosis 01/09/2009  .  Hyperlipidemia 07/26/2007  . Depression with anxiety 07/26/2007  . Essential hypertension 07/26/2007  . Allergic rhinitis 07/26/2007  . Asthma 07/26/2007  . GERD 07/26/2007  . Osteoarthritis 07/26/2007  . Ocular migraine 07/26/2007  . Mild intermittent asthma without complication [id number]  MJanna Arch PT, DPT   08/08/2020, 11:33 AM  [place] [facility] [address] [place] [address]Phone: [phone]  Fax:  [phone] Name: CTERRISHA LOPATAMRN: [id number] of Birth: 114-Mar-1957

## 2020-08-12 ENCOUNTER — Ambulatory Visit: Payer: Medicare HMO

## 2020-08-12 ENCOUNTER — Other Ambulatory Visit: Payer: Self-pay

## 2020-08-12 DIAGNOSIS — M542 Cervicalgia: Secondary | ICD-10-CM

## 2020-08-12 DIAGNOSIS — M545 Low back pain, unspecified: Secondary | ICD-10-CM

## 2020-08-12 DIAGNOSIS — R293 Abnormal posture: Secondary | ICD-10-CM

## 2020-08-12 DIAGNOSIS — M546 Pain in thoracic spine: Secondary | ICD-10-CM | POA: Diagnosis not present

## 2020-08-12 DIAGNOSIS — M5412 Radiculopathy, cervical region: Secondary | ICD-10-CM | POA: Diagnosis not present

## 2020-08-12 DIAGNOSIS — G8929 Other chronic pain: Secondary | ICD-10-CM | POA: Diagnosis not present

## 2020-08-12 NOTE — Therapy (Signed)
[facility] [address] [place], [place], [address] Phone: [phone]   Fax:  [phone]  Physical Therapy Treatment  Patient Details  Name: Rachel Torres MRN: [id number] Date of Birth: 02/02/1956 Referring Provider (PT): Tower, Marne A MD   Encounter Date: 08/12/2020   PT End of Session - 08/13/20 0931    Visit Number 21    Number of Visits 28    Date for PT Re-Evaluation 09/02/20    Authorization Type 1/10 8/20    Authorization Time Period back pain added to POC on 06/17/20    PT Start Time 1515    PT Stop Time 1559    PT Time Calculation (min) 44 min    Activity Tolerance Patient tolerated treatment well    Behavior During Therapy WFL for tasks assessed/performed           Past Medical History:  Diagnosis Date  . AAA (abdominal aortic aneurysm) (HCC)    a. Chronic w/o evidence of aneurysmal dil on CTA 01/2016.  . Alcohol abuse   . Allergy   . Anemia   . Anxiety   . CAD (coronary artery disease)    a. 08/2010 s/p CABG x 1 (VG->RCA) @ time of Ao dissection repair; b. 01/2016 Lexiscan MV: mid antsept/apical defect w/ ? peri-infarct ischemia-->likely attenuation-->Med Rx.  . Chewing difficulty   . Chronic combined systolic (congestive) and diastolic (congestive) heart failure (HCC)    a. 2012 EF 30-35%; b. 04/2015 EF 40-45%; c. 01/2016 Echo: Ef 55-65%, nl AoV bioprosthesis, nl RV, nl PASP.  . Constipation   . Depression   . Edema of both lower extremities   . Gallbladder sludge   . GERD (gastroesophageal reflux disease)   . Heart failure (HCC)   . Heart valve problem   . History of Bicuspid Aortic Valve    a. 08/2010 s/p AVR @ time of Ao dissection repair; b. 01/2016 Echo: Ef 55-65%, nl AoV bioprosthesis, nl RV, nl PASP.  . Hx of repair of dissecting thoracic aortic aneurysm, Stanford type A    a. 08/2010 s/p repair with AVR and VG->RCA; b. 01/2016 CTA: stable appearance of Asc Thoracic Aortic graft.  Opacification of flase lumen of chronic abd Ao dissection w/ retrograde flow through lumbar arteries. No aneurysmal dil of flase lumen.  . Hyperlipidemia   . Hypertensive heart disease   . IBS (irritable bowel syndrome)   . Internal hemorrhoids   . Kidney problem   . Marfan syndrome    pt denies  . Migraine   . Obstructive sleep apnea   . Osteoarthritis   . Osteoporosis   . Palpitations   . Pneumonia   . RA (rheumatoid arthritis) (HCC)    a. Followed by Dr. Beakman    Past Surgical History:  Procedure Laterality Date  . ABDOMINAL AORTIC ANEURYSM REPAIR    . CARDIAC CATHETERIZATION  2001  . CESAREAN SECTION  1986 & 1991  . CHOLECYSTECTOMY    . COLONOSCOPY    . ESOPHAGOGASTRODUODENOSCOPY (EGD) WITH PROPOFOL N/A 05/08/2020   Procedure: ESOPHAGOGASTRODUODENOSCOPY (EGD) WITH PROPOFOL;  Surgeon: Tahiliani, Varnita B, MD;  Location: [facility] ENDOSCOPY;  Service: Endoscopy;  Laterality: N/A;  . FRACTURE SURGERY Right    shoulder replacement  . HEMORRHOID BANDING    . ORIF DISTAL RADIUS FRACTURE Left   . UPPER GASTROINTESTINAL ENDOSCOPY      There were no vitals filed for this visit.   Subjective Assessment - 08/13/20 0927      Subjective Patient reports her low back is not hurting as much however she is having more radiating symptoms to her R arm from her neck/upper back.    Pertinent History Patient has been seen by this therapist in the past for back related injuries/pain. Was last seen by this author in December of 2019. Since discharge patient has had COVID and an upper GI due to stomach issues. PMH includes aorta type A dissection (2011) with aortic valve replacement, bypass of R Coronary artery, hypertension, hyperlipidemia, CHF, pre-diabetes, AFIB, anxiety, depression, arthritis, GERD, atypical migraine, asthma, allergic rhinitis. 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 Does now have help with aide.    Limitations Sitting;Reading;Lifting;Standing;Walking;Writing;Other  (comment);House hold activities    How long can you sit comfortably? 20 mins before whole body discomfort    How long can you stand comfortably? 5 mins  or less: lower back    How long can you walk comfortably? 2 minutes    Patient Stated Goals to decrease pain and migraines    Currently in Pain? Yes    Pain Score 4     Pain Location Back    Pain Orientation Upper;Lower;Mid    Pain Descriptors / Indicators Aching;Radiating    Pain Type Chronic pain    Pain Onset More than a month ago    Pain Frequency Constant                Treatment:    Manual Prone:              CPAs and UPAs grade II-IV to thoracic and lumbar spine, 5x 10 seconds each level. Hypomobility throughout with reduction with repetition. T10 and L2 hypomobile   Inferior SI : center and lateral crest inferior mobilizations grade II-III pain relieving x2 minutes  J mobilization to upper thoracic/lower cervical for reduced thoracic kyphosis. 3x30 seconds                  Supine: SAD with belt in figure four position 5x30 second inferior glide direction  SAD with hooklying position in inferior slight lateral glide 5x 30 second holds with belt  Piriformis stretch with PT overpressure 2x 60 seconds  Median nerve glide L and R UE 15x  Seated:  Trigger point/soft tissue release with movement to improve soft tissue limited muscle tissue length of upper trap, levator scap, cervical paraspinals.x 6 minutes   scapular retraction and depression with PT overpressure and rotational component with glenohumeral and complex addition x 3 minutes   TherEx Seated              Posterior pelvic tilt 15 x 5 second holds  Postural correction 15x 3 second holds   Supine :             Posterior pelvic tilt 15x 3 second hold  Standing:          core activation with march 20x          8x STS with pelvic tuck                Pt educated throughout session about proper posture and technique with exercises. Improved exercise  technique, movement   Patient initially presents with radiating symptoms to arm that improves with manual and therex. Reduced pain additionally reported and core activation in seated and standing tolerated well. Pain is now centralizing and patient is becoming more aware of posture with decreased need for extrinsic cueing.  Patient will continue to benefit  from skilled physical therapy to reduce pain, improve spinal mobility, and allow patient better quality of life.                    PT Education - 08/13/20 0928    Education provided Yes    Education Details posture, exercise technique, body mechanics, manual    Person(s) Educated Patient    Methods Explanation;Demonstration;Tactile cues;Verbal cues    Comprehension Verbalized understanding;Returned demonstration;Verbal cues required;Tactile cues required            PT Short Term Goals - 08/08/20 1010      PT SHORT TERM GOAL #1   Title Patient will be independent in home exercise program to improve strength/mobility and decrease pain for better functional independence with ADLs.    Baseline 5/26: HEP next session 7/14; HEP compliant    Time 2    Period Weeks    Status Achieved    Target Date 05/28/20      PT SHORT TERM GOAL #2   Title Patient will report a worst pain of 7/10 on VAS in head to improve tolerance with ADLs and reduced symptoms with activities.    Baseline 5/26: 9/10 7/14: 7/10    Time 2    Period Weeks    Status Achieved    Target Date 05/28/20      PT SHORT TERM GOAL #3   Title Patient will report a worst pain of 6/10 on VAS in thoracic and lumbar spine to improve tolerance with ADLs and reduced symptoms with activities.    Baseline 6/29: 8/10 pain 7/14: 8/10 8/20: thoracic 4/10 lumbar 8/10    Time 2    Period Weeks    Status Partially Met    Target Date 07/23/20             PT Long Term Goals - 08/08/20 1011      PT LONG TERM GOAL #1   Title Patient will report a worst pain of 3/10  on VAS in her head/migraines to improve tolerance with ADLs and reduced symptoms with activities    Baseline 5/26: 9/10 7/14: 7/10 8/20: 7/10    Time 8    Period Weeks    Status Partially Met    Target Date 09/02/20      PT LONG TERM GOAL #2   Title Patient will increase FOTO score to equal to or greater than   45/100  to demonstrate statistically significant improvement in mobility and quality of life.    Baseline 5/26: 44 with risk adjusted 33/100 7/14: 38.7% 8/20: 43%    Time 8    Period Weeks    Status Partially Met    Target Date 09/02/20      PT LONG TERM GOAL #3   Title Patient will reduce dizziness handicap inventory score to <30, for less dizziness with ADLs and increased safety with home and work tasks.    Baseline 5/26: 40 7/14: 34% 8/20: 32%    Time 8    Period Weeks    Status Partially Met    Target Date 09/02/20      PT LONG TERM GOAL #4   Title Patient will improve cervical rotation ROM to >/=60 degrees bilaterally to improve functional mobility and demonstrate improved muscle tissue length    Baseline 5/26: R 46 L 42 7/14: assess next session 7/21: R 51 L 49 8/20: L 60 R 51    Time 8    Period Weeks      Status Partially Met    Target Date 09/02/20      PT LONG TERM GOAL #5   Title Patient will report a worst pain of 3/10 on VAS in  thoracic and lumbar spine    to improve tolerance with ADLs and reduced symptoms with activities and caregiver duties    Baseline 6/29: 8/10 7/13: 8/10 8/20: thoracic 4/10 lumbar 8/10    Time 8    Period Weeks    Status Partially Met    Target Date 09/02/20      PT LONG TERM GOAL #6   Title Patient will tolerate sitting unsupported demonstrating erect sitting posture with minimal thoracic kyphosis for 15+ minutes with maximum of 5/10 back pain to demonstrate improved back extensor strength and improved sitting tolerance.    Baseline 6/29: painful and limited 7/14: can sit 15 minutes but has increasing forward head posture with  prolonged hold 8/20: could drive 1 hour but had increase of pain to 8/10    Time 8    Period Weeks    Status Partially Met    Target Date 09/02/20      PT LONG TERM GOAL #7   Title Patient will reduce modified Oswestry score to <20 as to demonstrate minimal disability with ADLs including improved sleeping tolerance, walking/sitting tolerance etc for better mobility with ADLs.    Baseline 6/29: 46% 7/13: 49% 8/20: 56%    Time 8    Period Weeks    Status Partially Met    Target Date 09/02/20                 Plan - 08/13/20 0933    Clinical Impression Statement Patient initially presents with radiating symptoms to arm that improves with manual and therex. Reduced pain additionally reported and core activation in seated and standing tolerated well. Pain is now centralizing and patient is becoming more aware of posture with decreased need for extrinsic cueing.  Patient will continue to benefit from skilled physical therapy to reduce pain, improve spinal mobility, and allow patient better quality of life.    Personal Factors and Comorbidities Age;Comorbidity 3+;Fitness;Finances;Past/Current Experience;Social Background;Time since onset of injury/illness/exacerbation;Other    Comorbidities aorta type A dissection (2011) with aortic valve replacement, bypass of R Coronary artery, hypertension, hyperlipidemia, CHF, pre-diabetes, AFIB, anxiety, depression, arthritis, GERD, atypical migraine, asthma, allergic rhinitis.    Examination-Activity Limitations Bend;Caring for Others;Carry;Dressing;Stairs;Squat;Sit;Reach Overhead;Locomotion Level;Lift;Stand;Toileting;Transfers    Examination-Participation Restrictions Church;Cleaning;Community Activity;Interpersonal Relationship;Driving;Laundry;Volunteer;Shop;Personal Finances;Meal Prep;Medication Management;Yard Work    Stability/Clinical Decision Making Evolving/Moderate complexity    Rehab Potential Fair    Clinical Impairments Affecting Rehab  Potential + CAD testing, history of AAA, anemia, anxiety, asthma, CAD, chronic combined ystolic heart failure, GERD, Marfan syndrome, OA, RA (+) good work ethic, motivated, age     PT Frequency 2x / week    PT Duration 8 weeks    PT Treatment/Interventions ADLs/Self Care Home Management;Aquatic Therapy;Vestibular;Vasopneumatic Device;Taping;Passive range of motion;Dry needling;Energy conservation;Manual techniques;Therapeutic exercise;Balance training;Neuromuscular re-education;Patient/family education;Therapeutic activities;Functional mobility training;Stair training;Gait training;Electrical Stimulation;Iontophoresis 4mg/ml Dexamethasone;Moist Heat;Traction;Ultrasound;Cryotherapy;Canalith Repostioning;Joint Manipulations;Spinal Manipulations    PT Next Visit Plan back interventions; posture    PT Home Exercise Plan Educated in seated Lev Scap and U/T stretches for HEP, as well as scap pinches and chin tucks.    Consulted and Agree with Plan of Care Patient           Patient will benefit from skilled therapeutic intervention in order to improve the following deficits and impairments:  Cardiopulmonary status limiting   activity, Decreased activity tolerance, Decreased endurance, Decreased mobility, Decreased range of motion, Decreased strength, Difficulty walking, Dizziness, Hypomobility, Impaired flexibility, Impaired perceived functional ability, Increased muscle spasms, Impaired UE functional use, Impaired vision/preception, Obesity, Postural dysfunction, Improper body mechanics, Pain, Abnormal gait  Visit Diagnosis: Cervicalgia  Chronic low back pain, unspecified back pain laterality, unspecified whether sciatica present  Abnormal posture  Pain in thoracic spine     Problem List Patient Active Problem List   Diagnosis Date Noted  . Acute bronchitis with bronchospasm 05/28/2020  . Diarrhea 04/11/2020  . Epigastric pain 02/19/2020  . Dark stools 02/19/2020  . PAF (paroxysmal atrial  fibrillation) (HCC) 02/05/2019  . Shortness of breath 01/27/2019  . Irregular surface of cornea 12/25/2018  . HPV (human papilloma virus) infection 12/01/2016  . Screening mammogram, encounter for 11/29/2016  . Estrogen deficiency 11/29/2016  . Encounter for routine gynecological examination 11/29/2016  . Grade III hemorrhoids 10/06/2016  . Tachycardia 08/14/2016  . Hemorrhoids 08/13/2016  . Atypical migraine 08/03/2016  . CAD (coronary artery disease)   . AAA (abdominal aortic aneurysm) (HCC)   . Chronic combined systolic (congestive) and diastolic (congestive) heart failure (HCC)   . Prediabetes 02/11/2016  . Generalized anxiety disorder 12/08/2015  . Panic attacks 07/28/2015  . Sensorineural hearing loss 03/27/2013  . Vitamin D deficiency 07/03/2012  . H/O aortic dissection 05/12/2012  . Class 2 obesity due to excess calories with body mass index (BMI) of 35.0 to 35.9 in adult 11/21/2011  . Obesity (BMI 30.0-34.9) 11/21/2011  . Routine general medical examination at a health care facility 09/20/2011  . S/P AVR (aortic valve replacement) 01/07/2011  . CORONARY ARTERY BYPASS GRAFT, HX OF 01/07/2011  . Arthritis 12/20/2010  . Back pain 12/15/2010  . Dissection of thoracic aorta (HCC) 12/11/2010  . H/O dissecting abdominal aortic aneurysm repair 08/24/2010  . IRRITABLE BOWEL SYNDROME 09/09/2009  . Obstructive sleep apnea 08/04/2009  . External hemorrhoid 06/26/2009  . Osteoporosis 01/09/2009  . Hyperlipidemia 07/26/2007  . Depression with anxiety 07/26/2007  . Essential hypertension 07/26/2007  . Allergic rhinitis 07/26/2007  . Asthma 07/26/2007  . GERD 07/26/2007  . Osteoarthritis 07/26/2007  . Ocular migraine 07/26/2007  . Mild intermittent asthma without complication 07/26/2007   Marina Moser, PT, DPT   08/13/2020, 9:34 AM  [facility] [address] [place], [place], [address] Phone: [phone]   Fax:   [phone]  Name: Rachel Torres MRN: [id number] Date of Birth: 11/02/1956   

## 2020-08-13 ENCOUNTER — Ambulatory Visit (INDEPENDENT_AMBULATORY_CARE_PROVIDER_SITE_OTHER): Payer: Medicare HMO | Admitting: Psychology

## 2020-08-13 DIAGNOSIS — F4323 Adjustment disorder with mixed anxiety and depressed mood: Secondary | ICD-10-CM | POA: Diagnosis not present

## 2020-08-15 ENCOUNTER — Ambulatory Visit: Payer: Medicare HMO

## 2020-08-15 ENCOUNTER — Other Ambulatory Visit: Payer: Self-pay

## 2020-08-15 DIAGNOSIS — G8929 Other chronic pain: Secondary | ICD-10-CM | POA: Diagnosis not present

## 2020-08-15 DIAGNOSIS — M546 Pain in thoracic spine: Secondary | ICD-10-CM | POA: Diagnosis not present

## 2020-08-15 DIAGNOSIS — M545 Low back pain, unspecified: Secondary | ICD-10-CM

## 2020-08-15 DIAGNOSIS — M542 Cervicalgia: Secondary | ICD-10-CM | POA: Diagnosis not present

## 2020-08-15 DIAGNOSIS — M5412 Radiculopathy, cervical region: Secondary | ICD-10-CM | POA: Diagnosis not present

## 2020-08-15 DIAGNOSIS — R293 Abnormal posture: Secondary | ICD-10-CM | POA: Diagnosis not present

## 2020-08-15 NOTE — Therapy (Signed)
[place] MAIN [facility] SERVICES [address] [place], [place], [address] Phone: [phone]   Fax:  [phone]  Physical Therapy Treatment  Patient Details  Name: Rachel Torres MRN: [id number] Date of Birth: 27-Jan-1956 Referring Provider (PT): Glori Bickers, Wynelle Fanny MD   Encounter Date: 08/15/2020   PT End of Session - 08/15/20 1318    Visit Number 22    Number of Visits 28    Date for PT Re-Evaluation 09/02/20    PT Start Time 1050    PT Stop Time 1130    PT Time Calculation (min) 40 min    Activity Tolerance Patient tolerated treatment well;No increased pain;Patient limited by fatigue    Behavior During Therapy [facility] for tasks assessed/performed           Past Medical History:  Diagnosis Date  . AAA (abdominal aortic aneurysm) ([place])    a. Chronic w/o evidence of aneurysmal dil on CTA 01/2016.  Marland Kitchen Alcohol abuse   . Allergy   . Anemia   . Anxiety   . CAD (coronary artery disease)    a. 08/2010 s/p CABG x 1 (VG->RCA) @ time of Ao dissection repair; b. 01/2016 Lexiscan MV: mid antsept/apical defect w/ ? peri-infarct ischemia-->likely attenuation-->Med Rx.  . Chewing difficulty   . Chronic combined systolic (congestive) and diastolic (congestive) heart failure ([place])    a. 2012 EF 30-35%; b. 04/2015 EF 40-45%; c. 01/2016 Echo: Ef 55-65%, nl AoV bioprosthesis, nl RV, nl PASP.  Marland Kitchen Constipation   . Depression   . Edema of both lower extremities   . Gallbladder sludge   . GERD (gastroesophageal reflux disease)   . Heart failure ([place])   . Heart valve problem   . History of Bicuspid Aortic Valve    a. 08/2010 s/p AVR @ time of Ao dissection repair; b. 01/2016 Echo: Ef 55-65%, nl AoV bioprosthesis, nl RV, nl PASP.  Marland Kitchen Hx of repair of dissecting thoracic aortic aneurysm, [place] type A    a. 08/2010 s/p repair with AVR and VG->RCA; b. 01/2016 CTA: stable appearance of Asc Thoracic Aortic graft. Opacification of flase lumen of chronic abd Ao dissection w/  retrograde flow through lumbar arteries. No aneurysmal dil of flase lumen.  . Hyperlipidemia   . Hypertensive heart disease   . IBS (irritable bowel syndrome)   . Internal hemorrhoids   . Kidney problem   . Marfan syndrome    pt denies  . Migraine   . Obstructive sleep apnea   . Osteoarthritis   . Osteoporosis   . Palpitations   . Pneumonia   . RA (rheumatoid arthritis) ([place])    a. Followed by Dr. [name]    Past Surgical History:  Procedure Laterality Date  . ABDOMINAL AORTIC ANEURYSM REPAIR    . CARDIAC CATHETERIZATION  2001  . [place]  . CHOLECYSTECTOMY    . COLONOSCOPY    . ESOPHAGOGASTRODUODENOSCOPY (EGD) WITH PROPOFOL N/A 05/08/2020   Procedure: ESOPHAGOGASTRODUODENOSCOPY (EGD) WITH PROPOFOL;  Surgeon: Virgel Manifold, MD;  Location: [facility] ENDOSCOPY;  Service: Endoscopy;  Laterality: N/A;  . FRACTURE SURGERY Right    shoulder replacement  . HEMORRHOID BANDING    . ORIF DISTAL RADIUS FRACTURE Left   . UPPER GASTROINTESTINAL ENDOSCOPY      There were no vitals filed for this visit.   Subjective Assessment - 08/15/20 1316    Subjective Pt doing well this date, reports a very busy week, 5 medical  appointments on Wednesday, all contributing to mildly mor etightness in neck. Pt reports trying to work on HEP whenever possible, even if in a medical waiting room.    Pertinent History Patient has been seen by this therapist in the past for back related injuries/pain. Was last seen by this author in December of 2019. Since discharge patient has had COVID and an upper GI due to stomach issues. PMH includes aorta type A dissection (2011) with aortic valve replacement, bypass of R Coronary artery, hypertension, hyperlipidemia, CHF, pre-diabetes, AFIB, anxiety, depression, arthritis, GERD, atypical migraine, asthma, allergic rhinitis. 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 Does now have help with aide.    Limitations  Sitting;Reading;Lifting;Standing;Walking;Writing;Other (comment);House hold activities    How long can you sit comfortably? 20 mins before whole body discomfort    How long can you stand comfortably? 5 mins  or less: lower back    How long can you walk comfortably? 2 minutes    Patient Stated Goals to decrease pain and migraines    Pain Score 6     Pain Location Neck          INTERVENTION THIS DATE:   Prone:  -CPAs grade IV to thoracic and lumbar spine, 3x7.5 seconds each level. Hypomobility throughout with reduction with repetition. T10 and L2 hypomobile  -Prone T (scapular retraction) 1x10x3secH -Prone W (scap depression) 1x10x3secH  -Prone I [place] Pan!) 1x10x3secH -Prone Left rhomboids trigger point release   Standing -Scapular T 1x10 c mirror biofeedback  -Scapular W 1x10 c mirror biofeedback  -Scapular I 1x10 c mirror biofeedback   Hooklying  -MFR suboccipitals, upper trap bilat, splenius capitis/cervicis bilat -ART to bilat upper trap   PT Short Term Goals - 08/08/20 1010      PT SHORT TERM GOAL #1   Title Patient will be independent in home exercise program to improve strength/mobility and decrease pain for better functional independence with ADLs.    Baseline 5/26: HEP next session 7/14; HEP compliant    Time 2    Period Weeks    Status Achieved    Target Date 05/28/20      PT SHORT TERM GOAL #2   Title Patient will report a worst pain of 7/10 on VAS in head to improve tolerance with ADLs and reduced symptoms with activities.    Baseline 5/26: 9/10 7/14: 7/10    Time 2    Period Weeks    Status Achieved    Target Date 05/28/20      PT SHORT TERM GOAL #3   Title Patient will report a worst pain of 6/10 on VAS in thoracic and lumbar spine to improve tolerance with ADLs and reduced symptoms with activities.    Baseline 6/29: 8/10 pain 7/14: 8/10 8/20: thoracic 4/10 lumbar 8/10    Time 2    Period Weeks    Status Partially Met    Target Date 07/23/20              PT Long Term Goals - 08/08/20 1011      PT LONG TERM GOAL #1   Title Patient will report a worst pain of 3/10 on VAS in her head/migraines to improve tolerance with ADLs and reduced symptoms with activities    Baseline 5/26: 9/10 7/14: 7/10 8/20: 7/10    Time 8    Period Weeks    Status Partially Met    Target Date 09/02/20      PT LONG TERM GOAL #2  Title Patient will increase FOTO score to equal to or greater than   45/100  to demonstrate statistically significant improvement in mobility and quality of life.    Baseline 5/26: 44 with risk adjusted 33/100 7/14: 38.7% 8/20: 43%    Time 8    Period Weeks    Status Partially Met    Target Date 09/02/20      PT LONG TERM GOAL #3   Title Patient will reduce dizziness handicap inventory score to <30, for less dizziness with ADLs and increased safety with home and work tasks.    Baseline 5/26: 40 7/14: 34% 8/20: 32%    Time 8    Period Weeks    Status Partially Met    Target Date 09/02/20      PT LONG TERM GOAL #4   Title Patient will improve cervical rotation ROM to >/=60 degrees bilaterally to improve functional mobility and demonstrate improved muscle tissue length    Baseline 5/26: R 46 L 42 7/14: assess next session 7/21: R 51 L 49 8/20: L 60 R 51    Time 8    Period Weeks    Status Partially Met    Target Date 09/02/20      PT LONG TERM GOAL #5   Title Patient will report a worst pain of 3/10 on VAS in  thoracic and lumbar spine    to improve tolerance with ADLs and reduced symptoms with activities and caregiver duties    Baseline 6/29: 8/10 7/13: 8/10 8/20: thoracic 4/10 lumbar 8/10    Time 8    Period Weeks    Status Partially Met    Target Date 09/02/20      PT LONG TERM GOAL #6   Title Patient will tolerate sitting unsupported demonstrating erect sitting posture with minimal thoracic kyphosis for 15+ minutes with maximum of 5/10 back pain to demonstrate improved back extensor strength and improved sitting  tolerance.    Baseline 6/29: painful and limited 7/14: can sit 15 minutes but has increasing forward head posture with prolonged hold 8/20: could drive 1 hour but had increase of pain to 8/10    Time 8    Period Weeks    Status Partially Met    Target Date 09/02/20      PT LONG TERM GOAL #7   Title Patient will reduce modified Oswestry score to <20 as to demonstrate minimal disability with ADLs including improved sleeping tolerance, walking/sitting tolerance etc for better mobility with ADLs.    Baseline 6/29: 46% 7/13: 49% 8/20: 56%    Time 8    Period Weeks    Status Partially Met    Target Date 09/02/20                 Plan - 08/15/20 1318    Clinical Impression Statement Pt tolerates session well this date. Asks for help with neck and scapular problems, but reports her hip is stable today for the most part, requires no immediate attention. Continued with manula therapies to address joint stiffness and discomfort, as well as soft tissue work to address spasms and trigger points in neck and periscapular area. Reviewed scapular/postural mobility/proprioception education to improve pt confidence in HEP. Pt able to complete session with exacerbation of any symptoms. No HEP updates this date.    Personal Factors and Comorbidities Age;Comorbidity 3+;Fitness;Finances;Past/Current Experience;Social Background;Time since onset of injury/illness/exacerbation;Other    Comorbidities aorta type A dissection (2011) with aortic valve replacement, bypass of R  Coronary artery, hypertension, hyperlipidemia, CHF, pre-diabetes, AFIB, anxiety, depression, arthritis, GERD, atypical migraine, asthma, allergic rhinitis.    Examination-Activity Limitations [place];Caring for Others;Carry;Dressing;Stairs;Squat;Sit;Reach Overhead;Locomotion Level;Lift;Stand;Toileting;Transfers    Examination-Participation Restrictions Church;Cleaning;Community Activity;Interpersonal  Relationship;Driving;Laundry;Volunteer;Shop;Personal Finances;Meal Prep;Medication Management;Yard Work    [employer] Evolving/Moderate complexity    Clinical Decision Making Moderate    Rehab Potential Fair    Clinical Impairments Affecting Rehab Potential + CAD testing, history of AAA, anemia, anxiety, asthma, CAD, chronic combined ystolic heart failure, GERD, Marfan syndrome, OA, RA (+) good work Psychologist, forensic, motivated, age     PT Frequency 2x / week    PT Duration 8 weeks    PT Treatment/Interventions ADLs/Self Care Home Management;Aquatic Therapy;Vestibular;Vasopneumatic Device;Taping;Passive range of motion;Dry needling;Energy conservation;Manual techniques;Therapeutic exercise;Balance training;Neuromuscular re-education;Patient/family education;Therapeutic activities;Functional mobility training;Stair training;Gait training;Electrical Stimulation;Iontophoresis 33m/ml Dexamethasone;Moist Heat;Traction;Ultrasound;Cryotherapy;Canalith Repostioning;Joint Manipulations;Spinal Manipulations    PT Next Visit Plan back interventions; posture    PT Home Exercise Plan Educated in seated Lev Scap and U/T stretches for HEP, as well as scap pinches and chin tucks.    Consulted and Agree with Plan of Care Patient           Patient will benefit from skilled therapeutic intervention in order to improve the following deficits and impairments:  Cardiopulmonary status limiting activity, Decreased activity tolerance, Decreased endurance, Decreased mobility, Decreased range of motion, Decreased strength, Difficulty walking, Dizziness, Hypomobility, Impaired flexibility, Impaired perceived functional ability, Increased muscle spasms, Impaired UE functional use, Impaired vision/preception, Obesity, Postural dysfunction, Improper body mechanics, Pain, Abnormal gait  Visit Diagnosis: Cervicalgia  Chronic low back pain, unspecified back pain laterality, unspecified whether sciatica  present  Abnormal posture  Pain in thoracic spine  Chronic midline low back pain, unspecified whether sciatica present  Radiculopathy, cervical region     Problem List Patient Active Problem List   Diagnosis Date Noted  . Acute bronchitis with bronchospasm 05/28/2020  . Diarrhea 04/11/2020  . Epigastric pain 02/19/2020  . Dark stools 02/19/2020  . PAF ([place]oxysmal atrial fibrillation) ([place] 02/05/2019  . Shortness of breath 01/27/2019  . Irregular surface of cornea 12/25/2018  . HPV (human papilloma virus) infection 12/01/2016  . Screening mammogram, encounter for 11/29/2016  . Estrogen deficiency 11/29/2016  . Encounter for routine gynecological examination 11/29/2016  . Grade III hemorrhoids 10/06/2016  . Tachycardia 08/14/2016  . Hemorrhoids 08/13/2016  . Atypical migraine 08/03/2016  . CAD (coronary artery disease)   . [place]   . Chronic combined systolic (congestive) and diastolic (congestive) heart failure ([place]   . Prediabetes 02/11/2016  . Generalized anxiety disorder 12/08/2015  . Panic attacks 07/28/2015  . Sensorineural hearing loss 03/27/2013  . Vitamin D deficiency 07/03/2012  . H/O aortic dissection 05/12/2012  . Class 2 obesity due to excess calories with body mass index (BMI) of 35.0 to 35.9 in adult 11/21/2011  . Obesity (BMI 30.0-34.9) 11/21/2011  . Routine general medical examination at a health care facility 09/20/2011  . S/P AVR (aortic valve replacement) 01/07/2011  . CORONARY ARTERY BYPASS GRAFT, HX OF 01/07/2011  . Arthritis 12/20/2010  . Back pain 12/15/2010  . Dissection of thoracic aorta ([place] 12/11/2010  . H/O dissecting abdominal aortic aneurysm repair 08/24/2010  . IRRITABLE BOWEL SYNDROME 09/09/2009  . Obstructive sleep apnea 08/04/2009  . External hemorrhoid 06/26/2009  . Osteoporosis 01/09/2009  . Hyperlipidemia 07/26/2007  . Depression with anxiety 07/26/2007  . Essential hypertension 07/26/2007  .  Allergic rhinitis 07/26/2007  . Asthma 07/26/2007  . GERD 07/26/2007  .  Osteoarthritis 07/26/2007  . Ocular migraine 07/26/2007  . Mild intermittent asthma without complication 12/92/9090   1:26 PM, 08/15/20 Etta Grandchild, PT, DPT Physical Therapist - [place] [phone]     Etta Grandchild 08/15/2020, 1:22 PM  [place] MAIN [facility] SERVICES [address] [place], [place], [address] Phone: [phone]   Fax:  [phone]  Name: Rachel Torres MRN: [id number] Date of Birth: [birth year]

## 2020-08-19 ENCOUNTER — Encounter (INDEPENDENT_AMBULATORY_CARE_PROVIDER_SITE_OTHER): Payer: Self-pay | Admitting: Family Medicine

## 2020-08-20 ENCOUNTER — Ambulatory Visit (INDEPENDENT_AMBULATORY_CARE_PROVIDER_SITE_OTHER): Payer: Medicare HMO | Admitting: Family Medicine

## 2020-08-21 ENCOUNTER — Ambulatory Visit: Payer: Medicare HMO | Attending: Family Medicine

## 2020-08-21 ENCOUNTER — Other Ambulatory Visit: Payer: Self-pay

## 2020-08-21 DIAGNOSIS — M545 Low back pain, unspecified: Secondary | ICD-10-CM

## 2020-08-21 DIAGNOSIS — R293 Abnormal posture: Secondary | ICD-10-CM | POA: Diagnosis not present

## 2020-08-21 DIAGNOSIS — G8929 Other chronic pain: Secondary | ICD-10-CM | POA: Insufficient documentation

## 2020-08-21 DIAGNOSIS — M542 Cervicalgia: Secondary | ICD-10-CM | POA: Insufficient documentation

## 2020-08-21 DIAGNOSIS — M546 Pain in thoracic spine: Secondary | ICD-10-CM | POA: Diagnosis not present

## 2020-08-21 NOTE — Therapy (Signed)
[place] MAIN [facility] SERVICES [address] [place], [place], [address] Phone: [phone]   Fax:  [phone]  Physical Therapy Treatment  Patient Details  Name: Rachel Torres MRN: [id number] Date of Birth: 11/02/1956 Referring Provider (PT): Glori Bickers, Wynelle Fanny MD   Encounter Date: 08/21/2020   PT End of Session - 08/21/20 1335    Visit Number 23    Number of Visits 28    Date for PT Re-Evaluation 09/02/20    Authorization Type 3/10 8/20    Authorization Time Period back pain added to POC on 06/17/20    PT Start Time 1300    PT Stop Time 1344    PT Time Calculation (min) 44 min    Activity Tolerance Patient tolerated treatment well    Behavior During Therapy [facility] for tasks assessed/performed           Past Medical History:  Diagnosis Date   AAA (abdominal aortic aneurysm) ([place])    a. Chronic w/o evidence of aneurysmal dil on CTA 01/2016.   Alcohol abuse    Allergy    Anemia    Anxiety    CAD (coronary artery disease)    a. 08/2010 s/p CABG x 1 (VG->RCA) @ time of Ao dissection repair; b. 01/2016 Lexiscan MV: mid antsept/apical defect w/ ? peri-infarct ischemia-->likely attenuation-->Med Rx.   Chewing difficulty    Chronic combined systolic (congestive) and diastolic (congestive) heart failure ([place])    a. 2012 EF 30-35%; b. 04/2015 EF 40-45%; c. 01/2016 Echo: Ef 55-65%, nl AoV bioprosthesis, nl RV, nl PASP.   Constipation    Depression    Edema of both lower extremities    Gallbladder sludge    GERD (gastroesophageal reflux disease)    Heart failure (HCC)    Heart valve problem    History of Bicuspid Aortic Valve    a. 08/2010 s/p AVR @ time of Ao dissection repair; b. 01/2016 Echo: Ef 55-65%, nl AoV bioprosthesis, nl RV, nl PASP.   Hx of repair of dissecting thoracic aortic aneurysm, [place] type A    a. 08/2010 s/p repair with AVR and VG->RCA; b. 01/2016 CTA: stable appearance of Asc Thoracic Aortic graft.  Opacification of flase lumen of chronic abd Ao dissection w/ retrograde flow through lumbar arteries. No aneurysmal dil of flase lumen.   Hyperlipidemia    Hypertensive heart disease    IBS (irritable bowel syndrome)    Internal hemorrhoids    Kidney problem    Marfan syndrome    pt denies   Migraine    Obstructive sleep apnea    Osteoarthritis    Osteoporosis    Palpitations    Pneumonia    RA (rheumatoid arthritis) ([place])    a. Followed by Dr. [name]    Past Surgical History:  Procedure Laterality Date   ABDOMINAL AORTIC ANEURYSM REPAIR     CARDIAC CATHETERIZATION  2001   [place]     ESOPHAGOGASTRODUODENOSCOPY (EGD) WITH PROPOFOL N/A 05/08/2020   Procedure: ESOPHAGOGASTRODUODENOSCOPY (EGD) WITH PROPOFOL;  Surgeon: Virgel Manifold, MD;  Location: [facility] ENDOSCOPY;  Service: Endoscopy;  Laterality: N/A;   FRACTURE SURGERY Right    shoulder replacement   HEMORRHOID BANDING     ORIF DISTAL RADIUS FRACTURE Left    UPPER GASTROINTESTINAL ENDOSCOPY      There were no vitals filed for this visit.   Subjective Assessment - 08/21/20 1329  Subjective Patient had a covid exposure, negative test results. 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    Pertinent History Patient has been seen by this therapist in the past for back related injuries/pain. Was last seen by this author in December of 2019. Since discharge patient has had COVID and an upper GI due to stomach issues. PMH includes aorta type A dissection (2011) with aortic valve replacement, bypass of R Coronary artery, hypertension, hyperlipidemia, CHF, pre-diabetes, AFIB, anxiety, depression, arthritis, GERD, atypical migraine, asthma, allergic rhinitis. 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 Does now have help with aide.    Limitations Sitting;Reading;Lifting;Standing;Walking;Writing;Other (comment);House  hold activities    How long can you sit comfortably? 20 mins before whole body discomfort    How long can you stand comfortably? 5 mins  or less: lower back    How long can you walk comfortably? 2 minutes    Patient Stated Goals to decrease pain and migraines    Currently in Pain? Yes    Pain Score 2     Pain Location Back    Pain Orientation Upper;Mid;Lower    Pain Descriptors / Indicators Aching    Pain Type Chronic pain    Pain Onset More than a month ago    Pain Frequency Constant               Treatment:     Prone:              CPAs and UPAs grade II-IV to thoracic and lumbar spine, 5x 10 seconds each level. Hypomobility throughout with reduction with repetition. T10 and L2 hypomobile              Inferior SI : center and lateral crest inferior mobilizations grade II-III pain relieving x2 minutes   J mobilization to upper thoracic/lower cervical for reduced thoracic kyphosis. 3x30 seconds   quadruped: Child pose 30 seconds x2  Trials Cat cow 10x UE reach 10x each UE   supine with heat under low back -weighted ball (2000 gr)   -overhead stabilization with march 10x each LE   -posterior pelvic tilt 15x 5 second holds  Seated: Swiss ball forward trunk rollout 10x 10 second holds forward, diagonal L, diagonal R Trigger point/soft tissue release with movement to improve soft tissue limited muscle tissue length of upper trap, levator scap, cervical paraspinals.x 6 minutes   Pt educated throughout session about proper posture and technique with exercises. Improved exercise technique, movement           PT Education - 08/21/20 1334    Education provided Yes    Education Details exercise technique, body mechanics    Person(s) Educated Patient    Methods Explanation;Demonstration;Tactile cues;Verbal cues    Comprehension Verbalized understanding;Returned demonstration;Verbal cues required;Tactile cues required            PT Short Term Goals - 08/08/20 1010       PT SHORT TERM GOAL #1   Title Patient will be independent in home exercise program to improve strength/mobility and decrease pain for better functional independence with ADLs.    Baseline 5/26: HEP next session 7/14; HEP compliant    Time 2    Period Weeks    Status Achieved    Target Date 05/28/20      PT SHORT TERM GOAL #2   Title Patient will report a worst pain of 7/10 on VAS in head to improve tolerance with ADLs and  reduced symptoms with activities.    Baseline 5/26: 9/10 7/14: 7/10    Time 2    Period Weeks    Status Achieved    Target Date 05/28/20      PT SHORT TERM GOAL #3   Title Patient will report a worst pain of 6/10 on VAS in thoracic and lumbar spine to improve tolerance with ADLs and reduced symptoms with activities.    Baseline 6/29: 8/10 pain 7/14: 8/10 8/20: thoracic 4/10 lumbar 8/10    Time 2    Period Weeks    Status Partially Met    Target Date 07/23/20             PT Long Term Goals - 08/08/20 1011      PT LONG TERM GOAL #1   Title Patient will report a worst pain of 3/10 on VAS in her head/migraines to improve tolerance with ADLs and reduced symptoms with activities    Baseline 5/26: 9/10 7/14: 7/10 8/20: 7/10    Time 8    Period Weeks    Status Partially Met    Target Date 09/02/20      PT LONG TERM GOAL #2   Title Patient will increase FOTO score to equal to or greater than   45/100  to demonstrate statistically significant improvement in mobility and quality of life.    Baseline 5/26: 44 with risk adjusted 33/100 7/14: 38.7% 8/20: 43%    Time 8    Period Weeks    Status Partially Met    Target Date 09/02/20      PT LONG TERM GOAL #3   Title Patient will reduce dizziness handicap inventory score to <30, for less dizziness with ADLs and increased safety with home and work tasks.    Baseline 5/26: 40 7/14: 34% 8/20: 32%    Time 8    Period Weeks    Status Partially Met    Target Date 09/02/20      PT LONG TERM GOAL #4   Title  Patient will improve cervical rotation ROM to >/=60 degrees bilaterally to improve functional mobility and demonstrate improved muscle tissue length    Baseline 5/26: R 46 L 42 7/14: assess next session 7/21: R 51 L 49 8/20: L 60 R 51    Time 8    Period Weeks    Status Partially Met    Target Date 09/02/20      PT LONG TERM GOAL #5   Title Patient will report a worst pain of 3/10 on VAS in  thoracic and lumbar spine    to improve tolerance with ADLs and reduced symptoms with activities and caregiver duties    Baseline 6/29: 8/10 7/13: 8/10 8/20: thoracic 4/10 lumbar 8/10    Time 8    Period Weeks    Status Partially Met    Target Date 09/02/20      PT LONG TERM GOAL #6   Title Patient will tolerate sitting unsupported demonstrating erect sitting posture with minimal thoracic kyphosis for 15+ minutes with maximum of 5/10 back pain to demonstrate improved back extensor strength and improved sitting tolerance.    Baseline 6/29: painful and limited 7/14: can sit 15 minutes but has increasing forward head posture with prolonged hold 8/20: could drive 1 hour but had increase of pain to 8/10    Time 8    Period Weeks    Status Partially Met    Target Date 09/02/20  PT LONG TERM GOAL #7   Title Patient will reduce modified Oswestry score to <20 as to demonstrate minimal disability with ADLs including improved sleeping tolerance, walking/sitting tolerance etc for better mobility with ADLs.    Baseline 6/29: 46% 7/13: 49% 8/20: 56%    Time 8    Period Weeks    Status Partially Met    Target Date 09/02/20                 Plan - 08/21/20 1454    Clinical Impression Statement Patient tolerates core stability interventions in open chain and close chained positioning. Occasional trunk spasms occured bilaterally with nonspecific motions requiring a moment for pain to subside. Radiating pain is decreased with all pain centralized at the end of this session. Patient will continue to  benefit from skilled physical therapy to reduce pain, improve spinal mobility, and allow patient better quality of life.    Personal Factors and Comorbidities Age;Comorbidity 3+;Fitness;Finances;Past/Current Experience;Social Background;Time since onset of injury/illness/exacerbation;Other    Comorbidities aorta type A dissection (2011) with aortic valve replacement, bypass of R Coronary artery, hypertension, hyperlipidemia, CHF, pre-diabetes, AFIB, anxiety, depression, arthritis, GERD, atypical migraine, asthma, allergic rhinitis.    Examination-Activity Limitations [place];Caring for Others;Carry;Dressing;Stairs;Squat;Sit;Reach Overhead;Locomotion Level;Lift;Stand;Toileting;Transfers    Examination-Participation Restrictions Church;Cleaning;Community Activity;Interpersonal Relationship;Driving;Laundry;Volunteer;Shop;Personal Finances;Meal Prep;Medication Management;Yard Work    Product manager    Rehab Potential Fair    Clinical Impairments Affecting Rehab Potential + CAD testing, history of AAA, anemia, anxiety, asthma, CAD, chronic combined ystolic heart failure, GERD, Marfan syndrome, OA, RA (+) good work Psychologist, forensic, motivated, age     PT Frequency 2x / week    PT Duration 8 weeks    PT Treatment/Interventions ADLs/Self Care Home Management;Aquatic Therapy;Vestibular;Vasopneumatic Device;Taping;Passive range of motion;Dry needling;Energy conservation;Manual techniques;Therapeutic exercise;Balance training;Neuromuscular re-education;Patient/family education;Therapeutic activities;Functional mobility training;Stair training;Gait training;Electrical Stimulation;Iontophoresis 11m/ml Dexamethasone;Moist Heat;Traction;Ultrasound;Cryotherapy;Canalith Repostioning;Joint Manipulations;Spinal Manipulations    PT Next Visit Plan back interventions; posture    PT Home Exercise Plan Educated in seated Lev Scap and U/T stretches for HEP, as well as scap pinches and chin  tucks.    Consulted and Agree with Plan of Care Patient           Patient will benefit from skilled therapeutic intervention in order to improve the following deficits and impairments:  Cardiopulmonary status limiting activity, Decreased activity tolerance, Decreased endurance, Decreased mobility, Decreased range of motion, Decreased strength, Difficulty walking, Dizziness, Hypomobility, Impaired flexibility, Impaired perceived functional ability, Increased muscle spasms, Impaired UE functional use, Impaired vision/preception, Obesity, Postural dysfunction, Improper body mechanics, Pain, Abnormal gait  Visit Diagnosis: Cervicalgia  Chronic low back pain, unspecified back pain laterality, unspecified whether sciatica present  Abnormal posture  Pain in thoracic spine     Problem List Patient Active Problem List   Diagnosis Date Noted   Acute bronchitis with bronchospasm 05/28/2020   Diarrhea 04/11/2020   Epigastric pain 02/19/2020   Dark stools 02/19/2020   PAF ([place]oxysmal atrial fibrillation) ([place] 02/05/2019   Shortness of breath 01/27/2019   Irregular surface of cornea 12/25/2018   HPV (human papilloma virus) infection 12/01/2016   Screening mammogram, encounter for 11/29/2016   Estrogen deficiency 11/29/2016   Encounter for routine gynecological examination 11/29/2016   Grade III hemorrhoids 10/06/2016   Tachycardia 08/14/2016   Hemorrhoids 08/13/2016   Atypical migraine 08/03/2016   CAD (coronary artery disease)    AAA (abdominal aortic aneurysm) (HCC)    Chronic combined systolic (congestive) and diastolic [place]) [place]  Prediabetes 02/11/2016   Generalized anxiety disorder 12/08/2015   Panic attacks 07/28/2015   Sensorineural hearing loss 03/27/2013   Vitamin D deficiency 07/03/2012   H/O aortic dissection 05/12/2012   Class 2 obesity due to excess calories with body mass index (BMI) of 35.0 to 35.9 in adult 11/21/2011    Obesity (BMI 30.0-34.9) 11/21/2011   Routine general medical examination at a health care facility 09/20/2011   S/P AVR (aortic valve replacement) 01/07/2011   CORONARY ARTERY BYPASS GRAFT, HX OF 01/07/2011   Arthritis 12/20/2010   Back pain 12/15/2010   Dissection of thoracic aorta ([place]) 12/11/2010   H/O dissecting abdominal aortic aneurysm repair 08/24/2010   IRRITABLE BOWEL SYNDROME 09/09/2009   Obstructive sleep apnea 08/04/2009   External hemorrhoid 06/26/2009   Osteoporosis 01/09/2009   Hyperlipidemia 07/26/2007   Depression with anxiety 07/26/2007   Essential hypertension 07/26/2007   Allergic rhinitis 07/26/2007   Asthma 07/26/2007   GERD 07/26/2007   Osteoarthritis 07/26/2007   Ocular migraine 07/26/2007   Mild intermittent asthma without complication 63/78/5885   Janna Arch, PT, DPT   08/21/2020, 2:56 PM  [place] MAIN [facility] SERVICES [address] [place], [place], [address] Phone: [phone]   Fax:  [phone]  Name: Rachel Torres MRN: [id number] Date of Birth: 04/01/1956

## 2020-08-26 ENCOUNTER — Ambulatory Visit: Payer: Medicare HMO

## 2020-08-26 ENCOUNTER — Other Ambulatory Visit: Payer: Self-pay

## 2020-08-26 DIAGNOSIS — M546 Pain in thoracic spine: Secondary | ICD-10-CM | POA: Diagnosis not present

## 2020-08-26 DIAGNOSIS — M542 Cervicalgia: Secondary | ICD-10-CM | POA: Diagnosis not present

## 2020-08-26 DIAGNOSIS — M545 Low back pain, unspecified: Secondary | ICD-10-CM

## 2020-08-26 DIAGNOSIS — R293 Abnormal posture: Secondary | ICD-10-CM | POA: Diagnosis not present

## 2020-08-26 DIAGNOSIS — G8929 Other chronic pain: Secondary | ICD-10-CM

## 2020-08-26 NOTE — Therapy (Signed)
[place] MAIN [facility] SERVICES [address] [place], [place], [address] Phone: [phone]   Fax:  [phone]  Physical Therapy Treatment  Patient Details  Name: Rachel Torres MRN: [id number] Date of Birth: Sep 04, 1956 Referring Provider (PT): Glori Bickers, Wynelle Fanny MD   Encounter Date: 08/26/2020   PT End of Session - 08/26/20 1805    Visit Number 24    Number of Visits 28    Date for PT Re-Evaluation 09/02/20    Authorization Type 4/10 8/20    Authorization Time Period back pain added to POC on 06/17/20    PT Start Time 1345    PT Stop Time 1429    PT Time Calculation (min) 44 min    Activity Tolerance Patient tolerated treatment well    Behavior During Therapy [facility] for tasks assessed/performed           Past Medical History:  Diagnosis Date  . AAA (abdominal aortic aneurysm) ([place])    a. Chronic w/o evidence of aneurysmal dil on CTA 01/2016.  Marland Kitchen Alcohol abuse   . Allergy   . Anemia   . Anxiety   . CAD (coronary artery disease)    a. 08/2010 s/p CABG x 1 (VG->RCA) @ time of Ao dissection repair; b. 01/2016 Lexiscan MV: mid antsept/apical defect w/ ? peri-infarct ischemia-->likely attenuation-->Med Rx.  . Chewing difficulty   . Chronic combined systolic (congestive) and diastolic (congestive) heart failure ([place])    a. 2012 EF 30-35%; b. 04/2015 EF 40-45%; c. 01/2016 Echo: Ef 55-65%, nl AoV bioprosthesis, nl RV, nl PASP.  Marland Kitchen Constipation   . Depression   . Edema of both lower extremities   . Gallbladder sludge   . GERD (gastroesophageal reflux disease)   . Heart failure ([place])   . Heart valve problem   . History of Bicuspid Aortic Valve    a. 08/2010 s/p AVR @ time of Ao dissection repair; b. 01/2016 Echo: Ef 55-65%, nl AoV bioprosthesis, nl RV, nl PASP.  Marland Kitchen Hx of repair of dissecting thoracic aortic aneurysm, [place] type A    a. 08/2010 s/p repair with AVR and VG->RCA; b. 01/2016 CTA: stable appearance of Asc Thoracic Aortic graft.  Opacification of flase lumen of chronic abd Ao dissection w/ retrograde flow through lumbar arteries. No aneurysmal dil of flase lumen.  . Hyperlipidemia   . Hypertensive heart disease   . IBS (irritable bowel syndrome)   . Internal hemorrhoids   . Kidney problem   . Marfan syndrome    pt denies  . Migraine   . Obstructive sleep apnea   . Osteoarthritis   . Osteoporosis   . Palpitations   . Pneumonia   . RA (rheumatoid arthritis) ([place])    a. Followed by Dr. [name]    Past Surgical History:  Procedure Laterality Date  . ABDOMINAL AORTIC ANEURYSM REPAIR    . CARDIAC CATHETERIZATION  2001  . [place]  . CHOLECYSTECTOMY    . COLONOSCOPY    . ESOPHAGOGASTRODUODENOSCOPY (EGD) WITH PROPOFOL N/A 05/08/2020   Procedure: ESOPHAGOGASTRODUODENOSCOPY (EGD) WITH PROPOFOL;  Surgeon: Virgel Manifold, MD;  Location: [facility] ENDOSCOPY;  Service: Endoscopy;  Laterality: N/A;  . FRACTURE SURGERY Right    shoulder replacement  . HEMORRHOID BANDING    . ORIF DISTAL RADIUS FRACTURE Left   . UPPER GASTROINTESTINAL ENDOSCOPY      There were no vitals filed for this visit.   Subjective Assessment - 08/26/20 1804  Subjective Patient had a migraine yesterday. Has been having trouble sleeping/panic attacks since seen last.    Pertinent History Patient has been seen by this therapist in the past for back related injuries/pain. Was last seen by this author in December of 2019. Since discharge patient has had COVID and an upper GI due to stomach issues. PMH includes aorta type A dissection (2011) with aortic valve replacement, bypass of R Coronary artery, hypertension, hyperlipidemia, CHF, pre-diabetes, AFIB, anxiety, depression, arthritis, GERD, atypical migraine, asthma, allergic rhinitis. 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 Does now have help with aide.    Limitations Sitting;Reading;Lifting;Standing;Walking;Writing;Other (comment);House hold activities    How long can  you sit comfortably? 20 mins before whole body discomfort    How long can you stand comfortably? 5 mins  or less: lower back    How long can you walk comfortably? 2 minutes    Patient Stated Goals to decrease pain and migraines    Currently in Pain? Yes    Pain Score 3     Pain Location Head    Pain Descriptors / Indicators Headache    Pain Type Neuropathic pain    Pain Onset More than a month ago                   Treatment:      Prone:              CPAs and UPAs grade II-IV to thoracic and lumbar spine, 5x 10 seconds each level. Hypomobility throughout with reduction with repetition. T10 and L2 hypomobile              Inferior SI : center and lateral crest inferior mobilizations grade II-III pain relieving x2 minutes J mobilization to upper thoracic/lower cervical for reduced thoracic kyphosis. 3x30 seconds  bilateral UE shoulder extension/row 15x   quadruped: Child pose 30 seconds x2  Trials Cat cow 10x UE reach 10x each UE  Seated: BTB row 15x BTB straight arm lat pull down 10x  Trigger point/soft tissue release with movement to improve soft tissue limited muscle tissue length of upper trap, levator scap, cervical paraspinals.x 6 minutes  Standing against wall: Wall posture 5x 15 second holds Modified wall plank with LE raises/march 10x each LE   Pt educated throughout session about proper posture and technique with exercises. Improved exercise technique, movement                    PT Education - 08/26/20 1805    Education provided Yes    Education Details exercise technique, body mechanics    Person(s) Educated Patient    Methods Explanation;Demonstration;Tactile cues;Verbal cues    Comprehension Verbalized understanding;Returned demonstration;Verbal cues required;Tactile cues required            PT Short Term Goals - 08/08/20 1010      PT SHORT TERM GOAL #1   Title Patient will be independent in home exercise program to improve  strength/mobility and decrease pain for better functional independence with ADLs.    Baseline 5/26: HEP next session 7/14; HEP compliant    Time 2    Period Weeks    Status Achieved    Target Date 05/28/20      PT SHORT TERM GOAL #2   Title Patient will report a worst pain of 7/10 on VAS in head to improve tolerance with ADLs and reduced symptoms with activities.    Baseline 5/26: 9/10 7/14: 7/10  Time 2    Period Weeks    Status Achieved    Target Date 05/28/20      PT SHORT TERM GOAL #3   Title Patient will report a worst pain of 6/10 on VAS in thoracic and lumbar spine to improve tolerance with ADLs and reduced symptoms with activities.    Baseline 6/29: 8/10 pain 7/14: 8/10 8/20: thoracic 4/10 lumbar 8/10    Time 2    Period Weeks    Status Partially Met    Target Date 07/23/20             PT Long Term Goals - 08/08/20 1011      PT LONG TERM GOAL #1   Title Patient will report a worst pain of 3/10 on VAS in her head/migraines to improve tolerance with ADLs and reduced symptoms with activities    Baseline 5/26: 9/10 7/14: 7/10 8/20: 7/10    Time 8    Period Weeks    Status Partially Met    Target Date 09/02/20      PT LONG TERM GOAL #2   Title Patient will increase FOTO score to equal to or greater than   45/100  to demonstrate statistically significant improvement in mobility and quality of life.    Baseline 5/26: 44 with risk adjusted 33/100 7/14: 38.7% 8/20: 43%    Time 8    Period Weeks    Status Partially Met    Target Date 09/02/20      PT LONG TERM GOAL #3   Title Patient will reduce dizziness handicap inventory score to <30, for less dizziness with ADLs and increased safety with home and work tasks.    Baseline 5/26: 40 7/14: 34% 8/20: 32%    Time 8    Period Weeks    Status Partially Met    Target Date 09/02/20      PT LONG TERM GOAL #4   Title Patient will improve cervical rotation ROM to >/=60 degrees bilaterally to improve functional mobility  and demonstrate improved muscle tissue length    Baseline 5/26: R 46 L 42 7/14: assess next session 7/21: R 51 L 49 8/20: L 60 R 51    Time 8    Period Weeks    Status Partially Met    Target Date 09/02/20      PT LONG TERM GOAL #5   Title Patient will report a worst pain of 3/10 on VAS in  thoracic and lumbar spine    to improve tolerance with ADLs and reduced symptoms with activities and caregiver duties    Baseline 6/29: 8/10 7/13: 8/10 8/20: thoracic 4/10 lumbar 8/10    Time 8    Period Weeks    Status Partially Met    Target Date 09/02/20      PT LONG TERM GOAL #6   Title Patient will tolerate sitting unsupported demonstrating erect sitting posture with minimal thoracic kyphosis for 15+ minutes with maximum of 5/10 back pain to demonstrate improved back extensor strength and improved sitting tolerance.    Baseline 6/29: painful and limited 7/14: can sit 15 minutes but has increasing forward head posture with prolonged hold 8/20: could drive 1 hour but had increase of pain to 8/10    Time 8    Period Weeks    Status Partially Met    Target Date 09/02/20      PT LONG TERM GOAL #7   Title Patient will reduce modified Oswestry score  to <20 as to demonstrate minimal disability with ADLs including improved sleeping tolerance, walking/sitting tolerance etc for better mobility with ADLs.    Baseline 6/29: 46% 7/13: 49% 8/20: 56%    Time 8    Period Weeks    Status Partially Met    Target Date 09/02/20                 Plan - 08/26/20 1809    Clinical Impression Statement Patient presents with high muscle tension from recent stressors in life. Reduction of tissue tension required manual and therex for postural correction and spasm reduction. Patient reports decreased pain and tension by end of session. Patient will continue to benefit from skilled physical therapy to reduce pain, improve spinal mobility, and allow patient better quality of life.    Personal Factors and  Comorbidities Age;Comorbidity 3+;Fitness;Finances;Past/Current Experience;Social Background;Time since onset of injury/illness/exacerbation;Other    Comorbidities aorta type A dissection (2011) with aortic valve replacement, bypass of R Coronary artery, hypertension, hyperlipidemia, CHF, pre-diabetes, AFIB, anxiety, depression, arthritis, GERD, atypical migraine, asthma, allergic rhinitis.    Examination-Activity Limitations [place];Caring for Others;Carry;Dressing;Stairs;Squat;Sit;Reach Overhead;Locomotion Level;Lift;Stand;Toileting;Transfers    Examination-Participation Restrictions Church;Cleaning;Community Activity;Interpersonal Relationship;Driving;Laundry;Volunteer;Shop;Personal Finances;Meal Prep;Medication Management;Yard Work    Product manager    Rehab Potential Fair    Clinical Impairments Affecting Rehab Potential + CAD testing, history of AAA, anemia, anxiety, asthma, CAD, chronic combined ystolic heart failure, GERD, Marfan syndrome, OA, RA (+) good work Psychologist, forensic, motivated, age     PT Frequency 2x / week    PT Duration 8 weeks    PT Treatment/Interventions ADLs/Self Care Home Management;Aquatic Therapy;Vestibular;Vasopneumatic Device;Taping;Passive range of motion;Dry needling;Energy conservation;Manual techniques;Therapeutic exercise;Balance training;Neuromuscular re-education;Patient/family education;Therapeutic activities;Functional mobility training;Stair training;Gait training;Electrical Stimulation;Iontophoresis 22m/ml Dexamethasone;Moist Heat;Traction;Ultrasound;Cryotherapy;Canalith Repostioning;Joint Manipulations;Spinal Manipulations    PT Next Visit Plan back interventions; posture    PT Home Exercise Plan Educated in seated Lev Scap and U/T stretches for HEP, as well as scap pinches and chin tucks.    Consulted and Agree with Plan of Care Patient           Patient will benefit from skilled therapeutic intervention in order to  improve the following deficits and impairments:  Cardiopulmonary status limiting activity, Decreased activity tolerance, Decreased endurance, Decreased mobility, Decreased range of motion, Decreased strength, Difficulty walking, Dizziness, Hypomobility, Impaired flexibility, Impaired perceived functional ability, Increased muscle spasms, Impaired UE functional use, Impaired vision/preception, Obesity, Postural dysfunction, Improper body mechanics, Pain, Abnormal gait  Visit Diagnosis: Cervicalgia  Chronic low back pain, unspecified back pain laterality, unspecified whether sciatica present  Abnormal posture  Pain in thoracic spine     Problem List Patient Active Problem List   Diagnosis Date Noted  . Acute bronchitis with bronchospasm 05/28/2020  . Diarrhea 04/11/2020  . Epigastric pain 02/19/2020  . Dark stools 02/19/2020  . PAF (paroxysmal atrial fibrillation) ([place] 02/05/2019  . Shortness of breath 01/27/2019  . Irregular surface of cornea 12/25/2018  . HPV (human papilloma virus) infection 12/01/2016  . Screening mammogram, encounter for 11/29/2016  . Estrogen deficiency 11/29/2016  . Encounter for routine gynecological examination 11/29/2016  . Grade III hemorrhoids 10/06/2016  . Tachycardia 08/14/2016  . Hemorrhoids 08/13/2016  . Atypical migraine 08/03/2016  . CAD (coronary artery disease)   . AAA ([place]dominal aortic aneurysm) ([place]   . Chronic combined systolic (congestive) and diastolic (congestive) heart failure ([place]   . Prediabetes 02/11/2016  . Generalized anxiety disorder 12/08/2015  . Panic attacks 07/28/2015  . Sensorineural hearing loss 03/27/2013  .  Vitamin D deficiency 07/03/2012  . H/O aortic dissection 05/12/2012  . Class 2 obesity due to excess calories with body mass index (BMI) of 35.0 to 35.9 in adult 11/21/2011  . Obesity (BMI 30.0-34.9) 11/21/2011  . Routine general medical examination at a health care facility 09/20/2011  . S/P AVR (aortic valve  replacement) 01/07/2011  . CORONARY ARTERY BYPASS GRAFT, HX OF 01/07/2011  . Arthritis 12/20/2010  . Back pain 12/15/2010  . Dissection of thoracic aorta ([place]) 12/11/2010  . H/O dissecting abdominal aortic aneurysm repair 08/24/2010  . IRRITABLE BOWEL SYNDROME 09/09/2009  . Obstructive sleep apnea 08/04/2009  . External hemorrhoid 06/26/2009  . Osteoporosis 01/09/2009  . Hyperlipidemia 07/26/2007  . Depression with anxiety 07/26/2007  . Essential hypertension 07/26/2007  . Allergic rhinitis 07/26/2007  . Asthma 07/26/2007  . GERD 07/26/2007  . Osteoarthritis 07/26/2007  . Ocular migraine 07/26/2007  . Mild intermittent asthma without complication 16/09/9603   Janna Arch, PT, DPT   08/26/2020, 6:10 PM  [place] MAIN [facility] SERVICES [address] [place], [place], [address] Phone: [phone]   Fax:  [phone]  Name: ASHLEYNICOLE MCCLEES MRN: [id number] Date of Birth: 1956/12/13

## 2020-08-27 ENCOUNTER — Ambulatory Visit (INDEPENDENT_AMBULATORY_CARE_PROVIDER_SITE_OTHER): Payer: Medicare HMO | Admitting: Psychology

## 2020-08-27 DIAGNOSIS — F4323 Adjustment disorder with mixed anxiety and depressed mood: Secondary | ICD-10-CM | POA: Diagnosis not present

## 2020-09-02 ENCOUNTER — Ambulatory Visit (INDEPENDENT_AMBULATORY_CARE_PROVIDER_SITE_OTHER): Payer: Medicare HMO | Admitting: Psychology

## 2020-09-02 ENCOUNTER — Other Ambulatory Visit: Payer: Self-pay

## 2020-09-02 ENCOUNTER — Ambulatory Visit: Payer: Medicare HMO

## 2020-09-02 DIAGNOSIS — G8929 Other chronic pain: Secondary | ICD-10-CM | POA: Diagnosis not present

## 2020-09-02 DIAGNOSIS — F4323 Adjustment disorder with mixed anxiety and depressed mood: Secondary | ICD-10-CM | POA: Diagnosis not present

## 2020-09-02 DIAGNOSIS — M545 Low back pain, unspecified: Secondary | ICD-10-CM

## 2020-09-02 DIAGNOSIS — R293 Abnormal posture: Secondary | ICD-10-CM

## 2020-09-02 DIAGNOSIS — M542 Cervicalgia: Secondary | ICD-10-CM | POA: Diagnosis not present

## 2020-09-02 DIAGNOSIS — M546 Pain in thoracic spine: Secondary | ICD-10-CM | POA: Diagnosis not present

## 2020-09-02 NOTE — Therapy (Signed)
[place] MAIN [facility] SERVICES [address] [place], [place], [address] Phone: [phone]   Fax:  [phone]  Physical Therapy Treatment/RECERT  Patient Details  Name: Rachel Torres MRN: [id number] Date of Birth: 08/12/1956 Referring Provider (PT): Glori Bickers, Wynelle Fanny MD   Encounter Date: 09/02/2020   PT End of Session - 09/02/20 1319    Visit Number 25    Number of Visits 41    Date for PT Re-Evaluation 10/28/20    Authorization Type 5/10 8/20    Authorization Time Period back pain added to POC on 06/17/20    PT Start Time 1329    PT Stop Time 1413    PT Time Calculation (min) 44 min    Activity Tolerance Patient tolerated treatment well    Behavior During Therapy [facility] for tasks assessed/performed           Past Medical History:  Diagnosis Date  . AAA (abdominal aortic aneurysm) ([place])    a. Chronic w/o evidence of aneurysmal dil on CTA 01/2016.  Marland Kitchen Alcohol abuse   . Allergy   . Anemia   . Anxiety   . CAD (coronary artery disease)    a. 08/2010 s/p CABG x 1 (VG->RCA) @ time of Ao dissection repair; b. 01/2016 Lexiscan MV: mid antsept/apical defect w/ ? peri-infarct ischemia-->likely attenuation-->Med Rx.  . Chewing difficulty   . Chronic combined systolic (congestive) and diastolic (congestive) heart failure ([place])    a. 2012 EF 30-35%; b. 04/2015 EF 40-45%; c. 01/2016 Echo: Ef 55-65%, nl AoV bioprosthesis, nl RV, nl PASP.  Marland Kitchen Constipation   . Depression   . Edema of both lower extremities   . Gallbladder sludge   . GERD (gastroesophageal reflux disease)   . Heart failure ([place])   . Heart valve problem   . History of Bicuspid Aortic Valve    a. 08/2010 s/p AVR @ time of Ao dissection repair; b. 01/2016 Echo: Ef 55-65%, nl AoV bioprosthesis, nl RV, nl PASP.  Marland Kitchen Hx of repair of dissecting thoracic aortic aneurysm, [place] type A    a. 08/2010 s/p repair with AVR and VG->RCA; b. 01/2016 CTA: stable appearance of Asc Thoracic Aortic graft.  Opacification of flase lumen of chronic abd Ao dissection w/ retrograde flow through lumbar arteries. No aneurysmal dil of flase lumen.  . Hyperlipidemia   . Hypertensive heart disease   . IBS (irritable bowel syndrome)   . Internal hemorrhoids   . Kidney problem   . Marfan syndrome    pt denies  . Migraine   . Obstructive sleep apnea   . Osteoarthritis   . Osteoporosis   . Palpitations   . Pneumonia   . RA (rheumatoid arthritis) ([place])    a. Followed by Dr. [name]    Past Surgical History:  Procedure Laterality Date  . ABDOMINAL AORTIC ANEURYSM REPAIR    . CARDIAC CATHETERIZATION  2001  . [place]  . CHOLECYSTECTOMY    . COLONOSCOPY    . ESOPHAGOGASTRODUODENOSCOPY (EGD) WITH PROPOFOL N/A 05/08/2020   Procedure: ESOPHAGOGASTRODUODENOSCOPY (EGD) WITH PROPOFOL;  Surgeon: Virgel Manifold, MD;  Location: [facility] ENDOSCOPY;  Service: Endoscopy;  Laterality: N/A;  . FRACTURE SURGERY Right    shoulder replacement  . HEMORRHOID BANDING    . ORIF DISTAL RADIUS FRACTURE Left   . UPPER GASTROINTESTINAL ENDOSCOPY      There were no vitals filed for this visit.   Subjective Assessment - 09/02/20 1505  Subjective Patient reports she has had an increase in stress and has started having anxiety attacks and migraines from stress more so than tightness.    Pertinent History Patient has been seen by this therapist in the past for back related injuries/pain. Was last seen by this author in December of 2019. Since discharge patient has had COVID and an upper GI due to stomach issues. PMH includes aorta type A dissection (2011) with aortic valve replacement, bypass of R Coronary artery, hypertension, hyperlipidemia, CHF, pre-diabetes, AFIB, anxiety, depression, arthritis, GERD, atypical migraine, asthma, allergic rhinitis. 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 Does now have help with aide.    Limitations Sitting;Reading;Lifting;Standing;Walking;Writing;Other  (comment);House hold activities    How long can you sit comfortably? 20 mins before whole body discomfort    How long can you stand comfortably? 5 mins  or less: lower back    How long can you walk comfortably? 2 minutes    Patient Stated Goals to decrease pain and migraines    Currently in Pain? Yes    Pain Score 4     Pain Location Back    Pain Orientation Upper;Mid;Lower    Pain Descriptors / Indicators Aching    Pain Onset More than a month ago               RECERT Goals: VAS: worst: cervical 6/10, thoracic 7/10, low back 8/10 FOTO: 42 DHI: 50%  Cervical rotation : L WFL >68, R 64  Sitting 15 minutes: able to do  MODI: 56%    Treatment:   Prone:  CPAs and UPAs grade II-IV to thoracic and lumbar spine,6x 10 seconds each level. Hypomobility throughout with reduction with repetition. T10 and L2 hypomobile Inferior SI : center and lateral crest inferior mobilizations grade II-III pain relieving x2 minutes J mobilization to upper thoracic/lower cervical for reduced thoracic kyphosis. 3x30 seconds bilateral UE shoulder extension/row 15x   Seated: Trigger point/soft tissue release with movement to improve soft tissue limited muscle tissue lengthof upper trap, levator scap, cervical paraspinals.[id number]    Pt educated throughout session about proper posture and technique with exercises. Improved exercise technique, movement                        PT Education - 09/02/20 1317    Education provided Yes    Education Details exercise technique, body mechanics    Person(s) Educated Patient    Methods Explanation;Demonstration;Tactile cues;Verbal cues    Comprehension Verbalized understanding;Returned demonstration;Verbal cues required;Tactile cues required            PT Short Term Goals - 09/02/20 1321      PT SHORT TERM GOAL #1   Title Patient will be independent in home exercise program to improve  strength/mobility and decrease pain for better functional independence with ADLs.    Baseline 5/26: HEP next session 7/14; HEP compliant    Time 2    Period Weeks    Status Achieved    Target Date 05/28/20      PT SHORT TERM GOAL #2   Title Patient will report a worst pain of 7/10 on VAS in head to improve tolerance with ADLs and reduced symptoms with activities.    Baseline 5/26: 9/10 7/14: 7/10 9/14: 6/10    Time 2    Period Weeks    Status Achieved    Target Date 05/28/20      PT SHORT TERM GOAL #3   Title  Patient will report a worst pain of 6/10 on VAS in thoracic and lumbar spine to improve tolerance with ADLs and reduced symptoms with activities.    Baseline 6/29: 8/10 pain 7/14: 8/10 8/20: thoracic 4/10 lumbar 8/10 9/14: thoracic 7/10 low back 8/10    Time 2    Period Weeks    Status Partially Met    Target Date 09/16/20             PT Long Term Goals - 09/02/20 1344      PT LONG TERM GOAL #1   Title Patient will report a worst pain of 3/10 on VAS in her head/migraines to improve tolerance with ADLs and reduced symptoms with activities    Baseline 5/26: 9/10 7/14: 7/10 8/20: 7/10 9/14: 6/10    Time 8    Period Weeks    Status Partially Met    Target Date 10/28/20      PT LONG TERM GOAL #2   Title Patient will increase FOTO score to equal to or greater than   45/100  to demonstrate statistically significant improvement in mobility and quality of life.    Baseline 5/26: 44 with risk adjusted 33/100 7/14: 38.7% 8/20: 43% 9/14: 42%    Time 8    Period Weeks    Status Partially Met    Target Date 10/28/20      PT LONG TERM GOAL #3   Title Patient will reduce dizziness handicap inventory score to <30, for less dizziness with ADLs and increased safety with home and work tasks.    Baseline 5/26: 40 7/14: 34% 8/20: 32% 9/14: 40%    Time 8    Period Weeks    Status Partially Met    Target Date 10/28/20      PT LONG TERM GOAL #4   Title Patient will improve cervical  rotation ROM to >/=60 degrees bilaterally to improve functional mobility and demonstrate improved muscle tissue length    Baseline 5/26: R 46 L 42 7/14: assess next session 7/21: R 51 L 49 8/20: L 60 R 51 9/14: WFL bilaterally    Time 8    Period Weeks    Status Achieved      PT LONG TERM GOAL #5   Title Patient will report a worst pain of 3/10 on VAS in  thoracic and lumbar spine    to improve tolerance with ADLs and reduced symptoms with activities and caregiver duties    Baseline 6/29: 8/10 7/13: 8/10 8/20: thoracic 4/10 lumbar 8/10 9/14: thoracic 4/10 lumbar 8/10    Time 8    Period Weeks    Status Partially Met    Target Date 10/28/20      PT LONG TERM GOAL #6   Title Patient will tolerate sitting unsupported demonstrating erect sitting posture with minimal thoracic kyphosis for 15+ minutes with maximum of 5/10 back pain to demonstrate improved back extensor strength and improved sitting tolerance.    Baseline 6/29: painful and limited 7/14: can sit 15 minutes but has increasing forward head posture with prolonged hold 8/20: could drive 1 hour but had increase of pain to 8/10 9/14: able to perform with slight pain increase    Time 8    Period Weeks    Status Achieved      PT LONG TERM GOAL #7   Title Patient will reduce modified Oswestry score to <20 as to demonstrate minimal disability with ADLs including improved sleeping tolerance, walking/sitting tolerance etc for  better mobility with ADLs.    Baseline 6/29: 46% 7/13: 49% 8/20: 56% 9/14: 56%    Time 8    Period Weeks    Status Partially Met    Target Date 10/28/20                 Plan - 09/02/20 1424    Clinical Impression Statement Patient having increased caregiver demand resulting in new diagnosis of panic attacks. Due to recent increase in demand on patient will attempt a trial period for progression. DHI increase primarily from stress increase. Achieved neck ROM goal and seated tolerance goal indicating improved  functional mobility and carryover to daily life.  Patient will continue to benefit from skilled physical therapy to reduce pain, improve spinal mobility, and allow patient better quality of life.    Personal Factors and Comorbidities Age;Comorbidity 3+;Fitness;Finances;Past/Current Experience;Social Background;Time since onset of injury/illness/exacerbation;Other    Comorbidities aorta type A dissection (2011) with aortic valve replacement, bypass of R Coronary artery, hypertension, hyperlipidemia, CHF, pre-diabetes, AFIB, anxiety, depression, arthritis, GERD, atypical migraine, asthma, allergic rhinitis.    Examination-Activity Limitations [place];Caring for Others;Carry;Dressing;Stairs;Squat;Sit;Reach Overhead;Locomotion Level;Lift;Stand;Toileting;Transfers    Examination-Participation Restrictions Church;Cleaning;Community Activity;Interpersonal Relationship;Driving;Laundry;Volunteer;Shop;Personal Finances;Meal Prep;Medication Management;Yard Work    Product manager    Rehab Potential Fair    Clinical Impairments Affecting Rehab Potential + CAD testing, history of AAA, anemia, anxiety, asthma, CAD, chronic combined ystolic heart failure, GERD, Marfan syndrome, OA, RA (+) good work Psychologist, forensic, motivated, age     PT Frequency 2x / week    PT Duration 8 weeks    PT Treatment/Interventions ADLs/Self Care Home Management;Aquatic Therapy;Vestibular;Vasopneumatic Device;Taping;Passive range of motion;Dry needling;Energy conservation;Manual techniques;Therapeutic exercise;Balance training;Neuromuscular re-education;Patient/family education;Therapeutic activities;Functional mobility training;Stair training;Gait training;Electrical Stimulation;Iontophoresis 25m/ml Dexamethasone;Moist Heat;Traction;Ultrasound;Cryotherapy;Canalith Repostioning;Joint Manipulations;Spinal Manipulations    PT Next Visit Plan back interventions; posture    PT Home Exercise Plan Educated in  seated Lev Scap and U/T stretches for HEP, as well as scap pinches and chin tucks.    Consulted and Agree with Plan of Care Patient           Patient will benefit from skilled therapeutic intervention in order to improve the following deficits and impairments:  Cardiopulmonary status limiting activity, Decreased activity tolerance, Decreased endurance, Decreased mobility, Decreased range of motion, Decreased strength, Difficulty walking, Dizziness, Hypomobility, Impaired flexibility, Impaired perceived functional ability, Increased muscle spasms, Impaired UE functional use, Impaired vision/preception, Obesity, Postural dysfunction, Improper body mechanics, Pain, Abnormal gait  Visit Diagnosis: Cervicalgia  Chronic low back pain, unspecified back pain laterality, unspecified whether sciatica present  Abnormal posture  Pain in thoracic spine     Problem List Patient Active Problem List   Diagnosis Date Noted  . Acute bronchitis with bronchospasm 05/28/2020  . Diarrhea 04/11/2020  . Epigastric pain 02/19/2020  . Dark stools 02/19/2020  . PAF ([place]oxysmal atrial fibrillation) [place] 02/05/2019  . Shortness of breath 01/27/2019  . Irregular surface of cornea 12/25/2018  . HPV (human papilloma virus) infection 12/01/2016  . Screening mammogram, encounter for 11/29/2016  . Estrogen deficiency 11/29/2016  . Encounter for routine gynecological examination 11/29/2016  . Grade III hemorrhoids 10/06/2016  . Tachycardia 08/14/2016  . Hemorrhoids 08/13/2016  . Atypical migraine 08/03/2016  . CAD (coronary artery disease)   . [place]) [place]   . Chronic combined systolic (congestive) and diastolic (congestive) heart failure ([place]   . Prediabetes 02/11/2016  . Generalized anxiety disorder 12/08/2015  . Panic attacks 07/28/2015  . Sensorineural hearing loss  03/27/2013  . Vitamin D deficiency 07/03/2012  . H/O aortic dissection 05/12/2012  . Class 2 obesity due to  excess calories with body mass index (BMI) of 35.0 to 35.9 in adult 11/21/2011  . Obesity (BMI 30.0-34.9) 11/21/2011  . Routine general medical examination at a health care facility 09/20/2011  . S/P AVR (aortic valve replacement) 01/07/2011  . CORONARY ARTERY BYPASS GRAFT, HX OF 01/07/2011  . Arthritis 12/20/2010  . Back pain 12/15/2010  . Dissection of thoracic aorta ([place]) 12/11/2010  . H/O dissecting abdominal aortic aneurysm repair 08/24/2010  . IRRITABLE BOWEL SYNDROME 09/09/2009  . Obstructive sleep apnea 08/04/2009  . External hemorrhoid 06/26/2009  . Osteoporosis 01/09/2009  . Hyperlipidemia 07/26/2007  . Depression with anxiety 07/26/2007  . Essential hypertension 07/26/2007  . Allergic rhinitis 07/26/2007  . Asthma 07/26/2007  . GERD 07/26/2007  . Osteoarthritis 07/26/2007  . Ocular migraine 07/26/2007  . Mild intermittent asthma without complication 52/07/222   Janna Arch, PT, DPT   09/02/2020, 3:08 PM  [place] MAIN [facility] SERVICES [address] [place], [place], [address] Phone: [phone]   Fax:  [phone]  Name: Rachel Torres MRN: [id number] Date of Birth: Apr 08, 1956

## 2020-09-03 ENCOUNTER — Encounter (INDEPENDENT_AMBULATORY_CARE_PROVIDER_SITE_OTHER): Payer: Self-pay | Admitting: Family Medicine

## 2020-09-03 ENCOUNTER — Ambulatory Visit (INDEPENDENT_AMBULATORY_CARE_PROVIDER_SITE_OTHER): Payer: Medicare HMO | Admitting: Family Medicine

## 2020-09-03 ENCOUNTER — Other Ambulatory Visit: Payer: Self-pay

## 2020-09-03 VITALS — BP 102/68 | HR 78 | Temp 97.6°F | Ht 65.0 in | Wt 193.0 lb

## 2020-09-03 DIAGNOSIS — Z6832 Body mass index (BMI) 32.0-32.9, adult: Secondary | ICD-10-CM | POA: Diagnosis not present

## 2020-09-03 DIAGNOSIS — E669 Obesity, unspecified: Secondary | ICD-10-CM | POA: Diagnosis not present

## 2020-09-03 DIAGNOSIS — I10 Essential (primary) hypertension: Secondary | ICD-10-CM | POA: Diagnosis not present

## 2020-09-03 NOTE — Progress Notes (Signed)
Chief Complaint:   OBESITY Rachel Torres is here to discuss her progress with her obesity treatment plan along with follow-up of her obesity related diagnoses. Rachel Torres is on following a lower carbohydrate, vegetable and lean protein rich diet plan and states she is following her eating plan approximately 95% of the time. Rachel Torres states she is doing physical therapy for 5 minutes 7 times per week.  Today's visit was #: 6 Starting weight: 213 lbs Starting date: 05/05/2020 Today's weight: 193 lbs Today's date: 09/03/2020 Total lbs lost to date: 20 Total lbs lost since last in-office visit: 4  Interim History: Rachel Torres continues to do well with weight loss on her eating plan. She has a lot of stress with caring for her husband and her mother.  Subjective:   1. Essential hypertension Rachel Torres's blood pressure is very well controlled and she is on the lower end of normal. She denies feeling lightheaded or dizzy. She follows up with her Cardiologist next month.  Assessment/Plan:   1. Essential hypertension Rachel Torres will continue working on healthy weight loss, diet, and exercise to improve blood pressure control. Will watch for signs of hypotension as she continues her lifestyle modifications, and will continue to follow closely.  2. Class 1 obesity with serious comorbidity and body mass index (BMI) of 32.0 to 32.9 in adult, unspecified obesity type Rachel Torres is currently in the action stage of change. As such, her goal is to continue with weight loss efforts. She has agreed to following a lower carbohydrate, vegetable and lean protein rich diet plan.   Exercise goals: As is.  Behavioral modification strategies: increasing lean protein intake, increasing water intake and dealing with family or coworker sabotage.  Rachel Torres has agreed to follow-up with our clinic in 4 weeks. She was informed of the importance of frequent follow-up visits to maximize her success with intensive lifestyle modifications  for her multiple health conditions.   Objective:   Blood pressure 102/68, pulse 78, temperature 97.6 F (36.4 C), height 5\' 5"  (1.651 m), weight 193 lb (87.5 kg), SpO2 97 %. Body mass index is 32.12 kg/m.  General: Cooperative, alert, well developed, in no acute distress. HEENT: Conjunctivae and lids unremarkable. Cardiovascular: Regular rhythm.  Lungs: Normal work of breathing. Neurologic: No focal deficits.   Lab Results  Component Value Date   CREATININE 0.91 07/23/2020   BUN 18 07/23/2020   NA 139 07/23/2020   K 4.6 07/23/2020   CL 100 07/23/2020   CO2 25 07/23/2020   Lab Results  Component Value Date   ALT 20 07/23/2020   AST 22 07/23/2020   ALKPHOS 82 07/23/2020   BILITOT 0.3 07/23/2020   Lab Results  Component Value Date   HGBA1C 5.6 07/23/2020   HGBA1C 5.9 (H) 05/05/2020   HGBA1C 6.0 06/07/2019   HGBA1C 6.1 06/26/2018   HGBA1C 6.0 11/24/2017   Lab Results  Component Value Date   INSULIN 22.5 07/23/2020   INSULIN 25.2 (H) 05/05/2020   Lab Results  Component Value Date   TSH 1.040 05/05/2020   Lab Results  Component Value Date   CHOL 198 07/23/2020   HDL 52 07/23/2020   LDLCALC 122 (H) 07/23/2020   LDLDIRECT 189.4 10/13/2011   TRIG 136 07/23/2020   CHOLHDL 3 06/07/2019   Lab Results  Component Value Date   WBC 5.8 02/19/2020   HGB 13.0 02/19/2020   HCT 38.5 02/19/2020   MCV 95.4 02/19/2020   PLT 284.0 02/19/2020   Lab Results  Component Value  Date   FERRITIN 105.0 12/11/2010   Attestation Statements:   Reviewed by clinician on day of visit: allergies, medications, problem list, medical history, surgical history, family history, social history, and previous encounter notes.  Time spent on visit including pre-visit chart review and post-visit care and charting was 22 minutes.    I, Trixie Dredge, am acting as transcriptionist for [name], MD.  I have reviewed the above documentation for accuracy and completeness, and I agree with  the above. -  [name], MD

## 2020-09-05 ENCOUNTER — Other Ambulatory Visit: Payer: Self-pay

## 2020-09-05 ENCOUNTER — Ambulatory Visit: Payer: Medicare HMO

## 2020-09-05 DIAGNOSIS — M546 Pain in thoracic spine: Secondary | ICD-10-CM | POA: Diagnosis not present

## 2020-09-05 DIAGNOSIS — M545 Low back pain, unspecified: Secondary | ICD-10-CM

## 2020-09-05 DIAGNOSIS — G8929 Other chronic pain: Secondary | ICD-10-CM | POA: Diagnosis not present

## 2020-09-05 DIAGNOSIS — M542 Cervicalgia: Secondary | ICD-10-CM

## 2020-09-05 DIAGNOSIS — R293 Abnormal posture: Secondary | ICD-10-CM | POA: Diagnosis not present

## 2020-09-05 NOTE — Therapy (Signed)
[place] MAIN [facility] SERVICES [address] [place], [place], [address] Phone: [phone]   Fax:  [phone]  Physical Therapy Treatment  Patient Details  Name: Rachel Torres MRN: [id number] Date of Birth: 1956-01-23 Referring Provider (PT): Glori Bickers, Wynelle Fanny MD   Encounter Date: 09/05/2020   PT End of Session - 09/05/20 1046    Visit Number 26    Number of Visits 41    Date for PT Re-Evaluation 10/28/20    Authorization Type 6/10 8/20    Authorization Time Period back pain added to POC on 06/17/20    PT Start Time 1039    PT Stop Time 1128    PT Time Calculation (min) 49 min    Activity Tolerance Patient tolerated treatment well    Behavior During Therapy [facility] for tasks assessed/performed           Past Medical History:  Diagnosis Date  . AAA (abdominal aortic aneurysm) ([place])    a. Chronic w/o evidence of aneurysmal dil on CTA 01/2016.  Marland Kitchen Alcohol abuse   . Allergy   . Anemia   . Anxiety   . CAD (coronary artery disease)    a. 08/2010 s/p CABG x 1 (VG->RCA) @ time of Ao dissection repair; b. 01/2016 Lexiscan MV: mid antsept/apical defect w/ ? peri-infarct ischemia-->likely attenuation-->Med Rx.  . Chewing difficulty   . Chronic combined systolic (congestive) and diastolic (congestive) heart failure ([place])    a. 2012 EF 30-35%; b. 04/2015 EF 40-45%; c. 01/2016 Echo: Ef 55-65%, nl AoV bioprosthesis, nl RV, nl PASP.  Marland Kitchen Constipation   . Depression   . Edema of both lower extremities   . Gallbladder sludge   . GERD (gastroesophageal reflux disease)   . Heart failure ([place])   . Heart valve problem   . History of Bicuspid Aortic Valve    a. 08/2010 s/p AVR @ time of Ao dissection repair; b. 01/2016 Echo: Ef 55-65%, nl AoV bioprosthesis, nl RV, nl PASP.  Marland Kitchen Hx of repair of dissecting thoracic aortic aneurysm, [place] type A    a. 08/2010 s/p repair with AVR and VG->RCA; b. 01/2016 CTA: stable appearance of Asc Thoracic Aortic graft.  Opacification of flase lumen of chronic abd Ao dissection w/ retrograde flow through lumbar arteries. No aneurysmal dil of flase lumen.  . Hyperlipidemia   . Hypertensive heart disease   . IBS (irritable bowel syndrome)   . Internal hemorrhoids   . Kidney problem   . Marfan syndrome    pt denies  . Migraine   . Obstructive sleep apnea   . Osteoarthritis   . Osteoporosis   . Palpitations   . Pneumonia   . RA (rheumatoid arthritis) ([place])    a. Followed by Dr. [name]    Past Surgical History:  Procedure Laterality Date  . ABDOMINAL AORTIC ANEURYSM REPAIR    . CARDIAC CATHETERIZATION  2001  . [employer]  . CHOLECYSTECTOMY    . COLONOSCOPY    . ESOPHAGOGASTRODUODENOSCOPY (EGD) WITH PROPOFOL N/A 05/08/2020   Procedure: ESOPHAGOGASTRODUODENOSCOPY (EGD) WITH PROPOFOL;  Surgeon: Virgel Manifold, MD;  Location: [facility] ENDOSCOPY;  Service: Endoscopy;  Laterality: N/A;  . FRACTURE SURGERY Right    shoulder replacement  . HEMORRHOID BANDING    . ORIF DISTAL RADIUS FRACTURE Left   . UPPER GASTROINTESTINAL ENDOSCOPY      There were no vitals filed for this visit.   Subjective Assessment - 09/05/20 1044  Subjective Patient reports she spend the day at the [place] [facility] last night all night for her husband. Is exhausted and feeling overwhelmed.    Pertinent History Patient has been seen by this therapist in the past for back related injuries/pain. Was last seen by this author in December of 2019. Since discharge patient has had COVID and an upper GI due to stomach issues. PMH includes aorta type A dissection (2011) with aortic valve replacement, bypass of R Coronary artery, hypertension, hyperlipidemia, CHF, pre-diabetes, AFIB, anxiety, depression, arthritis, GERD, atypical migraine, asthma, allergic rhinitis. 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 Does now have help with aide.    Limitations Sitting;Reading;Lifting;Standing;Walking;Writing;Other (comment);House hold  activities    How long can you sit comfortably? 20 mins before whole body discomfort    How long can you stand comfortably? 5 mins  or less: lower back    How long can you walk comfortably? 2 minutes    Patient Stated Goals to decrease pain and migraines    Currently in Pain? Yes    Pain Score 4     Pain Location Back    Pain Orientation Upper;Mid;Lower    Pain Descriptors / Indicators Aching    Pain Type Neuropathic pain;Chronic pain    Pain Onset More than a month ago    Pain Frequency Intermittent               Treatment:   Prone:  CPAs and UPAs grade II-IV to thoracic and lumbar spine,5x 10 seconds each level. Hypomobility throughout with reduction with repetition. T10 and L2 hypomobile Inferior SI : center and lateral crest inferior mobilizations grade II-III pain relieving x2 minutes J mobilization to upper thoracic/lower cervical for reduced thoracic kyphosis. 3x30 seconds bilateral UE shoulder extension/row 15x Hip flexor lengthening : result in spasm of hamstring 2x 30 seconds each LE   Supine:  Cervical side bend 2x 30 seconds with overpressure at occiput and glenohumeral joint for optimal muscle tissue lengthening Cervical rotation. 2x 30 seconds with overpressure at occiput and glenohumeral joint for optimal muscle tissue lengthening Suboccipital release 3x30 second holds  BTB overhead row with core contraction 10x Posterior pelvic tilt BTB abduction 15x Posterior pelvic tilt adduction ball squeeze 15x 3 second holds   Seated: BTB row 15x BTB straight arm lat pull down 10x  Trigger point/soft tissue release with movement to improve soft tissue limited muscle tissue lengthof upper trap, levator scap, cervical paraspinals.[id number]  Standing against wall: Wall posture 5x 15 second holds Modified wall plank with LE raises/march 10x each LE  Pt educated throughout session about proper posture and technique with exercises.  Improved exercise technique, movement                        PT Education - 09/05/20 1045    Education provided Yes    Education Details exerice technique, manual, body mechanics    Person(s) Educated Patient    Methods Explanation;Tactile cues;Demonstration;Verbal cues    Comprehension Verbalized understanding;Returned demonstration;Verbal cues required;Tactile cues required            PT Short Term Goals - 09/02/20 1321      PT SHORT TERM GOAL #1   Title Patient will be independent in home exercise program to improve strength/mobility and decrease pain for better functional independence with ADLs.    Baseline 5/26: HEP next session 7/14; HEP compliant    Time 2    Period Weeks  Status Achieved    Target Date 05/28/20      PT SHORT TERM GOAL #2   Title Patient will report a worst pain of 7/10 on VAS in head to improve tolerance with ADLs and reduced symptoms with activities.    Baseline 5/26: 9/10 7/14: 7/10 9/14: 6/10    Time 2    Period Weeks    Status Achieved    Target Date 05/28/20      PT SHORT TERM GOAL #3   Title Patient will report a worst pain of 6/10 on VAS in thoracic and lumbar spine to improve tolerance with ADLs and reduced symptoms with activities.    Baseline 6/29: 8/10 pain 7/14: 8/10 8/20: thoracic 4/10 lumbar 8/10 9/14: thoracic 7/10 low back 8/10    Time 2    Period Weeks    Status Partially Met    Target Date 09/16/20             PT Long Term Goals - 09/02/20 1344      PT LONG TERM GOAL #1   Title Patient will report a worst pain of 3/10 on VAS in her head/migraines to improve tolerance with ADLs and reduced symptoms with activities    Baseline 5/26: 9/10 7/14: 7/10 8/20: 7/10 9/14: 6/10    Time 8    Period Weeks    Status Partially Met    Target Date 10/28/20      PT LONG TERM GOAL #2   Title Patient will increase FOTO score to equal to or greater than   45/100  to demonstrate statistically significant improvement  in mobility and quality of life.    Baseline 5/26: 44 with risk adjusted 33/100 7/14: 38.7% 8/20: 43% 9/14: 42%    Time 8    Period Weeks    Status Partially Met    Target Date 10/28/20      PT LONG TERM GOAL #3   Title Patient will reduce dizziness handicap inventory score to <30, for less dizziness with ADLs and increased safety with home and work tasks.    Baseline 5/26: 40 7/14: 34% 8/20: 32% 9/14: 40%    Time 8    Period Weeks    Status Partially Met    Target Date 10/28/20      PT LONG TERM GOAL #4   Title Patient will improve cervical rotation ROM to >/=60 degrees bilaterally to improve functional mobility and demonstrate improved muscle tissue length    Baseline 5/26: R 46 L 42 7/14: assess next session 7/21: R 51 L 49 8/20: L 60 R 51 9/14: WFL bilaterally    Time 8    Period Weeks    Status Achieved      PT LONG TERM GOAL #5   Title Patient will report a worst pain of 3/10 on VAS in  thoracic and lumbar spine    to improve tolerance with ADLs and reduced symptoms with activities and caregiver duties    Baseline 6/29: 8/10 7/13: 8/10 8/20: thoracic 4/10 lumbar 8/10 9/14: thoracic 4/10 lumbar 8/10    Time 8    Period Weeks    Status Partially Met    Target Date 10/28/20      PT LONG TERM GOAL #6   Title Patient will tolerate sitting unsupported demonstrating erect sitting posture with minimal thoracic kyphosis for 15+ minutes with maximum of 5/10 back pain to demonstrate improved back extensor strength and improved sitting tolerance.    Baseline 6/29: painful  and limited 7/14: can sit 15 minutes but has increasing forward head posture with prolonged hold 8/20: could drive 1 hour but had increase of pain to 8/10 9/14: able to perform with slight pain increase    Time 8    Period Weeks    Status Achieved      PT LONG TERM GOAL #7   Title Patient will reduce modified Oswestry score to <20 as to demonstrate minimal disability with ADLs including improved sleeping tolerance,  walking/sitting tolerance etc for better mobility with ADLs.    Baseline 6/29: 46% 7/13: 49% 8/20: 56% 9/14: 56%    Time 8    Period Weeks    Status Partially Met    Target Date 10/28/20                 Plan - 09/05/20 1214    Clinical Impression Statement Patient's session today included additional education on breathing techniques for stress reduction, postural correction with tension, and body alignment for total body relaxation under stress. Patient reports relief of symptoms by end of session with additional improvement of bony alignment. Patient will continue to benefit from skilled physical therapy to reduce pain, improve spinal mobility, and allow patient better quality of life.    Personal Factors and Comorbidities Age;Comorbidity 3+;Fitness;Finances;Past/Current Experience;Social Background;Time since onset of injury/illness/exacerbation;Other    Comorbidities aorta type A dissection (2011) with aortic valve replacement, bypass of R Coronary artery, hypertension, hyperlipidemia, CHF, pre-diabetes, AFIB, anxiety, depression, arthritis, GERD, atypical migraine, asthma, allergic rhinitis.    Examination-Activity Limitations Bend;Caring for Others;Carry;Dressing;Stairs;Squat;Sit;Reach Overhead;Locomotion Level;Lift;Stand;Toileting;Transfers    Examination-Participation Restrictions Church;Cleaning;Community Activity;Interpersonal Relationship;Driving;Laundry;Volunteer;Shop;Personal Finances;Meal Prep;Medication Management;Yard Work    Product manager    Rehab Potential Fair    Clinical Impairments Affecting Rehab Potential + CAD testing, history of AAA, anemia, anxiety, asthma, CAD, chronic combined ystolic heart failure, GERD, Marfan syndrome, OA, RA (+) good work Psychologist, forensic, motivated, age     PT Frequency 2x / week    PT Duration 8 weeks    PT Treatment/Interventions ADLs/Self Care Home Management;Aquatic Therapy;Vestibular;Vasopneumatic  Device;Taping;Passive range of motion;Dry needling;Energy conservation;Manual techniques;Therapeutic exercise;Balance training;Neuromuscular re-education;Patient/family education;Therapeutic activities;Functional mobility training;Stair training;Gait training;Electrical Stimulation;Iontophoresis 58m/ml Dexamethasone;Moist Heat;Traction;Ultrasound;Cryotherapy;Canalith Repostioning;Joint Manipulations;Spinal Manipulations    PT Next Visit Plan back interventions; posture    PT Home Exercise Plan Educated in seated Lev Scap and U/T stretches for HEP, as well as scap pinches and chin tucks.    Consulted and Agree with Plan of Care Patient           Patient will benefit from skilled therapeutic intervention in order to improve the following deficits and impairments:  Cardiopulmonary status limiting activity, Decreased activity tolerance, Decreased endurance, Decreased mobility, Decreased range of motion, Decreased strength, Difficulty walking, Dizziness, Hypomobility, Impaired flexibility, Impaired perceived functional ability, Increased muscle spasms, Impaired UE functional use, Impaired vision/preception, Obesity, Postural dysfunction, Improper body mechanics, Pain, Abnormal gait  Visit Diagnosis: Cervicalgia  Chronic low back pain, unspecified back pain laterality, unspecified whether sciatica present  Abnormal posture  Pain in thoracic spine     Problem List Patient Active Problem List   Diagnosis Date Noted  . Acute bronchitis with bronchospasm 05/28/2020  . Diarrhea 04/11/2020  . Epigastric pain 02/19/2020  . Dark stools 02/19/2020  . PAF ([place]oxysmal [place] fibrillation) ([place] 02/05/2019  . Shortness of breath 01/27/2019  . Irregular surface of cornea 12/25/2018  . HPV (human papilloma virus) infection 12/01/2016  . Screening mammogram, encounter for 11/29/2016  . Estrogen deficiency  11/29/2016  . Encounter for routine gynecological examination 11/29/2016  . Grade III  hemorrhoids 10/06/2016  . Tachycardia 08/14/2016  . Hemorrhoids 08/13/2016  . Atypical migraine 08/03/2016  . CAD (coronary artery disease)   . AAA (abdominal aortic aneurysm) ([place])   . Chronic combined systolic (congestive) and diastolic (congestive) heart failure ([place])   . Prediabetes 02/11/2016  . Generalized anxiety disorder 12/08/2015  . Panic attacks 07/28/2015  . Sensorineural hearing loss 03/27/2013  . Vitamin D deficiency 07/03/2012  . H/O aortic dissection 05/12/2012  . Class 2 obesity due to excess calories with body mass index (BMI) of 35.0 to 35.9 in adult 11/21/2011  . Obesity (BMI 30.0-34.9) 11/21/2011  . Routine general medical examination at a health care facility 09/20/2011  . S/P AVR (aortic valve replacement) 01/07/2011  . CORONARY ARTERY BYPASS GRAFT, HX OF 01/07/2011  . Arthritis 12/20/2010  . Back pain 12/15/2010  . Dissection of thoracic aorta ([place]) 12/11/2010  . H/O dissecting abdominal aortic aneurysm repair 08/24/2010  . IRRITABLE BOWEL SYNDROME 09/09/2009  . Obstructive sleep apnea 08/04/2009  . External hemorrhoid 06/26/2009  . Osteoporosis 01/09/2009  . Hyperlipidemia 07/26/2007  . Depression with anxiety 07/26/2007  . Essential hypertension 07/26/2007  . Allergic rhinitis 07/26/2007  . Asthma 07/26/2007  . GERD 07/26/2007  . Osteoarthritis 07/26/2007  . Ocular migraine 07/26/2007  . Mild intermittent asthma without complication 82/07/1387   Janna Arch, PT, DPT   09/05/2020, 12:15 PM  [place] MAIN [facility] SERVICES [address] [place], [place], [address] Phone: [phone]   Fax:  [phone]  Name: Rachel Torres MRN: [id number] Date of Birth: 1956-04-15

## 2020-09-09 ENCOUNTER — Other Ambulatory Visit: Payer: Self-pay

## 2020-09-09 ENCOUNTER — Ambulatory Visit: Payer: Medicare HMO

## 2020-09-09 DIAGNOSIS — M545 Low back pain, unspecified: Secondary | ICD-10-CM

## 2020-09-09 DIAGNOSIS — R293 Abnormal posture: Secondary | ICD-10-CM

## 2020-09-09 DIAGNOSIS — G8929 Other chronic pain: Secondary | ICD-10-CM

## 2020-09-09 DIAGNOSIS — M546 Pain in thoracic spine: Secondary | ICD-10-CM

## 2020-09-09 DIAGNOSIS — M542 Cervicalgia: Secondary | ICD-10-CM

## 2020-09-09 NOTE — Therapy (Signed)
[place] [facility] [address] [place], [place], [address] Phone: [phone]   Fax:  [phone]  Physical Therapy Treatment  Patient Details  Name: Rachel Torres MRN: [id number] Date of Birth: [birth year] Referring Provider (PT): Glori Bickers, Wynelle Fanny MD   Encounter Date: 09/09/2020   PT End of Session - 09/09/20 1451    Visit Number 27    Number of Visits 41    Date for PT Re-Evaluation 10/28/20    Authorization Type 7/10 8/20    Authorization Time Period back pain added to POC on 06/17/20    PT Start Time 1345    PT Stop Time 1429    PT Time Calculation (min) 44 min    Activity Tolerance Patient tolerated treatment well    Behavior During Therapy [facility] for tasks assessed/performed           Past Medical History:  Diagnosis Date  . AAA (abdominal aortic aneurysm) ([place])    a. Chronic w/o evidence of aneurysmal dil on CTA 01/2016.  Marland Kitchen Alcohol abuse   . Allergy   . Anemia   . Anxiety   . CAD (coronary artery disease)    a. 08/2010 s/p CABG x 1 (VG->RCA) @ time of Ao dissection repair; b. 01/2016 Lexiscan MV: mid antsept/apical defect w/ ? peri-infarct ischemia-->likely attenuation-->Med Rx.  . Chewing difficulty   . Chronic combined systolic (congestive) and diastolic (congestive) heart failure ([place])    a. 2012 EF 30-35%; b. 04/2015 EF 40-45%; c. 01/2016 Echo: Ef 55-65%, nl AoV bioprosthesis, nl RV, nl PASP.  Marland Kitchen Constipation   . Depression   . Edema of both lower extremities   . Gallbladder sludge   . GERD (gastroesophageal reflux disease)   . Heart failure ([place])   . Heart valve problem   . History of Bicuspid Aortic Valve    a. 08/2010 s/p AVR @ time of Ao dissection repair; b. 01/2016 Echo: Ef 55-65%, nl AoV bioprosthesis, nl RV, nl PASP.  Marland Kitchen Hx of repair of dissecting thoracic aortic aneurysm, [place] type A    a. 08/2010 s/p repair with AVR and VG->RCA; b. 01/2016 CTA: stable appearance of Asc Thoracic Aortic graft.  Opacification of flase lumen of chronic abd Ao dissection w/ retrograde flow through lumbar arteries. No aneurysmal dil of flase lumen.  . Hyperlipidemia   . Hypertensive heart disease   . IBS (irritable bowel syndrome)   . Internal hemorrhoids   . Kidney problem   . Marfan syndrome    pt denies  . Migraine   . Obstructive sleep apnea   . Osteoarthritis   . Osteoporosis   . Palpitations   . Pneumonia   . RA (rheumatoid arthritis) ([place])    a. Followed by Dr. [name]    Past Surgical History:  Procedure Laterality Date  . ABDOMINAL AORTIC ANEURYSM REPAIR    . CARDIAC CATHETERIZATION  2001  . [place]  . CHOLECYSTECTOMY    . COLONOSCOPY    . ESOPHAGOGASTRODUODENOSCOPY (EGD) WITH PROPOFOL N/A 05/08/2020   Procedure: ESOPHAGOGASTRODUODENOSCOPY (EGD) WITH PROPOFOL;  Surgeon: Virgel Manifold, MD;  Location: [facility] ENDOSCOPY;  Service: Endoscopy;  Laterality: N/A;  . FRACTURE SURGERY Right    shoulder replacement  . HEMORRHOID BANDING    . ORIF DISTAL RADIUS FRACTURE Left   . UPPER GASTROINTESTINAL ENDOSCOPY      There were no vitals filed for this visit.   Subjective Assessment - 09/09/20 1346  Subjective Patient reports increased fatiue this session due to caregiver summit and household struggles    Pertinent History Patient has been seen by this therapist in the past for back related injuries/pain. Was last seen by this author in December of 2019. Since discharge patient has had COVID and an upper GI due to stomach issues. PMH includes aorta type A dissection (2011) with aortic valve replacement, bypass of R Coronary artery, hypertension, hyperlipidemia, CHF, pre-diabetes, AFIB, anxiety, depression, arthritis, GERD, atypical migraine, asthma, allergic rhinitis. 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 Does now have help with aide.    Limitations Sitting;Reading;Lifting;Standing;Walking;Writing;Other (comment);House hold activities    How long can you  sit comfortably? 20 mins before whole body discomfort    How long can you stand comfortably? 5 mins  or less: lower back    How long can you walk comfortably? 2 minutes    Patient Stated Goals to decrease pain and migraines    Currently in Pain? No/denies    Pain Onset More than a month ago                Treatment:      Prone:  CPAs and UPAs grade II-IV to thoracic and lumbar spine, 5x 10 seconds each level. Hypomobility throughout with reduction with repetition.   J mobilization to upper thoracic/lower cervical for reduced thoracic kyphosis. 3x30 seconds     Supine:  Cervical side bend 2x 30 seconds with overpressure at occiput and glenohumeral joint for optimal muscle tissue lengthening Cervical rotation. 2x 30 seconds with overpressure at occiput for maximal stretch Suboccipital release 3x30 second holds     Seated: BTB row 15x Ball roll out with green physioball. 10x with 5 second hold.  Trigger point/soft tissue release with movement to improve soft tissue limited muscle tissue length of upper trap, levator scap, cervical paraspinals.x 8 minutes   Standing At Wall: Serratus roll up with green physioball. SPT cues to sustain scapular retraction throughout exercise. 5x with 3 second hold    Pt educated throughout session about proper posture and technique with exercises. Improved exercise technique, movement                      PT Education - 09/09/20 1343    Education provided Yes    Education Details exercise technique, body mechanics    Person(s) Educated Patient    Methods Explanation;Demonstration;Tactile cues;Verbal cues    Comprehension Verbalized understanding;Returned demonstration;Verbal cues required;Tactile cues required            PT Short Term Goals - 09/02/20 1321      PT SHORT TERM GOAL #1   Title Patient will be independent in home exercise program to improve strength/mobility and decrease pain for better functional  independence with ADLs.    Baseline 5/26: HEP next session 7/14; HEP compliant    Time 2    Period Weeks    Status Achieved    Target Date 05/28/20      PT SHORT TERM GOAL #2   Title Patient will report a worst pain of 7/10 on VAS in head to improve tolerance with ADLs and reduced symptoms with activities.    Baseline 5/26: 9/10 7/14: 7/10 9/14: 6/10    Time 2    Period Weeks    Status Achieved    Target Date 05/28/20      PT SHORT TERM GOAL #3   Title Patient will report a worst pain of 6/10  on VAS in thoracic and lumbar spine to improve tolerance with ADLs and reduced symptoms with activities.    Baseline 6/29: 8/10 pain 7/14: 8/10 8/20: thoracic 4/10 lumbar 8/10 9/14: thoracic 7/10 low back 8/10    Time 2    Period Weeks    Status Partially Met    Target Date 09/16/20             PT Long Term Goals - 09/02/20 1344      PT LONG TERM GOAL #1   Title Patient will report a worst pain of 3/10 on VAS in her head/migraines to improve tolerance with ADLs and reduced symptoms with activities    Baseline 5/26: 9/10 7/14: 7/10 8/20: 7/10 9/14: 6/10    Time 8    Period Weeks    Status Partially Met    Target Date 10/28/20      PT LONG TERM GOAL #2   Title Patient will increase FOTO score to equal to or greater than   45/100  to demonstrate statistically significant improvement in mobility and quality of life.    Baseline 5/26: 44 with risk adjusted 33/100 7/14: 38.7% 8/20: 43% 9/14: 42%    Time 8    Period Weeks    Status Partially Met    Target Date 10/28/20      PT LONG TERM GOAL #3   Title Patient will reduce dizziness handicap inventory score to <30, for less dizziness with ADLs and increased safety with home and work tasks.    Baseline 5/26: 40 7/14: 34% 8/20: 32% 9/14: 40%    Time 8    Period Weeks    Status Partially Met    Target Date 10/28/20      PT LONG TERM GOAL #4   Title Patient will improve cervical rotation ROM to >/=60 degrees bilaterally to improve  functional mobility and demonstrate improved muscle tissue length    Baseline 5/26: R 46 L 42 7/14: assess next session 7/21: R 51 L 49 8/20: L 60 R 51 9/14: WFL bilaterally    Time 8    Period Weeks    Status Achieved      PT LONG TERM GOAL #5   Title Patient will report a worst pain of 3/10 on VAS in  thoracic and lumbar spine    to improve tolerance with ADLs and reduced symptoms with activities and caregiver duties    Baseline 6/29: 8/10 7/13: 8/10 8/20: thoracic 4/10 lumbar 8/10 9/14: thoracic 4/10 lumbar 8/10    Time 8    Period Weeks    Status Partially Met    Target Date 10/28/20      PT LONG TERM GOAL #6   Title Patient will tolerate sitting unsupported demonstrating erect sitting posture with minimal thoracic kyphosis for 15+ minutes with maximum of 5/10 back pain to demonstrate improved back extensor strength and improved sitting tolerance.    Baseline 6/29: painful and limited 7/14: can sit 15 minutes but has increasing forward head posture with prolonged hold 8/20: could drive 1 hour but had increase of pain to 8/10 9/14: able to perform with slight pain increase    Time 8    Period Weeks    Status Achieved      PT LONG TERM GOAL #7   Title Patient will reduce modified Oswestry score to <20 as to demonstrate minimal disability with ADLs including improved sleeping tolerance, walking/sitting tolerance etc for better mobility with ADLs.    Baseline  6/29: 46% 7/13: 49% 8/20: 56% 9/14: 56%    Time 8    Period Weeks    Status Partially Met    Target Date 10/28/20                 Plan - 09/09/20 1455    Clinical Impression Statement Patient demonstrates improvement in body alignment but continues to have muscular guarding and tension in paraspinals and neck musculature. Manual and therapeutic exercises did not elicit any pain. Patient demonstrates improved strength of postural musculature this session. Patient will continue to benefit from skilled physical therapy to  reduce pain, improve spinal mobility, and allow patient better quality of life.    Personal Factors and Comorbidities Age;Comorbidity 3+;Fitness;Finances;Past/Current Experience;Social Background;Time since onset of injury/illness/exacerbation;Other    Comorbidities aorta type A dissection (2011) with aortic valve replacement, bypass of R Coronary artery, hypertension, hyperlipidemia, CHF, pre-diabetes, AFIB, anxiety, depression, arthritis, GERD, atypical migraine, asthma, allergic rhinitis.    Examination-Activity Limitations Bend;Caring for Others;Carry;Dressing;Stairs;Squat;Sit;Reach Overhead;Locomotion Level;Lift;Stand;Toileting;Transfers    Examination-Participation Restrictions Church;Cleaning;Community Activity;Interpersonal Relationship;Driving;Laundry;Volunteer;Shop;Personal Finances;Meal Prep;Medication Management;Yard Work    Product manager    Rehab Potential Fair    Clinical Impairments Affecting Rehab Potential + CAD testing, history of AAA, anemia, anxiety, asthma, CAD, chronic combined ystolic heart failure, GERD, Marfan syndrome, OA, RA (+) good work Psychologist, forensic, motivated, age     PT Frequency 2x / week    PT Duration 8 weeks    PT Treatment/Interventions ADLs/Self Care Home Management;Aquatic Therapy;Vestibular;Vasopneumatic Device;Taping;Passive range of motion;Dry needling;Energy conservation;Manual techniques;Therapeutic exercise;Balance training;Neuromuscular re-education;Patient/family education;Therapeutic activities;Functional mobility training;Stair training;Gait training;Electrical Stimulation;Iontophoresis 78m/ml Dexamethasone;Moist Heat;Traction;Ultrasound;Cryotherapy;Canalith Repostioning;Joint Manipulations;Spinal Manipulations    PT Next Visit Plan back interventions; posture    PT Home Exercise Plan Educated in seated Lev Scap and U/T stretches for HEP, as well as scap pinches and chin tucks.    Consulted and Agree with Plan of  Care Patient           Patient will benefit from skilled therapeutic intervention in order to improve the following deficits and impairments:  Cardiopulmonary status limiting activity, Decreased activity tolerance, Decreased endurance, Decreased mobility, Decreased range of motion, Decreased strength, Difficulty walking, Dizziness, Hypomobility, Impaired flexibility, Impaired perceived functional ability, Increased muscle spasms, Impaired UE functional use, Impaired vision/preception, Obesity, Postural dysfunction, Improper body mechanics, Pain, Abnormal gait  Visit Diagnosis: Cervicalgia  Chronic low back pain, unspecified back pain laterality, unspecified whether sciatica present  Abnormal posture  Pain in thoracic spine     Problem List Patient Active Problem List   Diagnosis Date Noted  . Acute bronchitis with bronchospasm 05/28/2020  . Diarrhea 04/11/2020  . Epigastric pain 02/19/2020  . Dark stools 02/19/2020  . PA[place]mal [place] 02/05/2019  . Shortness of breath 01/27/2019  . Irregular surface of cornea 12/25/2018  . HPV (human papilloma virus) infection 12/01/2016  . Screening mammogram, encounter for 11/29/2016  . Estrogen deficiency 11/29/2016  . Encounter for routine gynecological examination 11/29/2016  . Grade III hemorrhoids 10/06/2016  . Tachycardia 08/14/2016  . Hemorrhoids 08/13/2016  . Atypical migraine 08/03/2016  . CAD (coronary artery disease)   . AAA ([place]dominal [place]) ([place]   . Chronic combined systolic (congestive) and diastolic (congestive) heart failure ([place]   . Prediabetes 02/11/2016  . Generalized anxiety disorder 12/08/2015  . Panic attacks 07/28/2015  . Sensorineural hearing loss 03/27/2013  . Vitamin D deficiency 07/03/2012  . H/O aortic dissection 05/12/2012  . Class 2 obesity due to excess calories  with body mass index (BMI) of 35.0 to 35.9 in adult 11/21/2011  . Obesity (BMI 30.0-34.9) 11/21/2011  .  Routine general medical examination at a health care facility 09/20/2011  . S/P AVR (aortic valve replacement) 01/07/2011  . CORONARY ARTERY BYPASS GRAFT, HX OF 01/07/2011  . Arthritis 12/20/2010  . Back pain 12/15/2010  . Dissection of thoracic aorta ([place]) 12/11/2010  . H/O dissecting abdominal aortic aneurysm repair 08/24/2010  . IRRITABLE BOWEL SYNDROME 09/09/2009  . Obstructive sleep apnea 08/04/2009  . External hemorrhoid 06/26/2009  . Osteoporosis 01/09/2009  . Hyperlipidemia 07/26/2007  . Depression with anxiety 07/26/2007  . Essential hypertension 07/26/2007  . Allergic rhinitis 07/26/2007  . Asthma 07/26/2007  . GERD 07/26/2007  . Osteoarthritis 07/26/2007  . Ocular migraine 07/26/2007  . Mild intermittent asthma without complication 14/70/9295   Tonny Bollman, SPT  This entire session was performed under direct supervision and direction of a licensed therapist/therapist assistant . I have personally read, edited and approve of the note as written.  Janna Arch, PT, DPT   09/09/2020, 2:56 PM  [name] [facility] [address] [place], [place], [address] Phone: [phone]   Fax:  [phone]  Name: Rachel Torres MRN: [id number] Date of Birth: 1956/02/05

## 2020-09-10 ENCOUNTER — Ambulatory Visit (INDEPENDENT_AMBULATORY_CARE_PROVIDER_SITE_OTHER): Payer: Medicare HMO | Admitting: Psychology

## 2020-09-10 DIAGNOSIS — F4323 Adjustment disorder with mixed anxiety and depressed mood: Secondary | ICD-10-CM

## 2020-09-12 ENCOUNTER — Ambulatory Visit: Payer: Medicare HMO

## 2020-09-16 ENCOUNTER — Ambulatory Visit: Payer: Medicare HMO

## 2020-09-16 ENCOUNTER — Other Ambulatory Visit: Payer: Self-pay

## 2020-09-16 ENCOUNTER — Ambulatory Visit (INDEPENDENT_AMBULATORY_CARE_PROVIDER_SITE_OTHER): Payer: Medicare HMO | Admitting: Psychology

## 2020-09-16 DIAGNOSIS — R293 Abnormal posture: Secondary | ICD-10-CM

## 2020-09-16 DIAGNOSIS — M545 Low back pain, unspecified: Secondary | ICD-10-CM

## 2020-09-16 DIAGNOSIS — G8929 Other chronic pain: Secondary | ICD-10-CM

## 2020-09-16 DIAGNOSIS — M546 Pain in thoracic spine: Secondary | ICD-10-CM

## 2020-09-16 DIAGNOSIS — F4323 Adjustment disorder with mixed anxiety and depressed mood: Secondary | ICD-10-CM | POA: Diagnosis not present

## 2020-09-16 DIAGNOSIS — M542 Cervicalgia: Secondary | ICD-10-CM | POA: Diagnosis not present

## 2020-09-16 NOTE — Therapy (Signed)
[place] MAIN [facility] SERVICES [address] [place], [place], [address] Phone: [phone]   Fax:  [phone]  Physical Therapy Treatment  Patient Details  Name: Rachel Torres MRN: [id number] Date of Birth: [birth year] Referring Provider (PT): Glori Bickers, Wynelle Fanny MD   Encounter Date: 09/16/2020   PT End of Session - 09/16/20 1353    Visit Number 28    Number of Visits 41    Date for PT Re-Evaluation 10/28/20    Authorization Type 8/10 8/20    Authorization Time Period back pain added to POC on 06/17/20    PT Start Time 1345    PT Stop Time 1429    PT Time Calculation (min) 44 min    Activity Tolerance Patient tolerated treatment well    Behavior During Therapy [facility] for tasks assessed/performed           Past Medical History:  Diagnosis Date  . AAA (abdominal aortic aneurysm) ([place])    a. Chronic w/o evidence of aneurysmal dil on CTA 01/2016.  Marland Kitchen Alcohol abuse   . Allergy   . Anemia   . Anxiety   . CAD (coronary artery disease)    a. 08/2010 s/p CABG x 1 (VG->RCA) @ time of Ao dissection repair; b. 01/2016 Lexiscan MV: mid antsept/apical defect w/ ? peri-infarct ischemia-->likely attenuation-->Med Rx.  . Chewing difficulty   . Chronic combined systolic (congestive) and diastolic (congestive) heart failure ([place])    a. 2012 EF 30-35%; b. 04/2015 EF 40-45%; c. 01/2016 Echo: Ef 55-65%, nl AoV bioprosthesis, nl RV, nl PASP.  Marland Kitchen Constipation   . Depression   . Edema of both lower extremities   . Gallbladder sludge   . GERD (gastroesophageal reflux disease)   . Heart failure ([place])   . Heart valve problem   . History of Bicuspid Aortic Valve    a. 08/2010 s/p AVR @ time of Ao dissection repair; b. 01/2016 Echo: Ef 55-65%, nl AoV bioprosthesis, nl RV, nl PASP.  Marland Kitchen Hx of repair of dissecting thoracic aortic aneurysm, [place] type A    a. 08/2010 s/p repair with AVR and VG->RCA; b. 01/2016 CTA: stable appearance of Asc Thoracic Aortic graft.  Opacification of flase lumen of chronic abd Ao dissection w/ retrograde flow through lumbar arteries. No aneurysmal dil of flase lumen.  . Hyperlipidemia   . Hypertensive heart disease   . IBS (irritable bowel syndrome)   . Internal hemorrhoids   . Kidney problem   . Marfan syndrome    pt denies  . Migraine   . Obstructive sleep apnea   . Osteoarthritis   . Osteoporosis   . Palpitations   . Pneumonia   . RA (rheumatoid arthritis) ([place])    a. Followed by Dr. [name]    Past Surgical History:  Procedure Laterality Date  . ABDOMINAL AORTIC ANEURYSM REPAIR    . CARDIAC CATHETERIZATION  2001  . [place]  . CHOLECYSTECTOMY    . COLONOSCOPY    . ESOPHAGOGASTRODUODENOSCOPY (EGD) WITH PROPOFOL N/A 05/08/2020   Procedure: ESOPHAGOGASTRODUODENOSCOPY (EGD) WITH PROPOFOL;  Surgeon: Virgel Manifold, MD;  Location: [facility] ENDOSCOPY;  Service: Endoscopy;  Laterality: N/A;  . FRACTURE SURGERY Right    shoulder replacement  . HEMORRHOID BANDING    . ORIF DISTAL RADIUS FRACTURE Left   . UPPER GASTROINTESTINAL ENDOSCOPY      There were no vitals filed for this visit.   Subjective Assessment - 09/16/20 1351  Subjective Patient reports no migraines since last session, has had some increased stiffness due to caregiver duties.    Pertinent History Patient has been seen by this therapist in the past for back related injuries/pain. Was last seen by this author in December of 2019. Since discharge patient has had COVID and an upper GI due to stomach issues. PMH includes aorta type A dissection (2011) with aortic valve replacement, bypass of R Coronary artery, hypertension, hyperlipidemia, CHF, pre-diabetes, AFIB, anxiety, depression, arthritis, GERD, atypical migraine, asthma, allergic rhinitis. 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 Does now have help with aide.    Limitations Sitting;Reading;Lifting;Standing;Walking;Writing;Other (comment);House hold activities    How  long can you sit comfortably? 20 mins before whole body discomfort    How long can you stand comfortably? 5 mins  or less: lower back    How long can you walk comfortably? 2 minutes    Patient Stated Goals to decrease pain and migraines    Currently in Pain? Yes    Pain Score 2     Pain Location Back    Pain Orientation Upper;Mid;Lower    Pain Descriptors / Indicators Aching    Pain Type Chronic pain;Neuropathic pain    Pain Onset More than a month ago               Treatment:      Prone:  CPAs and UPAs grade II-IV to thoracic and lumbar spine, 5x 12 seconds each level. Hypomobility throughout with reduction with repetition.    J mobilization to upper thoracic/lower cervical for reduced thoracic kyphosis. 3x30 seconds       Supine:  Cervical side bend 2x 30 seconds with overpressure at occiput and glenohumeral joint for optimal muscle tissue lengthening Cervical rotation. 2x 30 seconds with overpressure at occiput for maximal stretch Suboccipital release 3x30 second holds    On half foam roller: cues for body mechanics and sequencing  -robber stretch 3x20 second holds in varying planes  -bilateral UE ER RTB 12x -posterior pelvic tilts 15x    Seated: BTB row 15x Kneel to tall kneel 4x with cues for gluteal and core actions, regressed to tall kneel Ball roll out with green physioball. 10x with 5 second hold.    swiss ball TrA activation 10x 3 second holds between UE's and LE's  Swiss ball TrA activation with UE raise 10x 3 second holds.   Median nerve glides 15x    Trigger point/soft tissue release with movement to improve soft tissue limited muscle tissue length of upper trap, levator scap, cervical paraspinals.x 8 minutes      quadruped: UE reaches for hand taps 10x each side; very challenging   Pt educated throughout session about proper posture and technique with exercises. Improved exercise technique, movement                        PT  Education - 09/16/20 1352    Education provided Yes    Education Details exercise technique, body mechanics    Person(s) Educated Patient    Methods Explanation;Demonstration;Tactile cues;Verbal cues    Comprehension Verbalized understanding;Returned demonstration;Verbal cues required;Tactile cues required            PT Short Term Goals - 09/02/20 1321      PT SHORT TERM GOAL #1   Title Patient will be independent in home exercise program to improve strength/mobility and decrease pain for better functional independence with ADLs.    Baseline  5/26: HEP next session 7/14; HEP compliant    Time 2    Period Weeks    Status Achieved    Target Date 05/28/20      PT SHORT TERM GOAL #2   Title Patient will report a worst pain of 7/10 on VAS in head to improve tolerance with ADLs and reduced symptoms with activities.    Baseline 5/26: 9/10 7/14: 7/10 9/14: 6/10    Time 2    Period Weeks    Status Achieved    Target Date 05/28/20      PT SHORT TERM GOAL #3   Title Patient will report a worst pain of 6/10 on VAS in thoracic and lumbar spine to improve tolerance with ADLs and reduced symptoms with activities.    Baseline 6/29: 8/10 pain 7/14: 8/10 8/20: thoracic 4/10 lumbar 8/10 9/14: thoracic 7/10 low back 8/10    Time 2    Period Weeks    Status Partially Met    Target Date 09/16/20             PT Long Term Goals - 09/02/20 1344      PT LONG TERM GOAL #1   Title Patient will report a worst pain of 3/10 on VAS in her head/migraines to improve tolerance with ADLs and reduced symptoms with activities    Baseline 5/26: 9/10 7/14: 7/10 8/20: 7/10 9/14: 6/10    Time 8    Period Weeks    Status Partially Met    Target Date 10/28/20      PT LONG TERM GOAL #2   Title Patient will increase FOTO score to equal to or greater than   45/100  to demonstrate statistically significant improvement in mobility and quality of life.    Baseline 5/26: 44 with risk adjusted 33/100 7/14: 38.7%  8/20: 43% 9/14: 42%    Time 8    Period Weeks    Status Partially Met    Target Date 10/28/20      PT LONG TERM GOAL #3   Title Patient will reduce dizziness handicap inventory score to <30, for less dizziness with ADLs and increased safety with home and work tasks.    Baseline 5/26: 40 7/14: 34% 8/20: 32% 9/14: 40%    Time 8    Period Weeks    Status Partially Met    Target Date 10/28/20      PT LONG TERM GOAL #4   Title Patient will improve cervical rotation ROM to >/=60 degrees bilaterally to improve functional mobility and demonstrate improved muscle tissue length    Baseline 5/26: R 46 L 42 7/14: assess next session 7/21: R 51 L 49 8/20: L 60 R 51 9/14: WFL bilaterally    Time 8    Period Weeks    Status Achieved      PT LONG TERM GOAL #5   Title Patient will report a worst pain of 3/10 on VAS in  thoracic and lumbar spine    to improve tolerance with ADLs and reduced symptoms with activities and caregiver duties    Baseline 6/29: 8/10 7/13: 8/10 8/20: thoracic 4/10 lumbar 8/10 9/14: thoracic 4/10 lumbar 8/10    Time 8    Period Weeks    Status Partially Met    Target Date 10/28/20      PT LONG TERM GOAL #6   Title Patient will tolerate sitting unsupported demonstrating erect sitting posture with minimal thoracic kyphosis for 15+ minutes with maximum  of 5/10 back pain to demonstrate improved back extensor strength and improved sitting tolerance.    Baseline 6/29: painful and limited 7/14: can sit 15 minutes but has increasing forward head posture with prolonged hold 8/20: could drive 1 hour but had increase of pain to 8/10 9/14: able to perform with slight pain increase    Time 8    Period Weeks    Status Achieved      PT LONG TERM GOAL #7   Title Patient will reduce modified Oswestry score to <20 as to demonstrate minimal disability with ADLs including improved sleeping tolerance, walking/sitting tolerance etc for better mobility with ADLs.    Baseline 6/29: 46% 7/13: 49%  8/20: 56% 9/14: 56%    Time 8    Period Weeks    Status Partially Met    Target Date 10/28/20                 Plan - 09/16/20 1419    Clinical Impression Statement Patient's pain levels and neurological symptoms are reduced by end of session with patient reporting relief. Core activation and strengthening progressed. Manual and therapeutic exercises did not elicit any pain. Patient demonstrates improved strength of postural musculature this session. Patient will continue to benefit from skilled physical therapy to reduce pain, improve spinal mobility, and allow patient better quality of life.    Personal Factors and Comorbidities Age;Comorbidity 3+;Fitness;Finances;Past/Current Experience;Social Background;Time since onset of injury/illness/exacerbation;Other    Comorbidities aorta type A dissection (2011) with aortic valve replacement, bypass of R Coronary artery, hypertension, hyperlipidemia, CHF, pre-diabetes, AFIB, anxiety, depression, arthritis, GERD, atypical migraine, asthma, allergic rhinitis.    Examination-Activity Limitations Bend;Caring for Others;Carry;Dressing;Stairs;Squat;Sit;Reach Overhead;Locomotion Level;Lift;Stand;Toileting;Transfers    Examination-Participation Restrictions Church;Cleaning;Community Activity;Interpersonal Relationship;Driving;Laundry;Volunteer;Shop;Personal Finances;Meal Prep;Medication Management;Yard Work    Product manager    Rehab Potential Fair    Clinical Impairments Affecting Rehab Potential + CAD testing, history of AAA, anemia, anxiety, asthma, CAD, chronic combined ystolic heart failure, GERD, Marfan syndrome, OA, RA (+) good work Psychologist, forensic, motivated, age     PT Frequency 2x / week    PT Duration 8 weeks    PT Treatment/Interventions ADLs/Self Care Home Management;Aquatic Therapy;Vestibular;Vasopneumatic Device;Taping;Passive range of motion;Dry needling;Energy conservation;Manual  techniques;Therapeutic exercise;Balance training;Neuromuscular re-education;Patient/family education;Therapeutic activities;Functional mobility training;Stair training;Gait training;Electrical Stimulation;Iontophoresis 4mg /ml Dexamethasone;Moist Heat;Traction;Ultrasound;Cryotherapy;Canalith Repostioning;Joint Manipulations;Spinal Manipulations    PT Next Visit Plan back interventions; posture    PT Home Exercise Plan Educated in seated Lev Scap and U/T stretches for HEP, as well as scap pinches and chin tucks.    Consulted and Agree with Plan of Care Patient           Patient will benefit from skilled therapeutic intervention in order to improve the following deficits and impairments:  Cardiopulmonary status limiting activity, Decreased activity tolerance, Decreased endurance, Decreased mobility, Decreased range of motion, Decreased strength, Difficulty walking, Dizziness, Hypomobility, Impaired flexibility, Impaired perceived functional ability, Increased muscle spasms, Impaired UE functional use, Impaired vision/preception, Obesity, Postural dysfunction, Improper body mechanics, Pain, Abnormal gait  Visit Diagnosis: Cervicalgia  Chronic low back pain, unspecified back pain laterality, unspecified whether sciatica present  Abnormal posture  Pain in thoracic spine     Problem List Patient Active Problem List   Diagnosis Date Noted  . Acute bronchitis with bronchospasm 05/28/2020  . Diarrhea 04/11/2020  . Epigastric pain 02/19/2020  . Dark stools 02/19/2020  . PAF (paroxysmal atrial fibrillation) ([place]) 02/05/2019  . Shortness of breath 01/27/2019  . Irregular surface of cornea 12/25/2018  .  HPV (human papilloma virus) infection 12/01/2016  . Screening mammogram, encounter for 11/29/2016  . Estrogen deficiency 11/29/2016  . Encounter for routine gynecological examination 11/29/2016  . Grade III hemorrhoids 10/06/2016  . Tachycardia 08/14/2016  . Hemorrhoids 08/13/2016  .  Atypical migraine 08/03/2016  . CAD (coronary artery disease)   . AAA (abdominal aortic aneurysm) ([place])   . Chronic combined systolic (congestive) and diastolic (congestive) heart failure ([place])   . Prediabetes 02/11/2016  . Generalized anxiety disorder 12/08/2015  . Panic attacks 07/28/2015  . Sensorineural hearing loss 03/27/2013  . Vitamin D deficiency 07/03/2012  . H/O aortic dissection 05/12/2012  . Class 2 obesity due to excess calories with body mass index (BMI) of 35.0 to 35.9 in adult 11/21/2011  . Obesity (BMI 30.0-34.9) 11/21/2011  . Routine general medical examination at a health care facility 09/20/2011  . S/P AVR (aortic valve replacement) 01/07/2011  . CORONARY ARTERY BYPASS GRAFT, HX OF 01/07/2011  . Arthritis 12/20/2010  . Back pain 12/15/2010  . Dissection of thoracic aorta ([place]) 12/11/2010  . H/O dissecting abdominal aortic aneurysm repair 08/24/2010  . IRRITABLE BOWEL SYNDROME 09/09/2009  . Obstructive sleep apnea 08/04/2009  . External hemorrhoid 06/26/2009  . Osteoporosis 01/09/2009  . Hyperlipidemia 07/26/2007  . Depression with anxiety 07/26/2007  . Essential hypertension 07/26/2007  . Allergic rhinitis 07/26/2007  . Asthma 07/26/2007  . GERD 07/26/2007  . Osteoarthritis 07/26/2007  . Ocular migraine 07/26/2007  . Mild intermittent asthma without complication 00/92/3300   Janna Arch, PT, DPT   09/16/2020, 2:30 PM  [place] MAIN [facility] SERVICES [address] [place], [place], [address] Phone: [phone]   Fax:  [phone]  Name: Rachel Torres MRN: [id number] Date of Birth: 04/12/1956

## 2020-09-18 ENCOUNTER — Ambulatory Visit: Payer: Medicare HMO

## 2020-09-18 ENCOUNTER — Other Ambulatory Visit: Payer: Self-pay

## 2020-09-18 DIAGNOSIS — M546 Pain in thoracic spine: Secondary | ICD-10-CM

## 2020-09-18 DIAGNOSIS — M545 Low back pain, unspecified: Secondary | ICD-10-CM

## 2020-09-18 DIAGNOSIS — M542 Cervicalgia: Secondary | ICD-10-CM

## 2020-09-18 DIAGNOSIS — R293 Abnormal posture: Secondary | ICD-10-CM

## 2020-09-18 DIAGNOSIS — G8929 Other chronic pain: Secondary | ICD-10-CM

## 2020-09-18 NOTE — Therapy (Signed)
[place] MAIN [facility] SERVICES [address] [place], [place], [address] Phone: [phone]   Fax:  [phone]  Physical Therapy Treatment  Patient Details  Name: Rachel Torres MRN: [id number] Date of Birth: 1956-08-27 Referring Provider (PT): Glori Bickers, Wynelle Fanny MD   Encounter Date: 09/18/2020   PT End of Session - 09/18/20 1413    Visit Number 29    Number of Visits 41    Date for PT Re-Evaluation 10/28/20    Authorization Type 9/10 8/20    Authorization Time Period back pain added to POC on 06/17/20    PT Start Time 1430    PT Stop Time 1514    PT Time Calculation (min) 44 min    Activity Tolerance Patient tolerated treatment well    Behavior During Therapy [facility] for tasks assessed/performed           Past Medical History:  Diagnosis Date  . AAA (abdominal aortic aneurysm) ([place])    a. Chronic w/o evidence of aneurysmal dil on CTA 01/2016.  Marland Kitchen Alcohol abuse   . Allergy   . Anemia   . Anxiety   . CAD (coronary artery disease)    a. 08/2010 s/p CABG x 1 (VG->RCA) @ time of Ao dissection repair; b. 01/2016 Lexiscan MV: mid antsept/apical defect w/ ? peri-infarct ischemia-->likely attenuation-->Med Rx.  . Chewing difficulty   . Chronic combined systolic (congestive) and diastolic (congestive) heart failure ([place])    a. 2012 EF 30-35%; b. 04/2015 EF 40-45%; c. 01/2016 Echo: Ef 55-65%, nl AoV bioprosthesis, nl RV, nl PASP.  Marland Kitchen Constipation   . Depression   . Edema of both lower extremities   . Gallbladder sludge   . GERD (gastroesophageal reflux disease)   . Heart failure ([place])   . Heart valve problem   . History of Bicuspid Aortic Valve    a. 08/2010 s/p AVR @ time of Ao dissection repair; b. 01/2016 Echo: Ef 55-65%, nl AoV bioprosthesis, nl RV, nl PASP.  Marland Kitchen Hx of repair of dissecting thoracic aortic aneurysm, [place] type A    a. 08/2010 s/p repair with AVR and VG->RCA; b. 01/2016 CTA: stable appearance of Asc Thoracic Aortic graft.  Opacification of flase lumen of chronic abd Ao dissection w/ retrograde flow through lumbar arteries. No aneurysmal dil of flase lumen.  . Hyperlipidemia   . Hypertensive heart disease   . IBS (irritable bowel syndrome)   . Internal hemorrhoids   . Kidney problem   . Marfan syndrome    pt denies  . Migraine   . Obstructive sleep apnea   . Osteoarthritis   . Osteoporosis   . Palpitations   . Pneumonia   . RA (rheumatoid arthritis) ([place])    a. Followed by Dr. [name]    Past Surgical History:  Procedure Laterality Date  . ABDOMINAL AORTIC ANEURYSM REPAIR    . CARDIAC CATHETERIZATION  2001  . [place]  . CHOLECYSTECTOMY    . COLONOSCOPY    . ESOPHAGOGASTRODUODENOSCOPY (EGD) WITH PROPOFOL N/A 05/08/2020   Procedure: ESOPHAGOGASTRODUODENOSCOPY (EGD) WITH PROPOFOL;  Surgeon: Virgel Manifold, MD;  Location: [facility] ENDOSCOPY;  Service: Endoscopy;  Laterality: N/A;  . FRACTURE SURGERY Right    shoulder replacement  . HEMORRHOID BANDING    . ORIF DISTAL RADIUS FRACTURE Left   . UPPER GASTROINTESTINAL ENDOSCOPY      There were no vitals filed for this visit.   Subjective Assessment - 09/18/20 1428  Subjective Patient denies any migraines since last session. Patient notes that neck is not as stiff as last session, but has been performing exercises.    Pertinent History Patient has been seen by this therapist in the past for back related injuries/pain. Was last seen by this author in December of 2019. Since discharge patient has had COVID and an upper GI due to stomach issues. PMH includes aorta type A dissection (2011) with aortic valve replacement, bypass of R Coronary artery, hypertension, hyperlipidemia, CHF, pre-diabetes, AFIB, anxiety, depression, arthritis, GERD, atypical migraine, asthma, allergic rhinitis. 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 Does now have help with aide.    Limitations Sitting;Reading;Lifting;Standing;Walking;Writing;Other  (comment);House hold activities    How long can you sit comfortably? 20 mins before whole body discomfort    How long can you stand comfortably? 5 mins  or less: lower back    How long can you walk comfortably? 2 minutes    Patient Stated Goals to decrease pain and migraines    Currently in Pain? Yes    Pain Score 2     Pain Location Back    Pain Orientation Lower    Pain Onset More than a month ago           Treatment:  Prone:  CPAs and UPAs grade II-IV to thoracic and lumbar spine, 5x 12 seconds each level. Hypomobility throughout with reduction with repetition.    J mobilization to upper thoracic/lower cervical for reduced thoracic kyphosis. 3x30 seconds       Supine:  Cervical side bend 2x 30 seconds with overpressure at occiput and glenohumeral joint for optimal muscle tissue lengthening  Suboccipital release 3x30 second holds    Isometric 90/90 with green physioball 12x 3 second holds. Patient requires verbal cues to maintain posterior pelvic tilt throughout exercise.    Seated: Seated thoracic extension stretch with ball behind back. 10 x 10 second holds.     Trigger point/soft tissue release with movement to improve soft tissue limited muscle tissue length of upper trap, levator scap, cervical paraspinals.x 8 minutes     Quadruped: Thread the needle 12x each side, 3 second holds Cat/cow stretch 10 x 10 second holds each direction   Pt educated throughout session about proper posture and technique with exercises. Improved exercise technique, movement                         PT Education - 09/18/20 1413    Education provided Yes    Education Details exercise technique, body mechanics    Person(s) Educated Patient    Methods Explanation;Tactile cues;Verbal cues;Demonstration    Comprehension Verbalized understanding;Returned demonstration;Verbal cues required;Tactile cues required            PT Short Term Goals - 09/02/20 1321       PT SHORT TERM GOAL #1   Title Patient will be independent in home exercise program to improve strength/mobility and decrease pain for better functional independence with ADLs.    Baseline 5/26: HEP next session 7/14; HEP compliant    Time 2    Period Weeks    Status Achieved    Target Date 05/28/20      PT SHORT TERM GOAL #2   Title Patient will report a worst pain of 7/10 on VAS in head to improve tolerance with ADLs and reduced symptoms with activities.    Baseline 5/26: 9/10 7/14: 7/10 9/14: 6/10    Time 2  Period Weeks    Status Achieved    Target Date 05/28/20      PT SHORT TERM GOAL #3   Title Patient will report a worst pain of 6/10 on VAS in thoracic and lumbar spine to improve tolerance with ADLs and reduced symptoms with activities.    Baseline 6/29: 8/10 pain 7/14: 8/10 8/20: thoracic 4/10 lumbar 8/10 9/14: thoracic 7/10 low back 8/10    Time 2    Period Weeks    Status Partially Met    Target Date 09/16/20             PT Long Term Goals - 09/02/20 1344      PT LONG TERM GOAL #1   Title Patient will report a worst pain of 3/10 on VAS in her head/migraines to improve tolerance with ADLs and reduced symptoms with activities    Baseline 5/26: 9/10 7/14: 7/10 8/20: 7/10 9/14: 6/10    Time 8    Period Weeks    Status Partially Met    Target Date 10/28/20      PT LONG TERM GOAL #2   Title Patient will increase FOTO score to equal to or greater than   45/100  to demonstrate statistically significant improvement in mobility and quality of life.    Baseline 5/26: 44 with risk adjusted 33/100 7/14: 38.7% 8/20: 43% 9/14: 42%    Time 8    Period Weeks    Status Partially Met    Target Date 10/28/20      PT LONG TERM GOAL #3   Title Patient will reduce dizziness handicap inventory score to <30, for less dizziness with ADLs and increased safety with home and work tasks.    Baseline 5/26: 40 7/14: 34% 8/20: 32% 9/14: 40%    Time 8    Period Weeks    Status Partially  Met    Target Date 10/28/20      PT LONG TERM GOAL #4   Title Patient will improve cervical rotation ROM to >/=60 degrees bilaterally to improve functional mobility and demonstrate improved muscle tissue length    Baseline 5/26: R 46 L 42 7/14: assess next session 7/21: R 51 L 49 8/20: L 60 R 51 9/14: WFL bilaterally    Time 8    Period Weeks    Status Achieved      PT LONG TERM GOAL #5   Title Patient will report a worst pain of 3/10 on VAS in  thoracic and lumbar spine    to improve tolerance with ADLs and reduced symptoms with activities and caregiver duties    Baseline 6/29: 8/10 7/13: 8/10 8/20: thoracic 4/10 lumbar 8/10 9/14: thoracic 4/10 lumbar 8/10    Time 8    Period Weeks    Status Partially Met    Target Date 10/28/20      PT LONG TERM GOAL #6   Title Patient will tolerate sitting unsupported demonstrating erect sitting posture with minimal thoracic kyphosis for 15+ minutes with maximum of 5/10 back pain to demonstrate improved back extensor strength and improved sitting tolerance.    Baseline 6/29: painful and limited 7/14: can sit 15 minutes but has increasing forward head posture with prolonged hold 8/20: could drive 1 hour but had increase of pain to 8/10 9/14: able to perform with slight pain increase    Time 8    Period Weeks    Status Achieved      PT LONG TERM GOAL #7  Title Patient will reduce modified Oswestry score to <20 as to demonstrate minimal disability with ADLs including improved sleeping tolerance, walking/sitting tolerance etc for better mobility with ADLs.    Baseline 6/29: 46% 7/13: 49% 8/20: 56% 9/14: 56%    Time 8    Period Weeks    Status Partially Met    Target Date 10/28/20                 Plan - 09/18/20 1518    Clinical Impression Statement Patient demonstrates increased tolerance for spinal mobilizations, as patient did not note any exacerbation of pain. Exercises that had caused muscular spasm in the past were performed without  adverse reactions. Paraspinal and neck musculature continue to be guarded but are not tender to palpation. Patient will continue to benefit from skilled physical therapy to reduce pain, improve spinal mobility, and allow patient better quality of life.    Personal Factors and Comorbidities Age;Comorbidity 3+;Fitness;Finances;Past/Current Experience;Social Background;Time since onset of injury/illness/exacerbation;Other    Comorbidities aorta type A dissection (2011) with aortic valve replacement, bypass of R Coronary artery, hypertension, hyperlipidemia, CHF, pre-diabetes, AFIB, anxiety, depression, arthritis, GERD, atypical migraine, asthma, allergic rhinitis.    Examination-Activity Limitations Bend;Caring for Others;Carry;Dressing;Stairs;Squat;Sit;Reach Overhead;Locomotion Level;Lift;Stand;Toileting;Transfers    Examination-Participation Restrictions Church;Cleaning;Community Activity;Interpersonal Relationship;Driving;Laundry;Volunteer;Shop;Personal Finances;Meal Prep;Medication Management;Yard Work    Product manager    Rehab Potential Fair    Clinical Impairments Affecting Rehab Potential + CAD testing, history of AAA, anemia, anxiety, asthma, CAD, chronic combined ystolic heart failure, GERD, Marfan syndrome, OA, RA (+) good work Psychologist, forensic, motivated, age     PT Frequency 2x / week    PT Duration 8 weeks    PT Treatment/Interventions ADLs/Self Care Home Management;Aquatic Therapy;Vestibular;Vasopneumatic Device;Taping;Passive range of motion;Dry needling;Energy conservation;Manual techniques;Therapeutic exercise;Balance training;Neuromuscular re-education;Patient/family education;Therapeutic activities;Functional mobility training;Stair training;Gait training;Electrical Stimulation;Iontophoresis 23m/ml Dexamethasone;Moist Heat;Traction;Ultrasound;Cryotherapy;Canalith Repostioning;Joint Manipulations;Spinal Manipulations    PT Next Visit Plan back  interventions; posture    PT Home Exercise Plan Educated in seated Lev Scap and U/T stretches for HEP, as well as scap pinches and chin tucks.    Consulted and Agree with Plan of Care Patient           Patient will benefit from skilled therapeutic intervention in order to improve the following deficits and impairments:  Cardiopulmonary status limiting activity, Decreased activity tolerance, Decreased endurance, Decreased mobility, Decreased range of motion, Decreased strength, Difficulty walking, Dizziness, Hypomobility, Impaired flexibility, Impaired perceived functional ability, Increased muscle spasms, Impaired UE functional use, Impaired vision/preception, Obesity, Postural dysfunction, Improper body mechanics, Pain, Abnormal gait  Visit Diagnosis: Cervicalgia  Chronic low back pain, unspecified back pain laterality, unspecified whether sciatica present  Abnormal posture  Pain in thoracic spine     Problem List Patient Active Problem List   Diagnosis Date Noted  . Acute bronchitis with bronchospasm 05/28/2020  . Diarrhea 04/11/2020  . Epigastric pain 02/19/2020  . Dark stools 02/19/2020  . [place] 02/05[place]  . Shortness of breath 01/27/2019  . Irregular surface of cornea 12/25/2018  . HPV (human papilloma virus) infection 12/01/2016  . Screening mammogram, encounter for 11/29/2016  . Estrogen deficiency 11/29/2016  . Encounter for routine gynecological examination 11/29/2016  . Grade III hemorrhoids 10/06/2016  . Tachycardia 08/14/2016  . Hemorrhoids 08/13/2016  . Atypical migraine 08/03/2016  . CAD (coronary artery disease)   . [place]   . Chronic combined systolic (congestive) and diastolic (congestive) heart failure ([place]   . Prediabetes  02/11/2016  . Generalized anxiety disorder 12/08/2015  . Panic attacks 07/28/2015  . Sensorineural hearing loss 03/27/2013  . Vitamin D deficiency 07/03/2012  .  H/O aortic dissection 05/12/2012  . Class 2 obesity due to excess calories with body mass index (BMI) of 35.0 to 35.9 in adult 11/21/2011  . Obesity (BMI 30.0-34.9) 11/21/2011  . Routine general medical examination at a health care facility 09/20/2011  . S/P AVR (aortic valve replacement) 01/07/2011  . CORONARY ARTERY BYPASS GRAFT, HX OF 01/07/2011  . Arthritis 12/20/2010  . Back pain 12/15/2010  . Dissection of thoracic aorta ([place]) 12/11/2010  . H/O dissecting abdominal aortic aneurysm repair 08/24/2010  . IRRITABLE BOWEL SYNDROME 09/09/2009  . Obstructive sleep apnea 08/04/2009  . External hemorrhoid 06/26/2009  . Osteoporosis 01/09/2009  . Hyperlipidemia 07/26/2007  . Depression with anxiety 07/26/2007  . Essential hypertension 07/26/2007  . Allergic rhinitis 07/26/2007  . Asthma 07/26/2007  . GERD 07/26/2007  . Osteoarthritis 07/26/2007  . Ocular migraine 07/26/2007  . Mild intermittent asthma without complication 26/41/5830   Tonny Bollman, SPT  This entire session was performed under direct supervision and direction of a licensed therapist/therapist assistant . I have personally read, edited and approve of the note as written.  Janna Arch, PT, DPT   09/18/2020, 5:02 PM  [place] MAIN [facility] SERVICES [address] [place], [place], [address] Phone: [phone]   Fax:  [phone]  Name: DIAHN WAIDELICH MRN: [id number] Date of Birth: 02/12/1956

## 2020-09-19 NOTE — Chronic Care Management (AMB) (Signed)
Chronic Care Management Pharmacy  Name: Rachel Torres  MRN: [id number] DOB: Aug 18, 1956  Chief Complaint/ HPI  Rachel Torres,  64 y.o., female presents for their Follow-Up CCM visit with the clinical pharmacist via telephone.  PCP : [name], MD  Their chronic conditions include: hypertension, hyperlipidemia, CHF, pre-diabetes, AFIB, anxiety, depression, arthritis, GERD, atypical migraine, asthma, allergic rhinitis  Patient concerns: still having stomach pain, reflux, gas  Office Visits:  06/02/20: [facility] - encouraged to go to [facility] for Chest X-ray.   05/28/20: [facility] - acute bronchitis. Keflex and dexamethasone prescribed.   04/11/20: [facility] - epigastric pain improved slightly with increased PPI dose. Referral for GI. Patient has nausea and diarrhea. Ordered covid testing, bland diet, rest/fluids.   02/19/20: [facility] - Epigastric pain, dark stools; increase omeprazole to BID, possible gastritis; labs, bland diet, avoid NSAIDs, update if not improving in 1 week, then consider H2 blocker and GI referral   01/01/20: [facility] - neuropathy, cold toes, return is worsen or fail to improve   12/14/19: COVID-19 positive  06/20/19: [facility] - continue current medications Consult Visit:   08-09/21: Physical therapy visits for cervicalgia.   09/03/20: Bariatrics - continue diet and exercise.   07/23/20: Bariatrics - continue diet and exercise.   07/16/20: GI - decrease omeprazole to once daily since symptoms have improved. If well contorlled, discontinue in 1-2 months and use pepcid for recurrent symptoms.   07/01/20: Bariatrics - nystatin poweder for yeast. Continue with reduced carbohydrate and exercise.   06/18/20: Bariatrics - diet and exercise plan.   06/03/20: ED - solumedrol injection given in clinic. Will extend a 5 day course of antibiotics. Increase fluids. Chest x-ray completed.   05/20/20: Bariatrics- fluconazole given for yeast under abdomen. Encouraged increased fiber  and  05/08/20: GI performed EGD.   05/05/20: Bariatrics - advised to diet modifications. Labs drawn to assess thyroid.   04/22/20: Cardio [facility] - continue current medications. Advised to cut back on fatty foods and weight loss.   04/15/20: GI [place] - short-term use of GI cocktail for heartburn. EGD ordered to evaluate for Barrett's esophagus.   11/27/19: Pulmonology - Flora Lipps, continue albuterol as needed, no antibiotics recommended for bronchitis  09/10/19: [facility] - begin cardiac rehab, extra Lasix  07/09/19: Cardiology - Christell Faith, echo, rtc 6-12 months  Allergies  Allergen Reactions  . Fosamax [Alendronate Sodium]     GI upset   . Ginzing [Ginseng] Other (See Comments)    Hyper and jitery   . Hydrocod Polst-Cpm Polst Er Itching  . Hydrocodone-Acetaminophen Itching  . Imitrex [Sumatriptan]     Contraindicated w/ heart condition  . Oxycodone Itching  . Peanut-Containing Drug Products Other (See Comments)    Reaction:  Migraines   . Tramadol Other (See Comments)    itching  . Hydrocodone-Guaifenesin Itching  . Prednisone Rash  . Tape Itching   Medications: Outpatient Encounter Medications as of 09/22/2020  Medication Sig Note  . albuterol (PROVENTIL HFA;VENTOLIN HFA) 108 (90 Base) MCG/ACT inhaler Inhale 1-2 puffs into the lungs every 6 (six) hours as needed for wheezing or shortness of breath.   Marland Kitchen albuterol (PROVENTIL) (5 MG/ML) 0.5% nebulizer solution Take 0.5 mLs (2.5 mg total) by nebulization every 6 (six) hours as needed for wheezing or shortness of breath.   . ALPRAZolam (XANAX) 0.5 MG tablet Take 1 tablet (0.5 mg total) by mouth daily as needed for anxiety.   Marland Kitchen aspirin EC 81 MG tablet Take 81 mg by mouth at  bedtime.   Marland Kitchen atorvastatin (LIPITOR) 20 MG tablet TAKE 1 TABLET EVERY DAY (Patient taking differently: Take 20 mg by mouth every other day. )   . carvedilol (COREG) 6.25 MG tablet TAKE 1 TABLET TWICE DAILY WITH A MEAL   . Cholecalciferol (VITAMIN D3) 5000  units CAPS Take 1 capsule by mouth daily.   . cyclobenzaprine (FLEXERIL) 10 MG tablet Take 0.5-1 tablets (5-10 mg total) by mouth 3 (three) times daily as needed. For headache   . furosemide (LASIX) 20 MG tablet Take 1 tablet (20 mg total) by mouth daily. Take 1 tablet (20 mg) daily, may take additional 1 tablet (20 mg) in the afternoon as needed for swelling, shortness of breath, or weight gain   . Krill Oil (OMEGA-3) 500 MG CAPS Take 1 capsule by mouth daily.   Marland Kitchen lisinopril (ZESTRIL) 5 MG tablet TAKE 1/2 TABLET EVERY DAY   . magnesium gluconate (MAGONATE) 500 MG tablet Take 500 mg by mouth at bedtime.   . Misc Natural Products (TART CHERRY ADVANCED) CAPS Take 1 capsule by mouth daily.   . naproxen sodium (ANAPROX) 220 MG tablet Take 440 mg by mouth 2 (two) times daily as needed (for headaches/pain). 10/06/2016: PRN   . nystatin (MYCOSTATIN/NYSTOP) powder Apply 1 application topically 2 (two) times daily. Apply to affected area twice a day for 14 days   . omeprazole (PRILOSEC) 40 MG capsule Take 1 capsule (40 mg total) by mouth daily.   . potassium chloride (KLOR-CON) 10 MEQ tablet Take 1 tablet (10 mEq total) by mouth 2 (two) times daily as needed. Take additional pill when increasing Furosemide.   . promethazine (PHENERGAN) 25 MG tablet Take 0.5 tablets (12.5 mg total) by mouth every 8 (eight) hours as needed for nausea.   . Simethicone (MYLANTA GAS PO) Take by mouth 3 times/day as needed-between meals & bedtime.   Marland Kitchen venlafaxine XR (EFFEXOR-XR) 150 MG 24 hr capsule TAKE 1 CAPSULE EVERY DAY WITH BREAKFAST    No facility-administered encounter medications on file as of 09/22/2020.   Allergies  Allergen Reactions  . Fosamax [Alendronate Sodium]     GI upset   . Ginzing [Ginseng] Other (See Comments)    Hyper and jitery   . Hydrocod Polst-Cpm Polst Er Itching  . Hydrocodone-Acetaminophen Itching  . Imitrex [Sumatriptan]     Contraindicated w/ heart condition  . Oxycodone Itching  .  Peanut-Containing Drug Products Other (See Comments)    Reaction:  Migraines   . Tramadol Other (See Comments)    itching  . Hydrocodone-Guaifenesin Itching  . Prednisone Rash  . Tape Itching   SDOH Screenings   Alcohol Screen:   . Last Alcohol Screening Score (AUDIT): Not on file  Depression (PHQ2-9): Medium Risk  . PHQ-2 Score: 12  Financial Resource Strain: Low Risk   . Difficulty of Paying Living Expenses: Not hard at all  Food Insecurity:   . Worried About Charity fundraiser in the Last Year: Not on file  . Ran Out of Food in the Last Year: Not on file  Housing:   . Last Housing Risk Score: Not on file  Physical Activity:   . Days of Exercise per Week: Not on file  . Minutes of Exercise per Session: Not on file  Social Connections:   . Frequency of Communication with Friends and Family: Not on file  . Frequency of Social Gatherings with Friends and Family: Not on file  . Attends Religious Services: Not on file  .  Active Member of Clubs or Organizations: Not on file  . Attends Archivist Meetings: Not on file  . Marital Status: Not on file  Stress:   . Feeling of Stress : Not on file  Tobacco Use: Medium Risk  . Smoking Tobacco Use: Former Smoker  . Smokeless Tobacco Use: Never Used  Transportation Needs:   . Film/video editor (Medical): Not on file  . Lack of Transportation (Non-Medical): Not on file     Current Diagnosis/Assessment:   Financial Resource Strain: Low Risk   . Difficulty of Paying Living Expenses: Not hard at all   Goals    . Patient Stated     Starting 06/05/19, I will continue to take medications as prescribed.     . Pharmacy Care Plan     CARE PLAN ENTRY (see longitudinal plan of care for additional care plan information)  Current Barriers:  . Chronic Disease Management support, education, and care coordination needs related to Hypertension, Hyperlipidemia, and GERD   Hypertension BP Readings from Last 3 Encounters:    09/03/20 102/68  07/23/20 119/76  07/16/20 (!) 93/59   . Pharmacist Clinical Goal(s): o Over the next 90 days, patient will work with PharmD and providers to maintain BP goal <130/80 . Current regimen:   furosemide (LASIX) 20 MG tablet - 1 tablet up to TID  carvedilol (COREG) 6.25 MG tablet - 1 tablet BID  lisinopril (ZESTRIL) 5 MG tablet - 1 tablet daily  potassium chloride (K-DUR) 10 MEQ tablet - 1 tablet BID PRN . Interventions: o Reviewed patient's home medications.  o Encouraged patient to keep up healthy living habits with diet and exercise.  o Reviewed patient's swelling/furosemide use.  . Patient self care activities - Over the next 90 days, patient will: o Continue with healthy diet and exercise habits.  o Monitor for swelling and use extra furosemide doses as needed.  o Ensure daily salt intake < 2300 mg/day  Hyperlipidemia Lab Results  Component Value Date/Time   LDLCALC 122 (H) 07/23/2020 03:33 PM   LDLDIRECT 189.4 10/13/2011 10:54 AM   . Pharmacist Clinical Goal(s): o Over the next 90 days, patient will work with PharmD and providers to achieve LDL goal < 70 . Current regimen:   aspirin EC 81 MG tablet - 1 tablet daily  Krill Oil (OMEGA-3) 500 MG CAPS - 1 capsule daily  atorvastatin (LIPITOR) 20 MG tablet - 1 tablet  daily M/W/F   nitroGLYCERIN (NITROSTAT) 0.4 MG SL tablet - has not needed recently, checks BP prior to using and only uses if BP is high . Interventions: o Reviewed vitamin D leve, lipid panel and medication use.  o Encouraged patient to continue with healthy eating and diet to improve cholesterol.  o Discussed benefits of weight loss and heart health.  o Encouraged patient to continue using atorvastatin three times weekly.  . Patient self care activities - Over the next 90 days, patient will: o Continue taking atorvastatin 20 mg three times weekly (M/W/F).   GERD . Pharmacist Clinical Goal(s) o Over the next 90 days, patient will work  with PharmD and providers to manage symptoms of GERD . Current regimen:  o Omeprazole 40 mg capsule daily o Simethicone three times daily prn o Famotiding OTC as needed for acid reflux symptoms . Interventions: o Discussed recommendation to reduce omeprazole dosing and consider discontinuing omeprazole.  o Reviewed patient's triggers to symptoms.  . Patient self care activities - Over the next 90 days, patient  will: o Continue taking omeprazole 40 mg daily and considering discontinuing if tolerated.   Medication management . Pharmacist Clinical Goal(s): o Over the next 90 days, patient will work with PharmD and providers to maintain optimal medication adherence . Current pharmacy: [facility] . Interventions o Comprehensive medication review performed. o Reviewed adherence data - patient 100% adherent to statin and blood pressure medication.  o Discussed need for mammogram - patient plans to schedule.  o Continue current medication management strategy . Patient self care activities - Over the next 90 days, patient will: o Focus on medication adherence by using pill box.  o Take medications as prescribed o Report any questions or concerns to PharmD and/or provider(s)  Please see past updates related to this goal by clicking on the "Past Updates" button in the selected goal         AFIB   CBC Latest Ref Rng & Units 02/19/2020 06/07/2019 01/21/2018  WBC 4.0 - 10.5 K/uL 5.8 4.8 5.6  Hemoglobin 12.0 - 15.0 g/dL 13.0 12.8 13.3  Hematocrit 36 - 46 % 38.5 37.4 38.7  Platelets 150 - 400 K/uL 284.0 248.0 259   Followed by Dr. [name] Patient is currently rate controlled. S/p ablation, artificial heart valve (bioprosthetic AVR) HR: 70-80    Per cardiology note: She was seen in the ED in 01/2018 and told she had Afib. She was prescribed Coumadin through the ED, though she did not start this. Upon her cardiologist reviewing the EKGs, she was noted to have sinus rhythm with PACs. There  was no evidence of Afib.   Patient has failed these meds in past: none Patient is currently controlled on the following medications:  Carvedilol (COREG) 6.25 MG tablet - 1 tablet BID with meal  Magnesium gluconate (MAGONATE) 500 MG tablet - 1 tablet at bedtime  Aspirin 81 mg - 1 tablet daily  We discussed: per cardiology, aspirin sufficient; CBC stable  Plan: Continue current medications  Asthma   Last spirometry score: 09/2019 FEV1 93%  Eosinophil count:   Lab Results  Component Value Date/Time   EOSPCT 3.5 02/19/2020 01:00 PM   EOSPCT 3.8 05/12/2012 12:12 PM  %                               Eos (Absolute):  Lab Results  Component Value Date/Time   EOSABS 0.2 02/19/2020 01:00 PM   EOSABS 0.2 05/12/2012 12:12 PM   Tobacco Status:  Social History   Tobacco Use  Smoking Status Former Smoker  . Packs/day: 0.75  . Years: 1.50  . Pack years: 1.12  . Types: Cigarettes  . Quit date: 12/20/1978  . Years since quitting: 41.7  Smokeless Tobacco Never Used   Patient has failed these meds in past: none Patient is currently controlled on the following medications:   Albuterol (PROVENTIL HFA;VENTOLIN HFA) 108 (90 Base) MCG/ACT inhaler - uses PRN respiratory illness  Using maintenance inhaler regularly? None prescribed Frequency of rescue inhaler use:  infrequently  We discussed: reports bronchitis 1-2x a year, reports pulmonologist does not think she has asthma  Update 09/22/20: Patient is going to begin pulmonary rehab soon. Still has shortness of breath but is working through it. Rarely has to use rescue inhaler.   Plan: Continue current medications  Heart Failure   Type: Combined Systolic and Diastolic Last ejection fraction: 07/2019 45-50% NYHA Class: II (slight limitation of activity) AHA HF Stage: C (Heart disease  and symptoms present)  Patient has failed these meds in past: none Patient is currently controlled on the following medications:   furosemide  (LASIX) 20 MG tablet - 1 tablet daily (up to TID PRN)  carvedilol (COREG) 6.25 MG tablet - 1 tablet BID  lisinopril (ZESTRIL) 5 MG tablet - 1 tablet daily  potassium chloride (K-DUR) 10 MEQ tablet - 1 tablet BID PRN  We discussed: swelling is usually in mid-section, takes furosemide TID PRN swelling; does not weigh daily; denies current swelling Reports some shortness of breath, worsened with acid reflux, SOB limits ability to do household chores  Update 09/22/20: Managing swelling with 1-2 tablets of furosemide daily. Has been busy and not drinking water like she is supposed to. She is working to increase hydration by keeping a large bottle of water nearby. Taking 2 tablets daily this week due to slight increase in swelling.   Plan: Continue current medications; Plans to complete weight loss program with [facility].    Hypertension   CMP Latest Ref Rng & Units 07/23/2020 02/19/2020 06/07/2019  Glucose 65 - 99 mg/dL 101(H) 97 102(H)  BUN 8 - 27 mg/dL 18 12 19   Creatinine 0.57 - 1.00 mg/dL 0.91 0.91 0.88  Sodium 134 - 144 mmol/L 139 140 138  Potassium 3.5 - 5.2 mmol/L 4.6 4.1 4.1  Chloride 96 - 106 mmol/L 100 102 103  CO2 20 - 29 mmol/L 25 31 27   Calcium 8.7 - 10.3 mg/dL 10.3 9.3 8.9  Total Protein 6.0 - 8.5 g/dL 7.4 7.4 6.7  Total Bilirubin 0.0 - 1.2 mg/dL 0.3 0.6 0.5  Alkaline Phos 48 - 121 IU/L 82 67 64  AST 0 - 40 IU/L 22 22 20   ALT 0 - 32 IU/L 20 23 18    Office blood pressures are: BP Readings from Last 3 Encounters:  09/03/20 102/68  07/23/20 119/76  07/16/20 (!) 93/59   BP goal < 140/90 mmHg Patient has failed these meds in the past: none Patient checks BP at home 1-2x per week Patient home BP readings are ranging: 130s/80s, denies lows  Patient is currently controlled on the following medications:   furosemide (LASIX) 20 MG tablet - 1 tablet up to TID  carvedilol (COREG) 6.25 MG tablet - 1 tablet BID  lisinopril (ZESTRIL) 5 MG tablet - 1 tablet daily  potassium chloride  (K-DUR) 10 MEQ tablet - 1 tablet BID PRN  Update 09/22/20: Energy level is getting better with healthy weight loss plan. BP is well managed. Blood work panel is improved. A1C is perfect per patient. She is pleased with weight loss and bp management. .   Plan: Continue current medications  Hyperlipidemia/CAD   Lipid Panel     Component Value Date/Time   CHOL 198 07/23/2020 1533   TRIG 136 07/23/2020 1533   TRIG 110 12/29/2006 1251   HDL 52 07/23/2020 1533   CHOLHDL 3 06/07/2019 1038   VLDL 21.6 06/07/2019 1038   LDLCALC 122 (H) 07/23/2020 1533   LDLDIRECT 189.4 10/13/2011 1054   LABVLDL 24 07/23/2020 1533    LDL goal < 70 (CAD) Patient has failed these meds in past: none  Patient is currently uncontrolled on the following medications:   aspirin EC 81 MG tablet - 1 tablet daily  Krill Oil (OMEGA-3) 500 MG CAPS - 1 capsule daily  atorvastatin (LIPITOR) 20 MG tablet - 1 tablet daily (reports taking for 2-3 weeks then off for a week, due to muscle pain)   nitroGLYCERIN (NITROSTAT) 0.4 MG  SL tablet - has not needed recently, checks BP prior to using and only uses if BP is high  We discussed: Instead of skipping a week of atorvastatin sporadically, try taking every M,W,F  Update 09/22/20 - taking atorvastatin M/W/F to minimize leg cramps. Has had to take a week break occasionally due to leg cramps. Indicates that lipid panel is best in years. She will continue with lifestyle modifications and atorvastatin Monday, Wednesday, Friday.   Plan: Continue current medications and diet/exercise.   Anxiety/Depression   Patient has failed these meds in past: none Patient is currently controlled on the following medications:   ALPRAZolam (XANAX) 0.5 MG tablet - takes sparingly  venlafaxine XR (EFFEXOR-XR) 150 MG 24 hr capsule - 1 capsule with breakfast  We discussed: sees therapist every 2 weeks; 30 tablets of Xanax lasts about a year, may take 1/2 tablet for sleep or 1/2 for stressful  season  Update 09/22/20 - Seeing therapist. 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 Her mom is moving to a senior living apartment soon. Patient reports that this will alleviate some of her stress.   Plan: Continue current medications  GERD   Symptoms: chest pain, feels full, stomach bloated Patient has failed these meds in past: none Patient is currently uncontrolled on the following medications:   omeprazole (PRILOSEC) 40 MG capsule - 1 capsule daily  Simethicone (MYLANTA GAS PO) - TID PRN  Famotidine OTC daily prn heart burn symptoms  We discussed: Reports symptoms are still bothersome since increasing PPI to BID early March.Taking omeprazole before breakfast and before bedtime; taking Mylanta PRN (up to 2-3 time a day); usually evening meal is biggest meal of the day; symptoms are worse at night; would like GI referral, more chronic than ever been.   Food triggers: Avoiding tomato sauces and spicy foods, still drinking coffee Takes aspirin daily, denies other NSAIDs - will take occasional naproxen or Tylenol as needed  Update 09/22/20:  Discussed GI recommendation to discontinue omeprazole.Patient is not ready to discontinue completely at this time.  Most days managing with 1 omeprazole daily. Does not feel that she will be able to discontinue at this time. Uses simethicone as needed. Discussed famotidine prn use. Patient has purchased but not used it yet. Mylanta seems to help the most. Stress and busy days seems to be her main trigger.  Plan: Continue current medications; Recommended working to discontinue omeprazole and use famotidine prn.   Vitamin D Deficiency/Osteoporosis   DEXA 2018 Vit D: 06/07/19 70 Patient has failed these meds in past: alendronate.  Patient is currently controlled on the following medications:   Cholecalciferol (VITAMIN D3) 5000 units CAPS - 1 capsule daily   We discussed: did not tolerate Fosamax; recommend daily  calcium intake 1200 mg   Update 09/22/20:  Patient reports lots of dairy sources in her diet daily. She reports not needing supplementation due to daily calcium intake. She is exercising and hopes to begin walking outdoors now that the weather is improving. She also has a friend joining the [employer] and hopes to join her some. Discussed the importance of both calcium intake and exercise for bone health.   Plan: Continue current medications    Migraines    Patient has failed these meds in past: none Patient is currently controlled on the following medications:   cyclobenzaprine (FLEXERIL) 10 MG tablet - 1/2 to 1 tablet TID PRN headache  promethazine (PHENERGAN) 25 MG tablet - 1/2 tablet TID  PRN nausea  We discussed: Yesterday took Flexeril and Promethazine for migraine - working okay, keeps migraine controlled; somewhat weather and stress related; may go 1-2 months without a migraine, sometimes up to 3-4 times a month  Update 09/22/20: Patient has had 2 headaches requiring medication in the past month. Has been under extra stress lately. Feels that she has been doing well avoiding rebound headaches.   Plan: Continue current medications   Vaccines   Reviewed and discussed patient's vaccination history.     Immunization History  Administered Date(s) Administered  . H1N1 01/09/2009  . Influenza Split 09/20/2011, 09/11/2012  . Influenza Whole 12/29/2006, 09/19/2008, 10/08/2010  . Influenza, Seasonal, Injecte, Preservative Fre 10/08/2010  . Influenza,inj,Quad PF,6+ Mos 10/08/2013, 08/13/2016, 10/27/2017, 10/05/2018, 09/17/2019  . Influenza-Unspecified 09/26/2015  . PFIZER SARS-COV-2 Vaccination 03/12/2020, 04/02/2020  . Pneumococcal Polysaccharide-23 12/29/2006  . Td 01/09/2009, 04/03/2019   Plan: Recommended patient receive Shingrix;    Update 09/22/20:  Recommended patient schedule third COVID vaccine after 10/02/2020.   Preventative health:   Patient plans to schedule mammogram  in the next month.   Medication Management  OTCs: Tart Cherry (inflammation) - 1 tablet daily; denies additional OTC items   Pharmacy/Benefits: [facility] (maintenance medications - no copay) /[facility] (acute meds) ([employer])  Adherence: pillbox, 100% adherence per data reviewed  Affordability: no concerns  CCM Follow Up: 03/24/2020 @ 3 pm (preferably in person if COVID improves)  Sherre Poot, PharmD, [facility] Clinical Pharmacist [place] [phone] (office) [phone] (mobile)

## 2020-09-22 ENCOUNTER — Ambulatory Visit: Payer: TRICARE For Life (TFL)

## 2020-09-22 ENCOUNTER — Other Ambulatory Visit: Payer: Self-pay

## 2020-09-22 ENCOUNTER — Ambulatory Visit (INDEPENDENT_AMBULATORY_CARE_PROVIDER_SITE_OTHER): Payer: Medicare HMO | Admitting: Psychology

## 2020-09-22 DIAGNOSIS — F4323 Adjustment disorder with mixed anxiety and depressed mood: Secondary | ICD-10-CM | POA: Diagnosis not present

## 2020-09-22 DIAGNOSIS — I10 Essential (primary) hypertension: Secondary | ICD-10-CM

## 2020-09-22 DIAGNOSIS — E7849 Other hyperlipidemia: Secondary | ICD-10-CM

## 2020-09-22 NOTE — Patient Instructions (Addendum)
Visit Information  Goals Addressed            This Visit's Progress   . Pharmacy Care Plan       CARE PLAN ENTRY (see longitudinal plan of care for additional care plan information)  Current Barriers:  . Chronic Disease Management support, education, and care coordination needs related to Hypertension, Hyperlipidemia, and GERD   Hypertension BP Readings from Last 3 Encounters:  09/03/20 102/68  07/23/20 119/76  07/16/20 (!) 93/59   . Pharmacist Clinical Goal(s): o Over the next 90 days, patient will work with PharmD and providers to maintain BP goal <130/80 . Current regimen:   furosemide (LASIX) 20 MG tablet - 1 tablet up to TID  carvedilol (COREG) 6.25 MG tablet - 1 tablet BID  lisinopril (ZESTRIL) 5 MG tablet - 1 tablet daily  potassium chloride (K-DUR) 10 MEQ tablet - 1 tablet BID PRN . Interventions: o Reviewed patient's home medications.  o Encouraged patient to keep up healthy living habits with diet and exercise.  o Reviewed patient's swelling/furosemide use.  . Patient self care activities - Over the next 90 days, patient will: o Continue with healthy diet and exercise habits.  o Monitor for swelling and use extra furosemide doses as needed.  o Ensure daily salt intake < 2300 mg/day  Hyperlipidemia Lab Results  Component Value Date/Time   LDLCALC 122 (H) 07/23/2020 03:33 PM   LDLDIRECT 189.4 10/13/2011 10:54 AM   . Pharmacist Clinical Goal(s): o Over the next 90 days, patient will work with PharmD and providers to achieve LDL goal < 70 . Current regimen:   aspirin EC 81 MG tablet - 1 tablet daily  Krill Oil (OMEGA-3) 500 MG CAPS - 1 capsule daily  atorvastatin (LIPITOR) 20 MG tablet - 1 tablet  daily M/W/F   nitroGLYCERIN (NITROSTAT) 0.4 MG SL tablet - has not needed recently, checks BP prior to using and only uses if BP is high . Interventions: o Reviewed vitamin D leve, lipid panel and medication use.  o Encouraged patient to continue with  healthy eating and diet to improve cholesterol.  o Discussed benefits of weight loss and heart health.  o Encouraged patient to continue using atorvastatin three times weekly.  . Patient self care activities - Over the next 90 days, patient will: o Continue taking atorvastatin 20 mg three times weekly (M/W/F).   GERD . Pharmacist Clinical Goal(s) o Over the next 90 days, patient will work with PharmD and providers to manage symptoms of GERD . Current regimen:  o Omeprazole 40 mg capsule daily o Simethicone three times daily prn o Famotiding OTC as needed for acid reflux symptoms . Interventions: o Discussed recommendation to reduce omeprazole dosing and consider discontinuing omeprazole.  o Reviewed patient's triggers to symptoms.  . Patient self care activities - Over the next 90 days, patient will: o Continue taking omeprazole 40 mg daily and considering discontinuing if tolerated.   Medication management . Pharmacist Clinical Goal(s): o Over the next 90 days, patient will work with PharmD and providers to maintain optimal medication adherence . Current pharmacy: [facility] . Interventions o Comprehensive medication review performed. o Reviewed adherence data - patient 100% adherent to statin and blood pressure medication.  o Discussed need for mammogram - patient plans to schedule.  o Continue current medication management strategy . Patient self care activities - Over the next 90 days, patient will: o Focus on medication adherence by using pill box.  o Take medications  as prescribed o Report any questions or concerns to PharmD and/or provider(s)   Please see past updates related to this goal by clicking on the "Past Updates" button in the selected goal          Patient verbalizes understanding of instructions provided today.   Telephone follow up appointment with pharmacy team member scheduled for: March 24, 2021 @ 3 pm   Sherre Poot, PharmD,  [facility] Clinical Pharmacist [facility] [phone] (office) [phone] (mobile)   Exercises To Do While Sitting  Exercises that you do while sitting (chair exercises) can give you many of the same benefits as full exercise. Benefits include strengthening your heart, burning calories, and keeping muscles and joints healthy. Exercise can also improve your mood and help with depression and anxiety. You may benefit from chair exercises if you are unable to do standing exercises because of:  Diabetic foot pain.  Obesity.  Illness.  Arthritis.  Recovery from surgery or injury.  Breathing problems.  Balance problems.  Another type of disability. Before starting chair exercises, check with your health care provider or a physical therapist to find out how much exercise you can tolerate and which exercises are safe for you. If your health care provider approves:  Start out slowly and build up over time. Aim to work up to about 10-20 minutes for each exercise session.  Make exercise part of your daily routine.  Drink water when you exercise. Do not wait until you are thirsty. Drink every 10-15 minutes.  Stop exercising right away if you have pain, nausea, shortness of breath, or dizziness.  If you are exercising in a wheelchair, make sure to lock the wheels.  Ask your health care provider whether you can do tai chi or yoga. Many positions in these mind-body exercises can be modified to do while seated. Warm-up Before starting other exercises: 1. Sit up as straight as you can. Have your knees bent at 90 degrees, which is the shape of the capital letter "L." Keep your feet flat on the floor. 2. Sit at the front edge of your chair, if you can. 3. Pull in (tighten) the muscles in your abdomen and stretch your spine and neck as straight as you can. Hold this position for a few minutes. 4. Breathe in and out evenly. Try to concentrate on your breathing, and relax your  mind. Stretching Exercise A: Arm stretch 1. Hold your arms out straight in front of your body. 2. Bend your hands at the wrist with your fingers pointing up, as if signaling someone to stop. Notice the slight tension in your forearms as you hold the position. 3. Keeping your arms out and your hands bent, rotate your hands outward as far as you can and hold this stretch. Aim to have your thumbs pointing up and your pinkie fingers pointing down. Slowly repeat arm stretches for one minute as tolerated. Exercise B: Leg stretch 1. If you can move your legs, try to "draw" letters on the floor with the toes of your foot. Write your name with one foot. 2. Write your name with the toes of your other foot. Slowly repeat the movements for one minute as tolerated. Exercise C: Reach for the sky 1. Reach your hands as far over your head as you can to stretch your spine. 2. Move your hands and arms as if you are climbing a rope. Slowly repeat the movements for one minute as tolerated. Range of motion exercises Exercise A: Shoulder  roll 1. Let your arms hang loosely at your sides. 2. Lift just your shoulders up toward your ears, then let them relax back down. 3. When your shoulders feel loose, rotate your shoulders in backward and forward circles. Do shoulder rolls slowly for one minute as tolerated. Exercise B: March in place 1. As if you are marching, pump your arms and lift your legs up and down. Lift your knees as high as you can. ? If you are unable to lift your knees, just pump your arms and move your ankles and feet up and down. March in place for one minute as tolerated. Exercise C: Seated jumping jacks 1. Let your arms hang down straight. 2. Keeping your arms straight, lift them up over your head. Aim to point your fingers to the ceiling. 3. While you lift your arms, straighten your legs and slide your heels along the floor to your sides, as wide as you can. 4. As you bring your arms back down  to your sides, slide your legs back together. ? If you are unable to use your legs, just move your arms. Slowly repeat seated jumping jacks for one minute as tolerated. Strengthening exercises Exercise A: Shoulder squeeze 1. Hold your arms straight out from your body to your sides, with your elbows bent and your fists pointed at the ceiling. 2. Keeping your arms in the bent position, move them forward so your elbows and forearms meet in front of your face. 3. Open your arms back out as wide as you can with your elbows still bent, until you feel your shoulder blades squeezing together. Hold for 5 seconds. Slowly repeat the movements forward and backward for one minute as tolerated. Contact a health care provider if you:  Had to stop exercising due to any of the following: ? Pain. ? Nausea. ? Shortness of breath. ? Dizziness. ? Fatigue.  Have significant pain or soreness after exercising. Get help right away if you have:  Chest pain.  Difficulty breathing. These symptoms may represent a serious problem that is an emergency. Do not wait to see if the symptoms will go away. Get medical help right away. Call your local emergency services (911 in the [place] Do not drive yourself to the hospital. This information is not intended to replace advice given to you by your health care provider. Make sure you discuss any questions you have with your health care provider. Document Revised: 03/29/2019 Document Reviewed: 10/19/2017 [employer]vier Patient Education  2020 [employer].

## 2020-09-23 ENCOUNTER — Other Ambulatory Visit: Payer: Self-pay

## 2020-09-23 ENCOUNTER — Ambulatory Visit: Payer: Medicare HMO | Attending: Family Medicine

## 2020-09-23 DIAGNOSIS — M546 Pain in thoracic spine: Secondary | ICD-10-CM | POA: Insufficient documentation

## 2020-09-23 DIAGNOSIS — M542 Cervicalgia: Secondary | ICD-10-CM | POA: Diagnosis not present

## 2020-09-23 DIAGNOSIS — G8929 Other chronic pain: Secondary | ICD-10-CM | POA: Insufficient documentation

## 2020-09-23 DIAGNOSIS — R293 Abnormal posture: Secondary | ICD-10-CM | POA: Diagnosis not present

## 2020-09-23 DIAGNOSIS — M545 Low back pain, unspecified: Secondary | ICD-10-CM | POA: Insufficient documentation

## 2020-09-23 NOTE — Therapy (Signed)
[place] MAIN [facility] SERVICES [address] [place], [place], [address] Phone: [phone]   Fax:  [phone]  Physical Therapy Treatment Physical Therapy Progress Note   Dates of reporting period  09/02/2020   to   09/23/2020  Patient Details  Name: Rachel Torres MRN: [id number] Date of Birth: 04-Feb-1956 Referring Provider (PT): Loura Pardon A MD   Encounter Date: 09/23/2020   PT End of Session - 09/23/20 1434    Visit Number 30    Number of Visits 41    Date for PT Re-Evaluation 10/28/20    Authorization Type 10/10 PN 09/23/20    Authorization Time Period back pain added to POC on 06/17/20    PT Start Time 1345    PT Stop Time 1429    PT Time Calculation (min) 44 min    Activity Tolerance Patient tolerated treatment well    Behavior During Therapy [facility] for tasks assessed/performed           Past Medical History:  Diagnosis Date  . AAA (abdominal aortic aneurysm) ([place])    a. Chronic w/o evidence of aneurysmal dil on CTA 01/2016.  Marland Kitchen Alcohol abuse   . Allergy   . Anemia   . Anxiety   . CAD (coronary artery disease)    a. 08/2010 s/p CABG x 1 (VG->RCA) @ time of Ao dissection repair; b. 01/2016 Lexiscan MV: mid antsept/apical defect w/ ? peri-infarct ischemia-->likely attenuation-->Med Rx.  . Chewing difficulty   . Chronic combined systolic (congestive) and diastolic (congestive) heart failure ([place])    a. 2012 EF 30-35%; b. 04/2015 EF 40-45%; c. 01/2016 Echo: Ef 55-65%, nl AoV bioprosthesis, nl RV, nl PASP.  Marland Kitchen Constipation   . Depression   . Edema of both lower extremities   . Gallbladder sludge   . GERD (gastroesophageal reflux disease)   . Heart failure ([place])   . Heart valve problem   . History of Bicuspid Aortic Valve    a. 08/2010 s/p AVR @ time of Ao dissection repair; b. 01/2016 Echo: Ef 55-65%, nl AoV bioprosthesis, nl RV, nl PASP.  Marland Kitchen Hx of repair of dissecting thoracic aortic aneurysm, [place] type A    a. 08/2010 s/p  repair with AVR and VG->RCA; b. 01/2016 CTA: stable appearance of Asc Thoracic Aortic graft. Opacification of flase lumen of chronic abd Ao dissection w/ retrograde flow through lumbar arteries. No aneurysmal dil of flase lumen.  . Hyperlipidemia   . Hypertensive heart disease   . IBS (irritable bowel syndrome)   . Internal hemorrhoids   . Kidney problem   . Marfan syndrome    pt denies  . Migraine   . Obstructive sleep apnea   . Osteoarthritis   . Osteoporosis   . Palpitations   . Pneumonia   . RA (rheumatoid arthritis) ([place])    a. Followed by Dr. [name]    Past Surgical History:  Procedure Laterality Date  . ABDOMINAL AORTIC ANEURYSM REPAIR    . CARDIAC CATHETERIZATION  2001  . [place]  . CHOLECYSTECTOMY    . COLONOSCOPY    . ESOPHAGOGASTRODUODENOSCOPY (EGD) WITH PROPOFOL N/A 05/08/2020   Procedure: ESOPHAGOGASTRODUODENOSCOPY (EGD) WITH PROPOFOL;  Surgeon: Virgel Manifold, MD;  Location: [facility] ENDOSCOPY;  Service: Endoscopy;  Laterality: N/A;  . FRACTURE SURGERY Right    shoulder replacement  . HEMORRHOID BANDING    . ORIF DISTAL RADIUS FRACTURE Left   . UPPER GASTROINTESTINAL ENDOSCOPY  There were no vitals filed for this visit.   Subjective Assessment - 09/23/20 1346    Subjective Patient denies any migraines since last session. Has been performing exercises at home with no provocation of pain. Patient notes she has been under a little more stress due to caregiver duties.    Pertinent History Patient has been seen by this therapist in the past for back related injuries/pain. Was last seen by this author in December of 2019. Since discharge patient has had COVID and an upper GI due to stomach issues. PMH includes aorta type A dissection (2011) with aortic valve replacement, bypass of R Coronary artery, hypertension, hyperlipidemia, CHF, pre-diabetes, AFIB, anxiety, depression, arthritis, GERD, atypical migraine, asthma, allergic rhinitis. 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 Does now have help with aide.    Limitations Sitting;Reading;Lifting;Standing;Walking;Writing;Other (comment);House hold activities    How long can you sit comfortably? 20 mins before whole body discomfort    How long can you stand comfortably? 5 mins  or less: lower back    How long can you walk comfortably? 2 minutes    Patient Stated Goals to decrease pain and migraines    Currently in Pain? Yes    Pain Score 1     Pain Location Back    Pain Orientation Right;Lower    Pain Descriptors / Indicators Aching    Pain Type Chronic pain    Pain Onset More than a month ago    Pain Frequency Intermittent              Goals: VAS worst for head: 4/10 VAS worst for thoracic, lumbar: 4/10 for both  MODI: 42% DHI: 16/100 (mild handicap) FOTO: 59% Unsupported sitting: able to perform with some back discomfort, but patient denies any pain.  Treatment:   Prone:  CPAs and UPAs grade II-IV to thoracic and lumbar spine, 5x 12 seconds each level. Hypomobility throughout with reduction with repetition.    J mobilization to upper thoracic/lower cervical for reduced thoracic kyphosis. 3x30 seconds       Supine:  Cervical side bend 2x 30 seconds with overpressure at occiput and glenohumeral joint for optimal muscle tissue lengthening  Cervical rotation. 2x 30 seconds with overpressure at occiput for maximal stretch  Suboccipital release 3x30 second holds   BUE/BLE handoffs with green physioball for core strengthening. SPT cues to maintain core activation throughout task. Performed 6x  Patient's condition has the potential to improve in response to therapy. Maximum improvement is yet to be obtained. The anticipated improvement is attainable and reasonable in a generally predictable time.  Patient reports being 50% or more back to normal, mostly with posture and back pain. Patient notes that exercises have been improving pain, but needs to review with PT  for correct performance execution.                       PT Education - 09/23/20 1337    Education provided Yes    Education Details goals, body mechanics,exercise technique    Person(s) Educated Patient    Methods Explanation;Demonstration;Tactile cues;Verbal cues    Comprehension Verbalized understanding;Returned demonstration;Verbal cues required;Tactile cues required            PT Short Term Goals - 09/23/20 1445      PT SHORT TERM GOAL #1   Title Patient will be independent in home exercise program to improve strength/mobility and decrease pain for better functional independence with ADLs.  Baseline 5/26: HEP next session 7/14; HEP compliant    Time 2    Period Weeks    Status Achieved    Target Date 05/28/20      PT SHORT TERM GOAL #2   Title Patient will report a worst pain of 7/10 on VAS in head to improve tolerance with ADLs and reduced symptoms with activities.    Baseline 5/26: 9/10 7/14: 7/10 9/14: 6/10    Time 2    Period Weeks    Status Achieved    Target Date 05/28/20      PT SHORT TERM GOAL #3   Title Patient will report a worst pain of 6/10 on VAS in thoracic and lumbar spine to improve tolerance with ADLs and reduced symptoms with activities.    Baseline 6/29: 8/10 pain 7/14: 8/10 8/20: thoracic 4/10 lumbar 8/10 9/14: thoracic 7/10 low back 8/10 10/5: 4/10 thoracic, 4/10 lumbar    Time 2    Period Weeks    Status Achieved    Target Date 09/16/20             PT Long Term Goals - 09/23/20 1441      PT LONG TERM GOAL #1   Title Patient will report a worst pain of 3/10 on VAS in her head/migraines to improve tolerance with ADLs and reduced symptoms with activities    Baseline 5/26: 9/10 7/14: 7/10 8/20: 7/10 9/14: 6/10 10/5: 4/10    Time 8    Period Weeks    Status Partially Met    Target Date 10/28/20      PT LONG TERM GOAL #2   Title Patient will increase FOTO score to equal to or greater than   45/100  to demonstrate  statistically significant improvement in mobility and quality of life.    Baseline 5/26: 44 with risk adjusted 33/100 7/14: 38.7% 8/20: 43% 9/14: 42% 10/5: 59%    Time 8    Period Weeks    Status Achieved    Target Date 10/28/20      PT LONG TERM GOAL #3   Title Patient will reduce dizziness handicap inventory score to <30, for less dizziness with ADLs and increased safety with home and work tasks.    Baseline 5/26: 40 7/14: 34% 8/20: 32% 9/14: 40% 10/5: 16%    Time 8    Period Weeks    Status Achieved    Target Date 10/28/20      PT LONG TERM GOAL #4   Title Patient will improve cervical rotation ROM to >/=60 degrees bilaterally to improve functional mobility and demonstrate improved muscle tissue length    Baseline 5/26: R 46 L 42 7/14: assess next session 7/21: R 51 L 49 8/20: L 60 R 51 9/14: WFL bilaterally 10/5: WFL bilaterally    Time 8    Period Weeks    Status Achieved    Target Date 10/28/20      PT LONG TERM GOAL #5   Title Patient will report a worst pain of 3/10 on VAS in  thoracic and lumbar spine    to improve tolerance with ADLs and reduced symptoms with activities and caregiver duties    Baseline 6/29: 8/10 7/13: 8/10 8/20: thoracic 4/10 lumbar 8/10 9/14: thoracic 4/10 lumbar 8/10 10/5: 4/10 thoracic, 4/10 lumbar    Time 8    Period Weeks    Status Partially Met    Target Date 10/28/20      PT LONG TERM  GOAL #6   Title Patient will tolerate sitting unsupported demonstrating erect sitting posture with minimal thoracic kyphosis for 15+ minutes with maximum of 5/10 back pain to demonstrate improved back extensor strength and improved sitting tolerance.    Baseline 6/29: painful and limited 7/14: can sit 15 minutes but has increasing forward head posture with prolonged hold 8/20: could drive 1 hour but had increase of pain to 8/10 9/14: able to perform with slight pain increase 10/5: able to perform with some back discomfort, but pt denies pain    Time 8    Period Weeks     Status Achieved    Target Date 10/28/20      PT LONG TERM GOAL #7   Title Patient will reduce modified Oswestry score to <20 as to demonstrate minimal disability with ADLs including improved sleeping tolerance, walking/sitting tolerance etc for better mobility with ADLs.    Baseline 6/29: 46% 7/13: 49% 8/20: 56% 9/14: 56% 10/5: 42%    Time 8    Period Weeks    Status Partially Met    Target Date 10/28/20                 Plan - 09/23/20 1450    Clinical Impression Statement Patient has made improvements in pain goals, achieving DHI goal and making significant gains in MODI scores. Achieved FOTO goal and notes decreased pain levels in head and back. Discussed final week in skilled PT to review HEP for correct mechanics, then begin [facility] training for maintenance of gains made in PT. Patient's condition has the potential to improve in response to therapy. Maximum improvement is yet to be obtained. The anticipated improvement is attainable and reasonable in a generally predictable time. Patient will continue to benefit from skilled physical therapy to reduce pain, improve spinal mobility, and allow patient better quality of life.    Personal Factors and Comorbidities Age;Comorbidity 3+;Fitness;Finances;Past/Current Experience;Social Background;Time since onset of injury/illness/exacerbation;Other    Comorbidities aorta type A dissection (2011) with aortic valve replacement, bypass of R Coronary artery, hypertension, hyperlipidemia, CHF, pre-diabetes, AFIB, anxiety, depression, arthritis, GERD, atypical migraine, asthma, allergic rhinitis.    Examination-Activity Limitations Bend;Caring for Others;Carry;Dressing;Stairs;Squat;Sit;Reach Overhead;Locomotion Level;Lift;Stand;Toileting;Transfers    Examination-Participation Restrictions Church;Cleaning;Community Activity;Interpersonal Relationship;Driving;Laundry;Volunteer;Shop;Personal Finances;Meal Prep;Medication Management;Yard Work     Product manager    Rehab Potential Fair    Clinical Impairments Affecting Rehab Potential + CAD testing, history of AAA, anemia, anxiety, asthma, CAD, chronic combined ystolic heart failure, GERD, Marfan syndrome, OA, RA (+) good work Psychologist, forensic, motivated, age     PT Frequency 2x / week    PT Duration 8 weeks    PT Treatment/Interventions ADLs/Self Care Home Management;Aquatic Therapy;Vestibular;Vasopneumatic Device;Taping;Passive range of motion;Dry needling;Energy conservation;Manual techniques;Therapeutic exercise;Balance training;Neuromuscular re-education;Patient/family education;Therapeutic activities;Functional mobility training;Stair training;Gait training;Electrical Stimulation;Iontophoresis 66m/ml Dexamethasone;Moist Heat;Traction;Ultrasound;Cryotherapy;Canalith Repostioning;Joint Manipulations;Spinal Manipulations    PT Next Visit Plan back interventions; posture    PT Home Exercise Plan Educated in seated Lev Scap and U/T stretches for HEP, as well as scap pinches and chin tucks.    Consulted and Agree with Plan of Care Patient           Patient will benefit from skilled therapeutic intervention in order to improve the following deficits and impairments:  Cardiopulmonary status limiting activity, Decreased activity tolerance, Decreased endurance, Decreased mobility, Decreased range of motion, Decreased strength, Difficulty walking, Dizziness, Hypomobility, Impaired flexibility, Impaired perceived functional ability, Increased muscle spasms, Impaired UE functional use, Impaired vision/preception, Obesity, Postural dysfunction, Improper body  mechanics, Pain, Abnormal gait  Visit Diagnosis: Cervicalgia  Chronic low back pain, unspecified back pain laterality, unspecified whether sciatica present  Abnormal posture  Pain in thoracic spine  Chronic midline low back pain, unspecified whether sciatica present     Problem List Patient  Active Problem List   Diagnosis Date Noted  . Acute bronchitis with bronchospasm 05/28/2020  . Diarrhea 04/11/2020  . Epigastric pain 02/19/2020  . Dark stools 02/19/2020  . PAF (paroxysmal atrial fibrillation) ([place]) 02/05/2019  . Shortness of breath 01/27/2019  . Irregular surface of cornea 12/25/2018  . HPV (human papilloma virus) infection 12/01/2016  . Screening mammogram, encounter for 11/29/2016  . Estrogen deficiency 11/29/2016  . Encounter for routine gynecological examination 11/29/2016  . Grade III hemorrhoids 10/06/2016  . Tachycardia 08/14/2016  . Hemorrhoids 08/13/2016  . Atypical migraine 08/03/2016  . CAD (coronary artery disease)   . AAA (abdominal aortic aneurysm) ([place])   . Chronic combined systolic (congestive) and diastolic (congestive) heart failure ([place])   . Prediabetes 02/11/2016  . Generalized anxiety disorder 12/08/2015  . Panic attacks 07/28/2015  . Sensorineural hearing loss 03/27/2013  . Vitamin D deficiency 07/03/2012  . H/O aortic dissection 05/12/2012  . Class 2 obesity due to excess calories with body mass index (BMI) of 35.0 to 35.9 in adult 11/21/2011  . Obesity (BMI 30.0-34.9) 11/21/2011  . Routine general medical examination at a health care facility 09/20/2011  . S/P AVR (aortic valve replacement) 01/07/2011  . CORONARY ARTERY BYPASS GRAFT, HX OF 01/07/2011  . Arthritis 12/20/2010  . Back pain 12/15/2010  . Dissection of thoracic aorta ([place]) 12/11/2010  . H/O dissecting abdominal aortic aneurysm repair 08/24/2010  . IRRITABLE BOWEL SYNDROME 09/09/2009  . Obstructive sleep apnea 08/04/2009  . External hemorrhoid 06/26/2009  . Osteoporosis 01/09/2009  . Hyperlipidemia 07/26/2007  . Depression with anxiety 07/26/2007  . Essential hypertension 07/26/2007  . Allergic rhinitis 07/26/2007  . Asthma 07/26/2007  . GERD 07/26/2007  . Osteoarthritis 07/26/2007  . Ocular migraine 07/26/2007  . Mild intermittent asthma without complication  99/27/8004   Tonny Bollman, SPT  This entire session was performed under direct supervision and direction of a licensed therapist/therapist assistant . I have personally read, edited and approve of the note as written.  Janna Arch, PT, DPT   09/23/2020, 2:51 PM  [place] MAIN [facility] SERVICES [address] [place], [place], [address] Phone: [phone]   Fax:  [phone]  Name: Rachel Torres MRN: [id number] Date of Birth: May 14, 1956

## 2020-09-26 ENCOUNTER — Ambulatory Visit: Payer: Medicare HMO

## 2020-09-26 ENCOUNTER — Other Ambulatory Visit: Payer: Self-pay

## 2020-09-26 DIAGNOSIS — M546 Pain in thoracic spine: Secondary | ICD-10-CM | POA: Diagnosis not present

## 2020-09-26 DIAGNOSIS — R293 Abnormal posture: Secondary | ICD-10-CM | POA: Diagnosis not present

## 2020-09-26 DIAGNOSIS — G8929 Other chronic pain: Secondary | ICD-10-CM | POA: Diagnosis not present

## 2020-09-26 DIAGNOSIS — M542 Cervicalgia: Secondary | ICD-10-CM

## 2020-09-26 DIAGNOSIS — M545 Low back pain, unspecified: Secondary | ICD-10-CM

## 2020-09-26 NOTE — Therapy (Signed)
[place] MAIN [facility] SERVICES [address] [place], [place], [address] Phone: [phone]   Fax:  [phone]  Physical Therapy Treatment  Patient Details  Name: Rachel Torres MRN: [id number] Date of Birth: Sep 22, 1956 Referring Provider (PT): Glori Bickers, Wynelle Fanny MD   Encounter Date: 09/26/2020   PT End of Session - 09/26/20 0953    Visit Number 31    Number of Visits 41    Date for PT Re-Evaluation 10/28/20    Authorization Type 1/10 PN 09/23/20    Authorization Time Period back pain added to POC on 06/17/20    PT Start Time 1000    PT Stop Time 1044    PT Time Calculation (min) 44 min    Activity Tolerance Patient tolerated treatment well    Behavior During Therapy [facility] for tasks assessed/performed           Past Medical History:  Diagnosis Date  . AAA (abdominal aortic aneurysm) ([place])    a. Chronic w/o evidence of aneurysmal dil on CTA 01/2016.  Marland Kitchen Alcohol abuse   . Allergy   . Anemia   . Anxiety   . CAD (coronary artery disease)    a. 08/2010 s/p CABG x 1 (VG->RCA) @ time of Ao dissection repair; b. 01/2016 Lexiscan MV: mid antsept/apical defect w/ ? peri-infarct ischemia-->likely attenuation-->Med Rx.  . Chewing difficulty   . Chronic combined systolic (congestive) and diastolic (congestive) heart failure ([place])    a. 2012 EF 30-35%; b. 04/2015 EF 40-45%; c. 01/2016 Echo: Ef 55-65%, nl AoV bioprosthesis, nl RV, nl PASP.  Marland Kitchen Constipation   . Depression   . Edema of both lower extremities   . Gallbladder sludge   . GERD (gastroesophageal reflux disease)   . Heart failure ([place])   . Heart valve problem   . History of Bicuspid Aortic Valve    a. 08/2010 s/p AVR @ time of Ao dissection repair; b. 01/2016 Echo: Ef 55-65%, nl AoV bioprosthesis, nl RV, nl PASP.  Marland Kitchen Hx of repair of dissecting thoracic aortic aneurysm, [place] type A    a. 08/2010 s/p repair with AVR and VG->RCA; b. 01/2016 CTA: stable appearance of Asc Thoracic Aortic graft.  Opacification of flase lumen of chronic abd Ao dissection w/ retrograde flow through lumbar arteries. No aneurysmal dil of flase lumen.  . Hyperlipidemia   . Hypertensive heart disease   . IBS (irritable bowel syndrome)   . Internal hemorrhoids   . Kidney problem   . Marfan syndrome    pt denies  . Migraine   . Obstructive sleep apnea   . Osteoarthritis   . Osteoporosis   . Palpitations   . Pneumonia   . RA (rheumatoid arthritis) ([place])    a. Followed by Dr. [name]    Past Surgical History:  Procedure Laterality Date  . ABDOMINAL AORTIC ANEURYSM REPAIR    . CARDIAC CATHETERIZATION  2001  . [place]  . CHOLECYSTECTOMY    . COLONOSCOPY    . ESOPHAGOGASTRODUODENOSCOPY (EGD) WITH PROPOFOL N/A 05/08/2020   Procedure: ESOPHAGOGASTRODUODENOSCOPY (EGD) WITH PROPOFOL;  Surgeon: Virgel Manifold, MD;  Location: [facility] ENDOSCOPY;  Service: Endoscopy;  Laterality: N/A;  . FRACTURE SURGERY Right    shoulder replacement  . HEMORRHOID BANDING    . ORIF DISTAL RADIUS FRACTURE Left   . UPPER GASTROINTESTINAL ENDOSCOPY      There were no vitals filed for this visit.   Subjective Assessment - 09/26/20 0923  Subjective Patient is compliant with HEP with no pain noted. Denies any migraines since last session. PT tells patient to bring HEP next session for review prior to d/c.    Pertinent History Patient has been seen by this therapist in the past for back related injuries/pain. Was last seen by this author in December of 2019. Since discharge patient has had COVID and an upper GI due to stomach issues. PMH includes aorta type A dissection (2011) with aortic valve replacement, bypass of R Coronary artery, hypertension, hyperlipidemia, CHF, pre-diabetes, AFIB, anxiety, depression, arthritis, GERD, atypical migraine, asthma, allergic rhinitis. 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 Does now have help with aide.    Limitations  Sitting;Reading;Lifting;Standing;Walking;Writing;Other (comment);House hold activities    How long can you sit comfortably? 20 mins before whole body discomfort    How long can you stand comfortably? 5 mins  or less: lower back    How long can you walk comfortably? 2 minutes    Patient Stated Goals to decrease pain and migraines    Currently in Pain? No/denies    Pain Onset More than a month ago           Prone:  CPAs and UPAs grade II-IV to thoracic and lumbar spine, 5x 12 seconds each level. Hypomobility throughout with reduction with repetition.    J mobilization to upper thoracic/lower cervical for reduced thoracic kyphosis. 3x30 seconds  Prone I, Y, T to strengthen scapular musculature. 10x each; on swiss ball with functional core activation and cues for hand/arm placement       Supine:  Cervical side bend 2x 30 seconds with overpressure at occiput and glenohumeral joint for optimal muscle tissue lengthening Cervical rotation. 2x 30 seconds with overpressure at occiput for maximal stretch Suboccipital release 3x30 second holds   Hooklying core activation with green physioball, alternating arm raises. 12x each arm.   Isometric 90/90 with green physioball 10x with 5 second holds.      Trigger point/soft tissue release with movement to improve soft tissue limited muscle tissue length of upper trap, levator scap, cervical paraspinals.x 8 minutes     Quadruped: Bird dog with cues to maintain neutral spine and core activation. 6x each leg. Intervention ended due to patient c/o pain in R shoulder.   Pt educated throughout session about proper posture and technique with exercises. Improved exercise technique, movement   HEP Given: Access Code: [place] URL: [email or web address]  Date: 09/26/2020  Prepared by: Janna Arch   Exercises  Prone Lower Trapezius Strengthening on Swiss Ball - 1 x daily - 7 x weekly - 2 sets - 12 reps - 5 hold  Prone Middle  Trapezius Strengthening on Swiss Ball - 1 x daily - 7 x weekly - 2 sets - 12 reps - 5 hold  Prone Shoulder Extension on Swiss Ball - 1 x daily - 7 x weekly - 2 sets - 12 reps - 5 hold                         PT Education - 09/26/20 0953    Education Details body mechanics, exercise technique    Person(s) Educated Patient    Methods Explanation;Demonstration;Tactile cues;Verbal cues    Comprehension Verbalized understanding;Returned demonstration;Verbal cues required;Tactile cues required            PT Short Term Goals - 09/23/20 1445      PT SHORT TERM GOAL #1   Title Patient will be  independent in home exercise program to improve strength/mobility and decrease pain for better functional independence with ADLs.    Baseline 5/26: HEP next session 7/14; HEP compliant    Time 2    Period Weeks    Status Achieved    Target Date 05/28/20      PT SHORT TERM GOAL #2   Title Patient will report a worst pain of 7/10 on VAS in head to improve tolerance with ADLs and reduced symptoms with activities.    Baseline 5/26: 9/10 7/14: 7/10 9/14: 6/10    Time 2    Period Weeks    Status Achieved    Target Date 05/28/20      PT SHORT TERM GOAL #3   Title Patient will report a worst pain of 6/10 on VAS in thoracic and lumbar spine to improve tolerance with ADLs and reduced symptoms with activities.    Baseline 6/29: 8/10 pain 7/14: 8/10 8/20: thoracic 4/10 lumbar 8/10 9/14: thoracic 7/10 low back 8/10 10/5: 4/10 thoracic, 4/10 lumbar    Time 2    Period Weeks    Status Achieved    Target Date 09/16/20             PT Long Term Goals - 09/23/20 1441      PT LONG TERM GOAL #1   Title Patient will report a worst pain of 3/10 on VAS in her head/migraines to improve tolerance with ADLs and reduced symptoms with activities    Baseline 5/26: 9/10 7/14: 7/10 8/20: 7/10 9/14: 6/10 10/5: 4/10    Time 8    Period Weeks    Status Partially Met    Target Date 10/28/20       PT LONG TERM GOAL #2   Title Patient will increase FOTO score to equal to or greater than   45/100  to demonstrate statistically significant improvement in mobility and quality of life.    Baseline 5/26: 44 with risk adjusted 33/100 7/14: 38.7% 8/20: 43% 9/14: 42% 10/5: 59%    Time 8    Period Weeks    Status Achieved    Target Date 10/28/20      PT LONG TERM GOAL #3   Title Patient will reduce dizziness handicap inventory score to <30, for less dizziness with ADLs and increased safety with home and work tasks.    Baseline 5/26: 40 7/14: 34% 8/20: 32% 9/14: 40% 10/5: 16%    Time 8    Period Weeks    Status Achieved    Target Date 10/28/20      PT LONG TERM GOAL #4   Title Patient will improve cervical rotation ROM to >/=60 degrees bilaterally to improve functional mobility and demonstrate improved muscle tissue length    Baseline 5/26: R 46 L 42 7/14: assess next session 7/21: R 51 L 49 8/20: L 60 R 51 9/14: WFL bilaterally 10/5: WFL bilaterally    Time 8    Period Weeks    Status Achieved    Target Date 10/28/20      PT LONG TERM GOAL #5   Title Patient will report a worst pain of 3/10 on VAS in  thoracic and lumbar spine    to improve tolerance with ADLs and reduced symptoms with activities and caregiver duties    Baseline 6/29: 8/10 7/13: 8/10 8/20: thoracic 4/10 lumbar 8/10 9/14: thoracic 4/10 lumbar 8/10 10/5: 4/10 thoracic, 4/10 lumbar    Time 8    Period Weeks  Status Partially Met    Target Date 10/28/20      PT LONG TERM GOAL #6   Title Patient will tolerate sitting unsupported demonstrating erect sitting posture with minimal thoracic kyphosis for 15+ minutes with maximum of 5/10 back pain to demonstrate improved back extensor strength and improved sitting tolerance.    Baseline 6/29: painful and limited 7/14: can sit 15 minutes but has increasing forward head posture with prolonged hold 8/20: could drive 1 hour but had increase of pain to 8/10 9/14: able to perform with  slight pain increase 10/5: able to perform with some back discomfort, but pt denies pain    Time 8    Period Weeks    Status Achieved    Target Date 10/28/20      PT LONG TERM GOAL #7   Title Patient will reduce modified Oswestry score to <20 as to demonstrate minimal disability with ADLs including improved sleeping tolerance, walking/sitting tolerance etc for better mobility with ADLs.    Baseline 6/29: 46% 7/13: 49% 8/20: 56% 9/14: 56% 10/5: 42%    Time 8    Period Weeks    Status Partially Met    Target Date 10/28/20                 Plan - 09/26/20 1039    Clinical Impression Statement Patient presents with less muscular guarding this session in both cervical and lumbar regions, but still has stiffness. Patient demonstrating improved core activation and ability to sustain contraction. Patient will continue to benefit from skilled physical therapy to reduce pain, improve spinal mobility, and allow patient better quality of life.    Personal Factors and Comorbidities Age;Comorbidity 3+;Fitness;Finances;Past/Current Experience;Social Background;Time since onset of injury/illness/exacerbation;Other    Comorbidities aorta type A dissection (2011) with aortic valve replacement, bypass of R Coronary artery, hypertension, hyperlipidemia, CHF, pre-diabetes, AFIB, anxiety, depression, arthritis, GERD, atypical migraine, asthma, allergic rhinitis.    Examination-Activity Limitations Bend;Caring for Others;Carry;Dressing;Stairs;Squat;Sit;Reach Overhead;Locomotion Level;Lift;Stand;Toileting;Transfers    Examination-Participation Restrictions Church;Cleaning;Community Activity;Interpersonal Relationship;Driving;Laundry;Volunteer;Shop;Personal Finances;Meal Prep;Medication Management;Yard Work    Product manager    Rehab Potential Fair    Clinical Impairments Affecting Rehab Potential + CAD testing, history of AAA, anemia, anxiety, asthma, CAD,  chronic combined ystolic heart failure, GERD, Marfan syndrome, OA, RA (+) good work Psychologist, forensic, motivated, age     PT Frequency 2x / week    PT Duration 8 weeks    PT Treatment/Interventions ADLs/Self Care Home Management;Aquatic Therapy;Vestibular;Vasopneumatic Device;Taping;Passive range of motion;Dry needling;Energy conservation;Manual techniques;Therapeutic exercise;Balance training;Neuromuscular re-education;Patient/family education;Therapeutic activities;Functional mobility training;Stair training;Gait training;Electrical Stimulation;Iontophoresis 17m/ml Dexamethasone;Moist Heat;Traction;Ultrasound;Cryotherapy;Canalith Repostioning;Joint Manipulations;Spinal Manipulations    PT Next Visit Plan back interventions; posture    PT Home Exercise Plan Educated in seated Lev Scap and U/T stretches for HEP, as well as scap pinches and chin tucks.    Consulted and Agree with Plan of Care Patient           Patient will benefit from skilled therapeutic intervention in order to improve the following deficits and impairments:  Cardiopulmonary status limiting activity, Decreased activity tolerance, Decreased endurance, Decreased mobility, Decreased range of motion, Decreased strength, Difficulty walking, Dizziness, Hypomobility, Impaired flexibility, Impaired perceived functional ability, Increased muscle spasms, Impaired UE functional use, Impaired vision/preception, Obesity, Postural dysfunction, Improper body mechanics, Pain, Abnormal gait  Visit Diagnosis: Cervicalgia  Chronic low back pain, unspecified back pain laterality, unspecified whether sciatica present  Abnormal posture  Pain in thoracic spine  Chronic midline low back pain, unspecified whether  sciatica present     Problem List Patient Active Problem List   Diagnosis Date Noted  . Acute bronchitis with bronchospasm 05/28/2020  . Diarrhea 04/11/2020  . Epigastric pain 02/19/2020  . Dark stools 02/19/2020  . PAF (paroxysmal atrial  fibrillation) ([place]) 02/05/2019  . Shortness of breath 01/27/2019  . Irregular surface of cornea 12/25/2018  . HPV (human papilloma virus) infection 12/01/2016  . Screening mammogram, encounter for 11/29/2016  . Estrogen deficiency 11/29/2016  . Encounter for routine gynecological examination 11/29/2016  . Grade III hemorrhoids 10/06/2016  . Tachycardia 08/14/2016  . Hemorrhoids 08/13/2016  . Atypical migraine 08/03/2016  . CAD (coronary artery disease)   . AAA (abdominal aortic aneurysm) ([place])   . Chronic combined systolic (congestive) and diastolic (congestive) heart failure ([place])   . Prediabetes 02/11/2016  . Generalized anxiety disorder 12/08/2015  . Panic attacks 07/28/2015  . Sensorineural hearing loss 03/27/2013  . Vitamin D deficiency 07/03/2012  . H/O aortic dissection 05/12/2012  . Class 2 obesity due to excess calories with body mass index (BMI) of 35.0 to 35.9 in adult 11/21/2011  . Obesity (BMI 30.0-34.9) 11/21/2011  . Routine general medical examination at a health care facility 09/20/2011  . S/P AVR (aortic valve replacement) 01/07/2011  . CORONARY ARTERY BYPASS GRAFT, HX OF 01/07/2011  . Arthritis 12/20/2010  . Back pain 12/15/2010  . Dissection of thoracic aorta ([place]) 12/11/2010  . H/O dissecting abdominal aortic aneurysm repair 08/24/2010  . IRRITABLE BOWEL SYNDROME 09/09/2009  . Obstructive sleep apnea 08/04/2009  . External hemorrhoid 06/26/2009  . Osteoporosis 01/09/2009  . Hyperlipidemia 07/26/2007  . Depression with anxiety 07/26/2007  . Essential hypertension 07/26/2007  . Allergic rhinitis 07/26/2007  . Asthma 07/26/2007  . GERD 07/26/2007  . Osteoarthritis 07/26/2007  . Ocular migraine 07/26/2007  . Mild intermittent asthma without complication 20/94/7096   Tonny Bollman, SPT  This entire session was performed under direct supervision and direction of a licensed therapist/therapist assistant . I have personally read, edited and approve of the  note as written.  Janna Arch, PT, DPT   09/26/2020, 10:41 AM  [place] MAIN [facility] SERVICES [address] [place], [place], [address] Phone: [phone]   Fax:  [phone]  Name: Rachel Torres MRN: [id number] Date of Birth: 02/09/1956

## 2020-09-30 ENCOUNTER — Other Ambulatory Visit: Payer: Self-pay

## 2020-09-30 ENCOUNTER — Ambulatory Visit: Payer: Medicare HMO

## 2020-09-30 DIAGNOSIS — R293 Abnormal posture: Secondary | ICD-10-CM

## 2020-09-30 DIAGNOSIS — G8929 Other chronic pain: Secondary | ICD-10-CM

## 2020-09-30 DIAGNOSIS — M546 Pain in thoracic spine: Secondary | ICD-10-CM

## 2020-09-30 DIAGNOSIS — M545 Low back pain, unspecified: Secondary | ICD-10-CM

## 2020-09-30 DIAGNOSIS — M542 Cervicalgia: Secondary | ICD-10-CM

## 2020-09-30 NOTE — Therapy (Signed)
[place] MAIN [facility] SERVICES [address] [place], [place], [address] Phone: [phone]   Fax:  [phone]  Physical Therapy Treatment / Discharge  Patient Details  Name: Rachel Torres MRN: [id number] Date of Birth: 04-08-1956 Referring Provider (PT): Loura Pardon A MD   Encounter Date: 09/30/2020   PT End of Session - 09/30/20 1525    Visit Number 32    Number of Visits 41    Date for PT Re-Evaluation 10/28/20    Authorization Type 2/10 PN 09/23/20    Authorization Time Period back pain added to POC on 06/17/20    PT Start Time 1515    PT Stop Time 1559    PT Time Calculation (min) 44 min    Activity Tolerance Patient tolerated treatment well    Behavior During Therapy [facility] for tasks assessed/performed           Past Medical History:  Diagnosis Date  . AAA (abdominal aortic aneurysm) ([place])    a. Chronic w/o evidence of aneurysmal dil on CTA 01/2016.  Marland Kitchen Alcohol abuse   . Allergy   . Anemia   . Anxiety   . CAD (coronary artery disease)    a. 08/2010 s/p CABG x 1 (VG->RCA) @ time of Ao dissection repair; b. 01/2016 Lexiscan MV: mid antsept/apical defect w/ ? peri-infarct ischemia-->likely attenuation-->Med Rx.  . Chewing difficulty   . Chronic combined systolic (congestive) and diastolic (congestive) heart failure ([place])    a. 2012 EF 30-35%; b. 04/2015 EF 40-45%; c. 01/2016 Echo: Ef 55-65%, nl AoV bioprosthesis, nl RV, nl PASP.  Marland Kitchen Constipation   . Depression   . Edema of both lower extremities   . Gallbladder sludge   . GERD (gastroesophageal reflux disease)   . Heart failure ([place])   . Heart valve problem   . History of Bicuspid Aortic Valve    a. 08/2010 s/p AVR @ time of Ao dissection repair; b. 01/2016 Echo: Ef 55-65%, nl AoV bioprosthesis, nl RV, nl PASP.  Marland Kitchen Hx of repair of dissecting thoracic aortic aneurysm, [place] type A    a. 08/2010 s/p repair with AVR and VG->RCA; b. 01/2016 CTA: stable appearance of Asc Thoracic  Aortic graft. Opacification of flase lumen of chronic abd Ao dissection w/ retrograde flow through lumbar arteries. No aneurysmal dil of flase lumen.  . Hyperlipidemia   . Hypertensive heart disease   . IBS (irritable bowel syndrome)   . Internal hemorrhoids   . Kidney problem   . Marfan syndrome    pt denies  . Migraine   . Obstructive sleep apnea   . Osteoarthritis   . Osteoporosis   . Palpitations   . Pneumonia   . RA (rheumatoid arthritis) ([employer])    a. Followed by Dr. [name]    Past Surgical History:  Procedure Laterality Date  . ABDOMINAL AORTIC ANEURYSM REPAIR    . CARDIAC CATHETERIZATION  2001  . [place]  . CHOLECYSTECTOMY    . COLONOSCOPY    . ESOPHAGOGASTRODUODENOSCOPY (EGD) WITH PROPOFOL N/A 05/08/2020   Procedure: ESOPHAGOGASTRODUODENOSCOPY (EGD) WITH PROPOFOL;  Surgeon: Virgel Manifold, MD;  Location: [facility] ENDOSCOPY;  Service: Endoscopy;  Laterality: N/A;  . FRACTURE SURGERY Right    shoulder replacement  . HEMORRHOID BANDING    . ORIF DISTAL RADIUS FRACTURE Left   . UPPER GASTROINTESTINAL ENDOSCOPY      There were no vitals filed for this visit.   Subjective Assessment -  09/30/20 1515    Subjective Patient brings HEP this session for review. Patient notes one headache since last session which was managed via medication. Denies falls or LOB since last session.    Pertinent History Patient has been seen by this therapist in the past for back related injuries/pain. Was last seen by this author in December of 2019. Since discharge patient has had COVID and an upper GI due to stomach issues. PMH includes aorta type A dissection (2011) with aortic valve replacement, bypass of R Coronary artery, hypertension, hyperlipidemia, CHF, pre-diabetes, AFIB, anxiety, depression, arthritis, GERD, atypical migraine, asthma, allergic rhinitis. 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 Does now have help with aide.    Limitations  Sitting;Reading;Lifting;Standing;Walking;Writing;Other (comment);House hold activities    How long can you sit comfortably? 20 mins before whole body discomfort    How long can you stand comfortably? 5 mins  or less: lower back    How long can you walk comfortably? 2 minutes    Patient Stated Goals to decrease pain and migraines    Currently in Pain? No/denies    Pain Onset More than a month ago          Goals: FOTO: 62% VAS back: thoracic 4/10, lumbar 4/10 VAS head/migraines: 5/10  Reviewed HEP for correct execution and to field any patient questions. Patient demonstrates verbal understanding of HEP.  Treatment:  Prone:  CPAs and UPAs grade II-IV to thoracic and lumbar spine, 5x 12 seconds each level. Hypomobility throughout with reduction with repetition.    J mobilization to upper thoracic/lower cervical for reduced thoracic kyphosis. 3x30 seconds    Supine:  Cervical side bend 2x 30 seconds with overpressure at occiput and glenohumeral joint for optimal muscle tissue lengthening Cervical rotation. 2x 30 seconds with overpressure at occiput for maximal stretch Suboccipital release 3x30 second holds   Hooklying core activation with swiss ball and alternating arm raises. PT cues to maintain activation throughout exercise. 10 arm raises each side (20x total)                           PT Education - 09/30/20 1524    Education provided Yes    Education Details goals, exercise technique, HEP review, discharge    Person(s) Educated Patient    Methods Explanation;Demonstration;Tactile cues;Verbal cues    Comprehension Verbalized understanding;Returned demonstration;Verbal cues required;Tactile cues required            PT Short Term Goals - 09/30/20 1605      PT SHORT TERM GOAL #1   Title Patient will be independent in home exercise program to improve strength/mobility and decrease pain for better functional independence with ADLs.    Baseline 5/26:  HEP next session 7/14; HEP compliant    Time 2    Period Weeks    Status Achieved    Target Date 05/28/20      PT SHORT TERM GOAL #2   Title Patient will report a worst pain of 7/10 on VAS in head to improve tolerance with ADLs and reduced symptoms with activities.    Baseline 5/26: 9/10 7/14: 7/10 9/14: 6/10 10/12: 5/10    Time 2    Period Weeks    Status Achieved    Target Date 05/28/20      PT SHORT TERM GOAL #3   Title Patient will report a worst pain of 6/10 on VAS in thoracic and lumbar spine to improve tolerance with  ADLs and reduced symptoms with activities.    Baseline 6/29: 8/10 pain 7/14: 8/10 8/20: thoracic 4/10 lumbar 8/10 9/14: thoracic 7/10 low back 8/10 10/5: 4/10 thoracic, 4/10 lumbar 10/12: 4/10 thoracic, 4/10 lumbar    Time 2    Period Weeks    Status Achieved    Target Date 09/16/20             PT Long Term Goals - 09/30/20 1606      PT LONG TERM GOAL #1   Title Patient will report a worst pain of 3/10 on VAS in her head/migraines to improve tolerance with ADLs and reduced symptoms with activities    Baseline 5/26: 9/10 7/14: 7/10 8/20: 7/10 9/14: 6/10 10/5: 4/10 10/12: 5/10    Time 8    Period Weeks    Status Not Met    Target Date 10/28/20      PT LONG TERM GOAL #2   Title Patient will increase FOTO score to equal to or greater than   45/100  to demonstrate statistically significant improvement in mobility and quality of life.    Baseline 5/26: 44 with risk adjusted 33/100 7/14: 38.7% 8/20: 43% 9/14: 42% 10/5: 59% 10/12: 62%    Time 8    Period Weeks    Status Achieved    Target Date 10/28/20      PT LONG TERM GOAL #3   Title Patient will reduce dizziness handicap inventory score to <30, for less dizziness with ADLs and increased safety with home and work tasks.    Baseline 5/26: 40 7/14: 34% 8/20: 32% 9/14: 40% 10/5: 16%    Time 8    Period Weeks    Status Achieved    Target Date 10/28/20      PT LONG TERM GOAL #4   Title Patient will  improve cervical rotation ROM to >/=60 degrees bilaterally to improve functional mobility and demonstrate improved muscle tissue length    Baseline 5/26: R 46 L 42 7/14: assess next session 7/21: R 51 L 49 8/20: L 60 R 51 9/14: WFL bilaterally 10/5: WFL bilaterally    Time 8    Period Weeks    Status Achieved    Target Date 10/28/20      PT LONG TERM GOAL #5   Title Patient will report a worst pain of 3/10 on VAS in  thoracic and lumbar spine    to improve tolerance with ADLs and reduced symptoms with activities and caregiver duties    Baseline 6/29: 8/10 7/13: 8/10 8/20: thoracic 4/10 lumbar 8/10 9/14: thoracic 4/10 lumbar 8/10 10/5: 4/10 thoracic, 4/10 lumbar    Time 8    Period Weeks    Status Not Met    Target Date 10/28/20      PT LONG TERM GOAL #6   Title Patient will tolerate sitting unsupported demonstrating erect sitting posture with minimal thoracic kyphosis for 15+ minutes with maximum of 5/10 back pain to demonstrate improved back extensor strength and improved sitting tolerance.    Baseline 6/29: painful and limited 7/14: can sit 15 minutes but has increasing forward head posture with prolonged hold 8/20: could drive 1 hour but had increase of pain to 8/10 9/14: able to perform with slight pain increase 10/5: able to perform with some back discomfort, but pt denies pain    Time 8    Period Weeks    Status Achieved    Target Date 10/28/20      PT  LONG TERM GOAL #7   Title Patient will reduce modified Oswestry score to <20 as to demonstrate minimal disability with ADLs including improved sleeping tolerance, walking/sitting tolerance etc for better mobility with ADLs.    Baseline 6/29: 46% 7/13: 49% 8/20: 56% 9/14: 56% 10/5: 42%    Time 8    Period Weeks    Status Not Met    Target Date 10/28/20                 Plan - 09/30/20 1604    Clinical Impression Statement Patient presents with improved joint mobility in both thoracic and lumbar spine. Patient notes feeling  much better since initiating therapy, with less frequent migraines, pain, and muscle spasms noted. Patient demonstrates verbal understanding during HEP review as today is patient's last session. Patient is appropriate for discharge as she has met most goals. Patient plans to continue with cardiac rehabilitation to maintain gains from skilled PT. I will be happy to see this patient again in the future as needed.    Personal Factors and Comorbidities Age;Comorbidity 3+;Fitness;Finances;Past/Current Experience;Social Background;Time since onset of injury/illness/exacerbation;Other    Comorbidities aorta type A dissection (2011) with aortic valve replacement, bypass of R Coronary artery, hypertension, hyperlipidemia, CHF, pre-diabetes, AFIB, anxiety, depression, arthritis, GERD, atypical migraine, asthma, allergic rhinitis.    Examination-Activity Limitations Bend;Caring for Others;Carry;Dressing;Stairs;Squat;Sit;Reach Overhead;Locomotion Level;Lift;Stand;Toileting;Transfers    Examination-Participation Restrictions Church;Cleaning;Community Activity;Interpersonal Relationship;Driving;Laundry;Volunteer;Shop;Personal Finances;Meal Prep;Medication Management;Yard Work    Product manager    Rehab Potential Fair    Clinical Impairments Affecting Rehab Potential + CAD testing, history of AAA, anemia, anxiety, asthma, CAD, chronic combined ystolic heart failure, GERD, Marfan syndrome, OA, RA (+) good work Psychologist, forensic, motivated, age     PT Frequency 2x / week    PT Duration 8 weeks    PT Treatment/Interventions ADLs/Self Care Home Management;Aquatic Therapy;Vestibular;Vasopneumatic Device;Taping;Passive range of motion;Dry needling;Energy conservation;Manual techniques;Therapeutic exercise;Balance training;Neuromuscular re-education;Patient/family education;Therapeutic activities;Functional mobility training;Stair training;Gait training;Electrical Stimulation;Iontophoresis  39m/ml Dexamethasone;Moist Heat;Traction;Ultrasound;Cryotherapy;Canalith Repostioning;Joint Manipulations;Spinal Manipulations    PT Next Visit Plan back interventions; posture    PT Home Exercise Plan Educated in seated Lev Scap and U/T stretches for HEP, as well as scap pinches and chin tucks.    Consulted and Agree with Plan of Care Patient           Patient will benefit from skilled therapeutic intervention in order to improve the following deficits and impairments:  Cardiopulmonary status limiting activity, Decreased activity tolerance, Decreased endurance, Decreased mobility, Decreased range of motion, Decreased strength, Difficulty walking, Dizziness, Hypomobility, Impaired flexibility, Impaired perceived functional ability, Increased muscle spasms, Impaired UE functional use, Impaired vision/preception, Obesity, Postural dysfunction, Improper body mechanics, Pain, Abnormal gait  Visit Diagnosis: Cervicalgia  Chronic low back pain, unspecified back pain laterality, unspecified whether sciatica present  Abnormal posture  Pain in thoracic spine  Chronic midline low back pain, unspecified whether sciatica present     Problem List Patient Active Problem List   Diagnosis Date Noted  . Acute bronchitis with bronchospasm 05/28/2020  . Diarrhea 04/11/2020  . Epigastric pain 02/19/2020  . Dark stools 02/19/2020  . PAF (paroxysmal atrial fibrillation) ([place] 02/05/2019  . Shortness of breath 01/27/2019  . Irregular surface of cornea 12/25/2018  . HPV (human papilloma virus) infection 12/01/2016  . Screening mammogram, encounter for 11/29/2016  . Estrogen deficiency 11/29/2016  . Encounter for routine gynecological examination 11/29/2016  . Grade III hemorrhoids 10/06/2016  . Tachycardia 08/14/2016  . Hemorrhoids 08/13/2016  . Atypical migraine  08/03/2016  . CAD (coronary artery disease)   . AAA (abdominal aortic aneurysm) ([place])   . Chronic combined systolic (congestive) and  diastolic (congestive) heart failure ([place])   . Prediabetes 02/11/2016  . Generalized anxiety disorder 12/08/2015  . Panic attacks 07/28/2015  . Sensorineural hearing loss 03/27/2013  . Vitamin D deficiency 07/03/2012  . H/O aortic dissection 05/12/2012  . Class 2 obesity due to excess calories with body mass index (BMI) of 35.0 to 35.9 in adult 11/21/2011  . Obesity (BMI 30.0-34.9) 11/21/2011  . Routine general medical examination at a health care facility 09/20/2011  . S/P AVR (aortic valve replacement) 01/07/2011  . CORONARY ARTERY BYPASS GRAFT, HX OF 01/07/2011  . Arthritis 12/20/2010  . Back pain 12/15/2010  . Dissection of thoracic aorta ([place]) 12/11/2010  . H/O dissecting abdominal aortic aneurysm repair 08/24/2010  . IRRITABLE BOWEL SYNDROME 09/09/2009  . Obstructive sleep apnea 08/04/2009  . External hemorrhoid 06/26/2009  . Osteoporosis 01/09/2009  . Hyperlipidemia 07/26/2007  . Depression with anxiety 07/26/2007  . Essential hypertension 07/26/2007  . Allergic rhinitis 07/26/2007  . Asthma 07/26/2007  . GERD 07/26/2007  . Osteoarthritis 07/26/2007  . Ocular migraine 07/26/2007  . Mild intermittent asthma without complication 10/16/2535   Tonny Bollman, SPT This entire session was performed under direct supervision and direction of a licensed therapist/therapist assistant . I have personally read, edited and approve of the note as written.  Janna Arch, PT, DPT   09/30/2020, 4:30 PM  [place] MAIN [facility] SERVICES [address] [place], [place], [address] Phone: [phone]   Fax:  [phone]  Name: Rachel Torres MRN: [id number] Date of Birth: 1956/07/08

## 2020-10-01 ENCOUNTER — Encounter (INDEPENDENT_AMBULATORY_CARE_PROVIDER_SITE_OTHER): Payer: Self-pay | Admitting: Family Medicine

## 2020-10-01 ENCOUNTER — Ambulatory Visit (INDEPENDENT_AMBULATORY_CARE_PROVIDER_SITE_OTHER): Payer: Medicare HMO | Admitting: Family Medicine

## 2020-10-01 ENCOUNTER — Ambulatory Visit (INDEPENDENT_AMBULATORY_CARE_PROVIDER_SITE_OTHER): Payer: Medicare HMO | Admitting: Psychology

## 2020-10-01 VITALS — HR 72 | Temp 97.6°F | Ht 65.0 in | Wt 191.0 lb

## 2020-10-01 DIAGNOSIS — Z6831 Body mass index (BMI) 31.0-31.9, adult: Secondary | ICD-10-CM | POA: Diagnosis not present

## 2020-10-01 DIAGNOSIS — R7303 Prediabetes: Secondary | ICD-10-CM | POA: Diagnosis not present

## 2020-10-01 DIAGNOSIS — F419 Anxiety disorder, unspecified: Secondary | ICD-10-CM

## 2020-10-01 DIAGNOSIS — F4323 Adjustment disorder with mixed anxiety and depressed mood: Secondary | ICD-10-CM | POA: Diagnosis not present

## 2020-10-01 DIAGNOSIS — E669 Obesity, unspecified: Secondary | ICD-10-CM | POA: Diagnosis not present

## 2020-10-01 DIAGNOSIS — E66811 Obesity, class 1: Secondary | ICD-10-CM

## 2020-10-03 ENCOUNTER — Ambulatory Visit: Payer: Medicare HMO

## 2020-10-07 ENCOUNTER — Ambulatory Visit: Payer: Medicare HMO

## 2020-10-08 ENCOUNTER — Ambulatory Visit (INDEPENDENT_AMBULATORY_CARE_PROVIDER_SITE_OTHER): Payer: Medicare HMO | Admitting: Psychology

## 2020-10-08 DIAGNOSIS — F4323 Adjustment disorder with mixed anxiety and depressed mood: Secondary | ICD-10-CM | POA: Diagnosis not present

## 2020-10-13 NOTE — Progress Notes (Signed)
Chief Complaint:   OBESITY Rachel Torres is here to discuss her progress with her obesity treatment plan along with follow-up of her obesity related diagnoses. Rachel Torres is on following a lower carbohydrate, vegetable and lean protein rich diet plan and states she is following her eating plan approximately 90-95% of the time. Rachel Torres states she is doing core strengthening for 5-10 minutes 5 times per week.  Today's visit was #: 7 Starting weight: 213 lbs Starting date: 05/05/2020 Today's weight: 191 lbs Today's date: 10/01/2020 Total lbs lost to date: 22 Total lbs lost since last in-office visit: 2  Interim History: Rachel Torres continues to do well with weight loss on her Low carbohydrate plan. She has added core strengthening exercises and is planning on restarting cardio rehab program.  Subjective:   1. Pre-diabetes Rachel Torres is doing well with diet and weight loss. She has significantly reduced her simple carbohydrates.  2. Anxiety Rachel Torres is seeing her counselor weekly and appears to be doing well overall. Her eating dose not seem to affect her anxiety in any significant way.  Assessment/Plan:   1. Pre-diabetes Rachel Torres will continue to work on weight loss, diet, exercise, and decreasing simple carbohydrates to help decrease the risk of diabetes. We will plan to recheck labs in 4-6 weeks.  2. Anxiety Behavior modification techniques were discussed today to help Rachel Torres deal with her anxiety and emotional eating. She will continue to follow up with her counselor. Orders and follow up as documented in patient record.   3. Class 1 obesity with serious comorbidity and body mass index (BMI) of 31.0 to 31.9 in adult, unspecified obesity type Rachel Torres is currently in the action stage of change. As such, her goal is to continue with weight loss efforts. She has agreed to following a lower carbohydrate, vegetable and lean protein rich diet plan.   Exercise goals: As is.  Behavioral modification  strategies: increasing water intake.  Rachel Torres has agreed to follow-up with our clinic in 4 weeks. She was informed of the importance of frequent follow-up visits to maximize her success with intensive lifestyle modifications for her multiple health conditions.   Objective:   Pulse 72, temperature 97.6 F (36.4 C), height 5\' 5"  (1.651 m), weight 191 lb (86.6 kg), SpO2 97 %. Body mass index is 31.78 kg/m.  General: Cooperative, alert, well developed, in no acute distress. HEENT: Conjunctivae and lids unremarkable. Cardiovascular: Regular rhythm.  Lungs: Normal work of breathing. Neurologic: No focal deficits.   Lab Results  Component Value Date   CREATININE 0.91 07/23/2020   BUN 18 07/23/2020   NA 139 07/23/2020   K 4.6 07/23/2020   CL 100 07/23/2020   CO2 25 07/23/2020   Lab Results  Component Value Date   ALT 20 07/23/2020   AST 22 07/23/2020   ALKPHOS 82 07/23/2020   BILITOT 0.3 07/23/2020   Lab Results  Component Value Date   HGBA1C 5.6 07/23/2020   HGBA1C 5.9 (H) 05/05/2020   HGBA1C 6.0 06/07/2019   HGBA1C 6.1 06/26/2018   HGBA1C 6.0 11/24/2017   Lab Results  Component Value Date   INSULIN 22.5 07/23/2020   INSULIN 25.2 (H) 05/05/2020   Lab Results  Component Value Date   TSH 1.040 05/05/2020   Lab Results  Component Value Date   CHOL 198 07/23/2020   HDL 52 07/23/2020   LDLCALC 122 (H) 07/23/2020   LDLDIRECT 189.4 10/13/2011   TRIG 136 07/23/2020   CHOLHDL 3 06/07/2019   Lab Results  Component  Value Date   WBC 5.8 02/19/2020   HGB 13.0 02/19/2020   HCT 38.5 02/19/2020   MCV 95.4 02/19/2020   PLT 284.0 02/19/2020   Lab Results  Component Value Date   FERRITIN 105.0 12/11/2010   Attestation Statements:   Reviewed by clinician on day of visit: allergies, medications, problem list, medical history, surgical history, family history, social history, and previous encounter notes.  Time spent on visit including pre-visit chart review and  post-visit care and charting was 30 minutes.    I, Trixie Dredge, am acting as transcriptionist for [name], MD.  I have reviewed the above documentation for accuracy and completeness, and I agree with the above. -  [name], MD

## 2020-10-14 ENCOUNTER — Ambulatory Visit (INDEPENDENT_AMBULATORY_CARE_PROVIDER_SITE_OTHER): Payer: Medicare HMO | Admitting: Psychology

## 2020-10-14 DIAGNOSIS — F4323 Adjustment disorder with mixed anxiety and depressed mood: Secondary | ICD-10-CM

## 2020-10-16 ENCOUNTER — Telehealth: Payer: Self-pay

## 2020-10-16 NOTE — Telephone Encounter (Signed)
Monitor her condition carefully -have someone check in with her.  I do recommend poison control but of course that is up to her.   If able- keep track of BP and pulse

## 2020-10-16 NOTE — Telephone Encounter (Signed)
Pt notified of Dr. Marliss Coots comments, she will keep an eye on BP/ Pulse and she does have someone keeping an eye on her

## 2020-10-16 NOTE — Telephone Encounter (Signed)
Pt said she took husband's meds this morning in error. Pt spoke with pharmacist and said pt should be OK but was to notify PCP also. Pt took Sinemet 25/100 taking 2 pills, Namenda 10 mg taking 1 pill and Lexapro 10 mg taking 1 pill. Pt took med at 8:30 AM. Pt said now she feels OK; pt has not taken her morning meds until Dr Glori Bickers will advise what to do. Pt wants to know what Dr Glori Bickers thinks and prefers not to call poison control.

## 2020-10-21 ENCOUNTER — Telehealth: Payer: Self-pay | Admitting: Family Medicine

## 2020-10-21 ENCOUNTER — Encounter: Payer: Self-pay | Admitting: Family Medicine

## 2020-10-21 ENCOUNTER — Ambulatory Visit (INDEPENDENT_AMBULATORY_CARE_PROVIDER_SITE_OTHER): Payer: Medicare HMO | Admitting: Family Medicine

## 2020-10-21 VITALS — BP 124/82 | HR 74 | Temp 97.2°F

## 2020-10-21 DIAGNOSIS — R109 Unspecified abdominal pain: Secondary | ICD-10-CM | POA: Diagnosis not present

## 2020-10-21 DIAGNOSIS — R197 Diarrhea, unspecified: Secondary | ICD-10-CM

## 2020-10-21 LAB — CBC WITH DIFFERENTIAL/PLATELET
Basophils Absolute: 0 10*3/uL (ref 0.0–0.1)
Basophils Relative: 0.4 % (ref 0.0–3.0)
Eosinophils Absolute: 0.3 10*3/uL (ref 0.0–0.7)
Eosinophils Relative: 4.5 % (ref 0.0–5.0)
HCT: 40.3 % (ref 36.0–46.0)
Hemoglobin: 13.6 g/dL (ref 12.0–15.0)
Lymphocytes Relative: 27.7 % (ref 12.0–46.0)
Lymphs Abs: 1.7 10*3/uL (ref 0.7–4.0)
MCHC: 33.8 g/dL (ref 30.0–36.0)
MCV: 93.6 fl (ref 78.0–100.0)
Monocytes Absolute: 0.5 10*3/uL (ref 0.1–1.0)
Monocytes Relative: 8.8 % (ref 3.0–12.0)
Neutro Abs: 3.5 10*3/uL (ref 1.4–7.7)
Neutrophils Relative %: 58.6 % (ref 43.0–77.0)
Platelets: 288 10*3/uL (ref 150.0–400.0)
RBC: 4.3 Mil/uL (ref 3.87–5.11)
RDW: 13.7 % (ref 11.5–15.5)
WBC: 6 10*3/uL (ref 4.0–10.5)

## 2020-10-21 LAB — COMPREHENSIVE METABOLIC PANEL
ALT: 22 U/L (ref 0–35)
AST: 20 U/L (ref 0–37)
Albumin: 4.4 g/dL (ref 3.5–5.2)
Alkaline Phosphatase: 75 U/L (ref 39–117)
BUN: 13 mg/dL (ref 6–23)
CO2: 29 mEq/L (ref 19–32)
Calcium: 9.2 mg/dL (ref 8.4–10.5)
Chloride: 100 mEq/L (ref 96–112)
Creatinine, Ser: 0.82 mg/dL (ref 0.40–1.20)
GFR: 76.34 mL/min (ref >60.00)
Glucose, Bld: 98 mg/dL (ref 70–99)
Potassium: 4.3 mEq/L (ref 3.5–5.1)
Sodium: 136 mEq/L (ref 135–145)
Total Bilirubin: 0.6 mg/dL (ref 0.2–1.2)
Total Protein: 7.5 g/dL (ref 6.0–8.3)

## 2020-10-21 LAB — POCT URINALYSIS DIP (CLINITEK)
Bilirubin, UA: NEGATIVE
Blood, UA: NEGATIVE
Glucose, UA: NEGATIVE mg/dL
Ketones, POC UA: NEGATIVE mg/dL
Leukocytes, UA: NEGATIVE
Nitrite, UA: NEGATIVE
POC PROTEIN,UA: NEGATIVE
Spec Grav, UA: 1.015 (ref 1.010–1.025)
Urobilinogen, UA: 0.2 E.U./dL
pH, UA: 7 (ref 5.0–8.0)

## 2020-10-21 LAB — LIPASE: Lipase: 13 U/L (ref 11.0–59.0)

## 2020-10-21 NOTE — Telephone Encounter (Signed)
Seen in the office

## 2020-10-21 NOTE — Progress Notes (Signed)
Subjective:    Patient ID: Rachel Torres, female    DOB: 27-Mar-1956, 64 y.o.   MRN: [id number]  This visit occurred during the SARS-CoV-2 public health emergency.  Safety protocols were in place, including screening questions prior to the visit, additional usage of staff PPE, and extensive cleaning of exam room while observing appropriate contact time as indicated for disinfecting solutions.    HPI Pt presents with abd pain Rachel Torres  Was here for visit with her husband who sees Dr Damita Dunnings and mentioned her symptoms, then put on my schedules   Pain is R sided  Diarrhea sat/sunday- it was constant (pressure sensation then  No blood in her stool   Felt kind of crampy   No vomiting  Has a sour stomach   Does not feel like it is her IBS  Ate out of the ordinary last week (birthday) -fatty sweets / cake with frosting   Had last colonoscopy 2014 -normal /no diverticulosis that she knows of  Had gallbladder removed Still has her appendic  Cs times 2 in the past   Wt Readings from Last 3 Encounters:  10/01/20 191 lb (86.6 kg)  09/03/20 193 lb (87.5 kg)  07/23/20 197 lb (89.4 kg)   Pulse Readings from Last 3 Encounters:  10/21/20 74  10/01/20 72  09/03/20 78    She did accidentally take husband's meds on Friday (simemet ,lexapro, namenda)   Urine clear today  Results for Rachel Torres, Rachel Torres" (MRN [id number]) as of 10/21/2020 13:22  Ref. Range 10/21/2020 08:54  Bilirubin, UA Latest Ref Range: negative  negative  Clarity, UA Latest Ref Range: clear  clear  Color, UA Latest Ref Range: yellow  yellow  Glucose Latest Ref Range: negative mg/dL negative  Leukocytes,UA Latest Ref Range: Negative  Negative  Nitrite, UA Latest Ref Range: Negative  Negative  pH, UA Latest Ref Range: 5.0 - 8.0  7.0  Specific Gravity, UA Latest Ref Range: 1.010 - 1.025  1.015  Urobilinogen, UA Latest Ref Range: 0.2 or 1.0 E.U./dL 0.2  RBC, UA Latest Ref Range: negative  negative        Patient Active Problem List   Diagnosis Date Noted  . Abdominal pain, right lateral 10/21/2020  . Acute bronchitis with bronchospasm 05/28/2020  . Diarrhea 04/11/2020  . Epigastric pain 02/19/2020  . Dark stools 02/19/2020  . PAF (paroxysmal atrial fibrillation) ([place]) 02/05/2019  . Shortness of breath 01/27/2019  . Irregular surface of cornea 12/25/2018  . HPV (human papilloma virus) infection 12/01/2016  . Screening mammogram, encounter for 11/29/2016  . Estrogen deficiency 11/29/2016  . Encounter for routine gynecological examination 11/29/2016  . Grade III hemorrhoids 10/06/2016  . Tachycardia 08/14/2016  . Hemorrhoids 08/13/2016  . Atypical migraine 08/03/2016  . CAD (coronary artery disease)   . AAA (abdominal aortic aneurysm) ([place])   . Chronic combined systolic (congestive) and diastolic (congestive) heart failure ([place])   . Prediabetes 02/11/2016  . Generalized anxiety disorder 12/08/2015  . Panic attacks 07/28/2015  . Sensorineural hearing loss 03/27/2013  . Vitamin D deficiency 07/03/2012  . H/O aortic dissection 05/12/2012  . Class 2 obesity due to excess calories with body mass index (BMI) of 35.0 to 35.9 in adult 11/21/2011  . Obesity (BMI 30.0-34.9) 11/21/2011  . Routine general medical examination at a health care facility 09/20/2011  . S/P AVR (aortic valve replacement) 01/07/2011  . CORONARY ARTERY BYPASS GRAFT, HX OF 01/07/2011  . Arthritis 12/20/2010  . Back pain 12/15/2010  .  Dissection of thoracic aorta ([place]) 12/11/2010  . H/O dissecting abdominal aortic aneurysm repair 08/24/2010  . IRRITABLE BOWEL SYNDROME 09/09/2009  . Obstructive sleep apnea 08/04/2009  . External hemorrhoid 06/26/2009  . Osteoporosis 01/09/2009  . Hyperlipidemia 07/26/2007  . Depression with anxiety 07/26/2007  . Essential hypertension 07/26/2007  . Allergic rhinitis 07/26/2007  . Asthma 07/26/2007  . GERD 07/26/2007  . Osteoarthritis 07/26/2007  . Ocular migraine  07/26/2007  . Mild intermittent asthma without complication 02/33/4356   Past Medical History:  Diagnosis Date  . AAA (abdominal aortic aneurysm) ([place])    a. Chronic w/o evidence of aneurysmal dil on CTA 01/2016.  Marland Kitchen Alcohol abuse   . Allergy   . Anemia   . Anxiety   . CAD (coronary artery disease)    a. 08/2010 s/p CABG x 1 (VG->RCA) @ time of Ao dissection repair; b. 01/2016 Lexiscan MV: mid antsept/apical defect w/ ? peri-infarct ischemia-->likely attenuation-->Med Rx.  . Chewing difficulty   . Chronic combined systolic (congestive) and diastolic (congestive) heart failure ([place])    a. 2012 EF 30-35%; b. 04/2015 EF 40-45%; c. 01/2016 Echo: Ef 55-65%, nl AoV bioprosthesis, nl RV, nl PASP.  Marland Kitchen Constipation   . Depression   . Edema of both lower extremities   . Gallbladder sludge   . GERD (gastroesophageal reflux disease)   . Heart failure ([place])   . Heart valve problem   . History of Bicuspid Aortic Valve    a. 08/2010 s/p AVR @ time of Ao dissection repair; b. 01/2016 Echo: Ef 55-65%, nl AoV bioprosthesis, nl RV, nl PASP.  Marland Kitchen Hx of repair of dissecting thoracic aortic aneurysm, [place] type A    a. 08/2010 s/p repair with AVR and VG->RCA; b. 01/2016 CTA: stable appearance of Asc Thoracic Aortic graft. Opacification of flase lumen of chronic abd Ao dissection w/ retrograde flow through lumbar arteries. No aneurysmal dil of flase lumen.  . Hyperlipidemia   . Hypertensive heart disease   . IBS (irritable bowel syndrome)   . Internal hemorrhoids   . Kidney problem   . Marfan syndrome    pt denies  . Migraine   . Obstructive sleep apnea   . Osteoarthritis   . Osteoporosis   . Palpitations   . Pneumonia   . RA (rheumatoid arthritis) ([place])    a. Followed by Dr. [name]   Past Surgical History:  Procedure Laterality Date  . ABDOMINAL AORTIC ANEURYSM REPAIR    . CARDIAC CATHETERIZATION  2001  . [place]  . CHOLECYSTECTOMY    . COLONOSCOPY    .  ESOPHAGOGASTRODUODENOSCOPY (EGD) WITH PROPOFOL N/A 05/08/2020   Procedure: ESOPHAGOGASTRODUODENOSCOPY (EGD) WITH PROPOFOL;  Surgeon: Virgel Manifold, MD;  Location: [facility] ENDOSCOPY;  Service: Endoscopy;  Laterality: N/A;  . FRACTURE SURGERY Right    shoulder replacement  . HEMORRHOID BANDING    . ORIF DISTAL RADIUS FRACTURE Left   . UPPER GASTROINTESTINAL ENDOSCOPY     Social History   Tobacco Use  . Smoking status: Former Smoker    Packs/day: 0.75    Years: 1.50    Pack years: 1.12    Types: Cigarettes    Quit date: 12/20/1978    Years since quitting: 41.8  . Smokeless tobacco: Never Used  Vaping Use  . Vaping Use: Never used  Substance Use Topics  . Alcohol use: Yes    Alcohol/week: 0.0 standard drinks  . Drug use: No   Family History  Problem Relation  Age of Onset  . Hypertension Mother   . Depression Mother   . Osteoporosis Mother   . Stroke Mother   . Irritable bowel syndrome Mother   . Asthma Mother   . Heart disease Mother   . Thyroid disease Mother   . Anxiety disorder Mother   . Arthritis Sister   . Diabetes Sister   . Heart disease Sister   . Asthma Sister   . Migraines Sister   . Nephrolithiasis Sister   . Osteoporosis Sister   . Arthritis Sister   . Asthma Sister   . Migraines Sister   . Other Father 109  . Stroke Maternal Grandmother   . Colon cancer Maternal Grandmother   . Lymphoma Maternal Grandmother   . Migraines Maternal Grandmother   . Aneurysm Paternal Grandmother        brain  . Diabetes Paternal Grandmother   . Arthritis Paternal Grandmother   . Arthritis Paternal Grandfather   . Diabetes Paternal Grandfather   . Other Brother        prediabeties  . Asthma Brother   . Migraines Brother   . Lung cancer Maternal Grandfather   . Melanoma Maternal Grandfather    Allergies  Allergen Reactions  . Fosamax [Alendronate Sodium]     GI upset   . Ginzing [Ginseng] Other (See Comments)    Hyper and jitery   . Hydrocod Polst-Cpm Polst  Er Itching  . Hydrocodone-Acetaminophen Itching  . Imitrex [Sumatriptan]     Contraindicated w/ heart condition  . Oxycodone Itching  . Peanut-Containing Drug Products Other (See Comments)    Reaction:  Migraines   . Tramadol Other (See Comments)    itching  . Hydrocodone-Guaifenesin Itching  . Prednisone Rash  . Tape Itching   Current Outpatient Medications on File Prior to Visit  Medication Sig Dispense Refill  . albuterol (PROVENTIL HFA;VENTOLIN HFA) 108 (90 Base) MCG/ACT inhaler Inhale 1-2 puffs into the lungs every 6 (six) hours as needed for wheezing or shortness of breath. 1 Inhaler 0  . ALPRAZolam (XANAX) 0.5 MG tablet Take 1 tablet (0.5 mg total) by mouth daily as needed for anxiety. 30 tablet 0  . aspirin EC 81 MG tablet Take 81 mg by mouth at bedtime.    Marland Kitchen atorvastatin (LIPITOR) 20 MG tablet TAKE 1 TABLET EVERY DAY (Patient taking differently: Take 20 mg by mouth every Monday, Wednesday, and Friday. ) 90 tablet 2  . carvedilol (COREG) 6.25 MG tablet TAKE 1 TABLET TWICE DAILY WITH A MEAL 180 tablet 1  . Cholecalciferol (VITAMIN D3) 5000 units CAPS Take 1 capsule by mouth daily.    . cyclobenzaprine (FLEXERIL) 10 MG tablet Take 0.5-1 tablets (5-10 mg total) by mouth 3 (three) times daily as needed. For headache 90 tablet 1  . furosemide (LASIX) 20 MG tablet Take 1 tablet (20 mg total) by mouth daily. Take 1 tablet (20 mg) daily, may take additional 1 tablet (20 mg) in the afternoon as needed for swelling, shortness of breath, or weight gain 180 tablet 3  . Krill Oil (OMEGA-3) 500 MG CAPS Take 1 capsule by mouth daily.    Marland Kitchen lisinopril (ZESTRIL) 5 MG tablet TAKE 1/2 TABLET EVERY DAY 45 tablet 2  . magnesium gluconate (MAGONATE) 500 MG tablet Take 500 mg by mouth at bedtime.    . Misc Natural Products (TART CHERRY ADVANCED) CAPS Take 1 capsule by mouth daily.    . naproxen sodium (ANAPROX) 220 MG tablet Take 440 mg by  mouth 2 (two) times daily as needed (for headaches/pain).    .  nystatin (MYCOSTATIN/NYSTOP) powder Apply 1 application topically 2 (two) times daily. Apply to affected area twice a day for 14 days 60 g 0  . omeprazole (PRILOSEC) 40 MG capsule Take 1 capsule (40 mg total) by mouth daily. 60 capsule 0  . potassium chloride (KLOR-CON) 10 MEQ tablet Take 1 tablet (10 mEq total) by mouth 2 (two) times daily as needed. Take additional pill when increasing Furosemide. 270 tablet 3  . promethazine (PHENERGAN) 25 MG tablet Take 0.5 tablets (12.5 mg total) by mouth every 8 (eight) hours as needed for nausea. 90 tablet 1  . Simethicone (MYLANTA GAS PO) Take by mouth 3 times/day as needed-between meals & bedtime.    Marland Kitchen venlafaxine XR (EFFEXOR-XR) 150 MG 24 hr capsule TAKE 1 CAPSULE EVERY DAY WITH BREAKFAST 90 capsule 1   No current facility-administered medications on file prior to visit.    Review of Systems  Constitutional: Negative for activity change, appetite change, fatigue, fever and unexpected weight change.  HENT: Negative for congestion, ear pain, rhinorrhea, sinus pressure and sore throat.   Eyes: Negative for pain, redness and visual disturbance.  Respiratory: Negative for cough, shortness of breath and wheezing.   Cardiovascular: Negative for chest pain and palpitations.  Gastrointestinal: Positive for abdominal pain and diarrhea. Negative for abdominal distention, blood in stool, constipation, nausea and vomiting.  Endocrine: Negative for polydipsia and polyuria.  Genitourinary: Negative for dysuria, frequency and urgency.  Musculoskeletal: Negative for arthralgias, back pain and myalgias.  Skin: Negative for pallor and rash.  Allergic/Immunologic: Negative for environmental allergies.  Neurological: Negative for dizziness, syncope and headaches.  Hematological: Negative for adenopathy. Does not bruise/bleed easily.  Psychiatric/Behavioral: Negative for decreased concentration and dysphoric mood. The patient is not nervous/anxious.        Objective:    Physical Exam Constitutional:      General: She is not in acute distress.    Appearance: She is well-developed. She is obese. She is not ill-appearing or diaphoretic.  HENT:     Head: Normocephalic and atraumatic.  Eyes:     General: No scleral icterus.    Conjunctiva/sclera: Conjunctivae normal.     Pupils: Pupils are equal, round, and reactive to light.  Cardiovascular:     Rate and Rhythm: Normal rate and regular rhythm.     Heart sounds: Normal heart sounds.  Pulmonary:     Effort: Pulmonary effort is normal. No respiratory distress.     Breath sounds: Normal breath sounds. No wheezing or rales.  Abdominal:     General: Abdomen is protuberant. Bowel sounds are normal. There is no distension or abdominal bruit. There are no signs of injury.     Palpations: Abdomen is soft. There is no splenomegaly, mass or pulsatile mass.     Tenderness: There is abdominal tenderness in the right upper quadrant and right lower quadrant. There is no right CVA tenderness, left CVA tenderness, guarding or rebound. Negative signs include Murphy's sign and McBurney's sign.     Hernia: No hernia is present.     Comments: Mild R sided tenderness (mid)  No cva tenderness but she does c/o flank pain  No worse with movement or shaking the table  No guarding or rebound   Musculoskeletal:     Cervical back: Normal range of motion and neck supple.     Right lower leg: No edema.     Left lower leg: No  edema.  Lymphadenopathy:     Cervical: No cervical adenopathy.  Skin:    General: Skin is warm and dry.     Coloration: Skin is not pale.     Findings: No erythema.  Neurological:     Mental Status: She is alert.  Psychiatric:        Mood and Affect: Mood normal.           Assessment & Plan:   Problem List Items Addressed This Visit      Other   Diarrhea    With abd discomfort on R -no other symptoms This began after dietary indiscretion over birthday last week (fatty and sugary foods)   Reassuring exam  ua clear (unlikely renal stone w/o blood)  Stat labs ordered  Disc imp of hydration ( warned not to eat until we check in)  inst to call if worse or any new symptoms before labs return       Relevant Orders   CBC with Differential/Platelet (Completed)   Comprehensive metabolic panel (Completed)   Lipase (Completed)   Abdominal pain, right lateral - Primary    Right lateral and flank discomfort with diarrhea  Started this weekend after eating poorly (birthday) -fatty foods/sugar she does not usually have  No n/v or fever  Mild tenderness on exam  ua clear (low susp of renal stone with no blood in urine)  Labs ordered-cbc/cmet and lipase Pending to make further plan       Relevant Orders   CBC with Differential/Platelet (Completed)   Comprehensive metabolic panel (Completed)   Lipase (Completed)   POCT URINALYSIS DIP (CLINITEK) (Completed)

## 2020-10-21 NOTE — Assessment & Plan Note (Signed)
Right lateral and flank discomfort with diarrhea  Started this weekend after eating poorly (birthday) -fatty foods/sugar she does not usually have  No n/v or fever  Mild tenderness on exam  ua clear (low susp of renal stone with no blood in urine)  Labs ordered-cbc/cmet and lipase Pending to make further plan

## 2020-10-21 NOTE — Patient Instructions (Addendum)
Labs and urinalysis today  If symptoms get worse let me know  We will stay in touch  Hydrate -clear liquids  Hold off on eating for now

## 2020-10-21 NOTE — Telephone Encounter (Signed)
Here at husband's appointment. Diarrhea over the weekend with abd pressure anteriorly.  Now with abd pain started last night.  R sided abd pain.  No fevers.  She had eaten some atypical foods for her birthday.  No blood in stool.  Last diarrhea was 2 days ago.  S/o cholecystectomy and AAA repair.  No one else with diarrhea.  She had covid prev and then had booster vaccine.  No burning with urination.  Possible h/o renal stones in distant past.    She is going to have eval with PCP.  I updated PCP.  124/82  97% RA p74 97.2.   I will defer to PCP.

## 2020-10-21 NOTE — Assessment & Plan Note (Signed)
With abd discomfort on R -no other symptoms This began after dietary indiscretion over birthday last week (fatty and sugary foods)  Reassuring exam  ua clear (unlikely renal stone w/o blood)  Stat labs ordered  Disc imp of hydration ( warned not to eat until we check in)  inst to call if worse or any new symptoms before labs return

## 2020-10-22 ENCOUNTER — Ambulatory Visit (INDEPENDENT_AMBULATORY_CARE_PROVIDER_SITE_OTHER): Payer: Medicare HMO | Admitting: Psychology

## 2020-10-22 ENCOUNTER — Ambulatory Visit: Payer: Medicare HMO | Admitting: Family Medicine

## 2020-10-22 DIAGNOSIS — F4323 Adjustment disorder with mixed anxiety and depressed mood: Secondary | ICD-10-CM | POA: Diagnosis not present

## 2020-10-27 NOTE — Progress Notes (Signed)
Cardiology Office Note  Date:  10/28/2020   ID:  CHIMAMANDA SIEGFRIED, DOB March 16, 1956, MRN [id number]  PCP:  [name], MD   Chief Complaint  Patient presents with  . office visit    6 month F/U; Meds verbally reviewed with patient.    HPI:  Ms. Pierre is a 64 year-old woman with past medical history of  aorta type A dissection in September 2011   rushed to [facility] and underwent aortic valve replacement,  bypass of the right coronary artery with venous donor site from her right thigh,  aortic grafting for a extensive dissection of the  ascending aorta,  residual descending aortic dissection extending into the iliac artery,  EF 30 to 35,  up to 45 to 50% in August 2020 chronic back pain,  symptoms of shortness of breath,  abdominal pain,  hypertension  She reports pathology of her aortic valve showed bicuspid valve Chest pain episodes resolved by GI cocktail She presents today for follow-up of her aortic valve replacement and CABG.  Recovered from covid 11/2019 Got it from husband he spent time in rehab  Down 20 pounds since 04/2020 Eating better With starting more regular exercise Interested in joining [facility], several sneakers at the hospital  Denies any shortness of breath on exertion, no chest pain  Prior records reviewed from vascular summer 2020, stable CT scans reviewed with her Plan was for repeat scan in 2 years with follow-up with vascular 2022  Lab work reviewed HBA1c 5.6 Total chol 198  Denies any significant leg swelling PND orthopnea  EKG personally reviewed by myself on todays visit Shows normal sinus rhythm with rate 66 bpm nonspecific T wave abnormality  Other past medical history reviewed Echo 07/2019, 1. The left ventricle has mildly reduced systolic function, with an  ejection fraction of 45-50%. The cavity size was mildly dilated. Left  ventricular diastolic Doppler parameters are consistent with  pseudonormalization. Left  ventricular diffuse  hypokinesis.  2. The right ventricle has normal systolic function. The cavity was  normal. There is no increase in right ventricular wall thickness. Right  ventricular systolic pressure is mildly elevated with an estimated  pressure of 31.3 mmHg.  3. Left atrial size was mildly dilated.  4. A bioprosthesis valve is present in the aortic position. Procedure  Date: 08/2010 Normal aortic valve prosthesis. Mean gradient 5.5 mm Hg   Feb 2020: Flu A positive rhinovirus, [place]  Echo 07/2019, EF 45 to 50%    Lab Results  Component Value Date   CHOL 198 07/23/2020   HDL 52 07/23/2020   LDLCALC 122 (H) 07/23/2020   TRIG 136 07/23/2020    Other past medical history reviewed Previous  30 day monitor ordered for palpitations,  APCs and PVCs  diagnosed with obstructive sleep apnea. She has a CPAP machine but she is not using it.  Previous evaluations in the emergency room for chest pain. Cardiac enzymes were negative , EKG unchanged .She had a CT scan dated 06/20/2013. She was transferred to [facility] given her complicated anatomy. images were evaluated by vascular surgery it was determined there have been no significant progression of her disease  Previous evaluation at [facility] for shortness of breath and chest discomfort. She had workup for cardiac issues including echocardiogram and PET stress, CTA of the chest showing stable descending aorta repair with type B. aortic dissection with opacification of the false lumen at the level of the renal arteries via a lumbar artery.  Echocardiogram showed ejection fraction had improved to 45-50%, mild MR, TR (ejection fraction improved from 30-35% in January 2012)  Previous H/o abdominal pain, further workup showing biliary stones and sludge. s/p  laparoscopic cholecystectomy at [facility].    PMH:   has a past medical history of AAA (abdominal aortic aneurysm) ([place]), Alcohol abuse, Allergy, Anemia, Anxiety, CAD (coronary artery  disease), Chewing difficulty, Chronic combined systolic (congestive) and diastolic (congestive) heart failure ([name]), Constipation, Depression, Edema of both lower extremities, Gallbladder sludge, GERD (gastroesophageal reflux disease), Heart failure ([place]), Heart valve problem, History of Bicuspid Aortic Valve, repair of dissecting thoracic aortic aneurysm, Stanford type A, Hyperlipidemia, Hypertensive heart disease, IBS (irritable bowel syndrome), Internal hemorrhoids, Kidney problem, Marfan syndrome, Migraine, Obstructive sleep apnea, Osteoarthritis, Osteoporosis, Palpitations, Pneumonia, and RA (rheumatoid arthritis) ([place]).  PSH:    Past Surgical History:  Procedure Laterality Date  . ABDOMINAL AORTIC ANEURYSM REPAIR    . CARDIAC CATHETERIZATION  2001  . [place]  . CHOLECYSTECTOMY    . COLONOSCOPY    . ESOPHAGOGASTRODUODENOSCOPY (EGD) WITH PROPOFOL N/A 05/08/2020   Procedure: ESOPHAGOGASTRODUODENOSCOPY (EGD) WITH PROPOFOL;  Surgeon: Virgel Manifold, MD;  Location: [facility] ENDOSCOPY;  Service: Endoscopy;  Laterality: N/A;  . FRACTURE SURGERY Right    shoulder replacement  . HEMORRHOID BANDING    . ORIF DISTAL RADIUS FRACTURE Left   . UPPER GASTROINTESTINAL ENDOSCOPY      Current Outpatient Medications  Medication Sig Dispense Refill  . albuterol (PROVENTIL HFA;VENTOLIN HFA) 108 (90 Base) MCG/ACT inhaler Inhale 1-2 puffs into the lungs every 6 (six) hours as needed for wheezing or shortness of breath. 1 Inhaler 0  . ALPRAZolam (XANAX) 0.5 MG tablet Take 1 tablet (0.5 mg total) by mouth daily as needed for anxiety. 30 tablet 0  . aspirin EC 81 MG tablet Take 81 mg by mouth at bedtime.    Marland Kitchen atorvastatin (LIPITOR) 20 MG tablet Take 20 mg by mouth every Monday, Wednesday, and Friday.    . carvedilol (COREG) 6.25 MG tablet TAKE 1 TABLET TWICE DAILY WITH A MEAL 180 tablet 1  . Cholecalciferol (VITAMIN D3) 5000 units CAPS Take 1 capsule by mouth daily.    . cyclobenzaprine  (FLEXERIL) 10 MG tablet Take 0.5-1 tablets (5-10 mg total) by mouth 3 (three) times daily as needed. For headache 90 tablet 1  . furosemide (LASIX) 20 MG tablet Take 1 tablet (20 mg total) by mouth daily. Take 1 tablet (20 mg) daily, may take additional 1 tablet (20 mg) in the afternoon as needed for swelling, shortness of breath, or weight gain 180 tablet 3  . Krill Oil (OMEGA-3) 500 MG CAPS Take 1 capsule by mouth daily.    Marland Kitchen lisinopril (ZESTRIL) 5 MG tablet TAKE 1/2 TABLET EVERY DAY 45 tablet 2  . magnesium gluconate (MAGONATE) 500 MG tablet Take 500 mg by mouth at bedtime.    . Misc Natural Products (TART CHERRY ADVANCED) CAPS Take 1 capsule by mouth daily.    . naproxen sodium (ANAPROX) 220 MG tablet Take 440 mg by mouth 2 (two) times daily as needed (for headaches/pain).    . nystatin (MYCOSTATIN/NYSTOP) powder Apply 1 application topically 2 (two) times daily. Apply to affected area twice a day for 14 days 60 g 0  . omeprazole (PRILOSEC) 40 MG capsule Take 1 capsule (40 mg total) by mouth daily. 60 capsule 0  . potassium chloride (KLOR-CON) 10 MEQ tablet Take 1 tablet (10 mEq total) by mouth 2 (two)  times daily as needed. Take additional pill when increasing Furosemide. 270 tablet 3  . promethazine (PHENERGAN) 25 MG tablet Take 0.5 tablets (12.5 mg total) by mouth every 8 (eight) hours as needed for nausea. 90 tablet 1  . Simethicone (MYLANTA GAS PO) Take by mouth 3 times/day as needed-between meals & bedtime.    Marland Kitchen venlafaxine XR (EFFEXOR-XR) 150 MG 24 hr capsule TAKE 1 CAPSULE EVERY DAY WITH BREAKFAST 90 capsule 1   No current facility-administered medications for this visit.     Allergies:   Fosamax [alendronate sodium], Ginzing [ginseng], Hydrocod polst-cpm polst er, Hydrocodone-acetaminophen, Imitrex [sumatriptan], Oxycodone, Peanut-containing drug products, Tramadol, Hydrocodone-guaifenesin, Prednisone, and Tape   Social History:  The patient  reports that she quit smoking about 41  years ago. Her smoking use included cigarettes. She has a 1.13 pack-year smoking history. She has never used smokeless tobacco. She reports current alcohol use. She reports that she does not use drugs.   Family History:   family history includes Aneurysm in her paternal grandmother; Anxiety disorder in her mother; Arthritis in her paternal grandfather, paternal grandmother, sister, and sister; Asthma in her brother, mother, sister, and sister; Colon cancer in her maternal grandmother; Depression in her mother; Diabetes in her paternal grandfather, paternal grandmother, and sister; Heart disease in her mother and sister; Hypertension in her mother; Irritable bowel syndrome in her mother; Lung cancer in her maternal grandfather; Lymphoma in her maternal grandmother; Melanoma in her maternal grandfather; Migraines in her brother, maternal grandmother, sister, and sister; Nephrolithiasis in her sister; Osteoporosis in her mother and sister; Other in her brother; Other (age of onset: 53) in her father; Stroke in her maternal grandmother and mother; Thyroid disease in her mother.    Review of Systems: Review of Systems  Constitutional: Negative.   HENT: Negative.   Respiratory: Positive for shortness of breath.   Cardiovascular: Negative.   Gastrointestinal: Negative.   Musculoskeletal: Negative.   Neurological: Negative.   Psychiatric/Behavioral: Negative.   All other systems reviewed and are negative.   PHYSICAL EXAM: VS:  BP 130/86 (BP Location: Left Arm, Patient Position: Sitting, Cuff Size: Normal)   Pulse 66   Ht 5\' 5"  (1.651 m)   Wt 196 lb (88.9 kg)   SpO2 98%   BMI 32.62 kg/m  , BMI Body mass index is 32.62 kg/m. Constitutional:  oriented to person, place, and time. No distress.  HENT:  Head: Grossly normal Eyes:  no discharge. No scleral icterus.  Neck: No JVD, no carotid bruits  Cardiovascular: Regular rate and rhythm, no murmurs appreciated Pulmonary/Chest: Clear to auscultation  bilaterally, no wheezes or rails Abdominal: Soft.  no distension.  no tenderness.  Musculoskeletal: Normal range of motion Neurological:  normal muscle tone. Coordination normal. No atrophy Skin: Skin warm and dry Psychiatric: normal affect, pleasant   Recent Labs: 05/05/2020: TSH 1.040 10/21/2020: ALT 22; BUN 13; Creatinine, Ser 0.82; Hemoglobin 13.6; Platelets 288.0; Potassium 4.3; Sodium 136    Lipid Panel Lab Results  Component Value Date   CHOL 198 07/23/2020   HDL 52 07/23/2020   LDLCALC 122 (H) 07/23/2020   TRIG 136 07/23/2020     Wt Readings from Last 3 Encounters:  10/28/20 196 lb (88.9 kg)  10/01/20 191 lb (86.6 kg)  09/03/20 193 lb (87.5 kg)     ASSESSMENT AND PLAN:  Pure hypercholesterolemia Cholesterol is at goal on the current lipid regimen. No changes to the medications were made. Stable  Essential hypertension -  Blood pressure  is well controlled on today's visit. No changes made to the medications.  CORONARY ARTERY BYPASS GRAFT Chronic stable angina Interested in regular exercise program, will send referral to [facility] No medication changes made  Dissection of thoracoabdominal aorta ([place]) Followed by vascular surgeryin , Dr. Aurelio Jew on recent eval , CT 07/2019, reviewed Follow-up of vascular 2022 with scan at that time  H/O dissecting abdominal aortic aneurysm repair -  MRI 2020  Followed by vascular in [place]  Chronic diastolic CHF euvolemic  echo EF 45% Stable  Abdominal pain Symptoms improved  Morbid obesity Weight down 20 pounds through diet and exercise   Total encounter time more than 25 minutes  Greater than 50% was spent in counseling and coordination of care with the patient     Orders Placed This Encounter  Procedures  . EKG 12-Lead     Signed, Esmond Plants, M.D., Ph.D. 10/28/2020  [place], [place]

## 2020-10-28 ENCOUNTER — Ambulatory Visit (INDEPENDENT_AMBULATORY_CARE_PROVIDER_SITE_OTHER): Payer: Medicare HMO | Admitting: Psychology

## 2020-10-28 ENCOUNTER — Ambulatory Visit (INDEPENDENT_AMBULATORY_CARE_PROVIDER_SITE_OTHER): Payer: Medicare HMO | Admitting: Cardiovascular Disease

## 2020-10-28 ENCOUNTER — Other Ambulatory Visit: Payer: Self-pay

## 2020-10-28 ENCOUNTER — Encounter: Payer: Self-pay | Admitting: Cardiovascular Disease

## 2020-10-28 VITALS — BP 130/86 | HR 66 | Ht 65.0 in | Wt 196.0 lb

## 2020-10-28 DIAGNOSIS — I10 Essential (primary) hypertension: Secondary | ICD-10-CM | POA: Diagnosis not present

## 2020-10-28 DIAGNOSIS — I7103 Dissection of thoracoabdominal aorta: Secondary | ICD-10-CM | POA: Diagnosis not present

## 2020-10-28 DIAGNOSIS — I5042 Chronic combined systolic (congestive) and diastolic (congestive) heart failure: Secondary | ICD-10-CM | POA: Diagnosis not present

## 2020-10-28 DIAGNOSIS — F4323 Adjustment disorder with mixed anxiety and depressed mood: Secondary | ICD-10-CM

## 2020-10-28 DIAGNOSIS — I480 Paroxysmal atrial fibrillation: Secondary | ICD-10-CM

## 2020-10-28 DIAGNOSIS — Z952 Presence of prosthetic heart valve: Secondary | ICD-10-CM

## 2020-10-28 DIAGNOSIS — I2510 Atherosclerotic heart disease of native coronary artery without angina pectoris: Secondary | ICD-10-CM

## 2020-10-28 NOTE — Patient Instructions (Signed)
Referral to [facility]   Medication Instructions:  No changes  If you need a refill on your cardiac medications before your next appointment, please call your pharmacy.    Lab work: No new labs needed   If you have labs (blood work) drawn today and your tests are completely normal, you will receive your results only by: Marland Kitchen MyChart Message (if you have MyChart) OR . A paper copy in the mail If you have any lab test that is abnormal or we need to change your treatment, we will call you to review the results.   Testing/Procedures: No new testing needed   Follow-Up: At [facility], you and your health needs are our priority.  As part of our continuing mission to provide you with exceptional heart care, we have created designated Provider Care Teams.  These Care Teams include your primary Cardiologist (physician) and Advanced Practice Providers (APPs -  Physician Assistants and Nurse Practitioners) who all work together to provide you with the care you need, when you need it.  . You will need a follow up appointment in 12 months  . Providers on your designated Care Team:   . Murray Hodgkins, NP . Christell Faith, PA-C . Marrianne Mood, PA-C  Any Other Special Instructions Will Be Listed Below (If Applicable).  COVID-19 Vaccine Information can be found at: [email or web address] For questions related to vaccine distribution or appointments, please email [email or web address] or call [phone].

## 2020-10-29 ENCOUNTER — Ambulatory Visit (INDEPENDENT_AMBULATORY_CARE_PROVIDER_SITE_OTHER): Payer: Medicare HMO | Admitting: Family Medicine

## 2020-10-29 ENCOUNTER — Encounter (INDEPENDENT_AMBULATORY_CARE_PROVIDER_SITE_OTHER): Payer: Self-pay | Admitting: Family Medicine

## 2020-10-29 VITALS — BP 129/7 | HR 69 | Temp 98.3°F | Ht 65.0 in | Wt 190.0 lb

## 2020-10-29 DIAGNOSIS — Z9189 Other specified personal risk factors, not elsewhere classified: Secondary | ICD-10-CM | POA: Diagnosis not present

## 2020-10-29 DIAGNOSIS — Z6831 Body mass index (BMI) 31.0-31.9, adult: Secondary | ICD-10-CM | POA: Diagnosis not present

## 2020-10-29 DIAGNOSIS — E669 Obesity, unspecified: Secondary | ICD-10-CM

## 2020-10-29 DIAGNOSIS — E559 Vitamin D deficiency, unspecified: Secondary | ICD-10-CM | POA: Diagnosis not present

## 2020-10-29 DIAGNOSIS — E7849 Other hyperlipidemia: Secondary | ICD-10-CM | POA: Diagnosis not present

## 2020-10-29 DIAGNOSIS — R5383 Other fatigue: Secondary | ICD-10-CM

## 2020-10-29 DIAGNOSIS — R7303 Prediabetes: Secondary | ICD-10-CM | POA: Diagnosis not present

## 2020-10-30 ENCOUNTER — Telehealth: Payer: Self-pay | Admitting: *Deleted

## 2020-10-30 LAB — LIPID PANEL WITH LDL/HDL RATIO
Cholesterol, Total: 215 mg/dL — ABNORMAL HIGH (ref 100–199)
HDL: 65 mg/dL (ref >39)
LDL Chol Calc (NIH): 128 mg/dL — ABNORMAL HIGH (ref 0–99)
LDL/HDL Ratio: 2 ratio (ref 0.0–3.2)
Triglycerides: 124 mg/dL (ref 0–149)
VLDL Cholesterol Cal: 22 mg/dL (ref 5–40)

## 2020-10-30 LAB — INSULIN, RANDOM: INSULIN: 13.9 u[IU]/mL (ref 2.6–24.9)

## 2020-10-30 LAB — HEMOGLOBIN A1C
Est. average glucose Bld gHb Est-mCnc: 117 mg/dL
Hgb A1c MFr Bld: 5.7 % — ABNORMAL HIGH (ref 4.8–5.6)

## 2020-10-30 LAB — VITAMIN D 25 HYDROXY (VIT D DEFICIENCY, FRACTURES): Vit D, 25-Hydroxy: 59.5 ng/mL (ref 30.0–100.0)

## 2020-10-30 NOTE — Telephone Encounter (Signed)
-----   Message from Lynford Humphrey, RN sent at 10/29/2020  6:53 AM EST ----- She can join the [employer] . The number to call is [phone]. 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 They have Silver Sneakers,Silver N Fit ,and Renew Active discounts too! ----- Message ----- From: Valora Corporal, RN Sent: 10/28/2020   4:30 PM EST To: Lynford Humphrey, RN  Pt wants to attend [facility] or silver sneakers program

## 2020-10-30 NOTE — Progress Notes (Signed)
Chief Complaint:   OBESITY Rachel Torres is here to discuss her progress with her obesity treatment plan along with follow-up of her obesity related diagnoses. Rachel Torres is on following a lower carbohydrate, vegetable and lean protein rich diet plan and states she is following her eating plan approximately 70% of the time. Rachel Torres states she is doing light cardio for 10 minutes 4 times per week.  Today's visit was #: 8 Starting weight: 213 lbs Starting date: 05/05/2020 Today's weight: 190 lbs Today's date: 10/29/2020 Total lbs lost to date: 23 Total lbs lost since last in-office visit: 1  Interim History: Rachel Torres continues to do well with weight loss even with increased birthday celebration eating. She will host Thanksgiving and she has some strategies to help do damage control and limit leftover eating.  Subjective:   1. Vitamin D deficiency Rachel Torres is on Vit D, and she is due to have labs rechecked.  2. Other hyperlipidemia Rachel Torres is working on diet and weight loss, and she is due for labs.  3. Pre-diabetes Rachel Torres is stable on diet, and she is due for labs.  4. Caregiver with fatigue Rachel Torres is caring for her husband with Parkinson's who has some memory loss. Her mother is living with her as well but will be moving out within the next 2 months. Her sleep is on and off.  5. At risk for heart disease Rachel Torres is at a higher than average risk for cardiovascular disease due to obesity.   Assessment/Plan:   1. Vitamin D deficiency Low Vitamin D level contributes to fatigue and are associated with obesity, breast, and colon cancer. We will check labs today. Rachel Torres will follow-up for routine testing of Vitamin D, at least 2-3 times per year to avoid over-replacement.  - VITAMIN D 25 Hydroxy (Vit-D Deficiency, Fractures)  2. Other hyperlipidemia Cardiovascular risk and specific lipid/LDL goals reviewed. We discussed several lifestyle modifications today and Rachel Torres will continue to  work on diet, exercise and weight loss efforts. We will check labs today. Orders and follow up as documented in patient record.   - Lipid Panel With LDL/HDL Ratio  3. Pre-diabetes Rachel Torres will continue to work on weight loss, diet, exercise, and decreasing simple carbohydrates to help decrease the risk of diabetes. We will check labs today.  - Insulin, random - Hemoglobin A1c  4. Caregiver with fatigue Rachel Torres was encouraged to use her resources to help deal with her increased stress, and coping strategies were discussed today.  5. At risk for heart disease Rachel Torres was given approximately 15 minutes of coronary artery disease prevention counseling today. She is 64 y.o. female and has risk factors for heart disease including obesity. We discussed intensive lifestyle modifications today with an emphasis on specific weight loss instructions and strategies.   Repetitive spaced learning was employed today to elicit superior memory formation and behavioral change.  6. Class 1 obesity with serious comorbidity and body mass index (BMI) of 31.0 to 31.9 in adult, unspecified obesity type Rachel Torres is currently in the action stage of change. As such, her goal is to continue with weight loss efforts. She has agreed to following a lower carbohydrate, vegetable and lean protein rich diet plan.   Exercise goals: As is.  Behavioral modification strategies: increasing lean protein intake.  Rachel Torres has agreed to follow-up with our clinic in 3 to 4 weeks. She was informed of the importance of frequent follow-up visits to maximize her success with intensive lifestyle modifications for her multiple health conditions.  Rachel Torres was informed we would discuss her lab results at her next visit unless there is a critical issue that needs to be addressed sooner. Rachel Torres agreed to keep her next visit at the agreed upon time to discuss these results.  Objective:   Blood pressure (!) 129/7, pulse 69, temperature 98.3  F (36.8 C), height 5\' 5"  (1.651 m), weight 190 lb (86.2 kg), SpO2 96 %. Body mass index is 31.62 kg/m.  General: Cooperative, alert, well developed, in no acute distress. HEENT: Conjunctivae and lids unremarkable. Cardiovascular: Regular rhythm.  Lungs: Normal work of breathing. Neurologic: No focal deficits.   Lab Results  Component Value Date   CREATININE 0.82 10/21/2020   BUN 13 10/21/2020   NA 136 10/21/2020   K 4.3 10/21/2020   CL 100 10/21/2020   CO2 29 10/21/2020   Lab Results  Component Value Date   ALT 22 10/21/2020   AST 20 10/21/2020   ALKPHOS 75 10/21/2020   BILITOT 0.6 10/21/2020   Lab Results  Component Value Date   HGBA1C 5.7 (H) 10/29/2020   HGBA1C 5.6 07/23/2020   HGBA1C 5.9 (H) 05/05/2020   HGBA1C 6.0 06/07/2019   HGBA1C 6.1 06/26/2018   Lab Results  Component Value Date   INSULIN 13.9 10/29/2020   INSULIN 22.5 07/23/2020   INSULIN 25.2 (H) 05/05/2020   Lab Results  Component Value Date   TSH 1.040 05/05/2020   Lab Results  Component Value Date   CHOL 215 (H) 10/29/2020   HDL 65 10/29/2020   LDLCALC 128 (H) 10/29/2020   LDLDIRECT 189.4 10/13/2011   TRIG 124 10/29/2020   CHOLHDL 3 06/07/2019   Lab Results  Component Value Date   WBC 6.0 10/21/2020   HGB 13.6 10/21/2020   HCT 40.3 10/21/2020   MCV 93.6 10/21/2020   PLT 288.0 10/21/2020   Lab Results  Component Value Date   FERRITIN 105.0 12/11/2010   Attestation Statements:   Reviewed by clinician on day of visit: allergies, medications, problem list, medical history, surgical history, family history, social history, and previous encounter notes.   I, Trixie Dredge, am acting as transcriptionist for [name], MD.  I have reviewed the above documentation for accuracy and completeness, and I agree with the above. -  [name], MD

## 2020-10-30 NOTE — Telephone Encounter (Signed)
Patient notified and will call to see what she can do.

## 2020-11-05 ENCOUNTER — Ambulatory Visit (INDEPENDENT_AMBULATORY_CARE_PROVIDER_SITE_OTHER): Payer: Medicare HMO | Admitting: Psychology

## 2020-11-05 DIAGNOSIS — F4323 Adjustment disorder with mixed anxiety and depressed mood: Secondary | ICD-10-CM | POA: Diagnosis not present

## 2020-11-09 ENCOUNTER — Other Ambulatory Visit: Payer: Self-pay | Admitting: Cardiovascular Disease

## 2020-11-18 ENCOUNTER — Ambulatory Visit (INDEPENDENT_AMBULATORY_CARE_PROVIDER_SITE_OTHER): Payer: Medicare HMO | Admitting: Psychology

## 2020-11-18 DIAGNOSIS — F4323 Adjustment disorder with mixed anxiety and depressed mood: Secondary | ICD-10-CM | POA: Diagnosis not present

## 2020-11-19 ENCOUNTER — Other Ambulatory Visit: Payer: Self-pay | Admitting: Family Medicine

## 2020-11-25 ENCOUNTER — Ambulatory Visit (INDEPENDENT_AMBULATORY_CARE_PROVIDER_SITE_OTHER): Payer: Medicare HMO | Admitting: Family Medicine

## 2020-11-25 ENCOUNTER — Other Ambulatory Visit: Payer: Self-pay

## 2020-11-25 ENCOUNTER — Encounter (INDEPENDENT_AMBULATORY_CARE_PROVIDER_SITE_OTHER): Payer: Self-pay | Admitting: Family Medicine

## 2020-11-25 VITALS — BP 130/73 | HR 66 | Temp 97.8°F | Ht 65.0 in | Wt 189.0 lb

## 2020-11-25 DIAGNOSIS — R7303 Prediabetes: Secondary | ICD-10-CM | POA: Diagnosis not present

## 2020-11-25 DIAGNOSIS — E669 Obesity, unspecified: Secondary | ICD-10-CM

## 2020-11-25 DIAGNOSIS — Z6831 Body mass index (BMI) 31.0-31.9, adult: Secondary | ICD-10-CM | POA: Diagnosis not present

## 2020-11-26 NOTE — Progress Notes (Signed)
Chief Complaint:   OBESITY Rachel Torres is here to discuss her progress with her obesity treatment plan along with follow-up of her obesity related diagnoses. Rachel Torres is on following a lower carbohydrate, vegetable and lean protein rich diet plan and states she is following her eating plan approximately 75% of the time. Rachel Torres states she is doing 0 minutes 0 times per week.  Today's visit was #: 9 Starting weight: 213 lbs Starting date: 05/05/2020 Today's weight: 189 lbs Today's date: 11/25/2020 Total lbs lost to date: 24 Total lbs lost since last in-office visit: 1  Interim History: Rachel Torres has done well with weight loss even over Thanksgiving. She is doing a modified lower carbohydrate plan. She plans on starting to go to the gym soon, and her mother in-law will be moving out this month which should make meal planning and prepping easier.  Subjective:   1. Pre-diabetes Rachel Torres continues to work on diet, exercise, and weight loss. She is decreasing simple carbohydrates, and she is concentrating on lean higher protein foods. She denies signs of hypoglycemia.  Assessment/Plan:   1. Pre-diabetes Rachel Torres will continue to work on weight loss, diet, exercise, and decreasing simple carbohydrates to help decrease the risk of diabetes. We will recheck labs in 3 months.  2. Class 1 obesity with serious comorbidity and body mass index (BMI) of 31.0 to 31.9 in adult, unspecified obesity type Rachel Torres is currently in the action stage of change. As such, her goal is to continue with weight loss efforts. She has agreed to following a lower carbohydrate, vegetable and lean protein rich diet plan.   Behavioral modification strategies: meal planning and cooking strategies.  Rachel Torres has agreed to follow-up with our clinic in 4 to 5 weeks. She was informed of the importance of frequent follow-up visits to maximize her success with intensive lifestyle modifications for her multiple health conditions.    Objective:   Blood pressure 130/73, pulse 66, temperature 97.8 F (36.6 C), height 5\' 5"  (1.651 m), weight 189 lb (85.7 kg), SpO2 95 %. Body mass index is 31.45 kg/m.  General: Cooperative, alert, well developed, in no acute distress. HEENT: Conjunctivae and lids unremarkable. Cardiovascular: Regular rhythm.  Lungs: Normal work of breathing. Neurologic: No focal deficits.   Lab Results  Component Value Date   CREATININE 0.82 10/21/2020   BUN 13 10/21/2020   NA 136 10/21/2020   K 4.3 10/21/2020   CL 100 10/21/2020   CO2 29 10/21/2020   Lab Results  Component Value Date   ALT 22 10/21/2020   AST 20 10/21/2020   ALKPHOS 75 10/21/2020   BILITOT 0.6 10/21/2020   Lab Results  Component Value Date   HGBA1C 5.7 (H) 10/29/2020   HGBA1C 5.6 07/23/2020   HGBA1C 5.9 (H) 05/05/2020   HGBA1C 6.0 06/07/2019   HGBA1C 6.1 06/26/2018   Lab Results  Component Value Date   INSULIN 13.9 10/29/2020   INSULIN 22.5 07/23/2020   INSULIN 25.2 (H) 05/05/2020   Lab Results  Component Value Date   TSH 1.040 05/05/2020   Lab Results  Component Value Date   CHOL 215 (H) 10/29/2020   HDL 65 10/29/2020   LDLCALC 128 (H) 10/29/2020   LDLDIRECT 189.4 10/13/2011   TRIG 124 10/29/2020   CHOLHDL 3 06/07/2019   Lab Results  Component Value Date   WBC 6.0 10/21/2020   HGB 13.6 10/21/2020   HCT 40.3 10/21/2020   MCV 93.6 10/21/2020   PLT 288.0 10/21/2020   Lab Results  Component Value Date   FERRITIN 105.0 12/11/2010   Attestation Statements:   Reviewed by clinician on day of visit: allergies, medications, problem list, medical history, surgical history, family history, social history, and previous encounter notes.  Time spent on visit including pre-visit chart review and post-visit care and charting was 25 minutes.    I, Trixie Dredge, am acting as transcriptionist for [name], MD.  I have reviewed the above documentation for accuracy and completeness, and I agree with  the above. -  [name], MD

## 2020-12-03 ENCOUNTER — Ambulatory Visit (INDEPENDENT_AMBULATORY_CARE_PROVIDER_SITE_OTHER): Payer: Medicare HMO | Admitting: Psychology

## 2020-12-03 DIAGNOSIS — F4323 Adjustment disorder with mixed anxiety and depressed mood: Secondary | ICD-10-CM | POA: Diagnosis not present

## 2020-12-12 DIAGNOSIS — J069 Acute upper respiratory infection, unspecified: Secondary | ICD-10-CM | POA: Diagnosis not present

## 2020-12-17 ENCOUNTER — Ambulatory Visit: Payer: Medicare HMO | Admitting: Psychology

## 2020-12-22 ENCOUNTER — Ambulatory Visit (INDEPENDENT_AMBULATORY_CARE_PROVIDER_SITE_OTHER): Payer: Medicare HMO | Admitting: Family Medicine

## 2020-12-24 ENCOUNTER — Telehealth: Payer: Self-pay

## 2020-12-24 NOTE — Chronic Care Management (AMB) (Addendum)
Chronic Care Management Pharmacy Assistant   Name: Rachel Torres  MRN: [id number] DOB: 12-Jan-1956  Reason for Encounter: Disease State/Adherence  Patient Questions:  1.  Have you seen any other providers since your last visit? Yes 11/25/20 [facility] 10/29/20- [facility] 10/28/20- Rachel Torres- Cardiology.   2.  Any changes in your medicines or health? Yes 10/28/20 Dr. Rockey Torres changed Atorvastatin from daily to [facility], Wednesday and Friday.    PCP : Rachel Greenspan, MD  Allergies:   Allergies  Allergen Reactions   Fosamax [Alendronate Sodium]     GI upset    Ginzing [Ginseng] Other (See Comments)    Hyper and jitery    Hydrocod Polst-Cpm Polst Er Itching   Hydrocodone-Acetaminophen Itching   Imitrex [Sumatriptan]     Contraindicated w/ heart condition   Oxycodone Itching   Peanut-Containing Drug Products Other (See Comments)    Reaction:  Migraines    Tramadol Other (See Comments)    itching   Hydrocodone-Guaifenesin Itching   Prednisone Rash   Tape Itching    Medications: Outpatient Encounter Medications as of 12/24/2020  Medication Sig   albuterol (PROVENTIL HFA;VENTOLIN HFA) 108 (90 Base) MCG/ACT inhaler Inhale 1-2 puffs into the lungs every 6 (six) hours as needed for wheezing or shortness of breath.   ALPRAZolam (XANAX) 0.5 MG tablet Take 1 tablet (0.5 mg total) by mouth daily as needed for anxiety.   aspirin EC 81 MG tablet Take 81 mg by mouth at bedtime.   atorvastatin (LIPITOR) 20 MG tablet Take 20 mg by mouth every Monday, Wednesday, and Friday.   carvedilol (COREG) 6.25 MG tablet TAKE 1 TABLET TWICE DAILY WITH A MEAL   Cholecalciferol (VITAMIN D3) 5000 units CAPS Take 1 capsule by mouth daily.   cyclobenzaprine (FLEXERIL) 10 MG tablet Take 0.5-1 tablets (5-10 mg total) by mouth 3 (three) times daily as needed. For headache   furosemide (LASIX) 20 MG tablet Take 1 tablet (20 mg total) by mouth daily. Take 1 tablet (20  mg) daily, may take additional 1 tablet (20 mg) in the afternoon as needed for swelling, shortness of breath, or weight gain   Krill Oil (OMEGA-3) 500 MG CAPS Take 1 capsule by mouth daily.   lisinopril (ZESTRIL) 5 MG tablet TAKE 1/2 TABLET EVERY DAY   magnesium gluconate (MAGONATE) 500 MG tablet Take 500 mg by mouth at bedtime.   Misc Natural Products (TART CHERRY ADVANCED) CAPS Take 1 capsule by mouth daily.   naproxen sodium (ANAPROX) 220 MG tablet Take 440 mg by mouth 2 (two) times daily as needed (for headaches/pain).   nystatin (MYCOSTATIN/NYSTOP) powder Apply 1 application topically 2 (two) times daily. Apply to affected area twice a day for 14 days   omeprazole (PRILOSEC) 40 MG capsule Take 1 capsule (40 mg total) by mouth daily.   potassium chloride (KLOR-CON) 10 MEQ tablet Take 1 tablet (10 mEq total) by mouth 2 (two) times daily as needed. Take additional pill when increasing Furosemide.   promethazine (PHENERGAN) 25 MG tablet Take 0.5 tablets (12.5 mg total) by mouth every 8 (eight) hours as needed for nausea.   Simethicone (MYLANTA GAS PO) Take by mouth 3 times/day as needed-between meals & bedtime.   venlafaxine XR (EFFEXOR-XR) 150 MG 24 hr capsule TAKE 1 CAPSULE EVERY DAY WITH BREAKFAST   No facility-administered encounter medications on file as of 12/24/2020.    Current Diagnosis: Patient Active Problem List   Diagnosis Date Noted  Abdominal pain, right lateral 10/21/2020   Acute bronchitis with bronchospasm 05/28/2020   Diarrhea 04/11/2020   Epigastric pain 02/19/2020   Dark stools 02/19/2020   PAF (paroxysmal atrial fibrillation) (HCC) 02/05/2019   Shortness of breath 01/27/2019   Irregular surface of cornea 12/25/2018   HPV (human papilloma virus) infection 12/01/2016   Screening mammogram, encounter for 11/29/2016   Estrogen deficiency 11/29/2016   Encounter for routine gynecological examination 11/29/2016   Grade III hemorrhoids 10/06/2016   Tachycardia 08/14/2016    Hemorrhoids 08/13/2016   Atypical migraine 08/03/2016   CAD (coronary artery disease)    AAA (abdominal aortic aneurysm) (HCC)    Chronic combined systolic (congestive) and diastolic (congestive) heart failure (HCC)    Prediabetes 02/11/2016   Generalized anxiety disorder 12/08/2015   Panic attacks 07/28/2015   Sensorineural hearing loss 03/27/2013   Vitamin D deficiency 07/03/2012   H/O aortic dissection 05/12/2012   Class 2 obesity due to excess calories with body mass index (BMI) of 35.0 to 35.9 in adult 11/21/2011   Obesity (BMI 30.0-34.9) 11/21/2011   Routine general medical examination at a health care facility 09/20/2011   S/P AVR (aortic valve replacement) 01/07/2011   CORONARY ARTERY BYPASS GRAFT, HX OF 01/07/2011   Arthritis 12/20/2010   Back pain 12/15/2010   Dissection of thoracic aorta (HCC) 12/11/2010   H/O dissecting abdominal aortic aneurysm repair 08/24/2010   IRRITABLE BOWEL SYNDROME 09/09/2009   Obstructive sleep apnea 08/04/2009   External hemorrhoid 06/26/2009   Osteoporosis 01/09/2009   Hyperlipidemia 07/26/2007   Depression with anxiety 07/26/2007   Essential hypertension 07/26/2007   Allergic rhinitis 07/26/2007   Asthma 07/26/2007   GERD 07/26/2007   Osteoarthritis 07/26/2007   Ocular migraine 07/26/2007   Mild intermittent asthma without complication 07/26/2007    Since last visit with CPP, the following interventions have been made: Rachel Torres changed atorvastatin from daily to only taking Mon, Wed and Fri changed due to leg cramps. She states she is still seeing Rachel Quince, MD for weight management and continuing to lose weight. The patient has not had an ED visit since their last CPP follow up. The patients current Chronic and PDC medications are: Atorvastatin 20 mg take 1 on Mon, Wed and Friday and lisinopril 5 mg take 1/2 tablet daily. CPP to review for any gap  greater than 5 days between last fill. The patient has had the following problems  with their health: states she woke up this morning with a sore throat and right ear pain. The patient has not had any problems with their pharmacy. The patient has not had any side effects with their medicine. The patient has no recommendations for improvements in managing care.                              Follow-Up:  Pharmacist Review   Phil Dopp, CPP notified  Jomarie Longs, [facility] Clinical Pharmacy Assistant [phone]  I have reviewed the care management and care coordination activities outlined in this encounter and I am certifying that I agree with the content of this note. Patient does not have > 5 day gap between refills on ACE-I or STATIN. No further action required.  Phil Dopp, PharmD Clinical Pharmacist [facility] [phone]

## 2020-12-30 ENCOUNTER — Encounter (INDEPENDENT_AMBULATORY_CARE_PROVIDER_SITE_OTHER): Payer: Self-pay | Admitting: Family Medicine

## 2020-12-30 ENCOUNTER — Telehealth (INDEPENDENT_AMBULATORY_CARE_PROVIDER_SITE_OTHER): Payer: Medicare HMO | Admitting: Family Medicine

## 2020-12-30 ENCOUNTER — Other Ambulatory Visit: Payer: Self-pay

## 2020-12-30 DIAGNOSIS — E1169 Type 2 diabetes mellitus with other specified complication: Secondary | ICD-10-CM | POA: Diagnosis not present

## 2020-12-30 DIAGNOSIS — E669 Obesity, unspecified: Secondary | ICD-10-CM | POA: Diagnosis not present

## 2020-12-30 DIAGNOSIS — E7849 Other hyperlipidemia: Secondary | ICD-10-CM

## 2020-12-30 DIAGNOSIS — Z6831 Body mass index (BMI) 31.0-31.9, adult: Secondary | ICD-10-CM | POA: Diagnosis not present

## 2020-12-31 ENCOUNTER — Ambulatory Visit (INDEPENDENT_AMBULATORY_CARE_PROVIDER_SITE_OTHER): Payer: Medicare HMO | Admitting: Psychology

## 2020-12-31 DIAGNOSIS — F4323 Adjustment disorder with mixed anxiety and depressed mood: Secondary | ICD-10-CM

## 2020-12-31 NOTE — Progress Notes (Signed)
TeleHealth Visit:  Due to the COVID-19 pandemic, this visit was completed with telemedicine (audio/video) technology to reduce patient and provider exposure as well as to preserve personal protective equipment.   Rachel Torres has verbally consented to this TeleHealth visit. The patient is located at home, the provider is located at the [facility] office. The participants in this visit include the listed provider and patient. The visit was conducted today via MyChart video.   Chief Complaint: OBESITY Rachel Torres is here to discuss her progress with her obesity treatment plan along with follow-up of her obesity related diagnoses. Rachel Torres is on following a lower carbohydrate, vegetable and lean protein rich diet plan and states she is following her eating plan approximately 75% of the time. Rachel Torres states she is doing muscle isolation for 15-20 minutes 7 times per week.  Today's visit was #: 10 Starting weight: 213 lbs Starting date: 05/05/2020  Interim History: Rachel Torres has done well maintaining her weight loss over the holidays. She is doing a lower carbohydrate plan, but she struggles with some hunger and some cravings. She notes increased stress with her husband's health.  Subjective:   1. Type 2 diabetes mellitus with other specified complication, without long-term current use of insulin (HCC) Rachel Torres is diabetes is controlled with diet (low carbohydrate), but she struggles with polyphagia. She has a strong family history of diabetes mellitus, and she is trying to avoid her blood sugars from getting out of control.  2. Other hyperlipidemia Rachel Torres is working on diet and weight loss. She is on Lipitor and no chest pain is noted.  Assessment/Plan:   1. Type 2 diabetes mellitus with other specified complication, without long-term current use of insulin (HCC) Good blood sugar control is important to decrease the likelihood of diabetic complications such as nephropathy, neuropathy,  limb loss, blindness, coronary artery disease, and death. Intensive lifestyle modification including diet, exercise and weight loss are the first line of treatment for diabetes. Rachel Torres agreed to start Ozempic 0.25 mg q weekly with no refills.  2. Other hyperlipidemia Cardiovascular risk and specific lipid/LDL goals reviewed. We discussed several lifestyle modifications today and [name] will continue her diet, exercise and weight loss efforts. We will plan to recheck labs in 1 month. Orders and follow up as documented in patient record.   3. Class 1 obesity with serious comorbidity and body mass index (BMI) of 31.0 to 31.9 in adult, unspecified obesity type Rachel Torres is currently in the action stage of change. As such, her goal is to continue with weight loss efforts. She has agreed to following a lower carbohydrate, vegetable and lean protein rich diet plan.   Exercise goals: As is.  Behavioral modification strategies: meal planning and cooking strategies.  Rachel Torres has agreed to follow-up with our clinic in 4 weeks. She was informed of the importance of frequent follow-up visits to maximize her success with intensive lifestyle modifications for her multiple health conditions.  Objective:   VITALS: Per patient if applicable, see vitals. GENERAL: Alert and in no acute distress. CARDIOPULMONARY: No increased WOB. Speaking in clear sentences.  PSYCH: Pleasant and cooperative. Speech normal rate and rhythm. Affect is appropriate. Insight and judgement are appropriate. Attention is focused, linear, and appropriate.  NEURO: Oriented as arrived to appointment on time with no prompting.   Lab Results  Component Value Date   CREATININE 0.82 10/21/2020   BUN 13 10/21/2020   NA 136 10/21/2020   K 4.3 10/21/2020   CL 100 10/21/2020  CO2 29 10/21/2020   Lab Results  Component Value Date   ALT 22 10/21/2020   AST 20 10/21/2020   ALKPHOS 75 10/21/2020   BILITOT 0.6 10/21/2020   Lab Results   Component Value Date   HGBA1C 5.7 (H) 10/29/2020   HGBA1C 5.6 07/23/2020   HGBA1C 5.9 (H) 05/05/2020   HGBA1C 6.0 06/07/2019   HGBA1C 6.1 06/26/2018   Lab Results  Component Value Date   INSULIN 13.9 10/29/2020   INSULIN 22.5 07/23/2020   INSULIN 25.2 (H) 05/05/2020   Lab Results  Component Value Date   TSH 1.040 05/05/2020   Lab Results  Component Value Date   CHOL 215 (H) 10/29/2020   HDL 65 10/29/2020   LDLCALC 128 (H) 10/29/2020   LDLDIRECT 189.4 10/13/2011   TRIG 124 10/29/2020   CHOLHDL 3 06/07/2019   Lab Results  Component Value Date   WBC 6.0 10/21/2020   HGB 13.6 10/21/2020   HCT 40.3 10/21/2020   MCV 93.6 10/21/2020   PLT 288.0 10/21/2020   Lab Results  Component Value Date   FERRITIN 105.0 12/11/2010    Attestation Statements:   Reviewed by clinician on day of visit: allergies, medications, problem list, medical history, surgical history, family history, social history, and previous encounter notes.   I, Trixie Dredge, am acting as transcriptionist for [name], MD.  I have reviewed the above documentation for accuracy and completeness, and I agree with the above. - [name], MD

## 2021-01-07 MED ORDER — OZEMPIC (0.25 OR 0.5 MG/DOSE) 2 MG/1.5ML ~~LOC~~ SOPN: 0.2500 mg | PEN_INJECTOR | SUBCUTANEOUS | 0 refills | Status: DC

## 2021-01-08 ENCOUNTER — Other Ambulatory Visit: Payer: Self-pay | Admitting: Cardiovascular Disease

## 2021-01-08 NOTE — Telephone Encounter (Signed)
Rx request sent to pharmacy.  

## 2021-01-14 ENCOUNTER — Ambulatory Visit: Payer: Medicare HMO | Admitting: Psychology

## 2021-01-18 ENCOUNTER — Encounter (INDEPENDENT_AMBULATORY_CARE_PROVIDER_SITE_OTHER): Payer: Self-pay | Admitting: Family Medicine

## 2021-01-19 ENCOUNTER — Other Ambulatory Visit (INDEPENDENT_AMBULATORY_CARE_PROVIDER_SITE_OTHER): Payer: Self-pay

## 2021-01-19 DIAGNOSIS — E1169 Type 2 diabetes mellitus with other specified complication: Secondary | ICD-10-CM

## 2021-01-19 MED ORDER — OZEMPIC (0.25 OR 0.5 MG/DOSE) 2 MG/1.5ML ~~LOC~~ SOPN: 0.2500 mg | PEN_INJECTOR | SUBCUTANEOUS | 0 refills | Status: DC

## 2021-01-21 ENCOUNTER — Ambulatory Visit (INDEPENDENT_AMBULATORY_CARE_PROVIDER_SITE_OTHER): Payer: Medicare HMO | Admitting: Psychology

## 2021-01-21 DIAGNOSIS — F4323 Adjustment disorder with mixed anxiety and depressed mood: Secondary | ICD-10-CM

## 2021-01-28 ENCOUNTER — Ambulatory Visit (INDEPENDENT_AMBULATORY_CARE_PROVIDER_SITE_OTHER): Payer: Medicare HMO | Admitting: Psychology

## 2021-01-28 DIAGNOSIS — F4323 Adjustment disorder with mixed anxiety and depressed mood: Secondary | ICD-10-CM | POA: Diagnosis not present

## 2021-02-02 ENCOUNTER — Encounter (INDEPENDENT_AMBULATORY_CARE_PROVIDER_SITE_OTHER): Payer: Self-pay | Admitting: Family Medicine

## 2021-02-02 ENCOUNTER — Other Ambulatory Visit: Payer: Self-pay

## 2021-02-02 ENCOUNTER — Ambulatory Visit (INDEPENDENT_AMBULATORY_CARE_PROVIDER_SITE_OTHER): Payer: Medicare HMO | Admitting: Family Medicine

## 2021-02-02 VITALS — BP 114/72 | HR 81 | Temp 98.1°F | Ht 65.0 in | Wt 190.0 lb

## 2021-02-02 DIAGNOSIS — F439 Reaction to severe stress, unspecified: Secondary | ICD-10-CM

## 2021-02-02 DIAGNOSIS — M25511 Pain in right shoulder: Secondary | ICD-10-CM

## 2021-02-02 DIAGNOSIS — E669 Obesity, unspecified: Secondary | ICD-10-CM

## 2021-02-02 DIAGNOSIS — Z6831 Body mass index (BMI) 31.0-31.9, adult: Secondary | ICD-10-CM | POA: Diagnosis not present

## 2021-02-02 DIAGNOSIS — B372 Candidiasis of skin and nail: Secondary | ICD-10-CM

## 2021-02-02 DIAGNOSIS — K5904 Chronic idiopathic constipation: Secondary | ICD-10-CM | POA: Diagnosis not present

## 2021-02-02 MED ORDER — POLYETHYLENE GLYCOL 3350 17 GM/SCOOP PO POWD: 17.0000 g | Freq: Every day | ORAL | 0 refills | Status: AC

## 2021-02-02 MED ORDER — NYSTATIN 100000 UNIT/GM EX POWD: 1.0000 "application " | Freq: Two times a day (BID) | CUTANEOUS | 0 refills | Status: DC

## 2021-02-03 NOTE — Telephone Encounter (Signed)
Last office visit 07/16/2020 GERD  Last refill 07/17/2020 0 refills

## 2021-02-04 NOTE — Progress Notes (Signed)
Chief Complaint:   OBESITY Rachel Torres is here to discuss her progress with her obesity treatment plan along with follow-up of her obesity related diagnoses. Rachel Torres is on following a lower carbohydrate, vegetable and lean protein rich diet plan and states she is following her eating plan approximately 75% of the time. Rachel Torres states she is doing 0 minutes 0 times per week.  Today's visit was #: 11 Starting weight: 213 lbs Starting date: 05/05/2020 Today's weight: 190 lbs Today's date: 02/02/2021 Total lbs lost to date: 23 Total lbs lost since last in-office visit: 0  Interim History: Rachel Torres has struggled more with following her eating plan due to increased stress at home, and her husband's health and frequent doctor visits. She has done well minimizing her weight gain even when she hasn't been able to follow her plan.  Subjective:   1. 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 She does not have a sports medicine doctor or a recent work up. She did have surgery on the shoulder previously.   2. Stress Rachel Torres notes increased stress at home which makes following her plan more difficult. She appears to be handling the stress reasonably well overall, even though she hasn't been able to concentrate on weight loss as much.  3. Cutaneous candidiasis Rachel Torres has a history of cutaneous candidiasis for which she uses Nystatin powder intermittently. She requests a refill today.  4. Chronic idiopathic constipation Rachel Torres has a long-standing history of constipation, which has not improved with OTC magnesium.   Assessment/Plan:   1. Right shoulder pain, unspecified chronicity We will refer Rachel Torres to Dr. Georgina Snell for evaluation and treatment.  - Ambulatory referral to Sports Medicine  2. Stress Rachel Torres was offered support and encouragement. We may need to refer to therapy if  stress continues at the current level.  3. Cutaneous candidiasis We will refill Nystatin for 1 month, and Rachel Torres will continue to follow up.  - nystatin (MYCOSTATIN/NYSTOP) powder; Apply 1 application topically 2 (two) times daily. Apply to affected area twice a day for 14 days  Dispense: 60 g; Refill: 0  4. Chronic idiopathic constipation Rachel Torres was informed that a decrease in bowel movement frequency is normal while losing weight, but stools should not be hard or painful. Rachel Torres agreed to start miralax OTC 17 g daily, and increase her water intake. Will continue to monitor. Orders and follow up as documented in patient record.   5. Class 1 obesity with serious comorbidity and body mass index (BMI) of 31.0 to 31.9 in adult, unspecified obesity type Rachel Torres is currently in the action stage of change. As such, her goal is to continue with weight loss efforts. She has agreed to following a lower carbohydrate, vegetable and lean protein rich diet plan.   Behavioral modification strategies: increasing lean protein intake.  Rachel Torres has agreed to follow-up with our clinic in 3 to 4 weeks. She was informed of the importance of frequent follow-up visits to maximize her success with intensive lifestyle modifications for her multiple health conditions.   Objective:   Blood pressure 114/72, pulse 81, temperature 98.1 F (36.7 C), height 5\' 5"  (1.651 m), weight 190 lb (86.2 kg), SpO2 97 %. Body mass index is 31.62 kg/m.  General: Cooperative, alert, well developed, in no acute distress. HEENT: Conjunctivae and lids unremarkable. Cardiovascular: Regular rhythm.  Lungs: Normal work of breathing. Neurologic: No focal deficits.   Lab Results  Component Value Date   CREATININE 0.82 10/21/2020   BUN 13 10/21/2020   NA 136 10/21/2020   K 4.3 10/21/2020   CL 100 10/21/2020   CO2 29 10/21/2020   Lab Results  Component Value Date   ALT 22 10/21/2020   AST 20 10/21/2020   ALKPHOS 75 10/21/2020    BILITOT 0.6 10/21/2020   Lab Results  Component Value Date   HGBA1C 5.7 (H) 10/29/2020   HGBA1C 5.6 07/23/2020   HGBA1C 5.9 (H) 05/05/2020   HGBA1C 6.0 06/07/2019   HGBA1C 6.1 06/26/2018   Lab Results  Component Value Date   INSULIN 13.9 10/29/2020   INSULIN 22.5 07/23/2020   INSULIN 25.2 (H) 05/05/2020   Lab Results  Component Value Date   TSH 1.040 05/05/2020   Lab Results  Component Value Date   CHOL 215 (H) 10/29/2020   HDL 65 10/29/2020   LDLCALC 128 (H) 10/29/2020   LDLDIRECT 189.4 10/13/2011   TRIG 124 10/29/2020   CHOLHDL 3 06/07/2019   Lab Results  Component Value Date   WBC 6.0 10/21/2020   HGB 13.6 10/21/2020   HCT 40.3 10/21/2020   MCV 93.6 10/21/2020   PLT 288.0 10/21/2020   Lab Results  Component Value Date   FERRITIN 105.0 12/11/2010    Obesity Behavioral Intervention:   Approximately 15 minutes were spent on the discussion below.  ASK: We discussed the diagnosis of obesity with Rachel Torres today and Rachel Torres agreed to give [place] permission to discuss obesity behavioral modification therapy today.  ASSESS: Rachel Torres has the diagnosis of obesity and her BMI today is 31.62. Rachel Torres is in the action stage of change.   ADVISE: Rachel Torres was educated on the multiple health risks of obesity as well as the benefit of weight loss to improve her health. She was advised of the need for long term treatment and the importance of lifestyle modifications to improve her current health and to decrease her risk of future health problems.  AGREE: Multiple dietary modification options and treatment options were discussed and Rachel Torres agreed to follow the recommendations documented in the above note.  ARRANGE: Rachel Torres was educated on the importance of frequent visits to treat obesity as outlined per CMS and USPSTF guidelines and agreed to schedule her next follow up appointment today.  Attestation Statements:   Reviewed by clinician on day of visit: allergies,  medications, problem list, medical history, surgical history, family history, social history, and previous encounter notes.   I, Trixie Dredge, am acting as transcriptionist for [name], MD.  I have reviewed the above documentation for accuracy and completeness, and I agree with the above. -  [name], MD

## 2021-02-05 NOTE — Progress Notes (Deleted)
   Subjective:    I'm seeing this patient as a consultation for:  Dr. Leafy Ro. Note will be routed back to referring provider/PCP.  CC: R shoulder pain  I, Burnice Oestreicher, LAT, ATC, am serving as scribe for Dr. Lynne Leader.  HPI: Pt is a 65 y/o female presenting w/ c/o chronic R shoulder pain that has worsened over the last ? Weeks/months.  She locates her pain to .  Of note, pt had a prior R shoulder surgery in .  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  Radiating pain: R shoulder mechanical symptoms: Aggravating factors: Treatments tried:   Past medical history, Surgical history, Family history, Social history, Allergies, and medications have been entered into the medical record, reviewed. ***  Review of Systems: No new headache, visual changes, nausea, vomiting, diarrhea, constipation, dizziness, abdominal pain, skin rash, fevers, chills, night sweats, weight loss, swollen lymph nodes, body aches, joint swelling, muscle aches, chest pain, shortness of breath, mood changes, visual or auditory hallucinations.   Objective:   There were no vitals filed for this visit. General: Well Developed, well nourished, and in no acute distress.  Neuro/Psych: Alert and oriented x3, extra-ocular muscles intact, able to move all 4 extremities, sensation grossly intact. Skin: Warm and dry, no rashes noted.  Respiratory: Not using accessory muscles, speaking in full sentences, trachea midline.  Cardiovascular: Pulses palpable, no extremity edema. Abdomen: Does not appear distended. MSK: ***  Lab and Radiology Results No results found for this or any previous visit (from the past 72 hour(s)). No results found.  Impression and Recommendations:    Assessment and Plan: 65 y.o. female with ***.  PDMP not reviewed this encounter. No orders of the defined types were placed in this encounter.  No orders of the defined  types were placed in this encounter.   Discussed warning signs or symptoms. Please see discharge instructions. Patient expresses understanding.   ***

## 2021-02-06 ENCOUNTER — Ambulatory Visit: Payer: TRICARE For Life (TFL) | Admitting: Family Medicine

## 2021-02-06 NOTE — Progress Notes (Signed)
Subjective:   I, Peterson Lombard, LAT, ATC acting as a scribe for Lynne Leader, MD.  I'm seeing this patient as a consultation for Dr. [name]. Note will be routed back to referring provider/PCP.  CC: Right shoulder pain  HPI: Pt is a 65 y/o female c/o chronic R shoulder pain that has worsened over the last 1.5 months due to increased stress to shoulder while physically assisting her husband who has Parkinson's w/ ambulation using a gait belt and lifting his walker in-out of the car. Pt has a PMHx of shoulder surgery in 2009. Today, pt reports pain w/ shoulder flexion and varying shoulder motions. Pt locates pain to anterior aspect of shoulder/bicepital groove and in to [facility] joint.  R shoulder mechanical symptoms: no Radiates: yes UE numbness/tingling: no UE weakness: no Aggravates: lifting her husbands walker in-out of car, driving, washing hair Treatments tried: heat, ice, lidocaine patch, naproxen  Dx imaging: 07/03/08 R shoulder XR  Past medical history, Surgical history, Family history, Social history, Allergies, and medications have been entered into the medical record, reviewed.  Pertinent history of significant shoulder surgeries following a significant fracture years ago.  Review of Systems: No new headache, visual changes, nausea, vomiting, diarrhea, constipation, dizziness, abdominal pain, skin rash, fevers, chills, night sweats, weight loss, swollen lymph nodes, body aches, joint swelling, muscle aches, chest pain, shortness of breath, mood changes, visual or auditory hallucinations.   Objective:    Vitals:   02/09/21 1342  BP: 131/76  Pulse: 73  SpO2: 97%   General: Well Developed, well nourished, and in no acute distress.  Neuro/Psych: Alert and oriented x3, extra-ocular muscles intact, able to move all 4 extremities, sensation grossly intact. Skin: Warm and dry, no rashes noted.  Respiratory: Not using accessory muscles, speaking in full sentences, trachea  midline.  Cardiovascular: Pulses palpable, no extremity edema. Abdomen: Does not appear distended. MSK: Right shoulder mature scar anterior shoulder otherwise normal.  Range of motion intact abduction external and internal rotation some pain with abduction. Strength 4/5 abduction 5/5 internal and external rotation. Positive Hawkins and Neer's test. Mildly positive empty can test. Negative Yergason's and speeds test.  Pulses capillary refill and sensation intact distally.   Lab and Radiology Results  X-ray images right shoulder obtained today personally and independently interpreted Right total shoulder replacement humeral head. No acute fractures visible. Await formal radiology review  Procedure: Real-time Ultrasound Guided Injection of right subacromial bursa Device: Philips Affiniti 50G Images permanently stored and available for review in PACS Ultrasound inspection prior to injection reveals intact appearing rotator cuff tendons Verbal informed consent obtained.  Discussed risks and benefits of procedure. Warned about infection bleeding damage to structures skin hypopigmentation and fat atrophy among others. Patient expresses understanding and agreement Time-out conducted.   Noted no overlying erythema, induration, or other signs of local infection.   Skin prepped in a sterile fashion.   Local anesthesia: Topical Ethyl chloride.   With sterile technique and under real time ultrasound guidance:  40 mg of Kenalog and 2 mL of Marcaine injected into right subacromial bursa. Fluid seen entering the bursa.   Completed without difficulty   Pain moderately resolved suggesting accurate placement of the medication.   Advised to call if fevers/chills, erythema, induration, drainage, or persistent bleeding.   Images permanently stored and available for review in the ultrasound unit.  Impression: Technically successful ultrasound guided injection.       Impression and  Recommendations:    Assessment and Plan: 65  y.o. female with right shoulder pain thought to be related to subacromial bursitis.  No obvious rotator cuff tear seen on ultrasound examination today however that is still a possibility.  Plan for subacromial injection and referral to PT.  Recheck back in about 1 month.  PDMP not reviewed this encounter. Orders Placed This Encounter  Procedures  . [place] LIMITED JOINT SPACE STRUCTURES UP RIGHT(NO LINKED CHARGES)    Standing Status:   Future    Number of Occurrences:   1    Standing Expiration Date:   08/09/2021    Order Specific Question:   Reason for Exam (SYMPTOM  OR DIAGNOSIS REQUIRED)    Answer:   chronic right shoulder pain    Order Specific Question:   Preferred imaging location?    Answer:   [place]  . DG Shoulder Right    Standing Status:   Future    Number of Occurrences:   1    Standing Expiration Date:   02/09/2022    Order Specific Question:   Reason for Exam (SYMPTOM  OR DIAGNOSIS REQUIRED)    Answer:   eval r shoulder    Order Specific Question:   Preferred imaging location?    Answer:   [facility]  . Ambulatory referral to Physical Therapy    Referral Priority:   Routine    Referral Type:   Physical Medicine    Referral Reason:   Specialty Services Required    Requested Specialty:   Physical Therapy   No orders of the defined types were placed in this encounter.   Discussed warning signs or symptoms. Please see discharge instructions. Patient expresses understanding.   The above documentation has been reviewed and is accurate and complete Lynne Leader, M.D.  CC: Dr Leafy Ro

## 2021-02-09 ENCOUNTER — Other Ambulatory Visit: Payer: Self-pay

## 2021-02-09 ENCOUNTER — Ambulatory Visit (INDEPENDENT_AMBULATORY_CARE_PROVIDER_SITE_OTHER): Payer: Medicare HMO

## 2021-02-09 ENCOUNTER — Ambulatory Visit (INDEPENDENT_AMBULATORY_CARE_PROVIDER_SITE_OTHER): Payer: Medicare HMO | Admitting: Family Medicine

## 2021-02-09 ENCOUNTER — Ambulatory Visit: Payer: Self-pay

## 2021-02-09 VITALS — BP 131/76 | HR 73 | Ht 65.0 in | Wt 191.0 lb

## 2021-02-09 DIAGNOSIS — G8929 Other chronic pain: Secondary | ICD-10-CM

## 2021-02-09 DIAGNOSIS — M25511 Pain in right shoulder: Secondary | ICD-10-CM | POA: Diagnosis not present

## 2021-02-09 NOTE — Patient Instructions (Signed)
Thank you for coming in today.  Call or go to the ER if you develop a large red swollen joint with extreme pain or oozing puss.   Please use voltaren gel up to 4x daily for pain as needed.   Please get an Xray today before you leave  I've referred you to Physical Therapy.  Let us know if you don't hear from them in one week.  Recheck in about 1 month.

## 2021-02-10 ENCOUNTER — Encounter: Payer: Self-pay | Admitting: Family Medicine

## 2021-02-10 ENCOUNTER — Encounter (INDEPENDENT_AMBULATORY_CARE_PROVIDER_SITE_OTHER): Payer: Self-pay | Admitting: Family Medicine

## 2021-02-10 NOTE — Progress Notes (Signed)
Right shoulder x-ray no fractures.  Possible hardware loosening is present.  If pain continues to worsen we will proceed with bone scan which would give [place] more information

## 2021-02-10 NOTE — Telephone Encounter (Signed)
Per last GI from 07/16/20 it looks like they were managing her omeprazole, not sure if PCP wants to fill med or have pt f/u with GI regarding fill, will route to PCP for review

## 2021-02-11 ENCOUNTER — Ambulatory Visit: Payer: Medicare HMO | Admitting: Psychology

## 2021-02-11 MED ORDER — OMEPRAZOLE 40 MG PO CPDR: 40.0000 mg | DELAYED_RELEASE_CAPSULE | Freq: Every day | ORAL | 3 refills | Status: DC

## 2021-02-11 NOTE — Telephone Encounter (Signed)
Px pended Please ask what pharmacy and send Thanks

## 2021-02-17 ENCOUNTER — Ambulatory Visit (INDEPENDENT_AMBULATORY_CARE_PROVIDER_SITE_OTHER): Payer: Medicare HMO | Admitting: Psychology

## 2021-02-17 DIAGNOSIS — F4323 Adjustment disorder with mixed anxiety and depressed mood: Secondary | ICD-10-CM

## 2021-02-24 ENCOUNTER — Other Ambulatory Visit: Payer: Self-pay

## 2021-02-24 ENCOUNTER — Ambulatory Visit: Payer: Medicare HMO | Attending: Family Medicine

## 2021-02-24 DIAGNOSIS — M25511 Pain in right shoulder: Secondary | ICD-10-CM | POA: Diagnosis not present

## 2021-02-24 DIAGNOSIS — R293 Abnormal posture: Secondary | ICD-10-CM | POA: Insufficient documentation

## 2021-02-24 DIAGNOSIS — G8929 Other chronic pain: Secondary | ICD-10-CM | POA: Insufficient documentation

## 2021-02-24 DIAGNOSIS — M6281 Muscle weakness (generalized): Secondary | ICD-10-CM | POA: Diagnosis not present

## 2021-02-24 NOTE — Therapy (Signed)
[place] MAIN [facility] SERVICES [address] [place], [place], [address] Phone: [phone]   Fax:  [phone]  Physical Therapy Evaluation  Patient Details  Name: Rachel Torres MRN: [id number] Date of Birth: [birth year] Referring Provider (PT): Gregor Hams, MD   Encounter Date: 02/24/2021   PT End of Session - 02/24/21 1928    Visit Number 1    Number of Visits 17    Date for PT Re-Evaluation 04/21/21    Authorization Type Eval performed 4/0/1027 (recert for 01/24/3663)    PT Start Time 1419    PT Stop Time 1514    PT Time Calculation (min) 55 min    Activity Tolerance Patient tolerated treatment well    Behavior During Therapy [facility] for tasks assessed/performed           Past Medical History:  Diagnosis Date  . AAA (abdominal aortic aneurysm) ([place])    a. Chronic w/o evidence of aneurysmal dil on CTA 01/2016.  Marland Kitchen Alcohol abuse   . Allergy   . Anemia   . Anxiety   . CAD (coronary artery disease)    a. 08/2010 s/p CABG x 1 (VG->RCA) @ time of Ao dissection repair; b. 01/2016 Lexiscan MV: mid antsept/apical defect w/ ? peri-infarct ischemia-->likely attenuation-->Med Rx.  . Chewing difficulty   . Chronic combined systolic (congestive) and diastolic (congestive) heart failure ([place])    a. 2012 EF 30-35%; b. 04/2015 EF 40-45%; c. 01/2016 Echo: Ef 55-65%, nl AoV bioprosthesis, nl RV, nl PASP.  Marland Kitchen Constipation   . Depression   . Edema of both lower extremities   . Gallbladder sludge   . GERD (gastroesophageal reflux disease)   . Heart failure ([place])   . Heart valve problem   . History of Bicuspid Aortic Valve    a. 08/2010 s/p AVR @ time of Ao dissection repair; b. 01/2016 Echo: Ef 55-65%, nl AoV bioprosthesis, nl RV, nl PASP.  Marland Kitchen Hx of repair of dissecting thoracic aortic aneurysm, [place] type A    a. 08/2010 s/p repair with AVR and VG->RCA; b. 01/2016 CTA: stable appearance of Asc Thoracic Aortic graft. Opacification of flase lumen of chronic  abd Ao dissection w/ retrograde flow through lumbar arteries. No aneurysmal dil of flase lumen.  . Hyperlipidemia   . Hypertensive heart disease   . IBS (irritable bowel syndrome)   . Internal hemorrhoids   . Kidney problem   . Marfan syndrome    pt denies  . Migraine   . Obstructive sleep apnea   . Osteoarthritis   . Osteoporosis   . Palpitations   . Pneumonia   . RA (rheumatoid arthritis) ([place])    a. Followed by Dr. [name]    Past Surgical History:  Procedure Laterality Date  . ABDOMINAL AORTIC ANEURYSM REPAIR    . CARDIAC CATHETERIZATION  2001  . [place]  . CHOLECYSTECTOMY    . COLONOSCOPY    . ESOPHAGOGASTRODUODENOSCOPY (EGD) WITH PROPOFOL N/A 05/08/2020   Procedure: ESOPHAGOGASTRODUODENOSCOPY (EGD) WITH PROPOFOL;  Surgeon: Virgel Manifold, MD;  Location: [facility] ENDOSCOPY;  Service: Endoscopy;  Laterality: N/A;  . FRACTURE SURGERY Right    shoulder replacement  . HEMORRHOID BANDING    . ORIF DISTAL RADIUS FRACTURE Left   . UPPER GASTROINTESTINAL ENDOSCOPY      There were no vitals filed for this visit.    Subjective Assessment - 02/24/21 1420    Subjective She states that her orthopedic  doctor thinks a component of her R shoulder replacement might be loose. Pt says she is primarily L handed but is "probably ambidextrous." She reports she had cortisone shot to R shoulder but that it "didn't help"    Pertinent History Pt is a 65 y/o female presenting to therapy due to R shoulder pain. Pt had R shoulder replacement 12-13 years ago.  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 She says shoulder pain started this past January as she was helping lift her husband and his assistive devices. She reports she has since stopped lifting him and his walker as they have caregivers who come to the home for 16 hours/week and can help. Pt is most concerned that her shoulder hurts when she drives and washes her hair. Her  shoulder does not hurt constantly, but she states she has pain "the more I use it." She feels a catch and stabbing sensation in R anterior shoulder that can travel into bicep and that sometimes the pain is a burning sensation. She sometimes has pain in L shoulder.  She rates her worst R shoulder pain as 10/10, best pain is 0/10. Her R shoulder pain is currently 1.5/10. She reports that rest, ice and naproxen help with pain relief. She denies redness/swelling/temp difference and N/T in UEs.  Patient has been seen by other therapists in this clinic previously for cervicalgia and back related injuries/pain. Pt PMH includes COVID19, upper GI stomach issues, aorta type A dissection (2011) with aortic valve replacement, bypass of R coronary artery, HTN, hyperlipidemia, pre-diabetes, AFIB, CHF, depression, anxiety, OA, GERD, atypical migraine, asthma, and allergic rhinitis.    Limitations Lifting;House hold activities;Other (comment)   driving   How long can you sit comfortably? Not affected    How long can you stand comfortably? Not affected    How long can you walk comfortably? Pt reports she has pain only if she is carrying something    Diagnostic tests DG R shoulder 02/09/2021 per chart "Impression: Possible hardware loosening post total shoulder arthroplasty. No  evidence of acute fracture or dislocation"    Patient Stated Goals Be able to drive without pain    Currently in Pain? Yes    Pain Score 1     Pain Location Shoulder    Pain Orientation Right    Pain Descriptors / Indicators Sharp;Burning    Pain Type Chronic pain    Pain Radiating Towards doesn't radiate    Pain Onset More than a month ago    Pain Frequency Intermittent    Aggravating Factors  prolonged activity    Pain Relieving Factors She reports that rest, ice and naproxen help with pain relief.    Effect of Pain on Daily Activities Limiting ADLs, caregiving, and most concerning for pt, her ability to drive.    Multiple Pain Sites No               [facility] PT Assessment - 02/24/21 1430      Assessment   Medical Diagnosis Chronic R shoulder pain    Referring Provider (PT) Gregor Hams, MD    Onset Date/Surgical Date 12/20/20    Hand Dominance Left    Next MD Visit March 22nd    Prior Therapy Prior PT for back, neck, shoulder pain      Precautions   Precautions None      Restrictions   Weight Bearing Restrictions No      Balance  Screen   Has the patient fallen in the past 6 months No    Has the patient had a decrease in activity level because of a fear of falling?  No    Is the patient reluctant to leave their home because of a fear of falling?  No      Home Social worker Private residence    Living Arrangements Parent;Spouse/significant other    Available Help at Discharge Personal care attendant    Type of [place] entrance;Stairs to enter    Entrance Stairs-Number of Steps 4    Entrance Stairs-[place] One level    Home Equipment Wheelchair - power;Wheelchair - [employer] - 4 wheels;Cane - single point;Shower seat;Toilet riser;Grab bars - tub/shower;Grab bars - toilet      Prior Function   Level of Independence Independent    Vocation On disability    Vocation Requirements fulltime caregiver    Leisure shop      Cognition   Overall Cognitive Status Within Functional Limits for tasks assessed           Objective measurements completed on examination: See below findings.   FOTO: 55 (target 60)  Quick Dash: Disability/symtpome score 40, work score 37.5   PAIN: worst 10/10, best 0/10, currently 1.5/10  Palpation: pain over bicipital groove.   POSTURE: slight forward head posture and thoracic kyphosis, elevated shoulders B  PROM/AROM: No pain with AROM B, all UE motions WFL limits 3/10 pain with overpressure with R UE flexion, and pt states "slight" pain with overpressure with R shoulder abduction.   STRENGTH:  Graded  on a 0-5 scale Muscle Group Left Right  Shoulder flex 4/5 3+/5* clunk felt/audible pop; 3/10 pain  Shoulder Abd 4/5 4/5  Shoulder Ext 4+/5 4+/5  Shoulder IR/ER 4/5 grossly 4/5* grossly both  Elbow 5/5 5/5  Wrist/hand 5/5 5/5   Grip strength WNL B Finger abduciton/adduction WNL B   SENSATION: WNL in BUEs NO N/T  Cervical ROM Cervical ext impaired: 50 deg, no pain Lateral rotation WFL B, no pain Cervical flexion WFL, no pain Cervical lateral flexion impaired: R 35 deg, L 40 deg. No pain with movement.  Coordination: WNL  SPECIAL TESTS: Painful arc: negative B Hawkins-kennedy: negative B Yergason's test: negative R  O'brien's Test: weak on R UE, unable to fully perform. Pt states she "doesn't let it get to point of pain."  Treatment- Demonstration, VC/TC provided for all of the following:  Seated YTB Rows 20x. Educated pt on technique, frequency, sets, reps, and to progress to using RTB at home as YTB is too easy. Instructed pt only to use YTB if shoulder is increasingly painful.  RTB shoulder ER pull-aparts 20x.  Upper trap stretch B - 1x30 sec each side.   Provided pt with handout, with education on frequency, sets and reps for all of the above exercises, including the importance of maintaining stretch for at least 30 sec B for two sets. Instructed pt on resistance modification if shoulder increasingly painful as well.   Assessment: Pt is a pleasant 65 yo female referred to physical therapy for chronic right shoulder pain. Pt presents with impairments of pain, decreased strength of BUEs with slight increase in deficit on RUE compared to LUE, cervical ROM deficits, and moderate R shoulder disability as indicated by Quick Dash score of 40 (work score 37.5). The patient's deficits are currently impacting her ability to perform  ADLs and caregiving activities. The pt will benefit from further skilled therapy to improve pain, UE strength, ROM, and mobility in order to improve ease  with ADLs to increase QOL.   Visit Diagnosis: Chronic right shoulder pain  Muscle weakness (generalized)  Abnormal posture   Problem List Patient Active Problem List   Diagnosis Date Noted  . Abdominal pain, right lateral 10/21/2020  . Acute bronchitis with bronchospasm 05/28/2020  . Diarrhea 04/11/2020  . Epigastric pain 02/19/2020  . Dark stools 02/19/2020  . PAF (paroxysmal atrial fibrillation) ([place]) 02/05/2019  . Shortness of breath 01/27/2019  . Irregular surface of cornea 12/25/2018  . HPV (human papilloma virus) infection 12/01/2016  . Screening mammogram, encounter for 11/29/2016  . Estrogen deficiency 11/29/2016  . Encounter for routine gynecological examination 11/29/2016  . Grade III hemorrhoids 10/06/2016  . Tachycardia 08/14/2016  . Hemorrhoids 08/13/2016  . Atypical migraine 08/03/2016  . CAD (coronary artery disease)   . AAA (abdominal aortic aneurysm) ([place])   . Chronic combined systolic (congestive) and diastolic (congestive) heart failure ([place])   . Prediabetes 02/11/2016  . Generalized anxiety disorder 12/08/2015  . Panic attacks 07/28/2015  . Sensorineural hearing loss 03/27/2013  . Vitamin D deficiency 07/03/2012  . H/O aortic dissection 05/12/2012  . Class 2 obesity due to excess calories with body mass index (BMI) of 35.0 to 35.9 in adult 11/21/2011  . Obesity (BMI 30.0-34.9) 11/21/2011  . Routine general medical examination at a health care facility 09/20/2011  . S/P AVR (aortic valve replacement) 01/07/2011  . CORONARY ARTERY BYPASS GRAFT, HX OF 01/07/2011  . Arthritis 12/20/2010  . Back pain 12/15/2010  . Dissection of thoracic aorta ([place]) 12/11/2010  . H/O dissecting abdominal aortic aneurysm repair 08/24/2010  . IRRITABLE BOWEL SYNDROME 09/09/2009  . Obstructive sleep apnea 08/04/2009  . External hemorrhoid 06/26/2009  . Osteoporosis 01/09/2009  . Hyperlipidemia 07/26/2007  . Depression with anxiety 07/26/2007  . Essential hypertension  07/26/2007  . Allergic rhinitis 07/26/2007  . Asthma 07/26/2007  . GERD 07/26/2007  . Osteoarthritis 07/26/2007  . Ocular migraine 07/26/2007  . Mild intermittent asthma without complication 28/41/3244   Ricard Dillon PT, DPT 02/24/2021, 8:32 PM  [place] MAIN [facility] SERVICES [address] [place], [place], [address] Phone: [phone]   Fax:  [phone]  Name: Rachel Torres MRN: [id number] Date of Birth: Nov 20, 1956

## 2021-02-25 ENCOUNTER — Ambulatory Visit (INDEPENDENT_AMBULATORY_CARE_PROVIDER_SITE_OTHER): Payer: Medicare HMO | Admitting: Psychology

## 2021-02-25 DIAGNOSIS — F4323 Adjustment disorder with mixed anxiety and depressed mood: Secondary | ICD-10-CM

## 2021-02-26 ENCOUNTER — Other Ambulatory Visit: Payer: Self-pay

## 2021-02-26 ENCOUNTER — Ambulatory Visit (INDEPENDENT_AMBULATORY_CARE_PROVIDER_SITE_OTHER): Payer: Medicare HMO | Admitting: Physician Assistant

## 2021-02-26 ENCOUNTER — Encounter (INDEPENDENT_AMBULATORY_CARE_PROVIDER_SITE_OTHER): Payer: Self-pay | Admitting: Physician Assistant

## 2021-02-26 VITALS — BP 123/72 | HR 69 | Temp 97.7°F | Ht 65.0 in | Wt 185.0 lb

## 2021-02-26 DIAGNOSIS — Z6831 Body mass index (BMI) 31.0-31.9, adult: Secondary | ICD-10-CM | POA: Diagnosis not present

## 2021-02-26 DIAGNOSIS — E669 Obesity, unspecified: Secondary | ICD-10-CM

## 2021-02-26 DIAGNOSIS — R7303 Prediabetes: Secondary | ICD-10-CM | POA: Diagnosis not present

## 2021-02-26 DIAGNOSIS — E559 Vitamin D deficiency, unspecified: Secondary | ICD-10-CM | POA: Diagnosis not present

## 2021-02-26 DIAGNOSIS — E7849 Other hyperlipidemia: Secondary | ICD-10-CM

## 2021-02-26 MED ORDER — OZEMPIC (0.25 OR 0.5 MG/DOSE) 2 MG/1.5ML ~~LOC~~ SOPN
0.2500 mg | PEN_INJECTOR | SUBCUTANEOUS | 0 refills | Status: DC
Start: 2021-02-26 — End: 2021-03-17

## 2021-02-27 ENCOUNTER — Ambulatory Visit: Payer: Medicare HMO

## 2021-02-27 LAB — COMPREHENSIVE METABOLIC PANEL
ALT: 18 IU/L (ref 0–32)
AST: 18 IU/L (ref 0–40)
Albumin/Globulin Ratio: 1.5 (ref 1.2–2.2)
Albumin: 4.6 g/dL (ref 3.8–4.8)
Alkaline Phosphatase: 75 IU/L (ref 44–121)
BUN/Creatinine Ratio: 18 (ref 12–28)
BUN: 16 mg/dL (ref 8–27)
Bilirubin Total: 0.5 mg/dL (ref 0.0–1.2)
CO2: 21 mmol/L (ref 20–29)
Calcium: 9.5 mg/dL (ref 8.7–10.3)
Chloride: 100 mmol/L (ref 96–106)
Creatinine, Ser: 0.91 mg/dL (ref 0.57–1.00)
Globulin, Total: 3 g/dL (ref 1.5–4.5)
Glucose: 97 mg/dL (ref 65–99)
Potassium: 4.7 mmol/L (ref 3.5–5.2)
Sodium: 137 mmol/L (ref 134–144)
Total Protein: 7.6 g/dL (ref 6.0–8.5)
eGFR: 71 mL/min/{1.73_m2} (ref >59)

## 2021-02-27 LAB — INSULIN, RANDOM: INSULIN: 17.3 u[IU]/mL (ref 2.6–24.9)

## 2021-02-27 LAB — LIPID PANEL
Chol/HDL Ratio: 3.4 ratio (ref 0.0–4.4)
Cholesterol, Total: 219 mg/dL — ABNORMAL HIGH (ref 100–199)
HDL: 64 mg/dL (ref >39)
LDL Chol Calc (NIH): 141 mg/dL — ABNORMAL HIGH (ref 0–99)
Triglycerides: 77 mg/dL (ref 0–149)
VLDL Cholesterol Cal: 14 mg/dL (ref 5–40)

## 2021-02-27 LAB — VITAMIN D 25 HYDROXY (VIT D DEFICIENCY, FRACTURES): Vit D, 25-Hydroxy: 62.2 ng/mL (ref 30.0–100.0)

## 2021-02-27 LAB — HEMOGLOBIN A1C
Est. average glucose Bld gHb Est-mCnc: 111 mg/dL
Hgb A1c MFr Bld: 5.5 % (ref 4.8–5.6)

## 2021-03-03 ENCOUNTER — Ambulatory Visit: Payer: Medicare HMO

## 2021-03-04 NOTE — Progress Notes (Signed)
Chief Complaint:   OBESITY Opie is here to discuss her progress with her obesity treatment plan along with follow-up of her obesity related diagnoses. Abri is on following a lower carbohydrate, vegetable and lean protein rich diet plan and states she is following her eating plan approximately 80% of the time. Kawthar states she is doing physical therapy for 45 minutes 2 times per week.  Today's visit was #: 12 Starting weight: 213 lbs Starting date: 05/05/2020 Today's weight: 185 lbs Today's date: 02/26/2021 Total lbs lost to date: 28 Total lbs lost since last in-office visit: 5  Interim History: Chrysta did very well with weight loss. She is doing her "own version" of the low carbohydrate plan. She is eating low carbohydrate options such as tortillas. She is snacking on almonds. She is very stressed with life situations.  Subjective:   1. Pre-diabetes Blessings notes Ozempic is helping her portion control. She denies nausea, vomiting, or diarrhea.  2. Other hyperlipidemia Dennette is on Lipitor, and she is tolerating it well. She is due for labs.  3. Vitamin D deficiency Wendy is on Vit D OTC, and her last level was at goal.  Assessment/Plan:   1. Pre-diabetes Zaryia will continue to work on weight loss, exercise, and decreasing simple carbohydrates to help decrease the risk of diabetes. We will check labs today, and we will refill Ozempic for 1 month.  - Semaglutide,0.25 or 0.5MG /DOS, (OZEMPIC, 0.25 OR 0.5 MG/DOSE,) 2 MG/1.5ML SOPN; Inject 0.25 mg into the skin once a week.  Dispense: 1 mL; Refill: 0 - Comprehensive metabolic panel - Hemoglobin A1c - Insulin, random  2. Other hyperlipidemia Cardiovascular risk and specific lipid/LDL goals reviewed. We discussed several lifestyle modifications today. We will check labs today. Jazarah will continue her medication, and will continue to work on diet, exercise and weight loss efforts. Orders and follow up as documented  in patient record.   Counseling Intensive lifestyle modifications are the first line treatment for this issue. . Dietary changes: Increase soluble fiber. Decrease simple carbohydrates. . Exercise changes: Moderate to vigorous-intensity aerobic activity 150 minutes per week if tolerated. . Lipid-lowering medications: see documented in medical record.  - Lipid panel  3. Vitamin D deficiency Low Vitamin D level contributes to fatigue and are associated with obesity, breast, and colon cancer. Jillianna agreed to continue taking OTC Vitamin D, and we will check labs today. She will follow-up for routine testing of Vitamin D, at least 2-3 times per year to avoid over-replacement.  - VITAMIN D 25 Hydroxy (Vit-D Deficiency, Fractures)  4. Class 1 obesity with serious comorbidity and body mass index (BMI) of 31.0 to 31.9 in adult, unspecified obesity type Kylieann is currently in the action stage of change. As such, her goal is to continue with weight loss efforts. She has agreed to following a lower carbohydrate, vegetable and lean protein rich diet plan.   Exercise goals: As is.  Behavioral modification strategies: meal planning and cooking strategies.  Cindra has agreed to follow-up with our clinic in 4 weeks. She was informed of the importance of frequent follow-up visits to maximize her success with intensive lifestyle modifications for her multiple health conditions.   Whitley was informed we would discuss her lab results at her next visit unless there is a critical issue that needs to be addressed sooner. Hadiya agreed to keep her next visit at the agreed upon time to discuss these results.  Objective:   Blood pressure 123/72, pulse 69, temperature 97.7  F (36.5 C), height 5\' 5"  (1.651 m), weight 185 lb (83.9 kg), SpO2 97 %. Body mass index is 30.79 kg/m.  General: Cooperative, alert, well developed, in no acute distress. HEENT: Conjunctivae and lids unremarkable. Cardiovascular:  Regular rhythm.  Lungs: Normal work of breathing. Neurologic: No focal deficits.   Lab Results  Component Value Date   CREATININE 0.91 02/26/2021   BUN 16 02/26/2021   NA 137 02/26/2021   K 4.7 02/26/2021   CL 100 02/26/2021   CO2 21 02/26/2021   Lab Results  Component Value Date   ALT 18 02/26/2021   AST 18 02/26/2021   ALKPHOS 75 02/26/2021   BILITOT 0.5 02/26/2021   Lab Results  Component Value Date   HGBA1C 5.5 02/26/2021   HGBA1C 5.7 (H) 10/29/2020   HGBA1C 5.6 07/23/2020   HGBA1C 5.9 (H) 05/05/2020   HGBA1C 6.0 06/07/2019   Lab Results  Component Value Date   INSULIN 17.3 02/26/2021   INSULIN 13.9 10/29/2020   INSULIN 22.5 07/23/2020   INSULIN 25.2 (H) 05/05/2020   Lab Results  Component Value Date   TSH 1.040 05/05/2020   Lab Results  Component Value Date   CHOL 219 (H) 02/26/2021   HDL 64 02/26/2021   LDLCALC 141 (H) 02/26/2021   LDLDIRECT 189.4 10/13/2011   TRIG 77 02/26/2021   CHOLHDL 3.4 02/26/2021   Lab Results  Component Value Date   WBC 6.0 10/21/2020   HGB 13.6 10/21/2020   HCT 40.3 10/21/2020   MCV 93.6 10/21/2020   PLT 288.0 10/21/2020   Lab Results  Component Value Date   FERRITIN 105.0 12/11/2010    Obesity Behavioral Intervention:   Approximately 15 minutes were spent on the discussion below.  ASK: We discussed the diagnosis of obesity with Sumaiya today and Jleigh agreed to give [place] permission to discuss obesity behavioral modification therapy today.  ASSESS: Hollynn has the diagnosis of obesity and her BMI today is 30.79. Evana is in the action stage of change.   ADVISE: Chabeli was educated on the multiple health risks of obesity as well as the benefit of weight loss to improve her health. She was advised of the need for long term treatment and the importance of lifestyle modifications to improve her current health and to decrease her risk of future health problems.  AGREE: Multiple dietary modification options and  treatment options were discussed and Kolina agreed to follow the recommendations documented in the above note.  ARRANGE: Rayven was educated on the importance of frequent visits to treat obesity as outlined per CMS and USPSTF guidelines and agreed to schedule her next follow up appointment today.  Attestation Statements:   Reviewed by clinician on day of visit: allergies, medications, problem list, medical history, surgical history, family history, social history, and previous encounter notes.   Wilhemena Durie, am acting as transcriptionist for [employer], PA-C.  I have reviewed the above documentation for accuracy and completeness, and I agree with the above. Abby Potash, PA-C

## 2021-03-05 ENCOUNTER — Encounter (INDEPENDENT_AMBULATORY_CARE_PROVIDER_SITE_OTHER): Payer: Self-pay | Admitting: Physician Assistant

## 2021-03-05 NOTE — Telephone Encounter (Signed)
Last seen by Tracey Aguilar, PA-C. 

## 2021-03-06 ENCOUNTER — Other Ambulatory Visit: Payer: Self-pay

## 2021-03-06 ENCOUNTER — Ambulatory Visit: Payer: Medicare HMO

## 2021-03-06 DIAGNOSIS — M6281 Muscle weakness (generalized): Secondary | ICD-10-CM

## 2021-03-06 DIAGNOSIS — G8929 Other chronic pain: Secondary | ICD-10-CM | POA: Diagnosis not present

## 2021-03-06 DIAGNOSIS — M25511 Pain in right shoulder: Secondary | ICD-10-CM | POA: Diagnosis not present

## 2021-03-06 DIAGNOSIS — R293 Abnormal posture: Secondary | ICD-10-CM | POA: Diagnosis not present

## 2021-03-06 NOTE — Therapy (Signed)
[place] MAIN [facility] SERVICES [address] [place], [place], [address] Phone: [phone]   Fax:  [phone]  Physical Therapy Treatment  Patient Details  Name: Rachel Torres MRN: [id number] Date of Birth: 25-Dec-1955 Referring Provider (PT): Gregor Hams, MD   Encounter Date: 03/06/2021   PT End of Session - 03/06/21 1116    Visit Number 2    Number of Visits 17    Date for PT Re-Evaluation 04/21/21    Authorization Type Eval performed 03/25/2702 (recert for 5/0/0938)    PT Start Time 0829    PT Stop Time 0913    PT Time Calculation (min) 44 min    Activity Tolerance Patient tolerated treatment well    Behavior During Therapy [facility] for tasks assessed/performed           Past Medical History:  Diagnosis Date  . AAA (abdominal aortic aneurysm) ([place])    a. Chronic w/o evidence of aneurysmal dil on CTA 01/2016.  Marland Kitchen Alcohol abuse   . Allergy   . Anemia   . Anxiety   . CAD (coronary artery disease)    a. 08/2010 s/p CABG x 1 (VG->RCA) @ time of Ao dissection repair; b. 01/2016 Lexiscan MV: mid antsept/apical defect w/ ? peri-infarct ischemia-->likely attenuation-->Med Rx.  . Chewing difficulty   . Chronic combined systolic (congestive) and diastolic (congestive) heart failure ([place])    a. 2012 EF 30-35%; b. 04/2015 EF 40-45%; c. 01/2016 Echo: Ef 55-65%, nl AoV bioprosthesis, nl RV, nl PASP.  Marland Kitchen Constipation   . Depression   . Edema of both lower extremities   . Gallbladder sludge   . GERD (gastroesophageal reflux disease)   . Heart failure ([place])   . Heart valve problem   . History of Bicuspid Aortic Valve    a. 08/2010 s/p AVR @ time of Ao dissection repair; b. 01/2016 Echo: Ef 55-65%, nl AoV bioprosthesis, nl RV, nl PASP.  Marland Kitchen Hx of repair of dissecting thoracic aortic aneurysm, [place] type A    a. 08/2010 s/p repair with AVR and VG->RCA; b. 01/2016 CTA: stable appearance of Asc Thoracic Aortic graft. Opacification of flase lumen of chronic  abd Ao dissection w/ retrograde flow through lumbar arteries. No aneurysmal dil of flase lumen.  . Hyperlipidemia   . Hypertensive heart disease   . IBS (irritable bowel syndrome)   . Internal hemorrhoids   . Kidney problem   . Marfan syndrome    pt denies  . Migraine   . Obstructive sleep apnea   . Osteoarthritis   . Osteoporosis   . Palpitations   . Pneumonia   . RA (rheumatoid arthritis) ([place])    a. Followed by Dr. [name]    Past Surgical History:  Procedure Laterality Date  . ABDOMINAL AORTIC ANEURYSM REPAIR    . CARDIAC CATHETERIZATION  2001  . [place]  . CHOLECYSTECTOMY    . COLONOSCOPY    . ESOPHAGOGASTRODUODENOSCOPY (EGD) WITH PROPOFOL N/A 05/08/2020   Procedure: ESOPHAGOGASTRODUODENOSCOPY (EGD) WITH PROPOFOL;  Surgeon: Virgel Manifold, MD;  Location: [facility] ENDOSCOPY;  Service: Endoscopy;  Laterality: N/A;  . FRACTURE SURGERY Right    shoulder replacement  . HEMORRHOID BANDING    . ORIF DISTAL RADIUS FRACTURE Left   . UPPER GASTROINTESTINAL ENDOSCOPY      There were no vitals filed for this visit.   Subjective Assessment - 03/06/21 0824    Subjective Pt reports she has not been  lifting her spouse's walker in out of car so she says that has improved her shoulder sx. She is still having greatest difficulty washing her hair. Pt reports no pain with driving yesterday. Pt has 1/10 R shoulder pain currently and a migraine today.    Pertinent History Pt is a 65 y/o female presenting to therapy due to R shoulder pain. Pt had R shoulder replacement 12-13 years ago.  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 She says shoulder pain started this past January as she was helping lift her husband and his assistive devices. She reports she has since stopped lifting him and his walker as they have caregivers who come to the home for 16 hours/week and can help. Pt is most concerned that her shoulder hurts when she  drives and washes her hair. Her shoulder does not hurt constantly, but she states she has pain "the more I use it." She feels a catch and stabbing sensation in R anterior shoulder that can travel into bicep and that sometimes the pain is a burning sensation. She sometimes has pain in L shoulder.  She rates her worst R shoulder pain as 10/10, best pain is 0/10. Her R shoulder pain is currently 1.5/10. She reports that rest, ice and naproxen help with pain relief. She denies redness/swelling/temp difference and N/T in UEs.  Patient has been seen by other therapists in this clinic previously for cervicalgia and back related injuries/pain. Pt PMH includes COVID19, upper GI stomach issues, aorta type A dissection (2011) with aortic valve replacement, bypass of R coronary artery, HTN, hyperlipidemia, pre-diabetes, AFIB, CHF, depression, anxiety, OA, GERD, atypical migraine, asthma, and allergic rhinitis.    Limitations Lifting;House hold activities;Other (comment)   driving   How long can you sit comfortably? Not affected    How long can you stand comfortably? Not affected    How long can you walk comfortably? Pt reports she has pain only if she is carrying something    Diagnostic tests DG R shoulder 02/09/2021 per chart "Impression: Possible hardware loosening post total shoulder arthroplasty. No  evidence of acute fracture or dislocation"    Patient Stated Goals Be able to drive without pain    Currently in Pain? Yes    Pain Score 1     Pain Location Shoulder    Pain Orientation Right    Pain Onset More than a month ago         TREATMENT:  Therex Matrix 7.5# rows 1x12 seated, 2x12 standing; VC/TC for technique Matrix 2.5# standing IR of L shoulder 3x12; Demonstration/VC for technique Matrix 2.5# L shoulder  ER x10 RTB L shoulder ER 2x10 Seated RTB R shoulder IR x10; pt rates "hard" Seated YTB R shoulder IR 2x10; pt rates "medium"  Seated YTB R shoulder ER 1x7, 2x10 Matrix 2.5# shoulder extension  2x12, without weight 1x12 YTB shoulder abduction 2x8, 1x6 R, L 3x8  Seated thoracic extension with towel roll placed at back - 2x10 with 3 sec hold    Education provided throughout session via VC/TC and demonstration to facilitate movement at target joints and correct muscle activation for all performed. Patient able to follow cues and return demonstration with good carryover.  Assessment: Pt tolerates all therex well without pain. Pt able to perform resisted exercises on Matrix with L shoulder, but unable to on R due to decreased strength. Pt performed R shoulder therex with bands, but still through limited ROM and  with quicker onset of fatigue compared to L UE. Pt will continue to benefit from further skilled therapy to improve pain, mobility, and UE strength to increase ease with ADLs.   Plan - 03/06/21 1120    Clinical Impression Statement Pt tolerates all therex well without pain. Pt able to perform resisted exercises on Matrix with L shoulder, but unable to on R due to decreased strength. Pt performed R shoulder therex with bands, but still through limited ROM and with quicker onset of fatigue compared to L UE. Pt will continue to benefit from further skilled therapy to improve pain, mobility, and UE strength to increase ease with ADLs.    Personal Factors and Comorbidities Comorbidity 1;Comorbidity 2;Comorbidity 3+;Past/Current Experience;Social Background;Time since onset of injury/illness/exacerbation;Transportation;Fitness;Other    Comorbidities Pertinent: recent hx of COVID19, HTN, hyperlipidemia, pre-diabetes, AFIB, CHF, depression, anxiety, OA, atypical migraine, asthma.    Examination-Activity Limitations Bathing;Bed Mobility;Hygiene/Grooming;Lift;Caring for Others;Reach Overhead;Carry;Dressing;Sleep;Other    Examination-Participation Restrictions Laundry;Cleaning;Driving;Community Activity;Other;Yard Work;Shop;Meal Prep;Volunteer    Stability/Clinical Decision Making  Stable/Uncomplicated    Rehab Potential Good    PT Frequency 2x / week    PT Duration 8 weeks    PT Treatment/Interventions ADLs/Self Care Home Management;Biofeedback;Cryotherapy;Electrical Stimulation;Moist Heat;Traction;Ultrasound;DME Instruction;Functional mobility training;Therapeutic activities;Therapeutic exercise;Neuromuscular re-education;Patient/family education;Orthotic Fit/Training;Manual techniques;Scar mobilization;Passive range of motion;Dry needling;Taping;Energy conservation;Joint Manipulations;Spinal Manipulations;Gait training    PT Next Visit Plan Progress shoulder strengthening exercises; cervical stretches within pain tolerance    PT Home Exercise Plan Issued HEP with handout: RTB rows, RTB shoulder ER pull-aparts, and upper trap stretch    Consulted and Agree with Plan of Care Patient             PT Education - 03/06/21 1115    Education Details Pt educated on technique with shoulder IR/ER exercises, body mechanics    Person(s) Educated Patient    Methods Explanation;Demonstration;Verbal cues;Tactile cues    Comprehension Verbalized understanding;Returned demonstration            PT Short Term Goals - 02/24/21 1900      PT SHORT TERM GOAL #1   Title Patient will be independent in home exercise program to improve strength/mobility for better functional independence with ADLs.    Baseline 02/24/2021 Pt issued HEP    Time 4    Period Weeks    Status New    Target Date 03/24/21             PT Long Term Goals - 02/24/21 1842      PT LONG TERM GOAL #1   Title Patient will report a worst pain of 3/10  in last 7 days in her R shoulder to improve tolerance with ADLs and reduced symptoms with activities.    Baseline 02/24/2021: Pt reports worst pain as 10/10.    Time 8    Period Weeks    Status New    Target Date 04/21/21      PT LONG TERM GOAL #2   Title Patient will decrease Quick DASH scores by > 8 points demonstrating reduced self-reported upper extremity  disability.    Baseline 02/24/2021: QuickDash Disability/symptom score 40, Work score 37.5    Time 8    Period Weeks    Status New    Target Date 04/21/21      PT LONG TERM GOAL #3   Title Patient will increase FOTO score to equal to or greater than 60 to demonstrate statistically significant improvement in mobility and quality of life.    Baseline 02/24/2021: 55  Time 8    Period Weeks    Status New    Target Date 04/21/21      PT LONG TERM GOAL #4   Title Patient will increase BUE MMT scores by at least 1/2 point as to improve functional strength and endurance for overhead activities and ADLs such as driving and washing her hair.    Baseline 02/24/2021: L/R shoulder flexion 4/5, 3+/5; shoulder abduction 4/5 B; shoulder extension 4+5 B; shoulder IR/ER both grossly 4/5 B; elbow flexion/extension 5/5 for both B; wrist flexion/extension 5/5 for both B    Time 8    Period Weeks    Status New    Target Date 04/21/21             Patient will benefit from skilled therapeutic intervention in order to improve the following deficits and impairments:  Increased fascial restricitons,Improper body mechanics,Pain,Decreased mobility,Increased muscle spasms,Postural dysfunction,Decreased activity tolerance,Decreased endurance,Decreased range of motion,Decreased strength,Hypomobility,Impaired UE functional use,Impaired flexibility,Decreased coordination  Visit Diagnosis: Chronic right shoulder pain  Muscle weakness (generalized)     Problem List Patient Active Problem List   Diagnosis Date Noted  . Abdominal pain, right lateral 10/21/2020  . Acute bronchitis with bronchospasm 05/28/2020  . Diarrhea 04/11/2020  . Epigastric pain 02/19/2020  . Dark stools 02/19/2020  . PAF (paroxysmal atrial fibrillation) ([place]) 02/05/2019  . Shortness of breath 01/27/2019  . Irregular surface of cornea 12/25/2018  . HPV (human papilloma virus) infection 12/01/2016  . Screening mammogram, encounter for  11/29/2016  . Estrogen deficiency 11/29/2016  . Encounter for routine gynecological examination 11/29/2016  . Grade III hemorrhoids 10/06/2016  . Tachycardia 08/14/2016  . Hemorrhoids 08/13/2016  . Atypical migraine 08/03/2016  . CAD (coronary artery disease)   . AAA (abdominal aortic aneurysm) ([place])   . Chronic combined systolic (congestive) and diastolic (congestive) heart failure ([place])   . Prediabetes 02/11/2016  . Generalized anxiety disorder 12/08/2015  . Panic attacks 07/28/2015  . Sensorineural hearing loss 03/27/2013  . Vitamin D deficiency 07/03/2012  . H/O aortic dissection 05/12/2012  . Class 2 obesity due to excess calories with body mass index (BMI) of 35.0 to 35.9 in adult 11/21/2011  . Obesity (BMI 30.0-34.9) 11/21/2011  . Routine general medical examination at a health care facility 09/20/2011  . S/P AVR (aortic valve replacement) 01/07/2011  . CORONARY ARTERY BYPASS GRAFT, HX OF 01/07/2011  . Arthritis 12/20/2010  . Back pain 12/15/2010  . Dissection of thoracic aorta ([place]) 12/11/2010  . H/O dissecting abdominal aortic aneurysm repair 08/24/2010  . IRRITABLE BOWEL SYNDROME 09/09/2009  . Obstructive sleep apnea 08/04/2009  . External hemorrhoid 06/26/2009  . Osteoporosis 01/09/2009  . Hyperlipidemia 07/26/2007  . Depression with anxiety 07/26/2007  . Essential hypertension 07/26/2007  . Allergic rhinitis 07/26/2007  . Asthma 07/26/2007  . GERD 07/26/2007  . Osteoarthritis 07/26/2007  . Ocular migraine 07/26/2007  . Mild intermittent asthma without complication 70/17/7939   Ricard Dillon PT, DPT 03/06/2021, 11:22 AM  [place] MAIN [facility] SERVICES [address] [place], [place], [address] Phone: [phone]   Fax:  [phone]  Name: Rachel Torres MRN: [id number] Date of Birth: 1956/11/25

## 2021-03-09 NOTE — Progress Notes (Signed)
I, Wendy Poet, LAT, ATC, am serving as scribe for Dr. Lynne Leader.  Rachel Torres is a 65 y.o. female who presents to [place] [facility] today for f/u chronic R shoulder pain that has worsened since around Jan due to increased stress to shoulder while physically assisting her husband who has Parkinson's w/ ambulation using a gait belt and lifting his walker in-out of the car. Pt has a PMHx of shoulder surgery in 2009. Pt was last seen by Dr. Georgina Snell on 02/09/21 and was given a subacromial steroid injection and was referred to PT, of which she's completed 2 visits. Today, pt reports that her R shoulder and upper arm pain is better than before but con't to have pain w/ activities such as washing her hair or fixing her hair.  She feels that the injection helped her ant chest/subacromial pain but that the R upper arm pain persists.  Dx imaging: 02/09/21 R shoulder XR  07/03/08 R shoulder XR  Pertinent review of systems: No fevers or chills  Relevant historical information: History of aortic dissection.  Patient is concerned that she may have Ehlers-Danlos and does note a history of hypermobility. She is also the primary caregiver of her husband.. History of right shoulder hemiarthroplasty  Exam:  BP 110/74 (BP Location: Left Arm, Patient Position: Sitting, Cuff Size: Normal)   Pulse 92   Ht 5\' 5"  (1.651 m)   Wt 188 lb 12.8 oz (85.6 kg)   SpO2 96%   BMI 31.42 kg/m  General: Well Developed, well nourished, and in no acute distress.   MSK: Right shoulder Normal-appearing slight decreased shoulder range of motion.    Lab and Radiology Results  EXAM: RIGHT SHOULDER - 2+ VIEW  COMPARISON:  Radiographs 07/04/2008  FINDINGS: Status post right total shoulder arthroplasty. There is increased lucency at the bone-cement interface posteriorly and medially which could indicate hardware loosening. No evidence of acute fracture or dislocation. The subacromial space  appears mildly narrowed. Patient is status post median sternotomy.  IMPRESSION: Possible hardware loosening post total shoulder arthroplasty. No evidence of acute fracture or dislocation.   Electronically Signed   By: Richardean Sale M.D.   On: 02/10/2021 14:12  I, Lynne Leader, personally (independently) visualized and performed the interpretation of the images attached in this note.    Assessment and Plan: 65 y.o. female with right shoulder pain improving with physical therapy and subacromial injection.  However she continues have some pain in her humerus and x-ray does show concern for hardware loosening of the stem of the humeral component of the hemiarthroplasty.  Discussed options with patient.  Plan for three-phase bone scan to evaluate for loosening.  Recheck after bone scan.   PDMP not reviewed this encounter. Orders Placed This Encounter  Procedures  . NM Bone Scan 3 Phase Upper Extremity    Standing Status:   Future    Standing Expiration Date:   03/10/2022    Order Specific Question:   If indicated for the ordered procedure, I authorize the administration of a radiopharmaceutical per Radiology protocol    Answer:   Yes    Order Specific Question:   Preferred imaging location?    Answer:   [place]   No orders of the defined types were placed in this encounter.    Discussed warning signs or symptoms. Please see discharge instructions. Patient expresses understanding.   The above documentation has been reviewed and is accurate and complete Lynne Leader, M.D.

## 2021-03-10 ENCOUNTER — Ambulatory Visit (INDEPENDENT_AMBULATORY_CARE_PROVIDER_SITE_OTHER): Payer: Medicare HMO | Admitting: Family Medicine

## 2021-03-10 ENCOUNTER — Other Ambulatory Visit: Payer: Self-pay

## 2021-03-10 ENCOUNTER — Encounter: Payer: Self-pay | Admitting: Family Medicine

## 2021-03-10 ENCOUNTER — Other Ambulatory Visit (INDEPENDENT_AMBULATORY_CARE_PROVIDER_SITE_OTHER): Payer: Self-pay | Admitting: Family Medicine

## 2021-03-10 VITALS — BP 110/74 | HR 92 | Ht 65.0 in | Wt 188.8 lb

## 2021-03-10 DIAGNOSIS — Z969 Presence of functional implant, unspecified: Secondary | ICD-10-CM

## 2021-03-10 DIAGNOSIS — M25511 Pain in right shoulder: Secondary | ICD-10-CM

## 2021-03-10 DIAGNOSIS — R7303 Prediabetes: Secondary | ICD-10-CM

## 2021-03-10 DIAGNOSIS — G8929 Other chronic pain: Secondary | ICD-10-CM

## 2021-03-10 NOTE — Patient Instructions (Signed)
Thank you for coming in today.  Plan for bone scan.   Continue home exercises and PT   Recheck following the test results.

## 2021-03-10 NOTE — Telephone Encounter (Signed)
Last seen by Tracey Aguilar, PA-C. 

## 2021-03-11 ENCOUNTER — Ambulatory Visit: Payer: Medicare HMO | Admitting: Psychology

## 2021-03-13 ENCOUNTER — Ambulatory Visit: Payer: Medicare HMO

## 2021-03-13 ENCOUNTER — Other Ambulatory Visit: Payer: Self-pay

## 2021-03-13 DIAGNOSIS — G8929 Other chronic pain: Secondary | ICD-10-CM

## 2021-03-13 DIAGNOSIS — M6281 Muscle weakness (generalized): Secondary | ICD-10-CM

## 2021-03-13 DIAGNOSIS — M25511 Pain in right shoulder: Secondary | ICD-10-CM | POA: Diagnosis not present

## 2021-03-13 DIAGNOSIS — R293 Abnormal posture: Secondary | ICD-10-CM | POA: Diagnosis not present

## 2021-03-13 NOTE — Therapy (Signed)
[place] MAIN [facility] SERVICES [address] [place], [place], [address] Phone: [phone]   Fax:  [phone]  Physical Therapy Treatment  Patient Details  Name: Rachel Torres MRN: [id number] Date of Birth: 04-Mar-1956 Referring Provider (PT): Gregor Hams, MD   Encounter Date: 03/13/2021   PT End of Session - 03/13/21 0926    Visit Number 3    Number of Visits 17    Date for PT Re-Evaluation 04/21/21    Authorization Type Eval performed 12/29/5091 (recert for 01/25/7123)    PT Start Time 0828    PT Stop Time 0914    PT Time Calculation (min) 46 min    Activity Tolerance Patient tolerated treatment well    Behavior During Therapy [facility] for tasks assessed/performed           Past Medical History:  Diagnosis Date  . AAA (abdominal aortic aneurysm) ([place])    a. Chronic w/o evidence of aneurysmal dil on CTA 01/2016.  Marland Kitchen Alcohol abuse   . Allergy   . Anemia   . Anxiety   . CAD (coronary artery disease)    a. 08/2010 s/p CABG x 1 (VG->RCA) @ time of Ao dissection repair; b. 01/2016 Lexiscan MV: mid antsept/apical defect w/ ? peri-infarct ischemia-->likely attenuation-->Med Rx.  . Chewing difficulty   . Chronic combined systolic (congestive) and diastolic (congestive) heart failure ([place])    a. 2012 EF 30-35%; b. 04/2015 EF 40-45%; c. 01/2016 Echo: Ef 55-65%, nl AoV bioprosthesis, nl RV, nl PASP.  Marland Kitchen Constipation   . Depression   . Edema of both lower extremities   . Gallbladder sludge   . GERD (gastroesophageal reflux disease)   . Heart failure ([place])   . Heart valve problem   . History of Bicuspid Aortic Valve    a. 08/2010 s/p AVR @ time of Ao dissection repair; b. 01/2016 Echo: Ef 55-65%, nl AoV bioprosthesis, nl RV, nl PASP.  Marland Kitchen Hx of repair of dissecting thoracic aortic aneurysm, [place] type A    a. 08/2010 s/p repair with AVR and VG->RCA; b. 01/2016 CTA: stable appearance of Asc Thoracic Aortic graft. Opacification of flase lumen of chronic  abd Ao dissection w/ retrograde flow through lumbar arteries. No aneurysmal dil of flase lumen.  . Hyperlipidemia   . Hypertensive heart disease   . IBS (irritable bowel syndrome)   . Internal hemorrhoids   . Kidney problem   . Marfan syndrome    pt denies  . Migraine   . Obstructive sleep apnea   . Osteoarthritis   . Osteoporosis   . Palpitations   . Pneumonia   . RA (rheumatoid arthritis) ([place])    a. Followed by Dr. [name]    Past Surgical History:  Procedure Laterality Date  . ABDOMINAL AORTIC ANEURYSM REPAIR    . CARDIAC CATHETERIZATION  2001  . [place]  . CHOLECYSTECTOMY    . COLONOSCOPY    . ESOPHAGOGASTRODUODENOSCOPY (EGD) WITH PROPOFOL N/A 05/08/2020   Procedure: ESOPHAGOGASTRODUODENOSCOPY (EGD) WITH PROPOFOL;  Surgeon: Virgel Manifold, MD;  Location: [facility] ENDOSCOPY;  Service: Endoscopy;  Laterality: N/A;  . FRACTURE SURGERY Right    shoulder replacement  . HEMORRHOID BANDING    . ORIF DISTAL RADIUS FRACTURE Left   . UPPER GASTROINTESTINAL ENDOSCOPY      There were no vitals filed for this visit.   Subjective Assessment - 03/13/21 0825    Subjective Pt has pain with RUE supination  with flexion and with abduction. Pt feels pain in anterior and posterior R shoulder.    Pertinent History Pt is a 65 y/o female presenting to therapy due to R shoulder pain. Pt had R shoulder replacement 12-13 years ago.  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 She says shoulder pain started this past January as she was helping lift her husband and his assistive devices. She reports she has since stopped lifting him and his walker as they have caregivers who come to the home for 16 hours/week and can help. Pt is most concerned that her shoulder hurts when she drives and washes her hair. Her shoulder does not hurt constantly, but she states she has pain "the more I use it." She feels a catch and stabbing sensation in R  anterior shoulder that can travel into bicep and that sometimes the pain is a burning sensation. She sometimes has pain in L shoulder.  She rates her worst R shoulder pain as 10/10, best pain is 0/10. Her R shoulder pain is currently 1.5/10. She reports that rest, ice and naproxen help with pain relief. She denies redness/swelling/temp difference and N/T in UEs.  Patient has been seen by other therapists in this clinic previously for cervicalgia and back related injuries/pain. Pt PMH includes COVID19, upper GI stomach issues, aorta type A dissection (2011) with aortic valve replacement, bypass of R coronary artery, HTN, hyperlipidemia, pre-diabetes, AFIB, CHF, depression, anxiety, OA, GERD, atypical migraine, asthma, and allergic rhinitis.    Limitations Lifting;House hold activities;Other (comment)   driving   How long can you sit comfortably? Not affected    How long can you stand comfortably? Not affected    How long can you walk comfortably? Pt reports she has pain only if she is carrying something    Diagnostic tests DG R shoulder 02/09/2021 per chart "Impression: Possible hardware loosening post total shoulder arthroplasty. No  evidence of acute fracture or dislocation"    Patient Stated Goals Be able to drive without pain    Currently in Pain? Yes    Pain Location Shoulder    Pain Orientation Right    Pain Onset More than a month ago          TREATMENT:    Therex Ice to R shoulder x10 minutes while pt performed exercise, seated. Pt with no adverse reaction to treatment.   Matrix 7.5# rows 2x12, 1x5 at 12.5# standing; VC/TC for technique  Isometric against wall abduction/ER of R shoulder 5 second isometric 10x; pt reports soreness with exercise; VC/TC/demonstration for technique  YTB shoulder abduction 2x10, 1x LUEs  YTB shoulder abduction 1x10, 1x8 RUE   YTB ER  Pull-aparts 3x10; pt rates exercise as "medium"  Shoulder stability ball bounce on mat table - multiple attempts, pt  with difficulty so discontinued  Supine shoulder perturbations 2x60 sec BUEs; VC/TC for technique  Seated thoracic extension with towel roll placed at back - x10  Shoulder IR with RTB - 3x8-10 BUEs    Access Code: [email or web address] URL: [email or web address] Date: 03/13/2021 Prepared by: Ricard Dillon  Exercises Shoulder External Rotation and Scapular Retraction with Resistance - 1 x daily - 4 x weekly - 3 sets - 10 reps Isometric Shoulder Internal Rotation in Abduction at Wall - 1 x daily - 4 x weekly - 3 sets - 10 reps  Education provided throughout session via VC/TC and demonstration to facilitate movement at target joints and correct muscle activation  for all performed. Patient able to follow cues and return demonstration with good carryover.      PT Short Term Goals - 02/24/21 1900      PT SHORT TERM GOAL #1   Title Patient will be independent in home exercise program to improve strength/mobility for better functional independence with ADLs.    Baseline 02/24/2021 Pt issued HEP    Time 4    Period Weeks    Status New    Target Date 03/24/21             PT Long Term Goals - 02/24/21 1842      PT LONG TERM GOAL #1   Title Patient will report a worst pain of 3/10  in last 7 days in her R shoulder to improve tolerance with ADLs and reduced symptoms with activities.    Baseline 02/24/2021: Pt reports worst pain as 10/10.    Time 8    Period Weeks    Status New    Target Date 04/21/21      PT LONG TERM GOAL #2   Title Patient will decrease Quick DASH scores by > 8 points demonstrating reduced self-reported upper extremity disability.    Baseline 02/24/2021: QuickDash Disability/symptom score 40, Work score 37.5    Time 8    Period Weeks    Status New    Target Date 04/21/21      PT LONG TERM GOAL #3   Title Patient will increase FOTO score to equal to or greater than 60 to demonstrate statistically significant improvement in mobility and quality of life.     Baseline 02/24/2021: 55    Time 8    Period Weeks    Status New    Target Date 04/21/21      PT LONG TERM GOAL #4   Title Patient will increase BUE MMT scores by at least 1/2 point as to improve functional strength and endurance for overhead activities and ADLs such as driving and washing her hair.    Baseline 02/24/2021: L/R shoulder flexion 4/5, 3+/5; shoulder abduction 4/5 B; shoulder extension 4+5 B; shoulder IR/ER both grossly 4/5 B; elbow flexion/extension 5/5 for both B; wrist flexion/extension 5/5 for both B    Time 8    Period Weeks    Status New    Target Date 04/21/21                 Plan - 03/13/21 9833    Clinical Impression Statement Continued therex from prior session with addition of shoulder perturbation exercises to improve R shoulder stability. Pt with decreased control with exercise of RUE compared to LUE. Pt will continue to benefit from further skilled therapy to improve RUE shoulder pain, stability and strength to increase QOL.    Personal Factors and Comorbidities Comorbidity 1;Comorbidity 2;Comorbidity 3+;Past/Current Experience;Social Background;Time since onset of injury/illness/exacerbation;Transportation;Fitness;Other    Comorbidities Pertinent: recent hx of COVID19, HTN, hyperlipidemia, pre-diabetes, AFIB, CHF, depression, anxiety, OA, atypical migraine, asthma.    Examination-Activity Limitations Bathing;Bed Mobility;Hygiene/Grooming;Lift;Caring for Others;Reach Overhead;Carry;Dressing;Sleep;Other    Examination-Participation Restrictions Laundry;Cleaning;Driving;Community Activity;Other;Yard Work;Shop;Meal Prep;Volunteer    Stability/Clinical Decision Making Stable/Uncomplicated    Rehab Potential Good    PT Frequency 2x / week    PT Duration 8 weeks    PT Treatment/Interventions ADLs/Self Care Home Management;Biofeedback;Cryotherapy;Electrical Stimulation;Moist Heat;Traction;Ultrasound;DME Instruction;Functional mobility training;Therapeutic  activities;Therapeutic exercise;Neuromuscular re-education;Patient/family education;Orthotic Fit/Training;Manual techniques;Scar mobilization;Passive range of motion;Dry needling;Taping;Energy conservation;Joint Manipulations;Spinal Manipulations;Gait training    PT Next Visit Plan Progress shoulder strengthening exercises;  cervical stretches within pain tolerance    PT Home Exercise Plan Issued HEP with handout: RTB rows, RTB shoulder ER pull-aparts, and upper trap stretch; shoulder perturbation exercises    Consulted and Agree with Plan of Care Patient           Patient will benefit from skilled therapeutic intervention in order to improve the following deficits and impairments:  Increased fascial restricitons,Improper body mechanics,Pain,Decreased mobility,Increased muscle spasms,Postural dysfunction,Decreased activity tolerance,Decreased endurance,Decreased range of motion,Decreased strength,Hypomobility,Impaired UE functional use,Impaired flexibility,Decreased coordination  Visit Diagnosis: Muscle weakness (generalized)  Chronic right shoulder pain     Problem List Patient Active Problem List   Diagnosis Date Noted  . Abdominal pain, right lateral 10/21/2020  . Acute bronchitis with bronchospasm 05/28/2020  . Diarrhea 04/11/2020  . Epigastric pain 02/19/2020  . Dark stools 02/19/2020  . PAF (paroxysmal atrial fibrillation) ([place]) 02/05/2019  . Shortness of breath 01/27/2019  . Irregular surface of cornea 12/25/2018  . HPV (human papilloma virus) infection 12/01/2016  . Screening mammogram, encounter for 11/29/2016  . Estrogen deficiency 11/29/2016  . Encounter for routine gynecological examination 11/29/2016  . Grade III hemorrhoids 10/06/2016  . Tachycardia 08/14/2016  . Hemorrhoids 08/13/2016  . Atypical migraine 08/03/2016  . CAD (coronary artery disease)   . AAA (abdominal aortic aneurysm) ([place])   . Chronic combined systolic (congestive) and diastolic (congestive)  heart failure ([place])   . Prediabetes 02/11/2016  . Generalized anxiety disorder 12/08/2015  . Panic attacks 07/28/2015  . Sensorineural hearing loss 03/27/2013  . Vitamin D deficiency 07/03/2012  . H/O aortic dissection 05/12/2012  . Class 2 obesity due to excess calories with body mass index (BMI) of 35.0 to 35.9 in adult 11/21/2011  . Obesity (BMI 30.0-34.9) 11/21/2011  . Routine general medical examination at a health care facility 09/20/2011  . S/P AVR (aortic valve replacement) 01/07/2011  . CORONARY ARTERY BYPASS GRAFT, HX OF 01/07/2011  . Arthritis 12/20/2010  . Back pain 12/15/2010  . Dissection of thoracic aorta ([place]) 12/11/2010  . H/O dissecting abdominal aortic aneurysm repair 08/24/2010  . IRRITABLE BOWEL SYNDROME 09/09/2009  . Obstructive sleep apnea 08/04/2009  . External hemorrhoid 06/26/2009  . Osteoporosis 01/09/2009  . Hyperlipidemia 07/26/2007  . Depression with anxiety 07/26/2007  . Essential hypertension 07/26/2007  . Allergic rhinitis 07/26/2007  . Asthma 07/26/2007  . GERD 07/26/2007  . Osteoarthritis 07/26/2007  . Ocular migraine 07/26/2007  . Mild intermittent asthma without complication 09/11/3006   Ricard Dillon PT, DPT 03/13/2021, 9:28 AM  [place] MAIN [facility] SERVICES [address] [place], [place], [address] Phone: [phone]   Fax:  [phone]  Name: NYRIAH COOTE MRN: [id number] Date of Birth: 1956-04-20

## 2021-03-17 ENCOUNTER — Ambulatory Visit (INDEPENDENT_AMBULATORY_CARE_PROVIDER_SITE_OTHER): Payer: Medicare HMO | Admitting: Psychology

## 2021-03-17 ENCOUNTER — Other Ambulatory Visit: Payer: Self-pay

## 2021-03-17 ENCOUNTER — Ambulatory Visit: Payer: Medicare HMO

## 2021-03-17 ENCOUNTER — Other Ambulatory Visit (INDEPENDENT_AMBULATORY_CARE_PROVIDER_SITE_OTHER): Payer: Self-pay

## 2021-03-17 ENCOUNTER — Telehealth (INDEPENDENT_AMBULATORY_CARE_PROVIDER_SITE_OTHER): Payer: Self-pay | Admitting: Physician Assistant

## 2021-03-17 ENCOUNTER — Ambulatory Visit: Payer: TRICARE For Life (TFL) | Admitting: Psychology

## 2021-03-17 DIAGNOSIS — R7303 Prediabetes: Secondary | ICD-10-CM

## 2021-03-17 DIAGNOSIS — F411 Generalized anxiety disorder: Secondary | ICD-10-CM

## 2021-03-17 DIAGNOSIS — F331 Major depressive disorder, recurrent, moderate: Secondary | ICD-10-CM

## 2021-03-17 DIAGNOSIS — G8929 Other chronic pain: Secondary | ICD-10-CM | POA: Diagnosis not present

## 2021-03-17 DIAGNOSIS — M25511 Pain in right shoulder: Secondary | ICD-10-CM | POA: Diagnosis not present

## 2021-03-17 DIAGNOSIS — M6281 Muscle weakness (generalized): Secondary | ICD-10-CM | POA: Diagnosis not present

## 2021-03-17 DIAGNOSIS — R293 Abnormal posture: Secondary | ICD-10-CM | POA: Diagnosis not present

## 2021-03-17 MED ORDER — OZEMPIC (0.25 OR 0.5 MG/DOSE) 2 MG/1.5ML ~~LOC~~ SOPN: 0.2500 mg | PEN_INJECTOR | SUBCUTANEOUS | 0 refills | Status: DC

## 2021-03-17 NOTE — Telephone Encounter (Signed)
Prescription sent in to [facility]. Rachel Torres, [place]

## 2021-03-17 NOTE — Therapy (Signed)
[place] MAIN [facility] SERVICES [address] [place], [place], [address] Phone: [phone]   Fax:  [phone]  Physical Therapy Treatment  Patient Details  Name: Rachel Torres MRN: [id number] Date of Birth: [birth year] Referring Provider (PT): Gregor Hams, MD   Encounter Date: 03/17/2021   PT End of Session - 03/17/21 1724    Visit Number 4    Number of Visits 17    Date for PT Re-Evaluation 04/21/21    Authorization Type Eval performed 01/28/9370 (recert for 05/28/6788)    PT Start Time 1517    PT Stop Time 1559    PT Time Calculation (min) 42 min    Activity Tolerance Patient tolerated treatment well    Behavior During Therapy [facility] for tasks assessed/performed           Past Medical History:  Diagnosis Date  . AAA (abdominal aortic aneurysm) ([place])    a. Chronic w/o evidence of aneurysmal dil on CTA 01/2016.  Marland Kitchen Alcohol abuse   . Allergy   . Anemia   . Anxiety   . CAD (coronary artery disease)    a. 08/2010 s/p CABG x 1 (VG->RCA) @ time of Ao dissection repair; b. 01/2016 Lexiscan MV: mid antsept/apical defect w/ ? peri-infarct ischemia-->likely attenuation-->Med Rx.  . Chewing difficulty   . Chronic combined systolic (congestive) and diastolic (congestive) heart failure ([place])    a. 2012 EF 30-35%; b. 04/2015 EF 40-45%; c. 01/2016 Echo: Ef 55-65%, nl AoV bioprosthesis, nl RV, nl PASP.  Marland Kitchen Constipation   . Depression   . Edema of both lower extremities   . Gallbladder sludge   . GERD (gastroesophageal reflux disease)   . Heart failure ([place])   . Heart valve problem   . History of Bicuspid Aortic Valve    a. 08/2010 s/p AVR @ time of Ao dissection repair; b. 01/2016 Echo: Ef 55-65%, nl AoV bioprosthesis, nl RV, nl PASP.  Marland Kitchen Hx of repair of dissecting thoracic aortic aneurysm, [place] type A    a. 08/2010 s/p repair with AVR and VG->RCA; b. 01/2016 CTA: stable appearance of Asc Thoracic Aortic graft. Opacification of flase lumen of chronic  abd Ao dissection w/ retrograde flow through lumbar arteries. No aneurysmal dil of flase lumen.  . Hyperlipidemia   . Hypertensive heart disease   . IBS (irritable bowel syndrome)   . Internal hemorrhoids   . Kidney problem   . Marfan syndrome    pt denies  . Migraine   . Obstructive sleep apnea   . Osteoarthritis   . Osteoporosis   . Palpitations   . Pneumonia   . RA (rheumatoid arthritis) ([place])    a. Followed by Dr. [name]    Past Surgical History:  Procedure Laterality Date  . ABDOMINAL AORTIC ANEURYSM REPAIR    . CARDIAC CATHETERIZATION  2001  . [place]  . CHOLECYSTECTOMY    . COLONOSCOPY    . ESOPHAGOGASTRODUODENOSCOPY (EGD) WITH PROPOFOL N/A 05/08/2020   Procedure: ESOPHAGOGASTRODUODENOSCOPY (EGD) WITH PROPOFOL;  Surgeon: Virgel Manifold, MD;  Location: [facility] ENDOSCOPY;  Service: Endoscopy;  Laterality: N/A;  . FRACTURE SURGERY Right    shoulder replacement  . HEMORRHOID BANDING    . ORIF DISTAL RADIUS FRACTURE Left   . UPPER GASTROINTESTINAL ENDOSCOPY      There were no vitals filed for this visit.   Subjective Assessment - 03/17/21 1722    Subjective Pt reports using ice at home  to reduce soreness. Pt with continued R shoulder pain.    Pertinent History Pt is a 65 y/o female presenting to therapy due to R shoulder pain. Pt had R shoulder replacement 12-13 years ago.  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 She says shoulder pain started this past January as she was helping lift her husband and his assistive devices. She reports she has since stopped lifting him and his walker as they have caregivers who come to the home for 16 hours/week and can help. Pt is most concerned that her shoulder hurts when she drives and washes her hair. Her shoulder does not hurt constantly, but she states she has pain "the more I use it." She feels a catch and stabbing sensation in R anterior shoulder that can travel  into bicep and that sometimes the pain is a burning sensation. She sometimes has pain in L shoulder.  She rates her worst R shoulder pain as 10/10, best pain is 0/10. Her R shoulder pain is currently 1.5/10. She reports that rest, ice and naproxen help with pain relief. She denies redness/swelling/temp difference and N/T in UEs.  Patient has been seen by other therapists in this clinic previously for cervicalgia and back related injuries/pain. Pt PMH includes COVID19, upper GI stomach issues, aorta type A dissection (2011) with aortic valve replacement, bypass of R coronary artery, HTN, hyperlipidemia, pre-diabetes, AFIB, CHF, depression, anxiety, OA, GERD, atypical migraine, asthma, and allergic rhinitis.    Limitations Lifting;House hold activities;Other (comment)   driving   How long can you sit comfortably? Not affected    How long can you stand comfortably? Not affected    How long can you walk comfortably? Pt reports she has pain only if she is carrying something    Diagnostic tests DG R shoulder 02/09/2021 per chart "Impression: Possible hardware loosening post total shoulder arthroplasty. No  evidence of acute fracture or dislocation"    Patient Stated Goals Be able to drive without pain    Currently in Pain? Yes    Pain Location Shoulder    Pain Orientation Right    Pain Onset More than a month ago           TREATMENT:    Therex  Ice to R shoulder x20 minutes while pt performed exercise, seated. Pt with no adverse reaction to treatment.     Matrix standing rows 12.5# 3x12 standing; VC/TC for technique;  Matrix standing, L shoulder IR/ER with 2.5# 2x8 for each; TC for technique  Seated YTB IR/ER for RUE 3x10 for each; pt rates medium-hard; VC for technique  YTB flexion 3x10 BUEs; VC/TC for technique  RTB pull aparts 1x12, 2x10, demo/VC/TC for technique  Bird dogs with alternating arms (flexion) - 3x10 with 2 sec hold; VC/TC and demo for technique  Isometric against wall  abduction/ER of R shoulder 5 second isometric 6x; pt reports slight increase in pain with over 90 deg. Abduction so pt cued to reduce to <90 deg abduction.; VC for technique   Manual: Pt seated, with STM to B upper traps and cervical paraspinals x approx 11 min. Pt with multiple trigger points primarily throughout R upper trap. Pt reports improvement in sx with STM. No adverse reaction to tx.   Education provided throughout session via VC/TC and demonstration to facilitate movement at target joints and correct muscle activation for all performed. Patient able to follow cues and return demonstration with good carryover.  PT Education - 03/17/21 1723    Education Details Education with table top/bird dog exercise for core and shoulder stability    Person(s) Educated Patient    Methods Explanation;Demonstration;Verbal cues    Comprehension Verbalized understanding;Returned demonstration            PT Short Term Goals - 02/24/21 1900      PT SHORT TERM GOAL #1   Title Patient will be independent in home exercise program to improve strength/mobility for better functional independence with ADLs.    Baseline 02/24/2021 Pt issued HEP    Time 4    Period Weeks    Status New    Target Date 03/24/21             PT Long Term Goals - 02/24/21 1842      PT LONG TERM GOAL #1   Title Patient will report a worst pain of 3/10  in last 7 days in her R shoulder to improve tolerance with ADLs and reduced symptoms with activities.    Baseline 02/24/2021: Pt reports worst pain as 10/10.    Time 8    Period Weeks    Status New    Target Date 04/21/21      PT LONG TERM GOAL #2   Title Patient will decrease Quick DASH scores by > 8 points demonstrating reduced self-reported upper extremity disability.    Baseline 02/24/2021: QuickDash Disability/symptom score 40, Work score 37.5    Time 8    Period Weeks    Status New    Target Date 04/21/21      PT LONG TERM GOAL #3   Title Patient will  increase FOTO score to equal to or greater than 60 to demonstrate statistically significant improvement in mobility and quality of life.    Baseline 02/24/2021: 55    Time 8    Period Weeks    Status New    Target Date 04/21/21      PT LONG TERM GOAL #4   Title Patient will increase BUE MMT scores by at least 1/2 point as to improve functional strength and endurance for overhead activities and ADLs such as driving and washing her hair.    Baseline 02/24/2021: L/R shoulder flexion 4/5, 3+/5; shoulder abduction 4/5 B; shoulder extension 4+5 B; shoulder IR/ER both grossly 4/5 B; elbow flexion/extension 5/5 for both B; wrist flexion/extension 5/5 for both B    Time 8    Period Weeks    Status New    Target Date 04/21/21                 Plan - 03/17/21 1725    Clinical Impression Statement Performed stm to B upper trap and cervical paraspinals this session, with majority of time spent focused on R shoulder. Pt with multiple trigger points throughout R upper trap where pt reported improvement in sx with stm. Pt also able to tolerate increased volume of R shoulder IR/ER with YTB. The pt does has some difficulty with muscular endurance to sustain bird-dog exercise with emphasis on shoulder stability training. The pt will benefit from further skilled therapy to improve R shoulder pain and BUE strength to improve QOL.    Personal Factors and Comorbidities Comorbidity 1;Comorbidity 2;Comorbidity 3+;Past/Current Experience;Social Background;Time since onset of injury/illness/exacerbation;Transportation;Fitness;Other    Comorbidities Pertinent: recent hx of COVID19, HTN, hyperlipidemia, pre-diabetes, AFIB, CHF, depression, anxiety, OA, atypical migraine, asthma.    Examination-Activity Limitations Bathing;Bed Mobility;Hygiene/Grooming;Lift;Caring for Others;Reach Overhead;Carry;Dressing;Sleep;Other    Examination-Participation Restrictions  Laundry;Cleaning;Driving;Community Activity;Other;Yard  Work;Shop;Meal Prep;Volunteer    Stability/Clinical Decision Making Stable/Uncomplicated    Rehab Potential Good    PT Frequency 2x / week    PT Duration 8 weeks    PT Treatment/Interventions ADLs/Self Care Home Management;Biofeedback;Cryotherapy;Electrical Stimulation;Moist Heat;Traction;Ultrasound;DME Instruction;Functional mobility training;Therapeutic activities;Therapeutic exercise;Neuromuscular re-education;Patient/family education;Orthotic Fit/Training;Manual techniques;Scar mobilization;Passive range of motion;Dry needling;Taping;Energy conservation;Joint Manipulations;Spinal Manipulations;Gait training    PT Next Visit Plan Progress shoulder strengthening exercises; cervical stretches within pain tolerance, shoulder stability exercises/closed kinetic chain    PT Home Exercise Plan Issued HEP with handout: RTB rows, RTB shoulder ER pull-aparts, and upper trap stretch; shoulder perturbation exercises    Consulted and Agree with Plan of Care Patient           Patient will benefit from skilled therapeutic intervention in order to improve the following deficits and impairments:  Increased fascial restricitons,Improper body mechanics,Pain,Decreased mobility,Increased muscle spasms,Postural dysfunction,Decreased activity tolerance,Decreased endurance,Decreased range of motion,Decreased strength,Hypomobility,Impaired UE functional use,Impaired flexibility,Decreased coordination  Visit Diagnosis: Muscle weakness (generalized)  Chronic right shoulder pain     Problem List Patient Active Problem List   Diagnosis Date Noted  . Abdominal pain, right lateral 10/21/2020  . Acute bronchitis with bronchospasm 05/28/2020  . Diarrhea 04/11/2020  . Epigastric pain 02/19/2020  . Dark stools 02/19/2020  . PAF (paroxysmal atrial fibrillation) ([place]) 02/05/2019  . Shortness of breath 01/27/2019  . Irregular surface of cornea 12/25/2018  . HPV (human papilloma virus) infection 12/01/2016  .  Screening mammogram, encounter for 11/29/2016  . Estrogen deficiency 11/29/2016  . Encounter for routine gynecological examination 11/29/2016  . Grade III hemorrhoids 10/06/2016  . Tachycardia 08/14/2016  . Hemorrhoids 08/13/2016  . Atypical migraine 08/03/2016  . CAD (coronary artery disease)   . AAA (abdominal aortic aneurysm) ([place])   . Chronic combined systolic (congestive) and diastolic (congestive) heart failure ([place])   . Prediabetes 02/11/2016  . Generalized anxiety disorder 12/08/2015  . Panic attacks 07/28/2015  . Sensorineural hearing loss 03/27/2013  . Vitamin D deficiency 07/03/2012  . H/O aortic dissection 05/12/2012  . Class 2 obesity due to excess calories with body mass index (BMI) of 35.0 to 35.9 in adult 11/21/2011  . Obesity (BMI 30.0-34.9) 11/21/2011  . Routine general medical examination at a health care facility 09/20/2011  . S/P AVR (aortic valve replacement) 01/07/2011  . CORONARY ARTERY BYPASS GRAFT, HX OF 01/07/2011  . Arthritis 12/20/2010  . Back pain 12/15/2010  . Dissection of thoracic aorta ([place]) 12/11/2010  . H/O dissecting abdominal aortic aneurysm repair 08/24/2010  . IRRITABLE BOWEL SYNDROME 09/09/2009  . Obstructive sleep apnea 08/04/2009  . External hemorrhoid 06/26/2009  . Osteoporosis 01/09/2009  . Hyperlipidemia 07/26/2007  . Depression with anxiety 07/26/2007  . Essential hypertension 07/26/2007  . Allergic rhinitis 07/26/2007  . Asthma 07/26/2007  . GERD 07/26/2007  . Osteoarthritis 07/26/2007  . Ocular migraine 07/26/2007  . Mild intermittent asthma without complication 76/54/6503   Ricard Dillon PT, DPT 03/17/2021, 5:34 PM  [place] MAIN [facility] SERVICES [address] [place], [place], [address] Phone: [phone]   Fax:  [phone]  Name: Rachel Torres MRN: [id number] Date of Birth: [birth year]

## 2021-03-17 NOTE — Telephone Encounter (Signed)
Last seen Rachel Torres 

## 2021-03-17 NOTE — Telephone Encounter (Signed)
Needs Ozempic called into [facility] in [place].  She should have taken it today and it was sent to [facility] and she doesn't have it.

## 2021-03-20 ENCOUNTER — Other Ambulatory Visit: Payer: Self-pay

## 2021-03-20 ENCOUNTER — Ambulatory Visit: Payer: Medicare HMO | Attending: Family Medicine

## 2021-03-20 DIAGNOSIS — G8929 Other chronic pain: Secondary | ICD-10-CM | POA: Insufficient documentation

## 2021-03-20 DIAGNOSIS — R2689 Other abnormalities of gait and mobility: Secondary | ICD-10-CM | POA: Insufficient documentation

## 2021-03-20 DIAGNOSIS — R293 Abnormal posture: Secondary | ICD-10-CM

## 2021-03-20 DIAGNOSIS — M6281 Muscle weakness (generalized): Secondary | ICD-10-CM | POA: Diagnosis not present

## 2021-03-20 DIAGNOSIS — M25511 Pain in right shoulder: Secondary | ICD-10-CM | POA: Insufficient documentation

## 2021-03-20 DIAGNOSIS — R278 Other lack of coordination: Secondary | ICD-10-CM | POA: Insufficient documentation

## 2021-03-20 NOTE — Therapy (Signed)
[place] MAIN [facility] SERVICES [address] [place], [place], [address] Phone: [phone]   Fax:  [phone]  Physical Therapy Treatment  Patient Details  Name: Rachel Torres MRN: [id number] Date of Birth: [birth year] Referring Provider (PT): Gregor Hams, MD   Encounter Date: 03/20/2021   PT End of Session - 03/20/21 0926    Visit Number 5    Number of Visits 17    Date for PT Re-Evaluation 04/21/21    Authorization Type Eval performed 08/23/8545 (recert for 01/26/349)    PT Start Time 0832    PT Stop Time 0915    PT Time Calculation (min) 43 min    Activity Tolerance Patient tolerated treatment well    Behavior During Therapy [facility] for tasks assessed/performed           Past Medical History:  Diagnosis Date  . AAA (abdominal aortic aneurysm) ([place])    a. Chronic w/o evidence of aneurysmal dil on CTA 01/2016.  Marland Kitchen Alcohol abuse   . Allergy   . Anemia   . Anxiety   . CAD (coronary artery disease)    a. 08/2010 s/p CABG x 1 (VG->RCA) @ time of Ao dissection repair; b. 01/2016 Lexiscan MV: mid antsept/apical defect w/ ? peri-infarct ischemia-->likely attenuation-->Med Rx.  . Chewing difficulty   . Chronic combined systolic (congestive) and diastolic (congestive) heart failure ([place])    a. 2012 EF 30-35%; b. 04/2015 EF 40-45%; c. 01/2016 Echo: Ef 55-65%, nl AoV bioprosthesis, nl RV, nl PASP.  Marland Kitchen Constipation   . Depression   . Edema of both lower extremities   . Gallbladder sludge   . GERD (gastroesophageal reflux disease)   . Heart failure ([place])   . Heart valve problem   . History of Bicuspid Aortic Valve    a. 08/2010 s/p AVR @ time of Ao dissection repair; b. 01/2016 Echo: Ef 55-65%, nl AoV bioprosthesis, nl RV, nl PASP.  Marland Kitchen Hx of repair of dissecting thoracic aortic aneurysm, [place] type A    a. 08/2010 s/p repair with AVR and VG->RCA; b. 01/2016 CTA: stable appearance of Asc Thoracic Aortic graft. Opacification of flase lumen of chronic  abd Ao dissection w/ retrograde flow through lumbar arteries. No aneurysmal dil of flase lumen.  . Hyperlipidemia   . Hypertensive heart disease   . IBS (irritable bowel syndrome)   . Internal hemorrhoids   . Kidney problem   . Marfan syndrome    pt denies  . Migraine   . Obstructive sleep apnea   . Osteoarthritis   . Osteoporosis   . Palpitations   . Pneumonia   . RA (rheumatoid arthritis) ([place])    a. Followed by Dr. [name]    Past Surgical History:  Procedure Laterality Date  . ABDOMINAL AORTIC ANEURYSM REPAIR    . CARDIAC CATHETERIZATION  2001  . [place]  . CHOLECYSTECTOMY    . COLONOSCOPY    . ESOPHAGOGASTRODUODENOSCOPY (EGD) WITH PROPOFOL N/A 05/08/2020   Procedure: ESOPHAGOGASTRODUODENOSCOPY (EGD) WITH PROPOFOL;  Surgeon: Virgel Manifold, MD;  Location: [facility] ENDOSCOPY;  Service: Endoscopy;  Laterality: N/A;  . FRACTURE SURGERY Right    shoulder replacement  . HEMORRHOID BANDING    . ORIF DISTAL RADIUS FRACTURE Left   . UPPER GASTROINTESTINAL ENDOSCOPY      There were no vitals filed for this visit.  Manual: Pt seated, with STM to B upper traps and cervical paraspinals x approx 16 min.  Pt with multiple trigger points throughout R upper trap, R cervical paraspinals. Pt reports improvement in sx with STM. No adverse reaction to tx.   Therex   Ice to R shoulder x10 minutes while pt performed exercise, seated. Pt with no adverse reaction to treatment.   Upper trap stretch 2x30 sec B  Levator scap stretch 2x30 sec B; pt reports greater difficulty going to R  Scapular squeezes 20x   Physioball rollouts for global stretch of spine mm/lats and into shoulder flexion B below pain threshold; 10x cuing to decrease ROM to maintain pain-free range  Physioball rollouts side-to-side  RTB pull aparts 2x12, demo/VC/TC for technique; pt rates exercise medium-hard  YTB serratus punches 1x10, Demo, VC/TC for technique   Bird dogs with alternating  arms (flexion) - 2x10 with 2 sec hold; VC/TC for technique  Followed by more scapular squeezes 20x for pain modulation: pt reports improvement in sx    Education provided throughout session via VC/TC and demonstration to facilitate movement at target joints and correct muscle activation for all performed. Patient able to follow cues and return demonstration with good carryover   Assessment: Pt responds well to STM to R upper traps and R paraspinal musculature followed by stretches and scapular squeezes reporting improvement pain. Pt cued this session for control of AROM with physioball rollouts to decreased B shoulder flexion as pt demonstrates hypermobile range with pain. When pt decreased ROM pt without pain. The pt will benefit from further skilled therapy to improve R shoulder pain and strength to improve QOL.    PT Short Term Goals - 02/24/21 1900      PT SHORT TERM GOAL #1   Title Patient will be independent in home exercise program to improve strength/mobility for better functional independence with ADLs.    Baseline 02/24/2021 Pt issued HEP    Time 4    Period Weeks    Status New    Target Date 03/24/21             PT Long Term Goals - 02/24/21 1842      PT LONG TERM GOAL #1   Title Patient will report a worst pain of 3/10  in last 7 days in her R shoulder to improve tolerance with ADLs and reduced symptoms with activities.    Baseline 02/24/2021: Pt reports worst pain as 10/10.    Time 8    Period Weeks    Status New    Target Date 04/21/21      PT LONG TERM GOAL #2   Title Patient will decrease Quick DASH scores by > 8 points demonstrating reduced self-reported upper extremity disability.    Baseline 02/24/2021: QuickDash Disability/symptom score 40, Work score 37.5    Time 8    Period Weeks    Status New    Target Date 04/21/21      PT LONG TERM GOAL #3   Title Patient will increase FOTO score to equal to or greater than 60 to demonstrate statistically significant  improvement in mobility and quality of life.    Baseline 02/24/2021: 55    Time 8    Period Weeks    Status New    Target Date 04/21/21      PT LONG TERM GOAL #4   Title Patient will increase BUE MMT scores by at least 1/2 point as to improve functional strength and endurance for overhead activities and ADLs such as driving and washing her hair.    Baseline 02/24/2021:  L/R shoulder flexion 4/5, 3+/5; shoulder abduction 4/5 B; shoulder extension 4+5 B; shoulder IR/ER both grossly 4/5 B; elbow flexion/extension 5/5 for both B; wrist flexion/extension 5/5 for both B    Time 8    Period Weeks    Status New    Target Date 04/21/21                 Plan - 03/20/21 0925    Clinical Impression Statement Pt responds well to STM to R upper traps and R paraspinal musculature followed by stretches and scapular squeezes reporting improvement pain. Pt cued this session for control of AROM with physioball rollouts to decreased B shoulder flexion as pt demonstrates hypermobile range with pain. When pt decreased ROM pt without pain. The pt will benefit from further skilled therapy to improve R shoulder pain and strength to improve QOL.    Personal Factors and Comorbidities Comorbidity 1;Comorbidity 2;Comorbidity 3+;Past/Current Experience;Social Background;Time since onset of injury/illness/exacerbation;Transportation;Fitness;Other    Comorbidities Pertinent: recent hx of COVID19, HTN, hyperlipidemia, pre-diabetes, AFIB, CHF, depression, anxiety, OA, atypical migraine, asthma.    Examination-Activity Limitations Bathing;Bed Mobility;Hygiene/Grooming;Lift;Caring for Others;Reach Overhead;Carry;Dressing;Sleep;Other    Examination-Participation Restrictions Laundry;Cleaning;Driving;Community Activity;Other;Yard Work;Shop;Meal Prep;Volunteer    Stability/Clinical Decision Making Stable/Uncomplicated    Rehab Potential Good    PT Frequency 2x / week    PT Duration 8 weeks    PT Treatment/Interventions  ADLs/Self Care Home Management;Biofeedback;Cryotherapy;Electrical Stimulation;Moist Heat;Traction;Ultrasound;DME Instruction;Functional mobility training;Therapeutic activities;Therapeutic exercise;Neuromuscular re-education;Patient/family education;Orthotic Fit/Training;Manual techniques;Scar mobilization;Passive range of motion;Dry needling;Taping;Energy conservation;Joint Manipulations;Spinal Manipulations;Gait training    PT Next Visit Plan Progress shoulder strengthening exercises; cervical stretches within pain tolerance, shoulder stability exercises/closed kinetic chain, closed kinetic chain push-up plus    PT Home Exercise Plan Issued HEP with handout: RTB rows, RTB shoulder ER pull-aparts, and upper trap stretch; shoulder perturbation exercises    Consulted and Agree with Plan of Care Patient           Patient will benefit from skilled therapeutic intervention in order to improve the following deficits and impairments:  Increased fascial restricitons,Improper body mechanics,Pain,Decreased mobility,Increased muscle spasms,Postural dysfunction,Decreased activity tolerance,Decreased endurance,Decreased range of motion,Decreased strength,Hypomobility,Impaired UE functional use,Impaired flexibility,Decreased coordination  Visit Diagnosis: Muscle weakness (generalized)  Chronic right shoulder pain  Abnormal posture     Problem List Patient Active Problem List   Diagnosis Date Noted  . Abdominal pain, right lateral 10/21/2020  . Acute bronchitis with bronchospasm 05/28/2020  . Diarrhea 04/11/2020  . Epigastric pain 02/19/2020  . Dark stools 02/19/2020  . PAF (paroxysmal atrial fibrillation) ([place]) 02/05/2019  . Shortness of breath 01/27/2019  . Irregular surface of cornea 12/25/2018  . HPV (human papilloma virus) infection 12/01/2016  . Screening mammogram, encounter for 11/29/2016  . Estrogen deficiency 11/29/2016  . Encounter for routine gynecological examination 11/29/2016  .  Grade III hemorrhoids 10/06/2016  . Tachycardia 08/14/2016  . Hemorrhoids 08/13/2016  . Atypical migraine 08/03/2016  . CAD (coronary artery disease)   . AAA (abdominal aortic aneurysm) ([place])   . Chronic combined systolic (congestive) and diastolic (congestive) heart failure ([place])   . Prediabetes 02/11/2016  . Generalized anxiety disorder 12/08/2015  . Panic attacks 07/28/2015  . Sensorineural hearing loss 03/27/2013  . Vitamin D deficiency 07/03/2012  . H/O aortic dissection 05/12/2012  . Class 2 obesity due to excess calories with body mass index (BMI) of 35.0 to 35.9 in adult 11/21/2011  . Obesity (BMI 30.0-34.9) 11/21/2011  . Routine general medical examination at a health care facility 09/20/2011  . S/P  AVR (aortic valve replacement) 01/07/2011  . CORONARY ARTERY BYPASS GRAFT, HX OF 01/07/2011  . Arthritis 12/20/2010  . Back pain 12/15/2010  . Dissection of thoracic aorta ([place]) 12/11/2010  . H/O dissecting abdominal aortic aneurysm repair 08/24/2010  . IRRITABLE BOWEL SYNDROME 09/09/2009  . Obstructive sleep apnea 08/04/2009  . External hemorrhoid 06/26/2009  . Osteoporosis 01/09/2009  . Hyperlipidemia 07/26/2007  . Depression with anxiety 07/26/2007  . Essential hypertension 07/26/2007  . Allergic rhinitis 07/26/2007  . Asthma 07/26/2007  . GERD 07/26/2007  . Osteoarthritis 07/26/2007  . Ocular migraine 07/26/2007  . Mild intermittent asthma without complication 42/35/3614   Ricard Dillon PT, DPT 03/20/2021, 11:27 AM  [place] MAIN [facility] SERVICES [address] [place], [place], [address] Phone: [phone]   Fax:  [phone]  Name: Rachel Torres MRN: [id number] Date of Birth: 26-Oct-1956

## 2021-03-23 ENCOUNTER — Ambulatory Visit: Payer: Medicare HMO

## 2021-03-23 NOTE — Progress Notes (Deleted)
Chronic Care Management Pharmacy Note  03/23/2021 Name:  Rachel Torres MRN:  [id number] DOB:  May 17, 1956  Subjective: Rachel Torres is an 65 y.o. year old female who is a primary patient of Tower, Wynelle Fanny, MD.  The CCM team was consulted for assistance with disease management and care coordination needs.    Attempted to reach patient by telephone for follow up visit in response to provider referral for pharmacy case management and/or care coordination services. Unable to reach patient, left VM. Chart reviewed prior to visit.  Consent to Services:  The patient was given information about Chronic Care Management services, agreed to services, and gave verbal consent prior to initiation of services.  Please see initial visit note for detailed documentation.   Patient Care Team: Tower, Wynelle Fanny, MD as PCP - General (Family Medicine) Debbora Dus, [facility] as Pharmacist (Pharmacist) Starlyn Skeans, MD as Consulting Physician (Family Medicine)  Last [facility] 09/22/20  Recent office visits: 10/21/20 - Tower - Diarrhea, fluids and watch for improvement, labs and urinalysis  Recent consult visits: 03/10/21 - Sports medicine, chronic right shoulder pain  02/26/21 - [name], Weight Management - Scherrie notes Ozempic is helping her portion control. She denies nausea, vomiting, or diarrhea.Rachel Torres is on Lipitor, and she is tolerating it well. She is due for labs. Rachel Torres is on Vit D OTC, and her last level was at goal. 02/09/21 - Sports medicine, chronic right shoulder pain - start Voltaren gel 4x daily PRN pain, Xray, referral for PT 02/02/21 - Caren Leafy Ro, Weight Management - Referral to sports medicine for shoulder pain, constipation - trial miralax OTC 17 gm daily, Start Nystatin  12/30/20 - Caren Beasley, Weight Management - Start Ozempic 0.25 mg weekly  11/25/20 - Caren Beasley, Weight Management 10/29/20- Caren Beasley, Weight Management 10/28/20 Dr. [name] changed Atorvastatin from daily  to [facility], Wednesday and Friday.   Hospital visits: None in previous 6 months  Objective:  Lab Results  Component Value Date   CREATININE 0.91 02/26/2021   BUN 16 02/26/2021   GFR 76.34 10/21/2020   GFRNONAA 68 07/23/2020   GFRAA 78 07/23/2020   NA 137 02/26/2021   K 4.7 02/26/2021   CALCIUM 9.5 02/26/2021   CO2 21 02/26/2021   GLUCOSE 97 02/26/2021    Lab Results  Component Value Date/Time   HGBA1C 5.5 02/26/2021 01:55 PM   HGBA1C 5.7 (H) 10/29/2020 12:11 PM   GFR 76.34 10/21/2020 08:56 AM   GFR 62.57 02/19/2020 01:00 PM    Lab Results  Component Value Date   CHOL 219 (H) 02/26/2021   HDL 64 02/26/2021   LDLCALC 141 (H) 02/26/2021   LDLDIRECT 189.4 10/13/2011   TRIG 77 02/26/2021   CHOLHDL 3.4 02/26/2021    Hepatic Function Latest Ref Rng & Units 02/26/2021 10/21/2020 07/23/2020  Total Protein 6.0 - 8.5 g/dL 7.6 7.5 7.4  Albumin 3.8 - 4.8 g/dL 4.6 4.4 4.7  AST 0 - 40 IU/L _0 ALT 0 - 32 IU/L _1 Alk Phosphatase 44 - 121 IU/L 75 75 82  Total Bilirubin 0.0 - 1.2 mg/dL 0.5 0.6 0.3  Bilirubin, Direct 0.00 - 0.40 mg/dL - - -    Lab Results  Component Value Date/Time   TSH 1.040 05/05/2020 11:53 AM   TSH 0.76 06/07/2019 10:38 AM   FREET4 0.94 05/05/2020 11:53 AM   FREET4 0.81 07/12/2016 02:36 PM    CBC Latest Ref Rng & Units 10/21/2020 02/19/2020 06/07/2019  WBC 4.0 - 10.5 K/uL 6.0 5.8 4.8  Hemoglobin 12.0 - 15.0 g/dL 13.6 13.0 12.8  Hematocrit 36.0 - 46.0 % 40.3 38.5 37.4  Platelets 150.0 - 400.0 K/uL 288.0 284.0 248.0    Lab Results  Component Value Date/Time   VD25OH 62.2 02/26/2021 01:55 PM   VD25OH 59.5 10/29/2020 12:11 PM   VD25OH 69.53 06/07/2019 10:38 AM   VD25OH 73.13 06/26/2018 08:31 AM    Clinical ASCVD: Yes  The 10-year ASCVD risk score Mikey Bussing DC Jr., et al., 2013) is: 8.3%   Values used to calculate the score:     Age: 55 years     Sex: Female     Is Non-Hispanic African American: No     Diabetic: Yes     Tobacco smoker: No      Systolic Blood Pressure: 683 mmHg     Is BP treated: Yes     HDL Cholesterol: 64 mg/dL     Total Cholesterol: 219 mg/dL    Depression screen [facility] 2/9 05/05/2020 06/05/2019  Decreased Interest 3 1  Down, Depressed, Hopeless 1 1  PHQ - 2 Score 4 2  Altered sleeping 2 2  Tired, decreased energy 3 2  Change in appetite 2 2  Feeling bad or failure about yourself  0 0  Trouble concentrating 0 1  Moving slowly or fidgety/restless 1 0  Suicidal thoughts 0 0  PHQ-9 Score 12 9  Difficult doing work/chores Somewhat difficult Not difficult at all  Some recent data might be hidden    Social History   Tobacco Use  Smoking Status Former Smoker  . Packs/day: 0.75  . Years: 1.50  . Pack years: 1.12  . Types: Cigarettes  . Quit date: 12/20/1978  . Years since quitting: 42.2  Smokeless Tobacco Never Used   BP Readings from Last 3 Encounters:  03/10/21 110/74  02/26/21 123/72  02/09/21 131/76   Pulse Readings from Last 3 Encounters:  03/10/21 92  02/26/21 69  02/09/21 73   Wt Readings from Last 3 Encounters:  03/10/21 188 lb 12.8 oz (85.6 kg)  02/26/21 185 lb (83.9 kg)  02/09/21 191 lb (86.6 kg)   BMI Readings from Last 3 Encounters:  03/10/21 31.42 kg/m  02/26/21 30.79 kg/m  02/09/21 31.78 kg/m    Assessment/Interventions: Review of patient past medical history, allergies, medications, health status, including review of consultants reports, laboratory and other test data, was performed as part of comprehensive evaluation and provision of chronic care management services.   SDOH:  (Social Determinants of Health) assessments and interventions performed: Yes  SDOH Screenings   Alcohol Screen: Not on file  Depression (PHQ2-9): Medium Risk  . PHQ-2 Score: 12  Financial Resource Strain: Low Risk   . Difficulty of Paying Living Expenses: Not hard at all  Food Insecurity: Not on file  Housing: Not on file  Physical Activity: Not on file  Social Connections: Not on file  Stress:  Not on file  Tobacco Use: Medium Risk  . Smoking Tobacco Use: Former Smoker  . Smokeless Tobacco Use: Never Used  Transportation Needs: Not on file    CCM Care Plan  Allergies  Allergen Reactions  . Fosamax [Alendronate Sodium]     GI upset   . Ginzing [Ginseng] Other (See Comments)    Hyper and jitery   . Hydrocod Polst-Cpm Polst Er Itching  . Hydrocodone-Acetaminophen Itching  . Imitrex [Sumatriptan]     Contraindicated w/ heart condition  . Oxycodone Itching  .  Peanut-Containing Drug Products Other (See Comments)    Reaction:  Migraines   . Tramadol Other (See Comments)    itching  . Hydrocodone-Guaifenesin Itching  . Prednisone Rash  . Tape Itching    Medications Reviewed Today    Reviewed by Barak, Haley R, PT (Physical Therapist) on 03/20/21 at 0928  Med List Status: <None>  Medication Order Taking? Sig Documenting Provider Last Dose Status Informant  albuterol (PROVENTIL HFA;VENTOLIN HFA) 108 (90 Base) MCG/ACT inhaler [id number] No Inhale 1-2 puffs into the lungs every 6 (six) hours as needed for wheezing or shortness of breath. Clark, Katherine K, NP Taking Active   ALPRAZolam (XANAX) 0.5 MG tablet [id number] No Take 1 tablet (0.5 mg total) by mouth daily as needed for anxiety. Tower, Marne A, MD Taking Active   aspirin EC 81 MG tablet [id number] No Take 81 mg by mouth at bedtime. [provider] Taking Active Self  atorvastatin (LIPITOR) 20 MG tablet [id number] No TAKE 1 TABLET EVERY DAY Gollan, Timothy J, MD Taking Active   carvedilol (COREG) 6.25 MG tablet [id number] No TAKE 1 TABLET TWICE DAILY WITH A MEAL Gollan, Timothy J, MD Taking Active   Cholecalciferol (VITAMIN D3) 5000 units CAPS [id number] No Take 1 capsule by mouth daily. [provider] Taking Active   cyclobenzaprine (FLEXERIL) 10 MG tablet [id number] No Take 0.5-1 tablets (5-10 mg total) by mouth 3 (three) times daily as needed. For headache Tower, Marne A, MD Taking Active   furosemide  (LASIX) 20 MG tablet [id number] No Take 1 tablet (20 mg total) by mouth daily. Take 1 tablet (20 mg) daily, may take additional 1 tablet (20 mg) in the afternoon as needed for swelling, shortness of breath, or weight gain Gollan, Timothy J, MD Taking Expired 10/28/20 2359   Krill Oil (OMEGA-3) 500 MG CAPS [id number] No Take 1 capsule by mouth daily. [provider] Taking Active   lisinopril (ZESTRIL) 5 MG tablet [id number] No TAKE 1/2 TABLET EVERY DAY Gollan, Timothy J, MD Taking Active   magnesium gluconate (MAGONATE) 500 MG tablet [id number] No Take 500 mg by mouth at bedtime. [provider] Taking Active Self  Misc Natural Products (TART CHERRY ADVANCED) CAPS [id number] No Take 1 capsule by mouth daily. [provider] Taking Active   naproxen sodium (ANAPROX) 220 MG tablet [id number] No Take 440 mg by mouth 2 (two) times daily as needed (for headaches/pain). [provider] Taking Active Self           Med Note (NEWCOMER MCCLAIN, BRANDY L   Tue Oct 28, 2020  3:09 PM)    nystatin (MYCOSTATIN/NYSTOP) powder [id number] No Apply 1 application topically 2 (two) times daily. Apply to affected area twice a day for 14 days Beasley, Caren D, MD Taking Active   omeprazole (PRILOSEC) 40 MG capsule [id number] No Take 1 capsule (40 mg total) by mouth daily. Tower, Marne A, MD Taking Active   potassium chloride (KLOR-CON) 10 MEQ tablet [id number] No Take 1 tablet (10 mEq total) by mouth 2 (two) times daily as needed. Take additional pill when increasing Furosemide. Gollan, Timothy J, MD Taking Active   promethazine (PHENERGAN) 25 MG tablet [id number] No Take 0.5 tablets (12.5 mg total) by mouth every 8 (eight) hours as needed for nausea. Tower, Marne A, MD Taking Active   Semaglutide,0.25 or 0.5MG/DOS, (OZEMPIC, 0.25 OR 0.5 MG/DOSE,) 2 MG/1.5ML SOPN [id number]  Inject 0.25 mg into the skin once a week. Aguilar, Tracey, PA-C  Active   Simethicone (MYLANTA   GAS PO) [id number] No Take by  mouth 3 times/day as needed-between meals & bedtime. [provider] Taking Active   venlafaxine XR (EFFEXOR-XR) 150 MG 24 hr capsule [id number] No TAKE 1 CAPSULE EVERY DAY WITH BREAKFAST Tower, Wynelle Fanny, MD Taking Active           Patient Active Problem List   Diagnosis Date Noted  . Abdominal pain, right lateral 10/21/2020  . Acute bronchitis with bronchospasm 05/28/2020  . Diarrhea 04/11/2020  . Epigastric pain 02/19/2020  . Dark stools 02/19/2020  . PAF (paroxysmal atrial fibrillation) ([place]) 02/05/2019  . Shortness of breath 01/27/2019  . Irregular surface of cornea 12/25/2018  . HPV (human papilloma virus) infection 12/01/2016  . Screening mammogram, encounter for 11/29/2016  . Estrogen deficiency 11/29/2016  . Encounter for routine gynecological examination 11/29/2016  . Grade III hemorrhoids 10/06/2016  . Tachycardia 08/14/2016  . Hemorrhoids 08/13/2016  . Atypical migraine 08/03/2016  . CAD (coronary artery disease)   . AAA (abdominal aortic aneurysm) ([place])   . Chronic combined systolic (congestive) and diastolic (congestive) heart failure ([place])   . Prediabetes 02/11/2016  . Generalized anxiety disorder 12/08/2015  . Panic attacks 07/28/2015  . Sensorineural hearing loss 03/27/2013  . Vitamin D deficiency 07/03/2012  . H/O aortic dissection 05/12/2012  . Class 2 obesity due to excess calories with body mass index (BMI) of 35.0 to 35.9 in adult 11/21/2011  . Obesity (BMI 30.0-34.9) 11/21/2011  . Routine general medical examination at a health care facility 09/20/2011  . S/P AVR (aortic valve replacement) 01/07/2011  . CORONARY ARTERY BYPASS GRAFT, HX OF 01/07/2011  . Arthritis 12/20/2010  . Back pain 12/15/2010  . Dissection of thoracic aorta ([place]) 12/11/2010  . H/O dissecting abdominal aortic aneurysm repair 08/24/2010  . IRRITABLE BOWEL SYNDROME 09/09/2009  . Obstructive sleep apnea 08/04/2009  . External hemorrhoid 06/26/2009  . Osteoporosis 01/09/2009   . Hyperlipidemia 07/26/2007  . Depression with anxiety 07/26/2007  . Essential hypertension 07/26/2007  . Allergic rhinitis 07/26/2007  . Asthma 07/26/2007  . GERD 07/26/2007  . Osteoarthritis 07/26/2007  . Ocular migraine 07/26/2007  . Mild intermittent asthma without complication 78/67/6720    Immunization History  Administered Date(s) Administered  . H1N1 01/09/2009  . Influenza Split 09/20/2011, 09/11/2012  . Influenza Whole 12/29/2006, 09/19/2008, 10/08/2010  . Influenza, Seasonal, Injecte, Preservative Fre 10/08/2010  . Influenza,inj,Quad PF,6+ Mos 10/08/2013, 08/13/2016, 10/27/2017, 10/05/2018, 09/17/2019  . Influenza-Unspecified 09/26/2015  . PFIZER(Purple Top)SARS-COV-2 Vaccination 03/12/2020, 04/02/2020, 10/10/2020  . Pneumococcal Polysaccharide-23 12/29/2006  . Td 01/09/2009, 04/03/2019    Conditions to be addressed/monitored:  {USCCMDZASSESSMENTOPTIONS:23563}  There are no care plans that you recently modified to display for this patient.   Current Barriers:  . {pharmacybarriers:24917} . ***  Pharmacist Clinical Goal(s):  Marland Kitchen Patient will {PHARMACYGOALCHOICES:24921} through collaboration with PharmD and provider.  . ***  Interventions: . 1:1 collaboration with Tower, Wynelle Fanny, MD regarding development and update of comprehensive plan of care as evidenced by provider attestation and co-signature . Inter-disciplinary care team collaboration (see longitudinal plan of care) . Comprehensive medication review performed; medication list updated in electronic medical record  Hypertension (BP goal {CHL HP UPSTREAM Pharmacist BP ranges:[phone]}) -{US controlled/uncontrolled:25276} -Current treatment: . *** -Medications previously tried: ***  -Current home readings: *** -Current dietary habits: *** -Current exercise habits: *** -{ACTIONS;DENIES/REPORTS:21021675::"Denies"} hypotensive/hypertensive symptoms -Educated on {CCM BP Counseling:25124} -Counseled to  monitor BP at home ***, document, and provide log at future appointments -{CCMPHARMDINTERVENTION:25122}  Hyperlipidemia: (LDL goal < ***) -{US controlled/uncontrolled:25276} -  Current treatment:  aspirin EC 81 MG tablet - 1 tablet daily  Krill Oil (OMEGA-3) 500 MG CAPS - 1 capsule daily  atorvastatin (LIPITOR) 20 MG tablet - 1 tablet daily (reports taking for 2-3 weeks then off for a week, due to muscle pain)   nitroGLYCERIN (NITROSTAT) 0.4 MG SL tablet - has not needed recently, checks BP prior to using and only uses if BP is high -Medications previously tried: ***  -Current dietary patterns: ***  taking atorvastatin M/W/F to minimize leg cramps. Has had to take a week break occasionally due to leg cramps. Indicates that lipid panel is best in years. She will continue with lifestyle modifications and atorvastatin Monday, Wednesday, Friday -Current exercise habits: *** -Educated on {CCM HLD Counseling:25126} -{CCMPHARMDINTERVENTION:25122}  Heart Failure/HTN (Goal: manage symptoms and prevent exacerbations) -{US controlled/uncontrolled:25276} -Last ejection fraction: *** (Date: ***) -HF type: {type of heart failure:30421350} -NYHA Class: {CHL HP Upstream Pharm NYHA Class:[phone]} -AHA HF Stage: {CHL HP Upstream Pharm AHA HF Stage:[phone]} -Current treatment:  furosemide (LASIX) 20 MG tablet - 1 tablet daily (up to TID PRN)  carvedilol (COREG) 6.25 MG tablet - 1 tablet BID  lisinopril (ZESTRIL) 5 MG tablet - 1 tablet daily  potassium chloride (K-DUR) 10 MEQ tablet - 1 tablet BID PRN -Medications previously tried: ***  -Current home BP/HR readings: *** -Current dietary habits: *** -Current exercise habits: *** -Educated on {CCM HF Counseling:25125} -{CCMPHARMDINTERVENTION:25122}  Depression/Anxiety (Goal: ***) -{US controlled/uncontrolled:25276} -Current treatment: . *** -Medications previously tried/failed: *** -PHQ9: *** -GAD7: *** -Connected with *** for mental  health support -Educated on {CCM mental health counseling:25127} -{CCMPHARMDINTERVENTION:25122}  *** (Goal: ***) -{US controlled/uncontrolled:25276} -Current treatment  . *** -Medications previously tried: ***  -{CCMPHARMDINTERVENTION:25122}   Patient Goals/Self-Care Activities . Patient will:  - {pharmacypatientgoals:24919}  Follow Up Plan: {CM FOLLOW UP QMGQ:67619}   Medication Assistance: {MEDASSISTANCEINFO:25044}  Patient's preferred pharmacy is:  [place], [place] [address] Phone: [phone] Fax: [phone]  [facility] [place], [place] [address] [place] [address] Phone: [phone] Fax: [phone]  [facility] [place], [place] - [address] [place] [address] Phone: [phone] Fax: [place], [place] - [address] [place] [address] Phone: [phone] Fax: [phone] [place], [facility] [address] [place] [address] Phone: [phone] Fax: [phone]  Uses pill box? {Yes or If no, why not?:20788} Pt endorses ***% compliance  We discussed: {Pharmacy options:24294} Patient decided to: {US Pharmacy Plan:23885}  Care Plan and Follow Up Patient Decision:  {FOLLOWUP:24991}  Plan: {CM FOLLOW UP PLAN:25073}  ***

## 2021-03-24 ENCOUNTER — Telehealth: Payer: TRICARE For Life (TFL)

## 2021-03-24 ENCOUNTER — Telehealth: Payer: Self-pay

## 2021-03-25 ENCOUNTER — Ambulatory Visit: Payer: Medicare HMO | Admitting: Psychology

## 2021-03-31 ENCOUNTER — Other Ambulatory Visit: Payer: Self-pay

## 2021-03-31 ENCOUNTER — Ambulatory Visit (INDEPENDENT_AMBULATORY_CARE_PROVIDER_SITE_OTHER): Payer: Medicare HMO | Admitting: Family Medicine

## 2021-03-31 ENCOUNTER — Ambulatory Visit (INDEPENDENT_AMBULATORY_CARE_PROVIDER_SITE_OTHER): Payer: Medicare HMO | Admitting: Psychology

## 2021-03-31 VITALS — BP 106/60 | HR 94 | Temp 97.9°F | Ht 65.0 in | Wt 182.0 lb

## 2021-03-31 DIAGNOSIS — E7849 Other hyperlipidemia: Secondary | ICD-10-CM

## 2021-03-31 DIAGNOSIS — Z6835 Body mass index (BMI) 35.0-35.9, adult: Secondary | ICD-10-CM

## 2021-03-31 DIAGNOSIS — F331 Major depressive disorder, recurrent, moderate: Secondary | ICD-10-CM

## 2021-03-31 DIAGNOSIS — F411 Generalized anxiety disorder: Secondary | ICD-10-CM

## 2021-03-31 DIAGNOSIS — R7303 Prediabetes: Secondary | ICD-10-CM | POA: Diagnosis not present

## 2021-03-31 MED ORDER — OZEMPIC (0.25 OR 0.5 MG/DOSE) 2 MG/1.5ML ~~LOC~~ SOPN: 0.2500 mg | PEN_INJECTOR | SUBCUTANEOUS | 0 refills | Status: DC

## 2021-04-06 ENCOUNTER — Ambulatory Visit: Payer: Medicare HMO

## 2021-04-06 ENCOUNTER — Other Ambulatory Visit: Payer: Self-pay

## 2021-04-06 DIAGNOSIS — M6281 Muscle weakness (generalized): Secondary | ICD-10-CM | POA: Diagnosis not present

## 2021-04-06 DIAGNOSIS — M25511 Pain in right shoulder: Secondary | ICD-10-CM | POA: Diagnosis not present

## 2021-04-06 DIAGNOSIS — R2689 Other abnormalities of gait and mobility: Secondary | ICD-10-CM

## 2021-04-06 DIAGNOSIS — G8929 Other chronic pain: Secondary | ICD-10-CM | POA: Diagnosis not present

## 2021-04-06 DIAGNOSIS — R293 Abnormal posture: Secondary | ICD-10-CM

## 2021-04-06 DIAGNOSIS — R278 Other lack of coordination: Secondary | ICD-10-CM | POA: Diagnosis not present

## 2021-04-06 NOTE — Therapy (Signed)
[place] MAIN [facility] SERVICES [address] [place], [place], [address] Phone: [phone]   Fax:  [phone]  Physical Therapy Treatment  Patient Details  Name: Rachel Torres MRN: [id number] Date of Birth: 1956-05-24 Referring Provider (PT): Gregor Hams, MD   Encounter Date: 04/06/2021   PT End of Session - 04/06/21 1423    Visit Number 6    Number of Visits 17    Date for PT Re-Evaluation 04/21/21    Authorization Type Eval performed 04/22/2705 (recert for 01/22/7627)    PT Start Time 1427    PT Stop Time 1515    PT Time Calculation (min) 48 min    Activity Tolerance Patient tolerated treatment well    Behavior During Therapy [facility] for tasks assessed/performed           Past Medical History:  Diagnosis Date  . AAA (abdominal aortic aneurysm) ([place])    a. Chronic w/o evidence of aneurysmal dil on CTA 01/2016.  Marland Kitchen Alcohol abuse   . Allergy   . Anemia   . Anxiety   . CAD (coronary artery disease)    a. 08/2010 s/p CABG x 1 (VG->RCA) @ time of Ao dissection repair; b. 01/2016 Lexiscan MV: mid antsept/apical defect w/ ? peri-infarct ischemia-->likely attenuation-->Med Rx.  . Chewing difficulty   . Chronic combined systolic (congestive) and diastolic (congestive) heart failure ([place])    a. 2012 EF 30-35%; b. 04/2015 EF 40-45%; c. 01/2016 Echo: Ef 55-65%, nl AoV bioprosthesis, nl RV, nl PASP.  Marland Kitchen Constipation   . Depression   . Edema of both lower extremities   . Gallbladder sludge   . GERD (gastroesophageal reflux disease)   . Heart failure ([place])   . Heart valve problem   . History of Bicuspid Aortic Valve    a. 08/2010 s/p AVR @ time of Ao dissection repair; b. 01/2016 Echo: Ef 55-65%, nl AoV bioprosthesis, nl RV, nl PASP.  Marland Kitchen Hx of repair of dissecting thoracic aortic aneurysm, [place] type A    a. 08/2010 s/p repair with AVR and VG->RCA; b. 01/2016 CTA: stable appearance of Asc Thoracic Aortic graft. Opacification of flase lumen of chronic  abd Ao dissection w/ retrograde flow through lumbar arteries. No aneurysmal dil of flase lumen.  . Hyperlipidemia   . Hypertensive heart disease   . IBS (irritable bowel syndrome)   . Internal hemorrhoids   . Kidney problem   . Marfan syndrome    pt denies  . Migraine   . Obstructive sleep apnea   . Osteoarthritis   . Osteoporosis   . Palpitations   . Pneumonia   . RA (rheumatoid arthritis) ([place])    a. Followed by Dr. [name]    Past Surgical History:  Procedure Laterality Date  . ABDOMINAL AORTIC ANEURYSM REPAIR    . CARDIAC CATHETERIZATION  2001  . [place]  . CHOLECYSTECTOMY    . COLONOSCOPY    . ESOPHAGOGASTRODUODENOSCOPY (EGD) WITH PROPOFOL N/A 05/08/2020   Procedure: ESOPHAGOGASTRODUODENOSCOPY (EGD) WITH PROPOFOL;  Surgeon: Virgel Manifold, MD;  Location: [facility] ENDOSCOPY;  Service: Endoscopy;  Laterality: N/A;  . FRACTURE SURGERY Right    shoulder replacement  . HEMORRHOID BANDING    . ORIF DISTAL RADIUS FRACTURE Left   . UPPER GASTROINTESTINAL ENDOSCOPY      There were no vitals filed for this visit.   Subjective Assessment - 04/06/21 1425    Subjective Pt reports she helped her mother  with moving furniture this weekend. She rates R shoulder pain 4/10. Pt reports she has not taking pain meds but did ice it.    Pertinent History Pt is a 65 y/o female presenting to therapy due to R shoulder pain. Pt had R shoulder replacement 12-13 years ago.  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 She says shoulder pain started this past January as she was helping lift her husband and his assistive devices. She reports she has since stopped lifting him and his walker as they have caregivers who come to the home for 16 hours/week and can help. Pt is most concerned that her shoulder hurts when she drives and washes her hair. Her shoulder does not hurt constantly, but she states she has pain "the more I use it." She feels  a catch and stabbing sensation in R anterior shoulder that can travel into bicep and that sometimes the pain is a burning sensation. She sometimes has pain in L shoulder.  She rates her worst R shoulder pain as 10/10, best pain is 0/10. Her R shoulder pain is currently 1.5/10. She reports that rest, ice and naproxen help with pain relief. She denies redness/swelling/temp difference and N/T in UEs.  Patient has been seen by other therapists in this clinic previously for cervicalgia and back related injuries/pain. Pt PMH includes COVID19, upper GI stomach issues, aorta type A dissection (2011) with aortic valve replacement, bypass of R coronary artery, HTN, hyperlipidemia, pre-diabetes, AFIB, CHF, depression, anxiety, OA, GERD, atypical migraine, asthma, and allergic rhinitis.    Limitations Lifting;House hold activities;Other (comment)   driving   How long can you sit comfortably? Not affected    How long can you stand comfortably? Not affected    How long can you walk comfortably? Pt reports she has pain only if she is carrying something    Diagnostic tests DG R shoulder 02/09/2021 per chart "Impression: Possible hardware loosening post total shoulder arthroplasty. No  evidence of acute fracture or dislocation"    Patient Stated Goals Be able to drive without pain    Currently in Pain? Yes    Pain Score 4     Pain Location Shoulder    Pain Orientation Right    Pain Onset More than a month ago         TREATMENT  Manual:  Pt seated, with STM to B upper traps and cervical paraspinals x approx 13 min. Pt with multiple trigger points throughout R upper trap, R cervical paraspinals, and 2 trigger points in R anterior delt. Pt reports improvement in sx with STM. No adverse reaction to tx.    Therex  Ice to R shoulder x15 minutes while pt performed exercise, seated. Pt with no adverse reaction to treatment. Skin appears WNL once ice pack removed.   Upper trap stretch 2x30 sec B  Scapular squeezes  20x    Levator scap stretch 2x30 sec B; Demo for technique  YTB serratus punches 3x10, Demo, VC/TC for technique   YTB pull aparts 1x12, 2x10, demo/VC/TC for technique; pt rates exercise easy. Using YB instead of RTB for pain modulation.   YTB rows 3x10; VC for technique; pt rates "medium"  Shoulder flexion eccentrics with YTB and PT assist 1x4  Attempted with shoulder abduction of R UE but pt unable d/t discomfort and feeling of onset of "catch."   Bird dogs with alternating arms (flexion) - 1x10, 1x6 with 3 sec hold; discontinued  d/t pain in anterior shoulder/deltoid.  VC/TC for technique   Followed by more scapular squeezes 10x for pain modulation  Cervical extensors stretch x30 sec, PT-assisted    Education provided throughout session via VC/TC and demonstration to facilitate movement at target joints and correct muscle activation for all performed. Patient able to follow cues and return demonstration with good carryover     PT Education - 04/06/21 1717    Education Details Pt educated on exercise technique, body mechanics    Person(s) Educated Patient    Methods Explanation;Demonstration;Verbal cues    Comprehension Verbalized understanding;Returned demonstration            PT Short Term Goals - 02/24/21 1900      PT SHORT TERM GOAL #1   Title Patient will be independent in home exercise program to improve strength/mobility for better functional independence with ADLs.    Baseline 02/24/2021 Pt issued HEP    Time 4    Period Weeks    Status New    Target Date 03/24/21             PT Long Term Goals - 02/24/21 1842      PT LONG TERM GOAL #1   Title Patient will report a worst pain of 3/10  in last 7 days in her R shoulder to improve tolerance with ADLs and reduced symptoms with activities.    Baseline 02/24/2021: Pt reports worst pain as 10/10.    Time 8    Period Weeks    Status New    Target Date 04/21/21      PT LONG TERM GOAL #2   Title Patient will  decrease Quick DASH scores by > 8 points demonstrating reduced self-reported upper extremity disability.    Baseline 02/24/2021: QuickDash Disability/symptom score 40, Work score 37.5    Time 8    Period Weeks    Status New    Target Date 04/21/21      PT LONG TERM GOAL #3   Title Patient will increase FOTO score to equal to or greater than 60 to demonstrate statistically significant improvement in mobility and quality of life.    Baseline 02/24/2021: 55    Time 8    Period Weeks    Status New    Target Date 04/21/21      PT LONG TERM GOAL #4   Title Patient will increase BUE MMT scores by at least 1/2 point as to improve functional strength and endurance for overhead activities and ADLs such as driving and washing her hair.    Baseline 02/24/2021: L/R shoulder flexion 4/5, 3+/5; shoulder abduction 4/5 B; shoulder extension 4+5 B; shoulder IR/ER both grossly 4/5 B; elbow flexion/extension 5/5 for both B; wrist flexion/extension 5/5 for both B    Time 8    Period Weeks    Status New    Target Date 04/21/21             Plan - 04/06/21 1719    Clinical Impression Statement Session focused on pain modulation and reduced resistance with exercise d/t pt reports of moving furniture this past weekend and experiencing increase in R shoulder sx. Pt responds well to STM, with noted trigger points throughout R upper trap, R cervical paraspinals and R anterior delt. Pt did have to limit number of bird dogs and shoulder abduction and flexion eccentrics d/t R shoulder pain, which improved at rest. The pt will benefit from further skilled PT to improve R UE pain,  strength and ROM in order to increase ease with ADLs and QOL.    Personal Factors and Comorbidities Comorbidity 1;Comorbidity 2;Comorbidity 3+;Past/Current Experience;Social Background;Time since onset of injury/illness/exacerbation;Transportation;Fitness;Other    Comorbidities Pertinent: recent hx of COVID19, HTN, hyperlipidemia, pre-diabetes,  AFIB, CHF, depression, anxiety, OA, atypical migraine, asthma.    Examination-Activity Limitations Bathing;Bed Mobility;Hygiene/Grooming;Lift;Caring for Others;Reach Overhead;Carry;Dressing;Sleep;Other    Examination-Participation Restrictions Laundry;Cleaning;Driving;Community Activity;Other;Yard Work;Shop;Meal Prep;Volunteer    Stability/Clinical Decision Making Stable/Uncomplicated    Rehab Potential Good    PT Frequency 2x / week    PT Duration 8 weeks    PT Treatment/Interventions ADLs/Self Care Home Management;Biofeedback;Cryotherapy;Electrical Stimulation;Moist Heat;Traction;Ultrasound;DME Instruction;Functional mobility training;Therapeutic activities;Therapeutic exercise;Neuromuscular re-education;Patient/family education;Orthotic Fit/Training;Manual techniques;Scar mobilization;Passive range of motion;Dry needling;Taping;Energy conservation;Joint Manipulations;Spinal Manipulations;Gait training    PT Next Visit Plan Progress shoulder strengthening exercises; cervical stretches within pain tolerance, shoulder stability exercises/closed kinetic chain, closed kinetic chain push-up plus, continue plan as previously indicated    PT Home Exercise Plan Issued HEP with handout: RTB rows, RTB shoulder ER pull-aparts, and upper trap stretch; shoulder perturbation exercises    Consulted and Agree with Plan of Care Patient           Patient will benefit from skilled therapeutic intervention in order to improve the following deficits and impairments:  Increased fascial restricitons,Improper body mechanics,Pain,Decreased mobility,Increased muscle spasms,Postural dysfunction,Decreased activity tolerance,Decreased endurance,Decreased range of motion,Decreased strength,Hypomobility,Impaired UE functional use,Impaired flexibility,Decreased coordination  Visit Diagnosis: Chronic right shoulder pain  Muscle weakness (generalized)  Other abnormalities of gait and mobility  Abnormal  posture     Problem List Patient Active Problem List   Diagnosis Date Noted  . Abdominal pain, right lateral 10/21/2020  . Acute bronchitis with bronchospasm 05/28/2020  . Diarrhea 04/11/2020  . Epigastric pain 02/19/2020  . Dark stools 02/19/2020  . PAF (paroxysmal atrial fibrillation) ([place]) 02/05/2019  . Shortness of breath 01/27/2019  . Irregular surface of cornea 12/25/2018  . HPV (human papilloma virus) infection 12/01/2016  . Screening mammogram, encounter for 11/29/2016  . Estrogen deficiency 11/29/2016  . Encounter for routine gynecological examination 11/29/2016  . Grade III hemorrhoids 10/06/2016  . Tachycardia 08/14/2016  . Hemorrhoids 08/13/2016  . Atypical migraine 08/03/2016  . CAD (coronary artery disease)   . AAA (abdominal aortic aneurysm) ([place])   . Chronic combined systolic (congestive) and diastolic (congestive) heart failure ([place])   . Prediabetes 02/11/2016  . Generalized anxiety disorder 12/08/2015  . Panic attacks 07/28/2015  . Sensorineural hearing loss 03/27/2013  . Vitamin D deficiency 07/03/2012  . H/O aortic dissection 05/12/2012  . Class 2 obesity due to excess calories with body mass index (BMI) of 35.0 to 35.9 in adult 11/21/2011  . Obesity (BMI 30.0-34.9) 11/21/2011  . Routine general medical examination at a health care facility 09/20/2011  . S/P AVR (aortic valve replacement) 01/07/2011  . CORONARY ARTERY BYPASS GRAFT, HX OF 01/07/2011  . Arthritis 12/20/2010  . Back pain 12/15/2010  . Dissection of thoracic aorta ([place]) 12/11/2010  . H/O dissecting abdominal aortic aneurysm repair 08/24/2010  . IRRITABLE BOWEL SYNDROME 09/09/2009  . Obstructive sleep apnea 08/04/2009  . External hemorrhoid 06/26/2009  . Osteoporosis 01/09/2009  . Hyperlipidemia 07/26/2007  . Depression with anxiety 07/26/2007  . Essential hypertension 07/26/2007  . Allergic rhinitis 07/26/2007  . Asthma 07/26/2007  . GERD 07/26/2007  . Osteoarthritis 07/26/2007  .  Ocular migraine 07/26/2007  . Mild intermittent asthma without complication 63/14/9702   Ricard Dillon PT, DPT 04/06/2021, 5:25 PM  [place] MAIN  [place], [place], [address] Phone: [phone]   Fax:  [phone]  Name: Rachel Torres MRN: [id number] Date of Birth: 1956/01/25

## 2021-04-08 ENCOUNTER — Ambulatory Visit: Payer: Medicare HMO | Admitting: Psychology

## 2021-04-08 NOTE — Progress Notes (Signed)
Chief Complaint:   OBESITY Rachel Torres is here to discuss her progress with her obesity treatment plan along with follow-up of her obesity related diagnoses. Rachel Torres is on following a lower carbohydrate, vegetable and lean protein rich diet plan and states she is following her eating plan approximately 95% of the time. Rachel Torres states she is doing cardio for 5 minutes 3 times per week.  Today's visit was #: 67 Starting weight: 213 lbs Starting date: 05/05/2020 Today's weight: 182 lbs Today's date: 03/31/2021 Total lbs lost to date: 31 Total lbs lost since last in-office visit: 6  Interim History: Rachel Torres continues to do well with weight loss on her low carbohydrate plan. She does have some sweet cravings but she is able to control with less portions.  Subjective:   1. Pre-diabetes Rachel Torres is stable on Ozempic, and she denies signs of hypoglycemia.  2. Other hyperlipidemia Rachel Torres is stable on Lipitor an weight loss. No chest pain or myalgias were noted.  Assessment/Plan:   1. Pre-diabetes Rachel Torres will continue to work on weight loss, exercise, and decreasing simple carbohydrates to help decrease the risk of diabetes. We will refill Ozempic for 1 month.  - Semaglutide,0.25 or 0.5MG /DOS, (OZEMPIC, 0.25 OR 0.5 MG/DOSE,) 2 MG/1.5ML SOPN; Inject 0.25 mg into the skin once a week.  Dispense: 1 mL; Refill: 0  2. Other hyperlipidemia Cardiovascular risk and specific lipid/LDL goals reviewed.  We discussed several lifestyle modifications today. Rachel Torres will continue Lipitor and will continue to work on diet, exercise and weight loss efforts. Orders and follow up as documented in patient record.   3. Obesity with current BMI of 30.3 Rachel Torres is currently in the action stage of change. As such, her goal is to continue with weight loss efforts. She has agreed to following a lower carbohydrate, vegetable and lean protein rich diet plan.   Exercise goals: As is.  Behavioral modification  strategies: increasing lean protein intake.  Rachel Torres has agreed to follow-up with our clinic in 6 weeks. She was informed of the importance of frequent follow-up visits to maximize her success with intensive lifestyle modifications for her multiple health conditions.   Objective:   Blood pressure 106/60, pulse 94, temperature 97.9 F (36.6 C), height 5\' 5"  (1.651 m), weight 182 lb (82.6 kg), SpO2 99 %. Body mass index is 30.29 kg/m.  General: Cooperative, alert, well developed, in no acute distress. HEENT: Conjunctivae and lids unremarkable. Cardiovascular: Regular rhythm.  Lungs: Normal work of breathing. Neurologic: No focal deficits.   Lab Results  Component Value Date   CREATININE 0.91 02/26/2021   BUN 16 02/26/2021   NA 137 02/26/2021   K 4.7 02/26/2021   CL 100 02/26/2021   CO2 21 02/26/2021   Lab Results  Component Value Date   ALT 18 02/26/2021   AST 18 02/26/2021   ALKPHOS 75 02/26/2021   BILITOT 0.5 02/26/2021   Lab Results  Component Value Date   HGBA1C 5.5 02/26/2021   HGBA1C 5.7 (H) 10/29/2020   HGBA1C 5.6 07/23/2020   HGBA1C 5.9 (H) 05/05/2020   HGBA1C 6.0 06/07/2019   Lab Results  Component Value Date   INSULIN 17.3 02/26/2021   INSULIN 13.9 10/29/2020   INSULIN 22.5 07/23/2020   INSULIN 25.2 (H) 05/05/2020   Lab Results  Component Value Date   TSH 1.040 05/05/2020   Lab Results  Component Value Date   CHOL 219 (H) 02/26/2021   HDL 64 02/26/2021   LDLCALC 141 (H) 02/26/2021   LDLDIRECT  189.4 10/13/2011   TRIG 77 02/26/2021   CHOLHDL 3.4 02/26/2021   Lab Results  Component Value Date   WBC 6.0 10/21/2020   HGB 13.6 10/21/2020   HCT 40.3 10/21/2020   MCV 93.6 10/21/2020   PLT 288.0 10/21/2020   Lab Results  Component Value Date   FERRITIN 105.0 12/11/2010    Obesity Behavioral Intervention:   Approximately 15 minutes were spent on the discussion below.  ASK: We discussed the diagnosis of obesity with Rachel Torres today and Rachel Torres  agreed to give [place] permission to discuss obesity behavioral modification therapy today.  ASSESS: Rachel Torres has the diagnosis of obesity and her BMI today is 30.29. Rachel Torres is in the action stage of change.   ADVISE: Rachel Torres was educated on the multiple health risks of obesity as well as the benefit of weight loss to improve her health. She was advised of the need for long term treatment and the importance of lifestyle modifications to improve her current health and to decrease her risk of future health problems.  AGREE: Multiple dietary modification options and treatment options were discussed and Rachel Torres agreed to follow the recommendations documented in the above note.  ARRANGE: Rachel Torres was educated on the importance of frequent visits to treat obesity as outlined per CMS and USPSTF guidelines and agreed to schedule her next follow up appointment today.  Attestation Statements:   Reviewed by clinician on day of visit: allergies, medications, problem list, medical history, surgical history, family history, social history, and previous encounter notes.   I, Trixie Dredge, am acting as transcriptionist for [name], MD.  I have reviewed the above documentation for accuracy and completeness, and I agree with the above. -  [name], MD

## 2021-04-10 ENCOUNTER — Ambulatory Visit: Payer: Medicare HMO

## 2021-04-10 NOTE — Telephone Encounter (Signed)
Chronic Care Management Pharmacy Note  04/10/2021 Name:  Rachel Torres MRN:  [id number] DOB:  1956-12-18  Subjective: Rachel Torres is an 65 y.o. year old female who is a primary patient of Tower, Wynelle Fanny, MD.  The CCM team was consulted for assistance with disease management and care coordination needs.    Attempted to reach patient by telephone for follow up visit in response to provider referral for pharmacy case management and/or care coordination services. Unable to reach patient, left VM. Chart reviewed prior to visit.  Consent to Services:  The patient was given information about Chronic Care Management services, agreed to services, and gave verbal consent prior to initiation of services.  Please see initial visit note for detailed documentation.   Patient Care Team: Tower, Wynelle Fanny, MD as PCP - General (Family Medicine) Debbora Dus, [facility] as Pharmacist (Pharmacist) Starlyn Skeans, MD as Consulting Physician (Family Medicine)  Last [facility] 09/22/20  Recent office visits: 10/21/20 - Tower - Diarrhea, fluids and watch for improvement, labs and urinalysis  Recent consult visits: 03/10/21 - Sports medicine, chronic right shoulder pain  02/26/21 - [name], Weight Management - [place] notes Ozempic is helping her portion control. She denies nausea, vomiting, or diarrhea.Jnai is on Lipitor, and she is tolerating it well. She is due for labs. [name] is on Vit D OTC, and her last level was at goal. 02/09/21 - Sports medicine, chronic right shoulder pain - start Voltaren gel 4x daily PRN pain, Xray, referral for PT 02/02/21 - Caren Leafy Ro, Weight Management - Referral to sports medicine for shoulder pain, constipation - trial miralax OTC 17 gm daily, Start Nystatin  12/30/20 - Caren Beasley, Weight Management - Start Ozempic 0.25 mg weekly  11/25/20 - Caren Beasley, Weight Management 10/29/20- Caren Beasley, Weight Management 10/28/20 Dr. [name] changed Atorvastatin from daily  to [facility], Wednesday and Friday.   Hospital visits: None in previous 6 months  Objective:  Lab Results  Component Value Date   CREATININE 0.91 02/26/2021   BUN 16 02/26/2021   GFR 76.34 10/21/2020   GFRNONAA 68 07/23/2020   GFRAA 78 07/23/2020   NA 137 02/26/2021   K 4.7 02/26/2021   CALCIUM 9.5 02/26/2021   CO2 21 02/26/2021   GLUCOSE 97 02/26/2021    Lab Results  Component Value Date/Time   HGBA1C 5.5 02/26/2021 01:55 PM   HGBA1C 5.7 (H) 10/29/2020 12:11 PM   GFR 76.34 10/21/2020 08:56 AM   GFR 62.57 02/19/2020 01:00 PM    Lab Results  Component Value Date   CHOL 219 (H) 02/26/2021   HDL 64 02/26/2021   LDLCALC 141 (H) 02/26/2021   LDLDIRECT 189.4 10/13/2011   TRIG 77 02/26/2021   CHOLHDL 3.4 02/26/2021    Hepatic Function Latest Ref Rng & Units 02/26/2021 10/21/2020 07/23/2020  Total Protein 6.0 - 8.5 g/dL 7.6 7.5 7.4  Albumin 3.8 - 4.8 g/dL 4.6 4.4 4.7  AST 0 - 40 IU/L _0 ALT 0 - 32 IU/L _1 Alk Phosphatase 44 - 121 IU/L 75 75 82  Total Bilirubin 0.0 - 1.2 mg/dL 0.5 0.6 0.3  Bilirubin, Direct 0.00 - 0.40 mg/dL - - -    Lab Results  Component Value Date/Time   TSH 1.040 05/05/2020 11:53 AM   TSH 0.76 06/07/2019 10:38 AM   FREET4 0.94 05/05/2020 11:53 AM   FREET4 0.81 07/12/2016 02:36 PM    CBC Latest Ref Rng & Units 10/21/2020 02/19/2020 06/07/2019  WBC 4.0 - 10.5 K/uL 6.0 5.8 4.8  Hemoglobin 12.0 - 15.0 g/dL 13.6 13.0 12.8  Hematocrit 36.0 - 46.0 % 40.3 38.5 37.4  Platelets 150.0 - 400.0 K/uL 288.0 284.0 248.0    Lab Results  Component Value Date/Time   VD25OH 62.2 02/26/2021 01:55 PM   VD25OH 59.5 10/29/2020 12:11 PM   VD25OH 69.53 06/07/2019 10:38 AM   VD25OH 73.13 06/26/2018 08:31 AM    Clinical ASCVD: Yes  The 10-year ASCVD risk score Mikey Bussing DC Jr., et al., 2013) is: 7.8%   Values used to calculate the score:     Age: 78 years     Sex: Female     Is Non-Hispanic African American: No     Diabetic: Yes     Tobacco smoker: No      Systolic Blood Pressure: 941 mmHg     Is BP treated: Yes     HDL Cholesterol: 64 mg/dL     Total Cholesterol: 219 mg/dL    Depression screen [facility] 2/9 05/05/2020 06/05/2019  Decreased Interest 3 1  Down, Depressed, Hopeless 1 1  PHQ - 2 Score 4 2  Altered sleeping 2 2  Tired, decreased energy 3 2  Change in appetite 2 2  Feeling bad or failure about yourself  0 0  Trouble concentrating 0 1  Moving slowly or fidgety/restless 1 0  Suicidal thoughts 0 0  PHQ-9 Score 12 9  Difficult doing work/chores Somewhat difficult Not difficult at all  Some recent data might be hidden    Social History   Tobacco Use  Smoking Status Former Smoker  . Packs/day: 0.75  . Years: 1.50  . Pack years: 1.12  . Types: Cigarettes  . Quit date: 12/20/1978  . Years since quitting: 42.3  Smokeless Tobacco Never Used   BP Readings from Last 3 Encounters:  03/31/21 106/60  03/10/21 110/74  02/26/21 123/72   Pulse Readings from Last 3 Encounters:  03/31/21 94  03/10/21 92  02/26/21 69   Wt Readings from Last 3 Encounters:  03/31/21 182 lb (82.6 kg)  03/10/21 188 lb 12.8 oz (85.6 kg)  02/26/21 185 lb (83.9 kg)   BMI Readings from Last 3 Encounters:  03/31/21 30.29 kg/m  03/10/21 31.42 kg/m  02/26/21 30.79 kg/m    Assessment/Interventions: Review of patient past medical history, allergies, medications, health status, including review of consultants reports, laboratory and other test data, was performed as part of comprehensive evaluation and provision of chronic care management services.   SDOH:  (Social Determinants of Health) assessments and interventions performed: No  SDOH Screenings   Alcohol Screen: Not on file  Depression (PHQ2-9): Medium Risk  . PHQ-2 Score: 12  Financial Resource Strain: Low Risk   . Difficulty of Paying Living Expenses: Not hard at all  Food Insecurity: Not on file  Housing: Not on file  Physical Activity: Not on file  Social Connections: Not on file  Stress:  Not on file  Tobacco Use: Medium Risk  . Smoking Tobacco Use: Former Smoker  . Smokeless Tobacco Use: Never Used  Transportation Needs: Not on file    CCM Care Plan  Allergies  Allergen Reactions  . Fosamax [Alendronate Sodium]     GI upset   . Ginzing [Ginseng] Other (See Comments)    Hyper and jitery   . Hydrocod Polst-Cpm Polst Er Itching  . Hydrocodone-Acetaminophen Itching  . Imitrex [Sumatriptan]     Contraindicated w/ heart condition  . Oxycodone Itching  .  Peanut-Containing Drug Products Other (See Comments)    Reaction:  Migraines   . Tramadol Other (See Comments)    itching  . Hydrocodone-Guaifenesin Itching  . Prednisone Rash  . Tape Itching    Medications Reviewed Today    Reviewed by Barak, Haley R, PT (Physical Therapist) on 03/20/21 at 0928  Med List Status: <None>  Medication Order Taking? Sig Documenting Provider Last Dose Status Informant  albuterol (PROVENTIL HFA;VENTOLIN HFA) 108 (90 Base) MCG/ACT inhaler [id number] No Inhale 1-2 puffs into the lungs every 6 (six) hours as needed for wheezing or shortness of breath. Clark, Katherine K, NP Taking Active   ALPRAZolam (XANAX) 0.5 MG tablet [id number] No Take 1 tablet (0.5 mg total) by mouth daily as needed for anxiety. Tower, Marne A, MD Taking Active   aspirin EC 81 MG tablet [id number] No Take 81 mg by mouth at bedtime. [provider] Taking Active Self  atorvastatin (LIPITOR) 20 MG tablet [id number] No TAKE 1 TABLET EVERY DAY Gollan, Timothy J, MD Taking Active   carvedilol (COREG) 6.25 MG tablet [id number] No TAKE 1 TABLET TWICE DAILY WITH A MEAL Gollan, Timothy J, MD Taking Active   Cholecalciferol (VITAMIN D3) 5000 units CAPS [id number] No Take 1 capsule by mouth daily. [provider] Taking Active   cyclobenzaprine (FLEXERIL) 10 MG tablet [id number] No Take 0.5-1 tablets (5-10 mg total) by mouth 3 (three) times daily as needed. For headache Tower, Marne A, MD Taking Active   furosemide  (LASIX) 20 MG tablet [id number] No Take 1 tablet (20 mg total) by mouth daily. Take 1 tablet (20 mg) daily, may take additional 1 tablet (20 mg) in the afternoon as needed for swelling, shortness of breath, or weight gain Gollan, Timothy J, MD Taking Expired 10/28/20 2359   Krill Oil (OMEGA-3) 500 MG CAPS [id number] No Take 1 capsule by mouth daily. [provider] Taking Active   lisinopril (ZESTRIL) 5 MG tablet [id number] No TAKE 1/2 TABLET EVERY DAY Gollan, Timothy J, MD Taking Active   magnesium gluconate (MAGONATE) 500 MG tablet [id number] No Take 500 mg by mouth at bedtime. [provider] Taking Active Self  Misc Natural Products (TART CHERRY ADVANCED) CAPS [id number] No Take 1 capsule by mouth daily. [provider] Taking Active   naproxen sodium (ANAPROX) 220 MG tablet [id number] No Take 440 mg by mouth 2 (two) times daily as needed (for headaches/pain). [provider] Taking Active Self           Med Note (NEWCOMER MCCLAIN, BRANDY L   Tue Oct 28, 2020  3:09 PM)    nystatin (MYCOSTATIN/NYSTOP) powder [id number] No Apply 1 application topically 2 (two) times daily. Apply to affected area twice a day for 14 days Beasley, Caren D, MD Taking Active   omeprazole (PRILOSEC) 40 MG capsule [id number] No Take 1 capsule (40 mg total) by mouth daily. Tower, Marne A, MD Taking Active   potassium chloride (KLOR-CON) 10 MEQ tablet [id number] No Take 1 tablet (10 mEq total) by mouth 2 (two) times daily as needed. Take additional pill when increasing Furosemide. Gollan, Timothy J, MD Taking Active   promethazine (PHENERGAN) 25 MG tablet [id number] No Take 0.5 tablets (12.5 mg total) by mouth every 8 (eight) hours as needed for nausea. Tower, Marne A, MD Taking Active   Semaglutide,0.25 or 0.5MG/DOS, (OZEMPIC, 0.25 OR 0.5 MG/DOSE,) 2 MG/1.5ML SOPN [id number]  Inject 0.25 mg into the skin once a week. Aguilar, Tracey, PA-C  Active   Simethicone (MYLANTA   GAS PO) [id number] No Take by  mouth 3 times/day as needed-between meals & bedtime. [provider] Taking Active   venlafaxine XR (EFFEXOR-XR) 150 MG 24 hr capsule [id number] No TAKE 1 CAPSULE EVERY DAY WITH BREAKFAST Tower, Wynelle Fanny, MD Taking Active           Patient Active Problem List   Diagnosis Date Noted  . Abdominal pain, right lateral 10/21/2020  . Acute bronchitis with bronchospasm 05/28/2020  . Diarrhea 04/11/2020  . Epigastric pain 02/19/2020  . Dark stools 02/19/2020  . PAF (paroxysmal atrial fibrillation) ([place]) 02/05/2019  . Shortness of breath 01/27/2019  . Irregular surface of cornea 12/25/2018  . HPV (human papilloma virus) infection 12/01/2016  . Screening mammogram, encounter for 11/29/2016  . Estrogen deficiency 11/29/2016  . Encounter for routine gynecological examination 11/29/2016  . Grade III hemorrhoids 10/06/2016  . Tachycardia 08/14/2016  . Hemorrhoids 08/13/2016  . Atypical migraine 08/03/2016  . CAD (coronary artery disease)   . AAA (abdominal aortic aneurysm) ([place])   . Chronic combined systolic (congestive) and diastolic (congestive) heart failure ([place])   . Prediabetes 02/11/2016  . Generalized anxiety disorder 12/08/2015  . Panic attacks 07/28/2015  . Sensorineural hearing loss 03/27/2013  . Vitamin D deficiency 07/03/2012  . H/O aortic dissection 05/12/2012  . Class 2 obesity due to excess calories with body mass index (BMI) of 35.0 to 35.9 in adult 11/21/2011  . Obesity (BMI 30.0-34.9) 11/21/2011  . Routine general medical examination at a health care facility 09/20/2011  . S/P AVR (aortic valve replacement) 01/07/2011  . CORONARY ARTERY BYPASS GRAFT, HX OF 01/07/2011  . Arthritis 12/20/2010  . Back pain 12/15/2010  . Dissection of thoracic aorta ([place]) 12/11/2010  . H/O dissecting abdominal aortic aneurysm repair 08/24/2010  . IRRITABLE BOWEL SYNDROME 09/09/2009  . Obstructive sleep apnea 08/04/2009  . External hemorrhoid 06/26/2009  . Osteoporosis 01/09/2009   . Hyperlipidemia 07/26/2007  . Depression with anxiety 07/26/2007  . Essential hypertension 07/26/2007  . Allergic rhinitis 07/26/2007  . Asthma 07/26/2007  . GERD 07/26/2007  . Osteoarthritis 07/26/2007  . Ocular migraine 07/26/2007  . Mild intermittent asthma without complication 56/43/3295    Immunization History  Administered Date(s) Administered  . H1N1 01/09/2009  . Influenza Split 09/20/2011, 09/11/2012  . Influenza Whole 12/29/2006, 09/19/2008, 10/08/2010  . Influenza, Seasonal, Injecte, Preservative Fre 10/08/2010  . Influenza,inj,Quad PF,6+ Mos 10/08/2013, 08/13/2016, 10/27/2017, 10/05/2018, 09/17/2019  . Influenza-Unspecified 09/26/2015  . PFIZER(Purple Top)SARS-COV-2 Vaccination 03/12/2020, 04/02/2020, 10/10/2020  . Pneumococcal Polysaccharide-23 12/29/2006  . Td 01/09/2009, 04/03/2019    Conditions to be addressed/monitored:  Hypertension, Hyperlipidemia, Heart Failure and Pre-diabetes  Hypertension (BP goal <130/80) -Current treatment: . Carvedilol 6.25 mg - 1 tablet twice daily with meal . Lisinopril 5 mg - 1/2 tablet daily  -Medications previously tried: none -Patient checks BP at home 1-2x per week  Hyperlipidemia: (LDL goal < 70) -Uncontrolled - LDL 141 -Current treatment:  aspirin EC 81 MG tablet - 1 tablet daily  Krill Oil (OMEGA-3) 500 MG CAPS - 1 capsule daily  atorvastatin (LIPITOR) 20 MG tablet - 1 tablet daily (per med list)  Per history patient taking 1 tablet on Monday, Wed, Friday only  -Medications previously tried: daily atorvastatin, muscle cramps   Heart Failure (Goal: manage symptoms and prevent exacerbations) -Unable to assess -Last ejection fraction: 45-50% (Date: 07/2019) -HF type: Combined Systolic and Diastolic -NYHA Class: II (slight limitation of activity) -AHA HF Stage: C (Heart disease and symptoms present) -Current treatment:  furosemide (  LASIX) 20 MG tablet - 1 tablet daily and an additional tablet daily as needed  for swelling  carvedilol (COREG) 6.25 MG tablet - 1 tablet twice daily with meal  lisinopril (ZESTRIL) 5 MG tablet - 1/2 tablet daily  potassium chloride (K-DUR) 10 MEQ tablet - 1 tablet BID PRN -Medications previously tried: none  Follow Up Plan:  -CMA call for HTN and cholesterol review next month - please verify statin dose, need to update rx based on how patient is taking. -Reschedule CCM visit for June 2022  Medication Assistance: Unable to assess  Patient's preferred pharmacy is: [facility] - [address], [address] [place] [address] Phone: [phone] Fax: [phone]  [id number] [place], [place] - [address] [place] [address] Phone: [phone] Fax: [phone]  Pharmacy: [facility] (maintenance medications - no copay) [name] (acute meds) ([employer])  Debbora Dus, PharmD Clinical Pharmacist [facility] [phone]

## 2021-04-14 ENCOUNTER — Other Ambulatory Visit: Payer: Self-pay

## 2021-04-14 ENCOUNTER — Ambulatory Visit (INDEPENDENT_AMBULATORY_CARE_PROVIDER_SITE_OTHER): Payer: Medicare HMO | Admitting: Psychology

## 2021-04-14 ENCOUNTER — Ambulatory Visit: Payer: Medicare HMO

## 2021-04-14 DIAGNOSIS — M6281 Muscle weakness (generalized): Secondary | ICD-10-CM | POA: Diagnosis not present

## 2021-04-14 DIAGNOSIS — M069 Rheumatoid arthritis, unspecified: Secondary | ICD-10-CM | POA: Diagnosis not present

## 2021-04-14 DIAGNOSIS — F411 Generalized anxiety disorder: Secondary | ICD-10-CM

## 2021-04-14 DIAGNOSIS — R293 Abnormal posture: Secondary | ICD-10-CM

## 2021-04-14 DIAGNOSIS — G8929 Other chronic pain: Secondary | ICD-10-CM

## 2021-04-14 DIAGNOSIS — R278 Other lack of coordination: Secondary | ICD-10-CM | POA: Diagnosis not present

## 2021-04-14 DIAGNOSIS — R2689 Other abnormalities of gait and mobility: Secondary | ICD-10-CM | POA: Diagnosis not present

## 2021-04-14 DIAGNOSIS — F331 Major depressive disorder, recurrent, moderate: Secondary | ICD-10-CM

## 2021-04-14 DIAGNOSIS — M25511 Pain in right shoulder: Secondary | ICD-10-CM | POA: Diagnosis not present

## 2021-04-14 NOTE — Therapy (Signed)
[place] MAIN [facility] SERVICES [address] [place], [place], [address] Phone: [phone]   Fax:  [phone]  Physical Therapy Treatment  Patient Details  Name: Rachel Torres MRN: [id number] Date of Birth: [birth year] Referring Provider (PT): Gregor Hams, MD   Encounter Date: 04/14/2021   PT End of Session - 04/14/21 1624    Visit Number 7    Number of Visits 17    Date for PT Re-Evaluation 04/21/21    Authorization Type Eval performed 08/22/8181 (recert for 08/29/3715)    PT Start Time 1101    PT Stop Time 1148    PT Time Calculation (min) 47 min    Activity Tolerance Patient tolerated treatment well    Behavior During Therapy [facility] for tasks assessed/performed           Past Medical History:  Diagnosis Date  . AAA (abdominal aortic aneurysm) ([place])    a. Chronic w/o evidence of aneurysmal dil on CTA 01/2016.  Marland Kitchen Alcohol abuse   . Allergy   . Anemia   . Anxiety   . CAD (coronary artery disease)    a. 08/2010 s/p CABG x 1 (VG->RCA) @ time of Ao dissection repair; b. 01/2016 Lexiscan MV: mid antsept/apical defect w/ ? peri-infarct ischemia-->likely attenuation-->Med Rx.  . Chewing difficulty   . Chronic combined systolic (congestive) and diastolic (congestive) heart failure ([place])    a. 2012 EF 30-35%; b. 04/2015 EF 40-45%; c. 01/2016 Echo: Ef 55-65%, nl AoV bioprosthesis, nl RV, nl PASP.  Marland Kitchen Constipation   . Depression   . Edema of both lower extremities   . Gallbladder sludge   . GERD (gastroesophageal reflux disease)   . Heart failure ([place])   . Heart valve problem   . History of Bicuspid Aortic Valve    a. 08/2010 s/p AVR @ time of Ao dissection repair; b. 01/2016 Echo: Ef 55-65%, nl AoV bioprosthesis, nl RV, nl PASP.  Marland Kitchen Hx of repair of dissecting thoracic aortic aneurysm, [place] type A    a. 08/2010 s/p repair with AVR and VG->RCA; b. 01/2016 CTA: stable appearance of Asc Thoracic Aortic graft. Opacification of flase lumen of chronic  abd Ao dissection w/ retrograde flow through lumbar arteries. No aneurysmal dil of flase lumen.  . Hyperlipidemia   . Hypertensive heart disease   . IBS (irritable bowel syndrome)   . Internal hemorrhoids   . Kidney problem   . Marfan syndrome    pt denies  . Migraine   . Obstructive sleep apnea   . Osteoarthritis   . Osteoporosis   . Palpitations   . Pneumonia   . RA (rheumatoid arthritis) ([place])    a. Followed by Dr. [name]    Past Surgical History:  Procedure Laterality Date  . ABDOMINAL AORTIC ANEURYSM REPAIR    . CARDIAC CATHETERIZATION  2001  . [place]  . CHOLECYSTECTOMY    . COLONOSCOPY    . ESOPHAGOGASTRODUODENOSCOPY (EGD) WITH PROPOFOL N/A 05/08/2020   Procedure: ESOPHAGOGASTRODUODENOSCOPY (EGD) WITH PROPOFOL;  Surgeon: Virgel Manifold, MD;  Location: [facility] ENDOSCOPY;  Service: Endoscopy;  Laterality: N/A;  . FRACTURE SURGERY Right    shoulder replacement  . HEMORRHOID BANDING    . ORIF DISTAL RADIUS FRACTURE Left   . UPPER GASTROINTESTINAL ENDOSCOPY      There were no vitals filed for this visit.   Subjective Assessment - 04/14/21 1620    Subjective Pt says R shoulder pain is  currently 1/10. She says it has overall improved. Pt was able to wash her hair without increased pain.    Pertinent History Pt is a 65 y/o female presenting to therapy due to R shoulder pain. Pt had R shoulder replacement 12-13 years ago.  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 She says shoulder pain started this past January as she was helping lift her husband and his assistive devices. She reports she has since stopped lifting him and his walker as they have caregivers who come to the home for 16 hours/week and can help. Pt is most concerned that her shoulder hurts when she drives and washes her hair. Her shoulder does not hurt constantly, but she states she has pain "the more I use it." She feels a catch and stabbing  sensation in R anterior shoulder that can travel into bicep and that sometimes the pain is a burning sensation. She sometimes has pain in L shoulder.  She rates her worst R shoulder pain as 10/10, best pain is 0/10. Her R shoulder pain is currently 1.5/10. She reports that rest, ice and naproxen help with pain relief. She denies redness/swelling/temp difference and N/T in UEs.  Patient has been seen by other therapists in this clinic previously for cervicalgia and back related injuries/pain. Pt PMH includes COVID19, upper GI stomach issues, aorta type A dissection (2011) with aortic valve replacement, bypass of R coronary artery, HTN, hyperlipidemia, pre-diabetes, AFIB, CHF, depression, anxiety, OA, GERD, atypical migraine, asthma, and allergic rhinitis.    Limitations Lifting;House hold activities;Other (comment)   driving   How long can you sit comfortably? Not affected    How long can you stand comfortably? Not affected    How long can you walk comfortably? Pt reports she has pain only if she is carrying something    Diagnostic tests DG R shoulder 02/09/2021 per chart "Impression: Possible hardware loosening post total shoulder arthroplasty. No  evidence of acute fracture or dislocation"    Patient Stated Goals Be able to drive without pain    Currently in Pain? Yes    Pain Score 1     Pain Location Shoulder    Pain Orientation Right    Pain Onset More than a month ago            TREATMENT -  Ice to R shoulder x8 minutes while pt performed exercise. Pt with no adverse reaction to treatment. Skin appears WNL once ice pack removed.    Manual: Pt seated, with STM to R upper trap  and R cervical paraspinals x approx 14 min (followed by another 5 min at end of session). Pt with a couple trigger points in R upper trap, R cervical paraspinal near C3, and in ant. delt. Pt reports improvement in sx with STM. No adverse reaction to tx.    Therex   Cable machine rows - 7.5# for 2x10, 12.5# for 1x10  BUEs. No pain with exercise. VC/TC for technique.  Thoracic ext. over chair  - 2x10 with 1-2 sec hold. Demo/VC for technique  Seated chin tucks  - 2x10 with 3 sec holds, PT applied manual resistance. Demo/VC/TC for technique.    Cable machine L shoulder IR - 2.5# - 2x10. VC for technique.  RTB RUE shoulder IR 3x10; VC for technique.  RTB LUE shoulder IR 1x10; VC for technique.  RTB shoulder ER - 3x10 BUEs. Brief demo/VC for technique. No pain with exercise.  Pt supine R UE perturbations - 4x30 sec bouts. VC for technique. No pain with exercise.  Bird-dog with alternating UEs and LEs - pt attempts a few reps but reports increased R shoulder pain with RUE as support UE. Discontinued exercise and provided more STM to R shoulder musculature (see above).   Education provided throughout in the form of demo, VC to facilitate movement at target joints and correct muscle activation with exercises. Pt demonstrates good carryover within session with cues.   PT Education - 04/14/21 1624    Education Details exercise technique, body mechanics with bird-dog pression (coordinating UEs and LEs)    Person(s) Educated Patient    Methods Explanation;Verbal cues;Tactile cues    Comprehension Verbalized understanding;Returned demonstration            PT Short Term Goals - 02/24/21 1900      PT SHORT TERM GOAL #1   Title Patient will be independent in home exercise program to improve strength/mobility for better functional independence with ADLs.    Baseline 02/24/2021 Pt issued HEP    Time 4    Period Weeks    Status New    Target Date 03/24/21             PT Long Term Goals - 02/24/21 1842      PT LONG TERM GOAL #1   Title Patient will report a worst pain of 3/10  in last 7 days in her R shoulder to improve tolerance with ADLs and reduced symptoms with activities.    Baseline 02/24/2021: Pt reports worst pain as 10/10.    Time 8    Period Weeks    Status New    Target Date 04/21/21       PT LONG TERM GOAL #2   Title Patient will decrease Quick DASH scores by > 8 points demonstrating reduced self-reported upper extremity disability.    Baseline 02/24/2021: QuickDash Disability/symptom score 40, Work score 37.5    Time 8    Period Weeks    Status New    Target Date 04/21/21      PT LONG TERM GOAL #3   Title Patient will increase FOTO score to equal to or greater than 60 to demonstrate statistically significant improvement in mobility and quality of life.    Baseline 02/24/2021: 55    Time 8    Period Weeks    Status New    Target Date 04/21/21      PT LONG TERM GOAL #4   Title Patient will increase BUE MMT scores by at least 1/2 point as to improve functional strength and endurance for overhead activities and ADLs such as driving and washing her hair.    Baseline 02/24/2021: L/R shoulder flexion 4/5, 3+/5; shoulder abduction 4/5 B; shoulder extension 4+5 B; shoulder IR/ER both grossly 4/5 B; elbow flexion/extension 5/5 for both B; wrist flexion/extension 5/5 for both B    Time 8    Period Weeks    Status New    Target Date 04/21/21                 Plan - 04/14/21 1627    Clinical Impression Statement Pt reports overall improvement in R shoulder sx since previous session and was able to wash her hair without pain. Pt was also able to perform rows at cable machine with increased resistance. Although pt demonstrates improvement, pt attempted progression of bird-dog to alternating UEs and LEs, and pt had increased pain in R  shoulder. Exercise was discontinued and further STM was provided for pain modulation to R shoulder and cervical paraspinal musculature. The pt will benefit from further skilled PT to improve pain, UE strength and ROM in order to increase ease with ADLs and QOL.    Personal Factors and Comorbidities Comorbidity 1;Comorbidity 2;Comorbidity 3+;Past/Current Experience;Social Background;Time since onset of  injury/illness/exacerbation;Transportation;Fitness;Other    Comorbidities Pertinent: recent hx of COVID19, HTN, hyperlipidemia, pre-diabetes, AFIB, CHF, depression, anxiety, OA, atypical migraine, asthma.    Examination-Activity Limitations Bathing;Bed Mobility;Hygiene/Grooming;Lift;Caring for Others;Reach Overhead;Carry;Dressing;Sleep;Other    Examination-Participation Restrictions Laundry;Cleaning;Driving;Community Activity;Other;Yard Work;Shop;Meal Prep;Volunteer    Stability/Clinical Decision Making Stable/Uncomplicated    Rehab Potential Good    PT Frequency 2x / week    PT Duration 8 weeks    PT Treatment/Interventions ADLs/Self Care Home Management;Biofeedback;Cryotherapy;Electrical Stimulation;Moist Heat;Traction;Ultrasound;DME Instruction;Functional mobility training;Therapeutic activities;Therapeutic exercise;Neuromuscular re-education;Patient/family education;Orthotic Fit/Training;Manual techniques;Scar mobilization;Passive range of motion;Dry needling;Taping;Energy conservation;Joint Manipulations;Spinal Manipulations;Gait training    PT Next Visit Plan Progress shoulder strengthening exercises; cervical stretches within pain tolerance, shoulder stability exercises/closed kinetic chain, closed kinetic chain push-up plus, continue plan as previously indicated    PT Home Exercise Plan Issued HEP with handout: RTB rows, RTB shoulder ER pull-aparts, and upper trap stretch; shoulder perturbation exercises    Consulted and Agree with Plan of Care Patient           Patient will benefit from skilled therapeutic intervention in order to improve the following deficits and impairments:  Increased fascial restricitons,Improper body mechanics,Pain,Decreased mobility,Increased muscle spasms,Postural dysfunction,Decreased activity tolerance,Decreased endurance,Decreased range of motion,Decreased strength,Hypomobility,Impaired UE functional use,Impaired flexibility,Decreased coordination  Visit  Diagnosis: Muscle weakness (generalized)  Chronic right shoulder pain  Other lack of coordination  Abnormal posture     Problem List Patient Active Problem List   Diagnosis Date Noted  . Abdominal pain, right lateral 10/21/2020  . Acute bronchitis with bronchospasm 05/28/2020  . Diarrhea 04/11/2020  . Epigastric pain 02/19/2020  . Dark stools 02/19/2020  . PAF (paroxysmal atrial fibrillation) ([place]) 02/05/2019  . Shortness of breath 01/27/2019  . Irregular surface of cornea 12/25/2018  . HPV (human papilloma virus) infection 12/01/2016  . Screening mammogram, encounter for 11/29/2016  . Estrogen deficiency 11/29/2016  . Encounter for routine gynecological examination 11/29/2016  . Grade III hemorrhoids 10/06/2016  . Tachycardia 08/14/2016  . Hemorrhoids 08/13/2016  . Atypical migraine 08/03/2016  . CAD (coronary artery disease)   . AAA (abdominal aortic aneurysm) ([place])   . Chronic combined systolic (congestive) and diastolic (congestive) heart failure ([place])   . Prediabetes 02/11/2016  . Generalized anxiety disorder 12/08/2015  . Panic attacks 07/28/2015  . Sensorineural hearing loss 03/27/2013  . Vitamin D deficiency 07/03/2012  . H/O aortic dissection 05/12/2012  . Class 2 obesity due to excess calories with body mass index (BMI) of 35.0 to 35.9 in adult 11/21/2011  . Obesity (BMI 30.0-34.9) 11/21/2011  . Routine general medical examination at a health care facility 09/20/2011  . S/P AVR (aortic valve replacement) 01/07/2011  . CORONARY ARTERY BYPASS GRAFT, HX OF 01/07/2011  . Arthritis 12/20/2010  . Back pain 12/15/2010  . Dissection of thoracic aorta ([place]) 12/11/2010  . H/O dissecting abdominal aortic aneurysm repair 08/24/2010  . IRRITABLE BOWEL SYNDROME 09/09/2009  . Obstructive sleep apnea 08/04/2009  . External hemorrhoid 06/26/2009  . Osteoporosis 01/09/2009  . Hyperlipidemia 07/26/2007  . Depression with anxiety 07/26/2007  . Essential hypertension  07/26/2007  . Allergic rhinitis 07/26/2007  . Asthma 07/26/2007  . GERD 07/26/2007  . Osteoarthritis 07/26/2007  .  Ocular migraine 07/26/2007  . Mild intermittent asthma without complication [id number]   Ricard Dillon PT, DPT 04/14/2021, 4:40 PM  [place] MAIN [facility] SERVICES [address] [place], [place], [address] Phone: [phone]   Fax:  [phone]  Name: CORREN GAINEY MRN: [id number] Date of Birth: [birth year]

## 2021-04-17 ENCOUNTER — Other Ambulatory Visit: Payer: Self-pay

## 2021-04-17 ENCOUNTER — Ambulatory Visit: Payer: Medicare HMO

## 2021-04-17 DIAGNOSIS — G8929 Other chronic pain: Secondary | ICD-10-CM | POA: Diagnosis not present

## 2021-04-17 DIAGNOSIS — R278 Other lack of coordination: Secondary | ICD-10-CM | POA: Diagnosis not present

## 2021-04-17 DIAGNOSIS — M25511 Pain in right shoulder: Secondary | ICD-10-CM | POA: Diagnosis not present

## 2021-04-17 DIAGNOSIS — R293 Abnormal posture: Secondary | ICD-10-CM | POA: Diagnosis not present

## 2021-04-17 DIAGNOSIS — M6281 Muscle weakness (generalized): Secondary | ICD-10-CM

## 2021-04-17 DIAGNOSIS — R2689 Other abnormalities of gait and mobility: Secondary | ICD-10-CM | POA: Diagnosis not present

## 2021-04-17 NOTE — Therapy (Signed)
[place] MAIN [facility] SERVICES [address] [place], [place], [address] Phone: [phone]   Fax:  [phone]  Physical Therapy Treatment  Patient Details  Name: Rachel Torres MRN: [id number] Date of Birth: Aug 23, 1956 Referring Provider (PT): Gregor Hams, MD   Encounter Date: 04/17/2021   PT End of Session - 04/17/21 0929    Visit Number 8    Number of Visits 17    Date for PT Re-Evaluation 04/21/21    Authorization Type Eval performed 02/23/6439 (recert for 02/21/7424)    PT Start Time 0826    PT Stop Time 0910    PT Time Calculation (min) 44 min    Activity Tolerance Patient tolerated treatment well    Behavior During Therapy [facility] for tasks assessed/performed           Past Medical History:  Diagnosis Date  . AAA (abdominal aortic aneurysm) ([place])    a. Chronic w/o evidence of aneurysmal dil on CTA 01/2016.  Marland Kitchen Alcohol abuse   . Allergy   . Anemia   . Anxiety   . CAD (coronary artery disease)    a. 08/2010 s/p CABG x 1 (VG->RCA) @ time of Ao dissection repair; b. 01/2016 Lexiscan MV: mid antsept/apical defect w/ ? peri-infarct ischemia-->likely attenuation-->Med Rx.  . Chewing difficulty   . Chronic combined systolic (congestive) and diastolic (congestive) heart failure ([place])    a. 2012 EF 30-35%; b. 04/2015 EF 40-45%; c. 01/2016 Echo: Ef 55-65%, nl AoV bioprosthesis, nl RV, nl PASP.  Marland Kitchen Constipation   . Depression   . Edema of both lower extremities   . Gallbladder sludge   . GERD (gastroesophageal reflux disease)   . Heart failure ([place])   . Heart valve problem   . History of Bicuspid Aortic Valve    a. 08/2010 s/p AVR @ time of Ao dissection repair; b. 01/2016 Echo: Ef 55-65%, nl AoV bioprosthesis, nl RV, nl PASP.  Marland Kitchen Hx of repair of dissecting thoracic aortic aneurysm, [place] type A    a. 08/2010 s/p repair with AVR and VG->RCA; b. 01/2016 CTA: stable appearance of Asc Thoracic Aortic graft. Opacification of flase lumen of chronic  abd Ao dissection w/ retrograde flow through lumbar arteries. No aneurysmal dil of flase lumen.  . Hyperlipidemia   . Hypertensive heart disease   . IBS (irritable bowel syndrome)   . Internal hemorrhoids   . Kidney problem   . Marfan syndrome    pt denies  . Migraine   . Obstructive sleep apnea   . Osteoarthritis   . Osteoporosis   . Palpitations   . Pneumonia   . RA (rheumatoid arthritis) ([place])    a. Followed by Dr. [name]    Past Surgical History:  Procedure Laterality Date  . ABDOMINAL AORTIC ANEURYSM REPAIR    . CARDIAC CATHETERIZATION  2001  . [place]  . CHOLECYSTECTOMY    . COLONOSCOPY    . ESOPHAGOGASTRODUODENOSCOPY (EGD) WITH PROPOFOL N/A 05/08/2020   Procedure: ESOPHAGOGASTRODUODENOSCOPY (EGD) WITH PROPOFOL;  Surgeon: Virgel Manifold, MD;  Location: [facility] ENDOSCOPY;  Service: Endoscopy;  Laterality: N/A;  . FRACTURE SURGERY Right    shoulder replacement  . HEMORRHOID BANDING    . ORIF DISTAL RADIUS FRACTURE Left   . UPPER GASTROINTESTINAL ENDOSCOPY      There were no vitals filed for this visit.   Subjective Assessment - 04/17/21 0823    Subjective Pt reports at rest pain is  0/10 in R shoulder. She says,"I've had to do a lot, and will for a while," regarding recent activity/use of R shoulder Pt says when she uses L UE pain increases to 3/10.    Pertinent History Pt is a 65 y/o female presenting to therapy due to R shoulder pain. Pt had R shoulder replacement 12-13 years ago.  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 She says shoulder pain started this past January as she was helping lift her husband and his assistive devices. She reports she has since stopped lifting him and his walker as they have caregivers who come to the home for 16 hours/week and can help. Pt is most concerned that her shoulder hurts when she drives and washes her hair. Her shoulder does not hurt constantly, but she states she  has pain "the more I use it." She feels a catch and stabbing sensation in R anterior shoulder that can travel into bicep and that sometimes the pain is a burning sensation. She sometimes has pain in L shoulder.  She rates her worst R shoulder pain as 10/10, best pain is 0/10. Her R shoulder pain is currently 1.5/10. She reports that rest, ice and naproxen help with pain relief. She denies redness/swelling/temp difference and N/T in UEs.  Patient has been seen by other therapists in this clinic previously for cervicalgia and back related injuries/pain. Pt PMH includes COVID19, upper GI stomach issues, aorta type A dissection (2011) with aortic valve replacement, bypass of R coronary artery, HTN, hyperlipidemia, pre-diabetes, AFIB, CHF, depression, anxiety, OA, GERD, atypical migraine, asthma, and allergic rhinitis.    Limitations Lifting;House hold activities;Other (comment)   driving   How long can you sit comfortably? Not affected    How long can you stand comfortably? Not affected    How long can you walk comfortably? Pt reports she has pain only if she is carrying something    Diagnostic tests DG R shoulder 02/09/2021 per chart "Impression: Possible hardware loosening post total shoulder arthroplasty. No  evidence of acute fracture or dislocation"    Patient Stated Goals Be able to drive without pain    Currently in Pain? No/denies    Pain Onset More than a month ago          TREATMENT -   Therex:  Exercises  Ice to R shoulder x30 minutes while pt performed exercises. Pt skin checked frequently throughout session and once ice pack removed. Pt with no adverse reaction to treatment.Skin appears WNL once ice pack removed.  Shoulder extension with resistance at cable machine - 2x12, 1x10 at 7.5#. VC/TC/demo for technique  Standing cable machine rows - 12.5# for 2x10, 1x6 BUEs. No pain with exercise. TC for technique.  Standing Shoulder Diagonal Horizontal Abduction with RTB - 1x15, 2x10.  Demo/VC/TC for technique  Supine Pec Major Stretch 2x30 sec. First 30 sec without weight, second set with 1# dumbbells each hand. Pt reports feels good without weight, but with 1# dumbbell reported slight increase in R shoulder irritation.   Attempted forearm supported plank, but pt reported discomfort in R shoulder so discontinued.  Pt supine R UE perturbations - 4x30 sec bouts. VC for technique. No pain with exercise.  Attempted RTB shoulder ER, however pt reports continued R shoulder discomfort so discontinued.    Manual: Pt seated, withSTM to R upper trap and R cervicalparaspinals x approx16 min. Pt also performed R upper trap and L levator scap stretch  for approx 2 min each with STM. Pt continues to exhibit a couple trigger points in R upper trap, R cervical paraspinal near C3, and in ant. delt/pec. Pt reports improvement in sx with STM. No adverse reaction to tx.  Education provided throughout in the form of demo, VC to facilitate movement at target joints and correct muscle activation with exercises.Pt demonstrates good carryover within session with cues.    PT Education - 04/17/21 0928    Education Details Pt educated on exercise technique, body mechanics with shoulder diagonals    Person(s) Educated Patient    Methods Explanation;Demonstration;Verbal cues;Tactile cues    Comprehension Verbalized understanding;Returned demonstration            PT Short Term Goals - 02/24/21 1900      PT SHORT TERM GOAL #1   Title Patient will be independent in home exercise program to improve strength/mobility for better functional independence with ADLs.    Baseline 02/24/2021 Pt issued HEP    Time 4    Period Weeks    Status New    Target Date 03/24/21             PT Long Term Goals - 02/24/21 1842      PT LONG TERM GOAL #1   Title Patient will report a worst pain of 3/10  in last 7 days in her R shoulder to improve tolerance with ADLs and reduced symptoms with  activities.    Baseline 02/24/2021: Pt reports worst pain as 10/10.    Time 8    Period Weeks    Status New    Target Date 04/21/21      PT LONG TERM GOAL #2   Title Patient will decrease Quick DASH scores by > 8 points demonstrating reduced self-reported upper extremity disability.    Baseline 02/24/2021: QuickDash Disability/symptom score 40, Work score 37.5    Time 8    Period Weeks    Status New    Target Date 04/21/21      PT LONG TERM GOAL #3   Title Patient will increase FOTO score to equal to or greater than 60 to demonstrate statistically significant improvement in mobility and quality of life.    Baseline 02/24/2021: 55    Time 8    Period Weeks    Status New    Target Date 04/21/21      PT LONG TERM GOAL #4   Title Patient will increase BUE MMT scores by at least 1/2 point as to improve functional strength and endurance for overhead activities and ADLs such as driving and washing her hair.    Baseline 02/24/2021: L/R shoulder flexion 4/5, 3+/5; shoulder abduction 4/5 B; shoulder extension 4+5 B; shoulder IR/ER both grossly 4/5 B; elbow flexion/extension 5/5 for both B; wrist flexion/extension 5/5 for both B    Time 8    Period Weeks    Status New    Target Date 04/21/21                 Plan - 04/17/21 0937    Clinical Impression Statement Pt able to perform increased volume at 12.5# with standing rows today, indicating improved RUE strength. However, although pt exhibits progress, pt with slight increase in pain with attempts of forearm plank and RTB ER. Possibly affected by pt reported increased use of RUE over past couple days and also due to fatigue in R shoulder musculature after performing multiple therex in session. Pt sx improved with rest  and STM to R shoulder musculature.The pt will benefit from further skilled therapy to improve pain, UE strength and pain-free ROM to increase ease with ADLs in order to improve QOL.    Personal Factors and Comorbidities  Comorbidity 1;Comorbidity 2;Comorbidity 3+;Past/Current Experience;Social Background;Time since onset of injury/illness/exacerbation;Transportation;Fitness;Other    Comorbidities Pertinent: recent hx of COVID19, HTN, hyperlipidemia, pre-diabetes, AFIB, CHF, depression, anxiety, OA, atypical migraine, asthma.    Examination-Activity Limitations Bathing;Bed Mobility;Hygiene/Grooming;Lift;Caring for Others;Reach Overhead;Carry;Dressing;Sleep;Other    Examination-Participation Restrictions Laundry;Cleaning;Driving;Community Activity;Other;Yard Work;Shop;Meal Prep;Volunteer    Stability/Clinical Decision Making Stable/Uncomplicated    Rehab Potential Good    PT Frequency 2x / week    PT Duration 8 weeks    PT Treatment/Interventions ADLs/Self Care Home Management;Biofeedback;Cryotherapy;Electrical Stimulation;Moist Heat;Traction;Ultrasound;DME Instruction;Functional mobility training;Therapeutic activities;Therapeutic exercise;Neuromuscular re-education;Patient/family education;Orthotic Fit/Training;Manual techniques;Scar mobilization;Passive range of motion;Dry needling;Taping;Energy conservation;Joint Manipulations;Spinal Manipulations;Gait training    PT Next Visit Plan Progress shoulder strengthening exercises; cervical stretches within pain tolerance, shoulder stability exercises/closed kinetic chain, closed kinetic chain push-up plus, continue plan as previously indicated    PT Home Exercise Plan Issued HEP with handout: RTB rows, RTB shoulder ER pull-aparts, and upper trap stretch; shoulder perturbation exercises    Consulted and Agree with Plan of Care Patient           Patient will benefit from skilled therapeutic intervention in order to improve the following deficits and impairments:  Increased fascial restricitons,Improper body mechanics,Pain,Decreased mobility,Increased muscle spasms,Postural dysfunction,Decreased activity tolerance,Decreased endurance,Decreased range of motion,Decreased  strength,Hypomobility,Impaired UE functional use,Impaired flexibility,Decreased coordination  Visit Diagnosis: Chronic right shoulder pain  Muscle weakness (generalized)     Problem List Patient Active Problem List   Diagnosis Date Noted  . Abdominal pain, right lateral 10/21/2020  . Acute bronchitis with bronchospasm 05/28/2020  . Diarrhea 04/11/2020  . Epigastric pain 02/19/2020  . Dark stools 02/19/2020  . PAF (paroxysmal atrial fibrillation) ([place]) 02/05/2019  . Shortness of breath 01/27/2019  . Irregular surface of cornea 12/25/2018  . HPV (human papilloma virus) infection 12/01/2016  . Screening mammogram, encounter for 11/29/2016  . Estrogen deficiency 11/29/2016  . Encounter for routine gynecological examination 11/29/2016  . Grade III hemorrhoids 10/06/2016  . Tachycardia 08/14/2016  . Hemorrhoids 08/13/2016  . Atypical migraine 08/03/2016  . CAD (coronary artery disease)   . AAA (abdominal aortic aneurysm) ([place])   . Chronic combined systolic (congestive) and diastolic (congestive) heart failure ([place])   . Prediabetes 02/11/2016  . Generalized anxiety disorder 12/08/2015  . Panic attacks 07/28/2015  . Sensorineural hearing loss 03/27/2013  . Vitamin D deficiency 07/03/2012  . H/O aortic dissection 05/12/2012  . Class 2 obesity due to excess calories with body mass index (BMI) of 35.0 to 35.9 in adult 11/21/2011  . Obesity (BMI 30.0-34.9) 11/21/2011  . Routine general medical examination at a health care facility 09/20/2011  . S/P AVR (aortic valve replacement) 01/07/2011  . CORONARY ARTERY BYPASS GRAFT, HX OF 01/07/2011  . Arthritis 12/20/2010  . Back pain 12/15/2010  . Dissection of thoracic aorta ([place]) 12/11/2010  . H/O dissecting abdominal aortic aneurysm repair 08/24/2010  . IRRITABLE BOWEL SYNDROME 09/09/2009  . Obstructive sleep apnea 08/04/2009  . External hemorrhoid 06/26/2009  . Osteoporosis 01/09/2009  . Hyperlipidemia 07/26/2007  . Depression with  anxiety 07/26/2007  . Essential hypertension 07/26/2007  . Allergic rhinitis 07/26/2007  . Asthma 07/26/2007  . GERD 07/26/2007  . Osteoarthritis 07/26/2007  . Ocular migraine 07/26/2007  . Mild intermittent asthma without complication 78/24/2353   Ricard Dillon PT, DPT 04/17/2021, 9:42  AM  [place] MAIN [facility] SERVICES [address] [place], [place], [address] Phone: [phone]   Fax:  [phone]  Name: DYANNE PARRIS MRN: [id number] Date of Birth: 06-Aug-1956

## 2021-04-21 ENCOUNTER — Ambulatory Visit: Payer: Medicare HMO

## 2021-04-21 ENCOUNTER — Telehealth: Payer: Self-pay

## 2021-04-21 NOTE — Chronic Care Management (AMB) (Addendum)
Chronic Care Management Pharmacy Assistant   Name: Rachel Torres  MRN: [id number] DOB: 03-Jan-1956   Reason for Encounter: Disease State  HTN, HLD   Conditions to be addressed/monitored: HTN and HLD  Last CCM with pharmacist 09/22/20, no show April 2022   Recent office visits: 10/21/20 - [place] - Diarrhea, fluids and watch for improvement, labs and urinalysis   Recent consult visits: 04/17/2021  [facility] for right shoulder - No medicine changes 03/31/2021  Rachel Torres, Weight Management - No medicine changes 03/10/21 - Sports medicine, chronic right shoulder pain  02/26/21 - Rachel Torres, Weight Management - Rachel Torres notes Ozempic is helping her portion control. She denies nausea, vomiting, or diarrhea.Rachel Torres is on Lipitor, and she is tolerating it well. She is due for labs. Rachel Torres is on Vit D OTC, and her last level was at goal. 02/09/21 - Sports medicine, chronic right shoulder pain - start Voltaren gel 4x daily PRN pain, Xray, referral for PT 02/02/21 - Rachel Torres, Weight Management - Referral to sports medicine for shoulder pain, constipation - trial miralax OTC 17 gm daily, Start Nystatin  12/30/20 - Rachel Torres, Weight Management - Start Ozempic 0.25 mg weekly  11/25/20 - Rachel Torres, Weight Management 10/29/20- Rachel Torres, Weight Management 10/28/20 Dr. [name] changed Atorvastatin from daily to [facility], Wednesday and Friday.   Hospital visits:  None in previous 6 months  Medications: Outpatient Encounter Medications as of 04/21/2021  Medication Sig   albuterol (PROVENTIL HFA;VENTOLIN HFA) 108 (90 Base) MCG/ACT inhaler Inhale 1-2 puffs into the lungs every 6 (six) hours as needed for wheezing or shortness of breath.   ALPRAZolam (XANAX) 0.5 MG tablet Take 1 tablet (0.5 mg total) by mouth daily as needed for anxiety.   aspirin EC 81 MG tablet Take 81 mg by mouth at bedtime.   atorvastatin (LIPITOR) 20 MG tablet TAKE 1 TABLET EVERY DAY   carvedilol (COREG)  6.25 MG tablet TAKE 1 TABLET TWICE DAILY WITH A MEAL   Cholecalciferol (VITAMIN D3) 5000 units CAPS Take 1 capsule by mouth daily.   cyclobenzaprine (FLEXERIL) 10 MG tablet Take 0.5-1 tablets (5-10 mg total) by mouth 3 (three) times daily as needed. For headache   furosemide (LASIX) 20 MG tablet Take 1 tablet (20 mg total) by mouth daily. Take 1 tablet (20 mg) daily, may take additional 1 tablet (20 mg) in the afternoon as needed for swelling, shortness of breath, or weight gain   Krill Oil (OMEGA-3) 500 MG CAPS Take 1 capsule by mouth daily.   lisinopril (ZESTRIL) 5 MG tablet TAKE 1/2 TABLET EVERY DAY   magnesium gluconate (MAGONATE) 500 MG tablet Take 500 mg by mouth at bedtime.   Misc Natural Products (TART CHERRY ADVANCED) CAPS Take 1 capsule by mouth daily.   naproxen sodium (ANAPROX) 220 MG tablet Take 440 mg by mouth 2 (two) times daily as needed (for headaches/pain).   nystatin (MYCOSTATIN/NYSTOP) powder Apply 1 application topically 2 (two) times daily. Apply to affected area twice a day for 14 days   omeprazole (PRILOSEC) 40 MG capsule Take 1 capsule (40 mg total) by mouth daily.   potassium chloride (KLOR-CON) 10 MEQ tablet Take 1 tablet (10 mEq total) by mouth 2 (two) times daily as needed. Take additional pill when increasing Furosemide.   promethazine (PHENERGAN) 25 MG tablet Take 0.5 tablets (12.5 mg total) by mouth every 8 (eight) hours as needed for nausea.   Semaglutide,0.25 or 0.5MG /DOS, (OZEMPIC, 0.25 OR 0.5 MG/DOSE,) 2 MG/1.5ML  SOPN Inject 0.25 mg into the skin once a week.   Simethicone (MYLANTA GAS PO) Take by mouth 3 times/day as needed-between meals & bedtime.   venlafaxine XR (EFFEXOR-XR) 150 MG 24 hr capsule TAKE 1 CAPSULE EVERY DAY WITH BREAKFAST   No facility-administered encounter medications on file as of 04/21/2021.    Recent Office Vitals: BP Readings from Last 3 Encounters:  03/31/21 106/60  03/10/21 110/74  02/26/21 123/72   Pulse Readings from Last 3  Encounters:  03/31/21 94  03/10/21 92  02/26/21 69    Wt Readings from Last 3 Encounters:  03/31/21 182 lb (82.6 kg)  03/10/21 188 lb 12.8 oz (85.6 kg)  02/26/21 185 lb (83.9 kg)     Kidney Function Lab Results  Component Value Date/Time   CREATININE 0.91 02/26/2021 01:55 PM   CREATININE 0.82 10/21/2020 08:56 AM   CREATININE 0.94 06/20/2013 08:15 AM   CREATININE 0.96 05/12/2012 12:12 PM   CREATININE 0.88 04/09/2011 09:00 AM   GFR 76.34 10/21/2020 08:56 AM   GFRNONAA 68 07/23/2020 03:33 PM   GFRNONAA >60 06/20/2013 08:15 AM   GFRAA 78 07/23/2020 03:33 PM   GFRAA >60 06/20/2013 08:15 AM    BMP Latest Ref Rng & Units 02/26/2021 10/21/2020 07/23/2020  Glucose 65 - 99 mg/dL 97 98 101(H)  BUN 8 - 27 mg/dL 16 13 18   Creatinine 0.57 - 1.00 mg/dL 0.91 0.82 0.91  BUN/Creat Ratio 12 - 28 18 - 20  Sodium 134 - 144 mmol/L 137 136 139  Potassium 3.5 - 5.2 mmol/L 4.7 4.3 4.6  Chloride 96 - 106 mmol/L 100 100 100  CO2 20 - 29 mmol/L 21 29 25   Calcium 8.7 - 10.3 mg/dL 9.5 9.2 10.3   Hypertension  Current antihypertensive regimen:  Carvedilol 6.25 mg - 1 tablet twice daily with meal Lisinopril 5 mg - 1/2 tablet daily    Patient verbally confirms she is taking the above medications as directed. Yes  How often are you checking your Blood Pressure? when feeling symptomatic  Current home BP readings: none available Wrist or arm cuff:  The patient states she has arm cuff Caffeine intake:  Drinks decaf coffee, no soda intake Salt intake: limits at the table, will use with cooking OTC medications including pseudoephedrine or NSAIDs? no  Any readings above 180/120? No  What recent interventions/DTPs have been made by any provider to improve Blood Pressure control since last CPP Visit:  None identified   Any recent hospitalizations or ED visits since last visit with CPP? No  What diet changes have been made to improve Blood Pressure Control? The patient has lost 40 lbs over the past  year  What exercise is being done to improve your Blood Pressure Control?    The patient states she is not exercising currently.  Adherence Review: Is the patient currently on ACE/ARB medication? No Does the patient have >5 day gap between last estimated fill dates? Yes, Atorvastatin   Star Rating Drugs:  Medication:  Last Fill: Day Supply Atorvastatin 20mg . 01/08/2021 90ds Ozempic 0.25 or 0.5mg /dos 03/05/2021  56ds  Patient agrees to schedule a follow up appointment with Debbora Dus, CPP for 06/08/2021 at 10:30 am for a telephone visit.  Follow-Up:  Pharmacist Review  Cholesterol     Component Value Date/Time   CHOL 219 (H) 02/26/2021 1355   TRIG 77 02/26/2021 1355   TRIG 110 12/29/2006 1251   HDL 64 02/26/2021 1355   LDLCALC 141 (H) 02/26/2021 1355  LDLDIRECT 189.4 10/13/2011 1054    10-year ASCVD risk score: The 10-year ASCVD risk score Mikey Bussing DC Brooke Bonito., et al., 2013) is: 7.8%   Values used to calculate the score:     Age: 21 years     Sex: Female     Is Non-Hispanic African American: No     Diabetic: Yes     Tobacco smoker: No     Systolic Blood Pressure: 992 mmHg     Is BP treated: Yes     HDL Cholesterol: 64 mg/dL     Total Cholesterol: 219 mg/dL  Current antihyperlipidemic regimen:  aspirin EC 81 MG tablet - 1 tablet daily Krill Oil (OMEGA-3) 500 MG CAPS - 1 capsule daily atorvastatin (LIPITOR) 20 MG tablet - 1 tablet daily (Pt taking differently: 1 tablet on Monday, Wed, Friday only)  Previous antihyperlipidemic medications tried: atorvastatin daily, muscle cramps.  ASCVD risk enhancing conditions: HTN  What recent interventions/DTPs have been made by any provider to improve Cholesterol control since last CPP Visit:   The patient will work on lifestyle modifications.   Any recent hospitalizations or ED visits since last visit with CPP? No  What diet changes have been made to improve Cholesterol?  The patient states she chooses healthy options both at home  and with eating out with meals.  What exercise is being done to improve Cholesterol?  None identified.  Adherence Review: Does the patient have >5 day gap between last estimated fill dates? Yes - statin   Debbora Dus, CPP notified  Avel Sensor, [place] Assistant [phone]  I have reviewed the care management and care coordination activities outlined in this encounter and I am certifying that I agree with the content of this note. Cardiology, Dr. [name], reduced statin to three days weekly 11/21. Will need to verify that rx has been updated at pharmacy. Will see if patient needs refill and if so request updated rx.   Debbora Dus, PharmD Clinical Pharmacist [facility] [phone]

## 2021-04-22 ENCOUNTER — Ambulatory Visit: Payer: Medicare HMO | Admitting: Psychology

## 2021-04-24 ENCOUNTER — Ambulatory Visit: Payer: Medicare HMO

## 2021-04-25 ENCOUNTER — Other Ambulatory Visit: Payer: Self-pay | Admitting: Cardiovascular Disease

## 2021-04-27 NOTE — Telephone Encounter (Signed)
Rx request sent to pharmacy.  

## 2021-04-28 ENCOUNTER — Ambulatory Visit (INDEPENDENT_AMBULATORY_CARE_PROVIDER_SITE_OTHER): Payer: Medicare HMO | Admitting: Psychology

## 2021-04-28 ENCOUNTER — Ambulatory Visit: Payer: Medicare HMO

## 2021-04-28 DIAGNOSIS — F411 Generalized anxiety disorder: Secondary | ICD-10-CM | POA: Diagnosis not present

## 2021-04-28 DIAGNOSIS — F331 Major depressive disorder, recurrent, moderate: Secondary | ICD-10-CM

## 2021-04-29 ENCOUNTER — Other Ambulatory Visit (INDEPENDENT_AMBULATORY_CARE_PROVIDER_SITE_OTHER): Payer: Self-pay | Admitting: Family Medicine

## 2021-04-29 ENCOUNTER — Telehealth (INDEPENDENT_AMBULATORY_CARE_PROVIDER_SITE_OTHER): Payer: Self-pay | Admitting: Family Medicine

## 2021-04-29 DIAGNOSIS — R7303 Prediabetes: Secondary | ICD-10-CM

## 2021-04-29 NOTE — Telephone Encounter (Signed)
DR Beasley 

## 2021-04-29 NOTE — Telephone Encounter (Signed)
Patient is requesting a refill of the following medications: Requested Prescriptions   Pending Prescriptions Disp Refills   OZEMPIC, 0.25 OR 0.5 MG/DOSE, 2 MG/1.5ML SOPN [Pharmacy Med Name: OZEMPIC 0.25 OR 0.5MG /DOS 1X2MG  PEN] 1.5 mL     Sig: Inject 0.25 mg into the skin once a week.     Last office visit: 03/31/21 Date of last refill: 03/31/21 Last refill amount: 1 ml Follow up time period per chart: 6 weeks Next scheduled appt: 05/20/21

## 2021-04-29 NOTE — Telephone Encounter (Signed)
Pt last seen by Dr. Beasley.  

## 2021-04-29 NOTE — Telephone Encounter (Signed)
Rf x 1

## 2021-04-29 NOTE — Telephone Encounter (Signed)
Need BD needles for her Ozempic, she is out of needles [facility] in [place]

## 2021-04-30 ENCOUNTER — Other Ambulatory Visit (INDEPENDENT_AMBULATORY_CARE_PROVIDER_SITE_OTHER): Payer: Self-pay | Admitting: Emergency Medicine

## 2021-04-30 DIAGNOSIS — R7303 Prediabetes: Secondary | ICD-10-CM

## 2021-04-30 MED ORDER — BD PEN NEEDLE NANO U/F 32G X 4 MM MISC: 0 refills | Status: DC

## 2021-04-30 NOTE — Telephone Encounter (Signed)
Nano needles has been sent to the pharmacy

## 2021-05-01 ENCOUNTER — Ambulatory Visit: Payer: Medicare HMO

## 2021-05-05 ENCOUNTER — Ambulatory Visit: Payer: Medicare HMO

## 2021-05-06 ENCOUNTER — Ambulatory Visit: Payer: Medicare HMO | Admitting: Psychology

## 2021-05-06 ENCOUNTER — Ambulatory Visit: Payer: Medicare HMO | Attending: Family Medicine

## 2021-05-06 ENCOUNTER — Other Ambulatory Visit: Payer: Self-pay

## 2021-05-06 DIAGNOSIS — R278 Other lack of coordination: Secondary | ICD-10-CM | POA: Diagnosis not present

## 2021-05-06 DIAGNOSIS — M25511 Pain in right shoulder: Secondary | ICD-10-CM | POA: Diagnosis not present

## 2021-05-06 DIAGNOSIS — R2689 Other abnormalities of gait and mobility: Secondary | ICD-10-CM | POA: Diagnosis not present

## 2021-05-06 DIAGNOSIS — G8929 Other chronic pain: Secondary | ICD-10-CM | POA: Diagnosis not present

## 2021-05-06 DIAGNOSIS — M6281 Muscle weakness (generalized): Secondary | ICD-10-CM | POA: Insufficient documentation

## 2021-05-06 NOTE — Therapy (Signed)
[place] MAIN [facility] SERVICES [address] [place], [place], [address] Phone: [phone]   Fax:  [phone]  Physical Therapy Treatment/RECERT  Patient Details  Name: Rachel Torres MRN: [id number] Date of Birth: [birth year] Referring Provider (PT): Gregor Hams, MD   Encounter Date: 05/06/2021   PT End of Session - 05/07/21 0956    Visit Number 9    Number of Visits 41    Date for PT Re-Evaluation 07/29/21    Authorization Type Eval performed 08/26/63 (recert for 07/21/63); new recert for 12/21/62    PT Start Time 1602    PT Stop Time 1647    PT Time Calculation (min) 45 min    Activity Tolerance Patient tolerated treatment well    Behavior During Therapy [facility] for tasks assessed/performed           Past Medical History:  Diagnosis Date  . AAA (abdominal aortic aneurysm) ([place])    a. Chronic w/o evidence of aneurysmal dil on CTA 01/2016.  Marland Kitchen Alcohol abuse   . Allergy   . Anemia   . Anxiety   . CAD (coronary artery disease)    a. 08/2010 s/p CABG x 1 (VG->RCA) @ time of Ao dissection repair; b. 01/2016 Lexiscan MV: mid antsept/apical defect w/ ? peri-infarct ischemia-->likely attenuation-->Med Rx.  . Chewing difficulty   . Chronic combined systolic (congestive) and diastolic (congestive) heart failure ([place])    a. 2012 EF 30-35%; b. 04/2015 EF 40-45%; c. 01/2016 Echo: Ef 55-65%, nl AoV bioprosthesis, nl RV, nl PASP.  Marland Kitchen Constipation   . Depression   . Edema of both lower extremities   . Gallbladder sludge   . GERD (gastroesophageal reflux disease)   . Heart failure ([place])   . Heart valve problem   . History of Bicuspid Aortic Valve    a. 08/2010 s/p AVR @ time of Ao dissection repair; b. 01/2016 Echo: Ef 55-65%, nl AoV bioprosthesis, nl RV, nl PASP.  Marland Kitchen Hx of repair of dissecting thoracic aortic aneurysm, [place] type A    a. 08/2010 s/p repair with AVR and VG->RCA; b. 01/2016 CTA: stable appearance of Asc Thoracic Aortic graft.  Opacification of flase lumen of chronic abd Ao dissection w/ retrograde flow through lumbar arteries. No aneurysmal dil of flase lumen.  . Hyperlipidemia   . Hypertensive heart disease   . IBS (irritable bowel syndrome)   . Internal hemorrhoids   . Kidney problem   . Marfan syndrome    pt denies  . Migraine   . Obstructive sleep apnea   . Osteoarthritis   . Osteoporosis   . Palpitations   . Pneumonia   . RA (rheumatoid arthritis) ([place])    a. Followed by Dr. [name]    Past Surgical History:  Procedure Laterality Date  . ABDOMINAL AORTIC ANEURYSM REPAIR    . CARDIAC CATHETERIZATION  2001  . [place]  . CHOLECYSTECTOMY    . COLONOSCOPY    . ESOPHAGOGASTRODUODENOSCOPY (EGD) WITH PROPOFOL N/A 05/08/2020   Procedure: ESOPHAGOGASTRODUODENOSCOPY (EGD) WITH PROPOFOL;  Surgeon: Virgel Manifold, MD;  Location: [facility] ENDOSCOPY;  Service: Endoscopy;  Laterality: N/A;  . FRACTURE SURGERY Right    shoulder replacement  . HEMORRHOID BANDING    . ORIF DISTAL RADIUS FRACTURE Left   . UPPER GASTROINTESTINAL ENDOSCOPY      There were no vitals filed for this visit.   Subjective Assessment - 05/07/21 0953    Subjective Pt reports  she feels use of her R shoulder is still limited by decreased strength and pain. She reports she has had to use her shoulder a lot recently to help lift her spouse as he recently had surgery. [name] husband's surgery went well, and pt also went to wedding since last PT session so she has not been able to consistently perform HEP.    Pertinent History Pt is a 65 y/o female presenting to therapy due to R shoulder pain. Pt had R shoulder replacement 12-13 years ago.  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 She says shoulder pain started this past January as she was helping lift her husband and his assistive devices. She reports she has since stopped lifting him and his walker as they have caregivers who come  to the home for 16 hours/week and can help. Pt is most concerned that her shoulder hurts when she drives and washes her hair. Her shoulder does not hurt constantly, but she states she has pain "the more I use it." She feels a catch and stabbing sensation in R anterior shoulder that can travel into bicep and that sometimes the pain is a burning sensation. She sometimes has pain in L shoulder.  She rates her worst R shoulder pain as 10/10, best pain is 0/10. Her R shoulder pain is currently 1.5/10. She reports that rest, ice and naproxen help with pain relief. She denies redness/swelling/temp difference and N/T in UEs.  Patient has been seen by other therapists in this clinic previously for cervicalgia and back related injuries/pain. Pt PMH includes COVID19, upper GI stomach issues, aorta type A dissection (2011) with aortic valve replacement, bypass of R coronary artery, HTN, hyperlipidemia, pre-diabetes, AFIB, CHF, depression, anxiety, OA, GERD, atypical migraine, asthma, and allergic rhinitis.    Limitations Lifting;House hold activities;Other (comment)   driving   How long can you sit comfortably? Not affected    How long can you stand comfortably? Not affected    How long can you walk comfortably? Pt reports she has pain only if she is carrying something    Diagnostic tests DG R shoulder 02/09/2021 per chart "Impression: Possible hardware loosening post total shoulder arthroplasty. No  evidence of acute fracture or dislocation"    Patient Stated Goals Be able to drive without pain    Currently in Pain? Yes    Pain Location Shoulder    Pain Orientation Right    Pain Onset More than a month ago          TREATMENT  RECERT, goals retested  Testing-  FOTO: 60  QuickDash Disability/Symptom score: 38.6   QuickDash Work: 43.75   VAS score in last 7 days of R shoulder: 4/10  MMT: L shoulder is grossly 4+/5.  R shoulder flexion 3+/5; R shoulder abduction 4-/5; R shoulder IR/ER both 4-/5 (B); R  elbow flexion 4/5, elbow ext. 5/5 B, shoulder ext. 5/5 B  Interventions-   Seated -   Rows with RTB - 3x10 AROM shoulder ER with elbows at side - 3x10; pt unable to tolerate resistance with exercise d/t pain  YTB wall-walks - x several minutes  Cane assisted shoulder IR - 2x10 each UE; limited ROM R compared to L   Seated upper trap stratch 2x30 sec B  Manual:  Pt seated with elasto-gel applied over R shoulder musculature (elasto-gel applied for 11 min), while PT provides STM to R and L RTC musculature, upper traps, cervical paraspinals,  and R ant. Delt, R bicep. Skin checked once elasto-gel removed, no adverse reaction to treatment observed or reported.   Education provided primarily in the form of VC/TC/Demo to facilitate correct movement at target joints and improved muscle activation with all exercises and testing performed today. Pt exhibits good carryover within session after cuing.  Assessment: Pt goals retested for recert. Pt overall making gains in PT, seeing reduction in worse pain levels in R shoulder over past week (partially meeting goal), and achieving FOTO goal. Pt with improved QuickDash symptoms score, but slightly worse QuickDash work score. Pt also with mixed progress on MMT, likely impacted by pt recent increased use of R UE for repetitive lifting to help her spouse who has just had surgery and who needs help with his mobility. The pt will continue to benefit from further skilled PT to improve RUE pain and BUE strength to increase ease with ADLs and QOL.     PT Education - 05/07/21 0955    Education Details Pt educated on reassessment findings, indications for PT, POC    Person(s) Educated Patient    Methods Explanation    Comprehension Verbalized understanding            PT Short Term Goals - 05/07/21 0959      PT SHORT TERM GOAL #1   Title Patient will be independent in home exercise program to improve strength/mobility for better functional independence  with ADLs.    Baseline 02/24/2021 Pt issued HEP; 5/19: HEP to be advanced, pt has been unable to perform HEP consistently d/t spouse surgery and son's wedding.    Time 6    Period Weeks    Status On-going    Target Date 06/17/21             PT Long Term Goals - 05/06/21 1607      PT LONG TERM GOAL #1   Title Patient will report a worst pain of 3/10  in last 7 days in her R shoulder to improve tolerance with ADLs and reduced symptoms with activities.    Baseline 02/24/2021: Pt reports worst pain as 10/10.; 5/18: 4/10 is worst pain    Time 12    Period Weeks    Status Partially Met    Target Date 07/29/21      PT LONG TERM GOAL #2   Title Patient will decrease Quick DASH scores by > 8 points demonstrating reduced self-reported upper extremity disability.    Baseline 02/24/2021: QuickDash Disability/symptom score 40, Work score 37.5; 5/18: Disability/symtpom 38.6, Work 43.75    Time 12    Period Weeks    Status On-going    Target Date 07/29/21      PT LONG TERM GOAL #3   Title Patient will increase FOTO score to equal to or greater than 60 to demonstrate statistically significant improvement in mobility and quality of life.    Baseline 02/24/2021: 55; 5/18: 60    Time 12    Period Weeks    Status Achieved      PT LONG TERM GOAL #4   Title Patient will increase BUE MMT scores by at least 1/2 point as to improve functional strength and endurance for overhead activities and ADLs such as driving and washing her hair.    Baseline 02/24/2021: L/R shoulder flexion 4/5, 3+/5; shoulder abduction 4/5 B; shoulder extension 4+5 B; shoulder IR/ER both grossly 4/5 B; elbow flexion/extension 5/5 for both B; wrist flexion/extension 5/5 for both B; 5/18: L  shoulder is grossly 4+/5.  R shoulder flexion 3+/5; R shoulder abduction 4-/5; R shoulder IR/ER both 4-/5 (B); R elbow flexion 4/5, elbow ext. 5/5 B, shoulder ext. 5/5 B    Time 12    Period Weeks    Status On-going    Target Date 07/29/21             Patient will benefit from skilled therapeutic intervention in order to improve the following deficits and impairments:  Increased fascial restricitons,Improper body mechanics,Pain,Decreased mobility,Increased muscle spasms,Postural dysfunction,Decreased activity tolerance,Decreased endurance,Decreased range of motion,Decreased strength,Hypomobility,Impaired UE functional use,Impaired flexibility,Decreased coordination  Visit Diagnosis: Muscle weakness (generalized)  Chronic right shoulder pain     Problem List Patient Active Problem List   Diagnosis Date Noted  . Abdominal pain, right lateral 10/21/2020  . Acute bronchitis with bronchospasm 05/28/2020  . Diarrhea 04/11/2020  . Epigastric pain 02/19/2020  . Dark stools 02/19/2020  . PAF (paroxysmal atrial fibrillation) ([place]) 02/05/2019  . Shortness of breath 01/27/2019  . Irregular surface of cornea 12/25/2018  . HPV (human papilloma virus) infection 12/01/2016  . Screening mammogram, encounter for 11/29/2016  . Estrogen deficiency 11/29/2016  . Encounter for routine gynecological examination 11/29/2016  . Grade III hemorrhoids 10/06/2016  . Tachycardia 08/14/2016  . Hemorrhoids 08/13/2016  . Atypical migraine 08/03/2016  . CAD (coronary artery disease)   . AAA (abdominal aortic aneurysm) ([place])   . Chronic combined systolic (congestive) and diastolic (congestive) heart failure ([place])   . Prediabetes 02/11/2016  . Generalized anxiety disorder 12/08/2015  . Panic attacks 07/28/2015  . Sensorineural hearing loss 03/27/2013  . Vitamin D deficiency 07/03/2012  . H/O aortic dissection 05/12/2012  . Class 2 obesity due to excess calories with body mass index (BMI) of 35.0 to 35.9 in adult 11/21/2011  . Obesity (BMI 30.0-34.9) 11/21/2011  . Routine general medical examination at a health care facility 09/20/2011  . S/P AVR (aortic valve replacement) 01/07/2011  . CORONARY ARTERY BYPASS GRAFT, HX OF 01/07/2011  . Arthritis  12/20/2010  . Back pain 12/15/2010  . Dissection of thoracic aorta ([place]) 12/11/2010  . H/O dissecting abdominal aortic aneurysm repair 08/24/2010  . IRRITABLE BOWEL SYNDROME 09/09/2009  . Obstructive sleep apnea 08/04/2009  . External hemorrhoid 06/26/2009  . Osteoporosis 01/09/2009  . Hyperlipidemia 07/26/2007  . Depression with anxiety 07/26/2007  . Essential hypertension 07/26/2007  . Allergic rhinitis 07/26/2007  . Asthma 07/26/2007  . GERD 07/26/2007  . Osteoarthritis 07/26/2007  . Ocular migraine 07/26/2007  . Mild intermittent asthma without complication 37/94/3276   Ricard Dillon PT, DPT 05/07/2021, 12:15 PM  [place] MAIN [facility] SERVICES [address] [place], [place], [address] Phone: [phone]   Fax:  [phone]  Name: SELETA HOVLAND MRN: [id number] Date of Birth: 07-30-1956

## 2021-05-08 ENCOUNTER — Other Ambulatory Visit: Payer: Self-pay

## 2021-05-08 ENCOUNTER — Ambulatory Visit: Payer: Medicare HMO

## 2021-05-08 DIAGNOSIS — R2689 Other abnormalities of gait and mobility: Secondary | ICD-10-CM | POA: Diagnosis not present

## 2021-05-08 DIAGNOSIS — G8929 Other chronic pain: Secondary | ICD-10-CM | POA: Diagnosis not present

## 2021-05-08 DIAGNOSIS — M6281 Muscle weakness (generalized): Secondary | ICD-10-CM

## 2021-05-08 DIAGNOSIS — M25511 Pain in right shoulder: Secondary | ICD-10-CM | POA: Diagnosis not present

## 2021-05-08 DIAGNOSIS — R278 Other lack of coordination: Secondary | ICD-10-CM | POA: Diagnosis not present

## 2021-05-08 NOTE — Therapy (Signed)
[place] MAIN [facility] SERVICES [address] [place], [place], [address] Phone: [phone]   Fax:  [phone]  Physical Therapy Treatment/Physical Therapy Progress Note   Dates of reporting period  02/24/2021  to   05/08/2021   Patient Details  Name: Rachel Torres MRN: [id number] Date of Birth: [birth year] Referring Provider (PT): Gregor Hams, MD   Encounter Date: 05/08/2021   PT End of Session - 05/08/21 0941    Visit Number 10    Number of Visits 41    Date for PT Re-Evaluation 07/29/21    Authorization Type Eval performed 01/23/5572 (recert for 01/21/253); new recert for 2/70/6237    PT Start Time 0849    PT Stop Time 0932    PT Time Calculation (min) 43 min    Activity Tolerance Patient tolerated treatment well;Patient limited by pain    Behavior During Therapy [facility] for tasks assessed/performed           Past Medical History:  Diagnosis Date  . AAA (abdominal aortic aneurysm) ([place])    a. Chronic w/o evidence of aneurysmal dil on CTA 01/2016.  Marland Kitchen Alcohol abuse   . Allergy   . Anemia   . Anxiety   . CAD (coronary artery disease)    a. 08/2010 s/p CABG x 1 (VG->RCA) @ time of Ao dissection repair; b. 01/2016 Lexiscan MV: mid antsept/apical defect w/ ? peri-infarct ischemia-->likely attenuation-->Med Rx.  . Chewing difficulty   . Chronic combined systolic (congestive) and diastolic (congestive) heart failure ([place])    a. 2012 EF 30-35%; b. 04/2015 EF 40-45%; c. 01/2016 Echo: Ef 55-65%, nl AoV bioprosthesis, nl RV, nl PASP.  Marland Kitchen Constipation   . Depression   . Edema of both lower extremities   . Gallbladder sludge   . GERD (gastroesophageal reflux disease)   . Heart failure ([place])   . Heart valve problem   . History of Bicuspid Aortic Valve    a. 08/2010 s/p AVR @ time of Ao dissection repair; b. 01/2016 Echo: Ef 55-65%, nl AoV bioprosthesis, nl RV, nl PASP.  Marland Kitchen Hx of repair of dissecting thoracic aortic aneurysm, [place] type A    a.  08/2010 s/p repair with AVR and VG->RCA; b. 01/2016 CTA: stable appearance of Asc Thoracic Aortic graft. Opacification of flase lumen of chronic abd Ao dissection w/ retrograde flow through lumbar arteries. No aneurysmal dil of flase lumen.  . Hyperlipidemia   . Hypertensive heart disease   . IBS (irritable bowel syndrome)   . Internal hemorrhoids   . Kidney problem   . Marfan syndrome    pt denies  . Migraine   . Obstructive sleep apnea   . Osteoarthritis   . Osteoporosis   . Palpitations   . Pneumonia   . RA (rheumatoid arthritis) ([place])    a. Followed by Dr. [name]    Past Surgical History:  Procedure Laterality Date  . ABDOMINAL AORTIC ANEURYSM REPAIR    . CARDIAC CATHETERIZATION  2001  . [place]  . CHOLECYSTECTOMY    . COLONOSCOPY    . ESOPHAGOGASTRODUODENOSCOPY (EGD) WITH PROPOFOL N/A 05/08/2020   Procedure: ESOPHAGOGASTRODUODENOSCOPY (EGD) WITH PROPOFOL;  Surgeon: Virgel Manifold, MD;  Location: [facility] ENDOSCOPY;  Service: Endoscopy;  Laterality: N/A;  . FRACTURE SURGERY Right    shoulder replacement  . HEMORRHOID BANDING    . ORIF DISTAL RADIUS FRACTURE Left   . UPPER GASTROINTESTINAL ENDOSCOPY      There  were no vitals filed for this visit.   Subjective Assessment - 05/08/21 0940    Subjective Pt reports performing HEP. R shoulder hurting currently.    Pertinent History Pt is a 65 y/o female presenting to therapy due to R shoulder pain. Pt had R shoulder replacement 12-13 years ago.  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 She says shoulder pain started this past January as she was helping lift her husband and his assistive devices. She reports she has since stopped lifting him and his walker as they have caregivers who come to the home for 16 hours/week and can help. Pt is most concerned that her shoulder hurts when she drives and washes her hair. Her shoulder does not hurt constantly, but she states  she has pain "the more I use it." She feels a catch and stabbing sensation in R anterior shoulder that can travel into bicep and that sometimes the pain is a burning sensation. She sometimes has pain in L shoulder.  She rates her worst R shoulder pain as 10/10, best pain is 0/10. Her R shoulder pain is currently 1.5/10. She reports that rest, ice and naproxen help with pain relief. She denies redness/swelling/temp difference and N/T in UEs.  Patient has been seen by other therapists in this clinic previously for cervicalgia and back related injuries/pain. Pt PMH includes COVID19, upper GI stomach issues, aorta type A dissection (2011) with aortic valve replacement, bypass of R coronary artery, HTN, hyperlipidemia, pre-diabetes, AFIB, CHF, depression, anxiety, OA, GERD, atypical migraine, asthma, and allergic rhinitis.    Limitations Lifting;House hold activities;Other (comment)   driving   How long can you sit comfortably? Not affected    How long can you stand comfortably? Not affected    How long can you walk comfortably? Pt reports she has pain only if she is carrying something    Diagnostic tests DG R shoulder 02/09/2021 per chart "Impression: Possible hardware loosening post total shoulder arthroplasty. No  evidence of acute fracture or dislocation"    Patient Stated Goals Be able to drive without pain    Currently in Pain? Yes    Pain Location Shoulder    Pain Orientation Right    Pain Onset More than a month ago           TREATMENT  Seated, UBE bike forward/backward x 4 min warm-up (unbilled)  Therex - VC/demo throughout with instruction to perform within pain-free range or no greater than 3/10 pain. Also provided cuing to stop before R UE musculature fatigues.   Pt seated with elasto-gel to R shoulder x 10 min. No adverse reaction observed (skin WNL) or reported to tx. Pt reports elasto-gel feels good.   Seated RTB rows - 3x10-12 reps per set. No pain Towel-assist shoulder IR - 1x10  B. Limited on R. No pain, but does feel stretch to ant. Capsule.  Seated shrugs - 1x10, progressed to use of YTB 1x12 (disconintued YTB d/t reports of pain), 1x6 with no resistance, pt reports no pain RTB L shoulder IR - 3x10 YTB R shoulder IR - 3x10 (initially attempted with RTB, but pt reported discomfort) Cane assisted shoulder IR - 2x10 BUEs.  Upper trap stretch - 1x30 sec B Chin tucks with manual resistance - 2x10 with 3 sec holds; pt reports exercise improves sx Wall walks with red ball, LUE assisting RUE, superior/inferior/laterally - 4x each. Pt reports onset of fatigue.  RUE Pendulums CW/CC -  10x each direction; pt reports feels good on shoulder RUE Pendulums CW/CC holding 1# dumbbell - 10x each direciton    Manual-   Pt supine with elasto-gel applied over R shoulder musculature (elasto-gel applied for duration of manual therapy treatment), while PT provides STM to R upper traps, R posterior RTC musculature, R cervical paraspinal, and during sub-occip release. Trigger points noted in cervical paraspinals.   With pt still in supine PT provides R shoulder anterior and posterior mobilizations grade I-II for multiple 30 second bouts. PT also provides R shoulder distraction in 10-60 deg. of abduction, which pt reported felt good.   Skin checked once elasto-gel removed, no adverse reaction to treatment observed or reported. Skin WNL.   Education provided primarily in the form of VC/TC/Demo to facilitate correct movement at target joints and improved muscle activation with all exercises performed today. Pt exhibits good carryover within session after cuing.   Patient's condition has the potential to improve in response to therapy. Maximum improvement is yet to be obtained. The anticipated improvement is attainable and reasonable in a generally predictable time.       PT Education - 05/08/21 0940    Education Details Pt educated one exercise technique, body mechanics    Person(s)  Educated Patient    Methods Explanation;Demonstration;Verbal cues    Comprehension Verbalized understanding;Returned demonstration            PT Short Term Goals - 05/08/21 0959      PT SHORT TERM GOAL #1   Title Patient will be independent in home exercise program to improve strength/mobility for better functional independence with ADLs.    Baseline 02/24/2021 Pt issued HEP; 5/19: HEP to be advanced, pt has been unable to perform HEP consistently d/t spouse surgery and son's wedding.    Time 6    Period Weeks    Status On-going    Target Date 06/17/21             PT Long Term Goals - 05/08/21 0959      PT LONG TERM GOAL #1   Title Patient will report a worst pain of 3/10  in last 7 days in her R shoulder to improve tolerance with ADLs and reduced symptoms with activities.    Baseline 02/24/2021: Pt reports worst pain as 10/10.; 5/18: 4/10 is worst pain    Time 12    Period Weeks    Status Partially Met    Target Date 07/29/21      PT LONG TERM GOAL #2   Title Patient will decrease Quick DASH scores by > 8 points demonstrating reduced self-reported upper extremity disability.    Baseline 02/24/2021: QuickDash Disability/symptom score 40, Work score 37.5; 5/18: Disability/symtpom 38.6, Work 43.75    Time 12    Period Weeks    Status On-going    Target Date 07/29/21      PT LONG TERM GOAL #3   Title Patient will increase FOTO score to equal to or greater than 60 to demonstrate statistically significant improvement in mobility and quality of life.    Baseline 02/24/2021: 55; 5/18: 60    Time 12    Period Weeks    Status Achieved      PT LONG TERM GOAL #4   Title Patient will increase BUE MMT scores by at least 1/2 point as to improve functional strength and endurance for overhead activities and ADLs such as driving and washing her hair.    Baseline 02/24/2021: L/R shoulder  flexion 4/5, 3+/5; shoulder abduction 4/5 B; shoulder extension 4+5 B; shoulder IR/ER both grossly 4/5 B;  elbow flexion/extension 5/5 for both B; wrist flexion/extension 5/5 for both B; 5/18: L shoulder is grossly 4+/5.  R shoulder flexion 3+/5; R shoulder abduction 4-/5; R shoulder IR/ER both 4-/5 (B); R elbow flexion 4/5, elbow ext. 5/5 B, shoulder ext. 5/5 B    Time 12    Period Weeks    Status On-going    Target Date 07/29/21                 Plan - 05/08/21 0941    Clinical Impression Statement Goals just retested for recert last appointment, please refer to previous note from 05/06/2021. Pt slightly pain limited today with exercises with R shoulder pain. First half of session pt performed UE ROM and strengthening exercises within pain-tolerance and fatigue-threshold. She continues to exhibit reduced R shoulder IR compared to L. Pt responds well after therex to manual therapy, particularly with STM to cervical paraspinals and R shoulder distraction below 90 deg abduction. The pt will benefit from further skilled PT to improve pain, UE ROM and strength to increase ease with ADLs and QOL.    Personal Factors and Comorbidities Comorbidity 1;Comorbidity 2;Comorbidity 3+;Past/Current Experience;Social Background;Time since onset of injury/illness/exacerbation;Transportation;Fitness;Other    Comorbidities Pertinent: recent hx of COVID19, HTN, hyperlipidemia, pre-diabetes, AFIB, CHF, depression, anxiety, OA, atypical migraine, asthma.    Examination-Activity Limitations Bathing;Bed Mobility;Hygiene/Grooming;Lift;Caring for Others;Reach Overhead;Carry;Dressing;Sleep;Other    Examination-Participation Restrictions Laundry;Cleaning;Driving;Community Activity;Other;Yard Work;Shop;Meal Prep;Volunteer    Stability/Clinical Decision Making Stable/Uncomplicated    Rehab Potential Good    PT Frequency 2x / week    PT Duration 12 weeks    PT Treatment/Interventions ADLs/Self Care Home Management;Biofeedback;Cryotherapy;Electrical Stimulation;Moist Heat;Traction;Ultrasound;DME Instruction;Functional mobility  training;Therapeutic activities;Therapeutic exercise;Neuromuscular re-education;Patient/family education;Orthotic Fit/Training;Manual techniques;Scar mobilization;Passive range of motion;Dry needling;Taping;Energy conservation;Joint Manipulations;Spinal Manipulations;Gait training    PT Next Visit Plan Progress shoulder strengthening exercises; cervical stretches within pain tolerance, shoulder stability exercises/closed kinetic chain, closed kinetic chain push-up plus, continue plan as previously indicated    PT Home Exercise Plan Issued HEP with handout: RTB rows, RTB shoulder ER pull-aparts, and upper trap stretch; shoulder perturbation exercises, ROM for shoulder IR within pain-free range, mobilizations/distraction to R shoulder    Consulted and Agree with Plan of Care Patient           Patient will benefit from skilled therapeutic intervention in order to improve the following deficits and impairments:  Increased fascial restricitons,Improper body mechanics,Pain,Decreased mobility,Increased muscle spasms,Postural dysfunction,Decreased activity tolerance,Decreased endurance,Decreased range of motion,Decreased strength,Hypomobility,Impaired UE functional use,Impaired flexibility,Decreased coordination  Visit Diagnosis: Muscle weakness (generalized)  Chronic right shoulder pain  Other lack of coordination     Problem List Patient Active Problem List   Diagnosis Date Noted  . Abdominal pain, right lateral 10/21/2020  . Acute bronchitis with bronchospasm 05/28/2020  . Diarrhea 04/11/2020  . Epigastric pain 02/19/2020  . Dark stools 02/19/2020  . PAF (paroxysmal atrial fibrillation) ([place]) 02/05/2019  . Shortness of breath 01/27/2019  . Irregular surface of cornea 12/25/2018  . HPV (human papilloma virus) infection 12/01/2016  . Screening mammogram, encounter for 11/29/2016  . Estrogen deficiency 11/29/2016  . Encounter for routine gynecological examination 11/29/2016  . Grade III  hemorrhoids 10/06/2016  . Tachycardia 08/14/2016  . Hemorrhoids 08/13/2016  . Atypical migraine 08/03/2016  . CAD (coronary artery disease)   . AAA (abdominal aortic aneurysm) ([place])   . Chronic combined systolic (congestive) and diastolic (congestive) heart failure ([place])   .  Prediabetes 02/11/2016  . Generalized anxiety disorder 12/08/2015  . Panic attacks 07/28/2015  . Sensorineural hearing loss 03/27/2013  . Vitamin D deficiency 07/03/2012  . H/O aortic dissection 05/12/2012  . Class 2 obesity due to excess calories with body mass index (BMI) of 35.0 to 35.9 in adult 11/21/2011  . Obesity (BMI 30.0-34.9) 11/21/2011  . Routine general medical examination at a health care facility 09/20/2011  . S/P AVR (aortic valve replacement) 01/07/2011  . CORONARY ARTERY BYPASS GRAFT, HX OF 01/07/2011  . Arthritis 12/20/2010  . Back pain 12/15/2010  . Dissection of thoracic aorta ([place]) 12/11/2010  . H/O dissecting abdominal aortic aneurysm repair 08/24/2010  . IRRITABLE BOWEL SYNDROME 09/09/2009  . Obstructive sleep apnea 08/04/2009  . External hemorrhoid 06/26/2009  . Osteoporosis 01/09/2009  . Hyperlipidemia 07/26/2007  . Depression with anxiety 07/26/2007  . Essential hypertension 07/26/2007  . Allergic rhinitis 07/26/2007  . Asthma 07/26/2007  . GERD 07/26/2007  . Osteoarthritis 07/26/2007  . Ocular migraine 07/26/2007  . Mild intermittent asthma without complication 55/97/4163   Ricard Dillon PT, DPT 05/08/2021, 9:59 AM  [place] MAIN [facility] SERVICES [address] [place], [place], [address] Phone: [phone]   Fax:  [phone]  Name: Rachel Torres MRN: [id number] Date of Birth: [birth year]

## 2021-05-11 ENCOUNTER — Ambulatory Visit (INDEPENDENT_AMBULATORY_CARE_PROVIDER_SITE_OTHER): Payer: TRICARE For Life (TFL) | Admitting: Family Medicine

## 2021-05-12 ENCOUNTER — Ambulatory Visit (INDEPENDENT_AMBULATORY_CARE_PROVIDER_SITE_OTHER): Payer: Medicare HMO | Admitting: Psychology

## 2021-05-12 ENCOUNTER — Ambulatory Visit: Payer: Medicare HMO

## 2021-05-12 DIAGNOSIS — F411 Generalized anxiety disorder: Secondary | ICD-10-CM

## 2021-05-12 DIAGNOSIS — F331 Major depressive disorder, recurrent, moderate: Secondary | ICD-10-CM

## 2021-05-13 ENCOUNTER — Ambulatory Visit: Payer: Medicare HMO

## 2021-05-13 ENCOUNTER — Other Ambulatory Visit: Payer: Self-pay

## 2021-05-13 DIAGNOSIS — M6281 Muscle weakness (generalized): Secondary | ICD-10-CM | POA: Diagnosis not present

## 2021-05-13 DIAGNOSIS — G8929 Other chronic pain: Secondary | ICD-10-CM

## 2021-05-13 DIAGNOSIS — R2689 Other abnormalities of gait and mobility: Secondary | ICD-10-CM

## 2021-05-13 DIAGNOSIS — M25511 Pain in right shoulder: Secondary | ICD-10-CM | POA: Diagnosis not present

## 2021-05-13 DIAGNOSIS — R278 Other lack of coordination: Secondary | ICD-10-CM | POA: Diagnosis not present

## 2021-05-13 NOTE — Progress Notes (Addendum)
Left message for patient to return call regarding her atorvastatin.  Debbora Dus, CPP notified  CPA Time: 5 mins Avel Sensor, [place] Assistant [phone]

## 2021-05-13 NOTE — Therapy (Signed)
[place] MAIN [name] SERVICES [address] [place], [place], [address] Phone: [phone]   Fax:  [phone]  Physical Therapy Treatment  Patient Details  Name: Rachel Torres MRN: [id number] Date of Birth: 19-Aug-1956 Referring Provider (PT): Gregor Hams, MD   Encounter Date: 05/13/2021   PT End of Session - 05/14/21 0823    Visit Number 11    Number of Visits 41    Date for PT Re-Evaluation 07/29/21    Authorization Type Eval performed 07/27/4165 (recert for 0/05/3015); new recert for 0/09/9322    PT Start Time 1604    PT Stop Time 1646    PT Time Calculation (min) 42 min    Activity Tolerance Patient tolerated treatment well    Behavior During Therapy [facility] for tasks assessed/performed           Past Medical History:  Diagnosis Date  . AAA (abdominal aortic aneurysm) ([place])    a. Chronic w/o evidence of aneurysmal dil on CTA 01/2016.  Marland Kitchen Alcohol abuse   . Allergy   . Anemia   . Anxiety   . CAD (coronary artery disease)    a. 08/2010 s/p CABG x 1 (VG->RCA) @ time of Ao dissection repair; b. 01/2016 Lexiscan MV: mid antsept/apical defect w/ ? peri-infarct ischemia-->likely attenuation-->Med Rx.  . Chewing difficulty   . Chronic combined systolic (congestive) and diastolic (congestive) heart failure ([place])    a. 2012 EF 30-35%; b. 04/2015 EF 40-45%; c. 01/2016 Echo: Ef 55-65%, nl AoV bioprosthesis, nl RV, nl PASP.  Marland Kitchen Constipation   . Depression   . Edema of both lower extremities   . Gallbladder sludge   . GERD (gastroesophageal reflux disease)   . Heart failure ([place])   . Heart valve problem   . History of Bicuspid Aortic Valve    a. 08/2010 s/p AVR @ time of Ao dissection repair; b. 01/2016 Echo: Ef 55-65%, nl AoV bioprosthesis, nl RV, nl PASP.  Marland Kitchen Hx of repair of dissecting thoracic aortic aneurysm, [place] type A    a. 08/2010 s/p repair with AVR and VG->RCA; b. 01/2016 CTA: stable appearance of Asc Thoracic Aortic graft.  Opacification of flase lumen of chronic abd Ao dissection w/ retrograde flow through lumbar arteries. No aneurysmal dil of flase lumen.  . Hyperlipidemia   . Hypertensive heart disease   . IBS (irritable bowel syndrome)   . Internal hemorrhoids   . Kidney problem   . Marfan syndrome    pt denies  . Migraine   . Obstructive sleep apnea   . Osteoarthritis   . Osteoporosis   . Palpitations   . Pneumonia   . RA (rheumatoid arthritis) ([place])    a. Followed by Dr. [name]    Past Surgical History:  Procedure Laterality Date  . ABDOMINAL AORTIC ANEURYSM REPAIR    . CARDIAC CATHETERIZATION  2001  . [place]  . CHOLECYSTECTOMY    . COLONOSCOPY    . ESOPHAGOGASTRODUODENOSCOPY (EGD) WITH PROPOFOL N/A 05/08/2020   Procedure: ESOPHAGOGASTRODUODENOSCOPY (EGD) WITH PROPOFOL;  Surgeon: Virgel Manifold, MD;  Location: [facility] ENDOSCOPY;  Service: Endoscopy;  Laterality: N/A;  . FRACTURE SURGERY Right    shoulder replacement  . HEMORRHOID BANDING    . ORIF DISTAL RADIUS FRACTURE Left   . UPPER GASTROINTESTINAL ENDOSCOPY      There were no vitals filed for this visit.   Subjective Assessment - 05/14/21 0821    Subjective Pt says  she has been having to lift her spouse a lot lately and will have to continue to do so. She says her RUE is sore.    Pertinent History Pt is a 65 y/o female presenting to therapy due to R shoulder pain. Pt had R shoulder replacement 12-13 years ago.  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 She says shoulder pain started this past January as she was helping lift her husband and his assistive devices. She reports she has since stopped lifting him and his walker as they have caregivers who come to the home for 16 hours/week and can help. Pt is most concerned that her shoulder hurts when she drives and washes her hair. Her shoulder does not hurt constantly, but she states she has pain "the more I use it." She  feels a catch and stabbing sensation in R anterior shoulder that can travel into bicep and that sometimes the pain is a burning sensation. She sometimes has pain in L shoulder.  She rates her worst R shoulder pain as 10/10, best pain is 0/10. Her R shoulder pain is currently 1.5/10. She reports that rest, ice and naproxen help with pain relief. She denies redness/swelling/temp difference and N/T in UEs.  Patient has been seen by other therapists in this clinic previously for cervicalgia and back related injuries/pain. Pt PMH includes COVID19, upper GI stomach issues, aorta type A dissection (2011) with aortic valve replacement, bypass of R coronary artery, HTN, hyperlipidemia, pre-diabetes, AFIB, CHF, depression, anxiety, OA, GERD, atypical migraine, asthma, and allergic rhinitis.    Limitations Lifting;House hold activities;Other (comment)   driving   How long can you sit comfortably? Not affected    How long can you stand comfortably? Not affected    How long can you walk comfortably? Pt reports she has pain only if she is carrying something    Diagnostic tests DG R shoulder 02/09/2021 per chart "Impression: Possible hardware loosening post total shoulder arthroplasty. No  evidence of acute fracture or dislocation"    Patient Stated Goals Be able to drive without pain    Currently in Pain? Yes    Pain Location Shoulder    Pain Orientation Right    Pain Onset More than a month ago         TREATMENT   Pt performed 10-12 reps for the following exercises within pain-free range.  Shoulder shrugs - 2 sets Shoulder retractions - 2 sets Pendulums - CC/CW - 1 set Cat-cow -  2 sets. Pt reports no pain with exercise, improvement from prior sessions where pt attempted increased weight-bearing, closed chain exercise. Bird-dog with UE reaches - 2 sets. Pt reports fatigue but no pain with exercise.  Matrix standing shoulder rows - 7.5# - 3 sets. Walk walks with RTB - 1 set, pt reports fatigue. Push-up  plus on wall - 2 sets. For second set pt just performed concentric component as she reported some increased pain in R bicep with eccentric component.  Towel-assisted R shoulder IR - 2 sets.  Lifting mechanics/deadlifts with 8# dumbbells - 3 sets  Manual-  Pt seated with elasto-gel applied over low back (as pt reports increase in low back discomfort since she has been lifting spouse), while PT provides STM to R upper trap, R posterior RTC musculature, R cervical paraspinal, and R bicep. Trigger points noted in cervical paraspinals and near R supraspinatus.   Elasto-gel removed, pt with no reports of adverse reaction,  and reported elasto-gel felt good to low back. Pt also reports improved sx with STM.    Education provided primarily in the form of VC/TC/Demo to facilitate correct movement at target joints and improved muscle activation with all exercises performed today. Pt exhibits good carryover within session after cuing.      PT Education - 05/14/21 0822    Education Details Body mechanics, technique with lifting mechanics to reduce strain on RUE    Person(s) Educated Patient    Methods Explanation;Demonstration;Verbal cues;Tactile cues    Comprehension Verbalized understanding;Returned demonstration            PT Short Term Goals - 05/08/21 0959      PT SHORT TERM GOAL #1   Title Patient will be independent in home exercise program to improve strength/mobility for better functional independence with ADLs.    Baseline 02/24/2021 Pt issued HEP; 5/19: HEP to be advanced, pt has been unable to perform HEP consistently d/t spouse surgery and son's wedding.    Time 6    Period Weeks    Status On-going    Target Date 06/17/21             PT Long Term Goals - 05/08/21 0959      PT LONG TERM GOAL #1   Title Patient will report a worst pain of 3/10  in last 7 days in her R shoulder to improve tolerance with ADLs and reduced symptoms with activities.    Baseline 02/24/2021: Pt reports  worst pain as 10/10.; 5/18: 4/10 is worst pain    Time 12    Period Weeks    Status Partially Met    Target Date 07/29/21      PT LONG TERM GOAL #2   Title Patient will decrease Quick DASH scores by > 8 points demonstrating reduced self-reported upper extremity disability.    Baseline 02/24/2021: QuickDash Disability/symptom score 40, Work score 37.5; 5/18: Disability/symtpom 38.6, Work 43.75    Time 12    Period Weeks    Status On-going    Target Date 07/29/21      PT LONG TERM GOAL #3   Title Patient will increase FOTO score to equal to or greater than 60 to demonstrate statistically significant improvement in mobility and quality of life.    Baseline 02/24/2021: 55; 5/18: 60    Time 12    Period Weeks    Status Achieved      PT LONG TERM GOAL #4   Title Patient will increase BUE MMT scores by at least 1/2 point as to improve functional strength and endurance for overhead activities and ADLs such as driving and washing her hair.    Baseline 02/24/2021: L/R shoulder flexion 4/5, 3+/5; shoulder abduction 4/5 B; shoulder extension 4+5 B; shoulder IR/ER both grossly 4/5 B; elbow flexion/extension 5/5 for both B; wrist flexion/extension 5/5 for both B; 5/18: L shoulder is grossly 4+/5.  R shoulder flexion 3+/5; R shoulder abduction 4-/5; R shoulder IR/ER both 4-/5 (B); R elbow flexion 4/5, elbow ext. 5/5 B, shoulder ext. 5/5 B    Time 12    Period Weeks    Status On-going    Target Date 07/29/21                 Plan - 05/14/21 0843    Clinical Impression Statement Pt shows progress with reporting no pain in RUE with weightbearing shoulder exercises. Part of session dedicated to assessing and teaching pt deadlift technique. Pt  exhibited increased use of RUE and R bicep when she demonstrated how she has been lifting her spouse for transfers, likely contributing to continued pain in area. PT provided multi-modal cuing for deadlift technique (hip hinge, pushing through LEs as primary force  generation)  and pt was able to perform correct technique following cuing, also reporting reduced sx of RUE with technique. The pt will benefit from further skilled therapy to improve pain, UE ROM and strength to increase QOL.    Personal Factors and Comorbidities Comorbidity 1;Comorbidity 2;Comorbidity 3+;Past/Current Experience;Social Background;Time since onset of injury/illness/exacerbation;Transportation;Fitness;Other    Comorbidities Pertinent: recent hx of COVID19, HTN, hyperlipidemia, pre-diabetes, AFIB, CHF, depression, anxiety, OA, atypical migraine, asthma.    Examination-Activity Limitations Bathing;Bed Mobility;Hygiene/Grooming;Lift;Caring for Others;Reach Overhead;Carry;Dressing;Sleep;Other    Examination-Participation Restrictions Laundry;Cleaning;Driving;Community Activity;Other;Yard Work;Shop;Meal Prep;Volunteer    Stability/Clinical Decision Making Stable/Uncomplicated    Rehab Potential Good    PT Frequency 2x / week    PT Duration 12 weeks    PT Treatment/Interventions ADLs/Self Care Home Management;Biofeedback;Cryotherapy;Electrical Stimulation;Moist Heat;Traction;Ultrasound;DME Instruction;Functional mobility training;Therapeutic activities;Therapeutic exercise;Neuromuscular re-education;Patient/family education;Orthotic Fit/Training;Manual techniques;Scar mobilization;Passive range of motion;Dry needling;Taping;Energy conservation;Joint Manipulations;Spinal Manipulations;Gait training    PT Next Visit Plan Progress shoulder strengthening exercises; cervical stretches within pain tolerance, shoulder stability exercises/closed kinetic chain, closed kinetic chain push-up plus, continue plan as previously indicated    PT Home Exercise Plan Issued HEP with handout: RTB rows, RTB shoulder ER pull-aparts, and upper trap stretch; shoulder perturbation exercises, ROM for shoulder IR within pain-free range, mobilizations/distraction to R shoulder, continue training correct lifting mechanics  in order to reduce strain on RUE    Consulted and Agree with Plan of Care Patient           Patient will benefit from skilled therapeutic intervention in order to improve the following deficits and impairments:  Increased fascial restricitons,Improper body mechanics,Pain,Decreased mobility,Increased muscle spasms,Postural dysfunction,Decreased activity tolerance,Decreased endurance,Decreased range of motion,Decreased strength,Hypomobility,Impaired UE functional use,Impaired flexibility,Decreased coordination  Visit Diagnosis: Chronic right shoulder pain  Muscle weakness (generalized)  Other abnormalities of gait and mobility     Problem List Patient Active Problem List   Diagnosis Date Noted  . Abdominal pain, right lateral 10/21/2020  . Acute bronchitis with bronchospasm 05/28/2020  . Diarrhea 04/11/2020  . Epigastric pain 02/19/2020  . Dark stools 02/19/2020  . PAF (paroxysmal atrial fibrillation) ([place]) 02/05/2019  . Shortness of breath 01/27/2019  . Irregular surface of cornea 12/25/2018  . HPV (human papilloma virus) infection 12/01/2016  . Screening mammogram, encounter for 11/29/2016  . Estrogen deficiency 11/29/2016  . Encounter for routine gynecological examination 11/29/2016  . Grade III hemorrhoids 10/06/2016  . Tachycardia 08/14/2016  . Hemorrhoids 08/13/2016  . Atypical migraine 08/03/2016  . CAD (coronary artery disease)   . AAA (abdominal aortic aneurysm) ([place])   . Chronic combined systolic (congestive) and diastolic (congestive) heart failure ([place])   . Prediabetes 02/11/2016  . Generalized anxiety disorder 12/08/2015  . Panic attacks 07/28/2015  . Sensorineural hearing loss 03/27/2013  . Vitamin D deficiency 07/03/2012  . H/O aortic dissection 05/12/2012  . Class 2 obesity due to excess calories with body mass index (BMI) of 35.0 to 35.9 in adult 11/21/2011  . Obesity (BMI 30.0-34.9) 11/21/2011  . Routine general medical examination at a health care  facility 09/20/2011  . S/P AVR (aortic valve replacement) 01/07/2011  . CORONARY ARTERY BYPASS GRAFT, HX OF 01/07/2011  . Arthritis 12/20/2010  . Back pain 12/15/2010  . Dissection of thoracic aorta ([place]) 12/11/2010  . H/O dissecting  abdominal aortic aneurysm repair 08/24/2010  . IRRITABLE BOWEL SYNDROME 09/09/2009  . Obstructive sleep apnea 08/04/2009  . External hemorrhoid 06/26/2009  . Osteoporosis 01/09/2009  . Hyperlipidemia 07/26/2007  . Depression with anxiety 07/26/2007  . Essential hypertension 07/26/2007  . Allergic rhinitis 07/26/2007  . Asthma 07/26/2007  . GERD 07/26/2007  . Osteoarthritis 07/26/2007  . Ocular migraine 07/26/2007  . Mild intermittent asthma without complication 35/39/1225   Ricard Dillon PT, DPT 05/14/2021, 8:48 AM  [place] MAIN [facility] SERVICES [address] [place], [place], [address] Phone: [phone]   Fax:  [phone]  Name: Rachel Torres MRN: [id number] Date of Birth: 07/23/1956

## 2021-05-15 ENCOUNTER — Ambulatory Visit: Payer: Medicare HMO

## 2021-05-20 ENCOUNTER — Ambulatory Visit (INDEPENDENT_AMBULATORY_CARE_PROVIDER_SITE_OTHER): Payer: Medicare HMO | Admitting: Family Medicine

## 2021-05-20 ENCOUNTER — Other Ambulatory Visit: Payer: Self-pay

## 2021-05-20 ENCOUNTER — Encounter (INDEPENDENT_AMBULATORY_CARE_PROVIDER_SITE_OTHER): Payer: Self-pay | Admitting: Family Medicine

## 2021-05-20 ENCOUNTER — Ambulatory Visit: Payer: Medicare HMO | Attending: Family Medicine

## 2021-05-20 ENCOUNTER — Ambulatory Visit: Payer: Medicare HMO | Admitting: Psychology

## 2021-05-20 ENCOUNTER — Other Ambulatory Visit: Payer: Self-pay | Admitting: Cardiovascular Disease

## 2021-05-20 VITALS — BP 113/71 | HR 79 | Temp 97.7°F | Ht 65.0 in | Wt 179.0 lb

## 2021-05-20 DIAGNOSIS — R2689 Other abnormalities of gait and mobility: Secondary | ICD-10-CM | POA: Diagnosis not present

## 2021-05-20 DIAGNOSIS — R293 Abnormal posture: Secondary | ICD-10-CM | POA: Insufficient documentation

## 2021-05-20 DIAGNOSIS — M25511 Pain in right shoulder: Secondary | ICD-10-CM | POA: Diagnosis not present

## 2021-05-20 DIAGNOSIS — M542 Cervicalgia: Secondary | ICD-10-CM | POA: Diagnosis not present

## 2021-05-20 DIAGNOSIS — Z6835 Body mass index (BMI) 35.0-35.9, adult: Secondary | ICD-10-CM | POA: Diagnosis not present

## 2021-05-20 DIAGNOSIS — E1169 Type 2 diabetes mellitus with other specified complication: Secondary | ICD-10-CM

## 2021-05-20 DIAGNOSIS — R7303 Prediabetes: Secondary | ICD-10-CM

## 2021-05-20 DIAGNOSIS — G8929 Other chronic pain: Secondary | ICD-10-CM | POA: Diagnosis not present

## 2021-05-20 DIAGNOSIS — M6281 Muscle weakness (generalized): Secondary | ICD-10-CM

## 2021-05-20 DIAGNOSIS — R278 Other lack of coordination: Secondary | ICD-10-CM | POA: Insufficient documentation

## 2021-05-20 MED ORDER — OZEMPIC (0.25 OR 0.5 MG/DOSE) 2 MG/1.5ML ~~LOC~~ SOPN
0.5000 mg | PEN_INJECTOR | SUBCUTANEOUS | 0 refills | Status: DC
Start: 2021-05-20 — End: 2021-06-23

## 2021-05-20 NOTE — Therapy (Signed)
[place] MAIN [facility] SERVICES [address] [place], [place], [address] Phone: [phone]   Fax:  [phone]  Physical Therapy Treatment  Patient Details  Name: Rachel Torres MRN: [id number] Date of Birth: 04/06/1956 Referring Provider (PT): Gregor Hams, MD   Encounter Date: 05/20/2021   PT End of Session - 05/20/21 1702    Visit Number 12    Number of Visits 41    Date for PT Re-Evaluation 07/29/21    Authorization Type Eval performed 06/27/3902 (recert for 0/0/9233); new recert for 0/06/6225    PT Start Time 1556    PT Stop Time 1645    PT Time Calculation (min) 49 min    Activity Tolerance Patient tolerated treatment well    Behavior During Therapy [facility] for tasks assessed/performed           Past Medical History:  Diagnosis Date  . AAA (abdominal aortic aneurysm) ([place])    a. Chronic w/o evidence of aneurysmal dil on CTA 01/2016.  Marland Kitchen Alcohol abuse   . Allergy   . Anemia   . Anxiety   . CAD (coronary artery disease)    a. 08/2010 s/p CABG x 1 (VG->RCA) @ time of Ao dissection repair; b. 01/2016 Lexiscan MV: mid antsept/apical defect w/ ? peri-infarct ischemia-->likely attenuation-->Med Rx.  . Chewing difficulty   . Chronic combined systolic (congestive) and diastolic (congestive) heart failure ([place])    a. 2012 EF 30-35%; b. 04/2015 EF 40-45%; c. 01/2016 Echo: Ef 55-65%, nl AoV bioprosthesis, nl RV, nl PASP.  Marland Kitchen Constipation   . Depression   . Edema of both lower extremities   . Gallbladder sludge   . GERD (gastroesophageal reflux disease)   . Heart failure ([place])   . Heart valve problem   . History of Bicuspid Aortic Valve    a. 08/2010 s/p AVR @ time of Ao dissection repair; b. 01/2016 Echo: Ef 55-65%, nl AoV bioprosthesis, nl RV, nl PASP.  Marland Kitchen Hx of repair of dissecting thoracic aortic aneurysm, [place] type A    a. 08/2010 s/p repair with AVR and VG->RCA; b. 01/2016 CTA: stable appearance of Asc Thoracic Aortic graft. Opacification  of flase lumen of chronic abd Ao dissection w/ retrograde flow through lumbar arteries. No aneurysmal dil of flase lumen.  . Hyperlipidemia   . Hypertensive heart disease   . IBS (irritable bowel syndrome)   . Internal hemorrhoids   . Kidney problem   . Marfan syndrome    pt denies  . Migraine   . Obstructive sleep apnea   . Osteoarthritis   . Osteoporosis   . Palpitations   . Pneumonia   . RA (rheumatoid arthritis) ([place])    a. Followed by Dr. [name]    Past Surgical History:  Procedure Laterality Date  . ABDOMINAL AORTIC ANEURYSM REPAIR    . CARDIAC CATHETERIZATION  2001  . [place]  . CHOLECYSTECTOMY    . COLONOSCOPY    . ESOPHAGOGASTRODUODENOSCOPY (EGD) WITH PROPOFOL N/A 05/08/2020   Procedure: ESOPHAGOGASTRODUODENOSCOPY (EGD) WITH PROPOFOL;  Surgeon: Virgel Manifold, MD;  Location: [facility] ENDOSCOPY;  Service: Endoscopy;  Laterality: N/A;  . FRACTURE SURGERY Right    shoulder replacement  . HEMORRHOID BANDING    . ORIF DISTAL RADIUS FRACTURE Left   . UPPER GASTROINTESTINAL ENDOSCOPY      There were no vitals filed for this visit.   Subjective Assessment - 05/20/21 1656    Subjective Pt reports  current pain 2/10. Pt reports only slight twinge with washing her hair yesterday. Pt reports she is still having some pain with driving. Pt reports has not done HEP recently. Pt she got second booster in R arm on Friday.    Pertinent History Pt is a 65 y/o female presenting to therapy due to R shoulder pain. Pt had R shoulder replacement 12-13 years ago.  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 She says shoulder pain started this past January as she was helping lift her husband and his assistive devices. She reports she has since stopped lifting him and his walker as they have caregivers who come to the home for 16 hours/week and can help. Pt is most concerned that her shoulder hurts when she drives and washes her  hair. Her shoulder does not hurt constantly, but she states she has pain "the more I use it." She feels a catch and stabbing sensation in R anterior shoulder that can travel into bicep and that sometimes the pain is a burning sensation. She sometimes has pain in L shoulder.  She rates her worst R shoulder pain as 10/10, best pain is 0/10. Her R shoulder pain is currently 1.5/10. She reports that rest, ice and naproxen help with pain relief. She denies redness/swelling/temp difference and N/T in UEs.  Patient has been seen by other therapists in this clinic previously for cervicalgia and back related injuries/pain. Pt PMH includes COVID19, upper GI stomach issues, aorta type A dissection (2011) with aortic valve replacement, bypass of R coronary artery, HTN, hyperlipidemia, pre-diabetes, AFIB, CHF, depression, anxiety, OA, GERD, atypical migraine, asthma, and allergic rhinitis.    Limitations Lifting;House hold activities;Other (comment)   driving   How long can you sit comfortably? Not affected    How long can you stand comfortably? Not affected    How long can you walk comfortably? Pt reports she has pain only if she is carrying something    Diagnostic tests DG R shoulder 02/09/2021 per chart "Impression: Possible hardware loosening post total shoulder arthroplasty. No  evidence of acute fracture or dislocation"    Patient Stated Goals Be able to drive without pain    Currently in Pain? Yes    Pain Score 2     Pain Location Shoulder    Pain Orientation Right    Pain Onset More than a month ago          TREATMENT   THEREX   Pt performed 10-12 reps for the following exercises within pain-free range:   Shoulder shrugs - 2 sets progressed to 4# dumbbells each UE for second set, cuing for pain-free ROM  Shoulder retractions - 2 sets  Pendulums - CC/CW - 1 set  Cat-cow -  2 sets. Pt reports no pain with exercise.  Bird-dog with UE reaches - 2 sets. No pain with exercise.   Upper trap  stretch - 1x30 sec B  Levator Scap stretch 1x30 sec B  Chin tucks 10x with 5 sec hold  Cervical ext. Stretch - 60 sec  Matrix standing shoulder rows - 7.5# 2x10 to 12.5# for 1x8  Push-ups with pt performing only eccentric component with RUE - x multiple reps. Frequent cuing/demo for technique.  Seated eccentric bicep curl, R U - 0.5-3# 1x10 and 1x8  Lifting technique with approx 5# box - 2x10. Frequent cuing/demo for technique. 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    Manual-  Pt supine with elasto-gel applied over R shoulder musculature, anterior and posterior, for duration of manual therapy while PT provides STM to R upper trap, R posterior RTC musculature, R cervical paraspinal, and R bicep, R suboccip release. Trigger points noted in cervical paraspinals, anterior delt., and throughout posterior shoulder.     Elasto-gel removed, pt with no reports of adverse reaction. Pt also reports improved sx with STM and that shoulder "feels good" at end of session.   Education provided primarily in the form of VC/TC/Demo to facilitate correct movement at target joints and improved muscle activation with all exercises performed today. Pt exhibits good carryover within session after cuing.    Plan - 05/20/21 1702    Clinical Impression Statement Pt highly motivated throughout session. Pt reports no pain with all exercises performed, only slight discomfort with wall push-up. Wall push-up again modified to focus on eccentric phase of movement and pt reported no pain. PT reinforced lifting technique as pt still shows increased use of B elbow flexors to complete movement. The pt will benefit from further skilled PT to improve pain, UE ROM and strength to increase QOL.    Personal Factors and Comorbidities Comorbidity 1;Comorbidity 2;Comorbidity 3+;Past/Current Experience;Social Background;Time since onset of  injury/illness/exacerbation;Transportation;Fitness;Other    Comorbidities Pertinent: recent hx of COVID19, HTN, hyperlipidemia, pre-diabetes, AFIB, CHF, depression, anxiety, OA, atypical migraine, asthma.    Examination-Activity Limitations Bathing;Bed Mobility;Hygiene/Grooming;Lift;Caring for Others;Reach Overhead;Carry;Dressing;Sleep;Other    Examination-Participation Restrictions Laundry;Cleaning;Driving;Community Activity;Other;Yard Work;Shop;Meal Prep;Volunteer    Stability/Clinical Decision Making Stable/Uncomplicated    Rehab Potential Good    PT Frequency 2x / week    PT Duration 12 weeks    PT Treatment/Interventions ADLs/Self Care Home Management;Biofeedback;Cryotherapy;Electrical Stimulation;Moist Heat;Traction;Ultrasound;DME Instruction;Functional mobility training;Therapeutic activities;Therapeutic exercise;Neuromuscular re-education;Patient/family education;Orthotic Fit/Training;Manual techniques;Scar mobilization;Passive range of motion;Dry needling;Taping;Energy conservation;Joint Manipulations;Spinal Manipulations;Gait training    PT Next Visit Plan Progress shoulder strengthening exercises; cervical stretches within pain tolerance, shoulder stability exercises/closed kinetic chain, closed kinetic chain push-up plus, continue plan as previously indicated    PT Home Exercise Plan Issued HEP with handout: RTB rows, RTB shoulder ER pull-aparts, and upper trap stretch; shoulder perturbation exercises, ROM for shoulder IR within pain-free range, mobilizations/distraction to R shoulder, continue training correct lifting mechanics in order to reduce strain on RUE    Consulted and Agree with Plan of Care Patient              PT Education - 05/20/21 1701    Education Details body mechanics, exercise technique    Person(s) Educated Patient    Methods Explanation;Demonstration;Tactile cues;Verbal cues    Comprehension Verbalized understanding;Returned demonstration            PT  Short Term Goals - 05/08/21 0959      PT SHORT TERM GOAL #1   Title Patient will be independent in home exercise program to improve strength/mobility for better functional independence with ADLs.    Baseline 02/24/2021 Pt issued HEP; 5/19: HEP to be advanced, pt has been unable to perform HEP consistently d/t spouse surgery and son's wedding.    Time 6    Period Weeks    Status On-going    Target Date 06/17/21             PT Long Term Goals - 05/08/21 0959      PT LONG TERM GOAL #1   Title Patient will report a worst pain of 3/10  in last 7 days in her R shoulder to  improve tolerance with ADLs and reduced symptoms with activities.    Baseline 02/24/2021: Pt reports worst pain as 10/10.; 5/18: 4/10 is worst pain    Time 12    Period Weeks    Status Partially Met    Target Date 07/29/21      PT LONG TERM GOAL #2   Title Patient will decrease Quick DASH scores by > 8 points demonstrating reduced self-reported upper extremity disability.    Baseline 02/24/2021: QuickDash Disability/symptom score 40, Work score 37.5; 5/18: Disability/symtpom 38.6, Work 43.75    Time 12    Period Weeks    Status On-going    Target Date 07/29/21      PT LONG TERM GOAL #3   Title Patient will increase FOTO score to equal to or greater than 60 to demonstrate statistically significant improvement in mobility and quality of life.    Baseline 02/24/2021: 55; 5/18: 60    Time 12    Period Weeks    Status Achieved      PT LONG TERM GOAL #4   Title Patient will increase BUE MMT scores by at least 1/2 point as to improve functional strength and endurance for overhead activities and ADLs such as driving and washing her hair.    Baseline 02/24/2021: L/R shoulder flexion 4/5, 3+/5; shoulder abduction 4/5 B; shoulder extension 4+5 B; shoulder IR/ER both grossly 4/5 B; elbow flexion/extension 5/5 for both B; wrist flexion/extension 5/5 for both B; 5/18: L shoulder is grossly 4+/5.  R shoulder flexion 3+/5; R shoulder  abduction 4-/5; R shoulder IR/ER both 4-/5 (B); R elbow flexion 4/5, elbow ext. 5/5 B, shoulder ext. 5/5 B    Time 12    Period Weeks    Status On-going    Target Date 07/29/21           Patient will benefit from skilled therapeutic intervention in order to improve the following deficits and impairments:  Increased fascial restricitons,Improper body mechanics,Pain,Decreased mobility,Increased muscle spasms,Postural dysfunction,Decreased activity tolerance,Decreased endurance,Decreased range of motion,Decreased strength,Hypomobility,Impaired UE functional use,Impaired flexibility,Decreased coordination  Visit Diagnosis: Muscle weakness (generalized)  Chronic right shoulder pain     Problem List Patient Active Problem List   Diagnosis Date Noted  . Abdominal pain, right lateral 10/21/2020  . Acute bronchitis with bronchospasm 05/28/2020  . Diarrhea 04/11/2020  . Epigastric pain 02/19/2020  . Dark stools 02/19/2020  . PAF (paroxysmal atrial fibrillation) ([place]) 02/05/2019  . Shortness of breath 01/27/2019  . Irregular surface of cornea 12/25/2018  . HPV (human papilloma virus) infection 12/01/2016  . Screening mammogram, encounter for 11/29/2016  . Estrogen deficiency 11/29/2016  . Encounter for routine gynecological examination 11/29/2016  . Grade III hemorrhoids 10/06/2016  . Tachycardia 08/14/2016  . Hemorrhoids 08/13/2016  . Atypical migraine 08/03/2016  . CAD (coronary artery disease)   . AAA (abdominal aortic aneurysm) ([place])   . Chronic combined systolic (congestive) and diastolic (congestive) heart failure ([place])   . Prediabetes 02/11/2016  . Generalized anxiety disorder 12/08/2015  . Panic attacks 07/28/2015  . Sensorineural hearing loss 03/27/2013  . Vitamin D deficiency 07/03/2012  . H/O aortic dissection 05/12/2012  . Class 2 obesity due to excess calories with body mass index (BMI) of 35.0 to 35.9 in adult 11/21/2011  . Obesity (BMI 30.0-34.9) 11/21/2011  .  Routine general medical examination at a health care facility 09/20/2011  . S/P AVR (aortic valve replacement) 01/07/2011  . CORONARY ARTERY BYPASS GRAFT, HX OF 01/07/2011  . Arthritis 12/20/2010  .  Back pain 12/15/2010  . Dissection of thoracic aorta ([place]) 12/11/2010  . H/O dissecting abdominal aortic aneurysm repair 08/24/2010  . IRRITABLE BOWEL SYNDROME 09/09/2009  . Obstructive sleep apnea 08/04/2009  . External hemorrhoid 06/26/2009  . Osteoporosis 01/09/2009  . Hyperlipidemia 07/26/2007  . Depression with anxiety 07/26/2007  . Essential hypertension 07/26/2007  . Allergic rhinitis 07/26/2007  . Asthma 07/26/2007  . GERD 07/26/2007  . Osteoarthritis 07/26/2007  . Ocular migraine 07/26/2007  . Mild intermittent asthma without complication 25/48/6282   Ricard Dillon PT, DPT 05/20/2021, 5:06 PM  [place] MAIN [facility] SERVICES [address] [place], [place], [address] Phone: [phone]   Fax:  [phone]  Name: Rachel Torres MRN: [id number] Date of Birth: August 17, 1956

## 2021-05-22 ENCOUNTER — Ambulatory Visit: Payer: Medicare HMO

## 2021-05-25 ENCOUNTER — Ambulatory Visit: Payer: Medicare HMO

## 2021-05-26 ENCOUNTER — Ambulatory Visit (INDEPENDENT_AMBULATORY_CARE_PROVIDER_SITE_OTHER): Payer: Medicare HMO | Admitting: Psychology

## 2021-05-26 DIAGNOSIS — F331 Major depressive disorder, recurrent, moderate: Secondary | ICD-10-CM

## 2021-05-26 DIAGNOSIS — F411 Generalized anxiety disorder: Secondary | ICD-10-CM | POA: Diagnosis not present

## 2021-05-27 ENCOUNTER — Other Ambulatory Visit: Payer: Self-pay

## 2021-05-27 ENCOUNTER — Ambulatory Visit: Payer: Medicare HMO

## 2021-05-27 DIAGNOSIS — G8929 Other chronic pain: Secondary | ICD-10-CM | POA: Diagnosis not present

## 2021-05-27 DIAGNOSIS — R2689 Other abnormalities of gait and mobility: Secondary | ICD-10-CM | POA: Diagnosis not present

## 2021-05-27 DIAGNOSIS — M25511 Pain in right shoulder: Secondary | ICD-10-CM | POA: Diagnosis not present

## 2021-05-27 DIAGNOSIS — R293 Abnormal posture: Secondary | ICD-10-CM

## 2021-05-27 DIAGNOSIS — M6281 Muscle weakness (generalized): Secondary | ICD-10-CM

## 2021-05-27 DIAGNOSIS — M542 Cervicalgia: Secondary | ICD-10-CM | POA: Diagnosis not present

## 2021-05-27 DIAGNOSIS — R278 Other lack of coordination: Secondary | ICD-10-CM

## 2021-05-27 NOTE — Therapy (Signed)
[place] MAIN [facility] SERVICES [address] [place], [place], [address] Phone: [phone]   Fax:  [phone]  Physical Therapy Treatment  Patient Details  Name: LAISHA RAU MRN: [id number] Date of Birth: 1956-10-01 Referring Provider (PT): Gregor Hams, MD   Encounter Date: 05/27/2021   PT End of Session - 05/27/21 1511    Visit Number 13    Number of Visits 41    Date for PT Re-Evaluation 07/29/21    Authorization Type Eval performed 05/21/354 (recert for 08/26/4162); new recert for 8/45/3646    PT Start Time 1520    PT Stop Time 1600    PT Time Calculation (min) 40 min    Activity Tolerance Patient tolerated treatment well;No increased pain    Behavior During Therapy WFL for tasks assessed/performed           Past Medical History:  Diagnosis Date  . AAA (abdominal aortic aneurysm) ([place])    a. Chronic w/o evidence of aneurysmal dil on CTA 01/2016.  Marland Kitchen Alcohol abuse   . Allergy   . Anemia   . Anxiety   . CAD (coronary artery disease)    a. 08/2010 s/p CABG x 1 (VG->RCA) @ time of Ao dissection repair; b. 01/2016 Lexiscan MV: mid antsept/apical defect w/ ? peri-infarct ischemia-->likely attenuation-->Med Rx.  . Chewing difficulty   . Chronic combined systolic (congestive) and diastolic (congestive) heart failure ([place])    a. 2012 EF 30-35%; b. 04/2015 EF 40-45%; c. 01/2016 Echo: Ef 55-65%, nl AoV bioprosthesis, nl RV, nl PASP.  Marland Kitchen Constipation   . Depression   . Edema of both lower extremities   . Gallbladder sludge   . GERD (gastroesophageal reflux disease)   . Heart failure ([place])   . Heart valve problem   . History of Bicuspid Aortic Valve    a. 08/2010 s/p AVR @ time of Ao dissection repair; b. 01/2016 Echo: Ef 55-65%, nl AoV bioprosthesis, nl RV, nl PASP.  Marland Kitchen Hx of repair of dissecting thoracic aortic aneurysm, [place] type A    a. 08/2010 s/p repair with AVR and VG->RCA; b. 01/2016 CTA: stable appearance of Asc Thoracic Aortic  graft. Opacification of flase lumen of chronic abd Ao dissection w/ retrograde flow through lumbar arteries. No aneurysmal dil of flase lumen.  . Hyperlipidemia   . Hypertensive heart disease   . IBS (irritable bowel syndrome)   . Internal hemorrhoids   . Kidney problem   . Marfan syndrome    pt denies  . Migraine   . Obstructive sleep apnea   . Osteoarthritis   . Osteoporosis   . Palpitations   . Pneumonia   . RA (rheumatoid arthritis) ([place])    a. Followed by Dr. [name]    Past Surgical History:  Procedure Laterality Date  . ABDOMINAL AORTIC ANEURYSM REPAIR    . CARDIAC CATHETERIZATION  2001  . [place]  . CHOLECYSTECTOMY    . COLONOSCOPY    . ESOPHAGOGASTRODUODENOSCOPY (EGD) WITH PROPOFOL N/A 05/08/2020   Procedure: ESOPHAGOGASTRODUODENOSCOPY (EGD) WITH PROPOFOL;  Surgeon: Virgel Manifold, MD;  Location: [facility] ENDOSCOPY;  Service: Endoscopy;  Laterality: N/A;  . FRACTURE SURGERY Right    shoulder replacement  . HEMORRHOID BANDING    . ORIF DISTAL RADIUS FRACTURE Left   . UPPER GASTROINTESTINAL ENDOSCOPY      There were no vitals filed for this visit.   Subjective Assessment - 05/27/21 1443    Subjective  Pt reports she isdoing well today. Pain 2/10, a littl emore aggravated, was moving furniture and overdid it. No othe rupdates.    Pertinent History Pt is a 65 y/o female presenting to therapy due to R shoulder pain. Pt had R shoulder replacement 12-13 years ago.  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 She says shoulder pain started this past January as she was helping lift her husband and his assistive devices. She reports she has since stopped lifting him and his walker as they have caregivers who come to the home for 16 hours/week and can help. Pt is most concerned that her shoulder hurts when she drives and washes her hair. Her shoulder does not hurt constantly, but she states she has pain "the more I  use it." She feels a catch and stabbing sensation in R anterior shoulder that can travel into bicep and that sometimes the pain is a burning sensation. She sometimes has pain in L shoulder.  She rates her worst R shoulder pain as 10/10, best pain is 0/10. Her R shoulder pain is currently 1.5/10. She reports that rest, ice and naproxen help with pain relief. She denies redness/swelling/temp difference and N/T in UEs.  Patient has been seen by other therapists in this clinic previously for cervicalgia and back related injuries/pain. Pt PMH includes COVID19, upper GI stomach issues, aorta type A dissection (2011) with aortic valve replacement, bypass of R coronary artery, HTN, hyperlipidemia, pre-diabetes, AFIB, CHF, depression, anxiety, OA, GERD, atypical migraine, asthma, and allergic rhinitis.    Currently in Pain? Yes    Pain Score 2     Pain Location --   right anterior shoulder         INTERVENTION THIS DATE: -MFR to Anterior deltoid -MFR to upper traps  (shoulder), then at c5-c7 Rt lateral neck  -Rt upper trap ART paired with Shoulder extension from 90 degrees flexion  -Rt anterior deltoid ART paired with GHJ ER, arm in neutral  -seated scapular retraction isometrics 1x10x3secH  -seated BUE ER in supination c scap retraction 1x10 Green, 1x10 yellow -seated prayer stretch 2x15 sec, added in cervical flrexion as well (increases shoulder pain minimally, breasts get in way per patient)      PT Short Term Goals - 05/08/21 0959      PT SHORT TERM GOAL #1   Title Patient will be independent in home exercise program to improve strength/mobility for better functional independence with ADLs.    Baseline 02/24/2021 Pt issued HEP; 5/19: HEP to be advanced, pt has been unable to perform HEP consistently d/t spouse surgery and son's wedding.    Time 6    Period Weeks    Status On-going    Target Date 06/17/21             PT Long Term Goals - 05/08/21 0959      PT LONG TERM GOAL #1   Title  Patient will report a worst pain of 3/10  in last 7 days in her R shoulder to improve tolerance with ADLs and reduced symptoms with activities.    Baseline 02/24/2021: Pt reports worst pain as 10/10.; 5/18: 4/10 is worst pain    Time 12    Period Weeks    Status Partially Met    Target Date 07/29/21      PT LONG TERM GOAL #2   Title Patient will decrease Quick DASH scores by > 8 points demonstrating reduced  self-reported upper extremity disability.    Baseline 02/24/2021: QuickDash Disability/symptom score 40, Work score 37.5; 5/18: Disability/symtpom 38.6, Work 43.75    Time 12    Period Weeks    Status On-going    Target Date 07/29/21      PT LONG TERM GOAL #3   Title Patient will increase FOTO score to equal to or greater than 60 to demonstrate statistically significant improvement in mobility and quality of life.    Baseline 02/24/2021: 55; 5/18: 60    Time 12    Period Weeks    Status Achieved      PT LONG TERM GOAL #4   Title Patient will increase BUE MMT scores by at least 1/2 point as to improve functional strength and endurance for overhead activities and ADLs such as driving and washing her hair.    Baseline 02/24/2021: L/R shoulder flexion 4/5, 3+/5; shoulder abduction 4/5 B; shoulder extension 4+5 B; shoulder IR/ER both grossly 4/5 B; elbow flexion/extension 5/5 for both B; wrist flexion/extension 5/5 for both B; 5/18: L shoulder is grossly 4+/5.  R shoulder flexion 3+/5; R shoulder abduction 4-/5; R shoulder IR/ER both 4-/5 (B); R elbow flexion 4/5, elbow ext. 5/5 B, shoulder ext. 5/5 B    Time 12    Period Weeks    Status On-going    Target Date 07/29/21                 Plan - 05/27/21 1554    Clinical Impression Statement Continued with current plan of care as laid out in evaluation and recent prior sessions. Extensive time spent addressing myofascial restrictions and muscle spasm in Right deltoid and upper traps- significant improvement in pain-free movement afterward.  Pt remains motivated to advance progress toward goals. Rest breaks provided as needed, pt quick to ask when needed. Author maintains all interventions within appropriate level of intensity as not to purposefully exacerbate pain. Pt does require varying levels of assistance and cuing for completion of exercises for correct form and sometimes due to pain/weakness. Pt continues to demonstrate progress toward goals AEB progression of some interventions this date either in volume or intensity. No updates to HEP this date.    Personal Factors and Comorbidities Comorbidity 1;Comorbidity 2;Comorbidity 3+;Past/Current Experience;Social Background;Time since onset of injury/illness/exacerbation;Transportation;Fitness;Other    Comorbidities Pertinent: recent hx of COVID19, HTN, hyperlipidemia, pre-diabetes, AFIB, CHF, depression, anxiety, OA, atypical migraine, asthma.    Examination-Activity Limitations Bathing;Bed Mobility;Hygiene/Grooming;Lift;Caring for Others;Reach Overhead;Carry;Dressing;Sleep;Other    Examination-Participation Restrictions Laundry;Cleaning;Driving;Community Activity;Other;Yard Work;Shop;Meal Prep;Volunteer    Stability/Clinical Decision Making Stable/Uncomplicated    Clinical Decision Making Low    Rehab Potential Good    PT Frequency 2x / week    PT Duration 12 weeks    PT Treatment/Interventions ADLs/Self Care Home Management;Biofeedback;Cryotherapy;Electrical Stimulation;Moist Heat;Traction;Ultrasound;DME Instruction;Functional mobility training;Therapeutic activities;Therapeutic exercise;Neuromuscular re-education;Patient/family education;Orthotic Fit/Training;Manual techniques;Scar mobilization;Passive range of motion;Dry needling;Taping;Energy conservation;Joint Manipulations;Spinal Manipulations;Gait training    PT Next Visit Plan Progress shoulder strengthening exercises; cervical stretches within pain tolerance, shoulder stability exercises/closed kinetic chain, closed kinetic chain  push-up plus, continue plan as previously indicated    PT Home Exercise Plan Issued HEP with handout: RTB rows, RTB shoulder ER pull-aparts, and upper trap stretch; shoulder perturbation exercises, ROM for shoulder IR within pain-free range, mobilizations/distraction to R shoulder, continue training correct lifting mechanics in order to reduce strain on RUE    Consulted and Agree with Plan of Care Patient           Patient will benefit  from skilled therapeutic intervention in order to improve the following deficits and impairments:  Increased fascial restricitons,Improper body mechanics,Pain,Decreased mobility,Increased muscle spasms,Postural dysfunction,Decreased activity tolerance,Decreased endurance,Decreased range of motion,Decreased strength,Hypomobility,Impaired UE functional use,Impaired flexibility,Decreased coordination  Visit Diagnosis: Muscle weakness (generalized)  Chronic right shoulder pain  Other abnormalities of gait and mobility  Other lack of coordination  Abnormal posture  Cervicalgia     Problem List Patient Active Problem List   Diagnosis Date Noted  . Abdominal pain, right lateral 10/21/2020  . Acute bronchitis with bronchospasm 05/28/2020  . Diarrhea 04/11/2020  . Epigastric pain 02/19/2020  . Dark stools 02/19/2020  . PAF (paroxysmal atrial fibrillation) ([place]) 02/05/2019  . Shortness of breath 01/27/2019  . Irregular surface of cornea 12/25/2018  . HPV (human papilloma virus) infection 12/01/2016  . Screening mammogram, encounter for 11/29/2016  . Estrogen deficiency 11/29/2016  . Encounter for routine gynecological examination 11/29/2016  . Grade III hemorrhoids 10/06/2016  . Tachycardia 08/14/2016  . Hemorrhoids 08/13/2016  . Atypical migraine 08/03/2016  . CAD (coronary artery disease)   . AAA (abdominal aortic aneurysm) ([place])   . Chronic combined systolic (congestive) and diastolic (congestive) heart failure ([place])   . Prediabetes 02/11/2016   . Generalized anxiety disorder 12/08/2015  . Panic attacks 07/28/2015  . Sensorineural hearing loss 03/27/2013  . Vitamin D deficiency 07/03/2012  . H/O aortic dissection 05/12/2012  . Class 2 obesity due to excess calories with body mass index (BMI) of 35.0 to 35.9 in adult 11/21/2011  . Obesity (BMI 30.0-34.9) 11/21/2011  . Routine general medical examination at a health care facility 09/20/2011  . S/P AVR (aortic valve replacement) 01/07/2011  . CORONARY ARTERY BYPASS GRAFT, HX OF 01/07/2011  . Arthritis 12/20/2010  . Back pain 12/15/2010  . Dissection of thoracic aorta ([place]) 12/11/2010  . H/O dissecting abdominal aortic aneurysm repair 08/24/2010  . IRRITABLE BOWEL SYNDROME 09/09/2009  . Obstructive sleep apnea 08/04/2009  . External hemorrhoid 06/26/2009  . Osteoporosis 01/09/2009  . Hyperlipidemia 07/26/2007  . Depression with anxiety 07/26/2007  . Essential hypertension 07/26/2007  . Allergic rhinitis 07/26/2007  . Asthma 07/26/2007  . GERD 07/26/2007  . Osteoarthritis 07/26/2007  . Ocular migraine 07/26/2007  . Mild intermittent asthma without complication 67/61/9509   4:18 PM, 05/27/21 Etta Grandchild, PT, DPT Physical Therapist - [place] [phone]    Etta Grandchild 05/27/2021, 3:58 PM  [place] MAIN [facility] SERVICES [address] [place], [place], [address] Phone: [phone]   Fax:  [phone]  Name: ARANTXA PIERCEY MRN: [id number] Date of Birth: Jan 05, 1956

## 2021-06-01 ENCOUNTER — Ambulatory Visit: Payer: Medicare HMO

## 2021-06-02 ENCOUNTER — Other Ambulatory Visit: Payer: Self-pay

## 2021-06-02 ENCOUNTER — Ambulatory Visit: Payer: Medicare HMO

## 2021-06-02 DIAGNOSIS — R293 Abnormal posture: Secondary | ICD-10-CM | POA: Diagnosis not present

## 2021-06-02 DIAGNOSIS — R278 Other lack of coordination: Secondary | ICD-10-CM | POA: Diagnosis not present

## 2021-06-02 DIAGNOSIS — M6281 Muscle weakness (generalized): Secondary | ICD-10-CM

## 2021-06-02 DIAGNOSIS — G8929 Other chronic pain: Secondary | ICD-10-CM | POA: Diagnosis not present

## 2021-06-02 DIAGNOSIS — M25511 Pain in right shoulder: Secondary | ICD-10-CM

## 2021-06-02 DIAGNOSIS — R2689 Other abnormalities of gait and mobility: Secondary | ICD-10-CM | POA: Diagnosis not present

## 2021-06-02 DIAGNOSIS — M542 Cervicalgia: Secondary | ICD-10-CM | POA: Diagnosis not present

## 2021-06-02 NOTE — Progress Notes (Signed)
Chief Complaint:   OBESITY Rachel Torres is here to discuss her progress with her obesity treatment plan along with follow-up of her obesity related diagnoses. Rachel Torres is on following a lower carbohydrate, vegetable and lean protein rich diet plan and states she is following her eating plan approximately 75% of the time. Rachel Torres states she is walking and doing physical therapy for 20 minutes 2 times per week.  Today's visit was #: 14 Starting weight: 213 lbs Starting date: 05/05/2020 Today's weight: 179 lbs Today's date: 05/20/2021 Total lbs lost to date: 34 Total lbs lost since last in-office visit: 3  Interim History: Rachel Torres continues to do well with weight loss even with some celebration eating situations. She did notice when she started to eat more simple carbohydrates, it was difficult to stop. She feels she will be able to stay on track now.  Subjective:   1. Pre-diabetes Rachel Torres is doing well with Ozempic and she denies nausea or vomiting. She notes she can eat more and she is ok with increasing her dose at this point.  Assessment/Plan:   1. Pre-diabetes Rachel Torres agd to increase Ozempic to 0.5 mg q weekly with no refills. She will continue to work on weight loss, exercise, and decreasing simple carbohydrates to help decrease the risk of diabetes.   - Semaglutide,0.25 or 0.5MG /DOS, (OZEMPIC, 0.25 OR 0.5 MG/DOSE,) 2 MG/1.5ML SOPN; Inject 0.5 mg into the skin once a week.  Dispense: 1.5 mL; Refill: 0  2. Obesity with current BMI 29.8 Rachel Torres is currently in the action stage of change. As such, her goal is to continue with weight loss efforts. She has agreed to following a lower carbohydrate, vegetable and lean protein rich diet plan.   Exercise goals: As is.  Behavioral modification strategies: increasing lean protein intake and celebration eating strategies.  Rachel Torres has agreed to follow-up with our clinic in 4 weeks. She was informed of the importance of frequent follow-up  visits to maximize her success with intensive lifestyle modifications for her multiple health conditions.   Objective:   Blood pressure 113/71, pulse 79, temperature 97.7 F (36.5 C), height 5\' 5"  (1.651 m), weight 179 lb (81.2 kg), SpO2 97 %. Body mass index is 29.79 kg/m.  General: Cooperative, alert, well developed, in no acute distress. HEENT: Conjunctivae and lids unremarkable. Cardiovascular: Regular rhythm.  Lungs: Normal work of breathing. Neurologic: No focal deficits.   Lab Results  Component Value Date   CREATININE 0.91 02/26/2021   BUN 16 02/26/2021   NA 137 02/26/2021   K 4.7 02/26/2021   CL 100 02/26/2021   CO2 21 02/26/2021   Lab Results  Component Value Date   ALT 18 02/26/2021   AST 18 02/26/2021   ALKPHOS 75 02/26/2021   BILITOT 0.5 02/26/2021   Lab Results  Component Value Date   HGBA1C 5.5 02/26/2021   HGBA1C 5.7 (H) 10/29/2020   HGBA1C 5.6 07/23/2020   HGBA1C 5.9 (H) 05/05/2020   HGBA1C 6.0 06/07/2019   Lab Results  Component Value Date   INSULIN 17.3 02/26/2021   INSULIN 13.9 10/29/2020   INSULIN 22.5 07/23/2020   INSULIN 25.2 (H) 05/05/2020   Lab Results  Component Value Date   TSH 1.040 05/05/2020   Lab Results  Component Value Date   CHOL 219 (H) 02/26/2021   HDL 64 02/26/2021   LDLCALC 141 (H) 02/26/2021   LDLDIRECT 189.4 10/13/2011   TRIG 77 02/26/2021   CHOLHDL 3.4 02/26/2021   Lab Results  Component Value  Date   WBC 6.0 10/21/2020   HGB 13.6 10/21/2020   HCT 40.3 10/21/2020   MCV 93.6 10/21/2020   PLT 288.0 10/21/2020   Lab Results  Component Value Date   FERRITIN 105.0 12/11/2010    Obesity Behavioral Intervention:   Approximately 15 minutes were spent on the discussion below.  ASK: We discussed the diagnosis of obesity with Rachel Torres today and Rachel Torres agreed to give [place] permission to discuss obesity behavioral modification therapy today.  ASSESS: Rachel Torres has the diagnosis of obesity and her BMI today is 29.79.  Rachel Torres is in the action stage of change.   ADVISE: Rachel Torres was educated on the multiple health risks of obesity as well as the benefit of weight loss to improve her health. She was advised of the need for long term treatment and the importance of lifestyle modifications to improve her current health and to decrease her risk of future health problems.  AGREE: Multiple dietary modification options and treatment options were discussed and Rachel Torres agreed to follow the recommendations documented in the above note.  ARRANGE: Rachel Torres was educated on the importance of frequent visits to treat obesity as outlined per CMS and USPSTF guidelines and agreed to schedule her next follow up appointment today.  Attestation Statements:   Reviewed by clinician on day of visit: allergies, medications, problem list, medical history, surgical history, family history, social history, and previous encounter notes.   I, Rachel Torres, am acting as transcriptionist for [name], MD.  I have reviewed the above documentation for accuracy and completeness, and I agree with the above. -  [name], MD

## 2021-06-02 NOTE — Therapy (Signed)
[place] MAIN [facility] SERVICES [address] [place], [place], [address] Phone: [phone]   Fax:  [phone]  Physical Therapy Treatment  Patient Details  Name: Rachel Torres MRN: [id number] Date of Birth: Aug 14, 1956 Referring Provider (PT): Gregor Hams, MD   Encounter Date: 06/02/2021   PT End of Session - 06/02/21 1240     Visit Number 14    Number of Visits 41    Date for PT Re-Evaluation 07/29/21    Authorization Type Eval performed 04/23/6567 (recert for 12/22/7515); new recert for 0/12/7492    PT Start Time 0930    PT Stop Time 4967    PT Time Calculation (min) 44 min    Activity Tolerance Patient tolerated treatment well;No increased pain    Behavior During Therapy WFL for tasks assessed/performed             Past Medical History:  Diagnosis Date   AAA (abdominal aortic aneurysm) ([place])    a. Chronic w/o evidence of aneurysmal dil on CTA 01/2016.   Alcohol abuse    Allergy    Anemia    Anxiety    CAD (coronary artery disease)    a. 08/2010 s/p CABG x 1 (VG->RCA) @ time of Ao dissection repair; b. 01/2016 Lexiscan MV: mid antsept/apical defect w/ ? peri-infarct ischemia-->likely attenuation-->Med Rx.   Chewing difficulty    Chronic combined systolic (congestive) and diastolic (congestive) heart failure ([place])    a. 2012 EF 30-35%; b. 04/2015 EF 40-45%; c. 01/2016 Echo: Ef 55-65%, nl AoV bioprosthesis, nl RV, nl PASP.   Constipation    Depression    Edema of both lower extremities    Gallbladder sludge    GERD (gastroesophageal reflux disease)    Heart failure (HCC)    Heart valve problem    History of Bicuspid Aortic Valve    a. 08/2010 s/p AVR @ time of Ao dissection repair; b. 01/2016 Echo: Ef 55-65%, nl AoV bioprosthesis, nl RV, nl PASP.   Hx of repair of dissecting thoracic aortic aneurysm, [place] type A    a. 08/2010 s/p repair with AVR and VG->RCA; b. 01/2016 CTA: stable appearance of Asc Thoracic Aortic graft.  Opacification of flase lumen of chronic abd Ao dissection w/ retrograde flow through lumbar arteries. No aneurysmal dil of flase lumen.   Hyperlipidemia    Hypertensive heart disease    IBS (irritable bowel syndrome)    Internal hemorrhoids    Kidney problem    Marfan syndrome    pt denies   Migraine    Obstructive sleep apnea    Osteoarthritis    Osteoporosis    Palpitations    Pneumonia    RA (rheumatoid arthritis) ([place])    a. Followed by Dr. [name]    Past Surgical History:  Procedure Laterality Date   ABDOMINAL AORTIC ANEURYSM REPAIR     CARDIAC CATHETERIZATION  2001   [place]     ESOPHAGOGASTRODUODENOSCOPY (EGD) WITH PROPOFOL N/A 05/08/2020   Procedure: ESOPHAGOGASTRODUODENOSCOPY (EGD) WITH PROPOFOL;  Surgeon: Virgel Manifold, MD;  Location: [facility] ENDOSCOPY;  Service: Endoscopy;  Laterality: N/A;   FRACTURE SURGERY Right    shoulder replacement   HEMORRHOID BANDING     ORIF DISTAL RADIUS FRACTURE Left    UPPER GASTROINTESTINAL ENDOSCOPY      There were no vitals filed for this visit.   Subjective Assessment - 06/02/21 1225  Subjective Patient reports her shoulder hurt when washing her hair. No falls or LOB since last session. Her washer lid hit her in the head leaving a bruise but no concussion.    Pertinent History Pt is a 65 y/o female presenting to therapy due to R shoulder pain. Pt had R shoulder replacement 12-13 years ago.  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 She says shoulder pain started this past January as she was helping lift her husband and his assistive devices. She reports she has since stopped lifting him and his walker as they have caregivers who come to the home for 16 hours/week and can help. Pt is most concerned that her shoulder hurts when she drives and washes her hair. Her shoulder does not hurt constantly, but she states she has pain "the  more I use it." She feels a catch and stabbing sensation in R anterior shoulder that can travel into bicep and that sometimes the pain is a burning sensation. She sometimes has pain in L shoulder.  She rates her worst R shoulder pain as 10/10, best pain is 0/10. Her R shoulder pain is currently 1.5/10. She reports that rest, ice and naproxen help with pain relief. She denies redness/swelling/temp difference and N/T in UEs.  Patient has been seen by other therapists in this clinic previously for cervicalgia and back related injuries/pain. Pt PMH includes COVID19, upper GI stomach issues, aorta type A dissection (2011) with aortic valve replacement, bypass of R coronary artery, HTN, hyperlipidemia, pre-diabetes, AFIB, CHF, depression, anxiety, OA, GERD, atypical migraine, asthma, and allergic rhinitis.    Limitations Lifting;House hold activities;Other (comment)    How long can you sit comfortably? Not affected    How long can you stand comfortably? Not affected    How long can you walk comfortably? Pt reports she has pain only if she is carrying something    Diagnostic tests DG R shoulder 02/09/2021 per chart "Impression: Possible hardware loosening post total shoulder arthroplasty. No  evidence of acute fracture or dislocation"    Patient Stated Goals Be able to drive without pain    Currently in Pain? Yes    Pain Score 3     Pain Location Shoulder    Pain Orientation Right;Anterior    Pain Descriptors / Indicators Aching    Pain Type Chronic pain    Pain Onset More than a month ago    Pain Frequency Intermittent               TherEx  Supine: PVC pipe overhead raise 10x RTB ER 15x; cues for slight increased extension of bicep for decreased demand on bicep  Scapular punches 3lb dumbell 10x Scapular stabilization against RTB pertubation 2x 60 second holds  Manual: STM to anterior shoulder, bicep, and upper trap with trigger point technique x 4 minutes Bicep trigger point with bicep  lengthening movement x3 minutes AP right shoulder mobilization 4x 15 second grade II mobilizations Inferior right shoulder mobilization 4x 15 second grade II mobilizations.  Cross body adduction with distraction x 10 with 5 second holds   Trigger Point Dry Needling (TDN), unbilled Education performed with patient regarding potential benefit of TDN. Reviewed precautions and risks with patient. Reviewed special precautions/risks over lung fields which include pneumothorax. Reviewed signs and symptoms of pneumothorax and advised pt to go to ER immediately if these symptoms develop advise them of dry needling treatment. Extensive time spent with pt to ensure  full understanding of TDN risks. Pt provided verbal consent to treatment. TDN performed to  with 0.25 x 40 single needle placements with local twitch response (LTR). Pistoning technique utilized. Improved pain-free motion following intervention. Body part: R bicep and R upper trap. X3 minutes     Pt educated throughout session about proper posture and technique with exercises. Improved exercise technique, movement at target joints, use of target muscles after min to mod verbal, visual, tactile cues   Patient has diffuse tenderness throughout bicep and anterior right shoulder region. Combination of trigger point dry needling and manual decrease pain and tenderness by end of session. She continues to progress with functional stabilization and strengthening interventions. The pt will benefit from further skilled PT to improve pain, UE ROM and strength to increase QOL.            PT Education - 06/02/21 1238     Education Details exercise technique, body mechanics, manual    Person(s) Educated Patient    Methods Demonstration;Explanation;Tactile cues;Verbal cues    Comprehension Verbalized understanding;Returned demonstration;Verbal cues required;Tactile cues required              PT Short Term Goals - 05/08/21 0959       PT SHORT  TERM GOAL #1   Title Patient will be independent in home exercise program to improve strength/mobility for better functional independence with ADLs.    Baseline 02/24/2021 Pt issued HEP; 5/19: HEP to be advanced, pt has been unable to perform HEP consistently d/t spouse surgery and son's wedding.    Time 6    Period Weeks    Status On-going    Target Date 06/17/21               PT Long Term Goals - 05/08/21 0959       PT LONG TERM GOAL #1   Title Patient will report a worst pain of 3/10  in last 7 days in her R shoulder to improve tolerance with ADLs and reduced symptoms with activities.    Baseline 02/24/2021: Pt reports worst pain as 10/10.; 5/18: 4/10 is worst pain    Time 12    Period Weeks    Status Partially Met    Target Date 07/29/21      PT LONG TERM GOAL #2   Title Patient will decrease Quick DASH scores by > 8 points demonstrating reduced self-reported upper extremity disability.    Baseline 02/24/2021: QuickDash Disability/symptom score 40, Work score 37.5; 5/18: Disability/symtpom 38.6, Work 43.75    Time 12    Period Weeks    Status On-going    Target Date 07/29/21      PT LONG TERM GOAL #3   Title Patient will increase FOTO score to equal to or greater than 60 to demonstrate statistically significant improvement in mobility and quality of life.    Baseline 02/24/2021: 55; 5/18: 60    Time 12    Period Weeks    Status Achieved      PT LONG TERM GOAL #4   Title Patient will increase BUE MMT scores by at least 1/2 point as to improve functional strength and endurance for overhead activities and ADLs such as driving and washing her hair.    Baseline 02/24/2021: L/R shoulder flexion 4/5, 3+/5; shoulder abduction 4/5 B; shoulder extension 4+5 B; shoulder IR/ER both grossly 4/5 B; elbow flexion/extension 5/5 for both B; wrist flexion/extension 5/5 for both B; 5/18: L shoulder is grossly 4+/5.  R  shoulder flexion 3+/5; R shoulder abduction 4-/5; R shoulder IR/ER both 4-/5 (B);  R elbow flexion 4/5, elbow ext. 5/5 B, shoulder ext. 5/5 B    Time 12    Period Weeks    Status On-going    Target Date 07/29/21                   Plan - 06/02/21 1246     Clinical Impression Statement Patient has diffuse tenderness throughout bicep and anterior right shoulder region. Combination of trigger point dry needling and manual decrease pain and tenderness by end of session. She continues to progress with functional stabilization and strengthening interventions. The pt will benefit from further skilled PT to improve pain, UE ROM and strength to increase QOL.    Personal Factors and Comorbidities Comorbidity 1;Comorbidity 2;Comorbidity 3+;Past/Current Experience;Social Background;Time since onset of injury/illness/exacerbation;Transportation;Fitness;Other    Comorbidities Pertinent: recent hx of COVID19, HTN, hyperlipidemia, pre-diabetes, AFIB, CHF, depression, anxiety, OA, atypical migraine, asthma.    Examination-Activity Limitations Bathing;Bed Mobility;Hygiene/Grooming;Lift;Caring for Others;Reach Overhead;Carry;Dressing;Sleep;Other    Examination-Participation Restrictions Laundry;Cleaning;Driving;Community Activity;Other;Yard Work;Shop;Meal Prep;Volunteer    Stability/Clinical Decision Making Stable/Uncomplicated    Rehab Potential Good    PT Frequency 2x / week    PT Duration 12 weeks    PT Treatment/Interventions ADLs/Self Care Home Management;Biofeedback;Cryotherapy;Electrical Stimulation;Moist Heat;Traction;Ultrasound;DME Instruction;Functional mobility training;Therapeutic activities;Therapeutic exercise;Neuromuscular re-education;Patient/family education;Orthotic Fit/Training;Manual techniques;Scar mobilization;Passive range of motion;Dry needling;Taping;Energy conservation;Joint Manipulations;Spinal Manipulations;Gait training    PT Next Visit Plan Progress shoulder strengthening exercises; cervical stretches within pain tolerance, shoulder stability exercises/closed  kinetic chain, closed kinetic chain push-up plus, continue plan as previously indicated    PT Home Exercise Plan Issued HEP with handout: RTB rows, RTB shoulder ER pull-aparts, and upper trap stretch; shoulder perturbation exercises, ROM for shoulder IR within pain-free range, mobilizations/distraction to R shoulder, continue training correct lifting mechanics in order to reduce strain on RUE    Consulted and Agree with Plan of Care Patient             Patient will benefit from skilled therapeutic intervention in order to improve the following deficits and impairments:  Increased fascial restricitons, Improper body mechanics, Pain, Decreased mobility, Increased muscle spasms, Postural dysfunction, Decreased activity tolerance, Decreased endurance, Decreased range of motion, Decreased strength, Hypomobility, Impaired UE functional use, Impaired flexibility, Decreased coordination  Visit Diagnosis: Muscle weakness (generalized)  Chronic right shoulder pain     Problem List Patient Active Problem List   Diagnosis Date Noted   Abdominal pain, right lateral 10/21/2020   Acute bronchitis with bronchospasm 05/28/2020   Diarrhea 04/11/2020   Epigastric pain 02/19/2020   Dark stools 02/19/2020   PAF (paroxysmal atrial fibrillation) ([place]) 02/05/2019   Shortness of breath 01/27/2019   Irregular surface of cornea 12/25/2018   HPV (human papilloma virus) infection 12/01/2016   Screening mammogram, encounter for 11/29/2016   Estrogen deficiency 11/29/2016   Encounter for routine gynecological examination 11/29/2016   Grade III hemorrhoids 10/06/2016   Tachycardia 08/14/2016   Hemorrhoids 08/13/2016   Atypical migraine 08/03/2016   CAD (coronary artery disease)    AAA (abdominal aortic aneurysm) (HCC)    Chronic combined systolic (congestive) and diastolic (congestive) heart failure ([place])    Prediabetes 02/11/2016   Generalized anxiety disorder 12/08/2015   Panic attacks 07/28/2015    Sensorineural hearing loss 03/27/2013   Vitamin D deficiency 07/03/2012   H/O aortic dissection 05/12/2012   Class 2 obesity due to excess calories with body mass index (BMI) of 35.0 to 35.9 in  adult 11/21/2011   Obesity (BMI 30.0-34.9) 11/21/2011   Routine general medical examination at a health care facility 09/20/2011   S/P AVR (aortic valve replacement) 01/07/2011   CORONARY ARTERY BYPASS GRAFT, HX OF 01/07/2011   Arthritis 12/20/2010   Back pain 12/15/2010   Dissection of thoracic aorta ([place]) 12/11/2010   H/O dissecting abdominal aortic aneurysm repair 08/24/2010   IRRITABLE BOWEL SYNDROME 09/09/2009   Obstructive sleep apnea 08/04/2009   External hemorrhoid 06/26/2009   Osteoporosis 01/09/2009   Hyperlipidemia 07/26/2007   Depression with anxiety 07/26/2007   Essential hypertension 07/26/2007   Allergic rhinitis 07/26/2007   Asthma 07/26/2007   GERD 07/26/2007   Osteoarthritis 07/26/2007   Ocular migraine 07/26/2007   Mild intermittent asthma without complication 94/83/4758   Janna Arch, PT, DPT   06/02/2021, 12:47 PM  [place] MAIN [facility] SERVICES [address] [place], [place], [address] Phone: [phone]   Fax:  [phone]  Name: ASHLEY BULTEMA MRN: [id number] Date of Birth: 1956/08/28

## 2021-06-03 ENCOUNTER — Ambulatory Visit: Payer: Medicare HMO

## 2021-06-03 DIAGNOSIS — G8929 Other chronic pain: Secondary | ICD-10-CM | POA: Diagnosis not present

## 2021-06-03 DIAGNOSIS — R2689 Other abnormalities of gait and mobility: Secondary | ICD-10-CM

## 2021-06-03 DIAGNOSIS — M542 Cervicalgia: Secondary | ICD-10-CM | POA: Diagnosis not present

## 2021-06-03 DIAGNOSIS — M25511 Pain in right shoulder: Secondary | ICD-10-CM | POA: Diagnosis not present

## 2021-06-03 DIAGNOSIS — R293 Abnormal posture: Secondary | ICD-10-CM | POA: Diagnosis not present

## 2021-06-03 DIAGNOSIS — M6281 Muscle weakness (generalized): Secondary | ICD-10-CM

## 2021-06-03 DIAGNOSIS — R278 Other lack of coordination: Secondary | ICD-10-CM | POA: Diagnosis not present

## 2021-06-03 NOTE — Therapy (Signed)
[place] MAIN [facility] SERVICES [address] [place], [place], [address] Phone: [phone]   Fax:  [phone]  Physical Therapy Treatment  Patient Details  Name: Rachel Torres MRN: [id number] Date of Birth: 1956-06-01 Referring Provider (PT): Gregor Hams, MD   Encounter Date: 06/03/2021   PT End of Session - 06/03/21 1111     Visit Number 15    Number of Visits 41    Date for PT Re-Evaluation 07/29/21    Authorization Type Eval performed 01/24/972 (recert for 04/22/2991); new recert for 04/14/8340    PT Start Time 1114    PT Stop Time 1159    PT Time Calculation (min) 45 min    Activity Tolerance Patient tolerated treatment well;No increased pain    Behavior During Therapy WFL for tasks assessed/performed             Past Medical History:  Diagnosis Date   AAA (abdominal aortic aneurysm) ([place])    a. Chronic w/o evidence of aneurysmal dil on CTA 01/2016.   Alcohol abuse    Allergy    Anemia    Anxiety    CAD (coronary artery disease)    a. 08/2010 s/p CABG x 1 (VG->RCA) @ time of Ao dissection repair; b. 01/2016 Lexiscan MV: mid antsept/apical defect w/ ? peri-infarct ischemia-->likely attenuation-->Med Rx.   Chewing difficulty    Chronic combined systolic (congestive) and diastolic (congestive) heart failure ([place])    a. 2012 EF 30-35%; b. 04/2015 EF 40-45%; c. 01/2016 Echo: Ef 55-65%, nl AoV bioprosthesis, nl RV, nl PASP.   Constipation    Depression    Edema of both lower extremities    Gallbladder sludge    GERD (gastroesophageal reflux disease)    Heart failure (HCC)    Heart valve problem    History of Bicuspid Aortic Valve    a. 08/2010 s/p AVR @ time of Ao dissection repair; b. 01/2016 Echo: Ef 55-65%, nl AoV bioprosthesis, nl RV, nl PASP.   Hx of repair of dissecting thoracic aortic aneurysm, [place] type A    a. 08/2010 s/p repair with AVR and VG->RCA; b. 01/2016 CTA: stable appearance of Asc Thoracic Aortic graft.  Opacification of flase lumen of chronic abd Ao dissection w/ retrograde flow through lumbar arteries. No aneurysmal dil of flase lumen.   Hyperlipidemia    Hypertensive heart disease    IBS (irritable bowel syndrome)    Internal hemorrhoids    Kidney problem    Marfan syndrome    pt denies   Migraine    Obstructive sleep apnea    Osteoarthritis    Osteoporosis    Palpitations    Pneumonia    RA (rheumatoid arthritis) ([place])    a. Followed by Dr. [name]    Past Surgical History:  Procedure Laterality Date   ABDOMINAL AORTIC ANEURYSM REPAIR     CARDIAC CATHETERIZATION  2001   [place]     ESOPHAGOGASTRODUODENOSCOPY (EGD) WITH PROPOFOL N/A 05/08/2020   Procedure: ESOPHAGOGASTRODUODENOSCOPY (EGD) WITH PROPOFOL;  Surgeon: Virgel Manifold, MD;  Location: [facility] ENDOSCOPY;  Service: Endoscopy;  Laterality: N/A;   FRACTURE SURGERY Right    shoulder replacement   HEMORRHOID BANDING     ORIF DISTAL RADIUS FRACTURE Left    UPPER GASTROINTESTINAL ENDOSCOPY      There were no vitals filed for this visit.   Subjective Assessment - 06/03/21 1211  Subjective Patient reports she was sore after yesterday, went home and picked up heavy boxes. Reports pain went to 6/10 and now is at a 4/10    Pertinent History Pt is a 65 y/o female presenting to therapy due to R shoulder pain. Pt had R shoulder replacement 12-13 years ago.  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 She says shoulder pain started this past January as she was helping lift her husband and his assistive devices. She reports she has since stopped lifting him and his walker as they have caregivers who come to the home for 16 hours/week and can help. Pt is most concerned that her shoulder hurts when she drives and washes her hair. Her shoulder does not hurt constantly, but she states she has pain "the more I use it." She feels a catch  and stabbing sensation in R anterior shoulder that can travel into bicep and that sometimes the pain is a burning sensation. She sometimes has pain in L shoulder.  She rates her worst R shoulder pain as 10/10, best pain is 0/10. Her R shoulder pain is currently 1.5/10. She reports that rest, ice and naproxen help with pain relief. She denies redness/swelling/temp difference and N/T in UEs.  Patient has been seen by other therapists in this clinic previously for cervicalgia and back related injuries/pain. Pt PMH includes COVID19, upper GI stomach issues, aorta type A dissection (2011) with aortic valve replacement, bypass of R coronary artery, HTN, hyperlipidemia, pre-diabetes, AFIB, CHF, depression, anxiety, OA, GERD, atypical migraine, asthma, and allergic rhinitis.    Limitations Lifting;House hold activities;Other (comment)    How long can you sit comfortably? Not affected    How long can you stand comfortably? Not affected    How long can you walk comfortably? Pt reports she has pain only if she is carrying something    Diagnostic tests DG R shoulder 02/09/2021 per chart "Impression: Possible hardware loosening post total shoulder arthroplasty. No  evidence of acute fracture or dislocation"    Patient Stated Goals Be able to drive without pain    Currently in Pain? Yes    Pain Score 4     Pain Location Shoulder    Pain Orientation Right;Anterior    Pain Descriptors / Indicators Aching    Pain Type Chronic pain    Pain Onset More than a month ago    Pain Frequency Intermittent              Patient reports she was sore after yesterday, went home and picked up heavy boxes. Reports pain went to 6/10 and now is at a 4/10     [employer]Ex   Supine: PVC pipe overhead raise 10x RTB ER 15x; cues for slight increased extension of bicep for decreased demand on bicep  RTB IR 15x ; elbow on towel PVC pipe chest press 12x cue for pain free range of motion  Seated:  RTB row 15x  Scapular  retraction with chin tuck 15x Upper trap stretch 30 seconds each side  Standing:  Standing wall posture 10x 3 second hold with scapular retraction and chin tuck Standing wall abduction 10x; very fatiguing to patient PVC pipe overhead raise 10x    Manual: Distraction RUE 30 seconds 3 trials  AP right shoulder mobilization 4x 15 second grade II mobilizations Inferior right shoulder mobilization 4x 15 second grade II mobilizations. Cross body adduction with distraction x 10 with 5 second holds Scapular  retraction and depression with PT overpressure x4 minutes    Trigger Point Dry Needling (TDN), unbilled Education performed with patient regarding potential benefit of TDN. Reviewed precautions and risks with patient. Reviewed special precautions/risks over lung fields which include pneumothorax. Reviewed signs and symptoms of pneumothorax and advised pt to go to ER immediately if these symptoms develop advise them of dry needling treatment. Extensive time spent with pt to ensure full understanding of TDN risks. Pt provided verbal consent to treatment. TDN performed to  with 0.25 x 40 single needle placements with local twitch response (LTR). Pistoning technique utilized. Improved pain-free motion following intervention. Body part: R tricep, anterior and middle delt, R upper trap. X4 minutes       Pt educated throughout session about proper posture and technique with exercises. Improved exercise technique, movement at target joints, use of target muscles after min to mod verbal, visual, tactile cues  Patient tolerated progressive strengthening, range of motion, and postural interventions. She is highly motivated throughout session. Multiple trigger points throughout upper trap released. Patient is challenged with maintaining correct postural alignment. The pt will benefit from further skilled PT to improve pain, UE ROM and strength to increase QOL.                     PT Education  - 06/03/21 1111     Education Details exercise technique, manual, body mechanics    Person(s) Educated Patient    Methods Explanation;Demonstration;Tactile cues;Verbal cues    Comprehension Verbalized understanding;Returned demonstration;Verbal cues required;Tactile cues required              PT Short Term Goals - 05/08/21 0959       PT SHORT TERM GOAL #1   Title Patient will be independent in home exercise program to improve strength/mobility for better functional independence with ADLs.    Baseline 02/24/2021 Pt issued HEP; 5/19: HEP to be advanced, pt has been unable to perform HEP consistently d/t spouse surgery and son's wedding.    Time 6    Period Weeks    Status On-going    Target Date 06/17/21               PT Long Term Goals - 05/08/21 0959       PT LONG TERM GOAL #1   Title Patient will report a worst pain of 3/10  in last 7 days in her R shoulder to improve tolerance with ADLs and reduced symptoms with activities.    Baseline 02/24/2021: Pt reports worst pain as 10/10.; 5/18: 4/10 is worst pain    Time 12    Period Weeks    Status Partially Met    Target Date 07/29/21      PT LONG TERM GOAL #2   Title Patient will decrease Quick DASH scores by > 8 points demonstrating reduced self-reported upper extremity disability.    Baseline 02/24/2021: QuickDash Disability/symptom score 40, Work score 37.5; 5/18: Disability/symtpom 38.6, Work 43.75    Time 12    Period Weeks    Status On-going    Target Date 07/29/21      PT LONG TERM GOAL #3   Title Patient will increase FOTO score to equal to or greater than 60 to demonstrate statistically significant improvement in mobility and quality of life.    Baseline 02/24/2021: 55; 5/18: 60    Time 12    Period Weeks    Status Achieved      PT LONG TERM  GOAL #4   Title Patient will increase BUE MMT scores by at least 1/2 point as to improve functional strength and endurance for overhead activities and ADLs such as driving  and washing her hair.    Baseline 02/24/2021: L/R shoulder flexion 4/5, 3+/5; shoulder abduction 4/5 B; shoulder extension 4+5 B; shoulder IR/ER both grossly 4/5 B; elbow flexion/extension 5/5 for both B; wrist flexion/extension 5/5 for both B; 5/18: L shoulder is grossly 4+/5.  R shoulder flexion 3+/5; R shoulder abduction 4-/5; R shoulder IR/ER both 4-/5 (B); R elbow flexion 4/5, elbow ext. 5/5 B, shoulder ext. 5/5 B    Time 12    Period Weeks    Status On-going    Target Date 07/29/21                   Plan - 06/03/21 1217     Clinical Impression Statement Patient tolerated progressive strengthening, range of motion, and postural interventions. She is highly motivated throughout session. Multiple trigger points throughout upper trap released. Patient is challenged with maintaining correct postural alignment. The pt will benefit from further skilled PT to improve pain, UE ROM and strength to increase QOL.    Personal Factors and Comorbidities Comorbidity 1;Comorbidity 2;Comorbidity 3+;Past/Current Experience;Social Background;Time since onset of injury/illness/exacerbation;Transportation;Fitness;Other    Comorbidities Pertinent: recent hx of COVID19, HTN, hyperlipidemia, pre-diabetes, AFIB, CHF, depression, anxiety, OA, atypical migraine, asthma.    Examination-Activity Limitations Bathing;Bed Mobility;Hygiene/Grooming;Lift;Caring for Others;Reach Overhead;Carry;Dressing;Sleep;Other    Examination-Participation Restrictions Laundry;Cleaning;Driving;Community Activity;Other;Yard Work;Shop;Meal Prep;Volunteer    Stability/Clinical Decision Making Stable/Uncomplicated    Rehab Potential Good    PT Frequency 2x / week    PT Duration 12 weeks    PT Treatment/Interventions ADLs/Self Care Home Management;Biofeedback;Cryotherapy;Electrical Stimulation;Moist Heat;Traction;Ultrasound;DME Instruction;Functional mobility training;Therapeutic activities;Therapeutic exercise;Neuromuscular  re-education;Patient/family education;Orthotic Fit/Training;Manual techniques;Scar mobilization;Passive range of motion;Dry needling;Taping;Energy conservation;Joint Manipulations;Spinal Manipulations;Gait training    PT Next Visit Plan Progress shoulder strengthening exercises; cervical stretches within pain tolerance, shoulder stability exercises/closed kinetic chain, closed kinetic chain push-up plus, continue plan as previously indicated    PT Home Exercise Plan Issued HEP with handout: RTB rows, RTB shoulder ER pull-aparts, and upper trap stretch; shoulder perturbation exercises, ROM for shoulder IR within pain-free range, mobilizations/distraction to R shoulder, continue training correct lifting mechanics in order to reduce strain on RUE    Consulted and Agree with Plan of Care Patient             Patient will benefit from skilled therapeutic intervention in order to improve the following deficits and impairments:  Increased fascial restricitons, Improper body mechanics, Pain, Decreased mobility, Increased muscle spasms, Postural dysfunction, Decreased activity tolerance, Decreased endurance, Decreased range of motion, Decreased strength, Hypomobility, Impaired UE functional use, Impaired flexibility, Decreased coordination  Visit Diagnosis: Muscle weakness (generalized)  Chronic right shoulder pain  Other abnormalities of gait and mobility     Problem List Patient Active Problem List   Diagnosis Date Noted   Abdominal pain, right lateral 10/21/2020   Acute bronchitis with bronchospasm 05/28/2020   Diarrhea 04/11/2020   Epigastric pain 02/19/2020   Dark stools 02/19/2020   PAF (paroxysmal atrial fibrillation) ([place]) 02/05/2019   Shortness of breath 01/27/2019   Irregular surface of cornea 12/25/2018   HPV (human papilloma virus) infection 12/01/2016   Screening mammogram, encounter for 11/29/2016   Estrogen deficiency 11/29/2016   Encounter for routine gynecological  examination 11/29/2016   Grade III hemorrhoids 10/06/2016   Tachycardia 08/14/2016   Hemorrhoids 08/13/2016   Atypical migraine 08/03/2016  CAD (coronary artery disease)    AAA (abdominal aortic aneurysm) (HCC)    Chronic combined systolic (congestive) and diastolic (congestive) heart failure (HCC)    Prediabetes 02/11/2016   Generalized anxiety disorder 12/08/2015   Panic attacks 07/28/2015   Sensorineural hearing loss 03/27/2013   Vitamin D deficiency 07/03/2012   H/O aortic dissection 05/12/2012   Class 2 obesity due to excess calories with body mass index (BMI) of 35.0 to 35.9 in adult 11/21/2011   Obesity (BMI 30.0-34.9) 11/21/2011   Routine general medical examination at a health care facility 09/20/2011   S/P AVR (aortic valve replacement) 01/07/2011   CORONARY ARTERY BYPASS GRAFT, HX OF 01/07/2011   Arthritis 12/20/2010   Back pain 12/15/2010   Dissection of thoracic aorta ([place]) 12/11/2010   H/O dissecting abdominal aortic aneurysm repair 08/24/2010   IRRITABLE BOWEL SYNDROME 09/09/2009   Obstructive sleep apnea 08/04/2009   External hemorrhoid 06/26/2009   Osteoporosis 01/09/2009   Hyperlipidemia 07/26/2007   Depression with anxiety 07/26/2007   Essential hypertension 07/26/2007   Allergic rhinitis 07/26/2007   Asthma 07/26/2007   GERD 07/26/2007   Osteoarthritis 07/26/2007   Ocular migraine 07/26/2007   Mild intermittent asthma without complication 63/78/5885    Janna Arch, PT, DPT  06/03/2021, 12:18 PM  [place] MAIN [facility] SERVICES [address] [place], [place], [address] Phone: [phone]   Fax:  [phone]  Name: Rachel Torres MRN: [id number] Date of Birth: Oct 15, 1956

## 2021-06-05 ENCOUNTER — Other Ambulatory Visit: Payer: Self-pay

## 2021-06-05 ENCOUNTER — Encounter: Payer: Self-pay | Admitting: Family Medicine

## 2021-06-05 ENCOUNTER — Ambulatory Visit (INDEPENDENT_AMBULATORY_CARE_PROVIDER_SITE_OTHER): Payer: Medicare HMO | Admitting: Family Medicine

## 2021-06-05 VITALS — BP 118/74 | HR 81 | Temp 97.1°F | Ht 65.0 in | Wt 180.0 lb

## 2021-06-05 DIAGNOSIS — G43009 Migraine without aura, not intractable, without status migrainosus: Secondary | ICD-10-CM | POA: Diagnosis not present

## 2021-06-05 DIAGNOSIS — I10 Essential (primary) hypertension: Secondary | ICD-10-CM | POA: Diagnosis not present

## 2021-06-05 DIAGNOSIS — R7303 Prediabetes: Secondary | ICD-10-CM

## 2021-06-05 DIAGNOSIS — F418 Other specified anxiety disorders: Secondary | ICD-10-CM

## 2021-06-05 DIAGNOSIS — E669 Obesity, unspecified: Secondary | ICD-10-CM | POA: Diagnosis not present

## 2021-06-05 DIAGNOSIS — Z1231 Encounter for screening mammogram for malignant neoplasm of breast: Secondary | ICD-10-CM | POA: Insufficient documentation

## 2021-06-05 MED ORDER — PROMETHAZINE HCL 25 MG PO TABS: 12.5000 mg | ORAL_TABLET | Freq: Three times a day (TID) | ORAL | 1 refills | Status: DC | PRN

## 2021-06-05 MED ORDER — ALPRAZOLAM 0.5 MG PO TABS: 0.5000 mg | ORAL_TABLET | Freq: Every day | ORAL | 0 refills | Status: DC | PRN

## 2021-06-05 MED ORDER — CYCLOBENZAPRINE HCL 10 MG PO TABS: 5.0000 mg | ORAL_TABLET | Freq: Three times a day (TID) | ORAL | 1 refills | Status: DC | PRN

## 2021-06-05 NOTE — Progress Notes (Signed)
Subjective:    Patient ID: Rachel Torres, female    DOB: [birth year], 65 y.o.   MRN: [id number]  This visit occurred during the SARS-CoV-2 public health emergency.  Safety protocols were in place, including screening questions prior to the visit, additional usage of staff PPE, and extensive cleaning of exam room while observing appropriate contact time as indicated for disinfecting solutions.   HPI Pt presents for f/u of multiple medical problems incl migraine and HTN  Wt Readings from Last 3 Encounters:  06/05/21 180 lb (81.6 kg)  05/20/21 179 lb (81.2 kg)  03/31/21 182 lb (82.6 kg)   29.95 kg/m [facility]  Lost 34 lb so far  Taking ozempic  Feeling better / thrilled with it   Going to PT for shoulder Working on posture   Migraines are not as bad as they have been  Less often uses phenergen and flexeril prn    HTN bp is stable today  No cp or palpitations or headaches or edema  No side effects to medicines  BP Readings from Last 3 Encounters:  06/05/21 118/74  05/20/21 113/71  03/31/21 106/60     Pulse Readings from Last 3 Encounters:  06/05/21 81  05/20/21 79  03/31/21 94   Mood  Effexor xr 150 mg  Xanax as needed - some nights when she can't sleep  Opal Sidles retired and in the middle of changing therapist  Set up with dr Michail Sermon   Caring for husband  Stress  Doing a lot    Lab Results  Component Value Date   CREATININE 0.91 02/26/2021   BUN 16 02/26/2021   NA 137 02/26/2021   K 4.7 02/26/2021   CL 100 02/26/2021   CO2 21 02/26/2021   Lab Results  Component Value Date   CHOL 219 (H) 02/26/2021   HDL 64 02/26/2021   LDLCALC 141 (H) 02/26/2021   LDLDIRECT 189.4 10/13/2011   TRIG 77 02/26/2021   CHOLHDL 3.4 02/26/2021   Lab Results  Component Value Date   HGBA1C 5.5 02/26/2021   Patient Active Problem List   Diagnosis Date Noted   Encounter for screening mammogram for breast cancer 06/05/2021   Abdominal pain,  right lateral 10/21/2020   Diarrhea 04/11/2020   Epigastric pain 02/19/2020   Dark stools 02/19/2020   PAF (paroxysmal atrial fibrillation) ([place]) 02/05/2019   Shortness of breath 01/27/2019   Irregular surface of cornea 12/25/2018   HPV (human papilloma virus) infection 12/01/2016   Screening mammogram, encounter for 11/29/2016   Estrogen deficiency 11/29/2016   Encounter for routine gynecological examination 11/29/2016   Grade III hemorrhoids 10/06/2016   Tachycardia 08/14/2016   Hemorrhoids 08/13/2016   Atypical migraine 08/03/2016   CAD (coronary artery disease)    AAA (abdominal aortic aneurysm) (HCC)    Chronic combined systolic (congestive) and diastolic (congestive) heart failure ([place])    Prediabetes 02/11/2016   Generalized anxiety disorder 12/08/2015   Panic attacks 07/28/2015   Sensorineural hearing loss 03/27/2013   Vitamin D deficiency 07/03/2012   H/O aortic dissection 05/12/2012   Obesity (BMI 30.0-34.9) 11/21/2011   Routine general medical examination at a health care facility 09/20/2011   S/P AVR (aortic valve replacement) 01/07/2011   CORONARY ARTERY BYPASS GRAFT, HX OF 01/07/2011   Arthritis 12/20/2010   Back pain 12/15/2010   Dissection of thoracic aorta ([place]) 12/11/2010   H/O dissecting abdominal aortic aneurysm repair 08/24/2010   IRRITABLE BOWEL SYNDROME 09/09/2009   Obstructive sleep  apnea 08/04/2009   External hemorrhoid 06/26/2009   Osteoporosis 01/09/2009   Hyperlipidemia 07/26/2007   Depression with anxiety 07/26/2007   Essential hypertension 07/26/2007   Allergic rhinitis 07/26/2007   Asthma 07/26/2007   GERD 07/26/2007   Osteoarthritis 07/26/2007   Ocular migraine 07/26/2007   Mild intermittent asthma without complication 16/09/9603   Past Medical History:  Diagnosis Date   AAA (abdominal aortic aneurysm) ([place])    a. Chronic w/o evidence of aneurysmal dil on CTA 01/2016.   Alcohol abuse    Allergy    Anemia    Anxiety    CAD (coronary  artery disease)    a. 08/2010 s/p CABG x 1 (VG->RCA) @ time of Ao dissection repair; b. 01/2016 Lexiscan MV: mid antsept/apical defect w/ ? peri-infarct ischemia-->likely attenuation-->Med Rx.   Chewing difficulty    Chronic combined systolic (congestive) and diastolic (congestive) heart failure ([place])    a. 2012 EF 30-35%; b. 04/2015 EF 40-45%; c. 01/2016 Echo: Ef 55-65%, nl AoV bioprosthesis, nl RV, nl PASP.   Constipation    Depression    Edema of both lower extremities    Gallbladder sludge    GERD (gastroesophageal reflux disease)    Heart failure (HCC)    Heart valve problem    History of Bicuspid Aortic Valve    a. 08/2010 s/p AVR @ time of Ao dissection repair; b. 01/2016 Echo: Ef 55-65%, nl AoV bioprosthesis, nl RV, nl PASP.   Hx of repair of dissecting thoracic aortic aneurysm, [place] type A    a. 08/2010 s/p repair with AVR and VG->RCA; b. 01/2016 CTA: stable appearance of Asc Thoracic Aortic graft. Opacification of flase lumen of chronic abd Ao dissection w/ retrograde flow through lumbar arteries. No aneurysmal dil of flase lumen.   Hyperlipidemia    Hypertensive heart disease    IBS (irritable bowel syndrome)    Internal hemorrhoids    Kidney problem    Marfan syndrome    pt denies   Migraine    Obstructive sleep apnea    Osteoarthritis    Osteoporosis    Palpitations    Pneumonia    RA (rheumatoid arthritis) ([place])    a. Followed by Dr. [name]   Past Surgical History:  Procedure Laterality Date   ABDOMINAL AORTIC ANEURYSM REPAIR     CARDIAC CATHETERIZATION  2001   [place]     ESOPHAGOGASTRODUODENOSCOPY (EGD) WITH PROPOFOL N/A 05/08/2020   Procedure: ESOPHAGOGASTRODUODENOSCOPY (EGD) WITH PROPOFOL;  Surgeon: Virgel Manifold, MD;  Location: [facility] ENDOSCOPY;  Service: Endoscopy;  Laterality: N/A;   FRACTURE SURGERY Right    shoulder replacement   HEMORRHOID BANDING     ORIF DISTAL RADIUS FRACTURE Left    UPPER  GASTROINTESTINAL ENDOSCOPY     Social History   Tobacco Use   Smoking status: Former    Packs/day: 0.75    Years: 1.50    Pack years: 1.13    Types: Cigarettes    Quit date: 12/20/1978    Years since quitting: 42.4   Smokeless tobacco: Never  Vaping Use   Vaping Use: Never used  Substance Use Topics   Alcohol use: Yes    Alcohol/week: 0.0 standard drinks    Comment: 2-3 glasses of red wine per week   Drug use: No   Family History  Problem Relation Age of Onset   Hypertension Mother    Depression Mother    Osteoporosis  Mother    Stroke Mother    Irritable bowel syndrome Mother    Asthma Mother    Heart disease Mother    Thyroid disease Mother    Anxiety disorder Mother    Arthritis Sister    Diabetes Sister    Heart disease Sister    Asthma Sister    Migraines Sister    Nephrolithiasis Sister    Osteoporosis Sister    Arthritis Sister    Asthma Sister    Migraines Sister    Other Father 58   Stroke Maternal Grandmother    Colon cancer Maternal Grandmother    Lymphoma Maternal Grandmother    Migraines Maternal Grandmother    Aneurysm Paternal Grandmother        brain   Diabetes Paternal Grandmother    Arthritis Paternal Grandmother    Arthritis Paternal Grandfather    Diabetes Paternal Grandfather    Other Brother        prediabeties   Asthma Brother    Migraines Brother    Lung cancer Maternal Grandfather    Melanoma Maternal Grandfather    Allergies  Allergen Reactions   Fosamax [Alendronate Sodium]     GI upset    Ginzing [Ginseng] Other (See Comments)    Hyper and jitery    Hydrocod Polst-Cpm Polst Er Itching   Hydrocodone-Acetaminophen Itching   Imitrex [Sumatriptan]     Contraindicated w/ heart condition   Oxycodone Itching   Peanut-Containing Drug Products Other (See Comments)    Reaction:  Migraines    Tramadol Other (See Comments)    itching   Hydrocodone-Guaifenesin Itching   Prednisone Rash   Tape Itching   Current Outpatient  Medications on File Prior to Visit  Medication Sig Dispense Refill   albuterol (PROVENTIL HFA;VENTOLIN HFA) 108 (90 Base) MCG/ACT inhaler Inhale 1-2 puffs into the lungs every 6 (six) hours as needed for wheezing or shortness of breath. 1 Inhaler 0   aspirin EC 81 MG tablet Take 81 mg by mouth at bedtime.     atorvastatin (LIPITOR) 20 MG tablet TAKE 1 TABLET EVERY DAY 90 tablet 2   carvedilol (COREG) 6.25 MG tablet TAKE 1 TABLET TWICE DAILY WITH A MEAL 180 tablet 2   Cholecalciferol (VITAMIN D3) 5000 units CAPS Take 1 capsule by mouth daily.     furosemide (LASIX) 20 MG tablet TAKE 1 TABLET DAILY. MAY TAKE ADDITIONAL TAB IN AFTERNOON AS NEEDED FOR SWELLING, SHORTNESS OF BREATH, OR WEIGHT GAIN. 180 tablet 0   Insulin Pen Needle (BD PEN NEEDLE NANO U/F) 32G X 4 MM MISC Use Nano needle with Ozempic 100 each 0   Krill Oil (OMEGA-3) 500 MG CAPS Take 1 capsule by mouth daily.     lisinopril (ZESTRIL) 5 MG tablet TAKE 1/2 TABLET EVERY DAY 45 tablet 2   magnesium gluconate (MAGONATE) 500 MG tablet Take 500 mg by mouth at bedtime.     Misc Natural Products (TART CHERRY ADVANCED) CAPS Take 1 capsule by mouth daily.     naproxen sodium (ANAPROX) 220 MG tablet Take 440 mg by mouth 2 (two) times daily as needed (for headaches/pain).     nystatin (MYCOSTATIN/NYSTOP) powder Apply 1 application topically 2 (two) times daily. Apply to affected area twice a day for 14 days 60 g 0   omeprazole (PRILOSEC) 40 MG capsule Take 1 capsule (40 mg total) by mouth daily. 90 capsule 3   potassium chloride (KLOR-CON) 10 MEQ tablet TAKE 1 TABLET TWICE A DAY AS  NEEDED. TAKE ADDITIONAL TABLET WHEN INCREASING FUROSEMIDE 270 tablet 1   Semaglutide,0.25 or 0.5MG /DOS, (OZEMPIC, 0.25 OR 0.5 MG/DOSE,) 2 MG/1.5ML SOPN Inject 0.5 mg into the skin once a week. 1.5 mL 0   Simethicone (MYLANTA GAS PO) Take by mouth 3 times/day as needed-between meals & bedtime.     venlafaxine XR (EFFEXOR-XR) 150 MG 24 hr capsule TAKE 1 CAPSULE EVERY DAY  WITH BREAKFAST 90 capsule 1   No current facility-administered medications on file prior to visit.    Review of Systems  Constitutional:  Negative for activity change, appetite change, fatigue, fever and unexpected weight change.  HENT:  Negative for congestion, ear pain, rhinorrhea, sinus pressure and sore throat.   Eyes:  Negative for pain, redness and visual disturbance.  Respiratory:  Negative for cough, shortness of breath and wheezing.   Cardiovascular:  Negative for chest pain and palpitations.  Gastrointestinal:  Negative for abdominal pain, blood in stool, constipation and diarrhea.  Endocrine: Negative for polydipsia and polyuria.  Genitourinary:  Negative for dysuria, frequency and urgency.  Musculoskeletal:  Negative for arthralgias, back pain and myalgias.  Skin:  Negative for pallor and rash.  Allergic/Immunologic: Negative for environmental allergies.  Neurological:  Negative for dizziness, syncope and headaches.  Hematological:  Negative for adenopathy. Does not bruise/bleed easily.  Psychiatric/Behavioral:  Negative for decreased concentration and dysphoric mood. The patient is nervous/anxious.        Stressors       Objective:   Physical Exam Constitutional:      General: She is not in acute distress.    Appearance: Normal appearance. She is well-developed. She is obese.  HENT:     Head: Normocephalic and atraumatic.  Eyes:     Conjunctiva/sclera: Conjunctivae normal.     Pupils: Pupils are equal, round, and reactive to light.  Neck:     Thyroid: No thyromegaly.     Vascular: No carotid bruit or JVD.  Cardiovascular:     Rate and Rhythm: Normal rate and regular rhythm.     Heart sounds: Normal heart sounds.    No gallop.  Pulmonary:     Effort: Pulmonary effort is normal. No respiratory distress.     Breath sounds: Normal breath sounds. No wheezing or rales.  Abdominal:     General: Bowel sounds are normal. There is no distension or abdominal bruit.      Palpations: Abdomen is soft. There is no mass.     Tenderness: There is no abdominal tenderness.  Musculoskeletal:     Cervical back: Normal range of motion and neck supple.     Right lower leg: No edema.     Left lower leg: No edema.  Lymphadenopathy:     Cervical: No cervical adenopathy.  Skin:    General: Skin is warm and dry.     Coloration: Skin is not pale.     Findings: No rash.  Neurological:     Mental Status: She is alert.     Coordination: Coordination normal.     Deep Tendon Reflexes: Reflexes are normal and symmetric. Reflexes normal.  Psychiatric:        Mood and Affect: Mood normal.        Cognition and Memory: Cognition and memory normal.          Assessment & Plan:   Problem List Items Addressed This Visit       Cardiovascular and Mediastinum   Essential hypertension    bp in fair control at  this time  BP Readings from Last 1 Encounters:  06/05/21 118/74  No changes needed Most recent labs reviewed  Disc lifstyle change with low sodium diet and exercise  Plan to continue carvedilol6.25 mg bid and furosemide 20 mg daily Lisinopril 5 mg daily       Atypical migraine - Primary    Less frequent lately Phenergan and flexeril prn with caution of sedation        Relevant Medications   cyclobenzaprine (FLEXERIL) 10 MG tablet     Other   Depression with anxiety    Pt is in the midst of changing therapists  Continues effexor xr 150 mg daily  Xanax as needed  Reviewed stressors/ coping techniques/symptoms/ support sources/ tx options and side effects in detail today Cares for husband - some stress there        Relevant Medications   ALPRAZolam (XANAX) 0.5 MG tablet   Prediabetes    Lab Results  Component Value Date   HGBA1C 5.5 02/26/2021  disc imp of low glycemic diet and wt loss to prevent DM2        Obesity (BMI 30.0-34.9)    Significant wt loss working with [facility] and on ozempic Discussed how this problem  influences overall health and the risks it imposes  Reviewed plan for weight loss with lower calorie diet (via better food choices and also portion control or program like weight watchers) and exercise building up to or more than 30 minutes 5 days per week including some aerobic activity   Commended on over 30 lb off so far        Encounter for screening mammogram for breast cancer    Scheduled annual screening mammogram  Encouraged monthly self exams         Relevant Orders   MM 3D SCREEN BREAST BILATERAL

## 2021-06-05 NOTE — Patient Instructions (Signed)
If you are interested in the shingles vaccine series (Shingrix), call your insurance or pharmacy to check on coverage and location it must be given.  If affordable - you can schedule it here or at your pharmacy depending on coverage   Get your mammogram when you can  I ordered it   Take care of yourself  Great job with weight loss so far  Stay active

## 2021-06-07 NOTE — Assessment & Plan Note (Signed)
Pt is in the midst of changing therapists  Continues effexor xr 150 mg daily  Xanax as needed  Reviewed stressors/ coping techniques/symptoms/ support sources/ tx options and side effects in detail today Cares for husband - some stress there

## 2021-06-07 NOTE — Assessment & Plan Note (Signed)
bp in fair control at this time  BP Readings from Last 1 Encounters:  06/05/21 118/74   No changes needed Most recent labs reviewed  Disc lifstyle change with low sodium diet and exercise  Plan to continue carvedilol6.25 mg bid and furosemide 20 mg daily Lisinopril 5 mg daily

## 2021-06-07 NOTE — Assessment & Plan Note (Signed)
Scheduled annual screening mammogram  Encouraged monthly self exams

## 2021-06-07 NOTE — Assessment & Plan Note (Signed)
Lab Results  Component Value Date   HGBA1C 5.5 02/26/2021   disc imp of low glycemic diet and wt loss to prevent DM2

## 2021-06-07 NOTE — Assessment & Plan Note (Signed)
Significant wt loss working with [facility] and on ozempic Discussed how this problem influences overall health and the risks it imposes  Reviewed plan for weight loss with lower calorie diet (via better food choices and also portion control or program like weight watchers) and exercise building up to or more than 30 minutes 5 days per week including some aerobic activity   Commended on over 30 lb off so far

## 2021-06-07 NOTE — Assessment & Plan Note (Signed)
Less frequent lately Phenergan and flexeril prn with caution of sedation

## 2021-06-08 ENCOUNTER — Ambulatory Visit (INDEPENDENT_AMBULATORY_CARE_PROVIDER_SITE_OTHER): Payer: Medicare HMO

## 2021-06-08 ENCOUNTER — Other Ambulatory Visit: Payer: Self-pay

## 2021-06-08 ENCOUNTER — Ambulatory Visit: Payer: Medicare HMO

## 2021-06-08 DIAGNOSIS — M25511 Pain in right shoulder: Secondary | ICD-10-CM

## 2021-06-08 DIAGNOSIS — E7849 Other hyperlipidemia: Secondary | ICD-10-CM | POA: Diagnosis not present

## 2021-06-08 DIAGNOSIS — G8929 Other chronic pain: Secondary | ICD-10-CM | POA: Diagnosis not present

## 2021-06-08 DIAGNOSIS — M6281 Muscle weakness (generalized): Secondary | ICD-10-CM

## 2021-06-08 DIAGNOSIS — R278 Other lack of coordination: Secondary | ICD-10-CM | POA: Diagnosis not present

## 2021-06-08 DIAGNOSIS — R293 Abnormal posture: Secondary | ICD-10-CM | POA: Diagnosis not present

## 2021-06-08 DIAGNOSIS — I10 Essential (primary) hypertension: Secondary | ICD-10-CM | POA: Diagnosis not present

## 2021-06-08 DIAGNOSIS — R2689 Other abnormalities of gait and mobility: Secondary | ICD-10-CM | POA: Diagnosis not present

## 2021-06-08 DIAGNOSIS — M542 Cervicalgia: Secondary | ICD-10-CM | POA: Diagnosis not present

## 2021-06-08 NOTE — Progress Notes (Signed)
Chronic Care Management Pharmacy Note  06/08/2021 Name:  Rachel WHITSELL MRN:  [id number] DOB:  1956/09/23  Subjective: Rachel Torres is an 65 y.o. year old female who is a primary patient of Tower, Wynelle Fanny, MD.  The CCM team was consulted for assistance with disease management and care coordination needs.    Engaged with patient by telephone for follow up visit in response to provider referral for pharmacy case management and/or care coordination services. States she is doing well. She saw Dr. Glori Bickers recently and refilled meds for migraine and panic attacks. She has next physical July 17, 2021. She has lost 40 lbs over the past year with Ozempic/weight management.  Consent to Services:  The patient was given information about Chronic Care Management services, agreed to services, and gave verbal consent prior to initiation of services.  Please see initial visit note for detailed documentation.   Patient Care Team: Tower, Wynelle Fanny, MD as PCP - General (Family Medicine) Debbora Dus, [facility] as Pharmacist (Pharmacist) Starlyn Skeans, MD as Consulting Physician (Family Medicine)  Recent office visits: 06/05/21 - Tower - Continue current meds. Mammogram referral  10/21/20 - Tower - Diarrhea, fluids and watch for improvement, labs and urinalysis  Recent consult visits: 05/20/21 - Caren Leafy Ro, Weight Management - Increase Ozempic to 0.5 mg weekly 03/10/21 - Sports medicine, chronic right shoulder pain  02/26/21 - [name], Weight Management - Nyana notes Ozempic is helping her portion control. She denies nausea, vomiting, or diarrhea.Tiawanna is on Lipitor, and she is tolerating it well. She is due for labs. Lyndi is on Vit D OTC, and her last level was at goal. 02/09/21 - Sports medicine, chronic right shoulder pain - start Voltaren gel 4x daily PRN pain, Xray, referral for PT 02/02/21 - Caren Leafy Ro, Weight Management - Referral to sports medicine for shoulder pain, constipation -  trial miralax OTC 17 gm daily, Start Nystatin  12/30/20 - Caren Beasley, Weight Management - Start Ozempic 0.25 mg weekly  10/28/20 Dr. [name] changed Atorvastatin from daily to [facility], Wednesday and Friday.   Hospital visits: None in previous 6 months  Objective:  Lab Results  Component Value Date   CREATININE 0.91 02/26/2021   BUN 16 02/26/2021   GFR 76.34 10/21/2020   GFRNONAA 68 07/23/2020   GFRAA 78 07/23/2020   NA 137 02/26/2021   K 4.7 02/26/2021   CALCIUM 9.5 02/26/2021   CO2 21 02/26/2021   GLUCOSE 97 02/26/2021    Lab Results  Component Value Date/Time   HGBA1C 5.5 02/26/2021 01:55 PM   HGBA1C 5.7 (H) 10/29/2020 12:11 PM   GFR 76.34 10/21/2020 08:56 AM   GFR 62.57 02/19/2020 01:00 PM    Lab Results  Component Value Date   CHOL 219 (H) 02/26/2021   HDL 64 02/26/2021   LDLCALC 141 (H) 02/26/2021   LDLDIRECT 189.4 10/13/2011   TRIG 77 02/26/2021   CHOLHDL 3.4 02/26/2021   Hepatic Function Latest Ref Rng & Units 02/26/2021 10/21/2020 07/23/2020  Total Protein 6.0 - 8.5 g/dL 7.6 7.5 7.4  Albumin 3.8 - 4.8 g/dL 4.6 4.4 4.7  AST 0 - 40 IU/L 18 20 22   ALT 0 - 32 IU/L 18 22 20   Alk Phosphatase 44 - 121 IU/L 75 75 82  Total Bilirubin 0.0 - 1.2 mg/dL 0.5 0.6 0.3  Bilirubin, Direct 0.00 - 0.40 mg/dL - - -    Lab Results  Component Value Date/Time   TSH 1.040 05/05/2020 11:53 AM  TSH 0.76 06/07/2019 10:38 AM   FREET4 0.94 05/05/2020 11:53 AM   FREET4 0.81 07/12/2016 02:36 PM    CBC Latest Ref Rng & Units 10/21/2020 02/19/2020 06/07/2019  WBC 4.0 - 10.5 K/uL 6.0 5.8 4.8  Hemoglobin 12.0 - 15.0 g/dL 13.6 13.0 12.8  Hematocrit 36.0 - 46.0 % 40.3 38.5 37.4  Platelets 150.0 - 400.0 K/uL 288.0 284.0 248.0    Lab Results  Component Value Date/Time   VD25OH 62.2 02/26/2021 01:55 PM   VD25OH 59.5 10/29/2020 12:11 PM   VD25OH 69.53 06/07/2019 10:38 AM   VD25OH 73.13 06/26/2018 08:31 AM   Clinical ASCVD: Yes  The 10-year ASCVD risk score Mikey Bussing DC Jr., et al., 2013) is:  9.5%   Values used to calculate the score:     Age: 36 years     Sex: Female     Is Non-Hispanic African American: No     Diabetic: Yes     Tobacco smoker: No     Systolic Blood Pressure: 413 mmHg     Is BP treated: Yes     HDL Cholesterol: 64 mg/dL     Total Cholesterol: 219 mg/dL    Depression screen PHQ 2/9 05/05/2020  Decreased Interest 3  Down, Depressed, Hopeless 1  PHQ - 2 Score 4  Altered sleeping 2  Tired, decreased energy 3  Change in appetite 2  Feeling bad or failure about yourself  0  Trouble concentrating 0  Moving slowly or fidgety/restless 1  Suicidal thoughts 0  PHQ-9 Score 12  Difficult doing work/chores Somewhat difficult  Some recent data might be hidden    Social History   Tobacco Use  Smoking Status Former   Packs/day: 0.75   Years: 1.50   Pack years: 1.13   Types: Cigarettes   Quit date: 12/20/1978   Years since quitting: 42.4  Smokeless Tobacco Never   BP Readings from Last 3 Encounters:  06/05/21 118/74  05/20/21 113/71  03/31/21 106/60   Pulse Readings from Last 3 Encounters:  06/05/21 81  05/20/21 79  03/31/21 94   Wt Readings from Last 3 Encounters:  06/05/21 180 lb (81.6 kg)  05/20/21 179 lb (81.2 kg)  03/31/21 182 lb (82.6 kg)   BMI Readings from Last 3 Encounters:  06/05/21 29.95 kg/m  05/20/21 29.79 kg/m  03/31/21 30.29 kg/m    Assessment/Interventions: Review of patient past medical history, allergies, medications, health status, including review of consultants reports, laboratory and other test data, was performed as part of comprehensive evaluation and provision of chronic care management services.   SDOH:  (Social Determinants of Health) assessments and interventions performed: Yes  SDOH Interventions    Flowsheet Row Most Recent Value  SDOH Interventions   Financial Strain Interventions Intervention Not Indicated      SDOH Screenings   Alcohol Screen: Not on file  Depression (PHQ2-9): Not on file   Financial Resource Strain: Low Risk    Difficulty of Paying Living Expenses: Not very hard  Food Insecurity: Not on file  Housing: Not on file  Physical Activity: Not on file  Social Connections: Not on file  Stress: Not on file  Tobacco Use: Medium Risk   Smoking Tobacco Use: Former   Smokeless Tobacco Use: Never  Transportation Needs: Not on file    [place]  Allergies  Allergen Reactions   Fosamax [Alendronate Sodium]     GI upset    Ginzing [Ginseng] Other (See Comments)    Hyper and jitery  Hydrocod Polst-Cpm Polst Er Itching   Hydrocodone-Acetaminophen Itching   Imitrex [Sumatriptan]     Contraindicated w/ heart condition   Oxycodone Itching   Peanut-Containing Drug Products Other (See Comments)    Reaction:  Migraines    Tramadol Other (See Comments)    itching   Hydrocodone-Guaifenesin Itching   Prednisone Rash   Tape Itching    Medications Reviewed Today     Reviewed by Zollie Pee, PT (Physical Therapist) on 03/20/21 at [phone]  Med List Status: <None>   Medication Order Taking? Sig Documenting Provider Last Dose Status Informant  albuterol (PROVENTIL HFA;VENTOLIN HFA) 108 (90 Base) MCG/ACT inhaler [id number] No Inhale 1-2 puffs into the lungs every 6 (six) hours as needed for wheezing or shortness of breath. Pleas Koch, NP Taking Active   ALPRAZolam Duanne Moron) 0.5 MG tablet [id number] No Take 1 tablet (0.5 mg total) by mouth daily as needed for anxiety. Tower, Wynelle Fanny, MD Taking Active   aspirin EC 81 MG tablet [id number] No Take 81 mg by mouth at bedtime. [provider] Taking Active Self  atorvastatin (LIPITOR) 20 MG tablet [id number] No TAKE 1 TABLET EVERY DAY Gollan, Kathlene November, MD Taking Active   carvedilol (COREG) 6.25 MG tablet [id number] No TAKE 1 TABLET TWICE DAILY WITH A MEAL Gollan, Kathlene November, MD Taking Active   Cholecalciferol (VITAMIN D3) 5000 units CAPS [id number] No Take 1 capsule by mouth daily. [provider] Taking  Active   cyclobenzaprine (FLEXERIL) 10 MG tablet [id number] No Take 0.5-1 tablets (5-10 mg total) by mouth 3 (three) times daily as needed. For headache Tower, Wynelle Fanny, MD Taking Active   furosemide (LASIX) 20 MG tablet [id number] No Take 1 tablet (20 mg total) by mouth daily. Take 1 tablet (20 mg) daily, may take additional 1 tablet (20 mg) in the afternoon as needed for swelling, shortness of breath, or weight gain Minna Merritts, MD Taking Expired 10/28/20 2359   Krill Oil (OMEGA-3) 500 MG CAPS [id number] No Take 1 capsule by mouth daily. [provider] Taking Active   lisinopril (ZESTRIL) 5 MG tablet [id number] No TAKE 1/2 TABLET EVERY DAY Gollan, Kathlene November, MD Taking Active   magnesium gluconate (MAGONATE) 500 MG tablet [id number] No Take 500 mg by mouth at bedtime. [provider] Taking Active Self  Misc Natural Products (TART CHERRY ADVANCED) CAPS [id number] No Take 1 capsule by mouth daily. [provider] Taking Active   naproxen sodium (ANAPROX) 220 MG tablet [id number] No Take 440 mg by mouth 2 (two) times daily as needed (for headaches/pain). [provider] Taking Active Self           Med Note Minus Breeding Graylin Shiver   Tue Oct 28, 2020  3:09 PM)    nystatin (MYCOSTATIN/NYSTOP) powder [id number] No Apply 1 application topically 2 (two) times daily. Apply to affected area twice a day for 14 days [name] D, MD Taking Active   omeprazole (PRILOSEC) 40 MG capsule [id number] No Take 1 capsule (40 mg total) by mouth daily. Tower, Wynelle Fanny, MD Taking Active   potassium chloride (KLOR-CON) 10 MEQ tablet [id number] No Take 1 tablet (10 mEq total) by mouth 2 (two) times daily as needed. Take additional pill when increasing Furosemide. Minna Merritts, MD Taking Active   promethazine (PHENERGAN) 25 MG tablet [id number] No Take 0.5 tablets (12.5 mg total) by mouth every 8 (eight) hours as needed for nausea. Tower, Wynelle Fanny, MD Taking Active   Semaglutide,0.25 or  0.5MG/DOS, (OZEMPIC, 0.25 OR 0.5 MG/DOSE,) 2 MG/1.5ML SOPN [id number]  Inject 0.25 mg into the skin once a week. Abby Potash, PA-C  Active   Simethicone (MYLANTA GAS PO) [id number] No Take by mouth 3 times/day as needed-between meals & bedtime. [provider] Taking Active   venlafaxine XR (EFFEXOR-XR) 150 MG 24 hr capsule [id number] No TAKE 1 CAPSULE EVERY DAY WITH BREAKFAST Tower, Wynelle Fanny, MD Taking Active             Patient Active Problem List   Diagnosis Date Noted   Encounter for screening mammogram for breast cancer 06/05/2021   Abdominal pain, right lateral 10/21/2020   Diarrhea 04/11/2020   Epigastric pain 02/19/2020   Dark stools 02/19/2020   PAF (paroxysmal atrial fibrillation) ([place]) 02/05/2019   Shortness of breath 01/27/2019   Irregular surface of cornea 12/25/2018   HPV (human papilloma virus) infection 12/01/2016   Screening mammogram, encounter for 11/29/2016   Estrogen deficiency 11/29/2016   Encounter for routine gynecological examination 11/29/2016   Grade III hemorrhoids 10/06/2016   Tachycardia 08/14/2016   Hemorrhoids 08/13/2016   Atypical migraine 08/03/2016   CAD (coronary artery disease)    AAA (abdominal aortic aneurysm) (HCC)    Chronic combined systolic (congestive) and diastolic (congestive) heart failure ([place])    Prediabetes 02/11/2016   Generalized anxiety disorder 12/08/2015   Panic attacks 07/28/2015   Sensorineural hearing loss 03/27/2013   Vitamin D deficiency 07/03/2012   H/O aortic dissection 05/12/2012   Obesity (BMI 30.0-34.9) 11/21/2011   Routine general medical examination at a health care facility 09/20/2011   S/P AVR (aortic valve replacement) 01/07/2011   CORONARY ARTERY BYPASS GRAFT, HX OF 01/07/2011   Arthritis 12/20/2010   Back pain 12/15/2010   Dissection of thoracic aorta ([place]) 12/11/2010   H/O dissecting abdominal aortic aneurysm repair 08/24/2010   IRRITABLE BOWEL SYNDROME 09/09/2009   Obstructive sleep apnea  08/04/2009   External hemorrhoid 06/26/2009   Osteoporosis 01/09/2009   Hyperlipidemia 07/26/2007   Depression with anxiety 07/26/2007   Essential hypertension 07/26/2007   Allergic rhinitis 07/26/2007   Asthma 07/26/2007   GERD 07/26/2007   Osteoarthritis 07/26/2007   Ocular migraine 07/26/2007   Mild intermittent asthma without complication 38/88/2800    Immunization History  Administered Date(s) Administered   H1N1 01/09/2009   Influenza Split 09/20/2011, 09/11/2012   Influenza Whole 12/29/2006, 09/19/2008, 10/08/2010   Influenza, Seasonal, Injecte, Preservative Fre 10/08/2010   Influenza,inj,Quad PF,6+ Mos 10/08/2013, 08/13/2016, 10/27/2017, 10/05/2018, 09/17/2019   Influenza-Unspecified 09/26/2015   PFIZER Comirnaty(Gray Top)Covid-19 Tri-Sucrose Vaccine 05/15/2021   PFIZER(Purple Top)SARS-COV-2 Vaccination 03/12/2020, 04/02/2020, 10/10/2020   Pneumococcal Polysaccharide-23 12/29/2006   Td 01/09/2009, 04/03/2019    Conditions to be addressed/monitored:  Hypertension, Hyperlipidemia, and Heart Failure  Patient Care Plan: CCM Pharmacy Care Plan     Problem Identified: CHL AMB "PATIENT-SPECIFIC PROBLEM"      Long-Range Goal: Disease Managament   Priority: High  Note:   Current Barriers:   None identified  Pharmacist Clinical Goal(s):   Patient will contact provider office for questions/concerns as evidenced notation of same in electronic health record through collaboration with PharmD and provider.   Interventions:  1:1 collaboration with Tower, Wynelle Fanny, MD regarding development and update of comprehensive plan of care as evidenced by provider attestation and co-signature  Inter-disciplinary care team collaboration (see longitudinal plan of care)  Comprehensive medication review performed; medication list updated in electronic medical record  Hypertension (BP goal <130/80) -Controlled - per clinic readings  -No recent  medication changes, patient has lost 40 lbs  over the past year -Current treatment:  Carvedilol 6.25 mg - 1 tablet twice daily with meal  Lisinopril 5 mg - 1/2 tablet daily  -Medications previously tried: none Denies any recent BP checks at home, checks only if feeling bad  Salt intake: limits at the table, will use with cooking OTC medications including pseudoephedrine or NSAIDs? no -Denies dizziness or lightheadedness -Recommend continue current medications   Hyperlipidemia: (LDL goal < 70) -Not ideally controlled - LDL 141 (last checked 02/2020 - pt increased statin to daily again last month) -Current treatment: aspirin EC 81 MG tablet - 1 tablet daily Krill Oil (OMEGA-3) 500 MG CAPS - 1 capsule daily atorvastatin (LIPITOR) 20 MG tablet - 1 tablet daily (Per patient she is now taking this every day for the past month)  -Medications previously tried: daily atorvastatin, muscle cramps - *she reports she thought atorvastatin was causing the cramps but no improvement with dose reduction so she resumed daily dosing 04/2021* -Recommend continue current medications; Will re-check labs with PCP visit next month 06/2021  Heart Failure (Goal: manage symptoms and prevent exacerbations) -Controlled - reports weight stable, denies change from baseline in SOB -Followed by cardiology -Last ejection fraction: 45-50% (Date: 07/2019) -HF type: Combined Systolic and Diastolic -NYHA Class: II (slight limitation of activity) -AHA HF Stage: C (Heart disease and symptoms present) -Current treatment: furosemide (LASIX) 20 MG tablet - 1 tablet daily and an additional tablet daily as needed for swelling carvedilol (COREG) 6.25 MG tablet - 1 tablet twice daily with meal lisinopril (ZESTRIL) 5 MG tablet - 1/2 tablet daily potassium chloride (K-DUR) 10 MEQ tablet - 1 tablet BID PRN (pt taking differently: she takes potassium BID daily and 1:1 if additional Lasix) -Medications previously tried: none -Reports SOB has improved. Average oxygen level if  95-96% (on apple watch).  -She has not used albuterol in several years. She denies excess swelling. Does not check weight daily due to excessive concern about eating/anorexia. She does watch for swelling and symptoms of fluid overload and taking Lasix and potassium as directed. -Recommend Continue current medications   Patient Goals/Self-Care Activities  Patient will:  - contact Shevawn Langenberg with any chronic health concerns   Follow Up Plan: Telephone follow up appointment with care management team member scheduled for: 9-12 months or as needed  Other: She takes OTC MiraLAX every other day for constipation She increased Ozempic to 0.5 mg weekly 3 weeks ago for weight loss.  Uses Flexeril and Phenergan PRN migraines  Effexor - feels she is doing well on this Omeprazole daily - reports taking daily and may take BID if she has reflux symptoms  OTC Aleve - uses 1-2 times per month for Migraines  OTC Magnesium daily OTC Tart Cherry daily OTC Mylanta PRN for acid reflux - has seen GI in past, denies any abnormal findings  Care Gaps: Shingrix vaccine - she is going to touch base with her insurance or pharmacy about this  Mammogram - reminded patient, referral placed by PCP 06/05/21  Medication:                Last Fill:         Day Supply Atorvastatin 20 mg      03/22/2021        90ds Lisinopril 5 mg  03/22/2021  90ds   No gaps in adherence     Medication Assistance: Denies cost concerns   Patient's preferred pharmacy is: [facility]  Delivery - [address], [place] [address] Phone: [phone] Fax: [phone]  [facility] [phone] [address], [address] [place] [address] Phone: [phone] Fax: [phone]  Pharmacy: [facility] (maintenance medications - no copay)/[facility] (acute meds) ([employer])  Debbora Dus, PharmD Clinical Pharmacist [place]  [facility] at [facility] [phone]

## 2021-06-08 NOTE — Patient Instructions (Addendum)
Dear [name],  Below is a summary of the goals we discussed during our follow up appointment on June 08, 2021. Please contact me anytime with questions or concerns.   Visit Information  Patient Care Plan: CCM Pharmacy Care Plan     Problem Identified: CHL AMB "PATIENT-SPECIFIC PROBLEM"      Long-Range Goal: Disease Managament   Priority: High  Note:   Current Barriers:   None identified  Pharmacist Clinical Goal(s):   Patient will contact provider office for questions/concerns as evidenced notation of same in electronic health record through collaboration with PharmD and provider.   Interventions:  1:1 collaboration with Tower, Wynelle Fanny, MD regarding development and update of comprehensive plan of care as evidenced by provider attestation and co-signature  Inter-disciplinary care team collaboration (see longitudinal plan of care)  Comprehensive medication review performed; medication list updated in electronic medical record  Hypertension (BP goal <130/80) -Controlled - per clinic readings  -No recent medication changes, patient has lost 40 lbs over the past year -Current treatment:  Carvedilol 6.25 mg - 1 tablet twice daily with meal  Lisinopril 5 mg - 1/2 tablet daily  -Medications previously tried: none Denies any recent BP checks at home, checks only if feeling bad  Salt intake: limits at the table, will use with cooking OTC medications including pseudoephedrine or NSAIDs? no -Denies dizziness or lightheadedness -Recommend continue current medications   Hyperlipidemia: (LDL goal < 70) -Not ideally controlled - LDL 141 (last checked 02/2020 - pt increased statin to daily again last month) -Current treatment: aspirin EC 81 MG tablet - 1 tablet daily Krill Oil (OMEGA-3) 500 MG CAPS - 1 capsule daily atorvastatin (LIPITOR) 20 MG tablet - 1 tablet daily (Per patient she is now taking this every day for the past month)  -Medications previously tried: daily  atorvastatin, muscle cramps - *she reports she thought atorvastatin was causing the cramps but no improvement with dose reduction so she resumed daily dosing 04/2021* -Recommend continue current medications; Will re-check labs with PCP visit next month 06/2021  Heart Failure (Goal: manage symptoms and prevent exacerbations) -Controlled - reports weight stable, denies change from baseline in SOB -Followed by cardiology -Last ejection fraction: 45-50% (Date: 07/2019) -HF type: Combined Systolic and Diastolic -NYHA Class: II (slight limitation of activity) -AHA HF Stage: C (Heart disease and symptoms present) -Current treatment: furosemide (LASIX) 20 MG tablet - 1 tablet daily and an additional tablet daily as needed for swelling carvedilol (COREG) 6.25 MG tablet - 1 tablet twice daily with meal lisinopril (ZESTRIL) 5 MG tablet - 1/2 tablet daily potassium chloride (K-DUR) 10 MEQ tablet - 1 tablet BID PRN (pt taking differently: she takes potassium BID daily and 1:1 if additional Lasix) -Medications previously tried: none -Reports SOB has improved. Average oxygen level if 95-96% (on apple watch).  -She has not used albuterol in several years. She denies excess swelling. Does not check weight daily due to excessive concern about eating/anorexia. She does watch for swelling and symptoms of fluid overload and taking Lasix and potassium as directed. -Recommend Continue current medications   Patient Goals/Self-Care Activities  Patient will:  - contact [facility] with any chronic health concerns   Follow Up Plan: Telephone follow up appointment with care management team member scheduled for: 9-12 months or as needed  Other: She takes OTC MiraLAX every other day for constipation She increased Ozempic to 0.5 mg weekly 3 weeks ago for weight loss.  Uses Flexeril and Phenergan PRN migraines  Effexor - feels she is doing well on this Omeprazole daily - reports taking daily and may take BID if she has  reflux symptoms  OTC Aleve - uses 1-2 times per month for Migraines  OTC Magnesium daily OTC Tart Cherry daily OTC Mylanta PRN for acid reflux - has seen GI in past, denies any abnormal findings  Care Gaps: Shingrix vaccine - she is going to touch base with her insurance or pharmacy about this  Mammogram - reminded patient, referral placed by PCP 06/05/21  Medication:                Last Fill:         Day Supply Atorvastatin 20 mg      03/22/2021        90ds Lisinopril 5 mg  03/22/2021  90ds   No gaps in adherence       Patient verbalizes understanding of instructions provided today and agrees to view in [place].   Debbora Dus, PharmD Clinical Pharmacist [place] [facility] at [facility] [phone]

## 2021-06-08 NOTE — Therapy (Signed)
[place] MAIN [facility] SERVICES [address] [place], [place], [address] Phone: [phone]   Fax:  [phone]  Physical Therapy Treatment  Patient Details  Name: Rachel Torres MRN: [id number] Date of Birth: 05-Jul-1956 Referring Provider (PT): Gregor Hams, MD   Encounter Date: 06/08/2021   PT End of Session - 06/08/21 1605     Visit Number 16    Number of Visits 41    Date for PT Re-Evaluation 07/29/21    Authorization Type Eval performed 02/20/1961 (recert for 01/21/9797); new recert for 09/09/1940    PT Start Time 7408    PT Stop Time 1601    PT Time Calculation (min) 50 min    Activity Tolerance Patient tolerated treatment well;No increased pain    Behavior During Therapy WFL for tasks assessed/performed             Past Medical History:  Diagnosis Date   AAA (abdominal aortic aneurysm) ([name])    a. Chronic w/o evidence of aneurysmal dil on CTA 01/2016.   Alcohol abuse    Allergy    Anemia    Anxiety    CAD (coronary artery disease)    a. 08/2010 s/p CABG x 1 (VG->RCA) @ time of Ao dissection repair; b. 01/2016 Lexiscan MV: mid antsept/apical defect w/ ? peri-infarct ischemia-->likely attenuation-->Med Rx.   Chewing difficulty    Chronic combined systolic (congestive) and diastolic (congestive) heart failure ([place])    a. 2012 EF 30-35%; b. 04/2015 EF 40-45%; c. 01/2016 Echo: Ef 55-65%, nl AoV bioprosthesis, nl RV, nl PASP.   Constipation    Depression    Edema of both lower extremities    Gallbladder sludge    GERD (gastroesophageal reflux disease)    Heart failure (HCC)    Heart valve problem    History of Bicuspid Aortic Valve    a. 08/2010 s/p AVR @ time of Ao dissection repair; b. 01/2016 Echo: Ef 55-65%, nl AoV bioprosthesis, nl RV, nl PASP.   Hx of repair of dissecting thoracic aortic aneurysm, [place] type A    a. 08/2010 s/p repair with AVR and VG->RCA; b. 01/2016 CTA: stable appearance of Asc Thoracic Aortic graft.  Opacification of flase lumen of chronic abd Ao dissection w/ retrograde flow through lumbar arteries. No aneurysmal dil of flase lumen.   Hyperlipidemia    Hypertensive heart disease    IBS (irritable bowel syndrome)    Internal hemorrhoids    Kidney problem    Marfan syndrome    pt denies   Migraine    Obstructive sleep apnea    Osteoarthritis    Osteoporosis    Palpitations    Pneumonia    RA (rheumatoid arthritis) ([place])    a. Followed by Dr. [name]    Past Surgical History:  Procedure Laterality Date   ABDOMINAL AORTIC ANEURYSM REPAIR     CARDIAC CATHETERIZATION  2001   [place]     ESOPHAGOGASTRODUODENOSCOPY (EGD) WITH PROPOFOL N/A 05/08/2020   Procedure: ESOPHAGOGASTRODUODENOSCOPY (EGD) WITH PROPOFOL;  Surgeon: Virgel Manifold, MD;  Location: [facility] ENDOSCOPY;  Service: Endoscopy;  Laterality: N/A;   FRACTURE SURGERY Right    shoulder replacement   HEMORRHOID BANDING     ORIF DISTAL RADIUS FRACTURE Left    UPPER GASTROINTESTINAL ENDOSCOPY      There were no vitals filed for this visit.   Subjective Assessment - 06/08/21 1515  Subjective Pt reports R shoulder pain is currently 3/10. Pt reports she has had to "catch" her spouse a lot with RUE because his L leg has been giving out when she has been helping him with dressing. She says during these instances she has felt instant pain in R anterior shoulder. She thinks she has pulled it since she was last here.    Pertinent History Pt is a 65 y/o female presenting to therapy due to R shoulder pain. Pt had R shoulder replacement 12-13 years ago.  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 She says shoulder pain started this past January as she was helping lift her husband and his assistive devices. She reports she has since stopped lifting him and his walker as they have caregivers who come to the home for 16 hours/week and  can help. Pt is most concerned that her shoulder hurts when she drives and washes her hair. Her shoulder does not hurt constantly, but she states she has pain "the more I use it." She feels a catch and stabbing sensation in R anterior shoulder that can travel into bicep and that sometimes the pain is a burning sensation. She sometimes has pain in L shoulder.  She rates her worst R shoulder pain as 10/10, best pain is 0/10. Her R shoulder pain is currently 1.5/10. She reports that rest, ice and naproxen help with pain relief. She denies redness/swelling/temp difference and N/T in UEs.  Patient has been seen by other therapists in this clinic previously for cervicalgia and back related injuries/pain. Pt PMH includes COVID19, upper GI stomach issues, aorta type A dissection (2011) with aortic valve replacement, bypass of R coronary artery, HTN, hyperlipidemia, pre-diabetes, AFIB, CHF, depression, anxiety, OA, GERD, atypical migraine, asthma, and allergic rhinitis.    Limitations Lifting;House hold activities;Other (comment)    How long can you sit comfortably? Not affected    How long can you stand comfortably? Not affected    How long can you walk comfortably? Pt reports she has pain only if she is carrying something    Diagnostic tests DG R shoulder 02/09/2021 per chart "Impression: Possible hardware loosening post total shoulder arthroplasty. No  evidence of acute fracture or dislocation"    Patient Stated Goals Be able to drive without pain    Currently in Pain? Yes    Pain Score 3     Pain Location Shoulder    Pain Orientation Right;Anterior    Pain Onset More than a month ago            TherE  Supine on plinth -  PVC pipe overhead raise 2x10 - limited ROM d/t pain. Pt instructed to perform within pain-free range.  PVC ER on R UE -2x10 - Instructed to perform in pain-free range.  RTB ER 2x15; Instruction to perform in pain-free range.  RTB IR 2x15; Instruction to perform in pain-free range.   RTB R UE tricep ext. 2x10. No pain with exercise. Bicep curls AROM 10x B and with YTB in RUE - 10x. No pain with exercise. PVC chest press - attempted a few reps in decreased range, however, pt reports increase in sx with exercise so discontinued. Chin tucks - 1x10 with 5 second holds. PT provided manual cuing supporting pt's head.   Manual R UE shoulder distraction in varying degrees of abduction (below 90 deg.) - 4x20 sec bouts. Pt reports feels good. Seated, STM to R anterior delt, R  bicep, R upper trap and R cervical paraspinals, applying pressure over trigger points in ant. Bicep/delt and cervical paraspinals - x 9 min.  PT provides suboccip release with pt in supine while moving pt through the following stretches: --Levator scap stretch - 2x30 sec B -- Upper trap stretch - 2x30 sec B -- Cervical extensors stretch - 2x30 sec    PT Education - 06/08/21 1605     Education Details exercise technique, body mechanics with chin tucks, levator-scap stretch in supine    Person(s) Educated Patient    Methods Explanation;Demonstration;Tactile cues;Verbal cues    Comprehension Verbalized understanding;Returned demonstration              PT Short Term Goals - 05/08/21 0959       PT SHORT TERM GOAL #1   Title Patient will be independent in home exercise program to improve strength/mobility for better functional independence with ADLs.    Baseline 02/24/2021 Pt issued HEP; 5/19: HEP to be advanced, pt has been unable to perform HEP consistently d/t spouse surgery and son's wedding.    Time 6    Period Weeks    Status On-going    Target Date 06/17/21               PT Long Term Goals - 05/08/21 0959       PT LONG TERM GOAL #1   Title Patient will report a worst pain of 3/10  in last 7 days in her R shoulder to improve tolerance with ADLs and reduced symptoms with activities.    Baseline 02/24/2021: Pt reports worst pain as 10/10.; 5/18: 4/10 is worst pain    Time 12    Period  Weeks    Status Partially Met    Target Date 07/29/21      PT LONG TERM GOAL #2   Title Patient will decrease Quick DASH scores by > 8 points demonstrating reduced self-reported upper extremity disability.    Baseline 02/24/2021: QuickDash Disability/symptom score 40, Work score 37.5; 5/18: Disability/symtpom 38.6, Work 43.75    Time 12    Period Weeks    Status On-going    Target Date 07/29/21      PT LONG TERM GOAL #3   Title Patient will increase FOTO score to equal to or greater than 60 to demonstrate statistically significant improvement in mobility and quality of life.    Baseline 02/24/2021: 55; 5/18: 60    Time 12    Period Weeks    Status Achieved      PT LONG TERM GOAL #4   Title Patient will increase BUE MMT scores by at least 1/2 point as to improve functional strength and endurance for overhead activities and ADLs such as driving and washing her hair.    Baseline 02/24/2021: L/R shoulder flexion 4/5, 3+/5; shoulder abduction 4/5 B; shoulder extension 4+5 B; shoulder IR/ER both grossly 4/5 B; elbow flexion/extension 5/5 for both B; wrist flexion/extension 5/5 for both B; 5/18: L shoulder is grossly 4+/5.  R shoulder flexion 3+/5; R shoulder abduction 4-/5; R shoulder IR/ER both 4-/5 (B); R elbow flexion 4/5, elbow ext. 5/5 B, shoulder ext. 5/5 B    Time 12    Period Weeks    Status On-going    Target Date 07/29/21                   Plan - 06/08/21 1606     Clinical Impression Statement Session focused on genlte AROM and  therex as well as manual with pain-modulatory techniques as pt presents with increased RUE pain d/t caregiving requirements over the weekend that resulted in her "catching" her spouse with RUE. Pt able to tolerate limited ROM with R shoulder ER, IR and with chest presses. Pt with less noticable trigger points in R cervical paraspinals and R upper trap following previous dry needling sessions. Pt reported improvement in shoulder sx at end of session. The  pt will benefit from further skilled PT to improve RUE pain, strength and ROM to increase ease with ADLs.    Personal Factors and Comorbidities Comorbidity 1;Comorbidity 2;Comorbidity 3+;Past/Current Experience;Social Background;Time since onset of injury/illness/exacerbation;Transportation;Fitness;Other    Comorbidities Pertinent: recent hx of COVID19, HTN, hyperlipidemia, pre-diabetes, AFIB, CHF, depression, anxiety, OA, atypical migraine, asthma.    Examination-Activity Limitations Bathing;Bed Mobility;Hygiene/Grooming;Lift;Caring for Others;Reach Overhead;Carry;Dressing;Sleep;Other    Examination-Participation Restrictions Laundry;Cleaning;Driving;Community Activity;Other;Yard Work;Shop;Meal Prep;Volunteer    Stability/Clinical Decision Making Stable/Uncomplicated    Rehab Potential Good    PT Frequency 2x / week    PT Duration 12 weeks    PT Treatment/Interventions ADLs/Self Care Home Management;Biofeedback;Cryotherapy;Electrical Stimulation;Moist Heat;Traction;Ultrasound;DME Instruction;Functional mobility training;Therapeutic activities;Therapeutic exercise;Neuromuscular re-education;Patient/family education;Orthotic Fit/Training;Manual techniques;Scar mobilization;Passive range of motion;Dry needling;Taping;Energy conservation;Joint Manipulations;Spinal Manipulations;Gait training    PT Next Visit Plan Progress shoulder strengthening exercises; cervical stretches within pain tolerance, shoulder stability exercises/closed kinetic chain, closed kinetic chain push-up plus, continue plan as previously indicated    PT Home Exercise Plan Issued HEP with handout: RTB rows, RTB shoulder ER pull-aparts, and upper trap stretch; shoulder perturbation exercises, ROM for shoulder IR within pain-free range, mobilizations/distraction to R shoulder, continue training correct lifting mechanics in order to reduce strain on RUE, no changes on this date    Consulted and Agree with Plan of Care Patient              Patient will benefit from skilled therapeutic intervention in order to improve the following deficits and impairments:  Increased fascial restricitons, Improper body mechanics, Pain, Decreased mobility, Increased muscle spasms, Postural dysfunction, Decreased activity tolerance, Decreased endurance, Decreased range of motion, Decreased strength, Hypomobility, Impaired UE functional use, Impaired flexibility, Decreased coordination  Visit Diagnosis: Chronic right shoulder pain  Muscle weakness (generalized)  Other abnormalities of gait and mobility     Problem List Patient Active Problem List   Diagnosis Date Noted   Encounter for screening mammogram for breast cancer 06/05/2021   Abdominal pain, right lateral 10/21/2020   Diarrhea 04/11/2020   Epigastric pain 02/19/2020   Dark stools 02/19/2020   PAF (paroxysmal atrial fibrillation) ([place]) 02/05/2019   Shortness of breath 01/27/2019   Irregular surface of cornea 12/25/2018   HPV (human papilloma virus) infection 12/01/2016   Screening mammogram, encounter for 11/29/2016   Estrogen deficiency 11/29/2016   Encounter for routine gynecological examination 11/29/2016   Grade III hemorrhoids 10/06/2016   Tachycardia 08/14/2016   Hemorrhoids 08/13/2016   Atypical migraine 08/03/2016   CAD (coronary artery disease)    AAA (abdominal aortic aneurysm) (HCC)    Chronic combined systolic (congestive) and diastolic (congestive) heart failure ([place])    Prediabetes 02/11/2016   Generalized anxiety disorder 12/08/2015   Panic attacks 07/28/2015   Sensorineural hearing loss 03/27/2013   Vitamin D deficiency 07/03/2012   H/O aortic dissection 05/12/2012   Obesity (BMI 30.0-34.9) 11/21/2011   Routine general medical examination at a health care facility 09/20/2011   S/P AVR (aortic valve replacement) 01/07/2011   CORONARY ARTERY BYPASS GRAFT, HX OF 01/07/2011   Arthritis 12/20/2010  Back pain 12/15/2010   Dissection of thoracic aorta  ([place]) 12/11/2010   H/O dissecting abdominal aortic aneurysm repair 08/24/2010   IRRITABLE BOWEL SYNDROME 09/09/2009   Obstructive sleep apnea 08/04/2009   External hemorrhoid 06/26/2009   Osteoporosis 01/09/2009   Hyperlipidemia 07/26/2007   Depression with anxiety 07/26/2007   Essential hypertension 07/26/2007   Allergic rhinitis 07/26/2007   Asthma 07/26/2007   GERD 07/26/2007   Osteoarthritis 07/26/2007   Ocular migraine 07/26/2007   Mild intermittent asthma without complication 16/09/9603   Ricard Dillon PT, DPT 06/08/2021, 4:18 PM  [place] MAIN [facility] SERVICES [address] [place], [place], [address] Phone: [phone]   Fax:  [phone]  Name: Rachel Torres MRN: [id number] Date of Birth: 01/05/1956

## 2021-06-09 ENCOUNTER — Ambulatory Visit (INDEPENDENT_AMBULATORY_CARE_PROVIDER_SITE_OTHER): Payer: Medicare HMO | Admitting: Psychology

## 2021-06-09 DIAGNOSIS — F331 Major depressive disorder, recurrent, moderate: Secondary | ICD-10-CM | POA: Diagnosis not present

## 2021-06-09 DIAGNOSIS — F411 Generalized anxiety disorder: Secondary | ICD-10-CM | POA: Diagnosis not present

## 2021-06-10 ENCOUNTER — Other Ambulatory Visit: Payer: Self-pay

## 2021-06-10 ENCOUNTER — Ambulatory Visit: Payer: Medicare HMO

## 2021-06-10 DIAGNOSIS — G8929 Other chronic pain: Secondary | ICD-10-CM

## 2021-06-10 DIAGNOSIS — M6281 Muscle weakness (generalized): Secondary | ICD-10-CM

## 2021-06-10 DIAGNOSIS — M25511 Pain in right shoulder: Secondary | ICD-10-CM | POA: Diagnosis not present

## 2021-06-10 DIAGNOSIS — R293 Abnormal posture: Secondary | ICD-10-CM | POA: Diagnosis not present

## 2021-06-10 DIAGNOSIS — R278 Other lack of coordination: Secondary | ICD-10-CM | POA: Diagnosis not present

## 2021-06-10 DIAGNOSIS — M542 Cervicalgia: Secondary | ICD-10-CM | POA: Diagnosis not present

## 2021-06-10 DIAGNOSIS — R2689 Other abnormalities of gait and mobility: Secondary | ICD-10-CM | POA: Diagnosis not present

## 2021-06-10 NOTE — Therapy (Signed)
[place] MAIN [facility] SERVICES [address] [place], [place], [address] Phone: [phone]   Fax:  [phone]  Physical Therapy Treatment  Patient Details  Name: Rachel Torres MRN: [id number] Date of Birth: 1956-02-22 Referring Provider (PT): Gregor Hams, MD   Encounter Date: 06/10/2021   PT End of Session - 06/10/21 1627     Visit Number 17    Number of Visits 41    Date for PT Re-Evaluation 07/29/21    Authorization Type Eval performed 06/24/7340 (recert for 08/22/7901); new recert for 03/28/7352    PT Start Time 1517    PT Stop Time 1559    PT Time Calculation (min) 42 min    Activity Tolerance Patient tolerated treatment well    Behavior During Therapy [facility] for tasks assessed/performed             Past Medical History:  Diagnosis Date   AAA (abdominal aortic aneurysm) ([place])    a. Chronic w/o evidence of aneurysmal dil on CTA 01/2016.   Alcohol abuse    Allergy    Anemia    Anxiety    CAD (coronary artery disease)    a. 08/2010 s/p CABG x 1 (VG->RCA) @ time of Ao dissection repair; b. 01/2016 Lexiscan MV: mid antsept/apical defect w/ ? peri-infarct ischemia-->likely attenuation-->Med Rx.   Chewing difficulty    Chronic combined systolic (congestive) and diastolic (congestive) heart failure ([place])    a. 2012 EF 30-35%; b. 04/2015 EF 40-45%; c. 01/2016 Echo: Ef 55-65%, nl AoV bioprosthesis, nl RV, nl PASP.   Constipation    Depression    Edema of both lower extremities    Gallbladder sludge    GERD (gastroesophageal reflux disease)    Heart failure (HCC)    Heart valve problem    History of Bicuspid Aortic Valve    a. 08/2010 s/p AVR @ time of Ao dissection repair; b. 01/2016 Echo: Ef 55-65%, nl AoV bioprosthesis, nl RV, nl PASP.   Hx of repair of dissecting thoracic aortic aneurysm, [place] type A    a. 08/2010 s/p repair with AVR and VG->RCA; b. 01/2016 CTA: stable appearance of Asc Thoracic Aortic graft. Opacification of flase  lumen of chronic abd Ao dissection w/ retrograde flow through lumbar arteries. No aneurysmal dil of flase lumen.   Hyperlipidemia    Hypertensive heart disease    IBS (irritable bowel syndrome)    Internal hemorrhoids    Kidney problem    Marfan syndrome    pt denies   Migraine    Obstructive sleep apnea    Osteoarthritis    Osteoporosis    Palpitations    Pneumonia    RA (rheumatoid arthritis) ([place])    a. Followed by Dr. [name]    Past Surgical History:  Procedure Laterality Date   ABDOMINAL AORTIC ANEURYSM REPAIR     CARDIAC CATHETERIZATION  2001   [place]     ESOPHAGOGASTRODUODENOSCOPY (EGD) WITH PROPOFOL N/A 05/08/2020   Procedure: ESOPHAGOGASTRODUODENOSCOPY (EGD) WITH PROPOFOL;  Surgeon: Virgel Manifold, MD;  Location: [facility] ENDOSCOPY;  Service: Endoscopy;  Laterality: N/A;   FRACTURE SURGERY Right    shoulder replacement   HEMORRHOID BANDING     ORIF DISTAL RADIUS FRACTURE Left    UPPER GASTROINTESTINAL ENDOSCOPY      There were no vitals filed for this visit.   Subjective Assessment - 06/10/21 1625  Subjective Pt reports R shoulder pain as currently 2/10. Pt reports it still hurts to drive.    Pertinent History Pt is a 65 y/o female presenting to therapy due to R shoulder pain. Pt had R shoulder replacement 12-13 years ago.  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 She says shoulder pain started this past January as she was helping lift her husband and his assistive devices. She reports she has since stopped lifting him and his walker as they have caregivers who come to the home for 16 hours/week and can help. Pt is most concerned that her shoulder hurts when she drives and washes her hair. Her shoulder does not hurt constantly, but she states she has pain "the more I use it." She feels a catch and stabbing sensation in R anterior shoulder that can travel into  bicep and that sometimes the pain is a burning sensation. She sometimes has pain in L shoulder.  She rates her worst R shoulder pain as 10/10, best pain is 0/10. Her R shoulder pain is currently 1.5/10. She reports that rest, ice and naproxen help with pain relief. She denies redness/swelling/temp difference and N/T in UEs.  Patient has been seen by other therapists in this clinic previously for cervicalgia and back related injuries/pain. Pt PMH includes COVID19, upper GI stomach issues, aorta type A dissection (2011) with aortic valve replacement, bypass of R coronary artery, HTN, hyperlipidemia, pre-diabetes, AFIB, CHF, depression, anxiety, OA, GERD, atypical migraine, asthma, and allergic rhinitis.    Limitations Lifting;House hold activities;Other (comment)    How long can you sit comfortably? Not affected    How long can you stand comfortably? Not affected    How long can you walk comfortably? Pt reports she has pain only if she is carrying something    Diagnostic tests DG R shoulder 02/09/2021 per chart "Impression: Possible hardware loosening post total shoulder arthroplasty. No  evidence of acute fracture or dislocation"    Patient Stated Goals Be able to drive without pain    Currently in Pain? Yes    Pain Score 2     Pain Location Shoulder    Pain Orientation Right    Pain Onset More than a month ago            Treatment  Therex- Elasto-gel applied to R shoulder while pt performed exercises. Pt tolerates elasto-gel well, reporting it feels good on R shoulder. No adverse reaction to treatment.   R shoulder Pendulums - CW/CC, forward/backward, side-to-side 10x for each. Pt reports these feel good to shoudler Against wall, postural exercise - chin tucks with shoulder retraction 10x with 3 sec isometric Cat-cow- 10x. No pain with exercise. Quadruped with lateral weight-shifts - discontinued due to increased R wrist pain. Supine on plinth, PVC pipe overhead raise 2x10; continues to  exhibit limited ROM d/t pain. Chest press with PVC pipe, towel at side for TC - 2x10 On plinth, side-lying shoulder ER - attempted with 3#, however pain limited. Pt able to perform 3x10 with 1# dumbbell in limited ROM. Attempted supine punches, but discontinued d/t pain Supine on plinth, while PT provides suboccip release: --Upper trap stretch - 2x30 sec B -- Levator scap stretch 2x30 sec B -- Cervical ext. Stretch - 2x30 sec  Seated R tricep ext. With RTB - 3x10 Seated R bicep curl with RTB - 2x10 Pendulums 10x to R shoulder    Pt educated throughout session about proper posture  and technique with exercises. Improved exercise technique, movement at target joints, use of target muscles after min to mod verbal, visual, tactile cues.     PT Education - 06/10/21 1627     Education Details exercise technique, body mechanics    Person(s) Educated Patient    Methods Explanation;Demonstration;Verbal cues    Comprehension Verbalized understanding;Returned demonstration              PT Short Term Goals - 05/08/21 0959       PT SHORT TERM GOAL #1   Title Patient will be independent in home exercise program to improve strength/mobility for better functional independence with ADLs.    Baseline 02/24/2021 Pt issued HEP; 5/19: HEP to be advanced, pt has been unable to perform HEP consistently d/t spouse surgery and son's wedding.    Time 6    Period Weeks    Status On-going    Target Date 06/17/21               PT Long Term Goals - 05/08/21 0959       PT LONG TERM GOAL #1   Title Patient will report a worst pain of 3/10  in last 7 days in her R shoulder to improve tolerance with ADLs and reduced symptoms with activities.    Baseline 02/24/2021: Pt reports worst pain as 10/10.; 5/18: 4/10 is worst pain    Time 12    Period Weeks    Status Partially Met    Target Date 07/29/21      PT LONG TERM GOAL #2   Title Patient will decrease Quick DASH scores by > 8 points  demonstrating reduced self-reported upper extremity disability.    Baseline 02/24/2021: QuickDash Disability/symptom score 40, Work score 37.5; 5/18: Disability/symtpom 38.6, Work 43.75    Time 12    Period Weeks    Status On-going    Target Date 07/29/21      PT LONG TERM GOAL #3   Title Patient will increase FOTO score to equal to or greater than 60 to demonstrate statistically significant improvement in mobility and quality of life.    Baseline 02/24/2021: 55; 5/18: 60    Time 12    Period Weeks    Status Achieved      PT LONG TERM GOAL #4   Title Patient will increase BUE MMT scores by at least 1/2 point as to improve functional strength and endurance for overhead activities and ADLs such as driving and washing her hair.    Baseline 02/24/2021: L/R shoulder flexion 4/5, 3+/5; shoulder abduction 4/5 B; shoulder extension 4+5 B; shoulder IR/ER both grossly 4/5 B; elbow flexion/extension 5/5 for both B; wrist flexion/extension 5/5 for both B; 5/18: L shoulder is grossly 4+/5.  R shoulder flexion 3+/5; R shoulder abduction 4-/5; R shoulder IR/ER both 4-/5 (B); R elbow flexion 4/5, elbow ext. 5/5 B, shoulder ext. 5/5 B    Time 12    Period Weeks    Status On-going    Target Date 07/29/21                   Plan - 06/10/21 1628     Clinical Impression Statement Pt with improved tolerance to therex this session. However, pt sidelying R shoulder ER still pain limited. Pt was able to perform sidelying R shoulder ER with 1# weight through limited range. The pt wlil benefit from further skliled PT to improve RUE pain, strength and ROM to increase ease with  ADLs and to improve overall functional mobility.    Personal Factors and Comorbidities Comorbidity 1;Comorbidity 2;Comorbidity 3+;Past/Current Experience;Social Background;Time since onset of injury/illness/exacerbation;Transportation;Fitness;Other    Comorbidities Pertinent: recent hx of COVID19, HTN, hyperlipidemia, pre-diabetes, AFIB,  CHF, depression, anxiety, OA, atypical migraine, asthma.    Examination-Activity Limitations Bathing;Bed Mobility;Hygiene/Grooming;Lift;Caring for Others;Reach Overhead;Carry;Dressing;Sleep;Other    Examination-Participation Restrictions Laundry;Cleaning;Driving;Community Activity;Other;Yard Work;Shop;Meal Prep;Volunteer    Stability/Clinical Decision Making Stable/Uncomplicated    Rehab Potential Good    PT Frequency 2x / week    PT Duration 12 weeks    PT Treatment/Interventions ADLs/Self Care Home Management;Biofeedback;Cryotherapy;Electrical Stimulation;Moist Heat;Traction;Ultrasound;DME Instruction;Functional mobility training;Therapeutic activities;Therapeutic exercise;Neuromuscular re-education;Patient/family education;Orthotic Fit/Training;Manual techniques;Scar mobilization;Passive range of motion;Dry needling;Taping;Energy conservation;Joint Manipulations;Spinal Manipulations;Gait training    PT Next Visit Plan Progress shoulder strengthening exercises; cervical stretches within pain tolerance, shoulder stability exercises/closed kinetic chain, closed kinetic chain push-up plus, continue plan as previously indicated    PT Home Exercise Plan Issued HEP with handout: RTB rows, RTB shoulder ER pull-aparts, and upper trap stretch; shoulder perturbation exercises, ROM for shoulder IR within pain-free range, mobilizations/distraction to R shoulder, continue training correct lifting mechanics in order to reduce strain on RUE, no changes on this date    Consulted and Agree with Plan of Care Patient             Patient will benefit from skilled therapeutic intervention in order to improve the following deficits and impairments:  Increased fascial restricitons, Improper body mechanics, Pain, Decreased mobility, Increased muscle spasms, Postural dysfunction, Decreased activity tolerance, Decreased endurance, Decreased range of motion, Decreased strength, Hypomobility, Impaired UE functional use,  Impaired flexibility, Decreased coordination  Visit Diagnosis: Chronic right shoulder pain  Muscle weakness (generalized)     Problem List Patient Active Problem List   Diagnosis Date Noted   Encounter for screening mammogram for breast cancer 06/05/2021   Abdominal pain, right lateral 10/21/2020   Diarrhea 04/11/2020   Epigastric pain 02/19/2020   Dark stools 02/19/2020   PAF (paroxysmal atrial fibrillation) ([place]) 02/05/2019   Shortness of breath 01/27/2019   Irregular surface of cornea 12/25/2018   HPV (human papilloma virus) infection 12/01/2016   Screening mammogram, encounter for 11/29/2016   Estrogen deficiency 11/29/2016   Encounter for routine gynecological examination 11/29/2016   Grade III hemorrhoids 10/06/2016   Tachycardia 08/14/2016   Hemorrhoids 08/13/2016   Atypical migraine 08/03/2016   CAD (coronary artery disease)    AAA (abdominal aortic aneurysm) (HCC)    Chronic combined systolic (congestive) and diastolic (congestive) heart failure ([place])    Prediabetes 02/11/2016   Generalized anxiety disorder 12/08/2015   Panic attacks 07/28/2015   Sensorineural hearing loss 03/27/2013   Vitamin D deficiency 07/03/2012   H/O aortic dissection 05/12/2012   Obesity (BMI 30.0-34.9) 11/21/2011   Routine general medical examination at a health care facility 09/20/2011   S/P AVR (aortic valve replacement) 01/07/2011   CORONARY ARTERY BYPASS GRAFT, HX OF 01/07/2011   Arthritis 12/20/2010   Back pain 12/15/2010   Dissection of thoracic aorta ([place]) 12/11/2010   H/O dissecting abdominal aortic aneurysm repair 08/24/2010   IRRITABLE BOWEL SYNDROME 09/09/2009   Obstructive sleep apnea 08/04/2009   External hemorrhoid 06/26/2009   Osteoporosis 01/09/2009   Hyperlipidemia 07/26/2007   Depression with anxiety 07/26/2007   Essential hypertension 07/26/2007   Allergic rhinitis 07/26/2007   Asthma 07/26/2007   GERD 07/26/2007   Osteoarthritis 07/26/2007   Ocular migraine  07/26/2007   Mild intermittent asthma without complication 00/86/7619   Ricard Dillon PT, DPT 06/10/2021,  4:37 PM  [place] MAIN [facility] SERVICES [address] [place], [place], [address] Phone: [phone]   Fax:  [phone]  Name: Rachel Torres MRN: [id number] Date of Birth: February 27, 1956

## 2021-06-15 ENCOUNTER — Other Ambulatory Visit: Payer: Self-pay

## 2021-06-15 ENCOUNTER — Ambulatory Visit: Payer: Medicare HMO

## 2021-06-15 DIAGNOSIS — M6281 Muscle weakness (generalized): Secondary | ICD-10-CM | POA: Diagnosis not present

## 2021-06-15 DIAGNOSIS — R2689 Other abnormalities of gait and mobility: Secondary | ICD-10-CM | POA: Diagnosis not present

## 2021-06-15 DIAGNOSIS — R278 Other lack of coordination: Secondary | ICD-10-CM | POA: Diagnosis not present

## 2021-06-15 DIAGNOSIS — G8929 Other chronic pain: Secondary | ICD-10-CM

## 2021-06-15 DIAGNOSIS — R293 Abnormal posture: Secondary | ICD-10-CM | POA: Diagnosis not present

## 2021-06-15 DIAGNOSIS — M542 Cervicalgia: Secondary | ICD-10-CM | POA: Diagnosis not present

## 2021-06-15 DIAGNOSIS — M25511 Pain in right shoulder: Secondary | ICD-10-CM | POA: Diagnosis not present

## 2021-06-15 NOTE — Therapy (Signed)
[place] MAIN [facility] SERVICES [address] [place], [place], [address] Phone: [phone]   Fax:  [phone]  Physical Therapy Treatment  Patient Details  Name: Rachel Torres MRN: [id number] Date of Birth: 01/26/1956 Referring Provider (PT): Gregor Hams, MD   Encounter Date: 06/15/2021   PT End of Session - 06/15/21 1652     Visit Number 18    Number of Visits 41    Date for PT Re-Evaluation 07/29/21    Authorization Type Eval performed 12/27/4079 (recert for 03/23/8184); new recert for 6/31/4970    PT Start Time 1515    PT Stop Time 1559    PT Time Calculation (min) 44 min    Activity Tolerance Patient tolerated treatment well    Behavior During Therapy [facility] for tasks assessed/performed             Past Medical History:  Diagnosis Date   AAA (abdominal aortic aneurysm) ([place])    a. Chronic w/o evidence of aneurysmal dil on CTA 01/2016.   Alcohol abuse    Allergy    Anemia    Anxiety    CAD (coronary artery disease)    a. 08/2010 s/p CABG x 1 (VG->RCA) @ time of Ao dissection repair; b. 01/2016 Lexiscan MV: mid antsept/apical defect w/ ? peri-infarct ischemia-->likely attenuation-->Med Rx.   Chewing difficulty    Chronic combined systolic (congestive) and diastolic (congestive) heart failure ([place])    a. 2012 EF 30-35%; b. 04/2015 EF 40-45%; c. 01/2016 Echo: Ef 55-65%, nl AoV bioprosthesis, nl RV, nl PASP.   Constipation    Depression    Edema of both lower extremities    Gallbladder sludge    GERD (gastroesophageal reflux disease)    Heart failure (HCC)    Heart valve problem    History of Bicuspid Aortic Valve    a. 08/2010 s/p AVR @ time of Ao dissection repair; b. 01/2016 Echo: Ef 55-65%, nl AoV bioprosthesis, nl RV, nl PASP.   Hx of repair of dissecting thoracic aortic aneurysm, [place] type A    a. 08/2010 s/p repair with AVR and VG->RCA; b. 01/2016 CTA: stable appearance of Asc Thoracic Aortic graft. Opacification of flase  lumen of chronic abd Ao dissection w/ retrograde flow through lumbar arteries. No aneurysmal dil of flase lumen.   Hyperlipidemia    Hypertensive heart disease    IBS (irritable bowel syndrome)    Internal hemorrhoids    Kidney problem    Marfan syndrome    pt denies   Migraine    Obstructive sleep apnea    Osteoarthritis    Osteoporosis    Palpitations    Pneumonia    RA (rheumatoid arthritis) ([place])    a. Followed by Dr. [name]    Past Surgical History:  Procedure Laterality Date   ABDOMINAL AORTIC ANEURYSM REPAIR     CARDIAC CATHETERIZATION  2001   [place]     ESOPHAGOGASTRODUODENOSCOPY (EGD) WITH PROPOFOL N/A 05/08/2020   Procedure: ESOPHAGOGASTRODUODENOSCOPY (EGD) WITH PROPOFOL;  Surgeon: Virgel Manifold, MD;  Location: [facility] ENDOSCOPY;  Service: Endoscopy;  Laterality: N/A;   FRACTURE SURGERY Right    shoulder replacement   HEMORRHOID BANDING     ORIF DISTAL RADIUS FRACTURE Left    UPPER GASTROINTESTINAL ENDOSCOPY      There were no vitals filed for this visit.   Subjective Assessment - 06/15/21 1650  Subjective Patient went to a caregiver meeting in [place] over the weekend. Arm hurt from driving.    Pertinent History Pt is a 65 y/o female presenting to therapy due to R shoulder pain. Pt had R shoulder replacement 12-13 years ago.  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 She says shoulder pain started this past January as she was helping lift her husband and his assistive devices. She reports she has since stopped lifting him and his walker as they have caregivers who come to the home for 16 hours/week and can help. Pt is most concerned that her shoulder hurts when she drives and washes her hair. Her shoulder does not hurt constantly, but she states she has pain "the more I use it." She feels a catch and stabbing sensation in R anterior shoulder that can travel into  bicep and that sometimes the pain is a burning sensation. She sometimes has pain in L shoulder.  She rates her worst R shoulder pain as 10/10, best pain is 0/10. Her R shoulder pain is currently 1.5/10. She reports that rest, ice and naproxen help with pain relief. She denies redness/swelling/temp difference and N/T in UEs.  Patient has been seen by other therapists in this clinic previously for cervicalgia and back related injuries/pain. Pt PMH includes COVID19, upper GI stomach issues, aorta type A dissection (2011) with aortic valve replacement, bypass of R coronary artery, HTN, hyperlipidemia, pre-diabetes, AFIB, CHF, depression, anxiety, OA, GERD, atypical migraine, asthma, and allergic rhinitis.    Limitations Lifting;House hold activities;Other (comment)    How long can you sit comfortably? Not affected    How long can you stand comfortably? Not affected    How long can you walk comfortably? Pt reports she has pain only if she is carrying something    Diagnostic tests DG R shoulder 02/09/2021 per chart "Impression: Possible hardware loosening post total shoulder arthroplasty. No  evidence of acute fracture or dislocation"    Patient Stated Goals Be able to drive without pain    Currently in Pain? Yes    Pain Score 3     Pain Location Shoulder    Pain Orientation Right    Pain Descriptors / Indicators Aching    Pain Type Chronic pain    Pain Onset More than a month ago    Pain Frequency Intermittent                    [employer]Ex   Supine: PVC pipe overhead raise 10x RTB ER 15x; cues for slight increased extension of bicep for decreased demand on bicep   Standing: Wall walk flexion 5x, cue for pain free range of motion Wall walk abduction 5x; very challenging for patient Wall modified abduction with scapular retraction 10x Modified wall pushups 10x no pain.    Manual: AP right shoulder mobilization 4x 15 second grade II mobilizations Inferior right shoulder mobilization 4x  15 second grade II mobilizations. Inferior distraction x 4 with 5 second holds Grade II mobilizations to thoracic spine for postural correction and alignment 5 seconds x2 reps each level J mobilization for decreased kyphotic posturing and improved alignment 2x 30 second holds     Trigger Point Dry Needling (TDN), unbilled Education performed with patient regarding potential benefit of TDN. Reviewed precautions and risks with patient. Reviewed special precautions/risks over lung fields which include pneumothorax. Reviewed signs and symptoms of pneumothorax and advised pt to go to ER immediately  if these symptoms develop advise them of dry needling treatment. Extensive time spent with pt to ensure full understanding of TDN risks. Pt provided verbal consent to treatment. TDN performed to  with 0.25 x 40 single needle placements with local twitch response (LTR). Pistoning technique utilized. Improved pain-free motion following intervention. Body part: R bicep, middle delt, and R upper trap. X4 minutes       Pt educated throughout session about proper posture and technique with exercises. Improved exercise technique, movement at target joints, use of target muscles after min to mod verbal, visual, tactile cues    Patient tolerated manual, therex, and dry needling with decreased pain by end of session. Large trigger points released in R upper trap with patient verbalizing decreased pain afterwards. Strengthening introduced after TDN however is continued to be limited by pain. The pt will benefit from further skilled PT to improve pain, UE ROM and strength to increase QOL.                    PT Education - 06/15/21 1651     Education Details exercise technique, body mechanics, TDN    Person(s) Educated Patient    Methods Explanation;Demonstration;Tactile cues;Verbal cues    Comprehension Verbalized understanding;Returned demonstration;Verbal cues required;Tactile cues required               PT Short Term Goals - 05/08/21 0959       PT SHORT TERM GOAL #1   Title Patient will be independent in home exercise program to improve strength/mobility for better functional independence with ADLs.    Baseline 02/24/2021 Pt issued HEP; 5/19: HEP to be advanced, pt has been unable to perform HEP consistently d/t spouse surgery and son's wedding.    Time 6    Period Weeks    Status On-going    Target Date 06/17/21               PT Long Term Goals - 05/08/21 0959       PT LONG TERM GOAL #1   Title Patient will report a worst pain of 3/10  in last 7 days in her R shoulder to improve tolerance with ADLs and reduced symptoms with activities.    Baseline 02/24/2021: Pt reports worst pain as 10/10.; 5/18: 4/10 is worst pain    Time 12    Period Weeks    Status Partially Met    Target Date 07/29/21      PT LONG TERM GOAL #2   Title Patient will decrease Quick DASH scores by > 8 points demonstrating reduced self-reported upper extremity disability.    Baseline 02/24/2021: QuickDash Disability/symptom score 40, Work score 37.5; 5/18: Disability/symtpom 38.6, Work 43.75    Time 12    Period Weeks    Status On-going    Target Date 07/29/21      PT LONG TERM GOAL #3   Title Patient will increase FOTO score to equal to or greater than 60 to demonstrate statistically significant improvement in mobility and quality of life.    Baseline 02/24/2021: 55; 5/18: 60    Time 12    Period Weeks    Status Achieved      PT LONG TERM GOAL #4   Title Patient will increase BUE MMT scores by at least 1/2 point as to improve functional strength and endurance for overhead activities and ADLs such as driving and washing her hair.    Baseline 02/24/2021: L/R shoulder flexion 4/5, 3+/5; shoulder  abduction 4/5 B; shoulder extension 4+5 B; shoulder IR/ER both grossly 4/5 B; elbow flexion/extension 5/5 for both B; wrist flexion/extension 5/5 for both B; 5/18: L shoulder is grossly 4+/5.  R shoulder  flexion 3+/5; R shoulder abduction 4-/5; R shoulder IR/ER both 4-/5 (B); R elbow flexion 4/5, elbow ext. 5/5 B, shoulder ext. 5/5 B    Time 12    Period Weeks    Status On-going    Target Date 07/29/21                   Plan - 06/15/21 1753     Clinical Impression Statement Patient tolerated manual, therex, and dry needling with decreased pain by end of session. Large trigger points released in R upper trap with patient verbalizing decreased pain afterwards. Strengthening introduced after TDN however is continued to be limited by pain. The pt will benefit from further skilled PT to improve pain, UE ROM and strength to increase QOL.    Personal Factors and Comorbidities Comorbidity 1;Comorbidity 2;Comorbidity 3+;Past/Current Experience;Social Background;Time since onset of injury/illness/exacerbation;Transportation;Fitness;Other    Comorbidities Pertinent: recent hx of COVID19, HTN, hyperlipidemia, pre-diabetes, AFIB, CHF, depression, anxiety, OA, atypical migraine, asthma.    Examination-Activity Limitations Bathing;Bed Mobility;Hygiene/Grooming;Lift;Caring for Others;Reach Overhead;Carry;Dressing;Sleep;Other    Examination-Participation Restrictions Laundry;Cleaning;Driving;Community Activity;Other;Yard Work;Shop;Meal Prep;Volunteer    Stability/Clinical Decision Making Stable/Uncomplicated    Rehab Potential Good    PT Frequency 2x / week    PT Duration 12 weeks    PT Treatment/Interventions ADLs/Self Care Home Management;Biofeedback;Cryotherapy;Electrical Stimulation;Moist Heat;Traction;Ultrasound;DME Instruction;Functional mobility training;Therapeutic activities;Therapeutic exercise;Neuromuscular re-education;Patient/family education;Orthotic Fit/Training;Manual techniques;Scar mobilization;Passive range of motion;Dry needling;Taping;Energy conservation;Joint Manipulations;Spinal Manipulations;Gait training    PT Next Visit Plan Progress shoulder strengthening exercises; cervical  stretches within pain tolerance, shoulder stability exercises/closed kinetic chain, closed kinetic chain push-up plus, continue plan as previously indicated    PT Home Exercise Plan Issued HEP with handout: RTB rows, RTB shoulder ER pull-aparts, and upper trap stretch; shoulder perturbation exercises, ROM for shoulder IR within pain-free range, mobilizations/distraction to R shoulder, continue training correct lifting mechanics in order to reduce strain on RUE, no changes on this date    Consulted and Agree with Plan of Care Patient             Patient will benefit from skilled therapeutic intervention in order to improve the following deficits and impairments:  Increased fascial restricitons, Improper body mechanics, Pain, Decreased mobility, Increased muscle spasms, Postural dysfunction, Decreased activity tolerance, Decreased endurance, Decreased range of motion, Decreased strength, Hypomobility, Impaired UE functional use, Impaired flexibility, Decreased coordination  Visit Diagnosis: Chronic right shoulder pain  Muscle weakness (generalized)     Problem List Patient Active Problem List   Diagnosis Date Noted   Encounter for screening mammogram for breast cancer 06/05/2021   Abdominal pain, right lateral 10/21/2020   Diarrhea 04/11/2020   Epigastric pain 02/19/2020   Dark stools 02/19/2020   PAF (paroxysmal atrial fibrillation) ([place]) 02/05/2019   Shortness of breath 01/27/2019   Irregular surface of cornea 12/25/2018   HPV (human papilloma virus) infection 12/01/2016   Screening mammogram, encounter for 11/29/2016   Estrogen deficiency 11/29/2016   Encounter for routine gynecological examination 11/29/2016   Grade III hemorrhoids 10/06/2016   Tachycardia 08/14/2016   Hemorrhoids 08/13/2016   Atypical migraine 08/03/2016   CAD (coronary artery disease)    AAA (abdominal aortic aneurysm) (HCC)    Chronic combined systolic (congestive) and diastolic (congestive) heart  failure ([place])    Prediabetes 02/11/2016   Generalized anxiety  disorder 12/08/2015   Panic attacks 07/28/2015   Sensorineural hearing loss 03/27/2013   Vitamin D deficiency 07/03/2012   H/O aortic dissection 05/12/2012   Obesity (BMI 30.0-34.9) 11/21/2011   Routine general medical examination at a health care facility 09/20/2011   S/P AVR (aortic valve replacement) 01/07/2011   CORONARY ARTERY BYPASS GRAFT, HX OF 01/07/2011   Arthritis 12/20/2010   Back pain 12/15/2010   Dissection of thoracic aorta ([place]) 12/11/2010   H/O dissecting abdominal aortic aneurysm repair 08/24/2010   IRRITABLE BOWEL SYNDROME 09/09/2009   Obstructive sleep apnea 08/04/2009   External hemorrhoid 06/26/2009   Osteoporosis 01/09/2009   Hyperlipidemia 07/26/2007   Depression with anxiety 07/26/2007   Essential hypertension 07/26/2007   Allergic rhinitis 07/26/2007   Asthma 07/26/2007   GERD 07/26/2007   Osteoarthritis 07/26/2007   Ocular migraine 07/26/2007   Mild intermittent asthma without complication 36/05/7702    Janna Arch, PT, DPT  06/15/2021, 5:53 PM  [place] MAIN [facility] SERVICES [address] [place], [place], [address] Phone: [phone]   Fax:  [phone]  Name: Rachel Torres MRN: [id number] Date of Birth: 18-Aug-1956

## 2021-06-17 ENCOUNTER — Other Ambulatory Visit: Payer: Self-pay | Admitting: Cardiovascular Disease

## 2021-06-17 ENCOUNTER — Ambulatory Visit: Payer: Medicare HMO

## 2021-06-17 ENCOUNTER — Other Ambulatory Visit: Payer: Self-pay

## 2021-06-17 DIAGNOSIS — G8929 Other chronic pain: Secondary | ICD-10-CM | POA: Diagnosis not present

## 2021-06-17 DIAGNOSIS — R2689 Other abnormalities of gait and mobility: Secondary | ICD-10-CM | POA: Diagnosis not present

## 2021-06-17 DIAGNOSIS — R278 Other lack of coordination: Secondary | ICD-10-CM | POA: Diagnosis not present

## 2021-06-17 DIAGNOSIS — M6281 Muscle weakness (generalized): Secondary | ICD-10-CM | POA: Diagnosis not present

## 2021-06-17 DIAGNOSIS — M25511 Pain in right shoulder: Secondary | ICD-10-CM | POA: Diagnosis not present

## 2021-06-17 DIAGNOSIS — R293 Abnormal posture: Secondary | ICD-10-CM | POA: Diagnosis not present

## 2021-06-17 DIAGNOSIS — M542 Cervicalgia: Secondary | ICD-10-CM | POA: Diagnosis not present

## 2021-06-17 NOTE — Therapy (Signed)
[place] MAIN [facility] SERVICES [address] [place], [place], [address] Phone: [phone]   Fax:  [phone]  Physical Therapy Treatment  Patient Details  Name: Rachel Torres MRN: [id number] Date of Birth: February 25, 1956 Referring Provider (PT): Gregor Hams, MD   Encounter Date: 06/17/2021   PT End of Session - 06/17/21 1717     Visit Number 19    Number of Visits 75    Date for PT Re-Evaluation 07/29/21    Authorization Type Eval performed 4/0/1027 (recert for 01/24/3663); new recert for 03/22/4741    PT Start Time 1506    PT Stop Time 1548    PT Time Calculation (min) 42 min    Activity Tolerance Patient tolerated treatment well    Behavior During Therapy [facility] for tasks assessed/performed             Past Medical History:  Diagnosis Date   AAA (abdominal aortic aneurysm) ([place])    a. Chronic w/o evidence of aneurysmal dil on CTA 01/2016.   Alcohol abuse    Allergy    Anemia    Anxiety    CAD (coronary artery disease)    a. 08/2010 s/p CABG x 1 (VG->RCA) @ time of Ao dissection repair; b. 01/2016 Lexiscan MV: mid antsept/apical defect w/ ? peri-infarct ischemia-->likely attenuation-->Med Rx.   Chewing difficulty    Chronic combined systolic (congestive) and diastolic (congestive) heart failure ([place])    a. 2012 EF 30-35%; b. 04/2015 EF 40-45%; c. 01/2016 Echo: Ef 55-65%, nl AoV bioprosthesis, nl RV, nl PASP.   Constipation    Depression    Edema of both lower extremities    Gallbladder sludge    GERD (gastroesophageal reflux disease)    Heart failure (HCC)    Heart valve problem    History of Bicuspid Aortic Valve    a. 08/2010 s/p AVR @ time of Ao dissection repair; b. 01/2016 Echo: Ef 55-65%, nl AoV bioprosthesis, nl RV, nl PASP.   Hx of repair of dissecting thoracic aortic aneurysm, Stanford type A    a. 08/2010 s/p repair with AVR and VG->RCA; b. 01/2016 CTA: stable appearance of Asc Thoracic Aortic graft. Opacification of flase  lumen of chronic abd Ao dissection w/ retrograde flow through lumbar arteries. No aneurysmal dil of flase lumen.   Hyperlipidemia    Hypertensive heart disease    IBS (irritable bowel syndrome)    Internal hemorrhoids    Kidney problem    Marfan syndrome    pt denies   Migraine    Obstructive sleep apnea    Osteoarthritis    Osteoporosis    Palpitations    Pneumonia    RA (rheumatoid arthritis) ([place])    a. Followed by Dr. [name]    Past Surgical History:  Procedure Laterality Date   ABDOMINAL AORTIC ANEURYSM REPAIR     CARDIAC CATHETERIZATION  2001   [place]     ESOPHAGOGASTRODUODENOSCOPY (EGD) WITH PROPOFOL N/A 05/08/2020   Procedure: ESOPHAGOGASTRODUODENOSCOPY (EGD) WITH PROPOFOL;  Surgeon: Virgel Manifold, MD;  Location: [facility] ENDOSCOPY;  Service: Endoscopy;  Laterality: N/A;   FRACTURE SURGERY Right    shoulder replacement   HEMORRHOID BANDING     ORIF DISTAL RADIUS FRACTURE Left    UPPER GASTROINTESTINAL ENDOSCOPY      There were no vitals filed for this visit.  TREATMENT  Manual Therapy- Elasto-gel applied to R shoulder  for session as the following was performed-  Inferior distraction to R shoulder x multiple bouts of 10 sec holds  Pt supine on plinth - STM to R cervical paraspinals, trigger point noted in in upper cervical paraspinals, no noted trigger points in R shoulder RTC muscualture or upper trap on this date. Improvement reported from pt as well. X 5 min  Suboccip release provided by PT as PT assisted pt through the following stretches: Levator scap stretch - 2x60 sec B with overpressure applied to contralat. Shoulder Upper trap stretch - 2x60 sec B Cervical extensors stretch - 2x60 sec Pec stretch 60 sec B  Therex:  Supine PVC pipe chest press 10x  PVC shoulder flexion raise 10x Prone Is - 10x with 3 sec hold. Pt rates difficult. Attempted prone Ts and Ws but pt unable d/t pain, so  discontinued.  Seated thoracic extension over chair with chin tuck 2x10 Seated chin tucks 10x Seated chin tuck with cervical extensor stretch - 2x30 sec  Seated physioball rollouts forward/backward and side-to-side within pain-free range - 10x each   Elasto-gel removed at end of session. Pt tolerates elasto-gel well, reporting it feels good on R shoulder. No adverse reaction to treatment.     Plan - 06/17/21 1717     Clinical Impression Statement STM performed this session where only one trigger point was noted and reported in R cerivcal paraspinals, and pt reporting less tenderness throughout RUE shoulder musculature. This indicates overall improvement in RUE sx since previous session. Although pt demonstrates improvement, the pt is still pain-limited with R shoulder abduction at 90 degrees or greater and indicates pain is located near R anterior delt/bicep insertion. The pt will benefit from further skilled PT to continue to improve pain, and UE strength and ROM to increase QOL.    Personal Factors and Comorbidities Comorbidity 1;Comorbidity 2;Comorbidity 3+;Past/Current Experience;Social Background;Time since onset of injury/illness/exacerbation;Transportation;Fitness;Other    Comorbidities Pertinent: recent hx of COVID19, HTN, hyperlipidemia, pre-diabetes, AFIB, CHF, depression, anxiety, OA, atypical migraine, asthma.    Examination-Activity Limitations Bathing;Bed Mobility;Hygiene/Grooming;Lift;Caring for Others;Reach Overhead;Carry;Dressing;Sleep;Other    Examination-Participation Restrictions Laundry;Cleaning;Driving;Community Activity;Other;Yard Work;Shop;Meal Prep;Volunteer    Stability/Clinical Decision Making Stable/Uncomplicated    Rehab Potential Good    PT Frequency 2x / week    PT Duration 12 weeks    PT Treatment/Interventions ADLs/Self Care Home Management;Biofeedback;Cryotherapy;Electrical Stimulation;Moist Heat;Traction;Ultrasound;DME Instruction;Functional mobility  training;Therapeutic activities;Therapeutic exercise;Neuromuscular re-education;Patient/family education;Orthotic Fit/Training;Manual techniques;Scar mobilization;Passive range of motion;Dry needling;Taping;Energy conservation;Joint Manipulations;Spinal Manipulations;Gait training    PT Next Visit Plan Progress shoulder strengthening exercises; cervical stretches within pain tolerance, shoulder stability exercises/closed kinetic chain, closed kinetic chain push-up plus, continue plan as previously indicated    PT Home Exercise Plan Issued HEP with handout: RTB rows, RTB shoulder ER pull-aparts, and upper trap stretch; shoulder perturbation exercises, ROM for shoulder IR within pain-free range, mobilizations/distraction to R shoulder, continue training correct lifting mechanics in order to reduce strain on RUE, no changes on this date    Consulted and Agree with Plan of Care Patient               PT Short Term Goals - 05/08/21 0959       PT SHORT TERM GOAL #1   Title Patient will be independent in home exercise program to improve strength/mobility for better functional independence with ADLs.    Baseline 02/24/2021 Pt issued HEP; 5/19: HEP to be advanced, pt has been unable to perform HEP consistently d/t spouse surgery and son's wedding.    Time 6  Period Weeks    Status On-going    Target Date 06/17/21               PT Long Term Goals - 05/08/21 0959       PT LONG TERM GOAL #1   Title Patient will report a worst pain of 3/10  in last 7 days in her R shoulder to improve tolerance with ADLs and reduced symptoms with activities.    Baseline 02/24/2021: Pt reports worst pain as 10/10.; 5/18: 4/10 is worst pain    Time 12    Period Weeks    Status Partially Met    Target Date 07/29/21      PT LONG TERM GOAL #2   Title Patient will decrease Quick DASH scores by > 8 points demonstrating reduced self-reported upper extremity disability.    Baseline 02/24/2021: QuickDash  Disability/symptom score 40, Work score 37.5; 5/18: Disability/symtpom 38.6, Work 43.75    Time 12    Period Weeks    Status On-going    Target Date 07/29/21      PT LONG TERM GOAL #3   Title Patient will increase FOTO score to equal to or greater than 60 to demonstrate statistically significant improvement in mobility and quality of life.    Baseline 02/24/2021: 55; 5/18: 60    Time 12    Period Weeks    Status Achieved      PT LONG TERM GOAL #4   Title Patient will increase BUE MMT scores by at least 1/2 point as to improve functional strength and endurance for overhead activities and ADLs such as driving and washing her hair.    Baseline 02/24/2021: L/R shoulder flexion 4/5, 3+/5; shoulder abduction 4/5 B; shoulder extension 4+5 B; shoulder IR/ER both grossly 4/5 B; elbow flexion/extension 5/5 for both B; wrist flexion/extension 5/5 for both B; 5/18: L shoulder is grossly 4+/5.  R shoulder flexion 3+/5; R shoulder abduction 4-/5; R shoulder IR/ER both 4-/5 (B); R elbow flexion 4/5, elbow ext. 5/5 B, shoulder ext. 5/5 B    Time 12    Period Weeks    Status On-going    Target Date 07/29/21              Patient will benefit from skilled therapeutic intervention in order to improve the following deficits and impairments:  Increased fascial restricitons, Improper body mechanics, Pain, Decreased mobility, Increased muscle spasms, Postural dysfunction, Decreased activity tolerance, Decreased endurance, Decreased range of motion, Decreased strength, Hypomobility, Impaired UE functional use, Impaired flexibility, Decreased coordination  Visit Diagnosis: Chronic right shoulder pain  Muscle weakness (generalized)     Problem List Patient Active Problem List   Diagnosis Date Noted   Encounter for screening mammogram for breast cancer 06/05/2021   Abdominal pain, right lateral 10/21/2020   Diarrhea 04/11/2020   Epigastric pain 02/19/2020   Dark stools 02/19/2020   PAF (paroxysmal  atrial fibrillation) ([place]) 02/05/2019   Shortness of breath 01/27/2019   Irregular surface of cornea 12/25/2018   HPV (human papilloma virus) infection 12/01/2016   Screening mammogram, encounter for 11/29/2016   Estrogen deficiency 11/29/2016   Encounter for routine gynecological examination 11/29/2016   Grade III hemorrhoids 10/06/2016   Tachycardia 08/14/2016   Hemorrhoids 08/13/2016   Atypical migraine 08/03/2016   CAD (coronary artery disease)    AAA (abdominal aortic aneurysm) (HCC)    Chronic combined systolic (congestive) and diastolic (congestive) heart failure ([place])    Prediabetes 02/11/2016   Generalized anxiety disorder  12/08/2015   Panic attacks 07/28/2015   Sensorineural hearing loss 03/27/2013   Vitamin D deficiency 07/03/2012   H/O aortic dissection 05/12/2012   Obesity (BMI 30.0-34.9) 11/21/2011   Routine general medical examination at a health care facility 09/20/2011   S/P AVR (aortic valve replacement) 01/07/2011   CORONARY ARTERY BYPASS GRAFT, HX OF 01/07/2011   Arthritis 12/20/2010   Back pain 12/15/2010   Dissection of thoracic aorta ([place]) 12/11/2010   H/O dissecting abdominal aortic aneurysm repair 08/24/2010   IRRITABLE BOWEL SYNDROME 09/09/2009   Obstructive sleep apnea 08/04/2009   External hemorrhoid 06/26/2009   Osteoporosis 01/09/2009   Hyperlipidemia 07/26/2007   Depression with anxiety 07/26/2007   Essential hypertension 07/26/2007   Allergic rhinitis 07/26/2007   Asthma 07/26/2007   GERD 07/26/2007   Osteoarthritis 07/26/2007   Ocular migraine 07/26/2007   Mild intermittent asthma without complication 10/18/1313   Ricard Dillon PT, DPT 06/17/2021, 5:29 PM   [facility] MAIN [facility] SERVICES [address] [place], [place], [address] Phone: [phone]   Fax:  [phone]  Name: Rachel Torres MRN: [id number] Date of Birth: 1956-10-02

## 2021-06-23 ENCOUNTER — Encounter (INDEPENDENT_AMBULATORY_CARE_PROVIDER_SITE_OTHER): Payer: Self-pay | Admitting: Family Medicine

## 2021-06-23 ENCOUNTER — Other Ambulatory Visit: Payer: Self-pay

## 2021-06-23 ENCOUNTER — Ambulatory Visit (INDEPENDENT_AMBULATORY_CARE_PROVIDER_SITE_OTHER): Payer: Medicare HMO | Admitting: Family Medicine

## 2021-06-23 VITALS — BP 109/68 | HR 82 | Temp 97.4°F | Ht 65.0 in | Wt 174.0 lb

## 2021-06-23 DIAGNOSIS — R7303 Prediabetes: Secondary | ICD-10-CM | POA: Diagnosis not present

## 2021-06-23 DIAGNOSIS — E559 Vitamin D deficiency, unspecified: Secondary | ICD-10-CM | POA: Diagnosis not present

## 2021-06-23 DIAGNOSIS — K5909 Other constipation: Secondary | ICD-10-CM

## 2021-06-23 DIAGNOSIS — Z6835 Body mass index (BMI) 35.0-35.9, adult: Secondary | ICD-10-CM

## 2021-06-23 MED ORDER — OZEMPIC (0.25 OR 0.5 MG/DOSE) 2 MG/1.5ML ~~LOC~~ SOPN: 0.5000 mg | PEN_INJECTOR | SUBCUTANEOUS | 0 refills | Status: DC

## 2021-06-23 MED ORDER — POLYETHYLENE GLYCOL 3350 17 GM/SCOOP PO POWD: 1.0000 | Freq: Once | ORAL | 0 refills | Status: DC

## 2021-06-24 MED ORDER — POLYETHYLENE GLYCOL 3350 17 GM/SCOOP PO POWD: 17.0000 g | ORAL | 0 refills | Status: DC | PRN

## 2021-06-25 ENCOUNTER — Ambulatory Visit: Payer: Medicare HMO | Attending: Family Medicine

## 2021-06-25 ENCOUNTER — Other Ambulatory Visit: Payer: Self-pay

## 2021-06-25 DIAGNOSIS — R278 Other lack of coordination: Secondary | ICD-10-CM | POA: Diagnosis not present

## 2021-06-25 DIAGNOSIS — R293 Abnormal posture: Secondary | ICD-10-CM | POA: Insufficient documentation

## 2021-06-25 DIAGNOSIS — M5412 Radiculopathy, cervical region: Secondary | ICD-10-CM | POA: Insufficient documentation

## 2021-06-25 DIAGNOSIS — M6281 Muscle weakness (generalized): Secondary | ICD-10-CM

## 2021-06-25 DIAGNOSIS — R2689 Other abnormalities of gait and mobility: Secondary | ICD-10-CM | POA: Insufficient documentation

## 2021-06-25 DIAGNOSIS — M546 Pain in thoracic spine: Secondary | ICD-10-CM | POA: Insufficient documentation

## 2021-06-25 DIAGNOSIS — M542 Cervicalgia: Secondary | ICD-10-CM | POA: Insufficient documentation

## 2021-06-25 DIAGNOSIS — M25511 Pain in right shoulder: Secondary | ICD-10-CM | POA: Diagnosis not present

## 2021-06-25 DIAGNOSIS — G8929 Other chronic pain: Secondary | ICD-10-CM | POA: Insufficient documentation

## 2021-06-25 NOTE — Therapy (Signed)
[place] MAIN [facility] SERVICES [address] [place], [place], [address] Phone: [phone]   Fax:  [phone]  Physical Therapy Treatment/Physical Therapy Progress Note   Dates of reporting period  05/08/2021   to   06/25/2021   Patient Details  Name: Rachel Torres MRN: [id number] Date of Birth: 01-02-1956 Referring Provider (PT): Gregor Hams, MD   Encounter Date: 06/25/2021   PT End of Session - 06/26/21 1042     Visit Number 20    Number of Visits 41    Date for PT Re-Evaluation 07/29/21    Authorization Type Eval performed 02/21/3613 (recert for 03/23/1539); new recert for 0/86/7619    PT Start Time 5093    PT Stop Time 1430    PT Time Calculation (min) 41 min    Activity Tolerance Patient tolerated treatment well;No increased pain    Behavior During Therapy WFL for tasks assessed/performed             Past Medical History:  Diagnosis Date   AAA (abdominal aortic aneurysm) ([place])    a. Chronic w/o evidence of aneurysmal dil on CTA 01/2016.   Alcohol abuse    Allergy    Anemia    Anxiety    CAD (coronary artery disease)    a. 08/2010 s/p CABG x 1 (VG->RCA) @ time of Ao dissection repair; b. 01/2016 Lexiscan MV: mid antsept/apical defect w/ ? peri-infarct ischemia-->likely attenuation-->Med Rx.   Chewing difficulty    Chronic combined systolic (congestive) and diastolic (congestive) heart failure ([place])    a. 2012 EF 30-35%; b. 04/2015 EF 40-45%; c. 01/2016 Echo: Ef 55-65%, nl AoV bioprosthesis, nl RV, nl PASP.   Constipation    Depression    Edema of both lower extremities    Gallbladder sludge    GERD (gastroesophageal reflux disease)    Heart failure (HCC)    Heart valve problem    History of Bicuspid Aortic Valve    a. 08/2010 s/p AVR @ time of Ao dissection repair; b. 01/2016 Echo: Ef 55-65%, nl AoV bioprosthesis, nl RV, nl PASP.   Hx of repair of dissecting thoracic aortic aneurysm, Stanford type A    a. 08/2010 s/p repair  with AVR and VG->RCA; b. 01/2016 CTA: stable appearance of Asc Thoracic Aortic graft. Opacification of flase lumen of chronic abd Ao dissection w/ retrograde flow through lumbar arteries. No aneurysmal dil of flase lumen.   Hyperlipidemia    Hypertensive heart disease    IBS (irritable bowel syndrome)    Internal hemorrhoids    Kidney problem    Marfan syndrome    pt denies   Migraine    Obstructive sleep apnea    Osteoarthritis    Osteoporosis    Palpitations    Pneumonia    RA (rheumatoid arthritis) ([place])    a. Followed by Dr. [name]    Past Surgical History:  Procedure Laterality Date   ABDOMINAL AORTIC ANEURYSM REPAIR     CARDIAC CATHETERIZATION  2001   [facility]     ESOPHAGOGASTRODUODENOSCOPY (EGD) WITH PROPOFOL N/A 05/08/2020   Procedure: ESOPHAGOGASTRODUODENOSCOPY (EGD) WITH PROPOFOL;  Surgeon: Virgel Manifold, MD;  Location: [facility] ENDOSCOPY;  Service: Endoscopy;  Laterality: N/A;   FRACTURE SURGERY Right    shoulder replacement   HEMORRHOID BANDING     ORIF DISTAL RADIUS FRACTURE Left    UPPER GASTROINTESTINAL ENDOSCOPY  There were no vitals filed for this visit.   Subjective Assessment - 06/25/21 1350     Subjective Pt reports she feels tense today trying to navigate care at the [facility] for her husband. Pt reports B shoulder pain currently 0/10. Pt reports using her BUEs increases her pain to 4/10 B. Pt reports L shoulder pain over the last week.    Pertinent History Pt is a 65 y/o female presenting to therapy due to R shoulder pain. Pt had R shoulder replacement 12-13 years ago.  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 She says shoulder pain started this past January as she was helping lift her husband and his assistive devices. She reports she has since stopped lifting him and his walker as they have caregivers who come to the home for 16 hours/week and can help. Pt  is most concerned that her shoulder hurts when she drives and washes her hair. Her shoulder does not hurt constantly, but she states she has pain "the more I use it." She feels a catch and stabbing sensation in R anterior shoulder that can travel into bicep and that sometimes the pain is a burning sensation. She sometimes has pain in L shoulder.  She rates her worst R shoulder pain as 10/10, best pain is 0/10. Her R shoulder pain is currently 1.5/10. She reports that rest, ice and naproxen help with pain relief. She denies redness/swelling/temp difference and N/T in UEs.  Patient has been seen by other therapists in this clinic previously for cervicalgia and back related injuries/pain. Pt PMH includes COVID19, upper GI stomach issues, aorta type A dissection (2011) with aortic valve replacement, bypass of R coronary artery, HTN, hyperlipidemia, pre-diabetes, AFIB, CHF, depression, anxiety, OA, GERD, atypical migraine, asthma, and allergic rhinitis.    Limitations Lifting;House hold activities;Other (comment)    How long can you sit comfortably? Not affected    How long can you stand comfortably? Not affected    How long can you walk comfortably? Pt reports she has pain only if she is carrying something    Diagnostic tests DG R shoulder 02/09/2021 per chart "Impression: Possible hardware loosening post total shoulder arthroplasty. No  evidence of acute fracture or dislocation"    Patient Stated Goals Be able to drive without pain    Currently in Pain? No/denies   none currently, but B shoulder pain increases to 4/10 with use   Pain Onset More than a month ago            Reassessment of Goals:  Elasto-gel placed over R shoulder ant./posterior musculature throughout session. No adverse response to treatment observed (skin WNL) and pt reports elasto-gel helps shoulder sx.   Worst pain in past 7 days: 5/10 (previously 4/10. Pt does report increased use of BUEs for caregiver duties for spouse where she  thinks she's irritated UE).  FOTO: 56 (previously 60)  QuickDASH: Disability/sx score 50%, work 62.5% (previously 38.6 and 43.75)  MMT:  L shoulder grossly 4+/5, R shoulder 4/5 except shoulder flexion 4-/5, R shoulder IR/ER pain limited to 3/5 (mixed increases/decreases since previous testing)  Interventions:  Chin tucks - 2x10 with 3 sec holds   Upper trap stretch - 2x30 sec B  Cervical extensor stretch - 2x30 sec  Thoracic extension over chair - 10x  Wall ladder for R shoulder abduction - 4x up/down. No pain, however, pt not yet within normal limits.  Wall slides for shoulder flexion  and slide down into W with scapular retraction - 7x  Manual therapy- Pt seated, STM to R uppre trap, R RTC musculature and R cervical paraspinals. Trigger points noted throughout R upper trap and cervical paraspinals, application of deep pressure to tolerance for multiple 30 sec bouts. Pt reports improvement in sx.    Patient's condition has the potential to improve in response to therapy. Maximum improvement is yet to be obtained. The anticipated improvement is attainable and reasonable in a generally predictable time.  Patient reports she thinks she has irritated shoulders with recent increase in use for caregiver activities. She reports she has been trying to avoid overuse of R shoulder, but has ended up with RUE in pain-provoking positions when providing care for her spouse.  PT reinforces importance of HEP and body mechanics/activity modulation to help decrease pt RUE sx. Pt verbalizes understanding.      PT Short Term Goals - 06/25/21 1404       PT SHORT TERM GOAL #1   Title Patient will be independent in home exercise program to improve strength/mobility for better functional independence with ADLs.    Baseline 02/24/2021 Pt issued HEP; 5/19: HEP to be advanced, pt has been unable to perform HEP consistently d/t spouse surgery and son's wedding.; 7/7: pt reports she has not been performing HEP  "as often as I'm supposed to."    Time 6    Period Weeks    Status On-going    Target Date 08/06/21               PT Long Term Goals - 06/25/21 1359       PT LONG TERM GOAL #1   Title Patient will report a worst pain of 3/10  in last 7 days in her R shoulder to improve tolerance with ADLs and reduced symptoms with activities.    Baseline 02/24/2021: Pt reports worst pain as 10/10.; 5/18: 4/10 is worst pain; 7/7: pt reports worst pain 5/10    Time 12    Period Weeks    Status Partially Met    Target Date 07/29/21      PT LONG TERM GOAL #2   Title Patient will decrease Quick DASH scores by > 8 points demonstrating reduced self-reported upper extremity disability.    Baseline 02/24/2021: QuickDash Disability/symptom score 40, Work score 37.5; 5/18: Disability/symtpom 38.6, Work 43.75; 7/7: disability/sx score 50%, work 62.5%    Time 12    Period Weeks    Status On-going    Target Date 07/29/21      PT LONG TERM GOAL #3   Title Patient will increase FOTO score to equal to or greater than 60 to demonstrate statistically significant improvement in mobility and quality of life.    Baseline 02/24/2021: 55; 5/18: 60; 7/7: 56    Time 12    Period Weeks    Status Achieved      PT LONG TERM GOAL #4   Title Patient will increase BUE MMT scores by at least 1/2 point as to improve functional strength and endurance for overhead activities and ADLs such as driving and washing her hair.    Baseline 02/24/2021: L/R shoulder flexion 4/5, 3+/5; shoulder abduction 4/5 B; shoulder extension 4+5 B; shoulder IR/ER both grossly 4/5 B; elbow flexion/extension 5/5 for both B; wrist flexion/extension 5/5 for both B; 5/18: L shoulder is grossly 4+/5.  R shoulder flexion 3+/5; R shoulder abduction 4-/5; R shoulder IR/ER both 4-/5 (B); R elbow flexion 4/5,  elbow ext. 5/5 B, shoulder ext. 5/5 B; 7/7:  L shoulder grossly 4+/5, R shoulder 4/5 except shoulder flexion 4-/5, R shoulder IR/ER pain limited to 3/5    Time  12    Period Weeks    Status On-going    Target Date 07/29/21                   Plan - 06/26/21 1054     Clinical Impression Statement Goals reassessed for progress note. Pt with increases on QuickDashscores, indicating increased impairment of RUE. Pt also wtih slight decrease in FOTO score and slight increase in pain over past 7 days compared to previous assessment. However, these changes likely impacted due to pt recent reports of increased use of RUE for caregiver purposes where pt reports she did end up with RUE in pain-provoking positions, although she has been trying to decrease overuse at home. Pt reports she has not been performing HEP as often as she should. PT reinforces importance of HEP and body mechanics/activity modulation to help decrease pt RUE sx. Pt verbalizes understanding. Pt did see some improvements in RUE strength as well as some decreases d/t pain limitation.   Patient's condition has the potential to improve in response to therapy. Maximum improvement is yet to be obtained. The anticipated improvement is attainable and reasonable in a generally predictable time. Further instruction in lifting techniques/pain modulatory techniques to be provided future sessions. The pt will continue to benefit from further skilled PT to improve pain, and R UE strength and ROM in order to increase ease with ADLs and QOL.    Personal Factors and Comorbidities Comorbidity 1;Comorbidity 2;Comorbidity 3+;Past/Current Experience;Social Background;Time since onset of injury/illness/exacerbation;Transportation;Fitness;Other    Comorbidities Pertinent: recent hx of COVID19, HTN, hyperlipidemia, pre-diabetes, AFIB, CHF, depression, anxiety, OA, atypical migraine, asthma.    Examination-Activity Limitations Bathing;Bed Mobility;Hygiene/Grooming;Lift;Caring for Others;Reach Overhead;Carry;Dressing;Sleep;Other    Examination-Participation Restrictions Laundry;Cleaning;Driving;Community  Activity;Other;Yard Work;Shop;Meal Prep;Volunteer    Stability/Clinical Decision Making Stable/Uncomplicated    Rehab Potential Good    PT Frequency 2x / week    PT Duration 12 weeks    PT Treatment/Interventions ADLs/Self Care Home Management;Biofeedback;Cryotherapy;Electrical Stimulation;Moist Heat;Traction;Ultrasound;DME Instruction;Functional mobility training;Therapeutic activities;Therapeutic exercise;Neuromuscular re-education;Patient/family education;Orthotic Fit/Training;Manual techniques;Scar mobilization;Passive range of motion;Dry needling;Taping;Energy conservation;Joint Manipulations;Spinal Manipulations;Gait training    PT Next Visit Plan Progress shoulder strengthening exercises; cervical stretches within pain tolerance, shoulder stability exercises/closed kinetic chain, closed kinetic chain push-up plus, continue plan as previously indicated    PT Home Exercise Plan Issued HEP with handout: RTB rows, RTB shoulder ER pull-aparts, and upper trap stretch; shoulder perturbation exercises, ROM for shoulder IR within pain-free range, mobilizations/distraction to R shoulder, continue training correct lifting mechanics in order to reduce strain on RUE, lifting mechanics    Consulted and Agree with Plan of Care Patient             Patient will benefit from skilled therapeutic intervention in order to improve the following deficits and impairments:  Increased fascial restricitons, Improper body mechanics, Pain, Decreased mobility, Increased muscle spasms, Postural dysfunction, Decreased activity tolerance, Decreased endurance, Decreased range of motion, Decreased strength, Hypomobility, Impaired UE functional use, Impaired flexibility, Decreased coordination  Visit Diagnosis: Muscle weakness (generalized)  Chronic right shoulder pain  Other abnormalities of gait and mobility     Problem List Patient Active Problem List   Diagnosis Date Noted   Encounter for screening mammogram  for breast cancer 06/05/2021   Abdominal pain, right lateral 10/21/2020   Diarrhea 04/11/2020   Epigastric  pain 02/19/2020   Dark stools 02/19/2020   PAF (paroxysmal atrial fibrillation) ([place]) 02/05/2019   Shortness of breath 01/27/2019   Irregular surface of cornea 12/25/2018   HPV (human papilloma virus) infection 12/01/2016   Screening mammogram, encounter for 11/29/2016   Estrogen deficiency 11/29/2016   Encounter for routine gynecological examination 11/29/2016   Grade III hemorrhoids 10/06/2016   Tachycardia 08/14/2016   Hemorrhoids 08/13/2016   Atypical migraine 08/03/2016   CAD (coronary artery disease)    AAA (abdominal aortic aneurysm) (HCC)    Chronic combined systolic (congestive) and diastolic (congestive) heart failure ([place])    Prediabetes 02/11/2016   Generalized anxiety disorder 12/08/2015   Panic attacks 07/28/2015   Sensorineural hearing loss 03/27/2013   Vitamin D deficiency 07/03/2012   H/O aortic dissection 05/12/2012   Obesity (BMI 30.0-34.9) 11/21/2011   Routine general medical examination at a health care facility 09/20/2011   S/P AVR (aortic valve replacement) 01/07/2011   CORONARY ARTERY BYPASS GRAFT, HX OF 01/07/2011   Arthritis 12/20/2010   Back pain 12/15/2010   Dissection of thoracic aorta ([place]) 12/11/2010   H/O dissecting abdominal aortic aneurysm repair 08/24/2010   IRRITABLE BOWEL SYNDROME 09/09/2009   Obstructive sleep apnea 08/04/2009   External hemorrhoid 06/26/2009   Osteoporosis 01/09/2009   Hyperlipidemia 07/26/2007   Depression with anxiety 07/26/2007   Essential hypertension 07/26/2007   Allergic rhinitis 07/26/2007   Asthma 07/26/2007   GERD 07/26/2007   Osteoarthritis 07/26/2007   Ocular migraine 07/26/2007   Mild intermittent asthma without complication 15/72/6203   Ricard Dillon PT, DPT  06/26/2021, 11:04 AM  [place] MAIN [facility] SERVICES [address] [place], [place], [address] Phone:  [phone]   Fax:  [phone]  Name: Rachel Torres MRN: [id number] Date of Birth: Nov 17, 1956

## 2021-06-26 ENCOUNTER — Encounter: Payer: Self-pay | Admitting: Family Medicine

## 2021-06-26 ENCOUNTER — Ambulatory Visit (INDEPENDENT_AMBULATORY_CARE_PROVIDER_SITE_OTHER): Payer: Medicare HMO | Admitting: Psychologist

## 2021-06-26 DIAGNOSIS — F411 Generalized anxiety disorder: Secondary | ICD-10-CM | POA: Diagnosis not present

## 2021-06-27 ENCOUNTER — Other Ambulatory Visit (INDEPENDENT_AMBULATORY_CARE_PROVIDER_SITE_OTHER): Payer: Self-pay | Admitting: Family Medicine

## 2021-06-27 DIAGNOSIS — R7303 Prediabetes: Secondary | ICD-10-CM

## 2021-06-29 ENCOUNTER — Other Ambulatory Visit: Payer: Self-pay

## 2021-06-29 ENCOUNTER — Other Ambulatory Visit (INDEPENDENT_AMBULATORY_CARE_PROVIDER_SITE_OTHER): Payer: Self-pay | Admitting: Family Medicine

## 2021-06-29 ENCOUNTER — Ambulatory Visit: Payer: Medicare HMO

## 2021-06-29 DIAGNOSIS — M25511 Pain in right shoulder: Secondary | ICD-10-CM | POA: Diagnosis not present

## 2021-06-29 DIAGNOSIS — R293 Abnormal posture: Secondary | ICD-10-CM | POA: Diagnosis not present

## 2021-06-29 DIAGNOSIS — R2689 Other abnormalities of gait and mobility: Secondary | ICD-10-CM | POA: Diagnosis not present

## 2021-06-29 DIAGNOSIS — M5412 Radiculopathy, cervical region: Secondary | ICD-10-CM | POA: Diagnosis not present

## 2021-06-29 DIAGNOSIS — R278 Other lack of coordination: Secondary | ICD-10-CM | POA: Diagnosis not present

## 2021-06-29 DIAGNOSIS — M6281 Muscle weakness (generalized): Secondary | ICD-10-CM | POA: Diagnosis not present

## 2021-06-29 DIAGNOSIS — G8929 Other chronic pain: Secondary | ICD-10-CM

## 2021-06-29 DIAGNOSIS — M546 Pain in thoracic spine: Secondary | ICD-10-CM | POA: Diagnosis not present

## 2021-06-29 DIAGNOSIS — R7303 Prediabetes: Secondary | ICD-10-CM

## 2021-06-29 DIAGNOSIS — M542 Cervicalgia: Secondary | ICD-10-CM | POA: Diagnosis not present

## 2021-06-29 MED ORDER — VENLAFAXINE HCL ER 150 MG PO CP24: ORAL_CAPSULE | ORAL | 1 refills | Status: DC

## 2021-06-29 NOTE — Therapy (Signed)
[facility] MAIN REHAB SERVICES [address] [place], , [address] Phone: [phone]   Fax:  [phone]  Physical Therapy Treatment  Patient Details  Name: Rachel Torres MRN: [id number] Date of Birth: 01/06/1956 Referring Provider (PT): Corey, Evan S, MD   Encounter Date: 06/29/2021   PT End of Session - 06/29/21 1428     Visit Number 21    Number of Visits 41    Date for PT Re-Evaluation 07/29/21    Authorization Type Eval performed 02/24/2021 (recert for 04/21/2021); new recert for 07/29/2021    PT Start Time 1430    PT Stop Time 1514    PT Time Calculation (min) 44 min    Activity Tolerance Patient tolerated treatment well;No increased pain    Behavior During Therapy WFL for tasks assessed/performed             Past Medical History:  Diagnosis Date   AAA (abdominal aortic aneurysm) (HCC)    a. Chronic w/o evidence of aneurysmal dil on CTA 01/2016.   Alcohol abuse    Allergy    Anemia    Anxiety    CAD (coronary artery disease)    a. 08/2010 Torres/p CABG x 1 (VG->RCA) @ time of Ao dissection repair; b. 01/2016 Lexiscan MV: mid antsept/apical defect w/ ? peri-infarct ischemia-->likely attenuation-->Med Rx.   Chewing difficulty    Chronic combined systolic (congestive) and diastolic (congestive) heart failure (HCC)    a. 2012 EF 30-35%; b. 04/2015 EF 40-45%; c. 01/2016 Echo: Ef 55-65%, nl AoV bioprosthesis, nl RV, nl PASP.   Constipation    Depression    Edema of both lower extremities    Gallbladder sludge    GERD (gastroesophageal reflux disease)    Heart failure (HCC)    Heart valve problem    History of Bicuspid Aortic Valve    a. 08/2010 Torres/p AVR @ time of Ao dissection repair; b. 01/2016 Echo: Ef 55-65%, nl AoV bioprosthesis, nl RV, nl PASP.   Hx of repair of dissecting thoracic aortic aneurysm, Stanford type A    a. 08/2010 Torres/p repair with AVR and VG->RCA; b. 01/2016 CTA: stable appearance of Asc Thoracic Aortic graft.  Opacification of flase lumen of chronic abd Ao dissection w/ retrograde flow through lumbar arteries. No aneurysmal dil of flase lumen.   Hyperlipidemia    Hypertensive heart disease    IBS (irritable bowel syndrome)    Internal hemorrhoids    Kidney problem    Marfan syndrome    pt denies   Migraine    Obstructive sleep apnea    Osteoarthritis    Osteoporosis    Palpitations    Pneumonia    RA (rheumatoid arthritis) (HCC)    a. Followed by Dr. Beakman    Past Surgical History:  Procedure Laterality Date   ABDOMINAL AORTIC ANEURYSM REPAIR     CARDIAC CATHETERIZATION  2001   CESAREAN SECTION  1986 & 1991   CHOLECYSTECTOMY     COLONOSCOPY     ESOPHAGOGASTRODUODENOSCOPY (EGD) WITH PROPOFOL N/A 05/08/2020   Procedure: ESOPHAGOGASTRODUODENOSCOPY (EGD) WITH PROPOFOL;  Surgeon: Torres, Rachel B, MD;  Location: [facility] ENDOSCOPY;  Service: Endoscopy;  Laterality: N/A;   FRACTURE SURGERY Right    shoulder replacement   HEMORRHOID BANDING     ORIF DISTAL RADIUS FRACTURE Left    UPPER GASTROINTESTINAL ENDOSCOPY      There were no vitals filed for this visit.   Subjective Assessment - 06/29/21 1630       Subjective Patient reports she has been feeling better but still having shoulder pain when lifting objects.    Pertinent History Pt is a 65 y/o female presenting to therapy due to R shoulder pain. Pt had R shoulder replacement 12-13 years ago.  Pt is caregiver to her husband who has Parkinson'Torres as well as caregiver to her mother. She says shoulder pain started this past January as she was helping lift her husband and his assistive devices. She reports she has since stopped lifting him and his walker as they have caregivers who come to the home for 16 hours/week and can help. Pt is most concerned that her shoulder hurts when she drives and washes her hair. Her shoulder does not hurt constantly, but she states she has pain "the more I use it." She feels a catch and stabbing sensation in R  anterior shoulder that can travel into bicep and that sometimes the pain is a burning sensation. She sometimes has pain in L shoulder.  She rates her worst R shoulder pain as 10/10, best pain is 0/10. Her R shoulder pain is currently 1.5/10. She reports that rest, ice and naproxen help with pain relief. She denies redness/swelling/temp difference and N/T in UEs.  Patient has been seen by other therapists in this clinic previously for cervicalgia and back related injuries/pain. Pt PMH includes COVID19, upper GI stomach issues, aorta type A dissection (2011) with aortic valve replacement, bypass of R coronary artery, HTN, hyperlipidemia, pre-diabetes, AFIB, CHF, depression, anxiety, OA, GERD, atypical migraine, asthma, and allergic rhinitis.    Limitations Lifting;House hold activities;Other (comment)    How long can you sit comfortably? Not affected    How long can you stand comfortably? Not affected    How long can you walk comfortably? Pt reports she has pain only if she is carrying something    Diagnostic tests DG R shoulder 02/09/2021 per chart "Impression: Possible hardware loosening post total shoulder arthroplasty. No  evidence of acute fracture or dislocation"    Patient Stated Goals Be able to drive without pain    Currently in Pain? Yes    Pain Score 2     Pain Location Shoulder    Pain Orientation Right    Pain Descriptors / Indicators Aching    Pain Type Chronic pain    Pain Onset More than a month ago    Pain Frequency Intermittent                   TherEx   Supine: PVC pipe overhead raise 10x PVC pipe chest press 12x Attempted PNF pattern 2 and 1; painful terminated after 6x    Seated  RTB ER with towel between elbow and body for optimal alignment 12x RTB IR against PT resistance with towel between elbow and body 12x RTB RUE row 15x  Seated trunk extension over towel roll 12x with cue for chin tuck  Seated scap retraction 12x 3 second holds.  Bilateral shoulder  abduction 10x   Standing: Wall walk flexion 5x, cue for pain free range of motion Wall walk abduction 5x; very challenging for patient Modified wall pushups 10x with a plus    Manual: AP right shoulder mobilization 4x 15 second grade II mobilizations Inferior right shoulder mobilization 4x 15 second grade II mobilizations. Inferior distraction x 4 with 5 second holds Grade II mobilizations to thoracic spine for postural correction and alignment 5 seconds x2 reps each level J mobilization for decreased kyphotic posturing and improved alignment  2x 30 second holds     Trigger Point Dry Needling (TDN), unbilled Education performed with patient regarding potential benefit of TDN. Reviewed precautions and risks with patient. Reviewed special precautions/risks over lung fields which include pneumothorax. Reviewed signs and symptoms of pneumothorax and advised pt to go to ER immediately if these symptoms develop advise them of dry needling treatment. Extensive time spent with pt to ensure full understanding of TDN risks. Pt provided verbal consent to treatment. TDN performed to  with 0.25 x 40 single needle placements with local twitch response (LTR). Pistoning technique utilized. Improved pain-free motion following intervention. Body part: R bicep, middle delt, and R upper trap. X4 minutes       Pt educated throughout session about proper posture and technique with exercises. Improved exercise technique, movement at target joints, use of target muscles after min to mod verbal, visual, tactile cues    Patient is highly motivated throughout session despite having pain with cross body each and prolonged muscular recruitment. Her caregiver demands does result in some re-injury between sessions with distraction of shoulder reducing pain. Education on pain free strengthening techniques performed with patient verbalizing understanding. TDN tolerated well with on pain increase. The pt will benefit from  further skilled PT to improve pain, UE ROM and strength to increase QOL.                PT Education - 06/29/21 1428     Education Details exercise technique, body mechanics    Person(Torres) Educated Patient    Methods Explanation;Demonstration;Tactile cues;Verbal cues    Comprehension Verbalized understanding;Returned demonstration;Verbal cues required;Tactile cues required              PT Short Term Goals - 06/25/21 1404       PT SHORT TERM GOAL #1   Title Patient will be independent in home exercise program to improve strength/mobility for better functional independence with ADLs.    Baseline 02/24/2021 Pt issued HEP; 5/19: HEP to be advanced, pt has been unable to perform HEP consistently d/t spouse surgery and son'Torres wedding.; 7/7: pt reports she has not been performing HEP "as often as I'm supposed to."    Time 6    Period Weeks    Status On-going    Target Date 08/06/21               PT Long Term Goals - 06/25/21 1359       PT LONG TERM GOAL #1   Title Patient will report a worst pain of 3/10  in last 7 days in her R shoulder to improve tolerance with ADLs and reduced symptoms with activities.    Baseline 02/24/2021: Pt reports worst pain as 10/10.; 5/18: 4/10 is worst pain; 7/7: pt reports worst pain 5/10    Time 12    Period Weeks    Status Partially Met    Target Date 07/29/21      PT LONG TERM GOAL #2   Title Patient will decrease Quick DASH scores by > 8 points demonstrating reduced self-reported upper extremity disability.    Baseline 02/24/2021: QuickDash Disability/symptom score 40, Work score 37.5; 5/18: Disability/symtpom 38.6, Work 43.75; 7/7: disability/sx score 50%, work 62.5%    Time 12    Period Weeks    Status On-going    Target Date 07/29/21      PT LONG TERM GOAL #3   Title Patient will increase FOTO score to equal to or greater than 60 to demonstrate statistically   significant improvement in mobility and quality of life.    Baseline  02/24/2021: 55; 5/18: 60; 7/7: 56    Time 12    Period Weeks    Status Achieved      PT LONG TERM GOAL #4   Title Patient will increase BUE MMT scores by at least 1/2 point as to improve functional strength and endurance for overhead activities and ADLs such as driving and washing her hair.    Baseline 02/24/2021: L/R shoulder flexion 4/5, 3+/5; shoulder abduction 4/5 B; shoulder extension 4+5 B; shoulder IR/ER both grossly 4/5 B; elbow flexion/extension 5/5 for both B; wrist flexion/extension 5/5 for both B; 5/18: L shoulder is grossly 4+/5.  R shoulder flexion 3+/5; R shoulder abduction 4-/5; R shoulder IR/ER both 4-/5 (B); R elbow flexion 4/5, elbow ext. 5/5 B, shoulder ext. 5/5 B; 7/7:  L shoulder grossly 4+/5, R shoulder 4/5 except shoulder flexion 4-/5, R shoulder IR/ER pain limited to 3/5    Time 12    Period Weeks    Status On-going    Target Date 07/29/21                   Plan - 06/29/21 1633     Clinical Impression Statement Patient is highly motivated throughout session despite having pain with cross body each and prolonged muscular recruitment. Her caregiver demands does result in some re-injury between sessions with distraction of shoulder reducing pain. Education on pain free strengthening techniques performed with patient verbalizing understanding. TDN tolerated well with on pain increase. The pt will benefit from further skilled PT to improve pain, UE ROM and strength to increase QOL.    Personal Factors and Comorbidities Comorbidity 1;Comorbidity 2;Comorbidity 3+;Past/Current Experience;Social Background;Time since onset of injury/illness/exacerbation;Transportation;Fitness;Other    Comorbidities Pertinent: recent hx of COVID19, HTN, hyperlipidemia, pre-diabetes, AFIB, CHF, depression, anxiety, OA, atypical migraine, asthma.    Examination-Activity Limitations Bathing;Bed Mobility;Hygiene/Grooming;Lift;Caring for Others;Reach Overhead;Carry;Dressing;Sleep;Other     Examination-Participation Restrictions Laundry;Cleaning;Driving;Community Activity;Other;Yard Work;Shop;Meal Prep;Volunteer    Stability/Clinical Decision Making Stable/Uncomplicated    Rehab Potential Good    PT Frequency 2x / week    PT Duration 12 weeks    PT Treatment/Interventions ADLs/Self Care Home Management;Biofeedback;Cryotherapy;Electrical Stimulation;Moist Heat;Traction;Ultrasound;DME Instruction;Functional mobility training;Therapeutic activities;Therapeutic exercise;Neuromuscular re-education;Patient/family education;Orthotic Fit/Training;Manual techniques;Scar mobilization;Passive range of motion;Dry needling;Taping;Energy conservation;Joint Manipulations;Spinal Manipulations;Gait training    PT Next Visit Plan Progress shoulder strengthening exercises; cervical stretches within pain tolerance, shoulder stability exercises/closed kinetic chain, closed kinetic chain push-up plus, continue plan as previously indicated    PT Home Exercise Plan Issued HEP with handout: RTB rows, RTB shoulder ER pull-aparts, and upper trap stretch; shoulder perturbation exercises, ROM for shoulder IR within pain-free range, mobilizations/distraction to R shoulder, continue training correct lifting mechanics in order to reduce strain on RUE, lifting mechanics    Consulted and Agree with Plan of Care Patient             Patient will benefit from skilled therapeutic intervention in order to improve the following deficits and impairments:  Increased fascial restricitons, Improper body mechanics, Pain, Decreased mobility, Increased muscle spasms, Postural dysfunction, Decreased activity tolerance, Decreased endurance, Decreased range of motion, Decreased strength, Hypomobility, Impaired UE functional use, Impaired flexibility, Decreased coordination  Visit Diagnosis: Muscle weakness (generalized)  Chronic right shoulder pain  Other abnormalities of gait and mobility     Problem List Patient Active  Problem List   Diagnosis Date Noted   Encounter for screening mammogram for breast cancer 06/05/2021   Abdominal   pain, right lateral 10/21/2020   Diarrhea 04/11/2020   Epigastric pain 02/19/2020   Dark stools 02/19/2020   PAF (paroxysmal atrial fibrillation) ([place]) 02/05/2019   Shortness of breath 01/27/2019   Irregular surface of cornea 12/25/2018   HPV (human papilloma virus) infection 12/01/2016   Screening mammogram, encounter for 11/29/2016   Estrogen deficiency 11/29/2016   Encounter for routine gynecological examination 11/29/2016   Grade III hemorrhoids 10/06/2016   Tachycardia 08/14/2016   Hemorrhoids 08/13/2016   Atypical migraine 08/03/2016   CAD (coronary artery disease)    AAA (abdominal aortic aneurysm) (HCC)    Chronic combined systolic (congestive) and diastolic (congestive) heart failure ([place])    Prediabetes 02/11/2016   Generalized anxiety disorder 12/08/2015   Panic attacks 07/28/2015   Sensorineural hearing loss 03/27/2013   Vitamin D deficiency 07/03/2012   H/O aortic dissection 05/12/2012   Obesity (BMI 30.0-34.9) 11/21/2011   Routine general medical examination at a health care facility 09/20/2011   Torres/P AVR (aortic valve replacement) 01/07/2011   CORONARY ARTERY BYPASS GRAFT, HX OF 01/07/2011   Arthritis 12/20/2010   Back pain 12/15/2010   Dissection of thoracic aorta ([place]) 12/11/2010   H/O dissecting abdominal aortic aneurysm repair 08/24/2010   IRRITABLE BOWEL SYNDROME 09/09/2009   Obstructive sleep apnea 08/04/2009   External hemorrhoid 06/26/2009   Osteoporosis 01/09/2009   Hyperlipidemia 07/26/2007   Depression with anxiety 07/26/2007   Essential hypertension 07/26/2007   Allergic rhinitis 07/26/2007   Asthma 07/26/2007   GERD 07/26/2007   Osteoarthritis 07/26/2007   Ocular migraine 07/26/2007   Mild intermittent asthma without complication 40/98/1191   Janna Arch, PT, DPT  06/29/2021, 4:34 PM  [facility] MAIN [facility] SERVICES [address] [place], [place], [address] Phone: [phone]   Fax:  [phone]  Name: GABRIELL DAIGNEAULT MRN: [id number] Date of Birth: [birth year]

## 2021-06-29 NOTE — Telephone Encounter (Signed)
Pt last seen by Dr. Beasley.  

## 2021-06-29 NOTE — Progress Notes (Signed)
Chief Complaint:   OBESITY Monna is here to discuss her progress with her obesity treatment plan along with follow-up of her obesity related diagnoses. Valicia is on following a lower carbohydrate, vegetable and lean protein rich diet plan and states she is following her eating plan approximately 95% of the time. Rhaya states she is doing physical therapy for 45 minutes 2 times per week, and exercises for 10 minutes 5 times per week.  Today's visit was #: 15 Starting weight: 213 lbs Starting date: 05/05/2020 Today's weight: 174 lbs Today's date: 06/23/2021 Total lbs lost to date: 39 Total lbs lost since last in-office visit: 5  Interim History: Aimar is trying to decrease her simple carbohydrates and she is doing well with weight loss. She has some small deviations during stressful tims, but she gets back on track easily.  Subjective:   1. Other constipation Nashonda notes constipation has improved with miralax. She denies nausea or vomiting.  2. Pre-diabetes Starlette is working on diet and exercise, and she is tolerating Ozempic well with decreased polyphagia.  3. Vitamin D deficiency Laurinda is on OTC Vit D 5,000 IU daily, and her last Vit D level was at goal.  Assessment/Plan:   1. Other constipation Kadelyn was informed that a decrease in bowel movement frequency is normal while losing weight, but stools should not be hard or painful. We will refill miralax 17 g for 1 month with no refills. Orders and follow up as documented in patient record.   - polyethylene glycol powder (GLYCOLAX/MIRALAX) 17 GM/SCOOP powder; Take 17 g by mouth as needed.  Dispense: 255 g; Refill: 0  2. Pre-diabetes Jalexis will continue to work on weight loss, exercise, and decreasing simple carbohydrates to help decrease the risk of diabetes. We will check labs today, and we will refill Ozempic for 1 month.  - CMP14+EGFR - Insulin, random - Hemoglobin A1c - Semaglutide,0.25 or 0.5MG/DOS,  (OZEMPIC, 0.25 OR 0.5 MG/DOSE,) 2 MG/1.5ML SOPN; Inject 0.5 mg into the skin once a week.  Dispense: 1.5 mL; Refill: 0  3. Vitamin D deficiency Low Vitamin D level contributes to fatigue and are associated with obesity, breast, and colon cancer. We will check labs today. Djeneba will follow-up for routine testing of Vitamin D, at least 2-3 times per year to avoid over-replacement.  - VITAMIN D 25 Hydroxy (Vit-D Deficiency, Fractures)  4. Obesity with current BMI of 29.0 Shauniece is currently in the action stage of change. As such, her goal is to continue with weight loss efforts. She has agreed to following a lower carbohydrate, vegetable and lean protein rich diet plan.   Exercise goals: As is.  Behavioral modification strategies: increasing lean protein intake and meal planning and cooking strategies.  [name] has agreed to follow-up with our clinic in 4 weeks. She was informed of the importance of frequent follow-up visits to maximize her success with intensive lifestyle modifications for her multiple health conditions.   Vaishnavi was informed we would discuss her lab results at her next visit unless there is a critical issue that needs to be addressed sooner. Chasiti agreed to keep her next visit at the agreed upon time to discuss these results.  Objective:   Blood pressure 109/68, pulse 82, temperature (!) 97.4 F (36.3 C), height 5' 5" (1.651 m), weight 174 lb (78.9 kg), SpO2 97 %. Body mass index is 28.96 kg/m.  General: Cooperative, alert, well developed, in no acute distress. HEENT: Conjunctivae and lids unremarkable. Cardiovascular: Regular rhythm.  Lungs: Normal work of breathing. Neurologic: No focal deficits.   Lab Results  Component Value Date   CREATININE 0.91 02/26/2021   BUN 16 02/26/2021   NA 137 02/26/2021   K 4.7 02/26/2021   CL 100 02/26/2021   CO2 21 02/26/2021   Lab Results  Component Value Date   ALT 18 02/26/2021   AST 18 02/26/2021   ALKPHOS 75  02/26/2021   BILITOT 0.5 02/26/2021   Lab Results  Component Value Date   HGBA1C 5.5 02/26/2021   HGBA1C 5.7 (H) 10/29/2020   HGBA1C 5.6 07/23/2020   HGBA1C 5.9 (H) 05/05/2020   HGBA1C 6.0 06/07/2019   Lab Results  Component Value Date   INSULIN 17.3 02/26/2021   INSULIN 13.9 10/29/2020   INSULIN 22.5 07/23/2020   INSULIN 25.2 (H) 05/05/2020   Lab Results  Component Value Date   TSH 1.040 05/05/2020   Lab Results  Component Value Date   CHOL 219 (H) 02/26/2021   HDL 64 02/26/2021   LDLCALC 141 (H) 02/26/2021   LDLDIRECT 189.4 10/13/2011   TRIG 77 02/26/2021   CHOLHDL 3.4 02/26/2021   Lab Results  Component Value Date   VD25OH 62.2 02/26/2021   VD25OH 59.5 10/29/2020   VD25OH 60.7 07/23/2020   Lab Results  Component Value Date   WBC 6.0 10/21/2020   HGB 13.6 10/21/2020   HCT 40.3 10/21/2020   MCV 93.6 10/21/2020   PLT 288.0 10/21/2020   Lab Results  Component Value Date   FERRITIN 105.0 12/11/2010    Obesity Behavioral Intervention:   Approximately 15 minutes were spent on the discussion below.  ASK: We discussed the diagnosis of obesity with Jessey today and Walda agreed to give [place] permission to discuss obesity behavioral modification therapy today.  ASSESS: Freddie has the diagnosis of obesity and her BMI today is 28.96. Sonna is in the action stage of change.   ADVISE: Ivannah was educated on the multiple health risks of obesity as well as the benefit of weight loss to improve her health. She was advised of the need for long term treatment and the importance of lifestyle modifications to improve her current health and to decrease her risk of future health problems.  AGREE: Multiple dietary modification options and treatment options were discussed and Katyana agreed to follow the recommendations documented in the above note.  ARRANGE: Kahlyn was educated on the importance of frequent visits to treat obesity as outlined per CMS and USPSTF  guidelines and agreed to schedule her next follow up appointment today.  Attestation Statements:   Reviewed by clinician on day of visit: allergies, medications, problem list, medical history, surgical history, family history, social history, and previous encounter notes.   I, Trixie Dredge, am acting as transcriptionist for [name], MD.  I have reviewed the above documentation for accuracy and completeness, and I agree with the above. -  [name], MD

## 2021-07-01 ENCOUNTER — Ambulatory Visit: Payer: Medicare HMO

## 2021-07-01 ENCOUNTER — Other Ambulatory Visit: Payer: Self-pay

## 2021-07-01 DIAGNOSIS — M5412 Radiculopathy, cervical region: Secondary | ICD-10-CM | POA: Diagnosis not present

## 2021-07-01 DIAGNOSIS — M546 Pain in thoracic spine: Secondary | ICD-10-CM | POA: Diagnosis not present

## 2021-07-01 DIAGNOSIS — R293 Abnormal posture: Secondary | ICD-10-CM | POA: Diagnosis not present

## 2021-07-01 DIAGNOSIS — R2689 Other abnormalities of gait and mobility: Secondary | ICD-10-CM | POA: Diagnosis not present

## 2021-07-01 DIAGNOSIS — M6281 Muscle weakness (generalized): Secondary | ICD-10-CM

## 2021-07-01 DIAGNOSIS — G8929 Other chronic pain: Secondary | ICD-10-CM

## 2021-07-01 DIAGNOSIS — M542 Cervicalgia: Secondary | ICD-10-CM | POA: Diagnosis not present

## 2021-07-01 DIAGNOSIS — M25511 Pain in right shoulder: Secondary | ICD-10-CM

## 2021-07-01 DIAGNOSIS — R278 Other lack of coordination: Secondary | ICD-10-CM | POA: Diagnosis not present

## 2021-07-01 NOTE — Therapy (Signed)
[place] [facility] [address] [place], [place], [address] Phone: [phone]   Fax:  [phone]  Physical Therapy Treatment  Patient Details  Name: CAROLEE CHANNELL MRN: [id number] Date of Birth: 1956/12/14 Referring Provider (PT): Gregor Hams, MD   Encounter Date: 07/01/2021   PT End of Session - 07/01/21 6606     Visit Number 22    Number of Visits 41    Date for PT Re-Evaluation 07/29/21    Authorization Type Eval performed 3/0/1601 (recert for 0/08/3234); new recert for 5/73/2202    PT Start Time 1345    PT Stop Time 1430    PT Time Calculation (min) 45 min    Activity Tolerance Patient tolerated treatment well;No increased pain    Behavior During Therapy WFL for tasks assessed/performed             Past Medical History:  Diagnosis Date   AAA (abdominal aortic aneurysm) ([place])    a. Chronic w/o evidence of aneurysmal dil on CTA 01/2016.   Alcohol abuse    Allergy    Anemia    Anxiety    CAD (coronary artery disease)    a. 08/2010 s/p CABG x 1 (VG->RCA) @ time of Ao dissection repair; b. 01/2016 Lexiscan MV: mid antsept/apical defect w/ ? peri-infarct ischemia-->likely attenuation-->Med Rx.   Chewing difficulty    Chronic combined systolic (congestive) and diastolic (congestive) heart failure ([place])    a. 2012 EF 30-35%; b. 04/2015 EF 40-45%; c. 01/2016 Echo: Ef 55-65%, nl AoV bioprosthesis, nl RV, nl PASP.   Constipation    Depression    Edema of both lower extremities    Gallbladder sludge    GERD (gastroesophageal reflux disease)    Heart failure (HCC)    Heart valve problem    History of Bicuspid Aortic Valve    a. 08/2010 s/p AVR @ time of Ao dissection repair; b. 01/2016 Echo: Ef 55-65%, nl AoV bioprosthesis, nl RV, nl PASP.   Hx of repair of dissecting thoracic aortic aneurysm, [place] type A    a. 08/2010 s/p repair with AVR and VG->RCA; b. 01/2016 CTA: stable appearance of Asc Thoracic Aortic graft.  Opacification of flase lumen of chronic abd Ao dissection w/ retrograde flow through lumbar arteries. No aneurysmal dil of flase lumen.   Hyperlipidemia    Hypertensive heart disease    IBS (irritable bowel syndrome)    Internal hemorrhoids    Kidney problem    Marfan syndrome    pt denies   Migraine    Obstructive sleep apnea    Osteoarthritis    Osteoporosis    Palpitations    Pneumonia    RA (rheumatoid arthritis) ([place])    a. Followed by Dr. [name]    Past Surgical History:  Procedure Laterality Date   ABDOMINAL AORTIC ANEURYSM REPAIR     CARDIAC CATHETERIZATION  2001   [place]     ESOPHAGOGASTRODUODENOSCOPY (EGD) WITH PROPOFOL N/A 05/08/2020   Procedure: ESOPHAGOGASTRODUODENOSCOPY (EGD) WITH PROPOFOL;  Surgeon: Virgel Manifold, MD;  Location: [facility] ENDOSCOPY;  Service: Endoscopy;  Laterality: N/A;   FRACTURE SURGERY Right    shoulder replacement   HEMORRHOID BANDING     ORIF DISTAL RADIUS FRACTURE Left    UPPER GASTROINTESTINAL ENDOSCOPY      There were no vitals filed for this visit.   Subjective Assessment - 07/01/21 1436  Subjective Patient reports she went to a caregiver group earlier today. Is having some shoulder pain later at night but none at the moment.    Pertinent History Pt is a 65 y/o female presenting to therapy due to R shoulder pain. Pt had R shoulder replacement 12-13 years ago.  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 She says shoulder pain started this past January as she was helping lift her husband and his assistive devices. She reports she has since stopped lifting him and his walker as they have caregivers who come to the home for 16 hours/week and can help. Pt is most concerned that her shoulder hurts when she drives and washes her hair. Her shoulder does not hurt constantly, but she states she has pain "the more I use it." She feels a catch  and stabbing sensation in R anterior shoulder that can travel into bicep and that sometimes the pain is a burning sensation. She sometimes has pain in L shoulder.  She rates her worst R shoulder pain as 10/10, best pain is 0/10. Her R shoulder pain is currently 1.5/10. She reports that rest, ice and naproxen help with pain relief. She denies redness/swelling/temp difference and N/T in UEs.  Patient has been seen by other therapists in this clinic previously for cervicalgia and back related injuries/pain. Pt PMH includes COVID19, upper GI stomach issues, aorta type A dissection (2011) with aortic valve replacement, bypass of R coronary artery, HTN, hyperlipidemia, pre-diabetes, AFIB, CHF, depression, anxiety, OA, GERD, atypical migraine, asthma, and allergic rhinitis.    Limitations Lifting;House hold activities;Other (comment)    How long can you sit comfortably? Not affected    How long can you stand comfortably? Not affected    How long can you walk comfortably? Pt reports she has pain only if she is carrying something    Diagnostic tests DG R shoulder 02/09/2021 per chart "Impression: Possible hardware loosening post total shoulder arthroplasty. No  evidence of acute fracture or dislocation"    Patient Stated Goals Be able to drive without pain    Currently in Pain? No/denies                 TherEx   Supine: 5lb bar: chest press 15x with towel under R shoulder for positioning Scapular punches 15x with cue for scapular retraction Scapular stabilization against PT pertubation 45 seconds   Seated RTB ER with towel between elbow and body for optimal alignment 12x; x2 sets RTB IR against PT resistance with towel between elbow and body 12x; x2 sets RTB RUE row 15x  4lb bent over dumbbell row: 10x Bilateral shoulder abduction 10x  4lb tricep extension between bilateral hands 12x  Prone: Elbow/shoulder extension 15x  Manual: AP right shoulder mobilization 4x 15 second grade II  mobilizations Inferior right shoulder mobilization 4x 15 second grade II mobilizations. Inferior distraction x 4 with 5 second holds Grade II mobilizations to thoracic spine for postural correction and alignment 5 seconds x2 reps each level J mobilization for decreased kyphotic posturing and improved alignment 2x 30 second holds     Trigger Point Dry Needling (TDN), unbilled Education performed with patient regarding potential benefit of TDN. Reviewed precautions and risks with patient. Reviewed special precautions/risks over lung fields which include pneumothorax. Reviewed signs and symptoms of pneumothorax and advised pt to go to ER immediately if these symptoms develop advise them of dry needling treatment. Extensive time spent with pt to  ensure full understanding of TDN risks. Pt provided verbal consent to treatment. TDN performed to  with 0.25 x 40 single needle placements with local twitch response (LTR). Pistoning technique utilized. Improved pain-free motion following intervention. Body part: R bicep, middle delt, and R upper trap. X4 minutes       Pt educated throughout session about proper posture and technique with exercises. Improved exercise technique, movement at target joints, use of target muscles after min to mod verbal, visual, tactile cues      Patient tolerates stabilization and posterior chain activation well. She is limited with anterior chain recruitment with pain noted. She continues to be challenged with prolonged recruitment with increased discomfort. Postural interventions decreases pain. The pt will benefit from further skilled PT to improve pain, UE ROM and strength to increase QOL                    PT Education - 07/01/21 1436     Education Details body mechanics, posture    Person(s) Educated Patient    Methods Explanation;Demonstration;Tactile cues;Verbal cues    Comprehension Verbalized understanding;Returned demonstration;Verbal cues  required;Tactile cues required              PT Short Term Goals - 06/25/21 1404       PT SHORT TERM GOAL #1   Title Patient will be independent in home exercise program to improve strength/mobility for better functional independence with ADLs.    Baseline 02/24/2021 Pt issued HEP; 5/19: HEP to be advanced, pt has been unable to perform HEP consistently d/t spouse surgery and son's wedding.; 7/7: pt reports she has not been performing HEP "as often as I'm supposed to."    Time 6    Period Weeks    Status On-going    Target Date 08/06/21               PT Long Term Goals - 06/25/21 1359       PT LONG TERM GOAL #1   Title Patient will report a worst pain of 3/10  in last 7 days in her R shoulder to improve tolerance with ADLs and reduced symptoms with activities.    Baseline 02/24/2021: Pt reports worst pain as 10/10.; 5/18: 4/10 is worst pain; 7/7: pt reports worst pain 5/10    Time 12    Period Weeks    Status Partially Met    Target Date 07/29/21      PT LONG TERM GOAL #2   Title Patient will decrease Quick DASH scores by > 8 points demonstrating reduced self-reported upper extremity disability.    Baseline 02/24/2021: QuickDash Disability/symptom score 40, Work score 37.5; 5/18: Disability/symtpom 38.6, Work 43.75; 7/7: disability/sx score 50%, work 62.5%    Time 12    Period Weeks    Status On-going    Target Date 07/29/21      PT LONG TERM GOAL #3   Title Patient will increase FOTO score to equal to or greater than 60 to demonstrate statistically significant improvement in mobility and quality of life.    Baseline 02/24/2021: 55; 5/18: 60; 7/7: 56    Time 12    Period Weeks    Status Achieved      PT LONG TERM GOAL #4   Title Patient will increase BUE MMT scores by at least 1/2 point as to improve functional strength and endurance for overhead activities and ADLs such as driving and washing her hair.    Baseline 02/24/2021:  L/R shoulder flexion 4/5, 3+/5; shoulder  abduction 4/5 B; shoulder extension 4+5 B; shoulder IR/ER both grossly 4/5 B; elbow flexion/extension 5/5 for both B; wrist flexion/extension 5/5 for both B; 5/18: L shoulder is grossly 4+/5.  R shoulder flexion 3+/5; R shoulder abduction 4-/5; R shoulder IR/ER both 4-/5 (B); R elbow flexion 4/5, elbow ext. 5/5 B, shoulder ext. 5/5 B; 7/7:  L shoulder grossly 4+/5, R shoulder 4/5 except shoulder flexion 4-/5, R shoulder IR/ER pain limited to 3/5    Time 12    Period Weeks    Status On-going    Target Date 07/29/21                   Plan - 07/01/21 1438     Clinical Impression Statement Patient tolerates stabilization and posterior chain activation well. She is limited with anterior chain recruitment with pain noted. She continues to be challenged with prolonged recruitment with increased discomfort. Postural interventions decreases pain. The pt will benefit from further skilled PT to improve pain, UE ROM and strength to increase QOL    Personal Factors and Comorbidities Comorbidity 1;Comorbidity 2;Comorbidity 3+;Past/Current Experience;Social Background;Time since onset of injury/illness/exacerbation;Transportation;Fitness;Other    Comorbidities Pertinent: recent hx of COVID19, HTN, hyperlipidemia, pre-diabetes, AFIB, CHF, depression, anxiety, OA, atypical migraine, asthma.    Examination-Activity Limitations Bathing;Bed Mobility;Hygiene/Grooming;Lift;Caring for Others;Reach Overhead;Carry;Dressing;Sleep;Other    Examination-Participation Restrictions Laundry;Cleaning;Driving;Community Activity;Other;Yard Work;Shop;Meal Prep;Volunteer    Stability/Clinical Decision Making Stable/Uncomplicated    Rehab Potential Good    PT Frequency 2x / week    PT Duration 12 weeks    PT Treatment/Interventions ADLs/Self Care Home Management;Biofeedback;Cryotherapy;Electrical Stimulation;Moist Heat;Traction;Ultrasound;DME Instruction;Functional mobility training;Therapeutic activities;Therapeutic  exercise;Neuromuscular re-education;Patient/family education;Orthotic Fit/Training;Manual techniques;Scar mobilization;Passive range of motion;Dry needling;Taping;Energy conservation;Joint Manipulations;Spinal Manipulations;Gait training    PT Next Visit Plan Progress shoulder strengthening exercises; cervical stretches within pain tolerance, shoulder stability exercises/closed kinetic chain, closed kinetic chain push-up plus, continue plan as previously indicated    PT Home Exercise Plan Issued HEP with handout: RTB rows, RTB shoulder ER pull-aparts, and upper trap stretch; shoulder perturbation exercises, ROM for shoulder IR within pain-free range, mobilizations/distraction to R shoulder, continue training correct lifting mechanics in order to reduce strain on RUE, lifting mechanics    Consulted and Agree with Plan of Care Patient             Patient will benefit from skilled therapeutic intervention in order to improve the following deficits and impairments:  Increased fascial restricitons, Improper body mechanics, Pain, Decreased mobility, Increased muscle spasms, Postural dysfunction, Decreased activity tolerance, Decreased endurance, Decreased range of motion, Decreased strength, Hypomobility, Impaired UE functional use, Impaired flexibility, Decreased coordination  Visit Diagnosis: Muscle weakness (generalized)  Chronic right shoulder pain  Abnormal posture     Problem List Patient Active Problem List   Diagnosis Date Noted   Encounter for screening mammogram for breast cancer 06/05/2021   Abdominal pain, right lateral 10/21/2020   Diarrhea 04/11/2020   Epigastric pain 02/19/2020   Dark stools 02/19/2020   PAF (paroxysmal atrial fibrillation) ([place]) 02/05/2019   Shortness of breath 01/27/2019   Irregular surface of cornea 12/25/2018   HPV (human papilloma virus) infection 12/01/2016   Screening mammogram, encounter for 11/29/2016   Estrogen deficiency 11/29/2016   Encounter  for routine gynecological examination 11/29/2016   Grade III hemorrhoids 10/06/2016   Tachycardia 08/14/2016   Hemorrhoids 08/13/2016   Atypical migraine 08/03/2016   CAD (coronary artery disease)    AAA (abdominal aortic aneurysm) ([place])  Chronic combined systolic (congestive) and diastolic (congestive) heart failure (HCC)    Prediabetes 02/11/2016   Generalized anxiety disorder 12/08/2015   Panic attacks 07/28/2015   Sensorineural hearing loss 03/27/2013   Vitamin D deficiency 07/03/2012   H/O aortic dissection 05/12/2012   Obesity (BMI 30.0-34.9) 11/21/2011   Routine general medical examination at a health care facility 09/20/2011   S/P AVR (aortic valve replacement) 01/07/2011   CORONARY ARTERY BYPASS GRAFT, HX OF 01/07/2011   Arthritis 12/20/2010   Back pain 12/15/2010   Dissection of thoracic aorta ([place]) 12/11/2010   H/O dissecting abdominal aortic aneurysm repair 08/24/2010   IRRITABLE BOWEL SYNDROME 09/09/2009   Obstructive sleep apnea 08/04/2009   External hemorrhoid 06/26/2009   Osteoporosis 01/09/2009   Hyperlipidemia 07/26/2007   Depression with anxiety 07/26/2007   Essential hypertension 07/26/2007   Allergic rhinitis 07/26/2007   Asthma 07/26/2007   GERD 07/26/2007   Osteoarthritis 07/26/2007   Ocular migraine 07/26/2007   Mild intermittent asthma without complication 73/31/2508    Janna Arch, PT, DPT  07/01/2021, 2:39 PM  [place] [facility] [address] [place], [place], [address] Phone: [phone]   Fax:  [phone]  Name: LEYNA VANDERKOLK MRN: [id number] Date of Birth: 03-27-1956

## 2021-07-06 ENCOUNTER — Ambulatory Visit: Payer: Medicare HMO

## 2021-07-06 ENCOUNTER — Other Ambulatory Visit: Payer: Self-pay

## 2021-07-06 DIAGNOSIS — M546 Pain in thoracic spine: Secondary | ICD-10-CM | POA: Diagnosis not present

## 2021-07-06 DIAGNOSIS — R293 Abnormal posture: Secondary | ICD-10-CM

## 2021-07-06 DIAGNOSIS — M25511 Pain in right shoulder: Secondary | ICD-10-CM | POA: Diagnosis not present

## 2021-07-06 DIAGNOSIS — M542 Cervicalgia: Secondary | ICD-10-CM | POA: Diagnosis not present

## 2021-07-06 DIAGNOSIS — R278 Other lack of coordination: Secondary | ICD-10-CM | POA: Diagnosis not present

## 2021-07-06 DIAGNOSIS — M5412 Radiculopathy, cervical region: Secondary | ICD-10-CM | POA: Diagnosis not present

## 2021-07-06 DIAGNOSIS — G8929 Other chronic pain: Secondary | ICD-10-CM

## 2021-07-06 DIAGNOSIS — M6281 Muscle weakness (generalized): Secondary | ICD-10-CM

## 2021-07-06 DIAGNOSIS — R2689 Other abnormalities of gait and mobility: Secondary | ICD-10-CM | POA: Diagnosis not present

## 2021-07-06 NOTE — Therapy (Signed)
[place] MAIN [facility] SERVICES [address] [place], [place], [address] Phone: [phone]   Fax:  [phone]  Physical Therapy Treatment  Patient Details  Name: Rachel Torres MRN: [id number] Date of Birth: 1956-11-11 Referring Provider (PT): Gregor Hams, MD   Encounter Date: 07/06/2021   PT End of Session - 07/06/21 1418     Visit Number 23    Number of Visits 41    Date for PT Re-Evaluation 07/29/21    Authorization Type Eval performed 03/27/8890 (recert for 05/28/4502); new recert for 8/88/2800    PT Start Time 1430    PT Stop Time 3491    PT Time Calculation (min) 44 min    Activity Tolerance Patient tolerated treatment well;No increased pain    Behavior During Therapy WFL for tasks assessed/performed             Past Medical History:  Diagnosis Date   AAA (abdominal aortic aneurysm) ([place])    a. Chronic w/o evidence of aneurysmal dil on CTA 01/2016.   Alcohol abuse    Allergy    Anemia    Anxiety    CAD (coronary artery disease)    a. 08/2010 s/p CABG x 1 (VG->RCA) @ time of Ao dissection repair; b. 01/2016 Lexiscan MV: mid antsept/apical defect w/ ? peri-infarct ischemia-->likely attenuation-->Med Rx.   Chewing difficulty    Chronic combined systolic (congestive) and diastolic (congestive) heart failure ([place])    a. 2012 EF 30-35%; b. 04/2015 EF 40-45%; c. 01/2016 Echo: Ef 55-65%, nl AoV bioprosthesis, nl RV, nl PASP.   Constipation    Depression    Edema of both lower extremities    Gallbladder sludge    GERD (gastroesophageal reflux disease)    Heart failure (HCC)    Heart valve problem    History of Bicuspid Aortic Valve    a. 08/2010 s/p AVR @ time of Ao dissection repair; b. 01/2016 Echo: Ef 55-65%, nl AoV bioprosthesis, nl RV, nl PASP.   Hx of repair of dissecting thoracic aortic aneurysm, [place] type A    a. 08/2010 s/p repair with AVR and VG->RCA; b. 01/2016 CTA: stable appearance of Asc Thoracic Aortic graft.  Opacification of flase lumen of chronic abd Ao dissection w/ retrograde flow through lumbar arteries. No aneurysmal dil of flase lumen.   Hyperlipidemia    Hypertensive heart disease    IBS (irritable bowel syndrome)    Internal hemorrhoids    Kidney problem    Marfan syndrome    pt denies   Migraine    Obstructive sleep apnea    Osteoarthritis    Osteoporosis    Palpitations    Pneumonia    RA (rheumatoid arthritis) ([place])    a. Followed by Dr. [name]    Past Surgical History:  Procedure Laterality Date   ABDOMINAL AORTIC ANEURYSM REPAIR     CARDIAC CATHETERIZATION  2001   [place]     ESOPHAGOGASTRODUODENOSCOPY (EGD) WITH PROPOFOL N/A 05/08/2020   Procedure: ESOPHAGOGASTRODUODENOSCOPY (EGD) WITH PROPOFOL;  Surgeon: Virgel Manifold, MD;  Location: [facility] ENDOSCOPY;  Service: Endoscopy;  Laterality: N/A;   FRACTURE SURGERY Right    shoulder replacement   HEMORRHOID BANDING     ORIF DISTAL RADIUS FRACTURE Left    UPPER GASTROINTESTINAL ENDOSCOPY      There were no vitals filed for this visit.   Subjective Assessment - 07/06/21 1545  Subjective No pain reported today, highest pain since last seen is a 4/10 when picking up an item.    Pertinent History Pt is a 65 y/o female presenting to therapy due to R shoulder pain. Pt had R shoulder replacement 12-13 years ago.  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 She says shoulder pain started this past January as she was helping lift her husband and his assistive devices. She reports she has since stopped lifting him and his walker as they have caregivers who come to the home for 16 hours/week and can help. Pt is most concerned that her shoulder hurts when she drives and washes her hair. Her shoulder does not hurt constantly, but she states she has pain "the more I use it." She feels a catch and stabbing sensation in R anterior  shoulder that can travel into bicep and that sometimes the pain is a burning sensation. She sometimes has pain in L shoulder.  She rates her worst R shoulder pain as 10/10, best pain is 0/10. Her R shoulder pain is currently 1.5/10. She reports that rest, ice and naproxen help with pain relief. She denies redness/swelling/temp difference and N/T in UEs.  Patient has been seen by other therapists in this clinic previously for cervicalgia and back related injuries/pain. Pt PMH includes COVID19, upper GI stomach issues, aorta type A dissection (2011) with aortic valve replacement, bypass of R coronary artery, HTN, hyperlipidemia, pre-diabetes, AFIB, CHF, depression, anxiety, OA, GERD, atypical migraine, asthma, and allergic rhinitis.    Limitations Lifting;House hold activities;Other (comment)    How long can you sit comfortably? Not affected    How long can you stand comfortably? Not affected    How long can you walk comfortably? Pt reports she has pain only if she is carrying something    Diagnostic tests DG R shoulder 02/09/2021 per chart "Impression: Possible hardware loosening post total shoulder arthroplasty. No  evidence of acute fracture or dislocation"    Patient Stated Goals Be able to drive without pain    Currently in Pain? No/denies                 No pain reported today, highest pain since last seen is a 4/10 when picking up an item.    TherEx   Supine: 5lb bar: chest press 15x with towel under R shoulder for positioning Scapular punches 15x with cue for scapular retraction Scapular stabilization against PT perturbation 45 seconds RTB IR 15x against PT resistance RTB bicep curl 15x against PT resistance RTB tricep extension 15x against PT resistance   Seated RTB RUE row 15x  PVC pipe alternating row 10x each side  Bilateral shoulder abduction 10x  Scapular retraction 10x 3 second holds    Standing: Wall walk flexion 5x with cue for keeping arm in neutral  position Wall walk abduction 4x with cue for arc of motion    Manual: AP right shoulder mobilization 4x 15 second grade II mobilizations Inferior right shoulder mobilization 4x 15 second grade II mobilizations. Inferior distraction x 4 with 5 second holds Grade II mobilizations to thoracic spine for postural correction and alignment 5 seconds x2 reps each level J mobilization for decreased kyphotic posturing and improved alignment 2x 30 second holds   scapular depression with retraction 5x 20 second holds  Trigger Point Dry Needling (TDN), unbilled Education performed with patient regarding potential benefit of TDN. Reviewed precautions and risks with patient. Reviewed special  precautions/risks over lung fields which include pneumothorax. Reviewed signs and symptoms of pneumothorax and advised pt to go to ER immediately if these symptoms develop advise them of dry needling treatment. Extensive time spent with pt to ensure full understanding of TDN risks. Pt provided verbal consent to treatment. TDN performed to  with 0.25 x 40 single needle placements with local twitch response (LTR). Pistoning technique utilized. Improved pain-free motion following intervention. Body part: R bicep, middle delt, and R upper trap. X4 minutes       Pt educated throughout session about proper posture and technique with exercises. Improved exercise technique, movement at target joints, use of target muscles after min to mod verbal, visual, tactile cues    Patient tolerated manual and therex interventions well with only one point pain increase indicating improving strength and stabilization. Her scapula continues to require focused stabilization interventions at this time as well as postural corrections. The pt will benefit from further skilled PT to improve pain, UE ROM and strength to increase QOL                PT Education - 07/06/21 1417     Education Details exercise technique, manual, posture     Person(s) Educated Patient    Methods Explanation;Demonstration;Tactile cues;Verbal cues    Comprehension Verbalized understanding;Returned demonstration;Verbal cues required;Tactile cues required              PT Short Term Goals - 06/25/21 1404       PT SHORT TERM GOAL #1   Title Patient will be independent in home exercise program to improve strength/mobility for better functional independence with ADLs.    Baseline 02/24/2021 Pt issued HEP; 5/19: HEP to be advanced, pt has been unable to perform HEP consistently d/t spouse surgery and son's wedding.; 7/7: pt reports she has not been performing HEP "as often as I'm supposed to."    Time 6    Period Weeks    Status On-going    Target Date 08/06/21               PT Long Term Goals - 06/25/21 1359       PT LONG TERM GOAL #1   Title Patient will report a worst pain of 3/10  in last 7 days in her R shoulder to improve tolerance with ADLs and reduced symptoms with activities.    Baseline 02/24/2021: Pt reports worst pain as 10/10.; 5/18: 4/10 is worst pain; 7/7: pt reports worst pain 5/10    Time 12    Period Weeks    Status Partially Met    Target Date 07/29/21      PT LONG TERM GOAL #2   Title Patient will decrease Quick DASH scores by > 8 points demonstrating reduced self-reported upper extremity disability.    Baseline 02/24/2021: QuickDash Disability/symptom score 40, Work score 37.5; 5/18: Disability/symtpom 38.6, Work 43.75; 7/7: disability/sx score 50%, work 62.5%    Time 12    Period Weeks    Status On-going    Target Date 07/29/21      PT LONG TERM GOAL #3   Title Patient will increase FOTO score to equal to or greater than 60 to demonstrate statistically significant improvement in mobility and quality of life.    Baseline 02/24/2021: 55; 5/18: 60; 7/7: 56    Time 12    Period Weeks    Status Achieved      PT LONG TERM GOAL #4   Title Patient will  increase BUE MMT scores by at least 1/2 point as to improve  functional strength and endurance for overhead activities and ADLs such as driving and washing her hair.    Baseline 02/24/2021: L/R shoulder flexion 4/5, 3+/5; shoulder abduction 4/5 B; shoulder extension 4+5 B; shoulder IR/ER both grossly 4/5 B; elbow flexion/extension 5/5 for both B; wrist flexion/extension 5/5 for both B; 5/18: L shoulder is grossly 4+/5.  R shoulder flexion 3+/5; R shoulder abduction 4-/5; R shoulder IR/ER both 4-/5 (B); R elbow flexion 4/5, elbow ext. 5/5 B, shoulder ext. 5/5 B; 7/7:  L shoulder grossly 4+/5, R shoulder 4/5 except shoulder flexion 4-/5, R shoulder IR/ER pain limited to 3/5    Time 12    Period Weeks    Status On-going    Target Date 07/29/21                   Plan - 07/06/21 1546     Clinical Impression Statement Patient tolerated manual and therex interventions well with only one point pain increase indicating improving strength and stabilization. Her scapula continues to require focused stabilization interventions at this time as well as postural corrections. The pt will benefit from further skilled PT to improve pain, UE ROM and strength to increase QOL    Personal Factors and Comorbidities Comorbidity 1;Comorbidity 2;Comorbidity 3+;Past/Current Experience;Social Background;Time since onset of injury/illness/exacerbation;Transportation;Fitness;Other    Comorbidities Pertinent: recent hx of COVID19, HTN, hyperlipidemia, pre-diabetes, AFIB, CHF, depression, anxiety, OA, atypical migraine, asthma.    Examination-Activity Limitations Bathing;Bed Mobility;Hygiene/Grooming;Lift;Caring for Others;Reach Overhead;Carry;Dressing;Sleep;Other    Examination-Participation Restrictions Laundry;Cleaning;Driving;Community Activity;Other;Yard Work;Shop;Meal Prep;Volunteer    Stability/Clinical Decision Making Stable/Uncomplicated    Rehab Potential Good    PT Frequency 2x / week    PT Duration 12 weeks    PT Treatment/Interventions ADLs/Self Care Home  Management;Biofeedback;Cryotherapy;Electrical Stimulation;Moist Heat;Traction;Ultrasound;DME Instruction;Functional mobility training;Therapeutic activities;Therapeutic exercise;Neuromuscular re-education;Patient/family education;Orthotic Fit/Training;Manual techniques;Scar mobilization;Passive range of motion;Dry needling;Taping;Energy conservation;Joint Manipulations;Spinal Manipulations;Gait training    PT Next Visit Plan Progress shoulder strengthening exercises; cervical stretches within pain tolerance, shoulder stability exercises/closed kinetic chain, closed kinetic chain push-up plus, continue plan as previously indicated    PT Home Exercise Plan Issued HEP with handout: RTB rows, RTB shoulder ER pull-aparts, and upper trap stretch; shoulder perturbation exercises, ROM for shoulder IR within pain-free range, mobilizations/distraction to R shoulder, continue training correct lifting mechanics in order to reduce strain on RUE, lifting mechanics    Consulted and Agree with Plan of Care Patient             Patient will benefit from skilled therapeutic intervention in order to improve the following deficits and impairments:  Increased fascial restricitons, Improper body mechanics, Pain, Decreased mobility, Increased muscle spasms, Postural dysfunction, Decreased activity tolerance, Decreased endurance, Decreased range of motion, Decreased strength, Hypomobility, Impaired UE functional use, Impaired flexibility, Decreased coordination  Visit Diagnosis: Muscle weakness (generalized)  Chronic right shoulder pain  Abnormal posture     Problem List Patient Active Problem List   Diagnosis Date Noted   Encounter for screening mammogram for breast cancer 06/05/2021   Abdominal pain, right lateral 10/21/2020   Diarrhea 04/11/2020   Epigastric pain 02/19/2020   Dark stools 02/19/2020   PAF (paroxysmal atrial fibrillation) ([place]) 02/05/2019   Shortness of breath 01/27/2019   Irregular surface  of cornea 12/25/2018   HPV (human papilloma virus) infection 12/01/2016   Screening mammogram, encounter for 11/29/2016   Estrogen deficiency 11/29/2016   Encounter for routine gynecological examination 11/29/2016  Grade III hemorrhoids 10/06/2016   Tachycardia 08/14/2016   Hemorrhoids 08/13/2016   Atypical migraine 08/03/2016   CAD (coronary artery disease)    AAA (abdominal aortic aneurysm) (HCC)    Chronic combined systolic (congestive) and diastolic (congestive) heart failure ([place])    Prediabetes 02/11/2016   Generalized anxiety disorder 12/08/2015   Panic attacks 07/28/2015   Sensorineural hearing loss 03/27/2013   Vitamin D deficiency 07/03/2012   H/O aortic dissection 05/12/2012   Obesity (BMI 30.0-34.9) 11/21/2011   Routine general medical examination at a health care facility 09/20/2011   S/P AVR (aortic valve replacement) 01/07/2011   CORONARY ARTERY BYPASS GRAFT, HX OF 01/07/2011   Arthritis 12/20/2010   Back pain 12/15/2010   Dissection of thoracic aorta ([employer]) 12/11/2010   H/O dissecting abdominal aortic aneurysm repair 08/24/2010   IRRITABLE BOWEL SYNDROME 09/09/2009   Obstructive sleep apnea 08/04/2009   External hemorrhoid 06/26/2009   Osteoporosis 01/09/2009   Hyperlipidemia 07/26/2007   Depression with anxiety 07/26/2007   Essential hypertension 07/26/2007   Allergic rhinitis 07/26/2007   Asthma 07/26/2007   GERD 07/26/2007   Osteoarthritis 07/26/2007   Ocular migraine 07/26/2007   Mild intermittent asthma without complication 91/69/4503    Janna Arch, PT, DPT  07/06/2021, 3:47 PM  [place] MAIN [facility] SERVICES [address] [place], [place], [address] Phone: [phone]   Fax:  [phone]  Name: Rachel Torres MRN: [id number] Date of Birth: Oct 16, 1956

## 2021-07-08 ENCOUNTER — Other Ambulatory Visit (INDEPENDENT_AMBULATORY_CARE_PROVIDER_SITE_OTHER): Payer: Self-pay | Admitting: Family Medicine

## 2021-07-08 ENCOUNTER — Ambulatory Visit: Payer: Medicare HMO

## 2021-07-08 DIAGNOSIS — R7303 Prediabetes: Secondary | ICD-10-CM

## 2021-07-09 ENCOUNTER — Telehealth: Payer: Self-pay | Admitting: Family Medicine

## 2021-07-09 DIAGNOSIS — I10 Essential (primary) hypertension: Secondary | ICD-10-CM

## 2021-07-09 DIAGNOSIS — E7849 Other hyperlipidemia: Secondary | ICD-10-CM

## 2021-07-09 DIAGNOSIS — R7303 Prediabetes: Secondary | ICD-10-CM

## 2021-07-09 DIAGNOSIS — M810 Age-related osteoporosis without current pathological fracture: Secondary | ICD-10-CM

## 2021-07-09 DIAGNOSIS — E559 Vitamin D deficiency, unspecified: Secondary | ICD-10-CM

## 2021-07-09 NOTE — Telephone Encounter (Signed)
Will refill at 07/21/21 office visit

## 2021-07-09 NOTE — Telephone Encounter (Signed)
-----   Message from Cloyd Stagers, RT sent at 06/29/2021  8:33 AM EDT ----- Regarding: Lab Orders for Friday 7.22.2022 Please place lab orders for Friday 7.22.2022, office visit for physical on Friday 7.29.2022 Thank you, Dyke Maes RT(R)

## 2021-07-10 ENCOUNTER — Ambulatory Visit (INDEPENDENT_AMBULATORY_CARE_PROVIDER_SITE_OTHER): Payer: Medicare HMO | Admitting: Psychologist

## 2021-07-10 ENCOUNTER — Other Ambulatory Visit: Payer: Self-pay

## 2021-07-10 ENCOUNTER — Other Ambulatory Visit (INDEPENDENT_AMBULATORY_CARE_PROVIDER_SITE_OTHER): Payer: Medicare HMO

## 2021-07-10 DIAGNOSIS — I10 Essential (primary) hypertension: Secondary | ICD-10-CM

## 2021-07-10 DIAGNOSIS — R7303 Prediabetes: Secondary | ICD-10-CM

## 2021-07-10 DIAGNOSIS — E7849 Other hyperlipidemia: Secondary | ICD-10-CM

## 2021-07-10 DIAGNOSIS — F411 Generalized anxiety disorder: Secondary | ICD-10-CM

## 2021-07-10 DIAGNOSIS — E559 Vitamin D deficiency, unspecified: Secondary | ICD-10-CM

## 2021-07-10 LAB — CBC WITH DIFFERENTIAL/PLATELET
Basophils Absolute: 0 10*3/uL (ref 0.0–0.1)
Basophils Relative: 0.5 % (ref 0.0–3.0)
Eosinophils Absolute: 0.2 10*3/uL (ref 0.0–0.7)
Eosinophils Relative: 3.9 % (ref 0.0–5.0)
HCT: 41.3 % (ref 36.0–46.0)
Hemoglobin: 13.9 g/dL (ref 12.0–15.0)
Lymphocytes Relative: 34.5 % (ref 12.0–46.0)
Lymphs Abs: 1.9 10*3/uL (ref 0.7–4.0)
MCHC: 33.6 g/dL (ref 30.0–36.0)
MCV: 96.2 fl (ref 78.0–100.0)
Monocytes Absolute: 0.5 10*3/uL (ref 0.1–1.0)
Monocytes Relative: 9.4 % (ref 3.0–12.0)
Neutro Abs: 2.9 10*3/uL (ref 1.4–7.7)
Neutrophils Relative %: 51.7 % (ref 43.0–77.0)
Platelets: 274 10*3/uL (ref 150.0–400.0)
RBC: 4.29 Mil/uL (ref 3.87–5.11)
RDW: 13.1 % (ref 11.5–15.5)
WBC: 5.6 10*3/uL (ref 4.0–10.5)

## 2021-07-10 LAB — COMPREHENSIVE METABOLIC PANEL
ALT: 15 U/L (ref 0–35)
AST: 16 U/L (ref 0–37)
Albumin: 4.4 g/dL (ref 3.5–5.2)
Alkaline Phosphatase: 62 U/L (ref 39–117)
BUN: 14 mg/dL (ref 6–23)
CO2: 30 mEq/L (ref 19–32)
Calcium: 9.5 mg/dL (ref 8.4–10.5)
Chloride: 99 mEq/L (ref 96–112)
Creatinine, Ser: 0.89 mg/dL (ref 0.40–1.20)
GFR: 68.85 mL/min (ref >60.00)
Glucose, Bld: 95 mg/dL (ref 70–99)
Potassium: 4.8 mEq/L (ref 3.5–5.1)
Sodium: 135 mEq/L (ref 135–145)
Total Bilirubin: 0.6 mg/dL (ref 0.2–1.2)
Total Protein: 7.1 g/dL (ref 6.0–8.3)

## 2021-07-10 LAB — LIPID PANEL
Cholesterol: 166 mg/dL (ref 0–200)
HDL: 55.2 mg/dL (ref >39.00)
LDL Cholesterol: 89 mg/dL (ref 0–99)
NonHDL: 110.42
Total CHOL/HDL Ratio: 3
Triglycerides: 105 mg/dL (ref 0.0–149.0)
VLDL: 21 mg/dL (ref 0.0–40.0)

## 2021-07-10 LAB — HEMOGLOBIN A1C: Hgb A1c MFr Bld: 5.6 % (ref 4.6–6.5)

## 2021-07-10 LAB — TSH: TSH: 1.54 u[IU]/mL (ref 0.35–5.50)

## 2021-07-10 LAB — VITAMIN D 25 HYDROXY (VIT D DEFICIENCY, FRACTURES): VITD: 74.87 ng/mL (ref 30.00–100.00)

## 2021-07-13 ENCOUNTER — Ambulatory Visit: Payer: Medicare HMO | Admitting: Physical Therapy

## 2021-07-13 ENCOUNTER — Other Ambulatory Visit: Payer: Self-pay

## 2021-07-13 DIAGNOSIS — M546 Pain in thoracic spine: Secondary | ICD-10-CM

## 2021-07-13 DIAGNOSIS — M5412 Radiculopathy, cervical region: Secondary | ICD-10-CM

## 2021-07-13 DIAGNOSIS — R2689 Other abnormalities of gait and mobility: Secondary | ICD-10-CM

## 2021-07-13 DIAGNOSIS — R293 Abnormal posture: Secondary | ICD-10-CM | POA: Diagnosis not present

## 2021-07-13 DIAGNOSIS — R278 Other lack of coordination: Secondary | ICD-10-CM

## 2021-07-13 DIAGNOSIS — M6281 Muscle weakness (generalized): Secondary | ICD-10-CM | POA: Diagnosis not present

## 2021-07-13 DIAGNOSIS — G8929 Other chronic pain: Secondary | ICD-10-CM

## 2021-07-13 DIAGNOSIS — M542 Cervicalgia: Secondary | ICD-10-CM | POA: Diagnosis not present

## 2021-07-13 DIAGNOSIS — M25511 Pain in right shoulder: Secondary | ICD-10-CM | POA: Diagnosis not present

## 2021-07-13 NOTE — Therapy (Signed)
[place] MAIN [facility] SERVICES [address] [place], [place], [address] Phone: [phone]   Fax:  [phone]  Physical Therapy Treatment  Patient Details  Name: Rachel Torres MRN: [id number] Date of Birth: 07-Sep-1956 Referring Provider (PT): Gregor Hams, MD   Encounter Date: 07/13/2021   PT End of Session - 07/13/21 1752     Visit Number 24    Number of Visits 41    Date for PT Re-Evaluation 07/29/21    Authorization Type Eval performed 05/22/8465 (recert for 04/27/9356); new recert for 0/17/7939    PT Start Time 1430    PT Stop Time 0300    PT Time Calculation (min) 44 min    Activity Tolerance Patient tolerated treatment well;No increased pain    Behavior During Therapy WFL for tasks assessed/performed             Past Medical History:  Diagnosis Date   AAA (abdominal aortic aneurysm) ([place])    a. Chronic w/o evidence of aneurysmal dil on CTA 01/2016.   Alcohol abuse    Allergy    Anemia    Anxiety    CAD (coronary artery disease)    a. 08/2010 s/p CABG x 1 (VG->RCA) @ time of Ao dissection repair; b. 01/2016 Lexiscan MV: mid antsept/apical defect w/ ? peri-infarct ischemia-->likely attenuation-->Med Rx.   Chewing difficulty    Chronic combined systolic (congestive) and diastolic (congestive) heart failure ([place])    a. 2012 EF 30-35%; b. 04/2015 EF 40-45%; c. 01/2016 Echo: Ef 55-65%, nl AoV bioprosthesis, nl RV, nl PASP.   Constipation    Depression    Edema of both lower extremities    Gallbladder sludge    GERD (gastroesophageal reflux disease)    Heart failure (HCC)    Heart valve problem    History of Bicuspid Aortic Valve    a. 08/2010 s/p AVR @ time of Ao dissection repair; b. 01/2016 Echo: Ef 55-65%, nl AoV bioprosthesis, nl RV, nl PASP.   Hx of repair of dissecting thoracic aortic aneurysm, [place] type A    a. 08/2010 s/p repair with AVR and VG->RCA; b. 01/2016 CTA: stable appearance of Asc Thoracic Aortic graft.  Opacification of flase lumen of chronic abd Ao dissection w/ retrograde flow through lumbar arteries. No aneurysmal dil of flase lumen.   Hyperlipidemia    Hypertensive heart disease    IBS (irritable bowel syndrome)    Internal hemorrhoids    Kidney problem    Marfan syndrome    pt denies   Migraine    Obstructive sleep apnea    Osteoarthritis    Osteoporosis    Palpitations    Pneumonia    RA (rheumatoid arthritis) ([place])    a. Followed by Dr. [name]    Past Surgical History:  Procedure Laterality Date   ABDOMINAL AORTIC ANEURYSM REPAIR     CARDIAC CATHETERIZATION  2001   [place]     ESOPHAGOGASTRODUODENOSCOPY (EGD) WITH PROPOFOL N/A 05/08/2020   Procedure: ESOPHAGOGASTRODUODENOSCOPY (EGD) WITH PROPOFOL;  Surgeon: Virgel Manifold, MD;  Location: [facility] ENDOSCOPY;  Service: Endoscopy;  Laterality: N/A;   FRACTURE SURGERY Right    shoulder replacement   HEMORRHOID BANDING     ORIF DISTAL RADIUS FRACTURE Left    UPPER GASTROINTESTINAL ENDOSCOPY      There were no vitals filed for this visit.   Subjective Assessment - 07/13/21 1433  Subjective 1/10 pain today, highest pain since last seen is a 4/10. She states she has been "pampering" her shoulder, avoiding lifting with it. She states she is doing well today.    Pertinent History Pt is a 65 y/o female presenting to therapy due to R shoulder pain. Pt had R shoulder replacement 12-13 years ago.  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 She says shoulder pain started this past January as she was helping lift her husband and his assistive devices. She reports she has since stopped lifting him and his walker as they have caregivers who come to the home for 16 hours/week and can help. Pt is most concerned that her shoulder hurts when she drives and washes her hair. Her shoulder does not hurt constantly, but she states she has pain  "the more I use it." She feels a catch and stabbing sensation in R anterior shoulder that can travel into bicep and that sometimes the pain is a burning sensation. She sometimes has pain in L shoulder.  She rates her worst R shoulder pain as 10/10, best pain is 0/10. Her R shoulder pain is currently 1.5/10. She reports that rest, ice and naproxen help with pain relief. She denies redness/swelling/temp difference and N/T in UEs.  Patient has been seen by other therapists in this clinic previously for cervicalgia and back related injuries/pain. Pt PMH includes COVID19, upper GI stomach issues, aorta type A dissection (2011) with aortic valve replacement, bypass of R coronary artery, HTN, hyperlipidemia, pre-diabetes, AFIB, CHF, depression, anxiety, OA, GERD, atypical migraine, asthma, and allergic rhinitis.    Limitations Lifting;House hold activities;Other (comment)    How long can you sit comfortably? Not affected    How long can you stand comfortably? Not affected    How long can you walk comfortably? Pt reports she has pain only if she is carrying something    Diagnostic tests DG R shoulder 02/09/2021 per chart "Impression: Possible hardware loosening post total shoulder arthroplasty. No  evidence of acute fracture or dislocation"    Patient Stated Goals Be able to drive without pain    Currently in Pain? Yes    Pain Score 1     Pain Location Shoulder    Pain Orientation Right    Pain Descriptors / Indicators Aching                 TherEx   Seated: RTB RUE row 15x  Scapular retraction 10x 3 second holds Chest press with scapular protraction 12x RTB IR 15x against PT resistance YTB ER 15x against PT resistance RTB bicep curl 15x against PT resistance RTB tricep extension 15x against PT resistance   Standing: Stabilization - green ball on wall at 90* flexion and UE fully extended; vertical, horizontal and CW/CCW circular motions. 15x in each direction, no rest.   Manual: AP right  shoulder mobilization 3x 30 second grade II mobilizations Inferior right shoulder mobilization 3x 30 second grade II mobilizations. Inferior distraction x 5 with 5 second holds Grade II mobilizations to thoracic spine for postural correction and alignment 5 seconds x2 reps each level Scapular depression with retraction 5x 20 second holds STM using effleurage and ptrissage techniques to R upper trap, supraspinatus, infraspinatus and cervical paraspinals.     Pt educated throughout session about proper posture and technique with exercises. Improved exercise technique, movement at target joints, use of target muscles after min to mod verbal, visual, tactile cues  Patient motivated throughout today's session. She tolerated manual therapy well. When therex was initiated, she reported tightness in the right shoulder, periscapular and cervical regions. After STM, pt was able to perform strengthening exercises without a reported increase in pain. Scapular stabilization was performed using the wall; endurance was also incorporated in which the pt reported fatigue upon completion. Her scapula continues to require focused stabilization interventions at this time as well as postural corrections. The pt will benefit from further skilled PT to improve pain, UE ROM and strength to increase QOL.          PT Short Term Goals - 06/25/21 1404       PT SHORT TERM GOAL #1   Title Patient will be independent in home exercise program to improve strength/mobility for better functional independence with ADLs.    Baseline 02/24/2021 Pt issued HEP; 5/19: HEP to be advanced, pt has been unable to perform HEP consistently d/t spouse surgery and son's wedding.; 7/7: pt reports she has not been performing HEP "as often as I'm supposed to."    Time 6    Period Weeks    Status On-going    Target Date 08/06/21               PT Long Term Goals - 06/25/21 1359       PT LONG TERM GOAL #1   Title Patient  will report a worst pain of 3/10  in last 7 days in her R shoulder to improve tolerance with ADLs and reduced symptoms with activities.    Baseline 02/24/2021: Pt reports worst pain as 10/10.; 5/18: 4/10 is worst pain; 7/7: pt reports worst pain 5/10    Time 12    Period Weeks    Status Partially Met    Target Date 07/29/21      PT LONG TERM GOAL #2   Title Patient will decrease Quick DASH scores by > 8 points demonstrating reduced self-reported upper extremity disability.    Baseline 02/24/2021: QuickDash Disability/symptom score 40, Work score 37.5; 5/18: Disability/symtpom 38.6, Work 43.75; 7/7: disability/sx score 50%, work 62.5%    Time 12    Period Weeks    Status On-going    Target Date 07/29/21      PT LONG TERM GOAL #3   Title Patient will increase FOTO score to equal to or greater than 60 to demonstrate statistically significant improvement in mobility and quality of life.    Baseline 02/24/2021: 55; 5/18: 60; 7/7: 56    Time 12    Period Weeks    Status Achieved      PT LONG TERM GOAL #4   Title Patient will increase BUE MMT scores by at least 1/2 point as to improve functional strength and endurance for overhead activities and ADLs such as driving and washing her hair.    Baseline 02/24/2021: L/R shoulder flexion 4/5, 3+/5; shoulder abduction 4/5 B; shoulder extension 4+5 B; shoulder IR/ER both grossly 4/5 B; elbow flexion/extension 5/5 for both B; wrist flexion/extension 5/5 for both B; 5/18: L shoulder is grossly 4+/5.  R shoulder flexion 3+/5; R shoulder abduction 4-/5; R shoulder IR/ER both 4-/5 (B); R elbow flexion 4/5, elbow ext. 5/5 B, shoulder ext. 5/5 B; 7/7:  L shoulder grossly 4+/5, R shoulder 4/5 except shoulder flexion 4-/5, R shoulder IR/ER pain limited to 3/5    Time 12    Period Weeks    Status On-going    Target Date 07/29/21  Plan - 07/13/21 1752     Clinical Impression Statement Patient motivated throughout today's session. She  tolerated manual therapy well. When therex was initiated, she reported tightness in the right shoulder, periscapular and cervical regions. After STM, pt was able to perform strengthening exercises without a reported increase in pain. Scapular stabilization was performed using the wall; endurance was also incorporated in which the pt reported fatigue upon completion. Her scapula continues to require focused stabilization interventions at this time as well as postural corrections. The pt will benefit from further skilled PT to improve pain, UE ROM and strength to increase QOL.    Personal Factors and Comorbidities Comorbidity 1;Comorbidity 2;Comorbidity 3+;Past/Current Experience;Social Background;Time since onset of injury/illness/exacerbation;Transportation;Fitness;Other    Comorbidities Pertinent: recent hx of COVID19, HTN, hyperlipidemia, pre-diabetes, AFIB, CHF, depression, anxiety, OA, atypical migraine, asthma.    Examination-Activity Limitations Bathing;Bed Mobility;Hygiene/Grooming;Lift;Caring for Others;Reach Overhead;Carry;Dressing;Sleep;Other    Examination-Participation Restrictions Laundry;Cleaning;Driving;Community Activity;Other;Yard Work;Shop;Meal Prep;Volunteer    Stability/Clinical Decision Making Stable/Uncomplicated    Rehab Potential Good    PT Frequency 2x / week    PT Duration 12 weeks    PT Treatment/Interventions ADLs/Self Care Home Management;Biofeedback;Cryotherapy;Electrical Stimulation;Moist Heat;Traction;Ultrasound;DME Instruction;Functional mobility training;Therapeutic activities;Therapeutic exercise;Neuromuscular re-education;Patient/family education;Orthotic Fit/Training;Manual techniques;Scar mobilization;Passive range of motion;Dry needling;Taping;Energy conservation;Joint Manipulations;Spinal Manipulations;Gait training    PT Next Visit Plan Progress shoulder strengthening exercises; cervical stretches within pain tolerance, shoulder stability exercises/closed kinetic  chain, closed kinetic chain push-up plus, continue plan as previously indicated    PT Home Exercise Plan Issued HEP with handout: RTB rows, RTB shoulder ER pull-aparts, and upper trap stretch; shoulder perturbation exercises, ROM for shoulder IR within pain-free range, mobilizations/distraction to R shoulder, continue training correct lifting mechanics in order to reduce strain on RUE, lifting mechanics    Consulted and Agree with Plan of Care Patient             Patient will benefit from skilled therapeutic intervention in order to improve the following deficits and impairments:  Increased fascial restricitons, Improper body mechanics, Pain, Decreased mobility, Increased muscle spasms, Postural dysfunction, Decreased activity tolerance, Decreased endurance, Decreased range of motion, Decreased strength, Hypomobility, Impaired UE functional use, Impaired flexibility, Decreased coordination  Visit Diagnosis: Muscle weakness (generalized)  Chronic right shoulder pain  Abnormal posture  Other abnormalities of gait and mobility  Other lack of coordination  Cervicalgia  Pain in thoracic spine  Radiculopathy, cervical region     Problem List Patient Active Problem List   Diagnosis Date Noted   Encounter for screening mammogram for breast cancer 06/05/2021   Abdominal pain, right lateral 10/21/2020   Diarrhea 04/11/2020   Epigastric pain 02/19/2020   Dark stools 02/19/2020   PAF (paroxysmal atrial fibrillation) ([place]) 02/05/2019   Shortness of breath 01/27/2019   Irregular surface of cornea 12/25/2018   HPV (human papilloma virus) infection 12/01/2016   Screening mammogram, encounter for 11/29/2016   Estrogen deficiency 11/29/2016   Encounter for routine gynecological examination 11/29/2016   Grade III hemorrhoids 10/06/2016   Tachycardia 08/14/2016   Hemorrhoids 08/13/2016   Atypical migraine 08/03/2016   CAD (coronary artery disease)    AAA (abdominal aortic aneurysm)  (HCC)    Chronic combined systolic (congestive) and diastolic (congestive) heart failure ([place])    Prediabetes 02/11/2016   Generalized anxiety disorder 12/08/2015   Panic attacks 07/28/2015   Sensorineural hearing loss 03/27/2013   Vitamin D deficiency 07/03/2012   H/O aortic dissection 05/12/2012   Obesity (BMI 30.0-34.9) 11/21/2011   Routine general medical examination at a  health care facility 09/20/2011   S/P AVR (aortic valve replacement) 01/07/2011   CORONARY ARTERY BYPASS GRAFT, HX OF 01/07/2011   Arthritis 12/20/2010   Back pain 12/15/2010   Dissection of thoracic aorta ([place]) 12/11/2010   H/O dissecting abdominal aortic aneurysm repair 08/24/2010   IRRITABLE BOWEL SYNDROME 09/09/2009   Obstructive sleep apnea 08/04/2009   External hemorrhoid 06/26/2009   Osteoporosis 01/09/2009   Hyperlipidemia 07/26/2007   Depression with anxiety 07/26/2007   Essential hypertension 07/26/2007   Allergic rhinitis 07/26/2007   Asthma 07/26/2007   GERD 07/26/2007   Osteoarthritis 07/26/2007   Ocular migraine 07/26/2007   Mild intermittent asthma without complication 17/53/0104   Patrina Levering PT, DPT  [facility] 07/13/2021, 6:05 PM  [place] MAIN [facility] SERVICES [address] [place], [place], [address] Phone: [phone]   Fax:  [phone]  Name: Rachel Torres MRN: [id number] Date of Birth: [birth year]

## 2021-07-15 ENCOUNTER — Ambulatory Visit: Payer: Medicare HMO

## 2021-07-15 ENCOUNTER — Other Ambulatory Visit: Payer: Self-pay

## 2021-07-15 DIAGNOSIS — R293 Abnormal posture: Secondary | ICD-10-CM

## 2021-07-15 DIAGNOSIS — R2689 Other abnormalities of gait and mobility: Secondary | ICD-10-CM | POA: Diagnosis not present

## 2021-07-15 DIAGNOSIS — M542 Cervicalgia: Secondary | ICD-10-CM | POA: Diagnosis not present

## 2021-07-15 DIAGNOSIS — M6281 Muscle weakness (generalized): Secondary | ICD-10-CM

## 2021-07-15 DIAGNOSIS — R278 Other lack of coordination: Secondary | ICD-10-CM | POA: Diagnosis not present

## 2021-07-15 DIAGNOSIS — G8929 Other chronic pain: Secondary | ICD-10-CM

## 2021-07-15 DIAGNOSIS — M546 Pain in thoracic spine: Secondary | ICD-10-CM | POA: Diagnosis not present

## 2021-07-15 DIAGNOSIS — M25511 Pain in right shoulder: Secondary | ICD-10-CM | POA: Diagnosis not present

## 2021-07-15 DIAGNOSIS — M5412 Radiculopathy, cervical region: Secondary | ICD-10-CM | POA: Diagnosis not present

## 2021-07-15 NOTE — Therapy (Signed)
[place] MAIN [facility] SERVICES [address] [place], [place], [address] Phone: [phone]   Fax:  [phone]  Physical Therapy Treatment  Patient Details  Name: Rachel Torres MRN: [id number] Date of Birth: 06-27-1956 Referring Provider (PT): Gregor Hams, MD   Encounter Date: 07/15/2021   PT End of Session - 07/15/21 1452     Visit Number 25    Number of Visits 41    Date for PT Re-Evaluation 07/29/21    Authorization Type Eval performed 05/28/6294 (recert for 01/27/4131); new recert for 4/40/1027    PT Start Time 1345    PT Stop Time 1431    PT Time Calculation (min) 46 min    Activity Tolerance Patient tolerated treatment well;No increased pain    Behavior During Therapy WFL for tasks assessed/performed             Past Medical History:  Diagnosis Date   AAA (abdominal aortic aneurysm) ([place])    a. Chronic w/o evidence of aneurysmal dil on CTA 01/2016.   Alcohol abuse    Allergy    Anemia    Anxiety    CAD (coronary artery disease)    a. 08/2010 s/p CABG x 1 (VG->RCA) @ time of Ao dissection repair; b. 01/2016 Lexiscan MV: mid antsept/apical defect w/ ? peri-infarct ischemia-->likely attenuation-->Med Rx.   Chewing difficulty    Chronic combined systolic (congestive) and diastolic (congestive) heart failure ([place])    a. 2012 EF 30-35%; b. 04/2015 EF 40-45%; c. 01/2016 Echo: Ef 55-65%, nl AoV bioprosthesis, nl RV, nl PASP.   Constipation    Depression    Edema of both lower extremities    Gallbladder sludge    GERD (gastroesophageal reflux disease)    Heart failure (HCC)    Heart valve problem    History of Bicuspid Aortic Valve    a. 08/2010 s/p AVR @ time of Ao dissection repair; b. 01/2016 Echo: Ef 55-65%, nl AoV bioprosthesis, nl RV, nl PASP.   Hx of repair of dissecting thoracic aortic aneurysm, Stanford type A    a. 08/2010 s/p repair with AVR and VG->RCA; b. 01/2016 CTA: stable appearance of Asc Thoracic Aortic graft.  Opacification of flase lumen of chronic abd Ao dissection w/ retrograde flow through lumbar arteries. No aneurysmal dil of flase lumen.   Hyperlipidemia    Hypertensive heart disease    IBS (irritable bowel syndrome)    Internal hemorrhoids    Kidney problem    Marfan syndrome    pt denies   Migraine    Obstructive sleep apnea    Osteoarthritis    Osteoporosis    Palpitations    Pneumonia    RA (rheumatoid arthritis) ([place])    a. Followed by Dr. [name]    Past Surgical History:  Procedure Laterality Date   ABDOMINAL AORTIC ANEURYSM REPAIR     CARDIAC CATHETERIZATION  2001   [place]     ESOPHAGOGASTRODUODENOSCOPY (EGD) WITH PROPOFOL N/A 05/08/2020   Procedure: ESOPHAGOGASTRODUODENOSCOPY (EGD) WITH PROPOFOL;  Surgeon: Virgel Manifold, MD;  Location: [facility] ENDOSCOPY;  Service: Endoscopy;  Laterality: N/A;   FRACTURE SURGERY Right    shoulder replacement   HEMORRHOID BANDING     ORIF DISTAL RADIUS FRACTURE Left    UPPER GASTROINTESTINAL ENDOSCOPY      There were no vitals filed for this visit.   Subjective Assessment - 07/15/21 1443  Subjective Patient reports having upper trap pain since last session. Has been doing her HEP.    Pertinent History Pt is a 65 y/o female presenting to therapy due to R shoulder pain. Pt had R shoulder replacement 12-13 years ago.  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 She says shoulder pain started this past January as she was helping lift her husband and his assistive devices. She reports she has since stopped lifting him and his walker as they have caregivers who come to the home for 16 hours/week and can help. Pt is most concerned that her shoulder hurts when she drives and washes her hair. Her shoulder does not hurt constantly, but she states she has pain "the more I use it." She feels a catch and stabbing sensation in R anterior shoulder  that can travel into bicep and that sometimes the pain is a burning sensation. She sometimes has pain in L shoulder.  She rates her worst R shoulder pain as 10/10, best pain is 0/10. Her R shoulder pain is currently 1.5/10. She reports that rest, ice and naproxen help with pain relief. She denies redness/swelling/temp difference and N/T in UEs.  Patient has been seen by other therapists in this clinic previously for cervicalgia and back related injuries/pain. Pt PMH includes COVID19, upper GI stomach issues, aorta type A dissection (2011) with aortic valve replacement, bypass of R coronary artery, HTN, hyperlipidemia, pre-diabetes, AFIB, CHF, depression, anxiety, OA, GERD, atypical migraine, asthma, and allergic rhinitis.    Limitations Lifting;House hold activities;Other (comment)    How long can you sit comfortably? Not affected    How long can you stand comfortably? Not affected    How long can you walk comfortably? Pt reports she has pain only if she is carrying something    Diagnostic tests DG R shoulder 02/09/2021 per chart "Impression: Possible hardware loosening post total shoulder arthroplasty. No  evidence of acute fracture or dislocation"    Patient Stated Goals Be able to drive without pain    Currently in Pain? Yes    Pain Score 3     Pain Location Shoulder    Pain Orientation Right    Pain Descriptors / Indicators Aching    Pain Type Chronic pain    Pain Frequency Intermittent                  TherEx Supine: RTB ER 12x with RUE on towel Scapular retraction and punch 10x no pain; cue for eccentric control   Standing: Wall walk flexion 5x with cue for keeping arm in neutral position Wall walk abduction 4x with cue for arc of motion  Table top stretch 3x 30 second holds    Manual: AP right shoulder mobilization 3x 30 second grade II mobilizations Inferior right shoulder mobilization 3x 30 second grade II mobilizations. Inferior distraction x 5 with 5 second  holds Grade II mobilizations to thoracic spine for postural correction and alignment 5 seconds x2 reps each level J mobilization for decreased kyphotic posturing and improved alignment 2x 30 second holds   Scapular depression with retraction 5x 20 second holds      Trigger Point Dry Needling (TDN), unbilled Education performed with patient regarding potential benefit of TDN. Reviewed precautions and risks with patient. Reviewed special precautions/risks over lung fields which include pneumothorax. Reviewed signs and symptoms of pneumothorax and advised pt to go to ER immediately if these symptoms develop advise them of dry  needling treatment. Extensive time spent with pt to ensure full understanding of TDN risks. Pt provided verbal consent to treatment. TDN performed to  with 0.25 x 40 single needle placements with local twitch response (LTR). Pistoning technique utilized. Improved pain-free motion following intervention. Body part: R bicep, middle delt, and R upper trap  R subscap. X5 minutes       Pt educated throughout session about proper posture and technique with exercises. Improved exercise technique, movement at target joints, use of target muscles after min to mod verbal, visual, tactile cues    Patient presents with L upper rap pain that is reduced by end of session. She is highly motivated throughout session and benefited from focused scapular stabilization interventions. Patient has significant trigger points in upper trap and middle delt. The pt will benefit from further skilled PT to improve pain, UE ROM and strength to increase QOL                   PT Education - 07/15/21 1452     Education Details exercise technique, manual, TDN    Person(s) Educated Patient    Methods Explanation;Demonstration;Tactile cues;Verbal cues    Comprehension Verbalized understanding;Returned demonstration;Verbal cues required;Tactile cues required              PT Short Term  Goals - 06/25/21 1404       PT SHORT TERM GOAL #1   Title Patient will be independent in home exercise program to improve strength/mobility for better functional independence with ADLs.    Baseline 02/24/2021 Pt issued HEP; 5/19: HEP to be advanced, pt has been unable to perform HEP consistently d/t spouse surgery and son's wedding.; 7/7: pt reports she has not been performing HEP "as often as I'm supposed to."    Time 6    Period Weeks    Status On-going    Target Date 08/06/21               PT Long Term Goals - 06/25/21 1359       PT LONG TERM GOAL #1   Title Patient will report a worst pain of 3/10  in last 7 days in her R shoulder to improve tolerance with ADLs and reduced symptoms with activities.    Baseline 02/24/2021: Pt reports worst pain as 10/10.; 5/18: 4/10 is worst pain; 7/7: pt reports worst pain 5/10    Time 12    Period Weeks    Status Partially Met    Target Date 07/29/21      PT LONG TERM GOAL #2   Title Patient will decrease Quick DASH scores by > 8 points demonstrating reduced self-reported upper extremity disability.    Baseline 02/24/2021: QuickDash Disability/symptom score 40, Work score 37.5; 5/18: Disability/symtpom 38.6, Work 43.75; 7/7: disability/sx score 50%, work 62.5%    Time 12    Period Weeks    Status On-going    Target Date 07/29/21      PT LONG TERM GOAL #3   Title Patient will increase FOTO score to equal to or greater than 60 to demonstrate statistically significant improvement in mobility and quality of life.    Baseline 02/24/2021: 55; 5/18: 60; 7/7: 56    Time 12    Period Weeks    Status Achieved      PT LONG TERM GOAL #4   Title Patient will increase BUE MMT scores by at least 1/2 point as to improve functional strength and endurance for overhead activities  and ADLs such as driving and washing her hair.    Baseline 02/24/2021: L/R shoulder flexion 4/5, 3+/5; shoulder abduction 4/5 B; shoulder extension 4+5 B; shoulder IR/ER both grossly  4/5 B; elbow flexion/extension 5/5 for both B; wrist flexion/extension 5/5 for both B; 5/18: L shoulder is grossly 4+/5.  R shoulder flexion 3+/5; R shoulder abduction 4-/5; R shoulder IR/ER both 4-/5 (B); R elbow flexion 4/5, elbow ext. 5/5 B, shoulder ext. 5/5 B; 7/7:  L shoulder grossly 4+/5, R shoulder 4/5 except shoulder flexion 4-/5, R shoulder IR/ER pain limited to 3/5    Time 12    Period Weeks    Status On-going    Target Date 07/29/21                   Plan - 07/15/21 1500     Clinical Impression Statement Patient presents with L upper rap pain that is reduced by end of session. She is highly motivated throughout session and benefited from focused scapular stabilization interventions. Patient has significant trigger points in upper trap and middle delt. The pt will benefit from further skilled PT to improve pain, UE ROM and strength to increase QOL    Personal Factors and Comorbidities Comorbidity 1;Comorbidity 2;Comorbidity 3+;Past/Current Experience;Social Background;Time since onset of injury/illness/exacerbation;Transportation;Fitness;Other    Comorbidities Pertinent: recent hx of COVID19, HTN, hyperlipidemia, pre-diabetes, AFIB, CHF, depression, anxiety, OA, atypical migraine, asthma.    Examination-Activity Limitations Bathing;Bed Mobility;Hygiene/Grooming;Lift;Caring for Others;Reach Overhead;Carry;Dressing;Sleep;Other    Examination-Participation Restrictions Laundry;Cleaning;Driving;Community Activity;Other;Yard Work;Shop;Meal Prep;Volunteer    Stability/Clinical Decision Making Stable/Uncomplicated    Rehab Potential Good    PT Frequency 2x / week    PT Duration 12 weeks    PT Treatment/Interventions ADLs/Self Care Home Management;Biofeedback;Cryotherapy;Electrical Stimulation;Moist Heat;Traction;Ultrasound;DME Instruction;Functional mobility training;Therapeutic activities;Therapeutic exercise;Neuromuscular re-education;Patient/family education;Orthotic  Fit/Training;Manual techniques;Scar mobilization;Passive range of motion;Dry needling;Taping;Energy conservation;Joint Manipulations;Spinal Manipulations;Gait training    PT Next Visit Plan Progress shoulder strengthening exercises; cervical stretches within pain tolerance, shoulder stability exercises/closed kinetic chain, closed kinetic chain push-up plus, continue plan as previously indicated    PT Home Exercise Plan Issued HEP with handout: RTB rows, RTB shoulder ER pull-aparts, and upper trap stretch; shoulder perturbation exercises, ROM for shoulder IR within pain-free range, mobilizations/distraction to R shoulder, continue training correct lifting mechanics in order to reduce strain on RUE, lifting mechanics    Consulted and Agree with Plan of Care Patient             Patient will benefit from skilled therapeutic intervention in order to improve the following deficits and impairments:  Increased fascial restricitons, Improper body mechanics, Pain, Decreased mobility, Increased muscle spasms, Postural dysfunction, Decreased activity tolerance, Decreased endurance, Decreased range of motion, Decreased strength, Hypomobility, Impaired UE functional use, Impaired flexibility, Decreased coordination  Visit Diagnosis: Muscle weakness (generalized)  Chronic right shoulder pain  Abnormal posture     Problem List Patient Active Problem List   Diagnosis Date Noted   Encounter for screening mammogram for breast cancer 06/05/2021   Abdominal pain, right lateral 10/21/2020   Diarrhea 04/11/2020   Epigastric pain 02/19/2020   Dark stools 02/19/2020   PAF (paroxysmal atrial fibrillation) ([place]) 02/05/2019   Shortness of breath 01/27/2019   Irregular surface of cornea 12/25/2018   HPV (human papilloma virus) infection 12/01/2016   Screening mammogram, encounter for 11/29/2016   Estrogen deficiency 11/29/2016   Encounter for routine gynecological examination 11/29/2016   Grade III  hemorrhoids 10/06/2016   Tachycardia 08/14/2016   Hemorrhoids 08/13/2016   Atypical  migraine 08/03/2016   CAD (coronary artery disease)    AAA (abdominal aortic aneurysm) (HCC)    Chronic combined systolic (congestive) and diastolic (congestive) heart failure ([place])    Prediabetes 02/11/2016   Generalized anxiety disorder 12/08/2015   Panic attacks 07/28/2015   Sensorineural hearing loss 03/27/2013   Vitamin D deficiency 07/03/2012   H/O aortic dissection 05/12/2012   Obesity (BMI 30.0-34.9) 11/21/2011   Routine general medical examination at a health care facility 09/20/2011   S/P AVR (aortic valve replacement) 01/07/2011   CORONARY ARTERY BYPASS GRAFT, HX OF 01/07/2011   Arthritis 12/20/2010   Back pain 12/15/2010   Dissection of thoracic aorta ([place]) 12/11/2010   H/O dissecting abdominal aortic aneurysm repair 08/24/2010   IRRITABLE BOWEL SYNDROME 09/09/2009   Obstructive sleep apnea 08/04/2009   External hemorrhoid 06/26/2009   Osteoporosis 01/09/2009   Hyperlipidemia 07/26/2007   Depression with anxiety 07/26/2007   Essential hypertension 07/26/2007   Allergic rhinitis 07/26/2007   Asthma 07/26/2007   GERD 07/26/2007   Osteoarthritis 07/26/2007   Ocular migraine 07/26/2007   Mild intermittent asthma without complication 45/02/8881    Janna Arch, PT, DPT  07/15/2021, 3:02 PM  [place] MAIN [facility] SERVICES [address] [place], [place], [address] Phone: [phone]   Fax:  [phone]  Name: Rachel Torres MRN: [id number] Date of Birth: May 19, 1956

## 2021-07-17 ENCOUNTER — Other Ambulatory Visit: Payer: Self-pay

## 2021-07-17 ENCOUNTER — Encounter: Payer: Self-pay | Admitting: Family Medicine

## 2021-07-17 ENCOUNTER — Ambulatory Visit (INDEPENDENT_AMBULATORY_CARE_PROVIDER_SITE_OTHER): Payer: Medicare HMO | Admitting: Family Medicine

## 2021-07-17 VITALS — BP 112/78 | HR 84 | Temp 97.2°F | Ht 65.0 in | Wt 176.0 lb

## 2021-07-17 DIAGNOSIS — M810 Age-related osteoporosis without current pathological fracture: Secondary | ICD-10-CM | POA: Diagnosis not present

## 2021-07-17 DIAGNOSIS — Z0000 Encounter for general adult medical examination without abnormal findings: Secondary | ICD-10-CM | POA: Diagnosis not present

## 2021-07-17 DIAGNOSIS — J4520 Mild intermittent asthma, uncomplicated: Secondary | ICD-10-CM

## 2021-07-17 DIAGNOSIS — I7103 Dissection of thoracoabdominal aorta: Secondary | ICD-10-CM

## 2021-07-17 DIAGNOSIS — E7849 Other hyperlipidemia: Secondary | ICD-10-CM | POA: Diagnosis not present

## 2021-07-17 DIAGNOSIS — E559 Vitamin D deficiency, unspecified: Secondary | ICD-10-CM | POA: Diagnosis not present

## 2021-07-17 DIAGNOSIS — E2839 Other primary ovarian failure: Secondary | ICD-10-CM

## 2021-07-17 DIAGNOSIS — I5042 Chronic combined systolic (congestive) and diastolic (congestive) heart failure: Secondary | ICD-10-CM

## 2021-07-17 DIAGNOSIS — S0300XA Dislocation of jaw, unspecified side, initial encounter: Secondary | ICD-10-CM

## 2021-07-17 DIAGNOSIS — F418 Other specified anxiety disorders: Secondary | ICD-10-CM

## 2021-07-17 DIAGNOSIS — R7303 Prediabetes: Secondary | ICD-10-CM | POA: Diagnosis not present

## 2021-07-17 DIAGNOSIS — I10 Essential (primary) hypertension: Secondary | ICD-10-CM

## 2021-07-17 DIAGNOSIS — I480 Paroxysmal atrial fibrillation: Secondary | ICD-10-CM

## 2021-07-17 MED ORDER — ZOSTER VAC RECOMB ADJUVANTED 50 MCG/0.5ML IM SUSR: 0.5000 mL | Freq: Once | INTRAMUSCULAR | 0 refills | Status: AC

## 2021-07-17 MED ORDER — ALBUTEROL SULFATE HFA 108 (90 BASE) MCG/ACT IN AERS: 1.0000 | INHALATION_SPRAY | Freq: Four times a day (QID) | RESPIRATORY_TRACT | 3 refills | Status: DC | PRN

## 2021-07-17 NOTE — Patient Instructions (Addendum)
Get your mammogram when you can as well as bone density (both at [facility])   If you are interested in the new shingles vaccine (Shingrix) - call your local pharmacy to check on coverage and availability  If affordable, get on a wait list at your pharmacy to get the vaccine. I sent the px to your pharmacy  I placed referral for PT for your TMJ   Calm and Headspace are my favorite apps for sleep and meditation and mindfulness

## 2021-07-17 NOTE — Progress Notes (Signed)
Subjective:    Patient ID: Rachel Torres, female    DOB: [birth year], 65 y.o.   MRN: [id number]  This visit occurred during the SARS-CoV-2 public health emergency.  Safety protocols were in place, including screening questions prior to the visit, additional usage of staff PPE, and extensive cleaning of exam room while observing appropriate contact time as indicated for disinfecting solutions.   HPI Pt presents for amw and health mt visit with rev of chronic medical problems   I have personally reviewed the Medicare Annual Wellness questionnaire and have noted 1. The patient's medical and social history 2. Their use of alcohol, tobacco or illicit drugs 3. Their current medications and supplements 4. The patient's functional ability including ADL's, fall risks, home safety risks and hearing or visual             impairment. 5. Diet and physical activities 6. Evidence for depression or mood disorders  The patients weight, height, BMI have been recorded in the chart and visual acuity is per eye clinic.  I have made referrals, counseling and provided education to the patient based review of the above and I have provided the pt with a written personalized care plan for preventive services. Reviewed and updated provider list, see scanned forms.  See scanned forms.  Routine anticipatory guidance given to patient.  See health maintenance. Colon cancer screening colonoscopy 1/14  Breast cancer screening mammogram 3/18  ordered in June (she has not scheduled yet)  Self breast exam- no lumps  Flu vaccine 9/20-missed this year  Will get this year Covid immunized - with both booster  Tetanus vaccine  Td 2020 Pneumovax-08 Zoster vaccine-anxious to get that at [facility]  Dexa 2018  osteopenia  Intol to alendronate in the past  Falls- none Fractures- none Supplements-vit D  D level of 74 Exercise - PT for shoulder  Trying to get her husband to do some exercise with her /she cares for him  (he has parkinsons)   Advance directive-utd  Cognitive function addressed- see scanned forms- and if abnormal then additional documentation follows.  Had a bad migraine-some word finding with that-otherwise nl  Cares for husband and handles household   PMH and [place] reviewed  Meds, vitals, and allergies reviewed.   ROS: See HPI.  Otherwise negative.    Weight : Wt Readings from Last 3 Encounters:  07/17/21 176 lb (79.8 kg)  06/23/21 174 lb (78.9 kg)  06/05/21 180 lb (81.6 kg)   29.29 kg/m  Feeling pretty good   Hearing- no changes/pretty good  Passed screening  Had eye exam-up to date eye/vision care  No results found.   Care team Iliana Hutt-pcp [place]-pharmacy [facility]-cardiology  HTN bp is stable today  No cp or palpitations or headaches or edema  No side effects to medicines  BP Readings from Last 3 Encounters:  07/17/21 112/78  06/23/21 109/68  06/05/21 118/74    Carvedilol 6.25 mg bid Furosemide 20 mg daily  Lisinopril 5 mg daily   Mood  Depression anxiety  Takes xanax prn Effexor xr 150 mg daily   Hyperlipidemia Lab Results  Component Value Date   CHOL 166 07/10/2021   CHOL 219 (H) 02/26/2021   CHOL 215 (H) 10/29/2020   Lab Results  Component Value Date   HDL 55.20 07/10/2021   HDL 64 02/26/2021   HDL 65 10/29/2020   Lab Results  Component Value Date   LDLCALC 89 07/10/2021   LDLCALC 141 (H) 02/26/2021   LDLCALC 128 (  H) 10/29/2020   Lab Results  Component Value Date   TRIG 105.0 07/10/2021   TRIG 77 02/26/2021   TRIG 124 10/29/2020   Lab Results  Component Value Date   CHOLHDL 3 07/10/2021   CHOLHDL 3.4 02/26/2021   CHOLHDL 3 06/07/2019   Lab Results  Component Value Date   LDLDIRECT 189.4 10/13/2011   LDLDIRECT 163.3 05/05/2009   LDLDIRECT 146.4 01/09/2009   Atorvastatin 20 mg daily  Eating well/better overall  Improved !  Prediabetes Lab Results  Component Value Date   HGBA1C 5.6 07/10/2021   Stable Has worked on it   Very strong family hx     Other labs ok  Lab Results  Component Value Date   CREATININE 0.89 07/10/2021   BUN 14 07/10/2021   NA 135 07/10/2021   K 4.8 07/10/2021   CL 99 07/10/2021   CO2 30 07/10/2021   Lab Results  Component Value Date   ALT 15 07/10/2021   AST 16 07/10/2021   ALKPHOS 62 07/10/2021   BILITOT 0.6 07/10/2021   Lab Results  Component Value Date   WBC 5.6 07/10/2021   HGB 13.9 07/10/2021   HCT 41.3 07/10/2021   MCV 96.2 07/10/2021   PLT 274.0 07/10/2021   Lab Results  Component Value Date   TSH 1.54 07/10/2021    TMJ- opens jaw and it crackles  Wants to have PT -dry needling   She is concerned about ES syndrome Is hyper flexible  Wants eval ? If caused any heart problems  Was tested for marfan's   Sleep problems- uses melatonin  Sleeps and wakes up and cannot go back to sleep  Cannot get brain to stop  Xanax very rarely    Patient Active Problem List   Diagnosis Date Noted   Medicare annual wellness visit, initial 07/17/2021   TMJ (dislocation of temporomandibular joint) 07/17/2021   Encounter for screening mammogram for breast cancer 06/05/2021   Diarrhea 04/11/2020   Epigastric pain 02/19/2020   Dark stools 02/19/2020   PAF (paroxysmal atrial fibrillation) ([place]) 02/05/2019   Shortness of breath 01/27/2019   Irregular surface of cornea 12/25/2018   HPV (human papilloma virus) infection 12/01/2016   Screening mammogram, encounter for 11/29/2016   Estrogen deficiency 11/29/2016   Encounter for routine gynecological examination 11/29/2016   Grade III hemorrhoids 10/06/2016   Tachycardia 08/14/2016   Hemorrhoids 08/13/2016   Atypical migraine 08/03/2016   CAD (coronary artery disease)    AAA (abdominal aortic aneurysm) (HCC)    Chronic combined systolic (congestive) and diastolic (congestive) heart failure ([place])    Prediabetes 02/11/2016   Generalized anxiety disorder 12/08/2015   Panic attacks 07/28/2015   Sensorineural hearing  loss 03/27/2013   Vitamin D deficiency 07/03/2012   H/O aortic dissection 05/12/2012   Routine general medical examination at a health care facility 09/20/2011   S/P AVR (aortic valve replacement) 01/07/2011   CORONARY ARTERY BYPASS GRAFT, HX OF 01/07/2011   Arthritis 12/20/2010   Back pain 12/15/2010   Dissection of thoracic aorta ([place]) 12/11/2010   H/O dissecting abdominal aortic aneurysm repair 08/24/2010   IRRITABLE BOWEL SYNDROME 09/09/2009   Obstructive sleep apnea 08/04/2009   External hemorrhoid 06/26/2009   Osteoporosis 01/09/2009   Hyperlipidemia 07/26/2007   Depression with anxiety 07/26/2007   Essential hypertension 07/26/2007   Allergic rhinitis 07/26/2007   Asthma 07/26/2007   GERD 07/26/2007   Osteoarthritis 07/26/2007   Ocular migraine 07/26/2007   Mild intermittent asthma without complication [id number]  Past Medical History:  Diagnosis Date   AAA (abdominal aortic aneurysm) ([place])    a. Chronic w/o evidence of aneurysmal dil on CTA 01/2016.   Alcohol abuse    Allergy    Anemia    Anxiety    CAD (coronary artery disease)    a. 08/2010 s/p CABG x 1 (VG->RCA) @ time of Ao dissection repair; b. 01/2016 Lexiscan MV: mid antsept/apical defect w/ ? peri-infarct ischemia-->likely attenuation-->Med Rx.   Chewing difficulty    Chronic combined systolic (congestive) and diastolic (congestive) heart failure ([place])    a. 2012 EF 30-35%; b. 04/2015 EF 40-45%; c. 01/2016 Echo: Ef 55-65%, nl AoV bioprosthesis, nl RV, nl PASP.   Constipation    Depression    Edema of both lower extremities    Gallbladder sludge    GERD (gastroesophageal reflux disease)    Heart failure (HCC)    Heart valve problem    History of Bicuspid Aortic Valve    a. 08/2010 s/p AVR @ time of Ao dissection repair; b. 01/2016 Echo: Ef 55-65%, nl AoV bioprosthesis, nl RV, nl PASP.   Hx of repair of dissecting thoracic aortic aneurysm, [place] type A    a. 08/2010 s/p repair with AVR and VG->RCA; b. 01/2016  CTA: stable appearance of Asc Thoracic Aortic graft. Opacification of flase lumen of chronic abd Ao dissection w/ retrograde flow through lumbar arteries. No aneurysmal dil of flase lumen.   Hyperlipidemia    Hypertensive heart disease    IBS (irritable bowel syndrome)    Internal hemorrhoids    Kidney problem    Marfan syndrome    pt denies   Migraine    Obstructive sleep apnea    Osteoarthritis    Osteoporosis    Palpitations    Pneumonia    RA (rheumatoid arthritis) ([place])    a. Followed by Dr. [name]   Past Surgical History:  Procedure Laterality Date   ABDOMINAL AORTIC ANEURYSM REPAIR     CARDIAC CATHETERIZATION  2001   [place]     ESOPHAGOGASTRODUODENOSCOPY (EGD) WITH PROPOFOL N/A 05/08/2020   Procedure: ESOPHAGOGASTRODUODENOSCOPY (EGD) WITH PROPOFOL;  Surgeon: Virgel Manifold, MD;  Location: [facility] ENDOSCOPY;  Service: Endoscopy;  Laterality: N/A;   FRACTURE SURGERY Right    shoulder replacement   HEMORRHOID BANDING     ORIF DISTAL RADIUS FRACTURE Left    UPPER GASTROINTESTINAL ENDOSCOPY     Social History   Tobacco Use   Smoking status: Former    Packs/day: 0.75    Years: 1.50    Pack years: 1.13    Types: Cigarettes    Quit date: 12/20/1978    Years since quitting: 42.6   Smokeless tobacco: Never  Vaping Use   Vaping Use: Never used  Substance Use Topics   Alcohol use: Yes    Alcohol/week: 0.0 standard drinks    Comment: 2-3 glasses of red wine per week   Drug use: No   Family History  Problem Relation Age of Onset   Hypertension Mother    Depression Mother    Osteoporosis Mother    Stroke Mother    Irritable bowel syndrome Mother    Asthma Mother    Heart disease Mother    Thyroid disease Mother    Anxiety disorder Mother    Arthritis Sister    Diabetes Sister    Heart disease Sister    Asthma Sister  Migraines Sister    Nephrolithiasis Sister    Osteoporosis Sister    Arthritis  Sister    Asthma Sister    Migraines Sister    Other Father 26   Stroke Maternal Grandmother    Colon cancer Maternal Grandmother    Lymphoma Maternal Grandmother    Migraines Maternal Grandmother    Aneurysm Paternal Grandmother        brain   Diabetes Paternal Grandmother    Arthritis Paternal Grandmother    Arthritis Paternal Grandfather    Diabetes Paternal Grandfather    Other Brother        prediabeties   Asthma Brother    Migraines Brother    Lung cancer Maternal Grandfather    Melanoma Maternal Grandfather    Allergies  Allergen Reactions   Fosamax [Alendronate Sodium]     GI upset    Ginzing [Ginseng] Other (See Comments)    Hyper and jitery    Hydrocod Polst-Cpm Polst Er Itching   Hydrocodone-Acetaminophen Itching   Imitrex [Sumatriptan]     Contraindicated w/ heart condition   Oxycodone Itching   Peanut-Containing Drug Products Other (See Comments)    Reaction:  Migraines    Tramadol Other (See Comments)    itching   Hydrocodone-Guaifenesin Itching   Prednisone Rash   Tape Itching   Current Outpatient Medications on File Prior to Visit  Medication Sig Dispense Refill   acetaminophen (TYLENOL) 500 MG tablet Take 500 mg by mouth every 6 (six) hours as needed.     ALPRAZolam (XANAX) 0.5 MG tablet Take 1 tablet (0.5 mg total) by mouth daily as needed for anxiety. 30 tablet 0   aspirin EC 81 MG tablet Take 81 mg by mouth at bedtime.     atorvastatin (LIPITOR) 20 MG tablet TAKE 1 TABLET EVERY DAY 90 tablet 2   carvedilol (COREG) 6.25 MG tablet TAKE 1 TABLET TWICE DAILY WITH A MEAL 180 tablet 1   Cholecalciferol (VITAMIN D3) 5000 units CAPS Take 1 capsule by mouth daily.     cyclobenzaprine (FLEXERIL) 10 MG tablet Take 0.5-1 tablets (5-10 mg total) by mouth 3 (three) times daily as needed. For headache 90 tablet 1   furosemide (LASIX) 20 MG tablet TAKE 1 TABLET DAILY. MAY TAKE ADDITIONAL TAB IN AFTERNOON AS NEEDED FOR SWELLING, SHORTNESS OF BREATH, OR WEIGHT  GAIN. 180 tablet 0   Insulin Pen Needle (BD PEN NEEDLE NANO U/F) 32G X 4 MM MISC Use Nano needle with Ozempic 100 each 0   Krill Oil (OMEGA-3) 500 MG CAPS Take 1 capsule by mouth daily.     lisinopril (ZESTRIL) 5 MG tablet TAKE 1/2 TABLET EVERY DAY 45 tablet 2   magnesium gluconate (MAGONATE) 500 MG tablet Take 500 mg by mouth at bedtime.     Melatonin ([employer] MELATONIN) 5 MG SUBL Place under the tongue.     Misc Natural Products (TART CHERRY ADVANCED) CAPS Take 1 capsule by mouth daily.     naproxen sodium (ANAPROX) 220 MG tablet Take 440 mg by mouth 2 (two) times daily as needed (for headaches/pain).     nystatin (MYCOSTATIN/NYSTOP) powder Apply 1 application topically 2 (two) times daily. Apply to affected area twice a day for 14 days 60 g 0   omeprazole (PRILOSEC) 40 MG capsule Take 1 capsule (40 mg total) by mouth daily. 90 capsule 3   polyethylene glycol powder (GLYCOLAX/MIRALAX) 17 GM/SCOOP powder Take 17 g by mouth as needed. 255 g 0   potassium chloride (  KLOR-CON) 10 MEQ tablet TAKE 1 TABLET TWICE A DAY AS NEEDED. TAKE ADDITIONAL TABLET WHEN INCREASING FUROSEMIDE 270 tablet 1   promethazine (PHENERGAN) 25 MG tablet Take 0.5 tablets (12.5 mg total) by mouth every 8 (eight) hours as needed for nausea. 90 tablet 1   Semaglutide,0.25 or 0.'5MG'$ /DOS, (OZEMPIC, 0.25 OR 0.5 MG/DOSE,) 2 MG/1.5ML SOPN Inject 0.5 mg into the skin once a week. 1.5 mL 0   Simethicone (MYLANTA GAS PO) Take by mouth 3 times/day as needed-between meals & bedtime.     venlafaxine XR (EFFEXOR-XR) 150 MG 24 hr capsule TAKE 1 CAPSULE EVERY DAY WITH BREAKFAST 90 capsule 1   No current facility-administered medications on file prior to visit.    Review of Systems  Constitutional:  Negative for activity change, appetite change, fatigue, fever and unexpected weight change.  HENT:  Negative for congestion, ear pain, rhinorrhea, sinus pressure and sore throat.   Eyes:  Negative for pain, redness and visual disturbance.   Respiratory:  Positive for shortness of breath. Negative for cough and wheezing.   Cardiovascular:  Negative for chest pain, palpitations and leg swelling.  Gastrointestinal:  Negative for abdominal pain, blood in stool, constipation and diarrhea.  Endocrine: Negative for polydipsia and polyuria.  Genitourinary:  Negative for dysuria, frequency and urgency.  Musculoskeletal:  Positive for arthralgias. Negative for back pain and myalgias.       TMJ recently   Shoulder problems , in PT  Skin:  Negative for pallor and rash.  Allergic/Immunologic: Negative for environmental allergies.  Neurological:  Negative for dizziness, syncope and headaches.  Hematological:  Negative for adenopathy. Does not bruise/bleed easily.  Psychiatric/Behavioral:  Negative for decreased concentration and dysphoric mood. The patient is nervous/anxious.       Objective:   Physical Exam Constitutional:      General: She is not in acute distress.    Appearance: Normal appearance. She is well-developed. She is obese. She is not ill-appearing or diaphoretic.  HENT:     Head: Normocephalic and atraumatic.     Right Ear: Tympanic membrane, ear canal and external ear normal.     Left Ear: Tympanic membrane, ear canal and external ear normal.     Nose: Nose normal. No congestion.     Mouth/Throat:     Mouth: Mucous membranes are moist.     Pharynx: Oropharynx is clear. No posterior oropharyngeal erythema.  Eyes:     General: No scleral icterus.    Extraocular Movements: Extraocular movements intact.     Conjunctiva/sclera: Conjunctivae normal.     Pupils: Pupils are equal, round, and reactive to light.  Neck:     Thyroid: No thyromegaly.     Vascular: No carotid bruit or JVD.  Cardiovascular:     Rate and Rhythm: Normal rate and regular rhythm.     Pulses: Normal pulses.     Heart sounds: Normal heart sounds.    No gallop.  Pulmonary:     Effort: Pulmonary effort is normal. No respiratory distress.      Breath sounds: Normal breath sounds. No wheezing.     Comments: Good air exch Chest:     Chest wall: No tenderness.  Abdominal:     General: Bowel sounds are normal. There is no distension or abdominal bruit.     Palpations: Abdomen is soft. There is no mass.     Tenderness: There is no abdominal tenderness.     Hernia: No hernia is present.  Genitourinary:  Comments: Breast exam: No mass, nodules, thickening, tenderness, bulging, retraction, inflamation, nipple discharge or skin changes noted.  No axillary or clavicular LA.     Musculoskeletal:        General: No tenderness. Normal range of motion.     Cervical back: Normal range of motion and neck supple. No rigidity. No muscular tenderness.     Right lower leg: No edema.     Left lower leg: No edema.     Comments: No kyphosis   Clicking of TMJ -worse on L with opening jaw Not tender  Lymphadenopathy:     Cervical: No cervical adenopathy.  Skin:    General: Skin is warm and dry.     Coloration: Skin is not pale.     Findings: No erythema or rash.     Comments: Solar lentigines diffusely   Neurological:     Mental Status: She is alert. Mental status is at baseline.     Cranial Nerves: No cranial nerve deficit.     Motor: No abnormal muscle tone.     Coordination: Coordination normal.     Gait: Gait normal.     Deep Tendon Reflexes: Reflexes are normal and symmetric. Reflexes normal.  Psychiatric:        Mood and Affect: Mood normal.        Cognition and Memory: Cognition and memory normal.     Comments: Pleasant           Assessment & Plan:   Problem List Items Addressed This Visit       Cardiovascular and Mediastinum   Essential hypertension    bp in fair control at this time  BP Readings from Last 1 Encounters:  07/17/21 112/78  No changes needed Most recent labs reviewed  Disc lifstyle change with low sodium diet and exercise  In setting of CHF and under cardiology care  Plan to continue  Carvedilol  6.25 mg bid Furosemide 20 mg daily  Lisinopril 5 mg daily        Chronic combined systolic (congestive) and diastolic (congestive) heart failure (HCC)    Under care of cardiology Stable and doing well overall  Wt loss has helped  Continues carvedilol and furosemide and lisinopril Form filled out for handicapped plates as she is limited re: stamina        PAF (paroxysmal atrial fibrillation) ([place])    Not recent  In nl rhythm  Followed by cardiology  Taking carvedilol 6.25 mg bid         Respiratory   Asthma    No recent flares Keeps albuterol on hand -mdi        Relevant Medications   albuterol (VENTOLIN HFA) 108 (90 Base) MCG/ACT inhaler     Musculoskeletal and Integument   Osteoporosis    dexa 2018  New dexa ordered Intolerant of alendronate in the past No falls or fractures Taking vit D D level tx at 67 Some exercise-could do more         TMJ (dislocation of temporomandibular joint)    Pt has popping and pain in joint on and off  Wants to do PT for this with dry needling  Referral done          Other   Hyperlipidemia    Disc goals for lipids and reasons to control them Rev last labs with pt Rev low sat fat diet in detail In setting of CAD Plan to continue atorvastatin 20 mg daily  LDL is  improved         Depression with anxiety    Mood has been fairly good lately Continues effexor xr 150 mg daily  Xanax prn  Reviewed stressors/ coping techniques/symptoms/ support sources/ tx options and side effects in detail today Caring for husb with Parkinsons- challenging       Routine general medical examination at a health care facility    Reviewed health habits including diet and exercise and skin cancer prevention Reviewed appropriate screening tests for age  Also reviewed health mt list, fam hx and immunization status , as well as social and family history   See HPI Labs reviewed Colonoscopy utd  Mammogram and dexa ordered-pt will schedule   covid immunized shingrix px sent to her pharmacy No falls or fractures Advance directive is upd No cognitive concerns Mood is fairly good with current medications        Vitamin D deficiency    Vitamin D level is therapeutic with current supplementation Disc importance of this to bone and overall health Level of 74       Prediabetes    Lab Results  Component Value Date   HGBA1C 5.6 07/10/2021  Stable disc imp of low glycemic diet and wt loss to prevent DM2       Estrogen deficiency   Relevant Orders   DG Bone Density   Ambulatory referral to Physical Therapy   Medicare annual wellness visit, initial - Primary    Reviewed health habits including diet and exercise and skin cancer prevention Reviewed appropriate screening tests for age  Also reviewed health mt list, fam hx and immunization status , as well as social and family history   See HPI Labs reviewed Colonoscopy utd  Mammogram and dexa ordered-pt will schedule  covid immunized shingrix px sent to her pharmacy No falls or fractures Advance directive is upd No cognitive concerns Mood is fairly good with current medications        Other Visit Diagnoses     Dissection of thoracoabdominal aorta ([place])   (Chronic)

## 2021-07-18 DIAGNOSIS — E1169 Type 2 diabetes mellitus with other specified complication: Secondary | ICD-10-CM | POA: Insufficient documentation

## 2021-07-18 NOTE — Assessment & Plan Note (Signed)
Lab Results  Component Value Date   HGBA1C 5.6 07/10/2021   Stable disc imp of low glycemic diet and wt loss to prevent DM2

## 2021-07-18 NOTE — Assessment & Plan Note (Signed)
Pt has popping and pain in joint on and off  Wants to do PT for this with dry needling  Referral done

## 2021-07-18 NOTE — Assessment & Plan Note (Signed)
Not recent  In nl rhythm  Followed by cardiology  Taking carvedilol 6.25 mg bid

## 2021-07-18 NOTE — Assessment & Plan Note (Signed)
Vitamin D level is therapeutic with current supplementation Disc importance of this to bone and overall health Level of 74

## 2021-07-18 NOTE — Assessment & Plan Note (Signed)
Reviewed health habits including diet and exercise and skin cancer prevention Reviewed appropriate screening tests for age  Also reviewed health mt list, fam hx and immunization status , as well as social and family history   See HPI Labs reviewed Colonoscopy utd  Mammogram and dexa ordered-pt will schedule  covid immunized shingrix px sent to her pharmacy No falls or fractures Advance directive is upd No cognitive concerns Mood is fairly good with current medications

## 2021-07-18 NOTE — Assessment & Plan Note (Addendum)
Under care of cardiology Stable and doing well overall  Wt loss has helped  Continues carvedilol and furosemide and lisinopril Form filled out for handicapped plates as she is limited re: stamina

## 2021-07-18 NOTE — Assessment & Plan Note (Signed)
Disc goals for lipids and reasons to control them Rev last labs with pt Rev low sat fat diet in detail In setting of CAD Plan to continue atorvastatin 20 mg daily  LDL is improved

## 2021-07-18 NOTE — Assessment & Plan Note (Signed)
bp in fair control at this time  BP Readings from Last 1 Encounters:  07/17/21 112/78   No changes needed Most recent labs reviewed  Disc lifstyle change with low sodium diet and exercise  In setting of CHF and under cardiology care  Plan to continue  Carvedilol 6.25 mg bid Furosemide 20 mg daily  Lisinopril 5 mg daily

## 2021-07-18 NOTE — Assessment & Plan Note (Signed)
dexa 2018  New dexa ordered Intolerant of alendronate in the past No falls or fractures Taking vit D D level tx at 57 Some exercise-could do more

## 2021-07-18 NOTE — Assessment & Plan Note (Signed)
No recent flares Keeps albuterol on hand -mdi

## 2021-07-18 NOTE — Assessment & Plan Note (Signed)
Mood has been fairly good lately Continues effexor xr 150 mg daily  Xanax prn  Reviewed stressors/ coping techniques/symptoms/ support sources/ tx options and side effects in detail today Caring for husb with Parkinsons- challenging

## 2021-07-20 ENCOUNTER — Ambulatory Visit: Payer: Medicare HMO | Attending: Family Medicine

## 2021-07-20 ENCOUNTER — Other Ambulatory Visit: Payer: Self-pay

## 2021-07-20 DIAGNOSIS — M6281 Muscle weakness (generalized): Secondary | ICD-10-CM | POA: Diagnosis not present

## 2021-07-20 DIAGNOSIS — M542 Cervicalgia: Secondary | ICD-10-CM | POA: Diagnosis not present

## 2021-07-20 DIAGNOSIS — G8929 Other chronic pain: Secondary | ICD-10-CM | POA: Insufficient documentation

## 2021-07-20 DIAGNOSIS — R293 Abnormal posture: Secondary | ICD-10-CM | POA: Diagnosis not present

## 2021-07-20 DIAGNOSIS — M25511 Pain in right shoulder: Secondary | ICD-10-CM | POA: Diagnosis not present

## 2021-07-20 NOTE — Therapy (Signed)
[place] MAIN [name] SERVICES [address] [place], [place], [address] Phone: [phone]   Fax:  [phone]  Physical Therapy Treatment  Patient Details  Name: Rachel Torres MRN: [id number] Date of Birth: Jan 25, 1956 Referring Provider (PT): Gregor Hams, MD   Encounter Date: 07/20/2021   PT End of Session - 07/20/21 1639     Visit Number 26    Number of Visits 41    Date for PT Re-Evaluation 07/29/21    Authorization Type Eval performed 04/21/7481 (recert for 7/0/7867); new recert for 5/44/9201    PT Start Time 1430    PT Stop Time 0071    PT Time Calculation (min) 44 min    Activity Tolerance Patient tolerated treatment well;No increased pain    Behavior During Therapy WFL for tasks assessed/performed             Past Medical History:  Diagnosis Date   AAA (abdominal aortic aneurysm) ([place])    a. Chronic w/o evidence of aneurysmal dil on CTA 01/2016.   Alcohol abuse    Allergy    Anemia    Anxiety    CAD (coronary artery disease)    a. 08/2010 s/p CABG x 1 (VG->RCA) @ time of Ao dissection repair; b. 01/2016 Lexiscan MV: mid antsept/apical defect w/ ? peri-infarct ischemia-->likely attenuation-->Med Rx.   Chewing difficulty    Chronic combined systolic (congestive) and diastolic (congestive) heart failure ([place])    a. 2012 EF 30-35%; b. 04/2015 EF 40-45%; c. 01/2016 Echo: Ef 55-65%, nl AoV bioprosthesis, nl RV, nl PASP.   Constipation    Depression    Edema of both lower extremities    Gallbladder sludge    GERD (gastroesophageal reflux disease)    Heart failure (HCC)    Heart valve problem    History of Bicuspid Aortic Valve    a. 08/2010 s/p AVR @ time of Ao dissection repair; b. 01/2016 Echo: Ef 55-65%, nl AoV bioprosthesis, nl RV, nl PASP.   Hx of repair of dissecting thoracic aortic aneurysm, [place] type A    a. 08/2010 s/p repair with AVR and VG->RCA; b. 01/2016 CTA: stable appearance of Asc Thoracic Aortic graft.  Opacification of flase lumen of chronic abd Ao dissection w/ retrograde flow through lumbar arteries. No aneurysmal dil of flase lumen.   Hyperlipidemia    Hypertensive heart disease    IBS (irritable bowel syndrome)    Internal hemorrhoids    Kidney problem    Marfan syndrome    pt denies   Migraine    Obstructive sleep apnea    Osteoarthritis    Osteoporosis    Palpitations    Pneumonia    RA (rheumatoid arthritis) ([place])    a. Followed by Dr. [name]    Past Surgical History:  Procedure Laterality Date   ABDOMINAL AORTIC ANEURYSM REPAIR     CARDIAC CATHETERIZATION  2001   [place]     ESOPHAGOGASTRODUODENOSCOPY (EGD) WITH PROPOFOL N/A 05/08/2020   Procedure: ESOPHAGOGASTRODUODENOSCOPY (EGD) WITH PROPOFOL;  Surgeon: Virgel Manifold, MD;  Location: [facility] ENDOSCOPY;  Service: Endoscopy;  Laterality: N/A;   FRACTURE SURGERY Right    shoulder replacement   HEMORRHOID BANDING     ORIF DISTAL RADIUS FRACTURE Left    UPPER GASTROINTESTINAL ENDOSCOPY      There were no vitals filed for this visit.   Subjective Assessment - 07/20/21 1639  Subjective Patient reports compliance with HEP. Has  been lifting lightly with her arm being aware to be pain free.    Pertinent History Pt is a 66 y/o female presenting to therapy due to R shoulder pain. Pt had R shoulder replacement 12-13 years ago.  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 She says shoulder pain started this past January as she was helping lift her husband and his assistive devices. She reports she has since stopped lifting him and his walker as they have caregivers who come to the home for 16 hours/week and can help. Pt is most concerned that her shoulder hurts when she drives and washes her hair. Her shoulder does not hurt constantly, but she states she has pain "the more I use it." She feels a catch and stabbing sensation  in R anterior shoulder that can travel into bicep and that sometimes the pain is a burning sensation. She sometimes has pain in L shoulder.  She rates her worst R shoulder pain as 10/10, best pain is 0/10. Her R shoulder pain is currently 1.5/10. She reports that rest, ice and naproxen help with pain relief. She denies redness/swelling/temp difference and N/T in UEs.  Patient has been seen by other therapists in this clinic previously for cervicalgia and back related injuries/pain. Pt PMH includes COVID19, upper GI stomach issues, aorta type A dissection (2011) with aortic valve replacement, bypass of R coronary artery, HTN, hyperlipidemia, pre-diabetes, AFIB, CHF, depression, anxiety, OA, GERD, atypical migraine, asthma, and allergic rhinitis.    Limitations Lifting;House hold activities;Other (comment)    How long can you sit comfortably? Not affected    How long can you stand comfortably? Not affected    How long can you walk comfortably? Pt reports she has pain only if she is carrying something    Diagnostic tests DG R shoulder 02/09/2021 per chart "Impression: Possible hardware loosening post total shoulder arthroplasty. No  evidence of acute fracture or dislocation"    Patient Stated Goals Be able to drive without pain    Currently in Pain? Yes    Pain Score 2     Pain Location Shoulder    Pain Orientation Right    Pain Descriptors / Indicators Aching    Pain Type Chronic pain                 TherEx Supine: RTB scapular stabilization x 1 min Scapular retraction and punch 10x no pain; cue for eccentric control    Isometric with focus on 40-60% muscle activation 10x 5 second holds in the following directions: -flexion, abduction, adduction, and extension  Quadruped: Cat cow into child pose 8x with 15 second -30 second holds  Seated: YTB ER 15x with towel tucked RTB IR 15x with towel tucked between body and arm  YTB row 20x with focus on scapular retraction and depression.     Manual: AP right shoulder mobilization 3x 30 second grade II mobilizations Inferior right shoulder mobilization 3x 30 second grade II mobilizations. Inferior distraction x 5 with 5 second holds Grade II mobilizations to thoracic spine for postural correction and alignment 5 seconds x2 reps each level J mobilization for decreased kyphotic posturing and improved alignment 2x 30 second holds   Scapular depression with retraction 5x 20 second holds       Trigger Point Dry Needling (TDN), unbilled Education performed with patient regarding potential benefit of TDN. Reviewed precautions and risks with patient.  Reviewed special precautions/risks over lung fields which include pneumothorax. Reviewed signs and symptoms of pneumothorax and advised pt to go to ER immediately if these symptoms develop advise them of dry needling treatment. Extensive time spent with pt to ensure full understanding of TDN risks. Pt provided verbal consent to treatment. TDN performed to  with 0.25 x 40 single needle placements with local twitch response (LTR). Pistoning technique utilized. Improved pain-free motion following intervention. Body part: R bicep, middle delt, and R upper trap  R subscap. X 59mnutes       Pt educated throughout session about proper posture and technique with exercises. Improved exercise technique, movement at target joints, use of target muscles after min to mod verbal, visual, tactile cues  Patient tolerated therex and manual today with increased muscle activation in isometric contractions with pain free recruitment. She continues to be highly motivated for progression of care. Has a new referral for TMJ which will be addressed next session. Close chained interventions were non painful this session indicating progression of strengthening. The pt will benefit from further skilled PT to improve pain, UE ROM and strength to increase QOL                    PT Education - 07/20/21 1639      Education Details exercise technique, manual, TDN    Person(s) Educated Patient    Methods Explanation;Demonstration;Tactile cues;Verbal cues    Comprehension Verbalized understanding;Returned demonstration;Verbal cues required;Tactile cues required              PT Short Term Goals - 06/25/21 1404       PT SHORT TERM GOAL #1   Title Patient will be independent in home exercise program to improve strength/mobility for better functional independence with ADLs.    Baseline 02/24/2021 Pt issued HEP; 5/19: HEP to be advanced, pt has been unable to perform HEP consistently d/t spouse surgery and son's wedding.; 7/7: pt reports she has not been performing HEP "as often as I'm supposed to."    Time 6    Period Weeks    Status On-going    Target Date 08/06/21               PT Long Term Goals - 06/25/21 1359       PT LONG TERM GOAL #1   Title Patient will report a worst pain of 3/10  in last 7 days in her R shoulder to improve tolerance with ADLs and reduced symptoms with activities.    Baseline 02/24/2021: Pt reports worst pain as 10/10.; 5/18: 4/10 is worst pain; 7/7: pt reports worst pain 5/10    Time 12    Period Weeks    Status Partially Met    Target Date 07/29/21      PT LONG TERM GOAL #2   Title Patient will decrease Quick DASH scores by > 8 points demonstrating reduced self-reported upper extremity disability.    Baseline 02/24/2021: QuickDash Disability/symptom score 40, Work score 37.5; 5/18: Disability/symtpom 38.6, Work 43.75; 7/7: disability/sx score 50%, work 62.5%    Time 12    Period Weeks    Status On-going    Target Date 07/29/21      PT LONG TERM GOAL #3   Title Patient will increase FOTO score to equal to or greater than 60 to demonstrate statistically significant improvement in mobility and quality of life.    Baseline 02/24/2021: 55; 5/18: 60; 7/7: 56    Time 12  Period Weeks    Status Achieved      PT LONG TERM GOAL #4   Title Patient will increase  BUE MMT scores by at least 1/2 point as to improve functional strength and endurance for overhead activities and ADLs such as driving and washing her hair.    Baseline 02/24/2021: L/R shoulder flexion 4/5, 3+/5; shoulder abduction 4/5 B; shoulder extension 4+5 B; shoulder IR/ER both grossly 4/5 B; elbow flexion/extension 5/5 for both B; wrist flexion/extension 5/5 for both B; 5/18: L shoulder is grossly 4+/5.  R shoulder flexion 3+/5; R shoulder abduction 4-/5; R shoulder IR/ER both 4-/5 (B); R elbow flexion 4/5, elbow ext. 5/5 B, shoulder ext. 5/5 B; 7/7:  L shoulder grossly 4+/5, R shoulder 4/5 except shoulder flexion 4-/5, R shoulder IR/ER pain limited to 3/5    Time 12    Period Weeks    Status On-going    Target Date 07/29/21                   Plan - 07/20/21 1643     Clinical Impression Statement Patient tolerated therex and manual today with increased muscle activation in isometric contractions with pain free recruitment. She continues to be highly motivated for progression of care. Has a new referral for TMJ which will be addressed next session. Close chained interventions were non painful this session indicating progression of strengthening. The pt will benefit from further skilled PT to improve pain, UE ROM and strength to increase QOL    Personal Factors and Comorbidities Comorbidity 1;Comorbidity 2;Comorbidity 3+;Past/Current Experience;Social Background;Time since onset of injury/illness/exacerbation;Transportation;Fitness;Other    Comorbidities Pertinent: recent hx of COVID19, HTN, hyperlipidemia, pre-diabetes, AFIB, CHF, depression, anxiety, OA, atypical migraine, asthma.    Examination-Activity Limitations Bathing;Bed Mobility;Hygiene/Grooming;Lift;Caring for Others;Reach Overhead;Carry;Dressing;Sleep;Other    Examination-Participation Restrictions Laundry;Cleaning;Driving;Community Activity;Other;Yard Work;Shop;Meal Prep;Volunteer    Stability/Clinical Decision Making  Stable/Uncomplicated    Rehab Potential Good    PT Frequency 2x / week    PT Duration 12 weeks    PT Treatment/Interventions ADLs/Self Care Home Management;Biofeedback;Cryotherapy;Electrical Stimulation;Moist Heat;Traction;Ultrasound;DME Instruction;Functional mobility training;Therapeutic activities;Therapeutic exercise;Neuromuscular re-education;Patient/family education;Orthotic Fit/Training;Manual techniques;Scar mobilization;Passive range of motion;Dry needling;Taping;Energy conservation;Joint Manipulations;Spinal Manipulations;Gait training    PT Next Visit Plan Progress shoulder strengthening exercises; cervical stretches within pain tolerance, shoulder stability exercises/closed kinetic chain, closed kinetic chain push-up plus, continue plan as previously indicated    PT Home Exercise Plan Issued HEP with handout: RTB rows, RTB shoulder ER pull-aparts, and upper trap stretch; shoulder perturbation exercises, ROM for shoulder IR within pain-free range, mobilizations/distraction to R shoulder, continue training correct lifting mechanics in order to reduce strain on RUE, lifting mechanics    Consulted and Agree with Plan of Care Patient             Patient will benefit from skilled therapeutic intervention in order to improve the following deficits and impairments:  Increased fascial restricitons, Improper body mechanics, Pain, Decreased mobility, Increased muscle spasms, Postural dysfunction, Decreased activity tolerance, Decreased endurance, Decreased range of motion, Decreased strength, Hypomobility, Impaired UE functional use, Impaired flexibility, Decreased coordination  Visit Diagnosis: Muscle weakness (generalized)  Chronic right shoulder pain  Abnormal posture     Problem List Patient Active Problem List   Diagnosis Date Noted   Medicare annual wellness visit, initial 07/17/2021   TMJ (dislocation of temporomandibular joint) 07/17/2021   Encounter for screening mammogram  for breast cancer 06/05/2021   Diarrhea 04/11/2020   Epigastric pain 02/19/2020   Dark stools 02/19/2020   PAF (  paroxysmal atrial fibrillation) ([place]) 02/05/2019   Shortness of breath 01/27/2019   Irregular surface of cornea 12/25/2018   HPV (human papilloma virus) infection 12/01/2016   Screening mammogram, encounter for 11/29/2016   Estrogen deficiency 11/29/2016   Encounter for routine gynecological examination 11/29/2016   Grade III hemorrhoids 10/06/2016   Tachycardia 08/14/2016   Hemorrhoids 08/13/2016   Atypical migraine 08/03/2016   CAD (coronary artery disease)    AAA (abdominal aortic aneurysm) (HCC)    Chronic combined systolic (congestive) and diastolic (congestive) heart failure ([place])    Prediabetes 02/11/2016   Generalized anxiety disorder 12/08/2015   Panic attacks 07/28/2015   Sensorineural hearing loss 03/27/2013   Vitamin D deficiency 07/03/2012   H/O aortic dissection 05/12/2012   Routine general medical examination at a health care facility 09/20/2011   S/P AVR (aortic valve replacement) 01/07/2011   CORONARY ARTERY BYPASS GRAFT, HX OF 01/07/2011   Arthritis 12/20/2010   Back pain 12/15/2010   H/O dissecting abdominal aortic aneurysm repair 08/24/2010   IRRITABLE BOWEL SYNDROME 09/09/2009   Obstructive sleep apnea 08/04/2009   External hemorrhoid 06/26/2009   Osteoporosis 01/09/2009   Hyperlipidemia 07/26/2007   Depression with anxiety 07/26/2007   Essential hypertension 07/26/2007   Allergic rhinitis 07/26/2007   Asthma 07/26/2007   GERD 07/26/2007   Osteoarthritis 07/26/2007   Ocular migraine 07/26/2007   Mild intermittent asthma without complication 21/22/4825    Janna Arch, PT, DPT  07/20/2021, 4:45 PM  [place] MAIN [facility] SERVICES [address] [place], [place], [address] Phone: [phone]   Fax:  [phone]  Name: Rachel Torres MRN: [id number] Date of Birth: [birth year]

## 2021-07-21 ENCOUNTER — Encounter: Payer: Self-pay | Admitting: *Deleted

## 2021-07-21 ENCOUNTER — Encounter (INDEPENDENT_AMBULATORY_CARE_PROVIDER_SITE_OTHER): Payer: Self-pay | Admitting: Family Medicine

## 2021-07-21 ENCOUNTER — Ambulatory Visit (INDEPENDENT_AMBULATORY_CARE_PROVIDER_SITE_OTHER): Payer: Medicare HMO | Admitting: Family Medicine

## 2021-07-21 VITALS — BP 114/64 | HR 77 | Temp 97.7°F | Ht 65.0 in | Wt 172.0 lb

## 2021-07-21 DIAGNOSIS — R7303 Prediabetes: Secondary | ICD-10-CM

## 2021-07-21 DIAGNOSIS — E559 Vitamin D deficiency, unspecified: Secondary | ICD-10-CM

## 2021-07-21 DIAGNOSIS — Z6835 Body mass index (BMI) 35.0-35.9, adult: Secondary | ICD-10-CM

## 2021-07-21 DIAGNOSIS — E66812 Obesity, class 2: Secondary | ICD-10-CM

## 2021-07-21 MED ORDER — OZEMPIC (0.25 OR 0.5 MG/DOSE) 2 MG/1.5ML ~~LOC~~ SOPN: 0.5000 mg | PEN_INJECTOR | SUBCUTANEOUS | 0 refills | Status: DC

## 2021-07-22 ENCOUNTER — Other Ambulatory Visit: Payer: Self-pay

## 2021-07-22 ENCOUNTER — Ambulatory Visit: Payer: Medicare HMO

## 2021-07-22 DIAGNOSIS — G8929 Other chronic pain: Secondary | ICD-10-CM

## 2021-07-22 DIAGNOSIS — M542 Cervicalgia: Secondary | ICD-10-CM | POA: Diagnosis not present

## 2021-07-22 DIAGNOSIS — M6281 Muscle weakness (generalized): Secondary | ICD-10-CM

## 2021-07-22 DIAGNOSIS — R293 Abnormal posture: Secondary | ICD-10-CM

## 2021-07-22 DIAGNOSIS — M25511 Pain in right shoulder: Secondary | ICD-10-CM | POA: Diagnosis not present

## 2021-07-22 NOTE — Therapy (Signed)
[place] MAIN [facility] SERVICES [address] [place], [place], [address] Phone: [phone]   Fax:  [phone]  Physical Therapy Treatment/TMJ EVALUATION  Patient Details  Name: Rachel Torres MRN: [id number] Date of Birth: December 28, 1955 Referring Provider (PT): Gregor Hams, MD   Encounter Date: 07/22/2021   PT End of Session - 07/22/21 1546     Visit Number 27    Number of Visits 41    Date for PT Re-Evaluation 07/29/21    Authorization Type Eval performed 7/0/9628 (recert for 02/22/6293); new recert for 7/65/4650    PT Start Time 1430    PT Stop Time 1517    PT Time Calculation (min) 47 min    Activity Tolerance Patient tolerated treatment well;No increased pain    Behavior During Therapy WFL for tasks assessed/performed             Past Medical History:  Diagnosis Date   AAA (abdominal aortic aneurysm) ([place])    a. Chronic w/o evidence of aneurysmal dil on CTA 01/2016.   Alcohol abuse    Allergy    Anemia    Anxiety    CAD (coronary artery disease)    a. 08/2010 s/p CABG x 1 (VG->RCA) @ time of Ao dissection repair; b. 01/2016 Lexiscan MV: mid antsept/apical defect w/ ? peri-infarct ischemia-->likely attenuation-->Med Rx.   Chewing difficulty    Chronic combined systolic (congestive) and diastolic (congestive) heart failure ([place])    a. 2012 EF 30-35%; b. 04/2015 EF 40-45%; c. 01/2016 Echo: Ef 55-65%, nl AoV bioprosthesis, nl RV, nl PASP.   Constipation    Depression    Edema of both lower extremities    Gallbladder sludge    GERD (gastroesophageal reflux disease)    Heart failure (HCC)    Heart valve problem    History of Bicuspid Aortic Valve    a. 08/2010 s/p AVR @ time of Ao dissection repair; b. 01/2016 Echo: Ef 55-65%, nl AoV bioprosthesis, nl RV, nl PASP.   Hx of repair of dissecting thoracic aortic aneurysm, [place] type A    a. 08/2010 s/p repair with AVR and VG->RCA; b. 01/2016 CTA: stable appearance of Asc Thoracic  Aortic graft. Opacification of flase lumen of chronic abd Ao dissection w/ retrograde flow through lumbar arteries. No aneurysmal dil of flase lumen.   Hyperlipidemia    Hypertensive heart disease    IBS (irritable bowel syndrome)    Internal hemorrhoids    Kidney problem    Marfan syndrome    pt denies   Migraine    Obstructive sleep apnea    Osteoarthritis    Osteoporosis    Palpitations    Pneumonia    RA (rheumatoid arthritis) ([place])    a. Followed by Dr. [name]    Past Surgical History:  Procedure Laterality Date   ABDOMINAL AORTIC ANEURYSM REPAIR     CARDIAC CATHETERIZATION  2001   [place]     ESOPHAGOGASTRODUODENOSCOPY (EGD) WITH PROPOFOL N/A 05/08/2020   Procedure: ESOPHAGOGASTRODUODENOSCOPY (EGD) WITH PROPOFOL;  Surgeon: Virgel Manifold, MD;  Location: [facility] ENDOSCOPY;  Service: Endoscopy;  Laterality: N/A;   FRACTURE SURGERY Right    shoulder replacement   HEMORRHOID BANDING     ORIF DISTAL RADIUS FRACTURE Left    UPPER GASTROINTESTINAL ENDOSCOPY      There were no vitals filed for this visit.   Subjective Assessment - 07/22/21 1544  Subjective Patient presents for addition of TMJ to POC.    Pertinent History Pt is a 65 y/o female presenting to therapy due to R shoulder pain. Pt had R shoulder replacement 12-13 years ago.  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 She says shoulder pain started this past January as she was helping lift her husband and his assistive devices. She reports she has since stopped lifting him and his walker as they have caregivers who come to the home for 16 hours/week and can help. Pt is most concerned that her shoulder hurts when she drives and washes her hair. Her shoulder does not hurt constantly, but she states she has pain "the more I use it." She feels a catch and stabbing sensation in R anterior shoulder that can travel into  bicep and that sometimes the pain is a burning sensation. She sometimes has pain in L shoulder.  She rates her worst R shoulder pain as 10/10, best pain is 0/10. Her R shoulder pain is currently 1.5/10. She reports that rest, ice and naproxen help with pain relief. She denies redness/swelling/temp difference and N/T in UEs.  Patient has been seen by other therapists in this clinic previously for cervicalgia and back related injuries/pain. Pt PMH includes COVID19, upper GI stomach issues, aorta type A dissection (2011) with aortic valve replacement, bypass of R coronary artery, HTN, hyperlipidemia, pre-diabetes, AFIB, CHF, depression, anxiety, OA, GERD, atypical migraine, asthma, and allergic rhinitis. Additional diagnosis of TMJ has been added with patient reporting pain has been occuring for ~40 years, worsens with stress and grinding of teeth.    Limitations Lifting;House hold activities;Other (comment)    How long can you sit comfortably? Not affected    How long can you stand comfortably? Not affected    How long can you walk comfortably? Pt reports she has pain only if she is carrying something    Diagnostic tests DG R shoulder 02/09/2021 per chart "Impression: Possible hardware loosening post total shoulder arthroplasty. No  evidence of acute fracture or dislocation"    Patient Stated Goals Be able to drive without pain    Currently in Pain? Yes    Pain Score 2     Pain Location Jaw    Pain Orientation Right;Left    Pain Descriptors / Indicators Aching;Shooting;Guarding    Pain Type Chronic pain    Pain Radiating Towards to neck and to teeth    Pain Onset More than a month ago    Pain Frequency Intermittent    Aggravating Factors  chewing, stress, grinding teeth    Pain Relieving Factors rest    Effect of Pain on Daily Activities limits types of chewing performed              TMJ Assessment  Patient reports TMJ pain has been ongoing for ~40 years, not as bad as it used to be when  she was grinding her teeth in her sleep.    SUBJECTIVE   Onset:  Referring Dx:TMJ MD/Dentist: Last Dental Examination and imaging:will go Friday  Recent dental procedures or orthodontics: No Follow-up appointment with MD: Yes History of prior physical therapy for this issue: No  Pain location:  Pain quality: pain quality: vague Radiating pain: Yes  Numbness/Tingling: No Pain Severity: Present: 2/10, Best: 0/10, Worst:6 /10 Aggravating factors: stress, grinding teeth, chewing Easing factors:relaxing it  Clicking, catching, or crepitus during chewing: Yes 24 hour pain behavior:  History of  grinding teeth: Yes Recent or remote jaw/face/neck trauma, injury, or pain: No Progression (improving, worsening, unchanged):  History of headaches/migraines: Yes Ear symptoms (tinnitus, fullness, pain): Yes Chest pain: No Headaches/migraines: Yes Recent stressful life events: Yes Dominant hand: left    OBJECTIVE  Mental Status Patient is oriented to person, place and time.  Recent memory is intact.  Remote memory is intact.  Attention span and concentration are intact.  Expressive speech is intact.  Patient's fund of knowledge is within normal limits for educational level.  Cranial Nerves Visual acuity and visual fields are intact  Extraocular muscles are intact  Facial sensation is intact bilaterally  Facial strength is intact bilaterally  Hearing is normal as tested by gross conversation but patient reports decreased R side hearing.  Palate elevates midline, normal phonation  Shoulder shrug strength is intact  Tongue protrudes midline     MUSCULOSKELETAL: Tremor: None Bulk: Normal Tone: Normal Facial Symmetry: Face appears grossly symmetrical  Posture No gross deficits in standing or sitting posture  Cervical Screen Centralization: No centralization or peripheralization of symptoms with repeated cervical protraction and retraction.  AROM: Full and painless AROM in  all directions with overpressure (flexion, extension, rotation, and lateral flexion) Isometrics: Full and painless in all directions Passive Accessory Intervertebral Motion (PAIVM), central and unilateral (bilaterally): Normal mobility without reproduction of symptoms Spurlings A (ipsilateral lateral flexion/axial compression): R: Not done L: Not done: not done due to known history  Spurlings B (ipsilateral lateral flexion/contralateral rotation/axial compression): R: Not done L: Not done; history of AAA Cervical Distraction Test: Positive  Cervical Fexion-Rotation Test: Not done  Hoffman Sign (cervical cord compression): R: Not done L: Not done: HISTORY OF AAA  Cervical AROM R/L 60 Cervical Flexion 60 Cervical Extension 35/30 Cervical Lateral Flexion 50/60 Cervical Rotation  Dermatome/Myotome Screen N=normal  Ab=abnormal Level Dermatome R L Myotome R L Reflex R L  C2 Posterior Scalp N N Cervical Flexion/Extension C1-2 N N Jaw CN V    C3 Anterior Neck N N Cervical Sidebend C2-3 N N Biceps C5-6    C4 Top of Shoulder N N Shoulder Shrug C4 N N Brachiorad. C5-6    C5 Lateral Upper Arm N N Shoulder ABD C4-5 N N Triceps C7    C6 Lateral Arm/ Thumb N N Arm Flex/ Wrist Ext C5-6 N N     C7 Middle Finger N N Arm Ext//Wrist Flex C6-7 N N     C8 4th & 5th Finger N N Flex/ Ext Carpi Ulnaris C8 N N     T1 Medial Arm N N Interossei T1 N N      Sensation Grossly intact to light touch bilateral face and UE as determined by testing branches of trigeminal nerve and dermatomes C2-T2  Palpation Graded on 0-4 scale (0 = no pain, 1 = pain, 2 = pain with wincing/grimacing/flinching, 3 = pain with withdrawal, 4 = unwilling to allow palpation) Location LEFT  RIGHT           Temporomandibular Joint (posterior, superior, anterior) 2 2  Temporalis (anterior, middle, posterior fibers) 2 2  Temporalis Tendon Insertion 3 3  Masseter (Zygoma, Body, Lateral surface of angle of mandible) 1 2  Medial ptyergoid 2 2   Frontal Sinus 0 0  Maxillary Sinus 0 0  SCM 1 1  Upper Trapezius 1 1  Subocciptials 1 1    Mandibular AROM Resting Dental Alignment/Occlusion (Overbite, underbite, overjet, crossbite):  Mandible Protrusion (3-60m):   Audible joint sounds (crepitus  or clicking with stethoscope with opening, lateral deviation, and bite): Positive Reciprocal Click (palpation, click with both opening/closing): Positive Deviation of Mouth During Opening: Positive Deflection of Mouth at End Range: Positive  Opens to left  Strength R/L Functional Mandible Depression (Jaw Opening): functional but painful Functional Mandible Protrusion: functional but painful Functional Mandible Elevation (Jaw Closing) functional but painful Functional/Functional Mandible Lateral Deviation; functional but painful   Passive Accessory Joint Motion (PAIVM) Inferior (Caudal Glide):hypomobile painful  Anterior Glide: WNL Medial Glide: Painful hypomobile Lateral Glide:Painful hypomobile  Medial and Lateral Extra-oral Glide: painful hypomobile  Pt denies reproduction of TMJ pain with CPA C2-T7 and UPA bilaterally C1-T7.  Special Tests  Manual Joint Compression: Positive Manual Joint Distraction: Positive      ASSESSMENT Clinical Impression: Pt is a pleasant 65 year-old female referred for TMJ pain to add to current POC for shoulder. PT examination reveals deficits in joint mobility as well as pain with multiple muscle knots noted throughout region. She has a left directional opening with audible clicking/popping with/without stethoscope. Cervical ROM is limited however she does have excessive hypermobility of rest of body. Mobilizations of L jaw is more painful and restricted than right side with patient reporting relief with prolonged mobilizations. NDI score of 22%.  Pain limits eating types of foods as well as results in migraines. Pt presents with deficits in strength, mobility, range of motion, and pain. Pt will  benefit from skilled PT services to address deficits and return to pain-free function at home and work in addition to current POC for shoulder.       Trigger Point Dry Needling (TDN), unbilled Education performed with patient regarding potential benefit of TDN. Reviewed precautions and risks with patient. Reviewed special precautions/risks over lung fields which include pneumothorax. Reviewed signs and symptoms of pneumothorax and advised pt to go to ER immediately if these symptoms develop advise them of dry needling treatment. Extensive time spent with pt to ensure full understanding of TDN risks. Pt provided verbal consent to treatment. TDN performed to  with 0.20 x .30 single needle placements with local twitch response (LTR). Pistoning technique utilized. Improved pain-free motion following intervention. Musculature addressed is as followed: masseter bilateral, pterygoid bilateral, temporal region bilateral, R upper trap. X 6 minutes                                PT Education - 07/22/21 1546     Education Details TMJ evaluation, TDN    Person(s) Educated Patient    Methods Explanation    Comprehension Verbalized understanding              PT Short Term Goals - 06/25/21 1404       PT SHORT TERM GOAL #1   Title Patient will be independent in home exercise program to improve strength/mobility for better functional independence with ADLs.    Baseline 02/24/2021 Pt issued HEP; 5/19: HEP to be advanced, pt has been unable to perform HEP consistently d/t spouse surgery and son's wedding.; 7/7: pt reports she has not been performing HEP "as often as I'm supposed to."    Time 6    Period Weeks    Status On-going    Target Date 08/06/21               PT Long Term Goals - 07/22/21 1551       PT LONG TERM GOAL #1   Title Patient  will report a worst pain of 3/10  in last 7 days in her R shoulder to improve tolerance with ADLs and reduced symptoms with  activities.    Baseline 02/24/2021: Pt reports worst pain as 10/10.; 5/18: 4/10 is worst pain; 7/7: pt reports worst pain 5/10    Time 12    Period Weeks    Status Partially Met    Target Date 07/29/21      PT LONG TERM GOAL #2   Title Patient will decrease Quick DASH scores by > 8 points demonstrating reduced self-reported upper extremity disability.    Baseline 02/24/2021: QuickDash Disability/symptom score 40, Work score 37.5; 5/18: Disability/symtpom 38.6, Work 43.75; 7/7: disability/sx score 50%, work 62.5%    Time 12    Period Weeks    Status On-going    Target Date 07/29/21      PT LONG TERM GOAL #3   Title Patient will increase FOTO score to equal to or greater than 60 to demonstrate statistically significant improvement in mobility and quality of life.    Baseline 02/24/2021: 55; 5/18: 60; 7/7: 56    Time 12    Period Weeks    Status Achieved      PT LONG TERM GOAL #4   Title Patient will increase BUE MMT scores by at least 1/2 point as to improve functional strength and endurance for overhead activities and ADLs such as driving and washing her hair.    Baseline 02/24/2021: L/R shoulder flexion 4/5, 3+/5; shoulder abduction 4/5 B; shoulder extension 4+5 B; shoulder IR/ER both grossly 4/5 B; elbow flexion/extension 5/5 for both B; wrist flexion/extension 5/5 for both B; 5/18: L shoulder is grossly 4+/5.  R shoulder flexion 3+/5; R shoulder abduction 4-/5; R shoulder IR/ER both 4-/5 (B); R elbow flexion 4/5, elbow ext. 5/5 B, shoulder ext. 5/5 B; 7/7:  L shoulder grossly 4+/5, R shoulder 4/5 except shoulder flexion 4-/5, R shoulder IR/ER pain limited to 3/5    Time 12    Period Weeks    Status On-going    Target Date 07/29/21      PT LONG TERM GOAL #5   Title Pt will demonstrate decrease in NDI by at least 19% in order to demonstrate clinically significant reduction in disability related to neck/TMJ injury/pain    Baseline 8/3: 22%    Time 12    Period Weeks    Status New    Target  Date 10/14/21      Additional Long Term Goals   Additional Long Term Goals Yes      PT LONG TERM GOAL #6   Title Patient will report decreased worst VAS of jaw to <3/10 for decreased pain with eating and improved quality of life    Baseline 8/3: 6/10    Time 12    Period Weeks    Status New    Target Date 10/14/21      PT LONG TERM GOAL #7   Title Patient will report little to no clicking/catching with chewing for improved quality of life and ADL performance    Baseline 8/3: audible clicking/catching    Time 12    Period Weeks    Status New    Target Date 10/14/21                   Plan - 07/22/21 1550     Clinical Impression Statement Pt is a pleasant 65 year-old female referred for TMJ pain to add to current  POC for shoulder. PT examination reveals deficits in joint mobility as well as pain with multiple muscle knots noted throughout region. She has a left directional opening with audible clicking/popping with/without stethoscope. Cervical ROM is limited however she does have excessive hypermobility of rest of body. Mobilizations of L jaw is more painful and restricted than right side with patient reporting relief with prolonged mobilizations. NDI score of 22%.  Pain limits eating types of foods as well as results in migraines. Pt presents with deficits in strength, mobility, range of motion, and pain. Pt will benefit from skilled PT services to address deficits and return to pain-free function at home and work in addition to current POC for shoulder.    Personal Factors and Comorbidities Comorbidity 1;Comorbidity 2;Comorbidity 3+;Past/Current Experience;Social Background;Time since onset of injury/illness/exacerbation;Transportation;Fitness;Other    Comorbidities Pertinent: recent hx of COVID19, HTN, hyperlipidemia, pre-diabetes, AFIB, CHF, depression, anxiety, OA, atypical migraine, asthma.    Examination-Activity Limitations Bathing;Bed Mobility;Hygiene/Grooming;Lift;Caring  for Others;Reach Overhead;Carry;Dressing;Sleep;Other;Self Feeding    Examination-Participation Restrictions Laundry;Cleaning;Driving;Community Activity;Other;Yard Work;Shop;Meal Prep;Volunteer    Stability/Clinical Decision Making Stable/Uncomplicated    Clinical Decision Making Low    Rehab Potential Good    PT Frequency 2x / week    PT Duration 12 weeks    PT Treatment/Interventions ADLs/Self Care Home Management;Biofeedback;Cryotherapy;Electrical Stimulation;Moist Heat;Traction;Ultrasound;DME Instruction;Functional mobility training;Therapeutic activities;Therapeutic exercise;Neuromuscular re-education;Patient/family education;Orthotic Fit/Training;Manual techniques;Scar mobilization;Passive range of motion;Dry needling;Taping;Energy conservation;Joint Manipulations;Spinal Manipulations;Gait training;Visual/perceptual remediation/compensation    PT Next Visit Plan TMJ HEP    PT Home Exercise Plan Issued HEP with handout: RTB rows, RTB shoulder ER pull-aparts, and upper trap stretch; shoulder perturbation exercises, ROM for shoulder IR within pain-free range, mobilizations/distraction to R shoulder, continue training correct lifting mechanics in order to reduce strain on RUE, lifting mechanics    Consulted and Agree with Plan of Care Patient             Patient will benefit from skilled therapeutic intervention in order to improve the following deficits and impairments:  Increased fascial restricitons, Improper body mechanics, Pain, Decreased mobility, Increased muscle spasms, Postural dysfunction, Decreased activity tolerance, Decreased endurance, Decreased range of motion, Decreased strength, Hypomobility, Impaired UE functional use, Impaired flexibility, Decreased coordination, Impaired tone  Visit Diagnosis: Cervicalgia  Muscle weakness (generalized)  Chronic right shoulder pain  Abnormal posture     Problem List Patient Active Problem List   Diagnosis Date Noted   Medicare  annual wellness visit, initial 07/17/2021   TMJ (dislocation of temporomandibular joint) 07/17/2021   Encounter for screening mammogram for breast cancer 06/05/2021   Diarrhea 04/11/2020   Epigastric pain 02/19/2020   Dark stools 02/19/2020   PAF (paroxysmal atrial fibrillation) ([place]) 02/05/2019   Shortness of breath 01/27/2019   Irregular surface of cornea 12/25/2018   HPV (human papilloma virus) infection 12/01/2016   Screening mammogram, encounter for 11/29/2016   Estrogen deficiency 11/29/2016   Encounter for routine gynecological examination 11/29/2016   Grade III hemorrhoids 10/06/2016   Tachycardia 08/14/2016   Hemorrhoids 08/13/2016   Atypical migraine 08/03/2016   CAD (coronary artery disease)    AAA (abdominal aortic aneurysm) ([facility])    Chronic combined systolic (congestive) and diastolic (congestive) heart failure ([place])    Prediabetes 02/11/2016   Generalized anxiety disorder 12/08/2015   Panic attacks 07/28/2015   Sensorineural hearing loss 03/27/2013   Vitamin D deficiency 07/03/2012   H/O aortic dissection 05/12/2012   Routine general medical examination at a health care facility 09/20/2011   S/P AVR (aortic valve replacement) 01/07/2011  CORONARY ARTERY BYPASS GRAFT, HX OF 01/07/2011   Arthritis 12/20/2010   Back pain 12/15/2010   H/O dissecting abdominal aortic aneurysm repair 08/24/2010   IRRITABLE BOWEL SYNDROME 09/09/2009   Obstructive sleep apnea 08/04/2009   External hemorrhoid 06/26/2009   Osteoporosis 01/09/2009   Hyperlipidemia 07/26/2007   Depression with anxiety 07/26/2007   Essential hypertension 07/26/2007   Allergic rhinitis 07/26/2007   Asthma 07/26/2007   GERD 07/26/2007   Osteoarthritis 07/26/2007   Ocular migraine 07/26/2007   Mild intermittent asthma without complication 87/27/6184    Janna Arch, PT, DPT  07/22/2021, 4:18 PM  [place] MAIN [facility] SERVICES [address] [place], [place],  [address] Phone: [phone]   Fax:  [phone]  Name: Rachel Torres MRN: [id number] Date of Birth: 11-13-1956

## 2021-07-23 ENCOUNTER — Ambulatory Visit (INDEPENDENT_AMBULATORY_CARE_PROVIDER_SITE_OTHER): Payer: Medicare HMO | Admitting: Psychologist

## 2021-07-23 DIAGNOSIS — F411 Generalized anxiety disorder: Secondary | ICD-10-CM

## 2021-07-23 NOTE — Progress Notes (Signed)
Chief Complaint:   OBESITY Rachel Torres is here to discuss her progress with her obesity treatment plan along with follow-up of her obesity related diagnoses. [name] is on following a lower carbohydrate, vegetable and lean protein rich diet plan and states she is following her eating plan approximately 90-95% of the time. [name] states she is doing physical therapy for 45 minutes 2 times per week.  Today's visit was #: 7 Starting weight: 213 lbs Starting date: 05/05/2020 Today's weight: 172 lbs Today's date: 07/21/2021 Total lbs lost to date: 41 Total lbs lost since last in-office visit: 2  Interim History: [name] continues to do well with weight loss. She has decreased simple carbohydrates and she is working on meal planning, but she finds this difficult as her husband is trying not to lose weight.   Subjective:   1. Pre-diabetes Rachel Torres is stable on Ozempic, and she needs a refill today.  2. Vitamin D deficiency Rachel Torres is on Vit D 5,000 IU daily, and her level is at goal.  Assessment/Plan:   1. Pre-diabetes [name] will continue to work on weight loss, exercise, and decreasing simple carbohydrates to help decrease the risk of diabetes. We will refill Ozempic for 1 month.  - Semaglutide,0.25 or 0.'5MG'$ /DOS, (OZEMPIC, 0.25 OR 0.5 MG/DOSE,) 2 MG/1.5ML SOPN; Inject 0.5 mg into the skin once a week.  Dispense: 1.5 mL; Refill: 0  2. Vitamin D deficiency Low Vitamin D level contributes to fatigue and are associated with obesity, breast, and colon cancer. Rachel Torres agreed to decrease OTC Vitamin D 2,000 IU daily and will follow-up for routine testing of Vitamin D, at least 2-3 times per year to avoid over-replacement.  3. Obesity with current BMI 28.7 Rachel Torres is currently in the action stage of change. As such, her goal is to continue with weight loss efforts. She has agreed to following a lower carbohydrate, vegetable and lean protein rich diet plan.   Exercise goals: As  is.  Behavioral modification strategies: increasing lean protein intake and dealing with family or coworker sabotage.  Rachel Torres has agreed to follow-up with our clinic in 4 weeks. She was informed of the importance of frequent follow-up visits to maximize her success with intensive lifestyle modifications for her multiple health conditions.   Objective:   Blood pressure 114/64, pulse 77, temperature 97.7 F (36.5 C), height '5\' 5"'$  (1.651 m), weight 172 lb (78 kg), SpO2 96 %. Body mass index is 28.62 kg/m.  General: Cooperative, alert, well developed, in no acute distress. HEENT: Conjunctivae and lids unremarkable. Cardiovascular: Regular rhythm.  Lungs: Normal work of breathing. Neurologic: No focal deficits.   Lab Results  Component Value Date   CREATININE 0.89 07/10/2021   BUN 14 07/10/2021   NA 135 07/10/2021   K 4.8 07/10/2021   CL 99 07/10/2021   CO2 30 07/10/2021   Lab Results  Component Value Date   ALT 15 07/10/2021   AST 16 07/10/2021   ALKPHOS 62 07/10/2021   BILITOT 0.6 07/10/2021   Lab Results  Component Value Date   HGBA1C 5.6 07/10/2021   HGBA1C 5.5 02/26/2021   HGBA1C 5.7 (H) 10/29/2020   HGBA1C 5.6 07/23/2020   HGBA1C 5.9 (H) 05/05/2020   Lab Results  Component Value Date   INSULIN 17.3 02/26/2021   INSULIN 13.9 10/29/2020   INSULIN 22.5 07/23/2020   INSULIN 25.2 (H) 05/05/2020   Lab Results  Component Value Date   TSH 1.54 07/10/2021   Lab Results  Component Value Date  CHOL 166 07/10/2021   HDL 55.20 07/10/2021   LDLCALC 89 07/10/2021   LDLDIRECT 189.4 10/13/2011   TRIG 105.0 07/10/2021   CHOLHDL 3 07/10/2021   Lab Results  Component Value Date   VD25OH 74.87 07/10/2021   VD25OH 62.2 02/26/2021   VD25OH 59.5 10/29/2020   Lab Results  Component Value Date   WBC 5.6 07/10/2021   HGB 13.9 07/10/2021   HCT 41.3 07/10/2021   MCV 96.2 07/10/2021   PLT 274.0 07/10/2021   Lab Results  Component Value Date   FERRITIN 105.0  12/11/2010    Obesity Behavioral Intervention:   Approximately 15 minutes were spent on the discussion below.  ASK: We discussed the diagnosis of obesity with Rachel Torres today and Rachel Torres agreed to give Rachel Torres permission to discuss obesity behavioral modification therapy today.  ASSESS: [name] has the diagnosis of obesity and her BMI today is 28.62. Rachel Torres is in the action stage of change.   ADVISE: [name] was educated on the multiple health risks of obesity as well as the benefit of weight loss to improve her health. She was advised of the need for long term treatment and the importance of lifestyle modifications to improve her current health and to decrease her risk of future health problems.  AGREE: Multiple dietary modification options and treatment options were discussed and Rachel Torres agreed to follow the recommendations documented in the above note.  ARRANGE: Rachel Torres was educated on the importance of frequent visits to treat obesity as outlined per CMS and USPSTF guidelines and agreed to schedule her next follow up appointment today.  Attestation Statements:   Reviewed by clinician on day of visit: allergies, medications, problem list, medical history, surgical history, family history, social history, and previous encounter notes.   I, Trixie Dredge, am acting as transcriptionist for Rachel Nip, MD.  I have reviewed the above documentation for accuracy and completeness, and I agree with the above. -  Rachel Nip, MD

## 2021-07-27 ENCOUNTER — Telehealth: Payer: Self-pay

## 2021-07-27 ENCOUNTER — Other Ambulatory Visit: Payer: Self-pay

## 2021-07-27 ENCOUNTER — Ambulatory Visit: Payer: Medicare HMO

## 2021-07-27 DIAGNOSIS — M25511 Pain in right shoulder: Secondary | ICD-10-CM | POA: Diagnosis not present

## 2021-07-27 DIAGNOSIS — M542 Cervicalgia: Secondary | ICD-10-CM

## 2021-07-27 DIAGNOSIS — R293 Abnormal posture: Secondary | ICD-10-CM | POA: Diagnosis not present

## 2021-07-27 DIAGNOSIS — M6281 Muscle weakness (generalized): Secondary | ICD-10-CM

## 2021-07-27 DIAGNOSIS — G8929 Other chronic pain: Secondary | ICD-10-CM

## 2021-07-27 NOTE — Chronic Care Management (AMB) (Addendum)
Chronic Care Management Pharmacy Assistant   Name: Rachel Torres  MRN: [id number] DOB: 1956/07/30  Reason for Encounter: Cholesterol and HTN Review   Recent office visits:  None since last CCM contact  Recent consult visits:  06/23/21- [facility] weight management-no medication changes  Hospital visits:  None in previous 6 months  Medications: Outpatient Encounter Medications as of 07/27/2021  Medication Sig   acetaminophen (TYLENOL) 500 MG tablet Take 500 mg by mouth every 6 (six) hours as needed.   albuterol (VENTOLIN HFA) 108 (90 Base) MCG/ACT inhaler Inhale 1-2 puffs into the lungs every 6 (six) hours as needed for wheezing or shortness of breath.   ALPRAZolam (XANAX) 0.5 MG tablet Take 1 tablet (0.5 mg total) by mouth daily as needed for anxiety.   aspirin EC 81 MG tablet Take 81 mg by mouth at bedtime.   atorvastatin (LIPITOR) 20 MG tablet TAKE 1 TABLET EVERY DAY   carvedilol (COREG) 6.25 MG tablet TAKE 1 TABLET TWICE DAILY WITH A MEAL   Cholecalciferol (VITAMIN D3) 50 MCG (2000 UT) CAPS Take 2,000 capsules by mouth.   cyclobenzaprine (FLEXERIL) 10 MG tablet Take 0.5-1 tablets (5-10 mg total) by mouth 3 (three) times daily as needed. For headache   furosemide (LASIX) 20 MG tablet TAKE 1 TABLET DAILY. MAY TAKE ADDITIONAL TAB IN AFTERNOON AS NEEDED FOR SWELLING, SHORTNESS OF BREATH, OR WEIGHT GAIN.   Insulin Pen Needle (BD PEN NEEDLE NANO U/F) 32G X 4 MM MISC Use Nano needle with Ozempic   Krill Oil (OMEGA-3) 500 MG CAPS Take 1 capsule by mouth daily.   lisinopril (ZESTRIL) 5 MG tablet TAKE 1/2 TABLET EVERY DAY   magnesium gluconate (MAGONATE) 500 MG tablet Take 500 mg by mouth at bedtime.   Melatonin 5 MG SUBL Place under the tongue.   Misc Natural Products (TART CHERRY ADVANCED) CAPS Take 1 capsule by mouth daily.   naproxen sodium (ANAPROX) 220 MG tablet Take 440 mg by mouth 2 (two) times daily as needed (for headaches/pain).   nystatin (MYCOSTATIN/NYSTOP) powder Apply 1  application topically 2 (two) times daily. Apply to affected area twice a day for 14 days   omeprazole (PRILOSEC) 40 MG capsule Take 1 capsule (40 mg total) by mouth daily.   polyethylene glycol powder (GLYCOLAX/MIRALAX) 17 GM/SCOOP powder Take 17 g by mouth as needed.   potassium chloride (KLOR-CON) 10 MEQ tablet TAKE 1 TABLET TWICE A DAY AS NEEDED. TAKE ADDITIONAL TABLET WHEN INCREASING FUROSEMIDE   promethazine (PHENERGAN) 25 MG tablet Take 0.5 tablets (12.5 mg total) by mouth every 8 (eight) hours as needed for nausea.   Semaglutide,0.25 or 0.'5MG'$ /DOS, (OZEMPIC, 0.25 OR 0.5 MG/DOSE,) 2 MG/1.5ML SOPN Inject 0.5 mg into the skin once a week.   Simethicone (MYLANTA GAS PO) Take by mouth 3 times/day as needed-between meals & bedtime.   venlafaxine XR (EFFEXOR-XR) 150 MG 24 hr capsule TAKE 1 CAPSULE EVERY DAY WITH BREAKFAST   No facility-administered encounter medications on file as of 07/27/2021.    Recent Office Vitals: BP Readings from Last 3 Encounters:  07/21/21 114/64  07/17/21 112/78  06/23/21 109/68   Pulse Readings from Last 3 Encounters:  07/21/21 77  07/17/21 84  06/23/21 82    Wt Readings from Last 3 Encounters:  07/21/21 172 lb (78 kg)  07/17/21 176 lb (79.8 kg)  06/23/21 174 lb (78.9 kg)     Kidney Function Lab Results  Component Value Date/Time   CREATININE 0.89 07/10/2021 08:10 AM  CREATININE 0.91 02/26/2021 01:55 PM   CREATININE 0.94 06/20/2013 08:15 AM   CREATININE 0.96 05/12/2012 12:12 PM   CREATININE 0.88 04/09/2011 09:00 AM   GFR 68.85 07/10/2021 08:10 AM   GFRNONAA 68 07/23/2020 03:33 PM   GFRNONAA >60 06/20/2013 08:15 AM   GFRAA 78 07/23/2020 03:33 PM   GFRAA >60 06/20/2013 08:15 AM    BMP Latest Ref Rng & Units 07/10/2021 02/26/2021 10/21/2020  Glucose 70 - 99 mg/dL 95 97 98  BUN 6 - 23 mg/dL '14 16 13  '$ Creatinine 0.40 - 1.20 mg/dL 0.89 0.91 0.82  BUN/Creat Ratio 12 - 28 - 18 -  Sodium 135 - 145 mEq/L 135 137 136  Potassium 3.5 - 5.1 mEq/L 4.8 4.7  4.3  Chloride 96 - 112 mEq/L 99 100 100  CO2 19 - 32 mEq/L '30 21 29  '$ Calcium 8.4 - 10.5 mg/dL 9.5 9.5 9.2   Current antihypertensive regimen:  Carvedilol 6.25 mg - 1 tablet twice daily with meal Lisinopril 5 mg - 1/2 tablet daily  Adherence Review: Is the patient currently on ACE/ARB medication? Yes Does the patient have >5 day gap between last estimated fill dates? No  Star Rating Drugs:  Medication:  Last Fill: Day Supply Ozempic  Lisinopril '5mg'$   06/03/21 90 Atorvastatin '20mg'$  06/03/21 90  Care Gaps: None Last annual wellness visit: 07/17/21 - Dr.Tower PCP   Lipid Panel    Component Value Date/Time   CHOL 166 07/10/2021 0810   CHOL 219 (H) 02/26/2021 1355   TRIG 105.0 07/10/2021 0810   TRIG 110 12/29/2006 1251   HDL 55.20 07/10/2021 0810   HDL 64 02/26/2021 1355   LDLCALC 89 07/10/2021 0810   LDLCALC 141 (H) 02/26/2021 1355   LDLDIRECT 189.4 10/13/2011 1054    10-year ASCVD risk score: The 10-year ASCVD risk score Mikey Bussing DC Brooke Bonito., et al., 2013) is: 8.1%   Values used to calculate the score:     Age: 21 years     Sex: Female     Is Non-Hispanic African American: No     Diabetic: Yes     Tobacco smoker: No     Systolic Blood Pressure: 99991111 mmHg     Is BP treated: Yes     HDL Cholesterol: 55.2 mg/dL     Total Cholesterol: 166 mg/dL  Current antihyperlipidemic regimen:  Krill Oil (OMEGA-3) 500 MG CAPS - 1 capsule daily Atorvastatin (LIPITOR) 20 MG tablet - 1 tablet daily   Previous antihyperlipidemic medications tried: none  ASCVD risk enhancing conditions: HTN, Pre-DM   Attempted contact with Rachel Torres 3 times on 07/27/21, 07/31/21, 08/06/21. Unsuccessful outreach. Will attempt contact next month.   Debbora Dus, CPP notified  Avel Sensor, [place] Assistant [phone]  I have reviewed the care management and care coordination activities outlined in this encounter and I am certifying that I agree with the content of this note. No further  action required.  Debbora Dus, PharmD Clinical Pharmacist [facility] [phone]

## 2021-07-27 NOTE — Therapy (Signed)
[place] MAIN [facility] SERVICES [address] [place], [place], [address] Phone: [phone]   Fax:  [phone]  Physical Therapy Treatment  Patient Details  Name: Rachel Torres MRN: [id number] Date of Birth: 1956-03-12 Referring Provider (PT): Gregor Hams, MD   Encounter Date: 07/27/2021   PT End of Session - 07/27/21 1707     Visit Number 28    Number of Visits 41    Date for PT Re-Evaluation 07/29/21    Authorization Type Eval performed 0/0/7622 (recert for 05/22/3353); new recert for 5/62/5638    PT Start Time 1430    PT Stop Time 1515    PT Time Calculation (min) 45 min    Activity Tolerance Patient tolerated treatment well;No increased pain    Behavior During Therapy WFL for tasks assessed/performed             Past Medical History:  Diagnosis Date   AAA (abdominal aortic aneurysm) ([place])    a. Chronic w/o evidence of aneurysmal dil on CTA 01/2016.   Alcohol abuse    Allergy    Anemia    Anxiety    CAD (coronary artery disease)    a. 08/2010 s/p CABG x 1 (VG->RCA) @ time of Ao dissection repair; b. 01/2016 Lexiscan MV: mid antsept/apical defect w/ ? peri-infarct ischemia-->likely attenuation-->Med Rx.   Chewing difficulty    Chronic combined systolic (congestive) and diastolic (congestive) heart failure ([place])    a. 2012 EF 30-35%; b. 04/2015 EF 40-45%; c. 01/2016 Echo: Ef 55-65%, nl AoV bioprosthesis, nl RV, nl PASP.   Constipation    Depression    Edema of both lower extremities    Gallbladder sludge    GERD (gastroesophageal reflux disease)    Heart failure (HCC)    Heart valve problem    History of Bicuspid Aortic Valve    a. 08/2010 s/p AVR @ time of Ao dissection repair; b. 01/2016 Echo: Ef 55-65%, nl AoV bioprosthesis, nl RV, nl PASP.   Hx of repair of dissecting thoracic aortic aneurysm, [place] type A    a. 08/2010 s/p repair with AVR and VG->RCA; b. 01/2016 CTA: stable appearance of Asc Thoracic Aortic graft.  Opacification of flase lumen of chronic abd Ao dissection w/ retrograde flow through lumbar arteries. No aneurysmal dil of flase lumen.   Hyperlipidemia    Hypertensive heart disease    IBS (irritable bowel syndrome)    Internal hemorrhoids    Kidney problem    Marfan syndrome    pt denies   Migraine    Obstructive sleep apnea    Osteoarthritis    Osteoporosis    Palpitations    Pneumonia    RA (rheumatoid arthritis) ([place])    a. Followed by Dr. [name]    Past Surgical History:  Procedure Laterality Date   ABDOMINAL AORTIC ANEURYSM REPAIR     CARDIAC CATHETERIZATION  2001   [place]     ESOPHAGOGASTRODUODENOSCOPY (EGD) WITH PROPOFOL N/A 05/08/2020   Procedure: ESOPHAGOGASTRODUODENOSCOPY (EGD) WITH PROPOFOL;  Surgeon: Virgel Manifold, MD;  Location: [facility] ENDOSCOPY;  Service: Endoscopy;  Laterality: N/A;   FRACTURE SURGERY Right    shoulder replacement   HEMORRHOID BANDING     ORIF DISTAL RADIUS FRACTURE Left    UPPER GASTROINTESTINAL ENDOSCOPY      There were no vitals filed for this visit.   Subjective Assessment - 07/27/21 1702  Subjective Patient presents for first TMJ/shoulder session. Reports she has been moving some light furniture due to a small remodel in her home.    Pertinent History Pt is a 65 y/o female presenting to therapy due to R shoulder pain. Pt had R shoulder replacement 12-13 years ago.  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 She says shoulder pain started this past January as she was helping lift her husband and his assistive devices. She reports she has since stopped lifting him and his walker as they have caregivers who come to the home for 16 hours/week and can help. Pt is most concerned that her shoulder hurts when she drives and washes her hair. Her shoulder does not hurt constantly, but she states she has pain "the more I use it." She feels a  catch and stabbing sensation in R anterior shoulder that can travel into bicep and that sometimes the pain is a burning sensation. She sometimes has pain in L shoulder.  She rates her worst R shoulder pain as 10/10, best pain is 0/10. Her R shoulder pain is currently 1.5/10. She reports that rest, ice and naproxen help with pain relief. She denies redness/swelling/temp difference and N/T in UEs.  Patient has been seen by other therapists in this clinic previously for cervicalgia and back related injuries/pain. Pt PMH includes COVID19, upper GI stomach issues, aorta type A dissection (2011) with aortic valve replacement, bypass of R coronary artery, HTN, hyperlipidemia, pre-diabetes, AFIB, CHF, depression, anxiety, OA, GERD, atypical migraine, asthma, and allergic rhinitis. Additional diagnosis of TMJ has been added with patient reporting pain has been occuring for ~40 years, worsens with stress and grinding of teeth.    Limitations Lifting;House hold activities;Other (comment)    How long can you sit comfortably? Not affected    How long can you stand comfortably? Not affected    How long can you walk comfortably? Pt reports she has pain only if she is carrying something    Diagnostic tests DG R shoulder 02/09/2021 per chart "Impression: Possible hardware loosening post total shoulder arthroplasty. No  evidence of acute fracture or dislocation"    Patient Stated Goals Be able to drive without pain    Currently in Pain? Yes    Pain Score 2     Pain Location Jaw    Pain Orientation Right;Left    Pain Descriptors / Indicators Aching    Pain Type Chronic pain    Pain Onset More than a month ago    Pain Frequency Intermittent              Perform HEP and demonstrate understanding for TMJ:   Access Code: [email or web address] URL: [email or web address] Date: 07/23/2021 Prepared by: Janna Arch  Exercises  Jaw Abduction - 1 x daily - 7 x weekly - 2 sets - 10 reps - 5 hold Seated Jaw  Distraction - 1 x daily - 7 x weekly - 2 sets - 10 reps - 5 hold Hooklying Jaw Protrusion - 1 x daily - 7 x weekly - 2 sets - 10 reps - 5 hold   Trigger Point Dry Needling (TDN), unbilled Education performed with patient regarding potential benefit of TDN. Reviewed precautions and risks with patient. Reviewed special precautions/risks over lung fields which include pneumothorax. Reviewed signs and symptoms of pneumothorax and advised pt to go to ER immediately if these symptoms develop advise them of dry needling treatment. Extensive time spent with pt to  ensure full understanding of TDN risks. Pt provided verbal consent to treatment. TDN performed to  with 0.20 x .30 single needle placements with local twitch response (LTR). Pistoning technique utilized. Improved pain-free motion following intervention. Musculature addressed is as followed: masseter bilateral, pterygoid bilateral, temporal region bilateral, R upper trap. X 14 minutes   Supine:  Suboccipital release 3x 30 second holds Gentle PA mobilizations cervical spine grade II with upward/downward rotational accessory motion x 6 minutes  Pt educated throughout session about proper posture and technique with exercises. Improved exercise technique, movement at target joints, use of target muscles after min to mod verbal, visual, tactile cues  Patient introduced and performed TMJ HEP demonstrating understanding, Cueing for arc of movement required due to patient preference for excessive amplitude rather than slow small movements. Patient is highly motivated thoughout session and tolerated therex and manual well. Her TDN was performed with patient reporting multiple tirgger point relief sites. Pt will benefit from skilled PT services to address deficits and return to pain-free function at home and with caregiving.                      PT Education - 07/27/21 1706     Education Details TDN, body mechanics, HEP    Person(s)  Educated Patient    Methods Explanation;Demonstration;Tactile cues;Verbal cues    Comprehension Verbalized understanding;Returned demonstration;Verbal cues required;Tactile cues required              PT Short Term Goals - 06/25/21 1404       PT SHORT TERM GOAL #1   Title Patient will be independent in home exercise program to improve strength/mobility for better functional independence with ADLs.    Baseline 02/24/2021 Pt issued HEP; 5/19: HEP to be advanced, pt has been unable to perform HEP consistently d/t spouse surgery and son's wedding.; 7/7: pt reports she has not been performing HEP "as often as I'm supposed to."    Time 6    Period Weeks    Status On-going    Target Date 08/06/21               PT Long Term Goals - 07/22/21 1551       PT LONG TERM GOAL #1   Title Patient will report a worst pain of 3/10  in last 7 days in her R shoulder to improve tolerance with ADLs and reduced symptoms with activities.    Baseline 02/24/2021: Pt reports worst pain as 10/10.; 5/18: 4/10 is worst pain; 7/7: pt reports worst pain 5/10    Time 12    Period Weeks    Status Partially Met    Target Date 07/29/21      PT LONG TERM GOAL #2   Title Patient will decrease Quick DASH scores by > 8 points demonstrating reduced self-reported upper extremity disability.    Baseline 02/24/2021: QuickDash Disability/symptom score 40, Work score 37.5; 5/18: Disability/symtpom 38.6, Work 43.75; 7/7: disability/sx score 50%, work 62.5%    Time 12    Period Weeks    Status On-going    Target Date 07/29/21      PT LONG TERM GOAL #3   Title Patient will increase FOTO score to equal to or greater than 60 to demonstrate statistically significant improvement in mobility and quality of life.    Baseline 02/24/2021: 55; 5/18: 60; 7/7: 56    Time 12    Period Weeks    Status Achieved  PT LONG TERM GOAL #4   Title Patient will increase BUE MMT scores by at least 1/2 point as to improve functional  strength and endurance for overhead activities and ADLs such as driving and washing her hair.    Baseline 02/24/2021: L/R shoulder flexion 4/5, 3+/5; shoulder abduction 4/5 B; shoulder extension 4+5 B; shoulder IR/ER both grossly 4/5 B; elbow flexion/extension 5/5 for both B; wrist flexion/extension 5/5 for both B; 5/18: L shoulder is grossly 4+/5.  R shoulder flexion 3+/5; R shoulder abduction 4-/5; R shoulder IR/ER both 4-/5 (B); R elbow flexion 4/5, elbow ext. 5/5 B, shoulder ext. 5/5 B; 7/7:  L shoulder grossly 4+/5, R shoulder 4/5 except shoulder flexion 4-/5, R shoulder IR/ER pain limited to 3/5    Time 12    Period Weeks    Status On-going    Target Date 07/29/21      PT LONG TERM GOAL #5   Title Pt will demonstrate decrease in NDI by at least 19% in order to demonstrate clinically significant reduction in disability related to neck/TMJ injury/pain    Baseline 8/3: 22%    Time 12    Period Weeks    Status New    Target Date 10/14/21      Additional Long Term Goals   Additional Long Term Goals Yes      PT LONG TERM GOAL #6   Title Patient will report decreased worst VAS of jaw to <3/10 for decreased pain with eating and improved quality of life    Baseline 8/3: 6/10    Time 12    Period Weeks    Status New    Target Date 10/14/21      PT LONG TERM GOAL #7   Title Patient will report little to no clicking/catching with chewing for improved quality of life and ADL performance    Baseline 8/3: audible clicking/catching    Time 12    Period Weeks    Status New    Target Date 10/14/21                   Plan - 07/27/21 1712     Clinical Impression Statement Patient introduced and performed TMJ HEP demonstrating understanding, Cueing for arc of movement required due to patient preference for excessive amplitude rather than slow small movements. Patient is highly motivated thoughout session and tolerated therex and manual well. Her TDN was performed with patient reporting  multiple tirgger point relief sites. Pt will benefit from skilled PT services to address deficits and return to pain-free function at home and with caregiving.    Personal Factors and Comorbidities Comorbidity 1;Comorbidity 2;Comorbidity 3+;Past/Current Experience;Social Background;Time since onset of injury/illness/exacerbation;Transportation;Fitness;Other    Comorbidities Pertinent: recent hx of COVID19, HTN, hyperlipidemia, pre-diabetes, AFIB, CHF, depression, anxiety, OA, atypical migraine, asthma.    Examination-Activity Limitations Bathing;Bed Mobility;Hygiene/Grooming;Lift;Caring for Others;Reach Overhead;Carry;Dressing;Sleep;Other;Self Feeding    Examination-Participation Restrictions Laundry;Cleaning;Driving;Community Activity;Other;Yard Work;Shop;Meal Prep;Volunteer    Stability/Clinical Decision Making Stable/Uncomplicated    Rehab Potential Good    PT Frequency 2x / week    PT Duration 12 weeks    PT Treatment/Interventions ADLs/Self Care Home Management;Biofeedback;Cryotherapy;Electrical Stimulation;Moist Heat;Traction;Ultrasound;DME Instruction;Functional mobility training;Therapeutic activities;Therapeutic exercise;Neuromuscular re-education;Patient/family education;Orthotic Fit/Training;Manual techniques;Scar mobilization;Passive range of motion;Dry needling;Taping;Energy conservation;Joint Manipulations;Spinal Manipulations;Gait training;Visual/perceptual remediation/compensation    PT Next Visit Plan recert shoulder    PT Home Exercise Plan Issued HEP with handout: RTB rows, RTB shoulder ER pull-aparts, and upper trap stretch; shoulder perturbation exercises, ROM for shoulder IR within pain-free  range, mobilizations/distraction to R shoulder, continue training correct lifting mechanics in order to reduce strain on RUE, lifting mechanics    Consulted and Agree with Plan of Care Patient             Patient will benefit from skilled therapeutic intervention in order to improve the  following deficits and impairments:  Increased fascial restricitons, Improper body mechanics, Pain, Decreased mobility, Increased muscle spasms, Postural dysfunction, Decreased activity tolerance, Decreased endurance, Decreased range of motion, Decreased strength, Hypomobility, Impaired UE functional use, Impaired flexibility, Decreased coordination, Impaired tone  Visit Diagnosis: Cervicalgia  Muscle weakness (generalized)  Chronic right shoulder pain  Abnormal posture     Problem List Patient Active Problem List   Diagnosis Date Noted   Medicare annual wellness visit, initial 07/17/2021   TMJ (dislocation of temporomandibular joint) 07/17/2021   Encounter for screening mammogram for breast cancer 06/05/2021   Diarrhea 04/11/2020   Epigastric pain 02/19/2020   Dark stools 02/19/2020   PAF (paroxysmal atrial fibrillation) ([place]) 02/05/2019   Shortness of breath 01/27/2019   Irregular surface of cornea 12/25/2018   HPV (human papilloma virus) infection 12/01/2016   Screening mammogram, encounter for 11/29/2016   Estrogen deficiency 11/29/2016   Encounter for routine gynecological examination 11/29/2016   Grade III hemorrhoids 10/06/2016   Tachycardia 08/14/2016   Hemorrhoids 08/13/2016   Atypical migraine 08/03/2016   CAD (coronary artery disease)    AAA (abdominal aortic aneurysm) (HCC)    Chronic combined systolic (congestive) and diastolic (congestive) heart failure ([place])    Prediabetes 02/11/2016   Generalized anxiety disorder 12/08/2015   Panic attacks 07/28/2015   Sensorineural hearing loss 03/27/2013   Vitamin D deficiency 07/03/2012   H/O aortic dissection 05/12/2012   Routine general medical examination at a health care facility 09/20/2011   S/P AVR (aortic valve replacement) 01/07/2011   CORONARY ARTERY BYPASS GRAFT, HX OF 01/07/2011   Arthritis 12/20/2010   Back pain 12/15/2010   H/O dissecting abdominal aortic aneurysm repair 08/24/2010   IRRITABLE BOWEL  SYNDROME 09/09/2009   Obstructive sleep apnea 08/04/2009   External hemorrhoid 06/26/2009   Osteoporosis 01/09/2009   Hyperlipidemia 07/26/2007   Depression with anxiety 07/26/2007   Essential hypertension 07/26/2007   Allergic rhinitis 07/26/2007   Asthma 07/26/2007   GERD 07/26/2007   Osteoarthritis 07/26/2007   Ocular migraine 07/26/2007   Mild intermittent asthma without complication 60/47/9987   Janna Arch, PT, DPT  07/27/2021, 5:13 PM  [facility] [facility] MAIN [facility] SERVICES [address] [place], [place], [address] Phone: [phone]   Fax:  [phone]  Name: ADELISA SATTERWHITE MRN: [id number] Date of Birth: September 15, 1956

## 2021-07-29 ENCOUNTER — Other Ambulatory Visit: Payer: Self-pay

## 2021-07-29 ENCOUNTER — Ambulatory Visit: Payer: Medicare HMO

## 2021-07-29 DIAGNOSIS — M542 Cervicalgia: Secondary | ICD-10-CM | POA: Diagnosis not present

## 2021-07-29 DIAGNOSIS — R293 Abnormal posture: Secondary | ICD-10-CM

## 2021-07-29 DIAGNOSIS — G8929 Other chronic pain: Secondary | ICD-10-CM | POA: Diagnosis not present

## 2021-07-29 DIAGNOSIS — M25511 Pain in right shoulder: Secondary | ICD-10-CM | POA: Diagnosis not present

## 2021-07-29 DIAGNOSIS — M6281 Muscle weakness (generalized): Secondary | ICD-10-CM

## 2021-07-29 NOTE — Therapy (Signed)
[place] [facility] [address] [place], [place], [address] Phone: [phone]   Fax:  [phone]  Physical Therapy Treatment /RECERT  Patient Details  Name: SHELSEY RIETH MRN: [id number] Date of Birth: [birth year] Referring Provider (PT): Gregor Hams, MD   Encounter Date: 07/30/2063   PT End of Session - 07/29/21 1443     Visit Number 29    Number of Visits 75    Date for PT Re-Evaluation 10/21/21    Authorization Type Eval performed 03/20/6605 (recert for 3/0/1601); new recert for 0/93/2355    PT Start Time 1430    PT Stop Time 1514    PT Time Calculation (min) 44 min    Activity Tolerance Patient tolerated treatment well    Behavior During Therapy [facility] for tasks assessed/performed             Past Medical History:  Diagnosis Date   AAA (abdominal aortic aneurysm) ([place])    a. Chronic w/o evidence of aneurysmal dil on CTA 01/2016.   Alcohol abuse    Allergy    Anemia    Anxiety    CAD (coronary artery disease)    a. 08/2010 s/p CABG x 1 (VG->RCA) @ time of Ao dissection repair; b. 01/2016 Lexiscan MV: mid antsept/apical defect w/ ? peri-infarct ischemia-->likely attenuation-->Med Rx.   Chewing difficulty    Chronic combined systolic (congestive) and diastolic (congestive) heart failure ([place])    a. 2012 EF 30-35%; b. 04/2015 EF 40-45%; c. 01/2016 Echo: Ef 55-65%, nl AoV bioprosthesis, nl RV, nl PASP.   Constipation    Depression    Edema of both lower extremities    Gallbladder sludge    GERD (gastroesophageal reflux disease)    Heart failure (HCC)    Heart valve problem    History of Bicuspid Aortic Valve    a. 08/2010 s/p AVR @ time of Ao dissection repair; b. 01/2016 Echo: Ef 55-65%, nl AoV bioprosthesis, nl RV, nl PASP.   Hx of repair of dissecting thoracic aortic aneurysm, Stanford type A    a. 08/2010 s/p repair with AVR and VG->RCA; b. 01/2016 CTA: stable appearance of Asc Thoracic Aortic graft. Opacification  of flase lumen of chronic abd Ao dissection w/ retrograde flow through lumbar arteries. No aneurysmal dil of flase lumen.   Hyperlipidemia    Hypertensive heart disease    IBS (irritable bowel syndrome)    Internal hemorrhoids    Kidney problem    Marfan syndrome    pt denies   Migraine    Obstructive sleep apnea    Osteoarthritis    Osteoporosis    Palpitations    Pneumonia    RA (rheumatoid arthritis) ([place])    a. Followed by Dr. [name]    Past Surgical History:  Procedure Laterality Date   ABDOMINAL AORTIC ANEURYSM REPAIR     CARDIAC CATHETERIZATION  2001   [place]     ESOPHAGOGASTRODUODENOSCOPY (EGD) WITH PROPOFOL N/A 05/08/2020   Procedure: ESOPHAGOGASTRODUODENOSCOPY (EGD) WITH PROPOFOL;  Surgeon: Virgel Manifold, MD;  Location: [facility] ENDOSCOPY;  Service: Endoscopy;  Laterality: N/A;   FRACTURE SURGERY Right    shoulder replacement   HEMORRHOID BANDING     ORIF DISTAL RADIUS FRACTURE Left    UPPER GASTROINTESTINAL ENDOSCOPY      There were no vitals filed for this visit.   Subjective Assessment - 07/29/21 1713  Subjective Patient reports having a flare up of her shoulder due to having overdone it. Reports she has been compliant with HEP but is doing/attempting to do more with shoulder.    Pertinent History Pt is a 65 y/o female presenting to therapy due to R shoulder pain. Pt had R shoulder replacement 12-13 years ago.  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 She says shoulder pain started this past January as she was helping lift her husband and his assistive devices. She reports she has since stopped lifting him and his walker as they have caregivers who come to the home for 16 hours/week and can help. Pt is most concerned that her shoulder hurts when she drives and washes her hair. Her shoulder does not hurt constantly, but she states she has pain "the more I use  it." She feels a catch and stabbing sensation in R anterior shoulder that can travel into bicep and that sometimes the pain is a burning sensation. She sometimes has pain in L shoulder.  She rates her worst R shoulder pain as 10/10, best pain is 0/10. Her R shoulder pain is currently 1.5/10. She reports that rest, ice and naproxen help with pain relief. She denies redness/swelling/temp difference and N/T in UEs.  Patient has been seen by other therapists in this clinic previously for cervicalgia and back related injuries/pain. Pt PMH includes COVID19, upper GI stomach issues, aorta type A dissection (2011) with aortic valve replacement, bypass of R coronary artery, HTN, hyperlipidemia, pre-diabetes, AFIB, CHF, depression, anxiety, OA, GERD, atypical migraine, asthma, and allergic rhinitis. Additional diagnosis of TMJ has been added with patient reporting pain has been occuring for ~40 years, worsens with stress and grinding of teeth.    Limitations Lifting;House hold activities;Other (comment)    How long can you sit comfortably? Not affected    How long can you stand comfortably? Not affected    How long can you walk comfortably? Pt reports she has pain only if she is carrying something    Diagnostic tests DG R shoulder 02/09/2021 per chart "Impression: Possible hardware loosening post total shoulder arthroplasty. No  evidence of acute fracture or dislocation"    Patient Stated Goals Be able to drive without pain    Currently in Pain? Yes    Pain Score 4     Pain Location Shoulder    Pain Orientation Right    Pain Descriptors / Indicators Aching    Pain Type Chronic pain    Pain Onset More than a month ago    Pain Frequency Intermittent                 Goals:  VAS in past 7 days: 5/10; reaching overhead/overdoing it Quick dash: 37.7% caregiver work 37.5% FOTO 56%  BUE MMT   Right Left  Upper trap 4 4+  Biceps 4 4+  Triceps 4 4+  Shoulder flexion 4 4+  Shoulder extension 4 4+   Shoulder Abd 4 4+   Patient reports is not as strong as she wants to be. Is having more pain recently due to trying to attempt more activities.      TherEx   Supine:  Scapular punches 15x with cue for scapular retraction Scapular stabilization against PT perturbation with RTB x60 seconds x 2 trials  RTB IR 15x against PT resistance RTB bicep curl 15x against PT resistance RTB tricep extension 15x against PT resistance   Sidelying:  ER: terminated due to pain Shoulder abduction 10x  Standing: Ball punch against wall for scapular control 10x Wall ball circles with patient reporting severe fatigue by last set 10x    Manual: AP right shoulder mobilization 4x 15 second grade II mobilizations Inferior right shoulder mobilization 4x 15 second grade II mobilizations. Inferior distraction x 4 with 5 second holds     Patient's goal performance this session is limited by recent flare up from patient overdoing it. Her pain was improving prior this week and was having less frequent flare ups however this week they have returned in frequency and severity. Patient will benefit from additional recert despite/because of recent flare up to reduce pain and improve function as patient is primary caregiver at this time. Her quickdash is improving in terms of form as a whole and caregiver work portion . Pt will benefit from skilled PT services to address deficits and return to pain-free function at home and with caregiving.                    PT Education - 07/29/21 1442     Education Details goals, POC    Person(s) Educated Patient    Methods Explanation;Demonstration;Tactile cues;Verbal cues    Comprehension Verbalized understanding;Returned demonstration;Verbal cues required;Tactile cues required              PT Short Term Goals - 07/29/21 1445       PT SHORT TERM GOAL #1   Title Patient will be independent in home exercise program to improve strength/mobility for  better functional independence with ADLs.    Baseline 02/24/2021 Pt issued HEP; 5/19: HEP to be advanced, pt has been unable to perform HEP consistently d/t spouse surgery and son's wedding.; 7/7: pt reports she has not been performing HEP "as often as I'm supposed to." 8/10: HEP compliant    Time 6    Period Weeks    Status Achieved    Target Date 08/06/21               PT Long Term Goals - 07/29/21 1446       PT LONG TERM GOAL #1   Title Patient will report a worst pain of 3/10  in last 7 days in her R shoulder to improve tolerance with ADLs and reduced symptoms with activities.    Baseline 02/24/2021: Pt reports worst pain as 10/10.; 5/18: 4/10 is worst pain; 7/7: pt reports worst pain 5/10 8/10: 7/10 pain    Time 12    Period Weeks    Status Partially Met    Target Date 10/21/21      PT LONG TERM GOAL #2   Title Patient will decrease Quick DASH scores by > 8 points demonstrating reduced self-reported upper extremity disability.    Baseline 02/24/2021: QuickDash Disability/symptom score 40, Work score 37.5; 5/18: Disability/symtpom 38.6, Work 43.75; 7/7: disability/sx score 50%, work 62.5% 8/10: 37.7% caregiver work 37.5%    Time 12    Period Weeks    Status Partially Met    Target Date 10/21/21      PT LONG TERM GOAL #3   Title Patient will increase FOTO score to equal to or greater than 60 to demonstrate statistically significant improvement in mobility and quality of life.    Baseline 02/24/2021: 55; 5/18: 60; 7/7: 56 8/10: 56%    Time 12    Period Weeks    Status Achieved      PT LONG TERM GOAL #  4   Title Patient will increase BUE MMT scores by at least 1/2 point as to improve functional strength and endurance for overhead activities and ADLs such as driving and washing her hair.    Baseline 02/24/2021: L/R shoulder flexion 4/5, 3+/5; shoulder abduction 4/5 B; shoulder extension 4+5 B; shoulder IR/ER both grossly 4/5 B; elbow flexion/extension 5/5 for both B; wrist  flexion/extension 5/5 for both B; 5/18: L shoulder is grossly 4+/5.  R shoulder flexion 3+/5; R shoulder abduction 4-/5; R shoulder IR/ER both 4-/5 (B); R elbow flexion 4/5, elbow ext. 5/5 B, shoulder ext. 5/5 B; 7/7:  L shoulder grossly 4+/5, R shoulder 4/5 except shoulder flexion 4-/5, R shoulder IR/ER pain limited to 3/5 8/10: grossly 4/5 with IR/ER painful limiting full range    Time 12    Period Weeks    Status Partially Met    Target Date 10/21/21      PT LONG TERM GOAL #5   Title Pt will demonstrate decrease in NDI by at least 19% in order to demonstrate clinically significant reduction in disability related to neck/TMJ injury/pain    Baseline 8/3: 22%    Time 12    Period Weeks    Status New    Target Date 10/21/21      PT LONG TERM GOAL #6   Title Patient will report decreased worst VAS of jaw to <3/10 for decreased pain with eating and improved quality of life    Baseline 8/3: 6/10    Time 12    Period Weeks    Status New    Target Date 10/21/21      PT LONG TERM GOAL #7   Title Patient will report little to no clicking/catching with chewing for improved quality of life and ADL performance    Baseline 8/3: audible clicking/catching    Time 12    Period Weeks    Status New    Target Date 10/21/21                   Plan - 07/29/21 1721     Clinical Impression Statement Patient's goal performance this session is limited by recent flare up from patient overdoing it. Her pain was improving prior this week and was having less frequent flare ups however this week they have returned in frequency and severity. Patient will benefit from additional recert despite/because of recent flare up to reduce pain and improve function as patient is primary caregiver at this time. Her quickdash is improving in terms of form as a whole and caregiver work portion . Pt will benefit from skilled PT services to address deficits and return to pain-free function at home and with caregiving.     Personal Factors and Comorbidities Comorbidity 1;Comorbidity 2;Comorbidity 3+;Past/Current Experience;Social Background;Time since onset of injury/illness/exacerbation;Transportation;Fitness;Other    Comorbidities Pertinent: recent hx of COVID19, HTN, hyperlipidemia, pre-diabetes, AFIB, CHF, depression, anxiety, OA, atypical migraine, asthma.    Examination-Activity Limitations Bathing;Bed Mobility;Hygiene/Grooming;Lift;Caring for Others;Reach Overhead;Carry;Dressing;Sleep;Other;Self Feeding    Examination-Participation Restrictions Laundry;Cleaning;Driving;Community Activity;Other;Yard Work;Shop;Meal Prep;Volunteer    Stability/Clinical Decision Making Stable/Uncomplicated    Rehab Potential Good    PT Frequency 2x / week    PT Duration 12 weeks    PT Treatment/Interventions ADLs/Self Care Home Management;Biofeedback;Cryotherapy;Electrical Stimulation;Moist Heat;Traction;Ultrasound;DME Instruction;Functional mobility training;Therapeutic activities;Therapeutic exercise;Neuromuscular re-education;Patient/family education;Orthotic Fit/Training;Manual techniques;Scar mobilization;Passive range of motion;Dry needling;Taping;Energy conservation;Joint Manipulations;Spinal Manipulations;Gait training;Visual/perceptual remediation/compensation    PT Next Visit Plan recert shoulder    PT Home Exercise Plan Issued HEP with  handout: RTB rows, RTB shoulder ER pull-aparts, and upper trap stretch; shoulder perturbation exercises, ROM for shoulder IR within pain-free range, mobilizations/distraction to R shoulder, continue training correct lifting mechanics in order to reduce strain on RUE, lifting mechanics    Consulted and Agree with Plan of Care Patient             Patient will benefit from skilled therapeutic intervention in order to improve the following deficits and impairments:  Increased fascial restricitons, Improper body mechanics, Pain, Decreased mobility, Increased muscle spasms, Postural  dysfunction, Decreased activity tolerance, Decreased endurance, Decreased range of motion, Decreased strength, Hypomobility, Impaired UE functional use, Impaired flexibility, Decreased coordination, Impaired tone  Visit Diagnosis: Cervicalgia  Muscle weakness (generalized)  Chronic right shoulder pain  Abnormal posture     Problem List Patient Active Problem List   Diagnosis Date Noted   Medicare annual wellness visit, initial 07/17/2021   TMJ (dislocation of temporomandibular joint) 07/17/2021   Encounter for screening mammogram for breast cancer 06/05/2021   Diarrhea 04/11/2020   Epigastric pain 02/19/2020   Dark stools 02/19/2020   PAF (paroxysmal atrial fibrillation) ([place]) 02/05/2019   Shortness of breath 01/27/2019   Irregular surface of cornea 12/25/2018   HPV (human papilloma virus) infection 12/01/2016   Screening mammogram, encounter for 11/29/2016   Estrogen deficiency 11/29/2016   Encounter for routine gynecological examination 11/29/2016   Grade III hemorrhoids 10/06/2016   Tachycardia 08/14/2016   Hemorrhoids 08/13/2016   Atypical migraine 08/03/2016   CAD (coronary artery disease)    AAA (abdominal aortic aneurysm) (H[facility])    Chronic combined systolic (congestive) and diastolic (congestive) heart failure ([place])    Prediabetes 02/11/2016   Generalized anxiety disorder 12/08/2015   Panic attacks 07/28/2015   Sensorineural hearing loss 03/27/2013   Vitamin D deficiency 07/03/2012   H/O aortic dissection 05/12/2012   Routine general medical examination at a health care facility 09/20/2011   S/P AVR (aortic valve replacement) 01/07/2011   CORONARY ARTERY BYPASS GRAFT, HX OF 01/07/2011   Arthritis 12/20/2010   Back pain 12/15/2010   H/O dissecting abdominal aortic aneurysm repair 08/24/2010   IRRITABLE BOWEL SYNDROME 09/09/2009   Obstructive sleep apnea 08/04/2009   External hemorrhoid 06/26/2009   Osteoporosis 01/09/2009   Hyperlipidemia 07/26/2007    Depression with anxiety 07/26/2007   Essential hypertension 07/26/2007   Allergic rhinitis 07/26/2007   Asthma 07/26/2007   GERD 07/26/2007   Osteoarthritis 07/26/2007   Ocular migraine 07/26/2007   Mild intermittent asthma without complication 44/46/1901    Janna Arch, PT, DPT  07/29/2021, 5:22 PM  [facility] [address] [place], [place], [address] Phone: [phone]   Fax:  [phone]  Name: KIELA SHISLER MRN: [id number] Date of Birth: 05/23/1956

## 2021-08-03 ENCOUNTER — Ambulatory Visit: Payer: Medicare HMO

## 2021-08-03 ENCOUNTER — Other Ambulatory Visit: Payer: Self-pay

## 2021-08-03 ENCOUNTER — Ambulatory Visit (INDEPENDENT_AMBULATORY_CARE_PROVIDER_SITE_OTHER): Payer: Medicare HMO | Admitting: Psychologist

## 2021-08-03 DIAGNOSIS — R293 Abnormal posture: Secondary | ICD-10-CM | POA: Diagnosis not present

## 2021-08-03 DIAGNOSIS — G8929 Other chronic pain: Secondary | ICD-10-CM | POA: Diagnosis not present

## 2021-08-03 DIAGNOSIS — F411 Generalized anxiety disorder: Secondary | ICD-10-CM | POA: Diagnosis not present

## 2021-08-03 DIAGNOSIS — M6281 Muscle weakness (generalized): Secondary | ICD-10-CM

## 2021-08-03 DIAGNOSIS — M25511 Pain in right shoulder: Secondary | ICD-10-CM | POA: Diagnosis not present

## 2021-08-03 DIAGNOSIS — M542 Cervicalgia: Secondary | ICD-10-CM | POA: Diagnosis not present

## 2021-08-04 IMAGING — CT CT ANGIOGRAPHY CHEST
3 of 8 series · 12 of 36 positions shown · IV contrast (iopamidol)
Comparison: 08/08/2016

CLINICAL DATA: Thoracic aortic dissection, post repair and bypass.
Follow-up.

EXAM:
CT ANGIOGRAPHY CHEST, ABDOMEN AND PELVIS
TECHNIQUE: Multidetector CT imaging through the chest, abdomen and pelvis was
performed using the standard protocol during bolus administration of
intravenous contrast. Multiplanar reconstructed images and MIPs were
obtained and reviewed to evaluate the vascular anatomy.
CONTRAST:  75mL SSONPT-125 IOPAMIDOL (SSONPT-125) INJECTION 76%

[Series 9: cta cap 2.00 bv36 s3 axial arterial · axial · arterial · 0.66mm/px · z∈[+1291,+1813]mm · 9 of 327 slices shown]
[im 33/327  lung]
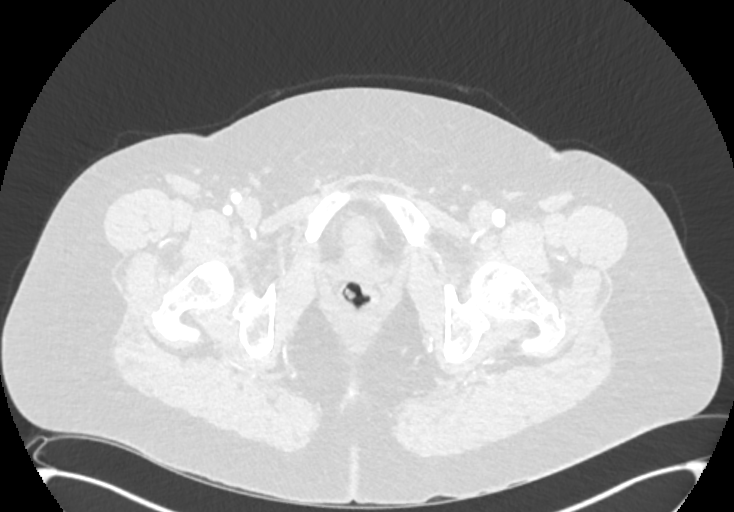
[im 66/327  mediastinal]
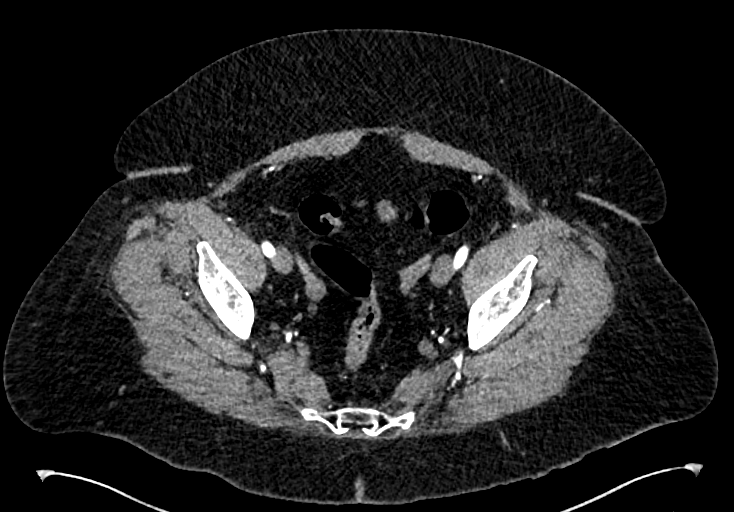
[im 98/327  lung]
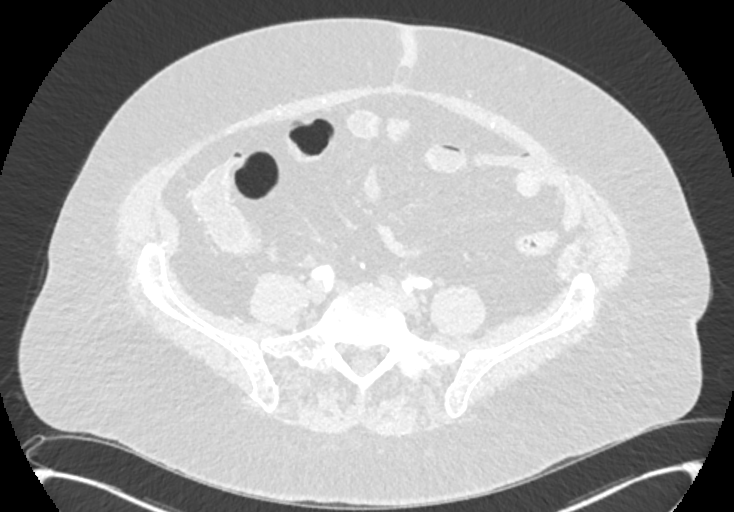
[im 131/327  mediastinal]
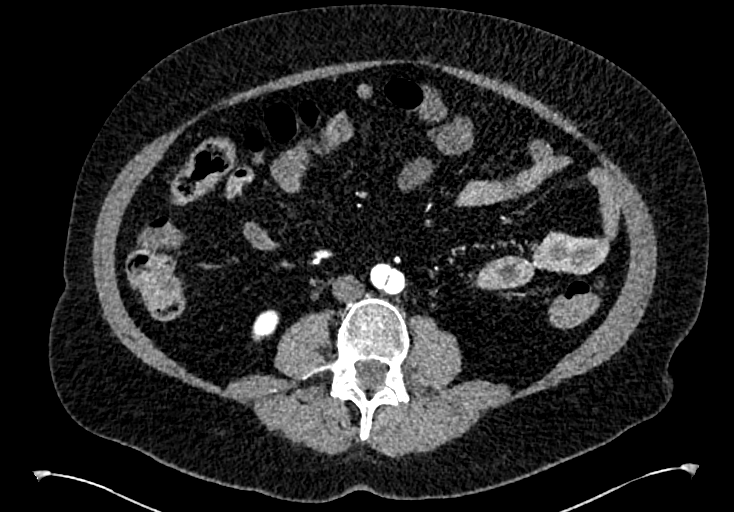
[im 164/327  lung]
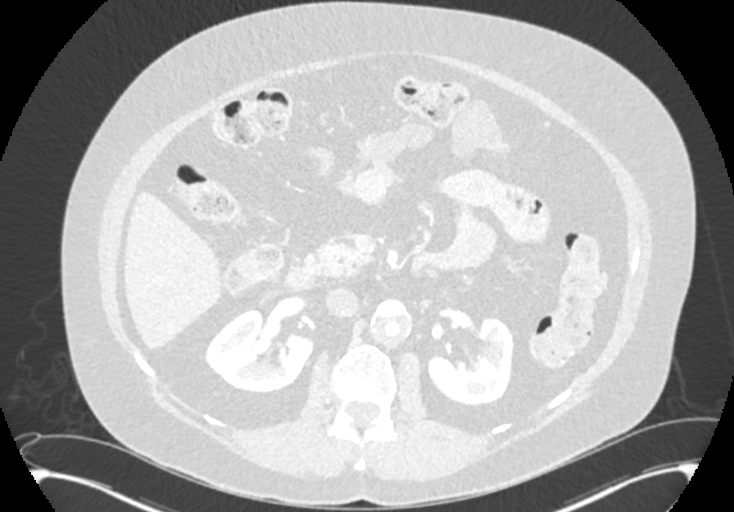
[im 196/327  mediastinal]
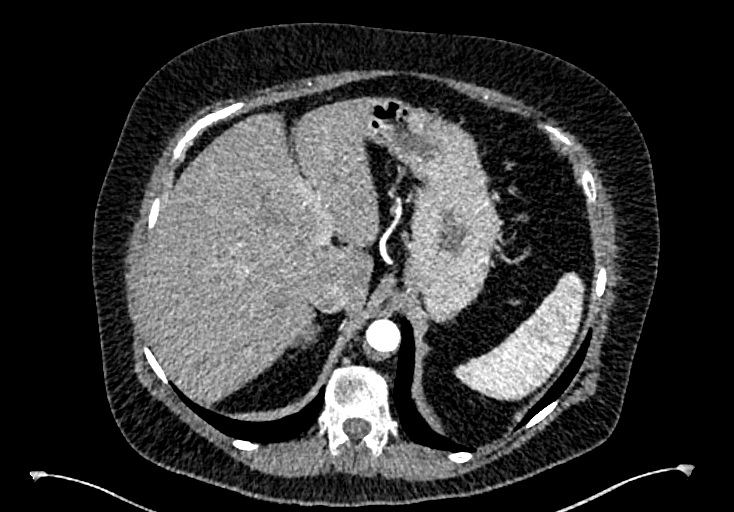
[im 229/327  lung]
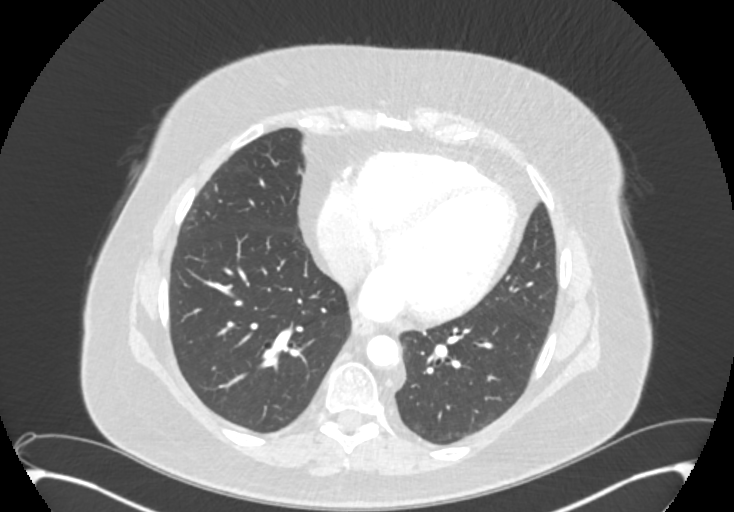
[im 261/327  mediastinal]
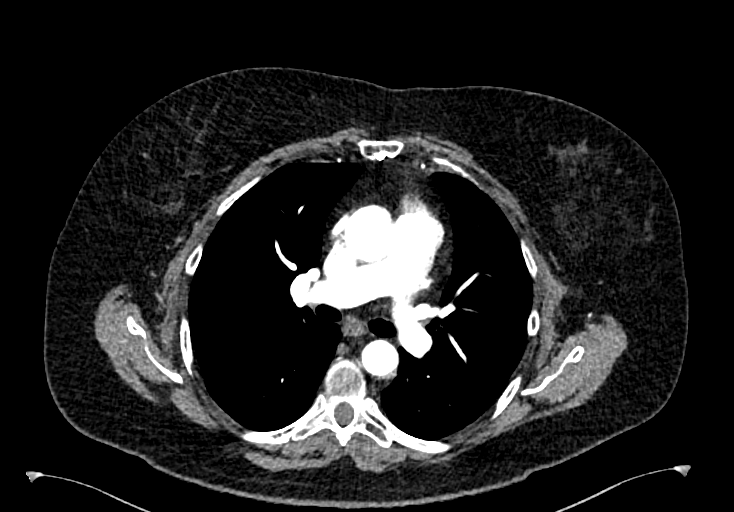
[im 294/327  lung]
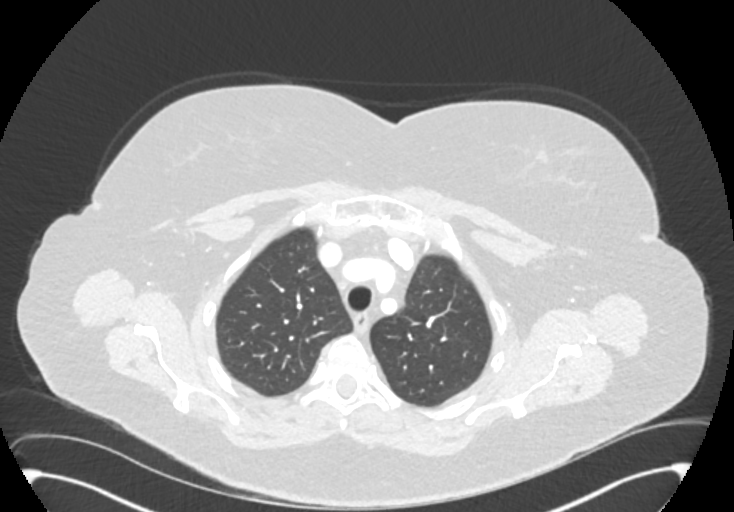

[Series 12: cta cap 2.00 bv36 s3 cor st · coronal · 0.94mm/px · 1 of 167 slices shown]
[im 84/167  mediastinal]
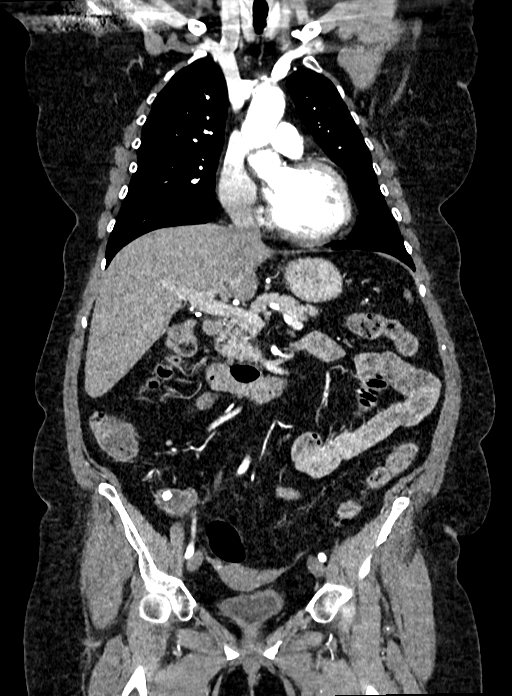

[Series 20: cta cap 2.00 br60 s3 lung arterial · axial · arterial · 0.65mm/px · z∈[+1643,+1721]mm · 2 of 156 slices shown]
[im 39/156  mediastinal]
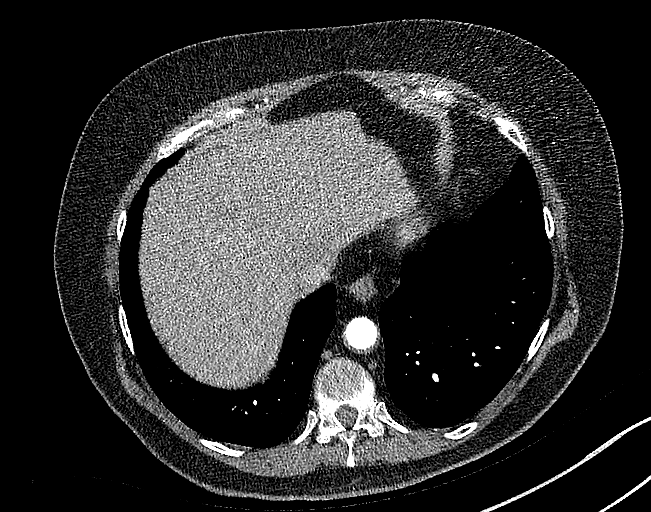
[im 78/156  mediastinal]
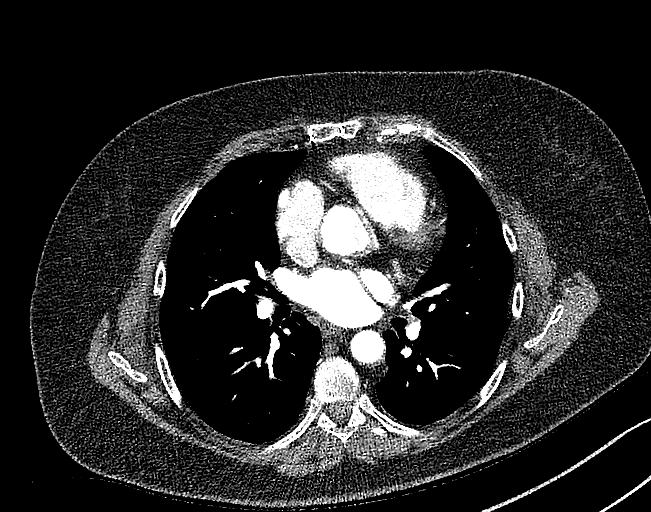

[12 of 36 positions shown; findings below may reference images not displayed]

FINDINGS: CTA CHEST FINDINGS

Cardiovascular: Heart size normal. No pericardial effusion.
Satisfactory opacification of pulmonary arteries noted, and there is
no evidence of pulmonary emboli. Ascending aortic tube graft without
complicating features. Coronary bypass grafts x2 remain patent.
Bovine variant brachiocephalic arterial origin anatomy without
proximal stenosis. Calcified atheromatous plaque in the arch and
descending thoracic segment. Dissection flap is evident in the
distal descending thoracic segment with incomplete opacification of
the false lumen, similar to that seen on prior study. There is no
compromise of the true lumen.

Mediastinum/Nodes: No hilar or mediastinal adenopathy.

Lungs/Pleura: No pleural effusion. No pneumothorax. Stable
intrapulmonary lymph node adjacent to the left major fissure image
80/7. Lungs are otherwise clear.

Musculoskeletal: Right shoulder prosthesis. Sternotomy wires.
Chronic mild T12 wedge deformity. No fracture or worrisome bone
lesion.

Review of the MIP images confirms the above findings.

CTA ABDOMEN AND PELVIS FINDINGS

VASCULAR

Aorta: Persistence of the dissection flap through the length of the
abdominal aorta. There is mild narrowing of the true lumen which is
stable. Partial thrombosis of the false lumen, similar to prior
study. Mild fusiform dilatation of the infrarenal aorta up to
cm, previously 3 cm. No evidence of rupture.

Celiac: Patent, supplied by true lumen.

SMA: Patent, supplied by true lumen

Renals: Single left, supplied by true lumen, with early bifurcation.

Single right, supplied by true lumen, widely patent.

IMA: Supplied by true lumen. Widely patent.

Inflow: The dissection flap extends through the length of the left
common iliac artery with patency of true and false lumens. There is
fusiform dilatation of the left common iliac artery up to 1.6 cm
diameter. Left external and internal iliac arteries unremarkable,
without dissection involvement.

Right iliac arterial system unremarkable, without involvement by
dissection.

Veins: No obvious venous abnormality within the limitations of this
arterial phase study. Patent portal and renal veins.

Review of the MIP images confirms the above findings.

NON-VASCULAR

Hepatobiliary: No focal liver abnormality is seen. Status post
cholecystectomy. No biliary dilatation.

Pancreas: Unremarkable. No pancreatic ductal dilatation or
surrounding inflammatory changes.

Spleen: Normal in size without focal abnormality.

Adrenals/Urinary Tract: Normal adrenals. Normal kidneys. No
hydronephrosis. Urinary bladder nondistended.

Stomach/Bowel: Stomach is nondistended. Distal duodenal
diverticulum. Small bowel decompressed. Appendix not discretely
identified. Lipomatous ileocecal valve. Colon is nondilated,
unremarkable.

Lymphatic: No abdominal or pelvic adenopathy.

Reproductive: Uterus and bilateral adnexa are unremarkable.

Other: No ascites.  No free air.

Musculoskeletal: No acute or significant osseous findings.

Review of the MIP images confirms the above findings.
IMPRESSION: 1. Stable appearance of ascending aortic tube graft repair without
complicating features.
2. Stable dissection flap in the distal descending thoracic and
abdominal aorta, extending through left common iliac artery.
3. 3.2 cm infrarenal abdominal aortic aneurysm (previously 3 cm).
Recommend followup by ultrasound in 3 years. This recommendation
follows ACR consensus guidelines: White Paper of the ACR Incidental
4. Stable 1.6 cm left common iliac artery aneurysm.

## 2021-08-04 NOTE — Therapy (Signed)
[place] [facility] SERVICES [address] [place], [place], [address] Phone: [phone]   Fax:  [phone]  Physical Therapy Treatment Physical Therapy Progress Note   Dates of reporting period  06/25/21   to   08/03/21   Patient Details  Name: Rachel Torres MRN: [id number] Date of Birth: [birth year] Referring Provider (PT): Gregor Hams, MD   Encounter Date: 08/03/2021   PT End of Session - 08/04/21 1052     Visit Number 30    Number of Visits 28    Date for PT Re-Evaluation 10/21/21    Authorization Type Eval performed 06/26/6766 (recert for 2/0/9470); new recert for 9/62/8366    Authorization Time Period next sessoin 1/10 PN 8/15    PT Start Time 1430    PT Stop Time 1516    PT Time Calculation (min) 46 min    Activity Tolerance Patient tolerated treatment well    Behavior During Therapy [facility] for tasks assessed/performed             Past Medical History:  Diagnosis Date   AAA (abdominal aortic aneurysm) ([place])    a. Chronic w/o evidence of aneurysmal dil on CTA 01/2016.   Alcohol abuse    Allergy    Anemia    Anxiety    CAD (coronary artery disease)    a. 08/2010 s/p CABG x 1 (VG->RCA) @ time of Ao dissection repair; b. 01/2016 Lexiscan MV: mid antsept/apical defect w/ ? peri-infarct ischemia-->likely attenuation-->Med Rx.   Chewing difficulty    Chronic combined systolic (congestive) and diastolic (congestive) heart failure ([place])    a. 2012 EF 30-35%; b. 04/2015 EF 40-45%; c. 01/2016 Echo: Ef 55-65%, nl AoV bioprosthesis, nl RV, nl PASP.   Constipation    Depression    Edema of both lower extremities    Gallbladder sludge    GERD (gastroesophageal reflux disease)    Heart failure (HCC)    Heart valve problem    History of Bicuspid Aortic Valve    a. 08/2010 s/p AVR @ time of Ao dissection repair; b. 01/2016 Echo: Ef 55-65%, nl AoV bioprosthesis, nl RV, nl PASP.   Hx of repair of dissecting thoracic aortic aneurysm,  Stanford type A    a. 08/2010 s/p repair with AVR and VG->RCA; b. 01/2016 CTA: stable appearance of Asc Thoracic Aortic graft. Opacification of flase lumen of chronic abd Ao dissection w/ retrograde flow through lumbar arteries. No aneurysmal dil of flase lumen.   Hyperlipidemia    Hypertensive heart disease    IBS (irritable bowel syndrome)    Internal hemorrhoids    Kidney problem    Marfan syndrome    pt denies   Migraine    Obstructive sleep apnea    Osteoarthritis    Osteoporosis    Palpitations    Pneumonia    RA (rheumatoid arthritis) ([place])    a. Followed by Dr. [name]    Past Surgical History:  Procedure Laterality Date   ABDOMINAL AORTIC ANEURYSM REPAIR     CARDIAC CATHETERIZATION  2001   [place]     ESOPHAGOGASTRODUODENOSCOPY (EGD) WITH PROPOFOL N/A 05/08/2020   Procedure: ESOPHAGOGASTRODUODENOSCOPY (EGD) WITH PROPOFOL;  Surgeon: Virgel Manifold, MD;  Location: [facility] ENDOSCOPY;  Service: Endoscopy;  Laterality: N/A;   FRACTURE SURGERY Right    shoulder replacement   HEMORRHOID BANDING     ORIF DISTAL RADIUS FRACTURE  Left    UPPER GASTROINTESTINAL ENDOSCOPY      There were no vitals filed for this visit.   Subjective Assessment - 08/04/21 0921     Subjective Patient reports compliance with TMJ interventions. Continues to have popping but less pain with eating.    Pertinent History Pt is a 65 y/o female presenting to therapy due to R shoulder pain. Pt had R shoulder replacement 12-13 years ago.  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 She says shoulder pain started this past January as she was helping lift her husband and his assistive devices. She reports she has since stopped lifting him and his walker as they have caregivers who come to the home for 16 hours/week and can help. Pt is most concerned that her shoulder hurts when she drives and washes her hair. Her  shoulder does not hurt constantly, but she states she has pain "the more I use it." She feels a catch and stabbing sensation in R anterior shoulder that can travel into bicep and that sometimes the pain is a burning sensation. She sometimes has pain in L shoulder.  She rates her worst R shoulder pain as 10/10, best pain is 0/10. Her R shoulder pain is currently 1.5/10. She reports that rest, ice and naproxen help with pain relief. She denies redness/swelling/temp difference and N/T in UEs.  Patient has been seen by other therapists in this clinic previously for cervicalgia and back related injuries/pain. Pt PMH includes COVID19, upper GI stomach issues, aorta type A dissection (2011) with aortic valve replacement, bypass of R coronary artery, HTN, hyperlipidemia, pre-diabetes, AFIB, CHF, depression, anxiety, OA, GERD, atypical migraine, asthma, and allergic rhinitis. Additional diagnosis of TMJ has been added with patient reporting pain has been occuring for ~40 years, worsens with stress and grinding of teeth.    Limitations Lifting;House hold activities;Other (comment)    How long can you sit comfortably? Not affected    How long can you stand comfortably? Not affected    How long can you walk comfortably? Pt reports she has pain only if she is carrying something    Diagnostic tests DG R shoulder 02/09/2021 per chart "Impression: Possible hardware loosening post total shoulder arthroplasty. No  evidence of acute fracture or dislocation"    Patient Stated Goals Be able to drive without pain    Currently in Pain? Yes    Pain Score 2     Pain Location Jaw    Pain Orientation Right;Left    Pain Descriptors / Indicators Aching    Pain Onset More than a month ago    Pain Frequency Intermittent                  Manual:  TMJ:  -gentle anterior and inferior mobilization of bilateral jaw 8x 30 seconds each side  -supine cervical mobilizations with PA mobilizations grade II x 6 minutes   -suboccipital release 3x30 second holds -cervical side bend with overpressure 2x30 second holds -cervical rotation with overpressure 2x 30 second holds  TherEx: Supine jaw protrustion 10x with cue for arc of motion within pain free range Supine jaw abduction 12x within pain free range.   Trigger Point Dry Needling (TDN), unbilled Education performed with patient regarding potential benefit of TDN. Reviewed precautions and risks with patient. Reviewed special precautions/risks over lung fields which include pneumothorax. Reviewed signs and symptoms of pneumothorax and advised pt to go to ER immediately if these  symptoms develop advise them of dry needling treatment. Extensive time spent with pt to ensure full understanding of TDN risks. Pt provided verbal consent to treatment. TDN performed to  with 0.20 x .30 single needle placements with local twitch response (LTR). Pistoning technique utilized. Improved pain-free motion following intervention. Musculature addressed is as followed: masseter bilateral, pterygoid bilateral, temporal region bilateral, R upper trap. X 14 minutes      Pt educated throughout session about proper posture and technique with exercises. Improved exercise technique, movement at target joints, use of target muscles after min to mod verbal, visual, tactile cues  Patient's condition has the potential to improve in response to therapy. Maximum improvement is yet to be obtained. The anticipated improvement is attainable and reasonable in a generally predictable time.  Patient reports she is not yet pain free or able to perform all the tasks she desires.   Patient's goals performed last session, please refer to this note for further details. Patient tolerated manual and therex interventions well. Multiple trigger points released with TDN. Cervical spine is hypomobile and requires gentle mobilizations for reduction of symptoms. Patient's condition has the potential to improve in  response to therapy. Maximum improvement is yet to be obtained. The anticipated improvement is attainable and reasonable in a generally predictable time. Pt will benefit from skilled PT services to address deficits and return to pain-free function at home and with caregiving.               PT Education - 08/04/21 0921     Education Details exercise technique, body mechanics    Person(s) Educated Patient    Methods Explanation;Demonstration;Tactile cues;Verbal cues    Comprehension Verbalized understanding;Returned demonstration;Verbal cues required;Tactile cues required              PT Short Term Goals - 07/29/21 1445       PT SHORT TERM GOAL #1   Title Patient will be independent in home exercise program to improve strength/mobility for better functional independence with ADLs.    Baseline 02/24/2021 Pt issued HEP; 5/19: HEP to be advanced, pt has been unable to perform HEP consistently d/t spouse surgery and son's wedding.; 7/7: pt reports she has not been performing HEP "as often as I'm supposed to." 8/10: HEP compliant    Time 6    Period Weeks    Status Achieved    Target Date 08/06/21               PT Long Term Goals - 07/29/21 1446       PT LONG TERM GOAL #1   Title Patient will report a worst pain of 3/10  in last 7 days in her R shoulder to improve tolerance with ADLs and reduced symptoms with activities.    Baseline 02/24/2021: Pt reports worst pain as 10/10.; 5/18: 4/10 is worst pain; 7/7: pt reports worst pain 5/10 8/10: 7/10 pain    Time 12    Period Weeks    Status Partially Met    Target Date 10/21/21      PT LONG TERM GOAL #2   Title Patient will decrease Quick DASH scores by > 8 points demonstrating reduced self-reported upper extremity disability.    Baseline 02/24/2021: QuickDash Disability/symptom score 40, Work score 37.5; 5/18: Disability/symtpom 38.6, Work 43.75; 7/7: disability/sx score 50%, work 62.5% 8/10: 37.7% caregiver work 37.5%     Time 12    Period Weeks    Status Partially Met    Target  Date 10/21/21      PT LONG TERM GOAL #3   Title Patient will increase FOTO score to equal to or greater than 60 to demonstrate statistically significant improvement in mobility and quality of life.    Baseline 02/24/2021: 55; 5/18: 60; 7/7: 56 8/10: 56%    Time 12    Period Weeks    Status Achieved      PT LONG TERM GOAL #4   Title Patient will increase BUE MMT scores by at least 1/2 point as to improve functional strength and endurance for overhead activities and ADLs such as driving and washing her hair.    Baseline 02/24/2021: L/R shoulder flexion 4/5, 3+/5; shoulder abduction 4/5 B; shoulder extension 4+5 B; shoulder IR/ER both grossly 4/5 B; elbow flexion/extension 5/5 for both B; wrist flexion/extension 5/5 for both B; 5/18: L shoulder is grossly 4+/5.  R shoulder flexion 3+/5; R shoulder abduction 4-/5; R shoulder IR/ER both 4-/5 (B); R elbow flexion 4/5, elbow ext. 5/5 B, shoulder ext. 5/5 B; 7/7:  L shoulder grossly 4+/5, R shoulder 4/5 except shoulder flexion 4-/5, R shoulder IR/ER pain limited to 3/5 8/10: grossly 4/5 with IR/ER painful limiting full range    Time 12    Period Weeks    Status Partially Met    Target Date 10/21/21      PT LONG TERM GOAL #5   Title Pt will demonstrate decrease in NDI by at least 19% in order to demonstrate clinically significant reduction in disability related to neck/TMJ injury/pain    Baseline 8/3: 22%    Time 12    Period Weeks    Status New    Target Date 10/21/21      PT LONG TERM GOAL #6   Title Patient will report decreased worst VAS of jaw to <3/10 for decreased pain with eating and improved quality of life    Baseline 8/3: 6/10    Time 12    Period Weeks    Status New    Target Date 10/21/21      PT LONG TERM GOAL #7   Title Patient will report little to no clicking/catching with chewing for improved quality of life and ADL performance    Baseline 8/3: audible  clicking/catching    Time 12    Period Weeks    Status New    Target Date 10/21/21                   Plan - 08/04/21 1053     Clinical Impression Statement Patient's goals performed last session, please refer to this note for further details. Patient tolerated manual and therex interventions well. Multiple trigger points released with TDN. Cervical spine is hypomobile and requires gentle mobilizations for reduction of symptoms. Patient's condition has the potential to improve in response to therapy. Maximum improvement is yet to be obtained. The anticipated improvement is attainable and reasonable in a generally predictable time. Pt will benefit from skilled PT services to address deficits and return to pain-free function at home and with caregiving.    Personal Factors and Comorbidities Comorbidity 1;Comorbidity 2;Comorbidity 3+;Past/Current Experience;Social Background;Time since onset of injury/illness/exacerbation;Transportation;Fitness;Other    Comorbidities Pertinent: recent hx of COVID19, HTN, hyperlipidemia, pre-diabetes, AFIB, CHF, depression, anxiety, OA, atypical migraine, asthma.    Examination-Activity Limitations Bathing;Bed Mobility;Hygiene/Grooming;Lift;Caring for Others;Reach Overhead;Carry;Dressing;Sleep;Other;Self Feeding    Examination-Participation Restrictions Laundry;Cleaning;Driving;Community Activity;Other;Yard Work;Shop;Meal Prep;Volunteer    Stability/Clinical Decision Making Stable/Uncomplicated    Rehab Potential Good  PT Frequency 2x / week    PT Duration 12 weeks    PT Treatment/Interventions ADLs/Self Care Home Management;Biofeedback;Cryotherapy;Electrical Stimulation;Moist Heat;Traction;Ultrasound;DME Instruction;Functional mobility training;Therapeutic activities;Therapeutic exercise;Neuromuscular re-education;Patient/family education;Orthotic Fit/Training;Manual techniques;Scar mobilization;Passive range of motion;Dry needling;Taping;Energy  conservation;Joint Manipulations;Spinal Manipulations;Gait training;Visual/perceptual remediation/compensation    PT Next Visit Plan recert shoulder    PT Home Exercise Plan Issued HEP with handout: RTB rows, RTB shoulder ER pull-aparts, and upper trap stretch; shoulder perturbation exercises, ROM for shoulder IR within pain-free range, mobilizations/distraction to R shoulder, continue training correct lifting mechanics in order to reduce strain on RUE, lifting mechanics    Consulted and Agree with Plan of Care Patient             Patient will benefit from skilled therapeutic intervention in order to improve the following deficits and impairments:  Increased fascial restricitons, Improper body mechanics, Pain, Decreased mobility, Increased muscle spasms, Postural dysfunction, Decreased activity tolerance, Decreased endurance, Decreased range of motion, Decreased strength, Hypomobility, Impaired UE functional use, Impaired flexibility, Decreased coordination, Impaired tone  Visit Diagnosis: Cervicalgia  Muscle weakness (generalized)  Abnormal posture     Problem List Patient Active Problem List   Diagnosis Date Noted   Medicare annual wellness visit, initial 07/17/2021   TMJ (dislocation of temporomandibular joint) 07/17/2021   Encounter for screening mammogram for breast cancer 06/05/2021   Diarrhea 04/11/2020   Epigastric pain 02/19/2020   Dark stools 02/19/2020   PAF (paroxysmal atrial fibrillation) ([place]) 02/05/2019   Shortness of breath 01/27/2019   Irregular surface of cornea 12/25/2018   HPV (human papilloma virus) infection 12/01/2016   Screening mammogram, encounter for 11/29/2016   Estrogen deficiency 11/29/2016   Encounter for routine gynecological examination 11/29/2016   Grade III hemorrhoids 10/06/2016   Tachycardia 08/14/2016   Hemorrhoids 08/13/2016   Atypical migraine 08/03/2016   CAD (coronary artery disease)    AAA (abdominal aortic aneurysm) (HCC)     Chronic combined systolic (congestive) and diastolic (congestive) heart failure ([place])    Prediabetes 02/11/2016   Generalized anxiety disorder 12/08/2015   Panic attacks 07/28/2015   Sensorineural hearing loss 03/27/2013   Vitamin D deficiency 07/03/2012   H/O aortic dissection 05/12/2012   Routine general medical examination at a health care facility 09/20/2011   S/P AVR (aortic valve replacement) 01/07/2011   CORONARY ARTERY BYPASS GRAFT, HX OF 01/07/2011   Arthritis 12/20/2010   Back pain 12/15/2010   H/O dissecting abdominal aortic aneurysm repair 08/24/2010   IRRITABLE BOWEL SYNDROME 09/09/2009   Obstructive sleep apnea 08/04/2009   External hemorrhoid 06/26/2009   Osteoporosis 01/09/2009   Hyperlipidemia 07/26/2007   Depression with anxiety 07/26/2007   Essential hypertension 07/26/2007   Allergic rhinitis 07/26/2007   Asthma 07/26/2007   GERD 07/26/2007   Osteoarthritis 07/26/2007   Ocular migraine 07/26/2007   Mild intermittent asthma without complication 58/44/1712   Janna Arch, PT, DPT  08/04/2021, 10:54 AM  [place] [facility] SERVICES [address] [place], [place], [address] Phone: [phone]   Fax:  [phone]  Name: CHANY WOOLWORTH MRN: [id number] Date of Birth: November 21, 1956

## 2021-08-05 ENCOUNTER — Ambulatory Visit: Payer: Medicare HMO

## 2021-08-06 ENCOUNTER — Ambulatory Visit
Admission: RE | Admit: 2021-08-06 | Discharge: 2021-08-06 | Disposition: A | Discharge status: Home / Self Care | Payer: Medicare HMO | Source: Ambulatory Visit | Attending: Family Medicine | Admitting: Family Medicine

## 2021-08-06 ENCOUNTER — Other Ambulatory Visit: Payer: Self-pay

## 2021-08-06 DIAGNOSIS — Z1231 Encounter for screening mammogram for malignant neoplasm of breast: Secondary | ICD-10-CM | POA: Diagnosis not present

## 2021-08-06 DIAGNOSIS — E2839 Other primary ovarian failure: Secondary | ICD-10-CM | POA: Diagnosis not present

## 2021-08-06 DIAGNOSIS — Z780 Asymptomatic menopausal state: Secondary | ICD-10-CM | POA: Diagnosis not present

## 2021-08-06 DIAGNOSIS — M85851 Other specified disorders of bone density and structure, right thigh: Secondary | ICD-10-CM | POA: Diagnosis not present

## 2021-08-06 DIAGNOSIS — M810 Age-related osteoporosis without current pathological fracture: Secondary | ICD-10-CM | POA: Diagnosis not present

## 2021-08-06 DIAGNOSIS — I7101 Dissection of thoracic aorta: Secondary | ICD-10-CM

## 2021-08-06 DIAGNOSIS — I71019 Dissection of thoracic aorta, unspecified: Secondary | ICD-10-CM

## 2021-08-06 NOTE — Telephone Encounter (Signed)
Call Patient no answer left voice message to call the office

## 2021-08-06 NOTE — Telephone Encounter (Signed)
See pt's mychart response. Pt just needs a f/u to do a pap smear with Dr. Glori Bickers, please schedule

## 2021-08-10 ENCOUNTER — Ambulatory Visit: Payer: Medicare HMO

## 2021-08-10 ENCOUNTER — Other Ambulatory Visit: Payer: Self-pay

## 2021-08-10 DIAGNOSIS — R293 Abnormal posture: Secondary | ICD-10-CM | POA: Diagnosis not present

## 2021-08-10 DIAGNOSIS — M6281 Muscle weakness (generalized): Secondary | ICD-10-CM | POA: Diagnosis not present

## 2021-08-10 DIAGNOSIS — G8929 Other chronic pain: Secondary | ICD-10-CM | POA: Diagnosis not present

## 2021-08-10 DIAGNOSIS — M542 Cervicalgia: Secondary | ICD-10-CM

## 2021-08-10 DIAGNOSIS — M25511 Pain in right shoulder: Secondary | ICD-10-CM | POA: Diagnosis not present

## 2021-08-10 NOTE — Therapy (Signed)
[place] MAIN [facility] SERVICES [address] [place], [place], [address] Phone: [phone]   Fax:  [phone]  Physical Therapy Treatment  Patient Details  Name: Rachel Torres MRN: [id number] Date of Birth: 1956-08-06 Referring Provider (PT): Gregor Hams, MD   Encounter Date: 08/10/2021   PT End of Session - 08/10/21 1531     Visit Number 31    Number of Visits 82    Date for PT Re-Evaluation 10/21/21    Authorization Type Eval performed 0/02/5008 (recert for 02/25/1828); new recert for 9/37/1696    Authorization Time Period 1/10 PN 8/15    PT Start Time 1430    PT Stop Time 1515    PT Time Calculation (min) 45 min    Activity Tolerance Patient tolerated treatment well    Behavior During Therapy [facility] for tasks assessed/performed             Past Medical History:  Diagnosis Date   AAA (abdominal aortic aneurysm) ([place])    a. Chronic w/o evidence of aneurysmal dil on CTA 01/2016.   Alcohol abuse    Allergy    Anemia    Anxiety    CAD (coronary artery disease)    a. 08/2010 s/p CABG x 1 (VG->RCA) @ time of Ao dissection repair; b. 01/2016 Lexiscan MV: mid antsept/apical defect w/ ? peri-infarct ischemia-->likely attenuation-->Med Rx.   Chewing difficulty    Chronic combined systolic (congestive) and diastolic (congestive) heart failure ([place])    a. 2012 EF 30-35%; b. 04/2015 EF 40-45%; c. 01/2016 Echo: Ef 55-65%, nl AoV bioprosthesis, nl RV, nl PASP.   Constipation    Depression    Edema of both lower extremities    Gallbladder sludge    GERD (gastroesophageal reflux disease)    Heart failure (HCC)    Heart valve problem    History of Bicuspid Aortic Valve    a. 08/2010 s/p AVR @ time of Ao dissection repair; b. 01/2016 Echo: Ef 55-65%, nl AoV bioprosthesis, nl RV, nl PASP.   Hx of repair of dissecting thoracic aortic aneurysm, [place] type A    a. 08/2010 s/p repair with AVR and VG->RCA; b. 01/2016 CTA: stable appearance of Asc  Thoracic Aortic graft. Opacification of flase lumen of chronic abd Ao dissection w/ retrograde flow through lumbar arteries. No aneurysmal dil of flase lumen.   Hyperlipidemia    Hypertensive heart disease    IBS (irritable bowel syndrome)    Internal hemorrhoids    Kidney problem    Marfan syndrome    pt denies   Migraine    Obstructive sleep apnea    Osteoarthritis    Osteoporosis    Palpitations    Pneumonia    RA (rheumatoid arthritis) ([place])    a. Followed by Dr. [name]    Past Surgical History:  Procedure Laterality Date   ABDOMINAL AORTIC ANEURYSM REPAIR     CARDIAC CATHETERIZATION  2001   [place]     ESOPHAGOGASTRODUODENOSCOPY (EGD) WITH PROPOFOL N/A 05/08/2020   Procedure: ESOPHAGOGASTRODUODENOSCOPY (EGD) WITH PROPOFOL;  Surgeon: Virgel Manifold, MD;  Location: [facility] ENDOSCOPY;  Service: Endoscopy;  Laterality: N/A;   FRACTURE SURGERY Right    shoulder replacement   HEMORRHOID BANDING     ORIF DISTAL RADIUS FRACTURE Left    UPPER GASTROINTESTINAL ENDOSCOPY      There were no vitals filed for this visit.  Subjective Assessment - 08/10/21 1528     Subjective Patient reports she has reached a plateau with her shoulder. Has been compliant with HEP. Had a very very challenging weekend with caregiving duty.    Pertinent History Pt is a 65 y/o female presenting to therapy due to R shoulder pain. Pt had R shoulder replacement 12-13 years ago.  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 She says shoulder pain started this past January as she was helping lift her husband and his assistive devices. She reports she has since stopped lifting him and his walker as they have caregivers who come to the home for 16 hours/week and can help. Pt is most concerned that her shoulder hurts when she drives and washes her hair. Her shoulder does not hurt constantly, but she states she has  pain "the more I use it." She feels a catch and stabbing sensation in R anterior shoulder that can travel into bicep and that sometimes the pain is a burning sensation. She sometimes has pain in L shoulder.  She rates her worst R shoulder pain as 10/10, best pain is 0/10. Her R shoulder pain is currently 1.5/10. She reports that rest, ice and naproxen help with pain relief. She denies redness/swelling/temp difference and N/T in UEs.  Patient has been seen by other therapists in this clinic previously for cervicalgia and back related injuries/pain. Pt PMH includes COVID19, upper GI stomach issues, aorta type A dissection (2011) with aortic valve replacement, bypass of R coronary artery, HTN, hyperlipidemia, pre-diabetes, AFIB, CHF, depression, anxiety, OA, GERD, atypical migraine, asthma, and allergic rhinitis. Additional diagnosis of TMJ has been added with patient reporting pain has been occuring for ~40 years, worsens with stress and grinding of teeth.    Limitations Lifting;House hold activities;Other (comment)    How long can you sit comfortably? Not affected    How long can you stand comfortably? Not affected    How long can you walk comfortably? Pt reports she has pain only if she is carrying something    Diagnostic tests DG R shoulder 02/09/2021 per chart "Impression: Possible hardware loosening post total shoulder arthroplasty. No  evidence of acute fracture or dislocation"    Patient Stated Goals Be able to drive without pain    Currently in Pain? Yes    Pain Score 2     Pain Location Jaw    Pain Orientation Right;Left    Pain Descriptors / Indicators Aching    Pain Type Chronic pain    Pain Onset More than a month ago    Pain Frequency Intermittent                   Manual:  TMJ:  -gentle anterior and inferior mobilization of bilateral jaw 8x 30 seconds each side  -supine cervical mobilizations with PA mobilizations grade II x 6 minutes  -suboccipital release 3x30 second  holds -cervical side bend with overpressure 2x30 second holds -cervical rotation with overpressure 2x 30 second holds  -J mobilization 30 seconds x 2 trials -scapular retraction and depression R side x 30 seconds x 2 trials  TherEx: Towel SNAG: -extension 10x with 3 second hold -side bend 10x with 3 second hold each side    Trigger Point Dry Needling (TDN), unbilled Education performed with patient regarding potential benefit of TDN. Reviewed precautions and risks with patient. Reviewed special precautions/risks over lung fields which include pneumothorax. Reviewed signs and symptoms of  pneumothorax and advised pt to go to ER immediately if these symptoms develop advise them of dry needling treatment. Extensive time spent with pt to ensure full understanding of TDN risks. Pt provided verbal consent to treatment. TDN performed to  with 0.20 x .30 single needle placements with local twitch response (LTR). Pistoning technique utilized. Improved pain-free motion following intervention. Musculature addressed is as followed: masseter bilateral, pterygoid bilateral, temporal region bilateral, R upper trap. X 14 minutes        Pt educated throughout session about proper posture and technique with exercises. Improved exercise technique, movement at target joints, use of target muscles after min to mod verbal, visual, tactile cues    Patient presents to physical therapy session with increased tension from social situation. She is highly motivated for progression of TMJ interventions. Education on potential discharge for shoulder as she has platuead and focus solely on TMJ. Pt will benefit from skilled PT services to address deficits and return to pain-free function at home and with caregiving.                  PT Education - 08/10/21 1528     Education Details exercise technique, body mechanics    Person(s) Educated Patient    Methods Explanation;Demonstration;Tactile cues;Verbal cues     Comprehension Need further instruction;Verbalized understanding;Returned demonstration;Verbal cues required;Tactile cues required              PT Short Term Goals - 07/29/21 1445       PT SHORT TERM GOAL #1   Title Patient will be independent in home exercise program to improve strength/mobility for better functional independence with ADLs.    Baseline 02/24/2021 Pt issued HEP; 5/19: HEP to be advanced, pt has been unable to perform HEP consistently d/t spouse surgery and son's wedding.; 7/7: pt reports she has not been performing HEP "as often as I'm supposed to." 8/10: HEP compliant    Time 6    Period Weeks    Status Achieved    Target Date 08/06/21               PT Long Term Goals - 07/29/21 1446       PT LONG TERM GOAL #1   Title Patient will report a worst pain of 3/10  in last 7 days in her R shoulder to improve tolerance with ADLs and reduced symptoms with activities.    Baseline 02/24/2021: Pt reports worst pain as 10/10.; 5/18: 4/10 is worst pain; 7/7: pt reports worst pain 5/10 8/10: 7/10 pain    Time 12    Period Weeks    Status Partially Met    Target Date 10/21/21      PT LONG TERM GOAL #2   Title Patient will decrease Quick DASH scores by > 8 points demonstrating reduced self-reported upper extremity disability.    Baseline 02/24/2021: QuickDash Disability/symptom score 40, Work score 37.5; 5/18: Disability/symtpom 38.6, Work 43.75; 7/7: disability/sx score 50%, work 62.5% 8/10: 37.7% caregiver work 37.5%    Time 12    Period Weeks    Status Partially Met    Target Date 10/21/21      PT LONG TERM GOAL #3   Title Patient will increase FOTO score to equal to or greater than 60 to demonstrate statistically significant improvement in mobility and quality of life.    Baseline 02/24/2021: 55; 5/18: 60; 7/7: 56 8/10: 56%    Time 12    Period Weeks  Status Achieved      PT LONG TERM GOAL #4   Title Patient will increase BUE MMT scores by at least 1/2 point as  to improve functional strength and endurance for overhead activities and ADLs such as driving and washing her hair.    Baseline 02/24/2021: L/R shoulder flexion 4/5, 3+/5; shoulder abduction 4/5 B; shoulder extension 4+5 B; shoulder IR/ER both grossly 4/5 B; elbow flexion/extension 5/5 for both B; wrist flexion/extension 5/5 for both B; 5/18: L shoulder is grossly 4+/5.  R shoulder flexion 3+/5; R shoulder abduction 4-/5; R shoulder IR/ER both 4-/5 (B); R elbow flexion 4/5, elbow ext. 5/5 B, shoulder ext. 5/5 B; 7/7:  L shoulder grossly 4+/5, R shoulder 4/5 except shoulder flexion 4-/5, R shoulder IR/ER pain limited to 3/5 8/10: grossly 4/5 with IR/ER painful limiting full range    Time 12    Period Weeks    Status Partially Met    Target Date 10/21/21      PT LONG TERM GOAL #5   Title Pt will demonstrate decrease in NDI by at least 19% in order to demonstrate clinically significant reduction in disability related to neck/TMJ injury/pain    Baseline 8/3: 22%    Time 12    Period Weeks    Status New    Target Date 10/21/21      PT LONG TERM GOAL #6   Title Patient will report decreased worst VAS of jaw to <3/10 for decreased pain with eating and improved quality of life    Baseline 8/3: 6/10    Time 12    Period Weeks    Status New    Target Date 10/21/21      PT LONG TERM GOAL #7   Title Patient will report little to no clicking/catching with chewing for improved quality of life and ADL performance    Baseline 8/3: audible clicking/catching    Time 12    Period Weeks    Status New    Target Date 10/21/21                   Plan - 08/10/21 1531     Clinical Impression Statement Patient presents to physical therapy session with increased tension from social situation. She is highly motivated for progression of TMJ interventions. Education on potential discharge for shoulder as she has platuead and focus solely on TMJ. Pt will benefit from skilled PT services to address deficits  and return to pain-free function at home and with caregiving.    Personal Factors and Comorbidities Comorbidity 1;Comorbidity 2;Comorbidity 3+;Past/Current Experience;Social Background;Time since onset of injury/illness/exacerbation;Transportation;Fitness;Other    Comorbidities Pertinent: recent hx of COVID19, HTN, hyperlipidemia, pre-diabetes, AFIB, CHF, depression, anxiety, OA, atypical migraine, asthma.    Examination-Activity Limitations Bathing;Bed Mobility;Hygiene/Grooming;Lift;Caring for Others;Reach Overhead;Carry;Dressing;Sleep;Other;Self Feeding    Examination-Participation Restrictions Laundry;Cleaning;Driving;Community Activity;Other;Yard Work;Shop;Meal Prep;Volunteer    Stability/Clinical Decision Making Stable/Uncomplicated    Rehab Potential Good    PT Frequency 2x / week    PT Duration 12 weeks    PT Treatment/Interventions ADLs/Self Care Home Management;Biofeedback;Cryotherapy;Electrical Stimulation;Moist Heat;Traction;Ultrasound;DME Instruction;Functional mobility training;Therapeutic activities;Therapeutic exercise;Neuromuscular re-education;Patient/family education;Orthotic Fit/Training;Manual techniques;Scar mobilization;Passive range of motion;Dry needling;Taping;Energy conservation;Joint Manipulations;Spinal Manipulations;Gait training;Visual/perceptual remediation/compensation    PT Next Visit Plan recert shoulder    PT Home Exercise Plan Issued HEP with handout: RTB rows, RTB shoulder ER pull-aparts, and upper trap stretch; shoulder perturbation exercises, ROM for shoulder IR within pain-free range, mobilizations/distraction to R shoulder, continue training correct lifting mechanics in order to  reduce strain on RUE, lifting mechanics    Consulted and Agree with Plan of Care Patient             Patient will benefit from skilled therapeutic intervention in order to improve the following deficits and impairments:  Increased fascial restricitons, Improper body mechanics,  Pain, Decreased mobility, Increased muscle spasms, Postural dysfunction, Decreased activity tolerance, Decreased endurance, Decreased range of motion, Decreased strength, Hypomobility, Impaired UE functional use, Impaired flexibility, Decreased coordination, Impaired tone  Visit Diagnosis: Cervicalgia  Muscle weakness (generalized)  Abnormal posture     Problem List Patient Active Problem List   Diagnosis Date Noted   Medicare annual wellness visit, initial 07/17/2021   TMJ (dislocation of temporomandibular joint) 07/17/2021   Encounter for screening mammogram for breast cancer 06/05/2021   Diarrhea 04/11/2020   Epigastric pain 02/19/2020   Dark stools 02/19/2020   PAF (paroxysmal atrial fibrillation) (HCC) 02/05/2019   Shortness of breath 01/27/2019   Irregular surface of cornea 12/25/2018   HPV (human papilloma virus) infection 12/01/2016   Screening mammogram, encounter for 11/29/2016   Estrogen deficiency 11/29/2016   Encounter for routine gynecological examination 11/29/2016   Grade III hemorrhoids 10/06/2016   Tachycardia 08/14/2016   Hemorrhoids 08/13/2016   Atypical migraine 08/03/2016   CAD (coronary artery disease)    AAA (abdominal aortic aneurysm) (HCC)    Chronic combined systolic (congestive) and diastolic (congestive) heart failure ([place])    Prediabetes 02/11/2016   Generalized anxiety disorder 12/08/2015   Panic attacks 07/28/2015   Sensorineural hearing loss 03/27/2013   Vitamin D deficiency 07/03/2012   H/O aortic dissection 05/12/2012   Routine general medical examination at a health care facility 09/20/2011   S/P AVR (aortic valve replacement) 01/07/2011   CORONARY ARTERY BYPASS GRAFT, HX OF 01/07/2011   Arthritis 12/20/2010   Back pain 12/15/2010   H/O dissecting abdominal aortic aneurysm repair 08/24/2010   IRRITABLE BOWEL SYNDROME 09/09/2009   Obstructive sleep apnea 08/04/2009   External hemorrhoid 06/26/2009   Osteoporosis 01/09/2009    Hyperlipidemia 07/26/2007   Depression with anxiety 07/26/2007   Essential hypertension 07/26/2007   Allergic rhinitis 07/26/2007   Asthma 07/26/2007   GERD 07/26/2007   Osteoarthritis 07/26/2007   Ocular migraine 07/26/2007   Mild intermittent asthma without complication 19/37/9024   Janna Arch, PT, DPT  08/10/2021, 3:34 PM   [facility] SERVICES [address] [place], [place], [address] Phone: [phone]   Fax:  [phone]  Name: IZZY DOUBEK MRN: [id number] Date of Birth: 02-18-1956

## 2021-08-10 NOTE — Telephone Encounter (Signed)
Called and schedule pt

## 2021-08-12 ENCOUNTER — Ambulatory Visit: Payer: Medicare HMO

## 2021-08-12 ENCOUNTER — Other Ambulatory Visit: Payer: Self-pay

## 2021-08-12 DIAGNOSIS — M6281 Muscle weakness (generalized): Secondary | ICD-10-CM

## 2021-08-12 DIAGNOSIS — R293 Abnormal posture: Secondary | ICD-10-CM | POA: Diagnosis not present

## 2021-08-12 DIAGNOSIS — M542 Cervicalgia: Secondary | ICD-10-CM

## 2021-08-12 DIAGNOSIS — G8929 Other chronic pain: Secondary | ICD-10-CM | POA: Diagnosis not present

## 2021-08-12 DIAGNOSIS — M25511 Pain in right shoulder: Secondary | ICD-10-CM | POA: Diagnosis not present

## 2021-08-12 NOTE — Therapy (Signed)
[place] MAIN [facility] SERVICES [address] [place], [place], [address] Phone: [phone]   Fax:  [phone]  Physical Therapy Treatment  Patient Details  Name: Rachel Torres MRN: [id number] Date of Birth: 10-02-1956 Referring Provider (PT): Gregor Hams, MD   Encounter Date: 08/12/2021   PT End of Session - 08/12/21 1425     Visit Number 32    Number of Visits 51    Date for PT Re-Evaluation 10/21/21    Authorization Type Eval performed 03/20/6605 (recert for 3/0/1601); new recert for 0/93/2355    Authorization Time Period 2/10 PN 8/15    PT Start Time 1429    PT Stop Time 1514    PT Time Calculation (min) 45 min    Activity Tolerance Patient tolerated treatment well    Behavior During Therapy [facility] for tasks assessed/performed             Past Medical History:  Diagnosis Date   AAA (abdominal aortic aneurysm) ([place])    a. Chronic w/o evidence of aneurysmal dil on CTA 01/2016.   Alcohol abuse    Allergy    Anemia    Anxiety    CAD (coronary artery disease)    a. 08/2010 s/p CABG x 1 (VG->RCA) @ time of Ao dissection repair; b. 01/2016 Lexiscan MV: mid antsept/apical defect w/ ? peri-infarct ischemia-->likely attenuation-->Med Rx.   Chewing difficulty    Chronic combined systolic (congestive) and diastolic (congestive) heart failure ([place])    a. 2012 EF 30-35%; b. 04/2015 EF 40-45%; c. 01/2016 Echo: Ef 55-65%, nl AoV bioprosthesis, nl RV, nl PASP.   Constipation    Depression    Edema of both lower extremities    Gallbladder sludge    GERD (gastroesophageal reflux disease)    Heart failure (HCC)    Heart valve problem    History of Bicuspid Aortic Valve    a. 08/2010 s/p AVR @ time of Ao dissection repair; b. 01/2016 Echo: Ef 55-65%, nl AoV bioprosthesis, nl RV, nl PASP.   Hx of repair of dissecting thoracic aortic aneurysm, Stanford type A    a. 08/2010 s/p repair with AVR and VG->RCA; b. 01/2016 CTA: stable appearance of Asc  Thoracic Aortic graft. Opacification of flase lumen of chronic abd Ao dissection w/ retrograde flow through lumbar arteries. No aneurysmal dil of flase lumen.   Hyperlipidemia    Hypertensive heart disease    IBS (irritable bowel syndrome)    Internal hemorrhoids    Kidney problem    Marfan syndrome    pt denies   Migraine    Obstructive sleep apnea    Osteoarthritis    Osteoporosis    Palpitations    Pneumonia    RA (rheumatoid arthritis) ([place])    a. Followed by Dr. [name]    Past Surgical History:  Procedure Laterality Date   ABDOMINAL AORTIC ANEURYSM REPAIR     CARDIAC CATHETERIZATION  2001   [place]     ESOPHAGOGASTRODUODENOSCOPY (EGD) WITH PROPOFOL N/A 05/08/2020   Procedure: ESOPHAGOGASTRODUODENOSCOPY (EGD) WITH PROPOFOL;  Surgeon: Virgel Manifold, MD;  Location: [facility] ENDOSCOPY;  Service: Endoscopy;  Laterality: N/A;   FRACTURE SURGERY Right    shoulder replacement   HEMORRHOID BANDING     ORIF DISTAL RADIUS FRACTURE Left    UPPER GASTROINTESTINAL ENDOSCOPY      There were no vitals filed for this visit.  Subjective Assessment - 08/13/21 0711     Subjective Patient reports a previous night she has been having excessive spasms of jaw which woke her from her sleep.  She will ended her exercises which relieved the pain and was able to fall back asleep    Pertinent History Pt is a 65 y/o female presenting to therapy due to R shoulder pain. Pt had R shoulder replacement 12-13 years ago.  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 She says shoulder pain started this past January as she was helping lift her husband and his assistive devices. She reports she has since stopped lifting him and his walker as they have caregivers who come to the home for 16 hours/week and can help. Pt is most concerned that her shoulder hurts when she drives and washes her hair. Her shoulder does  not hurt constantly, but she states she has pain "the more I use it." She feels a catch and stabbing sensation in R anterior shoulder that can travel into bicep and that sometimes the pain is a burning sensation. She sometimes has pain in L shoulder.  She rates her worst R shoulder pain as 10/10, best pain is 0/10. Her R shoulder pain is currently 1.5/10. She reports that rest, ice and naproxen help with pain relief. She denies redness/swelling/temp difference and N/T in UEs.  Patient has been seen by other therapists in this clinic previously for cervicalgia and back related injuries/pain. Pt PMH includes COVID19, upper GI stomach issues, aorta type A dissection (2011) with aortic valve replacement, bypass of R coronary artery, HTN, hyperlipidemia, pre-diabetes, AFIB, CHF, depression, anxiety, OA, GERD, atypical migraine, asthma, and allergic rhinitis. Additional diagnosis of TMJ has been added with patient reporting pain has been occuring for ~40 years, worsens with stress and grinding of teeth.    Limitations Lifting;House hold activities;Other (comment)    How long can you sit comfortably? Not affected    How long can you stand comfortably? Not affected    How long can you walk comfortably? Pt reports she has pain only if she is carrying something    Diagnostic tests DG R shoulder 02/09/2021 per chart "Impression: Possible hardware loosening post total shoulder arthroplasty. No  evidence of acute fracture or dislocation"    Patient Stated Goals Be able to drive without pain    Currently in Pain? Yes    Pain Score 2     Pain Location Jaw    Pain Orientation Right;Left    Pain Descriptors / Indicators Aching    Pain Type Chronic pain    Pain Onset More than a month ago             Manual:  TMJ:  -gentle anterior and inferior mobilization of bilateral jaw 8x 30 seconds each side  -supine cervical mobilizations with PA mobilizations grade II x 6 minutes  -suboccipital release 3x30 second  holds -cervical side bend with overpressure 2x30 second holds -cervical rotation with overpressure 2x 30 second holds  -J mobilization 30 seconds x 2 trials -scapular retraction and depression R side x 30 seconds x 2 trials   TherEx: Cervical extension and flexion 10x with focus on full range and jaw retraction and protraction Chin tucks; 10 x 3 second holds Upper trap stretch 60 seconds each side.   Supine jaw protrustion 10x with cue for arc of motion within pain free range Supine jaw abduction 12x within pain free range.  Trigger Point Dry Needling (TDN), unbilled Education performed with patient regarding potential benefit of TDN. Reviewed precautions and risks with patient. Reviewed special precautions/risks over lung fields which include pneumothorax. Reviewed signs and symptoms of pneumothorax and advised pt to go to ER immediately if these symptoms develop advise them of dry needling treatment. Extensive time spent with pt to ensure full understanding of TDN risks. Pt provided verbal consent to treatment. TDN performed to  with 0.20 x .30 single needle placements with local twitch response (LTR). Pistoning technique utilized. Improved pain-free motion following intervention. Musculature addressed is as followed: masseter bilateral, pterygoid bilateral, temporal region bilateral, R upper trap. X 12 minutes        Pt educated throughout session about proper posture and technique with exercises. Improved exercise technique, movement at target joints, use of target muscles after min to mod verbal, visual, tactile cues      Patient tolerated today's session well with no reports of increased pain.  Increased tolerance to exercise as well as manual allowed for progression.  Decreased episodes of clicking and popping in job noted.  Increased tension of right temporal and temporal mandibular region produced by end of session.  Will discharge shoulder next session and focus solely on TMJ. Pt will  benefit from skilled PT services to address deficits and return to pain-free function at home and with caregiving                      PT Education - 08/12/21 1425     Education Details TMJ treatment, body mechanics, manual    Person(s) Educated Patient    Methods Explanation;Demonstration;Tactile cues;Verbal cues    Comprehension Verbalized understanding;Returned demonstration;Verbal cues required;Tactile cues required              PT Short Term Goals - 07/29/21 1445       PT SHORT TERM GOAL #1   Title Patient will be independent in home exercise program to improve strength/mobility for better functional independence with ADLs.    Baseline 02/24/2021 Pt issued HEP; 5/19: HEP to be advanced, pt has been unable to perform HEP consistently d/t spouse surgery and son's wedding.; 7/7: pt reports she has not been performing HEP "as often as I'm supposed to." 8/10: HEP compliant    Time 6    Period Weeks    Status Achieved    Target Date 08/06/21               PT Long Term Goals - 07/29/21 1446       PT LONG TERM GOAL #1   Title Patient will report a worst pain of 3/10  in last 7 days in her R shoulder to improve tolerance with ADLs and reduced symptoms with activities.    Baseline 02/24/2021: Pt reports worst pain as 10/10.; 5/18: 4/10 is worst pain; 7/7: pt reports worst pain 5/10 8/10: 7/10 pain    Time 12    Period Weeks    Status Partially Met    Target Date 10/21/21      PT LONG TERM GOAL #2   Title Patient will decrease Quick DASH scores by > 8 points demonstrating reduced self-reported upper extremity disability.    Baseline 02/24/2021: QuickDash Disability/symptom score 40, Work score 37.5; 5/18: Disability/symtpom 38.6, Work 43.75; 7/7: disability/sx score 50%, work 62.5% 8/10: 37.7% caregiver work 37.5%    Time 12    Period Weeks    Status Partially Met    Target  Date 10/21/21      PT LONG TERM GOAL #3   Title Patient will increase FOTO score to  equal to or greater than 60 to demonstrate statistically significant improvement in mobility and quality of life.    Baseline 02/24/2021: 55; 5/18: 60; 7/7: 56 8/10: 56%    Time 12    Period Weeks    Status Achieved      PT LONG TERM GOAL #4   Title Patient will increase BUE MMT scores by at least 1/2 point as to improve functional strength and endurance for overhead activities and ADLs such as driving and washing her hair.    Baseline 02/24/2021: L/R shoulder flexion 4/5, 3+/5; shoulder abduction 4/5 B; shoulder extension 4+5 B; shoulder IR/ER both grossly 4/5 B; elbow flexion/extension 5/5 for both B; wrist flexion/extension 5/5 for both B; 5/18: L shoulder is grossly 4+/5.  R shoulder flexion 3+/5; R shoulder abduction 4-/5; R shoulder IR/ER both 4-/5 (B); R elbow flexion 4/5, elbow ext. 5/5 B, shoulder ext. 5/5 B; 7/7:  L shoulder grossly 4+/5, R shoulder 4/5 except shoulder flexion 4-/5, R shoulder IR/ER pain limited to 3/5 8/10: grossly 4/5 with IR/ER painful limiting full range    Time 12    Period Weeks    Status Partially Met    Target Date 10/21/21      PT LONG TERM GOAL #5   Title Pt will demonstrate decrease in NDI by at least 19% in order to demonstrate clinically significant reduction in disability related to neck/TMJ injury/pain    Baseline 8/3: 22%    Time 12    Period Weeks    Status New    Target Date 10/21/21      PT LONG TERM GOAL #6   Title Patient will report decreased worst VAS of jaw to <3/10 for decreased pain with eating and improved quality of life    Baseline 8/3: 6/10    Time 12    Period Weeks    Status New    Target Date 10/21/21      PT LONG TERM GOAL #7   Title Patient will report little to no clicking/catching with chewing for improved quality of life and ADL performance    Baseline 8/3: audible clicking/catching    Time 12    Period Weeks    Status New    Target Date 10/21/21                   Plan - 08/13/21 0716     Clinical  Impression Statement Patient tolerated today's session well with no reports of increased pain.  Increased tolerance to exercise as well as manual allowed for progression.  Decreased episodes of clicking and popping in job noted.  Increased tension of right temporal and temporal mandibular region produced by end of session.  Will discharge shoulder next session and focus solely on TMJ.? Pt will benefit from skilled PT services to address deficits and return to pain-free function at home and with caregiving    Personal Factors and Comorbidities Comorbidity 1;Comorbidity 2;Comorbidity 3+;Past/Current Experience;Social Background;Time since onset of injury/illness/exacerbation;Transportation;Fitness;Other    Comorbidities Pertinent: recent hx of COVID19, HTN, hyperlipidemia, pre-diabetes, AFIB, CHF, depression, anxiety, OA, atypical migraine, asthma.    Examination-Activity Limitations Bathing;Bed Mobility;Hygiene/Grooming;Lift;Caring for Others;Reach Overhead;Carry;Dressing;Sleep;Other;Self Feeding    Examination-Participation Restrictions Laundry;Cleaning;Driving;Community Activity;Other;Yard Work;Shop;Meal Prep;Volunteer    Stability/Clinical Decision Making Stable/Uncomplicated    Rehab Potential Good    PT Frequency 2x / week  PT Duration 12 weeks    PT Treatment/Interventions ADLs/Self Care Home Management;Biofeedback;Cryotherapy;Electrical Stimulation;Moist Heat;Traction;Ultrasound;DME Instruction;Functional mobility training;Therapeutic activities;Therapeutic exercise;Neuromuscular re-education;Patient/family education;Orthotic Fit/Training;Manual techniques;Scar mobilization;Passive range of motion;Dry needling;Taping;Energy conservation;Joint Manipulations;Spinal Manipulations;Gait training;Visual/perceptual remediation/compensation    PT Next Visit Plan recert shoulder    PT Home Exercise Plan Issued HEP with handout: RTB rows, RTB shoulder ER pull-aparts, and upper trap stretch; shoulder  perturbation exercises, ROM for shoulder IR within pain-free range, mobilizations/distraction to R shoulder, continue training correct lifting mechanics in order to reduce strain on RUE, lifting mechanics    Consulted and Agree with Plan of Care Patient             Patient will benefit from skilled therapeutic intervention in order to improve the following deficits and impairments:  Increased fascial restricitons, Improper body mechanics, Pain, Decreased mobility, Increased muscle spasms, Postural dysfunction, Decreased activity tolerance, Decreased endurance, Decreased range of motion, Decreased strength, Hypomobility, Impaired UE functional use, Impaired flexibility, Decreased coordination, Impaired tone  Visit Diagnosis: Cervicalgia  Muscle weakness (generalized)  Abnormal posture     Problem List Patient Active Problem List   Diagnosis Date Noted   Medicare annual wellness visit, initial 07/17/2021   TMJ (dislocation of temporomandibular joint) 07/17/2021   Encounter for screening mammogram for breast cancer 06/05/2021   Diarrhea 04/11/2020   Epigastric pain 02/19/2020   Dark stools 02/19/2020   PAF (paroxysmal atrial fibrillation) ([place]) 02/05/2019   Shortness of breath 01/27/2019   Irregular surface of cornea 12/25/2018   HPV (human papilloma virus) infection 12/01/2016   Screening mammogram, encounter for 11/29/2016   Estrogen deficiency 11/29/2016   Encounter for routine gynecological examination 11/29/2016   Grade III hemorrhoids 10/06/2016   Tachycardia 08/14/2016   Hemorrhoids 08/13/2016   Atypical migraine 08/03/2016   CAD (coronary artery disease)    AAA (abdominal aortic aneurysm) (HCC)    Chronic combined systolic (congestive) and diastolic (congestive) heart failure ([place])    Prediabetes 02/11/2016   Generalized anxiety disorder 12/08/2015   Panic attacks 07/28/2015   Sensorineural hearing loss 03/27/2013   Vitamin D deficiency 07/03/2012   H/O aortic  dissection 05/12/2012   Routine general medical examination at a health care facility 09/20/2011   S/P AVR (aortic valve replacement) 01/07/2011   CORONARY ARTERY BYPASS GRAFT, HX OF 01/07/2011   Arthritis 12/20/2010   Back pain 12/15/2010   H/O dissecting abdominal aortic aneurysm repair 08/24/2010   IRRITABLE BOWEL SYNDROME 09/09/2009   Obstructive sleep apnea 08/04/2009   External hemorrhoid 06/26/2009   Osteoporosis 01/09/2009   Hyperlipidemia 07/26/2007   Depression with anxiety 07/26/2007   Essential hypertension 07/26/2007   Allergic rhinitis 07/26/2007   Asthma 07/26/2007   GERD 07/26/2007   Osteoarthritis 07/26/2007   Ocular migraine 07/26/2007   Mild intermittent asthma without complication 19/37/9024   Janna Arch, PT, DPT  08/13/2021, 7:17 AM   [facility] MAIN [facility] SERVICES [address] [place], [place], [address] Phone: [phone]   Fax:  [phone]  Name: Rachel Torres MRN: [id number] Date of Birth: 11-Sep-1956

## 2021-08-13 ENCOUNTER — Ambulatory Visit
Admission: RE | Admit: 2021-08-13 | Discharge: 2021-08-13 | Disposition: A | Discharge status: Home / Self Care | Payer: Medicare HMO | Source: Ambulatory Visit | Attending: Vascular Surgery | Admitting: Vascular Surgery

## 2021-08-13 ENCOUNTER — Other Ambulatory Visit: Payer: Self-pay | Admitting: Vascular Surgery

## 2021-08-13 DIAGNOSIS — I7772 Dissection of iliac artery: Secondary | ICD-10-CM | POA: Diagnosis not present

## 2021-08-13 DIAGNOSIS — I71019 Dissection of thoracic aorta, unspecified: Secondary | ICD-10-CM

## 2021-08-13 DIAGNOSIS — I723 Aneurysm of iliac artery: Secondary | ICD-10-CM | POA: Diagnosis not present

## 2021-08-13 DIAGNOSIS — K449 Diaphragmatic hernia without obstruction or gangrene: Secondary | ICD-10-CM | POA: Diagnosis not present

## 2021-08-13 DIAGNOSIS — I714 Abdominal aortic aneurysm, without rupture: Secondary | ICD-10-CM | POA: Diagnosis not present

## 2021-08-13 DIAGNOSIS — K7689 Other specified diseases of liver: Secondary | ICD-10-CM | POA: Diagnosis not present

## 2021-08-13 DIAGNOSIS — I7101 Dissection of thoracic aorta: Secondary | ICD-10-CM

## 2021-08-13 HISTORY — DX: Unspecified asthma, uncomplicated: J45.909

## 2021-08-13 LAB — POCT I-STAT CREATININE: Creatinine, Ser: 0.8 mg/dL (ref 0.44–1.00)

## 2021-08-13 MED ORDER — IOHEXOL 350 MG/ML SOLN
100.0000 mL | Freq: Once | INTRAVENOUS | Status: AC | PRN
  Administered 2021-08-13: 100 mL via INTRAVENOUS

## 2021-08-15 ENCOUNTER — Other Ambulatory Visit: Payer: Self-pay | Admitting: Cardiovascular Disease

## 2021-08-17 ENCOUNTER — Ambulatory Visit: Payer: Medicare HMO

## 2021-08-17 ENCOUNTER — Other Ambulatory Visit: Payer: Self-pay

## 2021-08-17 DIAGNOSIS — M6281 Muscle weakness (generalized): Secondary | ICD-10-CM | POA: Diagnosis not present

## 2021-08-17 DIAGNOSIS — R293 Abnormal posture: Secondary | ICD-10-CM

## 2021-08-17 DIAGNOSIS — M542 Cervicalgia: Secondary | ICD-10-CM

## 2021-08-17 DIAGNOSIS — G8929 Other chronic pain: Secondary | ICD-10-CM

## 2021-08-17 DIAGNOSIS — M25511 Pain in right shoulder: Secondary | ICD-10-CM | POA: Diagnosis not present

## 2021-08-17 NOTE — Therapy (Signed)
[place] MAIN [facility] SERVICES [address] [name], [place], [address] Phone: [phone]   Fax:  [phone]  Physical Therapy Treatment  Patient Details  Name: Rachel Torres MRN: [id number] Date of Birth: September 26, 1956 Referring Provider (PT): Gregor Hams, MD   Encounter Date: 08/17/2021   PT End of Session - 08/17/21 1519     Visit Number 33    Number of Visits 72    Date for PT Re-Evaluation 10/21/21    Authorization Type Eval performed 01/24/9562 (recert for 07/26/5642); new recert for 03/18/5187    Authorization Time Period 3/10 PN 8/15    PT Start Time 1430    PT Stop Time 1513    PT Time Calculation (min) 43 min    Activity Tolerance Patient tolerated treatment well    Behavior During Therapy [facility] for tasks assessed/performed             Past Medical History:  Diagnosis Date   AAA (abdominal aortic aneurysm) ([place])    a. Chronic w/o evidence of aneurysmal dil on CTA 01/2016.   Alcohol abuse    Allergy    Anemia    Anxiety    Asthma    CAD (coronary artery disease)    a. 08/2010 s/p CABG x 1 (VG->RCA) @ time of Ao dissection repair; b. 01/2016 Lexiscan MV: mid antsept/apical defect w/ ? peri-infarct ischemia-->likely attenuation-->Med Rx.   Chewing difficulty    Chronic combined systolic (congestive) and diastolic (congestive) heart failure ([place])    a. 2012 EF 30-35%; b. 04/2015 EF 40-45%; c. 01/2016 Echo: Ef 55-65%, nl AoV bioprosthesis, nl RV, nl PASP.   Constipation    Depression    Edema of both lower extremities    Gallbladder sludge    GERD (gastroesophageal reflux disease)    Heart failure (HCC)    Heart valve problem    History of Bicuspid Aortic Valve    a. 08/2010 s/p AVR @ time of Ao dissection repair; b. 01/2016 Echo: Ef 55-65%, nl AoV bioprosthesis, nl RV, nl PASP.   Hx of repair of dissecting thoracic aortic aneurysm, [place] type A    a. 08/2010 s/p repair with AVR and VG->RCA; b. 01/2016 CTA: stable appearance  of Asc Thoracic Aortic graft. Opacification of flase lumen of chronic abd Ao dissection w/ retrograde flow through lumbar arteries. No aneurysmal dil of flase lumen.   Hyperlipidemia    Hypertension    Hypertensive heart disease    IBS (irritable bowel syndrome)    Internal hemorrhoids    Kidney problem    Marfan syndrome    pt denies   Migraine    Obstructive sleep apnea    Osteoarthritis    Osteoporosis    Palpitations    Pneumonia    RA (rheumatoid arthritis) ([place])    a. Followed by Dr. [name]    Past Surgical History:  Procedure Laterality Date   ABDOMINAL AORTIC ANEURYSM REPAIR     CARDIAC CATHETERIZATION  2001   [place]     ESOPHAGOGASTRODUODENOSCOPY (EGD) WITH PROPOFOL N/A 05/08/2020   Procedure: ESOPHAGOGASTRODUODENOSCOPY (EGD) WITH PROPOFOL;  Surgeon: Virgel Manifold, MD;  Location: [facility] ENDOSCOPY;  Service: Endoscopy;  Laterality: N/A;   FRACTURE SURGERY Right    shoulder replacement   HEMORRHOID BANDING     ORIF DISTAL RADIUS FRACTURE Left    UPPER GASTROINTESTINAL ENDOSCOPY      There  were no vitals filed for this visit.   Subjective Assessment - 08/17/21 1436     Subjective Patient is aware that today she will be discharged from shoulder due to plateau and need to see physician for further follow up.    Pertinent History Pt is a 65 y/o female presenting to therapy due to R shoulder pain. Pt had R shoulder replacement 12-13 years ago.  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 She says shoulder pain started this past January as she was helping lift her husband and his assistive devices. She reports she has since stopped lifting him and his walker as they have caregivers who come to the home for 16 hours/week and can help. Pt is most concerned that her shoulder hurts when she drives and washes her hair. Her shoulder does not hurt constantly, but she states she  has pain "the more I use it." She feels a catch and stabbing sensation in R anterior shoulder that can travel into bicep and that sometimes the pain is a burning sensation. She sometimes has pain in L shoulder.  She rates her worst R shoulder pain as 10/10, best pain is 0/10. Her R shoulder pain is currently 1.5/10. She reports that rest, ice and naproxen help with pain relief. She denies redness/swelling/temp difference and N/T in UEs.  Patient has been seen by other therapists in this clinic previously for cervicalgia and back related injuries/pain. Pt PMH includes COVID19, upper GI stomach issues, aorta type A dissection (2011) with aortic valve replacement, bypass of R coronary artery, HTN, hyperlipidemia, pre-diabetes, AFIB, CHF, depression, anxiety, OA, GERD, atypical migraine, asthma, and allergic rhinitis. Additional diagnosis of TMJ has been added with patient reporting pain has been occuring for ~40 years, worsens with stress and grinding of teeth.    Limitations Lifting;House hold activities;Other (comment)    How long can you sit comfortably? Not affected    How long can you stand comfortably? Not affected    How long can you walk comfortably? Pt reports she has pain only if she is carrying something    Diagnostic tests DG R shoulder 02/09/2021 per chart "Impression: Possible hardware loosening post total shoulder arthroplasty. No  evidence of acute fracture or dislocation"    Patient Stated Goals Be able to drive without pain    Currently in Pain? Yes    Pain Score 4     Pain Location --   shoulder and jaw   Pain Orientation Right    Pain Descriptors / Indicators Aching    Pain Type Chronic pain    Pain Onset More than a month ago    Pain Frequency Constant                Shoulder goals   R shouler pain 7/10  quickDash 52.3%; caregiving/work 56%  BUE MMT; 4/5 in all ranges FOTO: 52  Treatment:   Manual TMJ:  -gentle anterior and inferior mobilization of bilateral jaw  8x 30 seconds each side  -supine cervical mobilizations with PA mobilizations grade II x 6 minutes  -suboccipital release 3x30 second holds -cervical side bend with overpressure 2x30 second holds -cervical rotation with overpressure 2x 30 second holds  -J mobilization 30 seconds x 2 trials -scapular retraction and depression R side x 30 seconds x 2 trials   TherEx: Cervical extension and flexion 10x with focus on full range and jaw retraction and protraction Chin tucks; 10 x 3  second holds  Trigger Point Dry Needling (TDN), unbilled Education performed with patient regarding potential benefit of TDN. Reviewed precautions and risks with patient. Reviewed special precautions/risks over lung fields which include pneumothorax. Reviewed signs and symptoms of pneumothorax and advised pt to go to ER immediately if these symptoms develop advise them of dry needling treatment. Extensive time spent with pt to ensure full understanding of TDN risks. Pt provided verbal consent to treatment. TDN performed to  with 0.20 x .30 single needle placements with local twitch response (LTR). Pistoning technique utilized. Improved pain-free motion following intervention. Musculature addressed is as followed: masseter bilateral, pterygoid bilateral, temporal region bilateral, R upper trap. X 8 minutes        Pt educated throughout session about proper posture and technique with exercises. Improved exercise technique, movement at target joints, use of target muscles after min to mod verbal, visual, tactile cues       Patient has reached plateau of shoulder and will be discharged from shoulder while keeping TMJ POC . Patient agreeable to reach out to physician due to plateau and continued pain of shoulder. Multiple trigger points found in the right side of the mouth compared to left. Pt will benefit from skilled PT services to address deficits and return to pain-free function at home and with  caregiving         PT Education - 08/17/21 1437     Education Details discharge shoulder, treat TMJ    Person(s) Educated Patient    Methods Explanation;Demonstration;Tactile cues;Verbal cues    Comprehension Verbalized understanding;Returned demonstration;Verbal cues required;Tactile cues required              PT Short Term Goals - 07/29/21 1445       PT SHORT TERM GOAL #1   Title Patient will be independent in home exercise program to improve strength/mobility for better functional independence with ADLs.    Baseline 02/24/2021 Pt issued HEP; 5/19: HEP to be advanced, pt has been unable to perform HEP consistently d/t spouse surgery and son's wedding.; 7/7: pt reports she has not been performing HEP "as often as I'm supposed to." 8/10: HEP compliant    Time 6    Period Weeks    Status Achieved    Target Date 08/06/21               PT Long Term Goals - 08/17/21 1522       PT LONG TERM GOAL #1   Title Patient will report a worst pain of 3/10  in last 7 days in her R shoulder to improve tolerance with ADLs and reduced symptoms with activities.    Baseline 02/24/2021: Pt reports worst pain as 10/10.; 5/18: 4/10 is worst pain; 7/7: pt reports worst pain 5/10 8/10: 7/10 pain 8/29: 7/10    Time 12    Period Weeks    Status Not Met      PT LONG TERM GOAL #2   Title Patient will decrease Quick DASH scores by > 8 points demonstrating reduced self-reported upper extremity disability.    Baseline 02/24/2021: QuickDash Disability/symptom score 40, Work score 37.5; 5/18: Disability/symtpom 38.6, Work 43.75; 7/7: disability/sx score 50%, work 62.5% 8/10: 37.7% caregiver work 37.5% 8/29: 52.3%, caregiving/work 56%    Time 12    Period Weeks    Status Not Met      PT LONG TERM GOAL #3   Title Patient will increase FOTO score to equal to or greater than 60 to demonstrate  statistically significant improvement in mobility and quality of life.    Baseline 02/24/2021: 55; 5/18: 60; 7/7:  56 8/10: 56% 8/29: 52%    Time 12    Period Weeks    Status Not Met      PT LONG TERM GOAL #4   Title Patient will increase BUE MMT scores by at least 1/2 point as to improve functional strength and endurance for overhead activities and ADLs such as driving and washing her hair.    Baseline 02/24/2021: L/R shoulder flexion 4/5, 3+/5; shoulder abduction 4/5 B; shoulder extension 4+5 B; shoulder IR/ER both grossly 4/5 B; elbow flexion/extension 5/5 for both B; wrist flexion/extension 5/5 for both B; 5/18: L shoulder is grossly 4+/5.  R shoulder flexion 3+/5; R shoulder abduction 4-/5; R shoulder IR/ER both 4-/5 (B); R elbow flexion 4/5, elbow ext. 5/5 B, shoulder ext. 5/5 B; 7/7:  L shoulder grossly 4+/5, R shoulder 4/5 except shoulder flexion 4-/5, R shoulder IR/ER pain limited to 3/5 8/10: grossly 4/5 with IR/ER painful limiting full range 8/29: 4/5    Time 12    Period Weeks    Status Not Met      PT LONG TERM GOAL #5   Title Pt will demonstrate decrease in NDI by at least 19% in order to demonstrate clinically significant reduction in disability related to neck/TMJ injury/pain    Baseline 8/3: 22%    Time 12    Period Weeks    Status New      PT LONG TERM GOAL #6   Title Patient will report decreased worst VAS of jaw to <3/10 for decreased pain with eating and improved quality of life    Baseline 8/3: 6/10    Time 12    Period Weeks    Status New      PT LONG TERM GOAL #7   Title Patient will report little to no clicking/catching with chewing for improved quality of life and ADL performance    Baseline 8/3: audible clicking/catching    Time 12    Period Weeks    Status New                   Plan - 08/17/21 1533     Clinical Impression Statement Patient has reached plateau of shoulder and will be discharged from shoulder while keeping TMJ POC . Patient agreeable to reach out to physician due to plateau and continued pain of shoulder. Multiple trigger points found in the  right side of the mouth compared to left. Pt will benefit from skilled PT services to address deficits and return to pain-free function at home and with caregiving    Personal Factors and Comorbidities Comorbidity 1;Comorbidity 2;Comorbidity 3+;Past/Current Experience;Social Background;Time since onset of injury/illness/exacerbation;Transportation;Fitness;Other    Comorbidities Pertinent: recent hx of COVID19, HTN, hyperlipidemia, pre-diabetes, AFIB, CHF, depression, anxiety, OA, atypical migraine, asthma.    Examination-Activity Limitations Bathing;Bed Mobility;Hygiene/Grooming;Lift;Caring for Others;Reach Overhead;Carry;Dressing;Sleep;Other;Self Feeding    Examination-Participation Restrictions Laundry;Cleaning;Driving;Community Activity;Other;Yard Work;Shop;Meal Prep;Volunteer    Stability/Clinical Decision Making Stable/Uncomplicated    Rehab Potential Good    PT Frequency 2x / week    PT Duration 12 weeks    PT Treatment/Interventions ADLs/Self Care Home Management;Biofeedback;Cryotherapy;Electrical Stimulation;Moist Heat;Traction;Ultrasound;DME Instruction;Functional mobility training;Therapeutic activities;Therapeutic exercise;Neuromuscular re-education;Patient/family education;Orthotic Fit/Training;Manual techniques;Scar mobilization;Passive range of motion;Dry needling;Taping;Energy conservation;Joint Manipulations;Spinal Manipulations;Gait training;Visual/perceptual remediation/compensation    PT Next Visit Plan --    PT Home Exercise Plan Issued HEP with handout: RTB rows, RTB shoulder ER pull-aparts, and upper  trap stretch; shoulder perturbation exercises, ROM for shoulder IR within pain-free range, mobilizations/distraction to R shoulder, continue training correct lifting mechanics in order to reduce strain on RUE, lifting mechanics    Consulted and Agree with Plan of Care Patient             Patient will benefit from skilled therapeutic intervention in order to improve the following  deficits and impairments:  Increased fascial restricitons, Improper body mechanics, Pain, Decreased mobility, Increased muscle spasms, Postural dysfunction, Decreased activity tolerance, Decreased endurance, Decreased range of motion, Decreased strength, Hypomobility, Impaired UE functional use, Impaired flexibility, Decreased coordination, Impaired tone  Visit Diagnosis: Cervicalgia  Muscle weakness (generalized)  Abnormal posture  Chronic right shoulder pain     Problem List Patient Active Problem List   Diagnosis Date Noted   Medicare annual wellness visit, initial 07/17/2021   TMJ (dislocation of temporomandibular joint) 07/17/2021   Encounter for screening mammogram for breast cancer 06/05/2021   Diarrhea 04/11/2020   Epigastric pain 02/19/2020   Dark stools 02/19/2020   PAF (paroxysmal atrial fibrillation) (HCC) 02/05/2019   Shortness of breath 01/27/2019   Irregular surface of cornea 12/25/2018   HPV (human papilloma virus) infection 12/01/2016   Screening mammogram, encounter for 11/29/2016   Estrogen deficiency 11/29/2016   Encounter for routine gynecological examination 11/29/2016   Grade III hemorrhoids 10/06/2016   Tachycardia 08/14/2016   Hemorrhoids 08/13/2016   Atypical migraine 08/03/2016   CAD (coronary artery disease)    AAA (abdominal aortic aneurysm) (HCC)    Chronic combined systolic (congestive) and diastolic (congestive) heart failure ([place])    Prediabetes 02/11/2016   Generalized anxiety disorder 12/08/2015   Panic attacks 07/28/2015   Sensorineural hearing loss 03/27/2013   Vitamin D deficiency 07/03/2012   H/O aortic dissection 05/12/2012   Routine general medical examination at a health care facility 09/20/2011   S/P AVR (aortic valve replacement) 01/07/2011   CORONARY ARTERY BYPASS GRAFT, HX OF 01/07/2011   Arthritis 12/20/2010   Back pain 12/15/2010   H/O dissecting abdominal aortic aneurysm repair 08/24/2010   IRRITABLE BOWEL SYNDROME  09/09/2009   Obstructive sleep apnea 08/04/2009   External hemorrhoid 06/26/2009   Osteoporosis 01/09/2009   Hyperlipidemia 07/26/2007   Depression with anxiety 07/26/2007   Essential hypertension 07/26/2007   Allergic rhinitis 07/26/2007   Asthma 07/26/2007   GERD 07/26/2007   Osteoarthritis 07/26/2007   Ocular migraine 07/26/2007   Mild intermittent asthma without complication 45/91/3685   Janna Arch, PT, DPT  08/17/2021, 3:36 PM  [facility] MAIN [facility] SERVICES [address] [place], [place], [address] Phone: [phone]   Fax:  [phone]  Name: Rachel Torres MRN: [id number] Date of Birth: 06-20-1956

## 2021-08-18 ENCOUNTER — Encounter (INDEPENDENT_AMBULATORY_CARE_PROVIDER_SITE_OTHER): Payer: Self-pay | Admitting: Family Medicine

## 2021-08-18 ENCOUNTER — Ambulatory Visit (INDEPENDENT_AMBULATORY_CARE_PROVIDER_SITE_OTHER): Payer: Medicare HMO | Admitting: Family Medicine

## 2021-08-18 VITALS — BP 100/63 | HR 82 | Temp 98.0°F | Ht 65.0 in | Wt 171.0 lb

## 2021-08-18 DIAGNOSIS — R7303 Prediabetes: Secondary | ICD-10-CM | POA: Diagnosis not present

## 2021-08-18 DIAGNOSIS — Z6835 Body mass index (BMI) 35.0-35.9, adult: Secondary | ICD-10-CM

## 2021-08-18 DIAGNOSIS — E559 Vitamin D deficiency, unspecified: Secondary | ICD-10-CM | POA: Diagnosis not present

## 2021-08-18 MED ORDER — SEMAGLUTIDE (1 MG/DOSE) 4 MG/3ML ~~LOC~~ SOPN
1.0000 mg | PEN_INJECTOR | SUBCUTANEOUS | 0 refills | Status: DC
Start: 2021-08-18 — End: 2021-09-09

## 2021-08-19 ENCOUNTER — Ambulatory Visit: Payer: Medicare HMO | Admitting: Vascular Surgery

## 2021-08-19 ENCOUNTER — Ambulatory Visit (INDEPENDENT_AMBULATORY_CARE_PROVIDER_SITE_OTHER): Payer: Medicare HMO | Admitting: Vascular Surgery

## 2021-08-19 ENCOUNTER — Ambulatory Visit: Payer: Medicare HMO

## 2021-08-19 ENCOUNTER — Other Ambulatory Visit: Payer: Self-pay

## 2021-08-19 ENCOUNTER — Encounter: Payer: Self-pay | Admitting: Vascular Surgery

## 2021-08-19 VITALS — BP 138/35 | HR 81 | Temp 97.2°F | Wt 176.5 lb

## 2021-08-19 DIAGNOSIS — I7101 Dissection of thoracic aorta: Secondary | ICD-10-CM | POA: Diagnosis not present

## 2021-08-19 DIAGNOSIS — I71019 Dissection of thoracic aorta, unspecified: Secondary | ICD-10-CM

## 2021-08-19 NOTE — Progress Notes (Signed)
Chief Complaint:   OBESITY Rachel Torres is here to discuss Rachel Torres progress with Rachel Torres obesity treatment plan along with follow-up of Rachel Torres obesity related diagnoses. Apolonia is on following a lower carbohydrate, vegetable and lean protein rich diet plan and states Rachel Torres is following Rachel Torres eating plan approximately 90% of the time. Adrian states Rachel Torres is doing 0 minutes 0 times per week.  Today's visit was #: 18 Starting weight: 213 lbs Starting date: 05/05/2020 Today's weight: 171 lbs Today's date: 08/18/2021 Total lbs lost to date: 42 Total lbs lost since last in-office visit: 1  Interim History: Enaya continues to lose weight even with extra temptations and challenges with caring for multiple family members. Rachel Torres is feeling well with weight loss.  Subjective:   1. Pre-diabetes Kareema is stable on Ozempic, but Rachel Torres still notes polyphagia.  2. Vitamin D deficiency Estrellita is stable on Vit D, and Rachel Torres denies nausea, vomiting, or muscle weakness.  Assessment/Plan:   1. Pre-diabetes Daylah agreed to increase Ozempic to 1 mg q weekly with no refills. Rachel Torres will continue to work on weight loss, exercise, and decreasing simple carbohydrates to help decrease the risk of diabetes.   - Semaglutide, 1 MG/DOSE, 4 MG/3ML SOPN; Inject 1 mg as directed once a week.  Dispense: 3 mL; Refill: 0  2. Vitamin D deficiency Low Vitamin D level contributes to fatigue and are associated with obesity, breast, and colon cancer. Lavina will continue Vitamin D 2,000 IU q daily and will follow-up for routine testing of Vitamin D, at least 2-3 times per year to avoid over-replacement.  3. Obesity with current BMI 28.6 Kavina is currently in the action stage of change. As such, Rachel Torres goal is to continue with weight loss efforts. Rachel Torres has agreed to following a lower carbohydrate, vegetable and lean protein rich diet plan.   Behavioral modification strategies: increasing lean protein intake and decreasing simple  carbohydrates.  Kellina has agreed to follow-up with our clinic in 3 to 4 weeks. Rachel Torres was informed of the importance of frequent follow-up visits to maximize Rachel Torres success with intensive lifestyle modifications for Rachel Torres multiple health conditions.   Objective:   Blood pressure 100/63, pulse 82, temperature 98 F (36.7 C), height '5\' 5"'$  (1.651 m), weight 171 lb (77.6 kg), SpO2 97 %. Body mass index is 28.46 kg/m.  General: Cooperative, alert, well developed, in no acute distress. HEENT: Conjunctivae and lids unremarkable. Cardiovascular: Regular rhythm.  Lungs: Normal work of breathing. Neurologic: No focal deficits.   Lab Results  Component Value Date   CREATININE 0.80 08/13/2021   BUN 14 07/10/2021   NA 135 07/10/2021   K 4.8 07/10/2021   CL 99 07/10/2021   CO2 30 07/10/2021   Lab Results  Component Value Date   ALT 15 07/10/2021   AST 16 07/10/2021   ALKPHOS 62 07/10/2021   BILITOT 0.6 07/10/2021   Lab Results  Component Value Date   HGBA1C 5.6 07/10/2021   HGBA1C 5.5 02/26/2021   HGBA1C 5.7 (H) 10/29/2020   HGBA1C 5.6 07/23/2020   HGBA1C 5.9 (H) 05/05/2020   Lab Results  Component Value Date   INSULIN 17.3 02/26/2021   INSULIN 13.9 10/29/2020   INSULIN 22.5 07/23/2020   INSULIN 25.2 (H) 05/05/2020   Lab Results  Component Value Date   TSH 1.54 07/10/2021   Lab Results  Component Value Date   CHOL 166 07/10/2021   HDL 55.20 07/10/2021   LDLCALC 89 07/10/2021   LDLDIRECT 189.4 10/13/2011  TRIG 105.0 07/10/2021   CHOLHDL 3 07/10/2021   Lab Results  Component Value Date   VD25OH 74.87 07/10/2021   VD25OH 62.2 02/26/2021   VD25OH 59.5 10/29/2020   Lab Results  Component Value Date   WBC 5.6 07/10/2021   HGB 13.9 07/10/2021   HCT 41.3 07/10/2021   MCV 96.2 07/10/2021   PLT 274.0 07/10/2021   Lab Results  Component Value Date   FERRITIN 105.0 12/11/2010    Obesity Behavioral Intervention:   Approximately 15 minutes were spent on the discussion  below.  ASK: We discussed the diagnosis of obesity with Brian today and Ryian agreed to give [place] permission to discuss obesity behavioral modification therapy today.  ASSESS: Anella has the diagnosis of obesity and Rachel Torres BMI today is 28.6. Maronda is in the action stage of change.   ADVISE: Elisheba was educated on the multiple health risks of obesity as well as the benefit of weight loss to improve Rachel Torres health. Rachel Torres was advised of the need for long term treatment and the importance of lifestyle modifications to improve Rachel Torres current health and to decrease Rachel Torres risk of future health problems.  AGREE: Multiple dietary modification options and treatment options were discussed and Joydan agreed to follow the recommendations documented in the above note.  ARRANGE: Roxene was educated on the importance of frequent visits to treat obesity as outlined per CMS and USPSTF guidelines and agreed to schedule Rachel Torres next follow up appointment today.  Attestation Statements:   Reviewed by clinician on day of visit: allergies, medications, problem list, medical history, surgical history, family history, social history, and previous encounter notes.   I, Trixie Dredge, am acting as transcriptionist for [name], MD.  I have reviewed the above documentation for accuracy and completeness, and I agree with the above. -  [name], MD

## 2021-08-19 NOTE — Progress Notes (Signed)
Patient ID: Rachel Torres, female   DOB: May 03, 1956, 65 y.o.   MRN: [id number]  Reason for Consult: Follow-up   Referred by [name], MD  Subjective:     HPI:  Rachel Torres is a 66 y.o. female history of an arch aneurysm and coronary artery bypass performed in 2011 in [place].  She is now followed for chronic dissection by Dr. Donnetta Hutching who recently retired.  Her last evaluation was CT scan 2 years ago.  She now follows up with new CT scan.  She has no new back or abdominal pain.  No evidence of lower extremity ischemia.  Past Medical History:  Diagnosis Date   AAA (abdominal aortic aneurysm) ([place])    a. Chronic w/o evidence of aneurysmal dil on CTA 01/2016.   Alcohol abuse    Allergy    Anemia    Anxiety    Asthma    CAD (coronary artery disease)    a. 08/2010 s/p CABG x 1 (VG->RCA) @ time of Ao dissection repair; b. 01/2016 Lexiscan MV: mid antsept/apical defect w/ ? peri-infarct ischemia-->likely attenuation-->Med Rx.   Chewing difficulty    Chronic combined systolic (congestive) and diastolic (congestive) heart failure ([place])    a. 2012 EF 30-35%; b. 04/2015 EF 40-45%; c. 01/2016 Echo: Ef 55-65%, nl AoV bioprosthesis, nl RV, nl PASP.   Constipation    Depression    Edema of both lower extremities    Gallbladder sludge    GERD (gastroesophageal reflux disease)    Heart failure (HCC)    Heart valve problem    History of Bicuspid Aortic Valve    a. 08/2010 s/p AVR @ time of Ao dissection repair; b. 01/2016 Echo: Ef 55-65%, nl AoV bioprosthesis, nl RV, nl PASP.   Hx of repair of dissecting thoracic aortic aneurysm, [place] type A    a. 08/2010 s/p repair with AVR and VG->RCA; b. 01/2016 CTA: stable appearance of Asc Thoracic Aortic graft. Opacification of flase lumen of chronic abd Ao dissection w/ retrograde flow through lumbar arteries. No aneurysmal dil of flase lumen.   Hyperlipidemia    Hypertension    Hypertensive heart disease    IBS (irritable bowel syndrome)     Internal hemorrhoids    Kidney problem    Marfan syndrome    pt denies   Migraine    Obstructive sleep apnea    Osteoarthritis    Osteoporosis    Palpitations    Pneumonia    RA (rheumatoid arthritis) ([place])    a. Followed by Dr. [name]   Family History  Problem Relation Age of Onset   Hypertension Mother    Depression Mother    Osteoporosis Mother    Stroke Mother    Irritable bowel syndrome Mother    Asthma Mother    Heart disease Mother    Thyroid disease Mother    Anxiety disorder Mother    Arthritis Sister    Diabetes Sister    Heart disease Sister    Asthma Sister    Migraines Sister    Nephrolithiasis Sister    Osteoporosis Sister    Arthritis Sister    Asthma Sister    Migraines Sister    Other Father 37   Stroke Maternal Grandmother    Colon cancer Maternal Grandmother    Lymphoma Maternal Grandmother    Migraines Maternal Grandmother    Aneurysm Paternal Grandmother        brain   Diabetes Paternal Grandmother  Arthritis Paternal Grandmother    Arthritis Paternal Grandfather    Diabetes Paternal Grandfather    Other Brother        prediabeties   Asthma Brother    Migraines Brother    Lung cancer Maternal Grandfather    Melanoma Maternal Grandfather    Past Surgical History:  Procedure Laterality Date   ABDOMINAL AORTIC ANEURYSM REPAIR     CARDIAC CATHETERIZATION  2001   [place]     COLONOSCOPY     ESOPHAGOGASTRODUODENOSCOPY (EGD) WITH PROPOFOL N/A 05/08/2020   Procedure: ESOPHAGOGASTRODUODENOSCOPY (EGD) WITH PROPOFOL;  Surgeon: Virgel Manifold, MD;  Location: [facility] ENDOSCOPY;  Service: Endoscopy;  Laterality: N/A;   FRACTURE SURGERY Right    shoulder replacement   HEMORRHOID BANDING     ORIF DISTAL RADIUS FRACTURE Left    UPPER GASTROINTESTINAL ENDOSCOPY      Short Social History:  Social History   Tobacco Use   Smoking status: Former    Packs/day: 0.75    Years: 1.50    Pack years:  1.13    Types: Cigarettes    Quit date: 12/20/1978    Years since quitting: 42.6   Smokeless tobacco: Never  Substance Use Topics   Alcohol use: Yes    Alcohol/week: 0.0 standard drinks    Comment: 2-3 glasses of red wine per week    Allergies  Allergen Reactions   Fosamax [Alendronate Sodium]     GI upset    Ginzing [Ginseng] Other (See Comments)    Hyper and jitery    Hydrocod Polst-Cpm Polst Er Itching   Hydrocodone-Acetaminophen Itching   Imitrex [Sumatriptan]     Contraindicated w/ heart condition   Oxycodone Itching   Peanut-Containing Drug Products Other (See Comments)    Reaction:  Migraines    Tramadol Other (See Comments)    itching   Hydrocodone-Guaifenesin Itching   Prednisone Rash   Tape Itching    Current Outpatient Medications  Medication Sig Dispense Refill   acetaminophen (TYLENOL) 500 MG tablet Take 500 mg by mouth every 6 (six) hours as needed.     albuterol (VENTOLIN HFA) 108 (90 Base) MCG/ACT inhaler Inhale 1-2 puffs into the lungs every 6 (six) hours as needed for wheezing or shortness of breath. 3 each 3   ALPRAZolam (XANAX) 0.5 MG tablet Take 1 tablet (0.5 mg total) by mouth daily as needed for anxiety. 30 tablet 0   aspirin EC 81 MG tablet Take 81 mg by mouth at bedtime.     atorvastatin (LIPITOR) 20 MG tablet Take 1 tablet (20 mg total) by mouth daily. Time to schedule your yearly appt with Dr. [name] in Nov. For continued refills 90 tablet 0   carvedilol (COREG) 6.25 MG tablet TAKE 1 TABLET TWICE DAILY WITH A MEAL 180 tablet 1   Cholecalciferol (VITAMIN D3) 50 MCG (2000 UT) CAPS Take 2,000 capsules by mouth.     cyclobenzaprine (FLEXERIL) 10 MG tablet Take 0.5-1 tablets (5-10 mg total) by mouth 3 (three) times daily as needed. For headache 90 tablet 1   furosemide (LASIX) 20 MG tablet TAKE 1 TABLET DAILY. MAY TAKE ADDITIONAL TAB IN AFTERNOON AS NEEDED FOR SWELLING, SHORTNESS OF BREATH, OR WEIGHT GAIN. 180 tablet 0   Insulin Pen Needle (BD PEN NEEDLE  NANO U/F) 32G X 4 MM MISC Use Nano needle with Ozempic 100 each 0   Krill Oil (OMEGA-3) 500 MG CAPS Take 1 capsule by mouth daily.  lisinopril (ZESTRIL) 5 MG tablet TAKE 1/2 TABLET EVERY DAY. Time to schedule your yearly appt with Dr. [name] in Nov. For continued refills 45 tablet 1   magnesium gluconate (MAGONATE) 500 MG tablet Take 500 mg by mouth at bedtime.     Melatonin 5 MG SUBL Place under the tongue.     Misc Natural Products (TART CHERRY ADVANCED) CAPS Take 1 capsule by mouth daily.     naproxen sodium (ANAPROX) 220 MG tablet Take 440 mg by mouth 2 (two) times daily as needed (for headaches/pain).     nystatin (MYCOSTATIN/NYSTOP) powder Apply 1 application topically 2 (two) times daily. Apply to affected area twice a day for 14 days 60 g 0   omeprazole (PRILOSEC) 40 MG capsule Take 1 capsule (40 mg total) by mouth daily. 90 capsule 3   polyethylene glycol powder (GLYCOLAX/MIRALAX) 17 GM/SCOOP powder Take 17 g by mouth as needed. 255 g 0   potassium chloride (KLOR-CON) 10 MEQ tablet TAKE 1 TABLET TWICE A DAY AS NEEDED. TAKE ADDITIONAL TABLET WHEN INCREASING FUROSEMIDE 270 tablet 1   promethazine (PHENERGAN) 25 MG tablet Take 0.5 tablets (12.5 mg total) by mouth every 8 (eight) hours as needed for nausea. 90 tablet 1   Semaglutide, 1 MG/DOSE, 4 MG/3ML SOPN Inject 1 mg as directed once a week. 3 mL 0   Semaglutide,0.25 or 0.'5MG'$ /DOS, (OZEMPIC, 0.25 OR 0.5 MG/DOSE,) 2 MG/1.5ML SOPN Inject 0.5 mg into the skin once a week. 1.5 mL 0   Simethicone (MYLANTA GAS PO) Take by mouth 3 times/day as needed-between meals & bedtime.     venlafaxine XR (EFFEXOR-XR) 150 MG 24 hr capsule TAKE 1 CAPSULE EVERY DAY WITH BREAKFAST 90 capsule 1   No current facility-administered medications for this visit.    Review of Systems  Constitutional:  Constitutional negative. HENT: HENT negative.  Eyes: Eyes negative.  Respiratory: Respiratory negative.  Cardiovascular: Cardiovascular negative.  GI:  Gastrointestinal negative.  Musculoskeletal: Musculoskeletal negative.  Skin: Skin negative.  Neurological: Neurological negative. Hematologic: Hematologic/lymphatic negative.  Psychiatric: Psychiatric negative.       Objective:  Objective   Vitals:   08/19/21 1310  BP: (!) 138/35  Pulse: 81  Temp: (!) 97.2 F (36.2 C)  TempSrc: Skin  SpO2: 96%  Weight: 176 lb 8 oz (80.1 kg)   Body mass index is 29.37 kg/m.  Physical Exam HENT:     Head: Normocephalic.     Nose:     Comments: Wearing a mask Eyes:     Pupils: Pupils are equal, round, and reactive to light.  Cardiovascular:     Rate and Rhythm: Normal rate.     Pulses: Normal pulses.  Pulmonary:     Effort: Pulmonary effort is normal.  Abdominal:     General: Abdomen is flat.  Musculoskeletal:        General: Normal range of motion.     Cervical back: Normal range of motion.  Skin:    General: Skin is warm.     Capillary Refill: Capillary refill takes less than 2 seconds.  Neurological:     General: No focal deficit present.     Mental Status: She is alert.  Psychiatric:        Mood and Affect: Mood normal.        Thought Content: Thought content normal.        Judgment: Judgment normal.    Data: Chest CTA impression:   1. Stable sequela of open repair of ascending thoracic  aorta and coronary artery bypass graft without evidence of complication. 2. Interval thrombosis of the false lumen of the residual descending thoracic aortic dissection superior to the level of the diaphragmatic hiatus with reconstitution of the false lumen below the level of the renal arteries. 3.  Aortic Atherosclerosis (ICD10-I70.0).   Abdomen and pelvic CTA impression:   1. Interval thrombosis of the superior aspect of the false lumen of the residual descending thoracic aortic dissection with reconstitution the false lumen below the level of the renal arteries. The dissection is again noted to extend to the level of the  distal aspect the left common iliac artery, not resulting in a hemodynamically significant stenosis. No perivascular stranding. 2. Stable mild aneurysmal dilatation of the infrarenal abdominal aorta measuring 3 cm in diameter. Aortic aneurysm NOS (ICD10-I71.9). 3. Stable fusiform aneurysmal dilatation of the left common iliac artery measuring 1.6 cm in diameter.       Assessment/Plan:     65 year old female with history as noted above.  She will follow-up in 3 years with repeat CT scan.  We discussed the signs and symptoms of malperfusion and expansion and she demonstrates good understanding.  Short of this we will see her in 3 years.     Broadus John MD [facility]

## 2021-08-20 ENCOUNTER — Encounter: Payer: Self-pay | Admitting: *Deleted

## 2021-08-25 ENCOUNTER — Ambulatory Visit: Payer: Medicare HMO

## 2021-08-25 ENCOUNTER — Ambulatory Visit (INDEPENDENT_AMBULATORY_CARE_PROVIDER_SITE_OTHER): Payer: Medicare HMO | Admitting: Psychologist

## 2021-08-25 ENCOUNTER — Ambulatory Visit: Payer: Medicare HMO | Attending: Family Medicine

## 2021-08-25 DIAGNOSIS — M25511 Pain in right shoulder: Secondary | ICD-10-CM | POA: Insufficient documentation

## 2021-08-25 DIAGNOSIS — R278 Other lack of coordination: Secondary | ICD-10-CM | POA: Insufficient documentation

## 2021-08-25 DIAGNOSIS — M542 Cervicalgia: Secondary | ICD-10-CM | POA: Insufficient documentation

## 2021-08-25 DIAGNOSIS — F411 Generalized anxiety disorder: Secondary | ICD-10-CM

## 2021-08-25 DIAGNOSIS — M545 Low back pain, unspecified: Secondary | ICD-10-CM | POA: Insufficient documentation

## 2021-08-25 DIAGNOSIS — M5412 Radiculopathy, cervical region: Secondary | ICD-10-CM | POA: Insufficient documentation

## 2021-08-25 DIAGNOSIS — R2689 Other abnormalities of gait and mobility: Secondary | ICD-10-CM | POA: Insufficient documentation

## 2021-08-25 DIAGNOSIS — M546 Pain in thoracic spine: Secondary | ICD-10-CM | POA: Insufficient documentation

## 2021-08-25 DIAGNOSIS — R293 Abnormal posture: Secondary | ICD-10-CM | POA: Insufficient documentation

## 2021-08-25 DIAGNOSIS — M6281 Muscle weakness (generalized): Secondary | ICD-10-CM | POA: Insufficient documentation

## 2021-08-25 DIAGNOSIS — G8929 Other chronic pain: Secondary | ICD-10-CM | POA: Insufficient documentation

## 2021-08-27 ENCOUNTER — Ambulatory Visit: Payer: Medicare HMO

## 2021-08-29 ENCOUNTER — Other Ambulatory Visit: Payer: Self-pay | Admitting: Cardiovascular Disease

## 2021-08-31 ENCOUNTER — Ambulatory Visit: Payer: Medicare HMO

## 2021-08-31 ENCOUNTER — Other Ambulatory Visit: Payer: Self-pay

## 2021-08-31 DIAGNOSIS — M6281 Muscle weakness (generalized): Secondary | ICD-10-CM | POA: Diagnosis not present

## 2021-08-31 DIAGNOSIS — R293 Abnormal posture: Secondary | ICD-10-CM | POA: Diagnosis not present

## 2021-08-31 DIAGNOSIS — G8929 Other chronic pain: Secondary | ICD-10-CM | POA: Diagnosis not present

## 2021-08-31 DIAGNOSIS — M546 Pain in thoracic spine: Secondary | ICD-10-CM | POA: Diagnosis not present

## 2021-08-31 DIAGNOSIS — R2689 Other abnormalities of gait and mobility: Secondary | ICD-10-CM | POA: Diagnosis not present

## 2021-08-31 DIAGNOSIS — M542 Cervicalgia: Secondary | ICD-10-CM

## 2021-08-31 DIAGNOSIS — M545 Low back pain, unspecified: Secondary | ICD-10-CM | POA: Diagnosis present

## 2021-08-31 DIAGNOSIS — R278 Other lack of coordination: Secondary | ICD-10-CM | POA: Diagnosis not present

## 2021-08-31 DIAGNOSIS — M25511 Pain in right shoulder: Secondary | ICD-10-CM | POA: Diagnosis not present

## 2021-08-31 DIAGNOSIS — M5412 Radiculopathy, cervical region: Secondary | ICD-10-CM | POA: Diagnosis not present

## 2021-08-31 NOTE — Therapy (Signed)
[place] [facility] SERVICES [address] [place], [place], [address] Phone: [phone]   Fax:  [phone]  Physical Therapy Treatment  Patient Details  Name: Rachel Torres MRN: [id number] Date of Birth: 1956/05/24 Referring Provider (PT): Gregor Hams, MD   Encounter Date: 08/31/2021   PT End of Session - 08/31/21 1502     Visit Number 34    Number of Visits 91    Date for PT Re-Evaluation 10/21/21    Authorization Type Eval performed 04/19/7000 (recert for 06/22/9448); new recert for 6/75/9163    Authorization Time Period 4/10 PN 8/15    PT Start Time 1345    PT Stop Time 1429    PT Time Calculation (min) 44 min    Activity Tolerance Patient tolerated treatment well    Behavior During Therapy [facility] for tasks assessed/performed             Past Medical History:  Diagnosis Date   AAA (abdominal aortic aneurysm) ([place])    a. Chronic w/o evidence of aneurysmal dil on CTA 01/2016.   Alcohol abuse    Allergy    Anemia    Anxiety    Asthma    CAD (coronary artery disease)    a. 08/2010 s/p CABG x 1 (VG->RCA) @ time of Ao dissection repair; b. 01/2016 Lexiscan MV: mid antsept/apical defect w/ ? peri-infarct ischemia-->likely attenuation-->Med Rx.   Chewing difficulty    Chronic combined systolic (congestive) and diastolic (congestive) heart failure ([place])    a. 2012 EF 30-35%; b. 04/2015 EF 40-45%; c. 01/2016 Echo: Ef 55-65%, nl AoV bioprosthesis, nl RV, nl PASP.   Constipation    Depression    Edema of both lower extremities    Gallbladder sludge    GERD (gastroesophageal reflux disease)    Heart failure (HCC)    Heart valve problem    History of Bicuspid Aortic Valve    a. 08/2010 s/p AVR @ time of Ao dissection repair; b. 01/2016 Echo: Ef 55-65%, nl AoV bioprosthesis, nl RV, nl PASP.   Hx of repair of dissecting thoracic aortic aneurysm, [place] type A    a. 08/2010 s/p repair with AVR and VG->RCA; b. 01/2016 CTA: stable appearance  of Asc Thoracic Aortic graft. Opacification of flase lumen of chronic abd Ao dissection w/ retrograde flow through lumbar arteries. No aneurysmal dil of flase lumen.   Hyperlipidemia    Hypertension    Hypertensive heart disease    IBS (irritable bowel syndrome)    Internal hemorrhoids    Kidney problem    Marfan syndrome    pt denies   Migraine    Obstructive sleep apnea    Osteoarthritis    Osteoporosis    Palpitations    Pneumonia    RA (rheumatoid arthritis) ([place])    a. Followed by Dr. [name]    Past Surgical History:  Procedure Laterality Date   ABDOMINAL AORTIC ANEURYSM REPAIR     CARDIAC CATHETERIZATION  2001   [place]     ESOPHAGOGASTRODUODENOSCOPY (EGD) WITH PROPOFOL N/A 05/08/2020   Procedure: ESOPHAGOGASTRODUODENOSCOPY (EGD) WITH PROPOFOL;  Surgeon: Virgel Manifold, MD;  Location: [facility] ENDOSCOPY;  Service: Endoscopy;  Laterality: N/A;   FRACTURE SURGERY Right    shoulder replacement   HEMORRHOID BANDING     ORIF DISTAL RADIUS FRACTURE Left    UPPER GASTROINTESTINAL ENDOSCOPY      There  were no vitals filed for this visit.   Subjective Assessment - 08/31/21 1500     Subjective Patient drove to family vacation last weekend. Had a bad migraine saturday. Is going to be getting three crowns soon.    Pertinent History Pt is a 65 y/o female presenting to therapy due to R shoulder pain. Pt had R shoulder replacement 12-13 years ago.  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 She says shoulder pain started this past January as she was helping lift her husband and his assistive devices. She reports she has since stopped lifting him and his walker as they have caregivers who come to the home for 16 hours/week and can help. Pt is most concerned that her shoulder hurts when she drives and washes her hair. Her shoulder does not hurt constantly, but she states she has pain  "the more I use it." She feels a catch and stabbing sensation in R anterior shoulder that can travel into bicep and that sometimes the pain is a burning sensation. She sometimes has pain in L shoulder.  She rates her worst R shoulder pain as 10/10, best pain is 0/10. Her R shoulder pain is currently 1.5/10. She reports that rest, ice and naproxen help with pain relief. She denies redness/swelling/temp difference and N/T in UEs.  Patient has been seen by other therapists in this clinic previously for cervicalgia and back related injuries/pain. Pt PMH includes COVID19, upper GI stomach issues, aorta type A dissection (2011) with aortic valve replacement, bypass of R coronary artery, HTN, hyperlipidemia, pre-diabetes, AFIB, CHF, depression, anxiety, OA, GERD, atypical migraine, asthma, and allergic rhinitis. Additional diagnosis of TMJ has been added with patient reporting pain has been occuring for ~40 years, worsens with stress and grinding of teeth.    Limitations Lifting;House hold activities;Other (comment)    How long can you sit comfortably? Not affected    How long can you stand comfortably? Not affected    How long can you walk comfortably? Pt reports she has pain only if she is carrying something    Diagnostic tests DG R shoulder 02/09/2021 per chart "Impression: Possible hardware loosening post total shoulder arthroplasty. No  evidence of acute fracture or dislocation"    Patient Stated Goals Be able to drive without pain    Currently in Pain? Yes    Pain Score 2     Pain Location Jaw    Pain Orientation Right;Left    Pain Descriptors / Indicators Aching    Pain Type Chronic pain    Pain Onset More than a month ago    Pain Frequency Constant                Manual:  TMJ:  -gentle anterior and inferior mobilization of bilateral jaw 8x 30 seconds each side  -supine cervical mobilizations with PA mobilizations grade II x 6 minutes  -suboccipital release 3x30 second holds -cervical  side bend with overpressure 2x30 second holds -cervical rotation with overpressure 2x 30 second holds  -J mobilization 30 seconds x 2 trials -scapular retraction and depression R side x 30 seconds x 2 trials   TherEx: Cervical extension and flexion 10x with focus on full range and jaw retraction and protraction Chin tucks; 10 x 3 second holds Upper trap stretch 60 seconds each side.   Supine jaw protrustion 10x with cue for arc of motion within pain free range Supine jaw abduction 12x within pain free  range.    Trigger Point Dry Needling (TDN), unbilled Education performed with patient regarding potential benefit of TDN. Reviewed precautions and risks with patient. Reviewed special precautions/risks over lung fields which include pneumothorax. Reviewed signs and symptoms of pneumothorax and advised pt to go to ER immediately if these symptoms develop advise them of dry needling treatment. Extensive time spent with pt to ensure full understanding of TDN risks. Pt provided verbal consent to treatment. TDN performed to  with 0.20 x .30 single needle placements with local twitch response (LTR). Pistoning technique utilized. Improved pain-free motion following intervention. Musculature addressed is as followed: masseter bilateral, pterygoid bilateral, temporal region bilateral, R upper trap. X 12 minutes        Pt educated throughout session about proper posture and technique with exercises. Improved exercise technique, movement at target joints, use of target muscles after min to mod verbal, visual, tactile cues    Patient is improving with pain free range of motion by end of session. Patient continues to be highly motivated throughout physical therapy session. Increased tension of right temporal and temporal mandibular region produced by end of session. Pt will benefit from skilled PT services to address deficits and return to pain-free function at home and with  caregiving                    PT Education - 08/31/21 1501     Education Details manual, TMJ    Person(s) Educated Patient    Methods Explanation;Demonstration;Tactile cues;Verbal cues    Comprehension Verbalized understanding;Returned demonstration;Verbal cues required;Tactile cues required              PT Short Term Goals - 07/29/21 1445       PT SHORT TERM GOAL #1   Title Patient will be independent in home exercise program to improve strength/mobility for better functional independence with ADLs.    Baseline 02/24/2021 Pt issued HEP; 5/19: HEP to be advanced, pt has been unable to perform HEP consistently d/t spouse surgery and son's wedding.; 7/7: pt reports she has not been performing HEP "as often as I'm supposed to." 8/10: HEP compliant    Time 6    Period Weeks    Status Achieved    Target Date 08/06/21               PT Long Term Goals - 08/17/21 1522       PT LONG TERM GOAL #1   Title Patient will report a worst pain of 3/10  in last 7 days in her R shoulder to improve tolerance with ADLs and reduced symptoms with activities.    Baseline 02/24/2021: Pt reports worst pain as 10/10.; 5/18: 4/10 is worst pain; 7/7: pt reports worst pain 5/10 8/10: 7/10 pain 8/29: 7/10    Time 12    Period Weeks    Status Not Met      PT LONG TERM GOAL #2   Title Patient will decrease Quick DASH scores by > 8 points demonstrating reduced self-reported upper extremity disability.    Baseline 02/24/2021: QuickDash Disability/symptom score 40, Work score 37.5; 5/18: Disability/symtpom 38.6, Work 43.75; 7/7: disability/sx score 50%, work 62.5% 8/10: 37.7% caregiver work 37.5% 8/29: 52.3%, caregiving/work 56%    Time 12    Period Weeks    Status Not Met      PT LONG TERM GOAL #3   Title Patient will increase FOTO score to equal to or greater than 60 to demonstrate statistically significant  improvement in mobility and quality of life.    Baseline 02/24/2021: 55; 5/18: 60;  7/7: 56 8/10: 56% 8/29: 52%    Time 12    Period Weeks    Status Not Met      PT LONG TERM GOAL #4   Title Patient will increase BUE MMT scores by at least 1/2 point as to improve functional strength and endurance for overhead activities and ADLs such as driving and washing her hair.    Baseline 02/24/2021: L/R shoulder flexion 4/5, 3+/5; shoulder abduction 4/5 B; shoulder extension 4+5 B; shoulder IR/ER both grossly 4/5 B; elbow flexion/extension 5/5 for both B; wrist flexion/extension 5/5 for both B; 5/18: L shoulder is grossly 4+/5.  R shoulder flexion 3+/5; R shoulder abduction 4-/5; R shoulder IR/ER both 4-/5 (B); R elbow flexion 4/5, elbow ext. 5/5 B, shoulder ext. 5/5 B; 7/7:  L shoulder grossly 4+/5, R shoulder 4/5 except shoulder flexion 4-/5, R shoulder IR/ER pain limited to 3/5 8/10: grossly 4/5 with IR/ER painful limiting full range 8/29: 4/5    Time 12    Period Weeks    Status Not Met      PT LONG TERM GOAL #5   Title Pt will demonstrate decrease in NDI by at least 19% in order to demonstrate clinically significant reduction in disability related to neck/TMJ injury/pain    Baseline 8/3: 22%    Time 12    Period Weeks    Status New      PT LONG TERM GOAL #6   Title Patient will report decreased worst VAS of jaw to <3/10 for decreased pain with eating and improved quality of life    Baseline 8/3: 6/10    Time 12    Period Weeks    Status New      PT LONG TERM GOAL #7   Title Patient will report little to no clicking/catching with chewing for improved quality of life and ADL performance    Baseline 8/3: audible clicking/catching    Time 12    Period Weeks    Status New                   Plan - 08/31/21 1627     Clinical Impression Statement Patient is improving with pain free range of motion by end of session. Patient continues to be highly motivated throughout physical therapy session. Increased tension of right temporal and temporal mandibular region produced  by end of session. Pt will benefit from skilled PT services to address deficits and return to pain-free function at home and with caregiving    Personal Factors and Comorbidities Comorbidity 1;Comorbidity 2;Comorbidity 3+;Past/Current Experience;Social Background;Time since onset of injury/illness/exacerbation;Transportation;Fitness;Other    Comorbidities Pertinent: recent hx of COVID19, HTN, hyperlipidemia, pre-diabetes, AFIB, CHF, depression, anxiety, OA, atypical migraine, asthma.    Examination-Activity Limitations Bathing;Bed Mobility;Hygiene/Grooming;Lift;Caring for Others;Reach Overhead;Carry;Dressing;Sleep;Other;Self Feeding    Examination-Participation Restrictions Laundry;Cleaning;Driving;Community Activity;Other;Yard Work;Shop;Meal Prep;Volunteer    Stability/Clinical Decision Making Stable/Uncomplicated    Rehab Potential Good    PT Frequency 2x / week    PT Duration 12 weeks    PT Treatment/Interventions ADLs/Self Care Home Management;Biofeedback;Cryotherapy;Electrical Stimulation;Moist Heat;Traction;Ultrasound;DME Instruction;Functional mobility training;Therapeutic activities;Therapeutic exercise;Neuromuscular re-education;Patient/family education;Orthotic Fit/Training;Manual techniques;Scar mobilization;Passive range of motion;Dry needling;Taping;Energy conservation;Joint Manipulations;Spinal Manipulations;Gait training;Visual/perceptual remediation/compensation    PT Home Exercise Plan Issued HEP with handout: RTB rows, RTB shoulder ER pull-aparts, and upper trap stretch; shoulder perturbation exercises, ROM for shoulder IR within pain-free range, mobilizations/distraction to R shoulder, continue training correct  lifting mechanics in order to reduce strain on RUE, lifting mechanics    Consulted and Agree with Plan of Care Patient             Patient will benefit from skilled therapeutic intervention in order to improve the following deficits and impairments:  Increased fascial  restricitons, Improper body mechanics, Pain, Decreased mobility, Increased muscle spasms, Postural dysfunction, Decreased activity tolerance, Decreased endurance, Decreased range of motion, Decreased strength, Hypomobility, Impaired UE functional use, Impaired flexibility, Decreased coordination, Impaired tone  Visit Diagnosis: Cervicalgia  Muscle weakness (generalized)  Abnormal posture     Problem List Patient Active Problem List   Diagnosis Date Noted   Medicare annual wellness visit, initial 07/17/2021   TMJ (dislocation of temporomandibular joint) 07/17/2021   Encounter for screening mammogram for breast cancer 06/05/2021   Diarrhea 04/11/2020   Epigastric pain 02/19/2020   Dark stools 02/19/2020   PAF (paroxysmal atrial fibrillation) (HCC) 02/05/2019   Shortness of breath 01/27/2019   Irregular surface of cornea 12/25/2018   HPV (human papilloma virus) infection 12/01/2016   Screening mammogram, encounter for 11/29/2016   Estrogen deficiency 11/29/2016   Encounter for routine gynecological examination 11/29/2016   Grade III hemorrhoids 10/06/2016   Tachycardia 08/14/2016   Hemorrhoids 08/13/2016   Atypical migraine 08/03/2016   CAD (coronary artery disease)    AAA (abdominal aortic aneurysm) (HCC)    Chronic combined systolic (congestive) and diastolic (congestive) heart failure ([place])    Prediabetes 02/11/2016   Generalized anxiety disorder 12/08/2015   Panic attacks 07/28/2015   Sensorineural hearing loss 03/27/2013   Vitamin D deficiency 07/03/2012   H/O aortic dissection 05/12/2012   Routine general medical examination at a health care facility 09/20/2011   S/P AVR (aortic valve replacement) 01/07/2011   CORONARY ARTERY BYPASS GRAFT, HX OF 01/07/2011   Arthritis 12/20/2010   Back pain 12/15/2010   H/O dissecting abdominal aortic aneurysm repair 08/24/2010   IRRITABLE BOWEL SYNDROME 09/09/2009   Obstructive sleep apnea 08/04/2009   External hemorrhoid  06/26/2009   Osteoporosis 01/09/2009   Hyperlipidemia 07/26/2007   Depression with anxiety 07/26/2007   Essential hypertension 07/26/2007   Allergic rhinitis 07/26/2007   Asthma 07/26/2007   GERD 07/26/2007   Osteoarthritis 07/26/2007   Ocular migraine 07/26/2007   Mild intermittent asthma without complication 67/42/5525    Janna Arch, PT, DPT  08/31/2021, 4:28 PM  [place] [facility] SERVICES [address] [place], [place], [address] Phone: [phone]   Fax:  [phone]  Name: Rachel Torres MRN: [id number] Date of Birth: 18-Mar-1956

## 2021-09-01 ENCOUNTER — Ambulatory Visit (INDEPENDENT_AMBULATORY_CARE_PROVIDER_SITE_OTHER): Payer: Medicare HMO | Admitting: Family Medicine

## 2021-09-01 ENCOUNTER — Other Ambulatory Visit (HOSPITAL_COMMUNITY)
Admission: RE | Admit: 2021-09-01 | Discharge: 2021-09-01 | Disposition: A | Discharge status: Home / Self Care | Payer: Medicare HMO | Source: Ambulatory Visit | Attending: Family Medicine | Admitting: Family Medicine

## 2021-09-01 ENCOUNTER — Ambulatory Visit: Payer: Medicare HMO

## 2021-09-01 ENCOUNTER — Encounter: Payer: Self-pay | Admitting: Family Medicine

## 2021-09-01 VITALS — BP 138/74 | HR 80 | Temp 97.7°F | Ht 65.0 in | Wt 175.0 lb

## 2021-09-01 DIAGNOSIS — Z01419 Encounter for gynecological examination (general) (routine) without abnormal findings: Secondary | ICD-10-CM

## 2021-09-01 DIAGNOSIS — Z1151 Encounter for screening for human papillomavirus (HPV): Secondary | ICD-10-CM | POA: Diagnosis not present

## 2021-09-01 DIAGNOSIS — M810 Age-related osteoporosis without current pathological fracture: Secondary | ICD-10-CM

## 2021-09-01 MED ORDER — ZOSTER VAC RECOMB ADJUVANTED 50 MCG/0.5ML IM SUSR: 0.5000 mL | Freq: Once | INTRAMUSCULAR | 0 refills | Status: AC

## 2021-09-01 NOTE — Patient Instructions (Addendum)
For osteoporosis treatment options would include prolia and I sent the   We would not start anything until all your dental work is done  shingrix px to your [facility]   Pap is done, we will notify you   Take care of yourself   Fit in exercise when you can

## 2021-09-01 NOTE — Progress Notes (Signed)
Subjective:    Patient ID: Rachel Torres, female    DOB: [birth year], 65 y.o.   MRN: [id number]  This visit occurred during the SARS-CoV-2 public health emergency.  Safety protocols were in place, including screening questions prior to the visit, additional usage of staff PPE, and extensive cleaning of exam room while observing appropriate contact time as indicated for disinfecting solutions.   HPI Pt presents for routine gyn visit   Wt Readings from Last 3 Encounters:  09/01/21 175 lb (79.4 kg)  08/19/21 176 lb 8 oz (80.1 kg)  08/18/21 171 lb (77.6 kg)   29.12 kg/m  Has gone through 3 therapists so far this year  Unsure if she gels with current one Having a rough time   Her mother fell again/more fractures   Last pap 2017 Had pos HPV but not 16, 18 or 45  No h/o LEEP or colposcopy  Has had HPV positive  No gyn symptoms or problems   Not currently sexually active with husb due to his health   Mammogram 8/22 Self breast exam -no lumps  Did breast exam last time   Dexa 8/22- OP in forearm  Alendronate gave her GI upset  Falls - none Fractures -none Supplements -vit D/had to cut back due to high level 4 days per week Exercise - helping husband get up and down   Has crowns /dental procedures coming up   Still has chronic pain in R shoulder after replacement  Dr Amedeo Plenty  Plans to go back   Interested in shingrix/needs px sent to pharamacy  Patient Active Problem List   Diagnosis Date Noted   Medicare annual wellness visit, initial 07/17/2021   TMJ (dislocation of temporomandibular joint) 07/17/2021   Encounter for screening mammogram for breast cancer 06/05/2021   Diarrhea 04/11/2020   Epigastric pain 02/19/2020   Dark stools 02/19/2020   PAF (paroxysmal atrial fibrillation) ([place]) 02/05/2019   Shortness of breath 01/27/2019   Irregular surface of cornea 12/25/2018   HPV (human papilloma virus) infection 12/01/2016   Screening mammogram, encounter for  11/29/2016   Estrogen deficiency 11/29/2016   Encounter for routine gynecological examination 11/29/2016   Grade III hemorrhoids 10/06/2016   Tachycardia 08/14/2016   Hemorrhoids 08/13/2016   Atypical migraine 08/03/2016   CAD (coronary artery disease)    AAA (abdominal aortic aneurysm) ([place])    Chronic combined systolic (congestive) and diastolic (congestive) heart failure ([place])    Prediabetes 02/11/2016   Generalized anxiety disorder 12/08/2015   Panic attacks 07/28/2015   Sensorineural hearing loss 03/27/2013   Vitamin D deficiency 07/03/2012   H/O aortic dissection 05/12/2012   Routine general medical examination at a health care facility 09/20/2011   S/P AVR (aortic valve replacement) 01/07/2011   CORONARY ARTERY BYPASS GRAFT, HX OF 01/07/2011   Arthritis 12/20/2010   Back pain 12/15/2010   H/O dissecting abdominal aortic aneurysm repair 08/24/2010   IRRITABLE BOWEL SYNDROME 09/09/2009   Obstructive sleep apnea 08/04/2009   External hemorrhoid 06/26/2009   Osteoporosis 01/09/2009   Hyperlipidemia 07/26/2007   Depression with anxiety 07/26/2007   Essential hypertension 07/26/2007   Allergic rhinitis 07/26/2007   Asthma 07/26/2007   GERD 07/26/2007   Osteoarthritis 07/26/2007   Ocular migraine 07/26/2007   Mild intermittent asthma without complication [id number]   Past Medical History:  Diagnosis Date   AAA (abdominal aortic aneurysm) ([place])    a. Chronic w/o evidence of aneurysmal dil on CTA 01/2016.   Alcohol abuse    Allergy  Anemia    Anxiety    Asthma    CAD (coronary artery disease)    a. 08/2010 s/p CABG x 1 (VG->RCA) @ time of Ao dissection repair; b. 01/2016 Lexiscan MV: mid antsept/apical defect w/ ? peri-infarct ischemia-->likely attenuation-->Med Rx.   Chewing difficulty    Chronic combined systolic (congestive) and diastolic (congestive) heart failure ([place])    a. 2012 EF 30-35%; b. 04/2015 EF 40-45%; c. 01/2016 Echo: Ef 55-65%, nl AoV bioprosthesis, nl RV,  nl PASP.   Constipation    Depression    Edema of both lower extremities    Gallbladder sludge    GERD (gastroesophageal reflux disease)    Heart failure (HCC)    Heart valve problem    History of Bicuspid Aortic Valve    a. 08/2010 s/p AVR @ time of Ao dissection repair; b. 01/2016 Echo: Ef 55-65%, nl AoV bioprosthesis, nl RV, nl PASP.   Hx of repair of dissecting thoracic aortic aneurysm, [place] type A    a. 08/2010 s/p repair with AVR and VG->RCA; b. 01/2016 CTA: stable appearance of Asc Thoracic Aortic graft. Opacification of flase lumen of chronic abd Ao dissection w/ retrograde flow through lumbar arteries. No aneurysmal dil of flase lumen.   Hyperlipidemia    Hypertension    Hypertensive heart disease    IBS (irritable bowel syndrome)    Internal hemorrhoids    Kidney problem    Marfan syndrome    pt denies   Migraine    Obstructive sleep apnea    Osteoarthritis    Osteoporosis    Palpitations    Pneumonia    RA (rheumatoid arthritis) ([place])    a. Followed by Dr. [name]   Past Surgical History:  Procedure Laterality Date   ABDOMINAL AORTIC ANEURYSM REPAIR     CARDIAC CATHETERIZATION  2001   [place]     ESOPHAGOGASTRODUODENOSCOPY (EGD) WITH PROPOFOL N/A 05/08/2020   Procedure: ESOPHAGOGASTRODUODENOSCOPY (EGD) WITH PROPOFOL;  Surgeon: Virgel Manifold, MD;  Location: [facility] ENDOSCOPY;  Service: Endoscopy;  Laterality: N/A;   FRACTURE SURGERY Right    shoulder replacement   HEMORRHOID BANDING     ORIF DISTAL RADIUS FRACTURE Left    UPPER GASTROINTESTINAL ENDOSCOPY     Social History   Tobacco Use   Smoking status: Former    Packs/day: 0.75    Years: 1.50    Pack years: 1.13    Types: Cigarettes    Quit date: 12/20/1978    Years since quitting: 42.7   Smokeless tobacco: Never  Vaping Use   Vaping Use: Never used  Substance Use Topics   Alcohol use: Yes    Alcohol/week: 0.0 standard drinks    Comment:  2-3 glasses of red wine per week   Drug use: No   Family History  Problem Relation Age of Onset   Hypertension Mother    Depression Mother    Osteoporosis Mother    Stroke Mother    Irritable bowel syndrome Mother    Asthma Mother    Heart disease Mother    Thyroid disease Mother    Anxiety disorder Mother    Arthritis Sister    Diabetes Sister    Heart disease Sister    Asthma Sister    Migraines Sister    Nephrolithiasis Sister    Osteoporosis Sister    Arthritis Sister    Asthma Sister    Migraines Sister  Other Father 76   Stroke Maternal Grandmother    Colon cancer Maternal Grandmother    Lymphoma Maternal Grandmother    Migraines Maternal Grandmother    Aneurysm Paternal Grandmother        brain   Diabetes Paternal Grandmother    Arthritis Paternal Grandmother    Arthritis Paternal Grandfather    Diabetes Paternal Grandfather    Other Brother        prediabeties   Asthma Brother    Migraines Brother    Lung cancer Maternal Grandfather    Melanoma Maternal Grandfather    Allergies  Allergen Reactions   Fosamax [Alendronate Sodium]     GI upset    Ginzing [Ginseng] Other (See Comments)    Hyper and jitery    Hydrocod Polst-Cpm Polst Er Itching   Hydrocodone-Acetaminophen Itching   Imitrex [Sumatriptan]     Contraindicated w/ heart condition   Oxycodone Itching   Peanut-Containing Drug Products Other (See Comments)    Reaction:  Migraines    Tramadol Other (See Comments)    itching   Hydrocodone-Guaifenesin Itching   Prednisone Rash   Tape Itching   Current Outpatient Medications on File Prior to Visit  Medication Sig Dispense Refill   acetaminophen (TYLENOL) 500 MG tablet Take 500 mg by mouth every 6 (six) hours as needed.     albuterol (VENTOLIN HFA) 108 (90 Base) MCG/ACT inhaler Inhale 1-2 puffs into the lungs every 6 (six) hours as needed for wheezing or shortness of breath. 3 each 3   ALPRAZolam (XANAX) 0.5 MG tablet Take 1 tablet (0.5 mg  total) by mouth daily as needed for anxiety. 30 tablet 0   aspirin EC 81 MG tablet Take 81 mg by mouth at bedtime.     atorvastatin (LIPITOR) 20 MG tablet Take 1 tablet (20 mg total) by mouth daily. Time to schedule your yearly appt with Dr. [name] in Nov. For continued refills 90 tablet 0   carvedilol (COREG) 6.25 MG tablet TAKE 1 TABLET TWICE DAILY WITH A MEAL 180 tablet 1   Cholecalciferol (VITAMIN D3) 50 MCG (2000 UT) CAPS Take 2,000 capsules by mouth.     clindamycin (CLEOCIN) 300 MG capsule      cyclobenzaprine (FLEXERIL) 10 MG tablet Take 0.5-1 tablets (5-10 mg total) by mouth 3 (three) times daily as needed. For headache 90 tablet 1   furosemide (LASIX) 20 MG tablet TAKE 1 TABLET DAILY. MAY TAKE ADDITIONAL TABLET IN AFTERNOON AS NEEDED FOR SWELLING, SHORTNESS OF BREATH, OR WEIGHT GAIN. 180 tablet 0   Insulin Pen Needle (BD PEN NEEDLE NANO U/F) 32G X 4 MM MISC Use Nano needle with Ozempic 100 each 0   Krill Oil (OMEGA-3) 500 MG CAPS Take 1 capsule by mouth daily.     lisinopril (ZESTRIL) 5 MG tablet TAKE 1/2 TABLET EVERY DAY. Time to schedule your yearly appt with Dr. [name] in Nov. For continued refills 45 tablet 1   magnesium gluconate (MAGONATE) 500 MG tablet Take 500 mg by mouth at bedtime.     Melatonin 5 MG SUBL Place under the tongue.     Misc Natural Products (TART CHERRY ADVANCED) CAPS Take 1 capsule by mouth daily.     naproxen sodium (ANAPROX) 220 MG tablet Take 440 mg by mouth 2 (two) times daily as needed (for headaches/pain).     nystatin (MYCOSTATIN/NYSTOP) powder Apply 1 application topically 2 (two) times daily. Apply to affected area twice a day for 14 days 60 g 0  omeprazole (PRILOSEC) 40 MG capsule Take 1 capsule (40 mg total) by mouth daily. 90 capsule 3   polyethylene glycol powder (GLYCOLAX/MIRALAX) 17 GM/SCOOP powder Take 17 g by mouth as needed. 255 g 0   potassium chloride (KLOR-CON) 10 MEQ tablet TAKE 1 TABLET TWICE A DAY AS NEEDED. TAKE ADDITIONAL TABLET WHEN  INCREASING FUROSEMIDE 270 tablet 1   promethazine (PHENERGAN) 25 MG tablet Take 0.5 tablets (12.5 mg total) by mouth every 8 (eight) hours as needed for nausea. 90 tablet 1   Semaglutide, 1 MG/DOSE, 4 MG/3ML SOPN Inject 1 mg as directed once a week. 3 mL 0   Semaglutide,0.25 or 0.'5MG'$ /DOS, (OZEMPIC, 0.25 OR 0.5 MG/DOSE,) 2 MG/1.5ML SOPN Inject 0.5 mg into the skin once a week. 1.5 mL 0   Simethicone (MYLANTA GAS PO) Take by mouth 3 times/day as needed-between meals & bedtime.     venlafaxine XR (EFFEXOR-XR) 150 MG 24 hr capsule TAKE 1 CAPSULE EVERY DAY WITH BREAKFAST 90 capsule 1   No current facility-administered medications on file prior to visit.    Review of Systems  Constitutional:  Negative for activity change, appetite change, fatigue, fever and unexpected weight change.  HENT:  Negative for congestion, ear pain, rhinorrhea, sinus pressure and sore throat.   Eyes:  Negative for pain, redness and visual disturbance.  Respiratory:  Negative for cough, shortness of breath and wheezing.   Cardiovascular:  Negative for chest pain and palpitations.  Gastrointestinal:  Negative for abdominal pain, blood in stool, constipation and diarrhea.  Endocrine: Negative for polydipsia and polyuria.  Genitourinary:  Negative for dysuria, frequency and urgency.  Musculoskeletal:  Positive for arthralgias. Negative for back pain and myalgias.       Shoulder hurts/prev surgery  Skin:  Negative for pallor and rash.  Allergic/Immunologic: Negative for environmental allergies.  Neurological:  Negative for dizziness, syncope and headaches.  Hematological:  Negative for adenopathy. Does not bruise/bleed easily.  Psychiatric/Behavioral:  Negative for decreased concentration and dysphoric mood. The patient is nervous/anxious.        Many stressors      Objective:   Physical Exam Constitutional:      General: She is not in acute distress.    Appearance: Normal appearance. She is normal weight. She is not  ill-appearing.  Eyes:     Conjunctiva/sclera: Conjunctivae normal.     Pupils: Pupils are equal, round, and reactive to light.  Cardiovascular:     Rate and Rhythm: Normal rate and regular rhythm.     Pulses: Normal pulses.  Pulmonary:     Effort: Pulmonary effort is normal. No respiratory distress.     Breath sounds: Normal breath sounds. No wheezing.  Abdominal:     General: Abdomen is flat. There is no distension.     Palpations: Abdomen is soft. There is no mass.     Tenderness: There is no abdominal tenderness.  Genitourinary:    Comments:              Anus appears normal w/o hemorrhoids or masses       External genitalia : nl appearance and hair distribution/no lesions       Urethral meatus : nl size, no lesions or prolapse       Urethra: no masses, tenderness or scarring      Bladder : no masses or tenderness       Vagina: nl general appearance, no discharge or  Lesions, no significant cystocele  or rectocele  Cervix: no lesions/ discharge or friability      Uterus: nl size, contour, position, and mobility (not fixed) , non tender      Adnexa : no masses, tenderness, enlargement or nodularity          Breast exam done at last visit Musculoskeletal:     Cervical back: Neck supple.     Right lower leg: No edema.     Left lower leg: No edema.  Lymphadenopathy:     Cervical: No cervical adenopathy.  Skin:    Findings: No rash.  Neurological:     Mental Status: She is alert.     Motor: No weakness.     Coordination: Coordination normal.  Psychiatric:        Mood and Affect: Mood normal.     Comments: Voices anxiety over stressors           Assessment & Plan:   Problem List Items Addressed This Visit       Musculoskeletal and Integument   Osteoporosis    dexa 8/22 reviewed with OP in forearm In past alendronate gave her GI upset  May be a candidate for reclast or prolia Info given on both  She can read and consider and check with ins  Would not  start anything until dental work is completely done  Enc more exercise  Taking D          Other   Encounter for routine gynecological examination - Primary    Routine exam with pap  H/o HPV on pap but not high risk type (and pap has been nl)  No new partners No c/o   Enc self breast exams      Relevant Orders   Cytology - PAP([place])

## 2021-09-02 NOTE — Assessment & Plan Note (Signed)
dexa 8/22 reviewed with OP in forearm In past alendronate gave her GI upset  May be a candidate for reclast or prolia Info given on both  She can read and consider and check with ins  Would not start anything until dental work is completely done  Enc more exercise  Taking D

## 2021-09-02 NOTE — Assessment & Plan Note (Signed)
Routine exam with pap  H/o HPV on pap but not high risk type (and pap has been nl)  No new partners No c/o   Enc self breast exams

## 2021-09-03 ENCOUNTER — Ambulatory Visit: Payer: Medicare HMO

## 2021-09-04 ENCOUNTER — Telehealth: Payer: Self-pay

## 2021-09-04 LAB — CYTOLOGY - PAP
Comment: NEGATIVE
Diagnosis: NEGATIVE
High risk HPV: NEGATIVE

## 2021-09-04 NOTE — Chronic Care Management (AMB) (Addendum)
Chronic Care Management Pharmacy Assistant   Name: Rachel Torres  MRN: [id number] DOB: 25-Jan-1956  Reason for Encounter: HTN, HLD Review   Recent office visits:  09/01/21-PCP-Patient presented for ob/gyn exam.Option for prolia in the future. Shingrix Rx.   Recent consult visits:  09/09/21-Family Medicine-Weight Management center-Patient to continue diet,weight loss,exercise follow up 4 weeks 08/19/21-Vascular Surgery-Patient presented for follow up aneurysm. CT-scan reviewed ,follow up 3 years.  08/18/21-[facility]-Increased Ozempic to '1mg'$ .weekly.  Hospital visits:  None in previous 6 months  Medications: Outpatient Encounter Medications as of 09/04/2021  Medication Sig   acetaminophen (TYLENOL) 500 MG tablet Take 500 mg by mouth every 6 (six) hours as needed.   albuterol (VENTOLIN HFA) 108 (90 Base) MCG/ACT inhaler Inhale 1-2 puffs into the lungs every 6 (six) hours as needed for wheezing or shortness of breath.   ALPRAZolam (XANAX) 0.5 MG tablet Take 1 tablet (0.5 mg total) by mouth daily as needed for anxiety.   aspirin EC 81 MG tablet Take 81 mg by mouth at bedtime.   atorvastatin (LIPITOR) 20 MG tablet Take 1 tablet (20 mg total) by mouth daily. Time to schedule your yearly appt with Dr. [name] in Nov. For continued refills   carvedilol (COREG) 6.25 MG tablet TAKE 1 TABLET TWICE DAILY WITH A MEAL   Cholecalciferol (VITAMIN D3) 50 MCG (2000 UT) CAPS Take 2,000 capsules by mouth.   clindamycin (CLEOCIN) 300 MG capsule    cyclobenzaprine (FLEXERIL) 10 MG tablet Take 0.5-1 tablets (5-10 mg total) by mouth 3 (three) times daily as needed. For headache   furosemide (LASIX) 20 MG tablet TAKE 1 TABLET DAILY. MAY TAKE ADDITIONAL TABLET IN AFTERNOON AS NEEDED FOR SWELLING, SHORTNESS OF BREATH, OR WEIGHT GAIN.   Insulin Pen Needle (BD PEN NEEDLE NANO U/F) 32G X 4 MM MISC Use Nano needle with Ozempic   Krill Oil (OMEGA-3) 500 MG CAPS Take 1 capsule by mouth daily.    lisinopril (ZESTRIL) 5 MG tablet TAKE 1/2 TABLET EVERY DAY. Time to schedule your yearly appt with Dr. [name] in Nov. For continued refills   magnesium gluconate (MAGONATE) 500 MG tablet Take 500 mg by mouth at bedtime.   Melatonin 5 MG SUBL Place under the tongue.   Misc Natural Products (TART CHERRY ADVANCED) CAPS Take 1 capsule by mouth daily.   naproxen sodium (ANAPROX) 220 MG tablet Take 440 mg by mouth 2 (two) times daily as needed (for headaches/pain).   nystatin (MYCOSTATIN/NYSTOP) powder Apply 1 application topically 2 (two) times daily. Apply to affected area twice a day for 14 days   omeprazole (PRILOSEC) 40 MG capsule Take 1 capsule (40 mg total) by mouth daily.   polyethylene glycol powder (GLYCOLAX/MIRALAX) 17 GM/SCOOP powder Take 17 g by mouth as needed.   potassium chloride (KLOR-CON) 10 MEQ tablet TAKE 1 TABLET TWICE A DAY AS NEEDED. TAKE ADDITIONAL TABLET WHEN INCREASING FUROSEMIDE   promethazine (PHENERGAN) 25 MG tablet Take 0.5 tablets (12.5 mg total) by mouth every 8 (eight) hours as needed for nausea.   Semaglutide, 1 MG/DOSE, 4 MG/3ML SOPN Inject 1 mg as directed once a week.   Semaglutide,0.25 or 0.'5MG'$ /DOS, (OZEMPIC, 0.25 OR 0.5 MG/DOSE,) 2 MG/1.5ML SOPN Inject 0.5 mg into the skin once a week.   Simethicone (MYLANTA GAS PO) Take by mouth 3 times/day as needed-between meals & bedtime.   venlafaxine XR (EFFEXOR-XR) 150 MG 24 hr capsule TAKE 1 CAPSULE EVERY DAY WITH BREAKFAST   No facility-administered encounter medications on file  as of 09/04/2021.    Recent Office Vitals: BP Readings from Last 3 Encounters:  09/01/21 138/74  08/19/21 (!) 138/35  08/18/21 100/63   Pulse Readings from Last 3 Encounters:  09/01/21 80  08/19/21 81  08/18/21 82    Wt Readings from Last 3 Encounters:  09/01/21 175 lb (79.4 kg)  08/19/21 176 lb 8 oz (80.1 kg)  08/18/21 171 lb (77.6 kg)     Kidney Function Lab Results  Component Value Date/Time   CREATININE 0.80 08/13/2021 11:48  AM   CREATININE 0.89 07/10/2021 08:10 AM   CREATININE 0.94 06/20/2013 08:15 AM   CREATININE 0.96 05/12/2012 12:12 PM   CREATININE 0.88 04/09/2011 09:00 AM   GFR 68.85 07/10/2021 08:10 AM   GFRNONAA 68 07/23/2020 03:33 PM   GFRNONAA >60 06/20/2013 08:15 AM   GFRAA 78 07/23/2020 03:33 PM   GFRAA >60 06/20/2013 08:15 AM    BMP Latest Ref Rng & Units 08/13/2021 07/10/2021 02/26/2021  Glucose 70 - 99 mg/dL - 95 97  BUN 6 - 23 mg/dL - 14 16  Creatinine 0.44 - 1.00 mg/dL 0.80 0.89 0.91  BUN/Creat Ratio 12 - 28 - - 18  Sodium 135 - 145 mEq/L - 135 137  Potassium 3.5 - 5.1 mEq/L - 4.8 4.7  Chloride 96 - 112 mEq/L - 99 100  CO2 19 - 32 mEq/L - 30 21  Calcium 8.4 - 10.5 mg/dL - 9.5 9.5      Current antihypertensive regimen:  Carvedilol 6.25 mg - 1 tablet twice daily with meal Lisinopril 5 mg - 1/2 tablet daily   Adherence Review: Is the patient currently on ACE/ARB medication? Yes Does the patient have >5 day gap between last estimated fill dates? CPP to review   Star Rating Drugs:  Medication:  Last Fill: Day Supply Atorvastatin '20mg'$  06/03/21 90 Lisinopril '5mg'$   06/03/21 90 Ozempic '1mg'$   07/09/21 56    Lipid Panel    Component Value Date/Time   CHOL 166 07/10/2021 0810   CHOL 219 (H) 02/26/2021 1355   TRIG 105.0 07/10/2021 0810   TRIG 110 12/29/2006 1251   HDL 55.20 07/10/2021 0810   HDL 64 02/26/2021 1355   LDLCALC 89 07/10/2021 0810   LDLCALC 141 (H) 02/26/2021 1355   LDLDIRECT 189.4 10/13/2011 1054    Attempted contact with Rachel Torres 3 times on 09/04/21,09/08/21,09/09/21. Unsuccessful outreach. Will attempt contact next month.   10-year ASCVD risk score: The 10-year ASCVD risk score (Arnett DK, et al., 2019) is: 11.7%   Values used to calculate the score:     Age: 65 years     Sex: Female     Is Non-Hispanic African American: No     Diabetic: Yes     Tobacco smoker: No     Systolic Blood Pressure: 0000000 mmHg     Is BP treated: Yes     HDL Cholesterol: 55.2  mg/dL     Total Cholesterol: 166 mg/dL  Current antihyperlipidemic regimen:  Krill Oil (OMEGA-3) 500 MG CAPS - 1 capsule daily Atorvastatin (LIPITOR) 20 MG tablet - 1 tablet daily    Previous antihyperlipidemic medications tried: none  ASCVD risk enhancing conditions:  Pre-DM and HTN  Annual wellness visit in last year? 07/17/21 Most Recent BP reading:138/74  80-P (09/01/21)  If Diabetic: (Pre-diabetes) Most recent A1C reading:  5.6 (07/10/21) Last eye exam / retinopathy screening: UTD per chart 07/17/21 Last diabetic foot exam:  n/a  No appointments scheduled within the next 30 days.  Rehab visits scheduled.  Star Rating Drugs Medication Name/Dose: Last fill date Days supply  Atorvastatin '20mg'$  06/03/21 90 Lisinopril '5mg'$   06/03/21 90 Ozempic '1mg'$   07/09/21 [place], CPP notified  Avel Sensor, [place] Assistant [phone]  I have reviewed the care management and care coordination activities outlined in this encounter and I am certifying that I agree with the content of this note. No further action required.  Debbora Dus, PharmD Clinical Pharmacist [facility] [phone]

## 2021-09-07 ENCOUNTER — Ambulatory Visit: Payer: Medicare HMO

## 2021-09-07 ENCOUNTER — Other Ambulatory Visit: Payer: Self-pay

## 2021-09-07 DIAGNOSIS — M25511 Pain in right shoulder: Secondary | ICD-10-CM

## 2021-09-07 DIAGNOSIS — R2689 Other abnormalities of gait and mobility: Secondary | ICD-10-CM

## 2021-09-07 DIAGNOSIS — R293 Abnormal posture: Secondary | ICD-10-CM

## 2021-09-07 DIAGNOSIS — M542 Cervicalgia: Secondary | ICD-10-CM | POA: Diagnosis not present

## 2021-09-07 DIAGNOSIS — R278 Other lack of coordination: Secondary | ICD-10-CM

## 2021-09-07 DIAGNOSIS — G8929 Other chronic pain: Secondary | ICD-10-CM | POA: Diagnosis not present

## 2021-09-07 DIAGNOSIS — M5412 Radiculopathy, cervical region: Secondary | ICD-10-CM

## 2021-09-07 DIAGNOSIS — M6281 Muscle weakness (generalized): Secondary | ICD-10-CM | POA: Diagnosis not present

## 2021-09-07 DIAGNOSIS — M545 Low back pain, unspecified: Secondary | ICD-10-CM

## 2021-09-07 DIAGNOSIS — M546 Pain in thoracic spine: Secondary | ICD-10-CM | POA: Diagnosis not present

## 2021-09-07 NOTE — Therapy (Signed)
[place] MAIN [facility] SERVICES [address] [place], [place], [address] Phone: [phone]   Fax:  [phone]  Physical Therapy Treatment/Reassessment   Patient Details  Name: Rachel Torres MRN: [id number] Date of Birth: [birth year] Referring Provider (PT): Gregor Hams, MD   Encounter Date: 09/07/2021   PT End of Session - 09/07/21 1423     Visit Number 35    Number of Visits 53    Date for PT Re-Evaluation 10/21/21    PT Start Time 6962    PT Stop Time 1425    PT Time Calculation (min) 40 min    Activity Tolerance Patient tolerated treatment well;No increased pain    Behavior During Therapy WFL for tasks assessed/performed             Past Medical History:  Diagnosis Date   AAA (abdominal aortic aneurysm) ([place])    a. Chronic w/o evidence of aneurysmal dil on CTA 01/2016.   Alcohol abuse    Allergy    Anemia    Anxiety    Asthma    CAD (coronary artery disease)    a. 08/2010 s/p CABG x 1 (VG->RCA) @ time of Ao dissection repair; b. 01/2016 Lexiscan MV: mid antsept/apical defect w/ ? peri-infarct ischemia-->likely attenuation-->Med Rx.   Chewing difficulty    Chronic combined systolic (congestive) and diastolic (congestive) heart failure ([place])    a. 2012 EF 30-35%; b. 04/2015 EF 40-45%; c. 01/2016 Echo: Ef 55-65%, nl AoV bioprosthesis, nl RV, nl PASP.   Constipation    Depression    Edema of both lower extremities    Gallbladder sludge    GERD (gastroesophageal reflux disease)    Heart failure (HCC)    Heart valve problem    History of Bicuspid Aortic Valve    a. 08/2010 s/p AVR @ time of Ao dissection repair; b. 01/2016 Echo: Ef 55-65%, nl AoV bioprosthesis, nl RV, nl PASP.   Hx of repair of dissecting thoracic aortic aneurysm, [place] type A    a. 08/2010 s/p repair with AVR and VG->RCA; b. 01/2016 CTA: stable appearance of Asc Thoracic Aortic graft. Opacification of flase lumen of chronic abd Ao dissection w/ retrograde  flow through lumbar arteries. No aneurysmal dil of flase lumen.   Hyperlipidemia    Hypertension    Hypertensive heart disease    IBS (irritable bowel syndrome)    Internal hemorrhoids    Kidney problem    Marfan syndrome    pt denies   Migraine    Obstructive sleep apnea    Osteoarthritis    Osteoporosis    Palpitations    Pneumonia    RA (rheumatoid arthritis) ([place])    a. Followed by Dr. [name]    Past Surgical History:  Procedure Laterality Date   ABDOMINAL AORTIC ANEURYSM REPAIR     CARDIAC CATHETERIZATION  2001   [place]     ESOPHAGOGASTRODUODENOSCOPY (EGD) WITH PROPOFOL N/A 05/08/2020   Procedure: ESOPHAGOGASTRODUODENOSCOPY (EGD) WITH PROPOFOL;  Surgeon: Virgel Manifold, MD;  Location: [facility] ENDOSCOPY;  Service: Endoscopy;  Laterality: N/A;   FRACTURE SURGERY Right    shoulder replacement   HEMORRHOID BANDING     ORIF DISTAL RADIUS FRACTURE Left    UPPER GASTROINTESTINAL ENDOSCOPY      There were no vitals filed for this visit.   Subjective Assessment - 09/07/21 1349     Subjective Pt reports  undergoing 3 crowns on 9/15 2 on Rt top molars, 1 on Rt top molar. Pt has exacerbation of Rt jaw pain since procedure and during, has been manaing pain well at home with ice, tylenol. Pt denies any further exacerbation of clicking phenomenon, denies any locking of jaw since procedure. HEP going well thus far.    Pertinent History Pt is a 65 y/o female presenting to therapy due to R shoulder pain. Pt had R shoulder replacement 12-13 years ago.  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 She says shoulder pain started this past January as she was helping lift her husband and his assistive devices. She reports she has since stopped lifting him and his walker as they have caregivers who come to the home for 16 hours/week and can help. Pt is most concerned that her shoulder hurts when  she drives and washes her hair. Her shoulder does not hurt constantly, but she states she has pain "the more I use it." She feels a catch and stabbing sensation in R anterior shoulder that can travel into bicep and that sometimes the pain is a burning sensation. She sometimes has pain in L shoulder.  She rates her worst R shoulder pain as 10/10, best pain is 0/10. Her R shoulder pain is currently 1.5/10. She reports that rest, ice and naproxen help with pain relief. She denies redness/swelling/temp difference and N/T in UEs.  Patient has been seen by other therapists in this clinic previously for cervicalgia and back related injuries/pain. Pt PMH includes COVID19, upper GI stomach issues, aorta type A dissection (2011) with aortic valve replacement, bypass of R coronary artery, HTN, hyperlipidemia, pre-diabetes, AFIB, CHF, depression, anxiety, OA, GERD, atypical migraine, asthma, and allergic rhinitis. Additional diagnosis of TMJ has been added with patient reporting pain has been occuring for ~40 years, worsens with stress and grinding of teeth.    Currently in Pain? Yes    Pain Score 3     Pain Location --   Right jaw area, between upper lower teeth, ~premolar zone.               [facility] PT Assessment - 09/07/21 0001       ROM / Strength   AROM / PROM / Strength PROM;AROM      AROM   Overall AROM Comments Protrusion 23m, Retraction 1-226m   AROM Assessment Site Jaw;Cervical    Cervical Flexion WNL   chin to sternum   Cervical Extension 60    Cervical - Right Side Bend 27 degrees    Cervical - Left Side Bend 30 degrees    Cervical - Right Rotation 60    Cervical - Left Rotation 64    Jaw-Incisal Opening  3051m  Jaw-Left Lateral Excurison 36m96m Jaw-Right Lateral Excursion 36mm69m  PROM   PROM Assessment Site Cervical    Cervical - Right Rotation --   Capital rotation: 40 (tighness at Rt ceRyder Systemrla articular pillar)   Cervical - Left Rotation --   Capital rotation: 65            INTERVENTION THIS DATE:  -Seated cervical rotation A/ROM 10x bilat, extension/flexion 10x each -ROM assessment (cervical) -ROM assessmnet TMJ -ROM upper cervical  -MFR to Rt cervical gutter muscles, Rt upper trap at lateral neck, Rt masseter 10 minutes total  -Active Release Techniques Rt upper trap x5 minutes   -Cervical retraction into  2 pillows to extension neutral 10x5secH   -Seated capital flexion moblization 3x30sec bilat   -reviewed education on jaw opening ROM at home                           PT Short Term Goals - 07/29/21 1445       PT SHORT TERM GOAL #1   Title Patient will be independent in home exercise program to improve strength/mobility for better functional independence with ADLs.    Baseline 02/24/2021 Pt issued HEP; 5/19: HEP to be advanced, pt has been unable to perform HEP consistently d/t spouse surgery and son's wedding.; 7/7: pt reports she has not been performing HEP "as often as I'm supposed to." 8/10: HEP compliant    Time 6    Period Weeks    Status Achieved    Target Date 08/06/21               PT Long Term Goals - 08/17/21 1522       PT LONG TERM GOAL #1   Title Patient will report a worst pain of 3/10  in last 7 days in her R shoulder to improve tolerance with ADLs and reduced symptoms with activities.    Baseline 02/24/2021: Pt reports worst pain as 10/10.; 5/18: 4/10 is worst pain; 7/7: pt reports worst pain 5/10 8/10: 7/10 pain 8/29: 7/10    Time 12    Period Weeks    Status Not Met      PT LONG TERM GOAL #2   Title Patient will decrease Quick DASH scores by > 8 points demonstrating reduced self-reported upper extremity disability.    Baseline 02/24/2021: QuickDash Disability/symptom score 40, Work score 37.5; 5/18: Disability/symtpom 38.6, Work 43.75; 7/7: disability/sx score 50%, work 62.5% 8/10: 37.7% caregiver work 37.5% 8/29: 52.3%, caregiving/work 56%    Time 12    Period Weeks    Status Not Met      PT  LONG TERM GOAL #3   Title Patient will increase FOTO score to equal to or greater than 60 to demonstrate statistically significant improvement in mobility and quality of life.    Baseline 02/24/2021: 55; 5/18: 60; 7/7: 56 8/10: 56% 8/29: 52%    Time 12    Period Weeks    Status Not Met      PT LONG TERM GOAL #4   Title Patient will increase BUE MMT scores by at least 1/2 point as to improve functional strength and endurance for overhead activities and ADLs such as driving and washing her hair.    Baseline 02/24/2021: L/R shoulder flexion 4/5, 3+/5; shoulder abduction 4/5 B; shoulder extension 4+5 B; shoulder IR/ER both grossly 4/5 B; elbow flexion/extension 5/5 for both B; wrist flexion/extension 5/5 for both B; 5/18: L shoulder is grossly 4+/5.  R shoulder flexion 3+/5; R shoulder abduction 4-/5; R shoulder IR/ER both 4-/5 (B); R elbow flexion 4/5, elbow ext. 5/5 B, shoulder ext. 5/5 B; 7/7:  L shoulder grossly 4+/5, R shoulder 4/5 except shoulder flexion 4-/5, R shoulder IR/ER pain limited to 3/5 8/10: grossly 4/5 with IR/ER painful limiting full range 8/29: 4/5    Time 12    Period Weeks    Status Not Met      PT LONG TERM GOAL #5   Title Pt will demonstrate decrease in NDI by at least 19% in order to demonstrate clinically significant reduction in disability related to neck/TMJ injury/pain  Baseline 8/3: 22%    Time 12    Period Weeks    Status New      PT LONG TERM GOAL #6   Title Patient will report decreased worst VAS of jaw to <3/10 for decreased pain with eating and improved quality of life    Baseline 8/3: 6/10    Time 12    Period Weeks    Status New      PT LONG TERM GOAL #7   Title Patient will report little to no clicking/catching with chewing for improved quality of life and ADL performance    Baseline 8/3: audible clicking/catching    Time 12    Period Weeks    Status New                   Plan - 09/07/21 1428     Clinical Impression Statement   Reassessment this date noted return of symmetry with jaw opening, lateral jaw excursion WNL bilat, pain this date appears to be largely muscular in nature, made worse by recent dental work. Neck ROM also improved, however capital rotation to right speaks well to pain and muscle tightness in Right side cervical extensors. Session tolerated well in general no exacerbation of pain.    Personal Factors and Comorbidities Comorbidity 1;Comorbidity 2;Comorbidity 3+;Past/Current Experience;Social Background;Time since onset of injury/illness/exacerbation;Transportation;Fitness;Other    Comorbidities Pertinent: recent hx of COVID19, HTN, hyperlipidemia, pre-diabetes, AFIB, CHF, depression, anxiety, OA, atypical migraine, asthma.    Examination-Activity Limitations Bathing;Bed Mobility;Hygiene/Grooming;Lift;Caring for Others;Reach Overhead;Carry;Dressing;Sleep;Other;Self Feeding    Examination-Participation Restrictions Laundry;Cleaning;Driving;Community Activity;Other;Yard Work;Shop;Meal Prep;Volunteer    Stability/Clinical Decision Making Stable/Uncomplicated    Clinical Decision Making Low    Rehab Potential Good    PT Frequency 2x / week    PT Duration 12 weeks    PT Treatment/Interventions ADLs/Self Care Home Management;Biofeedback;Cryotherapy;Electrical Stimulation;Moist Heat;Traction;Ultrasound;DME Instruction;Functional mobility training;Therapeutic activities;Therapeutic exercise;Neuromuscular re-education;Patient/family education;Orthotic Fit/Training;Manual techniques;Scar mobilization;Passive range of motion;Dry needling;Taping;Energy conservation;Joint Manipulations;Spinal Manipulations;Gait training;Visual/perceptual remediation/compensation    PT Next Visit Plan continue with TMJ treatment    PT Home Exercise Plan Issued HEP with handout: RTB rows, RTB shoulder ER pull-aparts, and upper trap stretch; shoulder perturbation exercises, ROM for shoulder IR within pain-free range,  mobilizations/distraction to R shoulder, continue training correct lifting mechanics in order to reduce strain on RUE, lifting mechanics    Consulted and Agree with Plan of Care Patient             Patient will benefit from skilled therapeutic intervention in order to improve the following deficits and impairments:  Increased fascial restricitons, Improper body mechanics, Pain, Decreased mobility, Increased muscle spasms, Postural dysfunction, Decreased activity tolerance, Decreased endurance, Decreased range of motion, Decreased strength, Hypomobility, Impaired UE functional use, Impaired flexibility, Decreased coordination, Impaired tone  Visit Diagnosis: Muscle weakness (generalized)  Cervicalgia  Abnormal posture  Chronic right shoulder pain  Other abnormalities of gait and mobility  Other lack of coordination  Pain in thoracic spine  Radiculopathy, cervical region  Chronic low back pain, unspecified back pain laterality, unspecified whether sciatica present  Chronic midline low back pain, unspecified whether sciatica present     Problem List Patient Active Problem List   Diagnosis Date Noted   Medicare annual wellness visit, initial 07/17/2021   TMJ (dislocation of temporomandibular joint) 07/17/2021   Encounter for screening mammogram for breast cancer 06/05/2021   Diarrhea 04/11/2020   Epigastric pain 02/19/2020   Dark stools 02/19/2020   PAF (paroxysmal atrial fibrillation) (HCC) 02/05/2019  Shortness of breath 01/27/2019   Irregular surface of cornea 12/25/2018   HPV (human papilloma virus) infection 12/01/2016   Screening mammogram, encounter for 11/29/2016   Estrogen deficiency 11/29/2016   Encounter for routine gynecological examination 11/29/2016   Grade III hemorrhoids 10/06/2016   Tachycardia 08/14/2016   Hemorrhoids 08/13/2016   Atypical migraine 08/03/2016   CAD (coronary artery disease)    AAA (abdominal aortic aneurysm) (HCC)    Chronic  combined systolic (congestive) and diastolic (congestive) heart failure ([place])    Prediabetes 02/11/2016   Generalized anxiety disorder 12/08/2015   Panic attacks 07/28/2015   Sensorineural hearing loss 03/27/2013   Vitamin D deficiency 07/03/2012   H/O aortic dissection 05/12/2012   Routine general medical examination at a health care facility 09/20/2011   S/P AVR (aortic valve replacement) 01/07/2011   CORONARY ARTERY BYPASS GRAFT, HX OF 01/07/2011   Arthritis 12/20/2010   Back pain 12/15/2010   H/O dissecting abdominal aortic aneurysm repair 08/24/2010   IRRITABLE BOWEL SYNDROME 09/09/2009   Obstructive sleep apnea 08/04/2009   External hemorrhoid 06/26/2009   Osteoporosis 01/09/2009   Hyperlipidemia 07/26/2007   Depression with anxiety 07/26/2007   Essential hypertension 07/26/2007   Allergic rhinitis 07/26/2007   Asthma 07/26/2007   GERD 07/26/2007   Osteoarthritis 07/26/2007   Ocular migraine 07/26/2007   Mild intermittent asthma without complication 03/50/0938   2:35 PM, 09/07/21 Etta Grandchild, PT, DPT Physical Therapist - [facility]  Outpatient Physical Therapy- [place] [phone]     [place], PT 09/07/2021, 2:30 PM  [place] MAIN [facility] SERVICES [address] [place], [place], [address] Phone: [phone]   Fax:  [phone]  Name: Rachel Torres MRN: [id number] Date of Birth: July 18, 1956

## 2021-09-08 ENCOUNTER — Ambulatory Visit: Payer: Medicare HMO

## 2021-09-09 ENCOUNTER — Encounter (INDEPENDENT_AMBULATORY_CARE_PROVIDER_SITE_OTHER): Payer: Self-pay | Admitting: Family Medicine

## 2021-09-09 ENCOUNTER — Ambulatory Visit (INDEPENDENT_AMBULATORY_CARE_PROVIDER_SITE_OTHER): Payer: Medicare HMO | Admitting: Family Medicine

## 2021-09-09 ENCOUNTER — Other Ambulatory Visit: Payer: Self-pay

## 2021-09-09 VITALS — BP 111/66 | HR 75 | Temp 97.6°F | Ht 65.0 in | Wt 168.0 lb

## 2021-09-09 DIAGNOSIS — R7303 Prediabetes: Secondary | ICD-10-CM | POA: Diagnosis not present

## 2021-09-09 DIAGNOSIS — Z6835 Body mass index (BMI) 35.0-35.9, adult: Secondary | ICD-10-CM

## 2021-09-09 DIAGNOSIS — F439 Reaction to severe stress, unspecified: Secondary | ICD-10-CM | POA: Diagnosis not present

## 2021-09-09 MED ORDER — SEMAGLUTIDE (1 MG/DOSE) 4 MG/3ML ~~LOC~~ SOPN: 1.0000 mg | PEN_INJECTOR | SUBCUTANEOUS | 0 refills | Status: DC

## 2021-09-10 ENCOUNTER — Ambulatory Visit: Payer: Medicare HMO

## 2021-09-10 NOTE — Progress Notes (Signed)
Chief Complaint:   OBESITY Rachel Torres is here to discuss her progress with her obesity treatment plan along with follow-up of her obesity related diagnoses. Rachel Torres is on following a lower carbohydrate, vegetable and lean protein rich diet plan and states she is following her eating plan approximately 90% of the time. Rachel Torres states she is doing 0 minutes 0 times per week.  Today's visit was #: 57 Starting weight: 213 lbs Starting date: 05/05/2020 Today's weight: 168 lbs Today's date: 09/09/2021 Total lbs lost to date: 45 Total lbs lost since last in-office visit: 3  Interim History: Rachel Torres continues to do very well with weight loss on her Lower carbohydrate plan. Her hunger is well controlled for the most part.  Subjective:   1. Pre-diabetes [name] started Ozempic and she notes mild GI upset initially but this has improved. She notes decreased polyphagia.  2. Stress Rachel Torres is dealing with a lot of stress in her life and she knows that contributes to cortisol and visceral adiposity.   Assessment/Plan:   1. Pre-diabetes Rachel Torres will continue to work on weight loss, exercise, and decreasing simple carbohydrates to help decrease the risk of diabetes. We will refill Ozempic for 1 month.  - Semaglutide, 1 MG/DOSE, 4 MG/3ML SOPN; Inject 1 mg as directed once a week.  Dispense: 3 mL; Refill: 0  2. Stress Rachel Torres will continue with diet, exercise, and weight loss, and we will follow up at her next visit.  3. Obesity with current BMI 28.0 Rachel Torres is currently in the action stage of change. As such, her goal is to continue with weight loss efforts. She has agreed to following a lower carbohydrate, vegetable and lean protein rich diet plan.   Behavioral modification strategies: decreasing simple carbohydrates and meal planning and cooking strategies.  Rachel Torres has agreed to follow-up with our clinic in 4 weeks. She was informed of the importance of frequent follow-up visits to  maximize her success with intensive lifestyle modifications for her multiple health conditions.   Objective:   Blood pressure 111/66, pulse 75, temperature 97.6 F (36.4 C), height 5\' 5"  (1.651 m), weight 168 lb (76.2 kg), SpO2 97 %. Body mass index is 27.96 kg/m.  General: Cooperative, alert, well developed, in no acute distress. HEENT: Conjunctivae and lids unremarkable. Cardiovascular: Regular rhythm.  Lungs: Normal work of breathing. Neurologic: No focal deficits.   Lab Results  Component Value Date   CREATININE 0.80 08/13/2021   BUN 14 07/10/2021   NA 135 07/10/2021   K 4.8 07/10/2021   CL 99 07/10/2021   CO2 30 07/10/2021   Lab Results  Component Value Date   ALT 15 07/10/2021   AST 16 07/10/2021   ALKPHOS 62 07/10/2021   BILITOT 0.6 07/10/2021   Lab Results  Component Value Date   HGBA1C 5.6 07/10/2021   HGBA1C 5.5 02/26/2021   HGBA1C 5.7 (H) 10/29/2020   HGBA1C 5.6 07/23/2020   HGBA1C 5.9 (H) 05/05/2020   Lab Results  Component Value Date   INSULIN 17.3 02/26/2021   INSULIN 13.9 10/29/2020   INSULIN 22.5 07/23/2020   INSULIN 25.2 (H) 05/05/2020   Lab Results  Component Value Date   TSH 1.54 07/10/2021   Lab Results  Component Value Date   CHOL 166 07/10/2021   HDL 55.20 07/10/2021   LDLCALC 89 07/10/2021   LDLDIRECT 189.4 10/13/2011   TRIG 105.0 07/10/2021   CHOLHDL 3 07/10/2021   Lab Results  Component Value Date   VD25OH 74.87 07/10/2021  VD25OH 62.2 02/26/2021   VD25OH 59.5 10/29/2020   Lab Results  Component Value Date   WBC 5.6 07/10/2021   HGB 13.9 07/10/2021   HCT 41.3 07/10/2021   MCV 96.2 07/10/2021   PLT 274.0 07/10/2021   Lab Results  Component Value Date   FERRITIN 105.0 12/11/2010    Obesity Behavioral Intervention:   Approximately 15 minutes were spent on the discussion below.  ASK: We discussed the diagnosis of obesity with Rachel Torres today and Rachel Torres agreed to give [place] permission to discuss obesity behavioral  modification therapy today.  ASSESS: Rachel Torres has the diagnosis of obesity and her BMI today is 28.0. Rachel Torres is in the action stage of change.   ADVISE: Rachel Torres was educated on the multiple health risks of obesity as well as the benefit of weight loss to improve her health. She was advised of the need for long term treatment and the importance of lifestyle modifications to improve her current health and to decrease her risk of future health problems.  AGREE: Multiple dietary modification options and treatment options were discussed and Rachel Torres agreed to follow the recommendations documented in the above note.  ARRANGE: Rachel Torres was educated on the importance of frequent visits to treat obesity as outlined per CMS and USPSTF guidelines and agreed to schedule her next follow up appointment today.  Attestation Statements:   Reviewed by clinician on day of visit: allergies, medications, problem list, medical history, surgical history, family history, social history, and previous encounter notes.   I, Trixie Dredge, am acting as transcriptionist for [name], MD.  I have reviewed the above documentation for accuracy and completeness, and I agree with the above. -  [name], MD

## 2021-09-14 ENCOUNTER — Ambulatory Visit: Payer: Medicare HMO

## 2021-09-16 ENCOUNTER — Other Ambulatory Visit: Payer: Self-pay

## 2021-09-16 ENCOUNTER — Ambulatory Visit: Payer: Medicare HMO

## 2021-09-16 DIAGNOSIS — M546 Pain in thoracic spine: Secondary | ICD-10-CM | POA: Diagnosis not present

## 2021-09-16 DIAGNOSIS — R293 Abnormal posture: Secondary | ICD-10-CM

## 2021-09-16 DIAGNOSIS — G8929 Other chronic pain: Secondary | ICD-10-CM

## 2021-09-16 DIAGNOSIS — R2689 Other abnormalities of gait and mobility: Secondary | ICD-10-CM | POA: Diagnosis not present

## 2021-09-16 DIAGNOSIS — M25511 Pain in right shoulder: Secondary | ICD-10-CM | POA: Diagnosis not present

## 2021-09-16 DIAGNOSIS — M6281 Muscle weakness (generalized): Secondary | ICD-10-CM | POA: Diagnosis not present

## 2021-09-16 DIAGNOSIS — M542 Cervicalgia: Secondary | ICD-10-CM

## 2021-09-16 DIAGNOSIS — M5412 Radiculopathy, cervical region: Secondary | ICD-10-CM | POA: Diagnosis not present

## 2021-09-16 DIAGNOSIS — R278 Other lack of coordination: Secondary | ICD-10-CM

## 2021-09-16 NOTE — Therapy (Signed)
[place] MAIN [facility] SERVICES [address] [place], [place], [address] Phone: [phone]   Fax:  [phone]  Physical Therapy Treatment  Patient Details  Name: Rachel Torres MRN: [id number] Date of Birth: [birth year] Referring Provider (PT): Gregor Hams, MD   Encounter Date: 09/16/2021   PT End of Session - 09/16/21 1534     Visit Number 36    Number of Visits 21    Date for PT Re-Evaluation 10/21/21    Authorization Type [employer] Primary; [employer] Medicare Secondary    Authorization Time Period 07/29/21-10/21/21    Progress Note Due on Visit 40    PT Start Time 1433    PT Stop Time 1520    PT Time Calculation (min) 47 min    Activity Tolerance Patient tolerated treatment well;No increased pain    Behavior During Therapy WFL for tasks assessed/performed             Past Medical History:  Diagnosis Date   AAA (abdominal aortic aneurysm) ([place])    a. Chronic w/o evidence of aneurysmal dil on CTA 01/2016.   Alcohol abuse    Allergy    Anemia    Anxiety    Asthma    CAD (coronary artery disease)    a. 08/2010 s/p CABG x 1 (VG->RCA) @ time of Ao dissection repair; b. 01/2016 Lexiscan MV: mid antsept/apical defect w/ ? peri-infarct ischemia-->likely attenuation-->Med Rx.   Chewing difficulty    Chronic combined systolic (congestive) and diastolic (congestive) heart failure ([place])    a. 2012 EF 30-35%; b. 04/2015 EF 40-45%; c. 01/2016 Echo: Ef 55-65%, nl AoV bioprosthesis, nl RV, nl PASP.   Constipation    Depression    Edema of both lower extremities    Gallbladder sludge    GERD (gastroesophageal reflux disease)    Heart failure (HCC)    Heart valve problem    History of Bicuspid Aortic Valve    a. 08/2010 s/p AVR @ time of Ao dissection repair; b. 01/2016 Echo: Ef 55-65%, nl AoV bioprosthesis, nl RV, nl PASP.   Hx of repair of dissecting thoracic aortic aneurysm, [place] type A    a. 08/2010 s/p repair with AVR and  VG->RCA; b. 01/2016 CTA: stable appearance of Asc Thoracic Aortic graft. Opacification of flase lumen of chronic abd Ao dissection w/ retrograde flow through lumbar arteries. No aneurysmal dil of flase lumen.   Hyperlipidemia    Hypertension    Hypertensive heart disease    IBS (irritable bowel syndrome)    Internal hemorrhoids    Kidney problem    Marfan syndrome    pt denies   Migraine    Obstructive sleep apnea    Osteoarthritis    Osteoporosis    Palpitations    Pneumonia    RA (rheumatoid arthritis) ([place])    a. Followed by Dr. [name]    Past Surgical History:  Procedure Laterality Date   ABDOMINAL AORTIC ANEURYSM REPAIR     CARDIAC CATHETERIZATION  2001   [place]     ESOPHAGOGASTRODUODENOSCOPY (EGD) WITH PROPOFOL N/A 05/08/2020   Procedure: ESOPHAGOGASTRODUODENOSCOPY (EGD) WITH PROPOFOL;  Surgeon: Virgel Manifold, MD;  Location: [facility] ENDOSCOPY;  Service: Endoscopy;  Laterality: N/A;   FRACTURE SURGERY Right    shoulder replacement   HEMORRHOID BANDING     ORIF DISTAL RADIUS FRACTURE Left    UPPER GASTROINTESTINAL ENDOSCOPY  There were no vitals filed for this visit.   Subjective Assessment - 09/16/21 1526     Subjective Pt reports 3/10 bilat jaw pain today, Rt>Lt near angle of manible bilat. Her upper molar pain has gradually reduced as she has gotten farther out from her first dental crown procedures. She goes to be fit with permanent crowns tomorrow (3 on right side). Pt denies any other updates or medical changes. Pt has been working on her jaw/neck HEP.    Pertinent History Pt is a 65 y/o female presenting to therapy due to R shoulder pain. Pt had R shoulder replacement 12-13 years ago.  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 She says shoulder pain started this past January as she was helping lift her husband and his assistive devices. She reports she has  since stopped lifting him and his walker as they have caregivers who come to the home for 16 hours/week and can help. Pt is most concerned that her shoulder hurts when she drives and washes her hair. Her shoulder does not hurt constantly, but she states she has pain "the more I use it." She feels a catch and stabbing sensation in R anterior shoulder that can travel into bicep and that sometimes the pain is a burning sensation. She sometimes has pain in L shoulder.  She rates her worst R shoulder pain as 10/10, best pain is 0/10. Her R shoulder pain is currently 1.5/10. She reports that rest, ice and naproxen help with pain relief. She denies redness/swelling/temp difference and N/T in UEs.  Patient has been seen by other therapists in this clinic previously for cervicalgia and back related injuries/pain. Pt PMH includes COVID19, upper GI stomach issues, aorta type A dissection (2011) with aortic valve replacement, bypass of R coronary artery, HTN, hyperlipidemia, pre-diabetes, AFIB, CHF, depression, anxiety, OA, GERD, atypical migraine, asthma, and allergic rhinitis. Additional diagnosis of TMJ has been added with patient reporting pain has been occuring for ~40 years, worsens with stress and grinding of teeth.    Currently in Pain? Yes    Pain Score 3    bilat jaw, posterior mandible                 [facility] Adult PT Treatment/Exercise - 09/16/21 0001       Exercises   Exercises Neck      Neck Exercises: Standing   Other Standing Exercises standing shrug, 50% elevation, 4lb FW each hand, neutral head,neck    Other Standing Exercises posterior shoulder circles x10   Pt reports slight increased tightness in neck, then exercies DC     Manual Therapy   Manual Therapy Joint mobilization;Myofascial release;Soft tissue mobilization    Joint Mobilization A/ROM bilat TMJ post TPDN and myofascial release    Soft tissue mobilization Active Release Techniques to Rt upper trap with Rt shoulder flexion 90  to 0, cervical rot 0 to 45* left, left cervical lat flexion to 30    Myofascial Release Sustained release stratetch 4-6 minutes x2 taut bands on Rt masseter, x2 band on Lt masseter              Trigger Point Dry Needling - 09/16/21 0001     Consent Given? Yes    Education Handout Provided Previously provided    Muscles Treated Head and Neck Masseter    Masseter Response Twitch reponse elicited;Palpable increased muscle length  Utilized monofilaments of 17mm x 0.33mm size for treatment this session. No bleeding or bruising seen during procedure. No abnormal pt response to procedure.      PT Education - 09/16/21 1534     Education Details referral patterns associated with Upper Trapezius    Person(s) Educated Patient    Methods Explanation;Demonstration    Comprehension Verbalized understanding;Returned demonstration;Verbal cues required              PT Short Term Goals - 07/29/21 1445       PT SHORT TERM GOAL #1   Title Patient will be independent in home exercise program to improve strength/mobility for better functional independence with ADLs.    Baseline 02/24/2021 Pt issued HEP; 5/19: HEP to be advanced, pt has been unable to perform HEP consistently d/t spouse surgery and son's wedding.; 7/7: pt reports she has not been performing HEP "as often as I'm supposed to." 8/10: HEP compliant    Time 6    Period Weeks    Status Achieved    Target Date 08/06/21               PT Long Term Goals - 08/17/21 1522       PT LONG TERM GOAL #1   Title Patient will report a worst pain of 3/10  in last 7 days in her R shoulder to improve tolerance with ADLs and reduced symptoms with activities.    Baseline 02/24/2021: Pt reports worst pain as 10/10.; 5/18: 4/10 is worst pain; 7/7: pt reports worst pain 5/10 8/10: 7/10 pain 8/29: 7/10    Time 12    Period Weeks    Status Not Met      PT LONG TERM GOAL #2   Title Patient will decrease Quick DASH scores by >  8 points demonstrating reduced self-reported upper extremity disability.    Baseline 02/24/2021: QuickDash Disability/symptom score 40, Work score 37.5; 5/18: Disability/symtpom 38.6, Work 43.75; 7/7: disability/sx score 50%, work 62.5% 8/10: 37.7% caregiver work 37.5% 8/29: 52.3%, caregiving/work 56%    Time 12    Period Weeks    Status Not Met      PT LONG TERM GOAL #3   Title Patient will increase FOTO score to equal to or greater than 60 to demonstrate statistically significant improvement in mobility and quality of life.    Baseline 02/24/2021: 55; 5/18: 60; 7/7: 56 8/10: 56% 8/29: 52%    Time 12    Period Weeks    Status Not Met      PT LONG TERM GOAL #4   Title Patient will increase BUE MMT scores by at least 1/2 point as to improve functional strength and endurance for overhead activities and ADLs such as driving and washing her hair.    Baseline 02/24/2021: L/R shoulder flexion 4/5, 3+/5; shoulder abduction 4/5 B; shoulder extension 4+5 B; shoulder IR/ER both grossly 4/5 B; elbow flexion/extension 5/5 for both B; wrist flexion/extension 5/5 for both B; 5/18: L shoulder is grossly 4+/5.  R shoulder flexion 3+/5; R shoulder abduction 4-/5; R shoulder IR/ER both 4-/5 (B); R elbow flexion 4/5, elbow ext. 5/5 B, shoulder ext. 5/5 B; 7/7:  L shoulder grossly 4+/5, R shoulder 4/5 except shoulder flexion 4-/5, R shoulder IR/ER pain limited to 3/5 8/10: grossly 4/5 with IR/ER painful limiting full range 8/29: 4/5    Time 12    Period Weeks    Status Not Met      PT LONG TERM GOAL #  5   Title Pt will demonstrate decrease in NDI by at least 19% in order to demonstrate clinically significant reduction in disability related to neck/TMJ injury/pain    Baseline 8/3: 22%    Time 12    Period Weeks    Status New      PT LONG TERM GOAL #6   Title Patient will report decreased worst VAS of jaw to <3/10 for decreased pain with eating and improved quality of life    Baseline 8/3: 6/10    Time 12     Period Weeks    Status New      PT LONG TERM GOAL #7   Title Patient will report little to no clicking/catching with chewing for improved quality of life and ADL performance    Baseline 8/3: audible clicking/catching    Time 12    Period Weeks    Status New                   Plan - 09/16/21 1536     Clinical Impression Statement Conntiued with plan of care. Author able to identify 2-3 large taut bands within each masseter muscle, 4-5 associated with symptomatic pain, Began with trigger point dry needling to bands, strong twitch response on Rt side, then manual sustained release stretching. Pt performs self TMJ mobilitzation as previously taught after soft tissue relases. Attention was then brought to the Rt upper trapezius bands, pt tolerating fair amount of sustained compression, intensitifcastion of Rt ram's horn distribution HA similar to chronic issue. followed with ART to address passive connective tissue componenets of Rt UT due to chronicity of trigger points, then gentle low level activation exercises to increase tissue perfusion. Pt reports excellent improvement in symptomatic pain at end of session, she plans to continue with HEP and heat/ice as needed for symptom control as she will see the dentist tomorrow.    Personal Factors and Comorbidities Comorbidity 1;Comorbidity 2;Comorbidity 3+;Past/Current Experience;Social Background;Time since onset of injury/illness/exacerbation;Transportation;Fitness;Other    Comorbidities Pertinent: recent hx of COVID19, HTN, hyperlipidemia, pre-diabetes, AFIB, CHF, depression, anxiety, OA, atypical migraine, asthma.    Examination-Activity Limitations Bathing;Bed Mobility;Hygiene/Grooming;Lift;Caring for Others;Reach Overhead;Carry;Dressing;Sleep;Other;Self Feeding    Examination-Participation Restrictions Laundry;Cleaning;Driving;Community Activity;Other;Yard Work;Shop;Meal Prep;Volunteer    Stability/Clinical Decision Making  Stable/Uncomplicated    Clinical Decision Making Low    Rehab Potential Good    PT Frequency 2x / week    PT Duration 12 weeks    PT Treatment/Interventions ADLs/Self Care Home Management;Biofeedback;Cryotherapy;Electrical Stimulation;Moist Heat;Traction;Ultrasound;DME Instruction;Functional mobility training;Therapeutic activities;Therapeutic exercise;Neuromuscular re-education;Patient/family education;Orthotic Fit/Training;Manual techniques;Scar mobilization;Passive range of motion;Dry needling;Taping;Energy conservation;Joint Manipulations;Spinal Manipulations;Gait training;Visual/perceptual remediation/compensation    PT Next Visit Plan Reassess TMJ and symptoms after dental procedure on 09/14/21    PT Home Exercise Plan No changes at this visit.    Consulted and Agree with Plan of Care Patient             Patient will benefit from skilled therapeutic intervention in order to improve the following deficits and impairments:  Increased fascial restricitons, Improper body mechanics, Pain, Decreased mobility, Increased muscle spasms, Postural dysfunction, Decreased activity tolerance, Decreased endurance, Decreased range of motion, Decreased strength, Hypomobility, Impaired UE functional use, Impaired flexibility, Decreased coordination, Impaired tone  Visit Diagnosis: Muscle weakness (generalized)  Cervicalgia  Abnormal posture  Chronic right shoulder pain  Other abnormalities of gait and mobility  Other lack of coordination     Problem List Patient Active Problem List   Diagnosis Date Noted   Medicare annual wellness visit,  initial 07/17/2021   TMJ (dislocation of temporomandibular joint) 07/17/2021   Encounter for screening mammogram for breast cancer 06/05/2021   Diarrhea 04/11/2020   Epigastric pain 02/19/2020   Dark stools 02/19/2020   PAF (paroxysmal atrial fibrillation) ([place]) 02/05/2019   Shortness of breath 01/27/2019   Irregular surface of cornea 12/25/2018    HPV (human papilloma virus) infection 12/01/2016   Screening mammogram, encounter for 11/29/2016   Estrogen deficiency 11/29/2016   Encounter for routine gynecological examination 11/29/2016   Grade III hemorrhoids 10/06/2016   Tachycardia 08/14/2016   Hemorrhoids 08/13/2016   Atypical migraine 08/03/2016   CAD (coronary artery disease)    AAA (abdominal aortic aneurysm) (HCC)    Chronic combined systolic (congestive) and diastolic (congestive) heart failure ([place])    Prediabetes 02/11/2016   Generalized anxiety disorder 12/08/2015   Panic attacks 07/28/2015   Sensorineural hearing loss 03/27/2013   Vitamin D deficiency 07/03/2012   H/O aortic dissection 05/12/2012   Routine general medical examination at a health care facility 09/20/2011   S/P AVR (aortic valve replacement) 01/07/2011   CORONARY ARTERY BYPASS GRAFT, HX OF 01/07/2011   Arthritis 12/20/2010   Back pain 12/15/2010   H/O dissecting abdominal aortic aneurysm repair 08/24/2010   IRRITABLE BOWEL SYNDROME 09/09/2009   Obstructive sleep apnea 08/04/2009   External hemorrhoid 06/26/2009   Osteoporosis 01/09/2009   Hyperlipidemia 07/26/2007   Depression with anxiety 07/26/2007   Essential hypertension 07/26/2007   Allergic rhinitis 07/26/2007   Asthma 07/26/2007   GERD 07/26/2007   Osteoarthritis 07/26/2007   Ocular migraine 07/26/2007   Mild intermittent asthma without complication 12/30/347   3:45 PM, 09/16/21 Etta Grandchild, PT, DPT Physical Therapist - [facility]  [phone])    [place], PT 09/16/2021, 3:43 PM  [place] [facility] SERVICES [address] [place], [place], [address] Phone: [phone]   Fax:  [phone]  Name: Rachel Torres MRN: [id number] Date of Birth: [birth year]

## 2021-09-22 ENCOUNTER — Ambulatory Visit: Payer: Medicare HMO

## 2021-09-23 ENCOUNTER — Other Ambulatory Visit: Payer: Self-pay

## 2021-09-23 ENCOUNTER — Ambulatory Visit (INDEPENDENT_AMBULATORY_CARE_PROVIDER_SITE_OTHER): Payer: Medicare HMO

## 2021-09-23 ENCOUNTER — Ambulatory Visit: Payer: Medicare HMO | Attending: Family Medicine

## 2021-09-23 DIAGNOSIS — I480 Paroxysmal atrial fibrillation: Secondary | ICD-10-CM

## 2021-09-23 DIAGNOSIS — M6281 Muscle weakness (generalized): Secondary | ICD-10-CM | POA: Insufficient documentation

## 2021-09-23 DIAGNOSIS — M542 Cervicalgia: Secondary | ICD-10-CM | POA: Insufficient documentation

## 2021-09-23 DIAGNOSIS — I498 Other specified cardiac arrhythmias: Secondary | ICD-10-CM

## 2021-09-23 DIAGNOSIS — R293 Abnormal posture: Secondary | ICD-10-CM | POA: Diagnosis not present

## 2021-09-23 NOTE — Progress Notes (Unsigned)
ZIO monitor to be mailed to pt for 14 day wear Pt to follow-up after the monitor results.  Dr. [name] replied to pt 9/15/22022 regarding monitor on her myChart, nurse was not informed to order monitor before her appt.   Message to will be sent to scheduling to call to have pt schedule for an appt in 4 weeks.

## 2021-09-23 NOTE — Therapy (Signed)
[place] MAIN [facility] SERVICES [address] [place], [place], [address] Phone: [phone]   Fax:  [phone]  Physical Therapy Treatment  Patient Details  Name: Rachel Torres MRN: [id number] Date of Birth: 1956/03/07 Referring Provider (PT): Gregor Hams, MD   Encounter Date: 09/23/2021   PT End of Session - 09/23/21 1451     Visit Number 37    Number of Visits 32    Date for PT Re-Evaluation 10/21/21    Authorization Type [employer] Primary; [employer] Medicare Secondary    Authorization Time Period 07/29/21-10/21/21    Progress Note Due on Visit 40    PT Start Time 1515    PT Stop Time 1559    PT Time Calculation (min) 44 min    Activity Tolerance Patient tolerated treatment well;No increased pain    Behavior During Therapy WFL for tasks assessed/performed             Past Medical History:  Diagnosis Date   AAA (abdominal aortic aneurysm)    a. Chronic w/o evidence of aneurysmal dil on CTA 01/2016.   Alcohol abuse    Allergy    Anemia    Anxiety    Asthma    CAD (coronary artery disease)    a. 08/2010 s/p CABG x 1 (VG->RCA) @ time of Ao dissection repair; b. 01/2016 Lexiscan MV: mid antsept/apical defect w/ ? peri-infarct ischemia-->likely attenuation-->Med Rx.   Chewing difficulty    Chronic combined systolic (congestive) and diastolic (congestive) heart failure ([place])    a. 2012 EF 30-35%; b. 04/2015 EF 40-45%; c. 01/2016 Echo: Ef 55-65%, nl AoV bioprosthesis, nl RV, nl PASP.   Constipation    Depression    Edema of both lower extremities    Gallbladder sludge    GERD (gastroesophageal reflux disease)    Heart failure (HCC)    Heart valve problem    History of Bicuspid Aortic Valve    a. 08/2010 s/p AVR @ time of Ao dissection repair; b. 01/2016 Echo: Ef 55-65%, nl AoV bioprosthesis, nl RV, nl PASP.   Hx of repair of dissecting thoracic aortic aneurysm, Stanford type A    a. 08/2010 s/p repair with AVR and VG->RCA; b.  01/2016 CTA: stable appearance of Asc Thoracic Aortic graft. Opacification of flase lumen of chronic abd Ao dissection w/ retrograde flow through lumbar arteries. No aneurysmal dil of flase lumen.   Hyperlipidemia    Hypertension    Hypertensive heart disease    IBS (irritable bowel syndrome)    Internal hemorrhoids    Kidney problem    Marfan syndrome    pt denies   Migraine    Obstructive sleep apnea    Osteoarthritis    Osteoporosis    Palpitations    Pneumonia    RA (rheumatoid arthritis) ([place])    a. Followed by Dr. [name]    Past Surgical History:  Procedure Laterality Date   ABDOMINAL AORTIC ANEURYSM REPAIR     CARDIAC CATHETERIZATION  2001   [place]     ESOPHAGOGASTRODUODENOSCOPY (EGD) WITH PROPOFOL N/A 05/08/2020   Procedure: ESOPHAGOGASTRODUODENOSCOPY (EGD) WITH PROPOFOL;  Surgeon: Virgel Manifold, MD;  Location: [facility] ENDOSCOPY;  Service: Endoscopy;  Laterality: N/A;   FRACTURE SURGERY Right    shoulder replacement   HEMORRHOID BANDING     ORIF DISTAL RADIUS FRACTURE Left    UPPER GASTROINTESTINAL ENDOSCOPY  There were no vitals filed for this visit.   Subjective Assessment - 09/23/21 1518     Subjective Pt reports 4/10 bilat jaw pain today, Rt>Lt near angle of manible bilat. Pt reports headache this morning that has since resolved with Tylenol. Pt was fit with permanent crowns (2 on right side) since last visit and will be getting one more on left side. Pt denies any other updates or medical changes. Pt has been working on her jaw/neck HEP.    Pertinent History Pt is a 65 y/o female presenting to therapy due to R shoulder pain. Pt had R shoulder replacement 12-13 years ago.  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 She says shoulder pain started this past January as she was helping lift her husband and his assistive devices. She reports she has since stopped  lifting him and his walker as they have caregivers who come to the home for 16 hours/week and can help. Pt is most concerned that her shoulder hurts when she drives and washes her hair. Her shoulder does not hurt constantly, but she states she has pain "the more I use it." She feels a catch and stabbing sensation in R anterior shoulder that can travel into bicep and that sometimes the pain is a burning sensation. She sometimes has pain in L shoulder.  She rates her worst R shoulder pain as 10/10, best pain is 0/10. Her R shoulder pain is currently 1.5/10. She reports that rest, ice and naproxen help with pain relief. She denies redness/swelling/temp difference and N/T in UEs.  Patient has been seen by other therapists in this clinic previously for cervicalgia and back related injuries/pain. Pt PMH includes COVID19, upper GI stomach issues, aorta type A dissection (2011) with aortic valve replacement, bypass of R coronary artery, HTN, hyperlipidemia, pre-diabetes, AFIB, CHF, depression, anxiety, OA, GERD, atypical migraine, asthma, and allergic rhinitis. Additional diagnosis of TMJ has been added with patient reporting pain has been occuring for ~40 years, worsens with stress and grinding of teeth.    Currently in Pain? Yes    Pain Score 4     Pain Location Jaw    Pain Orientation Right;Left    Pain Descriptors / Indicators Aching    Pain Type Chronic pain    Pain Onset More than a month ago    Pain Frequency Constant                  Manual:  TMJ:  -gentle anterior and inferior mobilization of bilateral jaw 8x 30 seconds each side  -supine cervical mobilizations with PA mobilizations grade II x 6 minutes  -suboccipital release 3x30 second holds -cervical side bend with overpressure 2x30 second holds -cervical rotation with overpressure 2x 30 second holds  -J mobilization 30 seconds x 2 trials -scapular retraction and depression R side x 30 seconds x 2 trials Inferior jaw mobilization to  R side 4x 20 seconds    TherEx: Cervical extension and flexion 10x with focus on full range and jaw retraction and protraction Chin tucks; 10 x 3 second holds  Supine jaw protrustion 10x with cue for arc of motion within pain free range   Trigger Point Dry Needling (TDN), unbilled Education performed with patient regarding potential benefit of TDN. Reviewed precautions and risks with patient. Reviewed special precautions/risks over lung fields which include pneumothorax. Reviewed signs and symptoms of pneumothorax and advised pt to go to ER immediately if these symptoms  develop advise them of dry needling treatment. Extensive time spent with pt to ensure full understanding of TDN risks. Pt provided verbal consent to treatment. TDN performed to  with 0.20 x .30 single needle placements with local twitch response (LTR). Pistoning technique utilized. Improved pain-free motion following intervention. Musculature addressed is as followed: masseter bilateral, pterygoid bilateral, temporal region bilateral, R upper trap. X 12 minutes        Pt educated throughout session about proper posture and technique with exercises. Improved exercise technique, movement at target joints, use of target muscles after min to mod verbal, visual, tactile cues    Patient presents with increased trigger points on masseter and pterygoid musculature with R > L. She is highly motivated for pain reduction and has decreased episodic clicking and popping. TDN performed with patient reporting decreased pain. Pt will benefit from skilled PT services to address deficits and return to pain-free function at home and with caregiving                   PT Education - 09/23/21 1451     Education Details exercise technique, body mechanics    Person(s) Educated Patient    Methods Explanation;Demonstration;Tactile cues;Verbal cues    Comprehension Verbalized understanding;Returned demonstration;Verbal cues  required;Tactile cues required              PT Short Term Goals - 07/29/21 1445       PT SHORT TERM GOAL #1   Title Patient will be independent in home exercise program to improve strength/mobility for better functional independence with ADLs.    Baseline 02/24/2021 Pt issued HEP; 5/19: HEP to be advanced, pt has been unable to perform HEP consistently d/t spouse surgery and son's wedding.; 7/7: pt reports she has not been performing HEP "as often as I'm supposed to." 8/10: HEP compliant    Time 6    Period Weeks    Status Achieved    Target Date 08/06/21               PT Long Term Goals - 08/17/21 1522       PT LONG TERM GOAL #1   Title Patient will report a worst pain of 3/10  in last 7 days in her R shoulder to improve tolerance with ADLs and reduced symptoms with activities.    Baseline 02/24/2021: Pt reports worst pain as 10/10.; 5/18: 4/10 is worst pain; 7/7: pt reports worst pain 5/10 8/10: 7/10 pain 8/29: 7/10    Time 12    Period Weeks    Status Not Met      PT LONG TERM GOAL #2   Title Patient will decrease Quick DASH scores by > 8 points demonstrating reduced self-reported upper extremity disability.    Baseline 02/24/2021: QuickDash Disability/symptom score 40, Work score 37.5; 5/18: Disability/symtpom 38.6, Work 43.75; 7/7: disability/sx score 50%, work 62.5% 8/10: 37.7% caregiver work 37.5% 8/29: 52.3%, caregiving/work 56%    Time 12    Period Weeks    Status Not Met      PT LONG TERM GOAL #3   Title Patient will increase FOTO score to equal to or greater than 60 to demonstrate statistically significant improvement in mobility and quality of life.    Baseline 02/24/2021: 55; 5/18: 60; 7/7: 56 8/10: 56% 8/29: 52%    Time 12    Period Weeks    Status Not Met      PT LONG TERM GOAL #4   Title Patient  will increase BUE MMT scores by at least 1/2 point as to improve functional strength and endurance for overhead activities and ADLs such as driving and washing her  hair.    Baseline 02/24/2021: L/R shoulder flexion 4/5, 3+/5; shoulder abduction 4/5 B; shoulder extension 4+5 B; shoulder IR/ER both grossly 4/5 B; elbow flexion/extension 5/5 for both B; wrist flexion/extension 5/5 for both B; 5/18: L shoulder is grossly 4+/5.  R shoulder flexion 3+/5; R shoulder abduction 4-/5; R shoulder IR/ER both 4-/5 (B); R elbow flexion 4/5, elbow ext. 5/5 B, shoulder ext. 5/5 B; 7/7:  L shoulder grossly 4+/5, R shoulder 4/5 except shoulder flexion 4-/5, R shoulder IR/ER pain limited to 3/5 8/10: grossly 4/5 with IR/ER painful limiting full range 8/29: 4/5    Time 12    Period Weeks    Status Not Met      PT LONG TERM GOAL #5   Title Pt will demonstrate decrease in NDI by at least 19% in order to demonstrate clinically significant reduction in disability related to neck/TMJ injury/pain    Baseline 8/3: 22%    Time 12    Period Weeks    Status New      PT LONG TERM GOAL #6   Title Patient will report decreased worst VAS of jaw to <3/10 for decreased pain with eating and improved quality of life    Baseline 8/3: 6/10    Time 12    Period Weeks    Status New      PT LONG TERM GOAL #7   Title Patient will report little to no clicking/catching with chewing for improved quality of life and ADL performance    Baseline 8/3: audible clicking/catching    Time 12    Period Weeks    Status New                   Plan - 09/23/21 1621     Clinical Impression Statement Patient presents with increased trigger points on masseter and pterygoid musculature with R > L. She is highly motivated for pain reduction and has decreased episodic clicking and popping. TDN performed with patient reporting decreased pain. Pt will benefit from skilled PT services to address deficits and return to pain-free function at home and with caregiving    Personal Factors and Comorbidities Comorbidity 1;Comorbidity 2;Comorbidity 3+;Past/Current Experience;Social Background;Time since onset of  injury/illness/exacerbation;Transportation;Fitness;Other    Comorbidities Pertinent: recent hx of COVID19, HTN, hyperlipidemia, pre-diabetes, AFIB, CHF, depression, anxiety, OA, atypical migraine, asthma.    Examination-Activity Limitations Bathing;Bed Mobility;Hygiene/Grooming;Lift;Caring for Others;Reach Overhead;Carry;Dressing;Sleep;Other;Self Feeding    Examination-Participation Restrictions Laundry;Cleaning;Driving;Community Activity;Other;Yard Work;Shop;Meal Prep;Volunteer    Stability/Clinical Decision Making Stable/Uncomplicated    Rehab Potential Good    PT Frequency 2x / week    PT Duration 12 weeks    PT Treatment/Interventions ADLs/Self Care Home Management;Biofeedback;Cryotherapy;Electrical Stimulation;Moist Heat;Traction;Ultrasound;DME Instruction;Functional mobility training;Therapeutic activities;Therapeutic exercise;Neuromuscular re-education;Patient/family education;Orthotic Fit/Training;Manual techniques;Scar mobilization;Passive range of motion;Dry needling;Taping;Energy conservation;Joint Manipulations;Spinal Manipulations;Gait training;Visual/perceptual remediation/compensation    PT Next Visit Plan Reassess TMJ and symptoms after dental procedure on 09/14/21    PT Home Exercise Plan No changes at this visit.    Consulted and Agree with Plan of Care Patient             Patient will benefit from skilled therapeutic intervention in order to improve the following deficits and impairments:  Increased fascial restricitons, Improper body mechanics, Pain, Decreased mobility, Increased muscle spasms, Postural dysfunction, Decreased activity tolerance, Decreased endurance, Decreased range of  motion, Decreased strength, Hypomobility, Impaired UE functional use, Impaired flexibility, Decreased coordination, Impaired tone  Visit Diagnosis: Muscle weakness (generalized)  Cervicalgia  Abnormal posture     Problem List Patient Active Problem List   Diagnosis Date Noted    Medicare annual wellness visit, initial 07/17/2021   TMJ (dislocation of temporomandibular joint) 07/17/2021   Encounter for screening mammogram for breast cancer 06/05/2021   Diarrhea 04/11/2020   Epigastric pain 02/19/2020   Dark stools 02/19/2020   PAF (paroxysmal atrial fibrillation) ([place]) 02/05/2019   Shortness of breath 01/27/2019   Irregular surface of cornea 12/25/2018   HPV (human papilloma virus) infection 12/01/2016   Screening mammogram, encounter for 11/29/2016   Estrogen deficiency 11/29/2016   Encounter for routine gynecological examination 11/29/2016   Grade III hemorrhoids 10/06/2016   Tachycardia 08/14/2016   Hemorrhoids 08/13/2016   Atypical migraine 08/03/2016   CAD (coronary artery disease)    AAA (abdominal aortic aneurysm)    Chronic combined systolic (congestive) and diastolic (congestive) heart failure ([place])    Prediabetes 02/11/2016   Generalized anxiety disorder 12/08/2015   Panic attacks 07/28/2015   Sensorineural hearing loss 03/27/2013   Vitamin D deficiency 07/03/2012   H/O aortic dissection 05/12/2012   Routine general medical examination at a health care facility 09/20/2011   S/P AVR (aortic valve replacement) 01/07/2011   CORONARY ARTERY BYPASS GRAFT, HX OF 01/07/2011   Arthritis 12/20/2010   Back pain 12/15/2010   H/O dissecting abdominal aortic aneurysm repair 08/24/2010   IRRITABLE BOWEL SYNDROME 09/09/2009   Obstructive sleep apnea 08/04/2009   External hemorrhoid 06/26/2009   Osteoporosis 01/09/2009   Hyperlipidemia 07/26/2007   Depression with anxiety 07/26/2007   Essential hypertension 07/26/2007   Allergic rhinitis 07/26/2007   Asthma 07/26/2007   GERD 07/26/2007   Osteoarthritis 07/26/2007   Ocular migraine 07/26/2007   Mild intermittent asthma without complication 44/12/270    Janna Arch, PT, DPT  09/23/2021, 4:22 PM  [place] MAIN [facility] SERVICES [address] [place], [place],  [address] Phone: [phone]   Fax:  [phone]  Name: Rachel Torres MRN: [id number] Date of Birth: 1956-01-18

## 2021-09-23 NOTE — Telephone Encounter (Signed)
Patient came by office to check on ZIO monitor  States one should have been mailed to her last month Please review

## 2021-09-29 ENCOUNTER — Ambulatory Visit: Payer: Medicare HMO

## 2021-09-30 ENCOUNTER — Other Ambulatory Visit: Payer: Self-pay

## 2021-09-30 ENCOUNTER — Ambulatory Visit: Payer: Medicare HMO

## 2021-09-30 DIAGNOSIS — M542 Cervicalgia: Secondary | ICD-10-CM

## 2021-09-30 DIAGNOSIS — R293 Abnormal posture: Secondary | ICD-10-CM

## 2021-09-30 DIAGNOSIS — M6281 Muscle weakness (generalized): Secondary | ICD-10-CM

## 2021-09-30 NOTE — Therapy (Signed)
[place] [facility] [address] [place], [place], [address] Phone: [phone]   Fax:  [phone]  Physical Therapy Treatment  Patient Details  Name: Rachel Torres MRN: [id number] Date of Birth: [birth year] Referring Provider (PT): Gregor Hams, MD   Encounter Date: 09/30/2021   PT End of Session - 09/30/21 1005     Visit Number 38    Number of Visits 31    Date for PT Re-Evaluation 10/21/21    Authorization Type [employer] Primary; [employer] Medicare Secondary    Authorization Time Period 07/29/21-10/21/21    Progress Note Due on Visit 40    PT Start Time 1014    PT Stop Time 1059    PT Time Calculation (min) 45 min    Activity Tolerance Patient tolerated treatment well;No increased pain    Behavior During Therapy WFL for tasks assessed/performed             Past Medical History:  Diagnosis Date   AAA (abdominal aortic aneurysm)    a. Chronic w/o evidence of aneurysmal dil on CTA 01/2016.   Alcohol abuse    Allergy    Anemia    Anxiety    Asthma    CAD (coronary artery disease)    a. 08/2010 s/p CABG x 1 (VG->RCA) @ time of Ao dissection repair; b. 01/2016 Lexiscan MV: mid antsept/apical defect w/ ? peri-infarct ischemia-->likely attenuation-->Med Rx.   Chewing difficulty    Chronic combined systolic (congestive) and diastolic (congestive) heart failure ([place])    a. 2012 EF 30-35%; b. 04/2015 EF 40-45%; c. 01/2016 Echo: Ef 55-65%, nl AoV bioprosthesis, nl RV, nl PASP.   Constipation    Depression    Edema of both lower extremities    Gallbladder sludge    GERD (gastroesophageal reflux disease)    Heart failure (HCC)    Heart valve problem    History of Bicuspid Aortic Valve    a. 08/2010 s/p AVR @ time of Ao dissection repair; b. 01/2016 Echo: Ef 55-65%, nl AoV bioprosthesis, nl RV, nl PASP.   Hx of repair of dissecting thoracic aortic aneurysm, [place] type A    a. 08/2010 s/p repair with AVR and VG->RCA; b.  01/2016 CTA: stable appearance of Asc Thoracic Aortic graft. Opacification of flase lumen of chronic abd Ao dissection w/ retrograde flow through lumbar arteries. No aneurysmal dil of flase lumen.   Hyperlipidemia    Hypertension    Hypertensive heart disease    IBS (irritable bowel syndrome)    Internal hemorrhoids    Kidney problem    Marfan syndrome    pt denies   Migraine    Obstructive sleep apnea    Osteoarthritis    Osteoporosis    Palpitations    Pneumonia    RA (rheumatoid arthritis) ([place])    a. Followed by Dr. [name]    Past Surgical History:  Procedure Laterality Date   ABDOMINAL AORTIC ANEURYSM REPAIR     CARDIAC CATHETERIZATION  2001   [place]     ESOPHAGOGASTRODUODENOSCOPY (EGD) WITH PROPOFOL N/A 05/08/2020   Procedure: ESOPHAGOGASTRODUODENOSCOPY (EGD) WITH PROPOFOL;  Surgeon: Virgel Manifold, MD;  Location: [facility];  Service: Endoscopy;  Laterality: N/A;   FRACTURE SURGERY Right    shoulder replacement   HEMORRHOID BANDING     ORIF DISTAL RADIUS FRACTURE Left    UPPER GASTROINTESTINAL ENDOSCOPY  There were no vitals filed for this visit.   Subjective Assessment - 09/30/21 1116     Subjective Patient reports she is able to open her mouth without clicking. Had one day of 5/10 pain with migraine.    Pertinent History Pt is a 65 y/o female presenting to therapy due to R shoulder pain. Pt had R shoulder replacement 12-13 years ago.  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 She says shoulder pain started this past January as she was helping lift her husband and his assistive devices. She reports she has since stopped lifting him and his walker as they have caregivers who come to the home for 16 hours/week and can help. Pt is most concerned that her shoulder hurts when she drives and washes her hair. Her shoulder does not hurt constantly, but she states  she has pain "the more I use it." She feels a catch and stabbing sensation in R anterior shoulder that can travel into bicep and that sometimes the pain is a burning sensation. She sometimes has pain in L shoulder.  She rates her worst R shoulder pain as 10/10, best pain is 0/10. Her R shoulder pain is currently 1.5/10. She reports that rest, ice and naproxen help with pain relief. She denies redness/swelling/temp difference and N/T in UEs.  Patient has been seen by other therapists in this clinic previously for cervicalgia and back related injuries/pain. Pt PMH includes COVID19, upper GI stomach issues, aorta type A dissection (2011) with aortic valve replacement, bypass of R coronary artery, HTN, hyperlipidemia, pre-diabetes, AFIB, CHF, depression, anxiety, OA, GERD, atypical migraine, asthma, and allergic rhinitis. Additional diagnosis of TMJ has been added with patient reporting pain has been occuring for ~40 years, worsens with stress and grinding of teeth.    Currently in Pain? Yes    Pain Score 2     Pain Location Jaw    Pain Orientation Left    Pain Descriptors / Indicators Aching    Pain Type Chronic pain    Pain Onset More than a month ago    Pain Frequency Constant                           Manual:  -supine cervical mobilizations with PA mobilizations grade II x 6 minutes  -suboccipital release 2x30 second holds -cervical side bend with overpressure 3x30 second holds -cervical rotation with overpressure 3x 30 second holds  -J mobilization 30 seconds x 2 trials -scapular retraction and depression R side x 30 seconds x 2 trials      Trigger Point Dry Needling (TDN), unbilled Education performed with patient regarding potential benefit of TDN. Reviewed precautions and risks with patient. Reviewed special precautions/risks over lung fields which include pneumothorax. Reviewed signs and symptoms of pneumothorax and advised pt to go to ER immediately if these symptoms  develop advise them of dry needling treatment. Extensive time spent with pt to ensure full understanding of TDN risks. Pt provided verbal consent to treatment. TDN performed to  with 0.20 x .30 single needle placements with local twitch response (LTR). Pistoning technique utilized. Improved pain-free motion following intervention. Musculature addressed is as followed: masseter bilateral, pterygoid bilateral, temporal region bilateral, R upper trap. X 18 minutes        Pt educated throughout session about proper posture and technique with exercises. Improved exercise technique, movement at target joints, use of target muscles after  min to mod verbal, visual, tactile cues       Patient has increased tension in L mandibular region requiring additional TDN for reduction of symptoms. Patient has increased cervical ROM with repeated and prolonged holds of sidebending and rotation. Decreased upper trap tension noted this session indicating progression of patient. Decreased pain and clicking noted. Pt will benefit from skilled PT services to address deficits and return to pain-free function at home and with caregiving        PT Education - 09/30/21 1005     Education Details exercise technique, body mechanics    Person(s) Educated Patient    Methods Explanation;Demonstration;Tactile cues;Verbal cues    Comprehension Verbalized understanding;Returned demonstration;Verbal cues required;Tactile cues required              PT Short Term Goals - 07/29/21 1445       PT SHORT TERM GOAL #1   Title Patient will be independent in home exercise program to improve strength/mobility for better functional independence with ADLs.    Baseline 02/24/2021 Pt issued HEP; 5/19: HEP to be advanced, pt has been unable to perform HEP consistently d/t spouse surgery and son's wedding.; 7/7: pt reports she has not been performing HEP "as often as I'm supposed to." 8/10: HEP compliant    Time 6    Period Weeks     Status Achieved    Target Date 08/06/21               PT Long Term Goals - 08/17/21 1522       PT LONG TERM GOAL #1   Title Patient will report a worst pain of 3/10  in last 7 days in her R shoulder to improve tolerance with ADLs and reduced symptoms with activities.    Baseline 02/24/2021: Pt reports worst pain as 10/10.; 5/18: 4/10 is worst pain; 7/7: pt reports worst pain 5/10 8/10: 7/10 pain 8/29: 7/10    Time 12    Period Weeks    Status Not Met      PT LONG TERM GOAL #2   Title Patient will decrease Quick DASH scores by > 8 points demonstrating reduced self-reported upper extremity disability.    Baseline 02/24/2021: QuickDash Disability/symptom score 40, Work score 37.5; 5/18: Disability/symtpom 38.6, Work 43.75; 7/7: disability/sx score 50%, work 62.5% 8/10: 37.7% caregiver work 37.5% 8/29: 52.3%, caregiving/work 56%    Time 12    Period Weeks    Status Not Met      PT LONG TERM GOAL #3   Title Patient will increase FOTO score to equal to or greater than 60 to demonstrate statistically significant improvement in mobility and quality of life.    Baseline 02/24/2021: 55; 5/18: 60; 7/7: 56 8/10: 56% 8/29: 52%    Time 12    Period Weeks    Status Not Met      PT LONG TERM GOAL #4   Title Patient will increase BUE MMT scores by at least 1/2 point as to improve functional strength and endurance for overhead activities and ADLs such as driving and washing her hair.    Baseline 02/24/2021: L/R shoulder flexion 4/5, 3+/5; shoulder abduction 4/5 B; shoulder extension 4+5 B; shoulder IR/ER both grossly 4/5 B; elbow flexion/extension 5/5 for both B; wrist flexion/extension 5/5 for both B; 5/18: L shoulder is grossly 4+/5.  R shoulder flexion 3+/5; R shoulder abduction 4-/5; R shoulder IR/ER both 4-/5 (B); R elbow flexion 4/5, elbow ext. 5/5 B, shoulder ext.  5/5 B; 7/7:  L shoulder grossly 4+/5, R shoulder 4/5 except shoulder flexion 4-/5, R shoulder IR/ER pain limited to 3/5 8/10: grossly 4/5  with IR/ER painful limiting full range 8/29: 4/5    Time 12    Period Weeks    Status Not Met      PT LONG TERM GOAL #5   Title Pt will demonstrate decrease in NDI by at least 19% in order to demonstrate clinically significant reduction in disability related to neck/TMJ injury/pain    Baseline 8/3: 22%    Time 12    Period Weeks    Status New      PT LONG TERM GOAL #6   Title Patient will report decreased worst VAS of jaw to <3/10 for decreased pain with eating and improved quality of life    Baseline 8/3: 6/10    Time 12    Period Weeks    Status New      PT LONG TERM GOAL #7   Title Patient will report little to no clicking/catching with chewing for improved quality of life and ADL performance    Baseline 8/3: audible clicking/catching    Time 12    Period Weeks    Status New                   Plan - 09/30/21 1131     Clinical Impression Statement Patient has increased tension in L mandibular region requiring additional TDN for reduction of symptoms. Patient has increased cervical ROM with repeated and prolonged holds of sidebending and rotation. Decreased upper trap tension noted this session indicating progression of patient. Decreased pain and clicking noted. Pt will benefit from skilled PT services to address deficits and return to pain-free function at home and with caregiving    Personal Factors and Comorbidities Comorbidity 1;Comorbidity 2;Comorbidity 3+;Past/Current Experience;Social Background;Time since onset of injury/illness/exacerbation;Transportation;Fitness;Other    Comorbidities Pertinent: recent hx of COVID19, HTN, hyperlipidemia, pre-diabetes, AFIB, CHF, depression, anxiety, OA, atypical migraine, asthma.    Examination-Activity Limitations Bathing;Bed Mobility;Hygiene/Grooming;Lift;Caring for Others;Reach Overhead;Carry;Dressing;Sleep;Other;Self Feeding    Examination-Participation Restrictions Laundry;Cleaning;Driving;Community Activity;Other;Yard  Work;Shop;Meal Prep;Volunteer    Stability/Clinical Decision Making Stable/Uncomplicated    Rehab Potential Good    PT Frequency 2x / week    PT Duration 12 weeks    PT Treatment/Interventions ADLs/Self Care Home Management;Biofeedback;Cryotherapy;Electrical Stimulation;Moist Heat;Traction;Ultrasound;DME Instruction;Functional mobility training;Therapeutic activities;Therapeutic exercise;Neuromuscular re-education;Patient/family education;Orthotic Fit/Training;Manual techniques;Scar mobilization;Passive range of motion;Dry needling;Taping;Energy conservation;Joint Manipulations;Spinal Manipulations;Gait training;Visual/perceptual remediation/compensation    PT Next Visit Plan Reassess TMJ and symptoms after dental procedure on 09/14/21    PT Home Exercise Plan No changes at this visit.    Consulted and Agree with Plan of Care Patient             Patient will benefit from skilled therapeutic intervention in order to improve the following deficits and impairments:  Increased fascial restricitons, Improper body mechanics, Pain, Decreased mobility, Increased muscle spasms, Postural dysfunction, Decreased activity tolerance, Decreased endurance, Decreased range of motion, Decreased strength, Hypomobility, Impaired UE functional use, Impaired flexibility, Decreased coordination, Impaired tone  Visit Diagnosis: Muscle weakness (generalized)  Cervicalgia  Abnormal posture     Problem List Patient Active Problem List   Diagnosis Date Noted   Medicare annual wellness visit, initial 07/17/2021   TMJ (dislocation of temporomandibular joint) 07/17/2021   Encounter for screening mammogram for breast cancer 06/05/2021   Diarrhea 04/11/2020   Epigastric pain 02/19/2020   Dark stools 02/19/2020   PAF (paroxysmal atrial fibrillation) ([place])  02/05/2019   Shortness of breath 01/27/2019   Irregular surface of cornea 12/25/2018   HPV (human papilloma virus) infection 12/01/2016   Screening  mammogram, encounter for 11/29/2016   Estrogen deficiency 11/29/2016   Encounter for routine gynecological examination 11/29/2016   Grade III hemorrhoids 10/06/2016   Tachycardia 08/14/2016   Hemorrhoids 08/13/2016   Atypical migraine 08/03/2016   CAD (coronary artery disease)    AAA (abdominal aortic aneurysm)    Chronic combined systolic (congestive) and diastolic (congestive) heart failure ([place])    Prediabetes 02/11/2016   Generalized anxiety disorder 12/08/2015   Panic attacks 07/28/2015   Sensorineural hearing loss 03/27/2013   Vitamin D deficiency 07/03/2012   H/O aortic dissection 05/12/2012   Routine general medical examination at a health care facility 09/20/2011   S/P AVR (aortic valve replacement) 01/07/2011   CORONARY ARTERY BYPASS GRAFT, HX OF 01/07/2011   Arthritis 12/20/2010   Back pain 12/15/2010   H/O dissecting abdominal aortic aneurysm repair 08/24/2010   IRRITABLE BOWEL SYNDROME 09/09/2009   Obstructive sleep apnea 08/04/2009   External hemorrhoid 06/26/2009   Osteoporosis 01/09/2009   Hyperlipidemia 07/26/2007   Depression with anxiety 07/26/2007   Essential hypertension 07/26/2007   Allergic rhinitis 07/26/2007   Asthma 07/26/2007   GERD 07/26/2007   Osteoarthritis 07/26/2007   Ocular migraine 07/26/2007   Mild intermittent asthma without complication 07/37/1062    Janna Arch, PT, DPT  09/30/2021, 11:32 AM  [place] [facility] [address] [place], [place], [address] Phone: [phone]   Fax:  [phone]  Name: Rachel Torres MRN: [id number] Date of Birth: August 19, 1956

## 2021-10-06 ENCOUNTER — Ambulatory Visit: Payer: Medicare HMO

## 2021-10-07 ENCOUNTER — Other Ambulatory Visit: Payer: Self-pay

## 2021-10-07 ENCOUNTER — Ambulatory Visit: Payer: Medicare HMO

## 2021-10-07 DIAGNOSIS — M542 Cervicalgia: Secondary | ICD-10-CM

## 2021-10-07 DIAGNOSIS — R293 Abnormal posture: Secondary | ICD-10-CM

## 2021-10-07 DIAGNOSIS — M6281 Muscle weakness (generalized): Secondary | ICD-10-CM | POA: Diagnosis not present

## 2021-10-07 NOTE — Therapy (Signed)
[place] MAIN [facility] SERVICES [address] [place], [place], [address] Phone: [phone]   Fax:  [phone]  Physical Therapy Treatment  Patient Details  Name: Rachel Torres MRN: [id number] Date of Birth: 14-Jul-1956 Referring Provider (PT): Gregor Hams, MD   Encounter Date: 10/07/2021   PT End of Session - 10/07/21 1254     Visit Number 39    Number of Visits 67    Date for PT Re-Evaluation 10/21/21    Authorization Type [employer] Primary; [employer] Medicare Secondary    Authorization Time Period 07/29/21-10/21/21    Progress Note Due on Visit 40    PT Start Time 1345    PT Stop Time 1429    PT Time Calculation (min) 44 min    Activity Tolerance Patient tolerated treatment well;No increased pain    Behavior During Therapy WFL for tasks assessed/performed             Past Medical History:  Diagnosis Date   AAA (abdominal aortic aneurysm)    a. Chronic w/o evidence of aneurysmal dil on CTA 01/2016.   Alcohol abuse    Allergy    Anemia    Anxiety    Asthma    CAD (coronary artery disease)    a. 08/2010 s/p CABG x 1 (VG->RCA) @ time of Ao dissection repair; b. 01/2016 Lexiscan MV: mid antsept/apical defect w/ ? peri-infarct ischemia-->likely attenuation-->Med Rx.   Chewing difficulty    Chronic combined systolic (congestive) and diastolic (congestive) heart failure ([place])    a. 2012 EF 30-35%; b. 04/2015 EF 40-45%; c. 01/2016 Echo: Ef 55-65%, nl AoV bioprosthesis, nl RV, nl PASP.   Constipation    Depression    Edema of both lower extremities    Gallbladder sludge    GERD (gastroesophageal reflux disease)    Heart failure (HCC)    Heart valve problem    History of Bicuspid Aortic Valve    a. 08/2010 s/p AVR @ time of Ao dissection repair; b. 01/2016 Echo: Ef 55-65%, nl AoV bioprosthesis, nl RV, nl PASP.   Hx of repair of dissecting thoracic aortic aneurysm, Stanford type A    a. 08/2010 s/p repair with AVR and VG->RCA; b.  01/2016 CTA: stable appearance of Asc Thoracic Aortic graft. Opacification of flase lumen of chronic abd Ao dissection w/ retrograde flow through lumbar arteries. No aneurysmal dil of flase lumen.   Hyperlipidemia    Hypertension    Hypertensive heart disease    IBS (irritable bowel syndrome)    Internal hemorrhoids    Kidney problem    Marfan syndrome    pt denies   Migraine    Obstructive sleep apnea    Osteoarthritis    Osteoporosis    Palpitations    Pneumonia    RA (rheumatoid arthritis) ([place])    a. Followed by Dr. [name]    Past Surgical History:  Procedure Laterality Date   ABDOMINAL AORTIC ANEURYSM REPAIR     CARDIAC CATHETERIZATION  2001   [place]     ESOPHAGOGASTRODUODENOSCOPY (EGD) WITH PROPOFOL N/A 05/08/2020   Procedure: ESOPHAGOGASTRODUODENOSCOPY (EGD) WITH PROPOFOL;  Surgeon: Virgel Manifold, MD;  Location: [facility] ENDOSCOPY;  Service: Endoscopy;  Laterality: N/A;   FRACTURE SURGERY Right    shoulder replacement   HEMORRHOID BANDING     ORIF DISTAL RADIUS FRACTURE Left    UPPER GASTROINTESTINAL ENDOSCOPY  There were no vitals filed for this visit.   Subjective Assessment - 10/07/21 1439     Subjective Patient had a temporary dental procedure on L side of mouth yesterday, clicking returned after procedure.    Pertinent History Pt is a 65 y/o female presenting to therapy due to R shoulder pain. Pt had R shoulder replacement 12-13 years ago.  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 She says shoulder pain started this past January as she was helping lift her husband and his assistive devices. She reports she has since stopped lifting him and his walker as they have caregivers who come to the home for 16 hours/week and can help. Pt is most concerned that her shoulder hurts when she drives and washes her hair. Her shoulder does not hurt constantly, but she  states she has pain "the more I use it." She feels a catch and stabbing sensation in R anterior shoulder that can travel into bicep and that sometimes the pain is a burning sensation. She sometimes has pain in L shoulder.  She rates her worst R shoulder pain as 10/10, best pain is 0/10. Her R shoulder pain is currently 1.5/10. She reports that rest, ice and naproxen help with pain relief. She denies redness/swelling/temp difference and N/T in UEs.  Patient has been seen by other therapists in this clinic previously for cervicalgia and back related injuries/pain. Pt PMH includes COVID19, upper GI stomach issues, aorta type A dissection (2011) with aortic valve replacement, bypass of R coronary artery, HTN, hyperlipidemia, pre-diabetes, AFIB, CHF, depression, anxiety, OA, GERD, atypical migraine, asthma, and allergic rhinitis. Additional diagnosis of TMJ has been added with patient reporting pain has been occuring for ~40 years, worsens with stress and grinding of teeth.    Limitations Lifting;House hold activities;Other (comment)    How long can you sit comfortably? Not affected    How long can you stand comfortably? Not affected    Currently in Pain? Yes    Pain Score 3     Pain Location Jaw    Pain Orientation Left;Right    Pain Descriptors / Indicators Aching    Pain Type Chronic pain    Pain Onset More than a month ago    Pain Frequency Intermittent                      Manual:  -supine cervical mobilizations with PA mobilizations grade II x 6 minutes ; specific focus to L C2-C3 UPA for additional 3 minutes  -suboccipital release 2x30 second holds  -J mobilization 30 seconds x 2 trials Grade II thoracic mobilizations and rib inferior mobilizations for postural correction x5 minutes  -inferior mandibular mobilization 10x 10 second holds; decreased clicking by end of mobilization   TherEx:  Towel: lateral SNAG 5x 15 second holds each LE Upper trap stretch 2x 30 seconds each  side Scapular retraction and depression x2 minutes       Trigger Point Dry Needling (TDN), unbilled Education performed with patient regarding potential benefit of TDN. Reviewed precautions and risks with patient. Reviewed special precautions/risks over lung fields which include pneumothorax. Reviewed signs and symptoms of pneumothorax and advised pt to go to ER immediately if these symptoms develop advise them of dry needling treatment. Extensive time spent with pt to ensure full understanding of TDN risks. Pt provided verbal consent to treatment. TDN performed to  with 0.20 x .30 single needle placements with  local twitch response (LTR). Pistoning technique utilized. Improved pain-free motion following intervention. Musculature addressed is as followed: masseter bilateral, pterygoid bilateral, temporal region bilateral, R upper trap. X 29mnutes        Pt educated throughout session about proper posture and technique with exercises. Improved exercise technique, movement at target joints, use of target muscles after min to mod verbal, visual, tactile cues   Patient initially presents with increased clicking and popping with "dry feeling" in jaw that is reduced by end of session. Patient has diffuse trigger points throughout mandibular, pterygoid musculature and postural musculatures. Repeated C2-3 on L side mobilizations decreased dysfunction by end of session in combination with inferior glide mandibular mobilizations. Pt will benefit from skilled PT services to address deficits and return to pain-free function at home and with caregiving                   PT Education - 10/07/21 1253     Education Details exercise technique, body mechanics    Person(s) Educated Patient    Methods Explanation;Demonstration;Tactile cues;Verbal cues    Comprehension Verbalized understanding;Returned demonstration;Verbal cues required;Tactile cues required              PT Short Term Goals -  07/29/21 1445       PT SHORT TERM GOAL #1   Title Patient will be independent in home exercise program to improve strength/mobility for better functional independence with ADLs.    Baseline 02/24/2021 Pt issued HEP; 5/19: HEP to be advanced, pt has been unable to perform HEP consistently d/t spouse surgery and son's wedding.; 7/7: pt reports she has not been performing HEP "as often as I'm supposed to." 8/10: HEP compliant    Time 6    Period Weeks    Status Achieved    Target Date 08/06/21               PT Long Term Goals - 08/17/21 1522       PT LONG TERM GOAL #1   Title Patient will report a worst pain of 3/10  in last 7 days in her R shoulder to improve tolerance with ADLs and reduced symptoms with activities.    Baseline 02/24/2021: Pt reports worst pain as 10/10.; 5/18: 4/10 is worst pain; 7/7: pt reports worst pain 5/10 8/10: 7/10 pain 8/29: 7/10    Time 12    Period Weeks    Status Not Met      PT LONG TERM GOAL #2   Title Patient will decrease Quick DASH scores by > 8 points demonstrating reduced self-reported upper extremity disability.    Baseline 02/24/2021: QuickDash Disability/symptom score 40, Work score 37.5; 5/18: Disability/symtpom 38.6, Work 43.75; 7/7: disability/sx score 50%, work 62.5% 8/10: 37.7% caregiver work 37.5% 8/29: 52.3%, caregiving/work 56%    Time 12    Period Weeks    Status Not Met      PT LONG TERM GOAL #3   Title Patient will increase FOTO score to equal to or greater than 60 to demonstrate statistically significant improvement in mobility and quality of life.    Baseline 02/24/2021: 55; 5/18: 60; 7/7: 56 8/10: 56% 8/29: 52%    Time 12    Period Weeks    Status Not Met      PT LONG TERM GOAL #4   Title Patient will increase BUE MMT scores by at least 1/2 point as to improve functional strength and endurance for overhead activities and ADLs such as  driving and washing her hair.    Baseline 02/24/2021: L/R shoulder flexion 4/5, 3+/5; shoulder  abduction 4/5 B; shoulder extension 4+5 B; shoulder IR/ER both grossly 4/5 B; elbow flexion/extension 5/5 for both B; wrist flexion/extension 5/5 for both B; 5/18: L shoulder is grossly 4+/5.  R shoulder flexion 3+/5; R shoulder abduction 4-/5; R shoulder IR/ER both 4-/5 (B); R elbow flexion 4/5, elbow ext. 5/5 B, shoulder ext. 5/5 B; 7/7:  L shoulder grossly 4+/5, R shoulder 4/5 except shoulder flexion 4-/5, R shoulder IR/ER pain limited to 3/5 8/10: grossly 4/5 with IR/ER painful limiting full range 8/29: 4/5    Time 12    Period Weeks    Status Not Met      PT LONG TERM GOAL #5   Title Pt will demonstrate decrease in NDI by at least 19% in order to demonstrate clinically significant reduction in disability related to neck/TMJ injury/pain    Baseline 8/3: 22%    Time 12    Period Weeks    Status New      PT LONG TERM GOAL #6   Title Patient will report decreased worst VAS of jaw to <3/10 for decreased pain with eating and improved quality of life    Baseline 8/3: 6/10    Time 12    Period Weeks    Status New      PT LONG TERM GOAL #7   Title Patient will report little to no clicking/catching with chewing for improved quality of life and ADL performance    Baseline 8/3: audible clicking/catching    Time 12    Period Weeks    Status New                   Plan - 10/07/21 1446     Clinical Impression Statement Patient initially presents with increased clicking and popping with "dry feeling" in jaw that is reduced by end of session. Patient has diffuse trigger points throughout mandibular, pterygoid musculature and postural musculatures. Repeated C2-3 on L side mobilizations decreased dysfunction by end of session in combination with inferior glide mandibular mobilizations. Pt will benefit from skilled PT services to address deficits and return to pain-free function at home and with caregiving    Personal Factors and Comorbidities Comorbidity 1;Comorbidity 2;Comorbidity  3+;Past/Current Experience;Social Background;Time since onset of injury/illness/exacerbation;Transportation;Fitness;Other    Comorbidities Pertinent: recent hx of COVID19, HTN, hyperlipidemia, pre-diabetes, AFIB, CHF, depression, anxiety, OA, atypical migraine, asthma.    Examination-Activity Limitations Bathing;Bed Mobility;Hygiene/Grooming;Lift;Caring for Others;Reach Overhead;Carry;Dressing;Sleep;Other;Self Feeding    Examination-Participation Restrictions Laundry;Cleaning;Driving;Community Activity;Other;Yard Work;Shop;Meal Prep;Volunteer    Stability/Clinical Decision Making Stable/Uncomplicated    Rehab Potential Good    PT Frequency 2x / week    PT Duration 12 weeks    PT Treatment/Interventions ADLs/Self Care Home Management;Biofeedback;Cryotherapy;Electrical Stimulation;Moist Heat;Traction;Ultrasound;DME Instruction;Functional mobility training;Therapeutic activities;Therapeutic exercise;Neuromuscular re-education;Patient/family education;Orthotic Fit/Training;Manual techniques;Scar mobilization;Passive range of motion;Dry needling;Taping;Energy conservation;Joint Manipulations;Spinal Manipulations;Gait training;Visual/perceptual remediation/compensation    PT Next Visit Plan Reassess TMJ and symptoms after dental procedure on 09/14/21    PT Home Exercise Plan No changes at this visit.    Consulted and Agree with Plan of Care Patient             Patient will benefit from skilled therapeutic intervention in order to improve the following deficits and impairments:  Increased fascial restricitons, Improper body mechanics, Pain, Decreased mobility, Increased muscle spasms, Postural dysfunction, Decreased activity tolerance, Decreased endurance, Decreased range of motion, Decreased strength, Hypomobility, Impaired UE functional use, Impaired  flexibility, Decreased coordination, Impaired tone  Visit Diagnosis: Muscle weakness (generalized)  Cervicalgia  Abnormal posture     Problem  List Patient Active Problem List   Diagnosis Date Noted   Medicare annual wellness visit, initial 07/17/2021   TMJ (dislocation of temporomandibular joint) 07/17/2021   Encounter for screening mammogram for breast cancer 06/05/2021   Diarrhea 04/11/2020   Epigastric pain 02/19/2020   Dark stools 02/19/2020   PAF (paroxysmal atrial fibrillation) ([place]) 02/05/2019   Shortness of breath 01/27/2019   Irregular surface of cornea 12/25/2018   HPV (human papilloma virus) infection 12/01/2016   Screening mammogram, encounter for 11/29/2016   Estrogen deficiency 11/29/2016   Encounter for routine gynecological examination 11/29/2016   Grade III hemorrhoids 10/06/2016   Tachycardia 08/14/2016   Hemorrhoids 08/13/2016   Atypical migraine 08/03/2016   CAD (coronary artery disease)    AAA (abdominal aortic aneurysm)    Chronic combined systolic (congestive) and diastolic (congestive) heart failure ([place])    Prediabetes 02/11/2016   Generalized anxiety disorder 12/08/2015   Panic attacks 07/28/2015   Sensorineural hearing loss 03/27/2013   Vitamin D deficiency 07/03/2012   H/O aortic dissection 05/12/2012   Routine general medical examination at a health care facility 09/20/2011   S/P AVR (aortic valve replacement) 01/07/2011   CORONARY ARTERY BYPASS GRAFT, HX OF 01/07/2011   Arthritis 12/20/2010   Back pain 12/15/2010   H/O dissecting abdominal aortic aneurysm repair 08/24/2010   IRRITABLE BOWEL SYNDROME 09/09/2009   Obstructive sleep apnea 08/04/2009   External hemorrhoid 06/26/2009   Osteoporosis 01/09/2009   Hyperlipidemia 07/26/2007   Depression with anxiety 07/26/2007   Essential hypertension 07/26/2007   Allergic rhinitis 07/26/2007   Asthma 07/26/2007   GERD 07/26/2007   Osteoarthritis 07/26/2007   Ocular migraine 07/26/2007   Mild intermittent asthma without complication 02/58/5277    Janna Arch, PT, DPT  10/07/2021, 2:47 PM  [name] MAIN [facility] SERVICES [address] [place], [place], [address] Phone: [phone]   Fax:  [phone]  Name: Rachel Torres MRN: [id number] Date of Birth: 20-Jun-1956

## 2021-10-08 ENCOUNTER — Ambulatory Visit: Payer: Medicare HMO

## 2021-10-08 ENCOUNTER — Other Ambulatory Visit: Payer: Self-pay

## 2021-10-08 ENCOUNTER — Encounter (INDEPENDENT_AMBULATORY_CARE_PROVIDER_SITE_OTHER): Payer: Self-pay | Admitting: Family Medicine

## 2021-10-08 ENCOUNTER — Ambulatory Visit (INDEPENDENT_AMBULATORY_CARE_PROVIDER_SITE_OTHER): Payer: Medicare HMO | Admitting: Family Medicine

## 2021-10-08 VITALS — BP 122/72 | HR 73 | Temp 97.5°F | Ht 65.0 in | Wt 164.0 lb

## 2021-10-08 DIAGNOSIS — R7303 Prediabetes: Secondary | ICD-10-CM | POA: Diagnosis not present

## 2021-10-08 DIAGNOSIS — Z6835 Body mass index (BMI) 35.0-35.9, adult: Secondary | ICD-10-CM

## 2021-10-08 MED ORDER — SEMAGLUTIDE (1 MG/DOSE) 4 MG/3ML ~~LOC~~ SOPN: 1.0000 mg | PEN_INJECTOR | SUBCUTANEOUS | 0 refills | Status: DC

## 2021-10-08 NOTE — Progress Notes (Signed)
Chief Complaint:   OBESITY Rachel Torres is here to discuss her progress with her obesity treatment plan along with follow-up of her obesity related diagnoses. Rachel Torres is on following a lower carbohydrate, vegetable and lean protein rich diet plan and states she is following her eating plan approximately 90% of the time. Rachel Torres states she is walking for 30 minutes 1 time per week.  Today's visit was #: 1 Starting weight: 213 lbs Starting date: 05/05/2020 Today's weight: 164 lbs Today's date: 10/08/2021 Total lbs lost to date: 49 Total lbs lost since last in-office visit: 4  Interim History: Rachel Torres continues to work on diet and weight loss. She is close to her weight goal. She has not added strengthening exercises yet.  Subjective:   1. Pre-diabetes Rachel Torres notes decreased polyphagia on Ozempic, and she has decreased her portion size and volume. She denies nausea, vomiting, or hypoglycemia.  Assessment/Plan:   1. Pre-diabetes Rachel Torres will continue to work on weight loss, exercise, and decreasing simple carbohydrates to help decrease the risk of diabetes. We will refill Ozempic 1 mg q weekly for 1 month.  2. Obesity with current BMI 27.4 Rachel Torres is currently in the action stage of change. As such, her goal is to continue with weight loss efforts. She has agreed to following a lower carbohydrate, vegetable and lean protein rich diet plan.   Exercise goals: As is, add strengthening exercise to help improve her RMR.  Behavioral modification strategies: increasing lean protein intake.  Rachel Torres has agreed to follow-up with our clinic in 4 weeks. She was informed of the importance of frequent follow-up visits to maximize her success with intensive lifestyle modifications for her multiple health conditions.   Objective:   Blood pressure 122/72, pulse 73, temperature (!) 97.5 F (36.4 C), height 5\' 5"  (1.651 m), weight 164 lb (74.4 kg), SpO2 98 %. Body mass index is 27.29  kg/m.  General: Cooperative, alert, well developed, in no acute distress. HEENT: Conjunctivae and lids unremarkable. Cardiovascular: Regular rhythm.  Lungs: Normal work of breathing. Neurologic: No focal deficits.   Lab Results  Component Value Date   CREATININE 0.80 08/13/2021   BUN 14 07/10/2021   NA 135 07/10/2021   K 4.8 07/10/2021   CL 99 07/10/2021   CO2 30 07/10/2021   Lab Results  Component Value Date   ALT 15 07/10/2021   AST 16 07/10/2021   ALKPHOS 62 07/10/2021   BILITOT 0.6 07/10/2021   Lab Results  Component Value Date   HGBA1C 5.6 07/10/2021   HGBA1C 5.5 02/26/2021   HGBA1C 5.7 (H) 10/29/2020   HGBA1C 5.6 07/23/2020   HGBA1C 5.9 (H) 05/05/2020   Lab Results  Component Value Date   INSULIN 17.3 02/26/2021   INSULIN 13.9 10/29/2020   INSULIN 22.5 07/23/2020   INSULIN 25.2 (H) 05/05/2020   Lab Results  Component Value Date   TSH 1.54 07/10/2021   Lab Results  Component Value Date   CHOL 166 07/10/2021   HDL 55.20 07/10/2021   LDLCALC 89 07/10/2021   LDLDIRECT 189.4 10/13/2011   TRIG 105.0 07/10/2021   CHOLHDL 3 07/10/2021   Lab Results  Component Value Date   VD25OH 74.87 07/10/2021   VD25OH 62.2 02/26/2021   VD25OH 59.5 10/29/2020   Lab Results  Component Value Date   WBC 5.6 07/10/2021   HGB 13.9 07/10/2021   HCT 41.3 07/10/2021   MCV 96.2 07/10/2021   PLT 274.0 07/10/2021   Lab Results  Component Value Date  FERRITIN 105.0 12/11/2010    Obesity Behavioral Intervention:   Approximately 15 minutes were spent on the discussion below.  ASK: We discussed the diagnosis of obesity with Rachel Torres today and Rachel Torres agreed to give [place] permission to discuss obesity behavioral modification therapy today.  ASSESS: Rachel Torres has the diagnosis of obesity and her BMI today is 27.4. Rachel Torres is in the action stage of change.   ADVISE: Rachel Torres was educated on the multiple health risks of obesity as well as the benefit of weight loss to improve  her health. She was advised of the need for long term treatment and the importance of lifestyle modifications to improve her current health and to decrease her risk of future health problems.  AGREE: Multiple dietary modification options and treatment options were discussed and Rachel Torres agreed to follow the recommendations documented in the above note.  ARRANGE: Rachel Torres was educated on the importance of frequent visits to treat obesity as outlined per CMS and USPSTF guidelines and agreed to schedule her next follow up appointment today.  Attestation Statements:   Reviewed by clinician on day of visit: allergies, medications, problem list, medical history, surgical history, family history, social history, and previous encounter notes.   I, Trixie Dredge, am acting as transcriptionist for [name], MD.  I have reviewed the above documentation for accuracy and completeness, and I agree with the above. -  [name], MD

## 2021-10-13 ENCOUNTER — Ambulatory Visit: Payer: Medicare HMO

## 2021-10-13 ENCOUNTER — Other Ambulatory Visit: Payer: Self-pay

## 2021-10-13 DIAGNOSIS — R293 Abnormal posture: Secondary | ICD-10-CM | POA: Diagnosis not present

## 2021-10-13 DIAGNOSIS — M542 Cervicalgia: Secondary | ICD-10-CM | POA: Diagnosis not present

## 2021-10-13 DIAGNOSIS — M6281 Muscle weakness (generalized): Secondary | ICD-10-CM

## 2021-10-13 NOTE — Therapy (Signed)
[place] MAIN [facility] SERVICES [address] [place], [place], [address] Phone: [phone]   Fax:  [phone]  Physical Therapy Treatment /Physical Therapy Progress Note   Dates of reporting period  08/03/21   to   10/13/21   Patient Details  Name: Rachel Torres MRN: [id number] Date of Birth: [birth year] Referring Provider (PT): Gregor Hams, MD   Encounter Date: 10/13/2021   PT End of Session - 10/13/21 1403     Visit Number 40    Number of Visits 3    Date for PT Re-Evaluation 10/21/21    Authorization Type [employer] Primary; [employer] Medicare Secondary    Authorization Time Period 07/29/21-10/21/21    Progress Note Due on Visit 40    PT Start Time 1259    PT Stop Time 1345    PT Time Calculation (min) 46 min    Activity Tolerance Patient tolerated treatment well;No increased pain    Behavior During Therapy WFL for tasks assessed/performed             Past Medical History:  Diagnosis Date   AAA (abdominal aortic aneurysm)    a. Chronic w/o evidence of aneurysmal dil on CTA 01/2016.   Alcohol abuse    Allergy    Anemia    Anxiety    Asthma    CAD (coronary artery disease)    a. 08/2010 s/p CABG x 1 (VG->RCA) @ time of Ao dissection repair; b. 01/2016 Lexiscan MV: mid antsept/apical defect w/ ? peri-infarct ischemia-->likely attenuation-->Med Rx.   Chewing difficulty    Chronic combined systolic (congestive) and diastolic (congestive) heart failure ([place])    a. 2012 EF 30-35%; b. 04/2015 EF 40-45%; c. 01/2016 Echo: Ef 55-65%, nl AoV bioprosthesis, nl RV, nl PASP.   Constipation    Depression    Edema of both lower extremities    Gallbladder sludge    GERD (gastroesophageal reflux disease)    Heart failure (HCC)    Heart valve problem    History of Bicuspid Aortic Valve    a. 08/2010 s/p AVR @ time of Ao dissection repair; b. 01/2016 Echo: Ef 55-65%, nl AoV bioprosthesis, nl RV, nl PASP.   Hx of repair of dissecting  thoracic aortic aneurysm, Stanford type A    a. 08/2010 s/p repair with AVR and VG->RCA; b. 01/2016 CTA: stable appearance of Asc Thoracic Aortic graft. Opacification of flase lumen of chronic abd Ao dissection w/ retrograde flow through lumbar arteries. No aneurysmal dil of flase lumen.   Hyperlipidemia    Hypertension    Hypertensive heart disease    IBS (irritable bowel syndrome)    Internal hemorrhoids    Kidney problem    Marfan syndrome    pt denies   Migraine    Obstructive sleep apnea    Osteoarthritis    Osteoporosis    Palpitations    Pneumonia    RA (rheumatoid arthritis) ([place])    a. Followed by Dr. [name]    Past Surgical History:  Procedure Laterality Date   ABDOMINAL AORTIC ANEURYSM REPAIR     CARDIAC CATHETERIZATION  2001   [place]     ESOPHAGOGASTRODUODENOSCOPY (EGD) WITH PROPOFOL N/A 05/08/2020   Procedure: ESOPHAGOGASTRODUODENOSCOPY (EGD) WITH PROPOFOL;  Surgeon: Virgel Manifold, MD;  Location: [facility] ENDOSCOPY;  Service: Endoscopy;  Laterality: N/A;   FRACTURE SURGERY Right    shoulder replacement  HEMORRHOID BANDING     ORIF DISTAL RADIUS FRACTURE Left    UPPER GASTROINTESTINAL ENDOSCOPY      There were no vitals filed for this visit.   Subjective Assessment - 10/13/21 1356     Subjective Patient will have her final crown placed next week, her temperary crown broke this morning. Is aware next session will be her last session.    Pertinent History Pt is a 65 y/o female presenting to therapy due to R shoulder pain. Pt had R shoulder replacement 12-13 years ago.  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 She says shoulder pain started this past January as she was helping lift her husband and his assistive devices. She reports she has since stopped lifting him and his walker as they have caregivers who come to the home for 16 hours/week and can help. Pt is  most concerned that her shoulder hurts when she drives and washes her hair. Her shoulder does not hurt constantly, but she states she has pain "the more I use it." She feels a catch and stabbing sensation in R anterior shoulder that can travel into bicep and that sometimes the pain is a burning sensation. She sometimes has pain in L shoulder.  She rates her worst R shoulder pain as 10/10, best pain is 0/10. Her R shoulder pain is currently 1.5/10. She reports that rest, ice and naproxen help with pain relief. She denies redness/swelling/temp difference and N/T in UEs.  Patient has been seen by other therapists in this clinic previously for cervicalgia and back related injuries/pain. Pt PMH includes COVID19, upper GI stomach issues, aorta type A dissection (2011) with aortic valve replacement, bypass of R coronary artery, HTN, hyperlipidemia, pre-diabetes, AFIB, CHF, depression, anxiety, OA, GERD, atypical migraine, asthma, and allergic rhinitis. Additional diagnosis of TMJ has been added with patient reporting pain has been occuring for ~40 years, worsens with stress and grinding of teeth.    Limitations Lifting;House hold activities;Other (comment)    How long can you sit comfortably? Not affected    How long can you stand comfortably? Not affected    Currently in Pain? Yes    Pain Score 3     Pain Location Jaw    Pain Orientation Right;Left    Pain Descriptors / Indicators Aching    Pain Type Chronic pain    Pain Onset More than a month ago                    NDI 14% VAS: 6/10  Patient will report little to no clicking/catching with chewing for improved quality of life and ADL; at least 75% better.       Manual:  -supine cervical mobilizations with PA mobilizations grade II x 6 minutes ; specific focus to L C2-C3 UPA for additional 3 minutes  -suboccipital release 2x30 second holds  -J mobilization 30 seconds x 2 trials Grade II thoracic mobilizations and rib inferior  mobilizations for postural correction x5 minutes  -inferior mandibular mobilization 10x 10 second holds; decreased clicking by end of mobilization    TherEx:  Towel: lateral SNAG 5x 15 second holds each LE Upper trap stretch 2x 30 seconds each side Scapular retraction and depression x2 minutes       Trigger Point Dry Needling (TDN), unbilled Education performed with patient regarding potential benefit of TDN. Reviewed precautions and risks with patient. Reviewed special precautions/risks over lung fields which include pneumothorax.  Reviewed signs and symptoms of pneumothorax and advised pt to go to ER immediately if these symptoms develop advise them of dry needling treatment. Extensive time spent with pt to ensure full understanding of TDN risks. Pt provided verbal consent to treatment. TDN performed to  with 0.20 x .30 single needle placements with local twitch response (LTR). Pistoning technique utilized. Improved pain-free motion following intervention. Musculature addressed is as followed: masseter bilateral, pterygoid bilateral, temporal region bilateral, R upper trap. X 73mnutes        Pt educated throughout session about proper posture and technique with exercises. Improved exercise technique, movement at target joints, use of target muscles after min to mod verbal, visual, tactile cues     Patient's condition has the potential to improve in response to therapy. Maximum improvement is yet to be obtained. The anticipated improvement is attainable and reasonable in a generally predictable time.  Patient reports she will be ready for discharge next week      Next session will be discharge.   Patient's goals assessed with patient reporting feeling ~75% better with pain occurring less frequently and more combined with migraines at this time. Her clicking has resolved. Neck pain is improving as well as range of available motion. Next session will be last session and patient is agreeable  to discharge. Patient's condition has the potential to improve in response to therapy. Maximum improvement is yet to be obtained. The anticipated improvement is attainable and reasonable in a generally predictable time.  Pt will benefit from skilled PT services to address deficits and return to pain-free function at home and with caregiving         PT Education - 10/13/21 1358     Education Details exercise technique, body mechanics, goals    Person(s) Educated Patient    Methods Explanation;Demonstration;Tactile cues;Verbal cues    Comprehension Verbalized understanding;Returned demonstration;Verbal cues required;Tactile cues required              PT Short Term Goals - 07/29/21 1445       PT SHORT TERM GOAL #1   Title Patient will be independent in home exercise program to improve strength/mobility for better functional independence with ADLs.    Baseline 02/24/2021 Pt issued HEP; 5/19: HEP to be advanced, pt has been unable to perform HEP consistently d/t spouse surgery and son's wedding.; 7/7: pt reports she has not been performing HEP "as often as I'm supposed to." 8/10: HEP compliant    Time 6    Period Weeks    Status Achieved    Target Date 08/06/21               PT Long Term Goals - 10/13/21 1402       PT LONG TERM GOAL #1   Title Patient will report a worst pain of 3/10  in last 7 days in her R shoulder to improve tolerance with ADLs and reduced symptoms with activities.    Baseline 02/24/2021: Pt reports worst pain as 10/10.; 5/18: 4/10 is worst pain; 7/7: pt reports worst pain 5/10 8/10: 7/10 pain 8/29: 7/10    Time 12    Period Weeks    Status Not Met      PT LONG TERM GOAL #2   Title Patient will decrease Quick DASH scores by > 8 points demonstrating reduced self-reported upper extremity disability.    Baseline 02/24/2021: QuickDash Disability/symptom score 40, Work score 37.5; 5/18: Disability/symtpom 38.6, Work 43.75; 7/7: disability/sx score 50%, work  62.5% 8/10: 37.7% caregiver work 37.5% 8/29: 52.3%, caregiving/work 56%    Time 12    Period Weeks    Status Not Met      PT LONG TERM GOAL #3   Title Patient will increase FOTO score to equal to or greater than 60 to demonstrate statistically significant improvement in mobility and quality of life.    Baseline 02/24/2021: 55; 5/18: 60; 7/7: 56 8/10: 56% 8/29: 52%    Time 12    Period Weeks    Status Not Met      PT LONG TERM GOAL #4   Title Patient will increase BUE MMT scores by at least 1/2 point as to improve functional strength and endurance for overhead activities and ADLs such as driving and washing her hair.    Baseline 02/24/2021: L/R shoulder flexion 4/5, 3+/5; shoulder abduction 4/5 B; shoulder extension 4+5 B; shoulder IR/ER both grossly 4/5 B; elbow flexion/extension 5/5 for both B; wrist flexion/extension 5/5 for both B; 5/18: L shoulder is grossly 4+/5.  R shoulder flexion 3+/5; R shoulder abduction 4-/5; R shoulder IR/ER both 4-/5 (B); R elbow flexion 4/5, elbow ext. 5/5 B, shoulder ext. 5/5 B; 7/7:  L shoulder grossly 4+/5, R shoulder 4/5 except shoulder flexion 4-/5, R shoulder IR/ER pain limited to 3/5 8/10: grossly 4/5 with IR/ER painful limiting full range 8/29: 4/5    Time 12    Period Weeks    Status Not Met      PT LONG TERM GOAL #5   Title Pt will demonstrate decrease in NDI by at least 19% in order to demonstrate clinically significant reduction in disability related to neck/TMJ injury/pain    Baseline 8/3: 22% 10/25: 14%    Time 12    Period Weeks    Status Achieved      PT LONG TERM GOAL #6   Title Patient will report decreased worst VAS of jaw to <3/10 for decreased pain with eating and improved quality of life    Baseline 8/3: 6/10 10/25: 6/10    Time 12    Period Weeks    Status On-going    Target Date 10/21/21      PT LONG TERM GOAL #7   Title Patient will report little to no clicking/catching with chewing for improved quality of life and ADL performance     Baseline 8/3: audible clicking/catching 73/53: >75% better    Time 12    Period Weeks    Status Achieved                   Plan - 10/13/21 1404     Clinical Impression Statement Patient's goals assessed with patient reporting feeling ~75% better with pain occurring less frequently and more combined with migraines at this time. Her clicking has resolved. Neck pain is improving as well as range of available motion. Next session will be last session and patient is agreeable to discharge. Patient's condition has the potential to improve in response to therapy. Maximum improvement is yet to be obtained. The anticipated improvement is attainable and reasonable in a generally predictable time.  Pt will benefit from skilled PT services to address deficits and return to pain-free function at home and with caregiving    Personal Factors and Comorbidities Comorbidity 1;Comorbidity 2;Comorbidity 3+;Past/Current Experience;Social Background;Time since onset of injury/illness/exacerbation;Transportation;Fitness;Other    Comorbidities Pertinent: recent hx of COVID19, HTN, hyperlipidemia, pre-diabetes, AFIB, CHF, depression, anxiety, OA, atypical migraine, asthma.    Examination-Activity Limitations Bathing;Bed  Mobility;Hygiene/Grooming;Lift;Caring for Others;Reach Overhead;Carry;Dressing;Sleep;Other;Self Feeding    Examination-Participation Restrictions Laundry;Cleaning;Driving;Community Activity;Other;Yard Work;Shop;Meal Prep;Volunteer    Stability/Clinical Decision Making Stable/Uncomplicated    Rehab Potential Good    PT Frequency 2x / week    PT Duration 12 weeks    PT Treatment/Interventions ADLs/Self Care Home Management;Biofeedback;Cryotherapy;Electrical Stimulation;Moist Heat;Traction;Ultrasound;DME Instruction;Functional mobility training;Therapeutic activities;Therapeutic exercise;Neuromuscular re-education;Patient/family education;Orthotic Fit/Training;Manual techniques;Scar  mobilization;Passive range of motion;Dry needling;Taping;Energy conservation;Joint Manipulations;Spinal Manipulations;Gait training;Visual/perceptual remediation/compensation    PT Next Visit Plan Reassess TMJ and symptoms after dental procedure on 09/14/21    PT Home Exercise Plan No changes at this visit.    Consulted and Agree with Plan of Care Patient             Patient will benefit from skilled therapeutic intervention in order to improve the following deficits and impairments:  Increased fascial restricitons, Improper body mechanics, Pain, Decreased mobility, Increased muscle spasms, Postural dysfunction, Decreased activity tolerance, Decreased endurance, Decreased range of motion, Decreased strength, Hypomobility, Impaired UE functional use, Impaired flexibility, Decreased coordination, Impaired tone  Visit Diagnosis: Muscle weakness (generalized)  Cervicalgia  Abnormal posture     Problem List Patient Active Problem List   Diagnosis Date Noted   Medicare annual wellness visit, initial 07/17/2021   TMJ (dislocation of temporomandibular joint) 07/17/2021   Encounter for screening mammogram for breast cancer 06/05/2021   Diarrhea 04/11/2020   Epigastric pain 02/19/2020   Dark stools 02/19/2020   PAF (paroxysmal atrial fibrillation) ([place]) 02/05/2019   Shortness of breath 01/27/2019   Irregular surface of cornea 12/25/2018   HPV (human papilloma virus) infection 12/01/2016   Screening mammogram, encounter for 11/29/2016   Estrogen deficiency 11/29/2016   Encounter for routine gynecological examination 11/29/2016   Grade III hemorrhoids 10/06/2016   Tachycardia 08/14/2016   Hemorrhoids 08/13/2016   Atypical migraine 08/03/2016   CAD (coronary artery disease)    AAA (abdominal aortic aneurysm)    Chronic combined systolic (congestive) and diastolic (congestive) heart failure ([place])    Prediabetes 02/11/2016   Generalized anxiety disorder 12/08/2015   Panic attacks  07/28/2015   Sensorineural hearing loss 03/27/2013   Vitamin D deficiency 07/03/2012   H/O aortic dissection 05/12/2012   Routine general medical examination at a health care facility 09/20/2011   S/P AVR (aortic valve replacement) 01/07/2011   CORONARY ARTERY BYPASS GRAFT, HX OF 01/07/2011   Arthritis 12/20/2010   Back pain 12/15/2010   H/O dissecting abdominal aortic aneurysm repair 08/24/2010   IRRITABLE BOWEL SYNDROME 09/09/2009   Obstructive sleep apnea 08/04/2009   External hemorrhoid 06/26/2009   Osteoporosis 01/09/2009   Hyperlipidemia 07/26/2007   Depression with anxiety 07/26/2007   Essential hypertension 07/26/2007   Allergic rhinitis 07/26/2007   Asthma 07/26/2007   GERD 07/26/2007   Osteoarthritis 07/26/2007   Ocular migraine 07/26/2007   Mild intermittent asthma without complication 35/70/1779    Janna Arch, PT, DPT  10/13/2021, 2:07 PM  [place] MAIN [facility] SERVICES [address] [place], [place], [address] Phone: [phone]   Fax:  [phone]  Name: SHILPA BUSHEE MRN: [id number] Date of Birth: 01/26/1956

## 2021-10-15 ENCOUNTER — Ambulatory Visit: Payer: Medicare HMO

## 2021-10-20 ENCOUNTER — Telehealth: Payer: Self-pay

## 2021-10-20 NOTE — Progress Notes (Signed)
Chronic Care Management Pharmacy Assistant   Name: Rachel Torres  MRN: [id number] DOB: [birth year]  Reason for Encounter: CCM (Hypertension Disease State)   Recent office visits:  None since last CCM contact  Recent consult visits:  10/13/2021 - Physical Therapy - Patient presented for muscle weakness.  10/08/2021 - Family Medicine - Patient presented for discussion of her progress with her obesity treatment plan along with follow-up of her obesity related diagnoses. 10/04/2021 - Physical Therapy - Patient presented for muscle weakness.  09/30/2021 - Physical Therapy - Patient presented for muscle weakness.  09/23/2021 - Physical Therapy - Patient presented for muscle weakness.  09/16/2021 - Physical Therapy - Patient presented for muscle weakness.  09/09/2021 - Family Medicine - Patient presented for discussion of her progress with her obesity treatment plan along with follow-up of her obesity related diagnoses. Change: Semaglutide, 1 MG/DOSE, 4 MG/3ML SOPN - Inject 1 mg as directed once a week. 09/07/2021 - Physical Therapy - Patient presented for muscle weakness.   Hospital visits:  None in previous 6 months  Medications: Outpatient Encounter Medications as of 10/20/2021  Medication Sig   acetaminophen (TYLENOL) 500 MG tablet Take 500 mg by mouth every 6 (six) hours as needed.   albuterol (VENTOLIN HFA) 108 (90 Base) MCG/ACT inhaler Inhale 1-2 puffs into the lungs every 6 (six) hours as needed for wheezing or shortness of breath.   ALPRAZolam (XANAX) 0.5 MG tablet Take 1 tablet (0.5 mg total) by mouth daily as needed for anxiety.   aspirin EC 81 MG tablet Take 81 mg by mouth at bedtime.   atorvastatin (LIPITOR) 20 MG tablet Take 1 tablet (20 mg total) by mouth daily. Time to schedule your yearly appt with Dr. [name] in Nov. For continued refills   carvedilol (COREG) 6.25 MG tablet TAKE 1 TABLET TWICE DAILY WITH A MEAL   Cholecalciferol (VITAMIN D3) 50 MCG (2000 UT) CAPS Take  2,000 capsules by mouth.   clindamycin (CLEOCIN) 300 MG capsule    cyclobenzaprine (FLEXERIL) 10 MG tablet Take 0.5-1 tablets (5-10 mg total) by mouth 3 (three) times daily as needed. For headache   furosemide (LASIX) 20 MG tablet TAKE 1 TABLET DAILY. MAY TAKE ADDITIONAL TABLET IN AFTERNOON AS NEEDED FOR SWELLING, SHORTNESS OF BREATH, OR WEIGHT GAIN.   Insulin Pen Needle (BD PEN NEEDLE NANO U/F) 32G X 4 MM MISC Use Nano needle with Ozempic   Krill Oil (OMEGA-3) 500 MG CAPS Take 1 capsule by mouth daily.   lisinopril (ZESTRIL) 5 MG tablet TAKE 1/2 TABLET EVERY DAY. Time to schedule your yearly appt with Dr. [name] in Nov. For continued refills   magnesium gluconate (MAGONATE) 500 MG tablet Take 500 mg by mouth at bedtime.   Melatonin 5 MG SUBL Place under the tongue.   Misc Natural Products (TART CHERRY ADVANCED) CAPS Take 1 capsule by mouth daily.   naproxen sodium (ANAPROX) 220 MG tablet Take 440 mg by mouth 2 (two) times daily as needed (for headaches/pain).   nystatin (MYCOSTATIN/NYSTOP) powder Apply 1 application topically 2 (two) times daily. Apply to affected area twice a day for 14 days   omeprazole (PRILOSEC) 40 MG capsule Take 1 capsule (40 mg total) by mouth daily.   polyethylene glycol powder (GLYCOLAX/MIRALAX) 17 GM/SCOOP powder Take 17 g by mouth as needed.   potassium chloride (KLOR-CON) 10 MEQ tablet TAKE 1 TABLET TWICE A DAY AS NEEDED. TAKE ADDITIONAL TABLET WHEN INCREASING FUROSEMIDE   promethazine (PHENERGAN) 25 MG tablet Take 0.5  tablets (12.5 mg total) by mouth every 8 (eight) hours as needed for nausea.   Semaglutide, 1 MG/DOSE, 4 MG/3ML SOPN Inject 1 mg as directed once a week.   Simethicone (MYLANTA GAS PO) Take by mouth 3 times/day as needed-between meals & bedtime.   venlafaxine XR (EFFEXOR-XR) 150 MG 24 hr capsule TAKE 1 CAPSULE EVERY DAY WITH BREAKFAST   No facility-administered encounter medications on file as of 10/20/2021.    Recent Office Vitals: BP Readings from  Last 3 Encounters:  10/08/21 122/72  09/09/21 111/66  09/01/21 138/74   Pulse Readings from Last 3 Encounters:  10/08/21 73  09/09/21 75  09/01/21 80    Wt Readings from Last 3 Encounters:  10/08/21 164 lb (74.4 kg)  09/09/21 168 lb (76.2 kg)  09/01/21 175 lb (79.4 kg)     Kidney Function Lab Results  Component Value Date/Time   CREATININE 0.80 08/13/2021 11:48 AM   CREATININE 0.89 07/10/2021 08:10 AM   CREATININE 0.94 06/20/2013 08:15 AM   CREATININE 0.96 05/12/2012 12:12 PM   CREATININE 0.88 04/09/2011 09:00 AM   GFR 68.85 07/10/2021 08:10 AM   GFRNONAA 68 07/23/2020 03:33 PM   GFRNONAA >60 06/20/2013 08:15 AM   GFRAA 78 07/23/2020 03:33 PM   GFRAA >60 06/20/2013 08:15 AM    BMP Latest Ref Rng & Units 08/13/2021 07/10/2021 02/26/2021  Glucose 70 - 99 mg/dL - 95 97  BUN 6 - 23 mg/dL - 14 16  Creatinine 0.44 - 1.00 mg/dL 0.80 0.89 0.91  BUN/Creat Ratio 12 - 28 - - 18  Sodium 135 - 145 mEq/L - 135 137  Potassium 3.5 - 5.1 mEq/L - 4.8 4.7  Chloride 96 - 112 mEq/L - 99 100  CO2 19 - 32 mEq/L - 30 21  Calcium 8.4 - 10.5 mg/dL - 9.5 9.5   Attempted contact with patient 3 times on 11/01, 11/09 and 11/10. Unsuccessful outreach. Will attempt contact next month.   Current antihypertensive regimen:  Carvedilol 6.25 mg - 1 tablet twice daily with meal Lisinopril 5 mg - 1/2 tablet daily  Adherence Review: Is the patient currently on ACE/ARB medication? Yes Does the patient have >5 day gap between last estimated fill dates? Yes - Ozempic last filled on 08/18/21 for 28 days.  Star Rating Drugs:  Medication:  Last Fill: Day Supply Ozempic   08/18/2021 28 Lisinopril 5mg   08/19/2021 90 Atorvastatin 20mg        08/19/2021 90  Care Gaps: Annual wellness visit in last year? Yes Most Recent BP reading: 122/72 on 10/08/2021  PCP appointment on 10/29/21 for AWV  by Telephone  Debbora Dus, CPP notified  [name], [place] [phone]  Time Spent:  21 - other patient time

## 2021-10-21 ENCOUNTER — Other Ambulatory Visit: Payer: Self-pay

## 2021-10-21 ENCOUNTER — Ambulatory Visit: Payer: Medicare HMO | Attending: Family Medicine

## 2021-10-21 ENCOUNTER — Other Ambulatory Visit: Payer: Medicare HMO

## 2021-10-21 ENCOUNTER — Telehealth: Payer: Self-pay | Admitting: Cardiovascular Disease

## 2021-10-21 DIAGNOSIS — M6281 Muscle weakness (generalized): Secondary | ICD-10-CM | POA: Diagnosis not present

## 2021-10-21 DIAGNOSIS — R293 Abnormal posture: Secondary | ICD-10-CM | POA: Diagnosis not present

## 2021-10-21 DIAGNOSIS — M542 Cervicalgia: Secondary | ICD-10-CM | POA: Diagnosis not present

## 2021-10-21 NOTE — Therapy (Signed)
[place] MAIN [facility] SERVICES [address] [place], [place], [address] Phone: [phone]   Fax:  [phone]  Physical Therapy Treatment/ discharge   Patient Details  Name: Rachel Torres MRN: [id number] Date of Birth: 01-05-1956 Referring Provider (PT): Gregor Hams, MD   Encounter Date: 10/21/2021   PT End of Session - 10/21/21 1025     Visit Number 41    Number of Visits 3    Date for PT Re-Evaluation 10/21/21    Authorization Type [employer] Primary; [employer] Medicare Secondary    Authorization Time Period 07/29/21-10/21/21    Progress Note Due on Visit 40    PT Start Time 1015    PT Stop Time 1059    PT Time Calculation (min) 44 min    Activity Tolerance Patient tolerated treatment well;No increased pain    Behavior During Therapy WFL for tasks assessed/performed             Past Medical History:  Diagnosis Date   AAA (abdominal aortic aneurysm)    a. Chronic w/o evidence of aneurysmal dil on CTA 01/2016.   Alcohol abuse    Allergy    Anemia    Anxiety    Asthma    CAD (coronary artery disease)    a. 08/2010 s/p CABG x 1 (VG->RCA) @ time of Ao dissection repair; b. 01/2016 Lexiscan MV: mid antsept/apical defect w/ ? peri-infarct ischemia-->likely attenuation-->Med Rx.   Chewing difficulty    Chronic combined systolic (congestive) and diastolic (congestive) heart failure ([place])    a. 2012 EF 30-35%; b. 04/2015 EF 40-45%; c. 01/2016 Echo: Ef 55-65%, nl AoV bioprosthesis, nl RV, nl PASP.   Constipation    Depression    Edema of both lower extremities    Gallbladder sludge    GERD (gastroesophageal reflux disease)    Heart failure (HCC)    Heart valve problem    History of Bicuspid Aortic Valve    a. 08/2010 s/p AVR @ time of Ao dissection repair; b. 01/2016 Echo: Ef 55-65%, nl AoV bioprosthesis, nl RV, nl PASP.   Hx of repair of dissecting thoracic aortic aneurysm, Stanford type A    a. 08/2010 s/p repair with AVR and  VG->RCA; b. 01/2016 CTA: stable appearance of Asc Thoracic Aortic graft. Opacification of flase lumen of chronic abd Ao dissection w/ retrograde flow through lumbar arteries. No aneurysmal dil of flase lumen.   Hyperlipidemia    Hypertension    Hypertensive heart disease    IBS (irritable bowel syndrome)    Internal hemorrhoids    Kidney problem    Marfan syndrome    pt denies   Migraine    Obstructive sleep apnea    Osteoarthritis    Osteoporosis    Palpitations    Pneumonia    RA (rheumatoid arthritis) ([place])    a. Followed by Dr. [name]    Past Surgical History:  Procedure Laterality Date   ABDOMINAL AORTIC ANEURYSM REPAIR     CARDIAC CATHETERIZATION  2001   [place]     ESOPHAGOGASTRODUODENOSCOPY (EGD) WITH PROPOFOL N/A 05/08/2020   Procedure: ESOPHAGOGASTRODUODENOSCOPY (EGD) WITH PROPOFOL;  Surgeon: Virgel Manifold, MD;  Location: [facility] ENDOSCOPY;  Service: Endoscopy;  Laterality: N/A;   FRACTURE SURGERY Right    shoulder replacement   HEMORRHOID BANDING     ORIF DISTAL RADIUS FRACTURE Left    UPPER GASTROINTESTINAL  ENDOSCOPY      There were no vitals filed for this visit.   Subjective Assessment - 10/21/21 1024     Subjective Patient had her crown filling on Friday. Is aware today is her last session.    Pertinent History Pt is a 64 y/o female presenting to therapy due to R shoulder pain. Pt had R shoulder replacement 12-13 years ago.  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 She says shoulder pain started this past January as she was helping lift her husband and his assistive devices. She reports she has since stopped lifting him and his walker as they have caregivers who come to the home for 16 hours/week and can help. Pt is most concerned that her shoulder hurts when she drives and washes her hair. Her shoulder does not hurt constantly, but she states she has pain  "the more I use it." She feels a catch and stabbing sensation in R anterior shoulder that can travel into bicep and that sometimes the pain is a burning sensation. She sometimes has pain in L shoulder.  She rates her worst R shoulder pain as 10/10, best pain is 0/10. Her R shoulder pain is currently 1.5/10. She reports that rest, ice and naproxen help with pain relief. She denies redness/swelling/temp difference and N/T in UEs.  Patient has been seen by other therapists in this clinic previously for cervicalgia and back related injuries/pain. Pt PMH includes COVID19, upper GI stomach issues, aorta type A dissection (2011) with aortic valve replacement, bypass of R coronary artery, HTN, hyperlipidemia, pre-diabetes, AFIB, CHF, depression, anxiety, OA, GERD, atypical migraine, asthma, and allergic rhinitis. Additional diagnosis of TMJ has been added with patient reporting pain has been occuring for ~40 years, worsens with stress and grinding of teeth.    Limitations Lifting;House hold activities;Other (comment)    How long can you sit comfortably? Not affected    How long can you stand comfortably? Not affected    Currently in Pain? Yes    Pain Score 2     Pain Location Jaw    Pain Orientation Right;Left    Pain Descriptors / Indicators Aching    Pain Type Chronic pain    Pain Onset More than a month ago    Pain Frequency Intermittent                 Patient reports intermittent L arm pins and needles, will go to cardiologist after discharge session today.       Manual:  -supine cervical mobilizations with PA mobilizations grade II x 6 minutes ; specific focus to L C2-C3 UPA for additional 3 minutes  -suboccipital release 2x30 second holds  -J mobilization 30 seconds x 2 trials Grade II thoracic mobilizations and rib inferior mobilizations for postural correction x5 minutes  -inferior mandibular mobilization 10x 10 second holds; decreased clicking by end of mobilization   -cervical  rotation with overpressure at [facility] joint 2x30 second holds -cervical side bend with overpressure at [facility] joint at 2x30 second holds  Therex  Towel: lateral SNAG 5x 15 second holds each LE Upper trap stretch 2x 30 seconds each side        Trigger Point Dry Needling (TDN), unbilled Education performed with patient regarding potential benefit of TDN. Reviewed precautions and risks with patient. Reviewed special precautions/risks over lung fields which include pneumothorax. Reviewed signs and symptoms of pneumothorax and advised pt to go to ER immediately if  these symptoms develop advise them of dry needling treatment. Extensive time spent with pt to ensure full understanding of TDN risks. Pt provided verbal consent to treatment. TDN performed to  with 0.20 x .30 single needle placements with local twitch response (LTR). Pistoning technique utilized. Improved pain-free motion following intervention. Musculature addressed is as followed: masseter bilateral, pterygoid bilateral, temporal region bilateral, R upper trap. X 15 minutes        Pt educated throughout session about proper posture and technique with exercises. Improved exercise technique, movement at target joints, use of target muscles after min to mod verbal, visual, tactile cues      Patient is aware today is last session for PT and is agreeable at this time. She is now able to open and close jaw without popping, clicking, or pain. Patient is primarily self sufficient with HEP and understands how to control flare ups now that her crowns are in place and all dental work is finalized. I will be happy to see patient again in the future as needed.                      PT Education - 10/21/21 1012     Education Details exercise technique, body mechanics, discharge    Person(s) Educated Patient    Methods Explanation;Demonstration;Tactile cues;Verbal cues    Comprehension Verbalized understanding;Returned demonstration;Verbal  cues required;Tactile cues required              PT Short Term Goals - 07/29/21 1445       PT SHORT TERM GOAL #1   Title Patient will be independent in home exercise program to improve strength/mobility for better functional independence with ADLs.    Baseline 02/24/2021 Pt issued HEP; 5/19: HEP to be advanced, pt has been unable to perform HEP consistently d/t spouse surgery and son's wedding.; 7/7: pt reports she has not been performing HEP "as often as I'm supposed to." 8/10: HEP compliant    Time 6    Period Weeks    Status Achieved    Target Date 08/06/21               PT Long Term Goals - 10/13/21 1402       PT LONG TERM GOAL #1   Title Patient will report a worst pain of 3/10  in last 7 days in her R shoulder to improve tolerance with ADLs and reduced symptoms with activities.    Baseline 02/24/2021: Pt reports worst pain as 10/10.; 5/18: 4/10 is worst pain; 7/7: pt reports worst pain 5/10 8/10: 7/10 pain 8/29: 7/10    Time 12    Period Weeks    Status Not Met      PT LONG TERM GOAL #2   Title Patient will decrease Quick DASH scores by > 8 points demonstrating reduced self-reported upper extremity disability.    Baseline 02/24/2021: QuickDash Disability/symptom score 40, Work score 37.5; 5/18: Disability/symtpom 38.6, Work 43.75; 7/7: disability/sx score 50%, work 62.5% 8/10: 37.7% caregiver work 37.5% 8/29: 52.3%, caregiving/work 56%    Time 12    Period Weeks    Status Not Met      PT LONG TERM GOAL #3   Title Patient will increase FOTO score to equal to or greater than 60 to demonstrate statistically significant improvement in mobility and quality of life.    Baseline 02/24/2021: 55; 5/18: 60; 7/7: 56 8/10: 56% 8/29: 52%    Time 12    Period  Weeks    Status Not Met      PT LONG TERM GOAL #4   Title Patient will increase BUE MMT scores by at least 1/2 point as to improve functional strength and endurance for overhead activities and ADLs such as driving and washing  her hair.    Baseline 02/24/2021: L/R shoulder flexion 4/5, 3+/5; shoulder abduction 4/5 B; shoulder extension 4+5 B; shoulder IR/ER both grossly 4/5 B; elbow flexion/extension 5/5 for both B; wrist flexion/extension 5/5 for both B; 5/18: L shoulder is grossly 4+/5.  R shoulder flexion 3+/5; R shoulder abduction 4-/5; R shoulder IR/ER both 4-/5 (B); R elbow flexion 4/5, elbow ext. 5/5 B, shoulder ext. 5/5 B; 7/7:  L shoulder grossly 4+/5, R shoulder 4/5 except shoulder flexion 4-/5, R shoulder IR/ER pain limited to 3/5 8/10: grossly 4/5 with IR/ER painful limiting full range 8/29: 4/5    Time 12    Period Weeks    Status Not Met      PT LONG TERM GOAL #5   Title Pt will demonstrate decrease in NDI by at least 19% in order to demonstrate clinically significant reduction in disability related to neck/TMJ injury/pain    Baseline 8/3: 22% 10/25: 14%    Time 12    Period Weeks    Status Achieved      PT LONG TERM GOAL #6   Title Patient will report decreased worst VAS of jaw to <3/10 for decreased pain with eating and improved quality of life    Baseline 8/3: 6/10 10/25: 6/10    Time 12    Period Weeks    Status On-going    Target Date 10/21/21      PT LONG TERM GOAL #7   Title Patient will report little to no clicking/catching with chewing for improved quality of life and ADL performance    Baseline 8/3: audible clicking/catching 02/40: >75% better    Time 12    Period Weeks    Status Achieved                   Plan - 10/21/21 1027     Clinical Impression Statement Patient is aware today is last session for PT and is agreeable at this time. She is now able to open and close jaw without popping, clicking, or pain. Patient is primarily self sufficient with HEP and understands how to control flare ups now that her crowns are in place and all dental work is finalized. I will be happy to see patient again in the future as needed.    Personal Factors and Comorbidities Comorbidity  1;Comorbidity 2;Comorbidity 3+;Past/Current Experience;Social Background;Time since onset of injury/illness/exacerbation;Transportation;Fitness;Other    Comorbidities Pertinent: recent hx of COVID19, HTN, hyperlipidemia, pre-diabetes, AFIB, CHF, depression, anxiety, OA, atypical migraine, asthma.    Examination-Activity Limitations Bathing;Bed Mobility;Hygiene/Grooming;Lift;Caring for Others;Reach Overhead;Carry;Dressing;Sleep;Other;Self Feeding    Examination-Participation Restrictions Laundry;Cleaning;Driving;Community Activity;Other;Yard Work;Shop;Meal Prep;Volunteer    Stability/Clinical Decision Making Stable/Uncomplicated    Rehab Potential Good    PT Frequency 2x / week    PT Duration 12 weeks    PT Treatment/Interventions ADLs/Self Care Home Management;Biofeedback;Cryotherapy;Electrical Stimulation;Moist Heat;Traction;Ultrasound;DME Instruction;Functional mobility training;Therapeutic activities;Therapeutic exercise;Neuromuscular re-education;Patient/family education;Orthotic Fit/Training;Manual techniques;Scar mobilization;Passive range of motion;Dry needling;Taping;Energy conservation;Joint Manipulations;Spinal Manipulations;Gait training;Visual/perceptual remediation/compensation    PT Home Exercise Plan No changes at this visit.    Consulted and Agree with Plan of Care Patient             Patient will benefit from skilled therapeutic intervention in  order to improve the following deficits and impairments:  Increased fascial restricitons, Improper body mechanics, Pain, Decreased mobility, Increased muscle spasms, Postural dysfunction, Decreased activity tolerance, Decreased endurance, Decreased range of motion, Decreased strength, Hypomobility, Impaired UE functional use, Impaired flexibility, Decreased coordination, Impaired tone  Visit Diagnosis: Muscle weakness (generalized)  Cervicalgia  Abnormal posture     Problem List Patient Active Problem List   Diagnosis Date  Noted   Medicare annual wellness visit, initial 07/17/2021   TMJ (dislocation of temporomandibular joint) 07/17/2021   Encounter for screening mammogram for breast cancer 06/05/2021   Diarrhea 04/11/2020   Epigastric pain 02/19/2020   Dark stools 02/19/2020   PAF (paroxysmal atrial fibrillation) ([place]) 02/05/2019   Shortness of breath 01/27/2019   Irregular surface of cornea 12/25/2018   HPV (human papilloma virus) infection 12/01/2016   Screening mammogram, encounter for 11/29/2016   Estrogen deficiency 11/29/2016   Encounter for routine gynecological examination 11/29/2016   Grade III hemorrhoids 10/06/2016   Tachycardia 08/14/2016   Hemorrhoids 08/13/2016   Atypical migraine 08/03/2016   CAD (coronary artery disease)    AAA (abdominal aortic aneurysm)    Chronic combined systolic (congestive) and diastolic (congestive) heart failure ([place])    Prediabetes 02/11/2016   Generalized anxiety disorder 12/08/2015   Panic attacks 07/28/2015   Sensorineural hearing loss 03/27/2013   Vitamin D deficiency 07/03/2012   H/O aortic dissection 05/12/2012   Routine general medical examination at a health care facility 09/20/2011   S/P AVR (aortic valve replacement) 01/07/2011   CORONARY ARTERY BYPASS GRAFT, HX OF 01/07/2011   Arthritis 12/20/2010   Back pain 12/15/2010   H/O dissecting abdominal aortic aneurysm repair 08/24/2010   IRRITABLE BOWEL SYNDROME 09/09/2009   Obstructive sleep apnea 08/04/2009   External hemorrhoid 06/26/2009   Osteoporosis 01/09/2009   Hyperlipidemia 07/26/2007   Depression with anxiety 07/26/2007   Essential hypertension 07/26/2007   Allergic rhinitis 07/26/2007   Asthma 07/26/2007   GERD 07/26/2007   Osteoarthritis 07/26/2007   Ocular migraine 07/26/2007   Mild intermittent asthma without complication 43/83/7793    Janna Arch, PT, DPT  10/21/2021, 11:02 AM  [place] MAIN [name] SERVICES [address]  [place], [place], [address] Phone: [phone]   Fax:  [phone]  Name: Rachel Torres MRN: [id number] Date of Birth: 11/07/1956

## 2021-10-21 NOTE — Telephone Encounter (Signed)
Attempted to reach back out to pt, LDM on VM (DPR approved)  Advised glitch in ordering monitor with [employer] to have send out, have re-register monitor and it should be sent out today to her home address, should arrive in next few days. Sorry for the delay.  May keep appt next week regarding her symptoms of arm numbness and lightheadedness. However, with those symptoms and if CP develops, may need to be seen in ED before next week appt to be evaluated with cardiac work-up.  Last appt with Dr. [name] was 10/28/2020 Was waiting on appt with Dr. [name] for after monitor wear, may need to keep the 11/14 appt d/t her current symptoms. Or if better or seeks the ED for her s/s, may push back to after results.  Advised to call the office back with further questions.

## 2021-10-21 NOTE — Telephone Encounter (Signed)
Patient came by office  States that she never received her heart monitor  Patient also has concern about numbness in arm and lightheadedness - no other symptoms Please call to discuss

## 2021-10-23 DIAGNOSIS — I498 Other specified cardiac arrhythmias: Secondary | ICD-10-CM | POA: Diagnosis not present

## 2021-10-23 DIAGNOSIS — I480 Paroxysmal atrial fibrillation: Secondary | ICD-10-CM | POA: Diagnosis not present

## 2021-10-25 NOTE — Progress Notes (Signed)
Subjective:   Rachel Torres is a 65 y.o. female who presents for Medicare Annual (Subsequent) preventive examination.  I connected with Rachel Torres today by telephone and verified that I am speaking with the correct person using two identifiers. Location patient: home Location provider: work Persons participating in the virtual visit: patient, Marine scientist.    I discussed the limitations, risks, security and privacy concerns of performing an evaluation and management service by telephone and the availability of in person appointments. I also discussed with the patient that there may be a patient responsible charge related to this service. The patient expressed understanding and verbally consented to this telephonic visit.    Interactive audio and video telecommunications were attempted between this provider and patient, however failed, due to patient having technical difficulties OR patient did not have access to video capability.  We continued and completed visit with audio only.  Some vital signs may be absent or patient reported.   Time Spent with patient on telephone encounter: 20 minutes  Review of Systems     Cardiac Risk Factors include: advanced age (>11men, >70 women);dyslipidemia;hypertension     Objective:    Today's Vitals   10/29/21 0855 10/29/21 0856  Weight: 164 lb (74.4 kg)   Height: 5\' 5"  (1.651 m)   PainSc:  2    Body mass index is 27.29 kg/m.  Advanced Directives 10/29/2021 02/24/2021 06/03/2020 05/14/2020 05/08/2020 10/04/2019 06/05/2019  Does Patient Have a Medical Advance Directive? Yes Yes Yes Yes Yes Yes No  Type of Paramedic of [place];Living will Living will;Healthcare Power of [place];Living will Living will;Healthcare Power of Attorney Living will;Healthcare Power of Attorney - -  Does patient want to make changes to medical advance directive? Yes (MAU/Ambulatory/Procedural Areas - Information given)  - - Yes (Inpatient - patient requests chaplain consult to change a medical advance directive) - No - Patient declined No - Patient declined  Copy of [place] in Chart? - - - - No - copy requested - -  Would patient like information on creating a medical advance directive? - - - - - - No - Patient declined    Current Medications (verified) Outpatient Encounter Medications as of 10/29/2021  Medication Sig   acetaminophen (TYLENOL) 500 MG tablet Take 500 mg by mouth every 6 (six) hours as needed.   albuterol (VENTOLIN HFA) 108 (90 Base) MCG/ACT inhaler Inhale 1-2 puffs into the lungs every 6 (six) hours as needed for wheezing or shortness of breath.   ALPRAZolam (XANAX) 0.5 MG tablet Take 1 tablet (0.5 mg total) by mouth daily as needed for anxiety.   aspirin EC 81 MG tablet Take 81 mg by mouth at bedtime.   atorvastatin (LIPITOR) 20 MG tablet Take 1 tablet (20 mg total) by mouth daily. Time to schedule your yearly appt with Dr. [name] in Nov. For continued refills   carvedilol (COREG) 6.25 MG tablet TAKE 1 TABLET TWICE DAILY WITH A MEAL   Cholecalciferol (VITAMIN D3) 50 MCG (2000 UT) CAPS Take 2,000 capsules by mouth.   clindamycin (CLEOCIN) 300 MG capsule    cyclobenzaprine (FLEXERIL) 10 MG tablet Take 0.5-1 tablets (5-10 mg total) by mouth 3 (three) times daily as needed. For headache   furosemide (LASIX) 20 MG tablet TAKE 1 TABLET DAILY. MAY TAKE ADDITIONAL TABLET IN AFTERNOON AS NEEDED FOR SWELLING, SHORTNESS OF BREATH, OR WEIGHT GAIN.   Insulin Pen Needle (BD PEN NEEDLE NANO U/F) 32G X 4  MM MISC Use Nano needle with Ozempic   Krill Oil (OMEGA-3) 500 MG CAPS Take 1 capsule by mouth daily.   lisinopril (ZESTRIL) 5 MG tablet TAKE 1/2 TABLET EVERY DAY. Time to schedule your yearly appt with Dr. [name] in Nov. For continued refills   magnesium gluconate (MAGONATE) 500 MG tablet Take 500 mg by mouth at bedtime.   Melatonin 5 MG SUBL Place under the tongue.   Misc Natural  Products (TART CHERRY ADVANCED) CAPS Take 1 capsule by mouth daily.   naproxen sodium (ANAPROX) 220 MG tablet Take 440 mg by mouth 2 (two) times daily as needed (for headaches/pain).   nystatin (MYCOSTATIN/NYSTOP) powder Apply 1 application topically 2 (two) times daily. Apply to affected area twice a day for 14 days   omeprazole (PRILOSEC) 40 MG capsule Take 1 capsule (40 mg total) by mouth daily.   polyethylene glycol powder (GLYCOLAX/MIRALAX) 17 GM/SCOOP powder Take 17 g by mouth as needed.   potassium chloride (KLOR-CON) 10 MEQ tablet TAKE 1 TABLET TWICE A DAY AS NEEDED. TAKE ADDITIONAL TABLET WHEN INCREASING FUROSEMIDE   promethazine (PHENERGAN) 25 MG tablet Take 0.5 tablets (12.5 mg total) by mouth every 8 (eight) hours as needed for nausea.   Semaglutide, 1 MG/DOSE, 4 MG/3ML SOPN Inject 1 mg as directed once a week.   Simethicone (MYLANTA GAS PO) Take by mouth 3 times/day as needed-between meals & bedtime.   venlafaxine XR (EFFEXOR-XR) 150 MG 24 hr capsule TAKE 1 CAPSULE EVERY DAY WITH BREAKFAST   No facility-administered encounter medications on file as of 10/29/2021.    Allergies (verified) Fosamax [alendronate sodium], Ginzing [ginseng], Hydrocod polst-cpm polst er, Hydrocodone-acetaminophen, Imitrex [sumatriptan], Oxycodone, Peanut-containing drug products, Tramadol, Hydrocodone-guaifenesin, Prednisone, and Tape   History: Past Medical History:  Diagnosis Date   AAA (abdominal aortic aneurysm)    a. Chronic w/o evidence of aneurysmal dil on CTA 01/2016.   Alcohol abuse    Allergy    Anemia    Anxiety    Asthma    CAD (coronary artery disease)    a. 08/2010 s/p CABG x 1 (VG->RCA) @ time of Ao dissection repair; b. 01/2016 Lexiscan MV: mid antsept/apical defect w/ ? peri-infarct ischemia-->likely attenuation-->Med Rx.   Chewing difficulty    Chronic combined systolic (congestive) and diastolic (congestive) heart failure ([place])    a. 2012 EF 30-35%; b. 04/2015 EF 40-45%; c. 01/2016  Echo: Ef 55-65%, nl AoV bioprosthesis, nl RV, nl PASP.   Constipation    Depression    Edema of both lower extremities    Gallbladder sludge    GERD (gastroesophageal reflux disease)    Heart failure (HCC)    Heart valve problem    History of Bicuspid Aortic Valve    a. 08/2010 s/p AVR @ time of Ao dissection repair; b. 01/2016 Echo: Ef 55-65%, nl AoV bioprosthesis, nl RV, nl PASP.   Hx of repair of dissecting thoracic aortic aneurysm, [place] type A    a. 08/2010 s/p repair with AVR and VG->RCA; b. 01/2016 CTA: stable appearance of Asc Thoracic Aortic graft. Opacification of flase lumen of chronic abd Ao dissection w/ retrograde flow through lumbar arteries. No aneurysmal dil of flase lumen.   Hyperlipidemia    Hypertension    Hypertensive heart disease    IBS (irritable bowel syndrome)    Internal hemorrhoids    Kidney problem    Marfan syndrome    pt denies   Migraine    Obstructive sleep apnea    Osteoarthritis  Osteoporosis    Palpitations    Pneumonia    RA (rheumatoid arthritis) ([place])    a. Followed by Dr. [name]   Past Surgical History:  Procedure Laterality Date   ABDOMINAL AORTIC ANEURYSM REPAIR     CARDIAC CATHETERIZATION  2001   [place]     ESOPHAGOGASTRODUODENOSCOPY (EGD) WITH PROPOFOL N/A 05/08/2020   Procedure: ESOPHAGOGASTRODUODENOSCOPY (EGD) WITH PROPOFOL;  Surgeon: Virgel Manifold, MD;  Location: [facility] ENDOSCOPY;  Service: Endoscopy;  Laterality: N/A;   FRACTURE SURGERY Right    shoulder replacement   HEMORRHOID BANDING     ORIF DISTAL RADIUS FRACTURE Left    UPPER GASTROINTESTINAL ENDOSCOPY     Family History  Problem Relation Age of Onset   Hypertension Mother    Depression Mother    Osteoporosis Mother    Stroke Mother    Irritable bowel syndrome Mother    Asthma Mother    Heart disease Mother    Thyroid disease Mother    Anxiety disorder Mother    Arthritis Sister    Diabetes  Sister    Heart disease Sister    Asthma Sister    Migraines Sister    Nephrolithiasis Sister    Osteoporosis Sister    Arthritis Sister    Asthma Sister    Migraines Sister    Other Father 38   Stroke Maternal Grandmother    Colon cancer Maternal Grandmother    Lymphoma Maternal Grandmother    Migraines Maternal Grandmother    Aneurysm Paternal Grandmother        brain   Diabetes Paternal Grandmother    Arthritis Paternal Grandmother    Arthritis Paternal Grandfather    Diabetes Paternal Grandfather    Other Brother        prediabeties   Asthma Brother    Migraines Brother    Lung cancer Maternal Grandfather    Melanoma Maternal Grandfather    Social History   Socioeconomic History   Marital status: Married    Spouse name: [name]   Number of children: 2   Years of education: Not on file   Highest education level: Not on file  Occupational History   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    Employer: [employer]  Tobacco Use   Smoking status: Former    Packs/day: 0.75    Years: 1.50    Pack years: 1.13    Types: Cigarettes    Quit date: 12/20/1978    Years since quitting: 42.8   Smokeless tobacco: Never  Vaping Use   Vaping Use: Never used  Substance and Sexual Activity   Alcohol use: Yes    Alcohol/week: 0.0 standard drinks    Comment: 2-3 glasses of red wine per week   Drug use: No   Sexual activity: Never  Other Topics Concern   Not on file  Social History Narrative    married for the second time, happily   On disability after dissection of aortic aneurysm   Husband has Parkinson's - deteriorating w/ dementia   4-6 caffeinated beverages daily   01/25/2018         Social Determinants of Health   Financial Resource Strain: Low Risk    Difficulty of Paying Living Expenses: Not hard at all  Food Insecurity: No Food Insecurity   Worried About Charity fundraiser in the Last Year: Never true   Ran Out of Food in the Last Year: Never  true  Transportation Needs: No  Transportation Needs   Lack of Transportation (Medical): No   Lack of Transportation (Non-Medical): No  Physical Activity: Inactive   Days of Exercise per Week: 0 days   Minutes of Exercise per Session: 0 min  Stress: No Stress Concern Present   Feeling of Stress : Not at all  Social Connections: Socially Integrated   Frequency of Communication with Friends and Family: More than three times a week   Frequency of Social Gatherings with Friends and Family: Twice a week   Attends Religious Services: More than 4 times per year   Active Member of [employer] or Organizations: Yes   Attends Music therapist: More than 4 times per year   Marital Status: Married    Tobacco Counseling Counseling given: Not Answered   Clinical Intake:  Pre-visit preparation completed: Yes  Pain : 0-10 Pain Score: 2  Pain Location: Back     BMI - recorded: 27.29 Nutritional Status: BMI 25 -29 Overweight Nutritional Risks: None Diabetes: No  How often do you need to have someone help you when you read instructions, pamphlets, or other written materials from your doctor or pharmacy?: 1 - Never  Diabetic?No  Interpreter Needed?: No  Information entered by :: Orrin Brigham LPN   Activities of Daily Living In your present state of health, do you have any difficulty performing the following activities: 10/29/2021  Hearing? N  Vision? N  Difficulty concentrating or making decisions? N  Walking or climbing stairs? N  Dressing or bathing? N  Doing errands, shopping? N  Preparing Food and eating ? N  Using the Toilet? N  In the past six months, have you accidently leaked urine? N  Do you have problems with loss of bowel control? N  Managing your Medications? N  Managing your Finances? N  Housekeeping or managing your Housekeeping? N  Some recent data might be hidden    Patient Care Team: Tower, Wynelle Fanny, MD as PCP - General (Family Medicine) Debbora Dus, [facility] as Pharmacist  (Pharmacist) Starlyn Skeans, MD as Consulting Physician (Family Medicine)  Indicate any recent Medical Services you may have received from other than [facility] providers in the past year (date may be approximate).     Assessment:   This is a routine wellness examination for Rachel Torres.  Hearing/Vision screen Hearing Screening - Comments:: No issues, has ringing in both ears Vision Screening - Comments:: Last exam 2021, plans to make an appointment. [facility]  Dietary issues and exercise activities discussed: Current Exercise Habits: The patient does not participate in regular exercise at present   Goals Addressed             This Visit's Progress    Patient Stated       Would like to maintain current weight.       Depression Screen PHQ 2/9 Scores 10/29/2021 07/17/2021 05/05/2020 06/05/2019 12/07/2017 06/28/2017 07/05/2016  PHQ - 2 Score 0 0 4 2 0 1 0  PHQ- 9 Score - 4 12 9  0 12 -  Exception Documentation - - Medical reason - - - -    Fall Risk Fall Risk  10/29/2021 10/04/2019 06/05/2019 12/07/2017 07/05/2016  Falls in the past year? 0 1 1 No No  Number falls in past yr: 0 1 0 - -  Injury with Fall? 0 0 1 - -  Risk for fall due to : No Fall Risks History of fall(s);Other (Comment) - - -  Risk  for fall due to: Comment - Trips over things - - -  Follow up Falls prevention discussed Falls evaluation completed;Falls prevention discussed;Education provided - - -    FALL RISK PREVENTION PERTAINING TO THE HOME:  Any stairs in or around the home? Yes  If so, are there any without handrails? No  Home free of loose throw rugs in walkways, pet beds, electrical cords, etc? Yes  Adequate lighting in your home to reduce risk of falls? Yes   ASSISTIVE DEVICES UTILIZED TO PREVENT FALLS:  Life alert? No  Use of a cane, walker or w/c? No  Grab bars in the bathroom? Yes  Shower chair or bench in shower? Yes  Elevated toilet seat or a handicapped toilet? Yes   TIMED UP AND GO:  Was  the test performed? No , visit completed over the phone.   Cognitive Function: Normal cognitive status assessed by this Nurse Health Advisor. No abnormalities found.   MMSE - Mini Mental State Exam 06/05/2019 12/07/2017  Orientation to time 5 5  Orientation to Place 5 5  Registration 3 3  Attention/ Calculation 0 0  Recall 3 3  Language- name 2 objects 0 0  Language- repeat 1 1  Language- follow 3 step command 0 3  Language- read & follow direction 0 0  Write a sentence 0 0  Copy design 0 0  Total score 17 20        Immunizations Immunization History  Administered Date(s) Administered   H1N1 01/09/2009   Influenza Split 09/20/2011, 09/11/2012   Influenza Whole 12/29/2006, 09/19/2008, 10/08/2010   Influenza, Seasonal, Injecte, Preservative Fre 10/08/2010   Influenza,inj,Quad PF,6+ Mos 10/08/2013, 08/13/2016, 10/27/2017, 10/05/2018, 09/17/2019   Influenza-Unspecified 09/26/2015   PFIZER Comirnaty(Gray Top)Covid-19 Tri-Sucrose Vaccine 05/15/2021   PFIZER(Purple Top)SARS-COV-2 Vaccination 03/12/2020, 04/02/2020, 10/10/2020   Pneumococcal Polysaccharide-23 12/29/2006   Td 01/09/2009, 04/03/2019    TDAP status: Up to date  Flu Vaccine status: Up to date  Pneumococcal vaccine status: Up to date  Covid-19 vaccine status: Information provided on how to obtain vaccines.   Qualifies for Shingles Vaccine? Yes   Zostavax completed No   Shingrix Completed?: Yes  Screening Tests Health Maintenance  Topic Date Due   Zoster Vaccines- Shingrix (1 of 2) Never done   Pneumococcal Vaccine 99-3 Years old (2 - PCV) 12/30/2007   COVID-19 Vaccine (5 - Booster for Pfizer series) 07/10/2021   INFLUENZA VACCINE  07/20/2021   HIV Screening  01/22/2024 (Originally 10/17/1971)   COLONOSCOPY (Pts 45-41yrs Insurance coverage will need to be confirmed)  01/19/2023   MAMMOGRAM  08/07/2023   PAP SMEAR-Modifier  09/01/2024   TETANUS/TDAP  04/02/2029   Hepatitis C Screening  Completed   HPV  VACCINES  Aged Out    Health Maintenance  Health Maintenance Due  Topic Date Due   Zoster Vaccines- Shingrix (1 of 2) Never done   Pneumococcal Vaccine 54-66 Years old (2 - PCV) 12/30/2007   COVID-19 Vaccine (5 - Booster for Pfizer series) 07/10/2021   INFLUENZA VACCINE  07/20/2021    Colorectal cancer screening: Type of screening: Colonoscopy. Completed 01/19/13. Repeat every 10 years  Mammogram status: Completed 08/06/21. Repeat every year  Bone Density status: Completed 08/06/21. Results reflect: Bone density results: OSTEOPOROSIS. Repeat every 2 years.  Lung Cancer Screening: (Low Dose CT Chest recommended if Age 76-80 years, 30 pack-year currently smoking OR have quit w/in 15years.) does not qualify.     Additional Screening:  Hepatitis C Screening: does not qualify; Completed  11/24/17  Vision Screening: Recommended annual ophthalmology exams for early detection of glaucoma and other disorders of the eye. Is the patient up to date with their annual eye exam?  No , patient plans on making an appointment Who is the provider or what is the name of the office in which the patient attends annual eye exams? [facility] If pt is not established with a provider, would they like to be referred to a provider to establish care? No .   Dental Screening: Recommended annual dental exams for proper oral hygiene  Community Resource Referral / Chronic Care Management: CRR required this visit?  No   CCM required this visit?  No      Plan:     I have personally reviewed and noted the following in the patient's chart:   Medical and social history Use of alcohol, tobacco or illicit drugs  Current medications and supplements including opioid prescriptions.  Functional ability and status Nutritional status Physical activity Advanced directives List of other physicians Hospitalizations, surgeries, and ER visits in previous 12 months Vitals Screenings to include cognitive,  depression, and falls Referrals and appointments  In addition, I have reviewed and discussed with patient certain preventive protocols, quality metrics, and best practice recommendations. A written personalized care plan for preventive services as well as general preventive health recommendations were provided to patient.   Due to this being a telephonic visit, the after visit summary with patients personalized plan was offered to patient via mail or my-chart. Patient would like to access on my-chart.  Loma Messing, LPN   [id number]   Nurse Health Advisor  Nurse Notes: none

## 2021-10-27 ENCOUNTER — Ambulatory Visit: Payer: Medicare HMO

## 2021-10-29 ENCOUNTER — Ambulatory Visit (INDEPENDENT_AMBULATORY_CARE_PROVIDER_SITE_OTHER): Payer: Medicare HMO

## 2021-10-29 VITALS — Ht 65.0 in | Wt 164.0 lb

## 2021-10-29 DIAGNOSIS — Z0000 Encounter for general adult medical examination without abnormal findings: Secondary | ICD-10-CM

## 2021-10-29 NOTE — Patient Instructions (Signed)
Rachel Torres , Thank you for taking time to complete your Medicare Wellness Visit. I appreciate your ongoing commitment to your health goals. Please review the following plan we discussed and let me know if I can assist you in the future.   Screening recommendations/referrals: Colonoscopy: up to date, completed 01/19/13, Due 01/19/23 Mammogram: up to date , completed 08/06/21, Due 08/06/22  Bone Density: up to date, completed 08/06/21, Due 08/07/23 Recommended yearly ophthalmology/optometry visit for glaucoma screening and checkup Recommended yearly dental visit for hygiene and checkup  Vaccinations: Influenza vaccine: up to date, please bring vaccine information to your next visit Pneumococcal vaccine: up to date Tdap vaccine: up to date, completed 04/03/19, Due 04/02/29 Shingles vaccine: completed, please bring vaccine information to your next visit  Covid-19: newest booster available at your local pharmacy  Advanced directives: Please bring a copy of Living Will and/or [place] for your chart.   Conditions/risks identified: see problem list  Next appointment: Follow up in one year for your annual wellness visit.   Preventive Care 40-64 Years, Female Preventive care refers to lifestyle choices and visits with your health care provider that can promote health and wellness. What does preventive care include? A yearly physical exam. This is also called an annual well check. Dental exams once or twice a year. Routine eye exams. Ask your health care provider how often you should have your eyes checked. Personal lifestyle choices, including: Daily care of your teeth and gums. Regular physical activity. Eating a healthy diet. Avoiding tobacco and drug use. Limiting alcohol use. Practicing safe sex. Taking low-dose aspirin daily starting at age 52. Taking vitamin and mineral supplements as recommended by your health care provider. What happens during an annual well  check? The services and screenings done by your health care provider during your annual well check will depend on your age, overall health, lifestyle risk factors, and family history of disease. Counseling  Your health care provider may ask you questions about your: Alcohol use. Tobacco use. Drug use. Emotional well-being. Home and relationship well-being. Sexual activity. Eating habits. Work and work Statistician. Method of birth control. Menstrual cycle. Pregnancy history. Screening  You may have the following tests or measurements: Height, weight, and BMI. Blood pressure. Lipid and cholesterol levels. These may be checked every 5 years, or more frequently if you are over 50 years old. Skin check. Lung cancer screening. You may have this screening every year starting at age 7 if you have a 30-pack-year history of smoking and currently smoke or have quit within the past 15 years. Fecal occult blood test (FOBT) of the stool. You may have this test every year starting at age 56. Flexible sigmoidoscopy or colonoscopy. You may have a sigmoidoscopy every 5 years or a colonoscopy every 10 years starting at age 64. Hepatitis C blood test. Hepatitis B blood test. Sexually transmitted disease (STD) testing. Diabetes screening. This is done by checking your blood sugar (glucose) after you have not eaten for a while (fasting). You may have this done every 1-3 years. Mammogram. This may be done every 1-2 years. Talk to your health care provider about when you should start having regular mammograms. This may depend on whether you have a family history of breast cancer. BRCA-related cancer screening. This may be done if you have a family history of breast, ovarian, tubal, or peritoneal cancers. Pelvic exam and Pap test. This may be done every 3 years starting at age 71. Starting at age 22, this  may be done every 5 years if you have a Pap test in combination with an HPV test. Bone density scan. This  is done to screen for osteoporosis. You may have this scan if you are at high risk for osteoporosis. Discuss your test results, treatment options, and if necessary, the need for more tests with your health care provider. Vaccines  Your health care provider may recommend certain vaccines, such as: Influenza vaccine. This is recommended every year. Tetanus, diphtheria, and acellular pertussis (Tdap, Td) vaccine. You may need a Td booster every 10 years. Zoster vaccine. You may need this after age 70. Pneumococcal 13-valent conjugate (PCV13) vaccine. You may need this if you have certain conditions and were not previously vaccinated. Pneumococcal polysaccharide (PPSV23) vaccine. You may need one or two doses if you smoke cigarettes or if you have certain conditions. Talk to your health care provider about which screenings and vaccines you need and how often you need them. This information is not intended to replace advice given to you by your health care provider. Make sure you discuss any questions you have with your health care provider. Document Released: 01/02/2016 Document Revised: 08/25/2016 Document Reviewed: 10/07/2015 Elsevier Interactive Patient Education  2017 [place] Prevention in the Home Falls can cause injuries. They can happen to people of all ages. There are many things you can do to make your home safe and to help prevent falls. What can I do on the outside of my home? Regularly fix the edges of walkways and driveways and fix any cracks. Remove anything that might make you trip as you walk through a door, such as a raised step or threshold. Trim any bushes or trees on the path to your home. Use bright outdoor lighting. Clear any walking paths of anything that might make someone trip, such as rocks or tools. Regularly check to see if handrails are loose or broken. Make sure that both sides of any steps have handrails. Any raised decks and porches should have  guardrails on the edges. Have any leaves, snow, or ice cleared regularly. Use sand or salt on walking paths during winter. Clean up any spills in your garage right away. This includes oil or grease spills. What can I do in the bathroom? Use night lights. Install grab bars by the toilet and in the tub and shower. Do not use towel bars as grab bars. Use non-skid mats or decals in the tub or shower. If you need to sit down in the shower, use a plastic, non-slip stool. Keep the floor dry. Clean up any water that spills on the floor as soon as it happens. Remove soap buildup in the tub or shower regularly. Attach bath mats securely with double-sided non-slip rug tape. Do not have throw rugs and other things on the floor that can make you trip. What can I do in the bedroom? Use night lights. Make sure that you have a light by your bed that is easy to reach. Do not use any sheets or blankets that are too big for your bed. They should not hang down onto the floor. Have a firm chair that has side arms. You can use this for support while you get dressed. Do not have throw rugs and other things on the floor that can make you trip. What can I do in the kitchen? Clean up any spills right away. Avoid walking on wet floors. Keep items that you use a lot in easy-to-reach places.  If you need to reach something above you, use a strong step stool that has a grab bar. Keep electrical cords out of the way. Do not use floor polish or wax that makes floors slippery. If you must use wax, use non-skid floor wax. Do not have throw rugs and other things on the floor that can make you trip. What can I do with my stairs? Do not leave any items on the stairs. Make sure that there are handrails on both sides of the stairs and use them. Fix handrails that are broken or loose. Make sure that handrails are as long as the stairways. Check any carpeting to make sure that it is firmly attached to the stairs. Fix any carpet  that is loose or worn. Avoid having throw rugs at the top or bottom of the stairs. If you do have throw rugs, attach them to the floor with carpet tape. Make sure that you have a light switch at the top of the stairs and the bottom of the stairs. If you do not have them, ask someone to add them for you. What else can I do to help prevent falls? Wear shoes that: Do not have high heels. Have rubber bottoms. Are comfortable and fit you well. Are closed at the toe. Do not wear sandals. If you use a stepladder: Make sure that it is fully opened. Do not climb a closed stepladder. Make sure that both sides of the stepladder are locked into place. Ask someone to hold it for you, if possible. Clearly mark and make sure that you can see: Any grab bars or handrails. First and last steps. Where the edge of each step is. Use tools that help you move around (mobility aids) if they are needed. These include: Canes. Walkers. Scooters. Crutches. Turn on the lights when you go into a dark area. Replace any light bulbs as soon as they burn out. Set up your furniture so you have a clear path. Avoid moving your furniture around. If any of your floors are uneven, fix them. If there are any pets around you, be aware of where they are. Review your medicines with your doctor. Some medicines can make you feel dizzy. This can increase your chance of falling. Ask your doctor what other things that you can do to help prevent falls. This information is not intended to replace advice given to you by your health care provider. Make sure you discuss any questions you have with your health care provider. Document Released: 10/02/2009 Document Revised: 05/13/2016 Document Reviewed: 01/10/2015 Elsevier Interactive Patient Education  2017 [employer].

## 2021-11-01 NOTE — Progress Notes (Signed)
Cardiology Office Note  Date:  11/02/2021   ID:  Rachel Torres, DOB 09/06/1956, MRN [id number]  PCP:  [name], MD   Chief Complaint  Patient presents with   12 month follow up     Patient c/o dizziness and lightheaded. Patient is currently wearing a Zio monitor. Medications reviewed by the patient verbally.     HPI:  Rachel Torres is a 65 year-old woman with past medical history of  aorta type A dissection in September 2011   rushed to [facility] and underwent aortic valve replacement,  bypass of the right coronary artery with venous donor site from her right thigh,  aortic grafting for a extensive dissection of the  ascending aorta,  residual descending aortic dissection extending into the iliac artery,  EF 30 to 35,  up to 45 to 50% in August 2020 chronic back pain,  symptoms of shortness of breath,  abdominal pain,  hypertension  She reports pathology of her aortic valve showed bicuspid valve Chest pain episodes resolved by GI cocktail She presents today for follow-up of her aortic valve replacement and CABG.  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 Donnetta Hutching  Lab work reviewed HBA1c 5.6 Total chol 166, LDl 89  Denies any significant leg swelling PND orthopnea  EKG personally reviewed by myself on todays visit Shows normal sinus rhythm with rate 72 bpm nonspecific T wave abnormality  Other past medical history reviewed Echo 07/2019,  1. The left ventricle has mildly reduced systolic function, with an  ejection fraction of 45-50%. The cavity size was mildly dilated. Left  ventricular diastolic Doppler parameters are  consistent with  pseudonormalization. Left ventricular diffuse  hypokinesis.   2. The right ventricle has normal systolic function. The cavity was  normal. There is no increase in right ventricular wall thickness. Right  ventricular systolic pressure is mildly elevated with an estimated  pressure of 31.3 mmHg.   3. Left atrial size was mildly dilated.   4. A bioprosthesis valve is present in the aortic position. Procedure  Date: 08/2010 Normal aortic valve prosthesis. Mean gradient 5.5 mm Hg     Lab Results  Component Value Date   CHOL 166 07/10/2021   HDL 55.20 07/10/2021   LDLCALC 89 07/10/2021   TRIG 105.0 07/10/2021    Other past medical history reviewed Previous  30 day monitor ordered for palpitations,  APCs and PVCs   diagnosed with obstructive sleep apnea. She has a CPAP machine but she is not using it.   Previous evaluations in the emergency room for chest pain. Cardiac enzymes were negative , EKG unchanged .She had a CT scan dated 06/20/2013. She was transferred to [facility] given her complicated anatomy. images were evaluated by vascular surgery it was determined there have been no significant progression of her disease   Previous evaluation at [facility] for shortness of breath and chest discomfort. She had workup for cardiac issues including echocardiogram and PET stress, CTA of the chest showing stable descending aorta repair with type B. aortic dissection with opacification of the false lumen at the level of the renal arteries via a lumbar artery.  Echocardiogram showed ejection fraction had improved to 45-50%, mild MR, TR (ejection fraction improved from 30-35% in January 2012)   Previous H/o abdominal pain, further workup showing biliary stones and sludge. s/p  laparoscopic cholecystectomy at [facility].    PMH:   has a past medical history of AAA (abdominal aortic aneurysm), Alcohol abuse, Allergy, Anemia, Anxiety, Asthma, CAD (coronary artery disease), Chewing difficulty, Chronic  combined systolic (congestive) and diastolic (congestive) heart failure ([place]), Constipation, Depression, Edema of both lower extremities, Gallbladder sludge, GERD (gastroesophageal reflux disease), Heart failure ([place]), Heart valve problem, History of Bicuspid Aortic Valve, repair of dissecting thoracic aortic aneurysm, Stanford type A, Hyperlipidemia, Hypertension, Hypertensive heart disease, IBS (irritable bowel syndrome), Internal hemorrhoids, Kidney problem, Marfan syndrome, Migraine, Obstructive sleep apnea, Osteoarthritis, Osteoporosis, Palpitations, Pneumonia, and RA (rheumatoid arthritis) ([place]).  PSH:    Past Surgical History:  Procedure Laterality Date   ABDOMINAL AORTIC ANEURYSM REPAIR     CARDIAC CATHETERIZATION  2001   [place]     COLONOSCOPY     ESOPHAGOGASTRODUODENOSCOPY (EGD) WITH PROPOFOL N/A 05/08/2020   Procedure: ESOPHAGOGASTRODUODENOSCOPY (EGD) WITH PROPOFOL;  Surgeon: Virgel Manifold, MD;  Location: [facility] ENDOSCOPY;  Service: Endoscopy;  Laterality: N/A;   FRACTURE SURGERY Right    shoulder replacement   HEMORRHOID BANDING     ORIF DISTAL RADIUS FRACTURE Left    UPPER GASTROINTESTINAL ENDOSCOPY      Current Outpatient Medications  Medication Sig Dispense Refill   acetaminophen (TYLENOL) 500 MG tablet Take 500 mg by mouth every 6 (six) hours as needed.     albuterol (VENTOLIN HFA) 108 (90 Base) MCG/ACT inhaler Inhale 1-2 puffs into the lungs every 6 (six) hours as needed for wheezing or shortness of breath. 3 each 3   ALPRAZolam (XANAX) 0.5 MG tablet Take 1 tablet (0.5 mg total) by mouth daily as needed for anxiety. 30 tablet 0   aspirin EC 81 MG tablet Take 81 mg by mouth at bedtime.     Cholecalciferol (VITAMIN D3) 50 MCG (2000 UT) CAPS Take 2,000 capsules by mouth.     clindamycin (CLEOCIN) 300 MG capsule      cyclobenzaprine (FLEXERIL) 10 MG tablet Take 0.5-1 tablets (5-10 mg total) by mouth 3 (three) times daily as needed.  For headache 90 tablet 1   Insulin Pen Needle (BD PEN NEEDLE NANO U/F) 32G X 4 MM MISC Use Nano needle with Ozempic 100 each 0   Krill Oil (OMEGA-3) 500 MG CAPS Take 1 capsule by mouth daily.     magnesium gluconate (MAGONATE) 500 MG tablet Take 500 mg by mouth at bedtime.     Melatonin 5 MG SUBL Place under the tongue.     Misc Natural Products (TART CHERRY ADVANCED) CAPS Take 1 capsule by mouth daily.     naproxen sodium (ANAPROX) 220 MG tablet Take 440 mg by mouth 2 (two) times daily as needed (for headaches/pain).     nystatin (MYCOSTATIN/NYSTOP) powder Apply 1 application topically 2 (two) times daily. Apply to affected area twice a day for 14 days 60 g 0   omeprazole (PRILOSEC) 40 MG capsule Take 1 capsule (40 mg total) by mouth daily. 90 capsule 3   polyethylene glycol powder (GLYCOLAX/MIRALAX) 17 GM/SCOOP powder Take 17 g by mouth as needed. 255 g 0   promethazine (PHENERGAN) 25 MG tablet Take 0.5 tablets (12.5 mg total) by mouth every 8 (eight) hours as needed for nausea. 90 tablet 1   Semaglutide,  1 MG/DOSE, 4 MG/3ML SOPN Inject 1 mg as directed once a week. 3 mL 0   Simethicone (MYLANTA GAS PO) Take by mouth 3 times/day as needed-between meals & bedtime.     venlafaxine XR (EFFEXOR-XR) 150 MG 24 hr capsule TAKE 1 CAPSULE EVERY DAY WITH BREAKFAST 90 capsule 1   atorvastatin (LIPITOR) 20 MG tablet Take 1 tablet (20 mg total) by mouth daily. 90 tablet 3   carvedilol (COREG) 6.25 MG tablet TAKE 1 TABLET TWICE DAILY WITH A MEAL 180 tablet 3   furosemide (LASIX) 20 MG tablet Take 1 tablet (20 mg total) by mouth every other day. And as needed for swelling or shortness of breath 55 tablet 3   lisinopril (ZESTRIL) 5 MG tablet TAKE 1/2 TABLET EVERY DAY. 45 tablet 3   potassium chloride (KLOR-CON) 10 MEQ tablet Take 1 tablet (10 mEq total) by mouth every other day. Take with Lasix 55 tablet 3   No current facility-administered medications for this visit.     Allergies:   Fosamax [alendronate  sodium], Ginzing [ginseng], Hydrocod polst-cpm polst er, Hydrocodone-acetaminophen, Imitrex [sumatriptan], Oxycodone, Peanut-containing drug products, Tramadol, Hydrocodone-guaifenesin, Prednisone, and Tape   Social History:  The patient  reports that she quit smoking about 42 years ago. Her smoking use included cigarettes. She has a 1.13 pack-year smoking history. She has never used smokeless tobacco. She reports current alcohol use. She reports that she does not use drugs.   Family History:   family history includes Aneurysm in her paternal grandmother; Anxiety disorder in her mother; Arthritis in her paternal grandfather, paternal grandmother, sister, and sister; Asthma in her brother, mother, sister, and sister; Colon cancer in her maternal grandmother; Depression in her mother; Diabetes in her paternal grandfather, paternal grandmother, and sister; Heart disease in her mother and sister; Hypertension in her mother; Irritable bowel syndrome in her mother; Lung cancer in her maternal grandfather; Lymphoma in her maternal grandmother; Melanoma in her maternal grandfather; Migraines in her brother, maternal grandmother, sister, and sister; Nephrolithiasis in her sister; Osteoporosis in her mother and sister; Other in her brother; Other (age of onset: 60) in her father; Stroke in her maternal grandmother and mother; Thyroid disease in her mother.    Review of Systems: Review of Systems  Constitutional: Negative.   HENT: Negative.    Respiratory:  Positive for shortness of breath.   Cardiovascular: Negative.   Gastrointestinal: Negative.   Musculoskeletal: Negative.   Neurological: Negative.   Psychiatric/Behavioral: Negative.    All other systems reviewed and are negative.  PHYSICAL EXAM: VS:  BP 120/74 (BP Location: Left Arm, Patient Position: Sitting, Cuff Size: Normal)   Pulse 72   Ht 5\' 5"  (1.651 m)   Wt 166 lb 8 oz (75.5 kg)   SpO2 98%   BMI 27.71 kg/m  , BMI Body mass index is 27.71  kg/m. Constitutional:  oriented to person, place, and time. No distress.  HENT:  Head: Grossly normal Eyes:  no discharge. No scleral icterus.  Neck: No JVD, no carotid bruits  Cardiovascular: Regular rate and rhythm, no murmurs appreciated Pulmonary/Chest: Clear to auscultation bilaterally, no wheezes or rails Abdominal: Soft.  no distension.  no tenderness.  Musculoskeletal: Normal range of motion Neurological:  normal muscle tone. Coordination normal. No atrophy Skin: Skin warm and dry Psychiatric: normal affect, pleasant  Recent Labs: 07/10/2021: ALT 15; BUN 14; Hemoglobin 13.9; Platelets 274.0; Potassium 4.8; Sodium 135; TSH 1.54 08/13/2021: Creatinine, Ser 0.80    Lipid Panel Lab Results  Component Value Date   CHOL 166 07/10/2021   HDL 55.20 07/10/2021   LDLCALC 89 07/10/2021   TRIG 105.0 07/10/2021     Wt Readings from Last 3 Encounters:  11/02/21 166 lb 8 oz (75.5 kg)  10/29/21 164 lb (74.4 kg)  10/08/21 164 lb (74.4 kg)     ASSESSMENT AND PLAN:  Pure hypercholesterolemia Cholesterol is at goal on the current lipid regimen. No changes to the medications were made.  Essential hypertension -  Blood pressure is well controlled on today's visit. No changes made to the medications.  CORONARY ARTERY BYPASS GRAFT Chronic stable angina Sx stable, no further testing needed  Dissection of thoracoabdominal aorta (HCC) [place] vascular surgeryin [place], Dr. Aurelio Jew on recent eval , CT 07/2019, reviewed Has done well 11 years later  H/O dissecting abdominal aortic aneurysm repair -  MRI 2020  Followed by vascular in [place]  Chronic diastolic CHF euvolemic echo EF 45% BP low, cut back lasix to QOD  Abdominal pain Symptoms improved  Morbid obesity Weight down through diet and exercise   Total encounter time more than 25 minutes  Greater than 50% was spent in counseling and coordination of care with the patient    Orders Placed This Encounter   Procedures   EKG 12-Lead     Signed, Esmond Plants, M.D., Ph.D. 11/02/2021  [place], [place]

## 2021-11-02 ENCOUNTER — Ambulatory Visit (INDEPENDENT_AMBULATORY_CARE_PROVIDER_SITE_OTHER): Payer: Medicare HMO | Admitting: Cardiovascular Disease

## 2021-11-02 ENCOUNTER — Other Ambulatory Visit: Payer: Self-pay

## 2021-11-02 ENCOUNTER — Encounter: Payer: Self-pay | Admitting: Cardiovascular Disease

## 2021-11-02 VITALS — BP 120/74 | HR 72 | Ht 65.0 in | Wt 166.5 lb

## 2021-11-02 DIAGNOSIS — I7103 Dissection of thoracoabdominal aorta: Secondary | ICD-10-CM | POA: Diagnosis not present

## 2021-11-02 DIAGNOSIS — Z952 Presence of prosthetic heart valve: Secondary | ICD-10-CM | POA: Diagnosis not present

## 2021-11-02 DIAGNOSIS — R0600 Dyspnea, unspecified: Secondary | ICD-10-CM

## 2021-11-02 DIAGNOSIS — I10 Essential (primary) hypertension: Secondary | ICD-10-CM

## 2021-11-02 DIAGNOSIS — I480 Paroxysmal atrial fibrillation: Secondary | ICD-10-CM

## 2021-11-02 DIAGNOSIS — I2510 Atherosclerotic heart disease of native coronary artery without angina pectoris: Secondary | ICD-10-CM

## 2021-11-02 DIAGNOSIS — I5042 Chronic combined systolic (congestive) and diastolic (congestive) heart failure: Secondary | ICD-10-CM | POA: Diagnosis not present

## 2021-11-02 DIAGNOSIS — Z8679 Personal history of other diseases of the circulatory system: Secondary | ICD-10-CM | POA: Diagnosis not present

## 2021-11-02 MED ORDER — LISINOPRIL 5 MG PO TABS: ORAL_TABLET | ORAL | 3 refills | Status: DC

## 2021-11-02 MED ORDER — FUROSEMIDE 20 MG PO TABS: 20.0000 mg | ORAL_TABLET | ORAL | 3 refills | Status: DC

## 2021-11-02 MED ORDER — ATORVASTATIN CALCIUM 20 MG PO TABS: 20.0000 mg | ORAL_TABLET | Freq: Every day | ORAL | 3 refills | Status: DC

## 2021-11-02 MED ORDER — POTASSIUM CHLORIDE CRYS ER 10 MEQ PO TBCR: 10.0000 meq | EXTENDED_RELEASE_TABLET | ORAL | 3 refills | Status: DC

## 2021-11-02 MED ORDER — CARVEDILOL 6.25 MG PO TABS: ORAL_TABLET | ORAL | 3 refills | Status: DC

## 2021-11-02 NOTE — Patient Instructions (Addendum)
Medication Instructions:  Lasix 20 mg Take every other day  and as needed for swelling/shortness of breath  If you need a refill on your cardiac medications before your next appointment, please call your pharmacy.   Lab work: No new labs needed  Testing/Procedures: No new testing needed  Follow-Up: At [facility], you and your health needs are our priority.  As part of our continuing mission to provide you with exceptional heart care, we have created designated Provider Care Teams.  These Care Teams include your primary Cardiologist (physician) and Advanced Practice Providers (APPs -  Physician Assistants and Nurse Practitioners) who all work together to provide you with the care you need, when you need it.  You will need a follow up appointment in 12 months  Providers on your designated Care Team:   Murray Hodgkins, NP Christell Faith, PA-C Cadence Kathlen Mody, [place]   COVID-19 Vaccine Information can be found at: [email or web address] For questions related to vaccine distribution or appointments, please email [email or web address] or call [phone].

## 2021-11-04 ENCOUNTER — Ambulatory Visit: Payer: Medicare HMO

## 2021-11-05 ENCOUNTER — Other Ambulatory Visit: Payer: Self-pay

## 2021-11-05 ENCOUNTER — Encounter (INDEPENDENT_AMBULATORY_CARE_PROVIDER_SITE_OTHER): Payer: Self-pay | Admitting: Family Medicine

## 2021-11-05 ENCOUNTER — Ambulatory Visit (INDEPENDENT_AMBULATORY_CARE_PROVIDER_SITE_OTHER): Payer: Medicare HMO | Admitting: Family Medicine

## 2021-11-05 VITALS — BP 114/65 | HR 83 | Temp 98.0°F | Ht 65.0 in | Wt 162.0 lb

## 2021-11-05 DIAGNOSIS — R7303 Prediabetes: Secondary | ICD-10-CM | POA: Diagnosis not present

## 2021-11-05 DIAGNOSIS — E7849 Other hyperlipidemia: Secondary | ICD-10-CM | POA: Diagnosis not present

## 2021-11-05 MED ORDER — SEMAGLUTIDE (1 MG/DOSE) 4 MG/3ML ~~LOC~~ SOPN: 1.0000 mg | PEN_INJECTOR | SUBCUTANEOUS | 0 refills | Status: DC

## 2021-11-05 NOTE — Progress Notes (Signed)
Chief Complaint:   OBESITY Rachel Torres is here to discuss her progress with her obesity treatment plan along with follow-up of her obesity related diagnoses. Rachel Torres is on following a lower carbohydrate, vegetable and lean protein rich diet plan and states she is following her eating plan approximately 40% of the time. Rachel Torres states she is walking around for 30 minutes 3 times per week.  Today's visit was #: 20 Starting weight: 213 lbs Starting date: 05/05/2020 Today's weight: 162 lbs Today's date: 11/05/2021 Total lbs lost to date: 51 Total lbs lost since last in-office visit: 2  Interim History: Rachel Torres continues to do well with weight loss despite some celebration eating over her birthday. She was able to portion control.  Subjective:   1. Pre-diabetes Rachel Torres is stable on Ozempic, and she is doing well with diet and weight loss. No side effects were noted unless she overindulges.  2. Other hyperlipidemia Rachel Torres is doing well on Lipitor even at a low dose. She denies chest pain or myalgias. She is doing very well with diet and weight loss.  Assessment/Plan:   1. Pre-diabetes Rachel Torres will continue to work on weight loss, exercise, and decreasing simple carbohydrates to help decrease the risk of diabetes. We will refill Ozempic for 1 month.  - Semaglutide, 1 MG/DOSE, 4 MG/3ML SOPN; Inject 1 mg as directed once a week.  Dispense: 3 mL; Refill: 0  2. Other hyperlipidemia Cardiovascular risk and specific lipid/LDL goals reviewed. We discussed several lifestyle modifications today. Rachel Torres will continue Lipitor, diet, exercise, and will continue to follow up as directed. Orders and follow up as documented in patient record.   3. Obesity with current BMI 27.0 Rachel Torres is currently in the action stage of change. As such, her goal is to continue with weight loss efforts. She has agreed to following a lower carbohydrate, vegetable and lean protein rich diet plan.   Exercise goals: As  is.  Behavioral modification strategies: increasing water intake and holiday eating strategies .  Rachel Torres has agreed to follow-up with our clinic in 4 weeks. She was informed of the importance of frequent follow-up visits to maximize her success with intensive lifestyle modifications for her multiple health conditions.   Objective:   Blood pressure 114/65, pulse 83, temperature 98 F (36.7 C), height 5\' 5"  (1.651 m), weight 162 lb (73.5 kg). Body mass index is 26.96 kg/m.  General: Cooperative, alert, well developed, in no acute distress. HEENT: Conjunctivae and lids unremarkable. Cardiovascular: Regular rhythm.  Lungs: Normal work of breathing. Neurologic: No focal deficits.   Lab Results  Component Value Date   CREATININE 0.80 08/13/2021   BUN 14 07/10/2021   NA 135 07/10/2021   K 4.8 07/10/2021   CL 99 07/10/2021   CO2 30 07/10/2021   Lab Results  Component Value Date   ALT 15 07/10/2021   AST 16 07/10/2021   ALKPHOS 62 07/10/2021   BILITOT 0.6 07/10/2021   Lab Results  Component Value Date   HGBA1C 5.6 07/10/2021   HGBA1C 5.5 02/26/2021   HGBA1C 5.7 (H) 10/29/2020   HGBA1C 5.6 07/23/2020   HGBA1C 5.9 (H) 05/05/2020   Lab Results  Component Value Date   INSULIN 17.3 02/26/2021   INSULIN 13.9 10/29/2020   INSULIN 22.5 07/23/2020   INSULIN 25.2 (H) 05/05/2020   Lab Results  Component Value Date   TSH 1.54 07/10/2021   Lab Results  Component Value Date   CHOL 166 07/10/2021   HDL 55.20 07/10/2021  LDLCALC 89 07/10/2021   LDLDIRECT 189.4 10/13/2011   TRIG 105.0 07/10/2021   CHOLHDL 3 07/10/2021   Lab Results  Component Value Date   VD25OH 74.87 07/10/2021   VD25OH 62.2 02/26/2021   VD25OH 59.5 10/29/2020   Lab Results  Component Value Date   WBC 5.6 07/10/2021   HGB 13.9 07/10/2021   HCT 41.3 07/10/2021   MCV 96.2 07/10/2021   PLT 274.0 07/10/2021   Lab Results  Component Value Date   FERRITIN 105.0 12/11/2010    Obesity Behavioral  Intervention:   Approximately 15 minutes were spent on the discussion below.  ASK: We discussed the diagnosis of obesity with Rachel Torres today and Rachel Torres agreed to give [place] permission to discuss obesity behavioral modification therapy today.  ASSESS: Rachel Torres has the diagnosis of obesity and her BMI today is 27.0. Rachel Torres is in the action stage of change.   ADVISE: Rachel Torres was educated on the multiple health risks of obesity as well as the benefit of weight loss to improve her health. She was advised of the need for long term treatment and the importance of lifestyle modifications to improve her current health and to decrease her risk of future health problems.  AGREE: Multiple dietary modification options and treatment options were discussed and Rachel Torres agreed to follow the recommendations documented in the above note.  ARRANGE: Rachel Torres was educated on the importance of frequent visits to treat obesity as outlined per CMS and USPSTF guidelines and agreed to schedule her next follow up appointment today.  Attestation Statements:   Reviewed by clinician on day of visit: allergies, medications, problem list, medical history, surgical history, family history, social history, and previous encounter notes.   I, Trixie Dredge, am acting as transcriptionist for [name], MD.  I have reviewed the above documentation for accuracy and completeness, and I agree with the above. -  [name], MD

## 2021-11-11 ENCOUNTER — Ambulatory Visit: Payer: Medicare HMO

## 2021-11-16 DIAGNOSIS — I498 Other specified cardiac arrhythmias: Secondary | ICD-10-CM | POA: Diagnosis not present

## 2021-11-16 DIAGNOSIS — I480 Paroxysmal atrial fibrillation: Secondary | ICD-10-CM | POA: Diagnosis not present

## 2021-11-18 ENCOUNTER — Ambulatory Visit: Payer: Medicare HMO

## 2021-11-18 ENCOUNTER — Other Ambulatory Visit (INDEPENDENT_AMBULATORY_CARE_PROVIDER_SITE_OTHER): Payer: Self-pay | Admitting: Family Medicine

## 2021-11-18 DIAGNOSIS — R7303 Prediabetes: Secondary | ICD-10-CM

## 2021-11-23 ENCOUNTER — Other Ambulatory Visit: Payer: Self-pay | Admitting: Family Medicine

## 2021-11-23 NOTE — Telephone Encounter (Signed)
Please call the pharmacy and see if they have the Rx

## 2021-11-23 NOTE — Telephone Encounter (Signed)
LAST APPOINTMENT DATE: 11/05/21 NEXT APPOINTMENT DATE: 12/03/21   [place], [place] [address] Phone: [phone] Fax: [phone]  [facility] [place], [place] [address] [place] [address] Phone: [phone] Fax: [phone]  [id number] [place], [place] - [address] [facility] [address] [place] [address] Phone: [phone] Fax: [place], [place] - [address] [place] [address] Phone: [phone] Fax: [id number] [place], [place] [address] Phone: [phone] Fax: [phone]  [id number] [place], [place] [facility] [address] [facility] [address] [place] [address] Phone: [phone] Fax: [phone]  Patient is requesting a refill of the following medications: Requested Prescriptions   Pending Prescriptions Disp Refills   Semaglutide, 1 MG/DOSE, 4 MG/3ML SOPN 3 mL 0    Sig: Inject 1 mg as directed once a week.    Date last filled: 11/05/21 Previously prescribed by Dr. Leafy Ro  Lab Results  Component Value Date   HGBA1C 5.6 07/10/2021   HGBA1C 5.5 02/26/2021   HGBA1C 5.7 (H) 10/29/2020   Lab Results  Component Value Date   LDLCALC 89 07/10/2021   CREATININE 0.80 08/13/2021   Lab Results  Component Value Date   VD25OH 74.87 07/10/2021   VD25OH 62.2 02/26/2021   VD25OH 59.5 10/29/2020    BP Readings from Last 3 Encounters:  11/05/21 114/65  11/02/21 120/74  10/08/21 122/72

## 2021-11-23 NOTE — Telephone Encounter (Signed)
Dr.Beasley 

## 2021-11-24 MED ORDER — SEMAGLUTIDE (1 MG/DOSE) 4 MG/3ML ~~LOC~~ SOPN: 1.0000 mg | PEN_INJECTOR | SUBCUTANEOUS | 0 refills | Status: DC

## 2021-11-24 NOTE — Telephone Encounter (Signed)
LAST APPOINTMENT DATE: 11/05/21 NEXT APPOINTMENT DATE: 12/03/21   Patient is requesting a refill of the following medications: Requested Prescriptions   Pending Prescriptions Disp Refills   Semaglutide, 1 MG/DOSE, 4 MG/3ML SOPN 3 mL 0    Sig: Inject 1 mg as directed once a week.    Date last filled: 10/15/21 Previously prescribed by Dr. Leafy Ro  Lab Results  Component Value Date   HGBA1C 5.6 07/10/2021   HGBA1C 5.5 02/26/2021   HGBA1C 5.7 (H) 10/29/2020   Lab Results  Component Value Date   LDLCALC 89 07/10/2021   CREATININE 0.80 08/13/2021   Lab Results  Component Value Date   VD25OH 74.87 07/10/2021   VD25OH 62.2 02/26/2021   VD25OH 59.5 10/29/2020    BP Readings from Last 3 Encounters:  11/05/21 114/65  11/02/21 120/74  10/08/21 122/72

## 2021-12-02 ENCOUNTER — Other Ambulatory Visit: Payer: Self-pay | Admitting: Family Medicine

## 2021-12-03 ENCOUNTER — Ambulatory Visit (INDEPENDENT_AMBULATORY_CARE_PROVIDER_SITE_OTHER): Payer: Medicare HMO | Admitting: Family Medicine

## 2021-12-03 ENCOUNTER — Encounter (INDEPENDENT_AMBULATORY_CARE_PROVIDER_SITE_OTHER): Payer: Self-pay | Admitting: Family Medicine

## 2021-12-03 ENCOUNTER — Other Ambulatory Visit: Payer: Self-pay

## 2021-12-03 VITALS — BP 128/79 | HR 92 | Temp 98.0°F | Ht 65.0 in | Wt 161.0 lb

## 2021-12-03 DIAGNOSIS — E538 Deficiency of other specified B group vitamins: Secondary | ICD-10-CM

## 2021-12-03 DIAGNOSIS — R7303 Prediabetes: Secondary | ICD-10-CM | POA: Diagnosis not present

## 2021-12-03 DIAGNOSIS — Z6835 Body mass index (BMI) 35.0-35.9, adult: Secondary | ICD-10-CM | POA: Diagnosis not present

## 2021-12-03 DIAGNOSIS — E7849 Other hyperlipidemia: Secondary | ICD-10-CM

## 2021-12-03 DIAGNOSIS — E559 Vitamin D deficiency, unspecified: Secondary | ICD-10-CM | POA: Diagnosis not present

## 2021-12-03 MED ORDER — SEMAGLUTIDE (1 MG/DOSE) 4 MG/3ML ~~LOC~~ SOPN: 1.0000 mg | PEN_INJECTOR | SUBCUTANEOUS | 0 refills | Status: DC

## 2021-12-03 NOTE — Progress Notes (Signed)
Chief Complaint:   OBESITY Rachel Torres is here to discuss her progress with her obesity treatment plan along with follow-up of her obesity related diagnoses. Rachel Torres is on following a lower carbohydrate, vegetable and lean protein rich diet plan and states she is following her eating plan approximately 50% of the time. Rachel Torres states she is doing 0 minutes 0 times per week.  Today's visit was #: 21 Starting weight: 213 lbs Starting date: 05/05/2020 Today's weight: 161 lbs Today's date: 12/03/2021 Total lbs lost to date: 52 Total lbs lost since last in-office visit: 1  Interim History: Rachel Torres continues to do well with weight loss on her Low carbohydrate plan, even over Thanksgiving. Her hunger is controlled and she is doing well with decreasing simple carbohydrates. She is very happy that she is wearing smaller sizes comfortably.  Subjective:   1. Pre-diabetes Rachel Torres is doing well on Ozempic, and she is working on diet and weight loss.  2. Other hyperlipidemia Rachel Torres is on Omega 3 and statin, and she is due for labs. No side effects were noted.  3. Vitamin D deficiency Rachel Torres is on Vit D, and she is due to have labs checked.  4. B12 deficiency Rachel Torres has a strong family history of B12 deficiency. She would like her B12 level checked.  Assessment/Plan:   1. Pre-diabetes Rachel Torres will continue to work on weight loss, exercise, and decreasing simple carbohydrates to help decrease the risk of diabetes. We will check labs today, and we will refill Ozempic for 1 month.  - Semaglutide, 1 MG/DOSE, 4 MG/3ML SOPN; Inject 1 mg as directed once a week.  Dispense: 3 mL; Refill: 0 - CMP14+EGFR - Insulin, random - Hemoglobin A1c  2. Other hyperlipidemia Cardiovascular risk and specific lipid/LDL goals reviewed. We discussed several lifestyle modifications today. We will check labs today. Rachel Torres will continue to work on diet, exercise and weight loss efforts. Orders and follow up as  documented in patient record.   - Lipid Panel With LDL/HDL Ratio  3. Vitamin D deficiency Low Vitamin D level contributes to fatigue and are associated with obesity, breast, and colon cancer. We will check labs today. Rachel Torres will follow-up for routine testing of Vitamin D, at least 2-3 times per year to avoid over-replacement.  - VITAMIN D 25 Hydroxy (Vit-D Deficiency, Fractures)  4. B12 deficiency The diagnosis was reviewed with the patient. We will check labs today, and will follow up at Rachel Torres next visit. Orders and follow up as documented in patient record.  - Vitamin B12  5. Obesity BMI today is 38 Rachel Torres is currently in the action stage of change. As such, her goal is to continue with weight loss efforts. She has agreed to following a lower carbohydrate, vegetable and lean protein rich diet plan.   Behavioral modification strategies: increasing lean protein intake.  Rachel Torres has agreed to follow-up with our clinic in 4 weeks. She was informed of the importance of frequent follow-up visits to maximize her success with intensive lifestyle modifications for her multiple health conditions.   Rachel Torres was informed we would discuss her lab results at her next visit unless there is a critical issue that needs to be addressed sooner. Rachel Torres agreed to keep her next visit at the agreed upon time to discuss these results.  Objective:   Blood pressure 128/79, pulse 92, temperature 98 F (36.7 C), height _0  (1.651 m), weight 161 lb (73 kg). Body mass index is 26.79 kg/m.  General: Cooperative, alert, well developed,  in no acute distress. HEENT: Conjunctivae and lids unremarkable. Cardiovascular: Regular rhythm.  Lungs: Normal work of breathing. Neurologic: No focal deficits.   Lab Results  Component Value Date   CREATININE 0.80 08/13/2021   BUN 14 07/10/2021   NA 135 07/10/2021   K 4.8 07/10/2021   CL 99 07/10/2021   CO2 30 07/10/2021   Lab Results  Component Value Date    ALT 15 07/10/2021   AST 16 07/10/2021   ALKPHOS 62 07/10/2021   BILITOT 0.6 07/10/2021   Lab Results  Component Value Date   HGBA1C 5.6 07/10/2021   HGBA1C 5.5 02/26/2021   HGBA1C 5.7 (H) 10/29/2020   HGBA1C 5.6 07/23/2020   HGBA1C 5.9 (H) 05/05/2020   Lab Results  Component Value Date   INSULIN 17.3 02/26/2021   INSULIN 13.9 10/29/2020   INSULIN 22.5 07/23/2020   INSULIN 25.2 (H) 05/05/2020   Lab Results  Component Value Date   TSH 1.54 07/10/2021   Lab Results  Component Value Date   CHOL 166 07/10/2021   HDL 55.20 07/10/2021   LDLCALC 89 07/10/2021   LDLDIRECT 189.4 10/13/2011   TRIG 105.0 07/10/2021   CHOLHDL 3 07/10/2021   Lab Results  Component Value Date   VD25OH 74.87 07/10/2021   VD25OH 62.2 02/26/2021   VD25OH 59.5 10/29/2020   Lab Results  Component Value Date   WBC 5.6 07/10/2021   HGB 13.9 07/10/2021   HCT 41.3 07/10/2021   MCV 96.2 07/10/2021   PLT 274.0 07/10/2021   Lab Results  Component Value Date   FERRITIN 105.0 12/11/2010   Attestation Statements:   Reviewed by clinician on day of visit: allergies, medications, problem list, medical history, surgical history, family history, social history, and previous encounter notes.   I, Trixie Dredge, am acting as transcriptionist for [name], MD.  I have reviewed the above documentation for accuracy and completeness, and I agree with the above. -  [name], MD

## 2021-12-04 LAB — LIPID PANEL WITH LDL/HDL RATIO
Cholesterol, Total: 195 mg/dL (ref 100–199)
HDL: 67 mg/dL (ref >39)
LDL Chol Calc (NIH): 112 mg/dL — ABNORMAL HIGH (ref 0–99)
LDL/HDL Ratio: 1.7 ratio (ref 0.0–3.2)
Triglycerides: 91 mg/dL (ref 0–149)
VLDL Cholesterol Cal: 16 mg/dL (ref 5–40)

## 2021-12-04 LAB — CMP14+EGFR
ALT: 16 IU/L (ref 0–32)
AST: 18 IU/L (ref 0–40)
Albumin/Globulin Ratio: 1.9 (ref 1.2–2.2)
Albumin: 4.6 g/dL (ref 3.8–4.8)
Alkaline Phosphatase: 68 IU/L (ref 44–121)
BUN/Creatinine Ratio: 19 (ref 12–28)
BUN: 15 mg/dL (ref 8–27)
Bilirubin Total: 0.5 mg/dL (ref 0.0–1.2)
CO2: 22 mmol/L (ref 20–29)
Calcium: 9.3 mg/dL (ref 8.7–10.3)
Chloride: 100 mmol/L (ref 96–106)
Creatinine, Ser: 0.77 mg/dL (ref 0.57–1.00)
Globulin, Total: 2.4 g/dL (ref 1.5–4.5)
Glucose: 90 mg/dL (ref 70–99)
Potassium: 4.3 mmol/L (ref 3.5–5.2)
Sodium: 137 mmol/L (ref 134–144)
Total Protein: 7 g/dL (ref 6.0–8.5)
eGFR: 86 mL/min/{1.73_m2} (ref >59)

## 2021-12-04 LAB — HEMOGLOBIN A1C
Est. average glucose Bld gHb Est-mCnc: 111 mg/dL
Hgb A1c MFr Bld: 5.5 % (ref 4.8–5.6)

## 2021-12-04 LAB — VITAMIN D 25 HYDROXY (VIT D DEFICIENCY, FRACTURES): Vit D, 25-Hydroxy: 57.7 ng/mL (ref 30.0–100.0)

## 2021-12-04 LAB — INSULIN, RANDOM: INSULIN: 4.5 u[IU]/mL (ref 2.6–24.9)

## 2021-12-04 LAB — VITAMIN B12: Vitamin B-12: 858 pg/mL (ref 232–1245)
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
## 2021-12-07 ENCOUNTER — Telehealth: Payer: Self-pay

## 2021-12-07 ENCOUNTER — Telehealth (INDEPENDENT_AMBULATORY_CARE_PROVIDER_SITE_OTHER): Payer: Medicare HMO | Admitting: Psychology

## 2021-12-07 DIAGNOSIS — F4320 Adjustment disorder, unspecified: Secondary | ICD-10-CM

## 2021-12-07 DIAGNOSIS — F5089 Other specified eating disorder: Secondary | ICD-10-CM

## 2021-12-07 NOTE — Progress Notes (Addendum)
Chronic Care Management Pharmacy Assistant   Name: Rachel Torres  MRN: [id number] DOB: 08/22/1956  Reason for Encounter: CCM (Hypertension Disease State)   Recent office visits:  10/29/2021 - Orrin Brigham, LPN - Patient presented for Annual Wellness Visit. No medication changes.   Recent consult visits:  12/07/2021 - Glennie Isle, PsyD - Psychology - Video Visit - Patient presented for other specified feed or eating disorder and emotional eating behaviors.  11/03/2021 - [name], MD - Family Medicine - Patient presented for discussion of her progress with her obesity treatment plan along with follow-up of her obesity related diagnoses. Labs: Lipid panel, CMP14+EGFR, A1c, Insulin random, Vit B12 and Vit D. No medication changes.  11/05/2021 - [name], MD - Family Medicine - Patient presented for discussion of her progress with her obesity treatment plan along with follow-up of her obesity related diagnoses. No medication changes.  11/02/2021 - [name], MD - Cardiology - Patient presented for 12 month follow up for Paroxysmal Atrial Fibrillation. Labs: EKG. Change: furosemide (LASIX) 20 MG tablet - every other day and as needed for swelling or shortness of breath vs. 1 tablet daily. All other medication remains the same.  10/21/2021 - Janna Arch, PT - Physical Therapy - Patient presented for physical therapy due to right shoulder pain. Last physical therapy appointment.   Hospital visits:  None in previous 6 months  Medications: Outpatient Encounter Medications as of 12/07/2021  Medication Sig   acetaminophen (TYLENOL) 500 MG tablet Take 500 mg by mouth every 6 (six) hours as needed.   albuterol (VENTOLIN HFA) 108 (90 Base) MCG/ACT inhaler Inhale 1-2 puffs into the lungs every 6 (six) hours as needed for wheezing or shortness of breath.   ALPRAZolam (XANAX) 0.5 MG tablet Take 1 tablet (0.5 mg total) by mouth daily as needed for anxiety.   aspirin EC 81 MG tablet  Take 81 mg by mouth at bedtime.   atorvastatin (LIPITOR) 20 MG tablet Take 1 tablet (20 mg total) by mouth daily.   carvedilol (COREG) 6.25 MG tablet TAKE 1 TABLET TWICE DAILY WITH A MEAL   Cholecalciferol (VITAMIN D3) 50 MCG (2000 UT) CAPS Take 2,000 capsules by mouth.   clindamycin (CLEOCIN) 300 MG capsule    cyclobenzaprine (FLEXERIL) 10 MG tablet Take 0.5-1 tablets (5-10 mg total) by mouth 3 (three) times daily as needed. For headache   furosemide (LASIX) 20 MG tablet Take 1 tablet (20 mg total) by mouth every other day. And as needed for swelling or shortness of breath   Insulin Pen Needle (BD PEN NEEDLE NANO U/F) 32G X 4 MM MISC Use Nano needle with Ozempic   Krill Oil (OMEGA-3) 500 MG CAPS Take 1 capsule by mouth daily.   lisinopril (ZESTRIL) 5 MG tablet TAKE 1/2 TABLET EVERY DAY.   magnesium gluconate (MAGONATE) 500 MG tablet Take 500 mg by mouth at bedtime.   Melatonin 5 MG SUBL Place under the tongue.   Misc Natural Products (TART CHERRY ADVANCED) CAPS Take 1 capsule by mouth daily.   naproxen sodium (ANAPROX) 220 MG tablet Take 440 mg by mouth 2 (two) times daily as needed (for headaches/pain).   nystatin (MYCOSTATIN/NYSTOP) powder Apply 1 application topically 2 (two) times daily. Apply to affected area twice a day for 14 days   omeprazole (PRILOSEC) 40 MG capsule TAKE 1 CAPSULE EVERY DAY   polyethylene glycol powder (GLYCOLAX/MIRALAX) 17 GM/SCOOP powder Take 17 g by mouth as needed.   potassium chloride (KLOR-CON) 10  MEQ tablet Take 1 tablet (10 mEq total) by mouth every other day. Take with Lasix   promethazine (PHENERGAN) 25 MG tablet Take 0.5 tablets (12.5 mg total) by mouth every 8 (eight) hours as needed for nausea.   Semaglutide, 1 MG/DOSE, 4 MG/3ML SOPN Inject 1 mg as directed once a week.   Simethicone (MYLANTA GAS PO) Take by mouth 3 times/day as needed-between meals & bedtime.   venlafaxine XR (EFFEXOR-XR) 150 MG 24 hr capsule TAKE 1 CAPSULE EVERY DAY WITH BREAKFAST   No  facility-administered encounter medications on file as of 12/07/2021.     Recent Office Vitals: BP Readings from Last 3 Encounters:  12/03/21 128/79  11/05/21 114/65  11/02/21 120/74   Pulse Readings from Last 3 Encounters:  12/03/21 92  11/05/21 83  11/02/21 72    Wt Readings from Last 3 Encounters:  12/03/21 161 lb (73 kg)  11/05/21 162 lb (73.5 kg)  11/02/21 166 lb 8 oz (75.5 kg)     Kidney Function Lab Results  Component Value Date/Time   CREATININE 0.77 12/03/2021 07:55 AM   CREATININE 0.80 08/13/2021 11:48 AM   CREATININE 0.94 06/20/2013 08:15 AM   CREATININE 0.96 05/12/2012 12:12 PM   CREATININE 0.88 04/09/2011 09:00 AM   GFR 68.85 07/10/2021 08:10 AM   GFRNONAA 68 07/23/2020 03:33 PM   GFRNONAA >60 06/20/2013 08:15 AM   GFRAA 78 07/23/2020 03:33 PM   GFRAA >60 06/20/2013 08:15 AM    BMP Latest Ref Rng & Units 12/03/2021 08/13/2021 07/10/2021  Glucose 70 - 99 mg/dL 90 - 95  BUN 8 - 27 mg/dL 15 - 14  Creatinine 0.57 - 1.00 mg/dL 0.77 0.80 0.89  BUN/Creat Ratio 12 - 28 19 - -  Sodium 134 - 144 mmol/L 137 - 135  Potassium 3.5 - 5.2 mmol/L 4.3 - 4.8  Chloride 96 - 106 mmol/L 100 - 99  CO2 20 - 29 mmol/L 22 - 30  Calcium 8.7 - 10.3 mg/dL 9.3 - 9.5   Contacted patient on 12/08/2021 to discuss hypertension disease state  Current antihypertensive regimen:  Carvedilol 6.25 mg - 1 tablet twice daily with meal Lisinopril 5 mg - 1/2 tablet daily  Patient verbally confirms she is taking the above medications as directed. Yes  How often are you checking your Blood Pressure? Patient takes her blood pressure infrequently. I asked patient to take her blood pressure while on the phone with me. Patient could not take her blood pressure as she does not have batteries for her cuff. Last in office blood pressure reading was 128/79 on 12/03/2021.  Wrist or arm cuff: Arm Caffeine intake: Patient drinks tea occasionally. Drinks decaf coffee Salt intake: Watches her salt  intake OTC medications including pseudoephedrine or NSAIDs? Patient will take Tylenol 500 mg (2 tablets) aprox. 1 time a week. Patient will also take Naproxen maybe two times a month.   Any readings above 180/120? No  What recent interventions/DTPs have been made by any provider to improve Blood Pressure control since last CPP Visit: No recent interventions. Continue current medications.   Any recent hospitalizations or ED visits since last visit with CPP? No  What diet changes have been made to improve Blood Pressure Control?  Patient watches her carb intake and smaller meals.   What exercise is being done to improve your Blood Pressure Control?  Patient goes to the healthy weight and wellness program. Patient goes 1 time a month. Patient has lost 60 pounds! State she is no  longer pre-diabetic.   Adherence Review: Is the patient currently on ACE/ARB medication? Yes Does the patient have >5 day gap between last estimated fill dates? No  Star Rating Drugs:  Medication:  Last Fill: Day Supply Ozempic                      10/15/2021 28 Lisinopril 93m              10/31/2021 90 Atorvastatin 269m      10/31/2021 90   Care Gaps: Annual wellness visit in last year? Yes 10/29/2021 Most Recent BP reading: 128/79 on 12/03/2021  Patient states she would like her Effexor-XR 150 mg 24 hr capsule increased. Patient stated she is under a lot of stress. Patient has also been taking her Xanax 0.5 mg to control her stress with the Effexor. Patient would like a call from Dr. ToMarliss Cootsffice to see if the prescription can be increased.   No appointments scheduled within the next 30 days.  MiDebbora DusCPP notified  AmMarijean NiemannRMUtahlinical Pharmacy Assistant [phone] have reviewed the care management and care coordination activities outlined in this encounter and I am certifying that I agree with the content of this note. Note sent to PCP.  MiDebbora DusPharmD Clinical  Pharmacist [phone]

## 2021-12-08 ENCOUNTER — Telehealth: Payer: Self-pay

## 2021-12-08 NOTE — Telephone Encounter (Signed)
We can add another 37.5 mg daily to the 150 she is taking That is about as high as I feel comfortable with    I pended it  Please send to pharmacy of choice   If she has side effects or feels worse let me know  If severe depression or suicidal -go to ER  Is she in any counseling or interested?  Please f/u here in about a month

## 2021-12-08 NOTE — Telephone Encounter (Signed)
Patient called and asked if her Effexor-XR 150 mg 24 hr could be increased. Patient stated she is under a lot of stress. Patient has also been taking her Xanax 0.5 mg once daily as needed but stress not controlled.

## 2021-12-09 NOTE — Telephone Encounter (Signed)
Left VM requesting pt to call the office back 

## 2021-12-15 ENCOUNTER — Encounter: Payer: Self-pay | Admitting: Cardiovascular Disease

## 2021-12-15 NOTE — Progress Notes (Signed)
Office: [phone]  /  Fax: [phone]    Date: December 29, 2021   Appointment Start Time: 4:36pm Duration: 25 minutes Provider: Glennie Isle, Psy.D. Type of Session: Individual Therapy  Location of Patient: Parked in car (safe/private location in [place], [place]) Location of Provider: Provider's Home (private office) Type of Contact: Telepsychological Visit via MyChart Video Visit  Session Content:This provider called Rachel Torres at 4:34pm as she did not present for today's appointment. She indicated she was attempting to join. Due to technical issues on Rachel Torres's end, she provided verbal consent to provide via a regular telephone call. As such, today's appointment was initiated 6 minutes late. Rachel Torres is a 65 y.o. female presenting for a follow-up appointment to address the previously established treatment goal of increasing coping skills.Today's appointment was a telepsychological visit due to COVID-19. Rachel Torres provided verbal consent for today's telepsychological appointment and she is aware she is responsible for securing confidentiality on her end of the session. Prior to proceeding with today's appointment, Rachel Torres's physical location at the time of this appointment was obtained as well a phone number she could be reached at in the event of technical difficulties. Rachel Torres and this provider participated in today's telepsychological service.   This provider conducted a brief check-in. Rachel Torres shared about recent events, noting she is trying to make better choices and engage in portion control when experiencing emotional hunger. She shared she continues to experience "constant stress." This provider checked-in regarding the referral placed with [facility]. She indicated she has an appointment on March 22, 2022. Notably, she indicated her recent trip to [place] was helpful with reducing stress as it was pleasurable. Thus, psychoeducation regarding the importance of self-care  utilizing the oxygen mask metaphor was provided. Psychoeducation regarding pleasurable activities, including its impact on emotional eating and overall well-being was also provided. Rachel Torres was provided with a handout with various options of pleasurable activities, and was encouraged to engage in one activity a day and additional activities as needed when triggered to emotionally eat. Rachel Torres agreed. Rachel Torres provided verbal consent during today's appointment for this provider to send a handout with pleasurable activities via e-mail.  Rachel Torres was receptive to today's appointment as evidenced by openness to sharing, responsiveness to feedback, and willingness to engage in pleasurable activities to assist with coping.  Mental Status Examination:  Appearance: unable to assess  Behavior: unable to assess Mood:  stressed Affect: unable to fully assess Speech: WNL Eye Contact: unable to assess Psychomotor Activity: unable to assess  Gait: unable to assess Thought Process: linear, logical, and goal directed and no evidence or endorsement of suicidal, homicidal, and self-harm ideation, plan and intent  Thought Content/Perception: no hallucinations, delusions, bizarre thinking or behavior endorsed or observed Orientation: AAOx4 Memory/Concentration: memory, attention, language, and fund of knowledge intact  Insight/Judgment: fair  Interventions:  Conducted a brief chart review Provided empathic reflections and validation Employed supportive psychotherapy interventions to facilitate reduced distress and to improve coping skills with identified stressors Psychoeducation provided regarding pleasurable activities Psychoeducation provided regarding self-care  DSM-5 Diagnosis(es):  F50.89 Other Specified Feeding or Eating Disorder, Emotional Eating Behaviors and F43.20 Adjustment Disorder, Unspecified   Treatment Goal & Progress: During the initial appointment with this provider, the following treatment  goal was established: increase coping skills. Progress is limited, as Rachel Torres has just begun treatment with this provider; however, she is receptive to the interaction and interventions and rapport is being established.   Plan: The next appointment will be scheduled in 2-3 weeks, which will  be via [facility] Visit. The next session will focus on working towards the established treatment goal.

## 2021-12-23 NOTE — Telephone Encounter (Signed)
Attempted to reach patient to follow up, no answer. Left voicemail.

## 2021-12-25 MED ORDER — VENLAFAXINE HCL ER 37.5 MG PO CP24: 37.5000 mg | ORAL_CAPSULE | Freq: Every day | ORAL | 1 refills | Status: DC

## 2021-12-25 NOTE — Telephone Encounter (Signed)
Patient returned call. She is still interested in the dose increase. She would like rx sent to [facility] order.  States she was in therapy but her therapist retired. She expects to see new therapist in April. She is thinking dose increase will help her during the in between period.  She would like a call from office to schedule her follow up appt in 1 month.  Debbora Dus, PharmD Clinical Pharmacist Practitioner [facility] at [facility] [phone]

## 2021-12-25 NOTE — Telephone Encounter (Signed)
Rx sent to pharmacy, please call pt and schedule 1 month f/u with PCP

## 2021-12-25 NOTE — Telephone Encounter (Signed)
Call went straight to VM, LM to call back to schedule 26m follow up

## 2021-12-28 ENCOUNTER — Telehealth: Payer: Self-pay

## 2021-12-28 NOTE — Telephone Encounter (Signed)
Was able to reach out to pt via phone and make contact to review Rachel Torres recent ZIO monitor results. Dr. [name] advised based on the current results   Zio monitor  Triggered events not associated with significant arrhythmia  Would continue the carvedilol at current dose  Other short episodes of tachycardia noted, not triggered  Would avoid trying to go up on the Coreg given weight loss and potential for low blood pressure   Rachel Torres verbalized understanding, is thankful for the results call, all questions and concerns were address. Will call back for anything further, f/u as schedule.

## 2021-12-29 ENCOUNTER — Telehealth (INDEPENDENT_AMBULATORY_CARE_PROVIDER_SITE_OTHER): Payer: Medicare HMO | Admitting: Psychology

## 2021-12-29 ENCOUNTER — Other Ambulatory Visit: Payer: Self-pay

## 2021-12-29 DIAGNOSIS — F5089 Other specified eating disorder: Secondary | ICD-10-CM | POA: Diagnosis not present

## 2021-12-29 DIAGNOSIS — F4320 Adjustment disorder, unspecified: Secondary | ICD-10-CM

## 2022-01-07 ENCOUNTER — Other Ambulatory Visit: Payer: Self-pay

## 2022-01-07 ENCOUNTER — Ambulatory Visit (INDEPENDENT_AMBULATORY_CARE_PROVIDER_SITE_OTHER): Payer: Medicare HMO | Admitting: Family Medicine

## 2022-01-07 ENCOUNTER — Encounter (INDEPENDENT_AMBULATORY_CARE_PROVIDER_SITE_OTHER): Payer: Self-pay | Admitting: Family Medicine

## 2022-01-07 VITALS — BP 110/69 | HR 81 | Temp 98.2°F | Ht 65.0 in | Wt 160.0 lb

## 2022-01-07 DIAGNOSIS — R7303 Prediabetes: Secondary | ICD-10-CM | POA: Diagnosis not present

## 2022-01-07 DIAGNOSIS — Z6826 Body mass index (BMI) 26.0-26.9, adult: Secondary | ICD-10-CM

## 2022-01-07 DIAGNOSIS — E669 Obesity, unspecified: Secondary | ICD-10-CM | POA: Diagnosis not present

## 2022-01-07 DIAGNOSIS — E66812 Obesity, class 2: Secondary | ICD-10-CM

## 2022-01-07 MED ORDER — SEMAGLUTIDE (1 MG/DOSE) 4 MG/3ML ~~LOC~~ SOPN: 1.0000 mg | PEN_INJECTOR | SUBCUTANEOUS | 0 refills | Status: DC

## 2022-01-07 NOTE — Progress Notes (Signed)
Chief Complaint:   OBESITY Rachel Torres is here to discuss her progress with her obesity treatment plan along with follow-up of her obesity related diagnoses. Rachel Torres is on following a lower carbohydrate, vegetable and lean protein rich diet plan and states she is following her eating plan approximately 50% of the time. Rachel Torres states she is doing 0 minutes 0 times per week.  Today's visit was #: 22 Starting weight: 213 lbs Starting date: 05/05/2020 Today's weight: 213 lbs Today's date: 01/07/2022 Total lbs lost to date: 53 Total lbs lost since last in-office visit: 1  Interim History: Rachel Torres did very well with avoiding holiday weight gain. She was able to get away from her husband's care-giving for 3 days, and she notes her stress level improved. She continues to be mindful of her portions.  Subjective:   1. Pre-diabetes Rachel Torres continues to do well with diet and weight loss. She notes decreased polyphagia and no signs of hypoglycemia.  Assessment/Plan:   1. Pre-diabetes We will refill Ozempic for 1 month. Rachel Torres will continue to work on weight loss, exercise, and decreasing simple carbohydrates to help decrease the risk of diabetes.   - Semaglutide, 1 MG/DOSE, 4 MG/3ML SOPN; Inject 1 mg as directed once a week.  Dispense: 3 mL; Refill: 0  2. Obesity with current BMI 26.7 Rachel Torres is currently in the action stage of change. As such, her goal is to continue with weight loss efforts. She has agreed to practicing portion control and making smarter food choices, such as increasing vegetables and decreasing simple carbohydrates.   Behavioral modification strategies: increasing lean protein intake.  Rachel Torres has agreed to follow-up with our clinic in 4 weeks. She was informed of the importance of frequent follow-up visits to maximize her success with intensive lifestyle modifications for her multiple health conditions.   Objective:   Blood pressure 110/69, pulse 81, temperature 98.2  F (36.8 C), height 5\' 5"  (1.651 m), weight 160 lb (72.6 kg), SpO2 98 %. Body mass index is 26.63 kg/m.  General: Cooperative, alert, well developed, in no acute distress. HEENT: Conjunctivae and lids unremarkable. Cardiovascular: Regular rhythm.  Lungs: Normal work of breathing. Neurologic: No focal deficits.   Lab Results  Component Value Date   CREATININE 0.77 12/03/2021   BUN 15 12/03/2021   NA 137 12/03/2021   K 4.3 12/03/2021   CL 100 12/03/2021   CO2 22 12/03/2021   Lab Results  Component Value Date   ALT 16 12/03/2021   AST 18 12/03/2021   ALKPHOS 68 12/03/2021   BILITOT 0.5 12/03/2021   Lab Results  Component Value Date   HGBA1C 5.5 12/03/2021   HGBA1C 5.6 07/10/2021   HGBA1C 5.5 02/26/2021   HGBA1C 5.7 (H) 10/29/2020   HGBA1C 5.6 07/23/2020   Lab Results  Component Value Date   INSULIN 4.5 12/03/2021   INSULIN 17.3 02/26/2021   INSULIN 13.9 10/29/2020   INSULIN 22.5 07/23/2020   INSULIN 25.2 (H) 05/05/2020   Lab Results  Component Value Date   TSH 1.54 07/10/2021   Lab Results  Component Value Date   CHOL 195 12/03/2021   HDL 67 12/03/2021   LDLCALC 112 (H) 12/03/2021   LDLDIRECT 189.4 10/13/2011   TRIG 91 12/03/2021   CHOLHDL 3 07/10/2021   Lab Results  Component Value Date   VD25OH 57.7 12/03/2021   VD25OH 74.87 07/10/2021   VD25OH 62.2 02/26/2021   Lab Results  Component Value Date   WBC 5.6 07/10/2021   HGB  13.9 07/10/2021   HCT 41.3 07/10/2021   MCV 96.2 07/10/2021   PLT 274.0 07/10/2021   Lab Results  Component Value Date   FERRITIN 105.0 12/11/2010   Attestation Statements:   Reviewed by clinician on day of visit: allergies, medications, problem list, medical history, surgical history, family history, social history, and previous encounter notes.  Time spent on visit including pre-visit chart review and post-visit care and charting was 30 minutes.    I, Trixie Dredge, am acting as transcriptionist for [name],  MD.  I have reviewed the above documentation for accuracy and completeness, and I agree with the above. -  [name], MD

## 2022-01-08 NOTE — Progress Notes (Signed)
°  Office: [phone]  /  Fax: [phone]    Date: January 19, 2022   Appointment Start Time: 2:38pm Duration: 27 minutes Provider: Glennie Isle, Psy.D. Type of Session: Individual Therapy  Location of Patient: Home (private location)  Location of Provider: Provider's Home (private office) Type of Contact: Telepsychological Visit via MyChart Video Visit  Session Content:This provider called Rachel Torres at 2:36pm as she did not present for today's appointment. She indicated she joined the visit on time. Assistance on connecting was provided. As such, today's appointment was initiated 8 minutes late. Rachel Torres is a 66 y.o. female presenting for a follow-up appointment to address the previously established treatment goal of increasing coping skills.Today's appointment was a telepsychological visit due to COVID-19. Rachel Torres provided verbal consent for today's telepsychological appointment and she is aware she is responsible for securing confidentiality on her end of the session. Prior to proceeding with today's appointment, Rachel Torres's physical location at the time of this appointment was obtained as well a phone number she could be reached at in the event of technical difficulties. Rachel Torres and this provider participated in today's telepsychological service. Of note, today's appointment was switched to a regular telephone call at 2:41pm with Rachel Torres's verbal consent due to technical issues.   This provider conducted a brief check-in. Rachel Torres shared things were going well after the last appointment, but then her niece died by suicide. She further shared about other stressors. Associated thoughts and feelings were processed. She acknowledged engagement in emotional eating behaviors. She was encouraged to focus on protein intake. Psychoeducation provided regarding self-compassion. Rachel Torres was engaged in a self-compassion exercise to help with eating-related challenges and other ongoing stressors. She was  encouraged to regularly ask herself, What do I need right now? She expressed desire to focus on decompressing and noted a plan to color her hair and take a shower after today's appointment Rachel Torres was receptive to today's appointment as evidenced by openness to sharing, responsiveness to feedback, and  willingness to focus on increasing self-compassion .  Mental Status Examination:  Appearance: well groomed and appropriate hygiene  Behavior: appropriate to circumstances Mood: sad Affect: mood congruent Speech: WNL Eye Contact: appropriate Psychomotor Activity: WNL Gait: unable to assess Thought Process: linear, logical, and goal directed and no evidence or endorsement of suicidal, homicidal, and self-harm ideation, plan and intent  Thought Content/Perception: no hallucinations, delusions, bizarre thinking or behavior endorsed or observed Orientation: AAOx4 Memory/Concentration: memory, attention, language, and fund of knowledge intact  Insight/Judgment: fair  Interventions:  Conducted a brief chart review Provided empathic reflections and validation Employed supportive psychotherapy interventions to facilitate reduced distress and to improve coping skills with identified stressors Psychoeducation provided regarding self-compassion Engaged pt in a self-compassion exercise  DSM-5 Diagnosis(es):  F50.89 Other Specified Feeding or Eating Disorder, Emotional Eating Behaviors and F43.20 Adjustment Disorder, Unspecified   Treatment Goal & Progress: During the initial appointment with this provider, the following treatment goal was established: increase coping skills. Rever continues to demonstrate willingness to engage in learned skill(s).  Plan: The next appointment will be scheduled in two weeks, which will be via MyChart Video Visit. The next session will focus on working towards the established treatment goal. Since her appointment with [facility] is scheduled for April,  she noted she requested to be placed on a waiting list.

## 2022-01-13 ENCOUNTER — Other Ambulatory Visit: Payer: Self-pay | Admitting: Cardiovascular Disease

## 2022-01-19 ENCOUNTER — Telehealth (INDEPENDENT_AMBULATORY_CARE_PROVIDER_SITE_OTHER): Payer: Medicare HMO | Admitting: Psychology

## 2022-01-19 DIAGNOSIS — F5089 Other specified eating disorder: Secondary | ICD-10-CM

## 2022-01-19 DIAGNOSIS — F4320 Adjustment disorder, unspecified: Secondary | ICD-10-CM | POA: Diagnosis not present

## 2022-01-19 NOTE — Progress Notes (Signed)
°  Office: [phone]  /  Fax: [phone]    Date: February 02, 2022   Appointment Start Time: 2:03pm Duration: 24 minutes Provider: Glennie Isle, Psy.D. Type of Session: Individual Therapy  Location of Patient: Home (private location) Location of Provider: Provider's Home (private office) Type of Contact: Telepsychological Visit via MyChart Video Visit  Session Content: Lanessa is a 66 y.o. female presenting for a follow-up appointment to address the previously established treatment goal of increasing coping skills.Today's appointment was a telepsychological visit due to COVID-19. Mistey provided verbal consent for today's telepsychological appointment and she is aware she is responsible for securing confidentiality on her end of the session. Prior to proceeding with today's appointment, Allisyn's physical location at the time of this appointment was obtained as well a phone number she could be reached at in the event of technical difficulties. Keoni and this provider participated in today's telepsychological service.   This provider conducted a brief check-in. Jerusalem shared, "I stayed away from the family drama." She indicated ongoing stressors have impacted her eating habits; however, she continues to maintain her weight. Notably, Aaliya described making better choices and engaging in portion control. Psychoeducation regarding emotional versus physical hunger was provided. Atlantis was given a handout to utilize between now and the next appointment to increase awareness of hunger patterns and subsequent eating. Vivyan provided verbal consent during today's appointment for this provider to send a handout about hunger patterns via e-mail. Dilyn was receptive to today's appointment as evidenced by openness to sharing, responsiveness to feedback, and willingness to discuss hunger patterns.  Mental Status Examination:  Appearance: neat Behavior: appropriate to circumstances Mood:  neutral Affect: mood congruent Speech: WNL Eye Contact: appropriate Psychomotor Activity: WNL Gait: unable to assess Thought Process: linear, logical, and goal directed and no evidence or endorsement of suicidal, homicidal, and self-harm ideation, plan and intent  Thought Content/Perception: no hallucinations, delusions, bizarre thinking or behavior endorsed or observed Orientation: AAOx4 Memory/Concentration: memory, attention, language, and fund of knowledge intact  Insight: fair Judgment: fair  Interventions:  Conducted a brief chart review Provided empathic reflections and validation Employed supportive psychotherapy interventions to facilitate reduced distress and to improve coping skills with identified stressors Psychoeducation provided regarding emotional and physical hunger characteristics   DSM-5 Diagnosis(es):  F50.89 Other Specified Feeding or Eating Disorder, Emotional Eating Behaviors and F43.20 Adjustment Disorder, Unspecified   Treatment Goal & Progress: During the initial appointment with this provider, the following treatment goal was established: increase coping skills. Vanice continues to demonstrate willingness to engage in learned skill(s).  Plan: The next appointment will be scheduled in 2-3 weeks, which will be via MyChart Video Visit. The next session will focus on working towards the established treatment goal.

## 2022-02-02 ENCOUNTER — Telehealth (INDEPENDENT_AMBULATORY_CARE_PROVIDER_SITE_OTHER): Payer: Medicare HMO | Admitting: Psychology

## 2022-02-02 DIAGNOSIS — F5089 Other specified eating disorder: Secondary | ICD-10-CM

## 2022-02-02 DIAGNOSIS — F4320 Adjustment disorder, unspecified: Secondary | ICD-10-CM | POA: Diagnosis not present

## 2022-02-04 ENCOUNTER — Other Ambulatory Visit: Payer: Self-pay

## 2022-02-04 ENCOUNTER — Ambulatory Visit (INDEPENDENT_AMBULATORY_CARE_PROVIDER_SITE_OTHER): Payer: Medicare HMO | Admitting: Family Medicine

## 2022-02-04 ENCOUNTER — Encounter (INDEPENDENT_AMBULATORY_CARE_PROVIDER_SITE_OTHER): Payer: Self-pay | Admitting: Family Medicine

## 2022-02-04 VITALS — BP 120/65 | HR 70 | Temp 98.0°F | Ht 65.0 in | Wt 162.0 lb

## 2022-02-04 DIAGNOSIS — R7303 Prediabetes: Secondary | ICD-10-CM

## 2022-02-04 DIAGNOSIS — I10 Essential (primary) hypertension: Secondary | ICD-10-CM

## 2022-02-04 DIAGNOSIS — Z6827 Body mass index (BMI) 27.0-27.9, adult: Secondary | ICD-10-CM

## 2022-02-04 DIAGNOSIS — E559 Vitamin D deficiency, unspecified: Secondary | ICD-10-CM

## 2022-02-04 DIAGNOSIS — E669 Obesity, unspecified: Secondary | ICD-10-CM

## 2022-02-04 DIAGNOSIS — E7849 Other hyperlipidemia: Secondary | ICD-10-CM | POA: Diagnosis not present

## 2022-02-04 MED ORDER — SEMAGLUTIDE (1 MG/DOSE) 4 MG/3ML ~~LOC~~ SOPN: 1.0000 mg | PEN_INJECTOR | SUBCUTANEOUS | 0 refills | Status: DC

## 2022-02-04 NOTE — Progress Notes (Signed)
Chief Complaint:   OBESITY Rachel Torres is here to discuss her progress with her obesity treatment plan along with follow-up of her obesity related diagnoses. Rachel Torres is on practicing portion control and making smarter food choices, such as increasing vegetables and decreasing simple carbohydrates and states she is following her eating plan approximately 75% of the time. Rachel Torres states she is doing a little exercise 2 times per week.    Today's visit was #: 23 Starting weight: 213 lbs Starting date: 05/05/2020 Today's weight: 162 lbs Today's date: 02/04/2022 Total lbs lost to date: 51 Total lbs lost since last in-office visit: 0  Interim History: Rachel Torres has overall done well with weight loss. Since her last visit she has had multiple stressors. 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  Subjective:   1. Pre-diabetes Rachel Torres is taking Ozempic 1 mg, and she denies side effects. Last A1c looked better at 5.5.  2. Other hyperlipidemia Rachel Torres is taking Lipitor 20 mg, and she denies side effects.  3. Vitamin D deficiency Rachel Torres is taking Vit D 2,000 IU OTC, and she denies side effects. Last Vit D level was within normal limits.  4. Essential hypertension Rachel Torres is taking lisinopril 5 mg 1/2 tablet. She reports some dizziness with standing. Some blood pressure at home have been 102/60 and dropped to 68/35.  Assessment/Plan:   1. Pre-diabetes We will refill Ozempic 1 mg once weekly for 1 month. Side effects were discussed. Rachel Torres will continue working on dietary change, exercise, and weight loss to help decrease the risk of diabetes.   2. Other hyperlipidemia Cardiovascular risk and specific lipid/LDL goals reviewed. Rachel Torres will continue to follow up with her primary care physician, and will continue her medication as directed. She will continue working on dietary change, exercise, and weight loss. Orders and  follow up as documented in patient record.   3. Vitamin D deficiency Low Vitamin D level contributes to fatigue and are associated with obesity, breast, and colon cancer. Rachel Torres will continue Vitamin D 2,000 IU daily as directed. She and will follow-up for routine testing of Vitamin D, at least 2-3 times per year to avoid over-replacement.  4. Essential hypertension I have asked Rachel Torres to reach out to her Cardiologist to discuss her low blood pressures. She will continue working on dietary changes and weight loss. We will watch for signs of hypotension as she continues her lifestyle modifications.  5. Obesity with current BMI 27.0 Rachel Torres is currently in the action stage of change. As such, her goal is to continue with weight loss efforts. She has agreed to following a lower carbohydrate, vegetable and lean protein rich diet plan.   Rachel Torres is to keep her follow up appointment with Rachel Torres.   Exercise goals: As is.  Behavioral modification strategies: increasing lean protein intake, increasing water intake, no skipping meals, and meal planning and cooking strategies.  Rachel Torres has agreed to follow-up with our clinic in 4 weeks. She was informed of the importance of frequent follow-up visits to maximize her success with intensive lifestyle modifications for her multiple health conditions.   Objective:   Blood pressure 120/65, pulse 70, temperature 98 F (36.7 C), height 5\' 5"  (1.651 m), weight 162 lb (73.5 kg), SpO2 99 %. Body mass index is 26.96 kg/m.  General: Cooperative, alert, well developed, in no acute distress. HEENT: Conjunctivae and lids unremarkable. Cardiovascular: Regular rhythm.  Lungs: Normal work  of breathing. Neurologic: No focal deficits.   Lab Results  Component Value Date   CREATININE 0.77 12/03/2021   BUN 15 12/03/2021   NA 137 12/03/2021   K 4.3 12/03/2021   CL 100 12/03/2021   CO2 22 12/03/2021   Lab Results  Component Value Date   ALT 16  12/03/2021   AST 18 12/03/2021   ALKPHOS 68 12/03/2021   BILITOT 0.5 12/03/2021   Lab Results  Component Value Date   HGBA1C 5.5 12/03/2021   HGBA1C 5.6 07/10/2021   HGBA1C 5.5 02/26/2021   HGBA1C 5.7 (H) 10/29/2020   HGBA1C 5.6 07/23/2020   Lab Results  Component Value Date   INSULIN 4.5 12/03/2021   INSULIN 17.3 02/26/2021   INSULIN 13.9 10/29/2020   INSULIN 22.5 07/23/2020   INSULIN 25.2 (H) 05/05/2020   Lab Results  Component Value Date   TSH 1.54 07/10/2021   Lab Results  Component Value Date   CHOL 195 12/03/2021   HDL 67 12/03/2021   LDLCALC 112 (H) 12/03/2021   LDLDIRECT 189.4 10/13/2011   TRIG 91 12/03/2021   CHOLHDL 3 07/10/2021   Lab Results  Component Value Date   VD25OH 57.7 12/03/2021   VD25OH 74.87 07/10/2021   VD25OH 62.2 02/26/2021   Lab Results  Component Value Date   WBC 5.6 07/10/2021   HGB 13.9 07/10/2021   HCT 41.3 07/10/2021   MCV 96.2 07/10/2021   PLT 274.0 07/10/2021   Lab Results  Component Value Date   FERRITIN 105.0 12/11/2010   Attestation Statements:   Reviewed by clinician on day of visit: allergies, medications, problem list, medical history, surgical history, family history, social history, and previous encounter notes.   I, Trixie Dredge, am acting as transcriptionist for [name], MD.  I have reviewed the above documentation for accuracy and completeness, and I agree with the above. -  [name], MD

## 2022-02-09 NOTE — Progress Notes (Unsigned)
?  Office: [phone]  /  Fax: [phone] ? ? ? ?Date: February 23, 2022   ?Appointment Start Time: *** ?Duration: *** minutes ?Provider: Glennie Isle, Psy.D. ?Type of Session: Individual Therapy  ?Location of Patient: {gbptloc:23249} ?Location of Provider: Provider's Home (private office) ?Type of Contact: Telepsychological Visit via MyChart Video Visit ? ?Session Content: Rachel Torres is a 66 y.o. female presenting for a follow-up appointment to address the previously established treatment goal of increasing coping skills.Today's appointment was a telepsychological visit due to COVID-19. Suan provided verbal consent for today's telepsychological appointment and she is aware she is responsible for securing confidentiality on her end of the session. Prior to proceeding with today's appointment, Mollee's physical location at the time of this appointment was obtained as well a phone number she could be reached at in the event of technical difficulties. Kaneesha and this provider participated in today's telepsychological service.  ? ?This provider conducted a brief check-in. *** Meko was receptive to today's appointment as evidenced by openness to sharing, responsiveness to feedback, and {gbreceptiveness:23401}. ? ?Mental Status Examination:  ?Appearance: {Appearance:22431} ?Behavior: {Behavior:22445} ?Mood: {gbmood:21757} ?Affect: {Affect:22436} ?Speech: {Speech:22432} ?Eye Contact: {Eye Contact:22433} ?Psychomotor Activity: {Motor Activity:22434} ?Gait: {gbgait:23404} ?Thought Process: {thought process:22448}  ?Thought Content/Perception: {disturbances:22451} ?Orientation: {Orientation:22437} ?Memory/Concentration: {gbcognition:22449} ?Insight: {Insight:22446} ?Judgment: {Insight:22446} ? ?Interventions:  ?{Interventions for Progress Notes:23405} ? ?DSM-5 Diagnosis(es): F50.89 Other Specified Feeding or Eating Disorder, Emotional Eating Behaviors and  F43.20 Adjustment Disorder, Unspecified  ? ?Treatment Goal &  Progress: During the initial appointment with this provider, the following treatment goal was established: increase coping skills. Shaney has demonstrated progress in her goal as evidenced by {gbtxprogress:22839}. Paitlyn also {gbtxprogress2:22951}. ? ?Plan: The next appointment will be scheduled in {gbweeks:21758}, which will be {gbtxmodality:23402}. The next session will focus on {Plan for Next Appointment:23400}. ? ?

## 2022-02-19 ENCOUNTER — Telehealth: Payer: Self-pay

## 2022-02-19 NOTE — Progress Notes (Signed)
? ? ?Chronic Care Management ?Pharmacy Assistant  ? ?Name: Rachel Torres  MRN: [id number] DOB: [birth year] ? ?Reason for Encounter: CCM (Chronic Heart Failure) ?  ?Recent office visits:  ?12/08/2021 - Rachel Pardon, MD - Increase in Effexor XR - add 37.5 mg daily to the 150 mg already taking. ? ?Recent consult visits:  ?02/04/2022 - Rachel Nip, MD - Family Medicine - Patient presented for pre-diabetes. No medication changes.  ?01/07/2022 - Rachel Nip, MD - Family Medicine - Patient presented for pre-diabetes. No medication changes.  ? ?Video Visits with Rachel Isle, PsyD - Psychology  ?- No information provided for visits.  ? ?Hospital visits:  ?None in previous 6 months ? ?Medications: ?Outpatient Encounter Medications as of 02/19/2022  ?Medication Sig  ? acetaminophen (TYLENOL) 500 MG tablet Take 500 mg by mouth every 6 (six) hours as needed.  ? albuterol (VENTOLIN HFA) 108 (90 Base) MCG/ACT inhaler Inhale 1-2 puffs into the lungs every 6 (six) hours as needed for wheezing or shortness of breath.  ? ALPRAZolam (XANAX) 0.5 MG tablet Take 1 tablet (0.5 mg total) by mouth daily as needed for anxiety.  ? aspirin EC 81 MG tablet Take 81 mg by mouth at bedtime.  ? atorvastatin (LIPITOR) 20 MG tablet Take 1 tablet (20 mg total) by mouth daily.  ? carvedilol (COREG) 6.25 MG tablet TAKE 1 TABLET TWICE DAILY WITH A MEAL  ? Cholecalciferol (VITAMIN D3) 50 MCG (2000 UT) CAPS Take 2,000 capsules by mouth.  ? clindamycin (CLEOCIN) 300 MG capsule   ? cyclobenzaprine (FLEXERIL) 10 MG tablet Take 0.5-1 tablets (5-10 mg total) by mouth 3 (three) times daily as needed. For headache  ? furosemide (LASIX) 20 MG tablet Take 1 tablet (20 mg total) by mouth every other day. And as needed for swelling or shortness of breath  ? Insulin Pen Needle (BD PEN NEEDLE NANO U/F) 32G X 4 MM MISC Use Nano needle with Ozempic  ? Krill Oil (OMEGA-3) 500 MG CAPS Take 1 capsule by mouth daily.  ? lisinopril (ZESTRIL) 5 MG tablet TAKE 1/2 TABLET  EVERY DAY.  ? magnesium gluconate (MAGONATE) 500 MG tablet Take 500 mg by mouth at bedtime.  ? Melatonin 5 MG SUBL Place under the tongue.  ? Misc Natural Products (TART CHERRY ADVANCED) CAPS Take 1 capsule by mouth daily.  ? naproxen sodium (ANAPROX) 220 MG tablet Take 440 mg by mouth 2 (two) times daily as needed (for headaches/pain).  ? nystatin (MYCOSTATIN/NYSTOP) powder Apply 1 application topically 2 (two) times daily. Apply to affected area twice a day for 14 days  ? omeprazole (PRILOSEC) 40 MG capsule TAKE 1 CAPSULE EVERY DAY  ? polyethylene glycol powder (GLYCOLAX/MIRALAX) 17 GM/SCOOP powder Take 17 g by mouth as needed.  ? potassium chloride (KLOR-CON) 10 MEQ tablet Take 1 tablet (10 mEq total) by mouth every other day. Take with Lasix  ? promethazine (PHENERGAN) 25 MG tablet Take 0.5 tablets (12.5 mg total) by mouth every 8 (eight) hours as needed for nausea.  ? Semaglutide, 1 MG/DOSE, 4 MG/3ML SOPN Inject 1 mg as directed once a week.  ? Simethicone (MYLANTA GAS PO) Take by mouth 3 times/day as needed-between meals & bedtime.  ? venlafaxine XR (EFFEXOR XR) 37.5 MG 24 hr capsule Take 1 capsule (37.5 mg total) by mouth daily with breakfast.  ? venlafaxine XR (EFFEXOR-XR) 150 MG 24 hr capsule TAKE 1 CAPSULE EVERY DAY WITH BREAKFAST  ? ?No facility-administered encounter medications on file as of 02/19/2022.  ? ?  Contacted patient on 02/25/2022 to discuss CHF disease state ? ?Current heart failure regimen: ?Aspirin EC 81 mg - 1 tablet at bedtime ?Furosemide 20 mg - 1 tablet every other day ?Atorvastatin 20 mg - 1 tablet daily ?Carvedilol 6.25 mg - 1 tablet twice daily with meal ?Lisinopril 5 mg - 0.5 tablet every day ? ?Are you taking a diuretic? Yes Furosemide 20 mg  ?How often: 1 tablet every other day ? ?Do you weigh yourself daily or regularly? Yes regularly  ? ?Have any of the following symptoms worsened or changed from your baseline?  ? denies worsening of SOB, increased swelling, or abnormal weight gain  of more than 3 pounds in one day or 5 pounds in one week ? ?Do you see a cardiologist? Yes ?            Name: Rachel Rogue, MD ?Last visit? 11/02/2021 ?Upcoming visit? No upcoming appts ? ?How often are you checking your Blood Pressure? infrequently ? ?Wrist or arm cuff: Arm cuff ?Caffeine intake: Patient only drinks decaf coffee  and will drink regular tea 3x a week ?Salt intake: She watches her salt intake ?OTC medications including pseudoephedrine or NSAIDs? Tylenol - few times a month ?Exercise habits: Patient is not exercising a lot; doing crafts.  ? ?Patient has a therapist appointment for March 22, 2022.  ? ?Patient also wanted to let Dr. Glori Torres know that her niece, who was a patient of Dr. Marliss Torres as well, passed away by suicide on 02-01-2023. Her name was Rachel Torres.  ? ?Star Rating Drugs:  ?Medication:  Last Fill: Day Supply ?Atorvastatin 20 mg 01/07/2022 90 ?Lisinopril 5 mg  01/07/2022 90 ? ?Care Gaps: ?Annual wellness visit in last year? Yes 10/29/2021  ?Most Recent BP reading: 120/65 on 02/04/2022 ? ?No appointments scheduled within the next 30 days. ? ?Charlene Brooke, CPP notified ? ?[name], RMA ?Clinical Pharmacy Assistant ?[phone] ? ? ? ? ? ? ? ? ? ? ?

## 2022-02-23 ENCOUNTER — Telehealth (INDEPENDENT_AMBULATORY_CARE_PROVIDER_SITE_OTHER): Payer: Medicare HMO | Admitting: Psychology

## 2022-02-25 ENCOUNTER — Encounter (INDEPENDENT_AMBULATORY_CARE_PROVIDER_SITE_OTHER): Payer: Self-pay

## 2022-02-26 NOTE — Progress Notes (Unsigned)
°  Office: [phone]  /  Fax: [phone]    Date: 03/08/2022   Appointment Start Time: *** Duration: *** minutes Provider: Glennie Isle, Psy.D. Type of Session: Individual Therapy  Location of Patient: {gbptloc:23249} Location of Provider: Provider's Home (private office) Type of Contact: Telepsychological Visit via MyChart Video Visit  Session Content: Rachel Torres is a 66 y.o. female presenting for a follow-up appointment to address the previously established treatment goal of increasing coping skills.Today's appointment was a telepsychological visit due to COVID-19. [name] provided verbal consent for today's telepsychological appointment and she is aware she is responsible for securing confidentiality on her end of the session. Prior to proceeding with today's appointment, [name]'s physical location at the time of this appointment was obtained as well a phone number she could be reached at in the event of technical difficulties. [name] and this provider participated in today's telepsychological service.   This provider conducted a brief check-in. *** [name] was receptive to today's appointment as evidenced by openness to sharing, responsiveness to feedback, and {gbreceptiveness:23401}.  Mental Status Examination:  Appearance: {Appearance:22431} Behavior: {Behavior:22445} Mood: {gbmood:21757} Affect: {Affect:22436} Speech: {Speech:22432} Eye Contact: {Eye Contact:22433} Psychomotor Activity: {Motor Activity:22434} Gait: {gbgait:23404} Thought Process: {thought process:22448}  Thought Content/Perception: {disturbances:22451} Orientation: {Orientation:22437} Memory/Concentration: {gbcognition:22449} Insight: {Insight:22446} Judgment: {Insight:22446}  Interventions:  {Interventions for Progress Notes:23405}  DSM-5 Diagnosis(es):  F50.89 Other Specified Feeding or Eating Disorder, Emotional Eating Behaviors and F43.20 Adjustment Disorder, Unspecified   Treatment Goal &  Progress: During the initial appointment with this provider, the following treatment goal was established: increase coping skills. [name] has demonstrated progress in her goal as evidenced by {gbtxprogress:22839}. [name] also {gbtxprogress2:22951}.  Plan: The next appointment will be scheduled in {gbweeks:21758}, which will be {gbtxmodality:23402}. The next session will focus on {Plan for Next Appointment:23400}.

## 2022-03-04 ENCOUNTER — Ambulatory Visit (INDEPENDENT_AMBULATORY_CARE_PROVIDER_SITE_OTHER): Payer: Medicare HMO | Admitting: Family Medicine

## 2022-03-08 ENCOUNTER — Telehealth (INDEPENDENT_AMBULATORY_CARE_PROVIDER_SITE_OTHER): Payer: Medicare HMO | Admitting: Psychology

## 2022-03-08 DIAGNOSIS — F5089 Other specified eating disorder: Secondary | ICD-10-CM | POA: Diagnosis not present

## 2022-03-08 DIAGNOSIS — F4320 Adjustment disorder, unspecified: Secondary | ICD-10-CM

## 2022-03-16 ENCOUNTER — Other Ambulatory Visit: Payer: Self-pay

## 2022-03-16 ENCOUNTER — Ambulatory Visit (INDEPENDENT_AMBULATORY_CARE_PROVIDER_SITE_OTHER): Payer: Medicare HMO | Admitting: Family Medicine

## 2022-03-16 ENCOUNTER — Encounter (INDEPENDENT_AMBULATORY_CARE_PROVIDER_SITE_OTHER): Payer: Self-pay | Admitting: Family Medicine

## 2022-03-16 VITALS — BP 107/67 | HR 75 | Temp 98.0°F | Ht 65.0 in | Wt 160.0 lb

## 2022-03-16 DIAGNOSIS — R7303 Prediabetes: Secondary | ICD-10-CM | POA: Diagnosis not present

## 2022-03-16 DIAGNOSIS — Z6826 Body mass index (BMI) 26.0-26.9, adult: Secondary | ICD-10-CM | POA: Diagnosis not present

## 2022-03-16 DIAGNOSIS — E669 Obesity, unspecified: Secondary | ICD-10-CM | POA: Diagnosis not present

## 2022-03-17 ENCOUNTER — Ambulatory Visit (INDEPENDENT_AMBULATORY_CARE_PROVIDER_SITE_OTHER): Payer: Medicare HMO | Admitting: Family Medicine

## 2022-03-17 ENCOUNTER — Encounter: Payer: Self-pay | Admitting: Family Medicine

## 2022-03-17 VITALS — BP 132/70 | HR 78 | Temp 97.3°F | Ht 65.0 in | Wt 164.2 lb

## 2022-03-17 DIAGNOSIS — F418 Other specified anxiety disorders: Secondary | ICD-10-CM

## 2022-03-17 DIAGNOSIS — G43009 Migraine without aura, not intractable, without status migrainosus: Secondary | ICD-10-CM | POA: Diagnosis not present

## 2022-03-17 DIAGNOSIS — E7849 Other hyperlipidemia: Secondary | ICD-10-CM

## 2022-03-17 DIAGNOSIS — J309 Allergic rhinitis, unspecified: Secondary | ICD-10-CM | POA: Diagnosis not present

## 2022-03-17 DIAGNOSIS — R7303 Prediabetes: Secondary | ICD-10-CM

## 2022-03-17 LAB — BASIC METABOLIC PANEL
BUN: 12 mg/dL (ref 6–23)
CO2: 27 mEq/L (ref 19–32)
Calcium: 10.1 mg/dL (ref 8.4–10.5)
Chloride: 102 mEq/L (ref 96–112)
Creatinine, Ser: 0.82 mg/dL (ref 0.40–1.20)
GFR: 75.6 mL/min (ref >60.00)
Glucose, Bld: 89 mg/dL (ref 70–99)
Potassium: 4.7 mEq/L (ref 3.5–5.1)
Sodium: 136 mEq/L (ref 135–145)

## 2022-03-17 LAB — HEMOGLOBIN A1C: Hgb A1c MFr Bld: 5.5 % (ref 4.6–6.5)

## 2022-03-17 LAB — LIPID PANEL
Cholesterol: 182 mg/dL (ref 0–200)
HDL: 63.3 mg/dL (ref >39.00)
LDL Cholesterol: 105 mg/dL — ABNORMAL HIGH (ref 0–99)
NonHDL: 119.08
Total CHOL/HDL Ratio: 3
Triglycerides: 70 mg/dL (ref 0.0–149.0)
VLDL: 14 mg/dL (ref 0.0–40.0)

## 2022-03-17 MED ORDER — CETIRIZINE HCL 10 MG PO TABS: 10.0000 mg | ORAL_TABLET | Freq: Every day | ORAL | 3 refills | Status: DC

## 2022-03-17 MED ORDER — CYCLOBENZAPRINE HCL 10 MG PO TABS: 5.0000 mg | ORAL_TABLET | Freq: Three times a day (TID) | ORAL | 3 refills | Status: DC | PRN

## 2022-03-17 MED ORDER — ALPRAZOLAM 0.5 MG PO TABS: 0.5000 mg | ORAL_TABLET | Freq: Every day | ORAL | 0 refills | Status: DC | PRN

## 2022-03-17 MED ORDER — PROMETHAZINE HCL 25 MG PO TABS: 12.5000 mg | ORAL_TABLET | Freq: Three times a day (TID) | ORAL | 3 refills | Status: DC | PRN

## 2022-03-17 MED ORDER — FLUTICASONE PROPIONATE 50 MCG/ACT NA SUSP: 2.0000 | Freq: Every day | NASAL | 3 refills | Status: DC

## 2022-03-17 NOTE — Assessment & Plan Note (Addendum)
Disc goals for lipids and reasons to control them ?Rev last labs with pt ?Rev low sat fat diet in detail ?Under care of cardiology  ?Lipid panel ordered  ?Taking atorvastatin 20 mg four times weekly  ?Commended better diet and weight loss ?

## 2022-03-17 NOTE — Assessment & Plan Note (Signed)
Headaches have been mildly worse lately due to stressors and weather change with allergy season ?Patient continues to use Flexeril 5 to 10 mg up to 3 times a day as needed and also Phenergan 12.5 mg every 8 hours as needed for nausea ?These were refilled today ?Discussed healthy habits for headache prevention ?Also work on allergy control ?

## 2022-03-17 NOTE — Assessment & Plan Note (Signed)
Refilled flonase to use once daily in allergy season  ?Allergen avoidance if able  ?

## 2022-03-17 NOTE — Assessment & Plan Note (Signed)
A1C ordered ?disc imp of low glycemic diet and wt loss to prevent DM2  ?Weight loss commended  ?

## 2022-03-17 NOTE — Assessment & Plan Note (Signed)
Patient was previously taking Effexor XR 150 mg daily ?After last visit we added another 37.5 mg daily to that  ?This is helped mood significantly and pt remains motivated for self care  ?Reviewed stressors/ coping techniques/symptoms/ support sources/ tx options and side effects in detail today  ?

## 2022-03-17 NOTE — Progress Notes (Signed)
? ?Subjective:  ? ? Patient ID: Rachel Torres, female    DOB: 1956-05-20, 66 y.o.   MRN: [id number] ? ?This visit occurred during the SARS-CoV-2 public health emergency.  Safety protocols were in place, including screening questions prior to the visit, additional usage of staff PPE, and extensive cleaning of exam room while observing appropriate contact time as indicated for disinfecting solutions.  ? ?HPI ?Pt presents for f/u of chronic problems and med refill  ? ?Wt Readings from Last 3 Encounters:  ?03/17/22 164 lb 4 oz (74.5 kg)  ?03/16/22 160 lb (72.6 kg)  ?02/04/22 162 lb (73.5 kg)  ? ?27.33 kg/m? ? ?Lost weight /thrilled  ?Feels better!  ?Taking care of herself  ? ?Incorporating a little more carb  ?Lots of protein and fiber ? ?Was able to cut cardiac/bp meds to 4 times weekly  ? ?Wants to check lab for a1c and lipids ?Lab Results  ?Component Value Date  ? HGBA1C 5.5 12/03/2021  ? ?Lab Results  ?Component Value Date  ? CHOL 195 12/03/2021  ? HDL 67 12/03/2021  ? LDLCALC 112 (H) 12/03/2021  ? LDLDIRECT 189.4 10/13/2011  ? TRIG 91 12/03/2021  ? CHOLHDL 3 07/10/2021  ? ? ? ?Anxiety : increased effexor  ?Her niece -lost to suicide / devastating  ?Alprazolam : does not use often , needs refill  ? ?Flexeril -for headache, helps  ?Headaches get bad at times  ? ?Promethazine-headache/nausea ? ?Flonase-allergies / wants to get back on track  ? ?Patient Active Problem List  ? Diagnosis Date Noted  ? Medicare annual wellness visit, initial 07/17/2021  ? TMJ (dislocation of temporomandibular joint) 07/17/2021  ? Encounter for screening mammogram for breast cancer 06/05/2021  ? Diarrhea 04/11/2020  ? Epigastric pain 02/19/2020  ? Dark stools 02/19/2020  ? PAF (paroxysmal atrial fibrillation) ([place]) 02/05/2019  ? Shortness of breath 01/27/2019  ? Irregular surface of cornea 12/25/2018  ? HPV (human papilloma virus) infection 12/01/2016  ? Screening mammogram, encounter for 11/29/2016  ? Estrogen deficiency 11/29/2016  ?  Encounter for routine gynecological examination 11/29/2016  ? Grade III hemorrhoids 10/06/2016  ? Tachycardia 08/14/2016  ? Hemorrhoids 08/13/2016  ? Atypical migraine 08/03/2016  ? CAD (coronary artery disease)   ? AAA (abdominal aortic aneurysm)   ? Chronic combined systolic (congestive) and diastolic (congestive) heart failure (HCC)   ? Prediabetes 02/11/2016  ? Generalized anxiety disorder 12/08/2015  ? Panic attacks 07/28/2015  ? Sensorineural hearing loss 03/27/2013  ? Vitamin D deficiency 07/03/2012  ? H/O aortic dissection 05/12/2012  ? Routine general medical examination at a health care facility 09/20/2011  ? S/P AVR (aortic valve replacement) 01/07/2011  ? CORONARY ARTERY BYPASS GRAFT, HX OF 01/07/2011  ? Arthritis 12/20/2010  ? Back pain 12/15/2010  ? H/O dissecting abdominal aortic aneurysm repair 08/24/2010  ? IRRITABLE BOWEL SYNDROME 09/09/2009  ? Obstructive sleep apnea 08/04/2009  ? External hemorrhoid 06/26/2009  ? Osteoporosis 01/09/2009  ? Hyperlipidemia 07/26/2007  ? Depression with anxiety 07/26/2007  ? Essential hypertension 07/26/2007  ? Allergic rhinitis 07/26/2007  ? Asthma 07/26/2007  ? GERD 07/26/2007  ? Osteoarthritis 07/26/2007  ? Ocular migraine 07/26/2007  ? Mild intermittent asthma without complication 41/96/2229  ? ?Past Medical History:  ?Diagnosis Date  ? AAA (abdominal aortic aneurysm)   ? a. Chronic w/o evidence of aneurysmal dil on CTA 01/2016.  ? Alcohol abuse   ? Allergy   ? Anemia   ? Anxiety   ? Asthma   ?  CAD (coronary artery disease)   ? a. 08/2010 s/p CABG x 1 (VG->RCA) @ time of Ao dissection repair; b. 01/2016 Lexiscan MV: mid antsept/apical defect w/ ? peri-infarct ischemia-->likely attenuation-->Med Rx.  ? Chewing difficulty   ? Chronic combined systolic (congestive) and diastolic (congestive) heart failure (HCC)   ? a. 2012 EF 30-35%; b. 04/2015 EF 40-45%; c. 01/2016 Echo: Ef 55-65%, nl AoV bioprosthesis, nl RV, nl PASP.  ? Constipation   ? Depression   ? Edema of both  lower extremities   ? Gallbladder sludge   ? GERD (gastroesophageal reflux disease)   ? Heart failure ([place])   ? Heart valve problem   ? History of Bicuspid Aortic Valve   ? a. 08/2010 s/p AVR @ time of Ao dissection repair; b. 01/2016 Echo: Ef 55-65%, nl AoV bioprosthesis, nl RV, nl PASP.  ? Hx of repair of dissecting thoracic aortic aneurysm, Stanford type A   ? a. 08/2010 s/p repair with AVR and VG->RCA; b. 01/2016 CTA: stable appearance of Asc Thoracic Aortic graft. Opacification of flase lumen of chronic abd Ao dissection w/ retrograde flow through lumbar arteries. No aneurysmal dil of flase lumen.  ? Hyperlipidemia   ? Hypertension   ? Hypertensive heart disease   ? IBS (irritable bowel syndrome)   ? Internal hemorrhoids   ? Kidney problem   ? Marfan syndrome   ? pt denies  ? Migraine   ? Obstructive sleep apnea   ? Osteoarthritis   ? Osteoporosis   ? Palpitations   ? Pneumonia   ? RA (rheumatoid arthritis) ([place])   ? a. Followed by Dr. [name]  ? ?Past Surgical History:  ?Procedure Laterality Date  ? ABDOMINAL AORTIC ANEURYSM REPAIR    ? CARDIAC CATHETERIZATION  2001  ? [place]  ? CHOLECYSTECTOMY    ? COLONOSCOPY    ? ESOPHAGOGASTRODUODENOSCOPY (EGD) WITH PROPOFOL N/A 05/08/2020  ? Procedure: ESOPHAGOGASTRODUODENOSCOPY (EGD) WITH PROPOFOL;  Surgeon: Virgel Manifold, MD;  Location: [facility] ENDOSCOPY;  Service: Endoscopy;  Laterality: N/A;  ? FRACTURE SURGERY Right   ? shoulder replacement  ? HEMORRHOID BANDING    ? ORIF DISTAL RADIUS FRACTURE Left   ? UPPER GASTROINTESTINAL ENDOSCOPY    ? ?Social History  ? ?Tobacco Use  ? Smoking status: Former  ?  Packs/day: 0.75  ?  Years: 1.50  ?  Pack years: 1.13  ?  Types: Cigarettes  ?  Quit date: 12/20/1978  ?  Years since quitting: 43.2  ? Smokeless tobacco: Never  ?Vaping Use  ? Vaping Use: Never used  ?Substance Use Topics  ? Alcohol use: Yes  ?  Alcohol/week: 0.0 standard drinks  ?  Comment: 2-3 glasses of red wine per week  ? Drug use: No  ? ?Family  History  ?Problem Relation Age of Onset  ? Hypertension Mother   ? Depression Mother   ? Osteoporosis Mother   ? Stroke Mother   ? Irritable bowel syndrome Mother   ? Asthma Mother   ? Heart disease Mother   ? Thyroid disease Mother   ? Anxiety disorder Mother   ? Arthritis Sister   ? Diabetes Sister   ? Heart disease Sister   ? Asthma Sister   ? Migraines Sister   ? Nephrolithiasis Sister   ? Osteoporosis Sister   ? Arthritis Sister   ? Asthma Sister   ? Migraines Sister   ? Other Father 11  ? Stroke Maternal Grandmother   ?  Colon cancer Maternal Grandmother   ? Lymphoma Maternal Grandmother   ? Migraines Maternal Grandmother   ? Aneurysm Paternal Grandmother   ?     brain  ? Diabetes Paternal Grandmother   ? Arthritis Paternal Grandmother   ? Arthritis Paternal Grandfather   ? Diabetes Paternal Grandfather   ? Other Brother   ?     prediabeties  ? Asthma Brother   ? Migraines Brother   ? Lung cancer Maternal Grandfather   ? Melanoma Maternal Grandfather   ? ?Allergies  ?Allergen Reactions  ? Fosamax [Alendronate Sodium]   ?  GI upset ?  ? Ginzing [Ginseng] Other (See Comments)  ?  Hyper and jitery   ? Hydrocod Poli-Chlorphe Poli Er Itching  ? Hydrocodone-Acetaminophen Itching  ? Imitrex [Sumatriptan]   ?  Contraindicated w/ heart condition  ? Oxycodone Itching  ? Peanut-Containing Drug Products Other (See Comments)  ?  Reaction:  Migraines   ? Tramadol Other (See Comments)  ?  itching  ? Hydrocodone-Guaifenesin Itching  ? Prednisone Rash  ? Tape Itching  ? ?Current Outpatient Medications on File Prior to Visit  ?Medication Sig Dispense Refill  ? acetaminophen (TYLENOL) 500 MG tablet Take 500 mg by mouth every 6 (six) hours as needed.    ? albuterol (VENTOLIN HFA) 108 (90 Base) MCG/ACT inhaler Inhale 1-2 puffs into the lungs every 6 (six) hours as needed for wheezing or shortness of breath. 3 each 3  ? aspirin EC 81 MG tablet Take 81 mg by mouth at bedtime.    ? atorvastatin (LIPITOR) 20 MG tablet Take 1 tablet (20  mg total) by mouth daily. (Patient taking differently: Take 20 mg by mouth. FOUR TIMES A WEEK) 90 tablet 3  ? carvedilol (COREG) 6.25 MG tablet TAKE 1 TABLET TWICE DAILY WITH A MEAL 180 tablet 3  ?

## 2022-03-17 NOTE — Progress Notes (Signed)
? ? ? ?Chief Complaint:  ? ?OBESITY ?Rachel Torres is here to discuss her progress with her obesity treatment plan along with follow-up of her obesity related diagnoses. Rachel Torres is on following a lower carbohydrate, vegetable and lean protein rich diet plan and states she is following her eating plan approximately 60% of the time. Rachel Torres states she is doing 0 minutes 0 times per week. ? ?Today's visit was #: 24 ?Starting weight: 213 lbs ?Starting date: 05/05/2020 ?Today's weight: 160 lbs ?Today's date: 03/16/2022 ?Total lbs lost to date: 38 ?Total lbs lost since last in-office visit: 2 ? ?Interim History: Rachel Torres continues to work on diet, exercise, and weight loss. She is doing well with making healthier choices. She is planning on increasing her activity and she is hoping to start with a personal trainer.  ? ?Subjective:  ? ?1. Pre-diabetes ?Rachel Torres is ready to discontinue Ozempic. She is doing well with diet and exercise. She is trying to discontinue her medications when possible. ? ?Assessment/Plan:  ? ?1. Pre-diabetes ?Rachel Torres agreed to discontinue Ozempic and we will continue to follow. She will continue her diet and exercise. ? ?2. Obesity with current BMI 26.7 ?Rachel Torres is currently in the action stage of change. As such, her goal is to continue with weight loss efforts. She has agreed to practicing portion control and making smarter food choices, such as increasing vegetables and decreasing simple carbohydrates.  ? ?Exercise goals: For substantial health benefits, adults should do at least 150 minutes (2 hours and 30 minutes) a week of moderate-intensity, or 75 minutes (1 hour and 15 minutes) a week of vigorous-intensity aerobic physical activity, or an equivalent combination of moderate- and vigorous-intensity aerobic activity. Aerobic activity should be performed in episodes of at least 10 minutes, and preferably, it should be spread throughout the week. ? ?Behavioral modification strategies: increasing lean  protein intake and decreasing simple carbohydrates. ? ?Rachel Torres has agreed to follow-up with our clinic in 4 weeks. She was informed of the importance of frequent follow-up visits to maximize her success with intensive lifestyle modifications for her multiple health conditions.  ? ?Objective:  ? ?Blood pressure 107/67, pulse 75, temperature 98 ?F (36.7 ?C), height '5\' 5"'$  (1.651 m), weight 160 lb (72.6 kg), SpO2 98 %. ?Body mass index is 26.63 kg/m?. ? ?General: Cooperative, alert, well developed, in no acute distress. ?HEENT: Conjunctivae and lids unremarkable. ?Cardiovascular: Regular rhythm.  ?Lungs: Normal work of breathing. ?Neurologic: No focal deficits.  ? ?Lab Results  ?Component Value Date  ? CREATININE 0.77 12/03/2021  ? BUN 15 12/03/2021  ? NA 137 12/03/2021  ? K 4.3 12/03/2021  ? CL 100 12/03/2021  ? CO2 22 12/03/2021  ? ?Lab Results  ?Component Value Date  ? ALT 16 12/03/2021  ? AST 18 12/03/2021  ? ALKPHOS 68 12/03/2021  ? BILITOT 0.5 12/03/2021  ? ?Lab Results  ?Component Value Date  ? HGBA1C 5.5 12/03/2021  ? HGBA1C 5.6 07/10/2021  ? HGBA1C 5.5 02/26/2021  ? HGBA1C 5.7 (H) 10/29/2020  ? HGBA1C 5.6 07/23/2020  ? ?Lab Results  ?Component Value Date  ? INSULIN 4.5 12/03/2021  ? INSULIN 17.3 02/26/2021  ? INSULIN 13.9 10/29/2020  ? INSULIN 22.5 07/23/2020  ? INSULIN 25.2 (H) 05/05/2020  ? ?Lab Results  ?Component Value Date  ? TSH 1.54 07/10/2021  ? ?Lab Results  ?Component Value Date  ? CHOL 195 12/03/2021  ? HDL 67 12/03/2021  ? LDLCALC 112 (H) 12/03/2021  ? LDLDIRECT 189.4 10/13/2011  ? TRIG 91 12/03/2021  ?  CHOLHDL 3 07/10/2021  ? ?Lab Results  ?Component Value Date  ? VD25OH 57.7 12/03/2021  ? VD25OH 74.87 07/10/2021  ? VD25OH 62.2 02/26/2021  ? ?Lab Results  ?Component Value Date  ? WBC 5.6 07/10/2021  ? HGB 13.9 07/10/2021  ? HCT 41.3 07/10/2021  ? MCV 96.2 07/10/2021  ? PLT 274.0 07/10/2021  ? ?Lab Results  ?Component Value Date  ? FERRITIN 105.0 12/11/2010  ? ?Attestation Statements:  ? ?Reviewed  by clinician on day of visit: allergies, medications, problem list, medical history, surgical history, family history, social history, and previous encounter notes. ? ?Time spent on visit including pre-visit chart review and post-visit care and charting was 30 minutes.  ? ? ?I, Trixie Dredge, am acting as transcriptionist for [name], MD. ? ?I have reviewed the above documentation for accuracy and completeness, and I agree with the above. -  [name], MD ? ? ?

## 2022-03-17 NOTE — Patient Instructions (Addendum)
Labs today  ? ?Keep working on healthy balanced diet  ? ?Think about exercise  ?In home is a good option   ? ?Meds sent in  ?

## 2022-03-22 ENCOUNTER — Ambulatory Visit (INDEPENDENT_AMBULATORY_CARE_PROVIDER_SITE_OTHER): Payer: Medicare HMO | Admitting: Psychology

## 2022-03-22 DIAGNOSIS — F331 Major depressive disorder, recurrent, moderate: Secondary | ICD-10-CM
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
## 2022-03-29 ENCOUNTER — Ambulatory Visit (INDEPENDENT_AMBULATORY_CARE_PROVIDER_SITE_OTHER): Payer: Medicare HMO | Admitting: Psychology

## 2022-03-29 DIAGNOSIS — F331 Major depressive disorder, recurrent, moderate: Secondary | ICD-10-CM | POA: Diagnosis not present

## 2022-03-29 NOTE — Progress Notes (Signed)
[facility] Counselor/Therapist Progress Note ? ?Patient ID: BATUL DIEGO, MRN: [id number],   ? ?Date: 03/29/2022 ? ?Time Spent: 10:00am-10:55am   55 minutes  ? ?Treatment Type: Individual Therapy ? ?Reported Symptoms: stress, frustration ? ?Mental Status Exam: ?Appearance:  Casual     ?Behavior: Appropriate  ?Motor: Normal  ?Speech/Language:  Normal Rate  ?Affect: Appropriate  ?Mood: normal  ?Thought process: normal  ?Thought content:   WNL  ?Sensory/Perceptual disturbances:   WNL  ?Orientation: oriented to person, place, time/date, and situation  ?Attention: Good  ?Concentration: Good  ?Memory: WNL  ?Fund of knowledge:  Good  ?Insight:   Good  ?Judgment:  Good  ?Impulse Control: Good  ? ?Risk Assessment: ?Danger to Self:  No ?Self-injurious Behavior: No ?Danger to Others: No ?Duty to Warn:no ?Physical Aggression / Violence:No  ?Access to Firearms a concern: No  ?Gang Involvement:No  ? ?Subjective:  ?[name] present for face-to-face individual therapy via video Webex.  Pt consents to telehealth video session due to COVID 19 pandemic. ?Location of pt: home ?Location of therapist: home office.   ?Pt talked about spending the weekend with [name] daughter and the daughter's son.  The visit was challenging bc of the 34 yo son's behavior.  [name] daughter is in a substance abuse rehab program.   ?Pt talked about getting angry at [name] last week bc he wouldn't hold onto his walker correctly.  [name] resisted and did not follow pt's recommendations and then when pt got made [name] shut down.  Addressed how frustrating this is for pt.   ?[name] is going to have some extensive cognitive testing this summer.  Pt thinks [name] has had significant cognitive decline.   ?Worked with pt on how she can take care of herself when she feels frustrated.   Identified that it could help for her to have some space and go to her craft room.   Worked on calming strategies and taught pt diaphramatic breathing exercises.  Rocking  in her rocking chair is also soothing for pt.  ?Pt talked about the death of their beloved cat 4 years ago.  They were very attached to that [place].   They now have 2 other cats but they are not as lovable as their previous cat.   ?Provided supportive therapy. ? ?Interventions: Cognitive Behavioral Therapy and Insight-Oriented ? ?Diagnosis: F33.1 ? ? ?Plan of Care: Recommend ongoing therapy.   Pt participated in setting treatment goals.   Pt needs a place to talk about stressors.  Plan to meet every two weeks.   ? ?Treatment Plan (Treatment Plan Target Date: 03/23/2023) ?Client Abilities/Strengths  ?Pt is bright, engaging, and motivated for therapy.   ?Client Treatment Preferences  ?Individual therapy.  ?Client Statement of Needs  ?Improve coping skills.  Have a place to talk about stressors.   ?Symptoms  ?Depressed or irritable mood. ?Unresolved grief issues. ? ?Problems Addressed  ?Unipolar Depression ?Goals ?1. Alleviate depressive symptoms and return to previous level of effective functioning. ?2. Appropriately grieve the loss in order to normalize mood and to return to previously adaptive level of functioning. ?Objective ?Learn and implement behavioral strategies to overcome depression. ?Target Date: 2023-03-23 Frequency: Biweekly  ?Progress: 10 Modality: individual  ?Related Interventions ?Engage the client in "behavioral activation," increasing his/her activity level and contact with sources of reward, while identifying processes that inhibit activation.  Use behavioral techniques such as instruction, rehearsal, role-playing, role reversal, as needed, to facilitate activity in the client's daily life; reinforce success. ?Assist  the client in developing skills that increase the likelihood of deriving pleasure from behavioral activation (e.g., assertiveness skills, developing an exercise plan, less internal/more external focus, increased social involvement); reinforce success. ?Objective ?Identify important people  in life, past and present, and describe the quality, good and poor, of those relationships. ?Target Date: 2023-03-23 Frequency: Biweekly  ?Progress: 10 Modality: individual  ?Related Interventions ?Conduct Interpersonal Therapy beginning with the assessment of the client's "interpersonal inventory" of important past and present relationships; develop a case formulation linking depression to grief, interpersonal role disputes, role transitions, and/or interpersonal deficits). ?Objective ?Learn and implement problem-solving and decision-making skills. ?Target Date: 2023-03-23 Frequency: Biweekly  ?Progress: 10 Modality: individual  ?Related Interventions ?Conduct Problem-Solving Therapy using techniques such as psychoeducation, modeling, and role-playing to teach client problem-solving skills (i.e., defining a problem specifically, generating possible solutions, evaluating the pros and cons of each solution, selecting and implementing a plan of action, evaluating the efficacy of the plan, accepting or revising the plan); role-play application of the problem-solving skill to a real life issue. ?Encourage in the client the development of a positive problem orientation in which problems and solving them are viewed as a natural part of life and not something to be feared, despaired, or avoided. ?3. Develop healthy interpersonal relationships that lead to the alleviation and help prevent the relapse of depression. ?4. Develop healthy thinking patterns and beliefs about self, others, and the world that lead to the alleviation and help prevent the relapse of depression. ?5. Recognize, accept, and cope with feelings of depression. ?Diagnosis ?F33.1  ?Conditions For Discharge ?Achievement of treatment goals and objectives  ? ?Damere Brandenburg, LCSW ? ? ? ?

## 2022-04-01 ENCOUNTER — Ambulatory Visit (INDEPENDENT_AMBULATORY_CARE_PROVIDER_SITE_OTHER): Payer: Medicare HMO | Admitting: Family Medicine

## 2022-04-13 ENCOUNTER — Ambulatory Visit (INDEPENDENT_AMBULATORY_CARE_PROVIDER_SITE_OTHER): Payer: Medicare HMO | Admitting: Family Medicine

## 2022-04-15 ENCOUNTER — Encounter (INDEPENDENT_AMBULATORY_CARE_PROVIDER_SITE_OTHER): Payer: Self-pay | Admitting: Family Medicine

## 2022-04-15 ENCOUNTER — Ambulatory Visit (INDEPENDENT_AMBULATORY_CARE_PROVIDER_SITE_OTHER): Payer: Medicare HMO | Admitting: Family Medicine

## 2022-04-15 VITALS — BP 113/58 | HR 70 | Temp 97.8°F | Ht 65.0 in | Wt 163.0 lb

## 2022-04-15 DIAGNOSIS — Z6827 Body mass index (BMI) 27.0-27.9, adult: Secondary | ICD-10-CM

## 2022-04-15 DIAGNOSIS — F439 Reaction to severe stress, unspecified: Secondary | ICD-10-CM | POA: Diagnosis not present

## 2022-04-15 DIAGNOSIS — E669 Obesity, unspecified: Secondary | ICD-10-CM | POA: Diagnosis not present

## 2022-04-23 ENCOUNTER — Ambulatory Visit (INDEPENDENT_AMBULATORY_CARE_PROVIDER_SITE_OTHER): Payer: Medicare HMO | Admitting: Psychology

## 2022-04-23 DIAGNOSIS — F331 Major depressive disorder, recurrent, moderate: Secondary | ICD-10-CM

## 2022-04-23 NOTE — Progress Notes (Signed)
[facility] Counselor/Therapist Progress Note ? ?Patient ID: SHADEE RATHOD, MRN: [id number],   ? ?Date: 04/23/2022 ? ?Time Spent: 12:00pm-12:55pm   55 minutes  ? ?Treatment Type: Individual Therapy ? ?Reported Symptoms: stress, frustration ? ?Mental Status Exam: ?Appearance:  Casual     ?Behavior: Appropriate  ?Motor: Normal  ?Speech/Language:  Normal Rate  ?Affect: Appropriate  ?Mood: normal  ?Thought process: normal  ?Thought content:   WNL  ?Sensory/Perceptual disturbances:   WNL  ?Orientation: oriented to person, place, time/date, and situation  ?Attention: Good  ?Concentration: Good  ?Memory: WNL  ?Fund of knowledge:  Good  ?Insight:   Good  ?Judgment:  Good  ?Impulse Control: Good  ? ?Risk Assessment: ?Danger to Self:  No ?Self-injurious Behavior: No ?Danger to Others: No ?Duty to Warn:no ?Physical Aggression / Violence:No  ?Access to Firearms a concern: No  ?Gang Involvement:No  ? ?Subjective:  ?[name] present for face-to-face individual therapy via video Webex.  Pt consents to telehealth video session due to COVID 19 pandemic. ?Location of pt: home ?Location of therapist: home office.   ?Pt talked about her anxiety.  She feels anxious when she has to deal with things regarding caregiving for [name] and her mother.  Addressed pt's anxiety and what she experiences.  She gets "high strung" and worries about things.  Then she is less patient with [name].   Worked with pt on claming strategies.   ?Pt talked about her 65 yo mother who is declining cognitively.    ?Pt thinks her husband [name] is starting to get Lewis Body Dementia.  Addressed pt's concerns about [name].   ?Pt talked about feeling frustrated about her diet.  She has been emotionally eating.   She wants to start exercising but is not sure what to start with.  Addressed pt's health goals and how she can set small attainable goals.    ?Worked with pt on how she can take care of herself when she feels frustrated.    ?Pt talked about an issue  with her great niece who has been borrowing money from pt frequently.  Pt had to set a limit which was hard for her.  Helped pt process her feelings and relationship dynamics.   Worked on healthy boundary setting.   ?Provided supportive therapy. ? ?Interventions: Cognitive Behavioral Therapy and Insight-Oriented ? ?Diagnosis: F33.1 ? ? ?Plan of Care: Recommend ongoing therapy.   Pt participated in setting treatment goals.   Pt needs a place to talk about stressors.  Plan to meet every two weeks.   ? ?Treatment Plan (Treatment Plan Target Date: 03/23/2023) ?Client Abilities/Strengths  ?Pt is bright, engaging, and motivated for therapy.   ?Client Treatment Preferences  ?Individual therapy.  ?Client Statement of Needs  ?Improve coping skills.  Have a place to talk about stressors.   ?Symptoms  ?Depressed or irritable mood. ?Unresolved grief issues. ? ?Problems Addressed  ?Unipolar Depression ?Goals ?1. Alleviate depressive symptoms and return to previous level of effective functioning. ?2. Appropriately grieve the loss in order to normalize mood and to return to previously adaptive level of functioning. ?Objective ?Learn and implement behavioral strategies to overcome depression. ?Target Date: 2023-03-23 Frequency: Biweekly  ?Progress: 10 Modality: individual  ?Related Interventions ?Engage the client in "behavioral activation," increasing his/her activity level and contact with sources of reward, while identifying processes that inhibit activation.  Use behavioral techniques such as instruction, rehearsal, role-playing, role reversal, as needed, to facilitate activity in the client's daily life; reinforce success. ?Assist  the client in developing skills that increase the likelihood of deriving pleasure from behavioral activation (e.g., assertiveness skills, developing an exercise plan, less internal/more external focus, increased social involvement); reinforce success. ?Objective ?Identify important people in life, past  and present, and describe the quality, good and poor, of those relationships. ?Target Date: 2023-03-23 Frequency: Biweekly  ?Progress: 10 Modality: individual  ?Related Interventions ?Conduct Interpersonal Therapy beginning with the assessment of the client's "interpersonal inventory" of important past and present relationships; develop a case formulation linking depression to grief, interpersonal role disputes, role transitions, and/or interpersonal deficits). ?Objective ?Learn and implement problem-solving and decision-making skills. ?Target Date: 2023-03-23 Frequency: Biweekly  ?Progress: 10 Modality: individual  ?Related Interventions ?Conduct Problem-Solving Therapy using techniques such as psychoeducation, modeling, and role-playing to teach client problem-solving skills (i.e., defining a problem specifically, generating possible solutions, evaluating the pros and cons of each solution, selecting and implementing a plan of action, evaluating the efficacy of the plan, accepting or revising the plan); role-play application of the problem-solving skill to a real life issue. ?Encourage in the client the development of a positive problem orientation in which problems and solving them are viewed as a natural part of life and not something to be feared, despaired, or avoided. ?3. Develop healthy interpersonal relationships that lead to the alleviation and help prevent the relapse of depression. ?4. Develop healthy thinking patterns and beliefs about self, others, and the world that lead to the alleviation and help prevent the relapse of depression. ?5. Recognize, accept, and cope with feelings of depression. ?Diagnosis ?F33.1  ?Conditions For Discharge ?Achievement of treatment goals and objectives  ? ?Rollande Thursby, LCSW ? ? ?

## 2022-04-29 NOTE — Progress Notes (Signed)
Chief Complaint:   OBESITY Rachel Torres is here to discuss her progress with her obesity treatment plan along with follow-up of her obesity related diagnoses. Rachel Torres is on practicing portion control and making smarter food choices, such as increasing vegetables and decreasing simple carbohydrates and states she is following her eating plan approximately 80% of the time. Rachel Torres states she is doing 0 minutes 0 times per week.  Today's visit was #: 25 Starting weight: 213 lbs Starting date: 05/05/2020 Today's weight: 163 lbs Today's date: 04/15/2022 Total lbs lost to date: 50 Total lbs lost since last in-office visit: 0  Interim History: Rachel Torres has struggled with weight loss lately. She hasn't been able to concentrate on her meal planning. She is mostly working on portion control and Psychologist, counselling.   Subjective:   1. Stress Rachel Torres is dealing with stress with her husband and her disability. She is frustrated with the very slow process. She is working on having time for herself.   Assessment/Plan:   1. Stress Rachel Torres was offered support and encouragement. She will continue to follow her plan.   2. Obesity with current BMI 27.1 Rachel Torres is currently in the action stage of change. As such, her goal is to continue with weight loss efforts. She has agreed to practicing portion control and making smarter food choices, such as increasing vegetables and decreasing simple carbohydrates.   Exercise goals: Chair yoga.  Behavioral modification strategies: increasing lean protein intake and no skipping meals.  Rachel Torres has agreed to follow-up with our clinic in 4 weeks. She was informed of the importance of frequent follow-up visits to maximize her success with intensive lifestyle modifications for her multiple health conditions.   Objective:   Blood pressure (!) 113/58, pulse 70, temperature 97.8 F (36.6 C), height '5\' 5"'$  (1.651 m), weight 163 lb (73.9 kg), SpO2 98 %. Body mass  index is 27.12 kg/m.  General: Cooperative, alert, well developed, in no acute distress. HEENT: Conjunctivae and lids unremarkable. Cardiovascular: Regular rhythm.  Lungs: Normal work of breathing. Neurologic: No focal deficits.   Lab Results  Component Value Date   CREATININE 0.82 03/17/2022   BUN 12 03/17/2022   NA 136 03/17/2022   K 4.7 03/17/2022   CL 102 03/17/2022   CO2 27 03/17/2022   Lab Results  Component Value Date   ALT 16 12/03/2021   AST 18 12/03/2021   ALKPHOS 68 12/03/2021   BILITOT 0.5 12/03/2021   Lab Results  Component Value Date   HGBA1C 5.5 03/17/2022   HGBA1C 5.5 12/03/2021   HGBA1C 5.6 07/10/2021   HGBA1C 5.5 02/26/2021   HGBA1C 5.7 (H) 10/29/2020   Lab Results  Component Value Date   INSULIN 4.5 12/03/2021   INSULIN 17.3 02/26/2021   INSULIN 13.9 10/29/2020   INSULIN 22.5 07/23/2020   INSULIN 25.2 (H) 05/05/2020   Lab Results  Component Value Date   TSH 1.54 07/10/2021   Lab Results  Component Value Date   CHOL 182 03/17/2022   HDL 63.30 03/17/2022   LDLCALC 105 (H) 03/17/2022   LDLDIRECT 189.4 10/13/2011   TRIG 70.0 03/17/2022   CHOLHDL 3 03/17/2022   Lab Results  Component Value Date   VD25OH 57.7 12/03/2021   VD25OH 74.87 07/10/2021   VD25OH 62.2 02/26/2021   Lab Results  Component Value Date   WBC 5.6 07/10/2021   HGB 13.9 07/10/2021   HCT 41.3 07/10/2021   MCV 96.2 07/10/2021   PLT 274.0 07/10/2021   Lab  Results  Component Value Date   FERRITIN 105.0 12/11/2010   Attestation Statements:   Reviewed by clinician on day of visit: allergies, medications, problem list, medical history, surgical history, family history, social history, and previous encounter notes.   I, Trixie Dredge, am acting as transcriptionist for [name], MD.  I have reviewed the above documentation for accuracy and completeness, and I agree with the above. -  [name], MD

## 2022-05-07 ENCOUNTER — Encounter: Payer: Self-pay | Admitting: Family Medicine

## 2022-05-07 DIAGNOSIS — R7303 Prediabetes: Secondary | ICD-10-CM

## 2022-05-07 DIAGNOSIS — Z8679 Personal history of other diseases of the circulatory system: Secondary | ICD-10-CM

## 2022-05-07 DIAGNOSIS — I5042 Chronic combined systolic (congestive) and diastolic (congestive) heart failure: Secondary | ICD-10-CM

## 2022-05-07 DIAGNOSIS — I10 Essential (primary) hypertension: Secondary | ICD-10-CM

## 2022-05-07 DIAGNOSIS — F418 Other specified anxiety disorders: Secondary | ICD-10-CM

## 2022-05-11 ENCOUNTER — Ambulatory Visit (INDEPENDENT_AMBULATORY_CARE_PROVIDER_SITE_OTHER): Payer: Medicare HMO | Admitting: Psychology

## 2022-05-11 DIAGNOSIS — F331 Major depressive disorder, recurrent, moderate: Secondary | ICD-10-CM

## 2022-05-11 NOTE — Progress Notes (Signed)
[facility] Counselor/Therapist Progress Note  Patient ID: LYBERTI THRUSH, MRN: [id number],    Date: 05/11/2022  Time Spent: 9:00am-9:55am    55 minutes   Treatment Type: Individual Therapy  Reported Symptoms: stress  Mental Status Exam: Appearance:  Casual     Behavior: Appropriate  Motor: Normal  Speech/Language:  Normal Rate  Affect: Appropriate  Mood: normal  Thought process: normal  Thought content:   WNL  Sensory/Perceptual disturbances:   WNL  Orientation: oriented to person, place, time/date, and situation  Attention: Good  Concentration: Good  Memory: WNL  Fund of knowledge:  Good  Insight:   Good  Judgment:  Good  Impulse Control: Good   Risk Assessment: Danger to Self:  No Self-injurious Behavior: No Danger to Others: No Duty to Warn:no Physical Aggression / Violence:No  Access to Firearms a concern: No  Gang Involvement:No   Subjective:  [name] present for face-to-face individual therapy via video Webex.  Pt consents to telehealth video session due to COVID 19 pandemic. Location of pt: home Location of therapist: home office.   Pt talked about having some good respite and self care.   [name] went on a trip with some men from church for a few days which gave pt a nice break from caregiving.    Pt attended a caregiving group at [employer].   She treated herself to some shopping and did some crafts.   Pt talked about her husband [name].  He has fallen a few times the past week.   Pt is concerned about [name] memory issues.  Addressed pt's concerns.   Pt talked to her PCP about getting palleative care for herself.   She wants to get some of the burdens off of her. Pt talked about having a disappointing mother's day.  One of her son's forgot about mother's day.   Helped pt process her feelings.   Worked on self care strategies.   Provided supportive therapy.  Interventions: Cognitive Behavioral Therapy and Insight-Oriented  Diagnosis:  F33.1   Plan of Care: Recommend ongoing therapy.   Pt participated in setting treatment goals.   Pt needs a place to talk about stressors.  Plan to meet every two weeks.    Treatment Plan (Treatment Plan Target Date: 03/23/2023) Client Abilities/Strengths  Pt is bright, engaging, and motivated for therapy.   Client Treatment Preferences  Individual therapy.  Client Statement of Needs  Improve coping skills.  Have a place to talk about stressors.   Symptoms  Depressed or irritable mood. Unresolved grief issues.  Problems Addressed  Unipolar Depression Goals 1. Alleviate depressive symptoms and return to previous level of effective functioning. 2. Appropriately grieve the loss in order to normalize mood and to return to previously adaptive level of functioning. Objective Learn and implement behavioral strategies to overcome depression. Target Date: 2023-03-23 Frequency: Biweekly  Progress: 10 Modality: individual  Related Interventions Engage the client in "behavioral activation," increasing his/her activity level and contact with sources of reward, while identifying processes that inhibit activation.  Use behavioral techniques such as instruction, rehearsal, role-playing, role reversal, as needed, to facilitate activity in the client's daily life; reinforce success. Assist the client in developing skills that increase the likelihood of deriving pleasure from behavioral activation (e.g., assertiveness skills, developing an exercise plan, less internal/more external focus, increased social involvement); reinforce success. Objective Identify important people in life, past and present, and describe the quality, good and poor, of those relationships. Target Date: 2023-03-23 Frequency: Biweekly  Progress: 10 Modality: individual  Related Interventions Conduct Interpersonal Therapy beginning with the assessment of the client's "interpersonal inventory" of important past and present  relationships; develop a case formulation linking depression to grief, interpersonal role disputes, role transitions, and/or interpersonal deficits). Objective Learn and implement problem-solving and decision-making skills. Target Date: 2023-03-23 Frequency: Biweekly  Progress: 10 Modality: individual  Related Interventions Conduct Problem-Solving Therapy using techniques such as psychoeducation, modeling, and role-playing to teach client problem-solving skills (i.e., defining a problem specifically, generating possible solutions, evaluating the pros and cons of each solution, selecting and implementing a plan of action, evaluating the efficacy of the plan, accepting or revising the plan); role-play application of the problem-solving skill to a real life issue. Encourage in the client the development of a positive problem orientation in which problems and solving them are viewed as a natural part of life and not something to be feared, despaired, or avoided. 3. Develop healthy interpersonal relationships that lead to the alleviation and help prevent the relapse of depression. 4. Develop healthy thinking patterns and beliefs about self, others, and the world that lead to the alleviation and help prevent the relapse of depression. 5. Recognize, accept, and cope with feelings of depression. Diagnosis F33.1  Conditions For Discharge Achievement of treatment goals and objectives   [name], LCSW

## 2022-05-12 ENCOUNTER — Encounter (INDEPENDENT_AMBULATORY_CARE_PROVIDER_SITE_OTHER): Payer: Self-pay | Admitting: Family Medicine

## 2022-05-12 ENCOUNTER — Ambulatory Visit (INDEPENDENT_AMBULATORY_CARE_PROVIDER_SITE_OTHER): Payer: Medicare HMO | Admitting: Family Medicine

## 2022-05-12 VITALS — BP 119/49 | HR 73 | Temp 97.9°F | Ht 65.0 in | Wt 170.0 lb

## 2022-05-12 DIAGNOSIS — Z6828 Body mass index (BMI) 28.0-28.9, adult: Secondary | ICD-10-CM

## 2022-05-12 DIAGNOSIS — E669 Obesity, unspecified: Secondary | ICD-10-CM | POA: Diagnosis not present

## 2022-05-12 DIAGNOSIS — K5909 Other constipation: Secondary | ICD-10-CM

## 2022-05-23 ENCOUNTER — Other Ambulatory Visit: Payer: Self-pay | Admitting: Family Medicine

## 2022-05-24 NOTE — Telephone Encounter (Signed)
Last filled on 03/12/22 Last OV 03/17/22

## 2022-05-25 NOTE — Progress Notes (Signed)
Chief Complaint:   OBESITY Rachel Torres is here to discuss her progress with her obesity treatment plan along with follow-up of her obesity related diagnoses. Rachel Torres is on practicing portion control and making smarter food choices, such as increasing vegetables and decreasing simple carbohydrates and states she is following her eating plan approximately 40% of the time. Rachel Torres states she is doing 0 minutes 0 times per week.  Today's visit was #: 61 Starting weight: 213 lbs Starting date: 05/05/2020 Today's weight: 170 lbs Today's date: 05/12/2022 Total lbs lost to date: 43 Total lbs lost since last in-office visit: 0  Interim History: Rachel Torres notes some increased cravings and temptations. She notes increased stress with family issues and a lot of doctors appointments. She is going to see a new Physiological scientist, who she expects to give her nutritional advice.   Subjective:   1. Other constipation Rachel Torres is on Colace and she is on miralax as needed.   Assessment/Plan:   1. Other constipation Rachel Torres is to increase her miralax to daily, and increase her water and fiber intake in her diet.  2. Obesity, Current BMI 28.4 Rachel Torres is currently in the action stage of change. As such, her goal is to continue with weight loss efforts. She has agreed to practicing portion control and making smarter food choices, such as increasing vegetables and decreasing simple carbohydrates.   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  Behavioral modification strategies: increasing lean protein intake and emotional eating strategies.  Rachel Torres has agreed to follow-up with our clinic in 4 weeks. She was informed of the importance of frequent follow-up visits to maximize her success with intensive lifestyle modifications for her multiple health conditions.   Objective:   Blood pressure (!) 119/49, pulse 73, temperature 97.9 F (36.6 C), height '5\' 5"'$  (1.651 m), weight  170 lb (77.1 kg), SpO2 97 %. Body mass index is 28.29 kg/m.  General: Cooperative, alert, well developed, in no acute distress. HEENT: Conjunctivae and lids unremarkable. Cardiovascular: Regular rhythm.  Lungs: Normal work of breathing. Neurologic: No focal deficits.   Lab Results  Component Value Date   CREATININE 0.82 03/17/2022   BUN 12 03/17/2022   NA 136 03/17/2022   K 4.7 03/17/2022   CL 102 03/17/2022   CO2 27 03/17/2022   Lab Results  Component Value Date   ALT 16 12/03/2021   AST 18 12/03/2021   ALKPHOS 68 12/03/2021   BILITOT 0.5 12/03/2021   Lab Results  Component Value Date   HGBA1C 5.5 03/17/2022   HGBA1C 5.5 12/03/2021   HGBA1C 5.6 07/10/2021   HGBA1C 5.5 02/26/2021   HGBA1C 5.7 (H) 10/29/2020   Lab Results  Component Value Date   INSULIN 4.5 12/03/2021   INSULIN 17.3 02/26/2021   INSULIN 13.9 10/29/2020   INSULIN 22.5 07/23/2020   INSULIN 25.2 (H) 05/05/2020   Lab Results  Component Value Date   TSH 1.54 07/10/2021   Lab Results  Component Value Date   CHOL 182 03/17/2022   HDL 63.30 03/17/2022   LDLCALC 105 (H) 03/17/2022   LDLDIRECT 189.4 10/13/2011   TRIG 70.0 03/17/2022   CHOLHDL 3 03/17/2022   Lab Results  Component Value Date   VD25OH 57.7 12/03/2021   VD25OH 74.87 07/10/2021   VD25OH 62.2 02/26/2021   Lab Results  Component Value Date   WBC 5.6 07/10/2021   HGB 13.9 07/10/2021   HCT 41.3 07/10/2021   MCV 96.2 07/10/2021  PLT 274.0 07/10/2021   Lab Results  Component Value Date   FERRITIN 105.0 12/11/2010   Attestation Statements:   Reviewed by clinician on day of visit: allergies, medications, problem list, medical history, surgical history, family history, social history, and previous encounter notes.  Time spent on visit including pre-visit chart review and post-visit care and charting was 32 minutes.   I, Trixie Dredge, am acting as transcriptionist for [name], MD.  I have reviewed the above  documentation for accuracy and completeness, and I agree with the above. -  [name], MD

## 2022-05-26 ENCOUNTER — Other Ambulatory Visit: Payer: Self-pay

## 2022-05-26 ENCOUNTER — Telehealth: Payer: Self-pay | Admitting: Student

## 2022-05-26 MED ORDER — VENLAFAXINE HCL ER 150 MG PO CP24: ORAL_CAPSULE | ORAL | 3 refills | Status: DC

## 2022-05-26 NOTE — Telephone Encounter (Signed)
MEDICATION:venlafaxine XR (EFFEXOR-XR) 150 MG 24 hr capsule  PHARMACY:[facility] - [place], [place]  Comments: Patient is using mail order service, and they take at least 3-4 days for shipment. Asking if we can get this done today as she has 4 pills left.   **Let patient know to contact pharmacy at the end of the day to make sure medication is ready. **  ** Please notify patient to allow 48-72 hours to process**  **Encourage patient to contact the pharmacy for refills or they can request refills through [facility]**

## 2022-05-26 NOTE — Telephone Encounter (Signed)
Refill request for  Venlafaxine XR 150 mg  LR 11/23/21, #90, 1 rf LOV 03/17/22 FOV  none scheduled.  Please review and advise.  Thanks. Dm/cma

## 2022-05-26 NOTE — Telephone Encounter (Signed)
Rec'd message that patient called and wanted to schedule Palliative Consult.  Ret'd call to patient and she was at the [place] with her husband and requested I call her back around 3 PM today.

## 2022-05-27 ENCOUNTER — Telehealth: Payer: Self-pay | Admitting: Student

## 2022-05-27 NOTE — Telephone Encounter (Signed)
Rec'd call from patient and we discussed the Palliative referral/services and all questions were answered and patient was in agreement with beginning services with [place].  I have scheduled a MyChart Palliative Consult for 06/01/22 @ 2:30 PM

## 2022-05-28 ENCOUNTER — Telehealth: Payer: Self-pay

## 2022-05-28 NOTE — Progress Notes (Addendum)
Chronic Care Management Pharmacy Assistant   Name: Rachel Torres  MRN: [id number] DOB: October 29, 1956  Reason for Encounter: CCM (Appointment Reminder)  Medications: Outpatient Encounter Medications as of 05/28/2022  Medication Sig   acetaminophen (TYLENOL) 500 MG tablet Take 500 mg by mouth every 6 (six) hours as needed.   albuterol (VENTOLIN HFA) 108 (90 Base) MCG/ACT inhaler Inhale 1-2 puffs into the lungs every 6 (six) hours as needed for wheezing or shortness of breath.   ALPRAZolam (XANAX) 0.5 MG tablet Take 1 tablet (0.5 mg total) by mouth daily as needed for anxiety.   aspirin EC 81 MG tablet Take 81 mg by mouth at bedtime.   atorvastatin (LIPITOR) 20 MG tablet Take 1 tablet (20 mg total) by mouth daily. (Patient taking differently: Take 20 mg by mouth. FOUR TIMES A WEEK)   carvedilol (COREG) 6.25 MG tablet TAKE 1 TABLET TWICE DAILY WITH A MEAL   cetirizine (ZYRTEC) 10 MG tablet Take 1 tablet (10 mg total) by mouth daily.   Cholecalciferol (VITAMIN D3) 50 MCG (2000 UT) CAPS Take 2,000 capsules by mouth. FOUR TIMES A WEEK   clindamycin (CLEOCIN) 300 MG capsule 300 mg. AS NEEDED FOR DENTAL PROCEDURES   cyclobenzaprine (FLEXERIL) 10 MG tablet Take 0.5-1 tablets (5-10 mg total) by mouth 3 (three) times daily as needed. For headache   fluticasone (FLONASE) 50 MCG/ACT nasal spray Place 2 sprays into both nostrils daily.   furosemide (LASIX) 20 MG tablet Take 1 tablet (20 mg total) by mouth every other day. And as needed for swelling or shortness of breath (Patient taking differently: Take 20 mg by mouth. 4 times a week)   Insulin Pen Needle (BD PEN NEEDLE NANO U/F) 32G X 4 MM MISC Use Nano needle with Ozempic   Krill Oil (OMEGA-3) 500 MG CAPS Take 1 capsule by mouth daily.   lisinopril (ZESTRIL) 5 MG tablet TAKE 1/2 TABLET EVERY DAY.   magnesium gluconate (MAGONATE) 500 MG tablet Take 500 mg by mouth at bedtime.   Melatonin 5 MG SUBL Place under the tongue at bedtime as needed.   Misc  Natural Products (TART CHERRY ADVANCED) CAPS Take 1 capsule by mouth daily.   naproxen sodium (ANAPROX) 220 MG tablet Take 440 mg by mouth 2 (two) times daily as needed (for headaches/pain).   omeprazole (PRILOSEC) 40 MG capsule TAKE 1 CAPSULE EVERY DAY   polyethylene glycol powder (GLYCOLAX/MIRALAX) 17 GM/SCOOP powder Take 17 g by mouth as needed.   potassium chloride (KLOR-CON) 10 MEQ tablet Take 1 tablet (10 mEq total) by mouth every other day. Take with Lasix (Patient taking differently: Take 10 mEq by mouth. FOUR TIMES A WEEK (Take with Lasix))   promethazine (PHENERGAN) 25 MG tablet Take 0.5 tablets (12.5 mg total) by mouth every 8 (eight) hours as needed for nausea.   Simethicone (MYLANTA GAS PO) Take by mouth 3 times/day as needed-between meals & bedtime.   venlafaxine XR (EFFEXOR-XR) 150 MG 24 hr capsule TAKE 1 CAPSULE EVERY DAY WITH BREAKFAST   venlafaxine XR (EFFEXOR-XR) 37.5 MG 24 hr capsule TAKE 1 CAPSULE (37.5 MG TOTAL) BY MOUTH DAILY WITH BREAKFAST.   No facility-administered encounter medications on file as of 05/28/2022.   Rachel Torres was contacted to remind of upcoming telephone visit with Charlene Brooke on 06/02/2022 at 8:45. Patient was reminded to have any blood glucose and blood pressure readings available for review at appointment.   Message was left reminding patient of appointment.  CCM referral has  been placed prior to visit?  Yes   Star Rating Drugs: Medication:  Last Fill: Day Supply Atorvastatin 20 mg 03/22/2022 90 Lisinopril 5 mg  03/22/2022 [place], CPP notified  [name], [place] Pharmacy Assistant [phone]

## 2022-05-31 ENCOUNTER — Telehealth: Payer: Self-pay | Admitting: Cardiovascular Disease

## 2022-05-31 NOTE — Telephone Encounter (Signed)
I think she takes both effexor xr 150 and effexor xr 37.5 mg once daily  Takes them together  Was that their concern?

## 2022-05-31 NOTE — Telephone Encounter (Signed)
[place] and advised that patient is taking both strengths once daily. Nothing further is needed

## 2022-05-31 NOTE — Telephone Encounter (Signed)
Pt c/o medication issue:  1. Name of Medication: venlafaxine XR (EFFEXOR-XR) 37.5 MG 24 hr capsule  venlafaxine XR (EFFEXOR-XR) 150 MG 24 hr capsule  2. How are you currently taking this medication (dosage and times per day)?    3. Are you having a reaction (difficulty breathing--STAT)? no  4. What is your medication issue? Needing clarification on the medication for patient

## 2022-06-01 ENCOUNTER — Encounter: Payer: Self-pay | Admitting: Family Medicine

## 2022-06-01 ENCOUNTER — Telehealth: Payer: TRICARE For Life (TFL) | Admitting: Student

## 2022-06-01 DIAGNOSIS — R638 Other symptoms and signs concerning food and fluid intake: Secondary | ICD-10-CM

## 2022-06-01 DIAGNOSIS — I5042 Chronic combined systolic (congestive) and diastolic (congestive) heart failure: Secondary | ICD-10-CM

## 2022-06-01 DIAGNOSIS — M069 Rheumatoid arthritis, unspecified: Secondary | ICD-10-CM | POA: Diagnosis not present

## 2022-06-01 DIAGNOSIS — Z515 Encounter for palliative care: Secondary | ICD-10-CM

## 2022-06-01 DIAGNOSIS — F418 Other specified anxiety disorders: Secondary | ICD-10-CM

## 2022-06-01 DIAGNOSIS — I5040 Unspecified combined systolic (congestive) and diastolic (congestive) heart failure: Secondary | ICD-10-CM | POA: Diagnosis not present

## 2022-06-02 ENCOUNTER — Ambulatory Visit (INDEPENDENT_AMBULATORY_CARE_PROVIDER_SITE_OTHER): Payer: Medicare HMO | Admitting: Pharmacist

## 2022-06-02 DIAGNOSIS — I5042 Chronic combined systolic (congestive) and diastolic (congestive) heart failure: Secondary | ICD-10-CM

## 2022-06-02 DIAGNOSIS — M810 Age-related osteoporosis without current pathological fracture: Secondary | ICD-10-CM

## 2022-06-02 DIAGNOSIS — F418 Other specified anxiety disorders: Secondary | ICD-10-CM

## 2022-06-02 DIAGNOSIS — E7849 Other hyperlipidemia: Secondary | ICD-10-CM

## 2022-06-02 DIAGNOSIS — I10 Essential (primary) hypertension: Secondary | ICD-10-CM

## 2022-06-02 DIAGNOSIS — G43009 Migraine without aura, not intractable, without status migrainosus: Secondary | ICD-10-CM

## 2022-06-02 MED ORDER — VENLAFAXINE HCL ER 150 MG PO CP24: ORAL_CAPSULE | ORAL | 0 refills | Status: DC

## 2022-06-02 NOTE — Progress Notes (Signed)
Chronic Care Management Pharmacy Note  06/04/2022 Name:  Rachel Torres MRN:  [id number] DOB:  Jan 17, 1956  Summary: CCM F/U visit -Pt under increased stress lately, husband was in hospital last week and got home yesterday, she is main caregiver though dose have aid come in 16 hrs/wk from [place] -She ran out of venafaxine 150 mg last week and mail order is shipping it but not sure when it will arrive  Recommendations/Changes made from today's visit: -Ordered mail order bridge supply for venlafaxine 150 mg to local pharmacy ([facility])  Plan: -[place] will call patient 3 months for general update -Pharmacist follow up televisit scheduled for 6 months -PCP annual visit due July/Aug 2023    Subjective: Rachel Torres is an 66 y.o. year old female who is a primary patient of Tower, Wynelle Fanny, MD.  The CCM team was consulted for assistance with disease management and care coordination needs.    Engaged with patient by telephone for follow up visit in response to provider referral for pharmacy case management and/or care coordination services.   Consent to Services:  The patient was given information about Chronic Care Management services, agreed to services, and gave verbal consent prior to initiation of services.  Please see initial visit note for detailed documentation.   Patient Care Team: Tower, Wynelle Fanny, MD as PCP - General (Family Medicine) Starlyn Skeans, MD as Consulting Physician (Family Medicine) Charlton Haws, [facility] as Pharmacist (Pharmacist)  Recent office visits: 03/17/22 Dr Glori Bickers OV: f/u - rx'd cetirizine 10 mg. Refilled flexeril and phenergan for migraines.   12/08/21 TE - increase venlafaxine by 37.5 mg  Recent consult visits: 05/12/22 Dr Leafy Ro (Weight mgmt): starting wt 213 lbs, current wt 170 lbs (started 04/2020). C/o constipation, advised to increase Miralax.  05/11/22 Terri Bauert LCSW (Behavioral health): f/u MDD.  03/16/22 Dr Leafy Ro (Weight  mgmt): d/c ozempic  11/02/21 Dr [name] (Cardiology): f/u PAF. Refilled meds.  Hospital visits: None in previous 6 months   Objective:  Lab Results  Component Value Date   CREATININE 0.82 03/17/2022   BUN 12 03/17/2022   GFR 75.60 03/17/2022   EGFR 86 12/03/2021   GFRNONAA 68 07/23/2020   GFRAA 78 07/23/2020   NA 136 03/17/2022   K 4.7 03/17/2022   CALCIUM 10.1 03/17/2022   CO2 27 03/17/2022   GLUCOSE 89 03/17/2022    Lab Results  Component Value Date/Time   HGBA1C 5.5 03/17/2022 10:17 AM   HGBA1C 5.5 12/03/2021 07:55 AM   GFR 75.60 03/17/2022 10:17 AM   GFR 68.85 07/10/2021 08:10 AM    Last diabetic Eye exam: No results found for: "HMDIABEYEEXA"  Last diabetic Foot exam: No results found for: "HMDIABFOOTEX"   Lab Results  Component Value Date   CHOL 182 03/17/2022   HDL 63.30 03/17/2022   LDLCALC 105 (H) 03/17/2022   LDLDIRECT 189.4 10/13/2011   TRIG 70.0 03/17/2022   CHOLHDL 3 03/17/2022       Latest Ref Rng & Units 12/03/2021    7:55 AM 07/10/2021    8:10 AM 02/26/2021    1:55 PM  Hepatic Function  Total Protein 6.0 - 8.5 g/dL 7.0  7.1  7.6   Albumin 3.8 - 4.8 g/dL 4.6  4.4  4.6   AST 0 - 40 IU/L _0 ALT 0 - 32 IU/L _1 Alk Phosphatase 44 - 121 IU/L 68  62  75  Total Bilirubin 0.0 - 1.2 mg/dL 0.5  0.6  0.5     Lab Results  Component Value Date/Time   TSH 1.54 07/10/2021 08:10 AM   TSH 1.040 05/05/2020 11:53 AM   FREET4 0.94 05/05/2020 11:53 AM   FREET4 0.81 07/12/2016 02:36 PM       Latest Ref Rng & Units 07/10/2021    8:10 AM 10/21/2020    8:56 AM 02/19/2020    1:00 PM  CBC  WBC 4.0 - 10.5 K/uL 5.6  6.0  5.8   Hemoglobin 12.0 - 15.0 g/dL 13.9  13.6  13.0   Hematocrit 36.0 - 46.0 % 41.3  40.3  38.5   Platelets 150.0 - 400.0 K/uL 274.0  288.0  284.0     Lab Results  Component Value Date/Time   VD25OH 57.7 12/03/2021 07:55 AM   VD25OH 74.87 07/10/2021 08:10 AM   VD25OH 62.2 02/26/2021 01:55 PM   VD25OH 69.53 06/07/2019  10:38 AM    Clinical ASCVD: Yes  The 10-year ASCVD risk score (Arnett DK, et al., 2019) is: 9.8%   Values used to calculate the score:     Age: 53 years     Sex: Female     Is Non-Hispanic African American: No     Diabetic: Yes     Tobacco smoker: No     Systolic Blood Pressure: 914 mmHg     Is BP treated: Yes     HDL Cholesterol: 63.3 mg/dL     Total Cholesterol: 182 mg/dL       10/29/2021    9:03 AM 07/17/2021    2:21 PM 05/05/2020   10:03 AM  Depression screen PHQ 2/9  Decreased Interest 0 0 3  Down, Depressed, Hopeless 0 0 1  PHQ - 2 Score 0 0 4  Altered sleeping  2 2  Tired, decreased energy  2 3  Change in appetite  0 2  Feeling bad or failure about yourself   0 0  Trouble concentrating  0 0  Moving slowly or fidgety/restless  0 1  Suicidal thoughts  0 0  PHQ-9 Score  4 12  Difficult doing work/chores  Somewhat difficult Somewhat difficult     Social History   Tobacco Use  Smoking Status Former   Packs/day: 0.75   Years: 1.50   Total pack years: 1.13   Types: Cigarettes   Quit date: 12/20/1978   Years since quitting: 43.4  Smokeless Tobacco Never   BP Readings from Last 3 Encounters:  05/12/22 (!) 119/49  04/15/22 (!) 113/58  03/17/22 132/70   Pulse Readings from Last 3 Encounters:  05/12/22 73  04/15/22 70  03/17/22 78   Wt Readings from Last 3 Encounters:  05/12/22 170 lb (77.1 kg)  04/15/22 163 lb (73.9 kg)  03/17/22 164 lb 4 oz (74.5 kg)   BMI Readings from Last 3 Encounters:  05/12/22 28.29 kg/m  04/15/22 27.12 kg/m  03/17/22 27.33 kg/m    Assessment/Interventions: Review of patient past medical history, allergies, medications, health status, including review of consultants reports, laboratory and other test data, was performed as part of comprehensive evaluation and provision of chronic care management services.   SDOH:  (Social Determinants of Health) assessments and interventions performed: No - done Nov 2022  SDOH Screenings    Alcohol Screen: Low Risk  (10/29/2021)   Alcohol Screen    Last Alcohol Screening Score (AUDIT): 2  Depression (PHQ2-9): Low Risk  (10/29/2021)   Depression (PHQ2-9)  PHQ-2 Score: 0  Financial Resource Strain: Low Risk  (10/29/2021)   Overall Financial Resource Strain (CARDIA)    Difficulty of Paying Living Expenses: Not hard at all  Food Insecurity: No Food Insecurity (10/29/2021)   Hunger Vital Sign    Worried About Running Out of Food in the Last Year: Never true    [place] in the Last Year: Never true  Housing: Low Risk  (10/29/2021)   Housing    Last Housing Risk Score: 0  Physical Activity: Inactive (10/29/2021)   Exercise Vital Sign    Days of Exercise per Week: 0 days    Minutes of Exercise per Session: 0 min  Social Connections: Socially Integrated (10/29/2021)   Social Connection and Isolation Panel [NHANES]    Frequency of Communication with Friends and Family: More than three times a week    Frequency of Social Gatherings with Friends and Family: Twice a week    Attends Religious Services: More than 4 times per year    Active Member of [employer] or Organizations: Yes    Attends Archivist Meetings: More than 4 times per year    Marital Status: Married  Stress: No Stress Concern Present (10/29/2021)   [place] of Stress : Not at all  Tobacco Use: Medium Risk (05/12/2022)   Patient History    Smoking Tobacco Use: Former    Smokeless Tobacco Use: Never    Passive Exposure: Not on file  Transportation Needs: No Transportation Needs (10/29/2021)   PRAPARE - Hydrologist (Medical): No    Lack of Transportation (Non-Medical): No    CCM Care Plan  Allergies  Allergen Reactions   Fosamax [Alendronate Sodium]     GI upset    Ginzing [Ginseng] Other (See Comments)    Hyper and jitery    Hydrocod Poli-Chlorphe Poli Er Itching    Hydrocodone-Acetaminophen Itching   Imitrex [Sumatriptan]     Contraindicated w/ heart condition   Oxycodone Itching   Peanut-Containing Drug Products Other (See Comments)    Reaction:  Migraines    Tramadol Other (See Comments)    itching   Hydrocodone-Guaifenesin Itching   Prednisone Rash   Tape Itching    Medications Reviewed Today     Reviewed by Ronaldo Miyamoto, CMA (Certified Medical Assistant) on 05/12/22 at [place] List Status: <None>   Medication Order Taking? Sig Documenting Provider Last Dose Status Informant  acetaminophen (TYLENOL) 500 MG tablet [id number]  Take 500 mg by mouth every 6 (six) hours as needed. [provider]  Active   albuterol (VENTOLIN HFA) 108 (90 Base) MCG/ACT inhaler [id number]  Inhale 1-2 puffs into the lungs every 6 (six) hours as needed for wheezing or shortness of breath. Tower, Wynelle Fanny, MD  Active   ALPRAZolam Duanne Moron) 0.5 MG tablet [id number]  Take 1 tablet (0.5 mg total) by mouth daily as needed for anxiety. Tower, Wynelle Fanny, MD  Active   aspirin EC 81 MG tablet [id number]  Take 81 mg by mouth at bedtime. [provider]  Active Self  atorvastatin (LIPITOR) 20 MG tablet [id number]  Take 1 tablet (20 mg total) by mouth daily.  Patient taking differently: Take 20 mg by mouth. FOUR TIMES A WEEK   Gollan, Kathlene November, MD  Active   carvedilol (COREG) 6.25 MG tablet [id number]  TAKE 1 TABLET TWICE DAILY WITH A MEAL [name], Christia Reading  J, MD  Active   cetirizine (ZYRTEC) 10 MG tablet [id number]  Take 1 tablet (10 mg total) by mouth daily. Tower, Wynelle Fanny, MD  Active   Cholecalciferol (VITAMIN D3) 50 MCG (2000 UT) CAPS [id number]  Take 2,000 capsules by mouth. FOUR TIMES A WEEK [provider]  Active   clindamycin (CLEOCIN) 300 MG capsule [id number]  300 mg. AS NEEDED FOR DENTAL PROCEDURES [provider]  Active   cyclobenzaprine (FLEXERIL) 10 MG tablet [id number]  Take 0.5-1 tablets (5-10 mg total) by mouth 3 (three) times daily as  needed. For headache Tower, Wynelle Fanny, MD  Active   fluticasone [facility]) 50 MCG/ACT nasal spray [id number]  Place 2 sprays into both nostrils daily. Tower, Wynelle Fanny, MD  Active   furosemide (LASIX) 20 MG tablet [id number]  Take 1 tablet (20 mg total) by mouth every other day. And as needed for swelling or shortness of breath  Patient taking differently: Take 20 mg by mouth. 4 times a week   Gollan, Kathlene November, MD  Active   Insulin Pen Needle (BD PEN NEEDLE NANO U/F) 32G X 4 MM MISC [id number]  Use Nano needle with Waynette Buttery, Caren D, MD  Active   Krill Oil (OMEGA-3) 500 MG CAPS [id number]  Take 1 capsule by mouth daily. [provider]  Active   lisinopril (ZESTRIL) 5 MG tablet [id number]  TAKE 1/2 TABLET EVERY DAY. Minna Merritts, MD  Active   magnesium gluconate (MAGONATE) 500 MG tablet [id number]  Take 500 mg by mouth at bedtime. [provider]  Active Self  Melatonin 5 MG SUBL [id number]  Place under the tongue at bedtime as needed. [provider]  Active   Misc Natural Products (TART CHERRY ADVANCED) CAPS [id number]  Take 1 capsule by mouth daily. [provider]  Active   naproxen sodium (ANAPROX) 220 MG tablet [id number]  Take 440 mg by mouth 2 (two) times daily as needed (for headaches/pain). [provider]  Active Self           Med Note Minus Breeding Graylin Shiver   Tue Oct 28, 2020  3:09 PM)    omeprazole (PRILOSEC) 40 MG capsule [id number]  TAKE 1 CAPSULE EVERY DAY Tower, Roque Lias A, MD  Active   polyethylene glycol powder (GLYCOLAX/MIRALAX) 17 GM/SCOOP powder [id number]  Take 17 g by mouth as needed. [name] D, MD  Active   potassium chloride (KLOR-CON) 10 MEQ tablet [id number]  Take 1 tablet (10 mEq total) by mouth every other day. Take with Lasix  Patient taking differently: Take 10 mEq by mouth. FOUR TIMES A WEEK (Take with Lasix)   Gollan, Kathlene November, MD  Active   promethazine (PHENERGAN) 25 MG tablet [id number]  Take 0.5 tablets (12.5 mg  total) by mouth every 8 (eight) hours as needed for nausea. Tower, Wynelle Fanny, MD  Active   Simethicone (MYLANTA GAS PO) [id number]  Take by mouth 3 times/day as needed-between meals & bedtime. [provider]  Active   venlafaxine XR (EFFEXOR XR) 37.5 MG 24 hr capsule [id number]  Take 1 capsule (37.5 mg total) by mouth daily with breakfast. [name], MD  Active   venlafaxine XR (EFFEXOR-XR) 150 MG 24 hr capsule [id number]  TAKE 1 CAPSULE EVERY DAY WITH BREAKFAST Tower, Wynelle Fanny, MD  Active             Patient Active Problem List   Diagnosis Date Noted   Other constipation 05/12/2022   Medicare  annual wellness visit, initial 07/17/2021   TMJ (dislocation of temporomandibular joint) 07/17/2021   Encounter for screening mammogram for breast cancer 06/05/2021   Diarrhea 04/11/2020   Epigastric pain 02/19/2020   Dark stools 02/19/2020   PAF (paroxysmal atrial fibrillation) ([place]) 02/05/2019   Shortness of breath 01/27/2019   Irregular surface of cornea 12/25/2018   HPV (human papilloma virus) infection 12/01/2016   Screening mammogram, encounter for 11/29/2016   Estrogen deficiency 11/29/2016   Encounter for routine gynecological examination 11/29/2016   Grade III hemorrhoids 10/06/2016   Tachycardia 08/14/2016   Hemorrhoids 08/13/2016   Atypical migraine 08/03/2016   CAD (coronary artery disease)    AAA (abdominal aortic aneurysm) (HCC)    Chronic combined systolic (congestive) and diastolic (congestive) heart failure ([place])    Prediabetes 02/11/2016   Generalized anxiety disorder 12/08/2015   Panic attacks 07/28/2015   Sensorineural hearing loss 03/27/2013   Vitamin D deficiency 07/03/2012   H/O aortic dissection 05/12/2012   Routine general medical examination at a health care facility 09/20/2011   S/P AVR (aortic valve replacement) 01/07/2011   CORONARY ARTERY BYPASS GRAFT, HX OF 01/07/2011   Arthritis 12/20/2010   Back pain 12/15/2010   H/O dissecting abdominal  aortic aneurysm repair 08/24/2010   IRRITABLE BOWEL SYNDROME 09/09/2009   Obstructive sleep apnea 08/04/2009   External hemorrhoid 06/26/2009   Osteoporosis 01/09/2009   Hyperlipidemia 07/26/2007   Depression with anxiety 07/26/2007   Essential hypertension 07/26/2007   Allergic rhinitis 07/26/2007   Asthma 07/26/2007   GERD 07/26/2007   Osteoarthritis 07/26/2007   Ocular migraine 07/26/2007   Mild intermittent asthma without complication 38/46/6599    Immunization History  Administered Date(s) Administered   H1N1 01/09/2009   Influenza Split 09/20/2011, 09/11/2012   Influenza Whole 12/29/2006, 09/19/2008, 10/08/2010   Influenza, Seasonal, Injecte, Preservative Fre 10/08/2010   Influenza,inj,Quad PF,6+ Mos 10/08/2013, 08/13/2016, 10/27/2017, 10/05/2018, 09/17/2019   Influenza-Unspecified 09/26/2015   PFIZER Comirnaty(Gray Top)Covid-19 Tri-Sucrose Vaccine 05/15/2021   PFIZER(Purple Top)SARS-COV-2 Vaccination 03/12/2020, 04/02/2020, 10/10/2020   Pneumococcal Polysaccharide-23 12/29/2006   Td 01/09/2009, 04/03/2019    Conditions to be addressed/monitored:  Hypertension, Hyperlipidemia, Heart Failure, Coronary Artery Disease, Depression, Anxiety, Osteoporosis, and Osteoarthritis  Care Plan : [place]  Updates made by Charlton Haws, [place] since 06/04/2022 12:00 AM     Problem: Hypertension, Hyperlipidemia, Heart Failure, Coronary Artery Disease, Depression, Anxiety, Osteoporosis, and Osteoarthritis   Priority: High     Long-Range Goal: Disease Managament   Start Date: 06/04/2022  Expected End Date: 06/05/2023  This Visit's Progress: On track  Priority: High  Note:   Current Barriers:  Mail order pharmacy delayed delivery  Pharmacist Clinical Goal(s):  Patient will contact provider office for questions/concerns as evidenced notation of same in electronic health record through collaboration with PharmD and provider.   Interventions:  1:1 collaboration  with Tower, Wynelle Fanny, MD regarding development and update of comprehensive plan of care as evidenced by provider attestation and co-signature  Inter-disciplinary care team collaboration (see longitudinal plan of care)  Comprehensive medication review performed; medication list updated in electronic medical record  Hyperlipidemia: (LDL goal < 70) -Not ideally controlled - LDL 105 (02/2022) above goal; pt endorses dietary indiscretions is the reason for worse LDL; she cannot tolerate daily atorvastatin -Hx CAD (s/p CABG 2011); h/o aortic dissection -Current treatment: Aspirin EC 81 MG tablet daily - Appropriate, Effective, Safe, Accessible Krill Oil 500 MG CAPS daily - Appropriate, Effective, Safe, Accessible Atorvastatin 20 MG tablet 4x weekly -  Appropriate, Query Effective -Medications previously tried: atorvastatin daily (leg cramps), pravastatin  -Recommend continue current medications; consider rosuvastatin or adding ezetimibe  Heart Failure / Hypertension (Goal: BP < 130/80) -Controlled - reports weight stable, denies change from baseline in SOB -Followed by cardiology -Last ejection fraction: 45-50% (Date: 07/2019) -HF type: Combined Systolic and Diastolic; NYHA Class: II (slight limitation of activity) -Current treatment: Furosemide 20 MG tablet QOD + PRN - Appropriate, Effective, Safe, Accessible Carvedilol 6.25 MG tablet BID - Appropriate, Effective, Safe, Accessible Lisinopril 5 MG tablet - 1/2 tablet 4x weekly - Appropriate, Effective, Safe, Accessible Potassium chloride 10 MEQ tablet BID - Appropriate, Effective, Safe, Accessible -Medications previously tried: none -Recommend Continue current medications   Osteoporosis (Goal prevent fractures) -Not ideally controlled - discussed Sept 2022 with PCP, candidate for Reclast or Prolia - avoid starting medication until after dental work is complete -Last DEXA Scan: 07/2021   T-Score femoral neck: -2.2  T-Score total hip:  -2.0  T-Score forearm radius: -2.9 -Patient is a candidate for pharmacologic treatment due to T-Score < -2.5 -Current treatment  Vitamin D 2000 IU 4x weekly -Medications previously tried: alendronate  -Recommend [phone] units of vitamin D daily. Recommend 1200 mg of calcium daily from dietary and supplemental sources. Recommend weight-bearing and muscle strengthening exercises for building and maintaining bone density.  Depression/Anxiety (Goal: manage symptoms) -Not ideally controlled - she has been under increased stress lately, her husband was in the hospital last week; she gets 30 alprazolam per year -She ran out of venlafaxine 150 mg dose and mail order shipment is delayed -PHQ9: 0 (10/2021) - minimal depression -GAD7: 10 (06/2017) - moderate anxiety -Connected with PCP for mental health support -Current treatment: Alprazolam 0.5 mg daily PRN - Appropriate, Effective, Safe, Accessible Venlafaxine XR 150 mg daily - ran out Venlafaxine XR 37.5 mg daily - Appropriate, Effective, Safe, Accessible -Medications previously tried/failed: n/a -Educated on Benefits of medication for symptom control -Recommended to continue current medication; refilled short supply of venlafaxine 150 mg to local pharmacy  Osteoarthritis (Goal: manage pain) -Controlled - per pt report -Current treatment  Tylenol 500 mg PRN - Appropriate, Effective, Safe, Accessible Naproxen 220 mg PRN -Appropriate, Effective, Safe, Accessible Tart cherry -Appropriate, Effective, Safe, Accessible -Medications previously tried: n/a  -Recommended to continue current medication  Atypical migraine (Goal: reduce frequency) -Controlled  -massages help with migraines -Current treatment  Cyclobenzaprine 10 mg PRN -Appropriate, Effective, Safe, Accessible Promethazine 25 mg PRN -Appropriate, Effective, Safe, Accessible Tylenol 500 mg PRN -Appropriate, Effective, Safe, Accessible Naproxen 220 mg PRN -Appropriate, Effective, Safe,  Accessible -Medications previously tried: n/a  -Recommended to continue current medication  Health Maintenance -Vaccine gaps: Shingrix -Hx Afib -Hx asthma, stable. Keeps albuterol on hand. -Current therapy:  Albuterol HFA prn Cetirizine 10 mg Flonase Magnesium Melatonin Mylanta Miralax Omeprazole -Patient is satisfied with current therapy and denies issues -Recommended to continue current medication  Patient Goals/Self-Care Activities Patient will:  - take medications as prescribed as evidenced by patient report and record review focus on medication adherence by routine      Medication Assistance: None required.  Patient affirms current coverage meets needs.  Compliance/Adherence/Medication fill history: Care Gaps: None  Star-Rating Drugs: Atorvastatin - PDC 100% Lisinopril - PDC 100%  Medication Access: Within the past 30 days, how often has patient missed a dose of medication? 0 Is a pillbox or other method used to improve adherence? Yes  Factors that may affect medication adherence?  Pharmacy error Are meds synced by current pharmacy?  No  Are meds delivered by current pharmacy? Yes  Does patient experience delays in picking up medications due to transportation concerns? No   [facility] Reviewed: Is patient disadvantaged to use [facility]?: Yes  Current Rx insurance plan: [employer] [place] Name and location of Current pharmacy:  [facility] [address] [place] [address] Phone: [phone] Fax: [phone]  [id number] [place], [address] [facility] AT [address] [place] [address] Phone: [phone] Fax: [phone]  [facility] services reviewed with patient today?: No  Patient requests to transfer care to [facility]?: No  Reason patient declined to change pharmacies: Disadvantaged due to insurance/mail  order   Care Plan and Follow Up Patient Decision:  Patient agrees to Care Plan and Follow-up.  Plan: Telephone follow up appointment with care management team member scheduled for:  6 months  Charlene Brooke, PharmD, BCACP Clinical Pharmacist [facility] [phone]

## 2022-06-04 ENCOUNTER — Encounter (INDEPENDENT_AMBULATORY_CARE_PROVIDER_SITE_OTHER): Payer: Self-pay | Admitting: Family Medicine

## 2022-06-04 ENCOUNTER — Ambulatory Visit (INDEPENDENT_AMBULATORY_CARE_PROVIDER_SITE_OTHER): Payer: Medicare HMO | Admitting: Psychology

## 2022-06-04 DIAGNOSIS — F331 Major depressive disorder, recurrent, moderate: Secondary | ICD-10-CM | POA: Diagnosis not present

## 2022-06-04 NOTE — Progress Notes (Signed)
[place] Counselor/Therapist Progress Note  Patient ID: Rachel Torres, MRN: [id number],    Date: 06/04/2022  Time Spent: 10:00am-10:55am    55 minutes   Treatment Type: Individual Therapy  Reported Symptoms: stress  Mental Status Exam: Appearance:  Casual     Behavior: Appropriate  Motor: Normal  Speech/Language:  Normal Rate  Affect: Appropriate  Mood: normal  Thought process: normal  Thought content:   WNL  Sensory/Perceptual disturbances:   WNL  Orientation: oriented to person, place, time/date, and situation  Attention: Good  Concentration: Good  Memory: WNL  Fund of knowledge:  Good  Insight:   Good  Judgment:  Good  Impulse Control: Good   Risk Assessment: Danger to Self:  No Self-injurious Behavior: No Danger to Others: No Duty to Warn:no Physical Aggression / Violence:No  Access to Firearms a concern: No  Gang Involvement:No   Subjective:  [name] present for face-to-face individual therapy via video Webex.  Pt consents to telehealth video session due to COVID 19 pandemic. Location of pt: home Location of therapist: home office.   Pt talked about having a difficult couple of weeks.   Rachel Torres was in the hospital for a week.  Part of the time he was in neuro ICU.  Addressed how stressful this was for pt.   Rachel Torres is home now and is doing better.   Pt was tearful as she talked about "losing her Rachel Torres".   Pt states Rachel Torres is being impatient with her and this is not like him.   Helped pt process her feelings.   Pt talked about her relationship with her mother.   Her mother can be demanding of pt's time and attention.   Worked on self care strategies.   Provided supportive therapy.  Interventions: Cognitive Behavioral Therapy and Insight-Oriented  Diagnosis: F33.1   Plan of Care: Recommend ongoing therapy.   Pt participated in setting treatment goals.   Pt needs a place to talk about stressors.  Plan to meet every two weeks.    Treatment Plan  (Treatment Plan Target Date: 03/23/2023) Client Abilities/Strengths  Pt is bright, engaging, and motivated for therapy.   Client Treatment Preferences  Individual therapy.  Client Statement of Needs  Improve coping skills.  Have a place to talk about stressors.   Symptoms  Depressed or irritable mood. Unresolved grief issues.  Problems Addressed  Unipolar Depression Goals 1. Alleviate depressive symptoms and return to previous level of effective functioning. 2. Appropriately grieve the loss in order to normalize mood and to return to previously adaptive level of functioning. Objective Learn and implement behavioral strategies to overcome depression. Target Date: 2023-03-23 Frequency: Biweekly  Progress: 10 Modality: individual  Related Interventions Engage the client in "behavioral activation," increasing his/her activity level and contact with sources of reward, while identifying processes that inhibit activation.  Use behavioral techniques such as instruction, rehearsal, role-playing, role reversal, as needed, to facilitate activity in the client's daily life; reinforce success. Assist the client in developing skills that increase the likelihood of deriving pleasure from behavioral activation (e.g., assertiveness skills, developing an exercise plan, less internal/more external focus, increased social involvement); reinforce success. Objective Identify important people in life, past and present, and describe the quality, good and poor, of those relationships. Target Date: 2023-03-23 Frequency: Biweekly  Progress: 10 Modality: individual  Related Interventions Conduct Interpersonal Therapy beginning with the assessment of the client's "interpersonal inventory" of important past and present relationships; develop a case formulation linking depression to grief,  interpersonal role disputes, role transitions, and/or interpersonal deficits). Objective Learn and implement problem-solving and  decision-making skills. Target Date: 2023-03-23 Frequency: Biweekly  Progress: 10 Modality: individual  Related Interventions Conduct Problem-Solving Therapy using techniques such as psychoeducation, modeling, and role-playing to teach client problem-solving skills (i.e., defining a problem specifically, generating possible solutions, evaluating the pros and cons of each solution, selecting and implementing a plan of action, evaluating the efficacy of the plan, accepting or revising the plan); role-play application of the problem-solving skill to a real life issue. Encourage in the client the development of a positive problem orientation in which problems and solving them are viewed as a natural part of life and not something to be feared, despaired, or avoided. 3. Develop healthy interpersonal relationships that lead to the alleviation and help prevent the relapse of depression. 4. Develop healthy thinking patterns and beliefs about self, others, and the world that lead to the alleviation and help prevent the relapse of depression. 5. Recognize, accept, and cope with feelings of depression. Diagnosis F33.1  Conditions For Discharge Achievement of treatment goals and objectives   [name], LCSW

## 2022-06-04 NOTE — Patient Instructions (Signed)
Visit Information  Phone number for Pharmacist: [phone]   Goals Addressed   None     Care Plan : [place]  Updates made by Charlton Haws, RPH since 06/04/2022 12:00 AM     Problem: Hypertension, Hyperlipidemia, Heart Failure, Coronary Artery Disease, Depression, Anxiety, Osteoporosis, and Osteoarthritis   Priority: High     Long-Range Goal: Disease Managament   Start Date: 06/04/2022  Expected End Date: 06/05/2023  This Visit's Progress: On track  Priority: High  Note:   Current Barriers:  Mail order pharmacy delayed delivery  Pharmacist Clinical Goal(s):  Patient will contact provider office for questions/concerns as evidenced notation of same in electronic health record through collaboration with PharmD and provider.   Interventions:  1:1 collaboration with Tower, Wynelle Fanny, MD regarding development and update of comprehensive plan of care as evidenced by provider attestation and co-signature  Inter-disciplinary care team collaboration (see longitudinal plan of care)  Comprehensive medication review performed; medication list updated in electronic medical record  Hyperlipidemia: (LDL goal < 70) -Not ideally controlled - LDL 105 (02/2022) above goal; pt endorses dietary indiscretions is the reason for worse LDL; she cannot tolerate daily atorvastatin -Hx CAD (s/p CABG 2011); h/o aortic dissection -Current treatment: Aspirin EC 81 MG tablet daily - Appropriate, Effective, Safe, Accessible Krill Oil 500 MG CAPS daily - Appropriate, Effective, Safe, Accessible Atorvastatin 20 MG tablet 4x weekly - Appropriate, Query Effective -Medications previously tried: atorvastatin daily (leg cramps), pravastatin  -Recommend continue current medications; consider rosuvastatin or adding ezetimibe  Heart Failure / Hypertension (Goal: BP < 130/80) -Controlled - reports weight stable, denies change from baseline in SOB -Followed by cardiology -Last ejection fraction:  45-50% (Date: 07/2019) -HF type: Combined Systolic and Diastolic; NYHA Class: II (slight limitation of activity) -Current treatment: Furosemide 20 MG tablet QOD + PRN - Appropriate, Effective, Safe, Accessible Carvedilol 6.25 MG tablet BID - Appropriate, Effective, Safe, Accessible Lisinopril 5 MG tablet - 1/2 tablet 4x weekly - Appropriate, Effective, Safe, Accessible Potassium chloride 10 MEQ tablet BID - Appropriate, Effective, Safe, Accessible -Medications previously tried: none -Recommend Continue current medications   Osteoporosis (Goal prevent fractures) -Not ideally controlled - discussed Sept 2022 with PCP, candidate for Reclast or Prolia - avoid starting medication until after dental work is complete -Last DEXA Scan: 07/2021   T-Score femoral neck: -2.2  T-Score total hip: -2.0  T-Score forearm radius: -2.9 -Patient is a candidate for pharmacologic treatment due to T-Score < -2.5 -Current treatment  Vitamin D 2000 IU 4x weekly -Medications previously tried: alendronate  -Recommend [phone] units of vitamin D daily. Recommend 1200 mg of calcium daily from dietary and supplemental sources. Recommend weight-bearing and muscle strengthening exercises for building and maintaining bone density.  Depression/Anxiety (Goal: manage symptoms) -Not ideally controlled - she has been under increased stress lately, her husband was in the hospital last week; she gets 30 alprazolam per year -She ran out of venlafaxine 150 mg dose and mail order shipment is delayed -PHQ9: 0 (10/2021) - minimal depression -GAD7: 10 (06/2017) - moderate anxiety -Connected with PCP for mental health support -Current treatment: Alprazolam 0.5 mg daily PRN - Appropriate, Effective, Safe, Accessible Venlafaxine XR 150 mg daily - ran out Venlafaxine XR 37.5 mg daily - Appropriate, Effective, Safe, Accessible -Medications previously tried/failed: n/a -Educated on Benefits of medication for symptom  control -Recommended to continue current medication; refilled short supply of venlafaxine 150 mg to local pharmacy  Osteoarthritis (Goal: manage pain) -Controlled - per pt  report -Current treatment  Tylenol 500 mg PRN - Appropriate, Effective, Safe, Accessible Naproxen 220 mg PRN -Appropriate, Effective, Safe, Accessible Tart cherry -Appropriate, Effective, Safe, Accessible -Medications previously tried: n/a  -Recommended to continue current medication  Atypical migraine (Goal: reduce frequency) -Controlled  -massages help with migraines -Current treatment  Cyclobenzaprine 10 mg PRN -Appropriate, Effective, Safe, Accessible Promethazine 25 mg PRN -Appropriate, Effective, Safe, Accessible Tylenol 500 mg PRN -Appropriate, Effective, Safe, Accessible Naproxen 220 mg PRN -Appropriate, Effective, Safe, Accessible -Medications previously tried: n/a  -Recommended to continue current medication  Health Maintenance -Vaccine gaps: Shingrix -Hx Afib -Hx asthma, stable. Keeps albuterol on hand. -Current therapy:  Albuterol HFA prn Cetirizine 10 mg Flonase Magnesium Melatonin Mylanta Miralax Omeprazole -Patient is satisfied with current therapy and denies issues -Recommended to continue current medication  Patient Goals/Self-Care Activities Patient will:  - take medications as prescribed as evidenced by patient report and record review focus on medication adherence by routine      Patient verbalizes understanding of instructions and care plan provided today and agrees to view in [place]. Active MyChart status and patient understanding of how to access instructions and care plan via MyChart confirmed with patient.    Telephone follow up appointment with pharmacy team member scheduled for: 6 months  Charlene Brooke, PharmD, [facility] Clinical Pharmacist [facility] [phone]

## 2022-06-06 NOTE — Progress Notes (Unsigned)
Cardiology Office Note  Date:  06/07/2022   ID:  Rachel Torres, DOB 07/31/1956, MRN [id number]  PCP:  [name], MD   Chief Complaint  Patient presents with   Shortness of Breath    Patient c/o being under a lot of stress and has experienced some chest pain off & on, shortness of breath and ringing in her ears. Medications reviewed by the patient verbally.     HPI:  Rachel Torres is a 66 year-old woman with past medical history of  aorta type A dissection in September 2011   rushed to [facility] and underwent aortic valve replacement,  bypass of the right coronary artery with venous donor site from her right thigh,  aortic grafting for a extensive dissection of the  ascending aorta,  residual descending aortic dissection extending into the iliac artery,  EF 30 to 35,  up to 45 to 50% in August 2020 chronic back pain,  symptoms of shortness of breath,  abdominal pain,  hypertension  She reports pathology of her aortic valve showed bicuspid valve Chest pain episodes resolved by GI cocktail OSA not on CPAP She presents today for follow-up of her aortic valve replacement and CABG.  LOV 10/2021 Lots of stress, husband with medical issues Mother with issues, sister helping  Palliative care for husband through the [facility] a therapist [facility]  Episodes of chest pain, thinks it might be from stress Gained 15 pounds, stress eating  Last CT scan 8/22, followed by vascular  Denies any shortness of breath on exertion, no chest pain Denies any significant leg swelling PND orthopnea Takes lasix one a day Bp low, some orthostasis sx  Off ozempic: had side effects  Labs reviewed: A1C 5.5 Total chol 182  EKG personally reviewed by myself on todays visit Nsr rate 69 bpm, pvcs  Other past medical history reviewed covid 11/2019 Got it from husband ,he spent time in rehab  Echo 07/2019,  1. The left ventricle has mildly reduced systolic function, with an  ejection fraction  of 45-50%. The cavity size was mildly dilated. Left  ventricular diastolic Doppler parameters are consistent with  pseudonormalization. Left ventricular diffuse  hypokinesis.   2. The right ventricle has normal systolic function. The cavity was  normal. There is no increase in right ventricular wall thickness. Right  ventricular systolic pressure is mildly elevated with an estimated  pressure of 31.3 mmHg.   3. Left atrial size was mildly dilated.   4. A bioprosthesis valve is present in the aortic position. Procedure  Date: 08/2010 Normal aortic valve prosthesis. Mean gradient 5.5 mm Hg     Lab Results  Component Value Date   CHOL 182 03/17/2022   HDL 63.30 03/17/2022   LDLCALC 105 (H) 03/17/2022   TRIG 70.0 03/17/2022    Previous  30 day monitor ordered for palpitations,  APCs and PVCs   diagnosed with obstructive sleep apnea. She has a CPAP machine but she is not using it.   Previous evaluations in the emergency room for chest pain. Cardiac enzymes were negative , EKG unchanged .She had a CT scan dated 06/20/2013. She was transferred to [facility] given her complicated anatomy. images were evaluated by vascular surgery it was determined there have been no significant progression of her disease   Previous evaluation at [facility] for shortness of breath and chest discomfort. She had workup for cardiac issues including echocardiogram and PET stress, CTA of the chest showing stable descending aorta repair with type  B. aortic dissection with opacification of the false lumen at the level of the renal arteries via a lumbar artery.   Echocardiogram showed ejection fraction had improved to 45-50%, mild MR, TR (ejection fraction improved from 30-35% in January 2012)   Previous H/o abdominal pain, further workup showing biliary stones and sludge. s/p  laparoscopic cholecystectomy at [facility].    PMH:   has a past medical history of AAA (abdominal aortic aneurysm) ([place]), Alcohol abuse, Allergy, Anemia,  Anxiety, Asthma, CAD (coronary artery disease), Chewing difficulty, Chronic combined systolic (congestive) and diastolic (congestive) heart failure ([place]), Constipation, Depression, Edema of both lower extremities, Gallbladder sludge, GERD (gastroesophageal reflux disease), Heart failure ([place]), Heart valve problem, History of Bicuspid Aortic Valve, repair of dissecting thoracic aortic aneurysm, Stanford type A, Hyperlipidemia, Hypertension, Hypertensive heart disease, IBS (irritable bowel syndrome), Internal hemorrhoids, Kidney problem, Marfan syndrome, Migraine, Obstructive sleep apnea, Osteoarthritis, Osteoporosis, Palpitations, Pneumonia, and RA (rheumatoid arthritis) ([place]).  PSH:    Past Surgical History:  Procedure Laterality Date   ABDOMINAL AORTIC ANEURYSM REPAIR     CARDIAC CATHETERIZATION  2001   [place]     COLONOSCOPY     ESOPHAGOGASTRODUODENOSCOPY (EGD) WITH PROPOFOL N/A 05/08/2020   Procedure: ESOPHAGOGASTRODUODENOSCOPY (EGD) WITH PROPOFOL;  Surgeon: Virgel Manifold, MD;  Location: [facility] ENDOSCOPY;  Service: Endoscopy;  Laterality: N/A;   FRACTURE SURGERY Right    shoulder replacement   HEMORRHOID BANDING     ORIF DISTAL RADIUS FRACTURE Left    UPPER GASTROINTESTINAL ENDOSCOPY      Current Outpatient Medications  Medication Sig Dispense Refill   acetaminophen (TYLENOL) 500 MG tablet Take 500 mg by mouth every 6 (six) hours as needed.     albuterol (VENTOLIN HFA) 108 (90 Base) MCG/ACT inhaler Inhale 1-2 puffs into the lungs every 6 (six) hours as needed for wheezing or shortness of breath. 3 each 3   ALPRAZolam (XANAX) 0.5 MG tablet Take 1 tablet (0.5 mg total) by mouth daily as needed for anxiety. 30 tablet 0   aspirin EC 81 MG tablet Take 81 mg by mouth at bedtime.     atorvastatin (LIPITOR) 20 MG tablet Take 1 tablet (20 mg total) by mouth daily. (Patient taking differently: Take 20 mg by mouth. FOUR TIMES A WEEK) 90 tablet 3    carvedilol (COREG) 6.25 MG tablet TAKE 1 TABLET TWICE DAILY WITH A MEAL 180 tablet 3   cetirizine (ZYRTEC) 10 MG tablet Take 1 tablet (10 mg total) by mouth daily. 90 tablet 3   Cholecalciferol (VITAMIN D3) 50 MCG (2000 UT) CAPS Take 2,000 capsules by mouth. FOUR TIMES A WEEK     clindamycin (CLEOCIN) 300 MG capsule 300 mg. AS NEEDED FOR DENTAL PROCEDURES     cyclobenzaprine (FLEXERIL) 10 MG tablet Take 0.5-1 tablets (5-10 mg total) by mouth 3 (three) times daily as needed. For headache 90 tablet 3   fluticasone (FLONASE) 50 MCG/ACT nasal spray Place 2 sprays into both nostrils daily. 48 g 3   furosemide (LASIX) 20 MG tablet Take 1 tablet (20 mg total) by mouth every other day. And as needed for swelling or shortness of breath (Patient taking differently: Take 20 mg by mouth. 4 times a week) 55 tablet 3   Insulin Pen Torres (BD PEN Torres NANO U/F) 32G X 4 MM MISC Use Nano Torres with Ozempic 100 each 0   lisinopril (ZESTRIL) 5 MG tablet TAKE 1/2 TABLET EVERY DAY. (Patient taking differently: TAKE 1/2  TABLET 4x a week) 45 tablet 3   magnesium gluconate (MAGONATE) 500 MG tablet Take 500 mg by mouth at bedtime.     Melatonin 5 MG SUBL Place under the tongue at bedtime as needed.     Misc Natural Products (TART CHERRY ADVANCED) CAPS Take 1 capsule by mouth daily.     naproxen sodium (ANAPROX) 220 MG tablet Take 440 mg by mouth 2 (two) times daily as needed (for headaches/pain).     omeprazole (PRILOSEC) 40 MG capsule TAKE 1 CAPSULE EVERY DAY 90 capsule 3   polyethylene glycol powder (GLYCOLAX/MIRALAX) 17 GM/SCOOP powder Take 17 g by mouth as needed. 255 g 0   potassium chloride (KLOR-CON) 10 MEQ tablet Take 1 tablet (10 mEq total) by mouth every other day. Take with Lasix (Patient taking differently: Take 10 mEq by mouth. FOUR TIMES A WEEK (Take with Lasix)) 55 tablet 3   promethazine (PHENERGAN) 25 MG tablet Take 0.5 tablets (12.5 mg total) by mouth every 8 (eight) hours as needed for nausea. 90 tablet  3   Simethicone (MYLANTA GAS PO) Take by mouth 3 times/day as needed-between meals & bedtime.     venlafaxine XR (EFFEXOR-XR) 150 MG 24 hr capsule TAKE 1 CAPSULE EVERY DAY WITH BREAKFAST 7 capsule 0   venlafaxine XR (EFFEXOR-XR) 37.5 MG 24 hr capsule TAKE 1 CAPSULE (37.5 MG TOTAL) BY MOUTH DAILY WITH BREAKFAST. 90 capsule 2   Krill Oil (OMEGA-3) 500 MG CAPS Take 1 capsule by mouth daily. (Patient not taking: Reported on 06/07/2022)     No current facility-administered medications for this visit.     Allergies:   Fosamax [alendronate sodium], Ginzing [ginseng], Hydrocod poli-chlorphe poli er, Hydrocodone-acetaminophen, Imitrex [sumatriptan], Oxycodone, Peanut-containing drug products, Tramadol, Hydrocodone-guaifenesin, Prednisone, and Tape   Social History:  The patient  reports that she quit smoking about 43 years ago. Her smoking use included cigarettes. She has a 1.13 pack-year smoking history. She has never used smokeless tobacco. She reports current alcohol use. She reports that she does not use drugs.   Family History:   family history includes Aneurysm in her paternal grandmother; Anxiety disorder in her mother; Arthritis in her paternal grandfather, paternal grandmother, sister, and sister; Asthma in her brother, mother, sister, and sister; Colon cancer in her maternal grandmother; Depression in her mother; Diabetes in her paternal grandfather, paternal grandmother, and sister; Heart disease in her mother and sister; Hypertension in her mother; Irritable bowel syndrome in her mother; Lung cancer in her maternal grandfather; Lymphoma in her maternal grandmother; Melanoma in her maternal grandfather; Migraines in her brother, maternal grandmother, sister, and sister; Nephrolithiasis in her sister; Osteoporosis in her mother and sister; Other in her brother; Other (age of onset: 44) in her father; Stroke in her maternal grandmother and mother; Thyroid disease in her mother.    Review of  Systems: Review of Systems  Constitutional: Negative.   HENT: Negative.    Respiratory: Negative.    Cardiovascular:  Positive for chest pain.  Gastrointestinal: Negative.   Musculoskeletal: Negative.   Neurological: Negative.   Psychiatric/Behavioral: Negative.    All other systems reviewed and are negative.   PHYSICAL EXAM: VS:  BP 128/70 (BP Location: Left Arm, Patient Position: Sitting, Cuff Size: Normal)   Pulse 69   Ht '5\' 5"'$  (1.651 m)   Wt 179 lb 4 oz (81.3 kg)   SpO2 99%   BMI 29.83 kg/m  , BMI Body mass index is 29.83 kg/m. Constitutional:  oriented to person, place,  and time. No distress.  HENT:  Head: Grossly normal Eyes:  no discharge. No scleral icterus.  Neck: No JVD, no carotid bruits  Cardiovascular: Regular rate and rhythm, no murmurs appreciated Pulmonary/Chest: Clear to auscultation bilaterally, no wheezes or rails Abdominal: Soft.  no distension.  no tenderness.  Musculoskeletal: Normal range of motion Neurological:  normal muscle tone. Coordination normal. No atrophy Skin: Skin warm and dry Psychiatric: normal affect, pleasant   Recent Labs: 07/10/2021: Hemoglobin 13.9; Platelets 274.0; TSH 1.54 12/03/2021: ALT 16 03/17/2022: BUN 12; Creatinine, Ser 0.82; Potassium 4.7; Sodium 136    Lipid Panel Lab Results  Component Value Date   CHOL 182 03/17/2022   HDL 63.30 03/17/2022   LDLCALC 105 (H) 03/17/2022   TRIG 70.0 03/17/2022     Wt Readings from Last 3 Encounters:  06/07/22 179 lb 4 oz (81.3 kg)  05/12/22 170 lb (77.1 kg)  04/15/22 163 lb (73.9 kg)     ASSESSMENT AND PLAN:  Pure hypercholesterolemia Cholesterol high likely from dietary indiscretion Recommend she add Zetia to her Lipitor 4 days a week  Essential hypertension -  Blood pressure is well controlled on today's visit. No changes made to the medications.  CORONARY ARTERY BYPASS GRAFT Chronic stable angina Sx stable, no further testing needed Aggressive cholesterol  management as above  Dissection of thoracoabdominal aorta (HCC) Followed by vascular surgeryin [place], Dr. Aurelio Jew on recent eval , CT 07/2019, reviewed Has done well over 10 years later  H/O dissecting abdominal aortic aneurysm repair -  MRI 2020  Followed by vascular in [place]  Chronic diastolic CHF euvolemic echo EF 45% Recommend she try to avoid overdiuresis with Lasix  Abdominal pain Symptoms improved  Morbid obesity Previously tried Ozempic but had side effects Dietary discretion discussed  Adjustment disorder Significant stress at home, husband and mother   Total encounter time more than 30 minutes  Greater than 50% was spent in counseling and coordination of care with the patient    No orders of the defined types were placed in this encounter.    Signed, Esmond Plants, M.D., Ph.D. 06/07/2022  [place], [place]

## 2022-06-07 ENCOUNTER — Encounter: Payer: Self-pay | Admitting: Cardiovascular Disease

## 2022-06-07 ENCOUNTER — Ambulatory Visit (INDEPENDENT_AMBULATORY_CARE_PROVIDER_SITE_OTHER): Payer: Medicare HMO | Admitting: Cardiovascular Disease

## 2022-06-07 ENCOUNTER — Ambulatory Visit (INDEPENDENT_AMBULATORY_CARE_PROVIDER_SITE_OTHER): Payer: Medicare HMO | Admitting: Family Medicine

## 2022-06-07 ENCOUNTER — Telehealth (INDEPENDENT_AMBULATORY_CARE_PROVIDER_SITE_OTHER): Payer: Self-pay | Admitting: *Deleted

## 2022-06-07 VITALS — BP 128/70 | HR 69 | Ht 65.0 in | Wt 179.2 lb

## 2022-06-07 DIAGNOSIS — Z952 Presence of prosthetic heart valve: Secondary | ICD-10-CM

## 2022-06-07 DIAGNOSIS — I10 Essential (primary) hypertension: Secondary | ICD-10-CM | POA: Diagnosis not present

## 2022-06-07 DIAGNOSIS — R0600 Dyspnea, unspecified: Secondary | ICD-10-CM | POA: Diagnosis not present

## 2022-06-07 DIAGNOSIS — I5042 Chronic combined systolic (congestive) and diastolic (congestive) heart failure: Secondary | ICD-10-CM | POA: Diagnosis not present

## 2022-06-07 DIAGNOSIS — I7103 Dissection of thoracoabdominal aorta: Secondary | ICD-10-CM

## 2022-06-07 DIAGNOSIS — Z8679 Personal history of other diseases of the circulatory system: Secondary | ICD-10-CM

## 2022-06-07 DIAGNOSIS — E7849 Other hyperlipidemia: Secondary | ICD-10-CM

## 2022-06-07 DIAGNOSIS — I480 Paroxysmal atrial fibrillation: Secondary | ICD-10-CM

## 2022-06-07 DIAGNOSIS — I2510 Atherosclerotic heart disease of native coronary artery without angina pectoris: Secondary | ICD-10-CM

## 2022-06-07 MED ORDER — EZETIMIBE 10 MG PO TABS: 10.0000 mg | ORAL_TABLET | Freq: Every day | ORAL | 3 refills | Status: DC

## 2022-06-07 MED ORDER — NITROGLYCERIN 0.4 MG SL SUBL: 0.4000 mg | SUBLINGUAL_TABLET | SUBLINGUAL | 3 refills | Status: DC | PRN

## 2022-06-07 NOTE — Patient Instructions (Addendum)
Medication Instructions:  Please start zetia 10 mg daily for cholesterol  NTG as needed for chest pain  If you need a refill on your cardiac medications before your next appointment, please call your pharmacy.   Lab work: No new labs needed  Testing/Procedures: No new testing needed  Follow-Up: At [facility], you and your health needs are our priority.  As part of our continuing mission to provide you with exceptional heart care, we have created designated Provider Care Teams.  These Care Teams include your primary Cardiologist (physician) and Advanced Practice Providers (APPs -  Physician Assistants and Nurse Practitioners) who all work together to provide you with the care you need, when you need it.  You will need a follow up appointment in 12 months  Providers on your designated Care Team:   Murray Hodgkins, NP Christell Faith, PA-C Cadence Kathlen Mody, [place]  COVID-19 Vaccine Information can be found at: [email or web address] For questions related to vaccine distribution or appointments, please email [email or web address] or call [phone].

## 2022-06-07 NOTE — Progress Notes (Signed)
[facility] Consult Note Telephone: [phone]  Fax: [phone]   Date of encounter: 06/01/2022  PATIENT NAME: Rachel Torres [place] [address]   [phone] (home)  DOB: 09/28/1956 MRN: [id number] PRIMARY CARE PROVIDER:    [name], MD,  [place] [address] [phone]  REFERRING PROVIDER:   [name], MD [address] [place],  [place] [address] [phone]  RESPONSIBLE PARTY:    Contact Information     Name Relation Home Work [place] Son   [phone]   Rachel Torres [id number]  [phone]       Due to the COVID-19 crisis, this visit was done via telemedicine from my office and it was initiated and consent by this patient and or family.  I connected with  Rachel Torres OR PROXY on 06/01/2022 by a video enabled telemedicine application and verified that I am speaking with the correct person using two identifiers.   I discussed the limitations of evaluation and management by telemedicine. The patient expressed understanding and agreed to proceed.                                    ASSESSMENT AND PLAN / RECOMMENDATIONS:   Advance Care Planning/Goals of Care: Goals include to maximize quality of life and symptom management. Patient/health care surrogate gave his/her permission to discuss.Our advance care planning conversation included a discussion about:    The value and importance of advance care planning  Experiences with loved ones who have been seriously ill or have died  Exploration of personal, cultural or spiritual beliefs that might influence medical decisions  Exploration of goals of care in the event of a sudden injury or illness  Husband is HCPOA; sons secondary.  CODE STATUS: Full Code  Education provided on Palliative Medicine. Will provide ongoing support, symptom management as needed. Will refer to  Palliative SW for support. Encourage patient [facility] with [facility] to see if she can receive additional support for her husband.   Symptom Management/Plan:  Depression with anxiety-patient expresses feeling overwhelmed due to husband with health issues and she is the primary caregiver. Continue venlafaxine 150 mg + 37.5 mg, awaiting total prescription from pharmacy. Continue alprazolam 0.5 mg QD PRN anxiety. Continue counseling support services; will also refer to palliative SW.    Appetite-patient endorses around 10 pound weight gain in the past month; attributes due to stress. Education provided on diet. Recommend continuing Healthy weight and Wellness program.   Combined systolic and diastolic HF- endorses shortness of breath with exertion. Continue carvedilol, furosemide 20 mg 4 x week, lisinopril as directed. Monitor for worsening symptoms such as fatigue, edema, shortness of breath.   Follow up Palliative Care Visit: Palliative care will continue to follow for complex medical decision making, advance care planning, and clarification of goals. Return in 6-8 weeks or prn.   This visit was coded based on medical decision making (MDM).  PPS: 70%  HOSPICE ELIGIBILITY/DIAGNOSIS: TBD  Chief Complaint: Palliative Medicine initial consult.  HISTORY OF PRESENT ILLNESS:  Rachel Torres is a 66 y.o. year old female  with depression with anxiety, combined CHF, paroxysmal atrial fibrillation, CAD, hypertension, hyperlipidemia, OA, osteoporosis, prediabetes, allergic rhinitis, GERD, IBS.   Patient expresses feeling overwhelmed as her husband discharged from hospital today. He is requiring more assistance; she  is the primary caregiver. He does receive aid in attendance support through the [place]. She states her pain is managed, endorses shortness of breath with exertion. She states she is a "stress eater." She has gained around 10 pounds in the past month. She attends Healthy weight and wellness monthly.  Patient reports there was discrepancy in her venlafaxine orders, so she was not receiving the full strength ('150mg'$  + 37.5 mg). This has been addressed and she will be seeing the correct dosage. A 10-point ROS is negative, except for the pertinent positives and negatives detailed per the HPI.   History obtained from review of EMR, discussion with primary team, and interview with family, facility staff/caregiver and/or Rachel Torres.  I reviewed available labs, medications, imaging, studies and related documents from the EMR.  Records reviewed and summarized above.    Physical Exam: Constitutional: NAD General: appears anxious EYES: anicteric sclera, lids intact, no discharge  ENMT: intact hearing, oral mucous membranes moist, dentition intact Pulmonary: no increased work of breathing, no cough, room air GU: deferred MSK: moves all extremities, ambulatory Skin: no rashes or wounds on visible skin Neuro:  no generalized weakness,  no cognitive impairment Psych: anxious affect, A and O x 3 Hem/lymph/immuno: no widespread bruising CURRENT PROBLEM LIST:  Patient Active Problem List   Diagnosis Date Noted   Other constipation 05/12/2022   Medicare annual wellness visit, initial 07/17/2021   TMJ (dislocation of temporomandibular joint) 07/17/2021   Encounter for screening mammogram for breast cancer 06/05/2021   Diarrhea 04/11/2020   Epigastric pain 02/19/2020   Dark stools 02/19/2020   PAF (paroxysmal atrial fibrillation) ([place]) 02/05/2019   Shortness of breath 01/27/2019   Irregular surface of cornea 12/25/2018   HPV (human papilloma virus) infection 12/01/2016   Screening mammogram, encounter for 11/29/2016   Estrogen deficiency 11/29/2016   Encounter for routine gynecological examination 11/29/2016   Grade III hemorrhoids 10/06/2016   Tachycardia 08/14/2016   Hemorrhoids 08/13/2016   Atypical migraine 08/03/2016   CAD (coronary artery disease)    AAA (abdominal aortic aneurysm) (HCC)     Chronic combined systolic (congestive) and diastolic (congestive) heart failure ([place])    Prediabetes 02/11/2016   Generalized anxiety disorder 12/08/2015   Panic attacks 07/28/2015   Sensorineural hearing loss 03/27/2013   Vitamin D deficiency 07/03/2012   H/O aortic dissection 05/12/2012   Routine general medical examination at a health care facility 09/20/2011   S/P AVR (aortic valve replacement) 01/07/2011   CORONARY ARTERY BYPASS GRAFT, HX OF 01/07/2011   Arthritis 12/20/2010   Back pain 12/15/2010   H/O dissecting abdominal aortic aneurysm repair 08/24/2010   IRRITABLE BOWEL SYNDROME 09/09/2009   Obstructive sleep apnea 08/04/2009   External hemorrhoid 06/26/2009   Osteoporosis 01/09/2009   Hyperlipidemia 07/26/2007   Depression with anxiety 07/26/2007   Essential hypertension 07/26/2007   Allergic rhinitis 07/26/2007   Asthma 07/26/2007   GERD 07/26/2007   Osteoarthritis 07/26/2007   Ocular migraine 07/26/2007   Mild intermittent asthma without complication 10/13/8526   PAST MEDICAL HISTORY:  Active Ambulatory Problems    Diagnosis Date Noted   Hyperlipidemia 07/26/2007   Depression with anxiety 07/26/2007   Essential hypertension 07/26/2007   External hemorrhoid 06/26/2009   Allergic rhinitis 07/26/2007   Asthma 07/26/2007   GERD 07/26/2007   IRRITABLE BOWEL SYNDROME 09/09/2009   Osteoarthritis 07/26/2007   Osteoporosis 01/09/2009   Obstructive sleep apnea 08/04/2009   Ocular migraine 07/26/2007   Back pain 12/15/2010   S/P AVR (aortic valve replacement)  01/07/2011   CORONARY ARTERY BYPASS GRAFT, HX OF 01/07/2011   Routine general medical examination at a health care facility 09/20/2011   H/O aortic dissection 05/12/2012   Vitamin D deficiency 07/03/2012   Prediabetes 02/11/2016   CAD (coronary artery disease)    AAA (abdominal aortic aneurysm) (HCC)    Chronic combined systolic (congestive) and diastolic (congestive) heart failure (HCC)    Atypical  migraine 08/03/2016   Hemorrhoids 08/13/2016   Tachycardia 08/14/2016   Arthritis 12/20/2010   Generalized anxiety disorder 12/08/2015   H/O dissecting abdominal aortic aneurysm repair 08/24/2010   Mild intermittent asthma without complication 49/44/9675   Panic attacks 07/28/2015   Sensorineural hearing loss 03/27/2013   Grade III hemorrhoids 10/06/2016   Screening mammogram, encounter for 11/29/2016   Estrogen deficiency 11/29/2016   Encounter for routine gynecological examination 11/29/2016   HPV (human papilloma virus) infection 12/01/2016   PAF (paroxysmal atrial fibrillation) ([place]) 02/05/2019   Epigastric pain 02/19/2020   Dark stools 02/19/2020   Diarrhea 04/11/2020   Irregular surface of cornea 12/25/2018   Shortness of breath 01/27/2019   Encounter for screening mammogram for breast cancer 06/05/2021   Medicare annual wellness visit, initial 07/17/2021   TMJ (dislocation of temporomandibular joint) 07/17/2021   Other constipation 05/12/2022   Resolved Ambulatory Problems    Diagnosis Date Noted   PERSISTENT DISORDER INITIATING/MAINTAINING SLEEP 08/12/2009   CORONARY ARTERY SPASM 07/26/2007   Cardiac dysrhythmia 07/26/2007   INSOMNIA 04/02/2008   MURMUR 07/26/2007   POSTMENOPAUSAL STATUS 12/02/2008   AAA 12/11/2010   GALLBLADDER DISEASE 12/11/2010   FATIGUE 12/11/2010   UNSPECIFIED ANEMIA 01/18/2011   DIZZINESS 01/06/2011   Congestive dilated cardiomyopathy ([place]) 02/04/2011   URINARY RETENTION 02/12/2011   Bronchitis 05/03/2011   URI (upper respiratory infection) 08/18/2011   Atypical chest pain 09/15/2011   Gynecological examination 09/20/2011   Bronchitis 10/04/2011   Unspecified vitamin D deficiency 10/19/2011   Class 2 obesity due to excess calories with body mass index (BMI) of 35.0 to 35.9 in adult 11/21/2011   Anemia 05/12/2012   Nausea 05/24/2012   Vertigo 08/18/2012   Other screening mammogram 01/15/2013   Cramping of feet 04/24/2013   Chest pain  07/09/2013   Change in vision 02/19/2015   Angina pectoris ([place]) 02/09/2016   History of aneurysm    Morbid obesity due to excess calories (HCC)    S/P CABG x 1    Elevated troponin 02/11/2016   Hypertensive heart disease    Sweating 07/12/2016   Major depressive disorder, single episode 08/24/2010   Obesity (BMI 30.0-34.9) 11/21/2011   Type 1 dissection of ascending aorta ([place]) 04/23/2017   Cough 09/08/2017   Influenza A 02/05/2019   Puncture wound of fifth toe of right foot 04/02/2019   Acute bronchitis with bronchospasm 05/28/2020   Dissection of thoracic aorta ([place]) 12/11/2010   Abdominal pain, right lateral 10/21/2020   Type 2 diabetes mellitus with other specified complication, without long-term current use of insulin ([place]) 07/18/2021   Past Medical History:  Diagnosis Date   Alcohol abuse    Allergy    Anxiety    Chewing difficulty    Constipation    Depression    Edema of both lower extremities    Gallbladder sludge    GERD (gastroesophageal reflux disease)    Heart failure (HCC)    Heart valve problem    History of Bicuspid Aortic Valve    Hx of repair of dissecting thoracic aortic aneurysm, Stanford type A  Hypertension    IBS (irritable bowel syndrome)    Internal hemorrhoids    Kidney problem    Marfan syndrome    Migraine    Osteoporosis    Palpitations    Pneumonia    RA (rheumatoid arthritis) ([place])    SOCIAL HX:  Social History   Tobacco Use   Smoking status: Former    Packs/day: 0.75    Years: 1.50    Total pack years: 1.13    Types: Cigarettes    Quit date: 12/20/1978    Years since quitting: 43.4   Smokeless tobacco: Never  Substance Use Topics   Alcohol use: Yes    Alcohol/week: 0.0 standard drinks of alcohol    Comment: 2-3 glasses of red wine per week   FAMILY HX:  Family History  Problem Relation Age of Onset   Hypertension Mother    Depression Mother    Osteoporosis Mother    Stroke Mother    Irritable bowel syndrome Mother     Asthma Mother    Heart disease Mother    Thyroid disease Mother    Anxiety disorder Mother    Arthritis Sister    Diabetes Sister    Heart disease Sister    Asthma Sister    Migraines Sister    Nephrolithiasis Sister    Osteoporosis Sister    Arthritis Sister    Asthma Sister    Migraines Sister    Other Father 33   Stroke Maternal Grandmother    Colon cancer Maternal Grandmother    Lymphoma Maternal Grandmother    Migraines Maternal Grandmother    Aneurysm Paternal Grandmother        brain   Diabetes Paternal Grandmother    Arthritis Paternal Grandmother    Arthritis Paternal Grandfather    Diabetes Paternal Grandfather    Other Brother        prediabeties   Asthma Brother    Migraines Brother    Lung cancer Maternal Grandfather    Melanoma Maternal Grandfather       ALLERGIES:  Allergies  Allergen Reactions   Fosamax [Alendronate Sodium]     GI upset    Ginzing [Ginseng] Other (See Comments)    Hyper and jitery    Hydrocod Poli-Chlorphe Poli Er Itching   Hydrocodone-Acetaminophen Itching   Imitrex [Sumatriptan]     Contraindicated w/ heart condition   Oxycodone Itching   Peanut-Containing Drug Products Other (See Comments)    Reaction:  Migraines    Tramadol Other (See Comments)    itching   Hydrocodone-Guaifenesin Itching   Prednisone Rash   Tape Itching     PERTINENT MEDICATIONS:  Outpatient Encounter Medications as of 06/01/2022  Medication Sig   acetaminophen (TYLENOL) 500 MG tablet Take 500 mg by mouth every 6 (six) hours as needed.   albuterol (VENTOLIN HFA) 108 (90 Base) MCG/ACT inhaler Inhale 1-2 puffs into the lungs every 6 (six) hours as needed for wheezing or shortness of breath.   ALPRAZolam (XANAX) 0.5 MG tablet Take 1 tablet (0.5 mg total) by mouth daily as needed for anxiety.   aspirin EC 81 MG tablet Take 81 mg by mouth at bedtime.   atorvastatin (LIPITOR) 20 MG tablet Take 1 tablet (20 mg total) by mouth daily. (Patient taking  differently: Take 20 mg by mouth. FOUR TIMES A WEEK)   carvedilol (COREG) 6.25 MG tablet TAKE 1 TABLET TWICE DAILY WITH A MEAL   cetirizine (ZYRTEC) 10 MG tablet Take 1 tablet (  10 mg total) by mouth daily.   Cholecalciferol (VITAMIN D3) 50 MCG (2000 UT) CAPS Take 2,000 capsules by mouth. FOUR TIMES A WEEK   clindamycin (CLEOCIN) 300 MG capsule 300 mg. AS NEEDED FOR DENTAL PROCEDURES   cyclobenzaprine (FLEXERIL) 10 MG tablet Take 0.5-1 tablets (5-10 mg total) by mouth 3 (three) times daily as needed. For headache   fluticasone (FLONASE) 50 MCG/ACT nasal spray Place 2 sprays into both nostrils daily.   furosemide (LASIX) 20 MG tablet Take 1 tablet (20 mg total) by mouth every other day. And as needed for swelling or shortness of breath (Patient taking differently: Take 20 mg by mouth. 4 times a week)   Insulin Pen Needle (BD PEN NEEDLE NANO U/F) 32G X 4 MM MISC Use Nano needle with Ozempic   Krill Oil (OMEGA-3) 500 MG CAPS Take 1 capsule by mouth daily.   lisinopril (ZESTRIL) 5 MG tablet TAKE 1/2 TABLET EVERY DAY. (Patient taking differently: TAKE 1/2 TABLET 4x a week)   magnesium gluconate (MAGONATE) 500 MG tablet Take 500 mg by mouth at bedtime.   Melatonin 5 MG SUBL Place under the tongue at bedtime as needed.   Misc Natural Products (TART CHERRY ADVANCED) CAPS Take 1 capsule by mouth daily.   naproxen sodium (ANAPROX) 220 MG tablet Take 440 mg by mouth 2 (two) times daily as needed (for headaches/pain).   omeprazole (PRILOSEC) 40 MG capsule TAKE 1 CAPSULE EVERY DAY   polyethylene glycol powder (GLYCOLAX/MIRALAX) 17 GM/SCOOP powder Take 17 g by mouth as needed.   potassium chloride (KLOR-CON) 10 MEQ tablet Take 1 tablet (10 mEq total) by mouth every other day. Take with Lasix (Patient taking differently: Take 10 mEq by mouth. FOUR TIMES A WEEK (Take with Lasix))   promethazine (PHENERGAN) 25 MG tablet Take 0.5 tablets (12.5 mg total) by mouth every 8 (eight) hours as needed for nausea.    Simethicone (MYLANTA GAS PO) Take by mouth 3 times/day as needed-between meals & bedtime.   venlafaxine XR (EFFEXOR-XR) 37.5 MG 24 hr capsule TAKE 1 CAPSULE (37.5 MG TOTAL) BY MOUTH DAILY WITH BREAKFAST.   [DISCONTINUED] venlafaxine XR (EFFEXOR-XR) 150 MG 24 hr capsule TAKE 1 CAPSULE EVERY DAY WITH BREAKFAST   No facility-administered encounter medications on file as of 06/01/2022.   Thank you for the opportunity to participate in the care of Ms. Wroe.  The palliative care team will continue to follow. Please call our office at [phone] if we can be of additional assistance.   Ezekiel Slocumb, NP   COVID-19 PATIENT SCREENING TOOL Asked and negative response unless otherwise noted:  Have you had symptoms of covid, tested positive or been in contact with someone with symptoms/positive test in the past 5-10 days? No

## 2022-06-07 NOTE — Telephone Encounter (Signed)
Patient sent Mychart message as noted below to cancel her appointment today with Dr. Leafy Ro. Please see noted:  Do to medical emergency with my husband I will not be able to come to my appointment at 4:00 06/07/22. Please call me to reschedule. My husband was in ICU for 3 days and in the hospital a total of 5 days. Also on the 19th I have no caregiver and 3 appointments!  [phone]   Thank you, Tommie Sams

## 2022-06-11 ENCOUNTER — Telehealth: Payer: Self-pay

## 2022-06-17 ENCOUNTER — Ambulatory Visit: Payer: Medicare HMO

## 2022-06-17 DIAGNOSIS — Z789 Other specified health status: Secondary | ICD-10-CM

## 2022-06-17 NOTE — Progress Notes (Signed)
Palliative care SW outreached patient to complete telephonic follow up visit.    Palliative care SW outreached patient to complete telephonic follow visit, per [facility] NP -L [facility] SW referral request to offer additional support to patient.  During call, patient endorses that she was currently at the hospital with her husband was expected to DC today after a weeklong stay. Patient stated that she will have some in home support post her husband's DC through [facility] services as well [facility] aid & attendance. Spouse has 16 hours a week of aid & attendance, of which cannot be increased until spouses VA disability pension is increased.   Psychosocial assessment: completed.   Caregiver support: Patient share that she is apart of 2 caregiver support groups already, one through the [place] and another through the [employer]. Patient also has a therapist that she meets with on a routine basis. Patient denied any additional support needs at this time from [facility], other than periodic check-ins.   Next in home PC visit scheduled for 06/28/22 '@11am'$  with PC NP.    Palliative care will continue to monitor and assist with long term care planning as needed.

## 2022-06-18 ENCOUNTER — Ambulatory Visit (INDEPENDENT_AMBULATORY_CARE_PROVIDER_SITE_OTHER): Payer: Medicare HMO | Admitting: Psychology

## 2022-06-18 DIAGNOSIS — F331 Major depressive disorder, recurrent, moderate: Secondary | ICD-10-CM | POA: Diagnosis not present

## 2022-06-18 DIAGNOSIS — M810 Age-related osteoporosis without current pathological fracture: Secondary | ICD-10-CM

## 2022-06-18 DIAGNOSIS — E785 Hyperlipidemia, unspecified: Secondary | ICD-10-CM | POA: Diagnosis not present

## 2022-06-18 DIAGNOSIS — I509 Heart failure, unspecified: Secondary | ICD-10-CM | POA: Diagnosis not present

## 2022-06-18 DIAGNOSIS — M1990 Unspecified osteoarthritis, unspecified site: Secondary | ICD-10-CM

## 2022-06-18 DIAGNOSIS — I110 Hypertensive heart disease with heart failure: Secondary | ICD-10-CM

## 2022-06-18 NOTE — Progress Notes (Signed)
[facility] Counselor/Therapist Progress Note  Patient ID: CHAYCE RULLO, MRN: [id number],    Date: 06/18/2022  Time Spent: 11:00am-11:55am    55 minutes   Treatment Type: Individual Therapy  Reported Symptoms: stress  Mental Status Exam: Appearance:  Casual     Behavior: Appropriate  Motor: Normal  Speech/Language:  Normal Rate  Affect: Appropriate  Mood: normal  Thought process: normal  Thought content:   WNL  Sensory/Perceptual disturbances:   WNL  Orientation: oriented to person, place, time/date, and situation  Attention: Good  Concentration: Good  Memory: WNL  Fund of knowledge:  Good  Insight:   Good  Judgment:  Good  Impulse Control: Good   Risk Assessment: Danger to Self:  No Self-injurious Behavior: No Danger to Others: No Duty to Warn:no Physical Aggression / Violence:No  Access to Firearms a concern: No  Gang Involvement:No   Subjective:  [name] present for face-to-face individual therapy via video Webex.  Pt consents to telehealth video session due to COVID 19 pandemic. Location of pt: home Location of therapist: home office.   Pt talked about her husband [name].  He was in the hospital for a few days bc of having hallucinations.  Pt is worried about [name].  His parkinson's disease is progressing.  He is showing signs of lewy body dementia.  Caregiving for pt is getting more stressful.   Addressed pt's stress and concerns.  Pt talked about gaining 20 lbs in the last month and a half bc of stress eating.   Pt is trying to exercise but has trouble with her eating.   Worked on coping strategies.   Pt adopted a new kitten.  She and [name] are happy with their new "baby".   Pt talked about family.   Her mother calls her 5 times a day to check in.   [name] sons call her at times to check in with how she and [name] are doing.  Pt appreciates the support from her sons but her mother can be a burden at times.   Pt talked about having trouble with  sleeping.  She tends to wake up in the middle of the night and has trouble getting back to sleep.  Worked on sleep strategies.  Worked on self care strategies.   Provided supportive therapy.  Interventions: Cognitive Behavioral Therapy and Insight-Oriented  Diagnosis: F33.1   Plan of Care: Recommend ongoing therapy.   Pt participated in setting treatment goals.   Pt needs a place to talk about stressors.  Plan to meet every two weeks.    Treatment Plan (Treatment Plan Target Date: 03/23/2023) Client Abilities/Strengths  Pt is bright, engaging, and motivated for therapy.   Client Treatment Preferences  Individual therapy.  Client Statement of Needs  Improve coping skills.  Have a place to talk about stressors.   Symptoms  Depressed or irritable mood. Unresolved grief issues.  Problems Addressed  Unipolar Depression Goals 1. Alleviate depressive symptoms and return to previous level of effective functioning. 2. Appropriately grieve the loss in order to normalize mood and to return to previously adaptive level of functioning. Objective Learn and implement behavioral strategies to overcome depression. Target Date: 2023-03-23 Frequency: Biweekly  Progress: 10 Modality: individual  Related Interventions Engage the client in "behavioral activation," increasing his/her activity level and contact with sources of reward, while identifying processes that inhibit activation.  Use behavioral techniques such as instruction, rehearsal, role-playing, role reversal, as needed, to facilitate activity in the client's daily life;  reinforce success. Assist the client in developing skills that increase the likelihood of deriving pleasure from behavioral activation (e.g., assertiveness skills, developing an exercise plan, less internal/more external focus, increased social involvement); reinforce success. Objective Identify important people in life, past and present, and describe the quality, good and  poor, of those relationships. Target Date: 2023-03-23 Frequency: Biweekly  Progress: 10 Modality: individual  Related Interventions Conduct Interpersonal Therapy beginning with the assessment of the client's "interpersonal inventory" of important past and present relationships; develop a case formulation linking depression to grief, interpersonal role disputes, role transitions, and/or interpersonal deficits). Objective Learn and implement problem-solving and decision-making skills. Target Date: 2023-03-23 Frequency: Biweekly  Progress: 10 Modality: individual  Related Interventions Conduct Problem-Solving Therapy using techniques such as psychoeducation, modeling, and role-playing to teach client problem-solving skills (i.e., defining a problem specifically, generating possible solutions, evaluating the pros and cons of each solution, selecting and implementing a plan of action, evaluating the efficacy of the plan, accepting or revising the plan); role-play application of the problem-solving skill to a real life issue. Encourage in the client the development of a positive problem orientation in which problems and solving them are viewed as a natural part of life and not something to be feared, despaired, or avoided. 3. Develop healthy interpersonal relationships that lead to the alleviation and help prevent the relapse of depression. 4. Develop healthy thinking patterns and beliefs about self, others, and the world that lead to the alleviation and help prevent the relapse of depression. 5. Recognize, accept, and cope with feelings of depression. Diagnosis F33.1  Conditions For Discharge Achievement of treatment goals and objectives   [name], LCSW

## 2022-06-25 DIAGNOSIS — M9901 Segmental and somatic dysfunction of cervical region: Secondary | ICD-10-CM | POA: Diagnosis not present

## 2022-06-25 DIAGNOSIS — M50322 Other cervical disc degeneration at C5-C6 level: Secondary | ICD-10-CM | POA: Diagnosis not present

## 2022-06-25 DIAGNOSIS — M9902 Segmental and somatic dysfunction of thoracic region: Secondary | ICD-10-CM | POA: Diagnosis not present

## 2022-06-25 DIAGNOSIS — M546 Pain in thoracic spine: Secondary | ICD-10-CM | POA: Diagnosis not present

## 2022-06-28 ENCOUNTER — Other Ambulatory Visit: Payer: Medicare HMO | Admitting: Student

## 2022-06-28 DIAGNOSIS — I5042 Chronic combined systolic (congestive) and diastolic (congestive) heart failure: Secondary | ICD-10-CM

## 2022-06-28 DIAGNOSIS — G43909 Migraine, unspecified, not intractable, without status migrainosus: Secondary | ICD-10-CM

## 2022-06-28 DIAGNOSIS — Z515 Encounter for palliative care: Secondary | ICD-10-CM

## 2022-06-28 DIAGNOSIS — F418 Other specified anxiety disorders: Secondary | ICD-10-CM

## 2022-06-28 NOTE — Progress Notes (Signed)
[facility] Pall[facility] Consult Note Telephone: [phone]  Fax: [phone]    Date of encounter: 06/28/22 11:20 AM PATIENT NAME: Rachel Torres [id number]   [phone] (home)  DOB: 09-06-1956 MRN: [id number] PRIMARY CARE PROVIDER:    [name], MD,  [place] [address] [phone]  REFERRING PROVIDER:   [name], MD [address] [place],  [place] [address] [phone]  RESPONSIBLE PARTY:    Contact Information     Name Relation Home Work [place] Son   [phone]   Rachel Torres, Rachel Torres [phone]  [phone]        I met face to face with patient and family in the home. Palliative Care was asked to follow this patient by consultation request of  Tower, Wynelle Fanny, MD to address advance care planning and complex medical decision making. This is a follow up visit.                                   ASSESSMENT AND PLAN / RECOMMENDATIONS:   Advance Care Planning/Goals of Care: Goals include to maximize quality of life and symptom management. Patient/health care surrogate gave his/her permission to discuss. Our advance care planning conversation included a discussion about:    The value and importance of advance care planning  Experiences with loved ones who have been seriously ill or have died  Exploration of personal, cultural or spiritual beliefs that might influence medical decisions  Exploration of goals of care in the event of a sudden injury or illness  CODE STATUS: Full Code  Education provided on Palliative Medicine; will continue to provide supportive care, symptom management as needed.   Symptom Management/Plan:  Depression and anxiety- patient has missed several doses recently of her venlafaxine due to being overwhelmed caring for husband. We discussed reminders, pill box. Continue as venlafaxine and PRN alprazolam as  directed. Encourage patient to participate in activities she finds interest/pleasure in. Continue counseling services once every 2 weeks. Encourage exercise as tolerated. Palliative SW also involved. Patient does have caregiver through [place] for her husband, but is needing additional support.   Combined systolic and diastolic HF- endorses shortness of breath with exertion. Continue carvedilol, furosemide 20 mg 4 x week, lisinopril as directed. Monitor for worsening symptoms such as fatigue, edema, shortness of breath. Patient does not weigh daily due to her history of anorexia, weight. We discussed monitoring how her clothes fit around abdomen as she does not usually have LE edema, but does gain in her abdomen as well as monitor for increased fatigue, shortness of breath.   Migraines-endorses occasional migraines. Continue flexeril, phenergan and naproxen PRN.   Follow up Palliative Care Visit: Palliative care will continue to follow for complex medical decision making, advance care planning, and clarification of goals. Return in 8 weeks or prn.  This visit was coded based on medical decision making (MDM).  PPS: 70%  HOSPICE ELIGIBILITY/DIAGNOSIS: TBD  Chief Complaint: Palliative Medicine follow up visit.   HISTORY OF PRESENT ILLNESS:  Rachel Torres is a 66 y.o. year old female  with depression with anxiety, combined CHF, paroxysmal atrial fibrillation, CAD, hypertension, hyperlipidemia, OA, osteoporosis, prediabetes, allergic rhinitis, GERD, IBS.     Patient forgetting to take medications. Expresses feeling overwhelmed, caregiver fatigue. Napping during the day more. Experiences migraines; takes flexeril and  phenergan. Seeing chiropractor for neck and back pain. Denies shortness of breath at rest; endorses with exertion. Taking miralax PRN. Reports stress eating; follows up once a month with [facility]. A 10-point ROS is negative, except for the pertinent positives and  negatives detailed per the HPI.   History obtained from review of EMR, discussion with primary team, and interview with family, facility staff/caregiver and/or Rachel Torres.  I reviewed available labs, medications, imaging, studies and related documents from the EMR.  Records reviewed and summarized above.    Physical Exam:  Pulse 84, resp 16, b/p 140/78, sats 95-97% on room air.  Constitutional: NAD General: frail appearing EYES: anicteric sclera, lids intact, no discharge  ENMT: intact hearing, oral mucous membranes moist, dentition intact CV: S1S2, RRR, no LE edema Pulmonary: LCTA, no increased work of breathing, no cough, room air Abdomen:  normo-active BS + 4 quadrants, soft and non tender GU: deferred MSK: moves all extremities, ambulatory Skin: warm and dry, no rashes or wounds on visible skin Neuro:  no generalized weakness, A & O x 3 Psych: non-anxious affect, pleasant Hem/lymph/immuno: no widespread bruising   Thank you for the opportunity to participate in the care of Rachel Torres.  The palliative care team will continue to follow. Please call our office at [phone] if we can be of additional assistance.   Rachel Slocumb, NP   COVID-19 PATIENT SCREENING TOOL Asked and negative response unless otherwise noted:   Have you had symptoms of covid, tested positive or been in contact with someone with symptoms/positive test in the past 5-10 days? No

## 2022-06-29 ENCOUNTER — Ambulatory Visit (INDEPENDENT_AMBULATORY_CARE_PROVIDER_SITE_OTHER): Payer: Medicare HMO | Admitting: Psychology

## 2022-06-29 DIAGNOSIS — F331 Major depressive disorder, recurrent, moderate: Secondary | ICD-10-CM | POA: Diagnosis not present

## 2022-06-29 DIAGNOSIS — M546 Pain in thoracic spine: Secondary | ICD-10-CM | POA: Diagnosis not present

## 2022-06-29 DIAGNOSIS — M9901 Segmental and somatic dysfunction of cervical region: Secondary | ICD-10-CM | POA: Diagnosis not present

## 2022-06-29 DIAGNOSIS — M9902 Segmental and somatic dysfunction of thoracic region: Secondary | ICD-10-CM | POA: Diagnosis not present

## 2022-06-29 DIAGNOSIS — M50322 Other cervical disc degeneration at C5-C6 level: Secondary | ICD-10-CM | POA: Diagnosis not present

## 2022-06-29 NOTE — Progress Notes (Signed)
[facility] Counselor/Therapist Progress Note  Patient ID: DENA ESPERANZA, MRN: [id number],    Date: 06/29/2022  Time Spent: 10:00am-10:55am    55 minutes   Treatment Type: Individual Therapy  Reported Symptoms: stress  Mental Status Exam: Appearance:  Casual     Behavior: Appropriate  Motor: Normal  Speech/Language:  Normal Rate  Affect: Appropriate  Mood: normal  Thought process: normal  Thought content:   WNL  Sensory/Perceptual disturbances:   WNL  Orientation: oriented to person, place, time/date, and situation  Attention: Good  Concentration: Good  Memory: WNL  Fund of knowledge:  Good  Insight:   Good  Judgment:  Good  Impulse Control: Good   Risk Assessment: Danger to Self:  No Self-injurious Behavior: No Danger to Others: No Duty to Warn:no Physical Aggression / Violence:No  Access to Firearms a concern: No  Gang Involvement:No   Subjective:  [name] present for face-to-face individual therapy via video Webex.  Pt consents to telehealth video session due to COVID 19 pandemic. Location of pt: home Location of therapist: home office.   Pt talked about caregiving for her husband [name].  Addressed the stress of caregiving.  Worked on stress management strategies.   Pt has some respite time scheduled this week which she is looking forward to and really needs.   Pt talked about not sleeping well.   She has tried the sleep strategies we talked about last session.  Identified that pt's worry and negative thoughts impact her sleep.   Worked on additional sleep strategies and thought reframing.     Worked on self care strategies.   Provided supportive therapy.  Interventions: Cognitive Behavioral Therapy and Insight-Oriented  Diagnosis: F33.1   Plan of Care: Recommend ongoing therapy.   Pt participated in setting treatment goals.   Pt needs a place to talk about stressors.  Plan to meet every two weeks.    Treatment Plan (Treatment Plan Target  Date: 03/23/2023) Client Abilities/Strengths  Pt is bright, engaging, and motivated for therapy.   Client Treatment Preferences  Individual therapy.  Client Statement of Needs  Improve coping skills.  Have a place to talk about stressors.   Symptoms  Depressed or irritable mood. Unresolved grief issues.  Problems Addressed  Unipolar Depression Goals 1. Alleviate depressive symptoms and return to previous level of effective functioning. 2. Appropriately grieve the loss in order to normalize mood and to return to previously adaptive level of functioning. Objective Learn and implement behavioral strategies to overcome depression. Target Date: 2023-03-23 Frequency: Biweekly  Progress: 10 Modality: individual  Related Interventions Engage the client in "behavioral activation," increasing his/her activity level and contact with sources of reward, while identifying processes that inhibit activation.  Use behavioral techniques such as instruction, rehearsal, role-playing, role reversal, as needed, to facilitate activity in the client's daily life; reinforce success. Assist the client in developing skills that increase the likelihood of deriving pleasure from behavioral activation (e.g., assertiveness skills, developing an exercise plan, less internal/more external focus, increased social involvement); reinforce success. Objective Identify important people in life, past and present, and describe the quality, good and poor, of those relationships. Target Date: 2023-03-23 Frequency: Biweekly  Progress: 10 Modality: individual  Related Interventions Conduct Interpersonal Therapy beginning with the assessment of the client's "interpersonal inventory" of important past and present relationships; develop a case formulation linking depression to grief, interpersonal role disputes, role transitions, and/or interpersonal deficits). Objective Learn and implement problem-solving and decision-making  skills. Target Date: 2023-03-23 Frequency:  Biweekly  Progress: 10 Modality: individual  Related Interventions Conduct Problem-Solving Therapy using techniques such as psychoeducation, modeling, and role-playing to teach client problem-solving skills (i.e., defining a problem specifically, generating possible solutions, evaluating the pros and cons of each solution, selecting and implementing a plan of action, evaluating the efficacy of the plan, accepting or revising the plan); role-play application of the problem-solving skill to a real life issue. Encourage in the client the development of a positive problem orientation in which problems and solving them are viewed as a natural part of life and not something to be feared, despaired, or avoided. 3. Develop healthy interpersonal relationships that lead to the alleviation and help prevent the relapse of depression. 4. Develop healthy thinking patterns and beliefs about self, others, and the world that lead to the alleviation and help prevent the relapse of depression. 5. Recognize, accept, and cope with feelings of depression. Diagnosis F33.1  Conditions For Discharge Achievement of treatment goals and objectives   [name], LCSW

## 2022-06-30 ENCOUNTER — Ambulatory Visit (INDEPENDENT_AMBULATORY_CARE_PROVIDER_SITE_OTHER): Payer: Medicare HMO | Admitting: Family Medicine

## 2022-06-30 ENCOUNTER — Encounter (INDEPENDENT_AMBULATORY_CARE_PROVIDER_SITE_OTHER): Payer: Self-pay

## 2022-07-06 DIAGNOSIS — M546 Pain in thoracic spine: Secondary | ICD-10-CM | POA: Diagnosis not present

## 2022-07-06 DIAGNOSIS — M50322 Other cervical disc degeneration at C5-C6 level: Secondary | ICD-10-CM | POA: Diagnosis not present

## 2022-07-06 DIAGNOSIS — M9901 Segmental and somatic dysfunction of cervical region: Secondary | ICD-10-CM | POA: Diagnosis not present

## 2022-07-06 DIAGNOSIS — M9902 Segmental and somatic dysfunction of thoracic region: Secondary | ICD-10-CM | POA: Diagnosis not present

## 2022-07-07 ENCOUNTER — Telehealth: Payer: Self-pay | Admitting: Student

## 2022-07-07 ENCOUNTER — Other Ambulatory Visit: Payer: TRICARE For Life (TFL) | Admitting: Student

## 2022-07-07 DIAGNOSIS — F418 Other specified anxiety disorders: Secondary | ICD-10-CM | POA: Diagnosis not present

## 2022-07-07 DIAGNOSIS — Z515 Encounter for palliative care: Secondary | ICD-10-CM

## 2022-07-07 NOTE — Progress Notes (Signed)
[facility] Consult Note Telephone: [phone]  Fax: [phone]    Date of encounter: 07/07/22  PATIENT NAME: [place] [id number]   [phone] (home)  DOB: [birth year] MRN: [id number] PRIMARY CARE PROVIDER:    [name], MD,  [place] [address] [phone]  REFERRING PROVIDER:   [name], MD [address] [place],  [place] [address] [phone]  RESPONSIBLE PARTY:    Contact Information     Name Relation Home Work Mobile   [place] Son   [phone]   [place], [place] [phone]  [phone]       Due to the COVID-19 crisis, this home visit was done via telephone due to the patient's inability to connect via an audiovisual connection or their refusal to have an in-person visit. This connection was agreed to by the patient. Verified that I am speaking with the correct person using two identifiers.                                   ASSESSMENT AND PLAN / RECOMMENDATIONS:   Advance Care Planning/Goals of Care: Goals include to maximize quality of life and symptom management. Patient/health care surrogate gave his/her permission to discuss.  CODE STATUS: Full Code  Education provided on Palliative Medicine; will continue to provide supportive care, symptom management as needed  Symptom Management/Plan:  Depression and Anxiety-patient with worsening anxiety and depression. She expresses feeling overwhelmed. She has missed a couple of appointments due to mixing up times. She is encouraged to keep calendar, set reminders. Recommend getting a respite stay for her husband, who she is primary caregiver for. Continue counseling services every other week. Recommend reaching out to PCP to have venlafaxine dosage increased. We discussed switching to hydroxyzine from her alprazolam as she reports only taking occasionally due to somnolence.  Will send script for hydroxyzine, start 12. 5 mg TID PRN.   Follow up Palliative Care Visit: Palliative care will continue to follow for complex medical decision making, advance care planning, and clarification of goals. Return in 6-8 weeks or prn.  I spent 20 minutes providing this consultation. More than 50% of the time in this consultation was spent in counseling and care coordination.   HOSPICE ELIGIBILITY/DIAGNOSIS: TBD  Chief Complaint: Worsening anxiety and depression  HISTORY OF PRESENT ILLNESS:  Rachel Torres is a 66 y.o. year old female  with depression with anxiety, combined CHF, paroxysmal atrial fibrillation, CAD, hypertension, hyperlipidemia, OA, osteoporosis, prediabetes, allergic rhinitis, GERD, IBS.    Patient reports worsening anxiety and depression, feeling overwhelmed. She states she has missed a couple of appointments due to putting down the wrong time. No thoughts of harming herself of others. She is experiencing anxiety with upcoming appointments her husband has. She does have caregiver for husband through [place], but is needing additional assistance. She is taking PRN alprazolam occasionally at night; states it makes her sleepy so she does not take during the day. A 10 point ROS is negative, except for the pertinent positives detailed per the HPI.   History obtained from review of EMR, discussion with primary team, and interview with family, facility staff/caregiver and/or Rachel Torres.  I reviewed available labs, medications, imaging, studies and related documents from the EMR.  Records reviewed and summarized above.    Physical Exam: PE deferred d/t this being a  telemedicine visit.   Thank you for the opportunity to participate in the care of Rachel Torres.  The palliative care team will continue to follow. Please call our office at [phone] if we can be of additional assistance.   Ezekiel Slocumb, NP   COVID-19 PATIENT SCREENING TOOL Asked and negative response  unless otherwise noted:   Have you had symptoms of covid, tested positive or been in contact with someone with symptoms/positive test in the past 5-10 days? No

## 2022-07-07 NOTE — Telephone Encounter (Signed)
Palliative NP returned call to patient; left message.

## 2022-07-08 ENCOUNTER — Encounter (INDEPENDENT_AMBULATORY_CARE_PROVIDER_SITE_OTHER): Payer: Self-pay | Admitting: Family Medicine

## 2022-07-08 ENCOUNTER — Ambulatory Visit (INDEPENDENT_AMBULATORY_CARE_PROVIDER_SITE_OTHER): Payer: Medicare HMO | Admitting: Family Medicine

## 2022-07-08 VITALS — BP 125/68 | HR 82 | Temp 97.9°F | Ht 65.0 in | Wt 178.0 lb

## 2022-07-08 DIAGNOSIS — F3289 Other specified depressive episodes: Secondary | ICD-10-CM

## 2022-07-08 DIAGNOSIS — E669 Obesity, unspecified: Secondary | ICD-10-CM

## 2022-07-08 DIAGNOSIS — Z6829 Body mass index (BMI) 29.0-29.9, adult: Secondary | ICD-10-CM

## 2022-07-08 DIAGNOSIS — Z6835 Body mass index (BMI) 35.0-35.9, adult: Secondary | ICD-10-CM

## 2022-07-12 ENCOUNTER — Other Ambulatory Visit: Payer: Self-pay | Admitting: Cardiovascular Disease

## 2022-07-13 ENCOUNTER — Ambulatory Visit (INDEPENDENT_AMBULATORY_CARE_PROVIDER_SITE_OTHER): Payer: Medicare HMO | Admitting: Psychology

## 2022-07-13 ENCOUNTER — Encounter: Payer: Self-pay | Admitting: Family Medicine

## 2022-07-13 ENCOUNTER — Telehealth (INDEPENDENT_AMBULATORY_CARE_PROVIDER_SITE_OTHER): Payer: Medicare HMO | Admitting: Family Medicine

## 2022-07-13 VITALS — Ht 65.0 in | Wt 178.0 lb

## 2022-07-13 DIAGNOSIS — F331 Major depressive disorder, recurrent, moderate: Secondary | ICD-10-CM | POA: Diagnosis not present

## 2022-07-13 DIAGNOSIS — F418 Other specified anxiety disorders: Secondary | ICD-10-CM

## 2022-07-13 NOTE — Progress Notes (Signed)
Virtual Visit via Video Note  I connected with Rachel Torres on 07/13/22 at 11:30 AM EDT by a video enabled telemedicine application and verified that I am speaking with the correct person using two identifiers.  Location: Patient: home Provider: office    I discussed the limitations of evaluation and management by telemedicine and the availability of in person appointments. The patient expressed understanding and agreed to proceed.  Parties involved in encounter  Patient: Rachel Torres  Provider:  Loura Pardon MD   History of Present Illness: Pt presents for medication management for depression   Husband has declined a lot cognitively (with parkinsons) No longer getting respite (was doing it from the [place])   Stress eater- put some weight back on  Less time for the gym - twice per week A little exercise at home /has equip but harder to motivate for that   She sees a counselor  [name] -still very helpful /seeing her every other week   Finds herself tearful a lot  Messing up appointments - this is not like her / getting to appt late  Is overwhelmed right now  Some disorganization   NP with home care sent in some hydroxyzine (has not picked up yet)  Plans ot try it  Sometimes she worries about sleeping through her husband getting up /worried he will fall    Depression with anxiety  Used to take effexor xr 150 mg then added another 37.5 mg daily   Prozac in the distant past   Saw psychiatry in the past  Could no longer afford to go   Stressors  Husb with parkinsons/ waiting to get disability through the [place], only gets 16 hours of in home care weekly  This could take a long time  She does not want to put in in a facility  In the hospital twice in 2 wk  Elderly mother   Not suicidal at all   Last psychiatrist was 12 years ago  Interested in new psychiatrist   Patient Active Problem List   Diagnosis Date Noted   Other constipation 05/12/2022   Medicare  annual wellness visit, initial 07/17/2021   TMJ (dislocation of temporomandibular joint) 07/17/2021   Encounter for screening mammogram for breast cancer 06/05/2021   Diarrhea 04/11/2020   Epigastric pain 02/19/2020   Dark stools 02/19/2020   PAF (paroxysmal atrial fibrillation) ([place]) 02/05/2019   Shortness of breath 01/27/2019   Irregular surface of cornea 12/25/2018   HPV (human papilloma virus) infection 12/01/2016   Screening mammogram, encounter for 11/29/2016   Estrogen deficiency 11/29/2016   Encounter for routine gynecological examination 11/29/2016   Grade III hemorrhoids 10/06/2016   Tachycardia 08/14/2016   Hemorrhoids 08/13/2016   Atypical migraine 08/03/2016   CAD (coronary artery disease)    AAA (abdominal aortic aneurysm) ([place])    Chronic combined systolic (congestive) and diastolic (congestive) heart failure ([place])    Prediabetes 02/11/2016   Generalized anxiety disorder 12/08/2015   Panic attacks 07/28/2015   Sensorineural hearing loss 03/27/2013   Vitamin D deficiency 07/03/2012   H/O aortic dissection 05/12/2012   Routine general medical examination at a health care facility 09/20/2011   S/P AVR (aortic valve replacement) 01/07/2011   CORONARY ARTERY BYPASS GRAFT, HX OF 01/07/2011   Arthritis 12/20/2010   Back pain 12/15/2010   H/O dissecting abdominal aortic aneurysm repair 08/24/2010   IRRITABLE BOWEL SYNDROME 09/09/2009   Obstructive sleep apnea 08/04/2009   External hemorrhoid 06/26/2009   Osteoporosis 01/09/2009  Hyperlipidemia 07/26/2007   Depression with anxiety 07/26/2007   Essential hypertension 07/26/2007   Allergic rhinitis 07/26/2007   Asthma 07/26/2007   GERD 07/26/2007   Osteoarthritis 07/26/2007   Ocular migraine 07/26/2007   Mild intermittent asthma without complication 32/67/1245   Past Medical History:  Diagnosis Date   AAA (abdominal aortic aneurysm) ([place])    a. Chronic w/o evidence of aneurysmal dil on CTA 01/2016.   Alcohol  abuse    Allergy    Anemia    Anxiety    Asthma    CAD (coronary artery disease)    a. 08/2010 s/p CABG x 1 (VG->RCA) @ time of Ao dissection repair; b. 01/2016 Lexiscan MV: mid antsept/apical defect w/ ? peri-infarct ischemia-->likely attenuation-->Med Rx.   Chewing difficulty    Chronic combined systolic (congestive) and diastolic (congestive) heart failure ([place])    a. 2012 EF 30-35%; b. 04/2015 EF 40-45%; c. 01/2016 Echo: Ef 55-65%, nl AoV bioprosthesis, nl RV, nl PASP.   Constipation    Depression    Edema of both lower extremities    Gallbladder sludge    GERD (gastroesophageal reflux disease)    Heart failure (HCC)    Heart valve problem    History of Bicuspid Aortic Valve    a. 08/2010 s/p AVR @ time of Ao dissection repair; b. 01/2016 Echo: Ef 55-65%, nl AoV bioprosthesis, nl RV, nl PASP.   Hx of repair of dissecting thoracic aortic aneurysm, [place] type A    a. 08/2010 s/p repair with AVR and VG->RCA; b. 01/2016 CTA: stable appearance of Asc Thoracic Aortic graft. Opacification of flase lumen of chronic abd Ao dissection w/ retrograde flow through lumbar arteries. No aneurysmal dil of flase lumen.   Hyperlipidemia    Hypertension    Hypertensive heart disease    IBS (irritable bowel syndrome)    Internal hemorrhoids    Kidney problem    Marfan syndrome    pt denies   Migraine    Obstructive sleep apnea    Osteoarthritis    Osteoporosis    Palpitations    Pneumonia    RA (rheumatoid arthritis) ([place])    a. Followed by Dr. [name]   Past Surgical History:  Procedure Laterality Date   ABDOMINAL AORTIC ANEURYSM REPAIR     CARDIAC CATHETERIZATION  2001   [place]     ESOPHAGOGASTRODUODENOSCOPY (EGD) WITH PROPOFOL N/A 05/08/2020   Procedure: ESOPHAGOGASTRODUODENOSCOPY (EGD) WITH PROPOFOL;  Surgeon: Virgel Manifold, MD;  Location: [facility] ENDOSCOPY;  Service: Endoscopy;  Laterality: N/A;   FRACTURE SURGERY Right     shoulder replacement   HEMORRHOID BANDING     ORIF DISTAL RADIUS FRACTURE Left    UPPER GASTROINTESTINAL ENDOSCOPY     Social History   Tobacco Use   Smoking status: Former    Packs/day: 0.75    Years: 1.50    Total pack years: 1.13    Types: Cigarettes    Quit date: 12/20/1978    Years since quitting: 43.5   Smokeless tobacco: Never  Vaping Use   Vaping Use: Never used  Substance Use Topics   Alcohol use: Yes    Alcohol/week: 0.0 standard drinks of alcohol    Comment: 2-3 glasses of red wine per week   Drug use: No   Family History  Problem Relation Age of Onset   Hypertension Mother    Depression Mother    Osteoporosis Mother  Stroke Mother    Irritable bowel syndrome Mother    Asthma Mother    Heart disease Mother    Thyroid disease Mother    Anxiety disorder Mother    Arthritis Sister    Diabetes Sister    Heart disease Sister    Asthma Sister    Migraines Sister    Nephrolithiasis Sister    Osteoporosis Sister    Arthritis Sister    Asthma Sister    Migraines Sister    Other Father 34   Stroke Maternal Grandmother    Colon cancer Maternal Grandmother    Lymphoma Maternal Grandmother    Migraines Maternal Grandmother    Aneurysm Paternal Grandmother        brain   Diabetes Paternal Grandmother    Arthritis Paternal Grandmother    Arthritis Paternal Grandfather    Diabetes Paternal Grandfather    Other Brother        prediabeties   Asthma Brother    Migraines Brother    Lung cancer Maternal Grandfather    Melanoma Maternal Grandfather    Allergies  Allergen Reactions   Fosamax [Alendronate Sodium]     GI upset    Ginzing [Ginseng] Other (See Comments)    Hyper and jitery    Hydrocod Poli-Chlorphe Poli Er Itching   Hydrocodone-Acetaminophen Itching   Imitrex [Sumatriptan]     Contraindicated w/ heart condition   Oxycodone Itching   Peanut-Containing Drug Products Other (See Comments)    Reaction:  Migraines    Tramadol Other (See  Comments)    itching   Hydrocodone-Guaifenesin Itching   Prednisone Rash   Tape Itching   Current Outpatient Medications on File Prior to Visit  Medication Sig Dispense Refill   acetaminophen (TYLENOL) 500 MG tablet Take 500 mg by mouth every 6 (six) hours as needed.     albuterol (VENTOLIN HFA) 108 (90 Base) MCG/ACT inhaler Inhale 1-2 puffs into the lungs every 6 (six) hours as needed for wheezing or shortness of breath. 3 each 3   ALPRAZolam (XANAX) 0.5 MG tablet Take 1 tablet (0.5 mg total) by mouth daily as needed for anxiety. 30 tablet 0   aspirin EC 81 MG tablet Take 81 mg by mouth at bedtime.     atorvastatin (LIPITOR) 20 MG tablet Take 1 tablet (20 mg total) by mouth daily. (Patient taking differently: Take 20 mg by mouth. FOUR TIMES A WEEK) 90 tablet 3   carvedilol (COREG) 6.25 MG tablet TAKE 1 TABLET TWICE DAILY WITH A MEAL 180 tablet 3   cetirizine (ZYRTEC) 10 MG tablet Take 1 tablet (10 mg total) by mouth daily. 90 tablet 3   Cholecalciferol (VITAMIN D3) 50 MCG (2000 UT) CAPS Take 2,000 capsules by mouth. FOUR TIMES A WEEK     clindamycin (CLEOCIN) 300 MG capsule 300 mg. AS NEEDED FOR DENTAL PROCEDURES     cyclobenzaprine (FLEXERIL) 10 MG tablet Take 0.5-1 tablets (5-10 mg total) by mouth 3 (three) times daily as needed. For headache 90 tablet 3   ezetimibe (ZETIA) 10 MG tablet Take 1 tablet (10 mg total) by mouth daily. 90 tablet 3   fluticasone (FLONASE) 50 MCG/ACT nasal spray Place 2 sprays into both nostrils daily. 48 g 3   furosemide (LASIX) 20 MG tablet Take 1 tablet (20 mg total) by mouth every other day. And as needed for swelling or shortness of breath (Patient taking differently: Take 20 mg by mouth. 4 times a week) 55 tablet 3  Insulin Pen Needle (BD PEN NEEDLE NANO U/F) 32G X 4 MM MISC Use Nano needle with Ozempic 100 each 0   Krill Oil (OMEGA-3) 500 MG CAPS Take 1 capsule by mouth daily.     lisinopril (ZESTRIL) 5 MG tablet TAKE 1/2 TABLET EVERY DAY. (Patient taking  differently: TAKE 1/2 TABLET 4x a week) 45 tablet 3   magnesium gluconate (MAGONATE) 500 MG tablet Take 500 mg by mouth at bedtime.     Melatonin 5 MG SUBL Place under the tongue at bedtime as needed.     Misc Natural Products (TART CHERRY ADVANCED) CAPS Take 1 capsule by mouth daily.     naproxen sodium (ANAPROX) 220 MG tablet Take 440 mg by mouth 2 (two) times daily as needed (for headaches/pain).     nitroGLYCERIN (NITROSTAT) 0.4 MG SL tablet Place 1 tablet (0.4 mg total) under the tongue every 5 (five) minutes as needed for chest pain. 25 tablet 3   omeprazole (PRILOSEC) 40 MG capsule TAKE 1 CAPSULE EVERY DAY 90 capsule 3   polyethylene glycol powder (GLYCOLAX/MIRALAX) 17 GM/SCOOP powder Take 17 g by mouth as needed. 255 g 0   potassium chloride (KLOR-CON M) 10 MEQ tablet TAKE 1 TABLET (10 MEQ TOTAL) BY MOUTH EVERY OTHER DAY. TAKE WITH LASIX 45 tablet 3   promethazine (PHENERGAN) 25 MG tablet Take 0.5 tablets (12.5 mg total) by mouth every 8 (eight) hours as needed for nausea. 90 tablet 3   Simethicone (MYLANTA GAS PO) Take by mouth 3 times/day as needed-between meals & bedtime.     venlafaxine XR (EFFEXOR-XR) 150 MG 24 hr capsule TAKE 1 CAPSULE EVERY DAY WITH BREAKFAST 7 capsule 0   venlafaxine XR (EFFEXOR-XR) 37.5 MG 24 hr capsule TAKE 1 CAPSULE (37.5 MG TOTAL) BY MOUTH DAILY WITH BREAKFAST. 90 capsule 2   No current facility-administered medications on file prior to visit.   Review of Systems  Constitutional:  Positive for malaise/fatigue. Negative for chills and fever.  HENT:  Negative for congestion, ear pain, sinus pain and sore throat.   Eyes:  Negative for blurred vision, discharge and redness.  Respiratory:  Negative for cough, shortness of breath and stridor.   Cardiovascular:  Negative for chest pain, palpitations and leg swelling.  Gastrointestinal:  Negative for abdominal pain, diarrhea, nausea and vomiting.  Musculoskeletal:  Negative for myalgias.  Skin:  Negative for rash.   Neurological:  Negative for dizziness and headaches.  Psychiatric/Behavioral:  Positive for depression. Negative for hallucinations, memory loss, substance abuse and suicidal ideas. The patient is nervous/anxious and has insomnia.       Observations/Objective: Patient appears well, in no distress Weight is baseline  No facial swelling or asymmetry Normal voice-not hoarse and no slurred speech No obvious tremor or mobility impairment Moving neck and UEs normally Able to hear the call well  No cough or shortness of breath during interview  Talkative and mentally sharp with no cognitive changes No skin changes on face or neck , no rash or pallor Mood is sad today/occ tearful and mildly anxious  Good insight Candidly discusses symptoms and stressors     Assessment and Plan:  Problem List Items Addressed This Visit       Other   Depression with anxiety - Primary    Worse lately due to severe stressor (caring for husb with Parkinson's and no help)  Reviewed stressors/ coping techniques/symptoms/ support sources/ tx options and side effects in detail today Seeing counselor-will continue  Taking effexor 187.5  mg daily -has worked in past  Left her psychiatrist 72 y ago  Would like to investigate a new one  Will look into options for her area to call  Enc to continue counseling and self care as time allows        Follow Up Instructions: Try and take care of yourself  Try the hydroxyzine for anxiety and sleep/use caution for sedation  Continue current medications   Here are some numbers to call to inquire about psychiatrist availability If you need a referral let us know   [Thursday 11:05 [place], [place] - External  Address: [address] [place], [place] [address]  Phone: [phone]     [facility] ([place]) - Internal  Address: [address], [place], [place] [address]  Phone: [phone]     [place], [place], [facility], [facility] (psych evals) - External  Address: [address] [place], [place] [address]  Phone: [phone]     [facility]) - External  Address: [address] [place], [place], [place] [address]  Phone: [phone]   I discussed the assessment and treatment plan with the patient. The patient was provided an opportunity to ask questions and all were answered. The patient agreed with the plan and demonstrated an understanding of the instructions.   The patient was advised to call back or seek an in-person evaluation if the symptoms worsen or if the condition fails to improve as anticipated.     Loura Pardon, MD

## 2022-07-13 NOTE — Patient Instructions (Addendum)
Try and take care of yourself  Try the hydroxyzine for anxiety and sleep/use caution for sedation  Continue current medications   Here are some numbers to call to inquire about psychiatrist availability If you need a referral let us know   [Thursday 11:05 [place], [place] - External  Address: [address], [place], [place] [address]  Phone: [phone]     [facility] ([place]) - Internal  Address: [address], [place], [place] [address]  Phone: [phone]     [place], [place], [facility], [facility] (psych evals) - External  Address: [address] [place], [place] [address]  Phone: [phone]     [facility]) - External  Address: [address] [place], [place], [place] [address]  Phone: [phone]

## 2022-07-13 NOTE — Assessment & Plan Note (Signed)
Worse lately due to severe stressor (caring for husb with Parkinson's and no help)  Reviewed stressors/ coping techniques/symptoms/ support sources/ tx options and side effects in detail today Seeing counselor-will continue  Taking effexor 187.5 mg daily -has worked in past  Left her psychiatrist 18 y ago  Would like to investigate a new one  Will look into options for her area to call  Enc to continue counseling and self care as time allows

## 2022-07-13 NOTE — Progress Notes (Signed)
[facility] Counselor/Therapist Progress Note  Patient ID: Rachel Torres, MRN: [id number],    Date: 07/13/2022  Time Spent: 10:00am-10:55am    55 minutes   Treatment Type: Individual Therapy  Reported Symptoms: stress  Mental Status Exam: Appearance:  Casual     Behavior: Appropriate  Motor: Normal  Speech/Language:  Normal Rate  Affect: Appropriate  Mood: normal  Thought process: normal  Thought content:   WNL  Sensory/Perceptual disturbances:   WNL  Orientation: oriented to person, place, time/date, and situation  Attention: Good  Concentration: Good  Memory: WNL  Fund of knowledge:  Good  Insight:   Good  Judgment:  Good  Impulse Control: Good   Risk Assessment: Danger to Self:  No Self-injurious Behavior: No Danger to Others: No Duty to Warn:no Physical Aggression / Violence:No  Access to Firearms a concern: No  Gang Involvement:No   Subjective:  [name] present for face-to-face individual therapy via video Webex.  Pt consents to telehealth video session due to COVID 19 pandemic. Location of pt: home Location of therapist: home office.   Pt talked about having a difficult couple of weeks.   She has gotten hers and Rachel Torres's doctors appointments mixed up the past couple of weeks.   Pt has been feeling down and has an appointment with her PCP to request to increase her Effexor dosage.   Pt was tearful as she talked about not feeling in control of anything.   She feels the weight of all of her responsibilities and caregiving for Rachel Torres.   Pt states Rachel Torres's cognitive decline has gotten worse.  Helped pt process her feelings.   Addressed how pt can get some respite.  She has a respite day planned for this Friday.   Pt tends to worry a lot and has many "what if" thoughts.  Addressed pt's worry thoughts and worked worry management and thought reframing.   Worked on self care strategies.   Provided supportive therapy.  Interventions: Cognitive Behavioral  Therapy and Insight-Oriented  Diagnosis: F33.1   Plan of Care: Recommend ongoing therapy.   Pt participated in setting treatment goals.   Pt needs a place to talk about stressors.  Plan to meet every two weeks.    Treatment Plan (Treatment Plan Target Date: 03/23/2023) Client Abilities/Strengths  Pt is bright, engaging, and motivated for therapy.   Client Treatment Preferences  Individual therapy.  Client Statement of Needs  Improve coping skills.  Have a place to talk about stressors.   Symptoms  Depressed or irritable mood. Unresolved grief issues.  Problems Addressed  Unipolar Depression Goals 1. Alleviate depressive symptoms and return to previous level of effective functioning. 2. Appropriately grieve the loss in order to normalize mood and to return to previously adaptive level of functioning. Objective Learn and implement behavioral strategies to overcome depression. Target Date: 2023-03-23 Frequency: Biweekly  Progress: 10 Modality: individual  Related Interventions Engage the client in "behavioral activation," increasing his/her activity level and contact with sources of reward, while identifying processes that inhibit activation.  Use behavioral techniques such as instruction, rehearsal, role-playing, role reversal, as needed, to facilitate activity in the client's daily life; reinforce success. Assist the client in developing skills that increase the likelihood of deriving pleasure from behavioral activation (e.g., assertiveness skills, developing an exercise plan, less internal/more external focus, increased social involvement); reinforce success. Objective Identify important people in life, past and present, and describe the quality, good and poor, of those relationships. Target Date: 2023-03-23 Frequency: Biweekly  Progress: 10 Modality: individual  Related Interventions Conduct Interpersonal Therapy beginning with the assessment of the client's "interpersonal  inventory" of important past and present relationships; develop a case formulation linking depression to grief, interpersonal role disputes, role transitions, and/or interpersonal deficits). Objective Learn and implement problem-solving and decision-making skills. Target Date: 2023-03-23 Frequency: Biweekly  Progress: 10 Modality: individual  Related Interventions Conduct Problem-Solving Therapy using techniques such as psychoeducation, modeling, and role-playing to teach client problem-solving skills (i.e., defining a problem specifically, generating possible solutions, evaluating the pros and cons of each solution, selecting and implementing a plan of action, evaluating the efficacy of the plan, accepting or revising the plan); role-play application of the problem-solving skill to a real life issue. Encourage in the client the development of a positive problem orientation in which problems and solving them are viewed as a natural part of life and not something to be feared, despaired, or avoided. 3. Develop healthy interpersonal relationships that lead to the alleviation and help prevent the relapse of depression. 4. Develop healthy thinking patterns and beliefs about self, others, and the world that lead to the alleviation and help prevent the relapse of depression. 5. Recognize, accept, and cope with feelings of depression. Diagnosis F33.1  Conditions For Discharge Achievement of treatment goals and objectives   [name], LCSW

## 2022-07-13 NOTE — Progress Notes (Signed)
   Subjective:    Patient ID: Rachel Torres, female    DOB: 1956/07/28, 66 y.o.   MRN: [id number]  HPI Pt presents for medication management for depression   Husband has declined a lot cognitively (with parkinsons) No longer getting respite (was doing it from the [place])   Stress eater- put some weight back on  Less time for the gym - twice per week A little exercise at home /has equip but harder to motivate for that   She sees a counselor  [name] -still very helpful /seeing her every other week   Finds herself tearful a lot  Messing up appointments - this is not like her / getting to appt late  Is overwhelmed right now  Some disorganization   NP with home care sent in some hydroxyzine (has not picked up yet)  Plans ot try it  Sometimes she worries about sleeping through her husband getting up /worried he will fall    Depression with anxiety  Used to take effexor xr 150 mg then added another 37.5 mg daily   Prozac in the distant past   Saw psychiatry in the past  Could no longer afford to go   Stressors  Husb with parkinsons/ waiting to get disability through the [place], only gets 16 hours of in home care weekly  This could take a long time  She does not want to put in in a facility  In the hospital twice in 2 wk  Elderly mother   Not suicidal at all   Last psychiatrist was 12 years ago  Interested in new psychiatrist      Review of Systems     Objective:   Physical Exam        Assessment & Plan:

## 2022-07-14 NOTE — Progress Notes (Unsigned)
Chief Complaint:   OBESITY Trinna is here to discuss her progress with her obesity treatment plan along with follow-up of her obesity related diagnoses. Vernida is on practicing portion control and making smarter food choices, such as increasing vegetables and decreasing simple carbohydrates and states she is following her eating plan approximately 25% of the time. Pattie states she is doing various gym exercises for 45 minutes 3 times per week.  Today's visit was #: 50 Starting weight: 213 lbs Starting date: 05/05/2020 Today's weight: 178 lbs Today's date: 07/08/2022 Total lbs lost to date: 35 Total lbs lost since last in-office visit: 0  Interim History: Brezlyn has had a lot of stress with her husband's worsening health and hospitalizations.  She has not been able to watch her eating as much and she has gained some weight.  She thinks she gained more but she has started to lose it again.  Subjective:   1. Other depression, emotional eating binges Tiaja has had a lot of stress and she has done more stress eating.  She feels she could benefit from talking with Dr. Mallie Mussel again.  Assessment/Plan:   1. Other depression, emotional eating binges Elisabella was referred to Dr. Mallie Mussel, our bariatric psychologist to help with controlling emotional eating behaviors.  Emotional eating behavior strategies were discussed with the patient today.  2. Obesity, Current BMI 29.7 Jennifermarie is currently in the action stage of change. As such, her goal is to continue with weight loss efforts. She has agreed to practicing portion control and making smarter food choices, such as increasing vegetables and decreasing simple carbohydrates.   Exercise goals: As is.   Behavioral modification strategies: increasing vegetables and emotional eating strategies.  Babita has agreed to follow-up with our clinic in 2 to 3 weeks. She was informed of the importance of frequent follow-up visits to maximize her  success with intensive lifestyle modifications for her multiple health conditions.   Objective:   Blood pressure 125/68, pulse 82, temperature 97.9 F (36.6 C), height '5\' 5"'$  (1.651 m), weight 178 lb (80.7 kg), SpO2 98 %. Body mass index is 29.62 kg/m.  General: Cooperative, alert, well developed, in no acute distress. HEENT: Conjunctivae and lids unremarkable. Cardiovascular: Regular rhythm.  Lungs: Normal work of breathing. Neurologic: No focal deficits.   Lab Results  Component Value Date   CREATININE 0.82 03/17/2022   BUN 12 03/17/2022   NA 136 03/17/2022   K 4.7 03/17/2022   CL 102 03/17/2022   CO2 27 03/17/2022   Lab Results  Component Value Date   ALT 16 12/03/2021   AST 18 12/03/2021   ALKPHOS 68 12/03/2021   BILITOT 0.5 12/03/2021   Lab Results  Component Value Date   HGBA1C 5.5 03/17/2022   HGBA1C 5.5 12/03/2021   HGBA1C 5.6 07/10/2021   HGBA1C 5.5 02/26/2021   HGBA1C 5.7 (H) 10/29/2020   Lab Results  Component Value Date   INSULIN 4.5 12/03/2021   INSULIN 17.3 02/26/2021   INSULIN 13.9 10/29/2020   INSULIN 22.5 07/23/2020   INSULIN 25.2 (H) 05/05/2020   Lab Results  Component Value Date   TSH 1.54 07/10/2021   Lab Results  Component Value Date   CHOL 182 03/17/2022   HDL 63.30 03/17/2022   LDLCALC 105 (H) 03/17/2022   LDLDIRECT 189.4 10/13/2011   TRIG 70.0 03/17/2022   CHOLHDL 3 03/17/2022   Lab Results  Component Value Date   VD25OH 57.7 12/03/2021   VD25OH 74.87 07/10/2021  VD25OH 62.2 02/26/2021   Lab Results  Component Value Date   WBC 5.6 07/10/2021   HGB 13.9 07/10/2021   HCT 41.3 07/10/2021   MCV 96.2 07/10/2021   PLT 274.0 07/10/2021   Lab Results  Component Value Date   FERRITIN 105.0 12/11/2010   Attestation Statements:   Reviewed by clinician on day of visit: allergies, medications, problem list, medical history, surgical history, family history, social history, and previous encounter notes.   I, Trixie Dredge,  am acting as transcriptionist for [name], MD.  I have reviewed the above documentation for accuracy and completeness, and I agree with the above. -  [name], MD

## 2022-07-23 DIAGNOSIS — M50322 Other cervical disc degeneration at C5-C6 level: Secondary | ICD-10-CM | POA: Diagnosis not present

## 2022-07-23 DIAGNOSIS — M9902 Segmental and somatic dysfunction of thoracic region: Secondary | ICD-10-CM | POA: Diagnosis not present

## 2022-07-23 DIAGNOSIS — M546 Pain in thoracic spine: Secondary | ICD-10-CM | POA: Diagnosis not present

## 2022-07-23 DIAGNOSIS — M9901 Segmental and somatic dysfunction of cervical region: Secondary | ICD-10-CM | POA: Diagnosis not present

## 2022-07-27 ENCOUNTER — Ambulatory Visit (INDEPENDENT_AMBULATORY_CARE_PROVIDER_SITE_OTHER): Payer: Medicare HMO | Admitting: Psychology

## 2022-07-27 DIAGNOSIS — F331 Major depressive disorder, recurrent, moderate: Secondary | ICD-10-CM

## 2022-07-27 DIAGNOSIS — M9901 Segmental and somatic dysfunction of cervical region: Secondary | ICD-10-CM | POA: Diagnosis not present

## 2022-07-27 DIAGNOSIS — M546 Pain in thoracic spine: Secondary | ICD-10-CM | POA: Diagnosis not present

## 2022-07-27 DIAGNOSIS — M50322 Other cervical disc degeneration at C5-C6 level: Secondary | ICD-10-CM | POA: Diagnosis not present

## 2022-07-27 DIAGNOSIS — M9902 Segmental and somatic dysfunction of thoracic region: Secondary | ICD-10-CM | POA: Diagnosis not present

## 2022-07-27 NOTE — Progress Notes (Signed)
[facility] Counselor/Therapist Progress Note  Patient ID: JALINE PINCOCK, MRN: [id number],    Date: 07/27/2022  Time Spent: 10:00am-10:55am    55 minutes   Treatment Type: Individual Therapy  Reported Symptoms: stress  Mental Status Exam: Appearance:  Casual     Behavior: Appropriate  Motor: Normal  Speech/Language:  Normal Rate  Affect: Appropriate  Mood: normal  Thought process: normal  Thought content:   WNL  Sensory/Perceptual disturbances:   WNL  Orientation: oriented to person, place, time/date, and situation  Attention: Good  Concentration: Good  Memory: WNL  Fund of knowledge:  Good  Insight:   Good  Judgment:  Good  Impulse Control: Good   Risk Assessment: Danger to Self:  No Self-injurious Behavior: No Danger to Others: No Duty to Warn:no Physical Aggression / Violence:No  Access to Firearms a concern: No  Gang Involvement:No   Subjective:  [name] present for face-to-face individual therapy via video Webex.  Pt consents to telehealth video session due to COVID 19 pandemic. Location of pt: home Location of therapist: home office.   Pt talked about caregiving for [name].  She is taking him for a cognitive assessment this week.  Pt states the caregiving is getting harder.  Helped pt process her feelings.   Addressed how pt can get some respite.  She has started a parkinson's caregiver group through the [place].  Pt tends to worry a lot and has many "what if" thoughts.  Addressed pt's worry thoughts and worked worry management and thought reframing.   Worked on self care strategies.   Provided supportive therapy.  Interventions: Cognitive Behavioral Therapy and Insight-Oriented  Diagnosis: F33.1   Plan of Care: Recommend ongoing therapy.   Pt participated in setting treatment goals.   Pt needs a place to talk about stressors.  Plan to meet every two weeks.    Treatment Plan (Treatment Plan Target Date: 03/23/2023) Client Abilities/Strengths   Pt is bright, engaging, and motivated for therapy.   Client Treatment Preferences  Individual therapy.  Client Statement of Needs  Improve coping skills.  Have a place to talk about stressors.   Symptoms  Depressed or irritable mood. Unresolved grief issues.  Problems Addressed  Unipolar Depression Goals 1. Alleviate depressive symptoms and return to previous level of effective functioning. 2. Appropriately grieve the loss in order to normalize mood and to return to previously adaptive level of functioning. Objective Learn and implement behavioral strategies to overcome depression. Target Date: 2023-03-23 Frequency: Biweekly  Progress: 10 Modality: individual  Related Interventions Engage the client in "behavioral activation," increasing his/her activity level and contact with sources of reward, while identifying processes that inhibit activation.  Use behavioral techniques such as instruction, rehearsal, role-playing, role reversal, as needed, to facilitate activity in the client's daily life; reinforce success. Assist the client in developing skills that increase the likelihood of deriving pleasure from behavioral activation (e.g., assertiveness skills, developing an exercise plan, less internal/more external focus, increased social involvement); reinforce success. Objective Identify important people in life, past and present, and describe the quality, good and poor, of those relationships. Target Date: 2023-03-23 Frequency: Biweekly  Progress: 10 Modality: individual  Related Interventions Conduct Interpersonal Therapy beginning with the assessment of the client's "interpersonal inventory" of important past and present relationships; develop a case formulation linking depression to grief, interpersonal role disputes, role transitions, and/or interpersonal deficits). Objective Learn and implement problem-solving and decision-making skills. Target Date: 2023-03-23 Frequency: Biweekly   Progress: 10 Modality: individual  Related Interventions Conduct Problem-Solving Therapy using techniques such as psychoeducation, modeling, and role-playing to teach client problem-solving skills (i.e., defining a problem specifically, generating possible solutions, evaluating the pros and cons of each solution, selecting and implementing a plan of action, evaluating the efficacy of the plan, accepting or revising the plan); role-play application of the problem-solving skill to a real life issue. Encourage in the client the development of a positive problem orientation in which problems and solving them are viewed as a natural part of life and not something to be feared, despaired, or avoided. 3. Develop healthy interpersonal relationships that lead to the alleviation and help prevent the relapse of depression. 4. Develop healthy thinking patterns and beliefs about self, others, and the world that lead to the alleviation and help prevent the relapse of depression. 5. Recognize, accept, and cope with feelings of depression. Diagnosis F33.1  Conditions For Discharge Achievement of treatment goals and objectives   [name], LCSW

## 2022-07-28 ENCOUNTER — Encounter (INDEPENDENT_AMBULATORY_CARE_PROVIDER_SITE_OTHER): Payer: Self-pay

## 2022-08-08 ENCOUNTER — Encounter (INDEPENDENT_AMBULATORY_CARE_PROVIDER_SITE_OTHER): Payer: Self-pay | Admitting: Family Medicine

## 2022-08-09 NOTE — Telephone Encounter (Signed)
Patients appt has been cancelled scheduled.

## 2022-08-10 ENCOUNTER — Ambulatory Visit (INDEPENDENT_AMBULATORY_CARE_PROVIDER_SITE_OTHER): Payer: Medicare HMO | Admitting: Family Medicine

## 2022-08-10 ENCOUNTER — Ambulatory Visit (INDEPENDENT_AMBULATORY_CARE_PROVIDER_SITE_OTHER): Payer: Medicare HMO | Admitting: Psychology

## 2022-08-10 DIAGNOSIS — F331 Major depressive disorder, recurrent, moderate: Secondary | ICD-10-CM | POA: Diagnosis not present

## 2022-08-10 NOTE — Progress Notes (Signed)
[facility] Counselor/Therapist Progress Note  Patient ID: Rachel Torres, MRN: [id number],    Date: 08/10/2022  Time Spent: 10:00am-10:55am    55 minutes   Treatment Type: Individual Therapy  Reported Symptoms: stress  Mental Status Exam: Appearance:  Casual     Behavior: Appropriate  Motor: Normal  Speech/Language:  Normal Rate  Affect: Appropriate  Mood: normal  Thought process: normal  Thought content:   WNL  Sensory/Perceptual disturbances:   WNL  Orientation: oriented to person, place, time/date, and situation  Attention: Good  Concentration: Good  Memory: WNL  Fund of knowledge:  Good  Insight:   Good  Judgment:  Good  Impulse Control: Good   Risk Assessment: Danger to Self:  No Self-injurious Behavior: No Danger to Others: No Duty to Warn:no Physical Aggression / Violence:No  Access to Firearms a concern: No  Gang Involvement:No   Subjective:  [name] present for face-to-face individual therapy via video Webex.  Pt consents to telehealth video session due to COVID 19 pandemic. Location of pt: home Location of therapist: home office.   Pt talked about [name].  He has been in the hospital since last week and will probably need to be in there for awhile.  He has pancreatitis.  [name] has been having hallucinations.   [name] is going to need rehab.  Pt was tearful as she talked about her worries about [name].   Pt states the caregiving is getting harder.  Helped pt process her feelings.   Addressed how pt can get some respite.    Pt has not been sleeping well bc she has been worrying.   Pt tends to worry a lot and has many "what if" thoughts.  Addressed pt's worry thoughts and worked worry management and thought reframing.   Worked on self care strategies.   Provided supportive therapy.  Interventions: Cognitive Behavioral Therapy and Insight-Oriented  Diagnosis: F33.1   Plan of Care: Recommend ongoing therapy.   Pt participated in setting  treatment goals.   Pt needs a place to talk about stressors.  Plan to meet every two weeks.    Treatment Plan (Treatment Plan Target Date: 03/23/2023) Client Abilities/Strengths  Pt is bright, engaging, and motivated for therapy.   Client Treatment Preferences  Individual therapy.  Client Statement of Needs  Improve coping skills.  Have a place to talk about stressors.   Symptoms  Depressed or irritable mood. Unresolved grief issues.  Problems Addressed  Unipolar Depression Goals 1. Alleviate depressive symptoms and return to previous level of effective functioning. 2. Appropriately grieve the loss in order to normalize mood and to return to previously adaptive level of functioning. Objective Learn and implement behavioral strategies to overcome depression. Target Date: 2023-03-23 Frequency: Biweekly  Progress: 10 Modality: individual  Related Interventions Engage the client in "behavioral activation," increasing his/her activity level and contact with sources of reward, while identifying processes that inhibit activation.  Use behavioral techniques such as instruction, rehearsal, role-playing, role reversal, as needed, to facilitate activity in the client's daily life; reinforce success. Assist the client in developing skills that increase the likelihood of deriving pleasure from behavioral activation (e.g., assertiveness skills, developing an exercise plan, less internal/more external focus, increased social involvement); reinforce success. Objective Identify important people in life, past and present, and describe the quality, good and poor, of those relationships. Target Date: 2023-03-23 Frequency: Biweekly  Progress: 10 Modality: individual  Related Interventions Conduct Interpersonal Therapy beginning with the assessment of the client's "interpersonal inventory"  of important past and present relationships; develop a case formulation linking depression to grief, interpersonal role  disputes, role transitions, and/or interpersonal deficits). Objective Learn and implement problem-solving and decision-making skills. Target Date: 2023-03-23 Frequency: Biweekly  Progress: 10 Modality: individual  Related Interventions Conduct Problem-Solving Therapy using techniques such as psychoeducation, modeling, and role-playing to teach client problem-solving skills (i.e., defining a problem specifically, generating possible solutions, evaluating the pros and cons of each solution, selecting and implementing a plan of action, evaluating the efficacy of the plan, accepting or revising the plan); role-play application of the problem-solving skill to a real life issue. Encourage in the client the development of a positive problem orientation in which problems and solving them are viewed as a natural part of life and not something to be feared, despaired, or avoided. 3. Develop healthy interpersonal relationships that lead to the alleviation and help prevent the relapse of depression. 4. Develop healthy thinking patterns and beliefs about self, others, and the world that lead to the alleviation and help prevent the relapse of depression. 5. Recognize, accept, and cope with feelings of depression. Diagnosis F33.1  Conditions For Discharge Achievement of treatment goals and objectives   [name], LCSW

## 2022-08-22 ENCOUNTER — Other Ambulatory Visit: Payer: Self-pay | Admitting: Cardiovascular Disease

## 2022-08-24 ENCOUNTER — Ambulatory Visit (INDEPENDENT_AMBULATORY_CARE_PROVIDER_SITE_OTHER): Payer: Medicare HMO | Admitting: Psychology

## 2022-08-24 DIAGNOSIS — F331 Major depressive disorder, recurrent, moderate: Secondary | ICD-10-CM | POA: Diagnosis not present

## 2022-08-24 NOTE — Progress Notes (Signed)
[facility] Counselor/Therapist Progress Note  Patient ID: ARDEAN SIMONICH, MRN: [id number],    Date: 08/24/2022  Time Spent: 10:00am-10:55am    55 minutes   Treatment Type: Individual Therapy  Reported Symptoms: stress  Mental Status Exam: Appearance:  Casual     Behavior: Appropriate  Motor: Normal  Speech/Language:  Normal Rate  Affect: Appropriate  Mood: normal  Thought process: normal  Thought content:   WNL  Sensory/Perceptual disturbances:   WNL  Orientation: oriented to person, place, time/date, and situation  Attention: Good  Concentration: Good  Memory: WNL  Fund of knowledge:  Good  Insight:   Good  Judgment:  Good  Impulse Control: Good   Risk Assessment: Danger to Self:  No Self-injurious Behavior: No Danger to Others: No Duty to Warn:no Physical Aggression / Violence:No  Access to Firearms a concern: No  Gang Involvement:No   Subjective:  [name] present for face-to-face individual therapy via video Webex.  Pt consents to telehealth video session due to COVID 19 pandemic. Location of pt: home Location of therapist: home office.   Pt talked about [name].  He is home from the hospital.  Pt states she has several professionals telling her that [name] needs to be in a facility.  [name] is getting more confused and needs round the clock care.   Pt was hoping to be able to keep [place] home but she may start looking into facilities for him.  Pt states she has been crying a lot bc she knows [name] does not want to go to a facility.   Helped pt process her feelings.   Addressed how pt can get some respite.    Pt has not been sleeping well bc she has been worrying.   Pt tends to worry a lot and has many "what if" thoughts.  Addressed pt's worry thoughts and worked worry management and thought reframing.   Worked on self care strategies.   Provided supportive therapy.  Interventions: Cognitive Behavioral Therapy and Insight-Oriented  Diagnosis:  F33.1   Plan of Care: Recommend ongoing therapy.   Pt participated in setting treatment goals.   Pt needs a place to talk about stressors.  Plan to meet every two weeks.    Treatment Plan (Treatment Plan Target Date: 03/23/2023) Client Abilities/Strengths  Pt is bright, engaging, and motivated for therapy.   Client Treatment Preferences  Individual therapy.  Client Statement of Needs  Improve coping skills.  Have a place to talk about stressors.   Symptoms  Depressed or irritable mood. Unresolved grief issues.  Problems Addressed  Unipolar Depression Goals 1. Alleviate depressive symptoms and return to previous level of effective functioning. 2. Appropriately grieve the loss in order to normalize mood and to return to previously adaptive level of functioning. Objective Learn and implement behavioral strategies to overcome depression. Target Date: 2023-03-23 Frequency: Biweekly  Progress: 10 Modality: individual  Related Interventions Engage the client in "behavioral activation," increasing his/her activity level and contact with sources of reward, while identifying processes that inhibit activation.  Use behavioral techniques such as instruction, rehearsal, role-playing, role reversal, as needed, to facilitate activity in the client's daily life; reinforce success. Assist the client in developing skills that increase the likelihood of deriving pleasure from behavioral activation (e.g., assertiveness skills, developing an exercise plan, less internal/more external focus, increased social involvement); reinforce success. Objective Identify important people in life, past and present, and describe the quality, good and poor, of those relationships. Target Date: 2023-03-23 Frequency: Biweekly  Progress: 10 Modality: individual  Related Interventions Conduct Interpersonal Therapy beginning with the assessment of the client's "interpersonal inventory" of important past and present  relationships; develop a case formulation linking depression to grief, interpersonal role disputes, role transitions, and/or interpersonal deficits). Objective Learn and implement problem-solving and decision-making skills. Target Date: 2023-03-23 Frequency: Biweekly  Progress: 10 Modality: individual  Related Interventions Conduct Problem-Solving Therapy using techniques such as psychoeducation, modeling, and role-playing to teach client problem-solving skills (i.e., defining a problem specifically, generating possible solutions, evaluating the pros and cons of each solution, selecting and implementing a plan of action, evaluating the efficacy of the plan, accepting or revising the plan); role-play application of the problem-solving skill to a real life issue. Encourage in the client the development of a positive problem orientation in which problems and solving them are viewed as a natural part of life and not something to be feared, despaired, or avoided. 3. Develop healthy interpersonal relationships that lead to the alleviation and help prevent the relapse of depression. 4. Develop healthy thinking patterns and beliefs about self, others, and the world that lead to the alleviation and help prevent the relapse of depression. 5. Recognize, accept, and cope with feelings of depression. Diagnosis F33.1  Conditions For Discharge Achievement of treatment goals and objectives   [name], LCSW

## 2022-08-25 ENCOUNTER — Telehealth: Payer: Self-pay

## 2022-08-25 NOTE — Progress Notes (Signed)
Chronic Care Management Pharmacy Assistant   Name: Rachel Torres  MRN: [id number] DOB: 01/29/1956  Reason for Encounter: CCM (General Adherence)  Recent office visits:  07/13/22 Loura Pardon, MD Depression with anxiety Try Hydroxyzine for anxiety and sleep.   Recent consult visits:  08/11/22 Caregiver support program with [facility] 07/23/22 Lupe Carney Segmental and somatic dysfunction of thoracic region No other information 07/21/22 Caregiver support program with [facility] 07/13/22 Caregiver support program with [facility]  07/08/22 [name], MD (Family Medicine) Depression, emotional eating binges FU 2 weeks 07/06/22 Jeral Pinch Segmental and somatic dysfunction of thoracic region No other information 06/29/22 Jeral Pinch Segmental and somatic dysfunction of thoracic region No other information 06/25/22 Jeral Pinch Segmental and somatic dysfunction of thoracic region No other information 06/07/22 [name], MD (Cardiology) PAF Ordered: EKG Start: Ezetimibe 10 mg Start: Nitroglycerin 0.4 mg   Hospital visits:  None in previous 6 months  Medications: Outpatient Encounter Medications as of 08/25/2022  Medication Sig   acetaminophen (TYLENOL) 500 MG tablet Take 500 mg by mouth every 6 (six) hours as needed.   albuterol (VENTOLIN HFA) 108 (90 Base) MCG/ACT inhaler Inhale 1-2 puffs into the lungs every 6 (six) hours as needed for wheezing or shortness of breath.   ALPRAZolam (XANAX) 0.5 MG tablet Take 1 tablet (0.5 mg total) by mouth daily as needed for anxiety.   aspirin EC 81 MG tablet Take 81 mg by mouth at bedtime.   atorvastatin (LIPITOR) 20 MG tablet Take 1 tablet (20 mg total) by mouth daily. (Patient taking differently: Take 20 mg by mouth. FOUR TIMES A WEEK)   carvedilol (COREG) 6.25 MG tablet TAKE 1 TABLET TWICE DAILY WITH MEALS   cetirizine (ZYRTEC) 10 MG tablet Take 1 tablet (10 mg total) by mouth daily.   Cholecalciferol (VITAMIN D3) 50 MCG (2000 UT) CAPS Take 2,000 capsules  by mouth. FOUR TIMES A WEEK   clindamycin (CLEOCIN) 300 MG capsule 300 mg. AS NEEDED FOR DENTAL PROCEDURES   cyclobenzaprine (FLEXERIL) 10 MG tablet Take 0.5-1 tablets (5-10 mg total) by mouth 3 (three) times daily as needed. For headache   ezetimibe (ZETIA) 10 MG tablet Take 1 tablet (10 mg total) by mouth daily.   fluticasone (FLONASE) 50 MCG/ACT nasal spray Place 2 sprays into both nostrils daily.   furosemide (LASIX) 20 MG tablet Take 1 tablet (20 mg total) by mouth every other day. And as needed for swelling or shortness of breath (Patient taking differently: Take 20 mg by mouth. 4 times a week)   Insulin Pen Needle (BD PEN NEEDLE NANO U/F) 32G X 4 MM MISC Use Nano needle with Ozempic   Krill Oil (OMEGA-3) 500 MG CAPS Take 1 capsule by mouth daily.   lisinopril (ZESTRIL) 5 MG tablet TAKE 1/2 TABLET EVERY DAY. (Patient taking differently: TAKE 1/2 TABLET 4x a week)   magnesium gluconate (MAGONATE) 500 MG tablet Take 500 mg by mouth at bedtime.   Melatonin 5 MG SUBL Place under the tongue at bedtime as needed.   Misc Natural Products (TART CHERRY ADVANCED) CAPS Take 1 capsule by mouth daily.   naproxen sodium (ANAPROX) 220 MG tablet Take 440 mg by mouth 2 (two) times daily as needed (for headaches/pain).   nitroGLYCERIN (NITROSTAT) 0.4 MG SL tablet Place 1 tablet (0.4 mg total) under the tongue every 5 (five) minutes as needed for chest pain.   omeprazole (PRILOSEC) 40 MG capsule TAKE 1 CAPSULE EVERY DAY   polyethylene glycol powder (GLYCOLAX/MIRALAX) 17  GM/SCOOP powder Take 17 g by mouth as needed.   potassium chloride (KLOR-CON M) 10 MEQ tablet TAKE 1 TABLET (10 MEQ TOTAL) BY MOUTH EVERY OTHER DAY. TAKE WITH LASIX   promethazine (PHENERGAN) 25 MG tablet Take 0.5 tablets (12.5 mg total) by mouth every 8 (eight) hours as needed for nausea.   Simethicone (MYLANTA GAS PO) Take by mouth 3 times/day as needed-between meals & bedtime.   venlafaxine XR (EFFEXOR-XR) 150 MG 24 hr capsule TAKE 1 CAPSULE  EVERY DAY WITH BREAKFAST   venlafaxine XR (EFFEXOR-XR) 37.5 MG 24 hr capsule TAKE 1 CAPSULE (37.5 MG TOTAL) BY MOUTH DAILY WITH BREAKFAST.   No facility-administered encounter medications on file as of 08/25/2022.   Contacted Rozann C Gaspari on 09/07/2022 for general disease state and medication adherence call.   Patient is not more than 5 days past due for refill on the following medications per chart history:  [facility] Medications: Medication Name/mg Last Fill Days Supply Atorvastatin 20 mg  08/15/22 90 Lisinopril 5 mg   08/15/22 90  What concerns do you have about your medications? Patient does not have any concerns  The patient denies side effects with their medications.   How often do you forget or accidentally miss a dose? Never  Do you use a pillbox? Yes  Are you having any problems getting your medications from your pharmacy? No  Has the cost of your medications been a concern? No  Since last visit with CPP, the following interventions have been made.  Try Hydroxyzine for anxiety and sleep. Patient only takes 1 - 2 times a week   The patient has not had an ED visit since last contact.   The patient denies problems with their health.   Patient denies concerns or questions for Charlene Brooke, PharmD at this time.   Care Gaps: Annual wellness visit in last year? Yes 10/29/2021 Most Recent BP reading: 125/68 on 07/08/2022  Upcoming appointments: CCM appointment on 12/03/22  Charlene Brooke, CPP notified  [name], [place] Assistant [phone]

## 2022-09-07 ENCOUNTER — Ambulatory Visit (INDEPENDENT_AMBULATORY_CARE_PROVIDER_SITE_OTHER): Payer: Medicare HMO | Admitting: Psychology

## 2022-09-07 DIAGNOSIS — F331 Major depressive disorder, recurrent, moderate: Secondary | ICD-10-CM

## 2022-09-07 NOTE — Progress Notes (Signed)
[place] Counselor/Therapist Progress Note  Patient ID: Rachel Torres, MRN: [id number],    Date: 09/07/2022  Time Spent: 10:00am-10:50am    50 minutes   Treatment Type: Individual Therapy  Reported Symptoms: stress  Mental Status Exam: Appearance:  Casual     Behavior: Appropriate  Motor: Normal  Speech/Language:  Normal Rate  Affect: Appropriate  Mood: normal  Thought process: normal  Thought content:   WNL  Sensory/Perceptual disturbances:   WNL  Orientation: oriented to person, place, time/date, and situation  Attention: Good  Concentration: Good  Memory: WNL  Fund of knowledge:  Good  Insight:   Good  Judgment:  Good  Impulse Control: Good   Risk Assessment: Danger to Self:  No Self-injurious Behavior: No Danger to Others: No Duty to Warn:no Physical Aggression / Violence:No  Access to Firearms a concern: No  Gang Involvement:No   Subjective:  [name] present for face-to-face individual therapy via video Webex.  Pt consents to telehealth video session due to COVID 19 pandemic. Location of pt: home Location of therapist: home office.   Pt talked about Rachel Torres.   Pt took him to his psychiatrist who told Rachel Torres he can not have his drivers license back bc his dementia is too progressed.  Rachel Torres stated he did not know he had dementia.   Pt is continuing to provide around the clock caregiving for Rachel Torres.  She gets exhausted by all of the caregiving and doctors appointments.   Helped pt process her feelings.   Addressed how pt can get some respite.    Pt talked about traveling next week to go to her son's pinning ceremony for the [employer]. Pt just found out her son and his wife are expecting a baby.  Pt is very excited about this. Pt is going back to [facility] program to work on weight loss.  She states she has been doing a lot of emotional eating and wants to get help with that and get on a healthier eating plan.   Worked on self care  strategies.   Provided supportive therapy.  Interventions: Cognitive Behavioral Therapy and Insight-Oriented  Diagnosis: F33.1   Plan of Care: Recommend ongoing therapy.   Pt participated in setting treatment goals.   Pt needs a place to talk about stressors.  Plan to meet every two weeks.    Treatment Plan (Treatment Plan Target Date: 03/23/2023) Client Abilities/Strengths  Pt is bright, engaging, and motivated for therapy.   Client Treatment Preferences  Individual therapy.  Client Statement of Needs  Improve coping skills.  Have a place to talk about stressors.   Symptoms  Depressed or irritable mood. Unresolved grief issues.  Problems Addressed  Unipolar Depression Goals 1. Alleviate depressive symptoms and return to previous level of effective functioning. 2. Appropriately grieve the loss in order to normalize mood and to return to previously adaptive level of functioning. Objective Learn and implement behavioral strategies to overcome depression. Target Date: 2023-03-23 Frequency: Biweekly  Progress: 10 Modality: individual  Related Interventions Engage the client in "behavioral activation," increasing his/her activity level and contact with sources of reward, while identifying processes that inhibit activation.  Use behavioral techniques such as instruction, rehearsal, role-playing, role reversal, as needed, to facilitate activity in the client's daily life; reinforce success. Assist the client in developing skills that increase the likelihood of deriving pleasure from behavioral activation (e.g., assertiveness skills, developing an exercise plan, less internal/more external focus, increased social involvement); reinforce success. Objective Identify  important people in life, past and present, and describe the quality, good and poor, of those relationships. Target Date: 2023-03-23 Frequency: Biweekly  Progress: 10 Modality: individual  Related Interventions Conduct  Interpersonal Therapy beginning with the assessment of the client's "interpersonal inventory" of important past and present relationships; develop a case formulation linking depression to grief, interpersonal role disputes, role transitions, and/or interpersonal deficits). Objective Learn and implement problem-solving and decision-making skills. Target Date: 2023-03-23 Frequency: Biweekly  Progress: 10 Modality: individual  Related Interventions Conduct Problem-Solving Therapy using techniques such as psychoeducation, modeling, and role-playing to teach client problem-solving skills (i.e., defining a problem specifically, generating possible solutions, evaluating the pros and cons of each solution, selecting and implementing a plan of action, evaluating the efficacy of the plan, accepting or revising the plan); role-play application of the problem-solving skill to a real life issue. Encourage in the client the development of a positive problem orientation in which problems and solving them are viewed as a natural part of life and not something to be feared, despaired, or avoided. 3. Develop healthy interpersonal relationships that lead to the alleviation and help prevent the relapse of depression. 4. Develop healthy thinking patterns and beliefs about self, others, and the world that lead to the alleviation and help prevent the relapse of depression. 5. Recognize, accept, and cope with feelings of depression. Diagnosis F33.1  Conditions For Discharge Achievement of treatment goals and objectives   [name], LCSW

## 2022-09-14 ENCOUNTER — Ambulatory Visit (INDEPENDENT_AMBULATORY_CARE_PROVIDER_SITE_OTHER): Payer: Medicare HMO | Admitting: Family Medicine

## 2022-09-14 ENCOUNTER — Encounter (INDEPENDENT_AMBULATORY_CARE_PROVIDER_SITE_OTHER): Payer: Self-pay | Admitting: Family Medicine

## 2022-09-14 VITALS — BP 105/53 | HR 80 | Temp 97.7°F | Ht 65.0 in | Wt 193.0 lb

## 2022-09-14 DIAGNOSIS — R0789 Other chest pain: Secondary | ICD-10-CM

## 2022-09-14 DIAGNOSIS — Z6832 Body mass index (BMI) 32.0-32.9, adult: Secondary | ICD-10-CM | POA: Diagnosis not present

## 2022-09-14 DIAGNOSIS — R7303 Prediabetes: Secondary | ICD-10-CM

## 2022-09-14 DIAGNOSIS — F3289 Other specified depressive episodes: Secondary | ICD-10-CM | POA: Diagnosis not present

## 2022-09-14 DIAGNOSIS — F32A Depression, unspecified: Secondary | ICD-10-CM | POA: Insufficient documentation

## 2022-09-14 DIAGNOSIS — E669 Obesity, unspecified: Secondary | ICD-10-CM | POA: Diagnosis not present

## 2022-09-14 DIAGNOSIS — Z6835 Body mass index (BMI) 35.0-35.9, adult: Secondary | ICD-10-CM

## 2022-09-15 ENCOUNTER — Ambulatory Visit (INDEPENDENT_AMBULATORY_CARE_PROVIDER_SITE_OTHER)
Admission: RE | Admit: 2022-09-15 | Discharge: 2022-09-15 | Disposition: A | Discharge status: Home / Self Care | Payer: Medicare HMO | Source: Ambulatory Visit | Attending: Family Medicine | Admitting: Family Medicine

## 2022-09-15 ENCOUNTER — Ambulatory Visit (INDEPENDENT_AMBULATORY_CARE_PROVIDER_SITE_OTHER): Payer: Medicare HMO | Admitting: Family Medicine

## 2022-09-15 ENCOUNTER — Encounter: Payer: Self-pay | Admitting: Family Medicine

## 2022-09-15 VITALS — BP 154/78 | HR 84 | Temp 97.2°F | Ht 65.0 in | Wt 197.1 lb

## 2022-09-15 DIAGNOSIS — M25511 Pain in right shoulder: Secondary | ICD-10-CM

## 2022-09-15 DIAGNOSIS — R079 Chest pain, unspecified: Secondary | ICD-10-CM

## 2022-09-15 DIAGNOSIS — I10 Essential (primary) hypertension: Secondary | ICD-10-CM

## 2022-09-15 NOTE — Assessment & Plan Note (Addendum)
Intermittent corresponding to stress/ not exertional, and brief on L side  (feels fine today) EKG with some nonspecific T wave changes/not much change from baseline  cxr reassuring Cannot tell her for sure this is not an anginal equivalent given history, inst to take nitro if it re occurs and to call for cardiology appt for f/u as soon as she can  Reassuring exam  ER parameters reviewed in detail  It is clear from stress perspective she needs more help at home caring for husb with parkinson's -she is waiting on [place] for help

## 2022-09-15 NOTE — Progress Notes (Signed)
Subjective:    Patient ID: Rachel Torres, female    DOB: [birth year], 66 y.o.   MRN: [id number]  HPI Pt presents for chest and neck pain on the left side   Wt Readings from Last 3 Encounters:  09/15/22 197 lb 2 oz (89.4 kg)  09/14/22 193 lb (87.5 kg)  07/13/22 178 lb (80.7 kg)   32.80 kg/m  Very very stressed  Caring for her husband who is in and out of the hospital (parkinsons)   Occ gets a very sharp pain in L chest and it radiates up to neck This occurs right after stress (for instance putting husb to bed)  Lasts for a few minutes  Has not taken nitro (did not think it was her heart)  Burping helped   No sweating or sob or nausea  Does not happen with exertion  No more sob with exertion than previously (gained weight back with stress and comfort eating)     Some left shoulder problems also  Hurts to raise her arm  No trauma but does lift her husb  It hurts to lie on that side   Pulse ox 97%  H/o CAD and CHF and AAA and HTN  BP Readings from Last 3 Encounters:  09/15/22 (!) 154/78  09/14/22 (!) 105/53  07/08/22 125/68   Lisinopril 2.5 mg four days per week  Coreg 6.25 mg bid  Lasix 20 mg four days per week  Has nitroglycerin but did not take it   Pulse Readings from Last 3 Encounters:  09/15/22 84  09/14/22 80  07/08/22 82   Lab Results  Component Value Date   CHOL 182 03/17/2022   HDL 63.30 03/17/2022   LDLCALC 105 (H) 03/17/2022   LDLDIRECT 189.4 10/13/2011   TRIG 70.0 03/17/2022   CHOLHDL 3 03/17/2022   Atorvastatin 20 mg four times a week  Was on zetia- no longer   Cxr today DG Chest 2 View  Result Date: 09/15/2022 CLINICAL DATA:  Intermittent left-sided chest pain. EXAM: CHEST - 2 VIEW COMPARISON:  08/13/2021 FINDINGS: Previous median sternotomy. Heart size appears normal. No pleural effusion or edema. No airspace opacities identified. Previous right shoulder arthroplasty. Chronic mild superior endplate compression deformity  involving the T12 vertebra is unchanged from previous exam. No acute osseous findings. IMPRESSION: No active cardiopulmonary abnormalities. Electronically Signed   By: Kerby Moors M.D.   On: 09/15/2022 12:44     Patient Active Problem List   Diagnosis Date Noted   Right shoulder pain 09/15/2022   Depression 09/14/2022   Class 2 severe obesity with serious comorbidity and body mass index (BMI) of 35.0 to 35.9 in adult [facility]) 09/14/2022   Other constipation 05/12/2022   Medicare annual wellness visit, initial 07/17/2021   TMJ (dislocation of temporomandibular joint) 07/17/2021   Encounter for screening mammogram for breast cancer 06/05/2021   Diarrhea 04/11/2020   Epigastric pain 02/19/2020   Dark stools 02/19/2020   PAF (paroxysmal atrial fibrillation) ([place]) 02/05/2019   Shortness of breath 01/27/2019   Irregular surface of cornea 12/25/2018   HPV (human papilloma virus) infection 12/01/2016   Screening mammogram, encounter for 11/29/2016   Estrogen deficiency 11/29/2016   Encounter for routine gynecological examination 11/29/2016   Grade III hemorrhoids 10/06/2016   Tachycardia 08/14/2016   Hemorrhoids 08/13/2016   Atypical migraine 08/03/2016   CAD (coronary artery disease)    AAA (abdominal aortic aneurysm) (HCC)    Chronic combined systolic (congestive) and diastolic (congestive) heart failure ([place])  Prediabetes 02/11/2016   Generalized anxiety disorder 12/08/2015   Panic attacks 07/28/2015   Chest pain 07/09/2013   Sensorineural hearing loss 03/27/2013   Vitamin D deficiency 07/03/2012   H/O aortic dissection 05/12/2012   Routine general medical examination at a health care facility 09/20/2011   Atypical chest pain 09/15/2011   S/P AVR (aortic valve replacement) 01/07/2011   CORONARY ARTERY BYPASS GRAFT, HX OF 01/07/2011   Arthritis 12/20/2010   Back pain 12/15/2010   H/O dissecting abdominal aortic aneurysm repair 08/24/2010   IRRITABLE BOWEL SYNDROME 09/09/2009    Obstructive sleep apnea 08/04/2009   External hemorrhoid 06/26/2009   Osteoporosis 01/09/2009   Hyperlipidemia 07/26/2007   Depression with anxiety 07/26/2007   Essential hypertension 07/26/2007   Allergic rhinitis 07/26/2007   Asthma 07/26/2007   GERD 07/26/2007   Osteoarthritis 07/26/2007   Ocular migraine 07/26/2007   Mild intermittent asthma without complication 16/09/9603   Past Medical History:  Diagnosis Date   AAA (abdominal aortic aneurysm) ([place])    a. Chronic w/o evidence of aneurysmal dil on CTA 01/2016.   Alcohol abuse    Allergy    Anemia    Anxiety    Asthma    CAD (coronary artery disease)    a. 08/2010 s/p CABG x 1 (VG->RCA) @ time of Ao dissection repair; b. 01/2016 Lexiscan MV: mid antsept/apical defect w/ ? peri-infarct ischemia-->likely attenuation-->Med Rx.   Chewing difficulty    Chronic combined systolic (congestive) and diastolic (congestive) heart failure ([place])    a. 2012 EF 30-35%; b. 04/2015 EF 40-45%; c. 01/2016 Echo: Ef 55-65%, nl AoV bioprosthesis, nl RV, nl PASP.   Constipation    Depression    Edema of both lower extremities    Gallbladder sludge    GERD (gastroesophageal reflux disease)    Heart failure (HCC)    Heart valve problem    History of Bicuspid Aortic Valve    a. 08/2010 s/p AVR @ time of Ao dissection repair; b. 01/2016 Echo: Ef 55-65%, nl AoV bioprosthesis, nl RV, nl PASP.   Hx of repair of dissecting thoracic aortic aneurysm, [place] type A    a. 08/2010 s/p repair with AVR and VG->RCA; b. 01/2016 CTA: stable appearance of Asc Thoracic Aortic graft. Opacification of flase lumen of chronic abd Ao dissection w/ retrograde flow through lumbar arteries. No aneurysmal dil of flase lumen.   Hyperlipidemia    Hypertension    Hypertensive heart disease    IBS (irritable bowel syndrome)    Internal hemorrhoids    Kidney problem    Marfan syndrome    pt denies   Migraine    Obstructive sleep apnea    Osteoarthritis    Osteoporosis     Palpitations    Pneumonia    RA (rheumatoid arthritis) ([place])    a. Followed by Dr. [name]   Past Surgical History:  Procedure Laterality Date   ABDOMINAL AORTIC ANEURYSM REPAIR     CARDIAC CATHETERIZATION  2001   [place]     ESOPHAGOGASTRODUODENOSCOPY (EGD) WITH PROPOFOL N/A 05/08/2020   Procedure: ESOPHAGOGASTRODUODENOSCOPY (EGD) WITH PROPOFOL;  Surgeon: Virgel Manifold, MD;  Location: [facility] ENDOSCOPY;  Service: Endoscopy;  Laterality: N/A;   FRACTURE SURGERY Right    shoulder replacement   HEMORRHOID BANDING     ORIF DISTAL RADIUS FRACTURE Left    UPPER GASTROINTESTINAL ENDOSCOPY     Social History   Tobacco Use  Smoking status: Former    Packs/day: 0.75    Years: 1.50    Total pack years: 1.13    Types: Cigarettes    Quit date: 12/20/1978    Years since quitting: 43.7   Smokeless tobacco: Never  Vaping Use   Vaping Use: Never used  Substance Use Topics   Alcohol use: Yes    Alcohol/week: 0.0 standard drinks of alcohol    Comment: 2-3 glasses of red wine per week   Drug use: No   Family History  Problem Relation Age of Onset   Hypertension Mother    Depression Mother    Osteoporosis Mother    Stroke Mother    Irritable bowel syndrome Mother    Asthma Mother    Heart disease Mother    Thyroid disease Mother    Anxiety disorder Mother    Arthritis Sister    Diabetes Sister    Heart disease Sister    Asthma Sister    Migraines Sister    Nephrolithiasis Sister    Osteoporosis Sister    Arthritis Sister    Asthma Sister    Migraines Sister    Other Father 10   Stroke Maternal Grandmother    Colon cancer Maternal Grandmother    Lymphoma Maternal Grandmother    Migraines Maternal Grandmother    Aneurysm Paternal Grandmother        brain   Diabetes Paternal Grandmother    Arthritis Paternal Grandmother    Arthritis Paternal Grandfather    Diabetes Paternal Grandfather    Other Brother         prediabeties   Asthma Brother    Migraines Brother    Lung cancer Maternal Grandfather    Melanoma Maternal Grandfather    Allergies  Allergen Reactions   Fosamax [Alendronate Sodium]     GI upset    Ginzing [Ginseng] Other (See Comments)    Hyper and jitery    Hydrocod Poli-Chlorphe Poli Er Itching   Hydrocodone-Acetaminophen Itching   Imitrex [Sumatriptan]     Contraindicated w/ heart condition   Oxycodone Itching   Peanut-Containing Drug Products Other (See Comments)    Reaction:  Migraines    Tramadol Other (See Comments)    itching   Hydrocodone-Guaifenesin Itching   Prednisone Rash   Tape Itching   Current Outpatient Medications on File Prior to Visit  Medication Sig Dispense Refill   acetaminophen (TYLENOL) 500 MG tablet Take 500 mg by mouth every 6 (six) hours as needed.     albuterol (VENTOLIN HFA) 108 (90 Base) MCG/ACT inhaler Inhale 1-2 puffs into the lungs every 6 (six) hours as needed for wheezing or shortness of breath. 3 each 3   ALPRAZolam (XANAX) 0.5 MG tablet Take 1 tablet (0.5 mg total) by mouth daily as needed for anxiety. 30 tablet 0   aspirin EC 81 MG tablet Take 81 mg by mouth at bedtime.     atorvastatin (LIPITOR) 20 MG tablet Take 1 tablet (20 mg total) by mouth daily. (Patient taking differently: Take 20 mg by mouth. FOUR TIMES A WEEK) 90 tablet 3   carvedilol (COREG) 6.25 MG tablet TAKE 1 TABLET TWICE DAILY WITH MEALS 180 tablet 3   Cholecalciferol (VITAMIN D3) 50 MCG (2000 UT) CAPS Take 2,000 capsules by mouth. FOUR TIMES A WEEK     clindamycin (CLEOCIN) 300 MG capsule 300 mg. AS NEEDED FOR DENTAL PROCEDURES     cyclobenzaprine (FLEXERIL) 10 MG tablet Take 0.5-1 tablets (5-10 mg total) by  mouth 3 (three) times daily as needed. For headache 90 tablet 3   furosemide (LASIX) 20 MG tablet Take 1 tablet (20 mg total) by mouth every other day. And as needed for swelling or shortness of breath (Patient taking differently: Take 20 mg by mouth. 4 times a week)  55 tablet 3   Insulin Pen Needle (BD PEN NEEDLE NANO U/F) 32G X 4 MM MISC Use Nano needle with Ozempic 100 each 0   Krill Oil (OMEGA-3) 500 MG CAPS Take 1 capsule by mouth daily.     lisinopril (ZESTRIL) 5 MG tablet TAKE 1/2 TABLET EVERY DAY. (Patient taking differently: TAKE 1/2 TABLET 4x a week) 45 tablet 3   magnesium gluconate (MAGONATE) 500 MG tablet Take 500 mg by mouth at bedtime.     Melatonin 5 MG SUBL Place under the tongue at bedtime as needed.     Misc Natural Products (TART CHERRY ADVANCED) CAPS Take 1 capsule by mouth daily.     nitroGLYCERIN (NITROSTAT) 0.4 MG SL tablet Place 1 tablet (0.4 mg total) under the tongue every 5 (five) minutes as needed for chest pain. 25 tablet 3   omeprazole (PRILOSEC) 40 MG capsule TAKE 1 CAPSULE EVERY DAY 90 capsule 3   polyethylene glycol powder (GLYCOLAX/MIRALAX) 17 GM/SCOOP powder Take 17 g by mouth as needed. 255 g 0   potassium chloride (KLOR-CON M) 10 MEQ tablet TAKE 1 TABLET (10 MEQ TOTAL) BY MOUTH EVERY OTHER DAY. TAKE WITH LASIX 45 tablet 3   promethazine (PHENERGAN) 25 MG tablet Take 0.5 tablets (12.5 mg total) by mouth every 8 (eight) hours as needed for nausea. 90 tablet 3   Simethicone (MYLANTA GAS PO) Take by mouth 3 times/day as needed-between meals & bedtime.     venlafaxine XR (EFFEXOR-XR) 150 MG 24 hr capsule TAKE 1 CAPSULE EVERY DAY WITH BREAKFAST 7 capsule 0   venlafaxine XR (EFFEXOR-XR) 37.5 MG 24 hr capsule TAKE 1 CAPSULE (37.5 MG TOTAL) BY MOUTH DAILY WITH BREAKFAST. 90 capsule 2   ezetimibe (ZETIA) 10 MG tablet Take 1 tablet (10 mg total) by mouth daily. (Patient not taking: Reported on 09/15/2022) 90 tablet 3   No current facility-administered medications on file prior to visit.    Review of Systems  Constitutional:  Positive for fatigue. Negative for activity change, appetite change, fever and unexpected weight change.  HENT:  Negative for congestion, ear pain, rhinorrhea, sinus pressure and sore throat.   Eyes:  Negative  for pain, redness and visual disturbance.  Respiratory:  Negative for cough, shortness of breath and wheezing.   Cardiovascular:  Positive for chest pain. Negative for palpitations and leg swelling.  Gastrointestinal:  Negative for abdominal pain, blood in stool, constipation and diarrhea.  Endocrine: Negative for polydipsia and polyuria.  Genitourinary:  Negative for dysuria, frequency and urgency.  Musculoskeletal:  Positive for arthralgias, back pain and neck pain. Negative for myalgias.  Skin:  Negative for pallor and rash.  Allergic/Immunologic: Negative for environmental allergies.  Neurological:  Negative for dizziness, syncope and headaches.  Hematological:  Negative for adenopathy. Does not bruise/bleed easily.  Psychiatric/Behavioral:  Positive for dysphoric mood. Negative for decreased concentration. The patient is nervous/anxious.        Objective:   Physical Exam Constitutional:      General: She is not in acute distress.    Appearance: She is well-developed. She is obese.  HENT:     Head: Normocephalic and atraumatic.  Eyes:     Conjunctiva/sclera: Conjunctivae normal.  Pupils: Pupils are equal, round, and reactive to light.  Neck:     Thyroid: No thyromegaly.     Vascular: No carotid bruit or JVD.  Cardiovascular:     Rate and Rhythm: Normal rate and regular rhythm.     Pulses: Normal pulses.     Heart sounds:     No gallop.  Pulmonary:     Effort: Pulmonary effort is normal. No respiratory distress.     Breath sounds: Normal breath sounds. No stridor. No wheezing, rhonchi or rales.  Chest:     Chest wall: No tenderness.  Abdominal:     General: There is no distension or abdominal bruit.     Palpations: Abdomen is soft.  Musculoskeletal:     Cervical back: Normal range of motion and neck supple.     Right lower leg: No edema.     Left lower leg: No edema.     Comments: Limited rom of L shoulder  Pain with hawking maneuver  Some mild acromion  tenderness No swelling No skin change  No crepitus   Lymphadenopathy:     Cervical: No cervical adenopathy.  Skin:    General: Skin is warm and dry.     Coloration: Skin is not jaundiced or pale.     Findings: No bruising, erythema or rash.  Neurological:     Mental Status: She is alert.     Cranial Nerves: No cranial nerve deficit.     Coordination: Coordination normal.     Deep Tendon Reflexes: Reflexes are normal and symmetric. Reflexes normal.  Psychiatric:        Mood and Affect: Mood is anxious.     Comments: Candidly discusses symptoms and stressors             Assessment & Plan:   Problem List Items Addressed This Visit       Cardiovascular and Mediastinum   Essential hypertension    bp is up above baseline today in setting of stress  No cp during the visit   Lisinopril 2.5 mg four days weekly Lasix 20 mg four days  Coreg 6.25 mg bid       Relevant Orders   EKG 12-Lead (Completed)   DG Chest 2 View (Completed)     Other   Chest pain - Primary    Intermittent corresponding to stress/ not exertional, and brief on L side  (feels fine today) EKG with some nonspecific T wave changes/not much change from baseline  cxr reassuring Cannot tell her for sure this is not an anginal equivalent given history, inst to take nitro if it re occurs and to call for cardiology appt for f/u as soon as she can  Reassuring exam  ER parameters reviewed in detail  It is clear from stress perspective she needs more help at home caring for husb with parkinson's -she is waiting on [place] for help       Relevant Orders   EKG 12-Lead (Completed)   DG Chest 2 View (Completed)   Right shoulder pain    This occurs when she tries to lie on it  Reproduced with Hawking's maneuver  Has h/o tendonitis and may need cortisone shot if not imp   inst to avoid oral nsaid in light of cardiac hx  Try voltaren gel and ice  rom exercise  Do not put pressure on  F/u with Dr Lorelei Pont Ann Held me  if needed

## 2022-09-15 NOTE — Patient Instructions (Addendum)
Take your nitroglycerin if chest pain returns  If it persists - or worsens please go to the ER   Please call and schedule a follow up appt with Dr [name] (or first available)   Chest xray now  We will call you with the result   Use ice on your shoulder  Do not lie on that side  Try voltaren gel on the shoulder   Make an appt with Dr Lorelei Pont if the shoulder keeps bothering you

## 2022-09-15 NOTE — Assessment & Plan Note (Signed)
This occurs when she tries to lie on it  Reproduced with Hawking's maneuver  Has h/o tendonitis and may need cortisone shot if not imp   inst to avoid oral nsaid in light of cardiac hx  Try voltaren gel and ice  rom exercise  Do not put pressure on  F/u with Dr Lorelei Pont Ann Held me if needed

## 2022-09-15 NOTE — Assessment & Plan Note (Signed)
bp is up above baseline today in setting of stress  No cp during the visit   Lisinopril 2.5 mg four days weekly Lasix 20 mg four days  Coreg 6.25 mg bid

## 2022-09-16 NOTE — Progress Notes (Signed)
Chief Complaint:   OBESITY Rachel Torres is here to discuss her progress with her obesity treatment plan along with follow-up of her obesity related diagnoses. Rachel Torres is on practicing portion control and making smarter food choices, such as increasing vegetables and decreasing simple carbohydrates and states she is following her eating plan approximately 0% of the time. Rachel Torres states she is doing 0 minutes 0 times per week.  Today's visit was #: 28 Starting weight: 213 lbs Starting date: 05/05/2020 Today's weight: 193 lbs Today's date: 09/14/2022 Total lbs lost to date: 20 Total lbs lost since last in-office visit: 0  Interim History: Rachel Torres reports severe stress with her husband's illness over the past 2 months. She is doing a lot of comfort eating. She is trying to get in protein but she is definitely stress eating. She cannot leave her husband alone at this time.   Subjective:   1. Prediabetes Latroya's last A1c was 5.7 and insulin 25.2.  2. Atypical chest pain Rachel Torres is having intermittent episodes of sharp left chest pain and shortness of breath. We discussed the importance of following up with her PCP.   3. Other depression, emotional eating behaviors Rachel Torres notes severe stress due to her husband's illness. She hasn't been able to see Dr. Mallie Mussel as of yet.   Assessment/Plan:   1. Prediabetes Rachel Torres was encouraged to increase her protein in her diet and avoid simple carbohydrates.   2. Atypical chest pain Rachel Torres was advised to call her PCP today for a follow-up appointment or Cardiology. She was advised to seek emergent care for recurrent episodes in the ER for further evaluation and treatment.   3. Other depression, emotional eating behaviors Rachel Torres was referred to Dr. Mallie Mussel for evaluation.   4. Obesity, Current BMI 32.2 Rachel Torres is currently in the action stage of change. As such, her goal is to continue with weight loss efforts. She has agreed to practicing  portion control and making smarter food choices, such as increasing vegetables and decreasing simple carbohydrates and following a lower carbohydrate, vegetable and lean protein rich diet plan.   Encouragement and support was provided, and we discussed increasing protein meals with less prep time and other strategies.   Exercise goals: No exercise has been prescribed at this time.  Behavioral modification strategies: increasing lean protein intake, meal planning and cooking strategies, keeping healthy foods in the home, and emotional eating strategies.  Rachel Torres has agreed to follow-up with our clinic in 6 weeks. She was informed of the importance of frequent follow-up visits to maximize her success with intensive lifestyle modifications for her multiple health conditions.   Objective:   Blood pressure (!) 105/53, pulse 80, temperature 97.7 F (36.5 C), height '5\' 5"'$  (1.651 m), weight 193 lb (87.5 kg), SpO2 98 %. Body mass index is 32.12 kg/m.  General: Cooperative, alert, well developed, in no acute distress. HEENT: Conjunctivae and lids unremarkable. Cardiovascular: Regular rhythm.  Lungs: Normal work of breathing. Neurologic: No focal deficits.   Lab Results  Component Value Date   CREATININE 0.82 03/17/2022   BUN 12 03/17/2022   NA 136 03/17/2022   K 4.7 03/17/2022   CL 102 03/17/2022   CO2 27 03/17/2022   Lab Results  Component Value Date   ALT 16 12/03/2021   AST 18 12/03/2021   ALKPHOS 68 12/03/2021   BILITOT 0.5 12/03/2021   Lab Results  Component Value Date   HGBA1C 5.5 03/17/2022   HGBA1C 5.5 12/03/2021   HGBA1C 5.6 07/10/2021  HGBA1C 5.5 02/26/2021   HGBA1C 5.7 (H) 10/29/2020   Lab Results  Component Value Date   INSULIN 4.5 12/03/2021   INSULIN 17.3 02/26/2021   INSULIN 13.9 10/29/2020   INSULIN 22.5 07/23/2020   INSULIN 25.2 (H) 05/05/2020   Lab Results  Component Value Date   TSH 1.54 07/10/2021   Lab Results  Component Value Date   CHOL 182  03/17/2022   HDL 63.30 03/17/2022   LDLCALC 105 (H) 03/17/2022   LDLDIRECT 189.4 10/13/2011   TRIG 70.0 03/17/2022   CHOLHDL 3 03/17/2022   Lab Results  Component Value Date   VD25OH 57.7 12/03/2021   VD25OH 74.87 07/10/2021   VD25OH 62.2 02/26/2021   Lab Results  Component Value Date   WBC 5.6 07/10/2021   HGB 13.9 07/10/2021   HCT 41.3 07/10/2021   MCV 96.2 07/10/2021   PLT 274.0 07/10/2021   Lab Results  Component Value Date   FERRITIN 105.0 12/11/2010   Attestation Statements:   Reviewed by clinician on day of visit: allergies, medications, problem list, medical history, surgical history, family history, social history, and previous encounter notes.  Time spent on visit including pre-visit chart review and post-visit care and charting was 30 minutes.   I, Trixie Dredge, am acting as transcriptionist for [name], MD.  I have reviewed the above documentation for accuracy and completeness, and I agree with the above. -  [name], MD

## 2022-09-16 NOTE — Progress Notes (Unsigned)
  Office: [phone]  /  Fax: [phone]    Date: 09/27/2022   Appointment Start Time: *** Duration: *** minutes Provider: Glennie Isle, Psy.D. Type of Session: Individual Therapy  Location of Patient: {gbptloc:23249} (private location) Location of Provider: Provider's Home (private office) Type of Contact: Telepsychological Visit via MyChart Video Visit  Session Content: Rachel Torres is a 67 y.o. female presenting for a follow-up appointment to address the previously established treatment goal of increasing coping skills.Today's appointment was a telepsychological visit. Rachel Torres provided verbal consent for today's telepsychological appointment and she is aware she is responsible for securing confidentiality on her end of the session. Prior to proceeding with today's appointment, Rachel Torres's physical location at the time of this appointment was obtained as well a phone number she could be reached at in the event of technical difficulties. Rachel Torres and this provider participated in today's telepsychological service.   This provider conducted a brief check-in. *** Rachel Torres was receptive to today's appointment as evidenced by openness to sharing, responsiveness to feedback, and {gbreceptiveness:23401}.  Mental Status Examination:  Appearance: {Appearance:22431} Behavior: {Behavior:22445} Mood: {gbmood:21757} Affect: {Affect:22436} Speech: {Speech:22432} Eye Contact: {Eye Contact:22433} Psychomotor Activity: {Motor Activity:22434} Gait: {gbgait:23404} Thought Process: {thought process:22448}  Thought Content/Perception: {disturbances:22451} Orientation: {Orientation:22437} Memory/Concentration: {gbcognition:22449} Insight: {Insight:22446} Judgment: {Insight:22446}  Interventions:  {Interventions for Progress Notes:23405}  DSM-5 Diagnosis(es): {Diagnoses:22752}  Treatment Goal & Progress: During the initial appointment with this provider, the following treatment goal was established:  increase coping skills. Rachel Torres has demonstrated progress in her goal as evidenced by {gbtxprogress:22839}. Rachel Torres also {gbtxprogress2:22951}.  Plan: The next appointment is scheduled for *** at ***, which will be via MyChart Video Visit. The next session will focus on {Plan for Next Appointment:23400}.

## 2022-09-20 NOTE — Progress Notes (Signed)
Cardiology Clinic Note   Patient Name: Rachel Torres Date of Encounter: 09/21/2022  Primary Care Provider:  [name], MD Primary Cardiologist:  [name], MD  Patient Profile    66 year old female with a past medical history of aortic type a dissection, aortic valve replacement, CAD with CABG, chronic back pain, chronic shortness of breath, hypertension, hyperlipidemia, Marfan syndrome, who is here today to follow-up on his CAD.  , Past Medical History    Past Medical History:  Diagnosis Date   AAA (abdominal aortic aneurysm) ([place])    a. Chronic w/o evidence of aneurysmal dil on CTA 01/2016.   Alcohol abuse    Allergy    Anemia    Anxiety    Asthma    CAD (coronary artery disease)    a. 08/2010 s/p CABG x 1 (VG->RCA) @ time of Ao dissection repair; b. 01/2016 Lexiscan MV: mid antsept/apical defect w/ ? peri-infarct ischemia-->likely attenuation-->Med Rx.   Chewing difficulty    Chronic combined systolic (congestive) and diastolic (congestive) heart failure ([place])    a. 2012 EF 30-35%; b. 04/2015 EF 40-45%; c. 01/2016 Echo: Ef 55-65%, nl AoV bioprosthesis, nl RV, nl PASP.   Constipation    Depression    Edema of both lower extremities    Gallbladder sludge    GERD (gastroesophageal reflux disease)    Heart failure (HCC)    Heart valve problem    History of Bicuspid Aortic Valve    a. 08/2010 s/p AVR @ time of Ao dissection repair; b. 01/2016 Echo: Ef 55-65%, nl AoV bioprosthesis, nl RV, nl PASP.   Hx of repair of dissecting thoracic aortic aneurysm, [place] type A    a. 08/2010 s/p repair with AVR and VG->RCA; b. 01/2016 CTA: stable appearance of Asc Thoracic Aortic graft. Opacification of flase lumen of chronic abd Ao dissection w/ retrograde flow through lumbar arteries. No aneurysmal dil of flase lumen.   Hyperlipidemia    Hypertension    Hypertensive heart disease    IBS (irritable bowel syndrome)    Internal hemorrhoids    Kidney problem    Marfan syndrome     pt denies   Migraine    Obstructive sleep apnea    Osteoarthritis    Osteoporosis    Palpitations    Pneumonia    RA (rheumatoid arthritis) ([place])    a. Followed by Dr. [name]   Past Surgical History:  Procedure Laterality Date   ABDOMINAL AORTIC ANEURYSM REPAIR     CARDIAC CATHETERIZATION  2001   [place]     ESOPHAGOGASTRODUODENOSCOPY (EGD) WITH PROPOFOL N/A 05/08/2020   Procedure: ESOPHAGOGASTRODUODENOSCOPY (EGD) WITH PROPOFOL;  Surgeon: Virgel Manifold, MD;  Location: [facility] ENDOSCOPY;  Service: Endoscopy;  Laterality: N/A;   FRACTURE SURGERY Right    shoulder replacement   HEMORRHOID BANDING     ORIF DISTAL RADIUS FRACTURE Left    UPPER GASTROINTESTINAL ENDOSCOPY      Allergies  Allergies  Allergen Reactions   Fosamax [Alendronate Sodium]     GI upset    Ginzing [Ginseng] Other (See Comments)    Hyper and jitery    Hydrocod Poli-Chlorphe Poli Er Itching   Hydrocodone-Acetaminophen Itching   Imitrex [Sumatriptan]     Contraindicated w/ heart condition   Oxycodone Itching   Peanut-Containing Drug Products Other (See Comments)    Reaction:  Migraines    Tramadol Other (See Comments)  itching   Hydrocodone-Guaifenesin Itching   Prednisone Rash   Tape Itching    History of Present Illness    Rachel Torres is a 66 year old female with past medical history of aorta type B dissection September 2011 who underwent aortic valve replacement for bicuspid aortic valve at [facility] as well as CABG x1 there was a SVG to the RCA, last echocardiogram was completed in August 2020 with a EF of 45-50%, chronic back pain, hypertension, hyperlipidemia, Marfan syndrome, and OSA who is not on CPAP.  She had 1 previous 30-day monitor ordered for palpitations which showed PACs and PVCs.  She was then subsequently diagnosed with obstructive sleep apnea but is noncompliant with her CPAP machine.  She has had several evaluations in  the emergency department for chest pain.  Cardiac enzymes were negative, EKG unchanged, she had a CT scan dated 06/21/2023.  Was transferred to [facility] given her complicated anatomy and images were evaluated by vascular surgery was determined there have been no significant progression of her disease.  She had previously been evaluated [facility] for shortness of breath and chest discomfort.  Cardiac work-up revealed echocardiogram and PET stress, CT of the chest showing stable descending aorta repair with a type B aortic dissection with opacification of the false lumen at the level of the renal arteries via a lumbar artery.  She was last seen in clinic by Dr. [name] on 06/07/2022 with complaints of shortness of breath and distress and experiencing chest pain off and on due to stressful situation between her husband and her mother.  There were no changes made to her medication at that visit and no further testing was scheduled.  She returns to clinic today emotional and tearful.  She continues to care for her husband that has dementia with Parkinson's disease.  She previously followed up with her primary care provider had been complaining of chest discomfort for approximately 6 weeks.  She describes the chest discomfort as a pressure that is midsternal she states that it does radiate into her left jaw.  Happens with rest or exertion.  She is found no alleviating or aggravating factors.  Since her blood pressure typically runs lower she has always been advised to take her blood pressure before she takes any of her nitro but she has yet to try taking a nitro for the chest discomfort that she has had.  She also complains of some shortness of breath and feeling as though that she is retaining fluid in her abdomen and her lower extremities.  She was previously on as needed furosemide and has been taking that daily.  She denies any worsening of palpitations but states that she is under an increased amount of stress and she could  have just not been paying attention to them like previously.  She denies any hospitalizations or visits to the emergency department.  Home Medications    Current Outpatient Medications  Medication Sig Dispense Refill   acetaminophen (TYLENOL) 500 MG tablet Take 500 mg by mouth every 6 (six) hours as needed.     albuterol (VENTOLIN HFA) 108 (90 Base) MCG/ACT inhaler Inhale 1-2 puffs into the lungs every 6 (six) hours as needed for wheezing or shortness of breath. 3 each 3   ALPRAZolam (XANAX) 0.5 MG tablet Take 1 tablet (0.5 mg total) by mouth daily as needed for anxiety. 30 tablet 0   aspirin EC 81 MG tablet Take 81 mg by mouth at bedtime.     atorvastatin (LIPITOR) 20 MG tablet  Take 1 tablet (20 mg total) by mouth daily. (Patient taking differently: Take 20 mg by mouth. FOUR TIMES A WEEK) 90 tablet 3   carvedilol (COREG) 6.25 MG tablet TAKE 1 TABLET TWICE DAILY WITH MEALS 180 tablet 3   Cholecalciferol (VITAMIN D3) 50 MCG (2000 UT) CAPS Take 2,000 capsules by mouth. FOUR TIMES A WEEK     clindamycin (CLEOCIN) 300 MG capsule 300 mg. AS NEEDED FOR DENTAL PROCEDURES     cyclobenzaprine (FLEXERIL) 10 MG tablet Take 0.5-1 tablets (5-10 mg total) by mouth 3 (three) times daily as needed. For headache 90 tablet 3   furosemide (LASIX) 20 MG tablet Take 1 tablet (20 mg total) by mouth every other day. And as needed for swelling or shortness of breath (Patient taking differently: Take 20 mg by mouth. 4 times a week) 55 tablet 3   Insulin Pen Needle (BD PEN NEEDLE NANO U/F) 32G X 4 MM MISC Use Nano needle with Ozempic 100 each 0   Krill Oil (OMEGA-3) 500 MG CAPS Take 1 capsule by mouth daily.     lisinopril (ZESTRIL) 5 MG tablet TAKE 1/2 TABLET EVERY DAY. (Patient taking differently: TAKE 1/2 TABLET 4x a week) 45 tablet 3   magnesium gluconate (MAGONATE) 500 MG tablet Take 500 mg by mouth at bedtime.     Melatonin 5 MG SUBL Place under the tongue at bedtime as needed.     Misc Natural Products (TART  CHERRY ADVANCED) CAPS Take 1 capsule by mouth daily.     nitroGLYCERIN (NITROSTAT) 0.4 MG SL tablet Place 1 tablet (0.4 mg total) under the tongue every 5 (five) minutes as needed for chest pain. 25 tablet 3   omeprazole (PRILOSEC) 40 MG capsule TAKE 1 CAPSULE EVERY DAY 90 capsule 3   polyethylene glycol powder (GLYCOLAX/MIRALAX) 17 GM/SCOOP powder Take 17 g by mouth as needed. 255 g 0   potassium chloride (KLOR-CON M) 10 MEQ tablet TAKE 1 TABLET (10 MEQ TOTAL) BY MOUTH EVERY OTHER DAY. TAKE WITH LASIX 45 tablet 3   promethazine (PHENERGAN) 25 MG tablet Take 0.5 tablets (12.5 mg total) by mouth every 8 (eight) hours as needed for nausea. 90 tablet 3   Simethicone (MYLANTA GAS PO) Take by mouth 3 times/day as needed-between meals & bedtime.     venlafaxine XR (EFFEXOR-XR) 150 MG 24 hr capsule TAKE 1 CAPSULE EVERY DAY WITH BREAKFAST 7 capsule 0   venlafaxine XR (EFFEXOR-XR) 37.5 MG 24 hr capsule TAKE 1 CAPSULE (37.5 MG TOTAL) BY MOUTH DAILY WITH BREAKFAST. 90 capsule 2   ezetimibe (ZETIA) 10 MG tablet Take 1 tablet (10 mg total) by mouth daily. (Patient not taking: Reported on 09/15/2022) 90 tablet 3   No current facility-administered medications for this visit.     Family History    Family History  Problem Relation Age of Onset   Hypertension Mother    Depression Mother    Osteoporosis Mother    Stroke Mother    Irritable bowel syndrome Mother    Asthma Mother    Heart disease Mother    Thyroid disease Mother    Anxiety disorder Mother    Arthritis Sister    Diabetes Sister    Heart disease Sister    Asthma Sister    Migraines Sister    Nephrolithiasis Sister    Osteoporosis Sister    Arthritis Sister    Asthma Sister    Migraines Sister    Other Father 65   Stroke Maternal Grandmother  Colon cancer Maternal Grandmother    Lymphoma Maternal Grandmother    Migraines Maternal Grandmother    Aneurysm Paternal Grandmother        brain   Diabetes Paternal Grandmother     Arthritis Paternal Grandmother    Arthritis Paternal Grandfather    Diabetes Paternal Grandfather    Other Brother        prediabeties   Asthma Brother    Migraines Brother    Lung cancer Maternal Grandfather    Melanoma Maternal Grandfather    She indicated that her mother is alive. She indicated that her father is deceased. She indicated that both of her sisters are alive. She indicated that her brother is alive. She indicated that her maternal grandmother is deceased. She indicated that her maternal grandfather is deceased. She indicated that her paternal grandmother is deceased. She indicated that her paternal grandfather is deceased. She indicated that both of her maternal uncles are deceased.  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    Employer: [employer]  Tobacco Use   Smoking status: Former    Packs/day: 0.75    Years: 1.50    Total pack years: 1.13    Types: Cigarettes    Quit date: 12/20/1978    Years since quitting: 43.7   Smokeless tobacco: Never  Vaping Use   Vaping Use: Never used  Substance and Sexual Activity   Alcohol use: Yes    Alcohol/week: 0.0 standard drinks of alcohol    Comment: 2-3 glasses of red wine per week   Drug use: No   Sexual activity: Never  Other Topics Concern   Not on file  Social History Narrative    married for the second time, happily   On disability after dissection of aortic aneurysm   Husband has Parkinson's - deteriorating w/ dementia   4-6 caffeinated beverages daily   01/25/2018         Social Determinants of Health   Financial Resource Strain: Low Risk  (10/29/2021)   Overall Financial Resource Strain (CARDIA)    Difficulty of Paying Living Expenses: Not hard at all  Food Insecurity: No Food Insecurity  (10/29/2021)   Hunger Vital Sign    Worried About Running Out of Food in the Last Year: Never true    Ran Out of Food in the Last Year: Never true  Transportation Needs: No Transportation Needs (10/29/2021)   PRAPARE - Hydrologist (Medical): No    Lack of Transportation (Non-Medical): No  Physical Activity: Inactive (10/29/2021)   Exercise Vital Sign    Days of Exercise per Week: 0 days    Minutes of Exercise per Session: 0 min  Stress: No Stress Concern Present (10/29/2021)   [place]    Feeling of Stress : Not at all  Social Connections: [place] (10/29/2021)   Social Connection and Isolation Panel [NHANES]    Frequency of Communication with Friends and Family: More than three times a week    Frequency of Social Gatherings with Friends and Family: Twice a week    Attends Religious Services: More than 4 times per year    Active Member of [employer] or Organizations:  Yes    Attends Club or Organization Meetings: More than 4 times per year    Marital Status: Married  Human resources officer Violence: Not At Risk (10/29/2021)   Humiliation, Afraid, Rape, and Kick questionnaire    Fear of Current or Ex-Partner: No    Emotionally Abused: No    Physically Abused: No    Sexually Abused: No     Review of Systems    General:  No chills, fever, night sweats or weight changes.  Endorses fatigue Cardiovascular: Endorses chest pain, dyspnea on exertion, edema, but denies orthopnea, palpitations, paroxysmal nocturnal dyspnea. Dermatological: No rash, lesions/masses Respiratory: No cough, endorses dyspnea Urologic: No hematuria, dysuria Abdominal:   No nausea, vomiting, diarrhea, bright red blood per rectum, melena, or hematemesis, endorses abdominal bloating Neurologic:  No visual changes, wkns, changes in mental status. All other systems reviewed and are otherwise negative except as noted  above.   Physical Exam    VS:  BP (!) 152/82 (BP Location: Left Arm, Patient Position: Sitting, Cuff Size: Normal)   Pulse 74   Ht '5\' 5"'$  (1.651 m)   Wt 197 lb 9.6 oz (89.6 kg)   SpO2 97%   BMI 32.88 kg/m  , BMI Body mass index is 32.88 kg/m.     Vitals:   09/21/22 0801 09/21/22 0815  BP: (!) 144/90 (!) 152/82    GEN: Well nourished, well developed, in no acute distress. HEENT: normal. Neck: Supple, no JVD, carotid bruits, or masses. Cardiac: RRR, no murmurs appreciated but she does have a loud snap of her AVR replacement closing, rubs, or gallops. No clubbing, cyanosis, trace pretibial edema.  Radials/DP/PT 2+ and equal bilaterally.  Respiratory:  Respirations regular and unlabored, clear to auscultation bilaterally. GI: Soft, nontender, nondistended, BS + x 4.   MS: no deformity or atrophy. Skin: warm and dry, no rash. Neuro:  Strength and sensation are intact. Psych: Normal affect.  Accessory Clinical Findings    ECG personally reviewed by me today-sinus rhythm with a rate of 74 with first-degree AV block, left axis deviation, and unifocal PVCs- No acute changes  Lab Results  Component Value Date   WBC 5.6 07/10/2021   HGB 13.9 07/10/2021   HCT 41.3 07/10/2021   MCV 96.2 07/10/2021   PLT 274.0 07/10/2021   Lab Results  Component Value Date   CREATININE 0.82 03/17/2022   BUN 12 03/17/2022   NA 136 03/17/2022   K 4.7 03/17/2022   CL 102 03/17/2022   CO2 27 03/17/2022   Lab Results  Component Value Date   ALT 16 12/03/2021   AST 18 12/03/2021   ALKPHOS 68 12/03/2021   BILITOT 0.5 12/03/2021   Lab Results  Component Value Date   CHOL 182 03/17/2022   HDL 63.30 03/17/2022   LDLCALC 105 (H) 03/17/2022   LDLDIRECT 189.4 10/13/2011   TRIG 70.0 03/17/2022   CHOLHDL 3 03/17/2022    Lab Results  Component Value Date   HGBA1C 5.5 03/17/2022    Assessment & Plan   1.  Chest pain with associated shortness of breath off and on for the past 6 weeks.  Patient  states her pain is midsternal radiates into the left jaw with rest and exertion and she has not been able to determine any alleviating or aggravating factors.  Her last Myoview was completed in 2017 which was considered a low risk study.  So she has been scheduled for a Lexiscan stress test that she states is the only stress  test she is able of having.  She is also being scheduled for an echocardiogram for her shortness of breath status post CABG and AVR.  With her shortness of breath she is also increased her Lasix from as needed to daily as she does complain of peripheral edema and abdominal bloating.  She will be scheduled for a BMP to check her kidney function and electrolytes prior to her return after testing is completed. CBC has been ordered to check for anemia as a cause of her shortness of breath.  2.  Coronary artery disease status post CABG x1 with chronic angina.  Unfortunately her chest discomfort has changed in intensity and frequency and she has been scheduled for stress testing.  She is continue on aspirin 81 mg daily, atorvastatin 20 mg daily, she continues to have Nitrostat as needed, and lisinopril 2.5 mg daily  3.  History of dissection of the thoracic abdominal aorta which continues to be followed by vascular in [place].  Last CT angio of the chest and pelvis was 07/2021 with stable findings.    4.  Chronic diastolic congestive heart failure with complaints of shortness of breath today last echocardiogram was completed in 07/2019 with EF of 45-50%.  She has increased her Lasix to daily from as needed if she has noted swelling and abdominal bloating.  She does not appear extremely volume overloaded today.  We will check labs.  She is also being scheduled for repeat echocardiogram.  She is also continued on carvedilol 6.25 mg twice daily.  5.  Hypertension with blood pressure today 144/90 repeat 152/82.  She typically states that her blood pressure does run lower than her last visit she  was 563 systolic.  She thinks that with the increased amount of discomfort, shortness of breath and distress that she is having at home months with cough and elevated blood pressures.  We are increasing her Lasix from as needed to daily in hopes to help with her blood pressure.  She has been instructed to monitor her pressure at home as she does typically run on the lower side.  She is continued on carvedilol 6.25 mg twice daily and lisinopril 2.5 mg daily  6.  Pure hypercholesterolemia with last lipid panel completed 03/17/2022 with total cholesterol of 182, triglycerides of 70, HDL 63.3, and LDL of 105.  She has been continued for-year-old 500 mg 1 capsule daily atorvastatin 20 mg daily and a new prescription of Zetia 10 mg daily has been sent to pharmacy of choice as patient was previously stated she was started on Zetia but the prescription was not sent and so she has yet to start that.  She will need a repeat lipid and LFT after starting the medication 6-8 weeks. Current lipid panel as been ordered.  7.  Disposition patient return to see MD/APP once testing is completed  Almalik Weissberg, NP 09/21/2022, 8:38 AM

## 2022-09-21 ENCOUNTER — Ambulatory Visit: Payer: TRICARE For Life (TFL) | Attending: Cardiology | Admitting: Cardiology

## 2022-09-21 ENCOUNTER — Encounter (INDEPENDENT_AMBULATORY_CARE_PROVIDER_SITE_OTHER): Payer: Self-pay

## 2022-09-21 ENCOUNTER — Ambulatory Visit (INDEPENDENT_AMBULATORY_CARE_PROVIDER_SITE_OTHER): Payer: Medicare HMO | Admitting: Psychology

## 2022-09-21 ENCOUNTER — Encounter: Payer: Self-pay | Admitting: Cardiology

## 2022-09-21 VITALS — BP 152/82 | HR 74 | Ht 65.0 in | Wt 197.6 lb

## 2022-09-21 DIAGNOSIS — I2510 Atherosclerotic heart disease of native coronary artery without angina pectoris: Secondary | ICD-10-CM

## 2022-09-21 DIAGNOSIS — E7849 Other hyperlipidemia: Secondary | ICD-10-CM | POA: Diagnosis not present

## 2022-09-21 DIAGNOSIS — R079 Chest pain, unspecified: Secondary | ICD-10-CM

## 2022-09-21 DIAGNOSIS — I10 Essential (primary) hypertension: Secondary | ICD-10-CM | POA: Diagnosis not present

## 2022-09-21 DIAGNOSIS — I7103 Dissection of thoracoabdominal aorta: Secondary | ICD-10-CM

## 2022-09-21 DIAGNOSIS — Z79899 Other long term (current) drug therapy: Secondary | ICD-10-CM

## 2022-09-21 DIAGNOSIS — R0602 Shortness of breath: Secondary | ICD-10-CM | POA: Diagnosis not present

## 2022-09-21 DIAGNOSIS — F331 Major depressive disorder, recurrent, moderate: Secondary | ICD-10-CM | POA: Diagnosis not present

## 2022-09-21 DIAGNOSIS — Z952 Presence of prosthetic heart valve: Secondary | ICD-10-CM | POA: Diagnosis not present

## 2022-09-21 MED ORDER — EZETIMIBE 10 MG PO TABS: 10.0000 mg | ORAL_TABLET | Freq: Every day | ORAL | 3 refills | Status: DC

## 2022-09-21 NOTE — Progress Notes (Signed)
[place] Counselor/Therapist Progress Note  Patient ID: Rachel Torres, MRN: [id number],    Date: 09/21/2022  Time Spent: 10:00am-10:55am    55 minutes   Treatment Type: Individual Therapy  Reported Symptoms: stress  Mental Status Exam: Appearance:  Casual     Behavior: Appropriate  Motor: Normal  Speech/Language:  Normal Rate  Affect: Appropriate  Mood: normal  Thought process: normal  Thought content:   WNL  Sensory/Perceptual disturbances:   WNL  Orientation: oriented to person, place, time/date, and situation  Attention: Good  Concentration: Good  Memory: WNL  Fund of knowledge:  Good  Insight:   Good  Judgment:  Good  Impulse Control: Good   Risk Assessment: Danger to Self:  No Self-injurious Behavior: No Danger to Others: No Duty to Warn:no Physical Aggression / Violence:No  Access to Firearms a concern: No  Gang Involvement:No   Subjective:  [name] present for face-to-face individual therapy via video Webex.  Pt consents to telehealth video session due to COVID 19 pandemic. Location of pt: home Location of therapist: home office.   Pt talked about feeling like she is "hanging on by a thread".   Pt saw the cardiologist bc she was having chest pains and she will be having tests. Pt went to [facility] and has gained 35 lbs.   Pt is going to work on weight loss. Pt was tearful as she talked about feeling overwhelmed and stressed.   Pt feels like she needs a day or two by herself.   Addressed how pt can get some respite.   Pt talked about the trip to see her son's military pinning ceremony.  It was a good trip but pt is exhausted now.    Pt talked about her mother.   She gets very frustrated that her mother calls her often and wants to know everything pt is doing.  Helped pt process her feelings and relationship dynamics.   Pt talked about caregiving for Rachel Torres.  His cognitive issues are challenging to deal with. Worked on  self care strategies.   Provided supportive therapy.  Interventions: Cognitive Behavioral Therapy and Insight-Oriented  Diagnosis: F33.1   Plan of Care: Recommend ongoing therapy.   Pt participated in setting treatment goals.   Pt needs a place to talk about stressors.  Plan to meet every two weeks.    Treatment Plan (Treatment Plan Target Date: 03/23/2023) Client Abilities/Strengths  Pt is bright, engaging, and motivated for therapy.   Client Treatment Preferences  Individual therapy.  Client Statement of Needs  Improve coping skills.  Have a place to talk about stressors.   Symptoms  Depressed or irritable mood. Unresolved grief issues.  Problems Addressed  Unipolar Depression Goals 1. Alleviate depressive symptoms and return to previous level of effective functioning. 2. Appropriately grieve the loss in order to normalize mood and to return to previously adaptive level of functioning. Objective Learn and implement behavioral strategies to overcome depression. Target Date: 2023-03-23 Frequency: Biweekly  Progress: 10 Modality: individual  Related Interventions Engage the client in "behavioral activation," increasing his/her activity level and contact with sources of reward, while identifying processes that inhibit activation.  Use behavioral techniques such as instruction, rehearsal, role-playing, role reversal, as needed, to facilitate activity in the client's daily life; reinforce success. Assist the client in developing skills that increase the likelihood of deriving pleasure from behavioral activation (e.g., assertiveness skills, developing an exercise plan, less internal/more external focus, increased social involvement); reinforce  success. Objective Identify important people in life, past and present, and describe the quality, good and poor, of those relationships. Target Date: 2023-03-23 Frequency: Biweekly  Progress: 10 Modality: individual  Related Interventions Conduct  Interpersonal Therapy beginning with the assessment of the client's "interpersonal inventory" of important past and present relationships; develop a case formulation linking depression to grief, interpersonal role disputes, role transitions, and/or interpersonal deficits). Objective Learn and implement problem-solving and decision-making skills. Target Date: 2023-03-23 Frequency: Biweekly  Progress: 10 Modality: individual  Related Interventions Conduct Problem-Solving Therapy using techniques such as psychoeducation, modeling, and role-playing to teach client problem-solving skills (i.e., defining a problem specifically, generating possible solutions, evaluating the pros and cons of each solution, selecting and implementing a plan of action, evaluating the efficacy of the plan, accepting or revising the plan); role-play application of the problem-solving skill to a real life issue. Encourage in the client the development of a positive problem orientation in which problems and solving them are viewed as a natural part of life and not something to be feared, despaired, or avoided. 3. Develop healthy interpersonal relationships that lead to the alleviation and help prevent the relapse of depression. 4. Develop healthy thinking patterns and beliefs about self, others, and the world that lead to the alleviation and help prevent the relapse of depression. 5. Recognize, accept, and cope with feelings of depression. Diagnosis F33.1  Conditions For Discharge Achievement of treatment goals and objectives   Rachel Bolder, LCSW

## 2022-09-21 NOTE — Patient Instructions (Signed)
Medication Instructions:  Your Physician recommend you continue on your current medication as directed.    *If you need a refill on your cardiac medications before your next appointment, please call your pharmacy*   Lab Work: Your physician recommends that you return for lab work (BMP, CBC, Lipid) @ [facility]  If you have labs (blood work) drawn today and your tests are completely normal, you will receive your results only by: MyChart Message (if you have [place]) OR A paper copy in the mail If you have any lab test that is abnormal or we need to change your treatment, we will call you to review the results.   Testing/Procedures: Your physician has requested that you have an echocardiogram. Echocardiography is a painless test that uses sound waves to create images of your heart. It provides your doctor with information about the size and shape of your heart and how well your heart's chambers and valves are working. This procedure takes approximately one hour. There are no restrictions for this procedure. [address]  Your physician has requested that you have a lexiscan myoview. For further information please visit [email or web address]. Please follow instruction sheet, as given. [facility]   Follow-Up: At [name], you and your health needs are our priority.  As part of our continuing mission to provide you with exceptional heart care, we have created designated Provider Care Teams.  These Care Teams include your primary Cardiologist (physician) and Advanced Practice Providers (APPs -  Physician Assistants and Nurse Practitioners) who all work together to provide you with the care you need, when you need it.  We recommend signing up for the patient portal called "MyChart".  Sign up information is provided on this After Visit Summary.  MyChart is used to connect with patients for Virtual Visits (Telemedicine).  Patients are able to view lab/test  results, encounter notes, upcoming appointments, etc.  Non-urgent messages can be sent to your provider as well.   To learn more about what you can do with MyChart, go to [email or web address].    Your next appointment:   1 month(s)  The format for your next appointment:   In Person  Provider:   Gerrie Nordmann, NP    You are scheduled for a Myocardial Perfusion Imaging Study Please arrive 15 minutes prior to your appointment time for registration and insurance purposes.  The test will take approximately 3 to 4 hours to complete; you may bring reading material.  If someone comes with you to your appointment, they will need to remain in the main lobby due to limited space in the testing area. **If you are pregnant or breastfeeding, please notify the nuclear lab prior to your appointment**  How to prepare for your Myocardial Perfusion Test: Do not eat or drink 3 hours prior to your test, except you may have water. Do not consume products containing caffeine (regular or decaffeinated) 12 hours prior to your test. (ex: coffee, chocolate, sodas, tea). Do bring a list of your current medications with you.  If not listed below, you may take your medications as normal. Do wear comfortable clothes (no dresses or overalls) and walking shoes, tennis shoes preferred (No heels or open toe shoes are allowed). Do NOT wear cologne, perfume, aftershave, or lotions (deodorant is allowed). If these instructions are not followed, your test will have to be rescheduled.   If you cannot keep your appointment, please provide 24 hours notification to the Nuclear Lab, to avoid a  possible $50 charge to your account.

## 2022-09-23 ENCOUNTER — Ambulatory Visit: Payer: Medicare HMO | Attending: Cardiology

## 2022-09-23 DIAGNOSIS — Z952 Presence of prosthetic heart valve: Secondary | ICD-10-CM | POA: Diagnosis not present

## 2022-09-23 DIAGNOSIS — R0602 Shortness of breath: Secondary | ICD-10-CM

## 2022-09-23 LAB — ECHOCARDIOGRAM COMPLETE
AR max vel: 2.49 cm2
AV Area VTI: 2.28 cm2
AV Area mean vel: 2.47 cm2
AV Mean grad: 5 mmHg
AV Peak grad: 8.1 mmHg
Ao pk vel: 1.42 m/s
Area-P 1/2: 3.24 cm2
Calc EF: 68.9 %
S' Lateral: 3.5 cm
Single Plane A2C EF: 68.7 %
Single Plane A4C EF: 70.5 %

## 2022-09-24 NOTE — Progress Notes (Signed)
Please let Rachel Torres know that her heart function has improved slightly over the years to 45-50%, some leakage noted to the mitral valve that is unchanged and her aortic valve replacement is functioning well

## 2022-09-27 ENCOUNTER — Encounter (INDEPENDENT_AMBULATORY_CARE_PROVIDER_SITE_OTHER): Payer: Self-pay

## 2022-09-27 ENCOUNTER — Encounter: Payer: Self-pay | Admitting: Family Medicine

## 2022-09-27 ENCOUNTER — Ambulatory Visit (INDEPENDENT_AMBULATORY_CARE_PROVIDER_SITE_OTHER): Payer: Medicare HMO | Admitting: Family Medicine

## 2022-09-27 ENCOUNTER — Telehealth (INDEPENDENT_AMBULATORY_CARE_PROVIDER_SITE_OTHER): Payer: Medicare HMO | Admitting: Psychology

## 2022-09-27 VITALS — BP 138/60 | HR 83 | Temp 98.1°F | Ht 65.0 in | Wt 200.5 lb

## 2022-09-27 DIAGNOSIS — M25512 Pain in left shoulder: Secondary | ICD-10-CM | POA: Diagnosis not present

## 2022-09-27 DIAGNOSIS — M25561 Pain in right knee: Secondary | ICD-10-CM

## 2022-09-27 DIAGNOSIS — M19012 Primary osteoarthritis, left shoulder: Secondary | ICD-10-CM

## 2022-09-27 DIAGNOSIS — G8929 Other chronic pain: Secondary | ICD-10-CM

## 2022-09-27 MED ORDER — TRIAMCINOLONE ACETONIDE 40 MG/ML IJ SUSP
40.0000 mg | Freq: Once | INTRAMUSCULAR | Status: AC
  Administered 2022-09-27: 40 mg via INTRA_ARTICULAR

## 2022-09-27 NOTE — Progress Notes (Unsigned)
Rachel Harbor T. Cheris Tweten, MD, [name], [address]  Phone: [phone]  FAX: [place] - 66 y.o. female  MRN [id number]  Date of Birth: 1956/11/19  Date: 09/27/2022  PCP: [name], MD  Referral: [name], MD  Chief Complaint  Patient presents with   Shoulder Pain    Left   Knee Pain    Right   Subjective:   Rachel Torres is a 66 y.o. very pleasant female patient with Body mass index is 33.36 kg/m. who presents with the following:  She presents today with 2 primary problems, and that is of some left-sided shoulder pain as well as right knee pain.  She does have arthritic changes that are known.  She historically had a right-sided shoulder arthroplasty after trauma.  Left shoulder does have some crepitus, she has had a subacromial injection before by different provider.  This has provided some significant but relief before.  Right now with her left shoulder she is having pain with sleeping and some pain with terminal motion and pain with abduction.  In the right knee also has some crepitus and pain going up and down stairs and rising from a seated position and being on her feet and weightbearing for extended periods of time.  Inj L shoulder combo Inj R knee    Review of Systems is noted in the HPI, as appropriate  Objective:   BP 138/60   Pulse 83   Temp 98.1 F (36.7 C) (Oral)   Ht '5\' 5"'$  (1.651 m)   Wt 200 lb 8 oz (90.9 kg)   SpO2 96%   BMI 33.36 kg/m   GEN: No acute distress; alert,appropriate. PULM: Breathing comfortably in no respiratory distress PSYCH: Normally interactive.   Shoulder: L Inspection: No muscle wasting or winging Ecchymosis/edema: neg  AC joint, scapula, clavicle: NT Cervical spine: NT, full ROM Spurling's: neg Abduction: full, 5/5 Flexion: full, 5/5 IR, full, lift-off: 5/5 ER at neutral: full, 5/5 AC crossover:  neg Neer: pos Hawkins: pos Drop Test: neg Empty Can: pos Supraspinatus insertion: mild-mod T Bicipital groove: NT Speed's: neg Yergason's: neg Sulcus sign: neg Scapular dyskinesis: none C5-T1 intact  Neuro: Sensation intact Grip 5/5   Right knee: Hyperextension by 3 degrees.  Flexion to 130.  She has had pain on the medial joint line significantly.  Stable to varus and valgus stress.  There is to a lesser extent some pain on the lateral joint line.  Lachman is negative.  Drawer testing is negative.  She does have some pain with flexion pinch and McMurray's, but no mechanical symptoms.  Laboratory and Imaging Data:  Assessment and Plan:     ICD-10-CM   1. Chronic left shoulder pain  M25.512 triamcinolone acetonide (KENALOG-40) injection 40 mg   G89.29     2. Chronic pain of right knee  M25.561 triamcinolone acetonide (KENALOG-40) injection 40 mg   G89.29     3. Arthritis of left glenohumeral joint  M19.012      Acute on chronic osteoarthritis with exacerbation.  She is already doing some basic things such as occasional Tylenol and NSAIDs, and I think that she can increase her NSAIDs over the next 10 days to help with her flare.  Aspiration/Injection Procedure Note Rachel Torres 1956/11/22 Date of procedure: 09/27/2022  Procedure: Large Joint Aspiration / Injection of Knee, R Indications: Pain  Procedure Details Patient verbally  consented to procedure. Risks, benefits, and alternatives explained. Sterilely prepped with Chloraprep. Ethyl cholride used for anesthesia. 9 cc Lidocaine 1% mixed with 1 mL of Kenalog 40 mg injected using the anteromedial approach without difficulty. No complications with procedure and tolerated well. Patient had decreased pain post-injection. Medication: 1 mL of Kenalog 40 mg   Aspiration/Injection Procedure Note Rachel Torres 11/25/1956 Date of procedure: 09/27/2022  Procedure: Large Joint Aspiration / Injection of Shoulder,  Intraarticular, L Indications: Pain  Procedure Details Verbal consent was obtained from the patient. Risks explained and contrasted with benefits and alternatives. Patient prepped with Chloraprep and Ethyl Chloride used for anesthesia. An intraarticular shoulder injection was performed using the posterior approach. The patient tolerated the procedure well and had decreased pain post injection. No complications. Injection: 4 cc of Lidocaine 1% and 1/2 mL Kenalog 40 mg. Needle: 21 gauge, 2 inch Medication: 1/2 cc of Kenalog 40 mg (equaling Kenalog 20 mg)  Aspiration/Injection Procedure Note Rachel Torres [birth year] Date of procedure: 09/27/2022  Procedure: Large Joint Aspiration / Injection of Shoulder, Subacromial, L Indications: Pain  Procedure Details Verbal consent was obtained from the patient. Risks, benefits, and alternatives were explained. Patient prepped with Chloraprep and Ethyl Chloride used for anesthesia. The subacromial space was injected using the posterior approach. The patient tolerated the procedure well and had decreased pain post injection. No complications. Injection: 4 cc of Lidocaine 1% and 1/2 mL of Kenalog 40 mg. Needle: 22 gauge, 1 1/2 inch Medication: 1/2 cc of Kenalog 40 mg (equaling Kenalog 20 mg)   Medication Management during today's office visit: Meds ordered this encounter  Medications   triamcinolone acetonide (KENALOG-40) injection 40 mg   triamcinolone acetonide (KENALOG-40) injection 40 mg   Medications Discontinued During This Encounter  Medication Reason   Insulin Pen Needle (BD PEN NEEDLE NANO U/F) 32G X 4 MM MISC Completed Course   Krill Oil (OMEGA-3) 500 MG CAPS Completed Course    Orders placed today for conditions managed today: No orders of the defined types were placed in this encounter.   Disposition: No follow-ups on file.  Dragon [employer] One speech-to-text software was used for transcription in this dictation.  Possible  transcriptional errors can occur using Editor, commissioning.   Signed,  Maud Deed. Bretta Fees, MD   Outpatient Encounter Medications as of 09/27/2022  Medication Sig   acetaminophen (TYLENOL) 500 MG tablet Take 500 mg by mouth every 6 (six) hours as needed.   albuterol (VENTOLIN HFA) 108 (90 Base) MCG/ACT inhaler Inhale 1-2 puffs into the lungs every 6 (six) hours as needed for wheezing or shortness of breath.   ALPRAZolam (XANAX) 0.5 MG tablet Take 1 tablet (0.5 mg total) by mouth daily as needed for anxiety.   aspirin EC 81 MG tablet Take 81 mg by mouth at bedtime.   atorvastatin (LIPITOR) 20 MG tablet Take 1 tablet (20 mg total) by mouth daily. (Patient taking differently: Take 20 mg by mouth. FOUR TIMES A WEEK)   carvedilol (COREG) 6.25 MG tablet TAKE 1 TABLET TWICE DAILY WITH MEALS   Cholecalciferol (VITAMIN D3) 50 MCG (2000 UT) CAPS Take 2,000 capsules by mouth. FOUR TIMES A WEEK   clindamycin (CLEOCIN) 300 MG capsule 300 mg. AS NEEDED FOR DENTAL PROCEDURES   cyclobenzaprine (FLEXERIL) 10 MG tablet Take 0.5-1 tablets (5-10 mg total) by mouth 3 (three) times daily as needed. For headache   ezetimibe (ZETIA) 10 MG tablet Take 1 tablet (10 mg total) by mouth daily.   fluticasone (  FLONASE) 50 MCG/ACT nasal spray Place into both nostrils.   furosemide (LASIX) 20 MG tablet Take 1 tablet (20 mg total) by mouth every other day. And as needed for swelling or shortness of breath (Patient taking differently: Take 20 mg by mouth. 4 times a week)   lisinopril (ZESTRIL) 5 MG tablet TAKE 1/2 TABLET EVERY DAY. (Patient taking differently: TAKE 1/2 TABLET 4x a week)   magnesium gluconate (MAGONATE) 500 MG tablet Take 500 mg by mouth at bedtime.   Melatonin 5 MG SUBL Place under the tongue at bedtime as needed.   Misc Natural Products (TART CHERRY ADVANCED) CAPS Take 1 capsule by mouth daily.   nitroGLYCERIN (NITROSTAT) 0.4 MG SL tablet Place 1 tablet (0.4 mg total) under the tongue every 5 (five) minutes as  needed for chest pain.   omeprazole (PRILOSEC) 40 MG capsule TAKE 1 CAPSULE EVERY DAY   polyethylene glycol powder (GLYCOLAX/MIRALAX) 17 GM/SCOOP powder Take 17 g by mouth as needed.   potassium chloride (KLOR-CON M) 10 MEQ tablet TAKE 1 TABLET (10 MEQ TOTAL) BY MOUTH EVERY OTHER DAY. TAKE WITH LASIX   promethazine (PHENERGAN) 25 MG tablet Take 0.5 tablets (12.5 mg total) by mouth every 8 (eight) hours as needed for nausea.   Simethicone (MYLANTA GAS PO) Take by mouth 3 times/day as needed-between meals & bedtime.   venlafaxine XR (EFFEXOR-XR) 150 MG 24 hr capsule TAKE 1 CAPSULE EVERY DAY WITH BREAKFAST   venlafaxine XR (EFFEXOR-XR) 37.5 MG 24 hr capsule TAKE 1 CAPSULE (37.5 MG TOTAL) BY MOUTH DAILY WITH BREAKFAST.   [DISCONTINUED] Insulin Pen Needle (BD PEN NEEDLE NANO U/F) 32G X 4 MM MISC Use Nano needle with Ozempic   [DISCONTINUED] Krill Oil (OMEGA-3) 500 MG CAPS Take 1 capsule by mouth daily.   [EXPIRED] triamcinolone acetonide (KENALOG-40) injection 40 mg    [EXPIRED] triamcinolone acetonide (KENALOG-40) injection 40 mg    No facility-administered encounter medications on file as of 09/27/2022.

## 2022-09-28 ENCOUNTER — Telehealth (INDEPENDENT_AMBULATORY_CARE_PROVIDER_SITE_OTHER): Payer: Medicare HMO | Admitting: Psychology

## 2022-09-28 DIAGNOSIS — F4320 Adjustment disorder, unspecified: Secondary | ICD-10-CM | POA: Diagnosis not present

## 2022-09-28 DIAGNOSIS — F5089 Other specified eating disorder: Secondary | ICD-10-CM | POA: Diagnosis not present

## 2022-09-28 NOTE — Progress Notes (Signed)
  Office: [phone]  /  Fax: [phone]    Date: September 28, 2022    Appointment Start Time: 8:00am Duration: 36 minutes Provider: Glennie Isle, Psy.D. Type of Session: Individual Therapy  Location of Patient: Home (private location) Location of Provider: Provider's Home (private office) Type of Contact: Telepsychological Visit via MyChart Video Visit  Session Content: Rachel Torres is a 66 y.o. female presenting for a follow-up appointment to re-establish care. Today's appointment was a telepsychological visit. Rachel Torres provided verbal consent for today's telepsychological appointment and she is aware she is responsible for securing confidentiality on her end of the session. Prior to proceeding with today's appointment, Rachel Torres's physical location at the time of this appointment was obtained as well a phone number she could be reached at in the event of technical difficulties. Rachel Torres and this provider participated in today's telepsychological service.   This provider conducted a brief check-in. Rachel Torres shared about significant events since her last visit with this provider, including ongoing concerns regarding her husband's health. Further explored and processed. She indicated she gained 40 pounds in the past three months and acknowledged engagement in emotional eating behaviors. Given the circumstances, reviewed self-compassion that was previously discussed, including its impact on eating habits. She was engaged in a self- compassion exercise during the appointment. Overall, Rachel Torres was receptive to today's appointment as evidenced by openness to sharing, responsiveness to feedback, and willingness to work toward increasing self-compassion.  Mental Status Examination:  Appearance: neat Behavior: appropriate to circumstances Mood: sad Affect: mood congruent; tearful  Speech: WNL Eye Contact: appropriate Psychomotor Activity: unable to assess  Gait: unable to assess Thought Process: linear,  logical, and goal directed and no evidence or endorsement of suicidal, homicidal, and self-harm ideation, plan and intent  Thought Content/Perception: no hallucinations, delusions, bizarre thinking or behavior endorsed or observed Orientation: AAOx4 Memory/Concentration: memory, attention, language, and fund of knowledge intact  Insight: fair Judgment: fair  Interventions:  Conducted a brief chart review Provided empathic reflections and validation Employed supportive psychotherapy interventions to facilitate reduced distress and to improve coping skills with identified stressors Psychoeducation provided regarding self-compassion Engaged pt in a self-compassion exercise  DSM-5 Diagnosis(es):  F50.89 Other Specified Feeding or Eating Disorder, Emotional Eating Behaviors and F43.20 Adjustment Disorder, Unspecified   Treatment Goal & Progress: During the initial appointment with this provider (12/07/2021), the following treatment goal was established: increase coping skills. The same goal was established during today's appointment, as [name] re-established care with this provider.  Plan: The next appointment is scheduled for 10/04/2022 at 4pm, which will be via MyChart Video Visit. The next session will focus on working towards the established treatment goal. Rachel Torres will continue with her primary therapist and support group at the [place].

## 2022-09-30 ENCOUNTER — Encounter
Admission: RE | Admit: 2022-09-30 | Discharge: 2022-09-30 | Disposition: A | Discharge status: Home / Self Care | Payer: Medicare HMO | Source: Ambulatory Visit | Attending: Cardiology | Admitting: Cardiology

## 2022-09-30 DIAGNOSIS — R079 Chest pain, unspecified: Secondary | ICD-10-CM

## 2022-09-30 MED ORDER — TECHNETIUM TC 99M TETROFOSMIN IV KIT
31.0100 | PACK | Freq: Once | INTRAVENOUS | Status: AC | PRN
  Administered 2022-09-30: 31.01 via INTRAVENOUS

## 2022-09-30 MED ORDER — REGADENOSON 0.4 MG/5ML IV SOLN
0.4000 mg | Freq: Once | INTRAVENOUS | Status: AC
  Administered 2022-09-30: 0.4 mg via INTRAVENOUS
  Filled 2022-09-30: qty 5

## 2022-09-30 MED ORDER — TECHNETIUM TC 99M TETROFOSMIN IV KIT
10.9300 | PACK | Freq: Once | INTRAVENOUS | Status: AC | PRN
  Administered 2022-09-30: 10.93 via INTRAVENOUS

## 2022-10-01 LAB — NM MYOCAR MULTI W/SPECT W/WALL MOTION / EF
Estimated workload: 1
Exercise duration (min): 0 min
Exercise duration (sec): 0 s
LV dias vol: 93 mL (ref 46–106)
LV sys vol: 52 mL
MPHR: 156 {beats}/min
Nuc Stress EF: 44 %
Peak HR: 94 {beats}/min
Percent HR: 60 %
Rest HR: 67 {beats}/min
Rest Nuclear Isotope Dose: 10.9 mCi
SDS: 0
SRS: 11
SSS: 9
ST Depression (mm): 0 mm
Stress Nuclear Isotope Dose: 31 mCi
TID: 0.95

## 2022-10-04 ENCOUNTER — Telehealth (INDEPENDENT_AMBULATORY_CARE_PROVIDER_SITE_OTHER): Payer: Medicare HMO | Admitting: Psychology

## 2022-10-04 DIAGNOSIS — F5089 Other specified eating disorder: Secondary | ICD-10-CM | POA: Diagnosis not present

## 2022-10-04 DIAGNOSIS — F4320 Adjustment disorder, unspecified: Secondary | ICD-10-CM

## 2022-10-04 NOTE — Progress Notes (Signed)
  Office: [phone]  /  Fax: [phone]    Date: October 04, 2022    Appointment Start Time: 4:04pm Duration: 28 minutes Provider: Glennie Isle, Psy.D. Type of Session: Individual Therapy  Location of Patient: Home (private location) Location of Provider: Provider's Home (private office) Type of Contact: Telepsychological Visit via MyChart Video Visit  Session Content: This provider called Rachel Torres at 4:03pm as she did not present for today's appointment. Assistance was provided. As such, today's appointment was initiated 4 minutes late.Media is a 66 y.o. female presenting for a follow-up appointment to address the previously established treatment goal of increasing coping skills.Today's appointment was a telepsychological visit. Rachel Torres provided verbal consent for today's telepsychological appointment and she is aware she is responsible for securing confidentiality on her end of the session. Prior to proceeding with today's appointment, Rachel Torres's physical location at the time of this appointment was obtained as well a phone number she could be reached at in the event of technical difficulties. Rachel Torres and this provider participated in today's telepsychological service. Of note, today's appointment was switched to a regular telephone call at 4:09pm with [name]'s verbal consent due to technical issues.   This provider conducted a brief check-in. [name] shared about recent events. She added, "I'm still eating more than I should. I cut it back some." Eating habits were further explored. She was engaged in problem solving to help her make better choices and engage in portion control (e.g., have easy access to protein bars, yogurts, jerky, and other high protein snacks/foods). Additionally, this provider recommended Rachel Torres have designated areas in the refrigerator, freezer, and pantry for foods that fit her goal(s) for easy access and to help ensure she is making better choices. She was receptive  as evidenced by her stating, "That's a great idea." Moreover, this provider reviewed emotional and physical hunger. Psychoeducation regarding the hunger and satisfaction scale was provided. [name] provided verbal consent during today's appointment for this provider to send handouts about emotional versus physical hunger and scale via e-mail. Overall, Rachel Torres was receptive to today's appointment as evidenced by openness to sharing, responsiveness to feedback, and willingness to implement discussed strategies .  Mental Status Examination:  Appearance: neat Behavior: appropriate to circumstances Mood: neutral Affect: mood congruent Speech: WNL Eye Contact: appropriate Psychomotor Activity: WNL Gait: unable to assess Thought Process: linear, logical, and goal directed and no evidence or endorsement of suicidal, homicidal, and self-harm ideation, plan and intent  Thought Content/Perception: no hallucinations, delusions, bizarre thinking or behavior endorsed or observed Orientation: AAOx4 Memory/Concentration: memory, attention, language, and fund of knowledge intact  Insight: fair Judgment: fair  Interventions:  Conducted a brief chart review Provided empathic reflections and validation Employed supportive psychotherapy interventions to facilitate reduced distress and to improve coping skills with identified stressors Engaged patient in problem solving Reviewed emotional and physical hunger characteristics  Psychoeducation provided regarding the hunger and satisfaction scale  DSM-5 Diagnosis(es):  F50.89 Other Specified Feeding or Eating Disorder, Emotional Eating Behaviors and F43.20 Adjustment Disorder, Unspecified   Treatment Goal & Progress: During the initial appointment with this provider, the following treatment goal was established: increase coping skills. Progress is limited, as Rachel Torres has just re-established care with this provider, however, she is receptive to the interaction and  interventions.  Plan: The next appointment is scheduled for 10/18/2022 at 8am, which will be via [place] Visit. The next session will focus on working towards the established treatment goal.

## 2022-10-05 ENCOUNTER — Ambulatory Visit (INDEPENDENT_AMBULATORY_CARE_PROVIDER_SITE_OTHER): Payer: Medicare HMO | Admitting: Psychology

## 2022-10-05 DIAGNOSIS — F331 Major depressive disorder, recurrent, moderate: Secondary | ICD-10-CM

## 2022-10-05 NOTE — Progress Notes (Signed)
[place] Counselor/Therapist Progress Note  Patient ID: Rachel Torres, MRN: [id number],    Date: 10/05/2022  Time Spent: 10:00am-10:55am    55 minutes   Treatment Type: Individual Therapy  Reported Symptoms: stress  Mental Status Exam: Appearance:  Casual     Behavior: Appropriate  Motor: Normal  Speech/Language:  Normal Rate  Affect: Appropriate  Mood: normal  Thought process: normal  Thought content:   WNL  Sensory/Perceptual disturbances:   WNL  Orientation: oriented to person, place, time/date, and situation  Attention: Good  Concentration: Good  Memory: WNL  Fund of knowledge:  Good  Insight:   Good  Judgment:  Good  Impulse Control: Good   Risk Assessment: Danger to Self:  No Self-injurious Behavior: No Danger to Others: No Duty to Warn:no Physical Aggression / Violence:No  Access to Firearms a concern: No  Gang Involvement:No   Subjective:  [name] present for face-to-face individual therapy via video Webex.  Pt consents to telehealth video session due to COVID 19 pandemic. Location of pt: home Location of therapist: home office.   Pt talked about falling this past week.  She is ok but got banged up a little.   Pt talked about caregiving for [name].  His cognitive issues are challenging to deal with.  Addressed the challenges.  Worked on problem solving and stress management.   Addressed how pt can get additional respite.   Pt is going to [facility] and is trying to make healthier choices.   Worked on self care strategies.   Provided supportive therapy.  Interventions: Cognitive Behavioral Therapy and Insight-Oriented  Diagnosis: F33.1   Plan of Care: Recommend ongoing therapy.   Pt participated in setting treatment goals.   Pt needs a place to talk about stressors.  Plan to meet every two weeks.    Treatment Plan (Treatment Plan Target Date: 03/23/2023) Client Abilities/Strengths  Pt is bright,  engaging, and motivated for therapy.   Client Treatment Preferences  Individual therapy.  Client Statement of Needs  Improve coping skills.  Have a place to talk about stressors.   Symptoms  Depressed or irritable mood. Unresolved grief issues.  Problems Addressed  Unipolar Depression Goals 1. Alleviate depressive symptoms and return to previous level of effective functioning. 2. Appropriately grieve the loss in order to normalize mood and to return to previously adaptive level of functioning. Objective Learn and implement behavioral strategies to overcome depression. Target Date: 2023-03-23 Frequency: Biweekly  Progress: 10 Modality: individual  Related Interventions Engage the client in "behavioral activation," increasing his/her activity level and contact with sources of reward, while identifying processes that inhibit activation.  Use behavioral techniques such as instruction, rehearsal, role-playing, role reversal, as needed, to facilitate activity in the client's daily life; reinforce success. Assist the client in developing skills that increase the likelihood of deriving pleasure from behavioral activation (e.g., assertiveness skills, developing an exercise plan, less internal/more external focus, increased social involvement); reinforce success. Objective Identify important people in life, past and present, and describe the quality, good and poor, of those relationships. Target Date: 2023-03-23 Frequency: Biweekly  Progress: 10 Modality: individual  Related Interventions Conduct Interpersonal Therapy beginning with the assessment of the client's "interpersonal inventory" of important past and present relationships; develop a case formulation linking depression to grief, interpersonal role disputes, role transitions, and/or interpersonal deficits). Objective Learn and implement problem-solving and decision-making skills. Target Date: 2023-03-23 Frequency: Biweekly  Progress: 10  Modality: individual  Related Interventions  Conduct Problem-Solving Therapy using techniques such as psychoeducation, modeling, and role-playing to teach client problem-solving skills (i.e., defining a problem specifically, generating possible solutions, evaluating the pros and cons of each solution, selecting and implementing a plan of action, evaluating the efficacy of the plan, accepting or revising the plan); role-play application of the problem-solving skill to a real life issue. Encourage in the client the development of a positive problem orientation in which problems and solving them are viewed as a natural part of life and not something to be feared, despaired, or avoided. 3. Develop healthy interpersonal relationships that lead to the alleviation and help prevent the relapse of depression. 4. Develop healthy thinking patterns and beliefs about self, others, and the world that lead to the alleviation and help prevent the relapse of depression. 5. Recognize, accept, and cope with feelings of depression. Diagnosis F33.1  Conditions For Discharge Achievement of treatment goals and objectives   [name], LCSW

## 2022-10-06 ENCOUNTER — Other Ambulatory Visit: Payer: Self-pay | Admitting: Cardiovascular Disease

## 2022-10-18 ENCOUNTER — Telehealth (INDEPENDENT_AMBULATORY_CARE_PROVIDER_SITE_OTHER): Payer: Medicare HMO | Admitting: Psychology

## 2022-10-18 DIAGNOSIS — F4320 Adjustment disorder, unspecified: Secondary | ICD-10-CM | POA: Diagnosis not present

## 2022-10-18 DIAGNOSIS — F5089 Other specified eating disorder: Secondary | ICD-10-CM | POA: Diagnosis not present

## 2022-10-18 NOTE — Progress Notes (Signed)
  Office: [phone]  /  Fax: [phone]    Date: October 18, 2022    Appointment Start Time: 8:02am Duration: 30 minutes Provider: Glennie Isle, Psy.D. Type of Session: Individual Therapy  Location of Patient: Home (private location) Location of Provider: Provider's Home (private office) Type of Contact: Telepsychological Visit via MyChart Video Visit  Session Content: Rachel Torres is a 66 y.o. female presenting for a follow-up appointment to address the previously established treatment goal of increasing coping skills.Today's appointment was a telepsychological visit. Sapna provided verbal consent for today's telepsychological appointment and she is aware she is responsible for securing confidentiality on her end of the session. Prior to proceeding with today's appointment, [name]'s physical location at the time of this appointment was obtained as well a phone number she could be reached at in the event of technical difficulties. [name] and this provider participated in today's telepsychological service.   This provider conducted a brief check-in. [name] shared about recent events, including attending a funeral. She indicated ongoing stressors and her recent birthday impacted her eating habits. Further explored and processed. Reviewed all or nothing thinking. Remainder of today's appointment focused on building discrepancy by exploring what would happen in different areas of her life if she were to continue eating the way she is and what would happen if she were to re-commit to her lifestyle change. Notably, she discussed focusing on crafts, adding, "Then I don't snack." Overall, [name] was receptive to today's appointment as evidenced by openness to sharing and responsiveness to feedback.  Mental Status Examination:  Appearance: neat Behavior: appropriate to circumstances Mood: sad Affect: mood congruent and tearful Speech: WNL Eye Contact: appropriate Psychomotor Activity:  WNL Gait: unable to assess Thought Process: linear, logical, and goal directed and no evidence or endorsement of suicidal, homicidal, and self-harm ideation, plan and intent  Thought Content/Perception: no hallucinations, delusions, bizarre thinking or behavior endorsed or observed Orientation: AAOx4 Memory/Concentration: memory, attention, language, and fund of knowledge intact  Insight: fair Judgment: fair  Interventions:  Conducted a brief chart review Provided empathic reflections and validation Provided positive reinforcement Employed supportive psychotherapy interventions to facilitate reduced distress and to improve coping skills with identified stressors Employed motivational interviewing skills to assess patient's willingness/desire to adhere to recommended medical treatments and assignments  DSM-5 Diagnosis(es):  F50.89 Other Specified Feeding or Eating Disorder, Emotional Eating Behaviors and F43.20 Adjustment Disorder, Unspecified   Treatment Goal & Progress: During the initial appointment with this provider, the following treatment goal was established: increase coping skills. [name] has demonstrated progress in her goal as evidenced by willingness to remind herself of all or nothing thinking and utilizing the hunger and satisfaction scale.   Plan: The next appointment is scheduled for 11/08/2022 at 8am, which will be via [facility] Visit. The next session will focus on working towards the established treatment goal.

## 2022-10-19 ENCOUNTER — Ambulatory Visit (INDEPENDENT_AMBULATORY_CARE_PROVIDER_SITE_OTHER): Payer: Medicare HMO | Admitting: Psychology

## 2022-10-19 DIAGNOSIS — F331 Major depressive disorder, recurrent, moderate: Secondary | ICD-10-CM | POA: Diagnosis not present

## 2022-10-19 NOTE — Progress Notes (Signed)
[place] Counselor/Therapist Progress Note  Patient ID: MADHAVI HAMBLEN, MRN: [id number],    Date: 10/19/2022  Time Spent: 10:00am-10:55am    55 minutes   Treatment Type: Individual Therapy  Reported Symptoms: stress  Mental Status Exam: Appearance:  Casual     Behavior: Appropriate  Motor: Normal  Speech/Language:  Normal Rate  Affect: Appropriate  Mood: normal  Thought process: normal  Thought content:   WNL  Sensory/Perceptual disturbances:   WNL  Orientation: oriented to person, place, time/date, and situation  Attention: Good  Concentration: Good  Memory: WNL  Fund of knowledge:  Good  Insight:   Good  Judgment:  Good  Impulse Control: Good   Risk Assessment: Danger to Self:  No Self-injurious Behavior: No Danger to Others: No Duty to Warn:no Physical Aggression / Violence:No  Access to Firearms a concern: No  Gang Involvement:No   Subjective:  [name] present for face-to-face individual therapy via video Webex.  Pt consents to telehealth video session due to COVID 19 pandemic. Location of pt: home Location of therapist: home office.   Pt talked about having her 66th birthday.  [name] had a bad parkinson's day and slept most of the day so pt had to celebrate her birthday on her own.   Addressed her disappointment.   Pt talked about the leader of [name] parkinson's group dying recently.  Pt states this was a hard loss.  Helped pt process her feelings and grief.   Pt talked about caregiving for [name].  His cognitive issues are challenging to deal with.  Addressed the challenges.  Worked on problem solving and stress management.   Addressed how pt can get additional respite.   Worked on self care strategies.   Provided supportive therapy.  Interventions: Cognitive Behavioral Therapy and Insight-Oriented  Diagnosis: F33.1   Plan of Care: Recommend ongoing therapy.   Pt participated in setting treatment goals.   Pt needs a place to talk about  stressors.  Plan to meet every two weeks.    Treatment Plan (Treatment Plan Target Date: 03/23/2023) Client Abilities/Strengths  Pt is bright, engaging, and motivated for therapy.   Client Treatment Preferences  Individual therapy.  Client Statement of Needs  Improve coping skills.  Have a place to talk about stressors.   Symptoms  Depressed or irritable mood. Unresolved grief issues.  Problems Addressed  Unipolar Depression Goals 1. Alleviate depressive symptoms and return to previous level of effective functioning. 2. Appropriately grieve the loss in order to normalize mood and to return to previously adaptive level of functioning. Objective Learn and implement behavioral strategies to overcome depression. Target Date: 2023-03-23 Frequency: Biweekly  Progress: 10 Modality: individual  Related Interventions Engage the client in "behavioral activation," increasing his/her activity level and contact with sources of reward, while identifying processes that inhibit activation.  Use behavioral techniques such as instruction, rehearsal, role-playing, role reversal, as needed, to facilitate activity in the client's daily life; reinforce success. Assist the client in developing skills that increase the likelihood of deriving pleasure from behavioral activation (e.g., assertiveness skills, developing an exercise plan, less internal/more external focus, increased social involvement); reinforce success. Objective Identify important people in life, past and present, and describe the quality, good and poor, of those relationships. Target Date: 2023-03-23 Frequency: Biweekly  Progress: 10 Modality: individual  Related Interventions Conduct Interpersonal Therapy beginning with the assessment of the client's "interpersonal inventory" of important past and present relationships; develop a case formulation linking depression to grief, interpersonal role  disputes, role transitions, and/or interpersonal  deficits). Objective Learn and implement problem-solving and decision-making skills. Target Date: 2023-03-23 Frequency: Biweekly  Progress: 10 Modality: individual  Related Interventions Conduct Problem-Solving Therapy using techniques such as psychoeducation, modeling, and role-playing to teach client problem-solving skills (i.e., defining a problem specifically, generating possible solutions, evaluating the pros and cons of each solution, selecting and implementing a plan of action, evaluating the efficacy of the plan, accepting or revising the plan); role-play application of the problem-solving skill to a real life issue. Encourage in the client the development of a positive problem orientation in which problems and solving them are viewed as a natural part of life and not something to be feared, despaired, or avoided. 3. Develop healthy interpersonal relationships that lead to the alleviation and help prevent the relapse of depression. 4. Develop healthy thinking patterns and beliefs about self, others, and the world that lead to the alleviation and help prevent the relapse of depression. 5. Recognize, accept, and cope with feelings of depression. Diagnosis F33.1  Conditions For Discharge Achievement of treatment goals and objectives   [name], LCSW

## 2022-10-24 NOTE — Progress Notes (Unsigned)
Cardiology Clinic Note   Patient Name: Rachel Torres Date of Encounter: 10/25/2022  Primary Care Provider:  [name], MD Primary Cardiologist:  [name], MD  Patient Profile    66 year old female with past medical history of aortic type a dissection, aortic valve replacement, CAD status post CABG, chronic back pain, chronic shortness of breath, hypertension, hyperlipidemia, Marfan syndrome, who is here today for follow-up on her CAD.  Past Medical History    Past Medical History:  Diagnosis Date   AAA (abdominal aortic aneurysm) ([place])    a. Chronic w/o evidence of aneurysmal dil on CTA 01/2016.   Alcohol abuse    Allergy    Anemia    Anxiety    Asthma    CAD (coronary artery disease)    a. 08/2010 s/p CABG x 1 (VG->RCA) @ time of Ao dissection repair; b. 01/2016 Lexiscan MV: mid antsept/apical defect w/ ? peri-infarct ischemia-->likely attenuation-->Med Rx.   Chewing difficulty    Chronic combined systolic (congestive) and diastolic (congestive) heart failure ([place])    a. 2012 EF 30-35%; b. 04/2015 EF 40-45%; c. 01/2016 Echo: Ef 55-65%, nl AoV bioprosthesis, nl RV, nl PASP.   Constipation    Depression    Edema of both lower extremities    Gallbladder sludge    GERD (gastroesophageal reflux disease)    Heart failure (HCC)    Heart valve problem    History of Bicuspid Aortic Valve    a. 08/2010 s/p AVR @ time of Ao dissection repair; b. 01/2016 Echo: Ef 55-65%, nl AoV bioprosthesis, nl RV, nl PASP.   Hx of repair of dissecting thoracic aortic aneurysm, [place] type A    a. 08/2010 s/p repair with AVR and VG->RCA; b. 01/2016 CTA: stable appearance of Asc Thoracic Aortic graft. Opacification of flase lumen of chronic abd Ao dissection w/ retrograde flow through lumbar arteries. No aneurysmal dil of flase lumen.   Hyperlipidemia    Hypertension    Hypertensive heart disease    IBS (irritable bowel syndrome)    Internal hemorrhoids    Kidney problem    Marfan  syndrome    pt denies   Migraine    Obstructive sleep apnea    Osteoarthritis    Osteoporosis    Palpitations    Pneumonia    RA (rheumatoid arthritis) ([place])    a. Followed by Dr. [name]   Past Surgical History:  Procedure Laterality Date   ABDOMINAL AORTIC ANEURYSM REPAIR     CARDIAC CATHETERIZATION  2001   [place]     ESOPHAGOGASTRODUODENOSCOPY (EGD) WITH PROPOFOL N/A 05/08/2020   Procedure: ESOPHAGOGASTRODUODENOSCOPY (EGD) WITH PROPOFOL;  Surgeon: Virgel Manifold, MD;  Location: [facility] ENDOSCOPY;  Service: Endoscopy;  Laterality: N/A;   FRACTURE SURGERY Right    shoulder replacement   HEMORRHOID BANDING     ORIF DISTAL RADIUS FRACTURE Left    UPPER GASTROINTESTINAL ENDOSCOPY      Allergies  Allergies  Allergen Reactions   Fosamax [Alendronate Sodium]     GI upset    Ginzing [Ginseng] Other (See Comments)    Hyper and jitery    Hydrocod Poli-Chlorphe Poli Er Itching   Hydrocodone-Acetaminophen Itching   Imitrex [Sumatriptan]     Contraindicated w/ heart condition   Oxycodone Itching   Peanut-Containing Drug Products Other (See Comments)    Reaction:  Migraines    Tramadol Other (See Comments)  itching   Hydrocodone-Guaifenesin Itching   Prednisone Rash   Tape Itching    History of Present Illness    Rachel Torres is a 66 year old female with a past medical history of aortic type B dissection September 2011 who underwent aortic valve for bicuspid aortic valve at [facility] as well as CABG x 1 with SVG to the RCA, echo August 2020 with LVEF 45-50%, chronic back pain, hypertension, hyperlipidemia, Marfan syndrome, and OSA who is not currently on CPAP.  She had several evaluations by the emergency department for chest pain. Cardiac enzymes were negative. EKG's unchanged. She underwent a CT scan dates 06/20/22.  She was transferred to [facility] given her complicated anatomy and images were evaluated by vascular  surgery and it was determined there was no significant progression of her disease.  While she was at [facility] a cardiac work-up with echocardiogram and PET stress was completed.  The CT of the chest showed stable descending aorta repair with a type B aortic dissection with opacification of the false lumen at the level of the renal arteries via a lumbar artery.  She was last seen in clinic 07/08/2022 with complaints of chest discomfort that was a pressure that started midsternal and radiated into her left jaw happen with rest or exertion without aggravating or alleviating factors.  She is under an increased amount of stress caring for her husband with dementia and Parkinson's disease.  She is also noted to have elevated blood pressures.  She was scheduled for Lexiscan stress testing and repeat echocardiogram at that time.  Echocardiogram completed revealed LVEF 45-50%, there was global hypokinesis, G2 DD, mild to moderate mitral regurgitation and borderline dilatation of the aortic root measuring 36 mm.  MPI showed no significant ischemia, normal wall motion, EF estimated at 45%, low risk scan.  She returns to clinic today stating that she has been doing fairly well.  Some of the stress has improved with her husband being approved for his [employer] benefits.  She still continues to have some occasional nagging chest discomfort but nothing that is required her to take Nitrostat.  States that her breathing has improved with occasional shortness of breath.  Denies any hospitalizations or visits to the emergency department.  Home Medications    Current Outpatient Medications  Medication Sig Dispense Refill   acetaminophen (TYLENOL) 500 MG tablet Take 500 mg by mouth every 6 (six) hours as needed.     albuterol (VENTOLIN HFA) 108 (90 Base) MCG/ACT inhaler Inhale 1-2 puffs into the lungs every 6 (six) hours as needed for wheezing or shortness of breath. 3 each 3   ALPRAZolam (XANAX) 0.5 MG tablet Take 1 tablet (0.5 mg  total) by mouth daily as needed for anxiety. 30 tablet 0   aspirin EC 81 MG tablet Take 81 mg by mouth at bedtime.     atorvastatin (LIPITOR) 20 MG tablet Take 20 mg by mouth. Four days per week     carvedilol (COREG) 6.25 MG tablet TAKE 1 TABLET TWICE DAILY WITH MEALS 180 tablet 3   Cholecalciferol (VITAMIN D3) 50 MCG (2000 UT) CAPS Take 2,000 capsules by mouth. FOUR TIMES A WEEK     clindamycin (CLEOCIN) 300 MG capsule 300 mg. AS NEEDED FOR DENTAL PROCEDURES     cyclobenzaprine (FLEXERIL) 10 MG tablet Take 0.5-1 tablets (5-10 mg total) by mouth 3 (three) times daily as needed. For headache 90 tablet 3   ezetimibe (ZETIA) 10 MG tablet Take 1 tablet (10 mg total) by mouth  daily. 90 tablet 3   fluticasone (FLONASE) 50 MCG/ACT nasal spray Place into both nostrils.     furosemide (LASIX) 20 MG tablet TAKE 1 TABLET (20 MG TOTAL) BY MOUTH EVERY OTHER DAY. AND AS NEEDED FOR SWELLING OR SHORTNESS OF BREATH 55 tablet 0   isosorbide mononitrate (IMDUR) 30 MG 24 hr tablet Take 0.5 tablets (15 mg total) by mouth daily. 45 tablet 3   lisinopril (ZESTRIL) 5 MG tablet Take 2.5 mg by mouth every other day.     magnesium gluconate (MAGONATE) 500 MG tablet Take 500 mg by mouth at bedtime.     Melatonin 5 MG SUBL Place under the tongue at bedtime as needed.     nitroGLYCERIN (NITROSTAT) 0.4 MG SL tablet Place 1 tablet (0.4 mg total) under the tongue every 5 (five) minutes as needed for chest pain. 25 tablet 3   omeprazole (PRILOSEC) 40 MG capsule TAKE 1 CAPSULE EVERY DAY 90 capsule 3   polyethylene glycol powder (GLYCOLAX/MIRALAX) 17 GM/SCOOP powder Take 17 g by mouth as needed. 255 g 0   potassium chloride (KLOR-CON M) 10 MEQ tablet TAKE 1 TABLET (10 MEQ TOTAL) BY MOUTH EVERY OTHER DAY. TAKE WITH LASIX 45 tablet 3   promethazine (PHENERGAN) 25 MG tablet Take 0.5 tablets (12.5 mg total) by mouth every 8 (eight) hours as needed for nausea. 90 tablet 3   Simethicone (MYLANTA GAS PO) Take by mouth 3 times/day as  needed-between meals & bedtime.     TART CHERRY PO Take 1 capsule by mouth 2 (two) times daily.     venlafaxine XR (EFFEXOR-XR) 150 MG 24 hr capsule TAKE 1 CAPSULE EVERY DAY WITH BREAKFAST 7 capsule 0   venlafaxine XR (EFFEXOR-XR) 37.5 MG 24 hr capsule TAKE 1 CAPSULE (37.5 MG TOTAL) BY MOUTH DAILY WITH BREAKFAST. 90 capsule 2   No current facility-administered medications for this visit.     Family History    Family History  Problem Relation Age of Onset   Hypertension Mother    Depression Mother    Osteoporosis Mother    Stroke Mother    Irritable bowel syndrome Mother    Asthma Mother    Heart disease Mother    Thyroid disease Mother    Anxiety disorder Mother    Other Father 71   Arthritis Sister    Diabetes Sister    Heart disease Sister    Asthma Sister    Migraines Sister    Diabetes Sister    Nephrolithiasis Sister    Osteoporosis Sister    Arthritis Sister    Asthma Sister    Migraines Sister    Diabetes Brother    Other Brother        prediabeties   Asthma Brother    Migraines Brother    Stroke Maternal Grandmother    Colon cancer Maternal Grandmother    Lymphoma Maternal Grandmother    Migraines Maternal Grandmother    Lung cancer Maternal Grandfather    Melanoma Maternal Grandfather    Aneurysm Paternal Grandmother        brain   Diabetes Paternal Grandmother    Arthritis Paternal Grandmother    Arthritis Paternal Grandfather    Diabetes Paternal Grandfather    She indicated that her mother is alive. She indicated that her father is deceased. She indicated that both of her sisters are alive. She indicated that her brother is alive. She indicated that her maternal grandmother is deceased. She indicated that her maternal grandfather is  deceased. She indicated that her paternal grandmother is deceased. She indicated that her paternal grandfather is deceased. She indicated that both of her maternal uncles are deceased.  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    Employer: [employer]  Tobacco Use   Smoking status: Former    Packs/day: 0.75    Years: 1.50    Total pack years: 1.13    Types: Cigarettes    Quit date: 12/20/1978    Years since quitting: 43.8   Smokeless tobacco: Never  Vaping Use   Vaping Use: Never used  Substance and Sexual Activity   Alcohol use: Yes    Comment: 2-3 glasses of red wine per month   Drug use: No   Sexual activity: Never  Other Topics Concern   Not on file  Social History Narrative    married for the second time, happily   On disability after dissection of aortic aneurysm   Husband has Parkinson's - deteriorating w/ dementia   4-6 caffeinated beverages daily   01/25/2018         Social Determinants of Health   Financial Resource Strain: Low Risk  (10/29/2021)   Overall Financial Resource Strain (CARDIA)    Difficulty of Paying Living Expenses: Not hard at all  Food Insecurity: No Food Insecurity (10/29/2021)   Hunger Vital Sign    Worried About Running Out of Food in the Last Year: Never true    Ran Out of Food in the Last Year: Never true  Transportation Needs: No Transportation Needs (10/29/2021)   PRAPARE - Hydrologist (Medical): No    Lack of Transportation (Non-Medical): No  Physical Activity: Inactive (10/29/2021)   Exercise Vital Sign    Days of Exercise per Week: 0 days    Minutes of Exercise per Session: 0 min  Stress: No Stress Concern Present (10/29/2021)   [place]    Feeling of Stress : Not at all  Social Connections: [place] (10/29/2021)   Social Connection and Isolation Panel [NHANES]    Frequency of Communication with Friends and Family: More than  three times a week    Frequency of Social Gatherings with Friends and Family: Twice a week    Attends Religious Services: More than 4 times per year    Active Member of [employer] or Organizations: Yes    Attends Music therapist: More than 4 times per year    Marital Status: Married  Human resources officer Violence: Not At Risk (10/29/2021)   Humiliation, Afraid, Rape, and Kick questionnaire    Fear of Current or Ex-Partner: No    Emotionally Abused: No    Physically Abused: No    Sexually Abused: No     Review of Systems    General:  No chills, fever, night sweats or weight changes.  Endorses fatigue Cardiovascular:  No chest pain, dyspnea on exertion, edema, orthopnea, palpitations, paroxysmal nocturnal dyspnea.  Endorses stable angina and occasional shortness of breath Dermatological: No rash, lesions/masses Respiratory: No cough, dyspnea Urologic: No hematuria, dysuria Abdominal:   No nausea, vomiting, diarrhea, bright red blood per rectum, melena, or hematemesis Neurologic:  No visual changes, wkns, changes in  mental status. All other systems reviewed and are otherwise negative except as noted above.   Physical Exam    VS:  BP 134/80 (BP Location: Left Arm, Patient Position: Sitting, Cuff Size: Large)   Pulse 73   Ht '5\' 5"'$  (1.651 m)   Wt 196 lb (88.9 kg)   SpO2 98%   BMI 32.62 kg/m  , BMI Body mass index is 32.62 kg/m.     GEN: Well nourished, well developed, in no acute distress. HEENT: normal.  Glasses on Neck: Supple, no JVD, carotid bruits, or masses. Cardiac: RRR, no murmurs, rubs, or gallops. No clubbing, cyanosis, edema.  Radials/DP/PT 2+ and equal bilaterally.  Respiratory:  Respirations regular and unlabored, clear to auscultation bilaterally. GI: Soft, nontender, nondistended, BS + x 4. MS: no deformity or atrophy. Skin: warm and dry, no rash. Neuro:  Strength and sensation are intact. Psych: Normal affect.  Accessory Clinical Findings    ECG  personally reviewed by me today-sinus rhythm with PACs rate of 73 and a left axis deviation- No acute changes  Lab Results  Component Value Date   WBC 5.6 07/10/2021   HGB 13.9 07/10/2021   HCT 41.3 07/10/2021   MCV 96.2 07/10/2021   PLT 274.0 07/10/2021   Lab Results  Component Value Date   CREATININE 0.82 03/17/2022   BUN 12 03/17/2022   NA 136 03/17/2022   K 4.7 03/17/2022   CL 102 03/17/2022   CO2 27 03/17/2022   Lab Results  Component Value Date   ALT 16 12/03/2021   AST 18 12/03/2021   ALKPHOS 68 12/03/2021   BILITOT 0.5 12/03/2021   Lab Results  Component Value Date   CHOL 182 03/17/2022   HDL 63.30 03/17/2022   LDLCALC 105 (H) 03/17/2022   LDLDIRECT 189.4 10/13/2011   TRIG 70.0 03/17/2022   CHOLHDL 3 03/17/2022    Lab Results  Component Value Date   HGBA1C 5.5 03/17/2022    Assessment & Plan   1.  Coronary artery disease status post CABG and AVR with continued angina.  Lexiscan Myoview revealed no significant ischemia, no EKG changes concerning for ischemia at peak rest or recovery was considered low risk scan.  Echocardiogram completed revealed LVEF 45-50%, G2 DD, mild to moderate mitral regurgitation, aortic valve is been repaired/replaced with bioprosthetic valve, with continued complaints of angina usually related to stress and a started on Imdur 15 mg daily.  She has been continued on aspirin 81 mg daily, atorvastatin 20 mg four times per week, Zetia 10 mg daily, carvedilol 6.25 mg twice daily, lisinopril 2.5 mg every other day, Nitrostat as needed.  2.  Chronic diastolic congestive heart failure with recent echocardiogram that revealed LVEF 45-50%, G2 DD, mild to moderate mitral regurgitation, and aortic valve has been repaired/replaced with bioprosthetic valve.  She is continued on furosemide 20 mg every other day and PRN, carvedilol 6.25 mg twice daily, lisinopril 2.5 mg every other day.   3.  Hypertension with blood pressure today 134/89 which is stable  today.  She has been continued on her current medication regimen without any changes or refills needed today.  4.  Pure hypercholesterolemia with her last lipid panel completed 03/17/2022 with total cholesterol 182, triglycerides of 70, HDL 63.3, LDL 25.  She has been continued on atorvastatin 20 mg 4 times a week and started on Zetia 10 mg daily.  She had previously been sent an order in for repeat lipid and LFT after restarting medication but unfortunately states  that she will get this done within the next few weeks as she has not had time to get them done prior to her appointment today.  5.  History of dissection of the thoracic abdominal aorta which continues to be followed by vascular in [place].  Last CT angio of the chest and pelvis was 07/2022 with stable findings.  6.  Disposition patient return to clinic to see MD/APP in 3 months or sooner if needed.  Lamontae Ricardo, NP 10/25/2022, 11:22 AM

## 2022-10-25 ENCOUNTER — Encounter: Payer: Self-pay | Admitting: Cardiology

## 2022-10-25 ENCOUNTER — Ambulatory Visit: Payer: Medicare HMO | Attending: Cardiology | Admitting: Cardiology

## 2022-10-25 VITALS — BP 134/80 | HR 73 | Ht 65.0 in | Wt 196.0 lb

## 2022-10-25 DIAGNOSIS — Z8679 Personal history of other diseases of the circulatory system: Secondary | ICD-10-CM

## 2022-10-25 DIAGNOSIS — E7800 Pure hypercholesterolemia, unspecified: Secondary | ICD-10-CM

## 2022-10-25 DIAGNOSIS — I10 Essential (primary) hypertension: Secondary | ICD-10-CM | POA: Diagnosis not present

## 2022-10-25 DIAGNOSIS — I480 Paroxysmal atrial fibrillation: Secondary | ICD-10-CM | POA: Diagnosis not present

## 2022-10-25 DIAGNOSIS — Z952 Presence of prosthetic heart valve: Secondary | ICD-10-CM

## 2022-10-25 DIAGNOSIS — I2510 Atherosclerotic heart disease of native coronary artery without angina pectoris: Secondary | ICD-10-CM | POA: Diagnosis not present

## 2022-10-25 MED ORDER — ISOSORBIDE MONONITRATE ER 30 MG PO TB24: 15.0000 mg | ORAL_TABLET | Freq: Every day | ORAL | 3 refills | Status: DC

## 2022-10-25 NOTE — Patient Instructions (Addendum)
Medication Instructions:  START: Imdur 15 mg daily  *If you need a refill on your cardiac medications before your next appointment, please call your pharmacy*   Lab Work: None ordered today   Testing/Procedures: None ordered today   Follow-Up: At [facility], you and your health needs are our priority.  As part of our continuing mission to provide you with exceptional heart care, we have created designated Provider Care Teams.  These Care Teams include your primary Cardiologist (physician) and Advanced Practice Providers (APPs -  Physician Assistants and Nurse Practitioners) who all work together to provide you with the care you need, when you need it.  We recommend signing up for the patient portal called "MyChart".  Sign up information is provided on this After Visit Summary.  MyChart is used to connect with patients for Virtual Visits (Telemedicine).  Patients are able to view lab/test results, encounter notes, upcoming appointments, etc.  Non-urgent messages can be sent to your provider as well.   To learn more about what you can do with MyChart, go to [email or web address].    Your next appointment:   3 month(s)  The format for your next appointment:   In Person  Provider:   Gerrie Nordmann, NP

## 2022-10-26 ENCOUNTER — Ambulatory Visit (INDEPENDENT_AMBULATORY_CARE_PROVIDER_SITE_OTHER): Payer: Medicare HMO | Admitting: Family Medicine

## 2022-10-26 ENCOUNTER — Encounter (INDEPENDENT_AMBULATORY_CARE_PROVIDER_SITE_OTHER): Payer: Self-pay | Admitting: Family Medicine

## 2022-10-26 VITALS — BP 125/70 | HR 71 | Temp 97.9°F | Ht 65.0 in | Wt 191.0 lb

## 2022-10-26 DIAGNOSIS — R7303 Prediabetes: Secondary | ICD-10-CM | POA: Diagnosis not present

## 2022-10-26 DIAGNOSIS — E7849 Other hyperlipidemia: Secondary | ICD-10-CM | POA: Diagnosis not present

## 2022-10-26 DIAGNOSIS — E538 Deficiency of other specified B group vitamins: Secondary | ICD-10-CM | POA: Diagnosis not present

## 2022-10-26 DIAGNOSIS — E669 Obesity, unspecified: Secondary | ICD-10-CM | POA: Diagnosis not present

## 2022-10-26 DIAGNOSIS — E559 Vitamin D deficiency, unspecified: Secondary | ICD-10-CM

## 2022-10-26 DIAGNOSIS — Z6831 Body mass index (BMI) 31.0-31.9, adult: Secondary | ICD-10-CM | POA: Diagnosis not present

## 2022-10-26 DIAGNOSIS — R5383 Other fatigue: Secondary | ICD-10-CM | POA: Diagnosis not present

## 2022-10-27 ENCOUNTER — Telehealth: Payer: Self-pay | Admitting: Family Medicine

## 2022-10-27 ENCOUNTER — Other Ambulatory Visit: Payer: Self-pay | Admitting: Cardiovascular Disease

## 2022-10-27 NOTE — Telephone Encounter (Signed)
LVM for pt to rtn my call to schedule AWV with [facility] call bak # [phone]

## 2022-10-29 LAB — CMP14+EGFR
ALT: 21 IU/L (ref 0–32)
AST: 22 IU/L (ref 0–40)
Albumin/Globulin Ratio: 2 (ref 1.2–2.2)
Albumin: 4.7 g/dL (ref 3.9–4.9)
Alkaline Phosphatase: 76 IU/L (ref 44–121)
BUN/Creatinine Ratio: 18 (ref 12–28)
BUN: 16 mg/dL (ref 8–27)
Bilirubin Total: 0.5 mg/dL (ref 0.0–1.2)
CO2: 25 mmol/L (ref 20–29)
Calcium: 9.2 mg/dL (ref 8.7–10.3)
Chloride: 102 mmol/L (ref 96–106)
Creatinine, Ser: 0.87 mg/dL (ref 0.57–1.00)
Globulin, Total: 2.4 g/dL (ref 1.5–4.5)
Glucose: 95 mg/dL (ref 70–99)
Potassium: 4.5 mmol/L (ref 3.5–5.2)
Sodium: 139 mmol/L (ref 134–144)
Total Protein: 7.1 g/dL (ref 6.0–8.5)
eGFR: 74 mL/min/{1.73_m2} (ref >59)

## 2022-10-29 LAB — LIPID PANEL WITH LDL/HDL RATIO
Cholesterol, Total: 144 mg/dL (ref 100–199)
HDL: 79 mg/dL (ref >39)
LDL Chol Calc (NIH): 53 mg/dL (ref 0–99)
LDL/HDL Ratio: 0.7 ratio (ref 0.0–3.2)
Triglycerides: 59 mg/dL (ref 0–149)
VLDL Cholesterol Cal: 12 mg/dL (ref 5–40)

## 2022-10-29 LAB — CBC WITH DIFFERENTIAL/PLATELET
Basophils Absolute: 0 10*3/uL (ref 0.0–0.2)
Basos: 0 %
EOS (ABSOLUTE): 0.1 10*3/uL (ref 0.0–0.4)
Eos: 3 %
Hematocrit: 42 % (ref 34.0–46.6)
Hemoglobin: 13.6 g/dL (ref 11.1–15.9)
Immature Grans (Abs): 0 10*3/uL (ref 0.0–0.1)
Immature Granulocytes: 0 %
Lymphocytes Absolute: 1.4 10*3/uL (ref 0.7–3.1)
Lymphs: 28 %
MCH: 31.9 pg (ref 26.6–33.0)
MCHC: 32.4 g/dL (ref 31.5–35.7)
MCV: 98 fL — ABNORMAL HIGH (ref 79–97)
Monocytes Absolute: 0.5 10*3/uL (ref 0.1–0.9)
Monocytes: 9 %
Neutrophils Absolute: 3.1 10*3/uL (ref 1.4–7.0)
Neutrophils: 60 %
Platelets: 333 10*3/uL (ref 150–450)
RBC: 4.27 x10E6/uL (ref 3.77–5.28)
RDW: 13.4 % (ref 11.7–15.4)
WBC: 5.1 10*3/uL (ref 3.4–10.8)

## 2022-10-29 LAB — BRAIN NATRIURETIC PEPTIDE: BNP: 16 pg/mL (ref 0.0–100.0)

## 2022-10-29 LAB — VITAMIN B12: Vitamin B-12: 574 pg/mL (ref 232–1245)

## 2022-10-29 LAB — INSULIN, RANDOM: INSULIN: 11.7 u[IU]/mL (ref 2.6–24.9)

## 2022-10-29 LAB — HEMOGLOBIN A1C
Est. average glucose Bld gHb Est-mCnc: 120 mg/dL
Hgb A1c MFr Bld: 5.8 % — ABNORMAL HIGH (ref 4.8–5.6)

## 2022-10-29 LAB — VITAMIN D 25 HYDROXY (VIT D DEFICIENCY, FRACTURES): Vit D, 25-Hydroxy: 57.6 ng/mL (ref 30.0–100.0)

## 2022-10-29 LAB — TSH: TSH: 1.28 u[IU]/mL (ref 0.450–4.500)

## 2022-11-02 ENCOUNTER — Ambulatory Visit (INDEPENDENT_AMBULATORY_CARE_PROVIDER_SITE_OTHER): Payer: Medicare HMO | Admitting: Psychology

## 2022-11-02 DIAGNOSIS — F331 Major depressive disorder, recurrent, moderate: Secondary | ICD-10-CM | POA: Diagnosis not present

## 2022-11-02 NOTE — Progress Notes (Signed)
[place] Counselor/Therapist Progress Note  Patient ID: Rachel Torres, MRN: [id number],    Date: 11/02/2022  Time Spent: 10:00am-10:55am    55 minutes   Treatment Type: Individual Therapy  Reported Symptoms: stress  Mental Status Exam: Appearance:  Casual     Behavior: Appropriate  Motor: Normal  Speech/Language:  Normal Rate  Affect: Appropriate  Mood: normal  Thought process: normal  Thought content:   WNL  Sensory/Perceptual disturbances:   WNL  Orientation: oriented to person, place, time/date, and situation  Attention: Good  Concentration: Good  Memory: WNL  Fund of knowledge:  Good  Insight:   Good  Judgment:  Good  Impulse Control: Good   Risk Assessment: Danger to Self:  No Self-injurious Behavior: No Danger to Others: No Duty to Warn:no Physical Aggression / Violence:No  Access to Firearms a concern: No  Gang Involvement:No   Subjective:  Rachel Torres present for face-to-face individual therapy via video Webex.  Rachel consents to telehealth video session due to COVID 19 pandemic. Location of Rachel: home Location of therapist: home office.   Rachel talked about Rachel Torres getting 100% disability through the [place].   This will qualify him for more caregiving support which will be very helpful to Rachel.  The disability approval will also help Rachel and Rachel Torres financially.  Rachel talked about caregiving for Rachel Torres.  His cognitive issues are challenging to deal with.  Addressed the challenges.  Worked on problem solving and stress management.     Worked on self care strategies.   Provided supportive therapy.  Interventions: Cognitive Behavioral Therapy and Insight-Oriented  Diagnosis: F33.1   Plan of Care: Recommend ongoing therapy.   Rachel participated in setting treatment goals.   Rachel needs a place to talk about stressors.  Plan to meet every two weeks.    Treatment Plan (Treatment Plan Target Date: 03/23/2023) Client Abilities/Strengths  Rachel is bright, engaging, and  motivated for therapy.   Client Treatment Preferences  Individual therapy.  Client Statement of Needs  Improve coping skills.  Have a place to talk about stressors.   Symptoms  Depressed or irritable mood. Unresolved grief issues.  Problems Addressed  Unipolar Depression Goals 1. Alleviate depressive symptoms and return to previous level of effective functioning. 2. Appropriately grieve the loss in order to normalize mood and to return to previously adaptive level of functioning. Objective Learn and implement behavioral strategies to overcome depression. Target Date: 2023-03-23 Frequency: Biweekly  Progress: 10 Modality: individual  Related Interventions Engage the client in "behavioral activation," increasing his/her activity level and contact with sources of reward, while identifying processes that inhibit activation.  Use behavioral techniques such as instruction, rehearsal, role-playing, role reversal, as needed, to facilitate activity in the client's daily life; reinforce success. Assist the client in developing skills that increase the likelihood of deriving pleasure from behavioral activation (e.g., assertiveness skills, developing an exercise plan, less internal/more external focus, increased social involvement); reinforce success. Objective Identify important people in life, past and present, and describe the quality, good and poor, of those relationships. Target Date: 2023-03-23 Frequency: Biweekly  Progress: 10 Modality: individual  Related Interventions Conduct Interpersonal Therapy beginning with the assessment of the client's "interpersonal inventory" of important past and present relationships; develop a case formulation linking depression to grief, interpersonal role disputes, role transitions, and/or interpersonal deficits). Objective Learn and implement problem-solving and decision-making skills. Target Date: 2023-03-23 Frequency: Biweekly  Progress: 10 Modality:  individual  Related Interventions Conduct Problem-Solving Therapy using techniques such  as psychoeducation, modeling, and role-playing to teach client problem-solving skills (i.e., defining a problem specifically, generating possible solutions, evaluating the pros and cons of each solution, selecting and implementing a plan of action, evaluating the efficacy of the plan, accepting or revising the plan); role-play application of the problem-solving skill to a real life issue. Encourage in the client the development of a positive problem orientation in which problems and solving them are viewed as a natural part of life and not something to be feared, despaired, or avoided. 3. Develop healthy interpersonal relationships that lead to the alleviation and help prevent the relapse of depression. 4. Develop healthy thinking patterns and beliefs about self, others, and the world that lead to the alleviation and help prevent the relapse of depression. 5. Recognize, accept, and cope with feelings of depression. Diagnosis F33.1  Conditions For Discharge Achievement of treatment goals and objectives   [name], LCSW

## 2022-11-03 ENCOUNTER — Ambulatory Visit: Payer: Medicare HMO

## 2022-11-03 NOTE — Progress Notes (Signed)
Chief Complaint:   OBESITY Rachel Torres is here to discuss her progress with her obesity treatment plan along with follow-up of her obesity related diagnoses. Rachel Torres is on following a lower carbohydrate, vegetable and lean protein rich diet plan and states she is following her eating plan approximately 50% of the time. Rachel Torres states she is doing 0 minutes 0 times per week.  Today's visit was #: 55 Starting weight: 213 lbs Starting date: 05/05/2020 Today's weight: 191 lbs Today's date: 10/26/2022 Total lbs lost to date: 22 Total lbs lost since last in-office visit: 2  Interim History: Rachel Torres has done well with her weight loss. She notes some celebration eating with her birthday. Her husband has gotten 100% disability through the [place] now.   Subjective:   1. Fatigue, unspecified type Rachel Torres reports some shortness of breath at time and fatigued.   2. Other hyperlipidemia Rachel Torres is taking Lipitor and Zetia with no side effects.   3. Vitamin D deficiency Rachel Torres is taking OTC Vitamin D 2,000 IU 4 times per week.   4. B12 deficiency Rachel Torres is not on B12 supplementation, and she notes fatigue.   5. Prediabetes Rachel Torres's last A1c was 5.5 in March 2023. She is not on medications.   Assessment/Plan:   1. Fatigue, unspecified type We will check labs today. Naleah will continue with her diet and exercise. We will discussed labs results at her next visit.   - TSH - Brain natriuretic peptide  2. Other hyperlipidemia We will check labs today. Rachel Torres will continue Lipitor and Zetia, and she will continue her diet and exercise.   - Lipid Panel With LDL/HDL Ratio  3. Vitamin D deficiency We will check labs today. Rachel Torres will continue OTC Vitamin D 2,000 IU 4 times per week.   - VITAMIN D 25 Hydroxy (Vit-D Deficiency, Fractures)  4. B12 deficiency We will check labs today, and we will discuss labs results at Rachel Torres's next visit.   - Vitamin B12 - CBC with  Differential/Platelet  5. Prediabetes We will check labs today. Rachel Torres will continue with her diet and exercise.   - CMP14+EGFR - Insulin, random - Hemoglobin A1c  6. Obesity, Current BMI 31.9 Rachel Torres is currently in the action stage of change. As such, her goal is to continue with weight loss efforts. She has agreed to practicing portion control and making smarter food choices, such as increasing vegetables and decreasing simple carbohydrates and following a lower carbohydrate, vegetable and lean protein rich diet plan.   Exercise goals: All adults should avoid inactivity. Some physical activity is better than none, and adults who participate in any amount of physical activity gain some health benefits.  Behavioral modification strategies: increasing lean protein intake, decreasing simple carbohydrates, no skipping meals, meal planning and cooking strategies, and planning for success.  Rachel Torres has agreed to follow-up with our clinic in 4 weeks. She was informed of the importance of frequent follow-up visits to maximize her success with intensive lifestyle modifications for her multiple health conditions.   Rachel Torres was informed we would discuss her lab results at her next visit unless there is a critical issue that needs to be addressed sooner. Rachel Torres agreed to keep her next visit at the agreed upon time to discuss these results.  Objective:   Blood pressure 125/70, pulse 71, temperature 97.9 F (36.6 C), height _0  (1.651 m), weight 191 lb (86.6 kg), SpO2 99 %. Body mass index is 31.78 kg/m.  General: Cooperative, alert, well developed, in no  acute distress. HEENT: Conjunctivae and lids unremarkable. Cardiovascular: Regular rhythm.  Lungs: Normal work of breathing. Neurologic: No focal deficits.   Lab Results  Component Value Date   CREATININE 0.87 10/26/2022   BUN 16 10/26/2022   NA 139 10/26/2022   K 4.5 10/26/2022   CL 102 10/26/2022   CO2 25 10/26/2022   Lab Results   Component Value Date   ALT 21 10/26/2022   AST 22 10/26/2022   ALKPHOS 76 10/26/2022   BILITOT 0.5 10/26/2022   Lab Results  Component Value Date   HGBA1C 5.8 (H) 10/26/2022   HGBA1C 5.5 03/17/2022   HGBA1C 5.5 12/03/2021   HGBA1C 5.6 07/10/2021   HGBA1C 5.5 02/26/2021   Lab Results  Component Value Date   INSULIN 11.7 10/26/2022   INSULIN 4.5 12/03/2021   INSULIN 17.3 02/26/2021   INSULIN 13.9 10/29/2020   INSULIN 22.5 07/23/2020   Lab Results  Component Value Date   TSH 1.280 10/26/2022   Lab Results  Component Value Date   CHOL 144 10/26/2022   HDL 79 10/26/2022   LDLCALC 53 10/26/2022   LDLDIRECT 189.4 10/13/2011   TRIG 59 10/26/2022   CHOLHDL 3 03/17/2022   Lab Results  Component Value Date   VD25OH 57.6 10/26/2022   VD25OH 57.7 12/03/2021   VD25OH 74.87 07/10/2021   Lab Results  Component Value Date   WBC 5.1 10/26/2022   HGB 13.6 10/26/2022   HCT 42.0 10/26/2022   MCV 98 (H) 10/26/2022   PLT 333 10/26/2022   Lab Results  Component Value Date   FERRITIN 105.0 12/11/2010   Attestation Statements:   Reviewed by clinician on day of visit: allergies, medications, problem list, medical history, surgical history, family history, social history, and previous encounter notes.   I, Trixie Dredge, am acting as transcriptionist for [name], MD.  I have reviewed the above documentation for accuracy and completeness, and I agree with the above. -  [name], MD

## 2022-11-08 ENCOUNTER — Telehealth (INDEPENDENT_AMBULATORY_CARE_PROVIDER_SITE_OTHER): Payer: Medicare HMO | Admitting: Psychology

## 2022-11-08 DIAGNOSIS — F5089 Other specified eating disorder: Secondary | ICD-10-CM

## 2022-11-08 DIAGNOSIS — F4320 Adjustment disorder, unspecified: Secondary | ICD-10-CM | POA: Diagnosis not present

## 2022-11-08 NOTE — Progress Notes (Signed)
  Office: [phone]  /  Fax: [phone]    Date: November 08, 2022    Appointment Start Time: 8:04am Duration: 27 minutes Provider: Glennie Isle, Psy.D. Type of Session: Individual Therapy  Location of Patient: Home (private location) Location of Provider: Provider's Home (private office) Type of Contact: Telepsychological Visit via MyChart Video Visit  Session Content:This provider called Tayden at 8:03am as she did not present for today's appointment. Assistance was provided. As such, today's appointment was initiated 4 minutes late. Desirae is a 66 y.o. female presenting for a follow-up appointment to address the previously established treatment goal of increasing coping skills.Today's appointment was a telepsychological visit. Lakeitha provided verbal consent for today's telepsychological appointment and she is aware she is responsible for securing confidentiality on her end of the session. Prior to proceeding with today's appointment, Miley's physical location at the time of this appointment was obtained as well a phone number she could be reached at in the event of technical difficulties. Adelise and this provider participated in today's telepsychological service.   This provider conducted a brief check-in. Claudette reported, "I spent five days with a migraine." She also shared her husband received [employer] benefits. Regarding eating habits, Mariajose stated, "I still have days where no matter how often I eat, I get hungry." Further explored and processed. It was reflected she likely is not consuming enough protein. Notably, she lost two more pounds and she will be receiving significantly more hours of caregiver support as well as financial support which allow for increased engagement in self-care (e.g., going to the gym, crafting). Remainder of today's appointment focused further on self-compassion. This provider and Coleta explored the story her mind tells her about her weight loss/eating  habits that is stuck on repeat. Remainder of the appointment focused on starting to re-write a more compassionate version, and she was encouraged to complete it before now and the next appointment. She agreed. Overall, Tarri was receptive to today's appointment as evidenced by openness to sharing, responsiveness to feedback, and willingness to work toward increasing self-compassion.  Mental Status Examination:  Appearance: neat Behavior: appropriate to circumstances Mood: sad Affect: mood congruent Speech: WNL Eye Contact: appropriate Psychomotor Activity: WNL Gait: unable to assess Thought Process: linear, logical, and goal directed and no evidence or endorsement of suicidal, homicidal, and self-harm ideation, plan and intent  Thought Content/Perception: no hallucinations, delusions, bizarre thinking or behavior endorsed or observed Orientation: AAOx4 Memory/Concentration: memory, attention, language, and fund of knowledge intact  Insight: fair Judgment: fair  Interventions:  Conducted a brief chart review Provided empathic reflections and validation Provided positive reinforcement Employed supportive psychotherapy interventions to facilitate reduced distress and to improve coping skills with identified stressors Engaged pt in a self-compassion exercise  DSM-5 Diagnosis(es):  F50.89 Other Specified Feeding or Eating Disorder, Emotional Eating Behaviors and F43.20 Adjustment Disorder, Unspecified   Treatment Goal & Progress: During the initial appointment with this provider, the following treatment goal was established: increase coping skills. Rielly has demonstrated progress in her goal as evidenced by willingness to remind herself of all or nothing thinking, utilizing the hunger and satisfaction scale, and engaging in self-care.    Plan: The next appointment is scheduled for 11/22/2022 at 8am, which will be via [place] Visit. The next session will focus on working towards the  established treatment goal. She will continue meeting with her primary therapist.

## 2022-11-12 ENCOUNTER — Telehealth: Payer: Self-pay | Admitting: Student

## 2022-11-12 NOTE — Telephone Encounter (Signed)
Palliative NP left message to discuss changes/restructuring of Palliative Medicine program in the community. Awaiting return call.

## 2022-11-12 NOTE — Telephone Encounter (Signed)
Palliative NP received return call from patient. She is made aware of changes/restructuring of Palliative Medicine in the community. Will sign off as she does not meet criteria at this time; she expresses understanding.

## 2022-11-16 ENCOUNTER — Ambulatory Visit (INDEPENDENT_AMBULATORY_CARE_PROVIDER_SITE_OTHER): Payer: Medicare HMO | Admitting: Psychology

## 2022-11-16 DIAGNOSIS — F331 Major depressive disorder, recurrent, moderate: Secondary | ICD-10-CM

## 2022-11-16 NOTE — Progress Notes (Signed)
[facility] Counselor/Therapist Progress Note  Patient ID: Rachel Torres, MRN: [id number],    Date: 11/16/2022  Time Spent: 10:00am-10:55am    55 minutes   Treatment Type: Individual Therapy  Reported Symptoms: stress  Mental Status Exam: Appearance:  Casual     Behavior: Appropriate  Motor: Normal  Speech/Language:  Normal Rate  Affect: Appropriate  Mood: normal  Thought process: normal  Thought content:   WNL  Sensory/Perceptual disturbances:   WNL  Orientation: oriented to person, place, time/date, and situation  Attention: Good  Concentration: Good  Memory: WNL  Fund of knowledge:  Good  Insight:   Good  Judgment:  Good  Impulse Control: Good   Risk Assessment: Danger to Self:  No Self-injurious Behavior: No Danger to Others: No Duty to Warn:no Physical Aggression / Violence:No  Access to Firearms a concern: No  Gang Involvement:No   Subjective:  Rachel Torres present for face-to-face individual therapy via video Webex.  Rachel consents to telehealth video session due to COVID 19 pandemic. Location of Rachel: home Location of therapist: home office.   Rachel talked about filling out a lot of paperwork for the [place] that will help her get more caregiving hours.   This will help Rachel's stress to have more respite from caregiving.  Rachel is going to a caregiver support group at the [place].  Rachel talked about the holidays.   She had a good Thanksgiving at her sister's house.   Rachel hosts Christmas and will have her and [name] family.   Rachel talked about caregiving for [name].  His cognitive issues are challenging to deal with.  Addressed the challenges.  Worked on problem solving and stress management.  Rachel states she is grieving the loss of the man [name] use to be.  Rachel talked about her health.  She has been having migraines.     Worked on self care strategies.   Provided supportive therapy.  Interventions: Cognitive Behavioral Therapy and Insight-Oriented  Diagnosis: F33.1   Plan  of Care: Recommend ongoing therapy.   Rachel participated in setting treatment goals.   Rachel needs a place to talk about stressors.  Plan to meet every two weeks.    Treatment Plan (Treatment Plan Target Date: 03/23/2023) Client Abilities/Strengths  Rachel is bright, engaging, and motivated for therapy.   Client Treatment Preferences  Individual therapy.  Client Statement of Needs  Improve coping skills.  Have a place to talk about stressors.   Symptoms  Depressed or irritable mood. Unresolved grief issues.  Problems Addressed  Unipolar Depression Goals 1. Alleviate depressive symptoms and return to previous level of effective functioning. 2. Appropriately grieve the loss in order to normalize mood and to return to previously adaptive level of functioning. Objective Learn and implement behavioral strategies to overcome depression. Target Date: 2023-03-23 Frequency: Biweekly  Progress: 10 Modality: individual  Related Interventions Engage the client in "behavioral activation," increasing his/her activity level and contact with sources of reward, while identifying processes that inhibit activation.  Use behavioral techniques such as instruction, rehearsal, role-playing, role reversal, as needed, to facilitate activity in the client's daily life; reinforce success. Assist the client in developing skills that increase the likelihood of deriving pleasure from behavioral activation (e.g., assertiveness skills, developing an exercise plan, less internal/more external focus, increased social involvement); reinforce success. Objective Identify important people in life, past and present, and describe the quality, good and poor, of those relationships. Target Date: 2023-03-23 Frequency: Biweekly  Progress: 10 Modality: individual  Related Interventions Conduct Interpersonal Therapy beginning with the assessment of the client's "interpersonal inventory" of important past and present relationships; develop a  case formulation linking depression to grief, interpersonal role disputes, role transitions, and/or interpersonal deficits). Objective Learn and implement problem-solving and decision-making skills. Target Date: 2023-03-23 Frequency: Biweekly  Progress: 10 Modality: individual  Related Interventions Conduct Problem-Solving Therapy using techniques such as psychoeducation, modeling, and role-playing to teach client problem-solving skills (i.e., defining a problem specifically, generating possible solutions, evaluating the pros and cons of each solution, selecting and implementing a plan of action, evaluating the efficacy of the plan, accepting or revising the plan); role-play application of the problem-solving skill to a real life issue. Encourage in the client the development of a positive problem orientation in which problems and solving them are viewed as a natural part of life and not something to be feared, despaired, or avoided. 3. Develop healthy interpersonal relationships that lead to the alleviation and help prevent the relapse of depression. 4. Develop healthy thinking patterns and beliefs about self, others, and the world that lead to the alleviation and help prevent the relapse of depression. 5. Recognize, accept, and cope with feelings of depression. Diagnosis F33.1  Conditions For Discharge Achievement of treatment goals and objectives   [name], LCSW

## 2022-11-22 ENCOUNTER — Telehealth (INDEPENDENT_AMBULATORY_CARE_PROVIDER_SITE_OTHER): Payer: Medicare HMO | Admitting: Psychology

## 2022-11-22 DIAGNOSIS — F4320 Adjustment disorder, unspecified: Secondary | ICD-10-CM | POA: Diagnosis not present

## 2022-11-22 DIAGNOSIS — F5089 Other specified eating disorder: Secondary | ICD-10-CM | POA: Diagnosis not present

## 2022-11-22 NOTE — Progress Notes (Signed)
  Office: [phone]  /  Fax: [phone]    Date: November 22, 2022    Appointment Start Time: 8:01am Duration: 29 minutes Provider: Glennie Isle, Psy.D. Type of Session: Individual Therapy  Location of Patient: Home (private location) Location of Provider: Provider's Home (private office) Type of Contact: Telepsychological Visit via MyChart Video Visit  Session Content: Rachel Torres is a 66 y.o. female presenting for a follow-up appointment to address the previously established treatment goal of increasing coping skills.Today's appointment was a telepsychological visit. [name] provided verbal consent for today's telepsychological appointment and she is aware she is responsible for securing confidentiality on her end of the session. Prior to proceeding with today's appointment, [name]'s physical location at the time of this appointment was obtained as well a phone number she could be reached at in the event of technical difficulties. Marzell and this provider participated in today's telepsychological service.   This provider conducted a brief check-in. [name] shared about recent events, including an improvement in financial stability. Regarding eating habits, she acknowledged deviations due to celebrations/holiday. This provider and [name] explored what went well during the aforementioned and what can be done differently in the future. Discussed the importance of protein intake and engaged [name] in problem solving to increase protein intake. Notably, [name] discussed utilizing her phone to organize ongoing tasks, appointments, etc. As such, she was encouraged to use her phone to set reminders to eat regularly. She agreed. Overall, [name] was receptive to today's appointment as evidenced by openness to sharing, responsiveness to feedback, and willingness to implement discussed strategies .  Mental Status Examination:  Appearance: neat Behavior: appropriate to circumstances Mood:  sad Affect: mood congruent Speech: WNL Eye Contact: appropriate Psychomotor Activity: WNL Gait: unable to assess Thought Process: linear, logical, and goal directed and no evidence or endorsement of suicidal, homicidal, and self-harm ideation, plan and intent  Thought Content/Perception: no hallucinations, delusions, bizarre thinking or behavior endorsed or observed Orientation: AAOx4 Memory/Concentration: memory, attention, language, and fund of knowledge intact  Insight: fair Judgment: fair  Interventions:  Conducted a brief chart review Provided empathic reflections and validation Reviewed content from the previous session Provided positive reinforcement Employed supportive psychotherapy interventions to facilitate reduced distress and to improve coping skills with identified stressors Engaged patient in problem solving  DSM-5 Diagnosis(es):  F50.89 Other Specified Feeding or Eating Disorder, Emotional Eating Behaviors and F43.20 Adjustment Disorder, Unspecified   Treatment Goal & Progress: During the initial appointment with this provider, the following treatment goal was established: increase coping skills. [name] has demonstrated progress in her goal as evidenced by willingness to remind herself of all or nothing thinking, utilizing the hunger and satisfaction scale, and engaging in self-care.     Plan: The next appointment is scheduled for 12/06/2022 at 8am, which will be via [place] Visit. The next session will focus on working towards the established treatment goal. She will continue meeting with her primary therapist.

## 2022-11-24 ENCOUNTER — Ambulatory Visit (INDEPENDENT_AMBULATORY_CARE_PROVIDER_SITE_OTHER): Payer: Medicare HMO | Admitting: Family Medicine

## 2022-11-24 ENCOUNTER — Encounter (INDEPENDENT_AMBULATORY_CARE_PROVIDER_SITE_OTHER): Payer: Self-pay | Admitting: Family Medicine

## 2022-11-24 VITALS — BP 111/65 | HR 75 | Temp 97.6°F | Ht 65.0 in | Wt 194.0 lb

## 2022-11-24 DIAGNOSIS — Z6832 Body mass index (BMI) 32.0-32.9, adult: Secondary | ICD-10-CM

## 2022-11-24 DIAGNOSIS — R7303 Prediabetes: Secondary | ICD-10-CM

## 2022-11-24 DIAGNOSIS — E669 Obesity, unspecified: Secondary | ICD-10-CM

## 2022-11-24 DIAGNOSIS — Z6835 Body mass index (BMI) 35.0-35.9, adult: Secondary | ICD-10-CM

## 2022-11-30 ENCOUNTER — Telehealth: Payer: Self-pay

## 2022-11-30 ENCOUNTER — Ambulatory Visit (INDEPENDENT_AMBULATORY_CARE_PROVIDER_SITE_OTHER): Payer: Medicare HMO | Admitting: Psychology

## 2022-11-30 DIAGNOSIS — F331 Major depressive disorder, recurrent, moderate: Secondary | ICD-10-CM

## 2022-11-30 NOTE — Progress Notes (Signed)
    Chronic Care Management Pharmacy Assistant   Name: Rachel Torres  MRN: [id number] DOB: 09/05/1956  Reason for Encounter: CCM (Appointment Reminder)  Medications: Outpatient Encounter Medications as of 11/30/2022  Medication Sig   acetaminophen (TYLENOL) 500 MG tablet Take 500 mg by mouth every 6 (six) hours as needed.   albuterol (VENTOLIN HFA) 108 (90 Base) MCG/ACT inhaler Inhale 1-2 puffs into the lungs every 6 (six) hours as needed for wheezing or shortness of breath.   ALPRAZolam (XANAX) 0.5 MG tablet Take 1 tablet (0.5 mg total) by mouth daily as needed for anxiety.   aspirin EC 81 MG tablet Take 81 mg by mouth at bedtime.   atorvastatin (LIPITOR) 20 MG tablet Take 20 mg by mouth. Four days per week   carvedilol (COREG) 6.25 MG tablet TAKE 1 TABLET TWICE DAILY WITH MEALS   Cholecalciferol (VITAMIN D3) 50 MCG (2000 UT) CAPS Take 2,000 capsules by mouth. FOUR TIMES A WEEK   clindamycin (CLEOCIN) 300 MG capsule 300 mg. AS NEEDED FOR DENTAL PROCEDURES   cyclobenzaprine (FLEXERIL) 10 MG tablet Take 0.5-1 tablets (5-10 mg total) by mouth 3 (three) times daily as needed. For headache   ezetimibe (ZETIA) 10 MG tablet Take 1 tablet (10 mg total) by mouth daily.   fluticasone (FLONASE) 50 MCG/ACT nasal spray Place into both nostrils.   furosemide (LASIX) 20 MG tablet TAKE 1 TABLET (20 MG TOTAL) BY MOUTH EVERY OTHER DAY. AND AS NEEDED FOR SWELLING OR SHORTNESS OF BREATH   isosorbide mononitrate (IMDUR) 30 MG 24 hr tablet Take 0.5 tablets (15 mg total) by mouth daily.   lisinopril (ZESTRIL) 5 MG tablet Take 2.5 mg by mouth every other day.   magnesium gluconate (MAGONATE) 500 MG tablet Take 500 mg by mouth at bedtime.   Melatonin 5 MG SUBL Place under the tongue at bedtime as needed.   nitroGLYCERIN (NITROSTAT) 0.4 MG SL tablet Place 1 tablet (0.4 mg total) under the tongue every 5 (five) minutes as needed for chest pain.   omeprazole (PRILOSEC) 40 MG capsule TAKE 1 CAPSULE EVERY DAY    polyethylene glycol powder (GLYCOLAX/MIRALAX) 17 GM/SCOOP powder Take 17 g by mouth as needed.   potassium chloride (KLOR-CON M) 10 MEQ tablet TAKE 1 TABLET (10 MEQ TOTAL) BY MOUTH EVERY OTHER DAY. TAKE WITH LASIX   promethazine (PHENERGAN) 25 MG tablet Take 0.5 tablets (12.5 mg total) by mouth every 8 (eight) hours as needed for nausea.   Simethicone (MYLANTA GAS PO) Take by mouth 3 times/day as needed-between meals & bedtime.   TART CHERRY PO Take 1 capsule by mouth 2 (two) times daily.   venlafaxine XR (EFFEXOR-XR) 150 MG 24 hr capsule TAKE 1 CAPSULE EVERY DAY WITH BREAKFAST   venlafaxine XR (EFFEXOR-XR) 37.5 MG 24 hr capsule TAKE 1 CAPSULE (37.5 MG TOTAL) BY MOUTH DAILY WITH BREAKFAST.   No facility-administered encounter medications on file as of 11/30/2022.   Sonnia C Chagoya was contacted to remind of upcoming telephone visit with Charlene Brooke on 12/03/2022 at 8:45. Patient was reminded to have any blood glucose and blood pressure readings available for review at appointment.   Message was left reminding patient of appointment.  CCM referral has been placed prior to visit?  Yes   Star Rating Drugs: Medication:  Last Fill: Day Supply Atorvastatin 20 mg 08/15/2022 90 Lisinopril 5 mg  08/15/2022 [place], CPP notified  [name], [place] Clinical Pharmacy Assistant [phone]

## 2022-11-30 NOTE — Progress Notes (Signed)
[facility] Counselor/Therapist Progress Note  Patient ID: Rachel Torres, MRN: [id number],    Date: 11/30/2022  Time Spent: 10:00am-10:55am    55 minutes   Treatment Type: Individual Therapy  Reported Symptoms: stress  Mental Status Exam: Appearance:  Casual     Behavior: Appropriate  Motor: Normal  Speech/Language:  Normal Rate  Affect: Appropriate  Mood: normal  Thought process: normal  Thought content:   WNL  Sensory/Perceptual disturbances:   WNL  Orientation: oriented to person, place, time/date, and situation  Attention: Good  Concentration: Good  Memory: WNL  Fund of knowledge:  Good  Insight:   Good  Judgment:  Good  Impulse Control: Good   Risk Assessment: Danger to Self:  No Self-injurious Behavior: No Danger to Others: No Duty to Warn:no Physical Aggression / Violence:No  Access to Firearms a concern: No  Gang Involvement:No   Subjective:  [name] present for face-to-face individual therapy via video Webex.  Pt consents to telehealth video session due to COVID 19 pandemic. Location of pt: home Location of therapist: home office.   Pt talked about the holidays.   She will be hosting Christmas at her house.  She is excited but there is a lot to do to prepare for the company.  Addressed the stress of the preparations.   Pt talked about caregiving for Rachel Torres.  His cognitive issues are challenging to deal with.  Addressed the challenges.  Worked on problem solving and stress management.   Pt hopes to be getting some additional caregiving support from the [place].  Worked on self care strategies.   Provided supportive therapy.  Interventions: Cognitive Behavioral Therapy and Insight-Oriented  Diagnosis: F33.1   Plan of Care: Recommend ongoing therapy.   Pt participated in setting treatment goals.   Pt needs a place to talk about stressors.  Plan to meet every two weeks.    Treatment Plan (Treatment Plan Target Date: 03/23/2023) Client  Abilities/Strengths  Pt is bright, engaging, and motivated for therapy.   Client Treatment Preferences  Individual therapy.  Client Statement of Needs  Improve coping skills.  Have a place to talk about stressors.   Symptoms  Depressed or irritable mood. Unresolved grief issues.  Problems Addressed  Unipolar Depression Goals 1. Alleviate depressive symptoms and return to previous level of effective functioning. 2. Appropriately grieve the loss in order to normalize mood and to return to previously adaptive level of functioning. Objective Learn and implement behavioral strategies to overcome depression. Target Date: 2023-03-23 Frequency: Biweekly  Progress: 10 Modality: individual  Related Interventions Engage the client in "behavioral activation," increasing his/her activity level and contact with sources of reward, while identifying processes that inhibit activation.  Use behavioral techniques such as instruction, rehearsal, role-playing, role reversal, as needed, to facilitate activity in the client's daily life; reinforce success. Assist the client in developing skills that increase the likelihood of deriving pleasure from behavioral activation (e.g., assertiveness skills, developing an exercise plan, less internal/more external focus, increased social involvement); reinforce success. Objective Identify important people in life, past and present, and describe the quality, good and poor, of those relationships. Target Date: 2023-03-23 Frequency: Biweekly  Progress: 10 Modality: individual  Related Interventions Conduct Interpersonal Therapy beginning with the assessment of the client's "interpersonal inventory" of important past and present relationships; develop a case formulation linking depression to grief, interpersonal role disputes, role transitions, and/or interpersonal deficits). Objective Learn and implement problem-solving and decision-making skills. Target Date: 2023-03-23  Frequency: Biweekly  Progress:  10 Modality: individual  Related Interventions Conduct Problem-Solving Therapy using techniques such as psychoeducation, modeling, and role-playing to teach client problem-solving skills (i.e., defining a problem specifically, generating possible solutions, evaluating the pros and cons of each solution, selecting and implementing a plan of action, evaluating the efficacy of the plan, accepting or revising the plan); role-play application of the problem-solving skill to a real life issue. Encourage in the client the development of a positive problem orientation in which problems and solving them are viewed as a natural part of life and not something to be feared, despaired, or avoided. 3. Develop healthy interpersonal relationships that lead to the alleviation and help prevent the relapse of depression. 4. Develop healthy thinking patterns and beliefs about self, others, and the world that lead to the alleviation and help prevent the relapse of depression. 5. Recognize, accept, and cope with feelings of depression. Diagnosis F33.1  Conditions For Discharge Achievement of treatment goals and objectives   [name], LCSW

## 2022-12-03 ENCOUNTER — Ambulatory Visit: Payer: Medicare HMO | Admitting: Pharmacist

## 2022-12-03 DIAGNOSIS — E7849 Other hyperlipidemia: Secondary | ICD-10-CM

## 2022-12-03 DIAGNOSIS — I10 Essential (primary) hypertension: Secondary | ICD-10-CM

## 2022-12-03 DIAGNOSIS — G43009 Migraine without aura, not intractable, without status migrainosus: Secondary | ICD-10-CM

## 2022-12-03 DIAGNOSIS — M810 Age-related osteoporosis without current pathological fracture: Secondary | ICD-10-CM

## 2022-12-03 DIAGNOSIS — I5042 Chronic combined systolic (congestive) and diastolic (congestive) heart failure: Secondary | ICD-10-CM

## 2022-12-03 DIAGNOSIS — F418 Other specified anxiety disorders: Secondary | ICD-10-CM

## 2022-12-03 DIAGNOSIS — I2510 Atherosclerotic heart disease of native coronary artery without angina pectoris: Secondary | ICD-10-CM

## 2022-12-03 NOTE — Progress Notes (Unsigned)
Chronic Care Management Pharmacy Note  12/09/2022 Name:  Rachel Torres MRN:  [id number] DOB:  Feb 16, 1956  Summary: CCM F/U visit -Wt gain: Pt has gained ~20 lbs since May in s/o increased stress related to caregiving and comfort eating; husband was just approved for 100% [employer] benefits so stress has improved some -Pt reports increased headaches since starting isosorbide, and small improvement in angina; discussed headaches are a side effect of nitrates  Recommendations/Changes made from today's visit: -Advised pt to contact cardiology if headaches are not tolerable; consider Ranexa instead of isosorbide if further anginal treatment needed  Plan: -[place] will call patient 3 months for general update -Pharmacist follow up televisit scheduled for 6 months -PCP annual visit due July/Aug 2023    Subjective: Rachel Torres is an 66 y.o. year old female who is a primary patient of Tower, Wynelle Fanny, MD.  The CCM team was consulted for assistance with disease management and care coordination needs.    Engaged with patient by telephone for follow up visit in response to provider referral for pharmacy case management and/or care coordination services.   Consent to Services:  The patient was given information about Chronic Care Management services, agreed to services, and gave verbal consent prior to initiation of services.  Please see initial visit note for detailed documentation.   Patient Care Team: Tower, Wynelle Fanny, MD as PCP - General (Family Medicine) [name], Kathlene November, MD as PCP - Cardiology (Cardiology) Starlyn Skeans, MD as Consulting Physician (Family Medicine) Charlton Haws, [facility] as Pharmacist (Pharmacist)  Recent office visits: 09/27/22 Dr Lorelei Pont OV: shoulder pain - steroid injection given  09/15/22 Dr Glori Bickers OV: chest pain - BP 154/78 in s/o stress. Advised to take NTG for chest pain and call cardiology for f/u appt.  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 Refilled flexeril and phenergan for migraines.   12/08/21 TE - increase venlafaxine by 37.5 mg  Recent consult visits: 10/25/22 NP Gerrie Nordmann (Cardiology): f/u -ECHO 45-50%. Lexiscan no significant ischemia. Started Imdur 15 mg daily.   09/21/22 NP Gerrie Nordmann (Cardiology): chest pain - schedule lexiscan, ECHO, BMP. Has not started Zetia yet. Repeat lipids 6-8 wks.  07/07/22 NP Berger (Palliative): discussed depression/anxiety. Switch to hydroxyzine 12.5 mg TID from alprazolam. Reach out to PCP re: increasing venlafaxine dose.  06/07/22 Dr [name] (Cardiology): f/u Afib, CAD. Rx ezetimibe and NTG  05/12/22 Dr Leafy Ro (Weight mgmt): starting wt 213 lbs, current wt 170 lbs (started 04/2020). C/o constipation, advised to increase Miralax.  05/11/22 Terri Bauert LCSW (Behavioral health): f/u MDD.  03/16/22 Dr Leafy Ro (Weight mgmt): d/c ozempic  11/02/21 Dr [name] (Cardiology): f/u PAF. Refilled meds.  Hospital visits: None in previous 6 months   Objective:  Lab Results  Component Value Date   CREATININE 0.87 10/26/2022   BUN 16 10/26/2022   GFR 75.60 03/17/2022   EGFR 74 10/26/2022   GFRNONAA 68 07/23/2020   GFRAA 78 07/23/2020   NA 139 10/26/2022   K 4.5 10/26/2022   CALCIUM 9.2 10/26/2022   CO2 25 10/26/2022   GLUCOSE 95 10/26/2022    Lab Results  Component Value Date/Time   HGBA1C 5.8 (H) 10/26/2022 09:02 AM   HGBA1C 5.5 03/17/2022 10:17 AM   GFR 75.60 03/17/2022 10:17 AM   GFR  68.85 07/10/2021 08:10 AM    Last diabetic Eye exam: No results found for: "HMDIABEYEEXA"  Last diabetic Foot exam: No results found for: "HMDIABFOOTEX"   Lab Results  Component Value Date   CHOL 144 10/26/2022   HDL 79 10/26/2022   LDLCALC 53 10/26/2022   LDLDIRECT 189.4  10/13/2011   TRIG 59 10/26/2022   CHOLHDL 3 03/17/2022       Latest Ref Rng & Units 10/26/2022    9:02 AM 12/03/2021    7:55 AM 07/10/2021    8:10 AM  Hepatic Function  Total Protein 6.0 - 8.5 g/dL 7.1  7.0  7.1   Albumin 3.9 - 4.9 g/dL 4.7  4.6  4.4   AST 0 - 40 IU/L _0 ALT 0 - 32 IU/L _1 Alk Phosphatase 44 - 121 IU/L 76  68  62   Total Bilirubin 0.0 - 1.2 mg/dL 0.5  0.5  0.6     Lab Results  Component Value Date/Time   TSH 1.280 10/26/2022 09:02 AM   TSH 1.54 07/10/2021 08:10 AM   FREET4 0.94 05/05/2020 11:53 AM   FREET4 0.81 07/12/2016 02:36 PM       Latest Ref Rng & Units 10/26/2022    9:02 AM 07/10/2021    8:10 AM 10/21/2020    8:56 AM  CBC  WBC 3.4 - 10.8 x10E3/uL 5.1  5.6  6.0   Hemoglobin 11.1 - 15.9 g/dL 13.6  13.9  13.6   Hematocrit 34.0 - 46.6 % 42.0  41.3  40.3   Platelets 150 - 450 x10E3/uL 333  274.0  288.0     Lab Results  Component Value Date/Time   VD25OH 57.6 10/26/2022 09:02 AM   VD25OH 57.7 12/03/2021 07:55 AM   VD25OH 74.87 07/10/2021 08:10 AM   VD25OH 69.53 06/07/2019 10:38 AM    Clinical ASCVD: Yes  The 10-year ASCVD risk score (Arnett DK, et al., 2019) is: 8.4%   Values used to calculate the score:     Age: 26 years     Sex: Female     Is Non-Hispanic African American: No     Diabetic: Yes     Tobacco smoker: No     Systolic Blood Pressure: 078 mmHg     Is BP treated: Yes     HDL Cholesterol: 79 mg/dL     Total Cholesterol: 144 mg/dL       07/13/2022   11:19 AM 10/29/2021    9:03 AM 07/17/2021    2:21 PM  Depression screen PHQ 2/9  Decreased Interest 3 0 0  Down, Depressed, Hopeless 3 0 0  PHQ - 2 Score 6 0 0  Altered sleeping 2  2  Tired, decreased energy 2  2  Change in appetite 3  0  Feeling bad or failure about yourself  2  0  Trouble concentrating 2  0  Moving slowly or fidgety/restless 2  0  Suicidal thoughts 0  0  PHQ-9 Score 19  4  Difficult doing work/chores Somewhat difficult  Somewhat  difficult        07/13/2022   11:21 AM 06/28/2017   12:57 PM  GAD 7 : Generalized Anxiety Score  Nervous, Anxious, on Edge 2 2  Control/stop worrying 2 1  Worry too much - different things 2 1  Trouble relaxing 2 2  Restless 1 1  Easily annoyed or irritable 2 2  Afraid -  awful might happen 2 1  Total GAD 7 Score 13 10  Anxiety Difficulty Somewhat difficult      Social History   Tobacco Use  Smoking Status Former   Packs/day: 0.75   Years: 1.50   Total pack years: 1.13   Types: Cigarettes   Quit date: 12/20/1978   Years since quitting: 44.0  Smokeless Tobacco Never   BP Readings from Last 3 Encounters:  12/09/22 116/80  11/24/22 111/65  10/26/22 125/70   Pulse Readings from Last 3 Encounters:  12/09/22 77  11/24/22 75  10/26/22 71   Wt Readings from Last 3 Encounters:  12/09/22 204 lb (92.5 kg)  11/24/22 194 lb (88 kg)  10/26/22 191 lb (86.6 kg)   BMI Readings from Last 3 Encounters:  12/09/22 33.95 kg/m  11/24/22 32.28 kg/m  10/26/22 31.78 kg/m    Assessment/Interventions: Review of patient past medical history, allergies, medications, health status, including review of consultants reports, laboratory and other test data, was performed as part of comprehensive evaluation and provision of chronic care management services.   SDOH:  (Social Determinants of Health) assessments and interventions performed: No  SDOH Interventions    Flowsheet Row Office Visit from 09/15/2022 in [place] at [place] Management from 06/08/2021 in [place] at [facility] Video Visit from 05/28/2020 in [place] at [place] from 06/05/2019 in [place] at [place]  SDOH Interventions      Depression Interventions/Treatment  Medication -- Currently on Treatment Counseling  Financial Strain Interventions -- Intervention Not Indicated -- --      [place]: No Food Insecurity  (10/29/2021)  Housing: Low Risk  (10/29/2021)  Transportation Needs: No Transportation Needs (10/29/2021)  Alcohol Screen: Low Risk  (10/29/2021)  Depression (PHQ2-9): High Risk (07/13/2022)  Financial Resource Strain: Low Risk  (10/29/2021)  Physical Activity: Inactive (10/29/2021)  Social Connections: Socially Integrated (10/29/2021)  Stress: No Stress Concern Present (10/29/2021)  Tobacco Use: Medium Risk (12/09/2022)    CCM Care Plan  Allergies  Allergen Reactions   Fosamax [Alendronate Sodium]     GI upset    Ginzing [Ginseng] Other (See Comments)    Hyper and jitery    Hydrocod Poli-Chlorphe Poli Er Itching   Hydrocodone-Acetaminophen Itching   Imitrex [Sumatriptan]     Contraindicated w/ heart condition   Oxycodone Itching   Peanut-Containing Drug Products Other (See Comments)    Reaction:  Migraines    Tramadol Other (See Comments)    itching   Hydrocodone-Guaifenesin Itching   Prednisone Rash   Tape Itching    Medications Reviewed Today     Reviewed by Nelva Bush, MD (Physician) on 12/09/22 at [phone]  Med List Status: <None>   Medication Order Taking? Sig Documenting Provider Last Dose Status Informant  acetaminophen (TYLENOL) 500 MG tablet [id number] Yes Take 500 mg by mouth every 6 (six) hours as needed. [provider] Taking Active   albuterol (VENTOLIN HFA) 108 (90 Base) MCG/ACT inhaler [id number] Yes Inhale 1-2 puffs into the lungs every 6 (six) hours as needed for wheezing or shortness of breath. Tower, Wynelle Fanny, MD Taking Active   ALPRAZolam Duanne Moron) 0.5 MG tablet [id number] Yes Take 1 tablet (0.5 mg total) by mouth daily as needed for anxiety. Tower, Wynelle Fanny, MD Taking Active   aspirin EC 81 MG tablet [id number] Yes Take 81 mg by mouth at bedtime. [provider] Taking Active Self  atorvastatin (LIPITOR) 20 MG tablet  [id number] Yes Take 20 mg by mouth. Four days per week [provider] Taking Active   carvedilol (COREG) 6.25 MG  tablet [id number] Yes TAKE 1 TABLET TWICE DAILY WITH MEALS Gollan, Kathlene November, MD Taking Active   Cholecalciferol (VITAMIN D3) 50 MCG (2000 UT) CAPS [id number] Yes Take 2,000 capsules by mouth. FOUR TIMES A WEEK [provider] Taking Active   clindamycin (CLEOCIN) 300 MG capsule [id number] Yes 300 mg. AS NEEDED FOR DENTAL PROCEDURES [provider] Taking Active   cyclobenzaprine (FLEXERIL) 10 MG tablet [id number] Yes Take 0.5-1 tablets (5-10 mg total) by mouth 3 (three) times daily as needed. For headache Tower, Wynelle Fanny, MD Taking Active   ezetimibe (ZETIA) 10 MG tablet [id number] Yes Take 1 tablet (10 mg total) by mouth daily. Gerrie Nordmann, NP Taking Active   fluticasone (FLONASE) 50 MCG/ACT nasal spray [id number] Yes Place into both nostrils. [provider] Taking Active   furosemide (LASIX) 20 MG tablet [id number] Yes TAKE 1 TABLET (20 MG TOTAL) BY MOUTH EVERY OTHER DAY. AND AS NEEDED FOR SWELLING OR SHORTNESS OF BREATH Gollan, Kathlene November, MD Taking Active   isosorbide mononitrate (IMDUR) 30 MG 24 hr tablet [id number] Yes Take 0.5 tablets (15 mg total) by mouth daily. Gerrie Nordmann, NP Taking Active   lisinopril (ZESTRIL) 5 MG tablet [id number] Yes Take 2.5 mg by mouth daily. [provider] Taking Active   magnesium gluconate (MAGONATE) 500 MG tablet [id number] Yes Take 500 mg by mouth at bedtime. [provider] Taking Active Self  Melatonin 5 MG SUBL [id number] Yes Place under the tongue at bedtime as needed. [provider] Taking Active   nitroGLYCERIN (NITROSTAT) 0.4 MG SL tablet [id number] Yes Place 1 tablet (0.4 mg total) under the tongue every 5 (five) minutes as needed for chest pain. Minna Merritts, MD Taking Active   omeprazole (PRILOSEC) 40 MG capsule [id number] Yes TAKE 1 CAPSULE EVERY DAY Tower, Wynelle Fanny, MD Taking Active   polyethylene glycol powder (GLYCOLAX/MIRALAX) 17 GM/SCOOP powder [id number] Yes Take 17 g by mouth as needed. [name] D, MD Taking Active   potassium chloride (KLOR-CON M) 10 MEQ tablet [id number] Yes TAKE 1 TABLET (10 MEQ TOTAL) BY MOUTH EVERY OTHER DAY. TAKE WITH LASIX Gollan, Kathlene November, MD Taking Active   promethazine (PHENERGAN) 25 MG tablet [id number] Yes Take 0.5 tablets (12.5 mg total) by mouth every 8 (eight) hours as needed for nausea. Tower, Wynelle Fanny, MD Taking Active   Simethicone (MYLANTA GAS PO) [id number] Yes Take by mouth 3 times/day as needed-between meals & bedtime. [provider] Taking Active   TART CHERRY PO [id number] Yes Take 1 capsule by mouth 2 (two) times daily. [provider] Taking Active   venlafaxine XR (EFFEXOR-XR) 150 MG 24 hr capsule [id number] Yes TAKE 1 CAPSULE EVERY DAY WITH BREAKFAST Tower, Wynelle Fanny, MD Taking Active   venlafaxine XR (EFFEXOR-XR) 37.5 MG 24 hr capsule [id number] Yes TAKE 1 CAPSULE (37.5 MG TOTAL) BY MOUTH DAILY WITH BREAKFAST. Tower, Wynelle Fanny, MD Taking Active             Patient Active Problem List   Diagnosis Date Noted   Fatigue 10/26/2022   B12 deficiency 10/26/2022   Right shoulder pain 09/15/2022   Depression 09/14/2022   Class 2 severe obesity with serious comorbidity and body mass index (BMI) of 35.0 to 35.9 in adult [facility]) 09/14/2022   Other constipation 05/12/2022   Medicare annual wellness visit, initial 07/17/2021   TMJ (  dislocation of temporomandibular joint) 07/17/2021   Encounter for screening mammogram for breast cancer 06/05/2021   Diarrhea 04/11/2020   Epigastric pain 02/19/2020   Dark stools 02/19/2020   PAF (paroxysmal atrial fibrillation) ([place]) 02/05/2019   Shortness of breath 01/27/2019   Irregular surface of cornea 12/25/2018   HPV (human papilloma virus) infection 12/01/2016   Screening mammogram, encounter for 11/29/2016   Estrogen deficiency 11/29/2016   Encounter for routine gynecological examination 11/29/2016   Grade III hemorrhoids 10/06/2016   Tachycardia 08/14/2016   Hemorrhoids 08/13/2016    Atypical migraine 08/03/2016   CAD (coronary artery disease)    AAA (abdominal aortic aneurysm) (HCC)    Chronic combined systolic (congestive) and diastolic (congestive) heart failure ([place])    Prediabetes 02/11/2016   Generalized anxiety disorder 12/08/2015   Panic attacks 07/28/2015   Chest pain 07/09/2013   Sensorineural hearing loss 03/27/2013   Vitamin D deficiency 07/03/2012   H/O aortic dissection 05/12/2012   Routine general medical examination at a health care facility 09/20/2011   Atypical chest pain 09/15/2011   S/P AVR (aortic valve replacement) 01/07/2011   CORONARY ARTERY BYPASS GRAFT, HX OF 01/07/2011   Arthritis 12/20/2010   Back pain 12/15/2010   H/O dissecting abdominal aortic aneurysm repair 08/24/2010   IRRITABLE BOWEL SYNDROME 09/09/2009   Obstructive sleep apnea 08/04/2009   External hemorrhoid 06/26/2009   Osteoporosis 01/09/2009   Hyperlipidemia 07/26/2007   Depression with anxiety 07/26/2007   Essential hypertension 07/26/2007   Allergic rhinitis 07/26/2007   Asthma 07/26/2007   GERD 07/26/2007   Osteoarthritis 07/26/2007   Ocular migraine 07/26/2007   Mild intermittent asthma without complication 65/99/3570    Immunization History  Administered Date(s) Administered   H1N1 01/09/2009   Influenza Split 09/20/2011, 09/11/2012   Influenza Whole 12/29/2006, 09/19/2008, 10/08/2010   Influenza, Seasonal, Injecte, Preservative Fre 10/08/2010   Influenza,inj,Quad PF,6+ Mos 10/08/2013, 08/13/2016, 10/27/2017, 10/05/2018, 09/17/2019   Influenza-Unspecified 09/26/2015   PFIZER Comirnaty(Gray Top)Covid-19 Tri-Sucrose Vaccine 05/15/2021   PFIZER(Purple Top)SARS-COV-2 Vaccination 03/12/2020, 04/02/2020, 10/10/2020   Pneumococcal Polysaccharide-23 12/29/2006   Td 01/09/2009, 04/03/2019    Conditions to be addressed/monitored:  Hypertension, Hyperlipidemia, Heart Failure, Coronary Artery Disease, Depression, Anxiety, Osteoporosis, and Osteoarthritis  Care  Plan : [place]  Updates made by Charlton Haws, [place] since 12/09/2022 12:00 AM     Problem: Hypertension, Hyperlipidemia, Heart Failure, Coronary Artery Disease, Depression, Anxiety, Osteoporosis, and Osteoarthritis   Priority: High     Long-Range Goal: Disease Managament   Start Date: 06/04/2022  Expected End Date: 06/05/2023  This Visit's Progress: On track  Recent Progress: On track  Priority: High  Note:   Current Barriers:  Headaches with isosorbide Weight gain  Pharmacist Clinical Goal(s):  Patient will contact provider office for questions/concerns as evidenced notation of same in electronic health record through collaboration with PharmD and provider.   Interventions:  1:1 collaboration with Tower, Wynelle Fanny, MD regarding development and update of comprehensive plan of care as evidenced by provider attestation and co-signature  Inter-disciplinary care team collaboration (see longitudinal plan of care)  Comprehensive medication review performed; medication list updated in electronic medical record  Hyperlipidemia / CAD (LDL goal < 70) -Controlled- LDL 53 (10/2022) at goal, improved with addition of ezetimibe; she cannot tolerate daily atorvastatin due to cramps, she will sometimes stop atorvastatin for a few weeks at a time when cramps flare up;  -Isosorbide was started 10/2022 for chest pain, pt reports some improvement in chest pain but she is  having near daily headaches which are improved with Tylenol; discussed headaches are common with nitrates and if this becomes unbearable for her she can contact cardiology for alternative (ie, Ranexa) -Hx CAD (s/p CABG 2011); h/o aortic dissection -Current treatment: Aspirin EC 81 mg tablet daily - Appropriate, Effective, Safe, Accessible Krill Oil 500 mg CAPS daily - Appropriate, Effective, Safe, Accessible Atorvastatin 20 mg 4x weekly - Appropriate, Query Effective Ezetimibe 10 mg daily - Appropriate, Effective,  Safe, Accessible Isosorbide MN 15 mg daily - Appropriate, Effective, Query Safe -Medications previously tried: atorvastatin daily (leg cramps), pravastatin  -Recommend continue current medications  Heart Failure / Hypertension (Goal: BP < 130/80) -Controlled - reports weight gain due to stress, she does not weigh herself much due to history of anorexia, that is a trigger for her; she judges fluid status based on swelling and has increased furosemide from PRN to daily this year due to increased swelling (comfort eating in s/o increased caregiver stress) -Last ejection fraction: 45-50% (Date: 09/2022) -HF type: HFmrEF (mildly reduced EF 41-49%) -NYHA Class: II (slight limitation of activity) -Current treatment: Furosemide 20 mg daily - Appropriate, Effective, Safe, Accessible Carvedilol 6.25 mg BID - Appropriate, Effective, Safe, Accessible Lisinopril 5 mg - 1/2 tab QOD - Appropriate, Effective, Safe, Accessible Potassium chloride 10 mEq BID - Appropriate, Effective, Safe, Accessible -Medications previously tried: none -Reviewed recent BMP is stable WNL with change in diuretic use, no changes necessary; advised pt to contact cardiology if she changes diuretic use again in future -Recommend Continue current medications   Osteoporosis (Goal prevent fractures) -Not ideally controlled - discussed Sept 2022 with PCP, candidate for Reclast or Prolia - avoid starting medication until after dental work is complete -Last DEXA Scan: 07/2021   T-Score femoral neck: -2.2  T-Score total hip: -2.0  T-Score forearm radius: -2.9 -Patient is a candidate for pharmacologic treatment due to T-Score < -2.5 -Current treatment  Vitamin D 2000 IU 4x weekly -Medications previously tried: alendronate  -Recommend [phone] units of vitamin D daily. Recommend 1200 mg of calcium daily from dietary and supplemental sources. Recommend weight-bearing and muscle strengthening exercises for building and maintaining bone  density.  Depression/Anxiety (Goal: manage symptoms) -Not ideally controlled - she has been under increased stress lately (caregiver for her husband with Parkinsons); she gets 30 alprazolam per year -PHQ9: 28 (06/2022) - Moderately severe depression -GAD7: 13 (06/2022) - moderate anxiety -Connected with [place] and  counselor Bauert (LCSW) for mental health support -Current treatment: Alprazolam 0.5 mg daily PRN - Appropriate, Effective, Safe, Accessible Venlafaxine XR 150 mg daily - Appropriate, Effective, Safe, Accessible Venlafaxine XR 37.5 mg daily - Appropriate, Effective, Safe, Accessible Hydroxyzine 12.5 mg TID PRN -Appropriate, Effective, Safe, Accessible -Medications previously tried/failed: n/a -Educated on Benefits of medication for symptom control -Recommended to continue current medication  Osteoarthritis (Goal: manage pain) -Controlled - per pt report -Current treatment  Tylenol 500 mg PRN - Appropriate, Effective, Safe, Accessible Naproxen 220 mg PRN -Appropriate, Effective, Safe, Accessible Tart cherry - Appropriate, Effective, Safe, Accessible -Medications previously tried: n/a  -Recommended to continue current medication  Atypical migraine (Goal: reduce frequency) -Controlled  -massages help with migraines -Current treatment  Cyclobenzaprine 10 mg PRN -Appropriate, Effective, Safe, Accessible Promethazine 25 mg PRN -Appropriate, Effective, Safe, Accessible Tylenol 500 mg PRN -Appropriate, Effective, Safe, Accessible Naproxen 220 mg PRN -Appropriate, Effective, Safe, Accessible -Medications previously tried: n/a  -Recommended to continue current medication  Weight mgmt  -Not ideally controlled - pt has gained ~  20 lbs since May 2023, in s/o of increased stress and comfort eating. Recently pt's husband was approved for 100% [employer] benefits so much of her stress will alleviate. Encouraged improved self care and eating habits in the  future. -Follows with [facility] weight mgmt -Medications previously tried: [place] Maintenance -Vaccine gaps: Shingrix #2 (first dose 09/01/21) -Hx Afib -Hx asthma, stable. Keeps albuterol on hand. -Insomnia: takes melatonin, magnesium  Patient Goals/Self-Care Activities Patient will:  - take medications as prescribed as evidenced by patient report and record review focus on medication adherence by routine      Medication Assistance: None required.  Patient affirms current coverage meets needs.  Compliance/Adherence/Medication fill history: Care Gaps: Mammogram (due 08/06/22) AWV (due 10/29/22)  Star-Rating Drugs: Atorvastatin - PDC 100% Lisinopril - PDC 100%  Medication Access: Within the past 30 days, how often has patient missed a dose of medication? 0 Is a pillbox or other method used to improve adherence? Yes  Factors that may affect medication adherence?  Pharmacy error Are meds synced by current pharmacy? No  Are meds delivered by current pharmacy? Yes  Does patient experience delays in picking up medications due to transportation concerns? No   [facility] Reviewed: Is patient disadvantaged to use [facility]?: Yes  Current Rx insurance plan: [employer] [place] Name and location of Current pharmacy:  [facility] [address], [place] [address] Phone: [phone] Fax: [phone]  [facility] [id number] [place], [place] [facility] AT [facility] [facility] [address] [place] [address] Phone: [phone] Fax: [phone]  [facility] services reviewed with patient today?: No  Patient requests to transfer care to [facility]?: No  Reason patient declined to change pharmacies: Disadvantaged due to insurance/mail order   Care Plan and Follow Up Patient Decision:  Patient agrees to Care Plan and Follow-up.  Plan: Telephone follow up appointment with  care management team member scheduled for:  6 months  Charlene Brooke, PharmD, BCACP Clinical Pharmacist [facility] at [facility] [phone]

## 2022-12-04 ENCOUNTER — Other Ambulatory Visit: Payer: Self-pay | Admitting: Family Medicine

## 2022-12-06 ENCOUNTER — Telehealth (INDEPENDENT_AMBULATORY_CARE_PROVIDER_SITE_OTHER): Payer: Medicare HMO | Admitting: Psychology

## 2022-12-06 DIAGNOSIS — F4320 Adjustment disorder, unspecified: Secondary | ICD-10-CM

## 2022-12-06 DIAGNOSIS — F5089 Other specified eating disorder: Secondary | ICD-10-CM | POA: Diagnosis not present

## 2022-12-06 NOTE — Progress Notes (Unsigned)
Chief Complaint:   OBESITY Rachel Torres is here to discuss her progress with her obesity treatment plan along with follow-up of her obesity related diagnoses. [name] is on practicing portion control and making smarter food choices, such as increasing vegetables and decreasing simple carbohydrates and states she is following her eating plan approximately 50% of the time. [name] states she is doing 0 minutes 0 times per week.  Today's visit was #: 69 Starting weight: 213 lbs Starting date: 05/05/2020 Today's weight: 194 lbs Today's date: 11/24/2022 Total lbs lost to date: 19 Total lbs lost since last in-office visit: 0  Interim History: [name] has done more celebration eating since her las visit. She had Thanksgiving and 2 Christmas parties already. She has more celebrations coming up in the next 3 weeks, and she is just trying to avoid further weight gain. Her goal is to portion control.   Subjective:   1. Prediabetes [name]'s last A1c was elevated at 5.8. she continues to work on her weight loss but she has had challenges recently. She is not on metformin, and she is trying to improve with diet and exercise. I discussed labs with the patient today.   Assessment/Plan:   1. Prediabetes Dustie will continue to work on decreasing simple carbohydrates, and we will recheck labs in 3 months.   2. Obesity, Current BMI 32.4 [name] is currently in the action stage of change. As such, her goal is to continue with weight loss efforts. She has agreed to practicing portion control and making smarter food choices, such as increasing vegetables and decreasing simple carbohydrates.   Behavioral modification strategies: increasing lean protein intake, decreasing simple carbohydrates, and holiday eating strategies .  [name] has agreed to follow-up with our clinic in 6 weeks. She was informed of the importance of frequent follow-up visits to maximize her success with intensive lifestyle  modifications for her multiple health conditions.   Objective:   Blood pressure 111/65, pulse 75, temperature 97.6 F (36.4 C), height '5\' 5"'$  (1.651 m), weight 194 lb (88 kg), SpO2 97 %. Body mass index is 32.28 kg/m.  General: Cooperative, alert, well developed, in no acute distress. HEENT: Conjunctivae and lids unremarkable. Cardiovascular: Regular rhythm.  Lungs: Normal work of breathing. Neurologic: No focal deficits.   Lab Results  Component Value Date   CREATININE 0.87 10/26/2022   BUN 16 10/26/2022   NA 139 10/26/2022   K 4.5 10/26/2022   CL 102 10/26/2022   CO2 25 10/26/2022   Lab Results  Component Value Date   ALT 21 10/26/2022   AST 22 10/26/2022   ALKPHOS 76 10/26/2022   BILITOT 0.5 10/26/2022   Lab Results  Component Value Date   HGBA1C 5.8 (H) 10/26/2022   HGBA1C 5.5 03/17/2022   HGBA1C 5.5 12/03/2021   HGBA1C 5.6 07/10/2021   HGBA1C 5.5 02/26/2021   Lab Results  Component Value Date   INSULIN 11.7 10/26/2022   INSULIN 4.5 12/03/2021   INSULIN 17.3 02/26/2021   INSULIN 13.9 10/29/2020   INSULIN 22.5 07/23/2020   Lab Results  Component Value Date   TSH 1.280 10/26/2022   Lab Results  Component Value Date   CHOL 144 10/26/2022   HDL 79 10/26/2022   LDLCALC 53 10/26/2022   LDLDIRECT 189.4 10/13/2011   TRIG 59 10/26/2022   CHOLHDL 3 03/17/2022   Lab Results  Component Value Date   VD25OH 57.6 10/26/2022   VD25OH 57.7 12/03/2021   VD25OH 74.87 07/10/2021   Lab Results  Component Value Date   WBC 5.1 10/26/2022   HGB 13.6 10/26/2022   HCT 42.0 10/26/2022   MCV 98 (H) 10/26/2022   PLT 333 10/26/2022   Lab Results  Component Value Date   FERRITIN 105.0 12/11/2010   Attestation Statements:   Reviewed by clinician on day of visit: allergies, medications, problem list, medical history, surgical history, family history, social history, and previous encounter notes.  Time spent on visit including pre-visit chart review and post-visit  care and charting was 20 minutes.   I, Trixie Dredge, am acting as transcriptionist for [name], MD.  I have reviewed the above documentation for accuracy and completeness, and I agree with the above. -  [name], MD

## 2022-12-06 NOTE — Progress Notes (Signed)
  Office: [phone]  /  Fax: [phone]    Date: December 06, 2022    Appointment Start Time: 8:06am Duration: 26 minutes Provider: Glennie Isle, Psy.D. Type of Session: Individual Therapy  Location of Patient: Home (private location) Location of Provider: Provider's Home (private office) Type of Contact: Telepsychological Visit via MyChart Video Visit  Session Content: This provider called Alishah at 8:05am as she did not present for today's appointment. She stated it has been "a crazy morning," but noted she could join. As such, today's appointment was initiated 6 minutes late.Julio is a 66 y.o. female presenting for a follow-up appointment to address the previously established treatment goal of increasing coping skills.Today's appointment was a telepsychological visit. Marcee provided verbal consent for today's telepsychological appointment and she is aware she is responsible for securing confidentiality on her end of the session. Prior to proceeding with today's appointment, Velena's physical location at the time of this appointment was obtained as well a phone number she could be reached at in the event of technical difficulties. Ragena and this provider participated in today's telepsychological service.   This provider conducted a brief check-in. [name] stated the "deposits from the [place] finally came thru this morning." She described experiencing relief regarding financial concerns; however, feeling "overwhelmed." Further explored and processed. Discussed recent eating habits. Envi acknowledged, "My eating is still all over the place." However, she noted an increase in protein intake. Positive reinforcement was provided. To further help her eat regularly and congruent to her goal(s) with the clinic, she was engaged in goal setting. Psychoeducation regarding SMART goals was provided and Chloee was engaged in goal setting. The following goal was established: Gianina will eat lunch  congruent to parameters set forth by Dr. Leafy Ro at least 4 out of 7 days between now and the next appointment with this provider. Possible barriers/obstacles were explored. Overall, Petrea was receptive to today's appointment as evidenced by openness to sharing, responsiveness to feedback, and willingness to work toward the established SMART goal.  Mental Status Examination:  Appearance: neat Behavior: appropriate to circumstances Mood: neutral Affect: mood congruent Speech: WNL Eye Contact: appropriate Psychomotor Activity: WNL Gait: unable to assess Thought Process: linear, logical, and goal directed and no evidence or endorsement of suicidal, homicidal, and self-harm ideation, plan and intent  Thought Content/Perception: no hallucinations, delusions, bizarre thinking or behavior endorsed or observed Orientation: AAOx4 Memory/Concentration: memory, attention, language, and fund of knowledge intact  Insight: fair Judgment: fair  Interventions:  Conducted a brief chart review Provided empathic reflections and validation Reviewed content from the previous session Employed supportive psychotherapy interventions to facilitate reduced distress and to improve coping skills with identified stressors Engaged patient in goal setting Psychoeducation provided regarding SMART goals Positive reinforcement   DSM-5 Diagnosis(es):  F50.89 Other Specified Feeding or Eating Disorder, Emotional Eating Behaviors and F43.20 Adjustment Disorder, Unspecified   Treatment Goal & Progress: During the initial appointment with this provider, the following treatment goal was established: increase coping skills. Elizzie has demonstrated progress in her goal as evidenced by willingness to remind herself of all or nothing thinking, utilizing the hunger and satisfaction scale, and engaging in self-care. Mita also continues to demonstrate willingness to engage in learned skill(s).  Plan: The next appointment is  scheduled for 12/27/2022 at 8am, which will be via [place] Visit. The next session will focus on working towards the established treatment goal. Rockie will continue with her primary therapist.

## 2022-12-08 ENCOUNTER — Telehealth: Payer: Self-pay | Admitting: Cardiovascular Disease

## 2022-12-08 NOTE — Telephone Encounter (Signed)
STAT if HR is under 50 or over 120 (normal HR is 60-100 beats per minute)  What is your heart rate? Over 100 overnight in sleep; watch told patient was in afib as well  Do you have a log of your heart rate readings (document readings)? Yes   Do you have any other symptoms? Feel heartbeat   Patient wants to know if she can get an appt today or tomorrow

## 2022-12-08 NOTE — Telephone Encounter (Signed)
Patient calling in reporting fast heart rates throughout the night. Highest rate was 120 with a couple of episodes of afib throughout the night. When she checked ECG it just listed fast heart rate. She has previously had ablation over 25 years ago and this is the first time she has had any problems with afib. She is presently leaving to take her husband to the [place] hospital but wanted to check in with [place] for possible appointment. Reviewed appointment options and she was agreeable to see our DOD provider tomorrow here in the office. We did discuss strict ED precautions and she verbalized understanding with no further questions at that time.

## 2022-12-09 ENCOUNTER — Encounter: Payer: Self-pay | Admitting: Internal Medicine

## 2022-12-09 ENCOUNTER — Ambulatory Visit (INDEPENDENT_AMBULATORY_CARE_PROVIDER_SITE_OTHER): Payer: Medicare HMO

## 2022-12-09 ENCOUNTER — Ambulatory Visit: Payer: Medicare HMO | Attending: Internal Medicine | Admitting: Internal Medicine

## 2022-12-09 VITALS — BP 116/80 | HR 77 | Ht 65.0 in | Wt 204.0 lb

## 2022-12-09 DIAGNOSIS — R002 Palpitations: Secondary | ICD-10-CM | POA: Diagnosis not present

## 2022-12-09 DIAGNOSIS — R0602 Shortness of breath: Secondary | ICD-10-CM | POA: Diagnosis not present

## 2022-12-09 DIAGNOSIS — R079 Chest pain, unspecified: Secondary | ICD-10-CM

## 2022-12-09 DIAGNOSIS — R000 Tachycardia, unspecified: Secondary | ICD-10-CM

## 2022-12-09 NOTE — Patient Instructions (Signed)
Visit Information  Phone number for Pharmacist: [phone]   Goals Addressed   None     Care Plan : [place]  Updates made by Charlton Haws, [facility] since 12/09/2022 12:00 AM     Problem: Hypertension, Hyperlipidemia, Heart Failure, Coronary Artery Disease, Depression, Anxiety, Osteoporosis, and Osteoarthritis   Priority: High     Long-Range Goal: Disease Managament   Start Date: 06/04/2022  Expected End Date: 06/05/2023  This Visit's Progress: On track  Recent Progress: On track  Priority: High  Note:   Current Barriers:  Headaches with isosorbide Weight gain  Pharmacist Clinical Goal(s):  Patient will contact provider office for questions/concerns as evidenced notation of same in electronic health record through collaboration with PharmD and provider.   Interventions:  1:1 collaboration with [place], Wynelle Fanny, MD regarding development and update of comprehensive plan of care as evidenced by provider attestation and co-signature  Inter-disciplinary care team collaboration (see longitudinal plan of care)  Comprehensive medication review performed; medication list updated in electronic medical record  Hyperlipidemia / CAD (LDL goal < 70) -Controlled- LDL 53 (10/2022) at goal, improved with addition of ezetimibe; she cannot tolerate daily atorvastatin due to cramps, she will sometimes stop atorvastatin for a few weeks at a time when cramps flare up;  -Isosorbide was started 10/2022 for chest pain, pt reports some improvement in chest pain but she is having near daily headaches which are improved with Tylenol; discussed headaches are common with nitrates and if this becomes unbearable for her she can contact cardiology for alternative (ie, Ranexa) -Hx CAD (s/p CABG 2011); h/o aortic dissection -Current treatment: Aspirin EC 81 mg tablet daily - Appropriate, Effective, Safe, Accessible Krill Oil 500 mg CAPS daily - Appropriate, Effective, Safe,  Accessible Atorvastatin 20 mg 4x weekly - Appropriate, Query Effective Ezetimibe 10 mg daily - Appropriate, Effective, Safe, Accessible Isosorbide MN 15 mg daily - Appropriate, Effective, Query Safe -Medications previously tried: atorvastatin daily (leg cramps), pravastatin  -Recommend continue current medications  Heart Failure / Hypertension (Goal: BP < 130/80) -Controlled - reports weight gain due to stress, she does not weigh herself much due to history of anorexia, that is a trigger for her; she judges fluid status based on swelling and has increased furosemide from PRN to daily this year due to increased swelling (comfort eating in s/o increased caregiver stress) -Last ejection fraction: 45-50% (Date: 09/2022) -HF type: HFmrEF (mildly reduced EF 41-49%) -NYHA Class: II (slight limitation of activity) -Current treatment: Furosemide 20 mg daily - Appropriate, Effective, Safe, Accessible Carvedilol 6.25 mg BID - Appropriate, Effective, Safe, Accessible Lisinopril 5 mg - 1/2 tab QOD - Appropriate, Effective, Safe, Accessible Potassium chloride 10 mEq BID - Appropriate, Effective, Safe, Accessible -Medications previously tried: none -Reviewed recent BMP is stable WNL with change in diuretic use, no changes necessary; advised pt to contact cardiology if she changes diuretic use again in future -Recommend Continue current medications   Osteoporosis (Goal prevent fractures) -Not ideally controlled - discussed Sept 2022 with PCP, candidate for Reclast or Prolia - avoid starting medication until after dental work is complete -Last DEXA Scan: 07/2021   T-Score femoral neck: -2.2  T-Score total hip: -2.0  T-Score forearm radius: -2.9 -Patient is a candidate for pharmacologic treatment due to T-Score < -2.5 -Current treatment  Vitamin D 2000 IU 4x weekly -Medications previously tried: alendronate  -Recommend [phone] units of vitamin D daily. Recommend 1200 mg of calcium daily from dietary and  supplemental sources. Recommend weight-bearing  and muscle strengthening exercises for building and maintaining bone density.  Depression/Anxiety (Goal: manage symptoms) -Not ideally controlled - she has been under increased stress lately (caregiver for her husband with Parkinsons); she gets 30 alprazolam per year -PHQ9: 74 (06/2022) - Moderately severe depression -GAD7: 13 (06/2022) - moderate anxiety -Connected with [place] and  counselor Bauert (LCSW) for mental health support -Current treatment: Alprazolam 0.5 mg daily PRN - Appropriate, Effective, Safe, Accessible Venlafaxine XR 150 mg daily - Appropriate, Effective, Safe, Accessible Venlafaxine XR 37.5 mg daily - Appropriate, Effective, Safe, Accessible Hydroxyzine 12.5 mg TID PRN -Appropriate, Effective, Safe, Accessible -Medications previously tried/failed: n/a -Educated on Benefits of medication for symptom control -Recommended to continue current medication  Osteoarthritis (Goal: manage pain) -Controlled - per pt report -Current treatment  Tylenol 500 mg PRN - Appropriate, Effective, Safe, Accessible Naproxen 220 mg PRN -Appropriate, Effective, Safe, Accessible Tart cherry - Appropriate, Effective, Safe, Accessible -Medications previously tried: n/a  -Recommended to continue current medication  Atypical migraine (Goal: reduce frequency) -Controlled  -massages help with migraines -Current treatment  Cyclobenzaprine 10 mg PRN -Appropriate, Effective, Safe, Accessible Promethazine 25 mg PRN -Appropriate, Effective, Safe, Accessible Tylenol 500 mg PRN -Appropriate, Effective, Safe, Accessible Naproxen 220 mg PRN -Appropriate, Effective, Safe, Accessible -Medications previously tried: n/a  -Recommended to continue current medication  Weight mgmt  -Not ideally controlled - pt has gained ~20 lbs since May 2023, in s/o of increased stress and comfort eating. Recently pt's husband was approved for 100% [employer]  benefits so much of her stress will alleviate. Encouraged improved self care and eating habits in the future. -Follows with [facility] weight mgmt -Medications previously tried: [place] Maintenance -Vaccine gaps: Shingrix #2 (first dose 09/01/21) -Hx Afib -Hx asthma, stable. Keeps albuterol on hand. -Insomnia: takes melatonin, magnesium  Patient Goals/Self-Care Activities Patient will:  - take medications as prescribed as evidenced by patient report and record review focus on medication adherence by routine      Patient verbalizes understanding of instructions and care plan provided today and agrees to view in [place]. Active MyChart status and patient understanding of how to access instructions and care plan via MyChart confirmed with patient.    Telephone follow up appointment with pharmacy team member scheduled for: 6 months  Charlene Brooke, PharmD, [facility] Clinical Pharmacist [facility] [phone]

## 2022-12-09 NOTE — Patient Instructions (Signed)
Medication Instructions:  Your Physician recommend you continue on your current medication as directed.    *If you need a refill on your cardiac medications before your next appointment, please call your pharmacy*   Lab Work: None ordered today   Testing/Procedures: Your physician has recommended that you wear a 14 day Zio AT Live monitor.   This monitor is a medical device that records the heart's electrical activity. Doctors most often use these monitors to diagnose arrhythmias. Arrhythmias are problems with the speed or rhythm of the heartbeat. The monitor is a small device applied to your chest. You can wear one while you do your normal daily activities. While wearing this monitor if you have any symptoms to push the button and record what you felt. Once you have worn this monitor for the period of time provider prescribed (Usually 14 days), you will return the monitor device in the postage paid box/bag. Once it is returned they will download the data collected and provide [place] with a report which the provider will then review and we will call you with those results.   Important tips:  Avoid showering during the first 24 hours of wearing the monitor. Avoid excessive sweating to help maximize wear time. Do not submerge the device, no hot tubs, and no swimming pools. Keep any lotions or oils away from the patch. After 24 hours you may shower with the patch on. Take brief showers with your back facing the shower head.  Do not remove patch once it has been placed because that will interrupt data and decrease adhesive wear time. Push the button when you have any symptoms and write down what you were feeling. Once you have completed wearing your monitor, remove and place into box which has postage paid and place in your outgoing mailbox.  If for some reason you have misplaced your box then call our office and we can provide another box and/or mail it off for you. Keep the transmitter within 10  feet at all times.  Expect a welcome phone call within 40-98 hrs of application from [place].  This call will include your copay information, so please answer any unknown phone calls while wearing Zio (it could also be important information about your heart) The envelope to return Zio is in the back of the transmitter. Removal instructions are on the last page of the symptom diary.  Place the patch sticky side up inside the transmitter and the symptom diary inside the envelope to return on your last wear day inside your mailbox or any USPS mailbox.    Follow-Up: At [facility], you and your health needs are our priority.  As part of our continuing mission to provide you with exceptional heart care, we have created designated Provider Care Teams.  These Care Teams include your primary Cardiologist (physician) and Advanced Practice Providers (APPs -  Physician Assistants and Nurse Practitioners) who all work together to provide you with the care you need, when you need it.  We recommend signing up for the patient portal called "MyChart".  Sign up information is provided on this After Visit Summary.  MyChart is used to connect with patients for Virtual Visits (Telemedicine).  Patients are able to view lab/test results, encounter notes, upcoming appointments, etc.  Non-urgent messages can be sent to your provider as well.   To learn more about what you can do with MyChart, go to [email or web address].    Your next appointment:   6 week(s)  The format  for your next appointment:   In Person  Provider:   You may see [name], MD or one of the following Advanced Practice Providers on your designated Care Team:   Murray Hodgkins, NP Christell Faith, PA-C Cadence Kathlen Mody, PA-C Gerrie Nordmann, NP

## 2022-12-09 NOTE — Progress Notes (Signed)
Follow-up Outpatient Visit Date: 12/09/2022  Primary Care Provider: [name], MD [place] [address]  Primary Cardiologist: Esmond Plants, MD PhD  Chief Complaint: Palpitations and elevated heart rates  HPI:  Rachel Torres is a 66 y.o. female with history of coronary artery disease status post CABG, type A aortic dissection in the setting of possible Marfan syndrome status post repair and AVR, HFrEF with recovered ejection fraction, hypertension, hyperlipidemia, chronic dyspnea, obstructive sleep apnea, and rheumatoid arthritis and chronic back pain, who is seen today for urgent evaluation of tachycardia and concern for atrial fibrillation.  Multiple notes by noncardiology providers suggest a history of atrial fibrillation around 2020.  However, atrial fibrillation has not been confirmed by Dr. [name] at previous visits.  She reached out to our office yesterday concerned about elevated heart rates and potential for atrial fibrillation.  She reports having undergone some sort of an ablation over 25 years ago.  Rachel Torres is concerned about an episode of palpitations and elevated heart rates that occurred 2 days ago.  She had been feeling fine and suddenly noticed her heart speeding up.  It stayed that way for a few hours, ultimately abating after she took alprazolam to help relax.  This was an isolated episode.  She took 3 rhythm strips with her Apple Watch.  The first 2 were concerning for atrial fibrillation.  She had been feeling fairly well otherwise that day though she had mild chest pain just before the elevation in her heart rate.  The discomfort radiated to her neck and resolved with belching.  She also felt a little short of breath with the elevated heart rates.  Echocardiogram and stress test performed in October for similar symptoms were reassuring, with LVEF having improved to 45-50% without evidence of ischemia on the perfusion imaging.  Rachel Torres consumes  minimal caffeine and no alcohol.  She remains compliant with her medications, though she notes that she is now taking lisinopril 2.5 mg daily rather than every other day.  Her left arm has been bothering her today as well, after she tripped over her cat and caught herself awkwardly with the left arm.  --------------------------------------------------------------------------------------------------  Past Medical History:  Diagnosis Date   AAA (abdominal aortic aneurysm) ([place])    a. Chronic w/o evidence of aneurysmal dil on CTA 01/2016.   Alcohol abuse    Allergy    Anemia    Anxiety    Asthma    CAD (coronary artery disease)    a. 08/2010 s/p CABG x 1 (VG->RCA) @ time of Ao dissection repair; b. 01/2016 Lexiscan MV: mid antsept/apical defect w/ ? peri-infarct ischemia-->likely attenuation-->Med Rx.   Chewing difficulty    Chronic combined systolic (congestive) and diastolic (congestive) heart failure ([facility] [place])    a. 2012 EF 30-35%; b. 04/2015 EF 40-45%; c. 01/2016 Echo: Ef 55-65%, nl AoV bioprosthesis, nl RV, nl PASP.   Constipation    Depression    Edema of both lower extremities    Gallbladder sludge    GERD (gastroesophageal reflux disease)    Heart failure (HCC)    Heart valve problem    History of Bicuspid Aortic Valve    a. 08/2010 s/p AVR @ time of Ao dissection repair; b. 01/2016 Echo: Ef 55-65%, nl AoV bioprosthesis, nl RV, nl PASP.   Hx of repair of dissecting thoracic aortic aneurysm, Stanford type A    a. 08/2010 s/p repair with AVR and VG->RCA; b. 01/2016 CTA: stable  appearance of Asc Thoracic Aortic graft. Opacification of flase lumen of chronic abd Ao dissection w/ retrograde flow through lumbar arteries. No aneurysmal dil of flase lumen.   Hyperlipidemia    Hypertension    Hypertensive heart disease    IBS (irritable bowel syndrome)    Internal hemorrhoids    Kidney problem    Marfan syndrome    pt denies   Migraine    Obstructive sleep apnea    Osteoarthritis     Osteoporosis    Palpitations    Pneumonia    RA (rheumatoid arthritis) ([place])    a. Followed by Dr. [name]   Past Surgical History:  Procedure Laterality Date   ABDOMINAL AORTIC ANEURYSM REPAIR     CARDIAC CATHETERIZATION  2001   [place]     ESOPHAGOGASTRODUODENOSCOPY (EGD) WITH PROPOFOL N/A 05/08/2020   Procedure: ESOPHAGOGASTRODUODENOSCOPY (EGD) WITH PROPOFOL;  Surgeon: Virgel Manifold, MD;  Location: [facility] ENDOSCOPY;  Service: Endoscopy;  Laterality: N/A;   FRACTURE SURGERY Right    shoulder replacement   HEMORRHOID BANDING     ORIF DISTAL RADIUS FRACTURE Left    UPPER GASTROINTESTINAL ENDOSCOPY      Current Meds  Medication Sig   acetaminophen (TYLENOL) 500 MG tablet Take 500 mg by mouth every 6 (six) hours as needed.   albuterol (VENTOLIN HFA) 108 (90 Base) MCG/ACT inhaler Inhale 1-2 puffs into the lungs every 6 (six) hours as needed for wheezing or shortness of breath.   ALPRAZolam (XANAX) 0.5 MG tablet Take 1 tablet (0.5 mg total) by mouth daily as needed for anxiety.   aspirin EC 81 MG tablet Take 81 mg by mouth at bedtime.   atorvastatin (LIPITOR) 20 MG tablet Take 20 mg by mouth. Four days per week   carvedilol (COREG) 6.25 MG tablet TAKE 1 TABLET TWICE DAILY WITH MEALS   Cholecalciferol (VITAMIN D3) 50 MCG (2000 UT) CAPS Take 2,000 capsules by mouth. FOUR TIMES A WEEK   clindamycin (CLEOCIN) 300 MG capsule 300 mg. AS NEEDED FOR DENTAL PROCEDURES   cyclobenzaprine (FLEXERIL) 10 MG tablet Take 0.5-1 tablets (5-10 mg total) by mouth 3 (three) times daily as needed. For headache   ezetimibe (ZETIA) 10 MG tablet Take 1 tablet (10 mg total) by mouth daily.   fluticasone (FLONASE) 50 MCG/ACT nasal spray Place into both nostrils.   furosemide (LASIX) 20 MG tablet TAKE 1 TABLET (20 MG TOTAL) BY MOUTH EVERY OTHER DAY. AND AS NEEDED FOR SWELLING OR SHORTNESS OF BREATH   isosorbide mononitrate (IMDUR) 30 MG 24 hr tablet  Take 0.5 tablets (15 mg total) by mouth daily.   lisinopril (ZESTRIL) 5 MG tablet Take 2.5 mg by mouth every other day.   magnesium gluconate (MAGONATE) 500 MG tablet Take 500 mg by mouth at bedtime.   Melatonin 5 MG SUBL Place under the tongue at bedtime as needed.   nitroGLYCERIN (NITROSTAT) 0.4 MG SL tablet Place 1 tablet (0.4 mg total) under the tongue every 5 (five) minutes as needed for chest pain.   omeprazole (PRILOSEC) 40 MG capsule TAKE 1 CAPSULE EVERY DAY   polyethylene glycol powder (GLYCOLAX/MIRALAX) 17 GM/SCOOP powder Take 17 g by mouth as needed.   potassium chloride (KLOR-CON M) 10 MEQ tablet TAKE 1 TABLET (10 MEQ TOTAL) BY MOUTH EVERY OTHER DAY. TAKE WITH LASIX   promethazine (PHENERGAN) 25 MG tablet Take 0.5 tablets (12.5 mg total) by mouth every 8 (eight) hours  as needed for nausea.   Simethicone (MYLANTA GAS PO) Take by mouth 3 times/day as needed-between meals & bedtime.   TART CHERRY PO Take 1 capsule by mouth 2 (two) times daily.   venlafaxine XR (EFFEXOR-XR) 150 MG 24 hr capsule TAKE 1 CAPSULE EVERY DAY WITH BREAKFAST   venlafaxine XR (EFFEXOR-XR) 37.5 MG 24 hr capsule TAKE 1 CAPSULE (37.5 MG TOTAL) BY MOUTH DAILY WITH BREAKFAST.    Allergies: Fosamax [alendronate sodium], Ginzing [ginseng], Hydrocod poli-chlorphe poli er, Hydrocodone-acetaminophen, Imitrex [sumatriptan], Oxycodone, Peanut-containing drug products, Tramadol, Hydrocodone-guaifenesin, Prednisone, and Tape  Social History   Tobacco Use   Smoking status: Former    Packs/day: 0.75    Years: 1.50    Total pack years: 1.13    Types: Cigarettes    Quit date: 12/20/1978    Years since quitting: 44.0   Smokeless tobacco: Never  Vaping Use   Vaping Use: Never used  Substance Use Topics   Alcohol use: Yes    Comment: 2-3 glasses of red wine per month   Drug use: No    Family History  Problem Relation Age of Onset   Hypertension Mother    Depression Mother    Osteoporosis Mother    Stroke Mother     Irritable bowel syndrome Mother    Asthma Mother    Heart disease Mother    Thyroid disease Mother    Anxiety disorder Mother    Other Father 51   Arthritis Sister    Diabetes Sister    Heart disease Sister    Asthma Sister    Migraines Sister    Diabetes Sister    Nephrolithiasis Sister    Osteoporosis Sister    Arthritis Sister    Asthma Sister    Migraines Sister    Diabetes Brother    Other Brother        prediabeties   Asthma Brother    Migraines Brother    Stroke Maternal Grandmother    Colon cancer Maternal Grandmother    Lymphoma Maternal Grandmother    Migraines Maternal Grandmother    Lung cancer Maternal Grandfather    Melanoma Maternal Grandfather    Aneurysm Paternal Grandmother        brain   Diabetes Paternal Grandmother    Arthritis Paternal Grandmother    Arthritis Paternal Grandfather    Diabetes Paternal Grandfather     Review of Systems: A 12-system review of systems was performed and was negative except as noted in the HPI.  --------------------------------------------------------------------------------------------------  Physical Exam: BP 116/80 (BP Location: Right Arm, Patient Position: Sitting, Cuff Size: Large)   Pulse 77   Ht '5\' 5"'$  (1.651 m)   Wt 204 lb (92.5 kg)   SpO2 98%   BMI 33.95 kg/m   General:  NAD. Neck: No JVD or HJR. Lungs: Clear to auscultation bilaterally without wheezes or crackles. Heart: Regular rate and rhythm with 1/6 systolic murmur.  No rubs or gallops. Abdomen: Soft, nontender, nondistended. Extremities: No lower extremity edema.  EKG: Normal sinus rhythm with first-degree AV block (PR interval 210 ms), borderline LVH, and nonspecific ST/T changes.  Compared with 12/25/2021, PR interval is slightly longer.  PACs are no longer present.  Lab Results  Component Value Date   WBC 5.1 10/26/2022   HGB 13.6 10/26/2022   HCT 42.0 10/26/2022   MCV 98 (H) 10/26/2022   PLT 333 10/26/2022    Lab Results  Component  Value Date   NA 139 10/26/2022  K 4.5 10/26/2022   CL 102 10/26/2022   CO2 25 10/26/2022   BUN 16 10/26/2022   CREATININE 0.87 10/26/2022   GLUCOSE 95 10/26/2022   ALT 21 10/26/2022    Lab Results  Component Value Date   CHOL 144 10/26/2022   HDL 79 10/26/2022   LDLCALC 53 10/26/2022   LDLDIRECT 189.4 10/13/2011   TRIG 59 10/26/2022   CHOLHDL 3 03/17/2022    --------------------------------------------------------------------------------------------------  ASSESSMENT AND PLAN: Palpitations and tachycardia: Rachel Torres had a single episode of elevated heart rates lasting a few hours earlier this week.  I have reviewed her Apple Watch tracings, which demonstrated a narrow complex tachycardia.  The first 2 tracings are irregular and could represent atrial fibrillation.  The third tracing is more consistent with SVT.  We have agreed to characterize this further with a 14-day event monitor (ZIO AT).  Recent stress test and echocardiogram were reassuring.  We will therefore defer repeating the studies.  Chest pain and shortness of breath: Symptoms are atypical and have been present off and on for several months.  Myocardial perfusion stress test and echocardiogram in October were both reassuring, with slight improvement in LVEF to 45-50%..  No further workup or medication changes are recommended at this time.  Follow-up: Return to clinic in ~6 weeks with Dr. [name] or APP.  Nelva Bush, MD 12/09/2022 9:41 AM

## 2022-12-10 ENCOUNTER — Encounter: Payer: Self-pay | Admitting: Internal Medicine

## 2022-12-10 DIAGNOSIS — R002 Palpitations: Secondary | ICD-10-CM | POA: Diagnosis not present

## 2022-12-11 DIAGNOSIS — R002 Palpitations: Secondary | ICD-10-CM | POA: Diagnosis not present

## 2022-12-14 ENCOUNTER — Ambulatory Visit: Payer: Medicare HMO | Admitting: Psychology

## 2022-12-21 ENCOUNTER — Ambulatory Visit: Payer: Medicare HMO | Admitting: Cardiology

## 2022-12-21 ENCOUNTER — Ambulatory Visit: Payer: Medicare HMO | Admitting: Psychology

## 2022-12-23 ENCOUNTER — Ambulatory Visit (INDEPENDENT_AMBULATORY_CARE_PROVIDER_SITE_OTHER): Payer: Medicare HMO | Admitting: Psychology

## 2022-12-23 DIAGNOSIS — F331 Major depressive disorder, recurrent, moderate: Secondary | ICD-10-CM | POA: Diagnosis not present

## 2022-12-23 NOTE — Progress Notes (Signed)
[facility] Counselor/Therapist Progress Note  Patient ID: Rachel Torres, MRN: [id number],    Date: 12/23/2022  Time Spent: 3:00pm-3:55pm    55 minutes   Treatment Type: Individual Therapy  Reported Symptoms: stress  Mental Status Exam: Appearance:  Casual     Behavior: Appropriate  Motor: Normal  Speech/Language:  Normal Rate  Affect: Appropriate  Mood: normal  Thought process: normal  Thought content:   WNL  Sensory/Perceptual disturbances:   WNL  Orientation: oriented to person, place, time/date, and situation  Attention: Good  Concentration: Good  Memory: WNL  Fund of knowledge:  Good  Insight:   Good  Judgment:  Good  Impulse Control: Good   Risk Assessment: Danger to Self:  No Self-injurious Behavior: No Danger to Others: No Duty to Warn:no Physical Aggression / Violence:No  Access to Firearms a concern: No  Gang Involvement:No   Subjective:  [name] present for face-to-face individual therapy via video Webex.  Pt consents to telehealth video session due to COVID 19 pandemic. Location of pt: home Location of therapist: home office.   Pt talked about the holidays.   Pt hosted Christmas for her family and it went well.   Pt talked about the house projects they need to do.  She feels somewhat overwhelmed but is grateful that they now have the money to take care of things.   Pt talked about caregiving for [name].  His cognitive issues are challenging to deal with.  Addressed the challenges.  Worked on problem solving and stress management.    Pt talked about a friend being very sick and in the hospital.   He may not live.  Helped pt process her feelings and grief.  Worked on self care strategies.   Provided supportive therapy.  Interventions: Cognitive Behavioral Therapy and Insight-Oriented  Diagnosis: F33.1   Plan of Care: Recommend ongoing therapy.   Pt participated in setting treatment goals.   Pt needs a place to talk about stressors.   Plan to meet every two weeks.    Treatment Plan (Treatment Plan Target Date: 03/23/2023) Client Abilities/Strengths  Pt is bright, engaging, and motivated for therapy.   Client Treatment Preferences  Individual therapy.  Client Statement of Needs  Improve coping skills.  Have a place to talk about stressors.   Symptoms  Depressed or irritable mood. Unresolved grief issues.  Problems Addressed  Unipolar Depression Goals 1. Alleviate depressive symptoms and return to previous level of effective functioning. 2. Appropriately grieve the loss in order to normalize mood and to return to previously adaptive level of functioning. Objective Learn and implement behavioral strategies to overcome depression. Target Date: 2023-03-23 Frequency: Biweekly  Progress: 10 Modality: individual  Related Interventions Engage the client in "behavioral activation," increasing his/her activity level and contact with sources of reward, while identifying processes that inhibit activation.  Use behavioral techniques such as instruction, rehearsal, role-playing, role reversal, as needed, to facilitate activity in the client's daily life; reinforce success. Assist the client in developing skills that increase the likelihood of deriving pleasure from behavioral activation (e.g., assertiveness skills, developing an exercise plan, less internal/more external focus, increased social involvement); reinforce success. Objective Identify important people in life, past and present, and describe the quality, good and poor, of those relationships. Target Date: 2023-03-23 Frequency: Biweekly  Progress: 10 Modality: individual  Related Interventions Conduct Interpersonal Therapy beginning with the assessment of the client's "interpersonal inventory" of important past and present relationships; develop a case formulation linking depression to  grief, interpersonal role disputes, role transitions, and/or interpersonal  deficits). Objective Learn and implement problem-solving and decision-making skills. Target Date: 2023-03-23 Frequency: Biweekly  Progress: 10 Modality: individual  Related Interventions Conduct Problem-Solving Therapy using techniques such as psychoeducation, modeling, and role-playing to teach client problem-solving skills (i.e., defining a problem specifically, generating possible solutions, evaluating the pros and cons of each solution, selecting and implementing a plan of action, evaluating the efficacy of the plan, accepting or revising the plan); role-play application of the problem-solving skill to a real life issue. Encourage in the client the development of a positive problem orientation in which problems and solving them are viewed as a natural part of life and not something to be feared, despaired, or avoided. 3. Develop healthy interpersonal relationships that lead to the alleviation and help prevent the relapse of depression. 4. Develop healthy thinking patterns and beliefs about self, others, and the world that lead to the alleviation and help prevent the relapse of depression. 5. Recognize, accept, and cope with feelings of depression. Diagnosis F33.1  Conditions For Discharge Achievement of treatment goals and objectives   [name], LCSW

## 2022-12-27 ENCOUNTER — Telehealth (INDEPENDENT_AMBULATORY_CARE_PROVIDER_SITE_OTHER): Payer: Medicare HMO | Admitting: Psychology

## 2022-12-27 DIAGNOSIS — F4320 Adjustment disorder, unspecified: Secondary | ICD-10-CM

## 2022-12-27 DIAGNOSIS — F5089 Other specified eating disorder: Secondary | ICD-10-CM

## 2022-12-27 NOTE — Progress Notes (Signed)
  Office: [phone]  /  Fax: [phone]    Date: December 27, 2022    Appointment Start Time: 8:00am Duration: 26 minutes Provider: Glennie Isle, Psy.D. Type of Session: Individual Therapy  Location of Patient: Home (private location) Location of Provider: Provider's Home (private office) Type of Contact: Telepsychological Visit via MyChart Video Visit  Session Content: Rachel Torres is a 67 y.o. female presenting for a follow-up appointment to address the previously established treatment goal of increasing coping skills.Today's appointment was a telepsychological visit. Rachel Torres provided verbal consent for today's telepsychological appointment and she is aware she is responsible for securing confidentiality on her end of the session. Prior to proceeding with today's appointment, Rachel Torres's physical location at the time of this appointment was obtained as well a phone number she could be reached at in the event of technical difficulties. Rachel Torres and this provider participated in today's telepsychological service. Of note, today's appointment was switched to a regular telephone call at 8:20am with Rachel Torres's verbal consent due to technical issues.   This provider conducted a brief check-in. Lil reported, "It has been hectic as usual." She also shared a friend passed away over the weekend. Further explored and processed. Despite the aforementioned, she indicated, "It's getting better [referring to eating habits]." Reviewed SMART goal for lunch. She feels she was "close to" her goal. Explored obstacles/barriers. Rachel Torres reported her schedule, mood, and stressors can impact her meals/appetite. This provider and Rachel Torres discussed increasing frequency of appointments with her primary therapist. Regarding her Xanax and Effexor prescriptions, she stated her medications are helpful, but noted a plan to discuss further with her PCP. Rachel Torres was encouraged to continue working toward her SMART goal for lunch.  Overall, Rachel Torres was receptive to today's appointment as evidenced by openness to sharing, responsiveness to feedback, and willingness to continue working toward established SMART goal.   Mental Status Examination:  Appearance: neat Behavior: appropriate to circumstances Mood: depressed Affect: mood congruent Speech: WNL Eye Contact: appropriate Psychomotor Activity: WNL Gait: unable to assess Thought Process: linear, logical, and goal directed and no evidence or endorsement of suicidal, homicidal, and self-harm ideation, plan and intent  Thought Content/Perception: no hallucinations, delusions, bizarre thinking or behavior endorsed or observed Orientation: AAOx4 Memory/Concentration: memory, attention, language, and fund of knowledge intact  Insight: fair Judgment: fair  Interventions:  Conducted a brief chart review Provided empathic reflections and validation Reviewed content from the previous session Employed supportive psychotherapy interventions to facilitate reduced distress and to improve coping skills with identified stressors Employed motivational interviewing skills to assess patient's willingness/desire to adhere to recommended medical treatments and assignments  DSM-5 Diagnosis(es):  F50.89 Other Specified Feeding or Eating Disorder, Emotional Eating Behaviors and F43.20 Adjustment Disorder, Unspecified   Treatment Goal & Progress: During the initial appointment with this provider, the following treatment goal was established: increase coping skills. Rachel Torres has demonstrated progress in her goal as evidenced by willingness to remind herself of all or nothing thinking, utilizing the hunger and satisfaction scale, and engaging in self-care. Rachel Torres also continues to demonstrate willingness to engage in learned skill(s).  Plan: Rachel Torres received a call during today's appointment she had to take; therefore, a follow-up appointment was not scheduled. This provider attempted to  call her back at 8:29am to schedule an appointment. A HIPAA compliant voicemail was left requesting a call back to schedule. The next session will focus on working towards the established treatment goal. Rachel Torres will continue with her primary therapist. Their next appointment is 01/04/2023.

## 2022-12-28 ENCOUNTER — Telehealth: Payer: Self-pay

## 2022-12-28 ENCOUNTER — Ambulatory Visit (INDEPENDENT_AMBULATORY_CARE_PROVIDER_SITE_OTHER): Payer: Medicare HMO

## 2022-12-28 VITALS — Wt 204.0 lb

## 2022-12-28 DIAGNOSIS — Z0000 Encounter for general adult medical examination without abnormal findings: Secondary | ICD-10-CM | POA: Diagnosis not present

## 2022-12-28 DIAGNOSIS — Z710 Person encountering health services to consult on behalf of another person: Secondary | ICD-10-CM | POA: Insufficient documentation

## 2022-12-28 DIAGNOSIS — Z659 Problem related to unspecified psychosocial circumstances: Secondary | ICD-10-CM | POA: Insufficient documentation

## 2022-12-28 NOTE — Patient Instructions (Signed)
Rachel Torres , Thank you for taking time to come for your Medicare Wellness Visit. I appreciate your ongoing commitment to your health goals. Please review the following plan we discussed and let me know if I can assist you in the future.   Screening recommendations/referrals: Colonoscopy: referral sent Mammogram: referral sent Recommended yearly ophthalmology/optometry visit for glaucoma screening and checkup Recommended yearly dental visit for hygiene and checkup  Vaccinations: Influenza vaccine: n/d Pneumococcal vaccine: 12/29/06 Tdap vaccine: 04/03/19 Shingles vaccine: n/d   Covid-19:03/12/20, 04/02/20, 10/10/20, 05/15/21  Advanced directives: no  Conditions/risks identified: none  Next appointment: Follow up in one year for your annual wellness visit 12/30/23 @ 9 am by phone   Preventive Care 65 Years and Older, Female Preventive care refers to lifestyle choices and visits with your health care provider that can promote health and wellness. What does preventive care include? A yearly physical exam. This is also called an annual well check. Dental exams once or twice a year. Routine eye exams. Ask your health care provider how often you should have your eyes checked. Personal lifestyle choices, including: Daily care of your teeth and gums. Regular physical activity. Eating a healthy diet. Avoiding tobacco and drug use. Limiting alcohol use. Practicing safe sex. Taking low-dose aspirin every day. Taking vitamin and mineral supplements as recommended by your health care provider. What happens during an annual well check? The services and screenings done by your health care provider during your annual well check will depend on your age, overall health, lifestyle risk factors, and family history of disease. Counseling  Your health care provider may ask you questions about your: Alcohol use. Tobacco use. Drug use. Emotional well-being. Home and relationship well-being. Sexual  activity. Eating habits. History of falls. Memory and ability to understand (cognition). Work and work Statistician. Reproductive health. Screening  You may have the following tests or measurements: Height, weight, and BMI. Blood pressure. Lipid and cholesterol levels. These may be checked every 5 years, or more frequently if you are over 32 years old. Skin check. Lung cancer screening. You may have this screening every year starting at age 76 if you have a 30-pack-year history of smoking and currently smoke or have quit within the past 15 years. Fecal occult blood test (FOBT) of the stool. You may have this test every year starting at age 55. Flexible sigmoidoscopy or colonoscopy. You may have a sigmoidoscopy every 5 years or a colonoscopy every 10 years starting at age 41. Hepatitis C blood test. Hepatitis B blood test. Sexually transmitted disease (STD) testing. Diabetes screening. This is done by checking your blood sugar (glucose) after you have not eaten for a while (fasting). You may have this done every 1-3 years. Bone density scan. This is done to screen for osteoporosis. You may have this done starting at age 70. Mammogram. This may be done every 1-2 years. Talk to your health care provider about how often you should have regular mammograms. Talk with your health care provider about your test results, treatment options, and if necessary, the need for more tests. Vaccines  Your health care provider may recommend certain vaccines, such as: Influenza vaccine. This is recommended every year. Tetanus, diphtheria, and acellular pertussis (Tdap, Td) vaccine. You may need a Td booster every 10 years. Zoster vaccine. You may need this after age 60. Pneumococcal 13-valent conjugate (PCV13) vaccine. One dose is recommended after age 75. Pneumococcal polysaccharide (PPSV23) vaccine. One dose is recommended after age 66. Talk to your health care  provider about which screenings and vaccines  you need and how often you need them. This information is not intended to replace advice given to you by your health care provider. Make sure you discuss any questions you have with your health care provider. Document Released: 01/02/2016 Document Revised: 08/25/2016 Document Reviewed: 10/07/2015 Elsevier Interactive Patient Education  2017 [place] Prevention in the Home Falls can cause injuries. They can happen to people of all ages. There are many things you can do to make your home safe and to help prevent falls. What can I do on the outside of my home? Regularly fix the edges of walkways and driveways and fix any cracks. Remove anything that might make you trip as you walk through a door, such as a raised step or threshold. Trim any bushes or trees on the path to your home. Use bright outdoor lighting. Clear any walking paths of anything that might make someone trip, such as rocks or tools. Regularly check to see if handrails are loose or broken. Make sure that both sides of any steps have handrails. Any raised decks and porches should have guardrails on the edges. Have any leaves, snow, or ice cleared regularly. Use sand or salt on walking paths during winter. Clean up any spills in your garage right away. This includes oil or grease spills. What can I do in the bathroom? Use night lights. Install grab bars by the toilet and in the tub and shower. Do not use towel bars as grab bars. Use non-skid mats or decals in the tub or shower. If you need to sit down in the shower, use a plastic, non-slip stool. Keep the floor dry. Clean up any water that spills on the floor as soon as it happens. Remove soap buildup in the tub or shower regularly. Attach bath mats securely with double-sided non-slip rug tape. Do not have throw rugs and other things on the floor that can make you trip. What can I do in the bedroom? Use night lights. Make sure that you have a light by your bed that  is easy to reach. Do not use any sheets or blankets that are too big for your bed. They should not hang down onto the floor. Have a firm chair that has side arms. You can use this for support while you get dressed. Do not have throw rugs and other things on the floor that can make you trip. What can I do in the kitchen? Clean up any spills right away. Avoid walking on wet floors. Keep items that you use a lot in easy-to-reach places. If you need to reach something above you, use a strong step stool that has a grab bar. Keep electrical cords out of the way. Do not use floor polish or wax that makes floors slippery. If you must use wax, use non-skid floor wax. Do not have throw rugs and other things on the floor that can make you trip. What can I do with my stairs? Do not leave any items on the stairs. Make sure that there are handrails on both sides of the stairs and use them. Fix handrails that are broken or loose. Make sure that handrails are as long as the stairways. Check any carpeting to make sure that it is firmly attached to the stairs. Fix any carpet that is loose or worn. Avoid having throw rugs at the top or bottom of the stairs. If you do have throw rugs, attach them to the  floor with carpet tape. Make sure that you have a light switch at the top of the stairs and the bottom of the stairs. If you do not have them, ask someone to add them for you. What else can I do to help prevent falls? Wear shoes that: Do not have high heels. Have rubber bottoms. Are comfortable and fit you well. Are closed at the toe. Do not wear sandals. If you use a stepladder: Make sure that it is fully opened. Do not climb a closed stepladder. Make sure that both sides of the stepladder are locked into place. Ask someone to hold it for you, if possible. Clearly mark and make sure that you can see: Any grab bars or handrails. First and last steps. Where the edge of each step is. Use tools that help you  move around (mobility aids) if they are needed. These include: Canes. Walkers. Scooters. Crutches. Turn on the lights when you go into a dark area. Replace any light bulbs as soon as they burn out. Set up your furniture so you have a clear path. Avoid moving your furniture around. If any of your floors are uneven, fix them. If there are any pets around you, be aware of where they are. Review your medicines with your doctor. Some medicines can make you feel dizzy. This can increase your chance of falling. Ask your doctor what other things that you can do to help prevent falls. This information is not intended to replace advice given to you by your health care provider. Make sure you discuss any questions you have with your health care provider. Document Released: 10/02/2009 Document Revised: 05/13/2016 Document Reviewed: 01/10/2015 Elsevier Interactive Patient Education  2017 [employer].

## 2022-12-28 NOTE — Progress Notes (Signed)
Virtual Visit via Telephone Note  I connected with  Rachel Torres on 12/28/22 at 11:00 AM EST by telephone and verified that I am speaking with the correct person using two identifiers.  Location: Patient: home Provider: [place] Persons participating in the virtual visit: [place]   I discussed the limitations, risks, security and privacy concerns of performing an evaluation and management service by telephone and the availability of in person appointments. The patient expressed understanding and agreed to proceed.  Interactive audio and video telecommunications were attempted between this nurse and patient, however failed, due to patient having technical difficulties OR patient did not have access to video capability.  We continued and completed visit with audio only.  Some vital signs may be absent or patient reported.   Dionisio David, LPN  Subjective:   Rachel Torres is a 67 y.o. female who presents for Medicare Annual (Subsequent) preventive examination.  Review of Systems     Cardiac Risk Factors include: advanced age (>69mn, >>38women);dyslipidemia;hypertension     Objective:    There were no vitals filed for this visit. There is no height or weight on file to calculate BMI.     12/28/2022   11:12 AM 10/29/2021    8:59 AM 02/24/2021    2:29 PM 06/03/2020   11:06 AM 05/14/2020    2:38 PM 05/08/2020    7:43 AM 10/04/2019    2:40 PM  Advanced Directives  Does Patient Have a Medical Advance Directive? No Yes Yes Yes Yes Yes Yes  Type of ASocial research officer, governmentLiving will Living will;Healthcare Power of ABeasleyLiving will Living will;Healthcare Power of Attorney Living will;Healthcare Power of Attorney   Does patient want to make changes to medical advance directive?  Yes (MAU/Ambulatory/Procedural Areas - Information given)   Yes (Inpatient - patient requests chaplain consult to  change a medical advance directive)  No - Patient declined  Copy of [place] Chart?      No - copy requested   Would patient like information on creating a medical advance directive? No - Patient declined          Current Medications (verified) Outpatient Encounter Medications as of 12/28/2022  Medication Sig   acetaminophen (TYLENOL) 500 MG tablet Take 500 mg by mouth every 6 (six) hours as needed.   albuterol (VENTOLIN HFA) 108 (90 Base) MCG/ACT inhaler Inhale 1-2 puffs into the lungs every 6 (six) hours as needed for wheezing or shortness of breath.   ALPRAZolam (XANAX) 0.5 MG tablet Take 1 tablet (0.5 mg total) by mouth daily as needed for anxiety.   aspirin EC 81 MG tablet Take 81 mg by mouth at bedtime.   atorvastatin (LIPITOR) 20 MG tablet Take 20 mg by mouth. Four days per week   carvedilol (COREG) 6.25 MG tablet TAKE 1 TABLET TWICE DAILY WITH MEALS   Cholecalciferol (VITAMIN D3) 50 MCG (2000 UT) CAPS Take 2,000 capsules by mouth. FOUR TIMES A WEEK   cyclobenzaprine (FLEXERIL) 10 MG tablet Take 0.5-1 tablets (5-10 mg total) by mouth 3 (three) times daily as needed. For headache   ezetimibe (ZETIA) 10 MG tablet Take 1 tablet (10 mg total) by mouth daily.   fluticasone (FLONASE) 50 MCG/ACT nasal spray Place into both nostrils.   furosemide (LASIX) 20 MG tablet TAKE 1 TABLET (20 MG TOTAL) BY MOUTH EVERY OTHER DAY. AND AS NEEDED FOR SWELLING OR SHORTNESS OF BREATH  isosorbide mononitrate (IMDUR) 30 MG 24 hr tablet Take 0.5 tablets (15 mg total) by mouth daily.   lisinopril (ZESTRIL) 5 MG tablet Take 2.5 mg by mouth daily.   magnesium gluconate (MAGONATE) 500 MG tablet Take 500 mg by mouth at bedtime.   Melatonin 5 MG SUBL Place under the tongue at bedtime as needed.   nitroGLYCERIN (NITROSTAT) 0.4 MG SL tablet Place 1 tablet (0.4 mg total) under the tongue every 5 (five) minutes as needed for chest pain.   omeprazole (PRILOSEC) 40 MG capsule TAKE 1 CAPSULE EVERY DAY    polyethylene glycol powder (GLYCOLAX/MIRALAX) 17 GM/SCOOP powder Take 17 g by mouth as needed.   potassium chloride (KLOR-CON M) 10 MEQ tablet TAKE 1 TABLET (10 MEQ TOTAL) BY MOUTH EVERY OTHER DAY. TAKE WITH LASIX   promethazine (PHENERGAN) 25 MG tablet Take 0.5 tablets (12.5 mg total) by mouth every 8 (eight) hours as needed for nausea.   Simethicone (MYLANTA GAS PO) Take by mouth 3 times/day as needed-between meals & bedtime.   TART CHERRY PO Take 1 capsule by mouth 2 (two) times daily.   venlafaxine XR (EFFEXOR-XR) 150 MG 24 hr capsule TAKE 1 CAPSULE EVERY DAY WITH BREAKFAST   venlafaxine XR (EFFEXOR-XR) 37.5 MG 24 hr capsule TAKE 1 CAPSULE (37.5 MG TOTAL) BY MOUTH DAILY WITH BREAKFAST.   clindamycin (CLEOCIN) 300 MG capsule 300 mg. AS NEEDED FOR DENTAL PROCEDURES (Patient not taking: Reported on 12/28/2022)   No facility-administered encounter medications on file as of 12/28/2022.    Allergies (verified) Fosamax [alendronate sodium], Ginzing [ginseng], Hydrocod poli-chlorphe poli er, Hydrocodone-acetaminophen, Imitrex [sumatriptan], Oxycodone, Peanut-containing drug products, Tramadol, Hydrocodone-guaifenesin, Prednisone, and Tape   History: Past Medical History:  Diagnosis Date   AAA (abdominal aortic aneurysm) ([place])    a. Chronic w/o evidence of aneurysmal dil on CTA 01/2016.   Alcohol abuse    Allergy    Anemia    Anxiety    Asthma    CAD (coronary artery disease)    a. 08/2010 s/p CABG x 1 (VG->RCA) @ time of Ao dissection repair; b. 01/2016 Lexiscan MV: mid antsept/apical defect w/ ? peri-infarct ischemia-->likely attenuation-->Med Rx.   Chewing difficulty    Chronic combined systolic (congestive) and diastolic (congestive) heart failure ([place])    a. 2012 EF 30-35%; b. 04/2015 EF 40-45%; c. 01/2016 Echo: Ef 55-65%, nl AoV bioprosthesis, nl RV, nl PASP.   Constipation    Depression    Edema of both lower extremities    Gallbladder sludge    GERD (gastroesophageal reflux disease)     Heart failure (HCC)    Heart valve problem    History of Bicuspid Aortic Valve    a. 08/2010 s/p AVR @ time of Ao dissection repair; b. 01/2016 Echo: Ef 55-65%, nl AoV bioprosthesis, nl RV, nl PASP.   Hx of repair of dissecting thoracic aortic aneurysm, Stanford type A    a. 08/2010 s/p repair with AVR and VG->RCA; b. 01/2016 CTA: stable appearance of Asc Thoracic Aortic graft. Opacification of flase lumen of chronic abd Ao dissection w/ retrograde flow through lumbar arteries. No aneurysmal dil of flase lumen.   Hyperlipidemia    Hypertension    Hypertensive heart disease    IBS (irritable bowel syndrome)    Internal hemorrhoids    Kidney problem    Marfan syndrome    pt denies   Migraine    Obstructive sleep apnea    Osteoarthritis    Osteoporosis    Palpitations  Pneumonia    RA (rheumatoid arthritis) ([place])    a. Followed by Dr. [name]   Past Surgical History:  Procedure Laterality Date   ABDOMINAL AORTIC ANEURYSM REPAIR     CARDIAC CATHETERIZATION  2001   [place]     ESOPHAGOGASTRODUODENOSCOPY (EGD) WITH PROPOFOL N/A 05/08/2020   Procedure: ESOPHAGOGASTRODUODENOSCOPY (EGD) WITH PROPOFOL;  Surgeon: Virgel Manifold, MD;  Location: [facility] ENDOSCOPY;  Service: Endoscopy;  Laterality: N/A;   FRACTURE SURGERY Right    shoulder replacement   HEMORRHOID BANDING     ORIF DISTAL RADIUS FRACTURE Left    UPPER GASTROINTESTINAL ENDOSCOPY     Family History  Problem Relation Age of Onset   Hypertension Mother    Depression Mother    Osteoporosis Mother    Stroke Mother    Irritable bowel syndrome Mother    Asthma Mother    Heart disease Mother    Thyroid disease Mother    Anxiety disorder Mother    Other Father 8   Arthritis Sister    Diabetes Sister    Heart disease Sister    Asthma Sister    Migraines Sister    Diabetes Sister    Nephrolithiasis Sister    Osteoporosis Sister    Arthritis Sister    Asthma  Sister    Migraines Sister    Diabetes Brother    Other Brother        prediabeties   Asthma Brother    Migraines Brother    Stroke Maternal Grandmother    Colon cancer Maternal Grandmother    Lymphoma Maternal Grandmother    Migraines Maternal Grandmother    Lung cancer Maternal Grandfather    Melanoma Maternal Grandfather    Aneurysm Paternal Grandmother        brain   Diabetes Paternal Grandmother    Arthritis Paternal Grandmother    Arthritis Paternal Grandfather    Diabetes Paternal Grandfather    Social History   Socioeconomic History   Marital status: Married    Spouse name: [name]   Number of children: 2   Years of education: Not on file   Highest education level: Not on file  Occupational History   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    Employer: [employer]  Tobacco Use   Smoking status: Former    Packs/day: 0.75    Years: 1.50    Total pack years: 1.13    Types: Cigarettes    Quit date: 12/20/1978    Years since quitting: 44.0   Smokeless tobacco: Never  Vaping Use   Vaping Use: Never used  Substance and Sexual Activity   Alcohol use: Yes    Comment: 2-3 glasses of red wine per month   Drug use: No   Sexual activity: Never  Other Topics Concern   Not on file  Social History Narrative    married for the second time, happily   On disability after dissection of aortic aneurysm   Husband has Parkinson's - deteriorating w/ dementia   4-6 caffeinated beverages daily   01/25/2018         Social Determinants of Health   Financial Resource Strain: Low Risk  (12/28/2022)   Overall Financial Resource Strain (CARDIA)    Difficulty of Paying Living Expenses: Not hard at all  Food Insecurity: No Food Insecurity (12/28/2022)   Hunger Vital Sign    Worried About Running Out of Food in the Last Year: Never  true    Ran Out of Food in the Last Year: Never true  Transportation Needs: No Transportation Needs (12/28/2022)   PRAPARE - Hydrologist  (Medical): No    Lack of Transportation (Non-Medical): No  Physical Activity: Inactive (12/28/2022)   Exercise Vital Sign    Days of Exercise per Week: 0 days    Minutes of Exercise per Session: 0 min  Stress: Stress Concern Present (12/28/2022)   [place]    Feeling of Stress : To some extent  Social Connections: Moderately Integrated (12/28/2022)   Social Connection and Isolation Panel [NHANES]    Frequency of Communication with Friends and Family: More than three times a week    Frequency of Social Gatherings with Friends and Family: Twice a week    Attends Religious Services: More than 4 times per year    Active Member of [employer] or Organizations: No    Attends Music therapist: Never    Marital Status: Married    Tobacco Counseling Counseling given: Not Answered   Clinical Intake:  Pre-visit preparation completed: Yes  Pain : No/denies pain     Nutritional Risks: None Diabetes: No  How often do you need to have someone help you when you read instructions, pamphlets, or other written materials from your doctor or pharmacy?: 1 - Never  Diabetic?no  Interpreter Needed?: No  Information entered by :: Kirke Shaggy, LPN   Activities of Daily Living    12/28/2022   11:13 AM  In your present state of health, do you have any difficulty performing the following activities:  Hearing? 0  Vision? 0  Difficulty concentrating or making decisions? 0  Walking or climbing stairs? 0  Dressing or bathing? 0  Doing errands, shopping? 0  Preparing Food and eating ? N  Using the Toilet? N  In the past six months, have you accidently leaked urine? N  Do you have problems with loss of bowel control? N  Managing your Medications? N  Managing your Finances? N  Housekeeping or managing your Housekeeping? N    Patient Care Team: Tower, Wynelle Fanny, MD as PCP - General (Family Medicine) [name], Kathlene November, MD  as PCP - Cardiology (Cardiology) Starlyn Skeans, MD as Consulting Physician (Family Medicine) Charlton Haws, [facility] as Pharmacist (Pharmacist)  Indicate any recent Medical Services you may have received from other than [facility] providers in the past year (date may be approximate).     Assessment:   This is a routine wellness examination for [place].  Hearing/Vision screen Hearing Screening - Comments:: No aids Vision Screening - Comments:: Wears glasses- [facility] [place] issues and exercise activities discussed: Current Exercise Habits: The patient does not participate in regular exercise at present   Goals Addressed             This Visit's Progress    DIET - EAT MORE FRUITS AND VEGETABLES         Depression Screen    12/28/2022   11:10 AM 07/13/2022   11:19 AM 10/29/2021    9:03 AM 07/17/2021    2:21 PM 05/05/2020   10:03 AM 06/05/2019    9:50 AM 12/07/2017    2:35 PM  PHQ 2/9 Scores  PHQ - 2 Score 1 6 0 0 4 2 0  PHQ- 9 Score '3 19  4 12 9 '$ 0  Exception Documentation  Medical reason      Fall Risk    12/28/2022   11:12 AM 07/13/2022   11:19 AM 10/29/2021    9:02 AM 10/04/2019    2:38 PM 06/05/2019    9:50 AM  Fall Risk   Falls in the past year? 1 1 0 1 1  Number falls in past yr: 1 0 0 1 0  Injury with Fall? 0 0 0 0 1  Risk for fall due to : History of fall(s)  No Fall Risks History of fall(s);Other (Comment)   Risk for fall due to: Comment    Trips over things   Follow up Falls prevention discussed;Falls evaluation completed Falls evaluation completed Falls prevention discussed Falls evaluation completed;Falls prevention discussed;Education provided     FALL RISK PREVENTION PERTAINING TO THE HOME:  Any stairs in or around the home? Yes  If so, are there any without handrails? No  Home free of loose throw rugs in walkways, pet beds, electrical cords, etc? Yes  Adequate lighting in your home to reduce risk of falls? Yes   ASSISTIVE DEVICES  UTILIZED TO PREVENT FALLS:  Life alert? No  Use of a cane, walker or w/c? No  Grab bars in the bathroom? Yes  Shower chair or bench in shower? Yes  Elevated toilet seat or a handicapped toilet? Yes   Cognitive Function:    06/05/2019    9:45 AM 12/07/2017    2:10 PM  MMSE - Mini Mental State Exam  Orientation to time 5 5  Orientation to Place 5 5  Registration 3 3  Attention/ Calculation 0 0  Recall 3 3  Language- name 2 objects 0 0  Language- repeat 1 1  Language- follow 3 step command 0 3  Language- read & follow direction 0 0  Write a sentence 0 0  Copy design 0 0  Total score 17 20        12/28/2022   11:13 AM  6CIT Screen  What Year? 0 points  What month? 0 points  What time? 0 points  Count back from 20 0 points  Months in reverse 0 points  Repeat phrase 0 points  Total Score 0 points    Immunizations Immunization History  Administered Date(s) Administered   H1N1 01/09/2009   Influenza Split 09/20/2011, 09/11/2012   Influenza Whole 12/29/2006, 09/19/2008, 10/08/2010   Influenza, Seasonal, Injecte, Preservative Fre 10/08/2010   Influenza,inj,Quad PF,6+ Mos 10/08/2013, 08/13/2016, 10/27/2017, 10/05/2018, 09/17/2019   Influenza-Unspecified 09/26/2015   PFIZER Comirnaty(Gray Top)Covid-19 Tri-Sucrose Vaccine 05/15/2021   PFIZER(Purple Top)SARS-COV-2 Vaccination 03/12/2020, 04/02/2020, 10/10/2020   Pneumococcal Polysaccharide-23 12/29/2006   Td 01/09/2009, 04/03/2019    TDAP status: Up to date  Flu Vaccine status: Due, Education has been provided regarding the importance of this vaccine. Advised may receive this vaccine at local pharmacy or Health Dept. Aware to provide a copy of the vaccination record if obtained from local pharmacy or Health Dept. Verbalized acceptance and understanding.  Pneumococcal vaccine status: Up to date  Covid-19 vaccine status: Completed vaccines  Qualifies for Shingles Vaccine? Yes   Zostavax completed No   Shingrix  Completed?: No.    Education has been provided regarding the importance of this vaccine. Patient has been advised to call insurance company to determine out of pocket expense if they have not yet received this vaccine. Advised may also receive vaccine at local pharmacy or Health Dept. Verbalized acceptance and understanding.  Screening Tests Health Maintenance  Topic Date Due   Zoster  Vaccines- Shingrix (1 of 2) Never done   MAMMOGRAM  08/06/2022   COVID-19 Vaccine (5 - 2023-24 season) 08/20/2022   Pneumonia Vaccine 61+ Years old (2 - PCV) 10/17/2022   INFLUENZA VACCINE  03/20/2023 (Originally 07/20/2022)   HIV Screening  01/22/2024 (Originally 10/17/1971)   COLONOSCOPY (Pts 45-36yr Insurance coverage will need to be confirmed)  01/19/2023   Medicare Annual Wellness (AWV)  12/29/2023   PAP SMEAR-Modifier  09/01/2024   DTaP/Tdap/Td (3 - Tdap) 04/02/2029   DEXA SCAN  Completed   Hepatitis C Screening  Completed   HPV VACCINES  Aged Out    Health Maintenance  Health Maintenance Due  Topic Date Due   Zoster Vaccines- Shingrix (1 of 2) Never done   MAMMOGRAM  08/06/2022   COVID-19 Vaccine (5 - 2023-24 season) 08/20/2022   Pneumonia Vaccine 66 Years old (2 - PCV) 10/17/2022    Colorectal cancer screening: Type of screening: Colonoscopy. Completed 01/19/13. Repeat every 10 years- referral sent  Mammogram status: Completed 08/06/21. Repeat every year- referral sent   Lung Cancer Screening: (Low Dose CT Chest recommended if Age 66-80years, 30 pack-year currently smoking OR have quit w/in 15years.) does not qualify.   Additional Screening:  Hepatitis C Screening: does qualify; Completed 11/24/17  Vision Screening: Recommended annual ophthalmology exams for early detection of glaucoma and other disorders of the eye. Is the patient up to date with their annual eye exam?  Yes  Who is the provider or what is the name of the office in which the patient attends annual eye exams? [facility] If  pt is not established with a provider, would they like to be referred to a provider to establish care? No .   Dental Screening: Recommended annual dental exams for proper oral hygiene  Community Resource Referral / Chronic Care Management: CRR required this visit?  No   CCM required this visit?  No      Plan:     I have personally reviewed and noted the following in the patient's chart:   Medical and social history Use of alcohol, tobacco or illicit drugs  Current medications and supplements including opioid prescriptions. Patient is not currently taking opioid prescriptions. Functional ability and status Nutritional status Physical activity Advanced directives List of other physicians Hospitalizations, surgeries, and ER visits in previous 12 months Vitals Screenings to include cognitive, depression, and falls Referrals and appointments  In addition, I have reviewed and discussed with patient certain preventive protocols, quality metrics, and best practice recommendations. A written personalized care plan for preventive services as well as general preventive health recommendations were provided to patient.     LDionisio David LPN   10/26/1095  Nurse Notes: none

## 2022-12-28 NOTE — Telephone Encounter (Signed)
Called pt to make aware of monitor results along with MD's recommendations. Pt stated during the time she was wearing monitor, she had a tooth abscess. She reported since she had tooth extraction and antibiotics she hasn't had any more episodes of palpations. Pt questioning if MD would still like to make med changes.  Pt also report she's still experiencing SOB with exertion and swelling. Pt stated for the past month, she's increased her lasix to 20 mg daily and on on two occasions took two tablets (40 mg). Pt questioning if MD feels she should increase lasix.  Will forward to MD for recommendaitons   Nelva Bush, MD 12/24/2022  8:33 AM EST     Please let Rachel Torres know that her event monitor showed multiple episodes of briefly elevated heart rates, most likely representing supraventricular tachycardia and less likely nonsustained ventricular tachycardia.  I did not observe any atrial fibrillation.  In light of these findings, I recommend that we increase carvedilol to 12.5 mg twice daily and decrease isosorbide mononitrate to 15 mg daily.  [name] should monitor her blood pressure at home and follow-up with Dr. [name] as scheduled early next month.  I will forward the results to Dr. [name] for his review and additional recommendations as appropriate.

## 2023-01-03 MED ORDER — FUROSEMIDE 20 MG PO TABS: ORAL_TABLET | ORAL | 0 refills | Status: DC

## 2023-01-03 NOTE — Telephone Encounter (Signed)
Patient has been made aware and verbalized her understanding. She will keep her appointment on 2/6.    Per Dr. [name],  She can hold off on the higher dose carvedilol if she is not having symptoms from tachycardia  Would suggest she alternate Lasix 40 with Lasix 20 every other day Recent BNP was low and did not suggest significant fluid overload but we can try slightly higher dose Lasix given normal renal function Thx TGollan

## 2023-01-04 ENCOUNTER — Encounter: Payer: Medicare HMO | Admitting: Family Medicine

## 2023-01-04 ENCOUNTER — Ambulatory Visit (INDEPENDENT_AMBULATORY_CARE_PROVIDER_SITE_OTHER): Payer: Medicare HMO | Admitting: Psychology

## 2023-01-04 DIAGNOSIS — F331 Major depressive disorder, recurrent, moderate: Secondary | ICD-10-CM | POA: Diagnosis not present

## 2023-01-04 NOTE — Progress Notes (Signed)
[place] Counselor/Therapist Progress Note  Patient ID: JAIDA BASURTO, MRN: [id number],    Date: 01/04/2023  Time Spent: 10:00am-10:55am    55 minutes   Treatment Type: Individual Therapy  Reported Symptoms: stress  Mental Status Exam: Appearance:  Casual     Behavior: Appropriate  Motor: Normal  Speech/Language:  Normal Rate  Affect: Appropriate  Mood: normal  Thought process: normal  Thought content:   WNL  Sensory/Perceptual disturbances:   WNL  Orientation: oriented to person, place, time/date, and situation  Attention: Good  Concentration: Good  Memory: WNL  Fund of knowledge:  Good  Insight:   Good  Judgment:  Good  Impulse Control: Good   Risk Assessment: Danger to Self:  No Self-injurious Behavior: No Danger to Others: No Duty to Warn:no Physical Aggression / Violence:No  Access to Firearms a concern: No  Gang Involvement:No   Subjective:  [name] present for face-to-face individual therapy via video Webex.  Pt consents to telehealth video session due to COVID 19 pandemic. Location of pt: home Location of therapist: home office.   Pt talked about her health.  She has gained most of her weight back and feels frustrated about that.  Pt continues to go to [facility].   Pt has been in more pain.  Addressed pt's health concerns.   Pt talked about caregiving for [name].  His cognitive issues are challenging to deal with.  Addressed the challenges.  Worked on problem solving and stress management.    Pt is getting more respite services from the [place] which is helpful for her.   Pt talked about a friend passing away.  Helped pt process her feelings and grief.   Worked on self care strategies.   Provided supportive therapy.  Interventions: Cognitive Behavioral Therapy and Insight-Oriented  Diagnosis: F33.1   Plan of Care: Recommend ongoing therapy.   Pt participated in setting treatment goals.   Pt needs a place to  talk about stressors.  Plan to meet every two weeks.    Treatment Plan (Treatment Plan Target Date: 03/23/2023) Client Abilities/Strengths  Pt is bright, engaging, and motivated for therapy.   Client Treatment Preferences  Individual therapy.  Client Statement of Needs  Improve coping skills.  Have a place to talk about stressors.   Symptoms  Depressed or irritable mood. Unresolved grief issues.  Problems Addressed  Unipolar Depression Goals 1. Alleviate depressive symptoms and return to previous level of effective functioning. 2. Appropriately grieve the loss in order to normalize mood and to return to previously adaptive level of functioning. Objective Learn and implement behavioral strategies to overcome depression. Target Date: 2023-03-23 Frequency: Biweekly  Progress: 10 Modality: individual  Related Interventions Engage the client in "behavioral activation," increasing his/her activity level and contact with sources of reward, while identifying processes that inhibit activation.  Use behavioral techniques such as instruction, rehearsal, role-playing, role reversal, as needed, to facilitate activity in the client's daily life; reinforce success. Assist the client in developing skills that increase the likelihood of deriving pleasure from behavioral activation (e.g., assertiveness skills, developing an exercise plan, less internal/more external focus, increased social involvement); reinforce success. Objective Identify important people in life, past and present, and describe the quality, good and poor, of those relationships. Target Date: 2023-03-23 Frequency: Biweekly  Progress: 10 Modality: individual  Related Interventions Conduct Interpersonal Therapy beginning with the assessment of the client's "interpersonal inventory" of important past and present relationships; develop a case formulation linking  depression to grief, interpersonal role disputes, role transitions, and/or  interpersonal deficits). Objective Learn and implement problem-solving and decision-making skills. Target Date: 2023-03-23 Frequency: Biweekly  Progress: 10 Modality: individual  Related Interventions Conduct Problem-Solving Therapy using techniques such as psychoeducation, modeling, and role-playing to teach client problem-solving skills (i.e., defining a problem specifically, generating possible solutions, evaluating the pros and cons of each solution, selecting and implementing a plan of action, evaluating the efficacy of the plan, accepting or revising the plan); role-play application of the problem-solving skill to a real life issue. Encourage in the client the development of a positive problem orientation in which problems and solving them are viewed as a natural part of life and not something to be feared, despaired, or avoided. 3. Develop healthy interpersonal relationships that lead to the alleviation and help prevent the relapse of depression. 4. Develop healthy thinking patterns and beliefs about self, others, and the world that lead to the alleviation and help prevent the relapse of depression. 5. Recognize, accept, and cope with feelings of depression. Diagnosis F33.1  Conditions For Discharge Achievement of treatment goals and objectives   [name], LCSW

## 2023-01-05 ENCOUNTER — Ambulatory Visit (INDEPENDENT_AMBULATORY_CARE_PROVIDER_SITE_OTHER): Payer: Medicare HMO | Admitting: Family Medicine

## 2023-01-05 ENCOUNTER — Encounter (INDEPENDENT_AMBULATORY_CARE_PROVIDER_SITE_OTHER): Payer: Self-pay | Admitting: Family Medicine

## 2023-01-05 VITALS — BP 104/60 | HR 89 | Temp 97.7°F | Ht 65.0 in | Wt 201.0 lb

## 2023-01-05 DIAGNOSIS — Z6833 Body mass index (BMI) 33.0-33.9, adult: Secondary | ICD-10-CM

## 2023-01-05 DIAGNOSIS — E669 Obesity, unspecified: Secondary | ICD-10-CM | POA: Diagnosis not present

## 2023-01-05 DIAGNOSIS — F419 Anxiety disorder, unspecified: Secondary | ICD-10-CM | POA: Diagnosis not present

## 2023-01-11 ENCOUNTER — Ambulatory Visit (INDEPENDENT_AMBULATORY_CARE_PROVIDER_SITE_OTHER): Payer: Medicare HMO | Admitting: Psychology

## 2023-01-11 DIAGNOSIS — F331 Major depressive disorder, recurrent, moderate: Secondary | ICD-10-CM | POA: Diagnosis not present

## 2023-01-11 NOTE — Progress Notes (Unsigned)
Chief Complaint:   OBESITY Rachel Torres is here to discuss her progress with her obesity treatment plan along with follow-up of her obesity related diagnoses. Rachel Torres is on {MWMwtlossportion/plan2:23431} and states she is following her eating plan approximately ***% of the time. Rachel Torres states she is *** *** minutes *** times per week.  Today's visit was #: *** Starting weight: *** Starting date: *** Today's weight: *** Today's date: 01/05/2023 Total lbs lost to date: *** Total lbs lost since last in-office visit: ***  Interim History: ***  Subjective:   1. Anxiety ***  Assessment/Plan:   1. Anxiety ***  2. Obesity, Current BMI 33.5 Rachel Torres is currently in the action stage of change. As such, her goal is to continue with weight loss efforts. She has agreed to practicing portion control and making smarter food choices, such as increasing vegetables and decreasing simple carbohydrates.   Patient wants to go back to her own Keto plan.   Behavioral modification strategies: increasing lean protein intake, emotional eating strategies, and dealing with family or coworker sabotage.  Rachel Torres has agreed to follow-up with our clinic in 6 weeks. She was informed of the importance of frequent follow-up visits to maximize her success with intensive lifestyle modifications for her multiple health conditions.   Objective:   Blood pressure 104/60, pulse 89, temperature 97.7 F (36.5 C), height '5\' 5"'$  (1.651 m), weight 201 lb (91.2 kg), SpO2 95 %. Body mass index is 33.45 kg/m.  General: Cooperative, alert, well developed, in no acute distress. HEENT: Conjunctivae and lids unremarkable. Cardiovascular: Regular rhythm.  Lungs: Normal work of breathing. Neurologic: No focal deficits.   Lab Results  Component Value Date   CREATININE 0.87 10/26/2022   BUN 16 10/26/2022   NA 139 10/26/2022   K 4.5 10/26/2022   CL 102 10/26/2022   CO2 25 10/26/2022   Lab Results  Component Value  Date   ALT 21 10/26/2022   AST 22 10/26/2022   ALKPHOS 76 10/26/2022   BILITOT 0.5 10/26/2022   Lab Results  Component Value Date   HGBA1C 5.8 (H) 10/26/2022   HGBA1C 5.5 03/17/2022   HGBA1C 5.5 12/03/2021   HGBA1C 5.6 07/10/2021   HGBA1C 5.5 02/26/2021   Lab Results  Component Value Date   INSULIN 11.7 10/26/2022   INSULIN 4.5 12/03/2021   INSULIN 17.3 02/26/2021   INSULIN 13.9 10/29/2020   INSULIN 22.5 07/23/2020   Lab Results  Component Value Date   TSH 1.280 10/26/2022   Lab Results  Component Value Date   CHOL 144 10/26/2022   HDL 79 10/26/2022   LDLCALC 53 10/26/2022   LDLDIRECT 189.4 10/13/2011   TRIG 59 10/26/2022   CHOLHDL 3 03/17/2022   Lab Results  Component Value Date   VD25OH 57.6 10/26/2022   VD25OH 57.7 12/03/2021   VD25OH 74.87 07/10/2021   Lab Results  Component Value Date   WBC 5.1 10/26/2022   HGB 13.6 10/26/2022   HCT 42.0 10/26/2022   MCV 98 (H) 10/26/2022   PLT 333 10/26/2022   Lab Results  Component Value Date   FERRITIN 105.0 12/11/2010   Attestation Statements:   Reviewed by clinician on day of visit: allergies, medications, problem list, medical history, surgical history, family history, social history, and previous encounter notes.  Time spent on visit including pre-visit chart review and post-visit care and charting was 32 minutes.   Wilhemena Durie, am acting as transcriptionist for [name], MD.  I have reviewed the above  documentation for accuracy and completeness, and I agree with the above. -  ***

## 2023-01-11 NOTE — Progress Notes (Signed)
[place] Counselor/Therapist Progress Note  Patient ID: OMAIRA MELLEN, MRN: [id number],    Date: 01/11/2023  Time Spent: 10:00am-10:55am    55 minutes   Treatment Type: Individual Therapy  Reported Symptoms: stress  Mental Status Exam: Appearance:  Casual     Behavior: Appropriate  Motor: Normal  Speech/Language:  Normal Rate  Affect: Appropriate  Mood: normal  Thought process: normal  Thought content:   WNL  Sensory/Perceptual disturbances:   WNL  Orientation: oriented to person, place, time/date, and situation  Attention: Good  Concentration: Good  Memory: WNL  Fund of knowledge:  Good  Insight:   Good  Judgment:  Good  Impulse Control: Good   Risk Assessment: Danger to Self:  No Self-injurious Behavior: No Danger to Others: No Duty to Warn:no Physical Aggression / Violence:No  Access to Firearms a concern: No  Gang Involvement:No   Subjective:  [name] present for face-to-face individual therapy via video Webex.  Pt consents to telehealth video session due to COVID 19 pandemic. Location of pt: home Location of therapist: home office.   Pt talked about work she needs to have done on the house.  Pt has the funds now to do some needed renovations.  Pt is not anxious about the projects yet but is concerned she may be once the project work starts.  Addressed how pt can manage the stress of renovations.   Pt talked about caregiving for [name].  His cognitive issues are challenging to deal with.  Addressed the challenges.  Worked on problem solving and stress management.    Pt  talked about her health.   She has gained weight and is having some pain issues.   Pt sees her PCP this week and is going to see if she can get a physical therapy referral.   Worked on self care strategies.   Provided supportive therapy.  Interventions: Cognitive Behavioral Therapy and Insight-Oriented  Diagnosis: F33.1   Plan of Care: Recommend ongoing therapy.   Pt  participated in setting treatment goals.   Pt needs a place to talk about stressors.  Plan to meet every two weeks.    Treatment Plan (Treatment Plan Target Date: 03/23/2023) Client Abilities/Strengths  Pt is bright, engaging, and motivated for therapy.   Client Treatment Preferences  Individual therapy.  Client Statement of Needs  Improve coping skills.  Have a place to talk about stressors.   Symptoms  Depressed or irritable mood. Unresolved grief issues.  Problems Addressed  Unipolar Depression Goals 1. Alleviate depressive symptoms and return to previous level of effective functioning. 2. Appropriately grieve the loss in order to normalize mood and to return to previously adaptive level of functioning. Objective Learn and implement behavioral strategies to overcome depression. Target Date: 2023-03-23 Frequency: Biweekly  Progress: 10 Modality: individual  Related Interventions Engage the client in "behavioral activation," increasing his/her activity level and contact with sources of reward, while identifying processes that inhibit activation.  Use behavioral techniques such as instruction, rehearsal, role-playing, role reversal, as needed, to facilitate activity in the client's daily life; reinforce success. Assist the client in developing skills that increase the likelihood of deriving pleasure from behavioral activation (e.g., assertiveness skills, developing an exercise plan, less internal/more external focus, increased social involvement); reinforce success. Objective Identify important people in life, past and present, and describe the quality, good and poor, of those relationships. Target Date: 2023-03-23 Frequency: Biweekly  Progress: 10 Modality: individual  Related Interventions Conduct Interpersonal Therapy beginning with the  assessment of the client's "interpersonal inventory" of important past and present relationships; develop a case formulation linking depression to  grief, interpersonal role disputes, role transitions, and/or interpersonal deficits). Objective Learn and implement problem-solving and decision-making skills. Target Date: 2023-03-23 Frequency: Biweekly  Progress: 10 Modality: individual  Related Interventions Conduct Problem-Solving Therapy using techniques such as psychoeducation, modeling, and role-playing to teach client problem-solving skills (i.e., defining a problem specifically, generating possible solutions, evaluating the pros and cons of each solution, selecting and implementing a plan of action, evaluating the efficacy of the plan, accepting or revising the plan); role-play application of the problem-solving skill to a real life issue. Encourage in the client the development of a positive problem orientation in which problems and solving them are viewed as a natural part of life and not something to be feared, despaired, or avoided. 3. Develop healthy interpersonal relationships that lead to the alleviation and help prevent the relapse of depression. 4. Develop healthy thinking patterns and beliefs about self, others, and the world that lead to the alleviation and help prevent the relapse of depression. 5. Recognize, accept, and cope with feelings of depression. Diagnosis F33.1  Conditions For Discharge Achievement of treatment goals and objectives   [name], LCSW

## 2023-01-14 ENCOUNTER — Encounter: Payer: Self-pay | Admitting: Family Medicine

## 2023-01-14 ENCOUNTER — Ambulatory Visit (INDEPENDENT_AMBULATORY_CARE_PROVIDER_SITE_OTHER): Payer: Medicare HMO | Admitting: Family Medicine

## 2023-01-14 ENCOUNTER — Encounter: Payer: Self-pay | Admitting: *Deleted

## 2023-01-14 VITALS — BP 130/70 | HR 92 | Temp 97.8°F | Ht 65.0 in | Wt 208.2 lb

## 2023-01-14 DIAGNOSIS — M810 Age-related osteoporosis without current pathological fracture: Secondary | ICD-10-CM

## 2023-01-14 DIAGNOSIS — Z0000 Encounter for general adult medical examination without abnormal findings: Secondary | ICD-10-CM

## 2023-01-14 DIAGNOSIS — Z23 Encounter for immunization: Secondary | ICD-10-CM

## 2023-01-14 DIAGNOSIS — Z8679 Personal history of other diseases of the circulatory system: Secondary | ICD-10-CM | POA: Diagnosis not present

## 2023-01-14 DIAGNOSIS — I10 Essential (primary) hypertension: Secondary | ICD-10-CM

## 2023-01-14 DIAGNOSIS — Z6835 Body mass index (BMI) 35.0-35.9, adult: Secondary | ICD-10-CM

## 2023-01-14 DIAGNOSIS — H539 Unspecified visual disturbance: Secondary | ICD-10-CM

## 2023-01-14 DIAGNOSIS — I714 Abdominal aortic aneurysm, without rupture, unspecified: Secondary | ICD-10-CM

## 2023-01-14 DIAGNOSIS — E7849 Other hyperlipidemia: Secondary | ICD-10-CM | POA: Diagnosis not present

## 2023-01-14 DIAGNOSIS — H903 Sensorineural hearing loss, bilateral: Secondary | ICD-10-CM

## 2023-01-14 DIAGNOSIS — E538 Deficiency of other specified B group vitamins: Secondary | ICD-10-CM | POA: Diagnosis not present

## 2023-01-14 DIAGNOSIS — I480 Paroxysmal atrial fibrillation: Secondary | ICD-10-CM

## 2023-01-14 DIAGNOSIS — K219 Gastro-esophageal reflux disease without esophagitis: Secondary | ICD-10-CM | POA: Diagnosis not present

## 2023-01-14 DIAGNOSIS — Z1231 Encounter for screening mammogram for malignant neoplasm of breast: Secondary | ICD-10-CM

## 2023-01-14 DIAGNOSIS — Z9181 History of falling: Secondary | ICD-10-CM

## 2023-01-14 DIAGNOSIS — F418 Other specified anxiety disorders: Secondary | ICD-10-CM

## 2023-01-14 DIAGNOSIS — E559 Vitamin D deficiency, unspecified: Secondary | ICD-10-CM

## 2023-01-14 DIAGNOSIS — I5042 Chronic combined systolic (congestive) and diastolic (congestive) heart failure: Secondary | ICD-10-CM

## 2023-01-14 DIAGNOSIS — H9319 Tinnitus, unspecified ear: Secondary | ICD-10-CM

## 2023-01-14 DIAGNOSIS — Z1211 Encounter for screening for malignant neoplasm of colon: Secondary | ICD-10-CM

## 2023-01-14 DIAGNOSIS — R7303 Prediabetes: Secondary | ICD-10-CM

## 2023-01-14 NOTE — Patient Instructions (Addendum)
Call and change your eye doctor appt   Get your 2nd shingrix vaccine at [facility]   Call [facility] to schedule your mammogram    Pneumonia vaccine today - prevnar 20     Call [facility] to schedule your colonoscopy   [facility]  [phone]  I put in a referral to audiology   Please let us know if you don't hear in 1-2 weeks     Reach out to Dr Lorelei Pont about PT for your shoulder   Let us know when you may want to get PT for balance

## 2023-01-14 NOTE — Progress Notes (Unsigned)
Subjective:    Patient ID: Rachel Torres, female    DOB: 1956/07/01, 67 y.o.   MRN: [id number]  HPI Here for health maintenance exam and to review chronic medical problems    Wt Readings from Last 3 Encounters:  01/14/23 208 lb 4 oz (94.5 kg)  01/05/23 201 lb (91.2 kg)  12/28/22 204 lb (92.5 kg)   34.65 kg/m  Now husb has [employer] disability  Financial strain is improved  Running the household   Now has full time caregiving Getting caught up    Not a lot of time for self care   L shoulder pain -had shot   R knee swells=had shot   Tinnitus - constant roaring Makes it harder to sleep   Had a root canal On abx   Stress causes cortisol weight gain  Seeing [name] for counseling   L eye is twitchy  Sometimes dark spot in vision  Eye appt next mo-has to be out of change and change her appt   Going to be a grandmother in April   Plans to get set up with personal trainer   Immunization History  Administered Date(s) Administered   H1N1 01/09/2009   Influenza Split 09/20/2011, 09/11/2012   Influenza Whole 12/29/2006, 09/19/2008, 10/08/2010   Influenza, Seasonal, Injecte, Preservative Fre 10/08/2010   Influenza,inj,Quad PF,6+ Mos 10/08/2013, 08/13/2016, 10/27/2017, 10/05/2018, 09/17/2019   Influenza-Unspecified 09/26/2015   PFIZER Comirnaty(Gray Top)Covid-19 Tri-Sucrose Vaccine 05/15/2021   PFIZER(Purple Top)SARS-COV-2 Vaccination 03/12/2020, 04/02/2020, 10/10/2020   Pneumococcal Polysaccharide-23 12/29/2006   Td 01/09/2009, 04/03/2019   Health Maintenance Due  Topic Date Due   Zoster Vaccines- Shingrix (1 of 2) Never done   MAMMOGRAM  08/06/2022   Pneumonia Vaccine 34+ Years old (2 - PCV) 10/17/2022   Shingrix: had the first one /needs 2nd   [place]ogram 07/2021  [facility]  Self breast exam : no lumps   Pneumonia vaccine: -today   Colonoscopy 12/2012  Due for this   Pap 08/2021  Nl with neg HPV   Dexa  07/2021 OP forearm Did not tolerate alendronate  in the past  Falls- has had 2 falls - uneven deck /slippery  Fractures: none  Supplements : vit D Level is therapeutic  Exercise : wants to start with a trainer  Is struggling with shoulder pain   Mood: fair  Lots of stressors      01/14/2023    3:17 PM 12/28/2022   11:10 AM 07/13/2022   11:19 AM 10/29/2021    9:03 AM 07/17/2021    2:21 PM  Depression screen PHQ 2/9  Decreased Interest 1 0 3 0 0  Down, Depressed, Hopeless '1 1 3 '$ 0 0  PHQ - 2 Score '2 1 6 '$ 0 0  Altered sleeping '3 1 2  2  '$ Tired, decreased energy 0 '1 2  2  '$ Change in appetite 1 0 3  0  Feeling bad or failure about yourself  0 0 2  0  Trouble concentrating 1 0 2  0  Moving slowly or fidgety/restless 1 0 2  0  Suicidal thoughts 0 0 0  0  PHQ-9 Score '8 3 19  4  '$ Difficult doing work/chores Somewhat difficult Not difficult at all Somewhat difficult  Somewhat difficult       Vitals:   01/14/23 1459  BP: 130/70  Pulse: 92  Temp: 97.8 F (36.6 C)  SpO2: 96%   HTN bp is stable today  No cp or palpitations or headaches or edema  No side effects to medicines  BP Readings from Last 3 Encounters:  01/14/23 130/70  01/05/23 104/60  12/09/22 116/80    Lisinopril 2.5 mg every other Lasix 20 mg every other  Coreg 6.25 mg bid  Lab Results  Component Value Date   CREATININE 0.87 10/26/2022   BUN 16 10/26/2022   NA 139 10/26/2022   K 4.5 10/26/2022   CL 102 10/26/2022   CO2 25 10/26/2022     Cardiology care  A fib   GERD Omeprazole 40 mg bid  B12 def Lab Results  Component Value Date   VITAMINB12 574 10/26/2022    Hyperlipidemia  Lab Results  Component Value Date   CHOL 144 10/26/2022   CHOL 182 03/17/2022   CHOL 195 12/03/2021   Lab Results  Component Value Date   HDL 79 10/26/2022   HDL 63.30 03/17/2022   HDL 67 12/03/2021   Lab Results  Component Value Date   LDLCALC 53 10/26/2022   LDLCALC 105 (H) 03/17/2022   LDLCALC 112 (H) 12/03/2021   Lab Results  Component Value Date    TRIG 59 10/26/2022   TRIG 70.0 03/17/2022   TRIG 91 12/03/2021   Lab Results  Component Value Date   CHOLHDL 3 03/17/2022   CHOLHDL 3 07/10/2021   CHOLHDL 3.4 02/26/2021   Lab Results  Component Value Date   LDLDIRECT 189.4 10/13/2011   LDLDIRECT 163.3 05/05/2009   LDLDIRECT 146.4 01/09/2009    Prediabetes Lab Results  Component Value Date   HGBA1C 5.8 (H) 10/26/2022       Review of Systems  Constitutional:  Negative for activity change, appetite change, fatigue, fever and unexpected weight change.  HENT:  Positive for dental problem and tinnitus. Negative for congestion, ear pain, rhinorrhea, sinus pressure and sore throat.   Eyes:  Negative for pain, redness and visual disturbance.       Eye twitch  Respiratory:  Negative for cough, shortness of breath and wheezing.   Cardiovascular:  Negative for chest pain and palpitations.  Gastrointestinal:  Negative for abdominal pain, blood in stool, constipation and diarrhea.  Endocrine: Negative for polydipsia and polyuria.  Genitourinary:  Negative for dysuria, frequency and urgency.  Musculoskeletal:  Positive for arthralgias. Negative for back pain and myalgias.  Skin:  Negative for pallor and rash.  Allergic/Immunologic: Negative for environmental allergies.  Neurological:  Negative for dizziness, syncope and headaches.  Hematological:  Negative for adenopathy. Does not bruise/bleed easily.  Psychiatric/Behavioral:  Positive for dysphoric mood. Negative for decreased concentration. The patient is nervous/anxious.        Caregiving stress       Objective:   Physical Exam Constitutional:      General: She is not in acute distress.    Appearance: Normal appearance. She is well-developed. She is obese. She is not ill-appearing or diaphoretic.  HENT:     Head: Normocephalic and atraumatic.     Right Ear: Tympanic membrane, ear canal and external ear normal.     Left Ear: Tympanic membrane, ear canal and external ear  normal.     Nose: Nose normal. No congestion.     Mouth/Throat:     Mouth: Mucous membranes are moist.     Pharynx: Oropharynx is clear. No posterior oropharyngeal erythema.  Eyes:     General: No scleral icterus.    Extraocular Movements: Extraocular movements intact.     Conjunctiva/sclera: Conjunctivae normal.     Pupils: Pupils are equal, round, and reactive  to light.  Neck:     Thyroid: No thyromegaly.     Vascular: No carotid bruit or JVD.  Cardiovascular:     Rate and Rhythm: Normal rate and regular rhythm.     Pulses: Normal pulses.     Heart sounds: Normal heart sounds.     No gallop.  Pulmonary:     Effort: Pulmonary effort is normal. No respiratory distress.     Breath sounds: Normal breath sounds. No wheezing.     Comments: Good air exch Chest:     Chest wall: No tenderness.  Abdominal:     General: Bowel sounds are normal. There is no distension or abdominal bruit.     Palpations: Abdomen is soft. There is no mass.     Tenderness: There is no abdominal tenderness.     Hernia: No hernia is present.  Genitourinary:    Comments: Breast exam: No mass, nodules, thickening, tenderness, bulging, retraction, inflamation, nipple discharge or skin changes noted.  No axillary or clavicular LA.     Musculoskeletal:        General: No tenderness. Normal range of motion.     Cervical back: Normal range of motion and neck supple. No rigidity. No muscular tenderness.     Right lower leg: No edema.     Left lower leg: No edema.     Comments: No kyphosis   Lymphadenopathy:     Cervical: No cervical adenopathy.  Skin:    General: Skin is warm and dry.     Coloration: Skin is not pale.     Findings: No erythema or rash.     Comments: Solar lentigines diffusely   Neurological:     Mental Status: She is alert. Mental status is at baseline.     Cranial Nerves: No cranial nerve deficit.     Motor: No abnormal muscle tone.     Coordination: Coordination normal.     Gait: Gait  normal.     Deep Tendon Reflexes: Reflexes are normal and symmetric. Reflexes normal.  Psychiatric:        Mood and Affect: Mood normal.        Cognition and Memory: Cognition and memory normal.           Assessment & Plan:   Problem List Items Addressed This Visit       Cardiovascular and Mediastinum   AAA (abdominal aortic aneurysm) ([place])    H/o dissection  No re occurrence  Under card/vasc care  Clinically stable       Chronic combined systolic (congestive) and diastolic (congestive) heart failure (HCC)    Stable Under care of cardiology Continues lininopril, lasix and coreg      Essential hypertension - Primary    BP: 130/70  Under cardiology care Controlled  Lisinopril 2.5 mg four days weekly Lasix 20 mg four days  Coreg 6.25 mg bid         Digestive   GERD     Nervous and Auditory   Sensorineural hearing loss   Relevant Orders   Ambulatory referral to Audiology     Musculoskeletal and Integument   Osteoporosis     Other   B12 deficiency   Change in vision   Class 2 severe obesity with serious comorbidity and body mass index (BMI) of 35.0 to 35.9 in adult [facility])    Discussed how this problem influences overall health and the risks it imposes  Reviewed plan for weight loss with lower calorie  diet (via better food choices and also portion control or program like weight watchers) and exercise building up to or more than 30 minutes 5 days per week including some aerobic activity  Continues in wt loss clinic  Less motivation with severe stressors       Colon cancer screening    Due for colonoscopy/screening  Referral done Pt will call to schedule      Relevant Orders   Ambulatory referral to Gastroenterology   Depression with anxiety    Continues effexor xr 150 pluse 37.5 mg daily  No longer sees psychiatrist Stressors are high/caregiving   Reviewed stressors/ coping techniques/symptoms/ support sources/ tx options and side effects in detail  today       History of atrial fibrillation    Not recent  Taking coreg Card care       Hyperlipidemia    Disc goals for lipids and reasons to control them Rev last labs with pt Rev low sat fat diet in detail Atorvastatin 20 four times weekly   LDL of 53       Personal history of fall    High fall risk   May be open to PT later on , not yet  Disc fall prevention       Prediabetes    Lab Results  Component Value Date   HGBA1C 5.8 (H) 10/26/2022  disc imp of low glycemic diet and wt loss to prevent DM2       Routine general medical examination at a health care facility    Reviewed health habits including diet and exercise and skin cancer prevention Reviewed appropriate screening tests for age  Also reviewed health mt list, fam hx and immunization status , as well as social and family history   See HPI Labs reviewed Enc f/u with eye doctor  Ref done for mammogram-she will call to schedule Ref done for colonoscoppy-pt will call to schedule  Pap utd  Dexa utd , disc fall prevention and exercise plan  Planning 2nd shingrix vaccine at the pharmacy Audiology ref done       Screening mammogram, encounter for    Mammogram ordered She will call [facility] to schedule      Relevant Orders   MM 3D SCREEN BREAST BILATERAL   Tinnitus    Referral to audiology  Has hearing loss      Relevant Orders   Ambulatory referral to Audiology   Vitamin D deficiency    Vitamin D level is therapeutic with current supplementation Disc importance of this to bone and overall health Tx in 23s      Other Visit Diagnoses     Need for pneumococcal vaccination       Relevant Orders   Pneumococcal conjugate vaccine 20-valent (Prevnar 20) (Completed)

## 2023-01-16 ENCOUNTER — Other Ambulatory Visit: Payer: Self-pay | Admitting: Family Medicine

## 2023-01-16 NOTE — Assessment & Plan Note (Signed)
Due for colonoscopy/screening  Referral done Pt will call to schedule

## 2023-01-16 NOTE — Assessment & Plan Note (Signed)
Discussed how this problem influences overall health and the risks it imposes  Reviewed plan for weight loss with lower calorie diet (via better food choices and also portion control or program like weight watchers) and exercise building up to or more than 30 minutes 5 days per week including some aerobic activity  Continues in wt loss clinic  Less motivation with severe stressors

## 2023-01-16 NOTE — Assessment & Plan Note (Signed)
Lab Results  Component Value Date   HGBA1C 5.8 (H) 10/26/2022   disc imp of low glycemic diet and wt loss to prevent DM2

## 2023-01-16 NOTE — Assessment & Plan Note (Signed)
Referral to audiology  Has hearing loss

## 2023-01-16 NOTE — Assessment & Plan Note (Signed)
Vitamin D level is therapeutic with current supplementation Disc importance of this to bone and overall health Tx in 4s

## 2023-01-16 NOTE — Assessment & Plan Note (Addendum)
BP: 130/70  Under cardiology care Controlled  Lisinopril 2.5 mg four days weekly Lasix 20 mg four days  Coreg 6.25 mg bid

## 2023-01-16 NOTE — Assessment & Plan Note (Signed)
Stable Under care of cardiology Continues lininopril, lasix and coreg

## 2023-01-16 NOTE — Assessment & Plan Note (Signed)
Disc goals for lipids and reasons to control them Rev last labs with pt Rev low sat fat diet in detail Atorvastatin 20 four times weekly   LDL of 53

## 2023-01-16 NOTE — Assessment & Plan Note (Signed)
High fall risk   May be open to PT later on , not yet  Disc fall prevention

## 2023-01-16 NOTE — Assessment & Plan Note (Signed)
Continues effexor xr 150 pluse 37.5 mg daily  No longer sees psychiatrist Stressors are high/caregiving   Reviewed stressors/ coping techniques/symptoms/ support sources/ tx options and side effects in detail today

## 2023-01-16 NOTE — Assessment & Plan Note (Signed)
Not recent  Taking coreg Card care

## 2023-01-16 NOTE — Assessment & Plan Note (Signed)
Mammogram ordered She will call [facility] to schedule

## 2023-01-16 NOTE — Assessment & Plan Note (Signed)
H/o dissection  No re occurrence  Under card/vasc care  Clinically stable

## 2023-01-16 NOTE — Assessment & Plan Note (Addendum)
Reviewed health habits including diet and exercise and skin cancer prevention Reviewed appropriate screening tests for age  Also reviewed health mt list, fam hx and immunization status , as well as social and family history   See HPI Labs reviewed Enc f/u with eye doctor  Ref done for mammogram-she will call to schedule Ref done for colonoscoppy-pt will call to schedule  Pap utd  Dexa utd , disc fall prevention and exercise plan  Planning 2nd shingrix vaccine at the pharmacy Audiology ref done  Prevnar 20 vaccine given

## 2023-01-17 MED ORDER — ALPRAZOLAM 0.5 MG PO TABS: 0.5000 mg | ORAL_TABLET | Freq: Every day | ORAL | 0 refills | Status: DC | PRN

## 2023-01-17 NOTE — Telephone Encounter (Signed)
Name of Medication: Xanax Name of Pharmacy: [facility] Last Fill or Written Date and Quantity: 03/17/22 #30 tabs/ 0 refill  Last Office Visit and Type: CPE 01/14/23 Next Office Visit and Type: none scheduled

## 2023-01-18 ENCOUNTER — Ambulatory Visit (INDEPENDENT_AMBULATORY_CARE_PROVIDER_SITE_OTHER): Payer: Medicare HMO | Admitting: Psychology

## 2023-01-18 DIAGNOSIS — F331 Major depressive disorder, recurrent, moderate: Secondary | ICD-10-CM

## 2023-01-18 NOTE — Progress Notes (Signed)
[facility] Counselor/Therapist Progress Note  Patient ID: Rachel Torres, MRN: [id number],    Date: 01/18/2023  Time Spent: 10:00am-10:50am    50 minutes   Treatment Type: Individual Therapy  Reported Symptoms: stress  Mental Status Exam: Appearance:  Casual     Behavior: Appropriate  Motor: Normal  Speech/Language:  Normal Rate  Affect: Appropriate  Mood: normal  Thought process: normal  Thought content:   WNL  Sensory/Perceptual disturbances:   WNL  Orientation: oriented to person, place, time/date, and situation  Attention: Good  Concentration: Good  Memory: WNL  Fund of knowledge:  Good  Insight:   Good  Judgment:  Good  Impulse Control: Good   Risk Assessment: Danger to Self:  No Self-injurious Behavior: No Danger to Others: No Duty to Warn:no Physical Aggression / Violence:No  Access to Firearms a concern: No  Gang Involvement:No   Subjective:  [name] present for face-to-face individual therapy via video Webex.  Pt consents to telehealth video session due to COVID 19 pandemic. Location of pt: home Location of therapist: home office.   Pt talked about Saturday being the one year anniversary of her niece's death.   Helped pt process her feelings and grief.   Pt talked about caregiving for [name].  He fell this morning on a bookshelf.  He hurt his back.  His cognitive issues are challenging to deal with.  Addressed the challenges.  Worked on problem solving and stress management.    Pt  talked about her health.   Worked on self care strategies.   Provided supportive therapy.  Interventions: Cognitive Behavioral Therapy and Insight-Oriented  Diagnosis: F33.1   Plan of Care: Recommend ongoing therapy.   Pt participated in setting treatment goals.   Pt needs a place to talk about stressors.  Plan to meet every two weeks.    Treatment Plan (Treatment Plan Target Date: 03/23/2023) Client Abilities/Strengths  Pt is bright, engaging, and  motivated for therapy.   Client Treatment Preferences  Individual therapy.  Client Statement of Needs  Improve coping skills.  Have a place to talk about stressors.   Symptoms  Depressed or irritable mood. Unresolved grief issues.  Problems Addressed  Unipolar Depression Goals 1. Alleviate depressive symptoms and return to previous level of effective functioning. 2. Appropriately grieve the loss in order to normalize mood and to return to previously adaptive level of functioning. Objective Learn and implement behavioral strategies to overcome depression. Target Date: 2023-03-23 Frequency: Biweekly  Progress: 10 Modality: individual  Related Interventions Engage the client in "behavioral activation," increasing his/her activity level and contact with sources of reward, while identifying processes that inhibit activation.  Use behavioral techniques such as instruction, rehearsal, role-playing, role reversal, as needed, to facilitate activity in the client's daily life; reinforce success. Assist the client in developing skills that increase the likelihood of deriving pleasure from behavioral activation (e.g., assertiveness skills, developing an exercise plan, less internal/more external focus, increased social involvement); reinforce success. Objective Identify important people in life, past and present, and describe the quality, good and poor, of those relationships. Target Date: 2023-03-23 Frequency: Biweekly  Progress: 10 Modality: individual  Related Interventions Conduct Interpersonal Therapy beginning with the assessment of the client's "interpersonal inventory" of important past and present relationships; develop a case formulation linking depression to grief, interpersonal role disputes, role transitions, and/or interpersonal deficits). Objective Learn and implement problem-solving and decision-making skills. Target Date: 2023-03-23 Frequency: Biweekly  Progress: 10 Modality:  individual  Related Interventions Conduct  Problem-Solving Therapy using techniques such as psychoeducation, modeling, and role-playing to teach client problem-solving skills (i.e., defining a problem specifically, generating possible solutions, evaluating the pros and cons of each solution, selecting and implementing a plan of action, evaluating the efficacy of the plan, accepting or revising the plan); role-play application of the problem-solving skill to a real life issue. Encourage in the client the development of a positive problem orientation in which problems and solving them are viewed as a natural part of life and not something to be feared, despaired, or avoided. 3. Develop healthy interpersonal relationships that lead to the alleviation and help prevent the relapse of depression. 4. Develop healthy thinking patterns and beliefs about self, others, and the world that lead to the alleviation and help prevent the relapse of depression. 5. Recognize, accept, and cope with feelings of depression. Diagnosis F33.1  Conditions For Discharge Achievement of treatment goals and objectives   [name], LCSW

## 2023-01-24 ENCOUNTER — Ambulatory Visit (INDEPENDENT_AMBULATORY_CARE_PROVIDER_SITE_OTHER): Payer: Medicare HMO | Admitting: Psychology

## 2023-01-24 ENCOUNTER — Encounter: Payer: Self-pay | Admitting: Family Medicine

## 2023-01-24 DIAGNOSIS — G8929 Other chronic pain: Secondary | ICD-10-CM

## 2023-01-24 DIAGNOSIS — F331 Major depressive disorder, recurrent, moderate: Secondary | ICD-10-CM | POA: Diagnosis not present

## 2023-01-24 DIAGNOSIS — M19012 Primary osteoarthritis, left shoulder: Secondary | ICD-10-CM

## 2023-01-24 NOTE — Progress Notes (Signed)
Cardiology Office Note  Date:  01/25/2023   ID:  Rachel Torres, DOB 04/21/1956, MRN [id number]  PCP:  Rachel Pimple, MD   Chief Complaint  Patient presents with   3 month follow up     Patient c/o shortness of breath with occasional palpitations. Medications reviewed by the patient verbally.     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  rushed to [facility] and underwent aortic valve replacement,  bypass of the right coronary artery with venous donor site from her right thigh,  aortic grafting for a extensive dissection of the  ascending aorta,  residual descending aortic dissection extending into the iliac artery,  She reports pathology of her aortic valve showed bicuspid valve EF 30 to 35,  up to 45 to 50% in August 2020, unchanged October 2023 chronic back pain, shortness of breath,  abdominal pain,  Chest pain episodes resolved by GI cocktail Essential hypertension OSA not on CPAP She presents today for follow-up of her aortic valve replacement and CABG.  LOV 9/23 In follow-up today, concerned about her continued weight gain No regular exercise program, stress eating Previously did not tolerate Ozempic, reports she has chronic GI issues  SOB on exertion which she attributes to higher weight and deconditioning with no exercise  Receiving support from the [place] Husband with extreme medical issues Having renovations done on their house to accommodate his debility  Reports her watch sometimes reads low oxygen level diagnosed with obstructive sleep apnea. She has a CPAP machine but she is not using it.    Labs reviewed A1C 5.8 Total chol 144,LDL 53 BNP 16  Prior workup for chest discomfort Stress test 10/23 Pharmacological myocardial perfusion imaging study with no significant  ischemia Normal wall motion, EF estimated at 45% (significant GI uptake artifact noted) No EKG changes concerning for  ischemia at peak stress or in recovery. Low risk scan   Echocardiogram with EF 45 to 50%, stable valve  Last CT scan 8/22, followed by vascular  EKG personally reviewed by myself on todays visit Normal sinus rhythm rate 74 bpm no significant ST-T wave changes  Other past medical history reviewed covid 11/2019 Got it from husband ,he spent time in rehab  Previous  30 day monitor ordered for palpitations,  APCs and PVCs  Previous evaluations in the emergency room for chest pain. Cardiac enzymes were negative , EKG unchanged .She had a CT scan dated 06/20/2013. She was transferred to [facility] given her complicated anatomy. images were evaluated by vascular surgery it was determined there have been no significant progression of her disease   Previous evaluation at [facility] for shortness of breath and chest discomfort. She had workup for cardiac issues including echocardiogram and PET stress, CTA of the chest showing stable descending aorta repair with type B. aortic dissection with opacification of the false lumen at the level of the renal arteries via a lumbar artery.   Echocardiogram showed ejection fraction had improved to 45-50%, mild MR, TR (ejection fraction improved from 30-35% in January 2012)   Previous H/o abdominal pain, further workup showing biliary stones and sludge. s/p  laparoscopic cholecystectomy at [facility].    PMH:   has a past medical history of AAA (abdominal aortic aneurysm) (HCC), Alcohol abuse, Allergy, Anemia, Anxiety, Asthma, CAD (coronary artery disease), Chewing difficulty, Chronic combined systolic (congestive) and diastolic (congestive) heart failure (HCC), Constipation, Depression, Edema of both lower  extremities, Gallbladder sludge, GERD (gastroesophageal reflux disease), Heart failure (HCC), Heart valve problem, History of Bicuspid Aortic Valve, repair of dissecting thoracic aortic aneurysm, Stanford type A, Hyperlipidemia, Hypertension, Hypertensive heart disease, IBS  (irritable bowel syndrome), Internal hemorrhoids, Kidney problem, Marfan syndrome, Migraine, Obstructive sleep apnea, Osteoarthritis, Osteoporosis, Palpitations, Pneumonia, and RA (rheumatoid arthritis) (HCC).  PSH:    Past Surgical History:  Procedure Laterality Date   ABDOMINAL AORTIC ANEURYSM REPAIR     CARDIAC CATHETERIZATION  2001   CESAREAN SECTION  1986 & 1991   CHOLECYSTECTOMY     COLONOSCOPY     ESOPHAGOGASTRODUODENOSCOPY (EGD) WITH PROPOFOL N/A 05/08/2020   Procedure: ESOPHAGOGASTRODUODENOSCOPY (EGD) WITH PROPOFOL;  Surgeon: Rachel Spillers, MD;  Location: [facility] ENDOSCOPY;  Service: Endoscopy;  Laterality: N/A;   FRACTURE SURGERY Right    shoulder replacement   HEMORRHOID BANDING     ORIF DISTAL RADIUS FRACTURE Left    UPPER GASTROINTESTINAL ENDOSCOPY      Current Outpatient Medications  Medication Sig Dispense Refill   acetaminophen (TYLENOL) 500 MG tablet Take 500 mg by mouth every 6 (six) hours as needed.     albuterol (VENTOLIN HFA) 108 (90 Base) MCG/ACT inhaler Inhale 1-2 puffs into the lungs every 6 (six) hours as needed for wheezing or shortness of breath. 3 each 3   ALPRAZolam (XANAX) 0.5 MG tablet Take 1 tablet (0.5 mg total) by mouth daily as needed for anxiety. 30 tablet 0   aspirin EC 81 MG tablet Take 81 mg by mouth at bedtime.     atorvastatin (LIPITOR) 20 MG tablet Take 20 mg by mouth. Four days per week     carvedilol (COREG) 6.25 MG tablet TAKE 1 TABLET TWICE DAILY WITH MEALS 180 tablet 3   Cholecalciferol (VITAMIN D3) 50 MCG (2000 UT) CAPS Take 2,000 capsules by mouth. FOUR TIMES A WEEK     cyclobenzaprine (FLEXERIL) 10 MG tablet Take 0.5-1 tablets (5-10 mg total) by mouth 3 (three) times daily as needed. For headache 90 tablet 3   ezetimibe (ZETIA) 10 MG tablet Take 1 tablet (10 mg total) by mouth daily. 90 tablet 3   fluticasone (FLONASE) 50 MCG/ACT nasal spray Place into both nostrils.     furosemide (LASIX) 20 MG tablet Take 40 mg (2 tablets) one day  and the next day 20 mg, alternating daily 48 tablet 0   hydrOXYzine (ATARAX) 25 MG tablet Take 25 mg by mouth 3 (three) times daily.     isosorbide mononitrate (IMDUR) 30 MG 24 hr tablet Take 0.5 tablets (15 mg total) by mouth daily. 45 tablet 3   lisinopril (ZESTRIL) 5 MG tablet Take 2.5 mg by mouth daily.     magnesium gluconate (MAGONATE) 500 MG tablet Take 500 mg by mouth at bedtime.     Melatonin 5 MG SUBL Place under the tongue at bedtime as needed.     nitroGLYCERIN (NITROSTAT) 0.4 MG SL tablet Place 1 tablet (0.4 mg total) under the tongue every 5 (five) minutes as needed for chest pain. 25 tablet 3   omeprazole (PRILOSEC) 40 MG capsule TAKE 1 CAPSULE EVERY DAY 90 capsule 1   polyethylene glycol powder (GLYCOLAX/MIRALAX) 17 GM/SCOOP powder Take 17 g by mouth as needed. 255 g 0   potassium chloride (KLOR-CON M) 10 MEQ tablet Take 10 mEq by mouth 2 (two) times daily.     promethazine (PHENERGAN) 25 MG tablet Take 0.5 tablets (12.5 mg total) by mouth every 8 (eight) hours as needed for nausea. 90 tablet  3   Simethicone (MYLANTA GAS PO) Take by mouth 3 times/day as needed-between meals & bedtime.     TART CHERRY PO Take 1 capsule by mouth 2 (two) times daily.     venlafaxine XR (EFFEXOR-XR) 150 MG 24 hr capsule TAKE 1 CAPSULE EVERY DAY WITH BREAKFAST 7 capsule 0   venlafaxine XR (EFFEXOR-XR) 37.5 MG 24 hr capsule TAKE 1 CAPSULE (37.5 MG TOTAL) BY MOUTH DAILY WITH BREAKFAST. 90 capsule 2   clindamycin (CLEOCIN) 300 MG capsule Take 300 mg by mouth 3 (three) times daily. (Patient not taking: Reported on 01/25/2023)     No current facility-administered medications for this visit.    Allergies:   Fosamax [alendronate sodium], Ginzing [ginseng], Hydrocod poli-chlorphe poli er, Hydrocodone-acetaminophen, Imitrex [sumatriptan], Oxycodone, Peanut-containing drug products, Tramadol, Hydrocodone-guaifenesin, Prednisone, and Tape   Social History:  The patient  reports that she quit smoking about 44  years ago. Her smoking use included cigarettes. She has a 1.13 pack-year smoking history. She has never used smokeless tobacco. She reports current alcohol use. She reports that she does not use drugs.   Family History:   family history includes Aneurysm in her paternal grandmother; Anxiety disorder in her mother; Arthritis in her paternal grandfather, paternal grandmother, sister, and sister; Asthma in her brother, mother, sister, and sister; Colon cancer in her maternal grandmother; Depression in her mother; Diabetes in her brother, paternal grandfather, paternal grandmother, sister, and sister; Heart disease in her mother and sister; Hypertension in her mother; Irritable bowel syndrome in her mother; Lung cancer in her maternal grandfather; Lymphoma in her maternal grandmother; Melanoma in her maternal grandfather; Migraines in her brother, maternal grandmother, sister, and sister; Nephrolithiasis in her sister; Osteoporosis in her mother and sister; Other in her brother; Other (age of onset: 64) in her father; Stroke in her maternal grandmother and mother; Thyroid disease in her mother.    Review of Systems: Review of Systems  Constitutional: Negative.   HENT: Negative.    Respiratory:  Positive for shortness of breath.   Cardiovascular: Negative.   Gastrointestinal: Negative.   Musculoskeletal: Negative.   Neurological: Negative.   Psychiatric/Behavioral: Negative.    All other systems reviewed and are negative.   PHYSICAL EXAM: VS:  BP 110/70 (BP Location: Left Arm, Patient Position: Sitting, Cuff Size: Normal)   Pulse 74   Ht 5\' 5"  (1.651 m)   Wt 208 lb 8 oz (94.6 kg)   SpO2 98%   BMI 34.70 kg/m  , BMI Body mass index is 34.7 kg/m. Constitutional:  oriented to person, place, and time. No distress.  HENT:  Head: Grossly normal Eyes:  no discharge. No scleral icterus.  Neck: No JVD, no carotid bruits  Cardiovascular: Regular rate and rhythm, no murmurs  appreciated Pulmonary/Chest: Clear to auscultation bilaterally, no wheezes or rails Abdominal: Soft.  no distension.  no tenderness.  Musculoskeletal: Normal range of motion Neurological:  normal muscle tone. Coordination normal. No atrophy Skin: Skin warm and dry Psychiatric: normal affect, pleasant  Recent Labs: 10/26/2022: ALT 21; BNP 16.0; BUN 16; Creatinine, Ser 0.87; Hemoglobin 13.6; Platelets 333; Potassium 4.5; Sodium 139; TSH 1.280    Lipid Panel Lab Results  Component Value Date   CHOL 144 10/26/2022   HDL 79 10/26/2022   LDLCALC 53 10/26/2022   TRIG 59 10/26/2022     Wt Readings from Last 3 Encounters:  01/25/23 208 lb 8 oz (94.6 kg)  01/14/23 208 lb 4 oz (94.5 kg)  01/05/23 201 lb (91.2  kg)     ASSESSMENT AND PLAN:  Pure hypercholesterolemia Cholesterol is at goal on the current lipid regimen. No changes to the medications were made.  Essential hypertension -  Blood pressure is well controlled on today's visit. No changes made to the medications.  CORONARY ARTERY BYPASS GRAFT Chronic stable angina Stable symptoms Suggested walking  Dissection of thoracoabdominal aorta (HCC) Followed by vascular surgery in [place], Dr. Anner Crete on recent eval , CT 07/2021,  Repeat scan 07/2023  H/O dissecting abdominal aortic aneurysm repair - MRI 2020  Followed by vascular in [place]  Chronic diastolic CHF euvolemic echo EF 45% Stable, BP low, no additional medications  Abdominal pain Symptoms improved  Morbid obesity Previously tried Ozempic but had side effects Dietary discretion discussed Recommend regular walking program  Adjustment disorder Significant stress at home, husband and mother Receiving some financial support from the [place] which is helping   Total encounter time more than 30 minutes  Greater than 50% was spent in counseling and coordination of care with the patient    No orders of the defined types were placed in this  encounter.    Signed, Dossie Arbour, M.D., Ph.D. 01/25/2023  [facility] [place], [place] [phone]

## 2023-01-24 NOTE — Progress Notes (Signed)
[place] Counselor/Therapist Progress Note  Patient ID: Rachel Torres, MRN: [id number],    Date: 01/24/2023  Time Spent: 4:00pm-4:50pm     50 minutes   Treatment Type: Individual Therapy  Reported Symptoms: stress  Mental Status Exam: Appearance:  Casual     Behavior: Appropriate  Motor: Normal  Speech/Language:  Normal Rate  Affect: Appropriate  Mood: normal  Thought process: normal  Thought content:   WNL  Sensory/Perceptual disturbances:   WNL  Orientation: oriented to person, place, time/date, and situation  Attention: Good  Concentration: Good  Memory: WNL  Fund of knowledge:  Good  Insight:   Good  Judgment:  Good  Impulse Control: Good   Risk Assessment: Danger to Self:  No Self-injurious Behavior: No Danger to Others: No Duty to Warn:no Physical Aggression / Violence:No  Access to Firearms a concern: No  Gang Involvement:No   Subjective:  [name] present for face-to-face individual therapy via video Webex.  Pt consents to telehealth video session due to COVID 19 pandemic. Location of pt: home Location of therapist: home office.   Pt talked about having a stressful couple of days.  Her caregiver for Rachel Torres called out.   The construction at the house has started as well.  This has created some chaos at the house.  Worked on Child psychotherapist.  Pt talked about caregiving for Rachel Torres.   His cognitive issues are challenging to deal with.  Addressed the challenges.  Worked on problem solving.  Pt talked about her health.  She sees the cardiologist tomorrow.  Addressed pt's concerns.   Worked on self care strategies.   Provided supportive therapy.  Interventions: Cognitive Behavioral Therapy and Insight-Oriented  Diagnosis: F33.1   Plan of Care: Recommend ongoing therapy.   Pt participated in setting treatment goals.   Pt needs a place to talk about stressors.  Plan to meet every two weeks.    Treatment Plan (Treatment Plan Target Date:  03/23/2023) Client Abilities/Strengths  Pt is bright, engaging, and motivated for therapy.   Client Treatment Preferences  Individual therapy.  Client Statement of Needs  Improve coping skills.  Have a place to talk about stressors.   Symptoms  Depressed or irritable mood. Unresolved grief issues.  Problems Addressed  Unipolar Depression Goals 1. Alleviate depressive symptoms and return to previous level of effective functioning. 2. Appropriately grieve the loss in order to normalize mood and to return to previously adaptive level of functioning. Objective Learn and implement behavioral strategies to overcome depression. Target Date: 2023-03-23 Frequency: Biweekly  Progress: 10 Modality: individual  Related Interventions Engage the client in "behavioral activation," increasing his/her activity level and contact with sources of reward, while identifying processes that inhibit activation.  Use behavioral techniques such as instruction, rehearsal, role-playing, role reversal, as needed, to facilitate activity in the client's daily life; reinforce success. Assist the client in developing skills that increase the likelihood of deriving pleasure from behavioral activation (e.g., assertiveness skills, developing an exercise plan, less internal/more external focus, increased social involvement); reinforce success. Objective Identify important people in life, past and present, and describe the quality, good and poor, of those relationships. Target Date: 2023-03-23 Frequency: Biweekly  Progress: 10 Modality: individual  Related Interventions Conduct Interpersonal Therapy beginning with the assessment of the client's "interpersonal inventory" of important past and present relationships; develop a case formulation linking depression to grief, interpersonal role disputes, role transitions, and/or interpersonal deficits). Objective Learn and implement problem-solving and decision-making skills. Target  Date: 2023-03-23  Frequency: Biweekly  Progress: 10 Modality: individual  Related Interventions Conduct Problem-Solving Therapy using techniques such as psychoeducation, modeling, and role-playing to teach client problem-solving skills (i.e., defining a problem specifically, generating possible solutions, evaluating the pros and cons of each solution, selecting and implementing a plan of action, evaluating the efficacy of the plan, accepting or revising the plan); role-play application of the problem-solving skill to a real life issue. Encourage in the client the development of a positive problem orientation in which problems and solving them are viewed as a natural part of life and not something to be feared, despaired, or avoided. 3. Develop healthy interpersonal relationships that lead to the alleviation and help prevent the relapse of depression. 4. Develop healthy thinking patterns and beliefs about self, others, and the world that lead to the alleviation and help prevent the relapse of depression. 5. Recognize, accept, and cope with feelings of depression. Diagnosis F33.1  Conditions For Discharge Achievement of treatment goals and objectives   [name], LCSW

## 2023-01-25 ENCOUNTER — Ambulatory Visit: Payer: Medicare HMO | Attending: Cardiovascular Disease | Admitting: Cardiovascular Disease

## 2023-01-25 ENCOUNTER — Encounter: Payer: Self-pay | Admitting: Cardiovascular Disease

## 2023-01-25 VITALS — BP 110/70 | HR 74 | Ht 65.0 in | Wt 208.5 lb

## 2023-01-25 DIAGNOSIS — E7849 Other hyperlipidemia: Secondary | ICD-10-CM

## 2023-01-25 DIAGNOSIS — Z951 Presence of aortocoronary bypass graft: Secondary | ICD-10-CM

## 2023-01-25 DIAGNOSIS — I25118 Atherosclerotic heart disease of native coronary artery with other forms of angina pectoris: Secondary | ICD-10-CM

## 2023-01-25 DIAGNOSIS — I10 Essential (primary) hypertension: Secondary | ICD-10-CM | POA: Diagnosis not present

## 2023-01-25 DIAGNOSIS — R002 Palpitations: Secondary | ICD-10-CM | POA: Diagnosis not present

## 2023-01-25 DIAGNOSIS — I480 Paroxysmal atrial fibrillation: Secondary | ICD-10-CM

## 2023-01-25 DIAGNOSIS — R000 Tachycardia, unspecified: Secondary | ICD-10-CM | POA: Diagnosis not present

## 2023-01-25 DIAGNOSIS — Z952 Presence of prosthetic heart valve: Secondary | ICD-10-CM

## 2023-01-25 DIAGNOSIS — I7101 Dissection of ascending aorta: Secondary | ICD-10-CM

## 2023-01-25 MED ORDER — FUROSEMIDE 20 MG PO TABS: ORAL_TABLET | ORAL | 2 refills | Status: DC

## 2023-01-25 NOTE — Patient Instructions (Signed)
Medication Instructions:  No changes  If you need a refill on your cardiac medications before your next appointment, please call your pharmacy.   Lab work: No new labs needed  Testing/Procedures: No new testing needed  Follow-Up: At [facility], you and your health needs are our priority.  As part of our continuing mission to provide you with exceptional heart care, we have created designated Provider Care Teams.  These Care Teams include your primary Cardiologist (physician) and Advanced Practice Providers (APPs -  Physician Assistants and Nurse Practitioners) who all work together to provide you with the care you need, when you need it.  You will need a follow up appointment in 12 months  Providers on your designated Care Team:   Christopher Berge, NP Ryan Dunn, PA-C Cadence Furth, PA-C  COVID-19 Vaccine Information can be found at: [email or web address] For questions related to vaccine distribution or appointments, please email [email or web address] or call [phone].   

## 2023-01-27 ENCOUNTER — Ambulatory Visit (INDEPENDENT_AMBULATORY_CARE_PROVIDER_SITE_OTHER): Payer: Medicare HMO | Admitting: Dermatology

## 2023-01-27 ENCOUNTER — Ambulatory Visit: Payer: Medicare HMO | Attending: Family Medicine

## 2023-01-27 VITALS — BP 122/69 | HR 80

## 2023-01-27 DIAGNOSIS — M5459 Other low back pain: Secondary | ICD-10-CM | POA: Insufficient documentation

## 2023-01-27 DIAGNOSIS — L814 Other melanin hyperpigmentation: Secondary | ICD-10-CM

## 2023-01-27 DIAGNOSIS — L578 Other skin changes due to chronic exposure to nonionizing radiation: Secondary | ICD-10-CM

## 2023-01-27 DIAGNOSIS — M542 Cervicalgia: Secondary | ICD-10-CM | POA: Diagnosis not present

## 2023-01-27 DIAGNOSIS — G8929 Other chronic pain: Secondary | ICD-10-CM | POA: Diagnosis not present

## 2023-01-27 DIAGNOSIS — L219 Seborrheic dermatitis, unspecified: Secondary | ICD-10-CM

## 2023-01-27 DIAGNOSIS — L304 Erythema intertrigo: Secondary | ICD-10-CM

## 2023-01-27 DIAGNOSIS — M25512 Pain in left shoulder: Secondary | ICD-10-CM | POA: Insufficient documentation

## 2023-01-27 DIAGNOSIS — M25619 Stiffness of unspecified shoulder, not elsewhere classified: Secondary | ICD-10-CM | POA: Insufficient documentation

## 2023-01-27 DIAGNOSIS — Z1283 Encounter for screening for malignant neoplasm of skin: Secondary | ICD-10-CM | POA: Diagnosis not present

## 2023-01-27 DIAGNOSIS — L821 Other seborrheic keratosis: Secondary | ICD-10-CM | POA: Diagnosis not present

## 2023-01-27 DIAGNOSIS — D229 Melanocytic nevi, unspecified: Secondary | ICD-10-CM | POA: Diagnosis not present

## 2023-01-27 DIAGNOSIS — M19012 Primary osteoarthritis, left shoulder: Secondary | ICD-10-CM | POA: Diagnosis not present

## 2023-01-27 DIAGNOSIS — L570 Actinic keratosis: Secondary | ICD-10-CM

## 2023-01-27 DIAGNOSIS — Z79899 Other long term (current) drug therapy: Secondary | ICD-10-CM

## 2023-01-27 DIAGNOSIS — M6281 Muscle weakness (generalized): Secondary | ICD-10-CM | POA: Diagnosis not present

## 2023-01-27 DIAGNOSIS — L820 Inflamed seborrheic keratosis: Secondary | ICD-10-CM | POA: Diagnosis not present

## 2023-01-27 MED ORDER — KETOCONAZOLE 2 % EX SHAM: MEDICATED_SHAMPOO | CUTANEOUS | 3 refills | Status: DC

## 2023-01-27 MED ORDER — HYDROCORTISONE 2.5 % EX CREA: TOPICAL_CREAM | CUTANEOUS | 3 refills | Status: DC

## 2023-01-27 MED ORDER — MOMETASONE FUROATE 0.1 % EX SOLN: CUTANEOUS | 2 refills | Status: DC

## 2023-01-27 MED ORDER — KETOCONAZOLE 2 % EX CREA: TOPICAL_CREAM | CUTANEOUS | 3 refills | Status: DC

## 2023-01-27 NOTE — Patient Instructions (Addendum)
Cryotherapy Aftercare  Wash gently with soap and water everyday.   Apply Vaseline and Band-Aid daily until healed.     Due to recent changes in healthcare laws, you may see results of your pathology and/or laboratory studies on MyChart before the doctors have had a chance to review them. We understand that in some cases there may be results that are confusing or concerning to you. Please understand that not all results are received at the same time and often the doctors may need to interpret multiple results in order to provide you with the best plan of care or course of treatment. Therefore, we ask that you please give us 2 business days to thoroughly review all your results before contacting the office for clarification. Should we see a critical lab result, you will be contacted sooner.   If You Need Anything After Your Visit  If you have any questions or concerns for your doctor, please call our main line at [phone] and press option 4 to reach your doctor's medical assistant. If no one answers, please leave a voicemail as directed and we will return your call as soon as possible. Messages left after 4 pm will be answered the following business day.   You may also send us a message via MyChart. We typically respond to MyChart messages within 1-2 business days.  For prescription refills, please ask your pharmacy to contact our office. Our fax number is [phone].  If you have an urgent issue when the clinic is closed that cannot wait until the next business day, you can page your doctor at the number below.    Please note that while we do our best to be available for urgent issues outside of office hours, we are not available 24/7.   If you have an urgent issue and are unable to reach us, you may choose to seek medical care at your doctor's office, retail clinic, urgent care center, or emergency room.  If you have a medical emergency, please immediately call 911 or go to the  emergency department.  Pager Numbers  - Dr. Kowalski: [phone]  - Dr. Moye: [phone]  - Dr. Stewart: [phone]  In the event of inclement weather, please call our main line at [phone] for an update on the status of any delays or closures.  Dermatology Medication Tips: Please keep the boxes that topical medications come in in order to help keep track of the instructions about where and how to use these. Pharmacies typically print the medication instructions only on the boxes and not directly on the medication tubes.   If your medication is too expensive, please contact our office at [phone] option 4 or send us a message through MyChart.   We are unable to tell what your co-pay for medications will be in advance as this is different depending on your insurance coverage. However, we may be able to find a substitute medication at lower cost or fill out paperwork to get insurance to cover a needed medication.   If a prior authorization is required to get your medication covered by your insurance company, please allow us 1-2 business days to complete this process.  Drug prices often vary depending on where the prescription is filled and some pharmacies may offer cheaper prices.  The website www.goodrx.com contains coupons for medications through different pharmacies. The prices here do not account for what the cost may be with help from insurance (it may be cheaper with your insurance), but the website can   give you the price if you did not use any insurance.  - You can print the associated coupon and take it with your prescription to the pharmacy.  - You may also stop by our office during regular business hours and pick up a GoodRx coupon card.  - If you need your prescription sent electronically to a different pharmacy, notify our office through [facility] MyChart or by phone at [phone] option 4.     Si Usted Necesita Algo Despus de Su Visita  Tambin puede  enviarnos un mensaje a travs de MyChart. Por lo general respondemos a los mensajes de MyChart en el transcurso de 1 a 2 das hbiles.  Para renovar recetas, por favor pida a su farmacia que se ponga en contacto con nuestra oficina. Nuestro nmero de fax es el [phone].  Si tiene un asunto urgente cuando la clnica est cerrada y que no puede esperar hasta el siguiente da hbil, puede llamar/localizar a su doctor(a) al nmero que aparece a continuacin.   Por favor, tenga en cuenta que aunque hacemos todo lo posible para estar disponibles para asuntos urgentes fuera del horario de oficina, no estamos disponibles las 24 horas del da, los 7 das de la semana.   Si tiene un problema urgente y no puede comunicarse con nosotros, puede optar por buscar atencin mdica  en el consultorio de su doctor(a), en una clnica privada, en un centro de atencin urgente o en una sala de emergencias.  Si tiene una emergencia mdica, por favor llame inmediatamente al 911 o vaya a la sala de emergencias.  Nmeros de [facility]per  - Dr. Kowalski: [phone]  - Dra. Moye: [phone]  - Dra. Stewart: [phone]  En caso de inclemencias del tiempo, por favor llame a nuestra lnea principal al [phone] para una actualizacin sobre el estado de cualquier retraso o cierre.  Consejos para la medicacin en dermatologa: Por favor, guarde las cajas en las que vienen los medicamentos de uso tpico para ayudarle a seguir las instrucciones sobre dnde y cmo usarlos. Las farmacias generalmente imprimen las instrucciones del medicamento slo en las cajas y no directamente en los tubos del medicamento.   Si su medicamento es muy caro, por favor, pngase en contacto con nuestra oficina llamando al [phone] y presione la opcin 4 o envenos un mensaje a travs de MyChart.   No podemos decirle cul ser su copago por los medicamentos por adelantado ya que esto es diferente dependiendo de la cobertura de su seguro.  Sin embargo, es posible que podamos encontrar un medicamento sustituto a menor costo o llenar un formulario para que el seguro cubra el medicamento que se considera necesario.   Si se requiere una autorizacin previa para que su compaa de seguros cubra su medicamento, por favor permtanos de 1 a 2 das hbiles para completar este proceso.  Los precios de los medicamentos varan con frecuencia dependiendo del lugar de dnde se surte la receta y alguna farmacias pueden ofrecer precios ms baratos.  El sitio web www.goodrx.com tiene cupones para medicamentos de diferentes farmacias. Los precios aqu no tienen en cuenta lo que podra costar con la ayuda del seguro (puede ser ms barato con su seguro), pero el sitio web puede darle el precio si no utiliz ningn seguro.  - Puede imprimir el cupn correspondiente y llevarlo con su receta a la farmacia.  - Tambin puede pasar por nuestra oficina durante el horario de atencin regular y recoger una tarjeta de cupones de GoodRx.  -   Si necesita que su receta se enve electrnicamente a una farmacia diferente, informe a nuestra oficina a travs de MyChart de [facility] o por telfono llamando al [phone] y presione la opcin 4.  

## 2023-01-27 NOTE — Progress Notes (Signed)
New Patient Visit  Subjective  Rachel Torres is a 67 y.o. female who presents for the following: Skin Problem. Patient c/o rash on her abdomen fold area that will come and go, using otc Monistat cream with a fair response, rash clear today  The patient presents for Total-Body Skin Exam (TBSE) for skin cancer screening and mole check.  The patient has spots, moles and lesions to be evaluated, some may be new or changing and the patient has concerns that these could be cancer.   The following portions of the chart were reviewed this encounter and updated as appropriate:   Tobacco  Allergies  Meds  Problems  Med Hx  Surg Hx  Fam Hx     Review of Systems:  No other skin or systemic complaints except as noted in HPI or Assessment and Plan.  Objective  Well appearing patient in no apparent distress; mood and affect are within normal limits.  A full examination was performed including scalp, head, eyes, ears, nose, lips, neck, chest, axillae, abdomen, back, buttocks, bilateral upper extremities, bilateral lower extremities, hands, feet, fingers, toes, fingernails, and toenails. All findings within normal limits unless otherwise noted below.  abdomen fold Clear skin today   Scalp Pink patches with greasy scale.   Nose Discoloration with a erythematous thin papules/macules with gritty scale.      left forehead x 1 Stuck-on, waxy, tan-brown papule -- Discussed benign etiology and prognosis.    Assessment & Plan  Intertrigo abdomen fold  Intertrigo is a chronic recurrent rash that occurs in skin fold areas that may be associated with friction; heat; moisture; yeast; fungus; and bacteria.  It is exacerbated by increased movement / activity; sweating; and higher atmospheric temperature.   Start Ketoconazole cream daily 7 days a week prn rash  Start Hydrocortisone cream apply to rash 5 days a week prn flares   Related Medications ketoconazole (NIZORAL) 2 % cream Apply to  rash on the abdomen area 7 days a week prn  hydrocortisone 2.5 % cream Apply to rash on the abdomen 5 nights a week prn irritation  Seborrheic dermatitis Scalp  Seborrheic Dermatitis  -  is a chronic persistent rash characterized by pinkness and scaling most commonly of the mid face but also can occur on the scalp (dandruff), ears; mid chest, mid back and groin.  It tends to be exacerbated by stress and cooler weather.  People who have neurologic disease may experience new onset or exacerbation of existing seborrheic dermatitis.  The condition is not curable but treatable and can be controlled.   Start Ketoconazole shampoo apply three times per week, massage into scalp and leave in for 10 minutes before rinsing out  Start Mometasone solution apply to itchy scalp 5 days a week prn   Related Medications ketoconazole (NIZORAL) 2 % shampoo apply three times per week, massage into scalp and leave in for 10 minutes before rinsing out  mometasone (ELOCON) 0.1 % lotion Apply to scalp 5 nights week prn itch  AK (actinic keratosis) Nose  Actinic keratoses are precancerous spots that appear secondary to cumulative UV radiation exposure/sun exposure over time. They are chronic with expected duration over 1 year. A portion of actinic keratoses will progress to squamous cell carcinoma of the skin. It is not possible to reliably predict which spots will progress to skin cancer and so treatment is recommended to prevent development of skin cancer.  Recommend daily broad spectrum sunscreen SPF 30+ to sun-exposed areas, reapply every  2 hours as needed.  Recommend staying in the shade or wearing long sleeves, sun glasses (UVA+UVB protection) and wide brim hats (4-inch brim around the entire circumference of the hat). Call for new or changing lesions.   Destruction of lesion - Nose Complexity: simple   Destruction method: cryotherapy   Informed consent: discussed and consent obtained   Timeout:   patient name, date of birth, surgical site, and procedure verified Lesion destroyed using liquid nitrogen: Yes   Region frozen until ice ball extended beyond lesion: Yes   Outcome: patient tolerated procedure well with no complications   Post-procedure details: wound care instructions given    Inflamed seborrheic keratosis left forehead x 1  Symptomatic, irritating, patient would like treated.   Destruction of lesion - left forehead x 1 Complexity: simple   Destruction method: cryotherapy   Informed consent: discussed and consent obtained   Timeout:  patient name, date of birth, surgical site, and procedure verified Lesion destroyed using liquid nitrogen: Yes   Region frozen until ice ball extended beyond lesion: Yes   Outcome: patient tolerated procedure well with no complications   Post-procedure details: wound care instructions given    Lentigines - Scattered tan macules - Due to sun exposure - Benign-appearing, observe - Recommend daily broad spectrum sunscreen SPF 30+ to sun-exposed areas, reapply every 2 hours as needed. - Call for any changes  Seborrheic Keratoses - Stuck-on, waxy, tan-brown papules and/or plaques  - Benign-appearing - Discussed benign etiology and prognosis. - Observe - Call for any changes  Melanocytic Nevi - Tan-brown and/or pink-flesh-colored symmetric macules and papules - Benign appearing on exam today - Observation - Call clinic for new or changing moles - Recommend daily use of broad spectrum spf 30+ sunscreen to sun-exposed areas.   Hemangiomas - Red papules - Discussed benign nature - Observe - Call for any changes  Actinic Damage - Chronic condition, secondary to cumulative UV/sun exposure - diffuse scaly erythematous macules with underlying dyspigmentation - Recommend daily broad spectrum sunscreen SPF 30+ to sun-exposed areas, reapply every 2 hours as needed.  - Staying in the shade or wearing long sleeves, sun glasses (UVA+UVB  protection) and wide brim hats (4-inch brim around the entire circumference of the hat) are also recommended for sun protection.  - Call for new or changing lesions.  Skin cancer screening performed today.   Return in about 1 year (around 01/28/2024) for TBSE, hx of AKs, ISK.  I, Marye Round, CMA, am acting as scribe for Sarina Ser, MD .  Documentation: I have reviewed the above documentation for accuracy and completeness, and I agree with the above.  Sarina Ser, MD

## 2023-01-27 NOTE — Therapy (Signed)
OUTPATIENT PHYSICAL THERAPY SHOULDER EVALUATION   Patient Name: Rachel Torres MRN: [id number] DOB:[birth year], 67 y.o., female Today's Date: 01/28/2023  END OF SESSION:  PT End of Session - 01/27/23 1353     Visit Number 1    Number of Visits 24    Date for PT Re-Evaluation 04/21/23    Progress Note Due on Visit 10    PT Start Time 1345    PT Stop Time 1432    PT Time Calculation (min) 47 min    Activity Tolerance Patient tolerated treatment well;Patient limited by pain    Behavior During Therapy [facility] for tasks assessed/performed             Past Medical History:  Diagnosis Date   AAA (abdominal aortic aneurysm) ([place])    a. Chronic w/o evidence of aneurysmal dil on CTA 01/2016.   Alcohol abuse    Allergy    Anemia    Anxiety    Asthma    CAD (coronary artery disease)    a. 08/2010 s/p CABG x 1 (VG->RCA) @ time of Ao dissection repair; b. 01/2016 Lexiscan MV: mid antsept/apical defect w/ ? peri-infarct ischemia-->likely attenuation-->Med Rx.   Chewing difficulty    Chronic combined systolic (congestive) and diastolic (congestive) heart failure ([place])    a. 2012 EF 30-35%; b. 04/2015 EF 40-45%; c. 01/2016 Echo: Ef 55-65%, nl AoV bioprosthesis, nl RV, nl PASP.   Constipation    Depression    Edema of both lower extremities    Gallbladder sludge    GERD (gastroesophageal reflux disease)    Heart failure (HCC)    Heart valve problem    History of Bicuspid Aortic Valve    a. 08/2010 s/p AVR @ time of Ao dissection repair; b. 01/2016 Echo: Ef 55-65%, nl AoV bioprosthesis, nl RV, nl PASP.   Hx of repair of dissecting thoracic aortic aneurysm, Stanford type A    a. 08/2010 s/p repair with AVR and VG->RCA; b. 01/2016 CTA: stable appearance of Asc Thoracic Aortic graft. Opacification of flase lumen of chronic abd Ao dissection w/ retrograde flow through lumbar arteries. No aneurysmal dil of flase lumen.   Hyperlipidemia    Hypertension    Hypertensive heart disease    IBS  (irritable bowel syndrome)    Internal hemorrhoids    Kidney problem    Marfan syndrome    pt denies   Migraine    Obstructive sleep apnea    Osteoarthritis    Osteoporosis    Palpitations    Pneumonia    RA (rheumatoid arthritis) ([place])    a. Followed by Dr. [name]   Past Surgical History:  Procedure Laterality Date   ABDOMINAL AORTIC ANEURYSM REPAIR     CARDIAC CATHETERIZATION  2001   [place]     ESOPHAGOGASTRODUODENOSCOPY (EGD) WITH PROPOFOL N/A 05/08/2020   Procedure: ESOPHAGOGASTRODUODENOSCOPY (EGD) WITH PROPOFOL;  Surgeon: Virgel Manifold, MD;  Location: [facility] ENDOSCOPY;  Service: Endoscopy;  Laterality: N/A;   FRACTURE SURGERY Right    shoulder replacement   HEMORRHOID BANDING     ORIF DISTAL RADIUS FRACTURE Left    UPPER GASTROINTESTINAL ENDOSCOPY     Patient Active Problem List   Diagnosis Date Noted   Tinnitus 01/14/2023   Change in vision 01/14/2023   Colon cancer screening 01/14/2023   Personal history of fall 01/14/2023   Problem related to unspecified psychosocial circumstances 12/28/2022   Person  encountering health services to consult on behalf of another person 12/28/2022   Palpitation 12/10/2022   Fatigue 10/26/2022   B12 deficiency 10/26/2022   Right shoulder pain 09/15/2022   Depression 09/14/2022   Class 2 severe obesity with serious comorbidity and body mass index (BMI) of 35.0 to 35.9 in adult [facility]) 09/14/2022   Other constipation 05/12/2022   Medicare annual wellness visit, initial 07/17/2021   TMJ (dislocation of temporomandibular joint) 07/17/2021   Encounter for screening mammogram for breast cancer 06/05/2021   Diarrhea 04/11/2020   Epigastric pain 02/19/2020   Dark stools 02/19/2020   History of atrial fibrillation 02/05/2019   Shortness of breath 01/27/2019   Irregular surface of cornea 12/25/2018   HPV (human papilloma virus) infection 12/01/2016   Screening mammogram,  encounter for 11/29/2016   Estrogen deficiency 11/29/2016   Encounter for routine gynecological examination 11/29/2016   Grade III hemorrhoids 10/06/2016   Tachycardia 08/14/2016   Hemorrhoids 08/13/2016   Atypical migraine 08/03/2016   CAD (coronary artery disease)    AAA (abdominal aortic aneurysm) (HCC)    Chronic combined systolic (congestive) and diastolic (congestive) heart failure ([place])    Prediabetes 02/11/2016   Generalized anxiety disorder 12/08/2015   Panic attacks 07/28/2015   Chest pain 07/09/2013   Sensorineural hearing loss 03/27/2013   Vitamin D deficiency 07/03/2012   H/O aortic dissection 05/12/2012   Routine general medical examination at a health care facility 09/20/2011   Atypical chest pain 09/15/2011   S/P AVR (aortic valve replacement) 01/07/2011   CORONARY ARTERY BYPASS GRAFT, HX OF 01/07/2011   Arthritis 12/20/2010   Back pain 12/15/2010   H/O dissecting abdominal aortic aneurysm repair 08/24/2010   IRRITABLE BOWEL SYNDROME 09/09/2009   Obstructive sleep apnea 08/04/2009   External hemorrhoid 06/26/2009   Osteoporosis 01/09/2009   Hyperlipidemia 07/26/2007   Depression with anxiety 07/26/2007   Essential hypertension 07/26/2007   Allergic rhinitis 07/26/2007   Asthma 07/26/2007   GERD 07/26/2007   Osteoarthritis 07/26/2007   Ocular migraine 07/26/2007   Mild intermittent asthma without complication [id number]    PCP: Dr. Loura Pardon  REFERRING PROVIDER: Dr. Frederico Hamman Copland  REFERRING DIAG:  [phone] (ICD-10-CM) - Chronic left shoulder pain  M19.012 (ICD-10-CM) - Arthritis of left glenohumeral joint    THERAPY DIAG:  Muscle weakness (generalized)  Acute pain of left shoulder  Limited range of motion (ROM) of shoulder  Rationale for Evaluation and Treatment: Rehabilitation  ONSET DATE: around Jan 2024 (insidious onset)   SUBJECTIVE:  SUBJECTIVE STATEMENT: Patient reports she is here due to some recent left shoulder pain with difficulty moving her left arm - reaching and performing daily tasks.  PERTINENT HISTORY: Rachel Torres is a 67 year old female with recent history of Left shoulder pain. Referral sent for left shoulder pain and arthritis of left glenohumeral joint. She was treated successfully in past for right shoulder pain and has past medical history of Right shoulder surgery replacement. 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 She reports most limited reaching her arm out and donning bra/clothes. See list attached for PMH.   PAIN:  Are you having pain? Yes: NPRS scale: 0/10 at rest up to 4-5/10 Pain location: Left ant and post shoulder/arm Pain description: ache mostly with sharp pain with movement Aggravating factors: Don bra, lifting with left arm, reaching forward and out to side Relieving factors: ice some, rest, tylenol prn, heat   PRECAUTIONS: None  WEIGHT BEARING RESTRICTIONS: No  FALLS:  Has patient fallen in last 6 months? Yes. 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  PLOF: Independent  PATIENT GOALS:To be able to use my shoulder better and without pain  NEXT MD VISIT: None scheduled  OBJECTIVE:   DIAGNOSTIC FINDINGS:  Quick Dash   PATIENT SURVEYS:  FOTO 44/predicted goal- 71  COGNITION: Overall cognitive status: Within functional limits for tasks assessed     SENSATION: WFL  POSTURE: Forward shoulders  UPPER EXTREMITY ROM:   Active ROM Right eval Left eval  Shoulder  flexion WNL  158 (painful arc)  Shoulder extension WNL  55  Shoulder abduction WNL  138 (painful)  Shoulder adduction WNL    Shoulder internal rotation WNL  75  Shoulder external rotation WNL  68 (painful)  Elbow flexion WNL  WNL   Elbow extension WNL  WNL   Wrist flexion    Wrist extension    Wrist ulnar deviation    Wrist radial deviation    Wrist pronation    Wrist supination    (Blank rows = not tested)  UPPER EXTREMITY MMT:  MMT Right eval Left eval  Shoulder flexion 5 3+  Shoulder extension 5 4  Shoulder abduction 5 3-  Shoulder adduction 5 3+   Shoulder internal rotation 5 3+  Shoulder external rotation 5 3+  Middle trapezius    Lower trapezius    Elbow flexion 5 4  Elbow extension 5 4  Wrist flexion    Wrist extension    Wrist ulnar deviation    Wrist radial deviation    Wrist pronation    Wrist supination    Grip strength (lbs)    (Blank rows = not tested)  SHOULDER SPECIAL TESTS: Impingement tests: Neer impingement test: positive , Hawkins/Kennedy impingement test: positive , and Painful arc test: positive   Rotator cuff assessment: Empty can test: positive  and Full can test: positive  Biceps assessment: Speed's test: positive   JOINT MOBILITY TESTING:  Hypomobile with GH- Inferior and A/P and P/A joint glides.  PALPATION:  +tenderness along supraspinatus insertion- lateral and posterior deltoid   TODAY'S TREATMENT:  DATE: PT evaluation of left shoulder- added HEP for ROM   PATIENT EDUCATION: Education details: PT plan of care; shoulder anatomy; goals Person educated: Patient Education method: Explanation, Demonstration, Tactile cues, Verbal cues, and Handouts Education comprehension: verbalized understanding, returned demonstration, verbal cues required, tactile cues required, and needs further education  HOME  EXERCISE PROGRAM: Access Code: [email or web address] URL: [email or web address] Date: 01/27/2023 Prepared by: Sande Brothers  Exercises - Supine Shoulder Flexion Extension AAROM with Dowel  - 1 x daily - 7 x weekly - 3 sets - 10 reps - Supine Shoulder External Rotation with Dowel  - 1 x daily - 7 x weekly - 3 sets - 10 reps - Supine Shoulder Abduction AAROM with Dowel  - 1 x daily - 7 x weekly - 3 sets - 10 reps  ASSESSMENT:  CLINICAL IMPRESSION: Patient is a 67 y.o. female who was seen today for physical therapy evaluation and treatment for Left shoulder pain. She presents today with pain limited left shoulder mobility with decreased overhead and functional rotation movement; left UE muscle weakness. She presents as self perceived moderate disability per quick dash and FOTO score. She is limited with functional use of left arm and will benefit from skilled PT services for pain management strategies, ROM activities, and therex for improved left UE strength and functional use or improved functional independence with all ADL's and improved quality of life.   OBJECTIVE IMPAIRMENTS: decreased ROM, decreased strength, hypomobility, impaired flexibility, impaired UE functional use, postural dysfunction, and pain.   ACTIVITY LIMITATIONS: carrying, lifting, dressing, reach over head, and caring for others  PARTICIPATION LIMITATIONS: meal prep, cleaning, laundry, driving, shopping, community activity, occupation, and yard work  PERSONAL FACTORS: 3+ comorbidities: OA, Depression, CHF, hx of aortic aneurrysm  are also affecting patient's functional outcome.   REHAB POTENTIAL: Good  CLINICAL DECISION MAKING: Evolving/moderate complexity  EVALUATION COMPLEXITY: Moderate   GOALS: Goals reviewed with patient? Yes  SHORT TERM GOALS: Target date: 03/10/2023  Pt will be independent with HEP in order to improve strength and decrease pain in order to improve pain-free function at home and  work.  Baseline: EVAL = No formal HEP In place Goal status: INITIAL   LONG TERM GOALS: Target date: 04/21/2023  Pt will improve FOTO to target score of 59 to display perceived improvements in ability to complete ADL's.  Baseline: EVAL= 44 Goal status: INITIAL  2.  Pt will decrease worst pain  in left shoulder as reported on NPRS by at least 3 points in order to demonstrate clinically significant reduction in pain.  Baseline: EVAL= 5/10 Goal status: INITIAL  3.  Pt will decrease quick DASH score by at least 8% in order to demonstrate clinically significant reduction in disability.  Baseline: EVAL=  Goal status: INITIAL  4.  Pt will increase strength of  by at least 1/2 MMT grade in order to demonstrate improvement in strength and function  Baseline: EVAL= 3+ left shoulder flex/abd Goal status: INITIAL  5.  Patient will report return to functional independence with ADLs including donning bra and reaching overhead without diffiuclty. Baseline: EVAL= Difficulty donning bra- requiring some assistance at times and very limited overhead functional use of Left shoulder Goal status: INITIAL    PLAN:  PT FREQUENCY: 1-2x/week  PT DURATION: 12 weeks  PLANNED INTERVENTIONS: Therapeutic exercises, Therapeutic activity, Patient/Family education, Self Care, Joint mobilization, Dry Needling, Electrical stimulation, Spinal manipulation, Spinal mobilization, Cryotherapy, Moist heat, Taping, and Manual therapy  PLAN FOR NEXT SESSION: Review and progress left  shoulder ROM; manual therapy for pain relief and ROM; Modalities as needed; review and progress HEP   Lewis Moccasin, PT 01/28/2023, 3:47 PM

## 2023-01-31 ENCOUNTER — Ambulatory Visit: Payer: Medicare HMO

## 2023-02-01 ENCOUNTER — Ambulatory Visit (INDEPENDENT_AMBULATORY_CARE_PROVIDER_SITE_OTHER): Payer: Medicare HMO | Admitting: Psychology

## 2023-02-01 DIAGNOSIS — F331 Major depressive disorder, recurrent, moderate: Secondary | ICD-10-CM | POA: Diagnosis not present

## 2023-02-01 NOTE — Progress Notes (Signed)
[place] Counselor/Therapist Progress Note  Patient ID: Rachel Torres, MRN: [id number],    Date: 02/01/2023  Time Spent: 10:00am-10:50am     50 minutes   Treatment Type: Individual Therapy  Reported Symptoms: stress  Mental Status Exam: Appearance:  Casual     Behavior: Appropriate  Motor: Normal  Speech/Language:  Normal Rate  Affect: Appropriate  Mood: normal  Thought process: normal  Thought content:   WNL  Sensory/Perceptual disturbances:   WNL  Orientation: oriented to person, place, time/date, and situation  Attention: Good  Concentration: Good  Memory: WNL  Fund of knowledge:  Good  Insight:   Good  Judgment:  Good  Impulse Control: Good   Risk Assessment: Danger to Self:  No Self-injurious Behavior: No Danger to Others: No Duty to Warn:no Physical Aggression / Violence:No  Access to Firearms a concern: No  Gang Involvement:No   Subjective:  Rachel Torres present for face-to-face individual therapy via video Webex.  Rachel consents to telehealth video session due to COVID 19 pandemic. Location of Rachel: home Location of therapist: home office.   Rachel talked about being at the beach with [name].  It is a relaxing trip so far and is nice to be away from the house construction.   Rachel talked about last week being way too hectic.   She had caregiver issues.   Addressed the issues and concerns.   Rachel talked about caregiving for [name].   His cognitive issues are challenging to deal with.  Addressed the challenges.  Worked on problem solving.  Rachel states she has not been feeling like crying as much.     Worked on self care strategies.   Provided supportive therapy.  Interventions: Cognitive Behavioral Therapy and Insight-Oriented  Diagnosis: F33.1   Plan of Care: Recommend ongoing therapy.   Rachel participated in setting treatment goals.   Rachel needs a place to talk about stressors.  Plan to meet every two weeks.    Treatment Plan (Treatment Plan Target Date:  03/23/2023) Client Abilities/Strengths  Rachel is bright, engaging, and motivated for therapy.   Client Treatment Preferences  Individual therapy.  Client Statement of Needs  Improve coping skills.  Have a place to talk about stressors.   Symptoms  Depressed or irritable mood. Unresolved grief issues.  Problems Addressed  Unipolar Depression Goals 1. Alleviate depressive symptoms and return to previous level of effective functioning. 2. Appropriately grieve the loss in order to normalize mood and to return to previously adaptive level of functioning. Objective Learn and implement behavioral strategies to overcome depression. Target Date: 2023-03-23 Frequency: Biweekly  Progress: 10 Modality: individual  Related Interventions Engage the client in "behavioral activation," increasing his/her activity level and contact with sources of reward, while identifying processes that inhibit activation.  Use behavioral techniques such as instruction, rehearsal, role-playing, role reversal, as needed, to facilitate activity in the client's daily life; reinforce success. Assist the client in developing skills that increase the likelihood of deriving pleasure from behavioral activation (e.g., assertiveness skills, developing an exercise plan, less internal/more external focus, increased social involvement); reinforce success. Objective Identify important people in life, past and present, and describe the quality, good and poor, of those relationships. Target Date: 2023-03-23 Frequency: Biweekly  Progress: 10 Modality: individual  Related Interventions Conduct Interpersonal Therapy beginning with the assessment of the client's "interpersonal inventory" of important past and present relationships; develop a case formulation linking depression to grief, interpersonal role disputes, role transitions, and/or interpersonal deficits). Objective Learn and  implement problem-solving and decision-making skills. Target  Date: 2023-03-23 Frequency: Biweekly  Progress: 10 Modality: individual  Related Interventions Conduct Problem-Solving Therapy using techniques such as psychoeducation, modeling, and role-playing to teach client problem-solving skills (i.e., defining a problem specifically, generating possible solutions, evaluating the pros and cons of each solution, selecting and implementing a plan of action, evaluating the efficacy of the plan, accepting or revising the plan); role-play application of the problem-solving skill to a real life issue. Encourage in the client the development of a positive problem orientation in which problems and solving them are viewed as a natural part of life and not something to be feared, despaired, or avoided. 3. Develop healthy interpersonal relationships that lead to the alleviation and help prevent the relapse of depression. 4. Develop healthy thinking patterns and beliefs about self, others, and the world that lead to the alleviation and help prevent the relapse of depression. 5. Recognize, accept, and cope with feelings of depression. Diagnosis F33.1  Conditions For Discharge Achievement of treatment goals and objectives   [name], LCSW

## 2023-02-08 ENCOUNTER — Encounter: Payer: Self-pay | Admitting: Dermatology

## 2023-02-09 ENCOUNTER — Ambulatory Visit: Payer: Medicare HMO

## 2023-02-09 DIAGNOSIS — M25512 Pain in left shoulder: Secondary | ICD-10-CM

## 2023-02-09 DIAGNOSIS — M6281 Muscle weakness (generalized): Secondary | ICD-10-CM | POA: Diagnosis not present

## 2023-02-09 DIAGNOSIS — M25619 Stiffness of unspecified shoulder, not elsewhere classified: Secondary | ICD-10-CM | POA: Diagnosis not present

## 2023-02-09 DIAGNOSIS — M542 Cervicalgia: Secondary | ICD-10-CM

## 2023-02-09 DIAGNOSIS — G8929 Other chronic pain: Secondary | ICD-10-CM | POA: Diagnosis not present

## 2023-02-09 DIAGNOSIS — M19012 Primary osteoarthritis, left shoulder: Secondary | ICD-10-CM | POA: Diagnosis not present

## 2023-02-09 DIAGNOSIS — M5459 Other low back pain: Secondary | ICD-10-CM | POA: Diagnosis not present

## 2023-02-09 NOTE — Therapy (Signed)
OUTPATIENT PHYSICAL THERAPY SHOULDER TREATMENT   Patient Name: Rachel Torres MRN: [id number] DOB:August 17, 1956, 67 y.o., female Today's Date: 02/09/2023  END OF SESSION:  PT End of Session - 02/09/23 1342     Visit Number 2    Number of Visits 24    Date for PT Re-Evaluation 04/21/23    Progress Note Due on Visit 10    PT Start Time 1345    PT Stop Time 1429    PT Time Calculation (min) 44 min    Activity Tolerance Patient tolerated treatment well;Patient limited by pain    Behavior During Therapy [facility] for tasks assessed/performed              Past Medical History:  Diagnosis Date   AAA (abdominal aortic aneurysm) ([place])    a. Chronic w/o evidence of aneurysmal dil on CTA 01/2016.   Alcohol abuse    Allergy    Anemia    Anxiety    Asthma    CAD (coronary artery disease)    a. 08/2010 s/p CABG x 1 (VG->RCA) @ time of Ao dissection repair; b. 01/2016 Lexiscan MV: mid antsept/apical defect w/ ? peri-infarct ischemia-->likely attenuation-->Med Rx.   Chewing difficulty    Chronic combined systolic (congestive) and diastolic (congestive) heart failure ([place])    a. 2012 EF 30-35%; b. 04/2015 EF 40-45%; c. 01/2016 Echo: Ef 55-65%, nl AoV bioprosthesis, nl RV, nl PASP.   Constipation    Depression    Edema of both lower extremities    Gallbladder sludge    GERD (gastroesophageal reflux disease)    Heart failure (HCC)    Heart valve problem    History of Bicuspid Aortic Valve    a. 08/2010 s/p AVR @ time of Ao dissection repair; b. 01/2016 Echo: Ef 55-65%, nl AoV bioprosthesis, nl RV, nl PASP.   Hx of repair of dissecting thoracic aortic aneurysm, Stanford type A    a. 08/2010 s/p repair with AVR and VG->RCA; b. 01/2016 CTA: stable appearance of Asc Thoracic Aortic graft. Opacification of flase lumen of chronic abd Ao dissection w/ retrograde flow through lumbar arteries. No aneurysmal dil of flase lumen.   Hyperlipidemia    Hypertension    Hypertensive heart disease    IBS  (irritable bowel syndrome)    Internal hemorrhoids    Kidney problem    Marfan syndrome    pt denies   Migraine    Obstructive sleep apnea    Osteoarthritis    Osteoporosis    Palpitations    Pneumonia    RA (rheumatoid arthritis) ([place])    a. Followed by Dr. [name]   Past Surgical History:  Procedure Laterality Date   ABDOMINAL AORTIC ANEURYSM REPAIR     CARDIAC CATHETERIZATION  2001   [place]     ESOPHAGOGASTRODUODENOSCOPY (EGD) WITH PROPOFOL N/A 05/08/2020   Procedure: ESOPHAGOGASTRODUODENOSCOPY (EGD) WITH PROPOFOL;  Surgeon: Virgel Manifold, MD;  Location: [facility] ENDOSCOPY;  Service: Endoscopy;  Laterality: N/A;   FRACTURE SURGERY Right    shoulder replacement   HEMORRHOID BANDING     ORIF DISTAL RADIUS FRACTURE Left    UPPER GASTROINTESTINAL ENDOSCOPY     Patient Active Problem List   Diagnosis Date Noted   Tinnitus 01/14/2023   Change in vision 01/14/2023   Colon cancer screening 01/14/2023   Personal history of fall 01/14/2023   Problem related to unspecified psychosocial circumstances 12/28/2022  Person encountering health services to consult on behalf of another person 12/28/2022   Palpitation 12/10/2022   Fatigue 10/26/2022   B12 deficiency 10/26/2022   Right shoulder pain 09/15/2022   Depression 09/14/2022   Class 2 severe obesity with serious comorbidity and body mass index (BMI) of 35.0 to 35.9 in adult [facility]) 09/14/2022   Other constipation 05/12/2022   Medicare annual wellness visit, initial 07/17/2021   TMJ (dislocation of temporomandibular joint) 07/17/2021   Encounter for screening mammogram for breast cancer 06/05/2021   Diarrhea 04/11/2020   Epigastric pain 02/19/2020   Dark stools 02/19/2020   History of atrial fibrillation 02/05/2019   Shortness of breath 01/27/2019   Irregular surface of cornea 12/25/2018   HPV (human papilloma virus) infection 12/01/2016   Screening mammogram,  encounter for 11/29/2016   Estrogen deficiency 11/29/2016   Encounter for routine gynecological examination 11/29/2016   Grade III hemorrhoids 10/06/2016   Tachycardia 08/14/2016   Hemorrhoids 08/13/2016   Atypical migraine 08/03/2016   CAD (coronary artery disease)    AAA (abdominal aortic aneurysm) (HCC)    Chronic combined systolic (congestive) and diastolic (congestive) heart failure ([place])    Prediabetes 02/11/2016   Generalized anxiety disorder 12/08/2015   Panic attacks 07/28/2015   Chest pain 07/09/2013   Sensorineural hearing loss 03/27/2013   Vitamin D deficiency 07/03/2012   H/O aortic dissection 05/12/2012   Routine general medical examination at a health care facility 09/20/2011   Atypical chest pain 09/15/2011   S/P AVR (aortic valve replacement) 01/07/2011   CORONARY ARTERY BYPASS GRAFT, HX OF 01/07/2011   Arthritis 12/20/2010   Back pain 12/15/2010   H/O dissecting abdominal aortic aneurysm repair 08/24/2010   IRRITABLE BOWEL SYNDROME 09/09/2009   Obstructive sleep apnea 08/04/2009   External hemorrhoid 06/26/2009   Osteoporosis 01/09/2009   Hyperlipidemia 07/26/2007   Depression with anxiety 07/26/2007   Essential hypertension 07/26/2007   Allergic rhinitis 07/26/2007   Asthma 07/26/2007   GERD 07/26/2007   Osteoarthritis 07/26/2007   Ocular migraine 07/26/2007   Mild intermittent asthma without complication [id number]    PCP: Dr. Loura Pardon  REFERRING PROVIDER: Dr. Frederico Hamman Copland  REFERRING DIAG:  [phone] (ICD-10-CM) - Chronic left shoulder pain  M19.012 (ICD-10-CM) - Arthritis of left glenohumeral joint    THERAPY DIAG:  Muscle weakness (generalized)  Acute pain of left shoulder  Limited range of motion (ROM) of shoulder  Cervicalgia  Rationale for Evaluation and Treatment: Rehabilitation  ONSET DATE: around Jan 2024 (insidious onset)   SUBJECTIVE:  SUBJECTIVE STATEMENT: Patient reports she has been trying to be aware of her posture.   PERTINENT HISTORY: Rachel Torres is a 67 year old female with recent history of Left shoulder pain. Referral sent for left shoulder pain and arthritis of left glenohumeral joint. She was treated successfully in past for right shoulder pain and has past medical history of Right shoulder surgery replacement. 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 She reports most limited reaching her arm out and donning bra/clothes. See list attached for PMH.   PAIN:  Are you having pain? Yes: NPRS scale: 0/10 at rest up to 4-5/10 Pain location: Left ant and post shoulder/arm Pain description: ache mostly with sharp pain with movement Aggravating factors: Don bra, lifting with left arm, reaching forward and out to side Relieving factors: ice some, rest, tylenol prn, heat   PRECAUTIONS: None  WEIGHT BEARING RESTRICTIONS: No  FALLS:  Has patient fallen in last 6 months? Yes. 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  PLOF: Independent  PATIENT GOALS:To be able to use my shoulder better and without pain  NEXT MD VISIT: None scheduled  OBJECTIVE:   DIAGNOSTIC FINDINGS:  Quick Dash =63.6  PATIENT SURVEYS:  FOTO 44/predicted goal- 96  COGNITION: Overall cognitive status: Within functional limits for tasks assessed     SENSATION: WFL  POSTURE: Forward shoulders  UPPER EXTREMITY ROM:   Active ROM Right eval Left eval  Shoulder flexion WNL  158 (painful arc)  Shoulder extension WNL  55   Shoulder abduction WNL  138 (painful)  Shoulder adduction WNL    Shoulder internal rotation WNL  75  Shoulder external rotation WNL  68 (painful)  Elbow flexion WNL  WNL   Elbow extension WNL  WNL   Wrist flexion    Wrist extension    Wrist ulnar deviation    Wrist radial deviation    Wrist pronation    Wrist supination    (Blank rows = not tested)  UPPER EXTREMITY MMT:  MMT Right eval Left eval  Shoulder flexion 5 3+  Shoulder extension 5 4  Shoulder abduction 5 3-  Shoulder adduction 5 3+   Shoulder internal rotation 5 3+  Shoulder external rotation 5 3+  Middle trapezius    Lower trapezius    Elbow flexion 5 4  Elbow extension 5 4  Wrist flexion    Wrist extension    Wrist ulnar deviation    Wrist radial deviation    Wrist pronation    Wrist supination    Grip strength (lbs)    (Blank rows = not tested)  SHOULDER SPECIAL TESTS: Impingement tests: Neer impingement test: positive , Hawkins/Kennedy impingement test: positive , and Painful arc test: positive   Rotator cuff assessment: Empty can test: positive  and Full can test: positive  Biceps assessment: Speed's test: positive   JOINT MOBILITY TESTING:  Hypomobile with GH- Inferior and A/P and P/A joint glides.  PALPATION:  +tenderness along supraspinatus insertion- lateral and posterior deltoid   TODAY'S TREATMENT:  DATE:   Manual: Grade II mobilizations AP PA, and inferior glide x2 minutes Grade II mobilizations UPA/CPA thoracic spine x4 minutes Distraction to L GH 30 seconds x 4 trials  Distraction with cross body mobilization x2 minutes-terminated due to pain  Scapular depression with retraction x2 minutes LLE PROM flexion with subscap stabilization x 10  TherEx: LUE AAROM with PVC pipe: flexion 10x-terminated due to pain Scapular retraction 10x  Trigger Point  Dry Needling (TDN), unbilled Education performed with patient regarding potential benefit of TDN. Reviewed precautions and risks with patient. Reviewed special precautions/risks over lung fields which include pneumothorax. Reviewed signs and symptoms of pneumothorax and advised pt to go to ER immediately if these symptoms develop advise them of dry needling treatment. Extensive time spent with pt to ensure full understanding of TDN risks. Pt provided verbal consent to treatment. TDN performed to  with 0.25 x 40 single needle placements with local twitch response (LTR). Pistoning technique utilized. Improved pain-free motion following intervention. L upper trap and subscap x 2 minutes  PATIENT EDUCATION: Education details: PT plan of care; shoulder anatomy; goals Person educated: Patient Education method: Explanation, Demonstration, Tactile cues, Verbal cues, and Handouts Education comprehension: verbalized understanding, returned demonstration, verbal cues required, tactile cues required, and needs further education  HOME EXERCISE PROGRAM: Access Code: [email or web address] URL: [email or web address] Date: 01/27/2023 Prepared by: Sande Brothers  Exercises - Supine Shoulder Flexion Extension AAROM with Dowel  - 1 x daily - 7 x weekly - 3 sets - 10 reps - Supine Shoulder External Rotation with Dowel  - 1 x daily - 7 x weekly - 3 sets - 10 reps - Supine Shoulder Abduction AAROM with Dowel  - 1 x daily - 7 x weekly - 3 sets - 10 reps  ASSESSMENT:  CLINICAL IMPRESSION: Patient has pain with all attempted PROM, AROM, and AAROM. Patient highly recommended to get MRI of shoulder due to possible rotator cuff involvement. Patient agreeable to reach out to physician due to significant limitations. She is limited with functional use of left arm and will benefit from skilled PT services for pain management strategies, ROM activities, and therex for improved left UE strength and functional use or  improved functional independence with all ADL's and improved quality of life.   OBJECTIVE IMPAIRMENTS: decreased ROM, decreased strength, hypomobility, impaired flexibility, impaired UE functional use, postural dysfunction, and pain.   ACTIVITY LIMITATIONS: carrying, lifting, dressing, reach over head, and caring for others  PARTICIPATION LIMITATIONS: meal prep, cleaning, laundry, driving, shopping, community activity, occupation, and yard work  PERSONAL FACTORS: 3+ comorbidities: OA, Depression, CHF, hx of aortic aneurrysm  are also affecting patient's functional outcome.   REHAB POTENTIAL: Good  CLINICAL DECISION MAKING: Evolving/moderate complexity  EVALUATION COMPLEXITY: Moderate   GOALS: Goals reviewed with patient? Yes  SHORT TERM GOALS: Target date: 03/10/2023  Pt will be independent with HEP in order to improve strength and decrease pain in order to improve pain-free function at home and work.  Baseline: EVAL = No formal HEP In place Goal status: INITIAL   LONG TERM GOALS: Target date: 04/21/2023  Pt will improve FOTO to target score of 59 to display perceived improvements in ability to complete ADL's.  Baseline: EVAL= 44 Goal status: INITIAL  2.  Pt will decrease worst pain  in left shoulder as reported on NPRS by at least 3 points in order to demonstrate clinically significant reduction in pain.  Baseline: EVAL= 5/10 Goal status: INITIAL  3.  Pt will decrease quick  DASH score by at least 8% in order to demonstrate clinically significant reduction in disability.  Baseline: EVAL= 63.6 Goal status: INITIAL  4.  Pt will increase strength of  by at least 1/2 MMT grade in order to demonstrate improvement in strength and function  Baseline: EVAL= 3+ left shoulder flex/abd Goal status: INITIAL  5.  Patient will report return to functional independence with ADLs including donning bra and reaching overhead without diffiuclty. Baseline: EVAL= Difficulty donning bra-  requiring some assistance at times and very limited overhead functional use of Left shoulder Goal status: INITIAL    PLAN:  PT FREQUENCY: 1-2x/week  PT DURATION: 12 weeks  PLANNED INTERVENTIONS: Therapeutic exercises, Therapeutic activity, Patient/Family education, Self Care, Joint mobilization, Dry Needling, Electrical stimulation, Spinal manipulation, Spinal mobilization, Cryotherapy, Moist heat, Taping, and Manual therapy  PLAN FOR NEXT SESSION: Review and progress left shoulder ROM; manual therapy for pain relief and ROM; Modalities as needed; review and progress HEP   Janna Arch, PT 02/09/2023, 5:49 PM

## 2023-02-11 DIAGNOSIS — H6121 Impacted cerumen, right ear: Secondary | ICD-10-CM | POA: Diagnosis not present

## 2023-02-11 DIAGNOSIS — H9313 Tinnitus, bilateral: Secondary | ICD-10-CM | POA: Diagnosis not present

## 2023-02-13 NOTE — Progress Notes (Unsigned)
    Rachel Delduca T. Jelissa Espiritu, MD, [place] [facility] [place], [address]  Phone: [phone]  FAX: [place] - 67 y.o. female  MRN [id number]  Date of Birth: Jul 01, 1956  Date: 02/14/2023  PCP: [name], MD  Referral: [name], MD  No chief complaint on file.  Subjective:   Rachel Torres is a 67 y.o. very pleasant female patient with There is no height or weight on file to calculate BMI. who presents with the following:  She is a pleasant patient who I have seen a number of times over the years.  I last saw her 09/2022 with some chronic pain of the left shoulder and right knee pain, both felt to be OA flares.  I did a shoulder injection and a R knee injection at that time for symptomatic relief.    She presents today with back, shoulder, and leg pain.     Review of Systems is noted in the HPI, as appropriate  Objective:   There were no vitals taken for this visit.  GEN: No acute distress; alert,appropriate. PULM: Breathing comfortably in no respiratory distress PSYCH: Normally interactive.   Laboratory and Imaging Data:  Assessment and Plan:   ***

## 2023-02-14 ENCOUNTER — Other Ambulatory Visit: Payer: Self-pay | Admitting: Otolaryngology

## 2023-02-14 ENCOUNTER — Ambulatory Visit (INDEPENDENT_AMBULATORY_CARE_PROVIDER_SITE_OTHER): Payer: Medicare HMO | Admitting: Family Medicine

## 2023-02-14 ENCOUNTER — Ambulatory Visit: Payer: Medicare HMO

## 2023-02-14 ENCOUNTER — Encounter: Payer: Self-pay | Admitting: Family Medicine

## 2023-02-14 ENCOUNTER — Ambulatory Visit (INDEPENDENT_AMBULATORY_CARE_PROVIDER_SITE_OTHER)
Admission: RE | Admit: 2023-02-14 | Discharge: 2023-02-14 | Disposition: A | Discharge status: Home / Self Care | Payer: Medicare HMO | Source: Ambulatory Visit | Attending: Family Medicine | Admitting: Family Medicine

## 2023-02-14 VITALS — BP 110/64 | HR 84 | Temp 97.6°F | Ht 65.0 in | Wt 210.0 lb

## 2023-02-14 DIAGNOSIS — R29898 Other symptoms and signs involving the musculoskeletal system: Secondary | ICD-10-CM

## 2023-02-14 DIAGNOSIS — M25619 Stiffness of unspecified shoulder, not elsewhere classified: Secondary | ICD-10-CM | POA: Diagnosis not present

## 2023-02-14 DIAGNOSIS — M25512 Pain in left shoulder: Secondary | ICD-10-CM

## 2023-02-14 DIAGNOSIS — M542 Cervicalgia: Secondary | ICD-10-CM | POA: Diagnosis not present

## 2023-02-14 DIAGNOSIS — M545 Low back pain, unspecified: Secondary | ICD-10-CM

## 2023-02-14 DIAGNOSIS — G8929 Other chronic pain: Secondary | ICD-10-CM | POA: Diagnosis not present

## 2023-02-14 DIAGNOSIS — M5459 Other low back pain: Secondary | ICD-10-CM | POA: Diagnosis not present

## 2023-02-14 DIAGNOSIS — M19012 Primary osteoarthritis, left shoulder: Secondary | ICD-10-CM | POA: Diagnosis not present

## 2023-02-14 DIAGNOSIS — M79605 Pain in left leg: Secondary | ICD-10-CM

## 2023-02-14 DIAGNOSIS — H9311 Tinnitus, right ear: Secondary | ICD-10-CM

## 2023-02-14 DIAGNOSIS — M6281 Muscle weakness (generalized): Secondary | ICD-10-CM

## 2023-02-14 NOTE — Therapy (Signed)
OUTPATIENT PHYSICAL THERAPY SHOULDER TREATMENT   Patient Name: Rachel Torres MRN: [id number] DOB:February 22, 1956, 67 y.o., female Today's Date: 02/14/2023  END OF SESSION:  PT End of Session - 02/14/23 1100     Visit Number 3    Number of Visits 24    Date for PT Re-Evaluation 04/21/23    Progress Note Due on Visit 10    PT Start Time 1100    Activity Tolerance Patient tolerated treatment well;Patient limited by pain    Behavior During Therapy [facility] for tasks assessed/performed               Past Medical History:  Diagnosis Date   AAA (abdominal aortic aneurysm) ([place])    a. Chronic w/o evidence of aneurysmal dil on CTA 01/2016.   Alcohol abuse    Allergy    Anemia    Anxiety    Asthma    CAD (coronary artery disease)    a. 08/2010 s/p CABG x 1 (VG->RCA) @ time of Ao dissection repair; b. 01/2016 Lexiscan MV: mid antsept/apical defect w/ ? peri-infarct ischemia-->likely attenuation-->Med Rx.   Chewing difficulty    Chronic combined systolic (congestive) and diastolic (congestive) heart failure ([place])    a. 2012 EF 30-35%; b. 04/2015 EF 40-45%; c. 01/2016 Echo: Ef 55-65%, nl AoV bioprosthesis, nl RV, nl PASP.   Constipation    Depression    Edema of both lower extremities    Gallbladder sludge    GERD (gastroesophageal reflux disease)    Heart failure (HCC)    Heart valve problem    History of Bicuspid Aortic Valve    a. 08/2010 s/p AVR @ time of Ao dissection repair; b. 01/2016 Echo: Ef 55-65%, nl AoV bioprosthesis, nl RV, nl PASP.   Hx of repair of dissecting thoracic aortic aneurysm, Stanford type A    a. 08/2010 s/p repair with AVR and VG->RCA; b. 01/2016 CTA: stable appearance of Asc Thoracic Aortic graft. Opacification of flase lumen of chronic abd Ao dissection w/ retrograde flow through lumbar arteries. No aneurysmal dil of flase lumen.   Hyperlipidemia    Hypertension    Hypertensive heart disease    IBS (irritable bowel syndrome)    Internal hemorrhoids    Kidney  problem    Marfan syndrome    pt denies   Migraine    Obstructive sleep apnea    Osteoarthritis    Osteoporosis    Palpitations    Pneumonia    RA (rheumatoid arthritis) ([place])    a. Followed by Dr. [name]   Past Surgical History:  Procedure Laterality Date   ABDOMINAL AORTIC ANEURYSM REPAIR     CARDIAC CATHETERIZATION  2001   [place]     ESOPHAGOGASTRODUODENOSCOPY (EGD) WITH PROPOFOL N/A 05/08/2020   Procedure: ESOPHAGOGASTRODUODENOSCOPY (EGD) WITH PROPOFOL;  Surgeon: Virgel Manifold, MD;  Location: [facility] ENDOSCOPY;  Service: Endoscopy;  Laterality: N/A;   FRACTURE SURGERY Right    shoulder replacement   HEMORRHOID BANDING     ORIF DISTAL RADIUS FRACTURE Left    UPPER GASTROINTESTINAL ENDOSCOPY     Patient Active Problem List   Diagnosis Date Noted   Tinnitus 01/14/2023   Change in vision 01/14/2023   Colon cancer screening 01/14/2023   Personal history of fall 01/14/2023   Problem related to unspecified psychosocial circumstances 12/28/2022   Person encountering health services to consult on behalf of another person 12/28/2022   Palpitation  12/10/2022   Fatigue 10/26/2022   B12 deficiency 10/26/2022   Right shoulder pain 09/15/2022   Depression 09/14/2022   Class 2 severe obesity with serious comorbidity and body mass index (BMI) of 35.0 to 35.9 in adult [facility]) 09/14/2022   Other constipation 05/12/2022   Medicare annual wellness visit, initial 07/17/2021   TMJ (dislocation of temporomandibular joint) 07/17/2021   Encounter for screening mammogram for breast cancer 06/05/2021   Diarrhea 04/11/2020   Epigastric pain 02/19/2020   Dark stools 02/19/2020   History of atrial fibrillation 02/05/2019   Shortness of breath 01/27/2019   Irregular surface of cornea 12/25/2018   HPV (human papilloma virus) infection 12/01/2016   Screening mammogram, encounter for 11/29/2016   Estrogen deficiency 11/29/2016    Encounter for routine gynecological examination 11/29/2016   Grade III hemorrhoids 10/06/2016   Tachycardia 08/14/2016   Hemorrhoids 08/13/2016   Atypical migraine 08/03/2016   CAD (coronary artery disease)    AAA (abdominal aortic aneurysm) (HCC)    Chronic combined systolic (congestive) and diastolic (congestive) heart failure ([place])    Prediabetes 02/11/2016   Generalized anxiety disorder 12/08/2015   Panic attacks 07/28/2015   Chest pain 07/09/2013   Sensorineural hearing loss 03/27/2013   Vitamin D deficiency 07/03/2012   H/O aortic dissection 05/12/2012   Routine general medical examination at a health care facility 09/20/2011   Atypical chest pain 09/15/2011   S/P AVR (aortic valve replacement) 01/07/2011   CORONARY ARTERY BYPASS GRAFT, HX OF 01/07/2011   Arthritis 12/20/2010   Back pain 12/15/2010   H/O dissecting abdominal aortic aneurysm repair 08/24/2010   IRRITABLE BOWEL SYNDROME 09/09/2009   Obstructive sleep apnea 08/04/2009   External hemorrhoid 06/26/2009   Osteoporosis 01/09/2009   Hyperlipidemia 07/26/2007   Depression with anxiety 07/26/2007   Essential hypertension 07/26/2007   Allergic rhinitis 07/26/2007   Asthma 07/26/2007   GERD 07/26/2007   Osteoarthritis 07/26/2007   Ocular migraine 07/26/2007   Mild intermittent asthma without complication [id number]    PCP: Dr. Loura Pardon  REFERRING PROVIDER: Dr. Frederico Hamman Copland  REFERRING DIAG:  [phone] (ICD-10-CM) - Chronic left shoulder pain  M19.012 (ICD-10-CM) - Arthritis of left glenohumeral joint    THERAPY DIAG:  Muscle weakness (generalized)  Acute pain of left shoulder  Limited range of motion (ROM) of shoulder  Rationale for Evaluation and Treatment: Rehabilitation  ONSET DATE: around Jan 2024 (insidious onset)   SUBJECTIVE:                                                                                                                                                                                       SUBJECTIVE STATEMENT: Patient reports  ongoing pain in left shoulder (top and lateral upper arm) and states going to her MD later today for further evaluation.   PERTINENT HISTORY: Rachel Torres is a 67 year old female with recent history of Left shoulder pain. Referral sent for left shoulder pain and arthritis of left glenohumeral joint. She was treated successfully in past for right shoulder pain and has past medical history of Right shoulder surgery replacement. 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 She reports most limited reaching her arm out and donning bra/clothes. See list attached for PMH.   PAIN:  Are you having pain? Yes: NPRS scale: 0/10 at rest up to 4-5/10 Pain location: Left ant and post shoulder/arm Pain description: ache mostly with sharp pain with movement Aggravating factors: Don bra, lifting with left arm, reaching forward and out to side Relieving factors: ice some, rest, tylenol prn, heat   PRECAUTIONS: None  WEIGHT BEARING RESTRICTIONS: No  FALLS:  Has patient fallen in last 6 months? Yes. 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  PLOF: Independent  PATIENT GOALS:To be able to use my shoulder better and without pain  NEXT MD VISIT: None scheduled  OBJECTIVE:   DIAGNOSTIC FINDINGS:  Quick Dash =63.6  PATIENT SURVEYS:  FOTO 44/predicted goal- 13  COGNITION: Overall cognitive status: Within functional limits for tasks assessed     SENSATION: WFL  POSTURE: Forward shoulders  UPPER EXTREMITY ROM:   Active ROM Right eval Left eval  Shoulder flexion WNL  158 (painful arc)  Shoulder extension WNL  55   Shoulder abduction WNL  138 (painful)  Shoulder adduction WNL    Shoulder internal rotation WNL  75  Shoulder external rotation WNL  68 (painful)  Elbow flexion WNL  WNL   Elbow extension WNL  WNL   Wrist flexion    Wrist extension    Wrist ulnar deviation    Wrist radial deviation    Wrist pronation    Wrist supination    (Blank rows = not tested)  UPPER EXTREMITY MMT:  MMT Right eval Left eval  Shoulder flexion 5 3+  Shoulder extension 5 4  Shoulder abduction 5 3-  Shoulder adduction 5 3+   Shoulder internal rotation 5 3+  Shoulder external rotation 5 3+  Middle trapezius    Lower trapezius    Elbow flexion 5 4  Elbow extension 5 4  Wrist flexion    Wrist extension    Wrist ulnar deviation    Wrist radial deviation    Wrist pronation    Wrist supination    Grip strength (lbs)    (Blank rows = not tested)  SHOULDER SPECIAL TESTS: Impingement tests: Neer impingement test: positive , Hawkins/Kennedy impingement test: positive , and Painful arc test: positive   Rotator cuff assessment: Empty can test: positive  and Full can test: positive  Biceps assessment: Speed's test: positive   JOINT MOBILITY TESTING:  Hypomobile with GH- Inferior and A/P and P/A joint glides.  PALPATION:  +tenderness along supraspinatus insertion- lateral and posterior deltoid   TODAY'S TREATMENT:  DATE:   Manual: Grade II mobilizations AP PA, and inferior glide x several minutes PROM flexion/abd in supine (patient with painful arc and pain at endrange)  Grade II mobilizations UPA/CPA thoracic spine x4 minutes Distraction to L GH 30 seconds x 4 trials  STM to left Upper trap in prone x several Min   TherEx: Review of scapular depression with Retraction 2 sets of 10 reps  TDN Treatment: (Unbilled) x 5 min Patient consent: After explanation of TDN  Rationale, Procedures, outcomes, and potential side effects, patient verbalized consent to TDN treatment. Region/Dx: Left shoulder pain Muscles Treated: L upper trap/levator region using 0.3x 30 with (+) local twitch response x 3 times- over region- using pistoning technique Post treatment pain/response: Patient reported no increase pain- Stated feeling better and looser- Muscle band less taut after DN Post treatment Instructions: Patient instructed to expect mild to moderate muscle soreness this evening and tomorrow. Patient instructed to continued prescribed home exercise program. Since dry needling potentially over lung field, patient instructed of signs and symptoms of pneumothorax. Patient also educated on signs and symptoms of infection, however unlikely, and to seek immediate medical attention shoulder thy occur. Patient verbalized understanding of these instructions.    PATIENT EDUCATION: Education details: PT plan of care; shoulder anatomy; goals Person educated: Patient Education method: Explanation, Demonstration, Tactile cues, Verbal cues, and Handouts Education comprehension: verbalized understanding, returned demonstration, verbal cues required, tactile cues required, and needs further education  HOME EXERCISE PROGRAM: Access Code: [email or web address] URL: [email or web address] Date: 01/27/2023 Prepared by: Sande Brothers  Exercises - Supine Shoulder Flexion Extension AAROM with Dowel  - 1 x daily - 7 x weekly - 3 sets - 10 reps - Supine Shoulder External Rotation with Dowel  - 1 x daily - 7 x weekly - 3 sets - 10 reps - Supine Shoulder Abduction AAROM with Dowel  - 1 x daily - 7 x weekly - 3 sets - 10 reps  ASSESSMENT:  CLINICAL IMPRESSION: Patient continues to be limited with left should pain with all ROM and at end range with PROM.  Patient responded well to PROM and less hypomobility with Left shoulder after manual therapy. She reported some Left UT relief after DN  today. Overall she presents with very limited left shoulder AROM and to follow up with MD for further evaluation. Will continue to progress scapular stabilization strategies to assist with functional mobility. Patient will benefit from continued skilled PT services for pain management strategies, ROM activities, and therex for improved left UE strength and functional use or improved functional independence with all ADL's and improved quality of life.   OBJECTIVE IMPAIRMENTS: decreased ROM, decreased strength, hypomobility, impaired flexibility, impaired UE functional use, postural dysfunction, and pain.   ACTIVITY LIMITATIONS: carrying, lifting, dressing, reach over head, and caring for others  PARTICIPATION LIMITATIONS: meal prep, cleaning, laundry, driving, shopping, community activity, occupation, and yard work  PERSONAL FACTORS: 3+ comorbidities: OA, Depression, CHF, hx of aortic aneurrysm  are also affecting patient's functional outcome.   REHAB POTENTIAL: Good  CLINICAL DECISION MAKING: Evolving/moderate complexity  EVALUATION COMPLEXITY: Moderate   GOALS: Goals reviewed with patient? Yes  SHORT TERM GOALS: Target date: 03/10/2023  Pt will be independent with HEP in order to improve strength and decrease pain in order to improve pain-free function at home and work.  Baseline: EVAL = No formal HEP In place Goal status: INITIAL   LONG TERM GOALS: Target date: 04/21/2023  Pt will improve FOTO to target score of 59  to display perceived improvements in ability to complete ADL's.  Baseline: EVAL= 44 Goal status: INITIAL  2.  Pt will decrease worst pain  in left shoulder as reported on NPRS by at least 3 points in order to demonstrate clinically significant reduction in pain.  Baseline: EVAL= 5/10 Goal status: INITIAL  3.  Pt will decrease quick DASH score by at least 8% in order to demonstrate clinically significant reduction in disability.  Baseline: EVAL= 63.6 Goal status:  INITIAL  4.  Pt will increase strength of  by at least 1/2 MMT grade in order to demonstrate improvement in strength and function  Baseline: EVAL= 3+ left shoulder flex/abd Goal status: INITIAL  5.  Patient will report return to functional independence with ADLs including donning bra and reaching overhead without diffiuclty. Baseline: EVAL= Difficulty donning bra- requiring some assistance at times and very limited overhead functional use of Left shoulder Goal status: INITIAL    PLAN:  PT FREQUENCY: 1-2x/week  PT DURATION: 12 weeks  PLANNED INTERVENTIONS: Therapeutic exercises, Therapeutic activity, Patient/Family education, Self Care, Joint mobilization, Dry Needling, Electrical stimulation, Spinal manipulation, Spinal mobilization, Cryotherapy, Moist heat, Taping, and Manual therapy  PLAN FOR NEXT SESSION: Review and progress left shoulder ROM; manual therapy for pain relief and ROM; Modalities as needed; review and progress HEP   Lewis Moccasin, PT 02/14/2023, 11:00 AM

## 2023-02-15 ENCOUNTER — Encounter: Payer: Self-pay | Admitting: *Deleted

## 2023-02-15 ENCOUNTER — Ambulatory Visit: Payer: Medicare HMO | Admitting: Psychology

## 2023-02-15 ENCOUNTER — Encounter: Payer: Self-pay | Admitting: Family Medicine

## 2023-02-15 DIAGNOSIS — H524 Presbyopia: Secondary | ICD-10-CM | POA: Diagnosis not present

## 2023-02-15 DIAGNOSIS — H52203 Unspecified astigmatism, bilateral: Secondary | ICD-10-CM | POA: Diagnosis not present

## 2023-02-15 DIAGNOSIS — H16403 Unspecified corneal neovascularization, bilateral: Secondary | ICD-10-CM | POA: Diagnosis not present

## 2023-02-15 DIAGNOSIS — H25813 Combined forms of age-related cataract, bilateral: Secondary | ICD-10-CM | POA: Diagnosis not present

## 2023-02-15 NOTE — Therapy (Signed)
OUTPATIENT PHYSICAL THERAPY SHOULDER TREATMENT/BACK EVAL   Patient Name: Rachel Torres MRN: [id number] DOB:1955/12/25, 67 y.o., female Today's Date: 02/16/2023  END OF SESSION:  PT End of Session - 02/16/23 1338     Visit Number 4    Number of Visits 24    Date for PT Re-Evaluation 04/21/23    Progress Note Due on Visit 10    PT Start Time 1345    PT Stop Time 1429    PT Time Calculation (min) 44 min    Activity Tolerance Patient tolerated treatment well;Patient limited by pain    Behavior During Therapy [facility] for tasks assessed/performed                Past Medical History:  Diagnosis Date   AAA (abdominal aortic aneurysm) ([place])    a. Chronic w/o evidence of aneurysmal dil on CTA 01/2016.   Alcohol abuse    Allergy    Anemia    Anxiety    Asthma    CAD (coronary artery disease)    a. 08/2010 s/p CABG x 1 (VG->RCA) @ time of Ao dissection repair; b. 01/2016 Lexiscan MV: mid antsept/apical defect w/ ? peri-infarct ischemia-->likely attenuation-->Med Rx.   Chewing difficulty    Chronic combined systolic (congestive) and diastolic (congestive) heart failure ([place])    a. 2012 EF 30-35%; b. 04/2015 EF 40-45%; c. 01/2016 Echo: Ef 55-65%, nl AoV bioprosthesis, nl RV, nl PASP.   Constipation    Depression    Edema of both lower extremities    Gallbladder sludge    GERD (gastroesophageal reflux disease)    Heart failure (HCC)    Heart valve problem    History of Bicuspid Aortic Valve    a. 08/2010 s/p AVR @ time of Ao dissection repair; b. 01/2016 Echo: Ef 55-65%, nl AoV bioprosthesis, nl RV, nl PASP.   Hx of repair of dissecting thoracic aortic aneurysm, Stanford type A    a. 08/2010 s/p repair with AVR and VG->RCA; b. 01/2016 CTA: stable appearance of Asc Thoracic Aortic graft. Opacification of flase lumen of chronic abd Ao dissection w/ retrograde flow through lumbar arteries. No aneurysmal dil of flase lumen.   Hyperlipidemia    Hypertension    Hypertensive heart disease     IBS (irritable bowel syndrome)    Internal hemorrhoids    Kidney problem    Marfan syndrome    pt denies   Migraine    Obstructive sleep apnea    Osteoarthritis    Osteoporosis    Palpitations    Pneumonia    RA (rheumatoid arthritis) ([place])    a. Followed by Dr. [name]   Past Surgical History:  Procedure Laterality Date   ABDOMINAL AORTIC ANEURYSM REPAIR     CARDIAC CATHETERIZATION  2001   [place]     ESOPHAGOGASTRODUODENOSCOPY (EGD) WITH PROPOFOL N/A 05/08/2020   Procedure: ESOPHAGOGASTRODUODENOSCOPY (EGD) WITH PROPOFOL;  Surgeon: Virgel Manifold, MD;  Location: [facility] ENDOSCOPY;  Service: Endoscopy;  Laterality: N/A;   FRACTURE SURGERY Right    shoulder replacement   HEMORRHOID BANDING     ORIF DISTAL RADIUS FRACTURE Left    UPPER GASTROINTESTINAL ENDOSCOPY     Patient Active Problem List   Diagnosis Date Noted   Tinnitus 01/14/2023   Change in vision 01/14/2023   Colon cancer screening 01/14/2023   Personal history of fall 01/14/2023   Problem related to unspecified psychosocial circumstances  12/28/2022   Person encountering health services to consult on behalf of another person 12/28/2022   Palpitation 12/10/2022   Fatigue 10/26/2022   B12 deficiency 10/26/2022   Right shoulder pain 09/15/2022   Depression 09/14/2022   Class 2 severe obesity with serious comorbidity and body mass index (BMI) of 35.0 to 35.9 in adult [facility]) 09/14/2022   Other constipation 05/12/2022   Medicare annual wellness visit, initial 07/17/2021   TMJ (dislocation of temporomandibular joint) 07/17/2021   Encounter for screening mammogram for breast cancer 06/05/2021   Diarrhea 04/11/2020   Epigastric pain 02/19/2020   Dark stools 02/19/2020   History of atrial fibrillation 02/05/2019   Shortness of breath 01/27/2019   Irregular surface of cornea 12/25/2018   HPV (human papilloma virus) infection 12/01/2016   Screening  mammogram, encounter for 11/29/2016   Estrogen deficiency 11/29/2016   Encounter for routine gynecological examination 11/29/2016   Grade III hemorrhoids 10/06/2016   Tachycardia 08/14/2016   Hemorrhoids 08/13/2016   Atypical migraine 08/03/2016   CAD (coronary artery disease)    AAA (abdominal aortic aneurysm) (HCC)    Chronic combined systolic (congestive) and diastolic (congestive) heart failure ([place])    Prediabetes 02/11/2016   Generalized anxiety disorder 12/08/2015   Panic attacks 07/28/2015   Chest pain 07/09/2013   Sensorineural hearing loss 03/27/2013   Vitamin D deficiency 07/03/2012   H/O aortic dissection 05/12/2012   Routine general medical examination at a health care facility 09/20/2011   Atypical chest pain 09/15/2011   S/P AVR (aortic valve replacement) 01/07/2011   CORONARY ARTERY BYPASS GRAFT, HX OF 01/07/2011   Arthritis 12/20/2010   Back pain 12/15/2010   H/O dissecting abdominal aortic aneurysm repair 08/24/2010   IRRITABLE BOWEL SYNDROME 09/09/2009   Obstructive sleep apnea 08/04/2009   External hemorrhoid 06/26/2009   Osteoporosis 01/09/2009   Hyperlipidemia 07/26/2007   Depression with anxiety 07/26/2007   Essential hypertension 07/26/2007   Allergic rhinitis 07/26/2007   Asthma 07/26/2007   GERD 07/26/2007   Osteoarthritis 07/26/2007   Ocular migraine 07/26/2007   Mild intermittent asthma without complication [id number]    PCP: Dr. Loura Pardon  REFERRING PROVIDER: Dr. Frederico Hamman Copland  REFERRING DIAG:  [phone] (ICD-10-CM) - Chronic left shoulder pain  M19.012 (ICD-10-CM) - Arthritis of left glenohumeral joint    THERAPY DIAG:  Muscle weakness (generalized) - Plan: PT plan of care cert/re-cert  Acute pain of left shoulder - Plan: PT plan of care cert/re-cert  Limited range of motion (ROM) of shoulder - Plan: PT plan of care cert/re-cert  Cervicalgia - Plan: PT plan of care cert/re-cert  Other low back pain - Plan: PT plan of care  cert/re-cert  Rationale for Evaluation and Treatment: Rehabilitation  ONSET DATE: around Jan 2024 (insidious onset)   SUBJECTIVE:  SUBJECTIVE STATEMENT: Patient presents with new order for low back pain radiating to L leg. Waiting to get imaging for shoulder on the 19th.   PERTINENT HISTORY: Shaina Mahan is a 67 year old female with recent history of Left shoulder pain. Referral sent for left shoulder pain and arthritis of left glenohumeral joint. She was treated successfully in past for right shoulder pain and has past medical history of Right shoulder surgery replacement. 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 She reports most limited reaching her arm out and donning bra/clothes. See list attached for PMH.   Back:  Patient's back pain began about a month , slow onset. Radiating down L leg, did not start radiating down leg until more recently. The more she walks the worse it gets. Is getting numbness on the outside of the leg, intermittent.  Worst pain in back 7/10, least amount 0/10, current 0/10.  PAIN:  Are you having pain? Yes: NPRS scale: 0/10 at rest up to 4-5/10 Pain location: Left ant and post shoulder/arm Pain description: ache mostly with sharp pain with movement Aggravating factors: Don bra, lifting with left arm, reaching forward and out to side Relieving factors: ice some, rest, tylenol prn, heat   PRECAUTIONS: None  WEIGHT BEARING RESTRICTIONS: No  FALLS:  Has patient fallen in last 6 months? Yes. 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  PLOF: Independent  PATIENT GOALS:To be able to use my shoulder better and without pain  NEXT MD VISIT: None scheduled  OBJECTIVE:   DIAGNOSTIC FINDINGS:  Quick Dash =63.6  PATIENT SURVEYS:  FOTO 44/predicted goal- 46  COGNITION: Overall cognitive status: Within functional limits for tasks assessed     SENSATION: WFL  POSTURE: Forward shoulders  UPPER EXTREMITY ROM:   Active ROM Right eval Left eval  Shoulder flexion WNL  158 (painful arc)  Shoulder extension WNL  55  Shoulder abduction WNL  138 (painful)  Shoulder adduction WNL    Shoulder internal rotation WNL  75  Shoulder external rotation WNL  68 (painful)  Elbow flexion WNL  WNL   Elbow extension WNL  WNL   Wrist flexion    Wrist extension    Wrist ulnar deviation    Wrist radial deviation    Wrist pronation    Wrist supination    (Blank rows = not tested)  Trunk Flexion 65  Trunk Extension 8  Trunk R SB WFL  Trunk L SB WFL * pain  Trunk R rotation WFL  Trunk L rotation WFL   Hamstring length R: L:    UPPER EXTREMITY MMT:  MMT Right eval Left eval  Shoulder flexion 5 3+  Shoulder extension 5 4  Shoulder abduction 5 3-  Shoulder adduction 5 3+   Shoulder internal rotation 5 3+  Shoulder external rotation 5 3+  Middle trapezius    Lower trapezius    Elbow flexion 5 4  Elbow extension 5 4  Wrist flexion    Wrist extension    Wrist ulnar deviation    Wrist radial deviation    Wrist pronation    Wrist supination    Grip strength (lbs)    (Blank rows = not tested)  Right Left  Hip flexion 4 4  Hip Abduction 4 4  Hip Adduction 4 4  Knee Extension  4 4  Knee Flexion 4 4  DF 4- 4-  PF 4- 4-    SHOULDER SPECIAL TESTS: Impingement tests: Neer impingement test: positive , Hawkins/Kennedy impingement test: positive , and Painful arc test: positive   Rotator cuff assessment: Empty can test: positive  and  Full can test: positive  Biceps assessment: Speed's test: positive   Lumbar special tests: SLR: negative Slump : positive L  Fair: positive Faber: negative Scour: negative Pelvis screening : negative   JOINT MOBILITY TESTING:  Hypomobile with GH- Inferior and A/P and P/A joint glides. L4-5: extreme pain causing spasm with grade II mobilization Hip L AP WFL Repeated extension with mobilization to spine: no increase in pain  PALPATION:  +tenderness along supraspinatus insertion- lateral and posterior deltoid   TODAY'S TREATMENT:                                                                                                                                         DATE:   Evaluation of back. HEP review  PATIENT EDUCATION: Education details: PT plan of care; shoulder anatomy; goals Person educated: Patient Education method: Explanation, Demonstration, Tactile cues, Verbal cues, and Handouts Education comprehension: verbalized understanding, returned demonstration, verbal cues required, tactile cues required, and needs further education  HOME EXERCISE PROGRAM: Access Code: [email or web address] URL: [email or web address] Date: 01/27/2023 Prepared by: Sande Brothers  Exercises - Supine Shoulder Flexion Extension AAROM with Dowel  - 1 x daily - 7 x weekly - 3 sets - 10 reps - Supine Shoulder External Rotation with Dowel  - 1 x daily - 7 x weekly - 3 sets - 10 reps - Supine Shoulder Abduction AAROM with Dowel  - 1 x daily - 7 x weekly - 3 sets - 10 reps  Access Code: [email or web address] URL: [email or web address] Date: 02/16/2023 Prepared by: Janna Arch  Exercises - Seated Piriformis Stretch  - 1 x daily - 7 x weekly - 2 sets - 10 reps - 30 hold - Supine Piriformis Stretch with Foot on Ground  - 1 x daily - 7 x weekly - 2 sets - 10 reps - 30 hold - Prone Press Up On Elbows  - 1 x daily - 7 x weekly - 2 sets - 10 reps - 5 hold - Supine Sciatic Nerve Glide  - 1 x  daily - 7 x weekly - 2 sets - 10 reps - 5 hold - Supine Sciatic Nerve Glide  - 1 x daily - 7 x weekly - 2 sets - 10 reps - 5 hold  ASSESSMENT:  CLINICAL IMPRESSION: Patient presents with new order for back pain radiating to L leg. Patient demonstrates symptoms indicative of L IT band syndrome, piriformis involvement and lumbar involvement. Stretching of IT band does relieve some symptoms but not  all indicating multiple areas of focus required. Back HEP given to patient with patient demonstrating understanding. Will pause on shoulder until imaging, patient agree with POC. Patient will benefit from continued skilled PT services for pain management strategies, ROM activities, and therex for improved strength, reduced pain and improved functional use or improved functional independence with all ADL's and improved quality of life.   OBJECTIVE IMPAIRMENTS: decreased ROM, decreased strength, hypomobility, impaired flexibility, impaired UE functional use, postural dysfunction, and pain.   ACTIVITY LIMITATIONS: carrying, lifting, dressing, reach over head, and caring for others  PARTICIPATION LIMITATIONS: meal prep, cleaning, laundry, driving, shopping, community activity, occupation, and yard work  PERSONAL FACTORS: 3+ comorbidities: OA, Depression, CHF, hx of aortic aneurrysm  are also affecting patient's functional outcome.   REHAB POTENTIAL: Good  CLINICAL DECISION MAKING: Evolving/moderate complexity  EVALUATION COMPLEXITY: Moderate   GOALS: Goals reviewed with patient? Yes  SHORT TERM GOALS: Target date: 03/10/2023  Pt will be independent with HEP in order to improve strength and decrease pain in order to improve pain-free function at home and work.  Baseline: EVAL = No formal HEP In place Goal status: INITIAL   LONG TERM GOALS: Target date: 04/21/2023  Pt will improve FOTO to target score of 59 to display perceived improvements in ability to complete ADL's.  Baseline: EVAL= 44 Goal  status: INITIAL  2.  Pt will decrease worst pain  in left shoulder as reported on NPRS by at least 3 points in order to demonstrate clinically significant reduction in pain.  Baseline: EVAL= 5/10 Goal status: INITIAL  3.  Pt will decrease quick DASH score by at least 8% in order to demonstrate clinically significant reduction in disability.  Baseline: EVAL= 63.6 Goal status: INITIAL  4.  Pt will increase strength of  by at least 1/2 MMT grade in order to demonstrate improvement in strength and function  Baseline: EVAL= 3+ left shoulder flex/abd Goal status: INITIAL  5.  Patient will report return to functional independence with ADLs including donning bra and reaching overhead without diffiuclty. Baseline: EVAL= Difficulty donning bra- requiring some assistance at times and very limited overhead functional use of Left shoulder Goal status: INITIAL  6.   Patient will reduce modified Oswestry score to <20 as to demonstrate minimal disability with ADLs including improved sleeping tolerance, walking/sitting tolerance etc for better mobility with ADLs.  Baseline: 64% Goal status: INITIAL   6.   Patient will report a worst pain of 3/10 on VAS in low back to improve tolerance with ADLs and reduced symptoms with activities.  Baseline: 2/28: 7/10  Goal status: INITIAL    PLAN:  PT FREQUENCY: 1-2x/week  PT DURATION: 12 weeks  PLANNED INTERVENTIONS: Therapeutic exercises, Therapeutic activity, Patient/Family education, Self Care, Joint mobilization, Dry Needling, Electrical stimulation, Spinal manipulation, Spinal mobilization, Cryotherapy, Moist heat, Taping, and Manual therapy  PLAN FOR NEXT SESSION: pause shoulder, focus on back. Core stability, L IT band release    Janna Arch, PT 02/16/2023, 2:29 PM

## 2023-02-16 ENCOUNTER — Ambulatory Visit: Payer: Medicare HMO

## 2023-02-16 DIAGNOSIS — G8929 Other chronic pain: Secondary | ICD-10-CM | POA: Diagnosis not present

## 2023-02-16 DIAGNOSIS — M25512 Pain in left shoulder: Secondary | ICD-10-CM

## 2023-02-16 DIAGNOSIS — M5459 Other low back pain: Secondary | ICD-10-CM | POA: Diagnosis not present

## 2023-02-16 DIAGNOSIS — M6281 Muscle weakness (generalized): Secondary | ICD-10-CM | POA: Diagnosis not present

## 2023-02-16 DIAGNOSIS — M542 Cervicalgia: Secondary | ICD-10-CM | POA: Diagnosis not present

## 2023-02-16 DIAGNOSIS — M19012 Primary osteoarthritis, left shoulder: Secondary | ICD-10-CM | POA: Diagnosis not present

## 2023-02-16 DIAGNOSIS — M25619 Stiffness of unspecified shoulder, not elsewhere classified: Secondary | ICD-10-CM | POA: Diagnosis not present

## 2023-02-17 ENCOUNTER — Ambulatory Visit (INDEPENDENT_AMBULATORY_CARE_PROVIDER_SITE_OTHER): Payer: Medicare HMO | Admitting: Family Medicine

## 2023-02-17 ENCOUNTER — Encounter (INDEPENDENT_AMBULATORY_CARE_PROVIDER_SITE_OTHER): Payer: Self-pay | Admitting: Family Medicine

## 2023-02-17 VITALS — BP 116/50 | HR 78 | Temp 97.8°F | Ht 65.0 in | Wt 205.0 lb

## 2023-02-17 DIAGNOSIS — E559 Vitamin D deficiency, unspecified: Secondary | ICD-10-CM

## 2023-02-17 DIAGNOSIS — Z6834 Body mass index (BMI) 34.0-34.9, adult: Secondary | ICD-10-CM | POA: Insufficient documentation

## 2023-02-17 DIAGNOSIS — R7303 Prediabetes: Secondary | ICD-10-CM | POA: Diagnosis not present

## 2023-02-17 DIAGNOSIS — E7849 Other hyperlipidemia: Secondary | ICD-10-CM

## 2023-02-17 DIAGNOSIS — F3289 Other specified depressive episodes: Secondary | ICD-10-CM | POA: Diagnosis not present

## 2023-02-17 DIAGNOSIS — E669 Obesity, unspecified: Secondary | ICD-10-CM | POA: Diagnosis not present

## 2023-02-17 MED ORDER — TOPIRAMATE 50 MG PO TABS: 50.0000 mg | ORAL_TABLET | Freq: Every evening | ORAL | 0 refills | Status: DC

## 2023-02-18 LAB — CMP14+EGFR
ALT: 18 IU/L (ref 0–32)
AST: 19 IU/L (ref 0–40)
Albumin/Globulin Ratio: 1.7 (ref 1.2–2.2)
Albumin: 4.5 g/dL (ref 3.9–4.9)
Alkaline Phosphatase: 78 IU/L (ref 44–121)
BUN/Creatinine Ratio: 16 (ref 12–28)
BUN: 14 mg/dL (ref 8–27)
Bilirubin Total: 0.4 mg/dL (ref 0.0–1.2)
CO2: 23 mmol/L (ref 20–29)
Calcium: 9 mg/dL (ref 8.7–10.3)
Chloride: 101 mmol/L (ref 96–106)
Creatinine, Ser: 0.85 mg/dL (ref 0.57–1.00)
Globulin, Total: 2.7 g/dL (ref 1.5–4.5)
Glucose: 116 mg/dL — ABNORMAL HIGH (ref 70–99)
Potassium: 4.7 mmol/L (ref 3.5–5.2)
Sodium: 140 mmol/L (ref 134–144)
Total Protein: 7.2 g/dL (ref 6.0–8.5)
eGFR: 76 mL/min/{1.73_m2} (ref >59)

## 2023-02-18 LAB — LIPID PANEL WITH LDL/HDL RATIO
Cholesterol, Total: 149 mg/dL (ref 100–199)
HDL: 64 mg/dL (ref >39)
LDL Chol Calc (NIH): 68 mg/dL (ref 0–99)
LDL/HDL Ratio: 1.1 ratio (ref 0.0–3.2)
Triglycerides: 95 mg/dL (ref 0–149)
VLDL Cholesterol Cal: 17 mg/dL (ref 5–40)

## 2023-02-18 LAB — INSULIN, RANDOM: INSULIN: 22.4 u[IU]/mL (ref 2.6–24.9)

## 2023-02-18 LAB — VITAMIN D 25 HYDROXY (VIT D DEFICIENCY, FRACTURES): Vit D, 25-Hydroxy: 36.4 ng/mL (ref 30.0–100.0)

## 2023-02-18 LAB — HEMOGLOBIN A1C
Est. average glucose Bld gHb Est-mCnc: 123 mg/dL
Hgb A1c MFr Bld: 5.9 % — ABNORMAL HIGH (ref 4.8–5.6)

## 2023-02-21 ENCOUNTER — Ambulatory Visit: Payer: Medicare HMO | Attending: Family Medicine

## 2023-02-21 DIAGNOSIS — M6281 Muscle weakness (generalized): Secondary | ICD-10-CM

## 2023-02-21 DIAGNOSIS — R278 Other lack of coordination: Secondary | ICD-10-CM | POA: Diagnosis not present

## 2023-02-21 DIAGNOSIS — R293 Abnormal posture: Secondary | ICD-10-CM

## 2023-02-21 DIAGNOSIS — R2689 Other abnormalities of gait and mobility: Secondary | ICD-10-CM | POA: Diagnosis not present

## 2023-02-21 DIAGNOSIS — M5459 Other low back pain: Secondary | ICD-10-CM | POA: Diagnosis not present

## 2023-02-21 NOTE — Therapy (Signed)
OUTPATIENT PHYSICAL THERAPY BACK TREATMENT   Patient Name: Rachel Torres MRN: [id number] DOB:1956/04/02, 67 y.o., female Today's Date: 02/22/2023  END OF SESSION:  PT End of Session - 02/21/23 1108     Visit Number 5    Number of Visits 24    Date for PT Re-Evaluation 04/21/23    Progress Note Due on Visit 10    PT Start Time 1102    PT Stop Time 1145    PT Time Calculation (min) 43 min    Activity Tolerance Patient tolerated treatment well;Patient limited by pain    Behavior During Therapy WFL for tasks assessed/performed                Past Medical History:  Diagnosis Date   AAA (abdominal aortic aneurysm) ([place])    a. Chronic w/o evidence of aneurysmal dil on CTA 01/2016.   Alcohol abuse    Allergy    Anemia    Anxiety    Asthma    CAD (coronary artery disease)    a. 08/2010 s/p CABG x 1 (VG->RCA) @ time of Ao dissection repair; b. 01/2016 Lexiscan MV: mid antsept/apical defect w/ ? peri-infarct ischemia-->likely attenuation-->Med Rx.   Chewing difficulty    Chronic combined systolic (congestive) and diastolic (congestive) heart failure ([place])    a. 2012 EF 30-35%; b. 04/2015 EF 40-45%; c. 01/2016 Echo: Ef 55-65%, nl AoV bioprosthesis, nl RV, nl PASP.   Constipation    Depression    Edema of both lower extremities    Gallbladder sludge    GERD (gastroesophageal reflux disease)    Heart failure (HCC)    Heart valve problem    History of Bicuspid Aortic Valve    a. 08/2010 s/p AVR @ time of Ao dissection repair; b. 01/2016 Echo: Ef 55-65%, nl AoV bioprosthesis, nl RV, nl PASP.   Hx of repair of dissecting thoracic aortic aneurysm, Stanford type A    a. 08/2010 s/p repair with AVR and VG->RCA; b. 01/2016 CTA: stable appearance of Asc Thoracic Aortic graft. Opacification of flase lumen of chronic abd Ao dissection w/ retrograde flow through lumbar arteries. No aneurysmal dil of flase lumen.   Hyperlipidemia    Hypertension    Hypertensive heart disease    IBS  (irritable bowel syndrome)    Internal hemorrhoids    Kidney problem    Marfan syndrome    pt denies   Migraine    Obstructive sleep apnea    Osteoarthritis    Osteoporosis    Palpitations    Pneumonia    RA (rheumatoid arthritis) ([place])    a. Followed by Dr. [name]   Past Surgical History:  Procedure Laterality Date   ABDOMINAL AORTIC ANEURYSM REPAIR     CARDIAC CATHETERIZATION  2001   Saline     ESOPHAGOGASTRODUODENOSCOPY (EGD) WITH PROPOFOL N/A 05/08/2020   Procedure: ESOPHAGOGASTRODUODENOSCOPY (EGD) WITH PROPOFOL;  Surgeon: Virgel Manifold, MD;  Location: [facility] ENDOSCOPY;  Service: Endoscopy;  Laterality: N/A;   FRACTURE SURGERY Right    shoulder replacement   HEMORRHOID BANDING     ORIF DISTAL RADIUS FRACTURE Left    UPPER GASTROINTESTINAL ENDOSCOPY     Patient Active Problem List   Diagnosis Date Noted   BMI 34.0-34.9,adult 02/17/2023   Obesity, Beginning BMI 35.45 02/17/2023   Tinnitus 01/14/2023   Change in vision 01/14/2023   Colon cancer screening 01/14/2023   Personal history  of fall 01/14/2023   Problem related to unspecified psychosocial circumstances 12/28/2022   Person encountering health services to consult on behalf of another person 12/28/2022   Palpitation 12/10/2022   Fatigue 10/26/2022   B12 deficiency 10/26/2022   Right shoulder pain 09/15/2022   Depression 09/14/2022   Class 2 severe obesity with serious comorbidity and body mass index (BMI) of 35.0 to 35.9 in adult [place] [facility]) 09/14/2022   Other constipation 05/12/2022   Medicare annual wellness visit, initial 07/17/2021   TMJ (dislocation of temporomandibular joint) 07/17/2021   Encounter for screening mammogram for breast cancer 06/05/2021   Diarrhea 04/11/2020   Epigastric pain 02/19/2020   Dark stools 02/19/2020   History of atrial fibrillation 02/05/2019   Shortness of breath 01/27/2019   Irregular surface of cornea 12/25/2018    HPV (human papilloma virus) infection 12/01/2016   Screening mammogram, encounter for 11/29/2016   Estrogen deficiency 11/29/2016   Encounter for routine gynecological examination 11/29/2016   Grade III hemorrhoids 10/06/2016   Tachycardia 08/14/2016   Hemorrhoids 08/13/2016   Atypical migraine 08/03/2016   CAD (coronary artery disease)    AAA (abdominal aortic aneurysm) (HCC)    Chronic combined systolic (congestive) and diastolic (congestive) heart failure ([place])    Prediabetes 02/11/2016   Generalized anxiety disorder 12/08/2015   Panic attacks 07/28/2015   Chest pain 07/09/2013   Sensorineural hearing loss 03/27/2013   Vitamin D deficiency 07/03/2012   H/O aortic dissection 05/12/2012   Routine general medical examination at a health care facility 09/20/2011   Atypical chest pain 09/15/2011   S/P AVR (aortic valve replacement) 01/07/2011   CORONARY ARTERY BYPASS GRAFT, HX OF 01/07/2011   Arthritis 12/20/2010   Back pain 12/15/2010   H/O dissecting abdominal aortic aneurysm repair 08/24/2010   IRRITABLE BOWEL SYNDROME 09/09/2009   Obstructive sleep apnea 08/04/2009   External hemorrhoid 06/26/2009   Osteoporosis 01/09/2009   Hyperlipidemia 07/26/2007   Depression with anxiety 07/26/2007   Essential hypertension 07/26/2007   Allergic rhinitis 07/26/2007   Asthma 07/26/2007   GERD 07/26/2007   Osteoarthritis 07/26/2007   Ocular migraine 07/26/2007   Mild intermittent asthma without complication [id number]    PCP: Dr. Loura Pardon  REFERRING PROVIDER: Dr. Frederico Hamman Copland  REFERRING DIAG:  [phone] (ICD-10-CM) - Chronic left shoulder pain  M19.012 (ICD-10-CM) - Arthritis of left glenohumeral joint    THERAPY DIAG:  Muscle weakness (generalized)  Other low back pain  Abnormal posture  Other abnormalities of gait and mobility  Other lack of coordination  Rationale for Evaluation and Treatment: Rehabilitation  ONSET DATE: around Jan 2024 (insidious onset)    SUBJECTIVE:  SUBJECTIVE STATEMENT:  Patient states her pain is not bad at the moment. Reports partial compliance with HEP.     PERTINENT HISTORY: Rachel Torres is a 67 year old female with recent history of Left shoulder pain. Referral sent for left shoulder pain and arthritis of left glenohumeral joint. She was treated successfully in past for right shoulder pain and has past medical history of Right shoulder surgery replacement. 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 She reports most limited reaching her arm out and donning bra/clothes. See list attached for PMH.   Back:  Patient's back pain began about a month , slow onset. Radiating down L leg, did not start radiating down leg until more recently. The more she walks the worse it gets. Is getting numbness on the outside of the leg, intermittent.  Worst pain in back 7/10, least amount 0/10, current 0/10.  PAIN:  Are you having pain? Yes: NPRS scale: 0/10 at rest up to 4-5/10 Pain location: Left ant and post shoulder/arm Pain description: ache mostly with sharp pain with movement Aggravating factors: Don bra, lifting with left arm, reaching forward and out to side Relieving factors: ice some, rest, tylenol prn, heat   PRECAUTIONS: None  WEIGHT BEARING RESTRICTIONS: No  FALLS:  Has patient fallen in last 6 months? Yes. 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  PLOF: Independent  PATIENT GOALS:To be able to use my shoulder better and  without pain  NEXT MD VISIT: None scheduled  OBJECTIVE:   DIAGNOSTIC FINDINGS:  Quick Dash =63.6  PATIENT SURVEYS:  FOTO 44/predicted goal- 27  COGNITION: Overall cognitive status: Within functional limits for tasks assessed     SENSATION: WFL  POSTURE: Forward shoulders  UPPER EXTREMITY ROM:   Active ROM Right eval Left eval  Shoulder flexion WNL  158 (painful arc)  Shoulder extension WNL  55  Shoulder abduction WNL  138 (painful)  Shoulder adduction WNL    Shoulder internal rotation WNL  75  Shoulder external rotation WNL  68 (painful)  Elbow flexion WNL  WNL   Elbow extension WNL  WNL   Wrist flexion    Wrist extension    Wrist ulnar deviation    Wrist radial deviation    Wrist pronation    Wrist supination    (Blank rows = not tested)  Trunk Flexion 65  Trunk Extension 8  Trunk R SB WFL  Trunk L SB WFL * pain  Trunk R rotation WFL  Trunk L rotation WFL   Hamstring length R: L:    UPPER EXTREMITY MMT:  MMT Right eval Left eval  Shoulder flexion 5 3+  Shoulder extension 5 4  Shoulder abduction 5 3-  Shoulder adduction 5 3+   Shoulder internal rotation 5 3+  Shoulder external rotation 5 3+  Middle trapezius    Lower trapezius    Elbow flexion 5 4  Elbow extension 5 4  Wrist flexion    Wrist extension    Wrist ulnar deviation    Wrist radial deviation    Wrist pronation    Wrist supination    Grip strength (lbs)    (Blank rows = not tested)   Right Left  Hip  flexion 4 4  Hip Abduction 4 4  Hip Adduction 4 4  Knee Extension  4 4  Knee Flexion 4 4  DF 4- 4-  PF 4- 4-    SHOULDER SPECIAL TESTS: Impingement tests: Neer impingement test: positive , Hawkins/Kennedy impingement test: positive , and Painful arc test: positive   Rotator cuff assessment: Empty can test: positive  and Full can test: positive  Biceps assessment: Speed's test: positive   Lumbar special tests: SLR: negative Slump : positive L  Fair: positive Faber:  negative Scour: negative Pelvis screening : negative   JOINT MOBILITY TESTING:  Hypomobile with GH- Inferior and A/P and P/A joint glides. L4-5: extreme pain causing spasm with grade II mobilization Hip L AP WFL Repeated extension with mobilization to spine: no increase in pain  PALPATION:  +tenderness along supraspinatus insertion- lateral and posterior deltoid   TODAY'S TREATMENT:                                                                                                                                         DATE:   Therex: focusing on Low back and LE until patient receives MRI on shoulder  Hamstring- active - up to 90 with no report of any radicular symptoms Sciatic nerve flossing (supine) x 20 reps each LE Standing lumbar ext at wall- facing  wall x 10 reps without any increase in pain in low back or LE's Piriformis - active stretch - hold 30 sec x 3 each LE (good recall of form)  Supine lower trunk rotation x 30 sec hold x 3 each LE (Left side tighter than right)   Manual therapy:  STM left IT band- 3 min with several areas of taut bands Grade 2 PA vertebral mobs to T12-L5- soreness reported but no increase pain or radicular symptoms Manual long axis LE stretch- Hold 60 sec in supine x 2 and 60 sec in prone x 2 (Patient reported feeling good stretch)  Rolling stick to Both proximal to mid IT band region- Patient reported some soreness- "Feels like it needs it"    PATIENT EDUCATION: Education details: PT plan of care; shoulder anatomy; goals Person educated: Patient Education method: Explanation, Demonstration, Tactile cues, Verbal cues, and Handouts Education comprehension: verbalized understanding, returned demonstration, verbal cues required, tactile cues required, and needs further education  HOME EXERCISE PROGRAM: Access Code: [email or web address] URL: [email or web address] Date: 01/27/2023 Prepared by: Sande Brothers  Exercises - Supine Shoulder  Flexion Extension AAROM with Dowel  - 1 x daily - 7 x weekly - 3 sets - 10 reps - Supine Shoulder External Rotation with Dowel  - 1 x daily - 7 x weekly - 3 sets - 10 reps - Supine Shoulder Abduction AAROM with Dowel  - 1 x daily - 7 x weekly - 3 sets - 10 reps  Access Code: [email or web address] URL: [email or web address] Date: 02/16/2023 Prepared by: Janna Arch  Exercises - Seated Piriformis Stretch  - 1 x daily - 7 x weekly - 2 sets - 10 reps - 30 hold - Supine Piriformis Stretch with Foot on Ground  - 1 x daily - 7 x weekly - 2 sets - 10 reps - 30 hold - Prone Press Up On Elbows  - 1 x daily - 7 x weekly - 2 sets - 10 reps - 5 hold - Supine Sciatic Nerve Glide  - 1 x daily - 7 x weekly - 2 sets - 10 reps - 5 hold - Supine Sciatic Nerve Glide  - 1 x daily - 7 x weekly - 2 sets - 10 reps - 5 hold  ASSESSMENT:  CLINICAL IMPRESSION: Treatment continued with shift of focus from Left shoulder to low back/LE pain. She responded well overall with no radicular symptoms throughout therex and manual treatment. Despite being hypermobile she does present with some stiffness as well throughout lower trunk/LE with left significant tighter than right. Patient reported feeling better after session and responded well to all activities- able to accept VC and visual demo for therex and return demonstration well.  Patient will benefit from continued skilled PT services for pain management strategies, ROM activities, and therex for improved strength, reduced pain and improved functional use or improved functional independence with all ADL's and improved quality of life.   OBJECTIVE IMPAIRMENTS: decreased ROM, decreased strength, hypomobility, impaired flexibility, impaired UE functional use, postural dysfunction, and pain.   ACTIVITY LIMITATIONS: carrying, lifting, dressing, reach over head, and caring for others  PARTICIPATION LIMITATIONS: meal prep, cleaning, laundry, driving, shopping, community  activity, occupation, and yard work  PERSONAL FACTORS: 3+ comorbidities: OA, Depression, CHF, hx of aortic aneurrysm  are also affecting patient's functional outcome.   REHAB POTENTIAL: Good  CLINICAL DECISION MAKING: Evolving/moderate complexity  EVALUATION COMPLEXITY: Moderate   GOALS: Goals reviewed with patient? Yes  SHORT TERM GOALS: Target date: 03/10/2023  Pt will be independent with HEP in order to improve strength and decrease pain in order to improve pain-free function at home and work.  Baseline: EVAL = No formal HEP In place Goal status: INITIAL   LONG TERM GOALS: Target date: 04/21/2023  Pt will improve FOTO to target score of 59 to display perceived improvements in ability to complete ADL's.  Baseline: EVAL= 44 Goal status: INITIAL  2.  Pt will decrease worst pain  in left shoulder as reported on NPRS by at least 3 points in order to demonstrate clinically significant reduction in pain.  Baseline: EVAL= 5/10 Goal status: INITIAL  3.  Pt will decrease quick DASH score by at least 8% in order to demonstrate clinically significant reduction in disability.  Baseline: EVAL= 63.6 Goal status: INITIAL  4.  Pt will increase strength of  by at least 1/2 MMT grade in order to demonstrate improvement in strength and function  Baseline: EVAL= 3+ left shoulder flex/abd Goal status: INITIAL  5.  Patient will report return to functional independence with ADLs including donning bra and reaching overhead without diffiuclty. Baseline: EVAL= Difficulty donning bra- requiring some assistance at times and very limited overhead functional use of Left shoulder Goal status: INITIAL  6.   Patient will reduce modified Oswestry score to <20 as to demonstrate minimal disability with ADLs including improved sleeping tolerance, walking/sitting tolerance etc for better mobility with ADLs.  Baseline: 64% Goal status: INITIAL   6.   Patient will report a worst pain of 3/10 on VAS in low  back  to improve tolerance with ADLs and reduced symptoms with activities.  Baseline: 2/28: 7/10  Goal status: INITIAL    PLAN:  PT FREQUENCY: 1-2x/week  PT DURATION: 12 weeks  PLANNED INTERVENTIONS: Therapeutic exercises, Therapeutic activity, Patient/Family education, Self Care, Joint mobilization, Dry Needling, Electrical stimulation, Spinal manipulation, Spinal mobilization, Cryotherapy, Moist heat, Taping, and Manual therapy  PLAN FOR NEXT SESSION: pause shoulder, focus on back. Core stability, L IT band release - May try DN if appropriate next 1-2 sessions.    Lewis Moccasin, PT 02/22/2023, 11:14 AM

## 2023-02-22 ENCOUNTER — Ambulatory Visit (INDEPENDENT_AMBULATORY_CARE_PROVIDER_SITE_OTHER): Payer: Medicare HMO | Admitting: Psychology

## 2023-02-22 DIAGNOSIS — F331 Major depressive disorder, recurrent, moderate: Secondary | ICD-10-CM | POA: Diagnosis not present

## 2023-02-22 NOTE — Progress Notes (Signed)
[facility] Counselor/Therapist Progress Note  Patient ID: JACQUES NICOLAY, MRN: [id number],    Date: 02/22/2023  Time Spent: 12:00pm-12:55pm     55 minutes   Treatment Type: Individual Therapy  Reported Symptoms: stress  Mental Status Exam: Appearance:  Casual     Behavior: Appropriate  Motor: Normal  Speech/Language:  Normal Rate  Affect: Appropriate  Mood: normal  Thought process: normal  Thought content:   WNL  Sensory/Perceptual disturbances:   WNL  Orientation: oriented to person, place, time/date, and situation  Attention: Good  Concentration: Good  Memory: WNL  Fund of knowledge:  Good  Insight:   Good  Judgment:  Good  Impulse Control: Good   Risk Assessment: Danger to Self:  No Self-injurious Behavior: No Danger to Others: No Duty to Warn:no Physical Aggression / Violence:No  Access to Firearms a concern: No  Gang Involvement:No   Subjective:  [name] present for face-to-face individual therapy via video Webex.  Pt consents to telehealth video session due to COVID 19 pandemic. Location of pt: home Location of therapist: home office.   Pt talked about  her health issues.  She has had ringing in her ears and has had issues with her shoulder and back and hips.   She is starting physical therapy and has scheduled doctors' appointments.   Pt states she is still struggling to lose weight.  She often wants to eat something all day.  Worked on healthy coping strategies.  Pt talked about caregiving for [name].   His cognitive issues are challenging to deal with.  [name] gets frustrated when he can't do things he use to be able to do and then gets mad at pt for not being able to do them.  Addressed the challenges.  Worked on problem solving.   Worked on self care strategies.   Provided supportive therapy.  Interventions: Cognitive Behavioral Therapy and Insight-Oriented  Diagnosis: F33.1   Plan of Care: Recommend ongoing therapy.   Pt participated in  setting treatment goals.   Pt needs a place to talk about stressors.  Plan to meet every two weeks.    Treatment Plan (Treatment Plan Target Date: 03/23/2023) Client Abilities/Strengths  Pt is bright, engaging, and motivated for therapy.   Client Treatment Preferences  Individual therapy.  Client Statement of Needs  Improve coping skills.  Have a place to talk about stressors.   Symptoms  Depressed or irritable mood. Unresolved grief issues.  Problems Addressed  Unipolar Depression Goals 1. Alleviate depressive symptoms and return to previous level of effective functioning. 2. Appropriately grieve the loss in order to normalize mood and to return to previously adaptive level of functioning. Objective Learn and implement behavioral strategies to overcome depression. Target Date: 2023-03-23 Frequency: Biweekly  Progress: 10 Modality: individual  Related Interventions Engage the client in "behavioral activation," increasing his/her activity level and contact with sources of reward, while identifying processes that inhibit activation.  Use behavioral techniques such as instruction, rehearsal, role-playing, role reversal, as needed, to facilitate activity in the client's daily life; reinforce success. Assist the client in developing skills that increase the likelihood of deriving pleasure from behavioral activation (e.g., assertiveness skills, developing an exercise plan, less internal/more external focus, increased social involvement); reinforce success. Objective Identify important people in life, past and present, and describe the quality, good and poor, of those relationships. Target Date: 2023-03-23 Frequency: Biweekly  Progress: 10 Modality: individual  Related Interventions Conduct Interpersonal Therapy beginning with the assessment of the client's "  interpersonal inventory" of important past and present relationships; develop a case formulation linking depression to grief, interpersonal  role disputes, role transitions, and/or interpersonal deficits). Objective Learn and implement problem-solving and decision-making skills. Target Date: 2023-03-23 Frequency: Biweekly  Progress: 10 Modality: individual  Related Interventions Conduct Problem-Solving Therapy using techniques such as psychoeducation, modeling, and role-playing to teach client problem-solving skills (i.e., defining a problem specifically, generating possible solutions, evaluating the pros and cons of each solution, selecting and implementing a plan of action, evaluating the efficacy of the plan, accepting or revising the plan); role-play application of the problem-solving skill to a real life issue. Encourage in the client the development of a positive problem orientation in which problems and solving them are viewed as a natural part of life and not something to be feared, despaired, or avoided. 3. Develop healthy interpersonal relationships that lead to the alleviation and help prevent the relapse of depression. 4. Develop healthy thinking patterns and beliefs about self, others, and the world that lead to the alleviation and help prevent the relapse of depression. 5. Recognize, accept, and cope with feelings of depression. Diagnosis F33.1  Conditions For Discharge Achievement of treatment goals and objectives   [name], LCSW

## 2023-02-22 NOTE — Therapy (Signed)
OUTPATIENT PHYSICAL THERAPY BACK TREATMENT   Patient Name: Rachel Torres MRN: [id number] DOB:[birth year], 67 y.o., female Today's Date: 02/23/2023  END OF SESSION:  PT End of Session - 02/23/23 1434     Visit Number 6    Number of Visits 24    Date for PT Re-Evaluation 04/21/23    Progress Note Due on Visit 10    PT Start Time 1430    PT Stop Time 1515    PT Time Calculation (min) 45 min    Activity Tolerance Patient tolerated treatment well;Patient limited by pain    Behavior During Therapy [facility] for tasks assessed/performed                 Past Medical History:  Diagnosis Date   AAA (abdominal aortic aneurysm) ([place])    a. Chronic w/o evidence of aneurysmal dil on CTA 01/2016.   Alcohol abuse    Allergy    Anemia    Anxiety    Asthma    CAD (coronary artery disease)    a. 08/2010 s/p CABG x 1 (VG->RCA) @ time of Ao dissection repair; b. 01/2016 Lexiscan MV: mid antsept/apical defect w/ ? peri-infarct ischemia-->likely attenuation-->Med Rx.   Chewing difficulty    Chronic combined systolic (congestive) and diastolic (congestive) heart failure ([place])    a. 2012 EF 30-35%; b. 04/2015 EF 40-45%; c. 01/2016 Echo: Ef 55-65%, nl AoV bioprosthesis, nl RV, nl PASP.   Constipation    Depression    Edema of both lower extremities    Gallbladder sludge    GERD (gastroesophageal reflux disease)    Heart failure (HCC)    Heart valve problem    History of Bicuspid Aortic Valve    a. 08/2010 s/p AVR @ time of Ao dissection repair; b. 01/2016 Echo: Ef 55-65%, nl AoV bioprosthesis, nl RV, nl PASP.   Hx of repair of dissecting thoracic aortic aneurysm, Stanford type A    a. 08/2010 s/p repair with AVR and VG->RCA; b. 01/2016 CTA: stable appearance of Asc Thoracic Aortic graft. Opacification of flase lumen of chronic abd Ao dissection w/ retrograde flow through lumbar arteries. No aneurysmal dil of flase lumen.   Hyperlipidemia    Hypertension    Hypertensive heart disease    IBS  (irritable bowel syndrome)    Internal hemorrhoids    Kidney problem    Marfan syndrome    pt denies   Migraine    Obstructive sleep apnea    Osteoarthritis    Osteoporosis    Palpitations    Pneumonia    RA (rheumatoid arthritis) ([place])    a. Followed by Dr. [name]   Past Surgical History:  Procedure Laterality Date   ABDOMINAL AORTIC ANEURYSM REPAIR     CARDIAC CATHETERIZATION  2001   [place]     ESOPHAGOGASTRODUODENOSCOPY (EGD) WITH PROPOFOL N/A 05/08/2020   Procedure: ESOPHAGOGASTRODUODENOSCOPY (EGD) WITH PROPOFOL;  Surgeon: Virgel Manifold, MD;  Location: [facility] ENDOSCOPY;  Service: Endoscopy;  Laterality: N/A;   FRACTURE SURGERY Right    shoulder replacement   HEMORRHOID BANDING     ORIF DISTAL RADIUS FRACTURE Left    UPPER GASTROINTESTINAL ENDOSCOPY     Patient Active Problem List   Diagnosis Date Noted   BMI 34.0-34.9,adult 02/17/2023   Obesity, Beginning BMI 35.45 02/17/2023   Tinnitus 01/14/2023   Change in vision 01/14/2023   Colon cancer screening 01/14/2023   Personal  history of fall 01/14/2023   Problem related to unspecified psychosocial circumstances 12/28/2022   Person encountering health services to consult on behalf of another person 12/28/2022   Palpitation 12/10/2022   Fatigue 10/26/2022   B12 deficiency 10/26/2022   Right shoulder pain 09/15/2022   Depression 09/14/2022   Class 2 severe obesity with serious comorbidity and body mass index (BMI) of 35.0 to 35.9 in adult [facility]) 09/14/2022   Other constipation 05/12/2022   Medicare annual wellness visit, initial 07/17/2021   TMJ (dislocation of temporomandibular joint) 07/17/2021   Encounter for screening mammogram for breast cancer 06/05/2021   Diarrhea 04/11/2020   Epigastric pain 02/19/2020   Dark stools 02/19/2020   History of atrial fibrillation 02/05/2019   Shortness of breath 01/27/2019   Irregular surface of cornea 12/25/2018    HPV (human papilloma virus) infection 12/01/2016   Screening mammogram, encounter for 11/29/2016   Estrogen deficiency 11/29/2016   Encounter for routine gynecological examination 11/29/2016   Grade III hemorrhoids 10/06/2016   Tachycardia 08/14/2016   Hemorrhoids 08/13/2016   Atypical migraine 08/03/2016   CAD (coronary artery disease)    AAA (abdominal aortic aneurysm) (HCC)    Chronic combined systolic (congestive) and diastolic (congestive) heart failure ([place])    Prediabetes 02/11/2016   Generalized anxiety disorder 12/08/2015   Panic attacks 07/28/2015   Chest pain 07/09/2013   Sensorineural hearing loss 03/27/2013   Vitamin D deficiency 07/03/2012   H/O aortic dissection 05/12/2012   Routine general medical examination at a health care facility 09/20/2011   Atypical chest pain 09/15/2011   S/P AVR (aortic valve replacement) 01/07/2011   CORONARY ARTERY BYPASS GRAFT, HX OF 01/07/2011   Arthritis 12/20/2010   Back pain 12/15/2010   H/O dissecting abdominal aortic aneurysm repair 08/24/2010   IRRITABLE BOWEL SYNDROME 09/09/2009   Obstructive sleep apnea 08/04/2009   External hemorrhoid 06/26/2009   Osteoporosis 01/09/2009   Hyperlipidemia 07/26/2007   Depression with anxiety 07/26/2007   Essential hypertension 07/26/2007   Allergic rhinitis 07/26/2007   Asthma 07/26/2007   GERD 07/26/2007   Osteoarthritis 07/26/2007   Ocular migraine 07/26/2007   Mild intermittent asthma without complication [id number]    PCP: Dr. Loura Pardon  REFERRING PROVIDER: Dr. Frederico Hamman Copland  REFERRING DIAG:  [phone] (ICD-10-CM) - Chronic left shoulder pain  M19.012 (ICD-10-CM) - Arthritis of left glenohumeral joint    THERAPY DIAG:  Muscle weakness (generalized)  Other low back pain  Abnormal posture  Other abnormalities of gait and mobility  Rationale for Evaluation and Treatment: Rehabilitation  ONSET DATE: around Jan 2024 (insidious onset)   SUBJECTIVE:  SUBJECTIVE STATEMENT:  Patient reports stressful events during her day. She has been compliant with HEP.    PERTINENT HISTORY: Rachel Torres is a 67 year old female with recent history of Left shoulder pain. Referral sent for left shoulder pain and arthritis of left glenohumeral joint. She was treated successfully in past for right shoulder pain and has past medical history of Right shoulder surgery replacement. 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 She reports most limited reaching her arm out and donning bra/clothes. See list attached for PMH.   Back:  Patient's back pain began about a month , slow onset. Radiating down L leg, did not start radiating down leg until more recently. The more she walks the worse it gets. Is getting numbness on the outside of the leg, intermittent.  Worst pain in back 7/10, least amount 0/10, current 0/10.  PAIN:  Are you having pain? Yes: NPRS scale: 0/10 at rest up to 4-5/10 Pain location: Left ant and post shoulder/arm Pain description: ache mostly with sharp pain with movement Aggravating factors: Don bra, lifting with left arm, reaching forward and out to side Relieving factors: ice some, rest, tylenol prn, heat   PRECAUTIONS: None  WEIGHT BEARING RESTRICTIONS: No  FALLS:  Has patient fallen in last 6 months? Yes. 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  PLOF: Independent  PATIENT GOALS:To be able to use my shoulder better and without pain  NEXT MD VISIT: None  scheduled  OBJECTIVE:   DIAGNOSTIC FINDINGS:  Quick Dash =63.6  PATIENT SURVEYS:  FOTO 44/predicted goal- 3  COGNITION: Overall cognitive status: Within functional limits for tasks assessed     SENSATION: WFL  POSTURE: Forward shoulders  UPPER EXTREMITY ROM:   Active ROM Right eval Left eval  Shoulder flexion WNL  158 (painful arc)  Shoulder extension WNL  55  Shoulder abduction WNL  138 (painful)  Shoulder adduction WNL    Shoulder internal rotation WNL  75  Shoulder external rotation WNL  68 (painful)  Elbow flexion WNL  WNL   Elbow extension WNL  WNL   Wrist flexion    Wrist extension    Wrist ulnar deviation    Wrist radial deviation    Wrist pronation    Wrist supination    (Blank rows = not tested)  Trunk Flexion 65  Trunk Extension 8  Trunk R SB WFL  Trunk L SB WFL * pain  Trunk R rotation WFL  Trunk L rotation WFL   Hamstring length R: L:    UPPER EXTREMITY MMT:  MMT Right eval Left eval  Shoulder flexion 5 3+  Shoulder extension 5 4  Shoulder abduction 5 3-  Shoulder adduction 5 3+   Shoulder internal rotation 5 3+  Shoulder external rotation 5 3+  Middle trapezius    Lower trapezius    Elbow flexion 5 4  Elbow extension 5 4  Wrist flexion    Wrist extension    Wrist ulnar deviation    Wrist radial deviation    Wrist pronation    Wrist supination    Grip strength (lbs)    (Blank rows = not tested)   Right Left  Hip flexion 4 4  Hip Abduction 4 4  Hip Adduction 4 4  Knee Extension  4 4  Knee Flexion 4 4  DF 4- 4-  PF 4- 4-    SHOULDER SPECIAL TESTS: Impingement tests: Neer impingement test: positive , Hawkins/Kennedy impingement test: positive , and Painful arc test: positive   Rotator cuff assessment: Empty can test: positive  and Full can test: positive  Biceps assessment: Speed's test: positive   Lumbar special tests: SLR: negative Slump : positive L  Fair: positive Faber: negative Scour: negative Pelvis  screening : negative   JOINT MOBILITY TESTING:  Hypomobile with GH- Inferior and A/P and P/A joint glides. L4-5: extreme pain causing spasm with grade II mobilization Hip L AP WFL Repeated extension with mobilization to spine: no increase in pain  PALPATION:  +tenderness along supraspinatus insertion- lateral and posterior deltoid   TODAY'S TREATMENT:                                                                                                                                         DATE:   Therex: focusing on Low back and LE until patient receives MRI on shoulder  Sciatic nerve flossing (supine) x 20 reps each LE Supine lower trunk rotation x 30 sec hold x 3 each LE (Left side tighter than right)  Posterior pelvic tilt 10x 5 second holds Posterior pelvic tilt with adduction ball squeeze 15x 45 degree abduction; bring back to neutral with core 10x each LE  TrA with mini march 10x each LE   Manual therapy:  Grade 2 PA vertebral mobs to T12-L5- soreness reported but no increase pain or radicular symptoms Piriformis stretch 60 seconds each LE    Trigger Point Dry Needling (TDN), unbilled Education performed with patient regarding potential benefit of TDN. Reviewed precautions and risks with patient. Reviewed special precautions/risks over lung fields which include pneumothorax. Reviewed signs and symptoms of pneumothorax and advised pt to go to ER immediately if these symptoms develop advise them of dry needling treatment. Extensive time spent with pt to ensure full understanding of TDN risks. Pt provided verbal consent to treatment. TDN performed to  with 0.25 x 40 single needle placements with local twitch response (LTR). Pistoning technique utilized. Improved pain-free motion following intervention. L IT band, bilateral low back and piriformis syndrome. X4 minutes     PATIENT EDUCATION: Education details: PT plan of care; shoulder anatomy; goals Person educated:  Patient Education method: Explanation, Demonstration, Tactile cues, Verbal cues, and Handouts Education comprehension: verbalized understanding, returned demonstration, verbal cues required, tactile cues required, and needs further education  HOME EXERCISE PROGRAM: Access Code: [email or web address] URL: [email or web address] Date: 01/27/2023 Prepared by: Sande Brothers  Exercises - Supine Shoulder Flexion Extension AAROM with Dowel  - 1 x daily - 7 x weekly - 3 sets - 10 reps - Supine Shoulder External Rotation with Dowel  - 1 x daily - 7  x weekly - 3 sets - 10 reps - Supine Shoulder Abduction AAROM with Dowel  - 1 x daily - 7 x weekly - 3 sets - 10 reps  Access Code: [email or web address] URL: [email or web address] Date: 02/16/2023 Prepared by: Janna Arch  Exercises - Seated Piriformis Stretch  - 1 x daily - 7 x weekly - 2 sets - 10 reps - 30 hold - Supine Piriformis Stretch with Foot on Ground  - 1 x daily - 7 x weekly - 2 sets - 10 reps - 30 hold - Prone Press Up On Elbows  - 1 x daily - 7 x weekly - 2 sets - 10 reps - 5 hold - Supine Sciatic Nerve Glide  - 1 x daily - 7 x weekly - 2 sets - 10 reps - 5 hold - Supine Sciatic Nerve Glide  - 1 x daily - 7 x weekly - 2 sets - 10 reps - 5 hold  ASSESSMENT:  CLINICAL IMPRESSION: Patient has significant trigger points released in IT and piriformis. Patient is highly motivated for progression of care. Patient is able to tolerate progressive core stabilization and strengthening interventions well.   Patient will benefit from continued skilled PT services for pain management strategies, ROM activities, and therex for improved strength, reduced pain and improved functional use or improved functional independence with all ADL's and improved quality of life.   OBJECTIVE IMPAIRMENTS: decreased ROM, decreased strength, hypomobility, impaired flexibility, impaired UE functional use, postural dysfunction, and pain.   ACTIVITY  LIMITATIONS: carrying, lifting, dressing, reach over head, and caring for others  PARTICIPATION LIMITATIONS: meal prep, cleaning, laundry, driving, shopping, community activity, occupation, and yard work  PERSONAL FACTORS: 3+ comorbidities: OA, Depression, CHF, hx of aortic aneurrysm  are also affecting patient's functional outcome.   REHAB POTENTIAL: Good  CLINICAL DECISION MAKING: Evolving/moderate complexity  EVALUATION COMPLEXITY: Moderate   GOALS: Goals reviewed with patient? Yes  SHORT TERM GOALS: Target date: 03/10/2023  Pt will be independent with HEP in order to improve strength and decrease pain in order to improve pain-free function at home and work.  Baseline: EVAL = No formal HEP In place Goal status: INITIAL   LONG TERM GOALS: Target date: 04/21/2023  Pt will improve FOTO to target score of 59 to display perceived improvements in ability to complete ADL's.  Baseline: EVAL= 44 Goal status: INITIAL  2.  Pt will decrease worst pain  in left shoulder as reported on NPRS by at least 3 points in order to demonstrate clinically significant reduction in pain.  Baseline: EVAL= 5/10 Goal status: INITIAL  3.  Pt will decrease quick DASH score by at least 8% in order to demonstrate clinically significant reduction in disability.  Baseline: EVAL= 63.6 Goal status: INITIAL  4.  Pt will increase strength of  by at least 1/2 MMT grade in order to demonstrate improvement in strength and function  Baseline: EVAL= 3+ left shoulder flex/abd Goal status: INITIAL  5.  Patient will report return to functional independence with ADLs including donning bra and reaching overhead without diffiuclty. Baseline: EVAL= Difficulty donning bra- requiring some assistance at times and very limited overhead functional use of Left shoulder Goal status: INITIAL  6.   Patient will reduce modified Oswestry score to <20 as to demonstrate minimal disability with ADLs including improved sleeping  tolerance, walking/sitting tolerance etc for better mobility with ADLs.  Baseline: 64% Goal status: INITIAL   6.   Patient will report a worst pain of 3/10 on VAS  in low back to improve tolerance with ADLs and reduced symptoms with activities.  Baseline: 2/28: 7/10  Goal status: INITIAL    PLAN:  PT FREQUENCY: 1-2x/week  PT DURATION: 12 weeks  PLANNED INTERVENTIONS: Therapeutic exercises, Therapeutic activity, Patient/Family education, Self Care, Joint mobilization, Dry Needling, Electrical stimulation, Spinal manipulation, Spinal mobilization, Cryotherapy, Moist heat, Taping, and Manual therapy  PLAN FOR NEXT SESSION: pause shoulder, focus on back. Core stability, L IT band release - May try DN if appropriate next 1-2 sessions.    Janna Arch, PT 02/23/2023, 4:48 PM

## 2023-02-23 ENCOUNTER — Ambulatory Visit: Payer: Medicare HMO

## 2023-02-23 DIAGNOSIS — R293 Abnormal posture: Secondary | ICD-10-CM

## 2023-02-23 DIAGNOSIS — M6281 Muscle weakness (generalized): Secondary | ICD-10-CM

## 2023-02-23 DIAGNOSIS — M5459 Other low back pain: Secondary | ICD-10-CM | POA: Diagnosis not present

## 2023-02-23 DIAGNOSIS — R278 Other lack of coordination: Secondary | ICD-10-CM | POA: Diagnosis not present

## 2023-02-23 DIAGNOSIS — R2689 Other abnormalities of gait and mobility: Secondary | ICD-10-CM | POA: Diagnosis not present

## 2023-02-26 ENCOUNTER — Ambulatory Visit
Admission: RE | Admit: 2023-02-26 | Discharge: 2023-02-26 | Disposition: A | Discharge status: Home / Self Care | Payer: Medicare HMO | Source: Ambulatory Visit | Attending: Family Medicine | Admitting: Family Medicine

## 2023-02-26 DIAGNOSIS — M25512 Pain in left shoulder: Secondary | ICD-10-CM

## 2023-02-26 DIAGNOSIS — R29898 Other symptoms and signs involving the musculoskeletal system: Secondary | ICD-10-CM

## 2023-02-26 DIAGNOSIS — S46012A Strain of muscle(s) and tendon(s) of the rotator cuff of left shoulder, initial encounter: Secondary | ICD-10-CM | POA: Diagnosis not present

## 2023-03-01 ENCOUNTER — Ambulatory Visit (INDEPENDENT_AMBULATORY_CARE_PROVIDER_SITE_OTHER): Payer: Medicare HMO | Admitting: Psychology

## 2023-03-01 ENCOUNTER — Other Ambulatory Visit: Payer: Self-pay | Admitting: Family Medicine

## 2023-03-01 DIAGNOSIS — F331 Major depressive disorder, recurrent, moderate: Secondary | ICD-10-CM | POA: Diagnosis not present

## 2023-03-01 DIAGNOSIS — S46012A Strain of muscle(s) and tendon(s) of the rotator cuff of left shoulder, initial encounter: Secondary | ICD-10-CM
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
## 2023-03-01 NOTE — Addendum Note (Signed)
Addended by: Owens Loffler on: 03/01/2023 11:26 AM   Modules accepted: Orders

## 2023-03-02 ENCOUNTER — Ambulatory Visit: Payer: Medicare HMO | Admitting: Physical Therapy

## 2023-03-07 ENCOUNTER — Ambulatory Visit: Payer: Medicare HMO

## 2023-03-07 ENCOUNTER — Ambulatory Visit (INDEPENDENT_AMBULATORY_CARE_PROVIDER_SITE_OTHER): Payer: Medicare HMO | Admitting: Psychology

## 2023-03-07 DIAGNOSIS — F331 Major depressive disorder, recurrent, moderate: Secondary | ICD-10-CM

## 2023-03-07 DIAGNOSIS — R278 Other lack of coordination: Secondary | ICD-10-CM

## 2023-03-07 DIAGNOSIS — M6281 Muscle weakness (generalized): Secondary | ICD-10-CM

## 2023-03-07 DIAGNOSIS — R2689 Other abnormalities of gait and mobility: Secondary | ICD-10-CM

## 2023-03-07 DIAGNOSIS — M5459 Other low back pain: Secondary | ICD-10-CM

## 2023-03-07 DIAGNOSIS — R293 Abnormal posture: Secondary | ICD-10-CM

## 2023-03-07 NOTE — Progress Notes (Unsigned)
Chief Complaint:   OBESITY Rachel Rachel is here to discuss her progress with her obesity treatment plan along with follow-up of her obesity related diagnoses. Rachel Rachel is on practicing portion control and making smarter food choices, such as increasing vegetables and decreasing simple carbohydrates and states she is following her eating plan approximately 0% of the time. Rachel Rachel states she is doing physical therapy.    Today's visit was #: 35 Starting weight: 213 lbs Starting date: 05/05/2020 Today's weight: 205 lbs Today's date: 02/17/2023 Total lbs lost to date: 8 Total lbs lost since last in-office visit: 0  Interim History: Rachel Rachel has had high levels of stress with home-improvement projects.  She has not been able to concentrate on her diet.  Subjective:   1. Vitamin D deficiency Rachel Rachel is on vitamin D, and she is due for labs.  2. Prediabetes Rachel Rachel is due for labs.  She states her PCP recommended that she restart Rachel Rachel, but without diabetes mellitus her insurance will not cover.  3. Other hyperlipidemia Rachel Rachel is on Rachel Torres and Rachel Rachel, and she is due for labs.  No side effects were noted.  4. Emotional Eating Behavior Rachel Torres suspects that she binge eats after watching some videos and wonders if there are any treatment options.  Assessment/Plan:   1. Vitamin D deficiency We will check labs today, and we will follow-up at Rachel Rachel's next visit.  - VITAMIN D 25 Hydroxy (Vit-D Deficiency, Fractures)  2. Prediabetes We will check labs today.  Rachel Rachel will continue to work on her diet, exercise, and weight loss.  - CMP14+EGFR - Insulin, random - Hemoglobin A1c  3. Other hyperlipidemia We will check labs today.  Rachel Rachel will continue Rachel Torres and Rachel Rachel, and she will continue with her diet and weight loss.  - Lipid Panel With LDL/HDL Ratio  4. Emotional Eating Behavior Rachel Rachel agreed to start Rachel Rachel 50 mg nightly with no refills.  We will follow-up at her next  visit.  - topiramate (Rachel Rachel) 50 MG tablet; Take 1 tablet (50 mg total) by mouth at bedtime.  Dispense: 30 tablet; Refill: 0  5. BMI 34.0-34.9,adult  6. Obesity, Beginning BMI 35.45 Rachel Rachel is currently in the action stage of change. As such, her goal is to continue with weight loss efforts. She has agreed to following Rachel lower carbohydrate, vegetable and lean protein rich diet plan.   Exercise goals: As is.   Behavioral modification strategies: increasing lean protein intake, better snacking choices, and emotional eating strategies.  Rachel Rachel has agreed to follow-up with our clinic in 4 weeks. She was informed of the importance of frequent follow-up visits to maximize her success with intensive lifestyle modifications for her multiple health conditions.   Rachel Rachel was informed we would discuss her lab results at her next visit unless there is Rachel critical issue that needs to be addressed sooner. Rachel Rachel agreed to keep her next visit at the agreed upon time to discuss these results.  Objective:   Blood pressure (!) 116/50, pulse 78, temperature 97.8 F (36.6 C), height 5\' 5"  (1.651 m), weight 205 lb (93 kg), SpO2 97 %. Body mass index is 34.11 kg/m.  Lab Results  Component Value Date   CREATININE 0.85 02/17/2023   BUN 14 02/17/2023   NA 140 02/17/2023   K 4.7 02/17/2023   CL 101 02/17/2023   CO2 23 02/17/2023   Lab Results  Component Value Date   ALT 18 02/17/2023   AST 19 02/17/2023   ALKPHOS 78 02/17/2023  BILITOT 0.4 02/17/2023   Lab Results  Component Value Date   HGBA1C 5.9 (H) 02/17/2023   HGBA1C 5.8 (H) 10/26/2022   HGBA1C 5.5 03/17/2022   HGBA1C 5.5 12/03/2021   HGBA1C 5.6 07/10/2021   Lab Results  Component Value Date   INSULIN 22.4 02/17/2023   INSULIN 11.7 10/26/2022   INSULIN 4.5 12/03/2021   INSULIN 17.3 02/26/2021   INSULIN 13.9 10/29/2020   Lab Results  Component Value Date   TSH 1.280 10/26/2022   Lab Results  Component Value Date   CHOL 149  02/17/2023   HDL 64 02/17/2023   LDLCALC 68 02/17/2023   LDLDIRECT 189.4 10/13/2011   TRIG 95 02/17/2023   CHOLHDL 3 03/17/2022   Lab Results  Component Value Date   VD25OH 36.4 02/17/2023   VD25OH 57.6 10/26/2022   VD25OH 57.7 12/03/2021   Lab Results  Component Value Date   WBC 5.1 10/26/2022   HGB 13.6 10/26/2022   HCT 42.0 10/26/2022   MCV 98 (H) 10/26/2022   PLT 333 10/26/2022   Lab Results  Component Value Date   FERRITIN 105.0 12/11/2010   Attestation Statements:   Reviewed by clinician on day of visit: allergies, medications, problem list, medical history, surgical history, family history, social history, and previous encounter notes.  Time spent on visit including pre-visit chart review and post-visit care and charting was 40 minutes.   I, Trixie Dredge, am acting as transcriptionist for [name], MD.  I have reviewed the above documentation for accuracy and completeness, and I agree with the above. -  ***

## 2023-03-07 NOTE — Therapy (Signed)
OUTPATIENT PHYSICAL THERAPY BACK TREATMENT/ PT DISCHARGE   Patient Name: Rachel Torres MRN: [id number] DOB:January 04, 1956, 67 y.o., female Today's Date: 03/07/2023  END OF SESSION:  PT End of Session - 03/07/23 1227     Visit Number 7    Number of Visits 24    Date for PT Re-Evaluation 04/21/23    Progress Note Due on Visit 10    PT Start Time 1100    PT Stop Time 1120    PT Time Calculation (min) 20 min    Activity Tolerance Other (comment)   Discussion of MRI- Non- billable visit   Behavior During Therapy WFL for tasks assessed/performed                  Past Medical History:  Diagnosis Date   AAA (abdominal aortic aneurysm) ([place])    a. Chronic w/o evidence of aneurysmal dil on CTA 01/2016.   Alcohol abuse    Allergy    Anemia    Anxiety    Asthma    CAD (coronary artery disease)    a. 08/2010 s/p CABG x 1 (VG->RCA) @ time of Ao dissection repair; b. 01/2016 Lexiscan MV: mid antsept/apical defect w/ ? peri-infarct ischemia-->likely attenuation-->Med Rx.   Chewing difficulty    Chronic combined systolic (congestive) and diastolic (congestive) heart failure ([place])    a. 2012 EF 30-35%; b. 04/2015 EF 40-45%; c. 01/2016 Echo: Ef 55-65%, nl AoV bioprosthesis, nl RV, nl PASP.   Constipation    Depression    Edema of both lower extremities    Gallbladder sludge    GERD (gastroesophageal reflux disease)    Heart failure (HCC)    Heart valve problem    History of Bicuspid Aortic Valve    a. 08/2010 s/p AVR @ time of Ao dissection repair; b. 01/2016 Echo: Ef 55-65%, nl AoV bioprosthesis, nl RV, nl PASP.   Hx of repair of dissecting thoracic aortic aneurysm, Stanford type A    a. 08/2010 s/p repair with AVR and VG->RCA; b. 01/2016 CTA: stable appearance of Asc Thoracic Aortic graft. Opacification of flase lumen of chronic abd Ao dissection w/ retrograde flow through lumbar arteries. No aneurysmal dil of flase lumen.   Hyperlipidemia    Hypertension    Hypertensive heart  disease    IBS (irritable bowel syndrome)    Internal hemorrhoids    Kidney problem    Marfan syndrome    pt denies   Migraine    Obstructive sleep apnea    Osteoarthritis    Osteoporosis    Palpitations    Pneumonia    RA (rheumatoid arthritis) ([place])    a. Followed by Dr. Marijean Torres   Past Surgical History:  Procedure Laterality Date   ABDOMINAL AORTIC ANEURYSM REPAIR     CARDIAC CATHETERIZATION  2001   [place]     ESOPHAGOGASTRODUODENOSCOPY (EGD) WITH PROPOFOL N/A 05/08/2020   Procedure: ESOPHAGOGASTRODUODENOSCOPY (EGD) WITH PROPOFOL;  Surgeon: Rachel Manifold, MD;  Location: [facility] ENDOSCOPY;  Service: Endoscopy;  Laterality: N/A;   FRACTURE SURGERY Right    shoulder replacement   HEMORRHOID BANDING     ORIF DISTAL RADIUS FRACTURE Left    UPPER GASTROINTESTINAL ENDOSCOPY     Patient Active Problem List   Diagnosis Date Noted   BMI 34.0-34.9,adult 02/17/2023   Obesity, Beginning BMI 35.45 02/17/2023   Tinnitus 01/14/2023   Change in vision 01/14/2023   Colon cancer  screening 01/14/2023   Personal history of fall 01/14/2023   Problem related to unspecified psychosocial circumstances 12/28/2022   Person encountering health services to consult on behalf of another person 12/28/2022   Palpitation 12/10/2022   Fatigue 10/26/2022   B12 deficiency 10/26/2022   Right shoulder pain 09/15/2022   Depression 09/14/2022   Class 2 severe obesity with serious comorbidity and body mass index (BMI) of 35.0 to 35.9 in adult [facility]) 09/14/2022   Other constipation 05/12/2022   Medicare annual wellness visit, initial 07/17/2021   TMJ (dislocation of temporomandibular joint) 07/17/2021   Encounter for screening mammogram for breast cancer 06/05/2021   Diarrhea 04/11/2020   Epigastric pain 02/19/2020   Dark stools 02/19/2020   History of atrial fibrillation 02/05/2019   Shortness of breath 01/27/2019   Irregular surface of cornea  12/25/2018   HPV (human papilloma virus) infection 12/01/2016   Screening mammogram, encounter for 11/29/2016   Estrogen deficiency 11/29/2016   Encounter for routine gynecological examination 11/29/2016   Grade III hemorrhoids 10/06/2016   Tachycardia 08/14/2016   Hemorrhoids 08/13/2016   Atypical migraine 08/03/2016   CAD (coronary artery disease)    AAA (abdominal aortic aneurysm) (HCC)    Chronic combined systolic (congestive) and diastolic (congestive) heart failure ([place])    Prediabetes 02/11/2016   Generalized anxiety disorder 12/08/2015   Panic attacks 07/28/2015   Chest pain 07/09/2013   Sensorineural hearing loss 03/27/2013   Vitamin D deficiency 07/03/2012   H/O aortic dissection 05/12/2012   Routine general medical examination at a health care facility 09/20/2011   Atypical chest pain 09/15/2011   S/P AVR (aortic valve replacement) 01/07/2011   CORONARY ARTERY BYPASS GRAFT, HX OF 01/07/2011   Arthritis 12/20/2010   Back pain 12/15/2010   H/O dissecting abdominal aortic aneurysm repair 08/24/2010   IRRITABLE BOWEL SYNDROME 09/09/2009   Obstructive sleep apnea 08/04/2009   External hemorrhoid 06/26/2009   Osteoporosis 01/09/2009   Hyperlipidemia 07/26/2007   Depression with anxiety 07/26/2007   Essential hypertension 07/26/2007   Allergic rhinitis 07/26/2007   Asthma 07/26/2007   GERD 07/26/2007   Osteoarthritis 07/26/2007   Ocular migraine 07/26/2007   Mild intermittent asthma without complication [id number]    PCP: Dr. Loura Torres  REFERRING PROVIDER: Dr. Frederico Hamman Torres  REFERRING DIAG:  [phone] (ICD-10-CM) - Chronic left shoulder pain  M19.012 (ICD-10-CM) - Arthritis of left glenohumeral joint    THERAPY DIAG:  Muscle weakness (generalized)  Other low back pain  Abnormal posture  Other abnormalities of gait and mobility  Other lack of coordination  Rationale for Evaluation and Treatment: Rehabilitation  ONSET DATE: around Jan 2024  (insidious onset)   SUBJECTIVE:  SUBJECTIVE STATEMENT:  Patient reports she talked with her MD regarding her MRI. He is recommending surgery and she states it is just not a simple choice as she is the primary caregiver of her husband.     PERTINENT HISTORY: Rachel Torres is a 67 year old female with recent history of Left shoulder pain. Referral sent for left shoulder pain and arthritis of left glenohumeral joint. She was treated successfully in past for right shoulder pain and has past medical history of Right shoulder surgery replacement. 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 She reports most limited reaching her arm out and donning bra/clothes. See list attached for PMH.   Back:  Patient's back pain began about a month , slow onset. Radiating down L leg, did not start radiating down leg until more recently. The more she walks the worse it gets. Is getting numbness on the outside of the leg, intermittent.  Worst pain in back 7/10, least amount 0/10, current 0/10.  PAIN:  Are you having pain? Yes: NPRS scale: 0/10 at rest up to 4-5/10 Pain location: Left ant and post shoulder/arm Pain description: ache mostly with sharp pain with movement Aggravating factors: Don bra, lifting with left arm, reaching forward and out to side Relieving factors: ice some, rest, tylenol prn, heat   PRECAUTIONS: None  WEIGHT BEARING RESTRICTIONS: No  FALLS:  Has patient fallen in last 6 months? Yes. 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  PLOF: Independent  PATIENT GOALS:To be able to use my shoulder better and without pain  NEXT MD VISIT: None scheduled  OBJECTIVE:   DIAGNOSTIC FINDINGS:  Quick Dash =63.6  PATIENT SURVEYS:  FOTO 44/predicted goal- 56  COGNITION: Overall cognitive status: Within functional limits for tasks assessed     SENSATION: WFL  POSTURE: Forward shoulders  UPPER EXTREMITY ROM:   Active ROM Right eval Left eval  Shoulder flexion WNL  158 (painful arc)  Shoulder extension WNL  55  Shoulder abduction WNL  138 (painful)  Shoulder adduction WNL    Shoulder internal rotation WNL  75  Shoulder external rotation WNL  68 (painful)  Elbow flexion WNL  WNL   Elbow extension WNL  WNL   Wrist flexion    Wrist extension    Wrist ulnar deviation    Wrist radial deviation    Wrist pronation    Wrist supination    (Blank rows = not tested)  Trunk Flexion 65  Trunk Extension 8  Trunk R SB WFL  Trunk L SB WFL * pain  Trunk R rotation WFL  Trunk L rotation WFL   Hamstring length R: L:    UPPER EXTREMITY MMT:  MMT Right eval Left eval  Shoulder flexion 5 3+  Shoulder extension 5 4  Shoulder abduction 5 3-  Shoulder adduction 5 3+   Shoulder internal rotation 5 3+  Shoulder external rotation 5 3+  Middle trapezius    Lower trapezius    Elbow flexion 5 4  Elbow extension 5 4  Wrist flexion    Wrist extension    Wrist ulnar deviation    Wrist radial deviation    Wrist pronation    Wrist supination  Grip strength (lbs)    (Blank rows = not tested)   Right Left  Hip flexion 4 4  Hip Abduction 4 4  Hip Adduction 4 4  Knee Extension  4 4  Knee Flexion 4 4  DF 4- 4-  PF 4- 4-    SHOULDER SPECIAL TESTS: Impingement tests: Neer impingement test: positive , Hawkins/Kennedy impingement test: positive , and Painful arc test: positive   Rotator cuff assessment: Empty can test: positive  and Full can test: positive  Biceps assessment:  Speed's test: positive   Lumbar special tests: SLR: negative Slump : positive L  Fair: positive Faber: negative Scour: negative Pelvis screening : negative   JOINT MOBILITY TESTING:  Hypomobile with GH- Inferior and A/P and P/A joint glides. L4-5: extreme pain causing spasm with grade II mobilization Hip L AP WFL Repeated extension with mobilization to spine: no increase in pain  PALPATION:  +tenderness along supraspinatus insertion- lateral and posterior deltoid   TODAY'S TREATMENT:                                                                                                                                         DATE:   Non- billable visit- Patient presents with MRI results and wanted to discuss her results and plan. At this time she would like to discontinue her outpatient PT- going to focus on seeking a surgeon and to discuss a plan. She states she is comfortable with plan to discharge at this time and to continue with her HEP for low back.       PATIENT EDUCATION: Education details: PT plan of care; shoulder anatomy; goals Person educated: Patient Education method: Explanation, Demonstration, Tactile cues, Verbal cues, and Handouts Education comprehension: verbalized understanding, returned demonstration, verbal cues required, tactile cues required, and needs further education  HOME EXERCISE PROGRAM: Access Code: [email or web address] URL: [email or web address] Date: 01/27/2023 Prepared by: Sande Brothers  Exercises - Supine Shoulder Flexion Extension AAROM with Dowel  - 1 x daily - 7 x weekly - 3 sets - 10 reps - Supine Shoulder External Rotation with Dowel  - 1 x daily - 7 x weekly - 3 sets - 10 reps - Supine Shoulder Abduction AAROM with Dowel  - 1 x daily - 7 x weekly - 3 sets - 10 reps  Access Code: [email or web address] URL: [email or web address] Date: 02/16/2023 Prepared by: Janna Arch  Exercises - Seated Piriformis Stretch  - 1 x daily - 7 x  weekly - 2 sets - 10 reps - 30 hold - Supine Piriformis Stretch with Foot on Ground  - 1 x daily - 7 x weekly - 2 sets - 10 reps - 30 hold - Prone Press Up On Elbows  - 1 x daily - 7 x weekly - 2 sets - 10 reps - 5 hold - Supine Sciatic Nerve Glide  - 1 x daily - 7 x weekly -  2 sets - 10 reps - 5 hold - Supine Sciatic Nerve Glide  - 1 x daily - 7 x weekly - 2 sets - 10 reps - 5 hold  ASSESSMENT:  CLINICAL IMPRESSION: Non- billable visit- for PT discharge at patient request today. She has 2 complete RC tears and will require surgery. She states she will need time to coordinate the surgery as she is the primary caregiver for her husband and will need to coordinate care for him. Unable to assess goals and patient to continue with her plan to seek surgical intervention for her left shoulder.   OBJECTIVE IMPAIRMENTS: decreased ROM, decreased strength, hypomobility, impaired flexibility, impaired UE functional use, postural dysfunction, and pain.   ACTIVITY LIMITATIONS: carrying, lifting, dressing, reach over head, and caring for others  PARTICIPATION LIMITATIONS: meal prep, cleaning, laundry, driving, shopping, community activity, occupation, and yard work  PERSONAL FACTORS: 3+ comorbidities: OA, Depression, CHF, hx of aortic aneurrysm  are also affecting patient's functional outcome.   REHAB POTENTIAL: Good  CLINICAL DECISION MAKING: Evolving/moderate complexity  EVALUATION COMPLEXITY: Moderate   GOALS: Goals reviewed with patient? Yes  SHORT TERM GOALS: Target date: 03/10/2023  Pt will be independent with HEP in order to improve strength and decrease pain in order to improve pain-free function at home and work.  Baseline: EVAL = No formal HEP In place Goal status: INITIAL   LONG TERM GOALS: Target date: 04/21/2023  Pt will improve FOTO to target score of 59 to display perceived improvements in ability to complete ADL's.  Baseline: EVAL= 44 Goal status: Discontinued - Patient  d/c  2.  Pt will decrease worst pain  in left shoulder as reported on NPRS by at least 3 points in order to demonstrate clinically significant reduction in pain.  Baseline: EVAL= 5/10 Goal status: Discontinued - Patient d/c  3.  Pt will decrease quick DASH score by at least 8% in order to demonstrate clinically significant reduction in disability.  Baseline: EVAL= 63.6 Goal status: Discontinued - Patient d/c  4.  Pt will increase strength of  by at least 1/2 MMT grade in order to demonstrate improvement in strength and function  Baseline: EVAL= 3+ left shoulder flex/abd Goal status:Discontinued - Patient d/c  5.  Patient will report return to functional independence with ADLs including donning bra and reaching overhead without diffiuclty. Baseline: EVAL= Difficulty donning bra- requiring some assistance at times and very limited overhead functional use of Left shoulder Goal status: Discontinued - Patient d/c  6.   Patient will reduce modified Oswestry score to <20 as to demonstrate minimal disability with ADLs including improved sleeping tolerance, walking/sitting tolerance etc for better mobility with ADLs.  Baseline: 64% Goal status: Discontinued - Patient d/c  7.   Patient will report a worst pain of 3/10 on VAS in low back to improve tolerance with ADLs and reduced symptoms with activities.  Baseline: 2/28: 7/10  Goal status: Patient d/c    PLAN:  PT FREQUENCY: 1-2x/week  PT DURATION: 12 weeks  PLANNED INTERVENTIONS: Therapeutic exercises, Therapeutic activity, Patient/Family education, Self Care, Joint mobilization, Dry Needling, Electrical stimulation, Spinal manipulation, Spinal mobilization, Cryotherapy, Moist heat, Taping, and Manual therapy  PLAN FOR NEXT SESSION: Per patient request- discharged from outpatient PT today.   Lewis Moccasin, PT 03/07/2023, 12:29 PM

## 2023-03-07 NOTE — Progress Notes (Signed)
[facility] Counselor/Therapist Progress Note  Patient ID: Rachel Torres, MRN: [id number],    Date: 03/07/2023  Time Spent: 3:00pm-3:50pm     50 minutes   Treatment Type: Individual Therapy  Reported Symptoms: stress  Mental Status Exam: Appearance:  Casual     Behavior: Appropriate  Motor: Normal  Speech/Language:  Normal Rate  Affect: Appropriate  Mood: normal  Thought process: normal  Thought content:   WNL  Sensory/Perceptual disturbances:   WNL  Orientation: oriented to person, place, time/date, and situation  Attention: Good  Concentration: Good  Memory: WNL  Fund of knowledge:  Good  Insight:   Good  Judgment:  Good  Impulse Control: Good   Risk Assessment: Danger to Self:  No Self-injurious Behavior: No Danger to Others: No Duty to Warn:no Physical Aggression / Violence:No  Access to Firearms a concern: No  Gang Involvement:No   Subjective:  [name] present for face-to-face individual therapy via video Webex.  Pt consents to telehealth video session due to COVID 19 pandemic. Location of pt: home Location of therapist: home office.   Pt talked about getting some news she didn't like today.  She found out she needs to have rotator cuff surgery.  Pt is concerned about how she will handle needing care for herself and Rachel Torres.  Addressed pt's concerns and worries.   Pt states she is having "care giver drama".   Her care giver has had to call out due to illness.   This can be frustrating for pt to not have care for Rachel Torres.  Pt talked about caregiving for Rachel Torres.   His cognitive issues are challenging to deal with.   Addressed the challenges.  Worked on problem solving.   Worked on self care strategies.   Provided supportive therapy.  Interventions: Cognitive Behavioral Therapy and Insight-Oriented  Diagnosis: F33.1   Plan of Care: Recommend ongoing therapy.   Pt participated in setting treatment goals.   Pt needs a place to talk about stressors.   Plan to meet every two weeks.    Treatment Plan (Treatment Plan Target Date: 03/23/2023) Client Abilities/Strengths  Pt is bright, engaging, and motivated for therapy.   Client Treatment Preferences  Individual therapy.  Client Statement of Needs  Improve coping skills.  Have a place to talk about stressors.   Symptoms  Depressed or irritable mood. Unresolved grief issues.  Problems Addressed  Unipolar Depression Goals 1. Alleviate depressive symptoms and return to previous level of effective functioning. 2. Appropriately grieve the loss in order to normalize mood and to return to previously adaptive level of functioning. Objective Learn and implement behavioral strategies to overcome depression. Target Date: 2023-03-23 Frequency: Biweekly  Progress: 10 Modality: individual  Related Interventions Engage the client in "behavioral activation," increasing his/her activity level and contact with sources of reward, while identifying processes that inhibit activation.  Use behavioral techniques such as instruction, rehearsal, role-playing, role reversal, as needed, to facilitate activity in the client's daily life; reinforce success. Assist the client in developing skills that increase the likelihood of deriving pleasure from behavioral activation (e.g., assertiveness skills, developing an exercise plan, less internal/more external focus, increased social involvement); reinforce success. Objective Identify important people in life, past and present, and describe the quality, good and poor, of those relationships. Target Date: 2023-03-23 Frequency: Biweekly  Progress: 10 Modality: individual  Related Interventions Conduct Interpersonal Therapy beginning with the assessment of the client's "interpersonal inventory" of important past and present relationships; develop a case formulation linking  depression to grief, interpersonal role disputes, role transitions, and/or interpersonal  deficits). Objective Learn and implement problem-solving and decision-making skills. Target Date: 2023-03-23 Frequency: Biweekly  Progress: 10 Modality: individual  Related Interventions Conduct Problem-Solving Therapy using techniques such as psychoeducation, modeling, and role-playing to teach client problem-solving skills (i.e., defining a problem specifically, generating possible solutions, evaluating the pros and cons of each solution, selecting and implementing a plan of action, evaluating the efficacy of the plan, accepting or revising the plan); role-play application of the problem-solving skill to a real life issue. Encourage in the client the development of a positive problem orientation in which problems and solving them are viewed as a natural part of life and not something to be feared, despaired, or avoided. 3. Develop healthy interpersonal relationships that lead to the alleviation and help prevent the relapse of depression. 4. Develop healthy thinking patterns and beliefs about self, others, and the world that lead to the alleviation and help prevent the relapse of depression. 5. Recognize, accept, and cope with feelings of depression. Diagnosis F33.1  Conditions For Discharge Achievement of treatment goals and objectives   [name], LCSW

## 2023-03-08 ENCOUNTER — Other Ambulatory Visit: Payer: Medicare HMO

## 2023-03-08 ENCOUNTER — Ambulatory Visit
Admission: RE | Admit: 2023-03-08 | Discharge: 2023-03-08 | Disposition: A | Discharge status: Home / Self Care | Payer: Medicare HMO | Source: Ambulatory Visit | Attending: Otolaryngology | Admitting: Otolaryngology

## 2023-03-08 DIAGNOSIS — H9311 Tinnitus, right ear: Secondary | ICD-10-CM

## 2023-03-08 MED ORDER — GADOPICLENOL 0.5 MMOL/ML IV SOLN
10.0000 mL | Freq: Once | INTRAVENOUS | Status: AC | PRN
  Administered 2023-03-08: 10 mL via INTRAVENOUS

## 2023-03-09 ENCOUNTER — Ambulatory Visit: Payer: Medicare HMO

## 2023-03-09 ENCOUNTER — Telehealth: Payer: Self-pay | Admitting: Cardiovascular Disease

## 2023-03-09 DIAGNOSIS — R0602 Shortness of breath: Secondary | ICD-10-CM | POA: Diagnosis not present

## 2023-03-09 DIAGNOSIS — R9431 Abnormal electrocardiogram [ECG] [EKG]: Secondary | ICD-10-CM | POA: Diagnosis not present

## 2023-03-09 DIAGNOSIS — E785 Hyperlipidemia, unspecified: Secondary | ICD-10-CM | POA: Diagnosis not present

## 2023-03-09 DIAGNOSIS — I480 Paroxysmal atrial fibrillation: Secondary | ICD-10-CM | POA: Diagnosis not present

## 2023-03-09 DIAGNOSIS — R0789 Other chest pain: Secondary | ICD-10-CM | POA: Diagnosis not present

## 2023-03-09 DIAGNOSIS — R079 Chest pain, unspecified: Secondary | ICD-10-CM | POA: Diagnosis not present

## 2023-03-09 DIAGNOSIS — I4891 Unspecified atrial fibrillation: Secondary | ICD-10-CM | POA: Diagnosis not present

## 2023-03-09 DIAGNOSIS — R002 Palpitations: Secondary | ICD-10-CM | POA: Diagnosis not present

## 2023-03-09 DIAGNOSIS — I5022 Chronic systolic (congestive) heart failure: Secondary | ICD-10-CM | POA: Diagnosis not present

## 2023-03-09 DIAGNOSIS — I2510 Atherosclerotic heart disease of native coronary artery without angina pectoris: Secondary | ICD-10-CM | POA: Diagnosis not present

## 2023-03-09 DIAGNOSIS — K219 Gastro-esophageal reflux disease without esophagitis: Secondary | ICD-10-CM | POA: Diagnosis not present

## 2023-03-09 DIAGNOSIS — I110 Hypertensive heart disease with heart failure: Secondary | ICD-10-CM | POA: Diagnosis not present

## 2023-03-09 DIAGNOSIS — F1090 Alcohol use, unspecified, uncomplicated: Secondary | ICD-10-CM | POA: Diagnosis not present

## 2023-03-09 DIAGNOSIS — Z87891 Personal history of nicotine dependence: Secondary | ICD-10-CM | POA: Diagnosis not present

## 2023-03-09 DIAGNOSIS — G4733 Obstructive sleep apnea (adult) (pediatric): Secondary | ICD-10-CM | POA: Diagnosis not present

## 2023-03-09 NOTE — Telephone Encounter (Signed)
Called patient and she reports elevated heart rates from 112-135 with some chest discomfort. Oxygen level was normal at 93%. Advised that she should proceed to the local emergency department for further evaluation based on the chest discomfort and elevated heart rates. She was agreeable with plan and had no further questions at this time.

## 2023-03-09 NOTE — Telephone Encounter (Signed)
Patient currently at [facility] ED. No further needs.

## 2023-03-09 NOTE — Telephone Encounter (Signed)
STAT if HR is under 50 or over 120 (normal HR is 60-100 beats per minute)  What is your heart rate? 125  Do you have a log of your heart rate readings (document readings)? 130, 128, she states over the past 12 hours it ranged from 112-135.   Do you have any other symptoms? States her oxygen level is 93%.  She states she had an brain MRI done yesterday.  She is not sure if the contrast could have anything to do with it. States her chest feels uncomfortable and feels tired.

## 2023-03-09 NOTE — Telephone Encounter (Signed)
[facility] ED Charge nurse notified that patient is en route to the ED via private vehicle.

## 2023-03-09 NOTE — Telephone Encounter (Signed)
Pt wanted to make the office aware that she decided not to go to [facility] ER, but to [facility] in [place] instead.

## 2023-03-10 ENCOUNTER — Telehealth: Payer: Self-pay | Admitting: Cardiovascular Disease

## 2023-03-10 NOTE — Telephone Encounter (Signed)
Pt would like a callback regarding the Eliquis she was prescribed while in the ED because of the warning label saying not to take if you have a artificial heart valve. They want her to take it until her AFIB appt on Monday because of possible blood clots. Pt is concerned. Please advise.

## 2023-03-11 ENCOUNTER — Telehealth: Payer: Self-pay | Admitting: Pharmacist

## 2023-03-11 NOTE — Telephone Encounter (Signed)
Contacted patient to discuss recent ED visit for Afib. She was prescribed Eliquis and asked to follow up with Afib clinic at [facility] on Tues 3/26. However pt was concerned because she read in Eliquis paperwork not to take it with mechanical valve, which she does have an aortic valve replacement. Pt reports she was able to reach provider at [facility] ED and they advised her to hold off on starting Eliquis until the appt at the Afib clinic on Tuesday. Verified with patient that she is taking aspirin 81 mg daily. This is reasonable plan since she has close follow up. No further action needed.

## 2023-03-13 ENCOUNTER — Other Ambulatory Visit: Payer: Self-pay | Admitting: Family Medicine

## 2023-03-13 DIAGNOSIS — F418 Other specified anxiety disorders: Secondary | ICD-10-CM

## 2023-03-14 ENCOUNTER — Ambulatory Visit: Payer: Medicare HMO

## 2023-03-15 ENCOUNTER — Encounter (INDEPENDENT_AMBULATORY_CARE_PROVIDER_SITE_OTHER): Payer: Self-pay | Admitting: Family Medicine

## 2023-03-15 ENCOUNTER — Ambulatory Visit (INDEPENDENT_AMBULATORY_CARE_PROVIDER_SITE_OTHER): Payer: Medicare HMO | Admitting: Psychology

## 2023-03-15 DIAGNOSIS — R9431 Abnormal electrocardiogram [ECG] [EKG]: Secondary | ICD-10-CM | POA: Diagnosis not present

## 2023-03-15 DIAGNOSIS — K219 Gastro-esophageal reflux disease without esophagitis: Secondary | ICD-10-CM | POA: Diagnosis not present

## 2023-03-15 DIAGNOSIS — Z6835 Body mass index (BMI) 35.0-35.9, adult: Secondary | ICD-10-CM | POA: Diagnosis not present

## 2023-03-15 DIAGNOSIS — Z8679 Personal history of other diseases of the circulatory system: Secondary | ICD-10-CM | POA: Diagnosis not present

## 2023-03-15 DIAGNOSIS — G43109 Migraine with aura, not intractable, without status migrainosus: Secondary | ICD-10-CM | POA: Diagnosis not present

## 2023-03-15 DIAGNOSIS — E669 Obesity, unspecified: Secondary | ICD-10-CM | POA: Diagnosis not present

## 2023-03-15 DIAGNOSIS — F331 Major depressive disorder, recurrent, moderate: Secondary | ICD-10-CM | POA: Diagnosis not present

## 2023-03-15 DIAGNOSIS — G4733 Obstructive sleep apnea (adult) (pediatric): Secondary | ICD-10-CM | POA: Diagnosis not present

## 2023-03-15 DIAGNOSIS — M810 Age-related osteoporosis without current pathological fracture: Secondary | ICD-10-CM | POA: Diagnosis not present

## 2023-03-15 DIAGNOSIS — I110 Hypertensive heart disease with heart failure: Secondary | ICD-10-CM | POA: Diagnosis not present

## 2023-03-15 DIAGNOSIS — I4819 Other persistent atrial fibrillation: Secondary | ICD-10-CM | POA: Diagnosis not present

## 2023-03-15 DIAGNOSIS — I5042 Chronic combined systolic (congestive) and diastolic (congestive) heart failure: Secondary | ICD-10-CM | POA: Diagnosis not present

## 2023-03-15 NOTE — Progress Notes (Signed)
[facility] Counselor/Therapist Progress Note  Patient ID: SHARNAY DAIS, MRN: [id number],    Date: 03/15/2023  Time Spent: 10:00am-10:50am     50 minutes   Treatment Type: Individual Therapy  Reported Symptoms: stress  Mental Status Exam: Appearance:  Casual     Behavior: Appropriate  Motor: Normal  Speech/Language:  Normal Rate  Affect: Appropriate  Mood: normal  Thought process: normal  Thought content:   WNL  Sensory/Perceptual disturbances:   WNL  Orientation: oriented to person, place, time/date, and situation  Attention: Good  Concentration: Good  Memory: WNL  Fund of knowledge:  Good  Insight:   Good  Judgment:  Good  Impulse Control: Good   Risk Assessment: Danger to Self:  No Self-injurious Behavior: No Danger to Others: No Duty to Warn:no Physical Aggression / Violence:No  Access to Firearms a concern: No  Gang Involvement:No   Subjective:  [name] present for face-to-face individual therapy via video Webex.  Pt consents to telehealth video session due to COVID 19 pandemic. Location of pt: home Location of therapist: home office.   Pt talked about her health.  She was in the ER last weekend bc of having affib.   She is seeing a cardiologist today.  Addressed pt's health concerns.  Pt states she is scared.  Helped her process her feelings and fears.  Pt also has a shoulder tear that she needs to have surgery for.  Pt talked about the home renovations she is doing.  The renovations are going well.   Pt talked about caregiving for [name].   His cognitive issues are challenging to deal with.   Addressed the challenges.  Worked on problem solving.   Worked on self care strategies.   Provided supportive therapy.  Interventions: Cognitive Behavioral Therapy and Insight-Oriented  Diagnosis: F33.1   Plan of Care: Recommend ongoing therapy.   Pt participated in setting treatment goals.   Pt needs a place to talk about stressors.  Plan to meet  every two weeks.    Treatment Plan (Treatment Plan Target Date: 03/23/2023) Client Abilities/Strengths  Pt is bright, engaging, and motivated for therapy.   Client Treatment Preferences  Individual therapy.  Client Statement of Needs  Improve coping skills.  Have a place to talk about stressors.   Symptoms  Depressed or irritable mood. Unresolved grief issues.  Problems Addressed  Unipolar Depression Goals 1. Alleviate depressive symptoms and return to previous level of effective functioning. 2. Appropriately grieve the loss in order to normalize mood and to return to previously adaptive level of functioning. Objective Learn and implement behavioral strategies to overcome depression. Target Date: 2023-03-23 Frequency: Biweekly  Progress: 10 Modality: individual  Related Interventions Engage the client in "behavioral activation," increasing his/her activity level and contact with sources of reward, while identifying processes that inhibit activation.  Use behavioral techniques such as instruction, rehearsal, role-playing, role reversal, as needed, to facilitate activity in the client's daily life; reinforce success. Assist the client in developing skills that increase the likelihood of deriving pleasure from behavioral activation (e.g., assertiveness skills, developing an exercise plan, less internal/more external focus, increased social involvement); reinforce success. Objective Identify important people in life, past and present, and describe the quality, good and poor, of those relationships. Target Date: 2023-03-23 Frequency: Biweekly  Progress: 10 Modality: individual  Related Interventions Conduct Interpersonal Therapy beginning with the assessment of the client's "interpersonal inventory" of important past and present relationships; develop a case formulation linking depression to grief, interpersonal  role disputes, role transitions, and/or interpersonal deficits). Objective Learn  and implement problem-solving and decision-making skills. Target Date: 2023-03-23 Frequency: Biweekly  Progress: 10 Modality: individual  Related Interventions Conduct Problem-Solving Therapy using techniques such as psychoeducation, modeling, and role-playing to teach client problem-solving skills (i.e., defining a problem specifically, generating possible solutions, evaluating the pros and cons of each solution, selecting and implementing a plan of action, evaluating the efficacy of the plan, accepting or revising the plan); role-play application of the problem-solving skill to a real life issue. Encourage in the client the development of a positive problem orientation in which problems and solving them are viewed as a natural part of life and not something to be feared, despaired, or avoided. 3. Develop healthy interpersonal relationships that lead to the alleviation and help prevent the relapse of depression. 4. Develop healthy thinking patterns and beliefs about self, others, and the world that lead to the alleviation and help prevent the relapse of depression. 5. Recognize, accept, and cope with feelings of depression. Diagnosis F33.1  Conditions For Discharge Achievement of treatment goals and objectives   [name], LCSW

## 2023-03-16 ENCOUNTER — Ambulatory Visit: Payer: Medicare HMO

## 2023-03-16 ENCOUNTER — Other Ambulatory Visit (INDEPENDENT_AMBULATORY_CARE_PROVIDER_SITE_OTHER): Payer: Self-pay | Admitting: Family Medicine

## 2023-03-16 DIAGNOSIS — F3289 Other specified depressive episodes: Secondary | ICD-10-CM

## 2023-03-17 ENCOUNTER — Telehealth (INDEPENDENT_AMBULATORY_CARE_PROVIDER_SITE_OTHER): Payer: Medicare HMO | Admitting: Family Medicine

## 2023-03-17 DIAGNOSIS — J4520 Mild intermittent asthma, uncomplicated: Secondary | ICD-10-CM | POA: Diagnosis not present

## 2023-03-17 DIAGNOSIS — I4819 Other persistent atrial fibrillation: Secondary | ICD-10-CM | POA: Diagnosis not present

## 2023-03-17 DIAGNOSIS — F411 Generalized anxiety disorder: Secondary | ICD-10-CM | POA: Diagnosis not present

## 2023-03-17 DIAGNOSIS — I4891 Unspecified atrial fibrillation: Secondary | ICD-10-CM | POA: Diagnosis not present

## 2023-03-17 DIAGNOSIS — I5042 Chronic combined systolic (congestive) and diastolic (congestive) heart failure: Secondary | ICD-10-CM | POA: Diagnosis not present

## 2023-03-17 DIAGNOSIS — G4733 Obstructive sleep apnea (adult) (pediatric): Secondary | ICD-10-CM | POA: Diagnosis not present

## 2023-03-17 DIAGNOSIS — M069 Rheumatoid arthritis, unspecified: Secondary | ICD-10-CM | POA: Diagnosis not present

## 2023-03-17 DIAGNOSIS — R9431 Abnormal electrocardiogram [ECG] [EKG]: Secondary | ICD-10-CM | POA: Diagnosis not present

## 2023-03-17 DIAGNOSIS — I110 Hypertensive heart disease with heart failure: Secondary | ICD-10-CM | POA: Diagnosis not present

## 2023-03-17 DIAGNOSIS — E669 Obesity, unspecified: Secondary | ICD-10-CM | POA: Diagnosis not present

## 2023-03-17 DIAGNOSIS — I447 Left bundle-branch block, unspecified: Secondary | ICD-10-CM | POA: Diagnosis not present

## 2023-03-18 ENCOUNTER — Ambulatory Visit: Payer: Medicare HMO | Attending: Cardiology | Admitting: Cardiology

## 2023-03-18 ENCOUNTER — Ambulatory Visit
Admission: EM | Admit: 2023-03-18 | Discharge: 2023-03-18 | Disposition: A | Discharge status: Home / Self Care | Payer: Medicare HMO

## 2023-03-18 ENCOUNTER — Encounter: Payer: Self-pay | Admitting: Cardiology

## 2023-03-18 VITALS — BP 116/66 | HR 72 | Ht 65.0 in | Wt 217.2 lb

## 2023-03-18 DIAGNOSIS — I10 Essential (primary) hypertension: Secondary | ICD-10-CM | POA: Diagnosis not present

## 2023-03-18 DIAGNOSIS — E7800 Pure hypercholesterolemia, unspecified: Secondary | ICD-10-CM | POA: Diagnosis not present

## 2023-03-18 DIAGNOSIS — I7101 Dissection of ascending aorta: Secondary | ICD-10-CM | POA: Diagnosis not present

## 2023-03-18 DIAGNOSIS — Q796 Ehlers-Danlos syndrome, unspecified: Secondary | ICD-10-CM

## 2023-03-18 DIAGNOSIS — J029 Acute pharyngitis, unspecified: Secondary | ICD-10-CM

## 2023-03-18 DIAGNOSIS — I25118 Atherosclerotic heart disease of native coronary artery with other forms of angina pectoris: Secondary | ICD-10-CM | POA: Diagnosis not present

## 2023-03-18 DIAGNOSIS — I480 Paroxysmal atrial fibrillation: Secondary | ICD-10-CM | POA: Diagnosis not present

## 2023-03-18 DIAGNOSIS — I5022 Chronic systolic (congestive) heart failure: Secondary | ICD-10-CM

## 2023-03-18 MED ORDER — CARVEDILOL 12.5 MG PO TABS: 12.5000 mg | ORAL_TABLET | Freq: Two times a day (BID) | ORAL | 3 refills | Status: DC

## 2023-03-18 NOTE — Progress Notes (Signed)
Cardiology Office Note:   Date:  03/18/2023  ID:  [place] Schnitker, DOB 10-06-1956, MRN [id number]  History of Present Illness:   Rachel Torres is a 67 y.o. female with past medical history of aortic type a dissection in 08/2010, aortic valve replacement, CAD status post CABG, HFrEF with recovered ejection fraction, obstructive sleep apnea, rheumatoid arthritis,chronic back pain, chronic shortness of breath, hypertension, hyperlipidemia, morbid syndrome, who is here today for follow-up on her coronary artery disease.  Last seen in clinic 01/25/2023 at that time she reported that her shortness of breath and dyspnea on exertion was attributed to weight gain and deconditioning with her exercise.  She states that she had chest pain episodes resolved with GI cocktail.  There were no changes made to her medication and no further testing that was ordered.  Patient was evaluated in the emergency [facility] 03/09/2023 for evaluation of chest pain and palpitations.  She was found to be in atrial fibrillation.  She was scheduled for follow-up in A-fib clinic.  Her carvedilol been increased to 25 mg twice daily and she was advised at that point able to stop with her to stop the apixaban or continue based on the plan of action.  So she was continued on apixaban for stroke prophylaxis.  Her appointment with outpatient cardiology was scheduled for March 26.  She followed up with A-fib on 03/15/2023 at that time she was scheduled for TEE/DCCV.  She was underwent successful TEE/DCCV 03/17/2023.  She was continued on carvedilol, apixaban 5 mg twice daily with the discontinuation of aspirin for CHA2DS2-VASc score of at least 5.  She returns to clinic today with complaints of fatigue with being in atrial fibrillation over the last week symptoms and was started on antibiotic therapy for.  She is also stating that her arthritis has been bothering her worse and she has not had follow-up with rheumatology in quite  some time.  Since having a cardioversion completed yesterday she states that she feels a little bit better but had a remote ablation completed she is approximately 20-25 years prior and is requesting to see EP Dr. Quentin Ore to determine if she does not stay in atrial fibrillation but is a possibility she can undergo repeat ablation.  ROS: Endorses fatigue, endorses sore throat, endorses swelling in her finger joints, peripheral edema, and occasional abdominal bloating.  The remainder of the 10 point review of systems is negative  Studies Reviewed:    EKG: Sinus rhythm with a rate of 72, with LVH, left axis deviation  TEE done at outside hospital 03/17/23  1. There is no thrombus seen in the left atrium.    2. No evidence for left atrial appendage thrombus.    3. There is no thrombus seen in the right atrium.    4. The [employer] prosthetic aortic valve is well seated.   Successful DCCV patient shocked into sinus rhythm.  Risk Assessment/Calculations:    CHA2DS2-VASc Score = 5   This indicates a 7.2% annual risk of stroke. The patient's score is based upon: CHF History: 1 HTN History: 1 Diabetes History: 0 Stroke History: 0 Vascular Disease History: 1 Age Score: 1 Gender Score: 1             Physical Exam:   VS:  BP 116/66 (BP Location: Left Arm, Patient Position: Sitting, Cuff Size: Normal)   Pulse 72   Ht 5\' 5"  (1.651 m)   Wt 217 lb 3.2 oz (98.5 kg)  SpO2 97%   BMI 36.14 kg/m    Wt Readings from Last 3 Encounters:  03/18/23 217 lb 3.2 oz (98.5 kg)  02/17/23 205 lb (93 kg)  02/14/23 210 lb (95.3 kg)     GEN: Well nourished, well developed in no acute distress NECK: No JVD; No carotid bruits CARDIAC: RRR, I/VI systolic murmur, without rubs, gallops RESPIRATORY:  Clear to auscultation without rales, wheezing or rhonchi  ABDOMEN: Soft, non-tender, non-distended EXTREMITIES:  Trace pretibial edema; No deformity   ASSESSMENT AND PLAN:   Paroxysmal atrial  fibrillation with recent TEE cardioversion completed on 03/17/2023.  EKG today reveals patient is maintaining sinus rhythm.  She has had no bleeding or bruising since being on apixaban 5 mg twice daily for CHA2DS2-VASc of at least 5.  She was discontinued from aspirin after recent visit to [facility].  Her carvedilol dosing was decreased to 12.5 mg twice daily today.  She was continued on aspirin 81 mg daily for longstanding history of coronary artery disease status post CABG with AVR.  She is also scheduled referral to EP Dr. Quentin Ore with his ongoing concern for palpitations and atrial fibrillation that required repeat cardioversion.  Patient had a previous ablation several years prior and is requesting a follow-up with EP.  Then if she has recurrent atrial fibrillation she would like to know what her options are as far as repeat ablation procedure.  Patient has also been advised to follow-up  on her sleep apnea and stress can be triggered for recurrent atrial fibrillation.   Coronary artery disease status post CABG and AVR with patient is asymptomatic without any complaints of chest discomfort or decompensation.  EKG today shows no ischemic changes.  She is continued on aspirin, atorvastatin, Zetia, Imdur and Nitrostat as needed.  HFimpEF with last LVEF 45-50%.  She denies any recurrent shortness of breath today.  She has been continued on carvedilol, furosemide she has been taking 40 mg daily, lisinopril 2.5 mg daily.    Hypertension with blood pressure today 116/66.  Blood pressure remained stable.  She has been continued on current medication regimen without changes or refills needed to her medications today.  Pure hypercholesterolemia last LDL 68 on 02/17/2023.  Currently remains at goal she has been continued on atorvastatin and Zetia.  History of dissection of the thoracic abdominal aorta which continues to be followed by vascular in [place].  Last CT angio of the chest and pelvis was 07/2022 with stable  findings.  She was requesting a referral to rheumatology today for Ehlers-Danlos syndrome and is requesting evaluation.  Disposition patient return to clinic to see MD/APP in 3 months or sooner if needed.  Referrals have been placed to rheumatology and EP per patient's request.      Signed, Rachel Duley, NP

## 2023-03-18 NOTE — ED Triage Notes (Signed)
Sore throat, did have a cardiac procedure with scope that went down throat down yesterday, and husband caregiver was positive for strep today. Is taking Augmentin currently for ear infection.

## 2023-03-18 NOTE — Medical Student Note (Signed)
[facility] Systems analyst Note For educational purposes for Medical, PA and NP students only and not part of the legal medical record.   CSN: [id number] Arrival date & time: 03/18/23  1015      History   Chief Complaint Chief Complaint  Patient presents with   Sore Throat    HPI Rachel Torres is a 67 y.o. female.  Patient presents with a 1 day history of sore throat. She reports that she had a cardiac procedure (cardioversion) yesterday wherein she was intubated. She is also on Augmentin for an ear infection (day 4). She wants to ensure that she does not have strep throat. She reports having a bit of nasal congestion and fatigue but no other sick symptoms.    Sore Throat    Past Medical History:  Diagnosis Date   AAA (abdominal aortic aneurysm) ([place])    a. Chronic w/o evidence of aneurysmal dil on CTA 01/2016.   Alcohol abuse    Allergy    Anemia    Anxiety    Asthma    CAD (coronary artery disease)    a. 08/2010 s/p CABG x 1 (VG->RCA) @ time of Ao dissection repair; b. 01/2016 Lexiscan MV: mid antsept/apical defect w/ ? peri-infarct ischemia-->likely attenuation-->Med Rx.   Chewing difficulty    Chronic combined systolic (congestive) and diastolic (congestive) heart failure ([place])    a. 2012 EF 30-35%; b. 04/2015 EF 40-45%; c. 01/2016 Echo: Ef 55-65%, nl AoV bioprosthesis, nl RV, nl PASP.   Constipation    Depression    Edema of both lower extremities    Gallbladder sludge    GERD (gastroesophageal reflux disease)    Heart failure (HCC)    Heart valve problem    History of Bicuspid Aortic Valve    a. 08/2010 s/p AVR @ time of Ao dissection repair; b. 01/2016 Echo: Ef 55-65%, nl AoV bioprosthesis, nl RV, nl PASP.   Hx of repair of dissecting thoracic aortic aneurysm, [place] type A    a. 08/2010 s/p repair with AVR and VG->RCA; b. 01/2016 CTA: stable appearance of Asc Thoracic Aortic graft. Opacification of flase lumen of chronic abd Ao dissection w/  retrograde flow through lumbar arteries. No aneurysmal dil of flase lumen.   Hyperlipidemia    Hypertension    Hypertensive heart disease    IBS (irritable bowel syndrome)    Internal hemorrhoids    Kidney problem    Marfan syndrome    pt denies   Migraine    Obstructive sleep apnea    Osteoarthritis    Osteoporosis    Palpitations    Pneumonia    RA (rheumatoid arthritis) ([place])    a. Followed by Dr. [name]    Patient Active Problem List   Diagnosis Date Noted   BMI 34.0-34.9,adult 02/17/2023   Obesity, Beginning BMI 35.45 02/17/2023   Tinnitus 01/14/2023   Change in vision 01/14/2023   Colon cancer screening 01/14/2023   Personal history of fall 01/14/2023   Problem related to unspecified psychosocial circumstances 12/28/2022   Person encountering health services to consult on behalf of another person 12/28/2022   Palpitation 12/10/2022   Fatigue 10/26/2022   B12 deficiency 10/26/2022   Right shoulder pain 09/15/2022   Depression 09/14/2022   Class 2 severe obesity with serious comorbidity and body mass index (BMI) of 35.0 to 35.9 in adult [facility]) 09/14/2022   Other constipation 05/12/2022   Medicare annual wellness visit, initial 07/17/2021   TMJ (dislocation  of temporomandibular joint) 07/17/2021   Encounter for screening mammogram for breast cancer 06/05/2021   Diarrhea 04/11/2020   Epigastric pain 02/19/2020   Dark stools 02/19/2020   History of atrial fibrillation 02/05/2019   Shortness of breath 01/27/2019   Irregular surface of cornea 12/25/2018   HPV (human papilloma virus) infection 12/01/2016   Screening mammogram, encounter for 11/29/2016   Estrogen deficiency 11/29/2016   Encounter for routine gynecological examination 11/29/2016   Grade III hemorrhoids 10/06/2016   Tachycardia 08/14/2016   Hemorrhoids 08/13/2016   Atypical migraine 08/03/2016   CAD (coronary artery disease)    AAA (abdominal aortic aneurysm) (HCC)    Chronic combined systolic  (congestive) and diastolic (congestive) heart failure ([place])    Prediabetes 02/11/2016   Generalized anxiety disorder 12/08/2015   Panic attacks 07/28/2015   Chest pain 07/09/2013   Sensorineural hearing loss 03/27/2013   Vitamin D deficiency 07/03/2012   H/O aortic dissection 05/12/2012   Routine general medical examination at a health care facility 09/20/2011   Atypical chest pain 09/15/2011   S/P AVR (aortic valve replacement) 01/07/2011   CORONARY ARTERY BYPASS GRAFT, HX OF 01/07/2011   Arthritis 12/20/2010   Back pain 12/15/2010   H/O dissecting abdominal aortic aneurysm repair 08/24/2010   IRRITABLE BOWEL SYNDROME 09/09/2009   Obstructive sleep apnea 08/04/2009   External hemorrhoid 06/26/2009   Osteoporosis 01/09/2009   Hyperlipidemia 07/26/2007   Depression with anxiety 07/26/2007   Essential hypertension 07/26/2007   Allergic rhinitis 07/26/2007   Asthma 07/26/2007   GERD 07/26/2007   Osteoarthritis 07/26/2007   Ocular migraine 07/26/2007   Mild intermittent asthma without complication [id number]    Past Surgical History:  Procedure Laterality Date   ABDOMINAL AORTIC ANEURYSM REPAIR     CARDIAC CATHETERIZATION  2001   [place]     COLONOSCOPY     ESOPHAGOGASTRODUODENOSCOPY (EGD) WITH PROPOFOL N/A 05/08/2020   Procedure: ESOPHAGOGASTRODUODENOSCOPY (EGD) WITH PROPOFOL;  Surgeon: Virgel Manifold, MD;  Location: [facility] ENDOSCOPY;  Service: Endoscopy;  Laterality: N/A;   FRACTURE SURGERY Right    shoulder replacement   HEMORRHOID BANDING     ORIF DISTAL RADIUS FRACTURE Left    UPPER GASTROINTESTINAL ENDOSCOPY      OB History     Gravida  2   Para      Term      Preterm      AB      Living         SAB      IAB      Ectopic      Multiple      Live Births               Home Medications    Prior to Admission medications   Medication Sig Start Date End Date Taking? Authorizing Provider   acetaminophen (TYLENOL) 500 MG tablet Take 500 mg by mouth every 6 (six) hours as needed.   Yes [provider]  albuterol (VENTOLIN HFA) 108 (90 Base) MCG/ACT inhaler Inhale 1-2 puffs into the lungs every 6 (six) hours as needed for wheezing or shortness of breath. 07/17/21  Yes Tower, Wynelle Fanny, MD  ALPRAZolam (XANAX) 0.5 MG tablet Take 1 tablet (0.5 mg total) by mouth daily as needed for anxiety. 01/17/23  Yes Tower, Wynelle Fanny, MD  amoxicillin-clavulanate (AUGMENTIN) 875-125 MG tablet Take 1 tablet by mouth 2 (two) times daily. 03/10/23  Yes [provider]  atorvastatin (LIPITOR)  20 MG tablet Take 20 mg by mouth. Four days per week   Yes [provider]  carvedilol (COREG) 12.5 MG tablet Take 1 tablet (12.5 mg total) by mouth 2 (two) times daily. 03/18/23 06/16/23 Yes Hammock, Sheri, NP  Cholecalciferol (VITAMIN D3) 50 MCG (2000 UT) CAPS Take 2,000 capsules by mouth. FOUR TIMES A WEEK   Yes [provider]  cyclobenzaprine (FLEXERIL) 10 MG tablet Take 0.5-1 tablets (5-10 mg total) by mouth 3 (three) times daily as needed. For headache 03/17/22  Yes Tower, Wynelle Fanny, MD  ELIQUIS 5 MG TABS tablet Take 5 mg by mouth 2 (two) times daily.   Yes [provider]  ezetimibe (ZETIA) 10 MG tablet Take 1 tablet (10 mg total) by mouth daily. 09/21/22  Yes Hammock, Sheri, NP  fluticasone (FLONASE) 50 MCG/ACT nasal spray Place into both nostrils. 09/22/22  Yes [provider]  furosemide (LASIX) 20 MG tablet Take 40 mg (2 tablets) one day and the next day 20 mg, alternating daily Patient taking differently: Take 40 mg by mouth 2 (two) times daily. Take 40 mg (2 tablets) one day and the next day 20 mg, alternating daily 01/25/23  Yes Gollan, Kathlene November, MD  hydrOXYzine (ATARAX) 25 MG tablet Take 25 mg by mouth 3 (three) times daily. 01/05/23  Yes [provider]  isosorbide mononitrate (IMDUR) 30 MG 24 hr tablet Take 0.5 tablets (15 mg total) by mouth daily. 10/25/22   Yes Hammock, Barbera Setters, NP  ketoconazole (NIZORAL) 2 % cream Apply to rash on the abdomen area 7 days a week prn 01/27/23  Yes Ralene Bathe, MD  ketoconazole (NIZORAL) 2 % shampoo apply three times per week, massage into scalp and leave in for 10 minutes before rinsing out 01/27/23  Yes Ralene Bathe, MD  lisinopril (ZESTRIL) 5 MG tablet Take 2.5 mg by mouth daily.   Yes [provider]  magnesium gluconate (MAGONATE) 500 MG tablet Take 500 mg by mouth at bedtime.   Yes [provider]  Melatonin 5 MG SUBL Place under the tongue at bedtime as needed.   Yes [provider]  mometasone (ELOCON) 0.1 % lotion Apply to scalp 5 nights week prn itch 01/27/23  Yes Ralene Bathe, MD  omeprazole (PRILOSEC) 40 MG capsule TAKE 1 CAPSULE EVERY DAY 12/06/22  Yes Tower, Marne A, MD  polyethylene glycol powder (GLYCOLAX/MIRALAX) 17 GM/SCOOP powder Take 17 g by mouth as needed. 06/24/21  Yes Beasley, Caren D, MD  Potassium Aminobenzoate 500 MG CAPS Take 500 mg by mouth 2 (two) times daily.   Yes [provider]  potassium chloride (KLOR-CON M) 10 MEQ tablet Take 10 mEq by mouth 2 (two) times daily.   Yes [provider]  promethazine (PHENERGAN) 25 MG tablet Take 0.5 tablets (12.5 mg total) by mouth every 8 (eight) hours as needed for nausea. 03/17/22  Yes Tower, Wynelle Fanny, MD  Simethicone (MYLANTA GAS PO) Take by mouth 3 times/day as needed-between meals & bedtime.   Yes [provider]  TART CHERRY PO Take 1 capsule by mouth 2 (two) times daily.   Yes [provider]  topiramate (TOPAMAX) 50 MG tablet Take 1 tablet (50 mg total) by mouth at bedtime. 02/17/23  Yes Leafy Ro, Caren D, MD  venlafaxine XR (EFFEXOR-XR) 150 MG 24 hr capsule TAKE 1 CAPSULE EVERY DAY WITH BREAKFAST 06/02/22  Yes Tower, Marne A, MD  venlafaxine XR (EFFEXOR-XR) 37.5 MG 24 hr capsule TAKE 1 CAPSULE (37.5  MG TOTAL) BY MOUTH DAILY WITH BREAKFAST. 05/24/22  Yes Tower, Wynelle Fanny, MD  aspirin  EC 81 MG tablet Take 81 mg by mouth at bedtime.    [provider]  clindamycin (CLEOCIN) 300 MG capsule Take 300 mg by mouth 3 (three) times daily. 01/10/23   [provider]  hydrocortisone 2.5 % cream Apply to rash on the abdomen 5 nights a week prn irritation 01/27/23   Ralene Bathe, MD  nitroGLYCERIN (NITROSTAT) 0.4 MG SL tablet Place 1 tablet (0.4 mg total) under the tongue every 5 (five) minutes as needed for chest pain. 06/07/22   Minna Merritts, MD    Family History Family History  Problem Relation Age of Onset   Hypertension Mother    Depression Mother    Osteoporosis Mother    Stroke Mother    Irritable bowel syndrome Mother    Asthma Mother    Heart disease Mother    Thyroid disease Mother    Anxiety disorder Mother    Other Father 63   Arthritis Sister    Diabetes Sister    Heart disease Sister    Asthma Sister    Migraines Sister    Diabetes Sister    Nephrolithiasis Sister    Osteoporosis Sister    Arthritis Sister    Asthma Sister    Migraines Sister    Diabetes Brother    Other Brother        prediabeties   Asthma Brother    Migraines Brother    Stroke Maternal Grandmother    Colon cancer Maternal Grandmother    Lymphoma Maternal Grandmother    Migraines Maternal Grandmother    Lung cancer Maternal Grandfather    Melanoma Maternal Grandfather    Aneurysm Paternal Grandmother        brain   Diabetes Paternal Grandmother    Arthritis Paternal Grandmother    Arthritis Paternal Grandfather    Diabetes Paternal Grandfather     Social History Social History   Tobacco Use   Smoking status: Former    Packs/day: 0.75    Years: 1.50    Additional pack years: 0.00    Total pack years: 1.13    Types: Cigarettes    Quit date: 12/20/1978    Years since quitting: 44.2   Smokeless tobacco: Never  Vaping Use   Vaping Use: Never used  Substance Use Topics   Alcohol use: Yes    Comment: 2-3 glasses of red wine per month   Drug use:  No     Allergies   Fosamax [alendronate sodium], Ginzing [ginseng], Hydrocod poli-chlorphe poli er, Hydrocodone-acetaminophen, Imitrex [sumatriptan], Oxycodone, Peanut-containing drug products, Tramadol, Hydrocodone-guaifenesin, Prednisone, and Tape   Review of Systems Review of Systems  Constitutional: Negative.   HENT:  Positive for congestion and sore throat. Negative for ear discharge, ear pain, facial swelling and hearing loss.   All other systems reviewed and are negative.    Physical Exam Updated Vital Signs BP 124/73 (BP Location: Left Arm)   Pulse 69   Temp 98.1 F (36.7 C) (Oral)   Resp 18   SpO2 96%   Physical Exam Vitals and nursing note reviewed.  Constitutional:      Appearance: She is well-developed.  HENT:     Head: Normocephalic.     Right Ear: Tympanic membrane and ear canal normal. Tympanic membrane is not erythematous.     Left Ear: Tympanic membrane and ear canal normal. Tympanic membrane is not erythematous.  Nose: Rhinorrhea present. No congestion.     Mouth/Throat:     Mouth: Mucous membranes are moist.     Pharynx: Posterior oropharyngeal erythema present. No pharyngeal swelling, oropharyngeal exudate or uvula swelling.     Tonsils: No tonsillar exudate or tonsillar abscesses.  Cardiovascular:     Rate and Rhythm: Normal rate and regular rhythm.  Pulmonary:     Effort: Pulmonary effort is normal.     Breath sounds: Normal breath sounds.  Neurological:     Mental Status: She is alert.      ED Treatments / Results  Labs (all labs ordered are listed, but only abnormal results are displayed) Labs Reviewed - No data to display  EKG  Radiology No results found.  Procedures Procedures (including critical care time)  Medications Ordered in ED Medications - No data to display   Initial Impression / Assessment and Plan / ED Course  I have reviewed the triage vital signs and the nursing notes.  Pertinent labs & imaging results that  were available during my care of the patient were reviewed by me and considered in my medical decision making (see chart for details).   Discussed with patient unlikely that this is a strep infection given current use of Augmentin. Possible that this is a combination of irritation from intubation and a new viral infection. Advised patient use Chlorseptic spray and warm salt gargle rinses. Patient understands and agrees with plan of care.   Final Clinical Impressions(s) / ED Diagnoses   Final diagnoses:  None    New Prescriptions New Prescriptions   No medications on file

## 2023-03-18 NOTE — Patient Instructions (Signed)
Medication Instructions:  Your physician has recommended you make the following change in your medication:   INCREASE Carvedilol to 12.5 mg twice a day  *If you need a refill on your cardiac medications before your next appointment, please call your pharmacy*   Lab Work: None  If you have labs (blood work) drawn today and your tests are completely normal, you will receive your results only by: [place] (if you have MyChart) OR A paper copy in the mail If you have any lab test that is abnormal or we need to change your treatment, we will call you to review the results.   Testing/Procedures: None   Follow-Up: At [facility], you and your health needs are our priority.  As part of our continuing mission to provide you with exceptional heart care, we have created designated Provider Care Teams.  These Care Teams include your primary Cardiologist (physician) and Advanced Practice Providers (APPs -  Physician Assistants and Nurse Practitioners) who all work together to provide you with the care you need, when you need it.   Your next appointment:   3 month(s)  Provider:   [name], MD or Gerrie Nordmann, NP    Other Instructions Referral to Dr. Quentin Ore for your afib  Referral to Rheumatology for Ehlers-Danlos Syndrome

## 2023-03-18 NOTE — Discharge Instructions (Signed)
Follow up here or with your primary care provider if your symptoms are worsening or not improving.     

## 2023-03-18 NOTE — ED Provider Notes (Signed)
Rachel Torres    CSN: [id number] Arrival date & time: 03/18/23  1015      History   Chief Complaint Chief Complaint  Patient presents with   Sore Throat    HPI Rachel Torres is a 67 y.o. female.    Sore Throat    Presents with complaint of sore throat following a cardiac procedure with intubation yesterday.  She endorses irritation in her throat but states her husband was positive for strep.  Patient is also currently taking Augmentin for a previously diagnosed ear infection.  Past Medical History:  Diagnosis Date   AAA (abdominal aortic aneurysm) ([place])    a. Chronic w/o evidence of aneurysmal dil on CTA 01/2016.   Alcohol abuse    Allergy    Anemia    Anxiety    Asthma    CAD (coronary artery disease)    a. 08/2010 s/p CABG x 1 (VG->RCA) @ time of Ao dissection repair; b. 01/2016 Lexiscan MV: mid antsept/apical defect w/ ? peri-infarct ischemia-->likely attenuation-->Med Rx.   Chewing difficulty    Chronic combined systolic (congestive) and diastolic (congestive) heart failure ([place])    a. 2012 EF 30-35%; b. 04/2015 EF 40-45%; c. 01/2016 Echo: Ef 55-65%, nl AoV bioprosthesis, nl RV, nl PASP.   Constipation    Depression    Edema of both lower extremities    Gallbladder sludge    GERD (gastroesophageal reflux disease)    Heart failure (HCC)    Heart valve problem    History of Bicuspid Aortic Valve    a. 08/2010 s/p AVR @ time of Ao dissection repair; b. 01/2016 Echo: Ef 55-65%, nl AoV bioprosthesis, nl RV, nl PASP.   Hx of repair of dissecting thoracic aortic aneurysm, [place] type A    a. 08/2010 s/p repair with AVR and VG->RCA; b. 01/2016 CTA: stable appearance of Asc Thoracic Aortic graft. Opacification of flase lumen of chronic abd Ao dissection w/ retrograde flow through lumbar arteries. No aneurysmal dil of flase lumen.   Hyperlipidemia    Hypertension    Hypertensive heart disease    IBS (irritable bowel syndrome)    Internal hemorrhoids    Kidney  problem    Marfan syndrome    pt denies   Migraine    Obstructive sleep apnea    Osteoarthritis    Osteoporosis    Palpitations    Pneumonia    RA (rheumatoid arthritis) ([place])    a. Followed by Dr. [name]    Patient Active Problem List   Diagnosis Date Noted   BMI 34.0-34.9,adult 02/17/2023   Obesity, Beginning BMI 35.45 02/17/2023   Tinnitus 01/14/2023   Change in vision 01/14/2023   Colon cancer screening 01/14/2023   Personal history of fall 01/14/2023   Problem related to unspecified psychosocial circumstances 12/28/2022   Person encountering health services to consult on behalf of another person 12/28/2022   Palpitation 12/10/2022   Fatigue 10/26/2022   B12 deficiency 10/26/2022   Right shoulder pain 09/15/2022   Depression 09/14/2022   Class 2 severe obesity with serious comorbidity and body mass index (BMI) of 35.0 to 35.9 in adult [facility]) 09/14/2022   Other constipation 05/12/2022   Medicare annual wellness visit, initial 07/17/2021   TMJ (dislocation of temporomandibular joint) 07/17/2021   Encounter for screening mammogram for breast cancer 06/05/2021   Diarrhea 04/11/2020   Epigastric pain 02/19/2020   Dark stools 02/19/2020   History of atrial fibrillation 02/05/2019   Shortness of breath 01/27/2019  Irregular surface of cornea 12/25/2018   HPV (human papilloma virus) infection 12/01/2016   Screening mammogram, encounter for 11/29/2016   Estrogen deficiency 11/29/2016   Encounter for routine gynecological examination 11/29/2016   Grade III hemorrhoids 10/06/2016   Tachycardia 08/14/2016   Hemorrhoids 08/13/2016   Atypical migraine 08/03/2016   CAD (coronary artery disease)    AAA (abdominal aortic aneurysm) (HCC)    Chronic combined systolic (congestive) and diastolic (congestive) heart failure ([place])    Prediabetes 02/11/2016   Generalized anxiety disorder 12/08/2015   Panic attacks 07/28/2015   Chest pain 07/09/2013   Sensorineural hearing loss  03/27/2013   Vitamin D deficiency 07/03/2012   H/O aortic dissection 05/12/2012   Routine general medical examination at a health care facility 09/20/2011   Atypical chest pain 09/15/2011   S/P AVR (aortic valve replacement) 01/07/2011   CORONARY ARTERY BYPASS GRAFT, HX OF 01/07/2011   Arthritis 12/20/2010   Back pain 12/15/2010   H/O dissecting abdominal aortic aneurysm repair 08/24/2010   IRRITABLE BOWEL SYNDROME 09/09/2009   Obstructive sleep apnea 08/04/2009   External hemorrhoid 06/26/2009   Osteoporosis 01/09/2009   Hyperlipidemia 07/26/2007   Depression with anxiety 07/26/2007   Essential hypertension 07/26/2007   Allergic rhinitis 07/26/2007   Asthma 07/26/2007   GERD 07/26/2007   Osteoarthritis 07/26/2007   Ocular migraine 07/26/2007   Mild intermittent asthma without complication [id number]    Past Surgical History:  Procedure Laterality Date   ABDOMINAL AORTIC ANEURYSM REPAIR     CARDIAC CATHETERIZATION  2001   [place]     COLONOSCOPY     ESOPHAGOGASTRODUODENOSCOPY (EGD) WITH PROPOFOL N/A 05/08/2020   Procedure: ESOPHAGOGASTRODUODENOSCOPY (EGD) WITH PROPOFOL;  Surgeon: Virgel Manifold, MD;  Location: [facility] ENDOSCOPY;  Service: Endoscopy;  Laterality: N/A;   FRACTURE SURGERY Right    shoulder replacement   HEMORRHOID BANDING     ORIF DISTAL RADIUS FRACTURE Left    UPPER GASTROINTESTINAL ENDOSCOPY      OB History     Gravida  2   Para      Term      Preterm      AB      Living         SAB      IAB      Ectopic      Multiple      Live Births               Home Medications    Prior to Admission medications   Medication Sig Start Date End Date Taking? Authorizing Provider  acetaminophen (TYLENOL) 500 MG tablet Take 500 mg by mouth every 6 (six) hours as needed.   Yes [provider]  albuterol (VENTOLIN HFA) 108 (90 Base) MCG/ACT inhaler Inhale 1-2 puffs into the lungs every 6 (six)  hours as needed for wheezing or shortness of breath. 07/17/21  Yes Tower, Wynelle Fanny, MD  ALPRAZolam (XANAX) 0.5 MG tablet Take 1 tablet (0.5 mg total) by mouth daily as needed for anxiety. 01/17/23  Yes Tower, Wynelle Fanny, MD  amoxicillin-clavulanate (AUGMENTIN) 875-125 MG tablet Take 1 tablet by mouth 2 (two) times daily. 03/10/23  Yes [provider]  atorvastatin (LIPITOR) 20 MG tablet Take 20 mg by mouth. Four days per week   Yes [provider]  carvedilol (COREG) 12.5 MG tablet Take 1 tablet (12.5 mg total) by mouth 2 (two) times daily. 03/18/23 06/16/23 Yes Gerrie Nordmann, NP  Cholecalciferol (VITAMIN D3) 50 MCG (2000 UT) CAPS Take 2,000 capsules by mouth. FOUR TIMES A WEEK   Yes [provider]  cyclobenzaprine (FLEXERIL) 10 MG tablet Take 0.5-1 tablets (5-10 mg total) by mouth 3 (three) times daily as needed. For headache 03/17/22  Yes Tower, Wynelle Fanny, MD  ELIQUIS 5 MG TABS tablet Take 5 mg by mouth 2 (two) times daily.   Yes [provider]  ezetimibe (ZETIA) 10 MG tablet Take 1 tablet (10 mg total) by mouth daily. 09/21/22  Yes Hammock, Sheri, NP  fluticasone (FLONASE) 50 MCG/ACT nasal spray Place into both nostrils. 09/22/22  Yes [provider]  furosemide (LASIX) 20 MG tablet Take 40 mg (2 tablets) one day and the next day 20 mg, alternating daily Patient taking differently: Take 40 mg by mouth 2 (two) times daily. Take 40 mg (2 tablets) one day and the next day 20 mg, alternating daily 01/25/23  Yes Gollan, Kathlene November, MD  hydrOXYzine (ATARAX) 25 MG tablet Take 25 mg by mouth 3 (three) times daily. 01/05/23  Yes [provider]  isosorbide mononitrate (IMDUR) 30 MG 24 hr tablet Take 0.5 tablets (15 mg total) by mouth daily. 10/25/22  Yes Hammock, Barbera Setters, NP  ketoconazole (NIZORAL) 2 % cream Apply to rash on the abdomen area 7 days a week prn 01/27/23  Yes Ralene Bathe, MD  ketoconazole (NIZORAL) 2 % shampoo apply three times per week, massage into  scalp and leave in for 10 minutes before rinsing out 01/27/23  Yes Ralene Bathe, MD  lisinopril (ZESTRIL) 5 MG tablet Take 2.5 mg by mouth daily.   Yes [provider]  magnesium gluconate (MAGONATE) 500 MG tablet Take 500 mg by mouth at bedtime.   Yes [provider]  Melatonin 5 MG SUBL Place under the tongue at bedtime as needed.   Yes [provider]  mometasone (ELOCON) 0.1 % lotion Apply to scalp 5 nights week prn itch 01/27/23  Yes Ralene Bathe, MD  omeprazole (PRILOSEC) 40 MG capsule TAKE 1 CAPSULE EVERY DAY 12/06/22  Yes Tower, Marne A, MD  polyethylene glycol powder (GLYCOLAX/MIRALAX) 17 GM/SCOOP powder Take 17 g by mouth as needed. 06/24/21  Yes Beasley, Caren D, MD  Potassium Aminobenzoate 500 MG CAPS Take 500 mg by mouth 2 (two) times daily.   Yes [provider]  potassium chloride (KLOR-CON M) 10 MEQ tablet Take 10 mEq by mouth 2 (two) times daily.   Yes [provider]  promethazine (PHENERGAN) 25 MG tablet Take 0.5 tablets (12.5 mg total) by mouth every 8 (eight) hours as needed for nausea. 03/17/22  Yes Tower, Wynelle Fanny, MD  Simethicone (MYLANTA GAS PO) Take by mouth 3 times/day as needed-between meals & bedtime.   Yes [provider]  TART CHERRY PO Take 1 capsule by mouth 2 (two) times daily.   Yes [provider]  topiramate (TOPAMAX) 50 MG tablet Take 1 tablet (50 mg total) by mouth at bedtime. 02/17/23  Yes Leafy Ro, Caren D, MD  venlafaxine XR (EFFEXOR-XR) 150 MG 24 hr capsule TAKE 1 CAPSULE EVERY DAY WITH BREAKFAST 06/02/22  Yes Tower, Roque Lias A, MD  venlafaxine XR (EFFEXOR-XR) 37.5 MG 24 hr capsule TAKE 1 CAPSULE (37.5 MG TOTAL) BY MOUTH DAILY WITH BREAKFAST. 05/24/22  Yes Tower, Wynelle Fanny, MD  aspirin EC 81 MG tablet Take 81 mg by mouth at bedtime.    [provider]  clindamycin (CLEOCIN) 300 MG capsule Take 300 mg by  mouth 3 (three) times daily. 01/10/23   [provider]  hydrocortisone 2.5 % cream  Apply to rash on the abdomen 5 nights a week prn irritation 01/27/23   Ralene Bathe, MD  nitroGLYCERIN (NITROSTAT) 0.4 MG SL tablet Place 1 tablet (0.4 mg total) under the tongue every 5 (five) minutes as needed for chest pain. 06/07/22   Minna Merritts, MD    Family History Family History  Problem Relation Age of Onset   Hypertension Mother    Depression Mother    Osteoporosis Mother    Stroke Mother    Irritable bowel syndrome Mother    Asthma Mother    Heart disease Mother    Thyroid disease Mother    Anxiety disorder Mother    Other Father 4   Arthritis Sister    Diabetes Sister    Heart disease Sister    Asthma Sister    Migraines Sister    Diabetes Sister    Nephrolithiasis Sister    Osteoporosis Sister    Arthritis Sister    Asthma Sister    Migraines Sister    Diabetes Brother    Other Brother        prediabeties   Asthma Brother    Migraines Brother    Stroke Maternal Grandmother    Colon cancer Maternal Grandmother    Lymphoma Maternal Grandmother    Migraines Maternal Grandmother    Lung cancer Maternal Grandfather    Melanoma Maternal Grandfather    Aneurysm Paternal Grandmother        brain   Diabetes Paternal Grandmother    Arthritis Paternal Grandmother    Arthritis Paternal Grandfather    Diabetes Paternal Grandfather     Social History Social History   Tobacco Use   Smoking status: Former    Packs/day: 0.75    Years: 1.50    Additional pack years: 0.00    Total pack years: 1.13    Types: Cigarettes    Quit date: 12/20/1978    Years since quitting: 44.2   Smokeless tobacco: Never  Vaping Use   Vaping Use: Never used  Substance Use Topics   Alcohol use: Yes    Comment: 2-3 glasses of red wine per month   Drug use: No     Allergies   Fosamax [alendronate sodium], Ginzing [ginseng], Hydrocod poli-chlorphe poli er, Hydrocodone-acetaminophen, Imitrex [sumatriptan], Oxycodone, Peanut-containing drug products, Tramadol,  Hydrocodone-guaifenesin, Prednisone, and Tape   Review of Systems Review of Systems   Physical Exam Triage Vital Signs ED Triage Vitals  Enc Vitals Group     BP 03/18/23 1143 124/73     Pulse Rate 03/18/23 1143 69     Resp 03/18/23 1143 18     Temp 03/18/23 1143 98.1 F (36.7 C)     Temp Source 03/18/23 1143 Oral     SpO2 03/18/23 1143 96 %     Weight --      Height --      Head Circumference --      Peak Flow --      Pain Score 03/18/23 1149 4     Pain Loc --      Pain Edu? --      Excl. in [place]? --    No data found.  Updated Vital Signs BP 124/73 (BP Location: Left Arm)   Pulse 69   Temp 98.1 F (36.7 C) (Oral)   Resp 18   SpO2 96%   Visual Acuity Right  Eye Distance:   Left Eye Distance:   Bilateral Distance:    Right Eye Near:   Left Eye Near:    Bilateral Near:     Physical Exam Vitals reviewed.  Constitutional:      Appearance: She is well-developed. She is not ill-appearing.  HENT:     Mouth/Throat:     Mouth: Mucous membranes are moist.     Pharynx: Posterior oropharyngeal erythema present. No oropharyngeal exudate.  Skin:    General: Skin is warm and dry.  Neurological:     General: No focal deficit present.     Mental Status: She is alert and oriented to person, place, and time.  Psychiatric:        Mood and Affect: Mood normal.        Behavior: Behavior normal.      UC Treatments / Results  Labs (all labs ordered are listed, but only abnormal results are displayed) Labs Reviewed - No data to display  EKG   Radiology No results found.  Procedures Procedures (including critical care time)  Medications Ordered in UC Medications - No data to display  Initial Impression / Assessment and Plan / UC Course  I have reviewed the triage vital signs and the nursing notes.  Pertinent labs & imaging results that were available during my care of the patient were reviewed by me and considered in my medical decision making (see chart for  details).   Patient is afebrile here without recent antipyretics. Satting well on room air. Overall is well appearing, well hydrated, without respiratory distress.  Mild pharyngeal erythema is present without peritonsillar exudates.  Given patient is already using Augmentin, likelihood of a strep infection is very remote.  A more likely diagnosis is irritation from her procedure yesterday versus viral infection.  Recommended patient use throat lozenges, gargle with warm salt water, use of Chloraseptic throat spray if necessary.  Final Clinical Impressions(s) / UC Diagnoses   Final diagnoses:  None   Discharge Instructions   None    ED Prescriptions   None    PDMP not reviewed this encounter.   Rose Phi, [place] 03/18/23 1215

## 2023-03-18 NOTE — Progress Notes (Signed)
Called and spoke with patient to inquire if everything was ok. She is currently at Urgent Care to be seen for sore throat. She states her caregiver was diagnosed with strep throat as well so she wanted to get checked out. Advised that she give [place] a call if we can assist with anything. She verbalized understanding with no further questions at this time.

## 2023-03-21 ENCOUNTER — Telehealth: Payer: Self-pay | Admitting: Cardiology

## 2023-03-21 DIAGNOSIS — Q796 Ehlers-Danlos syndrome, unspecified: Secondary | ICD-10-CM

## 2023-03-21 NOTE — Telephone Encounter (Signed)
Spoke with patient and reviewed information on referral. Advised that I would find sports medicine provider and will be in touch. She verbalized understanding with no further questions at this time.

## 2023-03-21 NOTE — Telephone Encounter (Signed)
Gerrie Nordmann, NP  Valora Corporal, RN Please let the patient know that they do not treat Ehlers-Danlos Syndrome and to ensure she is OK with a referral to sports medicine.       Previous Messages    ----- Message ----- From: Carole Binning, LPN Sent: [id number]   8:25 AM EDT To: Gerrie Nordmann, NP  We appreciate your referral. Each referral is reviewed by our providers to ensure that the patient will receive the best medical care possible. Upon review of this referral Dr. Estanislado Pandy and Dr. Benjamine Mola have declined it.  We do not treat Ehlers-Danlos syndrome. Recommend referral to sports medicine.

## 2023-03-24 ENCOUNTER — Ambulatory Visit (INDEPENDENT_AMBULATORY_CARE_PROVIDER_SITE_OTHER): Payer: Medicare HMO | Admitting: Psychology

## 2023-03-24 DIAGNOSIS — F331 Major depressive disorder, recurrent, moderate: Secondary | ICD-10-CM
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
## 2023-03-29 ENCOUNTER — Ambulatory Visit (INDEPENDENT_AMBULATORY_CARE_PROVIDER_SITE_OTHER): Payer: Medicare HMO | Admitting: Psychology

## 2023-03-29 DIAGNOSIS — F331 Major depressive disorder, recurrent, moderate: Secondary | ICD-10-CM | POA: Diagnosis not present

## 2023-03-29 NOTE — Telephone Encounter (Signed)
Spoke with patient and reviewed options for Sports Medicine provider. Provided her with office and their number. She verbalized understanding with no further questions at this time.

## 2023-03-29 NOTE — Progress Notes (Signed)
[facility] Counselor/Therapist Progress Note  Patient ID: EGAN ZAMOR, MRN: [id number],    Date: 03/29/2023  Time Spent: 10:00am-10:50am   50 minutes   Treatment Type: Individual Therapy  Reported Symptoms: stress  Mental Status Exam: Appearance:  Casual     Behavior: Appropriate  Motor: Normal  Speech/Language:  Normal Rate  Affect: Appropriate  Mood: normal  Thought process: normal  Thought content:   WNL  Sensory/Perceptual disturbances:   WNL  Orientation: oriented to person, place, time/date, and situation  Attention: Good  Concentration: Good  Memory: WNL  Fund of knowledge:  Good  Insight:   Good  Judgment:  Good  Impulse Control: Good   Risk Assessment: Danger to Self:  No Self-injurious Behavior: No Danger to Others: No Duty to Warn:no Physical Aggression / Violence:No  Access to Firearms a concern: No  Gang Involvement:No   Subjective: Pt present for face-to-face individual therapy via video Webex.  Pt consents to telehealth video session due to COVID 19 pandemic. Location of pt: home Location of therapist: home office.   Pt talked about falling at [employer] and breaking her finger.   She is being treated by her doctor.   Pt is busy over seeing the house renovations.   The projects are going well so far.   Pt talked about care giving for [name].  Addressed the stress and how difficult it can be to manage [name] as he declines.   Pt reached out to her pastor for support.   Worked on self care strategies.  Provided supportive therapy.  Interventions: Cognitive Behavioral Therapy and Insight-Oriented  Diagnosis:  F33.1  Plan of Care: Recommend ongoing therapy.   Pt participated in setting treatment goals.   Pt needs a place to talk about stressors.  Plan to meet every two weeks.    Treatment Plan (target date: 03/23/2024) Client Abilities/Strengths  Pt is bright, engaging, and motivated for therapy.   Client Treatment  Preferences  Individual therapy.  Client Statement of Needs  Improve coping skills.  Have a place to talk about stressors.   Symptoms  Depressed or irritable mood. Unresolved grief issues.  Problems Addressed  Unipolar Depression Goals 1. Alleviate depressive symptoms and return to previous level of effective functioning. 2. Appropriately grieve the loss in order to normalize mood and to return to previously adaptive level of functioning. Objective Learn and implement behavioral strategies to overcome depression. Target Date: 2024-03-23 Frequency: Biweekly  Progress: 40 Modality: individual  Related Interventions Engage the client in "behavioral activation," increasing his/her activity level and contact with sources of reward, while identifying processes that inhibit activation.  Use behavioral techniques such as instruction, rehearsal, role-playing, role reversal, as needed, to facilitate activity in the client's daily life; reinforce success. Assist the client in developing skills that increase the likelihood of deriving pleasure from behavioral activation (e.g., assertiveness skills, developing an exercise plan, less internal/more external focus, increased social involvement); reinforce success. Objective Identify important people in life, past and present, and describe the quality, good and poor, of those relationships. Target Date: 2024-03-23 Frequency: Biweekly  Progress: 40 Modality: individual  Related Interventions Conduct Interpersonal Therapy beginning with the assessment of the client's "interpersonal inventory" of important past and present relationships; develop a case formulation linking depression to grief, interpersonal role disputes, role transitions, and/or interpersonal deficits). Objective Learn and implement problem-solving and decision-making skills. Target Date: 2024-03-23 Frequency: Biweekly  Progress: 40 Modality: individual  Related Interventions Conduct  Problem-Solving Therapy using  techniques such as psychoeducation, modeling, and role-playing to teach client problem-solving skills (i.e., defining a problem specifically, generating possible solutions, evaluating the pros and cons of each solution, selecting and implementing a plan of action, evaluating the efficacy of the plan, accepting or revising the plan); role-play application of the problem-solving skill to a real life issue. Encourage in the client the development of a positive problem orientation in which problems and solving them are viewed as a natural part of life and not something to be feared, despaired, or avoided. 3. Develop healthy interpersonal relationships that lead to the alleviation and help prevent the relapse of depression. 4. Develop healthy thinking patterns and beliefs about self, others, and the world that lead to the alleviation and help prevent the relapse of depression. 5. Recognize, accept, and cope with feelings of depression. Diagnosis F33.1  Conditions For Discharge Achievement of treatment goals and objectives   Salomon Fick, LCSW

## 2023-04-02 DIAGNOSIS — S40011A Contusion of right shoulder, initial encounter: Secondary | ICD-10-CM | POA: Diagnosis not present

## 2023-04-05 DIAGNOSIS — S62644D Nondisplaced fracture of proximal phalanx of right ring finger, subsequent encounter for fracture with routine healing: Secondary | ICD-10-CM | POA: Diagnosis not present

## 2023-04-07 NOTE — Telephone Encounter (Signed)
Spoke with patient and she states that she has not had a chance to set up appointment yet due to recent fall and broke her finger and shoulder was hurting. She does have referral information to call and schedule appointment and she states that she will make sure to do that when she is feeling better. No further needs at this time.

## 2023-04-12 ENCOUNTER — Ambulatory Visit (INDEPENDENT_AMBULATORY_CARE_PROVIDER_SITE_OTHER): Payer: Medicare HMO | Admitting: Psychology

## 2023-04-12 DIAGNOSIS — F331 Major depressive disorder, recurrent, moderate: Secondary | ICD-10-CM

## 2023-04-12 NOTE — Progress Notes (Signed)
[facility] Counselor/Therapist Progress Note  Patient ID: Rachel Torres, MRN: [id number],    Date: 04/12/2023  Time Spent: 10:00am-10:55am   55 minutes   Treatment Type: Individual Therapy  Reported Symptoms: stress  Mental Status Exam: Appearance:  Casual     Behavior: Appropriate  Motor: Normal  Speech/Language:  Normal Rate  Affect: Appropriate  Mood: normal  Thought process: normal  Thought content:   WNL  Sensory/Perceptual disturbances:   WNL  Orientation: oriented to person, place, time/date, and situation  Attention: Good  Concentration: Good  Memory: WNL  Fund of knowledge:  Good  Insight:   Good  Judgment:  Good  Impulse Control: Good   Risk Assessment: Danger to Self:  No Self-injurious Behavior: No Danger to Others: No Duty to Warn:no Physical Aggression / Violence:No  Access to Firearms a concern: No  Gang Involvement:No   Subjective: Pt present for face-to-face individual therapy via video.  Pt consents to telehealth video session due to COVID 19 pandemic. Location of pt: home Location of therapist: home office.   Pt talked about feeling overwhelmed with all she is dealing with regarding her health issues,  home renovations, and care giving for Rachel Torres. Pt just had another grand baby yesterday.   A healthy baby boy was born and pt is very excited.   Pt talked about care giving for her husband Rachel Torres.  Addressed the stress and how difficult it can be to manage Rachel Torres as he declines.   Pt reached out to her pastor for support.   Worked on self care strategies.  Provided supportive therapy.  Interventions: Cognitive Behavioral Therapy and Insight-Oriented  Diagnosis:  F33.1  Plan of Care: Recommend ongoing therapy.   Pt participated in setting treatment goals.   Pt needs a place to talk about stressors.  Plan to meet every two weeks.    Treatment Plan (target date: 03/23/2024) Client Abilities/Strengths  Pt is bright, engaging, and  motivated for therapy.   Client Treatment Preferences  Individual therapy.  Client Statement of Needs  Improve coping skills.  Have a place to talk about stressors.   Symptoms  Depressed or irritable mood. Unresolved grief issues.  Problems Addressed  Unipolar Depression Goals 1. Alleviate depressive symptoms and return to previous level of effective functioning. 2. Appropriately grieve the loss in order to normalize mood and to return to previously adaptive level of functioning. Objective Learn and implement behavioral strategies to overcome depression. Target Date: 2024-03-23 Frequency: Biweekly  Progress: 40 Modality: individual  Related Interventions Engage the client in "behavioral activation," increasing his/her activity level and contact with sources of reward, while identifying processes that inhibit activation.  Use behavioral techniques such as instruction, rehearsal, role-playing, role reversal, as needed, to facilitate activity in the client's daily life; reinforce success. Assist the client in developing skills that increase the likelihood of deriving pleasure from behavioral activation (e.g., assertiveness skills, developing an exercise plan, less internal/more external focus, increased social involvement); reinforce success. Objective Identify important people in life, past and present, and describe the quality, good and poor, of those relationships. Target Date: 2024-03-23 Frequency: Biweekly  Progress: 40 Modality: individual  Related Interventions Conduct Interpersonal Therapy beginning with the assessment of the client's "interpersonal inventory" of important past and present relationships; develop a case formulation linking depression to grief, interpersonal role disputes, role transitions, and/or interpersonal deficits). Objective Learn and implement problem-solving and decision-making skills. Target Date: 2024-03-23 Frequency: Biweekly  Progress: 40 Modality:  individual  Related Interventions Conduct  Problem-Solving Therapy using techniques such as psychoeducation, modeling, and role-playing to teach client problem-solving skills (i.e., defining a problem specifically, generating possible solutions, evaluating the pros and cons of each solution, selecting and implementing a plan of action, evaluating the efficacy of the plan, accepting or revising the plan); role-play application of the problem-solving skill to a real life issue. Encourage in the client the development of a positive problem orientation in which problems and solving them are viewed as a natural part of life and not something to be feared, despaired, or avoided. 3. Develop healthy interpersonal relationships that lead to the alleviation and help prevent the relapse of depression. 4. Develop healthy thinking patterns and beliefs about self, others, and the world that lead to the alleviation and help prevent the relapse of depression. 5. Recognize, accept, and cope with feelings of depression. Diagnosis F33.1  Conditions For Discharge Achievement of treatment goals and objectives   [name], LCSW

## 2023-04-14 DIAGNOSIS — M25511 Pain in right shoulder: Secondary | ICD-10-CM | POA: Diagnosis not present

## 2023-04-14 DIAGNOSIS — S62644D Nondisplaced fracture of proximal phalanx of right ring finger, subsequent encounter for fracture with routine healing: Secondary | ICD-10-CM | POA: Diagnosis not present

## 2023-04-18 ENCOUNTER — Ambulatory Visit (INDEPENDENT_AMBULATORY_CARE_PROVIDER_SITE_OTHER): Payer: Medicare HMO | Admitting: Psychology

## 2023-04-18 ENCOUNTER — Ambulatory Visit: Payer: Medicare HMO | Admitting: Psychology

## 2023-04-18 DIAGNOSIS — F331 Major depressive disorder, recurrent, moderate: Secondary | ICD-10-CM

## 2023-04-18 NOTE — Progress Notes (Signed)
[facility] Counselor/Therapist Progress Note  Patient ID: OTILIA KAREEM, MRN: [id number],    Date: 04/18/2023  Time Spent: 3:00pm-3:50pm   50 minutes   Treatment Type: Individual Therapy  Reported Symptoms: stress  Mental Status Exam: Appearance:  Casual     Behavior: Appropriate  Motor: Normal  Speech/Language:  Normal Rate  Affect: Appropriate  Mood: normal  Thought process: normal  Thought content:   WNL  Sensory/Perceptual disturbances:   WNL  Orientation: oriented to person, place, time/date, and situation  Attention: Good  Concentration: Good  Memory: WNL  Fund of knowledge:  Good  Insight:   Good  Judgment:  Good  Impulse Control: Good   Risk Assessment: Danger to Self:  No Self-injurious Behavior: No Danger to Others: No Duty to Warn:no Physical Aggression / Violence:No  Access to Firearms a concern: No  Gang Involvement:No   Subjective: Pt present for face-to-face individual therapy via video.  Pt consents to telehealth video session due to COVID 19 pandemic. Location of pt: home Location of therapist: home office.   Pt talked about feeling overwhelmed with all she is dealing with regarding her health issues,  home renovations, and care giving for [place]. Pt talked about care giving for [name].  Addressed the stress and how difficult it can be to manage [name] as he declines.  Worked on self care strategies.  Provided supportive therapy.  Interventions: Cognitive Behavioral Therapy and Insight-Oriented  Diagnosis:  F33.1  Plan of Care: Recommend ongoing therapy.   Pt participated in setting treatment goals.   Pt needs a place to talk about stressors.  Plan to meet every two weeks.    Treatment Plan (target date: 03/23/2024) Client Abilities/Strengths  Pt is bright, engaging, and motivated for therapy.   Client Treatment Preferences  Individual therapy.  Client Statement of Needs  Improve coping skills.  Have a place to talk  about stressors.   Symptoms  Depressed or irritable mood. Unresolved grief issues.  Problems Addressed  Unipolar Depression Goals 1. Alleviate depressive symptoms and return to previous level of effective functioning. 2. Appropriately grieve the loss in order to normalize mood and to return to previously adaptive level of functioning. Objective Learn and implement behavioral strategies to overcome depression. Target Date: 2024-03-23 Frequency: Biweekly  Progress: 40 Modality: individual  Related Interventions Engage the client in "behavioral activation," increasing his/her activity level and contact with sources of reward, while identifying processes that inhibit activation.  Use behavioral techniques such as instruction, rehearsal, role-playing, role reversal, as needed, to facilitate activity in the client's daily life; reinforce success. Assist the client in developing skills that increase the likelihood of deriving pleasure from behavioral activation (e.g., assertiveness skills, developing an exercise plan, less internal/more external focus, increased social involvement); reinforce success. Objective Identify important people in life, past and present, and describe the quality, good and poor, of those relationships. Target Date: 2024-03-23 Frequency: Biweekly  Progress: 40 Modality: individual  Related Interventions Conduct Interpersonal Therapy beginning with the assessment of the client's "interpersonal inventory" of important past and present relationships; develop a case formulation linking depression to grief, interpersonal role disputes, role transitions, and/or interpersonal deficits). Objective Learn and implement problem-solving and decision-making skills. Target Date: 2024-03-23 Frequency: Biweekly  Progress: 40 Modality: individual  Related Interventions Conduct Problem-Solving Therapy using techniques such as psychoeducation, modeling, and role-playing to teach client  problem-solving skills (i.e., defining a problem specifically, generating possible solutions, evaluating the pros and cons of each solution, selecting and  implementing a plan of action, evaluating the efficacy of the plan, accepting or revising the plan); role-play application of the problem-solving skill to a real life issue. Encourage in the client the development of a positive problem orientation in which problems and solving them are viewed as a natural part of life and not something to be feared, despaired, or avoided. 3. Develop healthy interpersonal relationships that lead to the alleviation and help prevent the relapse of depression. 4. Develop healthy thinking patterns and beliefs about self, others, and the world that lead to the alleviation and help prevent the relapse of depression. 5. Recognize, accept, and cope with feelings of depression. Diagnosis F33.1  Conditions For Discharge Achievement of treatment goals and objectives   Salomon Fick, LCSW

## 2023-04-25 ENCOUNTER — Ambulatory Visit: Payer: Medicare HMO

## 2023-04-26 ENCOUNTER — Ambulatory Visit (INDEPENDENT_AMBULATORY_CARE_PROVIDER_SITE_OTHER): Payer: Medicare HMO | Admitting: Psychology

## 2023-04-26 DIAGNOSIS — F331 Major depressive disorder, recurrent, moderate: Secondary | ICD-10-CM | POA: Diagnosis not present

## 2023-04-26 NOTE — Progress Notes (Signed)
[facility] Counselor/Therapist Progress Note  Patient ID: ANDREAL VANDELOO, MRN: [id number],    Date: 04/26/2023  Time Spent: 10:00am-10:50am   50 minutes   Treatment Type: Individual Therapy  Reported Symptoms: stress  Mental Status Exam: Appearance:  Casual     Behavior: Appropriate  Motor: Normal  Speech/Language:  Normal Rate  Affect: Appropriate  Mood: normal  Thought process: normal  Thought content:   WNL  Sensory/Perceptual disturbances:   WNL  Orientation: oriented to person, place, time/date, and situation  Attention: Good  Concentration: Good  Memory: WNL  Fund of knowledge:  Good  Insight:   Good  Judgment:  Good  Impulse Control: Good   Risk Assessment: Danger to Self:  No Self-injurious Behavior: No Danger to Others: No Duty to Warn:no Physical Aggression / Violence:No  Access to Firearms a concern: No  Gang Involvement:No   Subjective: Pt present for face-to-face individual therapy via video.  Pt consents to telehealth video session due to COVID 19 pandemic. Location of pt: home Location of therapist: home office.   Pt talked about feeling overwhelmed with all she is dealing with regarding her health issues,  home renovations, and care giving for [name].  Pt will have shoulder surgery in August and may have to put [name] in a nursing facility while she is rehabing at home.  [name] does not want to go to a nursing facility but he may need to anyway. Pt talked about care giving for [name].  Addressed the stress and how difficult it can be to manage [name] as he declines.  Worked on self care strategies.  Provided supportive therapy.  Interventions: Cognitive Behavioral Therapy and Insight-Oriented  Diagnosis:  F33.1  Plan of Care: Recommend ongoing therapy.   Pt participated in setting treatment goals.   Pt needs a place to talk about stressors.  Plan to meet every two weeks.    Treatment Plan (target date: 03/23/2024) Client  Abilities/Strengths  Pt is bright, engaging, and motivated for therapy.   Client Treatment Preferences  Individual therapy.  Client Statement of Needs  Improve coping skills.  Have a place to talk about stressors.   Symptoms  Depressed or irritable mood. Unresolved grief issues.  Problems Addressed  Unipolar Depression Goals 1. Alleviate depressive symptoms and return to previous level of effective functioning. 2. Appropriately grieve the loss in order to normalize mood and to return to previously adaptive level of functioning. Objective Learn and implement behavioral strategies to overcome depression. Target Date: 2024-03-23 Frequency: Biweekly  Progress: 40 Modality: individual  Related Interventions Engage the client in "behavioral activation," increasing his/her activity level and contact with sources of reward, while identifying processes that inhibit activation.  Use behavioral techniques such as instruction, rehearsal, role-playing, role reversal, as needed, to facilitate activity in the client's daily life; reinforce success. Assist the client in developing skills that increase the likelihood of deriving pleasure from behavioral activation (e.g., assertiveness skills, developing an exercise plan, less internal/more external focus, increased social involvement); reinforce success. Objective Identify important people in life, past and present, and describe the quality, good and poor, of those relationships. Target Date: 2024-03-23 Frequency: Biweekly  Progress: 40 Modality: individual  Related Interventions Conduct Interpersonal Therapy beginning with the assessment of the client's "interpersonal inventory" of important past and present relationships; develop a case formulation linking depression to grief, interpersonal role disputes, role transitions, and/or interpersonal deficits). Objective Learn and implement problem-solving and decision-making skills. Target Date: 2024-03-23  Frequency: Biweekly  Progress: 40 Modality: individual  Related Interventions Conduct Problem-Solving Therapy using techniques such as psychoeducation, modeling, and role-playing to teach client problem-solving skills (i.e., defining a problem specifically, generating possible solutions, evaluating the pros and cons of each solution, selecting and implementing a plan of action, evaluating the efficacy of the plan, accepting or revising the plan); role-play application of the problem-solving skill to a real life issue. Encourage in the client the development of a positive problem orientation in which problems and solving them are viewed as a natural part of life and not something to be feared, despaired, or avoided. 3. Develop healthy interpersonal relationships that lead to the alleviation and help prevent the relapse of depression. 4. Develop healthy thinking patterns and beliefs about self, others, and the world that lead to the alleviation and help prevent the relapse of depression. 5. Recognize, accept, and cope with feelings of depression. Diagnosis F33.1  Conditions For Discharge Achievement of treatment goals and objectives   Salomon Fick, LCSW

## 2023-04-27 ENCOUNTER — Ambulatory Visit: Payer: Medicare HMO

## 2023-05-01 ENCOUNTER — Other Ambulatory Visit: Payer: Self-pay | Admitting: Family Medicine

## 2023-05-02 ENCOUNTER — Ambulatory Visit: Payer: Medicare HMO

## 2023-05-03 ENCOUNTER — Ambulatory Visit (INDEPENDENT_AMBULATORY_CARE_PROVIDER_SITE_OTHER): Payer: Medicare HMO | Admitting: Psychology

## 2023-05-03 DIAGNOSIS — F331 Major depressive disorder, recurrent, moderate: Secondary | ICD-10-CM

## 2023-05-03 NOTE — Progress Notes (Signed)
[facility] Counselor/Therapist Progress Note  Patient ID: Rachel Torres, MRN: [id number],    Date: 05/03/2023  Time Spent: 9:00am-9:50am   50 minutes   Treatment Type: Individual Therapy  Reported Symptoms: stress  Mental Status Exam: Appearance:  Casual     Behavior: Appropriate  Motor: Normal  Speech/Language:  Normal Rate  Affect: Appropriate  Mood: normal  Thought process: normal  Thought content:   WNL  Sensory/Perceptual disturbances:   WNL  Orientation: oriented to person, place, time/date, and situation  Attention: Good  Concentration: Good  Memory: WNL  Fund of knowledge:  Good  Insight:   Good  Judgment:  Good  Impulse Control: Good   Risk Assessment: Danger to Self:  No Self-injurious Behavior: No Danger to Others: No Duty to Warn:no Physical Aggression / Violence:No  Access to Firearms a concern: No  Gang Involvement:No   Subjective: Pt present for face-to-face individual therapy via video.  Pt consents to telehealth video session due to COVID 19 pandemic. Location of pt: home Location of therapist: home office.   Pt talked about the recent challenges with Rachel Torres.   He is determined to do things that he should not do unassisted.   He subsequently had a recent fall.  Addressed pt's concerns and care giving strategies.   Worked on coping skills and how pt can adjust Rachel expectations.  Addressed the stress and how difficult it can be to manage Rachel Torres.  Pt talked about Rachel relationship with Rachel Torres.  Pt states Rachel Torres wants to be the center of attention and that gets frustrating for pt.  Helped pt process Rachel feelings and relationship dynamics.   Worked on self care strategies.  Provided supportive therapy.  Interventions: Cognitive Behavioral Therapy and Insight-Oriented  Diagnosis:  F33.1  Plan of Care: Recommend ongoing therapy.   Pt participated in setting treatment goals.   Pt needs a place to talk about stressors.   Plan to meet every two weeks.    Treatment Plan (target date: 03/23/2024) Client Abilities/Strengths  Pt is bright, engaging, and motivated for therapy.   Client Treatment Preferences  Individual therapy.  Client Statement of Needs  Improve coping skills.  Have a place to talk about stressors.   Symptoms  Depressed or irritable mood. Unresolved grief issues.  Problems Addressed  Unipolar Depression Goals 1. Alleviate depressive symptoms and return to previous level of effective functioning. 2. Appropriately grieve the loss in order to normalize mood and to return to previously adaptive level of functioning. Objective Learn and implement behavioral strategies to overcome depression. Target Date: 2024-03-23 Frequency: Biweekly  Progress: 40 Modality: individual  Related Interventions Engage the client in "behavioral activation," increasing his/Rachel activity level and contact with sources of reward, while identifying processes that inhibit activation.  Use behavioral techniques such as instruction, rehearsal, role-playing, role reversal, as needed, to facilitate activity in the client's daily life; reinforce success. Assist the client in developing skills that increase the likelihood of deriving pleasure from behavioral activation (e.g., assertiveness skills, developing an exercise plan, less internal/more external focus, increased social involvement); reinforce success. Objective Identify important people in life, past and present, and describe the quality, good and poor, of those relationships. Target Date: 2024-03-23 Frequency: Biweekly  Progress: 40 Modality: individual  Related Interventions Conduct Interpersonal Therapy beginning with the assessment of the client's "interpersonal inventory" of important past and present relationships; develop a case formulation linking depression to grief, interpersonal role disputes, role transitions, and/or interpersonal deficits).  Objective Learn  and implement problem-solving and decision-making skills. Target Date: 2024-03-23 Frequency: Biweekly  Progress: 40 Modality: individual  Related Interventions Conduct Problem-Solving Therapy using techniques such as psychoeducation, modeling, and role-playing to teach client problem-solving skills (i.e., defining a problem specifically, generating possible solutions, evaluating the pros and cons of each solution, selecting and implementing a plan of action, evaluating the efficacy of the plan, accepting or revising the plan); role-play application of the problem-solving skill to a real life issue. Encourage in the client the development of a positive problem orientation in which problems and solving them are viewed as a natural part of life and not something to be feared, despaired, or avoided. 3. Develop healthy interpersonal relationships that lead to the alleviation and help prevent the relapse of depression. 4. Develop healthy thinking patterns and beliefs about self, others, and the world that lead to the alleviation and help prevent the relapse of depression. 5. Recognize, accept, and cope with feelings of depression. Diagnosis F33.1  Conditions For Discharge Achievement of treatment goals and objectives   Salomon Fick, LCSW

## 2023-05-05 DIAGNOSIS — S62644D Nondisplaced fracture of proximal phalanx of right ring finger, subsequent encounter for fracture with routine healing: Secondary | ICD-10-CM | POA: Diagnosis not present

## 2023-05-09 ENCOUNTER — Ambulatory Visit: Payer: Medicare HMO

## 2023-05-10 ENCOUNTER — Ambulatory Visit: Payer: Medicare HMO | Admitting: Psychology

## 2023-05-11 ENCOUNTER — Ambulatory Visit (INDEPENDENT_AMBULATORY_CARE_PROVIDER_SITE_OTHER): Payer: Medicare HMO | Admitting: Psychology

## 2023-05-11 DIAGNOSIS — F331 Major depressive disorder, recurrent, moderate: Secondary | ICD-10-CM

## 2023-05-11 NOTE — Progress Notes (Signed)
[facility] Counselor/Therapist Progress Note  Patient ID: Rachel Torres, MRN: [id number],    Date: 05/11/2023  Time Spent: 3:00pm-3:50pm  50 minutes   Treatment Type: Individual Therapy  Reported Symptoms: stress  Mental Status Exam: Appearance:  Casual     Behavior: Appropriate  Motor: Normal  Speech/Language:  Normal Rate  Affect: Appropriate  Mood: normal  Thought process: normal  Thought content:   WNL  Sensory/Perceptual disturbances:   WNL  Orientation: oriented to person, place, time/date, and situation  Attention: Good  Concentration: Good  Memory: WNL  Fund of knowledge:  Good  Insight:   Good  Judgment:  Good  Impulse Control: Good   Risk Assessment: Danger to Self:  No Self-injurious Behavior: No Danger to Others: No Duty to Warn:no Physical Aggression / Violence:No  Access to Firearms a concern: No  Gang Involvement:No   Subjective: Pt present for face-to-face individual therapy via video.  Pt consents to telehealth video session due to COVID 19 pandemic. Location of pt: home Location of therapist: home office.   Pt talked about her uncle passing away recently.   Pt was close to him.  Helped pt process her feelings and grief.  [name] uncle is her mother's brother so pt's mother is very upset bc she is the only one left in her family now.  Pt is trying to be a support to her mother as well.  Pt talked about the recent challenges with [name].   [name] care giver is going to be out for a week.  Addressed pt's concerns and care giving strategies.   Worked on coping skills and how pt can adjust her expectations.  Addressed the stress and how difficult it can be to manage [name] as he declines.  Worked on self care strategies.  Provided supportive therapy.  Interventions: Cognitive Behavioral Therapy and Insight-Oriented  Diagnosis:  F33.1  Plan of Care: Recommend ongoing therapy.   Pt participated in setting treatment goals.   Pt needs a place  to talk about stressors.  Plan to meet every two weeks.    Treatment Plan (target date: 03/23/2024) Client Abilities/Strengths  Pt is bright, engaging, and motivated for therapy.   Client Treatment Preferences  Individual therapy.  Client Statement of Needs  Improve coping skills.  Have a place to talk about stressors.   Symptoms  Depressed or irritable mood. Unresolved grief issues.  Problems Addressed  Unipolar Depression Goals 1. Alleviate depressive symptoms and return to previous level of effective functioning. 2. Appropriately grieve the loss in order to normalize mood and to return to previously adaptive level of functioning. Objective Learn and implement behavioral strategies to overcome depression. Target Date: 2024-03-23 Frequency: Biweekly  Progress: 40 Modality: individual  Related Interventions Engage the client in "behavioral activation," increasing his/her activity level and contact with sources of reward, while identifying processes that inhibit activation.  Use behavioral techniques such as instruction, rehearsal, role-playing, role reversal, as needed, to facilitate activity in the client's daily life; reinforce success. Assist the client in developing skills that increase the likelihood of deriving pleasure from behavioral activation (e.g., assertiveness skills, developing an exercise plan, less internal/more external focus, increased social involvement); reinforce success. Objective Identify important people in life, past and present, and describe the quality, good and poor, of those relationships. Target Date: 2024-03-23 Frequency: Biweekly  Progress: 40 Modality: individual  Related Interventions Conduct Interpersonal Therapy beginning with the assessment of the client's "interpersonal inventory" of important past and present relationships; develop a  case formulation linking depression to grief, interpersonal role disputes, role transitions, and/or interpersonal  deficits). Objective Learn and implement problem-solving and decision-making skills. Target Date: 2024-03-23 Frequency: Biweekly  Progress: 40 Modality: individual  Related Interventions Conduct Problem-Solving Therapy using techniques such as psychoeducation, modeling, and role-playing to teach client problem-solving skills (i.e., defining a problem specifically, generating possible solutions, evaluating the pros and cons of each solution, selecting and implementing a plan of action, evaluating the efficacy of the plan, accepting or revising the plan); role-play application of the problem-solving skill to a real life issue. Encourage in the client the development of a positive problem orientation in which problems and solving them are viewed as a natural part of life and not something to be feared, despaired, or avoided. 3. Develop healthy interpersonal relationships that lead to the alleviation and help prevent the relapse of depression. 4. Develop healthy thinking patterns and beliefs about self, others, and the world that lead to the alleviation and help prevent the relapse of depression. 5. Recognize, accept, and cope with feelings of depression. Diagnosis F33.1  Conditions For Discharge Achievement of treatment goals and objectives   Salomon Fick, LCSW

## 2023-05-16 NOTE — Progress Notes (Unsigned)
Electrophysiology Office Note:    Date:  05/18/2023   ID:  Rachel Torres, DOB 1956/01/24, MRN [id number]  [facility] Cardiologist:  Julien Nordmann, MD  [facility] Electrophysiologist:  Lanier Prude, MD   Referring MD: Charlsie Quest, NP   Chief Complaint: Atrial fibrillation  History of Present Illness:    Rachel Torres is a 67 y.o. female who I am seeing today for an evaluation of paroxysmal atrial fibrillation at the request of Sheri hammock.  The patient last saw [facility] March 18, 2023.  The patient has a history of type a aortic dissection in September 2011, aortic valve replacement, coronary artery disease post CABG, chronic systolic heart failure (now recovered ejection fraction), obstructive sleep apnea, rheumatoid arthritis, chronic shortness of breath, hypertension, hyperlipidemia.  She was seen in the ER at [facility] on March 09, 2023 for palpitation was found to be in atrial fibrillation.  She was put on Eliquis.  She had a TEE guided cardioversion on March 17, 2023.  She is symptomatic while in atrial fibrillation with fatigue.  Office note from March 15, 2023 at [facility] reports atrial fibrillation with a ventricular rate greater than 100 bpm.  She tells me that she has not had an episode of atrial fibrillation after her recent cardioversion.  She continues to take Eliquis twice daily for stroke prophylaxis.  She is tolerating the increased dose of Coreg (12.5 mg by mouth twice daily).    Their past medical, social and family history was reveiwed.   ROS:   Please see the history of present illness.    All other systems reviewed and are negative.  EKGs/Labs/Other Studies Reviewed:    The following studies were reviewed today:  September 23, 2022 echo EF 45 to 50% Global hypokinesis RV normal Mild to moderate MR Bioprosthetic aortic valve   March 18, 2023 EKG shows sinus rhythm January 25, 2023 EKG shows sinus rhythm  EKG:  The ekg ordered today  demonstrates sinus rhythm with PVCs.   Physical Exam:    VS:  BP 120/84   Pulse 76   Ht 5\' 5"  (1.651 m)   Wt 216 lb (98 kg)   SpO2 96%   BMI 35.94 kg/m     Wt Readings from Last 3 Encounters:  05/18/23 216 lb (98 kg)  03/18/23 217 lb 3.2 oz (98.5 kg)  02/17/23 205 lb (93 kg)     GEN:  Well nourished, well developed in no acute distress CARDIAC: RRR, no murmurs, rubs, gallops RESPIRATORY:  Clear to auscultation without rales, wheezing or rhonchi       ASSESSMENT AND PLAN:    1. PAF (paroxysmal atrial fibrillation) (HCC)   2. Heart failure with improved ejection fraction (HFimpEF) (HCC)   3. Coronary artery disease of native artery of native heart with stable angina pectoris (HCC)   4. S/P AVR (aortic valve replacement)     #Persistent atrial fibrillation Symptomatic.  Required recent cardioversion.  On Eliquis for stroke prophylaxis. S/p ablation in 1999. We discussed her recent episode of atrial fibrillation and treatment options moving forward.  We discussed using antiarrhythmic drugs, continuing with a conservative management strategy or pursuing redo catheter ablation.  I think any of the 3 would be reasonable.  After discussion we decided to continue with a watchful waiting approach.  If she has recurrence, would favor catheter ablation over addition of antiarrhythmic drugs.  For now, continue Eliquis.    #Chronic systolic heart failure Ejection fraction now improved.  NYHA class II.  Warm dry on exam.  Continue GDMT.  #Coronary artery disease post CABG #AVR No ischemic symptoms today.  Continue Lipitor and Zetia.  Continue Imdur.  Follow-up 6 months with APP         Signed, Sheria Lang T. Lalla Brothers, MD, [facility], [facility] 05/18/2023 1:26 PM    Electrophysiology  [facility]

## 2023-05-18 ENCOUNTER — Ambulatory Visit: Payer: Medicare HMO | Attending: Cardiology | Admitting: Cardiology

## 2023-05-18 ENCOUNTER — Ambulatory Visit (INDEPENDENT_AMBULATORY_CARE_PROVIDER_SITE_OTHER): Payer: Medicare HMO | Admitting: Psychology

## 2023-05-18 ENCOUNTER — Encounter: Payer: Self-pay | Admitting: Cardiology

## 2023-05-18 VITALS — BP 120/84 | HR 76 | Ht 65.0 in | Wt 216.0 lb

## 2023-05-18 DIAGNOSIS — I5032 Chronic diastolic (congestive) heart failure: Secondary | ICD-10-CM

## 2023-05-18 DIAGNOSIS — I25118 Atherosclerotic heart disease of native coronary artery with other forms of angina pectoris: Secondary | ICD-10-CM

## 2023-05-18 DIAGNOSIS — I480 Paroxysmal atrial fibrillation: Secondary | ICD-10-CM

## 2023-05-18 DIAGNOSIS — F331 Major depressive disorder, recurrent, moderate: Secondary | ICD-10-CM

## 2023-05-18 DIAGNOSIS — Z952 Presence of prosthetic heart valve: Secondary | ICD-10-CM

## 2023-05-18 NOTE — Progress Notes (Signed)
[facility] Counselor/Therapist Progress Note  Patient ID: Rachel Torres, MRN: [id number],    Date: 05/18/2023  Time Spent: 12:00pm-12:55pm  55 minutes   Treatment Type: Individual Therapy  Reported Symptoms: stress  Mental Status Exam: Appearance:  Casual     Behavior: Appropriate  Motor: Normal  Speech/Language:  Normal Rate  Affect: Appropriate  Mood: normal  Thought process: normal  Thought content:   WNL  Sensory/Perceptual disturbances:   WNL  Orientation: oriented to person, place, time/date, and situation  Attention: Good  Concentration: Good  Memory: WNL  Fund of knowledge:  Good  Insight:   Good  Judgment:  Good  Impulse Control: Good   Risk Assessment: Danger to Self:  No Self-injurious Behavior: No Danger to Others: No Duty to Warn:no Physical Aggression / Violence:No  Access to Firearms a concern: No  Gang Involvement:No   Subjective: Pt present for face-to-face individual therapy via video.  Pt consents to telehealth video session due to COVID 19 pandemic. Location of pt: home Location of therapist: home office.   Pt talked about going to her uncle's funeral.  Her son surprised her by being there with his wife and new baby.   Pt was glad to see her family but also sad about the loss of her uncle.  Helped pt process her feelings.   Pt talked about the recent challenges with [name].  [name] has fallen a couple of times this past week.   Addressed pt's concerns and care giving strategies.    Addressed the stress and how difficult it can be to manage [name] as he declines.  Pt talked about her relationship with her mother.   She states her mother is needy and contacts pt several times a day.   Worked on self care strategies.  Provided supportive therapy.  Interventions: Cognitive Behavioral Therapy and Insight-Oriented  Diagnosis:  F33.1  Plan of Care: Recommend ongoing therapy.   Pt participated in setting treatment goals.   Pt needs a  place to talk about stressors.  Plan to meet every two weeks.    Treatment Plan (target date: 03/23/2024) Client Abilities/Strengths  Pt is bright, engaging, and motivated for therapy.   Client Treatment Preferences  Individual therapy.  Client Statement of Needs  Improve coping skills.  Have a place to talk about stressors.   Symptoms  Depressed or irritable mood. Unresolved grief issues.  Problems Addressed  Unipolar Depression Goals 1. Alleviate depressive symptoms and return to previous level of effective functioning. 2. Appropriately grieve the loss in order to normalize mood and to return to previously adaptive level of functioning. Objective Learn and implement behavioral strategies to overcome depression. Target Date: 2024-03-23 Frequency: Biweekly  Progress: 40 Modality: individual  Related Interventions Engage the client in "behavioral activation," increasing his/her activity level and contact with sources of reward, while identifying processes that inhibit activation.  Use behavioral techniques such as instruction, rehearsal, role-playing, role reversal, as needed, to facilitate activity in the client's daily life; reinforce success. Assist the client in developing skills that increase the likelihood of deriving pleasure from behavioral activation (e.g., assertiveness skills, developing an exercise plan, less internal/more external focus, increased social involvement); reinforce success. Objective Identify important people in life, past and present, and describe the quality, good and poor, of those relationships. Target Date: 2024-03-23 Frequency: Biweekly  Progress: 40 Modality: individual  Related Interventions Conduct Interpersonal Therapy beginning with the assessment of the client's "interpersonal inventory" of important past and present relationships; develop a  case formulation linking depression to grief, interpersonal role disputes, role transitions, and/or  interpersonal deficits). Objective Learn and implement problem-solving and decision-making skills. Target Date: 2024-03-23 Frequency: Biweekly  Progress: 40 Modality: individual  Related Interventions Conduct Problem-Solving Therapy using techniques such as psychoeducation, modeling, and role-playing to teach client problem-solving skills (i.e., defining a problem specifically, generating possible solutions, evaluating the pros and cons of each solution, selecting and implementing a plan of action, evaluating the efficacy of the plan, accepting or revising the plan); role-play application of the problem-solving skill to a real life issue. Encourage in the client the development of a positive problem orientation in which problems and solving them are viewed as a natural part of life and not something to be feared, despaired, or avoided. 3. Develop healthy interpersonal relationships that lead to the alleviation and help prevent the relapse of depression. 4. Develop healthy thinking patterns and beliefs about self, others, and the world that lead to the alleviation and help prevent the relapse of depression. 5. Recognize, accept, and cope with feelings of depression. Diagnosis F33.1  Conditions For Discharge Achievement of treatment goals and objectives   Salomon Fick, LCSW                Leslyn Monda [place], LCSW

## 2023-05-18 NOTE — Patient Instructions (Addendum)
Medication Instructions:  Your physician recommends that you continue on your current medications as directed. Please refer to the Current Medication list given to you today.  *If you need a refill on your cardiac medications before your next appointment, please call your pharmacy*  Follow-Up: At [facility], you and your health needs are our priority.  As part of our continuing mission to provide you with exceptional heart care, we have created designated Provider Care Teams.  These Care Teams include your primary Cardiologist (physician) and Advanced Practice Providers (APPs -  Physician Assistants and Nurse Practitioners) who all work together to provide you with the care you need, when you need it.  Your next appointment:   6 month(s)  Provider:   Sherie Don, NP  Then, Lanier Prude, MD will plan to see you again in 1 year(s).

## 2023-05-24 ENCOUNTER — Ambulatory Visit (INDEPENDENT_AMBULATORY_CARE_PROVIDER_SITE_OTHER): Payer: Medicare HMO | Admitting: Psychology

## 2023-05-24 DIAGNOSIS — F331 Major depressive disorder, recurrent, moderate: Secondary | ICD-10-CM | POA: Diagnosis not present

## 2023-05-24 NOTE — Progress Notes (Signed)
[facility] Counselor/Therapist Progress Note  Patient ID: Rachel Torres, MRN: [id number],    Date: 05/24/2023  Time Spent: 10:00am-10:50am  50 minutes   Treatment Type: Individual Therapy  Reported Symptoms: stress  Mental Status Exam: Appearance:  Casual     Behavior: Appropriate  Motor: Normal  Speech/Language:  Normal Rate  Affect: Appropriate  Mood: normal  Thought process: normal  Thought content:   WNL  Sensory/Perceptual disturbances:   WNL  Orientation: oriented to person, place, time/date, and situation  Attention: Good  Concentration: Good  Memory: WNL  Fund of knowledge:  Good  Insight:   Good  Judgment:  Good  Impulse Control: Good   Risk Assessment: Danger to Self:  No Self-injurious Behavior: No Danger to Others: No Duty to Warn:no Physical Aggression / Violence:No  Access to Firearms a concern: No  Gang Involvement:No   Subjective: Pt present for face-to-face individual therapy via video.  Pt consents to telehealth video session due to COVID 19 pandemic. Location of pt: home Location of therapist: home office.   Pt talked about preparing for her trip to see her new grandbaby.   She is looking forward to seeing the baby and her son.   One of [name] caregivers is leaving which is sad for them.   Pt talked about the recent challenges with [name].   Addressed pt's concerns and care giving strategies.    Addressed the stress and how difficult it can be to manage [name] as he declines.   Worked on self care strategies.  Provided supportive therapy.  Interventions: Cognitive Behavioral Therapy and Insight-Oriented  Diagnosis:  F33.1  Plan of Care: Recommend ongoing therapy.   Pt participated in setting treatment goals.   Pt needs a place to talk about stressors.  Plan to meet every two weeks.    Treatment Plan (target date: 03/23/2024) Client Abilities/Strengths  Pt is bright, engaging, and motivated for therapy.   Client Treatment  Preferences  Individual therapy.  Client Statement of Needs  Improve coping skills.  Have a place to talk about stressors.   Symptoms  Depressed or irritable mood. Unresolved grief issues.  Problems Addressed  Unipolar Depression Goals 1. Alleviate depressive symptoms and return to previous level of effective functioning. 2. Appropriately grieve the loss in order to normalize mood and to return to previously adaptive level of functioning. Objective Learn and implement behavioral strategies to overcome depression. Target Date: 2024-03-23 Frequency: Biweekly  Progress: 40 Modality: individual  Related Interventions Engage the client in "behavioral activation," increasing his/her activity level and contact with sources of reward, while identifying processes that inhibit activation.  Use behavioral techniques such as instruction, rehearsal, role-playing, role reversal, as needed, to facilitate activity in the client's daily life; reinforce success. Assist the client in developing skills that increase the likelihood of deriving pleasure from behavioral activation (e.g., assertiveness skills, developing an exercise plan, less internal/more external focus, increased social involvement); reinforce success. Objective Identify important people in life, past and present, and describe the quality, good and poor, of those relationships. Target Date: 2024-03-23 Frequency: Biweekly  Progress: 40 Modality: individual  Related Interventions Conduct Interpersonal Therapy beginning with the assessment of the client's "interpersonal inventory" of important past and present relationships; develop a case formulation linking depression to grief, interpersonal role disputes, role transitions, and/or interpersonal deficits). Objective Learn and implement problem-solving and decision-making skills. Target Date: 2024-03-23 Frequency: Biweekly  Progress: 40 Modality: individual  Related Interventions Conduct  Problem-Solving Therapy using techniques such as  psychoeducation, modeling, and role-playing to teach client problem-solving skills (i.e., defining a problem specifically, generating possible solutions, evaluating the pros and cons of each solution, selecting and implementing a plan of action, evaluating the efficacy of the plan, accepting or revising the plan); role-play application of the problem-solving skill to a real life issue. Encourage in the client the development of a positive problem orientation in which problems and solving them are viewed as a natural part of life and not something to be feared, despaired, or avoided. 3. Develop healthy interpersonal relationships that lead to the alleviation and help prevent the relapse of depression. 4. Develop healthy thinking patterns and beliefs about self, others, and the world that lead to the alleviation and help prevent the relapse of depression. 5. Recognize, accept, and cope with feelings of depression. Diagnosis F33.1  Conditions For Discharge Achievement of treatment goals and objectives   Salomon Fick, LCSW

## 2023-05-31 ENCOUNTER — Ambulatory Visit (INDEPENDENT_AMBULATORY_CARE_PROVIDER_SITE_OTHER): Payer: Medicare HMO | Admitting: Psychology

## 2023-05-31 DIAGNOSIS — F331 Major depressive disorder, recurrent, moderate: Secondary | ICD-10-CM | POA: Diagnosis not present

## 2023-05-31 NOTE — Progress Notes (Signed)
[facility] Counselor/Therapist Progress Note  Patient ID: Rachel Torres, MRN: [id number],    Date: 05/31/2023  Time Spent: 9:00am-9:55am  55 minutes   Treatment Type: Individual Therapy  Reported Symptoms: stress  Mental Status Exam: Appearance:  Casual     Behavior: Appropriate  Motor: Normal  Speech/Language:  Normal Rate  Affect: Appropriate  Mood: normal  Thought process: normal  Thought content:   WNL  Sensory/Perceptual disturbances:   WNL  Orientation: oriented to person, place, time/date, and situation  Attention: Good  Concentration: Good  Memory: WNL  Fund of knowledge:  Good  Insight:   Good  Judgment:  Good  Impulse Control: Good   Risk Assessment: Danger to Self:  No Self-injurious Behavior: No Danger to Others: No Duty to Warn:no Physical Aggression / Violence:No  Access to Firearms a concern: No  Gang Involvement:No   Subjective: Pt present for face-to-face individual therapy via video.  Pt consents to telehealth video session due to COVID 19 pandemic. Location of pt: home Location of therapist: home office.   Pt talked about her trip to see her new grandbaby.   She had a good trip spending time with her son and his wife and their baby.   Pt talked about her health.   She is having issues with numbness in her leg and she continues to have shoulder pain.  Pt has doctors appointments this week and next week.  Addressed pt's health concerns.  Pt talked about the recent challenges with Rachel Torres.   Rachel Torres worries about Torres.   He has not had a fall in a couple of weeks.  Rachel Torres has been more sad lately and more angry at times.  He has an appointment with his psychiatrist.   Addressed pt's concerns and care giving strategies.    Addressed the stress and how difficult it can be to manage Rachel Torres as he declines.   Worked on self care strategies.  Provided supportive therapy.  Interventions: Cognitive Behavioral Therapy and  Insight-Oriented  Diagnosis:  F33.1  Plan of Care: Recommend ongoing therapy.   Pt participated in setting treatment goals.   Pt needs a place to talk about stressors.  Plan to meet every two weeks.    Treatment Plan (target date: 03/23/2024) Client Abilities/Strengths  Pt is bright, engaging, and motivated for therapy.   Client Treatment Preferences  Individual therapy.  Client Statement of Needs  Improve coping skills.  Have a place to talk about stressors.   Symptoms  Depressed or irritable mood. Unresolved grief issues.  Problems Addressed  Unipolar Depression Goals 1. Alleviate depressive symptoms and return to previous level of effective functioning. 2. Appropriately grieve the loss in order to normalize mood and to return to previously adaptive level of functioning. Objective Learn and implement behavioral strategies to overcome depression. Target Date: 2024-03-23 Frequency: Biweekly  Progress: 40 Modality: individual  Related Interventions Engage the client in "behavioral activation," increasing his/her activity level and contact with sources of reward, while identifying processes that inhibit activation.  Use behavioral techniques such as instruction, rehearsal, role-playing, role reversal, as needed, to facilitate activity in the client's daily life; reinforce success. Assist the client in developing skills that increase the likelihood of deriving pleasure from behavioral activation (e.g., assertiveness skills, developing an exercise plan, less internal/more external focus, increased social involvement); reinforce success. Objective Identify important people in life, past and present, and describe the quality, good and poor, of those relationships. Target Date: 2024-03-23 Frequency: Biweekly  Progress: 40  Modality: individual  Related Interventions Conduct Interpersonal Therapy beginning with the assessment of the client's "interpersonal inventory" of important past and  present relationships; develop a case formulation linking depression to grief, interpersonal role disputes, role transitions, and/or interpersonal deficits). Objective Learn and implement problem-solving and decision-making skills. Target Date: 2024-03-23 Frequency: Biweekly  Progress: 40 Modality: individual  Related Interventions Conduct Problem-Solving Therapy using techniques such as psychoeducation, modeling, and role-playing to teach client problem-solving skills (i.e., defining a problem specifically, generating possible solutions, evaluating the pros and cons of each solution, selecting and implementing a plan of action, evaluating the efficacy of the plan, accepting or revising the plan); role-play application of the problem-solving skill to a real life issue. Encourage in the client the development of a positive problem orientation in which problems and solving them are viewed as a natural part of life and not something to be feared, despaired, or avoided. 3. Develop healthy interpersonal relationships that lead to the alleviation and help prevent the relapse of depression. 4. Develop healthy thinking patterns and beliefs about self, others, and the world that lead to the alleviation and help prevent the relapse of depression. 5. Recognize, accept, and cope with feelings of depression. Diagnosis F33.1  Conditions For Discharge Achievement of treatment goals and objectives   Salomon Fick, LCSW

## 2023-06-02 ENCOUNTER — Telehealth: Payer: Self-pay | Admitting: Pharmacist

## 2023-06-02 NOTE — Progress Notes (Signed)
Called patient to cancel appointment tomorrow with Mardella Layman. Left voicemail noting that Lindsey's appointment is canceled but she is still scheduled to see Dr. Milinda Antis at noon.   Notified to call me if any medication related questions or concerns. Sending MyChart.   Catie Eppie Gibson, PharmD, BCACP, CPP [facility] [phone]

## 2023-06-03 ENCOUNTER — Ambulatory Visit (INDEPENDENT_AMBULATORY_CARE_PROVIDER_SITE_OTHER): Payer: Medicare HMO | Admitting: Family Medicine

## 2023-06-03 ENCOUNTER — Encounter: Payer: Medicare HMO | Admitting: Pharmacist

## 2023-06-03 VITALS — BP 114/66 | HR 81 | Temp 97.9°F | Ht 65.0 in | Wt 218.5 lb

## 2023-06-03 DIAGNOSIS — R202 Paresthesia of skin: Secondary | ICD-10-CM | POA: Diagnosis not present

## 2023-06-03 MED ORDER — CETIRIZINE HCL 10 MG PO TABS: 10.0000 mg | ORAL_TABLET | Freq: Every day | ORAL | 3 refills | Status: AC | PRN

## 2023-06-03 MED ORDER — ALPRAZOLAM 0.5 MG PO TABS: 0.5000 mg | ORAL_TABLET | Freq: Every day | ORAL | 0 refills | Status: DC | PRN

## 2023-06-03 MED ORDER — FLUTICASONE PROPIONATE 50 MCG/ACT NA SUSP: 2.0000 | Freq: Every day | NASAL | 3 refills | Status: DC | PRN

## 2023-06-03 NOTE — Patient Instructions (Addendum)
Your numbness may be from your back or another problem called meralgia paresthetica   Walking may help  Weight loss may help  Don't wear tight clothing   If symptoms worsen or if you develop weakness in the leg let us know  Also if pain goes down to the leg below the knee

## 2023-06-03 NOTE — Progress Notes (Signed)
Subjective:    Patient ID: Rachel Torres, female    DOB: 07-Mar-1956, 67 y.o.   MRN: [id number]  HPI Pt presents for c/o L leg pain and numbness  Also med refills : xanax, flonase and zyrtec   Wt Readings from Last 3 Encounters:  06/03/23 218 lb 8 oz (99.1 kg)  05/18/23 216 lb (98 kg)  03/18/23 217 lb 3.2 oz (98.5 kg)   36.36 kg/m  Vitals:   06/03/23 1155  BP: 114/66  Pulse: 81  Temp: 97.9 F (36.6 C)  SpO2: 97%   Left leg issue Left leg goes numb from the lateral hip to knee- not below knee  A little discomfort-not much  ? If sciatic  Back is stable - about the same/ stable and always hurts Did some PT   No foot drop   Is seeing surgeon about her shoulder    Is fairly active  No new activity   Some mild /occ urinary incontinence No stool changes     Has some LS DDD and also facet arthropathy at more than one level  Last MRI was 2016     Some problems swallowing-choking more than she ever has    Patient Active Problem List   Diagnosis Date Noted   Paresthesia 06/03/2023   BMI 34.0-34.9,adult 02/17/2023   Obesity, Beginning BMI 35.45 02/17/2023   Tinnitus 01/14/2023   Change in vision 01/14/2023   Colon cancer screening 01/14/2023   Personal history of fall 01/14/2023   Problem related to unspecified psychosocial circumstances 12/28/2022   Person encountering health services to consult on behalf of another person 12/28/2022   Palpitation 12/10/2022   Fatigue 10/26/2022   B12 deficiency 10/26/2022   Right shoulder pain 09/15/2022   Depression 09/14/2022   Class 2 severe obesity with serious comorbidity and body mass index (BMI) of 35.0 to 35.9 in adult [facility]) 09/14/2022   Other constipation 05/12/2022   Medicare annual wellness visit, initial 07/17/2021   TMJ (dislocation of temporomandibular joint) 07/17/2021   Encounter for screening mammogram for breast cancer 06/05/2021   Diarrhea 04/11/2020   Epigastric pain 02/19/2020   Dark stools  02/19/2020   History of atrial fibrillation 02/05/2019   Shortness of breath 01/27/2019   Irregular surface of cornea 12/25/2018   HPV (human papilloma virus) infection 12/01/2016   Screening mammogram, encounter for 11/29/2016   Estrogen deficiency 11/29/2016   Encounter for routine gynecological examination 11/29/2016   Grade III hemorrhoids 10/06/2016   Tachycardia 08/14/2016   Hemorrhoids 08/13/2016   Atypical migraine 08/03/2016   CAD (coronary artery disease)    AAA (abdominal aortic aneurysm) (HCC)    Chronic combined systolic (congestive) and diastolic (congestive) heart failure (HCC)    Prediabetes 02/11/2016   Generalized anxiety disorder 12/08/2015   Panic attacks 07/28/2015   Chest pain 07/09/2013   Sensorineural hearing loss 03/27/2013   Vitamin D deficiency 07/03/2012   H/O aortic dissection 05/12/2012   Routine general medical examination at a health care facility 09/20/2011   Atypical chest pain 09/15/2011   S/P AVR (aortic valve replacement) 01/07/2011   CORONARY ARTERY BYPASS GRAFT, HX OF 01/07/2011   Arthritis 12/20/2010   Back pain 12/15/2010   H/O dissecting abdominal aortic aneurysm repair 08/24/2010   IRRITABLE BOWEL SYNDROME 09/09/2009   Obstructive sleep apnea 08/04/2009   External hemorrhoid 06/26/2009   Osteoporosis 01/09/2009   Hyperlipidemia 07/26/2007   Depression with anxiety 07/26/2007   Essential hypertension 07/26/2007   Allergic rhinitis 07/26/2007  Asthma 07/26/2007   GERD 07/26/2007   Osteoarthritis 07/26/2007   Ocular migraine 07/26/2007   Mild intermittent asthma without complication 07/26/2007   Past Medical History:  Diagnosis Date   AAA (abdominal aortic aneurysm) (HCC)    a. Chronic w/o evidence of aneurysmal dil on CTA 01/2016.   Alcohol abuse    Allergy    Anemia    Anxiety    Asthma    CAD (coronary artery disease)    a. 08/2010 s/p CABG x 1 (VG->RCA) @ time of Ao dissection repair; b. 01/2016 Lexiscan MV: mid  antsept/apical defect w/ ? peri-infarct ischemia-->likely attenuation-->Med Rx.   Chewing difficulty    Chronic combined systolic (congestive) and diastolic (congestive) heart failure (HCC)    a. 2012 EF 30-35%; b. 04/2015 EF 40-45%; c. 01/2016 Echo: Ef 55-65%, nl AoV bioprosthesis, nl RV, nl PASP.   Constipation    Depression    Edema of both lower extremities    Gallbladder sludge    GERD (gastroesophageal reflux disease)    Heart failure (HCC)    Heart valve problem    History of Bicuspid Aortic Valve    a. 08/2010 s/p AVR @ time of Ao dissection repair; b. 01/2016 Echo: Ef 55-65%, nl AoV bioprosthesis, nl RV, nl PASP.   Hx of repair of dissecting thoracic aortic aneurysm, Stanford type A    a. 08/2010 s/p repair with AVR and VG->RCA; b. 01/2016 CTA: stable appearance of Asc Thoracic Aortic graft. Opacification of flase lumen of chronic abd Ao dissection w/ retrograde flow through lumbar arteries. No aneurysmal dil of flase lumen.   Hyperlipidemia    Hypertension    Hypertensive heart disease    IBS (irritable bowel syndrome)    Internal hemorrhoids    Kidney problem    Marfan syndrome    pt denies   Migraine    Obstructive sleep apnea    Osteoarthritis    Osteoporosis    Palpitations    Pneumonia    RA (rheumatoid arthritis) (HCC)    a. Followed by Dr. Mallie Mussel   Past Surgical History:  Procedure Laterality Date   ABDOMINAL AORTIC ANEURYSM REPAIR     CARDIAC CATHETERIZATION  2001   CESAREAN SECTION  1986 & 1991   CHOLECYSTECTOMY     COLONOSCOPY     ESOPHAGOGASTRODUODENOSCOPY (EGD) WITH PROPOFOL N/A 05/08/2020   Procedure: ESOPHAGOGASTRODUODENOSCOPY (EGD) WITH PROPOFOL;  Surgeon: Pasty Spillers, MD;  Location: [facility] ENDOSCOPY;  Service: Endoscopy;  Laterality: N/A;   FRACTURE SURGERY Right    shoulder replacement   HEMORRHOID BANDING     ORIF DISTAL RADIUS FRACTURE Left    UPPER GASTROINTESTINAL ENDOSCOPY     Social History   Tobacco Use   Smoking status: Former     Packs/day: 0.75    Years: 1.50    Additional pack years: 0.00    Total pack years: 1.13    Types: Cigarettes    Quit date: 12/20/1978    Years since quitting: 44.4   Smokeless tobacco: Never  Vaping Use   Vaping Use: Never used  Substance Use Topics   Alcohol use: Yes    Comment: 2-3 glasses of red wine per month   Drug use: No   Family History  Problem Relation Age of Onset   Hypertension Mother    Depression Mother    Osteoporosis Mother    Stroke Mother    Irritable bowel syndrome Mother    Asthma Mother    Heart disease Mother  Thyroid disease Mother    Anxiety disorder Mother    Other Father 50   Arthritis Sister    Diabetes Sister    Heart disease Sister    Asthma Sister    Migraines Sister    Diabetes Sister    Nephrolithiasis Sister    Osteoporosis Sister    Arthritis Sister    Asthma Sister    Migraines Sister    Diabetes Brother    Other Brother        prediabeties   Asthma Brother    Migraines Brother    Stroke Maternal Grandmother    Colon cancer Maternal Grandmother    Lymphoma Maternal Grandmother    Migraines Maternal Grandmother    Lung cancer Maternal Grandfather    Melanoma Maternal Grandfather    Aneurysm Paternal Grandmother        brain   Diabetes Paternal Grandmother    Arthritis Paternal Grandmother    Arthritis Paternal Grandfather    Diabetes Paternal Grandfather    Allergies  Allergen Reactions   Fosamax [Alendronate Sodium]     GI upset    Ginzing [Ginseng] Other (See Comments)    Hyper and jitery    Hydrocod Poli-Chlorphe Poli Er Itching   Hydrocodone-Acetaminophen Itching   Imitrex [Sumatriptan]     Contraindicated w/ heart condition   Oxycodone Itching   Peanut-Containing Drug Products Other (See Comments)    Reaction:  Migraines    Tramadol Other (See Comments)    itching   Hydrocodone-Guaifenesin Itching   Prednisone Rash   Tape Itching   Current Outpatient Medications on File Prior to Visit  Medication Sig  Dispense Refill   acetaminophen (TYLENOL) 500 MG tablet Take 500 mg by mouth every 6 (six) hours as needed.     albuterol (VENTOLIN HFA) 108 (90 Base) MCG/ACT inhaler Inhale 1-2 puffs into the lungs every 6 (six) hours as needed for wheezing or shortness of breath. 3 each 3   aspirin EC 81 MG tablet Take 81 mg by mouth at bedtime.     atorvastatin (LIPITOR) 20 MG tablet Take 20 mg by mouth. Four days per week     carvedilol (COREG) 12.5 MG tablet Take 1 tablet (12.5 mg total) by mouth 2 (two) times daily. 180 tablet 3   cetirizine (ZYRTEC) 10 MG tablet Take 10 mg by mouth daily.     Cholecalciferol (VITAMIN D3) 50 MCG (2000 UT) CAPS Take 2,000 capsules by mouth. FOUR TIMES A WEEK     cyclobenzaprine (FLEXERIL) 10 MG tablet Take 0.5-1 tablets (5-10 mg total) by mouth 3 (three) times daily as needed. For headache 90 tablet 3   ELIQUIS 5 MG TABS tablet Take 5 mg by mouth 2 (two) times daily.     ezetimibe (ZETIA) 10 MG tablet Take 1 tablet (10 mg total) by mouth daily. 90 tablet 3   furosemide (LASIX) 20 MG tablet Take 40 mg (2 tablets) one day and the next day 20 mg, alternating daily (Patient taking differently: Take 40 mg by mouth daily.) 144 tablet 2   hydrOXYzine (ATARAX) 25 MG tablet Take 25 mg by mouth 3 (three) times daily.     isosorbide mononitrate (IMDUR) 30 MG 24 hr tablet Take 0.5 tablets (15 mg total) by mouth daily. 45 tablet 3   lisinopril (ZESTRIL) 5 MG tablet Take 2.5 mg by mouth daily.     magnesium gluconate (MAGONATE) 500 MG tablet Take 500 mg by mouth at bedtime.     Melatonin  5 MG SUBL Take 10 mg by mouth at bedtime as needed.     nitroGLYCERIN (NITROSTAT) 0.4 MG SL tablet Place 1 tablet (0.4 mg total) under the tongue every 5 (five) minutes as needed for chest pain. 25 tablet 3   omeprazole (PRILOSEC) 40 MG capsule TAKE 1 CAPSULE EVERY DAY 90 capsule 2   polyethylene glycol powder (GLYCOLAX/MIRALAX) 17 GM/SCOOP powder Take 17 g by mouth as needed. 255 g 0   potassium chloride  (KLOR-CON M) 10 MEQ tablet Take 10 mEq by mouth 2 (two) times daily.     promethazine (PHENERGAN) 25 MG tablet Take 0.5 tablets (12.5 mg total) by mouth every 8 (eight) hours as needed for nausea. 90 tablet 3   TART CHERRY PO Take 1 capsule by mouth 2 (two) times daily.     venlafaxine XR (EFFEXOR-XR) 150 MG 24 hr capsule TAKE 1 CAPSULE EVERY DAY WITH BREAKFAST 7 capsule 0   venlafaxine XR (EFFEXOR-XR) 37.5 MG 24 hr capsule TAKE 1 CAPSULE (37.5 MG TOTAL) BY MOUTH DAILY WITH BREAKFAST. 90 capsule 2   topiramate (TOPAMAX) 50 MG tablet Take 1 tablet (50 mg total) by mouth at bedtime. (Patient not taking: Reported on 06/03/2023) 30 tablet 0   No current facility-administered medications on file prior to visit.    Review of Systems  Constitutional:  Positive for fatigue.  Gastrointestinal:        No bowel incontinence   Genitourinary:        Mild urinary incontinence Not new    Musculoskeletal:  Positive for back pain.  Neurological:  Positive for numbness. Negative for tremors, syncope, facial asymmetry, weakness and headaches.       No foot drop  Psychiatric/Behavioral:  The patient is nervous/anxious.        Stressors        Objective:   Physical Exam Constitutional:      General: She is not in acute distress.    Appearance: Normal appearance. She is well-developed. She is obese. She is not ill-appearing or diaphoretic.  HENT:     Head: Normocephalic and atraumatic.  Eyes:     General: No scleral icterus.    Conjunctiva/sclera: Conjunctivae normal.     Pupils: Pupils are equal, round, and reactive to light.  Cardiovascular:     Rate and Rhythm: Normal rate and regular rhythm.  Pulmonary:     Effort: Pulmonary effort is normal.     Breath sounds: Normal breath sounds. No wheezing or rales.  Abdominal:     General: Bowel sounds are normal. There is no distension.     Palpations: Abdomen is soft.     Tenderness: There is no abdominal tenderness.  Musculoskeletal:         General: Tenderness present.     Cervical back: Normal range of motion and neck supple.     Lumbar back: No swelling, edema, deformity, spasms, tenderness or bony tenderness. Decreased range of motion.     Comments: Neg SLR noted bilaterlaly  Normal gait   Lymphadenopathy:     Cervical: No cervical adenopathy.  Skin:    General: Skin is warm and dry.     Coloration: Skin is not pale.     Findings: No erythema or rash.  Neurological:     Mental Status: She is alert.     Cranial Nerves: No cranial nerve deficit.     Sensory: No sensory deficit.     Motor: No weakness, atrophy or abnormal muscle tone.  Coordination: Coordination normal.     Gait: Gait normal.     Deep Tendon Reflexes: Reflexes are normal and symmetric. Reflexes normal.     Comments: Negative SLR   Some change in sensation to light touch and temp on L lateral thigh   No motor changes    Psychiatric:        Mood and Affect: Mood normal.           Assessment & Plan:   Problem List Items Addressed This Visit       Other   Paresthesia - Primary    Pt has focal paresthesia (light numbness) over lateral thigh  Differential includes lumbar radiculopathy but I think more likely meralgia paresthetica given recent weight gain and distribution of the numbness (not below the knee)  Less likely MS or other neuro dx  Also no change in strength or muscle function   Discussed condition and reviewed some dermatome pictures today I reviewed most recent PT notes re: lumbar issues  Reassuring exam Pt is tolerating symptoms  Handout given Recommend weight loss when she is ready to work on it  Also avoid tight clothing  Analgesics if needed   Instructed to update if this worsens or fails to improve with conservative measures

## 2023-06-03 NOTE — Assessment & Plan Note (Signed)
Pt has focal paresthesia (light numbness) over lateral thigh  Differential includes lumbar radiculopathy but I think more likely meralgia paresthetica given recent weight gain and distribution of the numbness (not below the knee)  Less likely MS or other neuro dx  Also no change in strength or muscle function   Discussed condition and reviewed some dermatome pictures today I reviewed most recent PT notes re: lumbar issues  Reassuring exam Pt is tolerating symptoms  Handout given Recommend weight loss when she is ready to work on it  Also avoid tight clothing  Analgesics if needed   Instructed to update if this worsens or fails to improve with conservative measures

## 2023-06-07 ENCOUNTER — Ambulatory Visit (INDEPENDENT_AMBULATORY_CARE_PROVIDER_SITE_OTHER): Payer: Medicare HMO | Admitting: Psychology

## 2023-06-07 DIAGNOSIS — F331 Major depressive disorder, recurrent, moderate: Secondary | ICD-10-CM

## 2023-06-07 NOTE — Progress Notes (Signed)
[facility] Counselor/Therapist Progress Note  Patient ID: ZIYANA SASS, MRN: [id number],    Date: 06/07/2023  Time Spent: 10:00am-10:50am  50 minutes   Treatment Type: Individual Therapy  Reported Symptoms: stress  Mental Status Exam: Appearance:  Casual     Behavior: Appropriate  Motor: Normal  Speech/Language:  Normal Rate  Affect: Appropriate  Mood: normal  Thought process: normal  Thought content:   WNL  Sensory/Perceptual disturbances:   WNL  Orientation: oriented to person, place, time/date, and situation  Attention: Good  Concentration: Good  Memory: WNL  Fund of knowledge:  Good  Insight:   Good  Judgment:  Good  Impulse Control: Good   Risk Assessment: Danger to Self:  No Self-injurious Behavior: No Danger to Others: No Duty to Warn:no Physical Aggression / Violence:No  Access to Firearms a concern: No  Gang Involvement:No   Subjective: Pt present for face-to-face individual therapy via video.  Pt consents to telehealth video session and is aware of limitations of video sessions. Location of pt: home Location of therapist: home office.   Pt talked about feeling tired and stressed.   She had to have home repairs yesterday that were frustrating.   Pt feels overwhelmed with all she has to do.  Pt feels like she needs to cry but can't cry.  Worked on [employer], dispensing.    Pt talked about the recent challenges with [name].   [name] has had 3 falls in the past couple of days.   Addressed pt's concerns and care giving strategies.    Addressed the stress and how difficult it can be to manage [name] as he declines.   Pt talked about her health.  She is working on getting her shoulder surgery scheduled.   Worked on self care strategies.  Provided supportive therapy.  Interventions: Cognitive Behavioral Therapy and Insight-Oriented  Diagnosis:  F33.1  Plan of Care: Recommend ongoing therapy.   Pt participated in setting treatment goals.   Pt needs a  place to talk about stressors.  Plan to meet every two weeks.    Treatment Plan (target date: 03/23/2024) Client Abilities/Strengths  Pt is bright, engaging, and motivated for therapy.   Client Treatment Preferences  Individual therapy.  Client Statement of Needs  Improve coping skills.  Have a place to talk about stressors.   Symptoms  Depressed or irritable mood. Unresolved grief issues.  Problems Addressed  Unipolar Depression Goals 1. Alleviate depressive symptoms and return to previous level of effective functioning. 2. Appropriately grieve the loss in order to normalize mood and to return to previously adaptive level of functioning. Objective Learn and implement behavioral strategies to overcome depression. Target Date: 2024-03-23 Frequency: Biweekly  Progress: 40 Modality: individual  Related Interventions Engage the client in "behavioral activation," increasing his/her activity level and contact with sources of reward, while identifying processes that inhibit activation.  Use behavioral techniques such as instruction, rehearsal, role-playing, role reversal, as needed, to facilitate activity in the client's daily life; reinforce success. Assist the client in developing skills that increase the likelihood of deriving pleasure from behavioral activation (e.g., assertiveness skills, developing an exercise plan, less internal/more external focus, increased social involvement); reinforce success. Objective Identify important people in life, past and present, and describe the quality, good and poor, of those relationships. Target Date: 2024-03-23 Frequency: Biweekly  Progress: 40 Modality: individual  Related Interventions Conduct Interpersonal Therapy beginning with the assessment of the client's "interpersonal inventory" of important past and present relationships; develop a case  formulation linking depression to grief, interpersonal role disputes, role transitions, and/or  interpersonal deficits). Objective Learn and implement problem-solving and decision-making skills. Target Date: 2024-03-23 Frequency: Biweekly  Progress: 40 Modality: individual  Related Interventions Conduct Problem-Solving Therapy using techniques such as psychoeducation, modeling, and role-playing to teach client problem-solving skills (i.e., defining a problem specifically, generating possible solutions, evaluating the pros and cons of each solution, selecting and implementing a plan of action, evaluating the efficacy of the plan, accepting or revising the plan); role-play application of the problem-solving skill to a real life issue. Encourage in the client the development of a positive problem orientation in which problems and solving them are viewed as a natural part of life and not something to be feared, despaired, or avoided. 3. Develop healthy interpersonal relationships that lead to the alleviation and help prevent the relapse of depression. 4. Develop healthy thinking patterns and beliefs about self, others, and the world that lead to the alleviation and help prevent the relapse of depression. 5. Recognize, accept, and cope with feelings of depression. Diagnosis F33.1  Conditions For Discharge Achievement of treatment goals and objectives   Salomon Fick, LCSW

## 2023-06-08 ENCOUNTER — Encounter: Payer: Self-pay | Admitting: Family Medicine

## 2023-06-08 ENCOUNTER — Other Ambulatory Visit: Payer: Self-pay | Admitting: Family Medicine

## 2023-06-08 DIAGNOSIS — I4891 Unspecified atrial fibrillation: Secondary | ICD-10-CM

## 2023-06-08 DIAGNOSIS — F418 Other specified anxiety disorders: Secondary | ICD-10-CM

## 2023-06-09 DIAGNOSIS — I480 Paroxysmal atrial fibrillation: Secondary | ICD-10-CM | POA: Insufficient documentation

## 2023-06-09 DIAGNOSIS — I4891 Unspecified atrial fibrillation: Secondary | ICD-10-CM | POA: Insufficient documentation

## 2023-06-09 MED ORDER — VENLAFAXINE HCL ER 150 MG PO CP24: ORAL_CAPSULE | ORAL | 2 refills | Status: DC

## 2023-06-09 MED ORDER — VENLAFAXINE HCL ER 37.5 MG PO CP24: 37.5000 mg | ORAL_CAPSULE | Freq: Every day | ORAL | 2 refills | Status: DC

## 2023-06-09 NOTE — Telephone Encounter (Signed)
Eliquis is on med list as a historical entry, last OV was on 06/03/23

## 2023-06-09 NOTE — Telephone Encounter (Signed)
I pended the eliquis  ? Came from cardiology prior  Is she still on asa also? -please ask her

## 2023-06-10 NOTE — Telephone Encounter (Signed)
Called pt and no answer and no VM (kept ringing) son sent a mychart message with Dr. Royden Purl comments/Questions

## 2023-06-13 ENCOUNTER — Ambulatory Visit (INDEPENDENT_AMBULATORY_CARE_PROVIDER_SITE_OTHER): Payer: Medicare HMO | Admitting: Psychology

## 2023-06-13 DIAGNOSIS — F331 Major depressive disorder, recurrent, moderate: Secondary | ICD-10-CM

## 2023-06-13 NOTE — Telephone Encounter (Signed)
Will route the eliquis request to Dr. [name] per pt request

## 2023-06-13 NOTE — Progress Notes (Signed)
[facility] Counselor/Therapist Progress Note  Patient ID: Rachel Torres, MRN: [id number],    Date: 06/13/2023  Time Spent: 11:00am-11:50am  50 minutes   Treatment Type: Individual Therapy  Reported Symptoms: stress  Mental Status Exam: Appearance:  Casual     Behavior: Appropriate  Motor: Normal  Speech/Language:  Normal Rate  Affect: Appropriate  Mood: normal  Thought process: normal  Thought content:   WNL  Sensory/Perceptual disturbances:   WNL  Orientation: oriented to person, place, time/date, and situation  Attention: Good  Concentration: Good  Memory: WNL  Fund of knowledge:  Good  Insight:   Good  Judgment:  Good  Impulse Control: Good   Risk Assessment: Danger to Self:  No Self-injurious Behavior: No Danger to Others: No Duty to Warn:no Physical Aggression / Violence:No  Access to Firearms a concern: No  Gang Involvement:No   Subjective: Pt present for face-to-face individual therapy via video.  Pt consents to telehealth video session and is aware of limitations of video sessions. Location of pt: home Location of therapist: home office.   Pt talked about the recent challenges with [name].   [name] was asking pt if she loves him and worries about her leaving him.  This made pt sad. She has reassured him that she is committed to him and loves him.   Addressed pt's concerns and care giving strategies.   Addressed how pt can use writing things down for [name] as an adaptive tool to address his short term memory loss.  Addressed the stress and how difficult it can be to manage [name] as he declines.   Pt talked about her health.  She continues to have shoulder pain and is scheduling shoulder surgery for the end of the summer.  Pt talked about issues with extended family.  Family tends to rely on pt to solve problems.  Worked on healthy boundary setting.   Worked on self care strategies.  Provided supportive therapy.  Interventions: Cognitive Behavioral  Therapy and Insight-Oriented  Diagnosis:  F33.1  Plan of Care: Recommend ongoing therapy.   Pt participated in setting treatment goals.   Pt needs a place to talk about stressors.  Plan to meet every two weeks.    Treatment Plan (target date: 03/23/2024) Client Abilities/Strengths  Pt is bright, engaging, and motivated for therapy.   Client Treatment Preferences  Individual therapy.  Client Statement of Needs  Improve coping skills.  Have a place to talk about stressors.   Symptoms  Depressed or irritable mood. Unresolved grief issues.  Problems Addressed  Unipolar Depression Goals 1. Alleviate depressive symptoms and return to previous level of effective functioning. 2. Appropriately grieve the loss in order to normalize mood and to return to previously adaptive level of functioning. Objective Learn and implement behavioral strategies to overcome depression. Target Date: 2024-03-23 Frequency: Biweekly  Progress: 40 Modality: individual  Related Interventions Engage the client in "behavioral activation," increasing his/her activity level and contact with sources of reward, while identifying processes that inhibit activation.  Use behavioral techniques such as instruction, rehearsal, role-playing, role reversal, as needed, to facilitate activity in the client's daily life; reinforce success. Assist the client in developing skills that increase the likelihood of deriving pleasure from behavioral activation (e.g., assertiveness skills, developing an exercise plan, less internal/more external focus, increased social involvement); reinforce success. Objective Identify important people in life, past and present, and describe the quality, good and poor, of those relationships. Target Date: 2024-03-23 Frequency: Biweekly  Progress: 40 Modality:  individual  Related Interventions Conduct Interpersonal Therapy beginning with the assessment of the client's "interpersonal inventory" of important  past and present relationships; develop a case formulation linking depression to grief, interpersonal role disputes, role transitions, and/or interpersonal deficits). Objective Learn and implement problem-solving and decision-making skills. Target Date: 2024-03-23 Frequency: Biweekly  Progress: 40 Modality: individual  Related Interventions Conduct Problem-Solving Therapy using techniques such as psychoeducation, modeling, and role-playing to teach client problem-solving skills (i.e., defining a problem specifically, generating possible solutions, evaluating the pros and cons of each solution, selecting and implementing a plan of action, evaluating the efficacy of the plan, accepting or revising the plan); role-play application of the problem-solving skill to a real life issue. Encourage in the client the development of a positive problem orientation in which problems and solving them are viewed as a natural part of life and not something to be feared, despaired, or avoided. 3. Develop healthy interpersonal relationships that lead to the alleviation and help prevent the relapse of depression. 4. Develop healthy thinking patterns and beliefs about self, others, and the world that lead to the alleviation and help prevent the relapse of depression. 5. Recognize, accept, and cope with feelings of depression. Diagnosis F33.1  Conditions For Discharge Achievement of treatment goals and objectives   Salomon Fick, LCSW

## 2023-06-16 ENCOUNTER — Ambulatory Visit (INDEPENDENT_AMBULATORY_CARE_PROVIDER_SITE_OTHER): Payer: Medicare HMO | Admitting: Family Medicine

## 2023-06-16 VITALS — BP 116/70 | HR 81 | Temp 98.3°F | Ht 65.0 in | Wt 216.0 lb

## 2023-06-16 DIAGNOSIS — Z6835 Body mass index (BMI) 35.0-35.9, adult: Secondary | ICD-10-CM | POA: Diagnosis not present

## 2023-06-16 DIAGNOSIS — F3289 Other specified depressive episodes: Secondary | ICD-10-CM | POA: Diagnosis not present

## 2023-06-16 DIAGNOSIS — E669 Obesity, unspecified: Secondary | ICD-10-CM | POA: Diagnosis not present

## 2023-06-16 MED ORDER — TOPIRAMATE 50 MG PO TABS: 50.0000 mg | ORAL_TABLET | Freq: Every evening | ORAL | 0 refills | Status: DC

## 2023-06-16 NOTE — Progress Notes (Signed)
Cardiology Office Note  Date:  06/17/2023   ID:  Rachel Torres, DOB [birth year], MRN [id number]  PCP:  [name], MD   Chief Complaint  Patient presents with   3 month follow up     Patient c/o shortness of breath with little to no exertion. Medications reviewed by the patient verbally.     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  rushed to [facility] and underwent aortic valve replacement,  bypass of the right coronary artery with venous donor site from her right thigh,  aortic grafting for a extensive dissection of the  ascending aorta,  residual descending aortic dissection extending into the iliac artery,  She reports pathology of her aortic valve showed bicuspid valve EF 30 to 35,  up to 45 to 50% in August 2020, unchanged October 2023 chronic back pain, shortness of breath,  abdominal pain,  Chest pain episodes resolved by GI cocktail Essential hypertension OSA not on CPAP She presents today for follow-up of her aortic valve replacement and CABG, afib 3/24  LOV 9/23 Weight higher, seeing clinic No regular exercise program, stress eating did not tolerate Ozempic, reports she has chronic GI issues On topamax  Eats out at restaurants Paste May need shoulder surgery  seen in the ER at [facility] on March 09, 2023 for palpitation was found to be in atrial fibrillation. She was put on Eliquis. She had a TEE guided cardioversion on March 17, 2023. She is symptomatic while in atrial fibrillation with fatigue.  Afib in the setting of abscess  TEE at [facility] 03/17/23 Cardioversion eliquis  Denies further tachypalpitations concerning for atrial fibrillation Carvedilol increased up to 12.5 twice daily Tolerating Eliquis 5 twice daily  Has chest pain when she lays down at nighttime, thinks it is from her excess weight  EKG personally reviewed by myself on todays visit EKG  Interpretation Date/Time:  Friday June 17 2023 10:18:24 EDT Ventricular Rate:  75 PR Interval:  198 QRS Duration:  100 QT Interval:  428 QTC Calculation: 477 R Axis:   -14  Text Interpretation: Sinus rhythm with occasional and consecutive Premature ventricular complexes Moderate voltage criteria for LVH, may be normal variant ( R in aVL , Cornell product ) Nonspecific ST and T wave abnormality When compared with ECG of 21-Jan-2018 21:49, Premature ventricular complexes are now Present Premature atrial complexes are no longer Present Confirmed by Julien Nordmann [phone]) on 06/17/2023 10:43:07 AM    Receiving support from the [place] Husband with extreme medical issues Having renovations done on their house to accommodate his debility  Reports her watch sometimes reads low oxygen level diagnosed with obstructive sleep apnea. She has a CPAP machine but she is not using it.   Labs reviewed A1C 5.9 Total chol 149, LDL 68   Prior workup for chest discomfort Stress test 10/23 Pharmacological myocardial perfusion imaging study with no significant  ischemia Normal wall motion, EF estimated at 45% (significant GI uptake artifact noted) No EKG changes concerning for ischemia at peak stress or in recovery. Low risk scan   Echocardiogram with EF 45 to 50%, stable valve  Last CT scan 8/22, followed by vascular  EKG personally reviewed by myself on todays visit Normal sinus rhythm rate 74 bpm no significant ST-T wave changes  Other past medical history reviewed covid 11/2019 Got it from husband ,he spent time in rehab  Previous  30 day monitor  ordered for palpitations,  APCs and PVCs  Previous evaluations in the emergency room for chest pain. Cardiac enzymes were negative , EKG unchanged .She had a CT scan dated 06/20/2013. She was transferred to [facility] given her complicated anatomy. images were evaluated by vascular surgery it was determined there have been no significant progression of her  disease   Previous evaluation at [facility] for shortness of breath and chest discomfort. She had workup for cardiac issues including echocardiogram and PET stress, CTA of the chest showing stable descending aorta repair with type B. aortic dissection with opacification of the false lumen at the level of the renal arteries via a lumbar artery.   Echocardiogram showed ejection fraction had improved to 45-50%, mild MR, TR (ejection fraction improved from 30-35% in January 2012)   Previous H/o abdominal pain, further workup showing biliary stones and sludge. s/p  laparoscopic cholecystectomy at [facility].    PMH:   has a past medical history of AAA (abdominal aortic aneurysm) (HCC), Alcohol abuse, Allergy, Anemia, Anxiety, Asthma, CAD (coronary artery disease), Chewing difficulty, Chronic combined systolic (congestive) and diastolic (congestive) heart failure (HCC), Constipation, Depression, Edema of both lower extremities, Gallbladder sludge, GERD (gastroesophageal reflux disease), Heart failure (HCC), Heart valve problem, History of Bicuspid Aortic Valve, repair of dissecting thoracic aortic aneurysm, Stanford type A, Hyperlipidemia, Hypertension, Hypertensive heart disease, IBS (irritable bowel syndrome), Internal hemorrhoids, Kidney problem, Marfan syndrome, Migraine, Obstructive sleep apnea, Osteoarthritis, Osteoporosis, Palpitations, Pneumonia, and RA (rheumatoid arthritis) (HCC).  PSH:    Past Surgical History:  Procedure Laterality Date   ABDOMINAL AORTIC ANEURYSM REPAIR     CARDIAC CATHETERIZATION  2001   CESAREAN SECTION  1986 & 1991   CHOLECYSTECTOMY     COLONOSCOPY     ESOPHAGOGASTRODUODENOSCOPY (EGD) WITH PROPOFOL N/A 05/08/2020   Procedure: ESOPHAGOGASTRODUODENOSCOPY (EGD) WITH PROPOFOL;  Surgeon: Pasty Spillers, MD;  Location: [facility] ENDOSCOPY;  Service: Endoscopy;  Laterality: N/A;   FRACTURE SURGERY Right    shoulder replacement   HEMORRHOID BANDING     ORIF DISTAL RADIUS FRACTURE Left     UPPER GASTROINTESTINAL ENDOSCOPY      Current Outpatient Medications  Medication Sig Dispense Refill   acetaminophen (TYLENOL) 500 MG tablet Take 500 mg by mouth every 6 (six) hours as needed.     albuterol (VENTOLIN HFA) 108 (90 Base) MCG/ACT inhaler Inhale 1-2 puffs into the lungs every 6 (six) hours as needed for wheezing or shortness of breath. 3 each 3   ALPRAZolam (XANAX) 0.5 MG tablet Take 1 tablet (0.5 mg total) by mouth daily as needed for anxiety. 30 tablet 0   aspirin EC 81 MG tablet Take 81 mg by mouth at bedtime.     atorvastatin (LIPITOR) 20 MG tablet Take 20 mg by mouth. Four days per week     carvedilol (COREG) 12.5 MG tablet Take 1 tablet (12.5 mg total) by mouth 2 (two) times daily. 180 tablet 3   cetirizine (ZYRTEC) 10 MG tablet Take 1 tablet (10 mg total) by mouth daily as needed for allergies. 90 tablet 3   Cholecalciferol (VITAMIN D3) 50 MCG (2000 UT) CAPS Take 2,000 capsules by mouth. FOUR TIMES A WEEK     cyclobenzaprine (FLEXERIL) 10 MG tablet Take 0.5-1 tablets (5-10 mg total) by mouth 3 (three) times daily as needed. For headache 90 tablet 3   ELIQUIS 5 MG TABS tablet Take 5 mg by mouth 2 (two) times daily.     ezetimibe (ZETIA) 10 MG tablet Take 1 tablet (  10 mg total) by mouth daily. 90 tablet 3   fluticasone (FLONASE) 50 MCG/ACT nasal spray Place 2 sprays into both nostrils daily as needed for allergies or rhinitis. 48 g 3   furosemide (LASIX) 20 MG tablet Take 40 mg by mouth daily.     hydrOXYzine (ATARAX) 25 MG tablet Take 25 mg by mouth 3 (three) times daily.     isosorbide mononitrate (IMDUR) 30 MG 24 hr tablet Take 0.5 tablets (15 mg total) by mouth daily. 45 tablet 3   lisinopril (ZESTRIL) 5 MG tablet Take 2.5 mg by mouth daily.     magnesium gluconate (MAGONATE) 500 MG tablet Take 500 mg by mouth at bedtime.     Melatonin 5 MG SUBL Take 10 mg by mouth at bedtime as needed.     nitroGLYCERIN (NITROSTAT) 0.4 MG SL tablet Place 1 tablet (0.4 mg total) under  the tongue every 5 (five) minutes as needed for chest pain. 25 tablet 3   omeprazole (PRILOSEC) 40 MG capsule TAKE 1 CAPSULE EVERY DAY 90 capsule 2   polyethylene glycol powder (GLYCOLAX/MIRALAX) 17 GM/SCOOP powder Take 17 g by mouth as needed. 255 g 0   potassium chloride (KLOR-CON M) 10 MEQ tablet Take 10 mEq by mouth 2 (two) times daily.     promethazine (PHENERGAN) 25 MG tablet Take 0.5 tablets (12.5 mg total) by mouth every 8 (eight) hours as needed for nausea. 90 tablet 3   TART CHERRY PO Take 1 capsule by mouth 2 (two) times daily.     topiramate (TOPAMAX) 50 MG tablet Take 1 tablet (50 mg total) by mouth at bedtime. 30 tablet 0   venlafaxine XR (EFFEXOR-XR) 150 MG 24 hr capsule TAKE 1 CAPSULE EVERY DAY WITH BREAKFAST 90 capsule 2   venlafaxine XR (EFFEXOR-XR) 37.5 MG 24 hr capsule Take 1 capsule (37.5 mg total) by mouth daily with breakfast. 90 capsule 2   No current facility-administered medications for this visit.    Allergies:   Fosamax [alendronate sodium], Ginzing [ginseng], Hydrocod poli-chlorphe poli er, Hydrocodone-acetaminophen, Imitrex [sumatriptan], Oxycodone, Peanut-containing drug products, Tramadol, Hydrocodone-guaifenesin, Prednisone, and Tape   Social History:  The patient  reports that she quit smoking about 44 years ago. Her smoking use included cigarettes. She has a 1.13 pack-year smoking history. She has never used smokeless tobacco. She reports current alcohol use. She reports that she does not use drugs.   Family History:   family history includes Aneurysm in her paternal grandmother; Anxiety disorder in her mother; Arthritis in her paternal grandfather, paternal grandmother, sister, and sister; Asthma in her brother, mother, sister, and sister; Colon cancer in her maternal grandmother; Depression in her mother; Diabetes in her brother, paternal grandfather, paternal grandmother, sister, and sister; Heart disease in her mother and sister; Hypertension in her mother;  Irritable bowel syndrome in her mother; Lung cancer in her maternal grandfather; Lymphoma in her maternal grandmother; Melanoma in her maternal grandfather; Migraines in her brother, maternal grandmother, sister, and sister; Nephrolithiasis in her sister; Osteoporosis in her mother and sister; Other in her brother; Other (age of onset: 31) in her father; Stroke in her maternal grandmother and mother; Thyroid disease in her mother.    Review of Systems: Review of Systems  Constitutional: Negative.   HENT: Negative.    Respiratory:  Positive for shortness of breath.   Cardiovascular:  Positive for chest pain.  Gastrointestinal: Negative.   Musculoskeletal: Negative.   Neurological: Negative.   Psychiatric/Behavioral: Negative.    All other  systems reviewed and are negative.   PHYSICAL EXAM: VS:  BP 108/70 (BP Location: Left Arm, Patient Position: Sitting, Cuff Size: Normal)   Pulse 75   Ht 5\' 5"  (1.651 m)   Wt 220 lb 8 oz (100 kg)   SpO2 97%   BMI 36.69 kg/m  , BMI Body mass index is 36.69 kg/m. Constitutional:  oriented to person, place, and time. No distress.  HENT:  Head: Grossly normal Eyes:  no discharge. No scleral icterus.  Neck: No JVD, no carotid bruits  Cardiovascular: Regular rate and rhythm, no murmurs appreciated Pulmonary/Chest: Clear to auscultation bilaterally, no wheezes or rails Abdominal: Soft.  no distension.  no tenderness.  Musculoskeletal: Normal range of motion Neurological:  normal muscle tone. Coordination normal. No atrophy Skin: Skin warm and dry Psychiatric: normal affect, pleasant  Recent Labs: 10/26/2022: BNP 16.0; Hemoglobin 13.6; Platelets 333; TSH 1.280 02/17/2023: ALT 18; BUN 14; Creatinine, Ser 0.85; Potassium 4.7; Sodium 140    Lipid Panel Lab Results  Component Value Date   CHOL 149 02/17/2023   HDL 64 02/17/2023   LDLCALC 68 02/17/2023   TRIG 95 02/17/2023     Wt Readings from Last 3 Encounters:  06/17/23 220 lb 8 oz (100 kg)   06/16/23 216 lb (98 kg)  06/03/23 218 lb 8 oz (99.1 kg)     ASSESSMENT AND PLAN:  Pure hypercholesterolemia Cholesterol is at goal on the current lipid regimen. No changes to the medications were made.  Essential hypertension -  Blood pressure is well controlled on today's visit. No changes made to the medications.  CORONARY ARTERY BYPASS GRAFT Chronic stable angina Stable symptoms Stress test October 2023 no ischemia  Dissection of thoracoabdominal aorta (HCC) Followed by vascular surgery in [name], Dr. Anner Crete on  CT 07/2021,  Given chest pain laying down at nighttime, shortness of breath, we will order repeat CT scan, has been 2 years  H/O dissecting abdominal aortic aneurysm repair - MRI 2020  Reports she has not followed by vascular Repeat CT scan as above  Chronic diastolic CHF euvolemic echo EF 45% to 50%  appears euvolemic  Abdominal pain Symptoms improved  Morbid obesity Previously tried Ozempic but had side effects Working with weight loss clinic  Adjustment disorder Significant stress at home, husband and mother Receiving some financial support from the [place] which is helping   Total encounter time more than 40 minutes  Greater than 50% was spent in counseling and coordination of care with the patient    Orders Placed This Encounter  Procedures   EKG 12-Lead     Signed, Dossie Arbour, M.D., Ph.D. 06/17/2023  [facility] [place], [place] [phone]

## 2023-06-17 ENCOUNTER — Encounter: Payer: Self-pay | Admitting: Cardiovascular Disease

## 2023-06-17 ENCOUNTER — Ambulatory Visit: Payer: Medicare HMO | Attending: Cardiology | Admitting: Cardiovascular Disease

## 2023-06-17 ENCOUNTER — Ambulatory Visit: Payer: Medicare HMO | Admitting: Cardiology

## 2023-06-17 VITALS — BP 108/70 | HR 75 | Ht 65.0 in | Wt 220.5 lb

## 2023-06-17 DIAGNOSIS — I480 Paroxysmal atrial fibrillation: Secondary | ICD-10-CM | POA: Diagnosis not present

## 2023-06-17 DIAGNOSIS — Z951 Presence of aortocoronary bypass graft: Secondary | ICD-10-CM | POA: Diagnosis not present

## 2023-06-17 DIAGNOSIS — E7800 Pure hypercholesterolemia, unspecified: Secondary | ICD-10-CM | POA: Diagnosis not present

## 2023-06-17 DIAGNOSIS — I10 Essential (primary) hypertension: Secondary | ICD-10-CM

## 2023-06-17 DIAGNOSIS — I7101 Dissection of ascending aorta: Secondary | ICD-10-CM

## 2023-06-17 DIAGNOSIS — I25118 Atherosclerotic heart disease of native coronary artery with other forms of angina pectoris: Secondary | ICD-10-CM

## 2023-06-17 DIAGNOSIS — Z952 Presence of prosthetic heart valve: Secondary | ICD-10-CM | POA: Diagnosis not present

## 2023-06-17 DIAGNOSIS — I5032 Chronic diastolic (congestive) heart failure: Secondary | ICD-10-CM

## 2023-06-17 DIAGNOSIS — Z79899 Other long term (current) drug therapy: Secondary | ICD-10-CM

## 2023-06-17 MED ORDER — ELIQUIS 5 MG PO TABS: 5.0000 mg | ORAL_TABLET | Freq: Two times a day (BID) | ORAL | 3 refills | Status: DC

## 2023-06-17 NOTE — Patient Instructions (Addendum)
Medication Instructions:  No changes  [facility]  If you need a refill on your cardiac medications before your next appointment, please call your pharmacy.   Lab work: Your provider would like for you to return  with in 30 days of your CT scan to have the following labs drawn: BMET.   Please go to the [facility] entrance and check in at the front desk.  You do not need an appointment.  They are open from 7am-6 pm.    Testing/Procedures:   Your cardiac CT will be scheduled at one of the below locations:    [facility] [address] [place], [place] [address] [phone]  OR   [facility] [address] [place], [place] [address] [phone]  I If scheduled at [facility] or [facility], please arrive 15 mins early for check-in and test prep.       We will call to schedule your test 2-4 weeks out understanding that some insurance companies will need an authorization prior to the service being performed.   For more information and frequently asked questions, please visit our website : [email or web address]  For non-scheduling related questions, please contact the cardiac imaging nurse navigator should you have any questions/concerns: Rockwell Alexandria, Cardiac Imaging Nurse Navigator Larey Brick, Cardiac Imaging Nurse Navigator [facility] Direct Office Dial: [phone]   For scheduling needs, including cancellations and rescheduling, please call [place], [phone].     Follow-Up: At [facility], you and your health needs are our priority.  As part of our continuing mission to provide you with exceptional heart care, we have created designated Provider Care Teams.  These Care Teams include your primary Cardiologist (physician) and Advanced Practice Providers (APPs -  Physician  Assistants and Nurse Practitioners) who all work together to provide you with the care you need, when you need it.  You will need a follow up appointment in 12 months  Providers on your designated Care Team:   Nicolasa Ducking, NP Eula Listen, PA-C Cadence Fransico Michael, [place]  COVID-19 Vaccine Information can be found at: [email or web address] For questions related to vaccine distribution or appointments, please email [email or web address] or call [phone].

## 2023-06-20 DIAGNOSIS — M75100 Unspecified rotator cuff tear or rupture of unspecified shoulder, not specified as traumatic: Secondary | ICD-10-CM | POA: Diagnosis not present

## 2023-06-20 NOTE — Progress Notes (Signed)
Chief Complaint:   OBESITY Rachel Torres is here to discuss her progress with her obesity treatment plan along with follow-up of her obesity related diagnoses. Terah is on following a lower carbohydrate, vegetable and lean protein rich diet plan and states she is following her eating plan approximately 0% of the time. Kaesha states she is doing 0 minutes 0 times per week.  Today's visit was #: 33 Starting weight: 213 lbs Starting date: 05/05/2020 Today's weight: 216 lbs Today's date: 06/16/2023 Total lbs lost to date: 0 Total lbs lost since last in-office visit: 0  Interim History: Patient's last visit was approximately 4 months ago.  She has had a lot of stressors and has not been able to concentrate on weight loss.  She is working on getting back on track.  Subjective:   1. Emotional Eating Behavior Patient has had a lot of stressors and has been off track, but she is ready to start again.  She is not sleeping well but she is seeing her therapist.  Assessment/Plan:   1. Emotional Eating Behavior Patient agreed to restart Topamax 50 mg nightly with no refills, and she will continue Effexor.  - topiramate (TOPAMAX) 50 MG tablet; Take 1 tablet (50 mg total) by mouth at bedtime.  Dispense: 30 tablet; Refill: 0  2. BMI 35.0-35.9,adult  3. Obesity, Beginning BMI 35.45 [name] is currently in the action stage of change. As such, her goal is to get back to weightloss efforts . She has agreed to following a lower carbohydrate, vegetable and lean protein rich diet plan.   Behavioral modification strategies: emotional eating strategies.  [name] has agreed to follow-up with our clinic in 4 weeks. She was informed of the importance of frequent follow-up visits to maximize her success with intensive lifestyle modifications for her multiple health conditions.   Objective:   Blood pressure 116/70, pulse 81, temperature 98.3 F (36.8 C), height 5\' 5"  (1.651 m), weight 216 lb (98 kg), SpO2  95 %. Body mass index is 35.94 kg/m.  Lab Results  Component Value Date   CREATININE 0.85 02/17/2023   BUN 14 02/17/2023   NA 140 02/17/2023   K 4.7 02/17/2023   CL 101 02/17/2023   CO2 23 02/17/2023   Lab Results  Component Value Date   ALT 18 02/17/2023   AST 19 02/17/2023   ALKPHOS 78 02/17/2023   BILITOT 0.4 02/17/2023   Lab Results  Component Value Date   HGBA1C 5.9 (H) 02/17/2023   HGBA1C 5.8 (H) 10/26/2022   HGBA1C 5.5 03/17/2022   HGBA1C 5.5 12/03/2021   HGBA1C 5.6 07/10/2021   Lab Results  Component Value Date   INSULIN 22.4 02/17/2023   INSULIN 11.7 10/26/2022   INSULIN 4.5 12/03/2021   INSULIN 17.3 02/26/2021   INSULIN 13.9 10/29/2020   Lab Results  Component Value Date   TSH 1.280 10/26/2022   Lab Results  Component Value Date   CHOL 149 02/17/2023   HDL 64 02/17/2023   LDLCALC 68 02/17/2023   LDLDIRECT 189.4 10/13/2011   TRIG 95 02/17/2023   CHOLHDL 3 03/17/2022   Lab Results  Component Value Date   VD25OH 36.4 02/17/2023   VD25OH 57.6 10/26/2022   VD25OH 57.7 12/03/2021   Lab Results  Component Value Date   WBC 5.1 10/26/2022   HGB 13.6 10/26/2022   HCT 42.0 10/26/2022   MCV 98 (H) 10/26/2022   PLT 333 10/26/2022   Lab Results  Component Value Date   FERRITIN  105.0 12/11/2010   Attestation Statements:   Reviewed by clinician on day of visit: allergies, medications, problem list, medical history, surgical history, family history, social history, and previous encounter notes.  Time spent on visit including pre-visit chart review and post-visit care and charting was 30 minutes.   I, Burt Knack, am acting as transcriptionist for [name], MD.  I have reviewed the above documentation for accuracy and completeness, and I agree with the above. -  [name], MD

## 2023-06-21 ENCOUNTER — Ambulatory Visit (INDEPENDENT_AMBULATORY_CARE_PROVIDER_SITE_OTHER): Payer: Medicare HMO | Admitting: Psychology

## 2023-06-21 DIAGNOSIS — F331 Major depressive disorder, recurrent, moderate: Secondary | ICD-10-CM | POA: Diagnosis not present

## 2023-06-21 NOTE — Progress Notes (Signed)
[facility] Counselor/Therapist Progress Note  Patient ID: JOVEE EARNEY, MRN: [id number],    Date: 06/21/2023  Time Spent: 10:00am-10:50am  50 minutes   Treatment Type: Individual Therapy  Reported Symptoms: stress  Mental Status Exam: Appearance:  Casual     Behavior: Appropriate  Motor: Normal  Speech/Language:  Normal Rate  Affect: Appropriate  Mood: normal  Thought process: normal  Thought content:   WNL  Sensory/Perceptual disturbances:   WNL  Orientation: oriented to person, place, time/date, and situation  Attention: Good  Concentration: Good  Memory: WNL  Fund of knowledge:  Good  Insight:   Good  Judgment:  Good  Impulse Control: Good   Risk Assessment: Danger to Self:  No Self-injurious Behavior: No Danger to Others: No Duty to Warn:no Physical Aggression / Violence:No  Access to Firearms a concern: No  Gang Involvement:No   Subjective: Pt present for face-to-face individual therapy via video.  Pt consents to telehealth video session and is aware of limitations of video sessions. Location of pt: home Location of therapist: home office.   Pt talked about the recent challenges with [name].   [name] has been falling bc he doesn't always use his walker.  Addressed pt's concerns and care giving strategies.   Addressed the stress and how difficult it can be to manage [name] as he declines.   Pt talked about her health.  She saw the surgeon yesterday about her shoulder.  He does not want to do surgery.  He recommends physical therapy instead.   Pt will start PT soon.  Pt is working on getting back on track with her weight loss plan.   Worked on self care strategies.  Provided supportive therapy.  Interventions: Cognitive Behavioral Therapy and Insight-Oriented  Diagnosis:  F33.1  Plan of Care: Recommend ongoing therapy.   Pt participated in setting treatment goals.   Pt needs a place to talk about stressors.  Plan to meet every two weeks.     Treatment Plan (target date: 03/23/2024) Client Abilities/Strengths  Pt is bright, engaging, and motivated for therapy.   Client Treatment Preferences  Individual therapy.  Client Statement of Needs  Improve coping skills.  Have a place to talk about stressors.   Symptoms  Depressed or irritable mood. Unresolved grief issues.  Problems Addressed  Unipolar Depression Goals 1. Alleviate depressive symptoms and return to previous level of effective functioning. 2. Appropriately grieve the loss in order to normalize mood and to return to previously adaptive level of functioning. Objective Learn and implement behavioral strategies to overcome depression. Target Date: 2024-03-23 Frequency: Biweekly  Progress: 40 Modality: individual  Related Interventions Engage the client in "behavioral activation," increasing his/her activity level and contact with sources of reward, while identifying processes that inhibit activation.  Use behavioral techniques such as instruction, rehearsal, role-playing, role reversal, as needed, to facilitate activity in the client's daily life; reinforce success. Assist the client in developing skills that increase the likelihood of deriving pleasure from behavioral activation (e.g., assertiveness skills, developing an exercise plan, less internal/more external focus, increased social involvement); reinforce success. Objective Identify important people in life, past and present, and describe the quality, good and poor, of those relationships. Target Date: 2024-03-23 Frequency: Biweekly  Progress: 40 Modality: individual  Related Interventions Conduct Interpersonal Therapy beginning with the assessment of the client's "interpersonal inventory" of important past and present relationships; develop a case formulation linking depression to grief, interpersonal role disputes, role transitions, and/or interpersonal deficits). Objective Learn and implement  problem-solving  and decision-making skills. Target Date: 2024-03-23 Frequency: Biweekly  Progress: 40 Modality: individual  Related Interventions Conduct Problem-Solving Therapy using techniques such as psychoeducation, modeling, and role-playing to teach client problem-solving skills (i.e., defining a problem specifically, generating possible solutions, evaluating the pros and cons of each solution, selecting and implementing a plan of action, evaluating the efficacy of the plan, accepting or revising the plan); role-play application of the problem-solving skill to a real life issue. Encourage in the client the development of a positive problem orientation in which problems and solving them are viewed as a natural part of life and not something to be feared, despaired, or avoided. 3. Develop healthy interpersonal relationships that lead to the alleviation and help prevent the relapse of depression. 4. Develop healthy thinking patterns and beliefs about self, others, and the world that lead to the alleviation and help prevent the relapse of depression. 5. Recognize, accept, and cope with feelings of depression. Diagnosis F33.1  Conditions For Discharge Achievement of treatment goals and objectives   Salomon Fick, LCSW

## 2023-06-24 ENCOUNTER — Ambulatory Visit
Admission: RE | Admit: 2023-06-24 | Discharge: 2023-06-24 | Disposition: A | Discharge status: Home / Self Care | Payer: Medicare HMO | Source: Ambulatory Visit | Attending: Cardiovascular Disease | Admitting: Cardiovascular Disease

## 2023-06-24 ENCOUNTER — Other Ambulatory Visit: Payer: Self-pay

## 2023-06-24 ENCOUNTER — Emergency Department: Payer: Medicare HMO

## 2023-06-24 ENCOUNTER — Emergency Department
Admission: EM | Admit: 2023-06-24 | Discharge: 2023-06-24 | Disposition: A | Discharge status: Home / Self Care | Payer: Medicare HMO | Attending: Emergency Medicine | Admitting: Emergency Medicine

## 2023-06-24 DIAGNOSIS — S92312A Displaced fracture of first metatarsal bone, left foot, initial encounter for closed fracture: Secondary | ICD-10-CM | POA: Diagnosis not present

## 2023-06-24 DIAGNOSIS — S92325A Nondisplaced fracture of second metatarsal bone, left foot, initial encounter for closed fracture: Secondary | ICD-10-CM | POA: Diagnosis not present

## 2023-06-24 DIAGNOSIS — Y92009 Unspecified place in unspecified non-institutional (private) residence as the place of occurrence of the external cause: Secondary | ICD-10-CM | POA: Diagnosis not present

## 2023-06-24 DIAGNOSIS — I2510 Atherosclerotic heart disease of native coronary artery without angina pectoris: Secondary | ICD-10-CM | POA: Diagnosis not present

## 2023-06-24 DIAGNOSIS — Z952 Presence of prosthetic heart valve: Secondary | ICD-10-CM | POA: Insufficient documentation

## 2023-06-24 DIAGNOSIS — M419 Scoliosis, unspecified: Secondary | ICD-10-CM | POA: Diagnosis not present

## 2023-06-24 DIAGNOSIS — W19XXXA Unspecified fall, initial encounter: Secondary | ICD-10-CM | POA: Diagnosis not present

## 2023-06-24 DIAGNOSIS — K769 Liver disease, unspecified: Secondary | ICD-10-CM | POA: Diagnosis not present

## 2023-06-24 DIAGNOSIS — S99922A Unspecified injury of left foot, initial encounter: Secondary | ICD-10-CM | POA: Diagnosis present

## 2023-06-24 DIAGNOSIS — S92302A Fracture of unspecified metatarsal bone(s), left foot, initial encounter for closed fracture: Secondary | ICD-10-CM

## 2023-06-24 DIAGNOSIS — I10 Essential (primary) hypertension: Secondary | ICD-10-CM | POA: Insufficient documentation

## 2023-06-24 DIAGNOSIS — S199XXA Unspecified injury of neck, initial encounter: Secondary | ICD-10-CM | POA: Diagnosis not present

## 2023-06-24 DIAGNOSIS — M79631 Pain in right forearm: Secondary | ICD-10-CM | POA: Diagnosis not present

## 2023-06-24 DIAGNOSIS — S0033XA Contusion of nose, initial encounter: Secondary | ICD-10-CM | POA: Insufficient documentation

## 2023-06-24 DIAGNOSIS — S0083XA Contusion of other part of head, initial encounter: Secondary | ICD-10-CM | POA: Insufficient documentation

## 2023-06-24 DIAGNOSIS — I7101 Dissection of ascending aorta: Secondary | ICD-10-CM

## 2023-06-24 DIAGNOSIS — S92315A Nondisplaced fracture of first metatarsal bone, left foot, initial encounter for closed fracture: Secondary | ICD-10-CM | POA: Insufficient documentation

## 2023-06-24 DIAGNOSIS — W01198A Fall on same level from slipping, tripping and stumbling with subsequent striking against other object, initial encounter: Secondary | ICD-10-CM | POA: Insufficient documentation

## 2023-06-24 DIAGNOSIS — M25511 Pain in right shoulder: Secondary | ICD-10-CM | POA: Diagnosis not present

## 2023-06-24 DIAGNOSIS — S92335A Nondisplaced fracture of third metatarsal bone, left foot, initial encounter for closed fracture: Secondary | ICD-10-CM | POA: Diagnosis not present

## 2023-06-24 DIAGNOSIS — M25519 Pain in unspecified shoulder: Secondary | ICD-10-CM | POA: Diagnosis not present

## 2023-06-24 DIAGNOSIS — M5030 Other cervical disc degeneration, unspecified cervical region: Secondary | ICD-10-CM | POA: Diagnosis not present

## 2023-06-24 DIAGNOSIS — S40011A Contusion of right shoulder, initial encounter: Secondary | ICD-10-CM | POA: Insufficient documentation

## 2023-06-24 DIAGNOSIS — M542 Cervicalgia: Secondary | ICD-10-CM | POA: Insufficient documentation

## 2023-06-24 DIAGNOSIS — S0990XA Unspecified injury of head, initial encounter: Secondary | ICD-10-CM

## 2023-06-24 DIAGNOSIS — S5001XA Contusion of right elbow, initial encounter: Secondary | ICD-10-CM | POA: Insufficient documentation

## 2023-06-24 DIAGNOSIS — R58 Hemorrhage, not elsewhere classified: Secondary | ICD-10-CM | POA: Diagnosis not present

## 2023-06-24 DIAGNOSIS — S92345A Nondisplaced fracture of fourth metatarsal bone, left foot, initial encounter for closed fracture: Secondary | ICD-10-CM | POA: Insufficient documentation

## 2023-06-24 DIAGNOSIS — R932 Abnormal findings on diagnostic imaging of liver and biliary tract: Secondary | ICD-10-CM | POA: Diagnosis not present

## 2023-06-24 DIAGNOSIS — M47812 Spondylosis without myelopathy or radiculopathy, cervical region: Secondary | ICD-10-CM | POA: Diagnosis not present

## 2023-06-24 DIAGNOSIS — M25521 Pain in right elbow: Secondary | ICD-10-CM | POA: Diagnosis not present

## 2023-06-24 DIAGNOSIS — J342 Deviated nasal septum: Secondary | ICD-10-CM | POA: Diagnosis not present

## 2023-06-24 DIAGNOSIS — I6782 Cerebral ischemia: Secondary | ICD-10-CM | POA: Diagnosis not present

## 2023-06-24 DIAGNOSIS — Q438 Other specified congenital malformations of intestine: Secondary | ICD-10-CM | POA: Diagnosis not present

## 2023-06-24 LAB — POCT I-STAT CREATININE: Creatinine, Ser: 1 mg/dL (ref 0.44–1.00)

## 2023-06-24 LAB — CBG MONITORING, ED: Glucose-Capillary: 100 mg/dL — ABNORMAL HIGH (ref 70–99)

## 2023-06-24 MED ORDER — IOHEXOL 350 MG/ML SOLN
100.0000 mL | Freq: Once | INTRAVENOUS | Status: AC | PRN
  Administered 2023-06-24: 100 mL via INTRAVENOUS

## 2023-06-24 MED ORDER — DIPHENHYDRAMINE HCL 25 MG PO CAPS
25.0000 mg | ORAL_CAPSULE | Freq: Once | ORAL | Status: AC
  Administered 2023-06-24: 25 mg via ORAL
  Filled 2023-06-24: qty 1

## 2023-06-24 MED ORDER — OXYCODONE-ACETAMINOPHEN 5-325 MG PO TABS: 1.0000 | ORAL_TABLET | ORAL | 0 refills | Status: DC | PRN

## 2023-06-24 MED ORDER — OXYCODONE-ACETAMINOPHEN 5-325 MG PO TABS
1.0000 | ORAL_TABLET | Freq: Once | ORAL | Status: AC
  Administered 2023-06-24: 1 via ORAL
  Filled 2023-06-24: qty 1

## 2023-06-24 MED ORDER — FENTANYL CITRATE PF 50 MCG/ML IJ SOSY
50.0000 ug | PREFILLED_SYRINGE | Freq: Once | INTRAMUSCULAR | Status: AC
  Administered 2023-06-24: 50 ug via INTRAMUSCULAR
  Filled 2023-06-24: qty 1

## 2023-06-24 NOTE — Discharge Instructions (Signed)
Follow-up with your regular doctor as needed.  Follow-up with orthopedics. You need to be rechecked due to the fractures in your foot.  Wear the cam boot when up and walking.  Elevate and ice.  Take the pain medication as prescribed.

## 2023-06-24 NOTE — ED Triage Notes (Signed)
Pt comes via EMs from home with c/o trip and fall. Pt states right shoulder pain, no loc. Pt states she hit her head and heard a crunch in her nose.   VSS

## 2023-06-24 NOTE — ED Notes (Signed)
Ice packs applied to right shoulder and arm.

## 2023-06-24 NOTE — ED Notes (Signed)
Pt given walker and instructed on use, pt given sling for right arm per request

## 2023-06-24 NOTE — ED Provider Notes (Signed)
[facility] Provider Note    Event Date/Time   First MD Initiated Contact with Patient 06/24/23 1953     (approximate)   History   Fall   HPI  Rachel Torres is a 67 y.o. female with extensive past medical history including osteoporosis, RA, CAD, hypertension among other chronic problems presents emergency department after a fall.  Patient was at home when she tripped and fell landing on her face.  Complaining of facial pain, neck pain, right shoulder pain, right arm pain, left foot pain.  States she has a cut on her lip but did not break her teeth.  Did hear a snap and crunch when she landed on her face.  Did have little bit of a nosebleed.  No LOC.      Physical Exam   Triage Vital Signs: ED Triage Vitals  Enc Vitals Group     BP 06/24/23 1900 (!) 151/73     Pulse Rate 06/24/23 1900 88     Resp 06/24/23 1900 18     Temp 06/24/23 1900 98.1 F (36.7 C)     Temp src --      SpO2 06/24/23 1900 91 %     Weight --      Height --      Head Circumference --      Peak Flow --      Pain Score 06/24/23 1857 6     Pain Loc --      Pain Edu? --      Excl. in GC? --     Most recent vital signs: Vitals:   06/24/23 2015 06/24/23 2140  BP: (!) 144/64   Pulse: 85 88  Resp: 18   Temp:    SpO2: 94% 95%     General: Awake, no distress.   CV:  Good peripheral perfusion. regular rate and  rhythm Resp:  Normal effort. Lungs cta Abd:  No distention.   Other:  Facial contusion noted around the bridge of the nose, abrasion noted at the nose, abrasion noted at the upper lip at the center, no gaping laceration noted, right shoulder is tender to palpation, right elbow and forearm are tender to Palpation, left foot is tender to palpation, C-spine and facial structure is tender to palpation, cranial nerves II through XII grossly intact, neurovascular intact   ED Results / Procedures / Treatments   Labs (all labs ordered are listed, but only abnormal  results are displayed) Labs Reviewed  CBG MONITORING, ED - Abnormal; Notable for the following components:      Result Value   Glucose-Capillary 100 (*)    All other components within normal limits     EKG     RADIOLOGY CT of the head, maxillofacial, C-spine X-ray of the right shoulder, right forearm, right foot, right elbow    PROCEDURES:   Procedures   MEDICATIONS ORDERED IN ED: Medications  oxyCODONE-acetaminophen (PERCOCET/ROXICET) 5-325 MG per tablet 1 tablet (has no administration in time range)  fentaNYL (SUBLIMAZE) injection 50 mcg (50 mcg Intramuscular Given 06/24/23 2015)  oxyCODONE-acetaminophen (PERCOCET/ROXICET) 5-325 MG per tablet 1 tablet (1 tablet Oral Given 06/24/23 2100)  diphenhydrAMINE (BENADRYL) capsule 25 mg (25 mg Oral Given 06/24/23 2100)     IMPRESSION / MDM / ASSESSMENT AND PLAN / ED COURSE  I reviewed the triage vital signs and the nursing notes.  Differential diagnosis includes, but is not limited to, subdural, SAH, fracture, contusion, strain  Patient's presentation is most consistent with acute presentation with potential threat to life or bodily function.   CT of the head, maxillofacial and C-spine are independently reviewed and interpreted by me as being negative for any acute abnormality  X-ray of the right shoulder, independently reviewed interpreted by me as being negative for acute abnormality  X-ray of the left foot shows a metatarsal fracture from metatarsal 1 through 4, nondisplaced, independently reviewed and interpreted by me  X-ray of the right forearm, radiologist report states possible fracture of the radial head would like a dedicated elbow series, ordered x-ray of the right elbow, independent reviewed interpreted by me and the radiologist as being negative for any acute abnormality  Did explain all these findings to the patient.  Due to her being concerned about her glucose we did a CBG which was  100 which is normal.  To give her reassurance about her glucose.  She is to follow-up with her orthopedist at [facility] or either with [facility] clinic.  She was placed in a cam walker.  Given a walker for support.  Percocet for pain.  She is to apply ice return emergency department if worsening.  She is in agreement treatment plan.  Discharged stable condition.      FINAL CLINICAL IMPRESSION(S) / ED DIAGNOSES   Final diagnoses:  Fall, initial encounter  Closed nondisplaced fracture of metatarsal bone of left foot, unspecified metatarsal, initial encounter  Injury of head, initial encounter  Contusion of face, initial encounter  Contusion of right shoulder, initial encounter  Contusion of right elbow, initial encounter     Rx / DC Orders   ED Discharge Orders          Ordered    oxyCODONE-acetaminophen (PERCOCET) 5-325 MG tablet  Every 4 hours PRN        06/24/23 2139             Note:  This document was prepared using Dragon voice recognition software and may include unintentional dictation errors.    Faythe Ghee, PA-C 06/24/23 2151    Dionne Bucy, MD 06/25/23 Perlie Mayo

## 2023-06-25 DIAGNOSIS — M069 Rheumatoid arthritis, unspecified: Secondary | ICD-10-CM | POA: Diagnosis not present

## 2023-06-25 DIAGNOSIS — S82892D Other fracture of left lower leg, subsequent encounter for closed fracture with routine healing: Secondary | ICD-10-CM | POA: Diagnosis not present

## 2023-06-25 DIAGNOSIS — S92902D Unspecified fracture of left foot, subsequent encounter for fracture with routine healing: Secondary | ICD-10-CM | POA: Diagnosis not present

## 2023-06-25 DIAGNOSIS — I4891 Unspecified atrial fibrillation: Secondary | ICD-10-CM | POA: Diagnosis not present

## 2023-06-25 DIAGNOSIS — S40011D Contusion of right shoulder, subsequent encounter: Secondary | ICD-10-CM | POA: Diagnosis not present

## 2023-06-25 DIAGNOSIS — E785 Hyperlipidemia, unspecified: Secondary | ICD-10-CM | POA: Diagnosis not present

## 2023-06-25 DIAGNOSIS — I2510 Atherosclerotic heart disease of native coronary artery without angina pectoris: Secondary | ICD-10-CM | POA: Diagnosis not present

## 2023-06-25 DIAGNOSIS — I10 Essential (primary) hypertension: Secondary | ICD-10-CM | POA: Diagnosis not present

## 2023-06-25 DIAGNOSIS — Z7901 Long term (current) use of anticoagulants: Secondary | ICD-10-CM | POA: Diagnosis not present

## 2023-06-25 DIAGNOSIS — Z950 Presence of cardiac pacemaker: Secondary | ICD-10-CM | POA: Diagnosis not present

## 2023-06-27 ENCOUNTER — Ambulatory Visit: Payer: Medicare HMO

## 2023-06-28 ENCOUNTER — Ambulatory Visit: Payer: Self-pay

## 2023-06-28 ENCOUNTER — Telehealth: Payer: Self-pay | Admitting: *Deleted

## 2023-06-28 ENCOUNTER — Telehealth: Payer: Self-pay | Admitting: Family Medicine

## 2023-06-28 ENCOUNTER — Ambulatory Visit (INDEPENDENT_AMBULATORY_CARE_PROVIDER_SITE_OTHER): Payer: Medicare HMO | Admitting: Psychology

## 2023-06-28 DIAGNOSIS — S46012D Strain of muscle(s) and tendon(s) of the rotator cuff of left shoulder, subsequent encounter: Secondary | ICD-10-CM | POA: Diagnosis not present

## 2023-06-28 DIAGNOSIS — F331 Major depressive disorder, recurrent, moderate: Secondary | ICD-10-CM

## 2023-06-28 DIAGNOSIS — I4891 Unspecified atrial fibrillation: Secondary | ICD-10-CM | POA: Diagnosis not present

## 2023-06-28 DIAGNOSIS — I110 Hypertensive heart disease with heart failure: Secondary | ICD-10-CM | POA: Diagnosis not present

## 2023-06-28 DIAGNOSIS — I429 Cardiomyopathy, unspecified: Secondary | ICD-10-CM | POA: Diagnosis not present

## 2023-06-28 DIAGNOSIS — I25119 Atherosclerotic heart disease of native coronary artery with unspecified angina pectoris: Secondary | ICD-10-CM | POA: Diagnosis not present

## 2023-06-28 DIAGNOSIS — F329 Major depressive disorder, single episode, unspecified: Secondary | ICD-10-CM | POA: Diagnosis not present

## 2023-06-28 DIAGNOSIS — M80072D Age-related osteoporosis with current pathological fracture, left ankle and foot, subsequent encounter for fracture with routine healing: Secondary | ICD-10-CM | POA: Diagnosis not present

## 2023-06-28 DIAGNOSIS — F410 Panic disorder [episodic paroxysmal anxiety] without agoraphobia: Secondary | ICD-10-CM | POA: Diagnosis not present

## 2023-06-28 DIAGNOSIS — I5042 Chronic combined systolic (congestive) and diastolic (congestive) heart failure: Secondary | ICD-10-CM | POA: Diagnosis not present

## 2023-06-28 NOTE — Chronic Care Management (AMB) (Signed)
   06/28/2023  Rachel Torres 1956/10/15 [id number]   Reason for Encounter: Patient is not currently enrolled in the CCM program. CCM enrollment status changed to "Previously enrolled"   [facility]/Chronic Care Management [phone]

## 2023-06-28 NOTE — Telephone Encounter (Signed)
I am currently out of town How do I order a caregiver ? Is that part of a HH order? (I don't remember seeing that as part of a HH order (usually nursing or therapy) Can they take a verbal order?

## 2023-06-28 NOTE — Progress Notes (Signed)
[facility] Counselor/Therapist Progress Note  Patient ID: Rachel Torres, MRN: [id number],    Date: 06/28/2023  Time Spent: 9:00am-9:50am  50 minutes   Treatment Type: Individual Therapy  Reported Symptoms: stress  Mental Status Exam: Appearance:  Casual     Behavior: Appropriate  Motor: Normal  Speech/Language:  Normal Rate  Affect: Appropriate  Mood: normal  Thought process: normal  Thought content:   WNL  Sensory/Perceptual disturbances:   WNL  Orientation: oriented to person, place, time/date, and situation  Attention: Good  Concentration: Good  Memory: WNL  Fund of knowledge:  Good  Insight:   Good  Judgment:  Good  Impulse Control: Good   Risk Assessment: Danger to Self:  No Self-injurious Behavior: No Danger to Others: No Duty to Warn:no Physical Aggression / Violence:No  Access to Firearms a concern: No  Gang Involvement:No   Subjective: Pt present for face-to-face individual therapy via video.  Pt consents to telehealth video session and is aware of limitations of video sessions. Location of pt: home Location of therapist: home office.   Pt talked about falling last week and breaking her foot and hurting her shoulder and banging up her face.  Pt is in a lot of pain and can not take care of [name] so the [place] is going to arrange for him to go to a facility for a few weeks.  Pt is worried about how [name] will react to having to go to a facility.  Pt needs to arrange for someone to come into the home to help her with meals.  Pt is very stressed with this situation and being in so much pain.   Worked on Pharmacologist.    Worked on self care strategies.  Provided supportive therapy.  Interventions: Cognitive Behavioral Therapy and Insight-Oriented  Diagnosis:  F33.1  Plan of Care: Recommend ongoing therapy.   Pt participated in setting treatment goals.   Pt needs a place to talk about stressors.  Plan to meet every two weeks.    Treatment Plan  (target date: 03/23/2024) Client Abilities/Strengths  Pt is bright, engaging, and motivated for therapy.   Client Treatment Preferences  Individual therapy.  Client Statement of Needs  Improve coping skills.  Have a place to talk about stressors.   Symptoms  Depressed or irritable mood. Unresolved grief issues.  Problems Addressed  Unipolar Depression Goals 1. Alleviate depressive symptoms and return to previous level of effective functioning. 2. Appropriately grieve the loss in order to normalize mood and to return to previously adaptive level of functioning. Objective Learn and implement behavioral strategies to overcome depression. Target Date: 2024-03-23 Frequency: Biweekly  Progress: 40 Modality: individual  Related Interventions Engage the client in "behavioral activation," increasing his/her activity level and contact with sources of reward, while identifying processes that inhibit activation.  Use behavioral techniques such as instruction, rehearsal, role-playing, role reversal, as needed, to facilitate activity in the client's daily life; reinforce success. Assist the client in developing skills that increase the likelihood of deriving pleasure from behavioral activation (e.g., assertiveness skills, developing an exercise plan, less internal/more external focus, increased social involvement); reinforce success. Objective Identify important people in life, past and present, and describe the quality, good and poor, of those relationships. Target Date: 2024-03-23 Frequency: Biweekly  Progress: 40 Modality: individual  Related Interventions Conduct Interpersonal Therapy beginning with the assessment of the client's "interpersonal inventory" of important past and present relationships; develop a case formulation linking depression to grief, interpersonal role disputes,  role transitions, and/or interpersonal deficits). Objective Learn and implement problem-solving and decision-making  skills. Target Date: 2024-03-23 Frequency: Biweekly  Progress: 40 Modality: individual  Related Interventions Conduct Problem-Solving Therapy using techniques such as psychoeducation, modeling, and role-playing to teach client problem-solving skills (i.e., defining a problem specifically, generating possible solutions, evaluating the pros and cons of each solution, selecting and implementing a plan of action, evaluating the efficacy of the plan, accepting or revising the plan); role-play application of the problem-solving skill to a real life issue. Encourage in the client the development of a positive problem orientation in which problems and solving them are viewed as a natural part of life and not something to be feared, despaired, or avoided. 3. Develop healthy interpersonal relationships that lead to the alleviation and help prevent the relapse of depression. 4. Develop healthy thinking patterns and beliefs about self, others, and the world that lead to the alleviation and help prevent the relapse of depression. 5. Recognize, accept, and cope with feelings of depression. Diagnosis F33.1  Conditions For Discharge Achievement of treatment goals and objectives   Salomon Fick, LCSW

## 2023-06-28 NOTE — Telephone Encounter (Signed)
Scarlette Calico from [facility] called over and stated that patient was released from ED on July 6. She stated that she had contusion of the R shoulder and multiple left foot fractures. [facility] is doing her PT and OT, but they are needing an order for a caregiver to be sent over to them. Thank you!

## 2023-06-28 NOTE — Transitions of Care (Post Inpatient/ED Visit) (Signed)
06/28/2023  Name: Rachel Torres MRN: [id number] DOB: 1956/01/04  Today's TOC FU Call Status: Today's TOC FU Call Status:: Successful TOC FU Call Competed TOC FU Call Complete Date: 06/28/23  Transition Care Management Follow-up Telephone Call Date of Discharge: 06/25/23 Discharge Facility: Other ([facility]) Name of Other ([facility]) Discharge Facility: [facility] Type of Discharge: Emergency Department (contusion of shoulder and multiple foot fracture) How have you been since you were released from the hospital?: Same (not good I am having difficulty getting around and I need a caregiver.) Any questions or concerns?: Yes Patient Questions/Concerns:: I need a caregiver. I am the caregiver for my husband and I can't get around to take care of myself Patient Questions/Concerns Addressed: Notified Provider of Patient Questions/Concerns  Items Reviewed: Did you receive and understand the discharge instructions provided?: Yes Medications obtained,verified, and reconciled?: Yes (Medications Reviewed) Any new allergies since your discharge?: No Dietary orders reviewed?: No Do you have support at home?: Yes People in Home: spouse Name of Support/Comfort Primary Source: sons Forrest  Medications Reviewed Today: Medications Reviewed Today     Reviewed by Luella Cook, RN (Case Manager) on 06/28/23 at 1534  Med List Status: <None>   Medication Order Taking? Sig Documenting Provider Last Dose Status Informant  acetaminophen (TYLENOL) 500 MG tablet [id number] Yes Take 500 mg by mouth every 6 (six) hours as needed. [provider] Taking Active   albuterol (VENTOLIN HFA) 108 (90 Base) MCG/ACT inhaler [id number] Yes Inhale 1-2 puffs into the lungs every 6 (six) hours as needed for wheezing or shortness of breath. Tower, Audrie Gallus, MD Taking Active   ALPRAZolam Prudy Feeler) 0.5 MG tablet [id number] Yes Take 1 tablet (0.5 mg total) by mouth daily as needed for anxiety. Tower,  Audrie Gallus, MD Taking Active   aspirin EC 81 MG tablet [id number] Yes Take 81 mg by mouth at bedtime. [provider] Taking Active Self  atorvastatin (LIPITOR) 20 MG tablet [id number] Yes Take 20 mg by mouth. Four days per week [provider] Taking Active   carvedilol (COREG) 12.5 MG tablet [id number]  Take 1 tablet (12.5 mg total) by mouth 2 (two) times daily. Charlsie Quest, NP  Expired 06/17/23 2359   cetirizine (ZYRTEC) 10 MG tablet [id number] Yes Take 1 tablet (10 mg total) by mouth daily as needed for allergies. Tower, Audrie Gallus, MD Taking Active   Cholecalciferol (VITAMIN D3) 50 MCG (2000 UT) CAPS [id number] Yes Take 2,000 capsules by mouth. FOUR TIMES A WEEK [provider] Taking Active   cyclobenzaprine (FLEXERIL) 10 MG tablet [id number] Yes Take 0.5-1 tablets (5-10 mg total) by mouth 3 (three) times daily as needed. For headache Tower, Audrie Gallus, MD Taking Active   ELIQUIS 5 MG TABS tablet [id number] Yes Take 1 tablet (5 mg total) by mouth 2 (two) times daily. Antonieta Iba, MD Taking Active   ezetimibe (ZETIA) 10 MG tablet [id number] Yes Take 1 tablet (10 mg total) by mouth daily. Charlsie Quest, NP Taking Active   fluticasone (FLONASE) 50 MCG/ACT nasal spray [id number] Yes Place 2 sprays into both nostrils daily as needed for allergies or rhinitis. Tower, Audrie Gallus, MD Taking Active   furosemide (LASIX) 20 MG tablet [id number] Yes Take 40 mg by mouth daily. [provider] Taking Active   hydrOXYzine (ATARAX) 25 MG tablet [id number] Yes Take 25 mg by mouth 3 (three) times daily. [provider] Taking Active   isosorbide mononitrate (IMDUR) 30 MG 24 hr tablet [id number] Yes Take 0.5  tablets (15 mg total) by mouth daily. Charlsie Quest, NP Taking Active   lisinopril (ZESTRIL) 5 MG tablet [id number] Yes Take 2.5 mg by mouth daily. [provider] Taking Active   magnesium gluconate (MAGONATE) 500 MG tablet [id number] Yes Take 500 mg by mouth at bedtime.  [provider] Taking Active Self  Melatonin 5 MG SUBL [id number] Yes Take 10 mg by mouth at bedtime as needed. [provider] Taking Active   nitroGLYCERIN (NITROSTAT) 0.4 MG SL tablet [id number] Yes Place 1 tablet (0.4 mg total) under the tongue every 5 (five) minutes as needed for chest pain. Antonieta Iba, MD Taking Active   omeprazole (PRILOSEC) 40 MG capsule [id number] Yes TAKE 1 CAPSULE EVERY DAY Tower, Audrie Gallus, MD Taking Active   oxyCODONE-acetaminophen (PERCOCET) 5-325 MG tablet [id number] Yes Take 1 tablet by mouth every 4 (four) hours as needed for severe pain. Faythe Ghee, PA-C Taking Active   polyethylene glycol powder (GLYCOLAX/MIRALAX) 17 GM/SCOOP powder [id number] Yes Take 17 g by mouth as needed. [name] D, MD Taking Active   potassium chloride (KLOR-CON M) 10 MEQ tablet [id number] Yes Take 10 mEq by mouth 2 (two) times daily. [provider] Taking Active   promethazine (PHENERGAN) 25 MG tablet [id number] Yes Take 0.5 tablets (12.5 mg total) by mouth every 8 (eight) hours as needed for nausea. Tower, Audrie Gallus, MD Taking Active   TART CHERRY PO [id number] Yes Take 1 capsule by mouth 2 (two) times daily. [provider] Taking Active   topiramate (TOPAMAX) 50 MG tablet [id number] Yes Take 1 tablet (50 mg total) by mouth at bedtime. [name] D, MD Taking Active   venlafaxine XR (EFFEXOR-XR) 150 MG 24 hr capsule [id number] Yes TAKE 1 CAPSULE EVERY DAY WITH BREAKFAST Tower, Audrie Gallus, MD Taking Active   venlafaxine XR (EFFEXOR-XR) 37.5 MG 24 hr capsule [id number] Yes Take 1 capsule (37.5 mg total) by mouth daily with breakfast. Tower, Audrie Gallus, MD Taking Active             Home Care and Equipment/Supplies: Were Home Health Services Ordered?: Yes Name of Home Health Agency:: [facility] Has Agency set up a time to come to your home?: Yes First Home Health Visit Date: 06/28/23 Any new equipment or medical supplies ordered?: Yes Name of  Medical supply agency?: [facility] Were you able to get the equipment/medical supplies?: Yes Do you have any questions related to the use of the equipment/supplies?: No  Functional Questionnaire: Do you need assistance with bathing/showering or dressing?: Yes Do you need assistance with meal preparation?: Yes Do you need assistance with eating?: No Do you have difficulty maintaining continence: No Do you need assistance with getting out of bed/getting out of a chair/moving?: Yes Do you have difficulty managing or taking your medications?: No  Follow up appointments reviewed: PCP Follow-up appointment confirmed?: Yes Date of PCP follow-up appointment?: 07/08/23 Follow-up Provider: [facility] Follow-up appointment confirmed?: Yes Date of Specialist follow-up appointment?: 06/29/23 Follow-Up Specialty Provider:: [facility] Do you need transportation to your follow-up appointment?: No Do you understand care options if your condition(s) worsen?: Yes-patient verbalized understanding  SDOH Interventions Today    Flowsheet Row Most Recent Value  SDOH Interventions   Food Insecurity Interventions Intervention Not Indicated  Housing Interventions Intervention Not Indicated  Transportation Interventions Intervention Not Indicated      Interventions Today    Flowsheet Row Most Recent Value  General Interventions   General Interventions Discussed/Reviewed General Interventions  Discussed, General Interventions Reviewed, Communication with  [Dr Tower nurse for [facility] aide order to be sent in.]  Pharmacy Interventions   Pharmacy Dicussed/Reviewed Pharmacy Topics Discussed      TOC Interventions Today    Flowsheet Row Most Recent Value  TOC Interventions   TOC Interventions Discussed/Reviewed TOC Interventions Discussed, TOC Interventions Reviewed, Arranged PCP follow up less than 12 days/Care Guide scheduled       Gean Maidens BSN RN [facility]  Management [phone]

## 2023-06-29 ENCOUNTER — Telehealth: Payer: Self-pay | Admitting: Family Medicine

## 2023-06-29 DIAGNOSIS — I25119 Atherosclerotic heart disease of native coronary artery with unspecified angina pectoris: Secondary | ICD-10-CM | POA: Diagnosis not present

## 2023-06-29 DIAGNOSIS — I4891 Unspecified atrial fibrillation: Secondary | ICD-10-CM | POA: Diagnosis not present

## 2023-06-29 DIAGNOSIS — S43401A Unspecified sprain of right shoulder joint, initial encounter: Secondary | ICD-10-CM | POA: Diagnosis not present

## 2023-06-29 DIAGNOSIS — S46012D Strain of muscle(s) and tendon(s) of the rotator cuff of left shoulder, subsequent encounter: Secondary | ICD-10-CM | POA: Diagnosis not present

## 2023-06-29 DIAGNOSIS — I5042 Chronic combined systolic (congestive) and diastolic (congestive) heart failure: Secondary | ICD-10-CM | POA: Diagnosis not present

## 2023-06-29 DIAGNOSIS — M80072D Age-related osteoporosis with current pathological fracture, left ankle and foot, subsequent encounter for fracture with routine healing: Secondary | ICD-10-CM | POA: Diagnosis not present

## 2023-06-29 DIAGNOSIS — F329 Major depressive disorder, single episode, unspecified: Secondary | ICD-10-CM | POA: Diagnosis not present

## 2023-06-29 DIAGNOSIS — I429 Cardiomyopathy, unspecified: Secondary | ICD-10-CM | POA: Diagnosis not present

## 2023-06-29 DIAGNOSIS — S92352A Displaced fracture of fifth metatarsal bone, left foot, initial encounter for closed fracture: Secondary | ICD-10-CM | POA: Diagnosis not present

## 2023-06-29 DIAGNOSIS — F410 Panic disorder [episodic paroxysmal anxiety] without agoraphobia: Secondary | ICD-10-CM | POA: Diagnosis not present

## 2023-06-29 DIAGNOSIS — I110 Hypertensive heart disease with heart failure: Secondary | ICD-10-CM | POA: Diagnosis not present

## 2023-06-29 NOTE — Telephone Encounter (Signed)
Please ok those verbal orders  

## 2023-06-29 NOTE — Telephone Encounter (Signed)
I have started referral and filled out parts for aid. Just need to completed and send

## 2023-06-29 NOTE — Telephone Encounter (Signed)
Home Health verbal orders Caller Name:JOEY Agency Name: [facility] number: [phone]  Requesting OT/PT/Skilled nursing/Social Work/Speech: PT   Reason: LEFT FOOT FRACTURE,RIGHT UPPER EXTREMITIES INJURY AFTER FALL  Frequency:1WK3  Please forward to [facility] pool or providers CMA

## 2023-06-29 NOTE — Addendum Note (Signed)
Addended by: [name] on: 06/29/2023 03:15 PM   Modules accepted: Orders

## 2023-06-30 ENCOUNTER — Ambulatory Visit (INDEPENDENT_AMBULATORY_CARE_PROVIDER_SITE_OTHER): Payer: Medicare HMO | Admitting: Psychology

## 2023-06-30 DIAGNOSIS — S43409A Unspecified sprain of unspecified shoulder joint, initial encounter: Secondary | ICD-10-CM | POA: Insufficient documentation

## 2023-06-30 DIAGNOSIS — S92352A Displaced fracture of fifth metatarsal bone, left foot, initial encounter for closed fracture: Secondary | ICD-10-CM | POA: Insufficient documentation

## 2023-06-30 DIAGNOSIS — F331 Major depressive disorder, recurrent, moderate: Secondary | ICD-10-CM

## 2023-06-30 NOTE — Progress Notes (Signed)
[facility] Counselor/Therapist Progress Note  Patient ID: CLARIE CAMEY, MRN: [id number],    Date: 06/30/2023  Time Spent: 5:00pm-5:50pm   50 minutes   Treatment Type: Individual Therapy  Reported Symptoms: stress  Mental Status Exam: Appearance:  Casual     Behavior: Appropriate  Motor: Normal  Speech/Language:  Normal Rate  Affect: Appropriate  Mood: normal  Thought process: normal  Thought content:   WNL  Sensory/Perceptual disturbances:   WNL  Orientation: oriented to person, place, time/date, and situation  Attention: Good  Concentration: Good  Memory: WNL  Fund of knowledge:  Good  Insight:   Good  Judgment:  Good  Impulse Control: Good   Risk Assessment: Danger to Self:  No Self-injurious Behavior: No Danger to Others: No Duty to Warn:no Physical Aggression / Violence:No  Access to Firearms a concern: No  Gang Involvement:No   Subjective: Pt present for face-to-face individual therapy via video.  Pt consents to telehealth video session and is aware of limitations of video sessions. Location of pt: home Location of therapist: home office.   [facility] for [place].   [name] is very confused and needs 24/7 care.   Pt saw the orthopedic yesterday and was told she has to be off her foot for about 6 weeks.  Pt is considering going into a facility for a few weeks as well since she can't care for herself right now.   Addressed pt's stress level.   She is worried about how she can manage all the caregiving needs.   Worked on Pharmacologist.    Worked on self care strategies.  Provided supportive therapy.  Interventions: Cognitive Behavioral Therapy and Insight-Oriented  Diagnosis:  F33.1  Plan of Care: Recommend ongoing therapy.   Pt participated in setting treatment goals.   Pt needs a place to talk about stressors.  Plan to meet every two weeks.    Treatment Plan (target date:  03/23/2024) Client Abilities/Strengths  Pt is bright, engaging, and motivated for therapy.   Client Treatment Preferences  Individual therapy.  Client Statement of Needs  Improve coping skills.  Have a place to talk about stressors.   Symptoms  Depressed or irritable mood. Unresolved grief issues.  Problems Addressed  Unipolar Depression Goals 1. Alleviate depressive symptoms and return to previous level of effective functioning. 2. Appropriately grieve the loss in order to normalize mood and to return to previously adaptive level of functioning. Objective Learn and implement behavioral strategies to overcome depression. Target Date: 2024-03-23 Frequency: Biweekly  Progress: 40 Modality: individual  Related Interventions Engage the client in "behavioral activation," increasing his/her activity level and contact with sources of reward, while identifying processes that inhibit activation.  Use behavioral techniques such as instruction, rehearsal, role-playing, role reversal, as needed, to facilitate activity in the client's daily life; reinforce success. Assist the client in developing skills that increase the likelihood of deriving pleasure from behavioral activation (e.g., assertiveness skills, developing an exercise plan, less internal/more external focus, increased social involvement); reinforce success. Objective Identify important people in life, past and present, and describe the quality, good and poor, of those relationships. Target Date: 2024-03-23 Frequency: Biweekly  Progress: 40 Modality: individual  Related Interventions Conduct Interpersonal Therapy beginning with the assessment of the client's "interpersonal inventory" of important past and present relationships; develop a case formulation linking depression to grief, interpersonal role disputes, role transitions, and/or interpersonal deficits). Objective Learn and implement problem-solving  and decision-making skills. Target  Date: 2024-03-23 Frequency: Biweekly  Progress: 40 Modality: individual  Related Interventions Conduct Problem-Solving Therapy using techniques such as psychoeducation, modeling, and role-playing to teach client problem-solving skills (i.e., defining a problem specifically, generating possible solutions, evaluating the pros and cons of each solution, selecting and implementing a plan of action, evaluating the efficacy of the plan, accepting or revising the plan); role-play application of the problem-solving skill to a real life issue. Encourage in the client the development of a positive problem orientation in which problems and solving them are viewed as a natural part of life and not something to be feared, despaired, or avoided. 3. Develop healthy interpersonal relationships that lead to the alleviation and help prevent the relapse of depression. 4. Develop healthy thinking patterns and beliefs about self, others, and the world that lead to the alleviation and help prevent the relapse of depression. 5. Recognize, accept, and cope with feelings of depression. Diagnosis F33.1  Conditions For Discharge Achievement of treatment goals and objectives   Salomon Fick, LCSW

## 2023-06-30 NOTE — Telephone Encounter (Signed)
VO left on Joey's secured VM

## 2023-07-01 DIAGNOSIS — I429 Cardiomyopathy, unspecified: Secondary | ICD-10-CM | POA: Diagnosis not present

## 2023-07-01 DIAGNOSIS — M80072D Age-related osteoporosis with current pathological fracture, left ankle and foot, subsequent encounter for fracture with routine healing: Secondary | ICD-10-CM | POA: Diagnosis not present

## 2023-07-01 DIAGNOSIS — I25119 Atherosclerotic heart disease of native coronary artery with unspecified angina pectoris: Secondary | ICD-10-CM | POA: Diagnosis not present

## 2023-07-01 DIAGNOSIS — F410 Panic disorder [episodic paroxysmal anxiety] without agoraphobia: Secondary | ICD-10-CM | POA: Diagnosis not present

## 2023-07-01 DIAGNOSIS — I110 Hypertensive heart disease with heart failure: Secondary | ICD-10-CM | POA: Diagnosis not present

## 2023-07-01 DIAGNOSIS — S46012D Strain of muscle(s) and tendon(s) of the rotator cuff of left shoulder, subsequent encounter: Secondary | ICD-10-CM | POA: Diagnosis not present

## 2023-07-01 DIAGNOSIS — I5042 Chronic combined systolic (congestive) and diastolic (congestive) heart failure: Secondary | ICD-10-CM | POA: Diagnosis not present

## 2023-07-01 DIAGNOSIS — F329 Major depressive disorder, single episode, unspecified: Secondary | ICD-10-CM | POA: Diagnosis not present

## 2023-07-01 DIAGNOSIS — I4891 Unspecified atrial fibrillation: Secondary | ICD-10-CM | POA: Diagnosis not present

## 2023-07-05 DIAGNOSIS — I5042 Chronic combined systolic (congestive) and diastolic (congestive) heart failure: Secondary | ICD-10-CM | POA: Diagnosis not present

## 2023-07-05 DIAGNOSIS — I4891 Unspecified atrial fibrillation: Secondary | ICD-10-CM | POA: Diagnosis not present

## 2023-07-05 DIAGNOSIS — S46012D Strain of muscle(s) and tendon(s) of the rotator cuff of left shoulder, subsequent encounter: Secondary | ICD-10-CM | POA: Diagnosis not present

## 2023-07-05 DIAGNOSIS — I110 Hypertensive heart disease with heart failure: Secondary | ICD-10-CM | POA: Diagnosis not present

## 2023-07-05 DIAGNOSIS — I429 Cardiomyopathy, unspecified: Secondary | ICD-10-CM | POA: Diagnosis not present

## 2023-07-05 DIAGNOSIS — I25119 Atherosclerotic heart disease of native coronary artery with unspecified angina pectoris: Secondary | ICD-10-CM | POA: Diagnosis not present

## 2023-07-05 DIAGNOSIS — F329 Major depressive disorder, single episode, unspecified: Secondary | ICD-10-CM | POA: Diagnosis not present

## 2023-07-05 DIAGNOSIS — F410 Panic disorder [episodic paroxysmal anxiety] without agoraphobia: Secondary | ICD-10-CM | POA: Diagnosis not present

## 2023-07-05 DIAGNOSIS — M80072D Age-related osteoporosis with current pathological fracture, left ankle and foot, subsequent encounter for fracture with routine healing: Secondary | ICD-10-CM | POA: Diagnosis not present

## 2023-07-06 DIAGNOSIS — I429 Cardiomyopathy, unspecified: Secondary | ICD-10-CM | POA: Diagnosis not present

## 2023-07-06 DIAGNOSIS — I5042 Chronic combined systolic (congestive) and diastolic (congestive) heart failure: Secondary | ICD-10-CM | POA: Diagnosis not present

## 2023-07-06 DIAGNOSIS — M80072D Age-related osteoporosis with current pathological fracture, left ankle and foot, subsequent encounter for fracture with routine healing: Secondary | ICD-10-CM | POA: Diagnosis not present

## 2023-07-06 DIAGNOSIS — J4520 Mild intermittent asthma, uncomplicated: Secondary | ICD-10-CM

## 2023-07-06 DIAGNOSIS — G4733 Obstructive sleep apnea (adult) (pediatric): Secondary | ICD-10-CM

## 2023-07-06 DIAGNOSIS — Z87891 Personal history of nicotine dependence: Secondary | ICD-10-CM

## 2023-07-06 DIAGNOSIS — F410 Panic disorder [episodic paroxysmal anxiety] without agoraphobia: Secondary | ICD-10-CM | POA: Diagnosis not present

## 2023-07-06 DIAGNOSIS — K219 Gastro-esophageal reflux disease without esophagitis: Secondary | ICD-10-CM

## 2023-07-06 DIAGNOSIS — G43109 Migraine with aura, not intractable, without status migrainosus: Secondary | ICD-10-CM

## 2023-07-06 DIAGNOSIS — Z9181 History of falling: Secondary | ICD-10-CM

## 2023-07-06 DIAGNOSIS — E785 Hyperlipidemia, unspecified: Secondary | ICD-10-CM

## 2023-07-06 DIAGNOSIS — I25119 Atherosclerotic heart disease of native coronary artery with unspecified angina pectoris: Secondary | ICD-10-CM | POA: Diagnosis not present

## 2023-07-06 DIAGNOSIS — H905 Unspecified sensorineural hearing loss: Secondary | ICD-10-CM

## 2023-07-06 DIAGNOSIS — S46012D Strain of muscle(s) and tendon(s) of the rotator cuff of left shoulder, subsequent encounter: Secondary | ICD-10-CM | POA: Diagnosis not present

## 2023-07-06 DIAGNOSIS — M069 Rheumatoid arthritis, unspecified: Secondary | ICD-10-CM

## 2023-07-06 DIAGNOSIS — Z6836 Body mass index (BMI) 36.0-36.9, adult: Secondary | ICD-10-CM

## 2023-07-06 DIAGNOSIS — G4709 Other insomnia: Secondary | ICD-10-CM

## 2023-07-06 DIAGNOSIS — F329 Major depressive disorder, single episode, unspecified: Secondary | ICD-10-CM | POA: Diagnosis not present

## 2023-07-06 DIAGNOSIS — Z951 Presence of aortocoronary bypass graft: Secondary | ICD-10-CM

## 2023-07-06 DIAGNOSIS — I4891 Unspecified atrial fibrillation: Secondary | ICD-10-CM | POA: Diagnosis not present

## 2023-07-06 DIAGNOSIS — H04123 Dry eye syndrome of bilateral lacrimal glands: Secondary | ICD-10-CM

## 2023-07-06 DIAGNOSIS — M1990 Unspecified osteoarthritis, unspecified site: Secondary | ICD-10-CM

## 2023-07-06 DIAGNOSIS — E669 Obesity, unspecified: Secondary | ICD-10-CM

## 2023-07-06 DIAGNOSIS — Z7901 Long term (current) use of anticoagulants: Secondary | ICD-10-CM

## 2023-07-06 DIAGNOSIS — I110 Hypertensive heart disease with heart failure: Secondary | ICD-10-CM | POA: Diagnosis not present

## 2023-07-06 DIAGNOSIS — Z7982 Long term (current) use of aspirin: Secondary | ICD-10-CM

## 2023-07-07 ENCOUNTER — Encounter: Payer: Self-pay | Admitting: Cardiovascular Disease

## 2023-07-07 DIAGNOSIS — F410 Panic disorder [episodic paroxysmal anxiety] without agoraphobia: Secondary | ICD-10-CM | POA: Diagnosis not present

## 2023-07-07 DIAGNOSIS — I429 Cardiomyopathy, unspecified: Secondary | ICD-10-CM | POA: Diagnosis not present

## 2023-07-07 DIAGNOSIS — I5042 Chronic combined systolic (congestive) and diastolic (congestive) heart failure: Secondary | ICD-10-CM | POA: Diagnosis not present

## 2023-07-07 DIAGNOSIS — I110 Hypertensive heart disease with heart failure: Secondary | ICD-10-CM | POA: Diagnosis not present

## 2023-07-07 DIAGNOSIS — I4891 Unspecified atrial fibrillation: Secondary | ICD-10-CM | POA: Diagnosis not present

## 2023-07-07 DIAGNOSIS — M80072D Age-related osteoporosis with current pathological fracture, left ankle and foot, subsequent encounter for fracture with routine healing: Secondary | ICD-10-CM | POA: Diagnosis not present

## 2023-07-07 DIAGNOSIS — S46012D Strain of muscle(s) and tendon(s) of the rotator cuff of left shoulder, subsequent encounter: Secondary | ICD-10-CM | POA: Diagnosis not present

## 2023-07-07 DIAGNOSIS — I25119 Atherosclerotic heart disease of native coronary artery with unspecified angina pectoris: Secondary | ICD-10-CM | POA: Diagnosis not present

## 2023-07-07 DIAGNOSIS — F329 Major depressive disorder, single episode, unspecified: Secondary | ICD-10-CM | POA: Diagnosis not present

## 2023-07-07 MED ORDER — POTASSIUM CHLORIDE CRYS ER 10 MEQ PO TBCR: 10.0000 meq | EXTENDED_RELEASE_TABLET | Freq: Two times a day (BID) | ORAL | 3 refills | Status: DC

## 2023-07-08 ENCOUNTER — Inpatient Hospital Stay: Payer: Medicare HMO | Admitting: Family Medicine

## 2023-07-11 ENCOUNTER — Ambulatory Visit: Payer: Medicare HMO | Attending: Cardiovascular Disease

## 2023-07-11 ENCOUNTER — Ambulatory Visit (INDEPENDENT_AMBULATORY_CARE_PROVIDER_SITE_OTHER): Payer: Medicare HMO | Admitting: Psychology

## 2023-07-11 DIAGNOSIS — M25512 Pain in left shoulder: Secondary | ICD-10-CM | POA: Diagnosis present

## 2023-07-11 DIAGNOSIS — M25612 Stiffness of left shoulder, not elsewhere classified: Secondary | ICD-10-CM | POA: Diagnosis present

## 2023-07-11 DIAGNOSIS — F331 Major depressive disorder, recurrent, moderate: Secondary | ICD-10-CM

## 2023-07-11 DIAGNOSIS — R293 Abnormal posture: Secondary | ICD-10-CM | POA: Insufficient documentation

## 2023-07-11 NOTE — Progress Notes (Signed)
[facility] Counselor/Therapist Progress Note  Patient ID: Rachel Torres, MRN: [id number],    Date: 07/11/2023  Time Spent: 9:00am-9:50am   50 minutes   Treatment Type: Individual Therapy  Reported Symptoms: stress  Mental Status Exam: Appearance:  Casual     Behavior: Appropriate  Motor: Normal  Speech/Language:  Normal Rate  Affect: Appropriate  Mood: normal  Thought process: normal  Thought content:   WNL  Sensory/Perceptual disturbances:   WNL  Orientation: oriented to person, place, time/date, and situation  Attention: Good  Concentration: Good  Memory: WNL  Fund of knowledge:  Good  Insight:   Good  Judgment:  Good  Impulse Control: Good   Risk Assessment: Danger to Self:  No Self-injurious Behavior: No Danger to Others: No Duty to Warn:no Physical Aggression / Violence:No  Access to Firearms a concern: No  Gang Involvement:No   Subjective: Pt present for face-to-face individual therapy via video.  Pt consents to telehealth video session and is aware of limitations of video sessions. Location of pt: home Location of therapist: home office.   Pt talked about waiting for a placement for [name] for a 3 week respite.   [name] is sun downing and it has gotten much worse.   Pt was tearful as she talked about how difficult care giving has been.   Addressed pt's stress level.  Pt is worrying a lot.  Worked on Pharmacologist.    Pt talked about her health.  She is still healing from her fall.   She still has significant pain at times.   Worked on self care strategies.  Provided supportive therapy.  Interventions: Cognitive Behavioral Therapy and Insight-Oriented  Diagnosis:  F33.1  Plan of Care: Recommend ongoing therapy.   Pt participated in setting treatment goals.   Pt needs a place to talk about stressors.  Plan to meet every two weeks.    Treatment Plan (target date: 03/23/2024) Client Abilities/Strengths  Pt is bright, engaging, and motivated  for therapy.   Client Treatment Preferences  Individual therapy.  Client Statement of Needs  Improve coping skills.  Have a place to talk about stressors.   Symptoms  Depressed or irritable mood. Unresolved grief issues.  Problems Addressed  Unipolar Depression Goals 1. Alleviate depressive symptoms and return to previous level of effective functioning. 2. Appropriately grieve the loss in order to normalize mood and to return to previously adaptive level of functioning. Objective Learn and implement behavioral strategies to overcome depression. Target Date: 2024-03-23 Frequency: Biweekly  Progress: 40 Modality: individual  Related Interventions Engage the client in "behavioral activation," increasing his/her activity level and contact with sources of reward, while identifying processes that inhibit activation.  Use behavioral techniques such as instruction, rehearsal, role-playing, role reversal, as needed, to facilitate activity in the client's daily life; reinforce success. Assist the client in developing skills that increase the likelihood of deriving pleasure from behavioral activation (e.g., assertiveness skills, developing an exercise plan, less internal/more external focus, increased social involvement); reinforce success. Objective Identify important people in life, past and present, and describe the quality, good and poor, of those relationships. Target Date: 2024-03-23 Frequency: Biweekly  Progress: 40 Modality: individual  Related Interventions Conduct Interpersonal Therapy beginning with the assessment of the client's "interpersonal inventory" of important past and present relationships; develop a case formulation linking depression to grief, interpersonal role disputes, role transitions, and/or interpersonal deficits). Objective Learn and implement problem-solving and decision-making skills. Target Date: 2024-03-23 Frequency: Biweekly  Progress: 40 Modality:  individual   Related Interventions Conduct Problem-Solving Therapy using techniques such as psychoeducation, modeling, and role-playing to teach client problem-solving skills (i.e., defining a problem specifically, generating possible solutions, evaluating the pros and cons of each solution, selecting and implementing a plan of action, evaluating the efficacy of the plan, accepting or revising the plan); role-play application of the problem-solving skill to a real life issue. Encourage in the client the development of a positive problem orientation in which problems and solving them are viewed as a natural part of life and not something to be feared, despaired, or avoided. 3. Develop healthy interpersonal relationships that lead to the alleviation and help prevent the relapse of depression. 4. Develop healthy thinking patterns and beliefs about self, others, and the world that lead to the alleviation and help prevent the relapse of depression. 5. Recognize, accept, and cope with feelings of depression. Diagnosis F33.1  Conditions For Discharge Achievement of treatment goals and objectives   Salomon Fick, LCSW

## 2023-07-11 NOTE — Therapy (Signed)
OUTPATIENT PHYSICAL THERAPY SHOULDER EVALUATION   Patient Name: Rachel Torres MRN: [id number] DOB:05/04/1956, 67 y.o., female Today's Date: 07/12/2023  END OF SESSION:  PT End of Session - 07/11/23 1542     Visit Number 1    Number of Visits 8    Date for PT Re-Evaluation 09/05/23    Authorization Type 1/10 eval 7/22    PT Start Time 1459    PT Stop Time 1548    PT Time Calculation (min) 49 min    Activity Tolerance Patient limited by pain    Behavior During Therapy [facility] for tasks assessed/performed             Past Medical History:  Diagnosis Date   AAA (abdominal aortic aneurysm) (HCC)    a. Chronic w/o evidence of aneurysmal dil on CTA 01/2016.   Alcohol abuse    Allergy    Anemia    Anxiety    Asthma    CAD (coronary artery disease)    a. 08/2010 s/p CABG x 1 (VG->RCA) @ time of Ao dissection repair; b. 01/2016 Lexiscan MV: mid antsept/apical defect w/ ? peri-infarct ischemia-->likely attenuation-->Med Rx.   Chewing difficulty    Chronic combined systolic (congestive) and diastolic (congestive) heart failure (HCC)    a. 2012 EF 30-35%; b. 04/2015 EF 40-45%; c. 01/2016 Echo: Ef 55-65%, nl AoV bioprosthesis, nl RV, nl PASP.   Constipation    Depression    Edema of both lower extremities    Gallbladder sludge    GERD (gastroesophageal reflux disease)    Heart failure (HCC)    Heart valve problem    History of Bicuspid Aortic Valve    a. 08/2010 s/p AVR @ time of Ao dissection repair; b. 01/2016 Echo: Ef 55-65%, nl AoV bioprosthesis, nl RV, nl PASP.   Hx of repair of dissecting thoracic aortic aneurysm, Stanford type A    a. 08/2010 s/p repair with AVR and VG->RCA; b. 01/2016 CTA: stable appearance of Asc Thoracic Aortic graft. Opacification of flase lumen of chronic abd Ao dissection w/ retrograde flow through lumbar arteries. No aneurysmal dil of flase lumen.   Hyperlipidemia    Hypertension    Hypertensive heart disease    IBS (irritable bowel syndrome)     Internal hemorrhoids    Kidney problem    Marfan syndrome    pt denies   Migraine    Obstructive sleep apnea    Osteoarthritis    Osteoporosis    Palpitations    Pneumonia    RA (rheumatoid arthritis) (HCC)    a. Followed by Dr. Mallie Mussel   Past Surgical History:  Procedure Laterality Date   ABDOMINAL AORTIC ANEURYSM REPAIR     CARDIAC CATHETERIZATION  2001   CESAREAN SECTION  1986 & 1991   CHOLECYSTECTOMY     COLONOSCOPY     ESOPHAGOGASTRODUODENOSCOPY (EGD) WITH PROPOFOL N/A 05/08/2020   Procedure: ESOPHAGOGASTRODUODENOSCOPY (EGD) WITH PROPOFOL;  Surgeon: Pasty Spillers, MD;  Location: [facility] ENDOSCOPY;  Service: Endoscopy;  Laterality: N/A;   FRACTURE SURGERY Right    shoulder replacement   HEMORRHOID BANDING     ORIF DISTAL RADIUS FRACTURE Left    UPPER GASTROINTESTINAL ENDOSCOPY     Patient Active Problem List   Diagnosis Date Noted   Atrial fibrillation (HCC) 06/09/2023   Paresthesia 06/03/2023   BMI 34.0-34.9,adult 02/17/2023   Obesity, Beginning BMI 35.45 02/17/2023   Tinnitus 01/14/2023   Change in vision 01/14/2023   Colon cancer screening 01/14/2023  Personal history of fall 01/14/2023   Problem related to unspecified psychosocial circumstances 12/28/2022   Person encountering health services to consult on behalf of another person 12/28/2022   Palpitation 12/10/2022   Fatigue 10/26/2022   B12 deficiency 10/26/2022   Right shoulder pain 09/15/2022   Depression 09/14/2022   Class 2 severe obesity with serious comorbidity and body mass index (BMI) of 35.0 to 35.9 in adult [facility]) 09/14/2022   Other constipation 05/12/2022   Medicare annual wellness visit, initial 07/17/2021   TMJ (dislocation of temporomandibular joint) 07/17/2021   Encounter for screening mammogram for breast cancer 06/05/2021   Diarrhea 04/11/2020   Epigastric pain 02/19/2020   Dark stools 02/19/2020   History of atrial fibrillation 02/05/2019   Shortness of breath 01/27/2019    Irregular surface of cornea 12/25/2018   HPV (human papilloma virus) infection 12/01/2016   Screening mammogram, encounter for 11/29/2016   Estrogen deficiency 11/29/2016   Encounter for routine gynecological examination 11/29/2016   Grade III hemorrhoids 10/06/2016   Tachycardia 08/14/2016   Hemorrhoids 08/13/2016   Atypical migraine 08/03/2016   CAD (coronary artery disease)    AAA (abdominal aortic aneurysm) (HCC)    Chronic combined systolic (congestive) and diastolic (congestive) heart failure (HCC)    Prediabetes 02/11/2016   Generalized anxiety disorder 12/08/2015   Panic attacks 07/28/2015   Chest pain 07/09/2013   Sensorineural hearing loss 03/27/2013   Vitamin D deficiency 07/03/2012   H/O aortic dissection 05/12/2012   Routine general medical examination at a health care facility 09/20/2011   Atypical chest pain 09/15/2011   S/P AVR (aortic valve replacement) 01/07/2011   CORONARY ARTERY BYPASS GRAFT, HX OF 01/07/2011   Arthritis 12/20/2010   Back pain 12/15/2010   H/O dissecting abdominal aortic aneurysm repair 08/24/2010   IRRITABLE BOWEL SYNDROME 09/09/2009   Obstructive sleep apnea 08/04/2009   External hemorrhoid 06/26/2009   Osteoporosis 01/09/2009   Hyperlipidemia 07/26/2007   Depression with anxiety 07/26/2007   Essential hypertension 07/26/2007   Allergic rhinitis 07/26/2007   Asthma 07/26/2007   GERD 07/26/2007   Osteoarthritis 07/26/2007   Ocular migraine 07/26/2007   Mild intermittent asthma without complication 07/26/2007    PCP: [name] MD   REFERRING PROVIDER: Stormy Card  REFERRING DIAG: L rotator cuff   THERAPY DIAG:  Left shoulder pain, unspecified chronicity  Stiffness of left shoulder, not elsewhere classified  Abnormal posture  Rationale for Evaluation and Treatment: Rehabilitation  ONSET DATE: Feb 2024  SUBJECTIVE:  SUBJECTIVE STATEMENT: Patient presents with orders for the arm  Hand dominance: Left  PERTINENT HISTORY: Patient is a 67 year old female who has been seen in the clinic in the past. PMH includes aorta type A dissection in sept 2011 with aortic valve replacement bypass of R coronary artery, AAA, anemia, anxiety, CAD, CHF, depression, GERD, Heart failure, HLD, HTN, IBS, Kidney problems, migraine, osteoarthritis, osteoporosis, RA, ocular migraines, back pain. She was treated successfully in past for right shoulder pain and has past medical history of Right shoulder surgery replacement. 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 Patient fell and broke four metatarsals, is unsure of weightbearing orders. Is returned for L shoulder, went to her doctor was told to strengthen her arm.    PAIN:  Are you having pain? Yes: NPRS scale: 2/10 Pain location: L shoulder  Pain description: radiating, aching Aggravating factors: using it Relieving factors: ice At worst:6/10  At rest: 0/10 PRECAUTIONS: None    FALLS:  Has patient fallen in last 6 months? Yes. 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   NEXT MD VISIT:   OBJECTIVE:   DIAGNOSTIC FINDINGS:  IMPRESSION: Mild degenerative change in the cervical spine without acute fracture or traumatic subluxation.  IMPRESSION: Full width, full width tears of the supraspinatus and infraspinatus tendons with retraction to the glenoid. Grade 2/3 infraspinatus atrophy.   Moderate tendinosis of the distal subscapularis tendon with articular sided fraying of  the cephalad fibers.   Moderate intra-articular long head biceps tendinosis with fraying and possible partial tearing.   Mild glenohumeral and moderate AC joint osteoarthritis.   Degenerative superior labral tearing anteriorly and posteriorly.  PATIENT SURVEYS:  Quick Dash 50%  and FOTO 46%  COGNITION: Overall cognitive status: Within functional limits for tasks assessed     SENSATION: WFL  POSTURE: Slight rounded shoulders and shoulder elevation   UPPER EXTREMITY ROM:   Active ROM Right eval Left eval  Shoulder flexion 64 91  Shoulder extension    Shoulder abduction 95 52  Shoulder adduction    Shoulder internal rotation Unable to test at 90 5  Shoulder external rotation 18 34  Elbow flexion [facility] WFL  Elbow extension [facility] WFL  Wrist flexion    Wrist extension    Wrist ulnar deviation    Wrist radial deviation    Wrist pronation    Wrist supination    (Blank rows = not tested)  UPPER EXTREMITY MMT:  MMT Right eval Left eval  Shoulder flexion    Shoulder extension    Shoulder abduction    Shoulder adduction    Shoulder internal rotation    Shoulder external rotation    Middle trapezius    Lower trapezius    Elbow flexion    Elbow extension    Wrist flexion    Wrist extension    Wrist ulnar deviation    Wrist radial deviation    Wrist pronation    Wrist supination    Grip strength (lbs) 30 35  (Blank rows = not tested) Unable to test strength due to pain with motion  SHOULDER SPECIAL TESTS: Impingement tests:  unable to test due to pain in starting position  SLAP lesions:  unable to test due to pain in starting position Instability tests:  unable to test due to pain in starting position Rotator cuff assessment:  unable to test due to pain in starting position Biceps assessment:  unable to test due to pain in starting position  JOINT MOBILITY TESTING:  AP: painful PA: painful Inferior: painful Distraction: reduces pain   PALPATION:   Tenderness to all joint regions of GH    TODAY'S TREATMENT:                                                                                                                                         DATE: 07/12/23 Eval and HEP review   PATIENT EDUCATION: Education details: goals, POC  Person educated: Patient Education method: Explanation, Demonstration, Tactile cues, Verbal cues, and Handouts Education comprehension: verbalized understanding, returned demonstration, verbal cues required, and tactile cues required  HOME EXERCISE PROGRAM: Access Code: [email or web address] URL: [email or web address] Date: 07/11/2023 Prepared by: Precious Bard  Exercises - Seated Scapular Retraction  - 1 x daily - 7 x weekly - 2 sets - 10 reps - 5 hold - Neck Sidebending  - 1 x daily - 7 x weekly - 2 sets - 2 reps - 30 hold - Supine Isometric Shoulder Extension with Towel  - 1 x daily - 7 x weekly - 2 sets - 10 reps - 5 hold - Supine Isometric Shoulder Adduction with Towel Roll  - 1 x daily - 7 x weekly - 2 sets - 10 reps - 5 hold  ASSESSMENT:  CLINICAL IMPRESSION: Patient is a 67 y.o. female who was seen today for physical therapy evaluation and treatment for L shoulder. Patient's evaluation is limited by pain and limited ROM. She is unable to tolerate special tests due to limited ROM. She has some pain decrease with distraction and pain increase with mobilizations. Strength not tested due to pain in testing positions. She is educated on shoulder extension and adduction isometrics in combination with posture exercises for HEP. Patient will benefit from skilled physical therapy to reduce pain, improve ROM, and improve strength to return to PLOF.   OBJECTIVE IMPAIRMENTS: cardiopulmonary status limiting activity, decreased activity tolerance, decreased mobility, increased fascial restrictions, impaired perceived functional ability, increased muscle spasms, impaired flexibility, impaired UE functional use,  improper body mechanics, postural dysfunction, and pain.   ACTIVITY LIMITATIONS: carrying, lifting, bed mobility, bathing, dressing, self feeding, reach over head, and caring for others  PARTICIPATION LIMITATIONS: meal prep, cleaning, laundry, personal finances, interpersonal relationship, driving, shopping, community activity, yard work, church, and caregiving for husband  PERSONAL FACTORS: Age, Fitness, Past/current experiences, Time since onset of injury/illness/exacerbation, and 3+ comorbidities: aorta type A dissection in sept 2011 with aortic valve replacement bypass of R coronary artery, AAA, anemia, anxiety, CAD, CHF, depression, GERD, Heart failure, HLD, HTN, IBS, Kidney problems, migraine, osteoarthritis, osteoporosis, RA, ocular migraines, back pain  are also affecting patient's functional outcome.   REHAB POTENTIAL: Fair    CLINICAL DECISION MAKING: Evolving/moderate complexity  EVALUATION COMPLEXITY: Moderate   GOALS: Goals reviewed with patient? Yes  SHORT TERM GOALS: Target date: 08/09/2023    Patient will be independent in home exercise program to improve strength/mobility for better functional independence with ADLs. Baseline:7/23: HEP given  Goal status: INITIAL    LONG TERM GOALS: Target date: 09/05/2023    Patient will increase FOTO score to equal to or greater than  57%   to demonstrate statistically significant improvement in mobility and quality of life.  Baseline: 7/22: 46 Goal status: INITIAL  2.  Patient will improve shoulder AROM to > 140 degrees of flexion, scaption, and abduction for improved ability to perform overhead activities. Baseline: 7/22: see above Goal status: INITIAL  3.  Patient will demonstrate adequate shoulder ROM and strength to be able to shave and dress independently with pain less than 3/10. Baseline: 7/23: 6/10 pain with dressing, limited ROM, unable to test strength  Goal status: INITIAL  4.  Patient will decrease Quick DASH  score by > 8 points demonstrating reduced self-reported upper extremity disability. Baseline: 7/23: 50% Goal status: INITIAL  5.  Patient will be able to eat an entire meal without resting arm demonstrating improved functional strength Baseline: 7/23: has to rest while eating due to pain.  Goal status: INITIAL   PLAN:  PT FREQUENCY: 1x/week  PT DURATION: 8 weeks  PLANNED INTERVENTIONS: Therapeutic exercises, Therapeutic activity, Neuromuscular re-education, Balance training, Gait training, Patient/Family education, Self Care, Joint mobilization, Vestibular training, Canalith repositioning, Visual/preceptual remediation/compensation, Dry Needling, Electrical stimulation, Spinal mobilization, Cryotherapy, Moist heat, Compression bandaging, scar mobilization, Splintting, Taping, Traction, Ultrasound, Biofeedback, Ionotophoresis 4mg /ml Dexamethasone, Manual therapy, and Re-evaluation  PLAN FOR NEXT SESSION: isometrics, posture, upper trap release    Precious Bard, PT 07/12/2023, 9:08 AM

## 2023-07-12 ENCOUNTER — Ambulatory Visit (INDEPENDENT_AMBULATORY_CARE_PROVIDER_SITE_OTHER): Payer: Medicare HMO | Admitting: Family Medicine

## 2023-07-12 ENCOUNTER — Encounter: Payer: Self-pay | Admitting: Family Medicine

## 2023-07-12 VITALS — BP 122/65 | HR 76 | Temp 97.6°F | Ht 65.0 in | Wt 213.4 lb

## 2023-07-12 DIAGNOSIS — S92202D Fracture of unspecified tarsal bone(s) of left foot, subsequent encounter for fracture with routine healing: Secondary | ICD-10-CM | POA: Diagnosis not present

## 2023-07-12 DIAGNOSIS — F329 Major depressive disorder, single episode, unspecified: Secondary | ICD-10-CM | POA: Diagnosis not present

## 2023-07-12 DIAGNOSIS — M810 Age-related osteoporosis without current pathological fracture: Secondary | ICD-10-CM | POA: Diagnosis not present

## 2023-07-12 DIAGNOSIS — E2839 Other primary ovarian failure: Secondary | ICD-10-CM

## 2023-07-12 DIAGNOSIS — F418 Other specified anxiety disorders: Secondary | ICD-10-CM

## 2023-07-12 DIAGNOSIS — I10 Essential (primary) hypertension: Secondary | ICD-10-CM

## 2023-07-12 DIAGNOSIS — I4891 Unspecified atrial fibrillation: Secondary | ICD-10-CM | POA: Diagnosis not present

## 2023-07-12 DIAGNOSIS — M80072D Age-related osteoporosis with current pathological fracture, left ankle and foot, subsequent encounter for fracture with routine healing: Secondary | ICD-10-CM | POA: Diagnosis not present

## 2023-07-12 DIAGNOSIS — S92202S Fracture of unspecified tarsal bone(s) of left foot, sequela: Secondary | ICD-10-CM | POA: Diagnosis not present

## 2023-07-12 DIAGNOSIS — M519 Unspecified thoracic, thoracolumbar and lumbosacral intervertebral disc disorder: Secondary | ICD-10-CM | POA: Diagnosis not present

## 2023-07-12 DIAGNOSIS — S40021S Contusion of right upper arm, sequela: Secondary | ICD-10-CM | POA: Diagnosis not present

## 2023-07-12 DIAGNOSIS — F410 Panic disorder [episodic paroxysmal anxiety] without agoraphobia: Secondary | ICD-10-CM | POA: Diagnosis not present

## 2023-07-12 DIAGNOSIS — S40021D Contusion of right upper arm, subsequent encounter: Secondary | ICD-10-CM | POA: Diagnosis not present

## 2023-07-12 DIAGNOSIS — I25119 Atherosclerotic heart disease of native coronary artery with unspecified angina pectoris: Secondary | ICD-10-CM | POA: Diagnosis not present

## 2023-07-12 DIAGNOSIS — I429 Cardiomyopathy, unspecified: Secondary | ICD-10-CM | POA: Diagnosis not present

## 2023-07-12 DIAGNOSIS — I5042 Chronic combined systolic (congestive) and diastolic (congestive) heart failure: Secondary | ICD-10-CM | POA: Diagnosis not present

## 2023-07-12 DIAGNOSIS — I110 Hypertensive heart disease with heart failure: Secondary | ICD-10-CM | POA: Diagnosis not present

## 2023-07-12 DIAGNOSIS — S46012D Strain of muscle(s) and tendon(s) of the rotator cuff of left shoulder, subsequent encounter: Secondary | ICD-10-CM | POA: Diagnosis not present

## 2023-07-12 NOTE — Assessment & Plan Note (Signed)
BP: 122/65  Under cardiology care Controlled  Lisinopril 2.5 mg four days weekly Lasix 20 mg four days  Coreg 6.25 mg bid

## 2023-07-12 NOTE — Assessment & Plan Note (Addendum)
[facility]  Reviewed hospital records, lab results and studies in detail   Seeing ortho/Dr Miller-last note reviewed  Continues to wear boot  Has oxycodone-1 pill / and tramadol prn pain  Discussed fall precautions   Dexa ordered

## 2023-07-12 NOTE — Patient Instructions (Addendum)
Take care of yourself  Talk to orthopedics about the arm and referral to surgeon   Once your foot heals we will need to talk about your lumbar issue and possible foot drop and next steps   Continue counseling   Good luck on getting respite for your husband  BP was better on 2nd check    You have an order for:  []   2D Mammogram  []   3D Mammogram  [x]   Bone Density     Please call for appointment:   [x]   [facility]  [address] [place] [address]  [phone]  []   [facility])   [address]  [place], [place] [address]  [phone]  []   [facility]      [address] [place], [place]        [phone]         []   [facility]  [address] [place], [place]  [phone]  []  [facility]   [address]   [place], [place] [address]  [phone]  []  [facility]  [address] [place], [place] [address] [phone]    Make sure to wear two piece clothing  No lotions powders or deodorants the day of the appointment Make sure to bring picture ID and insurance card.  Bring list of medications you are currently taking including any supplements.   Schedule your screening mammogram through MyChart!   Select [place] imaging sites can now be scheduled through MyChart.  Log into your MyChart account.  Go to 'Visit' (or 'Appointments' if  on mobile App) --> Schedule an  Appointment  Under 'Select a Reason for Visit' choose the Mammogram  Screening option.  Complete the pre-visit questions  and select the time and place that  best fits your schedule

## 2023-07-12 NOTE — Assessment & Plan Note (Signed)
Continues counseling Stress continues to be high at home/ caregiving and her own medical issues Continues effexor xr 150 plus 37.5 mg daily

## 2023-07-12 NOTE — Assessment & Plan Note (Signed)
Bruised Symptoms are improved She is worried a pin may be loose from old surgery Plans to d/w ortho upcoming

## 2023-07-12 NOTE — Assessment & Plan Note (Signed)
Dexa ordered

## 2023-07-12 NOTE — Assessment & Plan Note (Signed)
Due for dexa Just suffered left foot fracture (metatarsals)-not a fragility fracture Discussed fall risk  Discussed supplements  Dexa ordered   Strength training recommended when able

## 2023-07-12 NOTE — Assessment & Plan Note (Signed)
Pt thinks recent fall was from foot drop going up stairs  When fractures are healed will follow up to discuss next steps  May need MRI Interested in neuro surg  Saw Dr Dutch Quint in past

## 2023-07-12 NOTE — Progress Notes (Signed)
Subjective:    Patient ID: Rachel Torres, female    DOB: 1956/11/17, 67 y.o.   MRN: [id number]  HPI  Wt Readings from Last 3 Encounters:  07/12/23 213 lb 6 oz (96.8 kg)  06/17/23 220 lb 8 oz (100 kg)  06/16/23 216 lb (98 kg)   35.51 kg/m  Vitals:   07/12/23 0952 07/12/23 1018  BP: (!) 150/82 122/65  Pulse: 76   Temp: 97.6 F (36.4 C)   SpO2: 98%     Pt presents for follow up of ER visits on 06/24/23 for fall with injuries  Larey Seat going up the stairs  Thinks she may have foot drop ?      Foot fracture (metatarsals 1 throught 4 non displaced)  Head injury  Contusions of face and right shoulder and elbow   Had ortho follow up with Dr Hyacinth Meeker on 7/10  As follows from note  Assessment and Plan: Left foot and right shoulder injury.  Recommendation: The patient will use an elastic shoulder immobilizer to support the forearm and shoulder. She will continue ankle foot orthosis for the left foot with an Ace bandage. She will take Mobic 50 mg daily with food for swelling. She will get home health PT and nursing care ordered.  The patient will follow up in 3 weeks for an exam and x-ray of the left foot 3 views and the right elbow.   Doing outpt rehab now    Pt feels a click when moving that arm  Things a pin is loose  May need follow up with surgeon  Has follow up with Dr Hyacinth Meeker and will discuss that   Has PT planned for left arm also  Surgeon does not want to do surgery    Trying to get husband in a 3 week respite  He is sun downing  Unsure if they will turn it down because he needs a sitter - needs a place   Is asking for some extra hours for care also      Last labs in [facility] Lab Results  Component Value Date   NA 140 02/17/2023   K 4.7 02/17/2023   CO2 23 02/17/2023   GLUCOSE 116 (H) 02/17/2023   BUN 14 02/17/2023   CREATININE 1.00 06/24/2023   CALCIUM 9.0 02/17/2023   GFR 75.60 03/17/2022   EGFR 76 02/17/2023   GFRNONAA 68 07/23/2020    Lab Results  Component Value Date   WBC 5.1 10/26/2022   HGB 13.6 10/26/2022   HCT 42.0 10/26/2022   MCV 98 (H) 10/26/2022   PLT 333 10/26/2022   Lab Results  Component Value Date   HGBA1C 5.9 (H) 02/17/2023   Last vitamin D Lab Results  Component Value Date   VD25OH 36.4 02/17/2023    Dexa 07/2021  Osteopenia      HTN bp is stable today  No cp or palpitations or headaches or edema  No side effects to medicines  BP Readings from Last 3 Encounters:  07/12/23 122/65  06/24/23 (!) 142/65  06/17/23 108/70   Lisinopril 2.5 mg four days weekly Lasix 20 mg four days  Coreg 6.25 mg bid   Sees cardiology   Thinks she has foot drop    Patient Active Problem List   Diagnosis Date Noted   Multiple closed tarsal fractures of left foot, sequela 07/12/2023   Arm contusion, right, sequela 07/12/2023   Lumbar disc disease 07/12/2023   Atrial fibrillation (HCC) 06/09/2023   Paresthesia  06/03/2023   BMI 34.0-34.9,adult 02/17/2023   Obesity, Beginning BMI 35.45 02/17/2023   Tinnitus 01/14/2023   Change in vision 01/14/2023   Colon cancer screening 01/14/2023   Personal history of fall 01/14/2023   Problem related to unspecified psychosocial circumstances 12/28/2022   Person encountering health services to consult on behalf of another person 12/28/2022   Palpitation 12/10/2022   Fatigue 10/26/2022   B12 deficiency 10/26/2022   Right shoulder pain 09/15/2022   Depression 09/14/2022   Class 2 severe obesity with serious comorbidity and body mass index (BMI) of 35.0 to 35.9 in adult [facility]) 09/14/2022   Other constipation 05/12/2022   Medicare annual wellness visit, initial 07/17/2021   TMJ (dislocation of temporomandibular joint) 07/17/2021   Encounter for screening mammogram for breast cancer 06/05/2021   Diarrhea 04/11/2020   Epigastric pain 02/19/2020   Dark stools 02/19/2020   History of atrial fibrillation 02/05/2019   Shortness of breath 01/27/2019   Irregular  surface of cornea 12/25/2018   HPV (human papilloma virus) infection 12/01/2016   Screening mammogram, encounter for 11/29/2016   Estrogen deficiency 11/29/2016   Encounter for routine gynecological examination 11/29/2016   Grade III hemorrhoids 10/06/2016   Tachycardia 08/14/2016   Hemorrhoids 08/13/2016   Atypical migraine 08/03/2016   CAD (coronary artery disease)    AAA (abdominal aortic aneurysm) (HCC)    Chronic combined systolic (congestive) and diastolic (congestive) heart failure (HCC)    Prediabetes 02/11/2016   Generalized anxiety disorder 12/08/2015   Panic attacks 07/28/2015   Chest pain 07/09/2013   Sensorineural hearing loss 03/27/2013   Vitamin D deficiency 07/03/2012   H/O aortic dissection 05/12/2012   Routine general medical examination at a health care facility 09/20/2011   Atypical chest pain 09/15/2011   S/P AVR (aortic valve replacement) 01/07/2011   CORONARY ARTERY BYPASS GRAFT, HX OF 01/07/2011   Arthritis 12/20/2010   Back pain 12/15/2010   H/O dissecting abdominal aortic aneurysm repair 08/24/2010   IRRITABLE BOWEL SYNDROME 09/09/2009   Obstructive sleep apnea 08/04/2009   External hemorrhoid 06/26/2009   Osteoporosis 01/09/2009   Hyperlipidemia 07/26/2007   Depression with anxiety 07/26/2007   Essential hypertension 07/26/2007   Allergic rhinitis 07/26/2007   Asthma 07/26/2007   GERD 07/26/2007   Osteoarthritis 07/26/2007   Ocular migraine 07/26/2007   Mild intermittent asthma without complication 07/26/2007   Past Medical History:  Diagnosis Date   AAA (abdominal aortic aneurysm) (HCC)    a. Chronic w/o evidence of aneurysmal dil on CTA 01/2016.   Alcohol abuse    Allergy    Anemia    Anxiety    Asthma    CAD (coronary artery disease)    a. 08/2010 s/p CABG x 1 (VG->RCA) @ time of Ao dissection repair; b. 01/2016 Lexiscan MV: mid antsept/apical defect w/ ? peri-infarct ischemia-->likely attenuation-->Med Rx.   Chewing difficulty    Chronic  combined systolic (congestive) and diastolic (congestive) heart failure (HCC)    a. 2012 EF 30-35%; b. 04/2015 EF 40-45%; c. 01/2016 Echo: Ef 55-65%, nl AoV bioprosthesis, nl RV, nl PASP.   Constipation    Depression    Edema of both lower extremities    Gallbladder sludge    GERD (gastroesophageal reflux disease)    Heart failure (HCC)    Heart valve problem    History of Bicuspid Aortic Valve    a. 08/2010 s/p AVR @ time of Ao dissection repair; b. 01/2016 Echo: Ef 55-65%, nl AoV bioprosthesis, nl RV, nl PASP.  Hx of repair of dissecting thoracic aortic aneurysm, Stanford type A    a. 08/2010 s/p repair with AVR and VG->RCA; b. 01/2016 CTA: stable appearance of Asc Thoracic Aortic graft. Opacification of flase lumen of chronic abd Ao dissection w/ retrograde flow through lumbar arteries. No aneurysmal dil of flase lumen.   Hyperlipidemia    Hypertension    Hypertensive heart disease    IBS (irritable bowel syndrome)    Internal hemorrhoids    Kidney problem    Marfan syndrome    pt denies   Migraine    Obstructive sleep apnea    Osteoarthritis    Osteoporosis    Palpitations    Pneumonia    RA (rheumatoid arthritis) (HCC)    a. Followed by Dr. Mallie Mussel   Past Surgical History:  Procedure Laterality Date   ABDOMINAL AORTIC ANEURYSM REPAIR     CARDIAC CATHETERIZATION  2001   CESAREAN SECTION  1986 & 1991   CHOLECYSTECTOMY     COLONOSCOPY     ESOPHAGOGASTRODUODENOSCOPY (EGD) WITH PROPOFOL N/A 05/08/2020   Procedure: ESOPHAGOGASTRODUODENOSCOPY (EGD) WITH PROPOFOL;  Surgeon: Pasty Spillers, MD;  Location: [facility] ENDOSCOPY;  Service: Endoscopy;  Laterality: N/A;   FRACTURE SURGERY Right    shoulder replacement   HEMORRHOID BANDING     ORIF DISTAL RADIUS FRACTURE Left    UPPER GASTROINTESTINAL ENDOSCOPY     Social History   Tobacco Use   Smoking status: Former    Current packs/day: 0.00    Average packs/day: 0.8 packs/day for 1.5 years (1.1 ttl pk-yrs)    Types: Cigarettes     Start date: 06/20/1977    Quit date: 12/20/1978    Years since quitting: 44.5   Smokeless tobacco: Never  Vaping Use   Vaping status: Never Used  Substance Use Topics   Alcohol use: Yes    Comment: 2-3 glasses of red wine per month   Drug use: No   Family History  Problem Relation Age of Onset   Hypertension Mother    Depression Mother    Osteoporosis Mother    Stroke Mother    Irritable bowel syndrome Mother    Asthma Mother    Heart disease Mother    Thyroid disease Mother    Anxiety disorder Mother    Other Father 53   Arthritis Sister    Diabetes Sister    Heart disease Sister    Asthma Sister    Migraines Sister    Diabetes Sister    Nephrolithiasis Sister    Osteoporosis Sister    Arthritis Sister    Asthma Sister    Migraines Sister    Diabetes Brother    Other Brother        prediabeties   Asthma Brother    Migraines Brother    Stroke Maternal Grandmother    Colon cancer Maternal Grandmother    Lymphoma Maternal Grandmother    Migraines Maternal Grandmother    Lung cancer Maternal Grandfather    Melanoma Maternal Grandfather    Aneurysm Paternal Grandmother        brain   Diabetes Paternal Grandmother    Arthritis Paternal Grandmother    Arthritis Paternal Grandfather    Diabetes Paternal Grandfather    Allergies  Allergen Reactions   Fosamax [Alendronate Sodium]     GI upset    Ginzing [Ginseng] Other (See Comments)    Hyper and jitery    Hydrocod Poli-Chlorphe Poli Er Itching   Hydrocodone-Acetaminophen Itching  Imitrex [Sumatriptan]     Contraindicated w/ heart condition   Oxycodone Itching   Peanut-Containing Drug Products Other (See Comments)    Reaction:  Migraines    Tramadol Other (See Comments)    itching   Hydrocodone-Guaifenesin Itching   Prednisone Rash   Tape Itching   Current Outpatient Medications on File Prior to Visit  Medication Sig Dispense Refill   acetaminophen (TYLENOL) 500 MG tablet Take 500 mg by mouth every 6  (six) hours as needed.     albuterol (VENTOLIN HFA) 108 (90 Base) MCG/ACT inhaler Inhale 1-2 puffs into the lungs every 6 (six) hours as needed for wheezing or shortness of breath. 3 each 3   ALPRAZolam (XANAX) 0.5 MG tablet Take 1 tablet (0.5 mg total) by mouth daily as needed for anxiety. 30 tablet 0   aspirin EC 81 MG tablet Take 81 mg by mouth at bedtime.     atorvastatin (LIPITOR) 20 MG tablet Take 20 mg by mouth. Four days per week     cetirizine (ZYRTEC) 10 MG tablet Take 1 tablet (10 mg total) by mouth daily as needed for allergies. 90 tablet 3   Cholecalciferol (VITAMIN D3) 50 MCG (2000 UT) CAPS Take 2,000 capsules by mouth. FOUR TIMES A WEEK     cyclobenzaprine (FLEXERIL) 10 MG tablet Take 0.5-1 tablets (5-10 mg total) by mouth 3 (three) times daily as needed. For headache 90 tablet 3   ELIQUIS 5 MG TABS tablet Take 1 tablet (5 mg total) by mouth 2 (two) times daily. 180 tablet 3   ezetimibe (ZETIA) 10 MG tablet Take 1 tablet (10 mg total) by mouth daily. 90 tablet 3   fluticasone (FLONASE) 50 MCG/ACT nasal spray Place 2 sprays into both nostrils daily as needed for allergies or rhinitis. 48 g 3   furosemide (LASIX) 20 MG tablet Take 40 mg by mouth daily.     hydrOXYzine (ATARAX) 25 MG tablet Take 25 mg by mouth 3 (three) times daily.     isosorbide mononitrate (IMDUR) 30 MG 24 hr tablet Take 0.5 tablets (15 mg total) by mouth daily. 45 tablet 3   lisinopril (ZESTRIL) 5 MG tablet Take 2.5 mg by mouth daily.     magnesium gluconate (MAGONATE) 500 MG tablet Take 500 mg by mouth at bedtime.     Melatonin 5 MG SUBL Take 10 mg by mouth at bedtime as needed.     nitroGLYCERIN (NITROSTAT) 0.4 MG SL tablet Place 1 tablet (0.4 mg total) under the tongue every 5 (five) minutes as needed for chest pain. 25 tablet 3   omeprazole (PRILOSEC) 40 MG capsule TAKE 1 CAPSULE EVERY DAY 90 capsule 2   oxyCODONE-acetaminophen (PERCOCET) 5-325 MG tablet Take 1 tablet by mouth every 4 (four) hours as needed for  severe pain. 20 tablet 0   polyethylene glycol powder (GLYCOLAX/MIRALAX) 17 GM/SCOOP powder Take 17 g by mouth as needed. 255 g 0   potassium chloride (KLOR-CON M) 10 MEQ tablet Take 1 tablet (10 mEq total) by mouth 2 (two) times daily. 180 tablet 3   promethazine (PHENERGAN) 25 MG tablet Take 0.5 tablets (12.5 mg total) by mouth every 8 (eight) hours as needed for nausea. 90 tablet 3   TART CHERRY PO Take 1 capsule by mouth 2 (two) times daily.     topiramate (TOPAMAX) 50 MG tablet Take 1 tablet (50 mg total) by mouth at bedtime. 30 tablet 0   venlafaxine XR (EFFEXOR-XR) 150 MG 24 hr capsule TAKE 1  CAPSULE EVERY DAY WITH BREAKFAST 90 capsule 2   venlafaxine XR (EFFEXOR-XR) 37.5 MG 24 hr capsule Take 1 capsule (37.5 mg total) by mouth daily with breakfast. 90 capsule 2   carvedilol (COREG) 12.5 MG tablet Take 1 tablet (12.5 mg total) by mouth 2 (two) times daily. 180 tablet 3   No current facility-administered medications on file prior to visit.    Review of Systems  Constitutional:  Negative for activity change, appetite change, fatigue, fever and unexpected weight change.  HENT:  Negative for congestion, ear pain, rhinorrhea, sinus pressure and sore throat.   Eyes:  Negative for pain, redness and visual disturbance.  Respiratory:  Negative for cough, shortness of breath and wheezing.   Cardiovascular:  Negative for chest pain and palpitations.  Gastrointestinal:  Negative for abdominal pain, blood in stool, constipation and diarrhea.  Endocrine: Negative for polydipsia and polyuria.  Genitourinary:  Negative for dysuria, frequency and urgency.  Musculoskeletal:  Positive for arthralgias, back pain and gait problem. Negative for joint swelling and myalgias.  Skin:  Negative for pallor and rash.  Allergic/Immunologic: Negative for environmental allergies.  Neurological:  Negative for dizziness, syncope and headaches.  Hematological:  Negative for adenopathy. Does not bruise/bleed easily.   Psychiatric/Behavioral:  Positive for decreased concentration. Negative for dysphoric mood. The patient is nervous/anxious.        Objective:   Physical Exam Constitutional:      General: She is not in acute distress.    Appearance: Normal appearance. She is obese. She is not ill-appearing or diaphoretic.  Eyes:     Conjunctiva/sclera: Conjunctivae normal.     Pupils: Pupils are equal, round, and reactive to light.  Cardiovascular:     Rate and Rhythm: Normal rate.     Heart sounds: Normal heart sounds.  Pulmonary:     Effort: Pulmonary effort is normal. No respiratory distress.     Breath sounds: Normal breath sounds. No wheezing.  Musculoskeletal:     Comments: Left foot in boot  Unable to assess for foot drop today   Bruising on right upper arm with mild swelling and tenderness   Mild kyphosis if any  Neurological:     Mental Status: She is alert.  Psychiatric:        Attention and Perception: Attention normal.        Mood and Affect: Mood is anxious.        Speech: Speech normal.        Cognition and Memory: Cognition and memory normal.     Comments: Pleasant Candidly discusses symptoms and stressors             Assessment & Plan:   Problem List Items Addressed This Visit       Cardiovascular and Mediastinum   Essential hypertension    BP: 122/65  Under cardiology care Controlled  Lisinopril 2.5 mg four days weekly Lasix 20 mg four days  Coreg 6.25 mg bid         Musculoskeletal and Integument   Osteoporosis    Due for dexa Just suffered left foot fracture (metatarsals)-not a fragility fracture Discussed fall risk  Discussed supplements  Dexa ordered   Strength training recommended when able       Multiple closed tarsal fractures of left foot, sequela - Primary    Seen in ER -[facility] [place]  Reviewed hospital records, lab results and studies in detail   Seeing ortho/Dr Miller-last note reviewed  Continues to wear boot  Has  oxycodone-1 pill / and tramadol prn pain  Discussed fall precautions   Dexa ordered       Lumbar disc disease    Pt thinks recent fall was from foot drop going up stairs  When fractures are healed will follow up to discuss next steps  May need MRI Interested in neuro surg  Saw Dr Dutch Quint in past         Other   Estrogen deficiency    Dexa ordered       Relevant Orders   DG Bone Density   Depression with anxiety    Continues counseling Stress continues to be high at home/ caregiving and her own medical issues Continues effexor xr 150 plus 37.5 mg daily       Arm contusion, right, sequela    Bruised Symptoms are improved She is worried a pin may be loose from old surgery Plans to d/w ortho upcoming

## 2023-07-13 ENCOUNTER — Ambulatory Visit: Payer: Medicare HMO

## 2023-07-13 DIAGNOSIS — I110 Hypertensive heart disease with heart failure: Secondary | ICD-10-CM | POA: Diagnosis not present

## 2023-07-13 DIAGNOSIS — M80072D Age-related osteoporosis with current pathological fracture, left ankle and foot, subsequent encounter for fracture with routine healing: Secondary | ICD-10-CM | POA: Diagnosis not present

## 2023-07-13 DIAGNOSIS — F410 Panic disorder [episodic paroxysmal anxiety] without agoraphobia: Secondary | ICD-10-CM | POA: Diagnosis not present

## 2023-07-13 DIAGNOSIS — F329 Major depressive disorder, single episode, unspecified: Secondary | ICD-10-CM | POA: Diagnosis not present

## 2023-07-13 DIAGNOSIS — I25119 Atherosclerotic heart disease of native coronary artery with unspecified angina pectoris: Secondary | ICD-10-CM | POA: Diagnosis not present

## 2023-07-13 DIAGNOSIS — S46012D Strain of muscle(s) and tendon(s) of the rotator cuff of left shoulder, subsequent encounter: Secondary | ICD-10-CM | POA: Diagnosis not present

## 2023-07-13 DIAGNOSIS — I4891 Unspecified atrial fibrillation: Secondary | ICD-10-CM | POA: Diagnosis not present

## 2023-07-13 DIAGNOSIS — I5042 Chronic combined systolic (congestive) and diastolic (congestive) heart failure: Secondary | ICD-10-CM | POA: Diagnosis not present

## 2023-07-13 DIAGNOSIS — I429 Cardiomyopathy, unspecified: Secondary | ICD-10-CM | POA: Diagnosis not present

## 2023-07-14 ENCOUNTER — Encounter (INDEPENDENT_AMBULATORY_CARE_PROVIDER_SITE_OTHER): Payer: Self-pay | Admitting: Family Medicine

## 2023-07-14 ENCOUNTER — Ambulatory Visit (INDEPENDENT_AMBULATORY_CARE_PROVIDER_SITE_OTHER): Payer: Medicare HMO | Admitting: Family Medicine

## 2023-07-14 VITALS — BP 113/73 | HR 78 | Temp 98.3°F | Ht 65.0 in | Wt 206.0 lb

## 2023-07-14 DIAGNOSIS — Z6834 Body mass index (BMI) 34.0-34.9, adult: Secondary | ICD-10-CM | POA: Diagnosis not present

## 2023-07-14 DIAGNOSIS — S92202S Fracture of unspecified tarsal bone(s) of left foot, sequela: Secondary | ICD-10-CM

## 2023-07-14 DIAGNOSIS — E669 Obesity, unspecified: Secondary | ICD-10-CM | POA: Diagnosis not present

## 2023-07-14 DIAGNOSIS — F3289 Other specified depressive episodes: Secondary | ICD-10-CM

## 2023-07-14 MED ORDER — TOPIRAMATE 50 MG PO TABS: 50.0000 mg | ORAL_TABLET | Freq: Every evening | ORAL | 0 refills | Status: DC

## 2023-07-18 NOTE — Progress Notes (Signed)
Chief Complaint:   OBESITY Rachel Torres is here to discuss her progress with her obesity treatment plan along with follow-up of her obesity related diagnoses. Rachel Torres is on following a lower carbohydrate, vegetable and lean protein rich diet plan and states she is following her eating plan approximately 80% of the time. Rachel Torres states she is doing physical therapy for 20 minutes 2 times per week.  Today's visit was #: 34 Starting weight: 213 lbs Starting date: 05/05/2020 Today's weight: 206 lbs Today's date: 07/14/2023 Total lbs lost to date: 7 Total lbs lost since last in-office visit: 10  Interim History: Patient has done well with her weight loss.  She has had extra challenges recently, but overall she has done well on her low carbohydrate plan.  Subjective:   1. Multiple closed tarsal fractures of left foot, sequela Patient is recovering from surgery.  She is working on proper nutrition to help with healing.  2. Emotional Eating Behavior Patient is working on decreasing emotional eating behavior.  She has increased stress with her health and her husband's health.  No side effects were noted on Topamax.  Assessment/Plan:   1. Multiple closed tarsal fractures of left foot, sequela Patient is to hold off on exercise until she is fully healed up.  2. Emotional Eating Behavior Patient will continue Topamax 50 mg nightly, and we will refill for 1 month.  - topiramate (TOPAMAX) 50 MG tablet; Take 1 tablet (50 mg total) by mouth at bedtime.  Dispense: 30 tablet; Refill: 0  3. BMI 34.0-34.9,adult  4. Obesity, Beginning BMI 35.45 Rachel Torres is currently in the action stage of change. As such, her goal is to continue with weight loss efforts. She has agreed to following a lower carbohydrate, vegetable and lean protein rich diet plan.   Exercise goals: As is.   Behavioral modification strategies: increasing lean protein intake and decreasing eating out.  Rachel Torres has agreed to  follow-up with our clinic in 4 weeks. She was informed of the importance of frequent follow-up visits to maximize her success with intensive lifestyle modifications for her multiple health conditions.   Objective:   Blood pressure 113/73, pulse 78, temperature 98.3 F (36.8 C), height 5\' 5"  (1.651 m), weight 206 lb (93.4 kg), SpO2 96%. Body mass index is 34.28 kg/m.  Lab Results  Component Value Date   CREATININE 1.00 06/24/2023   BUN 14 02/17/2023   NA 140 02/17/2023   K 4.7 02/17/2023   CL 101 02/17/2023   CO2 23 02/17/2023   Lab Results  Component Value Date   ALT 18 02/17/2023   AST 19 02/17/2023   ALKPHOS 78 02/17/2023   BILITOT 0.4 02/17/2023   Lab Results  Component Value Date   HGBA1C 5.9 (H) 02/17/2023   HGBA1C 5.8 (H) 10/26/2022   HGBA1C 5.5 03/17/2022   HGBA1C 5.5 12/03/2021   HGBA1C 5.6 07/10/2021   Lab Results  Component Value Date   INSULIN 22.4 02/17/2023   INSULIN 11.7 10/26/2022   INSULIN 4.5 12/03/2021   INSULIN 17.3 02/26/2021   INSULIN 13.9 10/29/2020   Lab Results  Component Value Date   TSH 1.280 10/26/2022   Lab Results  Component Value Date   CHOL 149 02/17/2023   HDL 64 02/17/2023   LDLCALC 68 02/17/2023   LDLDIRECT 189.4 10/13/2011   TRIG 95 02/17/2023   CHOLHDL 3 03/17/2022   Lab Results  Component Value Date   VD25OH 36.4 02/17/2023   VD25OH 57.6 10/26/2022   VD25OH  57.7 12/03/2021   Lab Results  Component Value Date   WBC 5.1 10/26/2022   HGB 13.6 10/26/2022   HCT 42.0 10/26/2022   MCV 98 (H) 10/26/2022   PLT 333 10/26/2022   Lab Results  Component Value Date   FERRITIN 105.0 12/11/2010   Attestation Statements:   Reviewed by clinician on day of visit: allergies, medications, problem list, medical history, surgical history, family history, social history, and previous encounter notes.   I, Burt Knack, am acting as transcriptionist for [name], MD.  I have reviewed the above documentation for accuracy  and completeness, and I agree with the above. -  [name], MD

## 2023-07-19 ENCOUNTER — Ambulatory Visit: Payer: Medicare HMO

## 2023-07-19 ENCOUNTER — Ambulatory Visit (INDEPENDENT_AMBULATORY_CARE_PROVIDER_SITE_OTHER): Payer: Medicare HMO | Admitting: Psychology

## 2023-07-19 DIAGNOSIS — F410 Panic disorder [episodic paroxysmal anxiety] without agoraphobia: Secondary | ICD-10-CM | POA: Diagnosis not present

## 2023-07-19 DIAGNOSIS — F331 Major depressive disorder, recurrent, moderate: Secondary | ICD-10-CM | POA: Diagnosis not present

## 2023-07-19 DIAGNOSIS — I429 Cardiomyopathy, unspecified: Secondary | ICD-10-CM | POA: Diagnosis not present

## 2023-07-19 DIAGNOSIS — F329 Major depressive disorder, single episode, unspecified: Secondary | ICD-10-CM | POA: Diagnosis not present

## 2023-07-19 DIAGNOSIS — S46012D Strain of muscle(s) and tendon(s) of the rotator cuff of left shoulder, subsequent encounter: Secondary | ICD-10-CM | POA: Diagnosis not present

## 2023-07-19 DIAGNOSIS — I5042 Chronic combined systolic (congestive) and diastolic (congestive) heart failure: Secondary | ICD-10-CM | POA: Diagnosis not present

## 2023-07-19 DIAGNOSIS — I25119 Atherosclerotic heart disease of native coronary artery with unspecified angina pectoris: Secondary | ICD-10-CM | POA: Diagnosis not present

## 2023-07-19 DIAGNOSIS — I110 Hypertensive heart disease with heart failure: Secondary | ICD-10-CM | POA: Diagnosis not present

## 2023-07-19 DIAGNOSIS — I4891 Unspecified atrial fibrillation: Secondary | ICD-10-CM | POA: Diagnosis not present

## 2023-07-19 DIAGNOSIS — M80072D Age-related osteoporosis with current pathological fracture, left ankle and foot, subsequent encounter for fracture with routine healing: Secondary | ICD-10-CM | POA: Diagnosis not present

## 2023-07-19 NOTE — Progress Notes (Signed)
[facility] Counselor/Therapist Progress Note  Patient ID: XENIA GILGENBACH, MRN: [id number],    Date: 07/19/2023  Time Spent: 10:00am-10:50am   50 minutes   Treatment Type: Individual Therapy  Reported Symptoms: stress  Mental Status Exam: Appearance:  Casual     Behavior: Appropriate  Motor: Normal  Speech/Language:  Normal Rate  Affect: Appropriate  Mood: normal  Thought process: normal  Thought content:   WNL  Sensory/Perceptual disturbances:   WNL  Orientation: oriented to person, place, time/date, and situation  Attention: Good  Concentration: Good  Memory: WNL  Fund of knowledge:  Good  Insight:   Good  Judgment:  Good  Impulse Control: Good   Risk Assessment: Danger to Self:  No Self-injurious Behavior: No Danger to Others: No Duty to Warn:no Physical Aggression / Violence:No  Access to Firearms a concern: No  Gang Involvement:No   Subjective: Pt present for face-to-face individual therapy via video.  Pt consents to telehealth video session and is aware of limitations of video sessions. Location of pt: home Location of therapist: home office.   Pt talked about being denied for placement for [name].   She is disappointed bc she really needs the respite care.  [name] is getting more confused which is stressful to deal with. Addressed pt's stress level.  Pt is worrying a lot. Worked on Pharmacologist.    Pt talked about her health.  She is still healing from her fall.   She still has significant pain at times.   Worked on self care strategies.  Provided supportive therapy.  Interventions: Cognitive Behavioral Therapy and Insight-Oriented  Diagnosis:  F33.1  Plan of Care: Recommend ongoing therapy.   Pt participated in setting treatment goals.   Pt needs a place to talk about stressors.  Plan to meet every two weeks.    Treatment Plan (target date: 03/23/2024) Client Abilities/Strengths  Pt is bright, engaging, and motivated for therapy.   Client  Treatment Preferences  Individual therapy.  Client Statement of Needs  Improve coping skills.  Have a place to talk about stressors.   Symptoms  Depressed or irritable mood. Unresolved grief issues.  Problems Addressed  Unipolar Depression Goals 1. Alleviate depressive symptoms and return to previous level of effective functioning. 2. Appropriately grieve the loss in order to normalize mood and to return to previously adaptive level of functioning. Objective Learn and implement behavioral strategies to overcome depression. Target Date: 2024-03-23 Frequency: Biweekly  Progress: 40 Modality: individual  Related Interventions Engage the client in "behavioral activation," increasing his/her activity level and contact with sources of reward, while identifying processes that inhibit activation.  Use behavioral techniques such as instruction, rehearsal, role-playing, role reversal, as needed, to facilitate activity in the client's daily life; reinforce success. Assist the client in developing skills that increase the likelihood of deriving pleasure from behavioral activation (e.g., assertiveness skills, developing an exercise plan, less internal/more external focus, increased social involvement); reinforce success. Objective Identify important people in life, past and present, and describe the quality, good and poor, of those relationships. Target Date: 2024-03-23 Frequency: Biweekly  Progress: 40 Modality: individual  Related Interventions Conduct Interpersonal Therapy beginning with the assessment of the client's "interpersonal inventory" of important past and present relationships; develop a case formulation linking depression to grief, interpersonal role disputes, role transitions, and/or interpersonal deficits). Objective Learn and implement problem-solving and decision-making skills. Target Date: 2024-03-23 Frequency: Biweekly  Progress: 40 Modality: individual  Related  Interventions Conduct Problem-Solving Therapy using techniques such  as psychoeducation, modeling, and role-playing to teach client problem-solving skills (i.e., defining a problem specifically, generating possible solutions, evaluating the pros and cons of each solution, selecting and implementing a plan of action, evaluating the efficacy of the plan, accepting or revising the plan); role-play application of the problem-solving skill to a real life issue. Encourage in the client the development of a positive problem orientation in which problems and solving them are viewed as a natural part of life and not something to be feared, despaired, or avoided. 3. Develop healthy interpersonal relationships that lead to the alleviation and help prevent the relapse of depression. 4. Develop healthy thinking patterns and beliefs about self, others, and the world that lead to the alleviation and help prevent the relapse of depression. 5. Recognize, accept, and cope with feelings of depression. Diagnosis F33.1  Conditions For Discharge Achievement of treatment goals and objectives   Salomon Fick, LCSW

## 2023-07-20 ENCOUNTER — Other Ambulatory Visit: Payer: Self-pay | Admitting: Family Medicine

## 2023-07-20 DIAGNOSIS — S92352A Displaced fracture of fifth metatarsal bone, left foot, initial encounter for closed fracture: Secondary | ICD-10-CM | POA: Diagnosis not present

## 2023-07-20 DIAGNOSIS — M25521 Pain in right elbow: Secondary | ICD-10-CM | POA: Diagnosis not present

## 2023-07-21 ENCOUNTER — Encounter: Payer: Self-pay | Admitting: Cardiovascular Disease

## 2023-07-21 MED ORDER — LISINOPRIL 5 MG PO TABS: 2.5000 mg | ORAL_TABLET | Freq: Every day | ORAL | 3 refills | Status: DC

## 2023-07-26 ENCOUNTER — Ambulatory Visit (INDEPENDENT_AMBULATORY_CARE_PROVIDER_SITE_OTHER): Payer: Medicare HMO | Admitting: Psychology

## 2023-07-26 DIAGNOSIS — F331 Major depressive disorder, recurrent, moderate: Secondary | ICD-10-CM

## 2023-07-26 NOTE — Progress Notes (Signed)
[facility] Counselor/Therapist Progress Note  Patient ID: Rachel Torres, MRN: [id number],    Date: 07/26/2023  Time Spent: 9:00am-9:50am   50 minutes   Treatment Type: Individual Therapy  Reported Symptoms: stress  Mental Status Exam: Appearance:  Casual     Behavior: Appropriate  Motor: Normal  Speech/Language:  Normal Rate  Affect: Appropriate  Mood: normal  Thought process: normal  Thought content:   WNL  Sensory/Perceptual disturbances:   WNL  Orientation: oriented to person, place, time/date, and situation  Attention: Good  Concentration: Good  Memory: WNL  Fund of knowledge:  Good  Insight:   Good  Judgment:  Good  Impulse Control: Good   Risk Assessment: Danger to Self:  No Self-injurious Behavior: No Danger to Others: No Duty to Warn:no Physical Aggression / Violence:No  Access to Firearms a concern: No  Gang Involvement:No   Subjective: Pt present for face-to-face individual therapy via video.  Pt consents to telehealth video session and is aware of limitations of video sessions. Location of pt: home Location of therapist: home office.   Pt talked about the stress of caregiving for Rachel Torres.  She feels like she needs more respite but [facility] Rachel Torres.  Pt is working with [employer] doctors to work on medication changes that can help improve Rachel Torres's cognition.  Addressed pt's stress level.  Pt is worrying a lot. Worked on Pharmacologist.    Pt talked about her health.  She is still healing from her fall.   She still has significant pain at times.  Pt plans to get a doctors appointment to address her concerns.  Worked on self care strategies.  Provided supportive therapy.  Interventions: Cognitive Behavioral Therapy and Insight-Oriented  Diagnosis:  F33.1  Plan of Care: Recommend ongoing therapy.   Pt participated in setting treatment goals.   Pt needs a place to talk about stressors.  Plan to meet every two weeks.    Treatment  Plan (target date: 03/23/2024) Client Abilities/Strengths  Pt is bright, engaging, and motivated for therapy.   Client Treatment Preferences  Individual therapy.  Client Statement of Needs  Improve coping skills.  Have a place to talk about stressors.   Symptoms  Depressed or irritable mood. Unresolved grief issues.  Problems Addressed  Unipolar Depression Goals 1. Alleviate depressive symptoms and return to previous level of effective functioning. 2. Appropriately grieve the loss in order to normalize mood and to return to previously adaptive level of functioning. Objective Learn and implement behavioral strategies to overcome depression. Target Date: 2024-03-23 Frequency: Biweekly  Progress: 40 Modality: individual  Related Interventions Engage the client in "behavioral activation," increasing his/her activity level and contact with sources of reward, while identifying processes that inhibit activation.  Use behavioral techniques such as instruction, rehearsal, role-playing, role reversal, as needed, to facilitate activity in the client's daily life; reinforce success. Assist the client in developing skills that increase the likelihood of deriving pleasure from behavioral activation (e.g., assertiveness skills, developing an exercise plan, less internal/more external focus, increased social involvement); reinforce success. Objective Identify important people in life, past and present, and describe the quality, good and poor, of those relationships. Target Date: 2024-03-23 Frequency: Biweekly  Progress: 40 Modality: individual  Related Interventions Conduct Interpersonal Therapy beginning with the assessment of the client's "interpersonal inventory" of important past and present relationships; develop a case formulation linking depression to grief, interpersonal role disputes, role transitions, and/or interpersonal deficits). Objective Learn and implement problem-solving and  decision-making skills. Target Date: 2024-03-23 Frequency: Biweekly  Progress: 40 Modality: individual  Related Interventions Conduct Problem-Solving Therapy using techniques such as psychoeducation, modeling, and role-playing to teach client problem-solving skills (i.e., defining a problem specifically, generating possible solutions, evaluating the pros and cons of each solution, selecting and implementing a plan of action, evaluating the efficacy of the plan, accepting or revising the plan); role-play application of the problem-solving skill to a real life issue. Encourage in the client the development of a positive problem orientation in which problems and solving them are viewed as a natural part of life and not something to be feared, despaired, or avoided. 3. Develop healthy interpersonal relationships that lead to the alleviation and help prevent the relapse of depression. 4. Develop healthy thinking patterns and beliefs about self, others, and the world that lead to the alleviation and help prevent the relapse of depression. 5. Recognize, accept, and cope with feelings of depression. Diagnosis F33.1  Conditions For Discharge Achievement of treatment goals and objectives   Salomon Fick, LCSW

## 2023-08-02 ENCOUNTER — Ambulatory Visit: Payer: Medicare HMO | Admitting: Psychology

## 2023-08-02 ENCOUNTER — Ambulatory Visit: Payer: Medicare HMO | Attending: Cardiovascular Disease

## 2023-08-02 DIAGNOSIS — M25612 Stiffness of left shoulder, not elsewhere classified: Secondary | ICD-10-CM | POA: Diagnosis not present

## 2023-08-02 DIAGNOSIS — R293 Abnormal posture: Secondary | ICD-10-CM | POA: Insufficient documentation

## 2023-08-02 DIAGNOSIS — F331 Major depressive disorder, recurrent, moderate: Secondary | ICD-10-CM | POA: Diagnosis not present

## 2023-08-02 DIAGNOSIS — M25512 Pain in left shoulder: Secondary | ICD-10-CM | POA: Insufficient documentation

## 2023-08-02 DIAGNOSIS — M6281 Muscle weakness (generalized): Secondary | ICD-10-CM | POA: Insufficient documentation

## 2023-08-02 NOTE — Progress Notes (Signed)
[facility] Counselor/Therapist Progress Note  Patient ID: HAILLY DOMINA, MRN: [id number],    Date: 08/02/2023  Time Spent: 10:00am-10:50am   50 minutes   Treatment Type: Individual Therapy  Reported Symptoms: stress  Mental Status Exam: Appearance:  Casual     Behavior: Appropriate  Motor: Normal  Speech/Language:  Normal Rate  Affect: Appropriate  Mood: normal  Thought process: normal  Thought content:   WNL  Sensory/Perceptual disturbances:   WNL  Orientation: oriented to person, place, time/date, and situation  Attention: Good  Concentration: Good  Memory: WNL  Fund of knowledge:  Good  Insight:   Good  Judgment:  Good  Impulse Control: Good   Risk Assessment: Danger to Self:  No Self-injurious Behavior: No Danger to Others: No Duty to Warn:no Physical Aggression / Violence:No  Access to Firearms a concern: No  Gang Involvement:No   Subjective: Pt present for face-to-face individual therapy via video.  Pt consents to telehealth video session and is aware of limitations of video sessions. Location of pt: home Location of therapist: home office.   Pt talked about her kitchen renovation project being delayed until the beginning of next year.   She is disappointed but is trying to look at the positives of waiting.   Pt talked about the stress of caregiving for [name].  She has many doctors appointments to take him to this week.    Pt is frustrated at how slow the [place] is being at processing her respite request.   Addressed pt's stress level.  Pt is worrying a lot. Worked on Pharmacologist.    Worked on self care strategies.  Provided supportive therapy.  Interventions: Cognitive Behavioral Therapy and Insight-Oriented  Diagnosis:  F33.1  Plan of Care: Recommend ongoing therapy.   Pt participated in setting treatment goals.   Pt needs a place to talk about stressors.  Plan to meet every two weeks.    Treatment Plan (target date: 03/23/2024) Client  Abilities/Strengths  Pt is bright, engaging, and motivated for therapy.   Client Treatment Preferences  Individual therapy.  Client Statement of Needs  Improve coping skills.  Have a place to talk about stressors.   Symptoms  Depressed or irritable mood. Unresolved grief issues.  Problems Addressed  Unipolar Depression Goals 1. Alleviate depressive symptoms and return to previous level of effective functioning. 2. Appropriately grieve the loss in order to normalize mood and to return to previously adaptive level of functioning. Objective Learn and implement behavioral strategies to overcome depression. Target Date: 2024-03-23 Frequency: Biweekly  Progress: 40 Modality: individual  Related Interventions Engage the client in "behavioral activation," increasing his/her activity level and contact with sources of reward, while identifying processes that inhibit activation.  Use behavioral techniques such as instruction, rehearsal, role-playing, role reversal, as needed, to facilitate activity in the client's daily life; reinforce success. Assist the client in developing skills that increase the likelihood of deriving pleasure from behavioral activation (e.g., assertiveness skills, developing an exercise plan, less internal/more external focus, increased social involvement); reinforce success. Objective Identify important people in life, past and present, and describe the quality, good and poor, of those relationships. Target Date: 2024-03-23 Frequency: Biweekly  Progress: 40 Modality: individual  Related Interventions Conduct Interpersonal Therapy beginning with the assessment of the client's "interpersonal inventory" of important past and present relationships; develop a case formulation linking depression to grief, interpersonal role disputes, role transitions, and/or interpersonal deficits). Objective Learn and implement problem-solving and decision-making skills. Target Date: 2024-03-23  Frequency: Biweekly  Progress: 40 Modality: individual  Related Interventions Conduct Problem-Solving Therapy using techniques such as psychoeducation, modeling, and role-playing to teach client problem-solving skills (i.e., defining a problem specifically, generating possible solutions, evaluating the pros and cons of each solution, selecting and implementing a plan of action, evaluating the efficacy of the plan, accepting or revising the plan); role-play application of the problem-solving skill to a real life issue. Encourage in the client the development of a positive problem orientation in which problems and solving them are viewed as a natural part of life and not something to be feared, despaired, or avoided. 3. Develop healthy interpersonal relationships that lead to the alleviation and help prevent the relapse of depression. 4. Develop healthy thinking patterns and beliefs about self, others, and the world that lead to the alleviation and help prevent the relapse of depression. 5. Recognize, accept, and cope with feelings of depression. Diagnosis F33.1  Conditions For Discharge Achievement of treatment goals and objectives   Salomon Fick, LCSW

## 2023-08-02 NOTE — Therapy (Signed)
OUTPATIENT PHYSICAL THERAPY TREATMENT   Patient Name: Rachel Torres MRN: [id number] DOB:1956-08-31, 67 y.o., female Today's Date: 08/02/2023  END OF SESSION:  PT End of Session - 08/02/23 1146     Visit Number 2    Number of Visits 8    Date for PT Re-Evaluation 09/05/23    Authorization Type [employer]  Medicare HMO    Authorization Time Period 07/11/23-09/05/23    Progress Note Due on Visit 10    PT Start Time 1140    PT Stop Time 1220    PT Time Calculation (min) 40 min    Activity Tolerance Patient tolerated treatment well    Behavior During Therapy WFL for tasks assessed/performed             Past Medical History:  Diagnosis Date   AAA (abdominal aortic aneurysm) (HCC)    a. Chronic w/o evidence of aneurysmal dil on CTA 01/2016.   Alcohol abuse    Allergy    Anemia    Anxiety    Asthma    CAD (coronary artery disease)    a. 08/2010 s/p CABG x 1 (VG->RCA) @ time of Ao dissection repair; b. 01/2016 Lexiscan MV: mid antsept/apical defect w/ ? peri-infarct ischemia-->likely attenuation-->Med Rx.   Chewing difficulty    Chronic combined systolic (congestive) and diastolic (congestive) heart failure (HCC)    a. 2012 EF 30-35%; b. 04/2015 EF 40-45%; c. 01/2016 Echo: Ef 55-65%, nl AoV bioprosthesis, nl RV, nl PASP.   Constipation    Depression    Edema of both lower extremities    Gallbladder sludge    GERD (gastroesophageal reflux disease)    Heart failure (HCC)    Heart valve problem    History of Bicuspid Aortic Valve    a. 08/2010 s/p AVR @ time of Ao dissection repair; b. 01/2016 Echo: Ef 55-65%, nl AoV bioprosthesis, nl RV, nl PASP.   Hx of repair of dissecting thoracic aortic aneurysm, Stanford type A    a. 08/2010 s/p repair with AVR and VG->RCA; b. 01/2016 CTA: stable appearance of Asc Thoracic Aortic graft. Opacification of flase lumen of chronic abd Ao dissection w/ retrograde flow through lumbar arteries. No aneurysmal dil of flase lumen.   Hyperlipidemia     Hypertension    Hypertensive heart disease    IBS (irritable bowel syndrome)    Internal hemorrhoids    Kidney problem    Marfan syndrome    pt denies   Migraine    Obstructive sleep apnea    Osteoarthritis    Osteoporosis    Palpitations    Pneumonia    RA (rheumatoid arthritis) (HCC)    a. Followed by Dr. Mallie Mussel   Past Surgical History:  Procedure Laterality Date   ABDOMINAL AORTIC ANEURYSM REPAIR     CARDIAC CATHETERIZATION  2001   CESAREAN SECTION  1986 & 1991   CHOLECYSTECTOMY     COLONOSCOPY     ESOPHAGOGASTRODUODENOSCOPY (EGD) WITH PROPOFOL N/A 05/08/2020   Procedure: ESOPHAGOGASTRODUODENOSCOPY (EGD) WITH PROPOFOL;  Surgeon: Pasty Spillers, MD;  Location: [facility] ENDOSCOPY;  Service: Endoscopy;  Laterality: N/A;   FRACTURE SURGERY Right    shoulder replacement   HEMORRHOID BANDING     ORIF DISTAL RADIUS FRACTURE Left    UPPER GASTROINTESTINAL ENDOSCOPY     Patient Active Problem List   Diagnosis Date Noted   Multiple closed tarsal fractures of left foot, sequela 07/12/2023   Arm contusion, right, sequela 07/12/2023   Lumbar disc disease  07/12/2023   Atrial fibrillation (HCC) 06/09/2023   Paresthesia 06/03/2023   BMI 34.0-34.9,adult 02/17/2023   Obesity, Beginning BMI 35.45 02/17/2023   Tinnitus 01/14/2023   Change in vision 01/14/2023   Colon cancer screening 01/14/2023   Personal history of fall 01/14/2023   Problem related to unspecified psychosocial circumstances 12/28/2022   Person encountering health services to consult on behalf of another person 12/28/2022   Palpitation 12/10/2022   Fatigue 10/26/2022   B12 deficiency 10/26/2022   Right shoulder pain 09/15/2022   Depression 09/14/2022   Class 2 severe obesity with serious comorbidity and body mass index (BMI) of 35.0 to 35.9 in adult [facility]) 09/14/2022   Other constipation 05/12/2022   Medicare annual wellness visit, initial 07/17/2021   TMJ (dislocation of temporomandibular joint) 07/17/2021    Encounter for screening mammogram for breast cancer 06/05/2021   Diarrhea 04/11/2020   Epigastric pain 02/19/2020   Dark stools 02/19/2020   History of atrial fibrillation 02/05/2019   Shortness of breath 01/27/2019   Irregular surface of cornea 12/25/2018   HPV (human papilloma virus) infection 12/01/2016   Screening mammogram, encounter for 11/29/2016   Estrogen deficiency 11/29/2016   Encounter for routine gynecological examination 11/29/2016   Grade III hemorrhoids 10/06/2016   Tachycardia 08/14/2016   Hemorrhoids 08/13/2016   Atypical migraine 08/03/2016   CAD (coronary artery disease)    AAA (abdominal aortic aneurysm) (HCC)    Chronic combined systolic (congestive) and diastolic (congestive) heart failure (HCC)    Prediabetes 02/11/2016   Generalized anxiety disorder 12/08/2015   Panic attacks 07/28/2015   Chest pain 07/09/2013   Sensorineural hearing loss 03/27/2013   Vitamin D deficiency 07/03/2012   H/O aortic dissection 05/12/2012   Routine general medical examination at a health care facility 09/20/2011   Atypical chest pain 09/15/2011   S/P AVR (aortic valve replacement) 01/07/2011   CORONARY ARTERY BYPASS GRAFT, HX OF 01/07/2011   Arthritis 12/20/2010   Back pain 12/15/2010   H/O dissecting abdominal aortic aneurysm repair 08/24/2010   IRRITABLE BOWEL SYNDROME 09/09/2009   Obstructive sleep apnea 08/04/2009   External hemorrhoid 06/26/2009   Osteoporosis 01/09/2009   Hyperlipidemia 07/26/2007   Depression with anxiety 07/26/2007   Essential hypertension 07/26/2007   Allergic rhinitis 07/26/2007   Asthma 07/26/2007   GERD 07/26/2007   Osteoarthritis 07/26/2007   Ocular migraine 07/26/2007   Mild intermittent asthma without complication 07/26/2007    PCP: Rachel Pimple MD   REFERRING PROVIDER: Stormy Torres  REFERRING DIAG: L rotator cuff   THERAPY DIAG:  Left shoulder pain, unspecified chronicity  Stiffness of left shoulder, not elsewhere  classified  Abnormal posture  Rationale for Evaluation and Treatment: Rehabilitation  ONSET DATE: Feb 2024  SUBJECTIVE:  SUBJECTIVE STATEMENT: Pt doing ok since last visit. Now cleared from boot use.   Hand dominance: Left  PERTINENT HISTORY: Rachel Torres is a 66yoF familiar to our services. Pt referred to PT for strengthening of chronic left shoulder DJD, known remote RC injury. Recently fell and broke four metatarsals, since cleared from use of CAM rocker. She was treated successfully in past for right shoulder pain and has past medical history of Right shoulder surgery replacement. 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 PMH aorta type A dissection in sept 2011 s/p aortic valve replacement, bypass of R coronary artery, anemia, anxiety, CAD, CHF, depression, GERD, Heart failure, HLD, HTN, IBS, migraine, osteoarthritis, osteoporosis, RA, back pain.     PAIN:  Are you having pain? No  PRECAUTIONS: None  FALLS:  Has patient fallen in last 6 months? Yes. Number of falls 2  PATIENT GOALS:to lift more than 3lb with LUE, be able to eat a meal without arm being tired.    OBJECTIVE:    TODAY'S TREATMENT:                                                                                                                                         DATE: 08/02/23  -Review of HEP as assigned: scap retraction x10, cervical lateral flexion 2x30sc bilat, Lt shoulder adduction squeeze, shoulder extension sqeeze  -seated green ball IR isometric squeeze x10 -seated ER isometric 10x5secH with gait belt resistance, supinated (lt axillary towel roll)  -towel slides 3x30sec   -1lb elbow flexion 1x10  -yellow TB LUE tricep elbow extension 1x10 (neck scarf)  -1lb elbow flexion 1x10  -yellow TB LUE tricep elbow  extension 1x10 (neck scarf)   -yellow TB row (deferred to time)   PATIENT EDUCATION: Education details: goals, POC  Person educated: Patient Education method: Explanation, Demonstration, Tactile cues, Verbal cues, and Handouts Education comprehension: verbalized understanding, returned demonstration, verbal cues required, and tactile cues required  HOME EXERCISE PROGRAM: Access Code: [email or web address] URL: [email or web address] Date: 07/11/2023 Prepared by: Precious Bard  Exercises - Seated Scapular Retraction  - 1 x daily - 7 x weekly - 2 sets - 10 reps - 5 hold - Neck Sidebending  - 1 x daily - 7 x weekly - 2 sets - 2 reps - 30 hold - Supine Isometric Shoulder Extension with Towel  - 1 x daily - 7 x weekly - 2 sets - 10 reps - 5 hold - Supine Isometric Shoulder Adduction with Towel Roll  - 1 x daily - 7 x weekly - 2 sets - 10 reps - 5 hold  - table slides! 8/13   ASSESSMENT:  CLINICAL IMPRESSION: Reviewed HEP and symptoms response. Added in additional directions for shoulder isometrics. Included loading for Left elbow as well which is weak. Pt gives helpful feedback on response to loading in session- some things improve with loading, some things are painful, some are  position dependent, others simply feel an ischaemic muscle burn typical with moderate resistance loading. Added table slides to HEP, but no other items added today. Patient will benefit from skilled physical therapy to reduce pain, improve ROM, and improve strength to return to PLOF.   OBJECTIVE IMPAIRMENTS: cardiopulmonary status limiting activity, decreased activity tolerance, decreased mobility, increased fascial restrictions, impaired perceived functional ability, increased muscle spasms, impaired flexibility, impaired UE functional use, improper body mechanics, postural dysfunction, and pain.   ACTIVITY LIMITATIONS: carrying, lifting, bed mobility, bathing, dressing, self feeding, reach over head, and caring for  others  PARTICIPATION LIMITATIONS: meal prep, cleaning, laundry, personal finances, interpersonal relationship, driving, shopping, community activity, yard work, church, and caregiving for husband  PERSONAL FACTORS: Age, Fitness, Past/current experiences, Time since onset of injury/illness/exacerbation, and 3+ comorbidities: aorta type A dissection in sept 2011 with aortic valve replacement bypass of R coronary artery, AAA, anemia, anxiety, CAD, CHF, depression, GERD, Heart failure, HLD, HTN, IBS, Kidney problems, migraine, osteoarthritis, osteoporosis, RA, ocular migraines, back pain  are also affecting patient's functional outcome.   REHAB POTENTIAL: Fair    CLINICAL DECISION MAKING: Evolving/moderate complexity  EVALUATION COMPLEXITY: Moderate   GOALS: Goals reviewed with patient? Yes  SHORT TERM GOALS: Target date: 08/09/2023   Patient will be independent in home exercise program to improve strength/mobility for better functional independence with ADLs. Baseline:7/23: HEP given  Goal status: INITIAL    LONG TERM GOALS: Target date: 09/05/2023   Patient will increase FOTO score to equal to or greater than  57%   to demonstrate statistically significant improvement in mobility and quality of life.  Baseline: 7/22: 46 Goal status: INITIAL  2.  Patient will improve shoulder AROM to > 140 degrees of flexion, scaption, and abduction for improved ability to perform overhead activities. Baseline: 7/22: see above Goal status: INITIAL  3.  Patient will demonstrate adequate shoulder ROM and strength to be able to shave and dress independently with pain less than 3/10. Baseline: 7/23: 6/10 pain with dressing, limited ROM, unable to test strength  Goal status: INITIAL  4.  Patient will decrease Quick DASH score by > 8 points demonstrating reduced self-reported upper extremity disability. Baseline: 7/23: 50% Goal status: INITIAL  5.  Patient will be able to eat an entire meal without  resting arm demonstrating improved functional strength Baseline: 7/23: has to rest while eating due to pain.  Goal status: INITIAL   PLAN:  PT FREQUENCY: 1x/week  PT DURATION: 8 weeks  PLANNED INTERVENTIONS: Therapeutic exercises, Therapeutic activity, Neuromuscular re-education, Balance training, Gait training, Patient/Family education, Self Care, Joint mobilization, Vestibular training, Canalith repositioning, Visual/preceptual remediation/compensation, Dry Needling, Electrical stimulation, Spinal mobilization, Cryotherapy, Moist heat, Compression bandaging, scar mobilization, Splintting, Taping, Traction, Ultrasound, Biofeedback, Ionotophoresis 4mg /ml Dexamethasone, Manual therapy, and Re-evaluation  PLAN FOR NEXT SESSION:   Continue with LUE strengthening ,progress as tolerated.    , C, PT 08/02/2023, 11:55 AM   11:56 AM, 08/02/23 Rosamaria Lints, PT, DPT Physical Therapist - [facility]  Outpatient Physical Therapy- Main Campus [phone]

## 2023-08-08 ENCOUNTER — Ambulatory Visit (INDEPENDENT_AMBULATORY_CARE_PROVIDER_SITE_OTHER): Payer: Medicare HMO | Admitting: Psychology

## 2023-08-08 DIAGNOSIS — F331 Major depressive disorder, recurrent, moderate: Secondary | ICD-10-CM | POA: Diagnosis not present

## 2023-08-08 NOTE — Therapy (Signed)
OUTPATIENT PHYSICAL THERAPY TREATMENT   Patient Name: Rachel Torres MRN: [id number] DOB:1956/09/05, 67 y.o., female Today's Date: 08/09/2023  END OF SESSION:  PT End of Session - 08/09/23 1359     Visit Number 3    Number of Visits 8    Date for PT Re-Evaluation 09/05/23    Authorization Type [employer]  Medicare HMO    Authorization Time Period 07/11/23-09/05/23    Progress Note Due on Visit 10    PT Start Time 1400    PT Stop Time 1446    PT Time Calculation (min) 46 min    Activity Tolerance Patient tolerated treatment well    Behavior During Therapy WFL for tasks assessed/performed              Past Medical History:  Diagnosis Date   AAA (abdominal aortic aneurysm) (HCC)    a. Chronic w/o evidence of aneurysmal dil on CTA 01/2016.   Alcohol abuse    Allergy    Anemia    Anxiety    Asthma    CAD (coronary artery disease)    a. 08/2010 s/p CABG x 1 (VG->RCA) @ time of Ao dissection repair; b. 01/2016 Lexiscan MV: mid antsept/apical defect w/ ? peri-infarct ischemia-->likely attenuation-->Med Rx.   Chewing difficulty    Chronic combined systolic (congestive) and diastolic (congestive) heart failure (HCC)    a. 2012 EF 30-35%; b. 04/2015 EF 40-45%; c. 01/2016 Echo: Ef 55-65%, nl AoV bioprosthesis, nl RV, nl PASP.   Constipation    Depression    Edema of both lower extremities    Gallbladder sludge    GERD (gastroesophageal reflux disease)    Heart failure (HCC)    Heart valve problem    History of Bicuspid Aortic Valve    a. 08/2010 s/p AVR @ time of Ao dissection repair; b. 01/2016 Echo: Ef 55-65%, nl AoV bioprosthesis, nl RV, nl PASP.   Hx of repair of dissecting thoracic aortic aneurysm, Stanford type A    a. 08/2010 s/p repair with AVR and VG->RCA; b. 01/2016 CTA: stable appearance of Asc Thoracic Aortic graft. Opacification of flase lumen of chronic abd Ao dissection w/ retrograde flow through lumbar arteries. No aneurysmal dil of flase lumen.   Hyperlipidemia     Hypertension    Hypertensive heart disease    IBS (irritable bowel syndrome)    Internal hemorrhoids    Kidney problem    Marfan syndrome    pt denies   Migraine    Obstructive sleep apnea    Osteoarthritis    Osteoporosis    Palpitations    Pneumonia    RA (rheumatoid arthritis) (HCC)    a. Followed by Dr. Mallie Mussel   Past Surgical History:  Procedure Laterality Date   ABDOMINAL AORTIC ANEURYSM REPAIR     CARDIAC CATHETERIZATION  2001   CESAREAN SECTION  1986 & 1991   CHOLECYSTECTOMY     COLONOSCOPY     ESOPHAGOGASTRODUODENOSCOPY (EGD) WITH PROPOFOL N/A 05/08/2020   Procedure: ESOPHAGOGASTRODUODENOSCOPY (EGD) WITH PROPOFOL;  Surgeon: Pasty Spillers, MD;  Location: [facility] ENDOSCOPY;  Service: Endoscopy;  Laterality: N/A;   FRACTURE SURGERY Right    shoulder replacement   HEMORRHOID BANDING     ORIF DISTAL RADIUS FRACTURE Left    UPPER GASTROINTESTINAL ENDOSCOPY     Patient Active Problem List   Diagnosis Date Noted   Multiple closed tarsal fractures of left foot, sequela 07/12/2023   Arm contusion, right, sequela 07/12/2023   Lumbar disc  disease 07/12/2023   Atrial fibrillation (HCC) 06/09/2023   Paresthesia 06/03/2023   BMI 34.0-34.9,adult 02/17/2023   Obesity, Beginning BMI 35.45 02/17/2023   Tinnitus 01/14/2023   Change in vision 01/14/2023   Colon cancer screening 01/14/2023   Personal history of fall 01/14/2023   Problem related to unspecified psychosocial circumstances 12/28/2022   Person encountering health services to consult on behalf of another person 12/28/2022   Palpitation 12/10/2022   Fatigue 10/26/2022   B12 deficiency 10/26/2022   Right shoulder pain 09/15/2022   Depression 09/14/2022   Class 2 severe obesity with serious comorbidity and body mass index (BMI) of 35.0 to 35.9 in adult [facility]) 09/14/2022   Other constipation 05/12/2022   Medicare annual wellness visit, initial 07/17/2021   TMJ (dislocation of temporomandibular joint) 07/17/2021    Encounter for screening mammogram for breast cancer 06/05/2021   Diarrhea 04/11/2020   Epigastric pain 02/19/2020   Dark stools 02/19/2020   History of atrial fibrillation 02/05/2019   Shortness of breath 01/27/2019   Irregular surface of cornea 12/25/2018   HPV (human papilloma virus) infection 12/01/2016   Screening mammogram, encounter for 11/29/2016   Estrogen deficiency 11/29/2016   Encounter for routine gynecological examination 11/29/2016   Grade III hemorrhoids 10/06/2016   Tachycardia 08/14/2016   Hemorrhoids 08/13/2016   Atypical migraine 08/03/2016   CAD (coronary artery disease)    AAA (abdominal aortic aneurysm) (HCC)    Chronic combined systolic (congestive) and diastolic (congestive) heart failure (HCC)    Prediabetes 02/11/2016   Generalized anxiety disorder 12/08/2015   Panic attacks 07/28/2015   Chest pain 07/09/2013   Sensorineural hearing loss 03/27/2013   Vitamin D deficiency 07/03/2012   H/O aortic dissection 05/12/2012   Routine general medical examination at a health care facility 09/20/2011   Atypical chest pain 09/15/2011   S/P AVR (aortic valve replacement) 01/07/2011   CORONARY ARTERY BYPASS GRAFT, HX OF 01/07/2011   Arthritis 12/20/2010   Back pain 12/15/2010   H/O dissecting abdominal aortic aneurysm repair 08/24/2010   IRRITABLE BOWEL SYNDROME 09/09/2009   Obstructive sleep apnea 08/04/2009   External hemorrhoid 06/26/2009   Osteoporosis 01/09/2009   Hyperlipidemia 07/26/2007   Depression with anxiety 07/26/2007   Essential hypertension 07/26/2007   Allergic rhinitis 07/26/2007   Asthma 07/26/2007   GERD 07/26/2007   Osteoarthritis 07/26/2007   Ocular migraine 07/26/2007   Mild intermittent asthma without complication 07/26/2007    PCP: [name] MD   REFERRING PROVIDER: Stormy Card  REFERRING DIAG: L rotator cuff   THERAPY DIAG:  Left shoulder pain, unspecified chronicity  Stiffness of left shoulder, not elsewhere  classified  Abnormal posture  Muscle weakness (generalized)  Rationale for Evaluation and Treatment: Rehabilitation  ONSET DATE: Feb 2024  SUBJECTIVE:  SUBJECTIVE STATEMENT: Patient reports 1/10 pain R, 2/10 pain on L. Foot is very painful.   Hand dominance: Left  PERTINENT HISTORY: Khania Pasternack is a 66yoF familiar to our services. Pt referred to PT for strengthening of chronic left shoulder DJD, known remote RC injury. Recently fell and broke four metatarsals, since cleared from use of CAM rocker. She was treated successfully in past for right shoulder pain and has past medical history of Right shoulder surgery replacement. 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 PMH aorta type A dissection in sept 2011 s/p aortic valve replacement, bypass of R coronary artery, anemia, anxiety, CAD, CHF, depression, GERD, Heart failure, HLD, HTN, IBS, migraine, osteoarthritis, osteoporosis, RA, back pain.     PAIN:  Are you having pain? No  PRECAUTIONS: None  FALLS:  Has patient fallen in last 6 months? Yes. Number of falls 2  PATIENT GOALS:to lift more than 3lb with LUE, be able to eat a meal without arm being tired.    OBJECTIVE:    TODAY'S TREATMENT:                                                                                                                                         DATE: 08/09/23  Supine:  Isometric contraction pressing into PT hand; 10x 5 seconds in the following positions:cues for 50% contraction -flexion, abduction, extension, adduction  Overhead Y GTB 10x: in painfree range Scapular punches modified 10x  Manual: GH distraction 3x30 seconds each LE Grade II mobilization thoracic spine x6 minutes J mobilization 3x30 seconds  Trigger Point Dry Needling (TDN),  unbilled Education performed with patient regarding potential benefit of TDN. Reviewed precautions and risks with patient. Reviewed special precautions/risks over lung fields which include pneumothorax. Reviewed signs and symptoms of pneumothorax and advised pt to go to ER immediately if these symptoms develop advise them of dry needling treatment. Extensive time spent with pt to ensure full understanding of TDN risks. Pt provided verbal consent to treatment. TDN performed to  with 0.25 x 40 single needle placements with local twitch response (LTR). Pistoning technique utilized. Improved pain-free motion following intervention. Bilateral upper traps prone position x4 minutes   PATIENT EDUCATION: Education details: goals, POC  Person educated: Patient Education method: Explanation, Demonstration, Tactile cues, Verbal cues, and Handouts Education comprehension: verbalized understanding, returned demonstration, verbal cues required, and tactile cues required  HOME EXERCISE PROGRAM: Access Code: [email or web address] URL: [email or web address] Date: 07/11/2023 Prepared by: Precious Bard  Exercises - Seated Scapular Retraction  - 1 x daily - 7 x weekly - 2 sets - 10 reps - 5 hold - Neck Sidebending  - 1 x daily - 7 x weekly - 2 sets - 2 reps - 30 hold - Supine Isometric Shoulder Extension with Towel  - 1 x daily - 7 x weekly - 2 sets - 10 reps - 5 hold - Supine Isometric Shoulder Adduction  with Towel Roll  - 1 x daily - 7 x weekly - 2 sets - 10 reps - 5 hold  - table slides! 8/13   ASSESSMENT:  CLINICAL IMPRESSION: Patient's pain increased to 5/10 with activity this session. Distraction and TDN decreased pain back towards baseline but was slightly elevated due to contraction. Frequent cueing required during isometric contractions for controlled holds.  Patient will benefit from skilled physical therapy to reduce pain, improve ROM, and improve strength to return to PLOF.   OBJECTIVE  IMPAIRMENTS: cardiopulmonary status limiting activity, decreased activity tolerance, decreased mobility, increased fascial restrictions, impaired perceived functional ability, increased muscle spasms, impaired flexibility, impaired UE functional use, improper body mechanics, postural dysfunction, and pain.   ACTIVITY LIMITATIONS: carrying, lifting, bed mobility, bathing, dressing, self feeding, reach over head, and caring for others  PARTICIPATION LIMITATIONS: meal prep, cleaning, laundry, personal finances, interpersonal relationship, driving, shopping, community activity, yard work, church, and caregiving for husband  PERSONAL FACTORS: Age, Fitness, Past/current experiences, Time since onset of injury/illness/exacerbation, and 3+ comorbidities: aorta type A dissection in sept 2011 with aortic valve replacement bypass of R coronary artery, AAA, anemia, anxiety, CAD, CHF, depression, GERD, Heart failure, HLD, HTN, IBS, Kidney problems, migraine, osteoarthritis, osteoporosis, RA, ocular migraines, back pain  are also affecting patient's functional outcome.   REHAB POTENTIAL: Fair    CLINICAL DECISION MAKING: Evolving/moderate complexity  EVALUATION COMPLEXITY: Moderate   GOALS: Goals reviewed with patient? Yes  SHORT TERM GOALS: Target date: 08/09/2023   Patient will be independent in home exercise program to improve strength/mobility for better functional independence with ADLs. Baseline:7/23: HEP given  Goal status: INITIAL    LONG TERM GOALS: Target date: 09/05/2023   Patient will increase FOTO score to equal to or greater than  57%   to demonstrate statistically significant improvement in mobility and quality of life.  Baseline: 7/22: 46 Goal status: INITIAL  2.  Patient will improve shoulder AROM to > 140 degrees of flexion, scaption, and abduction for improved ability to perform overhead activities. Baseline: 7/22: see above Goal status: INITIAL  3.  Patient will demonstrate  adequate shoulder ROM and strength to be able to shave and dress independently with pain less than 3/10. Baseline: 7/23: 6/10 pain with dressing, limited ROM, unable to test strength  Goal status: INITIAL  4.  Patient will decrease Quick DASH score by > 8 points demonstrating reduced self-reported upper extremity disability. Baseline: 7/23: 50% Goal status: INITIAL  5.  Patient will be able to eat an entire meal without resting arm demonstrating improved functional strength Baseline: 7/23: has to rest while eating due to pain.  Goal status: INITIAL   PLAN:  PT FREQUENCY: 1x/week  PT DURATION: 8 weeks  PLANNED INTERVENTIONS: Therapeutic exercises, Therapeutic activity, Neuromuscular re-education, Balance training, Gait training, Patient/Family education, Self Care, Joint mobilization, Vestibular training, Canalith repositioning, Visual/preceptual remediation/compensation, Dry Needling, Electrical stimulation, Spinal mobilization, Cryotherapy, Moist heat, Compression bandaging, scar mobilization, Splintting, Taping, Traction, Ultrasound, Biofeedback, Ionotophoresis 4mg /ml Dexamethasone, Manual therapy, and Re-evaluation  PLAN FOR NEXT SESSION:   Continue with LUE strengthening ,progress as tolerated.    Precious Bard, PT 08/09/2023, 2:51 PM   2:51 PM, 08/09/23  Physical Therapist - [facility]  Outpatient Physical Therapy- Main Campus [phone]

## 2023-08-08 NOTE — Progress Notes (Signed)
[facility] Counselor/Therapist Progress Note  Patient ID: Rachel Torres, MRN: [id number],    Date: 08/08/2023  Time Spent: 10:00am-10:50am   50 minutes   Treatment Type: Individual Therapy  Reported Symptoms: stress  Mental Status Exam: Appearance:  Casual     Behavior: Appropriate  Motor: Normal  Speech/Language:  Normal Rate  Affect: Appropriate  Mood: normal  Thought process: normal  Thought content:   WNL  Sensory/Perceptual disturbances:   WNL  Orientation: oriented to person, place, time/date, and situation  Attention: Good  Concentration: Good  Memory: WNL  Fund of knowledge:  Good  Insight:   Good  Judgment:  Good  Impulse Control: Good   Risk Assessment: Danger to Self:  No Self-injurious Behavior: No Danger to Others: No Duty to Warn:no Physical Aggression / Violence:No  Access to Firearms a concern: No  Gang Involvement:No   Subjective: Pt present for face-to-face individual therapy via video.  Pt consents to telehealth video session and is aware of limitations of video sessions. Location of pt: home Location of therapist: home office.    Pt talked about the stress of caregiving for [name].  Pt and [name] will celebrate their wedding anniversary this week.   Pt wants to do something special while [name] can still remember the moments.  Pt has been denied respite from the [place].   She was tearful as she talked about needing a break.   Addressed pt's stress level.  Pt is worrying a lot. Worked on Pharmacologist.    Pt talked about her health.  She has been tired and continues to have pain issues.   Worked on self care strategies.  Provided supportive therapy.  Interventions: Cognitive Behavioral Therapy and Insight-Oriented  Diagnosis:  F33.1  Plan of Care: Recommend ongoing therapy.   Pt participated in setting treatment goals.   Pt needs a place to talk about stressors.  Plan to meet every two weeks.    Treatment Plan (target date:  03/23/2024) Client Abilities/Strengths  Pt is bright, engaging, and motivated for therapy.   Client Treatment Preferences  Individual therapy.  Client Statement of Needs  Improve coping skills.  Have a place to talk about stressors.   Symptoms  Depressed or irritable mood. Unresolved grief issues.  Problems Addressed  Unipolar Depression Goals 1. Alleviate depressive symptoms and return to previous level of effective functioning. 2. Appropriately grieve the loss in order to normalize mood and to return to previously adaptive level of functioning. Objective Learn and implement behavioral strategies to overcome depression. Target Date: 2024-03-23 Frequency: Biweekly  Progress: 40 Modality: individual  Related Interventions Engage the client in "behavioral activation," increasing his/her activity level and contact with sources of reward, while identifying processes that inhibit activation.  Use behavioral techniques such as instruction, rehearsal, role-playing, role reversal, as needed, to facilitate activity in the client's daily life; reinforce success. Assist the client in developing skills that increase the likelihood of deriving pleasure from behavioral activation (e.g., assertiveness skills, developing an exercise plan, less internal/more external focus, increased social involvement); reinforce success. Objective Identify important people in life, past and present, and describe the quality, good and poor, of those relationships. Target Date: 2024-03-23 Frequency: Biweekly  Progress: 40 Modality: individual  Related Interventions Conduct Interpersonal Therapy beginning with the assessment of the client's "interpersonal inventory" of important past and present relationships; develop a case formulation linking depression to grief, interpersonal role disputes, role transitions, and/or interpersonal deficits). Objective Learn and implement problem-solving and decision-making skills.  Target  Date: 2024-03-23 Frequency: Biweekly  Progress: 40 Modality: individual  Related Interventions Conduct Problem-Solving Therapy using techniques such as psychoeducation, modeling, and role-playing to teach client problem-solving skills (i.e., defining a problem specifically, generating possible solutions, evaluating the pros and cons of each solution, selecting and implementing a plan of action, evaluating the efficacy of the plan, accepting or revising the plan); role-play application of the problem-solving skill to a real life issue. Encourage in the client the development of a positive problem orientation in which problems and solving them are viewed as a natural part of life and not something to be feared, despaired, or avoided. 3. Develop healthy interpersonal relationships that lead to the alleviation and help prevent the relapse of depression. 4. Develop healthy thinking patterns and beliefs about self, others, and the world that lead to the alleviation and help prevent the relapse of depression. 5. Recognize, accept, and cope with feelings of depression. Diagnosis F33.1  Conditions For Discharge Achievement of treatment goals and objectives   Salomon Fick, LCSW

## 2023-08-09 ENCOUNTER — Ambulatory Visit: Payer: Medicare HMO

## 2023-08-09 DIAGNOSIS — M25612 Stiffness of left shoulder, not elsewhere classified: Secondary | ICD-10-CM

## 2023-08-09 DIAGNOSIS — M25512 Pain in left shoulder: Secondary | ICD-10-CM | POA: Diagnosis not present

## 2023-08-09 DIAGNOSIS — R293 Abnormal posture: Secondary | ICD-10-CM

## 2023-08-09 DIAGNOSIS — M6281 Muscle weakness (generalized): Secondary | ICD-10-CM

## 2023-08-11 NOTE — Therapy (Signed)
OUTPATIENT PHYSICAL THERAPY TREATMENT   Patient Name: Rachel Torres MRN: [id number] DOB:[birth year], 67 y.o., female Today's Date: 08/15/2023  END OF SESSION:  PT End of Session - 08/15/23 1144     Visit Number 4    Number of Visits 8    Date for PT Re-Evaluation 09/05/23    Authorization Type [employer]  Medicare HMO    Authorization Time Period 07/11/23-09/05/23    Progress Note Due on Visit 10    PT Start Time 1145    PT Stop Time 1229    PT Time Calculation (min) 44 min    Activity Tolerance Patient tolerated treatment well    Behavior During Therapy WFL for tasks assessed/performed               Past Medical History:  Diagnosis Date   AAA (abdominal aortic aneurysm) (HCC)    a. Chronic w/o evidence of aneurysmal dil on CTA 01/2016.   Alcohol abuse    Allergy    Anemia    Anxiety    Asthma    CAD (coronary artery disease)    a. 08/2010 s/p CABG x 1 (VG->RCA) @ time of Ao dissection repair; b. 01/2016 Lexiscan MV: mid antsept/apical defect w/ ? peri-infarct ischemia-->likely attenuation-->Med Rx.   Chewing difficulty    Chronic combined systolic (congestive) and diastolic (congestive) heart failure (HCC)    a. 2012 EF 30-35%; b. 04/2015 EF 40-45%; c. 01/2016 Echo: Ef 55-65%, nl AoV bioprosthesis, nl RV, nl PASP.   Constipation    Depression    Edema of both lower extremities    Gallbladder sludge    GERD (gastroesophageal reflux disease)    Heart failure (HCC)    Heart valve problem    History of Bicuspid Aortic Valve    a. 08/2010 s/p AVR @ time of Ao dissection repair; b. 01/2016 Echo: Ef 55-65%, nl AoV bioprosthesis, nl RV, nl PASP.   Hx of repair of dissecting thoracic aortic aneurysm, Stanford type A    a. 08/2010 s/p repair with AVR and VG->RCA; b. 01/2016 CTA: stable appearance of Asc Thoracic Aortic graft. Opacification of flase lumen of chronic abd Ao dissection w/ retrograde flow through lumbar arteries. No aneurysmal dil of flase lumen.   Hyperlipidemia     Hypertension    Hypertensive heart disease    IBS (irritable bowel syndrome)    Internal hemorrhoids    Kidney problem    Marfan syndrome    pt denies   Migraine    Obstructive sleep apnea    Osteoarthritis    Osteoporosis    Palpitations    Pneumonia    RA (rheumatoid arthritis) (HCC)    a. Followed by Dr. Mallie Mussel   Past Surgical History:  Procedure Laterality Date   ABDOMINAL AORTIC ANEURYSM REPAIR     CARDIAC CATHETERIZATION  2001   CESAREAN SECTION  1986 & 1991   CHOLECYSTECTOMY     COLONOSCOPY     ESOPHAGOGASTRODUODENOSCOPY (EGD) WITH PROPOFOL N/A 05/08/2020   Procedure: ESOPHAGOGASTRODUODENOSCOPY (EGD) WITH PROPOFOL;  Surgeon: Pasty Spillers, MD;  Location: [facility] ENDOSCOPY;  Service: Endoscopy;  Laterality: N/A;   FRACTURE SURGERY Right    shoulder replacement   HEMORRHOID BANDING     ORIF DISTAL RADIUS FRACTURE Left    UPPER GASTROINTESTINAL ENDOSCOPY     Patient Active Problem List   Diagnosis Date Noted   Multiple closed tarsal fractures of left foot, sequela 07/12/2023   Arm contusion, right, sequela 07/12/2023   Lumbar  disc disease 07/12/2023   Atrial fibrillation (HCC) 06/09/2023   Paresthesia 06/03/2023   BMI 34.0-34.9,adult 02/17/2023   Obesity, Beginning BMI 35.45 02/17/2023   Tinnitus 01/14/2023   Change in vision 01/14/2023   Colon cancer screening 01/14/2023   Personal history of fall 01/14/2023   Problem related to unspecified psychosocial circumstances 12/28/2022   Person encountering health services to consult on behalf of another person 12/28/2022   Palpitation 12/10/2022   Fatigue 10/26/2022   B12 deficiency 10/26/2022   Right shoulder pain 09/15/2022   Depression 09/14/2022   Class 2 severe obesity with serious comorbidity and body mass index (BMI) of 35.0 to 35.9 in adult [facility]) 09/14/2022   Other constipation 05/12/2022   Medicare annual wellness visit, initial 07/17/2021   TMJ (dislocation of temporomandibular joint) 07/17/2021    Encounter for screening mammogram for breast cancer 06/05/2021   Diarrhea 04/11/2020   Epigastric pain 02/19/2020   Dark stools 02/19/2020   History of atrial fibrillation 02/05/2019   Shortness of breath 01/27/2019   Irregular surface of cornea 12/25/2018   HPV (human papilloma virus) infection 12/01/2016   Screening mammogram, encounter for 11/29/2016   Estrogen deficiency 11/29/2016   Encounter for routine gynecological examination 11/29/2016   Grade III hemorrhoids 10/06/2016   Tachycardia 08/14/2016   Hemorrhoids 08/13/2016   Atypical migraine 08/03/2016   CAD (coronary artery disease)    AAA (abdominal aortic aneurysm) (HCC)    Chronic combined systolic (congestive) and diastolic (congestive) heart failure (HCC)    Prediabetes 02/11/2016   Generalized anxiety disorder 12/08/2015   Panic attacks 07/28/2015   Chest pain 07/09/2013   Sensorineural hearing loss 03/27/2013   Vitamin D deficiency 07/03/2012   H/O aortic dissection 05/12/2012   Routine general medical examination at a health care facility 09/20/2011   Atypical chest pain 09/15/2011   S/P AVR (aortic valve replacement) 01/07/2011   CORONARY ARTERY BYPASS GRAFT, HX OF 01/07/2011   Arthritis 12/20/2010   Back pain 12/15/2010   H/O dissecting abdominal aortic aneurysm repair 08/24/2010   IRRITABLE BOWEL SYNDROME 09/09/2009   Obstructive sleep apnea 08/04/2009   External hemorrhoid 06/26/2009   Osteoporosis 01/09/2009   Hyperlipidemia 07/26/2007   Depression with anxiety 07/26/2007   Essential hypertension 07/26/2007   Allergic rhinitis 07/26/2007   Asthma 07/26/2007   GERD 07/26/2007   Osteoarthritis 07/26/2007   Ocular migraine 07/26/2007   Mild intermittent asthma without complication 07/26/2007    PCP: [name] MD   REFERRING PROVIDER: Stormy Card  REFERRING DIAG: L rotator cuff   THERAPY DIAG:  Left shoulder pain, unspecified chronicity  Stiffness of left shoulder, not elsewhere  classified  Abnormal posture  Muscle weakness (generalized)  Rationale for Evaluation and Treatment: Rehabilitation  ONSET DATE: Feb 2024  SUBJECTIVE:  SUBJECTIVE STATEMENT: Patient sees physician Thursday for follow up on shoulders. Patient was away for the weekend.   Hand dominance: Left  PERTINENT HISTORY: Carlasia Norenberg is a 66yoF familiar to our services. Pt referred to PT for strengthening of chronic left shoulder DJD, known remote RC injury. Recently fell and broke four metatarsals, since cleared from use of CAM rocker. She was treated successfully in past for right shoulder pain and has past medical history of Right shoulder surgery replacement. 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 PMH aorta type A dissection in sept 2011 s/p aortic valve replacement, bypass of R coronary artery, anemia, anxiety, CAD, CHF, depression, GERD, Heart failure, HLD, HTN, IBS, migraine, osteoarthritis, osteoporosis, RA, back pain.     PAIN:  Are you having pain? No  PRECAUTIONS: None  FALLS:  Has patient fallen in last 6 months? Yes. Number of falls 2  PATIENT GOALS:to lift more than 3lb with LUE, be able to eat a meal without arm being tired.    OBJECTIVE:    TODAY'S TREATMENT:                                                                                                                                         DATE: 08/15/23  Supine:  Isometric contraction pressing into PT hand; 10x 5 seconds in the following positions:cues for 50% contraction; each UE -flexion, abduction, extension, adduction Scapular punches modified 10x RTB: biceps 10x each side, IR 10x each side, ER 10x each side  Modified wall cat cow 10x Wall posture 10x 5 second holds Wall circles with ball 10x each direction  Pectoral  stretch 3x30 seconds  Seated:  RTB row 15x RTB Scaption ER 10x    PATIENT EDUCATION: Education details: goals, POC  Person educated: Patient Education method: Explanation, Demonstration, Tactile cues, Verbal cues, and Handouts Education comprehension: verbalized understanding, returned demonstration, verbal cues required, and tactile cues required  HOME EXERCISE PROGRAM: Access Code: [email or web address] URL: [email or web address] Date: 07/11/2023 Prepared by: Precious Bard  Exercises - Seated Scapular Retraction  - 1 x daily - 7 x weekly - 2 sets - 10 reps - 5 hold - Neck Sidebending  - 1 x daily - 7 x weekly - 2 sets - 2 reps - 30 hold - Supine Isometric Shoulder Extension with Towel  - 1 x daily - 7 x weekly - 2 sets - 10 reps - 5 hold - Supine Isometric Shoulder Adduction with Towel Roll  - 1 x daily - 7 x weekly - 2 sets - 10 reps - 5 hold  - table slides! 8/13   ASSESSMENT:  CLINICAL IMPRESSION: Scapular stabilization and strengthening of rotator cuffs tolerated in pain free range of motion. Posture is primary focus throughout session. She requires intermittent cueing for scapular depression.   Patient will benefit from skilled physical therapy to reduce pain, improve ROM, and improve strength to return to  PLOF.   OBJECTIVE IMPAIRMENTS: cardiopulmonary status limiting activity, decreased activity tolerance, decreased mobility, increased fascial restrictions, impaired perceived functional ability, increased muscle spasms, impaired flexibility, impaired UE functional use, improper body mechanics, postural dysfunction, and pain.   ACTIVITY LIMITATIONS: carrying, lifting, bed mobility, bathing, dressing, self feeding, reach over head, and caring for others  PARTICIPATION LIMITATIONS: meal prep, cleaning, laundry, personal finances, interpersonal relationship, driving, shopping, community activity, yard work, church, and caregiving for husband  PERSONAL FACTORS: Age, Fitness,  Past/current experiences, Time since onset of injury/illness/exacerbation, and 3+ comorbidities: aorta type A dissection in sept 2011 with aortic valve replacement bypass of R coronary artery, AAA, anemia, anxiety, CAD, CHF, depression, GERD, Heart failure, HLD, HTN, IBS, Kidney problems, migraine, osteoarthritis, osteoporosis, RA, ocular migraines, back pain  are also affecting patient's functional outcome.   REHAB POTENTIAL: Fair    CLINICAL DECISION MAKING: Evolving/moderate complexity  EVALUATION COMPLEXITY: Moderate   GOALS: Goals reviewed with patient? Yes  SHORT TERM GOALS: Target date: 08/09/2023   Patient will be independent in home exercise program to improve strength/mobility for better functional independence with ADLs. Baseline:7/23: HEP given  Goal status: INITIAL    LONG TERM GOALS: Target date: 09/05/2023   Patient will increase FOTO score to equal to or greater than  57%   to demonstrate statistically significant improvement in mobility and quality of life.  Baseline: 7/22: 46 Goal status: INITIAL  2.  Patient will improve shoulder AROM to > 140 degrees of flexion, scaption, and abduction for improved ability to perform overhead activities. Baseline: 7/22: see above Goal status: INITIAL  3.  Patient will demonstrate adequate shoulder ROM and strength to be able to shave and dress independently with pain less than 3/10. Baseline: 7/23: 6/10 pain with dressing, limited ROM, unable to test strength  Goal status: INITIAL  4.  Patient will decrease Quick DASH score by > 8 points demonstrating reduced self-reported upper extremity disability. Baseline: 7/23: 50% Goal status: INITIAL  5.  Patient will be able to eat an entire meal without resting arm demonstrating improved functional strength Baseline: 7/23: has to rest while eating due to pain.  Goal status: INITIAL   PLAN:  PT FREQUENCY: 1x/week  PT DURATION: 8 weeks  PLANNED INTERVENTIONS: Therapeutic  exercises, Therapeutic activity, Neuromuscular re-education, Balance training, Gait training, Patient/Family education, Self Care, Joint mobilization, Vestibular training, Canalith repositioning, Visual/preceptual remediation/compensation, Dry Needling, Electrical stimulation, Spinal mobilization, Cryotherapy, Moist heat, Compression bandaging, scar mobilization, Splintting, Taping, Traction, Ultrasound, Biofeedback, Ionotophoresis 4mg /ml Dexamethasone, Manual therapy, and Re-evaluation  PLAN FOR NEXT SESSION:   Continue with LUE strengthening ,progress as tolerated.    Precious Bard, PT 08/15/2023, 1:09 PM   1:09 PM, 08/15/23  Physical Therapist - [facility]  Outpatient Physical Therapy- Main Campus [phone]

## 2023-08-15 ENCOUNTER — Ambulatory Visit: Payer: Medicare HMO

## 2023-08-15 DIAGNOSIS — M25512 Pain in left shoulder: Secondary | ICD-10-CM

## 2023-08-15 DIAGNOSIS — R293 Abnormal posture: Secondary | ICD-10-CM | POA: Diagnosis not present

## 2023-08-15 DIAGNOSIS — M6281 Muscle weakness (generalized): Secondary | ICD-10-CM | POA: Diagnosis not present

## 2023-08-15 DIAGNOSIS — M25612 Stiffness of left shoulder, not elsewhere classified: Secondary | ICD-10-CM

## 2023-08-16 ENCOUNTER — Ambulatory Visit (INDEPENDENT_AMBULATORY_CARE_PROVIDER_SITE_OTHER): Payer: Medicare HMO | Admitting: Psychology

## 2023-08-16 DIAGNOSIS — F331 Major depressive disorder, recurrent, moderate: Secondary | ICD-10-CM | POA: Diagnosis not present

## 2023-08-16 NOTE — Progress Notes (Signed)
[facility] Counselor/Therapist Progress Note  Patient ID: Rachel Torres, MRN: [id number],    Date: 08/16/2023  Time Spent: 10:00am-10:50am   50 minutes   Treatment Type: Individual Therapy  Reported Symptoms: stress  Mental Status Exam: Appearance:  Casual     Behavior: Appropriate  Motor: Normal  Speech/Language:  Normal Rate  Affect: Appropriate  Mood: normal  Thought process: normal  Thought content:   WNL  Sensory/Perceptual disturbances:   WNL  Orientation: oriented to person, place, time/date, and situation  Attention: Good  Concentration: Good  Memory: WNL  Fund of knowledge:  Good  Insight:   Good  Judgment:  Good  Impulse Control: Good   Risk Assessment: Danger to Self:  No Self-injurious Behavior: No Danger to Others: No Duty to Warn:no Physical Aggression / Violence:No  Access to Firearms a concern: No  Gang Involvement:No   Subjective: Pt present for face-to-face individual therapy via video.  Pt consents to telehealth video session and is aware of limitations and benefits of video sessions. Location of pt: home Location of therapist: home office.    Pt talked about caregiving for [name].  Pt and [name] celebrated their 10 year anniversary and had a nice weekend together.   Pt also made sure [name] could go to church Sunday which meant a lot to them.  [name] will be getting cataract surgery the next couple of months.  Addressed pt's stress level.  Pt is worrying a lot, especially about [name].  Worked on Pharmacologist.    Pt talked about her health.  She has been tired and continues to have pain issues.  Pt has several doctors appointments this week.  Worked on self care strategies.  Provided supportive therapy.  Interventions: Cognitive Behavioral Therapy and Insight-Oriented  Diagnosis:  F33.1  Plan of Care: Recommend ongoing therapy.   Pt participated in setting treatment goals.   Pt needs a place to talk about  stressors.  Plan to meet every two weeks.    Treatment Plan (target date: 03/23/2024) Client Abilities/Strengths  Pt is bright, engaging, and motivated for therapy.   Client Treatment Preferences  Individual therapy.  Client Statement of Needs  Improve coping skills.  Have a place to talk about stressors.   Symptoms  Depressed or irritable mood. Unresolved grief issues.  Problems Addressed  Unipolar Depression Goals 1. Alleviate depressive symptoms and return to previous level of effective functioning. 2. Appropriately grieve the loss in order to normalize mood and to return to previously adaptive level of functioning. Objective Learn and implement behavioral strategies to overcome depression. Target Date: 2024-03-23 Frequency: Biweekly  Progress: 40 Modality: individual  Related Interventions Engage the client in "behavioral activation," increasing his/her activity level and contact with sources of reward, while identifying processes that inhibit activation.  Use behavioral techniques such as instruction, rehearsal, role-playing, role reversal, as needed, to facilitate activity in the client's daily life; reinforce success. Assist the client in developing skills that increase the likelihood of deriving pleasure from behavioral activation (e.g., assertiveness skills, developing an exercise plan, less internal/more external focus, increased social involvement); reinforce success. Objective Identify important people in life, past and present, and describe the quality, good and poor, of those relationships. Target Date: 2024-03-23 Frequency: Biweekly  Progress: 40 Modality: individual  Related Interventions Conduct Interpersonal Therapy beginning with the assessment of the client's "interpersonal inventory" of important past and present relationships; develop a case formulation linking depression to grief, interpersonal role disputes, role transitions, and/or interpersonal  deficits). Objective Learn and implement problem-solving and decision-making skills. Target Date: 2024-03-23 Frequency: Biweekly  Progress: 40 Modality: individual  Related Interventions Conduct Problem-Solving Therapy using techniques such as psychoeducation, modeling, and role-playing to teach client problem-solving skills (i.e., defining a problem specifically, generating possible solutions, evaluating the pros and cons of each solution, selecting and implementing a plan of action, evaluating the efficacy of the plan, accepting or revising the plan); role-play application of the problem-solving skill to a real life issue. Encourage in the client the development of a positive problem orientation in which problems and solving them are viewed as a natural part of life and not something to be feared, despaired, or avoided. 3. Develop healthy interpersonal relationships that lead to the alleviation and help prevent the relapse of depression. 4. Develop healthy thinking patterns and beliefs about self, others, and the world that lead to the alleviation and help prevent the relapse of depression. 5. Recognize, accept, and cope with feelings of depression. Diagnosis F33.1  Conditions For Discharge Achievement of treatment goals and objectives   Salomon Fick, LCSW

## 2023-08-17 ENCOUNTER — Ambulatory Visit (INDEPENDENT_AMBULATORY_CARE_PROVIDER_SITE_OTHER): Payer: Medicare HMO | Admitting: Family Medicine

## 2023-08-17 ENCOUNTER — Encounter (INDEPENDENT_AMBULATORY_CARE_PROVIDER_SITE_OTHER): Payer: Self-pay | Admitting: Family Medicine

## 2023-08-17 VITALS — BP 108/69 | HR 76 | Temp 97.7°F | Ht 65.0 in | Wt 207.0 lb

## 2023-08-17 DIAGNOSIS — E782 Mixed hyperlipidemia: Secondary | ICD-10-CM

## 2023-08-17 DIAGNOSIS — Z6834 Body mass index (BMI) 34.0-34.9, adult: Secondary | ICD-10-CM

## 2023-08-17 DIAGNOSIS — F3289 Other specified depressive episodes: Secondary | ICD-10-CM | POA: Diagnosis not present

## 2023-08-17 DIAGNOSIS — E669 Obesity, unspecified: Secondary | ICD-10-CM | POA: Diagnosis not present

## 2023-08-17 DIAGNOSIS — E559 Vitamin D deficiency, unspecified: Secondary | ICD-10-CM

## 2023-08-17 DIAGNOSIS — R7303 Prediabetes: Secondary | ICD-10-CM | POA: Diagnosis not present

## 2023-08-17 MED ORDER — TOPIRAMATE 50 MG PO TABS
50.0000 mg | ORAL_TABLET | Freq: Every evening | ORAL | 0 refills | Status: DC
Start: 2023-08-17 — End: 2023-09-15

## 2023-08-18 ENCOUNTER — Ambulatory Visit
Admission: RE | Admit: 2023-08-18 | Discharge: 2023-08-18 | Disposition: A | Discharge status: Home / Self Care | Payer: Medicare HMO | Source: Ambulatory Visit | Attending: Family Medicine | Admitting: Family Medicine

## 2023-08-18 ENCOUNTER — Ambulatory Visit: Payer: Medicare HMO

## 2023-08-18 DIAGNOSIS — S40011A Contusion of right shoulder, initial encounter: Secondary | ICD-10-CM | POA: Diagnosis not present

## 2023-08-18 DIAGNOSIS — S40012A Contusion of left shoulder, initial encounter: Secondary | ICD-10-CM | POA: Diagnosis not present

## 2023-08-18 DIAGNOSIS — M25511 Pain in right shoulder: Secondary | ICD-10-CM | POA: Diagnosis not present

## 2023-08-18 DIAGNOSIS — M79641 Pain in right hand: Secondary | ICD-10-CM | POA: Diagnosis not present

## 2023-08-18 DIAGNOSIS — M25512 Pain in left shoulder: Secondary | ICD-10-CM | POA: Diagnosis not present

## 2023-08-18 DIAGNOSIS — Z1231 Encounter for screening mammogram for malignant neoplasm of breast: Secondary | ICD-10-CM | POA: Insufficient documentation

## 2023-08-18 DIAGNOSIS — M79601 Pain in right arm: Secondary | ICD-10-CM | POA: Diagnosis not present

## 2023-08-18 DIAGNOSIS — E2839 Other primary ovarian failure: Secondary | ICD-10-CM | POA: Diagnosis not present

## 2023-08-18 DIAGNOSIS — M810 Age-related osteoporosis without current pathological fracture: Secondary | ICD-10-CM | POA: Diagnosis not present

## 2023-08-18 LAB — LIPID PANEL WITH LDL/HDL RATIO
Cholesterol, Total: 161 mg/dL (ref 100–199)
HDL: 59 mg/dL (ref >39)
LDL Chol Calc (NIH): 83 mg/dL (ref 0–99)
LDL/HDL Ratio: 1.4 ratio (ref 0.0–3.2)
Triglycerides: 108 mg/dL (ref 0–149)
VLDL Cholesterol Cal: 19 mg/dL (ref 5–40)

## 2023-08-18 LAB — CMP14+EGFR
ALT: 20 IU/L (ref 0–32)
AST: 23 IU/L (ref 0–40)
Albumin: 4.3 g/dL (ref 3.9–4.9)
Alkaline Phosphatase: 92 IU/L (ref 44–121)
BUN/Creatinine Ratio: 15 (ref 12–28)
BUN: 15 mg/dL (ref 8–27)
Bilirubin Total: 0.4 mg/dL (ref 0.0–1.2)
CO2: 22 mmol/L (ref 20–29)
Calcium: 9.1 mg/dL (ref 8.7–10.3)
Chloride: 105 mmol/L (ref 96–106)
Creatinine, Ser: 0.99 mg/dL (ref 0.57–1.00)
Globulin, Total: 2.9 g/dL (ref 1.5–4.5)
Glucose: 113 mg/dL — ABNORMAL HIGH (ref 70–99)
Potassium: 4.3 mmol/L (ref 3.5–5.2)
Sodium: 141 mmol/L (ref 134–144)
Total Protein: 7.2 g/dL (ref 6.0–8.5)
eGFR: 63 mL/min/{1.73_m2} (ref >59)

## 2023-08-18 LAB — INSULIN, RANDOM: INSULIN: 18.6 u[IU]/mL (ref 2.6–24.9)

## 2023-08-18 LAB — VITAMIN B12: Vitamin B-12: 617 pg/mL (ref 232–1245)

## 2023-08-18 LAB — HEMOGLOBIN A1C
Est. average glucose Bld gHb Est-mCnc: 120 mg/dL
Hgb A1c MFr Bld: 5.8 % — ABNORMAL HIGH (ref 4.8–5.6)

## 2023-08-18 LAB — VITAMIN D 25 HYDROXY (VIT D DEFICIENCY, FRACTURES): Vit D, 25-Hydroxy: 45 ng/mL (ref 30.0–100.0)

## 2023-08-18 LAB — MICROALBUMIN / CREATININE URINE RATIO
Creatinine, Urine: 51.6 mg/dL
Microalb/Creat Ratio: 24 mg/g{creat} (ref 0–29)
Microalbumin, Urine: 12.5 ug/mL

## 2023-08-18 NOTE — Progress Notes (Signed)
Chief Complaint:   OBESITY Rachel Torres is here to discuss her progress with her obesity treatment plan along with follow-up of her obesity related diagnoses. Rachel Torres is on following a lower carbohydrate, vegetable and lean protein rich diet plan and states she is following her eating plan approximately 50% of the time. Rachel Torres states she is doing 0 minutes 0 times per week.  Today's visit was #: 35 Starting weight: 213 lbs Starting date: 05/05/2020 Today's weight: 207 lbs Today's date: 08/17/2023 Total lbs lost to date: 6 Total lbs lost since last in-office visit: 0  Interim History: Patient has done some celebration eating for her anniversary. She is working on getting back on track with her low carbohydrate plan.   Subjective:   1. Prediabetes Patient is on a low carbohydrate diet, and she is due for labs.  She is not on metformin.  2. Mixed hyperlipidemia Patient is on Zetia and Lipitor, and she is working on her diet.  3. Vitamin D deficiency Patient is on vitamin D, and she is due for labs.  She has no signs of over replacement.  4. Emotional Eating Behavior Patient feels her Topamax has helped decrease emotional eating behavior.  No side effects were noted, and she requests a refill.  Assessment/Plan:   1. Prediabetes We will check labs today.  Patient will continue with her diet, and we will follow-up at her next visit.  - Vitamin B12 - CMP14+EGFR - Insulin, random - Hemoglobin A1c - Microalbumin / creatinine urine ratio  2. Mixed hyperlipidemia We will check labs today.  Patient will continue her medications, and we will follow-up at her next visit.  - Lipid Panel With LDL/HDL Ratio  3. Vitamin D deficiency We will check labs today, and we will follow-up at patient's next visit.  - VITAMIN D 25 Hydroxy (Vit-D Deficiency, Fractures)  4. Emotional Eating Behavior Patient will continue Topamax 50 mg nightly, and we will refill for 1 month.  - topiramate  (TOPAMAX) 50 MG tablet; Take 1 tablet (50 mg total) by mouth at bedtime.  Dispense: 30 tablet; Refill: 0  5. BMI 34.0-34.9,adult  6. Obesity, Beginning BMI 35.45 Rachel Torres is currently in the action stage of change. As such, her goal is to continue with weight loss efforts. She has agreed to following a lower carbohydrate, vegetable and lean protein rich diet plan.   Behavioral modification strategies: increasing lean protein intake.  Rachel Torres has agreed to follow-up with our clinic in 4 weeks. She was informed of the importance of frequent follow-up visits to maximize her success with intensive lifestyle modifications for her multiple health conditions.   Rachel Torres was informed we would discuss her lab results at her next visit unless there is a critical issue that needs to be addressed sooner. Rachel Torres agreed to keep her next visit at the agreed upon time to discuss these results.  Objective:   Blood pressure 108/69, pulse 76, temperature 97.7 F (36.5 C), height 5\' 5"  (1.651 m), weight 207 lb (93.9 kg), SpO2 96%. Body mass index is 34.45 kg/m.  Lab Results  Component Value Date   CREATININE 0.99 08/17/2023   BUN 15 08/17/2023   NA 141 08/17/2023   K 4.3 08/17/2023   CL 105 08/17/2023   CO2 22 08/17/2023   Lab Results  Component Value Date   ALT 20 08/17/2023   AST 23 08/17/2023   ALKPHOS 92 08/17/2023   BILITOT 0.4 08/17/2023   Lab Results  Component Value Date  HGBA1C 5.8 (H) 08/17/2023   HGBA1C 5.9 (H) 02/17/2023   HGBA1C 5.8 (H) 10/26/2022   HGBA1C 5.5 03/17/2022   HGBA1C 5.5 12/03/2021   Lab Results  Component Value Date   INSULIN WILL FOLLOW 08/17/2023   INSULIN 22.4 02/17/2023   INSULIN 11.7 10/26/2022   INSULIN 4.5 12/03/2021   INSULIN 17.3 02/26/2021   Lab Results  Component Value Date   TSH 1.280 10/26/2022   Lab Results  Component Value Date   CHOL 161 08/17/2023   HDL 59 08/17/2023   LDLCALC 83 08/17/2023   LDLDIRECT 189.4 10/13/2011   TRIG 108  08/17/2023   CHOLHDL 3 03/17/2022   Lab Results  Component Value Date   VD25OH 45.0 08/17/2023   VD25OH 36.4 02/17/2023   VD25OH 57.6 10/26/2022   Lab Results  Component Value Date   WBC 5.1 10/26/2022   HGB 13.6 10/26/2022   HCT 42.0 10/26/2022   MCV 98 (H) 10/26/2022   PLT 333 10/26/2022   Lab Results  Component Value Date   FERRITIN 105.0 12/11/2010   Attestation Statements:   Reviewed by clinician on day of visit: allergies, medications, problem list, medical history, surgical history, family history, social history, and previous encounter notes.   I, Burt Knack, am acting as transcriptionist for [name], MD.  I have reviewed the above documentation for accuracy and completeness, and I agree with the above. -  [name], MD

## 2023-08-19 DIAGNOSIS — S92352A Displaced fracture of fifth metatarsal bone, left foot, initial encounter for closed fracture: Secondary | ICD-10-CM | POA: Diagnosis not present

## 2023-08-24 ENCOUNTER — Ambulatory Visit: Payer: Medicare HMO | Attending: Orthopedic Surgery

## 2023-08-24 ENCOUNTER — Ambulatory Visit (INDEPENDENT_AMBULATORY_CARE_PROVIDER_SITE_OTHER): Payer: Medicare HMO | Admitting: Psychology

## 2023-08-24 DIAGNOSIS — F331 Major depressive disorder, recurrent, moderate: Secondary | ICD-10-CM

## 2023-08-24 DIAGNOSIS — M25512 Pain in left shoulder: Secondary | ICD-10-CM | POA: Diagnosis not present

## 2023-08-24 DIAGNOSIS — M25612 Stiffness of left shoulder, not elsewhere classified: Secondary | ICD-10-CM | POA: Diagnosis not present

## 2023-08-24 DIAGNOSIS — R293 Abnormal posture: Secondary | ICD-10-CM

## 2023-08-24 DIAGNOSIS — R2689 Other abnormalities of gait and mobility: Secondary | ICD-10-CM | POA: Diagnosis not present

## 2023-08-24 DIAGNOSIS — M6281 Muscle weakness (generalized): Secondary | ICD-10-CM | POA: Diagnosis not present

## 2023-08-24 DIAGNOSIS — M25511 Pain in right shoulder: Secondary | ICD-10-CM | POA: Insufficient documentation

## 2023-08-24 DIAGNOSIS — G8929 Other chronic pain: Secondary | ICD-10-CM | POA: Insufficient documentation

## 2023-08-24 DIAGNOSIS — M5459 Other low back pain: Secondary | ICD-10-CM

## 2023-08-24 NOTE — Progress Notes (Signed)
[facility] Counselor/Therapist Progress Note  Patient ID: Rachel Torres, MRN: [id number],    Date: 08/24/2023  Time Spent: 10:00am-10:55am   55 minutes   Treatment Type: Individual Therapy  Reported Symptoms: stress  Mental Status Exam: Appearance:  Casual     Behavior: Appropriate  Motor: Normal  Speech/Language:  Normal Rate  Affect: Appropriate  Mood: normal  Thought process: normal  Thought content:   WNL  Sensory/Perceptual disturbances:   WNL  Orientation: oriented to person, place, time/date, and situation  Attention: Good  Concentration: Good  Memory: WNL  Fund of knowledge:  Good  Insight:   Good  Judgment:  Good  Impulse Control: Good   Risk Assessment: Danger to Self:  No Self-injurious Behavior: No Danger to Others: No Duty to Warn:no Physical Aggression / Violence:No  Access to Firearms a concern: No  Gang Involvement:No   Subjective: Pt present for face-to-face individual therapy via video.  Pt consents to telehealth video session and is aware of limitations and benefits of video sessions. Location of pt: home Location of therapist: home office.    Pt talked about caregiving for [name].  Pt states [name] has been "pushing her buttons" and he is always up and down and fell again.   He gets determined to do things and doesn't always listen to pt and her advice regarding safety.   Addressed pt's stress level.  Pt is worrying a lot, especially about [name].  Worked on Pharmacologist.    Pt talked about her relationship with her mother.  [name] mother calls pt 5-6 times a day.  This gets frustrating for pt.  Helped pt process her feelings and family dynamics.  Pt talked about the loses of her childhood.  She lost her father , brother and grandfather before she was 73 years old.   Worked on self care strategies.  Provided supportive therapy.  Interventions: Cognitive Behavioral Therapy and Insight-Oriented  Diagnosis:   F33.1  Plan of Care: Recommend ongoing therapy.   Pt participated in setting treatment goals.   Pt needs a place to talk about stressors.  Plan to meet every two weeks.    Treatment Plan (target date: 03/23/2024) Client Abilities/Strengths  Pt is bright, engaging, and motivated for therapy.   Client Treatment Preferences  Individual therapy.  Client Statement of Needs  Improve coping skills.  Have a place to talk about stressors.   Symptoms  Depressed or irritable mood. Unresolved grief issues.  Problems Addressed  Unipolar Depression Goals 1. Alleviate depressive symptoms and return to previous level of effective functioning. 2. Appropriately grieve the loss in order to normalize mood and to return to previously adaptive level of functioning. Objective Learn and implement behavioral strategies to overcome depression. Target Date: 2024-03-23 Frequency: Biweekly  Progress: 40 Modality: individual  Related Interventions Engage the client in "behavioral activation," increasing his/her activity level and contact with sources of reward, while identifying processes that inhibit activation.  Use behavioral techniques such as instruction, rehearsal, role-playing, role reversal, as needed, to facilitate activity in the client's daily life; reinforce success. Assist the client in developing skills that increase the likelihood of deriving pleasure from behavioral activation (e.g., assertiveness skills, developing an exercise plan, less internal/more external focus, increased social involvement); reinforce success. Objective Identify important people in life, past and present, and describe the quality, good and poor, of those relationships. Target Date: 2024-03-23 Frequency: Biweekly  Progress: 40 Modality: individual  Related Interventions Conduct Interpersonal Therapy beginning with the  assessment of the client's "interpersonal inventory" of important past and present relationships; develop a  case formulation linking depression to grief, interpersonal role disputes, role transitions, and/or interpersonal deficits). Objective Learn and implement problem-solving and decision-making skills. Target Date: 2024-03-23 Frequency: Biweekly  Progress: 40 Modality: individual  Related Interventions Conduct Problem-Solving Therapy using techniques such as psychoeducation, modeling, and role-playing to teach client problem-solving skills (i.e., defining a problem specifically, generating possible solutions, evaluating the pros and cons of each solution, selecting and implementing a plan of action, evaluating the efficacy of the plan, accepting or revising the plan); role-play application of the problem-solving skill to a real life issue. Encourage in the client the development of a positive problem orientation in which problems and solving them are viewed as a natural part of life and not something to be feared, despaired, or avoided. 3. Develop healthy interpersonal relationships that lead to the alleviation and help prevent the relapse of depression. 4. Develop healthy thinking patterns and beliefs about self, others, and the world that lead to the alleviation and help prevent the relapse of depression. 5. Recognize, accept, and cope with feelings of depression. Diagnosis F33.1  Conditions For Discharge Achievement of treatment goals and objectives   Salomon Fick, LCSW

## 2023-08-24 NOTE — Therapy (Signed)
OUTPATIENT PHYSICAL THERAPY TREATMENT   Patient Name: Rachel Torres MRN: [id number] DOB:1956-09-03, 67 y.o., female Today's Date: 08/24/2023  END OF SESSION:  PT End of Session - 08/24/23 1150     Visit Number 5    Number of Visits 8    Date for PT Re-Evaluation 09/05/23    Authorization Type [employer]  Medicare HMO    Authorization Time Period 07/11/23-09/05/23    Progress Note Due on Visit 10    PT Start Time 1149    PT Stop Time 1230    PT Time Calculation (min) 41 min    Activity Tolerance Patient tolerated treatment well    Behavior During Therapy WFL for tasks assessed/performed              Past Medical History:  Diagnosis Date   AAA (abdominal aortic aneurysm) (HCC)    a. Chronic w/o evidence of aneurysmal dil on CTA 01/2016.   Alcohol abuse    Allergy    Anemia    Anxiety    Asthma    CAD (coronary artery disease)    a. 08/2010 s/p CABG x 1 (VG->RCA) @ time of Ao dissection repair; b. 01/2016 Lexiscan MV: mid antsept/apical defect w/ ? peri-infarct ischemia-->likely attenuation-->Med Rx.   Chewing difficulty    Chronic combined systolic (congestive) and diastolic (congestive) heart failure (HCC)    a. 2012 EF 30-35%; b. 04/2015 EF 40-45%; c. 01/2016 Echo: Ef 55-65%, nl AoV bioprosthesis, nl RV, nl PASP.   Constipation    Depression    Edema of both lower extremities    Gallbladder sludge    GERD (gastroesophageal reflux disease)    Heart failure (HCC)    Heart valve problem    History of Bicuspid Aortic Valve    a. 08/2010 s/p AVR @ time of Ao dissection repair; b. 01/2016 Echo: Ef 55-65%, nl AoV bioprosthesis, nl RV, nl PASP.   Hx of repair of dissecting thoracic aortic aneurysm, Stanford type A    a. 08/2010 s/p repair with AVR and VG->RCA; b. 01/2016 CTA: stable appearance of Asc Thoracic Aortic graft. Opacification of flase lumen of chronic abd Ao dissection w/ retrograde flow through lumbar arteries. No aneurysmal dil of flase lumen.   Hyperlipidemia     Hypertension    Hypertensive heart disease    IBS (irritable bowel syndrome)    Internal hemorrhoids    Kidney problem    Marfan syndrome    pt denies   Migraine    Obstructive sleep apnea    Osteoarthritis    Osteoporosis    Palpitations    Pneumonia    RA (rheumatoid arthritis) (HCC)    a. Followed by Dr. Mallie Mussel   Past Surgical History:  Procedure Laterality Date   ABDOMINAL AORTIC ANEURYSM REPAIR     CARDIAC CATHETERIZATION  2001   CESAREAN SECTION  1986 & 1991   CHOLECYSTECTOMY     COLONOSCOPY     ESOPHAGOGASTRODUODENOSCOPY (EGD) WITH PROPOFOL N/A 05/08/2020   Procedure: ESOPHAGOGASTRODUODENOSCOPY (EGD) WITH PROPOFOL;  Surgeon: Pasty Spillers, MD;  Location: [facility] ENDOSCOPY;  Service: Endoscopy;  Laterality: N/A;   FRACTURE SURGERY Right    shoulder replacement   HEMORRHOID BANDING     ORIF DISTAL RADIUS FRACTURE Left    UPPER GASTROINTESTINAL ENDOSCOPY     Patient Active Problem List   Diagnosis Date Noted   Multiple closed tarsal fractures of left foot, sequela 07/12/2023   Arm contusion, right, sequela 07/12/2023   Lumbar disc  disease 07/12/2023   Atrial fibrillation (HCC) 06/09/2023   Paresthesia 06/03/2023   BMI 34.0-34.9,adult 02/17/2023   Obesity, Beginning BMI 35.45 02/17/2023   Tinnitus 01/14/2023   Change in vision 01/14/2023   Colon cancer screening 01/14/2023   Personal history of fall 01/14/2023   Problem related to unspecified psychosocial circumstances 12/28/2022   Person encountering health services to consult on behalf of another person 12/28/2022   Palpitation 12/10/2022   Fatigue 10/26/2022   B12 deficiency 10/26/2022   Right shoulder pain 09/15/2022   Depression 09/14/2022   Class 2 severe obesity with serious comorbidity and body mass index (BMI) of 35.0 to 35.9 in adult [facility]) 09/14/2022   Other constipation 05/12/2022   Medicare annual wellness visit, initial 07/17/2021   TMJ (dislocation of temporomandibular joint) 07/17/2021    Encounter for screening mammogram for breast cancer 06/05/2021   Diarrhea 04/11/2020   Epigastric pain 02/19/2020   Dark stools 02/19/2020   History of atrial fibrillation 02/05/2019   Shortness of breath 01/27/2019   Irregular surface of cornea 12/25/2018   HPV (human papilloma virus) infection 12/01/2016   Screening mammogram, encounter for 11/29/2016   Estrogen deficiency 11/29/2016   Encounter for routine gynecological examination 11/29/2016   Grade III hemorrhoids 10/06/2016   Tachycardia 08/14/2016   Hemorrhoids 08/13/2016   Atypical migraine 08/03/2016   CAD (coronary artery disease)    AAA (abdominal aortic aneurysm) (HCC)    Chronic combined systolic (congestive) and diastolic (congestive) heart failure (HCC)    Prediabetes 02/11/2016   Generalized anxiety disorder 12/08/2015   Panic attacks 07/28/2015   Chest pain 07/09/2013   Sensorineural hearing loss 03/27/2013   Vitamin D deficiency 07/03/2012   H/O aortic dissection 05/12/2012   Routine general medical examination at a health care facility 09/20/2011   Atypical chest pain 09/15/2011   S/P AVR (aortic valve replacement) 01/07/2011   CORONARY ARTERY BYPASS GRAFT, HX OF 01/07/2011   Arthritis 12/20/2010   Back pain 12/15/2010   H/O dissecting abdominal aortic aneurysm repair 08/24/2010   IRRITABLE BOWEL SYNDROME 09/09/2009   Obstructive sleep apnea 08/04/2009   External hemorrhoid 06/26/2009   Osteoporosis 01/09/2009   Hyperlipidemia 07/26/2007   Depression with anxiety 07/26/2007   Essential hypertension 07/26/2007   Allergic rhinitis 07/26/2007   Asthma 07/26/2007   GERD 07/26/2007   Osteoarthritis 07/26/2007   Ocular migraine 07/26/2007   Mild intermittent asthma without complication 07/26/2007    PCP: [name] MD   REFERRING PROVIDER: Stormy Card  REFERRING DIAG: L rotator cuff   THERAPY DIAG:  Left shoulder pain, unspecified chronicity  Stiffness of left shoulder, not elsewhere  classified  Abnormal posture  Muscle weakness (generalized)  Other low back pain  Other abnormalities of gait and mobility  Rationale for Evaluation and Treatment: Rehabilitation  ONSET DATE: Feb 2024  SUBJECTIVE:  SUBJECTIVE STATEMENT:  Pt reports that the shoulder is weak, and isn't bad today, but is hurting in the lateral deltoid, most notably distally and middle.    Hand dominance: Left  PERTINENT HISTORY: Ayrionna Sholtis is a 66yoF familiar to our services. Pt referred to PT for strengthening of chronic left shoulder DJD, known remote RC injury. Recently fell and broke four metatarsals, since cleared from use of CAM rocker. She was treated successfully in past for right shoulder pain and has past medical history of Right shoulder surgery replacement. 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 PMH aorta type A dissection in sept 2011 s/p aortic valve replacement, bypass of R coronary artery, anemia, anxiety, CAD, CHF, depression, GERD, Heart failure, HLD, HTN, IBS, migraine, osteoarthritis, osteoporosis, RA, back pain.     PAIN:  Are you having pain? Yes, 3/10 pain in lateral deltoids in the shoulder.  PRECAUTIONS: None  FALLS:  Has patient fallen in last 6 months? Yes. Number of falls 2  PATIENT GOALS:to lift more than 3lb with LUE, be able to eat a meal without arm being tired.    OBJECTIVE:    TODAY'S TREATMENT: DATE: 08/24/23   TherEx:  Supine:   Rhythmic stabilization exercises, 30 sec bouts with therapist applied resistance started proximal and moving distally for increased difficulty, x2 each UE  Seated:  RTB row, 2x10 each UE RTB Scaption ER 2x10 each UE RTB resisted ER bilaterally, with elbows tucked by side, 2x10   Manual:  Seated STM to Infraspinatus, L UT, and  Lateral deltoid for TP release, with good results Supine STM to the UT's bilaterally for pain modulation  Trigger Point Dry-Needling  Treatment instructions: Expect mild to moderate muscle soreness. S/S of pneumothorax if dry needled over a lung field, and to seek immediate medical attention should they occur. Patient verbalized understanding of these instructions and education.  Patient Consent Given: Yes Education handout provided: Previously provided Muscles treated: B UT's, L deltoid Electrical stimulation performed: No Parameters: N/A Treatment response/outcome: Pt with significant muscular twitch in bilateral UT's.  Pt noted a reduction in overall in the UT's, and therapist noted an increase in tissue extensibility following the treatment as well.  No benefit found in the L middle deltoid region.        PATIENT EDUCATION: Education details: goals, POC  Person educated: Patient Education method: Explanation, Demonstration, Tactile cues, Verbal cues, and Handouts Education comprehension: verbalized understanding, returned demonstration, verbal cues required, and tactile cues required  HOME EXERCISE PROGRAM: Access Code: [email or web address] URL: [email or web address] Date: 07/11/2023 Prepared by: Precious Bard  Exercises - Seated Scapular Retraction  - 1 x daily - 7 x weekly - 2 sets - 10 reps - 5 hold - Neck Sidebending  - 1 x daily - 7 x weekly - 2 sets - 2 reps - 30 hold - Supine Isometric Shoulder Extension with Towel  - 1 x daily - 7 x weekly - 2 sets - 10 reps - 5 hold - Supine Isometric Shoulder Adduction with Towel Roll  - 1 x daily - 7 x weekly - 2 sets - 10 reps - 5 hold  - table slides! 8/13   ASSESSMENT:  CLINICAL IMPRESSION: Pt responded favorably to the exercises and treatment approaches during the session today.  Pt noted a reduction in overall pain levels at the UT, and lateral deltoid of the L shoulder.  Pt does still demonstrate considerable weakness in  the shoulders bilaterally, specifically with ER.  Pt also noted to  have TP's in the distal deltoid of the L side, however unable to get noticeable twitch or relief with dry needling approach, noting manual therapy with TP release was the best method at this time.  Pt will continue to benefit from skilled therapy to address remaining deficits in order to improve overall QoL and return to PLOF.     OBJECTIVE IMPAIRMENTS: cardiopulmonary status limiting activity, decreased activity tolerance, decreased mobility, increased fascial restrictions, impaired perceived functional ability, increased muscle spasms, impaired flexibility, impaired UE functional use, improper body mechanics, postural dysfunction, and pain.   ACTIVITY LIMITATIONS: carrying, lifting, bed mobility, bathing, dressing, self feeding, reach over head, and caring for others  PARTICIPATION LIMITATIONS: meal prep, cleaning, laundry, personal finances, interpersonal relationship, driving, shopping, community activity, yard work, church, and caregiving for husband  PERSONAL FACTORS: Age, Fitness, Past/current experiences, Time since onset of injury/illness/exacerbation, and 3+ comorbidities: aorta type A dissection in sept 2011 with aortic valve replacement bypass of R coronary artery, AAA, anemia, anxiety, CAD, CHF, depression, GERD, Heart failure, HLD, HTN, IBS, Kidney problems, migraine, osteoarthritis, osteoporosis, RA, ocular migraines, back pain  are also affecting patient's functional outcome.   REHAB POTENTIAL: Fair    CLINICAL DECISION MAKING: Evolving/moderate complexity  EVALUATION COMPLEXITY: Moderate   GOALS: Goals reviewed with patient? Yes  SHORT TERM GOALS: Target date: 08/09/2023   Patient will be independent in home exercise program to improve strength/mobility for better functional independence with ADLs. Baseline:7/23: HEP given  Goal status: INITIAL    LONG TERM GOALS: Target date: 09/05/2023   Patient will  increase FOTO score to equal to or greater than  57%   to demonstrate statistically significant improvement in mobility and quality of life.  Baseline: 7/22: 46 Goal status: INITIAL  2.  Patient will improve shoulder AROM to > 140 degrees of flexion, scaption, and abduction for improved ability to perform overhead activities. Baseline: 7/22: see above Goal status: INITIAL  3.  Patient will demonstrate adequate shoulder ROM and strength to be able to shave and dress independently with pain less than 3/10. Baseline: 7/23: 6/10 pain with dressing, limited ROM, unable to test strength  Goal status: INITIAL  4.  Patient will decrease Quick DASH score by > 8 points demonstrating reduced self-reported upper extremity disability. Baseline: 7/23: 50% Goal status: INITIAL  5.  Patient will be able to eat an entire meal without resting arm demonstrating improved functional strength Baseline: 7/23: has to rest while eating due to pain.  Goal status: INITIAL   PLAN:  PT FREQUENCY: 1x/week  PT DURATION: 8 weeks  PLANNED INTERVENTIONS: Therapeutic exercises, Therapeutic activity, Neuromuscular re-education, Balance training, Gait training, Patient/Family education, Self Care, Joint mobilization, Vestibular training, Canalith repositioning, Visual/preceptual remediation/compensation, Dry Needling, Electrical stimulation, Spinal mobilization, Cryotherapy, Moist heat, Compression bandaging, scar mobilization, Splintting, Taping, Traction, Ultrasound, Biofeedback, Ionotophoresis 4mg /ml Dexamethasone, Manual therapy, and Re-evaluation  PLAN FOR NEXT SESSION:   Continue with LUE strengthening ,progress as tolerated.     Nolon Bussing, PT, DPT Physical Therapist - [facility]  08/24/23, 4:00 PM

## 2023-08-29 ENCOUNTER — Ambulatory Visit: Payer: Medicare HMO

## 2023-08-30 ENCOUNTER — Ambulatory Visit (INDEPENDENT_AMBULATORY_CARE_PROVIDER_SITE_OTHER): Payer: Medicare HMO | Admitting: Psychology

## 2023-08-30 DIAGNOSIS — F331 Major depressive disorder, recurrent, moderate: Secondary | ICD-10-CM | POA: Diagnosis not present

## 2023-08-30 NOTE — Progress Notes (Signed)
[facility] Counselor/Therapist Progress Note  Patient ID: Rachel Torres, MRN: [id number],    Date: 08/30/2023  Time Spent: 10:00am-10:55am   55 minutes   Treatment Type: Individual Therapy  Reported Symptoms: stress  Mental Status Exam: Appearance:  Casual     Behavior: Appropriate  Motor: Normal  Speech/Language:  Normal Rate  Affect: Appropriate  Mood: normal  Thought process: normal  Thought content:   WNL  Sensory/Perceptual disturbances:   WNL  Orientation: oriented to person, place, time/date, and situation  Attention: Good  Concentration: Good  Memory: WNL  Fund of knowledge:  Good  Insight:   Good  Judgment:  Good  Impulse Control: Good   Risk Assessment: Danger to Self:  No Self-injurious Behavior: No Danger to Others: No Duty to Warn:no Physical Aggression / Violence:No  Access to Firearms a concern: No  Gang Involvement:No   Subjective: Pt present for face-to-face individual therapy via video.  Pt consents to telehealth video session and is aware of limitations and benefits of video sessions. Location of pt: home Location of therapist: home office.    Pt talked about going to a caregiver summit for a day.   It was a good and helpful experience for her.  She was able to connect with additional caregiving respite resources.   Pt talked about her husband Rachel Torres.   Rachel Torres has not been a argumentative this past week.   Pt is considering letting a [place] female friend of theirs stay with them for awhile bc he needs a place to stay and he could be helpful with caregiving for Rachel Torres and home projects.   Addressed the pros and cons with having him move in with them.  Worked on Designer, multimedia.  Worked on self care strategies.  Provided supportive therapy.  Interventions: Cognitive Behavioral Therapy and Insight-Oriented  Diagnosis:  F33.1  Plan of Care: Recommend ongoing therapy.   Pt participated in setting treatment goals.   Pt needs a place to  talk about stressors.  Plan to meet every two weeks.    Treatment Plan (target date: 03/23/2024) Client Abilities/Strengths  Pt is bright, engaging, and motivated for therapy.   Client Treatment Preferences  Individual therapy.  Client Statement of Needs  Improve coping skills.  Have a place to talk about stressors.   Symptoms  Depressed or irritable mood. Unresolved grief issues.  Problems Addressed  Unipolar Depression Goals 1. Alleviate depressive symptoms and return to previous level of effective functioning. 2. Appropriately grieve the loss in order to normalize mood and to return to previously adaptive level of functioning. Objective Learn and implement behavioral strategies to overcome depression. Target Date: 2024-03-23 Frequency: Biweekly  Progress: 40 Modality: individual  Related Interventions Engage the client in "behavioral activation," increasing his/her activity level and contact with sources of reward, while identifying processes that inhibit activation.  Use behavioral techniques such as instruction, rehearsal, role-playing, role reversal, as needed, to facilitate activity in the client's daily life; reinforce success. Assist the client in developing skills that increase the likelihood of deriving pleasure from behavioral activation (e.g., assertiveness skills, developing an exercise plan, less internal/more external focus, increased social involvement); reinforce success. Objective Identify important people in life, past and present, and describe the quality, good and poor, of those relationships. Target Date: 2024-03-23 Frequency: Biweekly  Progress: 40 Modality: individual  Related Interventions Conduct Interpersonal Therapy beginning with the assessment of the client's "interpersonal inventory" of important past and present relationships; develop a case formulation linking depression to  grief, interpersonal role disputes, role transitions, and/or interpersonal  deficits). Objective Learn and implement problem-solving and decision-making skills. Target Date: 2024-03-23 Frequency: Biweekly  Progress: 40 Modality: individual  Related Interventions Conduct Problem-Solving Therapy using techniques such as psychoeducation, modeling, and role-playing to teach client problem-solving skills (i.e., defining a problem specifically, generating possible solutions, evaluating the pros and cons of each solution, selecting and implementing a plan of action, evaluating the efficacy of the plan, accepting or revising the plan); role-play application of the problem-solving skill to a real life issue. Encourage in the client the development of a positive problem orientation in which problems and solving them are viewed as a natural part of life and not something to be feared, despaired, or avoided. 3. Develop healthy interpersonal relationships that lead to the alleviation and help prevent the relapse of depression. 4. Develop healthy thinking patterns and beliefs about self, others, and the world that lead to the alleviation and help prevent the relapse of depression. 5. Recognize, accept, and cope with feelings of depression. Diagnosis F33.1  Conditions For Discharge Achievement of treatment goals and objectives   Salomon Fick, LCSW

## 2023-08-31 NOTE — Therapy (Signed)
OUTPATIENT PHYSICAL THERAPY TREATMENT   Patient Name: Rachel Torres MRN: [id number] DOB:12/05/1956, 67 y.o., female Today's Date: 09/01/2023  END OF SESSION:  PT End of Session - 09/01/23 1257     Visit Number 6    Number of Visits 8    Date for PT Re-Evaluation 09/05/23    Authorization Type [employer]  Medicare HMO    Authorization Time Period 07/11/23-09/05/23    Progress Note Due on Visit 10    PT Start Time 1315    PT Stop Time 1357    PT Time Calculation (min) 42 min    Activity Tolerance Patient tolerated treatment well    Behavior During Therapy WFL for tasks assessed/performed               Past Medical History:  Diagnosis Date   AAA (abdominal aortic aneurysm) (HCC)    a. Chronic w/o evidence of aneurysmal dil on CTA 01/2016.   Alcohol abuse    Allergy    Anemia    Anxiety    Asthma    CAD (coronary artery disease)    a. 08/2010 s/p CABG x 1 (VG->RCA) @ time of Ao dissection repair; b. 01/2016 Lexiscan MV: mid antsept/apical defect w/ ? peri-infarct ischemia-->likely attenuation-->Med Rx.   Chewing difficulty    Chronic combined systolic (congestive) and diastolic (congestive) heart failure (HCC)    a. 2012 EF 30-35%; b. 04/2015 EF 40-45%; c. 01/2016 Echo: Ef 55-65%, nl AoV bioprosthesis, nl RV, nl PASP.   Constipation    Depression    Edema of both lower extremities    Gallbladder sludge    GERD (gastroesophageal reflux disease)    Heart failure (HCC)    Heart valve problem    History of Bicuspid Aortic Valve    a. 08/2010 s/p AVR @ time of Ao dissection repair; b. 01/2016 Echo: Ef 55-65%, nl AoV bioprosthesis, nl RV, nl PASP.   Hx of repair of dissecting thoracic aortic aneurysm, Stanford type A    a. 08/2010 s/p repair with AVR and VG->RCA; b. 01/2016 CTA: stable appearance of Asc Thoracic Aortic graft. Opacification of flase lumen of chronic abd Ao dissection w/ retrograde flow through lumbar arteries. No aneurysmal dil of flase lumen.   Hyperlipidemia     Hypertension    Hypertensive heart disease    IBS (irritable bowel syndrome)    Internal hemorrhoids    Kidney problem    Marfan syndrome    pt denies   Migraine    Obstructive sleep apnea    Osteoarthritis    Osteoporosis    Palpitations    Pneumonia    RA (rheumatoid arthritis) (HCC)    a. Followed by Dr. Mallie Mussel   Past Surgical History:  Procedure Laterality Date   ABDOMINAL AORTIC ANEURYSM REPAIR     CARDIAC CATHETERIZATION  2001   CESAREAN SECTION  1986 & 1991   CHOLECYSTECTOMY     COLONOSCOPY     ESOPHAGOGASTRODUODENOSCOPY (EGD) WITH PROPOFOL N/A 05/08/2020   Procedure: ESOPHAGOGASTRODUODENOSCOPY (EGD) WITH PROPOFOL;  Surgeon: Pasty Spillers, MD;  Location: [facility] ENDOSCOPY;  Service: Endoscopy;  Laterality: N/A;   FRACTURE SURGERY Right    shoulder replacement   HEMORRHOID BANDING     ORIF DISTAL RADIUS FRACTURE Left    UPPER GASTROINTESTINAL ENDOSCOPY     Patient Active Problem List   Diagnosis Date Noted   Multiple closed tarsal fractures of left foot, sequela 07/12/2023   Arm contusion, right, sequela 07/12/2023   Lumbar  disc disease 07/12/2023   Atrial fibrillation (HCC) 06/09/2023   Paresthesia 06/03/2023   BMI 34.0-34.9,adult 02/17/2023   Obesity, Beginning BMI 35.45 02/17/2023   Tinnitus 01/14/2023   Change in vision 01/14/2023   Colon cancer screening 01/14/2023   Personal history of fall 01/14/2023   Problem related to unspecified psychosocial circumstances 12/28/2022   Person encountering health services to consult on behalf of another person 12/28/2022   Palpitation 12/10/2022   Fatigue 10/26/2022   B12 deficiency 10/26/2022   Right shoulder pain 09/15/2022   Depression 09/14/2022   Class 2 severe obesity with serious comorbidity and body mass index (BMI) of 35.0 to 35.9 in adult [facility]) 09/14/2022   Other constipation 05/12/2022   Medicare annual wellness visit, initial 07/17/2021   TMJ (dislocation of temporomandibular joint) 07/17/2021    Encounter for screening mammogram for breast cancer 06/05/2021   Diarrhea 04/11/2020   Epigastric pain 02/19/2020   Dark stools 02/19/2020   History of atrial fibrillation 02/05/2019   Shortness of breath 01/27/2019   Irregular surface of cornea 12/25/2018   HPV (human papilloma virus) infection 12/01/2016   Screening mammogram, encounter for 11/29/2016   Estrogen deficiency 11/29/2016   Encounter for routine gynecological examination 11/29/2016   Grade III hemorrhoids 10/06/2016   Tachycardia 08/14/2016   Hemorrhoids 08/13/2016   Atypical migraine 08/03/2016   CAD (coronary artery disease)    AAA (abdominal aortic aneurysm) (HCC)    Chronic combined systolic (congestive) and diastolic (congestive) heart failure (HCC)    Prediabetes 02/11/2016   Generalized anxiety disorder 12/08/2015   Panic attacks 07/28/2015   Chest pain 07/09/2013   Sensorineural hearing loss 03/27/2013   Vitamin D deficiency 07/03/2012   H/O aortic dissection 05/12/2012   Routine general medical examination at a health care facility 09/20/2011   Atypical chest pain 09/15/2011   S/P AVR (aortic valve replacement) 01/07/2011   CORONARY ARTERY BYPASS GRAFT, HX OF 01/07/2011   Arthritis 12/20/2010   Back pain 12/15/2010   H/O dissecting abdominal aortic aneurysm repair 08/24/2010   IRRITABLE BOWEL SYNDROME 09/09/2009   Obstructive sleep apnea 08/04/2009   External hemorrhoid 06/26/2009   Osteoporosis 01/09/2009   Hyperlipidemia 07/26/2007   Depression with anxiety 07/26/2007   Essential hypertension 07/26/2007   Allergic rhinitis 07/26/2007   Asthma 07/26/2007   GERD 07/26/2007   Osteoarthritis 07/26/2007   Ocular migraine 07/26/2007   Mild intermittent asthma without complication 07/26/2007    PCP: [name] MD   REFERRING PROVIDER: Stormy Card  REFERRING DIAG: L rotator cuff   THERAPY DIAG:  Left shoulder pain, unspecified chronicity  Stiffness of left shoulder, not elsewhere  classified  Abnormal posture  Muscle weakness (generalized)  Rationale for Evaluation and Treatment: Rehabilitation  ONSET DATE: Feb 2024  SUBJECTIVE:  SUBJECTIVE STATEMENT:  Patient reports B shoulders hurt this AM (L>R). Patient report sister is upstairs getting leg amputated.   Hand dominance: Left  PERTINENT HISTORY: Regina Tener is a 66yoF familiar to our services. Pt referred to PT for strengthening of chronic left shoulder DJD, known remote RC injury. Recently fell and broke four metatarsals, since cleared from use of CAM rocker. She was treated successfully in past for right shoulder pain and has past medical history of Right shoulder surgery replacement. 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 PMH aorta type A dissection in sept 2011 s/p aortic valve replacement, bypass of R coronary artery, anemia, anxiety, CAD, CHF, depression, GERD, Heart failure, HLD, HTN, IBS, migraine, osteoarthritis, osteoporosis, RA, back pain.     PAIN:  Are you having pain? Yes, 3/10 pain in lateral deltoids in the shoulder.  PRECAUTIONS: None  FALLS:  Has patient fallen in last 6 months? Yes. Number of falls 2  PATIENT GOALS:to lift more than 3lb with LUE, be able to eat a meal without arm being tired.    OBJECTIVE:    TODAY'S TREATMENT: DATE: 09/01/23   TherEx:  Supine:   Rhythmic stabilization exercises, 30 sec bouts with therapist applied resistance started proximal and moving distally for increased difficulty, x2 each UE AROM shoulder flexion x 10 1# DB each LE - significant pain during eccentric motion on B shoulders   Seated:  RTB row, 2x10 each UE RTB resisted ER bilaterally, with elbows tucked by side, 4x5 Bicep curl 2# DB 2 x 10 each UE   Manual:  Supine STM to the UT's bilaterally  for pain modulation Supine R/L UT stretch with ipsilateral GHJ overpressure 3 x 30 seconds each side   PATIENT EDUCATION: Education details: goals, POC  Person educated: Patient Education method: Explanation, Demonstration, Tactile cues, Verbal cues, and Handouts Education comprehension: verbalized understanding, returned demonstration, verbal cues required, and tactile cues required  HOME EXERCISE PROGRAM: Access Code: [email or web address] URL: [email or web address] Date: 07/11/2023 Prepared by: Precious Bard  Exercises - Seated Scapular Retraction  - 1 x daily - 7 x weekly - 2 sets - 10 reps - 5 hold - Neck Sidebending  - 1 x daily - 7 x weekly - 2 sets - 2 reps - 30 hold - Supine Isometric Shoulder Extension with Towel  - 1 x daily - 7 x weekly - 2 sets - 10 reps - 5 hold - Supine Isometric Shoulder Adduction with Towel Roll  - 1 x daily - 7 x weekly - 2 sets - 10 reps - 5 hold  - table slides! 8/13   ASSESSMENT:  CLINICAL IMPRESSION:  Patient arrives to treatment session complaining of L shoulder pain at mediolateral deltoid and UT. Session focused on manual therapy and light UE strengthening. Complaining of sharp pain in medial deltoid with supine flexion with weight. Pt will continue to benefit from skilled therapy to address remaining deficits in order to improve overall QoL and return to PLOF.     OBJECTIVE IMPAIRMENTS: cardiopulmonary status limiting activity, decreased activity tolerance, decreased mobility, increased fascial restrictions, impaired perceived functional ability, increased muscle spasms, impaired flexibility, impaired UE functional use, improper body mechanics, postural dysfunction, and pain.   ACTIVITY LIMITATIONS: carrying, lifting, bed mobility, bathing, dressing, self feeding, reach over head, and caring for others  PARTICIPATION LIMITATIONS: meal prep, cleaning, laundry, personal finances, interpersonal relationship, driving, shopping, community  activity, yard work, church, and caregiving for husband  PERSONAL FACTORS: Age, Fitness, Past/current experiences, Time since onset  of injury/illness/exacerbation, and 3+ comorbidities: aorta type A dissection in sept 2011 with aortic valve replacement bypass of R coronary artery, AAA, anemia, anxiety, CAD, CHF, depression, GERD, Heart failure, HLD, HTN, IBS, Kidney problems, migraine, osteoarthritis, osteoporosis, RA, ocular migraines, back pain  are also affecting patient's functional outcome.   REHAB POTENTIAL: Fair    CLINICAL DECISION MAKING: Evolving/moderate complexity  EVALUATION COMPLEXITY: Moderate   GOALS: Goals reviewed with patient? Yes  SHORT TERM GOALS: Target date: 08/09/2023   Patient will be independent in home exercise program to improve strength/mobility for better functional independence with ADLs. Baseline:7/23: HEP given  Goal status: INITIAL    LONG TERM GOALS: Target date: 09/05/2023   Patient will increase FOTO score to equal to or greater than  57%   to demonstrate statistically significant improvement in mobility and quality of life.  Baseline: 7/22: 46 Goal status: INITIAL  2.  Patient will improve shoulder AROM to > 140 degrees of flexion, scaption, and abduction for improved ability to perform overhead activities. Baseline: 7/22: see above Goal status: INITIAL  3.  Patient will demonstrate adequate shoulder ROM and strength to be able to shave and dress independently with pain less than 3/10. Baseline: 7/23: 6/10 pain with dressing, limited ROM, unable to test strength  Goal status: INITIAL  4.  Patient will decrease Quick DASH score by > 8 points demonstrating reduced self-reported upper extremity disability. Baseline: 7/23: 50% Goal status: INITIAL  5.  Patient will be able to eat an entire meal without resting arm demonstrating improved functional strength Baseline: 7/23: has to rest while eating due to pain.  Goal status:  INITIAL   PLAN:  PT FREQUENCY: 1x/week  PT DURATION: 8 weeks  PLANNED INTERVENTIONS: Therapeutic exercises, Therapeutic activity, Neuromuscular re-education, Balance training, Gait training, Patient/Family education, Self Care, Joint mobilization, Vestibular training, Canalith repositioning, Visual/preceptual remediation/compensation, Dry Needling, Electrical stimulation, Spinal mobilization, Cryotherapy, Moist heat, Compression bandaging, scar mobilization, Splintting, Taping, Traction, Ultrasound, Biofeedback, Ionotophoresis 4mg /ml Dexamethasone, Manual therapy, and Re-evaluation  PLAN FOR NEXT SESSION:   Continue with LUE strengthening ,progress as tolerated.    Maylon Peppers, PT, DPT Physical Therapist - [facility]  09/01/23, 12:57 PM

## 2023-09-01 ENCOUNTER — Ambulatory Visit: Payer: Medicare HMO

## 2023-09-01 ENCOUNTER — Other Ambulatory Visit: Payer: Self-pay | Admitting: Cardiology

## 2023-09-01 DIAGNOSIS — R2689 Other abnormalities of gait and mobility: Secondary | ICD-10-CM | POA: Diagnosis not present

## 2023-09-01 DIAGNOSIS — M6281 Muscle weakness (generalized): Secondary | ICD-10-CM | POA: Diagnosis not present

## 2023-09-01 DIAGNOSIS — R293 Abnormal posture: Secondary | ICD-10-CM

## 2023-09-01 DIAGNOSIS — M5459 Other low back pain: Secondary | ICD-10-CM | POA: Diagnosis not present

## 2023-09-01 DIAGNOSIS — G8929 Other chronic pain: Secondary | ICD-10-CM | POA: Diagnosis not present

## 2023-09-01 DIAGNOSIS — M25511 Pain in right shoulder: Secondary | ICD-10-CM | POA: Diagnosis not present

## 2023-09-01 DIAGNOSIS — M25612 Stiffness of left shoulder, not elsewhere classified: Secondary | ICD-10-CM | POA: Diagnosis not present

## 2023-09-01 DIAGNOSIS — M25512 Pain in left shoulder: Secondary | ICD-10-CM | POA: Diagnosis not present

## 2023-09-05 ENCOUNTER — Ambulatory Visit (INDEPENDENT_AMBULATORY_CARE_PROVIDER_SITE_OTHER): Payer: Medicare HMO | Admitting: Psychology

## 2023-09-05 ENCOUNTER — Ambulatory Visit: Payer: Medicare HMO

## 2023-09-05 DIAGNOSIS — M25612 Stiffness of left shoulder, not elsewhere classified: Secondary | ICD-10-CM | POA: Diagnosis not present

## 2023-09-05 DIAGNOSIS — F331 Major depressive disorder, recurrent, moderate: Secondary | ICD-10-CM

## 2023-09-05 DIAGNOSIS — R2689 Other abnormalities of gait and mobility: Secondary | ICD-10-CM | POA: Diagnosis not present

## 2023-09-05 DIAGNOSIS — G8929 Other chronic pain: Secondary | ICD-10-CM

## 2023-09-05 DIAGNOSIS — M6281 Muscle weakness (generalized): Secondary | ICD-10-CM | POA: Diagnosis not present

## 2023-09-05 DIAGNOSIS — R293 Abnormal posture: Secondary | ICD-10-CM | POA: Diagnosis not present

## 2023-09-05 DIAGNOSIS — M25512 Pain in left shoulder: Secondary | ICD-10-CM | POA: Diagnosis not present

## 2023-09-05 DIAGNOSIS — M25511 Pain in right shoulder: Secondary | ICD-10-CM | POA: Diagnosis not present

## 2023-09-05 DIAGNOSIS — M5459 Other low back pain: Secondary | ICD-10-CM | POA: Diagnosis not present

## 2023-09-05 NOTE — Therapy (Signed)
OUTPATIENT PHYSICAL THERAPY TREATMENT/RECERT   Patient Name: Rachel Torres MRN: [id number] DOB:[birth year], 67 y.o., female Today's Date: 09/05/2023  END OF SESSION:  PT End of Session - 09/05/23 1618     Visit Number 7    Number of Visits 16    Date for PT Re-Evaluation 10/31/23    Authorization Type [employer]  Medicare HMO    Authorization Time Period 07/11/23-09/05/23    Progress Note Due on Visit 10    PT Start Time 1615    PT Stop Time 1659    PT Time Calculation (min) 44 min    Activity Tolerance Patient tolerated treatment well    Behavior During Therapy WFL for tasks assessed/performed                Past Medical History:  Diagnosis Date   AAA (abdominal aortic aneurysm) (HCC)    a. Chronic w/o evidence of aneurysmal dil on CTA 01/2016.   Alcohol abuse    Allergy    Anemia    Anxiety    Asthma    CAD (coronary artery disease)    a. 08/2010 s/p CABG x 1 (VG->RCA) @ time of Ao dissection repair; b. 01/2016 Lexiscan MV: mid antsept/apical defect w/ ? peri-infarct ischemia-->likely attenuation-->Med Rx.   Chewing difficulty    Chronic combined systolic (congestive) and diastolic (congestive) heart failure (HCC)    a. 2012 EF 30-35%; b. 04/2015 EF 40-45%; c. 01/2016 Echo: Ef 55-65%, nl AoV bioprosthesis, nl RV, nl PASP.   Constipation    Depression    Edema of both lower extremities    Gallbladder sludge    GERD (gastroesophageal reflux disease)    Heart failure (HCC)    Heart valve problem    History of Bicuspid Aortic Valve    a. 08/2010 s/p AVR @ time of Ao dissection repair; b. 01/2016 Echo: Ef 55-65%, nl AoV bioprosthesis, nl RV, nl PASP.   Hx of repair of dissecting thoracic aortic aneurysm, Stanford type A    a. 08/2010 s/p repair with AVR and VG->RCA; b. 01/2016 CTA: stable appearance of Asc Thoracic Aortic graft. Opacification of flase lumen of chronic abd Ao dissection w/ retrograde flow through lumbar arteries. No aneurysmal dil of flase lumen.    Hyperlipidemia    Hypertension    Hypertensive heart disease    IBS (irritable bowel syndrome)    Internal hemorrhoids    Kidney problem    Marfan syndrome    pt denies   Migraine    Obstructive sleep apnea    Osteoarthritis    Osteoporosis    Palpitations    Pneumonia    RA (rheumatoid arthritis) (HCC)    a. Followed by Dr. Mallie Mussel   Past Surgical History:  Procedure Laterality Date   ABDOMINAL AORTIC ANEURYSM REPAIR     CARDIAC CATHETERIZATION  2001   CESAREAN SECTION  1986 & 1991   CHOLECYSTECTOMY     COLONOSCOPY     ESOPHAGOGASTRODUODENOSCOPY (EGD) WITH PROPOFOL N/A 05/08/2020   Procedure: ESOPHAGOGASTRODUODENOSCOPY (EGD) WITH PROPOFOL;  Surgeon: Pasty Spillers, MD;  Location: [facility] ENDOSCOPY;  Service: Endoscopy;  Laterality: N/A;   FRACTURE SURGERY Right    shoulder replacement   HEMORRHOID BANDING     ORIF DISTAL RADIUS FRACTURE Left    UPPER GASTROINTESTINAL ENDOSCOPY     Patient Active Problem List   Diagnosis Date Noted   Multiple closed tarsal fractures of left foot, sequela 07/12/2023   Arm contusion, right, sequela 07/12/2023  Lumbar disc disease 07/12/2023   Atrial fibrillation (HCC) 06/09/2023   Paresthesia 06/03/2023   BMI 34.0-34.9,adult 02/17/2023   Obesity, Beginning BMI 35.45 02/17/2023   Tinnitus 01/14/2023   Change in vision 01/14/2023   Colon cancer screening 01/14/2023   Personal history of fall 01/14/2023   Problem related to unspecified psychosocial circumstances 12/28/2022   Person encountering health services to consult on behalf of another person 12/28/2022   Palpitation 12/10/2022   Fatigue 10/26/2022   B12 deficiency 10/26/2022   Right shoulder pain 09/15/2022   Depression 09/14/2022   Class 2 severe obesity with serious comorbidity and body mass index (BMI) of 35.0 to 35.9 in adult [facility]) 09/14/2022   Other constipation 05/12/2022   Medicare annual wellness visit, initial 07/17/2021   TMJ (dislocation of temporomandibular  joint) 07/17/2021   Encounter for screening mammogram for breast cancer 06/05/2021   Diarrhea 04/11/2020   Epigastric pain 02/19/2020   Dark stools 02/19/2020   History of atrial fibrillation 02/05/2019   Shortness of breath 01/27/2019   Irregular surface of cornea 12/25/2018   HPV (human papilloma virus) infection 12/01/2016   Screening mammogram, encounter for 11/29/2016   Estrogen deficiency 11/29/2016   Encounter for routine gynecological examination 11/29/2016   Grade III hemorrhoids 10/06/2016   Tachycardia 08/14/2016   Hemorrhoids 08/13/2016   Atypical migraine 08/03/2016   CAD (coronary artery disease)    AAA (abdominal aortic aneurysm) (HCC)    Chronic combined systolic (congestive) and diastolic (congestive) heart failure (HCC)    Prediabetes 02/11/2016   Generalized anxiety disorder 12/08/2015   Panic attacks 07/28/2015   Chest pain 07/09/2013   Sensorineural hearing loss 03/27/2013   Vitamin D deficiency 07/03/2012   H/O aortic dissection 05/12/2012   Routine general medical examination at a health care facility 09/20/2011   Atypical chest pain 09/15/2011   S/P AVR (aortic valve replacement) 01/07/2011   CORONARY ARTERY BYPASS GRAFT, HX OF 01/07/2011   Arthritis 12/20/2010   Back pain 12/15/2010   H/O dissecting abdominal aortic aneurysm repair 08/24/2010   IRRITABLE BOWEL SYNDROME 09/09/2009   Obstructive sleep apnea 08/04/2009   External hemorrhoid 06/26/2009   Osteoporosis 01/09/2009   Hyperlipidemia 07/26/2007   Depression with anxiety 07/26/2007   Essential hypertension 07/26/2007   Allergic rhinitis 07/26/2007   Asthma 07/26/2007   GERD 07/26/2007   Osteoarthritis 07/26/2007   Ocular migraine 07/26/2007   Mild intermittent asthma without complication 07/26/2007    PCP: [name] MD   REFERRING PROVIDER: Stormy Card  REFERRING DIAG: L rotator cuff   THERAPY DIAG:  Left shoulder pain, unspecified chronicity - Plan: PT plan of care  cert/re-cert  Stiffness of left shoulder, not elsewhere classified - Plan: PT plan of care cert/re-cert  Abnormal posture - Plan: PT plan of care cert/re-cert  Muscle weakness (generalized) - Plan: PT plan of care cert/re-cert  Chronic right shoulder pain - Plan: PT plan of care cert/re-cert  Rationale for Evaluation and Treatment: Rehabilitation  ONSET DATE: Feb 2024  SUBJECTIVE:  SUBJECTIVE STATEMENT:  Patient has seen physician, has new orders for R shoulder in combination with L. Patient reports her pain is as high as an 8.   Hand dominance: Left  PERTINENT HISTORY: Rachel Torres is a 66yoF familiar to our services. Pt referred to PT for strengthening of chronic left shoulder DJD, known remote RC injury. Recently fell and broke four metatarsals, since cleared from use of CAM rocker. She was treated successfully in past for right shoulder pain and has past medical history of Right shoulder surgery replacement. 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 PMH aorta type A dissection in sept 2011 s/p aortic valve replacement, bypass of R coronary artery, anemia, anxiety, CAD, CHF, depression, GERD, Heart failure, HLD, HTN, IBS, migraine, osteoarthritis, osteoporosis, RA, back pain.     PAIN:  Are you having pain? Yes, 3/10 pain in lateral deltoids in the shoulder.  PRECAUTIONS: None  FALLS:  Has patient fallen in last 6 months? Yes. Number of falls 2  PATIENT GOALS:to lift more than 3lb with LUE, be able to eat a meal without arm being tired.    OBJECTIVE:  DIAGNOSTIC FINDINGS:  IMPRESSION: Mild degenerative change in the cervical spine without acute fracture or traumatic subluxation.   IMPRESSION: Full width, full width tears of the supraspinatus and infraspinatus tendons with retraction to  the glenoid. Grade 2/3 infraspinatus atrophy.   Moderate tendinosis of the distal subscapularis tendon with articular sided fraying of the cephalad fibers.   Moderate intra-articular long head biceps tendinosis with fraying and possible partial tearing.   Mild glenohumeral and moderate AC joint osteoarthritis.   Degenerative superior labral tearing anteriorly and posteriorly.   PATIENT SURVEYS:  Quick Dash 50%  and FOTO 46%   COGNITION: Overall cognitive status: Within functional limits for tasks assessed                                  SENSATION: WFL   POSTURE: Slight rounded shoulders and shoulder elevation    UPPER EXTREMITY ROM:    Active ROM Right eval 9/16: recert RUE Left eval 9/16 RECERT LUE  Shoulder flexion 64 122 91 92  Shoulder extension        Shoulder abduction 95 88 52 82  Shoulder adduction        Shoulder internal rotation Unable to test at 90 61 degrees 5 40  Shoulder external rotation 18at side of body -5 degrees at 90 degrees 34 35  Elbow flexion [facility]  WFL   Elbow extension [facility]  WFL   Wrist flexion        Wrist extension        Wrist ulnar deviation        Wrist radial deviation        Wrist pronation        Wrist supination        (Blank rows = not tested)   UPPER EXTREMITY MMT:   MMT Right eval Left eval  Shoulder flexion      Shoulder extension      Shoulder abduction      Shoulder adduction      Shoulder internal rotation      Shoulder external rotation      Middle trapezius      Lower trapezius      Elbow flexion      Elbow extension      Wrist flexion  Wrist extension      Wrist ulnar deviation      Wrist radial deviation      Wrist pronation      Wrist supination      Grip strength (lbs) 30 35  (Blank rows = not tested) Unable to test strength due to pain with motion   SHOULDER SPECIAL TESTS: Impingement tests:  unable to test due to pain in starting position SLAP lesions:  unable to test due to pain in starting  position Instability tests:  unable to test due to pain in starting position Rotator cuff assessment:  unable to test due to pain in starting position Biceps assessment:  unable to test due to pain in starting position   JOINT MOBILITY TESTING:  AP: painful PA: painful Inferior: painful Distraction: reduces pain    PALPATION:  Tenderness to all joint regions of GH   TODAY'S TREATMENT: DATE: 09/05/23 Goals performed see below:   TherEx:  Posture education   Manual: PROM flexion, abduction, ER, IR 10x each direction each UE hold 5-10 seconds Gentle distraction 10x  Palpation for TDN  Trigger Point Dry Needling (TDN), unbilled Education performed with patient regarding potential benefit of TDN. Reviewed precautions and risks with patient. Reviewed special precautions/risks over lung fields which include pneumothorax. Reviewed signs and symptoms of pneumothorax and advised pt to go to ER immediately if these symptoms develop advise them of dry needling treatment. Extensive time spent with pt to ensure full understanding of TDN risks. Pt provided verbal consent to treatment. TDN performed to  with 0.25 x 40 single needle placements with local twitch response (LTR). Pistoning technique utilized. Improved pain-free motion following intervention. L upper trap L ant delt   PATIENT EDUCATION: Education details: goals, POC  Person educated: Patient Education method: Explanation, Demonstration, Tactile cues, Verbal cues, and Handouts Education comprehension: verbalized understanding, returned demonstration, verbal cues required, and tactile cues required  HOME EXERCISE PROGRAM: Access Code: [email or web address] URL: [email or web address] Date: 07/11/2023 Prepared by: Precious Bard  Exercises - Seated Scapular Retraction  - 1 x daily - 7 x weekly - 2 sets - 10 reps - 5 hold - Neck Sidebending  - 1 x daily - 7 x weekly - 2 sets - 2 reps - 30 hold - Supine Isometric Shoulder Extension  with Towel  - 1 x daily - 7 x weekly - 2 sets - 10 reps - 5 hold - Supine Isometric Shoulder Adduction with Towel Roll  - 1 x daily - 7 x weekly - 2 sets - 10 reps - 5 hold  - table slides! 8/13   ASSESSMENT:  CLINICAL IMPRESSION:   Patient's orders now are for bilateral shoulders. QuickDash altered to include bilateral shoulders as per physician orders. Patient's caregiving status limits full progression at this time. Patient has met her goal for pain with dressing demonstrating improved ADL performance and quality of life. Patient agreeable with POC. Pt will continue to benefit from skilled therapy to address remaining deficits in order to improve overall QoL and return to PLOF.     OBJECTIVE IMPAIRMENTS: cardiopulmonary status limiting activity, decreased activity tolerance, decreased mobility, increased fascial restrictions, impaired perceived functional ability, increased muscle spasms, impaired flexibility, impaired UE functional use, improper body mechanics, postural dysfunction, and pain.   ACTIVITY LIMITATIONS: carrying, lifting, bed mobility, bathing, dressing, self feeding, reach over head, and caring for others  PARTICIPATION LIMITATIONS: meal prep, cleaning, laundry, personal finances, interpersonal relationship, driving, shopping, community activity, yard work, church, and caregiving for  husband  PERSONAL FACTORS: Age, Fitness, Past/current experiences, Time since onset of injury/illness/exacerbation, and 3+ comorbidities: aorta type A dissection in sept 2011 with aortic valve replacement bypass of R coronary artery, AAA, anemia, anxiety, CAD, CHF, depression, GERD, Heart failure, HLD, HTN, IBS, Kidney problems, migraine, osteoarthritis, osteoporosis, RA, ocular migraines, back pain  are also affecting patient's functional outcome.   REHAB POTENTIAL: Fair    CLINICAL DECISION MAKING: Evolving/moderate complexity  EVALUATION COMPLEXITY: Moderate   GOALS: Goals reviewed with  patient? Yes  SHORT TERM GOALS: Target date: 08/09/2023   Patient will be independent in home exercise program to improve strength/mobility for better functional independence with ADLs. Baseline:7/23: HEP given 9/16: compliant 1x/week  Goal status: On going     LONG TERM GOALS: Target date: 10/31/2023     Patient will increase FOTO score to equal to or greater than  57%   to demonstrate statistically significant improvement in mobility and quality of life.  Baseline: 7/22: 46 9/16: 50%  Goal status: Partially Met  2.  Patient will improve shoulder AROM to > 140 degrees of flexion, scaption, and abduction for improved ability to perform overhead activities. Baseline: 7/22: see above 9/16: see above Goal status: Partially Met   3.  Patient will demonstrate adequate shoulder ROM and strength to be able to shave and dress independently with pain less than 3/10. Baseline: 7/23: 6/10 pain with dressing, limited ROM, unable to test strength 9/16: 3/10 pain with dressing Goal status: MET  4.  Patient will decrease Quick DASH score by > 8 points demonstrating reduced self-reported upper extremity disability for L and R shoulder  Baseline: 7/23: L shoulder 50% 9/16: R=54.54 % L 66%  Goal status: Modified   5.  Patient will be able to eat an entire meal without resting arm demonstrating improved functional strength Baseline: 7/23: has to rest while eating due to pain. 9/16: able to make halfway through meal Goal status: Partially Met   PLAN:  PT FREQUENCY: 1x/week  PT DURATION: 8 weeks  PLANNED INTERVENTIONS: Therapeutic exercises, Therapeutic activity, Neuromuscular re-education, Balance training, Gait training, Patient/Family education, Self Care, Joint mobilization, Vestibular training, Canalith repositioning, Visual/preceptual remediation/compensation, Dry Needling, Electrical stimulation, Spinal mobilization, Cryotherapy, Moist heat, Compression bandaging, scar mobilization,  Splintting, Taping, Traction, Ultrasound, Biofeedback, Ionotophoresis 4mg /ml Dexamethasone, Manual therapy, and Re-evaluation  PLAN FOR NEXT SESSION:   Continue with LUE strengthening ,progress as tolerated.    Precious Bard PT  Physical Therapist - [facility] [place]  09/05/23, 5:13 PM

## 2023-09-05 NOTE — Progress Notes (Signed)
[facility] Counselor/Therapist Progress Note  Patient ID: NIAMH PINELA, MRN: [id number],    Date: 09/05/2023  Time Spent: 2:00pm-2:55pm   55 minutes   Treatment Type: Individual Therapy  Reported Symptoms: stress  Mental Status Exam: Appearance:  Casual     Behavior: Appropriate  Motor: Normal  Speech/Language:  Normal Rate  Affect: Appropriate  Mood: normal  Thought process: normal  Thought content:   WNL  Sensory/Perceptual disturbances:   WNL  Orientation: oriented to person, place, time/date, and situation  Attention: Good  Concentration: Good  Memory: WNL  Fund of knowledge:  Good  Insight:   Good  Judgment:  Good  Impulse Control: Good   Risk Assessment: Danger to Self:  No Self-injurious Behavior: No Danger to Others: No Duty to Warn:no Physical Aggression / Violence:No  Access to Firearms a concern: No  Gang Involvement:No   Subjective: Pt present for face-to-face individual therapy via video.  Pt consents to telehealth video session and is aware of limitations and benefits of video sessions. Location of pt: home Location of therapist: home office.    Pt talked about her sister who is in the hospital.  [name] sister had to have her foot amputated bc of diabetes related gangrene.  Addressed pt's concerns about her sister.   Pt talked about not sleeping well the past few nights.   Addressed the sleep disruption and worked on sleep strategies.  Pt talked about her health.   She just started physical therapy and has follow up doctors' appointments.   Pt is having issues with her leg, hip, and shoulder.   Pt talked about [name].   Pt is working on getting more caregiving resources.   Worked on self care strategies.  Provided supportive therapy.  Interventions: Cognitive Behavioral Therapy and Insight-Oriented  Diagnosis:  F33.1  Plan of Care: Recommend ongoing therapy.   Pt participated in setting treatment goals.   Pt needs a place  to talk about stressors.  Plan to meet every two weeks.    Treatment Plan (target date: 03/23/2024) Client Abilities/Strengths  Pt is bright, engaging, and motivated for therapy.   Client Treatment Preferences  Individual therapy.  Client Statement of Needs  Improve coping skills.  Have a place to talk about stressors.   Symptoms  Depressed or irritable mood. Unresolved grief issues.  Problems Addressed  Unipolar Depression Goals 1. Alleviate depressive symptoms and return to previous level of effective functioning. 2. Appropriately grieve the loss in order to normalize mood and to return to previously adaptive level of functioning. Objective Learn and implement behavioral strategies to overcome depression. Target Date: 2024-03-23 Frequency: Biweekly  Progress: 40 Modality: individual  Related Interventions Engage the client in "behavioral activation," increasing his/her activity level and contact with sources of reward, while identifying processes that inhibit activation.  Use behavioral techniques such as instruction, rehearsal, role-playing, role reversal, as needed, to facilitate activity in the client's daily life; reinforce success. Assist the client in developing skills that increase the likelihood of deriving pleasure from behavioral activation (e.g., assertiveness skills, developing an exercise plan, less internal/more external focus, increased social involvement); reinforce success. Objective Identify important people in life, past and present, and describe the quality, good and poor, of those relationships. Target Date: 2024-03-23 Frequency: Biweekly  Progress: 40 Modality: individual  Related Interventions Conduct Interpersonal Therapy beginning with the assessment of the client's "interpersonal inventory" of important past and present relationships; develop a case formulation linking depression to grief, interpersonal role  disputes, role transitions, and/or interpersonal  deficits). Objective Learn and implement problem-solving and decision-making skills. Target Date: 2024-03-23 Frequency: Biweekly  Progress: 40 Modality: individual  Related Interventions Conduct Problem-Solving Therapy using techniques such as psychoeducation, modeling, and role-playing to teach client problem-solving skills (i.e., defining a problem specifically, generating possible solutions, evaluating the pros and cons of each solution, selecting and implementing a plan of action, evaluating the efficacy of the plan, accepting or revising the plan); role-play application of the problem-solving skill to a real life issue. Encourage in the client the development of a positive problem orientation in which problems and solving them are viewed as a natural part of life and not something to be feared, despaired, or avoided. 3. Develop healthy interpersonal relationships that lead to the alleviation and help prevent the relapse of depression. 4. Develop healthy thinking patterns and beliefs about self, others, and the world that lead to the alleviation and help prevent the relapse of depression. 5. Recognize, accept, and cope with feelings of depression. Diagnosis F33.1  Conditions For Discharge Achievement of treatment goals and objectives   Salomon Fick, LCSW

## 2023-09-06 ENCOUNTER — Ambulatory Visit: Payer: Medicare HMO | Admitting: Psychology

## 2023-09-06 ENCOUNTER — Other Ambulatory Visit: Payer: Self-pay

## 2023-09-06 MED ORDER — FUROSEMIDE 20 MG PO TABS: 40.0000 mg | ORAL_TABLET | Freq: Every day | ORAL | 3 refills | Status: DC

## 2023-09-06 NOTE — Telephone Encounter (Signed)
Advise on refill.  Entered under historical provider and not filled by Dr. [name].  Also entered as Furosemide 40 mg every day but request is for Furosemide 20mg  take 2 tablets one day and the next day take 1 tablet, alternating daily as directed.

## 2023-09-07 DIAGNOSIS — M5416 Radiculopathy, lumbar region: Secondary | ICD-10-CM | POA: Diagnosis not present

## 2023-09-07 DIAGNOSIS — Z6835 Body mass index (BMI) 35.0-35.9, adult: Secondary | ICD-10-CM | POA: Diagnosis not present

## 2023-09-08 ENCOUNTER — Ambulatory Visit: Payer: Medicare HMO

## 2023-09-08 ENCOUNTER — Other Ambulatory Visit: Payer: Self-pay | Admitting: Neurosurgery

## 2023-09-08 DIAGNOSIS — R293 Abnormal posture: Secondary | ICD-10-CM | POA: Diagnosis not present

## 2023-09-08 DIAGNOSIS — M5459 Other low back pain: Secondary | ICD-10-CM | POA: Diagnosis not present

## 2023-09-08 DIAGNOSIS — M25512 Pain in left shoulder: Secondary | ICD-10-CM | POA: Diagnosis not present

## 2023-09-08 DIAGNOSIS — G8929 Other chronic pain: Secondary | ICD-10-CM | POA: Diagnosis not present

## 2023-09-08 DIAGNOSIS — M6281 Muscle weakness (generalized): Secondary | ICD-10-CM | POA: Diagnosis not present

## 2023-09-08 DIAGNOSIS — M5416 Radiculopathy, lumbar region: Secondary | ICD-10-CM

## 2023-09-08 DIAGNOSIS — M25612 Stiffness of left shoulder, not elsewhere classified: Secondary | ICD-10-CM | POA: Diagnosis not present

## 2023-09-08 DIAGNOSIS — R2689 Other abnormalities of gait and mobility: Secondary | ICD-10-CM | POA: Diagnosis not present

## 2023-09-08 DIAGNOSIS — M25511 Pain in right shoulder: Secondary | ICD-10-CM | POA: Diagnosis not present

## 2023-09-08 NOTE — Therapy (Signed)
OUTPATIENT PHYSICAL THERAPY TREATMENT   Patient Name: NALDA FIALLOS MRN: [id number] DOB:12-02-1956, 67 y.o., female Today's Date: 09/08/2023  END OF SESSION:  PT End of Session - 09/08/23 1409     Visit Number 8    Number of Visits 16    Date for PT Re-Evaluation 10/31/23    Authorization Type [employer]  Medicare HMO    Authorization Time Period 07/11/23-09/05/23    Progress Note Due on Visit 10    PT Start Time 1402    PT Stop Time 1445    PT Time Calculation (min) 43 min    Activity Tolerance Patient tolerated treatment well    Behavior During Therapy WFL for tasks assessed/performed                Past Medical History:  Diagnosis Date   AAA (abdominal aortic aneurysm) (HCC)    a. Chronic w/o evidence of aneurysmal dil on CTA 01/2016.   Alcohol abuse    Allergy    Anemia    Anxiety    Asthma    CAD (coronary artery disease)    a. 08/2010 s/p CABG x 1 (VG->RCA) @ time of Ao dissection repair; b. 01/2016 Lexiscan MV: mid antsept/apical defect w/ ? peri-infarct ischemia-->likely attenuation-->Med Rx.   Chewing difficulty    Chronic combined systolic (congestive) and diastolic (congestive) heart failure (HCC)    a. 2012 EF 30-35%; b. 04/2015 EF 40-45%; c. 01/2016 Echo: Ef 55-65%, nl AoV bioprosthesis, nl RV, nl PASP.   Constipation    Depression    Edema of both lower extremities    Gallbladder sludge    GERD (gastroesophageal reflux disease)    Heart failure (HCC)    Heart valve problem    History of Bicuspid Aortic Valve    a. 08/2010 s/p AVR @ time of Ao dissection repair; b. 01/2016 Echo: Ef 55-65%, nl AoV bioprosthesis, nl RV, nl PASP.   Hx of repair of dissecting thoracic aortic aneurysm, Stanford type A    a. 08/2010 s/p repair with AVR and VG->RCA; b. 01/2016 CTA: stable appearance of Asc Thoracic Aortic graft. Opacification of flase lumen of chronic abd Ao dissection w/ retrograde flow through lumbar arteries. No aneurysmal dil of flase lumen.   Hyperlipidemia     Hypertension    Hypertensive heart disease    IBS (irritable bowel syndrome)    Internal hemorrhoids    Kidney problem    Marfan syndrome    pt denies   Migraine    Obstructive sleep apnea    Osteoarthritis    Osteoporosis    Palpitations    Pneumonia    RA (rheumatoid arthritis) (HCC)    a. Followed by Dr. Mallie Mussel   Past Surgical History:  Procedure Laterality Date   ABDOMINAL AORTIC ANEURYSM REPAIR     CARDIAC CATHETERIZATION  2001   CESAREAN SECTION  1986 & 1991   CHOLECYSTECTOMY     COLONOSCOPY     ESOPHAGOGASTRODUODENOSCOPY (EGD) WITH PROPOFOL N/A 05/08/2020   Procedure: ESOPHAGOGASTRODUODENOSCOPY (EGD) WITH PROPOFOL;  Surgeon: Pasty Spillers, MD;  Location: [facility];  Service: Endoscopy;  Laterality: N/A;   FRACTURE SURGERY Right    shoulder replacement   HEMORRHOID BANDING     ORIF DISTAL RADIUS FRACTURE Left    UPPER GASTROINTESTINAL ENDOSCOPY     Patient Active Problem List   Diagnosis Date Noted   Multiple closed tarsal fractures of left foot, sequela 07/12/2023   Arm contusion, right, sequela 07/12/2023  Lumbar disc disease 07/12/2023   Atrial fibrillation (HCC) 06/09/2023   Paresthesia 06/03/2023   BMI 34.0-34.9,adult 02/17/2023   Obesity, Beginning BMI 35.45 02/17/2023   Tinnitus 01/14/2023   Change in vision 01/14/2023   Colon cancer screening 01/14/2023   Personal history of fall 01/14/2023   Problem related to unspecified psychosocial circumstances 12/28/2022   Person encountering health services to consult on behalf of another person 12/28/2022   Palpitation 12/10/2022   Fatigue 10/26/2022   B12 deficiency 10/26/2022   Right shoulder pain 09/15/2022   Depression 09/14/2022   Class 2 severe obesity with serious comorbidity and body mass index (BMI) of 35.0 to 35.9 in adult [facility]) 09/14/2022   Other constipation 05/12/2022   Medicare annual wellness visit, initial 07/17/2021   TMJ (dislocation of temporomandibular joint) 07/17/2021    Encounter for screening mammogram for breast cancer 06/05/2021   Diarrhea 04/11/2020   Epigastric pain 02/19/2020   Dark stools 02/19/2020   History of atrial fibrillation 02/05/2019   Shortness of breath 01/27/2019   Irregular surface of cornea 12/25/2018   HPV (human papilloma virus) infection 12/01/2016   Screening mammogram, encounter for 11/29/2016   Estrogen deficiency 11/29/2016   Encounter for routine gynecological examination 11/29/2016   Grade III hemorrhoids 10/06/2016   Tachycardia 08/14/2016   Hemorrhoids 08/13/2016   Atypical migraine 08/03/2016   CAD (coronary artery disease)    AAA (abdominal aortic aneurysm) (HCC)    Chronic combined systolic (congestive) and diastolic (congestive) heart failure (HCC)    Prediabetes 02/11/2016   Generalized anxiety disorder 12/08/2015   Panic attacks 07/28/2015   Chest pain 07/09/2013   Sensorineural hearing loss 03/27/2013   Vitamin D deficiency 07/03/2012   H/O aortic dissection 05/12/2012   Routine general medical examination at a health care facility 09/20/2011   Atypical chest pain 09/15/2011   S/P AVR (aortic valve replacement) 01/07/2011   CORONARY ARTERY BYPASS GRAFT, HX OF 01/07/2011   Arthritis 12/20/2010   Back pain 12/15/2010   H/O dissecting abdominal aortic aneurysm repair 08/24/2010   IRRITABLE BOWEL SYNDROME 09/09/2009   Obstructive sleep apnea 08/04/2009   External hemorrhoid 06/26/2009   Osteoporosis 01/09/2009   Hyperlipidemia 07/26/2007   Depression with anxiety 07/26/2007   Essential hypertension 07/26/2007   Allergic rhinitis 07/26/2007   Asthma 07/26/2007   GERD 07/26/2007   Osteoarthritis 07/26/2007   Ocular migraine 07/26/2007   Mild intermittent asthma without complication 07/26/2007    PCP: [name] MD   REFERRING PROVIDER: Stormy Card  REFERRING DIAG: L rotator cuff   THERAPY DIAG:  Left shoulder pain, unspecified chronicity  Stiffness of left shoulder, not elsewhere  classified  Abnormal posture  Muscle weakness (generalized)  Chronic right shoulder pain  Other low back pain  Rationale for Evaluation and Treatment: Rehabilitation  ONSET DATE: Feb 2024  SUBJECTIVE:  SUBJECTIVE STATEMENT:  Pt reports that the shoulder is weak today.  Pt notes 6/10 when moving it, but she has been trying to avoid movement.  Hand dominance: Left  PERTINENT HISTORY: Clotilda Maciolek is a 66yoF familiar to our services. Pt referred to PT for strengthening of chronic left shoulder DJD, known remote RC injury. Recently fell and broke four metatarsals, since cleared from use of CAM rocker. She was treated successfully in past for right shoulder pain and has past medical history of Right shoulder surgery replacement. 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 PMH aorta type A dissection in sept 2011 s/p aortic valve replacement, bypass of R coronary artery, anemia, anxiety, CAD, CHF, depression, GERD, Heart failure, HLD, HTN, IBS, migraine, osteoarthritis, osteoporosis, RA, back pain.     PAIN:  Are you having pain? Yes, 3/10 pain in lateral deltoids in the shoulder.  PRECAUTIONS: None  FALLS:  Has patient fallen in last 6 months? Yes. Number of falls 2  PATIENT GOALS:to lift more than 3lb with LUE, be able to eat a meal without arm being tired.    OBJECTIVE:  DIAGNOSTIC FINDINGS:  IMPRESSION: Mild degenerative change in the cervical spine without acute fracture or traumatic subluxation.   IMPRESSION: Full width, full width tears of the supraspinatus and infraspinatus tendons with retraction to the glenoid. Grade 2/3 infraspinatus atrophy.   Moderate tendinosis of the distal subscapularis tendon with articular sided fraying of the cephalad fibers.   Moderate intra-articular  long head biceps tendinosis with fraying and possible partial tearing.   Mild glenohumeral and moderate AC joint osteoarthritis.   Degenerative superior labral tearing anteriorly and posteriorly.   PATIENT SURVEYS:  Quick Dash 50%  and FOTO 46%   COGNITION: Overall cognitive status: Within functional limits for tasks assessed                                  SENSATION: WFL   POSTURE: Slight rounded shoulders and shoulder elevation    UPPER EXTREMITY ROM:    Active ROM Right eval 9/16: recert RUE Left eval 9/16 RECERT LUE  Shoulder flexion 64 122 91 92  Shoulder extension        Shoulder abduction 95 88 52 82  Shoulder adduction        Shoulder internal rotation Unable to test at 90 61 degrees 5 40  Shoulder external rotation 18at side of body -5 degrees at 90 degrees 34 35  Elbow flexion [facility]  WFL   Elbow extension [facility]  WFL   Wrist flexion        Wrist extension        Wrist ulnar deviation        Wrist radial deviation        Wrist pronation        Wrist supination        (Blank rows = not tested)   UPPER EXTREMITY MMT:   MMT Right eval Left eval  Shoulder flexion      Shoulder extension      Shoulder abduction      Shoulder adduction      Shoulder internal rotation      Shoulder external rotation      Middle trapezius      Lower trapezius      Elbow flexion      Elbow extension      Wrist flexion      Wrist  extension      Wrist ulnar deviation      Wrist radial deviation      Wrist pronation      Wrist supination      Grip strength (lbs) 30 35  (Blank rows = not tested) Unable to test strength due to pain with motion   SHOULDER SPECIAL TESTS: Impingement tests:  unable to test due to pain in starting position SLAP lesions:  unable to test due to pain in starting position Instability tests:  unable to test due to pain in starting position Rotator cuff assessment:  unable to test due to pain in starting position Biceps assessment:  unable to test  due to pain in starting position   JOINT MOBILITY TESTING:  AP: painful PA: painful Inferior: painful Distraction: reduces pain    PALPATION:  Tenderness to all joint regions of GH     TODAY'S TREATMENT: DATE: 09/08/23   Manual: PROM of the L shoulder into all planes of mobility, 5-10 sec holds in each direction STM with TP release technique applied to B UT's and L biceps region where pt is noting most of the pain Supine scapular stabilization with therapist applied resistance in all directions for improved scapular stability, 30 sec bouts Supine MET for improved overall ER and flexion with contract relax technique    Trigger Point Dry Needling (TDN), unbilled Education performed with patient regarding potential benefit of TDN. Reviewed precautions and risks with patient. Reviewed special precautions/risks over lung fields which include pneumothorax. Reviewed signs and symptoms of pneumothorax and advised pt to go to ER immediately if these symptoms develop advise them of dry needling treatment. Extensive time spent with pt to ensure full understanding of TDN risks. Pt provided verbal consent to treatment. TDN performed to  with 0.25 x 40 single needle placements with local twitch response (LTR). Pistoning technique utilized. Improved pain-free motion following intervention. L upper trap L biceps   PATIENT EDUCATION: Education details: goals, POC  Person educated: Patient Education method: Explanation, Demonstration, Tactile cues, Verbal cues, and Handouts Education comprehension: verbalized understanding, returned demonstration, verbal cues required, and tactile cues required  HOME EXERCISE PROGRAM: Access Code: [email or web address] URL: [email or web address] Date: 07/11/2023 Prepared by: Precious Bard  Exercises - Seated Scapular Retraction  - 1 x daily - 7 x weekly - 2 sets - 10 reps - 5 hold - Neck Sidebending  - 1 x daily - 7 x weekly - 2 sets - 2 reps - 30 hold -  Supine Isometric Shoulder Extension with Towel  - 1 x daily - 7 x weekly - 2 sets - 10 reps - 5 hold - Supine Isometric Shoulder Adduction with Towel Roll  - 1 x daily - 7 x weekly - 2 sets - 10 reps - 5 hold  - table slides! 8/13   ASSESSMENT:  CLINICAL IMPRESSION:   Pt responded well to the manual therapy approaches and was noted to have muscular twitch with the dry needling in both UT's and along the biceps.  Pt overall is improving with manual therapy, however would continue to benefit from strengthening the shoulder in ROM's that are less painful.  Will continue to monitor during sessions moving forward.   Pt will continue to benefit from skilled therapy to address remaining deficits in order to improve overall QoL and return to PLOF.      OBJECTIVE IMPAIRMENTS: cardiopulmonary status limiting activity, decreased activity tolerance, decreased mobility, increased fascial restrictions, impaired perceived functional ability, increased muscle spasms, impaired  flexibility, impaired UE functional use, improper body mechanics, postural dysfunction, and pain.   ACTIVITY LIMITATIONS: carrying, lifting, bed mobility, bathing, dressing, self feeding, reach over head, and caring for others  PARTICIPATION LIMITATIONS: meal prep, cleaning, laundry, personal finances, interpersonal relationship, driving, shopping, community activity, yard work, church, and caregiving for husband  PERSONAL FACTORS: Age, Fitness, Past/current experiences, Time since onset of injury/illness/exacerbation, and 3+ comorbidities: aorta type A dissection in sept 2011 with aortic valve replacement bypass of R coronary artery, AAA, anemia, anxiety, CAD, CHF, depression, GERD, Heart failure, HLD, HTN, IBS, Kidney problems, migraine, osteoarthritis, osteoporosis, RA, ocular migraines, back pain  are also affecting patient's functional outcome.   REHAB POTENTIAL: Fair    CLINICAL DECISION MAKING: Evolving/moderate  complexity  EVALUATION COMPLEXITY: Moderate   GOALS: Goals reviewed with patient? Yes  SHORT TERM GOALS: Target date: 08/09/2023  Patient will be independent in home exercise program to improve strength/mobility for better functional independence with ADLs. Baseline:7/23: HEP given 9/16: compliant 1x/week  Goal status: On going     LONG TERM GOALS: Target date: 10/31/2023  Patient will increase FOTO score to equal to or greater than  57%   to demonstrate statistically significant improvement in mobility and quality of life.  Baseline: 7/22: 46 9/16: 50%  Goal status: Partially Met  2.  Patient will improve shoulder AROM to > 140 degrees of flexion, scaption, and abduction for improved ability to perform overhead activities. Baseline: 7/22: see above 9/16: see above Goal status: Partially Met   3.  Patient will demonstrate adequate shoulder ROM and strength to be able to shave and dress independently with pain less than 3/10. Baseline: 7/23: 6/10 pain with dressing, limited ROM, unable to test strength 9/16: 3/10 pain with dressing Goal status: MET  4.  Patient will decrease Quick DASH score by > 8 points demonstrating reduced self-reported upper extremity disability for L and R shoulder  Baseline: 7/23: L shoulder 50% 9/16: R=54.54 % L 66%  Goal status: Modified   5.  Patient will be able to eat an entire meal without resting arm demonstrating improved functional strength Baseline: 7/23: has to rest while eating due to pain. 9/16: able to make halfway through meal Goal status: Partially Met   PLAN:  PT FREQUENCY: 1x/week  PT DURATION: 8 weeks  PLANNED INTERVENTIONS: Therapeutic exercises, Therapeutic activity, Neuromuscular re-education, Balance training, Gait training, Patient/Family education, Self Care, Joint mobilization, Vestibular training, Canalith repositioning, Visual/preceptual remediation/compensation, Dry Needling, Electrical stimulation, Spinal mobilization,  Cryotherapy, Moist heat, Compression bandaging, scar mobilization, Splintting, Taping, Traction, Ultrasound, Biofeedback, Ionotophoresis 4mg /ml Dexamethasone, Manual therapy, and Re-evaluation  PLAN FOR NEXT SESSION:   Continue with LUE strengthening ,progress as tolerated.    Nolon Bussing, PT, DPT Physical Therapist - [facility]  09/08/23, 5:37 PM

## 2023-09-13 ENCOUNTER — Ambulatory Visit (INDEPENDENT_AMBULATORY_CARE_PROVIDER_SITE_OTHER): Payer: Medicare HMO | Admitting: Psychology

## 2023-09-13 DIAGNOSIS — F331 Major depressive disorder, recurrent, moderate: Secondary | ICD-10-CM | POA: Diagnosis not present

## 2023-09-13 NOTE — Progress Notes (Signed)
[facility] Counselor/Therapist Progress Note  Patient ID: Rachel Torres, MRN: [id number],    Date: 09/13/2023  Time Spent: 10:00am-10:50am   50 minutes   Treatment Type: Individual Therapy  Reported Symptoms: stress  Mental Status Exam: Appearance:  Casual     Behavior: Appropriate  Motor: Normal  Speech/Language:  Normal Rate  Affect: Appropriate  Mood: normal  Thought process: normal  Thought content:   WNL  Sensory/Perceptual disturbances:   WNL  Orientation: oriented to person, place, time/date, and situation  Attention: Good  Concentration: Good  Memory: WNL  Fund of knowledge:  Good  Insight:   Good  Judgment:  Good  Impulse Control: Good   Risk Assessment: Danger to Self:  No Self-injurious Behavior: No Danger to Others: No Duty to Warn:no Physical Aggression / Violence:No  Access to Firearms a concern: No  Gang Involvement:No   Subjective: Pt present for face-to-face individual therapy via video.  Pt consents to telehealth video session and is aware of limitations and benefits of video sessions. Location of pt: home Location of therapist: home office.    Pt talked about a friend staying with them.  He is helping with the cooking and helps out with [name].   Pt talked about her health.   She is in more pain now that she is in PT.  Pt also has back pain and gets an MRI tomorrow.  Pt talked about having a trip planned for this weekend to see her son and the baby.   Pt talked about [name].   Pt is working on getting more caregiving resources.  Pt is worried about how [name] will do when they get their kitchen renovated in a couple of months.   Worked on self care strategies.  Provided supportive therapy.  Interventions: Cognitive Behavioral Therapy and Insight-Oriented  Diagnosis:  F33.1  Plan of Care: Recommend ongoing therapy.   Pt participated in setting treatment goals.   Pt needs a place to talk about stressors.  Plan to meet  every two weeks.    Treatment Plan (target date: 03/23/2024) Client Abilities/Strengths  Pt is bright, engaging, and motivated for therapy.   Client Treatment Preferences  Individual therapy.  Client Statement of Needs  Improve coping skills.  Have a place to talk about stressors.   Symptoms  Depressed or irritable mood. Unresolved grief issues.  Problems Addressed  Unipolar Depression Goals 1. Alleviate depressive symptoms and return to previous level of effective functioning. 2. Appropriately grieve the loss in order to normalize mood and to return to previously adaptive level of functioning. Objective Learn and implement behavioral strategies to overcome depression. Target Date: 2024-03-23 Frequency: Biweekly  Progress: 40 Modality: individual  Related Interventions Engage the client in "behavioral activation," increasing his/her activity level and contact with sources of reward, while identifying processes that inhibit activation.  Use behavioral techniques such as instruction, rehearsal, role-playing, role reversal, as needed, to facilitate activity in the client's daily life; reinforce success. Assist the client in developing skills that increase the likelihood of deriving pleasure from behavioral activation (e.g., assertiveness skills, developing an exercise plan, less internal/more external focus, increased social involvement); reinforce success. Objective Identify important people in life, past and present, and describe the quality, good and poor, of those relationships. Target Date: 2024-03-23 Frequency: Biweekly  Progress: 40 Modality: individual  Related Interventions Conduct Interpersonal Therapy beginning with the assessment of the client's "interpersonal inventory" of important past and present relationships; develop a case formulation linking depression  to grief, interpersonal role disputes, role transitions, and/or interpersonal deficits). Objective Learn and implement  problem-solving and decision-making skills. Target Date: 2024-03-23 Frequency: Biweekly  Progress: 40 Modality: individual  Related Interventions Conduct Problem-Solving Therapy using techniques such as psychoeducation, modeling, and role-playing to teach client problem-solving skills (i.e., defining a problem specifically, generating possible solutions, evaluating the pros and cons of each solution, selecting and implementing a plan of action, evaluating the efficacy of the plan, accepting or revising the plan); role-play application of the problem-solving skill to a real life issue. Encourage in the client the development of a positive problem orientation in which problems and solving them are viewed as a natural part of life and not something to be feared, despaired, or avoided. 3. Develop healthy interpersonal relationships that lead to the alleviation and help prevent the relapse of depression. 4. Develop healthy thinking patterns and beliefs about self, others, and the world that lead to the alleviation and help prevent the relapse of depression. 5. Recognize, accept, and cope with feelings of depression. Diagnosis F33.1  Conditions For Discharge Achievement of treatment goals and objectives   Salomon Fick, LCSW

## 2023-09-14 ENCOUNTER — Ambulatory Visit
Admission: RE | Admit: 2023-09-14 | Discharge: 2023-09-14 | Disposition: A | Discharge status: Home / Self Care | Payer: Medicare HMO | Source: Ambulatory Visit | Attending: Neurosurgery | Admitting: Neurosurgery

## 2023-09-14 DIAGNOSIS — M4856XA Collapsed vertebra, not elsewhere classified, lumbar region, initial encounter for fracture: Secondary | ICD-10-CM | POA: Diagnosis not present

## 2023-09-14 DIAGNOSIS — M5136 Other intervertebral disc degeneration, lumbar region: Secondary | ICD-10-CM | POA: Diagnosis not present

## 2023-09-14 DIAGNOSIS — M5416 Radiculopathy, lumbar region: Secondary | ICD-10-CM | POA: Insufficient documentation

## 2023-09-14 DIAGNOSIS — M47816 Spondylosis without myelopathy or radiculopathy, lumbar region: Secondary | ICD-10-CM | POA: Diagnosis not present

## 2023-09-14 DIAGNOSIS — M5126 Other intervertebral disc displacement, lumbar region: Secondary | ICD-10-CM | POA: Diagnosis not present

## 2023-09-15 ENCOUNTER — Ambulatory Visit (INDEPENDENT_AMBULATORY_CARE_PROVIDER_SITE_OTHER): Payer: Medicare HMO | Admitting: Family Medicine

## 2023-09-15 ENCOUNTER — Encounter (INDEPENDENT_AMBULATORY_CARE_PROVIDER_SITE_OTHER): Payer: Self-pay | Admitting: Family Medicine

## 2023-09-15 ENCOUNTER — Ambulatory Visit: Payer: Medicare HMO

## 2023-09-15 VITALS — BP 105/58 | HR 72 | Temp 98.6°F | Ht 65.0 in | Wt 208.0 lb

## 2023-09-15 DIAGNOSIS — M25512 Pain in left shoulder: Secondary | ICD-10-CM

## 2023-09-15 DIAGNOSIS — F5089 Other specified eating disorder: Secondary | ICD-10-CM | POA: Diagnosis not present

## 2023-09-15 DIAGNOSIS — M6281 Muscle weakness (generalized): Secondary | ICD-10-CM

## 2023-09-15 DIAGNOSIS — R2689 Other abnormalities of gait and mobility: Secondary | ICD-10-CM

## 2023-09-15 DIAGNOSIS — Z6834 Body mass index (BMI) 34.0-34.9, adult: Secondary | ICD-10-CM | POA: Diagnosis not present

## 2023-09-15 DIAGNOSIS — M5459 Other low back pain: Secondary | ICD-10-CM | POA: Diagnosis not present

## 2023-09-15 DIAGNOSIS — F3289 Other specified depressive episodes: Secondary | ICD-10-CM

## 2023-09-15 DIAGNOSIS — M25511 Pain in right shoulder: Secondary | ICD-10-CM | POA: Diagnosis not present

## 2023-09-15 DIAGNOSIS — R293 Abnormal posture: Secondary | ICD-10-CM | POA: Diagnosis not present

## 2023-09-15 DIAGNOSIS — R7303 Prediabetes: Secondary | ICD-10-CM | POA: Diagnosis not present

## 2023-09-15 DIAGNOSIS — M25612 Stiffness of left shoulder, not elsewhere classified: Secondary | ICD-10-CM | POA: Diagnosis not present

## 2023-09-15 DIAGNOSIS — G8929 Other chronic pain: Secondary | ICD-10-CM | POA: Diagnosis not present

## 2023-09-15 DIAGNOSIS — E669 Obesity, unspecified: Secondary | ICD-10-CM

## 2023-09-15 MED ORDER — TOPIRAMATE 50 MG PO TABS: 50.0000 mg | ORAL_TABLET | Freq: Every evening | ORAL | 0 refills | Status: DC

## 2023-09-15 NOTE — Therapy (Signed)
OUTPATIENT PHYSICAL THERAPY TREATMENT   Patient Name: Rachel Torres MRN: [id number] DOB:1956/01/27, 67 y.o., female Today's Date: 09/15/2023  END OF SESSION:  PT End of Session - 09/15/23 1247     Visit Number 9    Number of Visits 16    Date for PT Re-Evaluation 10/31/23    Authorization Type [employer]  Medicare HMO    Authorization Time Period 09/05/23-10/31/23    Progress Note Due on Visit 10    PT Start Time 1145    PT Stop Time 1230    PT Time Calculation (min) 45 min    Activity Tolerance Patient tolerated treatment well    Behavior During Therapy WFL for tasks assessed/performed                Past Medical History:  Diagnosis Date   AAA (abdominal aortic aneurysm) (HCC)    a. Chronic w/o evidence of aneurysmal dil on CTA 01/2016.   Alcohol abuse    Allergy    Anemia    Anxiety    Asthma    CAD (coronary artery disease)    a. 08/2010 s/p CABG x 1 (VG->RCA) @ time of Ao dissection repair; b. 01/2016 Lexiscan MV: mid antsept/apical defect w/ ? peri-infarct ischemia-->likely attenuation-->Med Rx.   Chewing difficulty    Chronic combined systolic (congestive) and diastolic (congestive) heart failure (HCC)    a. 2012 EF 30-35%; b. 04/2015 EF 40-45%; c. 01/2016 Echo: Ef 55-65%, nl AoV bioprosthesis, nl RV, nl PASP.   Constipation    Depression    Edema of both lower extremities    Gallbladder sludge    GERD (gastroesophageal reflux disease)    Heart failure (HCC)    Heart valve problem    History of Bicuspid Aortic Valve    a. 08/2010 s/p AVR @ time of Ao dissection repair; b. 01/2016 Echo: Ef 55-65%, nl AoV bioprosthesis, nl RV, nl PASP.   Hx of repair of dissecting thoracic aortic aneurysm, Stanford type A    a. 08/2010 s/p repair with AVR and VG->RCA; b. 01/2016 CTA: stable appearance of Asc Thoracic Aortic graft. Opacification of flase lumen of chronic abd Ao dissection w/ retrograde flow through lumbar arteries. No aneurysmal dil of flase lumen.   Hyperlipidemia     Hypertension    Hypertensive heart disease    IBS (irritable bowel syndrome)    Internal hemorrhoids    Kidney problem    Marfan syndrome    pt denies   Migraine    Obstructive sleep apnea    Osteoarthritis    Osteoporosis    Palpitations    Pneumonia    RA (rheumatoid arthritis) (HCC)    a. Followed by Dr. Mallie Mussel   Past Surgical History:  Procedure Laterality Date   ABDOMINAL AORTIC ANEURYSM REPAIR     CARDIAC CATHETERIZATION  2001   CESAREAN SECTION  1986 & 1991   CHOLECYSTECTOMY     COLONOSCOPY     ESOPHAGOGASTRODUODENOSCOPY (EGD) WITH PROPOFOL N/A 05/08/2020   Procedure: ESOPHAGOGASTRODUODENOSCOPY (EGD) WITH PROPOFOL;  Surgeon: Pasty Spillers, MD;  Location: [facility] ENDOSCOPY;  Service: Endoscopy;  Laterality: N/A;   FRACTURE SURGERY Right    shoulder replacement   HEMORRHOID BANDING     ORIF DISTAL RADIUS FRACTURE Left    UPPER GASTROINTESTINAL ENDOSCOPY     Patient Active Problem List   Diagnosis Date Noted   Multiple closed tarsal fractures of left foot, sequela 07/12/2023   Arm contusion, right, sequela 07/12/2023  Lumbar disc disease 07/12/2023   Atrial fibrillation (HCC) 06/09/2023   Paresthesia 06/03/2023   BMI 34.0-34.9,adult 02/17/2023   Obesity, Beginning BMI 35.45 02/17/2023   Tinnitus 01/14/2023   Change in vision 01/14/2023   Colon cancer screening 01/14/2023   Personal history of fall 01/14/2023   Problem related to unspecified psychosocial circumstances 12/28/2022   Person encountering health services to consult on behalf of another person 12/28/2022   Palpitation 12/10/2022   Fatigue 10/26/2022   B12 deficiency 10/26/2022   Right shoulder pain 09/15/2022   Depression 09/14/2022   Class 2 severe obesity with serious comorbidity and body mass index (BMI) of 35.0 to 35.9 in adult [facility]) 09/14/2022   Other constipation 05/12/2022   Medicare annual wellness visit, initial 07/17/2021   TMJ (dislocation of temporomandibular joint) 07/17/2021    Encounter for screening mammogram for breast cancer 06/05/2021   Diarrhea 04/11/2020   Epigastric pain 02/19/2020   Dark stools 02/19/2020   History of atrial fibrillation 02/05/2019   Shortness of breath 01/27/2019   Irregular surface of cornea 12/25/2018   HPV (human papilloma virus) infection 12/01/2016   Screening mammogram, encounter for 11/29/2016   Estrogen deficiency 11/29/2016   Encounter for routine gynecological examination 11/29/2016   Grade III hemorrhoids 10/06/2016   Tachycardia 08/14/2016   Hemorrhoids 08/13/2016   Atypical migraine 08/03/2016   CAD (coronary artery disease)    AAA (abdominal aortic aneurysm) (HCC)    Chronic combined systolic (congestive) and diastolic (congestive) heart failure (HCC)    Prediabetes 02/11/2016   Generalized anxiety disorder 12/08/2015   Panic attacks 07/28/2015   Chest pain 07/09/2013   Sensorineural hearing loss 03/27/2013   Vitamin D deficiency 07/03/2012   H/O aortic dissection 05/12/2012   Routine general medical examination at a health care facility 09/20/2011   Atypical chest pain 09/15/2011   S/P AVR (aortic valve replacement) 01/07/2011   CORONARY ARTERY BYPASS GRAFT, HX OF 01/07/2011   Arthritis 12/20/2010   Back pain 12/15/2010   H/O dissecting abdominal aortic aneurysm repair 08/24/2010   IRRITABLE BOWEL SYNDROME 09/09/2009   Obstructive sleep apnea 08/04/2009   External hemorrhoid 06/26/2009   Osteoporosis 01/09/2009   Hyperlipidemia 07/26/2007   Depression with anxiety 07/26/2007   Essential hypertension 07/26/2007   Allergic rhinitis 07/26/2007   Asthma 07/26/2007   GERD 07/26/2007   Osteoarthritis 07/26/2007   Ocular migraine 07/26/2007   Mild intermittent asthma without complication 07/26/2007    PCP: [name] MD   REFERRING PROVIDER: Stormy Card  REFERRING DIAG: L rotator cuff   THERAPY DIAG:  Left shoulder pain, unspecified chronicity  Stiffness of left shoulder, not elsewhere  classified  Abnormal posture  Muscle weakness (generalized)  Chronic right shoulder pain  Other low back pain  Other abnormalities of gait and mobility  Rationale for Evaluation and Treatment: Rehabilitation  ONSET DATE: Feb 2024  SUBJECTIVE:  SUBJECTIVE STATEMENT: Lots of left shoulder pain worse in deltoid area today.   Hand dominance: Left  PERTINENT HISTORY: Rachel Torres is a 66yoF familiar to our services. Pt referred to PT for strengthening of chronic left shoulder DJD, known remote RC injury. Recently fell and broke four metatarsals, since cleared from use of CAM rocker. She was treated successfully in past for right shoulder pain and has past medical history of Right shoulder surgery replacement. 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 PMH aorta type A dissection in sept 2011 s/p aortic valve replacement, bypass of R coronary artery, anemia, anxiety, CAD, CHF, depression, GERD, Heart failure, HLD, HTN, IBS, migraine, osteoarthritis, osteoporosis, RA, back pain.     PAIN:  Are you having pain? Yes, 3/10 pain in lateral deltoids in the shoulder.  PRECAUTIONS: None  FALLS:  Has patient fallen in last 6 months? Yes. Number of falls 2  PATIENT GOALS:to lift more than 3lb with LUE, be able to eat a meal without arm being tired.    OBJECTIVE:     TODAY'S TREATMENT: DATE: 09/15/23  Heat application to bilat shoulders, bilat neck, trigger point dry needling to bilat upper trapezius deltoids, myofascial release to the aforementioned muscles. AA/ROM supine chest press.     PATIENT EDUCATION: Education details: goals, POC  Person educated: Patient Education method: Explanation, Demonstration, Tactile cues, Verbal cues, and Handouts Education comprehension: verbalized understanding,  returned demonstration, verbal cues required, and tactile cues required  HOME EXERCISE PROGRAM: Access Code: [email or web address] URL: [email or web address] Date: 07/11/2023 Prepared by: Precious Bard  Exercises - Seated Scapular Retraction  - 1 x daily - 7 x weekly - 2 sets - 10 reps - 5 hold - Neck Sidebending  - 1 x daily - 7 x weekly - 2 sets - 2 reps - 30 hold - Supine Isometric Shoulder Extension with Towel  - 1 x daily - 7 x weekly - 2 sets - 10 reps - 5 hold - Supine Isometric Shoulder Adduction with Towel Roll  - 1 x daily - 7 x weekly - 2 sets - 10 reps - 5 hold  - table slides! 8/13   ASSESSMENT:  CLINICAL IMPRESSION:   Pt responded well to the manual therapy approaches and was noted to have muscular twitch with the dry needling in both UT and deltoid.  Acute pain exacerbation limited ability to partake in resistance training today. Will continue to monitor during sessions moving forward.   Pt will continue to benefit from skilled therapy to address remaining deficits in order to improve overall QoL and return to PLOF.      OBJECTIVE IMPAIRMENTS: cardiopulmonary status limiting activity, decreased activity tolerance, decreased mobility, increased fascial restrictions, impaired perceived functional ability, increased muscle spasms, impaired flexibility, impaired UE functional use, improper body mechanics, postural dysfunction, and pain.   ACTIVITY LIMITATIONS: carrying, lifting, bed mobility, bathing, dressing, self feeding, reach over head, and caring for others  PARTICIPATION LIMITATIONS: meal prep, cleaning, laundry, personal finances, interpersonal relationship, driving, shopping, community activity, yard work, church, and caregiving for husband  PERSONAL FACTORS: Age, Fitness, Past/current experiences, Time since onset of injury/illness/exacerbation, and 3+ comorbidities: aorta type A dissection in sept 2011 with aortic valve replacement bypass of R coronary artery, AAA,  anemia, anxiety, CAD, CHF, depression, GERD, Heart failure, HLD, HTN, IBS, Kidney problems, migraine, osteoarthritis, osteoporosis, RA, ocular migraines, back pain  are also affecting patient's functional outcome.   REHAB POTENTIAL: Fair    CLINICAL DECISION MAKING: Evolving/moderate complexity  EVALUATION COMPLEXITY:  Moderate   GOALS: Goals reviewed with patient? Yes  SHORT TERM GOALS: Target date: 08/09/2023  Patient will be independent in home exercise program to improve strength/mobility for better functional independence with ADLs. Baseline:7/23: HEP given 9/16: compliant 1x/week  Goal status: On going     LONG TERM GOALS: Target date: 10/31/2023  Patient will increase FOTO score to equal to or greater than  57%   to demonstrate statistically significant improvement in mobility and quality of life.  Baseline: 7/22: 46 9/16: 50%  Goal status: Partially Met  2.  Patient will improve shoulder AROM to > 140 degrees of flexion, scaption, and abduction for improved ability to perform overhead activities. Baseline: 7/22: see above 9/16: see above Goal status: Partially Met   3.  Patient will demonstrate adequate shoulder ROM and strength to be able to shave and dress independently with pain less than 3/10. Baseline: 7/23: 6/10 pain with dressing, limited ROM, unable to test strength 9/16: 3/10 pain with dressing Goal status: MET  4.  Patient will decrease Quick DASH score by > 8 points demonstrating reduced self-reported upper extremity disability for L and R shoulder  Baseline: 7/23: L shoulder 50% 9/16: R=54.54 % L 66%  Goal status: Modified   5.  Patient will be able to eat an entire meal without resting arm demonstrating improved functional strength Baseline: 7/23: has to rest while eating due to pain. 9/16: able to make halfway through meal Goal status: Partially Met   PLAN:  PT FREQUENCY: 1x/week  PT DURATION: 8 weeks  PLANNED INTERVENTIONS: Therapeutic exercises,  Therapeutic activity, Neuromuscular re-education, Balance training, Gait training, Patient/Family education, Self Care, Joint mobilization, Vestibular training, Canalith repositioning, Visual/preceptual remediation/compensation, Dry Needling, Electrical stimulation, Spinal mobilization, Cryotherapy, Moist heat, Compression bandaging, scar mobilization, Splintting, Taping, Traction, Ultrasound, Biofeedback, Ionotophoresis 4mg /ml Dexamethasone, Manual therapy, and Re-evaluation  PLAN FOR NEXT SESSION:   Continue with LUE strengthening ,progress as tolerated.    1:16 PM, 09/15/23 Rosamaria Lints, PT, DPT Physical Therapist - [facility]  Outpatient Physical Therapy- Main Campus [phone]

## 2023-09-15 NOTE — Progress Notes (Signed)
.smr  Office: [phone]  /  Fax: [phone]  WEIGHT SUMMARY AND BIOMETRICS  Anthropometric Measurements Height: 5\' 5"  (1.651 m) Weight: 208 lb (94.3 kg) BMI (Calculated): 34.61 Weight at Last Visit: 207 lb Weight Lost Since Last Visit: 0 Weight Gained Since Last Visit: 1 lb Starting Weight: 213 lb Total Weight Loss (lbs): 5 lb (2.268 kg)   Body Composition  Body Fat %: 46 % Fat Mass (lbs): 95.8 lbs Muscle Mass (lbs): 106.8 lbs Total Body Water (lbs): 75.8 lbs Visceral Fat Rating : 14   Other Clinical Data Fasting: no Labs: no Today's Visit #: 36 Starting Date: 05/05/20    Chief Complaint: OBESITY   History of Present Illness   The patient is a 67 year old individual with a history of obesity and depression, characterized by emotional eating behaviors. Over the past four weeks, she has gained one pound and report adherence to a low-carb diet approximately 60% of the time. She is not currently engaging in any exercise. She is on topamax for her depression and emotional eating and requested a refill during this visit.  The patient has been dealing with some personal stressors, including a new person moving into her home. She reports struggling with portion control due to the new individual's cooking habits. She has been following a high-protein, low-carb diet, which includes vegetables. She denies any recent increase in stress snacking or comfort eating, but admit to occasionally making less ideal food choices when she has gone too long without eating.  She has a family history of diabetes, with three of her siblings having the condition. However, she has managed to maintain her HbA1c below 6, despite being classified as prediabetic.  The patient also reports numbness in her thigh and has recently undergone an MRI for the same. She is experiencing some neuropathy, predominantly in her left foot, and to a lesser extent in her right foot.  In addition to her primary  health concerns, the patient is dealing with some personal stressors. She has a family member who recently lost a leg due to poor health choices, and another family member who is about to undergo cataract surgery. Despite these challenges, the patient is making efforts to improve her health, including making better food choices and considering incorporating exercise into her routine once she has more clarity on her thigh numbness.          PHYSICAL EXAM:  Blood pressure (!) 105/58, pulse 72, temperature 98.6 F (37 C), height 5\' 5"  (1.651 m), weight 208 lb (94.3 kg), SpO2 97%. Body mass index is 34.61 kg/m.  DIAGNOSTIC DATA REVIEWED:  BMET    Component Value Date/Time   NA 141 08/17/2023 0950   NA 143 06/20/2013 0815   K 4.3 08/17/2023 0950   K 3.8 06/20/2013 0815   CL 105 08/17/2023 0950   CL 107 06/20/2013 0815   CO2 22 08/17/2023 0950   CO2 26 06/20/2013 0815   GLUCOSE 113 (H) 08/17/2023 0950   GLUCOSE 89 03/17/2022 1017   GLUCOSE 106 (H) 06/20/2013 0815   GLUCOSE 90 12/29/2006 1251   BUN 15 08/17/2023 0950   BUN 16 06/20/2013 0815   CREATININE 0.99 08/17/2023 0950   CREATININE 0.94 06/20/2013 0815   CREATININE 0.88 04/09/2011 0900   CALCIUM 9.1 08/17/2023 0950   CALCIUM 8.8 06/20/2013 0815   GFRNONAA 68 07/23/2020 1533   GFRNONAA >60 06/20/2013 0815   GFRAA 78 07/23/2020 1533   GFRAA >60 06/20/2013 0815   Lab Results  Component  Value Date   HGBA1C 5.8 (H) 08/17/2023   HGBA1C 5.4 02/10/2016   Lab Results  Component Value Date   INSULIN 18.6 08/17/2023   INSULIN 25.2 (H) 05/05/2020   Lab Results  Component Value Date   TSH 1.280 10/26/2022   CBC    Component Value Date/Time   WBC 5.1 10/26/2022 0902   WBC 5.6 07/10/2021 0810   RBC 4.27 10/26/2022 0902   RBC 4.29 07/10/2021 0810   HGB 13.6 10/26/2022 0902   HCT 42.0 10/26/2022 0902   PLT 333 10/26/2022 0902   MCV 98 (H) 10/26/2022 0902   MCV 94 06/20/2013 0815   MCH 31.9 10/26/2022 0902   MCH 32.5  01/21/2018 1841   MCHC 32.4 10/26/2022 0902   MCHC 33.6 07/10/2021 0810   RDW 13.4 10/26/2022 0902   RDW 13.2 06/20/2013 0815   Iron Studies    Component Value Date/Time   FERRITIN 105.0 12/11/2010 1101   Lipid Panel     Component Value Date/Time   CHOL 161 08/17/2023 0950   TRIG 108 08/17/2023 0950   TRIG 110 12/29/2006 1251   HDL 59 08/17/2023 0950   CHOLHDL 3 03/17/2022 1017   VLDL 14.0 03/17/2022 1017   LDLCALC 83 08/17/2023 0950   LDLDIRECT 189.4 10/13/2011 1054   Hepatic Function Panel     Component Value Date/Time   PROT 7.2 08/17/2023 0950   PROT 7.0 06/20/2013 0815   ALBUMIN 4.3 08/17/2023 0950   ALBUMIN 3.5 06/20/2013 0815   AST 23 08/17/2023 0950   AST 17 06/20/2013 0815   ALT 20 08/17/2023 0950   ALT 26 06/20/2013 0815   ALKPHOS 92 08/17/2023 0950   ALKPHOS 68 06/20/2013 0815   BILITOT 0.4 08/17/2023 0950   BILITOT 0.3 06/20/2013 0815   BILIDIR 0.09 10/27/2015 0838      Component Value Date/Time   TSH 1.280 10/26/2022 0902   Nutritional Lab Results  Component Value Date   VD25OH 45.0 08/17/2023   VD25OH 36.4 02/17/2023   VD25OH 57.6 10/26/2022     Assessment and Plan    Obesity Weight gain of one pound in the last four weeks. Patient reports following a low carb diet approximately 60% of the time and is not currently exercising. Discussed the importance of portion control and meal planning. -Continue low carb diet and consider incorporating more physical activity as tolerated.  Depression with emotional eating Patient is currently on Effexor and reports being too busy to engage in stress snacking or comfort eating. However, she acknowledges making less ideal food choices when she goes too long without eating. -Refill Topamax -Encourage regular mealtimes to avoid making less ideal food choices when hungry.  Prediabetes Patient's HbA1c is 5.8 and fasting glucose is 113. Insulin level is 18, indicating that the pancreas is producing more  insulin to keep blood sugars in check. Discussed the impact of food choices on insulin and blood sugar levels. -Continue monitoring HbA1c, fasting glucose, and insulin levels. -Encourage continued adherence to low carb diet.  General Health Maintenance -Continue monitoring weight, mental health, and blood sugar levels. -Plan for follow-up in one month.        She was informed of the importance of frequent follow up visits to maximize her success with intensive lifestyle modifications for her multiple health conditions.    [name], MD

## 2023-09-19 ENCOUNTER — Ambulatory Visit (INDEPENDENT_AMBULATORY_CARE_PROVIDER_SITE_OTHER): Payer: Medicare HMO | Admitting: Psychology

## 2023-09-19 DIAGNOSIS — F331 Major depressive disorder, recurrent, moderate: Secondary | ICD-10-CM

## 2023-09-19 NOTE — Progress Notes (Signed)
Behavioral Health Counselor/Therapist Progress Note  Patient ID: Rachel Torres, MRN: [id number],    Date: 09/19/2023  Time Spent: 10:00am-10:50am   50 minutes   Treatment Type: Individual Therapy  Reported Symptoms: stress  Mental Status Exam: Appearance:  Casual     Behavior: Appropriate  Motor: Normal  Speech/Language:  Normal Rate  Affect: Appropriate  Mood: normal  Thought process: normal  Thought content:   WNL  Sensory/Perceptual disturbances:   WNL  Orientation: oriented to person, place, time/date, and situation  Attention: Good  Concentration: Good  Memory: WNL  Fund of knowledge:  Good  Insight:   Good  Judgment:  Good  Impulse Control: Good   Risk Assessment: Danger to Self:  No Self-injurious Behavior: No Danger to Others: No Duty to Warn:no Physical Aggression / Violence:No  Access to Firearms a concern: No  Gang Involvement:No   Subjective: Pt present for face-to-face individual therapy via video.  Pt consents to telehealth video session and is aware of limitations and benefits of video sessions. Location of pt: home Location of therapist: home office.    Pt talked about planning to go out of town this weekend to see her grandson.   Pt talked about her health.  She is still having trouble with her back and leg and sees the doctor about the results of the MRI next week.  Addressed pt's health concerns.  Pt talked about her husband Rachel Torres.   He is having cataract surgery this week.   Pt has a friend who will help with caregiving this week. Addressed pt's frustrations with caregiving. Worked on self care strategies.  Provided supportive therapy.  Interventions: Cognitive Behavioral Therapy and Insight-Oriented  Diagnosis:  F33.1  Plan of Care: Recommend ongoing therapy.   Pt participated in setting treatment goals.   Pt needs a place to talk about stressors.  Plan to meet every two weeks.    Treatment Plan (target date: 03/23/2024) Client  Abilities/Strengths  Pt is bright, engaging, and motivated for therapy.   Client Treatment Preferences  Individual therapy.  Client Statement of Needs  Improve coping skills.  Have a place to talk about stressors.   Symptoms  Depressed or irritable mood. Unresolved grief issues.  Problems Addressed  Unipolar Depression Goals 1. Alleviate depressive symptoms and return to previous level of effective functioning. 2. Appropriately grieve the loss in order to normalize mood and to return to previously adaptive level of functioning. Objective Learn and implement behavioral strategies to overcome depression. Target Date: 2024-03-23 Frequency: Biweekly  Progress: 40 Modality: individual  Related Interventions Engage the client in "behavioral activation," increasing his/her activity level and contact with sources of reward, while identifying processes that inhibit activation.  Use behavioral techniques such as instruction, rehearsal, role-playing, role reversal, as needed, to facilitate activity in the client's daily life; reinforce success. Assist the client in developing skills that increase the likelihood of deriving pleasure from behavioral activation (e.g., assertiveness skills, developing an exercise plan, less internal/more external focus, increased social involvement); reinforce success. Objective Identify important people in life, past and present, and describe the quality, good and poor, of those relationships. Target Date: 2024-03-23 Frequency: Biweekly  Progress: 40 Modality: individual  Related Interventions Conduct Interpersonal Therapy beginning with the assessment of the client's "interpersonal inventory" of important past and present relationships; develop a case formulation linking depression to grief, interpersonal role disputes, role transitions, and/or interpersonal deficits). Objective Learn and implement problem-solving and decision-making skills. Target Date: 2024-03-23  Frequency: Biweekly  Progress: 40 Modality: individual  Related Interventions Conduct Problem-Solving Therapy using techniques such as psychoeducation, modeling, and role-playing to teach client problem-solving skills (i.e., defining a problem specifically, generating possible solutions, evaluating the pros and cons of each solution, selecting and implementing a plan of action, evaluating the efficacy of the plan, accepting or revising the plan); role-play application of the problem-solving skill to a real life issue. Encourage in the client the development of a positive problem orientation in which problems and solving them are viewed as a natural part of life and not something to be feared, despaired, or avoided. 3. Develop healthy interpersonal relationships that lead to the alleviation and help prevent the relapse of depression. 4. Develop healthy thinking patterns and beliefs about self, others, and the world that lead to the alleviation and help prevent the relapse of depression. 5. Recognize, accept, and cope with feelings of depression. Diagnosis F33.1  Conditions For Discharge Achievement of treatment goals and objectives   Salomon Fick, LCSW

## 2023-09-26 ENCOUNTER — Ambulatory Visit: Payer: Medicare HMO | Attending: Orthopedic Surgery

## 2023-09-26 DIAGNOSIS — M25511 Pain in right shoulder: Secondary | ICD-10-CM | POA: Diagnosis not present

## 2023-09-26 DIAGNOSIS — G8929 Other chronic pain: Secondary | ICD-10-CM | POA: Diagnosis not present

## 2023-09-26 DIAGNOSIS — M5459 Other low back pain: Secondary | ICD-10-CM | POA: Insufficient documentation

## 2023-09-26 DIAGNOSIS — M25612 Stiffness of left shoulder, not elsewhere classified: Secondary | ICD-10-CM | POA: Diagnosis not present

## 2023-09-26 DIAGNOSIS — M6281 Muscle weakness (generalized): Secondary | ICD-10-CM | POA: Diagnosis not present

## 2023-09-26 DIAGNOSIS — M25512 Pain in left shoulder: Secondary | ICD-10-CM | POA: Insufficient documentation

## 2023-09-26 DIAGNOSIS — R293 Abnormal posture: Secondary | ICD-10-CM | POA: Insufficient documentation

## 2023-09-26 NOTE — Therapy (Signed)
OUTPATIENT PHYSICAL THERAPY TREATMENT/ Physical Therapy Progress Note   Dates of reporting period  07/11/23   to   09/26/23     Patient Name: Rachel Torres MRN: [id number] DOB:1956/02/11, 67 y.o., female Today's Date: 09/26/2023  END OF SESSION:  PT End of Session - 09/26/23 1135     Visit Number 10    Number of Visits 16    Date for PT Re-Evaluation 10/31/23    Authorization Type [employer]  Medicare HMO    Authorization Time Period 09/05/23-10/31/23    Progress Note Due on Visit 10    PT Start Time 1145    PT Stop Time 1229    PT Time Calculation (min) 44 min    Activity Tolerance Patient tolerated treatment well    Behavior During Therapy WFL for tasks assessed/performed                 Past Medical History:  Diagnosis Date   AAA (abdominal aortic aneurysm) (HCC)    a. Chronic w/o evidence of aneurysmal dil on CTA 01/2016.   Alcohol abuse    Allergy    Anemia    Anxiety    Asthma    CAD (coronary artery disease)    a. 08/2010 s/p CABG x 1 (VG->RCA) @ time of Ao dissection repair; b. 01/2016 Lexiscan MV: mid antsept/apical defect w/ ? peri-infarct ischemia-->likely attenuation-->Med Rx.   Chewing difficulty    Chronic combined systolic (congestive) and diastolic (congestive) heart failure (HCC)    a. 2012 EF 30-35%; b. 04/2015 EF 40-45%; c. 01/2016 Echo: Ef 55-65%, nl AoV bioprosthesis, nl RV, nl PASP.   Constipation    Depression    Edema of both lower extremities    Gallbladder sludge    GERD (gastroesophageal reflux disease)    Heart failure (HCC)    Heart valve problem    History of Bicuspid Aortic Valve    a. 08/2010 s/p AVR @ time of Ao dissection repair; b. 01/2016 Echo: Ef 55-65%, nl AoV bioprosthesis, nl RV, nl PASP.   Hx of repair of dissecting thoracic aortic aneurysm, Stanford type A    a. 08/2010 s/p repair with AVR and VG->RCA; b. 01/2016 CTA: stable appearance of Asc Thoracic Aortic graft. Opacification of flase lumen of chronic abd Ao dissection w/  retrograde flow through lumbar arteries. No aneurysmal dil of flase lumen.   Hyperlipidemia    Hypertension    Hypertensive heart disease    IBS (irritable bowel syndrome)    Internal hemorrhoids    Kidney problem    Marfan syndrome    pt denies   Migraine    Obstructive sleep apnea    Osteoarthritis    Osteoporosis    Palpitations    Pneumonia    RA (rheumatoid arthritis) (HCC)    a. Followed by Dr. Mallie Mussel   Past Surgical History:  Procedure Laterality Date   ABDOMINAL AORTIC ANEURYSM REPAIR     CARDIAC CATHETERIZATION  2001   CESAREAN SECTION  1986 & 1991   CHOLECYSTECTOMY     COLONOSCOPY     ESOPHAGOGASTRODUODENOSCOPY (EGD) WITH PROPOFOL N/A 05/08/2020   Procedure: ESOPHAGOGASTRODUODENOSCOPY (EGD) WITH PROPOFOL;  Surgeon: Pasty Spillers, MD;  Location: [facility] ENDOSCOPY;  Service: Endoscopy;  Laterality: N/A;   FRACTURE SURGERY Right    shoulder replacement   HEMORRHOID BANDING     ORIF DISTAL RADIUS FRACTURE Left    UPPER GASTROINTESTINAL ENDOSCOPY     Patient Active Problem List   Diagnosis Date  Noted   Multiple closed tarsal fractures of left foot, sequela 07/12/2023   Arm contusion, right, sequela 07/12/2023   Lumbar disc disease 07/12/2023   Atrial fibrillation (HCC) 06/09/2023   Paresthesia 06/03/2023   BMI 34.0-34.9,adult 02/17/2023   Obesity, Beginning BMI 35.45 02/17/2023   Tinnitus 01/14/2023   Change in vision 01/14/2023   Colon cancer screening 01/14/2023   Personal history of fall 01/14/2023   Problem related to unspecified psychosocial circumstances 12/28/2022   Person encountering health services to consult on behalf of another person 12/28/2022   Palpitation 12/10/2022   Fatigue 10/26/2022   B12 deficiency 10/26/2022   Right shoulder pain 09/15/2022   Depression 09/14/2022   Class 2 severe obesity with serious comorbidity and body mass index (BMI) of 35.0 to 35.9 in adult [facility]) 09/14/2022   Other constipation 05/12/2022   Medicare annual  wellness visit, initial 07/17/2021   TMJ (dislocation of temporomandibular joint) 07/17/2021   Encounter for screening mammogram for breast cancer 06/05/2021   Diarrhea 04/11/2020   Epigastric pain 02/19/2020   Dark stools 02/19/2020   History of atrial fibrillation 02/05/2019   Shortness of breath 01/27/2019   Irregular surface of cornea 12/25/2018   HPV (human papilloma virus) infection 12/01/2016   Screening mammogram, encounter for 11/29/2016   Estrogen deficiency 11/29/2016   Encounter for routine gynecological examination 11/29/2016   Grade III hemorrhoids 10/06/2016   Tachycardia 08/14/2016   Hemorrhoids 08/13/2016   Atypical migraine 08/03/2016   CAD (coronary artery disease)    AAA (abdominal aortic aneurysm) (HCC)    Chronic combined systolic (congestive) and diastolic (congestive) heart failure (HCC)    Prediabetes 02/11/2016   Generalized anxiety disorder 12/08/2015   Panic attacks 07/28/2015   Chest pain 07/09/2013   Sensorineural hearing loss 03/27/2013   Vitamin D deficiency 07/03/2012   H/O aortic dissection 05/12/2012   Routine general medical examination at a health care facility 09/20/2011   Atypical chest pain 09/15/2011   S/P AVR (aortic valve replacement) 01/07/2011   CORONARY ARTERY BYPASS GRAFT, HX OF 01/07/2011   Arthritis 12/20/2010   Back pain 12/15/2010   H/O dissecting abdominal aortic aneurysm repair 08/24/2010   IRRITABLE BOWEL SYNDROME 09/09/2009   Obstructive sleep apnea 08/04/2009   External hemorrhoid 06/26/2009   Osteoporosis 01/09/2009   Hyperlipidemia 07/26/2007   Depression with anxiety 07/26/2007   Essential hypertension 07/26/2007   Allergic rhinitis 07/26/2007   Asthma 07/26/2007   GERD 07/26/2007   Osteoarthritis 07/26/2007   Ocular migraine 07/26/2007   Mild intermittent asthma without complication 07/26/2007    PCP: [name] MD   REFERRING PROVIDER: Stormy Card  REFERRING DIAG: L rotator cuff   THERAPY  DIAG:  Left shoulder pain, unspecified chronicity  Stiffness of left shoulder, not elsewhere classified  Abnormal posture  Muscle weakness (generalized)  Chronic right shoulder pain  Rationale for Evaluation and Treatment: Rehabilitation  ONSET DATE: Feb 2024  SUBJECTIVE:  SUBJECTIVE STATEMENT:  Patient reports severe exacerbation of L shoulder since last session. Reports 9/10 that borderlines on making her dizzy.   Hand dominance: Left  PERTINENT HISTORY: Rachel Torres is a 66yoF familiar to our services. Pt referred to PT for strengthening of chronic left shoulder DJD, known remote RC injury. Recently fell and broke four metatarsals, since cleared from use of CAM rocker. She was treated successfully in past for right shoulder pain and has past medical history of Right shoulder surgery replacement. 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 PMH aorta type A dissection in sept 2011 s/p aortic valve replacement, bypass of R coronary artery, anemia, anxiety, CAD, CHF, depression, GERD, Heart failure, HLD, HTN, IBS, migraine, osteoarthritis, osteoporosis, RA, back pain.     PAIN:  Are you having pain? Yes, 3/10 pain in lateral deltoids in the shoulder.  PRECAUTIONS: None  FALLS:  Has patient fallen in last 6 months? Yes. Number of falls 2  PATIENT GOALS:to lift more than 3lb with LUE, be able to eat a meal without arm being tired.    OBJECTIVE:  DIAGNOSTIC FINDINGS:  IMPRESSION: Mild degenerative change in the cervical spine without acute fracture or traumatic subluxation.   IMPRESSION: Full width, full width tears of the supraspinatus and infraspinatus tendons with retraction to the glenoid. Grade 2/3 infraspinatus atrophy.   Moderate tendinosis of the distal subscapularis tendon  with articular sided fraying of the cephalad fibers.   Moderate intra-articular long head biceps tendinosis with fraying and possible partial tearing.   Mild glenohumeral and moderate AC joint osteoarthritis.   Degenerative superior labral tearing anteriorly and posteriorly.   PATIENT SURVEYS:  Quick Dash 50%  and FOTO 46%   COGNITION: Overall cognitive status: Within functional limits for tasks assessed                                  SENSATION: WFL   POSTURE: Slight rounded shoulders and shoulder elevation    UPPER EXTREMITY ROM:    Active ROM Right eval 9/16: recert RUE Left eval 9/16 RECERT LUE  Shoulder flexion 64 122 91 92  Shoulder extension        Shoulder abduction 95 88 52 82  Shoulder adduction        Shoulder internal rotation Unable to test at 90 61 degrees 5 40  Shoulder external rotation 18at side of body -5 degrees at 90 degrees 34 35  Elbow flexion [facility]  WFL   Elbow extension [facility]  WFL   Wrist flexion        Wrist extension        Wrist ulnar deviation        Wrist radial deviation        Wrist pronation        Wrist supination        (Blank rows = not tested)   UPPER EXTREMITY MMT:   MMT Right eval Left eval  Shoulder flexion      Shoulder extension      Shoulder abduction      Shoulder adduction      Shoulder internal rotation      Shoulder external rotation      Middle trapezius      Lower trapezius      Elbow flexion      Elbow extension      Wrist flexion      Wrist extension  Wrist ulnar deviation      Wrist radial deviation      Wrist pronation      Wrist supination      Grip strength (lbs) 30 35  (Blank rows = not tested) Unable to test strength due to pain with motion   SHOULDER SPECIAL TESTS: Impingement tests:  unable to test due to pain in starting position SLAP lesions:  unable to test due to pain in starting position Instability tests:  unable to test due to pain in starting position Rotator cuff assessment:   unable to test due to pain in starting position Biceps assessment:  unable to test due to pain in starting position   JOINT MOBILITY TESTING:  AP: painful PA: painful Inferior: painful Distraction: reduces pain    PALPATION:  Tenderness to all joint regions of GH     TODAY'S TREATMENT: DATE: 09/26/23  Goals performed: see below for details; ice on shoulders in seated position   Manual: PROM of the L shoulder into all planes of mobility, 5-10 sec holds in each direction Supine scapular stabilization with therapist applied resistance in all directions for improved scapular stability, 30 sec bouts Supine MET for improved overall ER and flexion with contract relax technique  TherEx: Supine: Isometric contraction against PT resistance 50% contraction 5-10 second holds each position each arm: flexion, extension, abduction, adduction  Seated:  UE ranger alphabet each UE   Trigger Point Dry Needling (TDN), unbilled Education performed with patient regarding potential benefit of TDN. Reviewed precautions and risks with patient. Reviewed special precautions/risks over lung fields which include pneumothorax. Reviewed signs and symptoms of pneumothorax and advised pt to go to ER immediately if these symptoms develop advise them of dry needling treatment. Extensive time spent with pt to ensure full understanding of TDN risks. Pt provided verbal consent to treatment. TDN performed to  with 0.25 x 40 single needle placements with local twitch response (LTR). Pistoning technique utilized. Improved pain-free motion following intervention. Bilat upper traps    PATIENT EDUCATION: Education details: goals, POC  Person educated: Patient Education method: Explanation, Demonstration, Tactile cues, Verbal cues, and Handouts Education comprehension: verbalized understanding, returned demonstration, verbal cues required, and tactile cues required  HOME EXERCISE PROGRAM: Access Code: [email or web address] URL:  [email or web address] Date: 07/11/2023 Prepared by: Precious Bard  Exercises - Seated Scapular Retraction  - 1 x daily - 7 x weekly - 2 sets - 10 reps - 5 hold - Neck Sidebending  - 1 x daily - 7 x weekly - 2 sets - 2 reps - 30 hold - Supine Isometric Shoulder Extension with Towel  - 1 x daily - 7 x weekly - 2 sets - 10 reps - 5 hold - Supine Isometric Shoulder Adduction with Towel Roll  - 1 x daily - 7 x weekly - 2 sets - 10 reps - 5 hold  - table slides! 8/13   ASSESSMENT:  CLINICAL IMPRESSION:  Patient's condition has the potential to improve in response to therapy. Maximum improvement is yet to be obtained. The anticipated improvement is attainable and reasonable in a generally predictable time. ROM deferred due to exacerbation. Isometrics added for pain reduction and strengthening.     Pt will continue to benefit from skilled therapy to address remaining deficits in order to improve overall QoL and return to PLOF.      OBJECTIVE IMPAIRMENTS: cardiopulmonary status limiting activity, decreased activity tolerance, decreased mobility, increased fascial restrictions, impaired perceived functional ability, increased muscle spasms, impaired flexibility, impaired  UE functional use, improper body mechanics, postural dysfunction, and pain.   ACTIVITY LIMITATIONS: carrying, lifting, bed mobility, bathing, dressing, self feeding, reach over head, and caring for others  PARTICIPATION LIMITATIONS: meal prep, cleaning, laundry, personal finances, interpersonal relationship, driving, shopping, community activity, yard work, church, and caregiving for husband  PERSONAL FACTORS: Age, Fitness, Past/current experiences, Time since onset of injury/illness/exacerbation, and 3+ comorbidities: aorta type A dissection in sept 2011 with aortic valve replacement bypass of R coronary artery, AAA, anemia, anxiety, CAD, CHF, depression, GERD, Heart failure, HLD, HTN, IBS, Kidney problems, migraine,  osteoarthritis, osteoporosis, RA, ocular migraines, back pain  are also affecting patient's functional outcome.   REHAB POTENTIAL: Fair    CLINICAL DECISION MAKING: Evolving/moderate complexity  EVALUATION COMPLEXITY: Moderate   GOALS: Goals reviewed with patient? Yes  SHORT TERM GOALS: Target date: 08/09/2023  Patient will be independent in home exercise program to improve strength/mobility for better functional independence with ADLs. Baseline:7/23: HEP given 9/16: compliant 1x/week  Goal status: On going     LONG TERM GOALS: Target date: 10/31/2023  Patient will increase FOTO score to equal to or greater than  57%   to demonstrate statistically significant improvement in mobility and quality of life.  Baseline: 7/22: 46 9/16: 50% 10/7: 40%  Goal status: Partially Met  2.  Patient will improve shoulder AROM to > 140 degrees of flexion, scaption, and abduction for improved ability to perform overhead activities. Baseline: 7/22: see above 9/16: see above 10/7 : defer to exacerbation  Goal status: Partially Met   3.  Patient will demonstrate adequate shoulder ROM and strength to be able to shave and dress independently with pain less than 3/10. Baseline: 7/23: 6/10 pain with dressing, limited ROM, unable to test strength 9/16: 3/10 pain with dressing  Goal status: MET  4.  Patient will decrease Quick DASH score by > 8 points demonstrating reduced self-reported upper extremity disability for L and R shoulder  Baseline: 7/23: L shoulder 50% 9/16: R=54.54 % L 66% L 81% R 24%  Goal status: Modified   5.  Patient will be able to eat an entire meal without resting arm demonstrating improved functional strength Baseline: 7/23: has to rest while eating due to pain. 9/16: able to make halfway through meal 10/7: can make it about halfway through meal, some meals can get 3/4s through meal Goal status: Partially Met   PLAN:  PT FREQUENCY: 1x/week  PT DURATION: 8 weeks  PLANNED  INTERVENTIONS: Therapeutic exercises, Therapeutic activity, Neuromuscular re-education, Balance training, Gait training, Patient/Family education, Self Care, Joint mobilization, Vestibular training, Canalith repositioning, Visual/preceptual remediation/compensation, Dry Needling, Electrical stimulation, Spinal mobilization, Cryotherapy, Moist heat, Compression bandaging, scar mobilization, Splintting, Taping, Traction, Ultrasound, Biofeedback, Ionotophoresis 4mg /ml Dexamethasone, Manual therapy, and Re-evaluation  PLAN FOR NEXT SESSION:   Continue with LUE strengthening ,progress as tolerated.    Precious Bard PT  Physical Therapist - [facility]  09/26/23, 12:50 PM

## 2023-09-27 ENCOUNTER — Ambulatory Visit (INDEPENDENT_AMBULATORY_CARE_PROVIDER_SITE_OTHER): Payer: Medicare HMO | Admitting: Psychology

## 2023-09-27 DIAGNOSIS — F331 Major depressive disorder, recurrent, moderate: Secondary | ICD-10-CM

## 2023-09-27 NOTE — Progress Notes (Signed)
[facility] Counselor/Therapist Progress Note  Patient ID: Rachel Torres, MRN: [id number],    Date: 09/27/2023  Time Spent: 10:00am-10:50am   50 minutes   Treatment Type: Individual Therapy  Reported Symptoms: stress  Mental Status Exam: Appearance:  Casual     Behavior: Appropriate  Motor: Normal  Speech/Language:  Normal Rate  Affect: Appropriate  Mood: normal  Thought process: normal  Thought content:   WNL  Sensory/Perceptual disturbances:   WNL  Orientation: oriented to person, place, time/date, and situation  Attention: Good  Concentration: Good  Memory: WNL  Fund of knowledge:  Good  Insight:   Good  Judgment:  Good  Impulse Control: Good   Risk Assessment: Danger to Self:  No Self-injurious Behavior: No Danger to Others: No Duty to Warn:no Physical Aggression / Violence:No  Access to Firearms a concern: No  Gang Involvement:No   Subjective: Pt present for face-to-face individual therapy via video.  Pt consents to telehealth video session and is aware of limitations and benefits of video sessions. Location of pt: home Location of therapist: home office.    Pt talked about her trip to see her grandson.  Pt had a great visit with him.   Pt talked about her health.  She is having a lot of shoulder and arm pain.  She is still going to physical therapy.  Pt has a lot of medical appointments coming up for her and [name].  Pt talked about [name].   His cataract surgery went well.   Addressed pt's frustrations with caregiving. Worked on self care strategies.  Provided supportive therapy.  Interventions: Cognitive Behavioral Therapy and Insight-Oriented  Diagnosis:  F33.1  Plan of Care: Recommend ongoing therapy.   Pt participated in setting treatment goals.   Pt needs a place to talk about stressors.  Plan to meet every two weeks.    Treatment Plan (target date: 03/23/2024) Client Abilities/Strengths  Pt is bright, engaging, and  motivated for therapy.   Client Treatment Preferences  Individual therapy.  Client Statement of Needs  Improve coping skills.  Have a place to talk about stressors.   Symptoms  Depressed or irritable mood. Unresolved grief issues.  Problems Addressed  Unipolar Depression Goals 1. Alleviate depressive symptoms and return to previous level of effective functioning. 2. Appropriately grieve the loss in order to normalize mood and to return to previously adaptive level of functioning. Objective Learn and implement behavioral strategies to overcome depression. Target Date: 2024-03-23 Frequency: Biweekly  Progress: 40 Modality: individual  Related Interventions Engage the client in "behavioral activation," increasing his/her activity level and contact with sources of reward, while identifying processes that inhibit activation.  Use behavioral techniques such as instruction, rehearsal, role-playing, role reversal, as needed, to facilitate activity in the client's daily life; reinforce success. Assist the client in developing skills that increase the likelihood of deriving pleasure from behavioral activation (e.g., assertiveness skills, developing an exercise plan, less internal/more external focus, increased social involvement); reinforce success. Objective Identify important people in life, past and present, and describe the quality, good and poor, of those relationships. Target Date: 2024-03-23 Frequency: Biweekly  Progress: 40 Modality: individual  Related Interventions Conduct Interpersonal Therapy beginning with the assessment of the client's "interpersonal inventory" of important past and present relationships; develop a case formulation linking depression to grief, interpersonal role disputes, role transitions, and/or interpersonal deficits). Objective Learn and implement problem-solving and decision-making skills. Target Date: 2024-03-23 Frequency: Biweekly  Progress: 40 Modality:  individual  Related Interventions Conduct Problem-Solving Therapy using techniques such as psychoeducation, modeling, and role-playing to teach client problem-solving skills (i.e., defining a problem specifically, generating possible solutions, evaluating the pros and cons of each solution, selecting and implementing a plan of action, evaluating the efficacy of the plan, accepting or revising the plan); role-play application of the problem-solving skill to a real life issue. Encourage in the client the development of a positive problem orientation in which problems and solving them are viewed as a natural part of life and not something to be feared, despaired, or avoided. 3. Develop healthy interpersonal relationships that lead to the alleviation and help prevent the relapse of depression. 4. Develop healthy thinking patterns and beliefs about self, others, and the world that lead to the alleviation and help prevent the relapse of depression. 5. Recognize, accept, and cope with feelings of depression. Diagnosis F33.1  Conditions For Discharge Achievement of treatment goals and objectives   Salomon Fick, LCSW

## 2023-09-28 DIAGNOSIS — M5416 Radiculopathy, lumbar region: Secondary | ICD-10-CM | POA: Diagnosis not present

## 2023-09-28 DIAGNOSIS — Z6835 Body mass index (BMI) 35.0-35.9, adult: Secondary | ICD-10-CM | POA: Diagnosis not present

## 2023-09-29 NOTE — Therapy (Signed)
OUTPATIENT PHYSICAL THERAPY TREATMENT/ RE-EVAL   Patient Name: Rachel Torres MRN: [id number] DOB:[birth year], 67 y.o., female Today's Date: 10/03/2023  END OF SESSION:  PT End of Session - 10/03/23 1134     Visit Number 11    Number of Visits 27    Date for PT Re-Evaluation 11/28/23    Authorization Type [employer]  Medicare HMO    Authorization Time Period 09/05/23-10/31/23    Progress Note Due on Visit 10    PT Start Time 1140    PT Stop Time 1228    PT Time Calculation (min) 48 min    Activity Tolerance Patient tolerated treatment well    Behavior During Therapy WFL for tasks assessed/performed                  Past Medical History:  Diagnosis Date   AAA (abdominal aortic aneurysm) (HCC)    a. Chronic w/o evidence of aneurysmal dil on CTA 01/2016.   Alcohol abuse    Allergy    Anemia    Anxiety    Asthma    CAD (coronary artery disease)    a. 08/2010 s/p CABG x 1 (VG->RCA) @ time of Ao dissection repair; b. 01/2016 Lexiscan MV: mid antsept/apical defect w/ ? peri-infarct ischemia-->likely attenuation-->Med Rx.   Chewing difficulty    Chronic combined systolic (congestive) and diastolic (congestive) heart failure (HCC)    a. 2012 EF 30-35%; b. 04/2015 EF 40-45%; c. 01/2016 Echo: Ef 55-65%, nl AoV bioprosthesis, nl RV, nl PASP.   Constipation    Depression    Edema of both lower extremities    Gallbladder sludge    GERD (gastroesophageal reflux disease)    Heart failure (HCC)    Heart valve problem    History of Bicuspid Aortic Valve    a. 08/2010 s/p AVR @ time of Ao dissection repair; b. 01/2016 Echo: Ef 55-65%, nl AoV bioprosthesis, nl RV, nl PASP.   Hx of repair of dissecting thoracic aortic aneurysm, Stanford type A    a. 08/2010 s/p repair with AVR and VG->RCA; b. 01/2016 CTA: stable appearance of Asc Thoracic Aortic graft. Opacification of flase lumen of chronic abd Ao dissection w/ retrograde flow through lumbar arteries. No aneurysmal dil of flase lumen.    Hyperlipidemia    Hypertension    Hypertensive heart disease    IBS (irritable bowel syndrome)    Internal hemorrhoids    Kidney problem    Marfan syndrome    pt denies   Migraine    Obstructive sleep apnea    Osteoarthritis    Osteoporosis    Palpitations    Pneumonia    RA (rheumatoid arthritis) (HCC)    a. Followed by Dr. Mallie Mussel   Past Surgical History:  Procedure Laterality Date   ABDOMINAL AORTIC ANEURYSM REPAIR     CARDIAC CATHETERIZATION  2001   CESAREAN SECTION  1986 & 1991   CHOLECYSTECTOMY     COLONOSCOPY     ESOPHAGOGASTRODUODENOSCOPY (EGD) WITH PROPOFOL N/A 05/08/2020   Procedure: ESOPHAGOGASTRODUODENOSCOPY (EGD) WITH PROPOFOL;  Surgeon: Pasty Spillers, MD;  Location: [facility] ENDOSCOPY;  Service: Endoscopy;  Laterality: N/A;   FRACTURE SURGERY Right    shoulder replacement   HEMORRHOID BANDING     ORIF DISTAL RADIUS FRACTURE Left    UPPER GASTROINTESTINAL ENDOSCOPY     Patient Active Problem List   Diagnosis Date Noted   Multiple closed tarsal fractures of left foot, sequela 07/12/2023   Arm contusion, right, sequela  07/12/2023   Lumbar disc disease 07/12/2023   Atrial fibrillation (HCC) 06/09/2023   Paresthesia 06/03/2023   BMI 34.0-34.9,adult 02/17/2023   Obesity, Beginning BMI 35.45 02/17/2023   Tinnitus 01/14/2023   Change in vision 01/14/2023   Colon cancer screening 01/14/2023   Personal history of fall 01/14/2023   Problem related to unspecified psychosocial circumstances 12/28/2022   Person encountering health services to consult on behalf of another person 12/28/2022   Palpitation 12/10/2022   Fatigue 10/26/2022   B12 deficiency 10/26/2022   Right shoulder pain 09/15/2022   Depression 09/14/2022   Class 2 severe obesity with serious comorbidity and body mass index (BMI) of 35.0 to 35.9 in adult [facility]) 09/14/2022   Other constipation 05/12/2022   Medicare annual wellness visit, initial 07/17/2021   TMJ (dislocation of temporomandibular  joint) 07/17/2021   Encounter for screening mammogram for breast cancer 06/05/2021   Diarrhea 04/11/2020   Epigastric pain 02/19/2020   Dark stools 02/19/2020   History of atrial fibrillation 02/05/2019   Shortness of breath 01/27/2019   Irregular surface of cornea 12/25/2018   HPV (human papilloma virus) infection 12/01/2016   Screening mammogram, encounter for 11/29/2016   Estrogen deficiency 11/29/2016   Encounter for routine gynecological examination 11/29/2016   Grade III hemorrhoids 10/06/2016   Tachycardia 08/14/2016   Hemorrhoids 08/13/2016   Atypical migraine 08/03/2016   CAD (coronary artery disease)    AAA (abdominal aortic aneurysm) (HCC)    Chronic combined systolic (congestive) and diastolic (congestive) heart failure (HCC)    Prediabetes 02/11/2016   Generalized anxiety disorder 12/08/2015   Panic attacks 07/28/2015   Chest pain 07/09/2013   Sensorineural hearing loss 03/27/2013   Vitamin D deficiency 07/03/2012   H/O aortic dissection 05/12/2012   Routine general medical examination at a health care facility 09/20/2011   Atypical chest pain 09/15/2011   S/P AVR (aortic valve replacement) 01/07/2011   CORONARY ARTERY BYPASS GRAFT, HX OF 01/07/2011   Arthritis 12/20/2010   Back pain 12/15/2010   H/O dissecting abdominal aortic aneurysm repair 08/24/2010   IRRITABLE BOWEL SYNDROME 09/09/2009   Obstructive sleep apnea 08/04/2009   External hemorrhoid 06/26/2009   Osteoporosis 01/09/2009   Hyperlipidemia 07/26/2007   Depression with anxiety 07/26/2007   Essential hypertension 07/26/2007   Allergic rhinitis 07/26/2007   Asthma 07/26/2007   GERD 07/26/2007   Osteoarthritis 07/26/2007   Ocular migraine 07/26/2007   Mild intermittent asthma without complication 07/26/2007    PCP: [name] MD   REFERRING PROVIDER: Stormy Card  REFERRING DIAG: L rotator cuff   THERAPY DIAG:  Left shoulder pain, unspecified chronicity  Stiffness of left  shoulder, not elsewhere classified  Abnormal posture  Muscle weakness (generalized)  Other low back pain  Rationale for Evaluation and Treatment: Rehabilitation  ONSET DATE: Feb 2024  SUBJECTIVE:  SUBJECTIVE STATEMENT:  Patient has new order for radiculopathy lumbar region to add to POC  Hand dominance: Left  PERTINENT HISTORY: Doral Njoku is a 66yoF familiar to our services. Pt referred to PT for strengthening of chronic left shoulder DJD, known remote RC injury. Recently fell and broke four metatarsals, since cleared from use of CAM rocker. She was treated successfully in past for right shoulder pain and has past medical history of Right shoulder surgery replacement. 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 PMH aorta type A dissection in sept 2011 s/p aortic valve replacement, bypass of R coronary artery, anemia, anxiety, CAD, CHF, depression, GERD, Heart failure, HLD, HTN, IBS, migraine, osteoarthritis, osteoporosis, RA, back pain.    Back pain:  Patient has new referral for radiculopathy, lumbar region. MRI showed degeneration of L1-2 and L2-3 with some disc space collapse and minor retrolisthesis of L2-3. Issue began in February, felt numbness in L thigh. No cause known, but she was at a museum and noticed it was numb. Sometimes L foot numbness and toe numbness of 2nd and 3rd toe causing tripping. This caused her to trip July 4th and break five metatarsals. Some minor pain in the low pain noted. Pain noted to be 2-3/10 Pain in hip 3-4/10 of L hip.    PAIN:  Are you having pain? Yes, 3/10 pain in lateral deltoids in the shoulder.  PRECAUTIONS: None  FALLS:  Has patient fallen in last 6 months? Yes. Number of falls 2  PATIENT GOALS:to lift more than 3lb with LUE, be able to eat a meal without  arm being tired.    OBJECTIVE:  DIAGNOSTIC FINDINGS:  IMPRESSION: Mild degenerative change in the cervical spine without acute fracture or traumatic subluxation.   IMPRESSION: Full width, full width tears of the supraspinatus and infraspinatus tendons with retraction to the glenoid. Grade 2/3 infraspinatus atrophy.   Moderate tendinosis of the distal subscapularis tendon with articular sided fraying of the cephalad fibers.   Moderate intra-articular long head biceps tendinosis with fraying and possible partial tearing.   Mild glenohumeral and moderate AC joint osteoarthritis.   Degenerative superior labral tearing anteriorly and posteriorly.   PATIENT SURVEYS:  Quick Dash 50%  and FOTO 46%   COGNITION: Overall cognitive status: Within functional limits for tasks assessed                                  SENSATION: WFL   POSTURE: Slight rounded shoulders and shoulder elevation    UPPER EXTREMITY ROM:    Active ROM Right eval 9/16: recert RUE Left eval 9/16 RECERT LUE  Shoulder flexion 64 122 91 92  Shoulder extension        Shoulder abduction 95 88 52 82  Shoulder adduction        Shoulder internal rotation Unable to test at 90 61 degrees 5 40  Shoulder external rotation 18at side of body -5 degrees at 90 degrees 34 35  Elbow flexion [facility]  WFL   Elbow extension [facility]  WFL   Wrist flexion        Wrist extension        Wrist ulnar deviation        Wrist radial deviation        Wrist pronation        Wrist supination        (Blank rows = not tested)   UPPER EXTREMITY MMT:  MMT Right eval Left eval  Shoulder flexion      Shoulder extension      Shoulder abduction      Shoulder adduction      Shoulder internal rotation      Shoulder external rotation      Middle trapezius      Lower trapezius      Elbow flexion      Elbow extension      Wrist flexion      Wrist extension      Wrist ulnar deviation      Wrist radial deviation      Wrist pronation       Wrist supination      Grip strength (lbs) 30 35  (Blank rows = not tested) Unable to test strength due to pain with motion   TRUNK ROM: Trunk Flexion 65  Trunk Extension 7  Trunk R SB Limited 25%  Trunk L SB WFL  Trunk R rotation WFL  Trunk L rotation Limited 25%    Ankle ROM Seated: Motion: AROM seated Right Left  Plantarflexion 4+ 3  Dorsiflexion 4+ 4  Inversion 4 3+  Eversion 4+ 3+    SHOULDER SPECIAL TESTS: Impingement tests:  unable to test due to pain in starting position SLAP lesions:  unable to test due to pain in starting position Instability tests:  unable to test due to pain in starting position Rotator cuff assessment:  unable to test due to pain in starting position Biceps assessment:  unable to test due to pain in starting position   LOW BACK SPECIAL TEST: SLR: negative  Scour test: negative FABER: negative Pelvic compression/distraction: negative  Slump test positive LLE  6 min walk test: 1160 ft increased left foot drag and antalgic gait pattern with foot eversion    JOINT MOBILITY TESTING:  AP: painful PA: painful Inferior: painful Distraction: reduces pain   Back mobilization hypomobile and painful to grade II mobilizations    PALPATION:  Tenderness to all joint regions of GH     TODAY'S TREATMENT: DATE: 10/03/23  Back assessment see above for info   Distraction inferior glide 3x30 seconds: decreased pain Lateral distraction L hip 3x30 seconds: decreased pain  PATIENT EDUCATION: Education details: goals, POC  Person educated: Patient Education method: Explanation, Demonstration, Tactile cues, Verbal cues, and Handouts Education comprehension: verbalized understanding, returned demonstration, verbal cues required, and tactile cues required  HOME EXERCISE PROGRAM:  Access Code: [email or web address] URL: [email or web address] Date: 10/03/2023 Prepared by: Precious Bard  Exercises - Seated Scapular Retraction  - 1 x daily - 7 x  weekly - 2 sets - 10 reps - 5 hold - Neck Sidebending  - 1 x daily - 7 x weekly - 2 sets - 2 reps - 30 hold - Supine Isometric Shoulder Extension with Towel  - 1 x daily - 7 x weekly - 2 sets - 10 reps - 5 hold - Supine Isometric Shoulder Adduction with Towel Roll  - 1 x daily - 7 x weekly - 2 sets - 10 reps - 5 hold - Seated Bilateral Shoulder Flexion Towel Slide at Table Top  - 1 x daily - 7 x weekly - 1 sets - 3 reps - 30 hold - Supine Posterior Pelvic Tilt  - 1 x daily - 7 x weekly - 2 sets - 5 reps - 30 hold - Supine LE Neural Mobilization  - 1 x daily - 7 x weekly - 2 sets - 10 reps -  5 hold - Seated Ankle Alphabet  - 1 x daily - 7 x weekly - 2 sets - 1 reps - 5 hold  ASSESSMENT:  CLINICAL IMPRESSION:  New order for low back added to POC for low back. Patient has radiating pain/numbness down LLE with deficits in foot noted with decreased body mechanics with prolonged ambulation. New goal to stand for 10 minutes without numbness added to POC. Patient is able to ambulate community distance however is not yet at age norm. She has decreased L ankle strength and will benefit from strengthening and pain reduction.     Pt will continue to benefit from skilled therapy to address remaining deficits in order to improve overall QoL and return to PLOF.      OBJECTIVE IMPAIRMENTS: cardiopulmonary status limiting activity, decreased activity tolerance, decreased mobility, increased fascial restrictions, impaired perceived functional ability, increased muscle spasms, impaired flexibility, impaired UE functional use, improper body mechanics, postural dysfunction, and pain.   ACTIVITY LIMITATIONS: carrying, lifting, bed mobility, bathing, dressing, self feeding, reach over head, and caring for others  PARTICIPATION LIMITATIONS: meal prep, cleaning, laundry, personal finances, interpersonal relationship, driving, shopping, community activity, yard work, church, and caregiving for husband  PERSONAL  FACTORS: Age, Fitness, Past/current experiences, Time since onset of injury/illness/exacerbation, and 3+ comorbidities: aorta type A dissection in sept 2011 with aortic valve replacement bypass of R coronary artery, AAA, anemia, anxiety, CAD, CHF, depression, GERD, Heart failure, HLD, HTN, IBS, Kidney problems, migraine, osteoarthritis, osteoporosis, RA, ocular migraines, back pain  are also affecting patient's functional outcome.   REHAB POTENTIAL: Fair    CLINICAL DECISION MAKING: Evolving/moderate complexity  EVALUATION COMPLEXITY: Moderate   GOALS: Goals reviewed with patient? Yes  SHORT TERM GOALS: Target date: 08/09/2023  Patient will be independent in home exercise program to improve strength/mobility for better functional independence with ADLs. Baseline:7/23: HEP given 9/16: compliant 1x/week  Goal status: On going     LONG TERM GOALS: Target date: 11/28/2023    Patient will increase FOTO score to equal to or greater than  57%   to demonstrate statistically significant improvement in mobility and quality of life.  Baseline: 7/22: 46 9/16: 50% 10/7: 40%  Goal status: Partially Met  2.  Patient will improve shoulder AROM to > 140 degrees of flexion, scaption, and abduction for improved ability to perform overhead activities. Baseline: 7/22: see above 9/16: see above 10/7 : defer to exacerbation  Goal status: Partially Met   3.  Patient will demonstrate adequate shoulder ROM and strength to be able to shave and dress independently with pain less than 3/10. Baseline: 7/23: 6/10 pain with dressing, limited ROM, unable to test strength 9/16: 3/10 pain with dressing  Goal status: MET  4.  Patient will decrease Quick DASH score by > 8 points demonstrating reduced self-reported upper extremity disability for L and R shoulder  Baseline: 7/23: L shoulder 50% 9/16: R=54.54 % L 66% L 81% R 24%  Goal status: Modified   5.  Patient will be able to eat an entire meal without resting  arm demonstrating improved functional strength Baseline: 7/23: has to rest while eating due to pain. 9/16: able to make halfway through meal 10/7: can make it about halfway through meal, some meals can get 3/4s through meal Goal status: Partially Met  6.  Patient will be able to stand 15 minutes without her LLE going numb allowing for ADLS such as showering and iADLs such as cooking Baseline: 5 minutes  Goal status: NEW  7. Patient will increase six minute walk test distance to >1500 for progression to community ambulator and improve gait ability Baseline: 1160 ft   Goal status: NEW  PLAN:  PT FREQUENCY: 1x/week  PT DURATION: 8 weeks  PLANNED INTERVENTIONS: Therapeutic exercises, Therapeutic activity, Neuromuscular re-education, Balance training, Gait training, Patient/Family education, Self Care, Joint mobilization, Vestibular training, Canalith repositioning, Visual/preceptual remediation/compensation, Dry Needling, Electrical stimulation, Spinal mobilization, Cryotherapy, Moist heat, Compression bandaging, scar mobilization, Splintting, Taping, Traction, Ultrasound, Biofeedback, Ionotophoresis 4mg /ml Dexamethasone, Manual therapy, and Re-evaluation  PLAN FOR NEXT SESSION:   Continue with LUE strengthening  L ankle strengthening, distraction, core activation    Precious Bard PT  Physical Therapist - [facility]  [facility]  10/03/23, 2:16 PM

## 2023-10-03 ENCOUNTER — Ambulatory Visit (INDEPENDENT_AMBULATORY_CARE_PROVIDER_SITE_OTHER): Payer: Medicare HMO | Admitting: Psychology

## 2023-10-03 ENCOUNTER — Ambulatory Visit: Payer: Medicare HMO

## 2023-10-03 DIAGNOSIS — R293 Abnormal posture: Secondary | ICD-10-CM

## 2023-10-03 DIAGNOSIS — M5459 Other low back pain: Secondary | ICD-10-CM

## 2023-10-03 DIAGNOSIS — M25512 Pain in left shoulder: Secondary | ICD-10-CM

## 2023-10-03 DIAGNOSIS — G8929 Other chronic pain: Secondary | ICD-10-CM | POA: Diagnosis not present

## 2023-10-03 DIAGNOSIS — M6281 Muscle weakness (generalized): Secondary | ICD-10-CM

## 2023-10-03 DIAGNOSIS — F331 Major depressive disorder, recurrent, moderate: Secondary | ICD-10-CM | POA: Diagnosis not present

## 2023-10-03 DIAGNOSIS — M25612 Stiffness of left shoulder, not elsewhere classified: Secondary | ICD-10-CM

## 2023-10-03 DIAGNOSIS — M25511 Pain in right shoulder: Secondary | ICD-10-CM | POA: Diagnosis not present

## 2023-10-03 NOTE — Progress Notes (Signed)
Behavioral Health Counselor/Therapist Progress Note  Patient ID: Rachel Torres, MRN: [id number],    Date: 10/03/2023  Time Spent: 10:00am-10:50am   50 minutes   Treatment Type: Individual Therapy  Reported Symptoms: stress  Mental Status Exam: Appearance:  Casual     Behavior: Appropriate  Motor: Normal  Speech/Language:  Normal Rate  Affect: Appropriate  Mood: normal  Thought process: normal  Thought content:   WNL  Sensory/Perceptual disturbances:   WNL  Orientation: oriented to person, place, time/date, and situation  Attention: Good  Concentration: Good  Memory: WNL  Fund of knowledge:  Good  Insight:   Good  Judgment:  Good  Impulse Control: Good   Risk Assessment: Danger to Self:  No Self-injurious Behavior: No Danger to Others: No Duty to Warn:no Physical Aggression / Violence:No  Access to Firearms a concern: No  Gang Involvement:No   Subjective: Pt present for face-to-face individual therapy via video.  Pt consents to telehealth video session and is aware of limitations and benefits of video sessions. Location of pt: home Location of therapist: home office.    Pt talked about her health.  She saw the doctor about her back and there is nothing anatomically wrong other than arthritis.  She was prescribed physical therapy for her back.  Pt talked about [name].   His cognitive deficits are getting worse.  Addressed how this impacts pt.   Addressed pt's frustrations with caregiving. Worked on self care strategies.  Provided supportive therapy.  Interventions: Cognitive Behavioral Therapy and Insight-Oriented  Diagnosis:  F33.1  Plan of Care: Recommend ongoing therapy.   Pt participated in setting treatment goals.   Pt needs a place to talk about stressors.  Plan to meet every two weeks.    Treatment Plan (target date: 03/23/2024) Client Abilities/Strengths  Pt is bright, engaging, and motivated for therapy.   Client Treatment  Preferences  Individual therapy.  Client Statement of Needs  Improve coping skills.  Have a place to talk about stressors.   Symptoms  Depressed or irritable mood. Unresolved grief issues.  Problems Addressed  Unipolar Depression Goals 1. Alleviate depressive symptoms and return to previous level of effective functioning. 2. Appropriately grieve the loss in order to normalize mood and to return to previously adaptive level of functioning. Objective Learn and implement behavioral strategies to overcome depression. Target Date: 2024-03-23 Frequency: Biweekly  Progress: 40 Modality: individual  Related Interventions Engage the client in "behavioral activation," increasing his/her activity level and contact with sources of reward, while identifying processes that inhibit activation.  Use behavioral techniques such as instruction, rehearsal, role-playing, role reversal, as needed, to facilitate activity in the client's daily life; reinforce success. Assist the client in developing skills that increase the likelihood of deriving pleasure from behavioral activation (e.g., assertiveness skills, developing an exercise plan, less internal/more external focus, increased social involvement); reinforce success. Objective Identify important people in life, past and present, and describe the quality, good and poor, of those relationships. Target Date: 2024-03-23 Frequency: Biweekly  Progress: 40 Modality: individual  Related Interventions Conduct Interpersonal Therapy beginning with the assessment of the client's "interpersonal inventory" of important past and present relationships; develop a case formulation linking depression to grief, interpersonal role disputes, role transitions, and/or interpersonal deficits). Objective Learn and implement problem-solving and decision-making skills. Target Date: 2024-03-23 Frequency: Biweekly  Progress: 40 Modality: individual  Related Interventions Conduct  Problem-Solving Therapy using techniques such as psychoeducation, modeling, and role-playing to teach client problem-solving  skills (i.e., defining a problem specifically, generating possible solutions, evaluating the pros and cons of each solution, selecting and implementing a plan of action, evaluating the efficacy of the plan, accepting or revising the plan); role-play application of the problem-solving skill to a real life issue. Encourage in the client the development of a positive problem orientation in which problems and solving them are viewed as a natural part of life and not something to be feared, despaired, or avoided. 3. Develop healthy interpersonal relationships that lead to the alleviation and help prevent the relapse of depression. 4. Develop healthy thinking patterns and beliefs about self, others, and the world that lead to the alleviation and help prevent the relapse of depression. 5. Recognize, accept, and cope with feelings of depression. Diagnosis F33.1  Conditions For Discharge Achievement of treatment goals and objectives   Salomon Fick, LCSW

## 2023-10-05 ENCOUNTER — Encounter: Payer: Medicare HMO | Admitting: Physical Therapy

## 2023-10-06 NOTE — Therapy (Signed)
OUTPATIENT PHYSICAL THERAPY TREATMENT   Patient Name: Rachel Torres MRN: [id number] DOB:1956/09/17, 67 y.o., female Today's Date: 10/11/2023  END OF SESSION:  PT End of Session - 10/10/23 1548     Visit Number 12    Number of Visits 27    Date for PT Re-Evaluation 11/28/23    Authorization Type [employer]  Medicare HMO    Authorization Time Period 09/05/23-10/31/23    Progress Note Due on Visit 10    PT Start Time 1615    PT Stop Time 1659    PT Time Calculation (min) 44 min    Activity Tolerance Patient tolerated treatment well    Behavior During Therapy WFL for tasks assessed/performed                   Past Medical History:  Diagnosis Date   AAA (abdominal aortic aneurysm) (HCC)    a. Chronic w/o evidence of aneurysmal dil on CTA 01/2016.   Alcohol abuse    Allergy    Anemia    Anxiety    Asthma    CAD (coronary artery disease)    a. 08/2010 s/p CABG x 1 (VG->RCA) @ time of Ao dissection repair; b. 01/2016 Lexiscan MV: mid antsept/apical defect w/ ? peri-infarct ischemia-->likely attenuation-->Med Rx.   Chewing difficulty    Chronic combined systolic (congestive) and diastolic (congestive) heart failure (HCC)    a. 2012 EF 30-35%; b. 04/2015 EF 40-45%; c. 01/2016 Echo: Ef 55-65%, nl AoV bioprosthesis, nl RV, nl PASP.   Constipation    Depression    Edema of both lower extremities    Gallbladder sludge    GERD (gastroesophageal reflux disease)    Heart failure (HCC)    Heart valve problem    History of Bicuspid Aortic Valve    a. 08/2010 s/p AVR @ time of Ao dissection repair; b. 01/2016 Echo: Ef 55-65%, nl AoV bioprosthesis, nl RV, nl PASP.   Hx of repair of dissecting thoracic aortic aneurysm, Stanford type A    a. 08/2010 s/p repair with AVR and VG->RCA; b. 01/2016 CTA: stable appearance of Asc Thoracic Aortic graft. Opacification of flase lumen of chronic abd Ao dissection w/ retrograde flow through lumbar arteries. No aneurysmal dil of flase lumen.    Hyperlipidemia    Hypertension    Hypertensive heart disease    IBS (irritable bowel syndrome)    Internal hemorrhoids    Kidney problem    Marfan syndrome    pt denies   Migraine    Obstructive sleep apnea    Osteoarthritis    Osteoporosis    Palpitations    Pneumonia    RA (rheumatoid arthritis) (HCC)    a. Followed by Dr. Mallie Mussel   Past Surgical History:  Procedure Laterality Date   ABDOMINAL AORTIC ANEURYSM REPAIR     CARDIAC CATHETERIZATION  2001   CESAREAN SECTION  1986 & 1991   CHOLECYSTECTOMY     COLONOSCOPY     ESOPHAGOGASTRODUODENOSCOPY (EGD) WITH PROPOFOL N/A 05/08/2020   Procedure: ESOPHAGOGASTRODUODENOSCOPY (EGD) WITH PROPOFOL;  Surgeon: Pasty Spillers, MD;  Location: [facility] ENDOSCOPY;  Service: Endoscopy;  Laterality: N/A;   FRACTURE SURGERY Right    shoulder replacement   HEMORRHOID BANDING     ORIF DISTAL RADIUS FRACTURE Left    UPPER GASTROINTESTINAL ENDOSCOPY     Patient Active Problem List   Diagnosis Date Noted   Multiple closed tarsal fractures of left foot, sequela 07/12/2023   Arm contusion, right, sequela  07/12/2023   Lumbar disc disease 07/12/2023   Atrial fibrillation (HCC) 06/09/2023   Paresthesia 06/03/2023   BMI 34.0-34.9,adult 02/17/2023   Obesity, Beginning BMI 35.45 02/17/2023   Tinnitus 01/14/2023   Change in vision 01/14/2023   Colon cancer screening 01/14/2023   Personal history of fall 01/14/2023   Problem related to unspecified psychosocial circumstances 12/28/2022   Person encountering health services to consult on behalf of another person 12/28/2022   Palpitation 12/10/2022   Fatigue 10/26/2022   B12 deficiency 10/26/2022   Right shoulder pain 09/15/2022   Depression 09/14/2022   Class 2 severe obesity with serious comorbidity and body mass index (BMI) of 35.0 to 35.9 in adult [facility]) 09/14/2022   Other constipation 05/12/2022   Medicare annual wellness visit, initial 07/17/2021   TMJ (dislocation of temporomandibular  joint) 07/17/2021   Encounter for screening mammogram for breast cancer 06/05/2021   Diarrhea 04/11/2020   Epigastric pain 02/19/2020   Dark stools 02/19/2020   History of atrial fibrillation 02/05/2019   Shortness of breath 01/27/2019   Irregular surface of cornea 12/25/2018   HPV (human papilloma virus) infection 12/01/2016   Screening mammogram, encounter for 11/29/2016   Estrogen deficiency 11/29/2016   Encounter for routine gynecological examination 11/29/2016   Grade III hemorrhoids 10/06/2016   Tachycardia 08/14/2016   Hemorrhoids 08/13/2016   Atypical migraine 08/03/2016   CAD (coronary artery disease)    AAA (abdominal aortic aneurysm) (HCC)    Chronic combined systolic (congestive) and diastolic (congestive) heart failure (HCC)    Prediabetes 02/11/2016   Generalized anxiety disorder 12/08/2015   Panic attacks 07/28/2015   Chest pain 07/09/2013   Sensorineural hearing loss 03/27/2013   Vitamin D deficiency 07/03/2012   H/O aortic dissection 05/12/2012   Routine general medical examination at a health care facility 09/20/2011   Atypical chest pain 09/15/2011   S/P AVR (aortic valve replacement) 01/07/2011   CORONARY ARTERY BYPASS GRAFT, HX OF 01/07/2011   Arthritis 12/20/2010   Back pain 12/15/2010   H/O dissecting abdominal aortic aneurysm repair 08/24/2010   IRRITABLE BOWEL SYNDROME 09/09/2009   Obstructive sleep apnea 08/04/2009   External hemorrhoid 06/26/2009   Osteoporosis 01/09/2009   Hyperlipidemia 07/26/2007   Depression with anxiety 07/26/2007   Essential hypertension 07/26/2007   Allergic rhinitis 07/26/2007   Asthma 07/26/2007   GERD 07/26/2007   Osteoarthritis 07/26/2007   Ocular migraine 07/26/2007   Mild intermittent asthma without complication 07/26/2007    PCP: [name] MD   REFERRING PROVIDER: Stormy Card  REFERRING DIAG: L rotator cuff   THERAPY DIAG:  Left shoulder pain, unspecified chronicity  Stiffness of left  shoulder, not elsewhere classified  Abnormal posture  Muscle weakness (generalized)  Rationale for Evaluation and Treatment: Rehabilitation  ONSET DATE: Feb 2024  SUBJECTIVE:  SUBJECTIVE STATEMENT:  Patient is highly motivated, reports ankle pain with exercises.  Hand dominance: Left  PERTINENT HISTORY: Rachel Torres is a 66yoF familiar to our services. Pt referred to PT for strengthening of chronic left shoulder DJD, known remote RC injury. Recently fell and broke four metatarsals, since cleared from use of CAM rocker. She was treated successfully in past for right shoulder pain and has past medical history of Right shoulder surgery replacement. 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 PMH aorta type A dissection in sept 2011 s/p aortic valve replacement, bypass of R coronary artery, anemia, anxiety, CAD, CHF, depression, GERD, Heart failure, HLD, HTN, IBS, migraine, osteoarthritis, osteoporosis, RA, back pain.    Back pain:  Patient has new referral for radiculopathy, lumbar region. MRI showed degeneration of L1-2 and L2-3 with some disc space collapse and minor retrolisthesis of L2-3. Issue began in February, felt numbness in L thigh. No cause known, but she was at a museum and noticed it was numb. Sometimes L foot numbness and toe numbness of 2nd and 3rd toe causing tripping. This caused her to trip July 4th and break five metatarsals. Some minor pain in the low pain noted. Pain noted to be 2-3/10 Pain in hip 3-4/10 of L hip.    PAIN:  Are you having pain? Yes, 3/10 pain in lateral deltoids in the shoulder.  PRECAUTIONS: None  FALLS:  Has patient fallen in last 6 months? Yes. Number of falls 2  PATIENT GOALS:to lift more than 3lb with LUE, be able to eat a meal without arm being tired.     OBJECTIVE:  DIAGNOSTIC FINDINGS:  IMPRESSION: Mild degenerative change in the cervical spine without acute fracture or traumatic subluxation.   IMPRESSION: Full width, full width tears of the supraspinatus and infraspinatus tendons with retraction to the glenoid. Grade 2/3 infraspinatus atrophy.   Moderate tendinosis of the distal subscapularis tendon with articular sided fraying of the cephalad fibers.   Moderate intra-articular long head biceps tendinosis with fraying and possible partial tearing.   Mild glenohumeral and moderate AC joint osteoarthritis.   Degenerative superior labral tearing anteriorly and posteriorly.   PATIENT SURVEYS:  Quick Dash 50%  and FOTO 46%   COGNITION: Overall cognitive status: Within functional limits for tasks assessed                                  SENSATION: WFL   POSTURE: Slight rounded shoulders and shoulder elevation    UPPER EXTREMITY ROM:    Active ROM Right eval 9/16: recert RUE Left eval 9/16 RECERT LUE  Shoulder flexion 64 122 91 92  Shoulder extension        Shoulder abduction 95 88 52 82  Shoulder adduction        Shoulder internal rotation Unable to test at 90 61 degrees 5 40  Shoulder external rotation 18at side of body -5 degrees at 90 degrees 34 35  Elbow flexion [facility]  WFL   Elbow extension [facility]  WFL   Wrist flexion        Wrist extension        Wrist ulnar deviation        Wrist radial deviation        Wrist pronation        Wrist supination        (Blank rows = not tested)   UPPER EXTREMITY MMT:   MMT Right  eval Left eval  Shoulder flexion      Shoulder extension      Shoulder abduction      Shoulder adduction      Shoulder internal rotation      Shoulder external rotation      Middle trapezius      Lower trapezius      Elbow flexion      Elbow extension      Wrist flexion      Wrist extension      Wrist ulnar deviation      Wrist radial deviation      Wrist pronation      Wrist  supination      Grip strength (lbs) 30 35  (Blank rows = not tested) Unable to test strength due to pain with motion   TRUNK ROM: Trunk Flexion 65  Trunk Extension 7  Trunk R SB Limited 25%  Trunk L SB WFL  Trunk R rotation WFL  Trunk L rotation Limited 25%    Ankle ROM Seated: Motion: AROM seated Right Left  Plantarflexion 4+ 3  Dorsiflexion 4+ 4  Inversion 4 3+  Eversion 4+ 3+    SHOULDER SPECIAL TESTS: Impingement tests:  unable to test due to pain in starting position SLAP lesions:  unable to test due to pain in starting position Instability tests:  unable to test due to pain in starting position Rotator cuff assessment:  unable to test due to pain in starting position Biceps assessment:  unable to test due to pain in starting position   LOW BACK SPECIAL TEST: SLR: negative  Scour test: negative FABER: negative Pelvic compression/distraction: negative  Slump test positive LLE  6 min walk test: 1160 ft increased left foot drag and antalgic gait pattern with foot eversion    JOINT MOBILITY TESTING:  AP: painful PA: painful Inferior: painful Distraction: reduces pain   Back mobilization hypomobile and painful to grade II mobilizations    PALPATION:  Tenderness to all joint regions of GH     TODAY'S TREATMENT: DATE: 10/11/23  TherEx:  Sciatic nerve glide LLE 20s with leg on PT shoulder Swiss ball TrA 10x 3 second holds Swiss ball TrA activation 10x each UE raise  4 way ankle RTB 10x each LE RTB PF added to HEP   Manual:  Distraction inferior glide 3x30 seconds: decreased pain Lateral distraction L hip 3x30 seconds: decreased pain Grade II mobilization 3 minutes thoracic spine  Trigger Point Dry Needling (TDN), unbilled Education performed with patient regarding potential benefit of TDN. Reviewed precautions and risks with patient. Reviewed special precautions/risks over lung fields which include pneumothorax. Reviewed signs and symptoms of pneumothorax  and advised pt to go to ER immediately if these symptoms develop advise them of dry needling treatment. Extensive time spent with pt to ensure full understanding of TDN risks. Pt provided verbal consent to treatment. TDN performed to  with 0.25 x 40 single needle placements with local twitch response (LTR). Pistoning technique utilized. Improved pain-free motion following intervention. L gastroc, hamstring, piriformis, bilateral upper trap.       PATIENT EDUCATION: Education details: goals, POC  Person educated: Patient Education method: Explanation, Demonstration, Tactile cues, Verbal cues, and Handouts Education comprehension: verbalized understanding, returned demonstration, verbal cues required, and tactile cues required  HOME EXERCISE PROGRAM:  Access Code: [email or web address] URL: [email or web address] Date: 10/03/2023 Prepared by: Precious Bard  Exercises - Seated Scapular Retraction  - 1 x daily - 7 x weekly - 2 sets -  10 reps - 5 hold - Neck Sidebending  - 1 x daily - 7 x weekly - 2 sets - 2 reps - 30 hold - Supine Isometric Shoulder Extension with Towel  - 1 x daily - 7 x weekly - 2 sets - 10 reps - 5 hold - Supine Isometric Shoulder Adduction with Towel Roll  - 1 x daily - 7 x weekly - 2 sets - 10 reps - 5 hold - Seated Bilateral Shoulder Flexion Towel Slide at Table Top  - 1 x daily - 7 x weekly - 1 sets - 3 reps - 30 hold - Supine Posterior Pelvic Tilt  - 1 x daily - 7 x weekly - 2 sets - 5 reps - 30 hold - Supine LE Neural Mobilization  - 1 x daily - 7 x weekly - 2 sets - 10 reps - 5 hold - Seated Ankle Alphabet  - 1 x daily - 7 x weekly - 2 sets - 1 reps - 5 hold  ASSESSMENT:  CLINICAL IMPRESSION:  Patient tolerates introduction to strengthening, low back pain reduction techniques and muscle tissue length improvement. She has significant weakness of ankle with PF requiring addition to HEP. She has significant pain of L shoulder with raising from ball requiring  termination of exercise.     Pt will continue to benefit from skilled therapy to address remaining deficits in order to improve overall QoL and return to PLOF.      OBJECTIVE IMPAIRMENTS: cardiopulmonary status limiting activity, decreased activity tolerance, decreased mobility, increased fascial restrictions, impaired perceived functional ability, increased muscle spasms, impaired flexibility, impaired UE functional use, improper body mechanics, postural dysfunction, and pain.   ACTIVITY LIMITATIONS: carrying, lifting, bed mobility, bathing, dressing, self feeding, reach over head, and caring for others  PARTICIPATION LIMITATIONS: meal prep, cleaning, laundry, personal finances, interpersonal relationship, driving, shopping, community activity, yard work, church, and caregiving for husband  PERSONAL FACTORS: Age, Fitness, Past/current experiences, Time since onset of injury/illness/exacerbation, and 3+ comorbidities: aorta type A dissection in sept 2011 with aortic valve replacement bypass of R coronary artery, AAA, anemia, anxiety, CAD, CHF, depression, GERD, Heart failure, HLD, HTN, IBS, Kidney problems, migraine, osteoarthritis, osteoporosis, RA, ocular migraines, back pain  are also affecting patient's functional outcome.   REHAB POTENTIAL: Fair    CLINICAL DECISION MAKING: Evolving/moderate complexity  EVALUATION COMPLEXITY: Moderate   GOALS: Goals reviewed with patient? Yes  SHORT TERM GOALS: Target date: 08/09/2023  Patient will be independent in home exercise program to improve strength/mobility for better functional independence with ADLs. Baseline:7/23: HEP given 9/16: compliant 1x/week  Goal status: On going     LONG TERM GOALS: Target date: 11/28/2023    Patient will increase FOTO score to equal to or greater than  57%   to demonstrate statistically significant improvement in mobility and quality of life.  Baseline: 7/22: 46 9/16: 50% 10/7: 40%  Goal status: Partially  Met  2.  Patient will improve shoulder AROM to > 140 degrees of flexion, scaption, and abduction for improved ability to perform overhead activities. Baseline: 7/22: see above 9/16: see above 10/7 : defer to exacerbation  Goal status: Partially Met   3.  Patient will demonstrate adequate shoulder ROM and strength to be able to shave and dress independently with pain less than 3/10. Baseline: 7/23: 6/10 pain with dressing, limited ROM, unable to test strength 9/16: 3/10 pain with dressing  Goal status: MET  4.  Patient will decrease Quick DASH score by >  8 points demonstrating reduced self-reported upper extremity disability for L and R shoulder  Baseline: 7/23: L shoulder 50% 9/16: R=54.54 % L 66% L 81% R 24%  Goal status: Modified   5.  Patient will be able to eat an entire meal without resting arm demonstrating improved functional strength Baseline: 7/23: has to rest while eating due to pain. 9/16: able to make halfway through meal 10/7: can make it about halfway through meal, some meals can get 3/4s through meal Goal status: Partially Met  6.  Patient will be able to stand 15 minutes without her LLE going numb allowing for ADLS such as showering and iADLs such as cooking Baseline: 5 minutes  Goal status: NEW  7. Patient will increase six minute walk test distance to >1500 for progression to community ambulator and improve gait ability Baseline: 1160 ft   Goal status: NEW  PLAN:  PT FREQUENCY: 1x/week  PT DURATION: 8 weeks  PLANNED INTERVENTIONS: Therapeutic exercises, Therapeutic activity, Neuromuscular re-education, Balance training, Gait training, Patient/Family education, Self Care, Joint mobilization, Vestibular training, Canalith repositioning, Visual/preceptual remediation/compensation, Dry Needling, Electrical stimulation, Spinal mobilization, Cryotherapy, Moist heat, Compression bandaging, scar mobilization, Splintting, Taping, Traction, Ultrasound, Biofeedback,  Ionotophoresis 4mg /ml Dexamethasone, Manual therapy, and Re-evaluation  PLAN FOR NEXT SESSION:   Continue with LUE strengthening  L ankle strengthening, distraction, core activation    Precious Bard PT  Physical Therapist - [facility]  [facility]  10/11/23, 9:10 AM

## 2023-10-10 ENCOUNTER — Ambulatory Visit: Payer: Medicare HMO

## 2023-10-10 DIAGNOSIS — M25511 Pain in right shoulder: Secondary | ICD-10-CM | POA: Diagnosis not present

## 2023-10-10 DIAGNOSIS — R293 Abnormal posture: Secondary | ICD-10-CM

## 2023-10-10 DIAGNOSIS — M25512 Pain in left shoulder: Secondary | ICD-10-CM

## 2023-10-10 DIAGNOSIS — M6281 Muscle weakness (generalized): Secondary | ICD-10-CM

## 2023-10-10 DIAGNOSIS — M25612 Stiffness of left shoulder, not elsewhere classified: Secondary | ICD-10-CM | POA: Diagnosis not present

## 2023-10-10 DIAGNOSIS — M5459 Other low back pain: Secondary | ICD-10-CM | POA: Diagnosis not present

## 2023-10-10 DIAGNOSIS — G8929 Other chronic pain: Secondary | ICD-10-CM | POA: Diagnosis not present

## 2023-10-11 ENCOUNTER — Ambulatory Visit: Payer: Medicare HMO | Admitting: Psychology

## 2023-10-12 ENCOUNTER — Ambulatory Visit (INDEPENDENT_AMBULATORY_CARE_PROVIDER_SITE_OTHER): Payer: Medicare HMO | Admitting: Psychology

## 2023-10-12 ENCOUNTER — Ambulatory Visit: Payer: Medicare HMO | Admitting: Physical Therapy

## 2023-10-12 DIAGNOSIS — F331 Major depressive disorder, recurrent, moderate: Secondary | ICD-10-CM

## 2023-10-12 NOTE — Progress Notes (Signed)
[facility] Counselor/Therapist Progress Note  Patient ID: VADER HELLMER, MRN: [id number],    Date: 10/12/2023  Time Spent: 12:00pm-12:50pm  50 minutes   Treatment Type: Individual Therapy  Reported Symptoms: stress  Mental Status Exam: Appearance:  Casual     Behavior: Appropriate  Motor: Normal  Speech/Language:  Normal Rate  Affect: Appropriate  Mood: normal  Thought process: normal  Thought content:   WNL  Sensory/Perceptual disturbances:   WNL  Orientation: oriented to person, place, time/date, and situation  Attention: Good  Concentration: Good  Memory: WNL  Fund of knowledge:  Good  Insight:   Good  Judgment:  Good  Impulse Control: Good   Risk Assessment: Danger to Self:  No Self-injurious Behavior: No Danger to Others: No Duty to Warn:no Physical Aggression / Violence:No  Access to Firearms a concern: No  Gang Involvement:No   Subjective: Pt present for face-to-face individual therapy via video.  Pt consents to telehealth video session and is aware of limitations and benefits of video sessions. Location of pt: home Location of therapist: home office.    Pt talked about her 54 yo sister being in the hospital.  She has cancer.  Pt is worried about her sister.  Addressed pt's concerns. Pt talked about [name].   He had cataract surgery yesterday and it went well.  His cognitive deficits are getting worse.  Addressed how this impacts pt.  Pt has a lot of doctors appointments for [name] and herself. Addressed pt's frustrations with caregiving.  [name] is busy looking for things to get into today.  This can be frustrating bc he gets rid of things he shouldn't and makes things hard to find.  Pt talked about her plans to have her house renovated after Thanksgiving.   Worked on self care strategies.  Provided supportive therapy.  Interventions: Cognitive Behavioral Therapy and Insight-Oriented  Diagnosis:  F33.1  Plan of Care:  Recommend ongoing therapy.   Pt participated in setting treatment goals.   Pt needs a place to talk about stressors.  Plan to meet every two weeks.    Treatment Plan (target date: 03/23/2024) Client Abilities/Strengths  Pt is bright, engaging, and motivated for therapy.   Client Treatment Preferences  Individual therapy.  Client Statement of Needs  Improve coping skills.  Have a place to talk about stressors.   Symptoms  Depressed or irritable mood. Unresolved grief issues.  Problems Addressed  Unipolar Depression Goals 1. Alleviate depressive symptoms and return to previous level of effective functioning. 2. Appropriately grieve the loss in order to normalize mood and to return to previously adaptive level of functioning. Objective Learn and implement behavioral strategies to overcome depression. Target Date: 2024-03-23 Frequency: Biweekly  Progress: 40 Modality: individual  Related Interventions Engage the client in "behavioral activation," increasing his/her activity level and contact with sources of reward, while identifying processes that inhibit activation.  Use behavioral techniques such as instruction, rehearsal, role-playing, role reversal, as needed, to facilitate activity in the client's daily life; reinforce success. Assist the client in developing skills that increase the likelihood of deriving pleasure from behavioral activation (e.g., assertiveness skills, developing an exercise plan, less internal/more external focus, increased social involvement); reinforce success. Objective Identify important people in life, past and present, and describe the quality, good and poor, of those relationships. Target Date: 2024-03-23 Frequency: Biweekly  Progress: 40 Modality: individual  Related Interventions Conduct Interpersonal Therapy beginning with the assessment of the client's "interpersonal inventory" of  important past and present relationships; develop a case formulation linking  depression to grief, interpersonal role disputes, role transitions, and/or interpersonal deficits). Objective Learn and implement problem-solving and decision-making skills. Target Date: 2024-03-23 Frequency: Biweekly  Progress: 40 Modality: individual  Related Interventions Conduct Problem-Solving Therapy using techniques such as psychoeducation, modeling, and role-playing to teach client problem-solving skills (i.e., defining a problem specifically, generating possible solutions, evaluating the pros and cons of each solution, selecting and implementing a plan of action, evaluating the efficacy of the plan, accepting or revising the plan); role-play application of the problem-solving skill to a real life issue. Encourage in the client the development of a positive problem orientation in which problems and solving them are viewed as a natural part of life and not something to be feared, despaired, or avoided. 3. Develop healthy interpersonal relationships that lead to the alleviation and help prevent the relapse of depression. 4. Develop healthy thinking patterns and beliefs about self, others, and the world that lead to the alleviation and help prevent the relapse of depression. 5. Recognize, accept, and cope with feelings of depression. Diagnosis F33.1  Conditions For Discharge Achievement of treatment goals and objectives   Salomon Fick, LCSW

## 2023-10-13 ENCOUNTER — Ambulatory Visit: Payer: Medicare HMO

## 2023-10-13 ENCOUNTER — Encounter (INDEPENDENT_AMBULATORY_CARE_PROVIDER_SITE_OTHER): Payer: Self-pay | Admitting: Family Medicine

## 2023-10-13 ENCOUNTER — Ambulatory Visit (INDEPENDENT_AMBULATORY_CARE_PROVIDER_SITE_OTHER): Payer: Medicare HMO | Admitting: Family Medicine

## 2023-10-13 VITALS — BP 109/66 | HR 70 | Temp 97.5°F | Ht 65.0 in | Wt 208.0 lb

## 2023-10-13 DIAGNOSIS — E669 Obesity, unspecified: Secondary | ICD-10-CM | POA: Diagnosis not present

## 2023-10-13 DIAGNOSIS — F5089 Other specified eating disorder: Secondary | ICD-10-CM | POA: Diagnosis not present

## 2023-10-13 DIAGNOSIS — Z6834 Body mass index (BMI) 34.0-34.9, adult: Secondary | ICD-10-CM

## 2023-10-13 DIAGNOSIS — R7303 Prediabetes: Secondary | ICD-10-CM

## 2023-10-13 DIAGNOSIS — F3289 Other specified depressive episodes: Secondary | ICD-10-CM

## 2023-10-13 MED ORDER — TOPIRAMATE 50 MG PO TABS
50.0000 mg | ORAL_TABLET | Freq: Every evening | ORAL | 0 refills | Status: DC
Start: 2023-10-13 — End: 2023-11-14

## 2023-10-13 NOTE — Progress Notes (Signed)
Chief Complaint:   OBESITY Rachel Torres is here to discuss her progress with her obesity treatment plan along with follow-up of her obesity related diagnoses. Rachel Torres is on following a lower carbohydrate, vegetable and lean protein rich diet plan and states she is following her eating plan approximately 80% of the time. Rachel Torres states she is doing physical therapy for 10-45 minutes 1-7 times per week.  Today's visit was #: 37 Starting weight: 213 lbs Starting date: 05/05/2020 Today's weight: 208 lbs Today's date: 10/13/2023 Total lbs lost to date: 5 Total lbs lost since last in-office visit: 0  Interim History: Patient is dealing with quite a bit of stress recently.  Her husband is dealing with severe Parkinson's and they are dealing with changing veteran care.  Her sister has been hospitalized for a foot amputation and just got diagnosed with cancer.  She is about to get her kitchen remodeled.    Subjective:   1. Prediabetes Patient's last A1c was 5.9 and insulin 22.4. She is not on medications.   2. Emotional Eating Behavior Patient is on topiramate daily, with no side effects noted.  Assessment/Plan:   1. Prediabetes Patient will continue her low carbohydrate plan, and we will repeat labs in December.   2. Emotional Eating Behavior We will refill topiramate 50 mg once daily for 1 month.   - topiramate (TOPAMAX) 50 MG tablet; Take 1 tablet (50 mg total) by mouth at bedtime.  Dispense: 30 tablet; Refill: 0  3. BMI 34.0-34.9,adult  4. Obesity, Beginning BMI 35.45 Rachel Torres is currently in the action stage of change. As such, her goal is to continue with weight loss efforts. She has agreed to following a lower carbohydrate, vegetable and lean protein rich diet plan.   Exercise goals: All adults should avoid inactivity. Some physical activity is better than none, and adults who participate in any amount of physical activity gain some health benefits.  Behavioral modification  strategies: increasing lean protein intake, meal planning and cooking strategies, keeping healthy foods in the home, and planning for success.  Rachel Torres has agreed to follow-up with our clinic in 4 weeks. She was informed of the importance of frequent follow-up visits to maximize her success with intensive lifestyle modifications for her multiple health conditions.   Objective:   Blood pressure 109/66, pulse 70, temperature (!) 97.5 F (36.4 C), height 5\' 5"  (1.651 m), weight 208 lb (94.3 kg), SpO2 96%. Body mass index is 34.61 kg/m.  General: Cooperative, alert, well developed, in no acute distress. HEENT: Conjunctivae and lids unremarkable. Cardiovascular: Regular rhythm.  Lungs: Normal work of breathing. Neurologic: No focal deficits.   Lab Results  Component Value Date   CREATININE 0.99 08/17/2023   BUN 15 08/17/2023   NA 141 08/17/2023   K 4.3 08/17/2023   CL 105 08/17/2023   CO2 22 08/17/2023   Lab Results  Component Value Date   ALT 20 08/17/2023   AST 23 08/17/2023   ALKPHOS 92 08/17/2023   BILITOT 0.4 08/17/2023   Lab Results  Component Value Date   HGBA1C 5.8 (H) 08/17/2023   HGBA1C 5.9 (H) 02/17/2023   HGBA1C 5.8 (H) 10/26/2022   HGBA1C 5.5 03/17/2022   HGBA1C 5.5 12/03/2021   Lab Results  Component Value Date   INSULIN 18.6 08/17/2023   INSULIN 22.4 02/17/2023   INSULIN 11.7 10/26/2022   INSULIN 4.5 12/03/2021   INSULIN 17.3 02/26/2021   Lab Results  Component Value Date   TSH 1.280 10/26/2022  Lab Results  Component Value Date   CHOL 161 08/17/2023   HDL 59 08/17/2023   LDLCALC 83 08/17/2023   LDLDIRECT 189.4 10/13/2011   TRIG 108 08/17/2023   CHOLHDL 3 03/17/2022   Lab Results  Component Value Date   VD25OH 45.0 08/17/2023   VD25OH 36.4 02/17/2023   VD25OH 57.6 10/26/2022   Lab Results  Component Value Date   WBC 5.1 10/26/2022   HGB 13.6 10/26/2022   HCT 42.0 10/26/2022   MCV 98 (H) 10/26/2022   PLT 333 10/26/2022   Lab  Results  Component Value Date   FERRITIN 105.0 12/11/2010   Attestation Statements:   Reviewed by clinician on day of visit: allergies, medications, problem list, medical history, surgical history, family history, social history, and previous encounter notes.   I, Burt Knack, am acting as transcriptionist for Reuben Likes, MD.  I have reviewed the above documentation for accuracy and completeness, and I agree with the above. - Reuben Likes, MD

## 2023-10-17 ENCOUNTER — Ambulatory Visit: Payer: Medicare HMO

## 2023-10-18 ENCOUNTER — Ambulatory Visit: Payer: Medicare HMO | Admitting: Psychology

## 2023-10-19 ENCOUNTER — Ambulatory Visit: Payer: Medicare HMO | Admitting: Psychology

## 2023-10-19 ENCOUNTER — Ambulatory Visit (INDEPENDENT_AMBULATORY_CARE_PROVIDER_SITE_OTHER): Payer: Medicare HMO | Admitting: Psychology

## 2023-10-19 ENCOUNTER — Ambulatory Visit: Payer: Medicare HMO

## 2023-10-19 DIAGNOSIS — F331 Major depressive disorder, recurrent, moderate: Secondary | ICD-10-CM

## 2023-10-19 NOTE — Progress Notes (Signed)
[facility] Counselor/Therapist Progress Note  Patient ID: AMPARO KANAGY, MRN: [id number],    Date: 10/19/2023  Time Spent: 11:00am-11:55am  55 minutes   Treatment Type: Individual Therapy  Reported Symptoms: stress  Mental Status Exam: Appearance:  Casual     Behavior: Appropriate  Motor: Normal  Speech/Language:  Normal Rate  Affect: Appropriate  Mood: normal  Thought process: normal  Thought content:   WNL  Sensory/Perceptual disturbances:   WNL  Orientation: oriented to person, place, time/date, and situation  Attention: Good  Concentration: Good  Memory: WNL  Fund of knowledge:  Good  Insight:   Good  Judgment:  Good  Impulse Control: Good   Risk Assessment: Danger to Self:  No Self-injurious Behavior: No Danger to Others: No Duty to Warn:no Physical Aggression / Violence:No  Access to Firearms a concern: No  Gang Involvement:No   Subjective: Pt present for face-to-face individual therapy in person.  Pt talked about her sister who has cancer.  Addressed pt's worries about her sister.    Pt talked about her health.   She is having left sided weakness and needs to see a neurologist.   Addressed pt's health concerns and worries.   Pt talked about [name].    Addressed pt's frustrations with caregiving.  Pt talked about her plans to have her house renovated after Thanksgiving.  Pt feels stress about all the preparation she needs to do.  Worked on self care strategies.  Provided supportive therapy.  Interventions: Cognitive Behavioral Therapy and Insight-Oriented  Diagnosis:  F33.1  Plan of Care: Recommend ongoing therapy.   Pt participated in setting treatment goals.   Pt needs a place to talk about stressors.  Plan to meet every two weeks.    Treatment Plan (target date: 03/23/2024) Client Abilities/Strengths  Pt is bright, engaging, and motivated for therapy.   Client Treatment Preferences  Individual therapy.  Client  Statement of Needs  Improve coping skills.  Have a place to talk about stressors.   Symptoms  Depressed or irritable mood. Unresolved grief issues.  Problems Addressed  Unipolar Depression Goals 1. Alleviate depressive symptoms and return to previous level of effective functioning. 2. Appropriately grieve the loss in order to normalize mood and to return to previously adaptive level of functioning. Objective Learn and implement behavioral strategies to overcome depression. Target Date: 2024-03-23 Frequency: Biweekly  Progress: 40 Modality: individual  Related Interventions Engage the client in "behavioral activation," increasing his/her activity level and contact with sources of reward, while identifying processes that inhibit activation.  Use behavioral techniques such as instruction, rehearsal, role-playing, role reversal, as needed, to facilitate activity in the client's daily life; reinforce success. Assist the client in developing skills that increase the likelihood of deriving pleasure from behavioral activation (e.g., assertiveness skills, developing an exercise plan, less internal/more external focus, increased social involvement); reinforce success. Objective Identify important people in life, past and present, and describe the quality, good and poor, of those relationships. Target Date: 2024-03-23 Frequency: Biweekly  Progress: 40 Modality: individual  Related Interventions Conduct Interpersonal Therapy beginning with the assessment of the client's "interpersonal inventory" of important past and present relationships; develop a case formulation linking depression to grief, interpersonal role disputes, role transitions, and/or interpersonal deficits). Objective Learn and implement problem-solving and decision-making skills. Target Date: 2024-03-23 Frequency: Biweekly  Progress: 40 Modality: individual  Related Interventions Conduct Problem-Solving Therapy using techniques such  as psychoeducation, modeling, and role-playing to teach client problem-solving skills (  i.e., defining a problem specifically, generating possible solutions, evaluating the pros and cons of each solution, selecting and implementing a plan of action, evaluating the efficacy of the plan, accepting or revising the plan); role-play application of the problem-solving skill to a real life issue. Encourage in the client the development of a positive problem orientation in which problems and solving them are viewed as a natural part of life and not something to be feared, despaired, or avoided. 3. Develop healthy interpersonal relationships that lead to the alleviation and help prevent the relapse of depression. 4. Develop healthy thinking patterns and beliefs about self, others, and the world that lead to the alleviation and help prevent the relapse of depression. 5. Recognize, accept, and cope with feelings of depression. Diagnosis F33.1  Conditions For Discharge Achievement of treatment goals and objectives   Salomon Fick, LCSW

## 2023-10-24 ENCOUNTER — Ambulatory Visit: Payer: Medicare HMO

## 2023-10-24 DIAGNOSIS — U071 COVID-19: Secondary | ICD-10-CM | POA: Diagnosis not present

## 2023-10-24 DIAGNOSIS — R509 Fever, unspecified: Secondary | ICD-10-CM | POA: Diagnosis not present

## 2023-10-24 DIAGNOSIS — R059 Cough, unspecified: Secondary | ICD-10-CM | POA: Diagnosis not present

## 2023-10-25 ENCOUNTER — Ambulatory Visit: Payer: Medicare HMO | Admitting: Psychology

## 2023-10-26 ENCOUNTER — Ambulatory Visit: Payer: Medicare HMO | Attending: Orthopedic Surgery

## 2023-10-31 ENCOUNTER — Ambulatory Visit: Payer: Medicare HMO | Admitting: Psychology

## 2023-10-31 ENCOUNTER — Ambulatory Visit: Payer: Medicare HMO

## 2023-10-31 DIAGNOSIS — F331 Major depressive disorder, recurrent, moderate: Secondary | ICD-10-CM

## 2023-10-31 NOTE — Progress Notes (Signed)
[facility] Counselor/Therapist Progress Note  Patient ID: Rachel Torres, MRN: [id number],    Date: 10/31/2023  Time Spent: 10:00am-10:50am  50 minutes   Treatment Type: Individual Therapy  Reported Symptoms: stress  Mental Status Exam: Appearance:  Casual     Behavior: Appropriate  Motor: Normal  Speech/Language:  Normal Rate  Affect: Appropriate  Mood: normal  Thought process: normal  Thought content:   WNL  Sensory/Perceptual disturbances:   WNL  Orientation: oriented to person, place, time/date, and situation  Attention: Good  Concentration: Good  Memory: WNL  Fund of knowledge:  Good  Insight:   Good  Judgment:  Good  Impulse Control: Good   Risk Assessment: Danger to Self:  No Self-injurious Behavior: No Danger to Others: No Duty to Warn:no Physical Aggression / Violence:No  Access to Firearms a concern: No  Gang Involvement:No   Subjective: Pt present for face-to-face individual therapy via video.  Pt consents to telehealth video session and is aware of limitations and benefits of virtual sessions.  Location of pt: home Location of therapist: home office.  Pt talked about [name] getting covid.  He was hospitalized last week and pt will pick him up from the hospital today.   Pt had covid last week as well and still does not have her sense of taste totally back.  Pt has been able to sleep and rest since [name] has been in the hospital.   Pt talked about her sister who has cancer.   Her sister gets her biopsies today.  Addressed pt's concerns about her sister.   Pt talked about her 66 yo mother.   Her mother calls her several times a day.   At times this gets frustrating for pt.   Worked on self care strategies.  Provided supportive therapy.  Interventions: Cognitive Behavioral Therapy and Insight-Oriented  Diagnosis:  F33.1  Plan of Care: Recommend ongoing therapy.   Pt participated in setting treatment goals.   Pt needs a place to talk  about stressors.  Plan to meet every two weeks.    Treatment Plan (target date: 03/23/2024) Client Abilities/Strengths  Pt is bright, engaging, and motivated for therapy.   Client Treatment Preferences  Individual therapy.  Client Statement of Needs  Improve coping skills.  Have a place to talk about stressors.   Symptoms  Depressed or irritable mood. Unresolved grief issues.  Problems Addressed  Unipolar Depression Goals 1. Alleviate depressive symptoms and return to previous level of effective functioning. 2. Appropriately grieve the loss in order to normalize mood and to return to previously adaptive level of functioning. Objective Learn and implement behavioral strategies to overcome depression. Target Date: 2024-03-23 Frequency: Biweekly  Progress: 40 Modality: individual  Related Interventions Engage the client in "behavioral activation," increasing his/her activity level and contact with sources of reward, while identifying processes that inhibit activation.  Use behavioral techniques such as instruction, rehearsal, role-playing, role reversal, as needed, to facilitate activity in the client's daily life; reinforce success. Assist the client in developing skills that increase the likelihood of deriving pleasure from behavioral activation (e.g., assertiveness skills, developing an exercise plan, less internal/more external focus, increased social involvement); reinforce success. Objective Identify important people in life, past and present, and describe the quality, good and poor, of those relationships. Target Date: 2024-03-23 Frequency: Biweekly  Progress: 40 Modality: individual  Related Interventions Conduct Interpersonal Therapy beginning with the assessment of the client's "interpersonal inventory" of important past and present relationships; develop  a case formulation linking depression to grief, interpersonal role disputes, role transitions, and/or interpersonal  deficits). Objective Learn and implement problem-solving and decision-making skills. Target Date: 2024-03-23 Frequency: Biweekly  Progress: 40 Modality: individual  Related Interventions Conduct Problem-Solving Therapy using techniques such as psychoeducation, modeling, and role-playing to teach client problem-solving skills (i.e., defining a problem specifically, generating possible solutions, evaluating the pros and cons of each solution, selecting and implementing a plan of action, evaluating the efficacy of the plan, accepting or revising the plan); role-play application of the problem-solving skill to a real life issue. Encourage in the client the development of a positive problem orientation in which problems and solving them are viewed as a natural part of life and not something to be feared, despaired, or avoided. 3. Develop healthy interpersonal relationships that lead to the alleviation and help prevent the relapse of depression. 4. Develop healthy thinking patterns and beliefs about self, others, and the world that lead to the alleviation and help prevent the relapse of depression. 5. Recognize, accept, and cope with feelings of depression. Diagnosis F33.1  Conditions For Discharge Achievement of treatment goals and objectives   Salomon Fick, LCSW

## 2023-11-02 ENCOUNTER — Ambulatory Visit: Payer: Medicare HMO

## 2023-11-08 ENCOUNTER — Telehealth: Payer: Self-pay

## 2023-11-08 ENCOUNTER — Ambulatory Visit: Payer: Medicare HMO

## 2023-11-08 ENCOUNTER — Ambulatory Visit (INDEPENDENT_AMBULATORY_CARE_PROVIDER_SITE_OTHER): Payer: Medicare HMO | Admitting: Psychology

## 2023-11-08 DIAGNOSIS — F331 Major depressive disorder, recurrent, moderate: Secondary | ICD-10-CM | POA: Diagnosis not present

## 2023-11-08 NOTE — Progress Notes (Signed)
[facility] Counselor/Therapist Progress Note  Patient ID: VINIE ZILINSKI, MRN: [id number],    Date: 11/08/2023  Time Spent: 10:00am-10:55am  55 minutes   Treatment Type: Individual Therapy  Reported Symptoms: stress  Mental Status Exam: Appearance:  Casual     Behavior: Appropriate  Motor: Normal  Speech/Language:  Normal Rate  Affect: Appropriate  Mood: normal  Thought process: normal  Thought content:   WNL  Sensory/Perceptual disturbances:   WNL  Orientation: oriented to person, place, time/date, and situation  Attention: Good  Concentration: Good  Memory: WNL  Fund of knowledge:  Good  Insight:   Good  Judgment:  Good  Impulse Control: Good   Risk Assessment: Danger to Self:  No Self-injurious Behavior: No Danger to Others: No Duty to Warn:no Physical Aggression / Violence:No  Access to Firearms a concern: No  Gang Involvement:No   Subjective: Pt present for face-to-face individual therapy via video.  Pt consents to telehealth video session and is aware of limitations and benefits of virtual sessions.  Location of pt: home Location of therapist: home office.  Pt talked about [name].   His sundowning has been worse.   This can be challenging for care giving.  Addressed how this impacts pt.    Pt talked about her kitchen remodel starting after thanksgiving.  There is a lot pt has to do to prepare and she feels overwhelmed.   Worked on [employer], dispensing. Pt talked about her mother who meddles in everyone's business and calls pt several times a day.   Pt feels like she needs to set boundaries with her mother.  Helped pt process her feelings and relationship dynamics.  Worked on self care strategies.  Provided supportive therapy.  Interventions: Cognitive Behavioral Therapy and Insight-Oriented  Diagnosis:  F33.1  Plan of Care: Recommend ongoing therapy.   Pt participated in setting treatment goals.   Pt needs a place to talk about  stressors.  Plan to meet every two weeks.    Treatment Plan (target date: 03/23/2024) Client Abilities/Strengths  Pt is bright, engaging, and motivated for therapy.   Client Treatment Preferences  Individual therapy.  Client Statement of Needs  Improve coping skills.  Have a place to talk about stressors.   Symptoms  Depressed or irritable mood. Unresolved grief issues.  Problems Addressed  Unipolar Depression Goals 1. Alleviate depressive symptoms and return to previous level of effective functioning. 2. Appropriately grieve the loss in order to normalize mood and to return to previously adaptive level of functioning. Objective Learn and implement behavioral strategies to overcome depression. Target Date: 2024-03-23 Frequency: Biweekly  Progress: 40 Modality: individual  Related Interventions Engage the client in "behavioral activation," increasing his/her activity level and contact with sources of reward, while identifying processes that inhibit activation.  Use behavioral techniques such as instruction, rehearsal, role-playing, role reversal, as needed, to facilitate activity in the client's daily life; reinforce success. Assist the client in developing skills that increase the likelihood of deriving pleasure from behavioral activation (e.g., assertiveness skills, developing an exercise plan, less internal/more external focus, increased social involvement); reinforce success. Objective Identify important people in life, past and present, and describe the quality, good and poor, of those relationships. Target Date: 2024-03-23 Frequency: Biweekly  Progress: 40 Modality: individual  Related Interventions Conduct Interpersonal Therapy beginning with the assessment of the client's "interpersonal inventory" of important past and present relationships; develop a case formulation linking depression to grief, interpersonal role disputes, role transitions,  and/or interpersonal  deficits). Objective Learn and implement problem-solving and decision-making skills. Target Date: 2024-03-23 Frequency: Biweekly  Progress: 40 Modality: individual  Related Interventions Conduct Problem-Solving Therapy using techniques such as psychoeducation, modeling, and role-playing to teach client problem-solving skills (i.e., defining a problem specifically, generating possible solutions, evaluating the pros and cons of each solution, selecting and implementing a plan of action, evaluating the efficacy of the plan, accepting or revising the plan); role-play application of the problem-solving skill to a real life issue. Encourage in the client the development of a positive problem orientation in which problems and solving them are viewed as a natural part of life and not something to be feared, despaired, or avoided. 3. Develop healthy interpersonal relationships that lead to the alleviation and help prevent the relapse of depression. 4. Develop healthy thinking patterns and beliefs about self, others, and the world that lead to the alleviation and help prevent the relapse of depression. 5. Recognize, accept, and cope with feelings of depression. Diagnosis F33.1  Conditions For Discharge Achievement of treatment goals and objectives   Salomon Fick, LCSW

## 2023-11-08 NOTE — Telephone Encounter (Signed)
Patient called due to missed appointment today. Pt notified of no show and informed of upcoming appointment on Thursday at 11:00.   Randon Goldsmith, Student-PT

## 2023-11-10 ENCOUNTER — Ambulatory Visit: Payer: Medicare HMO

## 2023-11-14 ENCOUNTER — Encounter (INDEPENDENT_AMBULATORY_CARE_PROVIDER_SITE_OTHER): Payer: Self-pay | Admitting: Internal Medicine

## 2023-11-14 ENCOUNTER — Ambulatory Visit (INDEPENDENT_AMBULATORY_CARE_PROVIDER_SITE_OTHER): Payer: Medicare HMO | Admitting: Internal Medicine

## 2023-11-14 VITALS — BP 127/71 | HR 86 | Temp 97.8°F | Ht 65.0 in | Wt 211.0 lb

## 2023-11-14 DIAGNOSIS — R948 Abnormal results of function studies of other organs and systems: Secondary | ICD-10-CM

## 2023-11-14 DIAGNOSIS — F509 Eating disorder, unspecified: Secondary | ICD-10-CM | POA: Diagnosis not present

## 2023-11-14 DIAGNOSIS — Z6835 Body mass index (BMI) 35.0-35.9, adult: Secondary | ICD-10-CM

## 2023-11-14 DIAGNOSIS — E66812 Obesity, class 2: Secondary | ICD-10-CM | POA: Diagnosis not present

## 2023-11-14 DIAGNOSIS — R635 Abnormal weight gain: Secondary | ICD-10-CM | POA: Insufficient documentation

## 2023-11-14 MED ORDER — TOPIRAMATE 50 MG PO TABS: 50.0000 mg | ORAL_TABLET | Freq: Every evening | ORAL | 1 refills | Status: DC

## 2023-11-14 NOTE — Assessment & Plan Note (Signed)
Patient has lost approximately 60 pounds between 2021 and 2022 but then regained weight over 15 months.  She had been following a reduced calorie nutrition plan and was on GLP-1 at the time.  She reports coming off Ozempic due to medication side effects.  She also had a very abnormal IC few years ago which has not been repeated.  Patient not interested in tracking and journaling and is currently trying to eat healthy and watch her portions.  She is not physically active.  She is on topiramate for appetite control, but has not lost any weight.  Review of BIA information showed that patient did lose significant of muscle mass when she started her journey but has regained some of it.  I think she would benefit from repeating indirect calorimetry which should be done in a fasting state and her nutritional goals adjusted based on results.  Due to brevity of appointment today I was unable to determine if her eating disorder is still present, this should be gauged further at her next follow-up.  I have refilled her topiramate today she will follow-up with Dr. Dalbert Garnet in 4 weeks.

## 2023-11-14 NOTE — Assessment & Plan Note (Signed)
Patient has a slower than predicted metabolism. IC 900s vs. calculated 1595. This may contribute to weight gain, chronic fatigue and difficulty losing weight.  This may be secondary to low levels of physical activity as well as untreated sleep apnea.  We reviewed measures to improve metabolism including not skipping meals, progressive strengthening exercises, increasing protein intake at every meal and maintaining adequate hydration and sleep.   Since has been over 2 years I recommend repeating her indirect calorimetry so she can get back on track with her weight loss journey.

## 2023-11-14 NOTE — Progress Notes (Addendum)
Office: [phone]  /  Fax: [phone]  Weight Summary And Biometrics  Vitals Temp: 97.8 F (36.6 C) BP: 127/71 Pulse Rate: 86 SpO2: 96 %   Anthropometric Measurements Height: 5\' 5"  (1.651 m) Weight: 211 lb (95.7 kg) BMI (Calculated): 35.11 Weight at Last Visit: 208 lb Weight Lost Since Last Visit: 0 lb Weight Gained Since Last Visit: 3 lb Starting Weight: 213 lb Total Weight Loss (lbs): 2 lb (0.907 kg)   Body Composition  Body Fat %: 46.1 % Fat Mass (lbs): 97.6 lbs Muscle Mass (lbs): 108.2 lbs Total Body Water (lbs): 74.8 lbs Visceral Fat Rating : 14    No data recorded Today's Visit #: 38  Starting Date: 05/06/23   Subjective   Chief Complaint: Obesity  Rachel Torres is here to discuss her progress with her obesity treatment plan. She is on the following a lower carbohydrate, vegetable and lean protein rich diet plan and states she is following her eating plan approximately 90 % of the time. She states she is not exercising.  Interval History:   Discussed the use of AI scribe software for clinical note transcription with the patient, who gave verbal consent to proceed.  This was my first encounter with patient she was scheduled for focused visit.  She is affected by obesity, former history of anorexia she also has hypertension, coronary artery disease, combined heart failure and atrial fibrillation.  She also has untreated sleep apnea.  She lost about 60 pounds on Ozempic, but treatment was either interrupted or discontinued due to patient side effects it is unclear.  She also stopped coming to the clinic and regained her weight.The patient had a break in her weight management clinic visits due to personal issues at home and a recent bout of COVID-19. She also experienced a significant physical setback due to a fall that resulted in a broken foot and arm injuries, limiting her physical activity.  She does not like to count calories as she finds this triggers  negative feelings associated with her history of anorexia.  She also has a history of sleep apnea but is not on CPAP due to difficulty achieving a good seal.  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 This role has been complicated by unreliable assistance, leading to missed appointments and increased stress. The patient reports feeling out of breath frequently and is eager to lose weight again.    The patient's weight loss journey has been marked by significant muscle loss, which may have affected her metabolic rate. The patient also reports emotional eating behavior, particularly under stress. She has been trying to increase her physical activity levels but has been limited due to residual symptoms from COVID-19 and physical injuries.   Orexigenic Control:  Denies problems with appetite and hunger signals.  Denies problems with satiety and satiation.  Denies problems with eating patterns and portion control.  Denies abnormal cravings. Denies feeling deprived or restricted.   Barriers identified: presence of obesogenic drugs, inadequate sleep, and sleep apnea.   Pharmacotherapy for weight loss: She is currently taking Topiramate (off label use, single agent) with adequate clinical response  and without side effects..   Assessment and Plan   Treatment Plan For Obesity:  Recommended Dietary Goals  Rachel Torres is currently in the action stage of change. As such, her goal is to continue weight management plan. She has agreed to: portion control, balanced plate and making smarter food choices, such as increasing vegetables, protein intake  and reducing simple carbohydrates and processed foods  and continue current plan  Behavioral Intervention  We discussed the following Behavioral Modification Strategies today: continue to work on maintaining a reduced calorie state, getting the recommended amount of protein, incorporating whole foods, making  healthy choices, staying well hydrated and practicing mindfulness when eating..  Additional resources provided today: None  Recommended Physical Activity Goals  Rachel Torres has been advised to work up to 150 minutes of moderate intensity aerobic activity a week and strengthening exercises 2-3 times per week for cardiovascular health, weight loss maintenance and preservation of muscle mass.   She has agreed to :  Think about enjoyable ways to increase daily physical activity and overcoming barriers to exercise and Increase physical activity in their day and reduce sedentary time (increase NEAT).  Pharmacotherapy  We discussed various medication options to help Rachel Torres with her weight loss efforts and we both agreed to : continue current anti-obesity medication regimen  Associated Conditions Addressed Today     Abnormal metabolism Assessment & Plan: Patient has a slower than predicted metabolism. IC 900s vs. calculated 1595. This may contribute to weight gain, chronic fatigue and difficulty losing weight.  This may be secondary to low levels of physical activity as well as untreated sleep apnea.  We reviewed measures to improve metabolism including not skipping meals, progressive strengthening exercises, increasing protein intake at every meal and maintaining adequate hydration and sleep.   Since has been over 2 years I recommend repeating her indirect calorimetry so she can get back on track with her weight loss journey.   Orders: -     Topiramate; Take 1 tablet (50 mg total) by mouth at bedtime.  Dispense: 30 tablet; Refill: 1  Class 2 severe obesity with serious comorbidity and body mass index (BMI) of 35.0 to 35.9 in adult, unspecified obesity type [facility]) Assessment & Plan: Patient has lost approximately 60 pounds between 2021 and 2022 but then regained weight over 15 months.  She had been following a reduced calorie nutrition plan and was on GLP-1 at the time.  She reports coming off  Ozempic due to medication side effects.  She also had a very abnormal IC few years ago which has not been repeated.  Patient not interested in tracking and journaling and is currently trying to eat healthy and watch her portions.  She is not physically active.  She is on topiramate for appetite control, but has not lost any weight.  Review of BIA information showed that patient did lose significant of muscle mass when she started her journey but has regained some of it.  I think she would benefit from repeating indirect calorimetry which should be done in a fasting state and her nutritional goals adjusted based on results.  Due to brevity of appointment today I was unable to determine if her eating disorder is still present, this should be gauged further at her next follow-up.  I have refilled her topiramate today she will follow-up with Dr. Dalbert Garnet in 4 weeks.  Orders: -     Topiramate; Take 1 tablet (50 mg total) by mouth at bedtime.  Dispense: 30 tablet; Refill: 1  Weight regain after inital weight loss Assessment & Plan: As mentioned above patient has lost 60 pounds but was unable to keep her weight off and has regained all of it.  We discussed the benefits of a more gradual and sustainable weight loss.  She is not interested in GLP-1 therapy as she felt ill on the  medication.  Orders: -     Topiramate; Take 1 tablet (50 mg total) by mouth at bedtime.  Dispense: 30 tablet; Refill: 1  Eating disorder, unspecified type -     Topiramate; Take 1 tablet (50 mg total) by mouth at bedtime.  Dispense: 30 tablet; Refill: 1       Objective   Physical Exam:  Blood pressure 127/71, pulse 86, temperature 97.8 F (36.6 C), height 5\' 5"  (1.651 m), weight 211 lb (95.7 kg), SpO2 96%. Body mass index is 35.11 kg/m.  General: She is overweight, cooperative, alert, well developed, and in no acute distress. PSYCH: Has normal mood, affect and thought process.   HEENT: EOMI, sclerae are  anicteric. Lungs: Normal breathing effort, no conversational dyspnea. Extremities: No edema.  Neurologic: No gross sensory or motor deficits. No tremors or fasciculations noted.    Diagnostic Data Reviewed:  BMET    Component Value Date/Time   NA 141 08/17/2023 0950   NA 143 06/20/2013 0815   K 4.3 08/17/2023 0950   K 3.8 06/20/2013 0815   CL 105 08/17/2023 0950   CL 107 06/20/2013 0815   CO2 22 08/17/2023 0950   CO2 26 06/20/2013 0815   GLUCOSE 113 (H) 08/17/2023 0950   GLUCOSE 89 03/17/2022 1017   GLUCOSE 106 (H) 06/20/2013 0815   GLUCOSE 90 12/29/2006 1251   BUN 15 08/17/2023 0950   BUN 16 06/20/2013 0815   CREATININE 0.99 08/17/2023 0950   CREATININE 0.94 06/20/2013 0815   CREATININE 0.88 04/09/2011 0900   CALCIUM 9.1 08/17/2023 0950   CALCIUM 8.8 06/20/2013 0815   GFRNONAA 68 07/23/2020 1533   GFRNONAA >60 06/20/2013 0815   GFRAA 78 07/23/2020 1533   GFRAA >60 06/20/2013 0815   Lab Results  Component Value Date   HGBA1C 5.8 (H) 08/17/2023   HGBA1C 5.4 02/10/2016   Lab Results  Component Value Date   INSULIN 18.6 08/17/2023   INSULIN 25.2 (H) 05/05/2020   Lab Results  Component Value Date   TSH 1.280 10/26/2022   CBC    Component Value Date/Time   WBC 5.1 10/26/2022 0902   WBC 5.6 07/10/2021 0810   RBC 4.27 10/26/2022 0902   RBC 4.29 07/10/2021 0810   HGB 13.6 10/26/2022 0902   HCT 42.0 10/26/2022 0902   PLT 333 10/26/2022 0902   MCV 98 (H) 10/26/2022 0902   MCV 94 06/20/2013 0815   MCH 31.9 10/26/2022 0902   MCH 32.5 01/21/2018 1841   MCHC 32.4 10/26/2022 0902   MCHC 33.6 07/10/2021 0810   RDW 13.4 10/26/2022 0902   RDW 13.2 06/20/2013 0815   Iron Studies    Component Value Date/Time   FERRITIN 105.0 12/11/2010 1101   Lipid Panel     Component Value Date/Time   CHOL 161 08/17/2023 0950   TRIG 108 08/17/2023 0950   TRIG 110 12/29/2006 1251   HDL 59 08/17/2023 0950   CHOLHDL 3 03/17/2022 1017   VLDL 14.0 03/17/2022 1017   LDLCALC 83  08/17/2023 0950   LDLDIRECT 189.4 10/13/2011 1054   Hepatic Function Panel     Component Value Date/Time   PROT 7.2 08/17/2023 0950   PROT 7.0 06/20/2013 0815   ALBUMIN 4.3 08/17/2023 0950   ALBUMIN 3.5 06/20/2013 0815   AST 23 08/17/2023 0950   AST 17 06/20/2013 0815   ALT 20 08/17/2023 0950   ALT 26 06/20/2013 0815   ALKPHOS 92 08/17/2023 0950   ALKPHOS 68 06/20/2013 0815  BILITOT 0.4 08/17/2023 0950   BILITOT 0.3 06/20/2013 0815   BILIDIR 0.09 10/27/2015 0838      Component Value Date/Time   TSH 1.280 10/26/2022 0902   Nutritional Lab Results  Component Value Date   VD25OH 45.0 08/17/2023   VD25OH 36.4 02/17/2023   VD25OH 57.6 10/26/2022    Follow-Up   Return in about 4 weeks (around 12/12/2023) for Dr. Dalbert Garnet or PAs - Needs to fast IC. .. She was informed of the importance of frequent follow up visits to maximize her success with intensive lifestyle modifications for her multiple health conditions.  Attestation Statement   Reviewed by clinician on day of visit: allergies, medications, problem list, medical history, surgical history, family history, social history, and previous encounter notes.     Worthy Rancher, MD

## 2023-11-14 NOTE — Assessment & Plan Note (Signed)
As mentioned above patient has lost 60 pounds but was unable to keep her weight off and has regained all of it.  We discussed the benefits of a more gradual and sustainable weight loss.  She is not interested in GLP-1 therapy as she felt ill on the medication.

## 2023-11-15 ENCOUNTER — Ambulatory Visit (INDEPENDENT_AMBULATORY_CARE_PROVIDER_SITE_OTHER): Payer: Medicare HMO | Admitting: Psychology

## 2023-11-15 ENCOUNTER — Ambulatory Visit: Payer: Medicare HMO

## 2023-11-15 DIAGNOSIS — F331 Major depressive disorder, recurrent, moderate: Secondary | ICD-10-CM | POA: Diagnosis not present

## 2023-11-15 NOTE — Progress Notes (Signed)
[facility] Counselor/Therapist Progress Note  Patient ID: Rachel Torres, MRN: [id number],    Date: 11/15/2023  Time Spent: 9:00am-9:55am  55 minutes   Treatment Type: Individual Therapy  Reported Symptoms: stress  Mental Status Exam: Appearance:  Casual     Behavior: Appropriate  Motor: Normal  Speech/Language:  Normal Rate  Affect: Appropriate  Mood: normal  Thought process: normal  Thought content:   WNL  Sensory/Perceptual disturbances:   WNL  Orientation: oriented to person, place, time/date, and situation  Attention: Good  Concentration: Good  Memory: WNL  Fund of knowledge:  Good  Insight:   Good  Judgment:  Good  Impulse Control: Good   Risk Assessment: Danger to Self:  No Self-injurious Behavior: No Danger to Others: No Duty to Warn:no Physical Aggression / Violence:No  Access to Firearms a concern: No  Gang Involvement:No   Subjective: Pt present for face-to-face individual therapy via video.  Pt consents to telehealth video session and is aware of limitations and benefits of virtual sessions.  Location of pt: home Location of therapist: home office.  Pt talked about the upcoming kitchen renovation.  The workers will start the demo on the kitchen next week.  Pt is very excited about the renovation but is also very stressed about it.  Addressed pt's stress and worked on Optician, dispensing.  Pt talked about Rachel husband Rachel Torres.   He fell recently and broke a piece of furniture.   He is ok but pt worries about Rachel Torres when he falls.  Pt talked about the Rachel Torres.  Pt is planning a birthday party for Rachel Torres in December.  [name] Torres can be demanding but is very well known so the party will be a big production.  Worked on self care strategies.  Provided supportive therapy.  Interventions: Cognitive Behavioral Therapy and Insight-Oriented  Diagnosis:  F33.1  Plan of Care: Recommend ongoing therapy.   Pt participated in setting treatment  goals.   Pt needs a place to talk about stressors.  Plan to meet every two weeks.    Treatment Plan (target date: 03/23/2024) Client Abilities/Strengths  Pt is bright, engaging, and motivated for therapy.   Client Treatment Preferences  Individual therapy.  Client Statement of Needs  Improve coping skills.  Have a place to talk about stressors.   Symptoms  Depressed or irritable mood. Unresolved grief issues.  Problems Addressed  Unipolar Depression Goals 1. Alleviate depressive symptoms and return to previous level of effective functioning. 2. Appropriately grieve the loss in order to normalize mood and to return to previously adaptive level of functioning. Objective Learn and implement behavioral strategies to overcome depression. Target Date: 2024-03-23 Frequency: Biweekly  Progress: 40 Modality: individual  Related Interventions Engage the client in "behavioral activation," increasing his/Rachel activity level and contact with sources of reward, while identifying processes that inhibit activation.  Use behavioral techniques such as instruction, rehearsal, role-playing, role reversal, as needed, to facilitate activity in the client's daily life; reinforce success. Assist the client in developing skills that increase the likelihood of deriving pleasure from behavioral activation (e.g., assertiveness skills, developing an exercise plan, less internal/more external focus, increased social involvement); reinforce success. Objective Identify important people in life, past and present, and describe the quality, good and poor, of those relationships. Target Date: 2024-03-23 Frequency: Biweekly  Progress: 40 Modality: individual  Related Interventions Conduct Interpersonal Therapy beginning with the assessment of the client's "interpersonal inventory" of important past and present relationships; develop  a case formulation linking depression to grief, interpersonal role disputes, role transitions,  and/or interpersonal deficits). Objective Learn and implement problem-solving and decision-making skills. Target Date: 2024-03-23 Frequency: Biweekly  Progress: 40 Modality: individual  Related Interventions Conduct Problem-Solving Therapy using techniques such as psychoeducation, modeling, and role-playing to teach client problem-solving skills (i.e., defining a problem specifically, generating possible solutions, evaluating the pros and cons of each solution, selecting and implementing a plan of action, evaluating the efficacy of the plan, accepting or revising the plan); role-play application of the problem-solving skill to a real life issue. Encourage in the client the development of a positive problem orientation in which problems and solving them are viewed as a natural part of life and not something to be feared, despaired, or avoided. 3. Develop healthy interpersonal relationships that lead to the alleviation and help prevent the relapse of depression. 4. Develop healthy thinking patterns and beliefs about self, others, and the world that lead to the alleviation and help prevent the relapse of depression. 5. Recognize, accept, and cope with feelings of depression. Diagnosis F33.1  Conditions For Discharge Achievement of treatment goals and objectives   Salomon Fick, LCSW

## 2023-11-15 NOTE — Progress Notes (Deleted)
Cardiology Clinic Note    Date:  11/15/2023  Patient ID:  Rachel Torres, DOB [birth year], MRN [id number] PCP:  [name], MD  Cardiologist:  Julien Nordmann, MD Electrophysiologist: Lanier Prude, MD  ***refresh   Patient Profile    Chief Complaint: ***  History of Present Illness: Rachel Torres is a 67 y.o. female with PMH notable for parox AFib, type A aortic dissection, s/p AVR, CAD s/p CABG, HFimpEF, OSA, HTN, HLD; seen today for Lanier Prude, MD for routine electrophysiology followup.  She last saw Dr. Lalla Brothers 04/2023 for initial EP consult. She had a TEE [facility] 02/2023 and was maintaining sinus rhythm at that time. They discussed afib treatment, decided on watchful-waiting.  On follow-up today *** AF burden, symptoms *** palpitations *** bleeding concerns   Since last being seen in our clinic the patient reports doing ***.  she denies chest pain, palpitations, dyspnea, PND, orthopnea, nausea, vomiting, dizziness, syncope, edema, weight gain, or early satiety.      AAD History: None     ROS:  Please see the history of present illness. All other systems are reviewed and otherwise negative.    Physical Exam    VS:  There were no vitals taken for this visit. BMI: There is no height or weight on file to calculate BMI.  Wt Readings from Last 3 Encounters:  11/14/23 211 lb (95.7 kg)  10/13/23 208 lb (94.3 kg)  09/15/23 208 lb (94.3 kg)     GEN- The patient is well appearing, alert and oriented x 3 today.   Lungs- Clear to ausculation bilaterally, normal work of breathing.  Heart- {Blank single:19197::"Regular","Irregularly irregular"} rate and rhythm, no murmurs, rubs or gallops Extremities- {EDEMA LEVEL:28147::"No"} peripheral edema, warm, dry    Studies Reviewed   Previous EP, cardiology notes.    EKG is ordered. Personal review of EKG from today shows:  ***       TEE, 03/17/2023 (CE from [facility])   1. There is no thrombus seen in the  left atrium.    2. No evidence for left atrial appendage thrombus.    3. There is no thrombus seen in the right atrium.    4. The Freestyle bio prosthetic aortic valve is well seated.   Cardiac Tele monitoring, 12/24/2022   The patient was monitored for 5 days, 3 hours.   The predominant rhythm was sinus with an average rate of 90 bpm (range 62-126 bpm in sinus).   There were rare PACs and occasional PVCs (PVC burden 1-2%).   There were at least 10 supraventricular runs, lasting up to 15 seconds with a maximum rate of 188 bpm.   There were also 19 episodes of wide-complex tachycardia labeled as nonsustained ventricular tachycardia lasting up to 15 beats with a maximum rate of 188 bpm.  Review of morphologies just that some/all of these episodes may actually represent SVT with aberrancy.   No sustained arrhythmia or prolonged pause was observed.   Patient triggered events correspond to sinus rhythm, PACs, and PVCs.  TTE, 09/23/2022  1. Left ventricular ejection fraction, by estimation, is 45 to 50%. The left ventricle has mildly decreased function. The left ventricle demonstrates global hypokinesis. Left ventricular diastolic parameters are consistent with Grade II diastolic dysfunction (pseudonormalization).   2. Right ventricular systolic function is normal. The right ventricular size is normal.   3. The mitral valve is normal in structure. Mild to moderate mitral valve regurgitation. No evidence of mitral  stenosis.   4. The aortic valve has been repaired/replaced, bioprosthetic valve. Aortic valve regurgitation is not visualized. No aortic stenosis is present. Aortic valve area, by VTI measures 2.28 cm. Aortic valve mean gradient measures 5.0 mmHg.   5. The inferior vena cava is normal in size with greater than 50% respiratory variability, suggesting right atrial pressure of 3 mmHg.   6. There is borderline dilatation of the aortic root, measuring 36 mm.   Comparison(s): 08/02/2019-EF 40-50%.      Assessment and Plan     #) parox AFib  #) Hypercoag d/t parox afib CHA2DS2-VASc Score = 5 [CHF History: 1, HTN History: 1, Diabetes History: 0, Stroke History: 0, Vascular Disease History: 1, Age Score: 1, Gender Score: 1].  Therefore, the patient's annual risk of stroke is 7.2 %.     {Confirm score is correct.  If not, click here to update score.  REFRESH note.  :1}   Stroke ppx - 5mg  eliquis BID, appropriately dosed No bleeding concerns    #) HFimpEF #) CAD   {Are you ordering a CV Procedure (e.g. stress test, cath, DCCV, TEE, etc)?   Press F2        :[id number]}   Current medicines are reviewed at length with the patient today.   The patient {ACTIONS; HAS/DOES NOT HAVE:19233} concerns regarding her medicines.  The following changes were made today:  {NONE DEFAULTED:18576}  Labs/ tests ordered today include: *** No orders of the defined types were placed in this encounter.    Disposition: Follow up with {EPMDS:28135} or EP APP {EPFOLLOW UP:28173}   Signed, Sherie Don, NP  11/15/23  4:07 PM  Electrophysiology [facility]

## 2023-11-16 ENCOUNTER — Ambulatory Visit: Payer: Medicare HMO | Attending: Cardiology | Admitting: Cardiology

## 2023-11-16 DIAGNOSIS — I480 Paroxysmal atrial fibrillation: Secondary | ICD-10-CM

## 2023-11-16 DIAGNOSIS — J208 Acute bronchitis due to other specified organisms: Secondary | ICD-10-CM | POA: Diagnosis not present

## 2023-11-16 DIAGNOSIS — D6869 Other thrombophilia: Secondary | ICD-10-CM

## 2023-11-16 DIAGNOSIS — R0602 Shortness of breath: Secondary | ICD-10-CM | POA: Diagnosis not present

## 2023-11-16 DIAGNOSIS — I5032 Chronic diastolic (congestive) heart failure: Secondary | ICD-10-CM

## 2023-11-16 DIAGNOSIS — B9689 Other specified bacterial agents as the cause of diseases classified elsewhere: Secondary | ICD-10-CM | POA: Diagnosis not present

## 2023-11-22 ENCOUNTER — Ambulatory Visit (INDEPENDENT_AMBULATORY_CARE_PROVIDER_SITE_OTHER): Payer: Medicare HMO | Admitting: Family Medicine

## 2023-11-22 ENCOUNTER — Ambulatory Visit: Payer: Medicare HMO | Admitting: Psychology

## 2023-11-22 ENCOUNTER — Encounter: Payer: Self-pay | Admitting: Family Medicine

## 2023-11-22 ENCOUNTER — Ambulatory Visit: Payer: Medicare HMO

## 2023-11-22 VITALS — BP 128/66 | HR 91 | Temp 98.9°F | Ht 65.0 in | Wt 216.2 lb

## 2023-11-22 DIAGNOSIS — J209 Acute bronchitis, unspecified: Secondary | ICD-10-CM | POA: Diagnosis not present

## 2023-11-22 DIAGNOSIS — J4520 Mild intermittent asthma, uncomplicated: Secondary | ICD-10-CM | POA: Diagnosis not present

## 2023-11-22 DIAGNOSIS — F331 Major depressive disorder, recurrent, moderate: Secondary | ICD-10-CM

## 2023-11-22 MED ORDER — ALBUTEROL SULFATE HFA 108 (90 BASE) MCG/ACT IN AERS: 1.0000 | INHALATION_SPRAY | Freq: Four times a day (QID) | RESPIRATORY_TRACT | 3 refills | Status: DC | PRN

## 2023-11-22 MED ORDER — PROMETHAZINE-DM 6.25-15 MG/5ML PO SYRP: 5.0000 mL | ORAL_SOLUTION | Freq: Three times a day (TID) | ORAL | 1 refills | Status: DC | PRN

## 2023-11-22 MED ORDER — METHYLPREDNISOLONE ACETATE 40 MG/ML IJ SUSP
40.0000 mg | Freq: Once | INTRAMUSCULAR | Status: AC
Start: 2023-11-22 — End: 2023-11-22
  Administered 2023-11-22: 40 mg via INTRAMUSCULAR

## 2023-11-22 NOTE — Patient Instructions (Addendum)
Try prometh dm for cough -caution of sedation  Use mucinex if you need to move congestion  Drink fluids  Tessalon pills are good also for cough  Depo medrol shot today 40 mg   Update if not starting to improve in a week or if worsening   If any severe symptoms/trouble breathing, go to the ER

## 2023-11-22 NOTE — Progress Notes (Signed)
Subjective:    Patient ID: Rachel Torres, female    DOB: 09-Feb-1956, 67 y.o.   MRN: [id number]  HPI  Wt Readings from Last 3 Encounters:  11/22/23 216 lb 4 oz (98.1 kg)  11/14/23 211 lb (95.7 kg)  10/13/23 208 lb (94.3 kg)   35.99 kg/m  Vitals:   11/22/23 1155  BP: 128/66  Pulse: 91  Temp: 98.9 F (37.2 C)  SpO2: 95%    Pt presents with symptoms of bronchitis    Had covid in early November -went to [facility] on [facility]  Gave her prometh dm  Back there last Wednesday  Xray -no pneumonia  Then went to [facility] and was dx with bronchitis Prescription doxycycline  Tessalon   Cxr report: Reading, Radiology - 11/16/2023  Formatting of this note might be different from the original.  Examination  Description: Chest xray, frontal and lateral views   Comparisons:  None provided.    Findings  Post surgical changes are noted in the chest.   No focal consolidation identified.   No pleural effusions are seen.   No pneumothorax is seen.   No acute appearing soft tissue or osseous abnormality.   IMPRESSION:   No focal consolidation or effusion.   Over the counter  Mucinex dm   Cough - dry now / was productive of yellow to clear  Some wheezing and tightness   Albuterol mdi few times per day   Allergic to prednisone- rash Can take methylprednisolone    Patient Active Problem List   Diagnosis Date Noted   Acute bronchitis 11/22/2023   Abnormal metabolism 11/14/2023   Weight regain after inital weight loss 11/14/2023   Multiple closed tarsal fractures of left foot, sequela 07/12/2023   Arm contusion, right, sequela 07/12/2023   Lumbar disc disease 07/12/2023   Atrial fibrillation (HCC) 06/09/2023   Paresthesia 06/03/2023   BMI 34.0-34.9,adult 02/17/2023   Obesity, Beginning BMI 35.45 02/17/2023   Tinnitus 01/14/2023   Change in vision 01/14/2023   Colon cancer screening 01/14/2023   Personal history of fall 01/14/2023   Problem related to unspecified  psychosocial circumstances 12/28/2022   Person encountering health services to consult on behalf of another person 12/28/2022   Palpitation 12/10/2022   Fatigue 10/26/2022   B12 deficiency 10/26/2022   Right shoulder pain 09/15/2022   Depression 09/14/2022   Class 2 severe obesity with serious comorbidity and body mass index (BMI) of 35.0 to 35.9 in adult [facility]) 09/14/2022   Other constipation 05/12/2022   Medicare annual wellness visit, initial 07/17/2021   TMJ (dislocation of temporomandibular joint) 07/17/2021   Encounter for screening mammogram for breast cancer 06/05/2021   Diarrhea 04/11/2020   Epigastric pain 02/19/2020   Dark stools 02/19/2020   History of atrial fibrillation 02/05/2019   Shortness of breath 01/27/2019   Irregular surface of cornea 12/25/2018   HPV (human papilloma virus) infection 12/01/2016   Screening mammogram, encounter for 11/29/2016   Estrogen deficiency 11/29/2016   Encounter for routine gynecological examination 11/29/2016   Grade III hemorrhoids 10/06/2016   Tachycardia 08/14/2016   Hemorrhoids 08/13/2016   Atypical migraine 08/03/2016   CAD (coronary artery disease)    AAA (abdominal aortic aneurysm) (HCC)    Chronic combined systolic (congestive) and diastolic (congestive) heart failure (HCC)    Prediabetes 02/11/2016   Generalized anxiety disorder 12/08/2015   Panic attacks 07/28/2015   Chest pain 07/09/2013   Sensorineural hearing loss 03/27/2013   Vitamin D deficiency 07/03/2012   H/O  aortic dissection 05/12/2012   Routine general medical examination at a health care facility 09/20/2011   Atypical chest pain 09/15/2011   S/P AVR (aortic valve replacement) 01/07/2011   CORONARY ARTERY BYPASS GRAFT, HX OF 01/07/2011   Arthritis 12/20/2010   Back pain 12/15/2010   H/O dissecting abdominal aortic aneurysm repair 08/24/2010   IRRITABLE BOWEL SYNDROME 09/09/2009   Obstructive sleep apnea 08/04/2009   External hemorrhoid 06/26/2009    Osteoporosis 01/09/2009   Hyperlipidemia 07/26/2007   Depression with anxiety 07/26/2007   Essential hypertension 07/26/2007   Allergic rhinitis 07/26/2007   Asthma 07/26/2007   GERD 07/26/2007   Osteoarthritis 07/26/2007   Ocular migraine 07/26/2007   Mild intermittent asthma without complication 07/26/2007   Past Medical History:  Diagnosis Date   AAA (abdominal aortic aneurysm) (HCC)    a. Chronic w/o evidence of aneurysmal dil on CTA 01/2016.   Alcohol abuse    Allergy    Anemia    Anxiety    Asthma    CAD (coronary artery disease)    a. 08/2010 s/p CABG x 1 (VG->RCA) @ time of Ao dissection repair; b. 01/2016 Lexiscan MV: mid antsept/apical defect w/ ? peri-infarct ischemia-->likely attenuation-->Med Rx.   Chewing difficulty    Chronic combined systolic (congestive) and diastolic (congestive) heart failure (HCC)    a. 2012 EF 30-35%; b. 04/2015 EF 40-45%; c. 01/2016 Echo: Ef 55-65%, nl AoV bioprosthesis, nl RV, nl PASP.   Constipation    Depression    Edema of both lower extremities    Gallbladder sludge    GERD (gastroesophageal reflux disease)    Heart failure (HCC)    Heart valve problem    History of Bicuspid Aortic Valve    a. 08/2010 s/p AVR @ time of Ao dissection repair; b. 01/2016 Echo: Ef 55-65%, nl AoV bioprosthesis, nl RV, nl PASP.   Hx of repair of dissecting thoracic aortic aneurysm, Stanford type A    a. 08/2010 s/p repair with AVR and VG->RCA; b. 01/2016 CTA: stable appearance of Asc Thoracic Aortic graft. Opacification of flase lumen of chronic abd Ao dissection w/ retrograde flow through lumbar arteries. No aneurysmal dil of flase lumen.   Hyperlipidemia    Hypertension    Hypertensive heart disease    IBS (irritable bowel syndrome)    Internal hemorrhoids    Kidney problem    Marfan syndrome    pt denies   Migraine    Obstructive sleep apnea    Osteoarthritis    Osteoporosis    Palpitations    Pneumonia    RA (rheumatoid arthritis) (HCC)    a.  Followed by Dr. Mallie Mussel   Past Surgical History:  Procedure Laterality Date   ABDOMINAL AORTIC ANEURYSM REPAIR     CARDIAC CATHETERIZATION  2001   CESAREAN SECTION  1986 & 1991   CHOLECYSTECTOMY     COLONOSCOPY     ESOPHAGOGASTRODUODENOSCOPY (EGD) WITH PROPOFOL N/A 05/08/2020   Procedure: ESOPHAGOGASTRODUODENOSCOPY (EGD) WITH PROPOFOL;  Surgeon: Pasty Spillers, MD;  Location: [facility] ENDOSCOPY;  Service: Endoscopy;  Laterality: N/A;   FRACTURE SURGERY Right    shoulder replacement   HEMORRHOID BANDING     ORIF DISTAL RADIUS FRACTURE Left    UPPER GASTROINTESTINAL ENDOSCOPY     Social History   Tobacco Use   Smoking status: Former    Current packs/day: 0.00    Average packs/day: 0.8 packs/day for 1.5 years (1.1 ttl pk-yrs)    Types: Cigarettes    Start date:  06/20/1977    Quit date: 12/20/1978    Years since quitting: 44.9   Smokeless tobacco: Never  Vaping Use   Vaping status: Never Used  Substance Use Topics   Alcohol use: Yes    Comment: 2-3 glasses of red wine per month   Drug use: No   Family History  Problem Relation Age of Onset   Hypertension Mother    Depression Mother    Osteoporosis Mother    Stroke Mother    Irritable bowel syndrome Mother    Asthma Mother    Heart disease Mother    Thyroid disease Mother    Anxiety disorder Mother    Other Father 74   Arthritis Sister    Diabetes Sister    Heart disease Sister    Asthma Sister    Migraines Sister    Diabetes Sister    Nephrolithiasis Sister    Osteoporosis Sister    Arthritis Sister    Asthma Sister    Migraines Sister    Stroke Maternal Grandmother    Colon cancer Maternal Grandmother    Lymphoma Maternal Grandmother    Migraines Maternal Grandmother    Lung cancer Maternal Grandfather    Melanoma Maternal Grandfather    Aneurysm Paternal Grandmother        brain   Diabetes Paternal Grandmother    Arthritis Paternal Grandmother    Arthritis Paternal Grandfather    Diabetes Paternal  Grandfather    Diabetes Brother    Other Brother        prediabeties   Asthma Brother    Migraines Brother    Breast cancer Neg Hx    Allergies  Allergen Reactions   Fosamax [Alendronate Sodium]     GI upset    Ginzing [Ginseng] Other (See Comments)    Hyper and jitery    Hydrocod Poli-Chlorphe Poli Er Itching   Hydrocodone-Acetaminophen Itching   Imitrex [Sumatriptan]     Contraindicated w/ heart condition   Oxycodone Itching   Peanut-Containing Drug Products Other (See Comments)    Reaction:  Migraines    Tramadol Other (See Comments)    itching   Hydrocodone-Guaifenesin Itching   Prednisone Rash   Tape Itching   Current Outpatient Medications on File Prior to Visit  Medication Sig Dispense Refill   acetaminophen (TYLENOL) 500 MG tablet Take 500 mg by mouth every 6 (six) hours as needed.     ALPRAZolam (XANAX) 0.5 MG tablet Take 1 tablet (0.5 mg total) by mouth daily as needed for anxiety. 30 tablet 0   aspirin EC 81 MG tablet Take 81 mg by mouth at bedtime.     atorvastatin (LIPITOR) 20 MG tablet Take 20 mg by mouth. Four days per week     benzonatate (TESSALON) 200 MG capsule Take 200 mg by mouth 3 (three) times daily as needed.     Calcium Carbonate (CALCIUM 600 PO) Take 600 mg by mouth.     cetirizine (ZYRTEC) 10 MG tablet Take 1 tablet (10 mg total) by mouth daily as needed for allergies. 90 tablet 3   Cholecalciferol (VITAMIN D3) 50 MCG (2000 UT) CAPS Take 2,000 capsules by mouth. FOUR TIMES A WEEK     cyclobenzaprine (FLEXERIL) 10 MG tablet Take 0.5-1 tablets (5-10 mg total) by mouth 3 (three) times daily as needed. For headache 90 tablet 3   dextromethorphan-guaiFENesin (MUCINEX DM) 30-600 MG 12hr tablet Take 1 tablet by mouth 2 (two) times daily.     doxycycline (ADOXA)  100 MG tablet Take 100 mg by mouth 2 (two) times daily.     ELIQUIS 5 MG TABS tablet Take 1 tablet (5 mg total) by mouth 2 (two) times daily. 180 tablet 3   ezetimibe (ZETIA) 10 MG tablet TAKE 1  TABLET EVERY DAY 90 tablet 3   fluticasone (FLONASE) 50 MCG/ACT nasal spray Place 2 sprays into both nostrils daily as needed for allergies or rhinitis. 48 g 3   furosemide (LASIX) 20 MG tablet Take 2 tablets (40 mg total) by mouth daily. 180 tablet 3   hydrOXYzine (ATARAX) 25 MG tablet Take 25 mg by mouth 3 (three) times daily.     isosorbide mononitrate (IMDUR) 30 MG 24 hr tablet TAKE 1/2 TABLET EVERY DAY 45 tablet 3   lisinopril (ZESTRIL) 5 MG tablet Take 0.5 tablets (2.5 mg total) by mouth daily. 90 tablet 3   magnesium gluconate (MAGONATE) 500 MG tablet Take 500 mg by mouth at bedtime.     Melatonin 5 MG SUBL Take 10 mg by mouth at bedtime as needed.     nitroGLYCERIN (NITROSTAT) 0.4 MG SL tablet Place 1 tablet (0.4 mg total) under the tongue every 5 (five) minutes as needed for chest pain. 25 tablet 3   omeprazole (PRILOSEC) 40 MG capsule TAKE 1 CAPSULE EVERY DAY 90 capsule 2   polyethylene glycol powder (GLYCOLAX/MIRALAX) 17 GM/SCOOP powder Take 17 g by mouth as needed. 255 g 0   potassium chloride (KLOR-CON M) 10 MEQ tablet Take 1 tablet (10 mEq total) by mouth 2 (two) times daily. 180 tablet 3   promethazine (PHENERGAN) 25 MG tablet Take 0.5 tablets (12.5 mg total) by mouth every 8 (eight) hours as needed for nausea. 90 tablet 3   TART CHERRY PO Take 1 capsule by mouth 2 (two) times daily.     topiramate (TOPAMAX) 50 MG tablet Take 1 tablet (50 mg total) by mouth at bedtime. 30 tablet 1   venlafaxine XR (EFFEXOR-XR) 150 MG 24 hr capsule TAKE 1 CAPSULE EVERY DAY WITH BREAKFAST 90 capsule 2   venlafaxine XR (EFFEXOR-XR) 37.5 MG 24 hr capsule Take 1 capsule (37.5 mg total) by mouth daily with breakfast. 90 capsule 2   carvedilol (COREG) 12.5 MG tablet Take 1 tablet (12.5 mg total) by mouth 2 (two) times daily. 180 tablet 3   No current facility-administered medications on file prior to visit.    Review of Systems  Constitutional:  Negative for activity change, appetite change, fatigue,  fever and unexpected weight change.  HENT:  Positive for congestion and sinus pressure. Negative for ear pain, rhinorrhea and sore throat.   Eyes:  Negative for pain, redness and visual disturbance.  Respiratory:  Positive for cough and wheezing. Negative for shortness of breath and stridor.   Cardiovascular:  Negative for chest pain and palpitations.  Gastrointestinal:  Negative for abdominal pain, blood in stool, constipation and diarrhea.  Endocrine: Negative for polydipsia and polyuria.  Genitourinary:  Negative for dysuria, frequency and urgency.  Musculoskeletal:  Negative for arthralgias, back pain and myalgias.  Skin:  Negative for pallor and rash.  Allergic/Immunologic: Negative for environmental allergies.  Neurological:  Positive for headaches. Negative for dizziness and syncope.  Hematological:  Negative for adenopathy. Does not bruise/bleed easily.  Psychiatric/Behavioral:  Negative for decreased concentration and dysphoric mood. The patient is not nervous/anxious.        Objective:   Physical Exam Constitutional:      General: She is not in acute distress.  Appearance: Normal appearance. She is well-developed. She is not ill-appearing, toxic-appearing or diaphoretic.  HENT:     Head: Normocephalic and atraumatic.     Comments: Nares are injected and congested      Right Ear: Tympanic membrane, ear canal and external ear normal.     Left Ear: Tympanic membrane, ear canal and external ear normal.     Nose: Congestion and rhinorrhea present.     Mouth/Throat:     Mouth: Mucous membranes are moist.     Pharynx: Oropharynx is clear. No oropharyngeal exudate or posterior oropharyngeal erythema.     Comments: Clear pnd  Eyes:     General:        Right eye: No discharge.        Left eye: No discharge.     Conjunctiva/sclera: Conjunctivae normal.     Pupils: Pupils are equal, round, and reactive to light.  Cardiovascular:     Rate and Rhythm: Normal rate.     Heart  sounds: Normal heart sounds.  Pulmonary:     Effort: Pulmonary effort is normal. No respiratory distress.     Breath sounds: No stridor. Rhonchi present. No wheezing or rales.     Comments: Scattered rhonchi Scant wheeze on forced exp only No crackles or rales  Chest:     Chest wall: No tenderness.  Musculoskeletal:     Cervical back: Normal range of motion and neck supple.  Lymphadenopathy:     Cervical: No cervical adenopathy.  Skin:    General: Skin is warm and dry.     Capillary Refill: Capillary refill takes less than 2 seconds.     Findings: No rash.  Neurological:     Mental Status: She is alert.     Cranial Nerves: No cranial nerve deficit.  Psychiatric:        Mood and Affect: Mood normal.           Assessment & Plan:   Problem List Items Addressed This Visit       Respiratory   Mild intermittent asthma without complication   Relevant Medications   albuterol (VENTOLIN HFA) 108 (90 Base) MCG/ACT inhaler   Acute bronchitis - Primary    S/p covid 19 in early November  Now with cyclic cough and some wheezing Reviewed notes and cxr report from [facility] on 11/3 and 11/27  Finishing doxycycline Reassuring exam today Prescription prometh dm to help break cough cycle  Solu medrol 40 mg IM times one today (can take this but not prednisone)  See AVS for symptoms care  Mucinex plain if needed Refilled albuterol mdi with spacer   Update if not starting to improve in a week or if worsening  Call back and Er precautions noted in detail today

## 2023-11-22 NOTE — Assessment & Plan Note (Signed)
S/p covid 19 in early November  Now with cyclic cough and some wheezing Reviewed notes and cxr report from [facility] on 11/3 and 11/27  Finishing doxycycline Reassuring exam today Prescription prometh dm to help break cough cycle  Solu medrol 40 mg IM times one today (can take this but not prednisone)  See AVS for symptoms care  Mucinex plain if needed Refilled albuterol mdi with spacer   Update if not starting to improve in a week or if worsening  Call back and Er precautions noted in detail today

## 2023-11-22 NOTE — Progress Notes (Signed)
[facility] Counselor/Therapist Progress Note  Patient ID: Rachel Torres, MRN: [id number],    Date: 11/22/2023  Time Spent: 10:00am-10:55am  55 minutes   Treatment Type: Individual Therapy  Reported Symptoms: stress  Mental Status Exam: Appearance:  Casual     Behavior: Appropriate  Motor: Normal  Speech/Language:  Normal Rate  Affect: Appropriate  Mood: normal  Thought process: normal  Thought content:   WNL  Sensory/Perceptual disturbances:   WNL  Orientation: oriented to person, place, time/date, and situation  Attention: Good  Concentration: Good  Memory: WNL  Fund of knowledge:  Good  Insight:   Good  Judgment:  Good  Impulse Control: Good   Risk Assessment: Danger to Self:  No Self-injurious Behavior: No Danger to Others: No Duty to Warn:no Physical Aggression / Violence:No  Access to Firearms a concern: No  Gang Involvement:No   Subjective: Pt present for face-to-face individual therapy via video.  Pt consents to telehealth video session and is aware of limitations and benefits of virtual sessions.  Location of pt: home Location of therapist: home office.  Pt talked about preparing for Rachel Torres's birthday party this weekend.   Pt is stressed bc she is the main person doing all the planning and work.   Pt is also still not feeling well physically.   Addressed pt's stress and worked on Opt[employer], dispensing.  Pt talked about Rachel husband Rachel Torres.  [facility].  He will go Dec. 15th -23rd and then for another 3 weeks in January.  This will be helpful during the kitchen renovation.   Pt talked about thanksgiving.   Pt was frustrated with Rachel Torres bc she tried to cancel.   Addressed the issues with pt's Torres. Helped pt process Rachel feelings.   Worked on self care strategies.  Provided supportive therapy.  Interventions: Cognitive Behavioral Therapy and Insight-Oriented  Diagnosis:  F33.1  Plan of Care:  Recommend ongoing therapy.   Pt participated in setting treatment goals.   Pt needs a place to talk about stressors.  Plan to meet every two weeks.    Treatment Plan (target date: 03/23/2024) Client Abilities/Strengths  Pt is bright, engaging, and motivated for therapy.   Client Treatment Preferences  Individual therapy.  Client Statement of Needs  Improve coping skills.  Have a place to talk about stressors.   Symptoms  Depressed or irritable mood. Unresolved grief issues.  Problems Addressed  Unipolar Depression Goals 1. Alleviate depressive symptoms and return to previous level of effective functioning. 2. Appropriately grieve the loss in order to normalize mood and to return to previously adaptive level of functioning. Objective Learn and implement behavioral strategies to overcome depression. Target Date: 2024-03-23 Frequency: Biweekly  Progress: 40 Modality: individual  Related Interventions Engage the client in "behavioral activation," increasing his/Rachel activity level and contact with sources of reward, while identifying processes that inhibit activation.  Use behavioral techniques such as instruction, rehearsal, role-playing, role reversal, as needed, to facilitate activity in the client's daily life; reinforce success. Assist the client in developing skills that increase the likelihood of deriving pleasure from behavioral activation (e.g., assertiveness skills, developing an exercise plan, less internal/more external focus, increased social involvement); reinforce success. Objective Identify important people in life, past and present, and describe the quality, good and poor, of those relationships. Target Date: 2024-03-23 Frequency: Biweekly  Progress: 40 Modality: individual  Related Interventions Conduct Interpersonal Therapy beginning with the assessment of  the client's "interpersonal inventory" of important past and present relationships; develop a case formulation linking  depression to grief, interpersonal role disputes, role transitions, and/or interpersonal deficits). Objective Learn and implement problem-solving and decision-making skills. Target Date: 2024-03-23 Frequency: Biweekly  Progress: 40 Modality: individual  Related Interventions Conduct Problem-Solving Therapy using techniques such as psychoeducation, modeling, and role-playing to teach client problem-solving skills (i.e., defining a problem specifically, generating possible solutions, evaluating the pros and cons of each solution, selecting and implementing a plan of action, evaluating the efficacy of the plan, accepting or revising the plan); role-play application of the problem-solving skill to a real life issue. Encourage in the client the development of a positive problem orientation in which problems and solving them are viewed as a natural part of life and not something to be feared, despaired, or avoided. 3. Develop healthy interpersonal relationships that lead to the alleviation and help prevent the relapse of depression. 4. Develop healthy thinking patterns and beliefs about self, others, and the world that lead to the alleviation and help prevent the relapse of depression. 5. Recognize, accept, and cope with feelings of depression. Diagnosis F33.1  Conditions For Discharge Achievement of treatment goals and objectives   Salomon Fick, LCSW

## 2023-11-24 ENCOUNTER — Telehealth: Payer: Self-pay | Admitting: *Deleted

## 2023-11-24 ENCOUNTER — Encounter: Payer: Self-pay | Admitting: Family Medicine

## 2023-11-24 MED ORDER — METHYLPREDNISOLONE 4 MG PO TBPK: ORAL_TABLET | ORAL | 0 refills | Status: DC

## 2023-11-24 NOTE — Telephone Encounter (Signed)
Pt notified of Dr. Royden Purl comments and instructions. Pt said she does have the tessalon pills already and is taking them. Pt will try the medrol dose pack and update [place] if no improvement or if sxs worsen

## 2023-11-24 NOTE — Telephone Encounter (Signed)
Addressed through phone note 

## 2023-11-24 NOTE — Telephone Encounter (Signed)
I sent in a medrol dose pack  I was going to send tessalon but it is already on med list- is she taking that?  Is it helping?   Does she need refill   This will take a while to improve but I want to know if she is getting worse

## 2023-11-24 NOTE — Telephone Encounter (Signed)
Pt sent a message saying:   My cough is still not getting better. I was unable to get to sleep last night because of the coughing. Do I need to take additional steroids to get this under control?

## 2023-11-29 ENCOUNTER — Telehealth: Payer: Self-pay | Admitting: Cardiovascular Disease

## 2023-11-29 ENCOUNTER — Ambulatory Visit: Payer: Medicare HMO | Attending: Orthopedic Surgery

## 2023-11-29 ENCOUNTER — Ambulatory Visit: Payer: Medicare HMO | Admitting: Psychology

## 2023-11-29 DIAGNOSIS — M6281 Muscle weakness (generalized): Secondary | ICD-10-CM | POA: Diagnosis not present

## 2023-11-29 DIAGNOSIS — M5459 Other low back pain: Secondary | ICD-10-CM

## 2023-11-29 DIAGNOSIS — M25511 Pain in right shoulder: Secondary | ICD-10-CM | POA: Insufficient documentation

## 2023-11-29 DIAGNOSIS — R2689 Other abnormalities of gait and mobility: Secondary | ICD-10-CM | POA: Diagnosis not present

## 2023-11-29 DIAGNOSIS — F331 Major depressive disorder, recurrent, moderate: Secondary | ICD-10-CM

## 2023-11-29 DIAGNOSIS — G8929 Other chronic pain: Secondary | ICD-10-CM

## 2023-11-29 DIAGNOSIS — M25512 Pain in left shoulder: Secondary | ICD-10-CM | POA: Diagnosis not present

## 2023-11-29 DIAGNOSIS — R278 Other lack of coordination: Secondary | ICD-10-CM | POA: Insufficient documentation

## 2023-11-29 DIAGNOSIS — R293 Abnormal posture: Secondary | ICD-10-CM

## 2023-11-29 DIAGNOSIS — M25612 Stiffness of left shoulder, not elsewhere classified: Secondary | ICD-10-CM | POA: Diagnosis not present

## 2023-11-29 NOTE — Therapy (Signed)
OUTPATIENT PHYSICAL THERAPY TREATMENT/RECERT   Patient Name: Rachel Torres MRN: [id number] DOB:Feb 02, 1956, 67 y.o., female Today's Date: 11/29/2023  END OF SESSION:  PT End of Session - 11/29/23 1703     Visit Number 13    Number of Visits 29    Date for PT Re-Evaluation 01/24/24    Authorization Type [employer]  Medicare HMO    Authorization Time Period 09/05/23-10/31/23    Progress Note Due on Visit 10    PT Start Time 1400    PT Stop Time 1444    PT Time Calculation (min) 44 min    Equipment Utilized During Treatment Gait belt    Activity Tolerance Patient tolerated treatment well    Behavior During Therapy WFL for tasks assessed/performed                    Past Medical History:  Diagnosis Date   AAA (abdominal aortic aneurysm) (HCC)    a. Chronic w/o evidence of aneurysmal dil on CTA 01/2016.   Alcohol abuse    Allergy    Anemia    Anxiety    Asthma    CAD (coronary artery disease)    a. 08/2010 s/p CABG x 1 (VG->RCA) @ time of Ao dissection repair; b. 01/2016 Lexiscan MV: mid antsept/apical defect w/ ? peri-infarct ischemia-->likely attenuation-->Med Rx.   Chewing difficulty    Chronic combined systolic (congestive) and diastolic (congestive) heart failure (HCC)    a. 2012 EF 30-35%; b. 04/2015 EF 40-45%; c. 01/2016 Echo: Ef 55-65%, nl AoV bioprosthesis, nl RV, nl PASP.   Constipation    Depression    Edema of both lower extremities    Gallbladder sludge    GERD (gastroesophageal reflux disease)    Heart failure (HCC)    Heart valve problem    History of Bicuspid Aortic Valve    a. 08/2010 s/p AVR @ time of Ao dissection repair; b. 01/2016 Echo: Ef 55-65%, nl AoV bioprosthesis, nl RV, nl PASP.   Hx of repair of dissecting thoracic aortic aneurysm, Stanford type A    a. 08/2010 s/p repair with AVR and VG->RCA; b. 01/2016 CTA: stable appearance of Asc Thoracic Aortic graft. Opacification of flase lumen of chronic abd Ao dissection w/ retrograde flow through  lumbar arteries. No aneurysmal dil of flase lumen.   Hyperlipidemia    Hypertension    Hypertensive heart disease    IBS (irritable bowel syndrome)    Internal hemorrhoids    Kidney problem    Marfan syndrome    pt denies   Migraine    Obstructive sleep apnea    Osteoarthritis    Osteoporosis    Palpitations    Pneumonia    RA (rheumatoid arthritis) (HCC)    a. Followed by Dr. Mallie Mussel   Past Surgical History:  Procedure Laterality Date   ABDOMINAL AORTIC ANEURYSM REPAIR     CARDIAC CATHETERIZATION  2001   CESAREAN SECTION  1986 & 1991   CHOLECYSTECTOMY     COLONOSCOPY     ESOPHAGOGASTRODUODENOSCOPY (EGD) WITH PROPOFOL N/A 05/08/2020   Procedure: ESOPHAGOGASTRODUODENOSCOPY (EGD) WITH PROPOFOL;  Surgeon: Pasty Spillers, MD;  Location: [facility] ENDOSCOPY;  Service: Endoscopy;  Laterality: N/A;   FRACTURE SURGERY Right    shoulder replacement   HEMORRHOID BANDING     ORIF DISTAL RADIUS FRACTURE Left    UPPER GASTROINTESTINAL ENDOSCOPY     Patient Active Problem List   Diagnosis Date Noted   Acute bronchitis 11/22/2023  Abnormal metabolism 11/14/2023   Weight regain after inital weight loss 11/14/2023   Multiple closed tarsal fractures of left foot, sequela 07/12/2023   Arm contusion, right, sequela 07/12/2023   Lumbar disc disease 07/12/2023   Atrial fibrillation (HCC) 06/09/2023   Paresthesia 06/03/2023   BMI 34.0-34.9,adult 02/17/2023   Obesity, Beginning BMI 35.45 02/17/2023   Tinnitus 01/14/2023   Change in vision 01/14/2023   Colon cancer screening 01/14/2023   Personal history of fall 01/14/2023   Problem related to unspecified psychosocial circumstances 12/28/2022   Person encountering health services to consult on behalf of another person 12/28/2022   Palpitation 12/10/2022   Fatigue 10/26/2022   B12 deficiency 10/26/2022   Right shoulder pain 09/15/2022   Depression 09/14/2022   Class 2 severe obesity with serious comorbidity and body mass index (BMI) of  35.0 to 35.9 in adult [facility]) 09/14/2022   Other constipation 05/12/2022   Medicare annual wellness visit, initial 07/17/2021   TMJ (dislocation of temporomandibular joint) 07/17/2021   Encounter for screening mammogram for breast cancer 06/05/2021   Diarrhea 04/11/2020   Epigastric pain 02/19/2020   Dark stools 02/19/2020   History of atrial fibrillation 02/05/2019   Shortness of breath 01/27/2019   Irregular surface of cornea 12/25/2018   HPV (human papilloma virus) infection 12/01/2016   Screening mammogram, encounter for 11/29/2016   Estrogen deficiency 11/29/2016   Encounter for routine gynecological examination 11/29/2016   Grade III hemorrhoids 10/06/2016   Tachycardia 08/14/2016   Hemorrhoids 08/13/2016   Atypical migraine 08/03/2016   CAD (coronary artery disease)    AAA (abdominal aortic aneurysm) (HCC)    Chronic combined systolic (congestive) and diastolic (congestive) heart failure (HCC)    Prediabetes 02/11/2016   Generalized anxiety disorder 12/08/2015   Panic attacks 07/28/2015   Chest pain 07/09/2013   Sensorineural hearing loss 03/27/2013   Vitamin D deficiency 07/03/2012   H/O aortic dissection 05/12/2012   Routine general medical examination at a health care facility 09/20/2011   Atypical chest pain 09/15/2011   S/P AVR (aortic valve replacement) 01/07/2011   CORONARY ARTERY BYPASS GRAFT, HX OF 01/07/2011   Arthritis 12/20/2010   Back pain 12/15/2010   H/O dissecting abdominal aortic aneurysm repair 08/24/2010   IRRITABLE BOWEL SYNDROME 09/09/2009   Obstructive sleep apnea 08/04/2009   External hemorrhoid 06/26/2009   Osteoporosis 01/09/2009   Hyperlipidemia 07/26/2007   Depression with anxiety 07/26/2007   Essential hypertension 07/26/2007   Allergic rhinitis 07/26/2007   Asthma 07/26/2007   GERD 07/26/2007   Osteoarthritis 07/26/2007   Ocular migraine 07/26/2007   Mild intermittent asthma without complication 07/26/2007    PCP: [name] MD    REFERRING PROVIDER: Stormy Card  REFERRING DIAG: L rotator cuff   THERAPY DIAG:  Left shoulder pain, unspecified chronicity  Stiffness of left shoulder, not elsewhere classified  Abnormal posture  Muscle weakness (generalized)  Other low back pain  Chronic right shoulder pain  Other abnormalities of gait and mobility  Rationale for Evaluation and Treatment: Rehabilitation  ONSET DATE: Feb 2024  SUBJECTIVE:  SUBJECTIVE STATEMENT:  Pt presents after > 1 month hiatus due to personal circumstances and not feeling well. Patient still feeling under the weather with bronchitis impacting her ability to function. Pt celebrated her mother's 85th birthday over the weekend where she was at a party and on her feet the whole time. Pt in 6/10 left ankle pain after prolonged standing. Left shoulder pain a 4/10 and right shoulder pain 0/10. Pt states she has not had any falls since last visit.       Hand dominance: Left  PERTINENT HISTORY: Lorissa Guaderrama is a 66yoF familiar to our services. Pt referred to PT for strengthening of chronic left shoulder DJD, known remote RC injury. Recently fell and broke four metatarsals, since cleared from use of CAM rocker. She was treated successfully in past for right shoulder pain and has past medical history of Right shoulder surgery replacement. 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 PMH aorta type A dissection in sept 2011 s/p aortic valve replacement, bypass of R coronary artery, anemia, anxiety, CAD, CHF, depression, GERD, Heart failure, HLD, HTN, IBS, migraine, osteoarthritis, osteoporosis, RA, back pain.    Back pain:  Patient has new referral for radiculopathy, lumbar region. MRI showed degeneration of L1-2 and L2-3 with some disc space collapse and minor  retrolisthesis of L2-3. Issue began in February, felt numbness in L thigh. No cause known, but she was at a museum and noticed it was numb. Sometimes L foot numbness and toe numbness of 2nd and 3rd toe causing tripping. This caused her to trip July 4th and break five metatarsals. Some minor pain in the low pain noted. Pain noted to be 2-3/10 Pain in hip 3-4/10 of L hip.    PAIN:  Are you having pain? Yes, 3/10 pain in lateral deltoids in the shoulder.  PRECAUTIONS: None  FALLS:  Has patient fallen in last 6 months? Yes. Number of falls 2  PATIENT GOALS:to lift more than 3lb with LUE, be able to eat a meal without arm being tired.    OBJECTIVE:  DIAGNOSTIC FINDINGS:  IMPRESSION: Mild degenerative change in the cervical spine without acute fracture or traumatic subluxation.   IMPRESSION: Full width, full width tears of the supraspinatus and infraspinatus tendons with retraction to the glenoid. Grade 2/3 infraspinatus atrophy.   Moderate tendinosis of the distal subscapularis tendon with articular sided fraying of the cephalad fibers.   Moderate intra-articular long head biceps tendinosis with fraying and possible partial tearing.   Mild glenohumeral and moderate AC joint osteoarthritis.   Degenerative superior labral tearing anteriorly and posteriorly.   PATIENT SURVEYS:  Quick Dash 50%  and FOTO 46%   COGNITION: Overall cognitive status: Within functional limits for tasks assessed                                  SENSATION: WFL   POSTURE: Slight rounded shoulders and shoulder elevation    UPPER EXTREMITY ROM:    Active ROM Right eval 9/16: recert RUE Left eval 9/16 RECERT LUE  Shoulder flexion 64 122 91 92  Shoulder extension        Shoulder abduction 95 88 52 82  Shoulder adduction        Shoulder internal rotation Unable to test at 90 61 degrees 5 40  Shoulder external rotation 18at side of body -5 degrees at 90 degrees 34 35  Elbow flexion [facility]  [facility]  Elbow extension [facility]  [facility]   Wrist flexion        Wrist extension        Wrist ulnar deviation        Wrist radial deviation        Wrist pronation        Wrist supination        (Blank rows = not tested)   UPPER EXTREMITY MMT:   MMT Right eval Left eval  Shoulder flexion      Shoulder extension      Shoulder abduction      Shoulder adduction      Shoulder internal rotation      Shoulder external rotation      Middle trapezius      Lower trapezius      Elbow flexion      Elbow extension      Wrist flexion      Wrist extension      Wrist ulnar deviation      Wrist radial deviation      Wrist pronation      Wrist supination      Grip strength (lbs) 30 35  (Blank rows = not tested) Unable to test strength due to pain with motion   TRUNK ROM: Trunk Flexion 65  Trunk Extension 7  Trunk R SB Limited 25%  Trunk L SB WFL  Trunk R rotation WFL  Trunk L rotation Limited 25%    Ankle ROM Seated: Motion: AROM seated Right Left  Plantarflexion 4+ 3  Dorsiflexion 4+ 4  Inversion 4 3+  Eversion 4+ 3+    SHOULDER SPECIAL TESTS: Impingement tests:  unable to test due to pain in starting position SLAP lesions:  unable to test due to pain in starting position Instability tests:  unable to test due to pain in starting position Rotator cuff assessment:  unable to test due to pain in starting position Biceps assessment:  unable to test due to pain in starting position   LOW BACK SPECIAL TEST: SLR: negative  Scour test: negative FABER: negative Pelvic compression/distraction: negative  Slump test positive LLE  6 min walk test: 1160 ft increased left foot drag and antalgic gait pattern with foot eversion    JOINT MOBILITY TESTING:  AP: painful PA: painful Inferior: painful Distraction: reduces pain   Back mobilization hypomobile and painful to grade II mobilizations    PALPATION:  Tenderness to all joint regions of GH     TODAY'S TREATMENT: DATE:  11/29/23  Assessment of goals performed in today's session for recertification. Patient expresses thing most important to her include improving strength in shoulder, arms and LE's.  FOTO: 57 Shoulder flexion: L: ~ 90 degrees R: ~ 90 degrees Shoulder abduction L: 146 degrees : R: 141 degrees : 1010 ft, RPE- 7/10 Quickdash R: 43.2% L: 56.8%  Therex:  -Supine shoulder flexion with PVC pipe x7 pt reports increase in pain when bringing shoulder back down to neutral, deferred further repetitions  -Passive range of motion shoulder flexion x10 L and R -Shoulder stabilizations with pertubations BUE x30s perturbations  -Supine external rotation with red TB 2x10    PATIENT EDUCATION: Education details: goals, POC  Person educated: Patient Education method: Explanation, Demonstration, Tactile cues, Verbal cues, and Handouts Education comprehension: verbalized understanding, returned demonstration, verbal cues required, and tactile cues required  HOME EXERCISE PROGRAM:  Access Code: [email or web address] URL: [email or web address] Date: 10/03/2023 Prepared by: Precious Bard  Exercises - Seated Scapular Retraction  -  1 x daily - 7 x weekly - 2 sets - 10 reps - 5 hold - Neck Sidebending  - 1 x daily - 7 x weekly - 2 sets - 2 reps - 30 hold - Supine Isometric Shoulder Extension with Towel  - 1 x daily - 7 x weekly - 2 sets - 10 reps - 5 hold - Supine Isometric Shoulder Adduction with Towel Roll  - 1 x daily - 7 x weekly - 2 sets - 10 reps - 5 hold - Seated Bilateral Shoulder Flexion Towel Slide at Table Top  - 1 x daily - 7 x weekly - 1 sets - 3 reps - 30 hold - Supine Posterior Pelvic Tilt  - 1 x daily - 7 x weekly - 2 sets - 5 reps - 30 hold - Supine LE Neural Mobilization  - 1 x daily - 7 x weekly - 2 sets - 10 reps - 5 hold - Seated Ankle Alphabet  - 1 x daily - 7 x weekly - 2 sets - 1 reps - 5 hold  ASSESSMENT:  CLINICAL IMPRESSION:  Patient presented to session still feeling  slightly under the weather but motivated. Reassessment of goals performed today due to lapse in treatment for personal and medical reasons. She met her FOTO goal indicating improvements in overall quality of life. Her six minute walk time decreased, but pt reports bronchitis and ankle pain limiting her performance. She presents with improved mobility with shoulder abduction partially meeting her goal, but still limited with range of motion for shoulder flexion. According to quickdash score, pt's right shoulder function has improved whereas the left shoulder is still limiting her participation in daily activities. Pt will continue to benefit from skilled therapy to address remaining deficits in order to improve overall QoL and return to PLOF.      OBJECTIVE IMPAIRMENTS: cardiopulmonary status limiting activity, decreased activity tolerance, decreased mobility, increased fascial restrictions, impaired perceived functional ability, increased muscle spasms, impaired flexibility, impaired UE functional use, improper body mechanics, postural dysfunction, and pain.   ACTIVITY LIMITATIONS: carrying, lifting, bed mobility, bathing, dressing, self feeding, reach over head, and caring for others  PARTICIPATION LIMITATIONS: meal prep, cleaning, laundry, personal finances, interpersonal relationship, driving, shopping, community activity, yard work, church, and caregiving for husband  PERSONAL FACTORS: Age, Fitness, Past/current experiences, Time since onset of injury/illness/exacerbation, and 3+ comorbidities: aorta type A dissection in sept 2011 with aortic valve replacement bypass of R coronary artery, AAA, anemia, anxiety, CAD, CHF, depression, GERD, Heart failure, HLD, HTN, IBS, Kidney problems, migraine, osteoarthritis, osteoporosis, RA, ocular migraines, back pain  are also affecting patient's functional outcome.   REHAB POTENTIAL: Fair    CLINICAL DECISION MAKING: Evolving/moderate complexity  EVALUATION  COMPLEXITY: Moderate   GOALS: Goals reviewed with patient? Yes  SHORT TERM GOALS: Target date: 12/27/2023  Patient will be independent in home exercise program to improve strength/mobility for better functional independence with ADLs. Baseline:7/23: HEP given 9/16: compliant 1x/week  Goal status: On going     LONG TERM GOALS: Target date: 01/24/2024    Patient will increase FOTO score to equal to or greater than  57%   to demonstrate statistically significant improvement in mobility and quality of life.  Baseline: 7/22: 46 9/16: 50% 10/7: 40% 12/10: 57% Goal status: MET  2.  Patient will improve shoulder AROM to > 140 degrees of flexion, scaption, and abduction for improved ability to perform overhead activities. Baseline: 7/22: see above 9/16: see above 10/7 : defer  to exacerbation 12/10: shoulder abduction >140, flexion < 140 , assess scaption next visit  Goal status: Partially Met   3.  Patient will demonstrate adequate shoulder ROM and strength to be able to shave and dress independently with pain less than 3/10. Baseline: 7/23: 6/10 pain with dressing, limited ROM, unable to test strength 9/16: 3/10 pain with dressing  Goal status: MET  4.  Patient will decrease Quick DASH score by > 8 points demonstrating reduced self-reported upper extremity disability for L and R shoulder  Baseline: 7/23: L shoulder 50% 9/16: R=54.54 % L 66% L 81% R 24% 12/10: R: 43.2% L: 56.8%  Goal status: IN PROGRESS  5.  Patient will be able to eat an entire meal without resting arm demonstrating improved functional strength Baseline: 7/23: has to rest while eating due to pain. 9/16: able to make halfway through meal 10/7: can make it about halfway through meal, some meals can get 3/4s through meal 12/10: improved, still has to rest every once in a while  Goal status: Partially Met  6.  Patient will be able to stand 15 minutes without her LLE going numb allowing for ADLS such as showering and iADLs  such as cooking Baseline: 5 minutes 12/10: will assess next visit  Goal status: IN PROGRESS  7. Patient will increase six minute walk test distance to >1500 for progression to community ambulator and improve gait ability Baseline: 1160 ft  12/10: 1010 ft  Goal status: IN PROGRESS  PLAN:  PT FREQUENCY: 2x/week  PT DURATION: 8 weeks  PLANNED INTERVENTIONS: Therapeutic exercises, Therapeutic activity, Neuromuscular re-education, Balance training, Gait training, Patient/Family education, Self Care, Joint mobilization, Vestibular training, Canalith repositioning, Visual/preceptual remediation/compensation, Dry Needling, Electrical stimulation, Spinal mobilization, Cryotherapy, Moist heat, Compression bandaging, scar mobilization, Splintting, Taping, Traction, Ultrasound, Biofeedback, Ionotophoresis 4mg /ml Dexamethasone, Manual therapy, and Re-evaluation  PLAN FOR NEXT SESSION:   Continue with LUE strengthening  L ankle strengthening, distraction, core activation, reassess scaption and flexion ROM, make HEP    Randon Goldsmith, Student-PT  This entire session was performed under direct supervision and direction of a licensed Estate agent . I have personally read, edited and approve of the note as written.  Precious Bard PT  Physical Therapist - [facility]  11/29/23, 5:39 PM

## 2023-11-29 NOTE — Progress Notes (Signed)
[facility] Counselor/Therapist Progress Note  Patient ID: Rachel Torres, MRN: [id number],    Date: 11/29/2023  Time Spent: 10:00am-10:50am  50 minutes   Treatment Type: Individual Therapy  Reported Symptoms: stress  Mental Status Exam: Appearance:  Casual     Behavior: Appropriate  Motor: Normal  Speech/Language:  Normal Rate  Affect: Appropriate  Mood: normal  Thought process: normal  Thought content:   WNL  Sensory/Perceptual disturbances:   WNL  Orientation: oriented to person, place, time/date, and situation  Attention: Good  Concentration: Good  Memory: WNL  Fund of knowledge:  Good  Insight:   Good  Judgment:  Good  Impulse Control: Good   Risk Assessment: Danger to Self:  No Self-injurious Behavior: No Danger to Others: No Duty to Warn:no Physical Aggression / Violence:No  Access to Firearms a concern: No  Gang Involvement:No   Subjective: Pt present for face-to-face individual therapy via video.  Pt consents to telehealth video session and is aware of limitations and benefits of virtual sessions.  Location of pt: home Location of therapist: home office.  Pt talked about her kitchen renovation.   They have done the demo work.  Pt is experiencing the stress of living with a renovation project. Worked on Optician, dispensing.  Pt had her mother's birthday party last weekend and it went well.    Pt talked about [name].  [name].  He will go Dec. 15th -23rd and then for another 3 weeks in January.  This will be helpful during the kitchen renovation.   Pt states she is feeling overwhelmed bc everyone depends on her.  Addressed how pt can set healthy boundaries.   Worked on self care strategies.  Provided supportive therapy.  Interventions: Cognitive Behavioral Therapy and Insight-Oriented  Diagnosis:  F33.1  Plan of Care: Recommend ongoing therapy.   Pt participated in setting treatment  goals.   Pt needs a place to talk about stressors.  Plan to meet every two weeks.    Treatment Plan (target date: 03/23/2024) Client Abilities/Strengths  Pt is bright, engaging, and motivated for therapy.   Client Treatment Preferences  Individual therapy.  Client Statement of Needs  Improve coping skills.  Have a place to talk about stressors.   Symptoms  Depressed or irritable mood. Unresolved grief issues.  Problems Addressed  Unipolar Depression Goals 1. Alleviate depressive symptoms and return to previous level of effective functioning. 2. Appropriately grieve the loss in order to normalize mood and to return to previously adaptive level of functioning. Objective Learn and implement behavioral strategies to overcome depression. Target Date: 2024-03-23 Frequency: Biweekly  Progress: 40 Modality: individual  Related Interventions Engage the client in "behavioral activation," increasing his/her activity level and contact with sources of reward, while identifying processes that inhibit activation.  Use behavioral techniques such as instruction, rehearsal, role-playing, role reversal, as needed, to facilitate activity in the client's daily life; reinforce success. Assist the client in developing skills that increase the likelihood of deriving pleasure from behavioral activation (e.g., assertiveness skills, developing an exercise plan, less internal/more external focus, increased social involvement); reinforce success. Objective Identify important people in life, past and present, and describe the quality, good and poor, of those relationships. Target Date: 2024-03-23 Frequency: Biweekly  Progress: 40 Modality: individual  Related Interventions Conduct Interpersonal Therapy beginning with the assessment of the client's "interpersonal inventory" of important past and present relationships; develop a case formulation  linking depression to grief, interpersonal role disputes, role transitions,  and/or interpersonal deficits). Objective Learn and implement problem-solving and decision-making skills. Target Date: 2024-03-23 Frequency: Biweekly  Progress: 40 Modality: individual  Related Interventions Conduct Problem-Solving Therapy using techniques such as psychoeducation, modeling, and role-playing to teach client problem-solving skills (i.e., defining a problem specifically, generating possible solutions, evaluating the pros and cons of each solution, selecting and implementing a plan of action, evaluating the efficacy of the plan, accepting or revising the plan); role-play application of the problem-solving skill to a real life issue. Encourage in the client the development of a positive problem orientation in which problems and solving them are viewed as a natural part of life and not something to be feared, despaired, or avoided. 3. Develop healthy interpersonal relationships that lead to the alleviation and help prevent the relapse of depression. 4. Develop healthy thinking patterns and beliefs about self, others, and the world that lead to the alleviation and help prevent the relapse of depression. 5. Recognize, accept, and cope with feelings of depression. Diagnosis F33.1  Conditions For Discharge Achievement of treatment goals and objectives   Salomon Fick, LCSW

## 2023-11-29 NOTE — Telephone Encounter (Signed)
Error

## 2023-11-30 DIAGNOSIS — M5416 Radiculopathy, lumbar region: Secondary | ICD-10-CM | POA: Diagnosis not present

## 2023-11-30 DIAGNOSIS — Z6835 Body mass index (BMI) 35.0-35.9, adult: Secondary | ICD-10-CM | POA: Diagnosis not present

## 2023-11-30 NOTE — Therapy (Signed)
OUTPATIENT PHYSICAL THERAPY TREATMENT/RECERT   Patient Name: Rachel Torres MRN: [id number] DOB:04-May-1956, 67 y.o., female Today's Date: 12/01/2023  END OF SESSION:  PT End of Session - 12/01/23 1058     Visit Number 14    Number of Visits 29    Date for PT Re-Evaluation 01/24/24    Authorization Type [employer]  Medicare HMO    Authorization Time Period 09/05/23-10/31/23    Progress Note Due on Visit 10    PT Start Time 1100    PT Stop Time 1144    PT Time Calculation (min) 44 min    Equipment Utilized During Treatment Gait belt    Activity Tolerance Patient tolerated treatment well    Behavior During Therapy WFL for tasks assessed/performed                     Past Medical History:  Diagnosis Date   AAA (abdominal aortic aneurysm) (HCC)    a. Chronic w/o evidence of aneurysmal dil on CTA 01/2016.   Alcohol abuse    Allergy    Anemia    Anxiety    Asthma    CAD (coronary artery disease)    a. 08/2010 s/p CABG x 1 (VG->RCA) @ time of Ao dissection repair; b. 01/2016 Lexiscan MV: mid antsept/apical defect w/ ? peri-infarct ischemia-->likely attenuation-->Med Rx.   Chewing difficulty    Chronic combined systolic (congestive) and diastolic (congestive) heart failure (HCC)    a. 2012 EF 30-35%; b. 04/2015 EF 40-45%; c. 01/2016 Echo: Ef 55-65%, nl AoV bioprosthesis, nl RV, nl PASP.   Constipation    Depression    Edema of both lower extremities    Gallbladder sludge    GERD (gastroesophageal reflux disease)    Heart failure (HCC)    Heart valve problem    History of Bicuspid Aortic Valve    a. 08/2010 s/p AVR @ time of Ao dissection repair; b. 01/2016 Echo: Ef 55-65%, nl AoV bioprosthesis, nl RV, nl PASP.   Hx of repair of dissecting thoracic aortic aneurysm, Stanford type A    a. 08/2010 s/p repair with AVR and VG->RCA; b. 01/2016 CTA: stable appearance of Asc Thoracic Aortic graft. Opacification of flase lumen of chronic abd Ao dissection w/ retrograde flow through  lumbar arteries. No aneurysmal dil of flase lumen.   Hyperlipidemia    Hypertension    Hypertensive heart disease    IBS (irritable bowel syndrome)    Internal hemorrhoids    Kidney problem    Marfan syndrome    pt denies   Migraine    Obstructive sleep apnea    Osteoarthritis    Osteoporosis    Palpitations    Pneumonia    RA (rheumatoid arthritis) (HCC)    a. Followed by Dr. Mallie Mussel   Past Surgical History:  Procedure Laterality Date   ABDOMINAL AORTIC ANEURYSM REPAIR     CARDIAC CATHETERIZATION  2001   CESAREAN SECTION  1986 & 1991   CHOLECYSTECTOMY     COLONOSCOPY     ESOPHAGOGASTRODUODENOSCOPY (EGD) WITH PROPOFOL N/A 05/08/2020   Procedure: ESOPHAGOGASTRODUODENOSCOPY (EGD) WITH PROPOFOL;  Surgeon: Pasty Spillers, MD;  Location: [facility] ENDOSCOPY;  Service: Endoscopy;  Laterality: N/A;   FRACTURE SURGERY Right    shoulder replacement   HEMORRHOID BANDING     ORIF DISTAL RADIUS FRACTURE Left    UPPER GASTROINTESTINAL ENDOSCOPY     Patient Active Problem List   Diagnosis Date Noted   Acute bronchitis 11/22/2023  Abnormal metabolism 11/14/2023   Weight regain after inital weight loss 11/14/2023   Multiple closed tarsal fractures of left foot, sequela 07/12/2023   Arm contusion, right, sequela 07/12/2023   Lumbar disc disease 07/12/2023   Atrial fibrillation (HCC) 06/09/2023   Paresthesia 06/03/2023   BMI 34.0-34.9,adult 02/17/2023   Obesity, Beginning BMI 35.45 02/17/2023   Tinnitus 01/14/2023   Change in vision 01/14/2023   Colon cancer screening 01/14/2023   Personal history of fall 01/14/2023   Problem related to unspecified psychosocial circumstances 12/28/2022   Person encountering health services to consult on behalf of another person 12/28/2022   Palpitation 12/10/2022   Fatigue 10/26/2022   B12 deficiency 10/26/2022   Right shoulder pain 09/15/2022   Depression 09/14/2022   Class 2 severe obesity with serious comorbidity and body mass index (BMI) of  35.0 to 35.9 in adult [facility]) 09/14/2022   Other constipation 05/12/2022   Medicare annual wellness visit, initial 07/17/2021   TMJ (dislocation of temporomandibular joint) 07/17/2021   Encounter for screening mammogram for breast cancer 06/05/2021   Diarrhea 04/11/2020   Epigastric pain 02/19/2020   Dark stools 02/19/2020   History of atrial fibrillation 02/05/2019   Shortness of breath 01/27/2019   Irregular surface of cornea 12/25/2018   HPV (human papilloma virus) infection 12/01/2016   Screening mammogram, encounter for 11/29/2016   Estrogen deficiency 11/29/2016   Encounter for routine gynecological examination 11/29/2016   Grade III hemorrhoids 10/06/2016   Tachycardia 08/14/2016   Hemorrhoids 08/13/2016   Atypical migraine 08/03/2016   CAD (coronary artery disease)    AAA (abdominal aortic aneurysm) (HCC)    Chronic combined systolic (congestive) and diastolic (congestive) heart failure (HCC)    Prediabetes 02/11/2016   Generalized anxiety disorder 12/08/2015   Panic attacks 07/28/2015   Chest pain 07/09/2013   Sensorineural hearing loss 03/27/2013   Vitamin D deficiency 07/03/2012   H/O aortic dissection 05/12/2012   Routine general medical examination at a health care facility 09/20/2011   Atypical chest pain 09/15/2011   S/P AVR (aortic valve replacement) 01/07/2011   CORONARY ARTERY BYPASS GRAFT, HX OF 01/07/2011   Arthritis 12/20/2010   Back pain 12/15/2010   H/O dissecting abdominal aortic aneurysm repair 08/24/2010   IRRITABLE BOWEL SYNDROME 09/09/2009   Obstructive sleep apnea 08/04/2009   External hemorrhoid 06/26/2009   Osteoporosis 01/09/2009   Hyperlipidemia 07/26/2007   Depression with anxiety 07/26/2007   Essential hypertension 07/26/2007   Allergic rhinitis 07/26/2007   Asthma 07/26/2007   GERD 07/26/2007   Osteoarthritis 07/26/2007   Ocular migraine 07/26/2007   Mild intermittent asthma without complication 07/26/2007    PCP: [name] MD    REFERRING PROVIDER: Stormy Card  REFERRING DIAG: L rotator cuff   THERAPY DIAG:  Left shoulder pain, unspecified chronicity  Stiffness of left shoulder, not elsewhere classified  Abnormal posture  Chronic right shoulder pain  Other low back pain  Other abnormalities of gait and mobility  Muscle weakness (generalized)  Rationale for Evaluation and Treatment: Rehabilitation  ONSET DATE: Feb 2024  SUBJECTIVE:  SUBJECTIVE STATEMENT: Pt reports she is doing well and states there have not been any changes since last visit including no falls. Her shoulder was irritated yesterday form prolonged driving , but is not in ay pain today. She states there is numbness down her left leg which is normal for her. She reports she visited her neurosurgeon where he plans to do a test on her nerves.     Hand dominance: Left  PERTINENT HISTORY: Bowen Bonton is a 66yoF familiar to our services. Pt referred to PT for strengthening of chronic left shoulder DJD, known remote RC injury. Recently fell and broke four metatarsals, since cleared from use of CAM rocker. She was treated successfully in past for right shoulder pain and has past medical history of Right shoulder surgery replacement. 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 PMH aorta type A dissection in sept 2011 s/p aortic valve replacement, bypass of R coronary artery, anemia, anxiety, CAD, CHF, depression, GERD, Heart failure, HLD, HTN, IBS, migraine, osteoarthritis, osteoporosis, RA, back pain.    Back pain:  Patient has new referral for radiculopathy, lumbar region. MRI showed degeneration of L1-2 and L2-3 with some disc space collapse and minor retrolisthesis of L2-3. Issue began in February, felt numbness in L thigh. No cause known, but she was at a  museum and noticed it was numb. Sometimes L foot numbness and toe numbness of 2nd and 3rd toe causing tripping. This caused her to trip July 4th and break five metatarsals. Some minor pain in the low pain noted. Pain noted to be 2-3/10 Pain in hip 3-4/10 of L hip.    PAIN:  Are you having pain? Yes, 3/10 pain in lateral deltoids in the shoulder.  PRECAUTIONS: None  FALLS:  Has patient fallen in last 6 months? Yes. Number of falls 2  PATIENT GOALS:to lift more than 3lb with LUE, be able to eat a meal without arm being tired.    OBJECTIVE:  DIAGNOSTIC FINDINGS:  IMPRESSION: Mild degenerative change in the cervical spine without acute fracture or traumatic subluxation.   IMPRESSION: Full width, full width tears of the supraspinatus and infraspinatus tendons with retraction to the glenoid. Grade 2/3 infraspinatus atrophy.   Moderate tendinosis of the distal subscapularis tendon with articular sided fraying of the cephalad fibers.   Moderate intra-articular long head biceps tendinosis with fraying and possible partial tearing.   Mild glenohumeral and moderate AC joint osteoarthritis.   Degenerative superior labral tearing anteriorly and posteriorly.   PATIENT SURVEYS:  Quick Dash 50%  and FOTO 46%   COGNITION: Overall cognitive status: Within functional limits for tasks assessed                                  SENSATION: WFL   POSTURE: Slight rounded shoulders and shoulder elevation    UPPER EXTREMITY ROM:    Active ROM Right eval 9/16: recert RUE Left eval 9/16 RECERT LUE  Shoulder flexion 64 122 91 92  Shoulder extension        Shoulder abduction 95 88 52 82  Shoulder adduction        Shoulder internal rotation Unable to test at 90 61 degrees 5 40  Shoulder external rotation 18at side of body -5 degrees at 90 degrees 34 35  Elbow flexion [facility]  WFL   Elbow extension [facility]  WFL   Wrist flexion        Wrist extension  Wrist ulnar deviation        Wrist  radial deviation        Wrist pronation        Wrist supination        (Blank rows = not tested)   UPPER EXTREMITY MMT:   MMT Right eval Left eval  Shoulder flexion      Shoulder extension      Shoulder abduction      Shoulder adduction      Shoulder internal rotation      Shoulder external rotation      Middle trapezius      Lower trapezius      Elbow flexion      Elbow extension      Wrist flexion      Wrist extension      Wrist ulnar deviation      Wrist radial deviation      Wrist pronation      Wrist supination      Grip strength (lbs) 30 35  (Blank rows = not tested) Unable to test strength due to pain with motion   TRUNK ROM: Trunk Flexion 65  Trunk Extension 7  Trunk R SB Limited 25%  Trunk L SB WFL  Trunk R rotation WFL  Trunk L rotation Limited 25%    Ankle ROM Seated: Motion: AROM seated Right Left  Plantarflexion 4+ 3  Dorsiflexion 4+ 4  Inversion 4 3+  Eversion 4+ 3+    SHOULDER SPECIAL TESTS: Impingement tests:  unable to test due to pain in starting position SLAP lesions:  unable to test due to pain in starting position Instability tests:  unable to test due to pain in starting position Rotator cuff assessment:  unable to test due to pain in starting position Biceps assessment:  unable to test due to pain in starting position   LOW BACK SPECIAL TEST: SLR: negative  Scour test: negative FABER: negative Pelvic compression/distraction: negative  Slump test positive LLE  6 min walk test: 1160 ft increased left foot drag and antalgic gait pattern with foot eversion    JOINT MOBILITY TESTING:  AP: painful PA: painful Inferior: painful Distraction: reduces pain   Back mobilization hypomobile and painful to grade II mobilizations    PALPATION:  Tenderness to all joint regions of GH     TODAY'S TREATMENT: DATE: 12/01/23 Shoulder flexion R: 139 degrees L: 158 degrees Therex:  Supine hamstring stretch x60s bil Supine piriformis  stretch x30s bil  Supine knee to chest stretch x60s bil  -Seated cable row RTB 2x10  -Standing shoulder abduction wall climb with five second hold x5 BUE -Standing shoulder flexion wall climb with five second hold x5 BUE -Seated external rotation with RTB 2x10  -Standing hip abduction 2x10 each LE -Standing hip extension 2x10 each LE -Wall alphabet for scapular stabilization x1 BUE, pt reports pain with prolonged shoulder flexion on LUE, pt only able to complete half of alphabet on LUE, deferred further reps  -Standing swiss ball abdominal bracing with marches alternating LE 3x20   PATIENT EDUCATION: Education details: goals, POC  Person educated: Patient Education method: Explanation, Demonstration, Tactile cues, Verbal cues, and Handouts Education comprehension: verbalized understanding, returned demonstration, verbal cues required, and tactile cues required  HOME EXERCISE PROGRAM:  Access Code: [email or web address] URL: [email or web address] Date: 10/03/2023 Prepared by: Precious Bard  Exercises - Seated Scapular Retraction  - 1 x daily - 7 x weekly - 2 sets - 10 reps - 5 hold -  Neck Sidebending  - 1 x daily - 7 x weekly - 2 sets - 2 reps - 30 hold - Supine Isometric Shoulder Extension with Towel  - 1 x daily - 7 x weekly - 2 sets - 10 reps - 5 hold - Supine Isometric Shoulder Adduction with Towel Roll  - 1 x daily - 7 x weekly - 2 sets - 10 reps - 5 hold - Seated Bilateral Shoulder Flexion Towel Slide at Table Top  - 1 x daily - 7 x weekly - 1 sets - 3 reps - 30 hold - Supine Posterior Pelvic Tilt  - 1 x daily - 7 x weekly - 2 sets - 5 reps - 30 hold - Supine LE Neural Mobilization  - 1 x daily - 7 x weekly - 2 sets - 10 reps - 5 hold - Seated Ankle Alphabet  - 1 x daily - 7 x weekly - 2 sets - 1 reps - 5 hold  ASSESSMENT:  CLINICAL IMPRESSION:  Patient presented to session in good spirit and making progress towards her goals. She presented with increased shoulder flexion  range of motion and decreased levels of pain compared to previous session. She is challenged with activities involving sustained shoulder flexion once at 90 degrees indicating strength impairments. With strengthening activities involving external rotation, the LUE is noticeably weaker than RUE. Pt will continue to benefit from skilled therapy to address remaining deficits in order to improve overall QoL and return to PLOF.      OBJECTIVE IMPAIRMENTS: cardiopulmonary status limiting activity, decreased activity tolerance, decreased mobility, increased fascial restrictions, impaired perceived functional ability, increased muscle spasms, impaired flexibility, impaired UE functional use, improper body mechanics, postural dysfunction, and pain.   ACTIVITY LIMITATIONS: carrying, lifting, bed mobility, bathing, dressing, self feeding, reach over head, and caring for others  PARTICIPATION LIMITATIONS: meal prep, cleaning, laundry, personal finances, interpersonal relationship, driving, shopping, community activity, yard work, church, and caregiving for husband  PERSONAL FACTORS: Age, Fitness, Past/current experiences, Time since onset of injury/illness/exacerbation, and 3+ comorbidities: aorta type A dissection in sept 2011 with aortic valve replacement bypass of R coronary artery, AAA, anemia, anxiety, CAD, CHF, depression, GERD, Heart failure, HLD, HTN, IBS, Kidney problems, migraine, osteoarthritis, osteoporosis, RA, ocular migraines, back pain  are also affecting patient's functional outcome.   REHAB POTENTIAL: Fair    CLINICAL DECISION MAKING: Evolving/moderate complexity  EVALUATION COMPLEXITY: Moderate   GOALS: Goals reviewed with patient? Yes  SHORT TERM GOALS: Target date: 12/27/2023  Patient will be independent in home exercise program to improve strength/mobility for better functional independence with ADLs. Baseline:7/23: HEP given 9/16: compliant 1x/week  Goal status: On going      LONG TERM GOALS: Target date: 01/24/2024    Patient will increase FOTO score to equal to or greater than  57%   to demonstrate statistically significant improvement in mobility and quality of life.  Baseline: 7/22: 46 9/16: 50% 10/7: 40% 12/10: 57% Goal status: MET  2.  Patient will improve shoulder AROM to > 140 degrees of flexion, scaption, and abduction for improved ability to perform overhead activities. Baseline: 7/22: see above 9/16: see above 10/7 : defer to exacerbation 12/10: shoulder abduction >140, flexion < 140 , assess scaption next visit  Goal status: Partially Met   3.  Patient will demonstrate adequate shoulder ROM and strength to be able to shave and dress independently with pain less than 3/10. Baseline: 7/23: 6/10 pain with dressing, limited ROM, unable to test strength  9/16: 3/10 pain with dressing  Goal status: MET  4.  Patient will decrease Quick DASH score by > 8 points demonstrating reduced self-reported upper extremity disability for L and R shoulder  Baseline: 7/23: L shoulder 50% 9/16: R=54.54 % L 66% L 81% R 24% 12/10: R: 43.2% L: 56.8%  Goal status: IN PROGRESS  5.  Patient will be able to eat an entire meal without resting arm demonstrating improved functional strength Baseline: 7/23: has to rest while eating due to pain. 9/16: able to make halfway through meal 10/7: can make it about halfway through meal, some meals can get 3/4s through meal 12/10: improved, still has to rest every once in a while  Goal status: Partially Met  6.  Patient will be able to stand 15 minutes without her LLE going numb allowing for ADLS such as showering and iADLs such as cooking Baseline: 5 minutes 12/10: will assess next visit  Goal status: IN PROGRESS  7. Patient will increase six minute walk test distance to >1500 for progression to community ambulator and improve gait ability Baseline: 1160 ft  12/10: 1010 ft  Goal status: IN PROGRESS  PLAN:  PT FREQUENCY:  2x/week  PT DURATION: 8 weeks  PLANNED INTERVENTIONS: Therapeutic exercises, Therapeutic activity, Neuromuscular re-education, Balance training, Gait training, Patient/Family education, Self Care, Joint mobilization, Vestibular training, Canalith repositioning, Visual/preceptual remediation/compensation, Dry Needling, Electrical stimulation, Spinal mobilization, Cryotherapy, Moist heat, Compression bandaging, scar mobilization, Splintting, Taping, Traction, Ultrasound, Biofeedback, Ionotophoresis 4mg /ml Dexamethasone, Manual therapy, and Re-evaluation  PLAN FOR NEXT SESSION:   Continue with LUE strengthening  L ankle strengthening, distraction, core activation, reassess scaption and flexion ROM, make HEP    Randon Goldsmith, Student-PT  This entire session was performed under direct supervision and direction of a licensed Estate agent . I have personally read, edited and approve of the note as written.  Precious Bard PT  Physical Therapist - [facility]  12/01/23, 5:36 PM

## 2023-12-01 ENCOUNTER — Other Ambulatory Visit: Payer: Self-pay | Admitting: Family Medicine

## 2023-12-01 ENCOUNTER — Ambulatory Visit: Payer: Medicare HMO

## 2023-12-01 DIAGNOSIS — M5459 Other low back pain: Secondary | ICD-10-CM | POA: Diagnosis not present

## 2023-12-01 DIAGNOSIS — M25612 Stiffness of left shoulder, not elsewhere classified: Secondary | ICD-10-CM | POA: Diagnosis not present

## 2023-12-01 DIAGNOSIS — R293 Abnormal posture: Secondary | ICD-10-CM | POA: Diagnosis not present

## 2023-12-01 DIAGNOSIS — G8929 Other chronic pain: Secondary | ICD-10-CM

## 2023-12-01 DIAGNOSIS — M25511 Pain in right shoulder: Secondary | ICD-10-CM | POA: Diagnosis not present

## 2023-12-01 DIAGNOSIS — R2689 Other abnormalities of gait and mobility: Secondary | ICD-10-CM

## 2023-12-01 DIAGNOSIS — R278 Other lack of coordination: Secondary | ICD-10-CM | POA: Diagnosis not present

## 2023-12-01 DIAGNOSIS — M25512 Pain in left shoulder: Secondary | ICD-10-CM | POA: Diagnosis not present

## 2023-12-01 DIAGNOSIS — M6281 Muscle weakness (generalized): Secondary | ICD-10-CM | POA: Diagnosis not present

## 2023-12-01 NOTE — Telephone Encounter (Signed)
Please change that, it is fine  Thanks

## 2023-12-01 NOTE — Telephone Encounter (Signed)
Pharmacy asking to change Rx from 1 each to #21 tabs

## 2023-12-06 ENCOUNTER — Ambulatory Visit: Payer: Medicare HMO

## 2023-12-06 ENCOUNTER — Ambulatory Visit: Payer: Medicare HMO | Admitting: Psychology

## 2023-12-06 DIAGNOSIS — R2689 Other abnormalities of gait and mobility: Secondary | ICD-10-CM | POA: Diagnosis not present

## 2023-12-06 DIAGNOSIS — M5459 Other low back pain: Secondary | ICD-10-CM

## 2023-12-06 DIAGNOSIS — R293 Abnormal posture: Secondary | ICD-10-CM

## 2023-12-06 DIAGNOSIS — M6281 Muscle weakness (generalized): Secondary | ICD-10-CM

## 2023-12-06 DIAGNOSIS — M25511 Pain in right shoulder: Secondary | ICD-10-CM | POA: Diagnosis not present

## 2023-12-06 DIAGNOSIS — R278 Other lack of coordination: Secondary | ICD-10-CM

## 2023-12-06 DIAGNOSIS — M25512 Pain in left shoulder: Secondary | ICD-10-CM

## 2023-12-06 DIAGNOSIS — F331 Major depressive disorder, recurrent, moderate: Secondary | ICD-10-CM | POA: Diagnosis not present

## 2023-12-06 DIAGNOSIS — M25612 Stiffness of left shoulder, not elsewhere classified: Secondary | ICD-10-CM | POA: Diagnosis not present

## 2023-12-06 DIAGNOSIS — G8929 Other chronic pain: Secondary | ICD-10-CM

## 2023-12-06 NOTE — Therapy (Unsigned)
OUTPATIENT PHYSICAL THERAPY TREATMENT/RECERT   Patient Name: Rachel Torres MRN: [id number] DOB:10-14-1956, 67 y.o., female Today's Date: 12/07/2023  END OF SESSION:  PT End of Session - 12/06/23 1530     Visit Number 15    Number of Visits 29    Date for PT Re-Evaluation 01/24/24    Authorization Type [employer]  Medicare HMO    Authorization Time Period 09/05/23-10/31/23    Progress Note Due on Visit 10    PT Start Time 1400    PT Stop Time 1444    PT Time Calculation (min) 44 min    Equipment Utilized During Treatment Gait belt    Activity Tolerance Patient tolerated treatment well    Behavior During Therapy WFL for tasks assessed/performed                      Past Medical History:  Diagnosis Date   AAA (abdominal aortic aneurysm) (HCC)    a. Chronic w/o evidence of aneurysmal dil on CTA 01/2016.   Alcohol abuse    Allergy    Anemia    Anxiety    Asthma    CAD (coronary artery disease)    a. 08/2010 s/p CABG x 1 (VG->RCA) @ time of Ao dissection repair; b. 01/2016 Lexiscan MV: mid antsept/apical defect w/ ? peri-infarct ischemia-->likely attenuation-->Med Rx.   Chewing difficulty    Chronic combined systolic (congestive) and diastolic (congestive) heart failure (HCC)    a. 2012 EF 30-35%; b. 04/2015 EF 40-45%; c. 01/2016 Echo: Ef 55-65%, nl AoV bioprosthesis, nl RV, nl PASP.   Constipation    Depression    Edema of both lower extremities    Gallbladder sludge    GERD (gastroesophageal reflux disease)    Heart failure (HCC)    Heart valve problem    History of Bicuspid Aortic Valve    a. 08/2010 s/p AVR @ time of Ao dissection repair; b. 01/2016 Echo: Ef 55-65%, nl AoV bioprosthesis, nl RV, nl PASP.   Hx of repair of dissecting thoracic aortic aneurysm, Stanford type A    a. 08/2010 s/p repair with AVR and VG->RCA; b. 01/2016 CTA: stable appearance of Asc Thoracic Aortic graft. Opacification of flase lumen of chronic abd Ao dissection w/ retrograde flow through  lumbar arteries. No aneurysmal dil of flase lumen.   Hyperlipidemia    Hypertension    Hypertensive heart disease    IBS (irritable bowel syndrome)    Internal hemorrhoids    Kidney problem    Marfan syndrome    pt denies   Migraine    Obstructive sleep apnea    Osteoarthritis    Osteoporosis    Palpitations    Pneumonia    RA (rheumatoid arthritis) (HCC)    a. Followed by Dr. Mallie Mussel   Past Surgical History:  Procedure Laterality Date   ABDOMINAL AORTIC ANEURYSM REPAIR     CARDIAC CATHETERIZATION  2001   CESAREAN SECTION  1986 & 1991   CHOLECYSTECTOMY     COLONOSCOPY     ESOPHAGOGASTRODUODENOSCOPY (EGD) WITH PROPOFOL N/A 05/08/2020   Procedure: ESOPHAGOGASTRODUODENOSCOPY (EGD) WITH PROPOFOL;  Surgeon: Pasty Spillers, MD;  Location: [facility] ENDOSCOPY;  Service: Endoscopy;  Laterality: N/A;   FRACTURE SURGERY Right    shoulder replacement   HEMORRHOID BANDING     ORIF DISTAL RADIUS FRACTURE Left    UPPER GASTROINTESTINAL ENDOSCOPY     Patient Active Problem List   Diagnosis Date Noted   Acute bronchitis 11/22/2023  Abnormal metabolism 11/14/2023   Weight regain after inital weight loss 11/14/2023   Multiple closed tarsal fractures of left foot, sequela 07/12/2023   Arm contusion, right, sequela 07/12/2023   Lumbar disc disease 07/12/2023   Atrial fibrillation (HCC) 06/09/2023   Paresthesia 06/03/2023   BMI 34.0-34.9,adult 02/17/2023   Obesity, Beginning BMI 35.45 02/17/2023   Tinnitus 01/14/2023   Change in vision 01/14/2023   Colon cancer screening 01/14/2023   Personal history of fall 01/14/2023   Problem related to unspecified psychosocial circumstances 12/28/2022   Person encountering health services to consult on behalf of another person 12/28/2022   Palpitation 12/10/2022   Fatigue 10/26/2022   B12 deficiency 10/26/2022   Right shoulder pain 09/15/2022   Depression 09/14/2022   Class 2 severe obesity with serious comorbidity and body mass index (BMI) of  35.0 to 35.9 in adult [facility]) 09/14/2022   Other constipation 05/12/2022   Medicare annual wellness visit, initial 07/17/2021   TMJ (dislocation of temporomandibular joint) 07/17/2021   Encounter for screening mammogram for breast cancer 06/05/2021   Diarrhea 04/11/2020   Epigastric pain 02/19/2020   Dark stools 02/19/2020   History of atrial fibrillation 02/05/2019   Shortness of breath 01/27/2019   Irregular surface of cornea 12/25/2018   HPV (human papilloma virus) infection 12/01/2016   Screening mammogram, encounter for 11/29/2016   Estrogen deficiency 11/29/2016   Encounter for routine gynecological examination 11/29/2016   Grade III hemorrhoids 10/06/2016   Tachycardia 08/14/2016   Hemorrhoids 08/13/2016   Atypical migraine 08/03/2016   CAD (coronary artery disease)    AAA (abdominal aortic aneurysm) (HCC)    Chronic combined systolic (congestive) and diastolic (congestive) heart failure (HCC)    Prediabetes 02/11/2016   Generalized anxiety disorder 12/08/2015   Panic attacks 07/28/2015   Chest pain 07/09/2013   Sensorineural hearing loss 03/27/2013   Vitamin D deficiency 07/03/2012   H/O aortic dissection 05/12/2012   Routine general medical examination at a health care facility 09/20/2011   Atypical chest pain 09/15/2011   S/P AVR (aortic valve replacement) 01/07/2011   CORONARY ARTERY BYPASS GRAFT, HX OF 01/07/2011   Arthritis 12/20/2010   Back pain 12/15/2010   H/O dissecting abdominal aortic aneurysm repair 08/24/2010   IRRITABLE BOWEL SYNDROME 09/09/2009   Obstructive sleep apnea 08/04/2009   External hemorrhoid 06/26/2009   Osteoporosis 01/09/2009   Hyperlipidemia 07/26/2007   Depression with anxiety 07/26/2007   Essential hypertension 07/26/2007   Allergic rhinitis 07/26/2007   Asthma 07/26/2007   GERD 07/26/2007   Osteoarthritis 07/26/2007   Ocular migraine 07/26/2007   Mild intermittent asthma without complication 07/26/2007    PCP: [name] MD    REFERRING PROVIDER: Stormy Card  REFERRING DIAG: L rotator cuff   THERAPY DIAG:  Left shoulder pain, unspecified chronicity  Stiffness of left shoulder, not elsewhere classified  Abnormal posture  Other abnormalities of gait and mobility  Other low back pain  Chronic right shoulder pain  Muscle weakness (generalized)  Other lack of coordination  Rationale for Evaluation and Treatment: Rehabilitation  ONSET DATE: Feb 2024  SUBJECTIVE:  SUBJECTIVE STATEMENT: Pt reports she is doing well and states there have not been any changes since last visit including no stumbles/ falls. She is currently not in nay pain, but has a headache from constant construction being done in her home. She found increased difficulty over the weekend with lifting a pitcher of tea with her left hand.   Hand dominance: Left  PERTINENT HISTORY: Darshae Munion is a 66yoF familiar to our services. Pt referred to PT for strengthening of chronic left shoulder DJD, known remote RC injury. Recently fell and broke four metatarsals, since cleared from use of CAM rocker. She was treated successfully in past for right shoulder pain and has past medical history of Right shoulder surgery replacement. 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 PMH aorta type A dissection in sept 2011 s/p aortic valve replacement, bypass of R coronary artery, anemia, anxiety, CAD, CHF, depression, GERD, Heart failure, HLD, HTN, IBS, migraine, osteoarthritis, osteoporosis, RA, back pain.    Back pain:  Patient has new referral for radiculopathy, lumbar region. MRI showed degeneration of L1-2 and L2-3 with some disc space collapse and minor retrolisthesis of L2-3. Issue began in February, felt numbness in L thigh. No cause known, but she was at a museum  and noticed it was numb. Sometimes L foot numbness and toe numbness of 2nd and 3rd toe causing tripping. This caused her to trip July 4th and break five metatarsals. Some minor pain in the low pain noted. Pain noted to be 2-3/10 Pain in hip 3-4/10 of L hip.    PAIN:  Are you having pain? Yes, 3/10 pain in lateral deltoids in the shoulder.  PRECAUTIONS: None  FALLS:  Has patient fallen in last 6 months? Yes. Number of falls 2  PATIENT GOALS:to lift more than 3lb with LUE, be able to eat a meal without arm being tired.    OBJECTIVE:  DIAGNOSTIC FINDINGS:  IMPRESSION: Mild degenerative change in the cervical spine without acute fracture or traumatic subluxation.   IMPRESSION: Full width, full width tears of the supraspinatus and infraspinatus tendons with retraction to the glenoid. Grade 2/3 infraspinatus atrophy.   Moderate tendinosis of the distal subscapularis tendon with articular sided fraying of the cephalad fibers.   Moderate intra-articular long head biceps tendinosis with fraying and possible partial tearing.   Mild glenohumeral and moderate AC joint osteoarthritis.   Degenerative superior labral tearing anteriorly and posteriorly.   PATIENT SURVEYS:  Quick Dash 50%  and FOTO 46%   COGNITION: Overall cognitive status: Within functional limits for tasks assessed                                  SENSATION: WFL   POSTURE: Slight rounded shoulders and shoulder elevation    UPPER EXTREMITY ROM:    Active ROM Right eval 9/16: recert RUE Left eval 9/16 RECERT LUE  Shoulder flexion 64 122 91 92  Shoulder extension        Shoulder abduction 95 88 52 82  Shoulder adduction        Shoulder internal rotation Unable to test at 90 61 degrees 5 40  Shoulder external rotation 18at side of body -5 degrees at 90 degrees 34 35  Elbow flexion [facility]  WFL   Elbow extension [facility]  WFL   Wrist flexion        Wrist extension        Wrist ulnar deviation  Wrist radial  deviation        Wrist pronation        Wrist supination        (Blank rows = not tested)   UPPER EXTREMITY MMT:   MMT Right eval Left eval  Shoulder flexion      Shoulder extension      Shoulder abduction      Shoulder adduction      Shoulder internal rotation      Shoulder external rotation      Middle trapezius      Lower trapezius      Elbow flexion      Elbow extension      Wrist flexion      Wrist extension      Wrist ulnar deviation      Wrist radial deviation      Wrist pronation      Wrist supination      Grip strength (lbs) 30 35  (Blank rows = not tested) Unable to test strength due to pain with motion   TRUNK ROM: Trunk Flexion 65  Trunk Extension 7  Trunk R SB Limited 25%  Trunk L SB WFL  Trunk R rotation WFL  Trunk L rotation Limited 25%    Ankle ROM Seated: Motion: AROM seated Right Left  Plantarflexion 4+ 3  Dorsiflexion 4+ 4  Inversion 4 3+  Eversion 4+ 3+    SHOULDER SPECIAL TESTS: Impingement tests:  unable to test due to pain in starting position SLAP lesions:  unable to test due to pain in starting position Instability tests:  unable to test due to pain in starting position Rotator cuff assessment:  unable to test due to pain in starting position Biceps assessment:  unable to test due to pain in starting position   LOW BACK SPECIAL TEST: SLR: negative  Scour test: negative FABER: negative Pelvic compression/distraction: negative  Slump test positive LLE  6 min walk test: 1160 ft increased left foot drag and antalgic gait pattern with foot eversion    JOINT MOBILITY TESTING:  AP: painful PA: painful Inferior: painful Distraction: reduces pain   Back mobilization hypomobile and painful to grade II mobilizations    PALPATION:  Tenderness to all joint regions of GH     TODAY'S TREATMENT: DATE: 12/07/23  Manual:  Trigger Point Dry-Needling  Treatment instructions: Expect mild to moderate muscle soreness. S/S of  pneumothorax if dry needled over a lung field, and to seek immediate medical attention should they occur. Patient verbalized understanding of these instructions and education.    Patient Consent Given: Yes Education handout provided: No Muscles treated: bilateral upper trap Electrical stimulation performed: No Parameters: N/A  Treatment response/outcome: Pt did have notable TP's in the UT.   -Lateral hip left distraction x4, improves symptoms  -Figure four left hip distraction x4,  Therex: Supine pelvic tilts x10 with two sec hold  SL raises with one knee bent with 5 sec hold 2x10 each LE Swiss ball marches into mat table 3x20 Wall slides 1x10 with 5 sec hold on LUE, 1x5 on RUE but deferred further reps due to pain  Active assisted shoulder abduction with PVC pipe 2x10 BUE Step ups 2x15, second set with 4#aw Cable row RTB 2x10  PATIENT EDUCATION: Education details: goals, POC  Person educated: Patient Education method: Explanation, Demonstration, Tactile cues, Verbal cues, and Handouts Education comprehension: verbalized understanding, returned demonstration, verbal cues required, and tactile cues required  HOME EXERCISE PROGRAM:  Access  Code: [email or web address] URL: [email or web address] Date: 10/03/2023 Prepared by: Precious Bard  Exercises - Seated Scapular Retraction  - 1 x daily - 7 x weekly - 2 sets - 10 reps - 5 hold - Neck Sidebending  - 1 x daily - 7 x weekly - 2 sets - 2 reps - 30 hold - Supine Isometric Shoulder Extension with Towel  - 1 x daily - 7 x weekly - 2 sets - 10 reps - 5 hold - Supine Isometric Shoulder Adduction with Towel Roll  - 1 x daily - 7 x weekly - 2 sets - 10 reps - 5 hold - Seated Bilateral Shoulder Flexion Towel Slide at Table Top  - 1 x daily - 7 x weekly - 1 sets - 3 reps - 30 hold - Supine Posterior Pelvic Tilt  - 1 x daily - 7 x weekly - 2 sets - 5 reps - 30 hold - Supine LE Neural Mobilization  - 1 x daily - 7 x weekly - 2 sets - 10  reps - 5 hold - Seated Ankle Alphabet  - 1 x daily - 7 x weekly - 2 sets - 1 reps - 5 hold  ASSESSMENT:  CLINICAL IMPRESSION:  Patient presents to session in good spirits and without pain. With shoulder flexion ROM activities, pt reports pain in RUE this session. There is LLE weakness with performance of step ups evidenced by catching of LE. Pt reports improved symptoms from dry needling and mobilizations for hip. Se is able to tolerate core strengthening activities including straight leg raises and posterior pelvic tilts. Pt will continue to benefit from skilled therapy to address remaining deficits in order to improve overall QoL and return to PLOF.      OBJECTIVE IMPAIRMENTS: cardiopulmonary status limiting activity, decreased activity tolerance, decreased mobility, increased fascial restrictions, impaired perceived functional ability, increased muscle spasms, impaired flexibility, impaired UE functional use, improper body mechanics, postural dysfunction, and pain.   ACTIVITY LIMITATIONS: carrying, lifting, bed mobility, bathing, dressing, self feeding, reach over head, and caring for others  PARTICIPATION LIMITATIONS: meal prep, cleaning, laundry, personal finances, interpersonal relationship, driving, shopping, community activity, yard work, church, and caregiving for husband  PERSONAL FACTORS: Age, Fitness, Past/current experiences, Time since onset of injury/illness/exacerbation, and 3+ comorbidities: aorta type A dissection in sept 2011 with aortic valve replacement bypass of R coronary artery, AAA, anemia, anxiety, CAD, CHF, depression, GERD, Heart failure, HLD, HTN, IBS, Kidney problems, migraine, osteoarthritis, osteoporosis, RA, ocular migraines, back pain  are also affecting patient's functional outcome.   REHAB POTENTIAL: Fair    CLINICAL DECISION MAKING: Evolving/moderate complexity  EVALUATION COMPLEXITY: Moderate   GOALS: Goals reviewed with patient? Yes  SHORT TERM  GOALS: Target date: 12/27/2023  Patient will be independent in home exercise program to improve strength/mobility for better functional independence with ADLs. Baseline:7/23: HEP given 9/16: compliant 1x/week  Goal status: On going     LONG TERM GOALS: Target date: 01/24/2024    Patient will increase FOTO score to equal to or greater than  57%   to demonstrate statistically significant improvement in mobility and quality of life.  Baseline: 7/22: 46 9/16: 50% 10/7: 40% 12/10: 57% Goal status: MET  2.  Patient will improve shoulder AROM to > 140 degrees of flexion, scaption, and abduction for improved ability to perform overhead activities. Baseline: 7/22: see above 9/16: see above 10/7 : defer to exacerbation 12/10: shoulder abduction >140, flexion < 140 , assess scaption next visit  Goal status: Partially  Met   3.  Patient will demonstrate adequate shoulder ROM and strength to be able to shave and dress independently with pain less than 3/10. Baseline: 7/23: 6/10 pain with dressing, limited ROM, unable to test strength 9/16: 3/10 pain with dressing  Goal status: MET  4.  Patient will decrease Quick DASH score by > 8 points demonstrating reduced self-reported upper extremity disability for L and R shoulder  Baseline: 7/23: L shoulder 50% 9/16: R=54.54 % L 66% L 81% R 24% 12/10: R: 43.2% L: 56.8%  Goal status: IN PROGRESS  5.  Patient will be able to eat an entire meal without resting arm demonstrating improved functional strength Baseline: 7/23: has to rest while eating due to pain. 9/16: able to make halfway through meal 10/7: can make it about halfway through meal, some meals can get 3/4s through meal 12/10: improved, still has to rest every once in a while  Goal status: Partially Met  6.  Patient will be able to stand 15 minutes without her LLE going numb allowing for ADLS such as showering and iADLs such as cooking Baseline: 5 minutes 12/10: will assess next visit  Goal status:  IN PROGRESS  7. Patient will increase six minute walk test distance to >1500 for progression to community ambulator and improve gait ability Baseline: 1160 ft  12/10: 1010 ft  Goal status: IN PROGRESS  PLAN:  PT FREQUENCY: 2x/week  PT DURATION: 8 weeks  PLANNED INTERVENTIONS: Therapeutic exercises, Therapeutic activity, Neuromuscular re-education, Balance training, Gait training, Patient/Family education, Self Care, Joint mobilization, Vestibular training, Canalith repositioning, Visual/preceptual remediation/compensation, Dry Needling, Electrical stimulation, Spinal mobilization, Cryotherapy, Moist heat, Compression bandaging, scar mobilization, Splintting, Taping, Traction, Ultrasound, Biofeedback, Ionotophoresis 4mg /ml Dexamethasone, Manual therapy, and Re-evaluation  PLAN FOR NEXT SESSION:   Continue with LUE strengthening  L ankle strengthening, distraction, core activation, reassess scaption and flexion ROM, make HEP    Randon Goldsmith, Student-PT  This entire session was performed under direct supervision and direction of a licensed Estate agent . I have personally read, edited and approve of the note as written.  Precious Bard PT  Physical Therapist - [facility]  12/07/23, 7:02 AM

## 2023-12-06 NOTE — Progress Notes (Signed)
[facility] Counselor/Therapist Progress Note  Patient ID: Rachel Torres, MRN: [id number],    Date: 12/06/2023  Time Spent: 10:00am-10:50am  50 minutes   Treatment Type: Individual Therapy  Reported Symptoms: stress  Mental Status Exam: Appearance:  Casual     Behavior: Appropriate  Motor: Normal  Speech/Language:  Normal Rate  Affect: Appropriate  Mood: normal  Thought process: normal  Thought content:   WNL  Sensory/Perceptual disturbances:   WNL  Orientation: oriented to person, place, time/date, and situation  Attention: Good  Concentration: Good  Memory: WNL  Fund of knowledge:  Good  Insight:   Good  Judgment:  Good  Impulse Control: Good   Risk Assessment: Danger to Self:  No Self-injurious Behavior: No Danger to Others: No Duty to Warn:no Physical Aggression / Violence:No  Access to Firearms a concern: No  Gang Involvement:No   Subjective: Pt present for face-to-face individual therapy via video.  Pt consents to telehealth video session and is aware of limitations and benefits of virtual sessions.  Location of pt: home Location of therapist: home office.  Pt talked about her home renovation.  They are in the middle of major work on the house.   They found water damage that needs repairs.   Pt talked about [name].   He has had a couple of falls bc he does not use his walker correctly.  Pt found out she will not get respite for [name] bc he needs a memory unit and the [place] does not have a memory unit.   Pt is disappointed that she will not get the respite she needs.   Pt states she is feeling overwhelmed bc everyone depends on her.  Addressed how pt can set healthy boundaries.   Worked on self care strategies.  Provided supportive therapy.  Interventions: Cognitive Behavioral Therapy and Insight-Oriented  Diagnosis:  F33.1  Plan of Care: Recommend ongoing therapy.   Pt participated in setting treatment goals.   Pt needs a place  to talk about stressors.  Plan to meet every two weeks.    Treatment Plan (target date: 03/23/2024) Client Abilities/Strengths  Pt is bright, engaging, and motivated for therapy.   Client Treatment Preferences  Individual therapy.  Client Statement of Needs  Improve coping skills.  Have a place to talk about stressors.   Symptoms  Depressed or irritable mood. Unresolved grief issues.  Problems Addressed  Unipolar Depression Goals 1. Alleviate depressive symptoms and return to previous level of effective functioning. 2. Appropriately grieve the loss in order to normalize mood and to return to previously adaptive level of functioning. Objective Learn and implement behavioral strategies to overcome depression. Target Date: 2024-03-23 Frequency: Biweekly  Progress: 40 Modality: individual  Related Interventions Engage the client in "behavioral activation," increasing his/her activity level and contact with sources of reward, while identifying processes that inhibit activation.  Use behavioral techniques such as instruction, rehearsal, role-playing, role reversal, as needed, to facilitate activity in the client's daily life; reinforce success. Assist the client in developing skills that increase the likelihood of deriving pleasure from behavioral activation (e.g., assertiveness skills, developing an exercise plan, less internal/more external focus, increased social involvement); reinforce success. Objective Identify important people in life, past and present, and describe the quality, good and poor, of those relationships. Target Date: 2024-03-23 Frequency: Biweekly  Progress: 40 Modality: individual  Related Interventions Conduct Interpersonal Therapy beginning with the assessment of the client's "interpersonal inventory" of important past and present  relationships; develop a case formulation linking depression to grief, interpersonal role disputes, role transitions, and/or interpersonal  deficits). Objective Learn and implement problem-solving and decision-making skills. Target Date: 2024-03-23 Frequency: Biweekly  Progress: 40 Modality: individual  Related Interventions Conduct Problem-Solving Therapy using techniques such as psychoeducation, modeling, and role-playing to teach client problem-solving skills (i.e., defining a problem specifically, generating possible solutions, evaluating the pros and cons of each solution, selecting and implementing a plan of action, evaluating the efficacy of the plan, accepting or revising the plan); role-play application of the problem-solving skill to a real life issue. Encourage in the client the development of a positive problem orientation in which problems and solving them are viewed as a natural part of life and not something to be feared, despaired, or avoided. 3. Develop healthy interpersonal relationships that lead to the alleviation and help prevent the relapse of depression. 4. Develop healthy thinking patterns and beliefs about self, others, and the world that lead to the alleviation and help prevent the relapse of depression. 5. Recognize, accept, and cope with feelings of depression. Diagnosis F33.1  Conditions For Discharge Achievement of treatment goals and objectives   Salomon Fick, LCSW

## 2023-12-07 ENCOUNTER — Other Ambulatory Visit: Payer: Self-pay | Admitting: Family Medicine

## 2023-12-08 ENCOUNTER — Ambulatory Visit: Payer: Medicare HMO

## 2023-12-12 NOTE — Therapy (Signed)
OUTPATIENT PHYSICAL THERAPY TREATMENT   Patient Name: Rachel Torres MRN: [id number] DOB:September 15, 1956, 67 y.o., female Today's Date: 12/13/2023  END OF SESSION:  PT End of Session - 12/13/23 0930     Visit Number 16    Number of Visits 29    Date for PT Re-Evaluation 01/24/24    Authorization Type [employer]  Medicare HMO    Authorization Time Period 09/05/23-10/31/23    Progress Note Due on Visit 10    PT Start Time 0930    PT Stop Time 1014    PT Time Calculation (min) 44 min    Equipment Utilized During Treatment Gait belt    Activity Tolerance Patient tolerated treatment well    Behavior During Therapy WFL for tasks assessed/performed                       Past Medical History:  Diagnosis Date   AAA (abdominal aortic aneurysm) (HCC)    a. Chronic w/o evidence of aneurysmal dil on CTA 01/2016.   Alcohol abuse    Allergy    Anemia    Anxiety    Asthma    CAD (coronary artery disease)    a. 08/2010 s/p CABG x 1 (VG->RCA) @ time of Ao dissection repair; b. 01/2016 Lexiscan MV: mid antsept/apical defect w/ ? peri-infarct ischemia-->likely attenuation-->Med Rx.   Chewing difficulty    Chronic combined systolic (congestive) and diastolic (congestive) heart failure (HCC)    a. 2012 EF 30-35%; b. 04/2015 EF 40-45%; c. 01/2016 Echo: Ef 55-65%, nl AoV bioprosthesis, nl RV, nl PASP.   Constipation    Depression    Edema of both lower extremities    Gallbladder sludge    GERD (gastroesophageal reflux disease)    Heart failure (HCC)    Heart valve problem    History of Bicuspid Aortic Valve    a. 08/2010 s/p AVR @ time of Ao dissection repair; b. 01/2016 Echo: Ef 55-65%, nl AoV bioprosthesis, nl RV, nl PASP.   Hx of repair of dissecting thoracic aortic aneurysm, Stanford type A    a. 08/2010 s/p repair with AVR and VG->RCA; b. 01/2016 CTA: stable appearance of Asc Thoracic Aortic graft. Opacification of flase lumen of chronic abd Ao dissection w/ retrograde flow through  lumbar arteries. No aneurysmal dil of flase lumen.   Hyperlipidemia    Hypertension    Hypertensive heart disease    IBS (irritable bowel syndrome)    Internal hemorrhoids    Kidney problem    Marfan syndrome    pt denies   Migraine    Obstructive sleep apnea    Osteoarthritis    Osteoporosis    Palpitations    Pneumonia    RA (rheumatoid arthritis) (HCC)    a. Followed by Dr. Mallie Mussel   Past Surgical History:  Procedure Laterality Date   ABDOMINAL AORTIC ANEURYSM REPAIR     CARDIAC CATHETERIZATION  2001   CESAREAN SECTION  1986 & 1991   CHOLECYSTECTOMY     COLONOSCOPY     ESOPHAGOGASTRODUODENOSCOPY (EGD) WITH PROPOFOL N/A 05/08/2020   Procedure: ESOPHAGOGASTRODUODENOSCOPY (EGD) WITH PROPOFOL;  Surgeon: Pasty Spillers, MD;  Location: [facility] END[facility];  Service: Endoscopy;  Laterality: N/A;   FRACTURE SURGERY Right    shoulder replacement   HEMORRHOID BANDING     ORIF DISTAL RADIUS FRACTURE Left    UPPER GASTROINTESTINAL ENDOSCOPY     Patient Active Problem List   Diagnosis Date Noted   Acute bronchitis  11/22/2023   Abnormal metabolism 11/14/2023   Weight regain after inital weight loss 11/14/2023   Multiple closed tarsal fractures of left foot, sequela 07/12/2023   Arm contusion, right, sequela 07/12/2023   Lumbar disc disease 07/12/2023   Atrial fibrillation (HCC) 06/09/2023   Paresthesia 06/03/2023   BMI 34.0-34.9,adult 02/17/2023   Obesity, Beginning BMI 35.45 02/17/2023   Tinnitus 01/14/2023   Change in vision 01/14/2023   Colon cancer screening 01/14/2023   Personal history of fall 01/14/2023   Problem related to unspecified psychosocial circumstances 12/28/2022   Person encountering health services to consult on behalf of another person 12/28/2022   Palpitation 12/10/2022   Fatigue 10/26/2022   B12 deficiency 10/26/2022   Right shoulder pain 09/15/2022   Depression 09/14/2022   Class 2 severe obesity with serious comorbidity and body mass index (BMI) of  35.0 to 35.9 in adult [facility]) 09/14/2022   Other constipation 05/12/2022   Medicare annual wellness visit, initial 07/17/2021   TMJ (dislocation of temporomandibular joint) 07/17/2021   Encounter for screening mammogram for breast cancer 06/05/2021   Diarrhea 04/11/2020   Epigastric pain 02/19/2020   Dark stools 02/19/2020   History of atrial fibrillation 02/05/2019   Shortness of breath 01/27/2019   Irregular surface of cornea 12/25/2018   HPV (human papilloma virus) infection 12/01/2016   Screening mammogram, encounter for 11/29/2016   Estrogen deficiency 11/29/2016   Encounter for routine gynecological examination 11/29/2016   Grade III hemorrhoids 10/06/2016   Tachycardia 08/14/2016   Hemorrhoids 08/13/2016   Atypical migraine 08/03/2016   CAD (coronary artery disease)    AAA (abdominal aortic aneurysm) (HCC)    Chronic combined systolic (congestive) and diastolic (congestive) heart failure (HCC)    Prediabetes 02/11/2016   Generalized anxiety disorder 12/08/2015   Panic attacks 07/28/2015   Chest pain 07/09/2013   Sensorineural hearing loss 03/27/2013   Vitamin D deficiency 07/03/2012   H/O aortic dissection 05/12/2012   Routine general medical examination at a health care facility 09/20/2011   Atypical chest pain 09/15/2011   S/P AVR (aortic valve replacement) 01/07/2011   CORONARY ARTERY BYPASS GRAFT, HX OF 01/07/2011   Arthritis 12/20/2010   Back pain 12/15/2010   H/O dissecting abdominal aortic aneurysm repair 08/24/2010   IRRITABLE BOWEL SYNDROME 09/09/2009   Obstructive sleep apnea 08/04/2009   External hemorrhoid 06/26/2009   Osteoporosis 01/09/2009   Hyperlipidemia 07/26/2007   Depression with anxiety 07/26/2007   Essential hypertension 07/26/2007   Allergic rhinitis 07/26/2007   Asthma 07/26/2007   GERD 07/26/2007   Osteoarthritis 07/26/2007   Ocular migraine 07/26/2007   Mild intermittent asthma without complication 07/26/2007    PCP: [name] MD    REFERRING PROVIDER: Stormy Card  REFERRING DIAG: L rotator cuff   THERAPY DIAG:  Left shoulder pain, unspecified chronicity  Stiffness of left shoulder, not elsewhere classified  Abnormal posture  Other abnormalities of gait and mobility  Rationale for Evaluation and Treatment: Rehabilitation  ONSET DATE: Feb 2024  SUBJECTIVE:  SUBJECTIVE STATEMENT: Patient reports her left leg is feeling week. Missed last session due to illness.   Hand dominance: Left  PERTINENT HISTORY: Ridhima Demerath is a 66yoF familiar to our services. Pt referred to PT for strengthening of chronic left shoulder DJD, known remote RC injury. Recently fell and broke four metatarsals, since cleared from use of CAM rocker. She was treated successfully in past for right shoulder pain and has past medical history of Right shoulder surgery replacement. 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 PMH aorta type A dissection in sept 2011 s/p aortic valve replacement, bypass of R coronary artery, anemia, anxiety, CAD, CHF, depression, GERD, Heart failure, HLD, HTN, IBS, migraine, osteoarthritis, osteoporosis, RA, back pain.    Back pain:  Patient has new referral for radiculopathy, lumbar region. MRI showed degeneration of L1-2 and L2-3 with some disc space collapse and minor retrolisthesis of L2-3. Issue began in February, felt numbness in L thigh. No cause known, but she was at a museum and noticed it was numb. Sometimes L foot numbness and toe numbness of 2nd and 3rd toe causing tripping. This caused her to trip July 4th and break five metatarsals. Some minor pain in the low pain noted. Pain noted to be 2-3/10 Pain in hip 3-4/10 of L hip.    PAIN:  Are you having pain? Yes, 3/10 pain in lateral deltoids in the  shoulder.  PRECAUTIONS: None  FALLS:  Has patient fallen in last 6 months? Yes. Number of falls 2  PATIENT GOALS:to lift more than 3lb with LUE, be able to eat a meal without arm being tired.    OBJECTIVE:  DIAGNOSTIC FINDINGS:  IMPRESSION: Mild degenerative change in the cervical spine without acute fracture or traumatic subluxation.   IMPRESSION: Full width, full width tears of the supraspinatus and infraspinatus tendons with retraction to the glenoid. Grade 2/3 infraspinatus atrophy.   Moderate tendinosis of the distal subscapularis tendon with articular sided fraying of the cephalad fibers.   Moderate intra-articular long head biceps tendinosis with fraying and possible partial tearing.   Mild glenohumeral and moderate AC joint osteoarthritis.   Degenerative superior labral tearing anteriorly and posteriorly.   PATIENT SURVEYS:  Quick Dash 50%  and FOTO 46%   COGNITION: Overall cognitive status: Within functional limits for tasks assessed                                  SENSATION: WFL   POSTURE: Slight rounded shoulders and shoulder elevation    UPPER EXTREMITY ROM:    Active ROM Right eval 9/16: recert RUE Left eval 9/16 RECERT LUE  Shoulder flexion 64 122 91 92  Shoulder extension        Shoulder abduction 95 88 52 82  Shoulder adduction        Shoulder internal rotation Unable to test at 90 61 degrees 5 40  Shoulder external rotation 18at side of body -5 degrees at 90 degrees 34 35  Elbow flexion [facility]  WFL   Elbow extension [facility]  WFL   Wrist flexion        Wrist extension        Wrist ulnar deviation        Wrist radial deviation        Wrist pronation        Wrist supination        (Blank rows = not tested)   UPPER EXTREMITY  MMT:   MMT Right eval Left eval  Shoulder flexion      Shoulder extension      Shoulder abduction      Shoulder adduction      Shoulder internal rotation      Shoulder external rotation      Middle trapezius       Lower trapezius      Elbow flexion      Elbow extension      Wrist flexion      Wrist extension      Wrist ulnar deviation      Wrist radial deviation      Wrist pronation      Wrist supination      Grip strength (lbs) 30 35  (Blank rows = not tested) Unable to test strength due to pain with motion   TRUNK ROM: Trunk Flexion 65  Trunk Extension 7  Trunk R SB Limited 25%  Trunk L SB WFL  Trunk R rotation WFL  Trunk L rotation Limited 25%    Ankle ROM Seated: Motion: AROM seated Right Left  Plantarflexion 4+ 3  Dorsiflexion 4+ 4  Inversion 4 3+  Eversion 4+ 3+    SHOULDER SPECIAL TESTS: Impingement tests:  unable to test due to pain in starting position SLAP lesions:  unable to test due to pain in starting position Instability tests:  unable to test due to pain in starting position Rotator cuff assessment:  unable to test due to pain in starting position Biceps assessment:  unable to test due to pain in starting position   LOW BACK SPECIAL TEST: SLR: negative  Scour test: negative FABER: negative Pelvic compression/distraction: negative  Slump test positive LLE  6 min walk test: 1160 ft increased left foot drag and antalgic gait pattern with foot eversion    JOINT MOBILITY TESTING:  AP: painful PA: painful Inferior: painful Distraction: reduces pain   Back mobilization hypomobile and painful to grade II mobilizations    PALPATION:  Tenderness to all joint regions of GH     TODAY'S TREATMENT: DATE: 12/13/23  Manual:  -Lateral hip left distraction x4, improves symptoms  -Figure four left hip distraction x4, Grade II mobilizations thoracic and lumbar spine x 7 minutes  Therex: Supine pelvic tilts x10 with two sec hold  Swiss ball trA press 15x 3 second holds Swiss ball activation with heel slide 10x  RTB abduction with posterior tilt 15x Adduction ball squeeze with posterior tilt 15x  Sidelying brush for rotation 10x   Trigger Point Dry  Needling  Subsequent Treatment: Instructions provided previously at initial dry needling treatment.   Patient Verbal Consent Given: Yes Education Handout Provided: Yes Muscles Treated: upper trap and L It band  Electrical Stimulation Performed: No Treatment Response/Outcome: decreaed tension    PATIENT EDUCATION: Education details: goals, POC  Person educated: Patient Education method: Explanation, Demonstration, Tactile cues, Verbal cues, and Handouts Education comprehension: verbalized understanding, returned demonstration, verbal cues required, and tactile cues required  HOME EXERCISE PROGRAM:  Access Code: [email or web address] URL: [email or web address] Date: 10/03/2023 Prepared by: Precious Bard  Exercises - Seated Scapular Retraction  - 1 x daily - 7 x weekly - 2 sets - 10 reps - 5 hold - Neck Sidebending  - 1 x daily - 7 x weekly - 2 sets - 2 reps - 30 hold - Supine Isometric Shoulder Extension with Towel  - 1 x daily - 7 x weekly - 2 sets - 10 reps - 5  hold - Supine Isometric Shoulder Adduction with Towel Roll  - 1 x daily - 7 x weekly - 2 sets - 10 reps - 5 hold - Seated Bilateral Shoulder Flexion Towel Slide at Table Top  - 1 x daily - 7 x weekly - 1 sets - 3 reps - 30 hold - Supine Posterior Pelvic Tilt  - 1 x daily - 7 x weekly - 2 sets - 5 reps - 30 hold - Supine LE Neural Mobilization  - 1 x daily - 7 x weekly - 2 sets - 10 reps - 5 hold - Seated Ankle Alphabet  - 1 x daily - 7 x weekly - 2 sets - 1 reps - 5 hold  ASSESSMENT:  CLINICAL IMPRESSION:  Patient is highly motivated throughout session. Core stabilization tolerated well with some fatigue noted. Patient given TDN handout to comply with new regulations and verbalized understanding.  Pt will continue to benefit from skilled therapy to address remaining deficits in order to improve overall QoL and return to PLOF.      OBJECTIVE IMPAIRMENTS: cardiopulmonary status limiting activity, decreased activity  tolerance, decreased mobility, increased fascial restrictions, impaired perceived functional ability, increased muscle spasms, impaired flexibility, impaired UE functional use, improper body mechanics, postural dysfunction, and pain.   ACTIVITY LIMITATIONS: carrying, lifting, bed mobility, bathing, dressing, self feeding, reach over head, and caring for others  PARTICIPATION LIMITATIONS: meal prep, cleaning, laundry, personal finances, interpersonal relationship, driving, shopping, community activity, yard work, church, and caregiving for husband  PERSONAL FACTORS: Age, Fitness, Past/current experiences, Time since onset of injury/illness/exacerbation, and 3+ comorbidities: aorta type A dissection in sept 2011 with aortic valve replacement bypass of R coronary artery, AAA, anemia, anxiety, CAD, CHF, depression, GERD, Heart failure, HLD, HTN, IBS, Kidney problems, migraine, osteoarthritis, osteoporosis, RA, ocular migraines, back pain  are also affecting patient's functional outcome.   REHAB POTENTIAL: Fair    CLINICAL DECISION MAKING: Evolving/moderate complexity  EVALUATION COMPLEXITY: Moderate   GOALS: Goals reviewed with patient? Yes  SHORT TERM GOALS: Target date: 12/27/2023  Patient will be independent in home exercise program to improve strength/mobility for better functional independence with ADLs. Baseline:7/23: HEP given 9/16: compliant 1x/week  Goal status: On going     LONG TERM GOALS: Target date: 01/24/2024    Patient will increase FOTO score to equal to or greater than  57%   to demonstrate statistically significant improvement in mobility and quality of life.  Baseline: 7/22: 46 9/16: 50% 10/7: 40% 12/10: 57% Goal status: MET  2.  Patient will improve shoulder AROM to > 140 degrees of flexion, scaption, and abduction for improved ability to perform overhead activities. Baseline: 7/22: see above 9/16: see above 10/7 : defer to exacerbation 12/10: shoulder abduction  >140, flexion < 140 , assess scaption next visit  Goal status: Partially Met   3.  Patient will demonstrate adequate shoulder ROM and strength to be able to shave and dress independently with pain less than 3/10. Baseline: 7/23: 6/10 pain with dressing, limited ROM, unable to test strength 9/16: 3/10 pain with dressing  Goal status: MET  4.  Patient will decrease Quick DASH score by > 8 points demonstrating reduced self-reported upper extremity disability for L and R shoulder  Baseline: 7/23: L shoulder 50% 9/16: R=54.54 % L 66% L 81% R 24% 12/10: R: 43.2% L: 56.8%  Goal status: IN PROGRESS  5.  Patient will be able to eat an entire meal without resting arm demonstrating improved functional strength  Baseline: 7/23: has to rest while eating due to pain. 9/16: able to make halfway through meal 10/7: can make it about halfway through meal, some meals can get 3/4s through meal 12/10: improved, still has to rest every once in a while  Goal status: Partially Met  6.  Patient will be able to stand 15 minutes without her LLE going numb allowing for ADLS such as showering and iADLs such as cooking Baseline: 5 minutes 12/10: will assess next visit  Goal status: IN PROGRESS  7. Patient will increase six minute walk test distance to >1500 for progression to community ambulator and improve gait ability Baseline: 1160 ft  12/10: 1010 ft  Goal status: IN PROGRESS  PLAN:  PT FREQUENCY: 2x/week  PT DURATION: 8 weeks  PLANNED INTERVENTIONS: Therapeutic exercises, Therapeutic activity, Neuromuscular re-education, Balance training, Gait training, Patient/Family education, Self Care, Joint mobilization, Vestibular training, [place] repositioning, Visual/preceptual remediation/compensation, Dry Needling, Electrical stimulation, Spinal mobilization, Cryotherapy, Moist heat, Compression bandaging, scar mobilization, Splintting, Taping, Traction, Ultrasound, Biofeedback, Ionotophoresis 4mg /ml Dexamethasone,  Manual therapy, and Re-evaluation  PLAN FOR NEXT SESSION:   Continue with LUE strengthening  L ankle strengthening, distraction, core activation, reassess scaption and flexion ROM, make HEP     Precious Bard PT  Physical Therapist - [name]  [facility]  12/13/23, 10:14 AM

## 2023-12-13 ENCOUNTER — Ambulatory Visit: Payer: Medicare HMO

## 2023-12-13 DIAGNOSIS — M25512 Pain in left shoulder: Secondary | ICD-10-CM | POA: Diagnosis not present

## 2023-12-13 DIAGNOSIS — G8929 Other chronic pain: Secondary | ICD-10-CM | POA: Diagnosis not present

## 2023-12-13 DIAGNOSIS — R293 Abnormal posture: Secondary | ICD-10-CM | POA: Diagnosis not present

## 2023-12-13 DIAGNOSIS — M5459 Other low back pain: Secondary | ICD-10-CM | POA: Diagnosis not present

## 2023-12-13 DIAGNOSIS — M25612 Stiffness of left shoulder, not elsewhere classified: Secondary | ICD-10-CM

## 2023-12-13 DIAGNOSIS — R2689 Other abnormalities of gait and mobility: Secondary | ICD-10-CM | POA: Diagnosis not present

## 2023-12-13 DIAGNOSIS — M25511 Pain in right shoulder: Secondary | ICD-10-CM | POA: Diagnosis not present

## 2023-12-13 DIAGNOSIS — M6281 Muscle weakness (generalized): Secondary | ICD-10-CM | POA: Diagnosis not present

## 2023-12-13 DIAGNOSIS — R278 Other lack of coordination: Secondary | ICD-10-CM | POA: Diagnosis not present

## 2023-12-17 DIAGNOSIS — I7102 Dissection of abdominal aorta: Secondary | ICD-10-CM | POA: Diagnosis not present

## 2023-12-17 DIAGNOSIS — J4520 Mild intermittent asthma, uncomplicated: Secondary | ICD-10-CM | POA: Diagnosis not present

## 2023-12-17 DIAGNOSIS — I7772 Dissection of iliac artery: Secondary | ICD-10-CM | POA: Diagnosis not present

## 2023-12-17 DIAGNOSIS — I509 Heart failure, unspecified: Secondary | ICD-10-CM | POA: Diagnosis not present

## 2023-12-17 DIAGNOSIS — I4891 Unspecified atrial fibrillation: Secondary | ICD-10-CM | POA: Diagnosis not present

## 2023-12-17 DIAGNOSIS — K219 Gastro-esophageal reflux disease without esophagitis: Secondary | ICD-10-CM | POA: Diagnosis not present

## 2023-12-17 DIAGNOSIS — M545 Low back pain, unspecified: Secondary | ICD-10-CM | POA: Diagnosis not present

## 2023-12-17 DIAGNOSIS — F410 Panic disorder [episodic paroxysmal anxiety] without agoraphobia: Secondary | ICD-10-CM | POA: Diagnosis not present

## 2023-12-17 DIAGNOSIS — M549 Dorsalgia, unspecified: Secondary | ICD-10-CM | POA: Diagnosis not present

## 2023-12-17 DIAGNOSIS — Z8679 Personal history of other diseases of the circulatory system: Secondary | ICD-10-CM | POA: Diagnosis not present

## 2023-12-17 DIAGNOSIS — I7103 Dissection of thoracoabdominal aorta: Secondary | ICD-10-CM | POA: Diagnosis not present

## 2023-12-17 DIAGNOSIS — I110 Hypertensive heart disease with heart failure: Secondary | ICD-10-CM | POA: Diagnosis not present

## 2023-12-17 DIAGNOSIS — M069 Rheumatoid arthritis, unspecified: Secondary | ICD-10-CM | POA: Diagnosis not present

## 2023-12-18 DIAGNOSIS — I7772 Dissection of iliac artery: Secondary | ICD-10-CM | POA: Diagnosis not present

## 2023-12-18 DIAGNOSIS — R079 Chest pain, unspecified: Secondary | ICD-10-CM | POA: Diagnosis not present

## 2023-12-18 DIAGNOSIS — I7103 Dissection of thoracoabdominal aorta: Secondary | ICD-10-CM | POA: Diagnosis not present

## 2023-12-18 DIAGNOSIS — I7102 Dissection of abdominal aorta: Secondary | ICD-10-CM | POA: Diagnosis not present

## 2023-12-19 ENCOUNTER — Encounter (INDEPENDENT_AMBULATORY_CARE_PROVIDER_SITE_OTHER): Payer: Self-pay | Admitting: Family Medicine

## 2023-12-19 DIAGNOSIS — M545 Low back pain, unspecified: Secondary | ICD-10-CM | POA: Diagnosis not present

## 2023-12-19 DIAGNOSIS — M7918 Myalgia, other site: Secondary | ICD-10-CM | POA: Diagnosis not present

## 2023-12-20 ENCOUNTER — Ambulatory Visit: Payer: Medicare HMO | Admitting: Psychology

## 2023-12-20 ENCOUNTER — Ambulatory Visit (INDEPENDENT_AMBULATORY_CARE_PROVIDER_SITE_OTHER): Payer: Medicare HMO | Admitting: Family Medicine

## 2023-12-20 ENCOUNTER — Ambulatory Visit: Payer: Medicare HMO

## 2023-12-20 DIAGNOSIS — F331 Major depressive disorder, recurrent, moderate: Secondary | ICD-10-CM | POA: Diagnosis not present

## 2023-12-20 NOTE — Progress Notes (Signed)
 [facility] Counselor/Therapist Progress Note  Patient ID: Rachel Torres, MRN: [id number],    Date: 12/20/2023  Time Spent: 10:00am-10:45am  45 minutes   Treatment Type: Individual Therapy  Reported Symptoms: stress  Mental Status Exam: Appearance:  Casual     Behavior: Appropriate  Motor: Normal  Speech/Language:  Normal Rate  Affect: Appropriate  Mood: normal  Thought process: normal  Thought content:   WNL  Sensory/Perceptual disturbances:   WNL  Orientation: oriented to person, place, time/date, and situation  Attention: Good  Concentration: Good  Memory: WNL  Fund of knowledge:  Good  Insight:   Good  Judgment:  Good  Impulse Control: Good   Risk Assessment: Danger to Self:  No Self-injurious Behavior: No Danger to Others: No Duty to Warn:no Physical Aggression / Violence:No  Access to Firearms a concern: No  Gang Involvement:No   Subjective: Pt present for face-to-face individual therapy via video.  Pt consents to telehealth video session and is aware of limitations and benefits of virtual sessions.  Location of pt: home Location of therapist: home office.  Pt talked about her home renovation.  They are in the middle of major work and it is loud and frustrating. Pt talked about Christmas.  She enjoyed being with family.   She got to see her grand baby.   Pt talked about her health.   She spent the night in the ER.   She pulled a muscle in her back.   Pt talked about her husband Rachel Torres.    His care giver did not show up today.   Pt states she is feeling overwhelmed bc everyone depends on her.  Addressed how pt can set healthy boundaries.   Worked on self care strategies.  Provided supportive therapy.  Interventions: Cognitive Behavioral Therapy and Insight-Oriented  Diagnosis:  F33.1  Plan of Care: Recommend ongoing therapy.   Pt participated in setting treatment goals.   Pt needs a place to talk about stressors.  Plan to meet every two  weeks.    Treatment Plan (target date: 03/23/2024) Client Abilities/Strengths  Pt is bright, engaging, and motivated for therapy.   Client Treatment Preferences  Individual therapy.  Client Statement of Needs  Improve coping skills.  Have a place to talk about stressors.   Symptoms  Depressed or irritable mood. Unresolved grief issues.  Problems Addressed  Unipolar Depression Goals 1. Alleviate depressive symptoms and return to previous level of effective functioning. 2. Appropriately grieve the loss in order to normalize mood and to return to previously adaptive level of functioning. Objective Learn and implement behavioral strategies to overcome depression. Target Date: 2024-03-23 Frequency: Biweekly  Progress: 40 Modality: individual  Related Interventions Engage the client in behavioral activation, increasing his/her activity level and contact with sources of reward, while identifying processes that inhibit activation.  Use behavioral techniques such as instruction, rehearsal, role-playing, role reversal, as needed, to facilitate activity in the client's daily life; reinforce success. Assist the client in developing skills that increase the likelihood of deriving pleasure from behavioral activation (e.g., assertiveness skills, developing an exercise plan, less internal/more external focus, increased social involvement); reinforce success. Objective Identify important people in life, past and present, and describe the quality, good and poor, of those relationships. Target Date: 2024-03-23 Frequency: Biweekly  Progress: 40 Modality: individual  Related Interventions Conduct Interpersonal Therapy beginning with the assessment of the client's interpersonal inventory of important past and present relationships; develop a case formulation linking depression to  grief, interpersonal role disputes, role transitions, and/or interpersonal deficits). Objective Learn and implement  problem-solving and decision-making skills. Target Date: 2024-03-23 Frequency: Biweekly  Progress: 40 Modality: individual  Related Interventions Conduct Problem-Solving Therapy using techniques such as psychoeducation, modeling, and role-playing to teach client problem-solving skills (i.e., defining a problem specifically, generating possible solutions, evaluating the pros and cons of each solution, selecting and implementing a plan of action, evaluating the efficacy of the plan, accepting or revising the plan); role-play application of the problem-solving skill to a real life issue. Encourage in the client the development of a positive problem orientation in which problems and solving them are viewed as a natural part of life and not something to be feared, despaired, or avoided. 3. Develop healthy interpersonal relationships that lead to the alleviation and help prevent the relapse of depression. 4. Develop healthy thinking patterns and beliefs about self, others, and the world that lead to the alleviation and help prevent the relapse of depression. 5. Recognize, accept, and cope with feelings of depression. Diagnosis F33.1  Conditions For Discharge Achievement of treatment goals and objectives   Veva Alma, LCSW

## 2023-12-21 ENCOUNTER — Emergency Department
Admission: EM | Admit: 2023-12-21 | Discharge: 2023-12-21 | Disposition: A | Discharge status: Home / Self Care | Payer: Medicare HMO | Attending: Emergency Medicine | Admitting: Emergency Medicine

## 2023-12-21 ENCOUNTER — Other Ambulatory Visit: Payer: Self-pay

## 2023-12-21 DIAGNOSIS — M62830 Muscle spasm of back: Secondary | ICD-10-CM | POA: Diagnosis not present

## 2023-12-21 DIAGNOSIS — I110 Hypertensive heart disease with heart failure: Secondary | ICD-10-CM | POA: Insufficient documentation

## 2023-12-21 DIAGNOSIS — M549 Dorsalgia, unspecified: Secondary | ICD-10-CM | POA: Diagnosis not present

## 2023-12-21 DIAGNOSIS — I509 Heart failure, unspecified: Secondary | ICD-10-CM | POA: Insufficient documentation

## 2023-12-21 DIAGNOSIS — M546 Pain in thoracic spine: Secondary | ICD-10-CM | POA: Diagnosis present

## 2023-12-21 DIAGNOSIS — R531 Weakness: Secondary | ICD-10-CM | POA: Diagnosis not present

## 2023-12-21 DIAGNOSIS — Z951 Presence of aortocoronary bypass graft: Secondary | ICD-10-CM | POA: Insufficient documentation

## 2023-12-21 DIAGNOSIS — I2510 Atherosclerotic heart disease of native coronary artery without angina pectoris: Secondary | ICD-10-CM | POA: Diagnosis not present

## 2023-12-21 MED ORDER — BACLOFEN 10 MG PO TABS: 10.0000 mg | ORAL_TABLET | Freq: Every day | ORAL | 1 refills | Status: DC

## 2023-12-21 MED ORDER — KETOROLAC TROMETHAMINE 15 MG/ML IJ SOLN
15.0000 mg | Freq: Once | INTRAMUSCULAR | Status: AC
Start: 2023-12-21 — End: 2023-12-21
  Administered 2023-12-21: 15 mg via INTRAMUSCULAR
  Filled 2023-12-21: qty 1

## 2023-12-21 MED ORDER — MELOXICAM 15 MG PO TABS: 15.0000 mg | ORAL_TABLET | Freq: Every day | ORAL | 0 refills | Status: DC

## 2023-12-21 MED ORDER — BACLOFEN 10 MG PO TABS
10.0000 mg | ORAL_TABLET | Freq: Once | ORAL | Status: AC
  Administered 2023-12-21: 10 mg via ORAL
  Filled 2023-12-21: qty 1

## 2023-12-21 NOTE — ED Triage Notes (Signed)
 Pt comes via [facility] from home with c/o muscle spasm in mid back. Pt states this all started this past Friday. Pt went to ER on Saturday and then later went to neurology on Monday for muscle relaxer. Pt states pain is worse and meds aren't helping.

## 2023-12-21 NOTE — ED Provider Notes (Signed)
 [facility] Emergency Department Provider Note     Event Date/Time   First MD Initiated Contact with Patient 12/21/23 1619     (approximate)   History   Back Pain   HPI  Rachel Torres is a 68 y.o. female with a PMH of type A aortic dissection (CTA stable 11/2923) (08/23/10 s/p aortic valve replacement, ascending aorta graft, and CABG to RCA), A-fib, CAD, CHF, HLD, HTN  presents to the ED with complaint of mid back pain described as muscle spasms with movement.  Patient was evaluated on 12/30 by her neurologist who prescribed methocarbamol with no relief. Denies chest pain, shortness of breath, fever, saddle anesthesia and loss of bladder and bowel control.     Physical Exam   Triage Vital Signs: ED Triage Vitals  Encounter Vitals Group     BP 12/21/23 1618 131/63     Systolic BP Percentile --      Diastolic BP Percentile --      Pulse Rate 12/21/23 1618 84     Resp 12/21/23 1618 18     Temp 12/21/23 1618 98 F (36.7 C)     Temp src --      SpO2 12/21/23 1618 95 %     Weight 12/21/23 1615 175 lb (79.4 kg)     Height 12/21/23 1615 5' 5 (1.651 m)     Head Circumference --      Peak Flow --      Pain Score 12/21/23 1615 10     Pain Loc --      Pain Education --      Exclude from Growth Chart --     Most recent vital signs: Vitals:   12/21/23 1618  BP: 131/63  Pulse: 84  Resp: 18  Temp: 98 F (36.7 C)  SpO2: 95%    General Well appearing, awake, no distress.  HEENT NCAT. PERRL. EOMI. No rhinorrhea. Mucous membranes are moist.  CV:  Good peripheral perfusion. RRR RESP:  Normal effort. LCTAB ABD:  No distention. Nontender. No masses or organomegaly. BACK:  Spinous process is midline without deformity. Tenderness over midline T8 region and bilateral paraspinal muscles  MSK:   Full ROM in all joints. No swelling, deformity or tenderness.  NEURO: Cranial nerves II-XII intact. No focal deficits. Sensation and motor function intact. 5/5  muscle strength of UE & LE. Gait is steady.   ED Results / Procedures / Treatments   Labs (all labs ordered are listed, but only abnormal results are displayed) Labs Reviewed - No data to display  No results found.  PROCEDURES:  Critical Care performed: No  Procedures   MEDICATIONS ORDERED IN ED: Medications  ketorolac  (TORADOL ) 15 MG/ML injection 15 mg (15 mg Intramuscular Given 12/21/23 1739)  baclofen  (LIORESAL ) tablet 10 mg (10 mg Oral Given 12/21/23 1738)     IMPRESSION / MDM / ASSESSMENT AND PLAN / ED COURSE  I reviewed the triage vital signs and the nursing notes.                               68 y.o. female presents to the emergency department for evaluation and treatment of back pain without injury. See HPI for further details.   Differential diagnosis includes, but is not limited to spasms, strain, fracture highly unlikely  Patient's presentation is most consistent with acute complicated illness / injury requiring diagnostic workup.  Patient is  alert and oriented.  She is hemodynamically stable.  She is well-appearing and in no acute distress.  Physical exam findings are as stated above.  Chart reviewed.  CTA performed 12/29 showing stable chronic aortic dissection extending into the L common iliac artery. She has a scheduled outpatient appointment with vascular surgery next week.   Baclofen  and Toradol  provided for pain management in ED.  Reports improvement in symptoms and is able to  finally relax.  Discussed further imaging given patients history.  Patient does not want to move forward with imaging in which I find reasonable given absence of acute symptoms.   The patient is in stable and satisfactory condition for discharge home. Encouraged to follow up with her vascular surgeon, and primary care for further management. ED precautions discussed. All questions and concerns were addressed during this ED visit.     FINAL CLINICAL IMPRESSION(S) / ED DIAGNOSES    Final diagnoses:  Muscle spasm of back   Rx / DC Orders   ED Discharge Orders          Ordered    baclofen  (LIORESAL ) 10 MG tablet  Daily        12/21/23 1831    meloxicam  (MOBIC ) 15 MG tablet  Daily        12/21/23 1831           Note:  This document was prepared using Dragon voice recognition software and may include unintentional dictation errors.    Margrette, Sherlie Boyum A, PA-C 12/21/23 2004    Ernest Ronal BRAVO, MD 12/28/23 [id number]

## 2023-12-21 NOTE — Discharge Instructions (Signed)
 Your evaluated in the ED for muscle spasms.  Please continue to follow-up with your scheduled appointment with vascular and your primary care for further management as discussed.

## 2023-12-27 ENCOUNTER — Ambulatory Visit: Payer: Medicare HMO

## 2023-12-27 DIAGNOSIS — I71019 Dissection of thoracic aorta, unspecified: Secondary | ICD-10-CM | POA: Diagnosis not present

## 2023-12-28 ENCOUNTER — Ambulatory Visit: Payer: Medicare HMO | Admitting: Psychology

## 2023-12-28 DIAGNOSIS — F331 Major depressive disorder, recurrent, moderate: Secondary | ICD-10-CM | POA: Diagnosis not present

## 2023-12-28 NOTE — Progress Notes (Signed)
 [facility] Counselor/Therapist Progress Note  Patient ID: Rachel Torres, MRN: [id number],    Date: 12/28/2023  Time Spent: 10:00am-10:45am  45 minutes   Treatment Type: Individual Therapy  Reported Symptoms: stress  Mental Status Exam: Appearance:  Casual     Behavior: Appropriate  Motor: Normal  Speech/Language:  Normal Rate  Affect: Appropriate  Mood: normal  Thought process: normal  Thought content:   WNL  Sensory/Perceptual disturbances:   WNL  Orientation: oriented to person, place, time/date, and situation  Attention: Good  Concentration: Good  Memory: WNL  Fund of knowledge:  Good  Insight:   Good  Judgment:  Good  Impulse Control: Good   Risk Assessment: Danger to Self:  No Self-injurious Behavior: No Danger to Others: No Duty to Warn:no Physical Aggression / Violence:No  Access to Firearms a concern: No  Gang Involvement:No   Subjective: Pt present for face-to-face individual therapy via video.  Pt consents to telehealth video session and is aware of limitations and benefits of virtual sessions.  Location of pt: home Location of therapist: home office.  Pt talked about her health.  She is still in a lot of pain.   She went to the ER last week and had to call the ambulance to take her there bc her back was hurting her so badly.  Pt is not sleeping well bc of the pain.    Pt talked about her husband [name].     Pt was declined in patient respite for [name] bc there is not a facility that can take him.  Pt is disappointed and frustrated.   Pt and [name] will need to go to a hotel next week bc of their home renovations.  Pt is feeling very overwhelmed and disorganized.  Pt states she is feeling overwhelmed bc everyone depends on her.  Addressed how pt can set healthy boundaries.   Worked on self care strategies.  Provided supportive therapy.  Interventions: Cognitive Behavioral Therapy and Insight-Oriented  Diagnosis:  F33.1  Plan of Care:  Recommend ongoing therapy.   Pt participated in setting treatment goals.   Pt needs a place to talk about stressors.  Plan to meet every two weeks.    Treatment Plan (target date: 03/23/2024) Client Abilities/Strengths  Pt is bright, engaging, and motivated for therapy.   Client Treatment Preferences  Individual therapy.  Client Statement of Needs  Improve coping skills.  Have a place to talk about stressors.   Symptoms  Depressed or irritable mood. Unresolved grief issues.  Problems Addressed  Unipolar Depression Goals 1. Alleviate depressive symptoms and return to previous level of effective functioning. 2. Appropriately grieve the loss in order to normalize mood and to return to previously adaptive level of functioning. Objective Learn and implement behavioral strategies to overcome depression. Target Date: 2024-03-23 Frequency: Biweekly  Progress: 40 Modality: individual  Related Interventions Engage the client in behavioral activation, increasing his/her activity level and contact with sources of reward, while identifying processes that inhibit activation.  Use behavioral techniques such as instruction, rehearsal, role-playing, role reversal, as needed, to facilitate activity in the client's daily life; reinforce success. Assist the client in developing skills that increase the likelihood of deriving pleasure from behavioral activation (e.g., assertiveness skills, developing an exercise plan, less internal/more external focus, increased social involvement); reinforce success. Objective Identify important people in life, past and present, and describe the quality, good and poor, of those relationships. Target Date: 2024-03-23 Frequency: Biweekly  Progress: 40  Modality: individual  Related Interventions Conduct Interpersonal Therapy beginning with the assessment of the client's interpersonal inventory of important past and present relationships; develop a case formulation linking  depression to grief, interpersonal role disputes, role transitions, and/or interpersonal deficits). Objective Learn and implement problem-solving and decision-making skills. Target Date: 2024-03-23 Frequency: Biweekly  Progress: 40 Modality: individual  Related Interventions Conduct Problem-Solving Therapy using techniques such as psychoeducation, modeling, and role-playing to teach client problem-solving skills (i.e., defining a problem specifically, generating possible solutions, evaluating the pros and cons of each solution, selecting and implementing a plan of action, evaluating the efficacy of the plan, accepting or revising the plan); role-play application of the problem-solving skill to a real life issue. Encourage in the client the development of a positive problem orientation in which problems and solving them are viewed as a natural part of life and not something to be feared, despaired, or avoided. 3. Develop healthy interpersonal relationships that lead to the alleviation and help prevent the relapse of depression. 4. Develop healthy thinking patterns and beliefs about self, others, and the world that lead to the alleviation and help prevent the relapse of depression. 5. Recognize, accept, and cope with feelings of depression. Diagnosis F33.1  Conditions For Discharge Achievement of treatment goals and objectives   Rachel Alma, LCSW

## 2023-12-29 ENCOUNTER — Ambulatory Visit (INDEPENDENT_AMBULATORY_CARE_PROVIDER_SITE_OTHER): Payer: Medicare HMO

## 2023-12-29 ENCOUNTER — Other Ambulatory Visit: Payer: Self-pay

## 2023-12-29 ENCOUNTER — Telehealth: Payer: Self-pay

## 2023-12-29 ENCOUNTER — Ambulatory Visit (INDEPENDENT_AMBULATORY_CARE_PROVIDER_SITE_OTHER): Payer: Medicare HMO | Admitting: Family Medicine

## 2023-12-29 ENCOUNTER — Encounter: Payer: Self-pay | Admitting: Family Medicine

## 2023-12-29 VITALS — BP 124/78 | HR 84 | Temp 98.6°F | Ht 65.0 in | Wt 224.1 lb

## 2023-12-29 VITALS — Ht 65.0 in | Wt 175.0 lb

## 2023-12-29 DIAGNOSIS — Z1211 Encounter for screening for malignant neoplasm of colon: Secondary | ICD-10-CM

## 2023-12-29 DIAGNOSIS — G8929 Other chronic pain: Secondary | ICD-10-CM | POA: Diagnosis not present

## 2023-12-29 DIAGNOSIS — Z0000 Encounter for general adult medical examination without abnormal findings: Secondary | ICD-10-CM

## 2023-12-29 DIAGNOSIS — M549 Dorsalgia, unspecified: Secondary | ICD-10-CM

## 2023-12-29 DIAGNOSIS — M519 Unspecified thoracic, thoracolumbar and lumbosacral intervertebral disc disorder: Secondary | ICD-10-CM | POA: Diagnosis not present

## 2023-12-29 MED ORDER — NA SULFATE-K SULFATE-MG SULF 17.5-3.13-1.6 GM/177ML PO SOLN: 1.0000 | Freq: Once | ORAL | 0 refills | Status: AC

## 2023-12-29 MED ORDER — METHYLPREDNISOLONE ACETATE 40 MG/ML IJ SUSP
40.0000 mg | Freq: Once | INTRAMUSCULAR | Status: AC
  Administered 2023-12-29: 40 mg via INTRAMUSCULAR

## 2023-12-29 NOTE — Progress Notes (Signed)
 Subjective:   Rachel Torres is a 68 y.o. female who presents for Medicare Annual (Subsequent) preventive examination.  Visit Complete: Virtual I connected with  Rachel Torres on 12/29/23 by a audio enabled telemedicine application and verified that I am speaking with the correct person using two identifiers.  Patient Location: Home  Provider Location: Office/Clinic  I discussed the limitations of evaluation and management by telemedicine. The patient expressed understanding and agreed to proceed.  Vital Signs: Because this visit was a virtual/telehealth visit, some criteria may be missing or patient reported. Any vitals not documented were not able to be obtained and vitals that have been documented are patient reported.  Patient Medicare AWV questionnaire was completed by the patient on (not done); I have confirmed that all information answered by patient is correct and no changes since this date.  Cardiac Risk Factors include: advanced age (>43men, >80 women);dyslipidemia;hypertension;sedentary lifestyle    Objective:    Today's Vitals   12/29/23 0814 12/29/23 0815  Weight: 175 lb (79.4 kg)   Height: 5' 5 (1.651 m)   PainSc:  7    Body mass index is 29.12 kg/m.     12/29/2023    8:28 AM 12/21/2023    4:16 PM 07/11/2023    3:13 PM 01/27/2023    1:52 PM 12/28/2022   11:12 AM 10/29/2021    8:59 AM 02/24/2021    2:29 PM  Advanced Directives  Does Patient Have a Medical Advance Directive? Yes No Yes Yes No Yes Yes  Type of Estate Agent of [place];Living will  Living will;Healthcare Power of Attorney Living will;Healthcare Power of [employer] Power of [place];Living will Living will;Healthcare Power of Attorney  Does patient want to make changes to medical advance directive?      Yes (MAU/Ambulatory/Procedural Areas - Information given)   Copy of Healthcare Power of Attorney in Chart? No - copy requested  No - copy requested No - copy requested      Would patient like information on creating a medical advance directive?     No - Patient declined      Current Medications (verified) Outpatient Encounter Medications as of 12/29/2023  Medication Sig   acetaminophen  (TYLENOL ) 500 MG tablet Take 500 mg by mouth every 6 (six) hours as needed.   albuterol  (VENTOLIN  HFA) 108 (90 Base) MCG/ACT inhaler Inhale 1-2 puffs into the lungs every 6 (six) hours as needed for wheezing or shortness of breath.   ALPRAZolam  (XANAX ) 0.5 MG tablet Take 1 tablet (0.5 mg total) by mouth daily as needed for anxiety.   aspirin  EC 81 MG tablet Take 81 mg by mouth at bedtime.   atorvastatin  (LIPITOR) 20 MG tablet Take 20 mg by mouth. Four days per week   baclofen  (LIORESAL ) 10 MG tablet Take 1 tablet (10 mg total) by mouth daily.   benzonatate  (TESSALON ) 200 MG capsule Take 200 mg by mouth 3 (three) times daily as needed. (Patient not taking: Reported on 12/29/2023)   Calcium  Carbonate (CALCIUM  600 PO) Take 600 mg by mouth.   carvedilol  (COREG ) 12.5 MG tablet Take 1 tablet (12.5 mg total) by mouth 2 (two) times daily.   cetirizine  (ZYRTEC ) 10 MG tablet Take 1 tablet (10 mg total) by mouth daily as needed for allergies.   Cholecalciferol (VITAMIN D3) 50 MCG (2000 UT) CAPS Take 2,000 capsules by mouth. FOUR TIMES A WEEK   cyclobenzaprine  (FLEXERIL ) 10 MG tablet Take 0.5-1 tablets (5-10 mg total) by mouth  3 (three) times daily as needed. For headache   dextromethorphan-guaiFENesin  (MUCINEX  DM) 30-600 MG 12hr tablet Take 1 tablet by mouth 2 (two) times daily.   doxycycline  (ADOXA) 100 MG tablet Take 100 mg by mouth 2 (two) times daily. (Patient not taking: Reported on 12/29/2023)   ELIQUIS  5 MG TABS tablet Take 1 tablet (5 mg total) by mouth 2 (two) times daily.   ezetimibe  (ZETIA ) 10 MG tablet TAKE 1 TABLET EVERY DAY   fluticasone  (FLONASE ) 50 MCG/ACT nasal spray Place 2 sprays into both nostrils daily as needed for allergies or rhinitis.   furosemide  (LASIX ) 20 MG tablet  Take 2 tablets (40 mg total) by mouth daily.   hydrOXYzine (ATARAX) 25 MG tablet Take 25 mg by mouth 3 (three) times daily.   isosorbide  mononitrate (IMDUR ) 30 MG 24 hr tablet TAKE 1/2 TABLET EVERY DAY   lisinopril  (ZESTRIL ) 5 MG tablet Take 0.5 tablets (2.5 mg total) by mouth daily.   magnesium  gluconate (MAGONATE) 500 MG tablet Take 500 mg by mouth at bedtime.   Melatonin 5 MG SUBL Take 10 mg by mouth at bedtime as needed.   meloxicam  (MOBIC ) 15 MG tablet Take 1 tablet (15 mg total) by mouth daily.   methylPREDNISolone  (MEDROL  DOSEPAK) 4 MG TBPK tablet TAKE BY MOUTH AS DIRECTED (Patient not taking: Reported on 12/29/2023)   nitroGLYCERIN  (NITROSTAT ) 0.4 MG SL tablet Place 1 tablet (0.4 mg total) under the tongue every 5 (five) minutes as needed for chest pain.   omeprazole  (PRILOSEC) 40 MG capsule TAKE 1 CAPSULE EVERY DAY   polyethylene glycol powder (GLYCOLAX /MIRALAX ) 17 GM/SCOOP powder Take 17 g by mouth as needed.   potassium chloride  (KLOR-CON  M) 10 MEQ tablet Take 1 tablet (10 mEq total) by mouth 2 (two) times daily.   promethazine  (PHENERGAN ) 25 MG tablet Take 0.5 tablets (12.5 mg total) by mouth every 8 (eight) hours as needed for nausea. (Patient not taking: Reported on 12/29/2023)   promethazine -dextromethorphan (PROMETHAZINE -DM) 6.25-15 MG/5ML syrup Take 5 mLs by mouth 3 (three) times daily as needed for cough (cough). Caution of sedation (Patient not taking: Reported on 12/29/2023)   TART CHERRY PO Take 1 capsule by mouth 2 (two) times daily.   topiramate  (TOPAMAX ) 50 MG tablet Take 1 tablet (50 mg total) by mouth at bedtime.   venlafaxine  XR (EFFEXOR -XR) 150 MG 24 hr capsule TAKE 1 CAPSULE EVERY DAY WITH BREAKFAST   venlafaxine  XR (EFFEXOR -XR) 37.5 MG 24 hr capsule Take 1 capsule (37.5 mg total) by mouth daily with breakfast.   No facility-administered encounter medications on file as of 12/29/2023.    Allergies (verified) Fosamax [alendronate sodium], Ginzing [ginseng], Hydrocod  poli-chlorphe poli er, Hydrocodone -acetaminophen , Imitrex [sumatriptan], Oxycodone , Peanut-containing drug products, Tramadol , Hydrocodone -guaifenesin , Prednisone, and Tape   History: Past Medical History:  Diagnosis Date   AAA (abdominal aortic aneurysm) (HCC)    a. Chronic w/o evidence of aneurysmal dil on CTA 01/2016.   Alcohol abuse    Allergy    Anemia    Anxiety    Asthma    CAD (coronary artery disease)    a. 08/2010 s/p CABG x 1 (VG->RCA) @ time of Ao dissection repair; b. 01/2016 Lexiscan  MV: mid antsept/apical defect w/ ? peri-infarct ischemia-->likely attenuation-->Med Rx.   Chewing difficulty    Chronic combined systolic (congestive) and diastolic (congestive) heart failure (HCC)    a. 2012 EF 30-35%; b. 04/2015 EF 40-45%; c. 01/2016 Echo: Ef 55-65%, nl AoV bioprosthesis, nl RV, nl PASP.   Constipation  Depression    Edema of both lower extremities    Gallbladder sludge    GERD (gastroesophageal reflux disease)    Heart failure (HCC)    Heart valve problem    History of Bicuspid Aortic Valve    a. 08/2010 s/p AVR @ time of Ao dissection repair; b. 01/2016 Echo: Ef 55-65%, nl AoV bioprosthesis, nl RV, nl PASP.   Hx of repair of dissecting thoracic aortic aneurysm, Stanford type A    a. 08/2010 s/p repair with AVR and VG->RCA; b. 01/2016 CTA: stable appearance of Asc Thoracic Aortic graft. Opacification of flase lumen of chronic abd Ao dissection w/ retrograde flow through lumbar arteries. No aneurysmal dil of flase lumen.   Hyperlipidemia    Hypertension    Hypertensive heart disease    IBS (irritable bowel syndrome)    Internal hemorrhoids    Kidney problem    Marfan syndrome    pt denies   Migraine    Obstructive sleep apnea    Osteoarthritis    Osteoporosis    Palpitations    Pneumonia    RA (rheumatoid arthritis) (HCC)    a. Followed by Dr. Balinda   Past Surgical History:  Procedure Laterality Date   ABDOMINAL AORTIC ANEURYSM REPAIR     CARDIAC CATHETERIZATION   2001   CESAREAN SECTION  1986 & 1991   CHOLECYSTECTOMY     COLONOSCOPY     ESOPHAGOGASTRODUODENOSCOPY (EGD) WITH PROPOFOL  N/A 05/08/2020   Procedure: ESOPHAGOGASTRODUODENOSCOPY (EGD) WITH PROPOFOL ;  Surgeon: Janalyn Keene NOVAK, MD;  Location: [facility] ENDOSCOPY;  Service: Endoscopy;  Laterality: N/A;   FRACTURE SURGERY Right    shoulder replacement   HEMORRHOID BANDING     ORIF DISTAL RADIUS FRACTURE Left    UPPER GASTROINTESTINAL ENDOSCOPY     Family History  Problem Relation Age of Onset   Hypertension Mother    Depression Mother    Osteoporosis Mother    Stroke Mother    Irritable bowel syndrome Mother    Asthma Mother    Heart disease Mother    Thyroid  disease Mother    Anxiety disorder Mother    Other Father 72   Arthritis Sister    Diabetes Sister    Heart disease Sister    Asthma Sister    Migraines Sister    Diabetes Sister    Nephrolithiasis Sister    Osteoporosis Sister    Arthritis Sister    Asthma Sister    Migraines Sister    Stroke Maternal Grandmother    Colon cancer Maternal Grandmother    Lymphoma Maternal Grandmother    Migraines Maternal Grandmother    Lung cancer Maternal Grandfather    Melanoma Maternal Grandfather    Aneurysm Paternal Grandmother        brain   Diabetes Paternal Grandmother    Arthritis Paternal Grandmother    Arthritis Paternal Grandfather    Diabetes Paternal Grandfather    Diabetes Brother    Other Brother        prediabeties   Asthma Brother    Migraines Brother    Breast cancer Neg Hx    Social History   Socioeconomic History   Marital status: Married    Spouse name: [name]   Number of children: 2   Years of education: Not on file   Highest education level: Some college, no degree  Occupational History   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    Employer: [employer]  Tobacco Use   Smoking status: Former  Current packs/day: 0.00    Average packs/day: 0.8 packs/day for 1.5 years (1.1 ttl pk-yrs)    Types: Cigarettes     Start date: 06/20/1977    Quit date: 12/20/1978    Years since quitting: 45.0   Smokeless tobacco: Never  Vaping Use   Vaping status: Never Used  Substance and Sexual Activity   Alcohol use: Yes    Comment: 2-3 glasses of red wine per month   Drug use: No   Sexual activity: Never  Other Topics Concern   Not on file  Social History Narrative    married for the second time, happily   On disability after dissection of aortic aneurysm   Husband has Parkinson's - deteriorating w/ dementia   4-6 caffeinated beverages daily   01/25/2018         Social Drivers of Health   Financial Resource Strain: Low Risk  (12/29/2023)   Overall Financial Resource Strain (CARDIA)    Difficulty of Paying Living Expenses: Not hard at all  Food Insecurity: No Food Insecurity (12/29/2023)   Hunger Vital Sign    Worried About Running Out of Food in the Last Year: Never true    Ran Out of Food in the Last Year: Never true  Transportation Needs: No Transportation Needs (12/29/2023)   PRAPARE - Administrator, Civil Service (Medical): No    Lack of Transportation (Non-Medical): No  Physical Activity: Inactive (12/29/2023)   Exercise Vital Sign    Days of Exercise per Week: 0 days    Minutes of Exercise per Session: 0 min  Stress: Stress Concern Present (12/29/2023)   [employer] of Occupational Health - Occupational Stress Questionnaire    Feeling of Stress : Very much  Social Connections: Moderately Integrated (12/29/2023)   Social Connection and Isolation Panel [NHANES]    Frequency of Communication with Friends and Family: More than three times a week    Frequency of Social Gatherings with Friends and Family: Once a week    Attends Religious Services: More than 4 times per year    Active Member of [employer] or Organizations: No    Attends Engineer, Structural: Never    Marital Status: Married    Tobacco Counseling Counseling given: Not Answered  Clinical Intake:  Pre-visit  preparation completed: Yes  Pain : 0-10 Pain Score: 7  Pain Type: Acute pain Pain Location: Back Pain Orientation: Mid, Lower Pain Descriptors / Indicators: Aching Pain Onset: 1 to 4 weeks ago Pain Frequency: Constant Pain Relieving Factors: heat, rest it  Pain Relieving Factors: heat, rest it  BMI - recorded: 29.12 Nutritional Status: BMI 25 -29 Overweight Nutritional Risks: None Diabetes: No  How often do you need to have someone help you when you read instructions, pamphlets, or other written materials from your doctor or pharmacy?: 1 - Never  Interpreter Needed?: No  Comments: lives with husband Information entered by :: B.Julis Haubner,LPN   Activities of Daily Living    12/29/2023    8:28 AM  In your present state of health, do you have any difficulty performing the following activities:  Hearing? 0  Vision? 0  Difficulty concentrating or making decisions? 0  Walking or climbing stairs? 0  Dressing or bathing? 0  Doing errands, shopping? 0  Preparing Food and eating ? N  Using the Toilet? N  In the past six months, have you accidently leaked urine? Y  Do you have problems with loss of bowel control? N  Managing  your Medications? N  Managing your Finances? N  Housekeeping or managing your Housekeeping? N    Patient Care Team: Tower, Laine LABOR, MD as PCP - General (Family Medicine) Perla Evalene PARAS, MD as PCP - Cardiology (Cardiology) Cindie Ole DASEN, MD as PCP - Electrophysiology (Cardiology) Verdon Louann BIRCH, MD as Consulting Physician (Family Medicine)  Indicate any recent Medical Services you may have received from other than [facility] providers in the past year (date may be approximate).     Assessment:   This is a routine wellness examination for Makiya.  Hearing/Vision screen Hearing Screening - Comments:: Pt says her hearing is good Vision Screening - Comments:: Pt says she wears glasses and vision is good  [facility]   Goals Addressed              This Visit's Progress    COMPLETED: DIET - EAT MORE FRUITS AND VEGETABLES   On track    Patient Stated   On track    Starting 06/05/19, I will continue to take medications as prescribed.      Patient Stated   Not on track    Would like to maintain current weight.       Depression Screen    12/29/2023    8:24 AM 11/22/2023   12:06 PM 07/12/2023    9:59 AM 06/03/2023   11:59 AM 01/14/2023    3:17 PM 12/28/2022   11:10 AM 07/13/2022   11:19 AM  PHQ 2/9 Scores  PHQ - 2 Score 2 2 2 3 2 1 6   PHQ- 9 Score 6 6 8 12 8 3 19     Fall Risk    12/29/2023    8:18 AM 11/22/2023   12:06 PM 07/12/2023    9:59 AM 06/03/2023   11:59 AM 01/14/2023    3:17 PM  Fall Risk   Falls in the past year? 1 1 1 1 1   Number falls in past yr: 0 1 1 1 1   Injury with Fall? 1 1 1 1  0  Comment broke left foot      Risk for fall due to : No Fall Risks No Fall Risks History of fall(s) No Fall Risks History of fall(s)  Risk for fall due to: Comment foot drop      Follow up Education provided;Falls prevention discussed Falls evaluation completed Falls evaluation completed Falls evaluation completed Falls prevention discussed    MEDICARE RISK AT HOME: Medicare Risk at Home Any stairs in or around the home?: Yes (ramp) If so, are there any without handrails?: Yes Home free of loose throw rugs in walkways, pet beds, electrical cords, etc?: Yes Adequate lighting in your home to reduce risk of falls?: Yes Life alert?: No Use of a cane, walker or w/c?: No Grab bars in the bathroom?: Yes Shower chair or bench in shower?: Yes Elevated toilet seat or a handicapped toilet?: Yes  TIMED UP AND GO:  Was the test performed?  No    Cognitive Function:    06/05/2019    9:45 AM 12/07/2017    2:10 PM  MMSE - Mini Mental State Exam  Orientation to time 5 5  Orientation to Place 5 5  Registration 3 3  Attention/ Calculation 0 0  Recall 3 3  Language- name 2 objects 0 0  Language- repeat 1 1  Language- follow 3  step command 0 3  Language- read & follow direction 0 0  Write a sentence 0 0  Copy  design 0 0  Total score 17 20        12/29/2023    8:29 AM 12/28/2022   11:13 AM  6CIT Screen  What Year? 0 points 0 points  What month? 0 points 0 points  What time? 0 points 0 points  Count back from 20 0 points 0 points  Months in reverse 0 points 0 points  Repeat phrase 6 points 0 points  Total Score 6 points 0 points    Immunizations Immunization History  Administered Date(s) Administered   H1N1 01/09/2009   Influenza Split 09/20/2011, 09/11/2012   Influenza Whole 12/29/2006, 09/19/2008, 10/08/2010   Influenza, Seasonal, Injecte, Preservative Fre 10/08/2010   Influenza,inj,Quad PF,6+ Mos 10/08/2013, 08/13/2016, 10/27/2017, 10/05/2018, 09/17/2019   Influenza-Unspecified 09/26/2015, 09/15/2023   PFIZER Comirnaty(Gray Top)Covid-19 Tri-Sucrose Vaccine 05/15/2021   PFIZER(Purple Top)SARS-COV-2 Vaccination 03/12/2020, 04/02/2020, 10/10/2020   PNEUMOCOCCAL CONJUGATE-20 01/14/2023   Pneumococcal Polysaccharide-23 12/29/2006   Td 01/09/2009, 04/03/2019    TDAP status: Up to date  Flu Vaccine status: Up to date  Pneumococcal vaccine status: Up to date  Covid-19 vaccine status: Completed vaccines  Qualifies for Shingles Vaccine? Yes   Zostavax completed No   Shingrix Completed?: No.    Education has been provided regarding the importance of this vaccine. Patient has been advised to call insurance company to determine out of pocket expense if they have not yet received this vaccine. Advised may also receive vaccine at local pharmacy or Health Dept. Verbalized acceptance and understanding.  Screening Tests Health Maintenance  Topic Date Due   COVID-19 Vaccine (5 - 2024-25 season) 03/19/2024 (Originally 08/21/2023)   Zoster Vaccines- Shingrix (1 of 2) 03/28/2024 (Originally 10/17/1975)   Colonoscopy  12/28/2024 (Originally 01/19/2023)   MAMMOGRAM  08/17/2024   Medicare Annual Wellness (AWV)   12/28/2024   DTaP/Tdap/Td (3 - Tdap) 04/02/2029   Pneumonia Vaccine 12+ Years old  Completed   INFLUENZA VACCINE  Completed   DEXA SCAN  Completed   Hepatitis C Screening  Completed   HPV VACCINES  Aged Out    Health Maintenance  There are no preventive care reminders to display for this patient.   Colorectal cancer screening: Referral to GI placed yes. Pt aware the office will call re: appt.  Mammogram status: Completed 08/18/2023. Repeat every year  Bone Density status: Completed 08/18/2023. Results reflect: Bone density results: OSTEOPOROSIS. Repeat every 3-5 years.  Lung Cancer Screening: (Low Dose CT Chest recommended if Age 76-80 years, 20 pack-year currently smoking OR have quit w/in 15years.) does not qualify.   Lung Cancer Screening Referral: no  Additional Screening:  Hepatitis C Screening: does not qualify; Completed 11/24/2017  Vision Screening: Recommended annual ophthalmology exams for early detection of glaucoma and other disorders of the eye. Is the patient up to date with their annual eye exam?  Yes  Who is the provider or what is the name of the office in which the patient attends annual eye exams? [facility] If pt is not established with a provider, would they like to be referred to a provider to establish care? No .   Dental Screening: Recommended annual dental exams for proper oral hygiene  Diabetic Foot Exam: n/a  Community Resource Referral / Chronic Care Management: CRR required this visit?  No   CCM required this visit?  No    Plan:    I have personally reviewed and noted the following in the patient's chart:   Medical and social history Use of alcohol, tobacco or illicit drugs  Current medications and supplements including opioid prescriptions. Patient is not currently taking opioid prescriptions. Functional ability and status Nutritional status Physical activity Advanced directives List of other physicians Hospitalizations, surgeries, and ER  visits in previous 12 months Vitals Screenings to include cognitive, depression, and falls Referrals and appointments  In addition, I have reviewed and discussed with patient certain preventive protocols, quality metrics, and best practice recommendations. A written personalized care plan for preventive services as well as general preventive health recommendations were provided to patient.   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 She relays she is moving to [employer] for 2weeks while work done at her house. Pt has appt today with PCP for LBP.

## 2023-12-29 NOTE — Telephone Encounter (Signed)
 Gastroenterology Pre-Procedure Review  Request Date: 01/19/24 Requesting Physician: Dr. therisa  PATIENT REVIEW QUESTIONS: The patient responded to the following health history questions as indicated:    1. Are you having any GI issues? no 2. Do you have a personal history of Polyps? no 3. Do you have a family history of Colon Cancer or Polyps? yes (maternal grandmother colon cancer, colon polyps mother and sister) 4. Diabetes Mellitus? no 5. Joint replacements in the past 12 months?no 6. Major health problems in the past 3 months?no 7. Any artificial heart valves, MVP, or defibrillator?no  8. 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 Cardiac clearance w/ blood thinner advice sent to [facility] preop MEDICATIONS & ALLERGIES:    Patient reports the following regarding taking any anticoagulation/antiplatelet therapy:   Plavix, Coumadin , Eliquis , Xarelto, Lovenox , Pradaxa, Brilinta, or Effient? yes (eliquis ) Aspirin ? yes (81mg  daily)  Patient confirms/reports the following medications:  Current Outpatient Medications  Medication Sig Dispense Refill   acetaminophen  (TYLENOL ) 500 MG tablet Take 500 mg by mouth every 6 (six) hours as needed.     albuterol  (VENTOLIN  HFA) 108 (90 Base) MCG/ACT inhaler Inhale 1-2 puffs into the lungs every 6 (six) hours as needed for wheezing or shortness of breath. 3 each 3   ALPRAZolam  (XANAX ) 0.5 MG tablet Take 1 tablet (0.5 mg total) by mouth daily as needed for anxiety. 30 tablet 0   aspirin  EC 81 MG tablet Take 81 mg by mouth at bedtime.     atorvastatin  (LIPITOR) 20 MG tablet Take 20 mg by mouth. Four days per week     baclofen  (LIORESAL ) 10 MG tablet Take 1 tablet (10 mg total) by mouth daily. 30 tablet 1   benzonatate  (TESSALON ) 200 MG capsule Take 200 mg by mouth 3 (three) times daily as needed. (Patient not taking: Reported on 12/29/2023)     Calcium  Carbonate (CALCIUM  600 PO) Take 600 mg by mouth.     carvedilol  (COREG ) 12.5 MG tablet Take 1 tablet (12.5 mg  total) by mouth 2 (two) times daily. 180 tablet 3   cetirizine  (ZYRTEC ) 10 MG tablet Take 1 tablet (10 mg total) by mouth daily as needed for allergies. 90 tablet 3   Cholecalciferol (VITAMIN D3) 50 MCG (2000 UT) CAPS Take 2,000 capsules by mouth. FOUR TIMES A WEEK     cyclobenzaprine  (FLEXERIL ) 10 MG tablet Take 0.5-1 tablets (5-10 mg total) by mouth 3 (three) times daily as needed. For headache 90 tablet 3   dextromethorphan-guaiFENesin  (MUCINEX  DM) 30-600 MG 12hr tablet Take 1 tablet by mouth 2 (two) times daily.     doxycycline  (ADOXA) 100 MG tablet Take 100 mg by mouth 2 (two) times daily. (Patient not taking: Reported on 12/29/2023)     ELIQUIS  5 MG TABS tablet Take 1 tablet (5 mg total) by mouth 2 (two) times daily. 180 tablet 3   ezetimibe  (ZETIA ) 10 MG tablet TAKE 1 TABLET EVERY DAY 90 tablet 3   fluticasone  (FLONASE ) 50 MCG/ACT nasal spray Place 2 sprays into both nostrils daily as needed for allergies or rhinitis. 48 g 3   furosemide  (LASIX ) 20 MG tablet Take 2 tablets (40 mg total) by mouth daily. 180 tablet 3   hydrOXYzine (ATARAX) 25 MG tablet Take 25 mg by mouth 3 (three) times daily.     isosorbide  mononitrate (IMDUR ) 30 MG 24 hr tablet TAKE 1/2 TABLET EVERY DAY 45 tablet 3   lisinopril  (ZESTRIL ) 5 MG tablet Take 0.5 tablets (2.5 mg total) by mouth daily. 90  tablet 3   magnesium  gluconate (MAGONATE) 500 MG tablet Take 500 mg by mouth at bedtime.     Melatonin 5 MG SUBL Take 10 mg by mouth at bedtime as needed.     meloxicam  (MOBIC ) 15 MG tablet Take 1 tablet (15 mg total) by mouth daily. 30 tablet 0   methylPREDNISolone  (MEDROL  DOSEPAK) 4 MG TBPK tablet TAKE BY MOUTH AS DIRECTED (Patient not taking: Reported on 12/29/2023) 21 tablet 0   nitroGLYCERIN  (NITROSTAT ) 0.4 MG SL tablet Place 1 tablet (0.4 mg total) under the tongue every 5 (five) minutes as needed for chest pain. 25 tablet 3   omeprazole  (PRILOSEC) 40 MG capsule TAKE 1 CAPSULE EVERY DAY 90 capsule 1   polyethylene glycol  powder (GLYCOLAX /MIRALAX ) 17 GM/SCOOP powder Take 17 g by mouth as needed. 255 g 0   potassium chloride  (KLOR-CON  M) 10 MEQ tablet Take 1 tablet (10 mEq total) by mouth 2 (two) times daily. 180 tablet 3   promethazine  (PHENERGAN ) 25 MG tablet Take 0.5 tablets (12.5 mg total) by mouth every 8 (eight) hours as needed for nausea. (Patient not taking: Reported on 12/29/2023) 90 tablet 3   promethazine -dextromethorphan (PROMETHAZINE -DM) 6.25-15 MG/5ML syrup Take 5 mLs by mouth 3 (three) times daily as needed for cough (cough). Caution of sedation (Patient not taking: Reported on 12/29/2023) 118 mL 1   TART CHERRY PO Take 1 capsule by mouth 2 (two) times daily.     topiramate  (TOPAMAX ) 50 MG tablet Take 1 tablet (50 mg total) by mouth at bedtime. 30 tablet 1   venlafaxine  XR (EFFEXOR -XR) 150 MG 24 hr capsule TAKE 1 CAPSULE EVERY DAY WITH BREAKFAST 90 capsule 2   venlafaxine  XR (EFFEXOR -XR) 37.5 MG 24 hr capsule Take 1 capsule (37.5 mg total) by mouth daily with breakfast. 90 capsule 2   No current facility-administered medications for this visit.    Patient confirms/reports the following allergies:  Allergies  Allergen Reactions   Fosamax [Alendronate Sodium]     GI upset    Ginzing [Ginseng] Other (See Comments)    Hyper and jitery    Hydrocod Poli-Chlorphe Poli Er Itching   Hydrocodone -Acetaminophen  Itching   Imitrex [Sumatriptan]     Contraindicated w/ heart condition   Oxycodone  Itching   Peanut-Containing Drug Products Other (See Comments)    Reaction:  Migraines    Tramadol  Other (See Comments)    itching   Hydrocodone -Guaifenesin  Itching   Prednisone Rash   Tape Itching    No orders of the defined types were placed in this encounter.   AUTHORIZATION INFORMATION Primary Insurance: 1D#: Group #:  Secondary Insurance: 1D#: Group #:  SCHEDULE INFORMATION: Date: 01/19/24 Time: Location: [facility]

## 2023-12-29 NOTE — Patient Instructions (Addendum)
 Let's do depo medrol  40 mg IM now to see if it helps with your back   Gentle heat for 10 minutes at a time   Stop meloxicam - I do not like to mix it with your eliquis    Slow walking is best for your back   Start physical therapy as planned !   You can use whichever muscle relaxer you want if it helps and you are cautious of sedation

## 2023-12-29 NOTE — Patient Instructions (Signed)
 Ms. Bump , Thank you for taking time to come for your Medicare Wellness Visit. I appreciate your ongoing commitment to your health goals. Please review the following plan we discussed and let me know if I can assist you in the future.   Referrals/Orders/Follow-Ups/Clinician Recommendations:     This is a list of the screening recommended for you and due dates:  Health Maintenance  Topic Date Due   Zoster (Shingles) Vaccine (1 of 2) Never done   Colon Cancer Screening  01/19/2023   Flu Shot  07/21/2023   COVID-19 Vaccine (5 - 2024-25 season) 08/21/2023   Mammogram  08/17/2024   Medicare Annual Wellness Visit  12/28/2024   DTaP/Tdap/Td vaccine (3 - Tdap) 04/02/2029   Pneumonia Vaccine  Completed   DEXA scan (bone density measurement)  Completed   Hepatitis C Screening  Completed   HPV Vaccine  Aged Out    Advanced directives: (Copy Requested) Please bring a copy of your health care power of attorney and living will to the office to be added to your chart at your convenience.  Next Medicare Annual Wellness Visit scheduled for next year: Yes 01/01/2024 @ 8:10am televisit

## 2023-12-29 NOTE — Assessment & Plan Note (Addendum)
 In setting of lumbar deg disk disease /spondylosis  Reviewed last MRI  This flare is lasting longer than usual  Seen in ER on 12/30 -notes and plan reviewed  No help from muscle relaxers  Discouraged use of nsaids since taking eliquis  Discussed steroid use (cannot have prednisone), so depo medrol  40 IM given today Heat and walking advised Start PT as planned  Consider spine specialist if not improving   Call back and Er precautions noted in detail today

## 2023-12-29 NOTE — Progress Notes (Signed)
 Subjective:    Patient ID: Rachel Torres, female    DOB: Apr 12, 1956, 68 y.o.   MRN: [id number]  HPI  Wt Readings from Last 3 Encounters:  12/29/23 224 lb 2 oz (101.7 kg)  12/29/23 175 lb (79.4 kg)  12/21/23 175 lb (79.4 kg)   37.30 kg/m  Vitals:   12/29/23 1130  BP: 124/78  Pulse: 84  Temp: 98.6 F (37 C)  SpO2: 94%    Pt presents for ER follow up   Presented to ER on 1/1 for back pain  Mid back pain / spasms with movement   Saw neuro 12/30-prescription methocarbamol with no relief   Treated in ER Toradol  IM 15 mg  Baclofen  prescription   Did also follow up with her vasc surgeon for follow up of aortic dissection and no concerns   Back still hurts  Mid low/ left side worse than the right but both sides hurt  Does come around the front (aorta is fine)   Cough/sneeze makes it worse Getting up and down  Bending forward  Walking will sometimes help /sometimes will get spasms   Has never had back back surgery  Has seen Dr Malcolm in the past  No injections   Has PT planned (has had to cancel) for the left hip and shoulders  Has orders for back   No loss of B or B control  Has chronic issues lifting left foot (that is not new)  Pain is not radiating down very far   Is on meloxicam  (from ED) Is on eliquis  - ? Safe  Has not taken prednisone for back    MRI LS in sept 2024  Study Result CLINICAL DATA: Chronic low back pain.  EXAM: MRI LUMBAR SPINE WITHOUT CONTRAST  TECHNIQUE: Multiplanar, multisequence MR imaging of the lumbar spine was performed. No intravenous contrast was administered.  COMPARISON: 03/18/2015  FINDINGS: Segmentation: Standard.  Alignment: Physiologic.  Vertebrae: No acute fracture, evidence of discitis, or aggressive bone lesion. Mild chronic T12 vertebral body compression fracture with 40% height loss.  Conus medullaris and cauda equina: Conus extends to the L1-2 level. Conus and cauda equina appear  normal.  Paraspinal and other soft tissues: No acute paraspinal abnormality.  Disc levels:  Disc spaces: Degenerative disease with disc height loss at L1-2, L2-3 and L3-4. Disc desiccation at L4-5 and L5-S1.  T12-L1: No significant disc bulge. No neural foraminal stenosis. No central canal stenosis.  L1-L2: Broad-based disc bulge with a broad shallow central disc protrusion. No foraminal or central canal stenosis.  L2-L3: Mild broad-based disc bulge. No foraminal or central canal stenosis.  L3-L4: Mild broad-based disc bulge. Mild bilateral facet arthropathy. No foraminal or central canal stenosis.  L4-L5: No disc protrusion. Mild bilateral facet arthropathy. No foraminal or central canal stenosis.  L5-S1: No significant disc bulge. Mild bilateral facet arthropathy. No foraminal or central canal stenosis.  IMPRESSION: 1. Mild lumbar spine spondylosis as described above. 2. No acute osseous injury of the lumbar spine.   In middle of kitchen remodel  Moving to air [employer] for 2 weeks      Patient Active Problem List   Diagnosis Date Noted   Abnormal metabolism 11/14/2023   Weight regain after inital weight loss 11/14/2023   Multiple closed tarsal fractures of left foot, sequela 07/12/2023   Arm contusion, right, sequela 07/12/2023   Lumbar disc disease 07/12/2023   Atrial fibrillation (HCC) 06/09/2023   Paresthesia 06/03/2023   BMI 34.0-34.9,adult 02/17/2023  Obesity, Beginning BMI 35.45 02/17/2023   Tinnitus 01/14/2023   Change in vision 01/14/2023   Colon cancer screening 01/14/2023   Personal history of fall 01/14/2023   Problem related to unspecified psychosocial circumstances 12/28/2022   Person encountering health services to consult on behalf of another person 12/28/2022   Palpitation 12/10/2022   Fatigue 10/26/2022   B12 deficiency 10/26/2022   Right shoulder pain 09/15/2022   Depression 09/14/2022   Class 2 severe obesity with serious comorbidity  and body mass index (BMI) of 35.0 to 35.9 in adult [facility]) 09/14/2022   Other constipation 05/12/2022   Medicare annual wellness visit, initial 07/17/2021   TMJ (dislocation of temporomandibular joint) 07/17/2021   Encounter for screening mammogram for breast cancer 06/05/2021   Diarrhea 04/11/2020   Epigastric pain 02/19/2020   Dark stools 02/19/2020   History of atrial fibrillation 02/05/2019   Shortness of breath 01/27/2019   Irregular surface of cornea 12/25/2018   HPV (human papilloma virus) infection 12/01/2016   Screening mammogram, encounter for 11/29/2016   Estrogen deficiency 11/29/2016   Encounter for routine gynecological examination 11/29/2016   Grade III hemorrhoids 10/06/2016   Tachycardia 08/14/2016   Hemorrhoids 08/13/2016   Atypical migraine 08/03/2016   CAD (coronary artery disease)    AAA (abdominal aortic aneurysm) (HCC)    Chronic combined systolic (congestive) and diastolic (congestive) heart failure (HCC)    Prediabetes 02/11/2016   Generalized anxiety disorder 12/08/2015   Panic attacks 07/28/2015   Chest pain 07/09/2013   Sensorineural hearing loss 03/27/2013   Vitamin D  deficiency 07/03/2012   H/O aortic dissection 05/12/2012   Routine general medical examination at a health care facility 09/20/2011   Atypical chest pain 09/15/2011   S/P AVR (aortic valve replacement) 01/07/2011   CORONARY ARTERY BYPASS GRAFT, HX OF 01/07/2011   Arthritis 12/20/2010   Back pain 12/15/2010   H/O dissecting abdominal aortic aneurysm repair 08/24/2010   IRRITABLE BOWEL SYNDROME 09/09/2009   Obstructive sleep apnea 08/04/2009   External hemorrhoid 06/26/2009   Osteoporosis 01/09/2009   Hyperlipidemia 07/26/2007   Depression with anxiety 07/26/2007   Essential hypertension 07/26/2007   Allergic rhinitis 07/26/2007   Asthma 07/26/2007   GERD 07/26/2007   Osteoarthritis 07/26/2007   Ocular migraine 07/26/2007   Mild intermittent asthma without complication 07/26/2007    Past Medical History:  Diagnosis Date   AAA (abdominal aortic aneurysm) (HCC)    a. Chronic w/o evidence of aneurysmal dil on CTA 01/2016.   Alcohol abuse    Allergy    Anemia    Anxiety    Asthma    CAD (coronary artery disease)    a. 08/2010 s/p CABG x 1 (VG->RCA) @ time of Ao dissection repair; b. 01/2016 Lexiscan  MV: mid antsept/apical defect w/ ? peri-infarct ischemia-->likely attenuation-->Med Rx.   Chewing difficulty    Chronic combined systolic (congestive) and diastolic (congestive) heart failure (HCC)    a. 2012 EF 30-35%; b. 04/2015 EF 40-45%; c. 01/2016 Echo: Ef 55-65%, nl AoV bioprosthesis, nl RV, nl PASP.   Constipation    Depression    Edema of both lower extremities    Gallbladder sludge    GERD (gastroesophageal reflux disease)    Heart failure (HCC)    Heart valve problem    History of Bicuspid Aortic Valve    a. 08/2010 s/p AVR @ time of Ao dissection repair; b. 01/2016 Echo: Ef 55-65%, nl AoV bioprosthesis, nl RV, nl PASP.   Hx of repair of dissecting thoracic aortic  aneurysm, Stanford type A    a. 08/2010 s/p repair with AVR and VG->RCA; b. 01/2016 CTA: stable appearance of Asc Thoracic Aortic graft. Opacification of flase lumen of chronic abd Ao dissection w/ retrograde flow through lumbar arteries. No aneurysmal dil of flase lumen.   Hyperlipidemia    Hypertension    Hypertensive heart disease    IBS (irritable bowel syndrome)    Internal hemorrhoids    Kidney problem    Marfan syndrome    pt denies   Migraine    Obstructive sleep apnea    Osteoarthritis    Osteoporosis    Palpitations    Pneumonia    RA (rheumatoid arthritis) (HCC)    a. Followed by Dr. Balinda   Past Surgical History:  Procedure Laterality Date   ABDOMINAL AORTIC ANEURYSM REPAIR     CARDIAC CATHETERIZATION  2001   CESAREAN SECTION  1986 & 1991   CHOLECYSTECTOMY     COLONOSCOPY     ESOPHAGOGASTRODUODENOSCOPY (EGD) WITH PROPOFOL  N/A 05/08/2020   Procedure: ESOPHAGOGASTRODUODENOSCOPY  (EGD) WITH PROPOFOL ;  Surgeon: Janalyn Keene NOVAK, MD;  Location: [facility] ENDOSCOPY;  Service: Endoscopy;  Laterality: N/A;   FRACTURE SURGERY Right    shoulder replacement   HEMORRHOID BANDING     ORIF DISTAL RADIUS FRACTURE Left    UPPER GASTROINTESTINAL ENDOSCOPY     Social History   Tobacco Use   Smoking status: Former    Current packs/day: 0.00    Average packs/day: 0.8 packs/day for 1.5 years (1.1 ttl pk-yrs)    Types: Cigarettes    Start date: 06/20/1977    Quit date: 12/20/1978    Years since quitting: 45.0   Smokeless tobacco: Never  Vaping Use   Vaping status: Never Used  Substance Use Topics   Alcohol use: Yes    Comment: 2-3 glasses of red wine per month   Drug use: No   Family History  Problem Relation Age of Onset   Hypertension Mother    Depression Mother    Osteoporosis Mother    Stroke Mother    Irritable bowel syndrome Mother    Asthma Mother    Heart disease Mother    Thyroid  disease Mother    Anxiety disorder Mother    Other Father 43   Arthritis Sister    Diabetes Sister    Heart disease Sister    Asthma Sister    Migraines Sister    Diabetes Sister    Nephrolithiasis Sister    Osteoporosis Sister    Arthritis Sister    Asthma Sister    Migraines Sister    Stroke Maternal Grandmother    Colon cancer Maternal Grandmother    Lymphoma Maternal Grandmother    Migraines Maternal Grandmother    Lung cancer Maternal Grandfather    Melanoma Maternal Grandfather    Aneurysm Paternal Grandmother        brain   Diabetes Paternal Grandmother    Arthritis Paternal Grandmother    Arthritis Paternal Grandfather    Diabetes Paternal Grandfather    Diabetes Brother    Other Brother        prediabeties   Asthma Brother    Migraines Brother    Breast cancer Neg Hx    Allergies  Allergen Reactions   Alendronate Sodium Other (See Comments)    GI upset   Ginzing [Ginseng] Other (See Comments)    Hyper and jitery    Hydrocod Poli-Chlorphe Poli Er  Itching   Hydrocodone -Acetaminophen  Itching  Imitrex [Sumatriptan]     Contraindicated w/ heart condition   Oxycodone  Itching   Peanut-Containing Drug Products Other (See Comments)    Reaction:  Migraines    Tramadol  Other (See Comments)    itching   Hydrocodone -Guaifenesin  Itching   Iodine Itching    Patient reports erroneous entry.   Prednisone Rash   Tape Itching   Current Outpatient Medications on File Prior to Visit  Medication Sig Dispense Refill   acetaminophen  (TYLENOL ) 500 MG tablet Take 500 mg by mouth every 6 (six) hours as needed.     albuterol  (VENTOLIN  HFA) 108 (90 Base) MCG/ACT inhaler Inhale 1-2 puffs into the lungs every 6 (six) hours as needed for wheezing or shortness of breath. 3 each 3   ALPRAZolam  (XANAX ) 0.5 MG tablet Take 1 tablet (0.5 mg total) by mouth daily as needed for anxiety. 30 tablet 0   aspirin  EC 81 MG tablet Take 81 mg by mouth at bedtime.     atorvastatin  (LIPITOR) 20 MG tablet Take 20 mg by mouth. Four days per week     Calcium  Carbonate (CALCIUM  600 PO) Take 600 mg by mouth.     carvedilol  (COREG ) 12.5 MG tablet Take 1 tablet (12.5 mg total) by mouth 2 (two) times daily. 180 tablet 3   cetirizine  (ZYRTEC ) 10 MG tablet Take 1 tablet (10 mg total) by mouth daily as needed for allergies. 90 tablet 3   Cholecalciferol (VITAMIN D3) 50 MCG (2000 UT) CAPS Take 2,000 capsules by mouth. FOUR TIMES A WEEK     cyclobenzaprine  (FLEXERIL ) 10 MG tablet Take 0.5-1 tablets (5-10 mg total) by mouth 3 (three) times daily as needed. For headache 90 tablet 3   dextromethorphan-guaiFENesin  (MUCINEX  DM) 30-600 MG 12hr tablet Take 1 tablet by mouth 2 (two) times daily.     ELIQUIS  5 MG TABS tablet Take 1 tablet (5 mg total) by mouth 2 (two) times daily. 180 tablet 3   ezetimibe  (ZETIA ) 10 MG tablet TAKE 1 TABLET EVERY DAY 90 tablet 3   fluticasone  (FLONASE ) 50 MCG/ACT nasal spray Place 2 sprays into both nostrils daily as needed for allergies or rhinitis. 48 g 3    furosemide  (LASIX ) 20 MG tablet Take 2 tablets (40 mg total) by mouth daily. 180 tablet 3   isosorbide  mononitrate (IMDUR ) 30 MG 24 hr tablet TAKE 1/2 TABLET EVERY DAY 45 tablet 3   lisinopril  (ZESTRIL ) 5 MG tablet Take 0.5 tablets (2.5 mg total) by mouth daily. 90 tablet 3   magnesium  gluconate (MAGONATE) 500 MG tablet Take 500 mg by mouth at bedtime.     nitroGLYCERIN  (NITROSTAT ) 0.4 MG SL tablet Place 1 tablet (0.4 mg total) under the tongue every 5 (five) minutes as needed for chest pain. 25 tablet 3   omeprazole  (PRILOSEC) 40 MG capsule TAKE 1 CAPSULE EVERY DAY 90 capsule 1   polyethylene glycol powder (GLYCOLAX /MIRALAX ) 17 GM/SCOOP powder Take 17 g by mouth as needed. 255 g 0   potassium chloride  (KLOR-CON  M) 10 MEQ tablet Take 1 tablet (10 mEq total) by mouth 2 (two) times daily. 180 tablet 3   promethazine  (PHENERGAN ) 25 MG tablet Take 0.5 tablets (12.5 mg total) by mouth every 8 (eight) hours as needed for nausea. (Patient not taking: Reported on 12/29/2023) 90 tablet 3   TART CHERRY PO Take 1 capsule by mouth 2 (two) times daily.     topiramate  (TOPAMAX ) 50 MG tablet Take 1 tablet (50 mg total) by mouth at bedtime. 30 tablet  1   venlafaxine  XR (EFFEXOR -XR) 150 MG 24 hr capsule TAKE 1 CAPSULE EVERY DAY WITH BREAKFAST 90 capsule 2   venlafaxine  XR (EFFEXOR -XR) 37.5 MG 24 hr capsule Take 1 capsule (37.5 mg total) by mouth daily with breakfast. 90 capsule 2   No current facility-administered medications on file prior to visit.    Review of Systems  Constitutional:  Positive for fatigue. Negative for activity change, appetite change, fever and unexpected weight change.  HENT:  Negative for congestion, ear pain, rhinorrhea, sinus pressure and sore throat.   Eyes:  Negative for pain, redness and visual disturbance.  Respiratory:  Negative for cough, shortness of breath and wheezing.   Cardiovascular:  Negative for chest pain and palpitations.  Gastrointestinal:  Negative for abdominal pain,  blood in stool, constipation and diarrhea.  Endocrine: Negative for polydipsia and polyuria.  Genitourinary:  Negative for dysuria, frequency and urgency.  Musculoskeletal:  Positive for arthralgias and back pain. Negative for myalgias.  Skin:  Negative for pallor and rash.  Allergic/Immunologic: Negative for environmental allergies.  Neurological:  Negative for dizziness, syncope and headaches.  Hematological:  Negative for adenopathy. Does not bruise/bleed easily.  Psychiatric/Behavioral:  Negative for decreased concentration and dysphoric mood. The patient is not nervous/anxious.        Objective:   Physical Exam Constitutional:      General: She is not in acute distress.    Appearance: She is well-developed. She is obese. She is not ill-appearing or diaphoretic.  HENT:     Head: Normocephalic and atraumatic.     Mouth/Throat:     Mouth: Mucous membranes are moist.  Eyes:     General: No scleral icterus.    Conjunctiva/sclera: Conjunctivae normal.     Pupils: Pupils are equal, round, and reactive to light.  Cardiovascular:     Rate and Rhythm: Normal rate and regular rhythm.  Pulmonary:     Effort: Pulmonary effort is normal.     Breath sounds: Normal breath sounds. No wheezing or rales.  Abdominal:     General: Bowel sounds are normal. There is no distension.     Palpations: Abdomen is soft.     Tenderness: There is no abdominal tenderness.  Musculoskeletal:        General: Tenderness present.     Cervical back: Normal range of motion and neck supple.     Thoracic back: No swelling or deformity.     Lumbar back: Spasms and tenderness present. No edema or bony tenderness. Decreased range of motion.     Comments: Tender over upper LS and right lumbar musculature  Some loss of lordosis Neg SLR No neuro changes   Pain worse with flex and left lateral bend  No new foot drop  Spasms grab her when going to stand    Lymphadenopathy:     Cervical: No cervical adenopathy.   Skin:    General: Skin is warm and dry.     Coloration: Skin is not pale.     Findings: No erythema or rash.  Neurological:     Mental Status: She is alert.     Cranial Nerves: No cranial nerve deficit.     Sensory: No sensory deficit.     Motor: No atrophy or abnormal muscle tone.     Coordination: Coordination normal.     Deep Tendon Reflexes: Reflexes are normal and symmetric.     Comments: Negative SLR           Assessment &  Plan:   Problem List Items Addressed This Visit       Musculoskeletal and Integument   Lumbar disc disease   Currently worse/in a flare  Upper lumbar pain is worst - but no new sciatica   See a/p for back pain  Depo medrol  inj  PT  Call back and Er precautions noted in detail today          Other   Back pain - Primary   In setting of lumbar deg disk disease /spondylosis  Reviewed last MRI  This flare is lasting longer than usual  Seen in ER on 12/30 -notes and plan reviewed  No help from muscle relaxers  Discouraged use of nsaids since taking eliquis  Discussed steroid use (cannot have prednisone), so depo medrol  40 IM given today Heat and walking advised Start PT as planned  Consider spine specialist if not improving   Call back and Er precautions noted in detail today

## 2023-12-29 NOTE — Assessment & Plan Note (Signed)
 Currently worse/in a flare  Upper lumbar pain is worst - but no new sciatica   See a/p for back pain  Depo medrol inj  PT  Call back and Er precautions noted in detail today

## 2023-12-30 ENCOUNTER — Other Ambulatory Visit: Payer: Self-pay | Admitting: Family Medicine

## 2024-01-02 ENCOUNTER — Ambulatory Visit: Payer: Medicare HMO

## 2024-01-03 ENCOUNTER — Ambulatory Visit: Payer: Medicare HMO | Admitting: Psychology

## 2024-01-03 ENCOUNTER — Ambulatory Visit: Payer: Medicare HMO

## 2024-01-03 DIAGNOSIS — F331 Major depressive disorder, recurrent, moderate: Secondary | ICD-10-CM | POA: Diagnosis not present

## 2024-01-03 NOTE — Progress Notes (Signed)
  Behavioral Health Counselor/Therapist Progress Note  Patient ID: REIDA HEM, MRN: [id number],    Date: 01/03/2024  Time Spent: 10:00am-10:45am  45 minutes   Treatment Type: Individual Therapy  Reported Symptoms: stress  Mental Status Exam: Appearance:  Casual     Behavior: Appropriate  Motor: Normal  Speech/Language:  Normal Rate  Affect: Appropriate  Mood: normal  Thought process: normal  Thought content:   WNL  Sensory/Perceptual disturbances:   WNL  Orientation: oriented to person, place, time/date, and situation  Attention: Good  Concentration: Good  Memory: WNL  Fund of knowledge:  Good  Insight:   Good  Judgment:  Good  Impulse Control: Good   Risk Assessment: Danger to Self:  No Self-injurious Behavior: No Danger to Others: No Duty to Warn:no Physical Aggression / Violence:No  Access to Firearms a concern: No  Gang Involvement:No   Subjective: Pt present for face-to-face individual therapy via video.  Pt consents to telehealth video session and is aware of limitations and benefits of virtual sessions.  Location of pt: home Location of therapist: home office.  Pt talked about staying in an airbnb for a few weeks while the major renovations are occurring at her house.  She is just a couple of miles from her house.   Pt and tony are trying to get adjusted to the temporary living situation.   Pt talked about her health.  She is still in a lot of pain.  Pt states the pain is worse than it's been.  She has called her doctor and is trying to get an appointment.   Pt also has an upper respiratory virus.   Pt talked about her husband [name].     Pt states [name] has been increasingly more difficult to manage.   He has fallen again.  His memory is declining.   Pt is feeling very overwhelmed and disorganized.  Pt states she is feeling overwhelmed bc everyone depends on her.  Addressed how pt can set healthy boundaries with extended family.   Worked on self  care strategies.  Provided supportive therapy.  Interventions: Cognitive Behavioral Therapy and Insight-Oriented  Diagnosis:  F33.1  Plan of Care: Recommend ongoing therapy.   Pt participated in setting treatment goals.   Pt needs a place to talk about stressors.  Plan to meet every two weeks.    Treatment Plan (target date: 03/23/2024) Client Abilities/Strengths  Pt is bright, engaging, and motivated for therapy.   Client Treatment Preferences  Individual therapy.  Client Statement of Needs  Improve coping skills.  Have a place to talk about stressors.   Symptoms  Depressed or irritable mood. Unresolved grief issues.  Problems Addressed  Unipolar Depression Goals 1. Alleviate depressive symptoms and return to previous level of effective functioning. 2. Appropriately grieve the loss in order to normalize mood and to return to previously adaptive level of functioning. Objective Learn and implement behavioral strategies to overcome depression. Target Date: 2024-03-23 Frequency: Biweekly  Progress: 40 Modality: individual  Related Interventions Engage the client in behavioral activation, increasing his/her activity level and contact with sources of reward, while identifying processes that inhibit activation.  Use behavioral techniques such as instruction, rehearsal, role-playing, role reversal, as needed, to facilitate activity in the client's daily life; reinforce success. Assist the client in developing skills that increase the likelihood of deriving pleasure from behavioral activation (e.g., assertiveness skills, developing an exercise plan, less internal/more external focus, increased social involvement); reinforce success. Objective Identify important  people in life, past and present, and describe the quality, good and poor, of those relationships. Target Date: 2024-03-23 Frequency: Biweekly  Progress: 40 Modality: individual  Related Interventions Conduct Interpersonal Therapy  beginning with the assessment of the client's interpersonal inventory of important past and present relationships; develop a case formulation linking depression to grief, interpersonal role disputes, role transitions, and/or interpersonal deficits). Objective Learn and implement problem-solving and decision-making skills. Target Date: 2024-03-23 Frequency: Biweekly  Progress: 40 Modality: individual  Related Interventions Conduct Problem-Solving Therapy using techniques such as psychoeducation, modeling, and role-playing to teach client problem-solving skills (i.e., defining a problem specifically, generating possible solutions, evaluating the pros and cons of each solution, selecting and implementing a plan of action, evaluating the efficacy of the plan, accepting or revising the plan); role-play application of the problem-solving skill to a real life issue. Encourage in the client the development of a positive problem orientation in which problems and solving them are viewed as a natural part of life and not something to be feared, despaired, or avoided. 3. Develop healthy interpersonal relationships that lead to the alleviation and help prevent the relapse of depression. 4. Develop healthy thinking patterns and beliefs about self, others, and the world that lead to the alleviation and help prevent the relapse of depression. 5. Recognize, accept, and cope with feelings of depression. Diagnosis F33.1  Conditions For Discharge Achievement of treatment goals and objectives   Veva Alma, LCSW

## 2024-01-04 ENCOUNTER — Other Ambulatory Visit: Payer: Self-pay | Admitting: Cardiology

## 2024-01-05 ENCOUNTER — Telehealth: Payer: Self-pay

## 2024-01-05 NOTE — Telephone Encounter (Signed)
   Pre-operative Risk Assessment    Patient Name: SHAKEIMA BALASH  DOB: 05/02/1956 MRN: [id number]   Date of last office visit: 06/17/23 Date of next office visit: n/a   Request for Surgical Clearance    Procedure:   Colonoscopy   Date of Surgery:  Clearance 01/19/24                                 Surgeon:  Dr.Kiran Katy Apo Group or Practice Name:  [facility]  Phone number:  [phone] Fax number:  [phone]   Type of Clearance Requested:   - Medical  - Pharmacy:  Hold Apixaban (Eliquis) Not indicated    Type of Anesthesia:  General    Additional requests/questions:    Vance Peper   01/05/2024, 3:34 PM

## 2024-01-08 ENCOUNTER — Telehealth: Payer: Self-pay | Admitting: Family Medicine

## 2024-01-08 DIAGNOSIS — E559 Vitamin D deficiency, unspecified: Secondary | ICD-10-CM

## 2024-01-08 DIAGNOSIS — E538 Deficiency of other specified B group vitamins: Secondary | ICD-10-CM

## 2024-01-08 DIAGNOSIS — M810 Age-related osteoporosis without current pathological fracture: Secondary | ICD-10-CM

## 2024-01-08 DIAGNOSIS — E782 Mixed hyperlipidemia: Secondary | ICD-10-CM

## 2024-01-08 DIAGNOSIS — R7303 Prediabetes: Secondary | ICD-10-CM

## 2024-01-08 DIAGNOSIS — I10 Essential (primary) hypertension: Secondary | ICD-10-CM

## 2024-01-08 NOTE — Telephone Encounter (Signed)
-----   Message from Lovena Neighbours sent at 12/29/2023  2:35 PM EST ----- Regarding: Labs for Tuesday 1.22.25 Please put physical fasting lab orders in future. Thank you, Denny Peon

## 2024-01-09 ENCOUNTER — Telehealth: Payer: Self-pay

## 2024-01-09 ENCOUNTER — Ambulatory Visit: Payer: Medicare HMO | Admitting: Psychology

## 2024-01-09 DIAGNOSIS — F331 Major depressive disorder, recurrent, moderate: Secondary | ICD-10-CM

## 2024-01-09 DIAGNOSIS — G5731 Lesion of lateral popliteal nerve, right lower limb: Secondary | ICD-10-CM | POA: Diagnosis not present

## 2024-01-09 DIAGNOSIS — G5732 Lesion of lateral popliteal nerve, left lower limb: Secondary | ICD-10-CM | POA: Diagnosis not present

## 2024-01-09 NOTE — Telephone Encounter (Signed)
Called patient to set up an Tele appt on 01/16/24 for pre-op, Med rec and consent done.     Patient Consent for Virtual Visit        [name] has provided verbal consent on 01/09/2024 for a virtual visit (video or telephone).   CONSENT FOR VIRTUAL VISIT FOR:  Rachel Torres  By participating in this virtual visit I agree to the following:  I hereby voluntarily request, consent and authorize [facility] and its employed or contracted physicians, physician assistants, nurse practitioners or other licensed health care professionals (the Practitioner), to provide me with telemedicine health care services (the "Services") as deemed necessary by the treating Practitioner. I acknowledge and consent to receive the Services by the Practitioner via telemedicine. I understand that the telemedicine visit will involve communicating with the Practitioner through live audiovisual communication technology and the disclosure of certain medical information by electronic transmission. I acknowledge that I have been given the opportunity to request an in-person assessment or other available alternative prior to the telemedicine visit and am voluntarily participating in the telemedicine visit.  I understand that I have the right to withhold or withdraw my consent to the use of telemedicine in the course of my care at any time, without affecting my right to future care or treatment, and that the Practitioner or I may terminate the telemedicine visit at any time. I understand that I have the right to inspect all information obtained and/or recorded in the course of the telemedicine visit and may receive copies of available information for a reasonable fee.  I understand that some of the potential risks of receiving the Services via telemedicine include:  Delay or interruption in medical evaluation due to technological equipment failure or disruption; Information transmitted may not be sufficient  (e.g. poor resolution of images) to allow for appropriate medical decision making by the Practitioner; and/or  In rare instances, security protocols could fail, causing a breach of personal health information.  Furthermore, I acknowledge that it is my responsibility to provide information about my medical history, conditions and care that is complete and accurate to the best of my ability. I acknowledge that Practitioner's advice, recommendations, and/or decision may be based on factors not within their control, such as incomplete or inaccurate data provided by me or distortions of diagnostic images or specimens that may result from electronic transmissions. I understand that the practice of medicine is not an exact science and that Practitioner makes no warranties or guarantees regarding treatment outcomes. I acknowledge that a copy of this consent can be made available to me via my patient portal [facility] MyChart), or I can request a printed copy by calling the office of [facility].    I understand that my insurance will be billed for this visit.   I have read or had this consent read to me. I understand the contents of this consent, which adequately explains the benefits and risks of the Services being provided via telemedicine.  I have been provided ample opportunity to ask questions regarding this consent and the Services and have had my questions answered to my satisfaction. I give my informed consent for the services to be provided through the use of telemedicine in my medical care

## 2024-01-09 NOTE — Therapy (Signed)
OUTPATIENT PHYSICAL THERAPY TREATMENT   Patient Name: Rachel Torres MRN: [id number] DOB:Sep 12, 1956, 68 y.o., female Today's Date: 01/10/2024  END OF SESSION:  PT End of Session - 01/10/24 1138     Visit Number 17    Number of Visits 29    Date for PT Re-Evaluation 01/24/24    Authorization Type [employer]  Medicare HMO    Authorization Time Period 09/05/23-10/31/23    Progress Note Due on Visit 10    PT Start Time 1144    PT Stop Time 1229    PT Time Calculation (min) 45 min    Equipment Utilized During Treatment Gait belt    Activity Tolerance Patient tolerated treatment well    Behavior During Therapy WFL for tasks assessed/performed                        Past Medical History:  Diagnosis Date   AAA (abdominal aortic aneurysm) (HCC)    a. Chronic w/o evidence of aneurysmal dil on CTA 01/2016.   Alcohol abuse    Allergy    Anemia    Anxiety    Asthma    CAD (coronary artery disease)    a. 08/2010 s/p CABG x 1 (VG->RCA) @ time of Ao dissection repair; b. 01/2016 Lexiscan MV: mid antsept/apical defect w/ ? peri-infarct ischemia-->likely attenuation-->Med Rx.   Chewing difficulty    Chronic combined systolic (congestive) and diastolic (congestive) heart failure (HCC)    a. 2012 EF 30-35%; b. 04/2015 EF 40-45%; c. 01/2016 Echo: Ef 55-65%, nl AoV bioprosthesis, nl RV, nl PASP.   Constipation    Depression    Edema of both lower extremities    Gallbladder sludge    GERD (gastroesophageal reflux disease)    Heart failure (HCC)    Heart valve problem    History of Bicuspid Aortic Valve    a. 08/2010 s/p AVR @ time of Ao dissection repair; b. 01/2016 Echo: Ef 55-65%, nl AoV bioprosthesis, nl RV, nl PASP.   Hx of repair of dissecting thoracic aortic aneurysm, Stanford type A    a. 08/2010 s/p repair with AVR and VG->RCA; b. 01/2016 CTA: stable appearance of Asc Thoracic Aortic graft. Opacification of flase lumen of chronic abd Ao dissection w/ retrograde flow through  lumbar arteries. No aneurysmal dil of flase lumen.   Hyperlipidemia    Hypertension    Hypertensive heart disease    IBS (irritable bowel syndrome)    Internal hemorrhoids    Kidney problem    Marfan syndrome    pt denies   Migraine    Obstructive sleep apnea    Osteoarthritis    Osteoporosis    Palpitations    Pneumonia    RA (rheumatoid arthritis) (HCC)    a. Followed by Dr. Mallie Mussel   Past Surgical History:  Procedure Laterality Date   ABDOMINAL AORTIC ANEURYSM REPAIR     CARDIAC CATHETERIZATION  2001   CESAREAN SECTION  1986 & 1991   CHOLECYSTECTOMY     COLONOSCOPY     ESOPHAGOGASTRODUODENOSCOPY (EGD) WITH PROPOFOL N/A 05/08/2020   Procedure: ESOPHAGOGASTRODUODENOSCOPY (EGD) WITH PROPOFOL;  Surgeon: Pasty Spillers, MD;  Location: [facility] ENDOSCOPY;  Service: Endoscopy;  Laterality: N/A;   FRACTURE SURGERY Right    shoulder replacement   HEMORRHOID BANDING     ORIF DISTAL RADIUS FRACTURE Left    UPPER GASTROINTESTINAL ENDOSCOPY     Patient Active Problem List   Diagnosis Date Noted   Abnormal  metabolism 11/14/2023   Weight regain after inital weight loss 11/14/2023   Multiple closed tarsal fractures of left foot, sequela 07/12/2023   Arm contusion, right, sequela 07/12/2023   Lumbar disc disease 07/12/2023   Atrial fibrillation (HCC) 06/09/2023   Paresthesia 06/03/2023   BMI 34.0-34.9,adult 02/17/2023   Obesity, Beginning BMI 35.45 02/17/2023   Tinnitus 01/14/2023   Change in vision 01/14/2023   Colon cancer screening 01/14/2023   Personal history of fall 01/14/2023   Problem related to unspecified psychosocial circumstances 12/28/2022   Person encountering health services to consult on behalf of another person 12/28/2022   Palpitation 12/10/2022   Fatigue 10/26/2022   B12 deficiency 10/26/2022   Right shoulder pain 09/15/2022   Depression 09/14/2022   Class 2 severe obesity with serious comorbidity and body mass index (BMI) of 35.0 to 35.9 in adult [facility])  09/14/2022   Other constipation 05/12/2022   Medicare annual wellness visit, initial 07/17/2021   TMJ (dislocation of temporomandibular joint) 07/17/2021   Encounter for screening mammogram for breast cancer 06/05/2021   Diarrhea 04/11/2020   Epigastric pain 02/19/2020   Dark stools 02/19/2020   History of atrial fibrillation 02/05/2019   Shortness of breath 01/27/2019   Irregular surface of cornea 12/25/2018   HPV (human papilloma virus) infection 12/01/2016   Screening mammogram, encounter for 11/29/2016   Estrogen deficiency 11/29/2016   Encounter for routine gynecological examination 11/29/2016   Grade III hemorrhoids 10/06/2016   Tachycardia 08/14/2016   Hemorrhoids 08/13/2016   Atypical migraine 08/03/2016   CAD (coronary artery disease)    AAA (abdominal aortic aneurysm) (HCC)    Chronic combined systolic (congestive) and diastolic (congestive) heart failure (HCC)    Prediabetes 02/11/2016   Generalized anxiety disorder 12/08/2015   Panic attacks 07/28/2015   Chest pain 07/09/2013   Sensorineural hearing loss 03/27/2013   Vitamin D deficiency 07/03/2012   H/O aortic dissection 05/12/2012   Routine general medical examination at a health care facility 09/20/2011   Atypical chest pain 09/15/2011   S/P AVR (aortic valve replacement) 01/07/2011   CORONARY ARTERY BYPASS GRAFT, HX OF 01/07/2011   Arthritis 12/20/2010   Back pain 12/15/2010   H/O dissecting abdominal aortic aneurysm repair 08/24/2010   IRRITABLE BOWEL SYNDROME 09/09/2009   Obstructive sleep apnea 08/04/2009   External hemorrhoid 06/26/2009   Osteoporosis 01/09/2009   Hyperlipidemia 07/26/2007   Depression with anxiety 07/26/2007   Essential hypertension 07/26/2007   Allergic rhinitis 07/26/2007   Asthma 07/26/2007   GERD 07/26/2007   Osteoarthritis 07/26/2007   Ocular migraine 07/26/2007   Mild intermittent asthma without complication 07/26/2007    PCP: [name] MD   REFERRING PROVIDER:  Stormy Card  REFERRING DIAG: L rotator cuff   THERAPY DIAG:  Stiffness of left shoulder, not elsewhere classified  Other abnormalities of gait and mobility  Abnormal posture  Other low back pain  Left shoulder pain, unspecified chronicity  Chronic right shoulder pain  Rationale for Evaluation and Treatment: Rehabilitation  ONSET DATE: Feb 2024  SUBJECTIVE:  SUBJECTIVE STATEMENT:  Patient is retuning after four week absent. She has been to the ED twice since last session. Per patient she had a nerve conduction study that showed a peroneal nerve injury.   Hand dominance: Left  PERTINENT HISTORY: Rachel Torres is a 66yoF familiar to our services. Pt referred to PT for strengthening of chronic left shoulder DJD, known remote RC injury. Recently fell and broke four metatarsals, since cleared from use of CAM rocker. She was treated successfully in past for right shoulder pain and has past medical history of Right shoulder surgery replacement. 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 PMH aorta type A dissection in sept 2011 s/p aortic valve replacement, bypass of R coronary artery, anemia, anxiety, CAD, CHF, depression, GERD, Heart failure, HLD, HTN, IBS, migraine, osteoarthritis, osteoporosis, RA, back pain.    Back pain:  Patient has new referral for radiculopathy, lumbar region. MRI showed degeneration of L1-2 and L2-3 with some disc space collapse and minor retrolisthesis of L2-3. Issue began in February, felt numbness in L thigh. No cause known, but she was at a museum and noticed it was numb. Sometimes L foot numbness and toe numbness of 2nd and 3rd toe causing tripping. This caused her to trip July 4th and break five metatarsals. Some minor pain in the low pain noted. Pain noted to be 2-3/10 Pain  in hip 3-4/10 of L hip.    PAIN:  Are you having pain? Yes, 3/10 pain in lateral deltoids in the shoulder.  PRECAUTIONS: None  FALLS:  Has patient fallen in last 6 months? Yes. Number of falls 2  PATIENT GOALS:to lift more than 3lb with LUE, be able to eat a meal without arm being tired.    OBJECTIVE:  DIAGNOSTIC FINDINGS:  IMPRESSION: Mild degenerative change in the cervical spine without acute fracture or traumatic subluxation.   IMPRESSION: Full width, full width tears of the supraspinatus and infraspinatus tendons with retraction to the glenoid. Grade 2/3 infraspinatus atrophy.   Moderate tendinosis of the distal subscapularis tendon with articular sided fraying of the cephalad fibers.   Moderate intra-articular long head biceps tendinosis with fraying and possible partial tearing.   Mild glenohumeral and moderate AC joint osteoarthritis.   Degenerative superior labral tearing anteriorly and posteriorly.   PATIENT SURVEYS:  Quick Dash 50%  and FOTO 46%   COGNITION: Overall cognitive status: Within functional limits for tasks assessed                                  SENSATION: WFL   POSTURE: Slight rounded shoulders and shoulder elevation    UPPER EXTREMITY ROM:    Active ROM Right eval 9/16: recert RUE Left eval 9/16 RECERT LUE  Shoulder flexion 64 122 91 92  Shoulder extension        Shoulder abduction 95 88 52 82  Shoulder adduction        Shoulder internal rotation Unable to test at 90 61 degrees 5 40  Shoulder external rotation 18at side of body -5 degrees at 90 degrees 34 35  Elbow flexion [facility]  WFL   Elbow extension [facility]  WFL   Wrist flexion        Wrist extension        Wrist ulnar deviation        Wrist radial deviation        Wrist pronation  Wrist supination        (Blank rows = not tested)   UPPER EXTREMITY MMT:   MMT Right eval Left eval  Shoulder flexion      Shoulder extension      Shoulder abduction      Shoulder  adduction      Shoulder internal rotation      Shoulder external rotation      Middle trapezius      Lower trapezius      Elbow flexion      Elbow extension      Wrist flexion      Wrist extension      Wrist ulnar deviation      Wrist radial deviation      Wrist pronation      Wrist supination      Grip strength (lbs) 30 35  (Blank rows = not tested) Unable to test strength due to pain with motion   TRUNK ROM: Trunk Flexion 65  Trunk Extension 7  Trunk R SB Limited 25%  Trunk L SB WFL  Trunk R rotation WFL  Trunk L rotation Limited 25%    Ankle ROM Seated: Motion: AROM seated Right Left  Plantarflexion 4+ 3  Dorsiflexion 4+ 4  Inversion 4 3+  Eversion 4+ 3+    SHOULDER SPECIAL TESTS: Impingement tests:  unable to test due to pain in starting position SLAP lesions:  unable to test due to pain in starting position Instability tests:  unable to test due to pain in starting position Rotator cuff assessment:  unable to test due to pain in starting position Biceps assessment:  unable to test due to pain in starting position   LOW BACK SPECIAL TEST: SLR: negative  Scour test: negative FABER: negative Pelvic compression/distraction: negative  Slump test positive LLE  6 min walk test: 1160 ft increased left foot drag and antalgic gait pattern with foot eversion    JOINT MOBILITY TESTING:  AP: painful PA: painful Inferior: painful Distraction: reduces pain   Back mobilization hypomobile and painful to grade II mobilizations    PALPATION:  Tenderness to all joint regions of GH     TODAY'S TREATMENT: DATE: 01/10/24  Manual:  STM to thoracic paraspinals and intercostal musculature x 14 minutes Grade II mobilizations thoracic x 7 minutes  Therex: Seated: -posterior pelvic tilt 10x -thoracic rotation 10x  -modified open prayer seated (raise hands to ceiling) 10x  -TrA pushing into green swiss all 10x  -mini rollout swiss ball 10x -breathing 360 x multiple  reps   PATIENT EDUCATION: Education details: goals, POC  Person educated: Patient Education method: Explanation, Demonstration, Tactile cues, Verbal cues, and Handouts Education comprehension: verbalized understanding, returned demonstration, verbal cues required, and tactile cues required  HOME EXERCISE PROGRAM:  Access Code: [email or web address] URL: [email or web address] Date: 10/03/2023 Prepared by: Precious Bard  Exercises - Seated Scapular Retraction  - 1 x daily - 7 x weekly - 2 sets - 10 reps - 5 hold - Neck Sidebending  - 1 x daily - 7 x weekly - 2 sets - 2 reps - 30 hold - Supine Isometric Shoulder Extension with Towel  - 1 x daily - 7 x weekly - 2 sets - 10 reps - 5 hold - Supine Isometric Shoulder Adduction with Towel Roll  - 1 x daily - 7 x weekly - 2 sets - 10 reps - 5 hold - Seated Bilateral Shoulder Flexion Towel Slide at Table Top  - 1 x daily -  7 x weekly - 1 sets - 3 reps - 30 hold - Supine Posterior Pelvic Tilt  - 1 x daily - 7 x weekly - 2 sets - 5 reps - 30 hold - Supine LE Neural Mobilization  - 1 x daily - 7 x weekly - 2 sets - 10 reps - 5 hold - Seated Ankle Alphabet  - 1 x daily - 7 x weekly - 2 sets - 1 reps - 5 hold  ASSESSMENT:  CLINICAL IMPRESSION:  Patient returning from prolonged absence. Patient has significant muscle guarding and spasming of thoracic spine limiting all mobility. Gentle manual and therex tolerated well.   Pt will continue to benefit from skilled therapy to address remaining deficits in order to improve overall QoL and return to PLOF.      OBJECTIVE IMPAIRMENTS: cardiopulmonary status limiting activity, decreased activity tolerance, decreased mobility, increased fascial restrictions, impaired perceived functional ability, increased muscle spasms, impaired flexibility, impaired UE functional use, improper body mechanics, postural dysfunction, and pain.   ACTIVITY LIMITATIONS: carrying, lifting, bed mobility, bathing, dressing, self  feeding, reach over head, and caring for others  PARTICIPATION LIMITATIONS: meal prep, cleaning, laundry, personal finances, interpersonal relationship, driving, shopping, community activity, yard work, church, and caregiving for husband  PERSONAL FACTORS: Age, Fitness, Past/current experiences, Time since onset of injury/illness/exacerbation, and 3+ comorbidities: aorta type A dissection in sept 2011 with aortic valve replacement bypass of R coronary artery, AAA, anemia, anxiety, CAD, CHF, depression, GERD, Heart failure, HLD, HTN, IBS, Kidney problems, migraine, osteoarthritis, osteoporosis, RA, ocular migraines, back pain  are also affecting patient's functional outcome.   REHAB POTENTIAL: Fair    CLINICAL DECISION MAKING: Evolving/moderate complexity  EVALUATION COMPLEXITY: Moderate   GOALS: Goals reviewed with patient? Yes  SHORT TERM GOALS: Target date: 12/27/2023  Patient will be independent in home exercise program to improve strength/mobility for better functional independence with ADLs. Baseline:7/23: HEP given 9/16: compliant 1x/week  Goal status: On going     LONG TERM GOALS: Target date: 01/24/2024    Patient will increase FOTO score to equal to or greater than  57%   to demonstrate statistically significant improvement in mobility and quality of life.  Baseline: 7/22: 46 9/16: 50% 10/7: 40% 12/10: 57% Goal status: MET  2.  Patient will improve shoulder AROM to > 140 degrees of flexion, scaption, and abduction for improved ability to perform overhead activities. Baseline: 7/22: see above 9/16: see above 10/7 : defer to exacerbation 12/10: shoulder abduction >140, flexion < 140 , assess scaption next visit  Goal status: Partially Met   3.  Patient will demonstrate adequate shoulder ROM and strength to be able to shave and dress independently with pain less than 3/10. Baseline: 7/23: 6/10 pain with dressing, limited ROM, unable to test strength 9/16: 3/10 pain with  dressing  Goal status: MET  4.  Patient will decrease Quick DASH score by > 8 points demonstrating reduced self-reported upper extremity disability for L and R shoulder  Baseline: 7/23: L shoulder 50% 9/16: R=54.54 % L 66% L 81% R 24% 12/10: R: 43.2% L: 56.8%  Goal status: IN PROGRESS  5.  Patient will be able to eat an entire meal without resting arm demonstrating improved functional strength Baseline: 7/23: has to rest while eating due to pain. 9/16: able to make halfway through meal 10/7: can make it about halfway through meal, some meals can get 3/4s through meal 12/10: improved, still has to rest every once in a while  Goal status: Partially Met  6.  Patient will be able to stand 15 minutes without her LLE going numb allowing for ADLS such as showering and iADLs such as cooking Baseline: 5 minutes 12/10: will assess next visit  Goal status: IN PROGRESS  7. Patient will increase six minute walk test distance to >1500 for progression to community ambulator and improve gait ability Baseline: 1160 ft  12/10: 1010 ft  Goal status: IN PROGRESS  PLAN:  PT FREQUENCY: 2x/week  PT DURATION: 8 weeks  PLANNED INTERVENTIONS: Therapeutic exercises, Therapeutic activity, Neuromuscular re-education, Balance training, Gait training, Patient/Family education, Self Care, Joint mobilization, Vestibular training, Canalith repositioning, Visual/preceptual remediation/compensation, Dry Needling, Electrical stimulation, Spinal mobilization, Cryotherapy, Moist heat, Compression bandaging, scar mobilization, Splintting, Taping, Traction, Ultrasound, Biofeedback, Ionotophoresis 4mg /ml Dexamethasone, Manual therapy, and Re-evaluation  PLAN FOR NEXT SESSION:   Continue with LUE strengthening  L ankle strengthening, distraction, core activation, reassess scaption and flexion ROM, make HEP     Precious Bard PT  Physical Therapist - [facility]  [facility]  01/10/24, 12:34 PM

## 2024-01-09 NOTE — Telephone Encounter (Signed)
Patient with diagnosis of afib on Eliquis for anticoagulation.    Procedure: colonoscopy Date of procedure: 01/19/24   CHA2DS2-VASc Score = 5   This indicates a 7.2% annual risk of stroke. The patient's score is based upon: CHF History: 1 HTN History: 1 Diabetes History: 0 Stroke History: 0 Vascular Disease History: 1 Age Score: 1 Gender Score: 1      CrCl 81.7 ml/min Platelet count 221  Per office protocol, patient can hold Eliquis for 2 days prior to procedure.    **This guidance is not considered finalized until pre-operative APP has relayed final recommendations.**

## 2024-01-09 NOTE — Progress Notes (Signed)
[facility] Counselor/Therapist Progress Note  Patient ID: Rachel Torres, MRN: [id number],    Date: 01/09/2024  Time Spent: 10:00am-10:55am  55 minutes   Treatment Type: Individual Therapy  Reported Symptoms: stress  Mental Status Exam: Appearance:  Casual     Behavior: Appropriate  Motor: Normal  Speech/Language:  Normal Rate  Affect: Appropriate  Mood: normal  Thought process: normal  Thought content:   WNL  Sensory/Perceptual disturbances:   WNL  Orientation: oriented to person, place, time/date, and situation  Attention: Good  Concentration: Good  Memory: WNL  Fund of knowledge:  Good  Insight:   Good  Judgment:  Good  Impulse Control: Good   Risk Assessment: Danger to Self:  No Self-injurious Behavior: No Danger to Others: No Duty to Warn:no Physical Aggression / Violence:No  Access to Firearms a concern: No  Gang Involvement:No   Subjective: Pt present for face-to-face individual therapy via video.  Pt consents to telehealth video session and is aware of limitations and benefits of virtual sessions.  Location of pt: home Location of therapist: home office.  Pt talked about her health.  She is still recovering from an upper respiratory virus and her back is feeling a little better.   Pt talked about her home renovations.   They are making progress and pt and [name] are still staying at an airbnb.   Pt talked about her uncle in [place] passing away recently.  Pt feels sad.  Helped pt process her feelings and grief.  [name] sister has cancer and her brother in law was just diagnosed with melanoma.   Pt talked about [name].     Pt states [name] has been increasingly more difficult to manage.   He has fallen several times.  He was not hurt.  His memory is declining.   Pt is feeling very overwhelmed and disorganized.  Pt is working on getting a new caregiving program set up.   Pt states she is feeling overwhelmed bc everyone depends on her.   Addressed how pt can set healthy boundaries with extended family.   Worked on self care strategies.  Provided supportive therapy.  Interventions: Cognitive Behavioral Therapy and Insight-Oriented  Diagnosis:  F33.1  Plan of Care: Recommend ongoing therapy.   Pt participated in setting treatment goals.   Pt needs a place to talk about stressors.  Plan to meet every two weeks.    Treatment Plan (target date: 03/23/2024) Client Abilities/Strengths  Pt is bright, engaging, and motivated for therapy.   Client Treatment Preferences  Individual therapy.  Client Statement of Needs  Improve coping skills.  Have a place to talk about stressors.   Symptoms  Depressed or irritable mood. Unresolved grief issues.  Problems Addressed  Unipolar Depression Goals 1. Alleviate depressive symptoms and return to previous level of effective functioning. 2. Appropriately grieve the loss in order to normalize mood and to return to previously adaptive level of functioning. Objective Learn and implement behavioral strategies to overcome depression. Target Date: 2024-03-23 Frequency: Biweekly  Progress: 40 Modality: individual  Related Interventions Engage the client in "behavioral activation," increasing his/her activity level and contact with sources of reward, while identifying processes that inhibit activation.  Use behavioral techniques such as instruction, rehearsal, role-playing, role reversal, as needed, to facilitate activity in the client's daily life; reinforce success. Assist the client in developing skills that increase the likelihood of deriving pleasure from behavioral activation (e.g., assertiveness skills, developing an exercise plan, less  internal/more external focus, increased social involvement); reinforce success. Objective Identify important people in life, past and present, and describe the quality, good and poor, of those relationships. Target Date: 2024-03-23 Frequency: Biweekly   Progress: 40 Modality: individual  Related Interventions Conduct Interpersonal Therapy beginning with the assessment of the client's "interpersonal inventory" of important past and present relationships; develop a case formulation linking depression to grief, interpersonal role disputes, role transitions, and/or interpersonal deficits). Objective Learn and implement problem-solving and decision-making skills. Target Date: 2024-03-23 Frequency: Biweekly  Progress: 40 Modality: individual  Related Interventions Conduct Problem-Solving Therapy using techniques such as psychoeducation, modeling, and role-playing to teach client problem-solving skills (i.e., defining a problem specifically, generating possible solutions, evaluating the pros and cons of each solution, selecting and implementing a plan of action, evaluating the efficacy of the plan, accepting or revising the plan); role-play application of the problem-solving skill to a real life issue. Encourage in the client the development of a positive problem orientation in which problems and solving them are viewed as a natural part of life and not something to be feared, despaired, or avoided. 3. Develop healthy interpersonal relationships that lead to the alleviation and help prevent the relapse of depression. 4. Develop healthy thinking patterns and beliefs about self, others, and the world that lead to the alleviation and help prevent the relapse of depression. 5. Recognize, accept, and cope with feelings of depression. Diagnosis F33.1  Conditions For Discharge Achievement of treatment goals and objectives   Salomon Fick, LCSW

## 2024-01-09 NOTE — Telephone Encounter (Signed)
Called patient to set up an Tele appt on 01/16/24 for pre-op, Med rec and consent done.

## 2024-01-09 NOTE — Telephone Encounter (Signed)
   Name: Rachel Torres  DOB: October 13, 1956  MRN: [id number]  Primary Cardiologist: Julien Nordmann, MD   Preoperative team, please contact this patient and set up a phone call appointment for further preoperative risk assessment. Please obtain consent and complete medication review. Thank you for your help.  I confirm that guidance regarding antiplatelet and oral anticoagulation therapy has been completed and, if necessary, noted below.  Per office protocol, patient can hold Eliquis for 2 days prior to procedure.   I also confirmed the patient resides in the state of [place]. As per [facility] Medical Board telemedicine laws, the patient must reside in the state in which the provider is licensed.   Denyce Robert, NP 01/09/2024, 4:23 PM [facility]

## 2024-01-10 ENCOUNTER — Ambulatory Visit: Payer: Medicare HMO | Admitting: Psychology

## 2024-01-10 ENCOUNTER — Ambulatory Visit: Payer: Medicare HMO | Attending: Orthopedic Surgery

## 2024-01-10 DIAGNOSIS — M25511 Pain in right shoulder: Secondary | ICD-10-CM | POA: Diagnosis not present

## 2024-01-10 DIAGNOSIS — M5459 Other low back pain: Secondary | ICD-10-CM | POA: Diagnosis not present

## 2024-01-10 DIAGNOSIS — R293 Abnormal posture: Secondary | ICD-10-CM

## 2024-01-10 DIAGNOSIS — M25512 Pain in left shoulder: Secondary | ICD-10-CM | POA: Diagnosis not present

## 2024-01-10 DIAGNOSIS — M25612 Stiffness of left shoulder, not elsewhere classified: Secondary | ICD-10-CM | POA: Diagnosis not present

## 2024-01-10 DIAGNOSIS — R2689 Other abnormalities of gait and mobility: Secondary | ICD-10-CM

## 2024-01-10 DIAGNOSIS — G8929 Other chronic pain: Secondary | ICD-10-CM | POA: Diagnosis not present

## 2024-01-11 ENCOUNTER — Other Ambulatory Visit: Payer: Medicare HMO

## 2024-01-12 ENCOUNTER — Ambulatory Visit: Payer: Medicare HMO

## 2024-01-13 NOTE — Telephone Encounter (Signed)
Reminder Phone call made to patient.  LVM advising her to stop Eliquis 2 days prior to colonoscopy as recommended by Dr. [name].  Thanks, [place], [place]  Name: Rachel Torres  DOB: 04/29/1956  MRN: [id number]   Primary Cardiologist: Julien Nordmann, MD     Preoperative team, please contact this patient and set up a phone call appointment for further preoperative risk assessment. Please obtain consent and complete medication review. Thank you for your help.   I confirm that guidance regarding antiplatelet and oral anticoagulation therapy has been completed and, if necessary, noted below.   Per office protocol, patient can hold Eliquis for 2 days prior to procedure.    I also confirmed the patient resides in the state of [place]. As per [facility] Medical Board telemedicine laws, the patient must reside in the state in which the provider is licensed.     Denyce Robert, NP 01/09/2024, 4:23 PM [facility]

## 2024-01-16 ENCOUNTER — Other Ambulatory Visit: Payer: Medicare HMO

## 2024-01-16 ENCOUNTER — Ambulatory Visit: Payer: Medicare HMO | Attending: Physician Assistant | Admitting: Physician Assistant

## 2024-01-16 ENCOUNTER — Encounter: Payer: Self-pay | Admitting: Physician Assistant

## 2024-01-16 DIAGNOSIS — Z01810 Encounter for preprocedural cardiovascular examination: Secondary | ICD-10-CM

## 2024-01-16 NOTE — Therapy (Signed)
OUTPATIENT PHYSICAL THERAPY TREATMENT   Patient Name: Rachel Torres MRN: [id number] DOB:[birth year], 68 y.o., female Today's Date: 01/17/2024  END OF SESSION:  PT End of Session - 01/17/24 1141     Visit Number 18    Number of Visits 29    Date for PT Re-Evaluation 01/24/24    Authorization Type [employer]  Medicare HMO    Authorization Time Period 09/05/23-10/31/23    Progress Note Due on Visit 10    PT Start Time 1144    PT Stop Time 1229    PT Time Calculation (min) 45 min    Equipment Utilized During Treatment Gait belt    Activity Tolerance Patient tolerated treatment well    Behavior During Therapy WFL for tasks assessed/performed                         Past Medical History:  Diagnosis Date   AAA (abdominal aortic aneurysm) (HCC)    a. Chronic w/o evidence of aneurysmal dil on CTA 01/2016.   Alcohol abuse    Allergy    Anemia    Anxiety    Asthma    CAD (coronary artery disease)    a. 08/2010 s/p CABG x 1 (VG->RCA) @ time of Ao dissection repair; b. 01/2016 Lexiscan MV: mid antsept/apical defect w/ ? peri-infarct ischemia-->likely attenuation-->Med Rx.   Chewing difficulty    Chronic combined systolic (congestive) and diastolic (congestive) heart failure (HCC)    a. 2012 EF 30-35%; b. 04/2015 EF 40-45%; c. 01/2016 Echo: Ef 55-65%, nl AoV bioprosthesis, nl RV, nl PASP.   Constipation    Depression    Edema of both lower extremities    Gallbladder sludge    GERD (gastroesophageal reflux disease)    Heart failure (HCC)    Heart valve problem    History of Bicuspid Aortic Valve    a. 08/2010 s/p AVR @ time of Ao dissection repair; b. 01/2016 Echo: Ef 55-65%, nl AoV bioprosthesis, nl RV, nl PASP.   Hx of repair of dissecting thoracic aortic aneurysm, Stanford type A    a. 08/2010 s/p repair with AVR and VG->RCA; b. 01/2016 CTA: stable appearance of Asc Thoracic Aortic graft. Opacification of flase lumen of chronic abd Ao dissection w/ retrograde flow through  lumbar arteries. No aneurysmal dil of flase lumen.   Hyperlipidemia    Hypertension    Hypertensive heart disease    IBS (irritable bowel syndrome)    Internal hemorrhoids    Kidney problem    Marfan syndrome    pt denies   Migraine    Obstructive sleep apnea    Osteoarthritis    Osteoporosis    Palpitations    Pneumonia    RA (rheumatoid arthritis) (HCC)    a. Followed by Dr. Mallie Mussel   Past Surgical History:  Procedure Laterality Date   ABDOMINAL AORTIC ANEURYSM REPAIR     CARDIAC CATHETERIZATION  2001   CESAREAN SECTION  1986 & 1991   CHOLECYSTECTOMY     COLONOSCOPY     ESOPHAGOGASTRODUODENOSCOPY (EGD) WITH PROPOFOL N/A 05/08/2020   Procedure: ESOPHAGOGASTRODUODENOSCOPY (EGD) WITH PROPOFOL;  Surgeon: Pasty Spillers, MD;  Location: [facility] ENDOSCOPY;  Service: Endoscopy;  Laterality: N/A;   FRACTURE SURGERY Right    shoulder replacement   HEMORRHOID BANDING     ORIF DISTAL RADIUS FRACTURE Left    UPPER GASTROINTESTINAL ENDOSCOPY     Patient Active Problem List   Diagnosis Date Noted  Abnormal metabolism 11/14/2023   Weight regain after inital weight loss 11/14/2023   Multiple closed tarsal fractures of left foot, sequela 07/12/2023   Arm contusion, right, sequela 07/12/2023   Lumbar disc disease 07/12/2023   Atrial fibrillation (HCC) 06/09/2023   Paresthesia 06/03/2023   BMI 34.0-34.9,adult 02/17/2023   Obesity, Beginning BMI 35.45 02/17/2023   Tinnitus 01/14/2023   Change in vision 01/14/2023   Colon cancer screening 01/14/2023   Personal history of fall 01/14/2023   Problem related to unspecified psychosocial circumstances 12/28/2022   Person encountering health services to consult on behalf of another person 12/28/2022   Palpitation 12/10/2022   Fatigue 10/26/2022   B12 deficiency 10/26/2022   Right shoulder pain 09/15/2022   Depression 09/14/2022   Class 2 severe obesity with serious comorbidity and body mass index (BMI) of 35.0 to 35.9 in adult [facility])  09/14/2022   Other constipation 05/12/2022   Medicare annual wellness visit, initial 07/17/2021   TMJ (dislocation of temporomandibular joint) 07/17/2021   Encounter for screening mammogram for breast cancer 06/05/2021   Diarrhea 04/11/2020   Epigastric pain 02/19/2020   Dark stools 02/19/2020   History of atrial fibrillation 02/05/2019   Shortness of breath 01/27/2019   Irregular surface of cornea 12/25/2018   HPV (human papilloma virus) infection 12/01/2016   Screening mammogram, encounter for 11/29/2016   Estrogen deficiency 11/29/2016   Encounter for routine gynecological examination 11/29/2016   Grade III hemorrhoids 10/06/2016   Tachycardia 08/14/2016   Hemorrhoids 08/13/2016   Atypical migraine 08/03/2016   CAD (coronary artery disease)    AAA (abdominal aortic aneurysm) (HCC)    Chronic combined systolic (congestive) and diastolic (congestive) heart failure (HCC)    Prediabetes 02/11/2016   Generalized anxiety disorder 12/08/2015   Panic attacks 07/28/2015   Chest pain 07/09/2013   Sensorineural hearing loss 03/27/2013   Vitamin D deficiency 07/03/2012   H/O aortic dissection 05/12/2012   Routine general medical examination at a health care facility 09/20/2011   Atypical chest pain 09/15/2011   S/P AVR (aortic valve replacement) 01/07/2011   CORONARY ARTERY BYPASS GRAFT, HX OF 01/07/2011   Arthritis 12/20/2010   Back pain 12/15/2010   H/O dissecting abdominal aortic aneurysm repair 08/24/2010   IRRITABLE BOWEL SYNDROME 09/09/2009   Obstructive sleep apnea 08/04/2009   External hemorrhoid 06/26/2009   Osteoporosis 01/09/2009   Hyperlipidemia 07/26/2007   Depression with anxiety 07/26/2007   Essential hypertension 07/26/2007   Allergic rhinitis 07/26/2007   Asthma 07/26/2007   GERD 07/26/2007   Osteoarthritis 07/26/2007   Ocular migraine 07/26/2007   Mild intermittent asthma without complication 07/26/2007    PCP: [name] MD   REFERRING PROVIDER:  Stormy Card  REFERRING DIAG: L rotator cuff   THERAPY DIAG:  Stiffness of left shoulder, not elsewhere classified  Other abnormalities of gait and mobility  Abnormal posture  Other low back pain  Rationale for Evaluation and Treatment: Rehabilitation  ONSET DATE: Feb 2024  SUBJECTIVE:  SUBJECTIVE STATEMENT: Patient's husband fell on her and pulled her down over the weekend.   Hand dominance: Left  PERTINENT HISTORY: Rachel Torres is a 66yoF familiar to our services. Pt referred to PT for strengthening of chronic left shoulder DJD, known remote RC injury. Recently fell and broke four metatarsals, since cleared from use of CAM rocker. She was treated successfully in past for right shoulder pain and has past medical history of Right shoulder surgery replacement. 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 PMH aorta type A dissection in sept 2011 s/p aortic valve replacement, bypass of R coronary artery, anemia, anxiety, CAD, CHF, depression, GERD, Heart failure, HLD, HTN, IBS, migraine, osteoarthritis, osteoporosis, RA, back pain.    Back pain:  Patient has new referral for radiculopathy, lumbar region. MRI showed degeneration of L1-2 and L2-3 with some disc space collapse and minor retrolisthesis of L2-3. Issue began in February, felt numbness in L thigh. No cause known, but she was at a museum and noticed it was numb. Sometimes L foot numbness and toe numbness of 2nd and 3rd toe causing tripping. This caused her to trip July 4th and break five metatarsals. Some minor pain in the low pain noted. Pain noted to be 2-3/10 Pain in hip 3-4/10 of L hip.    PAIN:  Are you having pain? Yes, 3/10 pain in lateral deltoids in the shoulder.  PRECAUTIONS: None  FALLS:  Has patient fallen in last 6 months? Yes.  Number of falls 2  PATIENT GOALS:to lift more than 3lb with LUE, be able to eat a meal without arm being tired.    OBJECTIVE:  DIAGNOSTIC FINDINGS:  IMPRESSION: Mild degenerative change in the cervical spine without acute fracture or traumatic subluxation.   IMPRESSION: Full width, full width tears of the supraspinatus and infraspinatus tendons with retraction to the glenoid. Grade 2/3 infraspinatus atrophy.   Moderate tendinosis of the distal subscapularis tendon with articular sided fraying of the cephalad fibers.   Moderate intra-articular long head biceps tendinosis with fraying and possible partial tearing.   Mild glenohumeral and moderate AC joint osteoarthritis.   Degenerative superior labral tearing anteriorly and posteriorly.   PATIENT SURVEYS:  Quick Dash 50%  and FOTO 46%   COGNITION: Overall cognitive status: Within functional limits for tasks assessed                                  SENSATION: WFL   POSTURE: Slight rounded shoulders and shoulder elevation    UPPER EXTREMITY ROM:    Active ROM Right eval 9/16: recert RUE Left eval 9/16 RECERT LUE  Shoulder flexion 64 122 91 92  Shoulder extension        Shoulder abduction 95 88 52 82  Shoulder adduction        Shoulder internal rotation Unable to test at 90 61 degrees 5 40  Shoulder external rotation 18at side of body -5 degrees at 90 degrees 34 35  Elbow flexion [facility]  WFL   Elbow extension [facility]  WFL   Wrist flexion        Wrist extension        Wrist ulnar deviation        Wrist radial deviation        Wrist pronation        Wrist supination        (Blank rows = not tested)   UPPER EXTREMITY MMT:  MMT Right eval Left eval  Shoulder flexion      Shoulder extension      Shoulder abduction      Shoulder adduction      Shoulder internal rotation      Shoulder external rotation      Middle trapezius      Lower trapezius      Elbow flexion      Elbow extension      Wrist flexion       Wrist extension      Wrist ulnar deviation      Wrist radial deviation      Wrist pronation      Wrist supination      Grip strength (lbs) 30 35  (Blank rows = not tested) Unable to test strength due to pain with motion   TRUNK ROM: Trunk Flexion 65  Trunk Extension 7  Trunk R SB Limited 25%  Trunk L SB WFL  Trunk R rotation WFL  Trunk L rotation Limited 25%    Ankle ROM Seated: Motion: AROM seated Right Left  Plantarflexion 4+ 3  Dorsiflexion 4+ 4  Inversion 4 3+  Eversion 4+ 3+    SHOULDER SPECIAL TESTS: Impingement tests:  unable to test due to pain in starting position SLAP lesions:  unable to test due to pain in starting position Instability tests:  unable to test due to pain in starting position Rotator cuff assessment:  unable to test due to pain in starting position Biceps assessment:  unable to test due to pain in starting position   LOW BACK SPECIAL TEST: SLR: negative  Scour test: negative FABER: negative Pelvic compression/distraction: negative  Slump test positive LLE  6 min walk test: 1160 ft increased left foot drag and antalgic gait pattern with foot eversion    JOINT MOBILITY TESTING:  AP: painful PA: painful Inferior: painful Distraction: reduces pain   Back mobilization hypomobile and painful to grade II mobilizations    PALPATION:  Tenderness to all joint regions of GH     TODAY'S TREATMENT: DATE: 01/17/24  Manual:  Grade II mobilizations thoracic and lumbar spine x 8 minutes  Therex: Supine: -posterior pelvic tilt 10x -thoracic rotation 10x  -nerve glide 15x each LE -single knee to chest 60 seconds each LE -figure four stretch 60 seconds each LE -GTB abduction 15x  Seated:  -mini rollout swiss ball 15x -breathing 360 x multiple reps  -GTB hamstring curl 15x -GTB PF press 15   PATIENT EDUCATION: Education details: goals, POC  Person educated: Patient Education method: Explanation, Demonstration, Tactile cues, Verbal  cues, and Handouts Education comprehension: verbalized understanding, returned demonstration, verbal cues required, and tactile cues required  HOME EXERCISE PROGRAM:  Access Code: [email or web address] URL: [email or web address] Date: 10/03/2023 Prepared by: Precious Bard  Exercises - Seated Scapular Retraction  - 1 x daily - 7 x weekly - 2 sets - 10 reps - 5 hold - Neck Sidebending  - 1 x daily - 7 x weekly - 2 sets - 2 reps - 30 hold - Supine Isometric Shoulder Extension with Towel  - 1 x daily - 7 x weekly - 2 sets - 10 reps - 5 hold - Supine Isometric Shoulder Adduction with Towel Roll  - 1 x daily - 7 x weekly - 2 sets - 10 reps - 5 hold - Seated Bilateral Shoulder Flexion Towel Slide at Table Top  - 1 x daily - 7 x weekly - 1 sets - 3 reps -  30 hold - Supine Posterior Pelvic Tilt  - 1 x daily - 7 x weekly - 2 sets - 5 reps - 30 hold - Supine LE Neural Mobilization  - 1 x daily - 7 x weekly - 2 sets - 10 reps - 5 hold - Seated Ankle Alphabet  - 1 x daily - 7 x weekly - 2 sets - 1 reps - 5 hold  ASSESSMENT:  CLINICAL IMPRESSION:  Patient continues to have thoracic paraspinal spasming throughout session that does improve by end of session. Encouraged to speak with therapist about pain and mind connection further. Education on stress and pain connection performed.    Pt will continue to benefit from skilled therapy to address remaining deficits in order to improve overall QoL and return to PLOF.      OBJECTIVE IMPAIRMENTS: cardiopulmonary status limiting activity, decreased activity tolerance, decreased mobility, increased fascial restrictions, impaired perceived functional ability, increased muscle spasms, impaired flexibility, impaired UE functional use, improper body mechanics, postural dysfunction, and pain.   ACTIVITY LIMITATIONS: carrying, lifting, bed mobility, bathing, dressing, self feeding, reach over head, and caring for others  PARTICIPATION LIMITATIONS: meal prep,  cleaning, laundry, personal finances, interpersonal relationship, driving, shopping, community activity, yard work, church, and caregiving for husband  PERSONAL FACTORS: Age, Fitness, Past/current experiences, Time since onset of injury/illness/exacerbation, and 3+ comorbidities: aorta type A dissection in sept 2011 with aortic valve replacement bypass of R coronary artery, AAA, anemia, anxiety, CAD, CHF, depression, GERD, Heart failure, HLD, HTN, IBS, Kidney problems, migraine, osteoarthritis, osteoporosis, RA, ocular migraines, back pain  are also affecting patient's functional outcome.   REHAB POTENTIAL: Fair    CLINICAL DECISION MAKING: Evolving/moderate complexity  EVALUATION COMPLEXITY: Moderate   GOALS: Goals reviewed with patient? Yes  SHORT TERM GOALS: Target date: 12/27/2023  Patient will be independent in home exercise program to improve strength/mobility for better functional independence with ADLs. Baseline:7/23: HEP given 9/16: compliant 1x/week  Goal status: On going     LONG TERM GOALS: Target date: 01/24/2024    Patient will increase FOTO score to equal to or greater than  57%   to demonstrate statistically significant improvement in mobility and quality of life.  Baseline: 7/22: 46 9/16: 50% 10/7: 40% 12/10: 57% Goal status: MET  2.  Patient will improve shoulder AROM to > 140 degrees of flexion, scaption, and abduction for improved ability to perform overhead activities. Baseline: 7/22: see above 9/16: see above 10/7 : defer to exacerbation 12/10: shoulder abduction >140, flexion < 140 , assess scaption next visit  Goal status: Partially Met   3.  Patient will demonstrate adequate shoulder ROM and strength to be able to shave and dress independently with pain less than 3/10. Baseline: 7/23: 6/10 pain with dressing, limited ROM, unable to test strength 9/16: 3/10 pain with dressing  Goal status: MET  4.  Patient will decrease Quick DASH score by > 8 points  demonstrating reduced self-reported upper extremity disability for L and R shoulder  Baseline: 7/23: L shoulder 50% 9/16: R=54.54 % L 66% L 81% R 24% 12/10: R: 43.2% L: 56.8%  Goal status: IN PROGRESS  5.  Patient will be able to eat an entire meal without resting arm demonstrating improved functional strength Baseline: 7/23: has to rest while eating due to pain. 9/16: able to make halfway through meal 10/7: can make it about halfway through meal, some meals can get 3/4s through meal 12/10: improved, still has to rest every once in a while  Goal status: Partially Met  6.  Patient will be able to stand 15 minutes without her LLE going numb allowing for ADLS such as showering and iADLs such as cooking Baseline: 5 minutes 12/10: will assess next visit  Goal status: IN PROGRESS  7. Patient will increase six minute walk test distance to >1500 for progression to community ambulator and improve gait ability Baseline: 1160 ft  12/10: 1010 ft  Goal status: IN PROGRESS  PLAN:  PT FREQUENCY: 2x/week  PT DURATION: 8 weeks  PLANNED INTERVENTIONS: Therapeutic exercises, Therapeutic activity, Neuromuscular re-education, Balance training, Gait training, Patient/Family education, Self Care, Joint mobilization, Vestibular training, Canalith repositioning, Visual/preceptual remediation/compensation, Dry Needling, Electrical stimulation, Spinal mobilization, Cryotherapy, Moist heat, Compression bandaging, scar mobilization, Splintting, Taping, Traction, Ultrasound, Biofeedback, Ionotophoresis 4mg /ml Dexamethasone, Manual therapy, and Re-evaluation  PLAN FOR NEXT SESSION:   Continue with LUE strengthening  L ankle strengthening, distraction, core activation, reassess scaption and flexion ROM, make HEP     Precious Bard PT  Physical Therapist - [facility]  [facility]  01/17/24, 12:38 PM

## 2024-01-16 NOTE — Progress Notes (Signed)
Virtual Visit via Telephone Note   Because of Rachel Torres's co-morbid illnesses, she is at least at moderate risk for complications without adequate follow up.  This format is felt to be most appropriate for this patient at this time.  The patient did not have access to video technology/had technical difficulties with video requiring transitioning to audio format only (telephone).  All issues noted in this document were discussed and addressed.  No physical exam could be performed with this format.  Please refer to the patient's chart for her consent to telehealth for [facility].  Evaluation Performed:  Preoperative cardiovascular risk assessment _____________   Date:  01/16/2024   Patient ID:  Rachel Torres, DOB 1956-02-01, MRN [id number] Patient Location:  Home Provider location:   Office  Primary Care Provider:  [name], MD Primary Cardiologist:  Julien Nordmann, MD  Chief Complaint / Patient Profile   68 y.o. y/o female with a h/o   Coronary artery disease  Type A aortic dissection  Bicuspid AV S/p CABG (S-RCA), bioprosthetic AVR and aortic dissection repair in 08/2010 Residual aortic dissection extending into iliac artery-followed by vascular surgery Myoview 09/30/2022: No ischemia, EF 45, low risk Abdominal aortic aneurysm  HFmrEF (heart failure with mildly reduced ejection fraction)  TTE in 2012: EF 30-35, 04/2015: 40-45 TTE 09/23/2022: EF 45-50, global HK, GR 2 DD, normal RVSF, mild-moderate MR, AVR with mean gradient 5 RAP 3, aortic root 36 Persistent atrial fibrillation  S/p TEE guided DCCV 02/2023 Hypertension  Hyperlipidemia  Rheumatoid arthritis   who is pending colonoscopy with Dr. Tobi Bastos 01/19/2024 under general anesthesia and presents today for telephonic preoperative cardiovascular risk assessment.  Our Pharm.D. team has reviewed her records to help make recommendations regarding holding anticoagulation. Per office protocol, patient can  hold Eliquis for 2 days prior to procedure.      History of Present Illness    Rachel Torres is a 68 y.o. female who presents via Web designer for a telehealth visit today.  Pt was last seen in cardiology clinic on 06/17/23 by Dr. [name].  At that time Rachel Torres was doing well.  The patient is now pending procedure as outlined above. Since her last visit, she has been admitted to the hospital at [facility] in December for back pain.  CT demonstrated stable chronic dissection without significant change.  She also had a follow-up with vascular surgery at [facility] in January.  It was noted that her aorta findings are stable.  She had COVID starting several weeks ago that led to significant coughing which caused her back pain.  Her cough and shortness of breath has significantly improved.  She has not had chest discomfort to suggest angina.    Physical Exam    Vital Signs:  Rachel Torres does not have vital signs available for review today.  Given telephonic nature of communication, physical exam is limited. AAOx3. NAD. Normal affect.  Speech and respirations are unlabored.  Accessory Clinical Findings    None    Assessment & Plan    Assessment & Plan Preoperative cardiovascular examination Rachel Torres's perioperative risk of a major cardiac event is 6.6% according to the Revised Cardiac Risk Index (RCRI).  Therefore, she is at high risk for perioperative complications.   Her functional capacity is fair at 4.3 METs according to the Duke Activity Status Index (DASI). Recommendations: According to ACC/AHA guidelines, no further cardiovascular testing needed.  The patient may proceed to surgery at acceptable  risk.   Antiplatelet and/or Anticoagulation Recommendations: Eliquis (Apixaban) can be held for 2 days prior to surgery.  Please resume post op when felt to be safe.     A copy of this note will be routed to requesting surgeon.  Time:   Today, I have spent 10.25 minutes with  the patient with telehealth technology discussing medical history, symptoms, and management plan.     Rachel Newcomer, PA-C  01/16/2024, 8:38 PM

## 2024-01-17 ENCOUNTER — Ambulatory Visit (INDEPENDENT_AMBULATORY_CARE_PROVIDER_SITE_OTHER): Payer: Medicare HMO | Admitting: Psychology

## 2024-01-17 ENCOUNTER — Ambulatory Visit: Payer: Medicare HMO

## 2024-01-17 DIAGNOSIS — R2689 Other abnormalities of gait and mobility: Secondary | ICD-10-CM

## 2024-01-17 DIAGNOSIS — M5459 Other low back pain: Secondary | ICD-10-CM

## 2024-01-17 DIAGNOSIS — M25612 Stiffness of left shoulder, not elsewhere classified: Secondary | ICD-10-CM

## 2024-01-17 DIAGNOSIS — M25512 Pain in left shoulder: Secondary | ICD-10-CM | POA: Diagnosis not present

## 2024-01-17 DIAGNOSIS — F331 Major depressive disorder, recurrent, moderate: Secondary | ICD-10-CM | POA: Diagnosis not present

## 2024-01-17 DIAGNOSIS — M25511 Pain in right shoulder: Secondary | ICD-10-CM | POA: Diagnosis not present

## 2024-01-17 DIAGNOSIS — R293 Abnormal posture: Secondary | ICD-10-CM

## 2024-01-17 DIAGNOSIS — G8929 Other chronic pain: Secondary | ICD-10-CM | POA: Diagnosis not present

## 2024-01-17 NOTE — Telephone Encounter (Signed)
Cardiac Preop Clearance Granted.  Per notes received from 01/16/24 visit with [facility].  See below notes from 01/27/ 25.  Thanks, Marcelino Duster, CMA   Preoperative cardiovascular examination Ms. Fulop's perioperative risk of a major cardiac event is 6.6% according to the Revised Cardiac Risk Index (RCRI).  Therefore, she is at high risk for perioperative complications.   Her functional capacity is fair at 4.3 METs according to the Duke Activity Status Index (DASI). Recommendations: According to ACC/AHA guidelines, no further cardiovascular testing needed.  The patient may proceed to surgery at acceptable risk.   Antiplatelet and/or Anticoagulation Recommendations: Eliquis (Apixaban) can be held for 2 days prior to surgery.  Please resume post op when felt to be safe.

## 2024-01-17 NOTE — Progress Notes (Signed)
[facility] Counselor/Therapist Progress Note  Patient ID: CHANTAVIA BAZZLE, MRN: [id number],    Date: 01/17/2024  Time Spent: 10:00am-10:55am  55 minutes   Treatment Type: Individual Therapy  Reported Symptoms: stress  Mental Status Exam: Appearance:  Casual     Behavior: Appropriate  Motor: Normal  Speech/Language:  Normal Rate  Affect: Appropriate  Mood: normal  Thought process: normal  Thought content:   WNL  Sensory/Perceptual disturbances:   WNL  Orientation: oriented to person, place, time/date, and situation  Attention: Good  Concentration: Good  Memory: WNL  Fund of knowledge:  Good  Insight:   Good  Judgment:  Good  Impulse Control: Good   Risk Assessment: Danger to Self:  No Self-injurious Behavior: No Danger to Others: No Duty to Warn:no Physical Aggression / Violence:No  Access to Firearms a concern: No  Gang Involvement:No   Subjective: Pt present for face-to-face individual therapy via video.  Pt consents to telehealth video session and is aware of limitations and benefits of virtual sessions.  Location of pt: home Location of therapist: home office.  Pt talked about this being a difficult month bc of anniversaries of the deaths of family members.   [name] brother died 60 years ago.  [name] niece died a few years ago.  Helped pt process her feelings and grief.   Pt talked about [name].     Pt states [name] has been increasingly more difficult to manage.  [name] fell 4 times last week.  His memory is declining.   Pt is feeling very overwhelmed and disorganized.     Pt states she is also feeling overwhelmed bc everyone depends on her.  Addressed how pt can set healthy boundaries with extended family.   Worked on self care strategies.  Provided supportive therapy.  Interventions: Cognitive Behavioral Therapy and Insight-Oriented  Diagnosis:  F33.1  Plan of Care: Recommend ongoing therapy.   Pt participated in setting treatment  goals.   Pt needs a place to talk about stressors.  Plan to meet every two weeks.    Treatment Plan (target date: 03/23/2024) Client Abilities/Strengths  Pt is bright, engaging, and motivated for therapy.   Client Treatment Preferences  Individual therapy.  Client Statement of Needs  Improve coping skills.  Have a place to talk about stressors.   Symptoms  Depressed or irritable mood. Unresolved grief issues.  Problems Addressed  Unipolar Depression Goals 1. Alleviate depressive symptoms and return to previous level of effective functioning. 2. Appropriately grieve the loss in order to normalize mood and to return to previously adaptive level of functioning. Objective Learn and implement behavioral strategies to overcome depression. Target Date: 2024-03-23 Frequency: Biweekly  Progress: 40 Modality: individual  Related Interventions Engage the client in "behavioral activation," increasing his/her activity level and contact with sources of reward, while identifying processes that inhibit activation.  Use behavioral techniques such as instruction, rehearsal, role-playing, role reversal, as needed, to facilitate activity in the client's daily life; reinforce success. Assist the client in developing skills that increase the likelihood of deriving pleasure from behavioral activation (e.g., assertiveness skills, developing an exercise plan, less internal/more external focus, increased social involvement); reinforce success. Objective Identify important people in life, past and present, and describe the quality, good and poor, of those relationships. Target Date: 2024-03-23 Frequency: Biweekly  Progress: 40 Modality: individual  Related Interventions Conduct Interpersonal Therapy beginning with the assessment of the client's "interpersonal inventory" of important past and present relationships; develop  a case formulation linking depression to grief, interpersonal role disputes, role transitions,  and/or interpersonal deficits). Objective Learn and implement problem-solving and decision-making skills. Target Date: 2024-03-23 Frequency: Biweekly  Progress: 40 Modality: individual  Related Interventions Conduct Problem-Solving Therapy using techniques such as psychoeducation, modeling, and role-playing to teach client problem-solving skills (i.e., defining a problem specifically, generating possible solutions, evaluating the pros and cons of each solution, selecting and implementing a plan of action, evaluating the efficacy of the plan, accepting or revising the plan); role-play application of the problem-solving skill to a real life issue. Encourage in the client the development of a positive problem orientation in which problems and solving them are viewed as a natural part of life and not something to be feared, despaired, or avoided. 3. Develop healthy interpersonal relationships that lead to the alleviation and help prevent the relapse of depression. 4. Develop healthy thinking patterns and beliefs about self, others, and the world that lead to the alleviation and help prevent the relapse of depression. 5. Recognize, accept, and cope with feelings of depression. Diagnosis F33.1  Conditions For Discharge Achievement of treatment goals and objectives   Salomon Fick, LCSW

## 2024-01-18 ENCOUNTER — Encounter: Payer: Self-pay | Admitting: Family Medicine

## 2024-01-18 ENCOUNTER — Ambulatory Visit (INDEPENDENT_AMBULATORY_CARE_PROVIDER_SITE_OTHER): Payer: Medicare HMO | Admitting: Family Medicine

## 2024-01-18 ENCOUNTER — Other Ambulatory Visit: Payer: Medicare HMO

## 2024-01-18 VITALS — BP 136/86 | HR 75 | Temp 97.9°F | Ht 64.5 in | Wt 213.4 lb

## 2024-01-18 DIAGNOSIS — K219 Gastro-esophageal reflux disease without esophagitis: Secondary | ICD-10-CM

## 2024-01-18 DIAGNOSIS — M810 Age-related osteoporosis without current pathological fracture: Secondary | ICD-10-CM

## 2024-01-18 DIAGNOSIS — I5032 Chronic diastolic (congestive) heart failure: Secondary | ICD-10-CM | POA: Insufficient documentation

## 2024-01-18 DIAGNOSIS — E559 Vitamin D deficiency, unspecified: Secondary | ICD-10-CM | POA: Diagnosis not present

## 2024-01-18 DIAGNOSIS — F418 Other specified anxiety disorders: Secondary | ICD-10-CM

## 2024-01-18 DIAGNOSIS — Z8619 Personal history of other infectious and parasitic diseases: Secondary | ICD-10-CM

## 2024-01-18 DIAGNOSIS — E538 Deficiency of other specified B group vitamins: Secondary | ICD-10-CM

## 2024-01-18 DIAGNOSIS — Z0000 Encounter for general adult medical examination without abnormal findings: Secondary | ICD-10-CM | POA: Diagnosis not present

## 2024-01-18 DIAGNOSIS — I10 Essential (primary) hypertension: Secondary | ICD-10-CM | POA: Diagnosis not present

## 2024-01-18 DIAGNOSIS — E782 Mixed hyperlipidemia: Secondary | ICD-10-CM

## 2024-01-18 DIAGNOSIS — I714 Abdominal aortic aneurysm, without rupture, unspecified: Secondary | ICD-10-CM

## 2024-01-18 DIAGNOSIS — R7303 Prediabetes: Secondary | ICD-10-CM | POA: Diagnosis not present

## 2024-01-18 DIAGNOSIS — Z1211 Encounter for screening for malignant neoplasm of colon: Secondary | ICD-10-CM

## 2024-01-18 DIAGNOSIS — I480 Paroxysmal atrial fibrillation: Secondary | ICD-10-CM

## 2024-01-18 LAB — TSH: TSH: 0.68 u[IU]/mL (ref 0.35–5.50)

## 2024-01-18 LAB — CBC WITH DIFFERENTIAL/PLATELET
Basophils Absolute: 0 10*3/uL (ref 0.0–0.1)
Basophils Relative: 0.4 % (ref 0.0–3.0)
Eosinophils Absolute: 0.2 10*3/uL (ref 0.0–0.7)
Eosinophils Relative: 3.7 % (ref 0.0–5.0)
HCT: 40.6 % (ref 36.0–46.0)
Hemoglobin: 13.5 g/dL (ref 12.0–15.0)
Lymphocytes Relative: 28.1 % (ref 12.0–46.0)
Lymphs Abs: 1.5 10*3/uL (ref 0.7–4.0)
MCHC: 33.1 g/dL (ref 30.0–36.0)
MCV: 97.3 fL (ref 78.0–100.0)
Monocytes Absolute: 0.5 10*3/uL (ref 0.1–1.0)
Monocytes Relative: 9.1 % (ref 3.0–12.0)
Neutro Abs: 3.2 10*3/uL (ref 1.4–7.7)
Neutrophils Relative %: 58.7 % (ref 43.0–77.0)
Platelets: 257 10*3/uL (ref 150.0–400.0)
RBC: 4.17 Mil/uL (ref 3.87–5.11)
RDW: 14.7 % (ref 11.5–15.5)
WBC: 5.5 10*3/uL (ref 4.0–10.5)

## 2024-01-18 LAB — COMPREHENSIVE METABOLIC PANEL
ALT: 20 U/L (ref 0–35)
AST: 20 U/L (ref 0–37)
Albumin: 4.2 g/dL (ref 3.5–5.2)
Alkaline Phosphatase: 81 U/L (ref 39–117)
BUN: 20 mg/dL (ref 6–23)
CO2: 31 meq/L (ref 19–32)
Calcium: 9.1 mg/dL (ref 8.4–10.5)
Chloride: 101 meq/L (ref 96–112)
Creatinine, Ser: 0.91 mg/dL (ref 0.40–1.20)
GFR: 65.86 mL/min (ref >60.00)
Glucose, Bld: 98 mg/dL (ref 70–99)
Potassium: 4.4 meq/L (ref 3.5–5.1)
Sodium: 137 meq/L (ref 135–145)
Total Bilirubin: 0.5 mg/dL (ref 0.2–1.2)
Total Protein: 6.9 g/dL (ref 6.0–8.3)

## 2024-01-18 LAB — VITAMIN D 25 HYDROXY (VIT D DEFICIENCY, FRACTURES): VITD: 48.64 ng/mL (ref 30.00–100.00)

## 2024-01-18 LAB — LIPID PANEL
Cholesterol: 191 mg/dL (ref 0–200)
HDL: 59.3 mg/dL (ref >39.00)
LDL Cholesterol: 112 mg/dL — ABNORMAL HIGH (ref 0–99)
NonHDL: 131.84
Total CHOL/HDL Ratio: 3
Triglycerides: 100 mg/dL (ref 0.0–149.0)
VLDL: 20 mg/dL (ref 0.0–40.0)

## 2024-01-18 LAB — HEMOGLOBIN A1C: Hgb A1c MFr Bld: 6.3 % (ref 4.6–6.5)

## 2024-01-18 LAB — VITAMIN B12: Vitamin B-12: 438 pg/mL (ref 211–911)

## 2024-01-18 NOTE — Assessment & Plan Note (Signed)
Colonoscopy 12/2012 and next one is scheduled tomorrow

## 2024-01-18 NOTE — Assessment & Plan Note (Signed)
Lab Results  Component Value Date   VITAMINB12 438 01/18/2024   Continue oral supplementation

## 2024-01-18 NOTE — Assessment & Plan Note (Signed)
Continues omeprazole 40 mg daily  Encouraged to avoid triggers Working on weight loss

## 2024-01-18 NOTE — Assessment & Plan Note (Signed)
Encouraged regular gyn care

## 2024-01-18 NOTE — Assessment & Plan Note (Signed)
Disc goals for lipids and reasons to control them Rev last labs with pt Rev low sat fat diet in detail Labs today  Atorvastatin 20 and zetia 10 mg daily  Under cardiology care

## 2024-01-18 NOTE — Assessment & Plan Note (Signed)
BP: 136/86  Forgot am medication today (prepping for colonoscopy) Under cardiology care Controlled  Lisinopril 2.5 mg four days weekly Lasix 20 mg four days  Coreg 6.25 mg bid  Imdur 15 mg daily

## 2024-01-18 NOTE — Assessment & Plan Note (Signed)
Vitamin D level is therapeutic with current supplementation Disc importance of this to bone and overall health

## 2024-01-18 NOTE — Patient Instructions (Addendum)
Get your 2nd shingrix vaccine when you can   Read about the generic versions for reclast and prolia  You need all dental work done before considering any medicine for osteoporosis   Add some strength training to your routine, this is important for bone and brain health and can reduce your risk of falls and help your body use insulin properly and regulate weight  Light weights, exercise bands , and internet videos are a good way to start  Yoga (chair or regular), machines , floor exercises or a gym with machines are also good options    Take care of yourself  Get back to better diet and self care when you can

## 2024-01-18 NOTE — Assessment & Plan Note (Signed)
Continues cardiology care  Eliquis 5 mg bid  Coreg 12.5 mg bid

## 2024-01-18 NOTE — Assessment & Plan Note (Signed)
PHQ 7 Per pt getting by ok in high stress setting  Continues effexor 187.5 mg total daily  Sees counselor  Encouraged good self care

## 2024-01-18 NOTE — Progress Notes (Signed)
Subjective:    Patient ID: Rachel Torres, female    DOB: 1956-10-09, 68 y.o.   MRN: [id number]  HPI  Here for health maintenance exam and to review chronic medical problems   Wt Readings from Last 3 Encounters:  01/18/24 213 lb 6 oz (96.8 kg)  12/29/23 224 lb 2 oz (101.7 kg)  12/29/23 175 lb (79.4 kg)   36.06 kg/m  Vitals:   01/18/24 1046  BP: 136/86  Pulse: 75  Temp: 97.9 F (36.6 C)  SpO2: 96%    Immunization History  Administered Date(s) Administered   H1N1 01/09/2009   Influenza Split 09/20/2011, 09/11/2012   Influenza Whole 12/29/2006, 09/19/2008, 10/08/2010   Influenza, Seasonal, Injecte, Preservative Fre 10/08/2010   Influenza,inj,Quad PF,6+ Mos 10/08/2013, 08/13/2016, 10/27/2017, 10/05/2018, 09/17/2019   Influenza-Unspecified 09/26/2015, 09/15/2023   PFIZER Comirnaty(Gray Top)Covid-19 Tri-Sucrose Vaccine 05/15/2021   PFIZER(Purple Top)SARS-COV-2 Vaccination 03/12/2020, 04/02/2020, 10/10/2020   PNEUMOCOCCAL CONJUGATE-20 01/14/2023   Pneumococcal Polysaccharide-23 12/29/2006   Td 01/09/2009, 04/03/2019    There are no preventive care reminders to display for this patient.  Shingrix -had first one /due for 2nd    Mammogram 07/2023  Self breast exam- no lumps   Gyn health No changes or c/o  Has urinary incontinence  Wears pad    Colon cancer screening - colonoscopy 12/2012  Has one scheduled tomorrow (prep today)    Bone health  Dexa  07/2023  osteoporosis In the middle of dental work and getting extraction  Falls- several (her husband with parkinsons falls on her_  [employer]  Had GI side effects from alendronate  Started calcium but it makes her leg cramps worse  Vit D3 2000 international units daily  Last vitamin D Lab Results  Component Value Date   VD25OH 48.64 01/18/2024    Exercise  No time -caring for husband/heavy lifting   Eye care- up to date  Cataracts   Skin care - nothing new   Mood    01/18/2024    10:50 AM 12/29/2023    8:24 AM 11/22/2023   12:06 PM 07/12/2023    9:59 AM 06/03/2023   11:59 AM  Depression screen PHQ 2/9  Decreased Interest 0 1 1 1 2   Down, Depressed, Hopeless 0 1 1 1 1   PHQ - 2 Score 0 2 2 2 3   Altered sleeping 3 2 2 2 3   Tired, decreased energy 1 2 2 2 1   Change in appetite 2 0 0 1 3  Feeling bad or failure about yourself  0 0 0 0 0  Trouble concentrating 1 0 0 1 1  Moving slowly or fidgety/restless 0 0 0 0 1  Suicidal thoughts 0 0 0 0 0  PHQ-9 Score 7 6 6 8 12   Difficult doing work/chores Not difficult at all  Somewhat difficult  Very difficult   Effexor xr 150 and 37.5 mg daily Is helping   Wants to drop dose after stress goes down     HTN bp is stable today  No cp or palpitations or headaches or edema  No side effects to medicines  BP Readings from Last 3 Encounters:  01/18/24 136/86  12/29/23 124/78  12/21/23 131/63    Lisiniopril 2.5 mg four days weekly  Lasix 20 mg four days Coreg 12.5 mg bid Imdur 15 mg daily   Forgot med this am   Lab Results  Component Value Date   NA 137 01/18/2024   K 4.4 01/18/2024   CO2 31  01/18/2024   GLUCOSE 98 01/18/2024   BUN 20 01/18/2024   CREATININE 0.91 01/18/2024   CALCIUM 9.1 01/18/2024   GFR 65.86 01/18/2024   EGFR 63 08/17/2023   GFRNONAA 68 07/23/2020   Labs pending from today   B12 def Lab Results  Component Value Date   VITAMINB12 438 01/18/2024     Omeprazole Gerd overall stable  Wants to loose weight   Cardiology care  A fib CHF  Past aoric dissectoin   Hyperlipidemia Lab Results  Component Value Date   CHOL 191 01/18/2024   HDL 59.30 01/18/2024   LDLCALC 112 (H) 01/18/2024   LDLDIRECT 189.4 10/13/2011   TRIG 100.0 01/18/2024   CHOLHDL 3 01/18/2024   Atorvastatin 20 mg four times weekly Zetia 10 mg daily   Prediabetes  Lab Results  Component Value Date   HGBA1C 6.3 01/18/2024   HGBA1C 5.8 (H) 08/17/2023   HGBA1C 5.9 (H) 02/17/2023   Not eating like she  should due to stress  Has not been back to weight clinic since she got uri  Planning to go back   Patient Active Problem List   Diagnosis Date Noted   Heart failure with improved ejection fraction (HFimpEF) (HCC) 01/18/2024   Abnormal metabolism 11/14/2023   Weight regain after inital weight loss 11/14/2023   Multiple closed tarsal fractures of left foot, sequela 07/12/2023   Arm contusion, right, sequela 07/12/2023   Lumbar disc disease 07/12/2023   PAF (paroxysmal atrial fibrillation) (HCC) 06/09/2023   Paresthesia 06/03/2023   Obesity, Beginning BMI 35.45 02/17/2023   Tinnitus 01/14/2023   Change in vision 01/14/2023   Colon cancer screening 01/14/2023   Personal history of fall 01/14/2023   Problem related to unspecified psychosocial circumstances 12/28/2022   Person encountering health services to consult on behalf of another person 12/28/2022   Palpitation 12/10/2022   Fatigue 10/26/2022   B12 deficiency 10/26/2022   Right shoulder pain 09/15/2022   Depression 09/14/2022   Other constipation 05/12/2022   Medicare annual wellness visit, initial 07/17/2021   TMJ (dislocation of temporomandibular joint) 07/17/2021   Encounter for screening mammogram for breast cancer 06/05/2021   Diarrhea 04/11/2020   Epigastric pain 02/19/2020   Dark stools 02/19/2020   History of atrial fibrillation 02/05/2019   Shortness of breath 01/27/2019   Irregular surface of cornea 12/25/2018   History of HPV infection 12/01/2016   Screening mammogram, encounter for 11/29/2016   Estrogen deficiency 11/29/2016   Encounter for routine gynecological examination 11/29/2016   Grade III hemorrhoids 10/06/2016   Tachycardia 08/14/2016   Hemorrhoids 08/13/2016   Atypical migraine 08/03/2016   CAD (coronary artery disease)    AAA (abdominal aortic aneurysm) (HCC)    Chronic combined systolic (congestive) and diastolic (congestive) heart failure (HCC)    Prediabetes 02/11/2016   Generalized anxiety  disorder 12/08/2015   Panic attacks 07/28/2015   Chest pain 07/09/2013   Sensorineural hearing loss 03/27/2013   Vitamin D deficiency 07/03/2012   H/O aortic dissection 05/12/2012   Routine general medical examination at a health care facility 09/20/2011   Atypical chest pain 09/15/2011   S/P AVR (aortic valve replacement) 01/07/2011   CORONARY ARTERY BYPASS GRAFT, HX OF 01/07/2011   Arthritis 12/20/2010   Back pain 12/15/2010   H/O dissecting abdominal aortic aneurysm repair 08/24/2010   IRRITABLE BOWEL SYNDROME 09/09/2009   Obstructive sleep apnea 08/04/2009   External hemorrhoid 06/26/2009   Osteoporosis 01/09/2009   Hyperlipidemia 07/26/2007   Depression with anxiety 07/26/2007  Essential hypertension 07/26/2007   Allergic rhinitis 07/26/2007   Asthma 07/26/2007   GERD 07/26/2007   Osteoarthritis 07/26/2007   Ocular migraine 07/26/2007   Mild intermittent asthma without complication 07/26/2007   Past Medical History:  Diagnosis Date   AAA (abdominal aortic aneurysm) (HCC)    a. Chronic w/o evidence of aneurysmal dil on CTA 01/2016.   Alcohol abuse    Allergy    Anemia    Anxiety    Asthma    CAD (coronary artery disease)    a. 08/2010 s/p CABG x 1 (VG->RCA) @ time of Ao dissection repair; b. 01/2016 Lexiscan MV: mid antsept/apical defect w/ ? peri-infarct ischemia-->likely attenuation-->Med Rx.   Chewing difficulty    Chronic combined systolic (congestive) and diastolic (congestive) heart failure (HCC)    a. 2012 EF 30-35%; b. 04/2015 EF 40-45%; c. 01/2016 Echo: Ef 55-65%, nl AoV bioprosthesis, nl RV, nl PASP.   Constipation    Depression    Edema of both lower extremities    Gallbladder sludge    GERD (gastroesophageal reflux disease)    Heart failure (HCC)    Heart valve problem    History of Bicuspid Aortic Valve    a. 08/2010 s/p AVR @ time of Ao dissection repair; b. 01/2016 Echo: Ef 55-65%, nl AoV bioprosthesis, nl RV, nl PASP.   Hx of repair of dissecting  thoracic aortic aneurysm, Stanford type A    a. 08/2010 s/p repair with AVR and VG->RCA; b. 01/2016 CTA: stable appearance of Asc Thoracic Aortic graft. Opacification of flase lumen of chronic abd Ao dissection w/ retrograde flow through lumbar arteries. No aneurysmal dil of flase lumen.   Hyperlipidemia    Hypertension    Hypertensive heart disease    IBS (irritable bowel syndrome)    Internal hemorrhoids    Kidney problem    Marfan syndrome    pt denies   Migraine    Obstructive sleep apnea    Osteoarthritis    Osteoporosis    Palpitations    Pneumonia    RA (rheumatoid arthritis) (HCC)    a. Followed by Dr. Mallie Mussel   Past Surgical History:  Procedure Laterality Date   ABDOMINAL AORTIC ANEURYSM REPAIR     CARDIAC CATHETERIZATION  2001   CESAREAN SECTION  1986 & 1991   CHOLECYSTECTOMY     COLONOSCOPY     ESOPHAGOGASTRODUODENOSCOPY (EGD) WITH PROPOFOL N/A 05/08/2020   Procedure: ESOPHAGOGASTRODUODENOSCOPY (EGD) WITH PROPOFOL;  Surgeon: Pasty Spillers, MD;  Location: [facility] ENDOSCOPY;  Service: Endoscopy;  Laterality: N/A;   FRACTURE SURGERY Right    shoulder replacement   HEMORRHOID BANDING     ORIF DISTAL RADIUS FRACTURE Left    UPPER GASTROINTESTINAL ENDOSCOPY     Social History   Tobacco Use   Smoking status: Former    Current packs/day: 0.00    Average packs/day: 0.8 packs/day for 1.5 years (1.1 ttl pk-yrs)    Types: Cigarettes    Start date: 06/20/1977    Quit date: 12/20/1978    Years since quitting: 45.1   Smokeless tobacco: Never  Vaping Use   Vaping status: Never Used  Substance Use Topics   Alcohol use: Yes    Comment: 2-3 glasses of red wine per month   Drug use: No   Family History  Problem Relation Age of Onset   Hypertension Mother    Depression Mother    Osteoporosis Mother    Stroke Mother    Irritable bowel syndrome Mother  Asthma Mother    Heart disease Mother    Thyroid disease Mother    Anxiety disorder Mother    Other Father 50    Arthritis Sister    Diabetes Sister    Heart disease Sister    Asthma Sister    Migraines Sister    Diabetes Sister    Nephrolithiasis Sister    Osteoporosis Sister    Arthritis Sister    Asthma Sister    Migraines Sister    Stroke Maternal Grandmother    Colon cancer Maternal Grandmother    Lymphoma Maternal Grandmother    Migraines Maternal Grandmother    Lung cancer Maternal Grandfather    Melanoma Maternal Grandfather    Aneurysm Paternal Grandmother        brain   Diabetes Paternal Grandmother    Arthritis Paternal Grandmother    Arthritis Paternal Grandfather    Diabetes Paternal Grandfather    Diabetes Brother    Other Brother        prediabeties   Asthma Brother    Migraines Brother    Breast cancer Neg Hx    Allergies  Allergen Reactions   Alendronate Sodium Other (See Comments)    GI upset   Gabapentin Other (See Comments)   Ginzing [Ginseng] Other (See Comments)    Hyper and jitery    Hydrocod Poli-Chlorphe Poli Er Itching   Hydrocodone-Acetaminophen Itching   Imitrex [Sumatriptan]     Contraindicated w/ heart condition   Oxycodone Itching   Peanut-Containing Drug Products Other (See Comments)    Reaction:  Migraines    Tramadol Other (See Comments)    itching   Hydrocodone-Guaifenesin Itching   Iodine Itching    Patient reports erroneous entry.   Prednisone Rash   Tape Itching   Current Outpatient Medications on File Prior to Visit  Medication Sig Dispense Refill   acetaminophen (TYLENOL) 500 MG tablet Take 500 mg by mouth every 6 (six) hours as needed.     albuterol (VENTOLIN HFA) 108 (90 Base) MCG/ACT inhaler Inhale 1-2 puffs into the lungs every 6 (six) hours as needed for wheezing or shortness of breath. 3 each 3   ALPRAZolam (XANAX) 0.5 MG tablet Take 1 tablet (0.5 mg total) by mouth daily as needed for anxiety. 30 tablet 0   aspirin EC 81 MG tablet Take 81 mg by mouth at bedtime.     atorvastatin (LIPITOR) 20 MG tablet Take 20 mg by mouth.  Four days per week     Calcium Carbonate (CALCIUM 600 PO) Take 600 mg by mouth.     carvedilol (COREG) 12.5 MG tablet Take 1 tablet (12.5 mg total) by mouth 2 (two) times daily. 180 tablet 0   cetirizine (ZYRTEC) 10 MG tablet Take 1 tablet (10 mg total) by mouth daily as needed for allergies. 90 tablet 3   Cholecalciferol (VITAMIN D3) 50 MCG (2000 UT) CAPS Take 2,000 capsules by mouth. FOUR TIMES A WEEK     cyclobenzaprine (FLEXERIL) 10 MG tablet Take 0.5-1 tablets (5-10 mg total) by mouth 3 (three) times daily as needed. For headache 90 tablet 3   dextromethorphan-guaiFENesin (MUCINEX DM) 30-600 MG 12hr tablet Take 1 tablet by mouth 2 (two) times daily.     ELIQUIS 5 MG TABS tablet Take 1 tablet (5 mg total) by mouth 2 (two) times daily. 180 tablet 3   ezetimibe (ZETIA) 10 MG tablet TAKE 1 TABLET EVERY DAY 90 tablet 3   fluticasone (FLONASE) 50 MCG/ACT nasal spray  Place 2 sprays into both nostrils daily as needed for allergies or rhinitis. 48 g 3   furosemide (LASIX) 20 MG tablet Take 2 tablets (40 mg total) by mouth daily. 180 tablet 3   isosorbide mononitrate (IMDUR) 30 MG 24 hr tablet TAKE 1/2 TABLET EVERY DAY 45 tablet 3   lisinopril (ZESTRIL) 5 MG tablet Take 0.5 tablets (2.5 mg total) by mouth daily. 90 tablet 3   magnesium gluconate (MAGONATE) 500 MG tablet Take 500 mg by mouth at bedtime.     nitroGLYCERIN (NITROSTAT) 0.4 MG SL tablet Place 1 tablet (0.4 mg total) under the tongue every 5 (five) minutes as needed for chest pain. 25 tablet 3   omeprazole (PRILOSEC) 40 MG capsule TAKE 1 CAPSULE EVERY DAY 90 capsule 1   polyethylene glycol powder (GLYCOLAX/MIRALAX) 17 GM/SCOOP powder Take 17 g by mouth as needed. 255 g 0   potassium chloride (KLOR-CON M) 10 MEQ tablet Take 1 tablet (10 mEq total) by mouth 2 (two) times daily. 180 tablet 3   promethazine (PHENERGAN) 25 MG tablet Take 0.5 tablets (12.5 mg total) by mouth every 8 (eight) hours as needed for nausea. 90 tablet 3   TART CHERRY PO  Take 1 capsule by mouth 2 (two) times daily.     topiramate (TOPAMAX) 50 MG tablet Take 1 tablet (50 mg total) by mouth at bedtime. 30 tablet 1   venlafaxine XR (EFFEXOR-XR) 150 MG 24 hr capsule TAKE 1 CAPSULE EVERY DAY WITH BREAKFAST 90 capsule 2   venlafaxine XR (EFFEXOR-XR) 37.5 MG 24 hr capsule Take 1 capsule (37.5 mg total) by mouth daily with breakfast. 90 capsule 2   No current facility-administered medications on file prior to visit.    Review of Systems  Constitutional:  Positive for fatigue. Negative for activity change, appetite change, fever and unexpected weight change.  HENT:  Negative for congestion, ear pain, rhinorrhea, sinus pressure and sore throat.   Eyes:  Negative for pain, redness and visual disturbance.  Respiratory:  Negative for cough, shortness of breath and wheezing.   Cardiovascular:  Negative for chest pain and palpitations.  Gastrointestinal:  Negative for abdominal pain, blood in stool, constipation and diarrhea.  Endocrine: Negative for polydipsia and polyuria.  Genitourinary:  Negative for dysuria, frequency and urgency.  Musculoskeletal:  Positive for arthralgias. Negative for back pain and myalgias.  Skin:  Negative for pallor and rash.  Allergic/Immunologic: Negative for environmental allergies.  Neurological:  Negative for dizziness, syncope and headaches.  Hematological:  Negative for adenopathy. Does not bruise/bleed easily.  Psychiatric/Behavioral:  Positive for dysphoric mood and sleep disturbance. Negative for decreased concentration. The patient is nervous/anxious.        Objective:   Physical Exam Constitutional:      General: She is not in acute distress.    Appearance: Normal appearance. She is well-developed. She is obese. She is not ill-appearing or diaphoretic.  HENT:     Head: Normocephalic and atraumatic.     Right Ear: Tympanic membrane, ear canal and external ear normal.     Left Ear: Tympanic membrane, ear canal and external ear  normal.     Nose: Nose normal. No congestion.     Mouth/Throat:     Mouth: Mucous membranes are moist.     Pharynx: Oropharynx is clear. No posterior oropharyngeal erythema.  Eyes:     General: No scleral icterus.    Extraocular Movements: Extraocular movements intact.     Conjunctiva/sclera: Conjunctivae normal.  Pupils: Pupils are equal, round, and reactive to light.  Neck:     Thyroid: No thyromegaly.     Vascular: No carotid bruit or JVD.  Cardiovascular:     Rate and Rhythm: Normal rate and regular rhythm.     Pulses: Normal pulses.     Heart sounds: Murmur heard.     No gallop.  Pulmonary:     Effort: Pulmonary effort is normal. No respiratory distress.     Breath sounds: Normal breath sounds. No wheezing.     Comments: Good air exch Chest:     Chest wall: No tenderness.  Abdominal:     General: Bowel sounds are normal. There is no distension or abdominal bruit.     Palpations: Abdomen is soft. There is no mass.     Tenderness: There is no abdominal tenderness.     Hernia: No hernia is present.  Genitourinary:    Comments: Breast exam: No mass, nodules, thickening, tenderness, bulging, retraction, inflamation, nipple discharge or skin changes noted.  No axillary or clavicular LA.     Musculoskeletal:        General: No tenderness. Normal range of motion.     Cervical back: Normal range of motion and neck supple. No rigidity. No muscular tenderness.     Right lower leg: No edema.     Left lower leg: No edema.     Comments: No kyphosis   Lymphadenopathy:     Cervical: No cervical adenopathy.  Skin:    General: Skin is warm and dry.     Coloration: Skin is not pale.     Findings: No erythema or rash.  Neurological:     Mental Status: She is alert. Mental status is at baseline.     Cranial Nerves: No cranial nerve deficit.     Motor: No abnormal muscle tone.     Coordination: Coordination normal.     Gait: Gait normal.     Deep Tendon Reflexes: Reflexes are  normal and symmetric. Reflexes normal.  Psychiatric:        Mood and Affect: Mood normal.        Cognition and Memory: Cognition and memory normal.           Assessment & Plan:   Problem List Items Addressed This Visit       Cardiovascular and Mediastinum   PAF (paroxysmal atrial fibrillation) (HCC)   Continues cardiology care  Eliquis 5 mg bid  Coreg 12.5 mg bid       Heart failure with improved ejection fraction (HFimpEF) (HCC)   No clinical changes Continues cardiology care  Lisinopril, lasix , coreg       Essential hypertension   BP: 136/86  Forgot am medication today (prepping for colonoscopy) Under cardiology care Controlled  Lisinopril 2.5 mg four days weekly Lasix 20 mg four days  Coreg 6.25 mg bid  Imdur 15 mg daily        AAA (abdominal aortic aneurysm) (HCC)   Utd vasc/card care  Cholesterol controlled  No symptoms         Digestive   GERD   Continues omeprazole 40 mg daily  Encouraged to avoid triggers Working on weight loss         Musculoskeletal and Integument   Osteoporosis   Dexa 07/2023  Several falls  Toe fractures noted  Pending dental work before she can start treatment  GI intol to alendronate May be candidate for reclast or prolia  Given  info to read about both  Discussed fall prevention, supplements and exercise for bone density          Other   Vitamin D deficiency   Vitamin D level is therapeutic with current supplementation Disc importance of this to bone and overall health       Routine general medical examination at a health care facility - Primary   Reviewed health habits including diet and exercise and skin cancer prevention Reviewed appropriate screening tests for age  Also reviewed health mt list, fam hx and immunization status , as well as social and family history   See HPI Labs reviewed and ordered / pending from this am  Due for 2nd shingrix  Colonoscopy planned tomorrow  Dexa utd  07/2023 Discussed fall prevention, supplements and exercise for bone density  Health Maintenance  Topic Date Due   COVID-19 Vaccine (5 - 2024-25 season) 03/19/2024*   Zoster (Shingles) Vaccine (1 of 2) 03/28/2024*   Colon Cancer Screening  12/28/2024*   Mammogram  08/17/2024   Medicare Annual Wellness Visit  12/28/2024   DTaP/Tdap/Td vaccine (3 - Tdap) 04/02/2029   Pneumonia Vaccine  Completed   Flu Shot  Completed   DEXA scan (bone density measurement)  Completed   Hepatitis C Screening  Completed   HPV Vaccine  Aged Out  *Topic was postponed. The date shown is not the original due date.   Phq 7 on therapy        Prediabetes   A1c pending disc imp of low glycemic diet and wt loss to prevent DM2  Working on weight loss       Hyperlipidemia   Disc goals for lipids and reasons to control them Rev last labs with pt Rev low sat fat diet in detail Labs today  Atorvastatin 20 and zetia 10 mg daily  Under cardiology care        History of HPV infection   Encouraged regular gyn care       Depression with anxiety   PHQ 7 Per pt getting by ok in high stress setting  Continues effexor 187.5 mg total daily  Sees counselor  Encouraged good self care        Colon cancer screening   Colonoscopy 12/2012 and next one is scheduled tomorrow       B12 deficiency   Lab Results  Component Value Date   VITAMINB12 438 01/18/2024   Continue oral supplementation

## 2024-01-18 NOTE — Assessment & Plan Note (Signed)
No clinical changes Continues cardiology care  Lisinopril, lasix , coreg

## 2024-01-18 NOTE — Assessment & Plan Note (Signed)
Utd vasc/card care  Cholesterol controlled  No symptoms

## 2024-01-18 NOTE — Assessment & Plan Note (Signed)
A1c pending disc imp of low glycemic diet and wt loss to prevent DM2  Working on weight loss

## 2024-01-18 NOTE — Assessment & Plan Note (Signed)
Reviewed health habits including diet and exercise and skin cancer prevention Reviewed appropriate screening tests for age  Also reviewed health mt list, fam hx and immunization status , as well as social and family history   See HPI Labs reviewed and ordered / pending from this am  Due for 2nd shingrix  Colonoscopy planned tomorrow  Dexa utd 07/2023 Discussed fall prevention, supplements and exercise for bone density  Health Maintenance  Topic Date Due   COVID-19 Vaccine (5 - 2024-25 season) 03/19/2024*   Zoster (Shingles) Vaccine (1 of 2) 03/28/2024*   Colon Cancer Screening  12/28/2024*   Mammogram  08/17/2024   Medicare Annual Wellness Visit  12/28/2024   DTaP/Tdap/Td vaccine (3 - Tdap) 04/02/2029   Pneumonia Vaccine  Completed   Flu Shot  Completed   DEXA scan (bone density measurement)  Completed   Hepatitis C Screening  Completed   HPV Vaccine  Aged Out  *Topic was postponed. The date shown is not the original due date.   Phq 7 on therapy

## 2024-01-18 NOTE — Therapy (Incomplete)
OUTPATIENT PHYSICAL THERAPY TREATMENT   Patient Name: Rachel Torres MRN: [id number] DOB:[birth year], 68 y.o., female Today's Date: 01/18/2024  END OF SESSION:                Past Medical History:  Diagnosis Date   AAA (abdominal aortic aneurysm) (HCC)    a. Chronic w/o evidence of aneurysmal dil on CTA 01/2016.   Alcohol abuse    Allergy    Anemia    Anxiety    Asthma    CAD (coronary artery disease)    a. 08/2010 s/p CABG x 1 (VG->RCA) @ time of Ao dissection repair; b. 01/2016 Lexiscan MV: mid antsept/apical defect w/ ? peri-infarct ischemia-->likely attenuation-->Med Rx.   Chewing difficulty    Chronic combined systolic (congestive) and diastolic (congestive) heart failure (HCC)    a. 2012 EF 30-35%; b. 04/2015 EF 40-45%; c. 01/2016 Echo: Ef 55-65%, nl AoV bioprosthesis, nl RV, nl PASP.   Constipation    Depression    Edema of both lower extremities    Gallbladder sludge    GERD (gastroesophageal reflux disease)    Heart failure (HCC)    Heart valve problem    History of Bicuspid Aortic Valve    a. 08/2010 s/p AVR @ time of Ao dissection repair; b. 01/2016 Echo: Ef 55-65%, nl AoV bioprosthesis, nl RV, nl PASP.   Hx of repair of dissecting thoracic aortic aneurysm, Stanford type A    a. 08/2010 s/p repair with AVR and VG->RCA; b. 01/2016 CTA: stable appearance of Asc Thoracic Aortic graft. Opacification of flase lumen of chronic abd Ao dissection w/ retrograde flow through lumbar arteries. No aneurysmal dil of flase lumen.   Hyperlipidemia    Hypertension    Hypertensive heart disease    IBS (irritable bowel syndrome)    Internal hemorrhoids    Kidney problem    Marfan syndrome    pt denies   Migraine    Obstructive sleep apnea    Osteoarthritis    Osteoporosis    Palpitations    Pneumonia    RA (rheumatoid arthritis) (HCC)    a. Followed by Dr. Mallie Mussel   Past Surgical History:  Procedure Laterality Date   ABDOMINAL AORTIC ANEURYSM REPAIR     CARDIAC  CATHETERIZATION  2001   CESAREAN SECTION  1986 & 1991   CHOLECYSTECTOMY     COLONOSCOPY     ESOPHAGOGASTRODUODENOSCOPY (EGD) WITH PROPOFOL N/A 05/08/2020   Procedure: ESOPHAGOGASTRODUODENOSCOPY (EGD) WITH PROPOFOL;  Surgeon: Pasty Spillers, MD;  Location: [facility] ENDOSCOPY;  Service: Endoscopy;  Laterality: N/A;   FRACTURE SURGERY Right    shoulder replacement   HEMORRHOID BANDING     ORIF DISTAL RADIUS FRACTURE Left    UPPER GASTROINTESTINAL ENDOSCOPY     Patient Active Problem List   Diagnosis Date Noted   Abnormal metabolism 11/14/2023   Weight regain after inital weight loss 11/14/2023   Multiple closed tarsal fractures of left foot, sequela 07/12/2023   Arm contusion, right, sequela 07/12/2023   Lumbar disc disease 07/12/2023   Atrial fibrillation (HCC) 06/09/2023   Paresthesia 06/03/2023   BMI 34.0-34.9,adult 02/17/2023   Obesity, Beginning BMI 35.45 02/17/2023   Tinnitus 01/14/2023   Change in vision 01/14/2023   Colon cancer screening 01/14/2023   Personal history of fall 01/14/2023   Problem related to unspecified psychosocial circumstances 12/28/2022   Person encountering health services to consult on behalf of another person 12/28/2022   Palpitation 12/10/2022   Fatigue 10/26/2022   B12  deficiency 10/26/2022   Right shoulder pain 09/15/2022   Depression 09/14/2022   Class 2 severe obesity with serious comorbidity and body mass index (BMI) of 35.0 to 35.9 in adult [facility]) 09/14/2022   Other constipation 05/12/2022   Medicare annual wellness visit, initial 07/17/2021   TMJ (dislocation of temporomandibular joint) 07/17/2021   Encounter for screening mammogram for breast cancer 06/05/2021   Diarrhea 04/11/2020   Epigastric pain 02/19/2020   Dark stools 02/19/2020   History of atrial fibrillation 02/05/2019   Shortness of breath 01/27/2019   Irregular surface of cornea 12/25/2018   HPV (human papilloma virus) infection 12/01/2016   Screening mammogram, encounter  for 11/29/2016   Estrogen deficiency 11/29/2016   Encounter for routine gynecological examination 11/29/2016   Grade III hemorrhoids 10/06/2016   Tachycardia 08/14/2016   Hemorrhoids 08/13/2016   Atypical migraine 08/03/2016   CAD (coronary artery disease)    AAA (abdominal aortic aneurysm) (HCC)    Chronic combined systolic (congestive) and diastolic (congestive) heart failure (HCC)    Prediabetes 02/11/2016   Generalized anxiety disorder 12/08/2015   Panic attacks 07/28/2015   Chest pain 07/09/2013   Sensorineural hearing loss 03/27/2013   Vitamin D deficiency 07/03/2012   H/O aortic dissection 05/12/2012   Routine general medical examination at a health care facility 09/20/2011   Atypical chest pain 09/15/2011   S/P AVR (aortic valve replacement) 01/07/2011   CORONARY ARTERY BYPASS GRAFT, HX OF 01/07/2011   Arthritis 12/20/2010   Back pain 12/15/2010   H/O dissecting abdominal aortic aneurysm repair 08/24/2010   IRRITABLE BOWEL SYNDROME 09/09/2009   Obstructive sleep apnea 08/04/2009   External hemorrhoid 06/26/2009   Osteoporosis 01/09/2009   Hyperlipidemia 07/26/2007   Depression with anxiety 07/26/2007   Essential hypertension 07/26/2007   Allergic rhinitis 07/26/2007   Asthma 07/26/2007   GERD 07/26/2007   Osteoarthritis 07/26/2007   Ocular migraine 07/26/2007   Mild intermittent asthma without complication 07/26/2007    PCP: [name] MD   REFERRING PROVIDER: Stormy Card  REFERRING DIAG: L rotator cuff   THERAPY DIAG:  No diagnosis found.  Rationale for Evaluation and Treatment: Rehabilitation  ONSET DATE: Feb 2024  SUBJECTIVE:                                                                                                                                                                                      SUBJECTIVE STATEMENT: ***  Hand dominance: Left  PERTINENT HISTORY: Rachel Torres is a 66yoF familiar to our services. Pt referred to PT  for strengthening of chronic left shoulder DJD, known remote RC injury. Recently fell and broke four metatarsals, since cleared from use  of CAM rocker. She was treated successfully in past for right shoulder pain and has past medical history of Right shoulder surgery replacement. 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 PMH aorta type A dissection in sept 2011 s/p aortic valve replacement, bypass of R coronary artery, anemia, anxiety, CAD, CHF, depression, GERD, Heart failure, HLD, HTN, IBS, migraine, osteoarthritis, osteoporosis, RA, back pain.    Back pain:  Patient has new referral for radiculopathy, lumbar region. MRI showed degeneration of L1-2 and L2-3 with some disc space collapse and minor retrolisthesis of L2-3. Issue began in February, felt numbness in L thigh. No cause known, but she was at a museum and noticed it was numb. Sometimes L foot numbness and toe numbness of 2nd and 3rd toe causing tripping. This caused her to trip July 4th and break five metatarsals. Some minor pain in the low pain noted. Pain noted to be 2-3/10 Pain in hip 3-4/10 of L hip.    PAIN:  Are you having pain? Yes, 3/10 pain in lateral deltoids in the shoulder.  PRECAUTIONS: None  FALLS:  Has patient fallen in last 6 months? Yes. Number of falls 2  PATIENT GOALS:to lift more than 3lb with LUE, be able to eat a meal without arm being tired.    OBJECTIVE:  DIAGNOSTIC FINDINGS:  IMPRESSION: Mild degenerative change in the cervical spine without acute fracture or traumatic subluxation.   IMPRESSION: Full width, full width tears of the supraspinatus and infraspinatus tendons with retraction to the glenoid. Grade 2/3 infraspinatus atrophy.   Moderate tendinosis of the distal subscapularis tendon with articular sided fraying of the cephalad fibers.   Moderate intra-articular long head biceps tendinosis with fraying and possible partial tearing.   Mild glenohumeral and moderate AC joint  osteoarthritis.   Degenerative superior labral tearing anteriorly and posteriorly.   PATIENT SURVEYS:  Quick Dash 50%  and FOTO 46%   COGNITION: Overall cognitive status: Within functional limits for tasks assessed                                  SENSATION: WFL   POSTURE: Slight rounded shoulders and shoulder elevation    UPPER EXTREMITY ROM:    Active ROM Right eval 9/16: recert RUE Left eval 9/16 RECERT LUE  Shoulder flexion 64 122 91 92  Shoulder extension        Shoulder abduction 95 88 52 82  Shoulder adduction        Shoulder internal rotation Unable to test at 90 61 degrees 5 40  Shoulder external rotation 18at side of body -5 degrees at 90 degrees 34 35  Elbow flexion [facility]  WFL   Elbow extension [facility]  WFL   Wrist flexion        Wrist extension        Wrist ulnar deviation        Wrist radial deviation        Wrist pronation        Wrist supination        (Blank rows = not tested)   UPPER EXTREMITY MMT:   MMT Right eval Left eval  Shoulder flexion      Shoulder extension      Shoulder abduction      Shoulder adduction      Shoulder internal rotation      Shoulder external rotation      Middle trapezius  Lower trapezius      Elbow flexion      Elbow extension      Wrist flexion      Wrist extension      Wrist ulnar deviation      Wrist radial deviation      Wrist pronation      Wrist supination      Grip strength (lbs) 30 35  (Blank rows = not tested) Unable to test strength due to pain with motion   TRUNK ROM: Trunk Flexion 65  Trunk Extension 7  Trunk R SB Limited 25%  Trunk L SB WFL  Trunk R rotation WFL  Trunk L rotation Limited 25%    Ankle ROM Seated: Motion: AROM seated Right Left  Plantarflexion 4+ 3  Dorsiflexion 4+ 4  Inversion 4 3+  Eversion 4+ 3+    SHOULDER SPECIAL TESTS: Impingement tests:  unable to test due to pain in starting position SLAP lesions:  unable to test due to pain in starting position Instability  tests:  unable to test due to pain in starting position Rotator cuff assessment:  unable to test due to pain in starting position Biceps assessment:  unable to test due to pain in starting position   LOW BACK SPECIAL TEST: SLR: negative  Scour test: negative FABER: negative Pelvic compression/distraction: negative  Slump test positive LLE  6 min walk test: 1160 ft increased left foot drag and antalgic gait pattern with foot eversion    JOINT MOBILITY TESTING:  AP: painful PA: painful Inferior: painful Distraction: reduces pain   Back mobilization hypomobile and painful to grade II mobilizations    PALPATION:  Tenderness to all joint regions of GH     TODAY'S TREATMENT: DATE: 01/18/24  Manual:  Grade II mobilizations thoracic and lumbar spine x 8 minutes  Therex: Supine: -posterior pelvic tilt 10x -thoracic rotation 10x  -nerve glide 15x each LE -single knee to chest 60 seconds each LE -figure four stretch 60 seconds each LE -GTB abduction 15x  Seated:  -mini rollout swiss ball 15x -breathing 360 x multiple reps  -GTB hamstring curl 15x -GTB PF press 15   PATIENT EDUCATION: Education details: goals, POC  Person educated: Patient Education method: Explanation, Demonstration, Tactile cues, Verbal cues, and Handouts Education comprehension: verbalized understanding, returned demonstration, verbal cues required, and tactile cues required  HOME EXERCISE PROGRAM:  Access Code: [email or web address] URL: [email or web address] Date: 10/03/2023 Prepared by: Precious Bard  Exercises - Seated Scapular Retraction  - 1 x daily - 7 x weekly - 2 sets - 10 reps - 5 hold - Neck Sidebending  - 1 x daily - 7 x weekly - 2 sets - 2 reps - 30 hold - Supine Isometric Shoulder Extension with Towel  - 1 x daily - 7 x weekly - 2 sets - 10 reps - 5 hold - Supine Isometric Shoulder Adduction with Towel Roll  - 1 x daily - 7 x weekly - 2 sets - 10 reps - 5 hold - Seated Bilateral  Shoulder Flexion Towel Slide at Table Top  - 1 x daily - 7 x weekly - 1 sets - 3 reps - 30 hold - Supine Posterior Pelvic Tilt  - 1 x daily - 7 x weekly - 2 sets - 5 reps - 30 hold - Supine LE Neural Mobilization  - 1 x daily - 7 x weekly - 2 sets - 10 reps - 5 hold - Seated Ankle Alphabet  - 1 x daily -  7 x weekly - 2 sets - 1 reps - 5 hold  ASSESSMENT:  CLINICAL IMPRESSION:  ***  Pt will continue to benefit from skilled therapy to address remaining deficits in order to improve overall QoL and return to PLOF.      OBJECTIVE IMPAIRMENTS: cardiopulmonary status limiting activity, decreased activity tolerance, decreased mobility, increased fascial restrictions, impaired perceived functional ability, increased muscle spasms, impaired flexibility, impaired UE functional use, improper body mechanics, postural dysfunction, and pain.   ACTIVITY LIMITATIONS: carrying, lifting, bed mobility, bathing, dressing, self feeding, reach over head, and caring for others  PARTICIPATION LIMITATIONS: meal prep, cleaning, laundry, personal finances, interpersonal relationship, driving, shopping, community activity, yard work, church, and caregiving for husband  PERSONAL FACTORS: Age, Fitness, Past/current experiences, Time since onset of injury/illness/exacerbation, and 3+ comorbidities: aorta type A dissection in sept 2011 with aortic valve replacement bypass of R coronary artery, AAA, anemia, anxiety, CAD, CHF, depression, GERD, Heart failure, HLD, HTN, IBS, Kidney problems, migraine, osteoarthritis, osteoporosis, RA, ocular migraines, back pain  are also affecting patient's functional outcome.   REHAB POTENTIAL: Fair    CLINICAL DECISION MAKING: Evolving/moderate complexity  EVALUATION COMPLEXITY: Moderate   GOALS: Goals reviewed with patient? Yes  SHORT TERM GOALS: Target date: 12/27/2023  Patient will be independent in home exercise program to improve strength/mobility for better functional  independence with ADLs. Baseline:7/23: HEP given 9/16: compliant 1x/week  Goal status: On going     LONG TERM GOALS: Target date: 01/24/2024    Patient will increase FOTO score to equal to or greater than  57%   to demonstrate statistically significant improvement in mobility and quality of life.  Baseline: 7/22: 46 9/16: 50% 10/7: 40% 12/10: 57% Goal status: MET  2.  Patient will improve shoulder AROM to > 140 degrees of flexion, scaption, and abduction for improved ability to perform overhead activities. Baseline: 7/22: see above 9/16: see above 10/7 : defer to exacerbation 12/10: shoulder abduction >140, flexion < 140 , assess scaption next visit  Goal status: Partially Met   3.  Patient will demonstrate adequate shoulder ROM and strength to be able to shave and dress independently with pain less than 3/10. Baseline: 7/23: 6/10 pain with dressing, limited ROM, unable to test strength 9/16: 3/10 pain with dressing  Goal status: MET  4.  Patient will decrease Quick DASH score by > 8 points demonstrating reduced self-reported upper extremity disability for L and R shoulder  Baseline: 7/23: L shoulder 50% 9/16: R=54.54 % L 66% L 81% R 24% 12/10: R: 43.2% L: 56.8%  Goal status: IN PROGRESS  5.  Patient will be able to eat an entire meal without resting arm demonstrating improved functional strength Baseline: 7/23: has to rest while eating due to pain. 9/16: able to make halfway through meal 10/7: can make it about halfway through meal, some meals can get 3/4s through meal 12/10: improved, still has to rest every once in a while  Goal status: Partially Met  6.  Patient will be able to stand 15 minutes without her LLE going numb allowing for ADLS such as showering and iADLs such as cooking Baseline: 5 minutes 12/10: will assess next visit  Goal status: IN PROGRESS  7. Patient will increase six minute walk test distance to >1500 for progression to community ambulator and improve gait  ability Baseline: 1160 ft  12/10: 1010 ft  Goal status: IN PROGRESS  PLAN:  PT FREQUENCY: 2x/week  PT DURATION: 8 weeks  PLANNED INTERVENTIONS: Therapeutic  exercises, Therapeutic activity, Neuromuscular re-education, Balance training, Gait training, Patient/Family education, Self Care, Joint mobilization, Vestibular training, Canalith repositioning, Visual/preceptual remediation/compensation, Dry Needling, Electrical stimulation, Spinal mobilization, Cryotherapy, Moist heat, Compression bandaging, scar mobilization, Splintting, Taping, Traction, Ultrasound, Biofeedback, Ionotophoresis 4mg /ml Dexamethasone, Manual therapy, and Re-evaluation  PLAN FOR NEXT SESSION:   Continue with LUE strengthening  L ankle strengthening, distraction, core activation, reassess scaption and flexion ROM, make HEP     Precious Bard PT  Physical Therapist - [facility]  [facility]  01/18/24, 4:07 PM

## 2024-01-18 NOTE — Assessment & Plan Note (Signed)
Dexa 07/2023  Several falls  Toe fractures noted  Pending dental work before she can start treatment  GI intol to alendronate May be candidate for reclast or prolia  Given info to read about both  Discussed fall prevention, supplements and exercise for bone density

## 2024-01-19 ENCOUNTER — Ambulatory Visit: Payer: Medicare HMO | Admitting: General Practice

## 2024-01-19 ENCOUNTER — Ambulatory Visit: Payer: Medicare HMO

## 2024-01-19 ENCOUNTER — Encounter: Payer: Self-pay | Admitting: Gastroenterology

## 2024-01-19 ENCOUNTER — Other Ambulatory Visit: Payer: Self-pay

## 2024-01-19 ENCOUNTER — Encounter
Admission: RE | Disposition: A | Discharge status: Home / Self Care | Payer: Self-pay | Source: Ambulatory Visit | Attending: Gastroenterology

## 2024-01-19 ENCOUNTER — Ambulatory Visit
Admission: RE | Admit: 2024-01-19 | Discharge: 2024-01-19 | Disposition: A | Discharge status: Home / Self Care | Payer: Medicare HMO | Source: Ambulatory Visit | Attending: Gastroenterology | Admitting: Gastroenterology

## 2024-01-19 DIAGNOSIS — K635 Polyp of colon: Secondary | ICD-10-CM | POA: Diagnosis not present

## 2024-01-19 DIAGNOSIS — I110 Hypertensive heart disease with heart failure: Secondary | ICD-10-CM | POA: Diagnosis not present

## 2024-01-19 DIAGNOSIS — Z951 Presence of aortocoronary bypass graft: Secondary | ICD-10-CM | POA: Diagnosis not present

## 2024-01-19 DIAGNOSIS — I2510 Atherosclerotic heart disease of native coronary artery without angina pectoris: Secondary | ICD-10-CM | POA: Insufficient documentation

## 2024-01-19 DIAGNOSIS — G4733 Obstructive sleep apnea (adult) (pediatric): Secondary | ICD-10-CM | POA: Insufficient documentation

## 2024-01-19 DIAGNOSIS — I5042 Chronic combined systolic (congestive) and diastolic (congestive) heart failure: Secondary | ICD-10-CM | POA: Diagnosis not present

## 2024-01-19 DIAGNOSIS — D122 Benign neoplasm of ascending colon: Secondary | ICD-10-CM | POA: Insufficient documentation

## 2024-01-19 DIAGNOSIS — Z1211 Encounter for screening for malignant neoplasm of colon: Secondary | ICD-10-CM

## 2024-01-19 DIAGNOSIS — I480 Paroxysmal atrial fibrillation: Secondary | ICD-10-CM | POA: Diagnosis not present

## 2024-01-19 DIAGNOSIS — K219 Gastro-esophageal reflux disease without esophagitis: Secondary | ICD-10-CM | POA: Insufficient documentation

## 2024-01-19 DIAGNOSIS — Z8379 Family history of other diseases of the digestive system: Secondary | ICD-10-CM | POA: Insufficient documentation

## 2024-01-19 DIAGNOSIS — D126 Benign neoplasm of colon, unspecified: Secondary | ICD-10-CM

## 2024-01-19 DIAGNOSIS — Z952 Presence of prosthetic heart valve: Secondary | ICD-10-CM | POA: Insufficient documentation

## 2024-01-19 DIAGNOSIS — J45909 Unspecified asthma, uncomplicated: Secondary | ICD-10-CM | POA: Diagnosis not present

## 2024-01-19 DIAGNOSIS — K5730 Diverticulosis of large intestine without perforation or abscess without bleeding: Secondary | ICD-10-CM | POA: Diagnosis not present

## 2024-01-19 DIAGNOSIS — Z87891 Personal history of nicotine dependence: Secondary | ICD-10-CM | POA: Insufficient documentation

## 2024-01-19 DIAGNOSIS — K589 Irritable bowel syndrome without diarrhea: Secondary | ICD-10-CM | POA: Insufficient documentation

## 2024-01-19 DIAGNOSIS — Z7901 Long term (current) use of anticoagulants: Secondary | ICD-10-CM | POA: Diagnosis not present

## 2024-01-19 HISTORY — PX: COLONOSCOPY WITH PROPOFOL: SHX5780

## 2024-01-19 HISTORY — PX: POLYPECTOMY: SHX5525

## 2024-01-19 SURGERY — COLONOSCOPY WITH PROPOFOL
Anesthesia: General

## 2024-01-19 MED ORDER — LIDOCAINE HCL (PF) 1 % IJ SOLN
INTRAMUSCULAR | Status: DC | PRN
  Administered 2024-01-19: 40 mg

## 2024-01-19 MED ORDER — PROPOFOL 10 MG/ML IV BOLUS
INTRAVENOUS | Status: DC | PRN
  Administered 2024-01-19 (×2): 20 mg via INTRAVENOUS
  Administered 2024-01-19: 40 mg via INTRAVENOUS
  Administered 2024-01-19 (×4): 20 mg via INTRAVENOUS
  Administered 2024-01-19: 40 mg via INTRAVENOUS
  Administered 2024-01-19 (×2): 20 mg via INTRAVENOUS

## 2024-01-19 MED ORDER — SODIUM CHLORIDE 0.9 % IV SOLN: INTRAVENOUS | Status: DC

## 2024-01-19 NOTE — Anesthesia Preprocedure Evaluation (Signed)
Anesthesia Evaluation  Patient identified by MRN, date of birth, ID band Patient awake    Reviewed: Allergy & Precautions, H&P , NPO status , reviewed documented beta blocker date and time   Airway Mallampati: II  TM Distance: >3 FB Neck ROM: limited    Dental  (+) Teeth Intact   Pulmonary asthma , sleep apnea , pneumonia, former smoker   Pulmonary exam normal        Cardiovascular hypertension, + CAD and +CHF  Normal cardiovascular exam  07/2019 ECHO  1. The left ventricle has mildly reduced systolic function, with an  ejection fraction of 45-50%. The cavity size was mildly dilated. Left  ventricular diastolic Doppler parameters are consistent with  pseudonormalization. Left ventricular diffuse  hypokinesis.  2. The right ventricle has normal systolic function. The cavity was  normal. There is no increase in right ventricular wall thickness. Right  ventricular systolic pressure is mildly elevated with an estimated  pressure of 31.3 mmHg.  3. Left atrial size was mildly dilated.  4. A bioprosthesis valve is present in the aortic position. Procedure  Date: 08/2010 Normal aortic valve prosthesis. Mean gradient 5.5 mm Hg   Last visit w cardiologist 2 weeks ago, stable   Neuro/Psych  Headaches PSYCHIATRIC DISORDERS Anxiety Depression       GI/Hepatic ,GERD  Medicated,,  Endo/Other    Renal/GU      Musculoskeletal   Abdominal   Peds  Hematology  (+) Blood dyscrasia, anemia   Anesthesia Other Findings Past Medical History: No date: AAA (abdominal aortic aneurysm) (HCC)     Comment:  a. Chronic w/o evidence of aneurysmal dil on CTA 01/2016. No date: Alcohol abuse No date: Allergy No date: Anemia No date: Anxiety No date: CAD (coronary artery disease)     Comment:  a. 08/2010 s/p CABG x 1 (VG->RCA) @ time of Ao dissection              repair; b. 01/2016 Lexiscan MV: mid antsept/apical defect               w/ ?  peri-infarct ischemia-->likely attenuation-->Med Rx. No date: Chewing difficulty No date: Chronic combined systolic (congestive) and diastolic  (congestive) heart failure (HCC)     Comment:  a. 2012 EF 30-35%; b. 04/2015 EF 40-45%; c. 01/2016 Echo:               Ef 55-65%, nl AoV bioprosthesis, nl RV, nl PASP. No date: Constipation No date: Depression No date: Edema of both lower extremities No date: Gallbladder sludge No date: GERD (gastroesophageal reflux disease) No date: Heart failure (HCC) No date: Heart valve problem No date: History of Bicuspid Aortic Valve     Comment:  a. 08/2010 s/p AVR @ time of Ao dissection repair; b.               01/2016 Echo: Ef 55-65%, nl AoV bioprosthesis, nl RV, nl               PASP. No date: Hx of repair of dissecting thoracic aortic aneurysm,  Stanford type A     Comment:  a. 08/2010 s/p repair with AVR and VG->RCA; b. 01/2016               CTA: stable appearance of Asc Thoracic Aortic graft.               Opacification of flase lumen of chronic abd Ao dissection  w/ retrograde flow through lumbar arteries. No aneurysmal              dil of flase lumen. No date: Hyperlipidemia No date: Hypertensive heart disease No date: IBS (irritable bowel syndrome) No date: Internal hemorrhoids No date: Kidney problem No date: Marfan syndrome     Comment:  pt denies No date: Migraine No date: Obstructive sleep apnea No date: Osteoarthritis No date: Osteoporosis No date: Palpitations No date: Pneumonia No date: RA (rheumatoid arthritis) (HCC)     Comment:  a. Followed by Dr. Mallie Mussel  Past Surgical History: No date: ABDOMINAL AORTIC ANEURYSM REPAIR 2001: CARDIAC CATHETERIZATION 1986 & 1991: CESAREAN SECTION No date: CHOLECYSTECTOMY No date: COLONOSCOPY No date: FRACTURE SURGERY; Right     Comment:  shoulder replacement No date: HEMORRHOID BANDING No date: ORIF DISTAL RADIUS FRACTURE; Left No date: UPPER GASTROINTESTINAL ENDOSCOPY      Reproductive/Obstetrics                             Anesthesia Physical Anesthesia Plan  ASA: 3  Anesthesia Plan: General   Post-op Pain Management: Minimal or no pain anticipated   Induction: Intravenous  PONV Risk Score and Plan: 3 and Propofol infusion, TIVA and Ondansetron  Airway Management Planned: Nasal Cannula  Additional Equipment: None  Intra-op Plan:   Post-operative Plan:   Informed Consent: I have reviewed the patients History and Physical, chart, labs and discussed the procedure including the risks, benefits and alternatives for the proposed anesthesia with the patient or authorized representative who has indicated his/her understanding and acceptance.     Dental advisory given  Plan Discussed with: CRNA and Surgeon  Anesthesia Plan Comments: (Discussed risks of anesthesia with patient, including possibility of difficulty with spontaneous ventilation under anesthesia necessitating airway intervention, PONV, and rare risks such as cardiac or respiratory or neurological events, and allergic reactions. Discussed the role of CRNA in patient's perioperative care. Patient understands.)       Anesthesia Quick Evaluation

## 2024-01-19 NOTE — Op Note (Signed)
[facility] Patient Name: Rachel Torres Procedure Date: 01/19/2024 11:28 AM MRN: [id number] Account #: [id number] Date of Birth: [birth year] Admit Type: Outpatient Age: 68 Room: [facility] ENDO ROOM 2 Gender: Female Note Status: Finalized Instrument Name: Nelda Marseille [id number] Procedure:             Colonoscopy Indications:           Screening for colorectal malignant neoplasm Providers:             Wyline Mood MD, MD Referring MD:          Audrie Gallus. Tower (Referring MD) Medicines:             Monitored Anesthesia Care Complications:         No immediate complications. Procedure:             Pre-Anesthesia Assessment:                        - Prior to the procedure, a History and Physical was                         performed, and patient medications, allergies and                         sensitivities were reviewed. The patient's tolerance                         of previous anesthesia was reviewed.                        - The risks and benefits of the procedure and the                         sedation options and risks were discussed with the                         patient. All questions were answered and informed                         consent was obtained.                        - ASA Grade Assessment: II - A patient with mild                         systemic disease.                        After obtaining informed consent, the colonoscope was                         passed under direct vision. Throughout the procedure,                         the patient's blood pressure, pulse, and oxygen                         saturations were monitored continuously. The                         Colonoscope was introduced  through the anus and                         advanced to the the cecum, identified by the                         appendiceal orifice. The patient tolerated the                         procedure well. The quality of the bowel preparation                          was excellent. The ileocecal valve, appendiceal                         orifice, and rectum were photographed. The colonoscopy                         was technically difficult and complex due to                         significant looping. Findings:      The perianal and digital rectal examinations were normal.      A 3 mm polyp was found in the ascending colon. The polyp was sessile.       The polyp was removed with a jumbo cold forceps. Resection and retrieval       were complete.      Multiple small-mouthed diverticula were found in the sigmoid colon.      The exam was otherwise without abnormality on direct and retroflexion       views. Impression:            - One 3 mm polyp in the ascending colon, removed with                         a jumbo cold forceps. Resected and retrieved.                        - Diverticulosis in the sigmoid colon.                        - The examination was otherwise normal on direct and                         retroflexion views. Recommendation:        - Discharge patient to home (with escort).                        - Resume previous diet.                        - Continue present medications.                        - Await pathology results.                        - Repeat colonoscopy for surveillance based on  pathology results. Procedure Code(s):     --- Professional ---                        [phone], Colonoscopy, flexible; with biopsy, single or                         multiple Diagnosis Code(s):     --- Professional ---                        Z12.11, Encounter for screening for malignant neoplasm                         of colon                        D12.2, Benign neoplasm of ascending colon                        K57.30, Diverticulosis of large intestine without                         perforation or abscess without bleeding CPT copyright 2022 American Medical Association. All rights reserved. The  codes documented in this report are preliminary and upon coder review may  be revised to meet current compliance requirements. Wyline Mood, MD Wyline Mood MD, MD 01/19/2024 11:53:27 AM This report has been signed electronically. Number of Addenda: 0 Note Initiated On: 01/19/2024 11:28 AM Scope Withdrawal Time: 0 hours 9 minutes 13 seconds  Total Procedure Duration: 0 hours 17 minutes 50 seconds  Estimated Blood Loss:  Estimated blood loss: none.      [facility]

## 2024-01-19 NOTE — Anesthesia Postprocedure Evaluation (Signed)
Anesthesia Post Note  Patient: Rachel Torres  Procedure(s) Performed: COLONOSCOPY WITH PROPOFOL POLYPECTOMY  Patient location during evaluation: PACU Anesthesia Type: General Level of consciousness: awake and alert Pain management: pain level controlled Vital Signs Assessment: post-procedure vital signs reviewed and stable Respiratory status: spontaneous breathing, nonlabored ventilation, respiratory function stable and patient connected to nasal cannula oxygen Cardiovascular status: blood pressure returned to baseline and stable Postop Assessment: no apparent nausea or vomiting Anesthetic complications: no  No notable events documented.   Last Vitals:  Vitals:   01/19/24 1210 01/19/24 1216  BP: 128/72 117/76  Pulse: 70 72  Resp: 18 12  Temp:    SpO2: 94% 95%    Last Pain:  Vitals:   01/19/24 1210  TempSrc:   PainSc: 0-No pain                 Stephanie Coup

## 2024-01-19 NOTE — Transfer of Care (Signed)
Immediate Anesthesia Transfer of Care Note  Patient: Rachel Torres  Procedure(s) Performed: COLONOSCOPY WITH PROPOFOL POLYPECTOMY  Patient Location: PACU and Endoscopy Unit  Anesthesia Type:MAC  Level of Consciousness: drowsy  Airway & Oxygen Therapy: Patient Spontanous Breathing and Patient connected to nasal cannula oxygen  Post-op Assessment: Report given to RN and Post -op Vital signs reviewed and stable  Post vital signs: Reviewed and stable  Last Vitals:  Vitals Value Taken Time  BP 110/70 01/19/24 1159  Temp 36 C 01/19/24 1156  Pulse 78 01/19/24 1201  Resp 15 01/19/24 1201  SpO2 95 % 01/19/24 1201  Vitals shown include unfiled device data.  Last Pain:  Vitals:   01/19/24 1156  TempSrc: Temporal  PainSc: 0-No pain         Complications: No notable events documented.

## 2024-01-19 NOTE — H&P (Signed)
Wyline Mood, MD [address], [place], [place], [address] [address], [place], [place], [address] Phone: [phone]  Fax: [phone]  Primary Care Physician:  Tower, Audrie Gallus, MD   Pre-Procedure History & Physical: HPI:  Rachel Torres is a 68 y.o. female is here for an colonoscopy.   Past Medical History:  Diagnosis Date   AAA (abdominal aortic aneurysm) (HCC)    a. Chronic w/o evidence of aneurysmal dil on CTA 01/2016.   Alcohol abuse    Allergy    Anemia    Anxiety    Asthma    CAD (coronary artery disease)    a. 08/2010 s/p CABG x 1 (VG->RCA) @ time of Ao dissection repair; b. 01/2016 Lexiscan MV: mid antsept/apical defect w/ ? peri-infarct ischemia-->likely attenuation-->Med Rx.   Chewing difficulty    Chronic combined systolic (congestive) and diastolic (congestive) heart failure (HCC)    a. 2012 EF 30-35%; b. 04/2015 EF 40-45%; c. 01/2016 Echo: Ef 55-65%, nl AoV bioprosthesis, nl RV, nl PASP.   Constipation    Depression    Edema of both lower extremities    Gallbladder sludge    GERD (gastroesophageal reflux disease)    Heart failure (HCC)    Heart valve problem    History of Bicuspid Aortic Valve    a. 08/2010 s/p AVR @ time of Ao dissection repair; b. 01/2016 Echo: Ef 55-65%, nl AoV bioprosthesis, nl RV, nl PASP.   Hx of repair of dissecting thoracic aortic aneurysm, Stanford type A    a. 08/2010 s/p repair with AVR and VG->RCA; b. 01/2016 CTA: stable appearance of Asc Thoracic Aortic graft. Opacification of flase lumen of chronic abd Ao dissection w/ retrograde flow through lumbar arteries. No aneurysmal dil of flase lumen.   Hyperlipidemia    Hypertension    Hypertensive heart disease    IBS (irritable bowel syndrome)    Internal hemorrhoids    Kidney problem    Marfan syndrome    pt denies   Migraine    Obstructive sleep apnea    Osteoarthritis    Osteoporosis    Palpitations    Pneumonia    RA (rheumatoid arthritis) (HCC)     a. Followed by Dr. Mallie Mussel    Past Surgical History:  Procedure Laterality Date   ABDOMINAL AORTIC ANEURYSM REPAIR     CARDIAC CATHETERIZATION  2001   CESAREAN SECTION  1986 & 1991   CHOLECYSTECTOMY     COLONOSCOPY     ESOPHAGOGASTRODUODENOSCOPY (EGD) WITH PROPOFOL N/A 05/08/2020   Procedure: ESOPHAGOGASTRODUODENOSCOPY (EGD) WITH PROPOFOL;  Surgeon: Pasty Spillers, MD;  Location: [facility] ENDOSCOPY;  Service: Endoscopy;  Laterality: N/A;   FRACTURE SURGERY Right    shoulder replacement   HEMORRHOID BANDING     ORIF DISTAL RADIUS FRACTURE Left    UPPER GASTROINTESTINAL ENDOSCOPY      Prior to Admission medications   Medication Sig Start Date End Date Taking? Authorizing Provider  acetaminophen (TYLENOL) 500 MG tablet Take 500 mg by mouth every 6 (six) hours as needed.    [provider]  albuterol (VENTOLIN HFA) 108 (90 Base) MCG/ACT inhaler Inhale 1-2 puffs into the lungs every 6 (six) hours as needed for wheezing or shortness of breath. 11/22/23   Tower, Audrie Gallus, MD  ALPRAZolam Prudy Feeler) 0.5 MG tablet Take 1 tablet (0.5 mg total) by mouth daily as needed for anxiety. 06/03/23   Tower, Audrie Gallus, MD  aspirin EC 81 MG  tablet Take 81 mg by mouth at bedtime.    [provider]  atorvastatin (LIPITOR) 20 MG tablet Take 20 mg by mouth. Four days per week    [provider]  Calcium Carbonate (CALCIUM 600 PO) Take 600 mg by mouth.    [provider]  carvedilol (COREG) 12.5 MG tablet Take 1 tablet (12.5 mg total) by mouth 2 (two) times daily. 01/04/24   Charlsie Quest, NP  cetirizine (ZYRTEC) 10 MG tablet Take 1 tablet (10 mg total) by mouth daily as needed for allergies. 06/03/23   Tower, Audrie Gallus, MD  Cholecalciferol (VITAMIN D3) 50 MCG (2000 UT) CAPS Take 2,000 capsules by mouth. FOUR TIMES A WEEK    [provider]  cyclobenzaprine (FLEXERIL) 10 MG tablet Take 0.5-1 tablets (5-10 mg total) by mouth 3 (three) times daily as needed. For headache 03/17/22    Tower, Audrie Gallus, MD  dextromethorphan-guaiFENesin [facility] DM) 30-600 MG 12hr tablet Take 1 tablet by mouth 2 (two) times daily.    [provider]  ELIQUIS 5 MG TABS tablet Take 1 tablet (5 mg total) by mouth 2 (two) times daily. 06/17/23   Antonieta Iba, MD  ezetimibe (ZETIA) 10 MG tablet TAKE 1 TABLET EVERY DAY 09/01/23   Charlsie Quest, NP  fluticasone (FLONASE) 50 MCG/ACT nasal spray Place 2 sprays into both nostrils daily as needed for allergies or rhinitis. 06/03/23   Tower, Audrie Gallus, MD  furosemide (LASIX) 20 MG tablet Take 2 tablets (40 mg total) by mouth daily. 09/06/23   Antonieta Iba, MD  isosorbide mononitrate (IMDUR) 30 MG 24 hr tablet TAKE 1/2 TABLET EVERY DAY 09/01/23   Charlsie Quest, NP  lisinopril (ZESTRIL) 5 MG tablet Take 0.5 tablets (2.5 mg total) by mouth daily. 07/21/23   Tower, Audrie Gallus, MD  magnesium gluconate (MAGONATE) 500 MG tablet Take 500 mg by mouth at bedtime.    [provider]  nitroGLYCERIN (NITROSTAT) 0.4 MG SL tablet Place 1 tablet (0.4 mg total) under the tongue every 5 (five) minutes as needed for chest pain. 06/07/22   Antonieta Iba, MD  omeprazole (PRILOSEC) 40 MG capsule TAKE 1 CAPSULE EVERY DAY 12/08/23   Tower, Idamae Schuller A, MD  polyethylene glycol powder (GLYCOLAX/MIRALAX) 17 GM/SCOOP powder Take 17 g by mouth as needed. 06/24/21   [name] D, MD  potassium chloride (KLOR-CON M) 10 MEQ tablet Take 1 tablet (10 mEq total) by mouth 2 (two) times daily. 07/07/23   Antonieta Iba, MD  promethazine (PHENERGAN) 25 MG tablet Take 0.5 tablets (12.5 mg total) by mouth every 8 (eight) hours as needed for nausea. 03/17/22   Tower, Audrie Gallus, MD  TART CHERRY PO Take 1 capsule by mouth 2 (two) times daily.    [provider]  topiramate (TOPAMAX) 50 MG tablet Take 1 tablet (50 mg total) by mouth at bedtime. 11/14/23   Worthy Rancher, MD  venlafaxine XR (EFFEXOR-XR) 150 MG 24 hr capsule TAKE 1 CAPSULE EVERY DAY WITH BREAKFAST 06/09/23    Tower, Audrie Gallus, MD  venlafaxine XR (EFFEXOR-XR) 37.5 MG 24 hr capsule Take 1 capsule (37.5 mg total) by mouth daily with breakfast. 06/09/23   [name], MD    Allergies as of 12/29/2023 - Review Complete 12/29/2023  Allergen Reaction Noted   Alendronate sodium Other (See Comments) 04/29/2017   Ginzing [ginseng] Other (See Comments) 10/06/2016   Hydrocod poli-chlorphe poli er Itching 09/20/2011   Hydrocodone-acetaminophen Itching    Imitrex [sumatriptan]  04/29/2017   Oxycodone Itching 04/18/2015   Peanut-containing drug products Other (See Comments)    Tramadol Other (See Comments) 09/07/2017   Hydrocodone-guaifenesin Itching 06/07/2013   Iodine Itching 09/20/2011   Prednisone Rash 07/26/2007   Tape Itching 09/20/2011    Family History  Problem Relation Age of Onset   Hypertension Mother    Depression Mother    Osteoporosis Mother    Stroke Mother    Irritable bowel syndrome Mother    Asthma Mother    Heart disease Mother    Thyroid disease Mother    Anxiety disorder Mother    Other Father 62   Arthritis Sister    Diabetes Sister    Heart disease Sister    Asthma Sister    Migraines Sister    Diabetes Sister    Nephrolithiasis Sister    Osteoporosis Sister    Arthritis Sister    Asthma Sister    Migraines Sister    Stroke Maternal Grandmother    Colon cancer Maternal Grandmother    Lymphoma Maternal Grandmother    Migraines Maternal Grandmother    Lung cancer Maternal Grandfather    Melanoma Maternal Grandfather    Aneurysm Paternal Grandmother        brain   Diabetes Paternal Grandmother    Arthritis Paternal Grandmother    Arthritis Paternal Grandfather    Diabetes Paternal Grandfather    Diabetes Brother    Other Brother        prediabeties   Asthma Brother    Migraines Brother    Breast cancer Neg Hx     Social History   Socioeconomic History   Marital status: Married    Spouse name: [name]   Number of children: 2   Years of education: Not  on file   Highest education level: Some college, no degree  Occupational History   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    Employer: [employer]  Tobacco Use   Smoking status: Former    Current packs/day: 0.00    Average packs/day: 0.8 packs/day for 1.5 years (1.1 ttl pk-yrs)    Types: Cigarettes    Start date: 06/20/1977    Quit date: 12/20/1978    Years since quitting: 45.1   Smokeless tobacco: Never  Vaping Use   Vaping status: Never Used  Substance and Sexual Activity   Alcohol use: Yes    Comment: 2-3 glasses of red wine per month   Drug use: No   Sexual activity: Never  Other Topics Concern   Not on file  Social History Narrative    married for the second time, happily   On disability after dissection of aortic aneurysm   Husband has Parkinson's - deteriorating w/ dementia   4-6 caffeinated beverages daily   01/25/2018         Social Drivers of Health   Financial Resource Strain: Low Risk  (12/29/2023)   Overall Financial Resource Strain (CARDIA)    Difficulty of Paying Living Expenses: Not hard at all  Food Insecurity: No Food Insecurity (12/29/2023)   Hunger Vital Sign    Worried About Running Out of Food in the Last Year: Never true    Ran Out of Food in the Last Year: Never true  Transportation Needs: No Transportation Needs (12/29/2023)   PRAPARE - Administrator, Civil Service (Medical): No    Lack of Transportation (Non-Medical): No  Physical Activity: Inactive (12/29/2023)   Exercise Vital Sign    Days of Exercise per  Week: 0 days    Minutes of Exercise per Session: 0 min  Stress: Stress Concern Present (12/29/2023)   [employer] of Occupational Health - Occupational Stress Questionnaire    Feeling of Stress : Very much  Social Connections: Moderately Integrated (12/29/2023)   Social Connection and Isolation Panel [NHANES]    Frequency of Communication with Friends and Family: More than three times a week    Frequency of Social Gatherings with Friends and  Family: Once a week    Attends Religious Services: More than 4 times per year    Active Member of [employer] or Organizations: No    Attends Banker Meetings: Never    Marital Status: Married  Catering manager Violence: Not At Risk (12/29/2023)   Humiliation, Afraid, Rape, and Kick questionnaire    Fear of Current or Ex-Partner: No    Emotionally Abused: No    Physically Abused: No    Sexually Abused: No    Review of Systems: See HPI, otherwise negative ROS  Physical Exam: There were no vitals taken for this visit. General:   Alert,  pleasant and cooperative in NAD Head:  Normocephalic and atraumatic. Neck:  Supple; no masses or thyromegaly. Lungs:  Clear throughout to auscultation, normal respiratory effort.    Heart:  +S1, +S2, Regular rate and rhythm, No edema. Abdomen:  Soft, nontender and nondistended. Normal bowel sounds, without guarding, and without rebound.   Neurologic:  Alert and  oriented x4;  grossly normal neurologically.  Impression/Plan: Dajanay C Surratt is here for an colonoscopy to be performed for Screening colonoscopy average risk   Risks, benefits, limitations, and alternatives regarding  colonoscopy have been reviewed with the patient.  Questions have been answered.  All parties agreeable.   Wyline Mood, MD  01/19/2024, 11:05 AM

## 2024-01-20 LAB — SURGICAL PATHOLOGY

## 2024-01-23 ENCOUNTER — Encounter: Payer: Self-pay | Admitting: Gastroenterology

## 2024-01-24 ENCOUNTER — Ambulatory Visit: Payer: Medicare HMO

## 2024-01-24 ENCOUNTER — Ambulatory Visit: Payer: Medicare HMO | Admitting: Psychology

## 2024-01-24 DIAGNOSIS — F331 Major depressive disorder, recurrent, moderate: Secondary | ICD-10-CM

## 2024-01-24 NOTE — Progress Notes (Signed)
 [facility] Counselor/Therapist Progress Note  Patient ID: JENAY MORICI, MRN: [id number],    Date: 01/24/2024  Time Spent: 10:00am-10:45am  45 minutes   Treatment Type: Individual Therapy  Reported Symptoms: stress  Mental Status Exam: Appearance:  Casual     Behavior: Appropriate  Motor: Normal  Speech/Language:  Normal Rate  Affect: Appropriate  Mood: normal  Thought process: normal  Thought content:   WNL  Sensory/Perceptual disturbances:   WNL  Orientation: oriented to person, place, time/date, and situation  Attention: Good  Concentration: Good  Memory: WNL  Fund of knowledge:  Good  Insight:   Good  Judgment:  Good  Impulse Control: Good   Risk Assessment: Danger to Self:  No Self-injurious Behavior: No Danger to Others: No Duty to Warn:no Physical Aggression / Violence:No  Access to Firearms a concern: No  Gang Involvement:No   Subjective: Pt present for face-to-face individual therapy via video.  Pt consents to telehealth video session and is aware of limitations and benefits of virtual sessions.  Location of pt: home Location of therapist: home office.  Pt talked about spending 10 hours in the ER yesterday for [name] bc she had trouble waking him up.   [name] is ok but they found out he has a broken clavical.   Pt states [name] has been increasingly more difficult to manage.  [name] fell 4 times last week.  His memory is declining.   [name] does not listen to pt and she gets frustrated.  Pt is feeling very overwhelmed and disorganized.     Pt continues to have back pain.   She can only work on things for a few minutes at a time and then has to rest.  Pt had a colonoscopy and the results were good.  She also had her annual physical and her blood work was ok.  Pt does have a deficit in her left leg bc of nerve issues.  Pt is seeing a doctor next week for that.   Pt talked about her sister who has cancer.  Her sister will have surgery next week.    Addressed pt's concerns about her sister.   Worked on self care strategies.  Provided supportive therapy.  Interventions: Cognitive Behavioral Therapy and Insight-Oriented  Diagnosis:  F33.1  Plan of Care: Recommend ongoing therapy.   Pt participated in setting treatment goals.   Pt needs a place to talk about stressors.  Plan to meet every two weeks.    Treatment Plan (target date: 03/23/2024) Client Abilities/Strengths  Pt is bright, engaging, and motivated for therapy.   Client Treatment Preferences  Individual therapy.  Client Statement of Needs  Improve coping skills.  Have a place to talk about stressors.   Symptoms  Depressed or irritable mood. Unresolved grief issues.  Problems Addressed  Unipolar Depression Goals 1. Alleviate depressive symptoms and return to previous level of effective functioning. 2. Appropriately grieve the loss in order to normalize mood and to return to previously adaptive level of functioning. Objective Learn and implement behavioral strategies to overcome depression. Target Date: 2024-03-23 Frequency: Biweekly  Progress: 40 Modality: individual  Related Interventions Engage the client in behavioral activation, increasing his/her activity level and contact with sources of reward, while identifying processes that inhibit activation.  Use behavioral techniques such as instruction, rehearsal, role-playing, role reversal, as needed, to facilitate activity in the client's daily life; reinforce success. Assist the client in developing skills that increase the likelihood of deriving pleasure from  behavioral activation (e.g., assertiveness skills, developing an exercise plan, less internal/more external focus, increased social involvement); reinforce success. Objective Identify important people in life, past and present, and describe the quality, good and poor, of those relationships. Target Date: 2024-03-23 Frequency: Biweekly  Progress: 40 Modality:  individual  Related Interventions Conduct Interpersonal Therapy beginning with the assessment of the client's interpersonal inventory of important past and present relationships; develop a case formulation linking depression to grief, interpersonal role disputes, role transitions, and/or interpersonal deficits). Objective Learn and implement problem-solving and decision-making skills. Target Date: 2024-03-23 Frequency: Biweekly  Progress: 40 Modality: individual  Related Interventions Conduct Problem-Solving Therapy using techniques such as psychoeducation, modeling, and role-playing to teach client problem-solving skills (i.e., defining a problem specifically, generating possible solutions, evaluating the pros and cons of each solution, selecting and implementing a plan of action, evaluating the efficacy of the plan, accepting or revising the plan); role-play application of the problem-solving skill to a real life issue. Encourage in the client the development of a positive problem orientation in which problems and solving them are viewed as a natural part of life and not something to be feared, despaired, or avoided. 3. Develop healthy interpersonal relationships that lead to the alleviation and help prevent the relapse of depression. 4. Develop healthy thinking patterns and beliefs about self, others, and the world that lead to the alleviation and help prevent the relapse of depression. 5. Recognize, accept, and cope with feelings of depression. Diagnosis F33.1  Conditions For Discharge Achievement of treatment goals and objectives   Veva Alma, LCSW

## 2024-01-25 NOTE — Therapy (Addendum)
 OUTPATIENT PHYSICAL THERAPY TREATMENT/ RECERT   Patient Name: Rachel Torres MRN: [id number] DOB:July 25, 1956, 68 y.o., female Today's Date: 01/26/2024  END OF SESSION:  PT End of Session - 01/26/24 0855     Visit Number 19    Number of Visits 35    Date for PT Re-Evaluation 03/22/24    Authorization Type [employer]  Medicare HMO    Authorization Time Period 09/05/23-10/31/23    Progress Note Due on Visit 10    PT Start Time 0855    PT Stop Time 0929    PT Time Calculation (min) 34 min    Equipment Utilized During Treatment Gait belt    Activity Tolerance Patient tolerated treatment well    Behavior During Therapy WFL for tasks assessed/performed                          Past Medical History:  Diagnosis Date   AAA (abdominal aortic aneurysm) (HCC)    a. Chronic w/o evidence of aneurysmal dil on CTA 01/2016.   Alcohol abuse    Allergy    Anemia    Anxiety    Asthma    CAD (coronary artery disease)    a. 08/2010 s/p CABG x 1 (VG->RCA) @ time of Ao dissection repair; b. 01/2016 Lexiscan  MV: mid antsept/apical defect w/ ? peri-infarct ischemia-->likely attenuation-->Med Rx.   Chewing difficulty    Chronic combined systolic (congestive) and diastolic (congestive) heart failure (HCC)    a. 2012 EF 30-35%; b. 04/2015 EF 40-45%; c. 01/2016 Echo: Ef 55-65%, nl AoV bioprosthesis, nl RV, nl PASP.   Constipation    Depression    Edema of both lower extremities    Gallbladder sludge    GERD (gastroesophageal reflux disease)    Heart failure (HCC)    Heart valve problem    History of Bicuspid Aortic Valve    a. 08/2010 s/p AVR @ time of Ao dissection repair; b. 01/2016 Echo: Ef 55-65%, nl AoV bioprosthesis, nl RV, nl PASP.   Hx of repair of dissecting thoracic aortic aneurysm, Stanford type A    a. 08/2010 s/p repair with AVR and VG->RCA; b. 01/2016 CTA: stable appearance of Asc Thoracic Aortic graft. Opacification of flase lumen of chronic abd Ao dissection w/ retrograde flow  through lumbar arteries. No aneurysmal dil of flase lumen.   Hyperlipidemia    Hypertension    Hypertensive heart disease    IBS (irritable bowel syndrome)    Internal hemorrhoids    Kidney problem    Marfan syndrome    pt denies   Migraine    Obstructive sleep apnea    Osteoarthritis    Osteoporosis    Palpitations    Pneumonia    RA (rheumatoid arthritis) (HCC)    a. Followed by Dr. Balinda   Past Surgical History:  Procedure Laterality Date   ABDOMINAL AORTIC ANEURYSM REPAIR     CARDIAC CATHETERIZATION  2001   CESAREAN SECTION  1986 & 1991   CHOLECYSTECTOMY     COLONOSCOPY     COLONOSCOPY WITH PROPOFOL  N/A 01/19/2024   Procedure: COLONOSCOPY WITH PROPOFOL ;  Surgeon: Therisa Bi, MD;  Location: [facility] ENDOSCOPY;  Service: Gastroenterology;  Laterality: N/A;   ESOPHAGOGASTRODUODENOSCOPY (EGD) WITH PROPOFOL  N/A 05/08/2020   Procedure: ESOPHAGOGASTRODUODENOSCOPY (EGD) WITH PROPOFOL ;  Surgeon: Janalyn Keene NOVAK, MD;  Location: [facility] ENDOSCOPY;  Service: Endoscopy;  Laterality: N/A;   FRACTURE SURGERY Right    shoulder replacement   HEMORRHOID BANDING  ORIF DISTAL RADIUS FRACTURE Left    POLYPECTOMY  01/19/2024   Procedure: POLYPECTOMY;  Surgeon: Therisa Bi, MD;  Location: [facility] ENDOSCOPY;  Service: Gastroenterology;;   UPPER GASTROINTESTINAL ENDOSCOPY     Patient Active Problem List   Diagnosis Date Noted   Adenomatous polyp of colon 01/19/2024   Heart failure with improved ejection fraction (HFimpEF) (HCC) 01/18/2024   Abnormal metabolism 11/14/2023   Weight regain after inital weight loss 11/14/2023   Multiple closed tarsal fractures of left foot, sequela 07/12/2023   Arm contusion, right, sequela 07/12/2023   Lumbar disc disease 07/12/2023   PAF (paroxysmal atrial fibrillation) (HCC) 06/09/2023   Paresthesia 06/03/2023   Obesity, Beginning BMI 35.45 02/17/2023   Tinnitus 01/14/2023   Change in vision 01/14/2023   Encounter for screening colonoscopy 01/14/2023    Personal history of fall 01/14/2023   Problem related to unspecified psychosocial circumstances 12/28/2022   Person encountering health services to consult on behalf of another person 12/28/2022   Palpitation 12/10/2022   Fatigue 10/26/2022   B12 deficiency 10/26/2022   Right shoulder pain 09/15/2022   Depression 09/14/2022   Other constipation 05/12/2022   Medicare annual wellness visit, initial 07/17/2021   TMJ (dislocation of temporomandibular joint) 07/17/2021   Encounter for screening mammogram for breast cancer 06/05/2021   Diarrhea 04/11/2020   Epigastric pain 02/19/2020   Dark stools 02/19/2020   History of atrial fibrillation 02/05/2019   Shortness of breath 01/27/2019   Irregular surface of cornea 12/25/2018   History of HPV infection 12/01/2016   Screening mammogram, encounter for 11/29/2016   Estrogen deficiency 11/29/2016   Encounter for routine gynecological examination 11/29/2016   Grade III hemorrhoids 10/06/2016   Tachycardia 08/14/2016   Hemorrhoids 08/13/2016   Atypical migraine 08/03/2016   CAD (coronary artery disease)    AAA (abdominal aortic aneurysm) (HCC)    Chronic combined systolic (congestive) and diastolic (congestive) heart failure (HCC)    Prediabetes 02/11/2016   Generalized anxiety disorder 12/08/2015   Panic attacks 07/28/2015   Chest pain 07/09/2013   Sensorineural hearing loss 03/27/2013   Vitamin D  deficiency 07/03/2012   H/O aortic dissection 05/12/2012   Routine general medical examination at a health care facility 09/20/2011   Atypical chest pain 09/15/2011   S/P AVR (aortic valve replacement) 01/07/2011   CORONARY ARTERY BYPASS GRAFT, HX OF 01/07/2011   Arthritis 12/20/2010   Back pain 12/15/2010   H/O dissecting abdominal aortic aneurysm repair 08/24/2010   IRRITABLE BOWEL SYNDROME 09/09/2009   Obstructive sleep apnea 08/04/2009   External hemorrhoid 06/26/2009   Osteoporosis 01/09/2009   Hyperlipidemia 07/26/2007   Depression  with anxiety 07/26/2007   Essential hypertension 07/26/2007   Allergic rhinitis 07/26/2007   Asthma 07/26/2007   GERD 07/26/2007   Osteoarthritis 07/26/2007   Ocular migraine 07/26/2007   Mild intermittent asthma without complication 07/26/2007    PCP: Randeen Laine LABOR MD   REFERRING PROVIDER: Lamar Hamming  REFERRING DIAG: L rotator cuff   THERAPY DIAG:  Stiffness of left shoulder, not elsewhere classified  Other abnormalities of gait and mobility  Abnormal posture  Other low back pain  Left shoulder pain, unspecified chronicity  Rationale for Evaluation and Treatment: Rehabilitation  ONSET DATE: Feb 2024  SUBJECTIVE:  SUBJECTIVE STATEMENT:  Patient late to session due to caregiver coverage. Goes today at noon to see results from her electrical test.   Hand dominance: Left  PERTINENT HISTORY: Rachel Torres is a 66yoF familiar to our services. Pt referred to PT for strengthening of chronic left shoulder DJD, known remote RC injury. Recently fell and broke four metatarsals, since cleared from use of CAM rocker. She was treated successfully in past for right shoulder pain and has past medical history of Right shoulder surgery replacement. 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 PMH aorta type A dissection in sept 2011 s/p aortic valve replacement, bypass of R coronary artery, anemia, anxiety, CAD, CHF, depression, GERD, Heart failure, HLD, HTN, IBS, migraine, osteoarthritis, osteoporosis, RA, back pain.    Back pain:  Patient has new referral for radiculopathy, lumbar region. MRI showed degeneration of L1-2 and L2-3 with some disc space collapse and minor retrolisthesis of L2-3. Issue began in February, felt numbness in L thigh. No cause known, but she was at a museum and noticed it was numb.  Sometimes L foot numbness and toe numbness of 2nd and 3rd toe causing tripping. This caused her to trip July 4th and break five metatarsals. Some minor pain in the low pain noted. Pain noted to be 2-3/10 Pain in hip 3-4/10 of L hip.    PAIN:  Are you having pain? Yes, 3/10 pain in lateral deltoids in the shoulder.  PRECAUTIONS: None  FALLS:  Has patient fallen in last 6 months? Yes. Number of falls 2  PATIENT GOALS:to lift more than 3lb with LUE, be able to eat a meal without arm being tired.    OBJECTIVE:  DIAGNOSTIC FINDINGS:  IMPRESSION: Mild degenerative change in the cervical spine without acute fracture or traumatic subluxation.   IMPRESSION: Full width, full width tears of the supraspinatus and infraspinatus tendons with retraction to the glenoid. Grade 2/3 infraspinatus atrophy.   Moderate tendinosis of the distal subscapularis tendon with articular sided fraying of the cephalad fibers.   Moderate intra-articular long head biceps tendinosis with fraying and possible partial tearing.   Mild glenohumeral and moderate AC joint osteoarthritis.   Degenerative superior labral tearing anteriorly and posteriorly.   PATIENT SURVEYS:  Quick Dash 50%  and FOTO 46%   COGNITION: Overall cognitive status: Within functional limits for tasks assessed                                  SENSATION: WFL   POSTURE: Slight rounded shoulders and shoulder elevation    UPPER EXTREMITY ROM:    Active ROM Right eval 9/16: recert RUE Left eval 9/16 RECERT LUE  Shoulder flexion 64 122 91 92  Shoulder extension        Shoulder abduction 95 88 52 82  Shoulder adduction        Shoulder internal rotation Unable to test at 90 61 degrees 5 40  Shoulder external rotation 18at side of body -5 degrees at 90 degrees 34 35  Elbow flexion [facility]  WFL   Elbow extension [facility]  WFL   Wrist flexion        Wrist extension        Wrist ulnar deviation        Wrist radial deviation        Wrist  pronation        Wrist supination        (Blank rows =  not tested)   UPPER EXTREMITY MMT:   MMT Right eval Left eval  Shoulder flexion      Shoulder extension      Shoulder abduction      Shoulder adduction      Shoulder internal rotation      Shoulder external rotation      Middle trapezius      Lower trapezius      Elbow flexion      Elbow extension      Wrist flexion      Wrist extension      Wrist ulnar deviation      Wrist radial deviation      Wrist pronation      Wrist supination      Grip strength (lbs) 30 35  (Blank rows = not tested) Unable to test strength due to pain with motion   TRUNK ROM: Trunk Flexion 65  Trunk Extension 7  Trunk R SB Limited 25%  Trunk L SB WFL  Trunk R rotation WFL  Trunk L rotation Limited 25%    Ankle ROM Seated: Motion: AROM seated Right Left  Plantarflexion 4+ 3  Dorsiflexion 4+ 4  Inversion 4 3+  Eversion 4+ 3+    SHOULDER SPECIAL TESTS: Impingement tests:  unable to test due to pain in starting position SLAP lesions:  unable to test due to pain in starting position Instability tests:  unable to test due to pain in starting position Rotator cuff assessment:  unable to test due to pain in starting position Biceps assessment:  unable to test due to pain in starting position   LOW BACK SPECIAL TEST: SLR: negative  Scour test: negative FABER: negative Pelvic compression/distraction: negative  Slump test positive LLE  6 min walk test: 1160 ft increased left foot drag and antalgic gait pattern with foot eversion    JOINT MOBILITY TESTING:  AP: painful PA: painful Inferior: painful Distraction: reduces pain   Back mobilization hypomobile and painful to grade II mobilizations    PALPATION:  Tenderness to all joint regions of GH     TODAY'S TREATMENT: DATE: 01/26/24  Physical therapy treatment session today consisted of completing assessment of goals and administration of testing as demonstrated and documented  in flow sheet, treatment, and goals section of this note. Addition treatments may be found below.   Manual:  Grade II mobilizations thoracic and lumbar spine x 8 minutes  Therex: Supine: -posterior pelvic tilt 10x -thoracic rotation 10x  -nerve glide 15x each LE -single knee to chest 60 seconds each LE -figure four stretch 60 seconds each LE -GTB abduction 15x  Seated:  -mini rollout swiss ball 15x -breathing 360 x multiple reps  -GTB hamstring curl 15x -GTB PF press 15   PATIENT EDUCATION: Education details: goals, POC  Person educated: Patient Education method: Explanation, Demonstration, Tactile cues, Verbal cues, and Handouts Education comprehension: verbalized understanding, returned demonstration, verbal cues required, and tactile cues required  HOME EXERCISE PROGRAM:  Access Code: [email or web address] URL: [email or web address] Date: 10/03/2023 Prepared by: Mykel Sponaugle  Exercises - Seated Scapular Retraction  - 1 x daily - 7 x weekly - 2 sets - 10 reps - 5 hold - Neck Sidebending  - 1 x daily - 7 x weekly - 2 sets - 2 reps - 30 hold - Supine Isometric Shoulder Extension with Towel  - 1 x daily - 7 x weekly - 2 sets - 10 reps - 5 hold - Supine Isometric Shoulder Adduction with Towel  Roll  - 1 x daily - 7 x weekly - 2 sets - 10 reps - 5 hold - Seated Bilateral Shoulder Flexion Towel Slide at Table Top  - 1 x daily - 7 x weekly - 1 sets - 3 reps - 30 hold - Supine Posterior Pelvic Tilt  - 1 x daily - 7 x weekly - 2 sets - 5 reps - 30 hold - Supine LE Neural Mobilization  - 1 x daily - 7 x weekly - 2 sets - 10 reps - 5 hold - Seated Ankle Alphabet  - 1 x daily - 7 x weekly - 2 sets - 1 reps - 5 hold  ASSESSMENT:  CLINICAL IMPRESSION:  Patient is meeting her shoulder ROM goal. She does continue to have weakness that limits ADLS. Her Annitta demonstrates her improved mobility and ability to utilize her Ue's. Her back continues to be prevalent and limit her  mobility.   Pt will continue to benefit from skilled therapy to address remaining deficits in order to improve overall QoL and return to PLOF.      OBJECTIVE IMPAIRMENTS: cardiopulmonary status limiting activity, decreased activity tolerance, decreased mobility, increased fascial restrictions, impaired perceived functional ability, increased muscle spasms, impaired flexibility, impaired UE functional use, improper body mechanics, postural dysfunction, and pain.   ACTIVITY LIMITATIONS: carrying, lifting, bed mobility, bathing, dressing, self feeding, reach over head, and caring for others  PARTICIPATION LIMITATIONS: meal prep, cleaning, laundry, personal finances, interpersonal relationship, driving, shopping, community activity, yard work, church, and caregiving for husband  PERSONAL FACTORS: Age, Fitness, Past/current experiences, Time since onset of injury/illness/exacerbation, and 3+ comorbidities: aorta type A dissection in sept 2011 with aortic valve replacement bypass of R coronary artery, AAA, anemia, anxiety, CAD, CHF, depression, GERD, Heart failure, HLD, HTN, IBS, Kidney problems, migraine, osteoarthritis, osteoporosis, RA, ocular migraines, back pain  are also affecting patient's functional outcome.   REHAB POTENTIAL: Fair    CLINICAL DECISION MAKING: Evolving/moderate complexity  EVALUATION COMPLEXITY: Moderate   GOALS: Goals reviewed with patient? Yes  SHORT TERM GOALS: Target date: 12/27/2023  Patient will be independent in home exercise program to improve strength/mobility for better functional independence with ADLs. Baseline:7/23: HEP given 9/16: compliant 1x/week  Goal status: On going     LONG TERM GOALS: Target date: 03/22/2024   Patient will increase FOTO score to equal to or greater than  57%   to demonstrate statistically significant improvement in mobility and quality of life.  Baseline: 7/22: 46 9/16: 50% 10/7: 40% 12/10: 57% Goal status: MET  2.  Patient  will improve shoulder AROM to > 140 degrees of flexion, scaption, and abduction for improved ability to perform overhead activities. Baseline: 7/22: see above 9/16: see above 10/7 : defer to exacerbation 12/10: shoulder abduction >140, flexion < 140 , assess scaption next visit 2/6: flexion >140, abduction >140 scaption >140 Goal status: MET  3.  Patient will demonstrate adequate shoulder ROM and strength to be able to shave and dress independently with pain less than 3/10. Baseline: 7/23: 6/10 pain with dressing, limited ROM, unable to test strength 9/16: 3/10 pain with dressing  Goal status: MET  4.  Patient will decrease Quick DASH score by > 8 points demonstrating reduced self-reported upper extremity disability for L and R shoulder  Baseline: 7/23: L shoulder 50% 9/16: R=54.54 % L 66% L 81% R 24% 12/10: R: 43.2% L: 56.8% 2/6: L 22%  R 34%  Goal status: MET  5.  Patient will be  able to eat an entire meal without resting arm demonstrating improved functional strength Baseline: 7/23: has to rest while eating due to pain. 9/16: able to make halfway through meal 10/7: can make it about halfway through meal, some meals can get 3/4s through meal 12/10: improved, still has to rest every once in a while 2/6: can get halfway to 2/3rds of the meal.  Goal status: Partially Met  6.  Patient will be able to stand 15 minutes without her LLE going numb allowing for ADLS such as showering and iADLs such as cooking Baseline: 5 minutes 12/10: will assess next visit 2/6: 10 minutes  Goal status: IN PROGRESS  7. Patient will increase six minute walk test distance to >1500 for progression to community ambulator and improve gait ability Baseline: 1160 ft  12/10: 1010 ft 2/6: 1140 ft  Goal status: IN PROGRESS  8  Patient will reduce modified Oswestry score to <20 as to demonstrate minimal disability with ADLs including improved sleeping tolerance, walking/sitting tolerance etc for better mobility with ADLs.   Baseline: 2/6: 60% Goal status: New PLAN:  PT FREQUENCY: 2x/week  PT DURATION: 8 weeks  PLANNED INTERVENTIONS: Therapeutic exercises, Therapeutic activity, Neuromuscular re-education, Balance training, Gait training, Patient/Family education, Self Care, Joint mobilization, Vestibular training, Canalith repositioning, Visual/preceptual remediation/compensation, Dry Needling, Electrical stimulation, Spinal mobilization, Cryotherapy, Moist heat, Compression bandaging, scar mobilization, Splintting, Taping, Traction, Ultrasound, Biofeedback, Ionotophoresis 4mg /ml Dexamethasone , Manual therapy, and Re-evaluation  PLAN FOR NEXT SESSION:   Continue with LUE strengthening  L ankle strengthening, distraction, core activation, reassess scaption and flexion ROM, make HEP     Naftula Donahue  Leopoldo PT  Physical Therapist -   [facility]  01/26/24, 9:30 AM

## 2024-01-26 ENCOUNTER — Ambulatory Visit: Payer: Medicare HMO | Attending: Orthopedic Surgery

## 2024-01-26 DIAGNOSIS — M5416 Radiculopathy, lumbar region: Secondary | ICD-10-CM | POA: Diagnosis not present

## 2024-01-26 DIAGNOSIS — M5459 Other low back pain: Secondary | ICD-10-CM | POA: Insufficient documentation

## 2024-01-26 DIAGNOSIS — Z6836 Body mass index (BMI) 36.0-36.9, adult: Secondary | ICD-10-CM | POA: Diagnosis not present

## 2024-01-26 DIAGNOSIS — R2689 Other abnormalities of gait and mobility: Secondary | ICD-10-CM | POA: Diagnosis not present

## 2024-01-26 DIAGNOSIS — M79604 Pain in right leg: Secondary | ICD-10-CM | POA: Diagnosis not present

## 2024-01-26 DIAGNOSIS — R293 Abnormal posture: Secondary | ICD-10-CM | POA: Diagnosis not present

## 2024-01-26 DIAGNOSIS — M25512 Pain in left shoulder: Secondary | ICD-10-CM | POA: Insufficient documentation

## 2024-01-26 DIAGNOSIS — M25612 Stiffness of left shoulder, not elsewhere classified: Secondary | ICD-10-CM | POA: Diagnosis not present

## 2024-01-26 DIAGNOSIS — M79605 Pain in left leg: Secondary | ICD-10-CM | POA: Diagnosis not present

## 2024-01-26 NOTE — Addendum Note (Signed)
 Addended by: Kittie Perking on: 01/26/2024 09:32 AM   Modules accepted: Orders

## 2024-01-30 ENCOUNTER — Ambulatory Visit (INDEPENDENT_AMBULATORY_CARE_PROVIDER_SITE_OTHER): Payer: Medicare HMO | Admitting: General Practice

## 2024-01-30 ENCOUNTER — Encounter: Payer: Self-pay | Admitting: General Practice

## 2024-01-30 ENCOUNTER — Ambulatory Visit (INDEPENDENT_AMBULATORY_CARE_PROVIDER_SITE_OTHER)
Admission: RE | Admit: 2024-01-30 | Discharge: 2024-01-30 | Disposition: A | Discharge status: Home / Self Care | Payer: Medicare HMO | Source: Ambulatory Visit | Attending: General Practice | Admitting: General Practice

## 2024-01-30 ENCOUNTER — Ambulatory Visit: Payer: Self-pay | Admitting: Family Medicine

## 2024-01-30 ENCOUNTER — Other Ambulatory Visit: Payer: Self-pay | Admitting: Family Medicine

## 2024-01-30 VITALS — BP 120/74 | HR 101 | Temp 97.8°F | Wt 214.0 lb

## 2024-01-30 DIAGNOSIS — R062 Wheezing: Secondary | ICD-10-CM | POA: Diagnosis not present

## 2024-01-30 DIAGNOSIS — R051 Acute cough: Secondary | ICD-10-CM | POA: Diagnosis not present

## 2024-01-30 DIAGNOSIS — R059 Cough, unspecified: Secondary | ICD-10-CM | POA: Diagnosis not present

## 2024-01-30 DIAGNOSIS — Z96611 Presence of right artificial shoulder joint: Secondary | ICD-10-CM | POA: Diagnosis not present

## 2024-01-30 DIAGNOSIS — I517 Cardiomegaly: Secondary | ICD-10-CM | POA: Diagnosis not present

## 2024-01-30 LAB — POC COVID19 BINAXNOW: SARS Coronavirus 2 Ag: NEGATIVE

## 2024-01-30 LAB — POCT INFLUENZA A/B
Influenza A, POC: NEGATIVE
Influenza B, POC: NEGATIVE

## 2024-01-30 MED ORDER — METHYLPREDNISOLONE SODIUM SUCC 40 MG IJ SOLR: 40.0000 mg | Freq: Once | INTRAMUSCULAR | Status: DC

## 2024-01-30 MED ORDER — PROMETHAZINE-DM 6.25-15 MG/5ML PO SYRP: 5.0000 mL | ORAL_SOLUTION | Freq: Four times a day (QID) | ORAL | 0 refills | Status: DC | PRN

## 2024-01-30 MED ORDER — BENZONATATE 200 MG PO CAPS: 200.0000 mg | ORAL_CAPSULE | Freq: Three times a day (TID) | ORAL | 0 refills | Status: DC | PRN

## 2024-01-30 MED ORDER — METHYLPREDNISOLONE ACETATE 80 MG/ML IJ SUSP
80.0000 mg | Freq: Once | INTRAMUSCULAR | Status: AC
  Administered 2024-01-30: 40 mg via INTRAMUSCULAR

## 2024-01-30 NOTE — Patient Instructions (Addendum)
 Complete xray(s) prior to leaving today. I will notify you of your results once received.   Refill sent for tessalon  Perles, cough syrup and albuterol .  Continue mucinex .   Please go to the ER if your breathing gets worse, if you develop chest pain, shortness of breath or difficulty breathing.   Please schedule follow up with PCP if your symptoms worsen or fail to improve.   It was a pleasure meeting you!

## 2024-01-30 NOTE — Assessment & Plan Note (Addendum)
 Covid and flu negative.  Differentials include Viral URI, pnuemonia, bronchitis.   Wheezes heard on auscultation.   Given the cough and sputum production and her medication history, chest x-ray ordered.   Continue mucinex  plain.  Refill sent for promethezine-dm (discussed side effects), benzonatate .  Continue albuterol  as needed.  Solumedrol 40 mg given in the office (allergy to prednisone, but does ok with solumedrol.   Follow up with PCP if not better or failed to improve.  ER/UC precautions given.

## 2024-01-30 NOTE — Telephone Encounter (Signed)
 Please advise if okay to send spacer Rx as pt requested

## 2024-01-30 NOTE — Progress Notes (Signed)
 Established Patient Office Visit  Subjective   Patient ID: Rachel Torres, female    DOB: 1956-06-11  Age: 68 y.o. MRN: [id number]  Chief Complaint  Patient presents with   Cough    X 3 days; is productive at times. Has been taking cough syrup and perles for cough; also took mucinex . Patient has also been hoarse and had nasal drainage.     Cough Associated symptoms include a sore throat. Pertinent negatives include no chest pain, chills, ear pain, fever, headaches, heartburn or shortness of breath.    Rachel Torres is a 68 year old female, patient of Dr. Malissa Se, with past medical history of asthma, OSA, CAD, heart failure presents today for an acute visit to discuss cough, sore throat and shortness of breath.   Cough: Symptom onset was Saturday with productive cough with clear. She does not always cough up mucus. She has hoarseness, runny nose and post nasal drainage. She had maxillary sinus pain last night. She denies any fever, chills. She has been taking tessalon  and cough syrup that was prescribed back in November. Had acute bronchitis in November. She has tried mucinex  extra strength. She feels worse since the past two days. She quit smoking 40 plus years ago. She has had her flu shot this season. She was at the ER with her husband last Monday and had to walk through the waiting room and not sure if she was exposed to anything.    Patient Active Problem List   Diagnosis Date Noted   Adenomatous polyp of colon 01/19/2024   Heart failure with improved ejection fraction (HFimpEF) (HCC) 01/18/2024   Abnormal metabolism 11/14/2023   Weight regain after inital weight loss 11/14/2023   Multiple closed tarsal fractures of left foot, sequela 07/12/2023   Arm contusion, right, sequela 07/12/2023   Lumbar disc disease 07/12/2023   PAF (paroxysmal atrial fibrillation) (HCC) 06/09/2023   Paresthesia 06/03/2023   Obesity, Beginning BMI 35.45 02/17/2023   Tinnitus 01/14/2023   Change in  vision 01/14/2023   Encounter for screening colonoscopy 01/14/2023   Personal history of fall 01/14/2023   Problem related to unspecified psychosocial circumstances 12/28/2022   Person encountering health services to consult on behalf of another person 12/28/2022   Palpitation 12/10/2022   Fatigue 10/26/2022   B12 deficiency 10/26/2022   Right shoulder pain 09/15/2022   Depression 09/14/2022   Other constipation 05/12/2022   Medicare annual wellness visit, initial 07/17/2021   TMJ (dislocation of temporomandibular joint) 07/17/2021   Encounter for screening mammogram for breast cancer 06/05/2021   Diarrhea 04/11/2020   Epigastric pain 02/19/2020   Dark stools 02/19/2020   History of atrial fibrillation 02/05/2019   Shortness of breath 01/27/2019   Irregular surface of cornea 12/25/2018   Acute cough 09/08/2017   History of HPV infection 12/01/2016   Screening mammogram, encounter for 11/29/2016   Estrogen deficiency 11/29/2016   Encounter for routine gynecological examination 11/29/2016   Grade III hemorrhoids 10/06/2016   Tachycardia 08/14/2016   Hemorrhoids 08/13/2016   Atypical migraine 08/03/2016   CAD (coronary artery disease)    AAA (abdominal aortic aneurysm) (HCC)    Chronic combined systolic (congestive) and diastolic (congestive) heart failure (HCC)    Prediabetes 02/11/2016   Generalized anxiety disorder 12/08/2015   Panic attacks 07/28/2015   Chest pain 07/09/2013   Sensorineural hearing loss 03/27/2013   Vitamin D  deficiency 07/03/2012   H/O aortic dissection 05/12/2012   Routine general medical examination at a health care facility  09/20/2011   Atypical chest pain 09/15/2011   S/P AVR (aortic valve replacement) 01/07/2011   CORONARY ARTERY BYPASS GRAFT, HX OF 01/07/2011   Arthritis 12/20/2010   Back pain 12/15/2010   H/O dissecting abdominal aortic aneurysm repair 08/24/2010   IRRITABLE BOWEL SYNDROME 09/09/2009   Obstructive sleep apnea 08/04/2009    External hemorrhoid 06/26/2009   Osteoporosis 01/09/2009   Hyperlipidemia 07/26/2007   Depression with anxiety 07/26/2007   Essential hypertension 07/26/2007   Allergic rhinitis 07/26/2007   Asthma 07/26/2007   GERD 07/26/2007   Osteoarthritis 07/26/2007   Ocular migraine 07/26/2007   Mild intermittent asthma without complication 07/26/2007   Past Medical History:  Diagnosis Date   AAA (abdominal aortic aneurysm) (HCC)    a. Chronic w/o evidence of aneurysmal dil on CTA 01/2016.   Alcohol abuse    Allergy    Anemia    Anxiety    Asthma    CAD (coronary artery disease)    a. 08/2010 s/p CABG x 1 (VG->RCA) @ time of Ao dissection repair; b. 01/2016 Lexiscan  MV: mid antsept/apical defect w/ ? peri-infarct ischemia-->likely attenuation-->Med Rx.   Chewing difficulty    Chronic combined systolic (congestive) and diastolic (congestive) heart failure (HCC)    a. 2012 EF 30-35%; b. 04/2015 EF 40-45%; c. 01/2016 Echo: Ef 55-65%, nl AoV bioprosthesis, nl RV, nl PASP.   Constipation    Depression    Edema of both lower extremities    Gallbladder sludge    GERD (gastroesophageal reflux disease)    Heart failure (HCC)    Heart valve problem    History of Bicuspid Aortic Valve    a. 08/2010 s/p AVR @ time of Ao dissection repair; b. 01/2016 Echo: Ef 55-65%, nl AoV bioprosthesis, nl RV, nl PASP.   Hx of repair of dissecting thoracic aortic aneurysm, Stanford type A    a. 08/2010 s/p repair with AVR and VG->RCA; b. 01/2016 CTA: stable appearance of Asc Thoracic Aortic graft. Opacification of flase lumen of chronic abd Ao dissection w/ retrograde flow through lumbar arteries. No aneurysmal dil of flase lumen.   Hyperlipidemia    Hypertension    Hypertensive heart disease    IBS (irritable bowel syndrome)    Internal hemorrhoids    Kidney problem    Marfan syndrome    pt denies   Migraine    Obstructive sleep apnea    Osteoarthritis    Osteoporosis    Palpitations    Pneumonia    RA  (rheumatoid arthritis) (HCC)    a. Followed by Dr. Chaim Colony   Allergies  Allergen Reactions   Alendronate Sodium Other (See Comments)    GI upset   Gabapentin  Other (See Comments)   Ginzing [Ginseng] Other (See Comments)    Hyper and jitery    Hydrocod Poli-Chlorphe Poli Er Itching   Hydrocodone-Acetaminophen  Itching   Imitrex [Sumatriptan]     Contraindicated w/ heart condition   Oxycodone  Itching   Peanut-Containing Drug Products Other (See Comments)    Reaction:  Migraines    Tramadol  Other (See Comments)    itching   Hydrocodone-Guaifenesin  Itching   Iodine Itching    Patient reports erroneous entry.   Prednisone Rash   Tape Itching         01/18/2024   10:50 AM 12/29/2023    8:24 AM 11/22/2023   12:06 PM  Depression screen PHQ 2/9  Decreased Interest 0 1 1  Down, Depressed, Hopeless 0 1 1  PHQ - 2  Score 0 2 2  Altered sleeping 3 2 2   Tired, decreased energy 1 2 2   Change in appetite 2 0 0  Feeling bad or failure about yourself  0 0 0  Trouble concentrating 1 0 0  Moving slowly or fidgety/restless 0 0 0  Suicidal thoughts 0 0 0  PHQ-9 Score 7 6 6   Difficult doing work/chores Not difficult at all  Somewhat difficult       11/22/2023   12:07 PM 07/12/2023    9:59 AM 06/03/2023   12:00 PM 01/14/2023    3:18 PM  GAD 7 : Generalized Anxiety Score  Nervous, Anxious, on Edge 0 0 2 1  Control/stop worrying 0 0 1 0  Worry too much - different things 0 0 0 0  Trouble relaxing 0 1 2 1   Restless 0 0 1 1  Easily annoyed or irritable 1 0 3 1  Afraid - awful might happen 0 0 1 0  Total GAD 7 Score 1 1 10 4   Anxiety Difficulty Not difficult at all Not difficult at all Somewhat difficult Somewhat difficult      Review of Systems  Constitutional:  Positive for malaise/fatigue. Negative for chills and fever.  HENT:  Positive for congestion, sinus pain and sore throat. Negative for ear discharge, ear pain, hearing loss, nosebleeds and tinnitus.   Respiratory:  Positive for  cough. Negative for shortness of breath.   Cardiovascular:  Negative for chest pain.  Gastrointestinal:  Negative for abdominal pain, constipation, diarrhea, heartburn, nausea and vomiting.  Genitourinary:  Negative for dysuria, frequency and urgency.  Neurological:  Negative for dizziness and headaches.  Endo/Heme/Allergies:  Negative for polydipsia.  Psychiatric/Behavioral:  Negative for depression and suicidal ideas. The patient is not nervous/anxious.       Objective:     BP 120/74 (BP Location: Left Arm, Patient Position: Sitting, Cuff Size: Normal)   Pulse (!) 101   Temp 97.8 F (36.6 C) (Oral)   Wt 214 lb (97.1 kg)   SpO2 94%   BMI 36.17 kg/m  BP Readings from Last 3 Encounters:  01/30/24 120/74  01/19/24 117/76  01/18/24 136/86   Wt Readings from Last 3 Encounters:  01/30/24 214 lb (97.1 kg)  01/19/24 206 lb (93.4 kg)  01/18/24 213 lb 6 oz (96.8 kg)      Physical Exam Vitals and nursing note reviewed.  Constitutional:      Appearance: Normal appearance.  HENT:     Right Ear: Tympanic membrane, ear canal and external ear normal.     Left Ear: Tympanic membrane, ear canal and external ear normal.     Nose: Rhinorrhea present.  Eyes:     Conjunctiva/sclera: Conjunctivae normal.  Cardiovascular:     Rate and Rhythm: Normal rate and regular rhythm.     Pulses: Normal pulses.     Heart sounds: Normal heart sounds.  Pulmonary:     Effort: Pulmonary effort is normal.     Breath sounds: Wheezing present.  Skin:    General: Skin is warm.  Neurological:     Mental Status: She is alert and oriented to person, place, and time.  Psychiatric:        Mood and Affect: Mood normal.        Behavior: Behavior normal.        Thought Content: Thought content normal.        Judgment: Judgment normal.      Results for orders placed or performed in  visit on 01/30/24  POC COVID-19  Result Value Ref Range   SARS Coronavirus 2 Ag Negative Negative  POCT Influenza A/B   Result Value Ref Range   Influenza A, POC Negative Negative   Influenza B, POC Negative Negative       The 10-year ASCVD risk score (Arnett DK, et al., 2019) is: 7%    Assessment & Plan:  Acute cough Assessment & Plan: Covid and flu negative.  Differentials include Viral URI, pnuemonia, bronchitis.   Wheezes heard on auscultation.   Given the cough and sputum production and her medication history, chest x-ray ordered.   Continue mucinex  plain.  Refill sent for promethezine-dm (discussed side effects), benzonatate .  Continue albuterol  as needed.  Solumedrol 40 mg given in the office (allergy to prednisone, but does ok with solumedrol.   Follow up with PCP if not better or failed to improve.  ER/UC precautions given.  Orders: -     POC COVID-19 BinaxNow -     POCT Influenza A/B -     DG Chest 2 View -     Promethazine -DM; Take 5 mLs by mouth 4 (four) times daily as needed.  Dispense: 118 mL; Refill: 0 -     Benzonatate ; Take 1 capsule (200 mg total) by mouth 3 (three) times daily as needed.  Dispense: 20 capsule; Refill: 0 -     methylPREDNISolone  Acetate     Return if symptoms worsen or fail to improve.    Jolanda Nation, NP

## 2024-01-30 NOTE — Telephone Encounter (Signed)
 Chief Complaint: cough Symptoms: Cough with SOB during coughing fits, sore throat Frequency: since Saturday Pertinent Negatives: Patient denies fever Disposition: [] ED /[] Urgent Care (no appt availability in office) / [x] Appointment(In office/virtual)/ []  [facility]/ [] Home Care/ [] Refused Recommended Disposition /[] [facility]/ []  Follow-up with PCP Additional Notes: Pt reports cough since Saturday. States mucus is clear. Endorses SOB during coughing fits, but denies any SOB at rest when she is not coughing. Endorses some CP with coughing as well. Also endorses sore throat which she states is from nasal drainage. States her roommate was sick last week with something similar. Pt is currently having her house remodeled and is staying in an AirB&B. Pt can't find her pulse ox to check her O2. Pt states she is taking Tessalon  Pearls and Mucinex  but is not feeling much better. Pt is also primary caregiver for her husband who has Parkinsons disease. Per protocol, pt scheduled in office today 2/10 at 1400. Pt has a home care giver over today who agrees to stay her husband so she can go to said appt. RN advised pt to call 911 for any SOB that occurs without having, CP, or any worsening, pt verbalized understanding.   Copied from CRM [id number]. Topic: Clinical - Red Word Triage >> Jan 30, 2024 11:18 AM Aline Ireland wrote: Red Word that prompted transfer to Nurse Triage: Pt stated that she is having a bad cough, sore throat and shortness of breath. Reason for Disposition  SEVERE coughing spells (e.g., whooping sound after coughing, vomiting after coughing)  Answer Assessment - Initial Assessment Questions 1. ONSET: "When did the cough begin?"      Saturday 2. SEVERITY: "How bad is the cough today?"      Becoming more frequent - coughing spells make her feel SOB, no SOB when pt is not coughing 3. SPUTUM: "Describe the color of your sputum" (none, dry cough; clear, white, yellow, green)      Clear 4. HEMOPTYSIS: "Are you coughing up any blood?" If so ask: "How much?" (flecks, streaks, tablespoons, etc.)     No 5. DIFFICULTY BREATHING: "Are you having difficulty breathing?" If Yes, ask: "How bad is it?" (e.g., mild, moderate, severe)    - MILD: No SOB at rest, mild SOB with walking, speaks normally in sentences, can lie down, no retractions, pulse < 100.    - MODERATE: SOB at rest, SOB with minimal exertion and prefers to sit, cannot lie down flat, speaks in phrases, mild retractions, audible wheezing, pulse 100-120.    - SEVERE: Very SOB at rest, speaks in single words, struggling to breathe, sitting hunched forward, retractions, pulse > 120      Mild 6. FEVER: "Do you have a fever?" If Yes, ask: "What is your temperature, how was it measured, and when did it start?"     No 7. CARDIAC HISTORY: "Do you have any history of heart disease?" (e.g., heart attack, congestive heart failure)      HF, CAD 8. LUNG HISTORY: "Do you have any history of lung disease?"  (e.g., pulmonary embolus, asthma, emphysema)     HF, COVID hospitalization 9. PE RISK FACTORS: "Do you have a history of blood clots?" (or: recent major surgery, recent prolonged travel, bedridden)     No 10. OTHER SYMPTOMS: "Do you have any other symptoms?" (e.g., runny nose, wheezing, chest pain) CP was coughing, sore throat,  runny nose - taking Tessalon  pearls, does not have inhaler with her (staying in an AirB&B during remodeling),  also taking Mucinex  which isnt very helpful  Answer Assessment - Initial Assessment Questions 1. RESPIRATORY STATUS: "Describe your breathing?" (e.g., wheezing, shortness of breath, unable to speak, severe coughing)      "My voice is horrible", no wheezing, "I am having to stop everything when I get a coughing fit", SOB mostly with coughing, no SOB at rest w/ out coughing 2. ONSET: "When did this breathing problem begin?"      Saturday 3. PATTERN "Does the difficult breathing come and go, or  has it been constant since it started?"      Comes and goes 4. SEVERITY: "How bad is your breathing?" (e.g., mild, moderate, severe)    - MILD: No SOB at rest, mild SOB with walking, speaks normally in sentences, can lie down, no retractions, pulse < 100.    - MODERATE: SOB at rest, SOB with minimal exertion and prefers to sit, cannot lie down flat, speaks in phrases, mild retractions, audible wheezing, pulse 100-120.    - SEVERE: Very SOB at rest, speaks in single words, struggling to breathe, sitting hunched forward, retractions, pulse > 120      "Felt winded this AM while getting up and walking and helping husband to pack (did a lot more than I would normally do)", no SOB at rest and states it feels mild 5. RECURRENT SYMPTOM: "Have you had difficulty breathing before?" If Yes, ask: "When was the last time?" and "What happened that time?"      Not coughing anything up this time, was clear yesterday 6. CARDIAC HISTORY: "Do you have any history of heart disease?" (e.g., heart attack, angina, bypass surgery, angioplasty)      HF, CAD 7. LUNG HISTORY: "Do you have any history of lung disease?"  (e.g., pulmonary embolus, asthma, emphysema)     HF, asthma, "O2 was dropping with COVID in the hospital" 8. CAUSE: "What do you think is causing the breathing problem?"      Roommate was sick last week 9. OTHER SYMPTOMS: "Do you have any other symptoms? (e.g., dizziness, runny nose, cough, chest pain, fever)     Cough, sore throat 10. O2 SATURATION MONITOR:  "Do you use an oxygen  saturation monitor (pulse oximeter) at home?" If Yes, ask: "What is your reading (oxygen  level) today?" "What is your usual oxygen  saturation reading?" (e.g., 95%)       Pt is remodeling her house and does not know where her pulse ox is  Protocols used: Breathing Difficulty-A-AH, Cough - Acute Productive-A-AH

## 2024-01-31 ENCOUNTER — Ambulatory Visit: Payer: Medicare HMO | Admitting: Psychology

## 2024-01-31 ENCOUNTER — Ambulatory Visit: Payer: Medicare HMO

## 2024-01-31 ENCOUNTER — Encounter: Payer: Self-pay | Admitting: General Practice

## 2024-01-31 DIAGNOSIS — F331 Major depressive disorder, recurrent, moderate: Secondary | ICD-10-CM | POA: Diagnosis not present

## 2024-01-31 NOTE — Progress Notes (Signed)
[facility] Counselor/Therapist Progress Note  Patient ID: Rachel Torres, MRN: [id number],    Date: 01/31/2024  Time Spent: 10:00am-10:45am  45 minutes   Treatment Type: Individual Therapy  Reported Symptoms: stress  Mental Status Exam: Appearance:  Casual     Behavior: Appropriate  Motor: Normal  Speech/Language:  Normal Rate  Affect: Appropriate  Mood: normal  Thought process: normal  Thought content:   WNL  Sensory/Perceptual disturbances:   WNL  Orientation: oriented to person, place, time/date, and situation  Attention: Good  Concentration: Good  Memory: WNL  Fund of knowledge:  Good  Insight:   Good  Judgment:  Good  Impulse Control: Good   Risk Assessment: Danger to Self:  No Self-injurious Behavior: No Danger to Others: No Duty to Warn:no Physical Aggression / Violence:No  Access to Firearms a concern: No  Gang Involvement:No   Subjective: Pt present for face-to-face individual therapy via video.  Pt consents to telehealth video session and is aware of limitations and benefits of virtual sessions.  Location of pt: home Location of therapist: home office.  Pt talked about being sick with an upper respiratory virus.   She has been sick a lot the past couple of months so pt states she is tired of being sick.  She is getting run down with care giving and the house renovations.   Pt went to a caregiver meeting at the [place] last week.   It was helpful for pt to connect with people who are also caregivers.   Pt talked about her husband Rachel Torres.  They have new caregiver program where the caregivers can take Rachel Torres places.  This helps pt bc it gives her some time alone at home.   Pt is without a caregiver today which is stressful since she is sick.  Worked on self care strategies.  Provided supportive therapy.  Interventions: Cognitive Behavioral Therapy and Insight-Oriented  Diagnosis:  F33.1  Plan of Care: Recommend ongoing therapy.   Pt  participated in setting treatment goals.   Pt needs a place to talk about stressors.  Plan to meet every two weeks.    Treatment Plan (target date: 03/23/2024) Client Abilities/Strengths  Pt is bright, engaging, and motivated for therapy.   Client Treatment Preferences  Individual therapy.  Client Statement of Needs  Improve coping skills.  Have a place to talk about stressors.   Symptoms  Depressed or irritable mood. Unresolved grief issues.  Problems Addressed  Unipolar Depression Goals 1. Alleviate depressive symptoms and return to previous level of effective functioning. 2. Appropriately grieve the loss in order to normalize mood and to return to previously adaptive level of functioning. Objective Learn and implement behavioral strategies to overcome depression. Target Date: 2024-03-23 Frequency: Biweekly  Progress: 40 Modality: individual  Related Interventions Engage the client in "behavioral activation," increasing his/her activity level and contact with sources of reward, while identifying processes that inhibit activation.  Use behavioral techniques such as instruction, rehearsal, role-playing, role reversal, as needed, to facilitate activity in the client's daily life; reinforce success. Assist the client in developing skills that increase the likelihood of deriving pleasure from behavioral activation (e.g., assertiveness skills, developing an exercise plan, less internal/more external focus, increased social involvement); reinforce success. Objective Identify important people in life, past and present, and describe the quality, good and poor, of those relationships. Target Date: 2024-03-23 Frequency: Biweekly  Progress: 40 Modality: individual  Related Interventions Conduct Interpersonal Therapy beginning with the assessment of the  client's "interpersonal inventory" of important past and present relationships; develop a case formulation linking depression to grief,  interpersonal role disputes, role transitions, and/or interpersonal deficits). Objective Learn and implement problem-solving and decision-making skills. Target Date: 2024-03-23 Frequency: Biweekly  Progress: 40 Modality: individual  Related Interventions Conduct Problem-Solving Therapy using techniques such as psychoeducation, modeling, and role-playing to teach client problem-solving skills (i.e., defining a problem specifically, generating possible solutions, evaluating the pros and cons of each solution, selecting and implementing a plan of action, evaluating the efficacy of the plan, accepting or revising the plan); role-play application of the problem-solving skill to a real life issue. Encourage in the client the development of a positive problem orientation in which problems and solving them are viewed as a natural part of life and not something to be feared, despaired, or avoided. 3. Develop healthy interpersonal relationships that lead to the alleviation and help prevent the relapse of depression. 4. Develop healthy thinking patterns and beliefs about self, others, and the world that lead to the alleviation and help prevent the relapse of depression. 5. Recognize, accept, and cope with feelings of depression. Diagnosis F33.1  Conditions For Discharge Achievement of treatment goals and objectives   Salomon Fick, LCSW

## 2024-02-02 ENCOUNTER — Ambulatory Visit: Payer: Medicare HMO

## 2024-02-02 ENCOUNTER — Ambulatory Visit: Payer: Medicare HMO | Admitting: Dermatology

## 2024-02-02 NOTE — Therapy (Incomplete)
OUTPATIENT PHYSICAL THERAPY TREATMENT/ Physical Therapy Progress Note   Dates of reporting period  09/26/23   to   02/02/24      Patient Name: Rachel Torres MRN: [id number] DOB:06-06-1956, 68 y.o., female Today's Date: 02/02/2024  END OF SESSION:                 Past Medical History:  Diagnosis Date   AAA (abdominal aortic aneurysm) (HCC)    a. Chronic w/o evidence of aneurysmal dil on CTA 01/2016.   Alcohol abuse    Allergy    Anemia    Anxiety    Asthma    CAD (coronary artery disease)    a. 08/2010 s/p CABG x 1 (VG->RCA) @ time of Ao dissection repair; b. 01/2016 Lexiscan MV: mid antsept/apical defect w/ ? peri-infarct ischemia-->likely attenuation-->Med Rx.   Chewing difficulty    Chronic combined systolic (congestive) and diastolic (congestive) heart failure (HCC)    a. 2012 EF 30-35%; b. 04/2015 EF 40-45%; c. 01/2016 Echo: Ef 55-65%, nl AoV bioprosthesis, nl RV, nl PASP.   Constipation    Depression    Edema of both lower extremities    Gallbladder sludge    GERD (gastroesophageal reflux disease)    Heart failure (HCC)    Heart valve problem    History of Bicuspid Aortic Valve    a. 08/2010 s/p AVR @ time of Ao dissection repair; b. 01/2016 Echo: Ef 55-65%, nl AoV bioprosthesis, nl RV, nl PASP.   Hx of repair of dissecting thoracic aortic aneurysm, Stanford type A    a. 08/2010 s/p repair with AVR and VG->RCA; b. 01/2016 CTA: stable appearance of Asc Thoracic Aortic graft. Opacification of flase lumen of chronic abd Ao dissection w/ retrograde flow through lumbar arteries. No aneurysmal dil of flase lumen.   Hyperlipidemia    Hypertension    Hypertensive heart disease    IBS (irritable bowel syndrome)    Internal hemorrhoids    Kidney problem    Marfan syndrome    pt denies   Migraine    Obstructive sleep apnea    Osteoarthritis    Osteoporosis    Palpitations    Pneumonia    RA (rheumatoid arthritis) (HCC)    a. Followed by Dr. Mallie Mussel   Past  Surgical History:  Procedure Laterality Date   ABDOMINAL AORTIC ANEURYSM REPAIR     CARDIAC CATHETERIZATION  2001   CESAREAN SECTION  1986 & 1991   CHOLECYSTECTOMY     COLONOSCOPY     COLONOSCOPY WITH PROPOFOL N/A 01/19/2024   Procedure: COLONOSCOPY WITH PROPOFOL;  Surgeon: Wyline Mood, MD;  Location[facility] ENDOSCOPY;  Service: Gastroenterology;  Laterality: N/A;   ESOPHAGOGASTRODUODENOSCOPY (EGD) WITH PROPOFOL N/A 05/08/2020   Procedure: ESOPHAGOGASTRODUODENOSCOPY (EGD) WITH PROPOFOL;  Surgeon: Pasty Spillers, MD;  Location: [facility] ENDOSCOPY;  Service: Endoscopy;  Laterality: N/A;   FRACTURE SURGERY Right    shoulder replacement   HEMORRHOID BANDING     ORIF DISTAL RADIUS FRACTURE Left    POLYPECTOMY  01/19/2024   Procedure: POLYPECTOMY;  Surgeon: Wyline Mood, MD;  Location: [facility] ENDOSCOPY;  Service: Gastroenterology;;   UPPER GASTROINTESTINAL ENDOSCOPY     Patient Active Problem List   Diagnosis Date Noted   Adenomatous polyp of colon 01/19/2024   Heart failure with improved ejection fraction (HFimpEF) (HCC) 01/18/2024   Abnormal metabolism 11/14/2023   Weight regain after inital weight loss 11/14/2023   Multiple closed tarsal fractures of left foot, sequela 07/12/2023   Arm  contusion, right, sequela 07/12/2023   Lumbar disc disease 07/12/2023   PAF (paroxysmal atrial fibrillation) (HCC) 06/09/2023   Paresthesia 06/03/2023   Obesity, Beginning BMI 35.45 02/17/2023   Tinnitus 01/14/2023   Change in vision 01/14/2023   Encounter for screening colonoscopy 01/14/2023   Personal history of fall 01/14/2023   Problem related to unspecified psychosocial circumstances 12/28/2022   Person encountering health services to consult on behalf of another person 12/28/2022   Palpitation 12/10/2022   Fatigue 10/26/2022   B12 deficiency 10/26/2022   Right shoulder pain 09/15/2022   Depression 09/14/2022   Other constipation 05/12/2022   Medicare annual wellness visit, initial 07/17/2021    TMJ (dislocation of temporomandibular joint) 07/17/2021   Encounter for screening mammogram for breast cancer 06/05/2021   Diarrhea 04/11/2020   Epigastric pain 02/19/2020   Dark stools 02/19/2020   History of atrial fibrillation 02/05/2019   Shortness of breath 01/27/2019   Irregular surface of cornea 12/25/2018   Acute cough 09/08/2017   History of HPV infection 12/01/2016   Screening mammogram, encounter for 11/29/2016   Estrogen deficiency 11/29/2016   Encounter for routine gynecological examination 11/29/2016   Grade III hemorrhoids 10/06/2016   Tachycardia 08/14/2016   Hemorrhoids 08/13/2016   Atypical migraine 08/03/2016   CAD (coronary artery disease)    AAA (abdominal aortic aneurysm) (HCC)    Chronic combined systolic (congestive) and diastolic (congestive) heart failure (HCC)    Prediabetes 02/11/2016   Generalized anxiety disorder 12/08/2015   Panic attacks 07/28/2015   Chest pain 07/09/2013   Sensorineural hearing loss 03/27/2013   Vitamin D deficiency 07/03/2012   H/O aortic dissection 05/12/2012   Routine general medical examination at a health care facility 09/20/2011   Atypical chest pain 09/15/2011   S/P AVR (aortic valve replacement) 01/07/2011   CORONARY ARTERY BYPASS GRAFT, HX OF 01/07/2011   Arthritis 12/20/2010   Back pain 12/15/2010   H/O dissecting abdominal aortic aneurysm repair 08/24/2010   IRRITABLE BOWEL SYNDROME 09/09/2009   Obstructive sleep apnea 08/04/2009   External hemorrhoid 06/26/2009   Osteoporosis 01/09/2009   Hyperlipidemia 07/26/2007   Depression with anxiety 07/26/2007   Essential hypertension 07/26/2007   Allergic rhinitis 07/26/2007   Asthma 07/26/2007   GERD 07/26/2007   Osteoarthritis 07/26/2007   Ocular migraine 07/26/2007   Mild intermittent asthma without complication 07/26/2007    PCP: [name] MD   REFERRING PROVIDER: Stormy Card  REFERRING DIAG: L rotator cuff   THERAPY DIAG:  No diagnosis  found.  Rationale for Evaluation and Treatment: Rehabilitation  ONSET DATE: Feb 2024  SUBJECTIVE:  SUBJECTIVE STATEMENT: ***  Hand dominance: Left  PERTINENT HISTORY: Rachel Torres is a 66yoF familiar to our services. Pt referred to PT for strengthening of chronic left shoulder DJD, known remote RC injury. Recently fell and broke four metatarsals, since cleared from use of CAM rocker. She was treated successfully in past for right shoulder pain and has past medical history of Right shoulder surgery replacement. 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 PMH aorta type A dissection in sept 2011 s/p aortic valve replacement, bypass of R coronary artery, anemia, anxiety, CAD, CHF, depression, GERD, Heart failure, HLD, HTN, IBS, migraine, osteoarthritis, osteoporosis, RA, back pain.    Back pain:  Patient has new referral for radiculopathy, lumbar region. MRI showed degeneration of L1-2 and L2-3 with some disc space collapse and minor retrolisthesis of L2-3. Issue began in February, felt numbness in L thigh. No cause known, but she was at a museum and noticed it was numb. Sometimes L foot numbness and toe numbness of 2nd and 3rd toe causing tripping. This caused her to trip July 4th and break five metatarsals. Some minor pain in the low pain noted. Pain noted to be 2-3/10 Pain in hip 3-4/10 of L hip.    PAIN:  Are you having pain? Yes, 3/10 pain in lateral deltoids in the shoulder.  PRECAUTIONS: None  FALLS:  Has patient fallen in last 6 months? Yes. Number of falls 2  PATIENT GOALS:to lift more than 3lb with LUE, be able to eat a meal without arm being tired.    OBJECTIVE:  DIAGNOSTIC FINDINGS:  IMPRESSION: Mild degenerative change in the cervical spine without acute fracture or traumatic subluxation.    IMPRESSION: Full width, full width tears of the supraspinatus and infraspinatus tendons with retraction to the glenoid. Grade 2/3 infraspinatus atrophy.   Moderate tendinosis of the distal subscapularis tendon with articular sided fraying of the cephalad fibers.   Moderate intra-articular long head biceps tendinosis with fraying and possible partial tearing.   Mild glenohumeral and moderate AC joint osteoarthritis.   Degenerative superior labral tearing anteriorly and posteriorly.   PATIENT SURVEYS:  Quick Dash 50%  and FOTO 46%   COGNITION: Overall cognitive status: Within functional limits for tasks assessed                                  SENSATION: WFL   POSTURE: Slight rounded shoulders and shoulder elevation    UPPER EXTREMITY ROM:    Active ROM Right eval 9/16: recert RUE Left eval 9/16 RECERT LUE  Shoulder flexion 64 122 91 92  Shoulder extension        Shoulder abduction 95 88 52 82  Shoulder adduction        Shoulder internal rotation Unable to test at 90 61 degrees 5 40  Shoulder external rotation 18at side of body -5 degrees at 90 degrees 34 35  Elbow flexion [facility]  WFL   Elbow extension [facility]  WFL   Wrist flexion        Wrist extension        Wrist ulnar deviation        Wrist radial deviation        Wrist pronation        Wrist supination        (Blank rows = not tested)   UPPER EXTREMITY MMT:   MMT Right eval Left eval  Shoulder flexion  Shoulder extension      Shoulder abduction      Shoulder adduction      Shoulder internal rotation      Shoulder external rotation      Middle trapezius      Lower trapezius      Elbow flexion      Elbow extension      Wrist flexion      Wrist extension      Wrist ulnar deviation      Wrist radial deviation      Wrist pronation      Wrist supination      Grip strength (lbs) 30 35  (Blank rows = not tested) Unable to test strength due to pain with motion   TRUNK ROM: Trunk Flexion 65  Trunk  Extension 7  Trunk R SB Limited 25%  Trunk L SB WFL  Trunk R rotation WFL  Trunk L rotation Limited 25%    Ankle ROM Seated: Motion: AROM seated Right Left  Plantarflexion 4+ 3  Dorsiflexion 4+ 4  Inversion 4 3+  Eversion 4+ 3+    SHOULDER SPECIAL TESTS: Impingement tests:  unable to test due to pain in starting position SLAP lesions:  unable to test due to pain in starting position Instability tests:  unable to test due to pain in starting position Rotator cuff assessment:  unable to test due to pain in starting position Biceps assessment:  unable to test due to pain in starting position   LOW BACK SPECIAL TEST: SLR: negative  Scour test: negative FABER: negative Pelvic compression/distraction: negative  Slump test positive LLE  6 min walk test: 1160 ft increased left foot drag and antalgic gait pattern with foot eversion    JOINT MOBILITY TESTING:  AP: painful PA: painful Inferior: painful Distraction: reduces pain   Back mobilization hypomobile and painful to grade II mobilizations    PALPATION:  Tenderness to all joint regions of GH     TODAY'S TREATMENT: DATE: 02/02/24  Physical therapy treatment session today consisted of completing assessment of goals and administration of testing as demonstrated and documented in flow sheet, treatment, and goals section of this note. Addition treatments may be found below.   Manual:  Grade II mobilizations thoracic and lumbar spine x 8 minutes  Therex: Supine: -posterior pelvic tilt 10x -thoracic rotation 10x  -nerve glide 15x each LE -single knee to chest 60 seconds each LE -figure four stretch 60 seconds each LE -GTB abduction 15x  Seated:  -mini rollout swiss ball 15x -breathing 360 x multiple reps  -GTB hamstring curl 15x -GTB PF press 15   PATIENT EDUCATION: Education details: goals, POC  Person educated: Patient Education method: Explanation, Demonstration, Tactile cues, Verbal cues, and  Handouts Education comprehension: verbalized understanding, returned demonstration, verbal cues required, and tactile cues required  HOME EXERCISE PROGRAM:  Access Code: [email or web address] URL: [email or web address] Date: 10/03/2023 Prepared by: Precious Bard  Exercises - Seated Scapular Retraction  - 1 x daily - 7 x weekly - 2 sets - 10 reps - 5 hold - Neck Sidebending  - 1 x daily - 7 x weekly - 2 sets - 2 reps - 30 hold - Supine Isometric Shoulder Extension with Towel  - 1 x daily - 7 x weekly - 2 sets - 10 reps - 5 hold - Supine Isometric Shoulder Adduction with Towel Roll  - 1 x daily - 7 x weekly - 2 sets - 10 reps - 5 hold - Seated  Bilateral Shoulder Flexion Towel Slide at Table Top  - 1 x daily - 7 x weekly - 1 sets - 3 reps - 30 hold - Supine Posterior Pelvic Tilt  - 1 x daily - 7 x weekly - 2 sets - 5 reps - 30 hold - Supine LE Neural Mobilization  - 1 x daily - 7 x weekly - 2 sets - 10 reps - 5 hold - Seated Ankle Alphabet  - 1 x daily - 7 x weekly - 2 sets - 1 reps - 5 hold  ASSESSMENT:  CLINICAL IMPRESSION:  Goals performed previous session on 01/26/24, please refer to this note for further details. Patient's condition has the potential to improve in response to therapy. Maximum improvement is yet to be obtained. The anticipated improvement is attainable and reasonable in a generally predictable time.  ***   Pt will continue to benefit from skilled therapy to address remaining deficits in order to improve overall QoL and return to PLOF.      OBJECTIVE IMPAIRMENTS: cardiopulmonary status limiting activity, decreased activity tolerance, decreased mobility, increased fascial restrictions, impaired perceived functional ability, increased muscle spasms, impaired flexibility, impaired UE functional use, improper body mechanics, postural dysfunction, and pain.   ACTIVITY LIMITATIONS: carrying, lifting, bed mobility, bathing, dressing, self feeding, reach over head, and caring for  others  PARTICIPATION LIMITATIONS: meal prep, cleaning, laundry, personal finances, interpersonal relationship, driving, shopping, community activity, yard work, church, and caregiving for husband  PERSONAL FACTORS: Age, Fitness, Past/current experiences, Time since onset of injury/illness/exacerbation, and 3+ comorbidities: aorta type A dissection in sept 2011 with aortic valve replacement bypass of R coronary artery, AAA, anemia, anxiety, CAD, CHF, depression, GERD, Heart failure, HLD, HTN, IBS, Kidney problems, migraine, osteoarthritis, osteoporosis, RA, ocular migraines, back pain  are also affecting patient's functional outcome.   REHAB POTENTIAL: Fair    CLINICAL DECISION MAKING: Evolving/moderate complexity  EVALUATION COMPLEXITY: Moderate   GOALS: Goals reviewed with patient? Yes  SHORT TERM GOALS: Target date: 12/27/2023  Patient will be independent in home exercise program to improve strength/mobility for better functional independence with ADLs. Baseline:7/23: HEP given 9/16: compliant 1x/week  Goal status: On going     LONG TERM GOALS: Target date: 03/22/2024   Patient will increase FOTO score to equal to or greater than  57%   to demonstrate statistically significant improvement in mobility and quality of life.  Baseline: 7/22: 46 9/16: 50% 10/7: 40% 12/10: 57% Goal status: MET  2.  Patient will improve shoulder AROM to > 140 degrees of flexion, scaption, and abduction for improved ability to perform overhead activities. Baseline: 7/22: see above 9/16: see above 10/7 : defer to exacerbation 12/10: shoulder abduction >140, flexion < 140 , assess scaption next visit 2/6: flexion >140, abduction >140 scaption >140 Goal status: MET  3.  Patient will demonstrate adequate shoulder ROM and strength to be able to shave and dress independently with pain less than 3/10. Baseline: 7/23: 6/10 pain with dressing, limited ROM, unable to test strength 9/16: 3/10 pain with dressing   Goal status: MET  4.  Patient will decrease Quick DASH score by > 8 points demonstrating reduced self-reported upper extremity disability for L and R shoulder  Baseline: 7/23: L shoulder 50% 9/16: R=54.54 % L 66% L 81% R 24% 12/10: R: 43.2% L: 56.8% 2/6: L 22%  R 34%  Goal status: MET  5.  Patient will be able to eat an entire meal without resting arm demonstrating improved functional strength  Baseline: 7/23: has to rest while eating due to pain. 9/16: able to make halfway through meal 10/7: can make it about halfway through meal, some meals can get 3/4s through meal 12/10: improved, still has to rest every once in a while 2/6: can get halfway to 2/3rds of the meal.  Goal status: Partially Met  6.  Patient will be able to stand 15 minutes without her LLE going numb allowing for ADLS such as showering and iADLs such as cooking Baseline: 5 minutes 12/10: will assess next visit 2/6: 10 minutes  Goal status: IN PROGRESS  7. Patient will increase six minute walk test distance to >1500 for progression to community ambulator and improve gait ability Baseline: 1160 ft  12/10: 1010 ft 2/6: 1140 ft  Goal status: IN PROGRESS  8  Patient will reduce modified Oswestry score to <20 as to demonstrate minimal disability with ADLs including improved sleeping tolerance, walking/sitting tolerance etc for better mobility with ADLs.  Baseline: 2/6: 60% Goal status: New PLAN:  PT FREQUENCY: 2x/week  PT DURATION: 8 weeks  PLANNED INTERVENTIONS: Therapeutic exercises, Therapeutic activity, Neuromuscular re-education, Balance training, Gait training, Patient/Family education, Self Care, Joint mobilization, Vestibular training, Canalith repositioning, Visual/preceptual remediation/compensation, Dry Needling, Electrical stimulation, Spinal mobilization, Cryotherapy, Moist heat, Compression bandaging, scar mobilization, Splintting, Taping, Traction, Ultrasound, Biofeedback, Ionotophoresis 4mg /ml Dexamethasone,  Manual therapy, and Re-evaluation  PLAN FOR NEXT SESSION:   Continue with LUE strengthening  L ankle strengthening, distraction, core activation, reassess scaption and flexion ROM, make HEP     Precious Bard PT  Physical Therapist - [facility]  [facility]  02/02/24, 5:18 PM

## 2024-02-06 ENCOUNTER — Ambulatory Visit: Payer: Medicare HMO

## 2024-02-07 ENCOUNTER — Ambulatory Visit: Payer: Medicare HMO | Admitting: Psychology

## 2024-02-07 DIAGNOSIS — F331 Major depressive disorder, recurrent, moderate: Secondary | ICD-10-CM | POA: Diagnosis not present

## 2024-02-07 NOTE — Progress Notes (Signed)
[facility] Counselor/Therapist Progress Note  Patient ID: Rachel Torres, MRN: [id number],    Date: 02/07/2024  Time Spent: 10:00am-10:45am  45 minutes   Treatment Type: Individual Therapy  Reported Symptoms: stress  Mental Status Exam: Appearance:  Casual     Behavior: Appropriate  Motor: Normal  Speech/Language:  Normal Rate  Affect: Appropriate  Mood: normal  Thought process: normal  Thought content:   WNL  Sensory/Perceptual disturbances:   WNL  Orientation: oriented to person, place, time/date, and situation  Attention: Good  Concentration: Good  Memory: WNL  Fund of knowledge:  Good  Insight:   Good  Judgment:  Good  Impulse Control: Good   Risk Assessment: Danger to Self:  No Self-injurious Behavior: No Danger to Others: No Duty to Warn:no Physical Aggression / Violence:No  Access to Firearms a concern: No  Gang Involvement:No   Subjective: Pt present for face-to-face individual therapy via video.  Pt consents to telehealth video session and is aware of limitations and benefits of virtual sessions.  Location of pt: home Location of therapist: home office.  Pt talked about continuing to be sick.  She states she is tired of being sick.  Pt does not have a caregiver for [name] today so her day is stressful.   Pt talked about [name].  Pt states she lost her temper with [name] this week.  Addressed the incident and how pt reacted.  She yelled at [name] and [name] shut down.  Pt feels badly about getting mad at him.  Helped pt process her feelings and relationship dynamics.  Pt gets frustrated that [name] does not do what she needs him to do to be safe and guard against falling.   Pt talked about her uncle passing away recently.   Helped pt process her feelings and grief.   Worked on self care strategies.  Provided supportive therapy.  Interventions: Cognitive Behavioral Therapy and Insight-Oriented  Diagnosis:  F33.1  Plan of Care:  Recommend ongoing therapy.   Pt participated in setting treatment goals.   Pt needs a place to talk about stressors.  Plan to meet every two weeks.    Treatment Plan (target date: 03/23/2024) Client Abilities/Strengths  Pt is bright, engaging, and motivated for therapy.   Client Treatment Preferences  Individual therapy.  Client Statement of Needs  Improve coping skills.  Have a place to talk about stressors.   Symptoms  Depressed or irritable mood. Unresolved grief issues.  Problems Addressed  Unipolar Depression Goals 1. Alleviate depressive symptoms and return to previous level of effective functioning. 2. Appropriately grieve the loss in order to normalize mood and to return to previously adaptive level of functioning. Objective Learn and implement behavioral strategies to overcome depression. Target Date: 2024-03-23 Frequency: Biweekly  Progress: 40 Modality: individual  Related Interventions Engage the client in "behavioral activation," increasing his/her activity level and contact with sources of reward, while identifying processes that inhibit activation.  Use behavioral techniques such as instruction, rehearsal, role-playing, role reversal, as needed, to facilitate activity in the client's daily life; reinforce success. Assist the client in developing skills that increase the likelihood of deriving pleasure from behavioral activation (e.g., assertiveness skills, developing an exercise plan, less internal/more external focus, increased social involvement); reinforce success. Objective Identify important people in life, past and present, and describe the quality, good and poor, of those relationships. Target Date: 2024-03-23 Frequency: Biweekly  Progress: 40 Modality: individual  Related Interventions Conduct Interpersonal Therapy beginning  with the assessment of the client's "interpersonal inventory" of important past and present relationships; develop a case formulation linking  depression to grief, interpersonal role disputes, role transitions, and/or interpersonal deficits). Objective Learn and implement problem-solving and decision-making skills. Target Date: 2024-03-23 Frequency: Biweekly  Progress: 40 Modality: individual  Related Interventions Conduct Problem-Solving Therapy using techniques such as psychoeducation, modeling, and role-playing to teach client problem-solving skills (i.e., defining a problem specifically, generating possible solutions, evaluating the pros and cons of each solution, selecting and implementing a plan of action, evaluating the efficacy of the plan, accepting or revising the plan); role-play application of the problem-solving skill to a real life issue. Encourage in the client the development of a positive problem orientation in which problems and solving them are viewed as a natural part of life and not something to be feared, despaired, or avoided. 3. Develop healthy interpersonal relationships that lead to the alleviation and help prevent the relapse of depression. 4. Develop healthy thinking patterns and beliefs about self, others, and the world that lead to the alleviation and help prevent the relapse of depression. 5. Recognize, accept, and cope with feelings of depression. Diagnosis F33.1  Conditions For Discharge Achievement of treatment goals and objectives   Salomon Fick, LCSW

## 2024-02-08 ENCOUNTER — Encounter: Payer: Self-pay | Admitting: Family Medicine

## 2024-02-08 ENCOUNTER — Telehealth (INDEPENDENT_AMBULATORY_CARE_PROVIDER_SITE_OTHER): Payer: Medicare HMO | Admitting: Family Medicine

## 2024-02-08 DIAGNOSIS — R051 Acute cough: Secondary | ICD-10-CM | POA: Diagnosis not present

## 2024-02-08 MED ORDER — DOXYCYCLINE HYCLATE 100 MG PO TABS: 100.0000 mg | ORAL_TABLET | Freq: Two times a day (BID) | ORAL | 0 refills | Status: DC

## 2024-02-08 MED ORDER — BENZONATATE 200 MG PO CAPS: 200.0000 mg | ORAL_CAPSULE | Freq: Three times a day (TID) | ORAL | 1 refills | Status: DC | PRN

## 2024-02-08 MED ORDER — PROMETHAZINE-DM 6.25-15 MG/5ML PO SYRP
5.0000 mL | ORAL_SOLUTION | Freq: Four times a day (QID) | ORAL | 0 refills | Status: DC | PRN
Start: 2024-02-08 — End: 2024-05-30

## 2024-02-08 NOTE — Progress Notes (Signed)
Virtual Visit via Video Note  I connected with Rachel Torres on 02/08/24 at  8:30 AM EST by a video enabled telemedicine application and verified that I am speaking with the correct person using two identifiers.  Patient Location: Home Provider Location: Office/Clinic  I discussed the limitations, risks, security, and privacy concerns of performing an evaluation and management service by video and the availability of in person appointments. I also discussed with the patient that there may be a patient responsible charge related to this service. The patient expressed understanding and agreed to proceed.  Parties involved in encounter  Patient: Rachel Torres   Provider:  Roxy Manns MD   Subjective: PCP: [name], MD  Chief Complaint  Patient presents with   Cough    Still not feeling well from 01/30/24 visit    HPI Pt presents with uri symptoms /ongoing  Cough since November     Pulse ox 91% RA, now 97%  (something may be wrong with pulse ox)  Often 94% (average)  Pulse rate 95  Last visit with NP Evelene Croon  A/p Covid and flu negative.  Differentials include Viral URI, pnuemonia, bronchitis.    Wheezes heard on auscultation.    Given the cough and sputum production and her medication history, chest x-ray ordered.    Continue mucinex plain.  Refill sent for promethezine-dm (discussed side effects), benzonatate.  Continue albuterol as needed.  Solumedrol 40 mg given in the office (allergy to prednisone, but does ok with solumedrol.    Follow up with PCP if not better or failed to improve.  ER/UC precautions given.       DG Chest 2 View Result Date: 01/30/2024 CLINICAL DATA:  Cough with wheezing for 3 days EXAM: CHEST - 2 VIEW COMPARISON:  09/15/2022 FINDINGS: Lateral view degraded by patient arm position. A lower thoracic moderate wedge deformity is progressive since 09/15/2022, moderate. Right shoulder arthroplasty. Midline trachea. Mild cardiomegaly.  Prior median sternotomy. No pleural effusion or pneumothorax. No congestive failure. Clear lungs. IMPRESSION: No acute process or explanation for patient's symptoms. Mild cardiomegaly without congestive failure. Electronically Signed   By: Jeronimo Greaves M.D.   On: 01/30/2024 16:19    Has been sick since she had covid nov 3 Cough will not go away  No fever Wheezing -worse in the evenings  Shot did not help much  Cough is productive/sometimes not  Phlegm - sometimes white foamy/occational green   Sinuses are congested Using flonase and some saline irrigations   Clear nasal drainage   House is dusty from renovation   Almost out of the prometh dm  Taking plain mucinex  Drinking fluids  Does not think she is in heart failure     ROS: Per HPI Review of Systems  Constitutional:  Positive for malaise/fatigue. Negative for chills and fever.  HENT:  Positive for congestion. Negative for ear pain, sinus pain and sore throat.   Eyes:  Negative for blurred vision, discharge and redness.  Respiratory:  Positive for cough, sputum production, shortness of breath and wheezing. Negative for stridor.   Cardiovascular:  Negative for chest pain, palpitations and leg swelling.  Gastrointestinal:  Negative for abdominal pain, diarrhea, nausea and vomiting.  Musculoskeletal:  Negative for myalgias.  Skin:  Negative for rash.  Neurological:  Negative for dizziness and headaches.     Current Outpatient Medications:    acetaminophen (TYLENOL) 500 MG tablet, Take 500 mg by mouth every 6 (six) hours as needed., Disp: ,  Rfl:    albuterol (VENTOLIN HFA) 108 (90 Base) MCG/ACT inhaler, Inhale 1-2 puffs into the lungs every 6 (six) hours as needed for wheezing or shortness of breath., Disp: 3 each, Rfl: 3   ALPRAZolam (XANAX) 0.5 MG tablet, Take 1 tablet (0.5 mg total) by mouth daily as needed for anxiety., Disp: 30 tablet, Rfl: 0   aspirin EC 81 MG tablet, Take 81 mg by mouth at bedtime., Disp: , Rfl:     atorvastatin (LIPITOR) 20 MG tablet, Take 20 mg by mouth. Four days per week, Disp: , Rfl:    Calcium Carbonate (CALCIUM 600 PO), Take 600 mg by mouth., Disp: , Rfl:    carvedilol (COREG) 12.5 MG tablet, Take 1 tablet (12.5 mg total) by mouth 2 (two) times daily., Disp: 180 tablet, Rfl: 0   cetirizine (ZYRTEC) 10 MG tablet, Take 1 tablet (10 mg total) by mouth daily as needed for allergies., Disp: 90 tablet, Rfl: 3   Cholecalciferol (VITAMIN D3) 50 MCG (2000 UT) CAPS, Take 2,000 capsules by mouth. FOUR TIMES A WEEK, Disp: , Rfl:    cyclobenzaprine (FLEXERIL) 10 MG tablet, Take 0.5-1 tablets (5-10 mg total) by mouth 3 (three) times daily as needed. For headache, Disp: 90 tablet, Rfl: 3   doxycycline (VIBRA-TABS) 100 MG tablet, Take 1 tablet (100 mg total) by mouth 2 (two) times daily., Disp: 14 tablet, Rfl: 0   ELIQUIS 5 MG TABS tablet, Take 1 tablet (5 mg total) by mouth 2 (two) times daily., Disp: 180 tablet, Rfl: 3   ezetimibe (ZETIA) 10 MG tablet, TAKE 1 TABLET EVERY DAY, Disp: 90 tablet, Rfl: 3   fluticasone (FLONASE) 50 MCG/ACT nasal spray, Place 2 sprays into both nostrils daily as needed for allergies or rhinitis., Disp: 48 g, Rfl: 3   furosemide (LASIX) 20 MG tablet, Take 2 tablets (40 mg total) by mouth daily., Disp: 180 tablet, Rfl: 3   isosorbide mononitrate (IMDUR) 30 MG 24 hr tablet, TAKE 1/2 TABLET EVERY DAY, Disp: 45 tablet, Rfl: 3   lisinopril (ZESTRIL) 5 MG tablet, Take 0.5 tablets (2.5 mg total) by mouth daily., Disp: 90 tablet, Rfl: 3   magnesium gluconate (MAGONATE) 500 MG tablet, Take 500 mg by mouth at bedtime., Disp: , Rfl:    nitroGLYCERIN (NITROSTAT) 0.4 MG SL tablet, Place 1 tablet (0.4 mg total) under the tongue every 5 (five) minutes as needed for chest pain., Disp: 25 tablet, Rfl: 3   omeprazole (PRILOSEC) 40 MG capsule, TAKE 1 CAPSULE EVERY DAY, Disp: 90 capsule, Rfl: 1   polyethylene glycol powder (GLYCOLAX/MIRALAX) 17 GM/SCOOP powder, Take 17 g by mouth as needed.,  Disp: 255 g, Rfl: 0   potassium chloride (KLOR-CON M) 10 MEQ tablet, Take 1 tablet (10 mEq total) by mouth 2 (two) times daily., Disp: 180 tablet, Rfl: 3   promethazine (PHENERGAN) 25 MG tablet, Take 0.5 tablets (12.5 mg total) by mouth every 8 (eight) hours as needed for nausea., Disp: 90 tablet, Rfl: 3   Spacer/Aero-Holding Chambers (COMPACT SPACE CHAMBER) DEVI, USE WITH INHALER, Disp: 1 each, Rfl: 0   TART CHERRY PO, Take 1 capsule by mouth 2 (two) times daily., Disp: , Rfl:    topiramate (TOPAMAX) 50 MG tablet, Take 1 tablet (50 mg total) by mouth at bedtime., Disp: 30 tablet, Rfl: 1   venlafaxine XR (EFFEXOR-XR) 150 MG 24 hr capsule, TAKE 1 CAPSULE EVERY DAY WITH BREAKFAST, Disp: 90 capsule, Rfl: 2   venlafaxine XR (EFFEXOR-XR) 37.5 MG 24 hr capsule, Take  1 capsule (37.5 mg total) by mouth daily with breakfast., Disp: 90 capsule, Rfl: 2   benzonatate (TESSALON) 200 MG capsule, Take 1 capsule (200 mg total) by mouth 3 (three) times daily as needed for cough. Swallow whole, Disp: 30 capsule, Rfl: 1   promethazine-dextromethorphan (PROMETHAZINE-DM) 6.25-15 MG/5ML syrup, Take 5 mLs by mouth 4 (four) times daily as needed., Disp: 118 mL, Rfl: 0  Observations/Objective: Today's Vitals   02/08/24 0816  Pulse: 95  SpO2: 91%   Physical Exam Patient appears well, in no distress Weight is baseline  No facial swelling or asymmetry, no facial tenderness Voice is mildly hoarse  No obvious tremor or mobility impairment Moving neck and UEs normally Able to hear the call well  No cough or shortness of breath during interview , no audible wheezing  Talkative and mentally sharp with no cognitive changes No skin changes on face or neck , no rash or pallor Affect is normal    Assessment and Plan: Acute cough Assessment & Plan: Suspect post viral cough syndrome- started with covid in November followed by another viral illness Allergic to narcotics and tramadol and prednisone so symptom options are  limited  Reviewed notes and xray findings from NP Evelene Croon on 2/11 Today-cough continues with a little hoarseness , some shortness of breath (pulse ox machine may not be accurate) and occational green phlegm In light of length of illness will prescription doxycycline  Refill prometh dm and tessalon Continue expectorant  Watch for fever or worse wheezing or shortness of breath  Update if not starting to improve in a week or if worsening  Call back and Er precautions noted in detail today     Orders: -     Promethazine-DM; Take 5 mLs by mouth 4 (four) times daily as needed.  Dispense: 118 mL; Refill: 0 -     Benzonatate; Take 1 capsule (200 mg total) by mouth 3 (three) times daily as needed for cough. Swallow whole  Dispense: 30 capsule; Refill: 1  Other orders -     Doxycycline Hyclate; Take 1 tablet (100 mg total) by mouth 2 (two) times daily.  Dispense: 14 tablet; Refill: 0    Follow Up Instructions: No follow-ups on file. Keep up fluids and rest when you can  Continue expectorant if helpful    Take doxycycline as directed for possible bacterial infection  I refilled prometh dm and also tessalon to try and stop the cough cycle  If any severe symptoms, go to the ER  Update if not starting to improve in a week or if worsening     I discussed the assessment and treatment plan with the patient. The patient was provided an opportunity to ask questions, and all were answered. The patient agreed with the plan and demonstrated an understanding of the instructions.   The patient was advised to call back or seek an in-person evaluation if the symptoms worsen or if the condition fails to improve as anticipated.  The above assessment and management plan was discussed with the patient. The patient verbalized understanding of and has agreed to the management plan.   Roxy Manns, MD

## 2024-02-08 NOTE — Patient Instructions (Signed)
Keep up fluids and rest when you can  Continue expectorant if helpful    Take doxycycline as directed for possible bacterial infection  I refilled prometh dm and also tessalon to try and stop the cough cycle  If any severe symptoms, go to the ER  Update if not starting to improve in a week or if worsening

## 2024-02-08 NOTE — Assessment & Plan Note (Signed)
Suspect post viral cough syndrome- started with covid in November followed by another viral illness Allergic to narcotics and tramadol and prednisone so symptom options are limited  Reviewed notes and xray findings from NP Evelene Croon on 2/11 Today-cough continues with a little hoarseness , some shortness of breath (pulse ox machine may not be accurate) and occational green phlegm In light of length of illness will prescription doxycycline  Refill prometh dm and tessalon Continue expectorant  Watch for fever or worse wheezing or shortness of breath  Update if not starting to improve in a week or if worsening  Call back and Er precautions noted in detail today

## 2024-02-09 ENCOUNTER — Ambulatory Visit: Payer: Medicare HMO

## 2024-02-13 ENCOUNTER — Ambulatory Visit: Payer: Medicare HMO

## 2024-02-14 ENCOUNTER — Ambulatory Visit: Payer: Medicare HMO

## 2024-02-14 ENCOUNTER — Ambulatory Visit: Payer: Medicare HMO | Admitting: Psychology

## 2024-02-14 DIAGNOSIS — F331 Major depressive disorder, recurrent, moderate: Secondary | ICD-10-CM

## 2024-02-14 NOTE — Therapy (Incomplete)
OUTPATIENT PHYSICAL THERAPY TREATMENT/ Physical Therapy Progress Note   Dates of reporting period  09/26/23   to   02/14/24      Patient Name: Rachel Torres MRN: [id number] DOB:Aug 22, 1956, 67 y.o., female Today's Date: 02/14/2024  END OF SESSION:                 Past Medical History:  Diagnosis Date   AAA (abdominal aortic aneurysm) (HCC)    a. Chronic w/o evidence of aneurysmal dil on CTA 01/2016.   Alcohol abuse    Allergy    Anemia    Anxiety    Asthma    CAD (coronary artery disease)    a. 08/2010 s/p CABG x 1 (VG->RCA) @ time of Ao dissection repair; b. 01/2016 Lexiscan MV: mid antsept/apical defect w/ ? peri-infarct ischemia-->likely attenuation-->Med Rx.   Chewing difficulty    Chronic combined systolic (congestive) and diastolic (congestive) heart failure (HCC)    a. 2012 EF 30-35%; b. 04/2015 EF 40-45%; c. 01/2016 Echo: Ef 55-65%, nl AoV bioprosthesis, nl RV, nl PASP.   Constipation    Depression    Edema of both lower extremities    Gallbladder sludge    GERD (gastroesophageal reflux disease)    Heart failure (HCC)    Heart valve problem    History of Bicuspid Aortic Valve    a. 08/2010 s/p AVR @ time of Ao dissection repair; b. 01/2016 Echo: Ef 55-65%, nl AoV bioprosthesis, nl RV, nl PASP.   Hx of repair of dissecting thoracic aortic aneurysm, Stanford type A    a. 08/2010 s/p repair with AVR and VG->RCA; b. 01/2016 CTA: stable appearance of Asc Thoracic Aortic graft. Opacification of flase lumen of chronic abd Ao dissection w/ retrograde flow through lumbar arteries. No aneurysmal dil of flase lumen.   Hyperlipidemia    Hypertension    Hypertensive heart disease    IBS (irritable bowel syndrome)    Internal hemorrhoids    Kidney problem    Marfan syndrome    pt denies   Migraine    Obstructive sleep apnea    Osteoarthritis    Osteoporosis    Palpitations    Pneumonia    RA (rheumatoid arthritis) (HCC)    a. Followed by Dr. Mallie Mussel   Past  Surgical History:  Procedure Laterality Date   ABDOMINAL AORTIC ANEURYSM REPAIR     CARDIAC CATHETERIZATION  2001   CESAREAN SECTION  1986 & 1991   CHOLECYSTECTOMY     COLONOSCOPY     COLONOSCOPY WITH PROPOFOL N/A 01/19/2024   Procedure: COLONOSCOPY WITH PROPOFOL;  Surgeon: Wyline Mood, MD;  Location: [facility] ENDOSCOPY;  Service: Gastroenterology;  Laterality: N/A;   ESOPHAGOGASTRODUODENOSCOPY (EGD) WITH PROPOFOL N/A 05/08/2020   Procedure: ESOPHAGOGASTRODUODENOSCOPY (EGD) WITH PROPOFOL;  Surgeon: Pasty Spillers, MD;  Location: [facility] ENDOSCOPY;  Service: Endoscopy;  Laterality: N/A;   FRACTURE SURGERY Right    shoulder replacement   HEMORRHOID BANDING     ORIF DISTAL RADIUS FRACTURE Left    POLYPECTOMY  01/19/2024   Procedure: POLYPECTOMY;  Surgeon: Wyline Mood, MD;  Location: [facility] ENDOSCOPY;  Service: Gastroenterology;;   UPPER GASTROINTESTINAL ENDOSCOPY     Patient Active Problem List   Diagnosis Date Noted   Adenomatous polyp of colon 01/19/2024   Heart failure with improved ejection fraction (HFimpEF) (HCC) 01/18/2024   Abnormal metabolism 11/14/2023   Weight regain after inital weight loss 11/14/2023   Multiple closed tarsal fractures of left foot, sequela 07/12/2023   Arm  contusion, right, sequela 07/12/2023   Lumbar disc disease 07/12/2023   PAF (paroxysmal atrial fibrillation) (HCC) 06/09/2023   Paresthesia 06/03/2023   Obesity, Beginning BMI 35.45 02/17/2023   Tinnitus 01/14/2023   Change in vision 01/14/2023   Encounter for screening colonoscopy 01/14/2023   Personal history of fall 01/14/2023   Problem related to unspecified psychosocial circumstances 12/28/2022   Person encountering health services to consult on behalf of another person 12/28/2022   Palpitation 12/10/2022   Fatigue 10/26/2022   B12 deficiency 10/26/2022   Right shoulder pain 09/15/2022   Depression 09/14/2022   Other constipation 05/12/2022   Medicare annual wellness visit, initial 07/17/2021    TMJ (dislocation of temporomandibular joint) 07/17/2021   Encounter for screening mammogram for breast cancer 06/05/2021   Diarrhea 04/11/2020   Epigastric pain 02/19/2020   Dark stools 02/19/2020   History of atrial fibrillation 02/05/2019   Shortness of breath 01/27/2019   Irregular surface of cornea 12/25/2018   Acute cough 09/08/2017   History of HPV infection 12/01/2016   Screening mammogram, encounter for 11/29/2016   Estrogen deficiency 11/29/2016   Encounter for routine gynecological examination 11/29/2016   Grade III hemorrhoids 10/06/2016   Tachycardia 08/14/2016   Hemorrhoids 08/13/2016   Atypical migraine 08/03/2016   CAD (coronary artery disease)    AAA (abdominal aortic aneurysm) (HCC)    Chronic combined systolic (congestive) and diastolic (congestive) heart failure (HCC)    Prediabetes 02/11/2016   Generalized anxiety disorder 12/08/2015   Panic attacks 07/28/2015   Chest pain 07/09/2013   Sensorineural hearing loss 03/27/2013   Vitamin D deficiency 07/03/2012   H/O aortic dissection 05/12/2012   Routine general medical examination at a health care facility 09/20/2011   Atypical chest pain 09/15/2011   S/P AVR (aortic valve replacement) 01/07/2011   CORONARY ARTERY BYPASS GRAFT, HX OF 01/07/2011   Arthritis 12/20/2010   Back pain 12/15/2010   H/O dissecting abdominal aortic aneurysm repair 08/24/2010   IRRITABLE BOWEL SYNDROME 09/09/2009   Obstructive sleep apnea 08/04/2009   External hemorrhoid 06/26/2009   Osteoporosis 01/09/2009   Hyperlipidemia 07/26/2007   Depression with anxiety 07/26/2007   Essential hypertension 07/26/2007   Allergic rhinitis 07/26/2007   Asthma 07/26/2007   GERD 07/26/2007   Osteoarthritis 07/26/2007   Ocular migraine 07/26/2007   Mild intermittent asthma without complication 07/26/2007    PCP: [name] MD   REFERRING PROVIDER: Stormy Card  REFERRING DIAG: L rotator cuff   THERAPY DIAG:  No diagnosis  found.  Rationale for Evaluation and Treatment: Rehabilitation  ONSET DATE: Feb 2024  SUBJECTIVE:  SUBJECTIVE STATEMENT: ***  Hand dominance: Left  PERTINENT HISTORY: Rachel Torres is a 66yoF familiar to our services. Pt referred to PT for strengthening of chronic left shoulder DJD, known remote RC injury. Recently fell and broke four metatarsals, since cleared from use of CAM rocker. She was treated successfully in past for right shoulder pain and has past medical history of Right shoulder surgery replacement. 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 PMH aorta type A dissection in sept 2011 s/p aortic valve replacement, bypass of R coronary artery, anemia, anxiety, CAD, CHF, depression, GERD, Heart failure, HLD, HTN, IBS, migraine, osteoarthritis, osteoporosis, RA, back pain.    Back pain:  Patient has new referral for radiculopathy, lumbar region. MRI showed degeneration of L1-2 and L2-3 with some disc space collapse and minor retrolisthesis of L2-3. Issue began in February, felt numbness in L thigh. No cause known, but she was at a museum and noticed it was numb. Sometimes L foot numbness and toe numbness of 2nd and 3rd toe causing tripping. This caused her to trip July 4th and break five metatarsals. Some minor pain in the low pain noted. Pain noted to be 2-3/10 Pain in hip 3-4/10 of L hip.    PAIN:  Are you having pain? Yes, 3/10 pain in lateral deltoids in the shoulder.  PRECAUTIONS: None  FALLS:  Has patient fallen in last 6 months? Yes. Number of falls 2  PATIENT GOALS:to lift more than 3lb with LUE, be able to eat a meal without arm being tired.    OBJECTIVE:  DIAGNOSTIC FINDINGS:  IMPRESSION: Mild degenerative change in the cervical spine without acute fracture or traumatic subluxation.    IMPRESSION: Full width, full width tears of the supraspinatus and infraspinatus tendons with retraction to the glenoid. Grade 2/3 infraspinatus atrophy.   Moderate tendinosis of the distal subscapularis tendon with articular sided fraying of the cephalad fibers.   Moderate intra-articular long head biceps tendinosis with fraying and possible partial tearing.   Mild glenohumeral and moderate AC joint osteoarthritis.   Degenerative superior labral tearing anteriorly and posteriorly.   PATIENT SURVEYS:  Quick Dash 50%  and FOTO 46%   COGNITION: Overall cognitive status: Within functional limits for tasks assessed                                  SENSATION: WFL   POSTURE: Slight rounded shoulders and shoulder elevation    UPPER EXTREMITY ROM:    Active ROM Right eval 9/16: recert RUE Left eval 9/16 RECERT LUE  Shoulder flexion 64 122 91 92  Shoulder extension        Shoulder abduction 95 88 52 82  Shoulder adduction        Shoulder internal rotation Unable to test at 90 61 degrees 5 40  Shoulder external rotation 18at side of body -5 degrees at 90 degrees 34 35  Elbow flexion [facility]  WFL   Elbow extension [facility]  WFL   Wrist flexion        Wrist extension        Wrist ulnar deviation        Wrist radial deviation        Wrist pronation        Wrist supination        (Blank rows = not tested)   UPPER EXTREMITY MMT:   MMT Right eval Left eval  Shoulder flexion  Shoulder extension      Shoulder abduction      Shoulder adduction      Shoulder internal rotation      Shoulder external rotation      Middle trapezius      Lower trapezius      Elbow flexion      Elbow extension      Wrist flexion      Wrist extension      Wrist ulnar deviation      Wrist radial deviation      Wrist pronation      Wrist supination      Grip strength (lbs) 30 35  (Blank rows = not tested) Unable to test strength due to pain with motion   TRUNK ROM: Trunk Flexion 65  Trunk  Extension 7  Trunk R SB Limited 25%  Trunk L SB WFL  Trunk R rotation WFL  Trunk L rotation Limited 25%    Ankle ROM Seated: Motion: AROM seated Right Left  Plantarflexion 4+ 3  Dorsiflexion 4+ 4  Inversion 4 3+  Eversion 4+ 3+    SHOULDER SPECIAL TESTS: Impingement tests:  unable to test due to pain in starting position SLAP lesions:  unable to test due to pain in starting position Instability tests:  unable to test due to pain in starting position Rotator cuff assessment:  unable to test due to pain in starting position Biceps assessment:  unable to test due to pain in starting position   LOW BACK SPECIAL TEST: SLR: negative  Scour test: negative FABER: negative Pelvic compression/distraction: negative  Slump test positive LLE  6 min walk test: 1160 ft increased left foot drag and antalgic gait pattern with foot eversion    JOINT MOBILITY TESTING:  AP: painful PA: painful Inferior: painful Distraction: reduces pain   Back mobilization hypomobile and painful to grade II mobilizations    PALPATION:  Tenderness to all joint regions of GH     TODAY'S TREATMENT: DATE: 02/14/24  Physical therapy treatment session today consisted of completing assessment of goals and administration of testing as demonstrated and documented in flow sheet, treatment, and goals section of this note. Addition treatments may be found below.   Manual:  Grade II mobilizations thoracic and lumbar spine x 8 minutes  Therex: Supine: -posterior pelvic tilt 10x -thoracic rotation 10x  -nerve glide 15x each LE -single knee to chest 60 seconds each LE -figure four stretch 60 seconds each LE -GTB abduction 15x  Seated:  -mini rollout swiss ball 15x -breathing 360 x multiple reps  -GTB hamstring curl 15x -GTB PF press 15   PATIENT EDUCATION: Education details: goals, POC  Person educated: Patient Education method: Explanation, Demonstration, Tactile cues, Verbal cues, and  Handouts Education comprehension: verbalized understanding, returned demonstration, verbal cues required, and tactile cues required  HOME EXERCISE PROGRAM:  Access Code: [email or web address] URL: [email or web address] Date: 10/03/2023 Prepared by: Precious Bard  Exercises - Seated Scapular Retraction  - 1 x daily - 7 x weekly - 2 sets - 10 reps - 5 hold - Neck Sidebending  - 1 x daily - 7 x weekly - 2 sets - 2 reps - 30 hold - Supine Isometric Shoulder Extension with Towel  - 1 x daily - 7 x weekly - 2 sets - 10 reps - 5 hold - Supine Isometric Shoulder Adduction with Towel Roll  - 1 x daily - 7 x weekly - 2 sets - 10 reps - 5 hold - Seated  Bilateral Shoulder Flexion Towel Slide at Table Top  - 1 x daily - 7 x weekly - 1 sets - 3 reps - 30 hold - Supine Posterior Pelvic Tilt  - 1 x daily - 7 x weekly - 2 sets - 5 reps - 30 hold - Supine LE Neural Mobilization  - 1 x daily - 7 x weekly - 2 sets - 10 reps - 5 hold - Seated Ankle Alphabet  - 1 x daily - 7 x weekly - 2 sets - 1 reps - 5 hold  ASSESSMENT:  CLINICAL IMPRESSION:  Goals performed previous session on 01/26/24, please refer to this note for further details. Patient's condition has the potential to improve in response to therapy. Maximum improvement is yet to be obtained. The anticipated improvement is attainable and reasonable in a generally predictable time.  ***   Pt will continue to benefit from skilled therapy to address remaining deficits in order to improve overall QoL and return to PLOF.      OBJECTIVE IMPAIRMENTS: cardiopulmonary status limiting activity, decreased activity tolerance, decreased mobility, increased fascial restrictions, impaired perceived functional ability, increased muscle spasms, impaired flexibility, impaired UE functional use, improper body mechanics, postural dysfunction, and pain.   ACTIVITY LIMITATIONS: carrying, lifting, bed mobility, bathing, dressing, self feeding, reach over head, and caring for  others  PARTICIPATION LIMITATIONS: meal prep, cleaning, laundry, personal finances, interpersonal relationship, driving, shopping, community activity, yard work, church, and caregiving for husband  PERSONAL FACTORS: Age, Fitness, Past/current experiences, Time since onset of injury/illness/exacerbation, and 3+ comorbidities: aorta type A dissection in sept 2011 with aortic valve replacement bypass of R coronary artery, AAA, anemia, anxiety, CAD, CHF, depression, GERD, Heart failure, HLD, HTN, IBS, Kidney problems, migraine, osteoarthritis, osteoporosis, RA, ocular migraines, back pain  are also affecting patient's functional outcome.   REHAB POTENTIAL: Fair    CLINICAL DECISION MAKING: Evolving/moderate complexity  EVALUATION COMPLEXITY: Moderate   GOALS: Goals reviewed with patient? Yes  SHORT TERM GOALS: Target date: 12/27/2023  Patient will be independent in home exercise program to improve strength/mobility for better functional independence with ADLs. Baseline:7/23: HEP given 9/16: compliant 1x/week  Goal status: On going     LONG TERM GOALS: Target date: 03/22/2024   Patient will increase FOTO score to equal to or greater than  57%   to demonstrate statistically significant improvement in mobility and quality of life.  Baseline: 7/22: 46 9/16: 50% 10/7: 40% 12/10: 57% Goal status: MET  2.  Patient will improve shoulder AROM to > 140 degrees of flexion, scaption, and abduction for improved ability to perform overhead activities. Baseline: 7/22: see above 9/16: see above 10/7 : defer to exacerbation 12/10: shoulder abduction >140, flexion < 140 , assess scaption next visit 2/6: flexion >140, abduction >140 scaption >140 Goal status: MET  3.  Patient will demonstrate adequate shoulder ROM and strength to be able to shave and dress independently with pain less than 3/10. Baseline: 7/23: 6/10 pain with dressing, limited ROM, unable to test strength 9/16: 3/10 pain with dressing   Goal status: MET  4.  Patient will decrease Quick DASH score by > 8 points demonstrating reduced self-reported upper extremity disability for L and R shoulder  Baseline: 7/23: L shoulder 50% 9/16: R=54.54 % L 66% L 81% R 24% 12/10: R: 43.2% L: 56.8% 2/6: L 22%  R 34%  Goal status: MET  5.  Patient will be able to eat an entire meal without resting arm demonstrating improved functional strength  Baseline: 7/23: has to rest while eating due to pain. 9/16: able to make halfway through meal 10/7: can make it about halfway through meal, some meals can get 3/4s through meal 12/10: improved, still has to rest every once in a while 2/6: can get halfway to 2/3rds of the meal.  Goal status: Partially Met  6.  Patient will be able to stand 15 minutes without her LLE going numb allowing for ADLS such as showering and iADLs such as cooking Baseline: 5 minutes 12/10: will assess next visit 2/6: 10 minutes  Goal status: IN PROGRESS  7. Patient will increase six minute walk test distance to >1500 for progression to community ambulator and improve gait ability Baseline: 1160 ft  12/10: 1010 ft 2/6: 1140 ft  Goal status: IN PROGRESS  8  Patient will reduce modified Oswestry score to <20 as to demonstrate minimal disability with ADLs including improved sleeping tolerance, walking/sitting tolerance etc for better mobility with ADLs.  Baseline: 2/6: 60% Goal status: New PLAN:  PT FREQUENCY: 2x/week  PT DURATION: 8 weeks  PLANNED INTERVENTIONS: Therapeutic exercises, Therapeutic activity, Neuromuscular re-education, Balance training, Gait training, Patient/Family education, Self Care, Joint mobilization, Vestibular training, Canalith repositioning, Visual/preceptual remediation/compensation, Dry Needling, Electrical stimulation, Spinal mobilization, Cryotherapy, Moist heat, Compression bandaging, scar mobilization, Splintting, Taping, Traction, Ultrasound, Biofeedback, Ionotophoresis 4mg /ml Dexamethasone,  Manual therapy, and Re-evaluation  PLAN FOR NEXT SESSION:   Continue with LUE strengthening  L ankle strengthening, distraction, core activation, reassess scaption and flexion ROM, make HEP     Lenda Kelp PT  Physical Therapist - [facility]  [facility]  02/14/24, 1:09 PM

## 2024-02-14 NOTE — Progress Notes (Signed)
 [facility] Counselor/Therapist Progress Note  Patient ID: Rachel Torres, MRN: [id number],    Date: 02/14/2024  Time Spent: 10:00am-10:50am  50 minutes   Treatment Type: Individual Therapy  Reported Symptoms: stress  Mental Status Exam: Appearance:  Casual     Behavior: Appropriate  Motor: Normal  Speech/Language:  Normal Rate  Affect: Appropriate  Mood: normal  Thought process: normal  Thought content:   WNL  Sensory/Perceptual disturbances:   WNL  Orientation: oriented to person, place, time/date, and situation  Attention: Good  Concentration: Good  Memory: WNL  Fund of knowledge:  Good  Insight:   Good  Judgment:  Good  Impulse Control: Good   Risk Assessment: Danger to Self:  No Self-injurious Behavior: No Danger to Others: No Duty to Warn:no Physical Aggression / Violence:No  Access to Firearms a concern: No  Gang Involvement:No   Subjective: Pt present for face-to-face individual therapy via video.  Pt consents to telehealth video session and is aware of limitations and benefits of virtual sessions.  Location of pt: home Location of therapist: home office.  Pt talked about Rachel Torres spending the day with Fayrene Fearing today to give pt a break.   Pt plans to take advantage of having the house to herself today.   Pt needs the break bc caregiving has been very stressful.   Rachel Torres can be challenging bc of his cognitive deficits.  Helped pt problem solve how to manage various behaviors.  Worked on caregiver stress.  Pt talked about her health.   She is having dental issues and has to have a couple of extractions and implants.   Pt is not looking forward to dealing with the dental work.   Worked on self care strategies.  Provided supportive therapy.  Interventions: Cognitive Behavioral Therapy and Insight-Oriented  Diagnosis:  F33.1  Plan of Care: Recommend ongoing therapy.   Pt participated in setting treatment goals.   Pt needs a place to talk about  stressors.  Plan to meet every two weeks.    Treatment Plan (target date: 03/23/2024) Client Abilities/Strengths  Pt is bright, engaging, and motivated for therapy.   Client Treatment Preferences  Individual therapy.  Client Statement of Needs  Improve coping skills.  Have a place to talk about stressors.   Symptoms  Depressed or irritable mood. Unresolved grief issues.  Problems Addressed  Unipolar Depression Goals 1. Alleviate depressive symptoms and return to previous level of effective functioning. 2. Appropriately grieve the loss in order to normalize mood and to return to previously adaptive level of functioning. Objective Learn and implement behavioral strategies to overcome depression. Target Date: 2024-03-23 Frequency: Biweekly  Progress: 40 Modality: individual  Related Interventions Engage the client in "behavioral activation," increasing his/her activity level and contact with sources of reward, while identifying processes that inhibit activation.  Use behavioral techniques such as instruction, rehearsal, role-playing, role reversal, as needed, to facilitate activity in the client's daily life; reinforce success. Assist the client in developing skills that increase the likelihood of deriving pleasure from behavioral activation (e.g., assertiveness skills, developing an exercise plan, less internal/more external focus, increased social involvement); reinforce success. Objective Identify important people in life, past and present, and describe the quality, good and poor, of those relationships. Target Date: 2024-03-23 Frequency: Biweekly  Progress: 40 Modality: individual  Related Interventions Conduct Interpersonal Therapy beginning with the assessment of the client's "interpersonal inventory" of important past and present relationships; develop a case formulation linking depression to grief, interpersonal  role disputes, role transitions, and/or interpersonal  deficits). Objective Learn and implement problem-solving and decision-making skills. Target Date: 2024-03-23 Frequency: Biweekly  Progress: 40 Modality: individual  Related Interventions Conduct Problem-Solving Therapy using techniques such as psychoeducation, modeling, and role-playing to teach client problem-solving skills (i.e., defining a problem specifically, generating possible solutions, evaluating the pros and cons of each solution, selecting and implementing a plan of action, evaluating the efficacy of the plan, accepting or revising the plan); role-play application of the problem-solving skill to a real life issue. Encourage in the client the development of a positive problem orientation in which problems and solving them are viewed as a natural part of life and not something to be feared, despaired, or avoided. 3. Develop healthy interpersonal relationships that lead to the alleviation and help prevent the relapse of depression. 4. Develop healthy thinking patterns and beliefs about self, others, and the world that lead to the alleviation and help prevent the relapse of depression. 5. Recognize, accept, and cope with feelings of depression. Diagnosis F33.1  Conditions For Discharge Achievement of treatment goals and objectives   Salomon Fick, LCSW

## 2024-02-15 NOTE — Therapy (Signed)
 OUTPATIENT PHYSICAL THERAPY TREATMENT/ Physical Therapy Progress Note   Dates of reporting period  09/26/23   to   02/16/24      Patient Name: Rachel Torres MRN: [id number] DOB:[birth year], 68 y.o., female Today's Date: 02/16/2024  END OF SESSION:  PT End of Session - 02/16/24 1358     Visit Number 20    Number of Visits 35    Date for PT Re-Evaluation 03/22/24    Authorization Type [employer]  Medicare HMO    Authorization Time Period 09/05/23-10/31/23    Progress Note Due on Visit 10    PT Start Time 1400    PT Stop Time 1444    PT Time Calculation (min) 44 min    Equipment Utilized During Treatment Gait belt    Activity Tolerance Patient tolerated treatment well    Behavior During Therapy WFL for tasks assessed/performed                           Past Medical History:  Diagnosis Date   AAA (abdominal aortic aneurysm) (HCC)    a. Chronic w/o evidence of aneurysmal dil on CTA 01/2016.   Alcohol abuse    Allergy    Anemia    Anxiety    Asthma    CAD (coronary artery disease)    a. 08/2010 s/p CABG x 1 (VG->RCA) @ time of Ao dissection repair; b. 01/2016 Lexiscan MV: mid antsept/apical defect w/ ? peri-infarct ischemia-->likely attenuation-->Med Rx.   Chewing difficulty    Chronic combined systolic (congestive) and diastolic (congestive) heart failure (HCC)    a. 2012 EF 30-35%; b. 04/2015 EF 40-45%; c. 01/2016 Echo: Ef 55-65%, nl AoV bioprosthesis, nl RV, nl PASP.   Constipation    Depression    Edema of both lower extremities    Gallbladder sludge    GERD (gastroesophageal reflux disease)    Heart failure (HCC)    Heart valve problem    History of Bicuspid Aortic Valve    a. 08/2010 s/p AVR @ time of Ao dissection repair; b. 01/2016 Echo: Ef 55-65%, nl AoV bioprosthesis, nl RV, nl PASP.   Hx of repair of dissecting thoracic aortic aneurysm, Stanford type A    a. 08/2010 s/p repair with AVR and VG->RCA; b. 01/2016 CTA: stable appearance of Asc Thoracic  Aortic graft. Opacification of flase lumen of chronic abd Ao dissection w/ retrograde flow through lumbar arteries. No aneurysmal dil of flase lumen.   Hyperlipidemia    Hypertension    Hypertensive heart disease    IBS (irritable bowel syndrome)    Internal hemorrhoids    Kidney problem    Marfan syndrome    pt denies   Migraine    Obstructive sleep apnea    Osteoarthritis    Osteoporosis    Palpitations    Pneumonia    RA (rheumatoid arthritis) (HCC)    a. Followed by Dr. Mallie Mussel   Past Surgical History:  Procedure Laterality Date   ABDOMINAL AORTIC ANEURYSM REPAIR     CARDIAC CATHETERIZATION  2001   CESAREAN SECTION  1986 & 1991   CHOLECYSTECTOMY     COLONOSCOPY     COLONOSCOPY WITH PROPOFOL N/A 01/19/2024   Procedure: COLONOSCOPY WITH PROPOFOL;  Surgeon: Wyline Mood, MD;  Location: [facility] ENDOSCOPY;  Service: Gastroenterology;  Laterality: N/A;   ESOPHAGOGASTRODUODENOSCOPY (EGD) WITH PROPOFOL N/A 05/08/2020   Procedure: ESOPHAGOGASTRODUODENOSCOPY (EGD) WITH PROPOFOL;  Surgeon: Pasty Spillers, MD;  Location: [facility]  ENDOSCOPY;  Service: Endoscopy;  Laterality: N/A;   FRACTURE SURGERY Right    shoulder replacement   HEMORRHOID BANDING     ORIF DISTAL RADIUS FRACTURE Left    POLYPECTOMY  01/19/2024   Procedure: POLYPECTOMY;  Surgeon: Wyline Mood, MD;  Location: [facility] ENDOSCOPY;  Service: Gastroenterology;;   UPPER GASTROINTESTINAL ENDOSCOPY     Patient Active Problem List   Diagnosis Date Noted   Adenomatous polyp of colon 01/19/2024   Heart failure with improved ejection fraction (HFimpEF) (HCC) 01/18/2024   Abnormal metabolism 11/14/2023   Weight regain after inital weight loss 11/14/2023   Multiple closed tarsal fractures of left foot, sequela 07/12/2023   Arm contusion, right, sequela 07/12/2023   Lumbar disc disease 07/12/2023   PAF (paroxysmal atrial fibrillation) (HCC) 06/09/2023   Paresthesia 06/03/2023   Obesity, Beginning BMI 35.45 02/17/2023   Tinnitus  01/14/2023   Change in vision 01/14/2023   Encounter for screening colonoscopy 01/14/2023   Personal history of fall 01/14/2023   Problem related to unspecified psychosocial circumstances 12/28/2022   Person encountering health services to consult on behalf of another person 12/28/2022   Palpitation 12/10/2022   Fatigue 10/26/2022   B12 deficiency 10/26/2022   Right shoulder pain 09/15/2022   Depression 09/14/2022   Other constipation 05/12/2022   Medicare annual wellness visit, initial 07/17/2021   TMJ (dislocation of temporomandibular joint) 07/17/2021   Encounter for screening mammogram for breast cancer 06/05/2021   Diarrhea 04/11/2020   Epigastric pain 02/19/2020   Dark stools 02/19/2020   History of atrial fibrillation 02/05/2019   Shortness of breath 01/27/2019   Irregular surface of cornea 12/25/2018   Acute cough 09/08/2017   History of HPV infection 12/01/2016   Screening mammogram, encounter for 11/29/2016   Estrogen deficiency 11/29/2016   Encounter for routine gynecological examination 11/29/2016   Grade III hemorrhoids 10/06/2016   Tachycardia 08/14/2016   Hemorrhoids 08/13/2016   Atypical migraine 08/03/2016   CAD (coronary artery disease)    AAA (abdominal aortic aneurysm) (HCC)    Chronic combined systolic (congestive) and diastolic (congestive) heart failure (HCC)    Prediabetes 02/11/2016   Generalized anxiety disorder 12/08/2015   Panic attacks 07/28/2015   Chest pain 07/09/2013   Sensorineural hearing loss 03/27/2013   Vitamin D deficiency 07/03/2012   H/O aortic dissection 05/12/2012   Routine general medical examination at a health care facility 09/20/2011   Atypical chest pain 09/15/2011   S/P AVR (aortic valve replacement) 01/07/2011   CORONARY ARTERY BYPASS GRAFT, HX OF 01/07/2011   Arthritis 12/20/2010   Back pain 12/15/2010   H/O dissecting abdominal aortic aneurysm repair 08/24/2010   IRRITABLE BOWEL SYNDROME 09/09/2009   Obstructive sleep  apnea 08/04/2009   External hemorrhoid 06/26/2009   Osteoporosis 01/09/2009   Hyperlipidemia 07/26/2007   Depression with anxiety 07/26/2007   Essential hypertension 07/26/2007   Allergic rhinitis 07/26/2007   Asthma 07/26/2007   GERD 07/26/2007   Osteoarthritis 07/26/2007   Ocular migraine 07/26/2007   Mild intermittent asthma without complication 07/26/2007    PCP: [name] MD   REFERRING PROVIDER: Stormy Card  REFERRING DIAG: L rotator cuff   THERAPY DIAG:  Stiffness of left shoulder, not elsewhere classified  Other abnormalities of gait and mobility  Abnormal posture  Other low back pain  Left shoulder pain, unspecified chronicity  Rationale for Evaluation and Treatment: Rehabilitation  ONSET DATE: Feb 2024  SUBJECTIVE:  SUBJECTIVE STATEMENT: Patient returning back to PT, has been compliant with HEP. Reports back pain 4/10, R shoulder 2/10, L hip 3/10   Hand dominance: Left  PERTINENT HISTORY: Lalisa Kiehn is a 66yoF familiar to our services. Pt referred to PT for strengthening of chronic left shoulder DJD, known remote RC injury. Recently fell and broke four metatarsals, since cleared from use of CAM rocker. She was treated successfully in past for right shoulder pain and has past medical history of Right shoulder surgery replacement. 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 PMH aorta type A dissection in sept 2011 s/p aortic valve replacement, bypass of R coronary artery, anemia, anxiety, CAD, CHF, depression, GERD, Heart failure, HLD, HTN, IBS, migraine, osteoarthritis, osteoporosis, RA, back pain.    Back pain:  Patient has new referral for radiculopathy, lumbar region. MRI showed degeneration of L1-2 and L2-3 with some disc space collapse and minor retrolisthesis of  L2-3. Issue began in February, felt numbness in L thigh. No cause known, but she was at a museum and noticed it was numb. Sometimes L foot numbness and toe numbness of 2nd and 3rd toe causing tripping. This caused her to trip July 4th and break five metatarsals. Some minor pain in the low pain noted. Pain noted to be 2-3/10 Pain in hip 3-4/10 of L hip.    PAIN:  Are you having pain? Yes, 3/10 pain in lateral deltoids in the shoulder.  PRECAUTIONS: None  FALLS:  Has patient fallen in last 6 months? Yes. Number of falls 2  PATIENT GOALS:to lift more than 3lb with LUE, be able to eat a meal without arm being tired.    OBJECTIVE:  DIAGNOSTIC FINDINGS:  IMPRESSION: Mild degenerative change in the cervical spine without acute fracture or traumatic subluxation.   IMPRESSION: Full width, full width tears of the supraspinatus and infraspinatus tendons with retraction to the glenoid. Grade 2/3 infraspinatus atrophy.   Moderate tendinosis of the distal subscapularis tendon with articular sided fraying of the cephalad fibers.   Moderate intra-articular long head biceps tendinosis with fraying and possible partial tearing.   Mild glenohumeral and moderate AC joint osteoarthritis.   Degenerative superior labral tearing anteriorly and posteriorly.   PATIENT SURVEYS:  Quick Dash 50%  and FOTO 46%   COGNITION: Overall cognitive status: Within functional limits for tasks assessed                                  SENSATION: WFL   POSTURE: Slight rounded shoulders and shoulder elevation    UPPER EXTREMITY ROM:    Active ROM Right eval 9/16: recert RUE Left eval 9/16 RECERT LUE  Shoulder flexion 64 122 91 92  Shoulder extension        Shoulder abduction 95 88 52 82  Shoulder adduction        Shoulder internal rotation Unable to test at 90 61 degrees 5 40  Shoulder external rotation 18at side of body -5 degrees at 90 degrees 34 35  Elbow flexion [facility]  WFL   Elbow extension [facility]   WFL   Wrist flexion        Wrist extension        Wrist ulnar deviation        Wrist radial deviation        Wrist pronation        Wrist supination        (Blank rows =  not tested)   UPPER EXTREMITY MMT:   MMT Right eval Left eval  Shoulder flexion      Shoulder extension      Shoulder abduction      Shoulder adduction      Shoulder internal rotation      Shoulder external rotation      Middle trapezius      Lower trapezius      Elbow flexion      Elbow extension      Wrist flexion      Wrist extension      Wrist ulnar deviation      Wrist radial deviation      Wrist pronation      Wrist supination      Grip strength (lbs) 30 35  (Blank rows = not tested) Unable to test strength due to pain with motion   TRUNK ROM: Trunk Flexion 65  Trunk Extension 7  Trunk R SB Limited 25%  Trunk L SB WFL  Trunk R rotation WFL  Trunk L rotation Limited 25%    Ankle ROM Seated: Motion: AROM seated Right Left  Plantarflexion 4+ 3  Dorsiflexion 4+ 4  Inversion 4 3+  Eversion 4+ 3+    SHOULDER SPECIAL TESTS: Impingement tests:  unable to test due to pain in starting position SLAP lesions:  unable to test due to pain in starting position Instability tests:  unable to test due to pain in starting position Rotator cuff assessment:  unable to test due to pain in starting position Biceps assessment:  unable to test due to pain in starting position   LOW BACK SPECIAL TEST: SLR: negative  Scour test: negative FABER: negative Pelvic compression/distraction: negative  Slump test positive LLE  6 min walk test: 1160 ft increased left foot drag and antalgic gait pattern with foot eversion    JOINT MOBILITY TESTING:  AP: painful PA: painful Inferior: painful Distraction: reduces pain   Back mobilization hypomobile and painful to grade II mobilizations    PALPATION:  Tenderness to all joint regions of GH     TODAY'S TREATMENT: DATE: 02/16/24    Manual:  Grade II  mobilizations thoracic and lumbar spine x 8 minutes Distraction with band:  -lateral glide 3x30 seconds -figure four stretch inferior glide 30 seconds x 3 trials Prone hip flexor lengthening 30 seconds each LE   TherAct Supine: -posterior pelvic tilt 10x -posterior pelvic tilt with march 12x each LE -cross body isometric press on knee 10x  -thoracic rotation 10x  -nerve glide 25x each LE -GTB abduction 15x -adduction ball squeeze 5 second holds   Seated:  -GTB hamstring curl 15x -GTB PF press 15   PATIENT EDUCATION: Education details: goals, POC  Person educated: Patient Education method: Explanation, Demonstration, Tactile cues, Verbal cues, and Handouts Education comprehension: verbalized understanding, returned demonstration, verbal cues required, and tactile cues required  HOME EXERCISE PROGRAM:  Access Code: [email or web address] URL: [email or web address] Date: 10/03/2023 Prepared by: Precious Bard  Exercises - Seated Scapular Retraction  - 1 x daily - 7 x weekly - 2 sets - 10 reps - 5 hold - Neck Sidebending  - 1 x daily - 7 x weekly - 2 sets - 2 reps - 30 hold - Supine Isometric Shoulder Extension with Towel  - 1 x daily - 7 x weekly - 2 sets - 10 reps - 5 hold - Supine Isometric Shoulder Adduction with Towel Roll  - 1 x daily - 7 x weekly -  2 sets - 10 reps - 5 hold - Seated Bilateral Shoulder Flexion Towel Slide at Table Top  - 1 x daily - 7 x weekly - 1 sets - 3 reps - 30 hold - Supine Posterior Pelvic Tilt  - 1 x daily - 7 x weekly - 2 sets - 5 reps - 30 hold - Supine LE Neural Mobilization  - 1 x daily - 7 x weekly - 2 sets - 10 reps - 5 hold - Seated Ankle Alphabet  - 1 x daily - 7 x weekly - 2 sets - 1 reps - 5 hold  ASSESSMENT:  CLINICAL IMPRESSION:  Goals performed previous session on 01/26/24, please refer to this note for further details. Patient's condition has the potential to improve in response to therapy. Maximum improvement is yet to be obtained.  The anticipated improvement is attainable and reasonable in a generally predictable time.  Patient tolerates core and LE strengthening with occasional spasm of L hip/leg pain.  Pt will continue to benefit from skilled therapy to address remaining deficits in order to improve overall QoL and return to PLOF.      OBJECTIVE IMPAIRMENTS: cardiopulmonary status limiting activity, decreased activity tolerance, decreased mobility, increased fascial restrictions, impaired perceived functional ability, increased muscle spasms, impaired flexibility, impaired UE functional use, improper body mechanics, postural dysfunction, and pain.   ACTIVITY LIMITATIONS: carrying, lifting, bed mobility, bathing, dressing, self feeding, reach over head, and caring for others  PARTICIPATION LIMITATIONS: meal prep, cleaning, laundry, personal finances, interpersonal relationship, driving, shopping, community activity, yard work, church, and caregiving for husband  PERSONAL FACTORS: Age, Fitness, Past/current experiences, Time since onset of injury/illness/exacerbation, and 3+ comorbidities: aorta type A dissection in sept 2011 with aortic valve replacement bypass of R coronary artery, AAA, anemia, anxiety, CAD, CHF, depression, GERD, Heart failure, HLD, HTN, IBS, Kidney problems, migraine, osteoarthritis, osteoporosis, RA, ocular migraines, back pain  are also affecting patient's functional outcome.   REHAB POTENTIAL: Fair    CLINICAL DECISION MAKING: Evolving/moderate complexity  EVALUATION COMPLEXITY: Moderate   GOALS: Goals reviewed with patient? Yes  SHORT TERM GOALS: Target date: 12/27/2023  Patient will be independent in home exercise program to improve strength/mobility for better functional independence with ADLs. Baseline:7/23: HEP given 9/16: compliant 1x/week  Goal status: On going     LONG TERM GOALS: Target date: 03/22/2024   Patient will increase FOTO score to equal to or greater than  57%   to  demonstrate statistically significant improvement in mobility and quality of life.  Baseline: 7/22: 46 9/16: 50% 10/7: 40% 12/10: 57% Goal status: MET  2.  Patient will improve shoulder AROM to > 140 degrees of flexion, scaption, and abduction for improved ability to perform overhead activities. Baseline: 7/22: see above 9/16: see above 10/7 : defer to exacerbation 12/10: shoulder abduction >140, flexion < 140 , assess scaption next visit 2/6: flexion >140, abduction >140 scaption >140 Goal status: MET  3.  Patient will demonstrate adequate shoulder ROM and strength to be able to shave and dress independently with pain less than 3/10. Baseline: 7/23: 6/10 pain with dressing, limited ROM, unable to test strength 9/16: 3/10 pain with dressing  Goal status: MET  4.  Patient will decrease Quick DASH score by > 8 points demonstrating reduced self-reported upper extremity disability for L and R shoulder  Baseline: 7/23: L shoulder 50% 9/16: R=54.54 % L 66% L 81% R 24% 12/10: R: 43.2% L: 56.8% 2/6: L 22%  R 34%  Goal  status: MET  5.  Patient will be able to eat an entire meal without resting arm demonstrating improved functional strength Baseline: 7/23: has to rest while eating due to pain. 9/16: able to make halfway through meal 10/7: can make it about halfway through meal, some meals can get 3/4s through meal 12/10: improved, still has to rest every once in a while 2/6: can get halfway to 2/3rds of the meal.  Goal status: Partially Met  6.  Patient will be able to stand 15 minutes without her LLE going numb allowing for ADLS such as showering and iADLs such as cooking Baseline: 5 minutes 12/10: will assess next visit 2/6: 10 minutes  Goal status: IN PROGRESS  7. Patient will increase six minute walk test distance to >1500 for progression to community ambulator and improve gait ability Baseline: 1160 ft  12/10: 1010 ft 2/6: 1140 ft  Goal status: IN PROGRESS  8  Patient will reduce modified  Oswestry score to <20 as to demonstrate minimal disability with ADLs including improved sleeping tolerance, walking/sitting tolerance etc for better mobility with ADLs.  Baseline: 2/6: 60% Goal status: New PLAN:  PT FREQUENCY: 2x/week  PT DURATION: 8 weeks  PLANNED INTERVENTIONS: Therapeutic exercises, Therapeutic activity, Neuromuscular re-education, Balance training, Gait training, Patient/Family education, Self Care, Joint mobilization, Vestibular training, Canalith repositioning, Visual/preceptual remediation/compensation, Dry Needling, Electrical stimulation, Spinal mobilization, Cryotherapy, Moist heat, Compression bandaging, scar mobilization, Splintting, Taping, Traction, Ultrasound, Biofeedback, Ionotophoresis 4mg /ml Dexamethasone, Manual therapy, and Re-evaluation  PLAN FOR NEXT SESSION:   Continue with LUE strengthening  L ankle strengthening, distraction, core activation, reassess scaption and flexion ROM, make HEP     Precious Bard PT  Physical Therapist - [facility]  [facility]  02/16/24, 2:45 PM

## 2024-02-16 ENCOUNTER — Ambulatory Visit: Payer: Medicare HMO

## 2024-02-16 DIAGNOSIS — R293 Abnormal posture: Secondary | ICD-10-CM

## 2024-02-16 DIAGNOSIS — M5459 Other low back pain: Secondary | ICD-10-CM

## 2024-02-16 DIAGNOSIS — M25612 Stiffness of left shoulder, not elsewhere classified: Secondary | ICD-10-CM

## 2024-02-16 DIAGNOSIS — M25512 Pain in left shoulder: Secondary | ICD-10-CM

## 2024-02-16 DIAGNOSIS — R2689 Other abnormalities of gait and mobility: Secondary | ICD-10-CM

## 2024-02-17 ENCOUNTER — Telehealth: Payer: Self-pay

## 2024-02-17 ENCOUNTER — Telehealth: Payer: Self-pay | Admitting: Cardiovascular Disease

## 2024-02-17 NOTE — Telephone Encounter (Signed)
 Copied from CRM [id number]. Topic: Clinical - Medical Advice >> Feb 17, 2024 10:15 AM Deaijah H wrote: Reason for CRM: Patient called in stating she still is not better and still has the cough ; think medication is causing the cough. Stopped taking and stated she's not coughing as much. Please call [phone]

## 2024-02-17 NOTE — Telephone Encounter (Signed)
 Pt c/o medication issue:  1. Name of Medication: carvedilol (COREG) 12.5 MG tablet   2. How are you currently taking this medication (dosage and times per day)? As written  3. Are you having a reaction (difficulty breathing--STAT)? cough  4. What is your medication issue? Pt states she has had a coffee for the last 4 months and thinks it is caused by taking the higher dose of this medication. Please advise

## 2024-02-17 NOTE — Telephone Encounter (Signed)
 I assume she means the lisinopril /ace  Would she like to try ARB in it's place (like losartan) ?     Similar indications but should not cause the cough  Glad cough is improved

## 2024-02-17 NOTE — Telephone Encounter (Signed)
 I recommend checking in with cardiology to see if they want or can change to different beta blocker  That is not a common side effect but it can happen   If she does not improve further let me know - would consider further eval in that case

## 2024-02-17 NOTE — Telephone Encounter (Signed)
 Called patient and notified her of the following from Dr. [name].  I suspect the cough may be from lung irritation post virus She is welcome to try the lower dose carvedilol Would watch heart rate and blood pressure on the reduced dose Thx TGollan    Patient verbalizes understanding.

## 2024-02-17 NOTE — Telephone Encounter (Signed)
 Pt said she just spoke to cardiology and they told her she can cut back down to once daily on Coreg. Pt will try that and see if it helps and if not she will let us know so we can proceed with eval

## 2024-02-17 NOTE — Telephone Encounter (Signed)
 Pt said she doesn't think it's the lisinopril she said she is only taking a small dose of that med and she was fine. Pt said it was when her Coreg was increased to BID that she started having problems pt was talking about the Coreg not lisinopril. Pt also said she isn't sure if she needs to change that med or proceed with a specialist referral like pulmonary or ENT pt said whatever Dr. Milinda Antis thinks will help she is willing to do

## 2024-02-17 NOTE — Telephone Encounter (Signed)
 Called patient, she states she has had a cough for the last 4 months. She has been on Carvedilol for many years, it was recently increased March/April of last year. She did states she has had this cough since covid, she believes she has long covid. She recently had a telehealth visit with PCP 02/19- they did chest xray, showed no pneumonia- did advise if no improvement to call them back- I did advise with patient she should notify them that the cough did not improve.   She would still like to discuss with Dr.Gollan if okay to go back to her lower dosage of Carvedilol. She states her BP/HR remain at goal. However patient states her HR stays around 90 and below. She did not give BP readings. Advised I would route to MD/RN to advise further.

## 2024-02-20 DIAGNOSIS — H5213 Myopia, bilateral: Secondary | ICD-10-CM | POA: Diagnosis not present

## 2024-02-20 DIAGNOSIS — H524 Presbyopia: Secondary | ICD-10-CM | POA: Diagnosis not present

## 2024-02-20 DIAGNOSIS — H16403 Unspecified corneal neovascularization, bilateral: Secondary | ICD-10-CM | POA: Diagnosis not present

## 2024-02-20 DIAGNOSIS — H25813 Combined forms of age-related cataract, bilateral: Secondary | ICD-10-CM | POA: Diagnosis not present

## 2024-02-20 DIAGNOSIS — H52203 Unspecified astigmatism, bilateral: Secondary | ICD-10-CM | POA: Diagnosis not present

## 2024-02-20 NOTE — Therapy (Signed)
 OUTPATIENT PHYSICAL THERAPY TREATMENT      Patient Name: Rachel Torres MRN: [id number] DOB:04-04-1956, 68 y.o., female Today's Date: 02/21/2024  END OF SESSION:  PT End of Session - 02/21/24 1307     Visit Number 21    Number of Visits 35    Date for PT Re-Evaluation 03/22/24    Authorization Type [employer]  Medicare HMO    Authorization Time Period 09/05/23-10/31/23    Progress Note Due on Visit 10    PT Start Time 1315    PT Stop Time 1359    PT Time Calculation (min) 44 min    Equipment Utilized During Treatment Gait belt    Activity Tolerance Patient tolerated treatment well    Behavior During Therapy WFL for tasks assessed/performed                            Past Medical History:  Diagnosis Date   AAA (abdominal aortic aneurysm) (HCC)    a. Chronic w/o evidence of aneurysmal dil on CTA 01/2016.   Alcohol abuse    Allergy    Anemia    Anxiety    Asthma    CAD (coronary artery disease)    a. 08/2010 s/p CABG x 1 (VG->RCA) @ time of Ao dissection repair; b. 01/2016 Lexiscan MV: mid antsept/apical defect w/ ? peri-infarct ischemia-->likely attenuation-->Med Rx.   Chewing difficulty    Chronic combined systolic (congestive) and diastolic (congestive) heart failure (HCC)    a. 2012 EF 30-35%; b. 04/2015 EF 40-45%; c. 01/2016 Echo: Ef 55-65%, nl AoV bioprosthesis, nl RV, nl PASP.   Constipation    Depression    Edema of both lower extremities    Gallbladder sludge    GERD (gastroesophageal reflux disease)    Heart failure (HCC)    Heart valve problem    History of Bicuspid Aortic Valve    a. 08/2010 s/p AVR @ time of Ao dissection repair; b. 01/2016 Echo: Ef 55-65%, nl AoV bioprosthesis, nl RV, nl PASP.   Hx of repair of dissecting thoracic aortic aneurysm, Stanford type A    a. 08/2010 s/p repair with AVR and VG->RCA; b. 01/2016 CTA: stable appearance of Asc Thoracic Aortic graft. Opacification of flase lumen of chronic abd Ao dissection w/ retrograde flow  through lumbar arteries. No aneurysmal dil of flase lumen.   Hyperlipidemia    Hypertension    Hypertensive heart disease    IBS (irritable bowel syndrome)    Internal hemorrhoids    Kidney problem    Marfan syndrome    pt denies   Migraine    Obstructive sleep apnea    Osteoarthritis    Osteoporosis    Palpitations    Pneumonia    RA (rheumatoid arthritis) (HCC)    a. Followed by Dr. Mallie Mussel   Past Surgical History:  Procedure Laterality Date   ABDOMINAL AORTIC ANEURYSM REPAIR     CARDIAC CATHETERIZATION  2001   CESAREAN SECTION  1986 & 1991   CHOLECYSTECTOMY     COLONOSCOPY     COLONOSCOPY WITH PROPOFOL N/A 01/19/2024   Procedure: COLONOSCOPY WITH PROPOFOL;  Surgeon: Wyline Mood, MD;  Location: [facility] ENDOSCOPY;  Service: Gastroenterology;  Laterality: N/A;   ESOPHAGOGASTRODUODENOSCOPY (EGD) WITH PROPOFOL N/A 05/08/2020   Procedure: ESOPHAGOGASTRODUODENOSCOPY (EGD) WITH PROPOFOL;  Surgeon: Pasty Spillers, MD;  Location: [facility] ENDOSCOPY;  Service: Endoscopy;  Laterality: N/A;   FRACTURE SURGERY Right    shoulder replacement  HEMORRHOID BANDING     ORIF DISTAL RADIUS FRACTURE Left    POLYPECTOMY  01/19/2024   Procedure: POLYPECTOMY;  Surgeon: Wyline Mood, MD;  Location: [facility] ENDOSCOPY;  Service: Gastroenterology;;   UPPER GASTROINTESTINAL ENDOSCOPY     Patient Active Problem List   Diagnosis Date Noted   Adenomatous polyp of colon 01/19/2024   Heart failure with improved ejection fraction (HFimpEF) (HCC) 01/18/2024   Abnormal metabolism 11/14/2023   Weight regain after inital weight loss 11/14/2023   Multiple closed tarsal fractures of left foot, sequela 07/12/2023   Arm contusion, right, sequela 07/12/2023   Lumbar disc disease 07/12/2023   PAF (paroxysmal atrial fibrillation) (HCC) 06/09/2023   Paresthesia 06/03/2023   Obesity, Beginning BMI 35.45 02/17/2023   Tinnitus 01/14/2023   Change in vision 01/14/2023   Encounter for screening colonoscopy 01/14/2023    Personal history of fall 01/14/2023   Problem related to unspecified psychosocial circumstances 12/28/2022   Person encountering health services to consult on behalf of another person 12/28/2022   Palpitation 12/10/2022   Fatigue 10/26/2022   B12 deficiency 10/26/2022   Right shoulder pain 09/15/2022   Depression 09/14/2022   Other constipation 05/12/2022   Medicare annual wellness visit, initial 07/17/2021   TMJ (dislocation of temporomandibular joint) 07/17/2021   Encounter for screening mammogram for breast cancer 06/05/2021   Diarrhea 04/11/2020   Epigastric pain 02/19/2020   Dark stools 02/19/2020   History of atrial fibrillation 02/05/2019   Shortness of breath 01/27/2019   Irregular surface of cornea 12/25/2018   Acute cough 09/08/2017   History of HPV infection 12/01/2016   Screening mammogram, encounter for 11/29/2016   Estrogen deficiency 11/29/2016   Encounter for routine gynecological examination 11/29/2016   Grade III hemorrhoids 10/06/2016   Tachycardia 08/14/2016   Hemorrhoids 08/13/2016   Atypical migraine 08/03/2016   CAD (coronary artery disease)    AAA (abdominal aortic aneurysm) (HCC)    Chronic combined systolic (congestive) and diastolic (congestive) heart failure (HCC)    Prediabetes 02/11/2016   Generalized anxiety disorder 12/08/2015   Panic attacks 07/28/2015   Chest pain 07/09/2013   Sensorineural hearing loss 03/27/2013   Vitamin D deficiency 07/03/2012   H/O aortic dissection 05/12/2012   Routine general medical examination at a health care facility 09/20/2011   Atypical chest pain 09/15/2011   S/P AVR (aortic valve replacement) 01/07/2011   CORONARY ARTERY BYPASS GRAFT, HX OF 01/07/2011   Arthritis 12/20/2010   Back pain 12/15/2010   H/O dissecting abdominal aortic aneurysm repair 08/24/2010   IRRITABLE BOWEL SYNDROME 09/09/2009   Obstructive sleep apnea 08/04/2009   External hemorrhoid 06/26/2009   Osteoporosis 01/09/2009   Hyperlipidemia  07/26/2007   Depression with anxiety 07/26/2007   Essential hypertension 07/26/2007   Allergic rhinitis 07/26/2007   Asthma 07/26/2007   GERD 07/26/2007   Osteoarthritis 07/26/2007   Ocular migraine 07/26/2007   Mild intermittent asthma without complication 07/26/2007    PCP: [name] MD   REFERRING PROVIDER: Stormy Card  REFERRING DIAG: L rotator cuff   THERAPY DIAG:  Stiffness of left shoulder, not elsewhere classified  Other abnormalities of gait and mobility  Abnormal posture  Other low back pain  Rationale for Evaluation and Treatment: Rehabilitation  ONSET DATE: Feb 2024  SUBJECTIVE:  SUBJECTIVE STATEMENT: Patient fell last night, tripped over a cat.   Hand dominance: Left  PERTINENT HISTORY: Brelee Renk is a 66yoF familiar to our services. Pt referred to PT for strengthening of chronic left shoulder DJD, known remote RC injury. Recently fell and broke four metatarsals, since cleared from use of CAM rocker. She was treated successfully in past for right shoulder pain and has past medical history of Right shoulder surgery replacement. 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 PMH aorta type A dissection in sept 2011 s/p aortic valve replacement, bypass of R coronary artery, anemia, anxiety, CAD, CHF, depression, GERD, Heart failure, HLD, HTN, IBS, migraine, osteoarthritis, osteoporosis, RA, back pain.    Back pain:  Patient has new referral for radiculopathy, lumbar region. MRI showed degeneration of L1-2 and L2-3 with some disc space collapse and minor retrolisthesis of L2-3. Issue began in February, felt numbness in L thigh. No cause known, but she was at a museum and noticed it was numb. Sometimes L foot numbness and toe numbness of 2nd and 3rd toe causing tripping. This  caused her to trip July 4th and break five metatarsals. Some minor pain in the low pain noted. Pain noted to be 2-3/10 Pain in hip 3-4/10 of L hip.    PAIN:  Are you having pain? Yes, 3/10 pain in lateral deltoids in the shoulder.  PRECAUTIONS: None  FALLS:  Has patient fallen in last 6 months? Yes. Number of falls 2  PATIENT GOALS:to lift more than 3lb with LUE, be able to eat a meal without arm being tired.    OBJECTIVE:  DIAGNOSTIC FINDINGS:  IMPRESSION: Mild degenerative change in the cervical spine without acute fracture or traumatic subluxation.   IMPRESSION: Full width, full width tears of the supraspinatus and infraspinatus tendons with retraction to the glenoid. Grade 2/3 infraspinatus atrophy.   Moderate tendinosis of the distal subscapularis tendon with articular sided fraying of the cephalad fibers.   Moderate intra-articular long head biceps tendinosis with fraying and possible partial tearing.   Mild glenohumeral and moderate AC joint osteoarthritis.   Degenerative superior labral tearing anteriorly and posteriorly.   PATIENT SURVEYS:  Quick Dash 50%  and FOTO 46%   COGNITION: Overall cognitive status: Within functional limits for tasks assessed                                  SENSATION: WFL   POSTURE: Slight rounded shoulders and shoulder elevation    UPPER EXTREMITY ROM:    Active ROM Right eval 9/16: recert RUE Left eval 9/16 RECERT LUE  Shoulder flexion 64 122 91 92  Shoulder extension        Shoulder abduction 95 88 52 82  Shoulder adduction        Shoulder internal rotation Unable to test at 90 61 degrees 5 40  Shoulder external rotation 18at side of body -5 degrees at 90 degrees 34 35  Elbow flexion [facility]  WFL   Elbow extension [facility]  WFL   Wrist flexion        Wrist extension        Wrist ulnar deviation        Wrist radial deviation        Wrist pronation        Wrist supination        (Blank rows = not tested)   UPPER EXTREMITY  MMT:   MMT Right  eval Left eval  Shoulder flexion      Shoulder extension      Shoulder abduction      Shoulder adduction      Shoulder internal rotation      Shoulder external rotation      Middle trapezius      Lower trapezius      Elbow flexion      Elbow extension      Wrist flexion      Wrist extension      Wrist ulnar deviation      Wrist radial deviation      Wrist pronation      Wrist supination      Grip strength (lbs) 30 35  (Blank rows = not tested) Unable to test strength due to pain with motion   TRUNK ROM: Trunk Flexion 65  Trunk Extension 7  Trunk R SB Limited 25%  Trunk L SB WFL  Trunk R rotation WFL  Trunk L rotation Limited 25%    Ankle ROM Seated: Motion: AROM seated Right Left  Plantarflexion 4+ 3  Dorsiflexion 4+ 4  Inversion 4 3+  Eversion 4+ 3+    SHOULDER SPECIAL TESTS: Impingement tests:  unable to test due to pain in starting position SLAP lesions:  unable to test due to pain in starting position Instability tests:  unable to test due to pain in starting position Rotator cuff assessment:  unable to test due to pain in starting position Biceps assessment:  unable to test due to pain in starting position   LOW BACK SPECIAL TEST: SLR: negative  Scour test: negative FABER: negative Pelvic compression/distraction: negative  Slump test positive LLE  6 min walk test: 1160 ft increased left foot drag and antalgic gait pattern with foot eversion    JOINT MOBILITY TESTING:  AP: painful PA: painful Inferior: painful Distraction: reduces pain   Back mobilization hypomobile and painful to grade II mobilizations    PALPATION:  Tenderness to all joint regions of GH     TODAY'S TREATMENT: DATE: 02/21/24    Manual:  Grade II mobilizations thoracic and lumbar spine x 8 minutes Distraction with band:  -lateral glide 3x30 seconds -figure four stretch inferior glide 30 seconds x 3 trials Prone hip flexor lengthening 30 seconds each  LE   TherAct Supine: -posterior pelvic tilt 10x -posterior pelvic tilt with march 12x each LE -cross body isometric press on knee 10x  -thoracic rotation 10x  -nerve glide 25x each LE -GTB abduction 15x -adduction ball squeeze 5 second holds   Child pose 30 seconds x 2 trials   Seated:  -GTB hamstring curl 15x -GTB PF press 15   PATIENT EDUCATION: Education details: goals, POC  Person educated: Patient Education method: Explanation, Demonstration, Tactile cues, Verbal cues, and Handouts Education comprehension: verbalized understanding, returned demonstration, verbal cues required, and tactile cues required  HOME EXERCISE PROGRAM:  Access Code: [email or web address] URL: [email or web address] Date: 10/03/2023 Prepared by: Precious Bard  Exercises - Seated Scapular Retraction  - 1 x daily - 7 x weekly - 2 sets - 10 reps - 5 hold - Neck Sidebending  - 1 x daily - 7 x weekly - 2 sets - 2 reps - 30 hold - Supine Isometric Shoulder Extension with Towel  - 1 x daily - 7 x weekly - 2 sets - 10 reps - 5 hold - Supine Isometric Shoulder Adduction with Towel Roll  - 1 x daily - 7 x weekly - 2 sets -  10 reps - 5 hold - Seated Bilateral Shoulder Flexion Towel Slide at Table Top  - 1 x daily - 7 x weekly - 1 sets - 3 reps - 30 hold - Supine Posterior Pelvic Tilt  - 1 x daily - 7 x weekly - 2 sets - 5 reps - 30 hold - Supine LE Neural Mobilization  - 1 x daily - 7 x weekly - 2 sets - 10 reps - 5 hold - Seated Ankle Alphabet  - 1 x daily - 7 x weekly - 2 sets - 1 reps - 5 hold  ASSESSMENT:  CLINICAL IMPRESSION:  Patient has increased torsion of lumbar spine that is reduced by end of session s/p fall. She is highly motivated throughout session.  Core training continues to be an area of focus. Pt will continue to benefit from skilled therapy to address remaining deficits in order to improve overall QoL and return to PLOF.      OBJECTIVE IMPAIRMENTS: cardiopulmonary status limiting  activity, decreased activity tolerance, decreased mobility, increased fascial restrictions, impaired perceived functional ability, increased muscle spasms, impaired flexibility, impaired UE functional use, improper body mechanics, postural dysfunction, and pain.   ACTIVITY LIMITATIONS: carrying, lifting, bed mobility, bathing, dressing, self feeding, reach over head, and caring for others  PARTICIPATION LIMITATIONS: meal prep, cleaning, laundry, personal finances, interpersonal relationship, driving, shopping, community activity, yard work, church, and caregiving for husband  PERSONAL FACTORS: Age, Fitness, Past/current experiences, Time since onset of injury/illness/exacerbation, and 3+ comorbidities: aorta type A dissection in sept 2011 with aortic valve replacement bypass of R coronary artery, AAA, anemia, anxiety, CAD, CHF, depression, GERD, Heart failure, HLD, HTN, IBS, Kidney problems, migraine, osteoarthritis, osteoporosis, RA, ocular migraines, back pain  are also affecting patient's functional outcome.   REHAB POTENTIAL: Fair    CLINICAL DECISION MAKING: Evolving/moderate complexity  EVALUATION COMPLEXITY: Moderate   GOALS: Goals reviewed with patient? Yes  SHORT TERM GOALS: Target date: 12/27/2023  Patient will be independent in home exercise program to improve strength/mobility for better functional independence with ADLs. Baseline:7/23: HEP given 9/16: compliant 1x/week  Goal status: On going     LONG TERM GOALS: Target date: 03/22/2024   Patient will increase FOTO score to equal to or greater than  57%   to demonstrate statistically significant improvement in mobility and quality of life.  Baseline: 7/22: 46 9/16: 50% 10/7: 40% 12/10: 57% Goal status: MET  2.  Patient will improve shoulder AROM to > 140 degrees of flexion, scaption, and abduction for improved ability to perform overhead activities. Baseline: 7/22: see above 9/16: see above 10/7 : defer to exacerbation  12/10: shoulder abduction >140, flexion < 140 , assess scaption next visit 2/6: flexion >140, abduction >140 scaption >140 Goal status: MET  3.  Patient will demonstrate adequate shoulder ROM and strength to be able to shave and dress independently with pain less than 3/10. Baseline: 7/23: 6/10 pain with dressing, limited ROM, unable to test strength 9/16: 3/10 pain with dressing  Goal status: MET  4.  Patient will decrease Quick DASH score by > 8 points demonstrating reduced self-reported upper extremity disability for L and R shoulder  Baseline: 7/23: L shoulder 50% 9/16: R=54.54 % L 66% L 81% R 24% 12/10: R: 43.2% L: 56.8% 2/6: L 22%  R 34%  Goal status: MET  5.  Patient will be able to eat an entire meal without resting arm demonstrating improved functional strength Baseline: 7/23: has to rest while eating due to  pain. 9/16: able to make halfway through meal 10/7: can make it about halfway through meal, some meals can get 3/4s through meal 12/10: improved, still has to rest every once in a while 2/6: can get halfway to 2/3rds of the meal.  Goal status: Partially Met  6.  Patient will be able to stand 15 minutes without her LLE going numb allowing for ADLS such as showering and iADLs such as cooking Baseline: 5 minutes 12/10: will assess next visit 2/6: 10 minutes  Goal status: IN PROGRESS  7. Patient will increase six minute walk test distance to >1500 for progression to community ambulator and improve gait ability Baseline: 1160 ft  12/10: 1010 ft 2/6: 1140 ft  Goal status: IN PROGRESS  8  Patient will reduce modified Oswestry score to <20 as to demonstrate minimal disability with ADLs including improved sleeping tolerance, walking/sitting tolerance etc for better mobility with ADLs.  Baseline: 2/6: 60% Goal status: New PLAN:  PT FREQUENCY: 2x/week  PT DURATION: 8 weeks  PLANNED INTERVENTIONS: Therapeutic exercises, Therapeutic activity, Neuromuscular re-education, Balance  training, Gait training, Patient/Family education, Self Care, Joint mobilization, Vestibular training, Canalith repositioning, Visual/preceptual remediation/compensation, Dry Needling, Electrical stimulation, Spinal mobilization, Cryotherapy, Moist heat, Compression bandaging, scar mobilization, Splintting, Taping, Traction, Ultrasound, Biofeedback, Ionotophoresis 4mg /ml Dexamethasone, Manual therapy, and Re-evaluation  PLAN FOR NEXT SESSION:   Continue with LUE strengthening  L ankle strengthening, distraction, core activation, reassess scaption and flexion ROM, make HEP     Precious Bard PT  Physical Therapist - [facility]  [place]  02/21/24, 2:26 PM

## 2024-02-21 ENCOUNTER — Ambulatory Visit: Payer: Medicare HMO | Admitting: Psychology

## 2024-02-21 ENCOUNTER — Ambulatory Visit: Payer: Medicare HMO | Attending: Orthopedic Surgery

## 2024-02-21 DIAGNOSIS — M25511 Pain in right shoulder: Secondary | ICD-10-CM | POA: Insufficient documentation

## 2024-02-21 DIAGNOSIS — M6281 Muscle weakness (generalized): Secondary | ICD-10-CM | POA: Diagnosis not present

## 2024-02-21 DIAGNOSIS — F331 Major depressive disorder, recurrent, moderate: Secondary | ICD-10-CM | POA: Diagnosis not present

## 2024-02-21 DIAGNOSIS — R2689 Other abnormalities of gait and mobility: Secondary | ICD-10-CM | POA: Insufficient documentation

## 2024-02-21 DIAGNOSIS — R278 Other lack of coordination: Secondary | ICD-10-CM | POA: Diagnosis not present

## 2024-02-21 DIAGNOSIS — M5459 Other low back pain: Secondary | ICD-10-CM | POA: Insufficient documentation

## 2024-02-21 DIAGNOSIS — G8929 Other chronic pain: Secondary | ICD-10-CM | POA: Insufficient documentation

## 2024-02-21 DIAGNOSIS — M25612 Stiffness of left shoulder, not elsewhere classified: Secondary | ICD-10-CM | POA: Diagnosis not present

## 2024-02-21 DIAGNOSIS — R293 Abnormal posture: Secondary | ICD-10-CM | POA: Diagnosis not present

## 2024-02-21 DIAGNOSIS — M25512 Pain in left shoulder: Secondary | ICD-10-CM | POA: Insufficient documentation

## 2024-02-21 NOTE — Progress Notes (Signed)
 [facility] Counselor/Therapist Progress Note  Patient ID: SHELI DORIN, MRN: [id number],    Date: 02/21/2024  Time Spent: 10:00am-10:50am  50 minutes   Treatment Type: Individual Therapy  Reported Symptoms: stress  Mental Status Exam: Appearance:  Casual     Behavior: Appropriate  Motor: Normal  Speech/Language:  Normal Rate  Affect: Appropriate  Mood: normal  Thought process: normal  Thought content:   WNL  Sensory/Perceptual disturbances:   WNL  Orientation: oriented to person, place, time/date, and situation  Attention: Good  Concentration: Good  Memory: WNL  Fund of knowledge:  Good  Insight:   Good  Judgment:  Good  Impulse Control: Good   Risk Assessment: Danger to Self:  No Self-injurious Behavior: No Danger to Others: No Duty to Warn:no Physical Aggression / Violence:No  Access to Firearms a concern: No  Gang Involvement:No   Subjective: Pt present for face-to-face individual therapy via video.  Pt consents to telehealth video session and is aware of limitations and benefits of virtual sessions.  Location of pt: home Location of therapist: home office.  Pt talked about her home renovations.  The workers are making good progress and pt is excited about it.   Pt has planned a trip to [place] to see her son and grandson.   Pt plans to go March 22nd and 23rd.   Pt talked about her mother.   Pt is having to help her mother financially bc her mother is on a fixed income.    Pt talked about her health.  She fell last night and hurt her leg.  Pt has physical therapy today.  Pt talked about caregiving for [name].   [name] has been having some odd behaviors that have been challenging to deal with.  Worked with pt on problem solving and stress management.  Worked on self care strategies.  Provided supportive therapy.  Interventions: Cognitive Behavioral Therapy and Insight-Oriented  Diagnosis:  F33.1  Plan of Care: Recommend ongoing therapy.    Pt participated in setting treatment goals.   Pt needs a place to talk about stressors.  Plan to meet every two weeks.    Treatment Plan (target date: 03/23/2024) Client Abilities/Strengths  Pt is bright, engaging, and motivated for therapy.   Client Treatment Preferences  Individual therapy.  Client Statement of Needs  Improve coping skills.  Have a place to talk about stressors.   Symptoms  Depressed or irritable mood. Unresolved grief issues.  Problems Addressed  Unipolar Depression Goals 1. Alleviate depressive symptoms and return to previous level of effective functioning. 2. Appropriately grieve the loss in order to normalize mood and to return to previously adaptive level of functioning. Objective Learn and implement behavioral strategies to overcome depression. Target Date: 2024-03-23 Frequency: Biweekly  Progress: 40 Modality: individual  Related Interventions Engage the client in "behavioral activation," increasing his/her activity level and contact with sources of reward, while identifying processes that inhibit activation.  Use behavioral techniques such as instruction, rehearsal, role-playing, role reversal, as needed, to facilitate activity in the client's daily life; reinforce success. Assist the client in developing skills that increase the likelihood of deriving pleasure from behavioral activation (e.g., assertiveness skills, developing an exercise plan, less internal/more external focus, increased social involvement); reinforce success. Objective Identify important people in life, past and present, and describe the quality, good and poor, of those relationships. Target Date: 2024-03-23 Frequency: Biweekly  Progress: 40 Modality: individual  Related Interventions Conduct Interpersonal Therapy beginning with the  assessment of the client's "interpersonal inventory" of important past and present relationships; develop a case formulation linking depression to grief,  interpersonal role disputes, role transitions, and/or interpersonal deficits). Objective Learn and implement problem-solving and decision-making skills. Target Date: 2024-03-23 Frequency: Biweekly  Progress: 40 Modality: individual  Related Interventions Conduct Problem-Solving Therapy using techniques such as psychoeducation, modeling, and role-playing to teach client problem-solving skills (i.e., defining a problem specifically, generating possible solutions, evaluating the pros and cons of each solution, selecting and implementing a plan of action, evaluating the efficacy of the plan, accepting or revising the plan); role-play application of the problem-solving skill to a real life issue. Encourage in the client the development of a positive problem orientation in which problems and solving them are viewed as a natural part of life and not something to be feared, despaired, or avoided. 3. Develop healthy interpersonal relationships that lead to the alleviation and help prevent the relapse of depression. 4. Develop healthy thinking patterns and beliefs about self, others, and the world that lead to the alleviation and help prevent the relapse of depression. 5. Recognize, accept, and cope with feelings of depression. Diagnosis F33.1  Conditions For Discharge Achievement of treatment goals and objectives   Salomon Fick, LCSW

## 2024-02-22 NOTE — Therapy (Signed)
 OUTPATIENT PHYSICAL THERAPY TREATMENT      Patient Name: Rachel Torres MRN: [id number] DOB:[birth year], 68 y.o., female Today's Date: 02/23/2024  END OF SESSION:  PT End of Session - 02/23/24 1310     Visit Number 22    Number of Visits 35    Date for PT Re-Evaluation 03/22/24    Authorization Type [employer]  Medicare HMO    Authorization Time Period 09/05/23-10/31/23    Progress Note Due on Visit 10    PT Start Time 1314    PT Stop Time 1359    PT Time Calculation (min) 45 min    Equipment Utilized During Treatment Gait belt    Activity Tolerance Patient tolerated treatment well    Behavior During Therapy WFL for tasks assessed/performed                             Past Medical History:  Diagnosis Date   AAA (abdominal aortic aneurysm) (HCC)    a. Chronic w/o evidence of aneurysmal dil on CTA 01/2016.   Alcohol abuse    Allergy    Anemia    Anxiety    Asthma    CAD (coronary artery disease)    a. 08/2010 s/p CABG x 1 (VG->RCA) @ time of Ao dissection repair; b. 01/2016 Lexiscan MV: mid antsept/apical defect w/ ? peri-infarct ischemia-->likely attenuation-->Med Rx.   Chewing difficulty    Chronic combined systolic (congestive) and diastolic (congestive) heart failure (HCC)    a. 2012 EF 30-35%; b. 04/2015 EF 40-45%; c. 01/2016 Echo: Ef 55-65%, nl AoV bioprosthesis, nl RV, nl PASP.   Constipation    Depression    Edema of both lower extremities    Gallbladder sludge    GERD (gastroesophageal reflux disease)    Heart failure (HCC)    Heart valve problem    History of Bicuspid Aortic Valve    a. 08/2010 s/p AVR @ time of Ao dissection repair; b. 01/2016 Echo: Ef 55-65%, nl AoV bioprosthesis, nl RV, nl PASP.   Hx of repair of dissecting thoracic aortic aneurysm, Stanford type A    a. 08/2010 s/p repair with AVR and VG->RCA; b. 01/2016 CTA: stable appearance of Asc Thoracic Aortic graft. Opacification of flase lumen of chronic abd Ao dissection w/ retrograde  flow through lumbar arteries. No aneurysmal dil of flase lumen.   Hyperlipidemia    Hypertension    Hypertensive heart disease    IBS (irritable bowel syndrome)    Internal hemorrhoids    Kidney problem    Marfan syndrome    pt denies   Migraine    Obstructive sleep apnea    Osteoarthritis    Osteoporosis    Palpitations    Pneumonia    RA (rheumatoid arthritis) (HCC)    a. Followed by Dr. Mallie Mussel   Past Surgical History:  Procedure Laterality Date   ABDOMINAL AORTIC ANEURYSM REPAIR     CARDIAC CATHETERIZATION  2001   CESAREAN SECTION  1986 & 1991   CHOLECYSTECTOMY     COLONOSCOPY     COLONOSCOPY WITH PROPOFOL N/A 01/19/2024   Procedure: COLONOSCOPY WITH PROPOFOL;  Surgeon: Wyline Mood, MD;  Location: [facility] ENDOSCOPY;  Service: Gastroenterology;  Laterality: N/A;   ESOPHAGOGASTRODUODENOSCOPY (EGD) WITH PROPOFOL N/A 05/08/2020   Procedure: ESOPHAGOGASTRODUODENOSCOPY (EGD) WITH PROPOFOL;  Surgeon: Pasty Spillers, MD;  Location: [facility] ENDOSCOPY;  Service: Endoscopy;  Laterality: N/A;   FRACTURE SURGERY Right    shoulder  replacement   HEMORRHOID BANDING     ORIF DISTAL RADIUS FRACTURE Left    POLYPECTOMY  01/19/2024   Procedure: POLYPECTOMY;  Surgeon: Wyline Mood, MD;  Location: [facility] ENDOSCOPY;  Service: Gastroenterology;;   UPPER GASTROINTESTINAL ENDOSCOPY     Patient Active Problem List   Diagnosis Date Noted   Adenomatous polyp of colon 01/19/2024   Heart failure with improved ejection fraction (HFimpEF) (HCC) 01/18/2024   Abnormal metabolism 11/14/2023   Weight regain after inital weight loss 11/14/2023   Multiple closed tarsal fractures of left foot, sequela 07/12/2023   Arm contusion, right, sequela 07/12/2023   Lumbar disc disease 07/12/2023   PAF (paroxysmal atrial fibrillation) (HCC) 06/09/2023   Paresthesia 06/03/2023   Obesity, Beginning BMI 35.45 02/17/2023   Tinnitus 01/14/2023   Change in vision 01/14/2023   Encounter for screening colonoscopy 01/14/2023    Personal history of fall 01/14/2023   Problem related to unspecified psychosocial circumstances 12/28/2022   Person encountering health services to consult on behalf of another person 12/28/2022   Palpitation 12/10/2022   Fatigue 10/26/2022   B12 deficiency 10/26/2022   Right shoulder pain 09/15/2022   Depression 09/14/2022   Other constipation 05/12/2022   Medicare annual wellness visit, initial 07/17/2021   TMJ (dislocation of temporomandibular joint) 07/17/2021   Encounter for screening mammogram for breast cancer 06/05/2021   Diarrhea 04/11/2020   Epigastric pain 02/19/2020   Dark stools 02/19/2020   History of atrial fibrillation 02/05/2019   Shortness of breath 01/27/2019   Irregular surface of cornea 12/25/2018   Acute cough 09/08/2017   History of HPV infection 12/01/2016   Screening mammogram, encounter for 11/29/2016   Estrogen deficiency 11/29/2016   Encounter for routine gynecological examination 11/29/2016   Grade III hemorrhoids 10/06/2016   Tachycardia 08/14/2016   Hemorrhoids 08/13/2016   Atypical migraine 08/03/2016   CAD (coronary artery disease)    AAA (abdominal aortic aneurysm) (HCC)    Chronic combined systolic (congestive) and diastolic (congestive) heart failure (HCC)    Prediabetes 02/11/2016   Generalized anxiety disorder 12/08/2015   Panic attacks 07/28/2015   Chest pain 07/09/2013   Sensorineural hearing loss 03/27/2013   Vitamin D deficiency 07/03/2012   H/O aortic dissection 05/12/2012   Routine general medical examination at a health care facility 09/20/2011   Atypical chest pain 09/15/2011   S/P AVR (aortic valve replacement) 01/07/2011   CORONARY ARTERY BYPASS GRAFT, HX OF 01/07/2011   Arthritis 12/20/2010   Back pain 12/15/2010   H/O dissecting abdominal aortic aneurysm repair 08/24/2010   IRRITABLE BOWEL SYNDROME 09/09/2009   Obstructive sleep apnea 08/04/2009   External hemorrhoid 06/26/2009   Osteoporosis 01/09/2009    Hyperlipidemia 07/26/2007   Depression with anxiety 07/26/2007   Essential hypertension 07/26/2007   Allergic rhinitis 07/26/2007   Asthma 07/26/2007   GERD 07/26/2007   Osteoarthritis 07/26/2007   Ocular migraine 07/26/2007   Mild intermittent asthma without complication 07/26/2007    PCP: [name] MD   REFERRING PROVIDER: Stormy Card  REFERRING DIAG: L rotator cuff   THERAPY DIAG:  Stiffness of left shoulder, not elsewhere classified  Other abnormalities of gait and mobility  Abnormal posture  Other low back pain  Rationale for Evaluation and Treatment: Rehabilitation  ONSET DATE: Feb 2024  SUBJECTIVE:  SUBJECTIVE STATEMENT: Patient reports feeling better today. Still busy with home renovations.   Hand dominance: Left  PERTINENT HISTORY: Austynn Pridmore is a 66yoF familiar to our services. Pt referred to PT for strengthening of chronic left shoulder DJD, known remote RC injury. Recently fell and broke four metatarsals, since cleared from use of CAM rocker. She was treated successfully in past for right shoulder pain and has past medical history of Right shoulder surgery replacement. 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 PMH aorta type A dissection in sept 2011 s/p aortic valve replacement, bypass of R coronary artery, anemia, anxiety, CAD, CHF, depression, GERD, Heart failure, HLD, HTN, IBS, migraine, osteoarthritis, osteoporosis, RA, back pain.    Back pain:  Patient has new referral for radiculopathy, lumbar region. MRI showed degeneration of L1-2 and L2-3 with some disc space collapse and minor retrolisthesis of L2-3. Issue began in February, felt numbness in L thigh. No cause known, but she was at a museum and noticed it was numb. Sometimes L foot numbness and toe numbness of  2nd and 3rd toe causing tripping. This caused her to trip July 4th and break five metatarsals. Some minor pain in the low pain noted. Pain noted to be 2-3/10 Pain in hip 3-4/10 of L hip.    PAIN:  Are you having pain? Yes, 3/10 pain in lateral deltoids in the shoulder.  PRECAUTIONS: None  FALLS:  Has patient fallen in last 6 months? Yes. Number of falls 2  PATIENT GOALS:to lift more than 3lb with LUE, be able to eat a meal without arm being tired.    OBJECTIVE:  DIAGNOSTIC FINDINGS:  IMPRESSION: Mild degenerative change in the cervical spine without acute fracture or traumatic subluxation.   IMPRESSION: Full width, full width tears of the supraspinatus and infraspinatus tendons with retraction to the glenoid. Grade 2/3 infraspinatus atrophy.   Moderate tendinosis of the distal subscapularis tendon with articular sided fraying of the cephalad fibers.   Moderate intra-articular long head biceps tendinosis with fraying and possible partial tearing.   Mild glenohumeral and moderate AC joint osteoarthritis.   Degenerative superior labral tearing anteriorly and posteriorly.   PATIENT SURVEYS:  Quick Dash 50%  and FOTO 46%   COGNITION: Overall cognitive status: Within functional limits for tasks assessed                                  SENSATION: WFL   POSTURE: Slight rounded shoulders and shoulder elevation    UPPER EXTREMITY ROM:    Active ROM Right eval 9/16: recert RUE Left eval 9/16 RECERT LUE  Shoulder flexion 64 122 91 92  Shoulder extension        Shoulder abduction 95 88 52 82  Shoulder adduction        Shoulder internal rotation Unable to test at 90 61 degrees 5 40  Shoulder external rotation 18at side of body -5 degrees at 90 degrees 34 35  Elbow flexion [facility]  WFL   Elbow extension [facility]  WFL   Wrist flexion        Wrist extension        Wrist ulnar deviation        Wrist radial deviation        Wrist pronation        Wrist supination        (Blank  rows = not tested)   UPPER EXTREMITY MMT:  MMT Right eval Left eval  Shoulder flexion      Shoulder extension      Shoulder abduction      Shoulder adduction      Shoulder internal rotation      Shoulder external rotation      Middle trapezius      Lower trapezius      Elbow flexion      Elbow extension      Wrist flexion      Wrist extension      Wrist ulnar deviation      Wrist radial deviation      Wrist pronation      Wrist supination      Grip strength (lbs) 30 35  (Blank rows = not tested) Unable to test strength due to pain with motion   TRUNK ROM: Trunk Flexion 65  Trunk Extension 7  Trunk R SB Limited 25%  Trunk L SB WFL  Trunk R rotation WFL  Trunk L rotation Limited 25%    Ankle ROM Seated: Motion: AROM seated Right Left  Plantarflexion 4+ 3  Dorsiflexion 4+ 4  Inversion 4 3+  Eversion 4+ 3+    SHOULDER SPECIAL TESTS: Impingement tests:  unable to test due to pain in starting position SLAP lesions:  unable to test due to pain in starting position Instability tests:  unable to test due to pain in starting position Rotator cuff assessment:  unable to test due to pain in starting position Biceps assessment:  unable to test due to pain in starting position   LOW BACK SPECIAL TEST: SLR: negative  Scour test: negative FABER: negative Pelvic compression/distraction: negative  Slump test positive LLE  6 min walk test: 1160 ft increased left foot drag and antalgic gait pattern with foot eversion    JOINT MOBILITY TESTING:  AP: painful PA: painful Inferior: painful Distraction: reduces pain   Back mobilization hypomobile and painful to grade II mobilizations    PALPATION:  Tenderness to all joint regions of GH     TODAY'S TREATMENT: DATE: 02/23/24    Manual:  Grade II mobilizations thoracic and lumbar spine x 8 minutes Prone hip flexor lengthening 30 seconds each LE   TherAct Supine: -posterior pelvic tilt 10x -posterior pelvic tilt  with march 12x each LE -posterior pelvic tilt with adduction squeeze 12x 5 second holds -deadbug 10x each side; contralateral challenge  -cross body isometric press on knee 10x  -thoracic rotation 10x  -nerve glide 25x each LE -GTB abduction 15x  Child pose 30 seconds x 2 trials  Seated:  -GTB hamstring curl 15x -GTB PF press 15  adduction ball squeeze 5 second holds  -adduction squeeze with LAQ 10x  BAPS board LLE; -df/pf 10x -eversion/inversion 10x   Standing: Heel raise 15x   Trigger Point Dry Needling  Subsequent Treatment: Instructions provided previously at initial dry needling treatment.   Patient Verbal Consent Given: Yes Education Handout Provided: Previously Provided Muscles Treated: bilateral glutes and piriformis  Electrical Stimulation Performed: No Treatment Response/Outcome: tolerated well    PATIENT EDUCATION: Education details: goals, POC  Person educated: Patient Education method: Explanation, Demonstration, Tactile cues, Verbal cues, and Handouts Education comprehension: verbalized understanding, returned demonstration, verbal cues required, and tactile cues required  HOME EXERCISE PROGRAM:  Access Code: [email or web address] URL: [email or web address] Date: 10/03/2023 Prepared by: Precious Bard  Exercises - Seated Scapular Retraction  - 1 x daily - 7 x weekly - 2 sets - 10 reps - 5 hold -  Neck Sidebending  - 1 x daily - 7 x weekly - 2 sets - 2 reps - 30 hold - Supine Isometric Shoulder Extension with Towel  - 1 x daily - 7 x weekly - 2 sets - 10 reps - 5 hold - Supine Isometric Shoulder Adduction with Towel Roll  - 1 x daily - 7 x weekly - 2 sets - 10 reps - 5 hold - Seated Bilateral Shoulder Flexion Towel Slide at Table Top  - 1 x daily - 7 x weekly - 1 sets - 3 reps - 30 hold - Supine Posterior Pelvic Tilt  - 1 x daily - 7 x weekly - 2 sets - 5 reps - 30 hold - Supine LE Neural Mobilization  - 1 x daily - 7 x weekly - 2 sets - 10 reps - 5  hold - Seated Ankle Alphabet  - 1 x daily - 7 x weekly - 2 sets - 1 reps - 5 hold  ASSESSMENT:  CLINICAL IMPRESSION:  Patient tolerates TDN well this session with minimal pain and with excellent response. She has decreased pain and stiffness by end of session. Core and LE strengthening tolerated well. BAPS board is challenging for patient to perform . Pt will continue to benefit from skilled therapy to address remaining deficits in order to improve overall QoL and return to PLOF.      OBJECTIVE IMPAIRMENTS: cardiopulmonary status limiting activity, decreased activity tolerance, decreased mobility, increased fascial restrictions, impaired perceived functional ability, increased muscle spasms, impaired flexibility, impaired UE functional use, improper body mechanics, postural dysfunction, and pain.   ACTIVITY LIMITATIONS: carrying, lifting, bed mobility, bathing, dressing, self feeding, reach over head, and caring for others  PARTICIPATION LIMITATIONS: meal prep, cleaning, laundry, personal finances, interpersonal relationship, driving, shopping, community activity, yard work, church, and caregiving for husband  PERSONAL FACTORS: Age, Fitness, Past/current experiences, Time since onset of injury/illness/exacerbation, and 3+ comorbidities: aorta type A dissection in sept 2011 with aortic valve replacement bypass of R coronary artery, AAA, anemia, anxiety, CAD, CHF, depression, GERD, Heart failure, HLD, HTN, IBS, Kidney problems, migraine, osteoarthritis, osteoporosis, RA, ocular migraines, back pain  are also affecting patient's functional outcome.   REHAB POTENTIAL: Fair    CLINICAL DECISION MAKING: Evolving/moderate complexity  EVALUATION COMPLEXITY: Moderate   GOALS: Goals reviewed with patient? Yes  SHORT TERM GOALS: Target date: 12/27/2023  Patient will be independent in home exercise program to improve strength/mobility for better functional independence with ADLs. Baseline:7/23:  HEP given 9/16: compliant 1x/week  Goal status: On going     LONG TERM GOALS: Target date: 03/22/2024   Patient will increase FOTO score to equal to or greater than  57%   to demonstrate statistically significant improvement in mobility and quality of life.  Baseline: 7/22: 46 9/16: 50% 10/7: 40% 12/10: 57% Goal status: MET  2.  Patient will improve shoulder AROM to > 140 degrees of flexion, scaption, and abduction for improved ability to perform overhead activities. Baseline: 7/22: see above 9/16: see above 10/7 : defer to exacerbation 12/10: shoulder abduction >140, flexion < 140 , assess scaption next visit 2/6: flexion >140, abduction >140 scaption >140 Goal status: MET  3.  Patient will demonstrate adequate shoulder ROM and strength to be able to shave and dress independently with pain less than 3/10. Baseline: 7/23: 6/10 pain with dressing, limited ROM, unable to test strength 9/16: 3/10 pain with dressing  Goal status: MET  4.  Patient will decrease Quick DASH score by >  8 points demonstrating reduced self-reported upper extremity disability for L and R shoulder  Baseline: 7/23: L shoulder 50% 9/16: R=54.54 % L 66% L 81% R 24% 12/10: R: 43.2% L: 56.8% 2/6: L 22%  R 34%  Goal status: MET  5.  Patient will be able to eat an entire meal without resting arm demonstrating improved functional strength Baseline: 7/23: has to rest while eating due to pain. 9/16: able to make halfway through meal 10/7: can make it about halfway through meal, some meals can get 3/4s through meal 12/10: improved, still has to rest every once in a while 2/6: can get halfway to 2/3rds of the meal.  Goal status: Partially Met  6.  Patient will be able to stand 15 minutes without her LLE going numb allowing for ADLS such as showering and iADLs such as cooking Baseline: 5 minutes 12/10: will assess next visit 2/6: 10 minutes  Goal status: IN PROGRESS  7. Patient will increase six minute walk test distance to  >1500 for progression to community ambulator and improve gait ability Baseline: 1160 ft  12/10: 1010 ft 2/6: 1140 ft  Goal status: IN PROGRESS  8  Patient will reduce modified Oswestry score to <20 as to demonstrate minimal disability with ADLs including improved sleeping tolerance, walking/sitting tolerance etc for better mobility with ADLs.  Baseline: 2/6: 60% Goal status: New PLAN:  PT FREQUENCY: 2x/week  PT DURATION: 8 weeks  PLANNED INTERVENTIONS: Therapeutic exercises, Therapeutic activity, Neuromuscular re-education, Balance training, Gait training, Patient/Family education, Self Care, Joint mobilization, Vestibular training, Canalith repositioning, Visual/preceptual remediation/compensation, Dry Needling, Electrical stimulation, Spinal mobilization, Cryotherapy, Moist heat, Compression bandaging, scar mobilization, Splintting, Taping, Traction, Ultrasound, Biofeedback, Ionotophoresis 4mg /ml Dexamethasone, Manual therapy, and Re-evaluation  PLAN FOR NEXT SESSION:   Continue with LUE strengthening  L ankle strengthening, distraction, core activation, reassess scaption and flexion ROM, make HEP     Precious Bard PT  Physical Therapist - [facility]  [facility]  02/23/24, 1:59 PM

## 2024-02-23 ENCOUNTER — Other Ambulatory Visit: Payer: Self-pay | Admitting: Family Medicine

## 2024-02-23 ENCOUNTER — Ambulatory Visit: Payer: Medicare HMO

## 2024-02-23 DIAGNOSIS — M6281 Muscle weakness (generalized): Secondary | ICD-10-CM | POA: Diagnosis not present

## 2024-02-23 DIAGNOSIS — R2689 Other abnormalities of gait and mobility: Secondary | ICD-10-CM

## 2024-02-23 DIAGNOSIS — M25612 Stiffness of left shoulder, not elsewhere classified: Secondary | ICD-10-CM | POA: Diagnosis not present

## 2024-02-23 DIAGNOSIS — R278 Other lack of coordination: Secondary | ICD-10-CM | POA: Diagnosis not present

## 2024-02-23 DIAGNOSIS — M5459 Other low back pain: Secondary | ICD-10-CM | POA: Diagnosis not present

## 2024-02-23 DIAGNOSIS — M25512 Pain in left shoulder: Secondary | ICD-10-CM | POA: Diagnosis not present

## 2024-02-23 DIAGNOSIS — R293 Abnormal posture: Secondary | ICD-10-CM

## 2024-02-23 DIAGNOSIS — G8929 Other chronic pain: Secondary | ICD-10-CM | POA: Diagnosis not present

## 2024-02-23 DIAGNOSIS — M25511 Pain in right shoulder: Secondary | ICD-10-CM | POA: Diagnosis not present

## 2024-02-23 DIAGNOSIS — F418 Other specified anxiety disorders: Secondary | ICD-10-CM

## 2024-02-24 NOTE — Telephone Encounter (Signed)
 Last OV was 02/08/24  Both filled on 06/09/23 #90 caps/ 2 refills

## 2024-02-27 NOTE — Therapy (Signed)
 OUTPATIENT PHYSICAL THERAPY TREATMENT      Patient Name: Rachel Torres MRN: [id number] DOB:May 04, 1956, 68 y.o., female Today's Date: 02/28/2024  END OF SESSION:  PT End of Session - 02/28/24 1503     Visit Number 23    Number of Visits 35    Date for PT Re-Evaluation 03/22/24    Authorization Type [employer]  Medicare HMO    Authorization Time Period 09/05/23-10/31/23    Progress Note Due on Visit 10    PT Start Time 1502    PT Stop Time 1547    PT Time Calculation (min) 45 min    Equipment Utilized During Treatment Gait belt    Activity Tolerance Patient tolerated treatment well    Behavior During Therapy WFL for tasks assessed/performed                              Past Medical History:  Diagnosis Date   AAA (abdominal aortic aneurysm) (HCC)    a. Chronic w/o evidence of aneurysmal dil on CTA 01/2016.   Alcohol abuse    Allergy    Anemia    Anxiety    Asthma    CAD (coronary artery disease)    a. 08/2010 s/p CABG x 1 (VG->RCA) @ time of Ao dissection repair; b. 01/2016 Lexiscan MV: mid antsept/apical defect w/ ? peri-infarct ischemia-->likely attenuation-->Med Rx.   Chewing difficulty    Chronic combined systolic (congestive) and diastolic (congestive) heart failure (HCC)    a. 2012 EF 30-35%; b. 04/2015 EF 40-45%; c. 01/2016 Echo: Ef 55-65%, nl AoV bioprosthesis, nl RV, nl PASP.   Constipation    Depression    Edema of both lower extremities    Gallbladder sludge    GERD (gastroesophageal reflux disease)    Heart failure (HCC)    Heart valve problem    History of Bicuspid Aortic Valve    a. 08/2010 s/p AVR @ time of Ao dissection repair; b. 01/2016 Echo: Ef 55-65%, nl AoV bioprosthesis, nl RV, nl PASP.   Hx of repair of dissecting thoracic aortic aneurysm, Stanford type A    a. 08/2010 s/p repair with AVR and VG->RCA; b. 01/2016 CTA: stable appearance of Asc Thoracic Aortic graft. Opacification of flase lumen of chronic abd Ao dissection w/ retrograde  flow through lumbar arteries. No aneurysmal dil of flase lumen.   Hyperlipidemia    Hypertension    Hypertensive heart disease    IBS (irritable bowel syndrome)    Internal hemorrhoids    Kidney problem    Marfan syndrome    pt denies   Migraine    Obstructive sleep apnea    Osteoarthritis    Osteoporosis    Palpitations    Pneumonia    RA (rheumatoid arthritis) (HCC)    a. Followed by Dr. Mallie Mussel   Past Surgical History:  Procedure Laterality Date   ABDOMINAL AORTIC ANEURYSM REPAIR     CARDIAC CATHETERIZATION  2001   CESAREAN SECTION  1986 & 1991   CHOLECYSTECTOMY     COLONOSCOPY     COLONOSCOPY WITH PROPOFOL N/A 01/19/2024   Procedure: COLONOSCOPY WITH PROPOFOL;  Surgeon: Wyline Mood, MD;  Location: [facility] ENDOSCOPY;  Service: Gastroenterology;  Laterality: N/A;   ESOPHAGOGASTRODUODENOSCOPY (EGD) WITH PROPOFOL N/A 05/08/2020   Procedure: ESOPHAGOGASTRODUODENOSCOPY (EGD) WITH PROPOFOL;  Surgeon: Pasty Spillers, MD;  Location: [facility] ENDOSCOPY;  Service: Endoscopy;  Laterality: N/A;   FRACTURE SURGERY Right  shoulder replacement   HEMORRHOID BANDING     ORIF DISTAL RADIUS FRACTURE Left    POLYPECTOMY  01/19/2024   Procedure: POLYPECTOMY;  Surgeon: Wyline Mood, MD;  Location: [facility] ENDOSCOPY;  Service: Gastroenterology;;   UPPER GASTROINTESTINAL ENDOSCOPY     Patient Active Problem List   Diagnosis Date Noted   Adenomatous polyp of colon 01/19/2024   Heart failure with improved ejection fraction (HFimpEF) (HCC) 01/18/2024   Abnormal metabolism 11/14/2023   Weight regain after inital weight loss 11/14/2023   Multiple closed tarsal fractures of left foot, sequela 07/12/2023   Arm contusion, right, sequela 07/12/2023   Lumbar disc disease 07/12/2023   PAF (paroxysmal atrial fibrillation) (HCC) 06/09/2023   Paresthesia 06/03/2023   Obesity, Beginning BMI 35.45 02/17/2023   Tinnitus 01/14/2023   Change in vision 01/14/2023   Encounter for screening colonoscopy 01/14/2023    Personal history of fall 01/14/2023   Problem related to unspecified psychosocial circumstances 12/28/2022   Person encountering health services to consult on behalf of another person 12/28/2022   Palpitation 12/10/2022   Fatigue 10/26/2022   B12 deficiency 10/26/2022   Right shoulder pain 09/15/2022   Depression 09/14/2022   Other constipation 05/12/2022   Medicare annual wellness visit, initial 07/17/2021   TMJ (dislocation of temporomandibular joint) 07/17/2021   Encounter for screening mammogram for breast cancer 06/05/2021   Diarrhea 04/11/2020   Epigastric pain 02/19/2020   Dark stools 02/19/2020   History of atrial fibrillation 02/05/2019   Shortness of breath 01/27/2019   Irregular surface of cornea 12/25/2018   Acute cough 09/08/2017   History of HPV infection 12/01/2016   Screening mammogram, encounter for 11/29/2016   Estrogen deficiency 11/29/2016   Encounter for routine gynecological examination 11/29/2016   Grade III hemorrhoids 10/06/2016   Tachycardia 08/14/2016   Hemorrhoids 08/13/2016   Atypical migraine 08/03/2016   CAD (coronary artery disease)    AAA (abdominal aortic aneurysm) (HCC)    Chronic combined systolic (congestive) and diastolic (congestive) heart failure (HCC)    Prediabetes 02/11/2016   Generalized anxiety disorder 12/08/2015   Panic attacks 07/28/2015   Chest pain 07/09/2013   Sensorineural hearing loss 03/27/2013   Vitamin D deficiency 07/03/2012   H/O aortic dissection 05/12/2012   Routine general medical examination at a health care facility 09/20/2011   Atypical chest pain 09/15/2011   S/P AVR (aortic valve replacement) 01/07/2011   CORONARY ARTERY BYPASS GRAFT, HX OF 01/07/2011   Arthritis 12/20/2010   Back pain 12/15/2010   H/O dissecting abdominal aortic aneurysm repair 08/24/2010   IRRITABLE BOWEL SYNDROME 09/09/2009   Obstructive sleep apnea 08/04/2009   External hemorrhoid 06/26/2009   Osteoporosis 01/09/2009    Hyperlipidemia 07/26/2007   Depression with anxiety 07/26/2007   Essential hypertension 07/26/2007   Allergic rhinitis 07/26/2007   Asthma 07/26/2007   GERD 07/26/2007   Osteoarthritis 07/26/2007   Ocular migraine 07/26/2007   Mild intermittent asthma without complication 07/26/2007    PCP: [name] MD   REFERRING PROVIDER: Stormy Card  REFERRING DIAG: L rotator cuff   THERAPY DIAG:  Stiffness of left shoulder, not elsewhere classified  Other abnormalities of gait and mobility  Abnormal posture  Other low back pain  Left shoulder pain, unspecified chronicity  Rationale for Evaluation and Treatment: Rehabilitation  ONSET DATE: Feb 2024  SUBJECTIVE:  SUBJECTIVE STATEMENT: Patient reports high stress, has to have two teeth pulled, husband been up since early morning (patient is caregiver). Patient's left hip continues to hurt  Hand dominance: Left  PERTINENT HISTORY: Rachel Torres is a 66yoF familiar to our services. Pt referred to PT for strengthening of chronic left shoulder DJD, known remote RC injury. Recently fell and broke four metatarsals, since cleared from use of CAM rocker. She was treated successfully in past for right shoulder pain and has past medical history of Right shoulder surgery replacement. 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 PMH aorta type A dissection in sept 2011 s/p aortic valve replacement, bypass of R coronary artery, anemia, anxiety, CAD, CHF, depression, GERD, Heart failure, HLD, HTN, IBS, migraine, osteoarthritis, osteoporosis, RA, back pain.    Back pain:  Patient has new referral for radiculopathy, lumbar region. MRI showed degeneration of L1-2 and L2-3 with some disc space collapse and minor retrolisthesis of L2-3. Issue began in February, felt  numbness in L thigh. No cause known, but she was at a museum and noticed it was numb. Sometimes L foot numbness and toe numbness of 2nd and 3rd toe causing tripping. This caused her to trip July 4th and break five metatarsals. Some minor pain in the low pain noted. Pain noted to be 2-3/10 Pain in hip 3-4/10 of L hip.    PAIN:  Are you having pain? Yes, 3/10 pain in lateral deltoids in the shoulder.  PRECAUTIONS: None  FALLS:  Has patient fallen in last 6 months? Yes. Number of falls 2  PATIENT GOALS:to lift more than 3lb with LUE, be able to eat a meal without arm being tired.    OBJECTIVE:  DIAGNOSTIC FINDINGS:  IMPRESSION: Mild degenerative change in the cervical spine without acute fracture or traumatic subluxation.   IMPRESSION: Full width, full width tears of the supraspinatus and infraspinatus tendons with retraction to the glenoid. Grade 2/3 infraspinatus atrophy.   Moderate tendinosis of the distal subscapularis tendon with articular sided fraying of the cephalad fibers.   Moderate intra-articular long head biceps tendinosis with fraying and possible partial tearing.   Mild glenohumeral and moderate AC joint osteoarthritis.   Degenerative superior labral tearing anteriorly and posteriorly.   PATIENT SURVEYS:  Quick Dash 50%  and FOTO 46%   COGNITION: Overall cognitive status: Within functional limits for tasks assessed                                  SENSATION: WFL   POSTURE: Slight rounded shoulders and shoulder elevation    UPPER EXTREMITY ROM:    Active ROM Right eval 9/16: recert RUE Left eval 9/16 RECERT LUE  Shoulder flexion 64 122 91 92  Shoulder extension        Shoulder abduction 95 88 52 82  Shoulder adduction        Shoulder internal rotation Unable to test at 90 61 degrees 5 40  Shoulder external rotation 18at side of body -5 degrees at 90 degrees 34 35  Elbow flexion [facility]  WFL   Elbow extension [facility]  WFL   Wrist flexion        Wrist  extension        Wrist ulnar deviation        Wrist radial deviation        Wrist pronation        Wrist supination        (  Blank rows = not tested)   UPPER EXTREMITY MMT:   MMT Right eval Left eval  Shoulder flexion      Shoulder extension      Shoulder abduction      Shoulder adduction      Shoulder internal rotation      Shoulder external rotation      Middle trapezius      Lower trapezius      Elbow flexion      Elbow extension      Wrist flexion      Wrist extension      Wrist ulnar deviation      Wrist radial deviation      Wrist pronation      Wrist supination      Grip strength (lbs) 30 35  (Blank rows = not tested) Unable to test strength due to pain with motion   TRUNK ROM: Trunk Flexion 65  Trunk Extension 7  Trunk R SB Limited 25%  Trunk L SB WFL  Trunk R rotation WFL  Trunk L rotation Limited 25%    Ankle ROM Seated: Motion: AROM seated Right Left  Plantarflexion 4+ 3  Dorsiflexion 4+ 4  Inversion 4 3+  Eversion 4+ 3+    SHOULDER SPECIAL TESTS: Impingement tests:  unable to test due to pain in starting position SLAP lesions:  unable to test due to pain in starting position Instability tests:  unable to test due to pain in starting position Rotator cuff assessment:  unable to test due to pain in starting position Biceps assessment:  unable to test due to pain in starting position   LOW BACK SPECIAL TEST: SLR: negative  Scour test: negative FABER: negative Pelvic compression/distraction: negative  Slump test positive LLE  6 min walk test: 1160 ft increased left foot drag and antalgic gait pattern with foot eversion    JOINT MOBILITY TESTING:  AP: painful PA: painful Inferior: painful Distraction: reduces pain   Back mobilization hypomobile and painful to grade II mobilizations    PALPATION:  Tenderness to all joint regions of GH     TODAY'S TREATMENT: DATE: 02/28/24    Manual:  Grade II mobilizations thoracic and lumbar  spine x 8 minutes Prone hip flexor lengthening 30 seconds each LE  Grade II inferior SAD 3x30 Grade II inferior SAD with figure four position 3x30 seconds   TherAct Supine: -thoracic rotation 10x  -nerve glide 25x each LE  Child pose 30 seconds x 2 trials  Standing march 10x  Wall posture:  -thoracic rotation  10x ; 2 sets ; start in half kneeling; painful on knees so moved to seated next to wall  -wall posture seated 10x holds 5 seconds   BAPS board LLE; -df/pf 10x -eversion/inversion 10x  -circle clockwise 10x , counterclockwise 10x    Trigger Point Dry Needling  Subsequent Treatment: Instructions provided previously at initial dry needling treatment.   Patient Verbal Consent Given: Yes Education Handout Provided: Previously Provided Muscles Treated: bilateral glutes and piriformis  Electrical Stimulation Performed: No Treatment Response/Outcome: tolerated well    PATIENT EDUCATION: Education details: goals, POC  Person educated: Patient Education method: Explanation, Demonstration, Tactile cues, Verbal cues, and Handouts Education comprehension: verbalized understanding, returned demonstration, verbal cues required, and tactile cues required  HOME EXERCISE PROGRAM:  Access Code: [email or web address] URL: [email or web address] Date: 10/03/2023 Prepared by: Precious Bard  Exercises - Seated Scapular Retraction  - 1 x daily - 7 x weekly - 2 sets - 10  reps - 5 hold - Neck Sidebending  - 1 x daily - 7 x weekly - 2 sets - 2 reps - 30 hold - Supine Isometric Shoulder Extension with Towel  - 1 x daily - 7 x weekly - 2 sets - 10 reps - 5 hold - Supine Isometric Shoulder Adduction with Towel Roll  - 1 x daily - 7 x weekly - 2 sets - 10 reps - 5 hold - Seated Bilateral Shoulder Flexion Towel Slide at Table Top  - 1 x daily - 7 x weekly - 1 sets - 3 reps - 30 hold - Supine Posterior Pelvic Tilt  - 1 x daily - 7 x weekly - 2 sets - 5 reps - 30 hold - Supine LE Neural  Mobilization  - 1 x daily - 7 x weekly - 2 sets - 10 reps - 5 hold - Seated Ankle Alphabet  - 1 x daily - 7 x weekly - 2 sets - 1 reps - 5 hold  ASSESSMENT:  CLINICAL IMPRESSION:  Patient has improved reaction to inferior distraction with report of decreased pain. She is eager to progress her pain free mobility. Patient tolerates wall mobility exercise for increased thoracic rotation this session. Ankle control of L ankle is improving. Pt will continue to benefit from skilled therapy to address remaining deficits in order to improve overall QoL and return to PLOF.      OBJECTIVE IMPAIRMENTS: cardiopulmonary status limiting activity, decreased activity tolerance, decreased mobility, increased fascial restrictions, impaired perceived functional ability, increased muscle spasms, impaired flexibility, impaired UE functional use, improper body mechanics, postural dysfunction, and pain.   ACTIVITY LIMITATIONS: carrying, lifting, bed mobility, bathing, dressing, self feeding, reach over head, and caring for others  PARTICIPATION LIMITATIONS: meal prep, cleaning, laundry, personal finances, interpersonal relationship, driving, shopping, community activity, yard work, church, and caregiving for husband  PERSONAL FACTORS: Age, Fitness, Past/current experiences, Time since onset of injury/illness/exacerbation, and 3+ comorbidities: aorta type A dissection in sept 2011 with aortic valve replacement bypass of R coronary artery, AAA, anemia, anxiety, CAD, CHF, depression, GERD, Heart failure, HLD, HTN, IBS, Kidney problems, migraine, osteoarthritis, osteoporosis, RA, ocular migraines, back pain  are also affecting patient's functional outcome.   REHAB POTENTIAL: Fair    CLINICAL DECISION MAKING: Evolving/moderate complexity  EVALUATION COMPLEXITY: Moderate   GOALS: Goals reviewed with patient? Yes  SHORT TERM GOALS: Target date: 12/27/2023  Patient will be independent in home exercise program to  improve strength/mobility for better functional independence with ADLs. Baseline:7/23: HEP given 9/16: compliant 1x/week  Goal status: On going     LONG TERM GOALS: Target date: 03/22/2024   Patient will increase FOTO score to equal to or greater than  57%   to demonstrate statistically significant improvement in mobility and quality of life.  Baseline: 7/22: 46 9/16: 50% 10/7: 40% 12/10: 57% Goal status: MET  2.  Patient will improve shoulder AROM to > 140 degrees of flexion, scaption, and abduction for improved ability to perform overhead activities. Baseline: 7/22: see above 9/16: see above 10/7 : defer to exacerbation 12/10: shoulder abduction >140, flexion < 140 , assess scaption next visit 2/6: flexion >140, abduction >140 scaption >140 Goal status: MET  3.  Patient will demonstrate adequate shoulder ROM and strength to be able to shave and dress independently with pain less than 3/10. Baseline: 7/23: 6/10 pain with dressing, limited ROM, unable to test strength 9/16: 3/10 pain with dressing  Goal status: MET  4.  Patient  will decrease Quick DASH score by > 8 points demonstrating reduced self-reported upper extremity disability for L and R shoulder  Baseline: 7/23: L shoulder 50% 9/16: R=54.54 % L 66% L 81% R 24% 12/10: R: 43.2% L: 56.8% 2/6: L 22%  R 34%  Goal status: MET  5.  Patient will be able to eat an entire meal without resting arm demonstrating improved functional strength Baseline: 7/23: has to rest while eating due to pain. 9/16: able to make halfway through meal 10/7: can make it about halfway through meal, some meals can get 3/4s through meal 12/10: improved, still has to rest every once in a while 2/6: can get halfway to 2/3rds of the meal.  Goal status: Partially Met  6.  Patient will be able to stand 15 minutes without her LLE going numb allowing for ADLS such as showering and iADLs such as cooking Baseline: 5 minutes 12/10: will assess next visit 2/6: 10 minutes   Goal status: IN PROGRESS  7. Patient will increase six minute walk test distance to >1500 for progression to community ambulator and improve gait ability Baseline: 1160 ft  12/10: 1010 ft 2/6: 1140 ft  Goal status: IN PROGRESS  8  Patient will reduce modified Oswestry score to <20 as to demonstrate minimal disability with ADLs including improved sleeping tolerance, walking/sitting tolerance etc for better mobility with ADLs.  Baseline: 2/6: 60% Goal status: New PLAN:  PT FREQUENCY: 2x/week  PT DURATION: 8 weeks  PLANNED INTERVENTIONS: Therapeutic exercises, Therapeutic activity, Neuromuscular re-education, Balance training, Gait training, Patient/Family education, Self Care, Joint mobilization, Vestibular training, Canalith repositioning, Visual/preceptual remediation/compensation, Dry Needling, Electrical stimulation, Spinal mobilization, Cryotherapy, Moist heat, Compression bandaging, scar mobilization, Splintting, Taping, Traction, Ultrasound, Biofeedback, Ionotophoresis 4mg /ml Dexamethasone, Manual therapy, and Re-evaluation  PLAN FOR NEXT SESSION:   Continue with LUE strengthening  L ankle strengthening, distraction, core activation, reassess scaption and flexion ROM, make HEP     Precious Bard PT  Physical Therapist - [facility]  [facility]  02/28/24, 3:52 PM

## 2024-02-28 ENCOUNTER — Ambulatory Visit: Payer: Medicare HMO

## 2024-02-28 ENCOUNTER — Ambulatory Visit: Payer: Medicare HMO | Admitting: Psychology

## 2024-02-28 DIAGNOSIS — M6281 Muscle weakness (generalized): Secondary | ICD-10-CM | POA: Diagnosis not present

## 2024-02-28 DIAGNOSIS — M25512 Pain in left shoulder: Secondary | ICD-10-CM

## 2024-02-28 DIAGNOSIS — F331 Major depressive disorder, recurrent, moderate: Secondary | ICD-10-CM | POA: Diagnosis not present

## 2024-02-28 DIAGNOSIS — R278 Other lack of coordination: Secondary | ICD-10-CM | POA: Diagnosis not present

## 2024-02-28 DIAGNOSIS — M5459 Other low back pain: Secondary | ICD-10-CM | POA: Diagnosis not present

## 2024-02-28 DIAGNOSIS — G8929 Other chronic pain: Secondary | ICD-10-CM | POA: Diagnosis not present

## 2024-02-28 DIAGNOSIS — R293 Abnormal posture: Secondary | ICD-10-CM | POA: Diagnosis not present

## 2024-02-28 DIAGNOSIS — R2689 Other abnormalities of gait and mobility: Secondary | ICD-10-CM | POA: Diagnosis not present

## 2024-02-28 DIAGNOSIS — M25511 Pain in right shoulder: Secondary | ICD-10-CM | POA: Diagnosis not present

## 2024-02-28 DIAGNOSIS — M25612 Stiffness of left shoulder, not elsewhere classified: Secondary | ICD-10-CM | POA: Diagnosis not present

## 2024-02-28 NOTE — Progress Notes (Signed)
 [facility] Counselor/Therapist Progress Note  Patient ID: Rachel Torres, MRN: [id number],    Date: 02/28/2024  Time Spent: 10:00am-10:50am  50 minutes   Treatment Type: Individual Therapy  Reported Symptoms: stress  Mental Status Exam: Appearance:  Casual     Behavior: Appropriate  Motor: Normal  Speech/Language:  Normal Rate  Affect: Appropriate  Mood: normal  Thought process: normal  Thought content:   WNL  Sensory/Perceptual disturbances:   WNL  Orientation: oriented to person, place, time/date, and situation  Attention: Good  Concentration: Good  Memory: WNL  Fund of knowledge:  Good  Insight:   Good  Judgment:  Good  Impulse Control: Good   Risk Assessment: Danger to Self:  No Self-injurious Behavior: No Danger to Others: No Duty to Warn:no Physical Aggression / Violence:No  Access to Firearms a concern: No  Gang Involvement:No   Subjective: Pt present for face-to-face individual therapy via video.  Pt consents to telehealth video session and is aware of limitations and benefits of virtual sessions.  Location of pt: home Location of therapist: home office.  Pt talked about having a frustrating morning.  Rachel Torres woke up early and is getting into things that he shouldn't get into.  Addressed pt's frustrations.  Pt has not slept well in a week and she is exhausted.   Pt talked about caregiving for Rachel Torres.   Rachel Torres has been having some behaviors that have been challenging to deal with.  He is not using his walker like he should and pt is worried about him falling.  Worked with pt on problem solving and stress management.  Pt states the house renovations have been stressful bc it has taken so long.  Pt is going out of town next week to see her son and grandson.  This trip with be good respite for her.   Worked on self care strategies.  Provided supportive therapy.  Interventions: Cognitive Behavioral Therapy and Insight-Oriented  Diagnosis:   F33.1  Plan of Care: Recommend ongoing therapy.   Pt participated in setting treatment goals.   Pt needs a place to talk about stressors.  Plan to meet every two weeks.    Treatment Plan (target date: 03/23/2024) Client Abilities/Strengths  Pt is bright, engaging, and motivated for therapy.   Client Treatment Preferences  Individual therapy.  Client Statement of Needs  Improve coping skills.  Have a place to talk about stressors.   Symptoms  Depressed or irritable mood. Unresolved grief issues.  Problems Addressed  Unipolar Depression Goals 1. Alleviate depressive symptoms and return to previous level of effective functioning. 2. Appropriately grieve the loss in order to normalize mood and to return to previously adaptive level of functioning. Objective Learn and implement behavioral strategies to overcome depression. Target Date: 2024-03-23 Frequency: Biweekly  Progress: 40 Modality: individual  Related Interventions Engage the client in "behavioral activation," increasing his/her activity level and contact with sources of reward, while identifying processes that inhibit activation.  Use behavioral techniques such as instruction, rehearsal, role-playing, role reversal, as needed, to facilitate activity in the client's daily life; reinforce success. Assist the client in developing skills that increase the likelihood of deriving pleasure from behavioral activation (e.g., assertiveness skills, developing an exercise plan, less internal/more external focus, increased social involvement); reinforce success. Objective Identify important people in life, past and present, and describe the quality, good and poor, of those relationships. Target Date: 2024-03-23 Frequency: Biweekly  Progress: 40 Modality: individual  Related Interventions Conduct Interpersonal  Therapy beginning with the assessment of the client's "interpersonal inventory" of important past and present relationships; develop a  case formulation linking depression to grief, interpersonal role disputes, role transitions, and/or interpersonal deficits). Objective Learn and implement problem-solving and decision-making skills. Target Date: 2024-03-23 Frequency: Biweekly  Progress: 40 Modality: individual  Related Interventions Conduct Problem-Solving Therapy using techniques such as psychoeducation, modeling, and role-playing to teach client problem-solving skills (i.e., defining a problem specifically, generating possible solutions, evaluating the pros and cons of each solution, selecting and implementing a plan of action, evaluating the efficacy of the plan, accepting or revising the plan); role-play application of the problem-solving skill to a real life issue. Encourage in the client the development of a positive problem orientation in which problems and solving them are viewed as a natural part of life and not something to be feared, despaired, or avoided. 3. Develop healthy interpersonal relationships that lead to the alleviation and help prevent the relapse of depression. 4. Develop healthy thinking patterns and beliefs about self, others, and the world that lead to the alleviation and help prevent the relapse of depression. 5. Recognize, accept, and cope with feelings of depression. Diagnosis F33.1  Conditions For Discharge Achievement of treatment goals and objectives   Salomon Fick, LCSW

## 2024-02-29 ENCOUNTER — Telehealth: Payer: Self-pay | Admitting: Family Medicine

## 2024-02-29 NOTE — Telephone Encounter (Signed)
 This needs to go to her cardiologist  In IN box  Sounds like she need this asap   Thanks

## 2024-02-29 NOTE — Telephone Encounter (Signed)
 Spoke with [employer] and advised them of Dr. Royden Purl comments. They will f/u with pt's cardiologist

## 2024-02-29 NOTE — Telephone Encounter (Signed)
 Copied from CRM [id number]. Topic: General - Other >> Feb 29, 2024 11:30 AM Martinique E wrote: Reason for CRM: Aisha from [employer] called regarding a fax that she sent over on 3/10. It was a medical release form for the patient's extraction that will happen on 3/17. [facility] will need this form by tomorrow 3/13. Callback number [phone] option 2.

## 2024-03-01 ENCOUNTER — Ambulatory Visit: Payer: Medicare HMO

## 2024-03-01 ENCOUNTER — Telehealth: Payer: Self-pay | Admitting: *Deleted

## 2024-03-01 NOTE — Telephone Encounter (Signed)
   Pre-operative Risk Assessment    Patient Name: Rachel Torres  DOB: 10/11/1956 MRN: [id number]   Date of last office visit: 01/16/24 TELE PREOP APPT; 06/17/23 DR. GOLLAN Date of next office visit: NONE  Request for Surgical Clearance    Procedure:  Dental Extraction - Amount of Teeth to be Pulled:  2 TEETH SURGICAL EXTRACTION WITH GRAFT AND MEMBRANE  Date of Surgery:  Clearance TBD                                Surgeon:  DR. Richardson Dopp, DMD Surgeon's Group or Practice Name:  [facility]  Phone number:  [phone] Fax number:  [phone]   Type of Clearance Requested:   - Medical  - Pharmacy:  Hold Aspirin and Apixaban (Eliquis)     Type of Anesthesia:  Local    Additional requests/questions:    Elpidio Anis   03/01/2024, 10:12 AM

## 2024-03-02 NOTE — Telephone Encounter (Signed)
 Office calling to f/u on Clearance for pt who is scheduled to come in for procedure on Monday. Please advise

## 2024-03-02 NOTE — Telephone Encounter (Signed)
 Patient with diagnosis of atrial fibrillation on Eliquis for anticoagulation.    Procedure:  Dental Extraction - Amount of Teeth to be Pulled:  2 TEETH SURGICAL EXTRACTION WITH GRAFT AND MEMBRANE   Date of Surgery:  Clearance TBD       CHA2DS2-VASc Score = 5   This indicates a 7.2% annual risk of stroke. The patient's score is based upon: CHF History: 1 HTN History: 1 Diabetes History: 0 Stroke History: 0 Vascular Disease History: 1 Age Score: 1 Gender Score: 1   CrCl 93 Platelet count 257  Patient DOES require pre-op antibiotics for dental procedure.  Per office protocol, patient can hold Eliquis for 2 days prior to procedure.   Patient will not need bridging with Lovenox (enoxaparin) around procedure.  **This guidance is not considered finalized until pre-operative APP has relayed final recommendations.**

## 2024-03-02 NOTE — Telephone Encounter (Signed)
   Patient Name: Rachel Torres  DOB: 24-Nov-1956 MRN: [id number]  Primary Cardiologist: Julien Nordmann, MD  Chart reviewed as part of pre-operative protocol coverage. Given past medical history and time since last visit, based on ACC/AHA guidelines, Remedy C Nuss is at acceptable risk for the planned procedure without further cardiovascular testing.   Patient DOES require pre-op antibiotics for dental procedure.   Per office protocol, patient can hold Eliquis for 2 days prior to procedure.   Patient will not need bridging with Lovenox (enoxaparin) around procedure. Please resume Eliquis as soon as possible postprocedure, at the discretion of the surgeon.    I will route this recommendation to the requesting party via Epic fax function and remove from pre-op pool.  Please call with questions.  Joylene Grapes, NP 03/02/2024, 9:58 AM

## 2024-03-05 NOTE — Therapy (Signed)
 OUTPATIENT PHYSICAL THERAPY TREATMENT      Patient Name: Rachel Torres MRN: [id number] DOB:29-Nov-1956, 68 y.o., female Today's Date: 03/06/2024  END OF SESSION:  PT End of Session - 03/06/24 1309     Visit Number 24    Number of Visits 35    Date for PT Re-Evaluation 03/22/24    Authorization Type [employer]  Medicare HMO    Authorization Time Period 09/05/23-10/31/23    Progress Note Due on Visit 10    PT Start Time 1314    PT Stop Time 1359    PT Time Calculation (min) 45 min    Equipment Utilized During Treatment Gait belt    Activity Tolerance Patient tolerated treatment well    Behavior During Therapy WFL for tasks assessed/performed                               Past Medical History:  Diagnosis Date   AAA (abdominal aortic aneurysm) (HCC)    a. Chronic w/o evidence of aneurysmal dil on CTA 01/2016.   Alcohol abuse    Allergy    Anemia    Anxiety    Asthma    CAD (coronary artery disease)    a. 08/2010 s/p CABG x 1 (VG->RCA) @ time of Ao dissection repair; b. 01/2016 Lexiscan MV: mid antsept/apical defect w/ ? peri-infarct ischemia-->likely attenuation-->Med Rx.   Chewing difficulty    Chronic combined systolic (congestive) and diastolic (congestive) heart failure (HCC)    a. 2012 EF 30-35%; b. 04/2015 EF 40-45%; c. 01/2016 Echo: Ef 55-65%, nl AoV bioprosthesis, nl RV, nl PASP.   Constipation    Depression    Edema of both lower extremities    Gallbladder sludge    GERD (gastroesophageal reflux disease)    Heart failure (HCC)    Heart valve problem    History of Bicuspid Aortic Valve    a. 08/2010 s/p AVR @ time of Ao dissection repair; b. 01/2016 Echo: Ef 55-65%, nl AoV bioprosthesis, nl RV, nl PASP.   Hx of repair of dissecting thoracic aortic aneurysm, Stanford type A    a. 08/2010 s/p repair with AVR and VG->RCA; b. 01/2016 CTA: stable appearance of Asc Thoracic Aortic graft. Opacification of flase lumen of chronic abd Ao dissection w/  retrograde flow through lumbar arteries. No aneurysmal dil of flase lumen.   Hyperlipidemia    Hypertension    Hypertensive heart disease    IBS (irritable bowel syndrome)    Internal hemorrhoids    Kidney problem    Marfan syndrome    pt denies   Migraine    Obstructive sleep apnea    Osteoarthritis    Osteoporosis    Palpitations    Pneumonia    RA (rheumatoid arthritis) (HCC)    a. Followed by Dr. Mallie Mussel   Past Surgical History:  Procedure Laterality Date   ABDOMINAL AORTIC ANEURYSM REPAIR     CARDIAC CATHETERIZATION  2001   CESAREAN SECTION  1986 & 1991   CHOLECYSTECTOMY     COLONOSCOPY     COLONOSCOPY WITH PROPOFOL N/A 01/19/2024   Procedure: COLONOSCOPY WITH PROPOFOL;  Surgeon: Wyline Mood, MD;  Location[facility] ENDOSCOPY;  Service: Gastroenterology;  Laterality: N/A;   ESOPHAGOGASTRODUODENOSCOPY (EGD) WITH PROPOFOL N/A 05/08/2020   Procedure: ESOPHAGOGASTRODUODENOSCOPY (EGD) WITH PROPOFOL;  Surgeon: Pasty Spillers, MD;  Location: [facility] ENDOSCOPY;  Service: Endoscopy;  Laterality: N/A;   FRACTURE SURGERY Right  shoulder replacement   HEMORRHOID BANDING     ORIF DISTAL RADIUS FRACTURE Left    POLYPECTOMY  01/19/2024   Procedure: POLYPECTOMY;  Surgeon: Wyline Mood, MD;  Location: [facility] ENDOSCOPY;  Service: Gastroenterology;;   UPPER GASTROINTESTINAL ENDOSCOPY     Patient Active Problem List   Diagnosis Date Noted   Adenomatous polyp of colon 01/19/2024   Heart failure with improved ejection fraction (HFimpEF) (HCC) 01/18/2024   Abnormal metabolism 11/14/2023   Weight regain after inital weight loss 11/14/2023   Multiple closed tarsal fractures of left foot, sequela 07/12/2023   Arm contusion, right, sequela 07/12/2023   Lumbar disc disease 07/12/2023   PAF (paroxysmal atrial fibrillation) (HCC) 06/09/2023   Paresthesia 06/03/2023   Obesity, Beginning BMI 35.45 02/17/2023   Tinnitus 01/14/2023   Change in vision 01/14/2023   Encounter for screening colonoscopy  01/14/2023   Personal history of fall 01/14/2023   Problem related to unspecified psychosocial circumstances 12/28/2022   Person encountering health services to consult on behalf of another person 12/28/2022   Palpitation 12/10/2022   Fatigue 10/26/2022   B12 deficiency 10/26/2022   Right shoulder pain 09/15/2022   Depression 09/14/2022   Other constipation 05/12/2022   Medicare annual wellness visit, initial 07/17/2021   TMJ (dislocation of temporomandibular joint) 07/17/2021   Encounter for screening mammogram for breast cancer 06/05/2021   Diarrhea 04/11/2020   Epigastric pain 02/19/2020   Dark stools 02/19/2020   History of atrial fibrillation 02/05/2019   Shortness of breath 01/27/2019   Irregular surface of cornea 12/25/2018   Acute cough 09/08/2017   History of HPV infection 12/01/2016   Screening mammogram, encounter for 11/29/2016   Estrogen deficiency 11/29/2016   Encounter for routine gynecological examination 11/29/2016   Grade III hemorrhoids 10/06/2016   Tachycardia 08/14/2016   Hemorrhoids 08/13/2016   Atypical migraine 08/03/2016   CAD (coronary artery disease)    AAA (abdominal aortic aneurysm) (HCC)    Chronic combined systolic (congestive) and diastolic (congestive) heart failure (HCC)    Prediabetes 02/11/2016   Generalized anxiety disorder 12/08/2015   Panic attacks 07/28/2015   Chest pain 07/09/2013   Sensorineural hearing loss 03/27/2013   Vitamin D deficiency 07/03/2012   H/O aortic dissection 05/12/2012   Routine general medical examination at a health care facility 09/20/2011   Atypical chest pain 09/15/2011   S/P AVR (aortic valve replacement) 01/07/2011   CORONARY ARTERY BYPASS GRAFT, HX OF 01/07/2011   Arthritis 12/20/2010   Back pain 12/15/2010   H/O dissecting abdominal aortic aneurysm repair 08/24/2010   IRRITABLE BOWEL SYNDROME 09/09/2009   Obstructive sleep apnea 08/04/2009   External hemorrhoid 06/26/2009   Osteoporosis 01/09/2009    Hyperlipidemia 07/26/2007   Depression with anxiety 07/26/2007   Essential hypertension 07/26/2007   Allergic rhinitis 07/26/2007   Asthma 07/26/2007   GERD 07/26/2007   Osteoarthritis 07/26/2007   Ocular migraine 07/26/2007   Mild intermittent asthma without complication 07/26/2007    PCP: [name] MD   REFERRING PROVIDER: Stormy Card  REFERRING DIAG: L rotator cuff   THERAPY DIAG:  Stiffness of left shoulder, not elsewhere classified  Other abnormalities of gait and mobility  Abnormal posture  Other low back pain  Rationale for Evaluation and Treatment: Rehabilitation  ONSET DATE: Feb 2024  SUBJECTIVE:  SUBJECTIVE STATEMENT: Patient had two teeth removed yesterday. Is having jaw pain from procedure. Pain in jaw takes away from pain in back. Pain in the leg makes her leg feel weak.   Hand dominance: Left  PERTINENT HISTORY: Maayan Jenning is a 66yoF familiar to our services. Pt referred to PT for strengthening of chronic left shoulder DJD, known remote RC injury. Recently fell and broke four metatarsals, since cleared from use of CAM rocker. She was treated successfully in past for right shoulder pain and has past medical history of Right shoulder surgery replacement. 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 PMH aorta type A dissection in sept 2011 s/p aortic valve replacement, bypass of R coronary artery, anemia, anxiety, CAD, CHF, depression, GERD, Heart failure, HLD, HTN, IBS, migraine, osteoarthritis, osteoporosis, RA, back pain.    Back pain:  Patient has new referral for radiculopathy, lumbar region. MRI showed degeneration of L1-2 and L2-3 with some disc space collapse and minor retrolisthesis of L2-3. Issue began in February, felt numbness in L thigh. No cause known, but she  was at a museum and noticed it was numb. Sometimes L foot numbness and toe numbness of 2nd and 3rd toe causing tripping. This caused her to trip July 4th and break five metatarsals. Some minor pain in the low pain noted. Pain noted to be 2-3/10 Pain in hip 3-4/10 of L hip.    PAIN:  Are you having pain? Yes, 3/10 pain in lateral deltoids in the shoulder.  PRECAUTIONS: None  FALLS:  Has patient fallen in last 6 months? Yes. Number of falls 2  PATIENT GOALS:to lift more than 3lb with LUE, be able to eat a meal without arm being tired.    OBJECTIVE:  DIAGNOSTIC FINDINGS:  IMPRESSION: Mild degenerative change in the cervical spine without acute fracture or traumatic subluxation.   IMPRESSION: Full width, full width tears of the supraspinatus and infraspinatus tendons with retraction to the glenoid. Grade 2/3 infraspinatus atrophy.   Moderate tendinosis of the distal subscapularis tendon with articular sided fraying of the cephalad fibers.   Moderate intra-articular long head biceps tendinosis with fraying and possible partial tearing.   Mild glenohumeral and moderate AC joint osteoarthritis.   Degenerative superior labral tearing anteriorly and posteriorly.   PATIENT SURVEYS:  Quick Dash 50%  and FOTO 46%   COGNITION: Overall cognitive status: Within functional limits for tasks assessed                                  SENSATION: WFL   POSTURE: Slight rounded shoulders and shoulder elevation    UPPER EXTREMITY ROM:    Active ROM Right eval 9/16: recert RUE Left eval 9/16 RECERT LUE  Shoulder flexion 64 122 91 92  Shoulder extension        Shoulder abduction 95 88 52 82  Shoulder adduction        Shoulder internal rotation Unable to test at 90 61 degrees 5 40  Shoulder external rotation 18at side of body -5 degrees at 90 degrees 34 35  Elbow flexion [facility]  WFL   Elbow extension [facility]  WFL   Wrist flexion        Wrist extension        Wrist ulnar deviation         Wrist radial deviation        Wrist pronation        Wrist  supination        (Blank rows = not tested)   UPPER EXTREMITY MMT:   MMT Right eval Left eval  Shoulder flexion      Shoulder extension      Shoulder abduction      Shoulder adduction      Shoulder internal rotation      Shoulder external rotation      Middle trapezius      Lower trapezius      Elbow flexion      Elbow extension      Wrist flexion      Wrist extension      Wrist ulnar deviation      Wrist radial deviation      Wrist pronation      Wrist supination      Grip strength (lbs) 30 35  (Blank rows = not tested) Unable to test strength due to pain with motion   TRUNK ROM: Trunk Flexion 65  Trunk Extension 7  Trunk R SB Limited 25%  Trunk L SB WFL  Trunk R rotation WFL  Trunk L rotation Limited 25%    Ankle ROM Seated: Motion: AROM seated Right Left  Plantarflexion 4+ 3  Dorsiflexion 4+ 4  Inversion 4 3+  Eversion 4+ 3+    SHOULDER SPECIAL TESTS: Impingement tests:  unable to test due to pain in starting position SLAP lesions:  unable to test due to pain in starting position Instability tests:  unable to test due to pain in starting position Rotator cuff assessment:  unable to test due to pain in starting position Biceps assessment:  unable to test due to pain in starting position   LOW BACK SPECIAL TEST: SLR: negative  Scour test: negative FABER: negative Pelvic compression/distraction: negative  Slump test positive LLE  6 min walk test: 1160 ft increased left foot drag and antalgic gait pattern with foot eversion    JOINT MOBILITY TESTING:  AP: painful PA: painful Inferior: painful Distraction: reduces pain   Back mobilization hypomobile and painful to grade II mobilizations    PALPATION:  Tenderness to all joint regions of GH     TODAY'S TREATMENT: DATE: 03/06/24    Manual:  Prone hip flexor lengthening 30 seconds each LE  Grade II inferior SAD 3x30 Grade II  inferior SAD with figure four position 3x30 seconds   TherAct Supine: -bring leg on top of other leg into figure four position 10x;  -heel slide 10x;  -thoracic rotation 10x  -nerve glide 25x each LE -posterior pelvic tilt with adduction 10x  -BTB abduction 15x  -BTB march 15x each LE   Seated: -LAQ 10x; 5 second holds -march with arms crossed 15x   BAPS board LLE; -df/pf 15x -eversion/inversion 15x  -circle clockwise 10x , counterclockwise 10x    Trigger Point Dry Needling  Subsequent Treatment: Instructions provided previously at initial dry needling treatment.   Patient Verbal Consent Given: Yes Education Handout Provided: Previously Provided Muscles Treated: bilateral glutes and piriformis  Electrical Stimulation Performed: No Treatment Response/Outcome: tolerated well    PATIENT EDUCATION: Education details: goals, POC  Person educated: Patient Education method: Explanation, Demonstration, Tactile cues, Verbal cues, and Handouts Education comprehension: verbalized understanding, returned demonstration, verbal cues required, and tactile cues required  HOME EXERCISE PROGRAM:  Access Code: [email or web address] URL: [email or web address] Date: 10/03/2023 Prepared by: Precious Bard  Exercises - Seated Scapular Retraction  - 1 x daily - 7 x weekly - 2 sets - 10 reps -  5 hold - Neck Sidebending  - 1 x daily - 7 x weekly - 2 sets - 2 reps - 30 hold - Supine Isometric Shoulder Extension with Towel  - 1 x daily - 7 x weekly - 2 sets - 10 reps - 5 hold - Supine Isometric Shoulder Adduction with Towel Roll  - 1 x daily - 7 x weekly - 2 sets - 10 reps - 5 hold - Seated Bilateral Shoulder Flexion Towel Slide at Table Top  - 1 x daily - 7 x weekly - 1 sets - 3 reps - 30 hold - Supine Posterior Pelvic Tilt  - 1 x daily - 7 x weekly - 2 sets - 5 reps - 30 hold - Supine LE Neural Mobilization  - 1 x daily - 7 x weekly - 2 sets - 10 reps - 5 hold - Seated Ankle Alphabet  - 1  x daily - 7 x weekly - 2 sets - 1 reps - 5 hold  ASSESSMENT:  CLINICAL IMPRESSION:   Patient is challenged with LLE strength, she tolerates all interventions well but is fatigued. She is highly motivated throughout session. L Ankle/foot control is improving but continues to be challenged by [employer]. Pt will continue to benefit from skilled therapy to address remaining deficits in order to improve overall QoL and return to PLOF.      OBJECTIVE IMPAIRMENTS: cardiopulmonary status limiting activity, decreased activity tolerance, decreased mobility, increased fascial restrictions, impaired perceived functional ability, increased muscle spasms, impaired flexibility, impaired UE functional use, improper body mechanics, postural dysfunction, and pain.   ACTIVITY LIMITATIONS: carrying, lifting, bed mobility, bathing, dressing, self feeding, reach over head, and caring for others  PARTICIPATION LIMITATIONS: meal prep, cleaning, laundry, personal finances, interpersonal relationship, driving, shopping, community activity, yard work, church, and caregiving for husband  PERSONAL FACTORS: Age, Fitness, Past/current experiences, Time since onset of injury/illness/exacerbation, and 3+ comorbidities: aorta type A dissection in sept 2011 with aortic valve replacement bypass of R coronary artery, AAA, anemia, anxiety, CAD, CHF, depression, GERD, Heart failure, HLD, HTN, IBS, Kidney problems, migraine, osteoarthritis, osteoporosis, RA, ocular migraines, back pain  are also affecting patient's functional outcome.   REHAB POTENTIAL: Fair    CLINICAL DECISION MAKING: Evolving/moderate complexity  EVALUATION COMPLEXITY: Moderate   GOALS: Goals reviewed with patient? Yes  SHORT TERM GOALS: Target date: 12/27/2023  Patient will be independent in home exercise program to improve strength/mobility for better functional independence with ADLs. Baseline:7/23: HEP given 9/16: compliant 1x/week  Goal status: On  going     LONG TERM GOALS: Target date: 03/22/2024   Patient will increase FOTO score to equal to or greater than  57%   to demonstrate statistically significant improvement in mobility and quality of life.  Baseline: 7/22: 46 9/16: 50% 10/7: 40% 12/10: 57% Goal status: MET  2.  Patient will improve shoulder AROM to > 140 degrees of flexion, scaption, and abduction for improved ability to perform overhead activities. Baseline: 7/22: see above 9/16: see above 10/7 : defer to exacerbation 12/10: shoulder abduction >140, flexion < 140 , assess scaption next visit 2/6: flexion >140, abduction >140 scaption >140 Goal status: MET  3.  Patient will demonstrate adequate shoulder ROM and strength to be able to shave and dress independently with pain less than 3/10. Baseline: 7/23: 6/10 pain with dressing, limited ROM, unable to test strength 9/16: 3/10 pain with dressing  Goal status: MET  4.  Patient will decrease Quick DASH score by >  8 points demonstrating reduced self-reported upper extremity disability for L and R shoulder  Baseline: 7/23: L shoulder 50% 9/16: R=54.54 % L 66% L 81% R 24% 12/10: R: 43.2% L: 56.8% 2/6: L 22%  R 34%  Goal status: MET  5.  Patient will be able to eat an entire meal without resting arm demonstrating improved functional strength Baseline: 7/23: has to rest while eating due to pain. 9/16: able to make halfway through meal 10/7: can make it about halfway through meal, some meals can get 3/4s through meal 12/10: improved, still has to rest every once in a while 2/6: can get halfway to 2/3rds of the meal.  Goal status: Partially Met  6.  Patient will be able to stand 15 minutes without her LLE going numb allowing for ADLS such as showering and iADLs such as cooking Baseline: 5 minutes 12/10: will assess next visit 2/6: 10 minutes  Goal status: IN PROGRESS  7. Patient will increase six minute walk test distance to >1500 for progression to community ambulator and  improve gait ability Baseline: 1160 ft  12/10: 1010 ft 2/6: 1140 ft  Goal status: IN PROGRESS  8  Patient will reduce modified Oswestry score to <20 as to demonstrate minimal disability with ADLs including improved sleeping tolerance, walking/sitting tolerance etc for better mobility with ADLs.  Baseline: 2/6: 60% Goal status: New PLAN:  PT FREQUENCY: 2x/week  PT DURATION: 8 weeks  PLANNED INTERVENTIONS: Therapeutic exercises, Therapeutic activity, Neuromuscular re-education, Balance training, Gait training, Patient/Family education, Self Care, Joint mobilization, Vestibular training, Canalith repositioning, Visual/preceptual remediation/compensation, Dry Needling, Electrical stimulation, Spinal mobilization, Cryotherapy, Moist heat, Compression bandaging, scar mobilization, Splintting, Taping, Traction, Ultrasound, Biofeedback, Ionotophoresis 4mg /ml Dexamethasone, Manual therapy, and Re-evaluation  PLAN FOR NEXT SESSION:   Continue with LUE strengthening  L ankle strengthening, distraction, core activation, reassess scaption and flexion ROM, make HEP     Precious Bard PT  Physical Therapist - [facility]  [facility]  03/06/24, 2:00 PM

## 2024-03-06 ENCOUNTER — Ambulatory Visit: Payer: Medicare HMO

## 2024-03-06 ENCOUNTER — Ambulatory Visit: Payer: Medicare HMO | Admitting: Psychology

## 2024-03-06 DIAGNOSIS — M6281 Muscle weakness (generalized): Secondary | ICD-10-CM | POA: Diagnosis not present

## 2024-03-06 DIAGNOSIS — R293 Abnormal posture: Secondary | ICD-10-CM | POA: Diagnosis not present

## 2024-03-06 DIAGNOSIS — F331 Major depressive disorder, recurrent, moderate: Secondary | ICD-10-CM

## 2024-03-06 DIAGNOSIS — M25511 Pain in right shoulder: Secondary | ICD-10-CM | POA: Diagnosis not present

## 2024-03-06 DIAGNOSIS — M5459 Other low back pain: Secondary | ICD-10-CM

## 2024-03-06 DIAGNOSIS — R278 Other lack of coordination: Secondary | ICD-10-CM | POA: Diagnosis not present

## 2024-03-06 DIAGNOSIS — R2689 Other abnormalities of gait and mobility: Secondary | ICD-10-CM | POA: Diagnosis not present

## 2024-03-06 DIAGNOSIS — M25512 Pain in left shoulder: Secondary | ICD-10-CM | POA: Diagnosis not present

## 2024-03-06 DIAGNOSIS — M25612 Stiffness of left shoulder, not elsewhere classified: Secondary | ICD-10-CM

## 2024-03-06 DIAGNOSIS — G8929 Other chronic pain: Secondary | ICD-10-CM | POA: Diagnosis not present

## 2024-03-06 NOTE — Progress Notes (Signed)
 [facility] Counselor/Therapist Progress Note  Patient ID: CAYA SOBERANIS, MRN: [id number],    Date: 03/06/2024  Time Spent: 10:00am-10:50am  50 minutes   Treatment Type: Individual Therapy  Reported Symptoms: stress  Mental Status Exam: Appearance:  Casual     Behavior: Appropriate  Motor: Normal  Speech/Language:  Normal Rate  Affect: Appropriate  Mood: normal  Thought process: normal  Thought content:   WNL  Sensory/Perceptual disturbances:   WNL  Orientation: oriented to person, place, time/date, and situation  Attention: Good  Concentration: Good  Memory: WNL  Fund of knowledge:  Good  Insight:   Good  Judgment:  Good  Impulse Control: Good   Risk Assessment: Danger to Self:  No Self-injurious Behavior: No Danger to Others: No Duty to Warn:no Physical Aggression / Violence:No  Access to Firearms a concern: No  Gang Involvement:No   Subjective: Pt present for face-to-face individual therapy via video.  Pt consents to telehealth video session and is aware of limitations and benefits of virtual sessions.  Location of pt: home Location of therapist: home office.  Pt talked about having dental surgery yesterday.    She is in pain but overall the surgery went well.   Pt talked about caregiving for [name].   [name] has been having some behaviors that have been challenging to deal with.  He is not using his walker like he should and pt is worried about him falling.  Worked with pt on problem solving and stress management.  Pt is hoping that the house renovations will be done within a month.   Worked on self care strategies.  Provided supportive therapy.  Interventions: Cognitive Behavioral Therapy and Insight-Oriented  Diagnosis:  F33.1  Plan of Care: Recommend ongoing therapy.   Pt participated in setting treatment goals.   Pt needs a place to talk about stressors.  Plan to meet every two weeks.    Treatment Plan (target date: 03/23/2024) Client  Abilities/Strengths  Pt is bright, engaging, and motivated for therapy.   Client Treatment Preferences  Individual therapy.  Client Statement of Needs  Improve coping skills.  Have a place to talk about stressors.   Symptoms  Depressed or irritable mood. Unresolved grief issues.  Problems Addressed  Unipolar Depression Goals 1. Alleviate depressive symptoms and return to previous level of effective functioning. 2. Appropriately grieve the loss in order to normalize mood and to return to previously adaptive level of functioning. Objective Learn and implement behavioral strategies to overcome depression. Target Date: 2024-03-23 Frequency: Biweekly  Progress: 40 Modality: individual  Related Interventions Engage the client in "behavioral activation," increasing his/her activity level and contact with sources of reward, while identifying processes that inhibit activation.  Use behavioral techniques such as instruction, rehearsal, role-playing, role reversal, as needed, to facilitate activity in the client's daily life; reinforce success. Assist the client in developing skills that increase the likelihood of deriving pleasure from behavioral activation (e.g., assertiveness skills, developing an exercise plan, less internal/more external focus, increased social involvement); reinforce success. Objective Identify important people in life, past and present, and describe the quality, good and poor, of those relationships. Target Date: 2024-03-23 Frequency: Biweekly  Progress: 40 Modality: individual  Related Interventions Conduct Interpersonal Therapy beginning with the assessment of the client's "interpersonal inventory" of important past and present relationships; develop a case formulation linking depression to grief, interpersonal role disputes, role transitions, and/or interpersonal deficits). Objective Learn and implement problem-solving and decision-making skills. Target Date: 2024-03-23  Frequency: Biweekly  Progress: 40 Modality: individual  Related Interventions Conduct Problem-Solving Therapy using techniques such as psychoeducation, modeling, and role-playing to teach client problem-solving skills (i.e., defining a problem specifically, generating possible solutions, evaluating the pros and cons of each solution, selecting and implementing a plan of action, evaluating the efficacy of the plan, accepting or revising the plan); role-play application of the problem-solving skill to a real life issue. Encourage in the client the development of a positive problem orientation in which problems and solving them are viewed as a natural part of life and not something to be feared, despaired, or avoided. 3. Develop healthy interpersonal relationships that lead to the alleviation and help prevent the relapse of depression. 4. Develop healthy thinking patterns and beliefs about self, others, and the world that lead to the alleviation and help prevent the relapse of depression. 5. Recognize, accept, and cope with feelings of depression. Diagnosis F33.1  Conditions For Discharge Achievement of treatment goals and objectives   Salomon Fick, LCSW

## 2024-03-12 ENCOUNTER — Ambulatory Visit (INDEPENDENT_AMBULATORY_CARE_PROVIDER_SITE_OTHER): Payer: Medicare HMO | Admitting: Dermatology

## 2024-03-12 ENCOUNTER — Encounter: Payer: Self-pay | Admitting: Dermatology

## 2024-03-12 DIAGNOSIS — L814 Other melanin hyperpigmentation: Secondary | ICD-10-CM

## 2024-03-12 DIAGNOSIS — D1801 Hemangioma of skin and subcutaneous tissue: Secondary | ICD-10-CM

## 2024-03-12 DIAGNOSIS — D229 Melanocytic nevi, unspecified: Secondary | ICD-10-CM

## 2024-03-12 DIAGNOSIS — Z1283 Encounter for screening for malignant neoplasm of skin: Secondary | ICD-10-CM

## 2024-03-12 DIAGNOSIS — W098XXA Fall on or from other playground equipment, initial encounter: Secondary | ICD-10-CM | POA: Diagnosis not present

## 2024-03-12 DIAGNOSIS — L821 Other seborrheic keratosis: Secondary | ICD-10-CM

## 2024-03-12 DIAGNOSIS — L820 Inflamed seborrheic keratosis: Secondary | ICD-10-CM | POA: Diagnosis not present

## 2024-03-12 DIAGNOSIS — L578 Other skin changes due to chronic exposure to nonionizing radiation: Secondary | ICD-10-CM | POA: Diagnosis not present

## 2024-03-12 DIAGNOSIS — R61 Generalized hyperhidrosis: Secondary | ICD-10-CM | POA: Diagnosis not present

## 2024-03-12 DIAGNOSIS — L304 Erythema intertrigo: Secondary | ICD-10-CM | POA: Diagnosis not present

## 2024-03-12 MED ORDER — DRYSOL 20 % EX SOLN: CUTANEOUS | 11 refills | Status: DC

## 2024-03-12 NOTE — Patient Instructions (Addendum)

## 2024-03-12 NOTE — Therapy (Signed)
 OUTPATIENT PHYSICAL THERAPY TREATMENT      Patient Name: Rachel Torres MRN: [id number] DOB:Sep 30, 1956, 68 y.o., female Today's Date: 03/13/2024  END OF SESSION:  PT End of Session - 03/13/24 1312     Visit Number 25    Number of Visits 35    Date for PT Re-Evaluation 03/22/24    Authorization Type [employer]  Medicare HMO    Authorization Time Period 09/05/23-10/31/23    Progress Note Due on Visit 10    PT Start Time 1315    PT Stop Time 1359    PT Time Calculation (min) 44 min    Equipment Utilized During Treatment Gait belt    Activity Tolerance Patient tolerated treatment well    Behavior During Therapy WFL for tasks assessed/performed                                Past Medical History:  Diagnosis Date   AAA (abdominal aortic aneurysm) (HCC)    a. Chronic w/o evidence of aneurysmal dil on CTA 01/2016.   Alcohol abuse    Allergy    Anemia    Anxiety    Asthma    CAD (coronary artery disease)    a. 08/2010 s/p CABG x 1 (VG->RCA) @ time of Ao dissection repair; b. 01/2016 Lexiscan MV: mid antsept/apical defect w/ ? peri-infarct ischemia-->likely attenuation-->Med Rx.   Chewing difficulty    Chronic combined systolic (congestive) and diastolic (congestive) heart failure (HCC)    a. 2012 EF 30-35%; b. 04/2015 EF 40-45%; c. 01/2016 Echo: Ef 55-65%, nl AoV bioprosthesis, nl RV, nl PASP.   Constipation    Depression    Edema of both lower extremities    Gallbladder sludge    GERD (gastroesophageal reflux disease)    Heart failure (HCC)    Heart valve problem    History of Bicuspid Aortic Valve    a. 08/2010 s/p AVR @ time of Ao dissection repair; b. 01/2016 Echo: Ef 55-65%, nl AoV bioprosthesis, nl RV, nl PASP.   Hx of repair of dissecting thoracic aortic aneurysm, Stanford type A    a. 08/2010 s/p repair with AVR and VG->RCA; b. 01/2016 CTA: stable appearance of Asc Thoracic Aortic graft. Opacification of flase lumen of chronic abd Ao dissection w/  retrograde flow through lumbar arteries. No aneurysmal dil of flase lumen.   Hyperlipidemia    Hypertension    Hypertensive heart disease    IBS (irritable bowel syndrome)    Internal hemorrhoids    Kidney problem    Marfan syndrome    pt denies   Migraine    Obstructive sleep apnea    Osteoarthritis    Osteoporosis    Palpitations    Pneumonia    RA (rheumatoid arthritis) (HCC)    a. Followed by Dr. Mallie Mussel   Past Surgical History:  Procedure Laterality Date   ABDOMINAL AORTIC ANEURYSM REPAIR     CARDIAC CATHETERIZATION  2001   CESAREAN SECTION  1986 & 1991   CHOLECYSTECTOMY     COLONOSCOPY     COLONOSCOPY WITH PROPOFOL N/A 01/19/2024   Procedure: COLONOSCOPY WITH PROPOFOL;  Surgeon: Wyline Mood, MD;  Location: [facility] ENDOSCOPY;  Service: Gastroenterology;  Laterality: N/A;   ESOPHAGOGASTRODUODENOSCOPY (EGD) WITH PROPOFOL N/A 05/08/2020   Procedure: ESOPHAGOGASTRODUODENOSCOPY (EGD) WITH PROPOFOL;  Surgeon: Pasty Spillers, MD;  Location: [facility] ENDOSCOPY;  Service: Endoscopy;  Laterality: N/A;   FRACTURE SURGERY Right  shoulder replacement   HEMORRHOID BANDING     ORIF DISTAL RADIUS FRACTURE Left    POLYPECTOMY  01/19/2024   Procedure: POLYPECTOMY;  Surgeon: Wyline Mood, MD;  Location: [facility] ENDOSCOPY;  Service: Gastroenterology;;   UPPER GASTROINTESTINAL ENDOSCOPY     Patient Active Problem List   Diagnosis Date Noted   Adenomatous polyp of colon 01/19/2024   Heart failure with improved ejection fraction (HFimpEF) (HCC) 01/18/2024   Abnormal metabolism 11/14/2023   Weight regain after inital weight loss 11/14/2023   Multiple closed tarsal fractures of left foot, sequela 07/12/2023   Arm contusion, right, sequela 07/12/2023   Lumbar disc disease 07/12/2023   PAF (paroxysmal atrial fibrillation) (HCC) 06/09/2023   Paresthesia 06/03/2023   Obesity, Beginning BMI 35.45 02/17/2023   Tinnitus 01/14/2023   Change in vision 01/14/2023   Encounter for screening colonoscopy  01/14/2023   Personal history of fall 01/14/2023   Problem related to unspecified psychosocial circumstances 12/28/2022   Person encountering health services to consult on behalf of another person 12/28/2022   Palpitation 12/10/2022   Fatigue 10/26/2022   B12 deficiency 10/26/2022   Right shoulder pain 09/15/2022   Depression 09/14/2022   Other constipation 05/12/2022   Medicare annual wellness visit, initial 07/17/2021   TMJ (dislocation of temporomandibular joint) 07/17/2021   Encounter for screening mammogram for breast cancer 06/05/2021   Diarrhea 04/11/2020   Epigastric pain 02/19/2020   Dark stools 02/19/2020   History of atrial fibrillation 02/05/2019   Shortness of breath 01/27/2019   Irregular surface of cornea 12/25/2018   Acute cough 09/08/2017   History of HPV infection 12/01/2016   Screening mammogram, encounter for 11/29/2016   Estrogen deficiency 11/29/2016   Encounter for routine gynecological examination 11/29/2016   Grade III hemorrhoids 10/06/2016   Tachycardia 08/14/2016   Hemorrhoids 08/13/2016   Atypical migraine 08/03/2016   CAD (coronary artery disease)    AAA (abdominal aortic aneurysm) (HCC)    Chronic combined systolic (congestive) and diastolic (congestive) heart failure (HCC)    Prediabetes 02/11/2016   Generalized anxiety disorder 12/08/2015   Panic attacks 07/28/2015   Chest pain 07/09/2013   Sensorineural hearing loss 03/27/2013   Vitamin D deficiency 07/03/2012   H/O aortic dissection 05/12/2012   Routine general medical examination at a health care facility 09/20/2011   Atypical chest pain 09/15/2011   S/P AVR (aortic valve replacement) 01/07/2011   CORONARY ARTERY BYPASS GRAFT, HX OF 01/07/2011   Arthritis 12/20/2010   Back pain 12/15/2010   H/O dissecting abdominal aortic aneurysm repair 08/24/2010   IRRITABLE BOWEL SYNDROME 09/09/2009   Obstructive sleep apnea 08/04/2009   External hemorrhoid 06/26/2009   Osteoporosis 01/09/2009    Hyperlipidemia 07/26/2007   Depression with anxiety 07/26/2007   Essential hypertension 07/26/2007   Allergic rhinitis 07/26/2007   Asthma 07/26/2007   GERD 07/26/2007   Osteoarthritis 07/26/2007   Ocular migraine 07/26/2007   Mild intermittent asthma without complication 07/26/2007    PCP: [name] MD   REFERRING PROVIDER: Stormy Card  REFERRING DIAG: L rotator cuff   THERAPY DIAG:  Stiffness of left shoulder, not elsewhere classified  Other abnormalities of gait and mobility  Abnormal posture  Other low back pain  Rationale for Evaluation and Treatment: Rehabilitation  ONSET DATE: Feb 2024  SUBJECTIVE:  SUBJECTIVE STATEMENT: Patient presents with husband. Was standing in line for food and felt L leg go numb.   Hand dominance: Left  PERTINENT HISTORY: Kaidynce Pfister is a 66yoF familiar to our services. Pt referred to PT for strengthening of chronic left shoulder DJD, known remote RC injury. Recently fell and broke four metatarsals, since cleared from use of CAM rocker. She was treated successfully in past for right shoulder pain and has past medical history of Right shoulder surgery replacement. 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 PMH aorta type A dissection in sept 2011 s/p aortic valve replacement, bypass of R coronary artery, anemia, anxiety, CAD, CHF, depression, GERD, Heart failure, HLD, HTN, IBS, migraine, osteoarthritis, osteoporosis, RA, back pain.    Back pain:  Patient has new referral for radiculopathy, lumbar region. MRI showed degeneration of L1-2 and L2-3 with some disc space collapse and minor retrolisthesis of L2-3. Issue began in February, felt numbness in L thigh. No cause known, but she was at a museum and noticed it was numb. Sometimes L foot numbness and  toe numbness of 2nd and 3rd toe causing tripping. This caused her to trip July 4th and break five metatarsals. Some minor pain in the low pain noted. Pain noted to be 2-3/10 Pain in hip 3-4/10 of L hip.    PAIN:  Are you having pain? Yes, 3/10 pain in lateral deltoids in the shoulder.  PRECAUTIONS: None  FALLS:  Has patient fallen in last 6 months? Yes. Number of falls 2  PATIENT GOALS:to lift more than 3lb with LUE, be able to eat a meal without arm being tired.    OBJECTIVE:  DIAGNOSTIC FINDINGS:  IMPRESSION: Mild degenerative change in the cervical spine without acute fracture or traumatic subluxation.   IMPRESSION: Full width, full width tears of the supraspinatus and infraspinatus tendons with retraction to the glenoid. Grade 2/3 infraspinatus atrophy.   Moderate tendinosis of the distal subscapularis tendon with articular sided fraying of the cephalad fibers.   Moderate intra-articular long head biceps tendinosis with fraying and possible partial tearing.   Mild glenohumeral and moderate AC joint osteoarthritis.   Degenerative superior labral tearing anteriorly and posteriorly.   PATIENT SURVEYS:  Quick Dash 50%  and FOTO 46%   COGNITION: Overall cognitive status: Within functional limits for tasks assessed                                  SENSATION: WFL   POSTURE: Slight rounded shoulders and shoulder elevation    UPPER EXTREMITY ROM:    Active ROM Right eval 9/16: recert RUE Left eval 9/16 RECERT LUE  Shoulder flexion 64 122 91 92  Shoulder extension        Shoulder abduction 95 88 52 82  Shoulder adduction        Shoulder internal rotation Unable to test at 90 61 degrees 5 40  Shoulder external rotation 18at side of body -5 degrees at 90 degrees 34 35  Elbow flexion [facility]  WFL   Elbow extension [facility]  WFL   Wrist flexion        Wrist extension        Wrist ulnar deviation        Wrist radial deviation        Wrist pronation        Wrist  supination        (Blank rows = not tested)  UPPER EXTREMITY MMT:   MMT Right eval Left eval  Shoulder flexion      Shoulder extension      Shoulder abduction      Shoulder adduction      Shoulder internal rotation      Shoulder external rotation      Middle trapezius      Lower trapezius      Elbow flexion      Elbow extension      Wrist flexion      Wrist extension      Wrist ulnar deviation      Wrist radial deviation      Wrist pronation      Wrist supination      Grip strength (lbs) 30 35  (Blank rows = not tested) Unable to test strength due to pain with motion   TRUNK ROM: Trunk Flexion 65  Trunk Extension 7  Trunk R SB Limited 25%  Trunk L SB WFL  Trunk R rotation WFL  Trunk L rotation Limited 25%    Ankle ROM Seated: Motion: AROM seated Right Left  Plantarflexion 4+ 3  Dorsiflexion 4+ 4  Inversion 4 3+  Eversion 4+ 3+    SHOULDER SPECIAL TESTS: Impingement tests:  unable to test due to pain in starting position SLAP lesions:  unable to test due to pain in starting position Instability tests:  unable to test due to pain in starting position Rotator cuff assessment:  unable to test due to pain in starting position Biceps assessment:  unable to test due to pain in starting position   LOW BACK SPECIAL TEST: SLR: negative  Scour test: negative FABER: negative Pelvic compression/distraction: negative  Slump test positive LLE  6 min walk test: 1160 ft increased left foot drag and antalgic gait pattern with foot eversion    JOINT MOBILITY TESTING:  AP: painful PA: painful Inferior: painful Distraction: reduces pain   Back mobilization hypomobile and painful to grade II mobilizations    PALPATION:  Tenderness to all joint regions of GH     TODAY'S TREATMENT: DATE: 03/13/24    Manual:  Prone hip flexor lengthening 30 seconds each LE  Grade II mobilizations thoracic spine x multiple reps .  Grade II inferior SAD 3x30 Grade II inferior  SAD with figure four position 3x30 seconds   TherAct Supine: -bring leg on top of other leg into figure four position 10x;  -nerve glide 25x each LE -posterior pelvic tilt with adduction 10x  -BTB abduction 15x  -BTB march 15x each LE   -sidelying open book 10x     BAPS board LLE; -df/pf 15x -eversion/inversion 15x  -circle clockwise 10x , counterclockwise 10x    Trigger Point Dry Needling  Subsequent Treatment: Instructions provided previously at initial dry needling treatment.   Patient Verbal Consent Given: Yes Education Handout Provided: Previously Provided Muscles Treated: bilateral lumbar paraspinals  Electrical Stimulation Performed: No Treatment Response/Outcome: tolerated well    PATIENT EDUCATION: Education details: goals, POC  Person educated: Patient Education method: Explanation, Demonstration, Tactile cues, Verbal cues, and Handouts Education comprehension: verbalized understanding, returned demonstration, verbal cues required, and tactile cues required  HOME EXERCISE PROGRAM:  Access Code: [email or web address] URL: [email or web address] Date: 10/03/2023 Prepared by: Precious Bard  Exercises - Seated Scapular Retraction  - 1 x daily - 7 x weekly - 2 sets - 10 reps - 5 hold - Neck Sidebending  - 1 x daily - 7 x weekly - 2 sets - 2  reps - 30 hold - Supine Isometric Shoulder Extension with Towel  - 1 x daily - 7 x weekly - 2 sets - 10 reps - 5 hold - Supine Isometric Shoulder Adduction with Towel Roll  - 1 x daily - 7 x weekly - 2 sets - 10 reps - 5 hold - Seated Bilateral Shoulder Flexion Towel Slide at Table Top  - 1 x daily - 7 x weekly - 1 sets - 3 reps - 30 hold - Supine Posterior Pelvic Tilt  - 1 x daily - 7 x weekly - 2 sets - 5 reps - 30 hold - Supine LE Neural Mobilization  - 1 x daily - 7 x weekly - 2 sets - 10 reps - 5 hold - Seated Ankle Alphabet  - 1 x daily - 7 x weekly - 2 sets - 1 reps - 5 hold  ASSESSMENT:  CLINICAL IMPRESSION:   Patient presents with excellent motivation. She has decreased pain s/p inferior distraction with belt in figure four position. Thoracic mobility reduced LE symptoms.  Patient continues to have L foot drag that causes intermittent instability. Pt will continue to benefit from skilled therapy to address remaining deficits in order to improve overall QoL and return to PLOF.      OBJECTIVE IMPAIRMENTS: cardiopulmonary status limiting activity, decreased activity tolerance, decreased mobility, increased fascial restrictions, impaired perceived functional ability, increased muscle spasms, impaired flexibility, impaired UE functional use, improper body mechanics, postural dysfunction, and pain.   ACTIVITY LIMITATIONS: carrying, lifting, bed mobility, bathing, dressing, self feeding, reach over head, and caring for others  PARTICIPATION LIMITATIONS: meal prep, cleaning, laundry, personal finances, interpersonal relationship, driving, shopping, community activity, yard work, church, and caregiving for husband  PERSONAL FACTORS: Age, Fitness, Past/current experiences, Time since onset of injury/illness/exacerbation, and 3+ comorbidities: aorta type A dissection in sept 2011 with aortic valve replacement bypass of R coronary artery, AAA, anemia, anxiety, CAD, CHF, depression, GERD, Heart failure, HLD, HTN, IBS, Kidney problems, migraine, osteoarthritis, osteoporosis, RA, ocular migraines, back pain  are also affecting patient's functional outcome.   REHAB POTENTIAL: Fair    CLINICAL DECISION MAKING: Evolving/moderate complexity  EVALUATION COMPLEXITY: Moderate   GOALS: Goals reviewed with patient? Yes  SHORT TERM GOALS: Target date: 12/27/2023  Patient will be independent in home exercise program to improve strength/mobility for better functional independence with ADLs. Baseline:7/23: HEP given 9/16: compliant 1x/week  Goal status: On going     LONG TERM GOALS: Target date: 03/22/2024   Patient  will increase FOTO score to equal to or greater than  57%   to demonstrate statistically significant improvement in mobility and quality of life.  Baseline: 7/22: 46 9/16: 50% 10/7: 40% 12/10: 57% Goal status: MET  2.  Patient will improve shoulder AROM to > 140 degrees of flexion, scaption, and abduction for improved ability to perform overhead activities. Baseline: 7/22: see above 9/16: see above 10/7 : defer to exacerbation 12/10: shoulder abduction >140, flexion < 140 , assess scaption next visit 2/6: flexion >140, abduction >140 scaption >140 Goal status: MET  3.  Patient will demonstrate adequate shoulder ROM and strength to be able to shave and dress independently with pain less than 3/10. Baseline: 7/23: 6/10 pain with dressing, limited ROM, unable to test strength 9/16: 3/10 pain with dressing  Goal status: MET  4.  Patient will decrease Quick DASH score by > 8 points demonstrating reduced self-reported upper extremity disability for L and R shoulder  Baseline: 7/23: L shoulder  50% 9/16: R=54.54 % L 66% L 81% R 24% 12/10: R: 43.2% L: 56.8% 2/6: L 22%  R 34%  Goal status: MET  5.  Patient will be able to eat an entire meal without resting arm demonstrating improved functional strength Baseline: 7/23: has to rest while eating due to pain. 9/16: able to make halfway through meal 10/7: can make it about halfway through meal, some meals can get 3/4s through meal 12/10: improved, still has to rest every once in a while 2/6: can get halfway to 2/3rds of the meal.  Goal status: Partially Met  6.  Patient will be able to stand 15 minutes without her LLE going numb allowing for ADLS such as showering and iADLs such as cooking Baseline: 5 minutes 12/10: will assess next visit 2/6: 10 minutes  Goal status: IN PROGRESS  7. Patient will increase six minute walk test distance to >1500 for progression to community ambulator and improve gait ability Baseline: 1160 ft  12/10: 1010 ft 2/6: 1140 ft   Goal status: IN PROGRESS  8  Patient will reduce modified Oswestry score to <20 as to demonstrate minimal disability with ADLs including improved sleeping tolerance, walking/sitting tolerance etc for better mobility with ADLs.  Baseline: 2/6: 60% Goal status: New PLAN:  PT FREQUENCY: 2x/week  PT DURATION: 8 weeks  PLANNED INTERVENTIONS: Therapeutic exercises, Therapeutic activity, Neuromuscular re-education, Balance training, Gait training, Patient/Family education, Self Care, Joint mobilization, Vestibular training, Canalith repositioning, Visual/preceptual remediation/compensation, Dry Needling, Electrical stimulation, Spinal mobilization, Cryotherapy, Moist heat, Compression bandaging, scar mobilization, Splintting, Taping, Traction, Ultrasound, Biofeedback, Ionotophoresis 4mg /ml Dexamethasone, Manual therapy, and Re-evaluation  PLAN FOR NEXT SESSION:   Continue with LUE strengthening  L ankle strengthening, distraction, core activation, reassess scaption and flexion ROM, make HEP     Precious Bard PT  Physical Therapist - [facility]  [facility]  03/13/24, 3:08 PM

## 2024-03-12 NOTE — Progress Notes (Signed)
 Follow-Up Visit   Subjective  Rachel Torres is a 68 y.o. female who presents for the following: Skin Cancer Screening and Full Body Skin Exam. Places at bil popliteal ~1 year, get itchy & irritated at night. No personal hx of skin cancers. Sweating at neck for about 10 years. Patient uses monistat cream prn for intertrigo.  The patient presents for Total-Body Skin Exam (TBSE) for skin cancer screening and mole check. The patient has spots, moles and lesions to be evaluated, some may be new or changing and the patient may have concern these could be cancer.  The following portions of the chart were reviewed this encounter and updated as appropriate: medications, allergies, medical history  Review of Systems:  No other skin or systemic complaints except as noted in HPI or Assessment and Plan.  Objective  Well appearing patient in no apparent distress; mood and affect are within normal limits.  A full examination was performed including scalp, head, eyes, ears, nose, lips, neck, chest, axillae, abdomen, back, buttocks, bilateral upper extremities, bilateral lower extremities, hands, feet, fingers, toes, fingernails, and toenails. All findings within normal limits unless otherwise noted below.   Relevant physical exam findings are noted in the Assessment and Plan.  R popliteal x1 Stuck on waxy paps with erythema neck and posterior scalp No sweating on exam Left Popliteal Fossa Stuck-on, waxy, tan-brown papules and plaques -- Discussed benign etiology and prognosis.  Assessment & Plan   SKIN CANCER SCREENING PERFORMED TODAY.  ACTINIC DAMAGE - Chronic condition, secondary to cumulative UV/sun exposure - diffuse scaly erythematous macules with underlying dyspigmentation - Recommend daily broad spectrum sunscreen SPF 30+ to sun-exposed areas, reapply every 2 hours as needed.  - Staying in the shade or wearing long sleeves, sun glasses (UVA+UVB protection) and wide brim hats (4-inch  brim around the entire circumference of the hat) are also recommended for sun protection.  - Call for new or changing lesions.  LENTIGINES, SEBORRHEIC KERATOSES, HEMANGIOMAS - Benign normal skin lesions - Benign-appearing - Call for any changes  MELANOCYTIC NEVI - Tan-brown and/or pink-flesh-colored symmetric macules and papules - Benign appearing on exam today - Observation - Call clinic for new or changing moles - Recommend daily use of broad spectrum spf 30+ sunscreen to sun-exposed areas.   Intertrigo Abdomen fold Exam: deferred   Intertrigo is a chronic recurrent rash that occurs in skin fold areas that may be associated with friction; heat; moisture; yeast; fungus; and bacteria.  It is exacerbated by increased movement / activity; sweating; and higher atmospheric temperature.    Treatment plan: Continue using Monistat cream bid prn.    INFLAMED SEBORRHEIC KERATOSIS R popliteal x1 Symptomatic, irritating, patient would like treated.  Destruction of lesion - R popliteal x1 Complexity: simple   Destruction method: cryotherapy   Informed consent: discussed and consent obtained   Timeout:  patient name, date of birth, surgical site, and procedure verified Lesion destroyed using liquid nitrogen: Yes   Region frozen until ice ball extended beyond lesion: Yes   Cryo cycles: 1 or 2. Outcome: patient tolerated procedure well with no complications   Post-procedure details: wound care instructions given   HYPERHIDROSIS neck and posterior scalp Chronic flaring not at patient goal  Failed carpe, glycopyrrolate dryness would exacerbate known xerostomia/xerophthalmia, botulinum injections not indicated for location  Discussed topicals or oral Glycopyrrolate, side effects including dry mouth, dry eyes, patient defers as to already has dry mouth. Jointly decided on trial of aluminum chloride application daily Related  Medications aluminum chloride (DRYSOL) 20 % external  solution Apply to affected areas once daily at bedtime; once excessive sweating has stopped, may decrease to once or twice weekly, or as needed. Wash treated area in the morning. SEBORRHEIC KERATOSIS Left Popliteal Fossa Discussed treatment with LN2 if becomes bothersome or irritating for patient.  INTERTRIGO   MULTIPLE BENIGN NEVI   LENTIGINES   SEBORRHEIC KERATOSES   CHERRY ANGIOMA   ACTINIC ELASTOSIS   Return in about 1 year (around 03/12/2025) for TBSE.  I, Soundra Pilon, CMA, am acting as scribe for Elie Goody, MD .   Documentation: I have reviewed the above documentation for accuracy and completeness, and I agree with the above.  Elie Goody, MD

## 2024-03-13 ENCOUNTER — Ambulatory Visit (INDEPENDENT_AMBULATORY_CARE_PROVIDER_SITE_OTHER): Payer: Medicare HMO | Admitting: Psychology

## 2024-03-13 ENCOUNTER — Ambulatory Visit: Payer: Medicare HMO

## 2024-03-13 DIAGNOSIS — M25511 Pain in right shoulder: Secondary | ICD-10-CM | POA: Diagnosis not present

## 2024-03-13 DIAGNOSIS — R293 Abnormal posture: Secondary | ICD-10-CM | POA: Diagnosis not present

## 2024-03-13 DIAGNOSIS — M25612 Stiffness of left shoulder, not elsewhere classified: Secondary | ICD-10-CM

## 2024-03-13 DIAGNOSIS — F331 Major depressive disorder, recurrent, moderate: Secondary | ICD-10-CM

## 2024-03-13 DIAGNOSIS — M5459 Other low back pain: Secondary | ICD-10-CM

## 2024-03-13 DIAGNOSIS — G8929 Other chronic pain: Secondary | ICD-10-CM | POA: Diagnosis not present

## 2024-03-13 DIAGNOSIS — R2689 Other abnormalities of gait and mobility: Secondary | ICD-10-CM | POA: Diagnosis not present

## 2024-03-13 DIAGNOSIS — R278 Other lack of coordination: Secondary | ICD-10-CM | POA: Diagnosis not present

## 2024-03-13 DIAGNOSIS — M6281 Muscle weakness (generalized): Secondary | ICD-10-CM | POA: Diagnosis not present

## 2024-03-13 DIAGNOSIS — M25512 Pain in left shoulder: Secondary | ICD-10-CM | POA: Diagnosis not present

## 2024-03-13 NOTE — Progress Notes (Signed)
 [facility] Counselor/Therapist Progress Note  Patient ID: AZORA BONZO, MRN: [id number],    Date: 03/13/2024  Time Spent: 10:00am-10:55am  55 minutes   Treatment Type: Individual Therapy  Reported Symptoms: stress  Mental Status Exam: Appearance:  Casual     Behavior: Appropriate  Motor: Normal  Speech/Language:  Normal Rate  Affect: Appropriate  Mood: normal  Thought process: normal  Thought content:   WNL  Sensory/Perceptual disturbances:   WNL  Orientation: oriented to person, place, time/date, and situation  Attention: Good  Concentration: Good  Memory: WNL  Fund of knowledge:  Good  Insight:   Good  Judgment:  Good  Impulse Control: Good   Risk Assessment: Danger to Self:  No Self-injurious Behavior: No Danger to Others: No Duty to Warn:no Physical Aggression / Violence:No  Access to Firearms a concern: No  Gang Involvement:No   Subjective: Pt present for face-to-face individual therapy via video.  Pt consents to telehealth video session and is aware of limitations and benefits of virtual sessions.  Location of pt: home Location of therapist: home office.  Pt talked about her house renovations.   They are making progress and she can see the light at the end of the tunnel.    Pt had a trip to see her son, daughter in law and grandson.   It was a short visit but she was glad to see them.   Pt talked about caregiving for [name].   [name] has been having some behaviors that have been challenging to deal with.  He is not using his walker like he should and pt is worried about him falling.  Worked with pt on problem solving and stress management.   Pt is working on being more patient with [name].    Pt talked about her weight.  She wants to lose weight but is struggling.  She is going to [employer] [facility].    Worked on self care strategies.  Provided supportive therapy.  Interventions: Cognitive Behavioral Therapy and  Insight-Oriented  Diagnosis:  F33.1  Plan of Care: Recommend ongoing therapy.   Pt participated in setting treatment goals.   Pt needs a place to talk about stressors.  Plan to meet every two weeks.    Treatment Plan (target date: 03/23/2024) Client Abilities/Strengths  Pt is bright, engaging, and motivated for therapy.   Client Treatment Preferences  Individual therapy.  Client Statement of Needs  Improve coping skills.  Have a place to talk about stressors.   Symptoms  Depressed or irritable mood. Unresolved grief issues.  Problems Addressed  Unipolar Depression Goals 1. Alleviate depressive symptoms and return to previous level of effective functioning. 2. Appropriately grieve the loss in order to normalize mood and to return to previously adaptive level of functioning. Objective Learn and implement behavioral strategies to overcome depression. Target Date: 2024-03-23 Frequency: Biweekly  Progress: 40 Modality: individual  Related Interventions Engage the client in "behavioral activation," increasing his/her activity level and contact with sources of reward, while identifying processes that inhibit activation.  Use behavioral techniques such as instruction, rehearsal, role-playing, role reversal, as needed, to facilitate activity in the client's daily life; reinforce success. Assist the client in developing skills that increase the likelihood of deriving pleasure from behavioral activation (e.g., assertiveness skills, developing an exercise plan, less internal/more external focus, increased social involvement); reinforce success. Objective Identify important people in life, past and present, and describe the quality, good and poor, of those relationships.  Target Date: 2024-03-23 Frequency: Biweekly  Progress: 40 Modality: individual  Related Interventions Conduct Interpersonal Therapy beginning with the assessment of the client's "interpersonal inventory" of important past and  present relationships; develop a case formulation linking depression to grief, interpersonal role disputes, role transitions, and/or interpersonal deficits). Objective Learn and implement problem-solving and decision-making skills. Target Date: 2024-03-23 Frequency: Biweekly  Progress: 40 Modality: individual  Related Interventions Conduct Problem-Solving Therapy using techniques such as psychoeducation, modeling, and role-playing to teach client problem-solving skills (i.e., defining a problem specifically, generating possible solutions, evaluating the pros and cons of each solution, selecting and implementing a plan of action, evaluating the efficacy of the plan, accepting or revising the plan); role-play application of the problem-solving skill to a real life issue. Encourage in the client the development of a positive problem orientation in which problems and solving them are viewed as a natural part of life and not something to be feared, despaired, or avoided. 3. Develop healthy interpersonal relationships that lead to the alleviation and help prevent the relapse of depression. 4. Develop healthy thinking patterns and beliefs about self, others, and the world that lead to the alleviation and help prevent the relapse of depression. 5. Recognize, accept, and cope with feelings of depression. Diagnosis F33.1  Conditions For Discharge Achievement of treatment goals and objectives   Salomon Fick, LCSW

## 2024-03-14 NOTE — Therapy (Incomplete)
 OUTPATIENT PHYSICAL THERAPY TREATMENT      Patient Name: Rachel Torres MRN: [id number] DOB:09-26-1956, 68 y.o., female Today's Date: 03/14/2024  END OF SESSION:                       Past Medical History:  Diagnosis Date   AAA (abdominal aortic aneurysm) (HCC)    a. Chronic w/o evidence of aneurysmal dil on CTA 01/2016.   Alcohol abuse    Allergy    Anemia    Anxiety    Asthma    CAD (coronary artery disease)    a. 08/2010 s/p CABG x 1 (VG->RCA) @ time of Ao dissection repair; b. 01/2016 Lexiscan MV: mid antsept/apical defect w/ ? peri-infarct ischemia-->likely attenuation-->Med Rx.   Chewing difficulty    Chronic combined systolic (congestive) and diastolic (congestive) heart failure (HCC)    a. 2012 EF 30-35%; b. 04/2015 EF 40-45%; c. 01/2016 Echo: Ef 55-65%, nl AoV bioprosthesis, nl RV, nl PASP.   Constipation    Depression    Edema of both lower extremities    Gallbladder sludge    GERD (gastroesophageal reflux disease)    Heart failure (HCC)    Heart valve problem    History of Bicuspid Aortic Valve    a. 08/2010 s/p AVR @ time of Ao dissection repair; b. 01/2016 Echo: Ef 55-65%, nl AoV bioprosthesis, nl RV, nl PASP.   Hx of repair of dissecting thoracic aortic aneurysm, Stanford type A    a. 08/2010 s/p repair with AVR and VG->RCA; b. 01/2016 CTA: stable appearance of Asc Thoracic Aortic graft. Opacification of flase lumen of chronic abd Ao dissection w/ retrograde flow through lumbar arteries. No aneurysmal dil of flase lumen.   Hyperlipidemia    Hypertension    Hypertensive heart disease    IBS (irritable bowel syndrome)    Internal hemorrhoids    Kidney problem    Marfan syndrome    pt denies   Migraine    Obstructive sleep apnea    Osteoarthritis    Osteoporosis    Palpitations    Pneumonia    RA (rheumatoid arthritis) (HCC)    a. Followed by Dr. Mallie Mussel   Past Surgical History:  Procedure Laterality Date   ABDOMINAL AORTIC ANEURYSM  REPAIR     CARDIAC CATHETERIZATION  2001   CESAREAN SECTION  1986 & 1991   CHOLECYSTECTOMY     COLONOSCOPY     COLONOSCOPY WITH PROPOFOL N/A 01/19/2024   Procedure: COLONOSCOPY WITH PROPOFOL;  Surgeon: Wyline Mood, MD;  Location: [facility] ENDOSCOPY;  Service: Gastroenterology;  Laterality: N/A;   ESOPHAGOGASTRODUODENOSCOPY (EGD) WITH PROPOFOL N/A 05/08/2020   Procedure: ESOPHAGOGASTRODUODENOSCOPY (EGD) WITH PROPOFOL;  Surgeon: Pasty Spillers, MD;  Location: [facility] ENDOSCOPY;  Service: Endoscopy;  Laterality: N/A;   FRACTURE SURGERY Right    shoulder replacement   HEMORRHOID BANDING     ORIF DISTAL RADIUS FRACTURE Left    POLYPECTOMY  01/19/2024   Procedure: POLYPECTOMY;  Surgeon: Wyline Mood, MD;  Location: [facility] ENDOSCOPY;  Service: Gastroenterology;;   UPPER GASTROINTESTINAL ENDOSCOPY     Patient Active Problem List   Diagnosis Date Noted   Adenomatous polyp of colon 01/19/2024   Heart failure with improved ejection fraction (HFimpEF) (HCC) 01/18/2024   Abnormal metabolism 11/14/2023   Weight regain after inital weight loss 11/14/2023   Multiple closed tarsal fractures of left foot, sequela 07/12/2023   Arm contusion, right, sequela 07/12/2023   Lumbar disc disease 07/12/2023  PAF (paroxysmal atrial fibrillation) (HCC) 06/09/2023   Paresthesia 06/03/2023   Obesity, Beginning BMI 35.45 02/17/2023   Tinnitus 01/14/2023   Change in vision 01/14/2023   Encounter for screening colonoscopy 01/14/2023   Personal history of fall 01/14/2023   Problem related to unspecified psychosocial circumstances 12/28/2022   Person encountering health services to consult on behalf of another person 12/28/2022   Palpitation 12/10/2022   Fatigue 10/26/2022   B12 deficiency 10/26/2022   Right shoulder pain 09/15/2022   Depression 09/14/2022   Other constipation 05/12/2022   Medicare annual wellness visit, initial 07/17/2021   TMJ (dislocation of temporomandibular joint) 07/17/2021   Encounter for  screening mammogram for breast cancer 06/05/2021   Diarrhea 04/11/2020   Epigastric pain 02/19/2020   Dark stools 02/19/2020   History of atrial fibrillation 02/05/2019   Shortness of breath 01/27/2019   Irregular surface of cornea 12/25/2018   Acute cough 09/08/2017   History of HPV infection 12/01/2016   Screening mammogram, encounter for 11/29/2016   Estrogen deficiency 11/29/2016   Encounter for routine gynecological examination 11/29/2016   Grade III hemorrhoids 10/06/2016   Tachycardia 08/14/2016   Hemorrhoids 08/13/2016   Atypical migraine 08/03/2016   CAD (coronary artery disease)    AAA (abdominal aortic aneurysm) (HCC)    Chronic combined systolic (congestive) and diastolic (congestive) heart failure (HCC)    Prediabetes 02/11/2016   Generalized anxiety disorder 12/08/2015   Panic attacks 07/28/2015   Chest pain 07/09/2013   Sensorineural hearing loss 03/27/2013   Vitamin D deficiency 07/03/2012   H/O aortic dissection 05/12/2012   Routine general medical examination at a health care facility 09/20/2011   Atypical chest pain 09/15/2011   S/P AVR (aortic valve replacement) 01/07/2011   CORONARY ARTERY BYPASS GRAFT, HX OF 01/07/2011   Arthritis 12/20/2010   Back pain 12/15/2010   H/O dissecting abdominal aortic aneurysm repair 08/24/2010   IRRITABLE BOWEL SYNDROME 09/09/2009   Obstructive sleep apnea 08/04/2009   External hemorrhoid 06/26/2009   Osteoporosis 01/09/2009   Hyperlipidemia 07/26/2007   Depression with anxiety 07/26/2007   Essential hypertension 07/26/2007   Allergic rhinitis 07/26/2007   Asthma 07/26/2007   GERD 07/26/2007   Osteoarthritis 07/26/2007   Ocular migraine 07/26/2007   Mild intermittent asthma without complication 07/26/2007    PCP: [name] MD   REFERRING PROVIDER: Stormy Card  REFERRING DIAG: L rotator cuff   THERAPY DIAG:  No diagnosis found.  Rationale for Evaluation and Treatment: Rehabilitation  ONSET DATE:  Feb 2024  SUBJECTIVE:  SUBJECTIVE STATEMENT: ***  Hand dominance: Left  PERTINENT HISTORY: Rachel Torres is a 66yoF familiar to our services. Pt referred to PT for strengthening of chronic left shoulder DJD, known remote RC injury. Recently fell and broke four metatarsals, since cleared from use of CAM rocker. She was treated successfully in past for right shoulder pain and has past medical history of Right shoulder surgery replacement. 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 PMH aorta type A dissection in sept 2011 s/p aortic valve replacement, bypass of R coronary artery, anemia, anxiety, CAD, CHF, depression, GERD, Heart failure, HLD, HTN, IBS, migraine, osteoarthritis, osteoporosis, RA, back pain.    Back pain:  Patient has new referral for radiculopathy, lumbar region. MRI showed degeneration of L1-2 and L2-3 with some disc space collapse and minor retrolisthesis of L2-3. Issue began in February, felt numbness in L thigh. No cause known, but she was at a museum and noticed it was numb. Sometimes L foot numbness and toe numbness of 2nd and 3rd toe causing tripping. This caused her to trip July 4th and break five metatarsals. Some minor pain in the low pain noted. Pain noted to be 2-3/10 Pain in hip 3-4/10 of L hip.    PAIN:  Are you having pain? Yes, 3/10 pain in lateral deltoids in the shoulder.  PRECAUTIONS: None  FALLS:  Has patient fallen in last 6 months? Yes. Number of falls 2  PATIENT GOALS:to lift more than 3lb with LUE, be able to eat a meal without arm being tired.    OBJECTIVE:  DIAGNOSTIC FINDINGS:  IMPRESSION: Mild degenerative change in the cervical spine without acute fracture or traumatic subluxation.   IMPRESSION: Full width, full width tears of the supraspinatus and  infraspinatus tendons with retraction to the glenoid. Grade 2/3 infraspinatus atrophy.   Moderate tendinosis of the distal subscapularis tendon with articular sided fraying of the cephalad fibers.   Moderate intra-articular long head biceps tendinosis with fraying and possible partial tearing.   Mild glenohumeral and moderate AC joint osteoarthritis.   Degenerative superior labral tearing anteriorly and posteriorly.   PATIENT SURVEYS:  Quick Dash 50%  and FOTO 46%   COGNITION: Overall cognitive status: Within functional limits for tasks assessed                                  SENSATION: WFL   POSTURE: Slight rounded shoulders and shoulder elevation    UPPER EXTREMITY ROM:    Active ROM Right eval 9/16: recert RUE Left eval 9/16 RECERT LUE  Shoulder flexion 64 122 91 92  Shoulder extension        Shoulder abduction 95 88 52 82  Shoulder adduction        Shoulder internal rotation Unable to test at 90 61 degrees 5 40  Shoulder external rotation 18at side of body -5 degrees at 90 degrees 34 35  Elbow flexion [facility]  WFL   Elbow extension [facility]  WFL   Wrist flexion        Wrist extension        Wrist ulnar deviation        Wrist radial deviation        Wrist pronation        Wrist supination        (Blank rows = not tested)   UPPER EXTREMITY MMT:   MMT Right eval Left eval  Shoulder flexion  Shoulder extension      Shoulder abduction      Shoulder adduction      Shoulder internal rotation      Shoulder external rotation      Middle trapezius      Lower trapezius      Elbow flexion      Elbow extension      Wrist flexion      Wrist extension      Wrist ulnar deviation      Wrist radial deviation      Wrist pronation      Wrist supination      Grip strength (lbs) 30 35  (Blank rows = not tested) Unable to test strength due to pain with motion   TRUNK ROM: Trunk Flexion 65  Trunk Extension 7  Trunk R SB Limited 25%  Trunk L SB WFL  Trunk R  rotation WFL  Trunk L rotation Limited 25%    Ankle ROM Seated: Motion: AROM seated Right Left  Plantarflexion 4+ 3  Dorsiflexion 4+ 4  Inversion 4 3+  Eversion 4+ 3+    SHOULDER SPECIAL TESTS: Impingement tests:  unable to test due to pain in starting position SLAP lesions:  unable to test due to pain in starting position Instability tests:  unable to test due to pain in starting position Rotator cuff assessment:  unable to test due to pain in starting position Biceps assessment:  unable to test due to pain in starting position   LOW BACK SPECIAL TEST: SLR: negative  Scour test: negative FABER: negative Pelvic compression/distraction: negative  Slump test positive LLE  6 min walk test: 1160 ft increased left foot drag and antalgic gait pattern with foot eversion    JOINT MOBILITY TESTING:  AP: painful PA: painful Inferior: painful Distraction: reduces pain   Back mobilization hypomobile and painful to grade II mobilizations    PALPATION:  Tenderness to all joint regions of GH     TODAY'S TREATMENT: DATE: 03/14/24    Manual:  Prone hip flexor lengthening 30 seconds each LE  Grade II mobilizations thoracic spine x multiple reps .  Grade II inferior SAD 3x30 Grade II inferior SAD with figure four position 3x30 seconds   TherAct Supine: -bring leg on top of other leg into figure four position 10x;  -nerve glide 25x each LE -posterior pelvic tilt with adduction 10x  -BTB abduction 15x  -BTB march 15x each LE   -sidelying open book 10x     BAPS board LLE; -df/pf 15x -eversion/inversion 15x  -circle clockwise 10x , counterclockwise 10x    Trigger Point Dry Needling  Subsequent Treatment: Instructions provided previously at initial dry needling treatment.   Patient Verbal Consent Given: Yes Education Handout Provided: Previously Provided Muscles Treated: bilateral lumbar paraspinals  Electrical Stimulation Performed: No Treatment Response/Outcome:  tolerated well    PATIENT EDUCATION: Education details: goals, POC  Person educated: Patient Education method: Explanation, Demonstration, Tactile cues, Verbal cues, and Handouts Education comprehension: verbalized understanding, returned demonstration, verbal cues required, and tactile cues required  HOME EXERCISE PROGRAM:  Access Code: [email or web address] URL: [email or web address] Date: 10/03/2023 Prepared by: Precious Bard  Exercises - Seated Scapular Retraction  - 1 x daily - 7 x weekly - 2 sets - 10 reps - 5 hold - Neck Sidebending  - 1 x daily - 7 x weekly - 2 sets - 2 reps - 30 hold - Supine Isometric Shoulder Extension with Towel  - 1 x daily -  7 x weekly - 2 sets - 10 reps - 5 hold - Supine Isometric Shoulder Adduction with Towel Roll  - 1 x daily - 7 x weekly - 2 sets - 10 reps - 5 hold - Seated Bilateral Shoulder Flexion Towel Slide at Table Top  - 1 x daily - 7 x weekly - 1 sets - 3 reps - 30 hold - Supine Posterior Pelvic Tilt  - 1 x daily - 7 x weekly - 2 sets - 5 reps - 30 hold - Supine LE Neural Mobilization  - 1 x daily - 7 x weekly - 2 sets - 10 reps - 5 hold - Seated Ankle Alphabet  - 1 x daily - 7 x weekly - 2 sets - 1 reps - 5 hold  ASSESSMENT:  CLINICAL IMPRESSION:  ***. Pt will continue to benefit from skilled therapy to address remaining deficits in order to improve overall QoL and return to PLOF.      OBJECTIVE IMPAIRMENTS: cardiopulmonary status limiting activity, decreased activity tolerance, decreased mobility, increased fascial restrictions, impaired perceived functional ability, increased muscle spasms, impaired flexibility, impaired UE functional use, improper body mechanics, postural dysfunction, and pain.   ACTIVITY LIMITATIONS: carrying, lifting, bed mobility, bathing, dressing, self feeding, reach over head, and caring for others  PARTICIPATION LIMITATIONS: meal prep, cleaning, laundry, personal finances, interpersonal relationship, driving,  shopping, community activity, yard work, church, and caregiving for husband  PERSONAL FACTORS: Age, Fitness, Past/current experiences, Time since onset of injury/illness/exacerbation, and 3+ comorbidities: aorta type A dissection in sept 2011 with aortic valve replacement bypass of R coronary artery, AAA, anemia, anxiety, CAD, CHF, depression, GERD, Heart failure, HLD, HTN, IBS, Kidney problems, migraine, osteoarthritis, osteoporosis, RA, ocular migraines, back pain  are also affecting patient's functional outcome.   REHAB POTENTIAL: Fair    CLINICAL DECISION MAKING: Evolving/moderate complexity  EVALUATION COMPLEXITY: Moderate   GOALS: Goals reviewed with patient? Yes  SHORT TERM GOALS: Target date: 12/27/2023  Patient will be independent in home exercise program to improve strength/mobility for better functional independence with ADLs. Baseline:7/23: HEP given 9/16: compliant 1x/week  Goal status: On going     LONG TERM GOALS: Target date: 03/22/2024   Patient will increase FOTO score to equal to or greater than  57%   to demonstrate statistically significant improvement in mobility and quality of life.  Baseline: 7/22: 46 9/16: 50% 10/7: 40% 12/10: 57% Goal status: MET  2.  Patient will improve shoulder AROM to > 140 degrees of flexion, scaption, and abduction for improved ability to perform overhead activities. Baseline: 7/22: see above 9/16: see above 10/7 : defer to exacerbation 12/10: shoulder abduction >140, flexion < 140 , assess scaption next visit 2/6: flexion >140, abduction >140 scaption >140 Goal status: MET  3.  Patient will demonstrate adequate shoulder ROM and strength to be able to shave and dress independently with pain less than 3/10. Baseline: 7/23: 6/10 pain with dressing, limited ROM, unable to test strength 9/16: 3/10 pain with dressing  Goal status: MET  4.  Patient will decrease Quick DASH score by > 8 points demonstrating reduced self-reported upper  extremity disability for L and R shoulder  Baseline: 7/23: L shoulder 50% 9/16: R=54.54 % L 66% L 81% R 24% 12/10: R: 43.2% L: 56.8% 2/6: L 22%  R 34%  Goal status: MET  5.  Patient will be able to eat an entire meal without resting arm demonstrating improved functional strength Baseline: 7/23: has to rest while eating  due to pain. 9/16: able to make halfway through meal 10/7: can make it about halfway through meal, some meals can get 3/4s through meal 12/10: improved, still has to rest every once in a while 2/6: can get halfway to 2/3rds of the meal.  Goal status: Partially Met  6.  Patient will be able to stand 15 minutes without her LLE going numb allowing for ADLS such as showering and iADLs such as cooking Baseline: 5 minutes 12/10: will assess next visit 2/6: 10 minutes  Goal status: IN PROGRESS  7. Patient will increase six minute walk test distance to >1500 for progression to community ambulator and improve gait ability Baseline: 1160 ft  12/10: 1010 ft 2/6: 1140 ft  Goal status: IN PROGRESS  8  Patient will reduce modified Oswestry score to <20 as to demonstrate minimal disability with ADLs including improved sleeping tolerance, walking/sitting tolerance etc for better mobility with ADLs.  Baseline: 2/6: 60% Goal status: New PLAN:  PT FREQUENCY: 2x/week  PT DURATION: 8 weeks  PLANNED INTERVENTIONS: Therapeutic exercises, Therapeutic activity, Neuromuscular re-education, Balance training, Gait training, Patient/Family education, Self Care, Joint mobilization, Vestibular training, Canalith repositioning, Visual/preceptual remediation/compensation, Dry Needling, Electrical stimulation, Spinal mobilization, Cryotherapy, Moist heat, Compression bandaging, scar mobilization, Splintting, Taping, Traction, Ultrasound, Biofeedback, Ionotophoresis 4mg /ml Dexamethasone, Manual therapy, and Re-evaluation  PLAN FOR NEXT SESSION:   Continue with LUE strengthening  L ankle strengthening,  distraction, core activation, reassess scaption and flexion ROM, make HEP     Precious Bard PT  Physical Therapist - [facility]  [facility]  03/14/24, 3:37 PM

## 2024-03-15 ENCOUNTER — Ambulatory Visit: Payer: Medicare HMO

## 2024-03-15 DIAGNOSIS — M6281 Muscle weakness (generalized): Secondary | ICD-10-CM | POA: Diagnosis not present

## 2024-03-15 DIAGNOSIS — M25512 Pain in left shoulder: Secondary | ICD-10-CM | POA: Diagnosis not present

## 2024-03-15 DIAGNOSIS — G8929 Other chronic pain: Secondary | ICD-10-CM | POA: Diagnosis not present

## 2024-03-15 DIAGNOSIS — M25511 Pain in right shoulder: Secondary | ICD-10-CM | POA: Diagnosis not present

## 2024-03-15 DIAGNOSIS — R293 Abnormal posture: Secondary | ICD-10-CM | POA: Diagnosis not present

## 2024-03-15 DIAGNOSIS — M5459 Other low back pain: Secondary | ICD-10-CM | POA: Diagnosis not present

## 2024-03-15 DIAGNOSIS — R278 Other lack of coordination: Secondary | ICD-10-CM

## 2024-03-15 DIAGNOSIS — R2689 Other abnormalities of gait and mobility: Secondary | ICD-10-CM

## 2024-03-15 DIAGNOSIS — M25612 Stiffness of left shoulder, not elsewhere classified: Secondary | ICD-10-CM | POA: Diagnosis not present

## 2024-03-15 NOTE — Therapy (Signed)
 OUTPATIENT PHYSICAL THERAPY TREATMENT      Patient Name: Rachel Torres MRN: [id number] DOB:09-03-1956, 68 y.o., female Today's Date: 03/15/2024  END OF SESSION:  PT End of Session - 03/15/24 1404     Visit Number 26    Number of Visits 35    Date for PT Re-Evaluation 03/22/24    Authorization Type [employer]  Medicare HMO    Authorization Time Period 01/26/24-03/22/24    Progress Note Due on Visit 10    PT Start Time 1400    PT Stop Time 1440    PT Time Calculation (min) 40 min    Activity Tolerance Patient tolerated treatment well;No increased pain    Behavior During Therapy WFL for tasks assessed/performed              Past Medical History:  Diagnosis Date   AAA (abdominal aortic aneurysm) (HCC)    a. Chronic w/o evidence of aneurysmal dil on CTA 01/2016.   Alcohol abuse    Allergy    Anemia    Anxiety    Asthma    CAD (coronary artery disease)    a. 08/2010 s/p CABG x 1 (VG->RCA) @ time of Ao dissection repair; b. 01/2016 Lexiscan MV: mid antsept/apical defect w/ ? peri-infarct ischemia-->likely attenuation-->Med Rx.   Chewing difficulty    Chronic combined systolic (congestive) and diastolic (congestive) heart failure (HCC)    a. 2012 EF 30-35%; b. 04/2015 EF 40-45%; c. 01/2016 Echo: Ef 55-65%, nl AoV bioprosthesis, nl RV, nl PASP.   Constipation    Depression    Edema of both lower extremities    Gallbladder sludge    GERD (gastroesophageal reflux disease)    Heart failure (HCC)    Heart valve problem    History of Bicuspid Aortic Valve    a. 08/2010 s/p AVR @ time of Ao dissection repair; b. 01/2016 Echo: Ef 55-65%, nl AoV bioprosthesis, nl RV, nl PASP.   Hx of repair of dissecting thoracic aortic aneurysm, Stanford type A    a. 08/2010 s/p repair with AVR and VG->RCA; b. 01/2016 CTA: stable appearance of Asc Thoracic Aortic graft. Opacification of flase lumen of chronic abd Ao dissection w/ retrograde flow through lumbar arteries. No aneurysmal dil of flase lumen.    Hyperlipidemia    Hypertension    Hypertensive heart disease    IBS (irritable bowel syndrome)    Internal hemorrhoids    Kidney problem    Marfan syndrome    pt denies   Migraine    Obstructive sleep apnea    Osteoarthritis    Osteoporosis    Palpitations    Pneumonia    RA (rheumatoid arthritis) (HCC)    a. Followed by Dr. Mallie Mussel   Past Surgical History:  Procedure Laterality Date   ABDOMINAL AORTIC ANEURYSM REPAIR     CARDIAC CATHETERIZATION  2001   CESAREAN SECTION  1986 & 1991   CHOLECYSTECTOMY     COLONOSCOPY     COLONOSCOPY WITH PROPOFOL N/A 01/19/2024   Procedure: COLONOSCOPY WITH PROPOFOL;  Surgeon: Wyline Mood, MD;  Location: [facility] ENDOSCOPY;  Service: Gastroenterology;  Laterality: N/A;   ESOPHAGOGASTRODUODENOSCOPY (EGD) WITH PROPOFOL N/A 05/08/2020   Procedure: ESOPHAGOGASTRODUODENOSCOPY (EGD) WITH PROPOFOL;  Surgeon: Pasty Spillers, MD;  Location: [facility] ENDOSCOPY;  Service: Endoscopy;  Laterality: N/A;   FRACTURE SURGERY Right    shoulder replacement   HEMORRHOID BANDING     ORIF DISTAL RADIUS FRACTURE Left    POLYPECTOMY  01/19/2024  Procedure: POLYPECTOMY;  Surgeon: Wyline Mood, MD;  Location: [facility] ENDOSCOPY;  Service: Gastroenterology;;   UPPER GASTROINTESTINAL ENDOSCOPY     Patient Active Problem List   Diagnosis Date Noted   Adenomatous polyp of colon 01/19/2024   Heart failure with improved ejection fraction (HFimpEF) (HCC) 01/18/2024   Abnormal metabolism 11/14/2023   Weight regain after inital weight loss 11/14/2023   Multiple closed tarsal fractures of left foot, sequela 07/12/2023   Arm contusion, right, sequela 07/12/2023   Lumbar disc disease 07/12/2023   PAF (paroxysmal atrial fibrillation) (HCC) 06/09/2023   Paresthesia 06/03/2023   Obesity, Beginning BMI 35.45 02/17/2023   Tinnitus 01/14/2023   Change in vision 01/14/2023   Encounter for screening colonoscopy 01/14/2023   Personal history of fall 01/14/2023   Problem related to  unspecified psychosocial circumstances 12/28/2022   Person encountering health services to consult on behalf of another person 12/28/2022   Palpitation 12/10/2022   Fatigue 10/26/2022   B12 deficiency 10/26/2022   Right shoulder pain 09/15/2022   Depression 09/14/2022   Other constipation 05/12/2022   Medicare annual wellness visit, initial 07/17/2021   TMJ (dislocation of temporomandibular joint) 07/17/2021   Encounter for screening mammogram for breast cancer 06/05/2021   Diarrhea 04/11/2020   Epigastric pain 02/19/2020   Dark stools 02/19/2020   History of atrial fibrillation 02/05/2019   Shortness of breath 01/27/2019   Irregular surface of cornea 12/25/2018   Acute cough 09/08/2017   History of HPV infection 12/01/2016   Screening mammogram, encounter for 11/29/2016   Estrogen deficiency 11/29/2016   Encounter for routine gynecological examination 11/29/2016   Grade III hemorrhoids 10/06/2016   Tachycardia 08/14/2016   Hemorrhoids 08/13/2016   Atypical migraine 08/03/2016   CAD (coronary artery disease)    AAA (abdominal aortic aneurysm) (HCC)    Chronic combined systolic (congestive) and diastolic (congestive) heart failure (HCC)    Prediabetes 02/11/2016   Generalized anxiety disorder 12/08/2015   Panic attacks 07/28/2015   Chest pain 07/09/2013   Sensorineural hearing loss 03/27/2013   Vitamin D deficiency 07/03/2012   H/O aortic dissection 05/12/2012   Routine general medical examination at a health care facility 09/20/2011   Atypical chest pain 09/15/2011   S/P AVR (aortic valve replacement) 01/07/2011   CORONARY ARTERY BYPASS GRAFT, HX OF 01/07/2011   Arthritis 12/20/2010   Back pain 12/15/2010   H/O dissecting abdominal aortic aneurysm repair 08/24/2010   IRRITABLE BOWEL SYNDROME 09/09/2009   Obstructive sleep apnea 08/04/2009   External hemorrhoid 06/26/2009   Osteoporosis 01/09/2009   Hyperlipidemia 07/26/2007   Depression with anxiety 07/26/2007    Essential hypertension 07/26/2007   Allergic rhinitis 07/26/2007   Asthma 07/26/2007   GERD 07/26/2007   Osteoarthritis 07/26/2007   Ocular migraine 07/26/2007   Mild intermittent asthma without complication 07/26/2007    PCP: [name] MD   REFERRING PROVIDER: Stormy Card  REFERRING DIAG: L rotator cuff   THERAPY DIAG:  Stiffness of left shoulder, not elsewhere classified  Other abnormalities of gait and mobility  Abnormal posture  Other low back pain  Left shoulder pain, unspecified chronicity  Chronic right shoulder pain  Other lack of coordination  Muscle weakness (generalized)  Acute pain of left shoulder  Rationale for Evaluation and Treatment: Rehabilitation  ONSET DATE: Feb 2024  SUBJECTIVE:  SUBJECTIVE STATEMENT: Still having intermittent Left Leg paresthesis in upper limb, lateral with prolonged standing, most recently at [employer]. Pt sees neurologist on 03/27/24. Still having Left ankle DF weakness, no falls since past session.   Hand dominance: Left  PERTINENT HISTORY: Rachel Torres is a 66yoF familiar to our services. Pt referred to PT for strengthening of chronic left shoulder DJD, known remote RC injury. Recently fell and broke four metatarsals, since cleared from use of CAM rocker. She was treated successfully in past for right shoulder pain and has past medical history of Right shoulder surgery replacement. 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 PMH aorta type A dissection in sept 2011 s/p aortic valve replacement, bypass of R coronary artery, anemia, anxiety, CAD, CHF, depression, GERD, Heart failure, HLD, HTN, IBS, migraine, osteoarthritis, osteoporosis, RA, back pain.    Back pain:  Patient has new referral for radiculopathy, lumbar region. MRI showed  degeneration of L1-2 and L2-3 with some disc space collapse and minor retrolisthesis of L2-3. Issue began in February, felt numbness in L thigh. No cause known, but she was at a museum and noticed it was numb. Sometimes L foot numbness and toe numbness of 2nd and 3rd toe causing tripping. This caused her to trip July 4th and break five metatarsals. Some minor pain in the low pain noted. Pain noted to be 2-3/10 Pain in hip 3-4/10 of L hip.    PAIN:  Are you having pain? Yes, 3/10 pain in lateral deltoids in the shoulder.  PRECAUTIONS: None  FALLS:  Has patient fallen in last 6 months? Yes. Number of falls 2  PATIENT GOALS:to lift more than 3lb with LUE, be able to eat a meal without arm being tired.    OBJECTIVE:  DIAGNOSTIC FINDINGS:  IMPRESSION: Mild degenerative change in the cervical spine without acute fracture or traumatic subluxation.   IMPRESSION: Full width, full width tears of the supraspinatus and infraspinatus tendons with retraction to the glenoid. Grade 2/3 infraspinatus atrophy.   Moderate tendinosis of the distal subscapularis tendon with articular sided fraying of the cephalad fibers.   Moderate intra-articular long head biceps tendinosis with fraying and possible partial tearing.   Mild glenohumeral and moderate AC joint osteoarthritis.   Degenerative superior labral tearing anteriorly and posteriorly.   PATIENT SURVEYS:  Quick Dash 50%  and FOTO 46%   COGNITION: Overall cognitive status: Within functional limits for tasks assessed                                  SENSATION: WFL   POSTURE: Slight rounded shoulders and shoulder elevation    UPPER EXTREMITY ROM:    Active ROM Right eval 9/16: recert RUE Left eval 9/16 RECERT LUE  Shoulder flexion 64 122 91 92  Shoulder extension        Shoulder abduction 95 88 52 82  Shoulder adduction        Shoulder internal rotation Unable to test at 90 61 degrees 5 40  Shoulder external rotation 18at side  of body -5 degrees at 90 degrees 34 35  Elbow flexion [facility]  WFL   Elbow extension [facility]  WFL   Wrist flexion        Wrist extension        Wrist ulnar deviation        Wrist radial deviation        Wrist pronation  Wrist supination        (Blank rows = not tested)   UPPER EXTREMITY MMT:   MMT Right eval Left eval  Shoulder flexion      Shoulder extension      Shoulder abduction      Shoulder adduction      Shoulder internal rotation      Shoulder external rotation      Middle trapezius      Lower trapezius      Elbow flexion      Elbow extension      Wrist flexion      Wrist extension      Wrist ulnar deviation      Wrist radial deviation      Wrist pronation      Wrist supination      Grip strength (lbs) 30 35  (Blank rows = not tested) Unable to test strength due to pain with motion   TRUNK ROM: Trunk Flexion 65  Trunk Extension 7  Trunk R SB Limited 25%  Trunk L SB WFL  Trunk R rotation WFL  Trunk L rotation Limited 25%    Ankle ROM Seated: Motion: AROM seated Right Left  Plantarflexion 4+ 3  Dorsiflexion 4+ 4  Inversion 4 3+  Eversion 4+ 3+    SHOULDER SPECIAL TESTS: Impingement tests:  unable to test due to pain in starting position SLAP lesions:  unable to test due to pain in starting position Instability tests:  unable to test due to pain in starting position Rotator cuff assessment:  unable to test due to pain in starting position Biceps assessment:  unable to test due to pain in starting position   LOW BACK SPECIAL TEST: SLR: negative  Scour test: negative FABER: negative Pelvic compression/distraction: negative  Slump test positive LLE  6 min walk test: 1160 ft increased left foot drag and antalgic gait pattern with foot eversion    JOINT MOBILITY TESTING:  AP: painful PA: painful Inferior: painful Distraction: reduces pain   Back mobilization hypomobile and painful to grade II mobilizations    PALPATION:  Tenderness to all  joint regions of GH     TODAY'S TREATMENT: DATE: 03/15/24  Trigger Point Dry Needling  Subsequent Treatment: Instructions provided previously at initial dry needling treatment.   Patient Verbal Consent Given: Yes Education Handout Provided: Previously Provided Muscles Treated: bilateral lumbar paraspinals L3-L4 bilat lumbar multifidus L3-L4 (0.6mmx60mm, 0.78mm x 75mm),, bilat gluteus medius, bilat gluteus maximus (0.59mm x , 0.48mm x 75mm) Electrical Stimulation Performed: No Treatment Response/Outcome: tolerated well  Needle size (see above)    -Myofascial sustained Ischaemic release to bilat posterior superior gluteals, bilat lumbar extensor bundle L3-L5 levels,  7.5 minutes at each region.   -Prone child's pose  -Quadruped pelvic tilt -short sitting trunk rotation/lateral flexion stretching   PATIENT EDUCATION: Education details: goals, POC  Person educated: Patient Education method: Explanation, Demonstration, Tactile cues, Verbal cues, and Handouts Education comprehension: verbalized understanding, returned demonstration, verbal cues required, and tactile cues required  HOME EXERCISE PROGRAM:  Access Code: [email or web address] URL: [email or web address] Date: 10/03/2023 Prepared by: Precious Bard  Exercises - Seated Scapular Retraction  - 1 x daily - 7 x weekly - 2 sets - 10 reps - 5 hold - Neck Sidebending  - 1 x daily - 7 x weekly - 2 sets - 2 reps - 30 hold - Supine Isometric Shoulder Extension with Towel  - 1 x daily - 7 x weekly - 2 sets -  10 reps - 5 hold - Supine Isometric Shoulder Adduction with Towel Roll  - 1 x daily - 7 x weekly - 2 sets - 10 reps - 5 hold - Seated Bilateral Shoulder Flexion Towel Slide at Table Top  - 1 x daily - 7 x weekly - 1 sets - 3 reps - 30 hold - Supine Posterior Pelvic Tilt  - 1 x daily - 7 x weekly - 2 sets - 5 reps - 30 hold - Supine LE Neural Mobilization  - 1 x daily - 7 x weekly - 2 sets - 10 reps - 5 hold - Seated  Ankle Alphabet  - 1 x daily - 7 x weekly - 2 sets - 1 reps - 5 hold  ASSESSMENT:  CLINICAL IMPRESSION:  Pt reports last few sessions have made big improvements in pain and motor function in Left leg, hence we continue with dry needling, myofasciall release, both of which improve eficacy of stretching routine thereafter. Pt demonstrates improved A/ROM and force production AEB velocity in Left seated hip flexion. Pt will continue to benefit from skilled therapy to address remaining deficits in order to improve overall QoL and return to PLOF.      OBJECTIVE IMPAIRMENTS: cardiopulmonary status limiting activity, decreased activity tolerance, decreased mobility, increased fascial restrictions, impaired perceived functional ability, increased muscle spasms, impaired flexibility, impaired UE functional use, improper body mechanics, postural dysfunction, and pain.   ACTIVITY LIMITATIONS: carrying, lifting, bed mobility, bathing, dressing, self feeding, reach over head, and caring for others  PARTICIPATION LIMITATIONS: meal prep, cleaning, laundry, personal finances, interpersonal relationship, driving, shopping, community activity, yard work, church, and caregiving for husband  PERSONAL FACTORS: Age, Fitness, Past/current experiences, Time since onset of injury/illness/exacerbation, and 3+ comorbidities: aorta type A dissection in sept 2011 with aortic valve replacement bypass of R coronary artery, AAA, anemia, anxiety, CAD, CHF, depression, GERD, Heart failure, HLD, HTN, IBS, Kidney problems, migraine, osteoarthritis, osteoporosis, RA, ocular migraines, back pain  are also affecting patient's functional outcome.   REHAB POTENTIAL: Fair    CLINICAL DECISION MAKING: Evolving/moderate complexity  EVALUATION COMPLEXITY: Moderate   GOALS: Goals reviewed with patient? Yes  SHORT TERM GOALS: Target date: 12/27/2023  Patient will be independent in home exercise program to improve strength/mobility for  better functional independence with ADLs. Baseline:7/23: HEP given 9/16: compliant 1x/week  Goal status: On going     LONG TERM GOALS: Target date: 03/22/2024  Patient will increase FOTO score to equal to or greater than  57%   to demonstrate statistically significant improvement in mobility and quality of life.  Baseline: 7/22: 46 9/16: 50% 10/7: 40% 12/10: 57% Goal status: MET  2.  Patient will improve shoulder AROM to > 140 degrees of flexion, scaption, and abduction for improved ability to perform overhead activities. Baseline: 7/22: see above 9/16: see above 10/7 : defer to exacerbation 12/10: shoulder abduction >140, flexion < 140 , assess scaption next visit 2/6: flexion >140, abduction >140 scaption >140 Goal status: MET  3.  Patient will demonstrate adequate shoulder ROM and strength to be able to shave and dress independently with pain less than 3/10. Baseline: 7/23: 6/10 pain with dressing, limited ROM, unable to test strength 9/16: 3/10 pain with dressing  Goal status: MET  4.  Patient will decrease Quick DASH score by > 8 points demonstrating reduced self-reported upper extremity disability for L and R shoulder  Baseline: 7/23: L shoulder 50% 9/16: R=54.54 % L 66% L 81% R 24% 12/10: R:  43.2% L: 56.8% 2/6: L 22%  R 34%  Goal status: MET  5.  Patient will be able to eat an entire meal without resting arm demonstrating improved functional strength Baseline: 7/23: has to rest while eating due to pain. 9/16: able to make halfway through meal 10/7: can make it about halfway through meal, some meals can get 3/4s through meal 12/10: improved, still has to rest every once in a while 2/6: can get halfway to 2/3rds of the meal.  Goal status: Partially Met  6.  Patient will be able to stand 15 minutes without her LLE going numb allowing for ADLS such as showering and iADLs such as cooking Baseline: 5 minutes 12/10: will assess next visit 2/6: 10 minutes  Goal status: IN PROGRESS  7.  Patient will increase six minute walk test distance to >1500 for progression to community ambulator and improve gait ability Baseline: 1160 ft  12/10: 1010 ft 2/6: 1140 ft  Goal status: IN PROGRESS  8  Patient will reduce modified Oswestry score to <20 as to demonstrate minimal disability with ADLs including improved sleeping tolerance, walking/sitting tolerance etc for better mobility with ADLs.  Baseline: 2/6: 60% Goal status: New PLAN:  PT FREQUENCY: 2x/week  PT DURATION: 8 weeks  PLANNED INTERVENTIONS: Therapeutic exercises, Therapeutic activity, Neuromuscular re-education, Balance training, Gait training, Patient/Family education, Self Care, Joint mobilization, Vestibular training, Canalith repositioning, Visual/preceptual remediation/compensation, Dry Needling, Electrical stimulation, Spinal mobilization, Cryotherapy, Moist heat, Compression bandaging, scar mobilization, Splintting, Taping, Traction, Ultrasound, Biofeedback, Ionotophoresis 4mg /ml Dexamethasone, Manual therapy, and Re-evaluation  PLAN FOR NEXT SESSION:  Continue with LUE strengthening  L ankle strengthening, distraction, core activation, reassess scaption and flexion ROM, make HEP   2:54 PM, 03/15/24 Rosamaria Lints, PT, DPT Physical Therapist - [facility]  Outpatient Physical Therapy- Main Campus [phone]     Rosamaria Lints PT  Physical Therapist - [facility]  03/15/24, 2:53 PM

## 2024-03-18 ENCOUNTER — Other Ambulatory Visit: Payer: Self-pay | Admitting: Cardiology

## 2024-03-19 ENCOUNTER — Ambulatory Visit (INDEPENDENT_AMBULATORY_CARE_PROVIDER_SITE_OTHER): Admitting: Psychology

## 2024-03-19 ENCOUNTER — Ambulatory Visit: Payer: Medicare HMO

## 2024-03-19 ENCOUNTER — Encounter: Payer: Self-pay | Admitting: Physical Therapy

## 2024-03-19 DIAGNOSIS — M5459 Other low back pain: Secondary | ICD-10-CM

## 2024-03-19 DIAGNOSIS — M6281 Muscle weakness (generalized): Secondary | ICD-10-CM | POA: Diagnosis not present

## 2024-03-19 DIAGNOSIS — R293 Abnormal posture: Secondary | ICD-10-CM | POA: Diagnosis not present

## 2024-03-19 DIAGNOSIS — M25512 Pain in left shoulder: Secondary | ICD-10-CM | POA: Diagnosis not present

## 2024-03-19 DIAGNOSIS — G8929 Other chronic pain: Secondary | ICD-10-CM | POA: Diagnosis not present

## 2024-03-19 DIAGNOSIS — M25612 Stiffness of left shoulder, not elsewhere classified: Secondary | ICD-10-CM | POA: Diagnosis not present

## 2024-03-19 DIAGNOSIS — F331 Major depressive disorder, recurrent, moderate: Secondary | ICD-10-CM

## 2024-03-19 DIAGNOSIS — M25511 Pain in right shoulder: Secondary | ICD-10-CM | POA: Diagnosis not present

## 2024-03-19 DIAGNOSIS — R2689 Other abnormalities of gait and mobility: Secondary | ICD-10-CM | POA: Diagnosis not present

## 2024-03-19 DIAGNOSIS — R278 Other lack of coordination: Secondary | ICD-10-CM | POA: Diagnosis not present

## 2024-03-19 NOTE — Progress Notes (Signed)
 [facility] Counselor/Therapist Progress Note  Patient ID: BREYANA FOLLANSBEE, MRN: [id number],    Date: 03/19/2024  Time Spent: 10:00am-10:50am  50 minutes   Treatment Type: Individual Therapy  Reported Symptoms: stress  Mental Status Exam: Appearance:  Casual     Behavior: Appropriate  Motor: Normal  Speech/Language:  Normal Rate  Affect: Appropriate  Mood: normal  Thought process: normal  Thought content:   WNL  Sensory/Perceptual disturbances:   WNL  Orientation: oriented to person, place, time/date, and situation  Attention: Good  Concentration: Good  Memory: WNL  Fund of knowledge:  Good  Insight:   Good  Judgment:  Good  Impulse Control: Good   Risk Assessment: Danger to Self:  No Self-injurious Behavior: No Danger to Others: No Duty to Warn:no Physical Aggression / Violence:No  Access to Firearms a concern: No  Gang Involvement:No   Subjective: Pt present for face-to-face individual therapy via video.  Pt consents to telehealth video session and is aware of limitations and benefits of virtual sessions.  Location of pt: home Location of therapist: home office.  Pt talked about her house renovations.   They are making progress.   Pt will be glad to have things done.   Pt talked about caregiving for [name].  Pt states she got impatient with [name] a few days ago bc he would not listen to her and would not use his walker.   Addressed their interaction and how it impacted pt.  Worked with pt on problem solving and stress management.   Pt is working on being more patient with [name].  Pt talked about her great niece who asks pt for money.   The great niece's mother committed suicide.   She does not manage money well but pt feels she needs to help her out.  There is some family drama that pt tries to set boundaries with.   Pt talked about her weight.  She wants to lose weight but is struggling.  She is going to [employer] [facility].    Worked on  self care strategies.  Provided supportive therapy.  Interventions: Cognitive Behavioral Therapy and Insight-Oriented  Diagnosis:  F33.1  Plan of Care: Recommend ongoing therapy.   Pt participated in setting treatment goals.   Pt needs a place to talk about stressors.  Plan to meet every two weeks.    Treatment Plan (target date: 03/23/2024) Client Abilities/Strengths  Pt is bright, engaging, and motivated for therapy.   Client Treatment Preferences  Individual therapy.  Client Statement of Needs  Improve coping skills.  Have a place to talk about stressors.   Symptoms  Depressed or irritable mood. Unresolved grief issues.  Problems Addressed  Unipolar Depression Goals 1. Alleviate depressive symptoms and return to previous level of effective functioning. 2. Appropriately grieve the loss in order to normalize mood and to return to previously adaptive level of functioning. Objective Learn and implement behavioral strategies to overcome depression. Target Date: 2024-03-23 Frequency: Biweekly  Progress: 40 Modality: individual  Related Interventions Engage the client in "behavioral activation," increasing his/her activity level and contact with sources of reward, while identifying processes that inhibit activation.  Use behavioral techniques such as instruction, rehearsal, role-playing, role reversal, as needed, to facilitate activity in the client's daily life; reinforce success. Assist the client in developing skills that increase the likelihood of deriving pleasure from behavioral activation (e.g., assertiveness skills, developing an exercise plan, less internal/more external focus, increased social involvement); reinforce  success. Objective Identify important people in life, past and present, and describe the quality, good and poor, of those relationships. Target Date: 2024-03-23 Frequency: Biweekly  Progress: 40 Modality: individual  Related Interventions Conduct Interpersonal  Therapy beginning with the assessment of the client's "interpersonal inventory" of important past and present relationships; develop a case formulation linking depression to grief, interpersonal role disputes, role transitions, and/or interpersonal deficits). Objective Learn and implement problem-solving and decision-making skills. Target Date: 2024-03-23 Frequency: Biweekly  Progress: 40 Modality: individual  Related Interventions Conduct Problem-Solving Therapy using techniques such as psychoeducation, modeling, and role-playing to teach client problem-solving skills (i.e., defining a problem specifically, generating possible solutions, evaluating the pros and cons of each solution, selecting and implementing a plan of action, evaluating the efficacy of the plan, accepting or revising the plan); role-play application of the problem-solving skill to a real life issue. Encourage in the client the development of a positive problem orientation in which problems and solving them are viewed as a natural part of life and not something to be feared, despaired, or avoided. 3. Develop healthy interpersonal relationships that lead to the alleviation and help prevent the relapse of depression. 4. Develop healthy thinking patterns and beliefs about self, others, and the world that lead to the alleviation and help prevent the relapse of depression. 5. Recognize, accept, and cope with feelings of depression. Diagnosis F33.1  Conditions For Discharge Achievement of treatment goals and objectives   Salomon Fick, LCSW

## 2024-03-19 NOTE — Therapy (Signed)
 OUTPATIENT PHYSICAL THERAPY TREATMENT      Patient Name: Rachel Torres MRN: [id number] DOB:[birth year], 68 y.o., female Today's Date: 03/19/2024  END OF SESSION:  PT End of Session - 03/19/24 1138     Visit Number 27    Number of Visits 35    Date for PT Re-Evaluation 03/22/24    Authorization Type [employer]  Medicare HMO    Authorization Time Period 01/26/24-03/22/24    Progress Note Due on Visit 10    PT Start Time 1140    PT Stop Time 1221    PT Time Calculation (min) 41 min    Activity Tolerance Patient tolerated treatment well;No increased pain    Behavior During Therapy WFL for tasks assessed/performed               Past Medical History:  Diagnosis Date   AAA (abdominal aortic aneurysm) (HCC)    a. Chronic w/o evidence of aneurysmal dil on CTA 01/2016.   Alcohol abuse    Allergy    Anemia    Anxiety    Asthma    CAD (coronary artery disease)    a. 08/2010 s/p CABG x 1 (VG->RCA) @ time of Ao dissection repair; b. 01/2016 Lexiscan MV: mid antsept/apical defect w/ ? peri-infarct ischemia-->likely attenuation-->Med Rx.   Chewing difficulty    Chronic combined systolic (congestive) and diastolic (congestive) heart failure (HCC)    a. 2012 EF 30-35%; b. 04/2015 EF 40-45%; c. 01/2016 Echo: Ef 55-65%, nl AoV bioprosthesis, nl RV, nl PASP.   Constipation    Depression    Edema of both lower extremities    Gallbladder sludge    GERD (gastroesophageal reflux disease)    Heart failure (HCC)    Heart valve problem    History of Bicuspid Aortic Valve    a. 08/2010 s/p AVR @ time of Ao dissection repair; b. 01/2016 Echo: Ef 55-65%, nl AoV bioprosthesis, nl RV, nl PASP.   Hx of repair of dissecting thoracic aortic aneurysm, Stanford type A    a. 08/2010 s/p repair with AVR and VG->RCA; b. 01/2016 CTA: stable appearance of Asc Thoracic Aortic graft. Opacification of flase lumen of chronic abd Ao dissection w/ retrograde flow through lumbar arteries. No aneurysmal dil of flase lumen.    Hyperlipidemia    Hypertension    Hypertensive heart disease    IBS (irritable bowel syndrome)    Internal hemorrhoids    Kidney problem    Marfan syndrome    pt denies   Migraine    Obstructive sleep apnea    Osteoarthritis    Osteoporosis    Palpitations    Pneumonia    RA (rheumatoid arthritis) (HCC)    a. Followed by Dr. Mallie Mussel   Past Surgical History:  Procedure Laterality Date   ABDOMINAL AORTIC ANEURYSM REPAIR     CARDIAC CATHETERIZATION  2001   CESAREAN SECTION  1986 & 1991   CHOLECYSTECTOMY     COLONOSCOPY     COLONOSCOPY WITH PROPOFOL N/A 01/19/2024   Procedure: COLONOSCOPY WITH PROPOFOL;  Surgeon: Wyline Mood, MD;  Location: [facility] ENDOSCOPY;  Service: Gastroenterology;  Laterality: N/A;   ESOPHAGOGASTRODUODENOSCOPY (EGD) WITH PROPOFOL N/A 05/08/2020   Procedure: ESOPHAGOGASTRODUODENOSCOPY (EGD) WITH PROPOFOL;  Surgeon: Pasty Spillers, MD;  Location: [facility] ENDOSCOPY;  Service: Endoscopy;  Laterality: N/A;   FRACTURE SURGERY Right    shoulder replacement   HEMORRHOID BANDING     ORIF DISTAL RADIUS FRACTURE Left    POLYPECTOMY  01/19/2024  Procedure: POLYPECTOMY;  Surgeon: Wyline Mood, MD;  Location: [facility] ENDOSCOPY;  Service: Gastroenterology;;   UPPER GASTROINTESTINAL ENDOSCOPY     Patient Active Problem List   Diagnosis Date Noted   Adenomatous polyp of colon 01/19/2024   Heart failure with improved ejection fraction (HFimpEF) (HCC) 01/18/2024   Abnormal metabolism 11/14/2023   Weight regain after inital weight loss 11/14/2023   Multiple closed tarsal fractures of left foot, sequela 07/12/2023   Arm contusion, right, sequela 07/12/2023   Lumbar disc disease 07/12/2023   PAF (paroxysmal atrial fibrillation) (HCC) 06/09/2023   Paresthesia 06/03/2023   Obesity, Beginning BMI 35.45 02/17/2023   Tinnitus 01/14/2023   Change in vision 01/14/2023   Encounter for screening colonoscopy 01/14/2023   Personal history of fall 01/14/2023   Problem related to  unspecified psychosocial circumstances 12/28/2022   Person encountering health services to consult on behalf of another person 12/28/2022   Palpitation 12/10/2022   Fatigue 10/26/2022   B12 deficiency 10/26/2022   Right shoulder pain 09/15/2022   Depression 09/14/2022   Other constipation 05/12/2022   Medicare annual wellness visit, initial 07/17/2021   TMJ (dislocation of temporomandibular joint) 07/17/2021   Encounter for screening mammogram for breast cancer 06/05/2021   Diarrhea 04/11/2020   Epigastric pain 02/19/2020   Dark stools 02/19/2020   History of atrial fibrillation 02/05/2019   Shortness of breath 01/27/2019   Irregular surface of cornea 12/25/2018   Acute cough 09/08/2017   History of HPV infection 12/01/2016   Screening mammogram, encounter for 11/29/2016   Estrogen deficiency 11/29/2016   Encounter for routine gynecological examination 11/29/2016   Grade III hemorrhoids 10/06/2016   Tachycardia 08/14/2016   Hemorrhoids 08/13/2016   Atypical migraine 08/03/2016   CAD (coronary artery disease)    AAA (abdominal aortic aneurysm) (HCC)    Chronic combined systolic (congestive) and diastolic (congestive) heart failure (HCC)    Prediabetes 02/11/2016   Generalized anxiety disorder 12/08/2015   Panic attacks 07/28/2015   Chest pain 07/09/2013   Sensorineural hearing loss 03/27/2013   Vitamin D deficiency 07/03/2012   H/O aortic dissection 05/12/2012   Routine general medical examination at a health care facility 09/20/2011   Atypical chest pain 09/15/2011   S/P AVR (aortic valve replacement) 01/07/2011   CORONARY ARTERY BYPASS GRAFT, HX OF 01/07/2011   Arthritis 12/20/2010   Back pain 12/15/2010   H/O dissecting abdominal aortic aneurysm repair 08/24/2010   IRRITABLE BOWEL SYNDROME 09/09/2009   Obstructive sleep apnea 08/04/2009   External hemorrhoid 06/26/2009   Osteoporosis 01/09/2009   Hyperlipidemia 07/26/2007   Depression with anxiety 07/26/2007    Essential hypertension 07/26/2007   Allergic rhinitis 07/26/2007   Asthma 07/26/2007   GERD 07/26/2007   Osteoarthritis 07/26/2007   Ocular migraine 07/26/2007   Mild intermittent asthma without complication 07/26/2007    PCP: [name] MD   REFERRING PROVIDER: Stormy Card  REFERRING DIAG: L rotator cuff   THERAPY DIAG:  Other abnormalities of gait and mobility  Abnormal posture  Other low back pain  Rationale for Evaluation and Treatment: Rehabilitation  ONSET DATE: Feb 2024  SUBJECTIVE:  SUBJECTIVE STATEMENT:   Patient reports no pain on arrival but hasn't done a lot this morning. Patient sees neurologist on 03/27/24. Still having Left ankle DF weakness, no falls since past session.   Hand dominance: Left  PERTINENT HISTORY: Arabela Basaldua is a 66yoF familiar to our services. Pt referred to PT for strengthening of chronic left shoulder DJD, known remote RC injury. Recently fell and broke four metatarsals, since cleared from use of CAM rocker. She was treated successfully in past for right shoulder pain and has past medical history of Right shoulder surgery replacement. 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 PMH aorta type A dissection in sept 2011 s/p aortic valve replacement, bypass of R coronary artery, anemia, anxiety, CAD, CHF, depression, GERD, Heart failure, HLD, HTN, IBS, migraine, osteoarthritis, osteoporosis, RA, back pain.    Back pain:  Patient has new referral for radiculopathy, lumbar region. MRI showed degeneration of L1-2 and L2-3 with some disc space collapse and minor retrolisthesis of L2-3. Issue began in February, felt numbness in L thigh. No cause known, but she was at a museum and noticed it was numb. Sometimes L foot numbness and toe numbness of 2nd and 3rd toe  causing tripping. This caused her to trip July 4th and break five metatarsals. Some minor pain in the low pain noted. Pain noted to be 2-3/10 Pain in hip 3-4/10 of L hip.    PAIN:  Are you having pain? Yes, 3/10 pain in lateral deltoids in the shoulder.  PRECAUTIONS: None  FALLS:  Has patient fallen in last 6 months? Yes. Number of falls 2  PATIENT GOALS:to lift more than 3lb with LUE, be able to eat a meal without arm being tired.    OBJECTIVE:  DIAGNOSTIC FINDINGS:  IMPRESSION: Mild degenerative change in the cervical spine without acute fracture or traumatic subluxation.   IMPRESSION: Full width, full width tears of the supraspinatus and infraspinatus tendons with retraction to the glenoid. Grade 2/3 infraspinatus atrophy.   Moderate tendinosis of the distal subscapularis tendon with articular sided fraying of the cephalad fibers.   Moderate intra-articular long head biceps tendinosis with fraying and possible partial tearing.   Mild glenohumeral and moderate AC joint osteoarthritis.   Degenerative superior labral tearing anteriorly and posteriorly.   PATIENT SURVEYS:  Quick Dash 50%  and FOTO 46%   COGNITION: Overall cognitive status: Within functional limits for tasks assessed                                  SENSATION: WFL   POSTURE: Slight rounded shoulders and shoulder elevation    UPPER EXTREMITY ROM:    Active ROM Right eval 9/16: recert RUE Left eval 9/16 RECERT LUE  Shoulder flexion 64 122 91 92  Shoulder extension        Shoulder abduction 95 88 52 82  Shoulder adduction        Shoulder internal rotation Unable to test at 90 61 degrees 5 40  Shoulder external rotation 18at side of body -5 degrees at 90 degrees 34 35  Elbow flexion [facility]  WFL   Elbow extension [facility]  WFL   Wrist flexion        Wrist extension        Wrist ulnar deviation        Wrist radial deviation        Wrist pronation        Wrist  supination        (Blank rows = not  tested)   UPPER EXTREMITY MMT:   MMT Right eval Left eval  Shoulder flexion      Shoulder extension      Shoulder abduction      Shoulder adduction      Shoulder internal rotation      Shoulder external rotation      Middle trapezius      Lower trapezius      Elbow flexion      Elbow extension      Wrist flexion      Wrist extension      Wrist ulnar deviation      Wrist radial deviation      Wrist pronation      Wrist supination      Grip strength (lbs) 30 35  (Blank rows = not tested) Unable to test strength due to pain with motion   TRUNK ROM: Trunk Flexion 65  Trunk Extension 7  Trunk R SB Limited 25%  Trunk L SB WFL  Trunk R rotation WFL  Trunk L rotation Limited 25%    Ankle ROM Seated: Motion: AROM seated Right Left  Plantarflexion 4+ 3  Dorsiflexion 4+ 4  Inversion 4 3+  Eversion 4+ 3+    SHOULDER SPECIAL TESTS: Impingement tests:  unable to test due to pain in starting position SLAP lesions:  unable to test due to pain in starting position Instability tests:  unable to test due to pain in starting position Rotator cuff assessment:  unable to test due to pain in starting position Biceps assessment:  unable to test due to pain in starting position   LOW BACK SPECIAL TEST: SLR: negative  Scour test: negative FABER: negative Pelvic compression/distraction: negative  Slump test positive LLE  6 min walk test: 1160 ft increased left foot drag and antalgic gait pattern with foot eversion    JOINT MOBILITY TESTING:  AP: painful PA: painful Inferior: painful Distraction: reduces pain   Back mobilization hypomobile and painful to grade II mobilizations    PALPATION:  Tenderness to all joint regions of GH     TODAY'S TREATMENT: DATE: 03/19/24    Supine posterior pelvic tilts x 20  Supine lower trunk rotation x 10 each side  Supine SKTC 3 x 30 seconds each LE   Hot pack applied during the next 3 exercises Supine marching with GTB around knees  2 x 10 each LE Supine lower trunk rotation x 10 each side  Supine hip abduction with GTB around knees 2 x 10   Seated marching with GTB 2 x 10 - working on core activation  Seated paloff press with GTB x 10 each direction   Seated ankle DF with 2# AW draped over foot 2 x 10 each LE  Seated ankle alphabet uppercase and lowercase   PATIENT EDUCATION: Education details: goals, POC  Person educated: Patient Education method: Explanation, Demonstration, Tactile cues, Verbal cues, and Handouts Education comprehension: verbalized understanding, returned demonstration, verbal cues required, and tactile cues required  HOME EXERCISE PROGRAM:  Access Code: [email or web address] URL: [email or web address] Date: 10/03/2023 Prepared by: Precious Bard  Exercises - Seated Scapular Retraction  - 1 x daily - 7 x weekly - 2 sets - 10 reps - 5 hold - Neck Sidebending  - 1 x daily - 7 x weekly - 2 sets - 2 reps - 30 hold - Supine Isometric Shoulder Extension with Towel  - 1 x daily -  7 x weekly - 2 sets - 10 reps - 5 hold - Supine Isometric Shoulder Adduction with Towel Roll  - 1 x daily - 7 x weekly - 2 sets - 10 reps - 5 hold - Seated Bilateral Shoulder Flexion Towel Slide at Table Top  - 1 x daily - 7 x weekly - 1 sets - 3 reps - 30 hold - Supine Posterior Pelvic Tilt  - 1 x daily - 7 x weekly - 2 sets - 5 reps - 30 hold - Supine LE Neural Mobilization  - 1 x daily - 7 x weekly - 2 sets - 10 reps - 5 hold - Seated Ankle Alphabet  - 1 x daily - 7 x weekly - 2 sets - 1 reps - 5 hold  ASSESSMENT:  CLINICAL IMPRESSION:   Patient arrives to treatment session motivated to participate with no pain. Session focused on core and ankle DF strengthening. Tolerated session well with no increase in pain. Pt will continue to benefit from skilled therapy to address remaining deficits in order to improve overall QoL and return to PLOF.     OBJECTIVE IMPAIRMENTS: cardiopulmonary status limiting activity,  decreased activity tolerance, decreased mobility, increased fascial restrictions, impaired perceived functional ability, increased muscle spasms, impaired flexibility, impaired UE functional use, improper body mechanics, postural dysfunction, and pain.   ACTIVITY LIMITATIONS: carrying, lifting, bed mobility, bathing, dressing, self feeding, reach over head, and caring for others  PARTICIPATION LIMITATIONS: meal prep, cleaning, laundry, personal finances, interpersonal relationship, driving, shopping, community activity, yard work, church, and caregiving for husband  PERSONAL FACTORS: Age, Fitness, Past/current experiences, Time since onset of injury/illness/exacerbation, and 3+ comorbidities: aorta type A dissection in sept 2011 with aortic valve replacement bypass of R coronary artery, AAA, anemia, anxiety, CAD, CHF, depression, GERD, Heart failure, HLD, HTN, IBS, Kidney problems, migraine, osteoarthritis, osteoporosis, RA, ocular migraines, back pain  are also affecting patient's functional outcome.   REHAB POTENTIAL: Fair    CLINICAL DECISION MAKING: Evolving/moderate complexity  EVALUATION COMPLEXITY: Moderate   GOALS: Goals reviewed with patient? Yes  SHORT TERM GOALS: Target date: 12/27/2023  Patient will be independent in home exercise program to improve strength/mobility for better functional independence with ADLs. Baseline:7/23: HEP given 9/16: compliant 1x/week  Goal status: On going     LONG TERM GOALS: Target date: 03/22/2024  Patient will increase FOTO score to equal to or greater than  57%   to demonstrate statistically significant improvement in mobility and quality of life.  Baseline: 7/22: 46 9/16: 50% 10/7: 40% 12/10: 57% Goal status: MET  2.  Patient will improve shoulder AROM to > 140 degrees of flexion, scaption, and abduction for improved ability to perform overhead activities. Baseline: 7/22: see above 9/16: see above 10/7 : defer to exacerbation 12/10: shoulder  abduction >140, flexion < 140 , assess scaption next visit 2/6: flexion >140, abduction >140 scaption >140 Goal status: MET  3.  Patient will demonstrate adequate shoulder ROM and strength to be able to shave and dress independently with pain less than 3/10. Baseline: 7/23: 6/10 pain with dressing, limited ROM, unable to test strength 9/16: 3/10 pain with dressing  Goal status: MET  4.  Patient will decrease Quick DASH score by > 8 points demonstrating reduced self-reported upper extremity disability for L and R shoulder  Baseline: 7/23: L shoulder 50% 9/16: R=54.54 % L 66% L 81% R 24% 12/10: R: 43.2% L: 56.8% 2/6: L 22%  R 34%  Goal status: MET  5.  Patient will be able to eat an entire meal without resting arm demonstrating improved functional strength Baseline: 7/23: has to rest while eating due to pain. 9/16: able to make halfway through meal 10/7: can make it about halfway through meal, some meals can get 3/4s through meal 12/10: improved, still has to rest every once in a while 2/6: can get halfway to 2/3rds of the meal.  Goal status: Partially Met  6.  Patient will be able to stand 15 minutes without her LLE going numb allowing for ADLS such as showering and iADLs such as cooking Baseline: 5 minutes 12/10: will assess next visit 2/6: 10 minutes  Goal status: IN PROGRESS  7. Patient will increase six minute walk test distance to >1500 for progression to community ambulator and improve gait ability Baseline: 1160 ft  12/10: 1010 ft 2/6: 1140 ft  Goal status: IN PROGRESS  8  Patient will reduce modified Oswestry score to <20 as to demonstrate minimal disability with ADLs including improved sleeping tolerance, walking/sitting tolerance etc for better mobility with ADLs.  Baseline: 2/6: 60% Goal status: New PLAN:  PT FREQUENCY: 2x/week  PT DURATION: 8 weeks  PLANNED INTERVENTIONS: Therapeutic exercises, Therapeutic activity, Neuromuscular re-education, Balance training, Gait  training, Patient/Family education, Self Care, Joint mobilization, Vestibular training, Canalith repositioning, Visual/preceptual remediation/compensation, Dry Needling, Electrical stimulation, Spinal mobilization, Cryotherapy, Moist heat, Compression bandaging, scar mobilization, Splintting, Taping, Traction, Ultrasound, Biofeedback, Ionotophoresis 4mg /ml Dexamethasone, Manual therapy, and Re-evaluation  PLAN FOR NEXT SESSION:  Continue with LUE strengthening  L ankle strengthening, distraction, core activation, reassess scaption and flexion ROM, make HEP    Maylon Peppers, PT, DPT  Physical Therapist - [place]  [facility]  03/19/24, 11:39 AM

## 2024-03-20 ENCOUNTER — Ambulatory Visit: Payer: Medicare HMO | Admitting: Psychology

## 2024-03-21 NOTE — Therapy (Signed)
 OUTPATIENT PHYSICAL THERAPY TREATMENT/ RECERT       Patient Name: Rachel Torres MRN: [id number] DOB:15-Dec-1956, 68 y.o., female Today's Date: 03/22/2024  END OF SESSION:  PT End of Session - 03/22/24 0929     Visit Number 28    Number of Visits 44    Date for PT Re-Evaluation 05/17/24    Authorization Type [employer]  Medicare HMO    Authorization Time Period 01/26/24-03/22/24    Progress Note Due on Visit 10    PT Start Time 0930    PT Stop Time 1014    PT Time Calculation (min) 44 min    Activity Tolerance Patient tolerated treatment well;No increased pain    Behavior During Therapy WFL for tasks assessed/performed                Past Medical History:  Diagnosis Date   AAA (abdominal aortic aneurysm) (HCC)    a. Chronic w/o evidence of aneurysmal dil on CTA 01/2016.   Alcohol abuse    Allergy    Anemia    Anxiety    Asthma    CAD (coronary artery disease)    a. 08/2010 s/p CABG x 1 (VG->RCA) @ time of Ao dissection repair; b. 01/2016 Lexiscan MV: mid antsept/apical defect w/ ? peri-infarct ischemia-->likely attenuation-->Med Rx.   Chewing difficulty    Chronic combined systolic (congestive) and diastolic (congestive) heart failure (HCC)    a. 2012 EF 30-35%; b. 04/2015 EF 40-45%; c. 01/2016 Echo: Ef 55-65%, nl AoV bioprosthesis, nl RV, nl PASP.   Constipation    Depression    Edema of both lower extremities    Gallbladder sludge    GERD (gastroesophageal reflux disease)    Heart failure (HCC)    Heart valve problem    History of Bicuspid Aortic Valve    a. 08/2010 s/p AVR @ time of Ao dissection repair; b. 01/2016 Echo: Ef 55-65%, nl AoV bioprosthesis, nl RV, nl PASP.   Hx of repair of dissecting thoracic aortic aneurysm, Stanford type A    a. 08/2010 s/p repair with AVR and VG->RCA; b. 01/2016 CTA: stable appearance of Asc Thoracic Aortic graft. Opacification of flase lumen of chronic abd Ao dissection w/ retrograde flow through lumbar arteries. No aneurysmal dil of  flase lumen.   Hyperlipidemia    Hypertension    Hypertensive heart disease    IBS (irritable bowel syndrome)    Internal hemorrhoids    Kidney problem    Marfan syndrome    pt denies   Migraine    Obstructive sleep apnea    Osteoarthritis    Osteoporosis    Palpitations    Pneumonia    RA (rheumatoid arthritis) (HCC)    a. Followed by Dr. Mallie Mussel   Past Surgical History:  Procedure Laterality Date   ABDOMINAL AORTIC ANEURYSM REPAIR     CARDIAC CATHETERIZATION  2001   CESAREAN SECTION  1986 & 1991   CHOLECYSTECTOMY     COLONOSCOPY     COLONOSCOPY WITH PROPOFOL N/A 01/19/2024   Procedure: COLONOSCOPY WITH PROPOFOL;  Surgeon: Wyline Mood, MD;  Location: [facility] ENDOSCOPY;  Service: Gastroenterology;  Laterality: N/A;   ESOPHAGOGASTRODUODENOSCOPY (EGD) WITH PROPOFOL N/A 05/08/2020   Procedure: ESOPHAGOGASTRODUODENOSCOPY (EGD) WITH PROPOFOL;  Surgeon: Pasty Spillers, MD;  Location: [facility] ENDOSCOPY;  Service: Endoscopy;  Laterality: N/A;   FRACTURE SURGERY Right    shoulder replacement   HEMORRHOID BANDING     ORIF DISTAL RADIUS FRACTURE Left    POLYPECTOMY  01/19/2024   Procedure: POLYPECTOMY;  Surgeon: Wyline Mood, MD;  Location: [facility] ENDOSCOPY;  Service: Gastroenterology;;   UPPER GASTROINTESTINAL ENDOSCOPY     Patient Active Problem List   Diagnosis Date Noted   Adenomatous polyp of colon 01/19/2024   Heart failure with improved ejection fraction (HFimpEF) (HCC) 01/18/2024   Abnormal metabolism 11/14/2023   Weight regain after inital weight loss 11/14/2023   Multiple closed tarsal fractures of left foot, sequela 07/12/2023   Arm contusion, right, sequela 07/12/2023   Lumbar disc disease 07/12/2023   PAF (paroxysmal atrial fibrillation) (HCC) 06/09/2023   Paresthesia 06/03/2023   Obesity, Beginning BMI 35.45 02/17/2023   Tinnitus 01/14/2023   Change in vision 01/14/2023   Encounter for screening colonoscopy 01/14/2023   Personal history of fall 01/14/2023   Problem  related to unspecified psychosocial circumstances 12/28/2022   Person encountering health services to consult on behalf of another person 12/28/2022   Palpitation 12/10/2022   Fatigue 10/26/2022   B12 deficiency 10/26/2022   Right shoulder pain 09/15/2022   Depression 09/14/2022   Other constipation 05/12/2022   Medicare annual wellness visit, initial 07/17/2021   TMJ (dislocation of temporomandibular joint) 07/17/2021   Encounter for screening mammogram for breast cancer 06/05/2021   Diarrhea 04/11/2020   Epigastric pain 02/19/2020   Dark stools 02/19/2020   History of atrial fibrillation 02/05/2019   Shortness of breath 01/27/2019   Irregular surface of cornea 12/25/2018   Acute cough 09/08/2017   History of HPV infection 12/01/2016   Screening mammogram, encounter for 11/29/2016   Estrogen deficiency 11/29/2016   Encounter for routine gynecological examination 11/29/2016   Grade III hemorrhoids 10/06/2016   Tachycardia 08/14/2016   Hemorrhoids 08/13/2016   Atypical migraine 08/03/2016   CAD (coronary artery disease)    AAA (abdominal aortic aneurysm) (HCC)    Chronic combined systolic (congestive) and diastolic (congestive) heart failure (HCC)    Prediabetes 02/11/2016   Generalized anxiety disorder 12/08/2015   Panic attacks 07/28/2015   Chest pain 07/09/2013   Sensorineural hearing loss 03/27/2013   Vitamin D deficiency 07/03/2012   H/O aortic dissection 05/12/2012   Routine general medical examination at a health care facility 09/20/2011   Atypical chest pain 09/15/2011   S/P AVR (aortic valve replacement) 01/07/2011   CORONARY ARTERY BYPASS GRAFT, HX OF 01/07/2011   Arthritis 12/20/2010   Back pain 12/15/2010   H/O dissecting abdominal aortic aneurysm repair 08/24/2010   IRRITABLE BOWEL SYNDROME 09/09/2009   Obstructive sleep apnea 08/04/2009   External hemorrhoid 06/26/2009   Osteoporosis 01/09/2009   Hyperlipidemia 07/26/2007   Depression with anxiety  07/26/2007   Essential hypertension 07/26/2007   Allergic rhinitis 07/26/2007   Asthma 07/26/2007   GERD 07/26/2007   Osteoarthritis 07/26/2007   Ocular migraine 07/26/2007   Mild intermittent asthma without complication 07/26/2007    PCP: [name] MD   REFERRING PROVIDER: Stormy Card  REFERRING DIAG: L rotator cuff   THERAPY DIAG:  Other abnormalities of gait and mobility  Abnormal posture  Other low back pain  Stiffness of left shoulder, not elsewhere classified  Rationale for Evaluation and Treatment: Rehabilitation  ONSET DATE: Feb 2024  SUBJECTIVE:  SUBJECTIVE STATEMENT:   Patient reports her pain is improving but the leg weakness continues to be impairing her quality of life.   Hand dominance: Left  PERTINENT HISTORY: Laaibah Wartman is a 66yoF familiar to our services. Pt referred to PT for strengthening of chronic left shoulder DJD, known remote RC injury. Recently fell and broke four metatarsals, since cleared from use of CAM rocker. She was treated successfully in past for right shoulder pain and has past medical history of Right shoulder surgery replacement. 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 PMH aorta type A dissection in sept 2011 s/p aortic valve replacement, bypass of R coronary artery, anemia, anxiety, CAD, CHF, depression, GERD, Heart failure, HLD, HTN, IBS, migraine, osteoarthritis, osteoporosis, RA, back pain.    Back pain:  Patient has new referral for radiculopathy, lumbar region. MRI showed degeneration of L1-2 and L2-3 with some disc space collapse and minor retrolisthesis of L2-3. Issue began in February, felt numbness in L thigh. No cause known, but she was at a museum and noticed it was numb. Sometimes L foot numbness and toe numbness of 2nd and 3rd toe  causing tripping. This caused her to trip July 4th and break five metatarsals. Some minor pain in the low pain noted. Pain noted to be 2-3/10 Pain in hip 3-4/10 of L hip.    PAIN:  Are you having pain? Yes, 3/10 pain in lateral deltoids in the shoulder.  PRECAUTIONS: None  FALLS:  Has patient fallen in last 6 months? Yes. Number of falls 2  PATIENT GOALS:to lift more than 3lb with LUE, be able to eat a meal without arm being tired.    OBJECTIVE:  DIAGNOSTIC FINDINGS:  IMPRESSION: Mild degenerative change in the cervical spine without acute fracture or traumatic subluxation.   IMPRESSION: Full width, full width tears of the supraspinatus and infraspinatus tendons with retraction to the glenoid. Grade 2/3 infraspinatus atrophy.   Moderate tendinosis of the distal subscapularis tendon with articular sided fraying of the cephalad fibers.   Moderate intra-articular long head biceps tendinosis with fraying and possible partial tearing.   Mild glenohumeral and moderate AC joint osteoarthritis.   Degenerative superior labral tearing anteriorly and posteriorly.   PATIENT SURVEYS:  Quick Dash 50%  and FOTO 46%   COGNITION: Overall cognitive status: Within functional limits for tasks assessed                                  SENSATION: WFL   POSTURE: Slight rounded shoulders and shoulder elevation    UPPER EXTREMITY ROM:    Active ROM Right eval 9/16: recert RUE Left eval 9/16 RECERT LUE  Shoulder flexion 64 122 91 92  Shoulder extension        Shoulder abduction 95 88 52 82  Shoulder adduction        Shoulder internal rotation Unable to test at 90 61 degrees 5 40  Shoulder external rotation 18at side of body -5 degrees at 90 degrees 34 35  Elbow flexion [facility]  WFL   Elbow extension [facility]  WFL   Wrist flexion        Wrist extension        Wrist ulnar deviation        Wrist radial deviation        Wrist pronation        Wrist supination        (Blank rows =  not  tested)   UPPER EXTREMITY MMT:   MMT Right eval Left eval  Shoulder flexion      Shoulder extension      Shoulder abduction      Shoulder adduction      Shoulder internal rotation      Shoulder external rotation      Middle trapezius      Lower trapezius      Elbow flexion      Elbow extension      Wrist flexion      Wrist extension      Wrist ulnar deviation      Wrist radial deviation      Wrist pronation      Wrist supination      Grip strength (lbs) 30 35  (Blank rows = not tested) Unable to test strength due to pain with motion   TRUNK ROM: Trunk Flexion 65  Trunk Extension 7  Trunk R SB Limited 25%  Trunk L SB WFL  Trunk R rotation WFL  Trunk L rotation Limited 25%    Ankle ROM Seated: Motion: AROM seated Right Left  Plantarflexion 4+ 3  Dorsiflexion 4+ 4  Inversion 4 3+  Eversion 4+ 3+    SHOULDER SPECIAL TESTS: Impingement tests:  unable to test due to pain in starting position SLAP lesions:  unable to test due to pain in starting position Instability tests:  unable to test due to pain in starting position Rotator cuff assessment:  unable to test due to pain in starting position Biceps assessment:  unable to test due to pain in starting position   LOW BACK SPECIAL TEST: SLR: negative  Scour test: negative FABER: negative Pelvic compression/distraction: negative  Slump test positive LLE  6 min walk test: 1160 ft increased left foot drag and antalgic gait pattern with foot eversion    JOINT MOBILITY TESTING:  AP: painful PA: painful Inferior: painful Distraction: reduces pain   Back mobilization hypomobile and painful to grade II mobilizations    PALPATION:  Tenderness to all joint regions of GH     TODAY'S TREATMENT: DATE: 03/22/24    Physical therapy treatment session today consisted of completing assessment of goals and administration of testing as demonstrated and documented in flow sheet, treatment, and goals section of this note.  Addition treatments may be found below.   6 Min Walk Test:  Instructed patient to ambulate as quickly and as safely as possible for 6 minutes using LRAD. Patient was allowed to take standing rest breaks without stopping the test, but if the patient required a sitting rest break the clock would be stopped and the test would be over.  Results: 1201 feet using no AD  with cga. Results indicate that the patient has reduced endurance with ambulation compared to age matched norms.  Age Matched Norms: 44-69 yo M: 52 F: 32, 62-79 yo M: 74 F: 471, 68-89 yo M: 417 F: 392 MDC: 58.21 meters (190.98 feet) or 50 meters (ANPTA Core Set of Outcome Measures for Adults with Neurologic Conditions, 2018)   Manual:  Prone hip flexor lengthening 30 seconds each LE  Grade II mobilizations thoracic spine x multiple reps .  Grade II inferior SAD 3x30 Grade II inferior SAD with figure four position 3x30 seconds    TherEx Supine: -bring leg on top of other leg into figure four position 10x;  -nerve glide 25x each LE -posterior pelvic tilt 10x  RTB df/pf/inv/ever 15x    PATIENT  EDUCATION: Education details: goals, POC  Person educated: Patient Education method: Explanation, Demonstration, Tactile cues, Verbal cues, and Handouts Education comprehension: verbalized understanding, returned demonstration, verbal cues required, and tactile cues required  HOME EXERCISE PROGRAM:  Access Code: [email or web address] URL: [email or web address] Date: 10/03/2023 Prepared by: Precious Bard  Exercises - Seated Scapular Retraction  - 1 x daily - 7 x weekly - 2 sets - 10 reps - 5 hold - Neck Sidebending  - 1 x daily - 7 x weekly - 2 sets - 2 reps - 30 hold - Supine Isometric Shoulder Extension with Towel  - 1 x daily - 7 x weekly - 2 sets - 10 reps - 5 hold - Supine Isometric Shoulder Adduction with Towel Roll  - 1 x daily - 7 x weekly - 2 sets - 10 reps - 5 hold - Seated Bilateral Shoulder Flexion Towel Slide at  Table Top  - 1 x daily - 7 x weekly - 1 sets - 3 reps - 30 hold - Supine Posterior Pelvic Tilt  - 1 x daily - 7 x weekly - 2 sets - 5 reps - 30 hold - Supine LE Neural Mobilization  - 1 x daily - 7 x weekly - 2 sets - 10 reps - 5 hold - Seated Ankle Alphabet  - 1 x daily - 7 x weekly - 2 sets - 1 reps - 5 hold  ASSESSMENT:  CLINICAL IMPRESSION:   Patient's pain is progressing with decreased frequency of pain. Her LLE weakness continues to be an impairment and affects her functional mobility and standing.  Continued focus on strengthening and low back for POC for next re-cert. Patient is progressing with her six minute walk test with decreased episodes of foot drop. Pt will continue to benefit from skilled therapy to address remaining deficits in order to improve overall QoL and return to PLOF.     OBJECTIVE IMPAIRMENTS: cardiopulmonary status limiting activity, decreased activity tolerance, decreased mobility, increased fascial restrictions, impaired perceived functional ability, increased muscle spasms, impaired flexibility, impaired UE functional use, improper body mechanics, postural dysfunction, and pain.   ACTIVITY LIMITATIONS: carrying, lifting, bed mobility, bathing, dressing, self feeding, reach over head, and caring for others  PARTICIPATION LIMITATIONS: meal prep, cleaning, laundry, personal finances, interpersonal relationship, driving, shopping, community activity, yard work, church, and caregiving for husband  PERSONAL FACTORS: Age, Fitness, Past/current experiences, Time since onset of injury/illness/exacerbation, and 3+ comorbidities: aorta type A dissection in sept 2011 with aortic valve replacement bypass of R coronary artery, AAA, anemia, anxiety, CAD, CHF, depression, GERD, Heart failure, HLD, HTN, IBS, Kidney problems, migraine, osteoarthritis, osteoporosis, RA, ocular migraines, back pain  are also affecting patient's functional outcome.   REHAB POTENTIAL: Fair    CLINICAL  DECISION MAKING: Evolving/moderate complexity  EVALUATION COMPLEXITY: Moderate   GOALS: Goals reviewed with patient? Yes  SHORT TERM GOALS: Target date: 04/19/2024    Patient will be independent in home exercise program to improve strength/mobility for better functional independence with ADLs. Baseline:7/23: HEP given 9/16: compliant 1x/week 4/3: improving compliance Goal status: On going     LONG TERM GOALS: Target date: 05/17/2024    Patient will increase FOTO score to equal to or greater than  57%   to demonstrate statistically significant improvement in mobility and quality of life.  Baseline: 7/22: 46 9/16: 50% 10/7: 40% 12/10: 57% Goal status: MET  2.  Patient will improve shoulder AROM to > 140 degrees of flexion, scaption, and abduction for improved ability to  perform overhead activities. Baseline: 7/22: see above 9/16: see above 10/7 : defer to exacerbation 12/10: shoulder abduction >140, flexion < 140 , assess scaption next visit 2/6: flexion >140, abduction >140 scaption >140 Goal status: MET  3.  Patient will demonstrate adequate shoulder ROM and strength to be able to shave and dress independently with pain less than 3/10. Baseline: 7/23: 6/10 pain with dressing, limited ROM, unable to test strength 9/16: 3/10 pain with dressing  Goal status: MET  4.  Patient will decrease Quick DASH score by > 8 points demonstrating reduced self-reported upper extremity disability for L and R shoulder  Baseline: 7/23: L shoulder 50% 9/16: R=54.54 % L 66% L 81% R 24% 12/10: R: 43.2% L: 56.8% 2/6: L 22%  R 34%  Goal status: MET  5.  Patient will be able to eat an entire meal without resting arm demonstrating improved functional strength Baseline: 7/23: has to rest while eating due to pain. 9/16: able to make halfway through meal 10/7: can make it about halfway through meal, some meals can get 3/4s through meal 12/10: improved, still has to rest every once in a while 2/6: can get  halfway to 2/3rds of the meal.  Goal status: deferred at this time to focus on back/leg  6.  Patient will be able to stand 15 minutes without her LLE going numb allowing for ADLS such as showering and iADLs such as cooking Baseline: 5 minutes 12/10: will assess next visit 2/6: 10 minutes 4/3: 10 minutes Goal status: IN PROGRESS  7. Patient will increase six minute walk test distance to >1500 for progression to community ambulator and improve gait ability Baseline: 1160 ft  12/10: 1010 ft 2/6: 1140 ft  4/3: 1201 ft  Goal status: IN PROGRESS  8  Patient will reduce modified Oswestry score to <20 as to demonstrate minimal disability with ADLs including improved sleeping tolerance, walking/sitting tolerance etc for better mobility with ADLs.  Baseline: 2/6: 60% 4/3: 50%  Goal status: In progress  PLAN:  PT FREQUENCY: 2x/week  PT DURATION: 8 weeks  PLANNED INTERVENTIONS: Therapeutic exercises, Therapeutic activity, Neuromuscular re-education, Balance training, Gait training, Patient/Family education, Self Care, Joint mobilization, Vestibular training, Canalith repositioning, Visual/preceptual remediation/compensation, Dry Needling, Electrical stimulation, Spinal mobilization, Cryotherapy, Moist heat, Compression bandaging, scar mobilization, Splintting, Taping, Traction, Ultrasound, Biofeedback, Ionotophoresis 4mg /ml Dexamethasone, Manual therapy, and Re-evaluation  PLAN FOR NEXT SESSION:  Continue with LUE strengthening  L ankle strengthening, distraction, core activation, reassess scaption and flexion ROM, make HEP    Precious Bard, PT, DPT Physical Therapist - [facility]  Outpatient Physical Therapy- Main Campus [phone]    03/22/24, 10:14 AM

## 2024-03-22 ENCOUNTER — Ambulatory Visit: Payer: Medicare HMO | Attending: Orthopedic Surgery

## 2024-03-22 DIAGNOSIS — M25612 Stiffness of left shoulder, not elsewhere classified: Secondary | ICD-10-CM | POA: Insufficient documentation

## 2024-03-22 DIAGNOSIS — R2689 Other abnormalities of gait and mobility: Secondary | ICD-10-CM | POA: Insufficient documentation

## 2024-03-22 DIAGNOSIS — M5459 Other low back pain: Secondary | ICD-10-CM | POA: Insufficient documentation

## 2024-03-22 DIAGNOSIS — R293 Abnormal posture: Secondary | ICD-10-CM | POA: Insufficient documentation

## 2024-03-22 NOTE — Therapy (Addendum)
 OUTPATIENT PHYSICAL THERAPY TREATMENT      Patient Name: Rachel Torres MRN: [id number] DOB:May 03, 1956, 68 y.o., female Today's Date: 03/26/2024  END OF SESSION:  PT End of Session - 03/26/24 1144     Visit Number 29    Number of Visits 44    Date for PT Re-Evaluation 05/17/24    Authorization Type [employer]  Medicare HMO    Authorization Time Period 01/26/24-03/22/24    Progress Note Due on Visit 10    PT Start Time 1145    PT Stop Time 1229    PT Time Calculation (min) 44 min    Activity Tolerance Patient tolerated treatment well;No increased pain    Behavior During Therapy WFL for tasks assessed/performed                 Past Medical History:  Diagnosis Date   AAA (abdominal aortic aneurysm) (HCC)    a. Chronic w/o evidence of aneurysmal dil on CTA 01/2016.   Alcohol abuse    Allergy    Anemia    Anxiety    Asthma    CAD (coronary artery disease)    a. 08/2010 s/p CABG x 1 (VG->RCA) @ time of Ao dissection repair; b. 01/2016 Lexiscan MV: mid antsept/apical defect w/ ? peri-infarct ischemia-->likely attenuation-->Med Rx.   Chewing difficulty    Chronic combined systolic (congestive) and diastolic (congestive) heart failure (HCC)    a. 2012 EF 30-35%; b. 04/2015 EF 40-45%; c. 01/2016 Echo: Ef 55-65%, nl AoV bioprosthesis, nl RV, nl PASP.   Constipation    Depression    Edema of both lower extremities    Gallbladder sludge    GERD (gastroesophageal reflux disease)    Heart failure (HCC)    Heart valve problem    History of Bicuspid Aortic Valve    a. 08/2010 s/p AVR @ time of Ao dissection repair; b. 01/2016 Echo: Ef 55-65%, nl AoV bioprosthesis, nl RV, nl PASP.   Hx of repair of dissecting thoracic aortic aneurysm, Stanford type A    a. 08/2010 s/p repair with AVR and VG->RCA; b. 01/2016 CTA: stable appearance of Asc Thoracic Aortic graft. Opacification of flase lumen of chronic abd Ao dissection w/ retrograde flow through lumbar arteries. No aneurysmal dil of flase  lumen.   Hyperlipidemia    Hypertension    Hypertensive heart disease    IBS (irritable bowel syndrome)    Internal hemorrhoids    Kidney problem    Marfan syndrome    pt denies   Migraine    Obstructive sleep apnea    Osteoarthritis    Osteoporosis    Palpitations    Pneumonia    RA (rheumatoid arthritis) (HCC)    a. Followed by Dr. Mallie Mussel   Past Surgical History:  Procedure Laterality Date   ABDOMINAL AORTIC ANEURYSM REPAIR     CARDIAC CATHETERIZATION  2001   CESAREAN SECTION  1986 & 1991   CHOLECYSTECTOMY     COLONOSCOPY     COLONOSCOPY WITH PROPOFOL N/A 01/19/2024   Procedure: COLONOSCOPY WITH PROPOFOL;  Surgeon: Wyline Mood, MD;  Location: [facility] ENDOSCOPY;  Service: Gastroenterology;  Laterality: N/A;   ESOPHAGOGASTRODUODENOSCOPY (EGD) WITH PROPOFOL N/A 05/08/2020   Procedure: ESOPHAGOGASTRODUODENOSCOPY (EGD) WITH PROPOFOL;  Surgeon: Pasty Spillers, MD;  Location: [facility] ENDOSCOPY;  Service: Endoscopy;  Laterality: N/A;   FRACTURE SURGERY Right    shoulder replacement   HEMORRHOID BANDING     ORIF DISTAL RADIUS FRACTURE Left    POLYPECTOMY  01/19/2024   Procedure: POLYPECTOMY;  Surgeon: Wyline Mood, MD;  Location: [facility] ENDOSCOPY;  Service: Gastroenterology;;   UPPER GASTROINTESTINAL ENDOSCOPY     Patient Active Problem List   Diagnosis Date Noted   Adenomatous polyp of colon 01/19/2024   Heart failure with improved ejection fraction (HFimpEF) (HCC) 01/18/2024   Abnormal metabolism 11/14/2023   Weight regain after inital weight loss 11/14/2023   Multiple closed tarsal fractures of left foot, sequela 07/12/2023   Arm contusion, right, sequela 07/12/2023   Lumbar disc disease 07/12/2023   PAF (paroxysmal atrial fibrillation) (HCC) 06/09/2023   Paresthesia 06/03/2023   Obesity, Beginning BMI 35.45 02/17/2023   Tinnitus 01/14/2023   Change in vision 01/14/2023   Encounter for screening colonoscopy 01/14/2023   Personal history of fall 01/14/2023   Problem related  to unspecified psychosocial circumstances 12/28/2022   Person encountering health services to consult on behalf of another person 12/28/2022   Palpitation 12/10/2022   Fatigue 10/26/2022   B12 deficiency 10/26/2022   Right shoulder pain 09/15/2022   Depression 09/14/2022   Other constipation 05/12/2022   Medicare annual wellness visit, initial 07/17/2021   TMJ (dislocation of temporomandibular joint) 07/17/2021   Encounter for screening mammogram for breast cancer 06/05/2021   Diarrhea 04/11/2020   Epigastric pain 02/19/2020   Dark stools 02/19/2020   History of atrial fibrillation 02/05/2019   Shortness of breath 01/27/2019   Irregular surface of cornea 12/25/2018   Acute cough 09/08/2017   History of HPV infection 12/01/2016   Screening mammogram, encounter for 11/29/2016   Estrogen deficiency 11/29/2016   Encounter for routine gynecological examination 11/29/2016   Grade III hemorrhoids 10/06/2016   Tachycardia 08/14/2016   Hemorrhoids 08/13/2016   Atypical migraine 08/03/2016   CAD (coronary artery disease)    AAA (abdominal aortic aneurysm) (HCC)    Chronic combined systolic (congestive) and diastolic (congestive) heart failure (HCC)    Prediabetes 02/11/2016   Generalized anxiety disorder 12/08/2015   Panic attacks 07/28/2015   Chest pain 07/09/2013   Sensorineural hearing loss 03/27/2013   Vitamin D deficiency 07/03/2012   H/O aortic dissection 05/12/2012   Routine general medical examination at a health care facility 09/20/2011   Atypical chest pain 09/15/2011   S/P AVR (aortic valve replacement) 01/07/2011   CORONARY ARTERY BYPASS GRAFT, HX OF 01/07/2011   Arthritis 12/20/2010   Back pain 12/15/2010   H/O dissecting abdominal aortic aneurysm repair 08/24/2010   IRRITABLE BOWEL SYNDROME 09/09/2009   Obstructive sleep apnea 08/04/2009   External hemorrhoid 06/26/2009   Osteoporosis 01/09/2009   Hyperlipidemia 07/26/2007   Depression with anxiety 07/26/2007    Essential hypertension 07/26/2007   Allergic rhinitis 07/26/2007   Asthma 07/26/2007   GERD 07/26/2007   Osteoarthritis 07/26/2007   Ocular migraine 07/26/2007   Mild intermittent asthma without complication 07/26/2007    PCP: [name] MD   REFERRING PROVIDER: Stormy Card  REFERRING DIAG: L rotator cuff   THERAPY DIAG:  Other abnormalities of gait and mobility  Abnormal posture  Other low back pain  Rationale for Evaluation and Treatment: Rehabilitation  ONSET DATE: Feb 2024  SUBJECTIVE:  SUBJECTIVE STATEMENT:   Patient has been moving things into her house due to construction/renovation.   Hand dominance: Left  PERTINENT HISTORY: Caryle Helgeson is a 66yoF familiar to our services. Pt referred to PT for strengthening of chronic left shoulder DJD, known remote RC injury. Recently fell and broke four metatarsals, since cleared from use of CAM rocker. She was treated successfully in past for right shoulder pain and has past medical history of Right shoulder surgery replacement. 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 PMH aorta type A dissection in sept 2011 s/p aortic valve replacement, bypass of R coronary artery, anemia, anxiety, CAD, CHF, depression, GERD, Heart failure, HLD, HTN, IBS, migraine, osteoarthritis, osteoporosis, RA, back pain.    Back pain:  Patient has new referral for radiculopathy, lumbar region. MRI showed degeneration of L1-2 and L2-3 with some disc space collapse and minor retrolisthesis of L2-3. Issue began in February, felt numbness in L thigh. No cause known, but she was at a museum and noticed it was numb. Sometimes L foot numbness and toe numbness of 2nd and 3rd toe causing tripping. This caused her to trip July 4th and break five metatarsals. Some minor pain in  the low pain noted. Pain noted to be 2-3/10 Pain in hip 3-4/10 of L hip.    PAIN:  Are you having pain? Yes, 3/10 pain in lateral deltoids in the shoulder.  PRECAUTIONS: None  FALLS:  Has patient fallen in last 6 months? Yes. Number of falls 2  PATIENT GOALS:to lift more than 3lb with LUE, be able to eat a meal without arm being tired.    OBJECTIVE:  DIAGNOSTIC FINDINGS:  IMPRESSION: Mild degenerative change in the cervical spine without acute fracture or traumatic subluxation.   IMPRESSION: Full width, full width tears of the supraspinatus and infraspinatus tendons with retraction to the glenoid. Grade 2/3 infraspinatus atrophy.   Moderate tendinosis of the distal subscapularis tendon with articular sided fraying of the cephalad fibers.   Moderate intra-articular long head biceps tendinosis with fraying and possible partial tearing.   Mild glenohumeral and moderate AC joint osteoarthritis.   Degenerative superior labral tearing anteriorly and posteriorly.   PATIENT SURVEYS:  Quick Dash 50%  and FOTO 46%   COGNITION: Overall cognitive status: Within functional limits for tasks assessed                                  SENSATION: WFL   POSTURE: Slight rounded shoulders and shoulder elevation    UPPER EXTREMITY ROM:    Active ROM Right eval 9/16: recert RUE Left eval 9/16 RECERT LUE  Shoulder flexion 64 122 91 92  Shoulder extension        Shoulder abduction 95 88 52 82  Shoulder adduction        Shoulder internal rotation Unable to test at 90 61 degrees 5 40  Shoulder external rotation 18at side of body -5 degrees at 90 degrees 34 35  Elbow flexion [facility]  WFL   Elbow extension [facility]  WFL   Wrist flexion        Wrist extension        Wrist ulnar deviation        Wrist radial deviation        Wrist pronation        Wrist supination        (Blank rows = not tested)   UPPER EXTREMITY MMT:  MMT Right eval Left eval  Shoulder flexion      Shoulder  extension      Shoulder abduction      Shoulder adduction      Shoulder internal rotation      Shoulder external rotation      Middle trapezius      Lower trapezius      Elbow flexion      Elbow extension      Wrist flexion      Wrist extension      Wrist ulnar deviation      Wrist radial deviation      Wrist pronation      Wrist supination      Grip strength (lbs) 30 35  (Blank rows = not tested) Unable to test strength due to pain with motion   TRUNK ROM: Trunk Flexion 65  Trunk Extension 7  Trunk R SB Limited 25%  Trunk L SB WFL  Trunk R rotation WFL  Trunk L rotation Limited 25%    Ankle ROM Seated: Motion: AROM seated Right Left  Plantarflexion 4+ 3  Dorsiflexion 4+ 4  Inversion 4 3+  Eversion 4+ 3+    SHOULDER SPECIAL TESTS: Impingement tests:  unable to test due to pain in starting position SLAP lesions:  unable to test due to pain in starting position Instability tests:  unable to test due to pain in starting position Rotator cuff assessment:  unable to test due to pain in starting position Biceps assessment:  unable to test due to pain in starting position   LOW BACK SPECIAL TEST: SLR: negative  Scour test: negative FABER: negative Pelvic compression/distraction: negative  Slump test positive LLE  6 min walk test: 1160 ft increased left foot drag and antalgic gait pattern with foot eversion    JOINT MOBILITY TESTING:  AP: painful PA: painful Inferior: painful Distraction: reduces pain   Back mobilization hypomobile and painful to grade II mobilizations    PALPATION:  Tenderness to all joint regions of GH     TODAY'S TREATMENT: DATE: 03/26/24    Manual:   Grade II inferior SAD 3x30 Grade II inferior SAD with figure four position 3x30 seconds    TherEx Supine: -bring leg on top of other leg into figure four position 10x;  -hamstring stretch 60 seconds each LE -single knee to chest 60 seconds each LE -figure four stretch 60 seconds each  LE  -nerve glide 25x each LE -posterior pelvic tilt 10x -BTB abduction 15x -BTB march 15x -bridge 15x   BAPS board: -A/P 15x -med/lat 15x -circles clockwise 15x, counterclockwise 15x   Seated: 3lb ankle weights: -March 15x each LE -LAQ 15x each LE  -heel raise 15x  TherAct  Standing: Swiss ball TrA with march on plinth table 12x each LE Swiss ball rollout on table in standing 12x SLS 30 seconds x 2 trials heel toe raises 15x  PATIENT EDUCATION: Education details: goals, POC  Person educated: Patient Education method: Explanation, Demonstration, Tactile cues, Verbal cues, and Handouts Education comprehension: verbalized understanding, returned demonstration, verbal cues required, and tactile cues required  HOME EXERCISE PROGRAM:  Access Code: [email or web address] URL: [email or web address] Date: 10/03/2023 Prepared by: Precious Bard  Exercises - Seated Scapular Retraction  - 1 x daily - 7 x weekly - 2 sets - 10 reps - 5 hold - Neck Sidebending  - 1 x daily - 7 x weekly - 2 sets - 2 reps - 30 hold - Supine Isometric Shoulder Extension  with Towel  - 1 x daily - 7 x weekly - 2 sets - 10 reps - 5 hold - Supine Isometric Shoulder Adduction with Towel Roll  - 1 x daily - 7 x weekly - 2 sets - 10 reps - 5 hold - Seated Bilateral Shoulder Flexion Towel Slide at Table Top  - 1 x daily - 7 x weekly - 1 sets - 3 reps - 30 hold - Supine Posterior Pelvic Tilt  - 1 x daily - 7 x weekly - 2 sets - 5 reps - 30 hold - Supine LE Neural Mobilization  - 1 x daily - 7 x weekly - 2 sets - 10 reps - 5 hold - Seated Ankle Alphabet  - 1 x daily - 7 x weekly - 2 sets - 1 reps - 5 hold  ASSESSMENT:  CLINICAL IMPRESSION:   Patient educated on need for compliance with HEP between sessions. LE strengthening tolerated well with intermittent rest breaks.  She tolerates progressive strengthening in multiple positions with use of weights and challenges to stability. Pt will continue to benefit from  skilled therapy to address remaining deficits in order to improve overall QoL and return to PLOF.     OBJECTIVE IMPAIRMENTS: cardiopulmonary status limiting activity, decreased activity tolerance, decreased mobility, increased fascial restrictions, impaired perceived functional ability, increased muscle spasms, impaired flexibility, impaired UE functional use, improper body mechanics, postural dysfunction, and pain.   ACTIVITY LIMITATIONS: carrying, lifting, bed mobility, bathing, dressing, self feeding, reach over head, and caring for others  PARTICIPATION LIMITATIONS: meal prep, cleaning, laundry, personal finances, interpersonal relationship, driving, shopping, community activity, yard work, church, and caregiving for husband  PERSONAL FACTORS: Age, Fitness, Past/current experiences, Time since onset of injury/illness/exacerbation, and 3+ comorbidities: aorta type A dissection in sept 2011 with aortic valve replacement bypass of R coronary artery, AAA, anemia, anxiety, CAD, CHF, depression, GERD, Heart failure, HLD, HTN, IBS, Kidney problems, migraine, osteoarthritis, osteoporosis, RA, ocular migraines, back pain  are also affecting patient's functional outcome.   REHAB POTENTIAL: Fair    CLINICAL DECISION MAKING: Evolving/moderate complexity  EVALUATION COMPLEXITY: Moderate   GOALS: Goals reviewed with patient? Yes  SHORT TERM GOALS: Target date: 04/19/2024    Patient will be independent in home exercise program to improve strength/mobility for better functional independence with ADLs. Baseline:7/23: HEP given 9/16: compliant 1x/week 4/3: improving compliance Goal status: On going     LONG TERM GOALS: Target date: 05/17/2024    Patient will increase FOTO score to equal to or greater than  57%   to demonstrate statistically significant improvement in mobility and quality of life.  Baseline: 7/22: 46 9/16: 50% 10/7: 40% 12/10: 57% Goal status: MET  2.  Patient will improve  shoulder AROM to > 140 degrees of flexion, scaption, and abduction for improved ability to perform overhead activities. Baseline: 7/22: see above 9/16: see above 10/7 : defer to exacerbation 12/10: shoulder abduction >140, flexion < 140 , assess scaption next visit 2/6: flexion >140, abduction >140 scaption >140 Goal status: MET  3.  Patient will demonstrate adequate shoulder ROM and strength to be able to shave and dress independently with pain less than 3/10. Baseline: 7/23: 6/10 pain with dressing, limited ROM, unable to test strength 9/16: 3/10 pain with dressing  Goal status: MET  4.  Patient will decrease Quick DASH score by > 8 points demonstrating reduced self-reported upper extremity disability for L and R shoulder  Baseline: 7/23: L shoulder 50% 9/16: R=54.54 % L  66% L 81% R 24% 12/10: R: 43.2% L: 56.8% 2/6: L 22%  R 34%  Goal status: MET  5.  Patient will be able to eat an entire meal without resting arm demonstrating improved functional strength Baseline: 7/23: has to rest while eating due to pain. 9/16: able to make halfway through meal 10/7: can make it about halfway through meal, some meals can get 3/4s through meal 12/10: improved, still has to rest every once in a while 2/6: can get halfway to 2/3rds of the meal.  Goal status: deferred at this time to focus on back/leg  6.  Patient will be able to stand 15 minutes without her LLE going numb allowing for ADLS such as showering and iADLs such as cooking Baseline: 5 minutes 12/10: will assess next visit 2/6: 10 minutes 4/3: 10 minutes Goal status: IN PROGRESS  7. Patient will increase six minute walk test distance to >1500 for progression to community ambulator and improve gait ability Baseline: 1160 ft  12/10: 1010 ft 2/6: 1140 ft  4/3: 1201 ft  Goal status: IN PROGRESS  8  Patient will reduce modified Oswestry score to <20 as to demonstrate minimal disability with ADLs including improved sleeping tolerance, walking/sitting  tolerance etc for better mobility with ADLs.  Baseline: 2/6: 60% 4/3: 50%  Goal status: In progress  PLAN:  PT FREQUENCY: 2x/week  PT DURATION: 8 weeks  PLANNED INTERVENTIONS: Therapeutic exercises, Therapeutic activity, Neuromuscular re-education, Balance training, Gait training, Patient/Family education, Self Care, Joint mobilization, Vestibular training, Canalith repositioning, Visual/preceptual remediation/compensation, Dry Needling, Electrical stimulation, Spinal mobilization, Cryotherapy, Moist heat, Compression bandaging, scar mobilization, Splintting, Taping, Traction, Ultrasound, Biofeedback, Ionotophoresis 4mg /ml Dexamethasone, Manual therapy, and Re-evaluation  PLAN FOR NEXT SESSION:  Continue with LUE strengthening  L ankle strengthening, distraction, core activation, reassess scaption and flexion ROM, make HEP    Precious Bard, PT, DPT Physical Therapist - [facility]  Outpatient Physical Therapy- Main [facility] [phone]    03/26/24, 12:40 PM

## 2024-03-26 ENCOUNTER — Ambulatory Visit: Payer: Medicare HMO

## 2024-03-26 DIAGNOSIS — R293 Abnormal posture: Secondary | ICD-10-CM

## 2024-03-26 DIAGNOSIS — M5459 Other low back pain: Secondary | ICD-10-CM | POA: Diagnosis not present

## 2024-03-26 DIAGNOSIS — R2689 Other abnormalities of gait and mobility: Secondary | ICD-10-CM | POA: Diagnosis not present

## 2024-03-26 DIAGNOSIS — M25612 Stiffness of left shoulder, not elsewhere classified: Secondary | ICD-10-CM | POA: Diagnosis not present

## 2024-03-27 ENCOUNTER — Ambulatory Visit (INDEPENDENT_AMBULATORY_CARE_PROVIDER_SITE_OTHER): Payer: Medicare HMO | Admitting: Psychology

## 2024-03-27 DIAGNOSIS — R2689 Other abnormalities of gait and mobility: Secondary | ICD-10-CM | POA: Diagnosis not present

## 2024-03-27 DIAGNOSIS — G6289 Other specified polyneuropathies: Secondary | ICD-10-CM | POA: Diagnosis not present

## 2024-03-27 DIAGNOSIS — F331 Major depressive disorder, recurrent, moderate: Secondary | ICD-10-CM

## 2024-03-27 DIAGNOSIS — G5712 Meralgia paresthetica, left lower limb: Secondary | ICD-10-CM | POA: Diagnosis not present
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
## 2024-03-28 ENCOUNTER — Telehealth: Payer: Self-pay | Admitting: Cardiology

## 2024-03-28 DIAGNOSIS — I110 Hypertensive heart disease with heart failure: Secondary | ICD-10-CM | POA: Diagnosis not present

## 2024-03-28 DIAGNOSIS — I4891 Unspecified atrial fibrillation: Secondary | ICD-10-CM | POA: Diagnosis not present

## 2024-03-28 DIAGNOSIS — I480 Paroxysmal atrial fibrillation: Secondary | ICD-10-CM | POA: Diagnosis not present

## 2024-03-28 DIAGNOSIS — E785 Hyperlipidemia, unspecified: Secondary | ICD-10-CM | POA: Diagnosis not present

## 2024-03-28 DIAGNOSIS — I5022 Chronic systolic (congestive) heart failure: Secondary | ICD-10-CM | POA: Diagnosis not present

## 2024-03-28 NOTE — Telephone Encounter (Signed)
 STAT if HR is under 50 or over 120  (normal HR is 60-100 beats per minute)  What is your heart rate? 149 while on the phone  Do you have a log of your heart rate readings (document readings)? 140-150 since yesterday  Do you have any other symptoms? SOB on exertion

## 2024-03-28 NOTE — Telephone Encounter (Signed)
 Received stat call. Patient states that her watch started reading atrial fibrillation yesterday. Patient reports current heart rate of 148 and blood pressure 140/89. Patient reports that heart rate has been 140's-150's since yesterday. Patient with complaint of shortness of breath with minimal exertion. Advised patient to be evaluated in the ED. Advised patient she should not drive herself to ED. Patient verbalizes understanding.

## 2024-03-29 ENCOUNTER — Ambulatory Visit: Payer: Medicare HMO

## 2024-03-29 DIAGNOSIS — I4891 Unspecified atrial fibrillation: Secondary | ICD-10-CM | POA: Diagnosis not present

## 2024-03-30 DIAGNOSIS — I5042 Chronic combined systolic (congestive) and diastolic (congestive) heart failure: Secondary | ICD-10-CM | POA: Diagnosis not present

## 2024-03-30 DIAGNOSIS — Z7901 Long term (current) use of anticoagulants: Secondary | ICD-10-CM | POA: Diagnosis not present

## 2024-03-30 DIAGNOSIS — R0602 Shortness of breath: Secondary | ICD-10-CM | POA: Diagnosis not present

## 2024-03-30 DIAGNOSIS — I4892 Unspecified atrial flutter: Secondary | ICD-10-CM | POA: Diagnosis not present

## 2024-03-30 DIAGNOSIS — I2510 Atherosclerotic heart disease of native coronary artery without angina pectoris: Secondary | ICD-10-CM | POA: Diagnosis not present

## 2024-03-30 DIAGNOSIS — R9431 Abnormal electrocardiogram [ECG] [EKG]: Secondary | ICD-10-CM | POA: Diagnosis not present

## 2024-03-30 DIAGNOSIS — I110 Hypertensive heart disease with heart failure: Secondary | ICD-10-CM | POA: Diagnosis not present

## 2024-03-30 DIAGNOSIS — G4733 Obstructive sleep apnea (adult) (pediatric): Secondary | ICD-10-CM | POA: Diagnosis not present

## 2024-03-30 DIAGNOSIS — I4891 Unspecified atrial fibrillation: Secondary | ICD-10-CM | POA: Diagnosis not present

## 2024-03-30 DIAGNOSIS — I4819 Other persistent atrial fibrillation: Secondary | ICD-10-CM | POA: Diagnosis not present

## 2024-04-02 ENCOUNTER — Encounter: Payer: Self-pay | Admitting: Cardiovascular Disease

## 2024-04-02 ENCOUNTER — Ambulatory Visit: Payer: Medicare HMO

## 2024-04-02 ENCOUNTER — Ambulatory Visit: Attending: Cardiovascular Disease | Admitting: Cardiovascular Disease

## 2024-04-02 VITALS — BP 100/60 | HR 114 | Ht 65.0 in | Wt 220.2 lb

## 2024-04-02 DIAGNOSIS — R000 Tachycardia, unspecified: Secondary | ICD-10-CM | POA: Diagnosis not present

## 2024-04-02 DIAGNOSIS — I480 Paroxysmal atrial fibrillation: Secondary | ICD-10-CM

## 2024-04-02 DIAGNOSIS — I10 Essential (primary) hypertension: Secondary | ICD-10-CM | POA: Diagnosis not present

## 2024-04-02 DIAGNOSIS — I25118 Atherosclerotic heart disease of native coronary artery with other forms of angina pectoris: Secondary | ICD-10-CM | POA: Diagnosis not present

## 2024-04-02 DIAGNOSIS — I5032 Chronic diastolic (congestive) heart failure: Secondary | ICD-10-CM

## 2024-04-02 DIAGNOSIS — R002 Palpitations: Secondary | ICD-10-CM

## 2024-04-02 DIAGNOSIS — Z952 Presence of prosthetic heart valve: Secondary | ICD-10-CM | POA: Diagnosis not present

## 2024-04-02 NOTE — Telephone Encounter (Signed)
 Called patient and notified her of the following from Dr. Gollan.  Appears she was diagnosed with atrial fibrillation, report reviewed from the ER.  If we can contact her to see how we can help.  Does she want to discuss options in clinic, medications versus TEE cardioversion?  We can help with both if needed  Thx  TG   Patient verbalizes understanding. Patient scheduled to be seen in clinic 04/02/24.

## 2024-04-02 NOTE — H&P (View-Only) (Signed)
 Cardiology Office Note  Date:  04/02/2024   ID:  [facility], DOB February 07, 1956, MRN [id number]  PCP:  Rachel Curt, MD   Chief Complaint  Patient presents with   Atrial Fibrillation    Patient was at [facility] in [place] with  A-fib.  Patient c/o shortness of breath, leg weakness heart rate fluctuating.     HPI:  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  rushed to [facility] and underwent aortic valve replacement,  bypass of the right coronary artery with venous donor site from her right thigh,  aortic grafting for a extensive dissection of the  ascending aorta,  residual descending aortic dissection extending into the iliac artery,  She reports pathology of her aortic valve showed bicuspid valve EF 30 to 35,  up to 45 to 50% in August 2020, unchanged October 2023 chronic back pain, shortness of breath,  abdominal pain,  Chest pain episodes resolved by GI cocktail Essential hypertension OSA not on CPAP She presents today for follow-up of her aortic valve replacement and CABG, afib 3/24, A-fib ablation 1999  LOV 6/24 Irregular rhythm developed April 8th 2025 In the ER at [facility] April 9 with palpitations, tachycardia, Was given medication for rate control and discharged,  Carvedilol dose was increased, was told [facility] would call her to set up cardioversion She reports that nobody has called her to arrange this, she is interested in getting back to sinus rhythm Blood pressure on today's visit running low She reports compliance with her Eliquis  Prior episode of atrial flutter 3/24  cardioversion [facility], EP last seen May 2024  Has CPAP machine but is not using it  More stress at home, husband with dementia, worsening Parkinson's, she is concerned he has Lewy body  She feels her abdomen is more distended since she has been in atrial fibrillation/flutter over the past week Estimates her weight is up  5 pounds Taking Lasix 40 daily  did not tolerate Ozempic,  chronic GI issues  EKG personally reviewed by myself on todays visit EKG Interpretation Date/Time:  Monday April 02 2024 15:37:54 EDT Ventricular Rate:  114 PR Interval:    QRS Duration:  108 QT Interval:  370 QTC Calculation: 509 R Axis:   -11  Text Interpretation: Atrial flutter with variable A-V block with premature ventricular or aberrantly conducted complexes Incomplete left bundle branch block Left ventricular hypertrophy with repolarization abnormality ( R in aVL , Cornell product ) When compared with ECG of 17-Jun-2023 10:18, Atrial flutter has replaced Sinus rhythm Vent. rate has increased BY  39 BPM T wave inversion no longer evident in Anterior leads Inverted T waves have replaced nonspecific T wave abnormality in Lateral leads Confirmed by Belva Boyden [phone]) on 04/02/2024 3:43:49 PM   seen in the ER at [facility] on March 09, 2023 for palpitation was found to be in atrial fibrillation. She was put on Eliquis. She had a TEE guided cardioversion on March 17, 2023. She is symptomatic while in atrial fibrillation with fatigue.  Afib in the setting of abscess  TEE at [facility] 03/17/23 Cardioversion eliquis  Prior workup for chest discomfort Stress test 10/23 Pharmacological myocardial perfusion imaging study with no significant  ischemia Normal wall motion, EF estimated at 45% (significant GI uptake artifact noted) No EKG changes concerning for ischemia at peak stress or in recovery. Low risk scan   Echocardiogram with EF 45 to 50%,  stable valve  Last CT scan 8/22, followed by vascular  EKG personally reviewed by myself on todays visit Normal sinus rhythm rate 74 bpm no significant Rachel-T wave changes  Other past medical history reviewed covid 11/2019 Got it from husband ,he spent time in rehab  Previous  30 day monitor ordered for palpitations,  APCs and PVCs  Previous evaluations in the emergency room for chest pain.  Cardiac enzymes were negative , EKG unchanged .She had a CT scan dated 06/20/2013. She was transferred to [facility] given her complicated anatomy. images were evaluated by vascular surgery it was determined there have been no significant progression of her disease   Previous evaluation at [facility] for shortness of breath and chest discomfort. She had workup for cardiac issues including echocardiogram and PET stress, CTA of the chest showing stable descending aorta repair with type B. aortic dissection with opacification of the false lumen at the level of the renal arteries via a lumbar artery.   Echocardiogram showed ejection fraction had improved to 45-50%, mild MR, TR (ejection fraction improved from 30-35% in January 2012)   Previous H/o abdominal pain, further workup showing biliary stones and sludge. s/p  laparoscopic cholecystectomy at [facility].    PMH:   has a past medical history of AAA (abdominal aortic aneurysm) (HCC), Alcohol abuse, Allergy, Anemia, Anxiety, Asthma, CAD (coronary artery disease), Chewing difficulty, Chronic combined systolic (congestive) and diastolic (congestive) heart failure (HCC), Constipation, Depression, Edema of both lower extremities, Gallbladder sludge, GERD (gastroesophageal reflux disease), Heart failure (HCC), Heart valve problem, History of Bicuspid Aortic Valve, repair of dissecting thoracic aortic aneurysm, Stanford type A, Hyperlipidemia, Hypertension, Hypertensive heart disease, IBS (irritable bowel syndrome), Internal hemorrhoids, Kidney problem, Marfan syndrome, Migraine, Obstructive sleep apnea, Osteoarthritis, Osteoporosis, Palpitations, Pneumonia, and RA (rheumatoid arthritis) (HCC).  PSH:    Past Surgical History:  Procedure Laterality Date   ABDOMINAL AORTIC ANEURYSM REPAIR     CARDIAC CATHETERIZATION  2001   CESAREAN SECTION  1986 & 1991   CHOLECYSTECTOMY     COLONOSCOPY     COLONOSCOPY WITH PROPOFOL N/A 01/19/2024   Procedure: COLONOSCOPY WITH PROPOFOL;   Surgeon: Wyline Mood, MD;  Location: [facility];  Service: Gastroenterology;  Laterality: N/A;   ESOPHAGOGASTRODUODENOSCOPY (EGD) WITH PROPOFOL N/A 05/08/2020   Procedure: ESOPHAGOGASTRODUODENOSCOPY (EGD) WITH PROPOFOL;  Surgeon: Pasty Spillers, MD;  Location: [facility] ENDOSCOPY;  Service: Endoscopy;  Laterality: N/A;   FRACTURE SURGERY Right    shoulder replacement   HEMORRHOID BANDING     ORIF DISTAL RADIUS FRACTURE Left    POLYPECTOMY  01/19/2024   Procedure: POLYPECTOMY;  Surgeon: Wyline Mood, MD;  Location: [facility];  Service: Gastroenterology;;   UPPER GASTROINTESTINAL ENDOSCOPY      Current Outpatient Medications  Medication Sig Dispense Refill   acetaminophen (TYLENOL) 500 MG tablet Take 500 mg by mouth every 6 (six) hours as needed.     albuterol (VENTOLIN HFA) 108 (90 Base) MCG/ACT inhaler Inhale 1-2 puffs into the lungs every 6 (six) hours as needed for wheezing or shortness of breath. 3 each 3   ALPRAZolam (XANAX) 0.5 MG tablet Take 1 tablet (0.5 mg total) by mouth daily as needed for anxiety. 30 tablet 0   aluminum chloride (DRYSOL) 20 % external solution Apply to affected areas once daily at bedtime; once excessive sweating has stopped, may decrease to once or twice weekly, or as needed. Wash treated area in the morning. 35 mL 11   aspirin EC 81 MG tablet Take 81 mg by  mouth at bedtime.     Calcium Carbonate (CALCIUM 600 PO) Take 600 mg by mouth.     carvedilol (COREG) 12.5 MG tablet Take 18.75 mg by mouth 2 (two) times daily with a meal.     cetirizine (ZYRTEC) 10 MG tablet Take 1 tablet (10 mg total) by mouth daily as needed for allergies. 90 tablet 3   Cholecalciferol (VITAMIN D3) 50 MCG (2000 UT) CAPS Take 2,000 capsules by mouth. FOUR TIMES A WEEK     cyclobenzaprine (FLEXERIL) 10 MG tablet Take 0.5-1 tablets (5-10 mg total) by mouth 3 (three) times daily as needed. For headache 90 tablet 3   ELIQUIS 5 MG TABS tablet Take 1 tablet (5 mg total) by mouth 2 (two)  times daily. 180 tablet 3   ezetimibe (ZETIA) 10 MG tablet TAKE 1 TABLET EVERY DAY 90 tablet 3   fluticasone (FLONASE) 50 MCG/ACT nasal spray Place 2 sprays into both nostrils daily as needed for allergies or rhinitis. 48 g 3   furosemide (LASIX) 20 MG tablet Take 2 tablets (40 mg total) by mouth daily. 180 tablet 3   isosorbide mononitrate (IMDUR) 30 MG 24 hr tablet TAKE 1/2 TABLET EVERY DAY 45 tablet 3   lisinopril (ZESTRIL) 5 MG tablet Take 0.5 tablets (2.5 mg total) by mouth daily. 90 tablet 3   magnesium gluconate (MAGONATE) 500 MG tablet Take 500 mg by mouth at bedtime.     nitroGLYCERIN (NITROSTAT) 0.4 MG SL tablet Place 1 tablet (0.4 mg total) under the tongue every 5 (five) minutes as needed for chest pain. 25 tablet 3   omeprazole (PRILOSEC) 40 MG capsule TAKE 1 CAPSULE EVERY DAY 90 capsule 1   polyethylene glycol powder (GLYCOLAX/MIRALAX) 17 GM/SCOOP powder Take 17 g by mouth as needed. 255 g 0   potassium chloride (KLOR-CON M) 10 MEQ tablet Take 1 tablet (10 mEq total) by mouth 2 (two) times daily. 180 tablet 3   promethazine (PHENERGAN) 25 MG tablet Take 0.5 tablets (12.5 mg total) by mouth every 8 (eight) hours as needed for nausea. 90 tablet 3   promethazine-dextromethorphan (PROMETHAZINE-DM) 6.25-15 MG/5ML syrup Take 5 mLs by mouth 4 (four) times daily as needed. 118 mL 0   Spacer/Aero-Holding Chambers (COMPACT SPACE CHAMBER) DEVI USE WITH INHALER 1 each 0   TART CHERRY PO Take 1 capsule by mouth 2 (two) times daily.     topiramate (TOPAMAX) 50 MG tablet Take 1 tablet (50 mg total) by mouth at bedtime. 30 tablet 1   venlafaxine XR (EFFEXOR-XR) 150 MG 24 hr capsule TAKE ONE CAPSULE BY MOUTH ONE TIME DAILY WITH BREAKFAST 90 capsule 3   venlafaxine XR (EFFEXOR-XR) 37.5 MG 24 hr capsule TAKE ONE CAPSULE BY MOUTH ONE TIME DAILY WITH BREAKFAST 90 capsule 3   benzonatate (TESSALON) 200 MG capsule Take 1 capsule (200 mg total) by mouth 3 (three) times daily as needed for cough. Swallow  whole (Patient not taking: Reported on 04/02/2024) 30 capsule 1   doxycycline (VIBRA-TABS) 100 MG tablet Take 1 tablet (100 mg total) by mouth 2 (two) times daily. (Patient not taking: Reported on 04/02/2024) 14 tablet 0   No current facility-administered medications for this visit.    Allergies:   Alendronate sodium, Alendronate sodium, Gabapentin, Ginzing [ginseng], Hydrocod poli-chlorphe poli er, Hydrocodone-acetaminophen, Imitrex [sumatriptan], Oxycodone, Peanut-containing drug products, Tramadol, Hydrocodone-guaifenesin, Iodine, Prednisone, and Tape   Social History:  The patient  reports that she quit smoking about 45 years ago. Her smoking use included cigarettes. She  started smoking about 46 years ago. She has a 1.1 pack-year smoking history. She has never used smokeless tobacco. She reports current alcohol use. She reports that she does not use drugs.   Family History:   family history includes Aneurysm in her paternal grandmother; Anxiety disorder in her mother; Arthritis in her paternal grandfather, paternal grandmother, sister, and sister; Asthma in her brother, mother, sister, and sister; Colon cancer in her maternal grandmother; Depression in her mother; Diabetes in her brother, paternal grandfather, paternal grandmother, sister, and sister; Heart disease in her mother and sister; Hypertension in her mother; Irritable bowel syndrome in her mother; Lung cancer in her maternal grandfather; Lymphoma in her maternal grandmother; Melanoma in her maternal grandfather; Migraines in her brother, maternal grandmother, sister, and sister; Nephrolithiasis in her sister; Osteoporosis in her mother and sister; Other in her brother; Other (age of onset: 47) in her father; Stroke in her maternal grandmother and mother; Thyroid disease in her mother.    Review of Systems: Review of Systems  Constitutional: Negative.   HENT: Negative.    Respiratory:  Positive for shortness of breath.   Cardiovascular:   Positive for palpitations.  Gastrointestinal: Negative.   Musculoskeletal: Negative.   Neurological: Negative.   Psychiatric/Behavioral: Negative.    All other systems reviewed and are negative.  PHYSICAL EXAM: VS:  BP 100/60 (BP Location: Left Arm, Patient Position: Sitting, Cuff Size: Normal)   Pulse (!) 114   Ht 5\' 5"  (1.651 m)   Wt 220 lb 4 oz (99.9 kg)   SpO2 96%   BMI 36.65 kg/m  , BMI Body mass index is 36.65 kg/m. Constitutional:  oriented to person, place, and time. No distress.  HENT:  Head: Grossly normal Eyes:  no discharge. No scleral icterus.  Neck: No JVD, no carotid bruits  Cardiovascular: Irregularly irregular no murmurs appreciated Pulmonary/Chest: Clear to auscultation bilaterally, no wheezes or rails Abdominal: Soft.  no distension.  no tenderness.  Musculoskeletal: Normal range of motion Neurological:  normal muscle tone. Coordination normal. No atrophy Skin: Skin warm and dry Psychiatric: normal affect, pleasant  Recent Labs: 01/18/2024: ALT 20; BUN 20; Creatinine, Ser 0.91; Hemoglobin 13.5; Platelets 257.0; Potassium 4.4; Sodium 137; TSH 0.68    Lipid Panel Lab Results  Component Value Date   CHOL 191 01/18/2024   HDL 59.30 01/18/2024   LDLCALC 112 (H) 01/18/2024   TRIG 100.0 01/18/2024    Wt Readings from Last 3 Encounters:  04/02/24 220 lb 4 oz (99.9 kg)  01/30/24 214 lb (97.1 kg)  01/19/24 206 lb (93.4 kg)    ASSESSMENT AND PLAN:  Persistent atrial fibrillation/flutter Compliant with her Eliquis, and Coreg Will recommend cardioversion this week, tentatively scheduled Wednesday, April 16  Pure hypercholesterolemia Cholesterol is at goal on the current lipid regimen. No changes to the medications were made.  Essential hypertension -  Blood pressure running low likely exacerbated by atrial flutter Recommend she hold her lisinopril and isosorbide until after the cardioversion  CORONARY ARTERY BYPASS GRAFT Chronic stable angina Stable  symptoms Stress test October 2023 no ischemia  Dissection of thoracoabdominal aorta (HCC) Followed by vascular surgery in [facility], Dr. Anner Crete on  CT July 2024  H/O dissecting abdominal aortic aneurysm repair - MRI 2020  Stable on CT July 2024  Chronic diastolic CHF echo EF 45% to 50% October 2023 Weight appears higher in the setting of atrial flutter, recommend Lasix up to 40 twice daily until abdominal distention improves, post cardioversion  Abdominal pain Symptoms improved  Morbid obesity Previously tried Ozempic but had side effects Working with weight loss clinic  Adjustment disorder Significant stress at home, husband and mother Receiving some financial support from the [place] which is helping     Orders Placed This Encounter  Procedures   EKG 12-Lead     Signed, Juanda Noon, M.D., Ph.D. 04/02/2024  [facility] [place], [place] [phone]

## 2024-04-02 NOTE — Progress Notes (Signed)
 Cardiology Office Note  Date:  04/02/2024   ID:  Rachel Torres, DOB February 07, 1956, MRN [id number]  PCP:  Clemens Curt, MD   Chief Complaint  Patient presents with   Atrial Fibrillation    Patient was at [facility] in [place] with  A-fib.  Patient c/o shortness of breath, leg weakness heart rate fluctuating.     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  rushed to [facility] and underwent aortic valve replacement,  bypass of the right coronary artery with venous donor site from her right thigh,  aortic grafting for a extensive dissection of the  ascending aorta,  residual descending aortic dissection extending into the iliac artery,  She reports pathology of her aortic valve showed bicuspid valve EF 30 to 35,  up to 45 to 50% in August 2020, unchanged October 2023 chronic back pain, shortness of breath,  abdominal pain,  Chest pain episodes resolved by GI cocktail Essential hypertension OSA not on CPAP She presents today for follow-up of her aortic valve replacement and CABG, afib 3/24, A-fib ablation 1999  LOV 6/24 Irregular rhythm developed April 8th 2025 In the ER at [facility] April 9 with palpitations, tachycardia, Was given medication for rate control and discharged,  Carvedilol dose was increased, was told [facility] would call her to set up cardioversion She reports that nobody has called her to arrange this, she is interested in getting back to sinus rhythm Blood pressure on today's visit running low She reports compliance with her Eliquis  Prior episode of atrial flutter 3/24  cardioversion Known to Dr. Marven Slimmer, EP last seen May 2024  Has CPAP machine but is not using it  More stress at home, husband with dementia, worsening Parkinson's, she is concerned he has Lewy body  She feels her abdomen is more distended since she has been in atrial fibrillation/flutter over the past week Estimates her weight is up  5 pounds Taking Lasix 40 daily  did not tolerate Ozempic,  chronic GI issues  EKG personally reviewed by myself on todays visit EKG Interpretation Date/Time:  Monday April 02 2024 15:37:54 EDT Ventricular Rate:  114 PR Interval:    QRS Duration:  108 QT Interval:  370 QTC Calculation: 509 R Axis:   -11  Text Interpretation: Atrial flutter with variable A-V block with premature ventricular or aberrantly conducted complexes Incomplete left bundle branch block Left ventricular hypertrophy with repolarization abnormality ( R in aVL , Cornell product ) When compared with ECG of 17-Jun-2023 10:18, Atrial flutter has replaced Sinus rhythm Vent. rate has increased BY  39 BPM T wave inversion no longer evident in Anterior leads Inverted T waves have replaced nonspecific T wave abnormality in Lateral leads Confirmed by Belva Boyden [phone]) on 04/02/2024 3:43:49 PM   seen in the ER at [facility] on March 09, 2023 for palpitation was found to be in atrial fibrillation. She was put on Eliquis. She had a TEE guided cardioversion on March 17, 2023. She is symptomatic while in atrial fibrillation with fatigue.  Afib in the setting of abscess  TEE at [facility] 03/17/23 Cardioversion eliquis  Prior workup for chest discomfort Stress test 10/23 Pharmacological myocardial perfusion imaging study with no significant  ischemia Normal wall motion, EF estimated at 45% (significant GI uptake artifact noted) No EKG changes concerning for ischemia at peak stress or in recovery. Low risk scan   Echocardiogram with EF 45 to 50%,  stable valve  Last CT scan 8/22, followed by vascular  EKG personally reviewed by myself on todays visit Normal sinus rhythm rate 74 bpm no significant ST-T wave changes  Other past medical history reviewed covid 11/2019 Got it from husband ,he spent time in rehab  Previous  30 day monitor ordered for palpitations,  APCs and PVCs  Previous evaluations in the emergency room for chest pain.  Cardiac enzymes were negative , EKG unchanged .She had a CT scan dated 06/20/2013. She was transferred to [facility] given her complicated anatomy. images were evaluated by vascular surgery it was determined there have been no significant progression of her disease   Previous evaluation at [facility] for shortness of breath and chest discomfort. She had workup for cardiac issues including echocardiogram and PET stress, CTA of the chest showing stable descending aorta repair with type B. aortic dissection with opacification of the false lumen at the level of the renal arteries via a lumbar artery.   Echocardiogram showed ejection fraction had improved to 45-50%, mild MR, TR (ejection fraction improved from 30-35% in January 2012)   Previous H/o abdominal pain, further workup showing biliary stones and sludge. s/p  laparoscopic cholecystectomy at [facility].    PMH:   has a past medical history of AAA (abdominal aortic aneurysm) (HCC), Alcohol abuse, Allergy, Anemia, Anxiety, Asthma, CAD (coronary artery disease), Chewing difficulty, Chronic combined systolic (congestive) and diastolic (congestive) heart failure (HCC), Constipation, Depression, Edema of both lower extremities, Gallbladder sludge, GERD (gastroesophageal reflux disease), Heart failure (HCC), Heart valve problem, History of Bicuspid Aortic Valve, repair of dissecting thoracic aortic aneurysm, Stanford type A, Hyperlipidemia, Hypertension, Hypertensive heart disease, IBS (irritable bowel syndrome), Internal hemorrhoids, Kidney problem, Marfan syndrome, Migraine, Obstructive sleep apnea, Osteoarthritis, Osteoporosis, Palpitations, Pneumonia, and RA (rheumatoid arthritis) (HCC).  PSH:    Past Surgical History:  Procedure Laterality Date   ABDOMINAL AORTIC ANEURYSM REPAIR     CARDIAC CATHETERIZATION  2001   CESAREAN SECTION  1986 & 1991   CHOLECYSTECTOMY     COLONOSCOPY     COLONOSCOPY WITH PROPOFOL N/A 01/19/2024   Procedure: COLONOSCOPY WITH PROPOFOL;   Surgeon: Wyline Mood, MD;  Location: [facility] ENDOSCOPY;  Service: Gastroenterology;  Laterality: N/A;   ESOPHAGOGASTRODUODENOSCOPY (EGD) WITH PROPOFOL N/A 05/08/2020   Procedure: ESOPHAGOGASTRODUODENOSCOPY (EGD) WITH PROPOFOL;  Surgeon: Pasty Spillers, MD;  Location: [facility] ENDOSCOPY;  Service: Endoscopy;  Laterality: N/A;   FRACTURE SURGERY Right    shoulder replacement   HEMORRHOID BANDING     ORIF DISTAL RADIUS FRACTURE Left    POLYPECTOMY  01/19/2024   Procedure: POLYPECTOMY;  Surgeon: Wyline Mood, MD;  Location: [facility] ENDOSCOPY;  Service: Gastroenterology;;   UPPER GASTROINTESTINAL ENDOSCOPY      Current Outpatient Medications  Medication Sig Dispense Refill   acetaminophen (TYLENOL) 500 MG tablet Take 500 mg by mouth every 6 (six) hours as needed.     albuterol (VENTOLIN HFA) 108 (90 Base) MCG/ACT inhaler Inhale 1-2 puffs into the lungs every 6 (six) hours as needed for wheezing or shortness of breath. 3 each 3   ALPRAZolam (XANAX) 0.5 MG tablet Take 1 tablet (0.5 mg total) by mouth daily as needed for anxiety. 30 tablet 0   aluminum chloride (DRYSOL) 20 % external solution Apply to affected areas once daily at bedtime; once excessive sweating has stopped, may decrease to once or twice weekly, or as needed. Wash treated area in the morning. 35 mL 11   aspirin EC 81 MG tablet Take 81 mg by  mouth at bedtime.     Calcium Carbonate (CALCIUM 600 PO) Take 600 mg by mouth.     carvedilol (COREG) 12.5 MG tablet Take 18.75 mg by mouth 2 (two) times daily with a meal.     cetirizine (ZYRTEC) 10 MG tablet Take 1 tablet (10 mg total) by mouth daily as needed for allergies. 90 tablet 3   Cholecalciferol (VITAMIN D3) 50 MCG (2000 UT) CAPS Take 2,000 capsules by mouth. FOUR TIMES A WEEK     cyclobenzaprine (FLEXERIL) 10 MG tablet Take 0.5-1 tablets (5-10 mg total) by mouth 3 (three) times daily as needed. For headache 90 tablet 3   ELIQUIS 5 MG TABS tablet Take 1 tablet (5 mg total) by mouth 2 (two)  times daily. 180 tablet 3   ezetimibe (ZETIA) 10 MG tablet TAKE 1 TABLET EVERY DAY 90 tablet 3   fluticasone (FLONASE) 50 MCG/ACT nasal spray Place 2 sprays into both nostrils daily as needed for allergies or rhinitis. 48 g 3   furosemide (LASIX) 20 MG tablet Take 2 tablets (40 mg total) by mouth daily. 180 tablet 3   isosorbide mononitrate (IMDUR) 30 MG 24 hr tablet TAKE 1/2 TABLET EVERY DAY 45 tablet 3   lisinopril (ZESTRIL) 5 MG tablet Take 0.5 tablets (2.5 mg total) by mouth daily. 90 tablet 3   magnesium gluconate (MAGONATE) 500 MG tablet Take 500 mg by mouth at bedtime.     nitroGLYCERIN (NITROSTAT) 0.4 MG SL tablet Place 1 tablet (0.4 mg total) under the tongue every 5 (five) minutes as needed for chest pain. 25 tablet 3   omeprazole (PRILOSEC) 40 MG capsule TAKE 1 CAPSULE EVERY DAY 90 capsule 1   polyethylene glycol powder (GLYCOLAX/MIRALAX) 17 GM/SCOOP powder Take 17 g by mouth as needed. 255 g 0   potassium chloride (KLOR-CON M) 10 MEQ tablet Take 1 tablet (10 mEq total) by mouth 2 (two) times daily. 180 tablet 3   promethazine (PHENERGAN) 25 MG tablet Take 0.5 tablets (12.5 mg total) by mouth every 8 (eight) hours as needed for nausea. 90 tablet 3   promethazine-dextromethorphan (PROMETHAZINE-DM) 6.25-15 MG/5ML syrup Take 5 mLs by mouth 4 (four) times daily as needed. 118 mL 0   Spacer/Aero-Holding Chambers (COMPACT SPACE CHAMBER) DEVI USE WITH INHALER 1 each 0   TART CHERRY PO Take 1 capsule by mouth 2 (two) times daily.     topiramate (TOPAMAX) 50 MG tablet Take 1 tablet (50 mg total) by mouth at bedtime. 30 tablet 1   venlafaxine XR (EFFEXOR-XR) 150 MG 24 hr capsule TAKE ONE CAPSULE BY MOUTH ONE TIME DAILY WITH BREAKFAST 90 capsule 3   venlafaxine XR (EFFEXOR-XR) 37.5 MG 24 hr capsule TAKE ONE CAPSULE BY MOUTH ONE TIME DAILY WITH BREAKFAST 90 capsule 3   benzonatate (TESSALON) 200 MG capsule Take 1 capsule (200 mg total) by mouth 3 (three) times daily as needed for cough. Swallow  whole (Patient not taking: Reported on 04/02/2024) 30 capsule 1   doxycycline (VIBRA-TABS) 100 MG tablet Take 1 tablet (100 mg total) by mouth 2 (two) times daily. (Patient not taking: Reported on 04/02/2024) 14 tablet 0   No current facility-administered medications for this visit.    Allergies:   Alendronate sodium, Alendronate sodium, Gabapentin, Ginzing [ginseng], Hydrocod poli-chlorphe poli er, Hydrocodone-acetaminophen, Imitrex [sumatriptan], Oxycodone, Peanut-containing drug products, Tramadol, Hydrocodone-guaifenesin, Iodine, Prednisone, and Tape   Social History:  The patient  reports that she quit smoking about 45 years ago. Her smoking use included cigarettes. She  started smoking about 46 years ago. She has a 1.1 pack-year smoking history. She has never used smokeless tobacco. She reports current alcohol use. She reports that she does not use drugs.   Family History:   family history includes Aneurysm in her paternal grandmother; Anxiety disorder in her mother; Arthritis in her paternal grandfather, paternal grandmother, sister, and sister; Asthma in her brother, mother, sister, and sister; Colon cancer in her maternal grandmother; Depression in her mother; Diabetes in her brother, paternal grandfather, paternal grandmother, sister, and sister; Heart disease in her mother and sister; Hypertension in her mother; Irritable bowel syndrome in her mother; Lung cancer in her maternal grandfather; Lymphoma in her maternal grandmother; Melanoma in her maternal grandfather; Migraines in her brother, maternal grandmother, sister, and sister; Nephrolithiasis in her sister; Osteoporosis in her mother and sister; Other in her brother; Other (age of onset: 47) in her father; Stroke in her maternal grandmother and mother; Thyroid disease in her mother.    Review of Systems: Review of Systems  Constitutional: Negative.   HENT: Negative.    Respiratory:  Positive for shortness of breath.   Cardiovascular:   Positive for palpitations.  Gastrointestinal: Negative.   Musculoskeletal: Negative.   Neurological: Negative.   Psychiatric/Behavioral: Negative.    All other systems reviewed and are negative.  PHYSICAL EXAM: VS:  BP 100/60 (BP Location: Left Arm, Patient Position: Sitting, Cuff Size: Normal)   Pulse (!) 114   Ht 5\' 5"  (1.651 m)   Wt 220 lb 4 oz (99.9 kg)   SpO2 96%   BMI 36.65 kg/m  , BMI Body mass index is 36.65 kg/m. Constitutional:  oriented to person, place, and time. No distress.  HENT:  Head: Grossly normal Eyes:  no discharge. No scleral icterus.  Neck: No JVD, no carotid bruits  Cardiovascular: Irregularly irregular no murmurs appreciated Pulmonary/Chest: Clear to auscultation bilaterally, no wheezes or rails Abdominal: Soft.  no distension.  no tenderness.  Musculoskeletal: Normal range of motion Neurological:  normal muscle tone. Coordination normal. No atrophy Skin: Skin warm and dry Psychiatric: normal affect, pleasant  Recent Labs: 01/18/2024: ALT 20; BUN 20; Creatinine, Ser 0.91; Hemoglobin 13.5; Platelets 257.0; Potassium 4.4; Sodium 137; TSH 0.68    Lipid Panel Lab Results  Component Value Date   CHOL 191 01/18/2024   HDL 59.30 01/18/2024   LDLCALC 112 (H) 01/18/2024   TRIG 100.0 01/18/2024    Wt Readings from Last 3 Encounters:  04/02/24 220 lb 4 oz (99.9 kg)  01/30/24 214 lb (97.1 kg)  01/19/24 206 lb (93.4 kg)    ASSESSMENT AND PLAN:  Persistent atrial fibrillation/flutter Compliant with her Eliquis, and Coreg Will recommend cardioversion this week, tentatively scheduled Wednesday, April 16  Pure hypercholesterolemia Cholesterol is at goal on the current lipid regimen. No changes to the medications were made.  Essential hypertension -  Blood pressure running low likely exacerbated by atrial flutter Recommend she hold her lisinopril and isosorbide until after the cardioversion  CORONARY ARTERY BYPASS GRAFT Chronic stable angina Stable  symptoms Stress test October 2023 no ischemia  Dissection of thoracoabdominal aorta (HCC) Followed by vascular surgery in [facility], Dr. Anner Crete on  CT July 2024  H/O dissecting abdominal aortic aneurysm repair - MRI 2020  Stable on CT July 2024  Chronic diastolic CHF echo EF 45% to 50% October 2023 Weight appears higher in the setting of atrial flutter, recommend Lasix up to 40 twice daily until abdominal distention improves, post cardioversion  Abdominal pain Symptoms improved  Morbid obesity Previously tried Ozempic but had side effects Working with weight loss clinic  Adjustment disorder Significant stress at home, husband and mother Receiving some financial support from the [place] which is helping     Orders Placed This Encounter  Procedures   EKG 12-Lead     Signed, Juanda Noon, M.D., Ph.D. 04/02/2024  [facility] [place], [place] [phone]

## 2024-04-02 NOTE — Patient Instructions (Signed)
 Medication Instructions:  Hold isosorbide and lisinopril until after cardioversion, BP low  Lasix 40 daily with extra 40 mg as needed for weight gain  If you need a refill on your cardiac medications before your next appointment, please call your pharmacy.   Lab work: No labs ordered today.    Testing/Procedures:       Dear Rachel Torres, You are scheduled for a Cardioversion on Wednesday, April 16 with Dr. Gollan.  Please arrive at the [facility] Entrance of [facility], [address], [place] [address] at 06:30 AM (This is 1 hour(s) prior to your procedure time).  Proceed to the Check-In Desk directly inside the entrance.  Procedure Parking: Use the entrance off of the [address] side of the hospital. Turn right upon entering and follow the driveway to parking that is directly in front of the Heart [facility] Vascular Center. There is no valet parking available at this entrance, however there is an awning directly in front of the Heart [facility] Vascular Center for drop off/ pick up for patients.  {TIP  Arrive 1 hour prior to procedure.  DIET:  Nothing to eat or drink after midnight except a sip of water with medications (see medication instructions below)  Continue taking your anticoagulant (blood thinner): Apixaban (Eliquis).  You will need to continue this after your procedure until you are told by your provider that it is safe to stop.    FYI:  For your safety, and to allow us  to monitor your vital signs accurately during the surgery/procedure we request: If you have artificial nails, gel coating, SNS etc, please have those removed prior to your surgery/procedure. Not having the nail coverings /polish removed may result in cancellation or delay of your surgery/procedure.  Your support person will be asked to wait in the waiting room during your procedure.  It is OK to have someone drop you off and come back when you are ready to be discharged.  You cannot drive after the  procedure and will need someone to drive you home.  Bring your insurance cards.  *Special Note: Every effort is made to have your procedure done on time. Occasionally there are emergencies that occur at the hospital that may cause delays. Please be patient if a delay does occur.    Follow-Up: At [facility], you and your health needs are our priority.  As part of our continuing mission to provide you with exceptional heart care, we have created designated Provider Care Teams.  These Care Teams include your primary Cardiologist (physician) and Advanced Practice Providers (APPs -  Physician Assistants and Nurse Practitioners) who all work together to provide you with the care you need, when you need it.  You will need a follow up appointment in 1 month  Providers on your designated Care Team:   Laneta Pintos, NP Varney Gentleman, PA-C Cadence Gennaro Khat, [place]  COVID-19 Vaccine Information can be found at: [email or web address] For questions related to vaccine distribution or appointments, please email [email or web address] or call [phone].

## 2024-04-03 ENCOUNTER — Ambulatory Visit (INDEPENDENT_AMBULATORY_CARE_PROVIDER_SITE_OTHER): Payer: Medicare HMO | Admitting: Psychology

## 2024-04-03 ENCOUNTER — Other Ambulatory Visit: Payer: Self-pay | Admitting: Cardiovascular Disease

## 2024-04-03 DIAGNOSIS — F331 Major depressive disorder, recurrent, moderate: Secondary | ICD-10-CM | POA: Diagnosis not present

## 2024-04-03 DIAGNOSIS — I483 Typical atrial flutter: Secondary | ICD-10-CM

## 2024-04-03 NOTE — Progress Notes (Signed)
 [facility] Counselor/Therapist Progress Note  Patient ID: Rachel Torres, MRN: [id number],    Date: 04/03/2024  Time Spent: 10:00am-10:55am  55 minutes   Treatment Type: Individual Therapy  Reported Symptoms: stress  Mental Status Exam: Appearance:  Casual     Behavior: Appropriate  Motor: Normal  Speech/Language:  Normal Rate  Affect: Appropriate  Mood: normal  Thought process: normal  Thought content:   WNL  Sensory/Perceptual disturbances:   WNL  Orientation: oriented to person, place, time/date, and situation  Attention: Good  Concentration: Good  Memory: WNL  Fund of knowledge:  Good  Insight:   Good  Judgment:  Good  Impulse Control: Good   Risk Assessment: Danger to Self:  No Self-injurious Behavior: No Danger to Others: No Duty to Warn:no Physical Aggression / Violence:No  Access to Firearms a concern: No  Gang Involvement:No   Subjective: Pt present for face-to-face individual therapy via video.  Pt consents to telehealth video session and is aware of limitations and benefits of virtual sessions.   Location of pt: home Location of therapist: home office.   Pt talked about having to go to the ER a few days ago bc she was in afib.   Pt saw her cardiologist also and has to have a heart ablation tomorrow.   Addressed pt's health concerns and worries.  Pt does not feel well but still has to take care of [name].   Pt and Fayrene Fearing will take [name] to the [place] today bc he fell and has an injury.   This is very stressful for pt.  Addressed the stress of caregiving for [name].   Worked on Optician, dispensing. Pt talked about her great niece who is in the psychiatric hospital bc she tried to commit suicide last week.   Addressed pt's concerns about her niece.   Worked on self care strategies. Provided supportive therapy.    Interventions: Cognitive Behavioral Therapy and Insight-Oriented  Diagnosis:  F33.1   Plan of Care: Recommend ongoing therapy.   Pt  participated in setting treatment goals.   Pt needs a place to talk about stressors.  Plan to meet every week.  Pt agrees with treatment plan.    Treatment Plan (target date: 03/27/2025) Client Abilities/Strengths  Pt is bright, engaging, and motivated for therapy.   Client Treatment Preferences  Individual therapy.  Client Statement of Needs  Improve coping skills.  Have a place to talk about stressors.   Symptoms  Depressed or irritable mood. Unresolved grief issues.  Problems Addressed  Unipolar Depression Goals 1. Alleviate depressive symptoms and return to previous level of effective functioning. 2. Appropriately grieve the loss in order to normalize mood and to return to previously adaptive level of functioning. Objective Learn and implement behavioral strategies to overcome depression. Target Date: 2025-03-27 Frequency: weekly  Progress: 45 Modality: individual  Related Interventions Engage the client in "behavioral activation," increasing his/her activity level and contact with sources of reward, while identifying processes that inhibit activation.  Use behavioral techniques such as instruction, rehearsal, role-playing, role reversal, as needed, to facilitate activity in the client's daily life; reinforce success. Assist the client in developing skills that increase the likelihood of deriving pleasure from behavioral activation (e.g., assertiveness skills, developing an exercise plan, less internal/more external focus, increased social involvement); reinforce success. Objective Identify important people in life, past and present, and describe the quality, good and poor, of those relationships. Target Date: 2025-03-27 Frequency: weekly  Progress: 45 Modality: individual  Related Interventions  Conduct Interpersonal Therapy beginning with the assessment of the client's "interpersonal inventory" of important past and present relationships; develop a case formulation linking depression  to grief, interpersonal role disputes, role transitions, and/or interpersonal deficits). Objective Learn and implement problem-solving and decision-making skills. Target Date: 2025-03-27 Frequency: weekly  Progress: 45 Modality: individual  Related Interventions Conduct Problem-Solving Therapy using techniques such as psychoeducation, modeling, and role-playing to teach client problem-solving skills (i.e., defining a problem specifically, generating possible solutions, evaluating the pros and cons of each solution, selecting and implementing a plan of action, evaluating the efficacy of the plan, accepting or revising the plan); role-play application of the problem-solving skill to a real life issue. Encourage in the client the development of a positive problem orientation in which problems and solving them are viewed as a natural part of life and not something to be feared, despaired, or avoided. 3. Develop healthy interpersonal relationships that lead to the alleviation and help prevent the relapse of depression. 4. Develop healthy thinking patterns and beliefs about self, others, and the world that lead to the alleviation and help prevent the relapse of depression. 5. Recognize, accept, and cope with feelings of depression. Diagnosis F33.1  Conditions For Discharge Achievement of treatment goals and objectives   Willey Harrier, LCSW

## 2024-04-04 ENCOUNTER — Ambulatory Visit
Admission: RE | Admit: 2024-04-04 | Discharge: 2024-04-04 | Disposition: A | Discharge status: Home / Self Care | Source: Ambulatory Visit | Attending: Cardiovascular Disease | Admitting: Cardiovascular Disease

## 2024-04-04 ENCOUNTER — Encounter: Payer: Self-pay | Admitting: Cardiovascular Disease

## 2024-04-04 ENCOUNTER — Ambulatory Visit: Admitting: Anesthesiology

## 2024-04-04 ENCOUNTER — Encounter
Admission: RE | Disposition: A | Discharge status: Home / Self Care | Payer: Self-pay | Source: Ambulatory Visit | Attending: Cardiovascular Disease

## 2024-04-04 DIAGNOSIS — Z87891 Personal history of nicotine dependence: Secondary | ICD-10-CM | POA: Insufficient documentation

## 2024-04-04 DIAGNOSIS — F32A Depression, unspecified: Secondary | ICD-10-CM | POA: Diagnosis not present

## 2024-04-04 DIAGNOSIS — Z9049 Acquired absence of other specified parts of digestive tract: Secondary | ICD-10-CM | POA: Insufficient documentation

## 2024-04-04 DIAGNOSIS — I2510 Atherosclerotic heart disease of native coronary artery without angina pectoris: Secondary | ICD-10-CM | POA: Diagnosis not present

## 2024-04-04 DIAGNOSIS — I483 Typical atrial flutter: Secondary | ICD-10-CM

## 2024-04-04 DIAGNOSIS — Z79899 Other long term (current) drug therapy: Secondary | ICD-10-CM | POA: Insufficient documentation

## 2024-04-04 DIAGNOSIS — G4733 Obstructive sleep apnea (adult) (pediatric): Secondary | ICD-10-CM | POA: Diagnosis not present

## 2024-04-04 DIAGNOSIS — K219 Gastro-esophageal reflux disease without esophagitis: Secondary | ICD-10-CM | POA: Diagnosis not present

## 2024-04-04 DIAGNOSIS — Z951 Presence of aortocoronary bypass graft: Secondary | ICD-10-CM | POA: Insufficient documentation

## 2024-04-04 DIAGNOSIS — F419 Anxiety disorder, unspecified: Secondary | ICD-10-CM | POA: Insufficient documentation

## 2024-04-04 DIAGNOSIS — I110 Hypertensive heart disease with heart failure: Secondary | ICD-10-CM | POA: Insufficient documentation

## 2024-04-04 DIAGNOSIS — I4891 Unspecified atrial fibrillation: Secondary | ICD-10-CM | POA: Insufficient documentation

## 2024-04-04 DIAGNOSIS — I5032 Chronic diastolic (congestive) heart failure: Secondary | ICD-10-CM | POA: Insufficient documentation

## 2024-04-04 DIAGNOSIS — I509 Heart failure, unspecified: Secondary | ICD-10-CM | POA: Diagnosis not present

## 2024-04-04 DIAGNOSIS — R0602 Shortness of breath: Secondary | ICD-10-CM | POA: Diagnosis present

## 2024-04-04 HISTORY — PX: CARDIOVERSION: SHX1299

## 2024-04-04 SURGERY — CARDIOVERSION
Anesthesia: General

## 2024-04-04 MED ORDER — SODIUM CHLORIDE 0.9 % IV SOLN
INTRAVENOUS | Status: DC
Start: 2024-04-04 — End: 2024-04-04

## 2024-04-04 MED ORDER — PROPOFOL 10 MG/ML IV BOLUS
INTRAVENOUS | Status: DC | PRN
  Administered 2024-04-04: 70 mg via INTRAVENOUS
  Administered 2024-04-04: 20 mg via INTRAVENOUS

## 2024-04-04 MED ORDER — PROPOFOL 10 MG/ML IV BOLUS
INTRAVENOUS | Status: AC
  Filled 2024-04-04: qty 40

## 2024-04-04 NOTE — CV Procedure (Signed)
Cardioversion procedure note For atrial flutter, typical  Procedure Details:  Consent: Risks of procedure as well as the alternatives and risks of each were explained to the (patient/caregiver). Consent for procedure obtained.  Time Out: Verified patient identification, verified procedure, site/side was marked, verified correct patient position, special equipment/implants available, medications/allergies/relevent history reviewed, required imaging and test results available. Performed  Patient placed on cardiac monitor, pulse oximetry, supplemental oxygen as necessary.  Sedation given: propofol IV, Dr. Adams Pacer pads placed anterior and posterior chest.   Cardioverted 1 time(s).  Cardioverted at  150 J. Synchronized biphasic Converted to NSR   Evaluation: Findings: Post procedure EKG shows: NSR Complications: None Patient did tolerate procedure well.  Time Spent Directly with the Patient:  45 minutes   Rachel Torres, M.D., Ph.D.  

## 2024-04-04 NOTE — Transfer of Care (Signed)
 Immediate Anesthesia Transfer of Care Note  Patient: Rachel Torres  Procedure(s) Performed: CARDIOVERSION  Patient Location: Short Stay  Anesthesia Type:General  Level of Consciousness: drowsy  Airway & Oxygen Therapy: Patient Spontanous Breathing and Patient connected to nasal cannula oxygen  Post-op Assessment: Report given to RN and Post -op Vital signs reviewed and stable  Post vital signs: Reviewed and stable  Last Vitals:  Vitals Value Taken Time  BP 95/64 04/04/24 0752  Temp    Pulse 87   Resp 11   SpO2 95     Last Pain:  Vitals:   04/04/24 0719  TempSrc: Oral  PainSc: 3          Complications: No notable events documented.

## 2024-04-04 NOTE — Anesthesia Preprocedure Evaluation (Signed)
 Anesthesia Evaluation  Patient identified by MRN, date of birth, ID band Patient awake    Reviewed: Allergy & Precautions, H&P , NPO status , Patient's Chart, lab work & pertinent test results, reviewed documented beta blocker date and time   Airway Mallampati: II   Neck ROM: full    Dental  (+) Poor Dentition   Pulmonary shortness of breath and with exertion, asthma , sleep apnea , pneumonia, former smoker   Pulmonary exam normal        Cardiovascular Exercise Tolerance: Poor hypertension, On Medications + CAD, + CABG and +CHF  (-) Orthopnea Atrial Fibrillation  Rhythm:regular Rate:Normal     Neuro/Psych  Headaches PSYCHIATRIC DISORDERS Anxiety Depression       GI/Hepatic Neg liver ROS,GERD  Medicated,,  Endo/Other  negative endocrine ROS    Renal/GU Renal disease  negative genitourinary   Musculoskeletal   Abdominal   Peds  Hematology  (+) Blood dyscrasia, anemia   Anesthesia Other Findings Past Medical History: No date: AAA (abdominal aortic aneurysm) (HCC)     Comment:  a. Chronic w/o evidence of aneurysmal dil on CTA 01/2016. No date: Alcohol abuse No date: Allergy No date: Anemia No date: Anxiety No date: Asthma No date: CAD (coronary artery disease)     Comment:  a. 08/2010 s/p CABG x 1 (VG->RCA) @ time of Ao dissection              repair; b. 01/2016 Lexiscan MV: mid antsept/apical defect               w/ ? peri-infarct ischemia-->likely attenuation-->Med Rx. No date: Chewing difficulty No date: Chronic combined systolic (congestive) and diastolic  (congestive) heart failure (HCC)     Comment:  a. 2012 EF 30-35%; b. 04/2015 EF 40-45%; c. 01/2016 Echo:               Ef 55-65%, nl AoV bioprosthesis, nl RV, nl PASP. No date: Constipation No date: Depression No date: Edema of both lower extremities No date: Gallbladder sludge No date: GERD (gastroesophageal reflux disease) No date: Heart failure (HCC) No  date: Heart valve problem No date: History of Bicuspid Aortic Valve     Comment:  a. 08/2010 s/p AVR @ time of Ao dissection repair; b.               01/2016 Echo: Ef 55-65%, nl AoV bioprosthesis, nl RV, nl               PASP. No date: Hx of repair of dissecting thoracic aortic aneurysm,  Stanford type A     Comment:  a. 08/2010 s/p repair with AVR and VG->RCA; b. 01/2016               CTA: stable appearance of Asc Thoracic Aortic graft.               Opacification of flase lumen of chronic abd Ao dissection              w/ retrograde flow through lumbar arteries. No aneurysmal              dil of flase lumen. No date: Hyperlipidemia No date: Hypertension No date: Hypertensive heart disease No date: IBS (irritable bowel syndrome) No date: Internal hemorrhoids No date: Kidney problem No date: Marfan syndrome     Comment:  pt denies No date: Migraine No date: Obstructive sleep apnea No date: Osteoarthritis No date: Osteoporosis No date: Palpitations No date: Pneumonia No  date: RA (rheumatoid arthritis) (HCC)     Comment:  a. Followed by Dr. Chaim Colony Past Surgical History: No date: ABDOMINAL AORTIC ANEURYSM REPAIR 2001: CARDIAC CATHETERIZATION 1986 & 1991: CESAREAN SECTION No date: CHOLECYSTECTOMY No date: COLONOSCOPY 01/19/2024: COLONOSCOPY WITH PROPOFOL; N/A     Comment:  Procedure: COLONOSCOPY WITH PROPOFOL;  Surgeon: Luke Salaam, MD;  Location: [facility] ENDOSCOPY;  Service:               Gastroenterology;  Laterality: N/A; 05/08/2020: ESOPHAGOGASTRODUODENOSCOPY (EGD) WITH PROPOFOL; N/A     Comment:  Procedure: ESOPHAGOGASTRODUODENOSCOPY (EGD) WITH               PROPOFOL;  Surgeon: Irby Mannan, MD;  Location:               [facility] ENDOSCOPY;  Service: Endoscopy;  Laterality: N/A; No date: FRACTURE SURGERY; Right     Comment:  shoulder replacement No date: HEMORRHOID BANDING No date: ORIF DISTAL RADIUS FRACTURE; Left 01/19/2024: POLYPECTOMY     Comment:   Procedure: POLYPECTOMY;  Surgeon: Luke Salaam, MD;                Location: [facility] ENDOSCOPY;  Service: Gastroenterology;; No date: UPPER GASTROINTESTINAL ENDOSCOPY   Reproductive/Obstetrics negative OB ROS                             Anesthesia Physical Anesthesia Plan  ASA: 4  Anesthesia Plan: General   Post-op Pain Management:    Induction:   PONV Risk Score and Plan:   Airway Management Planned:   Additional Equipment:   Intra-op Plan:   Post-operative Plan:   Informed Consent: I have reviewed the patients History and Physical, chart, labs and discussed the procedure including the risks, benefits and alternatives for the proposed anesthesia with the patient or authorized representative who has indicated his/her understanding and acceptance.     Dental Advisory Given  Plan Discussed with: CRNA  Anesthesia Plan Comments:        Anesthesia Quick Evaluation

## 2024-04-05 ENCOUNTER — Encounter: Payer: Self-pay | Admitting: Cardiovascular Disease

## 2024-04-05 ENCOUNTER — Ambulatory Visit: Payer: Medicare HMO

## 2024-04-06 NOTE — Interval H&P Note (Signed)
 History and Physical Interval Note:  04/06/2024 8:28 AM  Rachel Torres  has presented today for surgery, with the diagnosis of Cardioversion   Afib.  The various methods of treatment have been discussed with the patient and family. After consideration of risks, benefits and other options for treatment, the patient has consented to  Procedure(s): CARDIOVERSION (N/A) as a surgical intervention.  The patient's history has been reviewed, patient examined, no change in status, stable for surgery.  I have reviewed the patient's chart and labs.  Questions were answered to the patient's satisfaction.     Joanny Dupree

## 2024-04-06 NOTE — H&P (Signed)
 H&P Addendum, pre-cardioversion  Patient was seen and evaluated prior to -cardioversion procedure Symptoms, prior testing details again confirmed with the patient Patient examined, no significant change from prior exam Lab work reviewed in detail personally by myself Patient understands risk and benefit of the procedure,  The risks (stroke, cardiac arrhythmias rarely resulting in the need for a temporary or permanent pacemaker, skin irritation or burns and complications associated with conscious sedation including aspiration, arrhythmia, respiratory failure and death), benefits (restoration of normal sinus rhythm) and alternatives of a direct current cardioversion were explained in detail Patient willing to proceed.  Signed, Dossie Arbour, MD, Ph.D [facility]

## 2024-04-09 ENCOUNTER — Ambulatory Visit: Payer: Medicare HMO

## 2024-04-10 ENCOUNTER — Ambulatory Visit: Payer: Medicare HMO | Admitting: Psychology

## 2024-04-10 DIAGNOSIS — F331 Major depressive disorder, recurrent, moderate: Secondary | ICD-10-CM

## 2024-04-11 NOTE — Anesthesia Postprocedure Evaluation (Signed)
 Anesthesia Post Note  Patient: Rachel Torres  Procedure(s) Performed: CARDIOVERSION  Patient location during evaluation: PACU Anesthesia Type: General Level of consciousness: awake and alert Pain management: pain level controlled Vital Signs Assessment: post-procedure vital signs reviewed and stable Respiratory status: spontaneous breathing, nonlabored ventilation, respiratory function stable and patient connected to nasal cannula oxygen  Cardiovascular status: blood pressure returned to baseline and stable Postop Assessment: no apparent nausea or vomiting Anesthetic complications: no   No notable events documented.   Last Vitals:  Vitals:   04/04/24 0815 04/04/24 0830  BP: (!) 93/59 (!) 107/56  Pulse: 84 83  Resp:    Temp:  (!) 36.4 C  SpO2: 98% 97%    Last Pain:  Vitals:   04/04/24 0830  TempSrc: Oral  PainSc: 0-No pain                 Zula Hitch

## 2024-04-12 ENCOUNTER — Ambulatory Visit: Payer: Medicare HMO

## 2024-04-12 NOTE — Therapy (Signed)
 OUTPATIENT PHYSICAL THERAPY TREATMENT/ Physical Therapy Progress Note   Dates of reporting period  02/16/24   to   04/16/24        Patient Name: Rachel Torres MRN: [id number] DOB:April 04, 1956, 68 y.o., female Today's Date: 04/16/2024  END OF SESSION:  PT End of Session - 04/16/24 1147     Visit Number 30    Number of Visits 44    Date for PT Re-Evaluation 05/17/24    Authorization Type [employer]  Medicare HMO    Authorization Time Period 01/26/24-03/22/24    Progress Note Due on Visit 10    PT Start Time 1145    PT Stop Time 1228    PT Time Calculation (min) 43 min    Activity Tolerance Patient tolerated treatment well;No increased pain    Behavior During Therapy WFL for tasks assessed/performed                  Past Medical History:  Diagnosis Date   AAA (abdominal aortic aneurysm) (HCC)    a. Chronic w/o evidence of aneurysmal dil on CTA 01/2016.   Alcohol abuse    Allergy    Anemia    Anxiety    Asthma    CAD (coronary artery disease)    a. 08/2010 s/p CABG x 1 (VG->RCA) @ time of Ao dissection repair; b. 01/2016 Lexiscan  MV: mid antsept/apical defect w/ ? peri-infarct ischemia-->likely attenuation-->Med Rx.   Chewing difficulty    Chronic combined systolic (congestive) and diastolic (congestive) heart failure (HCC)    a. 2012 EF 30-35%; b. 04/2015 EF 40-45%; c. 01/2016 Echo: Ef 55-65%, nl AoV bioprosthesis, nl RV, nl PASP.   Constipation    Depression    Edema of both lower extremities    Gallbladder sludge    GERD (gastroesophageal reflux disease)    Heart failure (HCC)    Heart valve problem    History of Bicuspid Aortic Valve    a. 08/2010 s/p AVR @ time of Ao dissection repair; b. 01/2016 Echo: Ef 55-65%, nl AoV bioprosthesis, nl RV, nl PASP.   Hx of repair of dissecting thoracic aortic aneurysm, Stanford type A    a. 08/2010 s/p repair with AVR and VG->RCA; b. 01/2016 CTA: stable appearance of Asc Thoracic Aortic graft. Opacification of flase lumen of chronic  abd Ao dissection w/ retrograde flow through lumbar arteries. No aneurysmal dil of flase lumen.   Hyperlipidemia    Hypertension    Hypertensive heart disease    IBS (irritable bowel syndrome)    Internal hemorrhoids    Kidney problem    Marfan syndrome    pt denies   Migraine    Obstructive sleep apnea    Osteoarthritis    Osteoporosis    Palpitations    Pneumonia    RA (rheumatoid arthritis) (HCC)    a. Followed by Dr. Chaim Colony   Past Surgical History:  Procedure Laterality Date   ABDOMINAL AORTIC ANEURYSM REPAIR     CARDIAC CATHETERIZATION  2001   CARDIOVERSION N/A 04/04/2024   Procedure: CARDIOVERSION;  Surgeon: Devorah Fonder, MD;  Location: [facility] ORS;  Service: Cardiovascular;  Laterality: N/A;   CESAREAN SECTION  1986 & 1991   CHOLECYSTECTOMY     COLONOSCOPY     COLONOSCOPY WITH PROPOFOL  N/A 01/19/2024   Procedure: COLONOSCOPY WITH PROPOFOL ;  Surgeon: Luke Salaam, MD;  Location: [facility] ENDOSCOPY;  Service: Gastroenterology;  Laterality: N/A;   ESOPHAGOGASTRODUODENOSCOPY (EGD) WITH PROPOFOL  N/A 05/08/2020   Procedure: ESOPHAGOGASTRODUODENOSCOPY (EGD)  WITH PROPOFOL ;  Surgeon: Irby Mannan, MD;  Location: [facility] ENDOSCOPY;  Service: Endoscopy;  Laterality: N/A;   FRACTURE SURGERY Right    shoulder replacement   HEMORRHOID BANDING     ORIF DISTAL RADIUS FRACTURE Left    POLYPECTOMY  01/19/2024   Procedure: POLYPECTOMY;  Surgeon: Luke Salaam, MD;  Location: [facility] ENDOSCOPY;  Service: Gastroenterology;;   UPPER GASTROINTESTINAL ENDOSCOPY     Patient Active Problem List   Diagnosis Date Noted   Typical atrial flutter (HCC) 04/04/2024   Adenomatous polyp of colon 01/19/2024   Heart failure with improved ejection fraction (HFimpEF) (HCC) 01/18/2024   Abnormal metabolism 11/14/2023   Weight regain after inital weight loss 11/14/2023   Multiple closed tarsal fractures of left foot, sequela 07/12/2023   Arm contusion, right, sequela 07/12/2023   Lumbar disc disease  07/12/2023   PAF (paroxysmal atrial fibrillation) (HCC) 06/09/2023   Paresthesia 06/03/2023   Obesity, Beginning BMI 35.45 02/17/2023   Tinnitus 01/14/2023   Change in vision 01/14/2023   Encounter for screening colonoscopy 01/14/2023   Personal history of fall 01/14/2023   Problem related to unspecified psychosocial circumstances 12/28/2022   Person encountering health services to consult on behalf of another person 12/28/2022   Palpitation 12/10/2022   Fatigue 10/26/2022   B12 deficiency 10/26/2022   Right shoulder pain 09/15/2022   Depression 09/14/2022   Other constipation 05/12/2022   Medicare annual wellness visit, initial 07/17/2021   TMJ (dislocation of temporomandibular joint) 07/17/2021   Encounter for screening mammogram for breast cancer 06/05/2021   Diarrhea 04/11/2020   Epigastric pain 02/19/2020   Dark stools 02/19/2020   History of atrial fibrillation 02/05/2019   SOB (shortness of breath) 01/27/2019   Irregular surface of cornea 12/25/2018   Acute cough 09/08/2017   History of HPV infection 12/01/2016   Screening mammogram, encounter for 11/29/2016   Estrogen deficiency 11/29/2016   Encounter for routine gynecological examination 11/29/2016   Grade III hemorrhoids 10/06/2016   Tachycardia 08/14/2016   Hemorrhoids 08/13/2016   Atypical migraine 08/03/2016   CAD (coronary artery disease)    AAA (abdominal aortic aneurysm) (HCC)    Chronic combined systolic (congestive) and diastolic (congestive) heart failure (HCC)    Prediabetes 02/11/2016   Generalized anxiety disorder 12/08/2015   Panic attacks 07/28/2015   Chest pain 07/09/2013   Sensorineural hearing loss 03/27/2013   Vitamin D  deficiency 07/03/2012   H/O aortic dissection 05/12/2012   Routine general medical examination at a health care facility 09/20/2011   Atypical chest pain 09/15/2011   S/P AVR (aortic valve replacement) 01/07/2011   CORONARY ARTERY BYPASS GRAFT, HX OF 01/07/2011   Arthritis  12/20/2010   Back pain 12/15/2010   H/O dissecting abdominal aortic aneurysm repair 08/24/2010   IRRITABLE BOWEL SYNDROME 09/09/2009   Obstructive sleep apnea 08/04/2009   External hemorrhoid 06/26/2009   Osteoporosis 01/09/2009   Hyperlipidemia 07/26/2007   Depression with anxiety 07/26/2007   Essential hypertension 07/26/2007   Allergic rhinitis 07/26/2007   Asthma 07/26/2007   GERD 07/26/2007   Osteoarthritis 07/26/2007   Ocular migraine 07/26/2007   Mild intermittent asthma without complication 07/26/2007    PCP: Clemens Curt MD   REFERRING PROVIDER: Bert Britain  REFERRING DIAG: L rotator cuff   THERAPY DIAG:  Other abnormalities of gait and mobility  Abnormal posture  Other low back pain  Stiffness of left shoulder, not elsewhere classified  Rationale for Evaluation and Treatment: Rehabilitation  ONSET DATE: Feb 2024  SUBJECTIVE:  SUBJECTIVE STATEMENT:   Patient underwent a cardioversion Wednesday. Sees cardiology tomorrow, getting out of breath quickly. Went to ED prior to Wednesday for A fib.   Hand dominance: Left  PERTINENT HISTORY: Brentlee Sciara is a 66yoF familiar to our services. Pt referred to PT for strengthening of chronic left shoulder DJD, known remote RC injury. Recently fell and broke four metatarsals, since cleared from use of CAM rocker. She was treated successfully in past for right shoulder pain and has past medical history of Right shoulder surgery replacement. 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 PMH aorta type A dissection in sept 2011 s/p aortic valve replacement, bypass of R coronary artery, anemia, anxiety, CAD, CHF, depression, GERD, Heart failure, HLD, HTN, IBS, migraine, osteoarthritis, osteoporosis, RA, back pain.    Back pain:  Patient has  new referral for radiculopathy, lumbar region. MRI showed degeneration of L1-2 and L2-3 with some disc space collapse and minor retrolisthesis of L2-3. Issue began in February, felt numbness in L thigh. No cause known, but she was at a museum and noticed it was numb. Sometimes L foot numbness and toe numbness of 2nd and 3rd toe causing tripping. This caused her to trip July 4th and break five metatarsals. Some minor pain in the low pain noted. Pain noted to be 2-3/10 Pain in hip 3-4/10 of L hip.    PAIN:  Are you having pain? Yes, 3/10 pain in lateral deltoids in the shoulder.  PRECAUTIONS: None  FALLS:  Has patient fallen in last 6 months? Yes. Number of falls 2  PATIENT GOALS:to lift more than 3lb with LUE, be able to eat a meal without arm being tired.    OBJECTIVE:  DIAGNOSTIC FINDINGS:  IMPRESSION: Mild degenerative change in the cervical spine without acute fracture or traumatic subluxation.   IMPRESSION: Full width, full width tears of the supraspinatus and infraspinatus tendons with retraction to the glenoid. Grade 2/3 infraspinatus atrophy.   Moderate tendinosis of the distal subscapularis tendon with articular sided fraying of the cephalad fibers.   Moderate intra-articular long head biceps tendinosis with fraying and possible partial tearing.   Mild glenohumeral and moderate AC joint osteoarthritis.   Degenerative superior labral tearing anteriorly and posteriorly.   PATIENT SURVEYS:  Quick Dash 50%  and FOTO 46%   COGNITION: Overall cognitive status: Within functional limits for tasks assessed                                  SENSATION: WFL   POSTURE: Slight rounded shoulders and shoulder elevation    UPPER EXTREMITY ROM:    Active ROM Right eval 9/16: recert RUE Left eval 9/16 RECERT LUE  Shoulder flexion 64 122 91 92  Shoulder extension        Shoulder abduction 95 88 52 82  Shoulder adduction        Shoulder internal rotation Unable to test at  90 61 degrees 5 40  Shoulder external rotation 18at side of body -5 degrees at 90 degrees 34 35  Elbow flexion [facility]  WFL   Elbow extension [facility]  WFL   Wrist flexion        Wrist extension        Wrist ulnar deviation        Wrist radial deviation        Wrist pronation        Wrist supination        (  Blank rows = not tested)   UPPER EXTREMITY MMT:   MMT Right eval Left eval  Shoulder flexion      Shoulder extension      Shoulder abduction      Shoulder adduction      Shoulder internal rotation      Shoulder external rotation      Middle trapezius      Lower trapezius      Elbow flexion      Elbow extension      Wrist flexion      Wrist extension      Wrist ulnar deviation      Wrist radial deviation      Wrist pronation      Wrist supination      Grip strength (lbs) 30 35  (Blank rows = not tested) Unable to test strength due to pain with motion   TRUNK ROM: Trunk Flexion 65  Trunk Extension 7  Trunk R SB Limited 25%  Trunk L SB WFL  Trunk R rotation WFL  Trunk L rotation Limited 25%    Ankle ROM Seated: Motion: AROM seated Right Left  Plantarflexion 4+ 3  Dorsiflexion 4+ 4  Inversion 4 3+  Eversion 4+ 3+    SHOULDER SPECIAL TESTS: Impingement tests:  unable to test due to pain in starting position SLAP lesions:  unable to test due to pain in starting position Instability tests:  unable to test due to pain in starting position Rotator cuff assessment:  unable to test due to pain in starting position Biceps assessment:  unable to test due to pain in starting position   LOW BACK SPECIAL TEST: SLR: negative  Scour test: negative FABER: negative Pelvic compression/distraction: negative  Slump test positive LLE  6 min walk test: 1160 ft increased left foot drag and antalgic gait pattern with foot eversion    JOINT MOBILITY TESTING:  AP: painful PA: painful Inferior: painful Distraction: reduces pain   Back mobilization hypomobile and painful to  grade II mobilizations    PALPATION:  Tenderness to all joint regions of GH     TODAY'S TREATMENT: DATE: 04/16/24   HR 79  Manual:   Grade II thoracic and lumbar mobilizations  TherEx Supine: -bring leg on top of other leg into figure four position 10x;  -hamstring stretch 60 seconds each LE -single knee to chest 60 seconds each LE -figure four stretch 60 seconds each LE  -nerve glide 25x each LE -posterior pelvic tilt 10x -BTB abduction 15x; x 2 sets -BTB march 15xx 2 sets -scapular retraction 15x   Sidelying: -thoracic rotation 10x10 second holds   dynadisc -A/P 15x -med/lat 15x -circles clockwise 15x, counterclockwise 15x   Seated: Alphabet each LE    PATIENT EDUCATION: Education details: goals, POC  Person educated: Patient Education method: Explanation, Demonstration, Tactile cues, Verbal cues, and Handouts Education comprehension: verbalized understanding, returned demonstration, verbal cues required, and tactile cues required  HOME EXERCISE PROGRAM:  Access Code: [email or web address] URL: [email or web address] Date: 10/03/2023 Prepared by: Jissell Trafton  Exercises - Seated Scapular Retraction  - 1 x daily - 7 x weekly - 2 sets - 10 reps - 5 hold - Neck Sidebending  - 1 x daily - 7 x weekly - 2 sets - 2 reps - 30 hold - Supine Isometric Shoulder Extension with Towel  - 1 x daily - 7 x weekly - 2 sets - 10 reps - 5 hold - Supine Isometric Shoulder Adduction with Towel Roll  -  1 x daily - 7 x weekly - 2 sets - 10 reps - 5 hold - Seated Bilateral Shoulder Flexion Towel Slide at Table Top  - 1 x daily - 7 x weekly - 1 sets - 3 reps - 30 hold - Supine Posterior Pelvic Tilt  - 1 x daily - 7 x weekly - 2 sets - 5 reps - 30 hold - Supine LE Neural Mobilization  - 1 x daily - 7 x weekly - 2 sets - 10 reps - 5 hold - Seated Ankle Alphabet  - 1 x daily - 7 x weekly - 2 sets - 1 reps - 5 hold  ASSESSMENT:  CLINICAL IMPRESSION:    Patient's condition has  the potential to improve in response to therapy. Maximum improvement is yet to be obtained. The anticipated improvement is attainable and reasonable in a generally predictable time. Goals performed 03/22/24, please refer to that note for further details.  Monitoring of patients vitals maintained throughout session due to patient's recent A fib.  Pt will continue to benefit from skilled therapy to address remaining deficits in order to improve overall QoL and return to PLOF.     OBJECTIVE IMPAIRMENTS: cardiopulmonary status limiting activity, decreased activity tolerance, decreased mobility, increased fascial restrictions, impaired perceived functional ability, increased muscle spasms, impaired flexibility, impaired UE functional use, improper body mechanics, postural dysfunction, and pain.   ACTIVITY LIMITATIONS: carrying, lifting, bed mobility, bathing, dressing, self feeding, reach over head, and caring for others  PARTICIPATION LIMITATIONS: meal prep, cleaning, laundry, personal finances, interpersonal relationship, driving, shopping, community activity, yard work, church, and caregiving for husband  PERSONAL FACTORS: Age, Fitness, Past/current experiences, Time since onset of injury/illness/exacerbation, and 3+ comorbidities: aorta type A dissection in sept 2011 with aortic valve replacement bypass of R coronary artery, AAA, anemia, anxiety, CAD, CHF, depression, GERD, Heart failure, HLD, HTN, IBS, Kidney problems, migraine, osteoarthritis, osteoporosis, RA, ocular migraines, back pain  are also affecting patient's functional outcome.   REHAB POTENTIAL: Fair    CLINICAL DECISION MAKING: Evolving/moderate complexity  EVALUATION COMPLEXITY: Moderate   GOALS: Goals reviewed with patient? Yes  SHORT TERM GOALS: Target date: 04/19/2024    Patient will be independent in home exercise program to improve strength/mobility for better functional independence with ADLs. Baseline:7/23: HEP given 9/16:  compliant 1x/week 4/3: improving compliance Goal status: On going     LONG TERM GOALS: Target date: 05/17/2024    Patient will increase FOTO score to equal to or greater than  57%   to demonstrate statistically significant improvement in mobility and quality of life.  Baseline: 7/22: 46 9/16: 50% 10/7: 40% 12/10: 57% Goal status: MET  2.  Patient will improve shoulder AROM to > 140 degrees of flexion, scaption, and abduction for improved ability to perform overhead activities. Baseline: 7/22: see above 9/16: see above 10/7 : defer to exacerbation 12/10: shoulder abduction >140, flexion < 140 , assess scaption next visit 2/6: flexion >140, abduction >140 scaption >140 Goal status: MET  3.  Patient will demonstrate adequate shoulder ROM and strength to be able to shave and dress independently with pain less than 3/10. Baseline: 7/23: 6/10 pain with dressing, limited ROM, unable to test strength 9/16: 3/10 pain with dressing  Goal status: MET  4.  Patient will decrease Quick DASH score by > 8 points demonstrating reduced self-reported upper extremity disability for L and R shoulder  Baseline: 7/23: L shoulder 50% 9/16: R=54.54 % L 66% L 81% R 24% 12/10: R:  43.2% L: 56.8% 2/6: L 22%  R 34%  Goal status: MET  5.  Patient will be able to eat an entire meal without resting arm demonstrating improved functional strength Baseline: 7/23: has to rest while eating due to pain. 9/16: able to make halfway through meal 10/7: can make it about halfway through meal, some meals can get 3/4s through meal 12/10: improved, still has to rest every once in a while 2/6: can get halfway to 2/3rds of the meal.  Goal status: deferred at this time to focus on back/leg  6.  Patient will be able to stand 15 minutes without her LLE going numb allowing for ADLS such as showering and iADLs such as cooking Baseline: 5 minutes 12/10: will assess next visit 2/6: 10 minutes 4/3: 10 minutes Goal status: IN PROGRESS  7.  Patient will increase six minute walk test distance to >1500 for progression to community ambulator and improve gait ability Baseline: 1160 ft  12/10: 1010 ft 2/6: 1140 ft  4/3: 1201 ft  Goal status: IN PROGRESS  8  Patient will reduce modified Oswestry score to <20 as to demonstrate minimal disability with ADLs including improved sleeping tolerance, walking/sitting tolerance etc for better mobility with ADLs.  Baseline: 2/6: 60% 4/3: 50%  Goal status: In progress  PLAN:  PT FREQUENCY: 2x/week  PT DURATION: 8 weeks  PLANNED INTERVENTIONS: Therapeutic exercises, Therapeutic activity, Neuromuscular re-education, Balance training, Gait training, Patient/Family education, Self Care, Joint mobilization, Vestibular training, Canalith repositioning, Visual/preceptual remediation/compensation, Dry Needling, Electrical stimulation, Spinal mobilization, Cryotherapy, Moist heat, Compression bandaging, scar mobilization, Splintting, Taping, Traction, Ultrasound, Biofeedback, Ionotophoresis 4mg /ml Dexamethasone , Manual therapy, and Re-evaluation  PLAN FOR NEXT SESSION:  Continue with LUE strengthening  L ankle strengthening, distraction, core activation, reassess scaption and flexion ROM, make HEP    Jakyle Petrucelli  Brain Cahill, PT, DPT Physical Therapist - [facility]  Outpatient Physical Therapy- [facility] [phone]    04/16/24, 12:33 PM

## 2024-04-16 ENCOUNTER — Ambulatory Visit: Payer: Medicare HMO

## 2024-04-16 DIAGNOSIS — R2689 Other abnormalities of gait and mobility: Secondary | ICD-10-CM

## 2024-04-16 DIAGNOSIS — M5459 Other low back pain: Secondary | ICD-10-CM

## 2024-04-16 DIAGNOSIS — M25612 Stiffness of left shoulder, not elsewhere classified: Secondary | ICD-10-CM | POA: Diagnosis not present

## 2024-04-16 DIAGNOSIS — R293 Abnormal posture: Secondary | ICD-10-CM

## 2024-04-17 ENCOUNTER — Ambulatory Visit: Payer: Medicare HMO | Admitting: Psychology

## 2024-04-17 ENCOUNTER — Ambulatory Visit: Attending: Cardiology | Admitting: Cardiology

## 2024-04-17 VITALS — BP 124/62 | HR 74 | Ht 65.0 in | Wt 219.0 lb

## 2024-04-17 DIAGNOSIS — I480 Paroxysmal atrial fibrillation: Secondary | ICD-10-CM

## 2024-04-17 DIAGNOSIS — I5022 Chronic systolic (congestive) heart failure: Secondary | ICD-10-CM

## 2024-04-17 DIAGNOSIS — D6869 Other thrombophilia: Secondary | ICD-10-CM

## 2024-04-17 DIAGNOSIS — I5032 Chronic diastolic (congestive) heart failure: Secondary | ICD-10-CM

## 2024-04-17 DIAGNOSIS — I483 Typical atrial flutter: Secondary | ICD-10-CM | POA: Diagnosis not present

## 2024-04-17 MED ORDER — SPIRONOLACTONE 25 MG PO TABS: 25.0000 mg | ORAL_TABLET | Freq: Every day | ORAL | 3 refills | Status: DC

## 2024-04-17 NOTE — Progress Notes (Signed)
 Electrophysiology Clinic Note    Date:  04/17/2024  Patient ID:  Rachel Torres, Rachel Torres June 19, 1956, MRN [id number] PCP:  Clemens Curt, MD  Cardiologist:  Belva Boyden, MD Electrophysiologist: Boyce Byes, MD   Discussed the use of AI scribe software for clinical note transcription with the patient, who gave verbal consent to proceed.   Patient Profile    Chief Complaint: Afib follow-up  History of Present Illness: Rachel Torres is a 68 y.o. female with PMH notable for parox AFib, type A aortic dissection (08/2010), s/p AVR, CAD s/p CABG, HFmrEF, OSA, HTN, HLD; seen today for Boyce Byes, MD for acute visit due to increased AFib.    She last saw Dr. Marven Slimmer 04/2023 for initial EP consult. She had a TEE [facility] 02/2023 and was maintaining sinus rhythm at that time. They discussed afib treatment, decided on watchful-waiting.   She recently saw Dr. Gollan ~2 weeks ago, EKG at that time in atrial flutter, c/o increased abd fullness. She is s/p DCCV 4/16.   On follow-up today, she feels significantly better since being in sinus rhythm. She uses apple watch to monitor her heart rhythm. She continues to have abd fullness, but weight is decreasing by home scale slowly. Also has some DOE like with walking from parking spot far away to clinic. Denies chest pain, chest pressure, SOB at rest.  She continues to take eliquis  BID, no bleeding concerns.      Arrhythmia/Device History No specialty comments available.   ROS:  Please see the history of present illness. All other systems are reviewed and otherwise negative.    Physical Exam    VS:  BP 124/62 (BP Location: Left Arm, Patient Position: Sitting, Cuff Size: Normal)   Pulse 74   Ht 5\' 5"  (1.651 m)   Wt 219 lb (99.3 kg)   SpO2 96%   BMI 36.44 kg/m  BMI: Body mass index is 36.44 kg/m.  Wt Readings from Last 3 Encounters:  04/17/24 219 lb (99.3 kg)  04/04/24 220 lb (99.8 kg)  04/02/24 220 lb 4 oz (99.9 kg)      GEN- The patient is well appearing, alert and oriented x 3 today.   Lungs- Clear to ausculation bilaterally, normal work of breathing.  Heart- Regular rate and rhythm, no murmurs, rubs or gallops Extremities- No peripheral edema, warm, dry    Studies Reviewed   Previous EP, cardiology notes.    EKG is ordered. Personal review of EKG from today shows:    EKG Interpretation Date/Time:  Tuesday April 17 2024 10:30:56 EDT Ventricular Rate:  74 PR Interval:  194 QRS Duration:  112 QT Interval:  436 QTC Calculation: 483 R Axis:   -16  Text Interpretation: Normal sinus rhythm Incomplete left bundle branch block Moderate voltage criteria for LVH, may be normal variant ( R in aVL , Cornell product ) Confirmed by Roshun Klingensmith [id number]) on 04/17/2024 10:40:40 AM    Long term monitor, 12/24/2022   The patient was monitored for 5 days, 3 hours.   The predominant rhythm was sinus with an average rate of 90 bpm (range 62-126 bpm in sinus).   There were rare PACs and occasional PVCs (PVC burden 1-2%).   There were at least 10 supraventricular runs, lasting up to 15 seconds with a maximum rate of 188 bpm.   There were also 19 episodes of wide-complex tachycardia labeled as nonsustained ventricular tachycardia lasting up to 15 beats with a maximum rate  of 188 bpm.  Review of morphologies just that some/all of these episodes may actually represent SVT with aberrancy.   No sustained arrhythmia or prolonged pause was observed.   Patient triggered events correspond to sinus rhythm, PACs, and PVCs.   Predominantly sinus rhythm with rare PACs and occasional PVCs.  Several episodes of brief supraventricular tachycardia were noted.  There were also several episodes of brief wide-complex tachycardia, favored to represent SVT with aberrancy over NSVT.  No evidence of atrial fibrillation.  NM myocardial w spect, 09/30/2022 Pharmacological myocardial perfusion imaging study with no significant   ischemia Normal wall motion, EF estimated at 45% (significant GI uptake artifact noted) No EKG changes concerning for ischemia at peak stress or in recovery. Low risk scan  TTE, 09/23/2022 1. Left ventricular ejection fraction, by estimation, is 45 to 50%. The left ventricle has mildly decreased function. The left ventricle demonstrates global hypokinesis. Left ventricular diastolic parameters are consistent with Grade II diastolic dysfunction (pseudonormalization).   2. Right ventricular systolic function is normal. The right ventricular size is normal.   3. The mitral valve is normal in structure. Mild to moderate mitral valve regurgitation. No evidence of mitral stenosis.   4. The aortic valve has been repaired/replaced, bioprosthetic valve. Aortic valve regurgitation is not visualized. No aortic stenosis is present. Aortic valve area, by VTI measures 2.28 cm. Aortic valve mean gradient measures 5.0 mmHg.   5. The inferior vena cava is normal in size with greater than 50% respiratory variability, suggesting right atrial pressure of 3 mmHg.   6. There is borderline dilatation of the aortic root, measuring 36 mm.   Comparison(s): 08/02/2019-EF 40-50%.     Assessment and Plan     #) parox AFib #) aflutter S/p DCCV 4/16 and maintaining sinus rhythm She is agreeable to moving forward with treatment for her AF We discussed medications vs procedures, she is not interested in adding another pill to her regimen We reviewed the AF ablation procedure, final decision per Dr. Marven Slimmer Continue 18.75 coreg  BID Update TTE  #) Hypercoag d/t parox afib CHA2DS2-VASc Score = at least 5 [CHF History: 1, HTN History: 1, Diabetes History: 0, Stroke History: 0, Vascular Disease History: 1, Age Score: 1, Gender Score: 1].  Therefore, the patient's annual risk of stroke is 7.2 %.    Stroke ppx - 5mg  eliquis  BID, appropriately dosed No bleeding concerns    #) HFmrEF NYHA II-III symptoms Does not appear  fluid overloaded on exam, though patient endorses increased abd distention Update BMP, Mag today Start 25mg  spiro daily  Stop potassium supplement Repeat BMP in 1-2 weeks Continue 40mg  lasix  BID Continue coreg  as above         Current medicines are reviewed at length with the patient today.   The patient has concerns regarding her medicines.  The following changes were made today:   STOP potassium supplement START 25mg  spiro daily  Labs/ tests ordered today include:  Orders Placed This Encounter  Procedures   Basic metabolic panel with GFR   Magnesium    Basic metabolic panel with GFR   EKG [id number]   ECHOCARDIOGRAM COMPLETE     Disposition: Follow up with Dr. Marven Slimmer  prior to AF ablation, once date scheduled    Signed, Adaline Holly, NP  04/17/24  11:59 AM  Electrophysiology [facility]

## 2024-04-17 NOTE — Patient Instructions (Addendum)
 Medication Instructions:  STOP Potassium   START Spirolactone 25 mg daily   *If you need a refill on your cardiac medications before your next appointment, please call your pharmacy*  Lab Work: Your provider would like for you to have following labs drawn today BMET, MAG.    Your provider would like for you to return in BMET to have the following labs drawn: 1-2 weeks.   Please go to [facility] [address] ([facility] [address], [place] [address] You do not need an appointment.  They are open from 8 am- 4:30 pm.  Lunch from 1:00 pm- 2:00 pm You do not need to be fasting.   Testing/Procedures: Your physician has requested that you have an echocardiogram. Echocardiography is a painless test that uses sound waves to create images of your heart. It provides your doctor with information about the size and shape of your heart and how well your heart's chambers and valves are working.   You may receive an ultrasound enhancing agent through an IV if needed to better visualize your heart during the echo. This procedure takes approximately one hour.  There are no restrictions for this procedure.  This will take place at [address] [facility]) [address], [place] [address]  Please note: We ask at that you not bring children with you during ultrasound (echo/ vascular) testing. Due to room size and safety concerns, children are not allowed in the ultrasound rooms during exams. Our front office staff cannot provide observation of children in our lobby area while testing is being conducted. An adult accompanying a patient to their appointment will only be allowed in the ultrasound room at the discretion of the ultrasound technician under special circumstances. We apologize for any inconvenience.    We will call you for the date and time of the upcoming ablation.   Follow-Up: At [facility], you and your health needs are our priority.  As part of  our continuing mission to provide you with exceptional heart care, our providers are all part of one team.  This team includes your primary Cardiologist (physician) and Advanced Practice Providers or APPs (Physician Assistants and Nurse Practitioners) who all work together to provide you with the care you need, when you need it.  Your next appointment:   In June before ablation date. (We will call you for this appointment)   Provider:   Harvie Liner, MD    We recommend signing up for the patient portal called "MyChart".  Sign up information is provided on this After Visit Summary.  MyChart is used to connect with patients for Virtual Visits (Telemedicine).  Patients are able to view lab/test results, encounter notes, upcoming appointments, etc.  Non-urgent messages can be sent to your provider as well.   To learn more about what you can do with MyChart, go to [email or web address].

## 2024-04-18 ENCOUNTER — Ambulatory Visit (INDEPENDENT_AMBULATORY_CARE_PROVIDER_SITE_OTHER): Admitting: Psychology

## 2024-04-18 DIAGNOSIS — F331 Major depressive disorder, recurrent, moderate: Secondary | ICD-10-CM

## 2024-04-18 LAB — BASIC METABOLIC PANEL WITH GFR
BUN/Creatinine Ratio: 16 (ref 12–28)
BUN: 15 mg/dL (ref 8–27)
CO2: 25 mmol/L (ref 20–29)
Calcium: 9.2 mg/dL (ref 8.7–10.3)
Chloride: 100 mmol/L (ref 96–106)
Creatinine, Ser: 0.91 mg/dL (ref 0.57–1.00)
Glucose: 89 mg/dL (ref 70–99)
Potassium: 4.1 mmol/L (ref 3.5–5.2)
Sodium: 139 mmol/L (ref 134–144)
eGFR: 70 mL/min/{1.73_m2} (ref >59)

## 2024-04-18 LAB — MAGNESIUM: Magnesium: 2.4 mg/dL — ABNORMAL HIGH (ref 1.6–2.3)

## 2024-04-18 NOTE — Progress Notes (Signed)
  Behavioral Health Counselor/Therapist Progress Note  Patient ID: Rachel Torres, MRN: [id number],    Date: 04/18/2024  Time Spent: 11:00am-11:50am  50 minutes   Treatment Type: Individual Therapy  Reported Symptoms: stress  Mental Status Exam: Appearance:  Casual     Behavior: Appropriate  Motor: Normal  Speech/Language:  Normal Rate  Affect: Appropriate  Mood: normal  Thought process: normal  Thought content:   WNL  Sensory/Perceptual disturbances:   WNL  Orientation: oriented to person, place, time/date, and situation  Attention: Good  Concentration: Good  Memory: WNL  Fund of knowledge:  Good  Insight:   Good  Judgment:  Good  Impulse Control: Good   Risk Assessment: Danger to Self:  No Self-injurious Behavior: No Danger to Others: No Duty to Warn:no Physical Aggression / Violence:No  Access to Firearms a concern: No  Gang Involvement:No   Subjective: Pt present for face-to-face individual therapy via video.  Pt consents to telehealth video session and is aware of limitations and benefits of virtual sessions.   Location of pt: home Location of therapist: home office.   Pt talked about her health.   Pt saw the doctor and he recommended pt get an ablation of her heart.  Pt is worried about her heart issue.   She will have the procedure in a few weeks.  She will need extra help with caregivers for [name] while she recuperates from surgery.   Pt talked about her mother who is having health issues.  Pt is worried about her mother.   Addressed pt's concerns.   Addressed the stress of caregiving for [name].  It is frustrating to deal with his short term memory loss.   Pt has to constantly repeat herself.   Worked on Optician, dispensing. Pt talked about feeling stress with managing her belongings.   Pt has a lot of things she needs to get rid of and donate.  She does not feel she has the energy right now to deal with that.   Worked on self care strategies. Provided  supportive therapy.    Interventions: Cognitive Behavioral Therapy and Insight-Oriented  Diagnosis:  F33.1   Plan of Care: Recommend ongoing therapy.   Pt participated in setting treatment goals.   Pt needs a place to talk about stressors.  Plan to meet every week.  Pt agrees with treatment plan.    Treatment Plan (target date: 03/27/2025) Client Abilities/Strengths  Pt is bright, engaging, and motivated for therapy.   Client Treatment Preferences  Individual therapy.  Client Statement of Needs  Improve coping skills.  Have a place to talk about stressors.   Symptoms  Depressed or irritable mood. Unresolved grief issues.  Problems Addressed  Unipolar Depression Goals 1. Alleviate depressive symptoms and return to previous level of effective functioning. 2. Appropriately grieve the loss in order to normalize mood and to return to previously adaptive level of functioning. Objective Learn and implement behavioral strategies to overcome depression. Target Date: 2025-03-27 Frequency: weekly  Progress: 45 Modality: individual  Related Interventions Engage the client in "behavioral activation," increasing his/her activity level and contact with sources of reward, while identifying processes that inhibit activation.  Use behavioral techniques such as instruction, rehearsal, role-playing, role reversal, as needed, to facilitate activity in the client's daily life; reinforce success. Assist the client in developing skills that increase the likelihood of deriving pleasure from behavioral activation (e.g., assertiveness skills, developing an exercise plan, less internal/more external focus, increased social involvement); reinforce success. Objective Identify  important people in life, past and present, and describe the quality, good and poor, of those relationships. Target Date: 2025-03-27 Frequency: weekly  Progress: 45 Modality: individual  Related Interventions Conduct Interpersonal Therapy  beginning with the assessment of the client's "interpersonal inventory" of important past and present relationships; develop a case formulation linking depression to grief, interpersonal role disputes, role transitions, and/or interpersonal deficits). Objective Learn and implement problem-solving and decision-making skills. Target Date: 2025-03-27 Frequency: weekly  Progress: 45 Modality: individual  Related Interventions Conduct Problem-Solving Therapy using techniques such as psychoeducation, modeling, and role-playing to teach client problem-solving skills (i.e., defining a problem specifically, generating possible solutions, evaluating the pros and cons of each solution, selecting and implementing a plan of action, evaluating the efficacy of the plan, accepting or revising the plan); role-play application of the problem-solving skill to a real life issue. Encourage in the client the development of a positive problem orientation in which problems and solving them are viewed as a natural part of life and not something to be feared, despaired, or avoided. 3. Develop healthy interpersonal relationships that lead to the alleviation and help prevent the relapse of depression. 4. Develop healthy thinking patterns and beliefs about self, others, and the world that lead to the alleviation and help prevent the relapse of depression. 5. Recognize, accept, and cope with feelings of depression. Diagnosis F33.1  Conditions For Discharge Achievement of treatment goals and objectives   Willey Harrier, LCSW

## 2024-04-19 ENCOUNTER — Ambulatory Visit: Payer: Medicare HMO | Attending: Orthopedic Surgery

## 2024-04-19 DIAGNOSIS — R293 Abnormal posture: Secondary | ICD-10-CM | POA: Insufficient documentation

## 2024-04-19 DIAGNOSIS — M5459 Other low back pain: Secondary | ICD-10-CM | POA: Diagnosis not present

## 2024-04-19 DIAGNOSIS — M25612 Stiffness of left shoulder, not elsewhere classified: Secondary | ICD-10-CM | POA: Insufficient documentation

## 2024-04-19 DIAGNOSIS — R2689 Other abnormalities of gait and mobility: Secondary | ICD-10-CM | POA: Insufficient documentation

## 2024-04-19 NOTE — Therapy (Signed)
 OUTPATIENT PHYSICAL THERAPY TREATMENT      Patient Name: Rachel Torres MRN: [id number] DOB:1956/03/09, 68 y.o., female Today's Date: 04/19/2024  END OF SESSION:  PT End of Session - 04/19/24 0930     Visit Number 31    Number of Visits 44    Date for PT Re-Evaluation 05/17/24    Authorization Type [employer]  Medicare HMO    Authorization Time Period 01/26/24-03/22/24    Progress Note Due on Visit 10    PT Start Time 0930    PT Stop Time 1014    PT Time Calculation (min) 44 min    Activity Tolerance Patient tolerated treatment well;No increased pain    Behavior During Therapy WFL for tasks assessed/performed                   Past Medical History:  Diagnosis Date   AAA (abdominal aortic aneurysm) (HCC)    a. Chronic w/o evidence of aneurysmal dil on CTA 01/2016.   Alcohol abuse    Allergy    Anemia    Anxiety    Asthma    CAD (coronary artery disease)    a. 08/2010 s/p CABG x 1 (VG->RCA) @ time of Ao dissection repair; b. 01/2016 Lexiscan  MV: mid antsept/apical defect w/ ? peri-infarct ischemia-->likely attenuation-->Med Rx.   Chewing difficulty    Chronic combined systolic (congestive) and diastolic (congestive) heart failure (HCC)    a. 2012 EF 30-35%; b. 04/2015 EF 40-45%; c. 01/2016 Echo: Ef 55-65%, nl AoV bioprosthesis, nl RV, nl PASP.   Constipation    Depression    Edema of both lower extremities    Gallbladder sludge    GERD (gastroesophageal reflux disease)    Heart failure (HCC)    Heart valve problem    History of Bicuspid Aortic Valve    a. 08/2010 s/p AVR @ time of Ao dissection repair; b. 01/2016 Echo: Ef 55-65%, nl AoV bioprosthesis, nl RV, nl PASP.   Hx of repair of dissecting thoracic aortic aneurysm, Stanford type A    a. 08/2010 s/p repair with AVR and VG->RCA; b. 01/2016 CTA: stable appearance of Asc Thoracic Aortic graft. Opacification of flase lumen of chronic abd Ao dissection w/ retrograde flow through lumbar arteries. No aneurysmal dil of flase  lumen.   Hyperlipidemia    Hypertension    Hypertensive heart disease    IBS (irritable bowel syndrome)    Internal hemorrhoids    Kidney problem    Marfan syndrome    pt denies   Migraine    Obstructive sleep apnea    Osteoarthritis    Osteoporosis    Palpitations    Pneumonia    RA (rheumatoid arthritis) (HCC)    a. Followed by Dr. Chaim Colony   Past Surgical History:  Procedure Laterality Date   ABDOMINAL AORTIC ANEURYSM REPAIR     CARDIAC CATHETERIZATION  2001   CARDIOVERSION N/A 04/04/2024   Procedure: CARDIOVERSION;  Surgeon: Devorah Fonder, MD;  Location: [facility] ORS;  Service: Cardiovascular;  Laterality: N/A;   CESAREAN SECTION  1986 & 1991   CHOLECYSTECTOMY     COLONOSCOPY     COLONOSCOPY WITH PROPOFOL  N/A 01/19/2024   Procedure: COLONOSCOPY WITH PROPOFOL ;  Surgeon: Luke Salaam, MD;  Location: [facility] ENDOSCOPY;  Service: Gastroenterology;  Laterality: N/A;   ESOPHAGOGASTRODUODENOSCOPY (EGD) WITH PROPOFOL  N/A 05/08/2020   Procedure: ESOPHAGOGASTRODUODENOSCOPY (EGD) WITH PROPOFOL ;  Surgeon: Irby Mannan, MD;  Location: [facility] ENDOSCOPY;  Service: Endoscopy;  Laterality: N/A;  FRACTURE SURGERY Right    shoulder replacement   HEMORRHOID BANDING     ORIF DISTAL RADIUS FRACTURE Left    POLYPECTOMY  01/19/2024   Procedure: POLYPECTOMY;  Surgeon: Luke Salaam, MD;  Location: [facility] ENDOSCOPY;  Service: Gastroenterology;;   UPPER GASTROINTESTINAL ENDOSCOPY     Patient Active Problem List   Diagnosis Date Noted   Typical atrial flutter (HCC) 04/04/2024   Adenomatous polyp of colon 01/19/2024   Heart failure with improved ejection fraction (HFimpEF) (HCC) 01/18/2024   Abnormal metabolism 11/14/2023   Weight regain after inital weight loss 11/14/2023   Multiple closed tarsal fractures of left foot, sequela 07/12/2023   Arm contusion, right, sequela 07/12/2023   Lumbar disc disease 07/12/2023   PAF (paroxysmal atrial fibrillation) (HCC) 06/09/2023   Paresthesia 06/03/2023    Obesity, Beginning BMI 35.45 02/17/2023   Tinnitus 01/14/2023   Change in vision 01/14/2023   Encounter for screening colonoscopy 01/14/2023   Personal history of fall 01/14/2023   Problem related to unspecified psychosocial circumstances 12/28/2022   Person encountering health services to consult on behalf of another person 12/28/2022   Palpitation 12/10/2022   Fatigue 10/26/2022   B12 deficiency 10/26/2022   Right shoulder pain 09/15/2022   Depression 09/14/2022   Other constipation 05/12/2022   Medicare annual wellness visit, initial 07/17/2021   TMJ (dislocation of temporomandibular joint) 07/17/2021   Encounter for screening mammogram for breast cancer 06/05/2021   Diarrhea 04/11/2020   Epigastric pain 02/19/2020   Dark stools 02/19/2020   History of atrial fibrillation 02/05/2019   SOB (shortness of breath) 01/27/2019   Irregular surface of cornea 12/25/2018   Acute cough 09/08/2017   History of HPV infection 12/01/2016   Screening mammogram, encounter for 11/29/2016   Estrogen deficiency 11/29/2016   Encounter for routine gynecological examination 11/29/2016   Grade III hemorrhoids 10/06/2016   Tachycardia 08/14/2016   Hemorrhoids 08/13/2016   Atypical migraine 08/03/2016   CAD (coronary artery disease)    AAA (abdominal aortic aneurysm) (HCC)    Chronic combined systolic (congestive) and diastolic (congestive) heart failure (HCC)    Prediabetes 02/11/2016   Generalized anxiety disorder 12/08/2015   Panic attacks 07/28/2015   Chest pain 07/09/2013   Sensorineural hearing loss 03/27/2013   Vitamin D  deficiency 07/03/2012   H/O aortic dissection 05/12/2012   Routine general medical examination at a health care facility 09/20/2011   Atypical chest pain 09/15/2011   S/P AVR (aortic valve replacement) 01/07/2011   CORONARY ARTERY BYPASS GRAFT, HX OF 01/07/2011   Arthritis 12/20/2010   Back pain 12/15/2010   H/O dissecting abdominal aortic aneurysm repair 08/24/2010    IRRITABLE BOWEL SYNDROME 09/09/2009   Obstructive sleep apnea 08/04/2009   External hemorrhoid 06/26/2009   Osteoporosis 01/09/2009   Hyperlipidemia 07/26/2007   Depression with anxiety 07/26/2007   Essential hypertension 07/26/2007   Allergic rhinitis 07/26/2007   Asthma 07/26/2007   GERD 07/26/2007   Osteoarthritis 07/26/2007   Ocular migraine 07/26/2007   Mild intermittent asthma without complication 07/26/2007    PCP: Clemens Curt MD   REFERRING PROVIDER: Bert Britain  REFERRING DIAG: L rotator cuff   THERAPY DIAG:  Abnormal posture  Other abnormalities of gait and mobility  Other low back pain  Rationale for Evaluation and Treatment: Rehabilitation  ONSET DATE: Feb 2024  SUBJECTIVE:  SUBJECTIVE STATEMENT:   Patient has been focusing on her posture which is helping her back.   Hand dominance: Left  PERTINENT HISTORY: Ave Schmier is a 66yoF familiar to our services. Pt referred to PT for strengthening of chronic left shoulder DJD, known remote RC injury. Recently fell and broke four metatarsals, since cleared from use of CAM rocker. She was treated successfully in past for right shoulder pain and has past medical history of Right shoulder surgery replacement. 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 PMH aorta type A dissection in sept 2011 s/p aortic valve replacement, bypass of R coronary artery, anemia, anxiety, CAD, CHF, depression, GERD, Heart failure, HLD, HTN, IBS, migraine, osteoarthritis, osteoporosis, RA, back pain.    Back pain:  Patient has new referral for radiculopathy, lumbar region. MRI showed degeneration of L1-2 and L2-3 with some disc space collapse and minor retrolisthesis of L2-3. Issue began in February, felt numbness in L thigh. No cause known, but she was at  a museum and noticed it was numb. Sometimes L foot numbness and toe numbness of 2nd and 3rd toe causing tripping. This caused her to trip July 4th and break five metatarsals. Some minor pain in the low pain noted. Pain noted to be 2-3/10 Pain in hip 3-4/10 of L hip.    PAIN:  Are you having pain? Yes, 3/10 pain in lateral deltoids in the shoulder.  PRECAUTIONS: None  FALLS:  Has patient fallen in last 6 months? Yes. Number of falls 2  PATIENT GOALS:to lift more than 3lb with LUE, be able to eat a meal without arm being tired.    OBJECTIVE:  DIAGNOSTIC FINDINGS:  IMPRESSION: Mild degenerative change in the cervical spine without acute fracture or traumatic subluxation.   IMPRESSION: Full width, full width tears of the supraspinatus and infraspinatus tendons with retraction to the glenoid. Grade 2/3 infraspinatus atrophy.   Moderate tendinosis of the distal subscapularis tendon with articular sided fraying of the cephalad fibers.   Moderate intra-articular long head biceps tendinosis with fraying and possible partial tearing.   Mild glenohumeral and moderate AC joint osteoarthritis.   Degenerative superior labral tearing anteriorly and posteriorly.   PATIENT SURVEYS:  Quick Dash 50%  and FOTO 46%   COGNITION: Overall cognitive status: Within functional limits for tasks assessed                                  SENSATION: WFL   POSTURE: Slight rounded shoulders and shoulder elevation    UPPER EXTREMITY ROM:    Active ROM Right eval 9/16: recert RUE Left eval 9/16 RECERT LUE  Shoulder flexion 64 122 91 92  Shoulder extension        Shoulder abduction 95 88 52 82  Shoulder adduction        Shoulder internal rotation Unable to test at 90 61 degrees 5 40  Shoulder external rotation 18at side of body -5 degrees at 90 degrees 34 35  Elbow flexion [facility]  WFL   Elbow extension [facility]  WFL   Wrist flexion        Wrist extension        Wrist ulnar deviation        Wrist  radial deviation        Wrist pronation        Wrist supination        (Blank rows = not tested)   UPPER  EXTREMITY MMT:   MMT Right eval Left eval  Shoulder flexion      Shoulder extension      Shoulder abduction      Shoulder adduction      Shoulder internal rotation      Shoulder external rotation      Middle trapezius      Lower trapezius      Elbow flexion      Elbow extension      Wrist flexion      Wrist extension      Wrist ulnar deviation      Wrist radial deviation      Wrist pronation      Wrist supination      Grip strength (lbs) 30 35  (Blank rows = not tested) Unable to test strength due to pain with motion   TRUNK ROM: Trunk Flexion 65  Trunk Extension 7  Trunk R SB Limited 25%  Trunk L SB WFL  Trunk R rotation WFL  Trunk L rotation Limited 25%    Ankle ROM Seated: Motion: AROM seated Right Left  Plantarflexion 4+ 3  Dorsiflexion 4+ 4  Inversion 4 3+  Eversion 4+ 3+    SHOULDER SPECIAL TESTS: Impingement tests:  unable to test due to pain in starting position SLAP lesions:  unable to test due to pain in starting position Instability tests:  unable to test due to pain in starting position Rotator cuff assessment:  unable to test due to pain in starting position Biceps assessment:  unable to test due to pain in starting position   LOW BACK SPECIAL TEST: SLR: negative  Scour test: negative FABER: negative Pelvic compression/distraction: negative  Slump test positive LLE  6 min walk test: 1160 ft increased left foot drag and antalgic gait pattern with foot eversion    JOINT MOBILITY TESTING:  AP: painful PA: painful Inferior: painful Distraction: reduces pain   Back mobilization hypomobile and painful to grade II mobilizations    PALPATION:  Tenderness to all joint regions of GH     TODAY'S TREATMENT: DATE: 04/19/24     Manual:   Grade II thoracic and lumbar mobilizations  TherEx Supine: -bring leg on top of other leg into  figure four position 10x;  -hamstring stretch 60 seconds each LE -single knee to chest 60 seconds each LE -figure four stretch 60 seconds each LE  -nerve glide 25x each LE -posterior pelvic tilt 10x -BTB abduction 15x; x 2 sets -BTB march 15xx 2 sets -scapular retraction 15x   Sidelying: -thoracic rotation 10x10 second holds  Seated: Large swiss ball forward trunk rotation x 4 minutes  BTB 4 way ankle 10x each direction BTB hamstring curl 15x each LE     PATIENT EDUCATION: Education details: goals, POC  Person educated: Patient Education method: Explanation, Demonstration, Tactile cues, Verbal cues, and Handouts Education comprehension: verbalized understanding, returned demonstration, verbal cues required, and tactile cues required  HOME EXERCISE PROGRAM:  Access Code: [email or web address] URL: [email or web address] Date: 10/03/2023 Prepared by: Winona Sison  Exercises - Seated Scapular Retraction  - 1 x daily - 7 x weekly - 2 sets - 10 reps - 5 hold - Neck Sidebending  - 1 x daily - 7 x weekly - 2 sets - 2 reps - 30 hold - Supine Isometric Shoulder Extension with Towel  - 1 x daily - 7 x weekly - 2 sets - 10 reps - 5 hold - Supine Isometric Shoulder Adduction with Towel Roll  -  1 x daily - 7 x weekly - 2 sets - 10 reps - 5 hold - Seated Bilateral Shoulder Flexion Towel Slide at Table Top  - 1 x daily - 7 x weekly - 1 sets - 3 reps - 30 hold - Supine Posterior Pelvic Tilt  - 1 x daily - 7 x weekly - 2 sets - 5 reps - 30 hold - Supine LE Neural Mobilization  - 1 x daily - 7 x weekly - 2 sets - 10 reps - 5 hold - Seated Ankle Alphabet  - 1 x daily - 7 x weekly - 2 sets - 1 reps - 5 hold  ASSESSMENT:  CLINICAL IMPRESSION:   Patient has increased thoracic pain with LE strengthening interventions this session requiring intermittent thoracic stretching techniques for pain reduction. Patient reports decreased pain by end of session.   Pt will continue to benefit from  skilled therapy to address remaining deficits in order to improve overall QoL and return to PLOF.     OBJECTIVE IMPAIRMENTS: cardiopulmonary status limiting activity, decreased activity tolerance, decreased mobility, increased fascial restrictions, impaired perceived functional ability, increased muscle spasms, impaired flexibility, impaired UE functional use, improper body mechanics, postural dysfunction, and pain.   ACTIVITY LIMITATIONS: carrying, lifting, bed mobility, bathing, dressing, self feeding, reach over head, and caring for others  PARTICIPATION LIMITATIONS: meal prep, cleaning, laundry, personal finances, interpersonal relationship, driving, shopping, community activity, yard work, church, and caregiving for husband  PERSONAL FACTORS: Age, Fitness, Past/current experiences, Time since onset of injury/illness/exacerbation, and 3+ comorbidities: aorta type A dissection in sept 2011 with aortic valve replacement bypass of R coronary artery, AAA, anemia, anxiety, CAD, CHF, depression, GERD, Heart failure, HLD, HTN, IBS, Kidney problems, migraine, osteoarthritis, osteoporosis, RA, ocular migraines, back pain  are also affecting patient's functional outcome.   REHAB POTENTIAL: Fair    CLINICAL DECISION MAKING: Evolving/moderate complexity  EVALUATION COMPLEXITY: Moderate   GOALS: Goals reviewed with patient? Yes  SHORT TERM GOALS: Target date: 04/19/2024    Patient will be independent in home exercise program to improve strength/mobility for better functional independence with ADLs. Baseline:7/23: HEP given 9/16: compliant 1x/week 4/3: improving compliance Goal status: On going     LONG TERM GOALS: Target date: 05/17/2024    Patient will increase FOTO score to equal to or greater than  57%   to demonstrate statistically significant improvement in mobility and quality of life.  Baseline: 7/22: 46 9/16: 50% 10/7: 40% 12/10: 57% Goal status: MET  2.  Patient will improve  shoulder AROM to > 140 degrees of flexion, scaption, and abduction for improved ability to perform overhead activities. Baseline: 7/22: see above 9/16: see above 10/7 : defer to exacerbation 12/10: shoulder abduction >140, flexion < 140 , assess scaption next visit 2/6: flexion >140, abduction >140 scaption >140 Goal status: MET  3.  Patient will demonstrate adequate shoulder ROM and strength to be able to shave and dress independently with pain less than 3/10. Baseline: 7/23: 6/10 pain with dressing, limited ROM, unable to test strength 9/16: 3/10 pain with dressing  Goal status: MET  4.  Patient will decrease Quick DASH score by > 8 points demonstrating reduced self-reported upper extremity disability for L and R shoulder  Baseline: 7/23: L shoulder 50% 9/16: R=54.54 % L 66% L 81% R 24% 12/10: R: 43.2% L: 56.8% 2/6: L 22%  R 34%  Goal status: MET  5.  Patient will be able to eat an entire meal without resting arm  demonstrating improved functional strength Baseline: 7/23: has to rest while eating due to pain. 9/16: able to make halfway through meal 10/7: can make it about halfway through meal, some meals can get 3/4s through meal 12/10: improved, still has to rest every once in a while 2/6: can get halfway to 2/3rds of the meal.  Goal status: deferred at this time to focus on back/leg  6.  Patient will be able to stand 15 minutes without her LLE going numb allowing for ADLS such as showering and iADLs such as cooking Baseline: 5 minutes 12/10: will assess next visit 2/6: 10 minutes 4/3: 10 minutes Goal status: IN PROGRESS  7. Patient will increase six minute walk test distance to >1500 for progression to community ambulator and improve gait ability Baseline: 1160 ft  12/10: 1010 ft 2/6: 1140 ft  4/3: 1201 ft  Goal status: IN PROGRESS  8  Patient will reduce modified Oswestry score to <20 as to demonstrate minimal disability with ADLs including improved sleeping tolerance, walking/sitting  tolerance etc for better mobility with ADLs.  Baseline: 2/6: 60% 4/3: 50%  Goal status: In progress  PLAN:  PT FREQUENCY: 2x/week  PT DURATION: 8 weeks  PLANNED INTERVENTIONS: Therapeutic exercises, Therapeutic activity, Neuromuscular re-education, Balance training, Gait training, Patient/Family education, Self Care, Joint mobilization, Vestibular training, Canalith repositioning, Visual/preceptual remediation/compensation, Dry Needling, Electrical stimulation, Spinal mobilization, Cryotherapy, Moist heat, Compression bandaging, scar mobilization, Splintting, Taping, Traction, Ultrasound, Biofeedback, Ionotophoresis 4mg /ml Dexamethasone , Manual therapy, and Re-evaluation  PLAN FOR NEXT SESSION:  Continue with LUE strengthening  L ankle strengthening, distraction, core activation, reassess scaption and flexion ROM, make HEP    Belia Febo  Brain Cahill, PT, DPT Physical Therapist - [facility]  Outpatient Physical Therapy- Main [facility] [phone]    04/19/24, 10:15 AM

## 2024-04-19 NOTE — Therapy (Signed)
 OUTPATIENT PHYSICAL THERAPY TREATMENT      Patient Name: Rachel Torres MRN: [id number] DOB:08-07-1956, 68 y.o., female Today's Date: 04/23/2024  END OF SESSION:  PT End of Session - 04/23/24 1054     Visit Number 32    Number of Visits 44    Date for PT Re-Evaluation 05/17/24    Authorization Type [employer]  Medicare HMO    Authorization Time Period 01/26/24-03/22/24    Progress Note Due on Visit 10    PT Start Time 1145    PT Stop Time 1229    PT Time Calculation (min) 44 min    Activity Tolerance Patient tolerated treatment well;No increased pain    Behavior During Therapy WFL for tasks assessed/performed                    Past Medical History:  Diagnosis Date   AAA (abdominal aortic aneurysm) (HCC)    a. Chronic w/o evidence of aneurysmal dil on CTA 01/2016.   Alcohol abuse    Allergy    Anemia    Anxiety    Asthma    CAD (coronary artery disease)    a. 08/2010 s/p CABG x 1 (VG->RCA) @ time of Ao dissection repair; b. 01/2016 Lexiscan  MV: mid antsept/apical defect w/ ? peri-infarct ischemia-->likely attenuation-->Med Rx.   Chewing difficulty    Chronic combined systolic (congestive) and diastolic (congestive) heart failure (HCC)    a. 2012 EF 30-35%; b. 04/2015 EF 40-45%; c. 01/2016 Echo: Ef 55-65%, nl AoV bioprosthesis, nl RV, nl PASP.   Constipation    Depression    Edema of both lower extremities    Gallbladder sludge    GERD (gastroesophageal reflux disease)    Heart failure (HCC)    Heart valve problem    History of Bicuspid Aortic Valve    a. 08/2010 s/p AVR @ time of Ao dissection repair; b. 01/2016 Echo: Ef 55-65%, nl AoV bioprosthesis, nl RV, nl PASP.   Hx of repair of dissecting thoracic aortic aneurysm, Stanford type A    a. 08/2010 s/p repair with AVR and VG->RCA; b. 01/2016 CTA: stable appearance of Asc Thoracic Aortic graft. Opacification of flase lumen of chronic abd Ao dissection w/ retrograde flow through lumbar arteries. No aneurysmal dil of  flase lumen.   Hyperlipidemia    Hypertension    Hypertensive heart disease    IBS (irritable bowel syndrome)    Internal hemorrhoids    Kidney problem    Marfan syndrome    pt denies   Migraine    Obstructive sleep apnea    Osteoarthritis    Osteoporosis    Palpitations    Pneumonia    RA (rheumatoid arthritis) (HCC)    a. Followed by Dr. Chaim Colony   Past Surgical History:  Procedure Laterality Date   ABDOMINAL AORTIC ANEURYSM REPAIR     CARDIAC CATHETERIZATION  2001   CARDIOVERSION N/A 04/04/2024   Procedure: CARDIOVERSION;  Surgeon: Devorah Fonder, MD;  Location: [facility] ORS;  Service: Cardiovascular;  Laterality: N/A;   CESAREAN SECTION  1986 & 1991   CHOLECYSTECTOMY     COLONOSCOPY     COLONOSCOPY WITH PROPOFOL  N/A 01/19/2024   Procedure: COLONOSCOPY WITH PROPOFOL ;  Surgeon: Luke Salaam, MD;  Location: [facility] ENDOSCOPY;  Service: Gastroenterology;  Laterality: N/A;   ESOPHAGOGASTRODUODENOSCOPY (EGD) WITH PROPOFOL  N/A 05/08/2020   Procedure: ESOPHAGOGASTRODUODENOSCOPY (EGD) WITH PROPOFOL ;  Surgeon: Irby Mannan, MD;  Location: [facility] ENDOSCOPY;  Service: Endoscopy;  Laterality: N/A;  FRACTURE SURGERY Right    shoulder replacement   HEMORRHOID BANDING     ORIF DISTAL RADIUS FRACTURE Left    POLYPECTOMY  01/19/2024   Procedure: POLYPECTOMY;  Surgeon: Luke Salaam, MD;  Location: [facility] ENDOSCOPY;  Service: Gastroenterology;;   UPPER GASTROINTESTINAL ENDOSCOPY     Patient Active Problem List   Diagnosis Date Noted   Typical atrial flutter (HCC) 04/04/2024   Adenomatous polyp of colon 01/19/2024   Heart failure with improved ejection fraction (HFimpEF) (HCC) 01/18/2024   Abnormal metabolism 11/14/2023   Weight regain after inital weight loss 11/14/2023   Multiple closed tarsal fractures of left foot, sequela 07/12/2023   Arm contusion, right, sequela 07/12/2023   Lumbar disc disease 07/12/2023   PAF (paroxysmal atrial fibrillation) (HCC) 06/09/2023   Paresthesia  06/03/2023   Obesity, Beginning BMI 35.45 02/17/2023   Tinnitus 01/14/2023   Change in vision 01/14/2023   Encounter for screening colonoscopy 01/14/2023   Personal history of fall 01/14/2023   Problem related to unspecified psychosocial circumstances 12/28/2022   Person encountering health services to consult on behalf of another person 12/28/2022   Palpitation 12/10/2022   Fatigue 10/26/2022   B12 deficiency 10/26/2022   Right shoulder pain 09/15/2022   Depression 09/14/2022   Other constipation 05/12/2022   Medicare annual wellness visit, initial 07/17/2021   TMJ (dislocation of temporomandibular joint) 07/17/2021   Encounter for screening mammogram for breast cancer 06/05/2021   Diarrhea 04/11/2020   Epigastric pain 02/19/2020   Dark stools 02/19/2020   History of atrial fibrillation 02/05/2019   SOB (shortness of breath) 01/27/2019   Irregular surface of cornea 12/25/2018   Acute cough 09/08/2017   History of HPV infection 12/01/2016   Screening mammogram, encounter for 11/29/2016   Estrogen deficiency 11/29/2016   Encounter for routine gynecological examination 11/29/2016   Grade III hemorrhoids 10/06/2016   Tachycardia 08/14/2016   Hemorrhoids 08/13/2016   Atypical migraine 08/03/2016   CAD (coronary artery disease)    AAA (abdominal aortic aneurysm) (HCC)    Chronic combined systolic (congestive) and diastolic (congestive) heart failure (HCC)    Prediabetes 02/11/2016   Generalized anxiety disorder 12/08/2015   Panic attacks 07/28/2015   Chest pain 07/09/2013   Sensorineural hearing loss 03/27/2013   Vitamin D  deficiency 07/03/2012   H/O aortic dissection 05/12/2012   Routine general medical examination at a health care facility 09/20/2011   Atypical chest pain 09/15/2011   S/P AVR (aortic valve replacement) 01/07/2011   CORONARY ARTERY BYPASS GRAFT, HX OF 01/07/2011   Arthritis 12/20/2010   Back pain 12/15/2010   H/O dissecting abdominal aortic aneurysm repair  08/24/2010   IRRITABLE BOWEL SYNDROME 09/09/2009   Obstructive sleep apnea 08/04/2009   External hemorrhoid 06/26/2009   Osteoporosis 01/09/2009   Hyperlipidemia 07/26/2007   Depression with anxiety 07/26/2007   Essential hypertension 07/26/2007   Allergic rhinitis 07/26/2007   Asthma 07/26/2007   GERD 07/26/2007   Osteoarthritis 07/26/2007   Ocular migraine 07/26/2007   Mild intermittent asthma without complication 07/26/2007    PCP: Clemens Curt MD   REFERRING PROVIDER: Bert Britain  REFERRING DIAG: L rotator cuff   THERAPY DIAG:  Abnormal posture  Other abnormalities of gait and mobility  Other low back pain  Stiffness of left shoulder, not elsewhere classified  Rationale for Evaluation and Treatment: Rehabilitation  ONSET DATE: Feb 2024  SUBJECTIVE:  SUBJECTIVE STATEMENT:   Patient fell this weekend, Turned and her L foot didn't pick up and rolled her foot off the side of the sandal.   Hand dominance: Left  PERTINENT HISTORY: Rachel Torres is a 66yoF familiar to our services. Pt referred to PT for strengthening of chronic left shoulder DJD, known remote RC injury. Recently fell and broke four metatarsals, since cleared from use of CAM rocker. She was treated successfully in past for right shoulder pain and has past medical history of Right shoulder surgery replacement. 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 PMH aorta type A dissection in sept 2011 s/p aortic valve replacement, bypass of R coronary artery, anemia, anxiety, CAD, CHF, depression, GERD, Heart failure, HLD, HTN, IBS, migraine, osteoarthritis, osteoporosis, RA, back pain.    Back pain:  Patient has new referral for radiculopathy, lumbar region. MRI showed degeneration of L1-2 and L2-3 with some disc space collapse  and minor retrolisthesis of L2-3. Issue began in February, felt numbness in L thigh. No cause known, but she was at a museum and noticed it was numb. Sometimes L foot numbness and toe numbness of 2nd and 3rd toe causing tripping. This caused her to trip July 4th and break five metatarsals. Some minor pain in the low pain noted. Pain noted to be 2-3/10 Pain in hip 3-4/10 of L hip.    PAIN:  Are you having pain? Yes, 3/10 pain in lateral deltoids in the shoulder.  PRECAUTIONS: None  FALLS:  Has patient fallen in last 6 months? Yes. Number of falls 2  PATIENT GOALS:to lift more than 3lb with LUE, be able to eat a meal without arm being tired.    OBJECTIVE:  DIAGNOSTIC FINDINGS:  IMPRESSION: Mild degenerative change in the cervical spine without acute fracture or traumatic subluxation.   IMPRESSION: Full width, full width tears of the supraspinatus and infraspinatus tendons with retraction to the glenoid. Grade 2/3 infraspinatus atrophy.   Moderate tendinosis of the distal subscapularis tendon with articular sided fraying of the cephalad fibers.   Moderate intra-articular long head biceps tendinosis with fraying and possible partial tearing.   Mild glenohumeral and moderate AC joint osteoarthritis.   Degenerative superior labral tearing anteriorly and posteriorly.   PATIENT SURVEYS:  Quick Dash 50%  and FOTO 46%   COGNITION: Overall cognitive status: Within functional limits for tasks assessed                                  SENSATION: WFL   POSTURE: Slight rounded shoulders and shoulder elevation    UPPER EXTREMITY ROM:    Active ROM Right eval 9/16: recert RUE Left eval 9/16 RECERT LUE  Shoulder flexion 64 122 91 92  Shoulder extension        Shoulder abduction 95 88 52 82  Shoulder adduction        Shoulder internal rotation Unable to test at 90 61 degrees 5 40  Shoulder external rotation 18at side of body -5 degrees at 90 degrees 34 35  Elbow flexion [facility]   WFL   Elbow extension [facility]  WFL   Wrist flexion        Wrist extension        Wrist ulnar deviation        Wrist radial deviation        Wrist pronation        Wrist supination        (  Blank rows = not tested)   UPPER EXTREMITY MMT:   MMT Right eval Left eval  Shoulder flexion      Shoulder extension      Shoulder abduction      Shoulder adduction      Shoulder internal rotation      Shoulder external rotation      Middle trapezius      Lower trapezius      Elbow flexion      Elbow extension      Wrist flexion      Wrist extension      Wrist ulnar deviation      Wrist radial deviation      Wrist pronation      Wrist supination      Grip strength (lbs) 30 35  (Blank rows = not tested) Unable to test strength due to pain with motion   TRUNK ROM: Trunk Flexion 65  Trunk Extension 7  Trunk R SB Limited 25%  Trunk L SB WFL  Trunk R rotation WFL  Trunk L rotation Limited 25%    Ankle ROM Seated: Motion: AROM seated Right Left  Plantarflexion 4+ 3  Dorsiflexion 4+ 4  Inversion 4 3+  Eversion 4+ 3+    SHOULDER SPECIAL TESTS: Impingement tests:  unable to test due to pain in starting position SLAP lesions:  unable to test due to pain in starting position Instability tests:  unable to test due to pain in starting position Rotator cuff assessment:  unable to test due to pain in starting position Biceps assessment:  unable to test due to pain in starting position   LOW BACK SPECIAL TEST: SLR: negative  Scour test: negative FABER: negative Pelvic compression/distraction: negative  Slump test positive LLE  6 min walk test: 1160 ft increased left foot drag and antalgic gait pattern with foot eversion    JOINT MOBILITY TESTING:  AP: painful PA: painful Inferior: painful Distraction: reduces pain   Back mobilization hypomobile and painful to grade II mobilizations    PALPATION:  Tenderness to all joint regions of GH     TODAY'S TREATMENT: DATE: 04/23/24     TherEx Supine: -bring leg on top of other leg into figure four position 10x;  -hamstring stretch 60 seconds each LE -single knee to chest 60 seconds each LE -figure four stretch 60 seconds each LE  -nerve glide 25x each LE -posterior pelvic tilt 10x -bridge 10x; 2 sets  -BTB abduction 15x; x 2 sets -BTB march 15xx 2 sets -scapular retraction 15x   Sidelying: -thoracic rotation 10x10 second holds  Seated: Adduction squeeze 15x  RTB bilateral ER 10x  RTB df alternating at shoulder width apart position 10x LAQ with adduction squeeze 15x  Hamstring curl ball roll 15x each LE   BAPs board: -df/pf 10x -inversion/eversion 10x -clockwise 10x; counterclockwise 10x  -static hold 30 seconds  PATIENT EDUCATION: Education details: goals, POC  Person educated: Patient Education method: Explanation, Demonstration, Tactile cues, Verbal cues, and Handouts Education comprehension: verbalized understanding, returned demonstration, verbal cues required, and tactile cues required  HOME EXERCISE PROGRAM:  Access Code: [email or web address] URL: [email or web address] Date: 10/03/2023 Prepared by: Marliss Simple  Exercises - Seated Scapular Retraction  - 1 x daily - 7 x weekly - 2 sets - 10 reps - 5 hold - Neck Sidebending  - 1 x daily - 7 x weekly - 2 sets - 2 reps - 30 hold - Supine Isometric Shoulder Extension with Towel  - 1  x daily - 7 x weekly - 2 sets - 10 reps - 5 hold - Supine Isometric Shoulder Adduction with Towel Roll  - 1 x daily - 7 x weekly - 2 sets - 10 reps - 5 hold - Seated Bilateral Shoulder Flexion Towel Slide at Table Top  - 1 x daily - 7 x weekly - 1 sets - 3 reps - 30 hold - Supine Posterior Pelvic Tilt  - 1 x daily - 7 x weekly - 2 sets - 5 reps - 30 hold - Supine LE Neural Mobilization  - 1 x daily - 7 x weekly - 2 sets - 10 reps - 5 hold - Seated Ankle Alphabet  - 1 x daily - 7 x weekly - 2 sets - 1 reps - 5 hold  ASSESSMENT:  CLINICAL IMPRESSION:    Patient tolerates progressive strengthening and stabilization interventions. BAPS board tolerated but fatiguing resulting in need to rest. She does have some compensatory movements for L ankle weakness present throughout session.  Pt will continue to benefit from skilled therapy to address remaining deficits in order to improve overall QoL and return to PLOF.     OBJECTIVE IMPAIRMENTS: cardiopulmonary status limiting activity, decreased activity tolerance, decreased mobility, increased fascial restrictions, impaired perceived functional ability, increased muscle spasms, impaired flexibility, impaired UE functional use, improper body mechanics, postural dysfunction, and pain.   ACTIVITY LIMITATIONS: carrying, lifting, bed mobility, bathing, dressing, self feeding, reach over head, and caring for others  PARTICIPATION LIMITATIONS: meal prep, cleaning, laundry, personal finances, interpersonal relationship, driving, shopping, community activity, yard work, church, and caregiving for husband  PERSONAL FACTORS: Age, Fitness, Past/current experiences, Time since onset of injury/illness/exacerbation, and 3+ comorbidities: aorta type A dissection in sept 2011 with aortic valve replacement bypass of R coronary artery, AAA, anemia, anxiety, CAD, CHF, depression, GERD, Heart failure, HLD, HTN, IBS, Kidney problems, migraine, osteoarthritis, osteoporosis, RA, ocular migraines, back pain  are also affecting patient's functional outcome.   REHAB POTENTIAL: Fair    CLINICAL DECISION MAKING: Evolving/moderate complexity  EVALUATION COMPLEXITY: Moderate   GOALS: Goals reviewed with patient? Yes  SHORT TERM GOALS: Target date: 04/19/2024    Patient will be independent in home exercise program to improve strength/mobility for better functional independence with ADLs. Baseline:7/23: HEP given 9/16: compliant 1x/week 4/3: improving compliance Goal status: On going     LONG TERM GOALS: Target date:  05/17/2024    Patient will increase FOTO score to equal to or greater than  57%   to demonstrate statistically significant improvement in mobility and quality of life.  Baseline: 7/22: 46 9/16: 50% 10/7: 40% 12/10: 57% Goal status: MET  2.  Patient will improve shoulder AROM to > 140 degrees of flexion, scaption, and abduction for improved ability to perform overhead activities. Baseline: 7/22: see above 9/16: see above 10/7 : defer to exacerbation 12/10: shoulder abduction >140, flexion < 140 , assess scaption next visit 2/6: flexion >140, abduction >140 scaption >140 Goal status: MET  3.  Patient will demonstrate adequate shoulder ROM and strength to be able to shave and dress independently with pain less than 3/10. Baseline: 7/23: 6/10 pain with dressing, limited ROM, unable to test strength 9/16: 3/10 pain with dressing  Goal status: MET  4.  Patient will decrease Quick DASH score by > 8 points demonstrating reduced self-reported upper extremity disability for L and R shoulder  Baseline: 7/23: L shoulder 50% 9/16: R=54.54 % L 66% L 81% R 24% 12/10: R: 43.2%  L: 56.8% 2/6: L 22%  R 34%  Goal status: MET  5.  Patient will be able to eat an entire meal without resting arm demonstrating improved functional strength Baseline: 7/23: has to rest while eating due to pain. 9/16: able to make halfway through meal 10/7: can make it about halfway through meal, some meals can get 3/4s through meal 12/10: improved, still has to rest every once in a while 2/6: can get halfway to 2/3rds of the meal.  Goal status: deferred at this time to focus on back/leg  6.  Patient will be able to stand 15 minutes without her LLE going numb allowing for ADLS such as showering and iADLs such as cooking Baseline: 5 minutes 12/10: will assess next visit 2/6: 10 minutes 4/3: 10 minutes Goal status: IN PROGRESS  7. Patient will increase six minute walk test distance to >1500 for progression to community ambulator and  improve gait ability Baseline: 1160 ft  12/10: 1010 ft 2/6: 1140 ft  4/3: 1201 ft  Goal status: IN PROGRESS  8  Patient will reduce modified Oswestry score to <20 as to demonstrate minimal disability with ADLs including improved sleeping tolerance, walking/sitting tolerance etc for better mobility with ADLs.  Baseline: 2/6: 60% 4/3: 50%  Goal status: In progress  PLAN:  PT FREQUENCY: 2x/week  PT DURATION: 8 weeks  PLANNED INTERVENTIONS: Therapeutic exercises, Therapeutic activity, Neuromuscular re-education, Balance training, Gait training, Patient/Family education, Self Care, Joint mobilization, Vestibular training, Canalith repositioning, Visual/preceptual remediation/compensation, Dry Needling, Electrical stimulation, Spinal mobilization, Cryotherapy, Moist heat, Compression bandaging, scar mobilization, Splintting, Taping, Traction, Ultrasound, Biofeedback, Ionotophoresis 4mg /ml Dexamethasone , Manual therapy, and Re-evaluation  PLAN FOR NEXT SESSION:  Continue with LUE strengthening  L ankle strengthening, distraction, core activation, reassess scaption and flexion ROM, make HEP    Rachel Torres  Brain Cahill, PT, DPT Physical Therapist - [facility]  Outpatient Physical Therapy- Main Campus [phone]    04/23/24, 12:43 PM

## 2024-04-23 ENCOUNTER — Ambulatory Visit: Payer: Medicare HMO

## 2024-04-23 DIAGNOSIS — R2689 Other abnormalities of gait and mobility: Secondary | ICD-10-CM | POA: Diagnosis not present

## 2024-04-23 DIAGNOSIS — M25612 Stiffness of left shoulder, not elsewhere classified: Secondary | ICD-10-CM

## 2024-04-23 DIAGNOSIS — M5459 Other low back pain: Secondary | ICD-10-CM

## 2024-04-23 DIAGNOSIS — R293 Abnormal posture: Secondary | ICD-10-CM

## 2024-04-24 ENCOUNTER — Ambulatory Visit (INDEPENDENT_AMBULATORY_CARE_PROVIDER_SITE_OTHER): Payer: Medicare HMO | Admitting: Psychology

## 2024-04-24 DIAGNOSIS — F331 Major depressive disorder, recurrent, moderate: Secondary | ICD-10-CM | POA: Diagnosis not present

## 2024-04-24 DIAGNOSIS — H6063 Unspecified chronic otitis externa, bilateral: Secondary | ICD-10-CM | POA: Diagnosis not present

## 2024-04-24 DIAGNOSIS — G4733 Obstructive sleep apnea (adult) (pediatric): Secondary | ICD-10-CM | POA: Diagnosis not present

## 2024-04-24 DIAGNOSIS — H6123 Impacted cerumen, bilateral: Secondary | ICD-10-CM | POA: Diagnosis not present

## 2024-04-24 DIAGNOSIS — H9313 Tinnitus, bilateral: Secondary | ICD-10-CM | POA: Diagnosis not present

## 2024-04-24 NOTE — Progress Notes (Signed)
 [facility] Counselor/Therapist Progress Note  Patient ID: LUBERTHA WOOLMAN, MRN: [id number],    Date: 04/24/2024  Time Spent: 10:00am-10:50am  50 minutes   Treatment Type: Individual Therapy  Reported Symptoms: stress  Mental Status Exam: Appearance:  Casual     Behavior: Appropriate  Motor: Normal  Speech/Language:  Normal Rate  Affect: Appropriate  Mood: normal  Thought process: normal  Thought content:   WNL  Sensory/Perceptual disturbances:   WNL  Orientation: oriented to person, place, time/date, and situation  Attention: Good  Concentration: Good  Memory: WNL  Fund of knowledge:  Good  Insight:   Good  Judgment:  Good  Impulse Control: Good   Risk Assessment: Danger to Self:  No Self-injurious Behavior: No Danger to Others: No Duty to Warn:no Physical Aggression / Violence:No  Access to Firearms a concern: No  Gang Involvement:No   Subjective: Pt present for face-to-face individual therapy via video.  Pt consents to telehealth video session and is aware of limitations and benefits of virtual sessions.   Location of pt: home Location of therapist: home office.   Pt talked about her health.   Pt saw the doctor and he recommended pt get an ablation of her heart.  Pt is worried about her heart issue.   Pt still does not have an appointment for the procedure.  She will need extra help with caregivers for [name] while she recuperates from surgery.    Addressed the stress of caregiving for [name].  It is frustrating to deal with his short term memory loss.   Pt has to constantly repeat herself.   [name] does not seem to understand a lot of things which can be challenging.  Worked on Optician, dispensing. Pt talked about her family and her concerns about her mother.  Worked on self care strategies. Provided supportive therapy.    Interventions: Cognitive Behavioral Therapy and Insight-Oriented  Diagnosis:  F33.1   Plan of Care: Recommend ongoing therapy.   Pt  participated in setting treatment goals.   Pt needs a place to talk about stressors.  Plan to meet every week.  Pt agrees with treatment plan.    Treatment Plan (target date: 03/27/2025) Client Abilities/Strengths  Pt is bright, engaging, and motivated for therapy.   Client Treatment Preferences  Individual therapy.  Client Statement of Needs  Improve coping skills.  Have a place to talk about stressors.   Symptoms  Depressed or irritable mood. Unresolved grief issues.  Problems Addressed  Unipolar Depression Goals 1. Alleviate depressive symptoms and return to previous level of effective functioning. 2. Appropriately grieve the loss in order to normalize mood and to return to previously adaptive level of functioning. Objective Learn and implement behavioral strategies to overcome depression. Target Date: 2025-03-27 Frequency: weekly  Progress: 45 Modality: individual  Related Interventions Engage the client in "behavioral activation," increasing his/her activity level and contact with sources of reward, while identifying processes that inhibit activation.  Use behavioral techniques such as instruction, rehearsal, role-playing, role reversal, as needed, to facilitate activity in the client's daily life; reinforce success. Assist the client in developing skills that increase the likelihood of deriving pleasure from behavioral activation (e.g., assertiveness skills, developing an exercise plan, less internal/more external focus, increased social involvement); reinforce success. Objective Identify important people in life, past and present, and describe the quality, good and poor, of those relationships. Target Date: 2025-03-27 Frequency: weekly  Progress: 45 Modality: individual  Related Interventions Conduct Interpersonal Therapy beginning with the assessment  of the client's "interpersonal inventory" of important past and present relationships; develop a case formulation linking depression  to grief, interpersonal role disputes, role transitions, and/or interpersonal deficits). Objective Learn and implement problem-solving and decision-making skills. Target Date: 2025-03-27 Frequency: weekly  Progress: 45 Modality: individual  Related Interventions Conduct Problem-Solving Therapy using techniques such as psychoeducation, modeling, and role-playing to teach client problem-solving skills (i.e., defining a problem specifically, generating possible solutions, evaluating the pros and cons of each solution, selecting and implementing a plan of action, evaluating the efficacy of the plan, accepting or revising the plan); role-play application of the problem-solving skill to a real life issue. Encourage in the client the development of a positive problem orientation in which problems and solving them are viewed as a natural part of life and not something to be feared, despaired, or avoided. 3. Develop healthy interpersonal relationships that lead to the alleviation and help prevent the relapse of depression. 4. Develop healthy thinking patterns and beliefs about self, others, and the world that lead to the alleviation and help prevent the relapse of depression. 5. Recognize, accept, and cope with feelings of depression. Diagnosis F33.1  Conditions For Discharge Achievement of treatment goals and objectives   Willey Harrier, LCSW

## 2024-04-25 DIAGNOSIS — G5730 Lesion of lateral popliteal nerve, unspecified lower limb: Secondary | ICD-10-CM | POA: Diagnosis not present

## 2024-04-25 NOTE — Therapy (Signed)
 OUTPATIENT PHYSICAL THERAPY TREATMENT      Patient Name: Rachel Torres MRN: [id number] DOB:[birth year], 68 y.o., female Today's Date: 04/26/2024  END OF SESSION:  PT End of Session - 04/26/24 0927     Visit Number 33    Number of Visits 44    Date for PT Re-Evaluation 05/17/24    Authorization Type [employer]  Medicare HMO    Authorization Time Period 01/26/24-03/22/24    Progress Note Due on Visit 10    PT Start Time 0930    PT Stop Time 1014    PT Time Calculation (min) 44 min    Activity Tolerance Patient tolerated treatment well;No increased pain    Behavior During Therapy WFL for tasks assessed/performed                     Past Medical History:  Diagnosis Date   AAA (abdominal aortic aneurysm) (HCC)    a. Chronic w/o evidence of aneurysmal dil on CTA 01/2016.   Alcohol abuse    Allergy    Anemia    Anxiety    Asthma    CAD (coronary artery disease)    a. 08/2010 s/p CABG x 1 (VG->RCA) @ time of Ao dissection repair; b. 01/2016 Lexiscan  MV: mid antsept/apical defect w/ ? peri-infarct ischemia-->likely attenuation-->Med Rx.   Chewing difficulty    Chronic combined systolic (congestive) and diastolic (congestive) heart failure (HCC)    a. 2012 EF 30-35%; b. 04/2015 EF 40-45%; c. 01/2016 Echo: Ef 55-65%, nl AoV bioprosthesis, nl RV, nl PASP.   Constipation    Depression    Edema of both lower extremities    Gallbladder sludge    GERD (gastroesophageal reflux disease)    Heart failure (HCC)    Heart valve problem    History of Bicuspid Aortic Valve    a. 08/2010 s/p AVR @ time of Ao dissection repair; b. 01/2016 Echo: Ef 55-65%, nl AoV bioprosthesis, nl RV, nl PASP.   Hx of repair of dissecting thoracic aortic aneurysm, Stanford type A    a. 08/2010 s/p repair with AVR and VG->RCA; b. 01/2016 CTA: stable appearance of Asc Thoracic Aortic graft. Opacification of flase lumen of chronic abd Ao dissection w/ retrograde flow through lumbar arteries. No aneurysmal dil of  flase lumen.   Hyperlipidemia    Hypertension    Hypertensive heart disease    IBS (irritable bowel syndrome)    Internal hemorrhoids    Kidney problem    Marfan syndrome    pt denies   Migraine    Obstructive sleep apnea    Osteoarthritis    Osteoporosis    Palpitations    Pneumonia    RA (rheumatoid arthritis) (HCC)    a. Followed by Dr. Chaim Colony   Past Surgical History:  Procedure Laterality Date   ABDOMINAL AORTIC ANEURYSM REPAIR     CARDIAC CATHETERIZATION  2001   CARDIOVERSION N/A 04/04/2024   Procedure: CARDIOVERSION;  Surgeon: Devorah Fonder, MD;  Location: [facility] ORS;  Service: Cardiovascular;  Laterality: N/A;   CESAREAN SECTION  1986 & 1991   CHOLECYSTECTOMY     COLONOSCOPY     COLONOSCOPY WITH PROPOFOL  N/A 01/19/2024   Procedure: COLONOSCOPY WITH PROPOFOL ;  Surgeon: Luke Salaam, MD;  Location: [facility] ENDOSCOPY;  Service: Gastroenterology;  Laterality: N/A;   ESOPHAGOGASTRODUODENOSCOPY (EGD) WITH PROPOFOL  N/A 05/08/2020   Procedure: ESOPHAGOGASTRODUODENOSCOPY (EGD) WITH PROPOFOL ;  Surgeon: Irby Mannan, MD;  Location: [facility] ENDOSCOPY;  Service: Endoscopy;  Laterality:  N/A;   FRACTURE SURGERY Right    shoulder replacement   HEMORRHOID BANDING     ORIF DISTAL RADIUS FRACTURE Left    POLYPECTOMY  01/19/2024   Procedure: POLYPECTOMY;  Surgeon: Luke Salaam, MD;  Location: [facility] ENDOSCOPY;  Service: Gastroenterology;;   UPPER GASTROINTESTINAL ENDOSCOPY     Patient Active Problem List   Diagnosis Date Noted   Typical atrial flutter (HCC) 04/04/2024   Adenomatous polyp of colon 01/19/2024   Heart failure with improved ejection fraction (HFimpEF) (HCC) 01/18/2024   Abnormal metabolism 11/14/2023   Weight regain after inital weight loss 11/14/2023   Multiple closed tarsal fractures of left foot, sequela 07/12/2023   Arm contusion, right, sequela 07/12/2023   Lumbar disc disease 07/12/2023   PAF (paroxysmal atrial fibrillation) (HCC) 06/09/2023   Paresthesia  06/03/2023   Obesity, Beginning BMI 35.45 02/17/2023   Tinnitus 01/14/2023   Change in vision 01/14/2023   Encounter for screening colonoscopy 01/14/2023   Personal history of fall 01/14/2023   Problem related to unspecified psychosocial circumstances 12/28/2022   Person encountering health services to consult on behalf of another person 12/28/2022   Palpitation 12/10/2022   Fatigue 10/26/2022   B12 deficiency 10/26/2022   Right shoulder pain 09/15/2022   Depression 09/14/2022   Other constipation 05/12/2022   Medicare annual wellness visit, initial 07/17/2021   TMJ (dislocation of temporomandibular joint) 07/17/2021   Encounter for screening mammogram for breast cancer 06/05/2021   Diarrhea 04/11/2020   Epigastric pain 02/19/2020   Dark stools 02/19/2020   History of atrial fibrillation 02/05/2019   SOB (shortness of breath) 01/27/2019   Irregular surface of cornea 12/25/2018   Acute cough 09/08/2017   History of HPV infection 12/01/2016   Screening mammogram, encounter for 11/29/2016   Estrogen deficiency 11/29/2016   Encounter for routine gynecological examination 11/29/2016   Grade III hemorrhoids 10/06/2016   Tachycardia 08/14/2016   Hemorrhoids 08/13/2016   Atypical migraine 08/03/2016   CAD (coronary artery disease)    AAA (abdominal aortic aneurysm) (HCC)    Chronic combined systolic (congestive) and diastolic (congestive) heart failure (HCC)    Prediabetes 02/11/2016   Generalized anxiety disorder 12/08/2015   Panic attacks 07/28/2015   Chest pain 07/09/2013   Sensorineural hearing loss 03/27/2013   Vitamin D  deficiency 07/03/2012   H/O aortic dissection 05/12/2012   Routine general medical examination at a health care facility 09/20/2011   Atypical chest pain 09/15/2011   S/P AVR (aortic valve replacement) 01/07/2011   CORONARY ARTERY BYPASS GRAFT, HX OF 01/07/2011   Arthritis 12/20/2010   Back pain 12/15/2010   H/O dissecting abdominal aortic aneurysm repair  08/24/2010   IRRITABLE BOWEL SYNDROME 09/09/2009   Obstructive sleep apnea 08/04/2009   External hemorrhoid 06/26/2009   Osteoporosis 01/09/2009   Hyperlipidemia 07/26/2007   Depression with anxiety 07/26/2007   Essential hypertension 07/26/2007   Allergic rhinitis 07/26/2007   Asthma 07/26/2007   GERD 07/26/2007   Osteoarthritis 07/26/2007   Ocular migraine 07/26/2007   Mild intermittent asthma without complication 07/26/2007    PCP: Clemens Curt MD   REFERRING PROVIDER: Bert Britain  REFERRING DIAG: L rotator cuff   THERAPY DIAG:  Abnormal posture  Other abnormalities of gait and mobility  Other low back pain  Stiffness of left shoulder, not elsewhere classified  Rationale for Evaluation and Treatment: Rehabilitation  ONSET DATE: Feb 2024  SUBJECTIVE:  SUBJECTIVE STATEMENT:   Patient saw the physician yesterday. Was told she could have surgery to release the nerve for her L foot drop. Has to get her heart under control first.   Hand dominance: Left  PERTINENT HISTORY: Cartha Porras is a 66yoF familiar to our services. Pt referred to PT for strengthening of chronic left shoulder DJD, known remote RC injury. Recently fell and broke four metatarsals, since cleared from use of CAM rocker. She was treated successfully in past for right shoulder pain and has past medical history of Right shoulder surgery replacement. 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 PMH aorta type A dissection in sept 2011 s/p aortic valve replacement, bypass of R coronary artery, anemia, anxiety, CAD, CHF, depression, GERD, Heart failure, HLD, HTN, IBS, migraine, osteoarthritis, osteoporosis, RA, back pain.    Back pain:  Patient has new referral for radiculopathy, lumbar region. MRI showed degeneration of L1-2  and L2-3 with some disc space collapse and minor retrolisthesis of L2-3. Issue began in February, felt numbness in L thigh. No cause known, but she was at a museum and noticed it was numb. Sometimes L foot numbness and toe numbness of 2nd and 3rd toe causing tripping. This caused her to trip July 4th and break five metatarsals. Some minor pain in the low pain noted. Pain noted to be 2-3/10 Pain in hip 3-4/10 of L hip.    PAIN:  Are you having pain? Yes, 3/10 pain in lateral deltoids in the shoulder.  PRECAUTIONS: None  FALLS:  Has patient fallen in last 6 months? Yes. Number of falls 2  PATIENT GOALS:to lift more than 3lb with LUE, be able to eat a meal without arm being tired.    OBJECTIVE:  DIAGNOSTIC FINDINGS:  IMPRESSION: Mild degenerative change in the cervical spine without acute fracture or traumatic subluxation.   IMPRESSION: Full width, full width tears of the supraspinatus and infraspinatus tendons with retraction to the glenoid. Grade 2/3 infraspinatus atrophy.   Moderate tendinosis of the distal subscapularis tendon with articular sided fraying of the cephalad fibers.   Moderate intra-articular long head biceps tendinosis with fraying and possible partial tearing.   Mild glenohumeral and moderate AC joint osteoarthritis.   Degenerative superior labral tearing anteriorly and posteriorly.   PATIENT SURVEYS:  Quick Dash 50%  and FOTO 46%   COGNITION: Overall cognitive status: Within functional limits for tasks assessed                                  SENSATION: WFL   POSTURE: Slight rounded shoulders and shoulder elevation    UPPER EXTREMITY ROM:    Active ROM Right eval 9/16: recert RUE Left eval 9/16 RECERT LUE  Shoulder flexion 64 122 91 92  Shoulder extension        Shoulder abduction 95 88 52 82  Shoulder adduction        Shoulder internal rotation Unable to test at 90 61 degrees 5 40  Shoulder external rotation 18at side of body -5 degrees at  90 degrees 34 35  Elbow flexion [facility]  WFL   Elbow extension [facility]  WFL   Wrist flexion        Wrist extension        Wrist ulnar deviation        Wrist radial deviation        Wrist pronation        Wrist  supination        (Blank rows = not tested)   UPPER EXTREMITY MMT:   MMT Right eval Left eval  Shoulder flexion      Shoulder extension      Shoulder abduction      Shoulder adduction      Shoulder internal rotation      Shoulder external rotation      Middle trapezius      Lower trapezius      Elbow flexion      Elbow extension      Wrist flexion      Wrist extension      Wrist ulnar deviation      Wrist radial deviation      Wrist pronation      Wrist supination      Grip strength (lbs) 30 35  (Blank rows = not tested) Unable to test strength due to pain with motion   TRUNK ROM: Trunk Flexion 65  Trunk Extension 7  Trunk R SB Limited 25%  Trunk L SB WFL  Trunk R rotation WFL  Trunk L rotation Limited 25%    Ankle ROM Seated: Motion: AROM seated Right Left  Plantarflexion 4+ 3  Dorsiflexion 4+ 4  Inversion 4 3+  Eversion 4+ 3+    SHOULDER SPECIAL TESTS: Impingement tests:  unable to test due to pain in starting position SLAP lesions:  unable to test due to pain in starting position Instability tests:  unable to test due to pain in starting position Rotator cuff assessment:  unable to test due to pain in starting position Biceps assessment:  unable to test due to pain in starting position   LOW BACK SPECIAL TEST: SLR: negative  Scour test: negative FABER: negative Pelvic compression/distraction: negative  Slump test positive LLE  6 min walk test: 1160 ft increased left foot drag and antalgic gait pattern with foot eversion    JOINT MOBILITY TESTING:  AP: painful PA: painful Inferior: painful Distraction: reduces pain   Back mobilization hypomobile and painful to grade II mobilizations    PALPATION:  Tenderness to all joint regions of GH      TODAY'S TREATMENT: DATE: 04/26/24    TherEx Supine: -bring leg on top of other leg into figure four position 10x;  -hamstring stretch 60 seconds each LE -single knee to chest 60 seconds each LE -figure four stretch 60 seconds each LE  -nerve glide 25x each LE -posterior pelvic tilt 10x -bridge 10x; 2 sets  -scapular retraction 15x   Sidelying: -thoracic rotation 10x10 second holds  Seated: Adduction squeeze 15x  LAQ with adduction squeeze 15x  Hamstring curl ball roll 15x each LE   BAPs board: -df/pf 10x -inversion/eversion 10x -clockwise 10x; counterclockwise 10x  -static hold 30 seconds x  2 trials   Trigger Point Dry Needling  Subsequent Treatment: Instructions provided previously at initial dry needling treatment.   Patient Verbal Consent Given: Yes Education Handout Provided: Previously Provided Muscles Treated: L peroneal tendons Electrical Stimulation Performed: No Treatment Response/Outcome: good twitch response     PATIENT EDUCATION: Education details: goals, POC  Person educated: Patient Education method: Explanation, Demonstration, Tactile cues, Verbal cues, and Handouts Education comprehension: verbalized understanding, returned demonstration, verbal cues required, and tactile cues required  HOME EXERCISE PROGRAM:  Access Code: [email or web address] URL: [email or web address] Date: 10/03/2023 Prepared by: Kaspar Albornoz  Exercises - Seated Scapular Retraction  - 1 x daily - 7 x weekly - 2 sets - 10 reps -  5 hold - Neck Sidebending  - 1 x daily - 7 x weekly - 2 sets - 2 reps - 30 hold - Supine Isometric Shoulder Extension with Towel  - 1 x daily - 7 x weekly - 2 sets - 10 reps - 5 hold - Supine Isometric Shoulder Adduction with Towel Roll  - 1 x daily - 7 x weekly - 2 sets - 10 reps - 5 hold - Seated Bilateral Shoulder Flexion Towel Slide at Table Top  - 1 x daily - 7 x weekly - 1 sets - 3 reps - 30 hold - Supine Posterior Pelvic Tilt  - 1 x  daily - 7 x weekly - 2 sets - 5 reps - 30 hold - Supine LE Neural Mobilization  - 1 x daily - 7 x weekly - 2 sets - 10 reps - 5 hold - Seated Ankle Alphabet  - 1 x daily - 7 x weekly - 2 sets - 1 reps - 5 hold  ASSESSMENT:  CLINICAL IMPRESSION:   Patient tolerates TDN of L peroneal tendons with reported radiating relief of symptoms. L foot/ankle strength continues to be an area of progress. Patient has decreased back pain with thoracic rotations  Pt will continue to benefit from skilled therapy to address remaining deficits in order to improve overall QoL and return to PLOF.     OBJECTIVE IMPAIRMENTS: cardiopulmonary status limiting activity, decreased activity tolerance, decreased mobility, increased fascial restrictions, impaired perceived functional ability, increased muscle spasms, impaired flexibility, impaired UE functional use, improper body mechanics, postural dysfunction, and pain.   ACTIVITY LIMITATIONS: carrying, lifting, bed mobility, bathing, dressing, self feeding, reach over head, and caring for others  PARTICIPATION LIMITATIONS: meal prep, cleaning, laundry, personal finances, interpersonal relationship, driving, shopping, community activity, yard work, church, and caregiving for husband  PERSONAL FACTORS: Age, Fitness, Past/current experiences, Time since onset of injury/illness/exacerbation, and 3+ comorbidities: aorta type A dissection in sept 2011 with aortic valve replacement bypass of R coronary artery, AAA, anemia, anxiety, CAD, CHF, depression, GERD, Heart failure, HLD, HTN, IBS, Kidney problems, migraine, osteoarthritis, osteoporosis, RA, ocular migraines, back pain are also affecting patient's functional outcome.   REHAB POTENTIAL: Fair    CLINICAL DECISION MAKING: Evolving/moderate complexity  EVALUATION COMPLEXITY: Moderate   GOALS: Goals reviewed with patient? Yes  SHORT TERM GOALS: Target date: 04/19/2024    Patient will be independent in home exercise  program to improve strength/mobility for better functional independence with ADLs. Baseline:7/23: HEP given 9/16: compliant 1x/week 4/3: improving compliance Goal status: On going     LONG TERM GOALS: Target date: 05/17/2024    Patient will increase FOTO score to equal to or greater than  57%   to demonstrate statistically significant improvement in mobility and quality of life.  Baseline: 7/22: 46 9/16: 50% 10/7: 40% 12/10: 57% Goal status: MET  2.  Patient will improve shoulder AROM to > 140 degrees of flexion, scaption, and abduction for improved ability to perform overhead activities. Baseline: 7/22: see above 9/16: see above 10/7 : defer to exacerbation 12/10: shoulder abduction >140, flexion < 140 , assess scaption next visit 2/6: flexion >140, abduction >140 scaption >140 Goal status: MET  3.  Patient will demonstrate adequate shoulder ROM and strength to be able to shave and dress independently with pain less than 3/10. Baseline: 7/23: 6/10 pain with dressing, limited ROM, unable to test strength 9/16: 3/10 pain with dressing  Goal status: MET  4.  Patient will decrease Quick DASH score  by > 8 points demonstrating reduced self-reported upper extremity disability for L and R shoulder  Baseline: 7/23: L shoulder 50% 9/16: R=54.54 % L 66% L 81% R 24% 12/10: R: 43.2% L: 56.8% 2/6: L 22%  R 34%  Goal status: MET  5.  Patient will be able to eat an entire meal without resting arm demonstrating improved functional strength Baseline: 7/23: has to rest while eating due to pain. 9/16: able to make halfway through meal 10/7: can make it about halfway through meal, some meals can get 3/4s through meal 12/10: improved, still has to rest every once in a while 2/6: can get halfway to 2/3rds of the meal.  Goal status: deferred at this time to focus on back/leg  6.  Patient will be able to stand 15 minutes without her LLE going numb allowing for ADLS such as showering and iADLs such as  cooking Baseline: 5 minutes 12/10: will assess next visit 2/6: 10 minutes 4/3: 10 minutes Goal status: IN PROGRESS  7. Patient will increase six minute walk test distance to >1500 for progression to community ambulator and improve gait ability Baseline: 1160 ft  12/10: 1010 ft 2/6: 1140 ft  4/3: 1201 ft  Goal status: IN PROGRESS  8  Patient will reduce modified Oswestry score to <20 as to demonstrate minimal disability with ADLs including improved sleeping tolerance, walking/sitting tolerance etc for better mobility with ADLs.  Baseline: 2/6: 60% 4/3: 50%  Goal status: In progress  PLAN:  PT FREQUENCY: 2x/week  PT DURATION: 8 weeks  PLANNED INTERVENTIONS: Therapeutic exercises, Therapeutic activity, Neuromuscular re-education, Balance training, Gait training, Patient/Family education, Self Care, Joint mobilization, Vestibular training, Canalith repositioning, Visual/preceptual remediation/compensation, Dry Needling, Electrical stimulation, Spinal mobilization, Cryotherapy, Moist heat, Compression bandaging, scar mobilization, Splintting, Taping, Traction, Ultrasound, Biofeedback, Ionotophoresis 4mg /ml Dexamethasone , Manual therapy, and Re-evaluation  PLAN FOR NEXT SESSION:  Continue with LUE strengthening  L ankle strengthening, distraction, core activation, reassess scaption and flexion ROM, make HEP    Charlette Hennings  Brain Cahill, PT, DPT Physical Therapist - [facility]  Outpatient Physical Therapy- Main Campus [phone]    04/26/24, 10:14 AM

## 2024-04-26 ENCOUNTER — Telehealth: Payer: Self-pay | Admitting: Cardiology

## 2024-04-26 ENCOUNTER — Ambulatory Visit: Payer: Medicare HMO

## 2024-04-26 DIAGNOSIS — R293 Abnormal posture: Secondary | ICD-10-CM

## 2024-04-26 DIAGNOSIS — M5459 Other low back pain: Secondary | ICD-10-CM | POA: Diagnosis not present

## 2024-04-26 DIAGNOSIS — M25612 Stiffness of left shoulder, not elsewhere classified: Secondary | ICD-10-CM | POA: Diagnosis not present

## 2024-04-26 DIAGNOSIS — R2689 Other abnormalities of gait and mobility: Secondary | ICD-10-CM

## 2024-04-26 NOTE — Telephone Encounter (Signed)
 Pt came by and had her echo moved up to 5/16, wants to schedule ablation. Please advise.

## 2024-04-26 NOTE — Therapy (Incomplete)
 OUTPATIENT PHYSICAL THERAPY TREATMENT      Patient Name: Rachel Torres MRN: [id number] DOB:06/30/1956, 68 y.o., female Today's Date: 04/26/2024  END OF SESSION:            Past Medical History:  Diagnosis Date   AAA (abdominal aortic aneurysm) (HCC)    a. Chronic w/o evidence of aneurysmal dil on CTA 01/2016.   Alcohol abuse    Allergy    Anemia    Anxiety    Asthma    CAD (coronary artery disease)    a. 08/2010 s/p CABG x 1 (VG->RCA) @ time of Ao dissection repair; b. 01/2016 Lexiscan  MV: mid antsept/apical defect w/ ? peri-infarct ischemia-->likely attenuation-->Med Rx.   Chewing difficulty    Chronic combined systolic (congestive) and diastolic (congestive) heart failure (HCC)    a. 2012 EF 30-35%; b. 04/2015 EF 40-45%; c. 01/2016 Echo: Ef 55-65%, nl AoV bioprosthesis, nl RV, nl PASP.   Constipation    Depression    Edema of both lower extremities    Gallbladder sludge    GERD (gastroesophageal reflux disease)    Heart failure (HCC)    Heart valve problem    History of Bicuspid Aortic Valve    a. 08/2010 s/p AVR @ time of Ao dissection repair; b. 01/2016 Echo: Ef 55-65%, nl AoV bioprosthesis, nl RV, nl PASP.   Hx of repair of dissecting thoracic aortic aneurysm, Stanford type A    a. 08/2010 s/p repair with AVR and VG->RCA; b. 01/2016 CTA: stable appearance of Asc Thoracic Aortic graft. Opacification of flase lumen of chronic abd Ao dissection w/ retrograde flow through lumbar arteries. No aneurysmal dil of flase lumen.   Hyperlipidemia    Hypertension    Hypertensive heart disease    IBS (irritable bowel syndrome)    Internal hemorrhoids    Kidney problem    Marfan syndrome    pt denies   Migraine    Obstructive sleep apnea    Osteoarthritis    Osteoporosis    Palpitations    Pneumonia    RA (rheumatoid arthritis) (HCC)    a. Followed by Dr. Chaim Colony   Past Surgical History:  Procedure Laterality Date   ABDOMINAL AORTIC ANEURYSM REPAIR     CARDIAC  CATHETERIZATION  2001   CARDIOVERSION N/A 04/04/2024   Procedure: CARDIOVERSION;  Surgeon: Devorah Fonder, MD;  Location: [facility] ORS;  Service: Cardiovascular;  Laterality: N/A;   CESAREAN SECTION  1986 & 1991   CHOLECYSTECTOMY     COLONOSCOPY     COLONOSCOPY WITH PROPOFOL  N/A 01/19/2024   Procedure: COLONOSCOPY WITH PROPOFOL ;  Surgeon: Luke Salaam, MD;  Location: [facility] ENDOSCOPY;  Service: Gastroenterology;  Laterality: N/A;   ESOPHAGOGASTRODUODENOSCOPY (EGD) WITH PROPOFOL  N/A 05/08/2020   Procedure: ESOPHAGOGASTRODUODENOSCOPY (EGD) WITH PROPOFOL ;  Surgeon: Irby Mannan, MD;  Location: [facility] ENDOSCOPY;  Service: Endoscopy;  Laterality: N/A;   FRACTURE SURGERY Right    shoulder replacement   HEMORRHOID BANDING     ORIF DISTAL RADIUS FRACTURE Left    POLYPECTOMY  01/19/2024   Procedure: POLYPECTOMY;  Surgeon: Luke Salaam, MD;  Location: [facility] ENDOSCOPY;  Service: Gastroenterology;;   UPPER GASTROINTESTINAL ENDOSCOPY     Patient Active Problem List   Diagnosis Date Noted   Typical atrial flutter (HCC) 04/04/2024   Adenomatous polyp of colon 01/19/2024   Heart failure with improved ejection fraction (HFimpEF) (HCC) 01/18/2024   Abnormal metabolism 11/14/2023   Weight regain after inital weight loss 11/14/2023   Multiple closed tarsal  fractures of left foot, sequela 07/12/2023   Arm contusion, right, sequela 07/12/2023   Lumbar disc disease 07/12/2023   PAF (paroxysmal atrial fibrillation) (HCC) 06/09/2023   Paresthesia 06/03/2023   Obesity, Beginning BMI 35.45 02/17/2023   Tinnitus 01/14/2023   Change in vision 01/14/2023   Encounter for screening colonoscopy 01/14/2023   Personal history of fall 01/14/2023   Problem related to unspecified psychosocial circumstances 12/28/2022   Person encountering health services to consult on behalf of another person 12/28/2022   Palpitation 12/10/2022   Fatigue 10/26/2022   B12 deficiency 10/26/2022   Right shoulder pain 09/15/2022    Depression 09/14/2022   Other constipation 05/12/2022   Medicare annual wellness visit, initial 07/17/2021   TMJ (dislocation of temporomandibular joint) 07/17/2021   Encounter for screening mammogram for breast cancer 06/05/2021   Diarrhea 04/11/2020   Epigastric pain 02/19/2020   Dark stools 02/19/2020   History of atrial fibrillation 02/05/2019   SOB (shortness of breath) 01/27/2019   Irregular surface of cornea 12/25/2018   Acute cough 09/08/2017   History of HPV infection 12/01/2016   Screening mammogram, encounter for 11/29/2016   Estrogen deficiency 11/29/2016   Encounter for routine gynecological examination 11/29/2016   Grade III hemorrhoids 10/06/2016   Tachycardia 08/14/2016   Hemorrhoids 08/13/2016   Atypical migraine 08/03/2016   CAD (coronary artery disease)    AAA (abdominal aortic aneurysm) (HCC)    Chronic combined systolic (congestive) and diastolic (congestive) heart failure (HCC)    Prediabetes 02/11/2016   Generalized anxiety disorder 12/08/2015   Panic attacks 07/28/2015   Chest pain 07/09/2013   Sensorineural hearing loss 03/27/2013   Vitamin D  deficiency 07/03/2012   H/O aortic dissection 05/12/2012   Routine general medical examination at a health care facility 09/20/2011   Atypical chest pain 09/15/2011   S/P AVR (aortic valve replacement) 01/07/2011   CORONARY ARTERY BYPASS GRAFT, HX OF 01/07/2011   Arthritis 12/20/2010   Back pain 12/15/2010   H/O dissecting abdominal aortic aneurysm repair 08/24/2010   IRRITABLE BOWEL SYNDROME 09/09/2009   Obstructive sleep apnea 08/04/2009   External hemorrhoid 06/26/2009   Osteoporosis 01/09/2009   Hyperlipidemia 07/26/2007   Depression with anxiety 07/26/2007   Essential hypertension 07/26/2007   Allergic rhinitis 07/26/2007   Asthma 07/26/2007   GERD 07/26/2007   Osteoarthritis 07/26/2007   Ocular migraine 07/26/2007   Mild intermittent asthma without complication 07/26/2007    PCP: Clemens Curt  MD   REFERRING PROVIDER: Bert Britain  REFERRING DIAG: L rotator cuff   THERAPY DIAG:  No diagnosis found.  Rationale for Evaluation and Treatment: Rehabilitation  ONSET DATE: Feb 2024  SUBJECTIVE:  SUBJECTIVE STATEMENT:   ***  Hand dominance: Left  PERTINENT HISTORY: Nakaylah Tirpak is a 66yoF familiar to our services. Pt referred to PT for strengthening of chronic left shoulder DJD, known remote RC injury. Recently fell and broke four metatarsals, since cleared from use of CAM rocker. She was treated successfully in past for right shoulder pain and has past medical history of Right shoulder surgery replacement. 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 PMH aorta type A dissection in sept 2011 s/p aortic valve replacement, bypass of R coronary artery, anemia, anxiety, CAD, CHF, depression, GERD, Heart failure, HLD, HTN, IBS, migraine, osteoarthritis, osteoporosis, RA, back pain.    Back pain:  Patient has new referral for radiculopathy, lumbar region. MRI showed degeneration of L1-2 and L2-3 with some disc space collapse and minor retrolisthesis of L2-3. Issue began in February, felt numbness in L thigh. No cause known, but she was at a museum and noticed it was numb. Sometimes L foot numbness and toe numbness of 2nd and 3rd toe causing tripping. This caused her to trip July 4th and break five metatarsals. Some minor pain in the low pain noted. Pain noted to be 2-3/10 Pain in hip 3-4/10 of L hip.    PAIN:  Are you having pain? Yes, 3/10 pain in lateral deltoids in the shoulder.  PRECAUTIONS: None  FALLS:  Has patient fallen in last 6 months? Yes. Number of falls 2  PATIENT GOALS:to lift more than 3lb with LUE, be able to eat a meal without arm being tired.    OBJECTIVE:  DIAGNOSTIC FINDINGS:   IMPRESSION: Mild degenerative change in the cervical spine without acute fracture or traumatic subluxation.   IMPRESSION: Full width, full width tears of the supraspinatus and infraspinatus tendons with retraction to the glenoid. Grade 2/3 infraspinatus atrophy.   Moderate tendinosis of the distal subscapularis tendon with articular sided fraying of the cephalad fibers.   Moderate intra-articular long head biceps tendinosis with fraying and possible partial tearing.   Mild glenohumeral and moderate AC joint osteoarthritis.   Degenerative superior labral tearing anteriorly and posteriorly.   PATIENT SURVEYS:  Quick Dash 50%  and FOTO 46%   COGNITION: Overall cognitive status: Within functional limits for tasks assessed                                  SENSATION: WFL   POSTURE: Slight rounded shoulders and shoulder elevation    UPPER EXTREMITY ROM:    Active ROM Right eval 9/16: recert RUE Left eval 9/16 RECERT LUE  Shoulder flexion 64 122 91 92  Shoulder extension        Shoulder abduction 95 88 52 82  Shoulder adduction        Shoulder internal rotation Unable to test at 90 61 degrees 5 40  Shoulder external rotation 18at side of body -5 degrees at 90 degrees 34 35  Elbow flexion [facility]  WFL   Elbow extension [facility]  WFL   Wrist flexion        Wrist extension        Wrist ulnar deviation        Wrist radial deviation        Wrist pronation        Wrist supination        (Blank rows = not tested)   UPPER EXTREMITY MMT:   MMT Right eval Left eval  Shoulder flexion  Shoulder extension      Shoulder abduction      Shoulder adduction      Shoulder internal rotation      Shoulder external rotation      Middle trapezius      Lower trapezius      Elbow flexion      Elbow extension      Wrist flexion      Wrist extension      Wrist ulnar deviation      Wrist radial deviation      Wrist pronation      Wrist supination      Grip strength (lbs) 30 35   (Blank rows = not tested) Unable to test strength due to pain with motion   TRUNK ROM: Trunk Flexion 65  Trunk Extension 7  Trunk R SB Limited 25%  Trunk L SB WFL  Trunk R rotation WFL  Trunk L rotation Limited 25%    Ankle ROM Seated: Motion: AROM seated Right Left  Plantarflexion 4+ 3  Dorsiflexion 4+ 4  Inversion 4 3+  Eversion 4+ 3+    SHOULDER SPECIAL TESTS: Impingement tests:  unable to test due to pain in starting position SLAP lesions:  unable to test due to pain in starting position Instability tests:  unable to test due to pain in starting position Rotator cuff assessment:  unable to test due to pain in starting position Biceps assessment:  unable to test due to pain in starting position   LOW BACK SPECIAL TEST: SLR: negative  Scour test: negative FABER: negative Pelvic compression/distraction: negative  Slump test positive LLE  6 min walk test: 1160 ft increased left foot drag and antalgic gait pattern with foot eversion    JOINT MOBILITY TESTING:  AP: painful PA: painful Inferior: painful Distraction: reduces pain   Back mobilization hypomobile and painful to grade II mobilizations    PALPATION:  Tenderness to all joint regions of GH     TODAY'S TREATMENT: DATE: 04/26/24    TherEx Supine: -bring leg on top of other leg into figure four position 10x;  -hamstring stretch 60 seconds each LE -single knee to chest 60 seconds each LE -figure four stretch 60 seconds each LE  -nerve glide 25x each LE -posterior pelvic tilt 10x -bridge 10x; 2 sets  -scapular retraction 15x   Sidelying: -thoracic rotation 10x10 second holds  Seated: Adduction squeeze 15x  LAQ with adduction squeeze 15x  Hamstring curl ball roll 15x each LE   BAPs board: -df/pf 10x -inversion/eversion 10x -clockwise 10x; counterclockwise 10x  -static hold 30 seconds x  2 trials   Trigger Point Dry Needling  Subsequent Treatment: Instructions provided previously at  initial dry needling treatment.   Patient Verbal Consent Given: Yes Education Handout Provided: Previously Provided Muscles Treated: L peroneal tendons Electrical Stimulation Performed: No Treatment Response/Outcome: good twitch response     PATIENT EDUCATION: Education details: goals, POC  Person educated: Patient Education method: Explanation, Demonstration, Tactile cues, Verbal cues, and Handouts Education comprehension: verbalized understanding, returned demonstration, verbal cues required, and tactile cues required  HOME EXERCISE PROGRAM:  Access Code: [email or web address] URL: [email or web address] Date: 10/03/2023 Prepared by: Brogan Martis  Exercises - Seated Scapular Retraction  - 1 x daily - 7 x weekly - 2 sets - 10 reps - 5 hold - Neck Sidebending  - 1 x daily - 7 x weekly - 2 sets - 2 reps - 30 hold - Supine Isometric Shoulder Extension with Towel  -  1 x daily - 7 x weekly - 2 sets - 10 reps - 5 hold - Supine Isometric Shoulder Adduction with Towel Roll  - 1 x daily - 7 x weekly - 2 sets - 10 reps - 5 hold - Seated Bilateral Shoulder Flexion Towel Slide at Table Top  - 1 x daily - 7 x weekly - 1 sets - 3 reps - 30 hold - Supine Posterior Pelvic Tilt  - 1 x daily - 7 x weekly - 2 sets - 5 reps - 30 hold - Supine LE Neural Mobilization  - 1 x daily - 7 x weekly - 2 sets - 10 reps - 5 hold - Seated Ankle Alphabet  - 1 x daily - 7 x weekly - 2 sets - 1 reps - 5 hold  ASSESSMENT:  CLINICAL IMPRESSION:   *** Pt will continue to benefit from skilled therapy to address remaining deficits in order to improve overall QoL and return to PLOF.     OBJECTIVE IMPAIRMENTS: cardiopulmonary status limiting activity, decreased activity tolerance, decreased mobility, increased fascial restrictions, impaired perceived functional ability, increased muscle spasms, impaired flexibility, impaired UE functional use, improper body mechanics, postural dysfunction, and pain.   ACTIVITY  LIMITATIONS: carrying, lifting, bed mobility, bathing, dressing, self feeding, reach over head, and caring for others  PARTICIPATION LIMITATIONS: meal prep, cleaning, laundry, personal finances, interpersonal relationship, driving, shopping, community activity, yard work, church, and caregiving for husband  PERSONAL FACTORS: Age, Fitness, Past/current experiences, Time since onset of injury/illness/exacerbation, and 3+ comorbidities: aorta type A dissection in sept 2011 with aortic valve replacement bypass of R coronary artery, AAA, anemia, anxiety, CAD, CHF, depression, GERD, Heart failure, HLD, HTN, IBS, Kidney problems, migraine, osteoarthritis, osteoporosis, RA, ocular migraines, back pain are also affecting patient's functional outcome.   REHAB POTENTIAL: Fair    CLINICAL DECISION MAKING: Evolving/moderate complexity  EVALUATION COMPLEXITY: Moderate   GOALS: Goals reviewed with patient? Yes  SHORT TERM GOALS: Target date: 04/19/2024    Patient will be independent in home exercise program to improve strength/mobility for better functional independence with ADLs. Baseline:7/23: HEP given 9/16: compliant 1x/week 4/3: improving compliance Goal status: On going     LONG TERM GOALS: Target date: 05/17/2024    Patient will increase FOTO score to equal to or greater than  57%   to demonstrate statistically significant improvement in mobility and quality of life.  Baseline: 7/22: 46 9/16: 50% 10/7: 40% 12/10: 57% Goal status: MET  2.  Patient will improve shoulder AROM to > 140 degrees of flexion, scaption, and abduction for improved ability to perform overhead activities. Baseline: 7/22: see above 9/16: see above 10/7 : defer to exacerbation 12/10: shoulder abduction >140, flexion < 140 , assess scaption next visit 2/6: flexion >140, abduction >140 scaption >140 Goal status: MET  3.  Patient will demonstrate adequate shoulder ROM and strength to be able to shave and dress  independently with pain less than 3/10. Baseline: 7/23: 6/10 pain with dressing, limited ROM, unable to test strength 9/16: 3/10 pain with dressing  Goal status: MET  4.  Patient will decrease Quick DASH score by > 8 points demonstrating reduced self-reported upper extremity disability for L and R shoulder  Baseline: 7/23: L shoulder 50% 9/16: R=54.54 % L 66% L 81% R 24% 12/10: R: 43.2% L: 56.8% 2/6: L 22%  R 34%  Goal status: MET  5.  Patient will be able to eat an entire meal without resting arm demonstrating improved functional  strength Baseline: 7/23: has to rest while eating due to pain. 9/16: able to make halfway through meal 10/7: can make it about halfway through meal, some meals can get 3/4s through meal 12/10: improved, still has to rest every once in a while 2/6: can get halfway to 2/3rds of the meal.  Goal status: deferred at this time to focus on back/leg  6.  Patient will be able to stand 15 minutes without her LLE going numb allowing for ADLS such as showering and iADLs such as cooking Baseline: 5 minutes 12/10: will assess next visit 2/6: 10 minutes 4/3: 10 minutes Goal status: IN PROGRESS  7. Patient will increase six minute walk test distance to >1500 for progression to community ambulator and improve gait ability Baseline: 1160 ft  12/10: 1010 ft 2/6: 1140 ft  4/3: 1201 ft  Goal status: IN PROGRESS  8  Patient will reduce modified Oswestry score to <20 as to demonstrate minimal disability with ADLs including improved sleeping tolerance, walking/sitting tolerance etc for better mobility with ADLs.  Baseline: 2/6: 60% 4/3: 50%  Goal status: In progress  PLAN:  PT FREQUENCY: 2x/week  PT DURATION: 8 weeks  PLANNED INTERVENTIONS: Therapeutic exercises, Therapeutic activity, Neuromuscular re-education, Balance training, Gait training, Patient/Family education, Self Care, Joint mobilization, Vestibular training, Canalith repositioning, Visual/preceptual  remediation/compensation, Dry Needling, Electrical stimulation, Spinal mobilization, Cryotherapy, Moist heat, Compression bandaging, scar mobilization, Splintting, Taping, Traction, Ultrasound, Biofeedback, Ionotophoresis 4mg /ml Dexamethasone , Manual therapy, and Re-evaluation  PLAN FOR NEXT SESSION:  Continue with LUE strengthening  L ankle strengthening, distraction, core activation, reassess scaption and flexion ROM, make HEP    Jaydis Duchene  Brain Cahill, PT, DPT Physical Therapist - [facility]  Outpatient Physical Therapy- [facility] [phone]    04/26/24, 5:09 PM

## 2024-04-27 NOTE — Telephone Encounter (Signed)
 Left message for patient to call back

## 2024-04-27 NOTE — Telephone Encounter (Signed)
 Spoke with the patient and scheduled her for an ablation in August and an appointment with Dr. Marven Slimmer in June to discuss.

## 2024-04-27 NOTE — Telephone Encounter (Signed)
 Patient is returning call.

## 2024-04-30 ENCOUNTER — Ambulatory Visit: Payer: Medicare HMO

## 2024-05-01 ENCOUNTER — Ambulatory Visit (INDEPENDENT_AMBULATORY_CARE_PROVIDER_SITE_OTHER): Payer: Medicare HMO | Admitting: Psychology

## 2024-05-01 DIAGNOSIS — F331 Major depressive disorder, recurrent, moderate: Secondary | ICD-10-CM

## 2024-05-01 NOTE — Progress Notes (Signed)
 [facility] Counselor/Therapist Progress Note  Patient ID: HENRINE SIM, MRN: [id number],    Date: 05/01/2024  Time Spent: 10:00am-10:50am  50 minutes   Treatment Type: Individual Therapy  Reported Symptoms: stress  Mental Status Exam: Appearance:  Casual     Behavior: Appropriate  Motor: Normal  Speech/Language:  Normal Rate  Affect: Appropriate  Mood: normal  Thought process: normal  Thought content:   WNL  Sensory/Perceptual disturbances:   WNL  Orientation: oriented to person, place, time/date, and situation  Attention: Good  Concentration: Good  Memory: WNL  Fund of knowledge:  Good  Insight:   Good  Judgment:  Good  Impulse Control: Good   Risk Assessment: Danger to Self:  No Self-injurious Behavior: No Danger to Others: No Duty to Warn:no Physical Aggression / Violence:No  Access to Firearms a concern: No  Gang Involvement:No   Subjective: Pt present for face-to-face individual therapy via video.  Pt consents to telehealth video session and is aware of limitations and benefits of virtual sessions.   Location of pt: home Location of therapist: home office.   Pt talked about the sister of pt's best friend in high school passing away recently.   Pt is going to go to the funeral this week.  Addressed the loss and how it impacts pt.  Helped pt process her feelings and grief.   Pt talked about her health.  Pt has her heart ablation scheduled for August 5th.  She has to arrange for care givers at that time.  Pt will not be able to lift anything for a week.  Addressed the stress of caregiving for [name].  Pt states [name] got mad at her this weekend and would not talk to her for a day.  Addressed the interaction.  [name] did not understand why he could not do something and pt had to set a limit.  It is frustrating to deal with his short term memory loss.    [name] does not seem to understand a lot of things which can be challenging.  Worked on Engineer, technical sales. Worked on self care strategies. Provided supportive therapy.    Interventions: Cognitive Behavioral Therapy and Insight-Oriented  Diagnosis:  F33.1   Plan of Care: Recommend ongoing therapy.   Pt participated in setting treatment goals.   Pt needs a place to talk about stressors.  Plan to meet every week.  Pt agrees with treatment plan.    Treatment Plan (target date: 03/27/2025) Client Abilities/Strengths  Pt is bright, engaging, and motivated for therapy.   Client Treatment Preferences  Individual therapy.  Client Statement of Needs  Improve coping skills.  Have a place to talk about stressors.   Symptoms  Depressed or irritable mood. Unresolved grief issues.  Problems Addressed  Unipolar Depression Goals 1. Alleviate depressive symptoms and return to previous level of effective functioning. 2. Appropriately grieve the loss in order to normalize mood and to return to previously adaptive level of functioning. Objective Learn and implement behavioral strategies to overcome depression. Target Date: 2025-03-27 Frequency: weekly  Progress: 45 Modality: individual  Related Interventions Engage the client in "behavioral activation," increasing his/her activity level and contact with sources of reward, while identifying processes that inhibit activation.  Use behavioral techniques such as instruction, rehearsal, role-playing, role reversal, as needed, to facilitate activity in the client's daily life; reinforce success. Assist the client in developing skills that increase the likelihood of deriving pleasure from behavioral activation (e.g., assertiveness skills, developing an exercise plan,  less internal/more external focus, increased social involvement); reinforce success. Objective Identify important people in life, past and present, and describe the quality, good and poor, of those relationships. Target Date: 2025-03-27 Frequency: weekly  Progress: 45 Modality: individual   Related Interventions Conduct Interpersonal Therapy beginning with the assessment of the client's "interpersonal inventory" of important past and present relationships; develop a case formulation linking depression to grief, interpersonal role disputes, role transitions, and/or interpersonal deficits). Objective Learn and implement problem-solving and decision-making skills. Target Date: 2025-03-27 Frequency: weekly  Progress: 45 Modality: individual  Related Interventions Conduct Problem-Solving Therapy using techniques such as psychoeducation, modeling, and role-playing to teach client problem-solving skills (i.e., defining a problem specifically, generating possible solutions, evaluating the pros and cons of each solution, selecting and implementing a plan of action, evaluating the efficacy of the plan, accepting or revising the plan); role-play application of the problem-solving skill to a real life issue. Encourage in the client the development of a positive problem orientation in which problems and solving them are viewed as a natural part of life and not something to be feared, despaired, or avoided. 3. Develop healthy interpersonal relationships that lead to the alleviation and help prevent the relapse of depression. 4. Develop healthy thinking patterns and beliefs about self, others, and the world that lead to the alleviation and help prevent the relapse of depression. 5. Recognize, accept, and cope with feelings of depression. Diagnosis F33.1  Conditions For Discharge Achievement of treatment goals and objectives   Willey Harrier, LCSW

## 2024-05-02 ENCOUNTER — Other Ambulatory Visit: Payer: Self-pay | Admitting: Cardiovascular Disease

## 2024-05-02 ENCOUNTER — Other Ambulatory Visit: Payer: Self-pay | Admitting: Family Medicine

## 2024-05-03 ENCOUNTER — Ambulatory Visit: Payer: Medicare HMO | Admitting: Physical Therapy

## 2024-05-04 ENCOUNTER — Ambulatory Visit: Attending: Cardiology

## 2024-05-04 DIAGNOSIS — I480 Paroxysmal atrial fibrillation: Secondary | ICD-10-CM

## 2024-05-04 DIAGNOSIS — I5022 Chronic systolic (congestive) heart failure: Secondary | ICD-10-CM | POA: Diagnosis not present

## 2024-05-04 LAB — ECHOCARDIOGRAM COMPLETE
AR max vel: 1.63 cm2
AV Area VTI: 1.58 cm2
AV Area mean vel: 1.63 cm2
AV Mean grad: 5 mmHg
AV Peak grad: 10.2 mmHg
Ao pk vel: 1.6 m/s
Area-P 1/2: 3.91 cm2
Calc EF: 56 %
S' Lateral: 4.2 cm
Single Plane A2C EF: 49.5 %
Single Plane A4C EF: 57.6 %

## 2024-05-06 ENCOUNTER — Ambulatory Visit: Payer: Self-pay | Admitting: Cardiology

## 2024-05-07 ENCOUNTER — Ambulatory Visit: Payer: Medicare HMO | Admitting: Physical Therapy

## 2024-05-07 DIAGNOSIS — M5459 Other low back pain: Secondary | ICD-10-CM | POA: Diagnosis not present

## 2024-05-07 DIAGNOSIS — M25612 Stiffness of left shoulder, not elsewhere classified: Secondary | ICD-10-CM

## 2024-05-07 DIAGNOSIS — R293 Abnormal posture: Secondary | ICD-10-CM | POA: Diagnosis not present

## 2024-05-07 DIAGNOSIS — R2689 Other abnormalities of gait and mobility: Secondary | ICD-10-CM | POA: Diagnosis not present

## 2024-05-07 NOTE — Therapy (Signed)
 OUTPATIENT PHYSICAL THERAPY TREATMENT      Patient Name: Rachel Torres MRN: [id number] DOB:December 16, 1956, 68 y.o., female Today's Date: 05/07/2024  END OF SESSION:  PT End of Session - 05/07/24 1522     Visit Number 34    Number of Visits 44    Date for PT Re-Evaluation 05/17/24    Authorization Type [employer]  Medicare HMO    Authorization Time Period 01/26/24-03/22/24    Progress Note Due on Visit 10    PT Start Time 1535    PT Stop Time 1615    PT Time Calculation (min) 40 min    Activity Tolerance Patient tolerated treatment well;No increased pain    Behavior During Therapy WFL for tasks assessed/performed                      Past Medical History:  Diagnosis Date   AAA (abdominal aortic aneurysm) (HCC)    a. Chronic w/o evidence of aneurysmal dil on CTA 01/2016.   Alcohol abuse    Allergy    Anemia    Anxiety    Asthma    CAD (coronary artery disease)    a. 08/2010 s/p CABG x 1 (VG->RCA) @ time of Ao dissection repair; b. 01/2016 Lexiscan  MV: mid antsept/apical defect w/ ? peri-infarct ischemia-->likely attenuation-->Med Rx.   Chewing difficulty    Chronic combined systolic (congestive) and diastolic (congestive) heart failure (HCC)    a. 2012 EF 30-35%; b. 04/2015 EF 40-45%; c. 01/2016 Echo: Ef 55-65%, nl AoV bioprosthesis, nl RV, nl PASP.   Constipation    Depression    Edema of both lower extremities    Gallbladder sludge    GERD (gastroesophageal reflux disease)    Heart failure (HCC)    Heart valve problem    History of Bicuspid Aortic Valve    a. 08/2010 s/p AVR @ time of Ao dissection repair; b. 01/2016 Echo: Ef 55-65%, nl AoV bioprosthesis, nl RV, nl PASP.   Hx of repair of dissecting thoracic aortic aneurysm, Stanford type A    a. 08/2010 s/p repair with AVR and VG->RCA; b. 01/2016 CTA: stable appearance of Asc Thoracic Aortic graft. Opacification of flase lumen of chronic abd Ao dissection w/ retrograde flow through lumbar arteries. No aneurysmal dil of  flase lumen.   Hyperlipidemia    Hypertension    Hypertensive heart disease    IBS (irritable bowel syndrome)    Internal hemorrhoids    Kidney problem    Marfan syndrome    pt denies   Migraine    Obstructive sleep apnea    Osteoarthritis    Osteoporosis    Palpitations    Pneumonia    RA (rheumatoid arthritis) (HCC)    a. Followed by Dr. Chaim Colony   Past Surgical History:  Procedure Laterality Date   ABDOMINAL AORTIC ANEURYSM REPAIR     CARDIAC CATHETERIZATION  2001   CARDIOVERSION N/A 04/04/2024   Procedure: CARDIOVERSION;  Surgeon: Devorah Fonder, MD;  Location: [facility] ORS;  Service: Cardiovascular;  Laterality: N/A;   CESAREAN SECTION  1986 & 1991   CHOLECYSTECTOMY     COLONOSCOPY     COLONOSCOPY WITH PROPOFOL  N/A 01/19/2024   Procedure: COLONOSCOPY WITH PROPOFOL ;  Surgeon: Luke Salaam, MD;  Location: [facility] ENDOSCOPY;  Service: Gastroenterology;  Laterality: N/A;   ESOPHAGOGASTRODUODENOSCOPY (EGD) WITH PROPOFOL  N/A 05/08/2020   Procedure: ESOPHAGOGASTRODUODENOSCOPY (EGD) WITH PROPOFOL ;  Surgeon: Irby Mannan, MD;  Location: [facility] ENDOSCOPY;  Service: Endoscopy;  Laterality: N/A;   FRACTURE SURGERY Right    shoulder replacement   HEMORRHOID BANDING     ORIF DISTAL RADIUS FRACTURE Left    POLYPECTOMY  01/19/2024   Procedure: POLYPECTOMY;  Surgeon: Luke Salaam, MD;  Location: [facility] ENDOSCOPY;  Service: Gastroenterology;;   UPPER GASTROINTESTINAL ENDOSCOPY     Patient Active Problem List   Diagnosis Date Noted   Typical atrial flutter (HCC) 04/04/2024   Adenomatous polyp of colon 01/19/2024   Heart failure with improved ejection fraction (HFimpEF) (HCC) 01/18/2024   Abnormal metabolism 11/14/2023   Weight regain after inital weight loss 11/14/2023   Multiple closed tarsal fractures of left foot, sequela 07/12/2023   Arm contusion, right, sequela 07/12/2023   Lumbar disc disease 07/12/2023   PAF (paroxysmal atrial fibrillation) (HCC) 06/09/2023   Paresthesia  06/03/2023   Obesity, Beginning BMI 35.45 02/17/2023   Tinnitus 01/14/2023   Change in vision 01/14/2023   Encounter for screening colonoscopy 01/14/2023   Personal history of fall 01/14/2023   Problem related to unspecified psychosocial circumstances 12/28/2022   Person encountering health services to consult on behalf of another person 12/28/2022   Palpitation 12/10/2022   Fatigue 10/26/2022   B12 deficiency 10/26/2022   Right shoulder pain 09/15/2022   Depression 09/14/2022   Other constipation 05/12/2022   Medicare annual wellness visit, initial 07/17/2021   TMJ (dislocation of temporomandibular joint) 07/17/2021   Encounter for screening mammogram for breast cancer 06/05/2021   Diarrhea 04/11/2020   Epigastric pain 02/19/2020   Dark stools 02/19/2020   History of atrial fibrillation 02/05/2019   SOB (shortness of breath) 01/27/2019   Irregular surface of cornea 12/25/2018   Acute cough 09/08/2017   History of HPV infection 12/01/2016   Screening mammogram, encounter for 11/29/2016   Estrogen deficiency 11/29/2016   Encounter for routine gynecological examination 11/29/2016   Grade III hemorrhoids 10/06/2016   Tachycardia 08/14/2016   Hemorrhoids 08/13/2016   Atypical migraine 08/03/2016   CAD (coronary artery disease)    AAA (abdominal aortic aneurysm) (HCC)    Chronic combined systolic (congestive) and diastolic (congestive) heart failure (HCC)    Prediabetes 02/11/2016   Generalized anxiety disorder 12/08/2015   Panic attacks 07/28/2015   Chest pain 07/09/2013   Sensorineural hearing loss 03/27/2013   Vitamin D  deficiency 07/03/2012   H/O aortic dissection 05/12/2012   Routine general medical examination at a health care facility 09/20/2011   Atypical chest pain 09/15/2011   S/P AVR (aortic valve replacement) 01/07/2011   CORONARY ARTERY BYPASS GRAFT, HX OF 01/07/2011   Arthritis 12/20/2010   Back pain 12/15/2010   H/O dissecting abdominal aortic aneurysm repair  08/24/2010   IRRITABLE BOWEL SYNDROME 09/09/2009   Obstructive sleep apnea 08/04/2009   External hemorrhoid 06/26/2009   Osteoporosis 01/09/2009   Hyperlipidemia 07/26/2007   Depression with anxiety 07/26/2007   Essential hypertension 07/26/2007   Allergic rhinitis 07/26/2007   Asthma 07/26/2007   GERD 07/26/2007   Osteoarthritis 07/26/2007   Ocular migraine 07/26/2007   Mild intermittent asthma without complication 07/26/2007    PCP: Clemens Curt MD   REFERRING PROVIDER: Bert Britain  REFERRING DIAG: L rotator cuff   THERAPY DIAG:  Abnormal posture  Other abnormalities of gait and mobility  Other low back pain  Stiffness of left shoulder, not elsewhere classified  Rationale for Evaluation and Treatment: Rehabilitation  ONSET DATE: Feb 2024  SUBJECTIVE:  SUBJECTIVE STATEMENT:   Pt reports numbness on both sides now. Reports continued improvements in shoulder function.   Hand dominance: Left  PERTINENT HISTORY: Deborh Pense is a 66yoF familiar to our services. Pt referred to PT for strengthening of chronic left shoulder DJD, known remote RC injury. Recently fell and broke four metatarsals, since cleared from use of CAM rocker. She was treated successfully in past for right shoulder pain and has past medical history of Right shoulder surgery replacement. 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 PMH aorta type A dissection in sept 2011 s/p aortic valve replacement, bypass of R coronary artery, anemia, anxiety, CAD, CHF, depression, GERD, Heart failure, HLD, HTN, IBS, migraine, osteoarthritis, osteoporosis, RA, back pain.    Back pain:  Patient has new referral for radiculopathy, lumbar region. MRI showed degeneration of L1-2 and L2-3 with some disc space collapse and minor  retrolisthesis of L2-3. Issue began in February, felt numbness in L thigh. No cause known, but she was at a museum and noticed it was numb. Sometimes L foot numbness and toe numbness of 2nd and 3rd toe causing tripping. This caused her to trip July 4th and break five metatarsals. Some minor pain in the low pain noted. Pain noted to be 2-3/10 Pain in hip 3-4/10 of L hip.    PAIN:  Are you having pain? Yes, 3/10 pain in lateral deltoids in the shoulder.  PRECAUTIONS: None  FALLS:  Has patient fallen in last 6 months? Yes. Number of falls 2  PATIENT GOALS:to lift more than 3lb with LUE, be able to eat a meal without arm being tired.    OBJECTIVE:  DIAGNOSTIC FINDINGS:  IMPRESSION: Mild degenerative change in the cervical spine without acute fracture or traumatic subluxation.   IMPRESSION: Full width, full width tears of the supraspinatus and infraspinatus tendons with retraction to the glenoid. Grade 2/3 infraspinatus atrophy.   Moderate tendinosis of the distal subscapularis tendon with articular sided fraying of the cephalad fibers.   Moderate intra-articular long head biceps tendinosis with fraying and possible partial tearing.   Mild glenohumeral and moderate AC joint osteoarthritis.   Degenerative superior labral tearing anteriorly and posteriorly.   PATIENT SURVEYS:  Quick Dash 50%  and FOTO 46%   COGNITION: Overall cognitive status: Within functional limits for tasks assessed                                  SENSATION: WFL   POSTURE: Slight rounded shoulders and shoulder elevation    UPPER EXTREMITY ROM:    Active ROM Right eval 9/16: recert RUE Left eval 9/16 RECERT LUE  Shoulder flexion 64 122 91 92  Shoulder extension        Shoulder abduction 95 88 52 82  Shoulder adduction        Shoulder internal rotation Unable to test at 90 61 degrees 5 40  Shoulder external rotation 18at side of body -5 degrees at 90 degrees 34 35  Elbow flexion [facility]  WFL    Elbow extension [facility]  WFL   Wrist flexion        Wrist extension        Wrist ulnar deviation        Wrist radial deviation        Wrist pronation        Wrist supination        (Blank rows = not tested)  UPPER EXTREMITY MMT:   MMT Right eval Left eval  Shoulder flexion      Shoulder extension      Shoulder abduction      Shoulder adduction      Shoulder internal rotation      Shoulder external rotation      Middle trapezius      Lower trapezius      Elbow flexion      Elbow extension      Wrist flexion      Wrist extension      Wrist ulnar deviation      Wrist radial deviation      Wrist pronation      Wrist supination      Grip strength (lbs) 30 35  (Blank rows = not tested) Unable to test strength due to pain with motion   TRUNK ROM: Trunk Flexion 65  Trunk Extension 7  Trunk R SB Limited 25%  Trunk L SB WFL  Trunk R rotation WFL  Trunk L rotation Limited 25%    Ankle ROM Seated: Motion: AROM seated Right Left  Plantarflexion 4+ 3  Dorsiflexion 4+ 4  Inversion 4 3+  Eversion 4+ 3+    SHOULDER SPECIAL TESTS: Impingement tests:  unable to test due to pain in starting position SLAP lesions:  unable to test due to pain in starting position Instability tests:  unable to test due to pain in starting position Rotator cuff assessment:  unable to test due to pain in starting position Biceps assessment:  unable to test due to pain in starting position   LOW BACK SPECIAL TEST: SLR: negative  Scour test: negative FABER: negative Pelvic compression/distraction: negative  Slump test positive LLE  6 min walk test: 1160 ft increased left foot drag and antalgic gait pattern with foot eversion    JOINT MOBILITY TESTING:  AP: painful PA: painful Inferior: painful Distraction: reduces pain   Back mobilization hypomobile and painful to grade II mobilizations    PALPATION:  Tenderness to all joint regions of GH     TODAY'S TREATMENT: DATE: 05/07/24     TherEx Supine: -bring leg on top of other leg into figure four position x 30 sec ea   -figure four stretch 60 seconds each LE  -trA contraction 10 x 5 sec  -bridge 10x; 1 sets, min pain noted Childs pose x 30 sec   Manual STM to bilateral gluteal musculature x 15 min with ischemic release to muscles around piriformis and gluteus medius area. Pt reports some relief in back spasms.   TE Sidelying: -thoracic rotation 10x10 second holds  NMR: BAPs board: -df/pf 10x -inversion/eversion 10x -clockwise 10x; counterclockwise 10x  -static hold 30 seconds x  2 trials       PATIENT EDUCATION: Education details: goals, POC  Person educated: Patient Education method: Explanation, Demonstration, Tactile cues, Verbal cues, and Handouts Education comprehension: verbalized understanding, returned demonstration, verbal cues required, and tactile cues required  HOME EXERCISE PROGRAM:  Access Code: [email or web address] URL: [email or web address] Date: 10/03/2023 Prepared by: Marina  Moser  Exercises - Seated Scapular Retraction  - 1 x daily - 7 x weekly - 2 sets - 10 reps - 5 hold - Neck Sidebending  - 1 x daily - 7 x weekly - 2 sets - 2 reps - 30 hold - Supine Isometric Shoulder Extension with Towel  - 1 x daily - 7 x weekly - 2 sets - 10 reps - 5 hold - Supine Isometric Shoulder Adduction  with Towel Roll  - 1 x daily - 7 x weekly - 2 sets - 10 reps - 5 hold - Seated Bilateral Shoulder Flexion Towel Slide at Table Top  - 1 x daily - 7 x weekly - 1 sets - 3 reps - 30 hold - Supine Posterior Pelvic Tilt  - 1 x daily - 7 x weekly - 2 sets - 5 reps - 30 hold - Supine LE Neural Mobilization  - 1 x daily - 7 x weekly - 2 sets - 10 reps - 5 hold - Seated Ankle Alphabet  - 1 x daily - 7 x weekly - 2 sets - 1 reps - 5 hold  ASSESSMENT:  CLINICAL IMPRESSION:   Patient presents with continued discomfort in her low back as well as discomfort that protrudes into her bilateral hips with some  radiation into her posterior thighs.  Physical therapy focused on core strengthening, hip muscle relaxation and pain alleviation, as well as some ankle neuromuscular reeducation utilizing biomechanical ankle platform system. Pt will continue to benefit from skilled therapy to address remaining deficits in order to improve overall QoL and return to PLOF.     OBJECTIVE IMPAIRMENTS: cardiopulmonary status limiting activity, decreased activity tolerance, decreased mobility, increased fascial restrictions, impaired perceived functional ability, increased muscle spasms, impaired flexibility, impaired UE functional use, improper body mechanics, postural dysfunction, and pain.   ACTIVITY LIMITATIONS: carrying, lifting, bed mobility, bathing, dressing, self feeding, reach over head, and caring for others  PARTICIPATION LIMITATIONS: meal prep, cleaning, laundry, personal finances, interpersonal relationship, driving, shopping, community activity, yard work, church, and caregiving for husband  PERSONAL FACTORS: Age, Fitness, Past/current experiences, Time since onset of injury/illness/exacerbation, and 3+ comorbidities: aorta type A dissection in sept 2011 with aortic valve replacement bypass of R coronary artery, AAA, anemia, anxiety, CAD, CHF, depression, GERD, Heart failure, HLD, HTN, IBS, Kidney problems, migraine, osteoarthritis, osteoporosis, RA, ocular migraines, back pain are also affecting patient's functional outcome.   REHAB POTENTIAL: Fair    CLINICAL DECISION MAKING: Evolving/moderate complexity  EVALUATION COMPLEXITY: Moderate   GOALS: Goals reviewed with patient? Yes  SHORT TERM GOALS: Target date: 04/19/2024    Patient will be independent in home exercise program to improve strength/mobility for better functional independence with ADLs. Baseline:7/23: HEP given 9/16: compliant 1x/week 4/3: improving compliance Goal status: On going     LONG TERM GOALS: Target date:  05/17/2024    Patient will increase FOTO score to equal to or greater than  57%   to demonstrate statistically significant improvement in mobility and quality of life.  Baseline: 7/22: 46 9/16: 50% 10/7: 40% 12/10: 57% Goal status: MET  2.  Patient will improve shoulder AROM to > 140 degrees of flexion, scaption, and abduction for improved ability to perform overhead activities. Baseline: 7/22: see above 9/16: see above 10/7 : defer to exacerbation 12/10: shoulder abduction >140, flexion < 140 , assess scaption next visit 2/6: flexion >140, abduction >140 scaption >140 Goal status: MET  3.  Patient will demonstrate adequate shoulder ROM and strength to be able to shave and dress independently with pain less than 3/10. Baseline: 7/23: 6/10 pain with dressing, limited ROM, unable to test strength 9/16: 3/10 pain with dressing  Goal status: MET  4.  Patient will decrease Quick DASH score by > 8 points demonstrating reduced self-reported upper extremity disability for L and R shoulder  Baseline: 7/23: L shoulder 50% 9/16: R=54.54 % L 66% L 81% R 24% 12/10: R: 43.2% L:  56.8% 2/6: L 22%  R 34%  Goal status: MET  5.  Patient will be able to eat an entire meal without resting arm demonstrating improved functional strength Baseline: 7/23: has to rest while eating due to pain. 9/16: able to make halfway through meal 10/7: can make it about halfway through meal, some meals can get 3/4s through meal 12/10: improved, still has to rest every once in a while 2/6: can get halfway to 2/3rds of the meal.  Goal status: deferred at this time to focus on back/leg  6.  Patient will be able to stand 15 minutes without her LLE going numb allowing for ADLS such as showering and iADLs such as cooking Baseline: 5 minutes 12/10: will assess next visit 2/6: 10 minutes 4/3: 10 minutes Goal status: IN PROGRESS  7. Patient will increase six minute walk test distance to >1500 for progression to community ambulator and  improve gait ability Baseline: 1160 ft  12/10: 1010 ft 2/6: 1140 ft  4/3: 1201 ft  Goal status: IN PROGRESS  8  Patient will reduce modified Oswestry score to <20 as to demonstrate minimal disability with ADLs including improved sleeping tolerance, walking/sitting tolerance etc for better mobility with ADLs.  Baseline: 2/6: 60% 4/3: 50%  Goal status: In progress  PLAN:  PT FREQUENCY: 2x/week  PT DURATION: 8 weeks  PLANNED INTERVENTIONS: Therapeutic exercises, Therapeutic activity, Neuromuscular re-education, Balance training, Gait training, Patient/Family education, Self Care, Joint mobilization, Vestibular training, Canalith repositioning, Visual/preceptual remediation/compensation, Dry Needling, Electrical stimulation, Spinal mobilization, Cryotherapy, Moist heat, Compression bandaging, scar mobilization, Splintting, Taping, Traction, Ultrasound, Biofeedback, Ionotophoresis 4mg /ml Dexamethasone , Manual therapy, and Re-evaluation  PLAN FOR NEXT SESSION:  Continue with LUE strengthening  L ankle strengthening, distraction, core activation, reassess scaption and flexion ROM, make HEP    Edwina Gram PT ,DPT Physical Therapist- [facility]  [facility]    05/07/24, 4:02 PM

## 2024-05-08 ENCOUNTER — Ambulatory Visit (INDEPENDENT_AMBULATORY_CARE_PROVIDER_SITE_OTHER): Payer: Medicare HMO | Admitting: Psychology

## 2024-05-08 DIAGNOSIS — F331 Major depressive disorder, recurrent, moderate: Secondary | ICD-10-CM | POA: Diagnosis not present

## 2024-05-08 NOTE — Progress Notes (Signed)
 [facility] Counselor/Therapist Progress Note  Patient ID: Rachel Torres, MRN: [id number],    Date: 05/08/2024  Time Spent: 10:00am-10:50am  50 minutes   Treatment Type: Individual Therapy  Reported Symptoms: stress, overwhelm  Mental Status Exam: Appearance:  Casual     Behavior: Appropriate  Motor: Normal  Speech/Language:  Normal Rate  Affect: Appropriate  Mood: normal  Thought process: normal  Thought content:   WNL  Sensory/Perceptual disturbances:   WNL  Orientation: oriented to person, place, time/date, and situation  Attention: Good  Concentration: Good  Memory: WNL  Fund of knowledge:  Good  Insight:   Good  Judgment:  Good  Impulse Control: Good   Risk Assessment: Danger to Self:  No Self-injurious Behavior: No Danger to Others: No Duty to Warn:no Physical Aggression / Violence:No  Access to Firearms a concern: No  Gang Involvement:No   Subjective: Pt present for face-to-face individual therapy via video.  Pt consents to telehealth video session and is aware of limitations and benefits of virtual sessions.   Location of pt: home Location of therapist: home office.   Pt talked about going to a care giver group meeting.  It was helpful to here what other people experience and that she is not alone in how she is feeling.   Addressed the stress of caregiving for [name].     [name] does not seem to understand a lot of things which can be challenging.  Worked on Optician, dispensing.  Pt is worried [name] may need 24 hour care soon.  She wants to keep him home with more care givers instead of having [name] placed in a nursing home.  [facility] get [facility] care giver hours.  Pt feels like she needs a 2 week respite and is trying to look for a memory care unit for [name] for 2 weeks.   Pt talked about [name] daughter [name] getting arrested for shop lifting.   Addressed the family issues and how they impact pt.   Worked on self care  strategies. Provided supportive therapy.    Interventions: Cognitive Behavioral Therapy and Insight-Oriented  Diagnosis:  F33.1   Plan of Care: Recommend ongoing therapy.   Pt participated in setting treatment goals.   Pt needs a place to talk about stressors.  Plan to meet every week.  Pt agrees with treatment plan.    Treatment Plan (target date: 03/27/2025) Client Abilities/Strengths  Pt is bright, engaging, and motivated for therapy.   Client Treatment Preferences  Individual therapy.  Client Statement of Needs  Improve coping skills.  Have a place to talk about stressors.   Symptoms  Depressed or irritable mood. Unresolved grief issues.  Problems Addressed  Unipolar Depression Goals 1. Alleviate depressive symptoms and return to previous level of effective functioning. 2. Appropriately grieve the loss in order to normalize mood and to return to previously adaptive level of functioning. Objective Learn and implement behavioral strategies to overcome depression. Target Date: 2025-03-27 Frequency: weekly  Progress: 45 Modality: individual  Related Interventions Engage the client in "behavioral activation," increasing his/her activity level and contact with sources of reward, while identifying processes that inhibit activation.  Use behavioral techniques such as instruction, rehearsal, role-playing, role reversal, as needed, to facilitate activity in the client's daily life; reinforce success. Assist the client in developing skills that increase the likelihood of deriving pleasure from behavioral activation (e.g., assertiveness skills, developing an exercise plan, less internal/more external focus, increased social involvement); reinforce  success. Objective Identify important people in life, past and present, and describe the quality, good and poor, of those relationships. Target Date: 2025-03-27 Frequency: weekly  Progress: 45 Modality: individual  Related Interventions Conduct  Interpersonal Therapy beginning with the assessment of the client's "interpersonal inventory" of important past and present relationships; develop a case formulation linking depression to grief, interpersonal role disputes, role transitions, and/or interpersonal deficits). Objective Learn and implement problem-solving and decision-making skills. Target Date: 2025-03-27 Frequency: weekly  Progress: 45 Modality: individual  Related Interventions Conduct Problem-Solving Therapy using techniques such as psychoeducation, modeling, and role-playing to teach client problem-solving skills (i.e., defining a problem specifically, generating possible solutions, evaluating the pros and cons of each solution, selecting and implementing a plan of action, evaluating the efficacy of the plan, accepting or revising the plan); role-play application of the problem-solving skill to a real life issue. Encourage in the client the development of a positive problem orientation in which problems and solving them are viewed as a natural part of life and not something to be feared, despaired, or avoided. 3. Develop healthy interpersonal relationships that lead to the alleviation and help prevent the relapse of depression. 4. Develop healthy thinking patterns and beliefs about self, others, and the world that lead to the alleviation and help prevent the relapse of depression. 5. Recognize, accept, and cope with feelings of depression. Diagnosis F33.1  Conditions For Discharge Achievement of treatment goals and objectives   Willey Harrier, LCSW

## 2024-05-09 ENCOUNTER — Encounter: Payer: Self-pay | Admitting: Cardiology

## 2024-05-09 ENCOUNTER — Other Ambulatory Visit: Payer: Self-pay | Admitting: Cardiology

## 2024-05-09 ENCOUNTER — Ambulatory Visit: Attending: Cardiology | Admitting: Cardiology

## 2024-05-09 VITALS — BP 118/70 | HR 74 | Ht 65.0 in | Wt 219.4 lb

## 2024-05-09 DIAGNOSIS — Z8679 Personal history of other diseases of the circulatory system: Secondary | ICD-10-CM | POA: Diagnosis not present

## 2024-05-09 DIAGNOSIS — I483 Typical atrial flutter: Secondary | ICD-10-CM | POA: Diagnosis not present

## 2024-05-09 DIAGNOSIS — Z952 Presence of prosthetic heart valve: Secondary | ICD-10-CM

## 2024-05-09 DIAGNOSIS — Z79899 Other long term (current) drug therapy: Secondary | ICD-10-CM | POA: Diagnosis not present

## 2024-05-09 DIAGNOSIS — I5032 Chronic diastolic (congestive) heart failure: Secondary | ICD-10-CM

## 2024-05-09 DIAGNOSIS — D6869 Other thrombophilia: Secondary | ICD-10-CM | POA: Diagnosis not present

## 2024-05-09 DIAGNOSIS — I10 Essential (primary) hypertension: Secondary | ICD-10-CM | POA: Diagnosis not present

## 2024-05-09 DIAGNOSIS — I714 Abdominal aortic aneurysm, without rupture, unspecified: Secondary | ICD-10-CM | POA: Diagnosis not present

## 2024-05-09 DIAGNOSIS — I480 Paroxysmal atrial fibrillation: Secondary | ICD-10-CM | POA: Diagnosis not present

## 2024-05-09 DIAGNOSIS — I5022 Chronic systolic (congestive) heart failure: Secondary | ICD-10-CM | POA: Diagnosis not present

## 2024-05-09 DIAGNOSIS — I25118 Atherosclerotic heart disease of native coronary artery with other forms of angina pectoris: Secondary | ICD-10-CM | POA: Diagnosis not present

## 2024-05-09 MED ORDER — DAPAGLIFLOZIN PROPANEDIOL 10 MG PO TABS: 10.0000 mg | ORAL_TABLET | Freq: Every day | ORAL | 3 refills | Status: DC

## 2024-05-09 NOTE — Progress Notes (Signed)
 Cardiology Office Note:  .   Date:  05/09/2024  ID:  Rachel Torres, DOB 1956/04/12, MRN [id number] PCP: Clemens Curt, MD  [facility] Providers Cardiologist:  Belva Boyden, MD Electrophysiologist:  Boyce Byes, MD    History of Present Illness: .   Rachel Torres is a 68 y.o. female with a past medical history of aortic dissection type a (08/2010), aortic valve replacement (bicuspid aortic valve at [facility]), coronary disease status post CABG x 1 (SVG-RCA), HFrEF with recovered ejection fraction, paroxysmal atrial fibrillation, obstructive sleep apnea, rheumatoid arthritis, chronic back pain, chronic shortness of breath, hypertension, hyperlipidemia, Ehlers-Danlos syndrome, who is here today to follow-up on her coronary artery disease.   She did several evaluations in the emergency department for chest pain.  Cardiac enzymes were negative.  EKGs were unchanged.  She underwent CT scan in 06/2022 and was transferred to [facility] given her complicated anatomy and images were evaluated by vascular surgeon it was determined there was no significant progression of her disease.  While she was at [facility] had cardiac workup with echocardiogram with PET stress was completed.  CT of the chest showed stable descending aorta repair with a type B aortic dissection with opacification of the false lumen at the level of the renal arteries via lumbar artery.  She was seen in clinic 07/08/2022 had complaints of chest discomfort and pressure that started midsternal radiating to her left jaw and up with rest and exertion without aggravating or alleviating factors.  She was scheduled for Lexiscan  stress testing and repeat echocardiogram at that time.  Echocardiogram revealed an LVEF of 45-50%, global hypokinesis, G2 DD, mild to moderate mitral regurgitation, borderline dilatation of the aortic root measuring 36 mm.  Lexiscan  MPI showed no significant ischemia, normal wall motion, EF estimated 45%, was considered a  low risk scan.  Patient was seen in the ER at [facility] on 03/09/2023 for palpitations and was found to be in atrial fibrillation.  She was started on apixaban .  She had a TEE guided cardioversion on March 03/17/23 and was symptomatic while in atrial fibrillation with fatigue.  They thought the atrial fibrillation was in the setting of an abscess.  She underwent cardioversion and maintained on apixaban .  She was evaluated in the emergency emergency department in [facility] on April 9 with palpitations and tachycardia.  She was medicated for rate control and discharged.  Carvedilol  dose was increased.  Was told [facility] will call and set up a cardioversion.  Patient states nobody called her back to arrange this.  She was interested in getting back to sinus rhythm at that time.  She was set up for cardioversion procedure that was performed on 04/04/2024.  She was shocked 1 time 150 J and converted to sinus rhythm with no immediate postoperative complications.   She was last seen in clinic 04/17/2024 but S.Riddle, NP from EP.  At that time she was doing okay she feels significantly better since being in sinus rhythm.  She continued to use Apple Watch to monitor her heart rhythm.  Continue to have abdominal fullness but weight was decreasing by home scale slowly.  Also had some DOE with walking from the parking spot into clinic.  Continued on apixaban  twice daily with no concerns for bleeding.  They discussed potential A-fib ablation procedure.  She was to have an updated echocardiogram and continued on carvedilol  18.75 mg twice daily.  She was started on spironolactone  25 mg daily and was ordered for updated lab  work in 1 to 2 weeks she was also recommended to stop taking her potassium supplement.  She returns to clinic today stating that overall she has been doing well.  She continues to complain of abdominal bloating and fullness.  Recently was started on spironolactone  with the last visit with the EP.  Has tolerated the  medication well but has not noticed any decrease in fluid.  She states that she does have an upcoming ablation procedure scheduled which she hopes will help with some of the fatigue and shortness of breath fluid retention.  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  She states she has been compliant with her current medication regimen without any adverse side effects.  Denies any hospitalizations or visits to the emergency department.  ROS: 10 point review of system has been reviewed and considered negative except ones been listed in the HPI  Studies Reviewed: Aaron Aas   EKG Interpretation Date/Time:  Wednesday May 09 2024 10:21:44 EDT Ventricular Rate:  74 PR Interval:  212 QRS Duration:  108 QT Interval:  416 QTC Calculation: 461 R Axis:   -17  Text Interpretation: Sinus rhythm with 1st degree A-V block Incomplete left bundle branch block Moderate voltage criteria for LVH, may be normal variant ( R in aVL , Cornell product ) When compared with ECG of 17-Apr-2024 10:30, Confirmed by Ronald Cockayne ([id number]) on 05/09/2024 10:29:54 AM    2D echo 05/04/2024 1. Left ventricular ejection fraction, by estimation, is 50 to 55%. The  left ventricle has low normal function. The left ventricle has no regional  wall motion abnormalities. Left ventricular diastolic parameters were  normal.   2. Right ventricular systolic function is low normal. The right  ventricular size is normal.   3. Right atrial size was mildly dilated.   4. The mitral valve is normal in structure. No evidence of mitral valve  regurgitation.   5. The aortic valve was not well visualized. Aortic valve regurgitation  is not visualized.   Long term monitor, 12/24/2022   The patient was monitored for 5 days, 3 hours.   The predominant rhythm was sinus with an average rate of 90 bpm (range 62-126 bpm in sinus).   There were rare PACs and occasional PVCs (PVC  burden 1-2%).   There were at least 10 supraventricular runs, lasting up to 15 seconds with a maximum rate of 188 bpm.   There were also 19 episodes of wide-complex tachycardia labeled as nonsustained ventricular tachycardia lasting up to 15 beats with a maximum rate of 188 bpm.  Review of morphologies just that some/all of these episodes may actually represent SVT with aberrancy.   No sustained arrhythmia or prolonged pause was observed.   Patient triggered events correspond to sinus rhythm, PACs, and PVCs.   Predominantly sinus rhythm with rare PACs and occasional PVCs.  Several episodes of brief supraventricular tachycardia were noted.  There were also several episodes of brief wide-complex tachycardia, favored to represent SVT with aberrancy over NSVT.  No evidence of atrial fibrillation.   NM myocardial w spect, 09/30/2022 Pharmacological myocardial perfusion imaging study with no significant  ischemia Normal wall motion, EF estimated at 45% (significant GI uptake artifact noted) No EKG changes concerning for ischemia at peak stress or in recovery. Low risk scan   TTE, 09/23/2022 1. Left ventricular ejection fraction, by estimation, is 45 to 50%. The left ventricle has mildly decreased function. The  left ventricle demonstrates global hypokinesis. Left ventricular diastolic parameters are consistent with Grade II diastolic dysfunction (pseudonormalization).   2. Right ventricular systolic function is normal. The right ventricular size is normal.   3. The mitral valve is normal in structure. Mild to moderate mitral valve regurgitation. No evidence of mitral stenosis.   4. The aortic valve has been repaired/replaced, bioprosthetic valve. Aortic valve regurgitation is not visualized. No aortic stenosis is present. Aortic valve area, by VTI measures 2.28 cm. Aortic valve mean gradient measures 5.0 mmHg.   5. The inferior vena cava is normal in size with greater than 50% respiratory variability,  suggesting right atrial pressure of 3 mmHg.   6. There is borderline dilatation of the aortic root, measuring 36 mm.   Comparison(s): 08/02/2019-EF 40-50%.      Risk Assessment/Calculations:    CHA2DS2-VASc Score = 5   This indicates a 7.2% annual risk of stroke. The patient's score is based upon: CHF History: 1 HTN History: 1 Diabetes History: 0 Stroke History: 0 Vascular Disease History: 1 Age Score: 1 Gender Score: 1            Physical Exam:   VS:  BP 118/70   Pulse 74   Ht 5\' 5"  (1.651 m)   Wt 219 lb 6.4 oz (99.5 kg)   SpO2 93%   BMI 36.51 kg/m    Wt Readings from Last 3 Encounters:  05/09/24 219 lb 6.4 oz (99.5 kg)  04/17/24 219 lb (99.3 kg)  04/04/24 220 lb (99.8 kg)    GEN: Well nourished, well developed in no acute distress NECK: No JVD; No carotid bruits CARDIAC: RRR, I/VI systolic murmur RUSB without rubs or gallops RESPIRATORY:  Clear to auscultation without rales, wheezing or rhonchi  ABDOMEN: Soft, obesity, non-tender, non-distended EXTREMITIES:  No edema; No deformity   ASSESSMENT AND PLAN: .   Paroxysmal atrial fibrillation where EKG today reveals sinus rhythm with first-degree AV block with a rate of 74 with an incomplete left bundle branch block and LVH with no acute change from prior studies.  She states that she has upcoming ablation procedure scheduled because she is significantly symptomatic when she has an A-fib.  She has continued on apixaban  5 mg twice daily for CHA2DS2-VASc score of at least 5 for stroke prophylaxis and carvedilol  18.75 mg twice daily.  Coronary artery disease status post CABG x 1 vessel and aortic valve replacement due to bicuspid aortic valve (2011).  She denies any anginal or anginal equivalents today.  EKG reveals no ischemic changes.  She is continued on aspirin  81 mg daily, Imdur  30 mg daily, ezetimibe  10 mg daily, previously was on atorvastatin  patient is unsure as to why medication is no longer on her list.  Will continue  with surveillance echoes for AVR.  Dissection of thoracic abdominal aorta followed by vascular surgery in [facility] Dr. Shirley Douglas.  Stable on CT in July 2024.  History of dissecting abdominal aortic aneurysm repair.  Last CT angio chest abdomen pelvis was completed 06/2023 which showed postsurgical changes of the ascending thoracic aorta repair without complication.  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  Recommending yearly follow-up.  Patient will need to be scheduled for an updated scan on return.  Chronic HFimpEF with last echocardiogram obtained 04/2024 revealed an LVEF of 50-55%, low normal function, no RWMA, right ventricular systolic function low normal and normal in size, no valvular abnormalities  were noted.  Continued on carvedilol , furosemide , lisinopril , and spironolactone .  She is being started on Farxiga 10 mg daily today.  She is also being sent for updated blood work after started on spironolactone  and potassium supplementation's been discontinued.  She has been encouraged to continue to monitor her weight and fluid intake.  She continues to complain of abdominal bloating but there is no swelling to her lower extremities or complaints of shortness of breath, PND and orthopnea.  Other than abdominal swelling she appears to be euvolemic.  Suffers from NYHA class I-II symptoms.  Primary hypertension with a blood pressure today 118/70.  Blood pressures remain stable.  She has been continued on carvedilol , Lasix , Imdur , lisinopril , and spironolactone .  She has been encouraged to continue to monitor pressures 1 to 2 hours postmedication administration as well.  Pure hypercholesterolemia with an LDL of 112.  She is continued on ezetimibe  10 mg daily.  Will likely have to restart on additional statin medication on return.  As goal is 70 or less ideally 55.  Obesity with a BMI of 36.51.  Weight loss  would be beneficial.  Feels as though it is primarily fluid.       Dispo: Patient return to clinic as MD/APP in 6 weeks or sooner if needed for further evaluation of symptoms.  Signed, Berta Denson, NP

## 2024-05-09 NOTE — Therapy (Signed)
 OUTPATIENT PHYSICAL THERAPY TREATMENT      Patient Name: Rachel Torres MRN: [id number] DOB:[birth year], 68 y.o., female Today's Date: 05/10/2024  END OF SESSION:  PT End of Session - 05/10/24 1143     Visit Number 35    Number of Visits 44    Date for PT Re-Evaluation 05/17/24    Authorization Type [employer]  Medicare HMO    Authorization Time Period 01/26/24-03/22/24    Progress Note Due on Visit 10    PT Start Time 1145    PT Stop Time 1229    PT Time Calculation (min) 44 min    Activity Tolerance Patient tolerated treatment well;No increased pain    Behavior During Therapy WFL for tasks assessed/performed                       Past Medical History:  Diagnosis Date   AAA (abdominal aortic aneurysm) (HCC)    a. Chronic w/o evidence of aneurysmal dil on CTA 01/2016.   Alcohol abuse    Allergy    Anemia    Anxiety    Asthma    CAD (coronary artery disease)    a. 08/2010 s/p CABG x 1 (VG->RCA) @ time of Ao dissection repair; b. 01/2016 Lexiscan  MV: mid antsept/apical defect w/ ? peri-infarct ischemia-->likely attenuation-->Med Rx.   Chewing difficulty    Chronic combined systolic (congestive) and diastolic (congestive) heart failure (HCC)    a. 2012 EF 30-35%; b. 04/2015 EF 40-45%; c. 01/2016 Echo: Ef 55-65%, nl AoV bioprosthesis, nl RV, nl PASP.   Constipation    Depression    Edema of both lower extremities    Gallbladder sludge    GERD (gastroesophageal reflux disease)    Heart failure (HCC)    Heart valve problem    History of Bicuspid Aortic Valve    a. 08/2010 s/p AVR @ time of Ao dissection repair; b. 01/2016 Echo: Ef 55-65%, nl AoV bioprosthesis, nl RV, nl PASP.   Hx of repair of dissecting thoracic aortic aneurysm, Stanford type A    a. 08/2010 s/p repair with AVR and VG->RCA; b. 01/2016 CTA: stable appearance of Asc Thoracic Aortic graft. Opacification of flase lumen of chronic abd Ao dissection w/ retrograde flow through lumbar arteries. No aneurysmal dil  of flase lumen.   Hyperlipidemia    Hypertension    Hypertensive heart disease    IBS (irritable bowel syndrome)    Internal hemorrhoids    Kidney problem    Marfan syndrome    pt denies   Migraine    Obstructive sleep apnea    Osteoarthritis    Osteoporosis    Palpitations    Pneumonia    RA (rheumatoid arthritis) (HCC)    a. Followed by Dr. Chaim Colony   Past Surgical History:  Procedure Laterality Date   ABDOMINAL AORTIC ANEURYSM REPAIR     CARDIAC CATHETERIZATION  2001   CARDIOVERSION N/A 04/04/2024   Procedure: CARDIOVERSION;  Surgeon: Devorah Fonder, MD;  Location: [facility] ORS;  Service: Cardiovascular;  Laterality: N/A;   CESAREAN SECTION  1986 & 1991   CHOLECYSTECTOMY     COLONOSCOPY     COLONOSCOPY WITH PROPOFOL  N/A 01/19/2024   Procedure: COLONOSCOPY WITH PROPOFOL ;  Surgeon: Luke Salaam, MD;  Location: [facility] ENDOSCOPY;  Service: Gastroenterology;  Laterality: N/A;   ESOPHAGOGASTRODUODENOSCOPY (EGD) WITH PROPOFOL  N/A 05/08/2020   Procedure: ESOPHAGOGASTRODUODENOSCOPY (EGD) WITH PROPOFOL ;  Surgeon: Irby Mannan, MD;  Location: [facility] ENDOSCOPY;  Service: Endoscopy;  Laterality: N/A;   FRACTURE SURGERY Right    shoulder replacement   HEMORRHOID BANDING     ORIF DISTAL RADIUS FRACTURE Left    POLYPECTOMY  01/19/2024   Procedure: POLYPECTOMY;  Surgeon: Luke Salaam, MD;  Location: [facility] ENDOSCOPY;  Service: Gastroenterology;;   UPPER GASTROINTESTINAL ENDOSCOPY     Patient Active Problem List   Diagnosis Date Noted   Typical atrial flutter (HCC) 04/04/2024   Adenomatous polyp of colon 01/19/2024   Heart failure with improved ejection fraction (HFimpEF) (HCC) 01/18/2024   Abnormal metabolism 11/14/2023   Weight regain after inital weight loss 11/14/2023   Multiple closed tarsal fractures of left foot, sequela 07/12/2023   Arm contusion, right, sequela 07/12/2023   Lumbar disc disease 07/12/2023   PAF (paroxysmal atrial fibrillation) (HCC) 06/09/2023   Paresthesia  06/03/2023   Obesity, Beginning BMI 35.45 02/17/2023   Tinnitus 01/14/2023   Change in vision 01/14/2023   Encounter for screening colonoscopy 01/14/2023   Personal history of fall 01/14/2023   Problem related to unspecified psychosocial circumstances 12/28/2022   Person encountering health services to consult on behalf of another person 12/28/2022   Palpitation 12/10/2022   Fatigue 10/26/2022   B12 deficiency 10/26/2022   Right shoulder pain 09/15/2022   Depression 09/14/2022   Other constipation 05/12/2022   Medicare annual wellness visit, initial 07/17/2021   TMJ (dislocation of temporomandibular joint) 07/17/2021   Encounter for screening mammogram for breast cancer 06/05/2021   Diarrhea 04/11/2020   Epigastric pain 02/19/2020   Dark stools 02/19/2020   History of atrial fibrillation 02/05/2019   SOB (shortness of breath) 01/27/2019   Irregular surface of cornea 12/25/2018   Acute cough 09/08/2017   History of HPV infection 12/01/2016   Screening mammogram, encounter for 11/29/2016   Estrogen deficiency 11/29/2016   Encounter for routine gynecological examination 11/29/2016   Grade III hemorrhoids 10/06/2016   Tachycardia 08/14/2016   Hemorrhoids 08/13/2016   Atypical migraine 08/03/2016   CAD (coronary artery disease)    AAA (abdominal aortic aneurysm) (HCC)    Chronic combined systolic (congestive) and diastolic (congestive) heart failure (HCC)    Prediabetes 02/11/2016   Generalized anxiety disorder 12/08/2015   Panic attacks 07/28/2015   Chest pain 07/09/2013   Sensorineural hearing loss 03/27/2013   Vitamin D  deficiency 07/03/2012   H/O aortic dissection 05/12/2012   Routine general medical examination at a health care facility 09/20/2011   Atypical chest pain 09/15/2011   S/P AVR (aortic valve replacement) 01/07/2011   CORONARY ARTERY BYPASS GRAFT, HX OF 01/07/2011   Arthritis 12/20/2010   Back pain 12/15/2010   H/O dissecting abdominal aortic aneurysm repair  08/24/2010   IRRITABLE BOWEL SYNDROME 09/09/2009   Obstructive sleep apnea 08/04/2009   External hemorrhoid 06/26/2009   Osteoporosis 01/09/2009   Hyperlipidemia 07/26/2007   Depression with anxiety 07/26/2007   Essential hypertension 07/26/2007   Allergic rhinitis 07/26/2007   Asthma 07/26/2007   GERD 07/26/2007   Osteoarthritis 07/26/2007   Ocular migraine 07/26/2007   Mild intermittent asthma without complication 07/26/2007    PCP: Clemens Curt MD   REFERRING PROVIDER: Bert Britain  REFERRING DIAG: L rotator cuff   THERAPY DIAG:  Abnormal posture  Other abnormalities of gait and mobility  Other low back pain  Stiffness of left shoulder, not elsewhere classified  Rationale for Evaluation and Treatment: Rehabilitation  ONSET DATE: Feb 2024  SUBJECTIVE:  SUBJECTIVE STATEMENT:   Patient reports her blood pressure is good but her HR is staying in the 90s, is very short of breath.   Hand dominance: Left  PERTINENT HISTORY: Myasia Sinatra is a 66yoF familiar to our services. Pt referred to PT for strengthening of chronic left shoulder DJD, known remote RC injury. Recently fell and broke four metatarsals, since cleared from use of CAM rocker. She was treated successfully in past for right shoulder pain and has past medical history of Right shoulder surgery replacement. 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 PMH aorta type A dissection in sept 2011 s/p aortic valve replacement, bypass of R coronary artery, anemia, anxiety, CAD, CHF, depression, GERD, Heart failure, HLD, HTN, IBS, migraine, osteoarthritis, osteoporosis, RA, back pain.    Back pain:  Patient has new referral for radiculopathy, lumbar region. MRI showed degeneration of L1-2 and L2-3 with some disc space collapse and minor  retrolisthesis of L2-3. Issue began in February, felt numbness in L thigh. No cause known, but she was at a museum and noticed it was numb. Sometimes L foot numbness and toe numbness of 2nd and 3rd toe causing tripping. This caused her to trip July 4th and break five metatarsals. Some minor pain in the low pain noted. Pain noted to be 2-3/10 Pain in hip 3-4/10 of L hip.    PAIN:  Are you having pain? Yes, 3/10 pain in lateral deltoids in the shoulder.  PRECAUTIONS: None  FALLS:  Has patient fallen in last 6 months? Yes. Number of falls 2  PATIENT GOALS:to lift more than 3lb with LUE, be able to eat a meal without arm being tired.    OBJECTIVE:  DIAGNOSTIC FINDINGS:  IMPRESSION: Mild degenerative change in the cervical spine without acute fracture or traumatic subluxation.   IMPRESSION: Full width, full width tears of the supraspinatus and infraspinatus tendons with retraction to the glenoid. Grade 2/3 infraspinatus atrophy.   Moderate tendinosis of the distal subscapularis tendon with articular sided fraying of the cephalad fibers.   Moderate intra-articular long head biceps tendinosis with fraying and possible partial tearing.   Mild glenohumeral and moderate AC joint osteoarthritis.   Degenerative superior labral tearing anteriorly and posteriorly.   PATIENT SURVEYS:  Quick Dash 50%  and FOTO 46%   COGNITION: Overall cognitive status: Within functional limits for tasks assessed                                  SENSATION: WFL   POSTURE: Slight rounded shoulders and shoulder elevation    UPPER EXTREMITY ROM:    Active ROM Right eval 9/16: recert RUE Left eval 9/16 RECERT LUE  Shoulder flexion 64 122 91 92  Shoulder extension        Shoulder abduction 95 88 52 82  Shoulder adduction        Shoulder internal rotation Unable to test at 90 61 degrees 5 40  Shoulder external rotation 18at side of body -5 degrees at 90 degrees 34 35  Elbow flexion [facility]  WFL    Elbow extension [facility]  WFL   Wrist flexion        Wrist extension        Wrist ulnar deviation        Wrist radial deviation        Wrist pronation        Wrist supination        (  Blank rows = not tested)   UPPER EXTREMITY MMT:   MMT Right eval Left eval  Shoulder flexion      Shoulder extension      Shoulder abduction      Shoulder adduction      Shoulder internal rotation      Shoulder external rotation      Middle trapezius      Lower trapezius      Elbow flexion      Elbow extension      Wrist flexion      Wrist extension      Wrist ulnar deviation      Wrist radial deviation      Wrist pronation      Wrist supination      Grip strength (lbs) 30 35  (Blank rows = not tested) Unable to test strength due to pain with motion   TRUNK ROM: Trunk Flexion 65  Trunk Extension 7  Trunk R SB Limited 25%  Trunk L SB WFL  Trunk R rotation WFL  Trunk L rotation Limited 25%    Ankle ROM Seated: Motion: AROM seated Right Left  Plantarflexion 4+ 3  Dorsiflexion 4+ 4  Inversion 4 3+  Eversion 4+ 3+    SHOULDER SPECIAL TESTS: Impingement tests:  unable to test due to pain in starting position SLAP lesions:  unable to test due to pain in starting position Instability tests:  unable to test due to pain in starting position Rotator cuff assessment:  unable to test due to pain in starting position Biceps assessment:  unable to test due to pain in starting position   LOW BACK SPECIAL TEST: SLR: negative  Scour test: negative FABER: negative Pelvic compression/distraction: negative  Slump test positive LLE  6 min walk test: 1160 ft increased left foot drag and antalgic gait pattern with foot eversion    JOINT MOBILITY TESTING:  AP: painful PA: painful Inferior: painful Distraction: reduces pain   Back mobilization hypomobile and painful to grade II mobilizations    PALPATION:  Tenderness to all joint regions of GH     TODAY'S TREATMENT: DATE: 05/10/24     HR: 83  TherEx Supine: -bring leg on top of other leg into figure four position x 30 sec ea   -figure four stretch 60 seconds each LE  -posterior pelvic tilt 10x  -trA contraction 10 x 5 sec  -Childs pose x 30 sec  -BTB abduction 15x -BTB march 15x   Sidelying: -thoracic rotation 10x10 second holds  Seated: -large swiss ball forward trunk rollout 10x  NMR: BAPs board: -df/pf 15x -inversion/eversion 15x -clockwise 15x; counterclockwise 15x  -static hold 30 seconds x  2 trials       PATIENT EDUCATION: Education details: goals, POC  Person educated: Patient Education method: Explanation, Demonstration, Tactile cues, Verbal cues, and Handouts Education comprehension: verbalized understanding, returned demonstration, verbal cues required, and tactile cues required  HOME EXERCISE PROGRAM:  Access Code: [email or web address] URL: [email or web address] Date: 10/03/2023 Prepared by: Avah Bashor  Exercises - Seated Scapular Retraction  - 1 x daily - 7 x weekly - 2 sets - 10 reps - 5 hold - Neck Sidebending  - 1 x daily - 7 x weekly - 2 sets - 2 reps - 30 hold - Supine Isometric Shoulder Extension with Towel  - 1 x daily - 7 x weekly - 2 sets - 10 reps - 5 hold - Supine Isometric Shoulder Adduction with Towel Roll  - 1  x daily - 7 x weekly - 2 sets - 10 reps - 5 hold - Seated Bilateral Shoulder Flexion Towel Slide at Table Top  - 1 x daily - 7 x weekly - 1 sets - 3 reps - 30 hold - Supine Posterior Pelvic Tilt  - 1 x daily - 7 x weekly - 2 sets - 5 reps - 30 hold - Supine LE Neural Mobilization  - 1 x daily - 7 x weekly - 2 sets - 10 reps - 5 hold - Seated Ankle Alphabet  - 1 x daily - 7 x weekly - 2 sets - 1 reps - 5 hold  ASSESSMENT:  CLINICAL IMPRESSION:   Patient presents with excellent motivation. She reports significant back pain reduction. Strengthening tolerated well. L foot weakness continues to be an area of progress. Pt will continue to benefit from  skilled therapy to address remaining deficits in order to improve overall QoL and return to PLOF.     OBJECTIVE IMPAIRMENTS: cardiopulmonary status limiting activity, decreased activity tolerance, decreased mobility, increased fascial restrictions, impaired perceived functional ability, increased muscle spasms, impaired flexibility, impaired UE functional use, improper body mechanics, postural dysfunction, and pain.   ACTIVITY LIMITATIONS: carrying, lifting, bed mobility, bathing, dressing, self feeding, reach over head, and caring for others  PARTICIPATION LIMITATIONS: meal prep, cleaning, laundry, personal finances, interpersonal relationship, driving, shopping, community activity, yard work, church, and caregiving for husband  PERSONAL FACTORS: Age, Fitness, Past/current experiences, Time since onset of injury/illness/exacerbation, and 3+ comorbidities: aorta type A dissection in sept 2011 with aortic valve replacement bypass of R coronary artery, AAA, anemia, anxiety, CAD, CHF, depression, GERD, Heart failure, HLD, HTN, IBS, Kidney problems, migraine, osteoarthritis, osteoporosis, RA, ocular migraines, back pain are also affecting patient's functional outcome.   REHAB POTENTIAL: Fair    CLINICAL DECISION MAKING: Evolving/moderate complexity  EVALUATION COMPLEXITY: Moderate   GOALS: Goals reviewed with patient? Yes  SHORT TERM GOALS: Target date: 04/19/2024    Patient will be independent in home exercise program to improve strength/mobility for better functional independence with ADLs. Baseline:7/23: HEP given 9/16: compliant 1x/week 4/3: improving compliance Goal status: On going     LONG TERM GOALS: Target date: 05/17/2024    Patient will increase FOTO score to equal to or greater than  57%   to demonstrate statistically significant improvement in mobility and quality of life.  Baseline: 7/22: 46 9/16: 50% 10/7: 40% 12/10: 57% Goal status: MET  2.  Patient will improve  shoulder AROM to > 140 degrees of flexion, scaption, and abduction for improved ability to perform overhead activities. Baseline: 7/22: see above 9/16: see above 10/7 : defer to exacerbation 12/10: shoulder abduction >140, flexion < 140 , assess scaption next visit 2/6: flexion >140, abduction >140 scaption >140 Goal status: MET  3.  Patient will demonstrate adequate shoulder ROM and strength to be able to shave and dress independently with pain less than 3/10. Baseline: 7/23: 6/10 pain with dressing, limited ROM, unable to test strength 9/16: 3/10 pain with dressing  Goal status: MET  4.  Patient will decrease Quick DASH score by > 8 points demonstrating reduced self-reported upper extremity disability for L and R shoulder  Baseline: 7/23: L shoulder 50% 9/16: R=54.54 % L 66% L 81% R 24% 12/10: R: 43.2% L: 56.8% 2/6: L 22%  R 34%  Goal status: MET  5.  Patient will be able to eat an entire meal without resting arm demonstrating improved functional strength Baseline: 7/23: has  to rest while eating due to pain. 9/16: able to make halfway through meal 10/7: can make it about halfway through meal, some meals can get 3/4s through meal 12/10: improved, still has to rest every once in a while 2/6: can get halfway to 2/3rds of the meal.  Goal status: deferred at this time to focus on back/leg  6.  Patient will be able to stand 15 minutes without her LLE going numb allowing for ADLS such as showering and iADLs such as cooking Baseline: 5 minutes 12/10: will assess next visit 2/6: 10 minutes 4/3: 10 minutes Goal status: IN PROGRESS  7. Patient will increase six minute walk test distance to >1500 for progression to community ambulator and improve gait ability Baseline: 1160 ft  12/10: 1010 ft 2/6: 1140 ft  4/3: 1201 ft  Goal status: IN PROGRESS  8  Patient will reduce modified Oswestry score to <20 as to demonstrate minimal disability with ADLs including improved sleeping tolerance, walking/sitting  tolerance etc for better mobility with ADLs.  Baseline: 2/6: 60% 4/3: 50%  Goal status: In progress  PLAN:  PT FREQUENCY: 2x/week  PT DURATION: 8 weeks  PLANNED INTERVENTIONS: Therapeutic exercises, Therapeutic activity, Neuromuscular re-education, Balance training, Gait training, Patient/Family education, Self Care, Joint mobilization, Vestibular training, Canalith repositioning, Visual/preceptual remediation/compensation, Dry Needling, Electrical stimulation, Spinal mobilization, Cryotherapy, Moist heat, Compression bandaging, scar mobilization, Splintting, Taping, Traction, Ultrasound, Biofeedback, Ionotophoresis 4mg /ml Dexamethasone , Manual therapy, and Re-evaluation  PLAN FOR NEXT SESSION:  Continue with LUE strengthening  L ankle strengthening, distraction, core activation, reassess scaption and flexion ROM, make HEP    Halah Whiteside  Sadie Crank ,DPT Physical Therapist- [facility]  [facility]    05/10/24, 12:47 PM

## 2024-05-09 NOTE — Patient Instructions (Signed)
 Medication Instructions:  Your physician recommends the following medication changes.  START TAKING: Farxiga 10 mg daily  *If you need a refill on your cardiac medications before your next appointment, please call your pharmacy*  Lab Work: Your provider would like for you to have following labs drawn today Bmet, Mag.   If you have labs (blood work) drawn today and your tests are completely normal, you will receive your results only by: MyChart Message (if you have MyChart) OR A paper copy in the mail If you have any lab test that is abnormal or we need to change your treatment, we will call you to review the results.  Testing/Procedures: No test ordered today   Follow-Up: At [facility], you and your health needs are our priority.  As part of our continuing mission to provide you with exceptional heart care, our providers are all part of one team.  This team includes your primary Cardiologist (physician) and Advanced Practice Providers or APPs (Physician Assistants and Nurse Practitioners) who all work together to provide you with the care you need, when you need it.  Your next appointment:   6 week(s)  Provider:   Belva Boyden, MD or Ronald Cockayne, NP

## 2024-05-10 ENCOUNTER — Ambulatory Visit: Payer: Medicare HMO

## 2024-05-10 ENCOUNTER — Ambulatory Visit: Payer: Self-pay | Admitting: Cardiology

## 2024-05-10 DIAGNOSIS — M5459 Other low back pain: Secondary | ICD-10-CM | POA: Diagnosis not present

## 2024-05-10 DIAGNOSIS — R293 Abnormal posture: Secondary | ICD-10-CM

## 2024-05-10 DIAGNOSIS — M25612 Stiffness of left shoulder, not elsewhere classified: Secondary | ICD-10-CM | POA: Diagnosis not present

## 2024-05-10 DIAGNOSIS — R2689 Other abnormalities of gait and mobility: Secondary | ICD-10-CM | POA: Diagnosis not present

## 2024-05-10 LAB — BASIC METABOLIC PANEL WITH GFR
BUN/Creatinine Ratio: 19 (ref 12–28)
BUN: 19 mg/dL (ref 8–27)
CO2: 23 mmol/L (ref 20–29)
Calcium: 10.1 mg/dL (ref 8.7–10.3)
Chloride: 99 mmol/L (ref 96–106)
Creatinine, Ser: 1.01 mg/dL — ABNORMAL HIGH (ref 0.57–1.00)
Glucose: 107 mg/dL — ABNORMAL HIGH (ref 70–99)
Potassium: 4.4 mmol/L (ref 3.5–5.2)
Sodium: 139 mmol/L (ref 134–144)
eGFR: 61 mL/min/{1.73_m2} (ref >59)

## 2024-05-10 LAB — MAGNESIUM: Magnesium: 2.4 mg/dL — ABNORMAL HIGH (ref 1.6–2.3)

## 2024-05-15 ENCOUNTER — Telehealth: Payer: Self-pay | Admitting: Cardiovascular Disease

## 2024-05-15 ENCOUNTER — Ambulatory Visit (INDEPENDENT_AMBULATORY_CARE_PROVIDER_SITE_OTHER): Payer: Medicare HMO | Admitting: Psychology

## 2024-05-15 DIAGNOSIS — F331 Major depressive disorder, recurrent, moderate: Secondary | ICD-10-CM

## 2024-05-15 NOTE — Telephone Encounter (Signed)
 [facility] calling to get copy of sleep study faxed over to them. Fax# [phone]
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
## 2024-05-16 NOTE — Therapy (Signed)
 OUTPATIENT PHYSICAL THERAPY TREATMENT/ RECERT ***      Patient Name: Rachel Torres MRN: [id number] DOB:05-16-1956, 68 y.o., female Today's Date: 05/16/2024  END OF SESSION:              Past Medical History:  Diagnosis Date   AAA (abdominal aortic aneurysm) (HCC)    a. Chronic w/o evidence of aneurysmal dil on CTA 01/2016.   Alcohol abuse    Allergy    Anemia    Anxiety    Asthma    CAD (coronary artery disease)    a. 08/2010 s/p CABG x 1 (VG->RCA) @ time of Ao dissection repair; b. 01/2016 Lexiscan  MV: mid antsept/apical defect w/ ? peri-infarct ischemia-->likely attenuation-->Med Rx.   Chewing difficulty    Chronic combined systolic (congestive) and diastolic (congestive) heart failure (HCC)    a. 2012 EF 30-35%; b. 04/2015 EF 40-45%; c. 01/2016 Echo: Ef 55-65%, nl AoV bioprosthesis, nl RV, nl PASP.   Constipation    Depression    Edema of both lower extremities    Gallbladder sludge    GERD (gastroesophageal reflux disease)    Heart failure (HCC)    Heart valve problem    History of Bicuspid Aortic Valve    a. 08/2010 s/p AVR @ time of Ao dissection repair; b. 01/2016 Echo: Ef 55-65%, nl AoV bioprosthesis, nl RV, nl PASP.   Hx of repair of dissecting thoracic aortic aneurysm, Stanford type A    a. 08/2010 s/p repair with AVR and VG->RCA; b. 01/2016 CTA: stable appearance of Asc Thoracic Aortic graft. Opacification of flase lumen of chronic abd Ao dissection w/ retrograde flow through lumbar arteries. No aneurysmal dil of flase lumen.   Hyperlipidemia    Hypertension    Hypertensive heart disease    IBS (irritable bowel syndrome)    Internal hemorrhoids    Kidney problem    Marfan syndrome    pt denies   Migraine    Obstructive sleep apnea    Osteoarthritis    Osteoporosis    Palpitations    Pneumonia    RA (rheumatoid arthritis) (HCC)    a. Followed by Dr. Chaim Colony   Past Surgical History:  Procedure Laterality Date   ABDOMINAL AORTIC ANEURYSM REPAIR      CARDIAC CATHETERIZATION  2001   CARDIOVERSION N/A 04/04/2024   Procedure: CARDIOVERSION;  Surgeon: Devorah Fonder, MD;  Location: [facility] ORS;  Service: Cardiovascular;  Laterality: N/A;   CESAREAN SECTION  1986 & 1991   CHOLECYSTECTOMY     COLONOSCOPY     COLONOSCOPY WITH PROPOFOL  N/A 01/19/2024   Procedure: COLONOSCOPY WITH PROPOFOL ;  Surgeon: Luke Salaam, MD;  Location: [facility] ENDOSCOPY;  Service: Gastroenterology;  Laterality: N/A;   ESOPHAGOGASTRODUODENOSCOPY (EGD) WITH PROPOFOL  N/A 05/08/2020   Procedure: ESOPHAGOGASTRODUODENOSCOPY (EGD) WITH PROPOFOL ;  Surgeon: Irby Mannan, MD;  Location: [facility] ENDOSCOPY;  Service: Endoscopy;  Laterality: N/A;   FRACTURE SURGERY Right    shoulder replacement   HEMORRHOID BANDING     ORIF DISTAL RADIUS FRACTURE Left    POLYPECTOMY  01/19/2024   Procedure: POLYPECTOMY;  Surgeon: Luke Salaam, MD;  Location: [facility] ENDOSCOPY;  Service: Gastroenterology;;   UPPER GASTROINTESTINAL ENDOSCOPY     Patient Active Problem List   Diagnosis Date Noted   Typical atrial flutter (HCC) 04/04/2024   Adenomatous polyp of colon 01/19/2024   Heart failure with improved ejection fraction (HFimpEF) (HCC) 01/18/2024   Abnormal metabolism 11/14/2023   Weight regain after inital weight loss 11/14/2023  Multiple closed tarsal fractures of left foot, sequela 07/12/2023   Arm contusion, right, sequela 07/12/2023   Lumbar disc disease 07/12/2023   PAF (paroxysmal atrial fibrillation) (HCC) 06/09/2023   Paresthesia 06/03/2023   Obesity, Beginning BMI 35.45 02/17/2023   Tinnitus 01/14/2023   Change in vision 01/14/2023   Encounter for screening colonoscopy 01/14/2023   Personal history of fall 01/14/2023   Problem related to unspecified psychosocial circumstances 12/28/2022   Person encountering health services to consult on behalf of another person 12/28/2022   Palpitation 12/10/2022   Fatigue 10/26/2022   B12 deficiency 10/26/2022   Right shoulder pain  09/15/2022   Depression 09/14/2022   Other constipation 05/12/2022   Medicare annual wellness visit, initial 07/17/2021   TMJ (dislocation of temporomandibular joint) 07/17/2021   Encounter for screening mammogram for breast cancer 06/05/2021   Diarrhea 04/11/2020   Epigastric pain 02/19/2020   Dark stools 02/19/2020   History of atrial fibrillation 02/05/2019   SOB (shortness of breath) 01/27/2019   Irregular surface of cornea 12/25/2018   Acute cough 09/08/2017   History of HPV infection 12/01/2016   Screening mammogram, encounter for 11/29/2016   Estrogen deficiency 11/29/2016   Encounter for routine gynecological examination 11/29/2016   Grade III hemorrhoids 10/06/2016   Tachycardia 08/14/2016   Hemorrhoids 08/13/2016   Atypical migraine 08/03/2016   CAD (coronary artery disease)    AAA (abdominal aortic aneurysm) (HCC)    Chronic combined systolic (congestive) and diastolic (congestive) heart failure (HCC)    Prediabetes 02/11/2016   Generalized anxiety disorder 12/08/2015   Panic attacks 07/28/2015   Chest pain 07/09/2013   Sensorineural hearing loss 03/27/2013   Vitamin D  deficiency 07/03/2012   H/O aortic dissection 05/12/2012   Routine general medical examination at a health care facility 09/20/2011   Atypical chest pain 09/15/2011   S/P AVR (aortic valve replacement) 01/07/2011   CORONARY ARTERY BYPASS GRAFT, HX OF 01/07/2011   Arthritis 12/20/2010   Back pain 12/15/2010   H/O dissecting abdominal aortic aneurysm repair 08/24/2010   IRRITABLE BOWEL SYNDROME 09/09/2009   Obstructive sleep apnea 08/04/2009   External hemorrhoid 06/26/2009   Osteoporosis 01/09/2009   Hyperlipidemia 07/26/2007   Depression with anxiety 07/26/2007   Essential hypertension 07/26/2007   Allergic rhinitis 07/26/2007   Asthma 07/26/2007   GERD 07/26/2007   Osteoarthritis 07/26/2007   Ocular migraine 07/26/2007   Mild intermittent asthma without complication 07/26/2007    PCP:  Clemens Curt MD   REFERRING PROVIDER: Bert Britain  REFERRING DIAG: L rotator cuff   THERAPY DIAG:  No diagnosis found.  Rationale for Evaluation and Treatment: Rehabilitation  ONSET DATE: Feb 2024  SUBJECTIVE:  SUBJECTIVE STATEMENT:   ***  Hand dominance: Left  PERTINENT HISTORY: Priscilla Kirstein is a 66yoF familiar to our services. Pt referred to PT for strengthening of chronic left shoulder DJD, known remote RC injury. Recently fell and broke four metatarsals, since cleared from use of CAM rocker. She was treated successfully in past for right shoulder pain and has past medical history of Right shoulder surgery replacement. 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 PMH aorta type A dissection in sept 2011 s/p aortic valve replacement, bypass of R coronary artery, anemia, anxiety, CAD, CHF, depression, GERD, Heart failure, HLD, HTN, IBS, migraine, osteoarthritis, osteoporosis, RA, back pain.    Back pain:  Patient has new referral for radiculopathy, lumbar region. MRI showed degeneration of L1-2 and L2-3 with some disc space collapse and minor retrolisthesis of L2-3. Issue began in February, felt numbness in L thigh. No cause known, but she was at a museum and noticed it was numb. Sometimes L foot numbness and toe numbness of 2nd and 3rd toe causing tripping. This caused her to trip July 4th and break five metatarsals. Some minor pain in the low pain noted. Pain noted to be 2-3/10 Pain in hip 3-4/10 of L hip.    PAIN:  Are you having pain? Yes, 3/10 pain in lateral deltoids in the shoulder.  PRECAUTIONS: None  FALLS:  Has patient fallen in last 6 months? Yes. Number of falls 2  PATIENT GOALS:to lift more than 3lb with LUE, be able to eat a meal without arm being tired.    OBJECTIVE:   DIAGNOSTIC FINDINGS:  IMPRESSION: Mild degenerative change in the cervical spine without acute fracture or traumatic subluxation.   IMPRESSION: Full width, full width tears of the supraspinatus and infraspinatus tendons with retraction to the glenoid. Grade 2/3 infraspinatus atrophy.   Moderate tendinosis of the distal subscapularis tendon with articular sided fraying of the cephalad fibers.   Moderate intra-articular long head biceps tendinosis with fraying and possible partial tearing.   Mild glenohumeral and moderate AC joint osteoarthritis.   Degenerative superior labral tearing anteriorly and posteriorly.   PATIENT SURVEYS:  Quick Dash 50%  and FOTO 46%   COGNITION: Overall cognitive status: Within functional limits for tasks assessed                                  SENSATION: WFL   POSTURE: Slight rounded shoulders and shoulder elevation    UPPER EXTREMITY ROM:    Active ROM Right eval 9/16: recert RUE Left eval 9/16 RECERT LUE  Shoulder flexion 64 122 91 92  Shoulder extension        Shoulder abduction 95 88 52 82  Shoulder adduction        Shoulder internal rotation Unable to test at 90 61 degrees 5 40  Shoulder external rotation 18at side of body -5 degrees at 90 degrees 34 35  Elbow flexion [facility]  WFL   Elbow extension [facility]  WFL   Wrist flexion        Wrist extension        Wrist ulnar deviation        Wrist radial deviation        Wrist pronation        Wrist supination        (Blank rows = not tested)   UPPER EXTREMITY MMT:   MMT Right eval Left eval  Shoulder flexion  Shoulder extension      Shoulder abduction      Shoulder adduction      Shoulder internal rotation      Shoulder external rotation      Middle trapezius      Lower trapezius      Elbow flexion      Elbow extension      Wrist flexion      Wrist extension      Wrist ulnar deviation      Wrist radial deviation      Wrist pronation      Wrist supination      Grip  strength (lbs) 30 35  (Blank rows = not tested) Unable to test strength due to pain with motion   TRUNK ROM: Trunk Flexion 65  Trunk Extension 7  Trunk R SB Limited 25%  Trunk L SB WFL  Trunk R rotation WFL  Trunk L rotation Limited 25%    Ankle ROM Seated: Motion: AROM seated Right Left  Plantarflexion 4+ 3  Dorsiflexion 4+ 4  Inversion 4 3+  Eversion 4+ 3+    SHOULDER SPECIAL TESTS: Impingement tests:  unable to test due to pain in starting position SLAP lesions:  unable to test due to pain in starting position Instability tests:  unable to test due to pain in starting position Rotator cuff assessment:  unable to test due to pain in starting position Biceps assessment:  unable to test due to pain in starting position   LOW BACK SPECIAL TEST: SLR: negative  Scour test: negative FABER: negative Pelvic compression/distraction: negative  Slump test positive LLE  6 min walk test: 1160 ft increased left foot drag and antalgic gait pattern with foot eversion    JOINT MOBILITY TESTING:  AP: painful PA: painful Inferior: painful Distraction: reduces pain   Back mobilization hypomobile and painful to grade II mobilizations    PALPATION:  Tenderness to all joint regions of GH     TODAY'S TREATMENT: DATE: 05/16/24   Physical therapy treatment session today consisted of completing assessment of goals and administration of testing as demonstrated and documented in flow sheet, treatment, and goals section of this note. Addition treatments may be found below.   HR: 83  TherEx Supine: -bring leg on top of other leg into figure four position x 30 sec ea   -figure four stretch 60 seconds each LE  -posterior pelvic tilt 10x  -trA contraction 10 x 5 sec  -Childs pose x 30 sec  -BTB abduction 15x -BTB march 15x   Sidelying: -thoracic rotation 10x10 second holds  Seated: -large swiss ball forward trunk rollout 10x  NMR: BAPs board: -df/pf 15x -inversion/eversion  15x -clockwise 15x; counterclockwise 15x  -static hold 30 seconds x  2 trials       PATIENT EDUCATION: Education details: goals, POC  Person educated: Patient Education method: Explanation, Demonstration, Tactile cues, Verbal cues, and Handouts Education comprehension: verbalized understanding, returned demonstration, verbal cues required, and tactile cues required  HOME EXERCISE PROGRAM:  Access Code: [email or web address] URL: [email or web address] Date: 10/03/2023 Prepared by: Annica Marinello  Exercises - Seated Scapular Retraction  - 1 x daily - 7 x weekly - 2 sets - 10 reps - 5 hold - Neck Sidebending  - 1 x daily - 7 x weekly - 2 sets - 2 reps - 30 hold - Supine Isometric Shoulder Extension with Towel  - 1 x daily - 7 x weekly - 2 sets - 10 reps - 5  hold - Supine Isometric Shoulder Adduction with Towel Roll  - 1 x daily - 7 x weekly - 2 sets - 10 reps - 5 hold - Seated Bilateral Shoulder Flexion Towel Slide at Table Top  - 1 x daily - 7 x weekly - 1 sets - 3 reps - 30 hold - Supine Posterior Pelvic Tilt  - 1 x daily - 7 x weekly - 2 sets - 5 reps - 30 hold - Supine LE Neural Mobilization  - 1 x daily - 7 x weekly - 2 sets - 10 reps - 5 hold - Seated Ankle Alphabet  - 1 x daily - 7 x weekly - 2 sets - 1 reps - 5 hold  ASSESSMENT:  CLINICAL IMPRESSION:   *** Pt will continue to benefit from skilled therapy to address remaining deficits in order to improve overall QoL and return to PLOF.     OBJECTIVE IMPAIRMENTS: cardiopulmonary status limiting activity, decreased activity tolerance, decreased mobility, increased fascial restrictions, impaired perceived functional ability, increased muscle spasms, impaired flexibility, impaired UE functional use, improper body mechanics, postural dysfunction, and pain.   ACTIVITY LIMITATIONS: carrying, lifting, bed mobility, bathing, dressing, self feeding, reach over head, and caring for others  PARTICIPATION LIMITATIONS: meal prep,  cleaning, laundry, personal finances, interpersonal relationship, driving, shopping, community activity, yard work, church, and caregiving for husband  PERSONAL FACTORS: Age, Fitness, Past/current experiences, Time since onset of injury/illness/exacerbation, and 3+ comorbidities: aorta type A dissection in sept 2011 with aortic valve replacement bypass of R coronary artery, AAA, anemia, anxiety, CAD, CHF, depression, GERD, Heart failure, HLD, HTN, IBS, Kidney problems, migraine, osteoarthritis, osteoporosis, RA, ocular migraines, back pain are also affecting patient's functional outcome.   REHAB POTENTIAL: Fair    CLINICAL DECISION MAKING: Evolving/moderate complexity  EVALUATION COMPLEXITY: Moderate   GOALS: Goals reviewed with patient? Yes  SHORT TERM GOALS: Target date: 04/19/2024    Patient will be independent in home exercise program to improve strength/mobility for better functional independence with ADLs. Baseline:7/23: HEP given 9/16: compliant 1x/week 4/3: improving compliance Goal status: On going     LONG TERM GOALS: Target date: 05/17/2024    Patient will increase FOTO score to equal to or greater than  57%   to demonstrate statistically significant improvement in mobility and quality of life.  Baseline: 7/22: 46 9/16: 50% 10/7: 40% 12/10: 57% Goal status: MET  2.  Patient will improve shoulder AROM to > 140 degrees of flexion, scaption, and abduction for improved ability to perform overhead activities. Baseline: 7/22: see above 9/16: see above 10/7 : defer to exacerbation 12/10: shoulder abduction >140, flexion < 140 , assess scaption next visit 2/6: flexion >140, abduction >140 scaption >140 Goal status: MET  3.  Patient will demonstrate adequate shoulder ROM and strength to be able to shave and dress independently with pain less than 3/10. Baseline: 7/23: 6/10 pain with dressing, limited ROM, unable to test strength 9/16: 3/10 pain with dressing  Goal status:  MET  4.  Patient will decrease Quick DASH score by > 8 points demonstrating reduced self-reported upper extremity disability for L and R shoulder  Baseline: 7/23: L shoulder 50% 9/16: R=54.54 % L 66% L 81% R 24% 12/10: R: 43.2% L: 56.8% 2/6: L 22%  R 34%  Goal status: MET  5.  Patient will be able to eat an entire meal without resting arm demonstrating improved functional strength Baseline: 7/23: has to rest while eating due to pain. 9/16: able to make  halfway through meal 10/7: can make it about halfway through meal, some meals can get 3/4s through meal 12/10: improved, still has to rest every once in a while 2/6: can get halfway to 2/3rds of the meal.  Goal status: deferred at this time to focus on back/leg  6.  Patient will be able to stand 15 minutes without her LLE going numb allowing for ADLS such as showering and iADLs such as cooking Baseline: 5 minutes 12/10: will assess next visit 2/6: 10 minutes 4/3: 10 minutes Goal status: IN PROGRESS  7. Patient will increase six minute walk test distance to >1500 for progression to community ambulator and improve gait ability Baseline: 1160 ft  12/10: 1010 ft 2/6: 1140 ft  4/3: 1201 ft  Goal status: IN PROGRESS  8  Patient will reduce modified Oswestry score to <20 as to demonstrate minimal disability with ADLs including improved sleeping tolerance, walking/sitting tolerance etc for better mobility with ADLs.  Baseline: 2/6: 60% 4/3: 50%  Goal status: In progress  PLAN:  PT FREQUENCY: 2x/week  PT DURATION: 8 weeks  PLANNED INTERVENTIONS: Therapeutic exercises, Therapeutic activity, Neuromuscular re-education, Balance training, Gait training, Patient/Family education, Self Care, Joint mobilization, Vestibular training, Canalith repositioning, Visual/preceptual remediation/compensation, Dry Needling, Electrical stimulation, Spinal mobilization, Cryotherapy, Moist heat, Compression bandaging, scar mobilization, Splintting, Taping, Traction,  Ultrasound, Biofeedback, Ionotophoresis 4mg /ml Dexamethasone , Manual therapy, and Re-evaluation  PLAN FOR NEXT SESSION:  Continue with LUE strengthening  L ankle strengthening, distraction, core activation, reassess scaption and flexion ROM, make HEP    Latishia Suitt  Sadie Crank ,DPT Physical Therapist- [facility]  [facility]    05/16/24, 5:04 PM

## 2024-05-17 ENCOUNTER — Ambulatory Visit: Payer: Medicare HMO

## 2024-05-17 DIAGNOSIS — R2689 Other abnormalities of gait and mobility: Secondary | ICD-10-CM

## 2024-05-17 DIAGNOSIS — M5459 Other low back pain: Secondary | ICD-10-CM

## 2024-05-17 DIAGNOSIS — R293 Abnormal posture: Secondary | ICD-10-CM

## 2024-05-17 DIAGNOSIS — M25612 Stiffness of left shoulder, not elsewhere classified: Secondary | ICD-10-CM | POA: Diagnosis not present

## 2024-05-17 NOTE — Therapy (Signed)
 OUTPATIENT PHYSICAL THERAPY TREATMENT      Patient Name: Rachel Torres MRN: [id number] DOB:[birth year], 68 y.o., female Today's Date: 05/21/2024  END OF SESSION:  PT End of Session - 05/21/24 1058     Visit Number 37    Number of Visits 52    Date for PT Re-Evaluation 07/12/24    Authorization Type [employer]  Medicare HMO    Authorization Time Period 01/26/24-03/22/24    Progress Note Due on Visit 10    PT Start Time 1101    PT Stop Time 1144    PT Time Calculation (min) 43 min    Activity Tolerance Patient tolerated treatment well;No increased pain    Behavior During Therapy WFL for tasks assessed/performed                         Past Medical History:  Diagnosis Date   AAA (abdominal aortic aneurysm) (HCC)    a. Chronic w/o evidence of aneurysmal dil on CTA 01/2016.   Alcohol abuse    Allergy    Anemia    Anxiety    Asthma    CAD (coronary artery disease)    a. 08/2010 s/p CABG x 1 (VG->RCA) @ time of Ao dissection repair; b. 01/2016 Lexiscan  MV: mid antsept/apical defect w/ ? peri-infarct ischemia-->likely attenuation-->Med Rx.   Chewing difficulty    Chronic combined systolic (congestive) and diastolic (congestive) heart failure (HCC)    a. 2012 EF 30-35%; b. 04/2015 EF 40-45%; c. 01/2016 Echo: Ef 55-65%, nl AoV bioprosthesis, nl RV, nl PASP.   Constipation    Depression    Edema of both lower extremities    Gallbladder sludge    GERD (gastroesophageal reflux disease)    Heart failure (HCC)    Heart valve problem    History of Bicuspid Aortic Valve    a. 08/2010 s/p AVR @ time of Ao dissection repair; b. 01/2016 Echo: Ef 55-65%, nl AoV bioprosthesis, nl RV, nl PASP.   Hx of repair of dissecting thoracic aortic aneurysm, Stanford type A    a. 08/2010 s/p repair with AVR and VG->RCA; b. 01/2016 CTA: stable appearance of Asc Thoracic Aortic graft. Opacification of flase lumen of chronic abd Ao dissection w/ retrograde flow through lumbar arteries. No aneurysmal  dil of flase lumen.   Hyperlipidemia    Hypertension    Hypertensive heart disease    IBS (irritable bowel syndrome)    Internal hemorrhoids    Kidney problem    Marfan syndrome    pt denies   Migraine    Obstructive sleep apnea    Osteoarthritis    Osteoporosis    Palpitations    Pneumonia    RA (rheumatoid arthritis) (HCC)    a. Followed by Dr. Chaim Colony   Past Surgical History:  Procedure Laterality Date   ABDOMINAL AORTIC ANEURYSM REPAIR     CARDIAC CATHETERIZATION  2001   CARDIOVERSION N/A 04/04/2024   Procedure: CARDIOVERSION;  Surgeon: Devorah Fonder, MD;  Location: [facility] ORS;  Service: Cardiovascular;  Laterality: N/A;   CESAREAN SECTION  1986 & 1991   CHOLECYSTECTOMY     COLONOSCOPY     COLONOSCOPY WITH PROPOFOL  N/A 01/19/2024   Procedure: COLONOSCOPY WITH PROPOFOL ;  Surgeon: Luke Salaam, MD;  Location: [facility] ENDOSCOPY;  Service: Gastroenterology;  Laterality: N/A;   ESOPHAGOGASTRODUODENOSCOPY (EGD) WITH PROPOFOL  N/A 05/08/2020   Procedure: ESOPHAGOGASTRODUODENOSCOPY (EGD) WITH PROPOFOL ;  Surgeon: Irby Mannan, MD;  Location: [facility] ENDOSCOPY;  Service: Endoscopy;  Laterality: N/A;   FRACTURE SURGERY Right    shoulder replacement   HEMORRHOID BANDING     ORIF DISTAL RADIUS FRACTURE Left    POLYPECTOMY  01/19/2024   Procedure: POLYPECTOMY;  Surgeon: Luke Salaam, MD;  Location: [facility] ENDOSCOPY;  Service: Gastroenterology;;   UPPER GASTROINTESTINAL ENDOSCOPY     Patient Active Problem List   Diagnosis Date Noted   Typical atrial flutter (HCC) 04/04/2024   Adenomatous polyp of colon 01/19/2024   Heart failure with improved ejection fraction (HFimpEF) (HCC) 01/18/2024   Abnormal metabolism 11/14/2023   Weight regain after inital weight loss 11/14/2023   Multiple closed tarsal fractures of left foot, sequela 07/12/2023   Arm contusion, right, sequela 07/12/2023   Lumbar disc disease 07/12/2023   PAF (paroxysmal atrial fibrillation) (HCC) 06/09/2023   Paresthesia  06/03/2023   Obesity, Beginning BMI 35.45 02/17/2023   Tinnitus 01/14/2023   Change in vision 01/14/2023   Encounter for screening colonoscopy 01/14/2023   Personal history of fall 01/14/2023   Problem related to unspecified psychosocial circumstances 12/28/2022   Person encountering health services to consult on behalf of another person 12/28/2022   Palpitation 12/10/2022   Fatigue 10/26/2022   B12 deficiency 10/26/2022   Right shoulder pain 09/15/2022   Depression 09/14/2022   Other constipation 05/12/2022   Medicare annual wellness visit, initial 07/17/2021   TMJ (dislocation of temporomandibular joint) 07/17/2021   Encounter for screening mammogram for breast cancer 06/05/2021   Diarrhea 04/11/2020   Epigastric pain 02/19/2020   Dark stools 02/19/2020   History of atrial fibrillation 02/05/2019   SOB (shortness of breath) 01/27/2019   Irregular surface of cornea 12/25/2018   Acute cough 09/08/2017   History of HPV infection 12/01/2016   Screening mammogram, encounter for 11/29/2016   Estrogen deficiency 11/29/2016   Encounter for routine gynecological examination 11/29/2016   Grade III hemorrhoids 10/06/2016   Tachycardia 08/14/2016   Hemorrhoids 08/13/2016   Atypical migraine 08/03/2016   CAD (coronary artery disease)    AAA (abdominal aortic aneurysm) (HCC)    Chronic combined systolic (congestive) and diastolic (congestive) heart failure (HCC)    Prediabetes 02/11/2016   Generalized anxiety disorder 12/08/2015   Panic attacks 07/28/2015   Chest pain 07/09/2013   Sensorineural hearing loss 03/27/2013   Vitamin D  deficiency 07/03/2012   H/O aortic dissection 05/12/2012   Routine general medical examination at a health care facility 09/20/2011   Atypical chest pain 09/15/2011   S/P AVR (aortic valve replacement) 01/07/2011   CORONARY ARTERY BYPASS GRAFT, HX OF 01/07/2011   Arthritis 12/20/2010   Back pain 12/15/2010   H/O dissecting abdominal aortic aneurysm repair  08/24/2010   IRRITABLE BOWEL SYNDROME 09/09/2009   Obstructive sleep apnea 08/04/2009   External hemorrhoid 06/26/2009   Osteoporosis 01/09/2009   Hyperlipidemia 07/26/2007   Depression with anxiety 07/26/2007   Essential hypertension 07/26/2007   Allergic rhinitis 07/26/2007   Asthma 07/26/2007   GERD 07/26/2007   Osteoarthritis 07/26/2007   Ocular migraine 07/26/2007   Mild intermittent asthma without complication 07/26/2007    PCP: Clemens Curt MD   REFERRING PROVIDER: Bert Britain  REFERRING DIAG: L rotator cuff   THERAPY DIAG:  Abnormal posture  Other abnormalities of gait and mobility  Other low back pain  Rationale for Evaluation and Treatment: Rehabilitation  ONSET DATE: Feb 2024  SUBJECTIVE:  SUBJECTIVE STATEMENT:   Patient had one episode over the weekend where numbness went below the knee.   Hand dominance: Left  PERTINENT HISTORY: Aamiyah Derrick is a 66yoF familiar to our services. Pt referred to PT for strengthening of chronic left shoulder DJD, known remote RC injury. Recently fell and broke four metatarsals, since cleared from use of CAM rocker. She was treated successfully in past for right shoulder pain and has past medical history of Right shoulder surgery replacement. 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 PMH aorta type A dissection in sept 2011 s/p aortic valve replacement, bypass of R coronary artery, anemia, anxiety, CAD, CHF, depression, GERD, Heart failure, HLD, HTN, IBS, migraine, osteoarthritis, osteoporosis, RA, back pain.    Back pain:  Patient has new referral for radiculopathy, lumbar region. MRI showed degeneration of L1-2 and L2-3 with some disc space collapse and minor retrolisthesis of L2-3. Issue began in February, felt numbness in L thigh. No  cause known, but she was at a museum and noticed it was numb. Sometimes L foot numbness and toe numbness of 2nd and 3rd toe causing tripping. This caused her to trip July 4th and break five metatarsals. Some minor pain in the low pain noted. Pain noted to be 2-3/10 Pain in hip 3-4/10 of L hip.    PAIN:  Are you having pain? Yes, 3/10 pain in lateral deltoids in the shoulder.  PRECAUTIONS: None  FALLS:  Has patient fallen in last 6 months? Yes. Number of falls 2  PATIENT GOALS:to lift more than 3lb with LUE, be able to eat a meal without arm being tired.    OBJECTIVE:  DIAGNOSTIC FINDINGS:  IMPRESSION: Mild degenerative change in the cervical spine without acute fracture or traumatic subluxation.   IMPRESSION: Full width, full width tears of the supraspinatus and infraspinatus tendons with retraction to the glenoid. Grade 2/3 infraspinatus atrophy.   Moderate tendinosis of the distal subscapularis tendon with articular sided fraying of the cephalad fibers.   Moderate intra-articular long head biceps tendinosis with fraying and possible partial tearing.   Mild glenohumeral and moderate AC joint osteoarthritis.   Degenerative superior labral tearing anteriorly and posteriorly.   PATIENT SURVEYS:  Quick Dash 50%  and FOTO 46%   COGNITION: Overall cognitive status: Within functional limits for tasks assessed                                  SENSATION: WFL   POSTURE: Slight rounded shoulders and shoulder elevation    UPPER EXTREMITY ROM:    Active ROM Right eval 9/16: recert RUE Left eval 9/16 RECERT LUE  Shoulder flexion 64 122 91 92  Shoulder extension        Shoulder abduction 95 88 52 82  Shoulder adduction        Shoulder internal rotation Unable to test at 90 61 degrees 5 40  Shoulder external rotation 18at side of body -5 degrees at 90 degrees 34 35  Elbow flexion [facility]  WFL   Elbow extension [facility]  WFL   Wrist flexion        Wrist extension        Wrist  ulnar deviation        Wrist radial deviation        Wrist pronation        Wrist supination        (Blank rows = not tested)  UPPER EXTREMITY MMT:   MMT Right eval Left eval  Shoulder flexion      Shoulder extension      Shoulder abduction      Shoulder adduction      Shoulder internal rotation      Shoulder external rotation      Middle trapezius      Lower trapezius      Elbow flexion      Elbow extension      Wrist flexion      Wrist extension      Wrist ulnar deviation      Wrist radial deviation      Wrist pronation      Wrist supination      Grip strength (lbs) 30 35  (Blank rows = not tested) Unable to test strength due to pain with motion   TRUNK ROM: Trunk Flexion 65  Trunk Extension 7  Trunk R SB Limited 25%  Trunk L SB WFL  Trunk R rotation WFL  Trunk L rotation Limited 25%    Ankle ROM Seated: Motion: AROM seated Right Left  Plantarflexion 4+ 3  Dorsiflexion 4+ 4  Inversion 4 3+  Eversion 4+ 3+    SHOULDER SPECIAL TESTS: Impingement tests:  unable to test due to pain in starting position SLAP lesions:  unable to test due to pain in starting position Instability tests:  unable to test due to pain in starting position Rotator cuff assessment:  unable to test due to pain in starting position Biceps assessment:  unable to test due to pain in starting position   LOW BACK SPECIAL TEST: SLR: negative  Scour test: negative FABER: negative Pelvic compression/distraction: negative  Slump test positive LLE  6 min walk test: 1160 ft increased left foot drag and antalgic gait pattern with foot eversion    JOINT MOBILITY TESTING:  AP: painful PA: painful Inferior: painful Distraction: reduces pain   Back mobilization hypomobile and painful to grade II mobilizations    PALPATION:  Tenderness to all joint regions of GH     TODAY'S TREATMENT: DATE: 05/21/24   TherEx Supine: -bring leg on top of other leg into figure four position x 30 sec ea    -figure four stretch 60 seconds each LE  -trA contraction 10 x 5 sec  -trA with swiss ball 10x 3 second holds  -TrA with UE/LE raises 10x for dead bug  Childs pose x 30 sec    Manual Distraction in figure four position 3x30 seconds    NMR: BAPs board: -df/pf 15x -inversion/eversion 15x -clockwise 15x; counterclockwise 15x  -static hold 30 seconds x  2 trials       PATIENT EDUCATION: Education details: goals, POC  Person educated: Patient Education method: Explanation, Demonstration, Tactile cues, Verbal cues, and Handouts Education comprehension: verbalized understanding, returned demonstration, verbal cues required, and tactile cues required  HOME EXERCISE PROGRAM: Access Code: [email or web address] URL: [email or web address] Date: 05/21/2024 Prepared by: Carmela Piechowski  Exercises - Hooklying Transversus Abdominis Palpation  - 1 x daily - 7 x weekly - 2 sets - 10 reps - 5 hold - Supine March  - 1 x daily - 7 x weekly - 2 sets - 10 reps - 5 hold - Seated Ankle Alphabet  - 1 x daily - 7 x weekly - 2 sets - 1 reps - 5 hold - Standing 'L' Stretch at Counter  - 1 x daily - 7 x weekly - 2 sets - 2 reps - 30  hold  Access Code: [email or web address] URL: [email or web address] Date: 10/03/2023 Prepared by: Jimeka Balan  Exercises - Seated Scapular Retraction  - 1 x daily - 7 x weekly - 2 sets - 10 reps - 5 hold - Neck Sidebending  - 1 x daily - 7 x weekly - 2 sets - 2 reps - 30 hold - Supine Isometric Shoulder Extension with Towel  - 1 x daily - 7 x weekly - 2 sets - 10 reps - 5 hold - Supine Isometric Shoulder Adduction with Towel Roll  - 1 x daily - 7 x weekly - 2 sets - 10 reps - 5 hold - Seated Bilateral Shoulder Flexion Towel Slide at Table Top  - 1 x daily - 7 x weekly - 1 sets - 3 reps - 30 hold - Supine Posterior Pelvic Tilt  - 1 x daily - 7 x weekly - 2 sets - 5 reps - 30 hold - Supine LE Neural Mobilization  - 1 x daily - 7 x weekly - 2 sets - 10 reps - 5 hold -  Seated Ankle Alphabet  - 1 x daily - 7 x weekly - 2 sets - 1 reps - 5 hold  ASSESSMENT:  CLINICAL IMPRESSION:   Patient reports decreased thoracic and lumbar pain with SAD I figure four position. Patient is eager to progress her painfree mobility. Core activation continues to be area of progress. HEP re-printed and given to patient with patient demonstrating understanding.  Pt will continue to benefit from skilled therapy to address remaining deficits in order to improve overall QoL and return to PLOF.     OBJECTIVE IMPAIRMENTS: cardiopulmonary status limiting activity, decreased activity tolerance, decreased mobility, increased fascial restrictions, impaired perceived functional ability, increased muscle spasms, impaired flexibility, impaired UE functional use, improper body mechanics, postural dysfunction, and pain.   ACTIVITY LIMITATIONS: carrying, lifting, bed mobility, bathing, dressing, self feeding, reach over head, and caring for others  PARTICIPATION LIMITATIONS: meal prep, cleaning, laundry, personal finances, interpersonal relationship, driving, shopping, community activity, yard work, church, and caregiving for husband  PERSONAL FACTORS: Age, Fitness, Past/current experiences, Time since onset of injury/illness/exacerbation, and 3+ comorbidities: aorta type A dissection in sept 2011 with aortic valve replacement bypass of R coronary artery, AAA, anemia, anxiety, CAD, CHF, depression, GERD, Heart failure, HLD, HTN, IBS, Kidney problems, migraine, osteoarthritis, osteoporosis, RA, ocular migraines, back pain are also affecting patient's functional outcome.   REHAB POTENTIAL: Fair    CLINICAL DECISION MAKING: Evolving/moderate complexity  EVALUATION COMPLEXITY: Moderate   GOALS: Goals reviewed with patient? Yes  SHORT TERM GOALS: Target date: 04/19/2024    Patient will be independent in home exercise program to improve strength/mobility for better functional independence with  ADLs. Baseline:7/23: HEP given 9/16: compliant 1x/week 4/3: improving compliance 5/29: compliant Goal status: MET    LONG TERM GOALS: Target date: 07/12/2024      Patient will increase FOTO score to equal to or greater than  57%   to demonstrate statistically significant improvement in mobility and quality of life.  Baseline: 7/22: 46 9/16: 50% 10/7: 40% 12/10: 57% Goal status: MET  2.  Patient will improve shoulder AROM to > 140 degrees of flexion, scaption, and abduction for improved ability to perform overhead activities. Baseline: 7/22: see above 9/16: see above 10/7 : defer to exacerbation 12/10: shoulder abduction >140, flexion < 140 , assess scaption next visit 2/6: flexion >140, abduction >140 scaption >140 Goal status: MET  3.  Patient will demonstrate adequate shoulder ROM  and strength to be able to shave and dress independently with pain less than 3/10. Baseline: 7/23: 6/10 pain with dressing, limited ROM, unable to test strength 9/16: 3/10 pain with dressing  Goal status: MET  4.  Patient will decrease Quick DASH score by > 8 points demonstrating reduced self-reported upper extremity disability for L and R shoulder  Baseline: 7/23: L shoulder 50% 9/16: R=54.54 % L 66% L 81% R 24% 12/10: R: 43.2% L: 56.8% 2/6: L 22%  R 34%  Goal status: MET  5.  Patient will be able to eat an entire meal without resting arm demonstrating improved functional strength Baseline: 7/23: has to rest while eating due to pain. 9/16: able to make halfway through meal 10/7: can make it about halfway through meal, some meals can get 3/4s through meal 12/10: improved, still has to rest every once in a while 2/6: can get halfway to 2/3rds of the meal. 5/29:  2/3rds of meal  Goal status: deferred at this time to focus on back/leg  6.  Patient will be able to stand 15 minutes without her LLE going numb allowing for ADLS such as showering and iADLs such as cooking Baseline: 5 minutes 12/10: will assess  next visit 2/6: 10 minutes 4/3: 10 minutes 5/29: 5-8 minutes Goal status: IN PROGRESS  7. Patient will increase six minute walk test distance to >1500 for progression to community ambulator and improve gait ability Baseline: 1160 ft  12/10: 1010 ft 2/6: 1140 ft  4/3: 1201 ft 5/29: 774 ft stopped with 1 min 35 seconds remaining due to shortness of breath Goal status: IN PROGRESS  8  Patient will reduce modified Oswestry score to <20 as to demonstrate minimal disability with ADLs including improved sleeping tolerance, walking/sitting tolerance etc for better mobility with ADLs.  Baseline: 2/6: 60% 4/3: 50%  Goal status: In progress   9  Patient will improve df strength by 2lb for improved functional ambulation with decreased foot drag  Baseline: 5/29: L 4.2 R 6.9 Goal status: NEW PLAN:  PT FREQUENCY: 2x/week  PT DURATION: 8 weeks  PLANNED INTERVENTIONS: Therapeutic exercises, Therapeutic activity, Neuromuscular re-education, Balance training, Gait training, Patient/Family education, Self Care, Joint mobilization, Vestibular training, Canalith repositioning, Visual/preceptual remediation/compensation, Dry Needling, Electrical stimulation, Spinal mobilization, Cryotherapy, Moist heat, Compression bandaging, scar mobilization, Splintting, Taping, Traction, Ultrasound, Biofeedback, Ionotophoresis 4mg /ml Dexamethasone , Manual therapy, and Re-evaluation  PLAN FOR NEXT SESSION:  Continue with LUE strengthening  L ankle strengthening, distraction, core activation, reassess scaption and flexion ROM, make HEP    Ibrahem Volkman  Sadie Crank ,DPT Physical Therapist- [facility]  [facility]    05/21/24, 12:37 PM

## 2024-05-17 NOTE — Telephone Encounter (Signed)
 Contacted [facility] and informed them that pt had her sleep study ordered by [facility] and recommended they contact their office.

## 2024-05-21 ENCOUNTER — Ambulatory Visit: Payer: Medicare HMO | Attending: Orthopedic Surgery

## 2024-05-21 DIAGNOSIS — R2689 Other abnormalities of gait and mobility: Secondary | ICD-10-CM | POA: Insufficient documentation

## 2024-05-21 DIAGNOSIS — R293 Abnormal posture: Secondary | ICD-10-CM | POA: Diagnosis not present

## 2024-05-21 DIAGNOSIS — M25612 Stiffness of left shoulder, not elsewhere classified: Secondary | ICD-10-CM | POA: Diagnosis not present

## 2024-05-21 DIAGNOSIS — M5459 Other low back pain: Secondary | ICD-10-CM | POA: Insufficient documentation

## 2024-05-22 ENCOUNTER — Ambulatory Visit (INDEPENDENT_AMBULATORY_CARE_PROVIDER_SITE_OTHER): Payer: Medicare HMO | Admitting: Psychology

## 2024-05-22 DIAGNOSIS — F331 Major depressive disorder, recurrent, moderate: Secondary | ICD-10-CM

## 2024-05-22 NOTE — Progress Notes (Signed)
 [facility] Counselor/Therapist Progress Note  Patient ID: Rachel Torres, MRN: [id number],    Date: 05/22/2024  Time Spent: 10:00am-10:55am  55 minutes   Treatment Type: Individual Therapy  Reported Symptoms: stress, overwhelm  Mental Status Exam: Appearance:  Casual     Behavior: Appropriate  Motor: Normal  Speech/Language:  Normal Rate  Affect: Appropriate  Mood: normal  Thought process: normal  Thought content:   WNL  Sensory/Perceptual disturbances:   WNL  Orientation: oriented to person, place, time/date, and situation  Attention: Good  Concentration: Good  Memory: WNL  Fund of knowledge:  Good  Insight:   Good  Judgment:  Good  Impulse Control: Good   Risk Assessment: Danger to Self:  No Self-injurious Behavior: No Danger to Others: No Duty to Warn:no Physical Aggression / Violence:No  Access to Firearms a concern: No  Gang Involvement:No   Subjective: Pt present for face-to-face individual therapy via video.  Pt consents to telehealth video session and is aware of limitations and benefits of virtual sessions.   Location of pt: home Location of therapist: home office.   Pt talked about her trip this week to see her son and grandson.  She is looking forward to it and to having a break from [place].  [name] will be going on a men's retreat.  Pt gets anxious preparing for trips bc [name] gets anxious.   Addressed the stress of caregiving for [name].     Pt gets frustrated with [name] cognitive challenges.  Addressed how this impacts pt.  Pt worries about how [name] will progress.  Worked on Optician, dispensing.  Pt talked about her self esteem.  She is intentional about trying to keep her self talk positive.   She works on present moment mindfulness.   Worked on self care strategies. Provided supportive therapy.    Interventions: Cognitive Behavioral Therapy and Insight-Oriented  Diagnosis:  F33.1   Plan of Care: Recommend ongoing  therapy.   Pt participated in setting treatment goals.   Pt needs a place to talk about stressors.  Plan to meet every week.  Pt agrees with treatment plan.    Treatment Plan (target date: 03/27/2025) Client Abilities/Strengths  Pt is bright, engaging, and motivated for therapy.   Client Treatment Preferences  Individual therapy.  Client Statement of Needs  Improve coping skills.  Have a place to talk about stressors.   Symptoms  Depressed or irritable mood. Unresolved grief issues.  Problems Addressed  Unipolar Depression Goals 1. Alleviate depressive symptoms and return to previous level of effective functioning. 2. Appropriately grieve the loss in order to normalize mood and to return to previously adaptive level of functioning. Objective Learn and implement behavioral strategies to overcome depression. Target Date: 2025-03-27 Frequency: weekly  Progress: 45 Modality: individual  Related Interventions Engage the client in "behavioral activation," increasing his/her activity level and contact with sources of reward, while identifying processes that inhibit activation.  Use behavioral techniques such as instruction, rehearsal, role-playing, role reversal, as needed, to facilitate activity in the client's daily life; reinforce success. Assist the client in developing skills that increase the likelihood of deriving pleasure from behavioral activation (e.g., assertiveness skills, developing an exercise plan, less internal/more external focus, increased social involvement); reinforce success. Objective Identify important people in life, past and present, and describe the quality, good and poor, of those relationships. Target Date: 2025-03-27 Frequency: weekly  Progress: 45 Modality: individual  Related Interventions Conduct Interpersonal Therapy beginning with the assessment of  the client's "interpersonal inventory" of important past and present relationships; develop a case formulation  linking depression to grief, interpersonal role disputes, role transitions, and/or interpersonal deficits). Objective Learn and implement problem-solving and decision-making skills. Target Date: 2025-03-27 Frequency: weekly  Progress: 45 Modality: individual  Related Interventions Conduct Problem-Solving Therapy using techniques such as psychoeducation, modeling, and role-playing to teach client problem-solving skills (i.e., defining a problem specifically, generating possible solutions, evaluating the pros and cons of each solution, selecting and implementing a plan of action, evaluating the efficacy of the plan, accepting or revising the plan); role-play application of the problem-solving skill to a real life issue. Encourage in the client the development of a positive problem orientation in which problems and solving them are viewed as a natural part of life and not something to be feared, despaired, or avoided. 3. Develop healthy interpersonal relationships that lead to the alleviation and help prevent the relapse of depression. 4. Develop healthy thinking patterns and beliefs about self, others, and the world that lead to the alleviation and help prevent the relapse of depression. 5. Recognize, accept, and cope with feelings of depression. Diagnosis F33.1  Conditions For Discharge Achievement of treatment goals and objectives   Willey Harrier, LCSW

## 2024-05-24 ENCOUNTER — Other Ambulatory Visit

## 2024-05-24 NOTE — Therapy (Incomplete)
 OUTPATIENT PHYSICAL THERAPY TREATMENT      Patient Name: Rachel Torres MRN: [id number] DOB:1956/12/01, 68 y.o., female Today's Date: 05/24/2024  END OF SESSION:                Past Medical History:  Diagnosis Date   AAA (abdominal aortic aneurysm) (HCC)    a. Chronic w/o evidence of aneurysmal dil on CTA 01/2016.   Alcohol abuse    Allergy    Anemia    Anxiety    Asthma    CAD (coronary artery disease)    a. 08/2010 s/p CABG x 1 (VG->RCA) @ time of Ao dissection repair; b. 01/2016 Lexiscan  MV: mid antsept/apical defect w/ ? peri-infarct ischemia-->likely attenuation-->Med Rx.   Chewing difficulty    Chronic combined systolic (congestive) and diastolic (congestive) heart failure (HCC)    a. 2012 EF 30-35%; b. 04/2015 EF 40-45%; c. 01/2016 Echo: Ef 55-65%, nl AoV bioprosthesis, nl RV, nl PASP.   Constipation    Depression    Edema of both lower extremities    Gallbladder sludge    GERD (gastroesophageal reflux disease)    Heart failure (HCC)    Heart valve problem    History of Bicuspid Aortic Valve    a. 08/2010 s/p AVR @ time of Ao dissection repair; b. 01/2016 Echo: Ef 55-65%, nl AoV bioprosthesis, nl RV, nl PASP.   Hx of repair of dissecting thoracic aortic aneurysm, Stanford type A    a. 08/2010 s/p repair with AVR and VG->RCA; b. 01/2016 CTA: stable appearance of Asc Thoracic Aortic graft. Opacification of flase lumen of chronic abd Ao dissection w/ retrograde flow through lumbar arteries. No aneurysmal dil of flase lumen.   Hyperlipidemia    Hypertension    Hypertensive heart disease    IBS (irritable bowel syndrome)    Internal hemorrhoids    Kidney problem    Marfan syndrome    pt denies   Migraine    Obstructive sleep apnea    Osteoarthritis    Osteoporosis    Palpitations    Pneumonia    RA (rheumatoid arthritis) (HCC)    a. Followed by Dr. Chaim Colony   Past Surgical History:  Procedure Laterality Date   ABDOMINAL AORTIC ANEURYSM REPAIR      CARDIAC CATHETERIZATION  2001   CARDIOVERSION N/A 04/04/2024   Procedure: CARDIOVERSION;  Surgeon: Devorah Fonder, MD;  Location: [facility] ORS;  Service: Cardiovascular;  Laterality: N/A;   CESAREAN SECTION  1986 & 1991   CHOLECYSTECTOMY     COLONOSCOPY     COLONOSCOPY WITH PROPOFOL  N/A 01/19/2024   Procedure: COLONOSCOPY WITH PROPOFOL ;  Surgeon: Luke Salaam, MD;  Location: [facility] ENDOSCOPY;  Service: Gastroenterology;  Laterality: N/A;   ESOPHAGOGASTRODUODENOSCOPY (EGD) WITH PROPOFOL  N/A 05/08/2020   Procedure: ESOPHAGOGASTRODUODENOSCOPY (EGD) WITH PROPOFOL ;  Surgeon: Irby Mannan, MD;  Location: [facility] ENDOSCOPY;  Service: Endoscopy;  Laterality: N/A;   FRACTURE SURGERY Right    shoulder replacement   HEMORRHOID BANDING     ORIF DISTAL RADIUS FRACTURE Left    POLYPECTOMY  01/19/2024   Procedure: POLYPECTOMY;  Surgeon: Luke Salaam, MD;  Location: [facility] ENDOSCOPY;  Service: Gastroenterology;;   UPPER GASTROINTESTINAL ENDOSCOPY     Patient Active Problem List   Diagnosis Date Noted   Typical atrial flutter (HCC) 04/04/2024   Adenomatous polyp of colon 01/19/2024   Heart failure with improved ejection fraction (HFimpEF) (HCC) 01/18/2024   Abnormal metabolism 11/14/2023   Weight regain after inital weight loss 11/14/2023  Multiple closed tarsal fractures of left foot, sequela 07/12/2023   Arm contusion, right, sequela 07/12/2023   Lumbar disc disease 07/12/2023   PAF (paroxysmal atrial fibrillation) (HCC) 06/09/2023   Paresthesia 06/03/2023   Obesity, Beginning BMI 35.45 02/17/2023   Tinnitus 01/14/2023   Change in vision 01/14/2023   Encounter for screening colonoscopy 01/14/2023   Personal history of fall 01/14/2023   Problem related to unspecified psychosocial circumstances 12/28/2022   Person encountering health services to consult on behalf of another person 12/28/2022   Palpitation 12/10/2022   Fatigue 10/26/2022   B12 deficiency 10/26/2022   Right shoulder pain 09/15/2022    Depression 09/14/2022   Other constipation 05/12/2022   Medicare annual wellness visit, initial 07/17/2021   TMJ (dislocation of temporomandibular joint) 07/17/2021   Encounter for screening mammogram for breast cancer 06/05/2021   Diarrhea 04/11/2020   Epigastric pain 02/19/2020   Dark stools 02/19/2020   History of atrial fibrillation 02/05/2019   SOB (shortness of breath) 01/27/2019   Irregular surface of cornea 12/25/2018   Acute cough 09/08/2017   History of HPV infection 12/01/2016   Screening mammogram, encounter for 11/29/2016   Estrogen deficiency 11/29/2016   Encounter for routine gynecological examination 11/29/2016   Grade III hemorrhoids 10/06/2016   Tachycardia 08/14/2016   Hemorrhoids 08/13/2016   Atypical migraine 08/03/2016   CAD (coronary artery disease)    AAA (abdominal aortic aneurysm) (HCC)    Chronic combined systolic (congestive) and diastolic (congestive) heart failure (HCC)    Prediabetes 02/11/2016   Generalized anxiety disorder 12/08/2015   Panic attacks 07/28/2015   Chest pain 07/09/2013   Sensorineural hearing loss 03/27/2013   Vitamin D  deficiency 07/03/2012   H/O aortic dissection 05/12/2012   Routine general medical examination at a health care facility 09/20/2011   Atypical chest pain 09/15/2011   S/P AVR (aortic valve replacement) 01/07/2011   CORONARY ARTERY BYPASS GRAFT, HX OF 01/07/2011   Arthritis 12/20/2010   Back pain 12/15/2010   H/O dissecting abdominal aortic aneurysm repair 08/24/2010   IRRITABLE BOWEL SYNDROME 09/09/2009   Obstructive sleep apnea 08/04/2009   External hemorrhoid 06/26/2009   Osteoporosis 01/09/2009   Hyperlipidemia 07/26/2007   Depression with anxiety 07/26/2007   Essential hypertension 07/26/2007   Allergic rhinitis 07/26/2007   Asthma 07/26/2007   GERD 07/26/2007   Osteoarthritis 07/26/2007   Ocular migraine 07/26/2007   Mild intermittent asthma without complication 07/26/2007    PCP: Clemens Curt MD   REFERRING PROVIDER: Bert Britain  REFERRING DIAG: L rotator cuff   THERAPY DIAG:  No diagnosis found.  Rationale for Evaluation and Treatment: Rehabilitation  ONSET DATE: Feb 2024  SUBJECTIVE:  SUBJECTIVE STATEMENT:   ***  Hand dominance: Left  PERTINENT HISTORY: Ndea Kilroy is a 66yoF familiar to our services. Pt referred to PT for strengthening of chronic left shoulder DJD, known remote RC injury. Recently fell and broke four metatarsals, since cleared from use of CAM rocker. She was treated successfully in past for right shoulder pain and has past medical history of Right shoulder surgery replacement. 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 PMH aorta type A dissection in sept 2011 s/p aortic valve replacement, bypass of R coronary artery, anemia, anxiety, CAD, CHF, depression, GERD, Heart failure, HLD, HTN, IBS, migraine, osteoarthritis, osteoporosis, RA, back pain.    Back pain:  Patient has new referral for radiculopathy, lumbar region. MRI showed degeneration of L1-2 and L2-3 with some disc space collapse and minor retrolisthesis of L2-3. Issue began in February, felt numbness in L thigh. No cause known, but she was at a museum and noticed it was numb. Sometimes L foot numbness and toe numbness of 2nd and 3rd toe causing tripping. This caused her to trip July 4th and break five metatarsals. Some minor pain in the low pain noted. Pain noted to be 2-3/10 Pain in hip 3-4/10 of L hip.    PAIN:  Are you having pain? Yes, 3/10 pain in lateral deltoids in the shoulder.  PRECAUTIONS: None  FALLS:  Has patient fallen in last 6 months? Yes. Number of falls 2  PATIENT GOALS:to lift more than 3lb with LUE, be able to eat a meal without arm being tired.    OBJECTIVE:  DIAGNOSTIC FINDINGS:   IMPRESSION: Mild degenerative change in the cervical spine without acute fracture or traumatic subluxation.   IMPRESSION: Full width, full width tears of the supraspinatus and infraspinatus tendons with retraction to the glenoid. Grade 2/3 infraspinatus atrophy.   Moderate tendinosis of the distal subscapularis tendon with articular sided fraying of the cephalad fibers.   Moderate intra-articular long head biceps tendinosis with fraying and possible partial tearing.   Mild glenohumeral and moderate AC joint osteoarthritis.   Degenerative superior labral tearing anteriorly and posteriorly.   PATIENT SURVEYS:  Quick Dash 50%  and FOTO 46%   COGNITION: Overall cognitive status: Within functional limits for tasks assessed                                  SENSATION: WFL   POSTURE: Slight rounded shoulders and shoulder elevation    UPPER EXTREMITY ROM:    Active ROM Right eval 9/16: recert RUE Left eval 9/16 RECERT LUE  Shoulder flexion 64 122 91 92  Shoulder extension        Shoulder abduction 95 88 52 82  Shoulder adduction        Shoulder internal rotation Unable to test at 90 61 degrees 5 40  Shoulder external rotation 18at side of body -5 degrees at 90 degrees 34 35  Elbow flexion [facility]  WFL   Elbow extension [facility]  WFL   Wrist flexion        Wrist extension        Wrist ulnar deviation        Wrist radial deviation        Wrist pronation        Wrist supination        (Blank rows = not tested)   UPPER EXTREMITY MMT:   MMT Right eval Left eval  Shoulder flexion  Shoulder extension      Shoulder abduction      Shoulder adduction      Shoulder internal rotation      Shoulder external rotation      Middle trapezius      Lower trapezius      Elbow flexion      Elbow extension      Wrist flexion      Wrist extension      Wrist ulnar deviation      Wrist radial deviation      Wrist pronation      Wrist supination      Grip strength (lbs) 30 35   (Blank rows = not tested) Unable to test strength due to pain with motion   TRUNK ROM: Trunk Flexion 65  Trunk Extension 7  Trunk R SB Limited 25%  Trunk L SB WFL  Trunk R rotation WFL  Trunk L rotation Limited 25%    Ankle ROM Seated: Motion: AROM seated Right Left  Plantarflexion 4+ 3  Dorsiflexion 4+ 4  Inversion 4 3+  Eversion 4+ 3+    SHOULDER SPECIAL TESTS: Impingement tests:  unable to test due to pain in starting position SLAP lesions:  unable to test due to pain in starting position Instability tests:  unable to test due to pain in starting position Rotator cuff assessment:  unable to test due to pain in starting position Biceps assessment:  unable to test due to pain in starting position   LOW BACK SPECIAL TEST: SLR: negative  Scour test: negative FABER: negative Pelvic compression/distraction: negative  Slump test positive LLE  6 min walk test: 1160 ft increased left foot drag and antalgic gait pattern with foot eversion    JOINT MOBILITY TESTING:  AP: painful PA: painful Inferior: painful Distraction: reduces pain   Back mobilization hypomobile and painful to grade II mobilizations    PALPATION:  Tenderness to all joint regions of GH     TODAY'S TREATMENT: DATE: 05/24/24   TherEx Supine: -bring leg on top of other leg into figure four position x 30 sec ea   -figure four stretch 60 seconds each LE  -trA contraction 10 x 5 sec  -trA with swiss ball 10x 3 second holds  -TrA with UE/LE raises 10x for dead bug  Childs pose x 30 sec    Manual Distraction in figure four position 3x30 seconds    NMR: BAPs board: -df/pf 15x -inversion/eversion 15x -clockwise 15x; counterclockwise 15x  -static hold 30 seconds x  2 trials       PATIENT EDUCATION: Education details: goals, POC  Person educated: Patient Education method: Explanation, Demonstration, Tactile cues, Verbal cues, and Handouts Education comprehension: verbalized understanding,  returned demonstration, verbal cues required, and tactile cues required  HOME EXERCISE PROGRAM: Access Code: [email or web address] URL: [email or web address] Date: 05/21/2024 Prepared by: Audrianna Driskill  Exercises - Hooklying Transversus Abdominis Palpation  - 1 x daily - 7 x weekly - 2 sets - 10 reps - 5 hold - Supine March  - 1 x daily - 7 x weekly - 2 sets - 10 reps - 5 hold - Seated Ankle Alphabet  - 1 x daily - 7 x weekly - 2 sets - 1 reps - 5 hold - Standing 'L' Stretch at Counter  - 1 x daily - 7 x weekly - 2 sets - 2 reps - 30 hold  Access Code: [email or web address] URL: [email or web address] Date: 10/03/2023 Prepared by: Zunaira Lamy  Exercises - Seated  Scapular Retraction  - 1 x daily - 7 x weekly - 2 sets - 10 reps - 5 hold - Neck Sidebending  - 1 x daily - 7 x weekly - 2 sets - 2 reps - 30 hold - Supine Isometric Shoulder Extension with Towel  - 1 x daily - 7 x weekly - 2 sets - 10 reps - 5 hold - Supine Isometric Shoulder Adduction with Towel Roll  - 1 x daily - 7 x weekly - 2 sets - 10 reps - 5 hold - Seated Bilateral Shoulder Flexion Towel Slide at Table Top  - 1 x daily - 7 x weekly - 1 sets - 3 reps - 30 hold - Supine Posterior Pelvic Tilt  - 1 x daily - 7 x weekly - 2 sets - 5 reps - 30 hold - Supine LE Neural Mobilization  - 1 x daily - 7 x weekly - 2 sets - 10 reps - 5 hold - Seated Ankle Alphabet  - 1 x daily - 7 x weekly - 2 sets - 1 reps - 5 hold  ASSESSMENT:  CLINICAL IMPRESSION:   *** Pt will continue to benefit from skilled therapy to address remaining deficits in order to improve overall QoL and return to PLOF.     OBJECTIVE IMPAIRMENTS: cardiopulmonary status limiting activity, decreased activity tolerance, decreased mobility, increased fascial restrictions, impaired perceived functional ability, increased muscle spasms, impaired flexibility, impaired UE functional use, improper body mechanics, postural dysfunction, and pain.   ACTIVITY LIMITATIONS:  carrying, lifting, bed mobility, bathing, dressing, self feeding, reach over head, and caring for others  PARTICIPATION LIMITATIONS: meal prep, cleaning, laundry, personal finances, interpersonal relationship, driving, shopping, community activity, yard work, church, and caregiving for husband  PERSONAL FACTORS: Age, Fitness, Past/current experiences, Time since onset of injury/illness/exacerbation, and 3+ comorbidities: aorta type A dissection in sept 2011 with aortic valve replacement bypass of R coronary artery, AAA, anemia, anxiety, CAD, CHF, depression, GERD, Heart failure, HLD, HTN, IBS, Kidney problems, migraine, osteoarthritis, osteoporosis, RA, ocular migraines, back pain are also affecting patient's functional outcome.   REHAB POTENTIAL: Fair    CLINICAL DECISION MAKING: Evolving/moderate complexity  EVALUATION COMPLEXITY: Moderate   GOALS: Goals reviewed with patient? Yes  SHORT TERM GOALS: Target date: 04/19/2024    Patient will be independent in home exercise program to improve strength/mobility for better functional independence with ADLs. Baseline:7/23: HEP given 9/16: compliant 1x/week 4/3: improving compliance 5/29: compliant Goal status: MET    LONG TERM GOALS: Target date: 07/12/2024      Patient will increase FOTO score to equal to or greater than  57%   to demonstrate statistically significant improvement in mobility and quality of life.  Baseline: 7/22: 46 9/16: 50% 10/7: 40% 12/10: 57% Goal status: MET  2.  Patient will improve shoulder AROM to > 140 degrees of flexion, scaption, and abduction for improved ability to perform overhead activities. Baseline: 7/22: see above 9/16: see above 10/7 : defer to exacerbation 12/10: shoulder abduction >140, flexion < 140 , assess scaption next visit 2/6: flexion >140, abduction >140 scaption >140 Goal status: MET  3.  Patient will demonstrate adequate shoulder ROM and strength to be able to shave and dress  independently with pain less than 3/10. Baseline: 7/23: 6/10 pain with dressing, limited ROM, unable to test strength 9/16: 3/10 pain with dressing  Goal status: MET  4.  Patient will decrease Quick DASH score by > 8 points demonstrating reduced self-reported upper extremity disability for  L and R shoulder  Baseline: 7/23: L shoulder 50% 9/16: R=54.54 % L 66% L 81% R 24% 12/10: R: 43.2% L: 56.8% 2/6: L 22%  R 34%  Goal status: MET  5.  Patient will be able to eat an entire meal without resting arm demonstrating improved functional strength Baseline: 7/23: has to rest while eating due to pain. 9/16: able to make halfway through meal 10/7: can make it about halfway through meal, some meals can get 3/4s through meal 12/10: improved, still has to rest every once in a while 2/6: can get halfway to 2/3rds of the meal. 5/29:  2/3rds of meal  Goal status: deferred at this time to focus on back/leg  6.  Patient will be able to stand 15 minutes without her LLE going numb allowing for ADLS such as showering and iADLs such as cooking Baseline: 5 minutes 12/10: will assess next visit 2/6: 10 minutes 4/3: 10 minutes 5/29: 5-8 minutes Goal status: IN PROGRESS  7. Patient will increase six minute walk test distance to >1500 for progression to community ambulator and improve gait ability Baseline: 1160 ft  12/10: 1010 ft 2/6: 1140 ft  4/3: 1201 ft 5/29: 774 ft stopped with 1 min 35 seconds remaining due to shortness of breath Goal status: IN PROGRESS  8  Patient will reduce modified Oswestry score to <20 as to demonstrate minimal disability with ADLs including improved sleeping tolerance, walking/sitting tolerance etc for better mobility with ADLs.  Baseline: 2/6: 60% 4/3: 50%  Goal status: In progress   9  Patient will improve df strength by 2lb for improved functional ambulation with decreased foot drag  Baseline: 5/29: L 4.2 R 6.9 Goal status: NEW PLAN:  PT FREQUENCY: 2x/week  PT DURATION: 8  weeks  PLANNED INTERVENTIONS: Therapeutic exercises, Therapeutic activity, Neuromuscular re-education, Balance training, Gait training, Patient/Family education, Self Care, Joint mobilization, Vestibular training, Canalith repositioning, Visual/preceptual remediation/compensation, Dry Needling, Electrical stimulation, Spinal mobilization, Cryotherapy, Moist heat, Compression bandaging, scar mobilization, Splintting, Taping, Traction, Ultrasound, Biofeedback, Ionotophoresis 4mg /ml Dexamethasone , Manual therapy, and Re-evaluation  PLAN FOR NEXT SESSION:  Continue with LUE strengthening  L ankle strengthening, distraction, core activation, reassess scaption and flexion ROM, make HEP    Vonnie Ligman  Sadie Crank ,DPT Physical Therapist- [place]  [facility]    05/24/24, 4:31 PM

## 2024-05-28 ENCOUNTER — Ambulatory Visit: Payer: Medicare HMO

## 2024-05-29 ENCOUNTER — Ambulatory Visit (INDEPENDENT_AMBULATORY_CARE_PROVIDER_SITE_OTHER): Payer: Medicare HMO | Admitting: Psychology

## 2024-05-29 DIAGNOSIS — F331 Major depressive disorder, recurrent, moderate: Secondary | ICD-10-CM

## 2024-05-29 NOTE — Progress Notes (Unsigned)
 Electrophysiology Office Follow up Visit Note:    Date:  05/30/2024   ID:  Rachel Torres, DOB [birth year], MRN [id number]  PCP:  Clemens Curt, MD  [facility] Cardiologist:  Belva Boyden, MD  [facility] Electrophysiologist:  Boyce Byes, MD    Interval History:     Rachel Torres is a 68 y.o. female who presents for a follow up visit.   She is currently scheduled for catheter ablation on July 24, 2024.    She most recently saw Ottie Blonder April 17, 2024.  I have previously seen the patient for her atrial fibrillation in 2024.  At that time she decided on a watchful waiting approach.  She has seen Dr. Gollan in the setting of atrial fibrillation/flutter and was symptomatic with increased abdominal fullness.  She is on Eliquis  for stroke prophylaxis.  She had a cardioversion in April.  At the appointment with Ottie Blonder, treatment options for her atrial fibrillation and flutter were discussed including antiarrhythmic drugs and catheter ablation.  The patient wished to avoid medications and wanted to meet to discuss catheter ablation.  Today she is doing well.  She tells me that her heart rhythm has been controlled recently without another breakthrough of atrial fibrillation.  Her husband is having health problems with his Parkinson's and dementia.  They had to go to the ER recently for hematuria.  She continues to take Eliquis  without bleeding issues.       Past medical, surgical, social and family history were reviewed.  ROS:   Please see the history of present illness.    All other systems reviewed and are negative.  EKGs/Labs/Other Studies Reviewed:    The following studies were reviewed today:     EKG Interpretation Date/Time:  Wednesday May 30 2024 10:25:47 EDT Ventricular Rate:  71 PR Interval:  226 QRS Duration:  110 QT Interval:  438 QTC Calculation: 475 R Axis:   -18  Text Interpretation: Sinus rhythm with 1st degree A-V block Incomplete left  bundle branch block Confirmed by Harvie Liner [phone]) on 05/30/2024 10:30:32 AM    Physical Exam:    VS:  BP 120/70 (BP Location: Left Arm)   Pulse 71   Ht 5' 5 (1.651 m)   Wt 221 lb (100.2 kg)   SpO2 94%   BMI 36.78 kg/m     Wt Readings from Last 3 Encounters:  05/30/24 221 lb (100.2 kg)  05/09/24 219 lb 6.4 oz (99.5 kg)  04/17/24 219 lb (99.3 kg)     GEN: no distress CARD: RRR, No MRG RESP: No IWOB. CTAB.      ASSESSMENT:    1. PAF (paroxysmal atrial fibrillation) (HCC)   2. Heart failure with improved ejection fraction (HFimpEF) (HCC)    PLAN:    In order of problems listed above:  #Atrial fibrillation and flutter Symptomatic and associated with decompensated diastolic heart failure.  Rhythm control is indicated.  We discussed treatment options including antiarrhythmic drugs and catheter ablation.  We discussed the catheter ablation again today.  Given her husband's health concerns and given that she is the primary caregiver, she would like to take a conservative approach which I think is actually very reasonable.  For now she will continue taking Eliquis .  We will cancel the ablation.  If she were to experience a significant increase in burden of A-fib, can revisit catheter ablation.  We will set an appointment for 6 months from now.  She will let us  know if  her A-fib changes and we can certainly move that up sooner. Continue Eliquis  for stroke prophylaxis  #Heart failure with improved ejection fraction NYHA class II.  Rhythm control indicated as above. Continue spironolactone , lisinopril , Lasix , Farxiga   Follow-up 6 months with APP.  Signed, Harvie Liner, MD, [facility] 05/30/2024 10:30 AM    Electrophysiology [facility]

## 2024-05-29 NOTE — Progress Notes (Signed)
 [facility] Counselor/Therapist Progress Note  Patient ID: BERANIA PEEDIN, MRN: [id number],    Date: 05/29/2024  Time Spent: 10:00am-10:45am  45 minutes   Treatment Type: Individual Therapy  Reported Symptoms: stress, overwhelm  Mental Status Exam: Appearance:  Casual     Behavior: Appropriate  Motor: Normal  Speech/Language:  Normal Rate  Affect: Appropriate  Mood: normal  Thought process: normal  Thought content:   WNL  Sensory/Perceptual disturbances:   WNL  Orientation: oriented to person, place, time/date, and situation  Attention: Good  Concentration: Good  Memory: WNL  Fund of knowledge:  Good  Insight:   Good  Judgment:  Good  Impulse Control: Good   Risk Assessment: Danger to Self:  No Self-injurious Behavior: No Danger to Others: No Duty to Warn:no Physical Aggression / Violence:No  Access to Firearms a concern: No  Gang Involvement:No   Subjective: Pt present for face-to-face individual therapy via video.  Pt consents to telehealth video session and is aware of limitations and benefits of virtual sessions.   Location of pt: home Location of therapist: home office.   Pt talked about her trip last week to see her son and grandson.  Pt had a good visit and it was helpful to be away from home.   Addressed the stress of caregiving for [name].     Pt gets frustrated with [name] cognitive challenges.  Addressed how this impacts pt.  Pt worries about how [name] will progress.  Worked on Optician, dispensing.  Pt does not have a caregiver today for [name] so it is a more difficult day.  Addressed how pt can structure the day. Worked on self care strategies. Provided supportive therapy.    Interventions: Cognitive Behavioral Therapy and Insight-Oriented  Diagnosis:  F33.1   Plan of Care: Recommend ongoing therapy.   Pt participated in setting treatment goals.   Pt needs a place to talk about stressors.  Plan to meet every week.  Pt  agrees with treatment plan.    Treatment Plan (target date: 03/27/2025) Client Abilities/Strengths  Pt is bright, engaging, and motivated for therapy.   Client Treatment Preferences  Individual therapy.  Client Statement of Needs  Improve coping skills.  Have a place to talk about stressors.   Symptoms  Depressed or irritable mood. Unresolved grief issues.  Problems Addressed  Unipolar Depression Goals 1. Alleviate depressive symptoms and return to previous level of effective functioning. 2. Appropriately grieve the loss in order to normalize mood and to return to previously adaptive level of functioning. Objective Learn and implement behavioral strategies to overcome depression. Target Date: 2025-03-27 Frequency: weekly  Progress: 45 Modality: individual  Related Interventions Engage the client in "behavioral activation," increasing his/her activity level and contact with sources of reward, while identifying processes that inhibit activation.  Use behavioral techniques such as instruction, rehearsal, role-playing, role reversal, as needed, to facilitate activity in the client's daily life; reinforce success. Assist the client in developing skills that increase the likelihood of deriving pleasure from behavioral activation (e.g., assertiveness skills, developing an exercise plan, less internal/more external focus, increased social involvement); reinforce success. Objective Identify important people in life, past and present, and describe the quality, good and poor, of those relationships. Target Date: 2025-03-27 Frequency: weekly  Progress: 45 Modality: individual  Related Interventions Conduct Interpersonal Therapy beginning with the assessment of the client's "interpersonal inventory" of important past and present relationships; develop a case formulation linking depression to grief, interpersonal role disputes, role transitions,  and/or interpersonal deficits). Objective Learn and  implement problem-solving and decision-making skills. Target Date: 2025-03-27 Frequency: weekly  Progress: 45 Modality: individual  Related Interventions Conduct Problem-Solving Therapy using techniques such as psychoeducation, modeling, and role-playing to teach client problem-solving skills (i.e., defining a problem specifically, generating possible solutions, evaluating the pros and cons of each solution, selecting and implementing a plan of action, evaluating the efficacy of the plan, accepting or revising the plan); role-play application of the problem-solving skill to a real life issue. Encourage in the client the development of a positive problem orientation in which problems and solving them are viewed as a natural part of life and not something to be feared, despaired, or avoided. 3. Develop healthy interpersonal relationships that lead to the alleviation and help prevent the relapse of depression. 4. Develop healthy thinking patterns and beliefs about self, others, and the world that lead to the alleviation and help prevent the relapse of depression. 5. Recognize, accept, and cope with feelings of depression. Diagnosis F33.1  Conditions For Discharge Achievement of treatment goals and objectives   Willey Harrier, LCSW

## 2024-05-30 ENCOUNTER — Ambulatory Visit: Attending: Cardiology | Admitting: Cardiology

## 2024-05-30 VITALS — BP 120/70 | HR 71 | Ht 65.0 in | Wt 221.0 lb

## 2024-05-30 DIAGNOSIS — I480 Paroxysmal atrial fibrillation: Secondary | ICD-10-CM

## 2024-05-30 DIAGNOSIS — I5032 Chronic diastolic (congestive) heart failure: Secondary | ICD-10-CM | POA: Diagnosis not present

## 2024-05-30 NOTE — Patient Instructions (Signed)
 Medication Instructions:  Your physician recommends that you continue on your current medications as directed. Please refer to the Current Medication list given to you today.  *If you need a refill on your cardiac medications before your next appointment, please call your pharmacy*  Follow-Up: At [facility], you and your health needs are our priority.  As part of our continuing mission to provide you with exceptional heart care, our providers are all part of one team.  This team includes your primary Cardiologist (physician) and Advanced Practice Providers or APPs (Physician Assistants and Nurse Practitioners) who all work together to provide you with the care you need, when you need it.  Your next appointment:   6 months  Provider:   Sherie Don, NP

## 2024-05-30 NOTE — Therapy (Signed)
 OUTPATIENT PHYSICAL THERAPY TREATMENT      Patient Name: Rachel Torres MRN: [id number] DOB:December 16, 1956, 68 y.o., female Today's Date: 05/31/2024  END OF SESSION:  PT End of Session - 05/31/24 1143     Visit Number 38    Number of Visits 52    Date for PT Re-Evaluation 07/12/24    Authorization Type [employer]  Medicare HMO    Authorization Time Period 01/26/24-03/22/24    Progress Note Due on Visit 10    PT Start Time 1145    PT Stop Time 1229    PT Time Calculation (min) 44 min    Activity Tolerance Patient tolerated treatment well;No increased pain    Behavior During Therapy WFL for tasks assessed/performed                       Past Medical History:  Diagnosis Date   AAA (abdominal aortic aneurysm) (HCC)    a. Chronic w/o evidence of aneurysmal dil on CTA 01/2016.   Alcohol abuse    Allergy    Anemia    Anxiety    Asthma    CAD (coronary artery disease)    a. 08/2010 s/p CABG x 1 (VG->RCA) @ time of Ao dissection repair; b. 01/2016 Lexiscan  MV: mid antsept/apical defect w/ ? peri-infarct ischemia-->likely attenuation-->Med Rx.   Chewing difficulty    Chronic combined systolic (congestive) and diastolic (congestive) heart failure (HCC)    a. 2012 EF 30-35%; b. 04/2015 EF 40-45%; c. 01/2016 Echo: Ef 55-65%, nl AoV bioprosthesis, nl RV, nl PASP.   Constipation    Depression    Edema of both lower extremities    Gallbladder sludge    GERD (gastroesophageal reflux disease)    Heart failure (HCC)    Heart valve problem    History of Bicuspid Aortic Valve    a. 08/2010 s/p AVR @ time of Ao dissection repair; b. 01/2016 Echo: Ef 55-65%, nl AoV bioprosthesis, nl RV, nl PASP.   Hx of repair of dissecting thoracic aortic aneurysm, Stanford type A    a. 08/2010 s/p repair with AVR and VG->RCA; b. 01/2016 CTA: stable appearance of Asc Thoracic Aortic graft. Opacification of flase lumen of chronic abd Ao dissection w/ retrograde flow through lumbar arteries. No aneurysmal dil  of flase lumen.   Hyperlipidemia    Hypertension    Hypertensive heart disease    IBS (irritable bowel syndrome)    Internal hemorrhoids    Kidney problem    Marfan syndrome    pt denies   Migraine    Obstructive sleep apnea    Osteoarthritis    Osteoporosis    Palpitations    Pneumonia    RA (rheumatoid arthritis) (HCC)    a. Followed by Dr. Chaim Colony   Past Surgical History:  Procedure Laterality Date   ABDOMINAL AORTIC ANEURYSM REPAIR     CARDIAC CATHETERIZATION  2001   CARDIOVERSION N/A 04/04/2024   Procedure: CARDIOVERSION;  Surgeon: Devorah Fonder, MD;  Location: [facility] ORS;  Service: Cardiovascular;  Laterality: N/A;   CESAREAN SECTION  1986 & 1991   CHOLECYSTECTOMY     COLONOSCOPY     COLONOSCOPY WITH PROPOFOL  N/A 01/19/2024   Procedure: COLONOSCOPY WITH PROPOFOL ;  Surgeon: Luke Salaam, MD;  Location[facility] ENDOSCOPY;  Service: Gastroenterology;  Laterality: N/A;   ESOPHAGOGASTRODUODENOSCOPY (EGD) WITH PROPOFOL  N/A 05/08/2020   Procedure: ESOPHAGOGASTRODUODENOSCOPY (EGD) WITH PROPOFOL ;  Surgeon: Irby Mannan, MD;  Location: [facility] ENDOSCOPY;  Service: Endoscopy;  Laterality: N/A;   FRACTURE SURGERY Right    shoulder replacement   HEMORRHOID BANDING     ORIF DISTAL RADIUS FRACTURE Left    POLYPECTOMY  01/19/2024   Procedure: POLYPECTOMY;  Surgeon: Luke Salaam, MD;  Location: [facility] ENDOSCOPY;  Service: Gastroenterology;;   UPPER GASTROINTESTINAL ENDOSCOPY     Patient Active Problem List   Diagnosis Date Noted   Typical atrial flutter (HCC) 04/04/2024   Adenomatous polyp of colon 01/19/2024   Heart failure with improved ejection fraction (HFimpEF) (HCC) 01/18/2024   Abnormal metabolism 11/14/2023   Weight regain after inital weight loss 11/14/2023   Multiple closed tarsal fractures of left foot, sequela 07/12/2023   Arm contusion, right, sequela 07/12/2023   Lumbar disc disease 07/12/2023   PAF (paroxysmal atrial fibrillation) (HCC) 06/09/2023   Paresthesia  06/03/2023   Obesity, Beginning BMI 35.45 02/17/2023   Tinnitus 01/14/2023   Change in vision 01/14/2023   Encounter for screening colonoscopy 01/14/2023   Personal history of fall 01/14/2023   Problem related to unspecified psychosocial circumstances 12/28/2022   Person encountering health services to consult on behalf of another person 12/28/2022   Palpitation 12/10/2022   Fatigue 10/26/2022   B12 deficiency 10/26/2022   Right shoulder pain 09/15/2022   Depression 09/14/2022   Other constipation 05/12/2022   Medicare annual wellness visit, initial 07/17/2021   TMJ (dislocation of temporomandibular joint) 07/17/2021   Encounter for screening mammogram for breast cancer 06/05/2021   Diarrhea 04/11/2020   Epigastric pain 02/19/2020   Dark stools 02/19/2020   History of atrial fibrillation 02/05/2019   SOB (shortness of breath) 01/27/2019   Irregular surface of cornea 12/25/2018   Acute cough 09/08/2017   History of HPV infection 12/01/2016   Screening mammogram, encounter for 11/29/2016   Estrogen deficiency 11/29/2016   Encounter for routine gynecological examination 11/29/2016   Grade III hemorrhoids 10/06/2016   Tachycardia 08/14/2016   Hemorrhoids 08/13/2016   Atypical migraine 08/03/2016   CAD (coronary artery disease)    AAA (abdominal aortic aneurysm) (HCC)    Chronic combined systolic (congestive) and diastolic (congestive) heart failure (HCC)    Prediabetes 02/11/2016   Generalized anxiety disorder 12/08/2015   Panic attacks 07/28/2015   Chest pain 07/09/2013   Sensorineural hearing loss 03/27/2013   Vitamin D  deficiency 07/03/2012   H/O aortic dissection 05/12/2012   Routine general medical examination at a health care facility 09/20/2011   Atypical chest pain 09/15/2011   S/P AVR (aortic valve replacement) 01/07/2011   CORONARY ARTERY BYPASS GRAFT, HX OF 01/07/2011   Arthritis 12/20/2010   Back pain 12/15/2010   H/O dissecting abdominal aortic aneurysm repair  08/24/2010   IRRITABLE BOWEL SYNDROME 09/09/2009   Obstructive sleep apnea 08/04/2009   External hemorrhoid 06/26/2009   Osteoporosis 01/09/2009   Hyperlipidemia 07/26/2007   Depression with anxiety 07/26/2007   Essential hypertension 07/26/2007   Allergic rhinitis 07/26/2007   Asthma 07/26/2007   GERD 07/26/2007   Osteoarthritis 07/26/2007   Ocular migraine 07/26/2007   Mild intermittent asthma without complication 07/26/2007    PCP: Clemens Curt MD   REFERRING PROVIDER: Bert Britain  REFERRING DIAG: L rotator cuff   THERAPY DIAG:  Abnormal posture  Other abnormalities of gait and mobility  Other low back pain  Rationale for Evaluation and Treatment: Rehabilitation  ONSET DATE: Feb 2024  SUBJECTIVE:  SUBJECTIVE STATEMENT:   Patient's ablation was cancelled and a conservative treatment will be applied. Patient reports the numbness is bad on bilateral LE after driving.   Hand dominance: Left  PERTINENT HISTORY: Rachel Torres is a 66yoF familiar to our services. Pt referred to PT for strengthening of chronic left shoulder DJD, known remote RC injury. Recently fell and broke four metatarsals, since cleared from use of CAM rocker. She was treated successfully in past for right shoulder pain and has past medical history of Right shoulder surgery replacement. 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 PMH aorta type A dissection in sept 2011 s/p aortic valve replacement, bypass of R coronary artery, anemia, anxiety, CAD, CHF, depression, GERD, Heart failure, HLD, HTN, IBS, migraine, osteoarthritis, osteoporosis, RA, back pain.    Back pain:  Patient has new referral for radiculopathy, lumbar region. MRI showed degeneration of L1-2 and L2-3 with some disc space collapse and minor  retrolisthesis of L2-3. Issue began in February, felt numbness in L thigh. No cause known, but she was at a museum and noticed it was numb. Sometimes L foot numbness and toe numbness of 2nd and 3rd toe causing tripping. This caused her to trip July 4th and break five metatarsals. Some minor pain in the low pain noted. Pain noted to be 2-3/10 Pain in hip 3-4/10 of L hip.    PAIN:  Are you having pain? Yes, 3/10 pain in lateral deltoids in the shoulder.  PRECAUTIONS: None  FALLS:  Has patient fallen in last 6 months? Yes. Number of falls 2  PATIENT GOALS:to lift more than 3lb with LUE, be able to eat a meal without arm being tired.    OBJECTIVE:  DIAGNOSTIC FINDINGS:  IMPRESSION: Mild degenerative change in the cervical spine without acute fracture or traumatic subluxation.   IMPRESSION: Full width, full width tears of the supraspinatus and infraspinatus tendons with retraction to the glenoid. Grade 2/3 infraspinatus atrophy.   Moderate tendinosis of the distal subscapularis tendon with articular sided fraying of the cephalad fibers.   Moderate intra-articular long head biceps tendinosis with fraying and possible partial tearing.   Mild glenohumeral and moderate AC joint osteoarthritis.   Degenerative superior labral tearing anteriorly and posteriorly.   PATIENT SURVEYS:  Quick Dash 50%  and FOTO 46%   COGNITION: Overall cognitive status: Within functional limits for tasks assessed                                  SENSATION: WFL   POSTURE: Slight rounded shoulders and shoulder elevation    UPPER EXTREMITY ROM:    Active ROM Right eval 9/16: recert RUE Left eval 9/16 RECERT LUE  Shoulder flexion 64 122 91 92  Shoulder extension        Shoulder abduction 95 88 52 82  Shoulder adduction        Shoulder internal rotation Unable to test at 90 61 degrees 5 40  Shoulder external rotation 18at side of body -5 degrees at 90 degrees 34 35  Elbow flexion [facility]  WFL    Elbow extension [facility]  WFL   Wrist flexion        Wrist extension        Wrist ulnar deviation        Wrist radial deviation        Wrist pronation        Wrist supination        (  Blank rows = not tested)   UPPER EXTREMITY MMT:   MMT Right eval Left eval  Shoulder flexion      Shoulder extension      Shoulder abduction      Shoulder adduction      Shoulder internal rotation      Shoulder external rotation      Middle trapezius      Lower trapezius      Elbow flexion      Elbow extension      Wrist flexion      Wrist extension      Wrist ulnar deviation      Wrist radial deviation      Wrist pronation      Wrist supination      Grip strength (lbs) 30 35  (Blank rows = not tested) Unable to test strength due to pain with motion   TRUNK ROM: Trunk Flexion 65  Trunk Extension 7  Trunk R SB Limited 25%  Trunk L SB WFL  Trunk R rotation WFL  Trunk L rotation Limited 25%    Ankle ROM Seated: Motion: AROM seated Right Left  Plantarflexion 4+ 3  Dorsiflexion 4+ 4  Inversion 4 3+  Eversion 4+ 3+    SHOULDER SPECIAL TESTS: Impingement tests:  unable to test due to pain in starting position SLAP lesions:  unable to test due to pain in starting position Instability tests:  unable to test due to pain in starting position Rotator cuff assessment:  unable to test due to pain in starting position Biceps assessment:  unable to test due to pain in starting position   LOW BACK SPECIAL TEST: SLR: negative  Scour test: negative FABER: negative Pelvic compression/distraction: negative  Slump test positive LLE  6 min walk test: 1160 ft increased left foot drag and antalgic gait pattern with foot eversion    JOINT MOBILITY TESTING:  AP: painful PA: painful Inferior: painful Distraction: reduces pain   Back mobilization hypomobile and painful to grade II mobilizations    PALPATION:  Tenderness to all joint regions of GH     TODAY'S TREATMENT: DATE: 05/31/24     TE- To improve strength, endurance, mobility, and function of specific targeted muscle groups or improve joint range of motion or improve muscle flexibility  Supine: -sciatic nerve glide 20x each LE  -figure four stretch 60 seconds each LE  -pilates pulse 30 seconds-terminated due to shoulder pain  TrA activation 10x  Tabletop 3x10 second holds  BTB march with TrA 10x each LE ; x2 sets  Mini bridge 12x Childs pose x 30 sec    Manual Distraction in figure four position 3x30 seconds each LE     NMR: To facilitate reeducation of movement, balance, posture, coordination, and/or proprioception/kinesthetic sense.  BAPs board: -df/pf 20x -inversion/eversion 20x -clockwise 20x; counterclockwise 20x  -static hold 60 seconds    Airex pad: -SLS 30 seconds each LE  -March in place 10x each LE; x 2 sets -toe tap to 6 step 10x each LE     PATIENT EDUCATION: Education details: goals, POC  Person educated: Patient Education method: Explanation, Demonstration, Tactile cues, Verbal cues, and Handouts Education comprehension: verbalized understanding, returned demonstration, verbal cues required, and tactile cues required  HOME EXERCISE PROGRAM: Access Code: [email or web address] URL: [email or web address] Date: 05/21/2024 Prepared by: Markel Kurtenbach  Exercises - Hooklying Transversus Abdominis Palpation  - 1 x daily - 7 x weekly - 2 sets - 10 reps - 5 hold -  Supine March  - 1 x daily - 7 x weekly - 2 sets - 10 reps - 5 hold - Seated Ankle Alphabet  - 1 x daily - 7 x weekly - 2 sets - 1 reps - 5 hold - Standing 'L' Stretch at Counter  - 1 x daily - 7 x weekly - 2 sets - 2 reps - 30 hold  Access Code: [email or web address] URL: [email or web address] Date: 10/03/2023 Prepared by: Kalyiah Saintil  Exercises - Seated Scapular Retraction  - 1 x daily - 7 x weekly - 2 sets - 10 reps - 5 hold - Neck Sidebending  - 1 x daily - 7 x weekly - 2 sets - 2 reps - 30 hold - Supine Isometric  Shoulder Extension with Towel  - 1 x daily - 7 x weekly - 2 sets - 10 reps - 5 hold - Supine Isometric Shoulder Adduction with Towel Roll  - 1 x daily - 7 x weekly - 2 sets - 10 reps - 5 hold - Seated Bilateral Shoulder Flexion Towel Slide at Table Top  - 1 x daily - 7 x weekly - 1 sets - 3 reps - 30 hold - Supine Posterior Pelvic Tilt  - 1 x daily - 7 x weekly - 2 sets - 5 reps - 30 hold - Supine LE Neural Mobilization  - 1 x daily - 7 x weekly - 2 sets - 10 reps - 5 hold - Seated Ankle Alphabet  - 1 x daily - 7 x weekly - 2 sets - 1 reps - 5 hold  ASSESSMENT:  CLINICAL IMPRESSION:   Patient continues to have new onset of bilateral neuro symptoms. Her L drop foot is  improving with increased tolerance of BAPs board repetitions. She is highly motivated throughout session and is eager to improver her strength and decrease neurological symptoms. .  Pt will continue to benefit from skilled therapy to address remaining deficits in order to improve overall QoL and return to PLOF.     OBJECTIVE IMPAIRMENTS: cardiopulmonary status limiting activity, decreased activity tolerance, decreased mobility, increased fascial restrictions, impaired perceived functional ability, increased muscle spasms, impaired flexibility, impaired UE functional use, improper body mechanics, postural dysfunction, and pain.   ACTIVITY LIMITATIONS: carrying, lifting, bed mobility, bathing, dressing, self feeding, reach over head, and caring for others  PARTICIPATION LIMITATIONS: meal prep, cleaning, laundry, personal finances, interpersonal relationship, driving, shopping, community activity, yard work, church, and caregiving for husband  PERSONAL FACTORS: Age, Fitness, Past/current experiences, Time since onset of injury/illness/exacerbation, and 3+ comorbidities: aorta type A dissection in sept 2011 with aortic valve replacement bypass of R coronary artery, AAA, anemia, anxiety, CAD, CHF, depression, GERD, Heart failure, HLD,  HTN, IBS, Kidney problems, migraine, osteoarthritis, osteoporosis, RA, ocular migraines, back pain are also affecting patient's functional outcome.   REHAB POTENTIAL: Fair    CLINICAL DECISION MAKING: Evolving/moderate complexity  EVALUATION COMPLEXITY: Moderate   GOALS: Goals reviewed with patient? Yes  SHORT TERM GOALS: Target date: 04/19/2024    Patient will be independent in home exercise program to improve strength/mobility for better functional independence with ADLs. Baseline:7/23: HEP given 9/16: compliant 1x/week 4/3: improving compliance 5/29: compliant Goal status: MET    LONG TERM GOALS: Target date: 07/12/2024      Patient will increase FOTO score to equal to or greater than  57%   to demonstrate statistically significant improvement in mobility and quality of life.  Baseline: 7/22: 46 9/16: 50% 10/7: 40% 12/10: 57% Goal status:  MET  2.  Patient will improve shoulder AROM to > 140 degrees of flexion, scaption, and abduction for improved ability to perform overhead activities. Baseline: 7/22: see above 9/16: see above 10/7 : defer to exacerbation 12/10: shoulder abduction >140, flexion < 140 , assess scaption next visit 2/6: flexion >140, abduction >140 scaption >140 Goal status: MET  3.  Patient will demonstrate adequate shoulder ROM and strength to be able to shave and dress independently with pain less than 3/10. Baseline: 7/23: 6/10 pain with dressing, limited ROM, unable to test strength 9/16: 3/10 pain with dressing  Goal status: MET  4.  Patient will decrease Quick DASH score by > 8 points demonstrating reduced self-reported upper extremity disability for L and R shoulder  Baseline: 7/23: L shoulder 50% 9/16: R=54.54 % L 66% L 81% R 24% 12/10: R: 43.2% L: 56.8% 2/6: L 22%  R 34%  Goal status: MET  5.  Patient will be able to eat an entire meal without resting arm demonstrating improved functional strength Baseline: 7/23: has to rest while eating due to  pain. 9/16: able to make halfway through meal 10/7: can make it about halfway through meal, some meals can get 3/4s through meal 12/10: improved, still has to rest every once in a while 2/6: can get halfway to 2/3rds of the meal. 5/29:  2/3rds of meal  Goal status: deferred at this time to focus on back/leg  6.  Patient will be able to stand 15 minutes without her LLE going numb allowing for ADLS such as showering and iADLs such as cooking Baseline: 5 minutes 12/10: will assess next visit 2/6: 10 minutes 4/3: 10 minutes 5/29: 5-8 minutes Goal status: IN PROGRESS  7. Patient will increase six minute walk test distance to >1500 for progression to community ambulator and improve gait ability Baseline: 1160 ft  12/10: 1010 ft 2/6: 1140 ft  4/3: 1201 ft 5/29: 774 ft stopped with 1 min 35 seconds remaining due to shortness of breath Goal status: IN PROGRESS  8  Patient will reduce modified Oswestry score to <20 as to demonstrate minimal disability with ADLs including improved sleeping tolerance, walking/sitting tolerance etc for better mobility with ADLs.  Baseline: 2/6: 60% 4/3: 50%  Goal status: In progress   9  Patient will improve df strength by 2lb for improved functional ambulation with decreased foot drag  Baseline: 5/29: L 4.2 R 6.9 Goal status: NEW PLAN:  PT FREQUENCY: 2x/week  PT DURATION: 8 weeks  PLANNED INTERVENTIONS: Therapeutic exercises, Therapeutic activity, Neuromuscular re-education, Balance training, Gait training, Patient/Family education, Self Care, Joint mobilization, Vestibular training, Canalith repositioning, Visual/preceptual remediation/compensation, Dry Needling, Electrical stimulation, Spinal mobilization, Cryotherapy, Moist heat, Compression bandaging, scar mobilization, Splintting, Taping, Traction, Ultrasound, Biofeedback, Ionotophoresis 4mg /ml Dexamethasone , Manual therapy, and Re-evaluation  PLAN FOR NEXT SESSION:  Continue with LUE strengthening  L ankle  strengthening, distraction, core activation, reassess scaption and flexion ROM, make HEP    Georgiana Spillane  Sadie Crank ,DPT Physical Therapist- [facility]  [facility]    05/31/24, 12:29 PM

## 2024-05-31 ENCOUNTER — Ambulatory Visit: Payer: Medicare HMO

## 2024-05-31 ENCOUNTER — Other Ambulatory Visit: Payer: Self-pay | Admitting: Cardiology

## 2024-05-31 DIAGNOSIS — M25612 Stiffness of left shoulder, not elsewhere classified: Secondary | ICD-10-CM | POA: Diagnosis not present

## 2024-05-31 DIAGNOSIS — R293 Abnormal posture: Secondary | ICD-10-CM | POA: Diagnosis not present

## 2024-05-31 DIAGNOSIS — R2689 Other abnormalities of gait and mobility: Secondary | ICD-10-CM | POA: Diagnosis not present

## 2024-05-31 DIAGNOSIS — M5459 Other low back pain: Secondary | ICD-10-CM

## 2024-05-31 NOTE — Therapy (Signed)
 OUTPATIENT PHYSICAL THERAPY TREATMENT      Patient Name: Rachel Torres MRN: [id number] DOB:11-24-1956, 68 y.o., female Today's Date: 06/04/2024  END OF SESSION:  PT End of Session - 06/04/24 1126     Visit Number 39    Number of Visits 52    Date for PT Re-Evaluation 07/12/24    Authorization Type [employer]  Medicare HMO    Authorization Time Period 01/26/24-03/22/24    Progress Note Due on Visit 10    PT Start Time 1127    PT Stop Time 1212    PT Time Calculation (min) 45 min    Activity Tolerance Patient tolerated treatment well;No increased pain    Behavior During Therapy WFL for tasks assessed/performed                        Past Medical History:  Diagnosis Date   AAA (abdominal aortic aneurysm) (HCC)    a. Chronic w/o evidence of aneurysmal dil on CTA 01/2016.   Alcohol abuse    Allergy    Anemia    Anxiety    Asthma    CAD (coronary artery disease)    a. 08/2010 s/p CABG x 1 (VG->RCA) @ time of Ao dissection repair; b. 01/2016 Lexiscan  MV: mid antsept/apical defect w/ ? peri-infarct ischemia-->likely attenuation-->Med Rx.   Chewing difficulty    Chronic combined systolic (congestive) and diastolic (congestive) heart failure (HCC)    a. 2012 EF 30-35%; b. 04/2015 EF 40-45%; c. 01/2016 Echo: Ef 55-65%, nl AoV bioprosthesis, nl RV, nl PASP.   Constipation    Depression    Edema of both lower extremities    Gallbladder sludge    GERD (gastroesophageal reflux disease)    Heart failure (HCC)    Heart valve problem    History of Bicuspid Aortic Valve    a. 08/2010 s/p AVR @ time of Ao dissection repair; b. 01/2016 Echo: Ef 55-65%, nl AoV bioprosthesis, nl RV, nl PASP.   Hx of repair of dissecting thoracic aortic aneurysm, Stanford type A    a. 08/2010 s/p repair with AVR and VG->RCA; b. 01/2016 CTA: stable appearance of Asc Thoracic Aortic graft. Opacification of flase lumen of chronic abd Ao dissection w/ retrograde flow through lumbar arteries. No aneurysmal  dil of flase lumen.   Hyperlipidemia    Hypertension    Hypertensive heart disease    IBS (irritable bowel syndrome)    Internal hemorrhoids    Kidney problem    Marfan syndrome    pt denies   Migraine    Obstructive sleep apnea    Osteoarthritis    Osteoporosis    Palpitations    Pneumonia    RA (rheumatoid arthritis) (HCC)    a. Followed by Dr. Chaim Colony   Past Surgical History:  Procedure Laterality Date   ABDOMINAL AORTIC ANEURYSM REPAIR     CARDIAC CATHETERIZATION  2001   CARDIOVERSION N/A 04/04/2024   Procedure: CARDIOVERSION;  Surgeon: Devorah Fonder, MD;  Location: [facility] ORS;  Service: Cardiovascular;  Laterality: N/A;   CESAREAN SECTION  1986 & 1991   CHOLECYSTECTOMY     COLONOSCOPY     COLONOSCOPY WITH PROPOFOL  N/A 01/19/2024   Procedure: COLONOSCOPY WITH PROPOFOL ;  Surgeon: Luke Salaam, MD;  Location: [facility] ENDOSCOPY;  Service: Gastroenterology;  Laterality: N/A;   ESOPHAGOGASTRODUODENOSCOPY (EGD) WITH PROPOFOL  N/A 05/08/2020   Procedure: ESOPHAGOGASTRODUODENOSCOPY (EGD) WITH PROPOFOL ;  Surgeon: Irby Mannan, MD;  Location: [facility] ENDOSCOPY;  Service:  Endoscopy;  Laterality: N/A;   FRACTURE SURGERY Right    shoulder replacement   HEMORRHOID BANDING     ORIF DISTAL RADIUS FRACTURE Left    POLYPECTOMY  01/19/2024   Procedure: POLYPECTOMY;  Surgeon: Luke Salaam, MD;  Location: [facility] ENDOSCOPY;  Service: Gastroenterology;;   UPPER GASTROINTESTINAL ENDOSCOPY     Patient Active Problem List   Diagnosis Date Noted   Typical atrial flutter (HCC) 04/04/2024   Adenomatous polyp of colon 01/19/2024   Heart failure with improved ejection fraction (HFimpEF) (HCC) 01/18/2024   Abnormal metabolism 11/14/2023   Weight regain after inital weight loss 11/14/2023   Multiple closed tarsal fractures of left foot, sequela 07/12/2023   Arm contusion, right, sequela 07/12/2023   Lumbar disc disease 07/12/2023   PAF (paroxysmal atrial fibrillation) (HCC) 06/09/2023   Paresthesia  06/03/2023   Obesity, Beginning BMI 35.45 02/17/2023   Tinnitus 01/14/2023   Change in vision 01/14/2023   Encounter for screening colonoscopy 01/14/2023   Personal history of fall 01/14/2023   Problem related to unspecified psychosocial circumstances 12/28/2022   Person encountering health services to consult on behalf of another person 12/28/2022   Palpitation 12/10/2022   Fatigue 10/26/2022   B12 deficiency 10/26/2022   Right shoulder pain 09/15/2022   Depression 09/14/2022   Other constipation 05/12/2022   Medicare annual wellness visit, initial 07/17/2021   TMJ (dislocation of temporomandibular joint) 07/17/2021   Encounter for screening mammogram for breast cancer 06/05/2021   Diarrhea 04/11/2020   Epigastric pain 02/19/2020   Dark stools 02/19/2020   History of atrial fibrillation 02/05/2019   SOB (shortness of breath) 01/27/2019   Irregular surface of cornea 12/25/2018   Acute cough 09/08/2017   History of HPV infection 12/01/2016   Screening mammogram, encounter for 11/29/2016   Estrogen deficiency 11/29/2016   Encounter for routine gynecological examination 11/29/2016   Grade III hemorrhoids 10/06/2016   Tachycardia 08/14/2016   Hemorrhoids 08/13/2016   Atypical migraine 08/03/2016   CAD (coronary artery disease)    AAA (abdominal aortic aneurysm) (HCC)    Chronic combined systolic (congestive) and diastolic (congestive) heart failure (HCC)    Prediabetes 02/11/2016   Generalized anxiety disorder 12/08/2015   Panic attacks 07/28/2015   Chest pain 07/09/2013   Sensorineural hearing loss 03/27/2013   Vitamin D  deficiency 07/03/2012   H/O aortic dissection 05/12/2012   Routine general medical examination at a health care facility 09/20/2011   Atypical chest pain 09/15/2011   S/P AVR (aortic valve replacement) 01/07/2011   CORONARY ARTERY BYPASS GRAFT, HX OF 01/07/2011   Arthritis 12/20/2010   Back pain 12/15/2010   H/O dissecting abdominal aortic aneurysm repair  08/24/2010   IRRITABLE BOWEL SYNDROME 09/09/2009   Obstructive sleep apnea 08/04/2009   External hemorrhoid 06/26/2009   Osteoporosis 01/09/2009   Hyperlipidemia 07/26/2007   Depression with anxiety 07/26/2007   Essential hypertension 07/26/2007   Allergic rhinitis 07/26/2007   Asthma 07/26/2007   GERD 07/26/2007   Osteoarthritis 07/26/2007   Ocular migraine 07/26/2007   Mild intermittent asthma without complication 07/26/2007    PCP: Clemens Curt MD   REFERRING PROVIDER: Bert Britain  REFERRING DIAG: L rotator cuff   THERAPY DIAG:  Abnormal posture  Other abnormalities of gait and mobility  Other low back pain  Stiffness of left shoulder, not elsewhere classified  Rationale for Evaluation and Treatment: Rehabilitation  ONSET DATE: Feb 2024  SUBJECTIVE:  SUBJECTIVE STATEMENT:   Patient's caregiver duties have continued to be impacted by spouses cognition. Reports her husband thought he was still in the [employer] until after church yesterday.  Has been focused on her posture.   Hand dominance: Left  PERTINENT HISTORY: Kathie Posa is a 66yoF familiar to our services. Pt referred to PT for strengthening of chronic left shoulder DJD, known remote RC injury. Recently fell and broke four metatarsals, since cleared from use of CAM rocker. She was treated successfully in past for right shoulder pain and has past medical history of Right shoulder surgery replacement. 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 PMH aorta type A dissection in sept 2011 s/p aortic valve replacement, bypass of R coronary artery, anemia, anxiety, CAD, CHF, depression, GERD, Heart failure, HLD, HTN, IBS, migraine, osteoarthritis, osteoporosis, RA, back pain.    Back pain:  Patient has new referral for radiculopathy,  lumbar region. MRI showed degeneration of L1-2 and L2-3 with some disc space collapse and minor retrolisthesis of L2-3. Issue began in February, felt numbness in L thigh. No cause known, but she was at a museum and noticed it was numb. Sometimes L foot numbness and toe numbness of 2nd and 3rd toe causing tripping. This caused her to trip July 4th and break five metatarsals. Some minor pain in the low pain noted. Pain noted to be 2-3/10 Pain in hip 3-4/10 of L hip.    PAIN:  Are you having pain? Yes, 3/10 pain in lateral deltoids in the shoulder.  PRECAUTIONS: None  FALLS:  Has patient fallen in last 6 months? Yes. Number of falls 2  PATIENT GOALS:to lift more than 3lb with LUE, be able to eat a meal without arm being tired.    OBJECTIVE:  DIAGNOSTIC FINDINGS:  IMPRESSION: Mild degenerative change in the cervical spine without acute fracture or traumatic subluxation.   IMPRESSION: Full width, full width tears of the supraspinatus and infraspinatus tendons with retraction to the glenoid. Grade 2/3 infraspinatus atrophy.   Moderate tendinosis of the distal subscapularis tendon with articular sided fraying of the cephalad fibers.   Moderate intra-articular long head biceps tendinosis with fraying and possible partial tearing.   Mild glenohumeral and moderate AC joint osteoarthritis.   Degenerative superior labral tearing anteriorly and posteriorly.   PATIENT SURVEYS:  Quick Dash 50%  and FOTO 46%   COGNITION: Overall cognitive status: Within functional limits for tasks assessed                                  SENSATION: WFL   POSTURE: Slight rounded shoulders and shoulder elevation    UPPER EXTREMITY ROM:    Active ROM Right eval 9/16: recert RUE Left eval 9/16 RECERT LUE  Shoulder flexion 64 122 91 92  Shoulder extension        Shoulder abduction 95 88 52 82  Shoulder adduction        Shoulder internal rotation Unable to test at 90 61 degrees 5 40  Shoulder  external rotation 18at side of body -5 degrees at 90 degrees 34 35  Elbow flexion [facility]  WFL   Elbow extension [facility]  WFL   Wrist flexion        Wrist extension        Wrist ulnar deviation        Wrist radial deviation        Wrist pronation  Wrist supination        (Blank rows = not tested)   UPPER EXTREMITY MMT:   MMT Right eval Left eval  Shoulder flexion      Shoulder extension      Shoulder abduction      Shoulder adduction      Shoulder internal rotation      Shoulder external rotation      Middle trapezius      Lower trapezius      Elbow flexion      Elbow extension      Wrist flexion      Wrist extension      Wrist ulnar deviation      Wrist radial deviation      Wrist pronation      Wrist supination      Grip strength (lbs) 30 35  (Blank rows = not tested) Unable to test strength due to pain with motion   TRUNK ROM: Trunk Flexion 65  Trunk Extension 7  Trunk R SB Limited 25%  Trunk L SB WFL  Trunk R rotation WFL  Trunk L rotation Limited 25%    Ankle ROM Seated: Motion: AROM seated Right Left  Plantarflexion 4+ 3  Dorsiflexion 4+ 4  Inversion 4 3+  Eversion 4+ 3+    SHOULDER SPECIAL TESTS: Impingement tests:  unable to test due to pain in starting position SLAP lesions:  unable to test due to pain in starting position Instability tests:  unable to test due to pain in starting position Rotator cuff assessment:  unable to test due to pain in starting position Biceps assessment:  unable to test due to pain in starting position   LOW BACK SPECIAL TEST: SLR: negative  Scour test: negative FABER: negative Pelvic compression/distraction: negative  Slump test positive LLE  6 min walk test: 1160 ft increased left foot drag and antalgic gait pattern with foot eversion    JOINT MOBILITY TESTING:  AP: painful PA: painful Inferior: painful Distraction: reduces pain   Back mobilization hypomobile and painful to grade II mobilizations     PALPATION:  Tenderness to all joint regions of GH     TODAY'S TREATMENT: DATE: 06/04/24    TE- To improve strength, endurance, mobility, and function of specific targeted muscle groups or improve joint range of motion or improve muscle flexibility  Supine: sciatic nerve glide 20x each LE ; leg on SPT shoulder  figure four stretch 60 seconds each LE  TrA activation with green swiss ball and posterior pelvic tilt 10x ; 2 sets Seated large ball swiss rollout x multiple reps in arc of motion    Manual Distraction in figure four position 3x30 seconds each LE     NMR: To facilitate reeducation of movement, balance, posture, coordination, and/or proprioception/kinesthetic sense. Kore Balance machine for weight shift, ankle stabilization, and reaction timing: twisty slopes racing:x2 sets   BAPs board: -df/pf 20x -inversion/eversion 20x -clockwise 20x; counterclockwise 20x  -static hold 60 seconds    Airex pad: -eyes closed 30 seconds -SLS 30 seconds each LE x2 sets  -March in place 10x each LE; x 2 sets; with UE support  -toe tap to 6 step 10x each LE     PATIENT EDUCATION: Education details: goals, POC  Person educated: Patient Education method: Explanation, Demonstration, Tactile cues, Verbal cues, and Handouts Education comprehension: verbalized understanding, returned demonstration, verbal cues required, and tactile cues required  HOME EXERCISE PROGRAM: Access Code: [email or web address] URL: [email or web address] Date: 05/21/2024 Prepared  by: Dufm Gibbon  Exercises - Hooklying Transversus Abdominis Palpation  - 1 x daily - 7 x weekly - 2 sets - 10 reps - 5 hold - Supine March  - 1 x daily - 7 x weekly - 2 sets - 10 reps - 5 hold - Seated Ankle Alphabet  - 1 x daily - 7 x weekly - 2 sets - 1 reps - 5 hold - Standing 'L' Stretch at Counter  - 1 x daily - 7 x weekly - 2 sets - 2 reps - 30 hold  Access Code: [email or web address] URL: [email or web address] Date:  10/03/2023 Prepared by: Jerrald Doverspike  Exercises - Seated Scapular Retraction  - 1 x daily - 7 x weekly - 2 sets - 10 reps - 5 hold - Neck Sidebending  - 1 x daily - 7 x weekly - 2 sets - 2 reps - 30 hold - Supine Isometric Shoulder Extension with Towel  - 1 x daily - 7 x weekly - 2 sets - 10 reps - 5 hold - Supine Isometric Shoulder Adduction with Towel Roll  - 1 x daily - 7 x weekly - 2 sets - 10 reps - 5 hold - Seated Bilateral Shoulder Flexion Towel Slide at Table Top  - 1 x daily - 7 x weekly - 1 sets - 3 reps - 30 hold - Supine Posterior Pelvic Tilt  - 1 x daily - 7 x weekly - 2 sets - 5 reps - 30 hold - Supine LE Neural Mobilization  - 1 x daily - 7 x weekly - 2 sets - 10 reps - 5 hold - Seated Ankle Alphabet  - 1 x daily - 7 x weekly - 2 sets - 1 reps - 5 hold  ASSESSMENT:  CLINICAL IMPRESSION:   Patient has intermittent thoracic pain throughout session.  She is challenged with eyes closed on airex pad close CGA. Weight shifting on KoreBalance improved with repetition. Spatial awareness with marching on airex pad requires cueing due to patient stepping off of pad. Pt will continue to benefit from skilled therapy to address remaining deficits in order to improve overall QoL and return to PLOF.     OBJECTIVE IMPAIRMENTS: cardiopulmonary status limiting activity, decreased activity tolerance, decreased mobility, increased fascial restrictions, impaired perceived functional ability, increased muscle spasms, impaired flexibility, impaired UE functional use, improper body mechanics, postural dysfunction, and pain.   ACTIVITY LIMITATIONS: carrying, lifting, bed mobility, bathing, dressing, self feeding, reach over head, and caring for others  PARTICIPATION LIMITATIONS: meal prep, cleaning, laundry, personal finances, interpersonal relationship, driving, shopping, community activity, yard work, church, and caregiving for husband  PERSONAL FACTORS: Age, Fitness, Past/current experiences, Time  since onset of injury/illness/exacerbation, and 3+ comorbidities: aorta type A dissection in sept 2011 with aortic valve replacement bypass of R coronary artery, AAA, anemia, anxiety, CAD, CHF, depression, GERD, Heart failure, HLD, HTN, IBS, Kidney problems, migraine, osteoarthritis, osteoporosis, RA, ocular migraines, back pain are also affecting patient's functional outcome.   REHAB POTENTIAL: Fair    CLINICAL DECISION MAKING: Evolving/moderate complexity  EVALUATION COMPLEXITY: Moderate   GOALS: Goals reviewed with patient? Yes  SHORT TERM GOALS: Target date: 04/19/2024    Patient will be independent in home exercise program to improve strength/mobility for better functional independence with ADLs. Baseline:7/23: HEP given 9/16: compliant 1x/week 4/3: improving compliance 5/29: compliant Goal status: MET    LONG TERM GOALS: Target date: 07/12/2024      Patient will increase FOTO score to equal to or greater  than  57%   to demonstrate statistically significant improvement in mobility and quality of life.  Baseline: 7/22: 46 9/16: 50% 10/7: 40% 12/10: 57% Goal status: MET  2.  Patient will improve shoulder AROM to > 140 degrees of flexion, scaption, and abduction for improved ability to perform overhead activities. Baseline: 7/22: see above 9/16: see above 10/7 : defer to exacerbation 12/10: shoulder abduction >140, flexion < 140 , assess scaption next visit 2/6: flexion >140, abduction >140 scaption >140 Goal status: MET  3.  Patient will demonstrate adequate shoulder ROM and strength to be able to shave and dress independently with pain less than 3/10. Baseline: 7/23: 6/10 pain with dressing, limited ROM, unable to test strength 9/16: 3/10 pain with dressing  Goal status: MET  4.  Patient will decrease Quick DASH score by > 8 points demonstrating reduced self-reported upper extremity disability for L and R shoulder  Baseline: 7/23: L shoulder 50% 9/16: R=54.54 % L 66% L 81% R  24% 12/10: R: 43.2% L: 56.8% 2/6: L 22%  R 34%  Goal status: MET  5.  Patient will be able to eat an entire meal without resting arm demonstrating improved functional strength Baseline: 7/23: has to rest while eating due to pain. 9/16: able to make halfway through meal 10/7: can make it about halfway through meal, some meals can get 3/4s through meal 12/10: improved, still has to rest every once in a while 2/6: can get halfway to 2/3rds of the meal. 5/29:  2/3rds of meal  Goal status: deferred at this time to focus on back/leg  6.  Patient will be able to stand 15 minutes without her LLE going numb allowing for ADLS such as showering and iADLs such as cooking Baseline: 5 minutes 12/10: will assess next visit 2/6: 10 minutes 4/3: 10 minutes 5/29: 5-8 minutes Goal status: IN PROGRESS  7. Patient will increase six minute walk test distance to >1500 for progression to community ambulator and improve gait ability Baseline: 1160 ft  12/10: 1010 ft 2/6: 1140 ft  4/3: 1201 ft 5/29: 774 ft stopped with 1 min 35 seconds remaining due to shortness of breath Goal status: IN PROGRESS  8  Patient will reduce modified Oswestry score to <20 as to demonstrate minimal disability with ADLs including improved sleeping tolerance, walking/sitting tolerance etc for better mobility with ADLs.  Baseline: 2/6: 60% 4/3: 50%  Goal status: In progress   9  Patient will improve df strength by 2lb for improved functional ambulation with decreased foot drag  Baseline: 5/29: L 4.2 R 6.9 Goal status: NEW PLAN:  PT FREQUENCY: 2x/week  PT DURATION: 8 weeks  PLANNED INTERVENTIONS: Therapeutic exercises, Therapeutic activity, Neuromuscular re-education, Balance training, Gait training, Patient/Family education, Self Care, Joint mobilization, Vestibular training, Canalith repositioning, Visual/preceptual remediation/compensation, Dry Needling, Electrical stimulation, Spinal mobilization, Cryotherapy, Moist heat, Compression  bandaging, scar mobilization, Splintting, Taping, Traction, Ultrasound, Biofeedback, Ionotophoresis 4mg /ml Dexamethasone , Manual therapy, and Re-evaluation  PLAN FOR NEXT SESSION:  Continue with LUE strengthening  L ankle strengthening, distraction, core activation, reassess scaption and flexion ROM, make HEP    Sunjai Levandoski  Sadie Crank ,DPT Physical Therapist- [facility]  [facility]    06/04/24, 12:13 PM

## 2024-06-04 ENCOUNTER — Ambulatory Visit: Payer: Medicare HMO

## 2024-06-04 DIAGNOSIS — M25612 Stiffness of left shoulder, not elsewhere classified: Secondary | ICD-10-CM | POA: Diagnosis not present

## 2024-06-04 DIAGNOSIS — R2689 Other abnormalities of gait and mobility: Secondary | ICD-10-CM | POA: Diagnosis not present

## 2024-06-04 DIAGNOSIS — M5459 Other low back pain: Secondary | ICD-10-CM

## 2024-06-04 DIAGNOSIS — R293 Abnormal posture: Secondary | ICD-10-CM | POA: Diagnosis not present

## 2024-06-05 ENCOUNTER — Encounter: Payer: Self-pay | Admitting: Cardiology

## 2024-06-05 ENCOUNTER — Ambulatory Visit (INDEPENDENT_AMBULATORY_CARE_PROVIDER_SITE_OTHER): Payer: Medicare HMO | Admitting: Psychology

## 2024-06-05 DIAGNOSIS — F331 Major depressive disorder, recurrent, moderate: Secondary | ICD-10-CM

## 2024-06-05 NOTE — Progress Notes (Signed)
 [facility] Counselor/Therapist Progress Note  Patient ID: Rachel Torres, MRN: [id number],    Date: 06/05/2024  Time Spent: 10:00am-10:55am  55 minutes   Treatment Type: Individual Therapy  Reported Symptoms: stress, overwhelm  Mental Status Exam: Appearance:  Casual     Behavior: Appropriate  Motor: Normal  Speech/Language:  Normal Rate  Affect: Appropriate  Mood: normal  Thought process: normal  Thought content:   WNL  Sensory/Perceptual disturbances:   WNL  Orientation: oriented to person, place, time/date, and situation  Attention: Good  Concentration: Good  Memory: WNL  Fund of knowledge:  Good  Insight:   Good  Judgment:  Good  Impulse Control: Good   Risk Assessment: Danger to Self:  No Self-injurious Behavior: No Danger to Others: No Duty to Warn:no Physical Aggression / Violence:No  Access to Firearms a concern: No  Gang Involvement:No   Subjective: Pt present for face-to-face individual therapy via video.  Pt consents to telehealth video session and is aware of limitations and benefits of virtual sessions.   Location of pt: home Location of therapist: home office.   Pt talked about issues with [name] daughter.  His daughter often asks for money and pt has to put boundaries on her.  Addressed how frustrating this is for pt.   Pt talked about her worries about the state of the world and the country.   She is trying to not get too involved in the news.   Helped pt process her worries and feelings.  Addressed the stress of caregiving for [name].    Pt had to take [name] to the ER last week bc there was blood in his urine.   Pt gets frustrated with [name] cognitive challenges.  Addressed how this impacts pt.  Pt worries about how [name] will progress.  Worked on [employer], dispensing.  Worked on self care strategies. Provided supportive therapy.    Interventions: Cognitive Behavioral Therapy and Insight-Oriented  Diagnosis:  F33.1    Plan of Care: Recommend ongoing therapy.   Pt participated in setting treatment goals.   Pt needs a place to talk about stressors.  Plan to meet every week.  Pt agrees with treatment plan.    Treatment Plan (target date: 03/27/2025) Client Abilities/Strengths  Pt is bright, engaging, and motivated for therapy.   Client Treatment Preferences  Individual therapy.  Client Statement of Needs  Improve coping skills.  Have a place to talk about stressors.   Symptoms  Depressed or irritable mood. Unresolved grief issues.  Problems Addressed  Unipolar Depression Goals 1. Alleviate depressive symptoms and return to previous level of effective functioning. 2. Appropriately grieve the loss in order to normalize mood and to return to previously adaptive level of functioning. Objective Learn and implement behavioral strategies to overcome depression. Target Date: 2025-03-27 Frequency: weekly  Progress: 45 Modality: individual  Related Interventions Engage the client in behavioral activation, increasing his/her activity level and contact with sources of reward, while identifying processes that inhibit activation.  Use behavioral techniques such as instruction, rehearsal, role-playing, role reversal, as needed, to facilitate activity in the client's daily life; reinforce success. Assist the client in developing skills that increase the likelihood of deriving pleasure from behavioral activation (e.g., assertiveness skills, developing an exercise plan, less internal/more external focus, increased social involvement); reinforce success. Objective Identify important people in life, past and present, and describe the quality, good and poor, of those relationships. Target Date: 2025-03-27 Frequency: weekly  Progress: 45 Modality: individual  Related Interventions Conduct Interpersonal Therapy beginning with the assessment of the client's interpersonal inventory of important past and present  relationships; develop a case formulation linking depression to grief, interpersonal role disputes, role transitions, and/or interpersonal deficits). Objective Learn and implement problem-solving and decision-making skills. Target Date: 2025-03-27 Frequency: weekly  Progress: 45 Modality: individual  Related Interventions Conduct Problem-Solving Therapy using techniques such as psychoeducation, modeling, and role-playing to teach client problem-solving skills (i.e., defining a problem specifically, generating possible solutions, evaluating the pros and cons of each solution, selecting and implementing a plan of action, evaluating the efficacy of the plan, accepting or revising the plan); role-play application of the problem-solving skill to a real life issue. Encourage in the client the development of a positive problem orientation in which problems and solving them are viewed as a natural part of life and not something to be feared, despaired, or avoided. 3. Develop healthy interpersonal relationships that lead to the alleviation and help prevent the relapse of depression. 4. Develop healthy thinking patterns and beliefs about self, others, and the world that lead to the alleviation and help prevent the relapse of depression. 5. Recognize, accept, and cope with feelings of depression. Diagnosis F33.1  Conditions For Discharge Achievement of treatment goals and objectives   Willey Harrier, LCSW

## 2024-06-07 ENCOUNTER — Ambulatory Visit: Payer: Medicare HMO

## 2024-06-07 DIAGNOSIS — R2689 Other abnormalities of gait and mobility: Secondary | ICD-10-CM | POA: Diagnosis not present

## 2024-06-07 DIAGNOSIS — R293 Abnormal posture: Secondary | ICD-10-CM | POA: Diagnosis not present

## 2024-06-07 DIAGNOSIS — M5459 Other low back pain: Secondary | ICD-10-CM

## 2024-06-07 DIAGNOSIS — M25612 Stiffness of left shoulder, not elsewhere classified: Secondary | ICD-10-CM | POA: Diagnosis not present

## 2024-06-07 NOTE — Therapy (Signed)
 OUTPATIENT PHYSICAL THERAPY TREATMENT/ Physical Therapy Progress Note   Dates of reporting period  04/16/24   to   06/07/24       Patient Name: Rachel Torres MRN: [id number] DOB:1956-01-31, 68 y.o., female Today's Date: 06/07/2024  END OF SESSION:  PT End of Session - 06/07/24 1140     Visit Number 40    Number of Visits 52    Date for PT Re-Evaluation 07/12/24    Authorization Type [employer]  Medicare HMO    Authorization Time Period 01/26/24-03/22/24    Progress Note Due on Visit 10    PT Start Time 1145    PT Stop Time 1228    PT Time Calculation (min) 43 min    Activity Tolerance Patient tolerated treatment well;No increased pain    Behavior During Therapy WFL for tasks assessed/performed                         Past Medical History:  Diagnosis Date   AAA (abdominal aortic aneurysm) (HCC)    a. Chronic w/o evidence of aneurysmal dil on CTA 01/2016.   Alcohol abuse    Allergy    Anemia    Anxiety    Asthma    CAD (coronary artery disease)    a. 08/2010 s/p CABG x 1 (VG->RCA) @ time of Ao dissection repair; b. 01/2016 Lexiscan  MV: mid antsept/apical defect w/ ? peri-infarct ischemia-->likely attenuation-->Med Rx.   Chewing difficulty    Chronic combined systolic (congestive) and diastolic (congestive) heart failure (HCC)    a. 2012 EF 30-35%; b. 04/2015 EF 40-45%; c. 01/2016 Echo: Ef 55-65%, nl AoV bioprosthesis, nl RV, nl PASP.   Constipation    Depression    Edema of both lower extremities    Gallbladder sludge    GERD (gastroesophageal reflux disease)    Heart failure (HCC)    Heart valve problem    History of Bicuspid Aortic Valve    a. 08/2010 s/p AVR @ time of Ao dissection repair; b. 01/2016 Echo: Ef 55-65%, nl AoV bioprosthesis, nl RV, nl PASP.   Hx of repair of dissecting thoracic aortic aneurysm, Stanford type A    a. 08/2010 s/p repair with AVR and VG->RCA; b. 01/2016 CTA: stable appearance of Asc Thoracic Aortic graft. Opacification of flase  lumen of chronic abd Ao dissection w/ retrograde flow through lumbar arteries. No aneurysmal dil of flase lumen.   Hyperlipidemia    Hypertension    Hypertensive heart disease    IBS (irritable bowel syndrome)    Internal hemorrhoids    Kidney problem    Marfan syndrome    pt denies   Migraine    Obstructive sleep apnea    Osteoarthritis    Osteoporosis    Palpitations    Pneumonia    RA (rheumatoid arthritis) (HCC)    a. Followed by Dr. Chaim Colony   Past Surgical History:  Procedure Laterality Date   ABDOMINAL AORTIC ANEURYSM REPAIR     CARDIAC CATHETERIZATION  2001   CARDIOVERSION N/A 04/04/2024   Procedure: CARDIOVERSION;  Surgeon: Devorah Fonder, MD;  Location: [facility] ORS;  Service: Cardiovascular;  Laterality: N/A;   CESAREAN SECTION  1986 & 1991   CHOLECYSTECTOMY     COLONOSCOPY     COLONOSCOPY WITH PROPOFOL  N/A 01/19/2024   Procedure: COLONOSCOPY WITH PROPOFOL ;  Surgeon: Luke Salaam, MD;  Location: [facility] ENDOSCOPY;  Service: Gastroenterology;  Laterality: N/A;   ESOPHAGOGASTRODUODENOSCOPY (EGD) WITH PROPOFOL  N/A  05/08/2020   Procedure: ESOPHAGOGASTRODUODENOSCOPY (EGD) WITH PROPOFOL ;  Surgeon: Irby Mannan, MD;  Location: [facility] ENDOSCOPY;  Service: Endoscopy;  Laterality: N/A;   FRACTURE SURGERY Right    shoulder replacement   HEMORRHOID BANDING     ORIF DISTAL RADIUS FRACTURE Left    POLYPECTOMY  01/19/2024   Procedure: POLYPECTOMY;  Surgeon: Luke Salaam, MD;  Location: [facility] ENDOSCOPY;  Service: Gastroenterology;;   UPPER GASTROINTESTINAL ENDOSCOPY     Patient Active Problem List   Diagnosis Date Noted   Typical atrial flutter (HCC) 04/04/2024   Adenomatous polyp of colon 01/19/2024   Heart failure with improved ejection fraction (HFimpEF) (HCC) 01/18/2024   Abnormal metabolism 11/14/2023   Weight regain after inital weight loss 11/14/2023   Multiple closed tarsal fractures of left foot, sequela 07/12/2023   Arm contusion, right, sequela 07/12/2023   Lumbar disc  disease 07/12/2023   PAF (paroxysmal atrial fibrillation) (HCC) 06/09/2023   Paresthesia 06/03/2023   Obesity, Beginning BMI 35.45 02/17/2023   Tinnitus 01/14/2023   Change in vision 01/14/2023   Encounter for screening colonoscopy 01/14/2023   Personal history of fall 01/14/2023   Problem related to unspecified psychosocial circumstances 12/28/2022   Person encountering health services to consult on behalf of another person 12/28/2022   Palpitation 12/10/2022   Fatigue 10/26/2022   B12 deficiency 10/26/2022   Right shoulder pain 09/15/2022   Depression 09/14/2022   Other constipation 05/12/2022   Medicare annual wellness visit, initial 07/17/2021   TMJ (dislocation of temporomandibular joint) 07/17/2021   Encounter for screening mammogram for breast cancer 06/05/2021   Diarrhea 04/11/2020   Epigastric pain 02/19/2020   Dark stools 02/19/2020   History of atrial fibrillation 02/05/2019   SOB (shortness of breath) 01/27/2019   Irregular surface of cornea 12/25/2018   Acute cough 09/08/2017   History of HPV infection 12/01/2016   Screening mammogram, encounter for 11/29/2016   Estrogen deficiency 11/29/2016   Encounter for routine gynecological examination 11/29/2016   Grade III hemorrhoids 10/06/2016   Tachycardia 08/14/2016   Hemorrhoids 08/13/2016   Atypical migraine 08/03/2016   CAD (coronary artery disease)    AAA (abdominal aortic aneurysm) (HCC)    Chronic combined systolic (congestive) and diastolic (congestive) heart failure (HCC)    Prediabetes 02/11/2016   Generalized anxiety disorder 12/08/2015   Panic attacks 07/28/2015   Chest pain 07/09/2013   Sensorineural hearing loss 03/27/2013   Vitamin D  deficiency 07/03/2012   H/O aortic dissection 05/12/2012   Routine general medical examination at a health care facility 09/20/2011   Atypical chest pain 09/15/2011   S/P AVR (aortic valve replacement) 01/07/2011   CORONARY ARTERY BYPASS GRAFT, HX OF 01/07/2011    Arthritis 12/20/2010   Back pain 12/15/2010   H/O dissecting abdominal aortic aneurysm repair 08/24/2010   IRRITABLE BOWEL SYNDROME 09/09/2009   Obstructive sleep apnea 08/04/2009   External hemorrhoid 06/26/2009   Osteoporosis 01/09/2009   Hyperlipidemia 07/26/2007   Depression with anxiety 07/26/2007   Essential hypertension 07/26/2007   Allergic rhinitis 07/26/2007   Asthma 07/26/2007   GERD 07/26/2007   Osteoarthritis 07/26/2007   Ocular migraine 07/26/2007   Mild intermittent asthma without complication 07/26/2007    PCP: Clemens Curt MD   REFERRING PROVIDER: Bert Britain  REFERRING DIAG: L rotator cuff   THERAPY DIAG:  Abnormal posture  Other abnormalities of gait and mobility  Other low back pain  Rationale for Evaluation and Treatment: Rehabilitation  ONSET DATE: Feb 2024  SUBJECTIVE:  SUBJECTIVE STATEMENT:   Pt reports that she felt good after previous session. Pt states that she is having L knee pain but has not had any cortisone shots  Hand dominance: Left  PERTINENT HISTORY: Rachel Torres is a 66yoF familiar to our services. Pt referred to PT for strengthening of chronic left shoulder DJD, known remote RC injury. Recently fell and broke four metatarsals, since cleared from use of CAM rocker. She was treated successfully in past for right shoulder pain and has past medical history of Right shoulder surgery replacement. 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 PMH aorta type A dissection in sept 2011 s/p aortic valve replacement, bypass of R coronary artery, anemia, anxiety, CAD, CHF, depression, GERD, Heart failure, HLD, HTN, IBS, migraine, osteoarthritis, osteoporosis, RA, back pain.    Back pain:  Patient has new referral for radiculopathy, lumbar region. MRI showed  degeneration of L1-2 and L2-3 with some disc space collapse and minor retrolisthesis of L2-3. Issue began in February, felt numbness in L thigh. No cause known, but she was at a museum and noticed it was numb. Sometimes L foot numbness and toe numbness of 2nd and 3rd toe causing tripping. This caused her to trip July 4th and break five metatarsals. Some minor pain in the low pain noted. Pain noted to be 2-3/10 Pain in hip 3-4/10 of L hip.    PAIN:  Are you having pain? Yes, 3/10 pain in lateral deltoids in the shoulder.  PRECAUTIONS: None  FALLS:  Has patient fallen in last 6 months? Yes. Number of falls 2  PATIENT GOALS:to lift more than 3lb with LUE, be able to eat a meal without arm being tired.    OBJECTIVE:  DIAGNOSTIC FINDINGS:  IMPRESSION: Mild degenerative change in the cervical spine without acute fracture or traumatic subluxation.   IMPRESSION: Full width, full width tears of the supraspinatus and infraspinatus tendons with retraction to the glenoid. Grade 2/3 infraspinatus atrophy.   Moderate tendinosis of the distal subscapularis tendon with articular sided fraying of the cephalad fibers.   Moderate intra-articular long head biceps tendinosis with fraying and possible partial tearing.   Mild glenohumeral and moderate AC joint osteoarthritis.   Degenerative superior labral tearing anteriorly and posteriorly.   PATIENT SURVEYS:  Quick Dash 50%  and FOTO 46%   COGNITION: Overall cognitive status: Within functional limits for tasks assessed                                  SENSATION: WFL   POSTURE: Slight rounded shoulders and shoulder elevation    UPPER EXTREMITY ROM:    Active ROM Right eval 9/16: recert RUE Left eval 9/16 RECERT LUE  Shoulder flexion 64 122 91 92  Shoulder extension        Shoulder abduction 95 88 52 82  Shoulder adduction        Shoulder internal rotation Unable to test at 90 61 degrees 5 40  Shoulder external rotation 18at side  of body -5 degrees at 90 degrees 34 35  Elbow flexion [facility]  WFL   Elbow extension [facility]  WFL   Wrist flexion        Wrist extension        Wrist ulnar deviation        Wrist radial deviation        Wrist pronation        Wrist supination        (  Blank rows = not tested)   UPPER EXTREMITY MMT:   MMT Right eval Left eval  Shoulder flexion      Shoulder extension      Shoulder abduction      Shoulder adduction      Shoulder internal rotation      Shoulder external rotation      Middle trapezius      Lower trapezius      Elbow flexion      Elbow extension      Wrist flexion      Wrist extension      Wrist ulnar deviation      Wrist radial deviation      Wrist pronation      Wrist supination      Grip strength (lbs) 30 35  (Blank rows = not tested) Unable to test strength due to pain with motion   TRUNK ROM: Trunk Flexion 65  Trunk Extension 7  Trunk R SB Limited 25%  Trunk L SB WFL  Trunk R rotation WFL  Trunk L rotation Limited 25%    Ankle ROM Seated: Motion: AROM seated Right Left  Plantarflexion 4+ 3  Dorsiflexion 4+ 4  Inversion 4 3+  Eversion 4+ 3+    SHOULDER SPECIAL TESTS: Impingement tests:  unable to test due to pain in starting position SLAP lesions:  unable to test due to pain in starting position Instability tests:  unable to test due to pain in starting position Rotator cuff assessment:  unable to test due to pain in starting position Biceps assessment:  unable to test due to pain in starting position   LOW BACK SPECIAL TEST: SLR: negative  Scour test: negative FABER: negative Pelvic compression/distraction: negative  Slump test positive LLE  6 min walk test: 1160 ft increased left foot drag and antalgic gait pattern with foot eversion    JOINT MOBILITY TESTING:  AP: painful PA: painful Inferior: painful Distraction: reduces pain   Back mobilization hypomobile and painful to grade II mobilizations    PALPATION:  Tenderness to all  joint regions of GH     TODAY'S TREATMENT: DATE: 06/07/24    TE- To improve strength, endurance, mobility, and function of specific targeted muscle groups or improve joint range of motion or improve muscle flexibility Nustep x4 mins level 1, no arms due to shoulder pain  Supine: sciatic nerve glide 20x each LE ; leg on SPT shoulder  figure four stretch 2x60 seconds each LE  TrA activation with green swiss ball and posterior pelvic tilt 10x ; 2 sets Seated large ball swiss rollout x multiple reps in arc of motion      NMR: To facilitate reeducation of movement, balance, posture, coordination, and/or proprioception/kinesthetic sense. [place] Balance machine for weight shift, ankle stabilization, and reaction timing: twisty slopes racing:x2 sets   BAPs board- shoes off: -df/pf 20x -inversion/eversion 20x -clockwise 20x; counterclockwise 20x- more difficult without shoes on -static hold 60 seconds- increased muscle fatigue visible   Airex pad at support bar: -eyes closed 30 seconds -SLS 30 seconds each LE x2 sets  -March in place 10x each LE; x 2 sets; with UE support  - horizontal head turns x30 seconds - vertical head turns x30 seconds     PATIENT EDUCATION: Education details: goals, POC  Person educated: Patient Education method: Explanation, Demonstration, Tactile cues, Verbal cues, and Handouts Education comprehension: verbalized understanding, returned demonstration, verbal cues required, and tactile cues required  HOME EXERCISE PROGRAM: Access Code:  [email or web address] URL: [email or web address] Date: 05/21/2024 Prepared by: Marina  Moser  Exercises - Hooklying Transversus Abdominis Palpation  - 1 x daily - 7 x weekly - 2 sets - 10 reps - 5 hold - Supine March  - 1 x daily - 7 x weekly - 2 sets - 10 reps - 5 hold - Seated Ankle Alphabet  - 1 x daily - 7 x weekly - 2 sets - 1 reps - 5 hold - Standing 'L' Stretch at Counter  - 1 x daily - 7 x weekly - 2 sets - 2 reps  - 30 hold  Access Code: [email or web address] URL: [email or web address] Date: 10/03/2023 Prepared by: Marina  Moser  Exercises - Seated Scapular Retraction  - 1 x daily - 7 x weekly - 2 sets - 10 reps - 5 hold - Neck Sidebending  - 1 x daily - 7 x weekly - 2 sets - 2 reps - 30 hold - Supine Isometric Shoulder Extension with Towel  - 1 x daily - 7 x weekly - 2 sets - 10 reps - 5 hold - Supine Isometric Shoulder Adduction with Towel Roll  - 1 x daily - 7 x weekly - 2 sets - 10 reps - 5 hold - Seated Bilateral Shoulder Flexion Towel Slide at Table Top  - 1 x daily - 7 x weekly - 1 sets - 3 reps - 30 hold - Supine Posterior Pelvic Tilt  - 1 x daily - 7 x weekly - 2 sets - 5 reps - 30 hold - Supine LE Neural Mobilization  - 1 x daily - 7 x weekly - 2 sets - 10 reps - 5 hold - Seated Ankle Alphabet  - 1 x daily - 7 x weekly - 2 sets - 1 reps - 5 hold  ASSESSMENT:  CLINICAL IMPRESSION:   Patient's condition has the potential to improve in response to therapy. Maximum improvement is yet to be obtained. The anticipated improvement is attainable and reasonable in a generally predictable time. Pt tolerated Nustep and core stabilization exercises well with minimal back spasm reported throughout session. Pt required eyes closed during dynamic balance today due to dizziness, but it did not impact her stability throughout. deferred this session due to pt wearing improper footwear.   Pt will continue to benefit from skilled therapy to address remaining deficits in order to improve overall QoL and return to PLOF.     OBJECTIVE IMPAIRMENTS: cardiopulmonary status limiting activity, decreased activity tolerance, decreased mobility, increased fascial restrictions, impaired perceived functional ability, increased muscle spasms, impaired flexibility, impaired UE functional use, improper body mechanics, postural dysfunction, and pain.   ACTIVITY LIMITATIONS: carrying, lifting, bed mobility, bathing,  dressing, self feeding, reach over head, and caring for others  PARTICIPATION LIMITATIONS: meal prep, cleaning, laundry, personal finances, interpersonal relationship, driving, shopping, community activity, yard work, church, and caregiving for husband  PERSONAL FACTORS: Age, Fitness, Past/current experiences, Time since onset of injury/illness/exacerbation, and 3+ comorbidities: aorta type A dissection in sept 2011 with aortic valve replacement bypass of R coronary artery, AAA, anemia, anxiety, CAD, CHF, depression, GERD, Heart failure, HLD, HTN, IBS, Kidney problems, migraine, osteoarthritis, osteoporosis, RA, ocular migraines, back pain are also affecting patient's functional outcome.   REHAB POTENTIAL: Fair    CLINICAL DECISION MAKING: Evolving/moderate complexity  EVALUATION COMPLEXITY: Moderate   GOALS: Goals reviewed with patient? Yes  SHORT TERM GOALS: Target date: 04/19/2024    Patient will be independent in home exercise program to improve strength/mobility for better functional independence  with ADLs. Baseline:7/23: HEP given 9/16: compliant 1x/week 4/3: improving compliance 5/29: compliant Goal status: MET    LONG TERM GOALS: Target date: 07/12/2024      Patient will increase FOTO score to equal to or greater than  57%   to demonstrate statistically significant improvement in mobility and quality of life.  Baseline: 7/22: 46 9/16: 50% 10/7: 40% 12/10: 57% Goal status: MET  2.  Patient will improve shoulder AROM to > 140 degrees of flexion, scaption, and abduction for improved ability to perform overhead activities. Baseline: 7/22: see above 9/16: see above 10/7 : defer to exacerbation 12/10: shoulder abduction >140, flexion < 140 , assess scaption next visit 2/6: flexion >140, abduction >140 scaption >140 Goal status: MET  3.  Patient will demonstrate adequate shoulder ROM and strength to be able to shave and dress independently with pain less than 3/10. Baseline:  7/23: 6/10 pain with dressing, limited ROM, unable to test strength 9/16: 3/10 pain with dressing  Goal status: MET  4.  Patient will decrease Quick DASH score by > 8 points demonstrating reduced self-reported upper extremity disability for L and R shoulder  Baseline: 7/23: L shoulder 50% 9/16: R=54.54 % L 66% L 81% R 24% 12/10: R: 43.2% L: 56.8% 2/6: L 22%  R 34%  Goal status: MET  5.  Patient will be able to eat an entire meal without resting arm demonstrating improved functional strength Baseline: 7/23: has to rest while eating due to pain. 9/16: able to make halfway through meal 10/7: can make it about halfway through meal, some meals can get 3/4s through meal 12/10: improved, still has to rest every once in a while 2/6: can get halfway to 2/3rds of the meal. 5/29:  2/3rds of meal  Goal status: deferred at this time to focus on back/leg  6.  Patient will be able to stand 15 minutes without her LLE going numb allowing for ADLS such as showering and iADLs such as cooking Baseline: 5 minutes 12/10: will assess next visit 2/6: 10 minutes 4/3: 10 minutes 5/29: 5-8 minutes 6/19: 10 minutes Goal status: IN PROGRESS  7. Patient will increase six minute walk test distance to >1500 for progression to community ambulator and improve gait ability Baseline: 1160 ft  12/10: 1010 ft 2/6: 1140 ft  4/3: 1201 ft 5/29: 774 ft stopped with 1 min 35 seconds remaining due to shortness of breath Goal status: IN PROGRESS  8  Patient will reduce modified Oswestry score to <20 as to demonstrate minimal disability with ADLs including improved sleeping tolerance, walking/sitting tolerance etc for better mobility with ADLs.  Baseline: 2/6: 60% 4/3: 50% 6/19: 46%  Goal status: In progress   9  Patient will improve df strength by 2lb for improved functional ambulation with decreased foot drag  Baseline: 5/29: L 4.2 R 6.9 Goal status: NEW PLAN:  PT FREQUENCY: 2x/week  PT DURATION: 8 weeks  PLANNED INTERVENTIONS:  Therapeutic exercises, Therapeutic activity, Neuromuscular re-education, Balance training, Gait training, Patient/Family education, Self Care, Joint mobilization, Vestibular training, Canalith repositioning, Visual/preceptual remediation/compensation, Dry Needling, Electrical stimulation, Spinal mobilization, Cryotherapy, Moist heat, Compression bandaging, scar mobilization, Splintting, Taping, Traction, Ultrasound, Biofeedback, Ionotophoresis 4mg /ml Dexamethasone , Manual therapy, and Re-evaluation  PLAN FOR NEXT SESSION:  Continue with LUE strengthening  L ankle strengthening, distraction, core activation, reassess scaption and flexion ROM, make HEP   Dontreal Miera, SPT  This entire session was performed under direct supervision and direction of a licensed Estate agent . I have  personally read, edited and approve of the note as written.  Marina  Brain Cahill, PT, DPT Physical Therapist - [facility]  Outpatient Physical Therapy- Main Campus [phone]     Marina  Brain Cahill PT ,DPT Physical Therapist- [facility]    06/07/24, 12:50 PM

## 2024-06-11 ENCOUNTER — Ambulatory Visit: Payer: Medicare HMO

## 2024-06-12 ENCOUNTER — Ambulatory Visit (INDEPENDENT_AMBULATORY_CARE_PROVIDER_SITE_OTHER): Payer: Medicare HMO | Admitting: Psychology

## 2024-06-12 DIAGNOSIS — F331 Major depressive disorder, recurrent, moderate: Secondary | ICD-10-CM | POA: Diagnosis not present
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
## 2024-06-14 ENCOUNTER — Ambulatory Visit: Payer: Medicare HMO

## 2024-06-14 DIAGNOSIS — R293 Abnormal posture: Secondary | ICD-10-CM

## 2024-06-14 DIAGNOSIS — M25612 Stiffness of left shoulder, not elsewhere classified: Secondary | ICD-10-CM | POA: Diagnosis not present

## 2024-06-14 DIAGNOSIS — R2689 Other abnormalities of gait and mobility: Secondary | ICD-10-CM | POA: Diagnosis not present

## 2024-06-14 DIAGNOSIS — M5459 Other low back pain: Secondary | ICD-10-CM | POA: Diagnosis not present

## 2024-06-14 NOTE — Therapy (Signed)
 OUTPATIENT PHYSICAL THERAPY TREATMENT      Patient Name: Rachel Torres MRN: [id number] DOB:07/16/1956, 68 y.o., female Today's Date: 06/14/2024  END OF SESSION:  PT End of Session - 06/14/24 1146     Visit Number 41    Number of Visits 52    Date for PT Re-Evaluation 07/12/24    Authorization Type [employer]  Medicare HMO    Authorization Time Period 01/26/24-03/22/24    Progress Note Due on Visit 10    PT Start Time 1146    PT Stop Time 1227    PT Time Calculation (min) 41 min    Activity Tolerance Patient tolerated treatment well;No increased pain    Behavior During Therapy WFL for tasks assessed/performed                         Past Medical History:  Diagnosis Date   AAA (abdominal aortic aneurysm) (HCC)    a. Chronic w/o evidence of aneurysmal dil on CTA 01/2016.   Alcohol abuse    Allergy    Anemia    Anxiety    Asthma    CAD (coronary artery disease)    a. 08/2010 s/p CABG x 1 (VG->RCA) @ time of Ao dissection repair; b. 01/2016 Lexiscan  MV: mid antsept/apical defect w/ ? peri-infarct ischemia-->likely attenuation-->Med Rx.   Chewing difficulty    Chronic combined systolic (congestive) and diastolic (congestive) heart failure (HCC)    a. 2012 EF 30-35%; b. 04/2015 EF 40-45%; c. 01/2016 Echo: Ef 55-65%, nl AoV bioprosthesis, nl RV, nl PASP.   Constipation    Depression    Edema of both lower extremities    Gallbladder sludge    GERD (gastroesophageal reflux disease)    Heart failure (HCC)    Heart valve problem    History of Bicuspid Aortic Valve    a. 08/2010 s/p AVR @ time of Ao dissection repair; b. 01/2016 Echo: Ef 55-65%, nl AoV bioprosthesis, nl RV, nl PASP.   Hx of repair of dissecting thoracic aortic aneurysm, Stanford type A    a. 08/2010 s/p repair with AVR and VG->RCA; b. 01/2016 CTA: stable appearance of Asc Thoracic Aortic graft. Opacification of flase lumen of chronic abd Ao dissection w/ retrograde flow through lumbar arteries. No aneurysmal  dil of flase lumen.   Hyperlipidemia    Hypertension    Hypertensive heart disease    IBS (irritable bowel syndrome)    Internal hemorrhoids    Kidney problem    Marfan syndrome    pt denies   Migraine    Obstructive sleep apnea    Osteoarthritis    Osteoporosis    Palpitations    Pneumonia    RA (rheumatoid arthritis) (HCC)    a. Followed by Dr. Balinda   Past Surgical History:  Procedure Laterality Date   ABDOMINAL AORTIC ANEURYSM REPAIR     CARDIAC CATHETERIZATION  2001   CARDIOVERSION N/A 04/04/2024   Procedure: CARDIOVERSION;  Surgeon: Perla Evalene PARAS, MD;  Location: [facility] ORS;  Service: Cardiovascular;  Laterality: N/A;   CESAREAN SECTION  1986 & 1991   CHOLECYSTECTOMY     COLONOSCOPY     COLONOSCOPY WITH PROPOFOL  N/A 01/19/2024   Procedure: COLONOSCOPY WITH PROPOFOL ;  Surgeon: Therisa Bi, MD;  Location: [facility] ENDOSCOPY;  Service: Gastroenterology;  Laterality: N/A;   ESOPHAGOGASTRODUODENOSCOPY (EGD) WITH PROPOFOL  N/A 05/08/2020   Procedure: ESOPHAGOGASTRODUODENOSCOPY (EGD) WITH PROPOFOL ;  Surgeon: Janalyn Keene NOVAK, MD;  Location: [facility] ENDOSCOPY;  Service: Endoscopy;  Laterality: N/A;   FRACTURE SURGERY Right    shoulder replacement   HEMORRHOID BANDING     ORIF DISTAL RADIUS FRACTURE Left    POLYPECTOMY  01/19/2024   Procedure: POLYPECTOMY;  Surgeon: Therisa Bi, MD;  Location: [facility] ENDOSCOPY;  Service: Gastroenterology;;   UPPER GASTROINTESTINAL ENDOSCOPY     Patient Active Problem List   Diagnosis Date Noted   Typical atrial flutter (HCC) 04/04/2024   Adenomatous polyp of colon 01/19/2024   Heart failure with improved ejection fraction (HFimpEF) (HCC) 01/18/2024   Abnormal metabolism 11/14/2023   Weight regain after inital weight loss 11/14/2023   Multiple closed tarsal fractures of left foot, sequela 07/12/2023   Arm contusion, right, sequela 07/12/2023   Lumbar disc disease 07/12/2023   PAF (paroxysmal atrial fibrillation) (HCC) 06/09/2023   Paresthesia  06/03/2023   Obesity, Beginning BMI 35.45 02/17/2023   Tinnitus 01/14/2023   Change in vision 01/14/2023   Encounter for screening colonoscopy 01/14/2023   Personal history of fall 01/14/2023   Problem related to unspecified psychosocial circumstances 12/28/2022   Person encountering health services to consult on behalf of another person 12/28/2022   Palpitation 12/10/2022   Fatigue 10/26/2022   B12 deficiency 10/26/2022   Right shoulder pain 09/15/2022   Depression 09/14/2022   Other constipation 05/12/2022   Medicare annual wellness visit, initial 07/17/2021   TMJ (dislocation of temporomandibular joint) 07/17/2021   Encounter for screening mammogram for breast cancer 06/05/2021   Diarrhea 04/11/2020   Epigastric pain 02/19/2020   Dark stools 02/19/2020   History of atrial fibrillation 02/05/2019   SOB (shortness of breath) 01/27/2019   Irregular surface of cornea 12/25/2018   Acute cough 09/08/2017   History of HPV infection 12/01/2016   Screening mammogram, encounter for 11/29/2016   Estrogen deficiency 11/29/2016   Encounter for routine gynecological examination 11/29/2016   Grade III hemorrhoids 10/06/2016   Tachycardia 08/14/2016   Hemorrhoids 08/13/2016   Atypical migraine 08/03/2016   CAD (coronary artery disease)    AAA (abdominal aortic aneurysm) (HCC)    Chronic combined systolic (congestive) and diastolic (congestive) heart failure (HCC)    Prediabetes 02/11/2016   Generalized anxiety disorder 12/08/2015   Panic attacks 07/28/2015   Chest pain 07/09/2013   Sensorineural hearing loss 03/27/2013   Vitamin D  deficiency 07/03/2012   H/O aortic dissection 05/12/2012   Routine general medical examination at a health care facility 09/20/2011   Atypical chest pain 09/15/2011   S/P AVR (aortic valve replacement) 01/07/2011   CORONARY ARTERY BYPASS GRAFT, HX OF 01/07/2011   Arthritis 12/20/2010   Back pain 12/15/2010   H/O dissecting abdominal aortic aneurysm repair  08/24/2010   IRRITABLE BOWEL SYNDROME 09/09/2009   Obstructive sleep apnea 08/04/2009   External hemorrhoid 06/26/2009   Osteoporosis 01/09/2009   Hyperlipidemia 07/26/2007   Depression with anxiety 07/26/2007   Essential hypertension 07/26/2007   Allergic rhinitis 07/26/2007   Asthma 07/26/2007   GERD 07/26/2007   Osteoarthritis 07/26/2007   Ocular migraine 07/26/2007   Mild intermittent asthma without complication 07/26/2007    PCP: Randeen Laine LABOR MD   REFERRING PROVIDER: Lamar Hamming  REFERRING DIAG: L rotator cuff   THERAPY DIAG:  Abnormal posture  Other abnormalities of gait and mobility  Other low back pain  Rationale for Evaluation and Treatment: Rehabilitation  ONSET DATE: Feb 2024  SUBJECTIVE:  SUBJECTIVE STATEMENT:   Pt reports that she had a few back spasms because she was standing a lot this past Saturday. Pt reports that her husband had a bad fall on Sunday.    Hand dominance: Left  PERTINENT HISTORY: Taimi Towe is a 66yoF familiar to our services. Pt referred to PT for strengthening of chronic left shoulder DJD, known remote RC injury. Recently fell and broke four metatarsals, since cleared from use of CAM rocker. She was treated successfully in past for right shoulder pain and has past medical history of Right shoulder surgery replacement. 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 PMH aorta type A dissection in sept 2011 s/p aortic valve replacement, bypass of R coronary artery, anemia, anxiety, CAD, CHF, depression, GERD, Heart failure, HLD, HTN, IBS, migraine, osteoarthritis, osteoporosis, RA, back pain.    Back pain:  Patient has new referral for radiculopathy, lumbar region. MRI showed degeneration of L1-2 and L2-3 with some disc space collapse and minor  retrolisthesis of L2-3. Issue began in February, felt numbness in L thigh. No cause known, but she was at a museum and noticed it was numb. Sometimes L foot numbness and toe numbness of 2nd and 3rd toe causing tripping. This caused her to trip July 4th and break five metatarsals. Some minor pain in the low pain noted. Pain noted to be 2-3/10 Pain in hip 3-4/10 of L hip.    PAIN:  Are you having pain? Yes, 3/10 pain in lateral deltoids in the shoulder.  PRECAUTIONS: None  FALLS:  Has patient fallen in last 6 months? Yes. Number of falls 2  PATIENT GOALS:to lift more than 3lb with LUE, be able to eat a meal without arm being tired.    OBJECTIVE:  DIAGNOSTIC FINDINGS:  IMPRESSION: Mild degenerative change in the cervical spine without acute fracture or traumatic subluxation.   IMPRESSION: Full width, full width tears of the supraspinatus and infraspinatus tendons with retraction to the glenoid. Grade 2/3 infraspinatus atrophy.   Moderate tendinosis of the distal subscapularis tendon with articular sided fraying of the cephalad fibers.   Moderate intra-articular long head biceps tendinosis with fraying and possible partial tearing.   Mild glenohumeral and moderate AC joint osteoarthritis.   Degenerative superior labral tearing anteriorly and posteriorly.   PATIENT SURVEYS:  Quick Dash 50%  and FOTO 46%   COGNITION: Overall cognitive status: Within functional limits for tasks assessed                                  SENSATION: WFL   POSTURE: Slight rounded shoulders and shoulder elevation    UPPER EXTREMITY ROM:    Active ROM Right eval 9/16: recert RUE Left eval 9/16 RECERT LUE  Shoulder flexion 64 122 91 92  Shoulder extension        Shoulder abduction 95 88 52 82  Shoulder adduction        Shoulder internal rotation Unable to test at 90 61 degrees 5 40  Shoulder external rotation 18at side of body -5 degrees at 90 degrees 34 35  Elbow flexion [facility]  WFL    Elbow extension [facility]  WFL   Wrist flexion        Wrist extension        Wrist ulnar deviation        Wrist radial deviation        Wrist pronation  Wrist supination        (Blank rows = not tested)   UPPER EXTREMITY MMT:   MMT Right eval Left eval  Shoulder flexion      Shoulder extension      Shoulder abduction      Shoulder adduction      Shoulder internal rotation      Shoulder external rotation      Middle trapezius      Lower trapezius      Elbow flexion      Elbow extension      Wrist flexion      Wrist extension      Wrist ulnar deviation      Wrist radial deviation      Wrist pronation      Wrist supination      Grip strength (lbs) 30 35  (Blank rows = not tested) Unable to test strength due to pain with motion   TRUNK ROM: Trunk Flexion 65  Trunk Extension 7  Trunk R SB Limited 25%  Trunk L SB WFL  Trunk R rotation WFL  Trunk L rotation Limited 25%    Ankle ROM Seated: Motion: AROM seated Right Left  Plantarflexion 4+ 3  Dorsiflexion 4+ 4  Inversion 4 3+  Eversion 4+ 3+    SHOULDER SPECIAL TESTS: Impingement tests:  unable to test due to pain in starting position SLAP lesions:  unable to test due to pain in starting position Instability tests:  unable to test due to pain in starting position Rotator cuff assessment:  unable to test due to pain in starting position Biceps assessment:  unable to test due to pain in starting position   LOW BACK SPECIAL TEST: SLR: negative  Scour test: negative FABER: negative Pelvic compression/distraction: negative  Slump test positive LLE  6 min walk test: 1160 ft increased left foot drag and antalgic gait pattern with foot eversion    JOINT MOBILITY TESTING:  AP: painful PA: painful Inferior: painful Distraction: reduces pain   Back mobilization hypomobile and painful to grade II mobilizations    PALPATION:  Tenderness to all joint regions of GH     TODAY'S TREATMENT: DATE: 06/14/24     Physical Performance Measures: 6 Min Walk Test:  Instructed patient to ambulate as quickly and as safely as possible for 6 minutes using LRAD. Patient was allowed to take standing rest breaks without stopping the test, but if the patient required a sitting rest break the clock would be stopped and the test would be over.  Results: 905 feet (275.8 meters) using no AD with CGA. Results indicate that the patient has reduced endurance with ambulation compared to age matched norms.  Age Matched Norms: 75-69 yo M: 526m F: 532m, 50-79 yo M: 44 F: 471, 8-89 yo M: 417 F: 392 MDC: 58.21 meters (190.98 feet) or 50 meters (ANPTA Core Set of Outcome Measures for Adults with Neurologic Conditions, 2018) Vitals assessed throughout due to pt report of SOB, 94 SpO2 94 HR at end of test 1 seated rest break during test at 1:40 seconds remaining, pt able to continue testing with 1 minute remaining  TE- To improve strength, endurance, mobility, and function of specific targeted muscle groups or improve joint range of motion or improve muscle flexibility  Supine: sciatic nerve glide 20x each LE ; leg on SPT shoulder  figure four stretch 60 seconds each LE  Sidelying thoracic rotations x10 each side LE rotations x10 each side Seated swiss  ball roll outs x10      NMR: To facilitate reeducation of movement, balance, posture, coordination, and/or proprioception/kinesthetic sense. Kore Balance machine for weight shift, ankle stabilization, and reaction timing: twisty slopes racing:x2 sets     PATIENT EDUCATION: Education details: goals, POC  Person educated: Patient Education method: Explanation, Demonstration, Tactile cues, Verbal cues, and Handouts Education comprehension: verbalized understanding, returned demonstration, verbal cues required, and tactile cues required  HOME EXERCISE PROGRAM: Access Code: [email or web address] URL: [email or web address] Date: 05/21/2024 Prepared by: Marina   Moser  Exercises - Hooklying Transversus Abdominis Palpation  - 1 x daily - 7 x weekly - 2 sets - 10 reps - 5 hold - Supine March  - 1 x daily - 7 x weekly - 2 sets - 10 reps - 5 hold - Seated Ankle Alphabet  - 1 x daily - 7 x weekly - 2 sets - 1 reps - 5 hold - Standing 'L' Stretch at Counter  - 1 x daily - 7 x weekly - 2 sets - 2 reps - 30 hold  Access Code: [email or web address] URL: [email or web address] Date: 10/03/2023 Prepared by: Marina  Moser  Exercises - Seated Scapular Retraction  - 1 x daily - 7 x weekly - 2 sets - 10 reps - 5 hold - Neck Sidebending  - 1 x daily - 7 x weekly - 2 sets - 2 reps - 30 hold - Supine Isometric Shoulder Extension with Towel  - 1 x daily - 7 x weekly - 2 sets - 10 reps - 5 hold - Supine Isometric Shoulder Adduction with Towel Roll  - 1 x daily - 7 x weekly - 2 sets - 10 reps - 5 hold - Seated Bilateral Shoulder Flexion Towel Slide at Table Top  - 1 x daily - 7 x weekly - 1 sets - 3 reps - 30 hold - Supine Posterior Pelvic Tilt  - 1 x daily - 7 x weekly - 2 sets - 5 reps - 30 hold - Supine LE Neural Mobilization  - 1 x daily - 7 x weekly - 2 sets - 10 reps - 5 hold - Seated Ankle Alphabet  - 1 x daily - 7 x weekly - 2 sets - 1 reps - 5 hold  ASSESSMENT:  CLINICAL IMPRESSION:   [name] distance improved by 131 feet from previous testing but still remains lower than baseline performance. [name] SpO2 remained within acceptable limits despite pt reports of SOB throughout . Pt reported an increase in back spasms after the , but with extensive stretching pt reported a notable decrease in spasms at end of session. Pt will continue to benefit from skilled therapy to address remaining deficits in order to improve overall QoL and return to PLOF.     OBJECTIVE IMPAIRMENTS: cardiopulmonary status limiting activity, decreased activity tolerance, decreased mobility, increased fascial restrictions, impaired perceived functional ability, increased muscle  spasms, impaired flexibility, impaired UE functional use, improper body mechanics, postural dysfunction, and pain.   ACTIVITY LIMITATIONS: carrying, lifting, bed mobility, bathing, dressing, self feeding, reach over head, and caring for others  PARTICIPATION LIMITATIONS: meal prep, cleaning, laundry, personal finances, interpersonal relationship, driving, shopping, community activity, yard work, church, and caregiving for husband  PERSONAL FACTORS: Age, Fitness, Past/current experiences, Time since onset of injury/illness/exacerbation, and 3+ comorbidities: aorta type A dissection in sept 2011 with aortic valve replacement bypass of R coronary artery, AAA, anemia, anxiety, CAD, CHF, depression, GERD, Heart failure, HLD, HTN, IBS, Kidney problems, migraine, osteoarthritis, osteoporosis, RA, ocular migraines,  back pain are also affecting patient's functional outcome.   REHAB POTENTIAL: Fair    CLINICAL DECISION MAKING: Evolving/moderate complexity  EVALUATION COMPLEXITY: Moderate   GOALS: Goals reviewed with patient? Yes  SHORT TERM GOALS: Target date: 04/19/2024    Patient will be independent in home exercise program to improve strength/mobility for better functional independence with ADLs. Baseline:7/23: HEP given 9/16: compliant 1x/week 4/3: improving compliance 5/29: compliant Goal status: MET    LONG TERM GOALS: Target date: 07/12/2024      Patient will increase FOTO score to equal to or greater than  57%   to demonstrate statistically significant improvement in mobility and quality of life.  Baseline: 7/22: 46 9/16: 50% 10/7: 40% 12/10: 57% Goal status: MET  2.  Patient will improve shoulder AROM to > 140 degrees of flexion, scaption, and abduction for improved ability to perform overhead activities. Baseline: 7/22: see above 9/16: see above 10/7 : defer to exacerbation 12/10: shoulder abduction >140, flexion < 140 , assess scaption next visit 2/6: flexion >140, abduction >140  scaption >140 Goal status: MET  3.  Patient will demonstrate adequate shoulder ROM and strength to be able to shave and dress independently with pain less than 3/10. Baseline: 7/23: 6/10 pain with dressing, limited ROM, unable to test strength 9/16: 3/10 pain with dressing  Goal status: MET  4.  Patient will decrease Quick DASH score by > 8 points demonstrating reduced self-reported upper extremity disability for L and R shoulder  Baseline: 7/23: L shoulder 50% 9/16: R=54.54 % L 66% L 81% R 24% 12/10: R: 43.2% L: 56.8% 2/6: L 22%  R 34%  Goal status: MET  5.  Patient will be able to eat an entire meal without resting arm demonstrating improved functional strength Baseline: 7/23: has to rest while eating due to pain. 9/16: able to make halfway through meal 10/7: can make it about halfway through meal, some meals can get 3/4s through meal 12/10: improved, still has to rest every once in a while 2/6: can get halfway to 2/3rds of the meal. 5/29:  2/3rds of meal  Goal status: deferred at this time to focus on back/leg  6.  Patient will be able to stand 15 minutes without her LLE going numb allowing for ADLS such as showering and iADLs such as cooking Baseline: 5 minutes 12/10: will assess next visit 2/6: 10 minutes 4/3: 10 minutes 5/29: 5-8 minutes Goal status: IN PROGRESS  7. Patient will increase six minute walk test distance to >1500 for progression to community ambulator and improve gait ability Baseline: 1160 ft  12/10: 1010 ft 2/6: 1140 ft  4/3: 1201 ft 5/29: 774 ft stopped with 1 min 35 seconds remaining due to shortness of breath 6/26: 994ft with one seated rest break at 1:40 seconds remaining due to SOB Goal status: IN PROGRESS  8  Patient will reduce modified Oswestry score to <20 as to demonstrate minimal disability with ADLs including improved sleeping tolerance, walking/sitting tolerance etc for better mobility with ADLs.  Baseline: 2/6: 60% 4/3: 50%  Goal status: In progress   9   Patient will improve df strength by 2lb for improved functional ambulation with decreased foot drag  Baseline: 5/29: L 4.2 R 6.9 Goal status: NEW PLAN:  PT FREQUENCY: 2x/week  PT DURATION: 8 weeks  PLANNED INTERVENTIONS: Therapeutic exercises, Therapeutic activity, Neuromuscular re-education, Balance training, Gait training, Patient/Family education, Self Care, Joint mobilization, Vestibular training, Canalith repositioning, Visual/preceptual remediation/compensation, Dry Needling, Electrical stimulation, Spinal mobilization,  Cryotherapy, Moist heat, Compression bandaging, scar mobilization, Splintting, Taping, Traction, Ultrasound, Biofeedback, Ionotophoresis 4mg /ml Dexamethasone , Manual therapy, and Re-evaluation  PLAN FOR NEXT SESSION:  Continue with LUE strengthening  L ankle strengthening, distraction, core activation, reassess scaption and flexion ROM, make HEP   Janell Axe, SPT  This entire session was performed under direct supervision and direction of a licensed Estate agent . I have personally read, edited and approve of the note as written.  Marina  Leopoldo KLEIN ,DPT Physical Therapist- [facility]    06/14/24, 2:50 PM

## 2024-06-19 ENCOUNTER — Ambulatory Visit: Payer: Medicare HMO | Admitting: Psychology

## 2024-06-20 ENCOUNTER — Other Ambulatory Visit: Payer: Self-pay | Admitting: Cardiology

## 2024-06-21 ENCOUNTER — Ambulatory Visit: Payer: Medicare HMO

## 2024-06-21 ENCOUNTER — Other Ambulatory Visit: Payer: Self-pay | Admitting: Cardiovascular Disease

## 2024-06-25 ENCOUNTER — Ambulatory Visit: Payer: Medicare HMO | Attending: Orthopedic Surgery

## 2024-06-25 ENCOUNTER — Telehealth: Payer: Self-pay

## 2024-06-25 DIAGNOSIS — M5459 Other low back pain: Secondary | ICD-10-CM | POA: Insufficient documentation

## 2024-06-25 DIAGNOSIS — R278 Other lack of coordination: Secondary | ICD-10-CM | POA: Insufficient documentation

## 2024-06-25 DIAGNOSIS — M6281 Muscle weakness (generalized): Secondary | ICD-10-CM | POA: Insufficient documentation

## 2024-06-25 DIAGNOSIS — R2689 Other abnormalities of gait and mobility: Secondary | ICD-10-CM | POA: Insufficient documentation

## 2024-06-25 DIAGNOSIS — M25612 Stiffness of left shoulder, not elsewhere classified: Secondary | ICD-10-CM | POA: Insufficient documentation

## 2024-06-25 DIAGNOSIS — R293 Abnormal posture: Secondary | ICD-10-CM | POA: Insufficient documentation

## 2024-06-25 NOTE — Therapy (Incomplete)
 OUTPATIENT PHYSICAL THERAPY TREATMENT      Patient Name: Rachel Torres MRN: [id number] DOB:05-15-1956, 68 y.o., female Today's Date: 06/25/2024  END OF SESSION:                   Past Medical History:  Diagnosis Date   AAA (abdominal aortic aneurysm) (HCC)    a. Chronic w/o evidence of aneurysmal dil on CTA 01/2016.   Alcohol abuse    Allergy    Anemia    Anxiety    Asthma    CAD (coronary artery disease)    a. 08/2010 s/p CABG x 1 (VG->RCA) @ time of Ao dissection repair; b. 01/2016 Lexiscan  MV: mid antsept/apical defect w/ ? peri-infarct ischemia-->likely attenuation-->Med Rx.   Chewing difficulty    Chronic combined systolic (congestive) and diastolic (congestive) heart failure (HCC)    a. 2012 EF 30-35%; b. 04/2015 EF 40-45%; c. 01/2016 Echo: Ef 55-65%, nl AoV bioprosthesis, nl RV, nl PASP.   Constipation    Depression    Edema of both lower extremities    Gallbladder sludge    GERD (gastroesophageal reflux disease)    Heart failure (HCC)    Heart valve problem    History of Bicuspid Aortic Valve    a. 08/2010 s/p AVR @ time of Ao dissection repair; b. 01/2016 Echo: Ef 55-65%, nl AoV bioprosthesis, nl RV, nl PASP.   Hx of repair of dissecting thoracic aortic aneurysm, Stanford type A    a. 08/2010 s/p repair with AVR and VG->RCA; b. 01/2016 CTA: stable appearance of Asc Thoracic Aortic graft. Opacification of flase lumen of chronic abd Ao dissection w/ retrograde flow through lumbar arteries. No aneurysmal dil of flase lumen.   Hyperlipidemia    Hypertension    Hypertensive heart disease    IBS (irritable bowel syndrome)    Internal hemorrhoids    Kidney problem    Marfan syndrome    pt denies   Migraine    Obstructive sleep apnea    Osteoarthritis    Osteoporosis    Palpitations    Pneumonia    RA (rheumatoid arthritis) (HCC)    a. Followed by Dr. Balinda   Past Surgical History:  Procedure Laterality Date   ABDOMINAL AORTIC ANEURYSM REPAIR      CARDIAC CATHETERIZATION  2001   CARDIOVERSION N/A 04/04/2024   Procedure: CARDIOVERSION;  Surgeon: Perla Evalene PARAS, MD;  Location: [facility] ORS;  Service: Cardiovascular;  Laterality: N/A;   CESAREAN SECTION  1986 & 1991   CHOLECYSTECTOMY     COLONOSCOPY     COLONOSCOPY WITH PROPOFOL  N/A 01/19/2024   Procedure: COLONOSCOPY WITH PROPOFOL ;  Surgeon: Therisa Bi, MD;  Location: [facility] ENDOSCOPY;  Service: Gastroenterology;  Laterality: N/A;   ESOPHAGOGASTRODUODENOSCOPY (EGD) WITH PROPOFOL  N/A 05/08/2020   Procedure: ESOPHAGOGASTRODUODENOSCOPY (EGD) WITH PROPOFOL ;  Surgeon: Janalyn Keene NOVAK, MD;  Location: [facility] ENDOSCOPY;  Service: Endoscopy;  Laterality: N/A;   FRACTURE SURGERY Right    shoulder replacement   HEMORRHOID BANDING     ORIF DISTAL RADIUS FRACTURE Left    POLYPECTOMY  01/19/2024   Procedure: POLYPECTOMY;  Surgeon: Therisa Bi, MD;  Location: [facility]OSCOPY;  Service: Gastroenterology;;   UPPER GASTROINTESTINAL ENDOSCOPY     Patient Active Problem List   Diagnosis Date Noted   Typical atrial flutter (HCC) 04/04/2024   Adenomatous polyp of colon 01/19/2024   Heart failure with improved ejection fraction (HFimpEF) (HCC) 01/18/2024   Abnormal metabolism 11/14/2023   Weight regain after inital weight  loss 11/14/2023   Multiple closed tarsal fractures of left foot, sequela 07/12/2023   Arm contusion, right, sequela 07/12/2023   Lumbar disc disease 07/12/2023   PAF (paroxysmal atrial fibrillation) (HCC) 06/09/2023   Paresthesia 06/03/2023   Obesity, Beginning BMI 35.45 02/17/2023   Tinnitus 01/14/2023   Change in vision 01/14/2023   Encounter for screening colonoscopy 01/14/2023   Personal history of fall 01/14/2023   Problem related to unspecified psychosocial circumstances 12/28/2022   Person encountering health services to consult on behalf of another person 12/28/2022   Palpitation 12/10/2022   Fatigue 10/26/2022   B12 deficiency 10/26/2022   Right shoulder pain 09/15/2022    Depression 09/14/2022   Other constipation 05/12/2022   Medicare annual wellness visit, initial 07/17/2021   TMJ (dislocation of temporomandibular joint) 07/17/2021   Encounter for screening mammogram for breast cancer 06/05/2021   Diarrhea 04/11/2020   Epigastric pain 02/19/2020   Dark stools 02/19/2020   History of atrial fibrillation 02/05/2019   SOB (shortness of breath) 01/27/2019   Irregular surface of cornea 12/25/2018   Acute cough 09/08/2017   History of HPV infection 12/01/2016   Screening mammogram, encounter for 11/29/2016   Estrogen deficiency 11/29/2016   Encounter for routine gynecological examination 11/29/2016   Grade III hemorrhoids 10/06/2016   Tachycardia 08/14/2016   Hemorrhoids 08/13/2016   Atypical migraine 08/03/2016   CAD (coronary artery disease)    AAA (abdominal aortic aneurysm) (HCC)    Chronic combined systolic (congestive) and diastolic (congestive) heart failure (HCC)    Prediabetes 02/11/2016   Generalized anxiety disorder 12/08/2015   Panic attacks 07/28/2015   Chest pain 07/09/2013   Sensorineural hearing loss 03/27/2013   Vitamin D  deficiency 07/03/2012   H/O aortic dissection 05/12/2012   Routine general medical examination at a health care facility 09/20/2011   Atypical chest pain 09/15/2011   S/P AVR (aortic valve replacement) 01/07/2011   CORONARY ARTERY BYPASS GRAFT, HX OF 01/07/2011   Arthritis 12/20/2010   Back pain 12/15/2010   H/O dissecting abdominal aortic aneurysm repair 08/24/2010   IRRITABLE BOWEL SYNDROME 09/09/2009   Obstructive sleep apnea 08/04/2009   External hemorrhoid 06/26/2009   Osteoporosis 01/09/2009   Hyperlipidemia 07/26/2007   Depression with anxiety 07/26/2007   Essential hypertension 07/26/2007   Allergic rhinitis 07/26/2007   Asthma 07/26/2007   GERD 07/26/2007   Osteoarthritis 07/26/2007   Ocular migraine 07/26/2007   Mild intermittent asthma without complication 07/26/2007    PCP: Randeen Laine LABOR MD   REFERRING PROVIDER: Lamar Hamming  REFERRING DIAG: L rotator cuff   THERAPY DIAG:  No diagnosis found.  Rationale for Evaluation and Treatment: Rehabilitation  ONSET DATE: Feb 2024  SUBJECTIVE:  SUBJECTIVE STATEMENT:    ***   Hand dominance: Left  PERTINENT HISTORY: Masae Lukacs is a 66yoF familiar to our services. Pt referred to PT for strengthening of chronic left shoulder DJD, known remote RC injury. Recently fell and broke four metatarsals, since cleared from use of CAM rocker. She was treated successfully in past for right shoulder pain and has past medical history of Right shoulder surgery replacement. 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 PMH aorta type A dissection in sept 2011 s/p aortic valve replacement, bypass of R coronary artery, anemia, anxiety, CAD, CHF, depression, GERD, Heart failure, HLD, HTN, IBS, migraine, osteoarthritis, osteoporosis, RA, back pain.    Back pain:  Patient has new referral for radiculopathy, lumbar region. MRI showed degeneration of L1-2 and L2-3 with some disc space collapse and minor retrolisthesis of L2-3. Issue began in February, felt numbness in L thigh. No cause known, but she was at a museum and noticed it was numb. Sometimes L foot numbness and toe numbness of 2nd and 3rd toe causing tripping. This caused her to trip July 4th and break five metatarsals. Some minor pain in the low pain noted. Pain noted to be 2-3/10 Pain in hip 3-4/10 of L hip.    PAIN:  Are you having pain? Yes, 3/10 pain in lateral deltoids in the shoulder.  PRECAUTIONS: None  FALLS:  Has patient fallen in last 6 months? Yes. Number of falls 2  PATIENT GOALS:to lift more than 3lb with LUE, be able to eat a meal without arm being tired.    OBJECTIVE:  DIAGNOSTIC  FINDINGS:  IMPRESSION: Mild degenerative change in the cervical spine without acute fracture or traumatic subluxation.   IMPRESSION: Full width, full width tears of the supraspinatus and infraspinatus tendons with retraction to the glenoid. Grade 2/3 infraspinatus atrophy.   Moderate tendinosis of the distal subscapularis tendon with articular sided fraying of the cephalad fibers.   Moderate intra-articular long head biceps tendinosis with fraying and possible partial tearing.   Mild glenohumeral and moderate AC joint osteoarthritis.   Degenerative superior labral tearing anteriorly and posteriorly.   PATIENT SURVEYS:  Quick Dash 50%  and FOTO 46%   COGNITION: Overall cognitive status: Within functional limits for tasks assessed                                  SENSATION: WFL   POSTURE: Slight rounded shoulders and shoulder elevation    UPPER EXTREMITY ROM:    Active ROM Right eval 9/16: recert RUE Left eval 9/16 RECERT LUE  Shoulder flexion 64 122 91 92  Shoulder extension        Shoulder abduction 95 88 52 82  Shoulder adduction        Shoulder internal rotation Unable to test at 90 61 degrees 5 40  Shoulder external rotation 18at side of body -5 degrees at 90 degrees 34 35  Elbow flexion [facility]  WFL   Elbow extension [facility]  WFL   Wrist flexion        Wrist extension        Wrist ulnar deviation        Wrist radial deviation        Wrist pronation        Wrist supination        (Blank rows = not tested)   UPPER EXTREMITY MMT:   MMT Right eval Left eval  Shoulder  flexion      Shoulder extension      Shoulder abduction      Shoulder adduction      Shoulder internal rotation      Shoulder external rotation      Middle trapezius      Lower trapezius      Elbow flexion      Elbow extension      Wrist flexion      Wrist extension      Wrist ulnar deviation      Wrist radial deviation      Wrist pronation      Wrist supination      Grip strength (lbs)  30 35  (Blank rows = not tested) Unable to test strength due to pain with motion   TRUNK ROM: Trunk Flexion 65  Trunk Extension 7  Trunk R SB Limited 25%  Trunk L SB WFL  Trunk R rotation WFL  Trunk L rotation Limited 25%    Ankle ROM Seated: Motion: AROM seated Right Left  Plantarflexion 4+ 3  Dorsiflexion 4+ 4  Inversion 4 3+  Eversion 4+ 3+    SHOULDER SPECIAL TESTS: Impingement tests:  unable to test due to pain in starting position SLAP lesions:  unable to test due to pain in starting position Instability tests:  unable to test due to pain in starting position Rotator cuff assessment:  unable to test due to pain in starting position Biceps assessment:  unable to test due to pain in starting position   LOW BACK SPECIAL TEST: SLR: negative  Scour test: negative FABER: negative Pelvic compression/distraction: negative  Slump test positive LLE  6 min walk test: 1160 ft increased left foot drag and antalgic gait pattern with foot eversion    JOINT MOBILITY TESTING:  AP: painful PA: painful Inferior: painful Distraction: reduces pain   Back mobilization hypomobile and painful to grade II mobilizations    PALPATION:  Tenderness to all joint regions of GH     TODAY'S TREATMENT: DATE: 06/25/24    ***  Physical Performance Measures: 6 Min Walk Test:  Instructed patient to ambulate as quickly and as safely as possible for 6 minutes using LRAD. Patient was allowed to take standing rest breaks without stopping the test, but if the patient required a sitting rest break the clock would be stopped and the test would be over.  Results: 905 feet (275.8 meters) using no AD with CGA. Results indicate that the patient has reduced endurance with ambulation compared to age matched norms.  Age Matched Norms: 31-69 yo M: 570m F: 59m, 32-79 yo M: 45 F: 471, 21-89 yo M: 417 F: 392 MDC: 58.21 meters (190.98 feet) or 50 meters (ANPTA Core Set of Outcome Measures for Adults with  Neurologic Conditions, 2018) Vitals assessed throughout due to pt report of SOB, 94 SpO2 94 HR at end of test 1 seated rest break during test at 1:40 seconds remaining, pt able to continue testing with 1 minute remaining  TE- To improve strength, endurance, mobility, and function of specific targeted muscle groups or improve joint range of motion or improve muscle flexibility  Supine: sciatic nerve glide 20x each LE ; leg on SPT shoulder  figure four stretch 60 seconds each LE  Sidelying thoracic rotations x10 each side LE rotations x10 each side Seated swiss ball roll outs x10      NMR: To facilitate reeducation of movement, balance, posture, coordination, and/or proprioception/kinesthetic sense. Kore Balance machine for  weight shift, ankle stabilization, and reaction timing: twisty slopes racing:x2 sets     PATIENT EDUCATION: Education details: goals, POC  Person educated: Patient Education method: Explanation, Demonstration, Tactile cues, Verbal cues, and Handouts Education comprehension: verbalized understanding, returned demonstration, verbal cues required, and tactile cues required  HOME EXERCISE PROGRAM: Access Code: [email or web address] URL: [email or web address] Date: 05/21/2024 Prepared by: Marina  Moser  Exercises - Hooklying Transversus Abdominis Palpation  - 1 x daily - 7 x weekly - 2 sets - 10 reps - 5 hold - Supine March  - 1 x daily - 7 x weekly - 2 sets - 10 reps - 5 hold - Seated Ankle Alphabet  - 1 x daily - 7 x weekly - 2 sets - 1 reps - 5 hold - Standing 'L' Stretch at Counter  - 1 x daily - 7 x weekly - 2 sets - 2 reps - 30 hold  Access Code: [email or web address] URL: [email or web address] Date: 10/03/2023 Prepared by: Marina  Moser  Exercises - Seated Scapular Retraction  - 1 x daily - 7 x weekly - 2 sets - 10 reps - 5 hold - Neck Sidebending  - 1 x daily - 7 x weekly - 2 sets - 2 reps - 30 hold - Supine Isometric Shoulder Extension with Towel  -  1 x daily - 7 x weekly - 2 sets - 10 reps - 5 hold - Supine Isometric Shoulder Adduction with Towel Roll  - 1 x daily - 7 x weekly - 2 sets - 10 reps - 5 hold - Seated Bilateral Shoulder Flexion Towel Slide at Table Top  - 1 x daily - 7 x weekly - 1 sets - 3 reps - 30 hold - Supine Posterior Pelvic Tilt  - 1 x daily - 7 x weekly - 2 sets - 5 reps - 30 hold - Supine LE Neural Mobilization  - 1 x daily - 7 x weekly - 2 sets - 10 reps - 5 hold - Seated Ankle Alphabet  - 1 x daily - 7 x weekly - 2 sets - 1 reps - 5 hold  ASSESSMENT:  CLINICAL IMPRESSION:    ***   OBJECTIVE IMPAIRMENTS: cardiopulmonary status limiting activity, decreased activity tolerance, decreased mobility, increased fascial restrictions, impaired perceived functional ability, increased muscle spasms, impaired flexibility, impaired UE functional use, improper body mechanics, postural dysfunction, and pain.   ACTIVITY LIMITATIONS: carrying, lifting, bed mobility, bathing, dressing, self feeding, reach over head, and caring for others  PARTICIPATION LIMITATIONS: meal prep, cleaning, laundry, personal finances, interpersonal relationship, driving, shopping, community activity, yard work, church, and caregiving for husband  PERSONAL FACTORS: Age, Fitness, Past/current experiences, Time since onset of injury/illness/exacerbation, and 3+ comorbidities: aorta type A dissection in sept 2011 with aortic valve replacement bypass of R coronary artery, AAA, anemia, anxiety, CAD, CHF, depression, GERD, Heart failure, HLD, HTN, IBS, Kidney problems, migraine, osteoarthritis, osteoporosis, RA, ocular migraines, back pain are also affecting patient's functional outcome.   REHAB POTENTIAL: Fair    CLINICAL DECISION MAKING: Evolving/moderate complexity  EVALUATION COMPLEXITY: Moderate   GOALS: Goals reviewed with patient? Yes  SHORT TERM GOALS: Target date: 04/19/2024  Patient will be independent in home exercise program to improve  strength/mobility for better functional independence with ADLs. Baseline:7/23: HEP given 9/16: compliant 1x/week 4/3: improving compliance 5/29: compliant Goal status: MET    LONG TERM GOALS: Target date: 07/12/2024  Patient will increase FOTO score to equal to or greater than  57%   to demonstrate statistically significant improvement  in mobility and quality of life.  Baseline: 7/22: 46 9/16: 50% 10/7: 40% 12/10: 57% Goal status: MET  2.  Patient will improve shoulder AROM to > 140 degrees of flexion, scaption, and abduction for improved ability to perform overhead activities. Baseline: 7/22: see above 9/16: see above 10/7 : defer to exacerbation 12/10: shoulder abduction >140, flexion < 140 , assess scaption next visit 2/6: flexion >140, abduction >140 scaption >140 Goal status: MET  3.  Patient will demonstrate adequate shoulder ROM and strength to be able to shave and dress independently with pain less than 3/10. Baseline: 7/23: 6/10 pain with dressing, limited ROM, unable to test strength 9/16: 3/10 pain with dressing  Goal status: MET  4.  Patient will decrease Quick DASH score by > 8 points demonstrating reduced self-reported upper extremity disability for L and R shoulder  Baseline: 7/23: L shoulder 50% 9/16: R=54.54 % L 66% L 81% R 24% 12/10: R: 43.2% L: 56.8% 2/6: L 22%  R 34%  Goal status: MET  5.  Patient will be able to eat an entire meal without resting arm demonstrating improved functional strength Baseline: 7/23: has to rest while eating due to pain. 9/16: able to make halfway through meal 10/7: can make it about halfway through meal, some meals can get 3/4s through meal 12/10: improved, still has to rest every once in a while 2/6: can get halfway to 2/3rds of the meal. 5/29:  2/3rds of meal  Goal status: deferred at this time to focus on back/leg  6.  Patient will be able to stand 15 minutes without her LLE going numb allowing for ADLS such as showering and iADLs such as  cooking Baseline: 5 minutes 12/10: will assess next visit 2/6: 10 minutes 4/3: 10 minutes 5/29: 5-8 minutes Goal status: IN PROGRESS  7. Patient will increase six minute walk test distance to >1500 for progression to community ambulator and improve gait ability Baseline: 1160 ft  12/10: 1010 ft 2/6: 1140 ft  4/3: 1201 ft 5/29: 774 ft stopped with 1 min 35 seconds remaining due to shortness of breath 6/26: 962ft with one seated rest break at 1:40 seconds remaining due to SOB Goal status: IN PROGRESS  8  Patient will reduce modified Oswestry score to <20 as to demonstrate minimal disability with ADLs including improved sleeping tolerance, walking/sitting tolerance etc for better mobility with ADLs.  Baseline: 2/6: 60% 4/3: 50%  Goal status: In progress   9  Patient will improve df strength by 2lb for improved functional ambulation with decreased foot drag  Baseline: 5/29: L 4.2 R 6.9 Goal status: NEW    PLAN:  PT FREQUENCY: 2x/week  PT DURATION: 8 weeks  PLANNED INTERVENTIONS: Therapeutic exercises, Therapeutic activity, Neuromuscular re-education, Balance training, Gait training, Patient/Family education, Self Care, Joint mobilization, Vestibular training, Canalith repositioning, Visual/preceptual remediation/compensation, Dry Needling, Electrical stimulation, Spinal mobilization, Cryotherapy, Moist heat, Compression bandaging, scar mobilization, Splintting, Taping, Traction, Ultrasound, Biofeedback, Ionotophoresis 4mg /ml Dexamethasone , Manual therapy, and Re-evaluation  PLAN FOR NEXT SESSION:  *** Continue with LUE strengthening  L ankle strengthening, distraction, core activation, reassess scaption and flexion ROM, make HEP    Fonda Simpers, PT, DPT Physical Therapist - [place]  [facility]  06/25/24, 9:14 AM

## 2024-06-25 NOTE — Telephone Encounter (Signed)
 Called and left voicemail to check in on pt since she was a No-Show for today's appointment.  Advised pt to call clinic back.  Fonda Simpers, PT, DPT Physical Therapist - [facility]  06/25/24, 1:59 PM

## 2024-06-26 ENCOUNTER — Ambulatory Visit (INDEPENDENT_AMBULATORY_CARE_PROVIDER_SITE_OTHER): Payer: Medicare HMO | Admitting: Psychology

## 2024-06-26 DIAGNOSIS — F331 Major depressive disorder, recurrent, moderate: Secondary | ICD-10-CM | POA: Diagnosis not present

## 2024-06-26 NOTE — Progress Notes (Signed)
 [facility] Counselor/Therapist Progress Note  Patient ID: Rachel Torres, MRN: [id number],    Date: 06/26/2024  Time Spent: 10:00am-10:50am    Treatment Type: Individual Therapy  Reported Symptoms: stress, overwhelm  Mental Status Exam: Appearance:  Casual     Behavior: Appropriate  Motor: Normal  Speech/Language:  Normal Rate  Affect: Appropriate  Mood: normal  Thought process: normal  Thought content:   WNL  Sensory/Perceptual disturbances:   WNL  Orientation: oriented to person, place, time/date, and situation  Attention: Good  Concentration: Good  Memory: WNL  Fund of knowledge:  Good  Insight:   Good  Judgment:  Good  Impulse Control: Good   Risk Assessment: Danger to Self:  No Self-injurious Behavior: No Danger to Others: No Duty to Warn:no Physical Aggression / Violence:No  Access to Firearms a concern: No  Gang Involvement:No   Subjective: Pt present for face-to-face individual therapy via video.  Pt consents to telehealth video session and is aware of limitations and benefits of virtual sessions.   Location of pt: home Location of therapist: home office.   Pt talked about her husband [name].  Pt had to take him to the ER last week bc he was sick and very confused.   He had a virus and stayed one night in the hospital.   [name] is doing better now.  Pt gets overwhelmed with care giving for [name].  Addressed the stress of caregiving for [name].   Pt gets frustrated with [name] cognitive challenges.  Addressed how this impacts pt.  Pt worries about how [name] will progress.  Worked on [employer], dispensing.  Pt talked about [name] daughter who is an addict and has mental health issues.   Pt has had to help her often but is trying to set healthy boundaries.   Pt states her mother is also frustrating for her bc she calls pt several times a day.    Addressed pt's concerns and worries.   Pt attended a care giver group get together  that was a positive support for her.    Worked on self care strategies. Provided supportive therapy.    Interventions: Cognitive Behavioral Therapy and Insight-Oriented  Diagnosis:  F33.1   Plan of Care: Recommend ongoing therapy.   Pt participated in setting treatment goals.   Pt needs a place to talk about stressors.  Plan to meet every week.  Pt agrees with treatment plan.    Treatment Plan (target date: 03/27/2025) Client Abilities/Strengths  Pt is bright, engaging, and motivated for therapy.   Client Treatment Preferences  Individual therapy.  Client Statement of Needs  Improve coping skills.  Have a place to talk about stressors.   Symptoms  Depressed or irritable mood. Unresolved grief issues.  Problems Addressed  Unipolar Depression Goals 1. Alleviate depressive symptoms and return to previous level of effective functioning. 2. Appropriately grieve the loss in order to normalize mood and to return to previously adaptive level of functioning. Objective Learn and implement behavioral strategies to overcome depression. Target Date: 2025-03-27 Frequency: weekly  Progress: 45 Modality: individual  Related Interventions Engage the client in behavioral activation, increasing his/her activity level and contact with sources of reward, while identifying processes that inhibit activation.  Use behavioral techniques such as instruction, rehearsal, role-playing, role reversal, as needed, to facilitate activity in the client's daily life; reinforce success. Assist the client in developing skills that increase the likelihood of deriving pleasure from behavioral activation (e.g., assertiveness skills, developing an  exercise plan, less internal/more external focus, increased social involvement); reinforce success. Objective Identify important people in life, past and present, and describe the quality, good and poor, of those relationships. Target Date: 2025-03-27 Frequency: weekly   Progress: 45 Modality: individual  Related Interventions Conduct Interpersonal Therapy beginning with the assessment of the client's interpersonal inventory of important past and present relationships; develop a case formulation linking depression to grief, interpersonal role disputes, role transitions, and/or interpersonal deficits). Objective Learn and implement problem-solving and decision-making skills. Target Date: 2025-03-27 Frequency: weekly  Progress: 45 Modality: individual  Related Interventions Conduct Problem-Solving Therapy using techniques such as psychoeducation, modeling, and role-playing to teach client problem-solving skills (i.e., defining a problem specifically, generating possible solutions, evaluating the pros and cons of each solution, selecting and implementing a plan of action, evaluating the efficacy of the plan, accepting or revising the plan); role-play application of the problem-solving skill to a real life issue. Encourage in the client the development of a positive problem orientation in which problems and solving them are viewed as a natural part of life and not something to be feared, despaired, or avoided. 3. Develop healthy interpersonal relationships that lead to the alleviation and help prevent the relapse of depression. 4. Develop healthy thinking patterns and beliefs about self, others, and the world that lead to the alleviation and help prevent the relapse of depression. 5. Recognize, accept, and cope with feelings of depression. Diagnosis F33.1  Conditions For Discharge Achievement of treatment goals and objectives   Veva Alma, LCSW

## 2024-06-27 ENCOUNTER — Ambulatory Visit: Attending: Cardiology | Admitting: Cardiology

## 2024-06-27 ENCOUNTER — Encounter: Payer: Self-pay | Admitting: Cardiology

## 2024-06-27 VITALS — BP 110/68 | HR 69 | Ht 65.0 in | Wt 217.0 lb

## 2024-06-27 DIAGNOSIS — E669 Obesity, unspecified: Secondary | ICD-10-CM

## 2024-06-27 DIAGNOSIS — I5032 Chronic diastolic (congestive) heart failure: Secondary | ICD-10-CM

## 2024-06-27 DIAGNOSIS — I25118 Atherosclerotic heart disease of native coronary artery with other forms of angina pectoris: Secondary | ICD-10-CM

## 2024-06-27 DIAGNOSIS — I7770 Dissection of unspecified artery: Secondary | ICD-10-CM | POA: Diagnosis not present

## 2024-06-27 DIAGNOSIS — Z952 Presence of prosthetic heart valve: Secondary | ICD-10-CM | POA: Diagnosis not present

## 2024-06-27 DIAGNOSIS — I714 Abdominal aortic aneurysm, without rupture, unspecified: Secondary | ICD-10-CM | POA: Diagnosis not present

## 2024-06-27 DIAGNOSIS — E7800 Pure hypercholesterolemia, unspecified: Secondary | ICD-10-CM

## 2024-06-27 DIAGNOSIS — I10 Essential (primary) hypertension: Secondary | ICD-10-CM

## 2024-06-27 DIAGNOSIS — I480 Paroxysmal atrial fibrillation: Secondary | ICD-10-CM

## 2024-06-27 DIAGNOSIS — Z01810 Encounter for preprocedural cardiovascular examination: Secondary | ICD-10-CM | POA: Diagnosis not present

## 2024-06-27 MED ORDER — NITROGLYCERIN 0.4 MG SL SUBL: 0.4000 mg | SUBLINGUAL_TABLET | SUBLINGUAL | 3 refills | Status: AC | PRN

## 2024-06-27 NOTE — Progress Notes (Signed)
 Cardiology Office Note   Date:  06/27/2024  ID:  Rachel Torres, DOB [birth year], MRN [id number] PCP: Randeen Laine LABOR, MD  [facility] Providers Cardiologist:  Evalene Lunger, MD Cardiology APP:  Gerard Frederick, NP  Electrophysiologist:  OLE ONEIDA HOLTS, MD     History of Present Illness Rachel Torres is a 68 y.o. female with a past medical history of aortic dissection type a (08/2010), aortic valve replacement (bicuspid aortic valve [facility]), coronary artery disease s/p CABG x 1 (SVG-RCA), HFrEF with recovered ejection fraction, paroxysmal atrial fibrillation, obstructive sleep apnea, rheumatoid arthritis, chronic back pain, chronic shortness of breath, hypertension, hyperlipidemia, Ehlers-Danlos syndrome, who is here today to follow-up on her coronary artery disease and HFimpEF.   She did several evaluations in the emergency department for chest pain.  Cardiac enzymes were negative.  EKGs were unchanged.  She underwent CT scan in 06/2022 and was transferred to [facility] given her complicated anatomy and images were evaluated by vascular surgeon it was determined there was no significant progression of her disease.  While she was at [facility] had cardiac workup with echocardiogram with PET stress was completed.  CT of the chest showed stable descending aorta repair with a type B aortic dissection with opacification of the false lumen at the level of the renal arteries via lumbar artery.  She was seen in clinic 07/08/2022 had complaints of chest discomfort and pressure that started midsternal radiating to her left jaw and up with rest and exertion without aggravating or alleviating factors.  She was scheduled for Lexiscan  stress testing and repeat echocardiogram at that time.  Echocardiogram revealed an LVEF of 45-50%, global hypokinesis, G2 DD, mild to moderate mitral regurgitation, borderline dilatation of the aortic root measuring 36 mm.  Lexiscan  MPI showed no significant ischemia, normal wall  motion, EF estimated 45%, was considered a low risk scan.  Patient was seen in the ER at [facility] on 03/09/2023 for palpitations and was found to be in atrial fibrillation.  She was started on apixaban .  She had a TEE guided cardioversion on March 03/17/23 and was symptomatic while in atrial fibrillation with fatigue.  They thought the atrial fibrillation was in the setting of an abscess.  She underwent cardioversion and maintained on apixaban .  She was evaluated in the emergency emergency department in [facility] on April 9 with palpitations and tachycardia.  She was medicated for rate control and discharged.  Carvedilol  dose was increased.  Was told [facility] will call and set up a cardioversion.  Patient states nobody called her back to arrange this.  She was interested in getting back to sinus rhythm at that time.  She was set up for cardioversion procedure that was performed on 04/04/2024.  She was shocked 1 time 150 J and converted to sinus rhythm with no immediate postoperative complications.  She was evaluated in clinic in April 2025 by EP.  At that time she was doing well.  She did continue to use Apple Watch to monitor her heart rhythm.  She continued to have abdominal fullness but her weight was decreasing at home by her home scale slowly.  They discussed the potential for A-fib ablation procedure.  She was to have updated echocardiogram and continued on carvedilol  18.75 mg twice daily, started on spironolactone  25 mg daily, and was ordered for updated labs.  She was then seen in clinic 05/09/2024 and overall was doing well from a cardiac perspective.  She has tolerated medication well and is not noted to  decrease in fluid.  She was continued on her current medication regimen.  No further testing was ordered at that time but she would need to be scheduled for updated echocardiogram as well as a CT angio of the chest/abdomen/pelvis for history of dissection of thoracic abdominal aorta that he previously been  followed by vascular if it had not been completed by July.   She was last seen in clinic 05/30/2024 by Dr. Cindie.  She was scheduled for a catheter ablation on July 24, 2024.  She was doing well.  Heart rhythm has been well-controlled recently without breakthrough A-fib noted.  She continues to take her apixaban  without bleeding issues.  She does have atrial fibrillation and flutter that she is symptomatic and associated with decompensated diastolic heart failure.  Rhythm control is indicated.  Due to the declining health of her husband for now she was to continue taking Eliquis  and her ablation would be canceled.  If she were to experience significant increase in her burden of atrial fibrillation we will revisit catheter ablation.  At she would be followed up 6 months.  She returns to clinic today stating that overall she has been doing well from a cardiac perspective.  She continues to complain of abdominal fullness.  And has tolerated her medications.  Denies any chest pain or shortness of breath.  Recently had an upcoming ablation procedure canceled due to ongoing Alzheimer's with her significant other.  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  She also states that she has upcoming surgery for her foot drop that would be under general anesthesia.  States that she has been compliant with her current medication regimen and has not missed any doses of her apixaban  and denies any bleeding with no blood noted in her urine or stool.  She also denies any hospitalizations or visits to the emergency department.  ROS: 10 point review of system has been reviewed and considered negative the exception of what is been listed in the HPI  Studies Reviewed EKG Interpretation Date/Time:  Wednesday June 27 2024 10:49:51 EDT Ventricular Rate:  69 PR Interval:  230 QRS Duration:  108 QT Interval:  442 QTC  Calculation: 473 R Axis:   -18  Text Interpretation: Sinus rhythm with 1st degree A-V block Incomplete left bundle branch block Nonspecific ST and T wave abnormality When compared with ECG of 30-May-2024 10:25, No acute changes Confirmed by Gerard Frederick ([id number]) on 06/27/2024 3:05:18 PM    2D echo 05/04/2024 1. Left ventricular ejection fraction, by estimation, is 50 to 55%. The  left ventricle has low normal function. The left ventricle has no regional  wall motion abnormalities. Left ventricular diastolic parameters were  normal.   2. Right ventricular systolic function is low normal. The right  ventricular size is normal.   3. Right atrial size was mildly dilated.   4. The mitral valve is normal in structure. No evidence of mitral valve  regurgitation.   5. The aortic valve was not well visualized. Aortic valve regurgitation  is not visualized.    Long term monitor, 12/24/2022   The patient was monitored for 5 days, 3 hours.   The predominant rhythm was sinus with an average rate of 90 bpm (range 62-126 bpm in sinus).   There were rare PACs and occasional PVCs (PVC burden 1-2%).   There were at least 10 supraventricular runs, lasting up to 15  seconds with a maximum rate of 188 bpm.   There were also 19 episodes of wide-complex tachycardia labeled as nonsustained ventricular tachycardia lasting up to 15 beats with a maximum rate of 188 bpm.  Review of morphologies just that some/all of these episodes may actually represent SVT with aberrancy.   No sustained arrhythmia or prolonged pause was observed.   Patient triggered events correspond to sinus rhythm, PACs, and PVCs.   Predominantly sinus rhythm with rare PACs and occasional PVCs.  Several episodes of brief supraventricular tachycardia were noted.  There were also several episodes of brief wide-complex tachycardia, favored to represent SVT with aberrancy over NSVT.  No evidence of atrial fibrillation.   NM myocardial w spect,  09/30/2022 Pharmacological myocardial perfusion imaging study with no significant  ischemia Normal wall motion, EF estimated at 45% (significant GI uptake artifact noted) No EKG changes concerning for ischemia at peak stress or in recovery. Low risk scan   TTE, 09/23/2022 1. Left ventricular ejection fraction, by estimation, is 45 to 50%. The left ventricle has mildly decreased function. The left ventricle demonstrates global hypokinesis. Left ventricular diastolic parameters are consistent with Grade II diastolic dysfunction (pseudonormalization).   2. Right ventricular systolic function is normal. The right ventricular size is normal.   3. The mitral valve is normal in structure. Mild to moderate mitral valve regurgitation. No evidence of mitral stenosis.   4. The aortic valve has been repaired/replaced, bioprosthetic valve. Aortic valve regurgitation is not visualized. No aortic stenosis is present. Aortic valve area, by VTI measures 2.28 cm. Aortic valve mean gradient measures 5.0 mmHg.   5. The inferior vena cava is normal in size with greater than 50% respiratory variability, suggesting right atrial pressure of 3 mmHg.   6. There is borderline dilatation of the aortic root, measuring 36 mm.   Comparison(s): 08/02/2019-EF 40-50%.      Risk Assessment/Calculations  CHA2DS2-VASc Score = 5   This indicates a 7.2% annual risk of stroke. The patient's score is based upon: CHF History: 1 HTN History: 1 Diabetes History: 0 Stroke History: 0 Vascular Disease History: 1 Age Score: 1 Gender Score: 1            Physical Exam VS:  BP 110/68   Pulse 69   Ht 5' 5 (1.651 m)   Wt 217 lb (98.4 kg)   SpO2 93%   BMI 36.11 kg/m        Wt Readings from Last 3 Encounters:  06/27/24 217 lb (98.4 kg)  05/30/24 221 lb (100.2 kg)  05/09/24 219 lb 6.4 oz (99.5 kg)    GEN: Well nourished, well developed in no acute distress NECK: No JVD; No carotid bruits CARDIAC: RRR, I/VI systolic murmur  RUSB without rubs or gallops RESPIRATORY:  Clear to auscultation without rales, wheezing or rhonchi  ABDOMEN: Soft, non-tender, non-distended EXTREMITIES:  No edema; No deformity   ASSESSMENT AND PLAN Paroxysmal atrial fibrillation which she is maintaining sinus rhythm on EKG today with first-degree AV block with a rate of 69 with incomplete left bundle and nonspecific ST and T wave changes with no acute change from prior studies.  Ablation procedure has currently been canceled unless she becomes significantly symptomatic with her atrial fibrillation.  She has been continued on apixaban  5 mg twice daily for CHA2DS2-VASc score of at least 5 for stroke prophylaxis as well as carvedilol  18.75 mg twice daily.  Coronary artery disease status post CABG x 1 vessel aortic valve replacement due to bicuspid  aortic valve (2011).  She denies anginal or anginal equivalents.  EKG revealed no ischemic changes noted today.  She has been continued on aspirin  81 mg daily, Imdur  30 mg daily, ezetimibe  10 mg daily.  No further ischemic testing needed at this time.  Aortic valve replacement completed at [facility].  Last echocardiogram was completed in 05/04/2024.  No valvular abnormalities were noted.  Will continue to monitor with surveillance studies.  Dissection of thoracic abdominal aorta followed by vascular surgery in [facility] with Dr. Oris.  Stable on CT in July 2024.  History of dissecting abdominal aortic aneurysm repair.  She has been scheduled for an updated CT angio chest/abdomen/pelvis for dissection.  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  There was recommended to be yearly follow-up.  This was scheduled today.  Chronic HFimpEF with last echocardiogram revealed an LVEF of 50-55%, low normal function, no RWMA, right ventricular systolic function low normal and normal in size, no valvular abnormalities were noted.  Continued on carvedilol , furosemide , lisinopril ,  spironolactone  as well as Farxiga .  She recently had an updated lab work.  She appears to be euvolemic on exam today.  Continues to suffer from NYHA class I-II symptoms on occasion.  Primary hypertension with a blood pressure today 110/68.  Blood pressures remain stable.  She has been continued on carvedilol , Lasix , Imdur , lisinopril , spironolactone .  She has been encouraged to continue to monitor pressures 1 to 2 hours postmedication administration as well.  Pure hypercholesterolemia with an LDL of 112.  She is continued on ezetimibe  10 mg daily.  Goal is 70 or less but ideally 55.  Will discuss reinitiating atorvastatin  on return.  Obesity with BMI 36.11.  Would benefit from weight loss.  Preoperative cardiovascular examination which she required no further cardiovascular testing can proceed at acceptable risk.  Will need to request pharmacy clearance for apixaban  recommendations.    Ms. Sazama's perioperative risk of a major cardiac event is 6.6% according to the Revised Cardiac Risk Index (RCRI).  Therefore, she is at high risk for perioperative complications.   Her functional capacity is fair at 5.07 METs according to the Duke Activity Status Index (DASI). Recommendations: According to ACC/AHA guidelines, no further cardiovascular testing needed.  The patient may proceed to surgery at acceptable risk.         Dispo: Patient  to return to clinic to see MD/APP in 6 months or sooner if needed for further evaluation.  Signed, Makinze Jani, NP

## 2024-06-27 NOTE — Therapy (Signed)
 OUTPATIENT PHYSICAL THERAPY TREATMENT      Patient Name: Rachel Torres MRN: [id number] DOB:04/23/1956, 68 y.o., female Today's Date: 06/27/2024  END OF SESSION:                   Past Medical History:  Diagnosis Date   AAA (abdominal aortic aneurysm) (HCC)    a. Chronic w/o evidence of aneurysmal dil on CTA 01/2016.   Alcohol abuse    Allergy    Anemia    Anxiety    Asthma    CAD (coronary artery disease)    a. 08/2010 s/p CABG x 1 (VG->RCA) @ time of Ao dissection repair; b. 01/2016 Lexiscan  MV: mid antsept/apical defect w/ ? peri-infarct ischemia-->likely attenuation-->Med Rx.   Chewing difficulty    Chronic combined systolic (congestive) and diastolic (congestive) heart failure (HCC)    a. 2012 EF 30-35%; b. 04/2015 EF 40-45%; c. 01/2016 Echo: Ef 55-65%, nl AoV bioprosthesis, nl RV, nl PASP.   Constipation    Depression    Edema of both lower extremities    Gallbladder sludge    GERD (gastroesophageal reflux disease)    Heart failure (HCC)    Heart valve problem    History of Bicuspid Aortic Valve    a. 08/2010 s/p AVR @ time of Ao dissection repair; b. 01/2016 Echo: Ef 55-65%, nl AoV bioprosthesis, nl RV, nl PASP.   Hx of repair of dissecting thoracic aortic aneurysm, Stanford type A    a. 08/2010 s/p repair with AVR and VG->RCA; b. 01/2016 CTA: stable appearance of Asc Thoracic Aortic graft. Opacification of flase lumen of chronic abd Ao dissection w/ retrograde flow through lumbar arteries. No aneurysmal dil of flase lumen.   Hyperlipidemia    Hypertension    Hypertensive heart disease    IBS (irritable bowel syndrome)    Internal hemorrhoids    Kidney problem    Marfan syndrome    pt denies   Migraine    Obstructive sleep apnea    Osteoarthritis    Osteoporosis    Palpitations    Pneumonia    RA (rheumatoid arthritis) (HCC)    a. Followed by Dr. Balinda   Past Surgical History:  Procedure Laterality Date   ABDOMINAL AORTIC ANEURYSM REPAIR      CARDIAC CATHETERIZATION  2001   CARDIOVERSION N/A 04/04/2024   Procedure: CARDIOVERSION;  Surgeon: Perla Evalene PARAS, MD;  Location: [facility] ORS;  Service: Cardiovascular;  Laterality: N/A;   CESAREAN SECTION  1986 & 1991   CHOLECYSTECTOMY     COLONOSCOPY     COLONOSCOPY WITH PROPOFOL  N/A 01/19/2024   Procedure: COLONOSCOPY WITH PROPOFOL ;  Surgeon: Therisa Bi, MD;  Location: [facility] ENDOSCOPY;  Service: Gastroenterology;  Laterality: N/A;   ESOPHAGOGASTRODUODENOSCOPY (EGD) WITH PROPOFOL  N/A 05/08/2020   Procedure: ESOPHAGOGASTRODUODENOSCOPY (EGD) WITH PROPOFOL ;  Surgeon: Janalyn Keene NOVAK, MD;  Location: [facility] ENDOSCOPY;  Service: Endoscopy;  Laterality: N/A;   FRACTURE SURGERY Right    shoulder replacement   HEMORRHOID BANDING     ORIF DISTAL RADIUS FRACTURE Left    POLYPECTOMY  01/19/2024   Procedure: POLYPECTOMY;  Surgeon: Therisa Bi, MD;  Location: [facility] ENDOSCOPY;  Service: Gastroenterology;;   UPPER GASTROINTESTINAL ENDOSCOPY     Patient Active Problem List   Diagnosis Date Noted   Typical atrial flutter (HCC) 04/04/2024   Adenomatous polyp of colon 01/19/2024   Heart failure with improved ejection fraction (HFimpEF) (HCC) 01/18/2024   Abnormal metabolism 11/14/2023   Weight regain after inital weight  loss 11/14/2023   Multiple closed tarsal fractures of left foot, sequela 07/12/2023   Arm contusion, right, sequela 07/12/2023   Lumbar disc disease 07/12/2023   PAF (paroxysmal atrial fibrillation) (HCC) 06/09/2023   Paresthesia 06/03/2023   Obesity, Beginning BMI 35.45 02/17/2023   Tinnitus 01/14/2023   Change in vision 01/14/2023   Encounter for screening colonoscopy 01/14/2023   Personal history of fall 01/14/2023   Problem related to unspecified psychosocial circumstances 12/28/2022   Person encountering health services to consult on behalf of another person 12/28/2022   Palpitation 12/10/2022   Fatigue 10/26/2022   B12 deficiency 10/26/2022   Right shoulder pain 09/15/2022    Depression 09/14/2022   Other constipation 05/12/2022   Medicare annual wellness visit, initial 07/17/2021   TMJ (dislocation of temporomandibular joint) 07/17/2021   Encounter for screening mammogram for breast cancer 06/05/2021   Diarrhea 04/11/2020   Epigastric pain 02/19/2020   Dark stools 02/19/2020   History of atrial fibrillation 02/05/2019   SOB (shortness of breath) 01/27/2019   Irregular surface of cornea 12/25/2018   Acute cough 09/08/2017   History of HPV infection 12/01/2016   Screening mammogram, encounter for 11/29/2016   Estrogen deficiency 11/29/2016   Encounter for routine gynecological examination 11/29/2016   Grade III hemorrhoids 10/06/2016   Tachycardia 08/14/2016   Hemorrhoids 08/13/2016   Atypical migraine 08/03/2016   CAD (coronary artery disease)    AAA (abdominal aortic aneurysm) (HCC)    Chronic combined systolic (congestive) and diastolic (congestive) heart failure (HCC)    Prediabetes 02/11/2016   Generalized anxiety disorder 12/08/2015   Panic attacks 07/28/2015   Chest pain 07/09/2013   Sensorineural hearing loss 03/27/2013   Vitamin D  deficiency 07/03/2012   H/O aortic dissection 05/12/2012   Routine general medical examination at a health care facility 09/20/2011   Atypical chest pain 09/15/2011   S/P AVR (aortic valve replacement) 01/07/2011   CORONARY ARTERY BYPASS GRAFT, HX OF 01/07/2011   Arthritis 12/20/2010   Back pain 12/15/2010   H/O dissecting abdominal aortic aneurysm repair 08/24/2010   IRRITABLE BOWEL SYNDROME 09/09/2009   Obstructive sleep apnea 08/04/2009   External hemorrhoid 06/26/2009   Osteoporosis 01/09/2009   Hyperlipidemia 07/26/2007   Depression with anxiety 07/26/2007   Essential hypertension 07/26/2007   Allergic rhinitis 07/26/2007   Asthma 07/26/2007   GERD 07/26/2007   Osteoarthritis 07/26/2007   Ocular migraine 07/26/2007   Mild intermittent asthma without complication 07/26/2007    PCP: Randeen Laine LABOR MD   REFERRING PROVIDER: Lamar Hamming  REFERRING DIAG: L rotator cuff   THERAPY DIAG:  No diagnosis found.  Rationale for Evaluation and Treatment: Rehabilitation  ONSET DATE: Feb 2024  SUBJECTIVE:  SUBJECTIVE STATEMENT:   ***  Hand dominance: Left  PERTINENT HISTORY: Yaniyah Koors is a 66yoF familiar to our services. Pt referred to PT for strengthening of chronic left shoulder DJD, known remote RC injury. Recently fell and broke four metatarsals, since cleared from use of CAM rocker. She was treated successfully in past for right shoulder pain and has past medical history of Right shoulder surgery replacement. 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 PMH aorta type A dissection in sept 2011 s/p aortic valve replacement, bypass of R coronary artery, anemia, anxiety, CAD, CHF, depression, GERD, Heart failure, HLD, HTN, IBS, migraine, osteoarthritis, osteoporosis, RA, back pain.    Back pain:  Patient has new referral for radiculopathy, lumbar region. MRI showed degeneration of L1-2 and L2-3 with some disc space collapse and minor retrolisthesis of L2-3. Issue began in February, felt numbness in L thigh. No cause known, but she was at a museum and noticed it was numb. Sometimes L foot numbness and toe numbness of 2nd and 3rd toe causing tripping. This caused her to trip July 4th and break five metatarsals. Some minor pain in the low pain noted. Pain noted to be 2-3/10 Pain in hip 3-4/10 of L hip.    PAIN:  Are you having pain? Yes, 3/10 pain in lateral deltoids in the shoulder.  PRECAUTIONS: None  FALLS:  Has patient fallen in last 6 months? Yes. Number of falls 2  PATIENT GOALS:to lift more than 3lb with LUE, be able to eat a meal without arm being tired.    OBJECTIVE:  DIAGNOSTIC FINDINGS:   IMPRESSION: Mild degenerative change in the cervical spine without acute fracture or traumatic subluxation.   IMPRESSION: Full width, full width tears of the supraspinatus and infraspinatus tendons with retraction to the glenoid. Grade 2/3 infraspinatus atrophy.   Moderate tendinosis of the distal subscapularis tendon with articular sided fraying of the cephalad fibers.   Moderate intra-articular long head biceps tendinosis with fraying and possible partial tearing.   Mild glenohumeral and moderate AC joint osteoarthritis.   Degenerative superior labral tearing anteriorly and posteriorly.   PATIENT SURVEYS:  Quick Dash 50%  and FOTO 46%   COGNITION: Overall cognitive status: Within functional limits for tasks assessed                                  SENSATION: WFL   POSTURE: Slight rounded shoulders and shoulder elevation    UPPER EXTREMITY ROM:    Active ROM Right eval 9/16: recert RUE Left eval 9/16 RECERT LUE  Shoulder flexion 64 122 91 92  Shoulder extension        Shoulder abduction 95 88 52 82  Shoulder adduction        Shoulder internal rotation Unable to test at 90 61 degrees 5 40  Shoulder external rotation 18at side of body -5 degrees at 90 degrees 34 35  Elbow flexion [facility]  WFL   Elbow extension [facility]  WFL   Wrist flexion        Wrist extension        Wrist ulnar deviation        Wrist radial deviation        Wrist pronation        Wrist supination        (Blank rows = not tested)   UPPER EXTREMITY MMT:   MMT Right eval Left eval  Shoulder flexion  Shoulder extension      Shoulder abduction      Shoulder adduction      Shoulder internal rotation      Shoulder external rotation      Middle trapezius      Lower trapezius      Elbow flexion      Elbow extension      Wrist flexion      Wrist extension      Wrist ulnar deviation      Wrist radial deviation      Wrist pronation      Wrist supination      Grip strength (lbs) 30 35   (Blank rows = not tested) Unable to test strength due to pain with motion   TRUNK ROM: Trunk Flexion 65  Trunk Extension 7  Trunk R SB Limited 25%  Trunk L SB WFL  Trunk R rotation WFL  Trunk L rotation Limited 25%    Ankle ROM Seated: Motion: AROM seated Right Left  Plantarflexion 4+ 3  Dorsiflexion 4+ 4  Inversion 4 3+  Eversion 4+ 3+    SHOULDER SPECIAL TESTS: Impingement tests:  unable to test due to pain in starting position SLAP lesions:  unable to test due to pain in starting position Instability tests:  unable to test due to pain in starting position Rotator cuff assessment:  unable to test due to pain in starting position Biceps assessment:  unable to test due to pain in starting position   LOW BACK SPECIAL TEST: SLR: negative  Scour test: negative FABER: negative Pelvic compression/distraction: negative  Slump test positive LLE  6 min walk test: 1160 ft increased left foot drag and antalgic gait pattern with foot eversion    JOINT MOBILITY TESTING:  AP: painful PA: painful Inferior: painful Distraction: reduces pain   Back mobilization hypomobile and painful to grade II mobilizations    PALPATION:  Tenderness to all joint regions of GH     TODAY'S TREATMENT: DATE: 06/27/24    TE- To improve strength, endurance, mobility, and function of specific targeted muscle groups or improve joint range of motion or improve muscle flexibility  Nu-Step for dynamic warm-up and cardiovascular endurance x4 mins  Supine: sciatic nerve glide 20x each LE ; leg on SPT shoulder  figure four stretch 60 seconds each LE  Sidelying thoracic rotations x10 each side LE rotations x10 each side Seated swiss ball roll outs x10      NMR: To facilitate reeducation of movement, balance, posture, coordination, and/or proprioception/kinesthetic sense. Kore Balance machine for weight shift, ankle stabilization, and reaction timing: twisty slopes racing:x2 sets     PATIENT  EDUCATION: Education details: goals, POC  Person educated: Patient Education method: Explanation, Demonstration, Tactile cues, Verbal cues, and Handouts Education comprehension: verbalized understanding, returned demonstration, verbal cues required, and tactile cues required  HOME EXERCISE PROGRAM: Access Code: [email or web address] URL: [email or web address] Date: 05/21/2024 Prepared by: Marina  Moser  Exercises - Hooklying Transversus Abdominis Palpation  - 1 x daily - 7 x weekly - 2 sets - 10 reps - 5 hold - Supine March  - 1 x daily - 7 x weekly - 2 sets - 10 reps - 5 hold - Seated Ankle Alphabet  - 1 x daily - 7 x weekly - 2 sets - 1 reps - 5 hold - Standing 'L' Stretch at Counter  - 1 x daily - 7 x weekly - 2 sets - 2 reps - 30 hold  Access Code: [email or web address]  URL: [email or web address] Date: 10/03/2023 Prepared by: Marina  Moser  Exercises - Seated Scapular Retraction  - 1 x daily - 7 x weekly - 2 sets - 10 reps - 5 hold - Neck Sidebending  - 1 x daily - 7 x weekly - 2 sets - 2 reps - 30 hold - Supine Isometric Shoulder Extension with Towel  - 1 x daily - 7 x weekly - 2 sets - 10 reps - 5 hold - Supine Isometric Shoulder Adduction with Towel Roll  - 1 x daily - 7 x weekly - 2 sets - 10 reps - 5 hold - Seated Bilateral Shoulder Flexion Towel Slide at Table Top  - 1 x daily - 7 x weekly - 1 sets - 3 reps - 30 hold - Supine Posterior Pelvic Tilt  - 1 x daily - 7 x weekly - 2 sets - 5 reps - 30 hold - Supine LE Neural Mobilization  - 1 x daily - 7 x weekly - 2 sets - 10 reps - 5 hold - Seated Ankle Alphabet  - 1 x daily - 7 x weekly - 2 sets - 1 reps - 5 hold  ASSESSMENT:  CLINICAL IMPRESSION:   *** Pt will continue to benefit from skilled therapy to address remaining deficits in order to improve overall QoL and return to PLOF.     OBJECTIVE IMPAIRMENTS: cardiopulmonary status limiting activity, decreased activity tolerance, decreased mobility, increased fascial  restrictions, impaired perceived functional ability, increased muscle spasms, impaired flexibility, impaired UE functional use, improper body mechanics, postural dysfunction, and pain.   ACTIVITY LIMITATIONS: carrying, lifting, bed mobility, bathing, dressing, self feeding, reach over head, and caring for others  PARTICIPATION LIMITATIONS: meal prep, cleaning, laundry, personal finances, interpersonal relationship, driving, shopping, community activity, yard work, church, and caregiving for husband  PERSONAL FACTORS: Age, Fitness, Past/current experiences, Time since onset of injury/illness/exacerbation, and 3+ comorbidities: aorta type A dissection in sept 2011 with aortic valve replacement bypass of R coronary artery, AAA, anemia, anxiety, CAD, CHF, depression, GERD, Heart failure, HLD, HTN, IBS, Kidney problems, migraine, osteoarthritis, osteoporosis, RA, ocular migraines, back pain are also affecting patient's functional outcome.   REHAB POTENTIAL: Fair    CLINICAL DECISION MAKING: Evolving/moderate complexity  EVALUATION COMPLEXITY: Moderate   GOALS: Goals reviewed with patient? Yes  SHORT TERM GOALS: Target date: 04/19/2024    Patient will be independent in home exercise program to improve strength/mobility for better functional independence with ADLs. Baseline:7/23: HEP given 9/16: compliant 1x/week 4/3: improving compliance 5/29: compliant Goal status: MET    LONG TERM GOALS: Target date: 07/12/2024      Patient will increase FOTO score to equal to or greater than  57%   to demonstrate statistically significant improvement in mobility and quality of life.  Baseline: 7/22: 46 9/16: 50% 10/7: 40% 12/10: 57% Goal status: MET  2.  Patient will improve shoulder AROM to > 140 degrees of flexion, scaption, and abduction for improved ability to perform overhead activities. Baseline: 7/22: see above 9/16: see above 10/7 : defer to exacerbation 12/10: shoulder abduction >140,  flexion < 140 , assess scaption next visit 2/6: flexion >140, abduction >140 scaption >140 Goal status: MET  3.  Patient will demonstrate adequate shoulder ROM and strength to be able to shave and dress independently with pain less than 3/10. Baseline: 7/23: 6/10 pain with dressing, limited ROM, unable to test strength 9/16: 3/10 pain with dressing  Goal status: MET  4.  Patient will decrease Quick DASH  score by > 8 points demonstrating reduced self-reported upper extremity disability for L and R shoulder  Baseline: 7/23: L shoulder 50% 9/16: R=54.54 % L 66% L 81% R 24% 12/10: R: 43.2% L: 56.8% 2/6: L 22%  R 34%  Goal status: MET  5.  Patient will be able to eat an entire meal without resting arm demonstrating improved functional strength Baseline: 7/23: has to rest while eating due to pain. 9/16: able to make halfway through meal 10/7: can make it about halfway through meal, some meals can get 3/4s through meal 12/10: improved, still has to rest every once in a while 2/6: can get halfway to 2/3rds of the meal. 5/29:  2/3rds of meal  Goal status: deferred at this time to focus on back/leg  6.  Patient will be able to stand 15 minutes without her LLE going numb allowing for ADLS such as showering and iADLs such as cooking Baseline: 5 minutes 12/10: will assess next visit 2/6: 10 minutes 4/3: 10 minutes 5/29: 5-8 minutes Goal status: IN PROGRESS  7. Patient will increase six minute walk test distance to >1500 for progression to community ambulator and improve gait ability Baseline: 1160 ft  12/10: 1010 ft 2/6: 1140 ft  4/3: 1201 ft 5/29: 774 ft stopped with 1 min 35 seconds remaining due to shortness of breath 6/26: 945ft with one seated rest break at 1:40 seconds remaining due to SOB Goal status: IN PROGRESS  8  Patient will reduce modified Oswestry score to <20 as to demonstrate minimal disability with ADLs including improved sleeping tolerance, walking/sitting tolerance etc for better  mobility with ADLs.  Baseline: 2/6: 60% 4/3: 50%  Goal status: In progress   9  Patient will improve df strength by 2lb for improved functional ambulation with decreased foot drag  Baseline: 5/29: L 4.2 R 6.9 Goal status: NEW PLAN:  PT FREQUENCY: 2x/week  PT DURATION: 8 weeks  PLANNED INTERVENTIONS: Therapeutic exercises, Therapeutic activity, Neuromuscular re-education, Balance training, Gait training, Patient/Family education, Self Care, Joint mobilization, Vestibular training, Canalith repositioning, Visual/preceptual remediation/compensation, Dry Needling, Electrical stimulation, Spinal mobilization, Cryotherapy, Moist heat, Compression bandaging, scar mobilization, Splintting, Taping, Traction, Ultrasound, Biofeedback, Ionotophoresis 4mg /ml Dexamethasone , Manual therapy, and Re-evaluation  PLAN FOR NEXT SESSION:  Continue with LUE strengthening  L ankle strengthening, distraction, core activation, reassess scaption and flexion ROM, make HEP   Janell Axe, SPT  This entire session was performed under direct supervision and direction of a licensed Estate agent . I have personally read, edited and approve of the note as written.  Marina  Leopoldo KLEIN ,DPT Physical Therapist- [facility]    06/27/24, 4:52 PM

## 2024-06-27 NOTE — Patient Instructions (Signed)
 Medication Instructions:  Your physician recommends that you continue on your current medications as directed. Please refer to the Current Medication list given to you today.   *If you need a refill on your cardiac medications before your next appointment, please call your pharmacy*  Lab Work: No labs ordered today  If you have labs (blood work) drawn today and your tests are completely normal, you will receive your results only by: MyChart Message (if you have MyChart) OR A paper copy in the mail If you have any lab test that is abnormal or we need to change your treatment, we will call you to review the results.  Testing/Procedures: CT Angiography (CTA) chest abdomen pelvis dissection  is a special type of CT scan that uses a computer to produce multi-dimensional views of major blood vessels throughout the body. In CT angiography, a contrast material is injected through an IV to help visualize the blood vessels  Nothing to eat or drink 4 hours prior to test  [facility] [address]  [place], [place] [address]   Follow-Up: At [facility], you and your health needs are our priority.  As part of our continuing mission to provide you with exceptional heart care, our providers are all part of one team.  This team includes your primary Cardiologist (physician) and Advanced Practice Providers or APPs (Physician Assistants and Nurse Practitioners) who all work together to provide you with the care you need, when you need it.  Your next appointment:   6 month(s)  Provider:   Timothy Gollan, MD or Tylene Lunch, NP

## 2024-06-28 ENCOUNTER — Ambulatory Visit: Payer: Medicare HMO

## 2024-06-28 DIAGNOSIS — R293 Abnormal posture: Secondary | ICD-10-CM | POA: Diagnosis present

## 2024-06-28 DIAGNOSIS — M25612 Stiffness of left shoulder, not elsewhere classified: Secondary | ICD-10-CM | POA: Diagnosis present

## 2024-06-28 DIAGNOSIS — R278 Other lack of coordination: Secondary | ICD-10-CM | POA: Diagnosis present

## 2024-06-28 DIAGNOSIS — M5459 Other low back pain: Secondary | ICD-10-CM | POA: Diagnosis present

## 2024-06-28 DIAGNOSIS — M6281 Muscle weakness (generalized): Secondary | ICD-10-CM

## 2024-06-28 DIAGNOSIS — R2689 Other abnormalities of gait and mobility: Secondary | ICD-10-CM

## 2024-07-02 ENCOUNTER — Ambulatory Visit: Payer: Medicare HMO

## 2024-07-02 ENCOUNTER — Telehealth: Payer: Self-pay

## 2024-07-02 DIAGNOSIS — M5459 Other low back pain: Secondary | ICD-10-CM | POA: Diagnosis not present

## 2024-07-02 DIAGNOSIS — R2689 Other abnormalities of gait and mobility: Secondary | ICD-10-CM

## 2024-07-02 DIAGNOSIS — M25612 Stiffness of left shoulder, not elsewhere classified: Secondary | ICD-10-CM | POA: Diagnosis not present

## 2024-07-02 DIAGNOSIS — R278 Other lack of coordination: Secondary | ICD-10-CM | POA: Diagnosis not present

## 2024-07-02 DIAGNOSIS — M6281 Muscle weakness (generalized): Secondary | ICD-10-CM | POA: Diagnosis not present

## 2024-07-02 DIAGNOSIS — R293 Abnormal posture: Secondary | ICD-10-CM | POA: Diagnosis not present

## 2024-07-02 NOTE — Therapy (Signed)
 OUTPATIENT PHYSICAL THERAPY TREATMENT      Patient Name: Rachel Torres MRN: [id number] DOB:[birth year], 68 y.o., female Today's Date: 07/02/2024  END OF SESSION:  PT End of Session - 07/02/24 1137     Visit Number 43    Number of Visits 52    Date for PT Re-Evaluation 07/12/24    Authorization Type [employer]  Medicare HMO    Authorization Time Period 01/26/24-03/22/24    Progress Note Due on Visit 10    PT Start Time 1142    PT Stop Time 1225    PT Time Calculation (min) 43 min    Equipment Utilized During Treatment Gait belt    Activity Tolerance Patient tolerated treatment well;No increased pain    Behavior During Therapy WFL for tasks assessed/performed                           Past Medical History:  Diagnosis Date   AAA (abdominal aortic aneurysm) (HCC)    a. Chronic w/o evidence of aneurysmal dil on CTA 01/2016.   Alcohol abuse    Allergy    Anemia    Anxiety    Asthma    CAD (coronary artery disease)    a. 08/2010 s/p CABG x 1 (VG->RCA) @ time of Ao dissection repair; b. 01/2016 Lexiscan  MV: mid antsept/apical defect w/ ? peri-infarct ischemia-->likely attenuation-->Med Rx.   Chewing difficulty    Chronic combined systolic (congestive) and diastolic (congestive) heart failure (HCC)    a. 2012 EF 30-35%; b. 04/2015 EF 40-45%; c. 01/2016 Echo: Ef 55-65%, nl AoV bioprosthesis, nl RV, nl PASP.   Constipation    Depression    Edema of both lower extremities    Gallbladder sludge    GERD (gastroesophageal reflux disease)    Heart failure (HCC)    Heart valve problem    History of Bicuspid Aortic Valve    a. 08/2010 s/p AVR @ time of Ao dissection repair; b. 01/2016 Echo: Ef 55-65%, nl AoV bioprosthesis, nl RV, nl PASP.   Hx of repair of dissecting thoracic aortic aneurysm, Stanford type A    a. 08/2010 s/p repair with AVR and VG->RCA; b. 01/2016 CTA: stable appearance of Asc Thoracic Aortic graft. Opacification of flase lumen of chronic abd Ao dissection w/  retrograde flow through lumbar arteries. No aneurysmal dil of flase lumen.   Hyperlipidemia    Hypertension    Hypertensive heart disease    IBS (irritable bowel syndrome)    Internal hemorrhoids    Kidney problem    Marfan syndrome    pt denies   Migraine    Obstructive sleep apnea    Osteoarthritis    Osteoporosis    Palpitations    Pneumonia    RA (rheumatoid arthritis) (HCC)    a. Followed by Dr. Balinda   Past Surgical History:  Procedure Laterality Date   ABDOMINAL AORTIC ANEURYSM REPAIR     CARDIAC CATHETERIZATION  2001   CARDIOVERSION N/A 04/04/2024   Procedure: CARDIOVERSION;  Surgeon: Perla Evalene PARAS, MD;  Location: [facility] ORS;  Service: Cardiovascular;  Laterality: N/A;   CESAREAN SECTION  1986 & 1991   CHOLECYSTECTOMY     COLONOSCOPY     COLONOSCOPY WITH PROPOFOL  N/A 01/19/2024   Procedure: COLONOSCOPY WITH PROPOFOL ;  Surgeon: Therisa Bi, MD;  Location: [facility] ENDOSCOPY;  Service: Gastroenterology;  Laterality: N/A;   ESOPHAGOGASTRODUODENOSCOPY (EGD) WITH PROPOFOL  N/A 05/08/2020   Procedure: ESOPHAGOGASTRODUODENOSCOPY (EGD) WITH PROPOFOL ;  Surgeon: Janalyn Keene NOVAK, MD;  Location: [facility] ENDOSCOPY;  Service: Endoscopy;  Laterality: N/A;   FRACTURE SURGERY Right    shoulder replacement   HEMORRHOID BANDING     ORIF DISTAL RADIUS FRACTURE Left    POLYPECTOMY  01/19/2024   Procedure: POLYPECTOMY;  Surgeon: Therisa Bi, MD;  Location: [facility] ENDOSCOPY;  Service: Gastroenterology;;   UPPER GASTROINTESTINAL ENDOSCOPY     Patient Active Problem List   Diagnosis Date Noted   Typical atrial flutter (HCC) 04/04/2024   Adenomatous polyp of colon 01/19/2024   Heart failure with improved ejection fraction (HFimpEF) (HCC) 01/18/2024   Abnormal metabolism 11/14/2023   Weight regain after inital weight loss 11/14/2023   Multiple closed tarsal fractures of left foot, sequela 07/12/2023   Arm contusion, right, sequela 07/12/2023   Lumbar disc disease 07/12/2023   PAF (paroxysmal  atrial fibrillation) (HCC) 06/09/2023   Paresthesia 06/03/2023   Obesity, Beginning BMI 35.45 02/17/2023   Tinnitus 01/14/2023   Change in vision 01/14/2023   Encounter for screening colonoscopy 01/14/2023   Personal history of fall 01/14/2023   Problem related to unspecified psychosocial circumstances 12/28/2022   Person encountering health services to consult on behalf of another person 12/28/2022   Palpitation 12/10/2022   Fatigue 10/26/2022   B12 deficiency 10/26/2022   Right shoulder pain 09/15/2022   Depression 09/14/2022   Other constipation 05/12/2022   Medicare annual wellness visit, initial 07/17/2021   TMJ (dislocation of temporomandibular joint) 07/17/2021   Encounter for screening mammogram for breast cancer 06/05/2021   Diarrhea 04/11/2020   Epigastric pain 02/19/2020   Dark stools 02/19/2020   History of atrial fibrillation 02/05/2019   SOB (shortness of breath) 01/27/2019   Irregular surface of cornea 12/25/2018   Acute cough 09/08/2017   History of HPV infection 12/01/2016   Screening mammogram, encounter for 11/29/2016   Estrogen deficiency 11/29/2016   Encounter for routine gynecological examination 11/29/2016   Grade III hemorrhoids 10/06/2016   Tachycardia 08/14/2016   Hemorrhoids 08/13/2016   Atypical migraine 08/03/2016   CAD (coronary artery disease)    AAA (abdominal aortic aneurysm) (HCC)    Chronic combined systolic (congestive) and diastolic (congestive) heart failure (HCC)    Prediabetes 02/11/2016   Generalized anxiety disorder 12/08/2015   Panic attacks 07/28/2015   Chest pain 07/09/2013   Sensorineural hearing loss 03/27/2013   Vitamin D  deficiency 07/03/2012   H/O aortic dissection 05/12/2012   Routine general medical examination at a health care facility 09/20/2011   Atypical chest pain 09/15/2011   S/P AVR (aortic valve replacement) 01/07/2011   CORONARY ARTERY BYPASS GRAFT, HX OF 01/07/2011   Arthritis 12/20/2010   Back pain  12/15/2010   H/O dissecting abdominal aortic aneurysm repair 08/24/2010   IRRITABLE BOWEL SYNDROME 09/09/2009   Obstructive sleep apnea 08/04/2009   External hemorrhoid 06/26/2009   Osteoporosis 01/09/2009   Hyperlipidemia 07/26/2007   Depression with anxiety 07/26/2007   Essential hypertension 07/26/2007   Allergic rhinitis 07/26/2007   Asthma 07/26/2007   GERD 07/26/2007   Osteoarthritis 07/26/2007   Ocular migraine 07/26/2007   Mild intermittent asthma without complication 07/26/2007    PCP: Randeen Laine LABOR MD   REFERRING PROVIDER: Lamar Hamming  REFERRING DIAG: L rotator cuff   THERAPY DIAG:  Abnormal posture  Other abnormalities of gait and mobility  Other low back pain  Muscle weakness (generalized)  Other lack of coordination  Rationale for Evaluation and Treatment: Rehabilitation  ONSET DATE: Feb 2024  SUBJECTIVE:  SUBJECTIVE STATEMENT:   Pt reports she is tired today. Pt states that her knee pain has shifted from the inside of her knee to the top of her knee.   Hand dominance: Left  PERTINENT HISTORY: Megyn Leng is a 66yoF familiar to our services. Pt referred to PT for strengthening of chronic left shoulder DJD, known remote RC injury. Recently fell and broke four metatarsals, since cleared from use of CAM rocker. She was treated successfully in past for right shoulder pain and has past medical history of Right shoulder surgery replacement. 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 PMH aorta type A dissection in sept 2011 s/p aortic valve replacement, bypass of R coronary artery, anemia, anxiety, CAD, CHF, depression, GERD, Heart failure, HLD, HTN, IBS, migraine, osteoarthritis, osteoporosis, RA, back pain.    Back pain:  Patient has new referral for radiculopathy, lumbar  region. MRI showed degeneration of L1-2 and L2-3 with some disc space collapse and minor retrolisthesis of L2-3. Issue began in February, felt numbness in L thigh. No cause known, but she was at a museum and noticed it was numb. Sometimes L foot numbness and toe numbness of 2nd and 3rd toe causing tripping. This caused her to trip July 4th and break five metatarsals. Some minor pain in the low pain noted. Pain noted to be 2-3/10 Pain in hip 3-4/10 of L hip.    PAIN:  Are you having pain? Yes, 3/10 pain in lateral deltoids in the shoulder.  PRECAUTIONS: None  FALLS:  Has patient fallen in last 6 months? Yes. Number of falls 2  PATIENT GOALS:to lift more than 3lb with LUE, be able to eat a meal without arm being tired.    OBJECTIVE:  DIAGNOSTIC FINDINGS:  IMPRESSION: Mild degenerative change in the cervical spine without acute fracture or traumatic subluxation.   IMPRESSION: Full width, full width tears of the supraspinatus and infraspinatus tendons with retraction to the glenoid. Grade 2/3 infraspinatus atrophy.   Moderate tendinosis of the distal subscapularis tendon with articular sided fraying of the cephalad fibers.   Moderate intra-articular long head biceps tendinosis with fraying and possible partial tearing.   Mild glenohumeral and moderate AC joint osteoarthritis.   Degenerative superior labral tearing anteriorly and posteriorly.   PATIENT SURVEYS:  Quick Dash 50%  and FOTO 46%   COGNITION: Overall cognitive status: Within functional limits for tasks assessed                                  SENSATION: WFL   POSTURE: Slight rounded shoulders and shoulder elevation    UPPER EXTREMITY ROM:    Active ROM Right eval 9/16: recert RUE Left eval 9/16 RECERT LUE  Shoulder flexion 64 122 91 92  Shoulder extension        Shoulder abduction 95 88 52 82  Shoulder adduction        Shoulder internal rotation Unable to test at 90 61 degrees 5 40  Shoulder external  rotation 18at side of body -5 degrees at 90 degrees 34 35  Elbow flexion [facility]  WFL   Elbow extension [facility]  WFL   Wrist flexion        Wrist extension        Wrist ulnar deviation        Wrist radial deviation        Wrist pronation        Wrist supination        (  Blank rows = not tested)   UPPER EXTREMITY MMT:   MMT Right eval Left eval  Shoulder flexion      Shoulder extension      Shoulder abduction      Shoulder adduction      Shoulder internal rotation      Shoulder external rotation      Middle trapezius      Lower trapezius      Elbow flexion      Elbow extension      Wrist flexion      Wrist extension      Wrist ulnar deviation      Wrist radial deviation      Wrist pronation      Wrist supination      Grip strength (lbs) 30 35  (Blank rows = not tested) Unable to test strength due to pain with motion   TRUNK ROM: Trunk Flexion 65  Trunk Extension 7  Trunk R SB Limited 25%  Trunk L SB WFL  Trunk R rotation WFL  Trunk L rotation Limited 25%    Ankle ROM Seated: Motion: AROM seated Right Left  Plantarflexion 4+ 3  Dorsiflexion 4+ 4  Inversion 4 3+  Eversion 4+ 3+    SHOULDER SPECIAL TESTS: Impingement tests:  unable to test due to pain in starting position SLAP lesions:  unable to test due to pain in starting position Instability tests:  unable to test due to pain in starting position Rotator cuff assessment:  unable to test due to pain in starting position Biceps assessment:  unable to test due to pain in starting position   LOW BACK SPECIAL TEST: SLR: negative  Scour test: negative FABER: negative Pelvic compression/distraction: negative  Slump test positive LLE  6 min walk test: 1160 ft increased left foot drag and antalgic gait pattern with foot eversion    JOINT MOBILITY TESTING:  AP: painful PA: painful Inferior: painful Distraction: reduces pain   Back mobilization hypomobile and painful to grade II mobilizations    PALPATION:   Tenderness to all joint regions of GH     TODAY'S TREATMENT: DATE: 07/02/24    TE- To improve strength, endurance, mobility, and function of specific targeted muscle groups or improve joint range of motion or improve muscle flexibility   Supine: sciatic nerve glide 20x each LE ; 2 rounds- leg on SPT shoulder  figure four stretch 60 seconds each LE; 2 rounds LE rotations x15 Pelvic tilts x15   Seated: Swiss ball roll-outs x multiple reps  LAQ with 4# AW x15 each LE Marches with 4# AW x15 each LE Adduction squeeze with rainbow ball x15 Adduction squeeze with lateral toe taps x15 each LE    NMR: To facilitate reeducation of movement, balance, posture, coordination, and/or proprioception/kinesthetic sense. BAPS Board: - PF/DF x15  - INV/EV x15 each - circles x15 each direction - Static hold x 2 minutes  Airex balance circuit- 45 seconds each: - static stance - eyes closed - narrow stance - toe taps on side of treadmill - marches - SLS; 2 rounds each side    PATIENT EDUCATION: Education details: goals, POC  Person educated: Patient Education method: Explanation, Demonstration, Tactile cues, Verbal cues, and Handouts Education comprehension: verbalized understanding, returned demonstration, verbal cues required, and tactile cues required  HOME EXERCISE PROGRAM: Access Code: [email or web address] URL: [email or web address] Date: 05/21/2024 Prepared by: Marina  Moser  Exercises - Hooklying Transversus Abdominis Palpation  - 1 x daily - 7 x weekly -  2 sets - 10 reps - 5 hold - Supine March  - 1 x daily - 7 x weekly - 2 sets - 10 reps - 5 hold - Seated Ankle Alphabet  - 1 x daily - 7 x weekly - 2 sets - 1 reps - 5 hold - Standing 'L' Stretch at Counter  - 1 x daily - 7 x weekly - 2 sets - 2 reps - 30 hold  Access Code: [email or web address] URL: [email or web address] Date: 10/03/2023 Prepared by: Marina  Moser  Exercises - Seated Scapular Retraction  - 1 x daily  - 7 x weekly - 2 sets - 10 reps - 5 hold - Neck Sidebending  - 1 x daily - 7 x weekly - 2 sets - 2 reps - 30 hold - Supine Isometric Shoulder Extension with Towel  - 1 x daily - 7 x weekly - 2 sets - 10 reps - 5 hold - Supine Isometric Shoulder Adduction with Towel Roll  - 1 x daily - 7 x weekly - 2 sets - 10 reps - 5 hold - Seated Bilateral Shoulder Flexion Towel Slide at Table Top  - 1 x daily - 7 x weekly - 1 sets - 3 reps - 30 hold - Supine Posterior Pelvic Tilt  - 1 x daily - 7 x weekly - 2 sets - 5 reps - 30 hold - Supine LE Neural Mobilization  - 1 x daily - 7 x weekly - 2 sets - 10 reps - 5 hold - Seated Ankle Alphabet  - 1 x daily - 7 x weekly - 2 sets - 1 reps - 5 hold  ASSESSMENT:  CLINICAL IMPRESSION:   Pt tolerated today's session well with a focus on dynamic balance and L ankle strengthening to prepare for pt's upcoming appointment with surgeon to address L drop foot. Pt has been able to progress dynamic ankle stabilization exercises and LE strengthening today without an increase in pain. Pt remains motivated to participate and improve her balance and strength. Pt will continue to benefit from skilled therapy to address remaining deficits in order to improve overall QoL and return to PLOF.     OBJECTIVE IMPAIRMENTS: cardiopulmonary status limiting activity, decreased activity tolerance, decreased mobility, increased fascial restrictions, impaired perceived functional ability, increased muscle spasms, impaired flexibility, impaired UE functional use, improper body mechanics, postural dysfunction, and pain.   ACTIVITY LIMITATIONS: carrying, lifting, bed mobility, bathing, dressing, self feeding, reach over head, and caring for others  PARTICIPATION LIMITATIONS: meal prep, cleaning, laundry, personal finances, interpersonal relationship, driving, shopping, community activity, yard work, church, and caregiving for husband  PERSONAL FACTORS: Age, Fitness, Past/current experiences, Time  since onset of injury/illness/exacerbation, and 3+ comorbidities: aorta type A dissection in sept 2011 with aortic valve replacement bypass of R coronary artery, AAA, anemia, anxiety, CAD, CHF, depression, GERD, Heart failure, HLD, HTN, IBS, Kidney problems, migraine, osteoarthritis, osteoporosis, RA, ocular migraines, back pain are also affecting patient's functional outcome.   REHAB POTENTIAL: Fair    CLINICAL DECISION MAKING: Evolving/moderate complexity  EVALUATION COMPLEXITY: Moderate   GOALS: Goals reviewed with patient? Yes  SHORT TERM GOALS: Target date: 04/19/2024    Patient will be independent in home exercise program to improve strength/mobility for better functional independence with ADLs. Baseline:7/23: HEP given 9/16: compliant 1x/week 4/3: improving compliance 5/29: compliant Goal status: MET    LONG TERM GOALS: Target date: 07/12/2024      Patient will increase FOTO score to equal to or greater than  57%  to demonstrate statistically significant improvement in mobility and quality of life.  Baseline: 7/22: 46 9/16: 50% 10/7: 40% 12/10: 57% Goal status: MET  2.  Patient will improve shoulder AROM to > 140 degrees of flexion, scaption, and abduction for improved ability to perform overhead activities. Baseline: 7/22: see above 9/16: see above 10/7 : defer to exacerbation 12/10: shoulder abduction >140, flexion < 140 , assess scaption next visit 2/6: flexion >140, abduction >140 scaption >140 Goal status: MET  3.  Patient will demonstrate adequate shoulder ROM and strength to be able to shave and dress independently with pain less than 3/10. Baseline: 7/23: 6/10 pain with dressing, limited ROM, unable to test strength 9/16: 3/10 pain with dressing  Goal status: MET  4.  Patient will decrease Quick DASH score by > 8 points demonstrating reduced self-reported upper extremity disability for L and R shoulder  Baseline: 7/23: L shoulder 50% 9/16: R=54.54 % L 66% L 81% R  24% 12/10: R: 43.2% L: 56.8% 2/6: L 22%  R 34%  Goal status: MET  5.  Patient will be able to eat an entire meal without resting arm demonstrating improved functional strength Baseline: 7/23: has to rest while eating due to pain. 9/16: able to make halfway through meal 10/7: can make it about halfway through meal, some meals can get 3/4s through meal 12/10: improved, still has to rest every once in a while 2/6: can get halfway to 2/3rds of the meal. 5/29:  2/3rds of meal  Goal status: deferred at this time to focus on back/leg  6.  Patient will be able to stand 15 minutes without her LLE going numb allowing for ADLS such as showering and iADLs such as cooking Baseline: 5 minutes 12/10: will assess next visit 2/6: 10 minutes 4/3: 10 minutes 5/29: 5-8 minutes Goal status: IN PROGRESS  7. Patient will increase six minute walk test distance to >1500 for progression to community ambulator and improve gait ability Baseline: 1160 ft  12/10: 1010 ft 2/6: 1140 ft  4/3: 1201 ft 5/29: 774 ft stopped with 1 min 35 seconds remaining due to shortness of breath 6/26: 998ft with one seated rest break at 1:40 seconds remaining due to SOB Goal status: IN PROGRESS  8  Patient will reduce modified Oswestry score to <20 as to demonstrate minimal disability with ADLs including improved sleeping tolerance, walking/sitting tolerance etc for better mobility with ADLs.  Baseline: 2/6: 60% 4/3: 50%  Goal status: In progress   9  Patient will improve df strength by 2lb for improved functional ambulation with decreased foot drag  Baseline: 5/29: L 4.2 R 6.9 Goal status: NEW PLAN:  PT FREQUENCY: 2x/week  PT DURATION: 8 weeks  PLANNED INTERVENTIONS: Therapeutic exercises, Therapeutic activity, Neuromuscular re-education, Balance training, Gait training, Patient/Family education, Self Care, Joint mobilization, Vestibular training, Canalith repositioning, Visual/preceptual remediation/compensation, Dry Needling,  Electrical stimulation, Spinal mobilization, Cryotherapy, Moist heat, Compression bandaging, scar mobilization, Splintting, Taping, Traction, Ultrasound, Biofeedback, Ionotophoresis 4mg /ml Dexamethasone , Manual therapy, and Re-evaluation  PLAN FOR NEXT SESSION:  Continue with LUE strengthening  L ankle strengthening, distraction, core activation, reassess scaption and flexion ROM, make HEP   Bennett Ram, SPT  This entire session was performed under direct supervision and direction of a licensed Estate agent . I have personally read, edited and approve of the note as written.  Marina  Leopoldo KLEIN ,DPT Physical Therapist- [facility]    07/02/24, 1:30 PM

## 2024-07-02 NOTE — Telephone Encounter (Signed)
-----   Message from [place] A sent at 07/02/2024  2:02 PM EDT ----- Regarding: CT Angio Can you please send over info needed to schedule Angio chest. S. Hammock in order for this patient. Thanks

## 2024-07-02 NOTE — Telephone Encounter (Signed)
 Does the patient have:  Hypertension: yes Diabetes: no - prediabetic Contrast allergy: no Recent labs: 05/09/2024 : MAG  2.4                                         BMP   glucose 107                                                   Creatinine 1.01        All other values normal Any medical devices (spinal stimulator, bladder stimulator, pain pump, pacemaker)? no History of Ovarian or Colon cancer or Lymphoma: no Abdominal Surgery in the past 6 weeks: no

## 2024-07-03 ENCOUNTER — Ambulatory Visit: Payer: Medicare HMO | Admitting: Psychology

## 2024-07-05 ENCOUNTER — Ambulatory Visit: Payer: Medicare HMO

## 2024-07-05 DIAGNOSIS — G5730 Lesion of lateral popliteal nerve, unspecified lower limb: Secondary | ICD-10-CM | POA: Diagnosis not present

## 2024-07-06 ENCOUNTER — Ambulatory Visit

## 2024-07-06 ENCOUNTER — Telehealth: Payer: Self-pay | Admitting: Cardiovascular Disease

## 2024-07-06 DIAGNOSIS — R278 Other lack of coordination: Secondary | ICD-10-CM

## 2024-07-06 DIAGNOSIS — M25612 Stiffness of left shoulder, not elsewhere classified: Secondary | ICD-10-CM | POA: Diagnosis not present

## 2024-07-06 DIAGNOSIS — M6281 Muscle weakness (generalized): Secondary | ICD-10-CM | POA: Diagnosis not present

## 2024-07-06 DIAGNOSIS — M5459 Other low back pain: Secondary | ICD-10-CM

## 2024-07-06 DIAGNOSIS — M79606 Pain in leg, unspecified: Secondary | ICD-10-CM | POA: Insufficient documentation

## 2024-07-06 DIAGNOSIS — G5730 Lesion of lateral popliteal nerve, unspecified lower limb: Secondary | ICD-10-CM | POA: Insufficient documentation

## 2024-07-06 DIAGNOSIS — R2689 Other abnormalities of gait and mobility: Secondary | ICD-10-CM

## 2024-07-06 DIAGNOSIS — R293 Abnormal posture: Secondary | ICD-10-CM | POA: Diagnosis not present

## 2024-07-06 NOTE — Telephone Encounter (Signed)
   Patient Name: Rachel Torres  DOB: 1956/12/07 MRN: [id number]  Primary Cardiologist: Evalene Lunger, MD  Chart reviewed as part of pre-operative protocol coverage. Patient is pending left peroneal nerve decompression on 07/18/24. As a part of preoperative evaluation, she was seen in office on 06/27/24 by Tylene Lunch, NP. Per office note, Preoperative cardiovascular examination which she required no further cardiovascular testing can proceed at acceptable risk.      [name]'s perioperative risk of a major cardiac event is 6.6% according to the Revised Cardiac Risk Index (RCRI).  Therefore, she is at high risk for perioperative complications.   Her functional capacity is fair at 5.07 METs according to the Duke Activity Status Index (DASI). Recommendations: According to ACC/AHA guidelines, no further cardiovascular testing needed.  The patient may proceed to surgery at acceptable risk.    Please below recommendations regarding Eliquis .  Procedure: left peroneal nerve decompression  Date of procedure: 07/18/24     CHA2DS2-VASc Score = 5  This indicates a 7.2% annual risk of stroke. The patient's score is based upon: CHF History: 1 HTN History: 1 Diabetes History: 0 Stroke History: 0 Vascular Disease History: 1 Age Score: 1 Gender Score: 1     CrCl 85 ml/min Platelet count 281K     Per office protocol, patient can hold Eliquis  for 2 days prior to procedure.    I will route this recommendation to the requesting party via Epic fax function and remove from pre-op pool.  Please call with questions.  Artist Pouch, PA-C 07/06/2024, 2:27 PM

## 2024-07-06 NOTE — Therapy (Signed)
 OUTPATIENT PHYSICAL THERAPY TREATMENT      Patient Name: Rachel Torres MRN: [id number] DOB:1956-08-12, 68 y.o., female Today's Date: 07/06/2024  END OF SESSION:                     Past Medical History:  Diagnosis Date   AAA (abdominal aortic aneurysm) (HCC)    a. Chronic w/o evidence of aneurysmal dil on CTA 01/2016.   Alcohol abuse    Allergy    Anemia    Anxiety    Asthma    CAD (coronary artery disease)    a. 08/2010 s/p CABG x 1 (VG->RCA) @ time of Ao dissection repair; b. 01/2016 Lexiscan  MV: mid antsept/apical defect w/ ? peri-infarct ischemia-->likely attenuation-->Med Rx.   Chewing difficulty    Chronic combined systolic (congestive) and diastolic (congestive) heart failure (HCC)    a. 2012 EF 30-35%; b. 04/2015 EF 40-45%; c. 01/2016 Echo: Ef 55-65%, nl AoV bioprosthesis, nl RV, nl PASP.   Constipation    Depression    Edema of both lower extremities    Gallbladder sludge    GERD (gastroesophageal reflux disease)    Heart failure (HCC)    Heart valve problem    History of Bicuspid Aortic Valve    a. 08/2010 s/p AVR @ time of Ao dissection repair; b. 01/2016 Echo: Ef 55-65%, nl AoV bioprosthesis, nl RV, nl PASP.   Hx of repair of dissecting thoracic aortic aneurysm, Stanford type A    a. 08/2010 s/p repair with AVR and VG->RCA; b. 01/2016 CTA: stable appearance of Asc Thoracic Aortic graft. Opacification of flase lumen of chronic abd Ao dissection w/ retrograde flow through lumbar arteries. No aneurysmal dil of flase lumen.   Hyperlipidemia    Hypertension    Hypertensive heart disease    IBS (irritable bowel syndrome)    Internal hemorrhoids    Kidney problem    Marfan syndrome    pt denies   Migraine    Obstructive sleep apnea    Osteoarthritis    Osteoporosis    Palpitations    Pneumonia    RA (rheumatoid arthritis) (HCC)    a. Followed by Dr. Balinda   Past Surgical History:  Procedure Laterality Date   ABDOMINAL AORTIC ANEURYSM REPAIR      CARDIAC CATHETERIZATION  2001   CARDIOVERSION N/A 04/04/2024   Procedure: CARDIOVERSION;  Surgeon: Perla Evalene PARAS, MD;  Location: [facility] ORS;  Service: Cardiovascular;  Laterality: N/A;   CESAREAN SECTION  1986 & 1991   CHOLECYSTECTOMY     COLONOSCOPY     COLONOSCOPY WITH PROPOFOL  N/A 01/19/2024   Procedure: COLONOSCOPY WITH PROPOFOL ;  Surgeon: Therisa Bi, MD;  Location: [facility];  Service: Gastroenterology;  Laterality: N/A;   ESOPHAGOGASTRODUODENOSCOPY (EGD) WITH PROPOFOL  N/A 05/08/2020   Procedure: ESOPHAGOGASTRODUODENOSCOPY (EGD) WITH PROPOFOL ;  Surgeon: Janalyn Keene NOVAK, MD;  Location: [facility] ENDOSCOPY;  Service: Endoscopy;  Laterality: N/A;   FRACTURE SURGERY Right    shoulder replacement   HEMORRHOID BANDING     ORIF DISTAL RADIUS FRACTURE Left    POLYPECTOMY  01/19/2024   Procedure: POLYPECTOMY;  Surgeon: Therisa Bi, MD;  Location: [facility] ENDOSCOPY;  Service: Gastroenterology;;   UPPER GASTROINTESTINAL ENDOSCOPY     Patient Active Problem List   Diagnosis Date Noted   Typical atrial flutter (HCC) 04/04/2024   Adenomatous polyp of colon 01/19/2024   Heart failure with improved ejection fraction (HFimpEF) (HCC) 01/18/2024   Abnormal metabolism 11/14/2023   Weight regain after  inital weight loss 11/14/2023   Multiple closed tarsal fractures of left foot, sequela 07/12/2023   Arm contusion, right, sequela 07/12/2023   Lumbar disc disease 07/12/2023   PAF (paroxysmal atrial fibrillation) (HCC) 06/09/2023   Paresthesia 06/03/2023   Obesity, Beginning BMI 35.45 02/17/2023   Tinnitus 01/14/2023   Change in vision 01/14/2023   Encounter for screening colonoscopy 01/14/2023   Personal history of fall 01/14/2023   Problem related to unspecified psychosocial circumstances 12/28/2022   Person encountering health services to consult on behalf of another person 12/28/2022   Palpitation 12/10/2022   Fatigue 10/26/2022   B12 deficiency 10/26/2022   Right shoulder pain  09/15/2022   Depression 09/14/2022   Other constipation 05/12/2022   Medicare annual wellness visit, initial 07/17/2021   TMJ (dislocation of temporomandibular joint) 07/17/2021   Encounter for screening mammogram for breast cancer 06/05/2021   Diarrhea 04/11/2020   Epigastric pain 02/19/2020   Dark stools 02/19/2020   History of atrial fibrillation 02/05/2019   SOB (shortness of breath) 01/27/2019   Irregular surface of cornea 12/25/2018   Acute cough 09/08/2017   History of HPV infection 12/01/2016   Screening mammogram, encounter for 11/29/2016   Estrogen deficiency 11/29/2016   Encounter for routine gynecological examination 11/29/2016   Grade III hemorrhoids 10/06/2016   Tachycardia 08/14/2016   Hemorrhoids 08/13/2016   Atypical migraine 08/03/2016   CAD (coronary artery disease)    AAA (abdominal aortic aneurysm) (HCC)    Chronic combined systolic (congestive) and diastolic (congestive) heart failure (HCC)    Prediabetes 02/11/2016   Generalized anxiety disorder 12/08/2015   Panic attacks 07/28/2015   Chest pain 07/09/2013   Sensorineural hearing loss 03/27/2013   Vitamin D  deficiency 07/03/2012   H/O aortic dissection 05/12/2012   Routine general medical examination at a health care facility 09/20/2011   Atypical chest pain 09/15/2011   S/P AVR (aortic valve replacement) 01/07/2011   CORONARY ARTERY BYPASS GRAFT, HX OF 01/07/2011   Arthritis 12/20/2010   Back pain 12/15/2010   H/O dissecting abdominal aortic aneurysm repair 08/24/2010   IRRITABLE BOWEL SYNDROME 09/09/2009   Obstructive sleep apnea 08/04/2009   External hemorrhoid 06/26/2009   Osteoporosis 01/09/2009   Hyperlipidemia 07/26/2007   Depression with anxiety 07/26/2007   Essential hypertension 07/26/2007   Allergic rhinitis 07/26/2007   Asthma 07/26/2007   GERD 07/26/2007   Osteoarthritis 07/26/2007   Ocular migraine 07/26/2007   Mild intermittent asthma without complication 07/26/2007    PCP:  Randeen Laine LABOR MD   REFERRING PROVIDER: Lamar Hamming  REFERRING DIAG: L rotator cuff   THERAPY DIAG:  Abnormal posture  Other abnormalities of gait and mobility  Other low back pain  Muscle weakness (generalized)  Other lack of coordination  Stiffness of left shoulder, not elsewhere classified  Rationale for Evaluation and Treatment: Rehabilitation  ONSET DATE: Feb 2024  SUBJECTIVE:  SUBJECTIVE STATEMENT:   Pt reports feeling very tired today.    Hand dominance: Left  PERTINENT HISTORY: Pieper Kasik is a 66yoF familiar to our services. Pt referred to PT for strengthening of chronic left shoulder DJD, known remote RC injury. Recently fell and broke four metatarsals, since cleared from use of CAM rocker. She was treated successfully in past for right shoulder pain and has past medical history of Right shoulder surgery replacement. 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 PMH aorta type A dissection in sept 2011 s/p aortic valve replacement, bypass of R coronary artery, anemia, anxiety, CAD, CHF, depression, GERD, Heart failure, HLD, HTN, IBS, migraine, osteoarthritis, osteoporosis, RA, back pain.    Back pain:  Patient has new referral for radiculopathy, lumbar region. MRI showed degeneration of L1-2 and L2-3 with some disc space collapse and minor retrolisthesis of L2-3. Issue began in February, felt numbness in L thigh. No cause known, but she was at a museum and noticed it was numb. Sometimes L foot numbness and toe numbness of 2nd and 3rd toe causing tripping. This caused her to trip July 4th and break five metatarsals. Some minor pain in the low pain noted. Pain noted to be 2-3/10 Pain in hip 3-4/10 of L hip.    PAIN:  Are you having pain? Yes, 3/10 pain in lateral deltoids in the  shoulder.  PRECAUTIONS: None  FALLS:  Has patient fallen in last 6 months? Yes. Number of falls 2  PATIENT GOALS:to lift more than 3lb with LUE, be able to eat a meal without arm being tired.    OBJECTIVE:  DIAGNOSTIC FINDINGS:  IMPRESSION: Mild degenerative change in the cervical spine without acute fracture or traumatic subluxation.   IMPRESSION: Full width, full width tears of the supraspinatus and infraspinatus tendons with retraction to the glenoid. Grade 2/3 infraspinatus atrophy.   Moderate tendinosis of the distal subscapularis tendon with articular sided fraying of the cephalad fibers.   Moderate intra-articular long head biceps tendinosis with fraying and possible partial tearing.   Mild glenohumeral and moderate AC joint osteoarthritis.   Degenerative superior labral tearing anteriorly and posteriorly.   PATIENT SURVEYS:  Quick Dash 50%  and FOTO 46%   COGNITION: Overall cognitive status: Within functional limits for tasks assessed                                  SENSATION: WFL   POSTURE: Slight rounded shoulders and shoulder elevation    UPPER EXTREMITY ROM:    Active ROM Right eval 9/16: recert RUE Left eval 9/16 RECERT LUE  Shoulder flexion 64 122 91 92  Shoulder extension        Shoulder abduction 95 88 52 82  Shoulder adduction        Shoulder internal rotation Unable to test at 90 61 degrees 5 40  Shoulder external rotation 18at side of body -5 degrees at 90 degrees 34 35  Elbow flexion [facility]  WFL   Elbow extension [facility]  WFL   Wrist flexion        Wrist extension        Wrist ulnar deviation        Wrist radial deviation        Wrist pronation        Wrist supination        (Blank rows = not tested)   UPPER EXTREMITY MMT:   MMT Right  eval Left eval  Shoulder flexion      Shoulder extension      Shoulder abduction      Shoulder adduction      Shoulder internal rotation      Shoulder external rotation      Middle trapezius       Lower trapezius      Elbow flexion      Elbow extension      Wrist flexion      Wrist extension      Wrist ulnar deviation      Wrist radial deviation      Wrist pronation      Wrist supination      Grip strength (lbs) 30 35  (Blank rows = not tested) Unable to test strength due to pain with motion   TRUNK ROM: Trunk Flexion 65  Trunk Extension 7  Trunk R SB Limited 25%  Trunk L SB WFL  Trunk R rotation WFL  Trunk L rotation Limited 25%    Ankle ROM Seated: Motion: AROM seated Right Left  Plantarflexion 4+ 3  Dorsiflexion 4+ 4  Inversion 4 3+  Eversion 4+ 3+    SHOULDER SPECIAL TESTS: Impingement tests:  unable to test due to pain in starting position SLAP lesions:  unable to test due to pain in starting position Instability tests:  unable to test due to pain in starting position Rotator cuff assessment:  unable to test due to pain in starting position Biceps assessment:  unable to test due to pain in starting position   LOW BACK SPECIAL TEST: SLR: negative  Scour test: negative FABER: negative Pelvic compression/distraction: negative  Slump test positive LLE  6 min walk test: 1160 ft increased left foot drag and antalgic gait pattern with foot eversion    JOINT MOBILITY TESTING:  AP: painful PA: painful Inferior: painful Distraction: reduces pain   Back mobilization hypomobile and painful to grade II mobilizations    PALPATION:  Tenderness to all joint regions of GH     TODAY'S TREATMENT: DATE: 07/06/24    TE- To improve strength, endurance, mobility, and function of specific targeted muscle groups or improve joint range of motion or improve muscle flexibility    Ankle:  Resistive Ankle DF/PF/IV/EV- 2 x 10 reps in longsit or seated- GTB   Supine: sciatic nerve glide 20x each LE ; 2 rounds- leg on SPT shoulder  figure four stretch 60 seconds each LE; 2 rounds Lower trunk rotations x15 Hip mobilization IR/ER (hookelye)  x15  Supine trA ab  contraction with leg lift- Hooklye- x 12 reps each Sidelye Thoracic Rotation (open/closed book) x 10 reps ea direction Sidelye clamshell x 15 reps ea side     NMR: To facilitate reeducation of movement, balance, posture, coordination, and/or proprioception/kinesthetic sense.  -Dynamic march on airex pad without UE Support x 20 reps -forward/backward steps over 1/2 foam x 15 ea LE (focusing on clearing toe)   PATIENT EDUCATION: Education details: goals, POC  Person educated: Patient Education method: Explanation, Demonstration, Tactile cues, Verbal cues, and Handouts Education comprehension: verbalized understanding, returned demonstration, verbal cues required, and tactile cues required  HOME EXERCISE PROGRAM: Access Code: [email or web address] URL: [email or web address] Date: 05/21/2024 Prepared by: Marina  Moser  Exercises - Hooklying Transversus Abdominis Palpation  - 1 x daily - 7 x weekly - 2 sets - 10 reps - 5 hold - Supine March  - 1 x daily - 7 x weekly - 2 sets - 10 reps -  5 hold - Seated Ankle Alphabet  - 1 x daily - 7 x weekly - 2 sets - 1 reps - 5 hold - Standing 'L' Stretch at Counter  - 1 x daily - 7 x weekly - 2 sets - 2 reps - 30 hold  Access Code: [email or web address] URL: [email or web address] Date: 10/03/2023 Prepared by: Marina  Moser  Exercises - Seated Scapular Retraction  - 1 x daily - 7 x weekly - 2 sets - 10 reps - 5 hold - Neck Sidebending  - 1 x daily - 7 x weekly - 2 sets - 2 reps - 30 hold - Supine Isometric Shoulder Extension with Towel  - 1 x daily - 7 x weekly - 2 sets - 10 reps - 5 hold - Supine Isometric Shoulder Adduction with Towel Roll  - 1 x daily - 7 x weekly - 2 sets - 10 reps - 5 hold - Seated Bilateral Shoulder Flexion Towel Slide at Table Top  - 1 x daily - 7 x weekly - 1 sets - 3 reps - 30 hold - Supine Posterior Pelvic Tilt  - 1 x daily - 7 x weekly - 2 sets - 5 reps - 30 hold - Supine LE Neural Mobilization  - 1 x daily - 7 x  weekly - 2 sets - 10 reps - 5 hold - Seated Ankle Alphabet  - 1 x daily - 7 x weekly - 2 sets - 1 reps - 5 hold  ASSESSMENT:  CLINICAL IMPRESSION:   Treatment continued to focus on left ankle strengthening/dynamic balance and core stabilization. She responded well to all interventions- no increased pain and able to progress with resistive ankle strengthening. She was later able to clear her foot over obstacle consistently with concentration.  Pt will continue to benefit from skilled therapy to address remaining deficits in order to improve overall QoL and return to PLOF.     OBJECTIVE IMPAIRMENTS: cardiopulmonary status limiting activity, decreased activity tolerance, decreased mobility, increased fascial restrictions, impaired perceived functional ability, increased muscle spasms, impaired flexibility, impaired UE functional use, improper body mechanics, postural dysfunction, and pain.   ACTIVITY LIMITATIONS: carrying, lifting, bed mobility, bathing, dressing, self feeding, reach over head, and caring for others  PARTICIPATION LIMITATIONS: meal prep, cleaning, laundry, personal finances, interpersonal relationship, driving, shopping, community activity, yard work, church, and caregiving for husband  PERSONAL FACTORS: Age, Fitness, Past/current experiences, Time since onset of injury/illness/exacerbation, and 3+ comorbidities: aorta type A dissection in sept 2011 with aortic valve replacement bypass of R coronary artery, AAA, anemia, anxiety, CAD, CHF, depression, GERD, Heart failure, HLD, HTN, IBS, Kidney problems, migraine, osteoarthritis, osteoporosis, RA, ocular migraines, back pain are also affecting patient's functional outcome.   REHAB POTENTIAL: Fair    CLINICAL DECISION MAKING: Evolving/moderate complexity  EVALUATION COMPLEXITY: Moderate   GOALS: Goals reviewed with patient? Yes  SHORT TERM GOALS: Target date: 04/19/2024    Patient will be independent in home exercise program  to improve strength/mobility for better functional independence with ADLs. Baseline:7/23: HEP given 9/16: compliant 1x/week 4/3: improving compliance 5/29: compliant Goal status: MET    LONG TERM GOALS: Target date: 07/12/2024      Patient will increase FOTO score to equal to or greater than  57%   to demonstrate statistically significant improvement in mobility and quality of life.  Baseline: 7/22: 46 9/16: 50% 10/7: 40% 12/10: 57% Goal status: MET  2.  Patient will improve shoulder AROM to > 140 degrees of flexion, scaption, and abduction  for improved ability to perform overhead activities. Baseline: 7/22: see above 9/16: see above 10/7 : defer to exacerbation 12/10: shoulder abduction >140, flexion < 140 , assess scaption next visit 2/6: flexion >140, abduction >140 scaption >140 Goal status: MET  3.  Patient will demonstrate adequate shoulder ROM and strength to be able to shave and dress independently with pain less than 3/10. Baseline: 7/23: 6/10 pain with dressing, limited ROM, unable to test strength 9/16: 3/10 pain with dressing  Goal status: MET  4.  Patient will decrease Quick DASH score by > 8 points demonstrating reduced self-reported upper extremity disability for L and R shoulder  Baseline: 7/23: L shoulder 50% 9/16: R=54.54 % L 66% L 81% R 24% 12/10: R: 43.2% L: 56.8% 2/6: L 22%  R 34%  Goal status: MET  5.  Patient will be able to eat an entire meal without resting arm demonstrating improved functional strength Baseline: 7/23: has to rest while eating due to pain. 9/16: able to make halfway through meal 10/7: can make it about halfway through meal, some meals can get 3/4s through meal 12/10: improved, still has to rest every once in a while 2/6: can get halfway to 2/3rds of the meal. 5/29:  2/3rds of meal  Goal status: deferred at this time to focus on back/leg  6.  Patient will be able to stand 15 minutes without her LLE going numb allowing for ADLS such as showering  and iADLs such as cooking Baseline: 5 minutes 12/10: will assess next visit 2/6: 10 minutes 4/3: 10 minutes 5/29: 5-8 minutes Goal status: IN PROGRESS  7. Patient will increase six minute walk test distance to >1500 for progression to community ambulator and improve gait ability Baseline: 1160 ft  12/10: 1010 ft 2/6: 1140 ft  4/3: 1201 ft 5/29: 774 ft stopped with 1 min 35 seconds remaining due to shortness of breath 6/26: 915ft with one seated rest break at 1:40 seconds remaining due to SOB Goal status: IN PROGRESS  8  Patient will reduce modified Oswestry score to <20 as to demonstrate minimal disability with ADLs including improved sleeping tolerance, walking/sitting tolerance etc for better mobility with ADLs.  Baseline: 2/6: 60% 4/3: 50%  Goal status: In progress   9  Patient will improve df strength by 2lb for improved functional ambulation with decreased foot drag  Baseline: 5/29: L 4.2 R 6.9 Goal status: NEW PLAN:  PT FREQUENCY: 2x/week  PT DURATION: 8 weeks  PLANNED INTERVENTIONS: Therapeutic exercises, Therapeutic activity, Neuromuscular re-education, Balance training, Gait training, Patient/Family education, Self Care, Joint mobilization, Vestibular training, Canalith repositioning, Visual/preceptual remediation/compensation, Dry Needling, Electrical stimulation, Spinal mobilization, Cryotherapy, Moist heat, Compression bandaging, scar mobilization, Splintting, Taping, Traction, Ultrasound, Biofeedback, Ionotophoresis 4mg /ml Dexamethasone , Manual therapy, and Re-evaluation  PLAN FOR NEXT SESSION:  Continue with LUE strengthening  L ankle strengthening, distraction, core activation, reassess scaption and flexion ROM, make HEP    Reyes LOISE London PT  Physical Therapist- [facility]  [facility]    07/06/24, 11:30 AM

## 2024-07-06 NOTE — Telephone Encounter (Signed)
 Patient with diagnosis of A Fib on Eliquis  for anticoagulation.  Cardioversion on 04/04/24.  Procedure: left peroneal nerve decompression  Date of procedure: 07/18/24   CHA2DS2-VASc Score = 5  This indicates a 7.2% annual risk of stroke. The patient's score is based upon: CHF History: 1 HTN History: 1 Diabetes History: 0 Stroke History: 0 Vascular Disease History: 1 Age Score: 1 Gender Score: 1    CrCl 85 ml/min Platelet count 281K   Per office protocol, patient can hold Eliquis  for 2 days prior to procedure.    **This guidance is not considered finalized until pre-operative APP has relayed final recommendations.**

## 2024-07-06 NOTE — Telephone Encounter (Signed)
   Pre-operative Risk Assessment    Patient Name: Rachel Torres  DOB: 1955-12-29 MRN: [id number]   Date of last office visit: unknown Date of next office visit: unknown   Request for Surgical Clearance    Procedure:  left peroneal nerve decompression  Date of Surgery:  Clearance 07/18/24                                Surgeon:  Victory LABOR. Pool Surgeon's Group or Practice Name:  [facility] Phone number:  [phone] Fax number:  [phone]    Type of Clearance Requested:   - Medical    Type of Anesthesia:  General    Additional requests/questions:    Signed, Berwyn LELON Sprung   07/06/2024, 11:41 AM

## 2024-07-08 NOTE — Progress Notes (Unsigned)
     Rachel Howdyshell T. Staphany Ditton, MD, CAQ Sports Medicine [facility] at [facility] [address] [place], [address]  Phone: [phone]  FAX: [phone]  Rachel Torres - 68 y.o. female  MRN [id number]  Date of Birth: 01-May-1956  Date: 07/09/2024  PCP: Rachel Laine LABOR, MD  Referral: Rachel Laine LABOR, MD  No chief complaint on file.  Subjective:   Rachel Torres is a 68 y.o. very pleasant female patient with There is no height or weight on file to calculate BMI. who presents with the following:  The patient presents with ongoing left-sided knee pain.  Review of Systems is noted in the HPI, as appropriate  Objective:   There were no vitals taken for this visit.  GEN: No acute distress; alert,appropriate. PULM: Breathing comfortably in no respiratory distress PSYCH: Normally interactive.   Laboratory and Imaging Data:  Assessment and Plan:   ***

## 2024-07-09 ENCOUNTER — Encounter: Payer: Self-pay | Admitting: Physical Therapy

## 2024-07-09 ENCOUNTER — Ambulatory Visit: Payer: Medicare HMO | Admitting: Physical Therapy

## 2024-07-09 ENCOUNTER — Ambulatory Visit
Admission: RE | Admit: 2024-07-09 | Discharge: 2024-07-09 | Disposition: A | Discharge status: Home / Self Care | Source: Ambulatory Visit | Attending: Family Medicine | Admitting: Family Medicine

## 2024-07-09 ENCOUNTER — Ambulatory Visit: Admitting: Family Medicine

## 2024-07-09 ENCOUNTER — Encounter: Payer: Self-pay | Admitting: Family Medicine

## 2024-07-09 VITALS — BP 110/70 | HR 75 | Temp 98.0°F | Ht 65.0 in | Wt 220.1 lb

## 2024-07-09 DIAGNOSIS — M6281 Muscle weakness (generalized): Secondary | ICD-10-CM

## 2024-07-09 DIAGNOSIS — R2689 Other abnormalities of gait and mobility: Secondary | ICD-10-CM

## 2024-07-09 DIAGNOSIS — M5459 Other low back pain: Secondary | ICD-10-CM

## 2024-07-09 DIAGNOSIS — M25562 Pain in left knee: Secondary | ICD-10-CM

## 2024-07-09 DIAGNOSIS — R278 Other lack of coordination: Secondary | ICD-10-CM | POA: Diagnosis not present

## 2024-07-09 DIAGNOSIS — M1712 Unilateral primary osteoarthritis, left knee: Secondary | ICD-10-CM

## 2024-07-09 DIAGNOSIS — R293 Abnormal posture: Secondary | ICD-10-CM

## 2024-07-09 DIAGNOSIS — M25462 Effusion, left knee: Secondary | ICD-10-CM | POA: Diagnosis not present

## 2024-07-09 DIAGNOSIS — M25612 Stiffness of left shoulder, not elsewhere classified: Secondary | ICD-10-CM | POA: Diagnosis not present

## 2024-07-09 MED ORDER — TRIAMCINOLONE ACETONIDE 40 MG/ML IJ SUSP
40.0000 mg | Freq: Once | INTRAMUSCULAR | Status: AC
  Administered 2024-07-09: 40 mg via INTRA_ARTICULAR

## 2024-07-09 NOTE — Therapy (Unsigned)
 OUTPATIENT PHYSICAL THERAPY TREATMENT      Patient Name: Rachel Torres MRN: [id number] DOB:01/29/1956, 68 y.o., female Today's Date: 07/09/2024  END OF SESSION:  PT End of Session - 07/09/24 1104     Visit Number 45    Number of Visits 52    Date for PT Re-Evaluation 07/12/24    Authorization Type [employer]  Medicare HMO    Authorization Time Period 01/26/24-03/22/24    PT Start Time 1105    PT Stop Time 1145    PT Time Calculation (min) 40 min    Equipment Utilized During Treatment Gait belt    Activity Tolerance Patient tolerated treatment well;No increased pain    Behavior During Therapy WFL for tasks assessed/performed                            Past Medical History:  Diagnosis Date   AAA (abdominal aortic aneurysm) (HCC)    a. Chronic w/o evidence of aneurysmal dil on CTA 01/2016.   Alcohol abuse    Allergy    Anemia    Anxiety    Asthma    CAD (coronary artery disease)    a. 08/2010 s/p CABG x 1 (VG->RCA) @ time of Ao dissection repair; b. 01/2016 Lexiscan  MV: mid antsept/apical defect w/ ? peri-infarct ischemia-->likely attenuation-->Med Rx.   Chewing difficulty    Chronic combined systolic (congestive) and diastolic (congestive) heart failure (HCC)    a. 2012 EF 30-35%; b. 04/2015 EF 40-45%; c. 01/2016 Echo: Ef 55-65%, nl AoV bioprosthesis, nl RV, nl PASP.   Constipation    Depression    Edema of both lower extremities    Gallbladder sludge    GERD (gastroesophageal reflux disease)    Heart failure (HCC)    Heart valve problem    History of Bicuspid Aortic Valve    a. 08/2010 s/p AVR @ time of Ao dissection repair; b. 01/2016 Echo: Ef 55-65%, nl AoV bioprosthesis, nl RV, nl PASP.   Hx of repair of dissecting thoracic aortic aneurysm, Stanford type A    a. 08/2010 s/p repair with AVR and VG->RCA; b. 01/2016 CTA: stable appearance of Asc Thoracic Aortic graft. Opacification of flase lumen of chronic abd Ao dissection w/ retrograde flow through lumbar  arteries. No aneurysmal dil of flase lumen.   Hyperlipidemia    Hypertension    Hypertensive heart disease    IBS (irritable bowel syndrome)    Internal hemorrhoids    Kidney problem    Marfan syndrome    pt denies   Migraine    Obstructive sleep apnea    Osteoarthritis    Osteoporosis    Palpitations    Pneumonia    RA (rheumatoid arthritis) (HCC)    a. Followed by Dr. Balinda   Past Surgical History:  Procedure Laterality Date   ABDOMINAL AORTIC ANEURYSM REPAIR     CARDIAC CATHETERIZATION  2001   CARDIOVERSION N/A 04/04/2024   Procedure: CARDIOVERSION;  Surgeon: Perla Evalene PARAS, MD;  Location: [facility] ORS;  Service: Cardiovascular;  Laterality: N/A;   CESAREAN SECTION  1986 & 1991   CHOLECYSTECTOMY     COLONOSCOPY     COLONOSCOPY WITH PROPOFOL  N/A 01/19/2024   Procedure: COLONOSCOPY WITH PROPOFOL ;  Surgeon: Therisa Bi, MD;  Location: [facility] ENDOSCOPY;  Service: Gastroenterology;  Laterality: N/A;   ESOPHAGOGASTRODUODENOSCOPY (EGD) WITH PROPOFOL  N/A 05/08/2020   Procedure: ESOPHAGOGASTRODUODENOSCOPY (EGD) WITH PROPOFOL ;  Surgeon: Janalyn Keene NOVAK, MD;  Location:  [facility] ENDOSCOPY;  Service: Endoscopy;  Laterality: N/A;   FRACTURE SURGERY Right    shoulder replacement   HEMORRHOID BANDING     ORIF DISTAL RADIUS FRACTURE Left    POLYPECTOMY  01/19/2024   Procedure: POLYPECTOMY;  Surgeon: Therisa Bi, MD;  Location: [facility] ENDOSCOPY;  Service: Gastroenterology;;   UPPER GASTROINTESTINAL ENDOSCOPY     Patient Active Problem List   Diagnosis Date Noted   Pain in lower limb 07/06/2024   Entrapment of common peroneal nerve 07/06/2024   Typical atrial flutter (HCC) 04/04/2024   Adenomatous polyp of colon 01/19/2024   Heart failure with improved ejection fraction (HFimpEF) (HCC) 01/18/2024   Abnormal metabolism 11/14/2023   Weight regain after inital weight loss 11/14/2023   Multiple closed tarsal fractures of left foot, sequela 07/12/2023   Arm contusion, right, sequela 07/12/2023    Lumbar disc disease 07/12/2023   Sprain of shoulder 06/30/2023   Closed fracture of fifth metatarsal bone of left foot 06/30/2023   PAF (paroxysmal atrial fibrillation) (HCC) 06/09/2023   Paresthesia 06/03/2023   Obesity, Beginning BMI 35.45 02/17/2023   Tinnitus 01/14/2023   Change in vision 01/14/2023   Encounter for screening colonoscopy 01/14/2023   Personal history of fall 01/14/2023   Problem related to unspecified psychosocial circumstances 12/28/2022   Person encountering health services to consult on behalf of another person 12/28/2022   Palpitation 12/10/2022   Fatigue 10/26/2022   B12 deficiency 10/26/2022   Right shoulder pain 09/15/2022   Depression 09/14/2022   Other constipation 05/12/2022   Medicare annual wellness visit, initial 07/17/2021   TMJ (dislocation of temporomandibular joint) 07/17/2021   Encounter for screening mammogram for breast cancer 06/05/2021   Diarrhea 04/11/2020   Epigastric pain 02/19/2020   Dark stools 02/19/2020   History of atrial fibrillation 02/05/2019   SOB (shortness of breath) 01/27/2019   Irregular surface of cornea 12/25/2018   Acute cough 09/08/2017   History of HPV infection 12/01/2016   Screening mammogram, encounter for 11/29/2016   Estrogen deficiency 11/29/2016   Encounter for routine gynecological examination 11/29/2016   Grade III hemorrhoids 10/06/2016   Tachycardia 08/14/2016   Hemorrhoids 08/13/2016   Atypical migraine 08/03/2016   CAD (coronary artery disease)    AAA (abdominal aortic aneurysm) (HCC)    Chronic combined systolic (congestive) and diastolic (congestive) heart failure (HCC)    Prediabetes 02/11/2016   Generalized anxiety disorder 12/08/2015   Panic attacks 07/28/2015   Chest pain 07/09/2013   Sensorineural hearing loss 03/27/2013   Vitamin D  deficiency 07/03/2012   H/O aortic dissection 05/12/2012   Routine general medical examination at a health care facility 09/20/2011   Atypical chest pain  09/15/2011   S/P AVR (aortic valve replacement) 01/07/2011   CORONARY ARTERY BYPASS GRAFT, HX OF 01/07/2011   Arthritis 12/20/2010   Back pain 12/15/2010   H/O dissecting abdominal aortic aneurysm repair 08/24/2010   IRRITABLE BOWEL SYNDROME 09/09/2009   Obstructive sleep apnea 08/04/2009   External hemorrhoid 06/26/2009   Osteoporosis 01/09/2009   Hyperlipidemia 07/26/2007   Depression with anxiety 07/26/2007   Essential hypertension 07/26/2007   Allergic rhinitis 07/26/2007   Asthma 07/26/2007   GERD 07/26/2007   Osteoarthritis 07/26/2007   Ocular migraine 07/26/2007   Mild intermittent asthma without complication 07/26/2007    PCP: Randeen Laine LABOR MD   REFERRING PROVIDER: Lamar Hamming  REFERRING DIAG: L rotator cuff   THERAPY DIAG:  Abnormal posture  Other abnormalities of gait and mobility  Other low back pain  Muscle weakness (generalized)  Other lack of coordination  Rationale for Evaluation and Treatment: Rehabilitation  ONSET DATE: Feb 2024  SUBJECTIVE:                                                                                                                                                                                      SUBJECTIVE STATEMENT:   Pt reports that she is feeling good today. Pt states that she is seeing the ortho this afternoon for her L knee. Pt is set to have surgery on the 7/30 for L foot drop.   Hand dominance: Left  PERTINENT HISTORY: Marianny Goris is a 66yoF familiar to our services. Pt referred to PT for strengthening of chronic left shoulder DJD, known remote RC injury. Recently fell and broke four metatarsals, since cleared from use of CAM rocker. She was treated successfully in past for right shoulder pain and has past medical history of Right shoulder surgery replacement. 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 PMH aorta type A dissection in sept 2011 s/p aortic valve replacement, bypass of R coronary artery, anemia,  anxiety, CAD, CHF, depression, GERD, Heart failure, HLD, HTN, IBS, migraine, osteoarthritis, osteoporosis, RA, back pain.    Back pain:  Patient has new referral for radiculopathy, lumbar region. MRI showed degeneration of L1-2 and L2-3 with some disc space collapse and minor retrolisthesis of L2-3. Issue began in February, felt numbness in L thigh. No cause known, but she was at a museum and noticed it was numb. Sometimes L foot numbness and toe numbness of 2nd and 3rd toe causing tripping. This caused her to trip July 4th and break five metatarsals. Some minor pain in the low pain noted. Pain noted to be 2-3/10 Pain in hip 3-4/10 of L hip.    PAIN:  Are you having pain? Yes, 3/10 pain in lateral deltoids in the shoulder.  PRECAUTIONS: None  FALLS:  Has patient fallen in last 6 months? Yes. Number of falls 2  PATIENT GOALS:to lift more than 3lb with LUE, be able to eat a meal without arm being tired.    OBJECTIVE:  DIAGNOSTIC FINDINGS:  IMPRESSION: Mild degenerative change in the cervical spine without acute fracture or traumatic subluxation.   IMPRESSION: Full width, full width tears of the supraspinatus and infraspinatus tendons with retraction to the glenoid. Grade 2/3 infraspinatus atrophy.   Moderate tendinosis of the distal subscapularis tendon with articular sided fraying of the cephalad fibers.   Moderate intra-articular long head biceps tendinosis with fraying and possible partial tearing.   Mild glenohumeral and moderate AC joint osteoarthritis.   Degenerative superior labral tearing anteriorly and posteriorly.   PATIENT  SURVEYS:  Quick Dash 50%  and FOTO 46%   COGNITION: Overall cognitive status: Within functional limits for tasks assessed                                  SENSATION: WFL   POSTURE: Slight rounded shoulders and shoulder elevation    UPPER EXTREMITY ROM:    Active ROM Right eval 9/16: recert RUE Left eval 9/16 RECERT LUE  Shoulder  flexion 64 122 91 92  Shoulder extension        Shoulder abduction 95 88 52 82  Shoulder adduction        Shoulder internal rotation Unable to test at 90 61 degrees 5 40  Shoulder external rotation 18at side of body -5 degrees at 90 degrees 34 35  Elbow flexion [facility]  WFL   Elbow extension [facility]  WFL   Wrist flexion        Wrist extension        Wrist ulnar deviation        Wrist radial deviation        Wrist pronation        Wrist supination        (Blank rows = not tested)   UPPER EXTREMITY MMT:   MMT Right eval Left eval  Shoulder flexion      Shoulder extension      Shoulder abduction      Shoulder adduction      Shoulder internal rotation      Shoulder external rotation      Middle trapezius      Lower trapezius      Elbow flexion      Elbow extension      Wrist flexion      Wrist extension      Wrist ulnar deviation      Wrist radial deviation      Wrist pronation      Wrist supination      Grip strength (lbs) 30 35  (Blank rows = not tested) Unable to test strength due to pain with motion   TRUNK ROM: Trunk Flexion 65  Trunk Extension 7  Trunk R SB Limited 25%  Trunk L SB WFL  Trunk R rotation WFL  Trunk L rotation Limited 25%    Ankle ROM Seated: Motion: AROM seated Right Left  Plantarflexion 4+ 3  Dorsiflexion 4+ 4  Inversion 4 3+  Eversion 4+ 3+    SHOULDER SPECIAL TESTS: Impingement tests:  unable to test due to pain in starting position SLAP lesions:  unable to test due to pain in starting position Instability tests:  unable to test due to pain in starting position Rotator cuff assessment:  unable to test due to pain in starting position Biceps assessment:  unable to test due to pain in starting position   LOW BACK SPECIAL TEST: SLR: negative  Scour test: negative FABER: negative Pelvic compression/distraction: negative  Slump test positive LLE  6 min walk test: 1160 ft increased left foot drag and antalgic gait pattern with foot eversion     JOINT MOBILITY TESTING:  AP: painful PA: painful Inferior: painful Distraction: reduces pain   Back mobilization hypomobile and painful to grade II mobilizations    PALPATION:  Tenderness to all joint regions of GH     TODAY'S TREATMENT: DATE: 07/09/24    TE- To improve strength, endurance, mobility, and function of specific  targeted muscle groups or improve joint range of motion or improve muscle flexibility   Supine: sciatic nerve glide 20x each LE ; 2 rounds- leg on SPT shoulder  figure four stretch 60 seconds each LE; 2 rounds LE rotations x15 Sidelying thoracic rotations x15 each direction  Seated: Swiss ball roll-outs x multiple reps  LAQ with 3# AW x15 each LE Marches with 3# AW x15 each LE    NMR: To facilitate reeducation of movement, balance, posture, coordination, and/or proprioception/kinesthetic sense. BAPS Board: - PF/DF x15  - INV/EV x15 each - circles x15 each direction - Static hold x 2 minutes  Airex balance circuit- 45 seconds each: - static stance - toe taps on side of treadmill - marches - SLS; 2 rounds each side- cues to prevent knee hyperextension in stance    PATIENT EDUCATION: Education details: goals, POC  Person educated: Patient Education method: Explanation, Demonstration, Tactile cues, Verbal cues, and Handouts Education comprehension: verbalized understanding, returned demonstration, verbal cues required, and tactile cues required  HOME EXERCISE PROGRAM: Access Code: [email or web address] URL: [email or web address] Date: 05/21/2024 Prepared by: Marina  Moser  Exercises - Hooklying Transversus Abdominis Palpation  - 1 x daily - 7 x weekly - 2 sets - 10 reps - 5 hold - Supine March  - 1 x daily - 7 x weekly - 2 sets - 10 reps - 5 hold - Seated Ankle Alphabet  - 1 x daily - 7 x weekly - 2 sets - 1 reps - 5 hold - Standing 'L' Stretch at Counter  - 1 x daily - 7 x weekly - 2 sets - 2 reps - 30 hold  Access Code:  [email or web address] URL: [email or web address] Date: 10/03/2023 Prepared by: Marina  Moser  Exercises - Seated Scapular Retraction  - 1 x daily - 7 x weekly - 2 sets - 10 reps - 5 hold - Neck Sidebending  - 1 x daily - 7 x weekly - 2 sets - 2 reps - 30 hold - Supine Isometric Shoulder Extension with Towel  - 1 x daily - 7 x weekly - 2 sets - 10 reps - 5 hold - Supine Isometric Shoulder Adduction with Towel Roll  - 1 x daily - 7 x weekly - 2 sets - 10 reps - 5 hold - Seated Bilateral Shoulder Flexion Towel Slide at Table Top  - 1 x daily - 7 x weekly - 1 sets - 3 reps - 30 hold - Supine Posterior Pelvic Tilt  - 1 x daily - 7 x weekly - 2 sets - 5 reps - 30 hold - Supine LE Neural Mobilization  - 1 x daily - 7 x weekly - 2 sets - 10 reps - 5 hold - Seated Ankle Alphabet  - 1 x daily - 7 x weekly - 2 sets - 1 reps - 5 hold  ASSESSMENT:  CLINICAL IMPRESSION:   Pt arrived with high motivation to participate in PT today. Session focused on dynamic balance training and LE strengthening ahead of upcoming orthopedic appointments and surgery next week. Pt tolerated all interventions well today with minimal increase in knee pain throughout session. Pt will continue to benefit from skilled therapy to address remaining deficits in order to improve overall QoL and return to PLOF.     OBJECTIVE IMPAIRMENTS: cardiopulmonary status limiting activity, decreased activity tolerance, decreased mobility, increased fascial restrictions, impaired perceived functional ability, increased muscle spasms, impaired flexibility, impaired UE functional use, improper body mechanics, postural dysfunction, and pain.   ACTIVITY LIMITATIONS:  carrying, lifting, bed mobility, bathing, dressing, self feeding, reach over head, and caring for others  PARTICIPATION LIMITATIONS: meal prep, cleaning, laundry, personal finances, interpersonal relationship, driving, shopping, community activity, yard work, church, and caregiving for  husband  PERSONAL FACTORS: Age, Fitness, Past/current experiences, Time since onset of injury/illness/exacerbation, and 3+ comorbidities: aorta type A dissection in sept 2011 with aortic valve replacement bypass of R coronary artery, AAA, anemia, anxiety, CAD, CHF, depression, GERD, Heart failure, HLD, HTN, IBS, Kidney problems, migraine, osteoarthritis, osteoporosis, RA, ocular migraines, back pain are also affecting patient's functional outcome.   REHAB POTENTIAL: Fair    CLINICAL DECISION MAKING: Evolving/moderate complexity  EVALUATION COMPLEXITY: Moderate   GOALS: Goals reviewed with patient? Yes  SHORT TERM GOALS: Target date: 04/19/2024    Patient will be independent in home exercise program to improve strength/mobility for better functional independence with ADLs. Baseline:7/23: HEP given 9/16: compliant 1x/week 4/3: improving compliance 5/29: compliant Goal status: MET    LONG TERM GOALS: Target date: 07/12/2024      Patient will increase FOTO score to equal to or greater than  57%   to demonstrate statistically significant improvement in mobility and quality of life.  Baseline: 7/22: 46 9/16: 50% 10/7: 40% 12/10: 57% Goal status: MET  2.  Patient will improve shoulder AROM to > 140 degrees of flexion, scaption, and abduction for improved ability to perform overhead activities. Baseline: 7/22: see above 9/16: see above 10/7 : defer to exacerbation 12/10: shoulder abduction >140, flexion < 140 , assess scaption next visit 2/6: flexion >140, abduction >140 scaption >140 Goal status: MET  3.  Patient will demonstrate adequate shoulder ROM and strength to be able to shave and dress independently with pain less than 3/10. Baseline: 7/23: 6/10 pain with dressing, limited ROM, unable to test strength 9/16: 3/10 pain with dressing  Goal status: MET  4.  Patient will decrease Quick DASH score by > 8 points demonstrating reduced self-reported upper extremity disability for L and  R shoulder  Baseline: 7/23: L shoulder 50% 9/16: R=54.54 % L 66% L 81% R 24% 12/10: R: 43.2% L: 56.8% 2/6: L 22%  R 34%  Goal status: MET  5.  Patient will be able to eat an entire meal without resting arm demonstrating improved functional strength Baseline: 7/23: has to rest while eating due to pain. 9/16: able to make halfway through meal 10/7: can make it about halfway through meal, some meals can get 3/4s through meal 12/10: improved, still has to rest every once in a while 2/6: can get halfway to 2/3rds of the meal. 5/29:  2/3rds of meal  Goal status: deferred at this time to focus on back/leg  6.  Patient will be able to stand 15 minutes without her LLE going numb allowing for ADLS such as showering and iADLs such as cooking Baseline: 5 minutes 12/10: will assess next visit 2/6: 10 minutes 4/3: 10 minutes 5/29: 5-8 minutes Goal status: IN PROGRESS  7. Patient will increase six minute walk test distance to >1500 for progression to community ambulator and improve gait ability Baseline: 1160 ft  12/10: 1010 ft 2/6: 1140 ft  4/3: 1201 ft 5/29: 774 ft stopped with 1 min 35 seconds remaining due to shortness of breath 6/26: 974ft with one seated rest break at 1:40 seconds remaining due to SOB Goal status: IN PROGRESS  8  Patient will reduce modified Oswestry score to <20 as to demonstrate minimal disability with ADLs including improved sleeping tolerance, walking/sitting  tolerance etc for better mobility with ADLs.  Baseline: 2/6: 60% 4/3: 50%  Goal status: In progress   9  Patient will improve df strength by 2lb for improved functional ambulation with decreased foot drag  Baseline: 5/29: L 4.2 R 6.9 Goal status: NEW PLAN:  PT FREQUENCY: 2x/week  PT DURATION: 8 weeks  PLANNED INTERVENTIONS: Therapeutic exercises, Therapeutic activity, Neuromuscular re-education, Balance training, Gait training, Patient/Family education, Self Care, Joint mobilization, Vestibular training, Canalith  repositioning, Visual/preceptual remediation/compensation, Dry Needling, Electrical stimulation, Spinal mobilization, Cryotherapy, Moist heat, Compression bandaging, scar mobilization, Splintting, Taping, Traction, Ultrasound, Biofeedback, Ionotophoresis 4mg /ml Dexamethasone , Manual therapy, and Re-evaluation  PLAN FOR NEXT SESSION:  Continue with LUE strengthening  L ankle strengthening, distraction, core activation, reassess scaption and flexion ROM, make HEP   Kendrick Remigio, SPT  This entire session was performed under direct supervision and direction of a licensed Estate agent . I have personally read, edited and approve of the note as written.  Connell CHRISTELLA Kiss PT ,DPT Physical Therapist- [facility]    07/09/24, 11:52 AM

## 2024-07-10 ENCOUNTER — Ambulatory Visit (INDEPENDENT_AMBULATORY_CARE_PROVIDER_SITE_OTHER): Payer: Medicare HMO | Admitting: Psychology

## 2024-07-10 ENCOUNTER — Other Ambulatory Visit: Payer: Self-pay | Admitting: Neurosurgery

## 2024-07-10 ENCOUNTER — Ambulatory Visit
Admission: RE | Admit: 2024-07-10 | Discharge: 2024-07-10 | Disposition: A | Discharge status: Home / Self Care | Source: Ambulatory Visit | Attending: Cardiology | Admitting: Cardiology

## 2024-07-10 ENCOUNTER — Encounter: Payer: Self-pay | Admitting: Family Medicine

## 2024-07-10 DIAGNOSIS — F331 Major depressive disorder, recurrent, moderate: Secondary | ICD-10-CM | POA: Diagnosis not present

## 2024-07-10 DIAGNOSIS — I7770 Dissection of unspecified artery: Secondary | ICD-10-CM | POA: Insufficient documentation

## 2024-07-10 MED ORDER — IOHEXOL 350 MG/ML SOLN
100.0000 mL | Freq: Once | INTRAVENOUS | Status: AC | PRN
  Administered 2024-07-10: 100 mL via INTRAVENOUS

## 2024-07-10 NOTE — Progress Notes (Signed)
 [facility] Counselor/Therapist Progress Note  Patient ID: Rachel Torres, MRN: [id number],    Date: 07/10/2024  Time Spent: 10:00am-10:55am    Treatment Type: Individual Therapy  Reported Symptoms: stress, overwhelm  Mental Status Exam: Appearance:  Casual     Behavior: Appropriate  Motor: Normal  Speech/Language:  Normal Rate  Affect: Appropriate  Mood: normal  Thought process: normal  Thought content:   WNL  Sensory/Perceptual disturbances:   WNL  Orientation: oriented to person, place, time/date, and situation  Attention: Good  Concentration: Good  Memory: WNL  Fund of knowledge:  Good  Insight:   Good  Judgment:  Good  Impulse Control: Good   Risk Assessment: Danger to Self:  No Self-injurious Behavior: No Danger to Others: No Duty to Warn:no Physical Aggression / Violence:No  Access to Firearms a concern: No  Gang Involvement:No   Subjective: Pt present for face-to-face individual therapy via video.  Pt consents to telehealth video session and is aware of limitations and benefits of virtual sessions.   Location of pt: home Location of therapist: home office.   Pt talked about her health.  She has a day surgery procedure on her nerve at her knee next week.  Pt will need additional care giving help for Rachel Torres [facility]   Pt is having trouble sleeping bc she is having trouble quieting her thoughts.  Worked on sleep and calming strategies. Pt talked about her husband Rachel Torres.  Pt gets overwhelmed with care giving for Rachel Torres.  Addressed the stress of caregiving for Rachel Torres.   Pt gets frustrated with Rachel Torres's cognitive challenges.  Addressed how this impacts pt.  Pt worries about how Rachel Torres's cognitive decline will progress.  Worked on [employer], dispensing.  Worked on self care strategies. Provided supportive therapy.    Interventions: Cognitive Behavioral Therapy and Insight-Oriented  Diagnosis:  F33.1   Plan of Care:  Recommend ongoing therapy.   Pt participated in setting treatment goals.   Pt needs a place to talk about stressors.  Plan to meet every week.  Pt agrees with treatment plan.    Treatment Plan (target date: 03/27/2025) Client Abilities/Strengths  Pt is bright, engaging, and motivated for therapy.   Client Treatment Preferences  Individual therapy.  Client Statement of Needs  Improve coping skills.  Have a place to talk about stressors.   Symptoms  Depressed or irritable mood. Unresolved grief issues.  Problems Addressed  Unipolar Depression Goals 1. Alleviate depressive symptoms and return to previous level of effective functioning. 2. Appropriately grieve the loss in order to normalize mood and to return to previously adaptive level of functioning. Objective Learn and implement behavioral strategies to overcome depression. Target Date: 2025-03-27 Frequency: weekly  Progress: 45 Modality: individual  Related Interventions Engage the client in behavioral activation, increasing his/her activity level and contact with sources of reward, while identifying processes that inhibit activation.  Use behavioral techniques such as instruction, rehearsal, role-playing, role reversal, as needed, to facilitate activity in the client's daily life; reinforce success. Assist the client in developing skills that increase the likelihood of deriving pleasure from behavioral activation (e.g., assertiveness skills, developing an exercise plan, less internal/more external focus, increased social involvement); reinforce success. Objective Identify important people in life, past and present, and describe the quality, good and poor, of those relationships. Target Date: 2025-03-27 Frequency: weekly  Progress: 45 Modality: individual  Related Interventions Conduct Interpersonal Therapy beginning with the assessment of the client's interpersonal inventory  of important past and present relationships; develop a  case formulation linking depression to grief, interpersonal role disputes, role transitions, and/or interpersonal deficits). Objective Learn and implement problem-solving and decision-making skills. Target Date: 2025-03-27 Frequency: weekly  Progress: 45 Modality: individual  Related Interventions Conduct Problem-Solving Therapy using techniques such as psychoeducation, modeling, and role-playing to teach client problem-solving skills (i.e., defining a problem specifically, generating possible solutions, evaluating the pros and cons of each solution, selecting and implementing a plan of action, evaluating the efficacy of the plan, accepting or revising the plan); role-play application of the problem-solving skill to a real life issue. Encourage in the client the development of a positive problem orientation in which problems and solving them are viewed as a natural part of life and not something to be feared, despaired, or avoided. 3. Develop healthy interpersonal relationships that lead to the alleviation and help prevent the relapse of depression. 4. Develop healthy thinking patterns and beliefs about self, others, and the world that lead to the alleviation and help prevent the relapse of depression. 5. Recognize, accept, and cope with feelings of depression. Diagnosis F33.1  Conditions For Discharge Achievement of treatment goals and objectives   Veva Alma, LCSW

## 2024-07-12 ENCOUNTER — Ambulatory Visit: Payer: Medicare HMO

## 2024-07-12 DIAGNOSIS — R293 Abnormal posture: Secondary | ICD-10-CM | POA: Diagnosis not present

## 2024-07-12 DIAGNOSIS — M5459 Other low back pain: Secondary | ICD-10-CM | POA: Diagnosis not present

## 2024-07-12 DIAGNOSIS — R2689 Other abnormalities of gait and mobility: Secondary | ICD-10-CM

## 2024-07-12 DIAGNOSIS — M25612 Stiffness of left shoulder, not elsewhere classified: Secondary | ICD-10-CM | POA: Diagnosis not present

## 2024-07-12 DIAGNOSIS — R278 Other lack of coordination: Secondary | ICD-10-CM | POA: Diagnosis not present

## 2024-07-12 DIAGNOSIS — M6281 Muscle weakness (generalized): Secondary | ICD-10-CM | POA: Diagnosis not present

## 2024-07-12 NOTE — Therapy (Signed)
 OUTPATIENT PHYSICAL THERAPY TREATMENT/ RE-CERT      Patient Name: Rachel Torres MRN: [id number] DOB:1956-03-22, 68 y.o., female Today's Date: 07/12/2024  END OF SESSION:  PT End of Session - 07/12/24 1148     Visit Number 46    Number of Visits 64    Date for PT Re-Evaluation 09/06/24    Authorization Type [employer]  Medicare HMO    Authorization Time Period 01/26/24-03/22/24    PT Start Time 1148    PT Stop Time 1226    PT Time Calculation (min) 38 min    Equipment Utilized During Treatment Gait belt    Activity Tolerance Patient tolerated treatment well;No increased pain    Behavior During Therapy WFL for tasks assessed/performed                             Past Medical History:  Diagnosis Date   AAA (abdominal aortic aneurysm) (HCC)    a. Chronic w/o evidence of aneurysmal dil on CTA 01/2016.   Alcohol abuse    Allergy    Anemia    Anxiety    Asthma    CAD (coronary artery disease)    a. 08/2010 s/p CABG x 1 (VG->RCA) @ time of Ao dissection repair; b. 01/2016 Lexiscan  MV: mid antsept/apical defect w/ ? peri-infarct ischemia-->likely attenuation-->Med Rx.   Chewing difficulty    Chronic combined systolic (congestive) and diastolic (congestive) heart failure (HCC)    a. 2012 EF 30-35%; b. 04/2015 EF 40-45%; c. 01/2016 Echo: Ef 55-65%, nl AoV bioprosthesis, nl RV, nl PASP.   Constipation    Depression    Edema of both lower extremities    Gallbladder sludge    GERD (gastroesophageal reflux disease)    Heart failure (HCC)    Heart valve problem    History of Bicuspid Aortic Valve    a. 08/2010 s/p AVR @ time of Ao dissection repair; b. 01/2016 Echo: Ef 55-65%, nl AoV bioprosthesis, nl RV, nl PASP.   Hx of repair of dissecting thoracic aortic aneurysm, Stanford type A    a. 08/2010 s/p repair with AVR and VG->RCA; b. 01/2016 CTA: stable appearance of Asc Thoracic Aortic graft. Opacification of flase lumen of chronic abd Ao dissection w/ retrograde flow  through lumbar arteries. No aneurysmal dil of flase lumen.   Hyperlipidemia    Hypertension    Hypertensive heart disease    IBS (irritable bowel syndrome)    Internal hemorrhoids    Kidney problem    Marfan syndrome    pt denies   Migraine    Obstructive sleep apnea    Osteoarthritis    Osteoporosis    Palpitations    Pneumonia    RA (rheumatoid arthritis) (HCC)    a. Followed by Dr. Balinda   Past Surgical History:  Procedure Laterality Date   ABDOMINAL AORTIC ANEURYSM REPAIR     CARDIAC CATHETERIZATION  2001   CARDIOVERSION N/A 04/04/2024   Procedure: CARDIOVERSION;  Surgeon: Perla Evalene PARAS, MD;  Location: [facility] ORS;  Service: Cardiovascular;  Laterality: N/A;   CESAREAN SECTION  1986 & 1991   CHOLECYSTECTOMY     COLONOSCOPY     COLONOSCOPY WITH PROPOFOL  N/A 01/19/2024   Procedure: COLONOSCOPY WITH PROPOFOL ;  Surgeon: Therisa Bi, MD;  Location: [facility] ENDOSCOPY;  Service: Gastroenterology;  Laterality: N/A;   ESOPHAGOGASTRODUODENOSCOPY (EGD) WITH PROPOFOL  N/A 05/08/2020   Procedure: ESOPHAGOGASTRODUODENOSCOPY (EGD) WITH PROPOFOL ;  Surgeon: Janalyn Keene NOVAK, MD;  Location: [facility] ENDOSCOPY;  Service: Endoscopy;  Laterality: N/A;   FRACTURE SURGERY Right    shoulder replacement   HEMORRHOID BANDING     ORIF DISTAL RADIUS FRACTURE Left    POLYPECTOMY  01/19/2024   Procedure: POLYPECTOMY;  Surgeon: Therisa Bi, MD;  Location: [facility] ENDOSCOPY;  Service: Gastroenterology;;   UPPER GASTROINTESTINAL ENDOSCOPY     Patient Active Problem List   Diagnosis Date Noted   Pain in lower limb 07/06/2024   Entrapment of common peroneal nerve 07/06/2024   Typical atrial flutter (HCC) 04/04/2024   Adenomatous polyp of colon 01/19/2024   Heart failure with improved ejection fraction (HFimpEF) (HCC) 01/18/2024   Abnormal metabolism 11/14/2023   Weight regain after inital weight loss 11/14/2023   Multiple closed tarsal fractures of left foot, sequela 07/12/2023   Arm contusion, right,  sequela 07/12/2023   Lumbar disc disease 07/12/2023   Sprain of shoulder 06/30/2023   Closed fracture of fifth metatarsal bone of left foot 06/30/2023   PAF (paroxysmal atrial fibrillation) (HCC) 06/09/2023   Paresthesia 06/03/2023   Obesity, Beginning BMI 35.45 02/17/2023   Tinnitus 01/14/2023   Change in vision 01/14/2023   Encounter for screening colonoscopy 01/14/2023   Personal history of fall 01/14/2023   Problem related to unspecified psychosocial circumstances 12/28/2022   Person encountering health services to consult on behalf of another person 12/28/2022   Palpitation 12/10/2022   Fatigue 10/26/2022   B12 deficiency 10/26/2022   Right shoulder pain 09/15/2022   Depression 09/14/2022   Other constipation 05/12/2022   Medicare annual wellness visit, initial 07/17/2021   TMJ (dislocation of temporomandibular joint) 07/17/2021   Encounter for screening mammogram for breast cancer 06/05/2021   Diarrhea 04/11/2020   Epigastric pain 02/19/2020   Dark stools 02/19/2020   History of atrial fibrillation 02/05/2019   SOB (shortness of breath) 01/27/2019   Irregular surface of cornea 12/25/2018   Acute cough 09/08/2017   History of HPV infection 12/01/2016   Screening mammogram, encounter for 11/29/2016   Estrogen deficiency 11/29/2016   Encounter for routine gynecological examination 11/29/2016   Grade III hemorrhoids 10/06/2016   Tachycardia 08/14/2016   Hemorrhoids 08/13/2016   Atypical migraine 08/03/2016   CAD (coronary artery disease)    AAA (abdominal aortic aneurysm) (HCC)    Chronic combined systolic (congestive) and diastolic (congestive) heart failure (HCC)    Prediabetes 02/11/2016   Generalized anxiety disorder 12/08/2015   Panic attacks 07/28/2015   Chest pain 07/09/2013   Sensorineural hearing loss 03/27/2013   Vitamin D  deficiency 07/03/2012   H/O aortic dissection 05/12/2012   Routine general medical examination at a health care facility 09/20/2011    Atypical chest pain 09/15/2011   S/P AVR (aortic valve replacement) 01/07/2011   CORONARY ARTERY BYPASS GRAFT, HX OF 01/07/2011   Arthritis 12/20/2010   Back pain 12/15/2010   H/O dissecting abdominal aortic aneurysm repair 08/24/2010   IRRITABLE BOWEL SYNDROME 09/09/2009   Obstructive sleep apnea 08/04/2009   External hemorrhoid 06/26/2009   Osteoporosis 01/09/2009   Hyperlipidemia 07/26/2007   Depression with anxiety 07/26/2007   Essential hypertension 07/26/2007   Allergic rhinitis 07/26/2007   Asthma 07/26/2007   GERD 07/26/2007   Osteoarthritis 07/26/2007   Ocular migraine 07/26/2007   Mild intermittent asthma without complication 07/26/2007    PCP: Randeen Laine LABOR MD   REFERRING PROVIDER: Lamar Hamming  REFERRING DIAG: L rotator cuff   THERAPY DIAG:  Other abnormalities of gait and mobility  Other low back pain  Muscle weakness (  generalized)  Other lack of coordination  Rationale for Evaluation and Treatment: Rehabilitation  ONSET DATE: Feb 2024  SUBJECTIVE:                                                                                                                                                                                      SUBJECTIVE STATEMENT:   Pt reports that she was able to walk the sky walk at the [place] yesterday without having to stop. Pt received a cortisone injection in her L knee on Tuesday, and reports that her knee feels a lot better.   Hand dominance: Left  PERTINENT HISTORY: Karole Oo is a 66yoF familiar to our services. Pt referred to PT for strengthening of chronic left shoulder DJD, known remote RC injury. Recently fell and broke four metatarsals, since cleared from use of CAM rocker. She was treated successfully in past for right shoulder pain and has past medical history of Right shoulder surgery replacement. 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 PMH aorta type A dissection in sept 2011 s/p aortic valve replacement,  bypass of R coronary artery, anemia, anxiety, CAD, CHF, depression, GERD, Heart failure, HLD, HTN, IBS, migraine, osteoarthritis, osteoporosis, RA, back pain.    Back pain:  Patient has new referral for radiculopathy, lumbar region. MRI showed degeneration of L1-2 and L2-3 with some disc space collapse and minor retrolisthesis of L2-3. Issue began in February, felt numbness in L thigh. No cause known, but she was at a museum and noticed it was numb. Sometimes L foot numbness and toe numbness of 2nd and 3rd toe causing tripping. This caused her to trip July 4th and break five metatarsals. Some minor pain in the low pain noted. Pain noted to be 2-3/10 Pain in hip 3-4/10 of L hip.    PAIN:  Are you having pain? Yes, 3/10 pain in lateral deltoids in the shoulder.  PRECAUTIONS: None  FALLS:  Has patient fallen in last 6 months? Yes. Number of falls 2  PATIENT GOALS:to lift more than 3lb with LUE, be able to eat a meal without arm being tired.    OBJECTIVE:  DIAGNOSTIC FINDINGS:  IMPRESSION: Mild degenerative change in the cervical spine without acute fracture or traumatic subluxation.   IMPRESSION: Full width, full width tears of the supraspinatus and infraspinatus tendons with retraction to the glenoid. Grade 2/3 infraspinatus atrophy.   Moderate tendinosis of the distal subscapularis tendon with articular sided fraying of the cephalad fibers.   Moderate intra-articular long head biceps tendinosis with fraying and possible partial tearing.   Mild glenohumeral and moderate AC joint osteoarthritis.   Degenerative superior labral tearing anteriorly and posteriorly.  PATIENT SURVEYS:  Quick Dash 50%  and FOTO 46%   COGNITION: Overall cognitive status: Within functional limits for tasks assessed                                  SENSATION: WFL   POSTURE: Slight rounded shoulders and shoulder elevation    UPPER EXTREMITY ROM:    Active ROM Right eval 9/16: recert RUE  Left eval 9/16 RECERT LUE  Shoulder flexion 64 122 91 92  Shoulder extension        Shoulder abduction 95 88 52 82  Shoulder adduction        Shoulder internal rotation Unable to test at 90 61 degrees 5 40  Shoulder external rotation 18at side of body -5 degrees at 90 degrees 34 35  Elbow flexion [facility]  WFL   Elbow extension [facility]  WFL   Wrist flexion        Wrist extension        Wrist ulnar deviation        Wrist radial deviation        Wrist pronation        Wrist supination        (Blank rows = not tested)   UPPER EXTREMITY MMT:   MMT Right eval Left eval  Shoulder flexion      Shoulder extension      Shoulder abduction      Shoulder adduction      Shoulder internal rotation      Shoulder external rotation      Middle trapezius      Lower trapezius      Elbow flexion      Elbow extension      Wrist flexion      Wrist extension      Wrist ulnar deviation      Wrist radial deviation      Wrist pronation      Wrist supination      Grip strength (lbs) 30 35  (Blank rows = not tested) Unable to test strength due to pain with motion   TRUNK ROM: Trunk Flexion 65  Trunk Extension 7  Trunk R SB Limited 25%  Trunk L SB WFL  Trunk R rotation WFL  Trunk L rotation Limited 25%    Ankle ROM Seated: Motion: AROM seated Right Left  Plantarflexion 4+ 3  Dorsiflexion 4+ 4  Inversion 4 3+  Eversion 4+ 3+    SHOULDER SPECIAL TESTS: Impingement tests:  unable to test due to pain in starting position SLAP lesions:  unable to test due to pain in starting position Instability tests:  unable to test due to pain in starting position Rotator cuff assessment:  unable to test due to pain in starting position Biceps assessment:  unable to test due to pain in starting position   LOW BACK SPECIAL TEST: SLR: negative  Scour test: negative FABER: negative Pelvic compression/distraction: negative  Slump test positive LLE  6 min walk test: 1160 ft increased left foot drag and  antalgic gait pattern with foot eversion    JOINT MOBILITY TESTING:  AP: painful PA: painful Inferior: painful Distraction: reduces pain   Back mobilization hypomobile and painful to grade II mobilizations    PALPATION:  Tenderness to all joint regions of GH     TODAY'S TREATMENT: DATE: 07/12/24    TE- To improve strength, endurance, mobility, and function of  specific targeted muscle groups or improve joint range of motion or improve muscle flexibility   Supine: sciatic nerve glide 20x each LE ; 2 rounds- leg on SPT shoulder  figure four stretch 60 seconds each LE; 2 rounds  Seated: Swiss ball roll-outs x multiple reps     NMR: To facilitate reeducation of movement, balance, posture, coordination, and/or proprioception/kinesthetic sense. BAPS Board: - PF/DF x15  - INV/EV x15 each - circles x15 each direction - Static hold x 2 minutes   Physical Performance Measures: 6 Min Walk Test:  Instructed patient to ambulate as quickly and as safely as possible for 6 minutes using LRAD. Patient was allowed to take standing rest breaks without stopping the test, but if the patient required a sitting rest break the clock would be stopped and the test would be over.  Results: 1110 feet (338.328 meters) using no AD with CGA. Results indicate that the patient has reduced endurance with ambulation compared to age matched norms.  Age Matched Norms: 84-69 yo M: 69 F: 43, 33-79 yo M: 64 F: 471, 24-89 yo M: 417 F: 392 MDC: 58.21 meters (190.98 feet) or 50 meters (ANPTA Core Set of Outcome Measures for Adults with Neurologic Conditions, 2018) Standing rest break provided after and vitals assessed: SpO2 97%, Pulse rate 88  PATIENT EDUCATION: Education details: goals, POC  Person educated: Patient Education method: Explanation, Demonstration, Tactile cues, Verbal cues, and Handouts Education comprehension: verbalized understanding, returned demonstration, verbal cues required, and  tactile cues required  HOME EXERCISE PROGRAM: Access Code: [email or web address] URL: [email or web address] Date: 05/21/2024 Prepared by: Marina  Moser  Exercises - Hooklying Transversus Abdominis Palpation  - 1 x daily - 7 x weekly - 2 sets - 10 reps - 5 hold - Supine March  - 1 x daily - 7 x weekly - 2 sets - 10 reps - 5 hold - Seated Ankle Alphabet  - 1 x daily - 7 x weekly - 2 sets - 1 reps - 5 hold - Standing 'L' Stretch at Counter  - 1 x daily - 7 x weekly - 2 sets - 2 reps - 30 hold  Access Code: [email or web address] URL: [email or web address] Date: 10/03/2023 Prepared by: Marina  Moser  Exercises - Seated Scapular Retraction  - 1 x daily - 7 x weekly - 2 sets - 10 reps - 5 hold - Neck Sidebending  - 1 x daily - 7 x weekly - 2 sets - 2 reps - 30 hold - Supine Isometric Shoulder Extension with Towel  - 1 x daily - 7 x weekly - 2 sets - 10 reps - 5 hold - Supine Isometric Shoulder Adduction with Towel Roll  - 1 x daily - 7 x weekly - 2 sets - 10 reps - 5 hold - Seated Bilateral Shoulder Flexion Towel Slide at Table Top  - 1 x daily - 7 x weekly - 1 sets - 3 reps - 30 hold - Supine Posterior Pelvic Tilt  - 1 x daily - 7 x weekly - 2 sets - 5 reps - 30 hold - Supine LE Neural Mobilization  - 1 x daily - 7 x weekly - 2 sets - 10 reps - 5 hold - Seated Ankle Alphabet  - 1 x daily - 7 x weekly - 2 sets - 1 reps - 5 hold  ASSESSMENT:  CLINICAL IMPRESSION:   Pt arrived with high motivation to participate in PT activities today. Pt was able to increase her distance on today and  did not require a standing rest break, showing improvements in her endurance and overall tolerance for ambulation. Pt has peroneal nerve decompression surgery on 07/18/24- pt informed that she will require a new order for additional PT sessions after surgery. Pt will continue to benefit from skilled therapy to address remaining deficits in order to improve overall QoL and return to PLOF.     OBJECTIVE  IMPAIRMENTS: cardiopulmonary status limiting activity, decreased activity tolerance, decreased mobility, increased fascial restrictions, impaired perceived functional ability, increased muscle spasms, impaired flexibility, impaired UE functional use, improper body mechanics, postural dysfunction, and pain.   ACTIVITY LIMITATIONS: carrying, lifting, bed mobility, bathing, dressing, self feeding, reach over head, and caring for others  PARTICIPATION LIMITATIONS: meal prep, cleaning, laundry, personal finances, interpersonal relationship, driving, shopping, community activity, yard work, church, and caregiving for husband  PERSONAL FACTORS: Age, Fitness, Past/current experiences, Time since onset of injury/illness/exacerbation, and 3+ comorbidities: aorta type A dissection in sept 2011 with aortic valve replacement bypass of R coronary artery, AAA, anemia, anxiety, CAD, CHF, depression, GERD, Heart failure, HLD, HTN, IBS, Kidney problems, migraine, osteoarthritis, osteoporosis, RA, ocular migraines, back pain are also affecting patient's functional outcome.   REHAB POTENTIAL: Fair    CLINICAL DECISION MAKING: Evolving/moderate complexity  EVALUATION COMPLEXITY: Moderate   GOALS: Goals reviewed with patient? Yes  SHORT TERM GOALS: Target date: 04/19/2024    Patient will be independent in home exercise program to improve strength/mobility for better functional independence with ADLs. Baseline:7/23: HEP given 9/16: compliant 1x/week 4/3: improving compliance 5/29: compliant Goal status: MET    LONG TERM GOALS: Target date: 09/06/24      Patient will increase FOTO score to equal to or greater than  57%   to demonstrate statistically significant improvement in mobility and quality of life.  Baseline: 7/22: 46 9/16: 50% 10/7: 40% 12/10: 57% Goal status: MET  2.  Patient will improve shoulder AROM to > 140 degrees of flexion, scaption, and abduction for improved ability to perform overhead  activities. Baseline: 7/22: see above 9/16: see above 10/7 : defer to exacerbation 12/10: shoulder abduction >140, flexion < 140 , assess scaption next visit 2/6: flexion >140, abduction >140 scaption >140 Goal status: MET  3.  Patient will demonstrate adequate shoulder ROM and strength to be able to shave and dress independently with pain less than 3/10. Baseline: 7/23: 6/10 pain with dressing, limited ROM, unable to test strength 9/16: 3/10 pain with dressing  Goal status: MET  4.  Patient will decrease Quick DASH score by > 8 points demonstrating reduced self-reported upper extremity disability for L and R shoulder  Baseline: 7/23: L shoulder 50% 9/16: R=54.54 % L 66% L 81% R 24% 12/10: R: 43.2% L: 56.8% 2/6: L 22%  R 34%  Goal status: MET  5.  Patient will be able to eat an entire meal without resting arm demonstrating improved functional strength Baseline: 7/23: has to rest while eating due to pain. 9/16: able to make halfway through meal 10/7: can make it about halfway through meal, some meals can get 3/4s through meal 12/10: improved, still has to rest every once in a while 2/6: can get halfway to 2/3rds of the meal. 5/29:  2/3rds of meal  Goal status: deferred at this time to focus on back/leg  6.  Patient will be able to stand 15 minutes without her LLE going numb allowing for ADLS such as showering and iADLs such as cooking Baseline: 5 minutes 12/10: will assess next visit  2/6: 10 minutes 4/3: 10 minutes 5/29: 5-8 minutes  7/24: 5 minutes- pt states that it is now in both of her legs Goal status: IN PROGRESS  7. Patient will increase six minute walk test distance to >1500 for progression to community ambulator and improve gait ability Baseline: 1160 ft  12/10: 1010 ft 2/6: 1140 ft  4/3: 1201 ft 5/29: 774 ft stopped with 1 min 35 seconds remaining due to shortness of breath 6/26: 933ft with one seated rest break at 1:40 seconds remaining due to SOB 7/24: 1110 ft- pt able to walk the  entire 6 minutes without a break Goal status: IN PROGRESS  8  Patient will reduce modified Oswestry score to <20 as to demonstrate minimal disability with ADLs including improved sleeping tolerance, walking/sitting tolerance etc for better mobility with ADLs.  Baseline: 2/6: 60% 4/3: 50%  7/24: 22/50 = 44% Goal status: In progress   9  Patient will improve df strength by 2lb for improved functional ambulation with decreased foot drag  Baseline: 5/29: L 4.2 R 6.9 7/24: L 4-/5 R 4+/5 Goal status: NEW PLAN:  PT FREQUENCY: 2x/week  PT DURATION: 8 weeks  PLANNED INTERVENTIONS: Therapeutic exercises, Therapeutic activity, Neuromuscular re-education, Balance training, Gait training, Patient/Family education, Self Care, Joint mobilization, Vestibular training, Canalith repositioning, Visual/preceptual remediation/compensation, Dry Needling, Electrical stimulation, Spinal mobilization, Cryotherapy, Moist heat, Compression bandaging, scar mobilization, Splintting, Taping, Traction, Ultrasound, Biofeedback, Ionotophoresis 4mg /ml Dexamethasone , Manual therapy, and Re-evaluation  PLAN FOR NEXT SESSION:  Continue with LUE strengthening  L ankle strengthening, distraction, core activation, reassess scaption and flexion ROM, make HEP   Antasia Haider, SPT  This entire session was performed under direct supervision and direction of a licensed Estate agent . I have personally read, edited and approve of the note as written.  Marina  Leopoldo KLEIN ,DPT Physical Therapist- [facility]    07/12/24, 2:24 PM

## 2024-07-12 NOTE — Progress Notes (Signed)
 Surgical Instructions   Your procedure is scheduled on July 18, 2024. Report to [facility] Main Entrance A at 6:00 A.M., then check in with the Admitting office. Any questions or running late day of surgery: call [phone]  Questions prior to your surgery date: call [phone], Monday-Friday, 8am-4pm. If you experience any cold or flu symptoms such as cough, fever, chills, shortness of breath, etc. between now and your scheduled surgery, please notify us  at the above number.     Remember:  Do not eat or drink after midnight the night before your surgery    Take these medicines the morning of surgery with A SIP OF WATER  carvedilol  (COREG )  ezetimibe  (ZETIA )  venlafaxine  XR (EFFEXOR -XR)  isosorbide  mononitrate (IMDUR )  omeprazole  (PRILOSEC)   May take these medicines IF NEEDED: acetaminophen  (TYLENOL )  albuterol  (VENTOLIN  HFA) inhaler MAY BRING WITH YOU ALPRAZolam  (XANAX )  cetirizine  (ZYRTEC )  cyclobenzaprine  (FLEXERIL )  fluticasone  (FLONASE )  nitroGLYCERIN  (NITROSTAT )  PLEASE CALL [phone] IF TAKEN BEFORE YOUR PROCEDURE promethazine  (PHENERGAN )   dapagliflozin  propanediol (FARXIGA ) LAST DOSE 07-14-24 ELIQUIS  LAST DOSE 07-15-24  One week prior to surgery, STOP taking any Aspirin  (unless otherwise instructed by your surgeon) Aleve, Naproxen, Ibuprofen, Motrin, Advil, Goody's, BC's, all herbal medications, fish oil, and non-prescription vitamins.                     Do NOT Smoke (Tobacco/Vaping) for 24 hours prior to your procedure.  If you use a CPAP at night, you may bring your mask/headgear for your overnight stay.   You will be asked to remove any contacts, glasses, piercing's, hearing aid's, dentures/partials prior to surgery. Please bring cases for these items if needed.    Patients discharged the day of surgery will not be allowed to drive home, and someone needs to stay with them for 24 hours.  SURGICAL WAITING ROOM VISITATION Patients may have no more  than 2 support people in the waiting area - these visitors may rotate.   Pre-op nurse will coordinate an appropriate time for 1 ADULT support person, who may not rotate, to accompany patient in pre-op.  Children under the age of 30 must have an adult with them who is not the patient and must remain in the main waiting area with an adult.  If the patient needs to stay at the hospital during part of their recovery, the visitor guidelines for inpatient rooms apply.  Please refer to the [facility] website for the visitor guidelines for any additional information.   If you received a COVID test during your pre-op visit  it is requested that you wear a mask when out in public, stay away from anyone that may not be feeling well and notify your surgeon if you develop symptoms. If you have been in contact with anyone that has tested positive in the last 10 days please notify you surgeon.      Pre-operative CHG Bathing Instructions   You can play a key role in reducing the risk of infection after surgery. Your skin needs to be as free of germs as possible. You can reduce the number of germs on your skin by washing with CHG (chlorhexidine gluconate) soap before surgery. CHG is an antiseptic soap that kills germs and continues to kill germs even after washing.   DO NOT use if you have an allergy to chlorhexidine/CHG or antibacterial soaps. If your skin becomes reddened or irritated, stop using the CHG and notify one of our RNs at [phone].  TAKE A SHOWER THE NIGHT BEFORE SURGERY AND THE DAY OF SURGERY    Please keep in mind the following:  DO NOT shave, including legs and underarms, 48 hours prior to surgery.   You may shave your face before/day of surgery.  Place clean sheets on your bed the night before surgery Use a clean washcloth (not used since being washed) for each shower. DO NOT sleep with pet's night before surgery.  CHG Shower Instructions:  Wash your face and private  area with normal soap. If you choose to wash your hair, wash first with your normal shampoo.  After you use shampoo/soap, rinse your hair and body thoroughly to remove shampoo/soap residue.  Turn the water OFF and apply half the bottle of CHG soap to a CLEAN washcloth.  Apply CHG soap ONLY FROM YOUR NECK DOWN TO YOUR TOES (washing for 3-5 minutes)  DO NOT use CHG soap on face, private areas, open wounds, or sores.  Pay special attention to the area where your surgery is being performed.  If you are having back surgery, having someone wash your back for you may be helpful. Wait 2 minutes after CHG soap is applied, then you may rinse off the CHG soap.  Pat dry with a clean towel  Put on clean pajamas    Additional instructions for the day of surgery: DO NOT APPLY any lotions, deodorants, cologne, or perfumes.   Do not wear jewelry or makeup Do not wear nail polish, gel polish, artificial nails, or any other type of covering on natural nails (fingers and toes) Do not bring valuables to the hospital. [facility] is not responsible for valuables/personal belongings. Put on clean/comfortable clothes.  Please brush your teeth.  Ask your nurse before applying any prescription medications to the skin.

## 2024-07-13 ENCOUNTER — Other Ambulatory Visit: Payer: Self-pay

## 2024-07-13 ENCOUNTER — Encounter (HOSPITAL_COMMUNITY)
Admission: RE | Admit: 2024-07-13 | Discharge: 2024-07-13 | Disposition: A | Discharge status: Home / Self Care | Source: Ambulatory Visit | Attending: Neurosurgery | Admitting: Neurosurgery

## 2024-07-13 ENCOUNTER — Encounter (HOSPITAL_COMMUNITY): Payer: Self-pay

## 2024-07-13 VITALS — BP 119/66 | HR 80 | Temp 97.7°F | Resp 16 | Ht 65.0 in | Wt 215.6 lb

## 2024-07-13 DIAGNOSIS — M069 Rheumatoid arthritis, unspecified: Secondary | ICD-10-CM | POA: Diagnosis not present

## 2024-07-13 DIAGNOSIS — Z01812 Encounter for preprocedural laboratory examination: Secondary | ICD-10-CM | POA: Diagnosis not present

## 2024-07-13 DIAGNOSIS — I5042 Chronic combined systolic (congestive) and diastolic (congestive) heart failure: Secondary | ICD-10-CM | POA: Diagnosis not present

## 2024-07-13 DIAGNOSIS — Z7982 Long term (current) use of aspirin: Secondary | ICD-10-CM | POA: Diagnosis not present

## 2024-07-13 DIAGNOSIS — G5730 Lesion of lateral popliteal nerve, unspecified lower limb: Secondary | ICD-10-CM | POA: Insufficient documentation

## 2024-07-13 DIAGNOSIS — E785 Hyperlipidemia, unspecified: Secondary | ICD-10-CM | POA: Diagnosis not present

## 2024-07-13 DIAGNOSIS — Z952 Presence of prosthetic heart valve: Secondary | ICD-10-CM | POA: Insufficient documentation

## 2024-07-13 DIAGNOSIS — Z87891 Personal history of nicotine dependence: Secondary | ICD-10-CM | POA: Diagnosis not present

## 2024-07-13 DIAGNOSIS — J45909 Unspecified asthma, uncomplicated: Secondary | ICD-10-CM | POA: Insufficient documentation

## 2024-07-13 DIAGNOSIS — G4733 Obstructive sleep apnea (adult) (pediatric): Secondary | ICD-10-CM | POA: Diagnosis not present

## 2024-07-13 DIAGNOSIS — K589 Irritable bowel syndrome without diarrhea: Secondary | ICD-10-CM | POA: Diagnosis not present

## 2024-07-13 DIAGNOSIS — Z9889 Other specified postprocedural states: Secondary | ICD-10-CM | POA: Insufficient documentation

## 2024-07-13 DIAGNOSIS — Z01818 Encounter for other preprocedural examination: Secondary | ICD-10-CM

## 2024-07-13 DIAGNOSIS — I110 Hypertensive heart disease with heart failure: Secondary | ICD-10-CM | POA: Diagnosis not present

## 2024-07-13 DIAGNOSIS — I7102 Dissection of abdominal aorta: Secondary | ICD-10-CM | POA: Diagnosis not present

## 2024-07-13 HISTORY — DX: Unspecified atrial fibrillation: I48.91

## 2024-07-13 LAB — BASIC METABOLIC PANEL WITH GFR
Anion gap: 9 (ref 5–15)
BUN: 25 mg/dL — ABNORMAL HIGH (ref 8–23)
CO2: 25 mmol/L (ref 22–32)
Calcium: 9.2 mg/dL (ref 8.9–10.3)
Chloride: 103 mmol/L (ref 98–111)
Creatinine, Ser: 0.95 mg/dL (ref 0.44–1.00)
GFR, Estimated: >60 mL/min (ref >60)
Glucose, Bld: 104 mg/dL — ABNORMAL HIGH (ref 70–99)
Potassium: 4 mmol/L (ref 3.5–5.1)
Sodium: 137 mmol/L (ref 135–145)

## 2024-07-13 LAB — CBC
HCT: 44.6 % (ref 36.0–46.0)
Hemoglobin: 14.8 g/dL (ref 12.0–15.0)
MCH: 31.3 pg (ref 26.0–34.0)
MCHC: 33.2 g/dL (ref 30.0–36.0)
MCV: 94.3 fL (ref 80.0–100.0)
Platelets: 247 K/uL (ref 150–400)
RBC: 4.73 MIL/uL (ref 3.87–5.11)
RDW: 14.4 % (ref 11.5–15.5)
WBC: 9.6 K/uL (ref 4.0–10.5)
nRBC: 0 % (ref 0.0–0.2)

## 2024-07-13 NOTE — Progress Notes (Signed)
 PCP - Tower, Laine LABOR, MD  Cardiologist - Evalene Lunger, MD   PPM/ICD - denies Device Orders - n/a Rep Notified - n/a  Chest x-ray - 01-30-24 EKG - 06-27-24 Stress Test - 11-30-22 ECHO - 05-04-24 Cardiac Cath - 2001  Sleep Study - 08-04-2019 CPAP -   DM -denies  Blood Thinner Instructions:Eliquis  last dose 07-15-24 Aspirin  Instructions:last dose 07-12-24 per patient   Farxiga  last dose 07-14-24  ERAS Protcol - NPO   COVID TEST- n/a   Anesthesia review: Yes, HX of CAD,HTN, OSA  Patient denies shortness of breath, fever, cough and chest pain at PAT appointment   All instructions explained to the patient, with a verbal understanding of the material. Patient agrees to go over the instructions while at home for a better understanding. Patient also instructed to self quarantine after being tested for COVID-19. The opportunity to ask questions was provided.

## 2024-07-16 ENCOUNTER — Ambulatory Visit

## 2024-07-16 ENCOUNTER — Ambulatory Visit: Payer: Self-pay | Admitting: Cardiology

## 2024-07-16 ENCOUNTER — Encounter

## 2024-07-16 NOTE — Progress Notes (Addendum)
 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  History includes former smoker (quit 1980), HTN, HLD, bicuspid aortic valve w/ aortic dissection (aorta type A dissection in September 2011 who was rushed to [facility] and underwent aortic valve replacement, bypass of the right coronary artery with venous donor site from her right thigh, aortic grafting for a extensive dissection of the ascending aorta, residual descending aortic dissection extending into the iliac artery), CAD (SVG-RCA at the time of aortic dissection repair 08/2010), chronic combined systolic and diastolic CHF, palpitations, SVT (s/p RF ablation 1999), afib (s/p DCCV 03/17/23, last 04/04/24) , LE edema, AAA (3.2 cm  % 1.5 cm LCIA 07/10/24), RA, OSA, anemia, IBS, asthma. The is a notation indicating she denied history of Marfan syndrome.  Current alcohol use is documented as only 2 to 3 glasses of red wine per month.  Preoperative cardiology input outlined on 07/06/24 by Rachel Birmingham, PA-C:  Patient is pending left peroneal nerve decompression on 07/18/24. As a part of preoperative evaluation, she was seen in office on 06/27/24 by Rachel Lunch, NP. Per office note, Preoperative cardiovascular examination which she required no further cardiovascular testing can proceed at acceptable risk.      [name]'s perioperative risk of a major cardiac event is 6.6% according to the Revised Cardiac Risk Index (RCRI).  Therefore, she is at high risk for perioperative complications.   Her functional capacity is fair  at 5.07 METs according to the Duke Activity Status Index (DASI). Recommendations: According to ACC/AHA guidelines, no further cardiovascular testing needed.  The patient may proceed to surgery at acceptable risk...  CHA2DS2-VASc Score = 5.  Per their office protocol, she was given permission to hold Eliquis  for 2 days prior to procedure. Of note, she was considering ablation for PAF, but currently cancelled--if she becomes more symptomatic them may reconsider.  Last Eliquis  documented as 07/15/2024.  Last aspirin  documented as 07/12/2024.  Anesthesia team to evaluate on the day of surgery.   VS: BP 119/66   Pulse 80   Temp 36.5 C   Resp 16   Ht 5' 5 (1.651 m)   Wt 97.8 kg   SpO2 97%   BMI 35.88 kg/m   PROVIDERS: Tower, Laine LABOR, MD is PCP  Rachel Lye, MD is primary cardiologist Rachel Agent, MD is rheumatologist Rachel Norris, MD is vascular surgeon [facility])  LABS: Labs reviewed: Acceptable for surgery. (all labs ordered are listed, but only abnormal results are displayed)  Labs Reviewed  BASIC METABOLIC PANEL WITH GFR - Abnormal; Notable for the following components:      Result Value   Glucose, Bld 104 (*)    BUN 25 (*)    All other components within normal limits  CBC   A1c 6.3% 01/18/24.    IMAGES: CTA Chest/abd/pelvis 07/10/2024: IMPRESSION: 1. Stable postsurgical changes of the ascending aorta. No evidence for thoracic aortic dissection. 2. Stable abdominal aortic dissection and aneurysm. Dissection extends into the left common iliac artery. Aneurysmal dilatation of the left common iliac artery measures up to 1.5 cm, unchanged.  3. No acute localizing process in the chest, abdomen or pelvis. 4. Stable 3 mm right lower lobe pulmonary nodule. No follow-up needed if patient is low-risk.This recommendation follows the consensus statement: Guidelines for Management of Incidental Pulmonary Nodules Detected on CT Images: From the Fleischner Society 2017;  Radiology 2017; 284:228-243. 5. Moderate compression deformity of T11 has progressed compared to 2024. - Aortic Atherosclerosis (ICD10-I70.0).    EKG: 06/27/24: Sinus rhythm with 1st degree A-V block Incomplete left bundle branch block Nonspecific ST and T wave abnormality When compared with ECG of 30-May-2024 10:25, No acute changes Confirmed by Rachel Torres ([id number]) on 06/27/2024 3:05:18 PM   CV: TTE 05/04/24: IMPRESSIONS   1. Left ventricular ejection fraction, by estimation, is 50 to 55%. The  left ventricle has low normal function. The left ventricle has no regional  wall motion abnormalities. Left ventricular diastolic parameters were  normal.   2. Right ventricular systolic function is low normal. The right  ventricular size is normal.   3. Right atrial size was mildly dilated.   4. The mitral valve is normal in structure. No evidence of mitral valve  regurgitation.   5. The aortic valve was not well visualized. Aortic valve regurgitation  is not visualized.    Long-term monitor 12/24/2022:   The patient was monitored for 5 days, 3 hours.   The predominant rhythm was sinus with an average rate of 90 bpm (range 62-126 bpm in sinus).   There were rare PACs and occasional PVCs (PVC burden 1-2%).   There were at least 10 supraventricular runs, lasting up to 15 seconds with a maximum rate of 188 bpm.   There were also 19 episodes of wide-complex tachycardia labeled as nonsustained ventricular tachycardia lasting up to 15 beats with a maximum rate of 188 bpm.  Review of morphologies just that some/all of these episodes may actually represent SVT with aberrancy.   No sustained arrhythmia or prolonged pause was observed.   Patient triggered events correspond to sinus rhythm, PACs, and PVCs.   Predominantly sinus rhythm with rare PACs and occasional PVCs.  Several episodes of brief supraventricular tachycardia were noted.  There were also several episodes of brief wide-complex  tachycardia, favored to represent SVT with aberrancy over NSVT.  No evidence of atrial fibrillation.    NM myocardial w spect, 09/30/2022 Pharmacological myocardial perfusion imaging study with no significant  ischemia Normal wall motion, EF estimated at 45% (significant GI uptake artifact noted) No EKG changes concerning for ischemia at peak stress or in recovery. Low risk scan  PET Myocardial Study 09/04/12 [facility]): Impression:  - No evidence for significant ischemia or scar is noted  - Global systolic function is low normal. The EF calculated at 52%.   Past Medical History:  Diagnosis Date   AAA (abdominal aortic aneurysm) (HCC)    a. Chronic w/o evidence of aneurysmal dil on CTA 01/2016.   Alcohol abuse    Allergy    Anemia    Anxiety    Asthma    Atrial fibrillation (HCC)    CAD (coronary artery disease)    a. 08/2010 s/p CABG x 1 (VG->RCA) @ time of Ao dissection repair; b. 01/2016 Lexiscan  MV: mid antsept/apical defect w/ ? peri-infarct ischemia-->likely attenuation-->Med Rx.   Chewing difficulty    Chronic combined systolic (congestive) and diastolic (congestive) heart failure (HCC)    a. 2012 EF 30-35%; b. 04/2015 EF 40-45%; c. 01/2016 Echo: Ef 55-65%, nl AoV bioprosthesis, nl RV, nl PASP.  Constipation    Depression    Edema of both lower extremities    Gallbladder sludge    GERD (gastroesophageal reflux disease)    Heart failure (HCC)    Heart valve problem    History of Bicuspid Aortic Valve    a. 08/2010 s/p AVR @ time of Ao dissection repair; b. 01/2016 Echo: Ef 55-65%, nl AoV bioprosthesis, nl RV, nl PASP.   Hx of repair of dissecting thoracic aortic aneurysm, Stanford type A    a. 08/2010 s/p repair with AVR and VG->RCA; b. 01/2016 CTA: stable appearance of Asc Thoracic Aortic graft. Opacification of flase lumen of chronic abd Ao dissection w/ retrograde flow through lumbar arteries. No aneurysmal dil of flase lumen.   Hyperlipidemia    Hypertension    Hypertensive  heart disease    IBS (irritable bowel syndrome)    Internal hemorrhoids    Kidney problem    Marfan syndrome    pt denies   Migraine    Obstructive sleep apnea    Osteoarthritis    Osteoporosis    Palpitations    Pneumonia    RA (rheumatoid arthritis) (HCC)    a. Followed by Dr. Balinda    Past Surgical History:  Procedure Laterality Date   ABDOMINAL AORTIC ANEURYSM REPAIR     CARDIAC CATHETERIZATION  2001   CARDIOVERSION N/A 04/04/2024   Procedure: CARDIOVERSION;  Surgeon: Rachel Evalene PARAS, MD;  Location: [facility] ORS;  Service: Cardiovascular;  Laterality: N/A;   CESAREAN SECTION  1986 & 1991   CHOLECYSTECTOMY     COLONOSCOPY     COLONOSCOPY WITH PROPOFOL  N/A 01/19/2024   Procedure: COLONOSCOPY WITH PROPOFOL ;  Surgeon: Therisa Bi, MD;  Location: [facility] ENDOSCOPY;  Service: Gastroenterology;  Laterality: N/A;   ESOPHAGOGASTRODUODENOSCOPY (EGD) WITH PROPOFOL  N/A 05/08/2020   Procedure: ESOPHAGOGASTRODUODENOSCOPY (EGD) WITH PROPOFOL ;  Surgeon: Janalyn Keene NOVAK, MD;  Location: [facility] ENDOSCOPY;  Service: Endoscopy;  Laterality: N/A;   FRACTURE SURGERY Right    shoulder replacement   HEMORRHOID BANDING     ORIF DISTAL RADIUS FRACTURE Left    POLYPECTOMY  01/19/2024   Procedure: POLYPECTOMY;  Surgeon: Therisa Bi, MD;  Location: [facility] ENDOSCOPY;  Service: Gastroenterology;;   UPPER GASTROINTESTINAL ENDOSCOPY      MEDICATIONS:  acetaminophen  (TYLENOL ) 500 MG tablet   albuterol  (VENTOLIN  HFA) 108 (90 Base) MCG/ACT inhaler   ALPRAZolam  (XANAX ) 0.5 MG tablet   aspirin  EC 81 MG tablet   Calcium  Carbonate (CALCIUM  600 PO)   carvedilol  (COREG ) 12.5 MG tablet   cetirizine  (ZYRTEC ) 10 MG tablet   cyclobenzaprine  (FLEXERIL ) 10 MG tablet   dapagliflozin  propanediol (FARXIGA ) 10 MG TABS tablet   ELIQUIS  5 MG TABS tablet   ezetimibe  (ZETIA ) 10 MG tablet   fluticasone  (FLONASE ) 50 MCG/ACT nasal spray   furosemide  (LASIX ) 20 MG tablet   isosorbide  mononitrate (IMDUR ) 30 MG 24 hr tablet    lisinopril  (ZESTRIL ) 5 MG tablet   magnesium  gluconate (MAGONATE) 500 MG tablet   Multiple Vitamins-Minerals (VITAFUSION MULTI WOMENS) CHEW   nitroGLYCERIN  (NITROSTAT ) 0.4 MG SL tablet   omeprazole  (PRILOSEC) 40 MG capsule   OVER THE COUNTER MEDICATION   OVER THE COUNTER MEDICATION   OVER THE COUNTER MEDICATION   OVER THE COUNTER MEDICATION   promethazine  (PHENERGAN ) 25 MG tablet   Spacer/Aero-Holding Chambers (COMPACT SPACE CHAMBER) DEVI   spironolactone  (ALDACTONE ) 25 MG tablet   TART CHERRY PO   venlafaxine  XR (EFFEXOR -XR) 150 MG 24 hr capsule   venlafaxine  XR (EFFEXOR -XR) 37.5  MG 24 hr capsule   Vitamin D -Vitamin K (VITAMIN K2-VITAMIN D3 PO)   No current facility-administered medications for this encounter.   Last Farxiga  planned for 07/14/2024.   Isaiah Ruder, PA-C Surgical Short Stay/Anesthesiology [facility] Phone [phone] [facility] Phone [phone] 07/16/2024 4:32 PM

## 2024-07-16 NOTE — Anesthesia Preprocedure Evaluation (Signed)
 Anesthesia Evaluation  Patient identified by MRN, date of birth, ID band Patient awake    Reviewed: Allergy & Precautions, NPO status , Patient's Chart, lab work & pertinent test results, reviewed documented beta blocker date and time   History of Anesthesia Complications Negative for: history of anesthetic complications  Airway Mallampati: II  TM Distance: >3 FB Neck ROM: Full    Dental  (+) Dental Advisory Given   Pulmonary asthma , sleep apnea , former smoker   Pulmonary exam normal        Cardiovascular hypertension, Pt. on home beta blockers and Pt. on medications + CAD and + CABG  Normal cardiovascular exam+ dysrhythmias Atrial Fibrillation    '25 TTE - EF 50-55%. Right atrial size was mildly dilated. No significant valvular d/o identified.  S/p repair of Stanford A aortic dissection     Neuro/Psych  Headaches PSYCHIATRIC DISORDERS Anxiety Depression     Neuromuscular disease    GI/Hepatic ,GERD  Medicated and Controlled,,(+)     substance abuse  alcohol use  Endo/Other   Obesity   Renal/GU negative Renal ROS     Musculoskeletal  (+) Arthritis , Rheumatoid disorders,    Abdominal   Peds  Hematology  On eliquis     Anesthesia Other Findings   Reproductive/Obstetrics                              Anesthesia Physical Anesthesia Plan  ASA: 3  Anesthesia Plan: General   Post-op Pain Management: Tylenol  PO (pre-op)*   Induction: Intravenous  PONV Risk Score and Plan: 3 and Treatment may vary due to age or medical condition, Ondansetron , Dexamethasone  and Midazolam   Airway Management Planned: LMA  Additional Equipment: None  Intra-op Plan:   Post-operative Plan: Extubation in OR  Informed Consent: I have reviewed the patients History and Physical, chart, labs and discussed the procedure including the risks, benefits and alternatives for the proposed anesthesia with  the patient or authorized representative who has indicated his/her understanding and acceptance.     Dental advisory given  Plan Discussed with: CRNA and Anesthesiologist  Anesthesia Plan Comments: (PAT note written 07/16/2024 by Rishaan Gunner, PA-C.)         Anesthesia Quick Evaluation

## 2024-07-17 ENCOUNTER — Ambulatory Visit (INDEPENDENT_AMBULATORY_CARE_PROVIDER_SITE_OTHER): Payer: Medicare HMO | Admitting: Psychology

## 2024-07-17 ENCOUNTER — Ambulatory Visit

## 2024-07-17 DIAGNOSIS — F331 Major depressive disorder, recurrent, moderate: Secondary | ICD-10-CM | POA: Diagnosis not present

## 2024-07-17 NOTE — Progress Notes (Signed)
 [facility] Counselor/Therapist Progress Note  Patient ID: Rachel Torres, MRN: [id number],    Date: 07/17/2024  Time Spent: 10:00am-10:55am    Treatment Type: Individual Therapy  Reported Symptoms: stress, overwhelm  Mental Status Exam: Appearance:  Casual     Behavior: Appropriate  Motor: Normal  Speech/Language:  Normal Rate  Affect: Appropriate  Mood: normal  Thought process: normal  Thought content:   WNL  Sensory/Perceptual disturbances:   WNL  Orientation: oriented to person, place, time/date, and situation  Attention: Good  Concentration: Good  Memory: WNL  Fund of knowledge:  Good  Insight:   Good  Judgment:  Good  Impulse Control: Good   Risk Assessment: Danger to Self:  No Self-injurious Behavior: No Danger to Others: No Duty to Warn:no Physical Aggression / Violence:No  Access to Firearms a concern: No  Gang Involvement:No   Subjective: Pt present for face-to-face individual therapy via video.  Pt consents to telehealth video session and is aware of limitations and benefits of virtual sessions.   Location of pt: home Location of therapist: home office.   Pt talked about her health.  She has a day surgery procedure on the nerve at her knee tomorrow.  Pt will need additional care giving help for [name] which she has arranged with the [facility].     [name] is moving out in 3 weeks.  Pt will miss the help with [name].   Pt talked about her husband [name].  Pt gets overwhelmed with care giving for [name].  Addressed the stress of caregiving for [name].   Pt gets frustrated with [name] cognitive challenges.  Addressed how this impacts pt.  Pt worries about how [name] will progress.  Worked on Optician, dispensing.  Pt talked about her mother.   Pt gets frustrated with her mother at times bc she gets anxious.  Addressed their interactions and how it impacts pt.  Helped pt process relationship dynamics.  Worked on self care  strategies. Provided supportive therapy.    Interventions: Cognitive Behavioral Therapy and Insight-Oriented  Diagnosis:  F33.1   Plan of Care: Recommend ongoing therapy.   Pt participated in setting treatment goals.   Pt needs a place to talk about stressors.  Plan to meet every week.  Pt agrees with treatment plan.    Treatment Plan (target date: 03/27/2025) Client Abilities/Strengths  Pt is bright, engaging, and motivated for therapy.   Client Treatment Preferences  Individual therapy.  Client Statement of Needs  Improve coping skills.  Have a place to talk about stressors.   Symptoms  Depressed or irritable mood. Unresolved grief issues.  Problems Addressed  Unipolar Depression Goals 1. Alleviate depressive symptoms and return to previous level of effective functioning. 2. Appropriately grieve the loss in order to normalize mood and to return to previously adaptive level of functioning. Objective Learn and implement behavioral strategies to overcome depression. Target Date: 2025-03-27 Frequency: weekly  Progress: 45 Modality: individual  Related Interventions Engage the client in behavioral activation, increasing his/her activity level and contact with sources of reward, while identifying processes that inhibit activation.  Use behavioral techniques such as instruction, rehearsal, role-playing, role reversal, as needed, to facilitate activity in the client's daily life; reinforce success. Assist the client in developing skills that increase the likelihood of deriving pleasure from behavioral activation (e.g., assertiveness skills, developing an exercise plan, less internal/more external focus, increased social involvement); reinforce success. Objective Identify important people in life, past and present, and describe  the quality, good and poor, of those relationships. Target Date: 2025-03-27 Frequency: weekly  Progress: 45 Modality: individual  Related Interventions Conduct  Interpersonal Therapy beginning with the assessment of the client's interpersonal inventory of important past and present relationships; develop a case formulation linking depression to grief, interpersonal role disputes, role transitions, and/or interpersonal deficits). Objective Learn and implement problem-solving and decision-making skills. Target Date: 2025-03-27 Frequency: weekly  Progress: 45 Modality: individual  Related Interventions Conduct Problem-Solving Therapy using techniques such as psychoeducation, modeling, and role-playing to teach client problem-solving skills (i.e., defining a problem specifically, generating possible solutions, evaluating the pros and cons of each solution, selecting and implementing a plan of action, evaluating the efficacy of the plan, accepting or revising the plan); role-play application of the problem-solving skill to a real life issue. Encourage in the client the development of a positive problem orientation in which problems and solving them are viewed as a natural part of life and not something to be feared, despaired, or avoided. 3. Develop healthy interpersonal relationships that lead to the alleviation and help prevent the relapse of depression. 4. Develop healthy thinking patterns and beliefs about self, others, and the world that lead to the alleviation and help prevent the relapse of depression. 5. Recognize, accept, and cope with feelings of depression. Diagnosis F33.1  Conditions For Discharge Achievement of treatment goals and objectives   Veva Alma, LCSW

## 2024-07-18 ENCOUNTER — Encounter (HOSPITAL_COMMUNITY): Payer: Self-pay | Admitting: Neurosurgery

## 2024-07-18 ENCOUNTER — Other Ambulatory Visit: Payer: Self-pay

## 2024-07-18 ENCOUNTER — Ambulatory Visit (HOSPITAL_COMMUNITY): Payer: Self-pay | Admitting: Vascular Surgery

## 2024-07-18 ENCOUNTER — Ambulatory Visit (HOSPITAL_COMMUNITY): Payer: Self-pay | Admitting: Anesthesiology

## 2024-07-18 ENCOUNTER — Encounter (HOSPITAL_COMMUNITY)
Admission: RE | Disposition: A | Discharge status: Home / Self Care | Payer: Self-pay | Source: Home / Self Care | Attending: Neurosurgery

## 2024-07-18 ENCOUNTER — Observation Stay (HOSPITAL_COMMUNITY)
Admission: RE | Admit: 2024-07-18 | Discharge: 2024-07-18 | Disposition: A | Discharge status: Home / Self Care | Attending: Neurosurgery | Admitting: Neurosurgery

## 2024-07-18 DIAGNOSIS — I4891 Unspecified atrial fibrillation: Secondary | ICD-10-CM | POA: Diagnosis not present

## 2024-07-18 DIAGNOSIS — I2510 Atherosclerotic heart disease of native coronary artery without angina pectoris: Secondary | ICD-10-CM

## 2024-07-18 DIAGNOSIS — Z7901 Long term (current) use of anticoagulants: Secondary | ICD-10-CM | POA: Insufficient documentation

## 2024-07-18 DIAGNOSIS — G5732 Lesion of lateral popliteal nerve, left lower limb: Secondary | ICD-10-CM

## 2024-07-18 DIAGNOSIS — Z79899 Other long term (current) drug therapy: Secondary | ICD-10-CM | POA: Diagnosis not present

## 2024-07-18 DIAGNOSIS — Z87891 Personal history of nicotine dependence: Secondary | ICD-10-CM | POA: Insufficient documentation

## 2024-07-18 DIAGNOSIS — M6281 Muscle weakness (generalized): Secondary | ICD-10-CM | POA: Diagnosis not present

## 2024-07-18 DIAGNOSIS — I110 Hypertensive heart disease with heart failure: Secondary | ICD-10-CM | POA: Insufficient documentation

## 2024-07-18 DIAGNOSIS — I5042 Chronic combined systolic (congestive) and diastolic (congestive) heart failure: Secondary | ICD-10-CM | POA: Insufficient documentation

## 2024-07-18 DIAGNOSIS — M79605 Pain in left leg: Secondary | ICD-10-CM | POA: Diagnosis present

## 2024-07-18 DIAGNOSIS — Z7982 Long term (current) use of aspirin: Secondary | ICD-10-CM | POA: Insufficient documentation

## 2024-07-18 DIAGNOSIS — Z91010 Allergy to peanuts: Secondary | ICD-10-CM | POA: Diagnosis not present

## 2024-07-18 HISTORY — PX: PERONEAL NERVE DECOMPRESSION: SHX2226

## 2024-07-18 SURGERY — PERONEAL NERVE DECOMPRESSION
Anesthesia: General | Laterality: Left

## 2024-07-18 MED ORDER — ACETAMINOPHEN 650 MG RE SUPP: 650.0000 mg | RECTAL | Status: DC | PRN

## 2024-07-18 MED ORDER — PHENYLEPHRINE 80 MCG/ML (10ML) SYRINGE FOR IV PUSH (FOR BLOOD PRESSURE SUPPORT)
PREFILLED_SYRINGE | INTRAVENOUS | Status: AC
  Filled 2024-07-18: qty 10

## 2024-07-18 MED ORDER — PROPOFOL 10 MG/ML IV BOLUS
INTRAVENOUS | Status: AC
  Filled 2024-07-18: qty 20

## 2024-07-18 MED ORDER — MENTHOL 3 MG MT LOZG: 1.0000 | LOZENGE | OROMUCOSAL | Status: DC | PRN

## 2024-07-18 MED ORDER — LIDOCAINE 2% (20 MG/ML) 5 ML SYRINGE
INTRAMUSCULAR | Status: DC | PRN
  Administered 2024-07-18: 60 mg via INTRAVENOUS

## 2024-07-18 MED ORDER — ACETAMINOPHEN 500 MG PO TABS
1000.0000 mg | ORAL_TABLET | Freq: Once | ORAL | Status: AC
  Administered 2024-07-18: 1000 mg via ORAL
  Filled 2024-07-18: qty 2

## 2024-07-18 MED ORDER — BUPIVACAINE HCL (PF) 0.25 % IJ SOLN
INTRAMUSCULAR | Status: AC
  Filled 2024-07-18: qty 30

## 2024-07-18 MED ORDER — FENTANYL CITRATE (PF) 250 MCG/5ML IJ SOLN
INTRAMUSCULAR | Status: AC
  Filled 2024-07-18: qty 5

## 2024-07-18 MED ORDER — ALBUTEROL SULFATE HFA 108 (90 BASE) MCG/ACT IN AERS
INHALATION_SPRAY | RESPIRATORY_TRACT | Status: AC
  Filled 2024-07-18: qty 6.7

## 2024-07-18 MED ORDER — SODIUM CHLORIDE 0.9% FLUSH
3.0000 mL | Freq: Two times a day (BID) | INTRAVENOUS | Status: DC
  Administered 2024-07-18: 3 mL via INTRAVENOUS

## 2024-07-18 MED ORDER — CEFAZOLIN SODIUM-DEXTROSE 1-4 GM/50ML-% IV SOLN
1.0000 g | Freq: Three times a day (TID) | INTRAVENOUS | Status: DC
  Administered 2024-07-18: 1 g via INTRAVENOUS
  Filled 2024-07-18: qty 50

## 2024-07-18 MED ORDER — DIPHENHYDRAMINE HCL 25 MG PO CAPS
25.0000 mg | ORAL_CAPSULE | Freq: Four times a day (QID) | ORAL | Status: DC | PRN
  Administered 2024-07-18: 25 mg via ORAL
  Filled 2024-07-18: qty 1

## 2024-07-18 MED ORDER — DEXAMETHASONE SODIUM PHOSPHATE 10 MG/ML IJ SOLN
INTRAMUSCULAR | Status: AC
  Filled 2024-07-18: qty 1

## 2024-07-18 MED ORDER — CHLORHEXIDINE GLUCONATE CLOTH 2 % EX PADS: 6.0000 | MEDICATED_PAD | Freq: Once | CUTANEOUS | Status: DC

## 2024-07-18 MED ORDER — ROCURONIUM BROMIDE 10 MG/ML (PF) SYRINGE
PREFILLED_SYRINGE | INTRAVENOUS | Status: AC
  Filled 2024-07-18: qty 10

## 2024-07-18 MED ORDER — PROPOFOL 10 MG/ML IV BOLUS
INTRAVENOUS | Status: DC | PRN
  Administered 2024-07-18: 100 ug/kg/min via INTRAVENOUS
  Administered 2024-07-18: 120 mg via INTRAVENOUS

## 2024-07-18 MED ORDER — HYDROCODONE-ACETAMINOPHEN 5-325 MG PO TABS
1.0000 | ORAL_TABLET | ORAL | Status: DC | PRN
  Administered 2024-07-18: 1 via ORAL
  Filled 2024-07-18: qty 1

## 2024-07-18 MED ORDER — ALBUMIN HUMAN 5 % IV SOLN
INTRAVENOUS | Status: AC
  Filled 2024-07-18: qty 250

## 2024-07-18 MED ORDER — LACTATED RINGERS IV SOLN: INTRAVENOUS | Status: DC

## 2024-07-18 MED ORDER — ONDANSETRON HCL 4 MG/2ML IJ SOLN
INTRAMUSCULAR | Status: DC | PRN
  Administered 2024-07-18: 4 mg via INTRAVENOUS

## 2024-07-18 MED ORDER — MIDAZOLAM HCL 2 MG/2ML IJ SOLN
INTRAMUSCULAR | Status: DC | PRN
  Administered 2024-07-18: 2 mg via INTRAVENOUS

## 2024-07-18 MED ORDER — ONDANSETRON HCL 4 MG PO TABS: 4.0000 mg | ORAL_TABLET | Freq: Four times a day (QID) | ORAL | Status: DC | PRN

## 2024-07-18 MED ORDER — EPHEDRINE 5 MG/ML INJ
INTRAVENOUS | Status: AC
  Filled 2024-07-18: qty 5

## 2024-07-18 MED ORDER — CHLORHEXIDINE GLUCONATE 0.12 % MT SOLN
15.0000 mL | Freq: Once | OROMUCOSAL | Status: AC
  Administered 2024-07-18: 15 mL via OROMUCOSAL
  Filled 2024-07-18: qty 15

## 2024-07-18 MED ORDER — KETOROLAC TROMETHAMINE 15 MG/ML IJ SOLN
7.5000 mg | Freq: Four times a day (QID) | INTRAMUSCULAR | Status: DC
  Administered 2024-07-18 (×2): 7.5 mg via INTRAVENOUS
  Filled 2024-07-18 (×2): qty 1

## 2024-07-18 MED ORDER — MIDAZOLAM HCL 2 MG/2ML IJ SOLN
INTRAMUSCULAR | Status: AC
  Filled 2024-07-18: qty 2

## 2024-07-18 MED ORDER — SODIUM CHLORIDE 0.9% FLUSH: 3.0000 mL | INTRAVENOUS | Status: DC | PRN

## 2024-07-18 MED ORDER — CEFAZOLIN SODIUM-DEXTROSE 2-4 GM/100ML-% IV SOLN
2.0000 g | INTRAVENOUS | Status: AC
  Administered 2024-07-18: 2 g via INTRAVENOUS
  Filled 2024-07-18: qty 100

## 2024-07-18 MED ORDER — ONDANSETRON HCL 4 MG/2ML IJ SOLN
INTRAMUSCULAR | Status: AC
  Filled 2024-07-18: qty 2

## 2024-07-18 MED ORDER — PHENYLEPHRINE HCL-NACL 20-0.9 MG/250ML-% IV SOLN
INTRAVENOUS | Status: DC | PRN
  Administered 2024-07-18: 40 ug/min via INTRAVENOUS

## 2024-07-18 MED ORDER — ACETAMINOPHEN 325 MG PO TABS
650.0000 mg | ORAL_TABLET | ORAL | Status: DC | PRN
Start: 2024-07-18 — End: 2024-07-19

## 2024-07-18 MED ORDER — LIDOCAINE 2% (20 MG/ML) 5 ML SYRINGE
INTRAMUSCULAR | Status: AC
  Filled 2024-07-18: qty 5

## 2024-07-18 MED ORDER — ONDANSETRON HCL 4 MG/2ML IJ SOLN
INTRAMUSCULAR | Status: AC
Start: 2024-07-18 — End: 2024-07-18
  Filled 2024-07-18: qty 2

## 2024-07-18 MED ORDER — ALBUTEROL SULFATE HFA 108 (90 BASE) MCG/ACT IN AERS
INHALATION_SPRAY | RESPIRATORY_TRACT | Status: DC | PRN
  Administered 2024-07-18: 2 via RESPIRATORY_TRACT

## 2024-07-18 MED ORDER — EPHEDRINE SULFATE-NACL 50-0.9 MG/10ML-% IV SOSY
PREFILLED_SYRINGE | INTRAVENOUS | Status: DC | PRN
Start: 2024-07-18 — End: 2024-07-18
  Administered 2024-07-18 (×2): 10 mg via INTRAVENOUS

## 2024-07-18 MED ORDER — ONDANSETRON HCL 4 MG/2ML IJ SOLN: 4.0000 mg | Freq: Once | INTRAMUSCULAR | Status: DC | PRN

## 2024-07-18 MED ORDER — SODIUM CHLORIDE (PF) 0.9 % IJ SOLN
INTRAMUSCULAR | Status: AC
  Filled 2024-07-18: qty 10

## 2024-07-18 MED ORDER — LIDOCAINE 2% (20 MG/ML) 5 ML SYRINGE
INTRAMUSCULAR | Status: AC
Start: 2024-07-18 — End: 2024-07-18
  Filled 2024-07-18: qty 5

## 2024-07-18 MED ORDER — HYDROMORPHONE HCL 1 MG/ML IJ SOLN: 1.0000 mg | INTRAMUSCULAR | Status: DC | PRN

## 2024-07-18 MED ORDER — VASOPRESSIN 20 UNIT/ML IV SOLN
INTRAVENOUS | Status: AC
  Filled 2024-07-18: qty 1

## 2024-07-18 MED ORDER — FENTANYL CITRATE (PF) 100 MCG/2ML IJ SOLN: 25.0000 ug | INTRAMUSCULAR | Status: DC | PRN

## 2024-07-18 MED ORDER — PHENYLEPHRINE 80 MCG/ML (10ML) SYRINGE FOR IV PUSH (FOR BLOOD PRESSURE SUPPORT)
PREFILLED_SYRINGE | INTRAVENOUS | Status: DC | PRN
  Administered 2024-07-18: 160 ug via INTRAVENOUS
  Administered 2024-07-18: 480 ug via INTRAVENOUS

## 2024-07-18 MED ORDER — 0.9 % SODIUM CHLORIDE (POUR BTL) OPTIME
TOPICAL | Status: DC | PRN
  Administered 2024-07-18: 1000 mL

## 2024-07-18 MED ORDER — PHENOL 1.4 % MT LIQD: 1.0000 | OROMUCOSAL | Status: DC | PRN

## 2024-07-18 MED ORDER — VASOPRESSIN 20 UNIT/ML IV SOLN
INTRAVENOUS | Status: DC | PRN
Start: 2024-07-18 — End: 2024-07-18
  Administered 2024-07-18 (×4): 1 [IU] via INTRAVENOUS

## 2024-07-18 MED ORDER — ALBUMIN HUMAN 5 % IV SOLN: INTRAVENOUS | Status: DC | PRN

## 2024-07-18 MED ORDER — ALBUMIN HUMAN 5 % IV SOLN
12.5000 g | Freq: Once | INTRAVENOUS | Status: AC
  Administered 2024-07-18: 12.5 g via INTRAVENOUS

## 2024-07-18 MED ORDER — ORAL CARE MOUTH RINSE: 15.0000 mL | Freq: Once | OROMUCOSAL | Status: AC

## 2024-07-18 MED ORDER — SODIUM CHLORIDE 0.9 % IV SOLN
250.0000 mL | INTRAVENOUS | Status: DC
  Administered 2024-07-18: 250 mL via INTRAVENOUS

## 2024-07-18 MED ORDER — ONDANSETRON HCL 4 MG/2ML IJ SOLN: 4.0000 mg | Freq: Four times a day (QID) | INTRAMUSCULAR | Status: DC | PRN

## 2024-07-18 MED ORDER — FENTANYL CITRATE (PF) 250 MCG/5ML IJ SOLN
INTRAMUSCULAR | Status: DC | PRN
  Administered 2024-07-18: 100 ug via INTRAVENOUS
  Administered 2024-07-18: 50 ug via INTRAVENOUS

## 2024-07-18 MED ORDER — ALBUMIN HUMAN 25 % IV SOLN: 12.5000 g | Freq: Once | INTRAVENOUS | Status: DC

## 2024-07-18 MED ORDER — HYDROCODONE-ACETAMINOPHEN 5-325 MG PO TABS: 1.0000 | ORAL_TABLET | ORAL | 0 refills | Status: DC | PRN

## 2024-07-18 SURGICAL SUPPLY — 34 items
BAG COUNTER SPONGE SURGICOUNT (BAG) ×1 IMPLANT
BENZOIN TINCTURE PRP APPL 2/3 (GAUZE/BANDAGES/DRESSINGS) ×1 IMPLANT
BNDG ELASTIC 4X5.8 VLCR STR LF (GAUZE/BANDAGES/DRESSINGS) ×1 IMPLANT
BNDG GAUZE DERMACEA FLUFF 4 (GAUZE/BANDAGES/DRESSINGS) ×1 IMPLANT
CANISTER SUCTION 3000ML PPV (SUCTIONS) ×1 IMPLANT
DERMABOND ADVANCED .7 DNX12 (GAUZE/BANDAGES/DRESSINGS) IMPLANT
DRAPE LAPAROTOMY 100X72X124 (DRAPES) IMPLANT
DRSG OPSITE POSTOP 4X6 (GAUZE/BANDAGES/DRESSINGS) IMPLANT
DURAPREP 26ML APPLICATOR (WOUND CARE) ×1 IMPLANT
ELECTRODE REM PT RTRN 9FT ADLT (ELECTROSURGICAL) ×1 IMPLANT
GAUZE SPONGE 4X4 12PLY STRL (GAUZE/BANDAGES/DRESSINGS) ×1 IMPLANT
GLOVE BIO SURGEON STRL SZ7 (GLOVE) IMPLANT
GLOVE BIOGEL PI IND STRL 7.0 (GLOVE) IMPLANT
GLOVE ECLIPSE 9.0 STRL (GLOVE) ×1 IMPLANT
GOWN STRL REUS W/ TWL LRG LVL3 (GOWN DISPOSABLE) IMPLANT
GOWN STRL REUS W/ TWL XL LVL3 (GOWN DISPOSABLE) IMPLANT
GOWN STRL REUS W/TWL 2XL LVL3 (GOWN DISPOSABLE) ×1 IMPLANT
KIT BASIN OR (CUSTOM PROCEDURE TRAY) ×1 IMPLANT
KIT TURNOVER KIT B (KITS) ×1 IMPLANT
LOOP VASCLR MAXI BLUE 18IN ST (MISCELLANEOUS) ×1 IMPLANT
LOOPS VASCLR MAXI BLUE 18IN ST (MISCELLANEOUS) ×1 IMPLANT
NDL HYPO 25X1 1.5 SAFETY (NEEDLE) ×1 IMPLANT
NEEDLE HYPO 25X1 1.5 SAFETY (NEEDLE) ×1 IMPLANT
NS IRRIG 1000ML POUR BTL (IV SOLUTION) ×1 IMPLANT
PACK LAMINECTOMY NEURO (CUSTOM PROCEDURE TRAY) ×1 IMPLANT
PAD ARMBOARD POSITIONER FOAM (MISCELLANEOUS) ×1 IMPLANT
STOCKINETTE 4X48 STRL (DRAPES) ×1 IMPLANT
STRIP CLOSURE SKIN 1/2X4 (GAUZE/BANDAGES/DRESSINGS) ×1 IMPLANT
SUT VIC AB 2-0 CP2 18 (SUTURE) ×1 IMPLANT
SUT VIC AB 3-0 SH 8-18 (SUTURE) ×1 IMPLANT
TOWEL GREEN STERILE (TOWEL DISPOSABLE) ×1 IMPLANT
TOWEL GREEN STERILE FF (TOWEL DISPOSABLE) ×1 IMPLANT
UNDERPAD 30X36 HEAVY ABSORB (UNDERPADS AND DIAPERS) ×1 IMPLANT
WATER STERILE IRR 1000ML POUR (IV SOLUTION) ×1 IMPLANT

## 2024-07-18 NOTE — Discharge Summary (Signed)
 Physician Discharge Summary     Providing Compassionate, Quality Care - Together   Patient ID: Rachel Torres MRN: [id number] DOB/AGE: 27-Apr-1956 68 y.o.  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 Louis on 07/18/2024. She was admitted to 3C04 for observation following recovery from anesthesia in the PACU. Her postoperative course has been uncomplicated. She has worked with occupational therapy who feels the patient is ready for discharge home. She is ambulating independently and without difficulty. She is tolerating a normal diet. Her pain is well-controlled with oral pain medication. She is ready for discharge home.   Consults: None  Treatments: surgery: Left peroneal nerve decompression   Discharge Exam: Blood pressure 103/62, pulse 65, temperature 98 F (36.7 C), resp. rate 20, height 5' 5 (1.651 m), weight 97.8 kg, SpO2 94%.  Per report: Alert and oriented x 4 PERRLA MAE, Strength and sensation intact Incision is covered with Honeycomb dressing and Steri Strips; Dressing is clean, dry, and intact   Disposition: Discharge disposition: 01-Home or Self Care       Discharge Instructions     Call MD for:  difficulty breathing, headache or visual disturbances   Complete by: As directed    Call MD for:  hives   Complete by: As directed    Call MD for:  persistant nausea and vomiting   Complete by: As directed    Call MD for:  redness, tenderness, or signs of infection (pain, swelling, redness, odor or green/yellow discharge around incision site)   Complete by: As directed    Call MD for:  severe uncontrolled pain   Complete by: As directed    Diet - low sodium heart healthy   Complete by:  As directed    If the dressing is still on your incision site when you go home, remove it on the third day after your surgery date. Remove dressing if it begins to fall off, or if it is dirty or damaged before the third day.   Complete by: As directed    Increase activity slowly   Complete by: As directed       Allergies as of 07/18/2024       Reactions   Drysol [aluminum Chloride] Itching   Fosamax [alendronate Sodium] Other (See Comments)   GI upset   Ginzing [ginseng] Other (See Comments)   Hyperactivity Jittery   Hydrocodone  Itching   Reports that she can take with prn Benadryl    Imitrex [sumatriptan] Other (See Comments)   Contraindicated w/ heart condition   Lortab [hydrocodone -acetaminophen ] Itching   Reports that she can take with prn Benadryl    Neurontin  [gabapentin ] Other (See Comments)   Unknown reaction   Peanut (diagnostic) Other (See Comments)   Migraine    Roxicodone  [oxycodone ] Itching   Ultram  Cas.Cartwright ] Itching   Prednisone Rash   Tape Itching        Medication List     TAKE these medications    acetaminophen  500 MG tablet Commonly known as: TYLENOL  Take 500 mg by mouth every 6 (six) hours as needed for moderate pain (pain score 4-6), fever or headache.   albuterol  108 (90 Base) MCG/ACT inhaler Commonly known as: VENTOLIN  HFA Inhale 1-2 puffs into the lungs every 6 (six) hours as needed for wheezing or  shortness of breath.   ALPRAZolam  0.5 MG tablet Commonly known as: Xanax  Take 1 tablet (0.5 mg total) by mouth daily as needed for anxiety.   aspirin  EC 81 MG tablet Take 81 mg by mouth at bedtime.   CALCIUM  600 PO Take 600 mg by mouth daily.   carvedilol  12.5 MG tablet Commonly known as: COREG  Take 18.75 mg by mouth 2 (two) times daily with a meal.   cetirizine  10 MG tablet Commonly known as: ZYRTEC  Take 1 tablet (10 mg total) by mouth daily as needed for allergies.   Compact Designer, multimedia USE WITH INHALER   cyclobenzaprine  10  MG tablet Commonly known as: FLEXERIL  Take 0.5-1 tablets (5-10 mg total) by mouth 3 (three) times daily as needed. For headache   dapagliflozin  propanediol 10 MG Tabs tablet Commonly known as: Farxiga  Take 1 tablet (10 mg total) by mouth daily before breakfast.   Eliquis  5 MG Tabs tablet Generic drug: apixaban  TAKE ONE TABLET BY MOUTH TWICE A DAY   ezetimibe  10 MG tablet Commonly known as: ZETIA  TAKE 1 TABLET EVERY DAY   fluticasone  50 MCG/ACT nasal spray Commonly known as: FLONASE  Place 2 sprays into both nostrils daily as needed for allergies or rhinitis.   furosemide  20 MG tablet Commonly known as: LASIX  Take 2 tablets (40 mg total) by mouth daily.   HYDROcodone -acetaminophen  5-325 MG tablet Commonly known as: NORCO/VICODIN Take 1 tablet by mouth every 4 (four) hours as needed for moderate pain (pain score 4-6) ((score 4 to 6)).   isosorbide  mononitrate 30 MG 24 hr tablet Commonly known as: IMDUR  TAKE 1/2 TABLET EVERY DAY   lisinopril  5 MG tablet Commonly known as: ZESTRIL  Take 0.5 tablets (2.5 mg total) by mouth daily.   magnesium  gluconate 500 MG tablet Commonly known as: MAGONATE Take 500 mg by mouth at bedtime.   nitroGLYCERIN  0.4 MG SL tablet Commonly known as: NITROSTAT  Place 1 tablet (0.4 mg total) under the tongue every 5 (five) minutes as needed for chest pain.   omeprazole  40 MG capsule Commonly known as: PRILOSEC TAKE 1 CAPSULE EVERY DAY   OVER THE COUNTER MEDICATION Take 1 each by mouth 2 (two) times daily. GOLI SF prebiotic/probiotic gummy   OVER THE COUNTER MEDICATION Take 1 each by mouth 2 (two) times daily. GOLI SF Ashwagandha gummies   OVER THE COUNTER MEDICATION Take 2 each by mouth 2 (two) times daily. GOLI SF Apple Cider Vinegar gummies   OVER THE COUNTER MEDICATION Take 1 each by mouth daily. Ollie prebiotic/probiotic gummy   promethazine  25 MG tablet Commonly known as: PHENERGAN  Take 0.5 tablets (12.5 mg total) by mouth every 8  (eight) hours as needed for nausea.   spironolactone  25 MG tablet Commonly known as: ALDACTONE  Take 1 tablet (25 mg total) by mouth daily.   TART CHERRY PO Take 3,000 mg by mouth 2 (two) times daily.   venlafaxine  XR 150 MG 24 hr capsule Commonly known as: EFFEXOR -XR TAKE ONE CAPSULE BY MOUTH ONE TIME DAILY WITH BREAKFAST   venlafaxine  XR 37.5 MG 24 hr capsule Commonly known as: EFFEXOR -XR TAKE ONE CAPSULE BY MOUTH ONE TIME DAILY WITH BREAKFAST   Vitafusion Multi Womens Chew Chew 1 each by mouth 2 (two) times daily.   VITAMIN K2-VITAMIN D3 PO Take 1 tablet by mouth daily.               Discharge Care Instructions  (From admission, onward)           Start  Ordered   07/18/24 0000  If the dressing is still on your incision site when you go home, remove it on the third day after your surgery date. Remove dressing if it begins to fall off, or if it is dirty or damaged before the third day.        07/18/24 1751            Follow-up Information     Louis Shove, MD Follow up.   Specialty: Neurosurgery Contact information: [address] [place] [address] [phone]                 Signed: Gerard Beck, DNP, AGNP-C Nurse Practitioner  [facility] [address], [address], [place], [place] [address] P: [phone]    F: [phone]  07/18/2024, 5:53 PM

## 2024-07-18 NOTE — Brief Op Note (Signed)
 07/18/2024  9:15 AM  PATIENT:  Rachel Torres  68 y.o. female  PRE-OPERATIVE DIAGNOSIS:  Entrapment neuropathy of common peroneal nerve  POST-OPERATIVE DIAGNOSIS:  Entrapment neuropathy of common peroneal nerve  PROCEDURE:  Procedure(s) with comments: PERONEAL NERVE DECOMPRESSION (Left) - Left peroneal nerve decompression  SURGEON:  Surgeons and Role:    DEWAINE Louis Shove, MD - Primary  PHYSICIAN ASSISTANT:   ASSISTANTS:    ANESTHESIA:   general  EBL:  minimal   BLOOD ADMINISTERED:none  DRAINS: none   LOCAL MEDICATIONS USED:  NONE  SPECIMEN:  No Specimen  DISPOSITION OF SPECIMEN:  N/A  COUNTS:  YES  TOURNIQUET:  * No tourniquets in log *  DICTATION: .Dragon Dictation  PLAN OF CARE: Admit for overnight observation  PATIENT DISPOSITION:  PACU - hemodynamically stable.   Delay start of Pharmacological VTE agent (>24hrs) due to surgical blood loss or risk of bleeding: yes

## 2024-07-18 NOTE — Evaluation (Signed)
 Occupational Therapy Evaluation/Discharge Patient Details Name: Rachel Torres MRN: [id number] DOB: 1956/07/22 Today's Date: 07/18/2024   History of Present Illness   68 -year-old female s/p Left leg peroneal nerve /decompression performed 7/30. PMH: anxiety, A fib, CAD, HF, depression, GERD, HTN, IBS, OA, RA, and R shoulder replacement.     Clinical Impressions Patient evaluated by Occupational Therapy with no further acute OT needs identified. All education has been completed and the patient has no further questions. Pt and son have been educated on pain management techniques, safety awareness and compensatory techniques for ADL tasks as needed. Pt is primary caregiver for husband and does have assist lined up for tonight and part of tomorrow. Family is able to provide assist PRN as well. Pt is able to demonstrate independence with functional mobility within room. Pt is independent - mod I with ADL tasks. No follow-up Occupational Therapy or equipment needs. OT is signing off. Thank you for this referral.      If plan is discharge home, recommend the following:   Assistance with cooking/housework     Functional Status Assessment   Patient has had a recent decline in their functional status and demonstrates the ability to make significant improvements in function in a reasonable and predictable amount of time.     Equipment Recommendations   None recommended by OT      Precautions/Restrictions   Precautions Precautions: None Recall of Precautions/Restrictions: Intact Restrictions Weight Bearing Restrictions Per Provider Order: No     Mobility Bed Mobility Overal bed mobility: Independent   Transfers Overall transfer level: Independent Equipment used: None     Balance Overall balance assessment: Independent      ADL either performed or assessed with clinical judgement   ADL Overall ADL's : Independent;Modified independent;At baseline         Vision  Baseline Vision/History: 1 Wears glasses Ability to See in Adequate Light: 0 Adequate Patient Visual Report: No change from baseline Vision Assessment?: No apparent visual deficits     Perception Perception: Not tested       Praxis Praxis: Not tested       Pertinent Vitals/Pain Pain Assessment Pain Assessment: 0-10 Pain Score: 2  Pain Location: left lateral aspect of knee/surgical site Pain Descriptors / Indicators: Burning, Constant Pain Intervention(s): Monitored during session     Extremity/Trunk Assessment Upper Extremity Assessment Upper Extremity Assessment: Overall WFL for tasks assessed   Lower Extremity Assessment Lower Extremity Assessment: Overall WFL for tasks assessed   Cervical / Trunk Assessment Cervical / Trunk Assessment: Normal   Communication Communication Communication: No apparent difficulties   Cognition Arousal: Alert Behavior During Therapy: WFL for tasks assessed/performed Cognition: No apparent impairments   Following commands: Intact       Cueing  General Comments   Cueing Techniques: Verbal cues  honeycomb post op surgical dressing intact           Home Living Family/patient expects to be discharged to:: Private residence Living Arrangements: Spouse/significant other Available Help at Discharge: Family;Available PRN/intermittently;Personal care attendant (Pt is primary caregiver for husband who has Parkinson's and dementia. Will have caregiver assist tonight and part of day tomorrow. Family will also be able to provide PRN assist if needed.) Type of Home: House Home Access: Ramped entrance     Home Layout: One level     Bathroom Shower/Tub: Producer, television/film/video: Handicapped height Bathroom Accessibility: Yes   Home Equipment: Rolling Walker (2 wheels);Grab bars - toilet;Grab bars - tub/shower;Hand  held shower head;Shower seat          Prior Functioning/Environment Prior Level of Function :  Independent/Modified Independent    ADLs Comments: caregiver for husband.    OT Problem List: Decreased strength        OT Goals(Current goals can be found in the care plan section)   Acute Rehab OT Goals OT Goal Formulation: All assessment and education complete, DC therapy   OT Frequency:   1X visit       AM-PAC OT 6 Clicks Daily Activity     Outcome Measure Help from another person eating meals?: None Help from another person taking care of personal grooming?: None Help from another person toileting, which includes using toliet, bedpan, or urinal?: None Help from another person bathing (including washing, rinsing, drying)?: None Help from another person to put on and taking off regular upper body clothing?: None Help from another person to put on and taking off regular lower body clothing?: None 6 Click Score: 24   End of Session Nurse Communication: Mobility status  Activity Tolerance: Patient tolerated treatment well Patient left: in bed;with call bell/phone within reach;with family/visitor present  OT Visit Diagnosis: Muscle weakness (generalized) (M62.81)                Time: 8558-8541 OT Time Calculation (min): 17 min Charges:  OT General Charges $OT Visit: 1 Visit OT Evaluation $OT Eval Low Complexity: 1 Low  [employer], OTR/L,CBIS  Supplemental OT - [facility] and [employer] Secure Chat Preferred    Jazarah Capili, Leita BIRCH 07/18/2024, 3:06 PM

## 2024-07-18 NOTE — Anesthesia Procedure Notes (Signed)
 Procedure Name: Intubation Date/Time: 07/18/2024 8:14 AM  Performed by: Viviana Almarie DASEN, CRNAPre-anesthesia Checklist: Patient identified, Emergency Drugs available, Suction available and Patient being monitored Patient Re-evaluated:Patient Re-evaluated prior to induction Oxygen  Delivery Method: Circle system utilized Preoxygenation: Pre-oxygenation with 100% oxygen  Induction Type: IV induction Ventilation: Mask ventilation without difficulty LMA Size: 4.0 Tube type: Oral Number of attempts: 1 Airway Equipment and Method: Stylet and Bite block Placement Confirmation: ETT inserted through vocal cords under direct vision, positive ETCO2 and breath sounds checked- equal and bilateral Tube secured with: Tape Dental Injury: Teeth and Oropharynx as per pre-operative assessment

## 2024-07-18 NOTE — Op Note (Signed)
 Date of procedure: 07/18/2024  Date of dictation: Same  Service: Neurosurgery  Preoperative diagnosis: Left peroneal nerve entrapment  Postoperative diagnosis: Same  Procedure Name: Left peroneal nerve decompression  Surgeon:Avea Mcgowen A.Graviel Payeur, M.D.  Asst. Surgeon: None  Anesthesia: General  Indication: 68 year old female with distal left lower extremity pain numbness and weakness consistent with peroneal nerve entrapment confirmed by EMGs and nerve conduction studies.  Patient presents now for peroneal nerve decompression.  Operative note: After induction of anesthesia, patient positioned supine with her left knee flexed and rotated medially.  Patient's left lower extremities prepped and draped sterilely.  Incision made inferior to the fibular head on the left side.  This carried down sharply through the subcutaneous tissues.  Superficial fascia divided.  Working proximal and posterior to the fibular head the left common peroneal nerve was identified.  This was dissected free of investing tissue and tract distally around the fibular head.  The peroneus muscle was elevated.  Further investing scar tissue was resected around the deep peroneal nerve as it divided from the superficial.  Probe was passed easily along the course of the peroneal nerve and there was no evidence of any further compression.  Wound was then irrigated.  Wound was then closed in layers with Vicryl sutures.  Steri-Strips and sterile dressing were applied.  No apparent complications.  Patient tolerated procedure well and returns to recovery room postop.

## 2024-07-18 NOTE — Transfer of Care (Signed)
 Immediate Anesthesia Transfer of Care Note  Patient: Rachel Torres  Procedure(s) Performed: PERONEAL NERVE DECOMPRESSION (Left)  Patient Location: PACU  Anesthesia Type:General  Level of Consciousness: awake and patient cooperative  Airway & Oxygen  Therapy: Patient Spontanous Breathing and Patient connected to face mask oxygen   Post-op Assessment: Report given to RN, Post -op Vital signs reviewed and stable, Patient moving all extremities X 4, and Patient able to stick tongue midline  Post vital signs: Reviewed and stable  Last Vitals:  Vitals Value Taken Time  BP 112/60 07/18/24 09:42  Temp 97.5   Pulse 73 07/18/24 09:43  Resp 13 07/18/24 09:43  SpO2 97 % 07/18/24 09:43  Vitals shown include unfiled device data.  Last Pain:  Vitals:   07/18/24 0642  TempSrc:   PainSc: 0-No pain      Patients Stated Pain Goal: 0 (07/18/24 9371)  Complications: No notable events documented.

## 2024-07-18 NOTE — H&P (Signed)
 Rachel Torres is an 68 y.o. female.   Chief Complaint: Left foot weakness HPI: [age]-year-old female with left lower extremity pain numbness and weakness consistent with common peroneal nerve entrapment.  Patient has EMGs and nerve studies consistent with this.  She has failed conservative management.  She presents now for peroneal nerve decompression.  Past Medical History:  Diagnosis Date   AAA (abdominal aortic aneurysm) (HCC)    a. Chronic w/o evidence of aneurysmal dil on CTA 01/2016.   Alcohol abuse    Allergy    Anemia    Anxiety    Asthma    Atrial fibrillation (HCC)    CAD (coronary artery disease)    a. 08/2010 s/p CABG x 1 (VG->RCA) @ time of Ao dissection repair; b. 01/2016 Lexiscan  MV: mid antsept/apical defect w/ ? peri-infarct ischemia-->likely attenuation-->Med Rx.   Chewing difficulty    Chronic combined systolic (congestive) and diastolic (congestive) heart failure (HCC)    a. 2012 EF 30-35%; b. 04/2015 EF 40-45%; c. 01/2016 Echo: Ef 55-65%, nl AoV bioprosthesis, nl RV, nl PASP.   Constipation    Depression    Edema of both lower extremities    Gallbladder sludge    GERD (gastroesophageal reflux disease)    Heart failure (HCC)    Heart valve problem    History of Bicuspid Aortic Valve    a. 08/2010 s/p AVR @ time of Ao dissection repair; b. 01/2016 Echo: Ef 55-65%, nl AoV bioprosthesis, nl RV, nl PASP.   Hx of repair of dissecting thoracic aortic aneurysm, Stanford type A    a. 08/2010 s/p repair with AVR and VG->RCA; b. 01/2016 CTA: stable appearance of Asc Thoracic Aortic graft. Opacification of flase lumen of chronic abd Ao dissection w/ retrograde flow through lumbar arteries. No aneurysmal dil of flase lumen.   Hyperlipidemia    Hypertension    Hypertensive heart disease    IBS (irritable bowel syndrome)    Internal hemorrhoids    Kidney problem    Marfan syndrome    pt denies   Migraine    Obstructive sleep apnea    Osteoarthritis    Osteoporosis     Palpitations    Pneumonia    RA (rheumatoid arthritis) (HCC)    a. Followed by Dr. Balinda    Past Surgical History:  Procedure Laterality Date   ABDOMINAL AORTIC ANEURYSM REPAIR     CARDIAC CATHETERIZATION  2001   CARDIOVERSION N/A 04/04/2024   Procedure: CARDIOVERSION;  Surgeon: Perla Evalene PARAS, MD;  Location: [facility] ORS;  Service: Cardiovascular;  Laterality: N/A;   CESAREAN SECTION  1986 & 1991   CHOLECYSTECTOMY     COLONOSCOPY     COLONOSCOPY WITH PROPOFOL  N/A 01/19/2024   Procedure: COLONOSCOPY WITH PROPOFOL ;  Surgeon: Therisa Bi, MD;  Location: [facility] ENDOSCOPY;  Service: Gastroenterology;  Laterality: N/A;   ESOPHAGOGASTRODUODENOSCOPY (EGD) WITH PROPOFOL  N/A 05/08/2020   Procedure: ESOPHAGOGASTRODUODENOSCOPY (EGD) WITH PROPOFOL ;  Surgeon: Janalyn Keene NOVAK, MD;  Location: [facility] ENDOSCOPY;  Service: Endoscopy;  Laterality: N/A;   FRACTURE SURGERY Right    shoulder replacement   HEMORRHOID BANDING     ORIF DISTAL RADIUS FRACTURE Left    POLYPECTOMY  01/19/2024   Procedure: POLYPECTOMY;  Surgeon: Therisa Bi, MD;  Location: [facility] ENDOSCOPY;  Service: Gastroenterology;;   UPPER GASTROINTESTINAL ENDOSCOPY      Family History  Problem Relation Age of Onset   Hypertension Mother    Depression Mother    Osteoporosis Mother  Stroke Mother    Irritable bowel syndrome Mother    Asthma Mother    Heart disease Mother    Thyroid  disease Mother    Anxiety disorder Mother    Other Father 9   Arthritis Sister    Diabetes Sister    Heart disease Sister    Asthma Sister    Migraines Sister    Diabetes Sister    Nephrolithiasis Sister    Osteoporosis Sister    Arthritis Sister    Asthma Sister    Migraines Sister    Stroke Maternal Grandmother    Colon cancer Maternal Grandmother    Lymphoma Maternal Grandmother    Migraines Maternal Grandmother    Lung cancer Maternal Grandfather    Melanoma Maternal Grandfather    Aneurysm Paternal Grandmother        brain   Diabetes  Paternal Grandmother    Arthritis Paternal Grandmother    Arthritis Paternal Grandfather    Diabetes Paternal Grandfather    Diabetes Brother    Other Brother        prediabeties   Asthma Brother    Migraines Brother    Breast cancer Neg Hx    Social History:  reports that she quit smoking about 45 years ago. Her smoking use included cigarettes. She started smoking about 47 years ago. She has a 1.1 pack-year smoking history. She has never used smokeless tobacco. She reports current alcohol use. She reports that she does not use drugs.  Allergies:  Allergies  Allergen Reactions   Drysol [Aluminum Chloride] Itching   Fosamax [Alendronate Sodium] Other (See Comments)    GI upset   Ginzing [Ginseng] Other (See Comments)    Hyperactivity Jittery   Hydrocodone  Itching   Imitrex [Sumatriptan] Other (See Comments)    Contraindicated w/ heart condition   Lortab [Hydrocodone -Acetaminophen ] Itching   Neurontin  [Gabapentin ] Other (See Comments)    Unknown reaction   Peanut (Diagnostic) Other (See Comments)    Migraine    Roxicodone  [Oxycodone ] Itching   Ultram  [Tramadol ] Itching   Prednisone Rash   Tape Itching    Medications Prior to Admission  Medication Sig Dispense Refill   acetaminophen  (TYLENOL ) 500 MG tablet Take 500 mg by mouth every 6 (six) hours as needed for moderate pain (pain score 4-6), fever or headache.     ALPRAZolam  (XANAX ) 0.5 MG tablet Take 1 tablet (0.5 mg total) by mouth daily as needed for anxiety. 30 tablet 0   aspirin  EC 81 MG tablet Take 81 mg by mouth at bedtime.     Calcium  Carbonate (CALCIUM  600 PO) Take 600 mg by mouth daily.     carvedilol  (COREG ) 12.5 MG tablet Take 18.75 mg by mouth 2 (two) times daily with a meal.     cetirizine  (ZYRTEC ) 10 MG tablet Take 1 tablet (10 mg total) by mouth daily as needed for allergies. 90 tablet 3   dapagliflozin  propanediol (FARXIGA ) 10 MG TABS tablet Take 1 tablet (10 mg total) by mouth daily before breakfast. 30  tablet 3   ELIQUIS  5 MG TABS tablet TAKE ONE TABLET BY MOUTH TWICE A DAY 180 tablet 3   ezetimibe  (ZETIA ) 10 MG tablet TAKE 1 TABLET EVERY DAY 90 tablet 3   furosemide  (LASIX ) 20 MG tablet Take 2 tablets (40 mg total) by mouth daily. 180 tablet 3   isosorbide  mononitrate (IMDUR ) 30 MG 24 hr tablet TAKE 1/2 TABLET EVERY DAY 45 tablet 3   lisinopril  (ZESTRIL ) 5 MG tablet Take  0.5 tablets (2.5 mg total) by mouth daily. 90 tablet 3   magnesium  gluconate (MAGONATE) 500 MG tablet Take 500 mg by mouth at bedtime.     Multiple Vitamins-Minerals (VITAFUSION MULTI WOMENS) CHEW Chew 1 each by mouth 2 (two) times daily.     nitroGLYCERIN  (NITROSTAT ) 0.4 MG SL tablet Place 1 tablet (0.4 mg total) under the tongue every 5 (five) minutes as needed for chest pain. 25 tablet 3   omeprazole  (PRILOSEC) 40 MG capsule TAKE 1 CAPSULE EVERY DAY 90 capsule 3   OVER THE COUNTER MEDICATION Take 1 each by mouth 2 (two) times daily. GOLI SF prebiotic/probiotic gummy     OVER THE COUNTER MEDICATION Take 1 each by mouth 2 (two) times daily. GOLI SF Ashwagandha gummies     OVER THE COUNTER MEDICATION Take 2 each by mouth 2 (two) times daily. GOLI SF Apple Cider Vinegar gummies     OVER THE COUNTER MEDICATION Take 1 each by mouth daily. Ollie prebiotic/probiotic gummy     spironolactone  (ALDACTONE ) 25 MG tablet Take 1 tablet (25 mg total) by mouth daily. 90 tablet 3   TART CHERRY PO Take 3,000 mg by mouth 2 (two) times daily.     venlafaxine  XR (EFFEXOR -XR) 150 MG 24 hr capsule TAKE ONE CAPSULE BY MOUTH ONE TIME DAILY WITH BREAKFAST 90 capsule 3   venlafaxine  XR (EFFEXOR -XR) 37.5 MG 24 hr capsule TAKE ONE CAPSULE BY MOUTH ONE TIME DAILY WITH BREAKFAST 90 capsule 3   Vitamin D -Vitamin K (VITAMIN K2-VITAMIN D3 PO) Take 1 tablet by mouth daily.     albuterol  (VENTOLIN  HFA) 108 (90 Base) MCG/ACT inhaler Inhale 1-2 puffs into the lungs every 6 (six) hours as needed for wheezing or shortness of breath. 3 each 3   cyclobenzaprine   (FLEXERIL ) 10 MG tablet Take 0.5-1 tablets (5-10 mg total) by mouth 3 (three) times daily as needed. For headache 90 tablet 3   fluticasone  (FLONASE ) 50 MCG/ACT nasal spray Place 2 sprays into both nostrils daily as needed for allergies or rhinitis. 48 g 3   promethazine  (PHENERGAN ) 25 MG tablet Take 0.5 tablets (12.5 mg total) by mouth every 8 (eight) hours as needed for nausea. 90 tablet 3   Spacer/Aero-Holding Chambers (COMPACT SPACE CHAMBER) DEVI USE WITH INHALER 1 each 0    No results found. However, due to the size of the patient record, not all encounters were searched. Please check Results Review for a complete set of results. No results found.  Pertinent items noted in HPI and remainder of comprehensive ROS otherwise negative.  Blood pressure 131/74, pulse 76, temperature 97.8 F (36.6 C), temperature source Oral, resp. rate 18, height 5' 5 (1.651 m), weight 97.8 kg, SpO2 96%.  Patient is awake and alert.  She is oriented and appropriate.  Speech is fluent.  Judgment insight are intact.  Cranial nerve function normal bilaterally motor examination reveals mild weakness of dorsiflexion in her left foot grading out of 4/5.  She has some diminished sensation along the dorsum of her left foot.  The remainder of her neurologic exam is unremarkable.  Examination head ears eyes nose and throat is unremarked.  Chest and abdomen are benign.  Extremities are free of major deformity. Assessment/Plan Left peroneal nerve entrapment.  Plan left peroneal nerve decompression.  Risks and benefits been explained.  Patient wishes proceed.  Jessey Huyett A Johnathon Olden 07/18/2024, 8:00 AM

## 2024-07-18 NOTE — Discharge Instructions (Signed)
 Wound Care Keep incision covered and dry until post op day 3. You may remove the Honeycomb dressing on post op day 3. Do not put any creams, lotions, or ointments on incision. You are fine to shower. Let water run over incision and pat dry.  Activity Walk each and every day, increasing distance each day. No driving for 2 weeks or while still taking pain medication; may ride as a passenger locally.  Diet Resume your normal diet.   Return to Work Will be discussed at your follow up appointment.  Call Your Doctor If Any of These Occur Redness, drainage, or swelling at the wound.  Temperature greater than 101 degrees. Severe pain not relieved by pain medication. Incision starts to come apart.  Follow Up Appt Call [phone] today for appointment in about 2 weeks if you don't already have one or for any problems.

## 2024-07-18 NOTE — Progress Notes (Signed)
Patient alert and oriented, mae's well, voiding adequate amount of urine, swallowing without difficulty, no c/o pain at time of discharge. Patient discharged home with family. Script and discharged instructions given to patient. Patient and family stated understanding of instructions given. Patient has an appointment with Dr. Pool  

## 2024-07-18 NOTE — Anesthesia Postprocedure Evaluation (Signed)
 Anesthesia Post Note  Patient: Therapist, sports  Procedure(s) Performed: PERONEAL NERVE DECOMPRESSION (Left)     Patient location during evaluation: PACU Anesthesia Type: General Level of consciousness: awake and alert Pain management: pain level controlled Vital Signs Assessment: post-procedure vital signs reviewed and stable Respiratory status: spontaneous breathing, nonlabored ventilation, respiratory function stable and patient connected to nasal cannula oxygen  Cardiovascular status: stable and blood pressure returned to baseline Anesthetic complications: no   No notable events documented.  Last Vitals:  Vitals:   07/18/24 1015 07/18/24 1030  BP: (!) 95/49 (!) 103/47  Pulse: 70 68  Resp: 16 15  Temp:    SpO2: 94% 93%    Last Pain:  Vitals:   07/18/24 1015  TempSrc:   PainSc: 0-No pain                 Debby FORBES Like

## 2024-07-18 NOTE — Plan of Care (Signed)
   Problem: Education: Goal: Knowledge of General Education information will improve Description: Including pain rating scale, medication(s)/side effects and non-pharmacologic comfort measures Outcome: Completed/Met   Problem: Health Behavior/Discharge Planning: Goal: Ability to manage health-related needs will improve Outcome: Completed/Met   Problem: Clinical Measurements: Goal: Ability to maintain clinical measurements within normal limits will improve Outcome: Completed/Met Goal: Will remain free from infection Outcome: Completed/Met Goal: Diagnostic test results will improve Outcome: Completed/Met Goal: Respiratory complications will improve Outcome: Completed/Met Goal: Cardiovascular complication will be avoided Outcome: Completed/Met   Problem: Activity: Goal: Risk for activity intolerance will decrease Outcome: Completed/Met   Problem: Nutrition: Goal: Adequate nutrition will be maintained Outcome: Completed/Met   Problem: Coping: Goal: Level of anxiety will decrease Outcome: Completed/Met   Problem: Elimination: Goal: Will not experience complications related to bowel motility Outcome: Completed/Met Goal: Will not experience complications related to urinary retention Outcome: Completed/Met   Problem: Pain Managment: Goal: General experience of comfort will improve and/or be controlled Outcome: Completed/Met   Problem: Safety: Goal: Ability to remain free from injury will improve Outcome: Completed/Met   Problem: Skin Integrity: Goal: Risk for impaired skin integrity will decrease Outcome: Completed/Met   Problem: Education: Goal: Ability to verbalize activity precautions or restrictions will improve Outcome: Completed/Met Goal: Knowledge of the prescribed therapeutic regimen will improve Outcome: Completed/Met Goal: Understanding of discharge needs will improve Outcome: Completed/Met   Problem: Activity: Goal: Ability to avoid complications of  mobility impairment will improve Outcome: Completed/Met Goal: Ability to tolerate increased activity will improve Outcome: Completed/Met Goal: Will remain free from falls Outcome: Completed/Met   Problem: Bowel/Gastric: Goal: Gastrointestinal status for postoperative course will improve Outcome: Completed/Met   Problem: Clinical Measurements: Goal: Ability to maintain clinical measurements within normal limits will improve Outcome: Completed/Met Goal: Postoperative complications will be avoided or minimized Outcome: Completed/Met Goal: Diagnostic test results will improve Outcome: Completed/Met   Problem: Pain Management: Goal: Pain level will decrease Outcome: Completed/Met   Problem: Skin Integrity: Goal: Will show signs of wound healing Outcome: Completed/Met   Problem: Health Behavior/Discharge Planning: Goal: Identification of resources available to assist in meeting health care needs will improve Outcome: Completed/Met   Problem: Bladder/Genitourinary: Goal: Urinary functional status for postoperative course will improve Outcome: Completed/Met

## 2024-07-19 ENCOUNTER — Encounter (HOSPITAL_COMMUNITY): Payer: Self-pay | Admitting: Neurosurgery

## 2024-07-19 ENCOUNTER — Encounter

## 2024-07-21 ENCOUNTER — Other Ambulatory Visit: Payer: Self-pay | Admitting: Cardiovascular Disease

## 2024-07-23 ENCOUNTER — Encounter

## 2024-07-24 ENCOUNTER — Ambulatory Visit (INDEPENDENT_AMBULATORY_CARE_PROVIDER_SITE_OTHER): Payer: Medicare HMO | Admitting: Psychology

## 2024-07-24 ENCOUNTER — Encounter (HOSPITAL_COMMUNITY): Payer: Self-pay

## 2024-07-24 ENCOUNTER — Ambulatory Visit (HOSPITAL_COMMUNITY): Admit: 2024-07-24 | Admitting: Cardiology

## 2024-07-24 DIAGNOSIS — F331 Major depressive disorder, recurrent, moderate: Secondary | ICD-10-CM

## 2024-07-24 SURGERY — ATRIAL FIBRILLATION ABLATION
Anesthesia: General

## 2024-07-24 NOTE — Progress Notes (Signed)
 [facility] Counselor/Therapist Progress Note  Patient ID: EMRI SAMPLE, MRN: [id number],    Date: 07/24/2024  Time Spent: 10:00am-10:50am  50 minutes   Treatment Type: Individual Therapy  Reported Symptoms: stress, overwhelm  Mental Status Exam: Appearance:  Casual     Behavior: Appropriate  Motor: Normal  Speech/Language:  Normal Rate  Affect: Appropriate  Mood: normal  Thought process: normal  Thought content:   WNL  Sensory/Perceptual disturbances:   WNL  Orientation: oriented to person, place, time/date, and situation  Attention: Good  Concentration: Good  Memory: WNL  Fund of knowledge:  Good  Insight:   Good  Judgment:  Good  Impulse Control: Good   Risk Assessment: Danger to Self:  No Self-injurious Behavior: No Danger to Others: No Duty to Warn:no Physical Aggression / Violence:No  Access to Firearms a concern: No  Gang Involvement:No   Subjective: Pt present for face-to-face individual therapy via video.  Pt consents to telehealth video session and is aware of limitations and benefits of virtual sessions.   Location of pt: home Location of therapist: home office.   Pt talked about her health.  She had a day surgery procedure on the nerve at her knee last week.  Pt is able to walk now and is healing well.   Pt talked about her husband [name].  [name] fell 3 times yesterday.  Pt gets overwhelmed with care giving for [name].  Addressed the stress of caregiving for [name].   Pt gets frustrated with [name] cognitive challenges.  Addressed how this impacts pt.  Pt worries about how [name] will progress.  Worked on Optician, dispensing.  Pt and [name] will be celebrating their 55 year wedding anniversary in a couple of weeks.  They plan to go out of town for the weekend.   Pt talked about [name] brother being in the ICU in the hospital.   He has a serious infection that is systemic.   Addressed pt's concerns about family.   Worked on self care  strategies. Provided supportive therapy.    Interventions: Cognitive Behavioral Therapy and Insight-Oriented  Diagnosis:  F33.1   Plan of Care: Recommend ongoing therapy.   Pt participated in setting treatment goals.   Pt needs a place to talk about stressors.  Plan to meet every week.  Pt agrees with treatment plan.    Treatment Plan (target date: 03/27/2025) Client Abilities/Strengths  Pt is bright, engaging, and motivated for therapy.   Client Treatment Preferences  Individual therapy.  Client Statement of Needs  Improve coping skills.  Have a place to talk about stressors.   Symptoms  Depressed or irritable mood. Unresolved grief issues.  Problems Addressed  Unipolar Depression Goals 1. Alleviate depressive symptoms and return to previous level of effective functioning. 2. Appropriately grieve the loss in order to normalize mood and to return to previously adaptive level of functioning. Objective Learn and implement behavioral strategies to overcome depression. Target Date: 2025-03-27 Frequency: weekly  Progress: 45 Modality: individual  Related Interventions Engage the client in behavioral activation, increasing his/her activity level and contact with sources of reward, while identifying processes that inhibit activation.  Use behavioral techniques such as instruction, rehearsal, role-playing, role reversal, as needed, to facilitate activity in the client's daily life; reinforce success. Assist the client in developing skills that increase the likelihood of deriving pleasure from behavioral activation (e.g., assertiveness skills, developing an exercise plan, less internal/more external focus, increased social involvement); reinforce success. Objective Identify important people  in life, past and present, and describe the quality, good and poor, of those relationships. Target Date: 2025-03-27 Frequency: weekly  Progress: 45 Modality: individual  Related Interventions Conduct  Interpersonal Therapy beginning with the assessment of the client's interpersonal inventory of important past and present relationships; develop a case formulation linking depression to grief, interpersonal role disputes, role transitions, and/or interpersonal deficits). Objective Learn and implement problem-solving and decision-making skills. Target Date: 2025-03-27 Frequency: weekly  Progress: 45 Modality: individual  Related Interventions Conduct Problem-Solving Therapy using techniques such as psychoeducation, modeling, and role-playing to teach client problem-solving skills (i.e., defining a problem specifically, generating possible solutions, evaluating the pros and cons of each solution, selecting and implementing a plan of action, evaluating the efficacy of the plan, accepting or revising the plan); role-play application of the problem-solving skill to a real life issue. Encourage in the client the development of a positive problem orientation in which problems and solving them are viewed as a natural part of life and not something to be feared, despaired, or avoided. 3. Develop healthy interpersonal relationships that lead to the alleviation and help prevent the relapse of depression. 4. Develop healthy thinking patterns and beliefs about self, others, and the world that lead to the alleviation and help prevent the relapse of depression. 5. Recognize, accept, and cope with feelings of depression. Diagnosis F33.1  Conditions For Discharge Achievement of treatment goals and objectives   Veva Alma, LCSW

## 2024-07-25 ENCOUNTER — Encounter

## 2024-07-30 ENCOUNTER — Ambulatory Visit

## 2024-07-31 ENCOUNTER — Ambulatory Visit: Payer: Medicare HMO | Admitting: Psychology

## 2024-07-31 DIAGNOSIS — F331 Major depressive disorder, recurrent, moderate: Secondary | ICD-10-CM | POA: Diagnosis not present

## 2024-07-31 NOTE — Progress Notes (Signed)
 [facility] Counselor/Therapist Progress Note  Patient ID: Rachel Torres, MRN: [id number],    Date: 07/31/2024  Time Spent: 10:00am-10:50am  50 minutes   Treatment Type: Individual Therapy  Reported Symptoms: stress, overwhelm  Mental Status Exam: Appearance:  Casual     Behavior: Appropriate  Motor: Normal  Speech/Language:  Normal Rate  Affect: Appropriate  Mood: normal  Thought process: normal  Thought content:   WNL  Sensory/Perceptual disturbances:   WNL  Orientation: oriented to person, place, time/date, and situation  Attention: Good  Concentration: Good  Memory: WNL  Fund of knowledge:  Good  Insight:   Good  Judgment:  Good  Impulse Control: Good   Risk Assessment: Danger to Self:  No Self-injurious Behavior: No Danger to Others: No Duty to Warn:no Physical Aggression / Violence:No  Access to Firearms a concern: No  Gang Involvement:No   Subjective: Pt present for face-to-face individual therapy via video.  Pt consents to telehealth video session and is aware of limitations and benefits of virtual sessions.   Location of pt: home Location of therapist: home office.   Pt talked about it being a rough week.  She states [name] dementia has been worse bc of a medication adjustment.  Pt is going to talk to the psychiatrist to get medication for [name] that may help the dementia.  Pt hasn't been having as much care giving help which has been more stressful.  Pt states she tries to not fuss at [name] but it is hard when he is so noncompliant with safety issues like using his walker.   Pt gets overwhelmed with care giving for [name].  Pt was tearful as she talked about how hard care giving is.   Pt gets frustrated with [name] cognitive challenges.  Addressed how this impacts pt.   Worked on Optician, dispensing.  Pt talked about her health.  Her knee is recovering from surgery.   She has had some issues sleeping at times.  Worked on sleep strategies.  Worked on  self care strategies. Provided supportive therapy.    Interventions: Cognitive Behavioral Therapy and Insight-Oriented  Diagnosis:  F33.1   Plan of Care: Recommend ongoing therapy.   Pt participated in setting treatment goals.   Pt needs a place to talk about stressors.  Plan to meet every week.  Pt agrees with treatment plan.    Treatment Plan (target date: 03/27/2025) Client Abilities/Strengths  Pt is bright, engaging, and motivated for therapy.   Client Treatment Preferences  Individual therapy.  Client Statement of Needs  Improve coping skills.  Have a place to talk about stressors.   Symptoms  Depressed or irritable mood. Unresolved grief issues.  Problems Addressed  Unipolar Depression Goals 1. Alleviate depressive symptoms and return to previous level of effective functioning. 2. Appropriately grieve the loss in order to normalize mood and to return to previously adaptive level of functioning. Objective Learn and implement behavioral strategies to overcome depression. Target Date: 2025-03-27 Frequency: weekly  Progress: 45 Modality: individual  Related Interventions Engage the client in behavioral activation, increasing his/her activity level and contact with sources of reward, while identifying processes that inhibit activation.  Use behavioral techniques such as instruction, rehearsal, role-playing, role reversal, as needed, to facilitate activity in the client's daily life; reinforce success. Assist the client in developing skills that increase the likelihood of deriving pleasure from behavioral activation (e.g., assertiveness skills, developing an exercise plan, less internal/more external focus, increased social involvement); reinforce success. Objective Identify important  people in life, past and present, and describe the quality, good and poor, of those relationships. Target Date: 2025-03-27 Frequency: weekly  Progress: 45 Modality: individual  Related  Interventions Conduct Interpersonal Therapy beginning with the assessment of the client's interpersonal inventory of important past and present relationships; develop a case formulation linking depression to grief, interpersonal role disputes, role transitions, and/or interpersonal deficits). Objective Learn and implement problem-solving and decision-making skills. Target Date: 2025-03-27 Frequency: weekly  Progress: 45 Modality: individual  Related Interventions Conduct Problem-Solving Therapy using techniques such as psychoeducation, modeling, and role-playing to teach client problem-solving skills (i.e., defining a problem specifically, generating possible solutions, evaluating the pros and cons of each solution, selecting and implementing a plan of action, evaluating the efficacy of the plan, accepting or revising the plan); role-play application of the problem-solving skill to a real life issue. Encourage in the client the development of a positive problem orientation in which problems and solving them are viewed as a natural part of life and not something to be feared, despaired, or avoided. 3. Develop healthy interpersonal relationships that lead to the alleviation and help prevent the relapse of depression. 4. Develop healthy thinking patterns and beliefs about self, others, and the world that lead to the alleviation and help prevent the relapse of depression. 5. Recognize, accept, and cope with feelings of depression. Diagnosis F33.1  Conditions For Discharge Achievement of treatment goals and objectives   Veva Alma, LCSW

## 2024-08-02 ENCOUNTER — Ambulatory Visit

## 2024-08-06 ENCOUNTER — Ambulatory Visit: Attending: Orthopedic Surgery

## 2024-08-06 DIAGNOSIS — M542 Cervicalgia: Secondary | ICD-10-CM | POA: Diagnosis present

## 2024-08-06 DIAGNOSIS — R293 Abnormal posture: Secondary | ICD-10-CM | POA: Insufficient documentation

## 2024-08-06 DIAGNOSIS — M25612 Stiffness of left shoulder, not elsewhere classified: Secondary | ICD-10-CM | POA: Diagnosis not present

## 2024-08-06 DIAGNOSIS — M5459 Other low back pain: Secondary | ICD-10-CM | POA: Insufficient documentation

## 2024-08-06 DIAGNOSIS — M6281 Muscle weakness (generalized): Secondary | ICD-10-CM | POA: Diagnosis not present

## 2024-08-06 DIAGNOSIS — M25512 Pain in left shoulder: Secondary | ICD-10-CM | POA: Diagnosis not present

## 2024-08-06 DIAGNOSIS — R278 Other lack of coordination: Secondary | ICD-10-CM | POA: Insufficient documentation

## 2024-08-06 DIAGNOSIS — R2689 Other abnormalities of gait and mobility: Secondary | ICD-10-CM | POA: Diagnosis not present

## 2024-08-06 DIAGNOSIS — M25511 Pain in right shoulder: Secondary | ICD-10-CM | POA: Diagnosis not present

## 2024-08-06 DIAGNOSIS — G8929 Other chronic pain: Secondary | ICD-10-CM | POA: Insufficient documentation

## 2024-08-06 NOTE — Therapy (Signed)
 OUTPATIENT PHYSICAL THERAPY RE-EVALUATION      Patient Name: Rachel Torres MRN: [id number] DOB:June 18, 1956, 68 y.o., female Today's Date: 08/06/2024  END OF SESSION:  PT End of Session - 08/06/24 1108     Visit Number 47    Number of Visits 64    Date for PT Re-Evaluation 09/06/24    Authorization Type [employer]  Medicare HMO    Authorization Time Period 01/26/24-03/22/24    PT Start Time 1102    PT Stop Time 1145    PT Time Calculation (min) 43 min    Equipment Utilized During Treatment Gait belt    Activity Tolerance Patient tolerated treatment well;No increased pain    Behavior During Therapy WFL for tasks assessed/performed          Past Medical History:  Diagnosis Date   AAA (abdominal aortic aneurysm) (HCC)    a. Chronic w/o evidence of aneurysmal dil on CTA 01/2016.   Alcohol abuse    Allergy    Anemia    Anxiety    Asthma    Atrial fibrillation (HCC)    CAD (coronary artery disease)    a. 08/2010 s/p CABG x 1 (VG->RCA) @ time of Ao dissection repair; b. 01/2016 Lexiscan  MV: mid antsept/apical defect w/ ? peri-infarct ischemia-->likely attenuation-->Med Rx.   Chewing difficulty    Chronic combined systolic (congestive) and diastolic (congestive) heart failure (HCC)    a. 2012 EF 30-35%; b. 04/2015 EF 40-45%; c. 01/2016 Echo: Ef 55-65%, nl AoV bioprosthesis, nl RV, nl PASP.   Constipation    Depression    Edema of both lower extremities    Gallbladder sludge    GERD (gastroesophageal reflux disease)    Heart failure (HCC)    Heart valve problem    History of Bicuspid Aortic Valve    a. 08/2010 s/p AVR @ time of Ao dissection repair; b. 01/2016 Echo: Ef 55-65%, nl AoV bioprosthesis, nl RV, nl PASP.   Hx of repair of dissecting thoracic aortic aneurysm, Stanford type A    a. 08/2010 s/p repair with AVR and VG->RCA; b. 01/2016 CTA: stable appearance of Asc Thoracic Aortic graft. Opacification of flase lumen of chronic abd Ao dissection w/ retrograde flow through lumbar  arteries. No aneurysmal dil of flase lumen.   Hyperlipidemia    Hypertension    Hypertensive heart disease    IBS (irritable bowel syndrome)    Internal hemorrhoids    Kidney problem    Marfan syndrome    pt denies   Migraine    Obstructive sleep apnea    Osteoarthritis    Osteoporosis    Palpitations    Pneumonia    RA (rheumatoid arthritis) (HCC)    a. Followed by Dr. Balinda   Past Surgical History:  Procedure Laterality Date   ABDOMINAL AORTIC ANEURYSM REPAIR     CARDIAC CATHETERIZATION  2001   CARDIOVERSION N/A 04/04/2024   Procedure: CARDIOVERSION;  Surgeon: Perla Evalene PARAS, MD;  Location: [facility] ORS;  Service: Cardiovascular;  Laterality: N/A;   CESAREAN SECTION  1986 & 1991   CHOLECYSTECTOMY     COLONOSCOPY     COLONOSCOPY WITH PROPOFOL  N/A 01/19/2024   Procedure: COLONOSCOPY WITH PROPOFOL ;  Surgeon: Therisa Bi, MD;  Location: [facility] ENDOSCOPY;  Service: Gastroenterology;  Laterality: N/A;   ESOPHAGOGASTRODUODENOSCOPY (EGD) WITH PROPOFOL  N/A 05/08/2020   Procedure: ESOPHAGOGASTRODUODENOSCOPY (EGD) WITH PROPOFOL ;  Surgeon: Janalyn Keene NOVAK, MD;  Location: [facility] ENDOSCOPY;  Service: Endoscopy;  Laterality: N/A;   FRACTURE SURGERY  Right    shoulder replacement   HEMORRHOID BANDING     ORIF DISTAL RADIUS FRACTURE Left    PERONEAL NERVE DECOMPRESSION Left 07/18/2024   Procedure: PERONEAL NERVE DECOMPRESSION;  Surgeon: Louis Shove, MD;  Location: [facility] OR;  Service: Neurosurgery;  Laterality: Left;  Left peroneal nerve decompression   POLYPECTOMY  01/19/2024   Procedure: POLYPECTOMY;  Surgeon: Therisa Bi, MD;  Location: [facility] ENDOSCOPY;  Service: Gastroenterology;;   UPPER GASTROINTESTINAL ENDOSCOPY     Patient Active Problem List   Diagnosis Date Noted   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 07/18/2024   Pain in lower limb 07/06/2024   Entrapment of common peroneal nerve 07/06/2024   Typical atrial flutter (HCC) 04/04/2024   Adenomatous polyp of colon  01/19/2024   Heart failure with improved ejection fraction (HFimpEF) (HCC) 01/18/2024   Abnormal metabolism 11/14/2023   Weight regain after inital weight loss 11/14/2023   Multiple closed tarsal fractures of left foot, sequela 07/12/2023   Arm contusion, right, sequela 07/12/2023   Lumbar disc disease 07/12/2023   Sprain of shoulder 06/30/2023   Closed fracture of fifth metatarsal bone of left foot 06/30/2023   PAF (paroxysmal atrial fibrillation) (HCC) 06/09/2023   Paresthesia 06/03/2023   Obesity, Beginning BMI 35.45 02/17/2023   Tinnitus 01/14/2023   Change in vision 01/14/2023   Encounter for screening colonoscopy 01/14/2023   Personal history of fall 01/14/2023   Problem related to unspecified psychosocial circumstances 12/28/2022   Person encountering health services to consult on behalf of another person 12/28/2022   Palpitation 12/10/2022   Fatigue 10/26/2022   B12 deficiency 10/26/2022   Right shoulder pain 09/15/2022   Depression 09/14/2022   Other constipation 05/12/2022   Medicare annual wellness visit, initial 07/17/2021   TMJ (dislocation of temporomandibular joint) 07/17/2021   Encounter for screening mammogram for breast cancer 06/05/2021   Diarrhea 04/11/2020   Epigastric pain 02/19/2020   Dark stools 02/19/2020   History of atrial fibrillation 02/05/2019   SOB (shortness of breath) 01/27/2019   Irregular surface of cornea 12/25/2018   Acute cough 09/08/2017   History of HPV infection 12/01/2016   Screening mammogram, encounter for 11/29/2016   Estrogen deficiency 11/29/2016   Encounter for routine gynecological examination 11/29/2016   Grade III hemorrhoids 10/06/2016   Tachycardia 08/14/2016   Hemorrhoids 08/13/2016   Atypical migraine 08/03/2016   CAD (coronary artery disease)    AAA (abdominal aortic aneurysm) (HCC)    Chronic combined systolic (congestive) and diastolic (congestive) heart failure (HCC)    Prediabetes 02/11/2016   Generalized  anxiety disorder 12/08/2015   Panic attacks 07/28/2015   Chest pain 07/09/2013   Sensorineural hearing loss 03/27/2013   Vitamin D  deficiency 07/03/2012   H/O aortic dissection 05/12/2012   Routine general medical examination at a health care facility 09/20/2011   Atypical chest pain 09/15/2011   S/P AVR (aortic valve replacement) 01/07/2011   CORONARY ARTERY BYPASS GRAFT, HX OF 01/07/2011   Arthritis 12/20/2010   Back pain 12/15/2010   H/O dissecting abdominal aortic aneurysm repair 08/24/2010   IRRITABLE BOWEL SYNDROME 09/09/2009   Obstructive sleep apnea 08/04/2009   External hemorrhoid 06/26/2009   Osteoporosis 01/09/2009   Hyperlipidemia 07/26/2007   Depression with anxiety 07/26/2007   Essential hypertension 07/26/2007   Allergic rhinitis 07/26/2007   Asthma 07/26/2007   GERD 07/26/2007   Osteoarthritis 07/26/2007   Ocular migraine 07/26/2007   Mild intermittent asthma without complication 07/26/2007    PCP: Randeen Laine LABOR MD  REFERRING PROVIDER: Lamar Hamming  REFERRING DIAG: L rotator cuff   THERAPY DIAG:  Other abnormalities of gait and mobility  Other low back pain  Muscle weakness (generalized)  Other lack of coordination  Abnormal posture  Stiffness of left shoulder, not elsewhere classified  Left shoulder pain, unspecified chronicity  Chronic right shoulder pain  Rationale for Evaluation and Treatment: Rehabilitation  ONSET DATE: Feb 2024  SUBJECTIVE:                                                                                                                                                                                      SUBJECTIVE STATEMENT:    Pt reports she feels as though the surgery has progressed well.  Pt reports no falls since being seen last, but did have her peroneal nerve decompression and has been responding well post surgical.  Hand dominance: Left  PERTINENT HISTORY: Rachel Torres is a 66yoF familiar to our  services. Pt referred to PT for strengthening of chronic left shoulder DJD, known remote RC injury. Recently fell and broke four metatarsals, since cleared from use of CAM rocker. She was treated successfully in past for right shoulder pain and has past medical history of Right shoulder surgery replacement. 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 PMH aorta type A dissection in sept 2011 s/p aortic valve replacement, bypass of R coronary artery, anemia, anxiety, CAD, CHF, depression, GERD, Heart failure, HLD, HTN, IBS, migraine, osteoarthritis, osteoporosis, RA, back pain.    Back pain:  Patient has new referral for radiculopathy, lumbar region. MRI showed degeneration of L1-2 and L2-3 with some disc space collapse and minor retrolisthesis of L2-3. Issue began in February, felt numbness in L thigh. No cause known, but she was at a museum and noticed it was numb. Sometimes L foot numbness and toe numbness of 2nd and 3rd toe causing tripping. This caused her to trip July 4th and break five metatarsals. Some minor pain in the low pain noted. Pain noted to be 2-3/10 Pain in hip 3-4/10 of L hip.    PAIN:  Are you having pain? Yes, 3/10 pain in lateral deltoids in the shoulder.  PRECAUTIONS: None  FALLS:  Has patient fallen in last 6 months? Yes. Number of falls 2  PATIENT GOALS:to lift more than 3lb with LUE, be able to eat a meal without arm being tired.    OBJECTIVE:  DIAGNOSTIC FINDINGS:  IMPRESSION: Mild degenerative change in the cervical spine without acute fracture or traumatic subluxation.   IMPRESSION: Full width, full width tears of the supraspinatus and infraspinatus tendons with retraction to the glenoid. Grade 2/3 infraspinatus atrophy.   Moderate tendinosis  of the distal subscapularis tendon with articular sided fraying of the cephalad fibers.   Moderate intra-articular long head biceps tendinosis with fraying and possible partial tearing.   Mild glenohumeral and  moderate AC joint osteoarthritis.   Degenerative superior labral tearing anteriorly and posteriorly.   PATIENT SURVEYS:  Quick Dash 50%  and FOTO 46%   COGNITION: Overall cognitive status: Within functional limits for tasks assessed                                  SENSATION: WFL   POSTURE: Slight rounded shoulders and shoulder elevation    UPPER EXTREMITY ROM:    Active ROM Right eval 9/16: recert RUE Left eval 9/16 RECERT LUE  Shoulder flexion 64 122 91 92  Shoulder extension        Shoulder abduction 95 88 52 82  Shoulder adduction        Shoulder internal rotation Unable to test at 90 61 degrees 5 40  Shoulder external rotation 18at side of body -5 degrees at 90 degrees 34 35  Elbow flexion [facility]  WFL   Elbow extension [facility]  WFL   Wrist flexion        Wrist extension        Wrist ulnar deviation        Wrist radial deviation        Wrist pronation        Wrist supination        (Blank rows = not tested)   UPPER EXTREMITY MMT:   MMT Right eval Left eval  Shoulder flexion      Shoulder extension      Shoulder abduction      Shoulder adduction      Shoulder internal rotation      Shoulder external rotation      Middle trapezius      Lower trapezius      Elbow flexion      Elbow extension      Wrist flexion      Wrist extension      Wrist ulnar deviation      Wrist radial deviation      Wrist pronation      Wrist supination      Grip strength (lbs) 30 35  (Blank rows = not tested) Unable to test strength due to pain with motion   TRUNK ROM: Trunk Flexion 65  Trunk Extension 7  Trunk R SB Limited 25%  Trunk L SB WFL  Trunk R rotation WFL  Trunk L rotation Limited 25%    Ankle ROM Seated: Motion: AROM seated Right Left  Plantarflexion 4+ 3  Dorsiflexion 4+ 4  Inversion 4 3+  Eversion 4+ 3+    SHOULDER SPECIAL TESTS: Impingement tests:  unable to test due to pain in starting position SLAP lesions:  unable to test due to pain in starting  position Instability tests:  unable to test due to pain in starting position Rotator cuff assessment:  unable to test due to pain in starting position Biceps assessment:  unable to test due to pain in starting position   LOW BACK SPECIAL TEST: SLR: negative  Scour test: negative FABER: negative Pelvic compression/distraction: negative  Slump test positive LLE  6 min walk test: 1160 ft increased left foot drag and antalgic gait pattern with foot eversion    JOINT MOBILITY TESTING:  AP: painful PA: painful Inferior:  painful Distraction: reduces pain   Back mobilization hypomobile and painful to grade II mobilizations    PALPATION:  Tenderness to all joint regions of GH     TODAY'S TREATMENT: DATE: 08/06/24     Self Care Home Management:  Education provided on prognosis and updating current POC.  Pt is aware that she may need just a few visits and goals were updated to reflect the lck of strength in the ankle musculature.  Pt also educated on updating HEP to include ankle strengthening exercises.  Physical Performance Measures: 6 Min Walk Test:  Instructed patient to ambulate as quickly and as safely as possible for 6 minutes using LRAD. Patient was allowed to take standing rest breaks without stopping the test, but if the patient required a sitting rest break the clock would be stopped and the test would be over.  Results: 1202 feet (366 m) using no AD with CGA. Results indicate that the patient has reduced endurance with ambulation compared to age matched norms.  Age Matched Norms: 27-69 yo M: 83 F: 52, 12-79 yo M: 58 F: 471, 59-89 yo M: 417 F: 392 MDC: 58.21 meters (190.98 feet) or 50 meters (ANPTA Core Set of Outcome Measures for Adults with Neurologic Conditions, 2018)  Standing rest break provided after and vitals assessed: SpO2 97%, Pulse rate 88  PATIENT EDUCATION: Education details: goals, POC  Person educated: Patient Education method: Explanation,  Demonstration, Tactile cues, Verbal cues, and Handouts Education comprehension: verbalized understanding, returned demonstration, verbal cues required, and tactile cues required  HOME EXERCISE PROGRAM: Access Code: [email or web address] URL: [email or web address] Date: 05/21/2024 Prepared by: Marina  Moser  Exercises - Hooklying Transversus Abdominis Palpation  - 1 x daily - 7 x weekly - 2 sets - 10 reps - 5 hold - Supine March  - 1 x daily - 7 x weekly - 2 sets - 10 reps - 5 hold - Seated Ankle Alphabet  - 1 x daily - 7 x weekly - 2 sets - 1 reps - 5 hold - Standing 'L' Stretch at Counter  - 1 x daily - 7 x weekly - 2 sets - 2 reps - 30 hold  Access Code: [email or web address] URL: [email or web address] Date: 10/03/2023 Prepared by: Marina  Moser  Exercises - Seated Scapular Retraction  - 1 x daily - 7 x weekly - 2 sets - 10 reps - 5 hold - Neck Sidebending  - 1 x daily - 7 x weekly - 2 sets - 2 reps - 30 hold - Supine Isometric Shoulder Extension with Towel  - 1 x daily - 7 x weekly - 2 sets - 10 reps - 5 hold - Supine Isometric Shoulder Adduction with Towel Roll  - 1 x daily - 7 x weekly - 2 sets - 10 reps - 5 hold - Seated Bilateral Shoulder Flexion Towel Slide at Table Top  - 1 x daily - 7 x weekly - 1 sets - 3 reps - 30 hold - Supine Posterior Pelvic Tilt  - 1 x daily - 7 x weekly - 2 sets - 5 reps - 30 hold - Supine LE Neural Mobilization  - 1 x daily - 7 x weekly - 2 sets - 10 reps - 5 hold - Seated Ankle Alphabet  - 1 x daily - 7 x weekly - 2 sets - 1 reps - 5 hold  ASSESSMENT:  CLINICAL IMPRESSION:    Pt re-evaluated due to surgical procedure at this time.  Pt currently demonstrates good mobility in  the ankle and when performing goal assessment did not experience foot drop.  Pt ultimately found to have reduced plantarflexion when testing and will be addressed moving forward.  Anticipate pt needing a few visits before being discharged with HEP to improve overall tolerance  to exercises.   Pt will continue to benefit from skilled therapy to address remaining deficits in order to improve overall QoL and return to PLOF.      OBJECTIVE IMPAIRMENTS: cardiopulmonary status limiting activity, decreased activity tolerance, decreased mobility, increased fascial restrictions, impaired perceived functional ability, increased muscle spasms, impaired flexibility, impaired UE functional use, improper body mechanics, postural dysfunction, and pain.   ACTIVITY LIMITATIONS: carrying, lifting, bed mobility, bathing, dressing, self feeding, reach over head, and caring for others  PARTICIPATION LIMITATIONS: meal prep, cleaning, laundry, personal finances, interpersonal relationship, driving, shopping, community activity, yard work, church, and caregiving for husband  PERSONAL FACTORS: Age, Fitness, Past/current experiences, Time since onset of injury/illness/exacerbation, and 3+ comorbidities: aorta type A dissection in sept 2011 with aortic valve replacement bypass of R coronary artery, AAA, anemia, anxiety, CAD, CHF, depression, GERD, Heart failure, HLD, HTN, IBS, Kidney problems, migraine, osteoarthritis, osteoporosis, RA, ocular migraines, back pain are also affecting patient's functional outcome.   REHAB POTENTIAL: Fair    CLINICAL DECISION MAKING: Evolving/moderate complexity  EVALUATION COMPLEXITY: Moderate   GOALS: Goals reviewed with patient? Yes  SHORT TERM GOALS: Target date: 04/19/2024    Patient will be independent in home exercise program to improve strength/mobility for better functional independence with ADLs. Baseline:7/23: HEP given 9/16: compliant 1x/week 4/3: improving compliance 5/29: compliant Goal status: MET    LONG TERM GOALS: Target date: 09/06/24  Patient will increase FOTO score to equal to or greater than  57%   to demonstrate statistically significant improvement in mobility and quality of life.  Baseline: 7/22: 46 9/16: 50% 10/7: 40% 12/10:  57% Goal status: MET  2.  Patient will improve shoulder AROM to > 140 degrees of flexion, scaption, and abduction for improved ability to perform overhead activities. Baseline: 7/22: see above 9/16: see above 10/7 : defer to exacerbation 12/10: shoulder abduction >140, flexion < 140 , assess scaption next visit 2/6: flexion >140, abduction >140 scaption >140 Goal status: MET  3.  Patient will demonstrate adequate shoulder ROM and strength to be able to shave and dress independently with pain less than 3/10. Baseline: 7/23: 6/10 pain with dressing, limited ROM, unable to test strength 9/16: 3/10 pain with dressing  Goal status: MET  4.  Patient will decrease Quick DASH score by > 8 points demonstrating reduced self-reported upper extremity disability for L and R shoulder  Baseline: 7/23: L shoulder 50% 9/16: R=54.54 % L 66% L 81% R 24% 12/10: R: 43.2% L: 56.8% 2/6: L 22%  R 34%  Goal status: MET  5.  Patient will be able to eat an entire meal without resting arm demonstrating improved functional strength Baseline: 7/23: has to rest while eating due to pain. 9/16: able to make halfway through meal 10/7: can make it about halfway through meal, some meals can get 3/4s through meal 12/10: improved, still has to rest every once in a while 2/6: can get halfway to 2/3rds of the meal. 5/29:  2/3rds of meal  Goal status: deferred at this time to focus on back/leg  6.  Patient will be able to stand 15 minutes without her LLE going numb allowing for ADLS such as showering and iADLs such as cooking Baseline: 5 minutes  12/10: will assess next visit 2/6: 10 minutes 4/3: 10 minutes 5/29: 5-8 minutes  7/24: 5 minutes- pt states that it is now in both of her legs 08/06/24: Pt reports she has not noticed any numbness in the feet Goal status: MET  7. Patient will increase six minute walk test distance to >1400 for progression to community ambulator and improve gait ability Baseline: 1160 ft  12/10: 1010 ft 2/6:  1140 ft  4/3: 1201 ft 5/29: 774 ft stopped with 1 min 35 seconds remaining due to shortness of breath 6/26: 911ft with one seated rest break at 1:40 seconds remaining due to SOB 7/24: 1110 ft- pt able to walk the entire 6 minutes without a break 08/06/24: 1202' with 2 standing rest breaks Goal status: UPDATED & IN PROGRESS  8  Patient will reduce modified Oswestry score to <20 as to demonstrate minimal disability with ADLs including improved sleeping tolerance, walking/sitting tolerance etc for better mobility with ADLs.  Baseline: 2/6: 60%  4/3: 50%  7/24: 22/50 = 44% 08/06/24: 25/50: 50% Goal status: IN PROGRESS  9  Patient will improve df strength by 2lb for improved functional ambulation with decreased foot drag  Baseline: 5/29: L 4.2 R 6.9 7/24: L 4-/5 R 4+/5 08/06/24: R: 11.93 peak; 7.9 average, L: 12.6 peak; 9.6 average Goal status: MET   PLAN:  PT FREQUENCY: 2x/week  PT DURATION: 8 weeks  PLANNED INTERVENTIONS: Therapeutic exercises, Therapeutic activity, Neuromuscular re-education, Balance training, Gait training, Patient/Family education, Self Care, Joint mobilization, Vestibular training, Canalith repositioning, Visual/preceptual remediation/compensation, Dry Needling, Electrical stimulation, Spinal mobilization, Cryotherapy, Moist heat, Compression bandaging, scar mobilization, Splintting, Taping, Traction, Ultrasound, Biofeedback, Ionotophoresis 4mg /ml Dexamethasone , Manual therapy, and Re-evaluation  PLAN FOR NEXT SESSION:   Continue with LUE strengthening  L ankle strengthening, distraction, core activation, reassess scaption and flexion ROM, make HEP     Fonda LELON Simpers PT ,DPT Physical Therapist- [place]  [facility]  08/06/24, 1:16 PM

## 2024-08-07 ENCOUNTER — Ambulatory Visit (INDEPENDENT_AMBULATORY_CARE_PROVIDER_SITE_OTHER): Payer: Medicare HMO | Admitting: Psychology

## 2024-08-07 DIAGNOSIS — F331 Major depressive disorder, recurrent, moderate: Secondary | ICD-10-CM

## 2024-08-07 NOTE — Progress Notes (Signed)
 [facility] Counselor/Therapist Progress Note  Patient ID: Rachel Torres, MRN: [id number],    Date: 08/07/2024  Time Spent: 10:00am-10:50am  50 minutes   Treatment Type: Individual Therapy  Reported Symptoms: stress, overwhelm  Mental Status Exam: Appearance:  Casual     Behavior: Appropriate  Motor: Normal  Speech/Language:  Normal Rate  Affect: Appropriate  Mood: normal  Thought process: normal  Thought content:   WNL  Sensory/Perceptual disturbances:   WNL  Orientation: oriented to person, place, time/date, and situation  Attention: Good  Concentration: Good  Memory: WNL  Fund of knowledge:  Good  Insight:   Good  Judgment:  Good  Impulse Control: Good   Risk Assessment: Danger to Self:  No Self-injurious Behavior: No Danger to Others: No Duty to Warn:no Physical Aggression / Violence:No  Access to Firearms a concern: No  Gang Involvement:No   Subjective: Pt present for face-to-face individual therapy via video.  Pt consents to telehealth video session and is aware of limitations and benefits of virtual sessions.   Location of pt: home Location of therapist: home office.   Pt talked about planning to go out of town with Rachel Torres this weekend.  Pt is nervous about it bc she will be care giving for Rachel Torres by herself.  Addressed pt's concerns.   Pt hasn't been having as much care giving help which has been more stressful.  Pt states she tries to not fuss at Rachel Torres but it is hard when he is so noncompliant with safety issues like using his walker.   Pt talked about trying to work on [employer] but is feeling overwhelmed.  Helped pt problem solve about her budgeting project.   Worked on breaking tasks down into small steps.   Pt states she feels overwhelmed in her head and that makes it hard to organize her environment.   Encouraged pt to journal a thought dump.   Pt gets overwhelmed with care giving for Rachel Torres.  Pt gets frustrated with Rachel Torres's cognitive  challenges.  Addressed how this impacts pt.   Worked on [employer], dispensing.  Pt talked about her health.  She has had some issues sleeping at times.  Worked on sleep strategies.  Worked on self care strategies. Provided supportive therapy.    Interventions: Cognitive Behavioral Therapy and Insight-Oriented  Diagnosis:  F33.1   Plan of Care: Recommend ongoing therapy.   Pt participated in setting treatment goals.   Pt needs a place to talk about stressors.  Plan to meet every week.  Pt agrees with treatment plan.    Treatment Plan (target date: 03/27/2025) Client Abilities/Strengths  Pt is bright, engaging, and motivated for therapy.   Client Treatment Preferences  Individual therapy.  Client Statement of Needs  Improve coping skills.  Have a place to talk about stressors.   Symptoms  Depressed or irritable mood. Unresolved grief issues.  Problems Addressed  Unipolar Depression Goals 1. Alleviate depressive symptoms and return to previous level of effective functioning. 2. Appropriately grieve the loss in order to normalize mood and to return to previously adaptive level of functioning. Objective Learn and implement behavioral strategies to overcome depression. Target Date: 2025-03-27 Frequency: weekly  Progress: 45 Modality: individual  Related Interventions Engage the client in behavioral activation, increasing his/her activity level and contact with sources of reward, while identifying processes that inhibit activation.  Use behavioral techniques such as instruction, rehearsal, role-playing, role reversal, as needed, to facilitate activity in the client's daily life; reinforce success. Assist  the client in developing skills that increase the likelihood of deriving pleasure from behavioral activation (e.g., assertiveness skills, developing an exercise plan, less internal/more external focus, increased social involvement); reinforce success. Objective Identify important people in  life, past and present, and describe the quality, good and poor, of those relationships. Target Date: 2025-03-27 Frequency: weekly  Progress: 45 Modality: individual  Related Interventions Conduct Interpersonal Therapy beginning with the assessment of the client's interpersonal inventory of important past and present relationships; develop a case formulation linking depression to grief, interpersonal role disputes, role transitions, and/or interpersonal deficits). Objective Learn and implement problem-solving and decision-making skills. Target Date: 2025-03-27 Frequency: weekly  Progress: 45 Modality: individual  Related Interventions Conduct Problem-Solving Therapy using techniques such as psychoeducation, modeling, and role-playing to teach client problem-solving skills (i.e., defining a problem specifically, generating possible solutions, evaluating the pros and cons of each solution, selecting and implementing a plan of action, evaluating the efficacy of the plan, accepting or revising the plan); role-play application of the problem-solving skill to a real life issue. Encourage in the client the development of a positive problem orientation in which problems and solving them are viewed as a natural part of life and not something to be feared, despaired, or avoided. 3. Develop healthy interpersonal relationships that lead to the alleviation and help prevent the relapse of depression. 4. Develop healthy thinking patterns and beliefs about self, others, and the world that lead to the alleviation and help prevent the relapse of depression. 5. Recognize, accept, and cope with feelings of depression. Diagnosis F33.1  Conditions For Discharge Achievement of treatment goals and objectives   Veva Alma, LCSW

## 2024-08-09 ENCOUNTER — Ambulatory Visit

## 2024-08-09 DIAGNOSIS — M542 Cervicalgia: Secondary | ICD-10-CM

## 2024-08-09 DIAGNOSIS — R293 Abnormal posture: Secondary | ICD-10-CM | POA: Diagnosis not present

## 2024-08-09 DIAGNOSIS — R2689 Other abnormalities of gait and mobility: Secondary | ICD-10-CM

## 2024-08-09 DIAGNOSIS — M25512 Pain in left shoulder: Secondary | ICD-10-CM

## 2024-08-09 DIAGNOSIS — M6281 Muscle weakness (generalized): Secondary | ICD-10-CM

## 2024-08-09 DIAGNOSIS — M25612 Stiffness of left shoulder, not elsewhere classified: Secondary | ICD-10-CM

## 2024-08-09 DIAGNOSIS — R278 Other lack of coordination: Secondary | ICD-10-CM | POA: Diagnosis not present

## 2024-08-09 DIAGNOSIS — G8929 Other chronic pain: Secondary | ICD-10-CM | POA: Diagnosis not present

## 2024-08-09 DIAGNOSIS — M5459 Other low back pain: Secondary | ICD-10-CM

## 2024-08-09 DIAGNOSIS — M25511 Pain in right shoulder: Secondary | ICD-10-CM | POA: Diagnosis not present

## 2024-08-09 NOTE — Therapy (Signed)
 OUTPATIENT PHYSICAL THERAPY TREATMENT      Patient Name: Rachel Torres MRN: [id number] DOB:[birth year], 68 y.o., female Today's Date: 08/09/2024  END OF SESSION:  PT End of Session - 08/09/24 1055     Visit Number 48    Number of Visits 64    Date for PT Re-Evaluation 09/06/24    Authorization Type [employer]  Medicare HMO    Authorization Time Period 01/26/24-03/22/24    PT Start Time 1100    PT Stop Time 1145    PT Time Calculation (min) 45 min    Equipment Utilized During Treatment Gait belt    Activity Tolerance Patient tolerated treatment well;No increased pain    Behavior During Therapy WFL for tasks assessed/performed           Past Medical History:  Diagnosis Date   AAA (abdominal aortic aneurysm) (HCC)    a. Chronic w/o evidence of aneurysmal dil on CTA 01/2016.   Alcohol abuse    Allergy    Anemia    Anxiety    Asthma    Atrial fibrillation (HCC)    CAD (coronary artery disease)    a. 08/2010 s/p CABG x 1 (VG->RCA) @ time of Ao dissection repair; b. 01/2016 Lexiscan  MV: mid antsept/apical defect w/ ? peri-infarct ischemia-->likely attenuation-->Med Rx.   Chewing difficulty    Chronic combined systolic (congestive) and diastolic (congestive) heart failure (HCC)    a. 2012 EF 30-35%; b. 04/2015 EF 40-45%; c. 01/2016 Echo: Ef 55-65%, nl AoV bioprosthesis, nl RV, nl PASP.   Constipation    Depression    Edema of both lower extremities    Gallbladder sludge    GERD (gastroesophageal reflux disease)    Heart failure (HCC)    Heart valve problem    History of Bicuspid Aortic Valve    a. 08/2010 s/p AVR @ time of Ao dissection repair; b. 01/2016 Echo: Ef 55-65%, nl AoV bioprosthesis, nl RV, nl PASP.   Hx of repair of dissecting thoracic aortic aneurysm, Stanford type A    a. 08/2010 s/p repair with AVR and VG->RCA; b. 01/2016 CTA: stable appearance of Asc Thoracic Aortic graft. Opacification of flase lumen of chronic abd Ao dissection w/ retrograde flow through lumbar  arteries. No aneurysmal dil of flase lumen.   Hyperlipidemia    Hypertension    Hypertensive heart disease    IBS (irritable bowel syndrome)    Internal hemorrhoids    Kidney problem    Marfan syndrome    pt denies   Migraine    Obstructive sleep apnea    Osteoarthritis    Osteoporosis    Palpitations    Pneumonia    RA (rheumatoid arthritis) (HCC)    a. Followed by Dr. Balinda   Past Surgical History:  Procedure Laterality Date   ABDOMINAL AORTIC ANEURYSM REPAIR     CARDIAC CATHETERIZATION  2001   CARDIOVERSION N/A 04/04/2024   Procedure: CARDIOVERSION;  Surgeon: Perla Evalene PARAS, MD;  Location: [facility] ORS;  Service: Cardiovascular;  Laterality: N/A;   CESAREAN SECTION  1986 & 1991   CHOLECYSTECTOMY     COLONOSCOPY     COLONOSCOPY WITH PROPOFOL  N/A 01/19/2024   Procedure: COLONOSCOPY WITH PROPOFOL ;  Surgeon: Therisa Bi, MD;  Location: [facility] ENDOSCOPY;  Service: Gastroenterology;  Laterality: N/A;   ESOPHAGOGASTRODUODENOSCOPY (EGD) WITH PROPOFOL  N/A 05/08/2020   Procedure: ESOPHAGOGASTRODUODENOSCOPY (EGD) WITH PROPOFOL ;  Surgeon: Janalyn Keene NOVAK, MD;  Location: [facility] ENDOSCOPY;  Service: Endoscopy;  Laterality: N/A;   FRACTURE  SURGERY Right    shoulder replacement   HEMORRHOID BANDING     ORIF DISTAL RADIUS FRACTURE Left    PERONEAL NERVE DECOMPRESSION Left 07/18/2024   Procedure: PERONEAL NERVE DECOMPRESSION;  Surgeon: Louis Shove, MD;  Location: [facility] OR;  Service: Neurosurgery;  Laterality: Left;  Left peroneal nerve decompression   POLYPECTOMY  01/19/2024   Procedure: POLYPECTOMY;  Surgeon: Therisa Bi, MD;  Location: [facility] ENDOSCOPY;  Service: Gastroenterology;;   UPPER GASTROINTESTINAL ENDOSCOPY     Patient Active Problem List   Diagnosis Date Noted   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 07/18/2024   Pain in lower limb 07/06/2024   Entrapment of common peroneal nerve 07/06/2024   Typical atrial flutter (HCC) 04/04/2024   Adenomatous polyp of colon  01/19/2024   Heart failure with improved ejection fraction (HFimpEF) (HCC) 01/18/2024   Abnormal metabolism 11/14/2023   Weight regain after inital weight loss 11/14/2023   Multiple closed tarsal fractures of left foot, sequela 07/12/2023   Arm contusion, right, sequela 07/12/2023   Lumbar disc disease 07/12/2023   Sprain of shoulder 06/30/2023   Closed fracture of fifth metatarsal bone of left foot 06/30/2023   PAF (paroxysmal atrial fibrillation) (HCC) 06/09/2023   Paresthesia 06/03/2023   Obesity, Beginning BMI 35.45 02/17/2023   Tinnitus 01/14/2023   Change in vision 01/14/2023   Encounter for screening colonoscopy 01/14/2023   Personal history of fall 01/14/2023   Problem related to unspecified psychosocial circumstances 12/28/2022   Person encountering health services to consult on behalf of another person 12/28/2022   Palpitation 12/10/2022   Fatigue 10/26/2022   B12 deficiency 10/26/2022   Right shoulder pain 09/15/2022   Depression 09/14/2022   Other constipation 05/12/2022   Medicare annual wellness visit, initial 07/17/2021   TMJ (dislocation of temporomandibular joint) 07/17/2021   Encounter for screening mammogram for breast cancer 06/05/2021   Diarrhea 04/11/2020   Epigastric pain 02/19/2020   Dark stools 02/19/2020   History of atrial fibrillation 02/05/2019   SOB (shortness of breath) 01/27/2019   Irregular surface of cornea 12/25/2018   Acute cough 09/08/2017   History of HPV infection 12/01/2016   Screening mammogram, encounter for 11/29/2016   Estrogen deficiency 11/29/2016   Encounter for routine gynecological examination 11/29/2016   Grade III hemorrhoids 10/06/2016   Tachycardia 08/14/2016   Hemorrhoids 08/13/2016   Atypical migraine 08/03/2016   CAD (coronary artery disease)    AAA (abdominal aortic aneurysm) (HCC)    Chronic combined systolic (congestive) and diastolic (congestive) heart failure (HCC)    Prediabetes 02/11/2016   Generalized  anxiety disorder 12/08/2015   Panic attacks 07/28/2015   Chest pain 07/09/2013   Sensorineural hearing loss 03/27/2013   Vitamin D  deficiency 07/03/2012   H/O aortic dissection 05/12/2012   Routine general medical examination at a health care facility 09/20/2011   Atypical chest pain 09/15/2011   S/P AVR (aortic valve replacement) 01/07/2011   CORONARY ARTERY BYPASS GRAFT, HX OF 01/07/2011   Arthritis 12/20/2010   Back pain 12/15/2010   H/O dissecting abdominal aortic aneurysm repair 08/24/2010   IRRITABLE BOWEL SYNDROME 09/09/2009   Obstructive sleep apnea 08/04/2009   External hemorrhoid 06/26/2009   Osteoporosis 01/09/2009   Hyperlipidemia 07/26/2007   Depression with anxiety 07/26/2007   Essential hypertension 07/26/2007   Allergic rhinitis 07/26/2007   Asthma 07/26/2007   GERD 07/26/2007   Osteoarthritis 07/26/2007   Ocular migraine 07/26/2007   Mild intermittent asthma without complication 07/26/2007    PCP: Randeen Laine LABOR MD  REFERRING PROVIDER: Lamar Hamming  REFERRING DIAG: L rotator cuff   THERAPY DIAG:  Other abnormalities of gait and mobility  Other low back pain  Muscle weakness (generalized)  Other lack of coordination  Abnormal posture  Stiffness of left shoulder, not elsewhere classified  Left shoulder pain, unspecified chronicity  Chronic right shoulder pain  Acute pain of left shoulder  Cervicalgia  Rationale for Evaluation and Treatment: Rehabilitation  ONSET DATE: Feb 2024  SUBJECTIVE:                                                                                                                                                                                      SUBJECTIVE STATEMENT:    Pt reports having increased pain in the knee, noting 3/10 upon arrival.  Pt reports that performing the heel raises also increased the pain in bilateral feet, noting 5/10.    Hand dominance: Left  PERTINENT HISTORY: Shauna Bodkins is a 66yoF  familiar to our services. Pt referred to PT for strengthening of chronic left shoulder DJD, known remote RC injury. Recently fell and broke four metatarsals, since cleared from use of CAM rocker. She was treated successfully in past for right shoulder pain and has past medical history of Right shoulder surgery replacement. 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 PMH aorta type A dissection in sept 2011 s/p aortic valve replacement, bypass of R coronary artery, anemia, anxiety, CAD, CHF, depression, GERD, Heart failure, HLD, HTN, IBS, migraine, osteoarthritis, osteoporosis, RA, back pain.    Back pain:  Patient has new referral for radiculopathy, lumbar region. MRI showed degeneration of L1-2 and L2-3 with some disc space collapse and minor retrolisthesis of L2-3. Issue began in February, felt numbness in L thigh. No cause known, but she was at a museum and noticed it was numb. Sometimes L foot numbness and toe numbness of 2nd and 3rd toe causing tripping. This caused her to trip July 4th and break five metatarsals. Some minor pain in the low pain noted. Pain noted to be 2-3/10 Pain in hip 3-4/10 of L hip.    PAIN:  Are you having pain? Yes, 3/10 pain in lateral deltoids in the shoulder.  PRECAUTIONS: None  FALLS:  Has patient fallen in last 6 months? Yes. Number of falls 2  PATIENT GOALS:to lift more than 3lb with LUE, be able to eat a meal without arm being tired.    OBJECTIVE:  DIAGNOSTIC FINDINGS:  IMPRESSION: Mild degenerative change in the cervical spine without acute fracture or traumatic subluxation.   IMPRESSION: Full width, full width tears of the supraspinatus and infraspinatus tendons with retraction to the glenoid. Grade 2/3 infraspinatus  atrophy.   Moderate tendinosis of the distal subscapularis tendon with articular sided fraying of the cephalad fibers.   Moderate intra-articular long head biceps tendinosis with fraying and possible partial tearing.   Mild  glenohumeral and moderate AC joint osteoarthritis.   Degenerative superior labral tearing anteriorly and posteriorly.   PATIENT SURVEYS:  Quick Dash 50%  and FOTO 46%   COGNITION: Overall cognitive status: Within functional limits for tasks assessed                                  SENSATION: WFL   POSTURE: Slight rounded shoulders and shoulder elevation    UPPER EXTREMITY ROM:    Active ROM Right eval 9/16: recert RUE Left eval 9/16 RECERT LUE  Shoulder flexion 64 122 91 92  Shoulder extension        Shoulder abduction 95 88 52 82  Shoulder adduction        Shoulder internal rotation Unable to test at 90 61 degrees 5 40  Shoulder external rotation 18at side of body -5 degrees at 90 degrees 34 35  Elbow flexion [facility]  WFL   Elbow extension [facility]  WFL   Wrist flexion        Wrist extension        Wrist ulnar deviation        Wrist radial deviation        Wrist pronation        Wrist supination        (Blank rows = not tested)   UPPER EXTREMITY MMT:   MMT Right eval Left eval  Shoulder flexion      Shoulder extension      Shoulder abduction      Shoulder adduction      Shoulder internal rotation      Shoulder external rotation      Middle trapezius      Lower trapezius      Elbow flexion      Elbow extension      Wrist flexion      Wrist extension      Wrist ulnar deviation      Wrist radial deviation      Wrist pronation      Wrist supination      Grip strength (lbs) 30 35  (Blank rows = not tested) Unable to test strength due to pain with motion   TRUNK ROM: Trunk Flexion 65  Trunk Extension 7  Trunk R SB Limited 25%  Trunk L SB WFL  Trunk R rotation WFL  Trunk L rotation Limited 25%    Ankle ROM Seated: Motion: AROM seated Right Left  Plantarflexion 4+ 3  Dorsiflexion 4+ 4  Inversion 4 3+  Eversion 4+ 3+    SHOULDER SPECIAL TESTS: Impingement tests:  unable to test due to pain in starting position SLAP lesions:  unable to test due to pain  in starting position Instability tests:  unable to test due to pain in starting position Rotator cuff assessment:  unable to test due to pain in starting position Biceps assessment:  unable to test due to pain in starting position   LOW BACK SPECIAL TEST: SLR: negative  Scour test: negative FABER: negative Pelvic compression/distraction: negative  Slump test positive LLE  6 min walk test: 1160 ft increased left foot drag and antalgic gait pattern with foot eversion    JOINT MOBILITY TESTING:  AP: painful PA: painful Inferior: painful Distraction: reduces pain   Back mobilization hypomobile and painful to grade II mobilizations    PALPATION:  Tenderness to all joint regions of GH     TODAY'S TREATMENT: DATE: 08/09/24    Vitals assessed due to pt reporting ringing in her ear: BP: 114/75 HR: 74 SpO2: 97%  Standing : BP: 114/75 HR: 74 SpO2: 97%   TherAct: To improve functional movements patterns for everyday tasks  Staggered stance STS, 2x5 each LE   Squats with  UE support on support beam, x5 however pt noted too much pain in the R knee so this was discontinued Wall squats with physioball placed behind back, 2x10 with pt reporting no pain with this change in technique     NMR: To facilitate reeducation of movement, balance, posture, coordination, and/or proprioception/kinesthetic sense.    BAPs board performed on each ankle for increased neuro input necessary for balance/strengthening of the ankle df/pf x10 each LE inversion/eversion 20x clockwise 20x; counterclockwise 20x  static hold 60 seconds    Airex pad: Static stance eyes closed 60 seconds Weight shifts to each LE to promote weight bearing on the L LE, 60 sec bouts x2 March in place 60 sec bouts x2 Staggered stance, 60 sec bouts, x2 each LE leading      PATIENT EDUCATION: Education details: goals, POC  Person educated: Patient Education method: Explanation, Demonstration, Tactile cues, Verbal  cues, and Handouts Education comprehension: verbalized understanding, returned demonstration, verbal cues required, and tactile cues required  HOME EXERCISE PROGRAM: Access Code: [email or web address] URL: [email or web address] Date: 05/21/2024 Prepared by: Marina  Moser  Exercises - Hooklying Transversus Abdominis Palpation  - 1 x daily - 7 x weekly - 2 sets - 10 reps - 5 hold - Supine March  - 1 x daily - 7 x weekly - 2 sets - 10 reps - 5 hold - Seated Ankle Alphabet  - 1 x daily - 7 x weekly - 2 sets - 1 reps - 5 hold - Standing 'L' Stretch at Counter  - 1 x daily - 7 x weekly - 2 sets - 2 reps - 30 hold  Access Code: [email or web address] URL: [email or web address] Date: 10/03/2023 Prepared by: Marina  Moser  Exercises - Seated Scapular Retraction  - 1 x daily - 7 x weekly - 2 sets - 10 reps - 5 hold - Neck Sidebending  - 1 x daily - 7 x weekly - 2 sets - 2 reps - 30 hold - Supine Isometric Shoulder Extension with Towel  - 1 x daily - 7 x weekly - 2 sets - 10 reps - 5 hold - Supine Isometric Shoulder Adduction with Towel Roll  - 1 x daily - 7 x weekly - 2 sets - 10 reps - 5 hold - Seated Bilateral Shoulder Flexion Towel Slide at Table Top  - 1 x daily - 7 x weekly - 1 sets - 3 reps - 30 hold - Supine Posterior Pelvic Tilt  - 1 x daily - 7 x weekly - 2 sets - 5 reps - 30 hold - Supine LE Neural Mobilization  - 1 x daily - 7 x weekly - 2 sets - 10 reps - 5 hold - Seated Ankle Alphabet  - 1 x daily - 7 x weekly - 2 sets - 1 reps - 5 hold  ASSESSMENT:  CLINICAL IMPRESSION:    Pt responded well to the exercises and put forth good effort throughout the session.  Pt was limited during  some of the exercises due to the pain in the knees, however therapist was able to alter the exercise in a way that the patient could perform without an increase in pain as noted above.  Pt ultimately performed well and will continue to benefit from LE strengthening exercises in order to improve overall  tolerance and improved stability.  Pt also to benefit from continued balance training in order to improve overall proprioception of the LE's.   Pt will continue to benefit from skilled therapy to address remaining deficits in order to improve overall QoL and return to PLOF.       OBJECTIVE IMPAIRMENTS: cardiopulmonary status limiting activity, decreased activity tolerance, decreased mobility, increased fascial restrictions, impaired perceived functional ability, increased muscle spasms, impaired flexibility, impaired UE functional use, improper body mechanics, postural dysfunction, and pain.   ACTIVITY LIMITATIONS: carrying, lifting, bed mobility, bathing, dressing, self feeding, reach over head, and caring for others  PARTICIPATION LIMITATIONS: meal prep, cleaning, laundry, personal finances, interpersonal relationship, driving, shopping, community activity, yard work, church, and caregiving for husband  PERSONAL FACTORS: Age, Fitness, Past/current experiences, Time since onset of injury/illness/exacerbation, and 3+ comorbidities: aorta type A dissection in sept 2011 with aortic valve replacement bypass of R coronary artery, AAA, anemia, anxiety, CAD, CHF, depression, GERD, Heart failure, HLD, HTN, IBS, Kidney problems, migraine, osteoarthritis, osteoporosis, RA, ocular migraines, back pain are also affecting patient's functional outcome.   REHAB POTENTIAL: Fair    CLINICAL DECISION MAKING: Evolving/moderate complexity  EVALUATION COMPLEXITY: Moderate   GOALS: Goals reviewed with patient? Yes  SHORT TERM GOALS: Target date: 04/19/2024  Patient will be independent in home exercise program to improve strength/mobility for better functional independence with ADLs. Baseline:7/23: HEP given 9/16: compliant 1x/week 4/3: improving compliance 5/29: compliant Goal status: MET   LONG TERM GOALS: Target date: 09/06/24  Patient will increase FOTO score to equal to or greater than  57%   to  demonstrate statistically significant improvement in mobility and quality of life.  Baseline: 7/22: 46 9/16: 50% 10/7: 40% 12/10: 57% Goal status: MET  2.  Patient will improve shoulder AROM to > 140 degrees of flexion, scaption, and abduction for improved ability to perform overhead activities. Baseline: 7/22: see above 9/16: see above 10/7 : defer to exacerbation 12/10: shoulder abduction >140, flexion < 140 , assess scaption next visit 2/6: flexion >140, abduction >140 scaption >140 Goal status: MET  3.  Patient will demonstrate adequate shoulder ROM and strength to be able to shave and dress independently with pain less than 3/10. Baseline: 7/23: 6/10 pain with dressing, limited ROM, unable to test strength 9/16: 3/10 pain with dressing  Goal status: MET  4.  Patient will decrease Quick DASH score by > 8 points demonstrating reduced self-reported upper extremity disability for L and R shoulder  Baseline: 7/23: L shoulder 50% 9/16: R=54.54 % L 66% L 81% R 24% 12/10: R: 43.2% L: 56.8% 2/6: L 22%  R 34%  Goal status: MET  5.  Patient will be able to eat an entire meal without resting arm demonstrating improved functional strength Baseline: 7/23: has to rest while eating due to pain. 9/16: able to make halfway through meal 10/7: can make it about halfway through meal, some meals can get 3/4s through meal 12/10: improved, still has to rest every once in a while 2/6: can get halfway to 2/3rds of the meal. 5/29:  2/3rds of meal  Goal status: deferred at this time to focus on back/leg  6.  Patient will be able to stand 15 minutes without her LLE going numb allowing for ADLS such as showering and iADLs such as cooking Baseline: 5 minutes 12/10: will assess next visit 2/6: 10 minutes 4/3: 10 minutes 5/29: 5-8 minutes  7/24: 5 minutes- pt states that it is now in both of her legs 08/06/24: Pt reports she has not noticed any numbness in the feet Goal status: MET  7. Patient will increase six minute  walk test distance to >1400 for progression to community ambulator and improve gait ability Baseline: 1160 ft  12/10: 1010 ft 2/6: 1140 ft  4/3: 1201 ft 5/29: 774 ft stopped with 1 min 35 seconds remaining due to shortness of breath 6/26: 983ft with one seated rest break at 1:40 seconds remaining due to SOB 7/24: 1110 ft- pt able to walk the entire 6 minutes without a break 08/06/24: 1202' with 2 standing rest breaks Goal status: UPDATED & IN PROGRESS  8  Patient will reduce modified Oswestry score to <20 as to demonstrate minimal disability with ADLs including improved sleeping tolerance, walking/sitting tolerance etc for better mobility with ADLs.  Baseline: 2/6: 60%  4/3: 50%  7/24: 22/50 = 44% 08/06/24: 25/50: 50% Goal status: IN PROGRESS  9  Patient will improve df strength by 2lb for improved functional ambulation with decreased foot drag  Baseline: 5/29: L 4.2 R 6.9 7/24: L 4-/5 R 4+/5 08/06/24: R: 11.93 peak; 7.9 average, L: 12.6 peak; 9.6 average Goal status: MET   PLAN:  PT FREQUENCY: 2x/week  PT DURATION: 8 weeks  PLANNED INTERVENTIONS: Therapeutic exercises, Therapeutic activity, Neuromuscular re-education, Balance training, Gait training, Patient/Family education, Self Care, Joint mobilization, Vestibular training, Canalith repositioning, Visual/preceptual remediation/compensation, Dry Needling, Electrical stimulation, Spinal mobilization, Cryotherapy, Moist heat, Compression bandaging, scar mobilization, Splintting, Taping, Traction, Ultrasound, Biofeedback, Ionotophoresis 4mg /ml Dexamethasone , Manual therapy, and Re-evaluation  PLAN FOR NEXT SESSION:    Continue with B UE strengthening  L ankle strengthening, distraction, core activation, reassess scaption and flexion ROM, make HEP     Fonda Simpers, PT, DPT Physical Therapist - [facility]  [facility]  08/09/24, 10:22 PM

## 2024-08-13 ENCOUNTER — Other Ambulatory Visit: Payer: Self-pay | Admitting: Cardiovascular Disease

## 2024-08-13 ENCOUNTER — Ambulatory Visit

## 2024-08-13 DIAGNOSIS — G8929 Other chronic pain: Secondary | ICD-10-CM | POA: Diagnosis not present

## 2024-08-13 DIAGNOSIS — M5459 Other low back pain: Secondary | ICD-10-CM | POA: Diagnosis not present

## 2024-08-13 DIAGNOSIS — R293 Abnormal posture: Secondary | ICD-10-CM | POA: Diagnosis not present

## 2024-08-13 DIAGNOSIS — M6281 Muscle weakness (generalized): Secondary | ICD-10-CM | POA: Diagnosis not present

## 2024-08-13 DIAGNOSIS — M25512 Pain in left shoulder: Secondary | ICD-10-CM

## 2024-08-13 DIAGNOSIS — M25612 Stiffness of left shoulder, not elsewhere classified: Secondary | ICD-10-CM

## 2024-08-13 DIAGNOSIS — R2689 Other abnormalities of gait and mobility: Secondary | ICD-10-CM

## 2024-08-13 DIAGNOSIS — M542 Cervicalgia: Secondary | ICD-10-CM

## 2024-08-13 DIAGNOSIS — R278 Other lack of coordination: Secondary | ICD-10-CM | POA: Diagnosis not present

## 2024-08-13 DIAGNOSIS — M25511 Pain in right shoulder: Secondary | ICD-10-CM | POA: Diagnosis not present

## 2024-08-13 NOTE — Therapy (Signed)
 OUTPATIENT PHYSICAL THERAPY TREATMENT      Patient Name: Rachel Torres MRN: [id number] DOB:[birth year], 68 y.o., female Today's Date: 08/13/2024  END OF SESSION:  PT End of Session - 08/13/24 1100     Visit Number 49    Number of Visits 64    Date for PT Re-Evaluation 09/06/24    Authorization Type [employer]  Medicare HMO    Authorization Time Period 01/26/24-03/22/24    PT Start Time 1101    PT Stop Time 1145    PT Time Calculation (min) 44 min    Equipment Utilized During Treatment Gait belt    Activity Tolerance Patient tolerated treatment well;No increased pain    Behavior During Therapy WFL for tasks assessed/performed            Past Medical History:  Diagnosis Date   AAA (abdominal aortic aneurysm) (HCC)    a. Chronic w/o evidence of aneurysmal dil on CTA 01/2016.   Alcohol abuse    Allergy    Anemia    Anxiety    Asthma    Atrial fibrillation (HCC)    CAD (coronary artery disease)    a. 08/2010 s/p CABG x 1 (VG->RCA) @ time of Ao dissection repair; b. 01/2016 Lexiscan  MV: mid antsept/apical defect w/ ? peri-infarct ischemia-->likely attenuation-->Med Rx.   Chewing difficulty    Chronic combined systolic (congestive) and diastolic (congestive) heart failure (HCC)    a. 2012 EF 30-35%; b. 04/2015 EF 40-45%; c. 01/2016 Echo: Ef 55-65%, nl AoV bioprosthesis, nl RV, nl PASP.   Constipation    Depression    Edema of both lower extremities    Gallbladder sludge    GERD (gastroesophageal reflux disease)    Heart failure (HCC)    Heart valve problem    History of Bicuspid Aortic Valve    a. 08/2010 s/p AVR @ time of Ao dissection repair; b. 01/2016 Echo: Ef 55-65%, nl AoV bioprosthesis, nl RV, nl PASP.   Hx of repair of dissecting thoracic aortic aneurysm, Stanford type A    a. 08/2010 s/p repair with AVR and VG->RCA; b. 01/2016 CTA: stable appearance of Asc Thoracic Aortic graft. Opacification of flase lumen of chronic abd Ao dissection w/ retrograde flow through lumbar  arteries. No aneurysmal dil of flase lumen.   Hyperlipidemia    Hypertension    Hypertensive heart disease    IBS (irritable bowel syndrome)    Internal hemorrhoids    Kidney problem    Marfan syndrome    pt denies   Migraine    Obstructive sleep apnea    Osteoarthritis    Osteoporosis    Palpitations    Pneumonia    RA (rheumatoid arthritis) (HCC)    a. Followed by Dr. Balinda   Past Surgical History:  Procedure Laterality Date   ABDOMINAL AORTIC ANEURYSM REPAIR     CARDIAC CATHETERIZATION  2001   CARDIOVERSION N/A 04/04/2024   Procedure: CARDIOVERSION;  Surgeon: Perla Evalene PARAS, MD;  Location: [facility] ORS;  Service: Cardiovascular;  Laterality: N/A;   CESAREAN SECTION  1986 & 1991   CHOLECYSTECTOMY     COLONOSCOPY     COLONOSCOPY WITH PROPOFOL  N/A 01/19/2024   Procedure: COLONOSCOPY WITH PROPOFOL ;  Surgeon: Therisa Bi, MD;  Location[facility] ENDOSCOPY;  Service: Gastroenterology;  Laterality: N/A;   ESOPHAGOGASTRODUODENOSCOPY (EGD) WITH PROPOFOL  N/A 05/08/2020   Procedure: ESOPHAGOGASTRODUODENOSCOPY (EGD) WITH PROPOFOL ;  Surgeon: Janalyn Keene NOVAK, MD;  Location: [facility] ENDOSCOPY;  Service: Endoscopy;  Laterality: N/A;  FRACTURE SURGERY Right    shoulder replacement   HEMORRHOID BANDING     ORIF DISTAL RADIUS FRACTURE Left    PERONEAL NERVE DECOMPRESSION Left 07/18/2024   Procedure: PERONEAL NERVE DECOMPRESSION;  Surgeon: Louis Shove, MD;  Location: [facility] OR;  Service: Neurosurgery;  Laterality: Left;  Left peroneal nerve decompression   POLYPECTOMY  01/19/2024   Procedure: POLYPECTOMY;  Surgeon: Therisa Bi, MD;  Location: [facility] ENDOSCOPY;  Service: Gastroenterology;;   UPPER GASTROINTESTINAL ENDOSCOPY     Patient Active Problem List   Diagnosis Date Noted   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 07/18/2024   Pain in lower limb 07/06/2024   Entrapment of common peroneal nerve 07/06/2024   Typical atrial flutter (HCC) 04/04/2024   Adenomatous polyp of colon  01/19/2024   Heart failure with improved ejection fraction (HFimpEF) (HCC) 01/18/2024   Abnormal metabolism 11/14/2023   Weight regain after inital weight loss 11/14/2023   Multiple closed tarsal fractures of left foot, sequela 07/12/2023   Arm contusion, right, sequela 07/12/2023   Lumbar disc disease 07/12/2023   Sprain of shoulder 06/30/2023   Closed fracture of fifth metatarsal bone of left foot 06/30/2023   PAF (paroxysmal atrial fibrillation) (HCC) 06/09/2023   Paresthesia 06/03/2023   Obesity, Beginning BMI 35.45 02/17/2023   Tinnitus 01/14/2023   Change in vision 01/14/2023   Encounter for screening colonoscopy 01/14/2023   Personal history of fall 01/14/2023   Problem related to unspecified psychosocial circumstances 12/28/2022   Person encountering health services to consult on behalf of another person 12/28/2022   Palpitation 12/10/2022   Fatigue 10/26/2022   B12 deficiency 10/26/2022   Right shoulder pain 09/15/2022   Depression 09/14/2022   Other constipation 05/12/2022   Medicare annual wellness visit, initial 07/17/2021   TMJ (dislocation of temporomandibular joint) 07/17/2021   Encounter for screening mammogram for breast cancer 06/05/2021   Diarrhea 04/11/2020   Epigastric pain 02/19/2020   Dark stools 02/19/2020   History of atrial fibrillation 02/05/2019   SOB (shortness of breath) 01/27/2019   Irregular surface of cornea 12/25/2018   Acute cough 09/08/2017   History of HPV infection 12/01/2016   Screening mammogram, encounter for 11/29/2016   Estrogen deficiency 11/29/2016   Encounter for routine gynecological examination 11/29/2016   Grade III hemorrhoids 10/06/2016   Tachycardia 08/14/2016   Hemorrhoids 08/13/2016   Atypical migraine 08/03/2016   CAD (coronary artery disease)    AAA (abdominal aortic aneurysm) (HCC)    Chronic combined systolic (congestive) and diastolic (congestive) heart failure (HCC)    Prediabetes 02/11/2016   Generalized  anxiety disorder 12/08/2015   Panic attacks 07/28/2015   Chest pain 07/09/2013   Sensorineural hearing loss 03/27/2013   Vitamin D  deficiency 07/03/2012   H/O aortic dissection 05/12/2012   Routine general medical examination at a health care facility 09/20/2011   Atypical chest pain 09/15/2011   S/P AVR (aortic valve replacement) 01/07/2011   CORONARY ARTERY BYPASS GRAFT, HX OF 01/07/2011   Arthritis 12/20/2010   Back pain 12/15/2010   H/O dissecting abdominal aortic aneurysm repair 08/24/2010   IRRITABLE BOWEL SYNDROME 09/09/2009   Obstructive sleep apnea 08/04/2009   External hemorrhoid 06/26/2009   Osteoporosis 01/09/2009   Hyperlipidemia 07/26/2007   Depression with anxiety 07/26/2007   Essential hypertension 07/26/2007   Allergic rhinitis 07/26/2007   Asthma 07/26/2007   GERD 07/26/2007   Osteoarthritis 07/26/2007   Ocular migraine 07/26/2007   Mild intermittent asthma without complication 07/26/2007    PCP: Randeen, Laine A  MD   REFERRING PROVIDER: Lamar Hamming  REFERRING DIAG: L rotator cuff   THERAPY DIAG:  Other abnormalities of gait and mobility  Other low back pain  Muscle weakness (generalized)  Other lack of coordination  Abnormal posture  Stiffness of left shoulder, not elsewhere classified  Left shoulder pain, unspecified chronicity  Chronic right shoulder pain  Acute pain of left shoulder  Cervicalgia  Rationale for Evaluation and Treatment: Rehabilitation  ONSET DATE: Feb 2024  SUBJECTIVE:                                                                                                                                                                                      SUBJECTIVE STATEMENT:    Pt reports 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  Pt notes her knee and shoulder are hurting more from the pushing, but otherwise the foot is doing well.  Hand dominance:  Left  PERTINENT HISTORY: Ingrid Shifrin is a 66yoF familiar to our services. Pt referred to PT for strengthening of chronic left shoulder DJD, known remote RC injury. Recently fell and broke four metatarsals, since cleared from use of CAM rocker. She was treated successfully in past for right shoulder pain and has past medical history of Right shoulder surgery replacement. 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 PMH aorta type A dissection in sept 2011 s/p aortic valve replacement, bypass of R coronary artery, anemia, anxiety, CAD, CHF, depression, GERD, Heart failure, HLD, HTN, IBS, migraine, osteoarthritis, osteoporosis, RA, back pain.    Back pain:  Patient has new referral for radiculopathy, lumbar region. MRI showed degeneration of L1-2 and L2-3 with some disc space collapse and minor retrolisthesis of L2-3. Issue began in February, felt numbness in L thigh. No cause known, but she was at a museum and noticed it was numb. Sometimes L foot numbness and toe numbness of 2nd and 3rd toe causing tripping. This caused her to trip July 4th and break five metatarsals. Some minor pain in the low pain noted. Pain noted to be 2-3/10 Pain in hip 3-4/10 of L hip.    PAIN:  Are you having pain? Yes, 3/10 pain in lateral deltoids in the shoulder.  PRECAUTIONS: None  FALLS:  Has patient fallen in last 6 months? Yes. Number of falls 2  PATIENT GOALS:to lift more than 3lb with LUE, be able to eat a meal without arm being tired.    OBJECTIVE:  DIAGNOSTIC FINDINGS:  IMPRESSION: Mild degenerative change in the cervical spine without acute fracture or traumatic subluxation.   IMPRESSION: Full width, full width tears of  the supraspinatus and infraspinatus tendons with retraction to the glenoid. Grade 2/3 infraspinatus atrophy.   Moderate tendinosis of the distal subscapularis tendon with articular sided fraying of the cephalad fibers.   Moderate intra-articular long head biceps tendinosis with  fraying and possible partial tearing.   Mild glenohumeral and moderate AC joint osteoarthritis.   Degenerative superior labral tearing anteriorly and posteriorly.   PATIENT SURVEYS:  Quick Dash 50%  and FOTO 46%   COGNITION: Overall cognitive status: Within functional limits for tasks assessed                                  SENSATION: WFL   POSTURE: Slight rounded shoulders and shoulder elevation    UPPER EXTREMITY ROM:    Active ROM Right eval 9/16: recert RUE Left eval 9/16 RECERT LUE  Shoulder flexion 64 122 91 92  Shoulder extension        Shoulder abduction 95 88 52 82  Shoulder adduction        Shoulder internal rotation Unable to test at 90 61 degrees 5 40  Shoulder external rotation 18at side of body -5 degrees at 90 degrees 34 35  Elbow flexion [facility]  WFL   Elbow extension [facility]  WFL   Wrist flexion        Wrist extension        Wrist ulnar deviation        Wrist radial deviation        Wrist pronation        Wrist supination        (Blank rows = not tested)   UPPER EXTREMITY MMT:   MMT Right eval Left eval  Shoulder flexion      Shoulder extension      Shoulder abduction      Shoulder adduction      Shoulder internal rotation      Shoulder external rotation      Middle trapezius      Lower trapezius      Elbow flexion      Elbow extension      Wrist flexion      Wrist extension      Wrist ulnar deviation      Wrist radial deviation      Wrist pronation      Wrist supination      Grip strength (lbs) 30 35  (Blank rows = not tested) Unable to test strength due to pain with motion   TRUNK ROM: Trunk Flexion 65  Trunk Extension 7  Trunk R SB Limited 25%  Trunk L SB WFL  Trunk R rotation WFL  Trunk L rotation Limited 25%    Ankle ROM Seated: Motion: AROM seated Right Left  Plantarflexion 4+ 3  Dorsiflexion 4+ 4  Inversion 4 3+  Eversion 4+ 3+    SHOULDER SPECIAL TESTS: Impingement tests:  unable to test due to pain in starting  position SLAP lesions:  unable to test due to pain in starting position Instability tests:  unable to test due to pain in starting position Rotator cuff assessment:  unable to test due to pain in starting position Biceps assessment:  unable to test due to pain in starting position   LOW BACK SPECIAL TEST: SLR: negative  Scour test: negative FABER: negative Pelvic compression/distraction: negative  Slump test positive LLE  6 min walk test: 1160 ft increased left foot drag and  antalgic gait pattern with foot eversion    JOINT MOBILITY TESTING:  AP: painful PA: painful Inferior: painful Distraction: reduces pain   Back mobilization hypomobile and painful to grade II mobilizations    PALPATION:  Tenderness to all joint regions of GH     TODAY'S TREATMENT: DATE: 08/13/24     Manual:  Supine STM with TP release applied to the biceps, UT region for pain modulation and improved tissue extensibility  Supine long axis distraction, 30 sec bouts x4  Supine AP/Inferior glides of the R shoulder, Grades II-III, for pain modulation and improving ROM    TherEx:  Supine SAQ with 5# AW donned, 2x10 with verbal cues for slowed eccentric lowering  Supine SLR with 5# AW donned, 2x10 each LE  Supine LTR, 2x10 to alleviate back spasms  Supine bridging with glute squeeze prior to lift off, 2x10  Wall squats with physioball placed behind back, 2x10        PATIENT EDUCATION: Education details: goals, POC  Person educated: Patient Education method: Explanation, Demonstration, Tactile cues, Verbal cues, and Handouts Education comprehension: verbalized understanding, returned demonstration, verbal cues required, and tactile cues required  HOME EXERCISE PROGRAM: Access Code: [email or web address] URL: [email or web address] Date: 05/21/2024 Prepared by: Marina  Moser  Exercises - Hooklying Transversus Abdominis Palpation  - 1 x daily - 7 x weekly - 2 sets - 10 reps - 5 hold -  Supine March  - 1 x daily - 7 x weekly - 2 sets - 10 reps - 5 hold - Seated Ankle Alphabet  - 1 x daily - 7 x weekly - 2 sets - 1 reps - 5 hold - Standing 'L' Stretch at Counter  - 1 x daily - 7 x weekly - 2 sets - 2 reps - 30 hold  Access Code: [email or web address] URL: [email or web address] Date: 10/03/2023 Prepared by: Marina  Moser  Exercises - Seated Scapular Retraction  - 1 x daily - 7 x weekly - 2 sets - 10 reps - 5 hold - Neck Sidebending  - 1 x daily - 7 x weekly - 2 sets - 2 reps - 30 hold - Supine Isometric Shoulder Extension with Towel  - 1 x daily - 7 x weekly - 2 sets - 10 reps - 5 hold - Supine Isometric Shoulder Adduction with Towel Roll  - 1 x daily - 7 x weekly - 2 sets - 10 reps - 5 hold - Seated Bilateral Shoulder Flexion Towel Slide at Table Top  - 1 x daily - 7 x weekly - 1 sets - 3 reps - 30 hold - Supine Posterior Pelvic Tilt  - 1 x daily - 7 x weekly - 2 sets - 5 reps - 30 hold - Supine LE Neural Mobilization  - 1 x daily - 7 x weekly - 2 sets - 10 reps - 5 hold - Seated Ankle Alphabet  - 1 x daily - 7 x weekly - 2 sets - 1 reps - 5 hold  ASSESSMENT:  CLINICAL IMPRESSION:    Pt responded well to the exercises and put forth great effort throughout.  Pt notes she has made signifciant progress with the L ankle and due to the complication being in the knees and the R shoulder, those areas were addressed more than the ankle today.  Pt and therapist did discuss potential discharge at the next visit following the progress note.   Pt will continue to benefit from skilled therapy to address remaining deficits in order to improve overall  QoL and return to PLOF.        OBJECTIVE IMPAIRMENTS: cardiopulmonary status limiting activity, decreased activity tolerance, decreased mobility, increased fascial restrictions, impaired perceived functional ability, increased muscle spasms, impaired flexibility, impaired UE functional use, improper body mechanics, postural dysfunction,  and pain.   ACTIVITY LIMITATIONS: carrying, lifting, bed mobility, bathing, dressing, self feeding, reach over head, and caring for others  PARTICIPATION LIMITATIONS: meal prep, cleaning, laundry, personal finances, interpersonal relationship, driving, shopping, community activity, yard work, church, and caregiving for husband  PERSONAL FACTORS: Age, Fitness, Past/current experiences, Time since onset of injury/illness/exacerbation, and 3+ comorbidities: aorta type A dissection in sept 2011 with aortic valve replacement bypass of R coronary artery, AAA, anemia, anxiety, CAD, CHF, depression, GERD, Heart failure, HLD, HTN, IBS, Kidney problems, migraine, osteoarthritis, osteoporosis, RA, ocular migraines, back pain are also affecting patient's functional outcome.   REHAB POTENTIAL: Fair    CLINICAL DECISION MAKING: Evolving/moderate complexity  EVALUATION COMPLEXITY: Moderate   GOALS: Goals reviewed with patient? Yes  SHORT TERM GOALS: Target date: 04/19/2024  Patient will be independent in home exercise program to improve strength/mobility for better functional independence with ADLs. Baseline:7/23: HEP given 9/16: compliant 1x/week 4/3: improving compliance 5/29: compliant Goal status: MET   LONG TERM GOALS: Target date: 09/06/24  Patient will increase FOTO score to equal to or greater than  57%   to demonstrate statistically significant improvement in mobility and quality of life.  Baseline: 7/22: 46 9/16: 50% 10/7: 40% 12/10: 57% Goal status: MET  2.  Patient will improve shoulder AROM to > 140 degrees of flexion, scaption, and abduction for improved ability to perform overhead activities. Baseline: 7/22: see above 9/16: see above 10/7 : defer to exacerbation 12/10: shoulder abduction >140, flexion < 140 , assess scaption next visit 2/6: flexion >140, abduction >140 scaption >140 Goal status: MET  3.  Patient will demonstrate adequate shoulder ROM and strength to be able to shave  and dress independently with pain less than 3/10. Baseline: 7/23: 6/10 pain with dressing, limited ROM, unable to test strength 9/16: 3/10 pain with dressing  Goal status: MET  4.  Patient will decrease Quick DASH score by > 8 points demonstrating reduced self-reported upper extremity disability for L and R shoulder  Baseline: 7/23: L shoulder 50% 9/16: R=54.54 % L 66% L 81% R 24% 12/10: R: 43.2% L: 56.8% 2/6: L 22%  R 34%  Goal status: MET  5.  Patient will be able to eat an entire meal without resting arm demonstrating improved functional strength Baseline: 7/23: has to rest while eating due to pain. 9/16: able to make halfway through meal 10/7: can make it about halfway through meal, some meals can get 3/4s through meal 12/10: improved, still has to rest every once in a while 2/6: can get halfway to 2/3rds of the meal. 5/29:  2/3rds of meal  Goal status: deferred at this time to focus on back/leg  6.  Patient will be able to stand 15 minutes without her LLE going numb allowing for ADLS such as showering and iADLs such as cooking Baseline: 5 minutes 12/10: will assess next visit 2/6: 10 minutes 4/3: 10 minutes 5/29: 5-8 minutes  7/24: 5 minutes- pt states that it is now in both of her legs 08/06/24: Pt reports she has not noticed any numbness in the feet Goal status: MET  7. Patient will increase six minute walk test distance to >1400 for progression to community ambulator and improve gait ability Baseline:  1160 ft  12/10: 1010 ft 2/6: 1140 ft  4/3: 1201 ft 5/29: 774 ft stopped with 1 min 35 seconds remaining due to shortness of breath 6/26: 935ft with one seated rest break at 1:40 seconds remaining due to SOB 7/24: 1110 ft- pt able to walk the entire 6 minutes without a break 08/06/24: 1202' with 2 standing rest breaks Goal status: UPDATED & IN PROGRESS  8  Patient will reduce modified Oswestry score to <20 as to demonstrate minimal disability with ADLs including improved sleeping tolerance,  walking/sitting tolerance etc for better mobility with ADLs.  Baseline: 2/6: 60%  4/3: 50%  7/24: 22/50 = 44% 08/06/24: 25/50: 50% Goal status: IN PROGRESS  9  Patient will improve df strength by 2lb for improved functional ambulation with decreased foot drag  Baseline: 5/29: L 4.2 R 6.9 7/24: L 4-/5 R 4+/5 08/06/24: R: 11.93 peak; 7.9 average, L: 12.6 peak; 9.6 average Goal status: MET   PLAN:  PT FREQUENCY: 2x/week  PT DURATION: 8 weeks  PLANNED INTERVENTIONS: Therapeutic exercises, Therapeutic activity, Neuromuscular re-education, Balance training, Gait training, Patient/Family education, Self Care, Joint mobilization, Vestibular training, Canalith repositioning, Visual/preceptual remediation/compensation, Dry Needling, Electrical stimulation, Spinal mobilization, Cryotherapy, Moist heat, Compression bandaging, scar mobilization, Splintting, Taping, Traction, Ultrasound, Biofeedback, Ionotophoresis 4mg /ml Dexamethasone , Manual therapy, and Re-evaluation  PLAN FOR NEXT SESSION:   D/C Continue with B UE strengthening  L ankle strengthening, distraction, core activation, reassess scaption and flexion ROM, make HEP     Fonda Simpers, PT, DPT Physical Therapist - [facility]  [facility]  08/13/24, 12:11 PM

## 2024-08-14 ENCOUNTER — Ambulatory Visit (INDEPENDENT_AMBULATORY_CARE_PROVIDER_SITE_OTHER): Payer: Medicare HMO | Admitting: Psychology

## 2024-08-14 DIAGNOSIS — F331 Major depressive disorder, recurrent, moderate: Secondary | ICD-10-CM | POA: Diagnosis not present
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
## 2024-08-16 ENCOUNTER — Ambulatory Visit

## 2024-08-16 DIAGNOSIS — R2689 Other abnormalities of gait and mobility: Secondary | ICD-10-CM | POA: Diagnosis not present

## 2024-08-16 DIAGNOSIS — M25512 Pain in left shoulder: Secondary | ICD-10-CM

## 2024-08-16 DIAGNOSIS — M25511 Pain in right shoulder: Secondary | ICD-10-CM | POA: Diagnosis not present

## 2024-08-16 DIAGNOSIS — M542 Cervicalgia: Secondary | ICD-10-CM

## 2024-08-16 DIAGNOSIS — R278 Other lack of coordination: Secondary | ICD-10-CM | POA: Diagnosis not present

## 2024-08-16 DIAGNOSIS — M25612 Stiffness of left shoulder, not elsewhere classified: Secondary | ICD-10-CM

## 2024-08-16 DIAGNOSIS — M6281 Muscle weakness (generalized): Secondary | ICD-10-CM

## 2024-08-16 DIAGNOSIS — R293 Abnormal posture: Secondary | ICD-10-CM | POA: Diagnosis not present

## 2024-08-16 DIAGNOSIS — M5459 Other low back pain: Secondary | ICD-10-CM | POA: Diagnosis not present

## 2024-08-16 DIAGNOSIS — G8929 Other chronic pain: Secondary | ICD-10-CM

## 2024-08-16 NOTE — Therapy (Signed)
 OUTPATIENT PHYSICAL THERAPY TREATMENT/DISCHARGE/PHYSICAL THERAPY PROGRESS NOTE   Dates of reporting period  06/07/24   to   08/16/24   Patient Name: Rachel Torres MRN: [id number] DOB:02-16-1956, 68 y.o., female Today's Date: 08/16/2024  END OF SESSION:  PT End of Session - 08/16/24 1107     Visit Number 50    Number of Visits 64    Date for PT Re-Evaluation 09/06/24    Authorization Type [employer]  Medicare HMO    Authorization Time Period 01/26/24-03/22/24    PT Start Time 1104    PT Stop Time 1122    PT Time Calculation (min) 18 min    Equipment Utilized During Treatment Gait belt    Activity Tolerance Patient tolerated treatment well;No increased pain    Behavior During Therapy WFL for tasks assessed/performed          Past Medical History:  Diagnosis Date   AAA (abdominal aortic aneurysm) (HCC)    a. Chronic w/o evidence of aneurysmal dil on CTA 01/2016.   Alcohol abuse    Allergy    Anemia    Anxiety    Asthma    Atrial fibrillation (HCC)    CAD (coronary artery disease)    a. 08/2010 s/p CABG x 1 (VG->RCA) @ time of Ao dissection repair; b. 01/2016 Lexiscan  MV: mid antsept/apical defect w/ ? peri-infarct ischemia-->likely attenuation-->Med Rx.   Chewing difficulty    Chronic combined systolic (congestive) and diastolic (congestive) heart failure (HCC)    a. 2012 EF 30-35%; b. 04/2015 EF 40-45%; c. 01/2016 Echo: Ef 55-65%, nl AoV bioprosthesis, nl RV, nl PASP.   Constipation    Depression    Edema of both lower extremities    Gallbladder sludge    GERD (gastroesophageal reflux disease)    Heart failure (HCC)    Heart valve problem    History of Bicuspid Aortic Valve    a. 08/2010 s/p AVR @ time of Ao dissection repair; b. 01/2016 Echo: Ef 55-65%, nl AoV bioprosthesis, nl RV, nl PASP.   Hx of repair of dissecting thoracic aortic aneurysm, Stanford type A    a. 08/2010 s/p repair with AVR and VG->RCA; b. 01/2016 CTA: stable appearance of Asc Thoracic Aortic graft.  Opacification of flase lumen of chronic abd Ao dissection w/ retrograde flow through lumbar arteries. No aneurysmal dil of flase lumen.   Hyperlipidemia    Hypertension    Hypertensive heart disease    IBS (irritable bowel syndrome)    Internal hemorrhoids    Kidney problem    Marfan syndrome    pt denies   Migraine    Obstructive sleep apnea    Osteoarthritis    Osteoporosis    Palpitations    Pneumonia    RA (rheumatoid arthritis) (HCC)    a. Followed by Dr. Balinda   Past Surgical History:  Procedure Laterality Date   ABDOMINAL AORTIC ANEURYSM REPAIR     CARDIAC CATHETERIZATION  2001   CARDIOVERSION N/A 04/04/2024   Procedure: CARDIOVERSION;  Surgeon: Perla Evalene PARAS, MD;  Location: [facility] ORS;  Service: Cardiovascular;  Laterality: N/A;   CESAREAN SECTION  1986 & 1991   CHOLECYSTECTOMY     COLONOSCOPY     COLONOSCOPY WITH PROPOFOL  N/A 01/19/2024   Procedure: COLONOSCOPY WITH PROPOFOL ;  Surgeon: Therisa Bi, MD;  Location: [facility] ENDOSCOPY;  Service: Gastroenterology;  Laterality: N/A;   ESOPHAGOGASTRODUODENOSCOPY (EGD) WITH PROPOFOL  N/A 05/08/2020   Procedure: ESOPHAGOGASTRODUODENOSCOPY (EGD) WITH PROPOFOL ;  Surgeon: Janalyn Keene NOVAK, MD;  Location: [facility] ENDOSCOPY;  Service: Endoscopy;  Laterality: N/A;   FRACTURE SURGERY Right    shoulder replacement   HEMORRHOID BANDING     ORIF DISTAL RADIUS FRACTURE Left    PERONEAL NERVE DECOMPRESSION Left 07/18/2024   Procedure: PERONEAL NERVE DECOMPRESSION;  Surgeon: Louis Shove, MD;  Location: [facility] OR;  Service: Neurosurgery;  Laterality: Left;  Left peroneal nerve decompression   POLYPECTOMY  01/19/2024   Procedure: POLYPECTOMY;  Surgeon: Therisa Bi, MD;  Location: [facility] ENDOSCOPY;  Service: Gastroenterology;;   UPPER GASTROINTESTINAL ENDOSCOPY     Patient Active Problem List   Diagnosis Date Noted   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 07/18/2024   Pain in lower limb 07/06/2024   Entrapment of common peroneal nerve  07/06/2024   Typical atrial flutter (HCC) 04/04/2024   Adenomatous polyp of colon 01/19/2024   Heart failure with improved ejection fraction (HFimpEF) (HCC) 01/18/2024   Abnormal metabolism 11/14/2023   Weight regain after inital weight loss 11/14/2023   Multiple closed tarsal fractures of left foot, sequela 07/12/2023   Arm contusion, right, sequela 07/12/2023   Lumbar disc disease 07/12/2023   Sprain of shoulder 06/30/2023   Closed fracture of fifth metatarsal bone of left foot 06/30/2023   PAF (paroxysmal atrial fibrillation) (HCC) 06/09/2023   Paresthesia 06/03/2023   Obesity, Beginning BMI 35.45 02/17/2023   Tinnitus 01/14/2023   Change in vision 01/14/2023   Encounter for screening colonoscopy 01/14/2023   Personal history of fall 01/14/2023   Problem related to unspecified psychosocial circumstances 12/28/2022   Person encountering health services to consult on behalf of another person 12/28/2022   Palpitation 12/10/2022   Fatigue 10/26/2022   B12 deficiency 10/26/2022   Right shoulder pain 09/15/2022   Depression 09/14/2022   Other constipation 05/12/2022   Medicare annual wellness visit, initial 07/17/2021   TMJ (dislocation of temporomandibular joint) 07/17/2021   Encounter for screening mammogram for breast cancer 06/05/2021   Diarrhea 04/11/2020   Epigastric pain 02/19/2020   Dark stools 02/19/2020   History of atrial fibrillation 02/05/2019   SOB (shortness of breath) 01/27/2019   Irregular surface of cornea 12/25/2018   Acute cough 09/08/2017   History of HPV infection 12/01/2016   Screening mammogram, encounter for 11/29/2016   Estrogen deficiency 11/29/2016   Encounter for routine gynecological examination 11/29/2016   Grade III hemorrhoids 10/06/2016   Tachycardia 08/14/2016   Hemorrhoids 08/13/2016   Atypical migraine 08/03/2016   CAD (coronary artery disease)    AAA (abdominal aortic aneurysm) (HCC)    Chronic combined systolic (congestive) and  diastolic (congestive) heart failure (HCC)    Prediabetes 02/11/2016   Generalized anxiety disorder 12/08/2015   Panic attacks 07/28/2015   Chest pain 07/09/2013   Sensorineural hearing loss 03/27/2013   Vitamin D  deficiency 07/03/2012   H/O aortic dissection 05/12/2012   Routine general medical examination at a health care facility 09/20/2011   Atypical chest pain 09/15/2011   S/P AVR (aortic valve replacement) 01/07/2011   CORONARY ARTERY BYPASS GRAFT, HX OF 01/07/2011   Arthritis 12/20/2010   Back pain 12/15/2010   H/O dissecting abdominal aortic aneurysm repair 08/24/2010   IRRITABLE BOWEL SYNDROME 09/09/2009   Obstructive sleep apnea 08/04/2009   External hemorrhoid 06/26/2009   Osteoporosis 01/09/2009   Hyperlipidemia 07/26/2007   Depression with anxiety 07/26/2007   Essential hypertension 07/26/2007   Allergic rhinitis 07/26/2007   Asthma 07/26/2007   GERD 07/26/2007   Osteoarthritis 07/26/2007   Ocular migraine 07/26/2007   Mild intermittent  asthma without complication 07/26/2007    PCP: Randeen Laine LABOR MD   REFERRING PROVIDER: Lamar Hamming  REFERRING DIAG: L rotator cuff   THERAPY DIAG:  Other abnormalities of gait and mobility  Other low back pain  Muscle weakness (generalized)  Other lack of coordination  Abnormal posture  Stiffness of left shoulder, not elsewhere classified  Left shoulder pain, unspecified chronicity  Chronic right shoulder pain  Acute pain of left shoulder  Cervicalgia  Rationale for Evaluation and Treatment: Rehabilitation  ONSET DATE: Feb 2024  SUBJECTIVE:                                                                                                                                                                                      SUBJECTIVE STATEMENT:    Pt reports that she is ready to be discharged and is doing well.  Hand dominance: Left  PERTINENT HISTORY: Kitiara Hintze is a 66yoF familiar to our services.  Pt referred to PT for strengthening of chronic left shoulder DJD, known remote RC injury. Recently fell and broke four metatarsals, since cleared from use of CAM rocker. She was treated successfully in past for right shoulder pain and has past medical history of Right shoulder surgery replacement. 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 PMH aorta type A dissection in sept 2011 s/p aortic valve replacement, bypass of R coronary artery, anemia, anxiety, CAD, CHF, depression, GERD, Heart failure, HLD, HTN, IBS, migraine, osteoarthritis, osteoporosis, RA, back pain.    Back pain:  Patient has new referral for radiculopathy, lumbar region. MRI showed degeneration of L1-2 and L2-3 with some disc space collapse and minor retrolisthesis of L2-3. Issue began in February, felt numbness in L thigh. No cause known, but she was at a museum and noticed it was numb. Sometimes L foot numbness and toe numbness of 2nd and 3rd toe causing tripping. This caused her to trip July 4th and break five metatarsals. Some minor pain in the low pain noted. Pain noted to be 2-3/10 Pain in hip 3-4/10 of L hip.    PAIN:  Are you having pain? Yes, 3/10 pain in lateral deltoids in the shoulder.  PRECAUTIONS: None  FALLS:  Has patient fallen in last 6 months? Yes. Number of falls 2  PATIENT GOALS:to lift more than 3lb with LUE, be able to eat a meal without arm being tired.    OBJECTIVE:  DIAGNOSTIC FINDINGS:  IMPRESSION: Mild degenerative change in the cervical spine without acute fracture or traumatic subluxation.   IMPRESSION: Full width, full width tears of the supraspinatus and infraspinatus tendons with retraction to the glenoid. Grade 2/3 infraspinatus atrophy.   Moderate  tendinosis of the distal subscapularis tendon with articular sided fraying of the cephalad fibers.   Moderate intra-articular long head biceps tendinosis with fraying and possible partial tearing.   Mild glenohumeral and moderate AC  joint osteoarthritis.   Degenerative superior labral tearing anteriorly and posteriorly.   PATIENT SURVEYS:  Quick Dash 50%  and FOTO 46%   COGNITION: Overall cognitive status: Within functional limits for tasks assessed                                  SENSATION: WFL   POSTURE: Slight rounded shoulders and shoulder elevation    UPPER EXTREMITY ROM:    Active ROM Right eval 9/16: recert RUE Left eval 9/16 RECERT LUE  Shoulder flexion 64 122 91 92  Shoulder extension        Shoulder abduction 95 88 52 82  Shoulder adduction        Shoulder internal rotation Unable to test at 90 61 degrees 5 40  Shoulder external rotation 18at side of body -5 degrees at 90 degrees 34 35  Elbow flexion [facility]  WFL   Elbow extension [facility]  WFL   Wrist flexion        Wrist extension        Wrist ulnar deviation        Wrist radial deviation        Wrist pronation        Wrist supination        (Blank rows = not tested)   UPPER EXTREMITY MMT:   MMT Right eval Left eval  Shoulder flexion      Shoulder extension      Shoulder abduction      Shoulder adduction      Shoulder internal rotation      Shoulder external rotation      Middle trapezius      Lower trapezius      Elbow flexion      Elbow extension      Wrist flexion      Wrist extension      Wrist ulnar deviation      Wrist radial deviation      Wrist pronation      Wrist supination      Grip strength (lbs) 30 35  (Blank rows = not tested) Unable to test strength due to pain with motion   TRUNK ROM: Trunk Flexion 65  Trunk Extension 7  Trunk R SB Limited 25%  Trunk L SB WFL  Trunk R rotation WFL  Trunk L rotation Limited 25%    Ankle ROM Seated: Motion: AROM seated Right Left  Plantarflexion 4+ 3  Dorsiflexion 4+ 4  Inversion 4 3+  Eversion 4+ 3+    SHOULDER SPECIAL TESTS: Impingement tests:  unable to test due to pain in starting position SLAP lesions:  unable to test due to pain in starting  position Instability tests:  unable to test due to pain in starting position Rotator cuff assessment:  unable to test due to pain in starting position Biceps assessment:  unable to test due to pain in starting position   LOW BACK SPECIAL TEST: SLR: negative  Scour test: negative FABER: negative Pelvic compression/distraction: negative  Slump test positive LLE  6 min walk test: 1160 ft increased left foot drag and antalgic gait pattern with foot eversion    JOINT MOBILITY TESTING:  AP: painful PA: painful  Inferior: painful Distraction: reduces pain   Back mobilization hypomobile and painful to grade II mobilizations    PALPATION:  Tenderness to all joint regions of GH     TODAY'S TREATMENT: DATE: 08/16/24    Self-Care/Home Management:  Education provided on HEP and continuation of current exercise program in order to maintain the the improved stability in the L LE along with strength made.  Pt also encouraged to participate in the other exercises from the previous sessions that assisted her in improving her back and shoulder pain to prevent re-injury.    Activities-specific Balance Confidence Scale:  Score: 20% Increased risk of falls in community-dwelling, older adults <80% (79.89%)  0% = no confidence - 100% = complete confidence (ANPTA Core Set of Outcome Measures for Adults with Neurologic Conditions, 2018)  Pt given the education and rationale behind the ABC scale and the results regarding the test.    PATIENT EDUCATION: Education details: goals, POC  Person educated: Patient Education method: Explanation, Demonstration, Tactile cues, Verbal cues, and Handouts Education comprehension: verbalized understanding, returned demonstration, verbal cues required, and tactile cues required  HOME EXERCISE PROGRAM: Access Code: [email or web address] URL: [email or web address] Date: 05/21/2024 Prepared by: Marina  Moser  Exercises - Hooklying Transversus Abdominis  Palpation  - 1 x daily - 7 x weekly - 2 sets - 10 reps - 5 hold - Supine March  - 1 x daily - 7 x weekly - 2 sets - 10 reps - 5 hold - Seated Ankle Alphabet  - 1 x daily - 7 x weekly - 2 sets - 1 reps - 5 hold - Standing 'L' Stretch at Counter  - 1 x daily - 7 x weekly - 2 sets - 2 reps - 30 hold  Access Code: [email or web address] URL: [email or web address] Date: 10/03/2023 Prepared by: Marina  Moser  Exercises - Seated Scapular Retraction  - 1 x daily - 7 x weekly - 2 sets - 10 reps - 5 hold - Neck Sidebending  - 1 x daily - 7 x weekly - 2 sets - 2 reps - 30 hold - Supine Isometric Shoulder Extension with Towel  - 1 x daily - 7 x weekly - 2 sets - 10 reps - 5 hold - Supine Isometric Shoulder Adduction with Towel Roll  - 1 x daily - 7 x weekly - 2 sets - 10 reps - 5 hold - Seated Bilateral Shoulder Flexion Towel Slide at Table Top  - 1 x daily - 7 x weekly - 1 sets - 3 reps - 30 hold - Supine Posterior Pelvic Tilt  - 1 x daily - 7 x weekly - 2 sets - 5 reps - 30 hold - Supine LE Neural Mobilization  - 1 x daily - 7 x weekly - 2 sets - 10 reps - 5 hold - Seated Ankle Alphabet  - 1 x daily - 7 x weekly - 2 sets - 1 reps - 5 hold  ASSESSMENT:  CLINICAL IMPRESSION:    Pt has responded well to the exercises and treatment approaches in order to strengthen the L LE and reduce pain in low back and hips.  Pt ultimately has been able to meet all goals set forth at this time and is ready to discharge.  Pt encouraged to continue to perform HEP in order to maintain overall strength and balance levels that she has been able to obtain.  Pt is formally discharged at this time.        OBJECTIVE IMPAIRMENTS: cardiopulmonary status limiting activity,  decreased activity tolerance, decreased mobility, increased fascial restrictions, impaired perceived functional ability, increased muscle spasms, impaired flexibility, impaired UE functional use, improper body mechanics, postural dysfunction, and pain.    ACTIVITY LIMITATIONS: carrying, lifting, bed mobility, bathing, dressing, self feeding, reach over head, and caring for others  PARTICIPATION LIMITATIONS: meal prep, cleaning, laundry, personal finances, interpersonal relationship, driving, shopping, community activity, yard work, church, and caregiving for husband  PERSONAL FACTORS: Age, Fitness, Past/current experiences, Time since onset of injury/illness/exacerbation, and 3+ comorbidities: aorta type A dissection in sept 2011 with aortic valve replacement bypass of R coronary artery, AAA, anemia, anxiety, CAD, CHF, depression, GERD, Heart failure, HLD, HTN, IBS, Kidney problems, migraine, osteoarthritis, osteoporosis, RA, ocular migraines, back pain are also affecting patient's functional outcome.   REHAB POTENTIAL: Fair    CLINICAL DECISION MAKING: Evolving/moderate complexity  EVALUATION COMPLEXITY: Moderate   GOALS: Goals reviewed with patient? Yes  SHORT TERM GOALS: Target date: 04/19/2024  Patient will be independent in home exercise program to improve strength/mobility for better functional independence with ADLs. Baseline:7/23: HEP given 9/16: compliant 1x/week 4/3: improving compliance 5/29: compliant Goal status: MET   LONG TERM GOALS: Target date: 09/06/24  Patient will increase FOTO score to equal to or greater than  57%   to demonstrate statistically significant improvement in mobility and quality of life.  Baseline: 7/22: 46 9/16: 50% 10/7: 40% 12/10: 57% Goal status: MET  2.  Patient will improve shoulder AROM to > 140 degrees of flexion, scaption, and abduction for improved ability to perform overhead activities. Baseline: 7/22: see above 9/16: see above 10/7 : defer to exacerbation 12/10: shoulder abduction >140, flexion < 140 , assess scaption next visit 2/6: flexion >140, abduction >140 scaption >140 Goal status: MET  3.  Patient will demonstrate adequate shoulder ROM and strength to be able to shave and dress  independently with pain less than 3/10. Baseline: 7/23: 6/10 pain with dressing, limited ROM, unable to test strength 9/16: 3/10 pain with dressing  Goal status: MET  4.  Patient will decrease Quick DASH score by > 8 points demonstrating reduced self-reported upper extremity disability for L and R shoulder  Baseline: 7/23: L shoulder 50% 9/16: R=54.54 % L 66% L 81% R 24% 12/10: R: 43.2% L: 56.8% 2/6: L 22%  R 34%  Goal status: MET  5.  Patient will be able to eat an entire meal without resting arm demonstrating improved functional strength Baseline: 7/23: has to rest while eating due to pain. 9/16: able to make halfway through meal 10/7: can make it about halfway through meal, some meals can get 3/4s through meal 12/10: improved, still has to rest every once in a while 2/6: can get halfway to 2/3rds of the meal. 5/29:  2/3rds of meal  Goal status: deferred at this time to focus on back/leg  6.  Patient will be able to stand 15 minutes without her LLE going numb allowing for ADLS such as showering and iADLs such as cooking Baseline: 5 minutes 12/10: will assess next visit 2/6: 10 minutes 4/3: 10 minutes 5/29: 5-8 minutes  7/24: 5 minutes- pt states that it is now in both of her legs 08/06/24: Pt reports she has not noticed any numbness in the feet Goal status: MET  7. Patient will increase six minute walk test distance to >1400 for progression to community ambulator and improve gait ability Baseline: 1160 ft  12/10: 1010 ft 2/6: 1140 ft  4/3: 1201 ft 5/29: 774 ft stopped with  1 min 35 seconds remaining due to shortness of breath 6/26: 939ft with one seated rest break at 1:40 seconds remaining due to SOB 7/24: 1110 ft- pt able to walk the entire 6 minutes without a break 08/06/24: 1202' with 2 standing rest breaks Goal status: UPDATED & IN PROGRESS  8  Patient will reduce modified Oswestry score to <20 as to demonstrate minimal disability with ADLs including improved sleeping tolerance,  walking/sitting tolerance etc for better mobility with ADLs.  Baseline: 2/6: 60%  4/3: 50%  7/24: 22/50 = 44% 08/06/24: 25/50: 50% 08/16/24: 10/50: 20% Goal status: IN PROGRESS  9  Patient will improve df strength by 2lb for improved functional ambulation with decreased foot drag  Baseline: 5/29: L 4.2 R 6.9 7/24: L 4-/5 R 4+/5 08/06/24: R: 11.93 peak; 7.9 average, L: 12.6 peak; 9.6 average Goal status: MET   PLAN:  PT FREQUENCY: 2x/week  PT DURATION: 8 weeks  PLANNED INTERVENTIONS: Therapeutic exercises, Therapeutic activity, Neuromuscular re-education, Balance training, Gait training, Patient/Family education, Self Care, Joint mobilization, Vestibular training, Canalith repositioning, Visual/preceptual remediation/compensation, Dry Needling, Electrical stimulation, Spinal mobilization, Cryotherapy, Moist heat, Compression bandaging, scar mobilization, Splintting, Taping, Traction, Ultrasound, Biofeedback, Ionotophoresis 4mg /ml Dexamethasone , Manual therapy, and Re-evaluation  PLAN FOR NEXT SESSION:   D/C     Fonda Simpers, PT, DPT Physical Therapist - [facility]  08/16/24, 4:18 PM

## 2024-08-21 ENCOUNTER — Ambulatory Visit (INDEPENDENT_AMBULATORY_CARE_PROVIDER_SITE_OTHER): Payer: Medicare HMO | Admitting: Psychology

## 2024-08-21 DIAGNOSIS — F331 Major depressive disorder, recurrent, moderate: Secondary | ICD-10-CM | POA: Diagnosis not present

## 2024-08-21 NOTE — Progress Notes (Signed)
 [facility] Counselor/Therapist Progress Note  Patient ID: TYCHELLE PURKEY, MRN: [id number],    Date: 08/21/2024  Time Spent: 10:00am-10:50am  50 minutes   Treatment Type: Individual Therapy  Reported Symptoms: stress, overwhelm  Mental Status Exam: Appearance:  Casual     Behavior: Appropriate  Motor: Normal  Speech/Language:  Normal Rate  Affect: Appropriate  Mood: normal  Thought process: normal  Thought content:   WNL  Sensory/Perceptual disturbances:   WNL  Orientation: oriented to person, place, time/date, and situation  Attention: Good  Concentration: Good  Memory: WNL  Fund of knowledge:  Good  Insight:   Good  Judgment:  Good  Impulse Control: Good   Risk Assessment: Danger to Self:  No Self-injurious Behavior: No Danger to Others: No Duty to Warn:no Physical Aggression / Violence:No  Access to Firearms a concern: No  Gang Involvement:No   Subjective: Pt present for face-to-face individual therapy via video.  Pt consents to telehealth video session and is aware of limitations and benefits of virtual sessions.   Location of pt: home Location of therapist: home office.   Pt talked about just spending 45 minutes trying to get [name] to take his medication.  He was refusing to open his mouth.   Pt is having a hard time feeling patient today because she did not sleep well last night and is very tired.  Pt gets overwhelmed with care giving for [name].  Pt gets frustrated with [name] cognitive challenges.  Addressed how this impacts pt.   Worked on Optician, dispensing.  Pt had to pay over $1,000 extra for care giving bc [facility] not [facility] all [facility] care giving [facility].  This was stressful for pt.  [facility]-3 weeks of resp[facility] care for [name] this fall.   Pt talked about working on organizing and trying to empty out her storage building.   Pt has trouble letting go of stuff and gets distracted when organizing so she is enlisting the  help of friends.   Worked on self care strategies. Provided supportive therapy.    Interventions: Cognitive Behavioral Therapy and Insight-Oriented  Diagnosis:  F33.1   Plan of Care: Recommend ongoing therapy.   Pt participated in setting treatment goals.   Pt needs a place to talk about stressors.  Plan to meet every week.  Pt agrees with treatment plan.    Treatment Plan (target date: 03/27/2025) Client Abilities/Strengths  Pt is bright, engaging, and motivated for therapy.   Client Treatment Preferences  Individual therapy.  Client Statement of Needs  Improve coping skills.  Have a place to talk about stressors.   Symptoms  Depressed or irritable mood. Unresolved grief issues.  Problems Addressed  Unipolar Depression Goals 1. Alleviate depressive symptoms and return to previous level of effective functioning. 2. Appropriately grieve the loss in order to normalize mood and to return to previously adaptive level of functioning. Objective Learn and implement behavioral strategies to overcome depression. Target Date: 2025-03-27 Frequency: weekly  Progress: 45 Modality: individual  Related Interventions Engage the client in behavioral activation, increasing his/her activity level and contact with sources of reward, while identifying processes that inhibit activation.  Use behavioral techniques such as instruction, rehearsal, role-playing, role reversal, as needed, to facilitate activity in the client's daily life; reinforce success. Assist the client in developing skills that increase the likelihood of deriving pleasure from behavioral activation (e.g., assertiveness skills, developing an exercise plan, less internal/more external focus,  increased social involvement); reinforce success. Objective Identify important people in life, past and present, and describe the quality, good and poor, of those relationships. Target Date: 2025-03-27 Frequency: weekly  Progress: 45 Modality:  individual  Related Interventions Conduct Interpersonal Therapy beginning with the assessment of the client's interpersonal inventory of important past and present relationships; develop a case formulation linking depression to grief, interpersonal role disputes, role transitions, and/or interpersonal deficits). Objective Learn and implement problem-solving and decision-making skills. Target Date: 2025-03-27 Frequency: weekly  Progress: 45 Modality: individual  Related Interventions Conduct Problem-Solving Therapy using techniques such as psychoeducation, modeling, and role-playing to teach client problem-solving skills (i.e., defining a problem specifically, generating possible solutions, evaluating the pros and cons of each solution, selecting and implementing a plan of action, evaluating the efficacy of the plan, accepting or revising the plan); role-play application of the problem-solving skill to a real life issue. Encourage in the client the development of a positive problem orientation in which problems and solving them are viewed as a natural part of life and not something to be feared, despaired, or avoided. 3. Develop healthy interpersonal relationships that lead to the alleviation and help prevent the relapse of depression. 4. Develop healthy thinking patterns and beliefs about self, others, and the world that lead to the alleviation and help prevent the relapse of depression. 5. Recognize, accept, and cope with feelings of depression. Diagnosis F33.1  Conditions For Discharge Achievement of treatment goals and objectives   Veva Alma, LCSW

## 2024-08-23 ENCOUNTER — Ambulatory Visit

## 2024-08-27 ENCOUNTER — Ambulatory Visit

## 2024-08-28 ENCOUNTER — Ambulatory Visit (INDEPENDENT_AMBULATORY_CARE_PROVIDER_SITE_OTHER): Payer: Medicare HMO | Admitting: Psychology

## 2024-08-28 DIAGNOSIS — F331 Major depressive disorder, recurrent, moderate: Secondary | ICD-10-CM

## 2024-08-28 NOTE — Progress Notes (Signed)
 [facility] Counselor/Therapist Progress Note  Patient ID: BIRDELLA SIPPEL, MRN: [id number],    Date: 08/28/2024  Time Spent: 10:00am-10:50am  50 minutes   Treatment Type: Individual Therapy  Reported Symptoms: stress, overwhelm  Mental Status Exam: Appearance:  Casual     Behavior: Appropriate  Motor: Normal  Speech/Language:  Normal Rate  Affect: Appropriate  Mood: normal  Thought process: normal  Thought content:   WNL  Sensory/Perceptual disturbances:   WNL  Orientation: oriented to person, place, time/date, and situation  Attention: Good  Concentration: Good  Memory: WNL  Fund of knowledge:  Good  Insight:   Good  Judgment:  Good  Impulse Control: Good   Risk Assessment: Danger to Self:  No Self-injurious Behavior: No Danger to Others: No Duty to Warn:no Physical Aggression / Violence:No  Access to Firearms a concern: No  Gang Involvement:No   Subjective: Pt present for face-to-face individual therapy via video.  Pt consents to telehealth video session and is aware of limitations and benefits of virtual sessions.   Location of pt: home Location of therapist: home office.   Pt talked about [name] being in the hospital since Saturday.   He has a UTI.  Pt is not sure when he will come home.  Pt is worried that [name] cognitive baseline may have declined.  Pt is worried that care giving may be more difficult and she is already overwhelmed.  Worked on Optician, dispensing.  Last week pt attended a care giving summit conference that was helpful.   Pt is working on getting respite care for [name] for a 2-3 weeks in [place].   Worked on self care strategies. Provided supportive therapy.    Interventions: Cognitive Behavioral Therapy and Insight-Oriented  Diagnosis:  F33.1   Plan of Care: Recommend ongoing therapy.   Pt participated in setting treatment goals.   Pt needs a place to talk about stressors.  Plan to meet every week.  Pt agrees with  treatment plan.    Treatment Plan (target date: 03/27/2025) Client Abilities/Strengths  Pt is bright, engaging, and motivated for therapy.   Client Treatment Preferences  Individual therapy.  Client Statement of Needs  Improve coping skills.  Have a place to talk about stressors.   Symptoms  Depressed or irritable mood. Unresolved grief issues.  Problems Addressed  Unipolar Depression Goals 1. Alleviate depressive symptoms and return to previous level of effective functioning. 2. Appropriately grieve the loss in order to normalize mood and to return to previously adaptive level of functioning. Objective Learn and implement behavioral strategies to overcome depression. Target Date: 2025-03-27 Frequency: weekly  Progress: 45 Modality: individual  Related Interventions Engage the client in behavioral activation, increasing his/her activity level and contact with sources of reward, while identifying processes that inhibit activation.  Use behavioral techniques such as instruction, rehearsal, role-playing, role reversal, as needed, to facilitate activity in the client's daily life; reinforce success. Assist the client in developing skills that increase the likelihood of deriving pleasure from behavioral activation (e.g., assertiveness skills, developing an exercise plan, less internal/more external focus, increased social involvement); reinforce success. Objective Identify important people in life, past and present, and describe the quality, good and poor, of those relationships. Target Date: 2025-03-27 Frequency: weekly  Progress: 45 Modality: individual  Related Interventions Conduct Interpersonal Therapy beginning with the assessment of the client's interpersonal inventory of important past and present relationships; develop a case formulation linking depression to grief, interpersonal role disputes, role transitions, and/or interpersonal deficits). Objective  Learn and implement  problem-solving and decision-making skills. Target Date: 2025-03-27 Frequency: weekly  Progress: 45 Modality: individual  Related Interventions Conduct Problem-Solving Therapy using techniques such as psychoeducation, modeling, and role-playing to teach client problem-solving skills (i.e., defining a problem specifically, generating possible solutions, evaluating the pros and cons of each solution, selecting and implementing a plan of action, evaluating the efficacy of the plan, accepting or revising the plan); role-play application of the problem-solving skill to a real life issue. Encourage in the client the development of a positive problem orientation in which problems and solving them are viewed as a natural part of life and not something to be feared, despaired, or avoided. 3. Develop healthy interpersonal relationships that lead to the alleviation and help prevent the relapse of depression. 4. Develop healthy thinking patterns and beliefs about self, others, and the world that lead to the alleviation and help prevent the relapse of depression. 5. Recognize, accept, and cope with feelings of depression. Diagnosis F33.1  Conditions For Discharge Achievement of treatment goals and objectives   Veva Alma, LCSW

## 2024-08-30 ENCOUNTER — Ambulatory Visit

## 2024-09-03 ENCOUNTER — Ambulatory Visit

## 2024-09-04 ENCOUNTER — Ambulatory Visit: Payer: Medicare HMO | Admitting: Psychology

## 2024-09-04 DIAGNOSIS — F331 Major depressive disorder, recurrent, moderate: Secondary | ICD-10-CM

## 2024-09-04 NOTE — Progress Notes (Signed)
 [facility] Counselor/Therapist Progress Note  Patient ID: DAJANE VALLI, MRN: [id number],    Date: 09/04/2024  Time Spent: 10:00am-10:50am  50 minutes   Treatment Type: Individual Therapy  Reported Symptoms: stress, overwhelm  Mental Status Exam: Appearance:  Casual     Behavior: Appropriate  Motor: Normal  Speech/Language:  Normal Rate  Affect: Appropriate  Mood: normal  Thought process: normal  Thought content:   WNL  Sensory/Perceptual disturbances:   WNL  Orientation: oriented to person, place, time/date, and situation  Attention: Good  Concentration: Good  Memory: WNL  Fund of knowledge:  Good  Insight:   Good  Judgment:  Good  Impulse Control: Good   Risk Assessment: Danger to Self:  No Self-injurious Behavior: No Danger to Others: No Duty to Warn:no Physical Aggression / Violence:No  Access to Firearms a concern: No  Gang Involvement:No   Subjective: Pt present for face-to-face individual therapy via video.  Pt consents to telehealth video session and is aware of limitations and benefits of virtual sessions.   Location of pt: home Location of therapist: home office.   Pt talked about [name].  He is still in the hospital bc he has not been able to work with physical therapy the way he needs to.  Pt states [name] is mad at her bc of leaving him in the hospital.  Pt has been visiting him most days.   [name] will probably come home on Friday.  Pt has felt like she had a little break since [name] has been in the hospital but she has had to visit him most days so that has taken a lot of time.   Pt is upset that [name] kids have not visited him in the hospital.   Pt is working on organizing things in her house and removing things from her storage building.  Pt gets overwhelmed with stuff management.   Pt states she is not sleeping well bc she is sleeping by herself.   She also worries about [name].   Worked on Optician, dispensing.  Worked on self care  strategies. Provided supportive therapy.    Interventions: Cognitive Behavioral Therapy and Insight-Oriented  Diagnosis:  F33.1   Plan of Care: Recommend ongoing therapy.   Pt participated in setting treatment goals.   Pt needs a place to talk about stressors.  Plan to meet every week.  Pt agrees with treatment plan.    Treatment Plan (target date: 03/27/2025) Client Abilities/Strengths  Pt is bright, engaging, and motivated for therapy.   Client Treatment Preferences  Individual therapy.  Client Statement of Needs  Improve coping skills.  Have a place to talk about stressors.   Symptoms  Depressed or irritable mood. Unresolved grief issues.  Problems Addressed  Unipolar Depression Goals 1. Alleviate depressive symptoms and return to previous level of effective functioning. 2. Appropriately grieve the loss in order to normalize mood and to return to previously adaptive level of functioning. Objective Learn and implement behavioral strategies to overcome depression. Target Date: 2025-03-27 Frequency: weekly  Progress: 45 Modality: individual  Related Interventions Engage the client in behavioral activation, increasing his/her activity level and contact with sources of reward, while identifying processes that inhibit activation.  Use behavioral techniques such as instruction, rehearsal, role-playing, role reversal, as needed, to facilitate activity in the client's daily life; reinforce success. Assist the client in developing skills that increase the likelihood of deriving pleasure from behavioral activation (e.g., assertiveness skills, developing an exercise plan, less internal/more external focus,  increased social involvement); reinforce success. Objective Identify important people in life, past and present, and describe the quality, good and poor, of those relationships. Target Date: 2025-03-27 Frequency: weekly  Progress: 45 Modality: individual  Related Interventions Conduct  Interpersonal Therapy beginning with the assessment of the client's interpersonal inventory of important past and present relationships; develop a case formulation linking depression to grief, interpersonal role disputes, role transitions, and/or interpersonal deficits). Objective Learn and implement problem-solving and decision-making skills. Target Date: 2025-03-27 Frequency: weekly  Progress: 45 Modality: individual  Related Interventions Conduct Problem-Solving Therapy using techniques such as psychoeducation, modeling, and role-playing to teach client problem-solving skills (i.e., defining a problem specifically, generating possible solutions, evaluating the pros and cons of each solution, selecting and implementing a plan of action, evaluating the efficacy of the plan, accepting or revising the plan); role-play application of the problem-solving skill to a real life issue. Encourage in the client the development of a positive problem orientation in which problems and solving them are viewed as a natural part of life and not something to be feared, despaired, or avoided. 3. Develop healthy interpersonal relationships that lead to the alleviation and help prevent the relapse of depression. 4. Develop healthy thinking patterns and beliefs about self, others, and the world that lead to the alleviation and help prevent the relapse of depression. 5. Recognize, accept, and cope with feelings of depression. Diagnosis F33.1  Conditions For Discharge Achievement of treatment goals and objectives   Veva Alma, LCSW

## 2024-09-06 ENCOUNTER — Ambulatory Visit

## 2024-09-10 ENCOUNTER — Ambulatory Visit

## 2024-09-11 ENCOUNTER — Ambulatory Visit (INDEPENDENT_AMBULATORY_CARE_PROVIDER_SITE_OTHER): Payer: Medicare HMO | Admitting: Psychology

## 2024-09-11 DIAGNOSIS — F331 Major depressive disorder, recurrent, moderate: Secondary | ICD-10-CM | POA: Diagnosis not present

## 2024-09-11 NOTE — Progress Notes (Signed)
 [facility] Counselor/Therapist Progress Note  Patient ID: Rachel Torres, MRN: [id number],    Date: 09/11/2024  Time Spent: 10:00am-10:50am  50 minutes   Treatment Type: Individual Therapy  Reported Symptoms: stress, overwhelm  Mental Status Exam: Appearance:  Casual     Behavior: Appropriate  Motor: Normal  Speech/Language:  Normal Rate  Affect: Appropriate  Mood: normal  Thought process: normal  Thought content:   WNL  Sensory/Perceptual disturbances:   WNL  Orientation: oriented to person, place, time/date, and situation  Attention: Good  Concentration: Good  Memory: WNL  Fund of knowledge:  Good  Insight:   Good  Judgment:  Good  Impulse Control: Good   Risk Assessment: Danger to Self:  No Self-injurious Behavior: No Danger to Others: No Duty to Warn:no Physical Aggression / Violence:No  Access to Firearms a concern: No  Gang Involvement:No   Subjective: Pt present for face-to-face individual therapy via video.  Pt consents to telehealth video session and is aware of limitations and benefits of virtual sessions.   Location of pt: home Location of therapist: home office.   Pt talked about [name].  He came home from the hospital on Friday and had to return on Sunday bc he had confusion and an elevated temperature.   [name] had a UTI and kidney stone that was blocking his urine output.  He is going to need to have a foley catheter indefinitely.     [name] had surgery yesterday to put in a stent.  Pt had been worried about [name].   Pt states she still is not sleeping very well bc of worries about [name].  She misses him an doesn't like sleeping alone.   Pt is working on organizing things in her house and removing things from her storage building.  Pt gets overwhelmed with stuff management.   Worked on Optician, dispensing.  Worked on self care strategies. Provided supportive therapy.    Interventions: Cognitive Behavioral Therapy and  Insight-Oriented  Diagnosis:  F33.1   Plan of Care: Recommend ongoing therapy.   Pt participated in setting treatment goals.   Pt needs a place to talk about stressors.  Plan to meet every week.  Pt agrees with treatment plan.    Treatment Plan (target date: 03/27/2025) Client Abilities/Strengths  Pt is bright, engaging, and motivated for therapy.   Client Treatment Preferences  Individual therapy.  Client Statement of Needs  Improve coping skills.  Have a place to talk about stressors.   Symptoms  Depressed or irritable mood. Unresolved grief issues.  Problems Addressed  Unipolar Depression Goals 1. Alleviate depressive symptoms and return to previous level of effective functioning. 2. Appropriately grieve the loss in order to normalize mood and to return to previously adaptive level of functioning. Objective Learn and implement behavioral strategies to overcome depression. Target Date: 2025-03-27 Frequency: weekly  Progress: 45 Modality: individual  Related Interventions Engage the client in behavioral activation, increasing his/her activity level and contact with sources of reward, while identifying processes that inhibit activation.  Use behavioral techniques such as instruction, rehearsal, role-playing, role reversal, as needed, to facilitate activity in the client's daily life; reinforce success. Assist the client in developing skills that increase the likelihood of deriving pleasure from behavioral activation (e.g., assertiveness skills, developing an exercise plan, less internal/more external focus, increased social involvement); reinforce success. Objective Identify important people in life, past and present, and describe the quality, good and poor, of those relationships. Target Date: 2025-03-27 Frequency: weekly  Progress:  45 Modality: individual  Related Interventions Conduct Interpersonal Therapy beginning with the assessment of the client's interpersonal inventory of  important past and present relationships; develop a case formulation linking depression to grief, interpersonal role disputes, role transitions, and/or interpersonal deficits). Objective Learn and implement problem-solving and decision-making skills. Target Date: 2025-03-27 Frequency: weekly  Progress: 45 Modality: individual  Related Interventions Conduct Problem-Solving Therapy using techniques such as psychoeducation, modeling, and role-playing to teach client problem-solving skills (i.e., defining a problem specifically, generating possible solutions, evaluating the pros and cons of each solution, selecting and implementing a plan of action, evaluating the efficacy of the plan, accepting or revising the plan); role-play application of the problem-solving skill to a real life issue. Encourage in the client the development of a positive problem orientation in which problems and solving them are viewed as a natural part of life and not something to be feared, despaired, or avoided. 3. Develop healthy interpersonal relationships that lead to the alleviation and help prevent the relapse of depression. 4. Develop healthy thinking patterns and beliefs about self, others, and the world that lead to the alleviation and help prevent the relapse of depression. 5. Recognize, accept, and cope with feelings of depression. Diagnosis F33.1  Conditions For Discharge Achievement of treatment goals and objectives   Veva Alma, LCSW

## 2024-09-13 ENCOUNTER — Ambulatory Visit

## 2024-09-14 ENCOUNTER — Telehealth: Payer: Self-pay | Admitting: Cardiovascular Disease

## 2024-09-14 MED ORDER — AMOXICILLIN 500 MG PO CAPS: 500.0000 mg | ORAL_CAPSULE | Freq: Once | ORAL | 0 refills | Status: AC

## 2024-09-14 NOTE — Telephone Encounter (Signed)
   Patient Name: Rachel Torres  DOB: 29-Jun-1956 MRN: [id number]  Primary Cardiologist: Evalene Lunger, MD  Chart reviewed as part of pre-operative protocol coverage.   IF SIMPLE EXTRACTION/CLEANINGS: Simple dental extractions (i.e. 1-2 teeth) are considered low risk procedures per guidelines and generally do not require any specific cardiac clearance. It is also generally accepted that for simple extractions and dental cleanings, there is no need to interrupt blood thinner therapy.   SBE prophylaxis is required for the patient from a cardiac standpoint. Amoxacillin has been ordered.  I will route this recommendation to the requesting party via Epic fax function and remove from pre-op pool.  Please call with questions.  Lamarr Satterfield, NP 09/14/2024, 10:56 AM

## 2024-09-14 NOTE — Telephone Encounter (Signed)
   Pre-operative Risk Assessment    Patient Name: Rachel Torres  DOB: 02-15-1956 MRN: [id number]   Date of last office visit: unknown Date of next office visit: unknown  Request for Surgical Clearance    Procedure:  Dental Extraction - Amount of Teeth to be Pulled:  1  Date of Surgery:  Clearance TBD                                Surgeon:  Dr. Marni Surgeon's Group or Practice Name:  [facility] Phone number:  [phone] Fax number:  [phone]   Type of Clearance Requested:   - Medical    Type of Anesthesia:  Not Indicated   Additional requests/questions:    SignedBerwyn LELON Sprung   09/14/2024, 10:37 AM

## 2024-09-17 ENCOUNTER — Ambulatory Visit

## 2024-09-18 ENCOUNTER — Ambulatory Visit: Payer: Medicare HMO | Admitting: Psychology

## 2024-09-18 DIAGNOSIS — F331 Major depressive disorder, recurrent, moderate: Secondary | ICD-10-CM

## 2024-09-18 NOTE — Progress Notes (Signed)
 [facility] Counselor/Therapist Progress Note  Patient ID: ZANYIAH POSTEN, MRN: [id number],    Date: 09/18/2024  Time Spent: 10:00am-10:50am  50 minutes   Treatment Type: Individual Therapy  Reported Symptoms: stress, overwhelm  Mental Status Exam: Appearance:  Casual     Behavior: Appropriate  Motor: Normal  Speech/Language:  Normal Rate  Affect: Appropriate  Mood: normal  Thought process: normal  Thought content:   WNL  Sensory/Perceptual disturbances:   WNL  Orientation: oriented to person, place, time/date, and situation  Attention: Good  Concentration: Good  Memory: WNL  Fund of knowledge:  Good  Insight:   Good  Judgment:  Good  Impulse Control: Good   Risk Assessment: Danger to Self:  No Self-injurious Behavior: No Danger to Others: No Duty to Warn:no Physical Aggression / Violence:No  Access to Firearms a concern: No  Gang Involvement:No   Subjective: Pt present for face-to-face individual therapy via video.  Pt consents to telehealth video session and is aware of limitations and benefits of virtual sessions.   Location of pt: home Location of therapist: home office.   Pt talked about [name].  He is still in the hospital.  He has not been able to progress with PT and OT so he can't be discharged yet. [name] has a catheter placed and will have that until sometime in November when he will have surgery.   Pt visits him every day but she is going to reduce her visits to every other day to manage her stress. Pt has been worried about [name] bc he has cried during her visits. Pt talked about being worried about her son who is deployed off the [address] of [place].  Addressed pt's worries.   Worked on Optician, dispensing.  Worked on self care strategies. Provided supportive therapy.    Interventions: Cognitive Behavioral Therapy and Insight-Oriented  Diagnosis:  F33.1   Plan of Care: Recommend ongoing therapy.   Pt participated in setting treatment goals.    Pt needs a place to talk about stressors.  Plan to meet every week.  Pt agrees with treatment plan.    Treatment Plan (target date: 03/27/2025) Client Abilities/Strengths  Pt is bright, engaging, and motivated for therapy.   Client Treatment Preferences  Individual therapy.  Client Statement of Needs  Improve coping skills.  Have a place to talk about stressors.   Symptoms  Depressed or irritable mood. Unresolved grief issues.  Problems Addressed  Unipolar Depression Goals 1. Alleviate depressive symptoms and return to previous level of effective functioning. 2. Appropriately grieve the loss in order to normalize mood and to return to previously adaptive level of functioning. Objective Learn and implement behavioral strategies to overcome depression. Target Date: 2025-03-27 Frequency: weekly  Progress: 45 Modality: individual  Related Interventions Engage the client in behavioral activation, increasing his/her activity level and contact with sources of reward, while identifying processes that inhibit activation.  Use behavioral techniques such as instruction, rehearsal, role-playing, role reversal, as needed, to facilitate activity in the client's daily life; reinforce success. Assist the client in developing skills that increase the likelihood of deriving pleasure from behavioral activation (e.g., assertiveness skills, developing an exercise plan, less internal/more external focus, increased social involvement); reinforce success. Objective Identify important people in life, past and present, and describe the quality, good and poor, of those relationships. Target Date: 2025-03-27 Frequency: weekly  Progress: 45 Modality: individual  Related Interventions Conduct Interpersonal Therapy beginning with the assessment of the client's interpersonal inventory of important past  and present relationships; develop a case formulation linking depression to grief, interpersonal role disputes,  role transitions, and/or interpersonal deficits). Objective Learn and implement problem-solving and decision-making skills. Target Date: 2025-03-27 Frequency: weekly  Progress: 45 Modality: individual  Related Interventions Conduct Problem-Solving Therapy using techniques such as psychoeducation, modeling, and role-playing to teach client problem-solving skills (i.e., defining a problem specifically, generating possible solutions, evaluating the pros and cons of each solution, selecting and implementing a plan of action, evaluating the efficacy of the plan, accepting or revising the plan); role-play application of the problem-solving skill to a real life issue. Encourage in the client the development of a positive problem orientation in which problems and solving them are viewed as a natural part of life and not something to be feared, despaired, or avoided. 3. Develop healthy interpersonal relationships that lead to the alleviation and help prevent the relapse of depression. 4. Develop healthy thinking patterns and beliefs about self, others, and the world that lead to the alleviation and help prevent the relapse of depression. 5. Recognize, accept, and cope with feelings of depression. Diagnosis F33.1  Conditions For Discharge Achievement of treatment goals and objectives   Veva Alma, LCSW

## 2024-09-20 ENCOUNTER — Ambulatory Visit

## 2024-09-24 ENCOUNTER — Ambulatory Visit

## 2024-09-25 ENCOUNTER — Ambulatory Visit: Payer: Medicare HMO | Admitting: Psychology

## 2024-09-26 ENCOUNTER — Other Ambulatory Visit: Payer: Self-pay | Admitting: Family Medicine

## 2024-09-27 ENCOUNTER — Ambulatory Visit

## 2024-10-01 ENCOUNTER — Ambulatory Visit

## 2024-10-02 ENCOUNTER — Ambulatory Visit: Payer: Medicare HMO | Admitting: Psychology

## 2024-10-02 DIAGNOSIS — H1131 Conjunctival hemorrhage, right eye: Secondary | ICD-10-CM | POA: Diagnosis not present

## 2024-10-02 DIAGNOSIS — F331 Major depressive disorder, recurrent, moderate: Secondary | ICD-10-CM | POA: Diagnosis not present

## 2024-10-02 NOTE — Progress Notes (Signed)
 [facility] Counselor/Therapist Progress Note  Patient ID: Rachel Torres, MRN: [id number],    Date: 10/02/2024  Time Spent: 10:00am-10:50am  50 minutes   Treatment Type: Individual Therapy  Reported Symptoms: stress, overwhelm  Mental Status Exam: Appearance:  Casual     Behavior: Appropriate  Motor: Normal  Speech/Language:  Normal Rate  Affect: Appropriate  Mood: normal  Thought process: normal  Thought content:   WNL  Sensory/Perceptual disturbances:   WNL  Orientation: oriented to person, place, time/date, and situation  Attention: Good  Concentration: Good  Memory: WNL  Fund of knowledge:  Good  Insight:   Good  Judgment:  Good  Impulse Control: Good   Risk Assessment: Danger to Self:  No Self-injurious Behavior: No Danger to Others: No Duty to Warn:no Physical Aggression / Violence:No  Access to Firearms a concern: No  Gang Involvement:No   Subjective: Pt present for face-to-face individual therapy via video.  Pt consents to telehealth video session and is aware of limitations and benefits of virtual sessions.   Location of pt: home Location of therapist: home office.   Pt talked about her health.  She had to go to urgent care this morning bc of an issue with her eye.   She is ok but uncomfortable.   Pt talked about Rachel Torres.   He has a foley catheter and is having to manage the care of the catheter several times a day.  Pt feels overwhelmed with the additional caregiving responsibilities.  Rachel Torres gets confused especially in the evenings so those are the hardest times for pt.  Pt has a lot of medical appointments to take Rachel Torres to and that she has for herself.  Pt talked about being worried about her son who is deployed off the [address] of [place].  Addressed pt's worries.   Worked on Optician, dispensing.  Worked on self care strategies. Provided supportive therapy.    Interventions: Cognitive Behavioral Therapy and Insight-Oriented  Diagnosis:   F33.1   Plan of Care: Recommend ongoing therapy.   Pt participated in setting treatment goals.   Pt needs a place to talk about stressors.  Plan to meet every week.  Pt agrees with treatment plan.    Treatment Plan (target date: 03/27/2025) Client Abilities/Strengths  Pt is bright, engaging, and motivated for therapy.   Client Treatment Preferences  Individual therapy.  Client Statement of Needs  Improve coping skills.  Have a place to talk about stressors.   Symptoms  Depressed or irritable mood. Unresolved grief issues.  Problems Addressed  Unipolar Depression Goals 1. Alleviate depressive symptoms and return to previous level of effective functioning. 2. Appropriately grieve the loss in order to normalize mood and to return to previously adaptive level of functioning. Objective Learn and implement behavioral strategies to overcome depression. Target Date: 2025-03-27 Frequency: weekly  Progress: 45 Modality: individual  Related Interventions Engage the client in behavioral activation, increasing his/her activity level and contact with sources of reward, while identifying processes that inhibit activation.  Use behavioral techniques such as instruction, rehearsal, role-playing, role reversal, as needed, to facilitate activity in the client's daily life; reinforce success. Assist the client in developing skills that increase the likelihood of deriving pleasure from behavioral activation (e.g., assertiveness skills, developing an exercise plan, less internal/more external focus, increased social involvement); reinforce success. Objective Identify important people in life, past and present, and describe the quality, good and poor, of those relationships. Target Date: 2025-03-27 Frequency: weekly  Progress: 45 Modality: individual  Related Interventions Conduct Interpersonal Therapy beginning with the assessment of the client's interpersonal inventory of important past and present  relationships; develop a case formulation linking depression to grief, interpersonal role disputes, role transitions, and/or interpersonal deficits). Objective Learn and implement problem-solving and decision-making skills. Target Date: 2025-03-27 Frequency: weekly  Progress: 45 Modality: individual  Related Interventions Conduct Problem-Solving Therapy using techniques such as psychoeducation, modeling, and role-playing to teach client problem-solving skills (i.e., defining a problem specifically, generating possible solutions, evaluating the pros and cons of each solution, selecting and implementing a plan of action, evaluating the efficacy of the plan, accepting or revising the plan); role-play application of the problem-solving skill to a real life issue. Encourage in the client the development of a positive problem orientation in which problems and solving them are viewed as a natural part of life and not something to be feared, despaired, or avoided. 3. Develop healthy interpersonal relationships that lead to the alleviation and help prevent the relapse of depression. 4. Develop healthy thinking patterns and beliefs about self, others, and the world that lead to the alleviation and help prevent the relapse of depression. 5. Recognize, accept, and cope with feelings of depression. Diagnosis F33.1  Conditions For Discharge Achievement of treatment goals and objectives   Veva Alma, LCSW

## 2024-10-02 NOTE — Progress Notes (Unsigned)
  Electrophysiology Office Follow up Visit Note:    Date:  10/03/2024   ID:  Rachel Torres, DOB 1956-05-21, MRN [id number]  PCP:  Randeen Laine LABOR, MD  [facility] Cardiologist:  Evalene Lunger, MD  [facility] Electrophysiologist:  Rachel ONEIDA HOLTS, MD    Interval History:     Rachel Torres is a 68 y.o. female who presents for a follow up visit.    I last saw the patient on May 30, 2024 for her atrial fibrillation and flutter.  At that appointment the patient elected to proceed with a conservative management strategy for her atrial fibrillation.  She wanted to cancel her A-fib ablation.  She was on Eliquis  for stroke prophylaxis.  She saw Rachel Torres in clinic June 27, 2024.  Today she is doing okay.  Her husband just got home from a month-long hospitalization.  He struggles with dementia.  He receives some of his care at the [facility].  She reports no arrhythmias.  She uses an Apple watch to monitor her heart rhythm.  She reports no burden greater than 2%.       Past medical, surgical, social and family history were reviewed.  ROS:   Please see the history of present illness.    All other systems reviewed and are negative.  EKGs/Labs/Other Studies Reviewed:    The following studies were reviewed today:          Physical Exam:    VS:  BP 120/80 (BP Location: Left Arm, Patient Position: Sitting, Cuff Size: Normal)   Pulse 80   Ht 5' 5 (1.651 m)   Wt 210 lb 8 oz (95.5 kg)   BMI 35.03 kg/m     Wt Readings from Last 3 Encounters:  10/03/24 210 lb 8 oz (95.5 kg)  07/18/24 215 lb 9.6 oz (97.8 kg)  07/13/24 215 lb 9.6 oz (97.8 kg)     GEN: no distress CARD: RRR, No MRG RESP: No IWOB. CTAB.      ASSESSMENT:    1. Heart failure with improved ejection fraction (HFimpEF) (HCC)   2. PAF (paroxysmal atrial fibrillation) (HCC)    PLAN:    In order of problems listed above:  #Atrial fibrillation flutter Previously associated with decompensated diastolic  heart failure.  Ablation was scheduled in the past and then canceled by the patient given she is the sole caregiver of her husband who has Alzheimer's. She is on Eliquis  for stroke prophylaxis. I again discussed treatment options for her atrial fibrillation.  For now, given the low burden of arrhythmia and her husband's complex medical issues, we will continue with a medical management strategy for her arrhythmia.  She will continue the Eliquis  and carvedilol .  #Chronic diastolic heart failure NYHA class II.  Rhythm control indicated as above On good GDMT.  I discussed my upcoming departure from [facility].  I will transition her care to Rachel Torres.  She will see him in about 1 year.   Signed, Rachel HOLTS, MD, [facility], [facility] 10/03/2024 9:11 AM    Electrophysiology [facility]

## 2024-10-03 ENCOUNTER — Ambulatory Visit: Attending: Cardiology | Admitting: Cardiology

## 2024-10-03 ENCOUNTER — Encounter: Payer: Self-pay | Admitting: Cardiology

## 2024-10-03 VITALS — BP 120/80 | HR 80 | Ht 65.0 in | Wt 210.5 lb

## 2024-10-03 DIAGNOSIS — I5020 Unspecified systolic (congestive) heart failure: Secondary | ICD-10-CM | POA: Diagnosis not present

## 2024-10-03 DIAGNOSIS — I480 Paroxysmal atrial fibrillation: Secondary | ICD-10-CM

## 2024-10-03 MED ORDER — CARVEDILOL 12.5 MG PO TABS: 12.5000 mg | ORAL_TABLET | Freq: Two times a day (BID) | ORAL | 3 refills | Status: DC

## 2024-10-03 NOTE — Patient Instructions (Signed)
 Medication Instructions:  Your physician has recommended you make the following change in your medication:  1) DECREASE Coreg  (carvedilol ) to 12.5 mg twice daily  *If you need a refill on your cardiac medications before your next appointment, please call your pharmacy  Follow-Up: At [facility], you and your health needs are our priority.  As part of our continuing mission to provide you with exceptional heart care, our providers are all part of one team.  This team includes your primary Cardiologist (physician) and Advanced Practice Providers or APPs (Physician Assistants and Nurse Practitioners) who all work together to provide you with the care you need, when you need it.  Your next appointment:   1 year  Provider:   Fonda Kitty, MD or Suzann Riddle, NP

## 2024-10-04 ENCOUNTER — Ambulatory Visit

## 2024-10-09 ENCOUNTER — Ambulatory Visit: Payer: Medicare HMO | Admitting: Psychology

## 2024-10-09 DIAGNOSIS — F331 Major depressive disorder, recurrent, moderate: Secondary | ICD-10-CM | POA: Diagnosis not present

## 2024-10-09 NOTE — Progress Notes (Signed)
 [facility] Counselor/Therapist Progress Note  Patient ID: Rachel Torres, MRN: [id number],    Date: 10/09/2024  Time Spent: 10:00am-10:55am  55 minutes   Treatment Type: Individual Therapy  Reported Symptoms: stress, overwhelm  Mental Status Exam: Appearance:  Casual     Behavior: Appropriate  Motor: Normal  Speech/Language:  Normal Rate  Affect: Appropriate  Mood: normal  Thought process: normal  Thought content:   WNL  Sensory/Perceptual disturbances:   WNL  Orientation: oriented to person, place, time/date, and situation  Attention: Good  Concentration: Good  Memory: WNL  Fund of knowledge:  Good  Insight:   Good  Judgment:  Good  Impulse Control: Good   Risk Assessment: Danger to Self:  No Self-injurious Behavior: No Danger to Others: No Duty to Warn:no Physical Aggression / Violence:No  Access to Firearms a concern: No  Gang Involvement:No   Subjective: Pt present for face-to-face individual therapy via video.  Pt consents to telehealth video session and is aware of limitations and benefits of virtual sessions.   Location of pt: home Location of therapist: home office.   Pt talked about her health.  Pt is having a dental extraction next week.  Pt is still needing to increase her strength so she can manage the walks from parking lot to the [facility].  Pt talked about [name].   He has a foley catheter and is having to manage the care of the catheter several times a day.  Pt feels overwhelmed with the additional caregiving responsibilities.  [name] had another UTI last week so he was more confused.  [name] kept pt up in the middle of the night so she is exhausted.   [name] has been difficult to manage and gives pt and the other care givers a hard time.   Pt talked about her great niece.  She has mental health issues and pt is helping her apply for disability.  Pt worries about her niece and gives her some jobs around her house.   Pt talked about being worried about  her son who is deployed off the [address] of [place].  Addressed pt's worries.   Worked on Optician, dispensing.  Worked on self care strategies. Provided supportive therapy.    Interventions: Cognitive Behavioral Therapy and Insight-Oriented  Diagnosis:  F33.1   Plan of Care: Recommend ongoing therapy.   Pt participated in setting treatment goals.   Pt needs a place to talk about stressors.  Plan to meet every week.  Pt agrees with treatment plan.    Treatment Plan (target date: 03/27/2025) Client Abilities/Strengths  Pt is bright, engaging, and motivated for therapy.   Client Treatment Preferences  Individual therapy.  Client Statement of Needs  Improve coping skills.  Have a place to talk about stressors.   Symptoms  Depressed or irritable mood. Unresolved grief issues.  Problems Addressed  Unipolar Depression Goals 1. Alleviate depressive symptoms and return to previous level of effective functioning. 2. Appropriately grieve the loss in order to normalize mood and to return to previously adaptive level of functioning. Objective Learn and implement behavioral strategies to overcome depression. Target Date: 2025-03-27 Frequency: weekly  Progress: 45 Modality: individual  Related Interventions Engage the client in behavioral activation, increasing his/her activity level and contact with sources of reward, while identifying processes that inhibit activation.  Use behavioral techniques such as instruction, rehearsal, role-playing, role reversal, as needed, to facilitate activity in the client's daily life; reinforce success. Assist the client in developing skills that increase  the likelihood of deriving pleasure from behavioral activation (e.g., assertiveness skills, developing an exercise plan, less internal/more external focus, increased social involvement); reinforce success. Objective Identify important people in life, past and present, and describe the quality, good and poor, of  those relationships. Target Date: 2025-03-27 Frequency: weekly  Progress: 45 Modality: individual  Related Interventions Conduct Interpersonal Therapy beginning with the assessment of the client's interpersonal inventory of important past and present relationships; develop a case formulation linking depression to grief, interpersonal role disputes, role transitions, and/or interpersonal deficits). Objective Learn and implement problem-solving and decision-making skills. Target Date: 2025-03-27 Frequency: weekly  Progress: 45 Modality: individual  Related Interventions Conduct Problem-Solving Therapy using techniques such as psychoeducation, modeling, and role-playing to teach client problem-solving skills (i.e., defining a problem specifically, generating possible solutions, evaluating the pros and cons of each solution, selecting and implementing a plan of action, evaluating the efficacy of the plan, accepting or revising the plan); role-play application of the problem-solving skill to a real life issue. Encourage in the client the development of a positive problem orientation in which problems and solving them are viewed as a natural part of life and not something to be feared, despaired, or avoided. 3. Develop healthy interpersonal relationships that lead to the alleviation and help prevent the relapse of depression. 4. Develop healthy thinking patterns and beliefs about self, others, and the world that lead to the alleviation and help prevent the relapse of depression. 5. Recognize, accept, and cope with feelings of depression. Diagnosis F33.1  Conditions For Discharge Achievement of treatment goals and objectives   Veva Alma, LCSW

## 2024-10-16 ENCOUNTER — Ambulatory Visit: Payer: Medicare HMO | Admitting: Psychology

## 2024-10-17 ENCOUNTER — Ambulatory Visit: Admitting: Psychology

## 2024-10-17 DIAGNOSIS — F331 Major depressive disorder, recurrent, moderate: Secondary | ICD-10-CM | POA: Diagnosis not present

## 2024-10-17 NOTE — Progress Notes (Signed)
 [facility] Counselor/Therapist Progress Note  Patient ID: SOL ODOR, MRN: [id number],    Date: 10/17/2024  Time Spent: 2:00pm-2:55pm  55 minutes   Treatment Type: Individual Therapy  Reported Symptoms: stress, overwhelm  Mental Status Exam: Appearance:  Casual     Behavior: Appropriate  Motor: Normal  Speech/Language:  Normal Rate  Affect: Appropriate  Mood: normal  Thought process: normal  Thought content:   WNL  Sensory/Perceptual disturbances:   WNL  Orientation: oriented to person, place, time/date, and situation  Attention: Good  Concentration: Good  Memory: WNL  Fund of knowledge:  Good  Insight:   Good  Judgment:  Good  Impulse Control: Good   Risk Assessment: Danger to Self:  No Self-injurious Behavior: No Danger to Others: No Duty to Warn:no Physical Aggression / Violence:No  Access to Firearms a concern: No  Gang Involvement:No   Subjective: Pt present for face-to-face individual therapy in person. Today is pt's birthday and she is feeling good about the number of family and friends who are sending her birthday wishes.   Pt talked about [name].  Pt feels overwhelmed with the additional caregiving responsibilities.    [name] has been difficult to manage and gives pt and the other care givers a hard time.  Pt feels like [name] is getting worse every day.  He refuses to use his walker.  He has fallen so many times that any times he hits his head again he will need a CAT scan to check for a brain bleed.  Addressed pt's concerns about [name] and helped her problem solve about care giving issues.    Pt talked about being worried about her son who is deployed off the [address] of [place].  Addressed pt's worries.   Worked on optician, dispensing.  Worked on self care strategies. Provided supportive therapy.    Interventions: Cognitive Behavioral Therapy and Insight-Oriented  Diagnosis:  F33.1   Plan of Care: Recommend ongoing therapy.   Pt  participated in setting treatment goals.   Pt needs a place to talk about stressors.  Plan to meet every week.  Pt agrees with treatment plan.    Treatment Plan (target date: 03/27/2025) Client Abilities/Strengths  Pt is bright, engaging, and motivated for therapy.   Client Treatment Preferences  Individual therapy.  Client Statement of Needs  Improve coping skills.  Have a place to talk about stressors.   Symptoms  Depressed or irritable mood. Unresolved grief issues.  Problems Addressed  Unipolar Depression Goals 1. Alleviate depressive symptoms and return to previous level of effective functioning. 2. Appropriately grieve the loss in order to normalize mood and to return to previously adaptive level of functioning. Objective Learn and implement behavioral strategies to overcome depression. Target Date: 2025-03-27 Frequency: weekly  Progress: 45 Modality: individual  Related Interventions Engage the client in behavioral activation, increasing his/her activity level and contact with sources of reward, while identifying processes that inhibit activation.  Use behavioral techniques such as instruction, rehearsal, role-playing, role reversal, as needed, to facilitate activity in the client's daily life; reinforce success. Assist the client in developing skills that increase the likelihood of deriving pleasure from behavioral activation (e.g., assertiveness skills, developing an exercise plan, less internal/more external focus, increased social involvement); reinforce success. Objective Identify important people in life, past and present, and describe the quality, good and poor, of those relationships. Target Date: 2025-03-27 Frequency: weekly  Progress: 45 Modality: individual  Related Interventions Conduct Interpersonal Therapy beginning with the assessment of the  client's interpersonal inventory of important past and present relationships; develop a case formulation linking depression  to grief, interpersonal role disputes, role transitions, and/or interpersonal deficits). Objective Learn and implement problem-solving and decision-making skills. Target Date: 2025-03-27 Frequency: weekly  Progress: 45 Modality: individual  Related Interventions Conduct Problem-Solving Therapy using techniques such as psychoeducation, modeling, and role-playing to teach client problem-solving skills (i.e., defining a problem specifically, generating possible solutions, evaluating the pros and cons of each solution, selecting and implementing a plan of action, evaluating the efficacy of the plan, accepting or revising the plan); role-play application of the problem-solving skill to a real life issue. Encourage in the client the development of a positive problem orientation in which problems and solving them are viewed as a natural part of life and not something to be feared, despaired, or avoided. 3. Develop healthy interpersonal relationships that lead to the alleviation and help prevent the relapse of depression. 4. Develop healthy thinking patterns and beliefs about self, others, and the world that lead to the alleviation and help prevent the relapse of depression. 5. Recognize, accept, and cope with feelings of depression. Diagnosis F33.1  Conditions For Discharge Achievement of treatment goals and objectives   Veva Alma, LCSW

## 2024-10-18 DIAGNOSIS — F411 Generalized anxiety disorder: Secondary | ICD-10-CM | POA: Diagnosis not present

## 2024-10-18 DIAGNOSIS — I4892 Unspecified atrial flutter: Secondary | ICD-10-CM | POA: Diagnosis not present

## 2024-10-18 DIAGNOSIS — Z8249 Family history of ischemic heart disease and other diseases of the circulatory system: Secondary | ICD-10-CM | POA: Diagnosis not present

## 2024-10-18 DIAGNOSIS — Z833 Family history of diabetes mellitus: Secondary | ICD-10-CM | POA: Diagnosis not present

## 2024-10-18 DIAGNOSIS — F324 Major depressive disorder, single episode, in partial remission: Secondary | ICD-10-CM | POA: Diagnosis not present

## 2024-10-18 DIAGNOSIS — F4320 Adjustment disorder, unspecified: Secondary | ICD-10-CM | POA: Diagnosis not present

## 2024-10-18 DIAGNOSIS — I4430 Unspecified atrioventricular block: Secondary | ICD-10-CM | POA: Diagnosis not present

## 2024-10-18 DIAGNOSIS — I509 Heart failure, unspecified: Secondary | ICD-10-CM | POA: Diagnosis not present

## 2024-10-18 DIAGNOSIS — J45909 Unspecified asthma, uncomplicated: Secondary | ICD-10-CM | POA: Diagnosis not present

## 2024-10-18 DIAGNOSIS — M62838 Other muscle spasm: Secondary | ICD-10-CM | POA: Diagnosis not present

## 2024-10-18 DIAGNOSIS — I130 Hypertensive heart and chronic kidney disease with heart failure and stage 1 through stage 4 chronic kidney disease, or unspecified chronic kidney disease: Secondary | ICD-10-CM | POA: Diagnosis not present

## 2024-10-18 DIAGNOSIS — Z7982 Long term (current) use of aspirin: Secondary | ICD-10-CM | POA: Diagnosis not present

## 2024-10-18 DIAGNOSIS — G4733 Obstructive sleep apnea (adult) (pediatric): Secondary | ICD-10-CM | POA: Diagnosis not present

## 2024-10-18 DIAGNOSIS — I4891 Unspecified atrial fibrillation: Secondary | ICD-10-CM | POA: Diagnosis not present

## 2024-10-18 DIAGNOSIS — H269 Unspecified cataract: Secondary | ICD-10-CM | POA: Diagnosis not present

## 2024-10-18 DIAGNOSIS — Z7901 Long term (current) use of anticoagulants: Secondary | ICD-10-CM | POA: Diagnosis not present

## 2024-10-18 DIAGNOSIS — K5900 Constipation, unspecified: Secondary | ICD-10-CM | POA: Diagnosis not present

## 2024-10-18 DIAGNOSIS — Z6835 Body mass index (BMI) 35.0-35.9, adult: Secondary | ICD-10-CM | POA: Diagnosis not present

## 2024-10-18 DIAGNOSIS — N182 Chronic kidney disease, stage 2 (mild): Secondary | ICD-10-CM | POA: Diagnosis not present

## 2024-10-18 DIAGNOSIS — I25119 Atherosclerotic heart disease of native coronary artery with unspecified angina pectoris: Secondary | ICD-10-CM | POA: Diagnosis not present

## 2024-10-18 DIAGNOSIS — R32 Unspecified urinary incontinence: Secondary | ICD-10-CM | POA: Diagnosis not present

## 2024-10-18 DIAGNOSIS — I429 Cardiomyopathy, unspecified: Secondary | ICD-10-CM | POA: Diagnosis not present

## 2024-10-18 DIAGNOSIS — I719 Aortic aneurysm of unspecified site, without rupture: Secondary | ICD-10-CM | POA: Diagnosis not present

## 2024-10-18 DIAGNOSIS — M0550 Rheumatoid polyneuropathy with rheumatoid arthritis of unspecified site: Secondary | ICD-10-CM | POA: Diagnosis not present

## 2024-10-18 DIAGNOSIS — M810 Age-related osteoporosis without current pathological fracture: Secondary | ICD-10-CM | POA: Diagnosis not present

## 2024-10-23 ENCOUNTER — Ambulatory Visit: Payer: Medicare HMO | Admitting: Psychology

## 2024-10-23 DIAGNOSIS — F331 Major depressive disorder, recurrent, moderate: Secondary | ICD-10-CM

## 2024-10-23 NOTE — Progress Notes (Signed)
 [facility] Counselor/Therapist Progress Note  Patient ID: Rachel Torres, MRN: [id number],    Date: 10/23/2024  Time Spent: 10:00am-10:55pm  55 minutes   Treatment Type: Individual Therapy  Reported Symptoms: stress, overwhelm  Mental Status Exam: Appearance:  Casual     Behavior: Appropriate  Motor: Normal  Speech/Language:  Normal Rate  Affect: Appropriate  Mood: normal  Thought process: normal  Thought content:   WNL  Sensory/Perceptual disturbances:   WNL  Orientation: oriented to person, place, time/date, and situation  Attention: Good  Concentration: Good  Memory: WNL  Fund of knowledge:  Good  Insight:   Good  Judgment:  Good  Impulse Control: Good   Risk Assessment: Danger to Self:  No Self-injurious Behavior: No Danger to Others: No Duty to Warn:no Physical Aggression / Violence:No  Access to Firearms a concern: No  Gang Involvement:No   Subjective: Pt present for face-to-face individual therapy via video.  Pt consents to telehealth video session and is aware of limitations and benefits of virtual sessions. Location of pt: home Location of therapist: home office.  Pt talked about her health.  She had to have a tooth extraction and is on a soft diet.  She is in some pain.  Pt is also going to need cataract surgery in a couple of months.  [facility] bc [facility] are not giving her the care giver support she needs.  Pt is having to pay out of pocket and she is worrying about the money.   Pt talked about care giving for [name].  She is having to cut back the hours for care giving help bc of finances.  This puts more of the care giving on pt which is stressful.   [name] will be having surgery next week and pt is hoping they will remove his catheter.   Worked on optician, dispensing.  Worked on self care strategies. Provided supportive therapy.    Interventions: Cognitive Behavioral Therapy and Insight-Oriented  Diagnosis:  F33.1   Plan  of Care: Recommend ongoing therapy.   Pt participated in setting treatment goals.   Pt needs a place to talk about stressors.  Plan to meet every week.  Pt agrees with treatment plan.    Treatment Plan (target date: 03/27/2025) Client Abilities/Strengths  Pt is bright, engaging, and motivated for therapy.   Client Treatment Preferences  Individual therapy.  Client Statement of Needs  Improve coping skills.  Have a place to talk about stressors.   Symptoms  Depressed or irritable mood. Unresolved grief issues.  Problems Addressed  Unipolar Depression Goals 1. Alleviate depressive symptoms and return to previous level of effective functioning. 2. Appropriately grieve the loss in order to normalize mood and to return to previously adaptive level of functioning. Objective Learn and implement behavioral strategies to overcome depression. Target Date: 2025-03-27 Frequency: weekly  Progress: 45 Modality: individual  Related Interventions Engage the client in behavioral activation, increasing his/her activity level and contact with sources of reward, while identifying processes that inhibit activation.  Use behavioral techniques such as instruction, rehearsal, role-playing, role reversal, as needed, to facilitate activity in the client's daily life; reinforce success. Assist the client in developing skills that increase the likelihood of deriving pleasure from behavioral activation (e.g., assertiveness skills, developing an exercise plan, less internal/more external focus, increased social involvement); reinforce success. Objective Identify important people in life, past and present, and describe the quality, good and poor, of those relationships. Target Date: 2025-03-27 Frequency:  weekly  Progress: 45 Modality: individual  Related Interventions Conduct Interpersonal Therapy beginning with the assessment of the client's interpersonal inventory of important past and present relationships;  develop a case formulation linking depression to grief, interpersonal role disputes, role transitions, and/or interpersonal deficits). Objective Learn and implement problem-solving and decision-making skills. Target Date: 2025-03-27 Frequency: weekly  Progress: 45 Modality: individual  Related Interventions Conduct Problem-Solving Therapy using techniques such as psychoeducation, modeling, and role-playing to teach client problem-solving skills (i.e., defining a problem specifically, generating possible solutions, evaluating the pros and cons of each solution, selecting and implementing a plan of action, evaluating the efficacy of the plan, accepting or revising the plan); role-play application of the problem-solving skill to a real life issue. Encourage in the client the development of a positive problem orientation in which problems and solving them are viewed as a natural part of life and not something to be feared, despaired, or avoided. 3. Develop healthy interpersonal relationships that lead to the alleviation and help prevent the relapse of depression. 4. Develop healthy thinking patterns and beliefs about self, others, and the world that lead to the alleviation and help prevent the relapse of depression. 5. Recognize, accept, and cope with feelings of depression. Diagnosis F33.1  Conditions For Discharge Achievement of treatment goals and objectives   Veva Alma, LCSW

## 2024-10-30 ENCOUNTER — Ambulatory Visit: Payer: Medicare HMO | Admitting: Psychology

## 2024-10-30 DIAGNOSIS — F331 Major depressive disorder, recurrent, moderate: Secondary | ICD-10-CM | POA: Diagnosis not present

## 2024-10-30 NOTE — Progress Notes (Signed)
 [facility] Counselor/Therapist Progress Note  Patient ID: Rachel Torres, MRN: [id number],    Date: 10/30/2024  Time Spent: 10:00am-10:45pm  45 minutes   Treatment Type: Individual Therapy  Reported Symptoms: stress, overwhelm  Mental Status Exam: Appearance:  Casual     Behavior: Appropriate  Motor: Normal  Speech/Language:  Normal Rate  Affect: Appropriate  Mood: normal  Thought process: normal  Thought content:   WNL  Sensory/Perceptual disturbances:   WNL  Orientation: oriented to person, place, time/date, and situation  Attention: Good  Concentration: Good  Memory: WNL  Fund of knowledge:  Good  Insight:   Good  Judgment:  Good  Impulse Control: Good   Risk Assessment: Danger to Self:  No Self-injurious Behavior: No Danger to Others: No Duty to Warn:no Physical Aggression / Violence:No  Access to Firearms a concern: No  Gang Involvement:No   Subjective: Pt present for face-to-face individual therapy via video.  Pt consents to telehealth video session and is aware of limitations and benefits of virtual sessions. Location of pt: home Location of therapist: home office.  Pt talked about care giving for Rachel Torres.  Rachel Torres had surgery and it went well but he still has his catheter.  Pt states the surgery day was a long hard day.   Pt is very tired today.  She has a care giver at the home for Rachel Torres for a few hours so she will be able to pace herself today.  Pt talked about her family.  Rachel Torres's daughter asked to come live with them bc she is being evicted from her apartment.  Pt did a good job setting boundaries and saying no.  Worked on optician, dispensing.  Worked on self care strategies. Provided supportive therapy.    Interventions: Cognitive Behavioral Therapy and Insight-Oriented  Diagnosis:  F33.1   Plan of Care: Recommend ongoing therapy.   Pt participated in setting treatment goals.   Pt needs a place to talk about stressors.  Plan to meet every  week.  Pt agrees with treatment plan.    Treatment Plan (target date: 03/27/2025) Client Abilities/Strengths  Pt is bright, engaging, and motivated for therapy.   Client Treatment Preferences  Individual therapy.  Client Statement of Needs  Improve coping skills.  Have a place to talk about stressors.   Symptoms  Depressed or irritable mood. Unresolved grief issues.  Problems Addressed  Unipolar Depression Goals 1. Alleviate depressive symptoms and return to previous level of effective functioning. 2. Appropriately grieve the loss in order to normalize mood and to return to previously adaptive level of functioning. Objective Learn and implement behavioral strategies to overcome depression. Target Date: 2025-03-27 Frequency: weekly  Progress: 45 Modality: individual  Related Interventions Engage the client in behavioral activation, increasing his/her activity level and contact with sources of reward, while identifying processes that inhibit activation.  Use behavioral techniques such as instruction, rehearsal, role-playing, role reversal, as needed, to facilitate activity in the client's daily life; reinforce success. Assist the client in developing skills that increase the likelihood of deriving pleasure from behavioral activation (e.g., assertiveness skills, developing an exercise plan, less internal/more external focus, increased social involvement); reinforce success. Objective Identify important people in life, past and present, and describe the quality, good and poor, of those relationships. Target Date: 2025-03-27 Frequency: weekly  Progress: 45 Modality: individual  Related Interventions Conduct Interpersonal Therapy beginning with the assessment of the client's interpersonal inventory of important past and present relationships; develop a case formulation linking depression to  grief, interpersonal role disputes, role transitions, and/or interpersonal  deficits). Objective Learn and implement problem-solving and decision-making skills. Target Date: 2025-03-27 Frequency: weekly  Progress: 45 Modality: individual  Related Interventions Conduct Problem-Solving Therapy using techniques such as psychoeducation, modeling, and role-playing to teach client problem-solving skills (i.e., defining a problem specifically, generating possible solutions, evaluating the pros and cons of each solution, selecting and implementing a plan of action, evaluating the efficacy of the plan, accepting or revising the plan); role-play application of the problem-solving skill to a real life issue. Encourage in the client the development of a positive problem orientation in which problems and solving them are viewed as a natural part of life and not something to be feared, despaired, or avoided. 3. Develop healthy interpersonal relationships that lead to the alleviation and help prevent the relapse of depression. 4. Develop healthy thinking patterns and beliefs about self, others, and the world that lead to the alleviation and help prevent the relapse of depression. 5. Recognize, accept, and cope with feelings of depression. Diagnosis F33.1  Conditions For Discharge Achievement of treatment goals and objectives   Veva Alma, LCSW

## 2024-11-06 ENCOUNTER — Ambulatory Visit: Payer: Medicare HMO | Admitting: Psychology

## 2024-11-06 DIAGNOSIS — F331 Major depressive disorder, recurrent, moderate: Secondary | ICD-10-CM

## 2024-11-06 NOTE — Progress Notes (Signed)
 [facility] Counselor/Therapist Progress Note  Patient ID: MEKENNA FINAU, MRN: [id number],    Date: 11/06/2024  Time Spent: 10:00am-10:55pm  55 minutes   Treatment Type: Individual Therapy  Reported Symptoms: stress, overwhelm  Mental Status Exam: Appearance:  Casual     Behavior: Appropriate  Motor: Normal  Speech/Language:  Normal Rate  Affect: Appropriate  Mood: normal  Thought process: normal  Thought content:   WNL  Sensory/Perceptual disturbances:   WNL  Orientation: oriented to person, place, time/date, and situation  Attention: Good  Concentration: Good  Memory: WNL  Fund of knowledge:  Good  Insight:   Good  Judgment:  Good  Impulse Control: Good   Risk Assessment: Danger to Self:  No Self-injurious Behavior: No Danger to Others: No Duty to Warn:no Physical Aggression / Violence:No  Access to Firearms a concern: No  Gang Involvement:No   Subjective: Pt present for face-to-face individual therapy via video.  Pt consents to telehealth video session and is aware of limitations and benefits of virtual sessions. Location of pt: home Location of therapist: home office.  Pt talked about care giving for [name].  This past weekend [name] did not know who pt was.   He was confused and his mind was 30 years in the past.  It lasted for all of Saturday night.  [name] recognized pt the next day.  This was the first time [name] had not known who pt was and it was very upsetting for her.  Pt took [name] to the [facility] and he was diagnosed with a UTI which may have caused the confusion.   Pt talked about her family.  Pt is hosting everyone for Thanksgiving at her house.  Pt feels a little overwhelmed about the preparations.  Pt talked about her health.   She is not seeing well bc of cataracts and is waiting to be approved for cataract surgery.   Worked on optician, dispensing.  Worked on self care strategies. Provided supportive therapy.    Interventions: Cognitive  Behavioral Therapy and Insight-Oriented  Diagnosis:  F33.1   Plan of Care: Recommend ongoing therapy.   Pt participated in setting treatment goals.   Pt needs a place to talk about stressors.  Plan to meet every week.  Pt agrees with treatment plan.    Treatment Plan (target date: 03/27/2025) Client Abilities/Strengths  Pt is bright, engaging, and motivated for therapy.   Client Treatment Preferences  Individual therapy.  Client Statement of Needs  Improve coping skills.  Have a place to talk about stressors.   Symptoms  Depressed or irritable mood. Unresolved grief issues.  Problems Addressed  Unipolar Depression Goals 1. Alleviate depressive symptoms and return to previous level of effective functioning. 2. Appropriately grieve the loss in order to normalize mood and to return to previously adaptive level of functioning. Objective Learn and implement behavioral strategies to overcome depression. Target Date: 2025-03-27 Frequency: weekly  Progress: 45 Modality: individual  Related Interventions Engage the client in behavioral activation, increasing his/her activity level and contact with sources of reward, while identifying processes that inhibit activation.  Use behavioral techniques such as instruction, rehearsal, role-playing, role reversal, as needed, to facilitate activity in the client's daily life; reinforce success. Assist the client in developing skills that increase the likelihood of deriving pleasure from behavioral activation (e.g., assertiveness skills, developing an exercise plan, less internal/more external focus, increased social involvement); reinforce success. Objective Identify important people in life, past and present, and describe the quality, good and  poor, of those relationships. Target Date: 2025-03-27 Frequency: weekly  Progress: 45 Modality: individual  Related Interventions Conduct Interpersonal Therapy beginning with the assessment of the client's  interpersonal inventory of important past and present relationships; develop a case formulation linking depression to grief, interpersonal role disputes, role transitions, and/or interpersonal deficits). Objective Learn and implement problem-solving and decision-making skills. Target Date: 2025-03-27 Frequency: weekly  Progress: 45 Modality: individual  Related Interventions Conduct Problem-Solving Therapy using techniques such as psychoeducation, modeling, and role-playing to teach client problem-solving skills (i.e., defining a problem specifically, generating possible solutions, evaluating the pros and cons of each solution, selecting and implementing a plan of action, evaluating the efficacy of the plan, accepting or revising the plan); role-play application of the problem-solving skill to a real life issue. Encourage in the client the development of a positive problem orientation in which problems and solving them are viewed as a natural part of life and not something to be feared, despaired, or avoided. 3. Develop healthy interpersonal relationships that lead to the alleviation and help prevent the relapse of depression. 4. Develop healthy thinking patterns and beliefs about self, others, and the world that lead to the alleviation and help prevent the relapse of depression. 5. Recognize, accept, and cope with feelings of depression. Diagnosis F33.1  Conditions For Discharge Achievement of treatment goals and objectives   Veva Alma, LCSW

## 2024-11-09 DIAGNOSIS — H16403 Unspecified corneal neovascularization, bilateral: Secondary | ICD-10-CM | POA: Diagnosis not present

## 2024-11-09 DIAGNOSIS — H25813 Combined forms of age-related cataract, bilateral: Secondary | ICD-10-CM | POA: Diagnosis not present

## 2024-11-13 ENCOUNTER — Ambulatory Visit: Payer: Medicare HMO | Admitting: Psychology

## 2024-11-13 DIAGNOSIS — F331 Major depressive disorder, recurrent, moderate: Secondary | ICD-10-CM | POA: Diagnosis not present

## 2024-11-13 NOTE — Progress Notes (Signed)
 [facility] Counselor/Therapist Progress Note  Patient ID: Rachel Torres, MRN: [id number],    Date: 11/13/2024  Time Spent: 10:00am-10:45pm  45 minutes   Treatment Type: Individual Therapy  Reported Symptoms: stress, overwhelm  Mental Status Exam: Appearance:  Casual     Behavior: Appropriate  Motor: Normal  Speech/Language:  Normal Rate  Affect: Appropriate  Mood: normal  Thought process: normal  Thought content:   WNL  Sensory/Perceptual disturbances:   WNL  Orientation: oriented to person, place, time/date, and situation  Attention: Good  Concentration: Good  Memory: WNL  Fund of knowledge:  Good  Insight:   Good  Judgment:  Good  Impulse Control: Good   Risk Assessment: Danger to Self:  No Self-injurious Behavior: No Danger to Others: No Duty to Warn:no Physical Aggression / Violence:No  Access to Firearms a concern: No  Gang Involvement:No   Subjective: Pt present for face-to-face individual therapy via video.  Pt consents to telehealth video session and is aware of limitations and benefits of virtual sessions. Location of pt: home Location of therapist: home office.  Pt talked about care giving for Rachel Torres.   Pt is having trouble getting her favorite care giver paid for by the [place].  This is upsetting for pt bc she wants to use that care giver full time.   Pt has been taking Rachel Torres to doctors appointments and found out Rachel Torres has to have the catheter indefinitely.  This is disappointing bc it makes care giving more difficult.  Pt talked about her family.  Pt is hosting everyone for Thanksgiving at her house.  Pt feels a little overwhelmed about the preparations.  Pt talked about her health.  She saw the eye doctor and pt will have cataract surgery February 2nd and 16th.   Pt has had some people she knew pass away recently and has a couple of funerals to attend. Worked on self care strategies. Provided supportive therapy.    Interventions: Cognitive  Behavioral Therapy and Insight-Oriented  Diagnosis:  F33.1   Plan of Care: Recommend ongoing therapy.   Pt participated in setting treatment goals.   Pt needs a place to talk about stressors.  Plan to meet every week.  Pt agrees with treatment plan.    Treatment Plan (target date: 03/27/2025) Client Abilities/Strengths  Pt is bright, engaging, and motivated for therapy.   Client Treatment Preferences  Individual therapy.  Client Statement of Needs  Improve coping skills.  Have a place to talk about stressors.   Symptoms  Depressed or irritable mood. Unresolved grief issues.  Problems Addressed  Unipolar Depression Goals 1. Alleviate depressive symptoms and return to previous level of effective functioning. 2. Appropriately grieve the loss in order to normalize mood and to return to previously adaptive level of functioning. Objective Learn and implement behavioral strategies to overcome depression. Target Date: 2025-03-27 Frequency: weekly  Progress: 45 Modality: individual  Related Interventions Engage the client in behavioral activation, increasing his/her activity level and contact with sources of reward, while identifying processes that inhibit activation.  Use behavioral techniques such as instruction, rehearsal, role-playing, role reversal, as needed, to facilitate activity in the client's daily life; reinforce success. Assist the client in developing skills that increase the likelihood of deriving pleasure from behavioral activation (e.g., assertiveness skills, developing an exercise plan, less internal/more external focus, increased social involvement); reinforce success. Objective Identify important people in life, past and present, and describe the quality, good and poor, of those relationships. Target Date: 2025-03-27 Frequency:  weekly  Progress: 45 Modality: individual  Related Interventions Conduct Interpersonal Therapy beginning with the assessment of the client's  interpersonal inventory of important past and present relationships; develop a case formulation linking depression to grief, interpersonal role disputes, role transitions, and/or interpersonal deficits). Objective Learn and implement problem-solving and decision-making skills. Target Date: 2025-03-27 Frequency: weekly  Progress: 45 Modality: individual  Related Interventions Conduct Problem-Solving Therapy using techniques such as psychoeducation, modeling, and role-playing to teach client problem-solving skills (i.e., defining a problem specifically, generating possible solutions, evaluating the pros and cons of each solution, selecting and implementing a plan of action, evaluating the efficacy of the plan, accepting or revising the plan); role-play application of the problem-solving skill to a real life issue. Encourage in the client the development of a positive problem orientation in which problems and solving them are viewed as a natural part of life and not something to be feared, despaired, or avoided. 3. Develop healthy interpersonal relationships that lead to the alleviation and help prevent the relapse of depression. 4. Develop healthy thinking patterns and beliefs about self, others, and the world that lead to the alleviation and help prevent the relapse of depression. 5. Recognize, accept, and cope with feelings of depression. Diagnosis F33.1  Conditions For Discharge Achievement of treatment goals and objectives   Rachel Alma, LCSW

## 2024-11-19 ENCOUNTER — Ambulatory Visit: Payer: Self-pay

## 2024-11-19 NOTE — Telephone Encounter (Signed)
 FYI Only or Action Required?: FYI only for provider: appointment scheduled on 11/21/2024 at 12:30 PM.  Patient was last seen in primary care on 07/09/2024 by Watt Mirza, MD.  Called Nurse Triage reporting Cough.  Symptoms began yesterday.  Interventions attempted: OTC medications: Mucinex  and Rest, hydration, or home remedies.  Symptoms are: unchanged.  Triage Disposition: See PCP When Office is Open (Within 3 Days) (overriding See Physician Within 24 Hours)  Patient/caregiver understands and will follow disposition?: Yes  Copied from CRM #[id number]. Topic: Clinical - Red Word Triage >> Nov 19, 2024 10:30 AM Charlet HERO wrote: Red Word that prompted transfer to Nurse Triage: Patient has sore throat and she is also having migranes she is out of the med for the migranes and out of xanax . Dr Randeen Reason for Disposition  [1] Known COPD or other severe lung disease (i.e., bronchiectasis, cystic fibrosis, lung surgery) AND [2] symptoms getting worse (i.e., increased sputum purulence or amount, increased breathing difficulty  Answer Assessment - Initial Assessment Questions Patient called to report moderate productive cough, congestion and sore throat. States symptoms started yesterday. Patient has been able to take OTC medications without any issues. Patient states she is needing to be evaluated for symptoms but also needs medication refills as well.   1. ONSET: When did the cough begin?      Started yesterday early morning 2. SEVERITY: How bad is the cough today?      moderate 3. SPUTUM: Describe the color of your sputum (e.g., none, dry cough; clear, white, yellow, green)    Unsure of the color 4. HEMOPTYSIS: Are you coughing up any blood? If Yes, ask: How much? (e.g., flecks, streaks, tablespoons, etc.)     no 5. DIFFICULTY BREATHING: Are you having difficulty breathing? If Yes, ask: How bad is it? (e.g., mild, moderate, severe)      Mild shortness of breath this morning  but no current shortness of breath 6. FEVER: Do you have a fever? If Yes, ask: What is your temperature, how was it measured, and when did it start?     No-but patient reports chills yesterday 7. CARDIAC HISTORY: Do you have any history of heart disease? (e.g., heart attack, congestive heart failure)      yes 8. LUNG HISTORY: Do you have any history of lung disease?  (e.g., pulmonary embolus, asthma, emphysema)     yes 9. PE RISK FACTORS: Do you have a history of blood clots? (or: recent major surgery, recent prolonged travel, bedridden)     No but is on a blood thinner due to a fib 10. OTHER SYMPTOMS: Do you have any other symptoms? (e.g., runny nose, wheezing, chest pain)       Sore throat 12. TRAVEL: Have you traveled out of the country in the last month? (e.g., travel history, exposures)       no  Protocols used: Cough - Acute Productive-A-AH

## 2024-11-19 NOTE — Telephone Encounter (Signed)
 Will see patient then Agree with ER and UC precautions

## 2024-11-20 ENCOUNTER — Ambulatory Visit: Payer: Medicare HMO | Admitting: Psychology

## 2024-11-20 DIAGNOSIS — F331 Major depressive disorder, recurrent, moderate: Secondary | ICD-10-CM

## 2024-11-20 NOTE — Progress Notes (Signed)
 [facility] Counselor/Therapist Progress Note  Patient ID: Rachel Torres, MRN: [id number],    Date: 11/20/2024  Time Spent: 10:00am-10:55pm  55 minutes   Treatment Type: Individual Therapy  Reported Symptoms: stress, overwhelm  Mental Status Exam: Appearance:  Casual     Behavior: Appropriate  Motor: Normal  Speech/Language:  Normal Rate  Affect: Appropriate  Mood: normal  Thought process: normal  Thought content:   WNL  Sensory/Perceptual disturbances:   WNL  Orientation: oriented to person, place, time/date, and situation  Attention: Good  Concentration: Good  Memory: WNL  Fund of knowledge:  Good  Insight:   Good  Judgment:  Good  Impulse Control: Good   Risk Assessment: Danger to Self:  No Self-injurious Behavior: No Danger to Others: No Duty to Warn:no Physical Aggression / Violence:No  Access to Firearms a concern: No  Gang Involvement:No   Subjective: Pt present for face-to-face individual therapy via video.  Pt consents to telehealth video session and is aware of limitations and benefits of virtual sessions. Location of pt: home Location of therapist: home office.  Pt talked about her health.  She has been sick with a virus.  She is also having migraines.  Pt sees her PCP this week.   Pt hosted Thanksgiving last week and it went well.   Pt talked about care giving for Rachel Torres.   There are times when Rachel Torres does not know who pt is and that is very upsetting for her.  Pt is also having trouble getting caregivers that she wants.  It is also difficult to get caregivers during the holidays.  Pt is worried about not having enough caregiver support.   Pt feels the stress of the holidays.   [name] sister is in the ICU with heart issues.  Addressed pt's worries about her sister.   Worked on self care strategies. Provided supportive therapy.    Interventions: Cognitive Behavioral Therapy and Insight-Oriented  Diagnosis:  F33.1   Plan of Care: Recommend  ongoing therapy.   Pt participated in setting treatment goals.   Pt needs a place to talk about stressors.  Plan to meet every week.  Pt agrees with treatment plan.    Treatment Plan (target date: 03/27/2025) Client Abilities/Strengths  Pt is bright, engaging, and motivated for therapy.   Client Treatment Preferences  Individual therapy.  Client Statement of Needs  Improve coping skills.  Have a place to talk about stressors.   Symptoms  Depressed or irritable mood. Unresolved grief issues.  Problems Addressed  Unipolar Depression Goals 1. Alleviate depressive symptoms and return to previous level of effective functioning. 2. Appropriately grieve the loss in order to normalize mood and to return to previously adaptive level of functioning. Objective Learn and implement behavioral strategies to overcome depression. Target Date: 2025-03-27 Frequency: weekly  Progress: 45 Modality: individual  Related Interventions Engage the client in behavioral activation, increasing his/her activity level and contact with sources of reward, while identifying processes that inhibit activation.  Use behavioral techniques such as instruction, rehearsal, role-playing, role reversal, as needed, to facilitate activity in the client's daily life; reinforce success. Assist the client in developing skills that increase the likelihood of deriving pleasure from behavioral activation (e.g., assertiveness skills, developing an exercise plan, less internal/more external focus, increased social involvement); reinforce success. Objective Identify important people in life, past and present, and describe the quality, good and poor, of those relationships. Target Date: 2025-03-27 Frequency: weekly  Progress: 45 Modality: individual  Related Interventions Conduct  Interpersonal Therapy beginning with the assessment of the client's interpersonal inventory of important past and present relationships; develop a case  formulation linking depression to grief, interpersonal role disputes, role transitions, and/or interpersonal deficits). Objective Learn and implement problem-solving and decision-making skills. Target Date: 2025-03-27 Frequency: weekly  Progress: 45 Modality: individual  Related Interventions Conduct Problem-Solving Therapy using techniques such as psychoeducation, modeling, and role-playing to teach client problem-solving skills (i.e., defining a problem specifically, generating possible solutions, evaluating the pros and cons of each solution, selecting and implementing a plan of action, evaluating the efficacy of the plan, accepting or revising the plan); role-play application of the problem-solving skill to a real life issue. Encourage in the client the development of a positive problem orientation in which problems and solving them are viewed as a natural part of life and not something to be feared, despaired, or avoided. 3. Develop healthy interpersonal relationships that lead to the alleviation and help prevent the relapse of depression. 4. Develop healthy thinking patterns and beliefs about self, others, and the world that lead to the alleviation and help prevent the relapse of depression. 5. Recognize, accept, and cope with feelings of depression. Diagnosis F33.1  Conditions For Discharge Achievement of treatment goals and objectives   Veva Alma, LCSW

## 2024-11-21 ENCOUNTER — Encounter: Payer: Self-pay | Admitting: Family Medicine

## 2024-11-21 ENCOUNTER — Ambulatory Visit: Admitting: Family Medicine

## 2024-11-21 VITALS — BP 126/76 | HR 85 | Temp 98.5°F | Ht 65.0 in | Wt 207.0 lb

## 2024-11-21 DIAGNOSIS — J020 Streptococcal pharyngitis: Secondary | ICD-10-CM | POA: Diagnosis not present

## 2024-11-21 DIAGNOSIS — R051 Acute cough: Secondary | ICD-10-CM | POA: Diagnosis not present

## 2024-11-21 DIAGNOSIS — J029 Acute pharyngitis, unspecified: Secondary | ICD-10-CM

## 2024-11-21 DIAGNOSIS — J069 Acute upper respiratory infection, unspecified: Secondary | ICD-10-CM

## 2024-11-21 DIAGNOSIS — G43109 Migraine with aura, not intractable, without status migrainosus: Secondary | ICD-10-CM

## 2024-11-21 LAB — POC COVID19 BINAXNOW: SARS Coronavirus 2 Ag: NEGATIVE

## 2024-11-21 LAB — POCT RAPID STREP A (OFFICE): Rapid Strep A Screen: POSITIVE — AB

## 2024-11-21 MED ORDER — AMOXICILLIN-POT CLAVULANATE 875-125 MG PO TABS: 1.0000 | ORAL_TABLET | Freq: Two times a day (BID) | ORAL | 0 refills | Status: AC

## 2024-11-21 MED ORDER — PROMETHAZINE HCL 25 MG PO TABS: 12.5000 mg | ORAL_TABLET | Freq: Three times a day (TID) | ORAL | 0 refills | Status: DC | PRN

## 2024-11-21 MED ORDER — ALPRAZOLAM 0.5 MG PO TABS: 0.5000 mg | ORAL_TABLET | Freq: Every day | ORAL | 0 refills | Status: DC | PRN

## 2024-11-21 MED ORDER — CYCLOBENZAPRINE HCL 10 MG PO TABS: 5.0000 mg | ORAL_TABLET | Freq: Three times a day (TID) | ORAL | 1 refills | Status: DC | PRN

## 2024-11-21 NOTE — Assessment & Plan Note (Signed)
 Also with ST and positive strep test Treat strep with augmentin    Reassuring exam Disc symptomatic care - see instructions on AVS  Update if not starting to improve in a week or if worsening  Call back and Er precautions noted in detail today

## 2024-11-21 NOTE — Patient Instructions (Addendum)
 Take the augmentin  as directed   Fluids/rest  Chloraseptic throat spray  mucinex  DM is good for cough and congestion  Nasal saline for congestion as needed  Tylenol  for fever or pain or headache  Please alert us  if symptoms worsen (if severe or short of breath please go to the ER)   Update if not starting to improve in a week or if worsening

## 2024-11-21 NOTE — Progress Notes (Signed)
 Subjective:    Patient ID: Rachel Torres, female    DOB: 30-Apr-1956, 68 y.o.   MRN: [id number]  HPI  Wt Readings from Last 3 Encounters:  11/21/24 207 lb (93.9 kg)  10/03/24 210 lb 8 oz (95.5 kg)  07/18/24 215 lb 9.6 oz (97.8 kg)   34.45 kg/m  Vitals:   11/21/24 1231  BP: 126/76  Pulse: 85  Temp: 98.5 F (36.9 C)  SpO2: 96%   Pt presents for c/o Uri symptoms   Cough ST Congestion  Headache   Did have cp this am (likely from cough?)  Called EMS Reassuring EKG -did not go to hospital Pulse ox was 97%  Started sat into Sunday Very sore throat  Then some cough and congestion   Some phlegm - thick and milky  No wheezing   No fever Has had chills  No body aches     Had migraine last week    Chest is no longer hurting     Rapid strep positive today Covid neg   Over the counter  Mucinex  DM   Results for orders placed or performed in visit on 11/21/24  POC COVID-19   Collection Time: 11/21/24 12:45 PM  Result Value Ref Range   SARS Coronavirus 2 Ag Negative Negative  Rapid Strep A   Collection Time: 11/21/24 12:45 PM  Result Value Ref Range   Rapid Strep A Screen Positive (A) Negative   *Note: Due to a large number of results and/or encounters for the requested time period, some results have not been displayed. A complete set of results can be found in Results Review.      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 07/18/2024   Pain in lower limb 07/06/2024   Entrapment of common peroneal nerve 07/06/2024   Typical atrial flutter (HCC) 04/04/2024   Adenomatous polyp of colon 01/19/2024   Heart failure with improved ejection fraction (HFimpEF) (HCC) 01/18/2024   Abnormal metabolism 11/14/2023   Weight regain after inital weight loss 11/14/2023   Multiple closed tarsal fractures  of left foot, sequela 07/12/2023   Arm contusion, right, sequela 07/12/2023   Lumbar disc disease 07/12/2023   Sprain of shoulder 06/30/2023   Closed fracture of fifth metatarsal bone of left foot 06/30/2023   PAF (paroxysmal atrial fibrillation) (HCC) 06/09/2023   Paresthesia 06/03/2023   Obesity, Beginning BMI 35.45 02/17/2023   Tinnitus 01/14/2023   Change in vision 01/14/2023   Encounter for screening colonoscopy 01/14/2023   Personal history of fall 01/14/2023   Problem related to unspecified psychosocial circumstances 12/28/2022   Person encountering health services to consult on behalf of another person 12/28/2022   Palpitation 12/10/2022   Fatigue 10/26/2022   B12 deficiency 10/26/2022   Right shoulder pain 09/15/2022   Depression 09/14/2022   Other constipation 05/12/2022   Medicare annual wellness visit, initial 07/17/2021   TMJ (dislocation of temporomandibular joint) 07/17/2021   Encounter for screening mammogram for breast cancer 06/05/2021   Diarrhea 04/11/2020   Epigastric pain 02/19/2020   Dark stools 02/19/2020   History of atrial fibrillation 02/05/2019   SOB (shortness of breath) 01/27/2019   Irregular surface of cornea 12/25/2018   Acute cough 09/08/2017   History of HPV infection 12/01/2016   Screening mammogram, encounter for 11/29/2016   Estrogen deficiency 11/29/2016  Encounter for routine gynecological examination 11/29/2016   Grade III hemorrhoids 10/06/2016   Tachycardia 08/14/2016   Hemorrhoids 08/13/2016   Atypical migraine 08/03/2016   CAD (coronary artery disease)    AAA (abdominal aortic aneurysm)    Chronic combined systolic (congestive) and diastolic (congestive) heart failure (HCC)    Prediabetes 02/11/2016   Generalized anxiety disorder 12/08/2015   Panic attacks 07/28/2015   Chest pain 07/09/2013   Sensorineural hearing loss 03/27/2013   Vitamin D  deficiency 07/03/2012   H/O aortic dissection 05/12/2012   Routine general medical  examination at a health care facility 09/20/2011   Atypical chest pain 09/15/2011   Viral URI with cough 08/18/2011   S/P AVR (aortic valve replacement) 01/07/2011   CORONARY ARTERY BYPASS GRAFT, HX OF 01/07/2011   Arthritis 12/20/2010   Back pain 12/15/2010   H/O dissecting abdominal aortic aneurysm repair 08/24/2010   IRRITABLE BOWEL SYNDROME 09/09/2009   Obstructive sleep apnea 08/04/2009   External hemorrhoid 06/26/2009   Osteoporosis 01/09/2009   Hyperlipidemia 07/26/2007   Depression with anxiety 07/26/2007   Essential hypertension 07/26/2007   Allergic rhinitis 07/26/2007   Asthma 07/26/2007   GERD 07/26/2007   Osteoarthritis 07/26/2007   Ocular migraine 07/26/2007   Mild intermittent asthma without complication 07/26/2007   Past Medical History:  Diagnosis Date   AAA (abdominal aortic aneurysm)    a. Chronic w/o evidence of aneurysmal dil on CTA 01/2016.   Alcohol abuse    Allergy    Anemia    Anxiety    Asthma    Atrial fibrillation (HCC)    CAD (coronary artery disease)    a. 08/2010 s/p CABG x 1 (VG->RCA) @ time of Ao dissection repair; b. 01/2016 Lexiscan  MV: mid antsept/apical defect w/ ? peri-infarct ischemia-->likely attenuation-->Med Rx.   Chewing difficulty    Chronic combined systolic (congestive) and diastolic (congestive) heart failure (HCC)    a. 2012 EF 30-35%; b. 04/2015 EF 40-45%; c. 01/2016 Echo: Ef 55-65%, nl AoV bioprosthesis, nl RV, nl PASP.   Constipation    Depression    Edema of both lower extremities    Gallbladder sludge    GERD (gastroesophageal reflux disease)    Heart failure (HCC)    Heart valve problem    History of Bicuspid Aortic Valve    a. 08/2010 s/p AVR @ time of Ao dissection repair; b. 01/2016 Echo: Ef 55-65%, nl AoV bioprosthesis, nl RV, nl PASP.   Hx of repair of dissecting thoracic aortic aneurysm, Stanford type A    a. 08/2010 s/p repair with AVR and VG->RCA; b. 01/2016 CTA: stable appearance of Asc Thoracic Aortic graft.  Opacification of flase lumen of chronic abd Ao dissection w/ retrograde flow through lumbar arteries. No aneurysmal dil of flase lumen.   Hyperlipidemia    Hypertension    Hypertensive heart disease    IBS (irritable bowel syndrome)    Internal hemorrhoids    Kidney problem    Marfan syndrome    pt denies   Migraine    Obstructive sleep apnea    Osteoarthritis    Osteoporosis    Palpitations    Pneumonia    RA (rheumatoid arthritis) (HCC)    a. Followed by Dr. Balinda   Past Surgical History:  Procedure Laterality Date   ABDOMINAL AORTIC ANEURYSM REPAIR     CARDIAC CATHETERIZATION  2001   CARDIOVERSION N/A 04/04/2024   Procedure: CARDIOVERSION;  Surgeon: Perla Evalene PARAS, MD;  Location: [facility] ORS;  Service: Cardiovascular;  Laterality: N/A;   CESAREAN SECTION  1986 & 1991   CHOLECYSTECTOMY     COLONOSCOPY     COLONOSCOPY WITH PROPOFOL  N/A 01/19/2024   Procedure: COLONOSCOPY WITH PROPOFOL ;  Surgeon: Therisa Bi, MD;  Location: [facility] ENDOSCOPY;  Service: Gastroenterology;  Laterality: N/A;   ESOPHAGOGASTRODUODENOSCOPY (EGD) WITH PROPOFOL  N/A 05/08/2020   Procedure: ESOPHAGOGASTRODUODENOSCOPY (EGD) WITH PROPOFOL ;  Surgeon: Janalyn Keene NOVAK, MD;  Location: [facility] ENDOSCOPY;  Service: Endoscopy;  Laterality: N/A;   FRACTURE SURGERY Right    shoulder replacement   HEMORRHOID BANDING     JOINT REPLACEMENT  06/2009   Right shoulder from a fall   ORIF DISTAL RADIUS FRACTURE Left    PERONEAL NERVE DECOMPRESSION Left 07/18/2024   Procedure: PERONEAL NERVE DECOMPRESSION;  Surgeon: Louis Shove, MD;  Location: [facility] OR;  Service: Neurosurgery;  Laterality: Left;  Left peroneal nerve decompression   POLYPECTOMY  01/19/2024   Procedure: POLYPECTOMY;  Surgeon: Therisa Bi, MD;  Location: [facility] ENDOSCOPY;  Service: Gastroenterology;;   UPPER GASTROINTESTINAL ENDOSCOPY     Social History   Tobacco Use   Smoking status: Former    Current packs/day: 0.00    Average packs/day: 0.8 packs/day for  1.5 years (1.1 ttl pk-yrs)    Types: Cigarettes    Start date: 06/20/1977    Quit date: 12/20/1978    Years since quitting: 45.9   Smokeless tobacco: Never  Vaping Use   Vaping status: Never Used  Substance Use Topics   Alcohol use: Yes    Comment: 2-3 glasses of red wine per month   Drug use: No   Family History  Problem Relation Age of Onset   Hypertension Mother    Depression Mother    Osteoporosis Mother    Stroke Mother    Irritable bowel syndrome Mother    Asthma Mother    Heart disease Mother    Thyroid  disease Mother    Anxiety disorder Mother    Other Father 24   Arthritis Sister    Diabetes Sister    Heart disease Sister    Asthma Sister    Migraines Sister    Diabetes Sister    Nephrolithiasis Sister    Osteoporosis Sister    Arthritis Sister    Asthma Sister    Migraines Sister    Stroke Maternal Grandmother    Colon cancer Maternal Grandmother    Lymphoma Maternal Grandmother    Migraines Maternal Grandmother    Lung cancer Maternal Grandfather    Melanoma Maternal Grandfather    Aneurysm Paternal Grandmother        brain   Diabetes Paternal Grandmother    Arthritis Paternal Grandmother    Arthritis Paternal Grandfather    Diabetes Paternal Grandfather    Diabetes Brother    Other Brother        prediabeties   Asthma Brother    Migraines Brother    Breast cancer Neg Hx    Allergies  Allergen Reactions   Drysol [Aluminum Chloride] Itching   Fosamax [Alendronate Sodium] Other (See Comments)    GI upset   Ginzing [Ginseng] Other (See Comments)    Hyperactivity Jittery   Hydrocodone  Itching    Reports that she can take with prn Benadryl    Imitrex [Sumatriptan] Other (See Comments)    Contraindicated w/ heart condition   Lortab [Hydrocodone -Acetaminophen ] Itching    Reports that she can take with prn Benadryl    Neurontin  [Gabapentin ] Other (See Comments)    Unknown reaction   Peanut (  Diagnostic) Other (See Comments)    Migraine     Peanut-Containing Drug Products Other (See Comments)    Reaction:  Migraines   Roxicodone  [Oxycodone ] Itching   Ultram  [Tramadol ] Itching   Prednisone Rash   Tape Itching   Current Outpatient Medications on File Prior to Visit  Medication Sig Dispense Refill   acetaminophen  (TYLENOL ) 500 MG tablet Take 500 mg by mouth every 6 (six) hours as needed for moderate pain (pain score 4-6), fever or headache.     albuterol  (VENTOLIN  HFA) 108 (90 Base) MCG/ACT inhaler Inhale 1-2 puffs into the lungs every 6 (six) hours as needed for wheezing or shortness of breath. 3 each 3   aspirin  EC 81 MG tablet Take 81 mg by mouth at bedtime.     Calcium  Carbonate (CALCIUM  600 PO) Take 600 mg by mouth daily.     carvedilol  (COREG ) 12.5 MG tablet Take 1 tablet (12.5 mg total) by mouth 2 (two) times daily. 180 tablet 3   cetirizine  (ZYRTEC ) 10 MG tablet Take 1 tablet (10 mg total) by mouth daily as needed for allergies. 90 tablet 3   dapagliflozin  propanediol (FARXIGA ) 10 MG TABS tablet TAKE ONE TABLET BY MOUTH ONE TIME DAILY BEFORE BREAKFAST 90 tablet 3   ELIQUIS  5 MG TABS tablet TAKE ONE TABLET BY MOUTH TWICE A DAY 180 tablet 3   ezetimibe  (ZETIA ) 10 MG tablet TAKE 1 TABLET EVERY DAY 90 tablet 3   fluticasone  (FLONASE ) 50 MCG/ACT nasal spray Place 2 sprays into both nostrils daily as needed for allergies or rhinitis. 48 g 3   furosemide  (LASIX ) 20 MG tablet TAKE 2 TABLETS EVERY DAY 180 tablet 2   HYDROXYZINE HCL PO Take 1 tablet by mouth daily as needed.     isosorbide  mononitrate (IMDUR ) 30 MG 24 hr tablet TAKE 1/2 TABLET EVERY DAY 45 tablet 3   lisinopril  (ZESTRIL ) 5 MG tablet TAKE 1/2 TABLET EVERY DAY 45 tablet 0   magnesium  gluconate (MAGONATE) 500 MG tablet Take 500 mg by mouth at bedtime.     nitroGLYCERIN  (NITROSTAT ) 0.4 MG SL tablet Place 1 tablet (0.4 mg total) under the tongue every 5 (five) minutes as needed for chest pain. 25 tablet 3   omeprazole  (PRILOSEC) 40 MG capsule TAKE 1 CAPSULE EVERY DAY 90  capsule 3   OVER THE COUNTER MEDICATION Take 1 each by mouth 2 (two) times daily. GOLI SF prebiotic/probiotic gummy     OVER THE COUNTER MEDICATION Take 1 each by mouth 2 (two) times daily. GOLI SF Ashwagandha gummies     OVER THE COUNTER MEDICATION Take 2 each by mouth 2 (two) times daily. GOLI SF Apple Cider Vinegar gummies     OVER THE COUNTER MEDICATION Take 1 each by mouth daily. Ollie prebiotic/probiotic gummy     Spacer/Aero-Holding Chambers (COMPACT SPACE CHAMBER) DEVI USE WITH INHALER 1 each 0   spironolactone  (ALDACTONE ) 25 MG tablet Take 1 tablet (25 mg total) by mouth daily. 90 tablet 3   TART CHERRY PO Take 3,000 mg by mouth 2 (two) times daily.     venlafaxine  XR (EFFEXOR -XR) 150 MG 24 hr capsule TAKE ONE CAPSULE BY MOUTH ONE TIME DAILY WITH BREAKFAST 90 capsule 3   venlafaxine  XR (EFFEXOR -XR) 37.5 MG 24 hr capsule TAKE ONE CAPSULE BY MOUTH ONE TIME DAILY WITH BREAKFAST 90 capsule 3   Vitamin D -Vitamin K (VITAMIN K2-VITAMIN D3 PO) Take 1 tablet by mouth daily.     No current facility-administered medications on file prior  to visit.    Review of Systems  Constitutional:  Positive for appetite change and fatigue. Negative for fever.  HENT:  Positive for congestion, postnasal drip, rhinorrhea, sinus pressure, sneezing, sore throat and voice change. Negative for ear pain and trouble swallowing.   Eyes:  Negative for pain and discharge.  Respiratory:  Positive for cough. Negative for shortness of breath, wheezing and stridor.   Cardiovascular:  Negative for chest pain.  Gastrointestinal:  Negative for diarrhea, nausea and vomiting.  Genitourinary:  Negative for frequency, hematuria and urgency.  Musculoskeletal:  Negative for arthralgias and myalgias.  Skin:  Negative for rash.  Neurological:  Positive for headaches. Negative for dizziness, weakness and light-headedness.  Psychiatric/Behavioral:  Negative for confusion and dysphoric mood.        Objective:   Physical  Exam Constitutional:      General: She is not in acute distress.    Appearance: Normal appearance. She is well-developed. She is obese. She is not ill-appearing, toxic-appearing or diaphoretic.  HENT:     Head: Normocephalic and atraumatic.     Comments: Nares are injected and congested    Mild frontal sinus tenderness    Right Ear: Tympanic membrane, ear canal and external ear normal.     Left Ear: Tympanic membrane, ear canal and external ear normal.     Nose: Congestion and rhinorrhea present.     Mouth/Throat:     Mouth: Mucous membranes are moist.     Pharynx: Oropharynx is clear. Posterior oropharyngeal erythema present. No oropharyngeal exudate.     Comments: Clear pnd   Mild posterior erythema  Eyes:     General:        Right eye: No discharge.        Left eye: No discharge.     Conjunctiva/sclera: Conjunctivae normal.     Pupils: Pupils are equal, round, and reactive to light.  Cardiovascular:     Rate and Rhythm: Normal rate.     Heart sounds: Normal heart sounds.  Pulmonary:     Effort: Pulmonary effort is normal. No respiratory distress.     Breath sounds: Normal breath sounds. No stridor. No wheezing, rhonchi or rales.     Comments: Good air exch Chest:     Chest wall: No tenderness.  Musculoskeletal:     Cervical back: Normal range of motion and neck supple.  Lymphadenopathy:     Cervical: No cervical adenopathy.  Skin:    General: Skin is warm and dry.     Capillary Refill: Capillary refill takes less than 2 seconds.     Findings: No rash.  Neurological:     Mental Status: She is alert.     Cranial Nerves: No cranial nerve deficit.  Psychiatric:        Mood and Affect: Mood normal.           Assessment & Plan:   Problem List Items Addressed This Visit       Cardiovascular and Mediastinum   Ocular migraine   Does not get these often anymore Uses flexeril  and phenergan  prn  Tolerates and warned of sedation  Refilled today      Relevant  Medications   cyclobenzaprine  (FLEXERIL ) 10 MG tablet     Respiratory   Viral URI with cough   Also with ST and positive strep test Treat strep with augmentin    Reassuring exam Disc symptomatic care - see instructions on AVS  Update if not starting to improve in a  week or if worsening  Call back and Er precautions noted in detail today         Strep throat - Primary   ST with positive strep test Chills but no fever General malaise  Also some congestion/cough-may be uri also   Augmentin  prescription bid 7 d Disc symptomatic care - see instructions on AVS  Call back and Er precautions noted in detail today   Update if not starting to improve in a week or if worsening          Other   Acute cough   Relevant Orders   POC COVID-19 (Completed)   Other Visit Diagnoses       Sore throat       Relevant Orders   POC COVID-19 (Completed)   Rapid Strep A (Completed)

## 2024-11-21 NOTE — Assessment & Plan Note (Signed)
 Does not get these often anymore Uses flexeril  and phenergan  prn  Tolerates and warned of sedation  Refilled today

## 2024-11-21 NOTE — Assessment & Plan Note (Signed)
 ST with positive strep test Chills but no fever General malaise  Also some congestion/cough-may be uri also   Augmentin  prescription bid 7 d Disc symptomatic care - see instructions on AVS  Call back and Er precautions noted in detail today   Update if not starting to improve in a week or if worsening

## 2024-11-27 ENCOUNTER — Ambulatory Visit: Payer: Medicare HMO | Admitting: Psychology

## 2024-11-27 DIAGNOSIS — F331 Major depressive disorder, recurrent, moderate: Secondary | ICD-10-CM

## 2024-11-27 NOTE — Progress Notes (Signed)
  Behavioral Health Counselor/Therapist Progress Note  Patient ID: JASMYNN PFALZGRAF, MRN: [id number],    Date: 11/27/2024  Time Spent: 10:00am-10:45pm  45 minutes   Treatment Type: Individual Therapy  Reported Symptoms: stress, overwhelm  Mental Status Exam: Appearance:  Casual     Behavior: Appropriate  Motor: Normal  Speech/Language:  Normal Rate  Affect: Appropriate  Mood: normal  Thought process: normal  Thought content:   WNL  Sensory/Perceptual disturbances:   WNL  Orientation: oriented to person, place, time/date, and situation  Attention: Good  Concentration: Good  Memory: WNL  Fund of knowledge:  Good  Insight:   Good  Judgment:  Good  Impulse Control: Good   Risk Assessment: Danger to Self:  No Self-injurious Behavior: No Danger to Others: No Duty to Warn:no Physical Aggression / Violence:No  Access to Firearms a concern: No  Gang Involvement:No   Subjective: Pt present for face-to-face individual therapy via video.  Pt consents to telehealth video session and is aware of limitations and benefits of virtual sessions. Location of pt: home Location of therapist: home office.  Pt talked about being sick last week.  She saw her PCP and was diagnosed with strep throat.  She was prescribed antibiotics and is feeling better now.   Pt talked about care giving for [name].   There are times when [name] does not know who pt is and that is very upsetting for her.  Pt is also having trouble getting caregivers that she wants.   Pt is worried about not having enough caregiver support.   Pt talked about [name] family.  His daughter is an addict and can be challenging to deal with.  Addressed how this impacts pt.   Pt talked about Christmas.  She will host her family Christmas Eve.  Pt is feeling overwhelmed with the preparations.  Pt is disappointed that her sons won't be there.  Worked on self care strategies. Provided supportive therapy.    Interventions: Cognitive  Behavioral Therapy and Insight-Oriented  Diagnosis:  F33.1   Plan of Care: Recommend ongoing therapy.   Pt participated in setting treatment goals.   Pt needs a place to talk about stressors.  Plan to meet every week.  Pt agrees with treatment plan.    Treatment Plan (target date: 03/27/2025) Client Abilities/Strengths  Pt is bright, engaging, and motivated for therapy.   Client Treatment Preferences  Individual therapy.  Client Statement of Needs  Improve coping skills.  Have a place to talk about stressors.   Symptoms  Depressed or irritable mood. Unresolved grief issues.  Problems Addressed  Unipolar Depression Goals 1. Alleviate depressive symptoms and return to previous level of effective functioning. 2. Appropriately grieve the loss in order to normalize mood and to return to previously adaptive level of functioning. Objective Learn and implement behavioral strategies to overcome depression. Target Date: 2025-03-27 Frequency: weekly  Progress: 45 Modality: individual  Related Interventions Engage the client in behavioral activation, increasing his/her activity level and contact with sources of reward, while identifying processes that inhibit activation.  Use behavioral techniques such as instruction, rehearsal, role-playing, role reversal, as needed, to facilitate activity in the client's daily life; reinforce success. Assist the client in developing skills that increase the likelihood of deriving pleasure from behavioral activation (e.g., assertiveness skills, developing an exercise plan, less internal/more external focus, increased social involvement); reinforce success. Objective Identify important people in life, past and present, and describe the quality, good and poor, of those relationships. Target Date: 2025-03-27  Frequency: weekly  Progress: 45 Modality: individual  Related Interventions Conduct Interpersonal Therapy beginning with the assessment of the client's  interpersonal inventory of important past and present relationships; develop a case formulation linking depression to grief, interpersonal role disputes, role transitions, and/or interpersonal deficits). Objective Learn and implement problem-solving and decision-making skills. Target Date: 2025-03-27 Frequency: weekly  Progress: 45 Modality: individual  Related Interventions Conduct Problem-Solving Therapy using techniques such as psychoeducation, modeling, and role-playing to teach client problem-solving skills (i.e., defining a problem specifically, generating possible solutions, evaluating the pros and cons of each solution, selecting and implementing a plan of action, evaluating the efficacy of the plan, accepting or revising the plan); role-play application of the problem-solving skill to a real life issue. Encourage in the client the development of a positive problem orientation in which problems and solving them are viewed as a natural part of life and not something to be feared, despaired, or avoided. 3. Develop healthy interpersonal relationships that lead to the alleviation and help prevent the relapse of depression. 4. Develop healthy thinking patterns and beliefs about self, others, and the world that lead to the alleviation and help prevent the relapse of depression. 5. Recognize, accept, and cope with feelings of depression. Diagnosis F33.1  Conditions For Discharge Achievement of treatment goals and objectives   Veva Alma, LCSW

## 2024-12-04 ENCOUNTER — Ambulatory Visit: Payer: Medicare HMO | Admitting: Psychology

## 2024-12-04 DIAGNOSIS — F331 Major depressive disorder, recurrent, moderate: Secondary | ICD-10-CM | POA: Diagnosis not present

## 2024-12-04 NOTE — Progress Notes (Signed)
 [facility] Counselor/Therapist Progress Note  Patient ID: SOLOMIYA PASCALE, MRN: [id number],    Date: 12/04/2024  Time Spent: 10:00am-10:55pm  55 minutes   Treatment Type: Individual Therapy  Reported Symptoms: stress, overwhelm  Mental Status Exam: Appearance:  Casual     Behavior: Appropriate  Motor: Normal  Speech/Language:  Normal Rate  Affect: Appropriate  Mood: normal  Thought process: normal  Thought content:   WNL  Sensory/Perceptual disturbances:   WNL  Orientation: oriented to person, place, time/date, and situation  Attention: Good  Concentration: Good  Memory: WNL  Fund of knowledge:  Good  Insight:   Good  Judgment:  Good  Impulse Control: Good   Risk Assessment: Danger to Self:  No Self-injurious Behavior: No Danger to Others: No Duty to Warn:no Physical Aggression / Violence:No  Access to Firearms a concern: No  Gang Involvement:No   Subjective: Pt present for face-to-face individual therapy via video.  Pt consents to telehealth video session and is aware of limitations and benefits of virtual sessions. Location of pt: home Location of therapist: home office.  Pt talked about having care giver issues today.  Her care giver did not show up today.  It has been a stressful morning for pt.   She was tearful as she talked about needing a break.   Pt talked about care giving for [name].   There are times when [name] does not know who pt is and that is very upsetting for her.  Pt is also having trouble getting caregivers that she wants.   Pt is worried about not having enough caregiver support.  [name] has to have surgery on Jan. 6th to have his brain stimulator replaced.   Pt talked about [name] family.  His daughter Delon is an addict and can be challenging to deal with.  Delon was at their house a few days ago and was using drugs while there.  Addressed how this impacts pt.  This was very upsetting for pt.   Pt talked about Christmas.  She  will host her family Christmas Eve.  Pt is feeling overwhelmed with the preparations.   Worked on self care strategies. Provided supportive therapy.    Interventions: Cognitive Behavioral Therapy and Insight-Oriented  Diagnosis:  F33.1   Plan of Care: Recommend ongoing therapy.   Pt participated in setting treatment goals.   Pt needs a place to talk about stressors.  Plan to meet every week.  Pt agrees with treatment plan.    Treatment Plan (target date: 03/27/2025) Client Abilities/Strengths  Pt is bright, engaging, and motivated for therapy.   Client Treatment Preferences  Individual therapy.  Client Statement of Needs  Improve coping skills.  Have a place to talk about stressors.   Symptoms  Depressed or irritable mood. Unresolved grief issues.  Problems Addressed  Unipolar Depression Goals 1. Alleviate depressive symptoms and return to previous level of effective functioning. 2. Appropriately grieve the loss in order to normalize mood and to return to previously adaptive level of functioning. Objective Learn and implement behavioral strategies to overcome depression. Target Date: 2025-03-27 Frequency: weekly  Progress: 45 Modality: individual  Related Interventions Engage the client in behavioral activation, increasing his/her activity level and contact with sources of reward, while identifying processes that inhibit activation.  Use behavioral techniques such as instruction, rehearsal, role-playing, role reversal, as needed, to facilitate activity in the client's daily life; reinforce success. Assist the client in developing skills that increase the likelihood of deriving pleasure from  behavioral activation (e.g., assertiveness skills, developing an exercise plan, less internal/more external focus, increased social involvement); reinforce success. Objective Identify important people in life, past and present, and describe the quality, good and poor, of those  relationships. Target Date: 2025-03-27 Frequency: weekly  Progress: 45 Modality: individual  Related Interventions Conduct Interpersonal Therapy beginning with the assessment of the client's interpersonal inventory of important past and present relationships; develop a case formulation linking depression to grief, interpersonal role disputes, role transitions, and/or interpersonal deficits). Objective Learn and implement problem-solving and decision-making skills. Target Date: 2025-03-27 Frequency: weekly  Progress: 45 Modality: individual  Related Interventions Conduct Problem-Solving Therapy using techniques such as psychoeducation, modeling, and role-playing to teach client problem-solving skills (i.e., defining a problem specifically, generating possible solutions, evaluating the pros and cons of each solution, selecting and implementing a plan of action, evaluating the efficacy of the plan, accepting or revising the plan); role-play application of the problem-solving skill to a real life issue. Encourage in the client the development of a positive problem orientation in which problems and solving them are viewed as a natural part of life and not something to be feared, despaired, or avoided. 3. Develop healthy interpersonal relationships that lead to the alleviation and help prevent the relapse of depression. 4. Develop healthy thinking patterns and beliefs about self, others, and the world that lead to the alleviation and help prevent the relapse of depression. 5. Recognize, accept, and cope with feelings of depression. Diagnosis F33.1  Conditions For Discharge Achievement of treatment goals and objectives   Veva Alma, LCSW

## 2024-12-09 ENCOUNTER — Other Ambulatory Visit: Payer: Self-pay | Admitting: Family Medicine

## 2024-12-11 ENCOUNTER — Ambulatory Visit: Payer: Medicare HMO | Admitting: Psychology

## 2024-12-18 ENCOUNTER — Ambulatory Visit: Payer: Medicare HMO | Admitting: Psychology

## 2024-12-27 NOTE — Progress Notes (Signed)
 " Cardiology Office Note   Date:  12/28/2024  ID:  Rachel Torres, DOB November 14, 1956, MRN [id number] PCP: Rachel Torres LABOR, MD  [facility] Providers Cardiologist:  Rachel Lunger, MD Cardiology APP:  Rachel Frederick, NP  Electrophysiologist:  Rachel Kitty, MD     History of Present Illness Rachel Torres is a 69 y.o. female with past medical history of aortic dissection type a (08/2010), aortic valve replacement (bicuspid aortic valve done at [facility]), coronary artery disease status post CABG x 1 (SVG-RCA), HFrEF with recovered ejection fraction, paroxysmal atrial fibrillation, struct of sleep apnea, rheumatoid arthritis, chronic back pain, chronic shortness of breath, hypertension, hyperlipidemia, Ehlers-Danlos syndrome, who is here today to follow-up on coronary artery disease and HFimpEF   She did several evaluations in the emergency department for chest pain.  Cardiac enzymes were negative.  EKGs were unchanged.  She underwent CT scan in 06/2022 and was transferred to [facility] given her complicated anatomy and images were evaluated by vascular surgeon it was determined there was no significant progression of her disease.  While she was at [facility] had cardiac workup with echocardiogram with PET stress was completed.  CT of the chest showed stable descending aorta repair with a type B aortic dissection with opacification of the false lumen at the level of the renal arteries via lumbar artery.  She was seen in clinic 07/08/2022 had complaints of chest discomfort and pressure that started midsternal radiating to her left jaw and up with rest and exertion without aggravating or alleviating factors.  She was scheduled for Lexiscan  stress testing and repeat echocardiogram at that time.  Echocardiogram revealed an LVEF of 45-50%, global hypokinesis, G2 DD, mild to moderate mitral regurgitation, borderline dilatation of the aortic root measuring 36 mm.  Lexiscan  MPI showed no significant ischemia, normal wall  motion, EF estimated 45%, was considered a low risk scan.  Patient was seen in the ER at [facility] on 03/09/2023 for palpitations and was found to be in atrial fibrillation.  She was started on apixaban .  She had a TEE guided cardioversion on March 03/17/23 and was symptomatic while in atrial fibrillation with fatigue.  They thought the atrial fibrillation was in the setting of an abscess.  She underwent cardioversion and maintained on apixaban .  She was evaluated in the emergency emergency department in [facility] on April 9 with palpitations and tachycardia.  She was medicated for rate control and discharged.  Carvedilol  dose was increased.  Was told [facility] will call and set up a cardioversion.  Patient states nobody called her back to arrange this.  She was interested in getting back to sinus rhythm at that time.  She was set up for cardioversion procedure that was performed on 04/04/2024.  She was shocked 1 time 150 J and converted to sinus rhythm with no immediate postoperative complications.  She was evaluated in clinic in April 2025 by EP.  At that time she was doing well.  She did continue to use Apple Watch to monitor her heart rhythm.  She continued to have abdominal fullness but her weight was decreasing at home by her home scale slowly.  They discussed the potential for A-fib ablation procedure.  She was to have updated echocardiogram and continued on carvedilol  18.75 mg twice daily, started on spironolactone  25 mg daily, and was ordered for updated labs.  She was then seen in clinic 05/09/2024 and overall was doing well from a cardiac perspective.  She has tolerated medication well and is not  noted to decrease in fluid.  She was continued on her current medication regimen.  No further testing was ordered at that time but she would need to be scheduled for updated echocardiogram as well as a CT angio of the chest/abdomen/pelvis for history of dissection of thoracic abdominal aorta that he previously been  followed by vascular if it had not been completed by July.  She was last seen in clinic 05/30/2024 by Dr. Cindie.  She was scheduled for a catheter ablation on July 24, 2024.  She was doing well.  Heart rhythm has been well-controlled recently without breakthrough A-fib noted.  She continues to take her apixaban  without bleeding issues.  She does have atrial fibrillation and flutter that she is symptomatic and associated with decompensated diastolic heart failure.  Rhythm control is indicated.  Due to the declining health of her husband for now she was to continue taking Eliquis  and her ablation would be canceled.  If she were to experience significant increase in her burden of atrial fibrillation we will revisit catheter ablation.  At she would be followed up 6 months.    She was last seen in clinic 10/03/2024 by Dr. Cindie.  She had elected to proceed with conservative management for atrial fibrillation.  So she canceled her ablation.  Continued on apixaban  for stroke prophylaxis.  She returns to clinic today stating overall for the cardiac perspective she has been doing well.  She has not noted increased amount of stress and has more fatigue as she has had a lot of stress air lately with recent hospitalizations of family member and back and forth to the [place] constantly.  She also has upcoming surgery for cataracts and toric lenses to be placed to improve eyesight.  She has been compliant with her apixaban  with no missed doses.  Denies any bleeding with no blood noted in her urine or stool.  She continues to have chronic shortness of breath but denies any chest pain.  Denies any hospitalizations or visits to the emergency department for self-harm.  ROS: 10 point review of systems has been reviewed and considered negative the exception was been listed in the HPI  Studies Reviewed EKG Interpretation Date/Time:  Friday December 28 2024 08:14:55 EST Ventricular Rate:  79 PR Interval:  188 QRS  Duration:  110 QT Interval:  406 QTC Calculation: 465 R Axis:   -18  Text Interpretation: Normal sinus rhythm Moderate voltage criteria for LVH, may be normal variant ( R in aVL , Cornell product ) Septal infarct (cited on or before 28-Dec-2024) ST & T wave abnormality, consider anterior ischemia When compared with ECG of 27-Jun-2024 10:49, No significant change since last tracing Confirmed by Rachel Torres ([id number]) on 12/28/2024 8:21:12 AM    2D echo 05/04/2024 1. Left ventricular ejection fraction, by estimation, is 50 to 55%. The  left ventricle has low normal function. The left ventricle has no regional  wall motion abnormalities. Left ventricular diastolic parameters were  normal.   2. Right ventricular systolic function is low normal. The right  ventricular size is normal.   3. Right atrial size was mildly dilated.   4. The mitral valve is normal in structure. No evidence of mitral valve  regurgitation.   5. The aortic valve was not well visualized. Aortic valve regurgitation  is not visualized.    Long term monitor, 12/24/2022   The patient was monitored for 5 days, 3 hours.   The predominant rhythm was sinus with an average rate  of 90 bpm (range 62-126 bpm in sinus).   There were rare PACs and occasional PVCs (PVC burden 1-2%).   There were at least 10 supraventricular runs, lasting up to 15 seconds with a maximum rate of 188 bpm.   There were also 19 episodes of wide-complex tachycardia labeled as nonsustained ventricular tachycardia lasting up to 15 beats with a maximum rate of 188 bpm.  Review of morphologies just that some/all of these episodes may actually represent SVT with aberrancy.   No sustained arrhythmia or prolonged pause was observed.   Patient triggered events correspond to sinus rhythm, PACs, and PVCs.   Predominantly sinus rhythm with rare PACs and occasional PVCs.  Several episodes of brief supraventricular tachycardia were noted.  There were also several episodes of  brief wide-complex tachycardia, favored to represent SVT with aberrancy over NSVT.  No evidence of atrial fibrillation.   NM myocardial w spect, 09/30/2022 Pharmacological myocardial perfusion imaging study with no significant  ischemia Normal wall motion, EF estimated at 45% (significant GI uptake artifact noted) No EKG changes concerning for ischemia at peak stress or in recovery. Low risk scan   TTE, 09/23/2022 1. Left ventricular ejection fraction, by estimation, is 45 to 50%. The left ventricle has mildly decreased function. The left ventricle demonstrates global hypokinesis. Left ventricular diastolic parameters are consistent with Grade II diastolic dysfunction (pseudonormalization).   2. Right ventricular systolic function is normal. The right ventricular size is normal.   3. The mitral valve is normal in structure. Mild to moderate mitral valve regurgitation. No evidence of mitral stenosis.   4. The aortic valve has been repaired/replaced, bioprosthetic valve. Aortic valve regurgitation is not visualized. No aortic stenosis is present. Aortic valve area, by VTI measures 2.28 cm. Aortic valve mean gradient measures 5.0 mmHg.   5. The inferior vena cava is normal in size with greater than 50% respiratory variability, suggesting right atrial pressure of 3 mmHg.   6. There is borderline dilatation of the aortic root, measuring 36 mm.   Comparison(s): 08/02/2019-EF 40-50%.   Risk Assessment/Calculations  CHA2DS2-VASc Score = 5   This indicates a 7.2% annual risk of stroke. The patient's score is based upon: CHF History: 1 HTN History: 1 Diabetes History: 0 Stroke History: 0 Vascular Disease History: 1 Age Score: 1 Gender Score: 1            Physical Exam VS:  BP 110/66 (BP Location: Left Arm, Patient Position: Sitting, Cuff Size: Normal)   Pulse 79   Ht 5' 5 (1.651 m)   Wt 208 lb (94.3 kg)   SpO2 93%   BMI 34.61 kg/m        Wt Readings from Last 3 Encounters:   12/28/24 208 lb (94.3 kg)  11/21/24 207 lb (93.9 kg)  10/03/24 210 lb 8 oz (95.5 kg)    GEN: Well nourished, well developed in no acute distress NECK: No JVD; No carotid bruits CARDIAC: RRR, II/VI systolic murmur RUSB, without rubs, gallops RESPIRATORY:  Clear to auscultation without rales, wheezing or rhonchi  ABDOMEN: Soft, non-tender, non-distended EXTREMITIES:  No edema; No deformity   ASSESSMENT AND PLAN Paroxysmal atrial fibrillation which she is maintaining sinus rhythm on EKG today and rate of 79 with LVH with no acute ischemic changes.  Previous ablation procedure was canceled unless she became symptomatic with her atrial fibrillation.  She has been continued on apixaban  5 mg twice daily for CHA2DS2-VASc of at least 5 for stroke prophylaxis as well this carvedilol   12.5 mg twice daily.  Coronary artery disease status post CABG x 1 vessel and aortic valve replacement due to bicuspid aortic valve (2011).  She denies any angina or symptoms of decompensation.  EKG revealed no ischemic changes today.  She is continued on aspirin  81 mg daily, Imdur  30 mg daily, ezetimibe  10 mg daily.  No further ischemic evaluation needed at this time.  Aortic valve replacement previously completed at [facility].  Last echocardiogram was completed 05/04/2024.  No valvular abnormalities were seen.  Will continue to monitor with yearly surveillance studies. Next study will need be after 05/05/2025.  Dissection thoracic abdominal followed by vascular surgery [place] (Dr. Oris).  History of dissecting abdominal aortic aneurysm with repair.  Her last study was done in July 2025.  She will need to continue with yearly studies.  Next study will be due in July 2026.  Chronic HFimpEF with last echocardiogram revealed an LVEF of 50-85%, low normal function, no RWMA, right ventricular systolic function, no valvular abnormalities were noted.  She is continued on carvedilol , furosemide , lisinopril , spironolactone , and Farxiga .   She continues to appear euvolemic on exam.  Continues to suffer from NYHA class I-II symptoms on occasion.  Overall has remained stable without symptoms of decompensation.  Primary hypertension with blood pressure today 110/66.  Blood pressures remain stable.  She is continued on current medication regimen.  She has also been encouraged to continue to monitor her pressures 1 to 2 hours postmedication administration at home as well.  Pure hypercholesterolemia with last LDL of 112.  She is continued on ezetimibe  10 mg daily.  Goal is 70 or less ideally 55.  Currently not interested in reinitiation of atorvastatin  or another statin medication at this time.  She does have an upcoming labs with her PCP.  Obesity with a BMI of 34.61.  Weight loss continues to be beneficial.       Dispo: Patient to return to clinic to see MD/APP in 6 months or sooner if needed for further evaluation  Signed, Donelle Baba, NP   "

## 2024-12-28 ENCOUNTER — Ambulatory Visit: Admitting: Psychology

## 2024-12-28 ENCOUNTER — Ambulatory Visit: Attending: Cardiology | Admitting: Cardiology

## 2024-12-28 ENCOUNTER — Encounter: Payer: Self-pay | Admitting: Cardiology

## 2024-12-28 VITALS — BP 110/66 | HR 79 | Ht 65.0 in | Wt 208.0 lb

## 2024-12-28 DIAGNOSIS — Z8679 Personal history of other diseases of the circulatory system: Secondary | ICD-10-CM | POA: Diagnosis not present

## 2024-12-28 DIAGNOSIS — I10 Essential (primary) hypertension: Secondary | ICD-10-CM | POA: Diagnosis not present

## 2024-12-28 DIAGNOSIS — I5020 Unspecified systolic (congestive) heart failure: Secondary | ICD-10-CM

## 2024-12-28 DIAGNOSIS — I480 Paroxysmal atrial fibrillation: Secondary | ICD-10-CM

## 2024-12-28 DIAGNOSIS — E669 Obesity, unspecified: Secondary | ICD-10-CM | POA: Diagnosis not present

## 2024-12-28 DIAGNOSIS — F331 Major depressive disorder, recurrent, moderate: Secondary | ICD-10-CM

## 2024-12-28 DIAGNOSIS — Z952 Presence of prosthetic heart valve: Secondary | ICD-10-CM | POA: Diagnosis not present

## 2024-12-28 DIAGNOSIS — I714 Abdominal aortic aneurysm, without rupture, unspecified: Secondary | ICD-10-CM

## 2024-12-28 DIAGNOSIS — I25118 Atherosclerotic heart disease of native coronary artery with other forms of angina pectoris: Secondary | ICD-10-CM

## 2024-12-28 DIAGNOSIS — E7800 Pure hypercholesterolemia, unspecified: Secondary | ICD-10-CM

## 2024-12-28 MED ORDER — CARVEDILOL 12.5 MG PO TABS: 12.5000 mg | ORAL_TABLET | Freq: Two times a day (BID) | ORAL | 3 refills | Status: AC

## 2024-12-28 NOTE — Progress Notes (Signed)
 "  [facility] Counselor/Therapist Progress Note  Patient ID: Rachel Torres, MRN: [id number],    Date: 12/28/2024  Time Spent: 10:00am-10:50pm  50 minutes   Treatment Type: Individual Therapy  Reported Symptoms: stress, overwhelm  Mental Status Exam: Appearance:  Casual     Behavior: Appropriate  Motor: Normal  Speech/Language:  Normal Rate  Affect: Appropriate  Mood: normal  Thought process: normal  Thought content:   WNL  Sensory/Perceptual disturbances:   WNL  Orientation: oriented to person, place, time/date, and situation  Attention: Good  Concentration: Good  Memory: WNL  Fund of knowledge:  Good  Insight:   Good  Judgment:  Good  Impulse Control: Good   Risk Assessment: Danger to Self:  No Self-injurious Behavior: No Danger to Others: No Duty to Warn:no Physical Aggression / Violence:No  Access to Firearms a concern: No  Gang Involvement:No   Subjective: Pt present for face-to-face individual therapy via video.  Pt consents to telehealth video session and is aware of limitations and benefits of virtual sessions. Location of pt: home Location of therapist: home office.   Pt talked about care giving for Rachel Torres.   Rachel Torres is in the hospital with a UTI.   He has been there since the beginning of this week and will probably stay in the hospital for a few more days.  There are times when Rachel Torres does not know who pt is and that is very upsetting for her.   Pt is working with Rachel Torres's OT to learn how to care for Rachel Torres given his decline and changes in his care needs.   Pt talked about the holidays.  She enjoyed spending the time with her grandson.  She hosted a lot of people which was stressful but went ok.   Pt talked about her health.  She will have cataract surgery in February and is having to work on care giving plans for Rachel Torres.  Pt is upset that she has gained some weight.  Worked on self care strategies. Provided supportive therapy.    Interventions: Cognitive  Behavioral Therapy and Insight-Oriented  Diagnosis:  F33.1   Plan of Care: Recommend ongoing therapy.   Pt participated in setting treatment goals.   Pt needs a place to talk about stressors.  Plan to meet every week.  Pt agrees with treatment plan.    Treatment Plan (target date: 03/27/2025) Client Abilities/Strengths  Pt is bright, engaging, and motivated for therapy.   Client Treatment Preferences  Individual therapy.  Client Statement of Needs  Improve coping skills.  Have a place to talk about stressors.   Symptoms  Depressed or irritable mood. Unresolved grief issues.  Problems Addressed  Unipolar Depression Goals 1. Alleviate depressive symptoms and return to previous level of effective functioning. 2. Appropriately grieve the loss in order to normalize mood and to return to previously adaptive level of functioning. Objective Learn and implement behavioral strategies to overcome depression. Target Date: 2025-03-27 Frequency: weekly  Progress: 45 Modality: individual  Related Interventions Engage the client in behavioral activation, increasing his/her activity level and contact with sources of reward, while identifying processes that inhibit activation.  Use behavioral techniques such as instruction, rehearsal, role-playing, role reversal, as needed, to facilitate activity in the client's daily life; reinforce success. Assist the client in developing skills that increase the likelihood of deriving pleasure from behavioral activation (e.g., assertiveness skills, developing an exercise plan, less internal/more external focus, increased social involvement); reinforce success. Objective Identify important people in life, past  and present, and describe the quality, good and poor, of those relationships. Target Date: 2025-03-27 Frequency: weekly  Progress: 45 Modality: individual  Related Interventions Conduct Interpersonal Therapy beginning with the assessment of the client's  interpersonal inventory of important past and present relationships; develop a case formulation linking depression to grief, interpersonal role disputes, role transitions, and/or interpersonal deficits). Objective Learn and implement problem-solving and decision-making skills. Target Date: 2025-03-27 Frequency: weekly  Progress: 45 Modality: individual  Related Interventions Conduct Problem-Solving Therapy using techniques such as psychoeducation, modeling, and role-playing to teach client problem-solving skills (i.e., defining a problem specifically, generating possible solutions, evaluating the pros and cons of each solution, selecting and implementing a plan of action, evaluating the efficacy of the plan, accepting or revising the plan); role-play application of the problem-solving skill to a real life issue. Encourage in the client the development of a positive problem orientation in which problems and solving them are viewed as a natural part of life and not something to be feared, despaired, or avoided. 3. Develop healthy interpersonal relationships that lead to the alleviation and help prevent the relapse of depression. 4. Develop healthy thinking patterns and beliefs about self, others, and the world that lead to the alleviation and help prevent the relapse of depression. 5. Recognize, accept, and cope with feelings of depression. Diagnosis F33.1  Conditions For Discharge Achievement of treatment goals and objectives   Rachel Alma, LCSW    "

## 2024-12-28 NOTE — Patient Instructions (Signed)
 Medication Instructions:  Your physician recommends that you continue on your current medications as directed. Please refer to the Current Medication list given to you today.   *If you need a refill on your cardiac medications before your next appointment, please call your pharmacy*  Lab Work: No labs ordered today  If you have labs (blood work) drawn today and your tests are completely normal, you will receive your results only by: MyChart Message (if you have MyChart) OR A paper copy in the mail If you have any lab test that is abnormal or we need to change your treatment, we will call you to review the results.  Testing/Procedures: No test ordered today   Follow-Up: At [facility], you and your health needs are our priority.  As part of our continuing mission to provide you with exceptional heart care, our providers are all part of one team.  This team includes your primary Cardiologist (physician) and Advanced Practice Providers or APPs (Physician Assistants and Nurse Practitioners) who all work together to provide you with the care you need, when you need it.  Your next appointment:   6 month(s)  Provider:   You may see Timothy Gollan, MD or one of the following Advanced Practice Providers on your designated Care Team:   Tylene Lunch, NP

## 2024-12-31 ENCOUNTER — Telehealth: Payer: Self-pay | Admitting: Family Medicine

## 2024-12-31 ENCOUNTER — Ambulatory Visit: Payer: Medicare HMO

## 2024-12-31 ENCOUNTER — Telehealth: Payer: Self-pay

## 2024-12-31 VITALS — Ht 65.0 in | Wt 208.0 lb

## 2024-12-31 DIAGNOSIS — Z1231 Encounter for screening mammogram for malignant neoplasm of breast: Secondary | ICD-10-CM

## 2024-12-31 DIAGNOSIS — I10 Essential (primary) hypertension: Secondary | ICD-10-CM

## 2024-12-31 DIAGNOSIS — R7303 Prediabetes: Secondary | ICD-10-CM

## 2024-12-31 DIAGNOSIS — Z0000 Encounter for general adult medical examination without abnormal findings: Secondary | ICD-10-CM

## 2024-12-31 DIAGNOSIS — E559 Vitamin D deficiency, unspecified: Secondary | ICD-10-CM

## 2024-12-31 DIAGNOSIS — M810 Age-related osteoporosis without current pathological fracture: Secondary | ICD-10-CM

## 2024-12-31 DIAGNOSIS — E782 Mixed hyperlipidemia: Secondary | ICD-10-CM

## 2024-12-31 DIAGNOSIS — E538 Deficiency of other specified B group vitamins: Secondary | ICD-10-CM

## 2024-12-31 NOTE — Telephone Encounter (Signed)
 I called and pt has been scheduled for fasting labs this Thursday.

## 2024-12-31 NOTE — Telephone Encounter (Signed)
-----   Message from Veva JINNY Ferrari sent at 12/31/2024  9:54 AM EST ----- Regarding: Lab orders for [facility], 1.15.26 Patient is scheduled for CPX labs, please order future labs, Thanks , Veva

## 2024-12-31 NOTE — Progress Notes (Signed)
 "  Chief Complaint  Patient presents with   Medicare Wellness     Subjective:   Rachel Torres is a 69 y.o. female who presents for a [employer] Visit.  Visit info / Clinical Intake: Medicare Wellness Visit Type:: Subsequent Annual Wellness Visit Persons participating in visit and providing information:: patient Medicare Wellness Visit Mode:: Video Since this visit was completed virtually, some vitals may be partially provided or unavailable. Missing vitals are due to the limitations of the virtual format.: Documented vitals are patient reported If Telephone or Video please confirm:: I connected with patient using audio/video enable telemedicine. I verified patient identity with two identifiers, discussed telehealth limitations, and patient agreed to proceed. Patient Location:: Home Provider Location:: Office Interpreter Needed?: No Pre-visit prep was completed: yes AWV questionnaire completed by patient prior to visit?: no Living arrangements:: lives with spouse/significant other Patient's Overall Health Status Rating: good Typical amount of pain: none Does pain affect daily life?: no Are you currently prescribed opioids?: no  Dietary Habits and Nutritional Risks How many meals a day?: 2 (2-3) Eats fruit and vegetables daily?: yes Most meals are obtained by: preparing own meals; eating out In the last 2 weeks, have you had any of the following?: none Diabetic:: no  Functional Status Activities of Daily Living (to include ambulation/medication): Independent Ambulation: Independent with device- listed below Home Assistive Devices/Equipment: Dentures (specify type); Eyeglasses; Nebulizers Medication Administration: Independent Home Management (perform basic housework or laundry): Independent Manage your own finances?: yes Primary transportation is: driving Concerns about vision?: no *vision screening is required for WTM* Concerns about hearing?: no  Fall  Screening Falls in the past year?: 1 Number of falls in past year: 0 (2) Was there an injury with Fall?: 0 Fall Risk Category Calculator: 1 Patient Fall Risk Level: Low Fall Risk  Fall Risk Patient at Risk for Falls Due to: Impaired balance/gait Fall risk Follow up: Falls evaluation completed; Falls prevention discussed  Home and Transportation Safety: All rugs have non-skid backing?: N/A, no rugs All stairs or steps have railings?: N/A, no stairs Grab bars in the bathtub or shower?: yes Have non-skid surface in bathtub or shower?: yes Good home lighting?: yes Regular seat belt use?: yes Hospital stays in the last year:: no  Cognitive Assessment Difficulty concentrating, remembering, or making decisions? : no Will 6CIT or Mini Cog be Completed: yes What year is it?: 0 points What month is it?: 0 points Give patient an address phrase to remember (5 components): [address] [place], [place] About what time is it?: 0 points Count backwards from 20 to 1: 0 points Say the months of the year in reverse: 0 points Repeat the address phrase from earlier: 2 points (Drive) 6 CIT Score: 2 points  Advance Directives (For Healthcare) Does Patient Have a Medical Advance Directive?: Yes Does patient want to make changes to medical advance directive?: No - Patient declined Type of Advance Directive: Healthcare Power of [place]; Living will Copy of Healthcare Power of Attorney in Chart?: No - copy requested Copy of Living Will in Chart?: No - copy requested  Reviewed/Updated  Reviewed/Updated: Reviewed All (Medical, Surgical, Family, Medications, Allergies, Care Teams, Patient Goals)    Allergies (verified) Drysol [aluminum chloride], Fosamax [alendronate sodium], Ginzing [ginseng], Hydrocodone , Imitrex [sumatriptan], Lortab [hydrocodone -acetaminophen ], Neurontin  [gabapentin ], Peanut (diagnostic), Peanut-containing drug products, Roxicodone  [oxycodone ], Ultram  [tramadol ], Prednisone, and  Tape   Current Medications (verified) Outpatient Encounter Medications as of 12/31/2024  Medication Sig   acetaminophen  (TYLENOL ) 500 MG  tablet Take 500 mg by mouth every 6 (six) hours as needed for moderate pain (pain score 4-6), fever or headache.   albuterol  (VENTOLIN  HFA) 108 (90 Base) MCG/ACT inhaler Inhale 1-2 puffs into the lungs every 6 (six) hours as needed for wheezing or shortness of breath.   ALPRAZolam  (XANAX ) 0.5 MG tablet Take 1 tablet (0.5 mg total) by mouth daily as needed for anxiety.   amoxicillin -clavulanate (AUGMENTIN ) 875-125 MG tablet Take 1 tablet by mouth 2 (two) times daily. (Patient taking differently: Take 1 tablet by mouth 2 (two) times daily. For dental work)   aspirin  EC 81 MG tablet Take 81 mg by mouth at bedtime.   Calcium  Carbonate (CALCIUM  600 PO) Take 600 mg by mouth daily.   carvedilol  (COREG ) 12.5 MG tablet Take 1 tablet (12.5 mg total) by mouth 2 (two) times daily.   cetirizine  (ZYRTEC ) 10 MG tablet Take 1 tablet (10 mg total) by mouth daily as needed for allergies.   cyclobenzaprine  (FLEXERIL ) 10 MG tablet Take 0.5-1 tablets (5-10 mg total) by mouth 3 (three) times daily as needed. For headache   dapagliflozin  propanediol (FARXIGA ) 10 MG TABS tablet TAKE ONE TABLET BY MOUTH ONE TIME DAILY BEFORE BREAKFAST   ELIQUIS  5 MG TABS tablet TAKE ONE TABLET BY MOUTH TWICE A DAY   ezetimibe  (ZETIA ) 10 MG tablet TAKE 1 TABLET EVERY DAY   fluticasone  (FLONASE ) 50 MCG/ACT nasal spray Place 2 sprays into both nostrils daily as needed for allergies or rhinitis.   furosemide  (LASIX ) 20 MG tablet TAKE 2 TABLETS EVERY DAY   HYDROXYZINE HCL PO Take 1 tablet by mouth daily as needed.   isosorbide  mononitrate (IMDUR ) 30 MG 24 hr tablet TAKE 1/2 TABLET EVERY DAY   lisinopril  (ZESTRIL ) 5 MG tablet TAKE 1/2 TABLET EVERY DAY   magnesium  gluconate (MAGONATE) 500 MG tablet Take 500 mg by mouth at bedtime.   nitroGLYCERIN  (NITROSTAT ) 0.4 MG SL tablet Place 1 tablet (0.4 mg total)  under the tongue every 5 (five) minutes as needed for chest pain.   omeprazole  (PRILOSEC) 40 MG capsule TAKE 1 CAPSULE EVERY DAY   OVER THE COUNTER MEDICATION Take 1 each by mouth 2 (two) times daily. GOLI SF prebiotic/probiotic gummy   OVER THE COUNTER MEDICATION Take 1 each by mouth 2 (two) times daily. GOLI SF Ashwagandha gummies   OVER THE COUNTER MEDICATION Take 2 each by mouth 2 (two) times daily. GOLI SF Apple Cider Vinegar gummies   OVER THE COUNTER MEDICATION Take 1 each by mouth daily. Ollie prebiotic/probiotic gummy   promethazine  (PHENERGAN ) 25 MG tablet Take 0.5 tablets (12.5 mg total) by mouth every 8 (eight) hours as needed for nausea or vomiting (with migraine).   Spacer/Aero-Holding Chambers (COMPACT SPACE CHAMBER) DEVI USE WITH INHALER   spironolactone  (ALDACTONE ) 25 MG tablet Take 1 tablet (25 mg total) by mouth daily.   TART CHERRY PO Take 3,000 mg by mouth 2 (two) times daily.   venlafaxine  XR (EFFEXOR -XR) 150 MG 24 hr capsule TAKE ONE CAPSULE BY MOUTH ONE TIME DAILY WITH BREAKFAST   venlafaxine  XR (EFFEXOR -XR) 37.5 MG 24 hr capsule TAKE ONE CAPSULE BY MOUTH ONE TIME DAILY WITH BREAKFAST   Vitamin D -Vitamin K (VITAMIN K2-VITAMIN D3 PO) Take 1 tablet by mouth daily.   No facility-administered encounter medications on file as of 12/31/2024.    History: Past Medical History:  Diagnosis Date   AAA (abdominal aortic aneurysm)    a. Chronic w/o evidence of aneurysmal dil on CTA 01/2016.  Alcohol abuse    Allergy    Anemia    Anxiety    Asthma    Atrial fibrillation (HCC)    CAD (coronary artery disease)    a. 08/2010 s/p CABG x 1 (VG->RCA) @ time of Ao dissection repair; b. 01/2016 Lexiscan  MV: mid antsept/apical defect w/ ? peri-infarct ischemia-->likely attenuation-->Med Rx.   Chewing difficulty    Chronic combined systolic (congestive) and diastolic (congestive) heart failure (HCC)    a. 2012 EF 30-35%; b. 04/2015 EF 40-45%; c. 01/2016 Echo: Ef 55-65%, nl AoV  bioprosthesis, nl RV, nl PASP.   Constipation    Depression    Edema of both lower extremities    Gallbladder sludge    GERD (gastroesophageal reflux disease)    Heart failure (HCC)    Heart valve problem    History of Bicuspid Aortic Valve    a. 08/2010 s/p AVR @ time of Ao dissection repair; b. 01/2016 Echo: Ef 55-65%, nl AoV bioprosthesis, nl RV, nl PASP.   Hx of repair of dissecting thoracic aortic aneurysm, Stanford type A    a. 08/2010 s/p repair with AVR and VG->RCA; b. 01/2016 CTA: stable appearance of Asc Thoracic Aortic graft. Opacification of flase lumen of chronic abd Ao dissection w/ retrograde flow through lumbar arteries. No aneurysmal dil of flase lumen.   Hyperlipidemia    Hypertension    Hypertensive heart disease    IBS (irritable bowel syndrome)    Internal hemorrhoids    Kidney problem    Marfan syndrome    pt denies   Migraine    Obstructive sleep apnea    Osteoarthritis    Osteoporosis    Palpitations    Pneumonia    RA (rheumatoid arthritis) (HCC)    a. Followed by Dr. Balinda   Past Surgical History:  Procedure Laterality Date   ABDOMINAL AORTIC ANEURYSM REPAIR     CARDIAC CATHETERIZATION  2001   CARDIOVERSION N/A 04/04/2024   Procedure: CARDIOVERSION;  Surgeon: Perla Evalene PARAS, MD;  Location: [facility] ORS;  Service: Cardiovascular;  Laterality: N/A;   CESAREAN SECTION  1986 & 1991   CHOLECYSTECTOMY     COLONOSCOPY     COLONOSCOPY WITH PROPOFOL  N/A 01/19/2024   Procedure: COLONOSCOPY WITH PROPOFOL ;  Surgeon: Therisa Bi, MD;  Location: [facility] ENDOSCOPY;  Service: Gastroenterology;  Laterality: N/A;   ESOPHAGOGASTRODUODENOSCOPY (EGD) WITH PROPOFOL  N/A 05/08/2020   Procedure: ESOPHAGOGASTRODUODENOSCOPY (EGD) WITH PROPOFOL ;  Surgeon: Janalyn Keene NOVAK, MD;  Location: [facility] ENDOSCOPY;  Service: Endoscopy;  Laterality: N/A;   FRACTURE SURGERY Right    shoulder replacement   HEMORRHOID BANDING     JOINT REPLACEMENT  06/2009   Right shoulder from a fall    ORIF DISTAL RADIUS FRACTURE Left    PERONEAL NERVE DECOMPRESSION Left 07/18/2024   Procedure: PERONEAL NERVE DECOMPRESSION;  Surgeon: Louis Shove, MD;  Location: [facility] OR;  Service: Neurosurgery;  Laterality: Left;  Left peroneal nerve decompression   POLYPECTOMY  01/19/2024   Procedure: POLYPECTOMY;  Surgeon: Therisa Bi, MD;  Location: [facility] ENDOSCOPY;  Service: Gastroenterology;;   UPPER GASTROINTESTINAL ENDOSCOPY     Family History  Problem Relation Age of Onset   Hypertension Mother    Depression Mother    Osteoporosis Mother    Stroke Mother    Irritable bowel syndrome Mother    Asthma Mother    Heart disease Mother    Thyroid  disease Mother    Anxiety disorder Mother    Other Father 73   Arthritis  Sister    Diabetes Sister    Heart disease Sister    Asthma Sister    Migraines Sister    Diabetes Sister    Nephrolithiasis Sister    Osteoporosis Sister    Arthritis Sister    Asthma Sister    Migraines Sister    Stroke Maternal Grandmother    Colon cancer Maternal Grandmother    Lymphoma Maternal Grandmother    Migraines Maternal Grandmother    Lung cancer Maternal Grandfather    Melanoma Maternal Grandfather    Aneurysm Paternal Grandmother        brain   Diabetes Paternal Grandmother    Arthritis Paternal Grandmother    Arthritis Paternal Grandfather    Diabetes Paternal Grandfather    Diabetes Brother    Other Brother        prediabeties   Asthma Brother    Migraines Brother    Breast cancer Neg Hx    Social History   Occupational History   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    Employer: [employer]  Tobacco Use   Smoking status: Former    Current packs/day: 0.00    Average packs/day: 0.8 packs/day for 1.5 years (1.1 ttl pk-yrs)    Types: Cigarettes    Start date: 06/20/1977    Quit date: 12/20/1978    Years since quitting: 46.0   Smokeless tobacco: Never  Vaping Use   Vaping status: Never Used  Substance and Sexual Activity   Alcohol use: Yes    Alcohol/week:  1.0 standard drink of alcohol    Types: 1 Glasses of wine per week    Comment: 2-3 glasses of red wine per month   Drug use: No   Sexual activity: Not Currently   Tobacco Counseling Counseling given: Yes  SDOH Screenings   Food Insecurity: No Food Insecurity (12/31/2024)  Housing: Unknown (12/31/2024)  Transportation Needs: No Transportation Needs (12/31/2024)  Utilities: Not At Risk (12/31/2024)  Alcohol Screen: Low Risk (12/29/2023)  Depression (PHQ2-9): Low Risk (12/31/2024)  Recent Concern: Depression (PHQ2-9) - Medium Risk (11/21/2024)  Financial Resource Strain: Low Risk  (03/27/2024)   Received [facility]  Physical Activity: Inactive (12/31/2024)  Social Connections: Moderately Integrated (12/31/2024)  Stress: Stress Concern Present (12/31/2024)  Tobacco Use: Medium Risk (12/31/2024)  Health Literacy: Adequate Health Literacy (12/31/2024)   See flowsheets for full screening details  Depression Screen PHQ 2 & 9 Depression Scale- Over the past 2 weeks, how often have you been bothered by any of the following problems? Little interest or pleasure in doing things: 0 Feeling down, depressed, or hopeless (PHQ Adolescent also includes...irritable): 3 PHQ-2 Total Score: 3 Trouble falling or staying asleep, or sleeping too much: 1 (gets about 5-6hrs of sleep) Feeling tired or having little energy: 0 Poor appetite or overeating (PHQ Adolescent also includes...weight loss): 0 Feeling bad about yourself - or that you are a failure or have let yourself or your family down: 0 Trouble concentrating on things, such as reading the newspaper or watching television (PHQ Adolescent also includes...like school work): 0 Moving or speaking so slowly that other people could have noticed. Or the opposite - being so fidgety or restless that you have been moving around a lot more than usual: 0 Thoughts that you would be better off dead, or of hurting yourself in some way: 0 PHQ-9 Total  Score: 4 If you checked off any problems, how difficult have these problems made it for you to do your work, take care  of things at home, or get along with other people?: Not difficult at all  Depression Treatment Depression Interventions/Treatment : Medication; Currently on Treatment; PHQ2-9 Score <4 Follow-up Not Indicated     Goals Addressed               This Visit's Progress     Patient Stated (pt-stated)        Patient stated she plans to continue managing health - also helping sick Spouse             Objective:    Today's Vitals   12/31/24 0816  Weight: 208 lb (94.3 kg)  Height: 5' 5 (1.651 m)   Body mass index is 34.61 kg/m.  Hearing/Vision screen Hearing Screening - Comments:: Denies hearing difficulties   Vision Screening - Comments:: Wears rx glasses - up to date with routine eye exams with [facility]  Immunizations and Health Maintenance Health Maintenance  Topic Date Due   Zoster Vaccines- Shingrix (1 of 2) Never done   Mammogram  08/17/2024   COVID-19 Vaccine (5 - 2025-26 season) 08/20/2024   Influenza Vaccine  03/19/2025 (Originally 07/20/2024)   Medicare Annual Wellness (AWV)  12/31/2025   DTaP/Tdap/Td (3 - Tdap) 04/02/2029   Colonoscopy  01/18/2034   Pneumococcal Vaccine: 50+ Years  Completed   Bone Density Scan  Completed   Hepatitis C Screening  Completed   Meningococcal B Vaccine  Aged Out        Assessment/Plan:  This is a routine wellness examination for Sayge.  Mammogram status: ordered today  Patient Care Team: Tower, Laine LABOR, MD as PCP - General (Family Medicine) Perla, Evalene PARAS, MD as PCP - Cardiology (Cardiology) Kennyth Chew, MD as PCP - Electrophysiology (Cardiology) Verdon Louann BIRCH, MD as Consulting Physician (Family Medicine) Gerard Frederick, NP as Nurse Practitioner (Cardiology) Louis Shove, MD as Consulting Physician (Neurosurgery)  I have personally reviewed and noted the following in the patients chart:    Medical and social history Use of alcohol, tobacco or illicit drugs  Current medications and supplements including opioid prescriptions. Functional ability and status Nutritional status Physical activity Advanced directives List of other physicians Hospitalizations, surgeries, and ER visits in previous 12 months Vitals Screenings to include cognitive, depression, and falls Referrals and appointments  Orders Placed This Encounter  Procedures   MM 3D SCREENING MAMMOGRAM BILATERAL BREAST    Standing Status:   Future    Expiration Date:   12/31/2025    Reason for Exam (SYMPTOM  OR DIAGNOSIS REQUIRED):   screening for breast cancer    Preferred imaging location?:   [facility]   In addition, I have reviewed and discussed with patient certain preventive protocols, quality metrics, and best practice recommendations. A written personalized care plan for preventive services as well as general preventive health recommendations were provided to patient.   Verdie CHRISTELLA Saba, CMA   12/31/2024   Return in 1 year (on 12/31/2025).  After Visit Summary: (MyChart) Due to this being a telephonic visit, the after visit summary with patients personalized plan was offered to patient via MyChart   Nurse Notes: schedueled 2027 AWV appt. "

## 2024-12-31 NOTE — Patient Instructions (Addendum)
 Ms. Baumert,  Thank you for taking the time for your Medicare Wellness Visit. I appreciate your continued commitment to your health goals. Please review the care plan we discussed, and feel free to reach out if I can assist you further.  Please note that Annual Wellness Visits do not include a physical exam. Some assessments may be limited, especially if the visit was conducted virtually. If needed, we may recommend an in-person follow-up with your provider.  Ongoing Care Seeing your primary care provider every 3 to 6 months helps us  monitor your health and provide consistent, personalized care.   Referrals If a referral was made during today's visit and you haven't received any updates within two weeks, please contact the referred provider directly to check on the status.  Recommended Screenings:  Health Maintenance  Topic Date Due   Zoster (Shingles) Vaccine (1 of 2) Never done   Breast Cancer Screening  08/17/2024   COVID-19 Vaccine (5 - 2025-26 season) 08/20/2024   Flu Shot  03/19/2025*   Medicare Annual Wellness Visit  12/31/2025   DTaP/Tdap/Td vaccine (3 - Tdap) 04/02/2029   Colon Cancer Screening  01/18/2034   Pneumococcal Vaccine for age over 40  Completed   Osteoporosis screening with Bone Density Scan  Completed   Hepatitis C Screening  Completed   Meningitis B Vaccine  Aged Out  *Topic was postponed. The date shown is not the original due date.       12/31/2024    8:18 AM  Advanced Directives  Does Patient Have a Medical Advance Directive? Yes    Vision: Annual vision screenings are recommended for early detection of glaucoma, cataracts, and diabetic retinopathy. These exams can also reveal signs of chronic conditions such as diabetes and high blood pressure.  Dental: Annual dental screenings help detect early signs of oral cancer, gum disease, and other conditions linked to overall health, including heart disease and diabetes.

## 2024-12-31 NOTE — Telephone Encounter (Signed)
 would like for the nurse to call - requesting fasting lab appt prior to CPE next week

## 2024-12-31 NOTE — Telephone Encounter (Signed)
 Please scheduled fasting labs this week since CPE is on 01/07/25, PCP will place orders closer to appt date

## 2025-01-03 ENCOUNTER — Other Ambulatory Visit: Payer: Self-pay | Admitting: Cardiology

## 2025-01-03 ENCOUNTER — Other Ambulatory Visit: Payer: Self-pay | Admitting: Family Medicine

## 2025-01-03 ENCOUNTER — Ambulatory Visit: Payer: Self-pay | Admitting: Family Medicine

## 2025-01-03 ENCOUNTER — Other Ambulatory Visit (INDEPENDENT_AMBULATORY_CARE_PROVIDER_SITE_OTHER)

## 2025-01-03 DIAGNOSIS — E559 Vitamin D deficiency, unspecified: Secondary | ICD-10-CM

## 2025-01-03 DIAGNOSIS — I10 Essential (primary) hypertension: Secondary | ICD-10-CM

## 2025-01-03 DIAGNOSIS — E782 Mixed hyperlipidemia: Secondary | ICD-10-CM | POA: Diagnosis not present

## 2025-01-03 DIAGNOSIS — E538 Deficiency of other specified B group vitamins: Secondary | ICD-10-CM | POA: Diagnosis not present

## 2025-01-03 DIAGNOSIS — R7303 Prediabetes: Secondary | ICD-10-CM | POA: Diagnosis not present

## 2025-01-03 DIAGNOSIS — F418 Other specified anxiety disorders: Secondary | ICD-10-CM

## 2025-01-03 LAB — COMPREHENSIVE METABOLIC PANEL WITH GFR
ALT: 19 U/L (ref 3–35)
AST: 17 U/L (ref 5–37)
Albumin: 4.4 g/dL (ref 3.5–5.2)
Alkaline Phosphatase: 64 U/L (ref 39–117)
BUN: 16 mg/dL (ref 6–23)
CO2: 30 meq/L (ref 19–32)
Calcium: 9 mg/dL (ref 8.4–10.5)
Chloride: 104 meq/L (ref 96–112)
Creatinine, Ser: 0.86 mg/dL (ref 0.40–1.20)
GFR: 70.01 mL/min
Glucose, Bld: 108 mg/dL — ABNORMAL HIGH (ref 70–99)
Potassium: 4.2 meq/L (ref 3.5–5.1)
Sodium: 138 meq/L (ref 135–145)
Total Bilirubin: 0.5 mg/dL (ref 0.2–1.2)
Total Protein: 7.1 g/dL (ref 6.0–8.3)

## 2025-01-03 LAB — CBC WITH DIFFERENTIAL/PLATELET
Basophils Absolute: 0 K/uL (ref 0.0–0.1)
Basophils Relative: 0.4 % (ref 0.0–3.0)
Eosinophils Absolute: 0.1 K/uL (ref 0.0–0.7)
Eosinophils Relative: 2.3 % (ref 0.0–5.0)
HCT: 41.6 % (ref 36.0–46.0)
Hemoglobin: 14.3 g/dL (ref 12.0–15.0)
Lymphocytes Relative: 28.5 % (ref 12.0–46.0)
Lymphs Abs: 1.3 K/uL (ref 0.7–4.0)
MCHC: 34.5 g/dL (ref 30.0–36.0)
MCV: 94.5 fl (ref 78.0–100.0)
Monocytes Absolute: 0.4 K/uL (ref 0.1–1.0)
Monocytes Relative: 9.6 % (ref 3.0–12.0)
Neutro Abs: 2.7 K/uL (ref 1.4–7.7)
Neutrophils Relative %: 59.2 % (ref 43.0–77.0)
Platelets: 229 K/uL (ref 150.0–400.0)
RBC: 4.4 Mil/uL (ref 3.87–5.11)
RDW: 14 % (ref 11.5–15.5)
WBC: 4.6 K/uL (ref 4.0–10.5)

## 2025-01-03 LAB — LIPID PANEL
Cholesterol: 197 mg/dL (ref 28–200)
HDL: 54.6 mg/dL
LDL Cholesterol: 116 mg/dL — ABNORMAL HIGH (ref 10–99)
NonHDL: 142.61
Total CHOL/HDL Ratio: 4
Triglycerides: 131 mg/dL (ref 10.0–149.0)
VLDL: 26.2 mg/dL (ref 0.0–40.0)

## 2025-01-03 LAB — HEMOGLOBIN A1C: Hgb A1c MFr Bld: 6.2 % (ref 4.6–6.5)

## 2025-01-03 LAB — TSH: TSH: 0.7 u[IU]/mL (ref 0.35–5.50)

## 2025-01-03 LAB — VITAMIN D 25 HYDROXY (VIT D DEFICIENCY, FRACTURES): VITD: 51.85 ng/mL (ref 30.00–100.00)

## 2025-01-03 LAB — VITAMIN B12: Vitamin B-12: 678 pg/mL (ref 211–911)

## 2025-01-07 ENCOUNTER — Encounter: Admitting: Family Medicine

## 2025-01-10 ENCOUNTER — Other Ambulatory Visit: Payer: Self-pay | Admitting: Family Medicine

## 2025-01-10 ENCOUNTER — Ambulatory Visit: Admitting: Psychology

## 2025-01-10 DIAGNOSIS — F331 Major depressive disorder, recurrent, moderate: Secondary | ICD-10-CM

## 2025-01-10 DIAGNOSIS — F418 Other specified anxiety disorders: Secondary | ICD-10-CM

## 2025-01-10 NOTE — Progress Notes (Signed)
 "  [facility] Counselor/Therapist Progress Note  Patient ID: SINCLAIR ARRAZOLA, MRN: [id number],    Date: 01/10/2025  Time Spent: 3:00pm - 3:55pm   55 minutes   Treatment Type: Individual Therapy  Reported Symptoms: stress, overwhelm  Mental Status Exam: Appearance:  Casual     Behavior: Appropriate  Motor: Normal  Speech/Language:  Normal Rate  Affect: Appropriate  Mood: normal  Thought process: normal  Thought content:   WNL  Sensory/Perceptual disturbances:   WNL  Orientation: oriented to person, place, time/date, and situation  Attention: Good  Concentration: Good  Memory: WNL  Fund of knowledge:  Good  Insight:   Good  Judgment:  Good  Impulse Control: Good   Risk Assessment: Danger to Self:  No Self-injurious Behavior: No Danger to Others: No Duty to Warn:no Physical Aggression / Violence:No  Access to Firearms a concern: No  Gang Involvement:No   Subjective: Pt present for face-to-face individual therapy via video.  Pt consents to telehealth video session and is aware of limitations and benefits of virtual sessions. Location of pt: home Location of therapist: home office.   Pt talked about care giving for [name].   [name] was sick this past weekend so she could not have a care giver there.  This was stressful for pt.  There are times when [name] does not know who pt is and that is very upsetting for her.   Pt is working on not losing her patience with [name],   Worked on coping strategies.    Pt talked about [name] daughter who got caught for stealing.   Pt and [name] are upset about this.   Pt talked about her health.  She had to change her eye surgery appointments for cataract surgery.  The surgeries will be delayed until March/April.   Worked on self care strategies. Provided supportive therapy.    Interventions: Cognitive Behavioral Therapy and Insight-Oriented  Diagnosis:  F33.1   Plan of Care: Recommend ongoing therapy.   Pt participated in setting  treatment goals.   Pt needs a place to talk about stressors.  Plan to meet every week.  Pt agrees with treatment plan.    Treatment Plan (target date: 03/27/2025) Client Abilities/Strengths  Pt is bright, engaging, and motivated for therapy.   Client Treatment Preferences  Individual therapy.  Client Statement of Needs  Improve coping skills.  Have a place to talk about stressors.   Symptoms  Depressed or irritable mood. Unresolved grief issues.  Problems Addressed  Unipolar Depression Goals 1. Alleviate depressive symptoms and return to previous level of effective functioning. 2. Appropriately grieve the loss in order to normalize mood and to return to previously adaptive level of functioning. Objective Learn and implement behavioral strategies to overcome depression. Target Date: 2025-03-27 Frequency: weekly  Progress: 45 Modality: individual  Related Interventions Engage the client in behavioral activation, increasing his/her activity level and contact with sources of reward, while identifying processes that inhibit activation.  Use behavioral techniques such as instruction, rehearsal, role-playing, role reversal, as needed, to facilitate activity in the client's daily life; reinforce success. Assist the client in developing skills that increase the likelihood of deriving pleasure from behavioral activation (e.g., assertiveness skills, developing an exercise plan, less internal/more external focus, increased social involvement); reinforce success. Objective Identify important people in life, past and present, and describe the quality, good and poor, of those relationships. Target Date: 2025-03-27 Frequency: weekly  Progress: 45 Modality: individual  Related Interventions Conduct Interpersonal Therapy beginning  with the assessment of the client's interpersonal inventory of important past and present relationships; develop a case formulation linking depression to grief, interpersonal  role disputes, role transitions, and/or interpersonal deficits). Objective Learn and implement problem-solving and decision-making skills. Target Date: 2025-03-27 Frequency: weekly  Progress: 45 Modality: individual  Related Interventions Conduct Problem-Solving Therapy using techniques such as psychoeducation, modeling, and role-playing to teach client problem-solving skills (i.e., defining a problem specifically, generating possible solutions, evaluating the pros and cons of each solution, selecting and implementing a plan of action, evaluating the efficacy of the plan, accepting or revising the plan); role-play application of the problem-solving skill to a real life issue. Encourage in the client the development of a positive problem orientation in which problems and solving them are viewed as a natural part of life and not something to be feared, despaired, or avoided. 3. Develop healthy interpersonal relationships that lead to the alleviation and help prevent the relapse of depression. 4. Develop healthy thinking patterns and beliefs about self, others, and the world that lead to the alleviation and help prevent the relapse of depression. 5. Recognize, accept, and cope with feelings of depression. Diagnosis F33.1  Conditions For Discharge Achievement of treatment goals and objectives   Veva Alma, LCSW    "

## 2025-01-16 ENCOUNTER — Ambulatory Visit
Admission: RE | Admit: 2025-01-16 | Discharge: 2025-01-16 | Disposition: A | Discharge status: Home / Self Care | Source: Ambulatory Visit | Attending: Family Medicine | Admitting: Family Medicine

## 2025-01-16 DIAGNOSIS — Z1231 Encounter for screening mammogram for malignant neoplasm of breast: Secondary | ICD-10-CM | POA: Diagnosis present

## 2025-01-17 ENCOUNTER — Ambulatory Visit: Admitting: Psychology

## 2025-01-17 DIAGNOSIS — F331 Major depressive disorder, recurrent, moderate: Secondary | ICD-10-CM | POA: Diagnosis not present

## 2025-01-17 NOTE — Progress Notes (Signed)
 "   [facility] Counselor/Therapist Progress Note  Patient ID: Rachel Torres, MRN: [id number],    Date: 01/17/2025  Time Spent: 11:00am-11:45am   45 minutes   Treatment Type: Individual Therapy  Reported Symptoms: stress, overwhelm  Mental Status Exam: Appearance:  Casual     Behavior: Appropriate  Motor: Normal  Speech/Language:  Normal Rate  Affect: Appropriate  Mood: normal  Thought process: normal  Thought content:   WNL  Sensory/Perceptual disturbances:   WNL  Orientation: oriented to person, place, time/date, and situation  Attention: Good  Concentration: Good  Memory: WNL  Fund of knowledge:  Good  Insight:   Good  Judgment:  Good  Impulse Control: Good   Risk Assessment: Danger to Self:  No Self-injurious Behavior: No Danger to Others: No Duty to Warn:no Physical Aggression / Violence:No  Access to Firearms a concern: No  Gang Involvement:No   Subjective: Pt present for face-to-face individual therapy via video.  Pt consents to telehealth video session and is aware of limitations and benefits of virtual sessions. Location of pt: home Location of therapist: home office.   Pt talked about care giving for Rachel Torres.  Rachel Torres is in the hospital for a UTI.   Pt will go to the [place] today and bring him home.  Rachel Torres has been in the hospital since last week so pt has had a week to herself at home which she really needed.    Pt is feeling the stress of anticipating bringing Rachel Torres home today.   Pt talked about her second grandchild being born recently.  Her name is Rachel Torres.  Pt is very excited to arrange a time to see the baby.  She hopes to go visit Feb. 9th-12th.      Worked on self care strategies. Provided supportive therapy.    Interventions: Cognitive Behavioral Therapy and Insight-Oriented  Diagnosis:  F33.1   Plan of Care: Recommend ongoing therapy.   Pt participated in setting treatment goals.   Pt needs a place to talk about stressors.  Plan to meet every  week.  Pt agrees with treatment plan.    Treatment Plan (target date: 03/27/2025) Client Abilities/Strengths  Pt is bright, engaging, and motivated for therapy.   Client Treatment Preferences  Individual therapy.  Client Statement of Needs  Improve coping skills.  Have a place to talk about stressors.   Symptoms  Depressed or irritable mood. Unresolved grief issues.  Problems Addressed  Unipolar Depression Goals 1. Alleviate depressive symptoms and return to previous level of effective functioning. 2. Appropriately grieve the loss in order to normalize mood and to return to previously adaptive level of functioning. Objective Learn and implement behavioral strategies to overcome depression. Target Date: 2025-03-27 Frequency: weekly  Progress: 45 Modality: individual  Related Interventions Engage the client in behavioral activation, increasing his/her activity level and contact with sources of reward, while identifying processes that inhibit activation.  Use behavioral techniques such as instruction, rehearsal, role-playing, role reversal, as needed, to facilitate activity in the client's daily life; reinforce success. Assist the client in developing skills that increase the likelihood of deriving pleasure from behavioral activation (e.g., assertiveness skills, developing an exercise plan, less internal/more external focus, increased social involvement); reinforce success. Objective Identify important people in life, past and present, and describe the quality, good and poor, of those relationships. Target Date: 2025-03-27 Frequency: weekly  Progress: 45 Modality: individual  Related Interventions Conduct Interpersonal Therapy beginning with the assessment of the client's interpersonal inventory of important past  and present relationships; develop a case formulation linking depression to grief, interpersonal role disputes, role transitions, and/or interpersonal  deficits). Objective Learn and implement problem-solving and decision-making skills. Target Date: 2025-03-27 Frequency: weekly  Progress: 45 Modality: individual  Related Interventions Conduct Problem-Solving Therapy using techniques such as psychoeducation, modeling, and role-playing to teach client problem-solving skills (i.e., defining a problem specifically, generating possible solutions, evaluating the pros and cons of each solution, selecting and implementing a plan of action, evaluating the efficacy of the plan, accepting or revising the plan); role-play application of the problem-solving skill to a real life issue. Encourage in the client the development of a positive problem orientation in which problems and solving them are viewed as a natural part of life and not something to be feared, despaired, or avoided. 3. Develop healthy interpersonal relationships that lead to the alleviation and help prevent the relapse of depression. 4. Develop healthy thinking patterns and beliefs about self, others, and the world that lead to the alleviation and help prevent the relapse of depression. 5. Recognize, accept, and cope with feelings of depression. Diagnosis F33.1  Conditions For Discharge Achievement of treatment goals and objectives   Veva Alma, LCSW   "

## 2025-01-21 ENCOUNTER — Ambulatory Visit: Payer: Self-pay | Admitting: Family Medicine

## 2025-01-22 ENCOUNTER — Ambulatory Visit: Admitting: Family Medicine

## 2025-01-22 ENCOUNTER — Encounter: Payer: Self-pay | Admitting: Family Medicine

## 2025-01-22 VITALS — BP 122/69 | HR 90 | Temp 97.9°F | Ht 64.5 in | Wt 207.4 lb

## 2025-01-22 DIAGNOSIS — E559 Vitamin D deficiency, unspecified: Secondary | ICD-10-CM | POA: Diagnosis not present

## 2025-01-22 DIAGNOSIS — R7303 Prediabetes: Secondary | ICD-10-CM | POA: Diagnosis not present

## 2025-01-22 DIAGNOSIS — E782 Mixed hyperlipidemia: Secondary | ICD-10-CM

## 2025-01-22 DIAGNOSIS — E538 Deficiency of other specified B group vitamins: Secondary | ICD-10-CM | POA: Diagnosis not present

## 2025-01-22 DIAGNOSIS — I10 Essential (primary) hypertension: Secondary | ICD-10-CM | POA: Diagnosis not present

## 2025-01-22 DIAGNOSIS — F418 Other specified anxiety disorders: Secondary | ICD-10-CM

## 2025-01-22 DIAGNOSIS — Z0000 Encounter for general adult medical examination without abnormal findings: Secondary | ICD-10-CM | POA: Diagnosis not present

## 2025-01-22 DIAGNOSIS — K219 Gastro-esophageal reflux disease without esophagitis: Secondary | ICD-10-CM

## 2025-01-22 DIAGNOSIS — M810 Age-related osteoporosis without current pathological fracture: Secondary | ICD-10-CM

## 2025-01-22 MED ORDER — ALPRAZOLAM 0.5 MG PO TABS: 0.5000 mg | ORAL_TABLET | Freq: Every day | ORAL | 0 refills | Status: AC | PRN

## 2025-01-22 MED ORDER — PROMETHAZINE HCL 25 MG PO TABS: 12.5000 mg | ORAL_TABLET | Freq: Three times a day (TID) | ORAL | 0 refills | Status: AC | PRN

## 2025-01-22 NOTE — Assessment & Plan Note (Signed)
 Last vitamin D  Lab Results  Component Value Date   VD25OH 51.85 01/03/2025   Continues current oral supplementation for bone and overall health

## 2025-01-22 NOTE — Patient Instructions (Addendum)
 If you are interested in the new shingles vaccine (Shingrix) - call your local pharmacy to check on coverage and availability   I will reach out to our person for Prolia (osteoporosis treatment)  We will get back to you  Take a look at the handout on the medicine    When you are ready  Add some strength training to your routine, this is important for bone and brain health and can reduce your risk of falls and help your body use insulin  properly and regulate weight  Light weights, exercise bands , and internet videos are a good way to start  Yoga (chair or regular), machines , floor exercises or a gym with machines are also good options   Take care of yourself   Continue counseling   Use sun protection

## 2025-01-22 NOTE — Assessment & Plan Note (Signed)
 Dexa 07/2023  Intol of oral alendronate  Discussed option of Prolia or similar Given info to read  Will reach out to see if affordable   Some falls/high fall risk No fractures   Discussed fall prevention, supplements and exercise for bone density

## 2025-01-22 NOTE — Assessment & Plan Note (Signed)
 Continues omeprazole 40 mg daily  Encouraged to avoid triggers Working on weight loss

## 2025-01-22 NOTE — Assessment & Plan Note (Addendum)
 Reviewed health habits including diet and exercise and skin cancer prevention Reviewed appropriate screening tests for age  Also reviewed health mt list, fam hx and immunization status , as well as social and family history   See HPI Labs reviewed and ordered Health Maintenance  Topic Date Due   COVID-19 Vaccine (5 - 2025-26 season) 08/20/2024   Flu Shot  03/19/2025*   Zoster (Shingles) Vaccine (1 of 2) 04/21/2025*   Medicare Annual Wellness Visit  12/31/2025   Breast Cancer Screening  01/16/2026   DTaP/Tdap/Td vaccine (3 - Tdap) 04/02/2029   Colon Cancer Screening  01/18/2034   Pneumococcal Vaccine for age over 53  Completed   Osteoporosis screening with Bone Density Scan  Completed   Hepatitis C Screening  Completed   Meningitis B Vaccine  Aged Out  *Topic was postponed. The date shown is not the original due date.    Had flu vaccine in sept  Plans to get shingrix in pharmacy soon  Discussed fall prevention, supplements and exercise for bone density   Several falls/no fractures  Considering treatment for osteoporosis PHQ 9 -with treatment , discussed caregiver stress

## 2025-01-22 NOTE — Assessment & Plan Note (Signed)
 Lab Results  Component Value Date   VITAMINB12 678 01/03/2025   Continue oral supplementation

## 2025-01-23 ENCOUNTER — Telehealth: Payer: Self-pay

## 2025-01-23 ENCOUNTER — Other Ambulatory Visit (HOSPITAL_COMMUNITY): Payer: Self-pay

## 2025-01-23 NOTE — Telephone Encounter (Signed)
 Pharmacy Patient Advocate Encounter   Received notification from [facility] that prior authorization for Promethazine  25  is required/requested.   Insurance verification completed.   The patient is insured through [facility].   Per test claim: PA required; PA submitted to above mentioned insurance via Latent Key/confirmation #/EOC [id number] Status is pending

## 2025-01-24 ENCOUNTER — Ambulatory Visit: Admitting: Psychology

## 2025-01-24 DIAGNOSIS — F331 Major depressive disorder, recurrent, moderate: Secondary | ICD-10-CM

## 2025-01-24 NOTE — Progress Notes (Signed)
 "  [facility] Counselor/Therapist Progress Note  Patient ID: Rachel Torres, MRN: [id number],    Date: 01/24/2025  Time Spent: 2:00pm-2:55pm   55 minutes   Treatment Type: Individual Therapy  Reported Symptoms: stress, overwhelm  Mental Status Exam: Appearance:  Casual     Behavior: Appropriate  Motor: Normal  Speech/Language:  Normal Rate  Affect: Appropriate  Mood: normal  Thought process: normal  Thought content:   WNL  Sensory/Perceptual disturbances:   WNL  Orientation: oriented to Torres, place, time/date, and situation  Attention: Good  Concentration: Good  Memory: WNL  Fund of knowledge:  Good  Insight:   Good  Judgment:  Good  Impulse Control: Good   Risk Assessment: Danger to Self:  No Self-injurious Behavior: No Danger to Others: No Duty to Warn:no Physical Aggression / Violence:No  Access to Firearms a concern: No  Gang Involvement:No   Subjective: Pt present for face-to-face individual therapy via video.  Pt consents to telehealth video session and is aware of limitations and benefits of virtual sessions. Location of pt: home Location of therapist: home office.   Pt talked about her health.  She had her physical and it went well.  Her doctor wants her to start exercising.  Addressed how pt can set small attainable goals to get started.  Pt talked about care giving for [name].  [name] has a UTI again so he could end up back in the hospital.   Pt has worked out for [employer] to help with care giving again.  This will be helpful bc [place] can take [name] places.   Pt talked about planning a trip to [place]  next week to see her son and new baby girl.  She is looking forward to the trip and the 4 days away from home.  Pt has arranged for care givers for [name].   Pt feels overwhelmed with everything she has to manage.  Worked on optician, dispensing.   Worked on self care strategies. Provided supportive therapy.    Interventions: Cognitive Behavioral Therapy  and Insight-Oriented  Diagnosis:  F33.1   Plan of Care: Recommend ongoing therapy.   Pt participated in setting treatment goals.   Pt needs a place to talk about stressors.  Plan to meet every week.  Pt agrees with treatment plan.    Treatment Plan (target date: 03/27/2025) Client Abilities/Strengths  Pt is bright, engaging, and motivated for therapy.   Client Treatment Preferences  Individual therapy.  Client Statement of Needs  Improve coping skills.  Have a place to talk about stressors.   Symptoms  Depressed or irritable mood. Unresolved grief issues.  Problems Addressed  Unipolar Depression Goals 1. Alleviate depressive symptoms and return to previous level of effective functioning. 2. Appropriately grieve the loss in order to normalize mood and to return to previously adaptive level of functioning. Objective Learn and implement behavioral strategies to overcome depression. Target Date: 2025-03-27 Frequency: weekly  Progress: 45 Modality: individual  Related Interventions Engage the client in behavioral activation, increasing his/her activity level and contact with sources of reward, while identifying processes that inhibit activation.  Use behavioral techniques such as instruction, rehearsal, role-playing, role reversal, as needed, to facilitate activity in the client's daily life; reinforce success. Assist the client in developing skills that increase the likelihood of deriving pleasure from behavioral activation (e.g., assertiveness skills, developing an exercise plan, less internal/more external focus, increased social involvement); reinforce success. Objective Identify important people in life, past and present, and describe the  quality, good and poor, of those relationships. Target Date: 2025-03-27 Frequency: weekly  Progress: 45 Modality: individual  Related Interventions Conduct Interpersonal Therapy beginning with the assessment of the client's interpersonal  inventory of important past and present relationships; develop a case formulation linking depression to grief, interpersonal role disputes, role transitions, and/or interpersonal deficits). Objective Learn and implement problem-solving and decision-making skills. Target Date: 2025-03-27 Frequency: weekly  Progress: 45 Modality: individual  Related Interventions Conduct Problem-Solving Therapy using techniques such as psychoeducation, modeling, and role-playing to teach client problem-solving skills (i.e., defining a problem specifically, generating possible solutions, evaluating the pros and cons of each solution, selecting and implementing a plan of action, evaluating the efficacy of the plan, accepting or revising the plan); role-play application of the problem-solving skill to a real life issue. Encourage in the client the development of a positive problem orientation in which problems and solving them are viewed as a natural part of life and not something to be feared, despaired, or avoided. 3. Develop healthy interpersonal relationships that lead to the alleviation and help prevent the relapse of depression. 4. Develop healthy thinking patterns and beliefs about self, others, and the world that lead to the alleviation and help prevent the relapse of depression. 5. Recognize, accept, and cope with feelings of depression. Diagnosis F33.1  Conditions For Discharge Achievement of treatment goals and objectives   Veva Alma, LCSW   "

## 2025-01-25 ENCOUNTER — Other Ambulatory Visit: Payer: Self-pay | Admitting: *Deleted

## 2025-01-25 DIAGNOSIS — J4520 Mild intermittent asthma, uncomplicated: Secondary | ICD-10-CM

## 2025-01-25 DIAGNOSIS — F418 Other specified anxiety disorders: Secondary | ICD-10-CM

## 2025-01-25 MED ORDER — CYCLOBENZAPRINE HCL 10 MG PO TABS: 5.0000 mg | ORAL_TABLET | Freq: Three times a day (TID) | ORAL | 2 refills | Status: AC | PRN

## 2025-01-25 MED ORDER — OMEPRAZOLE 40 MG PO CPDR: 40.0000 mg | DELAYED_RELEASE_CAPSULE | Freq: Every day | ORAL | 3 refills | Status: AC

## 2025-01-25 MED ORDER — VENLAFAXINE HCL ER 150 MG PO CP24: ORAL_CAPSULE | ORAL | 2 refills | Status: AC

## 2025-01-25 MED ORDER — ALBUTEROL SULFATE HFA 108 (90 BASE) MCG/ACT IN AERS: 1.0000 | INHALATION_SPRAY | Freq: Four times a day (QID) | RESPIRATORY_TRACT | 3 refills | Status: AC | PRN

## 2025-01-25 MED ORDER — VENLAFAXINE HCL ER 37.5 MG PO CP24: ORAL_CAPSULE | ORAL | 2 refills | Status: AC

## 2025-01-25 MED ORDER — FLUTICASONE PROPIONATE 50 MCG/ACT NA SUSP: 2.0000 | Freq: Every day | NASAL | 3 refills | Status: AC | PRN

## 2025-01-25 NOTE — Telephone Encounter (Signed)
 Reason for CRM: Patient is requesting that all medications listed below needs to be sent to [facility] [address] - [place], [place] - [address] AT [address] with the PA stating that they are medically necessary. Patient has a new insurance and it is requiring that this info be sent over   carvedilol  (COREG ) 12.5 MG tablet  ELIQUIS  5 MG TABS tablet  ezetimibe  (ZETIA ) 10 MG tablet  dapagliflozin  propanediol (FARXIGA ) 10 MG TABS tablet   furosemide  (LASIX ) 20 MG tablet   lisinopril  (ZESTRIL ) 5 MG tablet  nitroGLYCERIN  (NITROSTAT ) 0.4 MG SL tablet   spironolactone  (ALDACTONE ) 25 MG tablet

## 2025-01-25 NOTE — Telephone Encounter (Signed)
 Does she mean prior authorization? For all? Sorry- confused by message Thanks

## 2025-01-25 NOTE — Telephone Encounter (Signed)
 Copied from CRM [id number]. Topic: Clinical - Medication Prior Auth >> Jan 25, 2025 11:43 AM Charolett L wrote: Reason for CRM: Patient is requesting that all medications listed below needs to be sent to [facility] [address] - [place], [place] - [address] with the PA stating that they are medically necessary. Patient has a new insurance and it is requiring that this info be sent over  albuterol  (VENTOLIN  HFA) 108 (90 Base) MCG/ACT inhaler ALPRAZolam  (XANAX ) 0.5 MG tablet cyclobenzaprine  (FLEXERIL ) 10 MG tablet fluticasone  (FLONASE ) 50 MCG/ACT nasal spray omeprazole  (PRILOSEC) 40 MG capsule venlafaxine  XR (EFFEXOR -XR) 150 MG 24 hr capsule venlafaxine  XR (EFFEXOR -XR) 37.5 MG 24 hr capsule

## 2025-01-25 NOTE — Telephone Encounter (Signed)
 Last filled on 11/21/24 #60 tab/ 1 refill  Pt recently had CPE and next CPE scheduled 01/10/26

## 2025-01-30 ENCOUNTER — Ambulatory Visit: Admitting: Psychology

## 2025-02-05 ENCOUNTER — Ambulatory Visit: Admitting: Psychology

## 2025-02-14 ENCOUNTER — Ambulatory Visit: Admitting: Psychology

## 2025-02-19 ENCOUNTER — Ambulatory Visit: Admitting: Psychology

## 2025-02-27 ENCOUNTER — Ambulatory Visit: Admitting: Psychology

## 2025-03-05 ENCOUNTER — Ambulatory Visit: Admitting: Psychology

## 2025-03-12 ENCOUNTER — Ambulatory Visit: Admitting: Psychology

## 2025-03-14 ENCOUNTER — Ambulatory Visit: Admitting: Dermatology

## 2025-03-19 ENCOUNTER — Ambulatory Visit: Admitting: Psychology

## 2025-06-12 ENCOUNTER — Ambulatory Visit: Admitting: Cardiology

## 2026-01-10 ENCOUNTER — Encounter: Admitting: Family Medicine

## 2026-01-10 ENCOUNTER — Ambulatory Visit
